# Patient Record
Sex: Male | Born: 1945
Health system: Southern US, Community
[De-identification: ages and names within clinical notes are randomized; demographics above are authoritative.]

## PROBLEM LIST (undated history)

## (undated) ENCOUNTER — Emergency Department (HOSPITAL_BASED_OUTPATIENT_CLINIC_OR_DEPARTMENT_OTHER): Admission: EM | Payer: Medicare HMO | Source: Home / Self Care

## (undated) DIAGNOSIS — K635 Polyp of colon: Secondary | ICD-10-CM

## (undated) DIAGNOSIS — I219 Acute myocardial infarction, unspecified: Secondary | ICD-10-CM

## (undated) DIAGNOSIS — K859 Acute pancreatitis without necrosis or infection, unspecified: Secondary | ICD-10-CM

## (undated) DIAGNOSIS — E039 Hypothyroidism, unspecified: Secondary | ICD-10-CM

## (undated) DIAGNOSIS — D649 Anemia, unspecified: Secondary | ICD-10-CM

## (undated) DIAGNOSIS — H269 Unspecified cataract: Secondary | ICD-10-CM

## (undated) DIAGNOSIS — I10 Essential (primary) hypertension: Secondary | ICD-10-CM

## (undated) DIAGNOSIS — K219 Gastro-esophageal reflux disease without esophagitis: Secondary | ICD-10-CM

## (undated) DIAGNOSIS — E785 Hyperlipidemia, unspecified: Secondary | ICD-10-CM

## (undated) DIAGNOSIS — K449 Diaphragmatic hernia without obstruction or gangrene: Secondary | ICD-10-CM

## (undated) DIAGNOSIS — K649 Unspecified hemorrhoids: Secondary | ICD-10-CM

## (undated) DIAGNOSIS — K8020 Calculus of gallbladder without cholecystitis without obstruction: Secondary | ICD-10-CM

## (undated) DIAGNOSIS — K5790 Diverticulosis of intestine, part unspecified, without perforation or abscess without bleeding: Secondary | ICD-10-CM

## (undated) DIAGNOSIS — R011 Cardiac murmur, unspecified: Secondary | ICD-10-CM

## (undated) DIAGNOSIS — C799 Secondary malignant neoplasm of unspecified site: Secondary | ICD-10-CM

## (undated) HISTORY — DX: Secondary malignant neoplasm of unspecified site: C79.9

## (undated) HISTORY — PX: TUMOR REMOVAL: SHX12

## (undated) HISTORY — PX: SMALL INTESTINE SURGERY: SHX150

## (undated) HISTORY — DX: Acute myocardial infarction, unspecified: I21.9

## (undated) HISTORY — DX: Acute pancreatitis without necrosis or infection, unspecified: K85.90

## (undated) HISTORY — PX: CATARACT EXTRACTION W/ INTRAOCULAR LENS  IMPLANT, BILATERAL: SHX1307

## (undated) HISTORY — DX: Gastro-esophageal reflux disease without esophagitis: K21.9

## (undated) HISTORY — DX: Unspecified cataract: H26.9

## (undated) HISTORY — DX: Unspecified hemorrhoids: K64.9

## (undated) HISTORY — DX: Hyperlipidemia, unspecified: E78.5

## (undated) HISTORY — DX: Diaphragmatic hernia without obstruction or gangrene: K44.9

## (undated) HISTORY — DX: Polyp of colon: K63.5

## (undated) HISTORY — DX: Calculus of gallbladder without cholecystitis without obstruction: K80.20

## (undated) HISTORY — DX: Hypothyroidism, unspecified: E03.9

## (undated) HISTORY — DX: Diverticulosis of intestine, part unspecified, without perforation or abscess without bleeding: K57.90

## (undated) HISTORY — DX: Essential (primary) hypertension: I10

## (undated) HISTORY — PX: RADIOACTIVE SEED IMPLANT: SHX5150

---
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
## 1996-11-01 DIAGNOSIS — K859 Acute pancreatitis without necrosis or infection, unspecified: Secondary | ICD-10-CM

## 1996-11-01 HISTORY — DX: Acute pancreatitis without necrosis or infection, unspecified: K85.90

## 1997-11-01 HISTORY — PX: LAPAROSCOPIC CHOLECYSTECTOMY: SUR755

## 2002-12-20 ENCOUNTER — Encounter: Admission: RE | Admit: 2002-12-20 | Discharge: 2002-12-20 | Payer: Self-pay | Admitting: Otolaryngology

## 2002-12-20 ENCOUNTER — Encounter: Payer: Self-pay | Admitting: Otolaryngology

## 2005-01-18 ENCOUNTER — Ambulatory Visit: Payer: Self-pay | Admitting: Internal Medicine

## 2005-01-25 ENCOUNTER — Ambulatory Visit: Payer: Self-pay | Admitting: Internal Medicine

## 2005-02-17 ENCOUNTER — Ambulatory Visit: Payer: Self-pay | Admitting: Internal Medicine

## 2005-03-09 ENCOUNTER — Ambulatory Visit: Payer: Self-pay | Admitting: Internal Medicine

## 2005-07-28 ENCOUNTER — Ambulatory Visit: Payer: Self-pay | Admitting: Internal Medicine

## 2005-12-15 ENCOUNTER — Ambulatory Visit: Payer: Self-pay | Admitting: Internal Medicine

## 2006-01-10 ENCOUNTER — Ambulatory Visit: Payer: Self-pay | Admitting: Internal Medicine

## 2006-01-12 ENCOUNTER — Ambulatory Visit: Payer: Self-pay | Admitting: Internal Medicine

## 2006-01-27 ENCOUNTER — Ambulatory Visit: Payer: Self-pay | Admitting: Internal Medicine

## 2006-02-28 ENCOUNTER — Ambulatory Visit: Payer: Self-pay | Admitting: Internal Medicine

## 2006-08-04 ENCOUNTER — Ambulatory Visit: Payer: Self-pay | Admitting: Internal Medicine

## 2006-08-05 ENCOUNTER — Ambulatory Visit: Payer: Self-pay | Admitting: Internal Medicine

## 2006-09-19 ENCOUNTER — Emergency Department (HOSPITAL_COMMUNITY): Admission: EM | Admit: 2006-09-19 | Discharge: 2006-09-19 | Payer: Self-pay | Admitting: Family Medicine

## 2006-09-30 ENCOUNTER — Ambulatory Visit: Payer: Self-pay | Admitting: Internal Medicine

## 2006-10-19 ENCOUNTER — Ambulatory Visit: Payer: Self-pay

## 2007-04-24 ENCOUNTER — Ambulatory Visit: Payer: Self-pay | Admitting: Internal Medicine

## 2007-07-17 ENCOUNTER — Ambulatory Visit: Payer: Self-pay | Admitting: Internal Medicine

## 2007-09-15 ENCOUNTER — Encounter: Payer: Self-pay | Admitting: Internal Medicine

## 2007-09-15 DIAGNOSIS — K219 Gastro-esophageal reflux disease without esophagitis: Secondary | ICD-10-CM | POA: Insufficient documentation

## 2007-09-15 DIAGNOSIS — I10 Essential (primary) hypertension: Secondary | ICD-10-CM | POA: Insufficient documentation

## 2007-09-15 DIAGNOSIS — R011 Cardiac murmur, unspecified: Secondary | ICD-10-CM | POA: Insufficient documentation

## 2007-09-15 DIAGNOSIS — R5381 Other malaise: Secondary | ICD-10-CM | POA: Insufficient documentation

## 2007-09-15 DIAGNOSIS — K861 Other chronic pancreatitis: Secondary | ICD-10-CM | POA: Insufficient documentation

## 2007-09-15 DIAGNOSIS — R0609 Other forms of dyspnea: Secondary | ICD-10-CM | POA: Insufficient documentation

## 2007-09-15 DIAGNOSIS — R5383 Other fatigue: Secondary | ICD-10-CM

## 2007-09-15 DIAGNOSIS — R0989 Other specified symptoms and signs involving the circulatory and respiratory systems: Secondary | ICD-10-CM

## 2007-12-08 ENCOUNTER — Ambulatory Visit: Payer: Self-pay | Admitting: Internal Medicine

## 2007-12-11 LAB — CONVERTED CEMR LAB
Alkaline Phosphatase: 42 units/L (ref 39–117)
BUN: 12 mg/dL (ref 6–23)
Basophils Relative: 0.8 % (ref 0.0–1.0)
Bilirubin Urine: NEGATIVE
Bilirubin, Direct: 0.2 mg/dL (ref 0.0–0.3)
CO2: 33 meq/L — ABNORMAL HIGH (ref 19–32)
Cholesterol: 206 mg/dL (ref 0–200)
Direct LDL: 147.6 mg/dL
Eosinophils Absolute: 0.3 10*3/uL (ref 0.0–0.6)
GFR calc Af Amer: 111 mL/min
GFR calc non Af Amer: 91 mL/min
HDL: 33.2 mg/dL — ABNORMAL LOW (ref >39.0)
Hemoglobin: 14.7 g/dL (ref 13.0–17.0)
Leukocytes, UA: NEGATIVE
Lymphocytes Relative: 26 % (ref 12.0–46.0)
MCHC: 33.1 g/dL (ref 30.0–36.0)
MCV: 85.6 fL (ref 78.0–100.0)
Monocytes Absolute: 0.4 10*3/uL (ref 0.2–0.7)
Monocytes Relative: 6.9 % (ref 3.0–11.0)
Neutro Abs: 3.9 10*3/uL (ref 1.4–7.7)
Platelets: 207 10*3/uL (ref 150–400)
Potassium: 4.2 meq/L (ref 3.5–5.1)
Specific Gravity, Urine: 1.015 (ref 1.000–1.03)
TSH: 6.24 microintl units/mL — ABNORMAL HIGH (ref 0.35–5.50)
Total Protein: 7 g/dL (ref 6.0–8.3)
Triglycerides: 157 mg/dL — ABNORMAL HIGH (ref 0–149)
Urine Glucose: NEGATIVE mg/dL

## 2007-12-13 ENCOUNTER — Ambulatory Visit: Payer: Self-pay | Admitting: Internal Medicine

## 2007-12-13 DIAGNOSIS — R143 Flatulence: Secondary | ICD-10-CM

## 2007-12-13 DIAGNOSIS — E039 Hypothyroidism, unspecified: Secondary | ICD-10-CM | POA: Insufficient documentation

## 2007-12-13 DIAGNOSIS — R142 Eructation: Secondary | ICD-10-CM | POA: Insufficient documentation

## 2007-12-13 DIAGNOSIS — K649 Unspecified hemorrhoids: Secondary | ICD-10-CM | POA: Insufficient documentation

## 2007-12-13 DIAGNOSIS — Z860100 Personal history of colon polyps, unspecified: Secondary | ICD-10-CM | POA: Insufficient documentation

## 2007-12-13 DIAGNOSIS — R141 Gas pain: Secondary | ICD-10-CM | POA: Insufficient documentation

## 2007-12-21 ENCOUNTER — Telehealth: Payer: Self-pay | Admitting: Internal Medicine

## 2008-01-08 ENCOUNTER — Telehealth: Payer: Self-pay | Admitting: Internal Medicine

## 2008-01-08 ENCOUNTER — Encounter: Payer: Self-pay | Admitting: Internal Medicine

## 2008-01-12 ENCOUNTER — Encounter: Payer: Self-pay | Admitting: Internal Medicine

## 2008-03-11 ENCOUNTER — Ambulatory Visit: Payer: Self-pay | Admitting: Internal Medicine

## 2008-03-11 LAB — CONVERTED CEMR LAB: TSH: 4.24 microintl units/mL (ref 0.35–5.50)

## 2008-03-12 ENCOUNTER — Ambulatory Visit: Payer: Self-pay | Admitting: Internal Medicine

## 2008-06-05 ENCOUNTER — Ambulatory Visit: Payer: Self-pay | Admitting: Internal Medicine

## 2008-06-05 DIAGNOSIS — R21 Rash and other nonspecific skin eruption: Secondary | ICD-10-CM | POA: Insufficient documentation

## 2008-07-10 ENCOUNTER — Encounter: Payer: Self-pay | Admitting: Internal Medicine

## 2008-07-16 ENCOUNTER — Encounter: Payer: Self-pay | Admitting: Internal Medicine

## 2008-07-31 ENCOUNTER — Encounter: Payer: Self-pay | Admitting: Internal Medicine

## 2008-08-12 ENCOUNTER — Ambulatory Visit: Payer: Self-pay | Admitting: Internal Medicine

## 2008-08-28 ENCOUNTER — Encounter: Payer: Self-pay | Admitting: Internal Medicine

## 2008-09-06 ENCOUNTER — Telehealth: Payer: Self-pay | Admitting: Internal Medicine

## 2008-10-02 ENCOUNTER — Ambulatory Visit: Payer: Self-pay | Admitting: Internal Medicine

## 2008-10-02 DIAGNOSIS — R109 Unspecified abdominal pain: Secondary | ICD-10-CM | POA: Insufficient documentation

## 2008-10-04 ENCOUNTER — Ambulatory Visit: Payer: Self-pay | Admitting: Internal Medicine

## 2008-10-04 LAB — CONVERTED CEMR LAB
Albumin: 3.8 g/dL (ref 3.5–5.2)
BUN: 16 mg/dL (ref 6–23)
Bacteria, UA: NEGATIVE
Calcium: 9.6 mg/dL (ref 8.4–10.5)
Creatinine, Ser: 0.8 mg/dL (ref 0.4–1.5)
Crystals: NEGATIVE
GFR calc Af Amer: 126 mL/min
Glucose, Bld: 116 mg/dL — ABNORMAL HIGH (ref 70–99)
Hemoglobin, Urine: NEGATIVE
Ketones, ur: 15 mg/dL — AB
Lipase: 25 units/L (ref 11.0–59.0)
Sed Rate: 29 mm/hr — ABNORMAL HIGH (ref 0–16)
Total Protein: 7.8 g/dL (ref 6.0–8.3)
Urine Glucose: NEGATIVE mg/dL
Urobilinogen, UA: 0.2 (ref 0.0–1.0)

## 2008-10-07 ENCOUNTER — Encounter (INDEPENDENT_AMBULATORY_CARE_PROVIDER_SITE_OTHER): Payer: Self-pay | Admitting: *Deleted

## 2008-10-08 ENCOUNTER — Encounter: Admission: RE | Admit: 2008-10-08 | Discharge: 2008-10-08 | Payer: Self-pay | Admitting: Internal Medicine

## 2008-11-01 DIAGNOSIS — C799 Secondary malignant neoplasm of unspecified site: Secondary | ICD-10-CM

## 2008-11-01 HISTORY — DX: Secondary malignant neoplasm of unspecified site: C79.9

## 2008-11-01 HISTORY — PX: LIVER BIOPSY: SHX301

## 2008-11-08 DIAGNOSIS — K5730 Diverticulosis of large intestine without perforation or abscess without bleeding: Secondary | ICD-10-CM | POA: Insufficient documentation

## 2008-11-11 ENCOUNTER — Ambulatory Visit: Payer: Self-pay | Admitting: Internal Medicine

## 2008-11-11 DIAGNOSIS — R1013 Epigastric pain: Secondary | ICD-10-CM | POA: Insufficient documentation

## 2008-11-13 ENCOUNTER — Telehealth (INDEPENDENT_AMBULATORY_CARE_PROVIDER_SITE_OTHER): Payer: Self-pay | Admitting: *Deleted

## 2008-11-13 ENCOUNTER — Ambulatory Visit (HOSPITAL_COMMUNITY): Admission: RE | Admit: 2008-11-13 | Discharge: 2008-11-13 | Payer: Self-pay | Admitting: Internal Medicine

## 2008-11-13 IMAGING — US US ABDOMEN COMPLETE
1 series · 13 of 25 positions shown · non-contrast
Comparison: None

CLINICAL DATA: Epigastric abdominal pain.  History of
cholecystectomy and pancreatic pseudocyst.

ABDOMEN ULTRASOUND
TECHNIQUE: Complete abdominal ultrasound examination was performed
including evaluation of the liver, gallbladder, bile ducts,
pancreas, kidneys, spleen, IVC, and abdominal aorta.

[Series 1: us abdomen complete · 0.30mm/px · 13 of 88 slices shown]
[im 1/88]
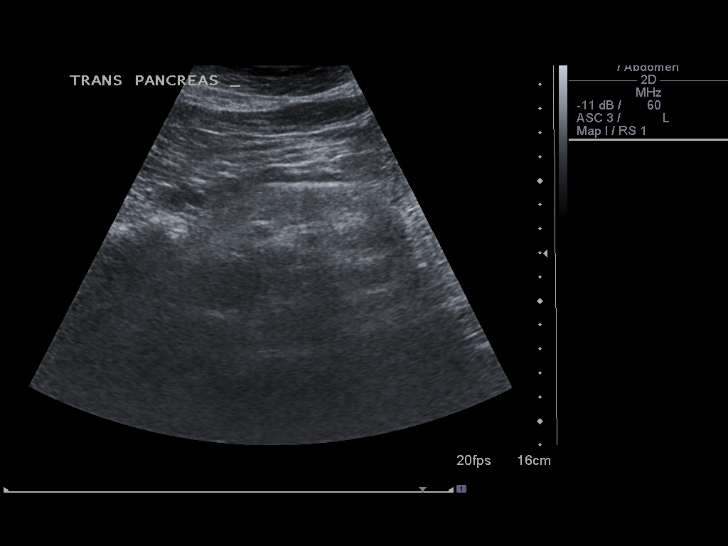
[im 8/88]
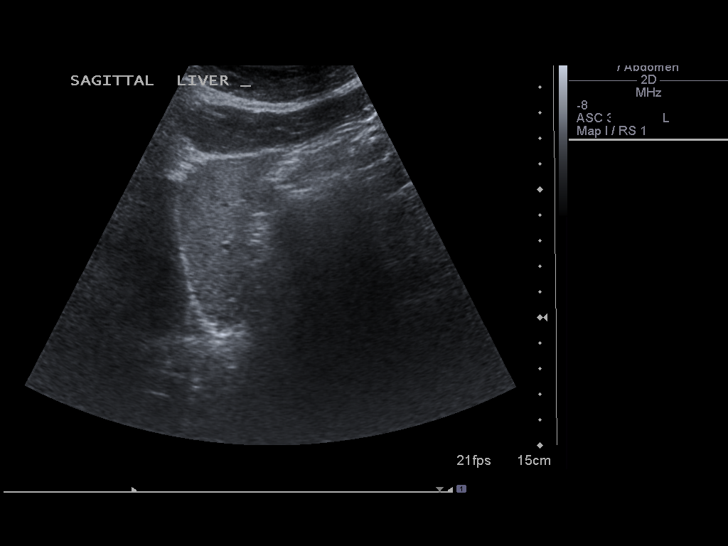
[im 15/88]
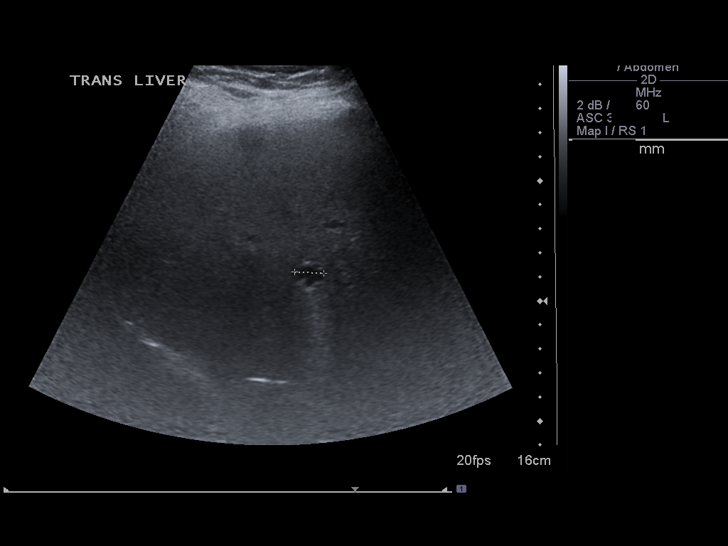
[im 22/88]
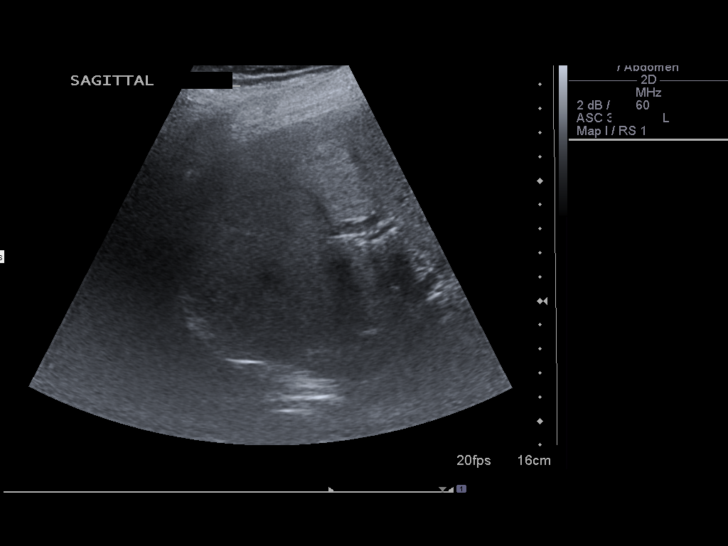
[im 30/88]
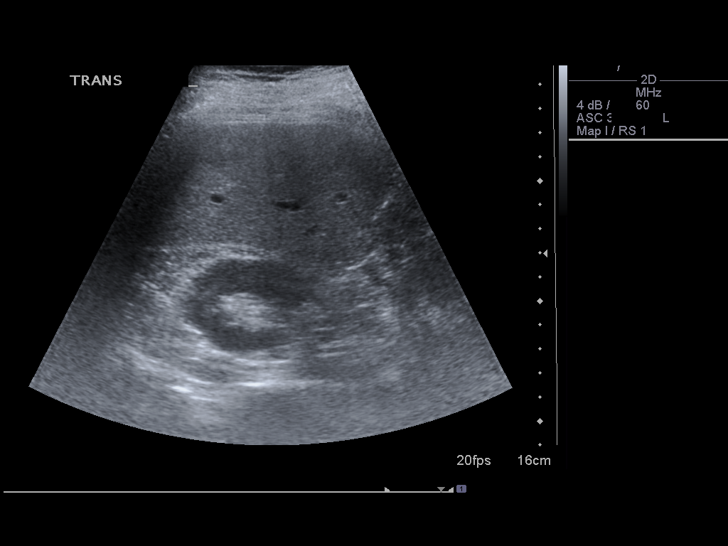
[im 37/88]
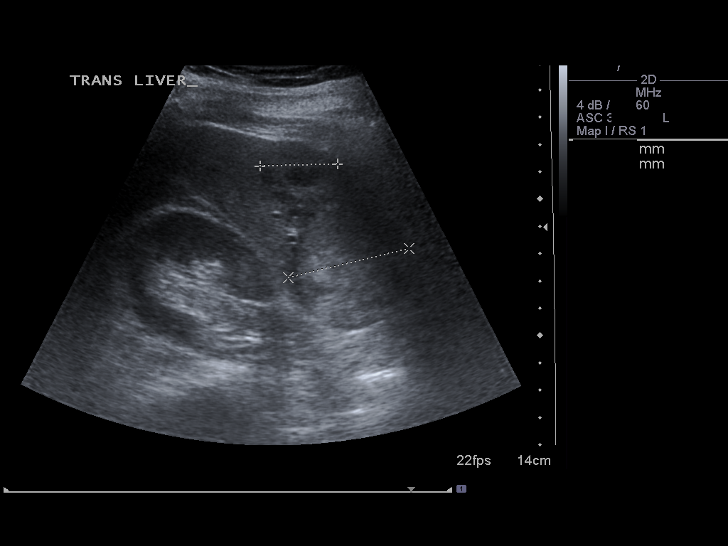
[im 44/88]
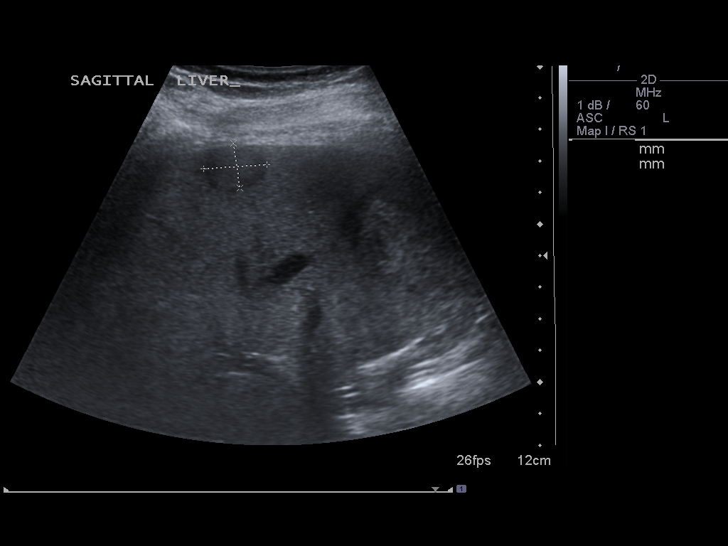
[im 51/88]
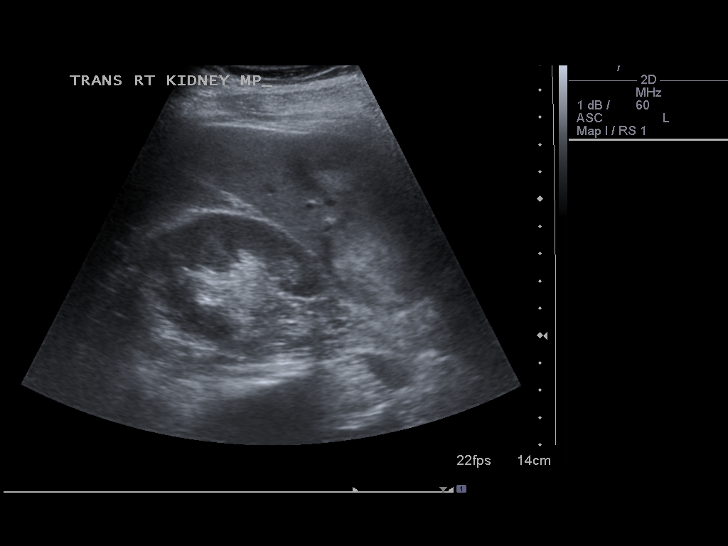
[im 59/88]
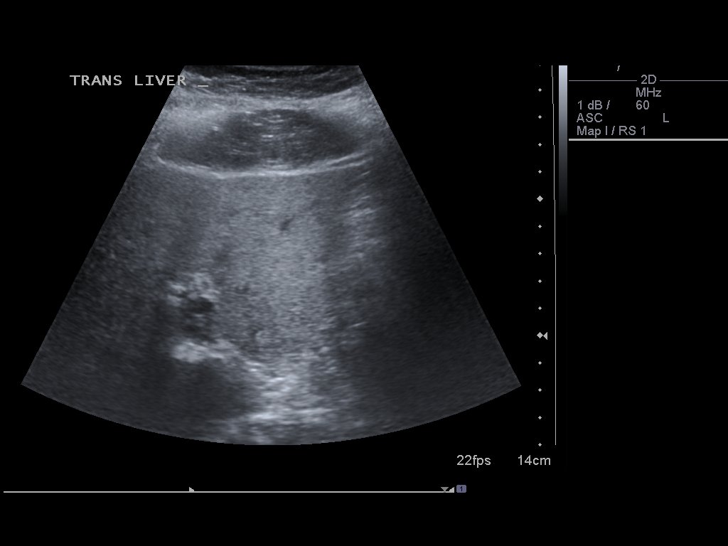
[im 66/88]
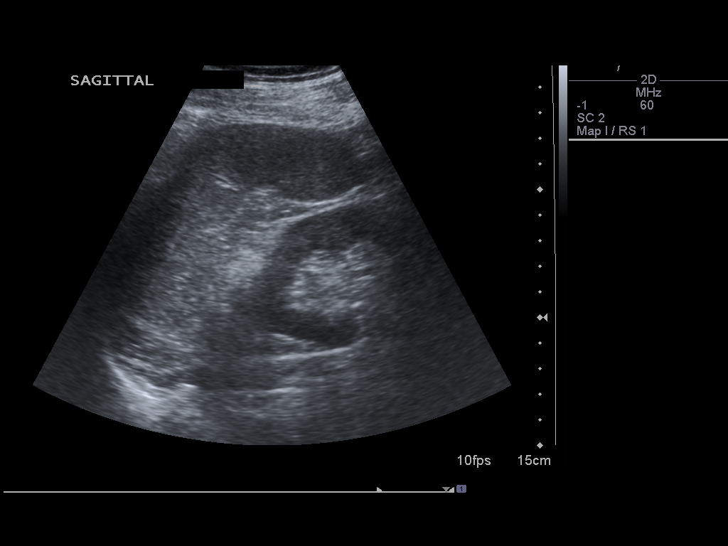
[im 73/88]
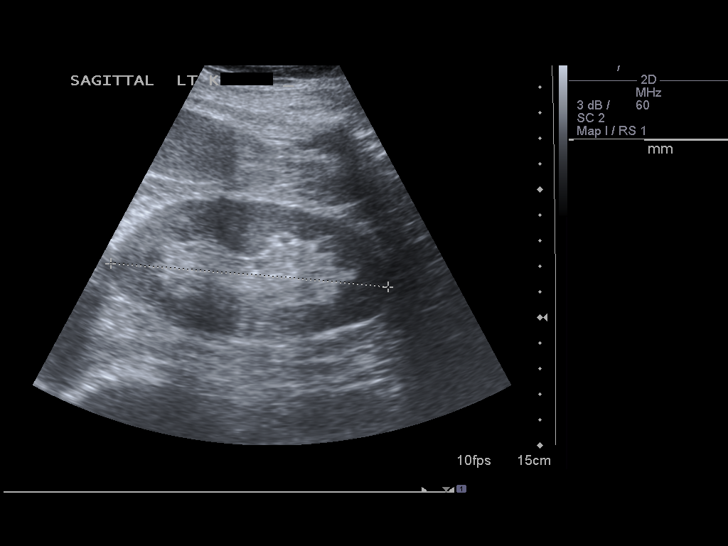
[im 80/88]
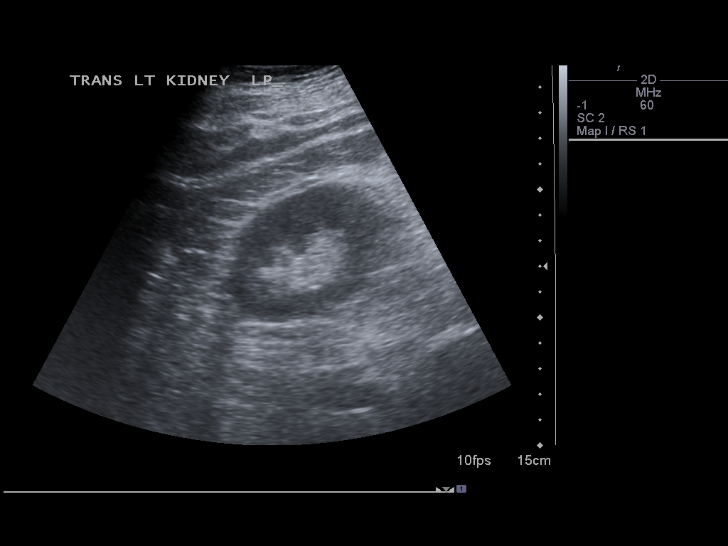
[im 88/88]
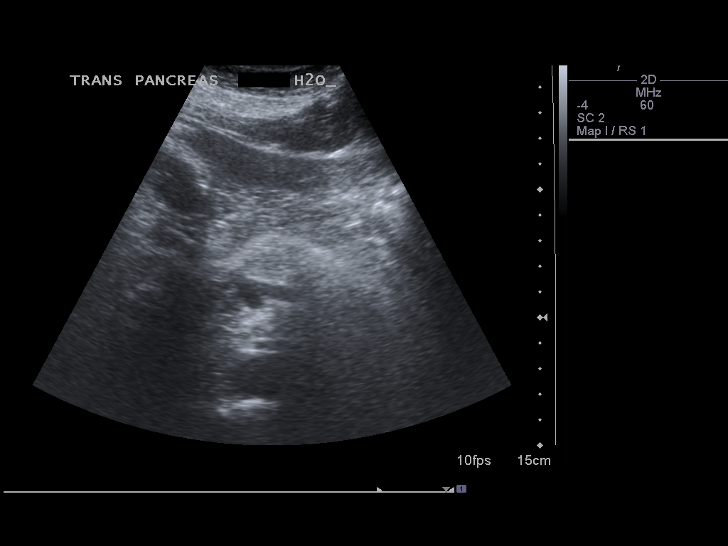

[13 of 25 positions shown; findings below may reference images not displayed]

FINDINGS: Gallbladder:  Surgically removed.

Common bile duct:  Normal caliber of 5 mm

Liver:  Multiple solid masses are present in the liver.  At least
three are definable and measurable by ultrasound.  The largest lies
in the inferior right lobe of the liver and measures 4.9 x 4.2 x
4.6 cm.  An adjacent lesion measures approximately 3.5 x 2.4 x
cm.  A third solid tumor is located near the junction of left and
right lobes anteriorly and measures approximately 2.0 x 1.4 x
cm.  A single small septated cyst is identified in the deep right
lobe measuring 1.2 x 1.0 x 1.2 cm.

IVC:  Normally patent

Pancreas:  Very limited visualization by ultrasound.  No obvious
pancreatic masses or fluid collections.

Spleen:  Normal by ultrasound.  Estimated length of 9 cm.

Kidneys:  The right kidney measures 10.1 cm and the left kidney
10.9 cm.  Both have a normal sonographic appearance.

Abdominal aorta:  Normal caliber.
IMPRESSION: At least three solid masses are identified in the liver as above.
Findings are most concerning for metastatic disease.  Very limited
visualization of the pancreas.  Findings were directly communicated
to Dr. TOLLEIV nurse at the time of interpretation.

## 2008-11-14 ENCOUNTER — Encounter: Payer: Self-pay | Admitting: Internal Medicine

## 2008-11-14 DIAGNOSIS — R933 Abnormal findings on diagnostic imaging of other parts of digestive tract: Secondary | ICD-10-CM | POA: Insufficient documentation

## 2008-11-15 ENCOUNTER — Telehealth: Payer: Self-pay | Admitting: Internal Medicine

## 2008-11-18 ENCOUNTER — Ambulatory Visit (HOSPITAL_COMMUNITY): Admission: RE | Admit: 2008-11-18 | Discharge: 2008-11-18 | Payer: Self-pay | Admitting: Internal Medicine

## 2008-11-18 IMAGING — CT CT PELVIS W/ CM
2 of 5 series · 16 of 46 positions shown, 18 images · IV contrast (agent unspecified)
Comparison: Liver sonogram dated [DATE]

CT CHEST

CLINICAL DATA: Abnormal sonogram of the liver

CT CHEST, ABDOMEN AND PELVIS WITH CONTRAST
TECHNIQUE: Multidetector CT imaging of the chest, abdomen and
pelvis was performed following the standard protocol during bolus
administration of intravenous contrast.
Contrast: 100 ml [UA]

[Series 2: cap 5.0 b40f · axial · 0.75mm/px · z∈[-623,-63]mm · 13 of 128 slices shown, 15 images]
[im 8/128  soft-tissue]
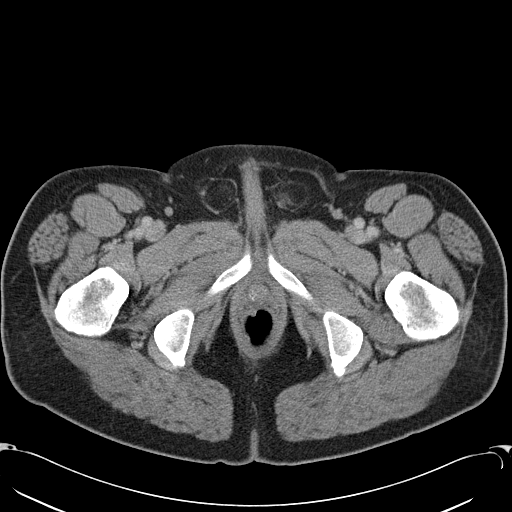
[im 8/128  bone]
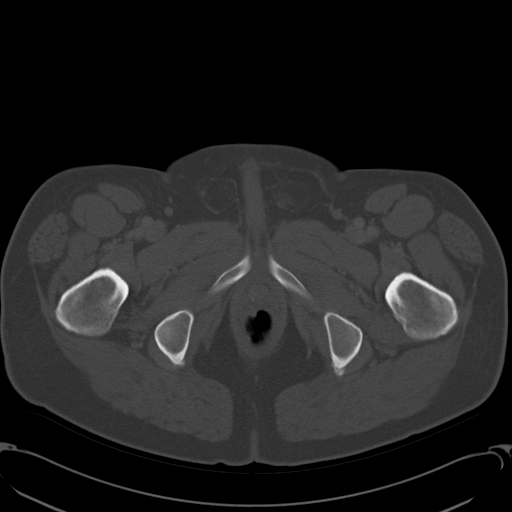
[im 16/128  soft-tissue]
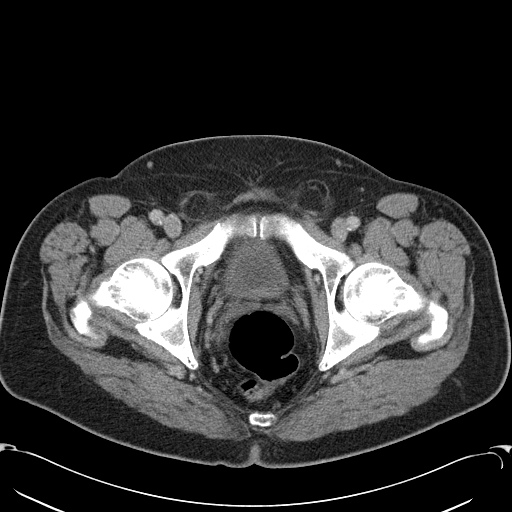
[im 24/128  soft-tissue]
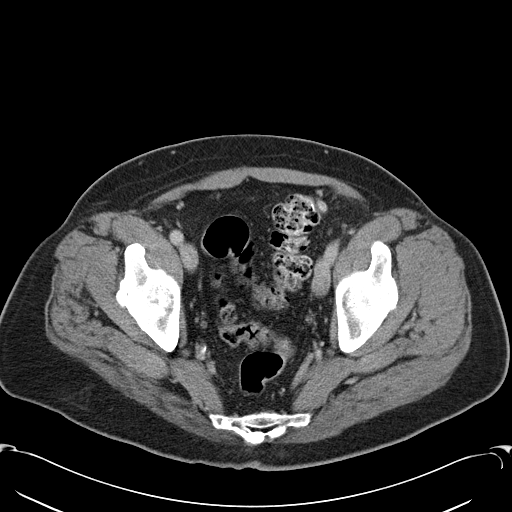
[im 40/128  soft-tissue]
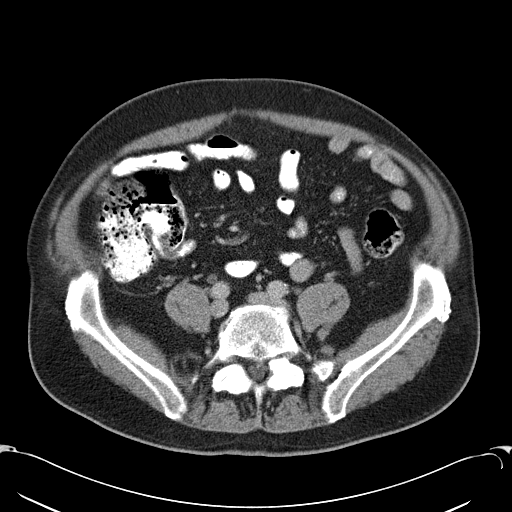
[im 48/128  soft-tissue]
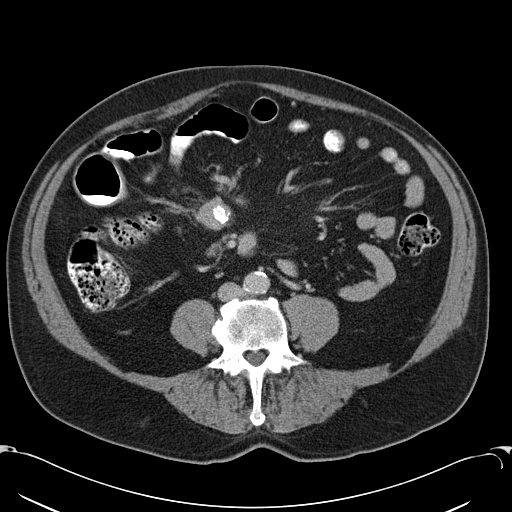
[im 56/128  soft-tissue]
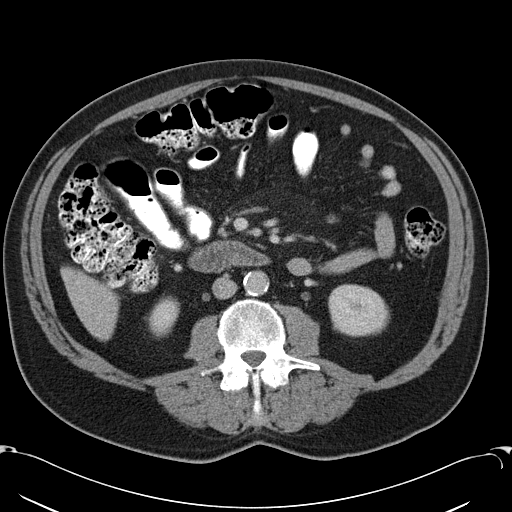
[im 64/128  soft-tissue]
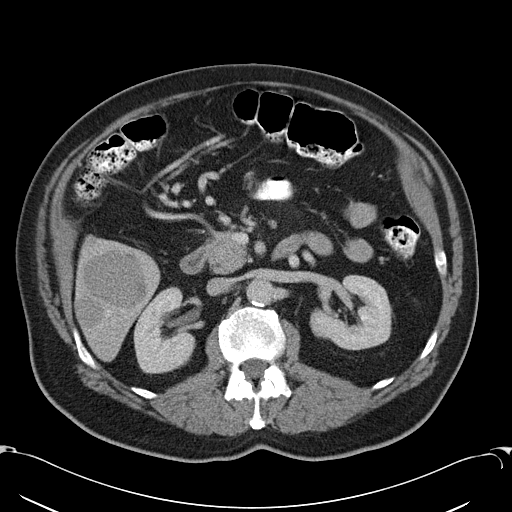
[im 72/128  soft-tissue]
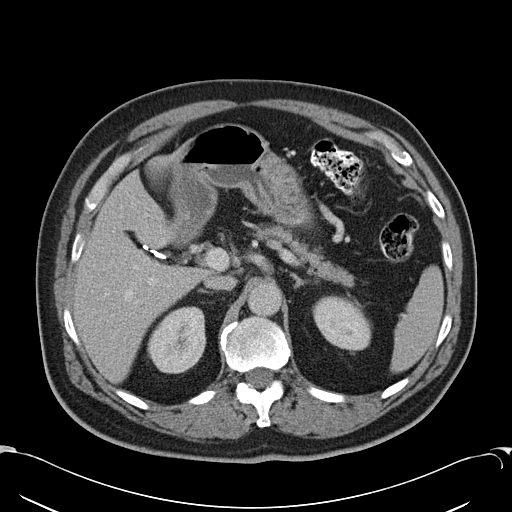
[im 80/128  soft-tissue]
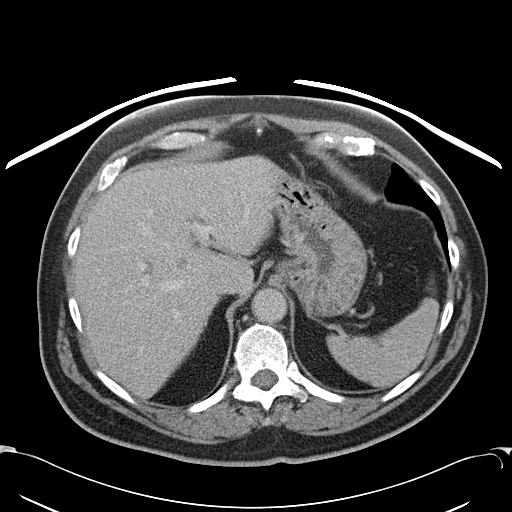
[im 80/128  bone]
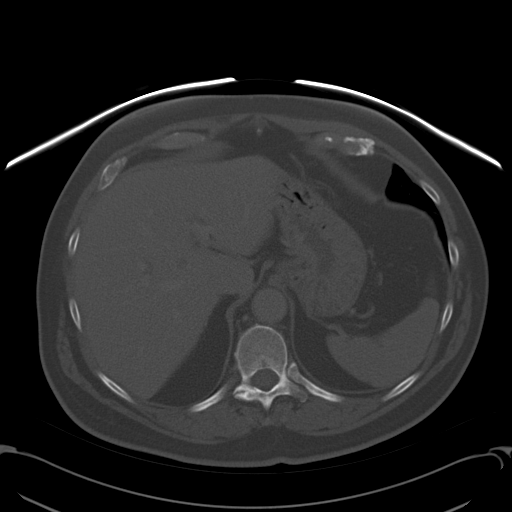
[im 88/128  soft-tissue]
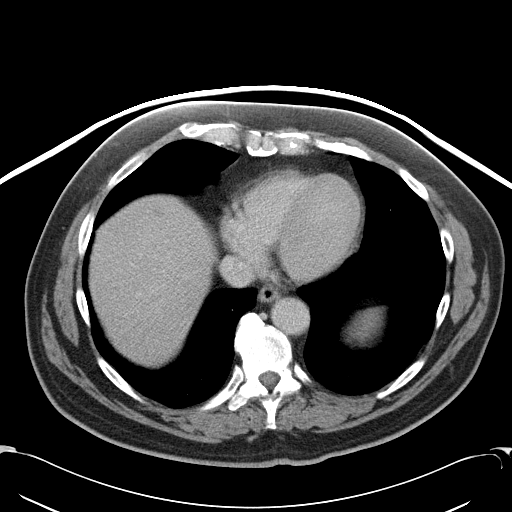
[im 104/128  soft-tissue]
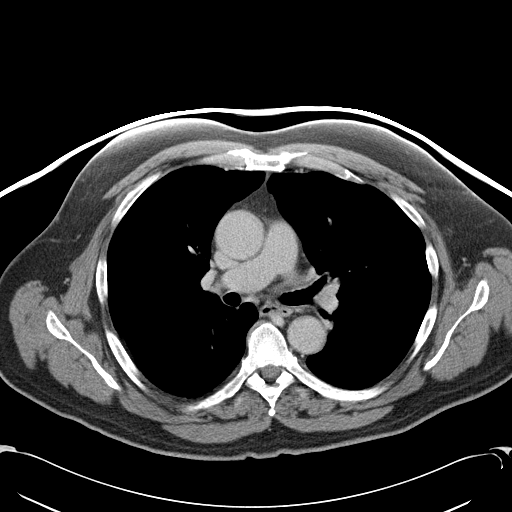
[im 112/128  soft-tissue]
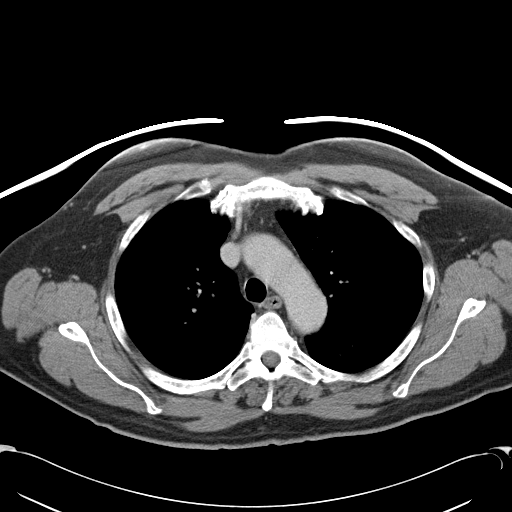
[im 120/128  soft-tissue]
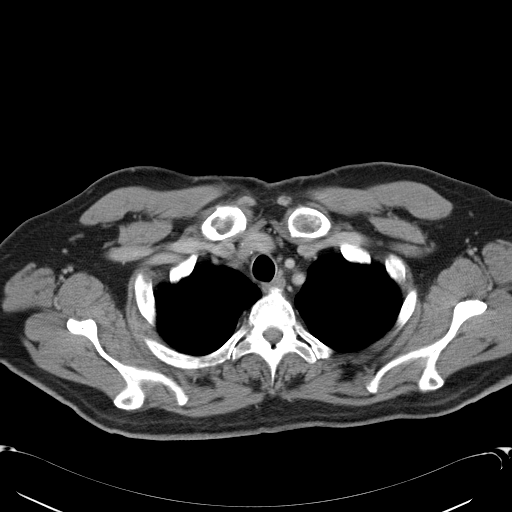

[Series 602: <mpr thick range> · coronal · 1.25mm/px · 3 of 94 slices shown]
[im 32/94  soft-tissue]
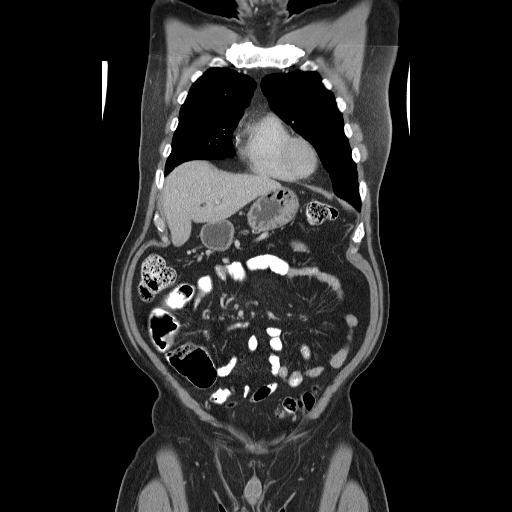
[im 42/94  soft-tissue]
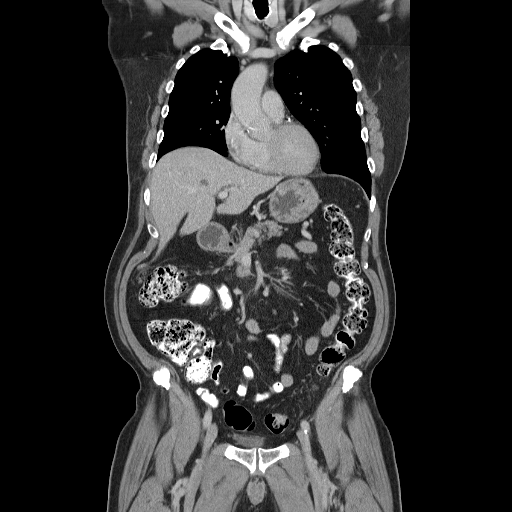
[im 52/94  soft-tissue]
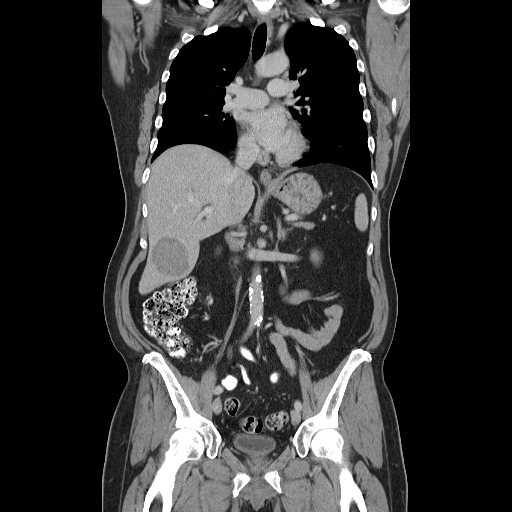

[16 of 46 positions shown; findings below may reference images not displayed]

FINDINGS: Negative abnormal mediastinal adenopathy.  Small
prevascular nodes are seen.  Negative pericardial effusion.  Mildly
prominent coronary artery calcifications.

No pneumothorax or effusion.

6 mm left upper lobe pulmonary nodule with a curvilinear
calcification.  Sclerotic focus in the left rib laterally on image
39.
IMPRESSION: 6 mm left upper lobe pulmonary nodule.

Sclerotic lesion in the left rib.

CT ABDOMEN
FINDINGS: Innumerable liver lesions are seen.  The largest on the
right lobe, in the posterior segment, and inferiorly.  They measure
5.1 x 4.1 cm and 2.4 by 2.2 cm on image 65.  Other smaller liver
lesions are also present the spleen, pancreas, adrenal glands are
within normal limits.  The gallbladder is absent surgically.
Kidneys are unremarkable.

There is a 2.6 x 2.9 cm solid mass in the mesentery with central
irregular calcification on image 80.  Adjacent small mesenteric
lymph nodes are present.

On image 86, there is a segment of small bowel with focal wall
thickening that is circumferential.  Underlying small bowel
neoplasm is not excluded.

Negative free fluid.  No free intraperitoneal gas.  Degenerative
disc disease is noted.
IMPRESSION: The constellation of findings above are worrisome for neoplasm.  Of
a include a suspected small bowel lesion, a mesenteric mass, and
multiple liver lesions.  Differential diagnosis would include
neuroendocrine tumor and adenocarcinoma.

CT PELVIS
FINDINGS: The bladder is decompressed.  Negative free fluid or
abnormal adenopathy.  Bilateral inguinal hernias containing fat
only adipose tissue are noted.
IMPRESSION: No evidence of metastatic disease in the pelvis.

## 2008-11-21 ENCOUNTER — Encounter: Payer: Self-pay | Admitting: Internal Medicine

## 2008-11-26 ENCOUNTER — Encounter: Payer: Self-pay | Admitting: Internal Medicine

## 2008-11-26 ENCOUNTER — Ambulatory Visit (HOSPITAL_COMMUNITY): Admission: RE | Admit: 2008-11-26 | Discharge: 2008-11-26 | Payer: Self-pay | Admitting: Internal Medicine

## 2008-11-26 IMAGING — US US BIOPSY
1 series · 12 of 12 positions shown · non-contrast
Comparison: none

CLINICAL DATA: 62-year-old male abdominal ultrasound demonstrating
solid liver lesions, this was confirmed with a abdominal CT
including contrast.  The appearance is suspicious for  metastatic
disease to the liver.

[Series 1: unknown · 0.28mm/px · 12 of 12 slices shown]
[im 1/12]
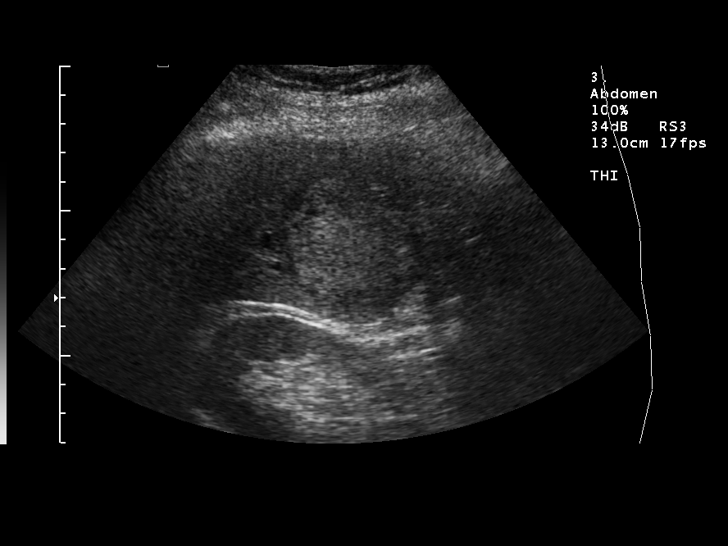
[im 2/12]
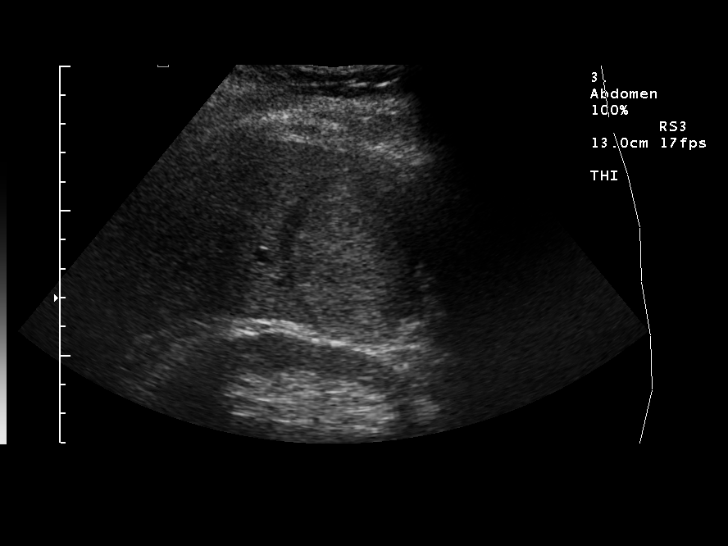
[im 3/12]
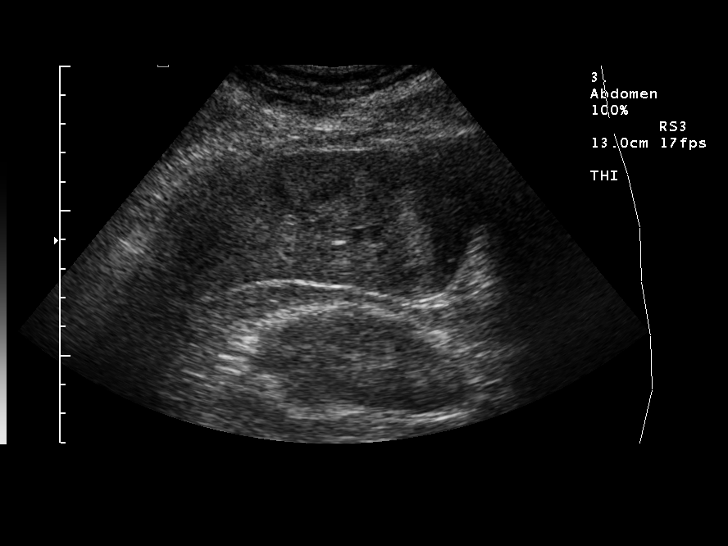
[im 4/12]
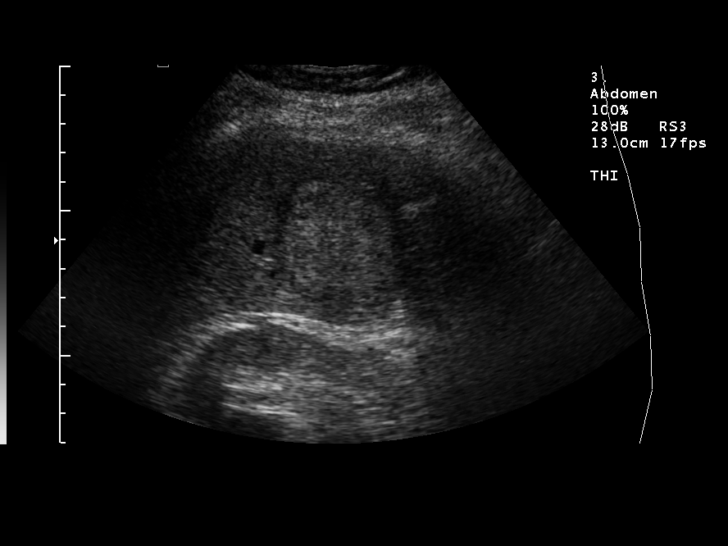
[im 5/12]
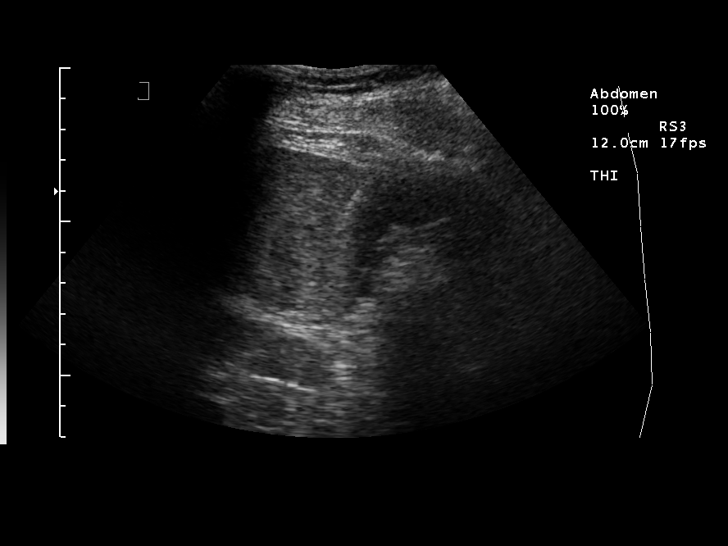
[im 6/12]
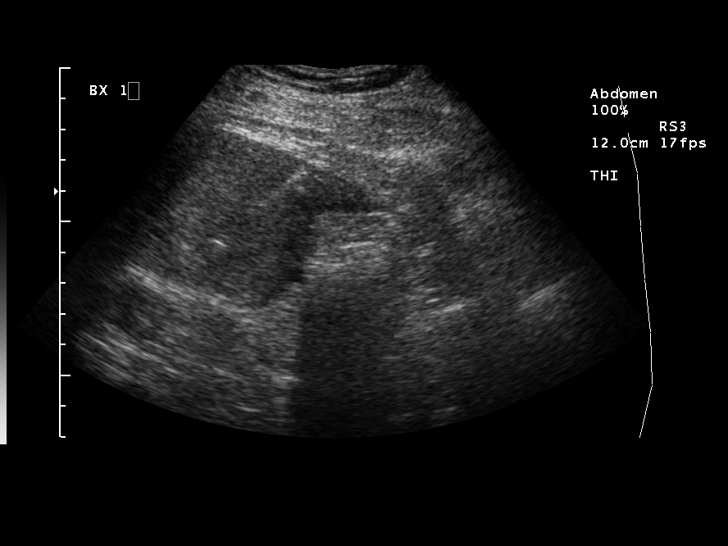
[im 7/12]
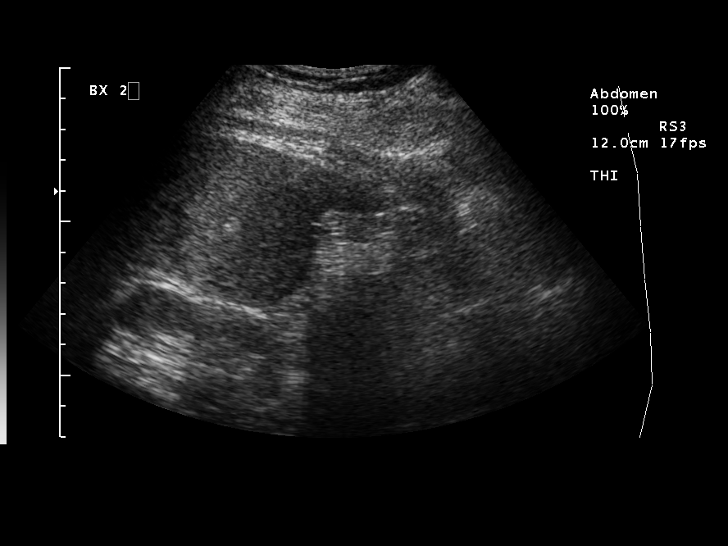
[im 8/12]
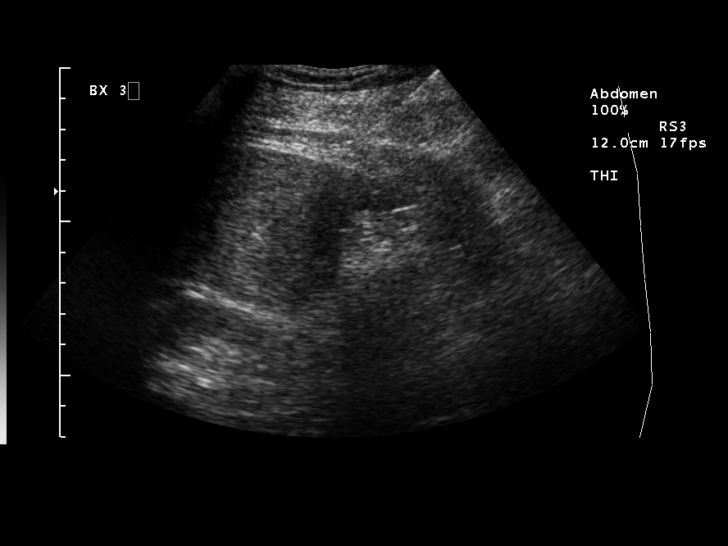
[im 9/12]
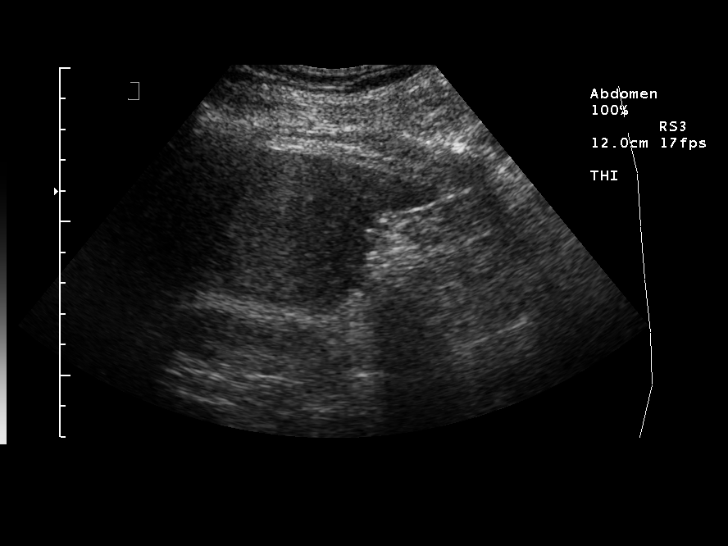
[im 10/12]
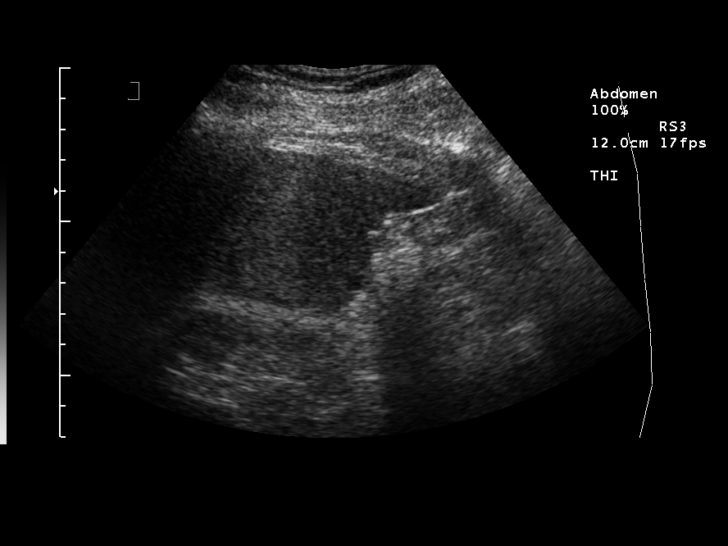
[im 11/12]
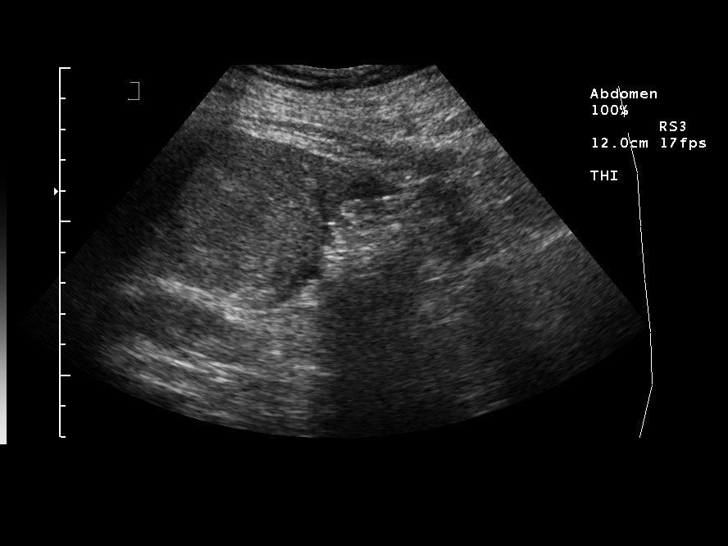
[im 12/12]
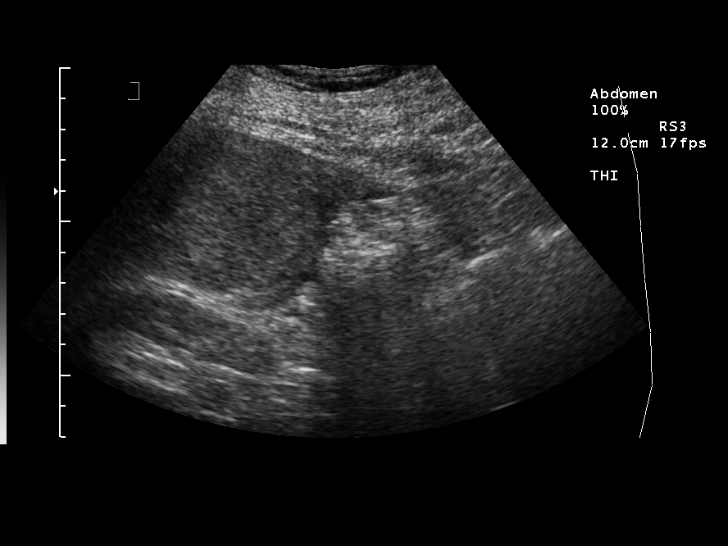

[12 of 12 positions shown; findings below may reference images not displayed]

ULTRASOUND GUIDED LEFT LIVER MASS CORE BIOPSY

Date:  [DATE] [DATE]

Radiologist:  ADHEM, M.D.

Medications:  1 mg Versed, 75 mcg Fentanyl

Guidance:  Ultrasound

Sedation time:  13 minutes

Complications:  No immediate

PROCEDURE/FINDINGS:

Informed consent was obtained from the patient following
explanation of the procedure, risks, benefits and alternatives.
The patient understands, agrees and consents for the procedure.
All questions were addressed.  A time out was performed.

Preliminary ultrasound was performed of the right hepatic lobe
inferiorly.  A 5 cm solid echogenic liver lesion is identified.
This correlates with a dominant  liver lesion on the CT.  Under
sterile conditions and local anesthesia, a 17 gauge coaxial guiding
needle was advanced into the liver lesion across normal liver
parenchyma initially.  Needle position was confirmed with
ultrasound.  Images were obtained for documentation.  Through the
access, a 3 18 gauge core biopsies were obtained and placed in
saline.  Post procedure imaging demonstrated no evidence of liver
hemorrhage or peri hepatic fluid collection.  The patient tolerated
the procedure well.  No immediate complication.
IMPRESSION: Ultrasound guided right hepatic mass core biopsy (3 18 gauge core
biopsies).

## 2008-12-03 ENCOUNTER — Telehealth: Payer: Self-pay | Admitting: Internal Medicine

## 2008-12-04 ENCOUNTER — Ambulatory Visit: Payer: Self-pay | Admitting: Hematology & Oncology

## 2008-12-05 ENCOUNTER — Encounter: Payer: Self-pay | Admitting: Internal Medicine

## 2008-12-05 LAB — CBC WITH DIFFERENTIAL (CANCER CENTER ONLY)
BASO%: 0.4 % (ref 0.0–2.0)
EOS%: 2.5 % (ref 0.0–7.0)
HCT: 41.7 % (ref 38.7–49.9)
LYMPH#: 1.9 10*3/uL (ref 0.9–3.3)
MCHC: 33.4 g/dL (ref 32.0–35.9)
NEUT%: 67.4 % (ref 40.0–80.0)
Platelets: 162 10*3/uL (ref 145–400)
RDW: 13.1 % (ref 10.5–14.6)
WBC: 7.1 10*3/uL (ref 4.0–10.0)

## 2008-12-10 LAB — COMPREHENSIVE METABOLIC PANEL
Alkaline Phosphatase: 47 U/L (ref 39–117)
BUN: 14 mg/dL (ref 6–23)
CO2: 26 mEq/L (ref 19–32)
Creatinine, Ser: 0.78 mg/dL (ref 0.40–1.50)
Glucose, Bld: 112 mg/dL — ABNORMAL HIGH (ref 70–99)
Total Bilirubin: 0.5 mg/dL (ref 0.3–1.2)

## 2008-12-10 LAB — LACTATE DEHYDROGENASE: LDH: 154 U/L (ref 94–250)

## 2008-12-10 LAB — PREALBUMIN: Prealbumin: 30.2 mg/dL (ref 18.0–45.0)

## 2008-12-13 LAB — 5 HIAA, QUANTITATIVE, URINE, 24 HOUR
5-HIAA, 24 Hr Urine: 38.8 mg/24 h — ABNORMAL HIGH (ref <=6.0)
Volume, Urine-5HIAA: 1550 mL/24 h

## 2008-12-23 ENCOUNTER — Ambulatory Visit: Payer: Self-pay | Admitting: Internal Medicine

## 2008-12-23 DIAGNOSIS — D499 Neoplasm of unspecified behavior of unspecified site: Secondary | ICD-10-CM | POA: Insufficient documentation

## 2009-01-30 HISTORY — PX: TUMOR EXCISION: SHX421

## 2009-02-15 ENCOUNTER — Encounter: Payer: Self-pay | Admitting: Internal Medicine

## 2009-02-19 ENCOUNTER — Encounter: Payer: Self-pay | Admitting: Internal Medicine

## 2009-02-19 ENCOUNTER — Ambulatory Visit: Payer: Self-pay | Admitting: Hematology & Oncology

## 2009-02-19 LAB — CBC WITH DIFFERENTIAL (CANCER CENTER ONLY)
BASO%: 0.4 % (ref 0.0–2.0)
EOS%: 3.1 % (ref 0.0–7.0)
HCT: 36.6 % — ABNORMAL LOW (ref 38.7–49.9)
LYMPH%: 17.7 % (ref 14.0–48.0)
MCH: 29 pg (ref 28.0–33.4)
MCHC: 33.7 g/dL (ref 32.0–35.9)
MCV: 86 fL (ref 82–98)
MONO#: 0.5 10*3/uL (ref 0.1–0.9)
MONO%: 4.9 % (ref 0.0–13.0)
NEUT%: 73.9 % (ref 40.0–80.0)
Platelets: 383 10*3/uL (ref 145–400)
RDW: 11 % (ref 10.5–14.6)

## 2009-03-03 LAB — COMPREHENSIVE METABOLIC PANEL
ALT: 27 U/L (ref 0–53)
AST: 17 U/L (ref 0–37)
Albumin: 3.5 g/dL (ref 3.5–5.2)
Alkaline Phosphatase: 55 U/L (ref 39–117)
Calcium: 9.5 mg/dL (ref 8.4–10.5)
Chloride: 102 mEq/L (ref 96–112)
Potassium: 4.4 mEq/L (ref 3.5–5.3)

## 2009-03-03 LAB — CHROMOGRANIN A: Chromogranin A: 27.4 ng/mL (ref <=36.4)

## 2009-03-03 LAB — LACTATE DEHYDROGENASE: LDH: 146 U/L (ref 94–250)

## 2009-03-19 ENCOUNTER — Encounter: Payer: Self-pay | Admitting: Internal Medicine

## 2009-03-19 LAB — CBC WITH DIFFERENTIAL (CANCER CENTER ONLY)
BASO#: 0 10*3/uL (ref 0.0–0.2)
Eosinophils Absolute: 0.5 10*3/uL (ref 0.0–0.5)
HCT: 39.6 % (ref 38.7–49.9)
LYMPH%: 25.7 % (ref 14.0–48.0)
MCH: 28.5 pg (ref 28.0–33.4)
MCV: 85 fL (ref 82–98)
MONO#: 0.3 10*3/uL (ref 0.1–0.9)
MONO%: 4.2 % (ref 0.0–13.0)
NEUT%: 62.2 % (ref 40.0–80.0)
RBC: 4.65 10*6/uL (ref 4.20–5.70)
WBC: 6.8 10*3/uL (ref 4.0–10.0)

## 2009-03-25 LAB — COMPREHENSIVE METABOLIC PANEL
BUN: 15 mg/dL (ref 6–23)
CO2: 30 mEq/L (ref 19–32)
Calcium: 9.9 mg/dL (ref 8.4–10.5)
Chloride: 101 mEq/L (ref 96–112)
Creatinine, Ser: 0.99 mg/dL (ref 0.40–1.50)
Glucose, Bld: 107 mg/dL — ABNORMAL HIGH (ref 70–99)
Total Bilirubin: 0.6 mg/dL (ref 0.3–1.2)

## 2009-03-25 LAB — LACTATE DEHYDROGENASE: LDH: 169 U/L (ref 94–250)

## 2009-03-25 LAB — CHROMOGRANIN A: Chromogranin A: 13.8 ng/mL (ref <=36.4)

## 2009-04-01 ENCOUNTER — Encounter: Payer: Self-pay | Admitting: Internal Medicine

## 2009-04-15 ENCOUNTER — Ambulatory Visit: Payer: Self-pay | Admitting: Hematology & Oncology

## 2009-06-11 ENCOUNTER — Ambulatory Visit: Payer: Self-pay | Admitting: Hematology & Oncology

## 2009-06-11 ENCOUNTER — Encounter: Payer: Self-pay | Admitting: Internal Medicine

## 2009-06-11 LAB — CBC WITH DIFFERENTIAL (CANCER CENTER ONLY)
BASO#: 0 10*3/uL (ref 0.0–0.2)
Eosinophils Absolute: 0.5 10*3/uL (ref 0.0–0.5)
HCT: 39 % (ref 38.7–49.9)
LYMPH%: 27 % (ref 14.0–48.0)
MCH: 27.8 pg — ABNORMAL LOW (ref 28.0–33.4)
MCV: 84 fL (ref 82–98)
MONO#: 0.3 10*3/uL (ref 0.1–0.9)
MONO%: 4.9 % (ref 0.0–13.0)
NEUT%: 60.2 % (ref 40.0–80.0)
Platelets: 213 10*3/uL (ref 145–400)
RBC: 4.63 10*6/uL (ref 4.20–5.70)
WBC: 6.9 10*3/uL (ref 4.0–10.0)

## 2009-06-16 ENCOUNTER — Ambulatory Visit: Payer: Self-pay | Admitting: Internal Medicine

## 2009-06-16 LAB — CONVERTED CEMR LAB
ALT: 22 units/L (ref 0–53)
AST: 28 units/L (ref 0–37)
Albumin: 3.4 g/dL — ABNORMAL LOW (ref 3.5–5.2)
Basophils Absolute: 0.1 10*3/uL (ref 0.0–0.1)
Basophils Relative: 0.8 % (ref 0.0–3.0)
Bilirubin Urine: NEGATIVE
CO2: 32 meq/L (ref 19–32)
Calcium: 9 mg/dL (ref 8.4–10.5)
Creatinine, Ser: 0.8 mg/dL (ref 0.4–1.5)
Eosinophils Relative: 7.8 % — ABNORMAL HIGH (ref 0.0–5.0)
HCT: 40 % (ref 39.0–52.0)
Hemoglobin: 13.5 g/dL (ref 13.0–17.0)
Ketones, ur: NEGATIVE mg/dL
Lymphs Abs: 1.5 10*3/uL (ref 0.7–4.0)
Monocytes Relative: 5.3 % (ref 3.0–12.0)
Neutro Abs: 4.2 10*3/uL (ref 1.4–7.7)
RDW: 14.1 % (ref 11.5–14.6)
TSH: 4.17 microintl units/mL (ref 0.35–5.50)
Total CHOL/HDL Ratio: 5
Total Protein: 7.7 g/dL (ref 6.0–8.3)
Triglycerides: 137 mg/dL (ref 0.0–149.0)
Urine Glucose: NEGATIVE mg/dL
Urobilinogen, UA: 0.2 (ref 0.0–1.0)

## 2009-06-17 LAB — CONVERTED CEMR LAB: Vit D, 25-Hydroxy: 29 ng/mL — ABNORMAL LOW (ref 30–89)

## 2009-06-19 ENCOUNTER — Ambulatory Visit: Payer: Self-pay | Admitting: Internal Medicine

## 2009-06-19 DIAGNOSIS — E559 Vitamin D deficiency, unspecified: Secondary | ICD-10-CM | POA: Insufficient documentation

## 2009-06-27 LAB — COMPREHENSIVE METABOLIC PANEL
AST: 22 U/L (ref 0–37)
BUN: 12 mg/dL (ref 6–23)
Calcium: 8.9 mg/dL (ref 8.4–10.5)
Chloride: 105 mEq/L (ref 96–112)
Creatinine, Ser: 0.87 mg/dL (ref 0.40–1.50)
Total Bilirubin: 0.6 mg/dL (ref 0.3–1.2)

## 2009-06-27 LAB — CHROMOGRANIN A

## 2009-07-11 ENCOUNTER — Ambulatory Visit: Payer: Self-pay | Admitting: Hematology & Oncology

## 2009-09-04 ENCOUNTER — Ambulatory Visit: Payer: Self-pay | Admitting: Hematology & Oncology

## 2009-09-05 ENCOUNTER — Encounter: Payer: Self-pay | Admitting: Internal Medicine

## 2009-09-05 LAB — CBC WITH DIFFERENTIAL (CANCER CENTER ONLY)
BASO#: 0 10*3/uL (ref 0.0–0.2)
Eosinophils Absolute: 0.4 10*3/uL (ref 0.0–0.5)
HGB: 13.7 g/dL (ref 13.0–17.1)
LYMPH#: 1.5 10*3/uL (ref 0.9–3.3)
NEUT#: 5.4 10*3/uL (ref 1.5–6.5)
Platelets: 188 10*3/uL (ref 145–400)
RBC: 4.84 10*6/uL (ref 4.20–5.70)
WBC: 7.6 10*3/uL (ref 4.0–10.0)

## 2009-09-10 LAB — COMPREHENSIVE METABOLIC PANEL
Albumin: 4 g/dL (ref 3.5–5.2)
BUN: 11 mg/dL (ref 6–23)
CO2: 27 mEq/L (ref 19–32)
Calcium: 9.2 mg/dL (ref 8.4–10.5)
Chloride: 105 mEq/L (ref 96–112)
Glucose, Bld: 109 mg/dL — ABNORMAL HIGH (ref 70–99)
Potassium: 3.8 mEq/L (ref 3.5–5.3)
Sodium: 141 mEq/L (ref 135–145)
Total Protein: 7 g/dL (ref 6.0–8.3)

## 2009-09-10 LAB — CHROMOGRANIN A

## 2009-09-10 LAB — LACTATE DEHYDROGENASE: LDH: 169 U/L (ref 94–250)

## 2009-09-16 ENCOUNTER — Telehealth (INDEPENDENT_AMBULATORY_CARE_PROVIDER_SITE_OTHER): Payer: Self-pay | Admitting: *Deleted

## 2009-10-10 ENCOUNTER — Ambulatory Visit: Payer: Self-pay | Admitting: Internal Medicine

## 2009-10-10 LAB — CONVERTED CEMR LAB
ALT: 28 units/L (ref 0–53)
AST: 30 units/L (ref 0–37)
Basophils Relative: 0.8 % (ref 0.0–3.0)
Bilirubin, Direct: 0.1 mg/dL (ref 0.0–0.3)
Chloride: 105 meq/L (ref 96–112)
Eosinophils Absolute: 0.4 10*3/uL (ref 0.0–0.7)
GFR calc non Af Amer: 80.3 mL/min (ref >60)
HCT: 41.8 % (ref 39.0–52.0)
Lymphs Abs: 1.7 10*3/uL (ref 0.7–4.0)
MCHC: 33.1 g/dL (ref 30.0–36.0)
MCV: 87.9 fL (ref 78.0–100.0)
Monocytes Absolute: 0.5 10*3/uL (ref 0.1–1.0)
Neutrophils Relative %: 62.6 % (ref 43.0–77.0)
Platelets: 181 10*3/uL (ref 150.0–400.0)
Potassium: 4.2 meq/L (ref 3.5–5.1)
RBC: 4.75 M/uL (ref 4.22–5.81)
Total Bilirubin: 0.9 mg/dL (ref 0.3–1.2)

## 2009-10-13 ENCOUNTER — Ambulatory Visit: Payer: Self-pay | Admitting: Internal Medicine

## 2009-10-13 DIAGNOSIS — L910 Hypertrophic scar: Secondary | ICD-10-CM | POA: Insufficient documentation

## 2009-10-13 DIAGNOSIS — Z87891 Personal history of nicotine dependence: Secondary | ICD-10-CM | POA: Insufficient documentation

## 2009-10-15 ENCOUNTER — Ambulatory Visit (HOSPITAL_BASED_OUTPATIENT_CLINIC_OR_DEPARTMENT_OTHER): Admission: RE | Admit: 2009-10-15 | Discharge: 2009-10-15 | Payer: Self-pay | Admitting: Hematology & Oncology

## 2009-10-15 ENCOUNTER — Ambulatory Visit: Payer: Self-pay | Admitting: Diagnostic Radiology

## 2009-10-15 IMAGING — CT CT CHEST W/ CM
4 of 5 series · 10 of 46 positions shown, 15 images · IV contrast (APPLIED)
Comparison: [DATE].

CT CHEST

CLINICAL DATA: Metastatic carcinoid tumor.

CT CHEST, ABDOMEN AND PELVIS WITH CONTRAST
TECHNIQUE: Multidetector CT imaging of the chest, abdomen and
pelvis was performed following the standard protocol during bolus
administration of intravenous contrast.
Contrast: 100 ml [26]

[Series 2: chest/abd/pel 5.0 b31f · axial · 0.79mm/px · z∈[+738,+934]mm · 4 of 126 slices shown]
[im 14/126  soft-tissue]
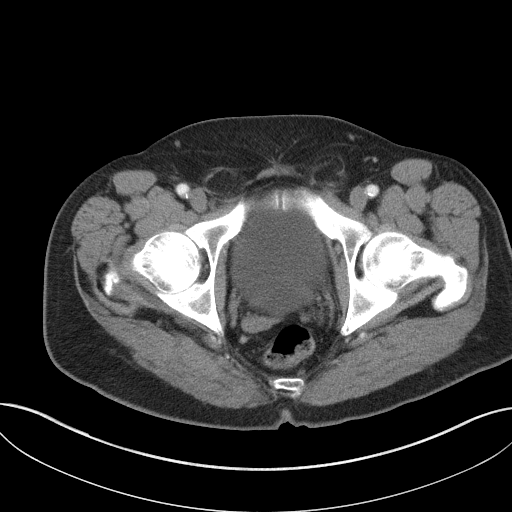
[im 27/126  soft-tissue]
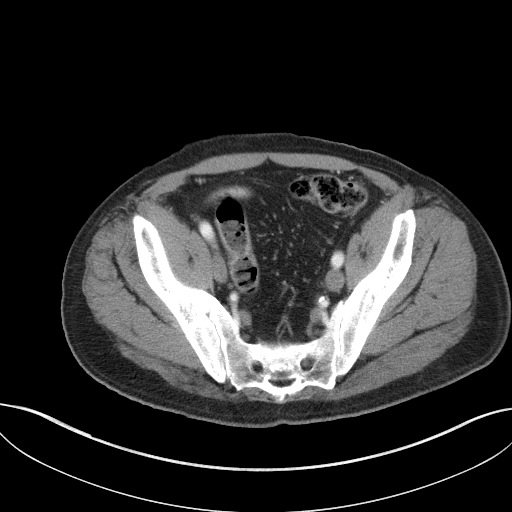
[im 40/126  soft-tissue]
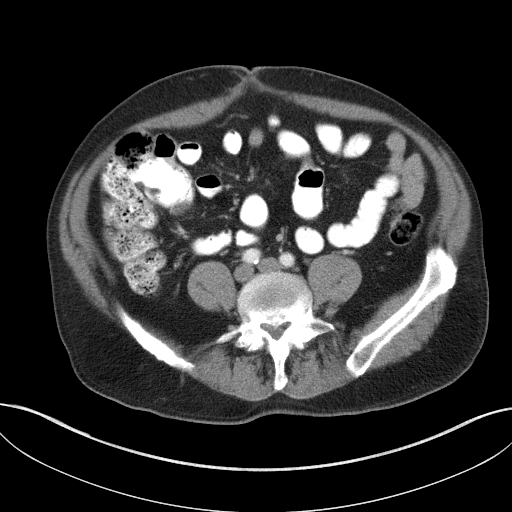
[im 53/126  soft-tissue]
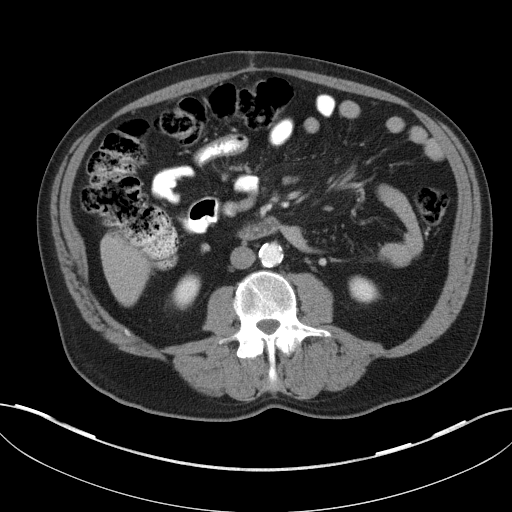

[Series 5: chest/abd/pel 3.0 coronal · coronal · 0.87mm/px · 3 of 99 slices shown, 4 images]
[im 33/99  soft-tissue]
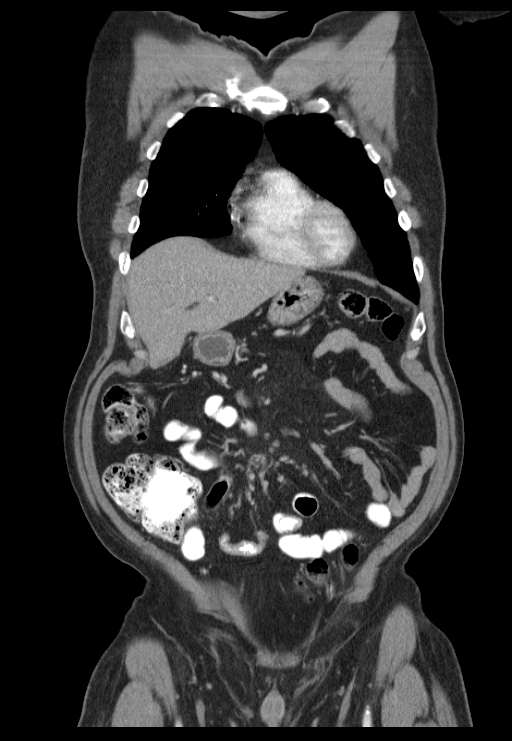
[im 44/99  soft-tissue]
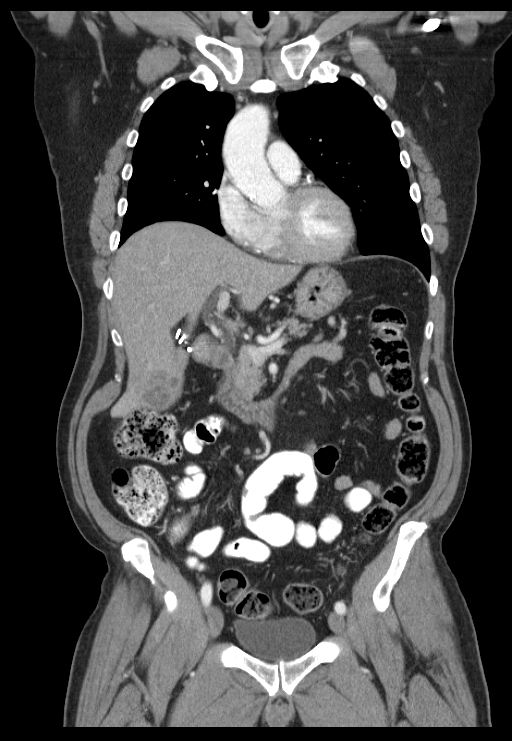
[im 44/99  bone]
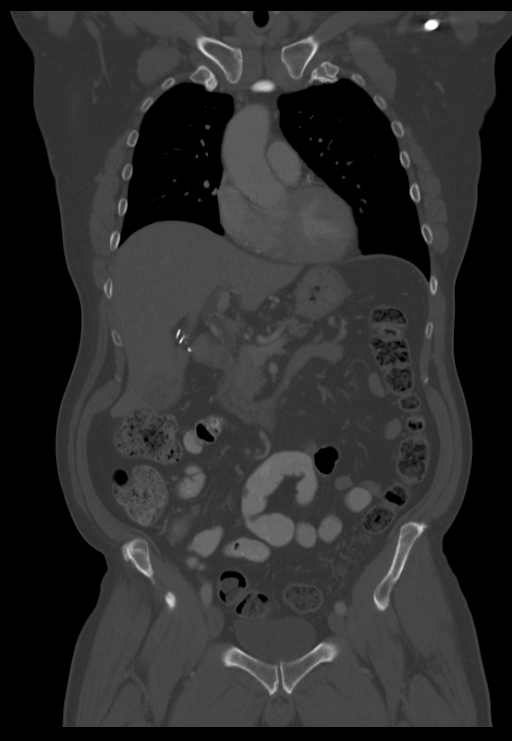
[im 55/99  soft-tissue]
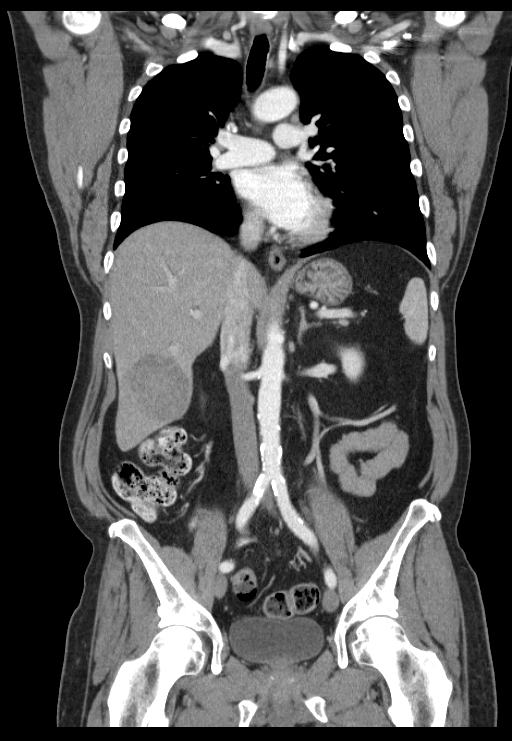

[Series 6: chest/abd/pel 3.0 sagittal · sagittal · 0.64mm/px · 1 of 122 slices shown, 2 images]
[im 41/122  soft-tissue]
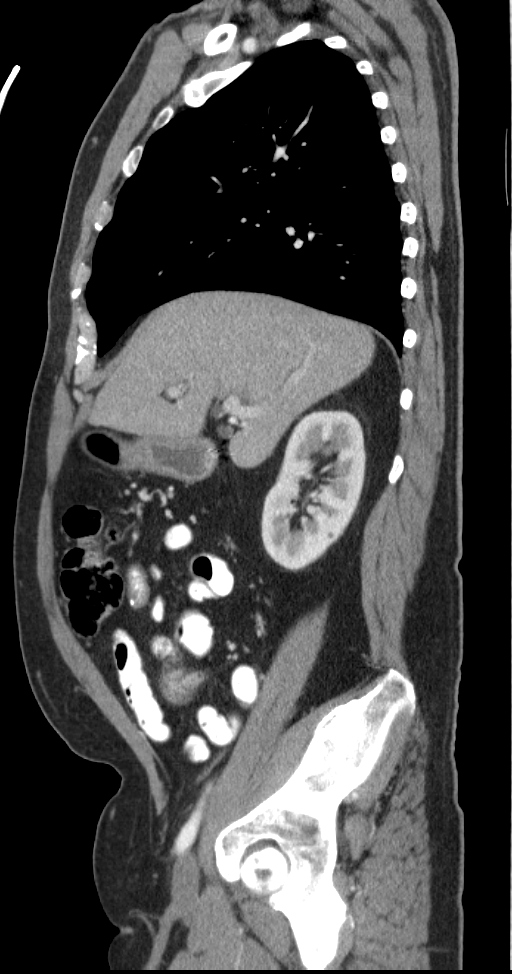
[im 41/122  bone]
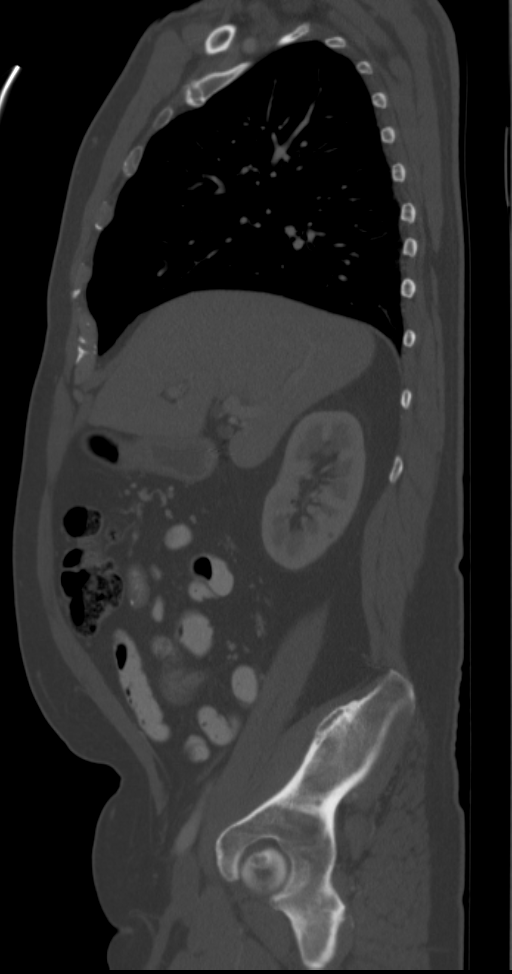

[Series 7: renal delay 5.0 b30f · axial · delayed · 0.79mm/px · z∈[+962,+1006]mm · 2 of 29 slices shown, 5 images]
[im 10/29  soft-tissue]
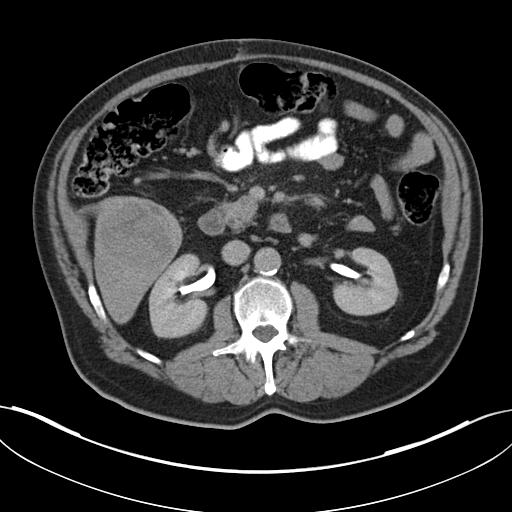
[im 10/29  lung]
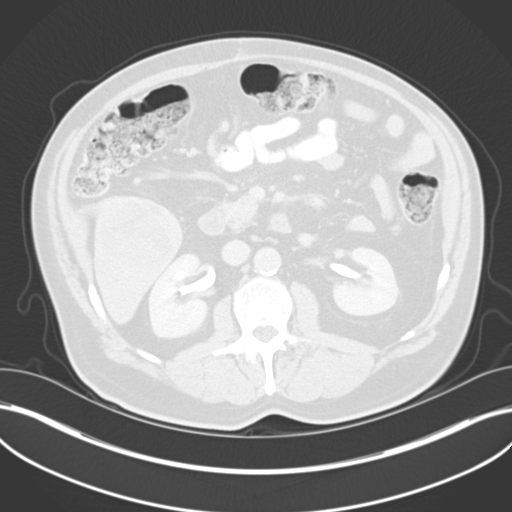
[im 10/29  bone]
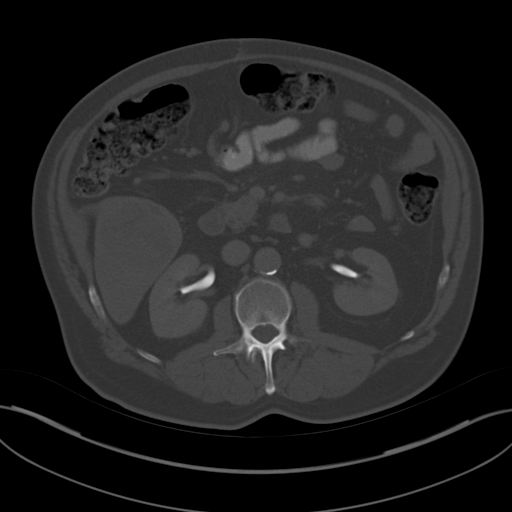
[im 19/29  soft-tissue]
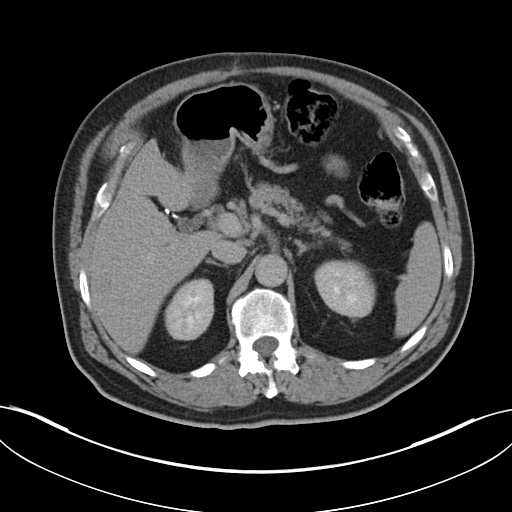
[im 19/29  lung]
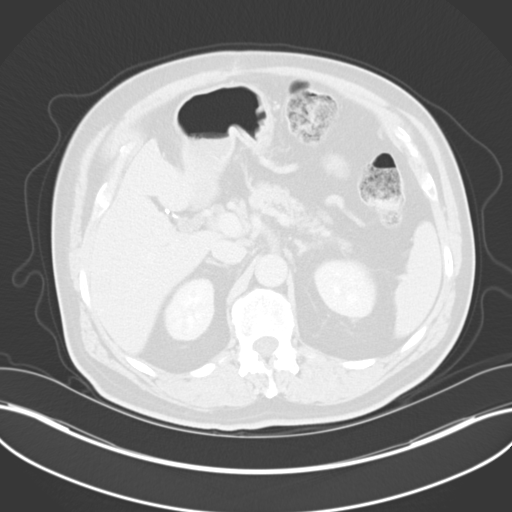

[10 of 46 positions shown; findings below may reference images not displayed]

FINDINGS: No axillary lymphadenopathy.  11 mm short-axis AP window
lymph node is assist with other scattered tiny mediastinal nodes.
No hilar lymphadenopathy.  Coronary artery calcification is
evident.  Heart size is normal.  No pericardial or pleural
effusion.

No focal airspace consolidation.  No pulmonary edema.  Calcified
nodule noted in the left upper lobe, unchanged in size.

Bone windows reveal no worrisome lytic or sclerotic osseous
lesions.
IMPRESSION: No evidence for metastatic disease in the chest.

CT ABDOMEN
FINDINGS: Dominant metastatic lesion in the liver is seen in the
inferior right hepatic tip.  This measures 5.9 x 5.2 cm today
compared to 5.1 x 4.1 cm previously.  A smaller adjacent lesion
measures 1.9 x 2.7 cm today compared 2.2 x 2.4 cm previously.  A
third lesion seen just cranial to these two measures 2.2 cm today
compared to 1.6 cm previously.

Calcified mass within the central mesentery measures 2.9 x 2.6 cm
today compared 2.9 x 2.6 cm previously.

Spleen is unremarkable.  The stomach, duodenum, pancreas, adrenal
glands, and kidneys have normal imaging features.  There is no free
intraperitoneal fluid.  No abdominal aortic aneurysm.
IMPRESSION: Dominant inferior right liver lesion has increased in size in the
interval. Two other prominent liver lesions are relatively stable
in the interval.

No change in the calcified mass of the central mesentery.

Stable appearance of a 6 mm left upper lobe pulmonary nodule.

CT PELVIS
FINDINGS: Bladder is unremarkable.  Bilateral inguinal hernias
contain only fat.  Prostate gland is enlarged.  No pelvic
lymphadenopathy.

The colon is unremarkable.  Terminal ileum has normal imaging
features.  The appendix is not visualized, but there is no
inflammation in the right lower quadrant.

Bone windows reveal no worrisome lytic or sclerotic osseous
lesions.
IMPRESSION: Stable.  No evidence for metastatic disease in the pelvis.

## 2009-10-28 ENCOUNTER — Ambulatory Visit: Payer: Self-pay | Admitting: Hematology & Oncology

## 2009-10-29 ENCOUNTER — Encounter: Payer: Self-pay | Admitting: Internal Medicine

## 2009-10-29 LAB — CBC WITH DIFFERENTIAL (CANCER CENTER ONLY)
BASO#: 0 10*3/uL (ref 0.0–0.2)
Eosinophils Absolute: 0.4 10*3/uL (ref 0.0–0.5)
HGB: 13.8 g/dL (ref 13.0–17.1)
LYMPH%: 26.7 % (ref 14.0–48.0)
MCH: 28.6 pg (ref 28.0–33.4)
MCV: 85 fL (ref 82–98)
MONO#: 0.3 10*3/uL (ref 0.1–0.9)
MONO%: 4.2 % (ref 0.0–13.0)
NEUT#: 4.7 10*3/uL (ref 1.5–6.5)
Platelets: 196 10*3/uL (ref 145–400)
RBC: 4.81 10*6/uL (ref 4.20–5.70)
WBC: 7.4 10*3/uL (ref 4.0–10.0)

## 2009-10-30 LAB — COMPREHENSIVE METABOLIC PANEL
ALT: 21 U/L (ref 0–53)
Alkaline Phosphatase: 52 U/L (ref 39–117)
Creatinine, Ser: 0.97 mg/dL (ref 0.40–1.50)
Sodium: 142 mEq/L (ref 135–145)
Total Bilirubin: 0.5 mg/dL (ref 0.3–1.2)
Total Protein: 7.2 g/dL (ref 6.0–8.3)

## 2009-11-27 ENCOUNTER — Ambulatory Visit: Payer: Self-pay | Admitting: Hematology & Oncology

## 2009-11-28 ENCOUNTER — Encounter: Payer: Self-pay | Admitting: Internal Medicine

## 2009-11-28 LAB — CBC WITH DIFFERENTIAL (CANCER CENTER ONLY)
BASO%: 0.4 % (ref 0.0–2.0)
EOS%: 5.6 % (ref 0.0–7.0)
HCT: 40.6 % (ref 38.7–49.9)
LYMPH%: 28 % (ref 14.0–48.0)
MCHC: 33.7 g/dL (ref 32.0–35.9)
MCV: 85 fL (ref 82–98)
MONO%: 4.1 % (ref 0.0–13.0)
NEUT%: 61.9 % (ref 40.0–80.0)
Platelets: 184 10*3/uL (ref 145–400)
RDW: 12.2 % (ref 10.5–14.6)
WBC: 7.2 10*3/uL (ref 4.0–10.0)

## 2009-11-28 LAB — COMPREHENSIVE METABOLIC PANEL
ALT: 25 U/L (ref 0–53)
AST: 23 U/L (ref 0–37)
Alkaline Phosphatase: 68 U/L (ref 39–117)
CO2: 25 mEq/L (ref 19–32)
Creatinine, Ser: 0.85 mg/dL (ref 0.40–1.50)
Sodium: 140 mEq/L (ref 135–145)
Total Bilirubin: 0.6 mg/dL (ref 0.3–1.2)
Total Protein: 7.4 g/dL (ref 6.0–8.3)

## 2010-01-21 ENCOUNTER — Ambulatory Visit: Payer: Self-pay | Admitting: Hematology & Oncology

## 2010-01-23 ENCOUNTER — Ambulatory Visit: Payer: Self-pay | Admitting: Diagnostic Radiology

## 2010-01-23 ENCOUNTER — Encounter: Payer: Self-pay | Admitting: Internal Medicine

## 2010-01-23 ENCOUNTER — Ambulatory Visit (HOSPITAL_BASED_OUTPATIENT_CLINIC_OR_DEPARTMENT_OTHER): Admission: RE | Admit: 2010-01-23 | Discharge: 2010-01-23 | Payer: Self-pay | Admitting: Hematology & Oncology

## 2010-01-23 LAB — CMP (CANCER CENTER ONLY)
Albumin: 3.6 g/dL (ref 3.3–5.5)
CO2: 31 mEq/L (ref 18–33)
Calcium: 9.8 mg/dL (ref 8.0–10.3)
Glucose, Bld: 101 mg/dL (ref 73–118)
Potassium: 4.1 mEq/L (ref 3.3–4.7)
Sodium: 141 mEq/L (ref 128–145)
Total Protein: 8.3 g/dL — ABNORMAL HIGH (ref 6.4–8.1)

## 2010-01-23 LAB — CBC WITH DIFFERENTIAL (CANCER CENTER ONLY)
HCT: 44.1 % (ref 38.7–49.9)
LYMPH#: 2.1 10*3/uL (ref 0.9–3.3)
MCH: 28.2 pg (ref 28.0–33.4)
MCHC: 32.7 g/dL (ref 32.0–35.9)
NEUT%: 60.7 % (ref 40.0–80.0)
RDW: 12.1 % (ref 10.5–14.6)

## 2010-01-23 IMAGING — CT CT CHEST W/ CM
3 of 5 series · 7 of 46 positions shown, 13 images · IV contrast (APPLIED)
Comparison: [DATE].

CT CHEST

CLINICAL DATA: Evaluate for recurrence of metastatic carcinoma.
Small intestine resection.  Prior cholecystectomy appendectomy.  No
complaints.

CT CHEST, ABDOMEN AND PELVIS WITH CONTRAST
TECHNIQUE: Contiguous axial images of the chest abdomen and pelvis
were obtained after IV contrast administration.
Contrast: 100  ml [ZX]

[Series 4: chest/abd/pel 3.0 coronal · coronal · 0.75mm/px · 3 of 81 slices shown, 4 images]
[im 27/81  soft-tissue]
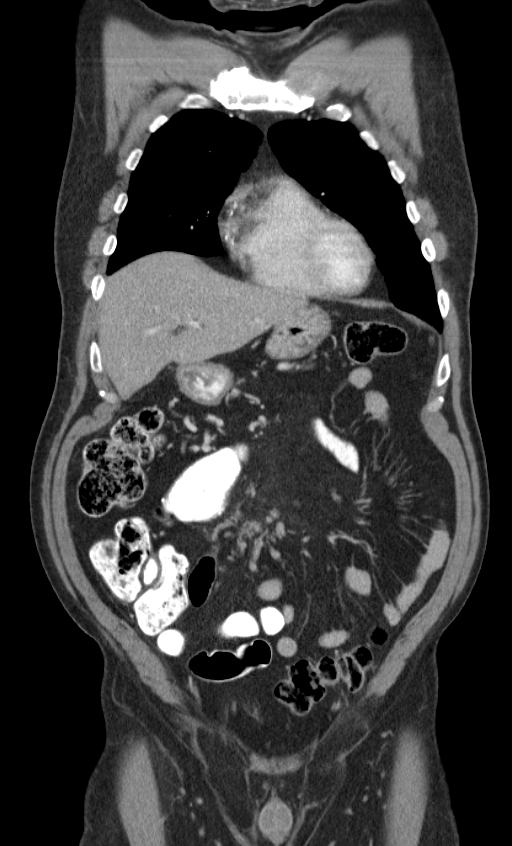
[im 36/81  soft-tissue]
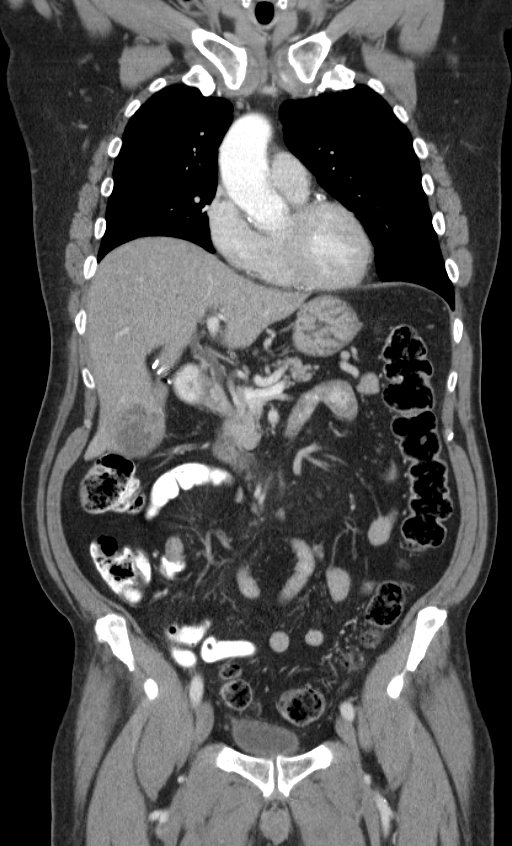
[im 36/81  bone]
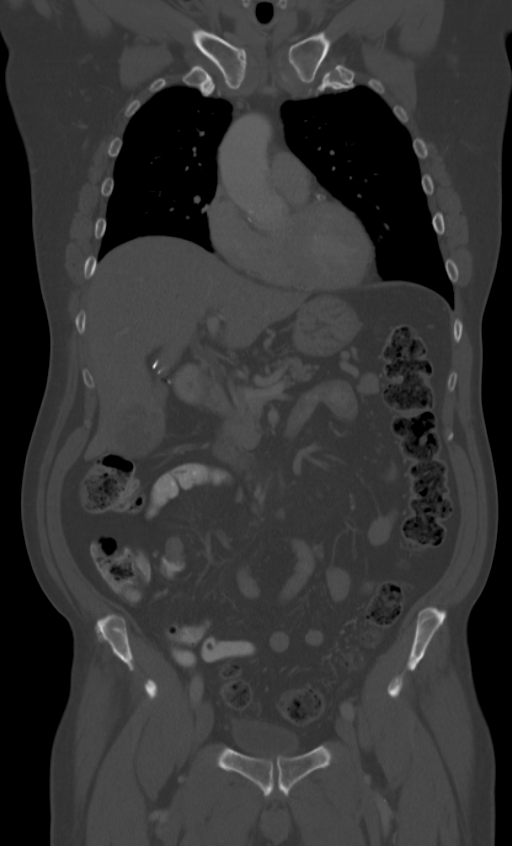
[im 45/81  soft-tissue]
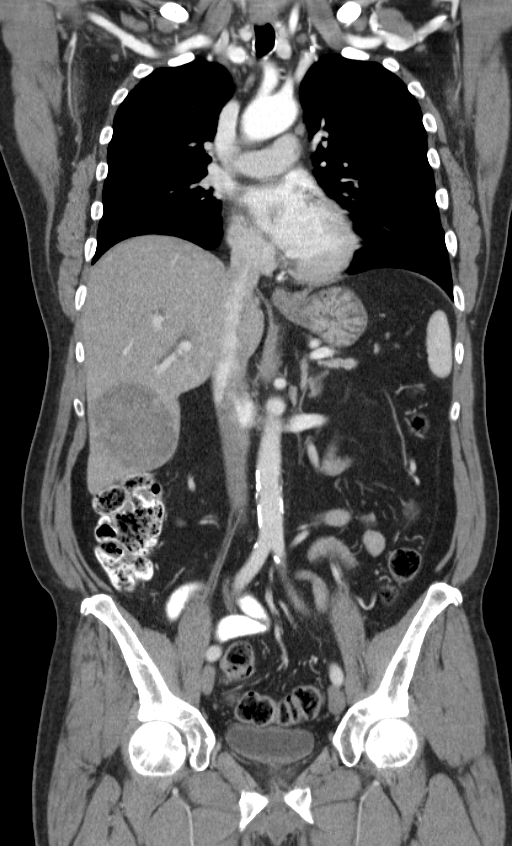

[Series 5: chest/abd/pel 3.0 sagittal · sagittal · 0.61mm/px · 1 of 101 slices shown, 2 images]
[im 34/101  soft-tissue]
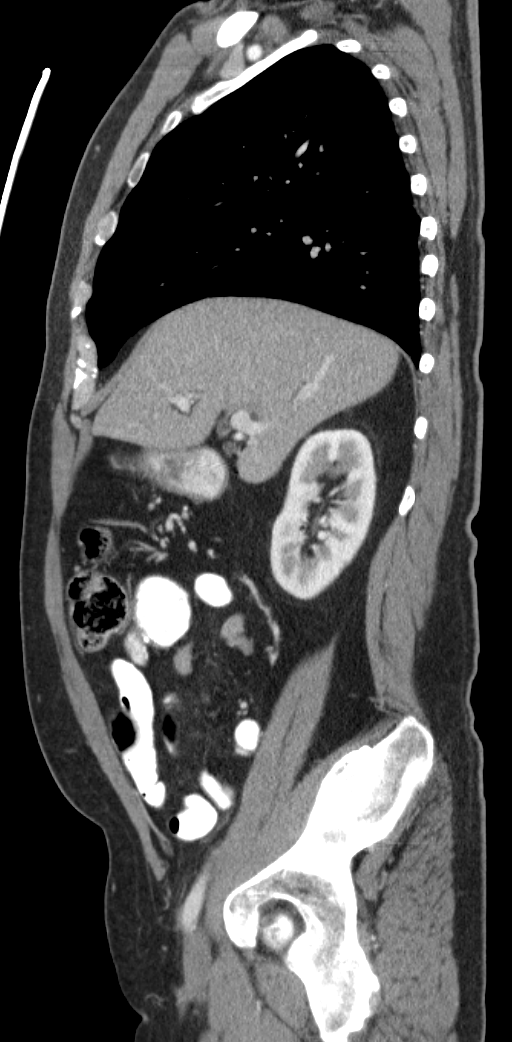
[im 34/101  bone]
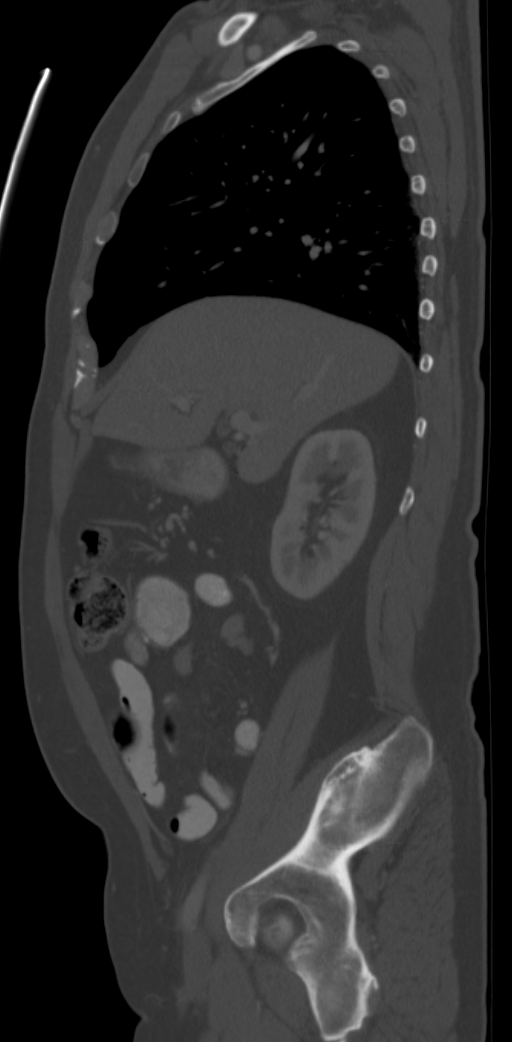

[Series 7: renal delay 5.0 b30f · axial · delayed · 0.60mm/px · z∈[-406,-326]mm · 3 of 34 slices shown, 7 images]
[im 9/34  soft-tissue]
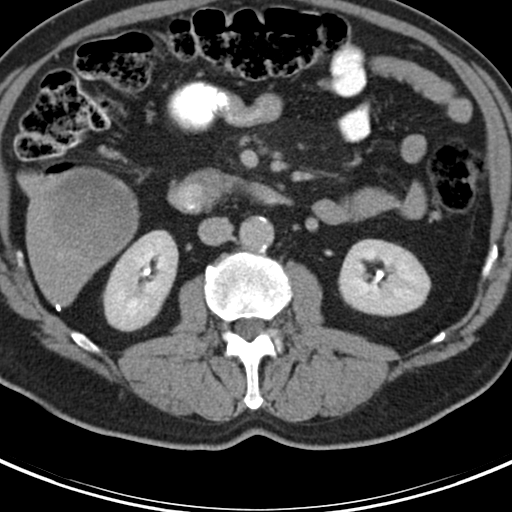
[im 9/34  lung]
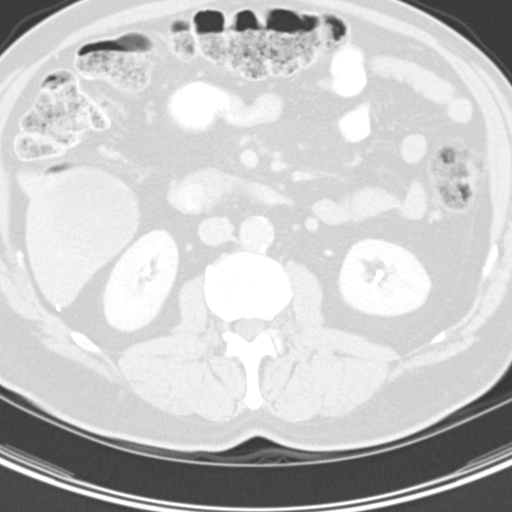
[im 9/34  bone]
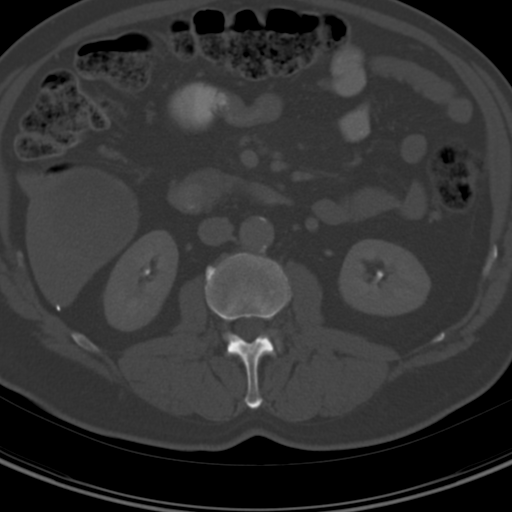
[im 17/34  soft-tissue]
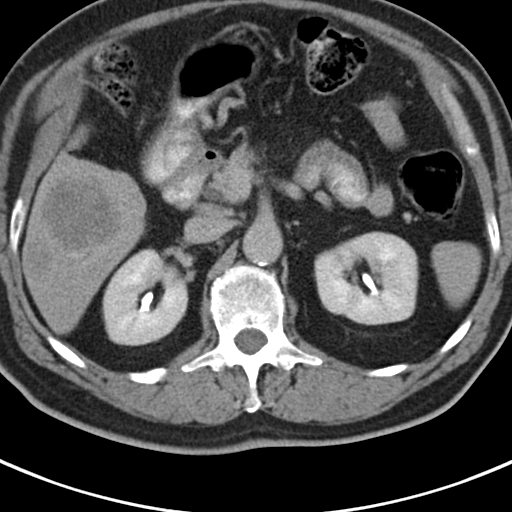
[im 17/34  lung]
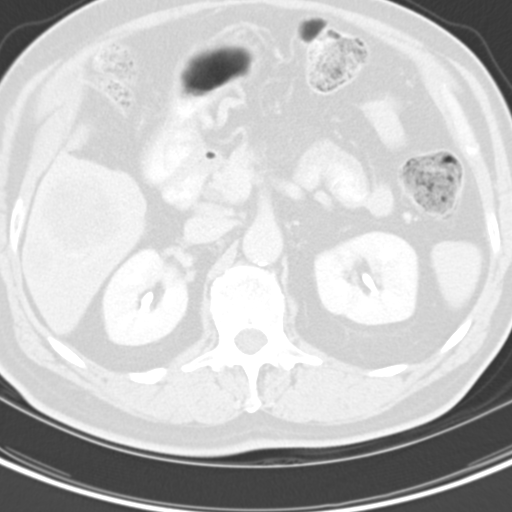
[im 25/34  soft-tissue]
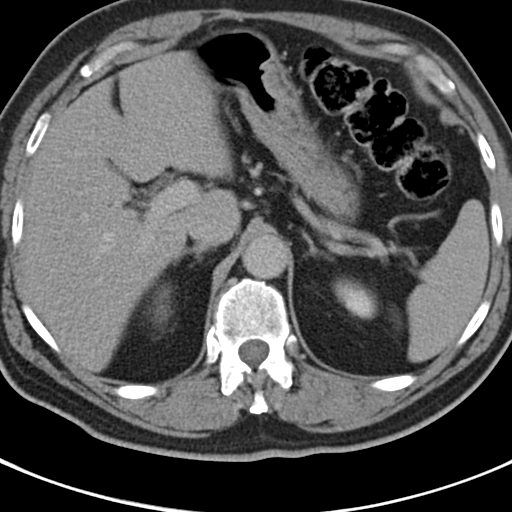
[im 25/34  lung]
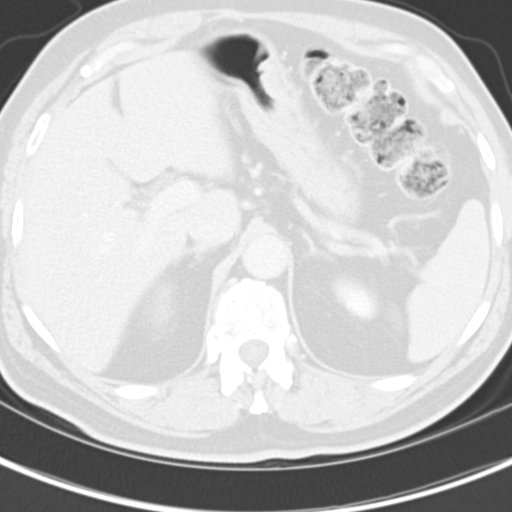

[7 of 46 positions shown; findings below may reference images not displayed]

FINDINGS: Lung windows demonstrate a partially calcified left upper
lobe lung nodule on image 32 measures 5 mm is unchanged.  No new or
enlarging lung nodules.

Soft tissue windows demonstrate stable small left jugular lymph
node on image 8.

Normal heart size without pericardial or pleural effusion.
Multivessel coronary artery atherosclerosis.  This is mildly age
advanced.  An AP window lymph node measures 8 mm on image 21 versus
11 mm on the prior exam. Normal heart size without pericardial or
pleural effusion.   No mediastinal or hilar adenopathy.
IMPRESSION: 1. No acute process or evidence of metastatic disease in the chest.
2.  Partially calcified left upper lobe lung nodule is stable and
likely related to old granulomatous disease.

CT ABDOMEN AND PELVIS
FINDINGS: The subcapsular right hepatic lobe lesion measures
cm on image 56 and is unchanged.  2 adjacent inferior right hepatic
lobe lesions are again identified.  The more lateral measures 2.9 x
2.0 cm on image 64 and is felt to be similar 1.9 x 2.7 cm on the
prior exam.

The larger more medial lesion measures 6.5 x 8 5.7 cm on image 67
of series 2 versus 5.9 x 5.2 cm on the prior exam.  In cranial
caudal dimension, this measures 7.0 cm versus 6.5 cm on the prior.
Findings suggest overall slight enlargement.

A low density lesion central liver on image 47 is unchanged and
likely a cyst.  Normal spleen, stomach, pancreas. Cholecystectomy
without biliary ductal dilatation.  Normal adrenal glands and
kidneys.

Normal colon and terminal ileum.  No small bowel obstruction.
Small bowel mesenteric root nodule/node is 1.0 cm, unchanged on
image 66.  A partially calcified mass within the small bowel
mesentery measures 2.7 x 2.5 cm on image 82 of series 2 versus
x 2.6 cm on the prior exam.  This suggests stability .  Similar
configuration of smaller surrounding mesenteric nodes.

No ascites.

Bilateral fat containing inguinal hernias.  No pelvic sidewall
adenopathy. Normal urinary bladder and prostate.  No significant
free fluid.  Nodules within the subcutaneous fat superficial to the
gluteal musculature likely due to injection sites.  Degenerative
disc disease at the lumbosacral junction.
IMPRESSION: 1.  Slight enlargement of the dominant right liver lobe lesion.
Other metastasis appear similar.
2.  Similar calcified mesenteric mass consistent with metastatic
carcinoid.

## 2010-01-28 LAB — CHROMOGRANIN A: Chromogranin A: 5.6 ng/mL (ref 1.9–15.0)

## 2010-02-04 ENCOUNTER — Encounter: Payer: Self-pay | Admitting: Internal Medicine

## 2010-02-04 ENCOUNTER — Encounter: Admission: RE | Admit: 2010-02-04 | Discharge: 2010-02-04 | Payer: Self-pay | Admitting: Hematology & Oncology

## 2010-02-20 ENCOUNTER — Ambulatory Visit: Payer: Self-pay | Admitting: Hematology & Oncology

## 2010-03-13 ENCOUNTER — Encounter: Payer: Self-pay | Admitting: Internal Medicine

## 2010-03-13 LAB — CBC WITH DIFFERENTIAL (CANCER CENTER ONLY)
Eosinophils Absolute: 0.3 10*3/uL (ref 0.0–0.5)
MONO#: 0.4 10*3/uL (ref 0.1–0.9)
MONO%: 5.9 % (ref 0.0–13.0)
NEUT#: 3.8 10*3/uL (ref 1.5–6.5)
Platelets: 184 10*3/uL (ref 145–400)
RBC: 4.64 10*6/uL (ref 4.20–5.70)
WBC: 6.2 10*3/uL (ref 4.0–10.0)

## 2010-03-17 LAB — COMPREHENSIVE METABOLIC PANEL
ALT: 16 U/L (ref 0–53)
Albumin: 4 g/dL (ref 3.5–5.2)
Alkaline Phosphatase: 54 U/L (ref 39–117)
CO2: 27 mEq/L (ref 19–32)
Glucose, Bld: 93 mg/dL (ref 70–99)
Potassium: 4 mEq/L (ref 3.5–5.3)
Sodium: 141 mEq/L (ref 135–145)
Total Protein: 7.2 g/dL (ref 6.0–8.3)

## 2010-03-17 LAB — CHROMOGRANIN A: Chromogranin A: 7.2 ng/mL (ref 1.9–15.0)

## 2010-03-19 ENCOUNTER — Encounter (INDEPENDENT_AMBULATORY_CARE_PROVIDER_SITE_OTHER): Payer: Self-pay | Admitting: *Deleted

## 2010-04-14 ENCOUNTER — Telehealth: Payer: Self-pay | Admitting: Internal Medicine

## 2010-04-16 ENCOUNTER — Ambulatory Visit: Payer: Self-pay | Admitting: Hematology & Oncology

## 2010-04-17 ENCOUNTER — Encounter: Payer: Self-pay | Admitting: Internal Medicine

## 2010-04-17 LAB — CBC WITH DIFFERENTIAL (CANCER CENTER ONLY)
BASO%: 0.4 % (ref 0.0–2.0)
Eosinophils Absolute: 0.3 10*3/uL (ref 0.0–0.5)
HCT: 42.2 % (ref 38.7–49.9)
LYMPH%: 28.2 % (ref 14.0–48.0)
MCV: 86 fL (ref 82–98)
MONO#: 0.3 10*3/uL (ref 0.1–0.9)
NEUT%: 62.7 % (ref 40.0–80.0)
RDW: 12.1 % (ref 10.5–14.6)
WBC: 6 10*3/uL (ref 4.0–10.0)

## 2010-04-23 LAB — COMPREHENSIVE METABOLIC PANEL
CO2: 26 mEq/L (ref 19–32)
Creatinine, Ser: 0.9 mg/dL (ref 0.40–1.50)
Glucose, Bld: 97 mg/dL (ref 70–99)
Total Bilirubin: 0.7 mg/dL (ref 0.3–1.2)

## 2010-04-23 LAB — LACTATE DEHYDROGENASE: LDH: 149 U/L (ref 94–250)

## 2010-04-23 LAB — CHROMOGRANIN A

## 2010-05-05 ENCOUNTER — Encounter: Admission: RE | Admit: 2010-05-05 | Discharge: 2010-05-05 | Payer: Self-pay | Admitting: Hematology & Oncology

## 2010-05-05 IMAGING — CT CT ANGIO ABDOMEN
2 of 7 series · 13 of 36 positions shown, 18 images · IV contrast (READICAT/WATER & [ID] OMNI 350)
Comparison: [DATE]

CLINICAL DATA: Metastatic carcinoid malignancy

CT ANGIOGRAPHY ABDOMEN
TECHNIQUE: Multidetector CT imaging of the abdomen was performed
using the standard protocol during bolus administration of
intravenous contrast.  Multiplanar reconstructed images including
MIPs were obtained and reviewed to evaluate the vascular anatomy.
Contrast: 100 ml Omnipaque 350

[Series 5: arterial/portal venous · axial · arterial · 0.78mm/px · z∈[-172,+20]mm · 7 of 155 slices shown, 12 images]
[im 20/155  soft-tissue]
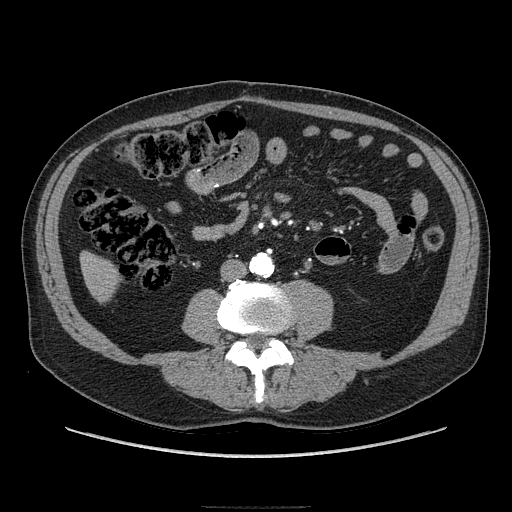
[im 20/155  bone]
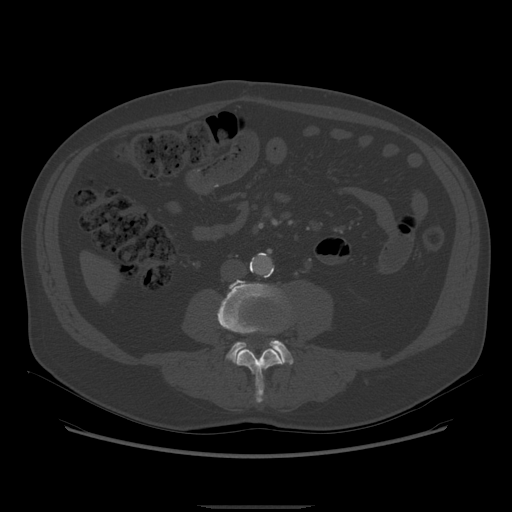
[im 39/155  soft-tissue]
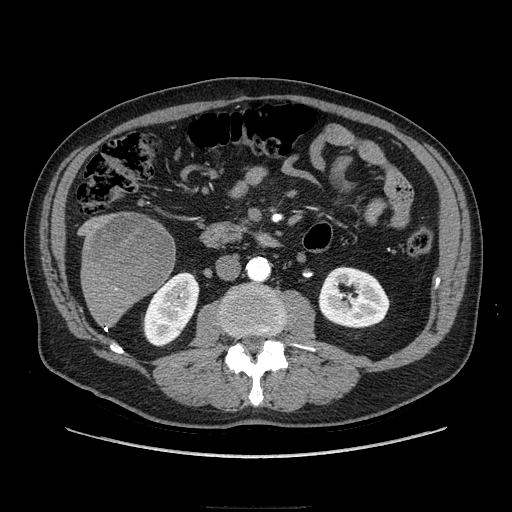
[im 58/155  soft-tissue]
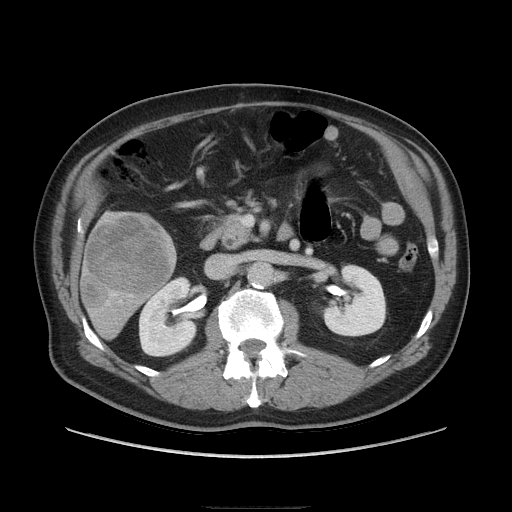
[im 78/155  soft-tissue]
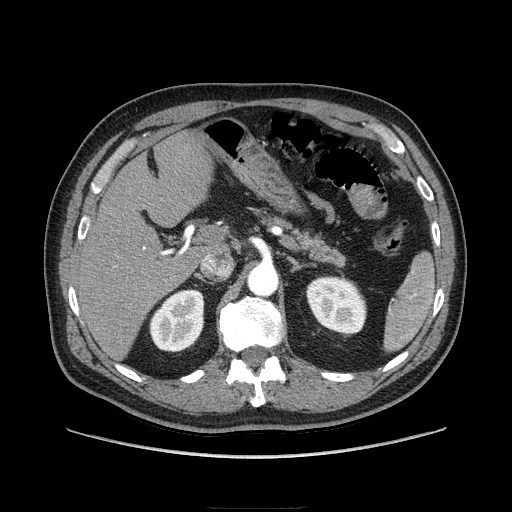
[im 78/155  lung]
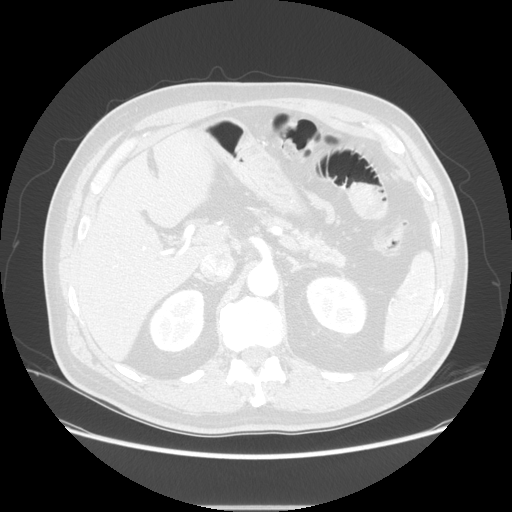
[im 97/155  soft-tissue]
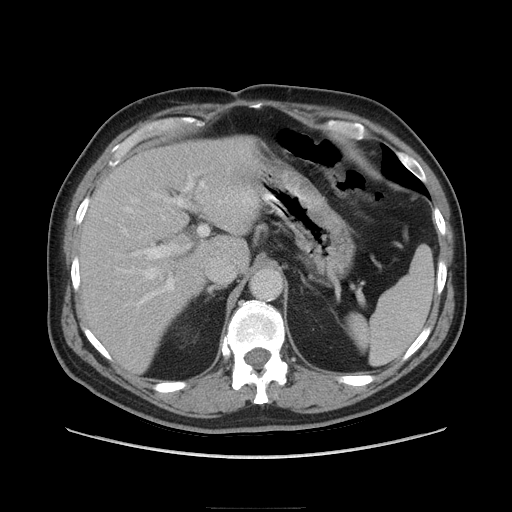
[im 97/155  lung]
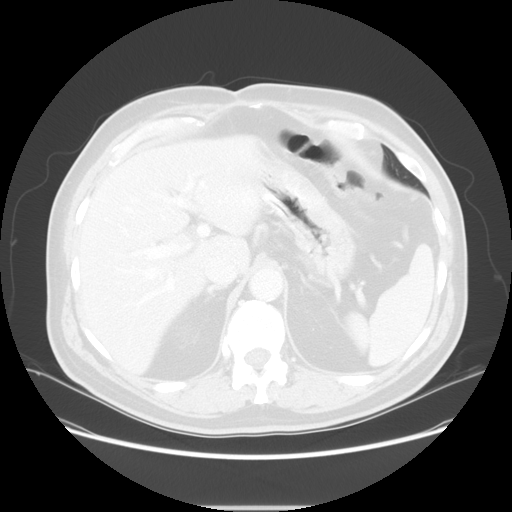
[im 116/155  soft-tissue]
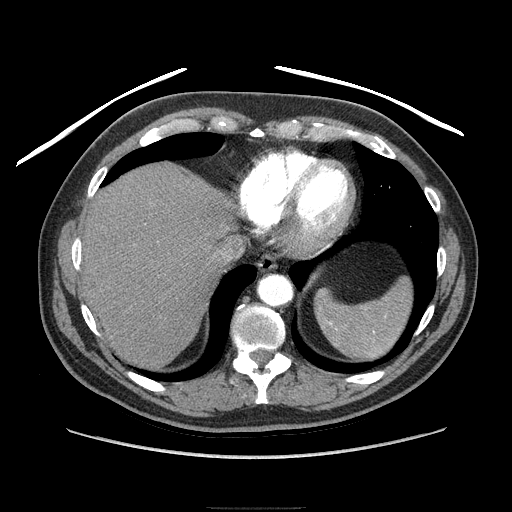
[im 116/155  lung]
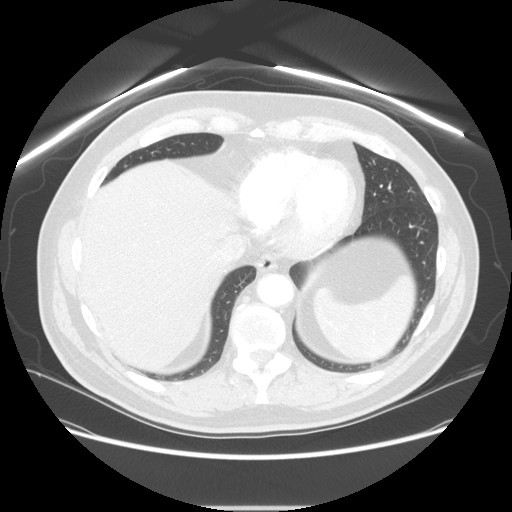
[im 135/155  soft-tissue]
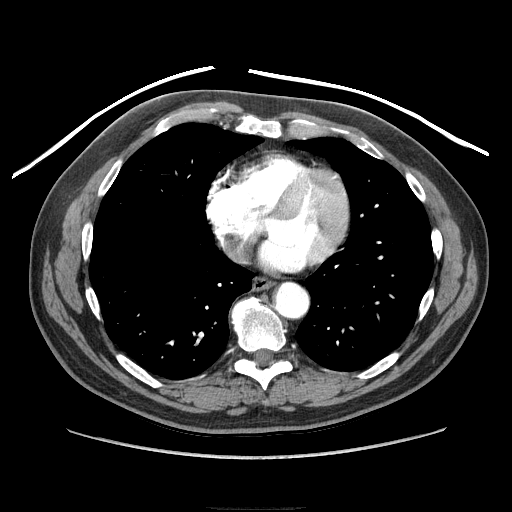
[im 135/155  lung]
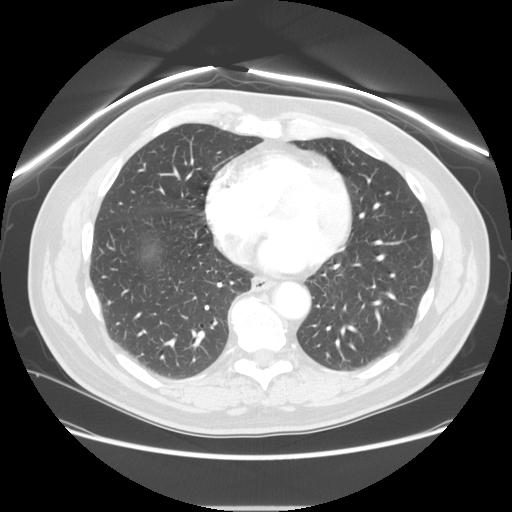

[Series 605: sagittal body · sagittal · 0.78mm/px · 6 of 158 slices shown]
[im 18/158  soft-tissue]
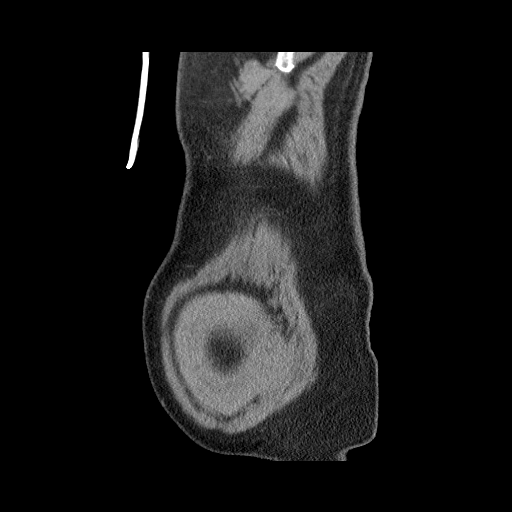
[im 35/158  soft-tissue]
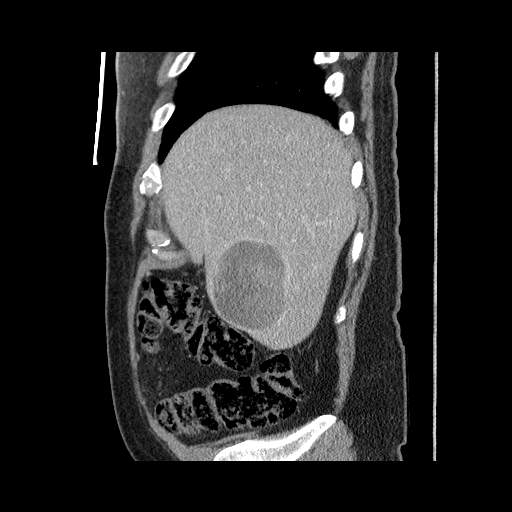
[im 53/158  soft-tissue]
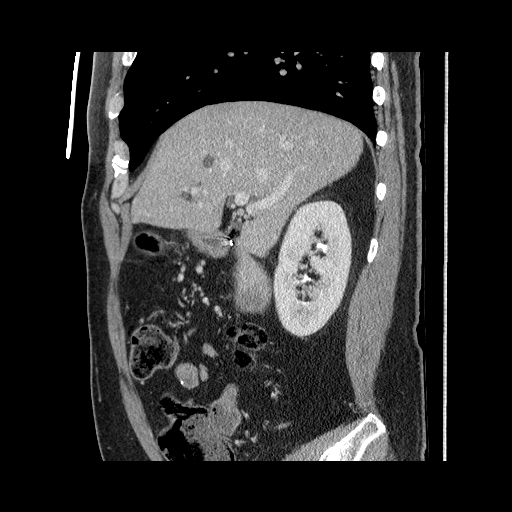
[im 70/158  soft-tissue]
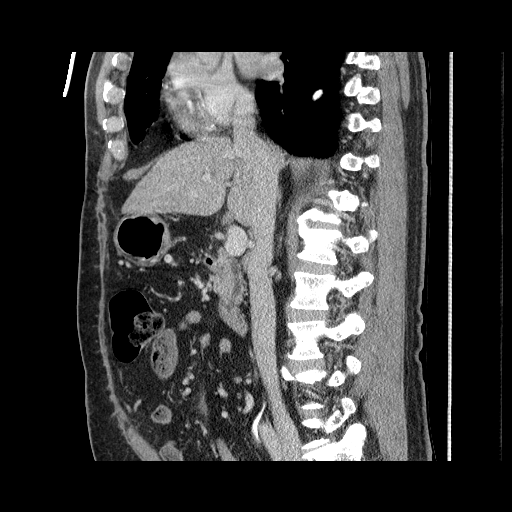
[im 88/158  soft-tissue]
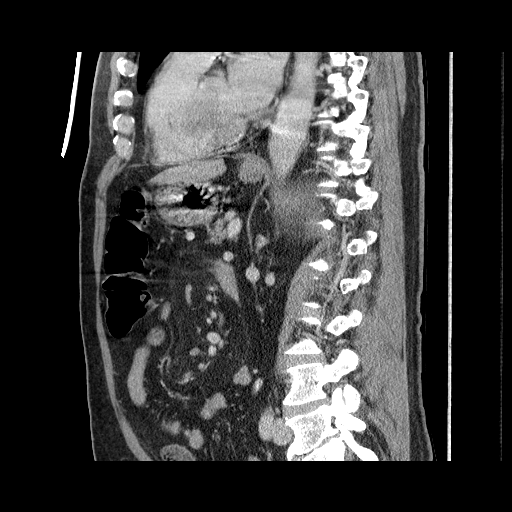
[im 105/158  soft-tissue]
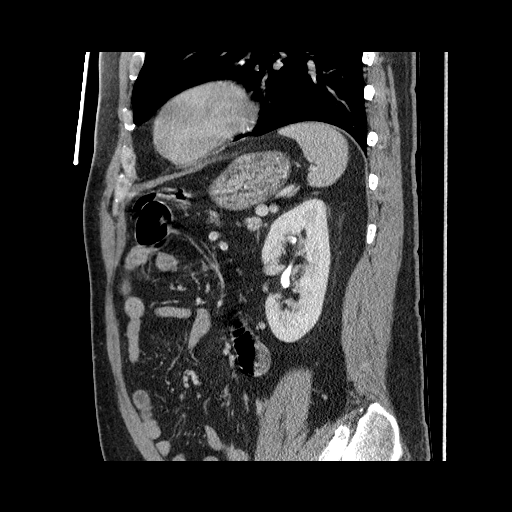

[13 of 36 positions shown; findings below may reference images not displayed]

FINDINGS: The celiac axis, SMA, and IMA are patent.  Single renal
arteries are patent.  Aorta is nonaneurysmal and patent with mild
atherosclerotic change.

The splenic artery, proper hepatic artery, and gastroduodenal
artery takeoff in the trifurcation configuration.  The proper
hepatic artery continues to become the right hepatic artery.  And
nearly completely replaced left hepatic artery occurs from the left
gastric artery.  At least two small branches from this vessel
extend to the lesser curvature of the stomach.  A small accessory
left hepatic artery arises from the gastroduodenal artery.  The
right gastric artery is not readily apparent.

Portal vein and splenic vein and superior mesenteric vein are all
patent.

2.6 x 2.1 cm calcified mesenteric mass is not significantly
changed.

The dominant lesion in the right lobe of the liver is slightly
larger and measures 6.4 x 7.1 cm on image 37 and previously
measured 6.5 x 5.7 cm.  Adjacent more lateral lesion is also larger
measures 2.2 x 3.2 cm.  Subcapsular lesion on image 26 in the right
lobe is slightly larger measuring 2.7 cm in greatest dimension.
Stable liver cysts.    Subtle small new lesion is present in the
lateral segment of the left lobe of liver on image 19.

Kidneys, adrenal glands, spleen, pancreas, adrenal glands are
within normal limits.  Post cholecystectomy.  No ascites.  No
abnormal adenopathy.  No pleural effusion.

Degenerative changes in the spine without destructive bone lesion.

 Review of the MIP images confirms the above findings.
IMPRESSION: The variants hepatic anatomy is noted and described above.

Enlarging liver lesions.

Stable calcified mesenteric mass.

## 2010-05-13 ENCOUNTER — Ambulatory Visit (HOSPITAL_COMMUNITY): Admission: RE | Admit: 2010-05-13 | Discharge: 2010-05-13 | Payer: Self-pay | Admitting: Interventional Radiology

## 2010-05-13 ENCOUNTER — Telehealth: Payer: Self-pay | Admitting: Internal Medicine

## 2010-05-18 ENCOUNTER — Ambulatory Visit (HOSPITAL_COMMUNITY): Admission: RE | Admit: 2010-05-18 | Discharge: 2010-05-18 | Payer: Self-pay | Admitting: Interventional Radiology

## 2010-05-18 IMAGING — NM NM LIVER IMG SPECT
1 series · 1 of 1 positions shown · non-contrast
Comparison: CTA [DATE]

[DATE] –DUPLICATE COPY for exam association in RIS. No change from original report.
CLINICAL DATA: Carcinoid metastasis with unresectable liver
 metastases.

 NUCLEAR MEDICINE LIVER SCAN with SPECT
TECHNIQUE: Abdominal images were obtained in multiple projections
 after intrahepatic arterial injection of radiopharmaceutical. SPECT
 imaging was performed. Lung shunt calculation was performed using
 geometric mean.
 Radiopharmaceutical: 5.0 mCi [F1] MAA.

[antr ca · 1 of 1 slices shown]
[im 1/1]
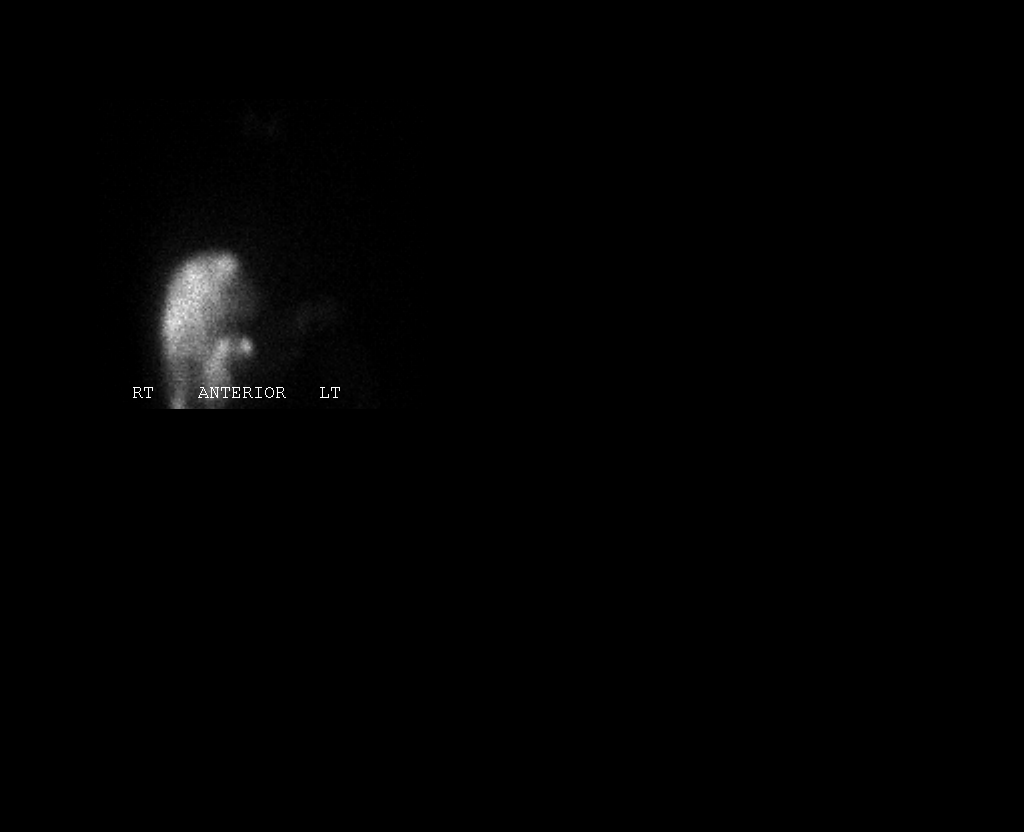

[1 of 1 positions shown; findings below may reference images not displayed]

FINDINGS: The interarterial injected microaggregated albumin
 localizes within the liver.

 No evidence of activity within the stomach, duodenum, or bowel.

 Calculated shunt fraction to the lungs equals 2.0%
IMPRESSION: 1. No significant extrahepatic radiotracer activity following
 intrahepatic arterial injection of MAA.
 2. Lung shunt fraction equals 2.0%

## 2010-05-18 IMAGING — US IR US GUIDE VASC ACCESS RIGHT
1 series · 1 of 1 positions shown · non-contrast
Comparison: CT angiography of the abdomen dated [DATE]

CLINICAL DATA: Metastatic carcinoid tumor to the liver.  The
patient presents for arteriography and possible embolization prior
to planned [CH] radioembolization of the liver.

[Series 1: sp us guide vasc access*right* · 1 of 1 slices shown]
[im 1/1]
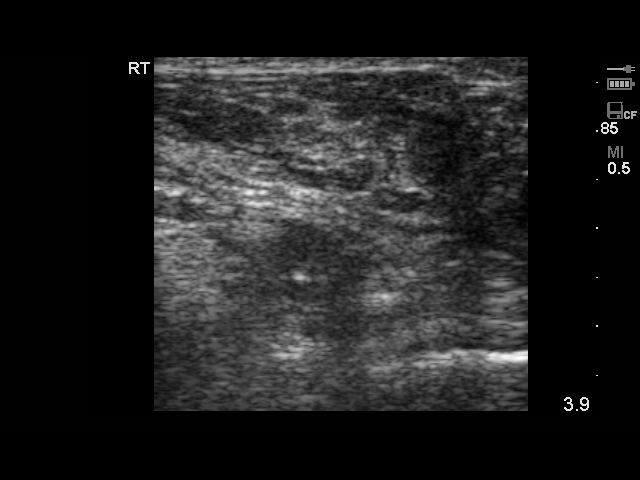

[1 of 1 positions shown; findings below may reference images not displayed]

1.  ULTRASOUND GUIDANCE FOR VASCULAR ACCESS OF THE RIGHT COMMON
FEMORAL ARTERY
2.  VISCERAL ARTERIOGRAPHY OF THE CELIAC AXIS
3.  ADDITIONAL SELECTIVE ARTERIOGRAPHY OF THREE ADDITIONAL SECOND
ORDER TRUNKS OF THE CELIAC AXIS
4.  TRANSCATHETER EMBOLIZATION OF THE GASTRODUODENAL ARTERY
5.  FOLLOW-UP ARTERIOGRAPHY AFTER EMBOLIZATION
Sedation: 3.0 mg IV Versed; 100 mcg IV Fentanyl.

Total Moderate Sedation Time: 100 minutes.

Contrast:  140 ml [CH]

Fluoroscopy Time: 22.2 minutes.

Procedure:  The procedure, risks, benefits, and alternatives were
explained to the patient.  Questions regarding the procedure were
encouraged and answered.  The patient understands and consents to
the procedure.

The right groin was prepped with Betadine in a sterile fashion, and
a sterile drape was applied covering the operative field.  A
sterile gown and sterile gloves were used for the procedure. Local
anesthesia was provided with 1% Lidocaine.  Ultrasound image
documentation was performed.

Access of the right common femoral artery was performed under
direct ultrasound guidance with a micropuncture [DATE]-French
sheath was placed.  A 5-French Cobra catheter was advanced into the
abdominal aorta.  This was used to selectively catheterize the
celiac axis.  Celiac arteriography was performed.

Additional selective arteriography was performed of a right hepatic
arterial trunk, a proper hepatic arterial trunk supplying partial
left lobe supply and the gastroduodenal artery, and a left gastric
artery trunk providing left hepatic arterial supply.

Transcatheter embolization was performed of the gastroduodenal
artery through a microcatheter with deployment of microcoils of 3
mm, 4 mm and 5 mm diameter.  A total of six Interlock microcoils
were deployed in the gastroduodenal artery.  Periodic injection of
contrast was performed during embolization and a follow up
arteriogram also performed to assess occlusion of the vessel.

After the procedure, catheter and sheath were removed and
hemostasis obtained with manual compression and use of a V-pad.

Complications: None
FINDINGS: As depicted by CTA, there is normal variant of hepatic
arterial anatomy.  The left gastric artery trunk is the first
branch off the celiac axis and extends superiorly.  This supplies
the lesser curvature of the stomach as well as majority of the left
lobe of the liver with a large trunk present extending into the
lateral segment of the left lobe and also the demonstrating distal
branches supplying the medial segment.

The celiac trunk shows a trifurcation between the splenic artery
and two additional trunks extending towards the liver and distal
stomach.  The largest trunk supplies the entire right lobe of the
liver.  The second trunk terminates as the gastroduodenal artery
and gastroepiploic artery and also supplies an accessory left
hepatic arterial branch extending into the medial segment of the
left lobe.

Arteriography of the mid right hepatic artery trunk demonstrates
hypervascularity at the level of dominant tumor in the inferior
aspect of the right lobe.  Technetium 99m MAA injection was
performed in the right hepatic artery in preparation for
radioembolization and to quantitate distribution of the
radionuclide in the liver and lungs after the procedure.

Transcatheter embolization of the gastroduodenal artery was
performed successfully resulting in complete occlusion.  The
accessory left hepatic artery branch was left open and originates
proximal to the coil mass.

Left gastric artery injection was also performed.  Multiple trunks
extend to the stomach and it is not feasible to embolize all of the
gastric supply.  The left lobe hepatic artery supply was left open
in the event that future left lobe treatment may be necessary.
IMPRESSION: Celiac and hepatic arteriography performed as above with multiple
subselective arteriograms performed to fully define hepatic
arterial supply.  Transcatheter embolization of the gastroduodenal
artery was performed.  The current plan is to treat the dominant
trunk supplying the entire right lobe of the liver given tumor
distribution.  MAA injection was also performed at this level for
additional nuclear SPECT imaging which is to be dictated
separately.  Left lobe supply was left open in the event that
future treatment of the left lobe becomes necessary.

## 2010-05-18 IMAGING — NM NM LIVER IMG SPECT
3 series · 19 of 25 positions shown · non-contrast
Comparison: CTA [DATE]

[DATE] –DUPLICATE COPY for exam association in RIS. No change from original report.
CLINICAL DATA: Carcinoid metastasis with unresectable liver
 metastases.

 NUCLEAR MEDICINE LIVER SCAN with SPECT
TECHNIQUE: Abdominal images were obtained in multiple projections
 after intrahepatic arterial injection of radiopharmaceutical. SPECT
 imaging was performed. Lung shunt calculation was performed using
 geometric mean.
 Radiopharmaceutical: 5.0 mCi [F1] MAA.

[ct axial-sc arterial/portal venous · arterial · 6 of 126 slices shown]
[im 1/126]
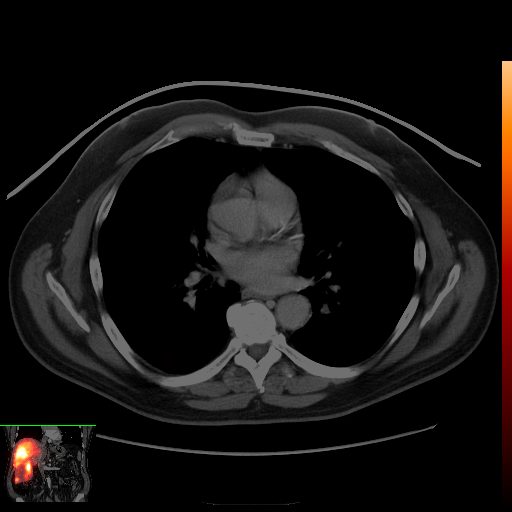
[im 18/126]
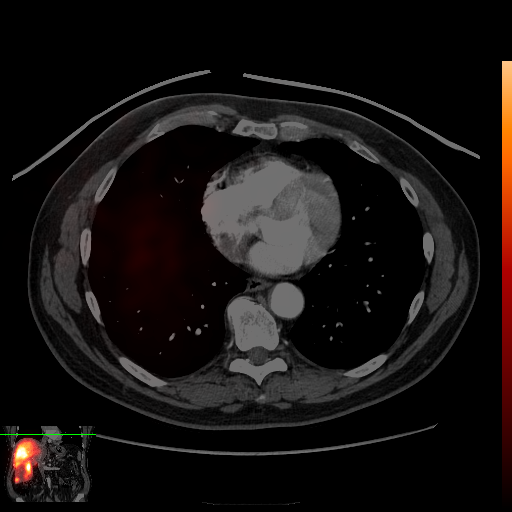
[im 54/126]
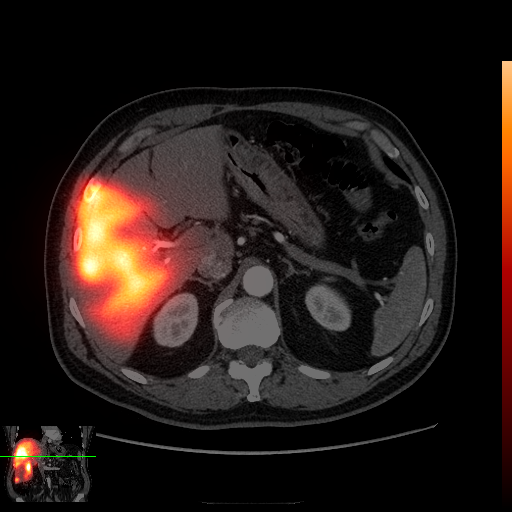
[im 72/126]
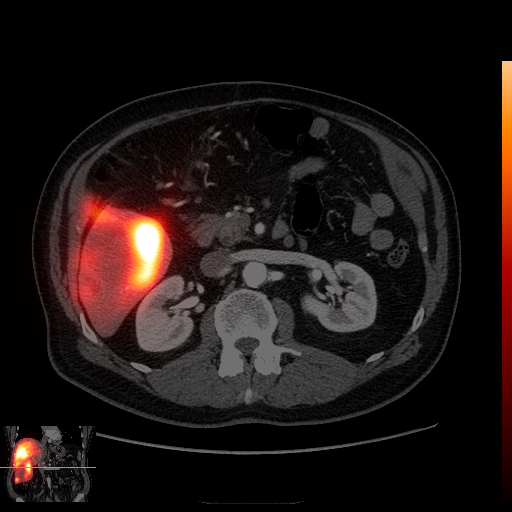
[im 90/126]
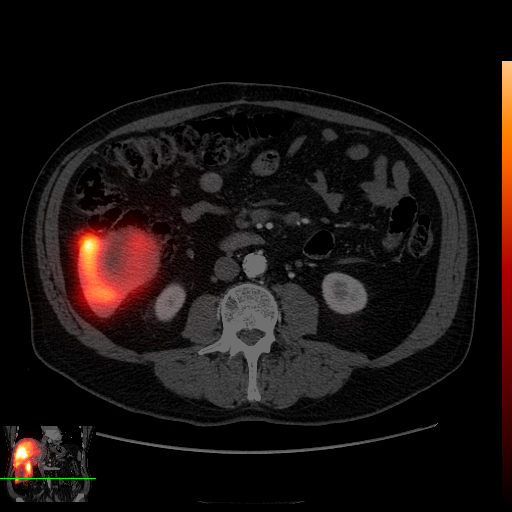
[im 126/126]
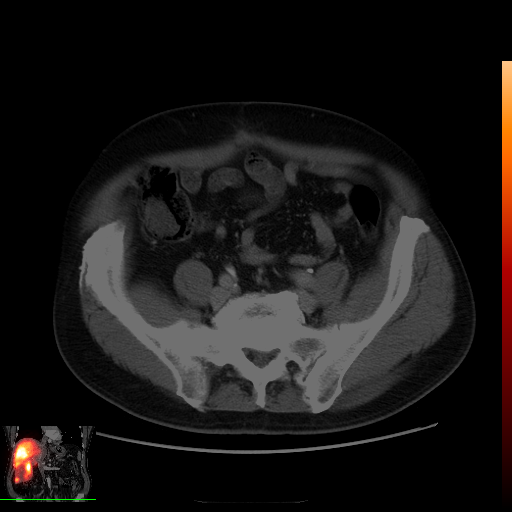

[ct sagittal-sc arterial/portal venous · arterial · 8 of 156 slices shown]
[im 1/156]
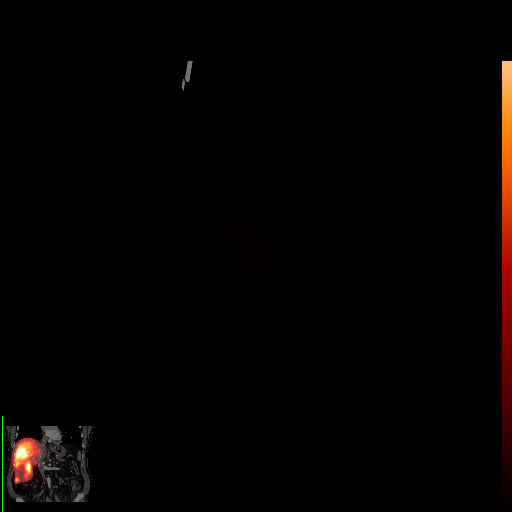
[im 18/156]
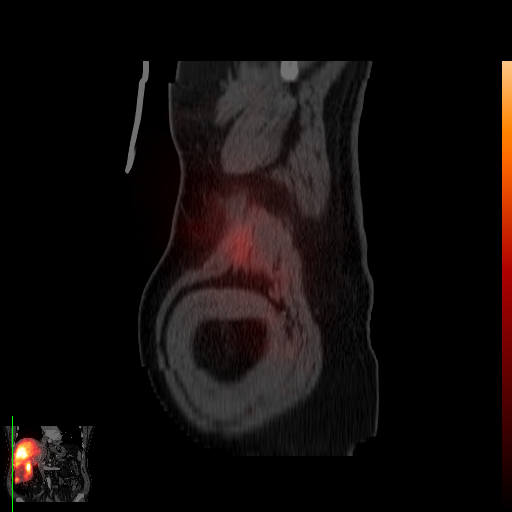
[im 52/156]
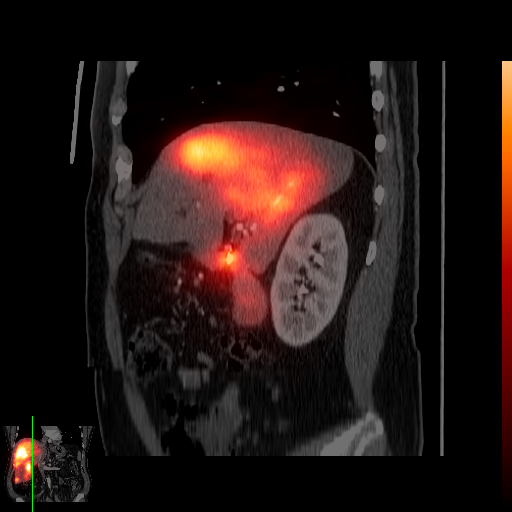
[im 69/156]
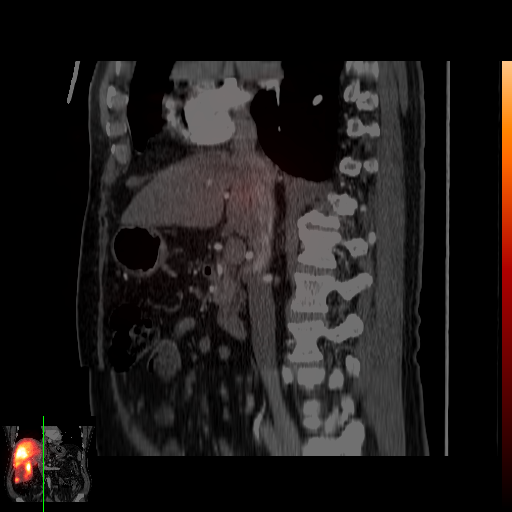
[im 87/156]
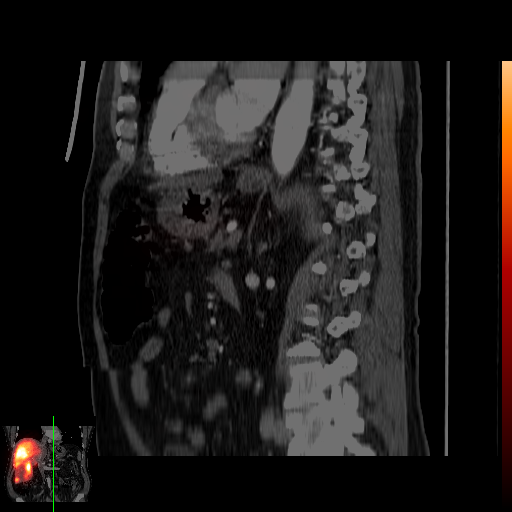
[im 121/156]
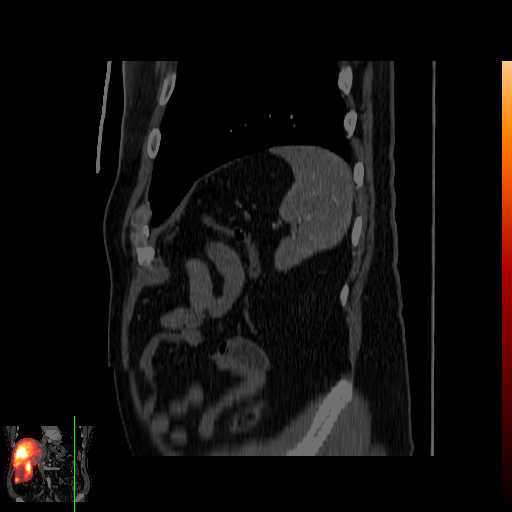
[im 138/156]
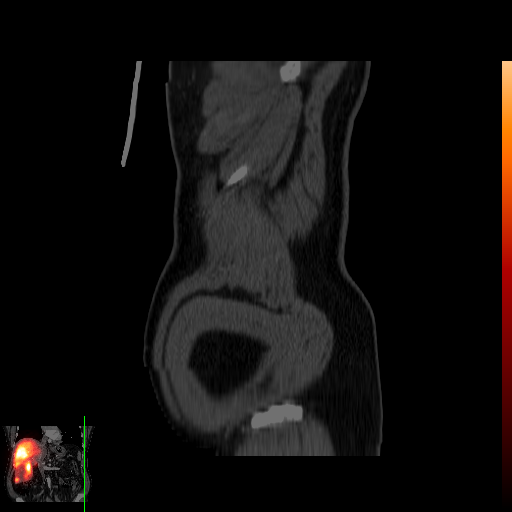
[im 156/156]
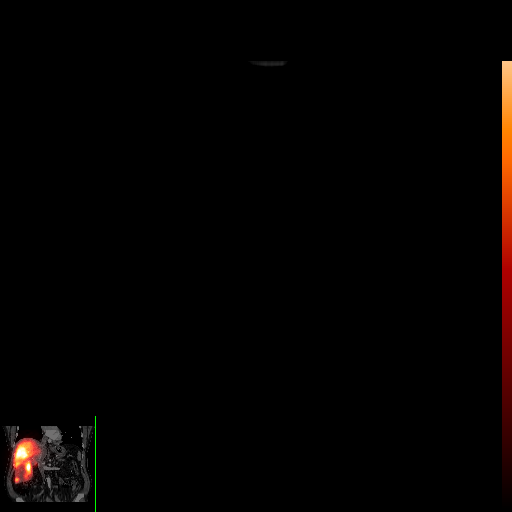

[ct coronal-sc arterial/portal venous · arterial · 5 of 115 slices shown]
[im 20/115]
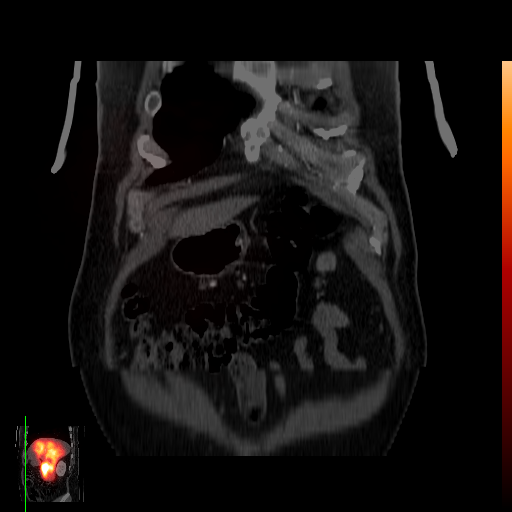
[im 39/115]
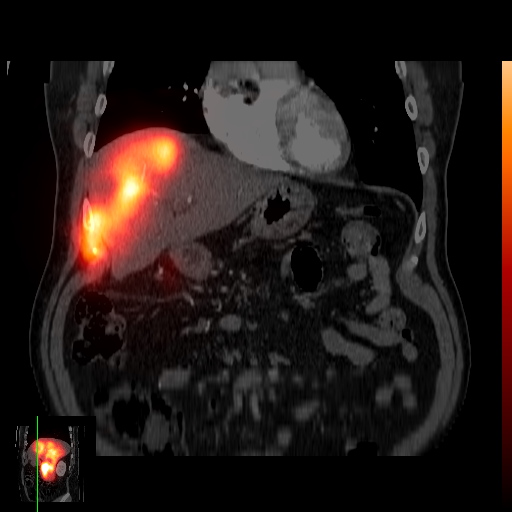
[im 58/115]
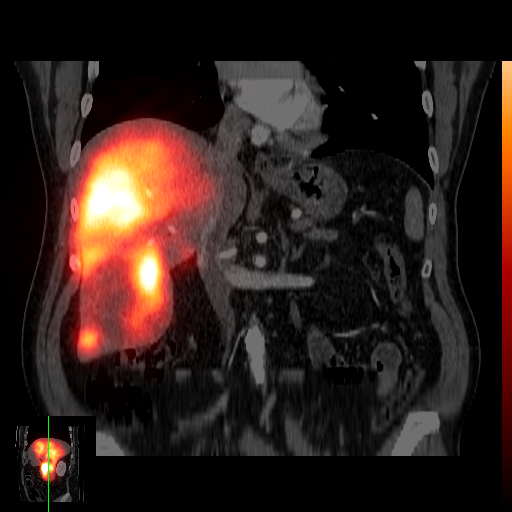
[im 96/115]
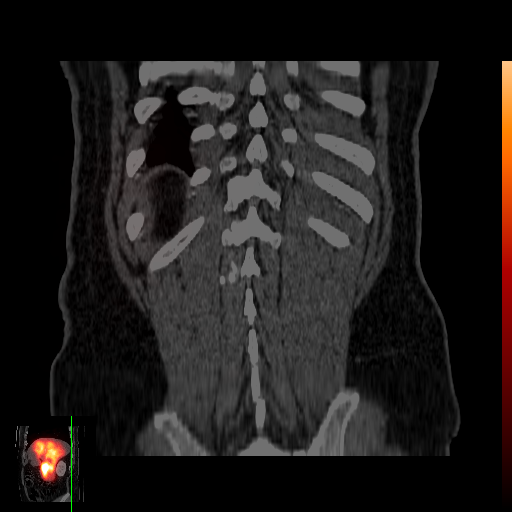
[im 115/115]
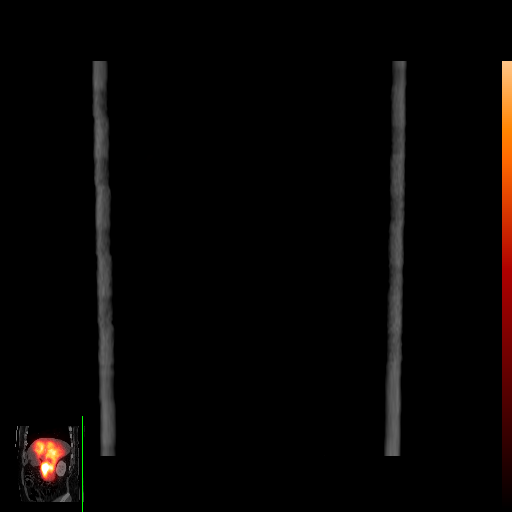

[19 of 25 positions shown; findings below may reference images not displayed]

FINDINGS: The interarterial injected microaggregated albumin
 localizes within the liver.

 No evidence of activity within the stomach, duodenum, or bowel.

 Calculated shunt fraction to the lungs equals 2.0%
IMPRESSION: 1. No significant extrahepatic radiotracer activity following
 intrahepatic arterial injection of MAA.
 2. Lung shunt fraction equals 2.0%

## 2010-05-18 IMAGING — XA IR US GUIDE VASC ACCESS RIGHT
6 series · 12 of 24 positions shown · IV contrast (IODINE)
Comparison: CT angiography of the abdomen dated [DATE]

CLINICAL DATA: Metastatic carcinoid tumor to the liver.  The
patient presents for arteriography and possible embolization prior
to planned [CH] radioembolization of the liver.

[Series 1: visceral · 1 of 34 frames shown (1 of 3)]
[frame 18/34]
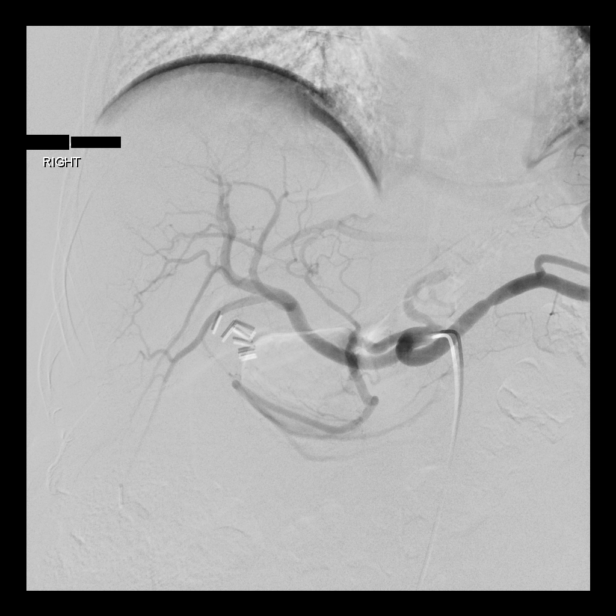

[Series 2: visceral · 2 of 38 frames shown (2 of 3)]
[frame 6/38]
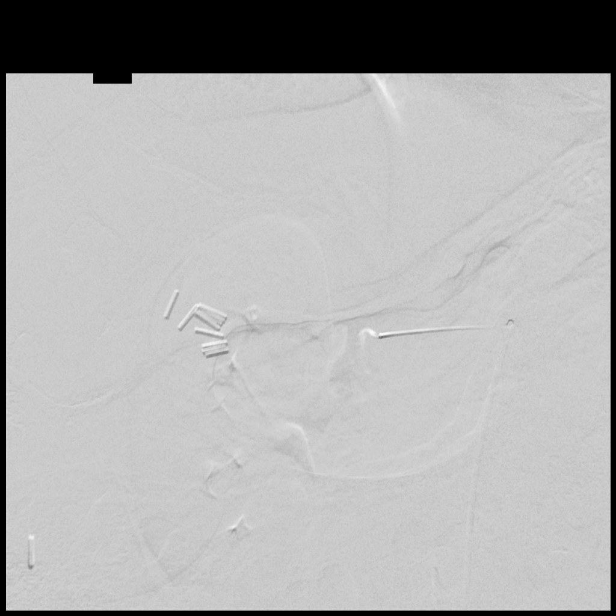
[frame 29/38]
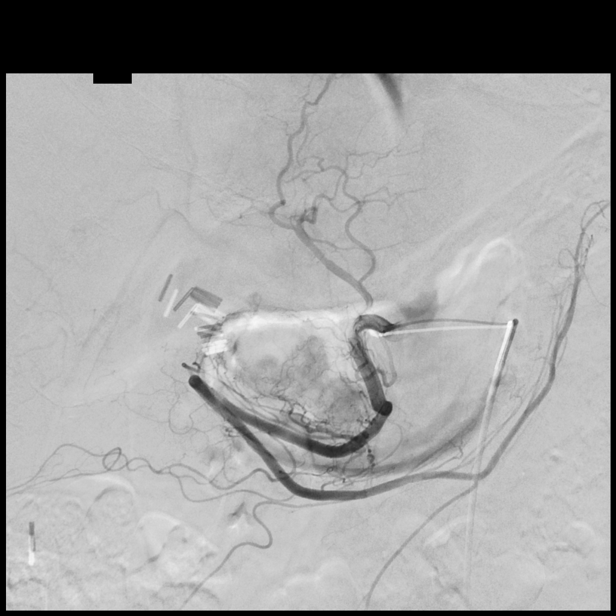

[Series 3: visceral · 1 of 45 frames shown (3 of 3)]
[frame 23/45]
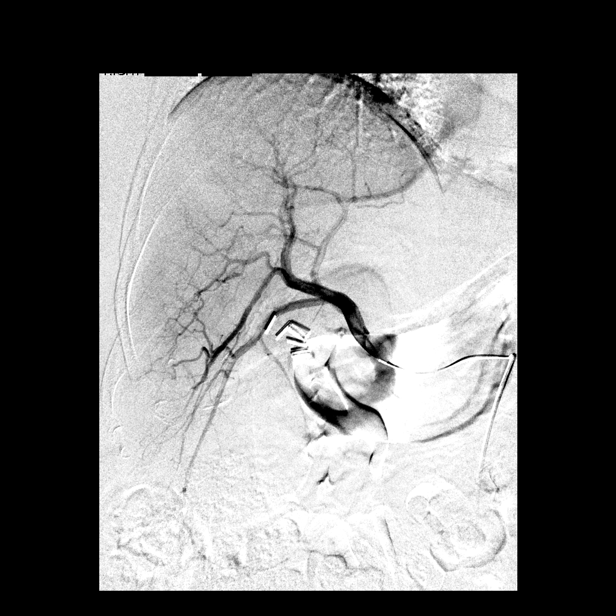

[Series 8: aorta · 2 of 14 frames shown (1 of 2)]
[frame 2/14]
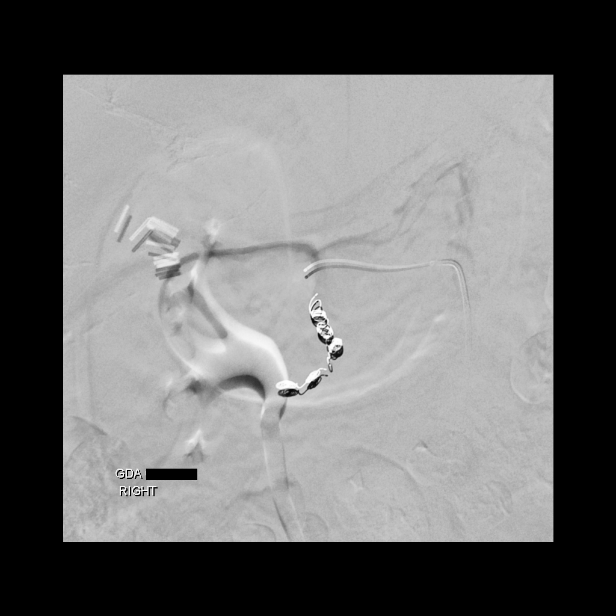
[frame 8/14]
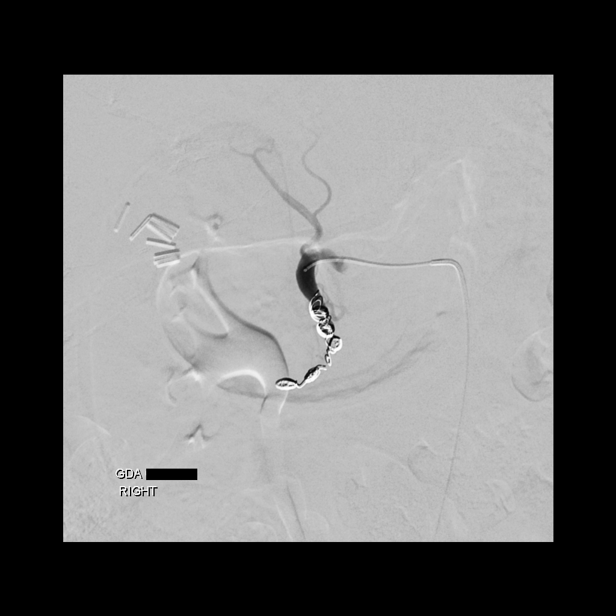

[Series 9: aorta · 2 of 23 frames shown (2 of 2)]
[frame 12/23]
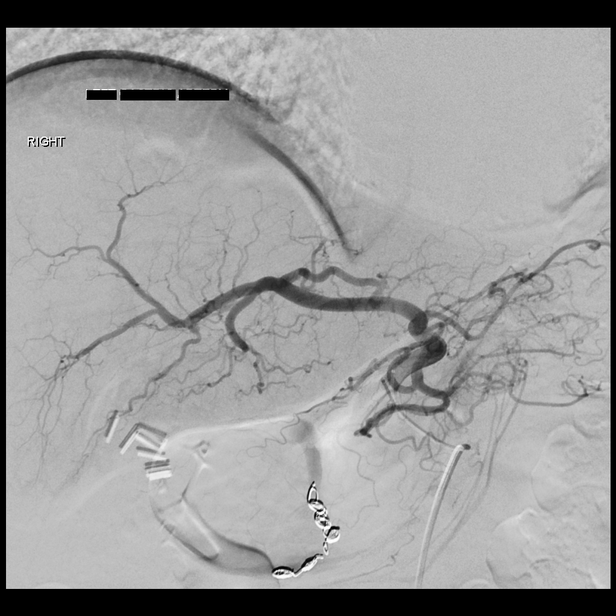
[frame 20/23]
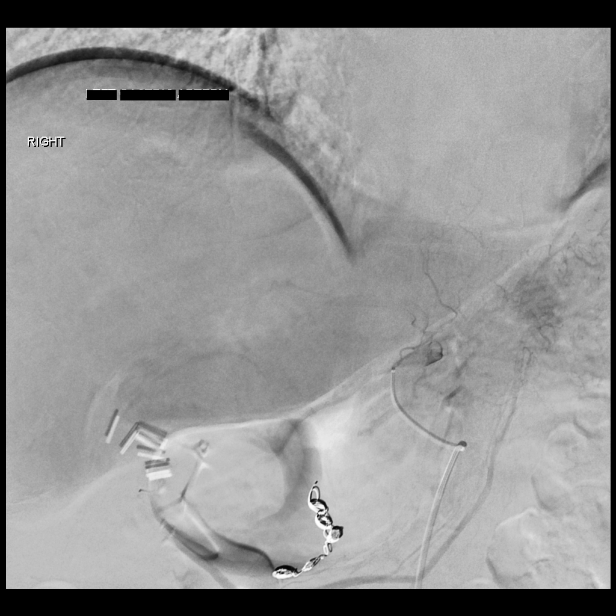

[Series 300: sp angio/visceral · 4 of 10 slices shown]
[im 3/10]
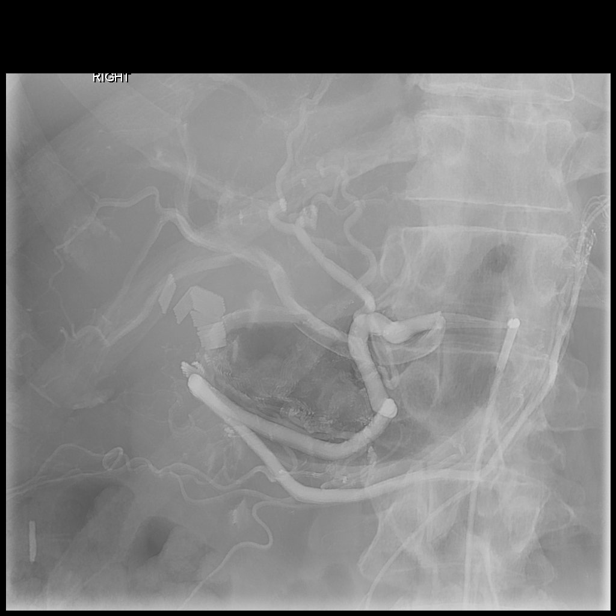
[im 5/10]
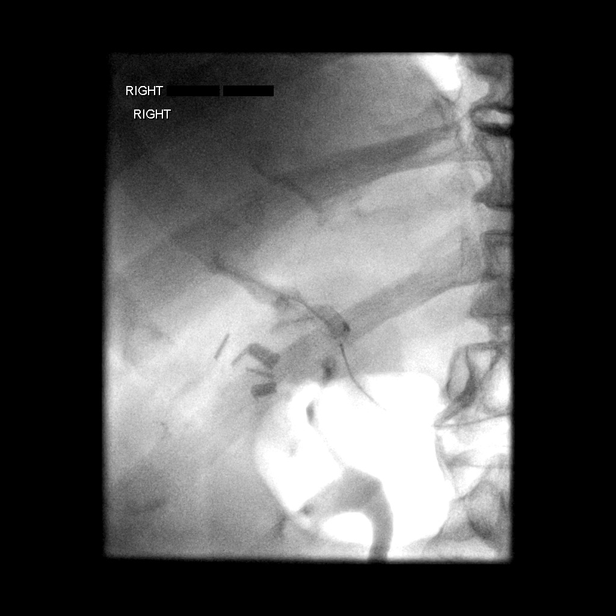
[im 8/10]
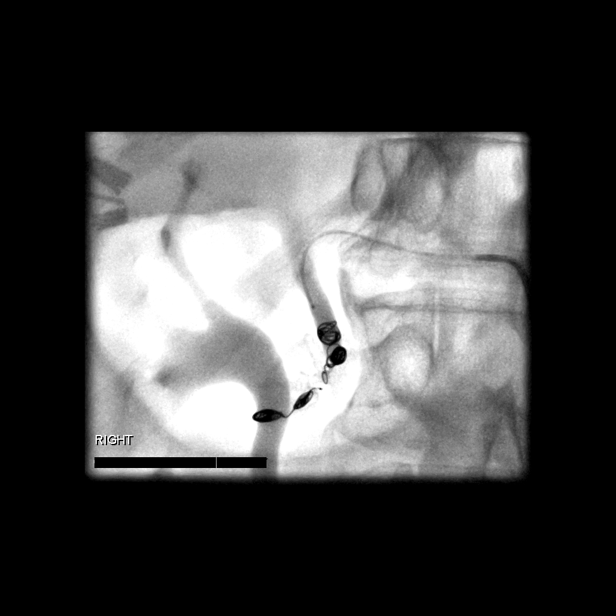
[im 10/10]
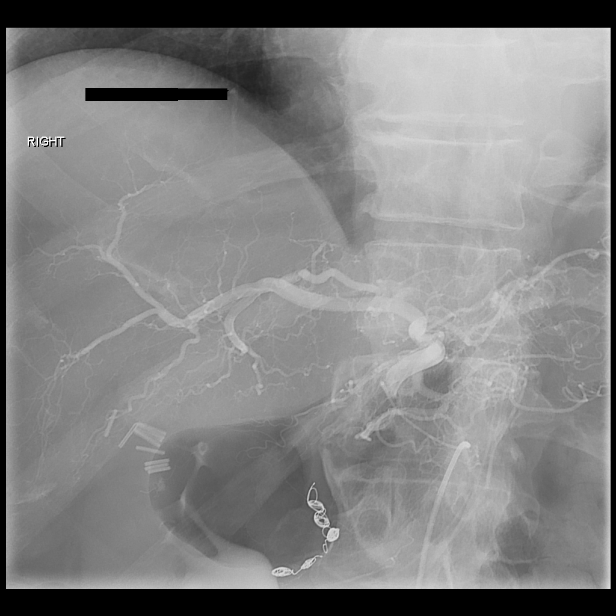

[12 of 24 positions shown; findings below may reference images not displayed]

1.  ULTRASOUND GUIDANCE FOR VASCULAR ACCESS OF THE RIGHT COMMON
FEMORAL ARTERY
2.  VISCERAL ARTERIOGRAPHY OF THE CELIAC AXIS
3.  ADDITIONAL SELECTIVE ARTERIOGRAPHY OF THREE ADDITIONAL SECOND
ORDER TRUNKS OF THE CELIAC AXIS
4.  TRANSCATHETER EMBOLIZATION OF THE GASTRODUODENAL ARTERY
5.  FOLLOW-UP ARTERIOGRAPHY AFTER EMBOLIZATION
Sedation: 3.0 mg IV Versed; 100 mcg IV Fentanyl.

Total Moderate Sedation Time: 100 minutes.

Contrast:  140 ml [CH]

Fluoroscopy Time: 22.2 minutes.

Procedure:  The procedure, risks, benefits, and alternatives were
explained to the patient.  Questions regarding the procedure were
encouraged and answered.  The patient understands and consents to
the procedure.

The right groin was prepped with Betadine in a sterile fashion, and
a sterile drape was applied covering the operative field.  A
sterile gown and sterile gloves were used for the procedure. Local
anesthesia was provided with 1% Lidocaine.  Ultrasound image
documentation was performed.

Access of the right common femoral artery was performed under
direct ultrasound guidance with a micropuncture [DATE]-French
sheath was placed.  A 5-French Cobra catheter was advanced into the
abdominal aorta.  This was used to selectively catheterize the
celiac axis.  Celiac arteriography was performed.

Additional selective arteriography was performed of a right hepatic
arterial trunk, a proper hepatic arterial trunk supplying partial
left lobe supply and the gastroduodenal artery, and a left gastric
artery trunk providing left hepatic arterial supply.

Transcatheter embolization was performed of the gastroduodenal
artery through a microcatheter with deployment of microcoils of 3
mm, 4 mm and 5 mm diameter.  A total of six Interlock microcoils
were deployed in the gastroduodenal artery.  Periodic injection of
contrast was performed during embolization and a follow up
arteriogram also performed to assess occlusion of the vessel.

After the procedure, catheter and sheath were removed and
hemostasis obtained with manual compression and use of a V-pad.

Complications: None
FINDINGS: As depicted by CTA, there is normal variant of hepatic
arterial anatomy.  The left gastric artery trunk is the first
branch off the celiac axis and extends superiorly.  This supplies
the lesser curvature of the stomach as well as majority of the left
lobe of the liver with a large trunk present extending into the
lateral segment of the left lobe and also the demonstrating distal
branches supplying the medial segment.

The celiac trunk shows a trifurcation between the splenic artery
and two additional trunks extending towards the liver and distal
stomach.  The largest trunk supplies the entire right lobe of the
liver.  The second trunk terminates as the gastroduodenal artery
and gastroepiploic artery and also supplies an accessory left
hepatic arterial branch extending into the medial segment of the
left lobe.

Arteriography of the mid right hepatic artery trunk demonstrates
hypervascularity at the level of dominant tumor in the inferior
aspect of the right lobe.  Technetium 99m MAA injection was
performed in the right hepatic artery in preparation for
radioembolization and to quantitate distribution of the
radionuclide in the liver and lungs after the procedure.

Transcatheter embolization of the gastroduodenal artery was
performed successfully resulting in complete occlusion.  The
accessory left hepatic artery branch was left open and originates
proximal to the coil mass.

Left gastric artery injection was also performed.  Multiple trunks
extend to the stomach and it is not feasible to embolize all of the
gastric supply.  The left lobe hepatic artery supply was left open
in the event that future left lobe treatment may be necessary.
IMPRESSION: Celiac and hepatic arteriography performed as above with multiple
subselective arteriograms performed to fully define hepatic
arterial supply.  Transcatheter embolization of the gastroduodenal
artery was performed.  The current plan is to treat the dominant
trunk supplying the entire right lobe of the liver given tumor
distribution.  MAA injection was also performed at this level for
additional nuclear SPECT imaging which is to be dictated
separately.  Left lobe supply was left open in the event that
future treatment of the left lobe becomes necessary.

## 2010-05-26 ENCOUNTER — Ambulatory Visit (HOSPITAL_COMMUNITY): Admission: RE | Admit: 2010-05-26 | Discharge: 2010-05-26 | Payer: Self-pay | Admitting: Interventional Radiology

## 2010-05-26 IMAGING — US IR US GUIDE VASC ACCESS RIGHT
1 series · 1 of 1 positions shown · non-contrast
Comparison: none

CLINICAL DATA: Metastatic carcinoid to the liver.  The patient
presents for [U5] radioembolization.

[Series 1: sp us guide vasc access*right* · 1 of 1 slices shown]
[im 1/1]
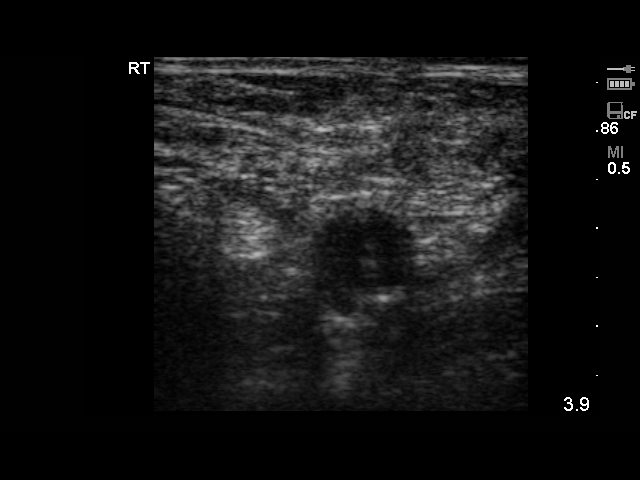

[1 of 1 positions shown; findings below may reference images not displayed]

1.  ULTRASOUND GUIDANCE FOR VASCULAR ACCESS OF THE RIGHT COMMON
FEMORAL ARTERY
2.  HEPATIC ARTERIOGRAPHY WITH INJECTION OF TWO SEPARATE SECOND
ORDER HEPATIC ARTERY TRUNK
3.  TRANSCATHETER RADIOEMBOLIZATION OF THE RIGHT LOBE OF THE LIVER
4.  FOLLOW-UP ANGIOGRAPHY DURING AND AFTER EMBOLIZATION

Sedation: 6.0 mg IV Versed; 137.5 mcg IV Fentanyl.

Total Moderate Sedation Time: 205 minutes.

Contrast:  223 ml consisting of 73 ml Visipaque 320 and 150 ml
[U5]

Additional Medications: 3.375 grams IV Zosyn, 20 mg IV Decadron,
200 mcg IV Sandostatin, 40 mg IV Protonix, 30 mg IV Toradol

Fluoroscopy Time: 56.2 minutes.

Procedure:  The procedure, risks, benefits, and alternatives were
explained to the patient.  Questions regarding the procedure were
encouraged and answered.  The patient understands and consents to
the procedure.

The right groin was prepped with betadine in a sterile fashion, and
a sterile drape was applied covering the operative field.  A
sterile gown and sterile gloves were used for the procedure. Local
anesthesia was provided with 1% Lidocaine.  Ultrasound image
documentation was performed.  Fluoroscopy during the procedure and
fluoro spot radiograph confirms appropriate catheter position.

The right common femoral artery was accessed under ultrasound
guidance with a micropuncture [DATE]-French sheath was placed.
A 5-French Cobra catheter was advanced into the abdominal aorta.
This was used to selectively catheterize the celiac axis.  Contrast
injection was performed.  A microcatheter was advanced into a
selective hepatic arterial trunk.  Arteriography was performed
through the microcatheter.

Additional catheterization with a microcatheter was then performed
of the right hepatic artery trunk.  Selective arteriography was
performed.  The catheter was advanced over a guidewire.  [U5]
radioembolization was then performed with injection of SIR Spheres
under fluoroscopy with periodic utilization of contrast to assess
antegrade flow.  Dosing is separately documented in a nuclear
medicine report.

Contrast injections were performed during and after
radioembolization to assess flow.  After administration of the full
[U5] dose, catheters were removed and discarded.  Oblique
arteriography was performed of the right femoral access site via
the sheath.  The sheath was then reprepped with Betadine.
Arteriotomy closure was performed with the StarClose SE device.
Additional manual compression was held with use of a V-pad.

Complications: None
FINDINGS: Initial injection of the hepatic arterial trunk supplying
part of the left lobe of the liver as well as the gastroduodenal
artery shows stable complete occlusion of the gastroduodenal artery
after prior coil embolization.  Supply to the left lobe of the
liver remains open.

It was very difficult to catheterize the trunk supplying the right
lobe of the liver given anatomy and unusual takeoff of the vessel.
Eventually, a microcatheter was able to be advanced to a position
just below the first branch of the right hepatic artery.  Normal
antegrade flow was present prior to beginning radioembolization.

During SIR Sphere administration, flow slowed considerably in the
right hepatic arterial tree.  Dosing was administered very slowly
with antegrade flow maintained throughout the treatment.

Deployment of the StarClose device was not successful in obtaining
complete hemostasis at the level of the femoral arteriotomy.
Hemostasis was obtained after additional manual compression.
IMPRESSION: Successful [U5] radioembolization of the right lobe of the
liver.  The patient was observed for 6 hours following the
procedure and demonstrated no complications or significant
symptoms.  He was discharged and initial clinical follow-up will be
performed in 2 weeks.

## 2010-05-26 IMAGING — XA IR US GUIDE VASC ACCESS RIGHT
8 series · 13 of 24 positions shown · IV contrast (IODINE)
Comparison: none

CLINICAL DATA: Metastatic carcinoid to the liver.  The patient
presents for [U5] radioembolization.

[Series 1: visceral · 2 of 37 frames shown (1 of 3)]
[frame 6/37]
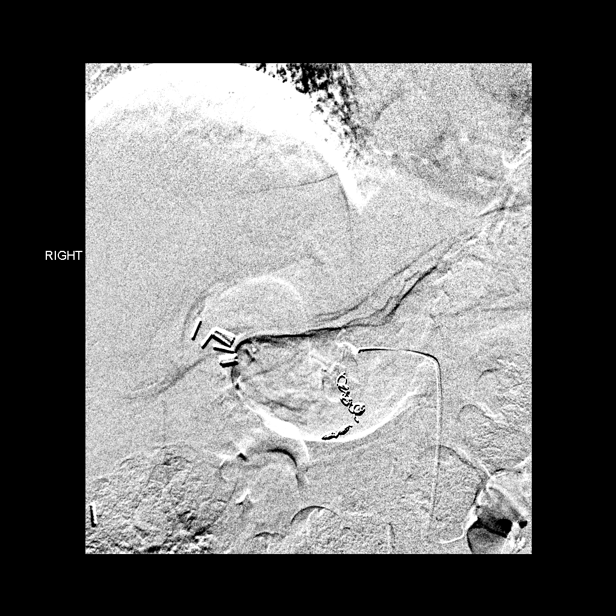
[frame 37/37]
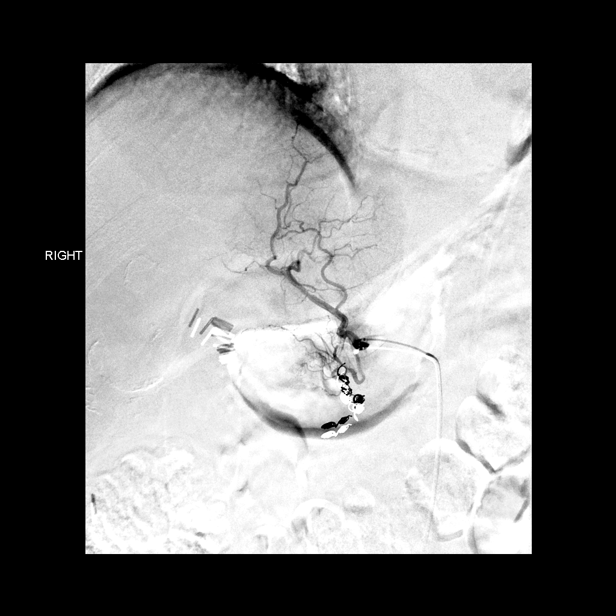

[Series 2: aorta · 1 of 10 frames shown (1 of 4)]
[frame 8/10]
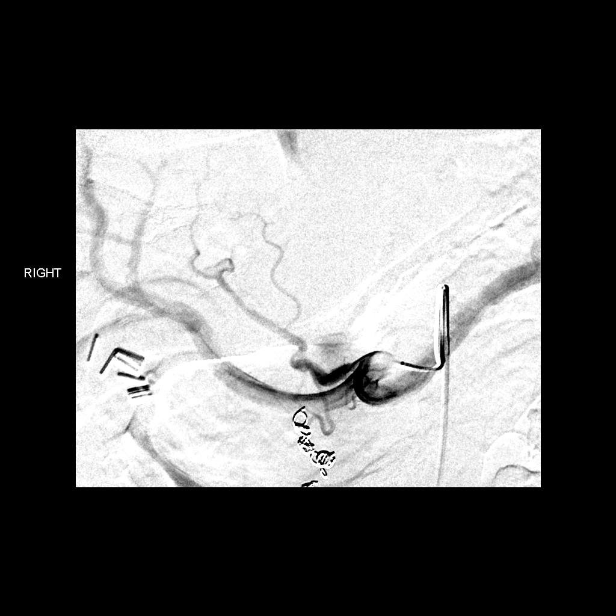

[Series 3: aorta · 2 of 10 frames shown (2 of 4)]
[frame 2/10]
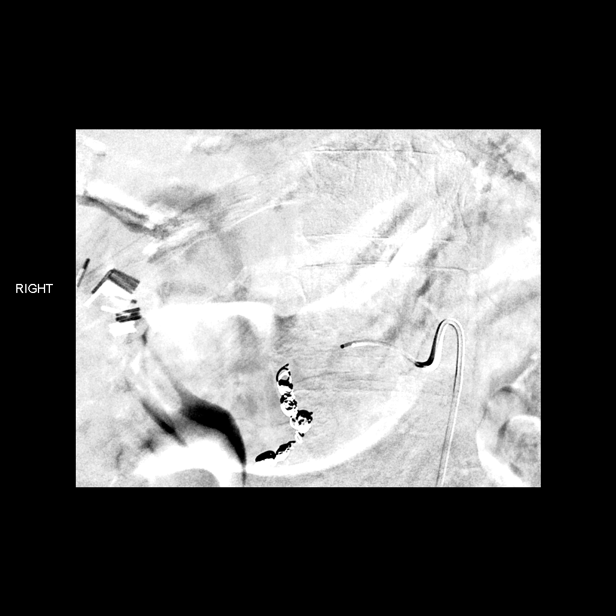
[frame 9/10]
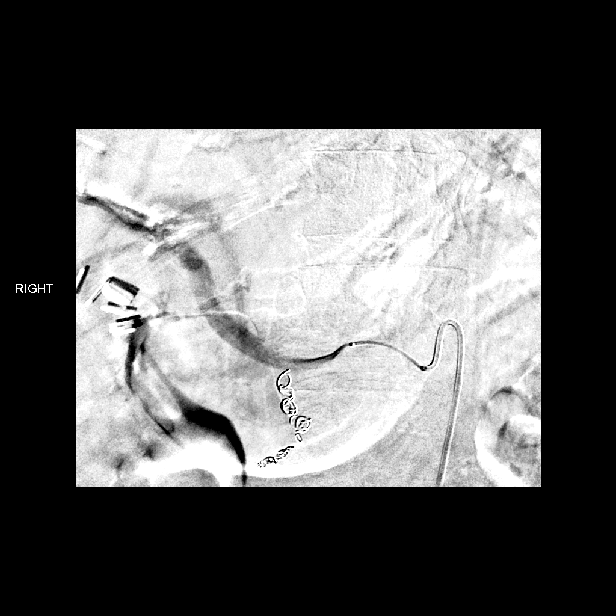

[Series 4: aorta · 1 of 13 frames shown (3 of 4)]
[frame 10/13]
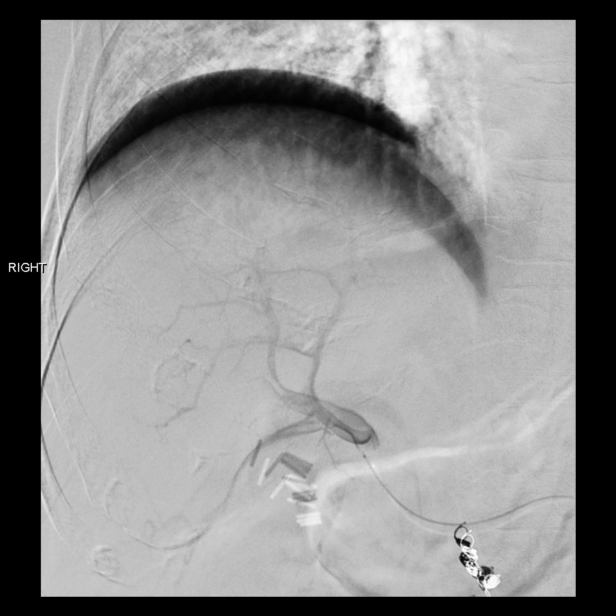

[Series 5: visceral · 2 of 45 frames shown (2 of 3)]
[frame 23/45]
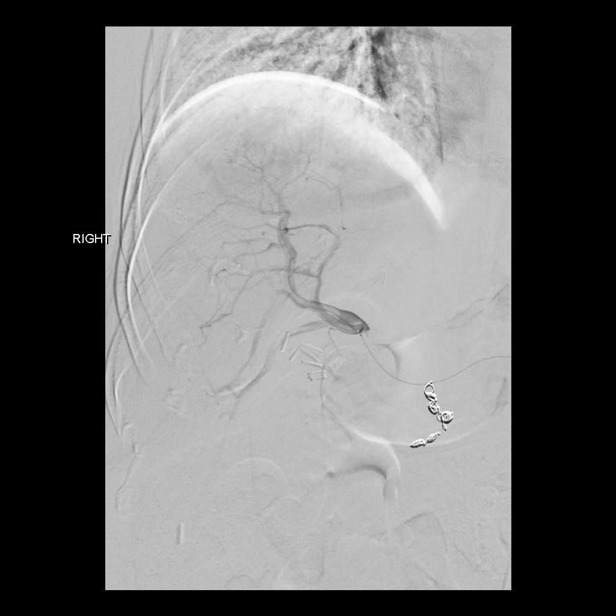
[frame 39/45]
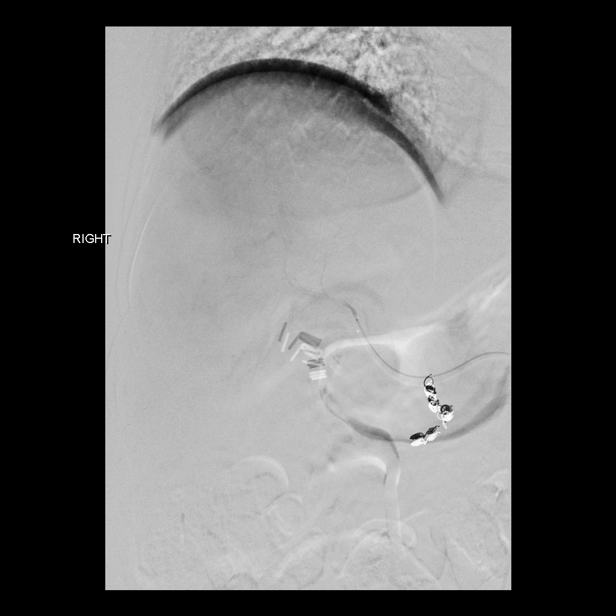

[Series 6: visceral · 1 of 35 frames shown (3 of 3)]
[frame 18/35]
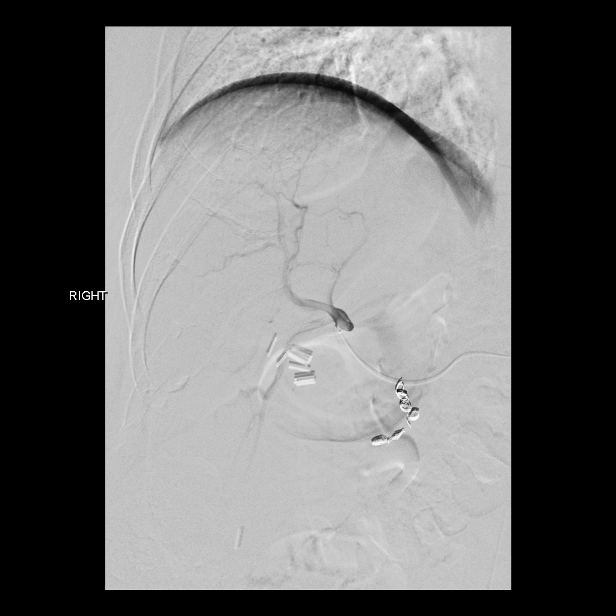

[Series 11: aorta · 2 of 10 frames shown (4 of 4)]
[frame 2/10]
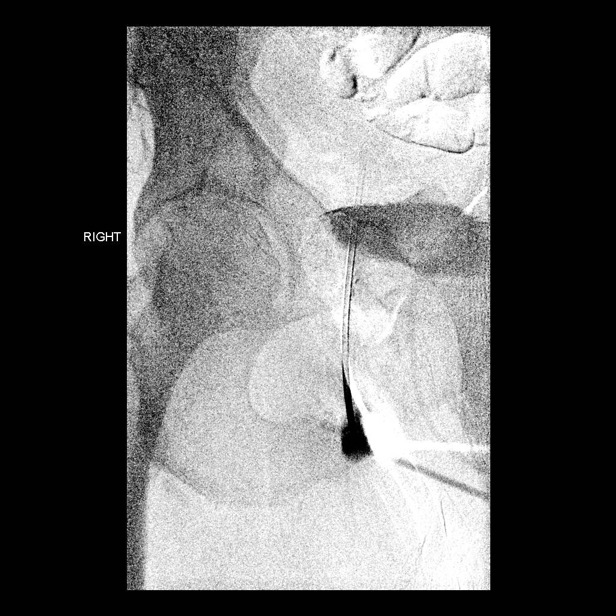
[frame 9/10]
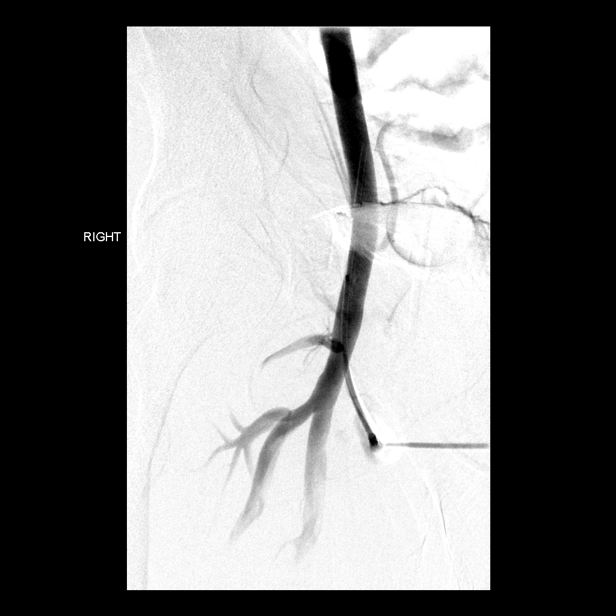

[Series 300: abdomen · 2 of 5 slices shown]
[im 2/5]
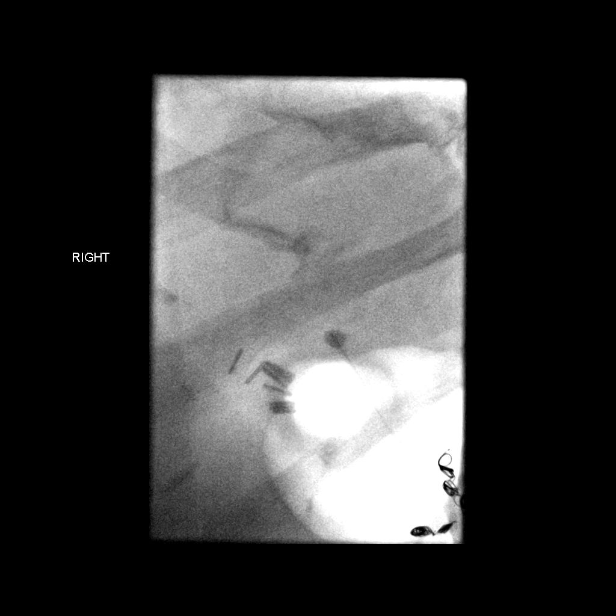
[im 5/5]
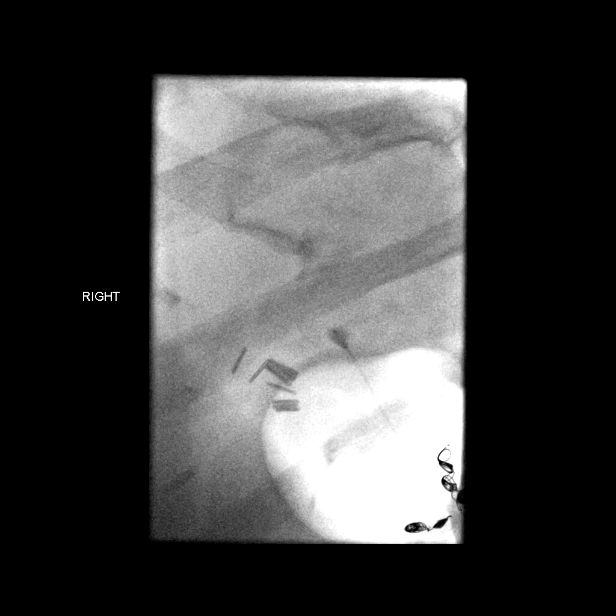

[13 of 24 positions shown; findings below may reference images not displayed]

1.  ULTRASOUND GUIDANCE FOR VASCULAR ACCESS OF THE RIGHT COMMON
FEMORAL ARTERY
2.  HEPATIC ARTERIOGRAPHY WITH INJECTION OF TWO SEPARATE SECOND
ORDER HEPATIC ARTERY TRUNK
3.  TRANSCATHETER RADIOEMBOLIZATION OF THE RIGHT LOBE OF THE LIVER
4.  FOLLOW-UP ANGIOGRAPHY DURING AND AFTER EMBOLIZATION

Sedation: 6.0 mg IV Versed; 137.5 mcg IV Fentanyl.

Total Moderate Sedation Time: 205 minutes.

Contrast:  223 ml consisting of 73 ml Visipaque 320 and 150 ml
[U5]

Additional Medications: 3.375 grams IV Zosyn, 20 mg IV Decadron,
200 mcg IV Sandostatin, 40 mg IV Protonix, 30 mg IV Toradol

Fluoroscopy Time: 56.2 minutes.

Procedure:  The procedure, risks, benefits, and alternatives were
explained to the patient.  Questions regarding the procedure were
encouraged and answered.  The patient understands and consents to
the procedure.

The right groin was prepped with betadine in a sterile fashion, and
a sterile drape was applied covering the operative field.  A
sterile gown and sterile gloves were used for the procedure. Local
anesthesia was provided with 1% Lidocaine.  Ultrasound image
documentation was performed.  Fluoroscopy during the procedure and
fluoro spot radiograph confirms appropriate catheter position.

The right common femoral artery was accessed under ultrasound
guidance with a micropuncture [DATE]-French sheath was placed.
A 5-French Cobra catheter was advanced into the abdominal aorta.
This was used to selectively catheterize the celiac axis.  Contrast
injection was performed.  A microcatheter was advanced into a
selective hepatic arterial trunk.  Arteriography was performed
through the microcatheter.

Additional catheterization with a microcatheter was then performed
of the right hepatic artery trunk.  Selective arteriography was
performed.  The catheter was advanced over a guidewire.  [U5]
radioembolization was then performed with injection of SIR Spheres
under fluoroscopy with periodic utilization of contrast to assess
antegrade flow.  Dosing is separately documented in a nuclear
medicine report.

Contrast injections were performed during and after
radioembolization to assess flow.  After administration of the full
[U5] dose, catheters were removed and discarded.  Oblique
arteriography was performed of the right femoral access site via
the sheath.  The sheath was then reprepped with Betadine.
Arteriotomy closure was performed with the StarClose SE device.
Additional manual compression was held with use of a V-pad.

Complications: None
FINDINGS: Initial injection of the hepatic arterial trunk supplying
part of the left lobe of the liver as well as the gastroduodenal
artery shows stable complete occlusion of the gastroduodenal artery
after prior coil embolization.  Supply to the left lobe of the
liver remains open.

It was very difficult to catheterize the trunk supplying the right
lobe of the liver given anatomy and unusual takeoff of the vessel.
Eventually, a microcatheter was able to be advanced to a position
just below the first branch of the right hepatic artery.  Normal
antegrade flow was present prior to beginning radioembolization.

During SIR Sphere administration, flow slowed considerably in the
right hepatic arterial tree.  Dosing was administered very slowly
with antegrade flow maintained throughout the treatment.

Deployment of the StarClose device was not successful in obtaining
complete hemostasis at the level of the femoral arteriotomy.
Hemostasis was obtained after additional manual compression.
IMPRESSION: Successful [U5] radioembolization of the right lobe of the
liver.  The patient was observed for 6 hours following the
procedure and demonstrated no complications or significant
symptoms.  He was discharged and initial clinical follow-up will be
performed in 2 weeks.

## 2010-05-26 IMAGING — NM NM LIVER IMG SPECT
1 series · 6 of 6 positions shown · non-contrast
Comparison: MAA scan [DATE]

CLINICAL DATA: [AGE] microspheres therapy for hepatic metastasis.

NUCLEAR MEDICINE LIVER SCAN SPECT
TECHNIQUE: Abdominal images were obtained in multiple projections
after intravenous injection of radiopharmaceutical.

[(hospital) non-circular ect · 4.71mm/px · 6 of 128 frames shown]
[frame 11/128]
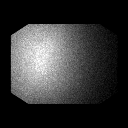
[frame 32/128]
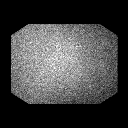
[frame 54/128]
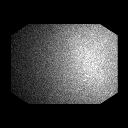
[frame 75/128]
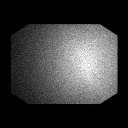
[frame 96/128]
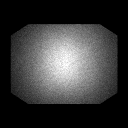
[frame 118/128]
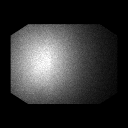

[6 of 6 positions shown; findings below may reference images not displayed]

FINDINGS: Bremsstrahlung  planar and SPECT imaging of the abdomen
following  intrahepatic arterial delivery of [JV]
demonstrates  radioactivity localized to the liver.  No evidence of
extrahepatic activity.
IMPRESSION: Bremssstrahlung scan demonstrates activity localized to the liver
with no extrahepatic activity identified.

## 2010-06-09 ENCOUNTER — Encounter: Admission: RE | Admit: 2010-06-09 | Discharge: 2010-06-09 | Payer: Self-pay | Admitting: Interventional Radiology

## 2010-06-11 ENCOUNTER — Ambulatory Visit: Payer: Self-pay | Admitting: Hematology & Oncology

## 2010-06-12 ENCOUNTER — Encounter: Payer: Self-pay | Admitting: Internal Medicine

## 2010-06-12 LAB — CBC WITH DIFFERENTIAL (CANCER CENTER ONLY)
BASO%: 0.4 % (ref 0.0–2.0)
EOS%: 6.1 % (ref 0.0–7.0)
LYMPH#: 0.7 10*3/uL — ABNORMAL LOW (ref 0.9–3.3)
MCHC: 33.8 g/dL (ref 32.0–35.9)
NEUT#: 3.8 10*3/uL (ref 1.5–6.5)
NEUT%: 73 % (ref 40.0–80.0)
Platelets: 226 10*3/uL (ref 145–400)
RDW: 12.1 % (ref 10.5–14.6)
WBC: 5.2 10*3/uL (ref 4.0–10.0)

## 2010-06-15 LAB — COMPREHENSIVE METABOLIC PANEL
ALT: 16 U/L (ref 0–53)
AST: 22 U/L (ref 0–37)
Calcium: 9.2 mg/dL (ref 8.4–10.5)
Chloride: 104 mEq/L (ref 96–112)
Creatinine, Ser: 0.82 mg/dL (ref 0.40–1.50)
Potassium: 4.2 mEq/L (ref 3.5–5.3)
Sodium: 139 mEq/L (ref 135–145)
Total Protein: 6.9 g/dL (ref 6.0–8.3)

## 2010-08-03 ENCOUNTER — Encounter (INDEPENDENT_AMBULATORY_CARE_PROVIDER_SITE_OTHER): Payer: Self-pay | Admitting: *Deleted

## 2010-08-05 ENCOUNTER — Encounter (INDEPENDENT_AMBULATORY_CARE_PROVIDER_SITE_OTHER): Payer: Self-pay | Admitting: *Deleted

## 2010-08-05 ENCOUNTER — Ambulatory Visit: Payer: Self-pay | Admitting: Hematology & Oncology

## 2010-08-07 ENCOUNTER — Ambulatory Visit: Payer: Self-pay | Admitting: Internal Medicine

## 2010-08-07 ENCOUNTER — Telehealth: Payer: Self-pay | Admitting: Internal Medicine

## 2010-08-07 LAB — CBC WITH DIFFERENTIAL (CANCER CENTER ONLY)
BASO%: 0.5 % (ref 0.0–2.0)
HCT: 40.5 % (ref 38.7–49.9)
LYMPH%: 18.1 % (ref 14.0–48.0)
MCH: 28.7 pg (ref 28.0–33.4)
MCV: 87 fL (ref 82–98)
MONO#: 0.5 10*3/uL (ref 0.1–0.9)
MONO%: 7.4 % (ref 0.0–13.0)
NEUT%: 69.2 % (ref 40.0–80.0)
RDW: 12.2 % (ref 10.5–14.6)
WBC: 6.1 10*3/uL (ref 4.0–10.0)

## 2010-08-13 LAB — CHROMOGRANIN A: Chromogranin A: 6 ng/mL (ref 1.9–15.0)

## 2010-08-13 LAB — COMPREHENSIVE METABOLIC PANEL
ALT: 22 U/L (ref 0–53)
Alkaline Phosphatase: 79 U/L (ref 39–117)
CO2: 27 mEq/L (ref 19–32)
Creatinine, Ser: 0.81 mg/dL (ref 0.40–1.50)
Glucose, Bld: 81 mg/dL (ref 70–99)
Total Bilirubin: 0.5 mg/dL (ref 0.3–1.2)

## 2010-08-14 ENCOUNTER — Telehealth: Payer: Self-pay | Admitting: Internal Medicine

## 2010-08-25 ENCOUNTER — Encounter: Admission: RE | Admit: 2010-08-25 | Discharge: 2010-08-25 | Payer: Self-pay | Admitting: Hematology & Oncology

## 2010-08-25 IMAGING — CT CT ABDOMEN WO/W CM
3 of 9 series · 13 of 36 positions shown, 18 images · IV contrast ([ID] OMNI 350)
Comparison: [DATE] CT and [DATE] CTA.

CLINICAL DATA: Intestinal cancer with liver metastasis.  Status
post [AGE].

CT ABDOMEN WITHOUT AND WITH CONTRAST
TECHNIQUE: Multidetector CT imaging of the abdomen was performed
following the standard protocol before and during bolus
administration of intravenous contrast.
Contrast: 100 ml Omnipaque 350

[Series 5: arterial/portal venous · axial · arterial · 0.78mm/px · z∈[-240,-40]mm · 5 of 182 slices shown, 10 images]
[im 31/182  soft-tissue]
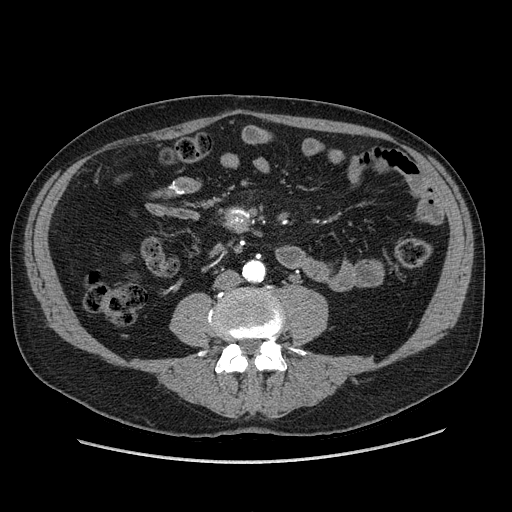
[im 31/182  bone]
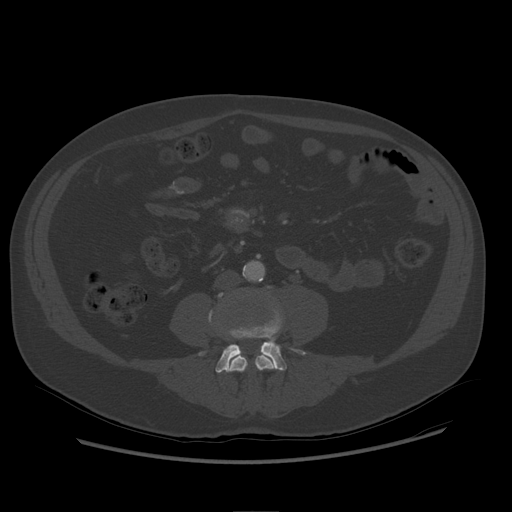
[im 61/182  soft-tissue]
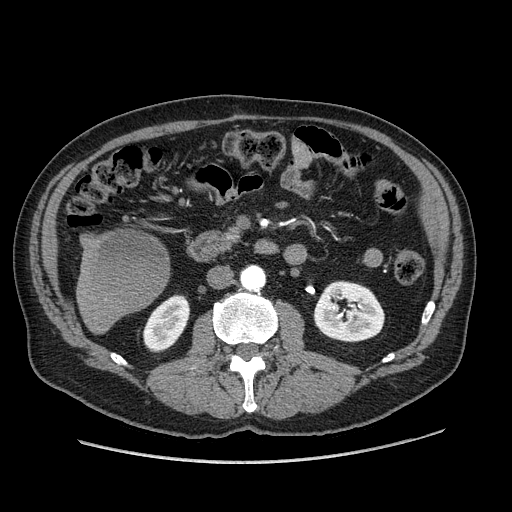
[im 61/182  lung]
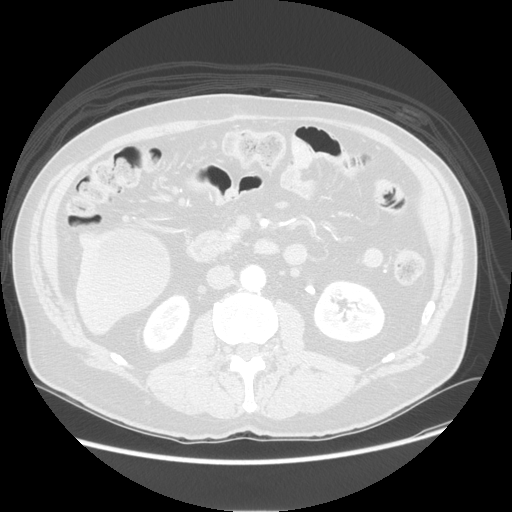
[im 91/182  soft-tissue]
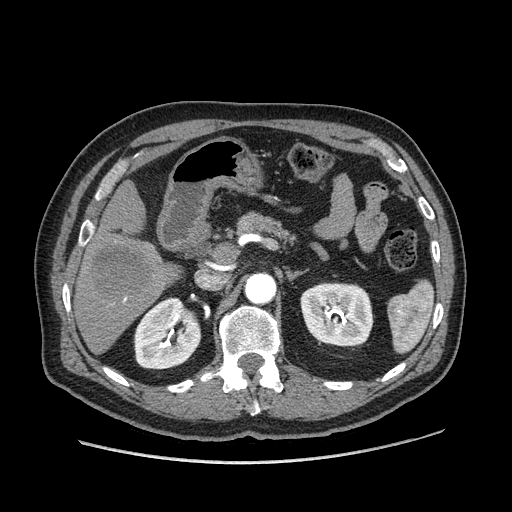
[im 91/182  lung]
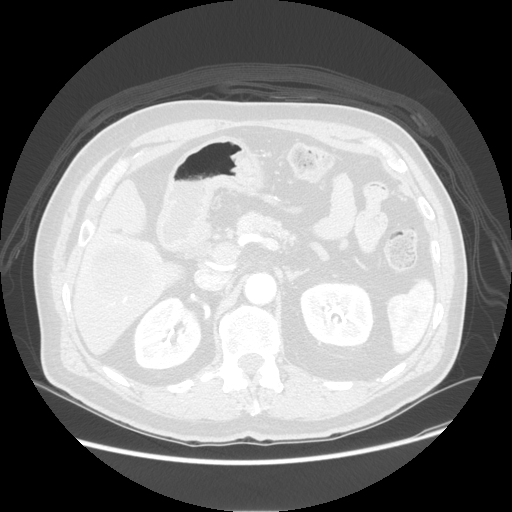
[im 121/182  soft-tissue]
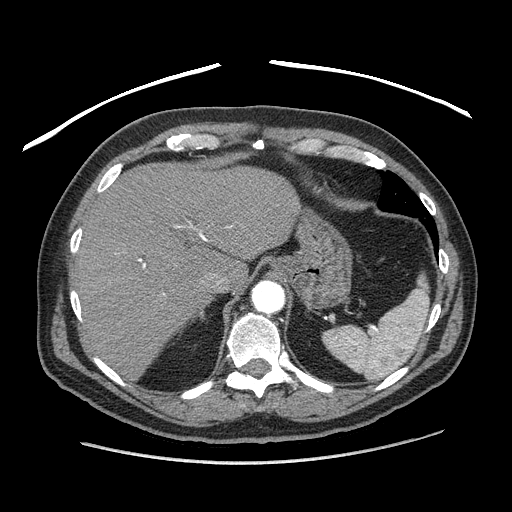
[im 121/182  lung]
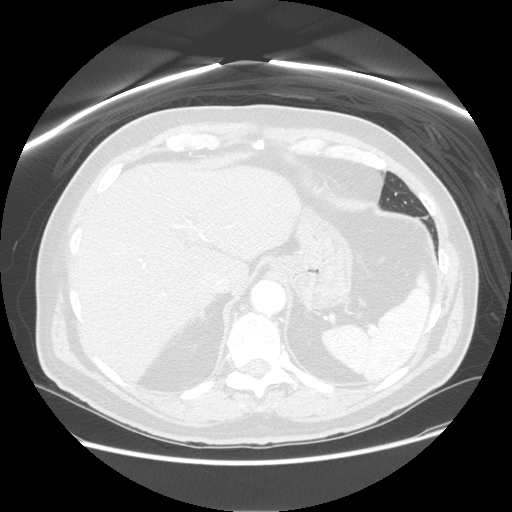
[im 151/182  soft-tissue]
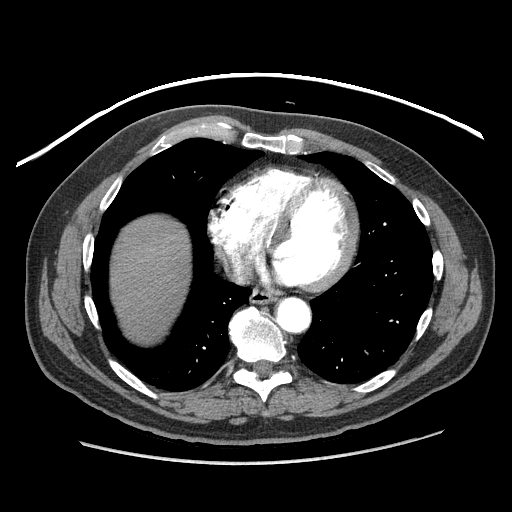
[im 151/182  lung]
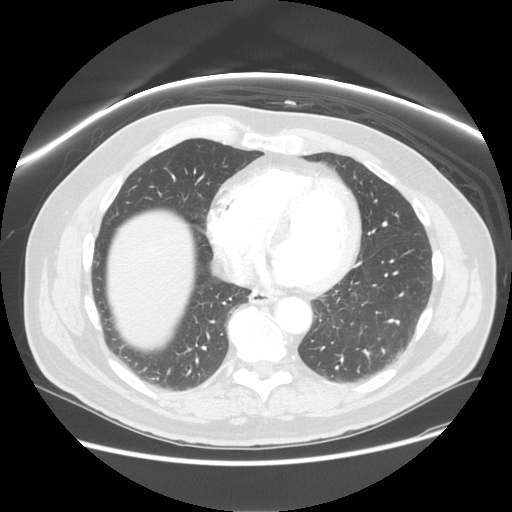

[Series 602: sagittal body · sagittal · 0.78mm/px · 4 of 161 slices shown (1 of 2)]
[im 33/161  soft-tissue]
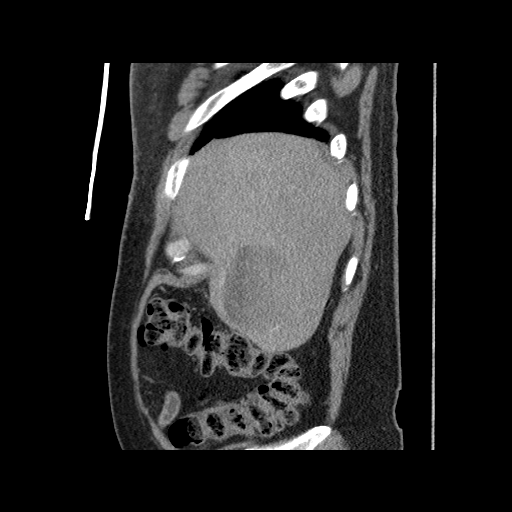
[im 65/161  soft-tissue]
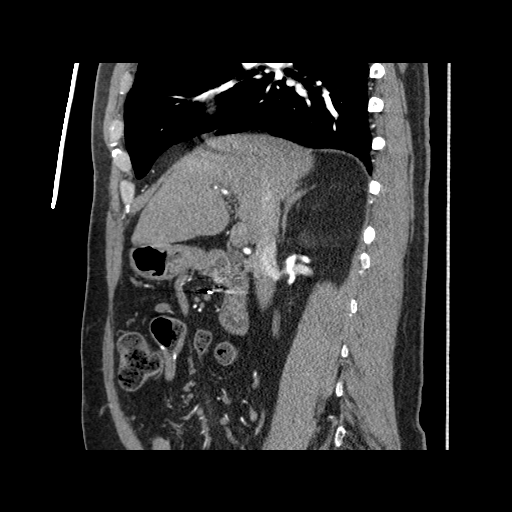
[im 97/161  soft-tissue]
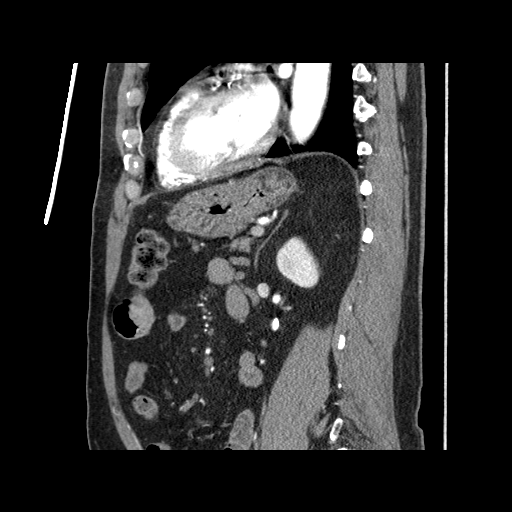
[im 129/161  soft-tissue]
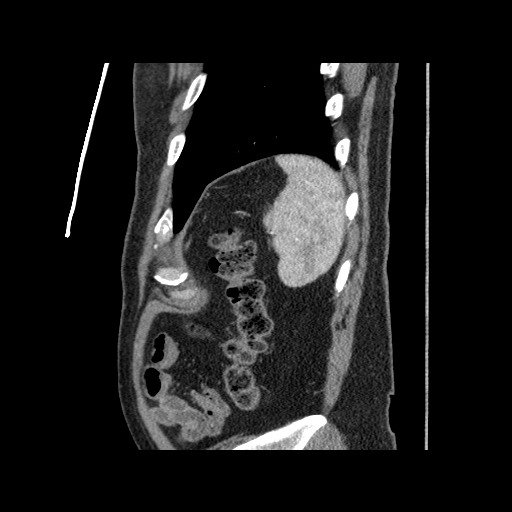

[Series 605: sagittal body · sagittal · 0.78mm/px · 4 of 160 slices shown (2 of 2)]
[im 32/160  soft-tissue]
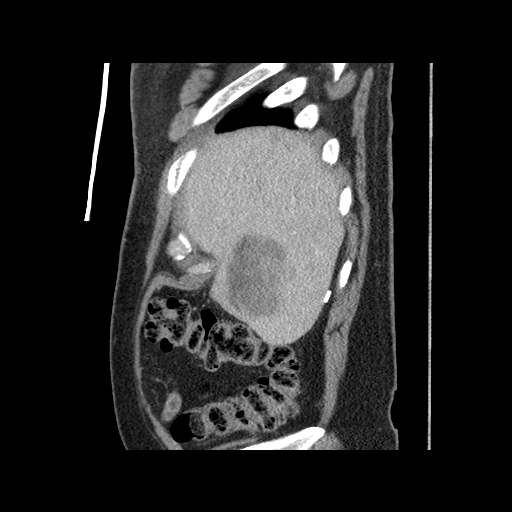
[im 64/160  soft-tissue]
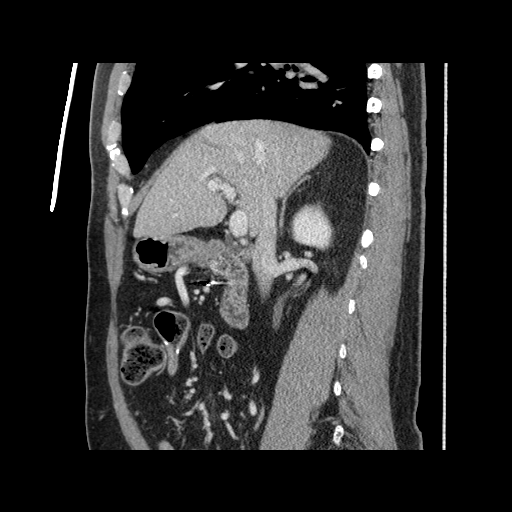
[im 96/160  soft-tissue]
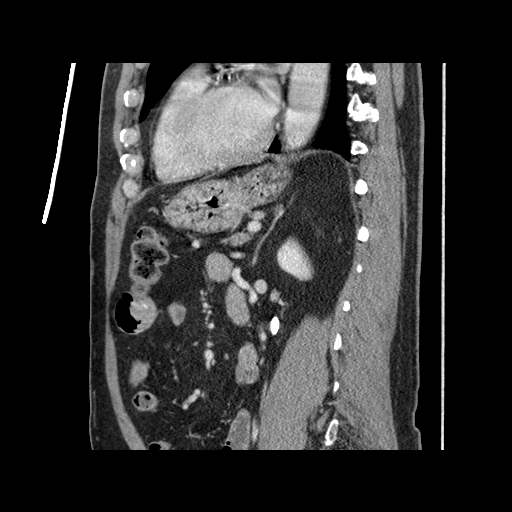
[im 128/160  soft-tissue]
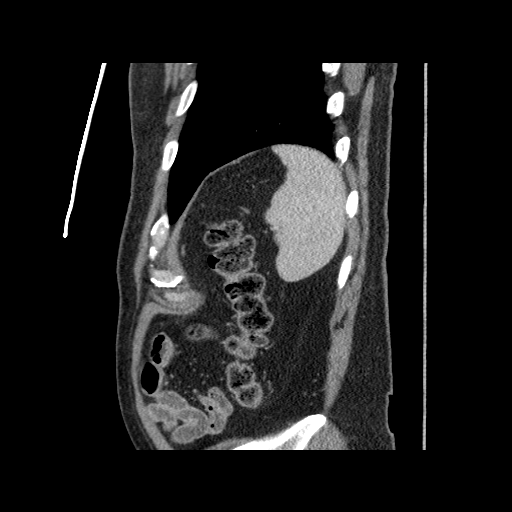

[13 of 36 positions shown; findings below may reference images not displayed]

FINDINGS: 5 mm calcified lingular nodule which is unchanged. Normal
heart size without pericardial or pleural effusion.  Multivessel
coronary artery atherosclerosis which is mildly age advanced.

A hepatic cyst is unchanged at 1.1 cm on image 34 of series 5.

The equivocal area of hypoattenuation within the lateral segment
left lobe is again identified on image 19 of series 5 and appears
similar.

Subcapsular lateral right liver lobe lesion measures 2.0 x 1.3 cm
on image 32 of series 5 versus 3.2 x 2.2 on the prior exam.

A more cephalad right liver lobe lesion is also decreased in size
and definition.  Measures 1.7 by 1.4 cm on image 26 image 24 of
series 5 versus 2.7 cm on the prior exam.

The dominant right liver lobe lesion measures 6.8 x 6.4 cm on image
36 versus 7.1 x 6.4 cm on the prior exam.  This suggests stability
versus minimal regression.

A subtle  area of hypoattenuation along the gallbladder fossa on
transverse image 30 its image 31 of series 5 and coronal image 39
of series 604 is equivocal.

A subtle hepatic dome left liver lobe lesion which measures 1.0 cm
on coronal image 42 of series 604 (also image 10 of series 5
transverse) is similar versus minimally enlarged from 9 mm on
coronal image 37 of series 604 on the [DATE] exam.

Patent portal and hepatic veins.  Normal spleen.  Underdistended
proximal stomach.  Coils of chemoembolization in the region of the
pancreatic head.

Normal adrenal glands and kidneys. No retroperitoneal or
retrocrural adenopathy.

Enterotomy changes.  Calcified mesenteric mass measures 3.0 x
cm on image 51 of series 5.  Slightly enlarged from 2.1 x 2.6 cm on
the prior exam.  Other surrounding mesenteric nodes are similar.
No bowel obstruction.  No ascites. No acute osseous abnormality.
IMPRESSION: 1.  Response to therapy of right-sided liver metastasis.
2.  Similar appearance of an equivocal area of hypoattenuation in
the lateral segment left liver lobe.
3.  Similar to minimal increase in size of a subtle hepatic dome
left-sided metastasis (not described on the most recent study).
4.  Slight enlargement of the calcified mesenteric mass consistent
with carcinoid metastasis.
5.  Multivessel coronary artery atherosclerosis.
6.  An area of hypoattenuation in the region of the gallbladder
fossa warrants followup attention.

## 2010-09-04 ENCOUNTER — Ambulatory Visit: Payer: Self-pay | Admitting: Hematology & Oncology

## 2010-09-14 ENCOUNTER — Ambulatory Visit: Payer: Self-pay | Admitting: Internal Medicine

## 2010-09-16 ENCOUNTER — Encounter: Payer: Self-pay | Admitting: Internal Medicine

## 2010-10-02 ENCOUNTER — Encounter: Payer: Self-pay | Admitting: Internal Medicine

## 2010-10-02 LAB — CBC WITH DIFFERENTIAL (CANCER CENTER ONLY)
EOS%: 5 % (ref 0.0–7.0)
Eosinophils Absolute: 0.3 10*3/uL (ref 0.0–0.5)
LYMPH%: 16.5 % (ref 14.0–48.0)
MCH: 28.1 pg (ref 28.0–33.4)
MCHC: 32.2 g/dL (ref 32.0–35.9)
MCV: 87 fL (ref 82–98)
MONO%: 5.5 % (ref 0.0–13.0)
Platelets: 205 10*3/uL (ref 145–400)
RDW: 11.9 % (ref 10.5–14.6)

## 2010-10-06 LAB — CHROMOGRANIN A

## 2010-11-13 ENCOUNTER — Ambulatory Visit (HOSPITAL_BASED_OUTPATIENT_CLINIC_OR_DEPARTMENT_OTHER): Payer: 59 | Admitting: Hematology & Oncology

## 2010-11-22 ENCOUNTER — Encounter: Payer: Self-pay | Admitting: Hematology & Oncology

## 2010-11-23 ENCOUNTER — Other Ambulatory Visit: Payer: Self-pay | Admitting: Interventional Radiology

## 2010-11-23 DIAGNOSIS — C787 Secondary malignant neoplasm of liver and intrahepatic bile duct: Secondary | ICD-10-CM

## 2010-12-01 ENCOUNTER — Ambulatory Visit (HOSPITAL_COMMUNITY)
Admission: RE | Admit: 2010-12-01 | Discharge: 2010-12-01 | Payer: Self-pay | Source: Home / Self Care | Attending: Interventional Radiology | Admitting: Interventional Radiology

## 2010-12-01 IMAGING — CT CT ABDOMEN WO/W CM
3 of 8 series · 13 of 46 positions shown, 19 images · IV contrast (agent unspecified)
Comparison: PET CT of [DATE]; [DATE]

CLINICAL DATA: Metastatic carcinoid.  Status post Y-90 treatment.
Small bowel resection.

CT ABDOMEN WITHOUT AND WITH CONTRAST
TECHNIQUE: Multidetector CT imaging of the abdomen was performed
following the standard protocol before and during bolus
administration of intravenous contrast.
Contrast: 125 ml [R5]

[Series 2: liver arterial · axial · arterial · 0.78mm/px · z∈[-268,-238]mm · 2 of 90 slices shown]
[im 10/90  soft-tissue]
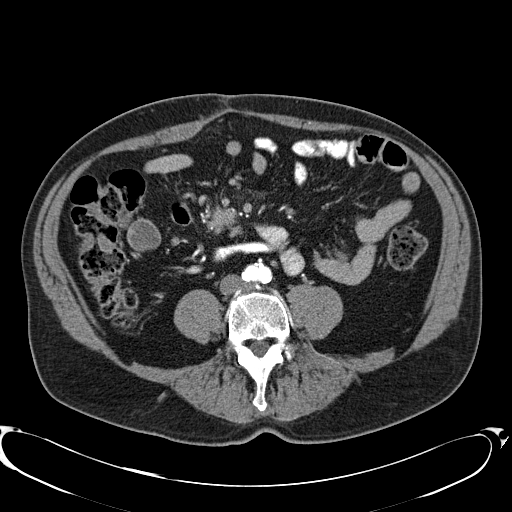
[im 20/90  soft-tissue]
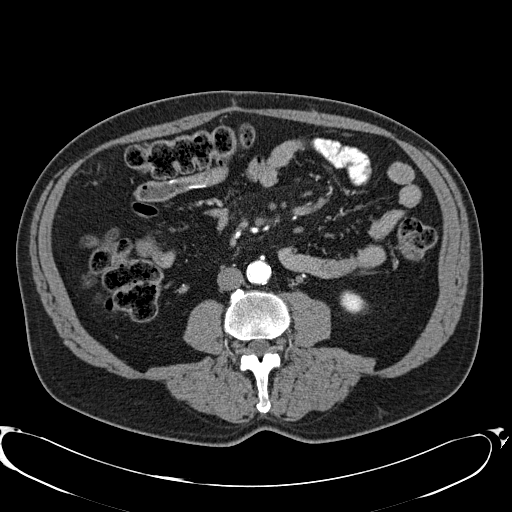

[Series 4: liver portal venous · axial · portal-venous · 0.78mm/px · z∈[-268,-58]mm · 8 of 90 slices shown, 13 images]
[im 10/90  soft-tissue]
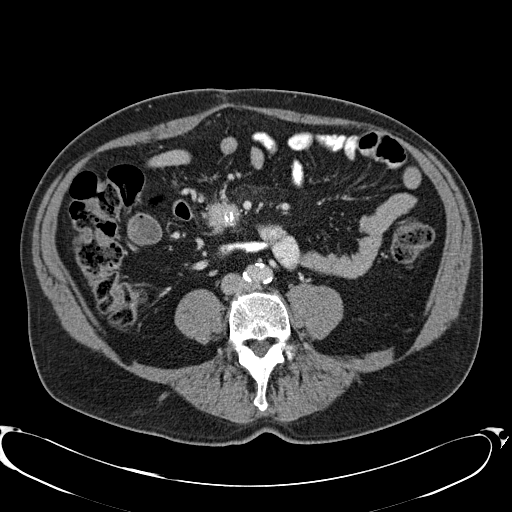
[im 10/90  bone]
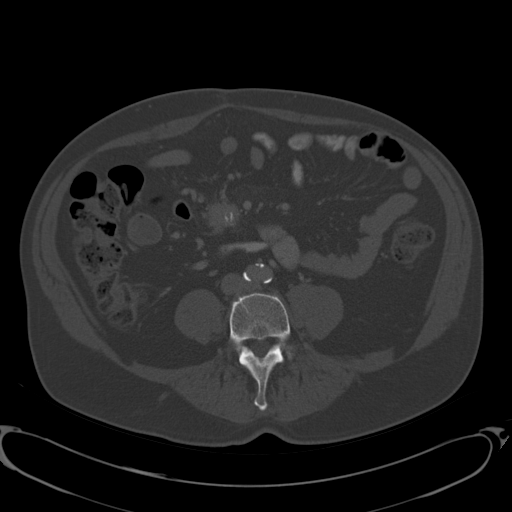
[im 20/90  soft-tissue]
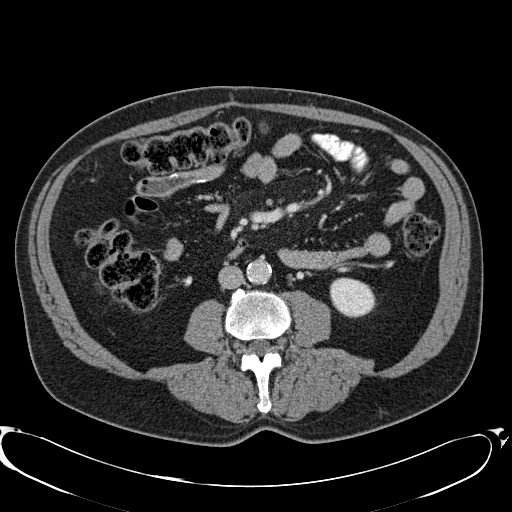
[im 30/90  soft-tissue]
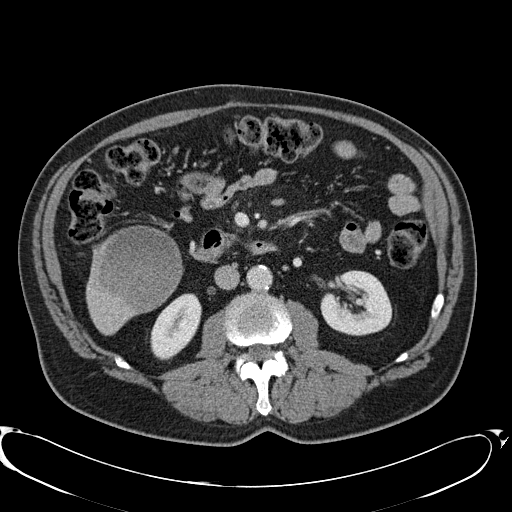
[im 40/90  soft-tissue]
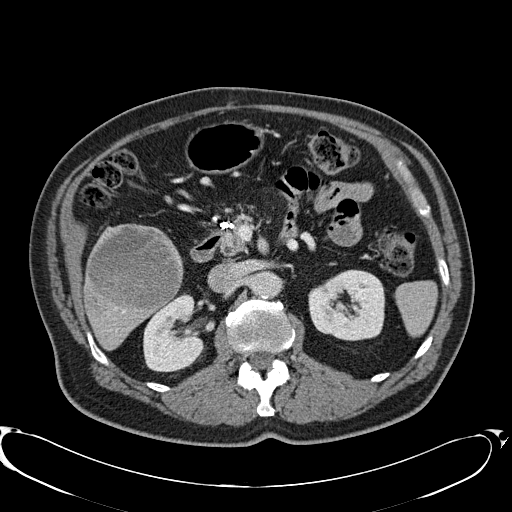
[im 50/90  soft-tissue]
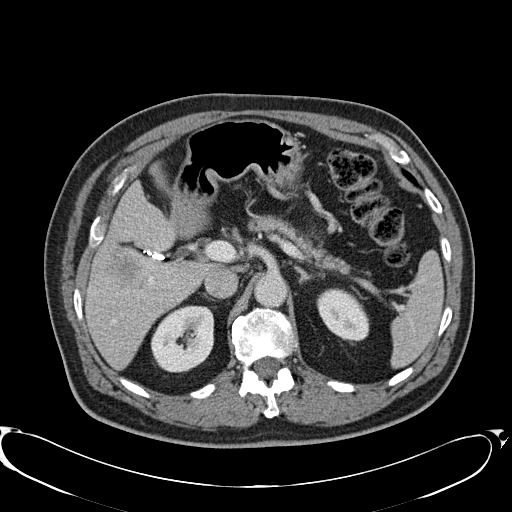
[im 50/90  lung]
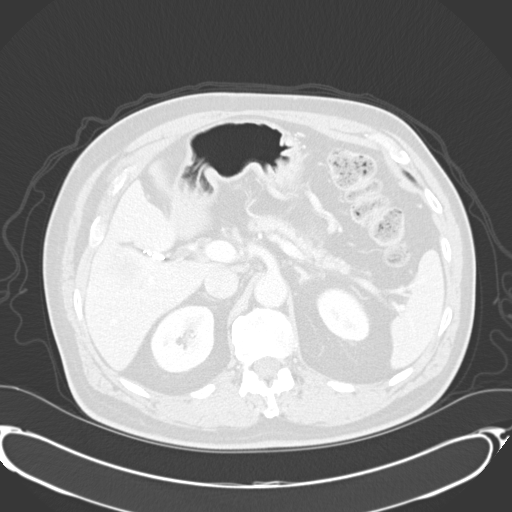
[im 60/90  soft-tissue]
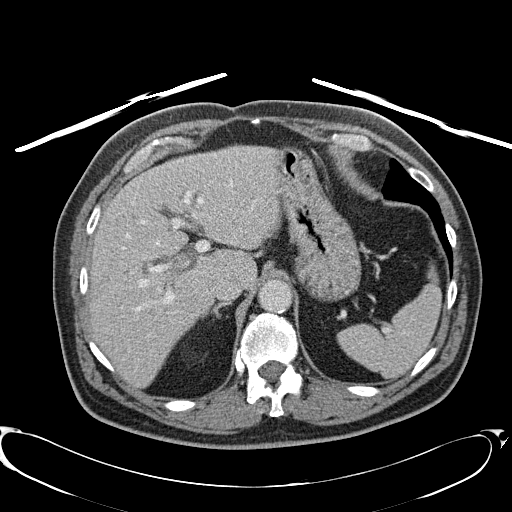
[im 60/90  lung]
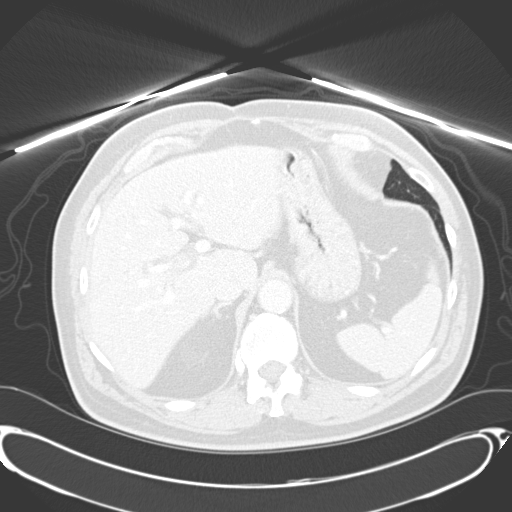
[im 70/90  soft-tissue]
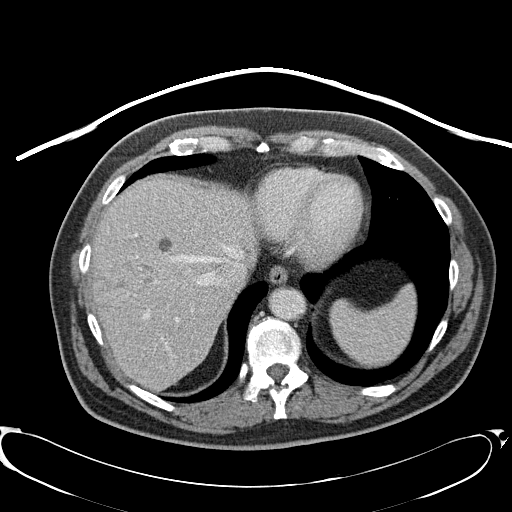
[im 70/90  lung]
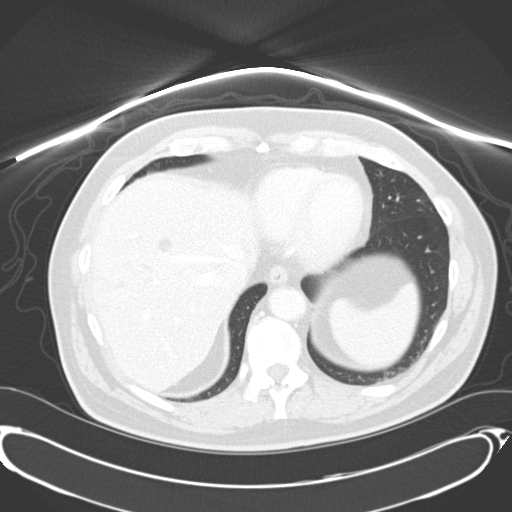
[im 80/90  soft-tissue]
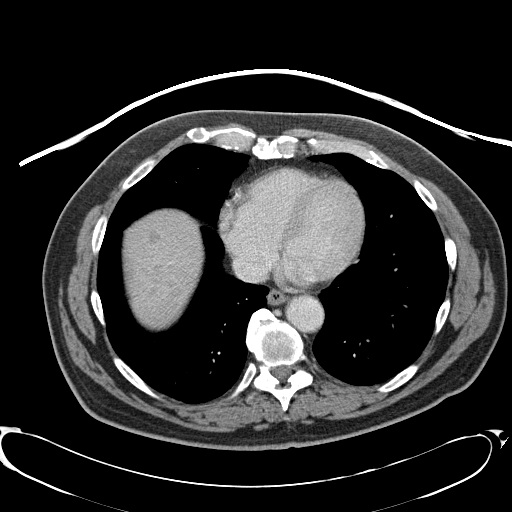
[im 80/90  lung]
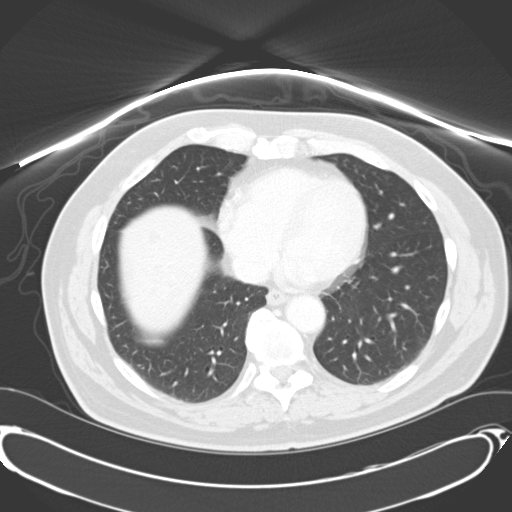

[Series 602: <mpr thick range> · coronal · 0.78mm/px · 3 of 127 slices shown, 4 images]
[im 32/127  soft-tissue]
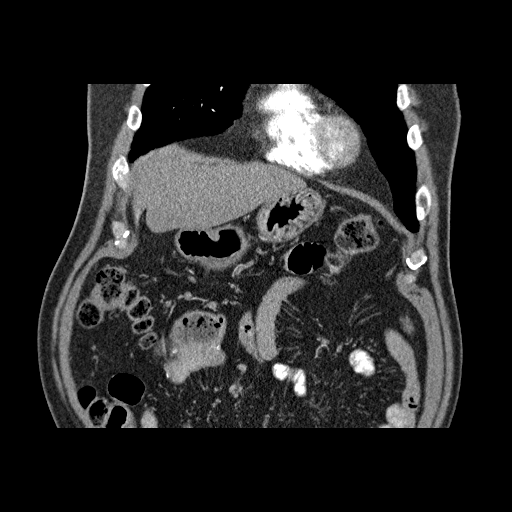
[im 64/127  soft-tissue]
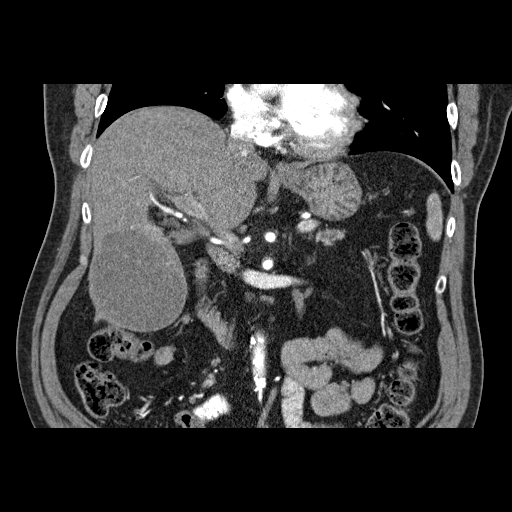
[im 64/127  bone]
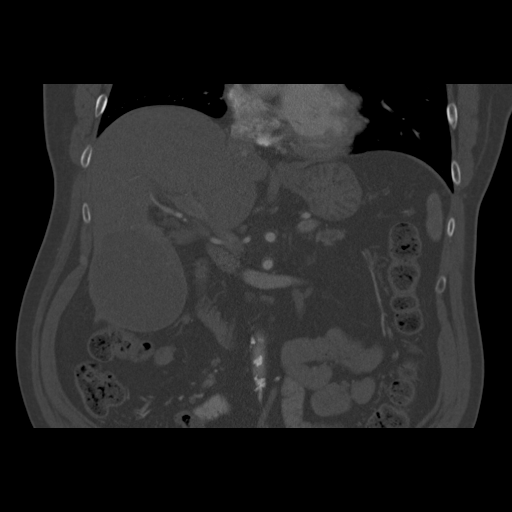
[im 95/127  soft-tissue]
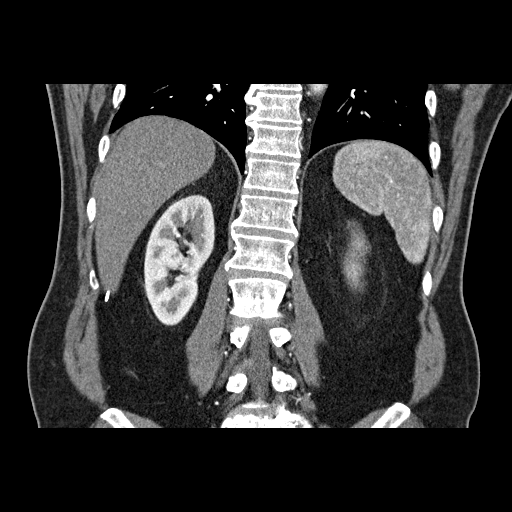

[13 of 46 positions shown; findings below may reference images not displayed]

FINDINGS: The dominant inferior right hepatic lobe lesion measures
7.5 cm in diameter on image 53 of series 2 (stable compared to CT
scan of [DATE]).  A faint focus of arterial phase enhancement
superiorly in the posterior segment right hepatic lobe measures
cm in diameter, stable, on image 20 of series 2.  On this same
image, a hypodense lesion further anteriorly measures 1.1 cm in
diameter, stable.

A peripheral arterial phase enhancing lesion in the right hepatic
lobe on image 33 of series 2 measures 1.8 cm in diameter
(retrospectively at 1.9 cm).  A lesion along the posterior
periphery of the dominant inferior right hepatic lobe lesion
measures 2.9 x 1.6 cm (formerly the same by my measurements).

Accordingly, no progression is identified.  The liver lesions in
general have similar activity to the surrounding hepatic parenchyma
on today's PET CT, although the dominant lesion inferiorly in the
right hepatic lobe is somewhat photopenic compared to hepatic
parenchyma on today's PET CT.

The spleen, adrenal glands, and kidneys appear unremarkable.  The
pancreas likewise appears stable.

Central mesenteric partially calcified nodule measures 2.0 x 2.5 cm
(formerly 3.1 x 2.4 cm).  No new masses identified.
IMPRESSION: 1.  Essentially stable appearance of hepatic and mesenteric lesions
compatible with treated carcinoid.  No progression or new lesion
identified.

## 2010-12-01 IMAGING — PT NM PET TUM IMG RESTAG (PS) SKULL BASE T - THIGH
5 series · 25 of 25 positions shown · non-contrast
Comparison: Multiple exams, including [DATE]

CLINICAL DATA: Carcinoid tumor.  Liver metastatic disease.  Status
post Y-90 treatment.

NUCLEAR MEDICINE PET/CT
TECHNIQUE: 16.8 mCi F-18 FDG was injected intravenously.  Full-
ring PET imaging was performed from the skull base through the mid-
thighs.  CT data was obtained and used for attenuation correction
and anatomic localization only.  (This was not acquired as a
diagnostic CT examination.)  Data regarding site of injection,
patient weight, post injection waiting period, and fasting blood
sugar is available in the PACS documentation.

[Series 1: pet ac · axial · 3.3mm · 4.69mm/px · z∈[-870,+0]mm · 5 of 267 slices shown]
[im 1/267]
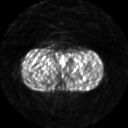
[im 67/267]
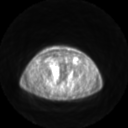
[im 134/267]
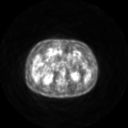
[im 200/267]
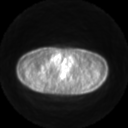
[im 267/267]
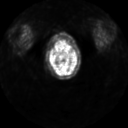

[Series 2: pet nac · axial · 3.3mm · 4.69mm/px · z∈[-870,+0]mm · 6 of 267 slices shown]
[im 1/267]
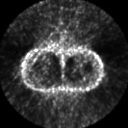
[im 54/267]
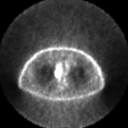
[im 107/267]
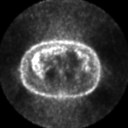
[im 160/267]
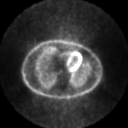
[im 213/267]
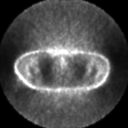
[im 267/267]
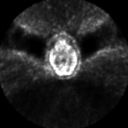

[Series 2: ct images · axial · 3.8mm · 0.98mm/px · z∈[-870,+0]mm · 6 of 265 slices shown]
[im 1/265]
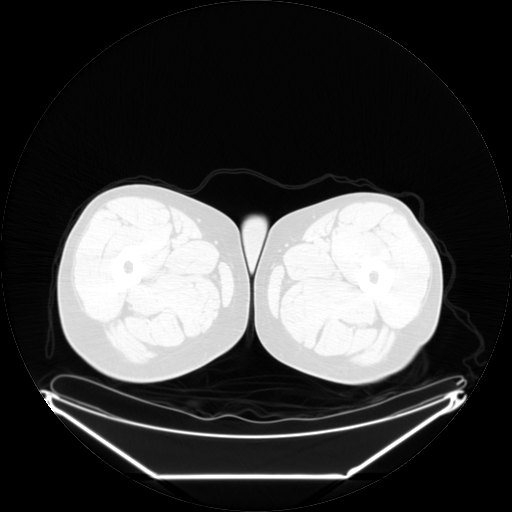
[im 53/265]
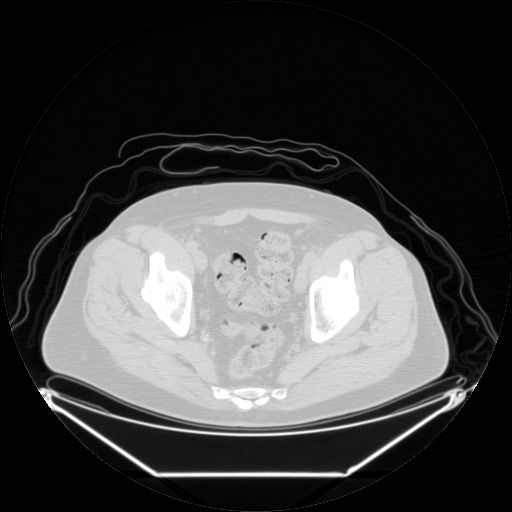
[im 106/265]
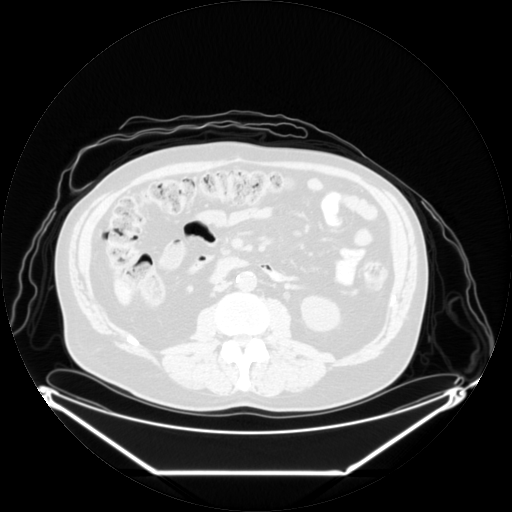
[im 159/265]
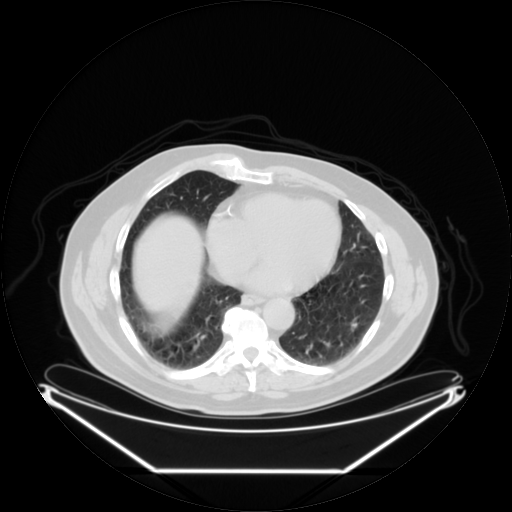
[im 212/265]
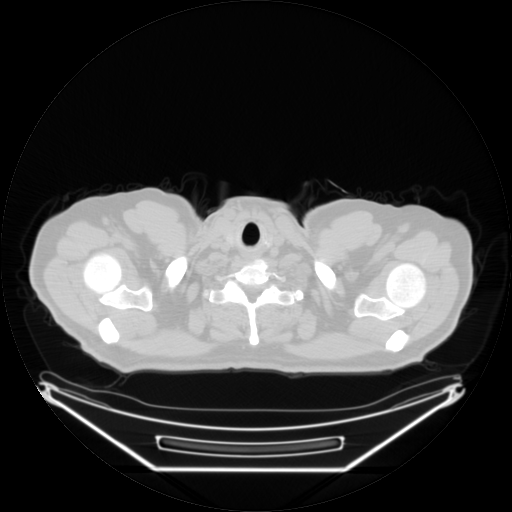
[im 265/265  brain]
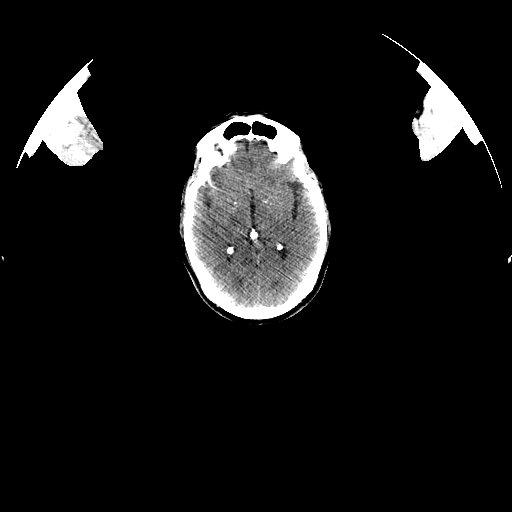

[Series 151: reformatted · axial · 3.3mm · 3.91mm/px · z∈[-870,+0]mm · 6 of 265 slices shown (1 of 2)]
[im 1/265]
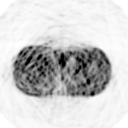
[im 53/265]
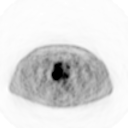
[im 106/265]
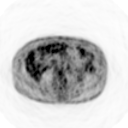
[im 159/265]
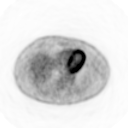
[im 212/265]
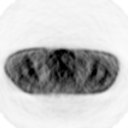
[im 265/265]
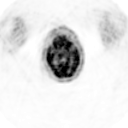

[Series 153: reformatted · coronal · 4.7mm · 6.98mm/px · 2 of 73 slices shown (2 of 2)]
[im 1/73]
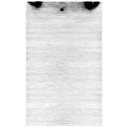
[im 73/73]
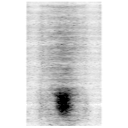

[25 of 25 positions shown; findings below may reference images not displayed]

FINDINGS: The patient has known lesions in the right hepatic lobe
but these do not appear to be hypermetabolic with respect to the
surrounding hepatic parenchyma on PET CT.  There is some contour
enlargement of the inferior margin of the right hepatic lobe,
associated with mildly photopenic lesion.

No findings of abnormal hypermetabolic activity in the neck or
chest.

There is activity along the collection of gas and fluid in the
subcutaneous tissues superficial to the right gluteus maximus,
likely inflammatory related to an injection.

Central calcified mesenteric mass measuring 2.4 cm in diameter does
not have significant associated hypermetabolic activity.

Coronary artery atherosclerosis noted.

There is opacification multiple ethmoid air cells along with
polypoid mucoperiosteal thickening in the right maxillary sinus.

A calcified nodule in the left upper lobe is compatible with
granuloma.
IMPRESSION: 1.  Photopenic and possibly necrotic lesion of the right hepatic
lobe inferiorly is partially exophytic, but is not associated with
increased activity.  Other faint lesions in the liver are not
associated with increased activity.
2.  Calcified mesenteric mass centrally in the mesentery is not
associated with increased activity.
3.  Coronary atherosclerosis.
4.  Paranasal sinusitis.

## 2010-12-01 NOTE — Progress Notes (Signed)
Summary: Pancrease alternative  Phone Note From Pharmacy   [id number] Summary of Call: Office received fax from pharmacy stating that Pancrease is not available, can Creon be used instead? Please advise. Initial call taken by: Lucious Groves,  April 14, 2010 9:14 AM  Follow-up for Phone Call        OK Follow-up by: Tresa Garter MD,  April 14, 2010 5:11 PM    New/Updated Medications: CREON 24000 UNIT CPEP (PANCRELIPASE (LIP-PROT-AMYL)) 1 by mouth qid w/meals Prescriptions: CREON 24000 UNIT CPEP (PANCRELIPASE (LIP-PROT-AMYL)) 1 by mouth qid w/meals  #120 x 12   Entered and Authorized by:   Tresa Garter MD   Signed by:   Lamar Sprinkles, CMA on 04/14/2010   Method used:   [facility] [phone][facility] (retail)       [address]       [place], [place]  [address]       Ph: [phone] or [phone]       Fax: [phone]   RxID:   [id number]

## 2010-12-01 NOTE — Letter (Signed)
Summary: [facility]   Imported By: Sherian Rein 02/17/2010 10:46:09  _____________________________________________________________________  External Attachment:    Type:   Image     Comment:   External Document

## 2010-12-01 NOTE — Letter (Signed)
Summary: [facility]  Internal Correspondence   Imported By: Esmeralda Links D'jimraou 02/14/2010 09:53:28  _____________________________________________________________________  External Attachment:    Type:   Image     Comment:   External Document

## 2010-12-01 NOTE — Procedures (Signed)
Summary: Colonoscopy  Patient: Sarah Zerby Note: All result statuses are Final unless otherwise noted.  Tests: (1) Colonoscopy (COL)   COL Colonoscopy           DONE     [facility]     [address]     [place], [place]  [address]           COLONOSCOPY PROCEDURE REPORT           PATIENT:  Devlon, Dosher  MR#:  [id number]     BIRTHDATE:  [birth year], 64 yrs. old  GENDER:  male     ENDOSCOPIST:  Wilhemina Bonito. Eda Keys, MD     REF. BY:  Surveillance Program Recall,     PROCEDURE DATE:  09/14/2010     PROCEDURE:  Colonoscopy with snare polypectomy x 1     ASA CLASS:  Class II     INDICATIONS:  history of polyps, surveillance and high-risk     screening ; 03-2005 w/ HPP     MEDICATIONS:   Fentanyl 100 mcg IV, Versed 10 mg IV           DESCRIPTION OF PROCEDURE:   After the risks benefits and     alternatives of the procedure were thoroughly explained, informed     consent was obtained.  Digital rectal exam was performed and     revealed no abnormalities.   The LB CF-[id number] endoscope     was introduced through the anus and advanced to the cecum, which     was identified by both the appendix and ileocecal valve, without     limitations.Time to cecum = 7:25 min.The quality of the prep was     excellent, using MoviPrep.  The instrument was then slowly     withdrawn (9:30 min) as the colon was fully examined.     <<PROCEDUREIMAGES>>           FINDINGS:  A diminutive polyp was found in the sigmoid colon.     Polyp was snared without cautery. Retrieval was successful.   Mild     diverticulosis was found in the sigmoid colon.  This was otherwise     a normal examination of the colon.  The terminal ileum appeared     normal.   Retroflexed views in the rectum revealed internal     hemorrhoids.    The scope was then withdrawn from the patient and     the procedure completed.           COMPLICATIONS:  None           ENDOSCOPIC IMPRESSION:     1) Diminutive polyp in the sigmoid  colon -removed     2) Mild diverticulosis in the sigmoid colon     3) Otherwise normal examination     4) Normal terminal ileum           RECOMMENDATIONS:     1) Repeat colonoscopy in 5 years if polyp adenomatous; otherwise     10 years     ______________________________     Wilhemina Bonito. Eda Keys, MD           CC:  Linda Hedges. Plotnikov, MDThe Patient           n.     eSIGNED:   John N. Eda Keys at 09/14/2010 02:03 PM           Viviann Spare, [id number]  Note: An exclamation mark [name])  indicates a result that was not dispersed into the flowsheet. Document Creation Date: 09/14/2010 2:04 PM _______________________________________________________________________  (1) Order result status: Final Collection or observation date-time: 09/14/2010 13:58 Requested date-time:  Receipt date-time:  Reported date-time:  Referring Physician:   Ordering Physician: Fransico Setters [phone]) Specimen Source:  Source: Launa Grill Order Number: [id number] Lab site:   Appended Document: Colonoscopy recall     Procedures Next Due Date:    Colonoscopy: 09/2015

## 2010-12-01 NOTE — Letter (Signed)
Summary: [facility] Instructions  [facility]  [address] [place], [place] [address]   Phone: [phone]  Fax: [phone]       Brian Mays    09-22-1946    MRN: [id number]        Procedure Day /Date: Monday 09-14-10     Arrival Time: 12:30 p.m.     Procedure Time: 1:30 p.m.     Location of Procedure:                      [facility] (4th Floor)   PREPARATION FOR COLONOSCOPY WITH MOVIPREP   Starting 5 days prior to your procedure  09-09-10 do not eat nuts, seeds, popcorn, corn, beans, peas,  salads, or any raw vegetables.  Do not take any fiber supplements (e.g. Metamucil, Citrucel, and Benefiber).  THE DAY BEFORE YOUR PROCEDURE         DATE:  09-13-10  DAY:  Sunday  1.  Drink clear liquids the entire day-NO SOLID FOOD  2.  Do not drink anything colored red or purple.  Avoid juices with pulp.  No orange juice.  3.  Drink at least 64 oz. (8 glasses) of fluid/clear liquids during the day to prevent dehydration and help the prep work efficiently.  CLEAR LIQUIDS INCLUDE: Water Jello Ice Popsicles Tea (sugar ok, no milk/cream) Powdered fruit flavored drinks Coffee (sugar ok, no milk/cream) Gatorade Juice: apple, white grape, white cranberry  Lemonade Clear bullion, consomm, broth Carbonated beverages (any kind) Strained chicken noodle soup Hard Candy                             4.  In the morning, mix first dose of MoviPrep solution:    Empty 1 Pouch A and 1 Pouch B into the disposable container    Add lukewarm drinking water to the top line of the container. Mix to dissolve    Refrigerate (mixed solution should be used within 24 hrs)  5.  Begin drinking the prep at 5:00 p.m. The MoviPrep container is divided by 4 marks.   Every 15 minutes drink the solution down to the next mark (approximately 8 oz) until the full liter is complete.   6.  Follow completed prep with 16 oz of clear liquid of your choice (Nothing red or purple).   Continue to drink clear liquids until bedtime.  7.  Before going to bed, mix second dose of MoviPrep solution:    Empty 1 Pouch A and 1 Pouch B into the disposable container    Add lukewarm drinking water to the top line of the container. Mix to dissolve    Refrigerate  THE DAY OF YOUR PROCEDURE      DATE:  09-14-10  DAY:  Monday  Beginning at  8:30 a.m. (5 hours before procedure):         1. Every 15 minutes, drink the solution down to the next mark (approx 8 oz) until the full liter is complete.  2. Follow completed prep with 16 oz. of clear liquid of your choice.    3. You may drink clear liquids until  11:30 a.m.  (2 HOURS BEFORE PROCEDURE).   MEDICATION INSTRUCTIONS  Unless otherwise instructed, you should take regular prescription medications with a small sip of water   as early as possible the morning of your procedure.         OTHER INSTRUCTIONS  You will need a responsible adult at least 64 years of age to accompany you and drive you home.   This person must remain in the waiting room during your procedure.  Wear loose fitting clothing that is easily removed.  Leave jewelry and other valuables at home.  However, you may wish to bring a book to read or  an iPod/MP3 player to listen to music as you wait for your procedure to start.  Remove all body piercing jewelry and leave at home.  Total time from sign-in until discharge is approximately 2-3 hours.  You should go home directly after your procedure and rest.  You can resume normal activities the  day after your procedure.  The day of your procedure you should not:   Drive   Make legal decisions   Operate machinery   Drink alcohol   Return to work  You will receive specific instructions about eating, activities and medications before you leave.    The above instructions have been reviewed and explained to me by   Ezra Sites RN  August 07, 2010 2:59 PM     I fully understand and can  verbalize these instructions _____________________________ Date _________

## 2010-12-01 NOTE — Miscellaneous (Signed)
Summary: [facility]  Clinical Lists Changes  Medications: Added new medication of MOVIPREP 100 GM  SOLR (PEG-KCL-NACL-NASULF-NA ASC-C) As per prep instructions. - Signed Rx of MOVIPREP 100 GM  SOLR (PEG-KCL-NACL-NASULF-NA ASC-C) As per prep instructions.;  #1 x 0;  Signed;  Entered by: Ezra Sites RN;  Authorized by: Hilarie Fredrickson MD;  Method used: [facility] [phone]*, [address] [place], [place]  [address], Ph: [phone] or [phone], Fax: [phone] Allergies: Removed allergy or adverse reaction of HYDROCHLOROTHIAZIDE Removed allergy or adverse reaction of VITAMIN D3 (CHOLECALCIFEROL)    Prescriptions: MOVIPREP 100 GM  SOLR (PEG-KCL-NACL-NASULF-NA ASC-C) As per prep instructions.  #1 x 0   Entered by:   Ezra Sites RN   Authorized by:   Hilarie Fredrickson MD   Signed by:   Ezra Sites RN on 08/07/2010   Method used:   [facility]ronically to        [facility] [phone]* (retail)       [address]       [place], [place]  [address]       Ph: [phone] or [phone]       Fax: [phone]   RxID:   [id number]

## 2010-12-01 NOTE — Progress Notes (Signed)
  Phone Note Refill Request Message from:  Patient on August 14, 2010 4:55 PM  Refills Requested: Medication #1:  CREON 24000 UNIT CPEP 1 by mouth three times daily w/meals Initial call taken by: Ami Bullins CMA,  August 14, 2010 4:55 PM    Prescriptions: CREON 24000 UNIT CPEP (PANCRELIPASE (LIP-PROT-AMYL)) 1 by mouth three times daily w/meals  #90 x 1   Entered by:   Ami Bullins CMA   Authorized by:   Tresa Garter MD   Signed by:   Bill Salinas CMA on 08/14/2010   Method used:   [facility]ronically to        [facility] [phone]* (retail)       [address]       [place], [place]  [address]       Ph: [phone] or [phone]       Fax: [phone]   RxID:   [id number]

## 2010-12-01 NOTE — Letter (Signed)
Summary: [facility]   Imported By: Lester Canfield 11/19/2009 07:20:47  _____________________________________________________________________  External Attachment:    Type:   Image     Comment:   External Document

## 2010-12-01 NOTE — Progress Notes (Signed)
Summary: med change request   Phone Note Call from Patient Call back at [phone] (cell)   Caller: Patient Call For: Dr. Marina Goodell Reason for Call: Talk to Nurse Summary of Call: Creon is too explensive for pt and he would like something more affordable... please call pt to discuss Initial call taken by: Vallarie Mare,  August 07, 2010 3:10 PM  Follow-up for Phone Call        called pharmacy and all pancreatic meds are around the same cost as Creon.  Pt. aware.   Follow-up by: Milford Cage NCMA,  August 13, 2010 8:51 AM

## 2010-12-01 NOTE — Progress Notes (Signed)
Summary: [facility]Creon  Phone Note From Pharmacy   [facility]  [address] [phone]* Summary of Call: Pt has been getting Pancrease MT-20 capsules 1 tid .  Per pharm they can no longer get this medication.  Pt is almost out...can we change to something else? Initial call taken by: Lanier Prude, [facility]),  May 13, 2010 3:16 PM  Follow-up for Phone Call        change to creon 24 - 1 by mouth three times a day with meals - (or whatever other enzyme substitute the pharmacy has in stock would be ok ) - thanks Follow-up by: Newt Lukes MD,  May 13, 2010 4:44 PM    New/Updated Medications: CREON 24000 UNIT CPEP (PANCRELIPASE (LIP-PROT-AMYL)) 1 by mouth three times daily w/meals Prescriptions: CREON 24000 UNIT CPEP (PANCRELIPASE (LIP-PROT-AMYL)) 1 by mouth three times daily w/meals  #90 x 1   Entered by:   Lanier Prude, CMA(AAMA)   Authorized by:   Tresa Garter MD   Signed by:   Lanier Prude, CMA(AAMA) on 05/14/2010   Method used:   [facility]ronically to        [facility] [phone]* (retail)       [address]       [place], [place]  [address]       Ph: [phone] or [phone]       Fax: [phone]   RxID:   [id number]

## 2010-12-01 NOTE — Letter (Signed)
Summary: [facility]  [facility]   Imported By: Sherian Rein 08/31/2010 11:52:10  _____________________________________________________________________  External Attachment:    Type:   Image     Comment:   External Document

## 2010-12-01 NOTE — Letter (Signed)
[facility]   Imported By: Sherian Rein 12/25/2009 08:27:17  _____________________________________________________________________  External Attachment:    Type:   Image     Comment:   External Document

## 2010-12-01 NOTE — Letter (Signed)
Summary: Colonoscopy Letter  [facility]  [address] [place], [place] [address]   Phone: [phone]  Fax: [phone]      Mar 19, 2010 MRN: [id number]   Brian Mays [address] [place], [place]  [address]   Dear [name],   According to your medical record, it is time for you to schedule a Colonoscopy. The American Cancer Society recommends this procedure as a method to detect early colon cancer. Patients with a family history of colon cancer, or a personal history of colon polyps or inflammatory bowel disease are at increased risk.  This letter has been generated based on the recommendations made at the time of your procedure. If you feel that in your particular situation this may no longer apply, please contact our office.  Please call our office at [phone] to schedule this appointment or to update your records at your earliest convenience.  Thank you for cooperating with [place] to provide you with the very best care possible.   Sincerely,  Wilhemina Bonito. Marina Goodell, M.D.  [facility] Gastroenterology Division [phone]

## 2010-12-01 NOTE — Letter (Signed)
[facility]   Imported By: Sherian Rein 05/08/2010 13:26:31  _____________________________________________________________________  External Attachment:    Type:   Image     Comment:   External Document

## 2010-12-01 NOTE — Letter (Signed)
[facility]   Imported By: Sherian Rein 06/24/2010 13:34:40  _____________________________________________________________________  External Attachment:    Type:   Image     Comment:   External Document

## 2010-12-01 NOTE — Letter (Signed)
[facility]   Imported By: Sherian Rein 03/26/2010 10:18:24  _____________________________________________________________________  External Attachment:    Type:   Image     Comment:   External Document

## 2010-12-01 NOTE — Letter (Signed)
Summary: Pre Visit Letter Revised  [facility]  [address]   [place], [place] [address]   Phone: [phone]  Fax: [phone]        08/03/2010 MRN: [id number] Brian Mays [address] [place], [place]  [address]             Procedure Date:  09/14/10  Welcome to the Gastroenterology Division at [facility].    You are scheduled to see a nurse for your pre-procedure visit on August 07, 2010 at 2:30pm on the 3rd floor at [facility], [address]  We ask that you try to arrive at our office 15 minutes prior to your appointment time to allow for check-in.  Please take a minute to review the attached form.  If you answer "Yes" to one or more of the questions on the first page, we ask that you call the person listed at your earliest opportunity.  If you answer "No" to all of the questions, please complete the rest of the form and bring it to your appointment.    Your nurse visit will consist of discussing your medical and surgical history, your immediate family medical history, and your medications.   If you are unable to list all of your medications on the form, please bring the medication bottles to your appointment and we will list them.  We will need to be aware of both prescribed and over the counter drugs.  We will need to know exact dosage information as well.    Please be prepared to read and sign documents such as consent forms, a financial agreement, and acknowledgement forms.  If necessary, and with your consent, a friend or relative is welcome to sit-in on the nurse visit with you.  Please bring your insurance card so that we may make a copy of it.  If your insurance requires a referral to see a specialist, please bring your referral form from your primary care physician.  No co-pay is required for this nurse visit.     If you cannot keep your appointment, please call [phone] to cancel or reschedule prior to your appointment date.   This allows us the opportunity to schedule an appointment for another patient in need of care.    Thank you for choosing [facility] for your medical needs.  We appreciate the opportunity to care for you.  Please visit us at our website  to learn more about our practice.  Sincerely, The Gastroenterology Division         

## 2010-12-01 NOTE — Letter (Signed)
Summary: Patient Notice- Polyp Results  [facility]  [address] [place], [place] [address]   Phone: [phone]  Fax: [phone]        September 16, 2010 MRN: [id number]    ONAJE WARNE [address] [place], [place]  [address]    Dear Mr. Kozak,  I am pleased to inform you that the colon polyp(s) removed during your recent colonoscopy was (were) found to be benign (no cancer detected) upon pathologic examination.  I recommend you have a repeat colonoscopy examination in 5 years to look for recurrent polyps, as having colon polyps increases your risk for having recurrent polyps or even colon cancer in the future.  Should you develop new or worsening symptoms of abdominal pain, bowel habit changes or bleeding from the rectum or bowels, please schedule an evaluation with either your primary care physician or with me.    Additional information/recommendations:  __ No further action with gastroenterology is needed at this time. Please      follow-up with your primary care physician for your other healthcare      needs.    Please call us if you are having persistent problems or have questions about your condition that have not been fully answered at this time.  Sincerely,  Hilarie Fredrickson MD  This letter has been electronically signed by your physician.  Appended Document: Patient Notice- Polyp Results Letter mailed

## 2010-12-01 NOTE — Letter (Signed)
Summary: Pre Visit Letter Revised  [facility]  [address] [place], [place] [address]   Phone: [phone]  Fax: [phone]        08/03/2010 MRN: [id number] Brian Mays [address] [place], [place]  [address]             Procedure Date:  09/14/10  Welcome to the Gastroenterology Division at [facility]    You are scheduled to see a nurse for your pre-procedure visit on August 07, 2010 at 2:30pm on the 3rd floor at [facility], [address]  We ask that you try to arrive at our office 15 minutes prior to your appointment time to allow for check-in.  Please take a minute to review the attached form.  If you answer "Yes" to one or more of the questions on the first page, we ask that you call the person listed at your earliest opportunity.  If you answer "No" to all of the questions, please complete the rest of the form and bring it to your appointment.    Your nurse visit will consist of discussing your medical and surgical history, your immediate family medical history, and your medications.   If you are unable to list all of your medications on the form, please bring the medication bottles to your appointment and we will list them.  We will need to be aware of both prescribed and over the counter drugs.  We will need to know exact dosage information as well.    Please be prepared to read and sign documents such as consent forms, a financial agreement, and acknowledgement forms.  If necessary, and with your consent, a friend or relative is welcome to sit-in on the nurse visit with you.  Please bring your insurance card so that we may make a copy of it.  If your insurance requires a referral to see a specialist, please bring your referral form from your primary care physician.  No co-pay is required for this nurse visit.     If you cannot keep your appointment, please call [phone] to cancel or reschedule prior to your appointment date.   This allows [place] the opportunity to schedule an appointment for another patient in need of care.    Thank you for choosing [facility] for your medical needs.  We appreciate the opportunity to care for you.  Please visit [place] at our website  to learn more about our practice.  Sincerely, The Gastroenterology Division

## 2010-12-02 ENCOUNTER — Telehealth (INDEPENDENT_AMBULATORY_CARE_PROVIDER_SITE_OTHER): Payer: Self-pay | Admitting: *Deleted

## 2010-12-03 NOTE — Letter (Signed)
Summary: [facility]  [facility]   Imported By: [place] 10/14/2010 07:45:57  _____________________________________________________________________  External Attachment:    Type:   Image     Comment:   External Document

## 2010-12-04 ENCOUNTER — Encounter: Payer: Self-pay | Admitting: Internal Medicine

## 2010-12-04 ENCOUNTER — Encounter (HOSPITAL_BASED_OUTPATIENT_CLINIC_OR_DEPARTMENT_OTHER): Payer: 59 | Admitting: Hematology & Oncology

## 2010-12-04 DIAGNOSIS — C7A00 Malignant carcinoid tumor of unspecified site: Secondary | ICD-10-CM

## 2010-12-04 DIAGNOSIS — Z23 Encounter for immunization: Secondary | ICD-10-CM

## 2010-12-04 DIAGNOSIS — C787 Secondary malignant neoplasm of liver and intrahepatic bile duct: Secondary | ICD-10-CM

## 2010-12-04 LAB — CBC WITH DIFFERENTIAL (CANCER CENTER ONLY)
BASO#: 0 10*3/uL (ref 0.0–0.2)
Eosinophils Absolute: 0.4 10*3/uL (ref 0.0–0.5)
HGB: 13.4 g/dL (ref 13.0–17.1)
MCH: 28.6 pg (ref 28.0–33.4)
MCV: 87 fL (ref 82–98)
MONO#: 0.4 10*3/uL (ref 0.1–0.9)
NEUT#: 4.3 10*3/uL (ref 1.5–6.5)
Platelets: 175 10*3/uL (ref 145–400)
RBC: 4.7 10*6/uL (ref 4.20–5.70)
WBC: 6.2 10*3/uL (ref 4.0–10.0)

## 2010-12-09 ENCOUNTER — Encounter: Payer: Self-pay | Admitting: Internal Medicine

## 2010-12-09 ENCOUNTER — Ambulatory Visit
Admission: RE | Admit: 2010-12-09 | Discharge: 2010-12-09 | Disposition: A | Discharge status: Home / Self Care | Payer: Commercial Managed Care - PPO | Source: Ambulatory Visit | Attending: Interventional Radiology | Admitting: Interventional Radiology

## 2010-12-09 DIAGNOSIS — C787 Secondary malignant neoplasm of liver and intrahepatic bile duct: Secondary | ICD-10-CM

## 2010-12-09 LAB — COMPREHENSIVE METABOLIC PANEL
Albumin: 4 g/dL (ref 3.5–5.2)
BUN: 14 mg/dL (ref 6–23)
CO2: 25 mEq/L (ref 19–32)
Calcium: 9.1 mg/dL (ref 8.4–10.5)
Glucose, Bld: 86 mg/dL (ref 70–99)
Potassium: 4 mEq/L (ref 3.5–5.3)
Sodium: 141 mEq/L (ref 135–145)
Total Protein: 6.9 g/dL (ref 6.0–8.3)

## 2010-12-09 LAB — CHROMOGRANIN A: Chromogranin A: 5.2 ng/mL (ref 1.9–15.0)

## 2010-12-09 NOTE — Progress Notes (Signed)
Summary: Radiology test orders clarification   Phone Note Outgoing Call   Summary of Call: Phone call made to radiology re.petscan and c.t. done yesterday per request of Dr.Perry as he is listed as ordering physician. and did not order tests.Fayrene Fearing said Dr.Yamagati's name is on as ordering dr. but he would have had to get permisiion from some Dr.Dr.Ennever's office contacted and he did not order tests either but pt's. last office note from him indicates that I.R wanted repeat c.t. in Jan.2012. and there is no mention of pet scan.Fayrene Fearing is going to talk with Dr.Yamagati and call back. Initial call taken by: Teryl Lucy RN,  December 02, 2010 11:43 AM  Follow-up for Phone Call        okay. Make sure copies of these reports get to Dr. Gustavo Lah office. Follow-up by: Hilarie Fredrickson MD,  December 02, 2010 11:46 AM  Additional Follow-up for Phone Call Additional follow up Details #1::        reports faxed to Dr.Ennever. Teryl Lucy RN  December 02, 2010 12:31 PM Call rec'd. from [place] and Dr.Yamagati did order testing and he wishes to apologize to Dr.Perry for the error and correct Dr. will be changed in the system. Additional Follow-up by: Teryl Lucy RN,  December 04, 2010 2:59 PM    Additional Follow-up for Phone Call Additional follow up Details #2::    ok Follow-up by: Hilarie Fredrickson MD,  December 04, 2010 3:12 PM

## 2011-01-07 NOTE — Letter (Addendum)
Summary: [facility]  [facility]   Imported By: Lennie Odor 12/29/2010 11:28:01  _____________________________________________________________________  External Attachment:    Type:   Image     Comment:   External Document

## 2011-01-16 LAB — APTT: aPTT: 29 seconds (ref 24–37)

## 2011-01-16 LAB — CBC
Hemoglobin: 14.4 g/dL (ref 13.0–17.0)
MCH: 29.6 pg (ref 26.0–34.0)
MCHC: 34.9 g/dL (ref 30.0–36.0)

## 2011-01-16 LAB — COMPREHENSIVE METABOLIC PANEL
AST: 36 U/L (ref 0–37)
CO2: 29 mEq/L (ref 19–32)
Calcium: 9.3 mg/dL (ref 8.4–10.5)
Creatinine, Ser: 0.83 mg/dL (ref 0.4–1.5)
GFR calc Af Amer: >60 mL/min (ref >60)
GFR calc non Af Amer: >60 mL/min (ref >60)
Glucose, Bld: 147 mg/dL — ABNORMAL HIGH (ref 70–99)

## 2011-01-16 LAB — PROTIME-INR: Prothrombin Time: 13.1 seconds (ref 11.6–15.2)

## 2011-01-17 LAB — CHROMOGRANIN A: Chromogranin A: 7.4 ng/mL (ref 1.9–15.0)

## 2011-01-17 LAB — CBC
Platelets: 188 10*3/uL (ref 150–400)
RDW: 14.1 % (ref 11.5–15.5)
WBC: 9 10*3/uL (ref 4.0–10.5)

## 2011-01-17 LAB — COMPREHENSIVE METABOLIC PANEL
ALT: 22 U/L (ref 0–53)
AST: 25 U/L (ref 0–37)
Calcium: 9.3 mg/dL (ref 8.4–10.5)
GFR calc Af Amer: >60 mL/min (ref >60)
Sodium: 142 mEq/L (ref 135–145)
Total Protein: 7.6 g/dL (ref 6.0–8.3)

## 2011-02-05 ENCOUNTER — Encounter (HOSPITAL_BASED_OUTPATIENT_CLINIC_OR_DEPARTMENT_OTHER): Payer: 59 | Admitting: Hematology & Oncology

## 2011-02-05 ENCOUNTER — Other Ambulatory Visit: Payer: Self-pay | Admitting: Hematology & Oncology

## 2011-02-05 DIAGNOSIS — C787 Secondary malignant neoplasm of liver and intrahepatic bile duct: Secondary | ICD-10-CM

## 2011-02-05 DIAGNOSIS — C7A00 Malignant carcinoid tumor of unspecified site: Secondary | ICD-10-CM

## 2011-02-05 DIAGNOSIS — Z23 Encounter for immunization: Secondary | ICD-10-CM

## 2011-02-05 LAB — CBC WITH DIFFERENTIAL (CANCER CENTER ONLY)
BASO#: 0 10e3/uL (ref 0.0–0.2)
BASO%: 0.5 % (ref 0.0–2.0)
EOS%: 3.7 % (ref 0.0–7.0)
Eosinophils Absolute: 0.2 10e3/uL (ref 0.0–0.5)
HCT: 42 % (ref 38.7–49.9)
HGB: 14.3 g/dL (ref 13.0–17.1)
LYMPH#: 0.8 10e3/uL — ABNORMAL LOW (ref 0.9–3.3)
LYMPH%: 12.3 % — ABNORMAL LOW (ref 14.0–48.0)
MCH: 28.9 pg (ref 28.0–33.4)
MCHC: 34 g/dL (ref 32.0–35.9)
MCV: 85 fL (ref 82–98)
MONO#: 0.4 10e3/uL (ref 0.1–0.9)
MONO%: 6.2 % (ref 0.0–13.0)
NEUT#: 4.9 10e3/uL (ref 1.5–6.5)
NEUT%: 77.3 % (ref 40.0–80.0)
Platelets: 188 10e3/uL (ref 145–400)
RBC: 4.94 10e6/uL (ref 4.20–5.70)
RDW: 13.5 % (ref 11.1–15.7)
WBC: 6.3 10e3/uL (ref 4.0–10.0)

## 2011-02-09 LAB — COMPREHENSIVE METABOLIC PANEL
ALT: 16 U/L (ref 0–53)
Albumin: 4.3 g/dL (ref 3.5–5.2)
CO2: 25 mEq/L (ref 19–32)
Glucose, Bld: 126 mg/dL — ABNORMAL HIGH (ref 70–99)
Potassium: 3.9 mEq/L (ref 3.5–5.3)
Sodium: 139 mEq/L (ref 135–145)
Total Protein: 7.5 g/dL (ref 6.0–8.3)

## 2011-02-09 LAB — CHROMOGRANIN A: Chromogranin A: 50 ng/mL — ABNORMAL HIGH (ref 1.9–15.0)

## 2011-02-15 LAB — PROTIME-INR: INR: 0.9 (ref 0.00–1.49)

## 2011-02-15 LAB — APTT: aPTT: 25 seconds (ref 24–37)

## 2011-02-15 LAB — CBC
HCT: 43 % (ref 39.0–52.0)
MCHC: 33.8 g/dL (ref 30.0–36.0)
Platelets: 202 10*3/uL (ref 150–400)
RDW: 13.8 % (ref 11.5–15.5)

## 2011-02-27 ENCOUNTER — Other Ambulatory Visit: Payer: Self-pay | Admitting: *Deleted

## 2011-02-27 MED ORDER — PANCRELIPASE (LIP-PROT-AMYL) 24000-76000 UNITS PO CPEP: 1.0000 | ORAL_CAPSULE | Freq: Three times a day (TID) | ORAL | Status: DC

## 2011-03-03 ENCOUNTER — Other Ambulatory Visit: Payer: Self-pay | Admitting: Internal Medicine

## 2011-03-04 ENCOUNTER — Other Ambulatory Visit: Payer: Self-pay | Admitting: *Deleted

## 2011-03-04 MED ORDER — PANCRELIPASE (LIP-PROT-AMYL) 24000-76000 UNITS PO CPEP: 1.0000 | ORAL_CAPSULE | Freq: Three times a day (TID) | ORAL | Status: DC

## 2011-03-05 ENCOUNTER — Encounter (HOSPITAL_BASED_OUTPATIENT_CLINIC_OR_DEPARTMENT_OTHER): Payer: 59 | Admitting: Hematology & Oncology

## 2011-03-05 ENCOUNTER — Other Ambulatory Visit: Payer: Self-pay | Admitting: Hematology & Oncology

## 2011-03-05 DIAGNOSIS — C787 Secondary malignant neoplasm of liver and intrahepatic bile duct: Secondary | ICD-10-CM

## 2011-03-05 DIAGNOSIS — Z23 Encounter for immunization: Secondary | ICD-10-CM

## 2011-03-05 DIAGNOSIS — C7A00 Malignant carcinoid tumor of unspecified site: Secondary | ICD-10-CM

## 2011-03-09 LAB — CHROMOGRANIN A: Chromogranin A: 6 ng/mL (ref 1.9–15.0)

## 2011-03-16 NOTE — Assessment & Plan Note (Signed)
[facility], [place]                           PRIMARY CARE OFFICE NOTE   NAME:[name], Brian Mays                       MRN:          [id number]  DATE:04/24/2007                            DOB:          25-Jun-1946    PROCEDURE:  Skin biopsy.   INDICATIONS FOR PROCEDURE:  Mole with irregular color and surface, rule  out melanoma.   LOCATION:  Left flank, posteriorly above the buttocks.   DESCRIPTION OF PROCEDURE:  Risks including complete procedure, bleeding,  infection, and benefits explained to the patient in detail and he agreed  to proceed. He was placed in decubitus position. The area was scrubbed  with Betadine and alcohol and injected with 0.5 cc of 2% Lidocaine with  epinephrine. The specimen was sent to the lab. The wound treated with  hyfercator .  Dressed with bandaid.   COMPLICATIONS:  None.     Georgina Quint. Plotnikov, MD  Electronically Signed    AVP/MedQ  DD: 04/25/2007  DT: 04/25/2007  Job #: [id number]

## 2011-03-19 NOTE — Assessment & Plan Note (Signed)
[facility]                           GASTROENTEROLOGY OFFICE NOTE   NAME:Brian Mays, Brian Mays                       MRN:          [id number]  DATE:08/04/2006                            DOB:          Nov 22, 1945    REASON FOR CONSULTATION:  Dyspepsia, belching, question reflux.   HISTORY OF PRESENT ILLNESS:  This is a 65 year old white male with a remote  history of biliary pancreatitis complicated by pancreatic pseudocyst for  which he has recovered. I last saw the patient in May of 2006 when he  underwent screening colonoscopy. This was normal except for mild  diverticulosis and a diminutive colon polyp. He presents now with a six-  month history of problems with belching. He was having some problems with  indigestion and heartburn. He was placed on Prevacid. Also, some epigastric  discomfort at that time which resolved. Currently with no pain, though he  continues with belching. Intermittent constipation with associated abdominal  discomfort which also has resolved. No melena or hematochezia. No fevers or  pain reminiscent of pancreatitis. Urine color is normal. Patient has good  appetite and stable weight.   PAST MEDICAL HISTORY:  1. Hypertension.  2. Hyperlipidemia.  3. History of biliary pancreatitis complicated by pancreatic pseudocyst.  4. Reflux disease.   PAST SURGICAL HISTORY:  1. Cholecystectomy.  2. Hemorrhoidectomy.   ALLERGIES:  CT DYE or CT CONTRAST.   CURRENT MEDICATIONS:  1. Creon 10 before meals.  2. Ranitidine 300 mg daily, recently changed to Prevacid 30 mg daily.  3. Diovan 160 mg daily.  4. Tums with calcium p.r.n.   FAMILY HISTORY:  Negative for gastrointestinal malignancy.   SOCIAL HISTORY:  Patient is married with one daughter, lives with his wife.  He attended college. He works as a Human resources officer. He quit smoking  nine years ago. He occasionally drinks ale.   REVIEW OF SYSTEMS:  For diagnostic evaluation  form.   PHYSICAL EXAMINATION:  GENERAL: Well appearing male in no acute distress.  VITAL SIGNS: Blood pressure 114/80, heart rate 88, weight 204.8, height 5'10  1/2.  HEENT: Sclerae anicteric.  Conjunctivae are pink.  Oral mucosa is intact.  There is no adenopathy.  LUNGS: Clear.  HEART: Regular.  ABDOMEN: Soft without tenderness, mass or hernia. No succussion splash.  Good bowel sounds heard.  EXTREMITIES: Without edema.   IMPRESSION:  1. Problems with increased intestinal gas manifested principally by      belching. No worrisome features.  2. Gastroesophageal reflux disease. Classic symptoms improved on Prevacid.  3. History of colon polyps.  4. Remote history of biliary pancreatitis, complicated by pancreatic      pseudocyst.   RECOMMENDATIONS:  1. Empiric trial of Flagyl 500 mg b.i.d. x 10 days, should the patient      have bacterial overgrowth or possibly Giardia to account for his      increased intestinal gas.  2. Gas and flatulence dietary sheet.  3. Upper endoscopy to evaluate his peptic symptoms as well as a screen for      Barrett's Esophagus. The nature of the procedure as  well as risks,      benefits and alternatives have been reviewed. He understood and agreed      to proceed.            ______________________________  Wilhemina Bonito. Eda Keys., MD      JNP/MedQ  DD:  08/04/2006  DT:  08/06/2006  Job #:  [id number]   cc:   Georgina Quint. Plotnikov, MD

## 2011-03-30 ENCOUNTER — Encounter: Payer: Self-pay | Admitting: Internal Medicine

## 2011-03-30 ENCOUNTER — Ambulatory Visit (INDEPENDENT_AMBULATORY_CARE_PROVIDER_SITE_OTHER): Payer: 59 | Admitting: Internal Medicine

## 2011-03-30 VITALS — BP 110/80 | HR 80 | Temp 98.0°F | Resp 16 | Ht 70.5 in | Wt 201.0 lb

## 2011-03-30 DIAGNOSIS — Z136 Encounter for screening for cardiovascular disorders: Secondary | ICD-10-CM

## 2011-03-30 DIAGNOSIS — R5383 Other fatigue: Secondary | ICD-10-CM

## 2011-03-30 DIAGNOSIS — Z23 Encounter for immunization: Secondary | ICD-10-CM

## 2011-03-30 DIAGNOSIS — Z0000 Encounter for general adult medical examination without abnormal findings: Secondary | ICD-10-CM

## 2011-03-30 DIAGNOSIS — E039 Hypothyroidism, unspecified: Secondary | ICD-10-CM

## 2011-03-30 DIAGNOSIS — R7309 Other abnormal glucose: Secondary | ICD-10-CM

## 2011-03-30 DIAGNOSIS — Z7902 Long term (current) use of antithrombotics/antiplatelets: Secondary | ICD-10-CM | POA: Insufficient documentation

## 2011-03-30 DIAGNOSIS — R5381 Other malaise: Secondary | ICD-10-CM

## 2011-03-30 DIAGNOSIS — I10 Essential (primary) hypertension: Secondary | ICD-10-CM

## 2011-03-30 MED ORDER — LOSARTAN POTASSIUM 100 MG PO TABS: 100.0000 mg | ORAL_TABLET | Freq: Every day | ORAL | Status: DC

## 2011-03-30 NOTE — Progress Notes (Signed)
Addended by: Merrilyn Puma on: 03/30/2011 11:52 AM   Modules accepted: Orders

## 2011-03-30 NOTE — Assessment & Plan Note (Signed)
Better. See meds

## 2011-03-30 NOTE — Assessment & Plan Note (Signed)
On rx 

## 2011-03-30 NOTE — Assessment & Plan Note (Signed)
We discussed age appropriate health related issues, including available/recomended screening tests and vaccinations. We discussed a need for adhering to healthy diet and exercise. Labs/EKG were reviewed/ordered. All questions were answered.  Will get labs later

## 2011-03-30 NOTE — Progress Notes (Signed)
  Subjective:    Patient ID: Brian Mays, male    DOB: Aug 22, 1946, 65 y.o.   MRN: [id number]  HPI  The patient is here for a wellness exam. The patient has been doing well overall without major physical or psychological issues going on lately. The patient needs to address  chronic hypertension that has been well controlled with medicines; to address chronic  hyperlipidemia controlled with medicines as well; and to address hypothyroidism and carcinoid, controlled with medical treatment and diet.   Review of Systems  Constitutional: Negative for appetite change, fatigue and unexpected weight change.  HENT: Negative for nosebleeds, congestion, sore throat, sneezing, trouble swallowing and neck pain.   Eyes: Negative for itching and visual disturbance.  Respiratory: Negative for cough.   Cardiovascular: Negative for chest pain, palpitations and leg swelling.  Gastrointestinal: Negative for nausea, diarrhea, blood in stool and abdominal distention.  Genitourinary: Negative for frequency and hematuria.  Musculoskeletal: Negative for back pain, joint swelling and gait problem.  Skin: Negative for rash.  Neurological: Negative for dizziness, tremors, speech difficulty and weakness.  Psychiatric/Behavioral: Negative for sleep disturbance, dysphoric mood and agitation. The patient is not nervous/anxious.        Objective:   Physical Exam  Constitutional: He is oriented to person, place, and time. He appears well-developed.  HENT:  Mouth/Throat: Oropharynx is clear and moist.  Eyes: Conjunctivae are normal. Pupils are equal, round, and reactive to light.  Neck: Normal range of motion. No JVD present. No thyromegaly present.  Cardiovascular: Normal rate, regular rhythm, normal heart sounds and intact distal pulses.  Exam reveals no gallop and no friction rub.   No murmur heard. Pulmonary/Chest: Effort normal and breath sounds normal. No respiratory distress. He has no wheezes. He has no rales.  He exhibits no tenderness.  Abdominal: Soft. Bowel sounds are normal. He exhibits no distension and no mass. There is no tenderness. There is no rebound and no guarding.  Genitourinary: Rectum normal. Guaiac negative stool.       Prostate 1 +  Musculoskeletal: Normal range of motion. He exhibits no edema and no tenderness.  Lymphadenopathy:    He has no cervical adenopathy.  Neurological: He is alert and oriented to person, place, and time. He has normal reflexes. No cranial nerve deficit. He exhibits normal muscle tone. Coordination normal.  Skin: Skin is warm and dry. No rash noted.  Psychiatric: He has a normal mood and affect. His behavior is normal. Judgment and thought content normal.          Assessment & Plan:  Well adult exam We discussed age appropriate health related issues, including available/recomended screening tests and vaccinations. We discussed a need for adhering to healthy diet and exercise. Labs/EKG were reviewed/ordered. All questions were answered.  Will get labs later  HYPOTHYROIDISM On rx  HYPERTENSION Better. See meds  HYPERGLYCEMIA Monitor wt Wt Readings from Last 3 Encounters:  03/30/11 201 lb (91.173 kg)  10/13/09 203 lb (92.08 kg)  06/19/09 204 lb (92.534 kg)        Fatigue    Check labs

## 2011-03-30 NOTE — Assessment & Plan Note (Signed)
Monitor wt Wt Readings from Last 3 Encounters:  03/30/11 201 lb (91.173 kg)  10/13/09 203 lb (92.08 kg)  06/19/09 204 lb (92.534 kg)

## 2011-04-02 ENCOUNTER — Encounter (HOSPITAL_BASED_OUTPATIENT_CLINIC_OR_DEPARTMENT_OTHER): Payer: 59 | Admitting: Hematology & Oncology

## 2011-04-02 DIAGNOSIS — C787 Secondary malignant neoplasm of liver and intrahepatic bile duct: Secondary | ICD-10-CM

## 2011-04-02 DIAGNOSIS — C7A00 Malignant carcinoid tumor of unspecified site: Secondary | ICD-10-CM

## 2011-04-19 ENCOUNTER — Other Ambulatory Visit (INDEPENDENT_AMBULATORY_CARE_PROVIDER_SITE_OTHER): Payer: 59 | Admitting: Internal Medicine

## 2011-04-19 ENCOUNTER — Telehealth: Payer: Self-pay | Admitting: Internal Medicine

## 2011-04-19 ENCOUNTER — Other Ambulatory Visit (INDEPENDENT_AMBULATORY_CARE_PROVIDER_SITE_OTHER): Payer: 59

## 2011-04-19 DIAGNOSIS — R5383 Other fatigue: Secondary | ICD-10-CM

## 2011-04-19 DIAGNOSIS — E538 Deficiency of other specified B group vitamins: Secondary | ICD-10-CM

## 2011-04-19 DIAGNOSIS — R5381 Other malaise: Secondary | ICD-10-CM

## 2011-04-19 DIAGNOSIS — R7309 Other abnormal glucose: Secondary | ICD-10-CM

## 2011-04-19 DIAGNOSIS — E039 Hypothyroidism, unspecified: Secondary | ICD-10-CM

## 2011-04-19 LAB — VITAMIN B12: Vitamin B-12: 225 pg/mL (ref 211–911)

## 2011-04-19 LAB — URINALYSIS, ROUTINE W REFLEX MICROSCOPIC
Bilirubin Urine: NEGATIVE
Ketones, ur: NEGATIVE
Leukocytes, UA: NEGATIVE
Urine Glucose: NEGATIVE
pH: 6 (ref 5.0–8.0)

## 2011-04-19 LAB — COMPREHENSIVE METABOLIC PANEL
AST: 30 U/L (ref 0–37)
Albumin: 3.9 g/dL (ref 3.5–5.2)
BUN: 15 mg/dL (ref 6–23)
Calcium: 8.6 mg/dL (ref 8.4–10.5)
Chloride: 99 mEq/L (ref 96–112)
Glucose, Bld: 84 mg/dL (ref 70–99)
Potassium: 4 mEq/L (ref 3.5–5.1)
Total Protein: 7.6 g/dL (ref 6.0–8.3)

## 2011-04-19 LAB — TESTOSTERONE: Testosterone: 409.24 ng/dL (ref 350.00–890.00)

## 2011-04-19 MED ORDER — VITAMIN B-12 1000 MCG SL SUBL: 1.0000 | SUBLINGUAL_TABLET | Freq: Every day | SUBLINGUAL | Status: DC

## 2011-04-19 NOTE — Telephone Encounter (Signed)
Brian Mays, please, inform patient that all labs are normal except for borderline low Vit B12 Start SL B12 Come for a b12 im inj x1 - nursing visit RTC 2 mo lab Vit B12 level Thx

## 2011-04-20 NOTE — Telephone Encounter (Signed)
Pt informed

## 2011-04-21 ENCOUNTER — Ambulatory Visit (INDEPENDENT_AMBULATORY_CARE_PROVIDER_SITE_OTHER): Payer: 59 | Admitting: *Deleted

## 2011-04-21 DIAGNOSIS — E559 Vitamin D deficiency, unspecified: Secondary | ICD-10-CM

## 2011-04-21 MED ORDER — CYANOCOBALAMIN 1000 MCG/ML IJ SOLN
1000.0000 ug | Freq: Once | INTRAMUSCULAR | Status: AC
  Administered 2011-04-21: 1000 ug via INTRAMUSCULAR

## 2011-04-30 ENCOUNTER — Encounter (HOSPITAL_BASED_OUTPATIENT_CLINIC_OR_DEPARTMENT_OTHER): Payer: 59 | Admitting: Hematology & Oncology

## 2011-04-30 ENCOUNTER — Other Ambulatory Visit: Payer: Self-pay | Admitting: Hematology & Oncology

## 2011-04-30 DIAGNOSIS — C787 Secondary malignant neoplasm of liver and intrahepatic bile duct: Secondary | ICD-10-CM

## 2011-04-30 DIAGNOSIS — C7A00 Malignant carcinoid tumor of unspecified site: Secondary | ICD-10-CM

## 2011-04-30 DIAGNOSIS — Z23 Encounter for immunization: Secondary | ICD-10-CM

## 2011-04-30 DIAGNOSIS — C800 Disseminated malignant neoplasm, unspecified: Secondary | ICD-10-CM

## 2011-04-30 DIAGNOSIS — R197 Diarrhea, unspecified: Secondary | ICD-10-CM

## 2011-04-30 LAB — CBC WITH DIFFERENTIAL (CANCER CENTER ONLY)
BASO#: 0 10*3/uL (ref 0.0–0.2)
Eosinophils Absolute: 0.3 10*3/uL (ref 0.0–0.5)
HCT: 39.3 % (ref 38.7–49.9)
HGB: 13.4 g/dL (ref 13.0–17.1)
LYMPH%: 15.2 % (ref 14.0–48.0)
MCH: 29.2 pg (ref 28.0–33.4)
MCV: 86 fL (ref 82–98)
MONO#: 0.5 10*3/uL (ref 0.1–0.9)
MONO%: 7.7 % (ref 0.0–13.0)
Platelets: 150 10*3/uL (ref 145–400)
RBC: 4.59 10*6/uL (ref 4.20–5.70)
WBC: 6.4 10*3/uL (ref 4.0–10.0)

## 2011-05-04 LAB — CHROMOGRANIN A: Chromogranin A: 12 ng/mL (ref 1.9–15.0)

## 2011-05-04 LAB — COMPREHENSIVE METABOLIC PANEL
BUN: 14 mg/dL (ref 6–23)
CO2: 27 mEq/L (ref 19–32)
Creatinine, Ser: 0.93 mg/dL (ref 0.50–1.35)
Glucose, Bld: 126 mg/dL — ABNORMAL HIGH (ref 70–99)
Total Bilirubin: 0.5 mg/dL (ref 0.3–1.2)

## 2011-05-04 LAB — LACTATE DEHYDROGENASE: LDH: 166 U/L (ref 94–250)

## 2011-05-10 ENCOUNTER — Other Ambulatory Visit: Payer: Self-pay | Admitting: Internal Medicine

## 2011-05-20 ENCOUNTER — Encounter (HOSPITAL_COMMUNITY)
Admission: RE | Admit: 2011-05-20 | Discharge: 2011-05-20 | Disposition: A | Discharge status: Home / Self Care | Payer: 59 | Source: Ambulatory Visit | Attending: Hematology & Oncology | Admitting: Hematology & Oncology

## 2011-05-20 ENCOUNTER — Encounter (HOSPITAL_COMMUNITY): Payer: Self-pay

## 2011-05-20 DIAGNOSIS — C7A00 Malignant carcinoid tumor of unspecified site: Secondary | ICD-10-CM | POA: Insufficient documentation

## 2011-05-20 DIAGNOSIS — C7B8 Other secondary neuroendocrine tumors: Secondary | ICD-10-CM | POA: Insufficient documentation

## 2011-05-20 DIAGNOSIS — R599 Enlarged lymph nodes, unspecified: Secondary | ICD-10-CM | POA: Insufficient documentation

## 2011-05-20 DIAGNOSIS — C800 Disseminated malignant neoplasm, unspecified: Secondary | ICD-10-CM

## 2011-05-20 IMAGING — PT NM PET TUM IMG RESTAG (PS) SKULL BASE T - THIGH
6 series · 25 of 25 positions shown · non-contrast
Comparison: [DATE] PET and abdominal CT.

CLINICAL DATA: Subsequent treatment strategy for disseminated
malignant neoplasm.  Being treated for carcinoid tumor.

NUCLEAR MEDICINE PET CT SKULL BASE TO THIGH
TECHNIQUE: 18.0 mCi F-18 FDG was injected intravenously via the
right wrist.  Full-ring PET imaging was performed from the skull
base through the mid-thighs 60  minutes after injection.  CT data
was obtained and used for attenuation correction and anatomic
localization only.  (This was not acquired as a diagnostic CT
examination.)
Fasting Blood Glucose:  100

[Series 1: pet ac · axial · 3.3mm · 4.69mm/px · z∈[-870,+0]mm · 5 of 267 slices shown]
[im 1/267]
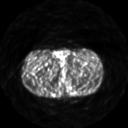
[im 67/267]
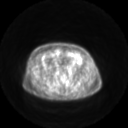
[im 134/267]
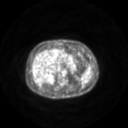
[im 200/267]
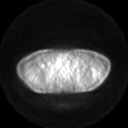
[im 267/267]
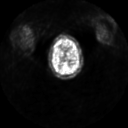

[Series 2: ct images · axial · 3.8mm · 0.98mm/px · z∈[-870,+0]mm · 5 of 267 slices shown]
[im 1/267]
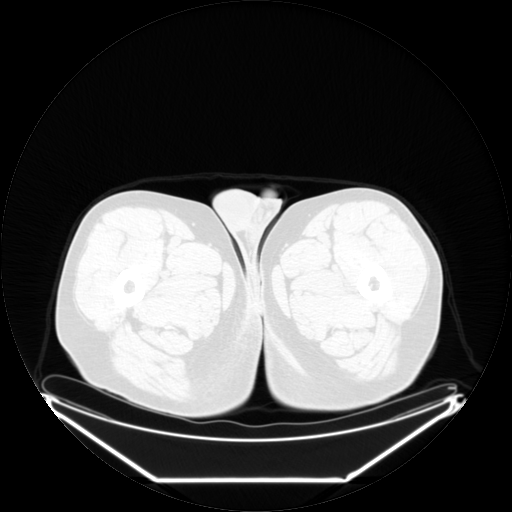
[im 67/267]
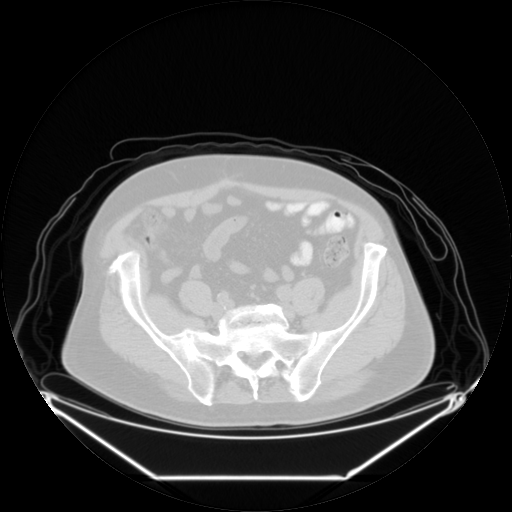
[im 134/267]
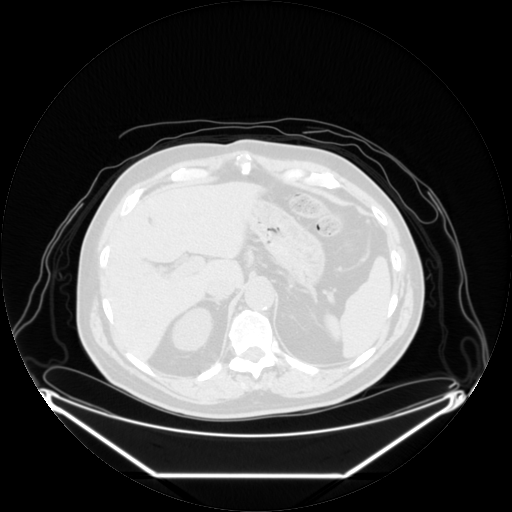
[im 200/267]
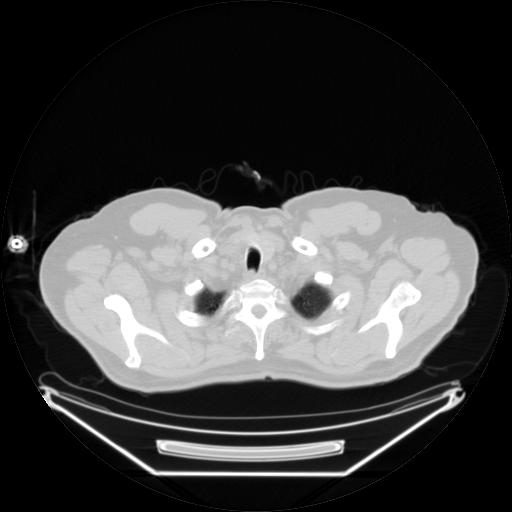
[im 267/267  brain]
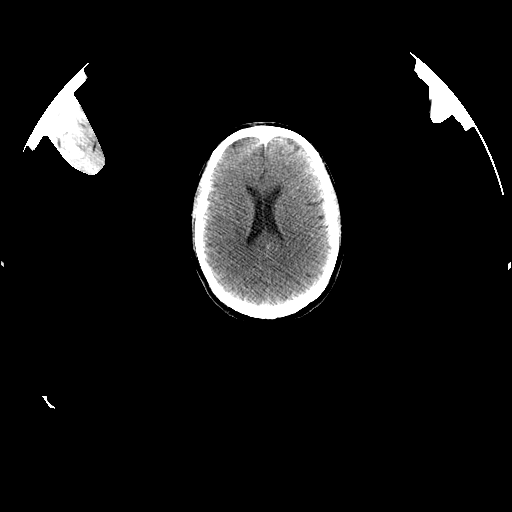

[Series 2: pet nac · axial · 3.3mm · 4.69mm/px · z∈[-870,+0]mm · 6 of 267 slices shown]
[im 1/267]
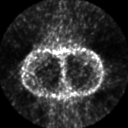
[im 54/267]
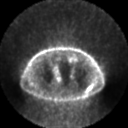
[im 107/267]
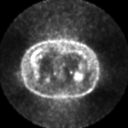
[im 160/267]
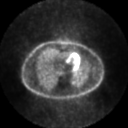
[im 213/267]
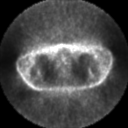
[im 267/267]
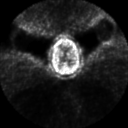

[Series 123: mip · coronal · 3.3mm · 4.69mm/px · 1 of 30 slices shown]
[im 1/30]
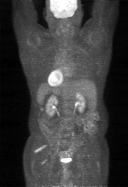

[Series 151: reformatted · axial · 3.3mm · 3.91mm/px · z∈[-870,+0]mm · 6 of 265 slices shown (1 of 2)]
[im 1/265]
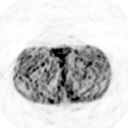
[im 53/265]
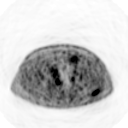
[im 106/265]
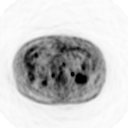
[im 159/265]
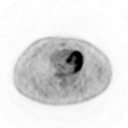
[im 212/265]
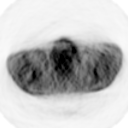
[im 265/265]
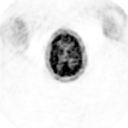

[Series 153: reformatted · coronal · 4.7mm · 6.98mm/px · 2 of 73 slices shown (2 of 2)]
[im 1/73]
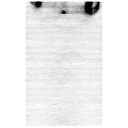
[im 73/73]
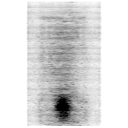

[25 of 25 positions shown; findings below may reference images not displayed]

FINDINGS: PET images demonstrate no abnormal activity within the
neck or chest.  Similar photopenia surrounding the partially
exophytic low density inferior right hepatic lobe mass.  This
measures 6.9 x 7.3 cm on image 159 of series 2.  At the same level
on the prior exam, this measures 6.7 x 7.2 cm (when remeasured).

There is mild heterogeneous uptake throughout the liver without
focal hypermetabolism.

The partially calcified central mesenteric mass remains non
hypermetabolic to the surrounding blood pool.  This measures 2.6 x
2.6 cm on image 184 versus 3.0 x 2.5 cm on the prior exam (when
remeasured).

A focus of hypermetabolism is identified within the left gluteus
maximus muscle.  This measures a S.U.V. max of 6.8 and is without
CT correlate on image 213.  This is somewhat linear.  More
inferiorly, a separate area of left gluteal hypermetabolism is more
mild.

CT images performed for attenuation correction demonstrate mucosal
thickening in the right maxillary sinus.  No significant findings
within the neck.  Minimal right-sided mastoid fluid which is
unchanged.

Multivessel coronary artery atherosclerosis.  Calcified left upper
lobe granuloma.  Scattered other smaller low density liver lesions
which are unchanged.  Similar mild proximal jejunal mesenteric
adenopathy and increased density of mesenteric fat.  Fat containing
inguinal hernias bilaterally.  Right SI joint partial fusion,
likely degenerative.  Ethmoid air cell mucosal thickening.
IMPRESSION: 1.  No evidence of hypermetabolism within the dominant right
hepatic lobe metastasis.
2.  Similar to decreased size of a calcified mesenteric metastasis,
without significant hypermetabolism.
3.  Hypermetabolism within the left gluteal musculature without CT
correlate.  Favored to be secondary to muscular strain or recent
trauma.  If no such history, and a strong clinical concern of
muscular metastasis exists, contrast enhanced pelvic MRI (using a
dedicated bone technique) should be considered.
4.  Mildly heterogeneous uptake throughout the liver without focal
abnormality to suggest hypermetabolic metastatic disease

## 2011-05-20 MED ORDER — FLUDEOXYGLUCOSE F - 18 (FDG) INJECTION
18.0000 | Freq: Once | INTRAVENOUS | Status: AC | PRN
  Administered 2011-05-20: 18 via INTRAVENOUS

## 2011-05-26 ENCOUNTER — Other Ambulatory Visit: Payer: Self-pay | Admitting: Internal Medicine

## 2011-05-28 ENCOUNTER — Encounter (HOSPITAL_BASED_OUTPATIENT_CLINIC_OR_DEPARTMENT_OTHER): Payer: 59 | Admitting: Hematology & Oncology

## 2011-05-28 ENCOUNTER — Other Ambulatory Visit: Payer: Self-pay | Admitting: Hematology & Oncology

## 2011-05-28 DIAGNOSIS — C7A00 Malignant carcinoid tumor of unspecified site: Secondary | ICD-10-CM

## 2011-05-28 DIAGNOSIS — Z23 Encounter for immunization: Secondary | ICD-10-CM

## 2011-05-28 DIAGNOSIS — C787 Secondary malignant neoplasm of liver and intrahepatic bile duct: Secondary | ICD-10-CM

## 2011-05-28 LAB — CBC WITH DIFFERENTIAL (CANCER CENTER ONLY)
BASO#: 0 10*3/uL (ref 0.0–0.2)
EOS%: 5.2 % (ref 0.0–7.0)
LYMPH%: 14.6 % (ref 14.0–48.0)
MCH: 29.4 pg (ref 28.0–33.4)
MCHC: 34.4 g/dL (ref 32.0–35.9)
MONO%: 7.2 % (ref 0.0–13.0)
NEUT#: 4.4 10*3/uL (ref 1.5–6.5)
Platelets: 168 10*3/uL (ref 145–400)

## 2011-06-03 LAB — COMPREHENSIVE METABOLIC PANEL
AST: 26 U/L (ref 0–37)
Albumin: 3.8 g/dL (ref 3.5–5.2)
Alkaline Phosphatase: 66 U/L (ref 39–117)
BUN: 16 mg/dL (ref 6–23)
Potassium: 4.3 mEq/L (ref 3.5–5.3)
Total Bilirubin: 0.6 mg/dL (ref 0.3–1.2)

## 2011-06-03 LAB — CHROMOGRANIN A: Chromogranin A: 17 ng/mL — ABNORMAL HIGH (ref 1.9–15.0)

## 2011-06-10 ENCOUNTER — Other Ambulatory Visit: Payer: Self-pay | Admitting: Internal Medicine

## 2011-06-22 ENCOUNTER — Encounter: Payer: Self-pay | Admitting: Internal Medicine

## 2011-06-22 ENCOUNTER — Ambulatory Visit (INDEPENDENT_AMBULATORY_CARE_PROVIDER_SITE_OTHER): Payer: 59 | Admitting: Internal Medicine

## 2011-06-22 DIAGNOSIS — E538 Deficiency of other specified B group vitamins: Secondary | ICD-10-CM

## 2011-06-22 DIAGNOSIS — E039 Hypothyroidism, unspecified: Secondary | ICD-10-CM

## 2011-06-22 DIAGNOSIS — K649 Unspecified hemorrhoids: Secondary | ICD-10-CM

## 2011-06-22 DIAGNOSIS — I10 Essential (primary) hypertension: Secondary | ICD-10-CM

## 2011-06-22 DIAGNOSIS — D499 Neoplasm of unspecified behavior of unspecified site: Secondary | ICD-10-CM

## 2011-06-22 MED ORDER — HYDROCORTISONE ACETATE 25 MG RE SUPP: 25.0000 mg | Freq: Two times a day (BID) | RECTAL | Status: DC

## 2011-06-22 NOTE — Assessment & Plan Note (Signed)
On Rx 

## 2011-06-22 NOTE — Assessment & Plan Note (Signed)
On Octreotide Dr Myna Hidalgo

## 2011-06-22 NOTE — Progress Notes (Signed)
  Subjective:    Patient ID: EATHEN BUDREAU, male    DOB: May 04, 1946, 65 y.o.   MRN: [id number]  HPI The patient presents for a follow-up of  chronic hypertension, B12 def and hypothyroidism, GERD controlled with medicines     Review of Systems  Constitutional: Positive for fatigue. Negative for appetite change and unexpected weight change.  HENT: Negative for nosebleeds, congestion, sore throat, sneezing, trouble swallowing and neck pain.   Eyes: Negative for itching and visual disturbance.  Respiratory: Negative for cough.   Cardiovascular: Negative for chest pain, palpitations and leg swelling.  Gastrointestinal: Negative for nausea, diarrhea, blood in stool and abdominal distention.  Genitourinary: Negative for frequency and hematuria.  Musculoskeletal: Negative for back pain, joint swelling and gait problem.  Skin: Negative for rash.  Neurological: Negative for dizziness, tremors, speech difficulty and weakness.  Psychiatric/Behavioral: Negative for sleep disturbance, dysphoric mood and agitation. The patient is not nervous/anxious.    Wt Readings from Last 3 Encounters:  06/22/11 198 lb (89.812 kg)  03/30/11 201 lb (91.173 kg)  10/13/09 203 lb (92.08 kg)       Objective:   Physical Exam  Constitutional: He is oriented to person, place, and time. He appears well-developed. No distress.  HENT:  Mouth/Throat: Oropharynx is clear and moist.  Eyes: Conjunctivae are normal. Pupils are equal, round, and reactive to light.  Neck: Normal range of motion. No JVD present. No thyromegaly present.  Cardiovascular: Normal rate, regular rhythm, normal heart sounds and intact distal pulses.  Exam reveals no gallop and no friction rub.   No murmur heard. Pulmonary/Chest: Effort normal and breath sounds normal. No respiratory distress. He has no wheezes. He has no rales. He exhibits no tenderness.  Abdominal: Soft. Bowel sounds are normal. He exhibits no distension and no mass. There is no  tenderness. There is no rebound and no guarding.  Musculoskeletal: Normal range of motion. He exhibits no edema and no tenderness.  Lymphadenopathy:    He has no cervical adenopathy.  Neurological: He is alert and oriented to person, place, and time. He has normal reflexes. No cranial nerve deficit. He exhibits normal muscle tone. Coordination normal.  Skin: Skin is warm and dry. No rash noted.  Psychiatric: He has a normal mood and affect. His behavior is normal. Judgment and thought content normal.          Assessment & Plan:

## 2011-06-22 NOTE — Assessment & Plan Note (Signed)
On Rx - check labs

## 2011-06-22 NOTE — Assessment & Plan Note (Signed)
On Rx prn 

## 2011-06-25 ENCOUNTER — Encounter (HOSPITAL_BASED_OUTPATIENT_CLINIC_OR_DEPARTMENT_OTHER): Payer: 59 | Admitting: Hematology & Oncology

## 2011-06-25 DIAGNOSIS — C7A00 Malignant carcinoid tumor of unspecified site: Secondary | ICD-10-CM

## 2011-06-25 DIAGNOSIS — C787 Secondary malignant neoplasm of liver and intrahepatic bile duct: Secondary | ICD-10-CM

## 2011-07-23 ENCOUNTER — Encounter (HOSPITAL_BASED_OUTPATIENT_CLINIC_OR_DEPARTMENT_OTHER): Payer: 59 | Admitting: Hematology & Oncology

## 2011-07-23 DIAGNOSIS — C7A00 Malignant carcinoid tumor of unspecified site: Secondary | ICD-10-CM

## 2011-07-23 DIAGNOSIS — C787 Secondary malignant neoplasm of liver and intrahepatic bile duct: Secondary | ICD-10-CM

## 2011-08-20 ENCOUNTER — Other Ambulatory Visit: Payer: Self-pay | Admitting: Hematology & Oncology

## 2011-08-20 ENCOUNTER — Encounter (HOSPITAL_BASED_OUTPATIENT_CLINIC_OR_DEPARTMENT_OTHER): Payer: 59 | Admitting: Hematology & Oncology

## 2011-08-20 DIAGNOSIS — Z23 Encounter for immunization: Secondary | ICD-10-CM

## 2011-08-20 DIAGNOSIS — C787 Secondary malignant neoplasm of liver and intrahepatic bile duct: Secondary | ICD-10-CM

## 2011-08-20 DIAGNOSIS — C7A00 Malignant carcinoid tumor of unspecified site: Secondary | ICD-10-CM

## 2011-08-20 LAB — CBC WITH DIFFERENTIAL (CANCER CENTER ONLY)
BASO#: 0 10*3/uL (ref 0.0–0.2)
BASO%: 0.7 % (ref 0.0–2.0)
EOS%: 6.6 % (ref 0.0–7.0)
HGB: 14.1 g/dL (ref 13.0–17.1)
LYMPH#: 1.3 10*3/uL (ref 0.9–3.3)
MCHC: 34.4 g/dL (ref 32.0–35.9)
MONO#: 0.4 10*3/uL (ref 0.1–0.9)
NEUT#: 3.9 10*3/uL (ref 1.5–6.5)
RDW: 13.9 % (ref 11.1–15.7)
WBC: 6.1 10*3/uL (ref 4.0–10.0)

## 2011-08-25 ENCOUNTER — Encounter: Payer: Self-pay | Admitting: *Deleted

## 2011-08-28 LAB — COMPREHENSIVE METABOLIC PANEL
Albumin: 4 g/dL (ref 3.5–5.2)
Alkaline Phosphatase: 69 U/L (ref 39–117)
Calcium: 9.2 mg/dL (ref 8.4–10.5)
Chloride: 103 mEq/L (ref 96–112)
Glucose, Bld: 90 mg/dL (ref 70–99)
Potassium: 4.2 mEq/L (ref 3.5–5.3)
Sodium: 142 mEq/L (ref 135–145)
Total Protein: 7.5 g/dL (ref 6.0–8.3)

## 2011-08-28 LAB — CHROMOGRANIN A: Chromogranin A: 6.8 ng/mL (ref 1.9–15.0)

## 2011-09-17 ENCOUNTER — Ambulatory Visit (HOSPITAL_BASED_OUTPATIENT_CLINIC_OR_DEPARTMENT_OTHER): Payer: 59

## 2011-09-17 ENCOUNTER — Other Ambulatory Visit: Payer: Self-pay | Admitting: Family

## 2011-09-17 DIAGNOSIS — D499 Neoplasm of unspecified behavior of unspecified site: Secondary | ICD-10-CM

## 2011-09-17 DIAGNOSIS — C787 Secondary malignant neoplasm of liver and intrahepatic bile duct: Secondary | ICD-10-CM

## 2011-09-17 DIAGNOSIS — C7A00 Malignant carcinoid tumor of unspecified site: Secondary | ICD-10-CM

## 2011-09-17 MED ORDER — OCTREOTIDE ACETATE 30 MG IM KIT
30.0000 mg | PACK | Freq: Once | INTRAMUSCULAR | Status: AC
  Administered 2011-09-17: 30 mg via INTRAMUSCULAR

## 2011-09-30 ENCOUNTER — Encounter: Payer: Self-pay | Admitting: Internal Medicine

## 2011-09-30 ENCOUNTER — Ambulatory Visit (INDEPENDENT_AMBULATORY_CARE_PROVIDER_SITE_OTHER): Payer: 59 | Admitting: Internal Medicine

## 2011-09-30 VITALS — BP 130/84 | HR 76 | Temp 98.5°F | Resp 16 | Ht 70.0 in | Wt 205.0 lb

## 2011-09-30 DIAGNOSIS — I10 Essential (primary) hypertension: Secondary | ICD-10-CM

## 2011-09-30 DIAGNOSIS — Z23 Encounter for immunization: Secondary | ICD-10-CM

## 2011-09-30 DIAGNOSIS — E538 Deficiency of other specified B group vitamins: Secondary | ICD-10-CM

## 2011-09-30 DIAGNOSIS — L309 Dermatitis, unspecified: Secondary | ICD-10-CM

## 2011-09-30 DIAGNOSIS — R7309 Other abnormal glucose: Secondary | ICD-10-CM

## 2011-09-30 DIAGNOSIS — L259 Unspecified contact dermatitis, unspecified cause: Secondary | ICD-10-CM

## 2011-09-30 DIAGNOSIS — K219 Gastro-esophageal reflux disease without esophagitis: Secondary | ICD-10-CM

## 2011-09-30 DIAGNOSIS — D499 Neoplasm of unspecified behavior of unspecified site: Secondary | ICD-10-CM

## 2011-09-30 MED ORDER — TRIAMCINOLONE ACETONIDE 0.5 % EX OINT: TOPICAL_OINTMENT | Freq: Two times a day (BID) | CUTANEOUS | Status: DC

## 2011-09-30 MED ORDER — CLOBETASOL PROPIONATE 0.05 % EX FOAM: Freq: Two times a day (BID) | CUTANEOUS | Status: DC

## 2011-09-30 NOTE — Assessment & Plan Note (Signed)
Get labs

## 2011-09-30 NOTE — Progress Notes (Signed)
  Subjective:    Patient ID: Brian Mays, male    DOB: 12-Oct-1946, 65 y.o.   MRN: [id number]  HPI   The patient is here to follow up on chronic fatigue, depression, anxiety,moderate fibromyalgia symptoms. C/o rash on scalp   Review of Systems  Constitutional: Negative for appetite change, fatigue and unexpected weight change.  HENT: Negative for nosebleeds, congestion, sore throat, sneezing, trouble swallowing and neck pain.   Eyes: Negative for itching and visual disturbance.  Respiratory: Negative for cough.   Cardiovascular: Negative for chest pain, palpitations and leg swelling.  Gastrointestinal: Negative for nausea, diarrhea, blood in stool and abdominal distention.  Genitourinary: Negative for frequency and hematuria.  Musculoskeletal: Negative for back pain, joint swelling and gait problem.  Skin: Positive for rash (on scalp).  Neurological: Negative for dizziness, tremors, speech difficulty and weakness.  Psychiatric/Behavioral: Negative for sleep disturbance, dysphoric mood and agitation. The patient is not nervous/anxious.        Objective:   Physical Exam  Constitutional: He is oriented to person, place, and time. He appears well-developed.  HENT:  Mouth/Throat: Oropharynx is clear and moist.  Eyes: Conjunctivae are normal. Pupils are equal, round, and reactive to light.  Neck: Normal range of motion. No JVD present. No thyromegaly present.  Cardiovascular: Normal rate, regular rhythm, normal heart sounds and intact distal pulses.  Exam reveals no gallop and no friction rub.   No murmur heard. Pulmonary/Chest: Effort normal and breath sounds normal. No respiratory distress. He has no wheezes. He has no rales. He exhibits no tenderness.  Abdominal: Soft. Bowel sounds are normal. He exhibits no distension and no mass. There is no tenderness. There is no rebound and no guarding.  Musculoskeletal: Normal range of motion. He exhibits no edema and no tenderness.    Lymphadenopathy:    He has no cervical adenopathy.  Neurological: He is alert and oriented to person, place, and time. He has normal reflexes. No cranial nerve deficit. He exhibits normal muscle tone. Coordination normal.  Skin: Skin is warm and dry. Rash: a couple 3 mm crusted   Psychiatric: He has a normal mood and affect. His behavior is normal. Judgment and thought content normal.          Assessment & Plan:

## 2011-09-30 NOTE — Assessment & Plan Note (Signed)
Continue with current prescription therapy as reflected on the Med list.  

## 2011-09-30 NOTE — Assessment & Plan Note (Signed)
2009 Dr Myna Hidalgo

## 2011-09-30 NOTE — Assessment & Plan Note (Signed)
Check labs 

## 2011-10-15 ENCOUNTER — Ambulatory Visit (HOSPITAL_BASED_OUTPATIENT_CLINIC_OR_DEPARTMENT_OTHER): Payer: 59

## 2011-10-15 VITALS — BP 132/88 | HR 88 | Temp 97.0°F

## 2011-10-15 DIAGNOSIS — D499 Neoplasm of unspecified behavior of unspecified site: Secondary | ICD-10-CM

## 2011-10-15 DIAGNOSIS — C7A00 Malignant carcinoid tumor of unspecified site: Secondary | ICD-10-CM

## 2011-10-15 DIAGNOSIS — C787 Secondary malignant neoplasm of liver and intrahepatic bile duct: Secondary | ICD-10-CM

## 2011-10-15 MED ORDER — OCTREOTIDE ACETATE 30 MG IM KIT
30.0000 mg | PACK | Freq: Once | INTRAMUSCULAR | Status: AC
  Administered 2011-10-15: 30 mg via INTRAMUSCULAR

## 2011-11-12 ENCOUNTER — Ambulatory Visit: Payer: 59 | Admitting: Hematology & Oncology

## 2011-11-12 ENCOUNTER — Other Ambulatory Visit: Payer: 59 | Admitting: Lab

## 2011-11-19 ENCOUNTER — Other Ambulatory Visit (HOSPITAL_BASED_OUTPATIENT_CLINIC_OR_DEPARTMENT_OTHER): Payer: 59 | Admitting: Lab

## 2011-11-19 ENCOUNTER — Ambulatory Visit (HOSPITAL_BASED_OUTPATIENT_CLINIC_OR_DEPARTMENT_OTHER): Payer: 59 | Admitting: Hematology & Oncology

## 2011-11-19 VITALS — BP 150/95 | HR 80 | Temp 97.3°F | Ht 70.0 in | Wt 204.2 lb

## 2011-11-19 DIAGNOSIS — C7A00 Malignant carcinoid tumor of unspecified site: Secondary | ICD-10-CM

## 2011-11-19 DIAGNOSIS — C787 Secondary malignant neoplasm of liver and intrahepatic bile duct: Secondary | ICD-10-CM

## 2011-11-19 DIAGNOSIS — D499 Neoplasm of unspecified behavior of unspecified site: Secondary | ICD-10-CM

## 2011-11-19 LAB — CBC WITH DIFFERENTIAL (CANCER CENTER ONLY)
BASO#: 0 10*3/uL (ref 0.0–0.2)
Eosinophils Absolute: 0.3 10*3/uL (ref 0.0–0.5)
HCT: 42.3 % (ref 38.7–49.9)
HGB: 14.2 g/dL (ref 13.0–17.1)
LYMPH#: 1.1 10*3/uL (ref 0.9–3.3)
MCHC: 33.6 g/dL (ref 32.0–35.9)
MONO#: 0.5 10*3/uL (ref 0.1–0.9)
NEUT%: 73.3 % (ref 40.0–80.0)
RBC: 4.92 10*6/uL (ref 4.20–5.70)

## 2011-11-19 MED ORDER — OCTREOTIDE ACETATE 30 MG IM KIT
30.0000 mg | PACK | Freq: Once | INTRAMUSCULAR | Status: DC
  Administered 2011-11-19: 30 mg via INTRAMUSCULAR

## 2011-11-19 NOTE — Progress Notes (Signed)
This office note has been dictated.

## 2011-11-20 NOTE — Progress Notes (Signed)
CC:   Brian Quint. Plotnikov, MD Wilhemina Bonito. Marina Goodell, MD Deveron Furlong, MD  DIAGNOSIS:  Metastatic carcinoid with liver mets.  CURRENT THERAPY:  Sandostatin 30 mg IM q.month.  INTERIM HISTORY:  Brian Mays comes in for followup.  He is doing very well.  He has had no complaints since I last saw him.  He had no problems over the holidays.  He is working without any difficulties.  There is no abdominal pain.  He has no fatigue.  There has been no diarrhea.  He has had no wheezing.  He has had no flushing.  When we last saw him in October, his chromogranin A level was 6.8.  His last PET scan was done back in July of 2012.  Everything looked okay with this.  PHYSICAL EXAMINATION:  General:  This is a well-developed, well- nourished white gentleman in no obvious distress.  Vital signs:  Show a temperature of 97.3, pulse 80, respiratory rate 20, blood pressure 150/94.  Weight is 204.  Head and neck:  Exam shows a normocephalic, atraumatic skull.  There are no ocular or oral lesions.  No palpable cervical or supraclavicular lymph nodes.  Lungs:  Are clear to percussion and auscultation bilaterally.  Cardiac:  Regular rate and rhythm with normal S1, S2.  There are no murmurs, rubs or bruits. Abdomen:  Soft.  Good bowel sounds.  He has a well healing laparotomy wound.  There is no fluid wave.  There is no palpable abdominal mass. No palpable hepatosplenomegaly.  Back:  No tenderness over the spine, ribs or hips.  Extremities:  Show no clubbing, cyanosis or edema. Neurological:  Exam shows no focal neurological deficits.  LABORATORY STUDIES:  Show white cell count 6.8, hemoglobin 14.2, hematocrit 42.3, platelet count 186.  IMPRESSION:  Brian Mays is a 66 year old gentleman with metastatic carcinoid.  He initially was tried for hepatic resection.  This was not amenable.  He received Sandostatin.  He did undergo intrahepatic ytterium-90 therapy.  He did very well with this.  I think that as long as  he is asymptomatic, with normal/stable chromogranin A level, I just do not see that we need to put him through any scans.  Will plan to get him back to see [place] in another 2 or 3 months.  He will continue Sandostatin every month.    ______________________________ Josph Macho, M.D. PRE/MEDQ  D:  11/19/2011  T:  11/20/2011  Job:  [id number]

## 2011-11-22 ENCOUNTER — Telehealth: Payer: Self-pay

## 2011-11-22 NOTE — Telephone Encounter (Signed)
Patient requesting alternative to Creon too expensive, please advise pharmacy is [facility] [address] Call back number is [phone] or [phone].

## 2011-11-22 NOTE — Telephone Encounter (Signed)
Please ask the pharmacy what is less $$ Thx

## 2011-11-23 NOTE — Telephone Encounter (Signed)
Called [facility] [address] And spoke to the pharmacist. He suggested zenpep or pancrelipase stated unsure if less in cost, but exact same drug.

## 2011-11-23 NOTE — Telephone Encounter (Signed)
Ok Zenpep same dose and # for 12 mo Thx

## 2011-11-24 LAB — COMPREHENSIVE METABOLIC PANEL
ALT: 27 U/L (ref 0–53)
AST: 31 U/L (ref 0–37)
Alkaline Phosphatase: 66 U/L (ref 39–117)
CO2: 26 mEq/L (ref 19–32)
Creatinine, Ser: 0.99 mg/dL (ref 0.50–1.35)
Total Bilirubin: 0.5 mg/dL (ref 0.3–1.2)

## 2011-11-24 LAB — LACTATE DEHYDROGENASE: LDH: 199 U/L (ref 94–250)

## 2011-11-24 NOTE — Telephone Encounter (Signed)
Called the pharmacy to fill the Zenpep same dose per MD instructions.

## 2011-12-17 ENCOUNTER — Ambulatory Visit (HOSPITAL_BASED_OUTPATIENT_CLINIC_OR_DEPARTMENT_OTHER): Payer: 59

## 2011-12-17 ENCOUNTER — Ambulatory Visit: Payer: 59 | Admitting: Hematology & Oncology

## 2011-12-17 ENCOUNTER — Ambulatory Visit: Payer: 59

## 2011-12-17 ENCOUNTER — Other Ambulatory Visit: Payer: 59 | Admitting: Lab

## 2011-12-17 VITALS — BP 147/92 | HR 78 | Temp 98.0°F

## 2011-12-17 DIAGNOSIS — D499 Neoplasm of unspecified behavior of unspecified site: Secondary | ICD-10-CM

## 2011-12-17 DIAGNOSIS — C7A00 Malignant carcinoid tumor of unspecified site: Secondary | ICD-10-CM

## 2011-12-17 DIAGNOSIS — C787 Secondary malignant neoplasm of liver and intrahepatic bile duct: Secondary | ICD-10-CM

## 2011-12-17 LAB — COMPREHENSIVE METABOLIC PANEL
Albumin: 4.1 g/dL (ref 3.5–5.2)
Alkaline Phosphatase: 66 U/L (ref 39–117)
BUN: 16 mg/dL (ref 6–23)
Calcium: 9.4 mg/dL (ref 8.4–10.5)
Chloride: 105 mEq/L (ref 96–112)
Glucose, Bld: 90 mg/dL (ref 70–99)
Potassium: 4.2 mEq/L (ref 3.5–5.3)

## 2011-12-17 LAB — CBC WITH DIFFERENTIAL (CANCER CENTER ONLY)
BASO#: 0 10*3/uL (ref 0.0–0.2)
Eosinophils Absolute: 0.4 10*3/uL (ref 0.0–0.5)
HGB: 14.2 g/dL (ref 13.0–17.1)
MCH: 28.7 pg (ref 28.0–33.4)
MONO%: 5.6 % (ref 0.0–13.0)
NEUT#: 4.6 10*3/uL (ref 1.5–6.5)
RBC: 4.94 10*6/uL (ref 4.20–5.70)

## 2011-12-17 MED ORDER — OCTREOTIDE ACETATE 30 MG IM KIT
30.0000 mg | PACK | Freq: Once | INTRAMUSCULAR | Status: AC
  Administered 2011-12-17: 30 mg via INTRAMUSCULAR

## 2012-01-14 ENCOUNTER — Ambulatory Visit (HOSPITAL_BASED_OUTPATIENT_CLINIC_OR_DEPARTMENT_OTHER): Payer: 59 | Admitting: Hematology & Oncology

## 2012-01-14 ENCOUNTER — Other Ambulatory Visit (HOSPITAL_BASED_OUTPATIENT_CLINIC_OR_DEPARTMENT_OTHER): Payer: 59 | Admitting: Lab

## 2012-01-14 ENCOUNTER — Ambulatory Visit (HOSPITAL_BASED_OUTPATIENT_CLINIC_OR_DEPARTMENT_OTHER): Payer: 59

## 2012-01-14 VITALS — BP 137/85 | HR 87 | Temp 97.4°F | Ht 70.0 in | Wt 204.0 lb

## 2012-01-14 DIAGNOSIS — C7A00 Malignant carcinoid tumor of unspecified site: Secondary | ICD-10-CM

## 2012-01-14 DIAGNOSIS — D499 Neoplasm of unspecified behavior of unspecified site: Secondary | ICD-10-CM

## 2012-01-14 DIAGNOSIS — C787 Secondary malignant neoplasm of liver and intrahepatic bile duct: Secondary | ICD-10-CM

## 2012-01-14 LAB — CBC WITH DIFFERENTIAL (CANCER CENTER ONLY)
BASO#: 0 10*3/uL (ref 0.0–0.2)
BASO%: 0.5 % (ref 0.0–2.0)
EOS%: 5.5 % (ref 0.0–7.0)
HCT: 40.9 % (ref 38.7–49.9)
HGB: 13.8 g/dL (ref 13.0–17.1)
LYMPH#: 1.2 10*3/uL (ref 0.9–3.3)
MCHC: 33.7 g/dL (ref 32.0–35.9)
MONO#: 0.5 10*3/uL (ref 0.1–0.9)
NEUT#: 3.7 10*3/uL (ref 1.5–6.5)
NEUT%: 64.4 % (ref 40.0–80.0)
RDW: 14.1 % (ref 11.1–15.7)
WBC: 5.8 10*3/uL (ref 4.0–10.0)

## 2012-01-14 MED ORDER — OCTREOTIDE ACETATE 30 MG IM KIT
30.0000 mg | PACK | Freq: Once | INTRAMUSCULAR | Status: AC
  Administered 2012-01-14: 30 mg via INTRAMUSCULAR

## 2012-01-14 NOTE — Progress Notes (Signed)
This office note has been dictated.

## 2012-01-14 NOTE — Progress Notes (Signed)
CC:   Georgina Quint. Plotnikov, MD Wilhemina Bonito. Marina Goodell, MD Deveron Furlong, MD  DIAGNOSIS:  Metastatic carcinoid with liver metastasis.  CURRENT THERAPY:  Sandostatin 30 mg IM q.month.  INTERIM HISTORY:  Mr. Farias comes in for his followup.  He is doing okay. He says he has had a little more diarrhea.  He thinks, however, this might be from flax seed that he has been taking.  He has had no problems with flushing.  There is no wheezing.  His chromogranin A level back in January was 22.  This does tend to fluctuate a little bit.  He has not noted any problems with headache.  There have been no vision difficulties.  He has had no nausea, vomiting.  PHYSICAL EXAMINATION:  General:  This is a well-developed, well- nourished white gentleman in no obvious distress.  Vital signs:  97.4, pulse 87, respiratory rate 18, blood pressure 137/85.  Weight is 204. Head and neck:  Exam shows a normocephalic, atraumatic skull.  There are no ocular or oral lesions.  No palpable cervical or supraclavicular lymph nodes.  Lungs:  Are clear bilaterally.  Cardiac:  Regular rate and rhythm with a normal S1 and S2.  There are no murmurs, rubs or bruits. Abdomen:  Soft with good bowel sounds.  There is a healed laparotomy scar.  There is no fluid wave.  There is no palpable hepatosplenomegaly. Back:  Shows no tenderness over the spine, ribs or hips.  Extremities: Shows no clubbing, cyanosis or edema.  Neurologic:  Exam shows no focal neurological deficits.  LABORATORY STUDIES:  White cell count 5.8, hemoglobin 13.2, hematocrit 41, platelet count 167.  IMPRESSION:  Mr. Delsanto is a 66 year old gentleman with metastatic carcinoid.  He is doing well.  He did undergo enterohepatic radiotherapy.  He tolerated this well.  I believe this was done a couple years ago.  For now, we will continue to follow him along.  I do not see that we have to do any scans on him for right now as he is pretty much asymptomatic.  We will  continue to have him come in monthly for his Sandostatin.  I will have him come back to see me in another 3 months.    ______________________________ Josph Macho, M.D. PRE/MEDQ  D:  01/14/2012  T:  01/14/2012  Job:  [id number]

## 2012-01-19 LAB — COMPREHENSIVE METABOLIC PANEL
ALT: 24 U/L (ref 0–53)
AST: 29 U/L (ref 0–37)
Albumin: 3.9 g/dL (ref 3.5–5.2)
CO2: 24 mEq/L (ref 19–32)
Calcium: 9.3 mg/dL (ref 8.4–10.5)
Chloride: 105 mEq/L (ref 96–112)
Creatinine, Ser: 0.94 mg/dL (ref 0.50–1.35)
Potassium: 4.2 mEq/L (ref 3.5–5.3)

## 2012-01-19 LAB — CHROMOGRANIN A: Chromogranin A: 5.4 ng/mL (ref 1.9–15.0)

## 2012-02-04 ENCOUNTER — Other Ambulatory Visit: Payer: 59 | Admitting: Lab

## 2012-02-04 ENCOUNTER — Ambulatory Visit: Payer: 59

## 2012-02-11 ENCOUNTER — Ambulatory Visit (HOSPITAL_BASED_OUTPATIENT_CLINIC_OR_DEPARTMENT_OTHER): Payer: MEDICARE

## 2012-02-11 ENCOUNTER — Other Ambulatory Visit (HOSPITAL_BASED_OUTPATIENT_CLINIC_OR_DEPARTMENT_OTHER): Payer: MEDICARE | Admitting: Lab

## 2012-02-11 DIAGNOSIS — C787 Secondary malignant neoplasm of liver and intrahepatic bile duct: Secondary | ICD-10-CM

## 2012-02-11 DIAGNOSIS — D499 Neoplasm of unspecified behavior of unspecified site: Secondary | ICD-10-CM

## 2012-02-11 DIAGNOSIS — C7A00 Malignant carcinoid tumor of unspecified site: Secondary | ICD-10-CM

## 2012-02-11 LAB — CBC WITH DIFFERENTIAL (CANCER CENTER ONLY)
HCT: 38.9 % (ref 38.7–49.9)
LYMPH#: 1.3 10*3/uL (ref 0.9–3.3)
MCHC: 33.9 g/dL (ref 32.0–35.9)
MONO#: 0.5 10*3/uL (ref 0.1–0.9)
NEUT#: 4.4 10*3/uL (ref 1.5–6.5)
NEUT%: 67.3 % (ref 40.0–80.0)
Platelets: 153 10*3/uL (ref 145–400)
RDW: 14 % (ref 11.1–15.7)
WBC: 6.5 10*3/uL (ref 4.0–10.0)

## 2012-02-11 LAB — COMPREHENSIVE METABOLIC PANEL
Albumin: 3.7 g/dL (ref 3.5–5.2)
Alkaline Phosphatase: 59 U/L (ref 39–117)
BUN: 15 mg/dL (ref 6–23)
Glucose, Bld: 98 mg/dL (ref 70–99)
Total Bilirubin: 0.6 mg/dL (ref 0.3–1.2)

## 2012-02-11 MED ORDER — OCTREOTIDE ACETATE 30 MG IM KIT
30.0000 mg | PACK | Freq: Once | INTRAMUSCULAR | Status: AC
  Administered 2012-02-11: 30 mg via INTRAMUSCULAR

## 2012-02-11 NOTE — Patient Instructions (Signed)
Octreotide injection solution What is this medicine? OCTREOTIDE (ok TREE oh tide) is used to reduce flushing and watery diarrhea caused by certain types of cancer. This medicine may be used for other purposes; ask your health care provider or pharmacist if you have questions. What should I tell my health care provider before I take this medicine? They need to know if you have any of these conditions: -gallbladder disease -kidney disease -liver disease -an unusual or allergic reaction to octreotide, other medicines, foods, dyes, or preservatives -pregnant or trying to get pregnant -breast-feeding How should I use this medicine? This medicine is for injection under the skin or into a vein (only in emergency situations). It is usually given by a health care professional in a hospital or clinic setting. If you get this medicine at home, you will be taught how to prepare and give this medicine. Allow the injection solution to come to room temperature before use. Do not warm it artificially. Use exactly as directed. Take your medicine at regular intervals. Do not take your medicine more often than directed. It is important that you put your used needles and syringes in a special sharps container. Do not put them in a trash can. If you do not have a sharps container, call your pharmacist or healthcare provider to get one. Talk to your pediatrician regarding the use of this medicine in children. Special care may be needed. Overdosage: If you think you have taken too much of this medicine contact a poison control center or emergency room at once. NOTE: This medicine is only for you. Do not share this medicine with others. What if I miss a dose? If you miss a dose, take it as soon as you can. If it is almost time for your next dose, take only that dose. Do not take double or extra doses. What may interact with this medicine? Do not take this medicine with any of the following  medications: -cisapride -droperidol -general anesthetics -grepafloxacin -perphenazine -thioridazine This medicine may also interact with the following medications: -bromocriptine -cyclosporine -diuretics -medicines for blood pressure, heart disease, irregular heart beat -medicines for diabetes, including insulin -quinidine This list may not describe all possible interactions. Give your health care provider a list of all the medicines, herbs, non-prescription drugs, or dietary supplements you use. Also tell them if you smoke, drink alcohol, or use illegal drugs. Some items may interact with your medicine. What should I watch for while using this medicine? Visit your doctor or health care professional for regular checks on your progress. To help reduce irritation at the injection site, use a different site for each injection and make sure the solution is at room temperature before use. This medicine may cause increases or decreases in blood sugar. Signs of high blood sugar include frequent urination, unusual thirst, flushed or dry skin, difficulty breathing, drowsiness, stomach ache, nausea, vomiting or dry mouth. Signs of low blood sugar include chills, cool, pale skin or cold sweats, drowsiness, extreme hunger, fast heartbeat, headache, nausea, nervousness or anxiety, shakiness, trembling, unsteadiness, tiredness, or weakness. Contact your doctor or health care professional right away if you experience any of these symptoms. What side effects may I notice from receiving this medicine? Side effects that you should report to your doctor or health care professional as soon as possible: -allergic reactions like skin rash, itching or hives, swelling of the face, lips, or tongue -changes in blood sugar -changes in heart rate -severe stomach pain Side effects that usually do  not require medical attention (report to your doctor or health care professional if they continue or are  bothersome): -diarrhea or constipation -gas or stomach pain -nausea, vomiting -pain, redness, swelling and irritation at site where injected This list may not describe all possible side effects. Call your doctor for medical advice about side effects. You may report side effects to FDA at 1-800-FDA-1088. Where should I keep my medicine? Keep out of the reach of children. Store in a refrigerator between 2 and 8 degrees C (36 and 46 degrees F). Protect from light. Allow to come to room temperature naturally. Do not use artificial heat. If protected from light, the injection may be stored at room temperature between 20 and 30 degrees C (70 and 86 degrees F) for 14 days. After the initial use, throw away any unused portion of a multiple dose vial after 14 days. Throw away unused portions of the ampules after use. NOTE: This sheet is a summary. It may not cover all possible information. If you have questions about this medicine, talk to your doctor, pharmacist, or health care provider.  2012, [employer]. (05/14/2008 4:56:04 PM)

## 2012-02-18 ENCOUNTER — Other Ambulatory Visit: Payer: Self-pay | Admitting: Hematology & Oncology

## 2012-02-18 ENCOUNTER — Other Ambulatory Visit: Payer: Self-pay | Admitting: *Deleted

## 2012-02-18 MED ORDER — HYDROCORTISONE ACETATE 25 MG RE SUPP: 25.0000 mg | Freq: Two times a day (BID) | RECTAL | Status: DC | PRN

## 2012-02-25 ENCOUNTER — Other Ambulatory Visit: Payer: 59 | Admitting: Lab

## 2012-02-25 ENCOUNTER — Ambulatory Visit: Payer: 59

## 2012-03-10 ENCOUNTER — Ambulatory Visit (HOSPITAL_BASED_OUTPATIENT_CLINIC_OR_DEPARTMENT_OTHER): Payer: Medicare Other

## 2012-03-10 ENCOUNTER — Other Ambulatory Visit (HOSPITAL_BASED_OUTPATIENT_CLINIC_OR_DEPARTMENT_OTHER): Payer: Medicare Other | Admitting: Lab

## 2012-03-10 VITALS — BP 153/87 | HR 74 | Temp 98.7°F

## 2012-03-10 DIAGNOSIS — C7A00 Malignant carcinoid tumor of unspecified site: Secondary | ICD-10-CM

## 2012-03-10 DIAGNOSIS — C787 Secondary malignant neoplasm of liver and intrahepatic bile duct: Secondary | ICD-10-CM

## 2012-03-10 DIAGNOSIS — D499 Neoplasm of unspecified behavior of unspecified site: Secondary | ICD-10-CM

## 2012-03-10 LAB — CBC WITH DIFFERENTIAL (CANCER CENTER ONLY)
BASO#: 0 10*3/uL (ref 0.0–0.2)
Eosinophils Absolute: 0.4 10*3/uL (ref 0.0–0.5)
HCT: 41 % (ref 38.7–49.9)
HGB: 13.7 g/dL (ref 13.0–17.1)
LYMPH#: 1.1 10*3/uL (ref 0.9–3.3)
MONO#: 0.3 10*3/uL (ref 0.1–0.9)
NEUT#: 4.1 10*3/uL (ref 1.5–6.5)
NEUT%: 69.2 % (ref 40.0–80.0)
RBC: 4.69 10*6/uL (ref 4.20–5.70)
WBC: 5.9 10*3/uL (ref 4.0–10.0)

## 2012-03-10 MED ORDER — OCTREOTIDE ACETATE 30 MG IM KIT
30.0000 mg | PACK | Freq: Once | INTRAMUSCULAR | Status: AC
  Administered 2012-03-10: 30 mg via INTRAMUSCULAR

## 2012-03-10 NOTE — Patient Instructions (Signed)

## 2012-03-15 LAB — COMPREHENSIVE METABOLIC PANEL
AST: 23 U/L (ref 0–37)
Albumin: 3.7 g/dL (ref 3.5–5.2)
BUN: 17 mg/dL (ref 6–23)
CO2: 29 mEq/L (ref 19–32)
Calcium: 8.9 mg/dL (ref 8.4–10.5)
Chloride: 104 mEq/L (ref 96–112)
Creatinine, Ser: 0.97 mg/dL (ref 0.50–1.35)
Potassium: 3.9 mEq/L (ref 3.5–5.3)

## 2012-03-15 LAB — CHROMOGRANIN A: Chromogranin A: 3.2 ng/mL (ref 1.9–15.0)

## 2012-03-15 LAB — LACTATE DEHYDROGENASE: LDH: 162 U/L (ref 94–250)

## 2012-03-17 ENCOUNTER — Ambulatory Visit: Payer: 59

## 2012-03-17 ENCOUNTER — Other Ambulatory Visit: Payer: 59 | Admitting: Lab

## 2012-03-23 ENCOUNTER — Telehealth: Payer: Self-pay | Admitting: *Deleted

## 2012-03-23 NOTE — Telephone Encounter (Signed)
Called patient to let him know that his labwork  Looked great per dr. Myna Hidalgo

## 2012-03-23 NOTE — Telephone Encounter (Signed)
Message copied by Anselm Jungling on Thu Mar 23, 2012 11:03 AM ------      Message from: Arlan Organ R      Created: Mon Mar 20, 2012  6:02 PM       Call - labs look great!!!!  Cindee Lame

## 2012-03-29 ENCOUNTER — Encounter: Payer: Self-pay | Admitting: Internal Medicine

## 2012-03-29 ENCOUNTER — Ambulatory Visit (INDEPENDENT_AMBULATORY_CARE_PROVIDER_SITE_OTHER): Payer: Medicare Other | Admitting: Internal Medicine

## 2012-03-29 ENCOUNTER — Other Ambulatory Visit (INDEPENDENT_AMBULATORY_CARE_PROVIDER_SITE_OTHER): Payer: Medicare Other

## 2012-03-29 VITALS — BP 104/52 | HR 80 | Temp 97.5°F | Ht 70.5 in | Wt 201.2 lb

## 2012-03-29 DIAGNOSIS — R197 Diarrhea, unspecified: Secondary | ICD-10-CM

## 2012-03-29 DIAGNOSIS — K219 Gastro-esophageal reflux disease without esophagitis: Secondary | ICD-10-CM

## 2012-03-29 DIAGNOSIS — I10 Essential (primary) hypertension: Secondary | ICD-10-CM

## 2012-03-29 LAB — CBC WITH DIFFERENTIAL/PLATELET
Basophils Relative: 0.5 % (ref 0.0–3.0)
Eosinophils Relative: 0.2 % (ref 0.0–5.0)
HCT: 43 % (ref 39.0–52.0)
Lymphs Abs: 1.2 10*3/uL (ref 0.7–4.0)
MCV: 86.6 fl (ref 78.0–100.0)
Monocytes Absolute: 1 10*3/uL (ref 0.1–1.0)
Monocytes Relative: 10.8 % (ref 3.0–12.0)
Platelets: 179 10*3/uL (ref 150.0–400.0)
RBC: 4.96 Mil/uL (ref 4.22–5.81)
WBC: 9.2 10*3/uL (ref 4.5–10.5)

## 2012-03-29 LAB — BASIC METABOLIC PANEL
BUN: 15 mg/dL (ref 6–23)
CO2: 28 mEq/L (ref 19–32)
Calcium: 9.1 mg/dL (ref 8.4–10.5)
Glucose, Bld: 99 mg/dL (ref 70–99)
Potassium: 4.5 mEq/L (ref 3.5–5.1)
Sodium: 138 mEq/L (ref 135–145)

## 2012-03-29 LAB — HEPATIC FUNCTION PANEL
AST: 39 U/L — ABNORMAL HIGH (ref 0–37)
Albumin: 3.9 g/dL (ref 3.5–5.2)
Alkaline Phosphatase: 62 U/L (ref 39–117)
Total Protein: 8.3 g/dL (ref 6.0–8.3)

## 2012-03-29 MED ORDER — DIPHENOXYLATE-ATROPINE 2.5-0.025 MG PO TABS: 1.0000 | ORAL_TABLET | Freq: Four times a day (QID) | ORAL | Status: AC | PRN

## 2012-03-29 NOTE — Assessment & Plan Note (Signed)
stable overall by hx and exam, most recent data reviewed with pt, and pt to continue medical treatment as before, except to consider holding losartan for worsening symtpoms such as weakness, dizziness BP Readings from Last 3 Encounters:  03/29/12 104/52  03/10/12 153/87  01/14/12 137/85

## 2012-03-29 NOTE — Patient Instructions (Signed)
Take all new medications as prescribed Continue all other medications as before Please go to LAB in the Basement for the blood and/or urine tests to be done today You will be contacted by phone if any changes need to be made immediately.  Otherwise, you will receive a letter about your results with an explanation. Please drink more fluids as long as the diarrhea is going on, but should improve in the next 5-7 days You can hold on taking the losartan if you become weaker or dizzy during the diarrhea as your blood pressure if borderline low

## 2012-03-29 NOTE — Progress Notes (Signed)
Subjective:    Patient ID: Brian Mays, male    DOB: 20-Mar-1946, 66 y.o.   MRN: [id number]  HPI  Here with 2-3 days onset illness with low grade fever, fatigue, gas feeling, belching and increased freq of loose and watery stool, with mult episodes last pm so decided to come in today;  Has no chills, rash, swelling, n/v, abd pain, bleeding/blood loss, rash.  Grandchild was ill last wk and he did travel 2 days prior to onset of illness out state on business.  Has some minor myalgias to neck, upper back and shoulders.  Pt denies chest pain, increased sob or doe, wheezing, orthopnea, PND, increased LE swelling, palpitations, dizziness or syncope.  Pt denies new neurological symptoms such as new headache, or facial or extremity weakness or numbness   Pt denies polydipsia, polyuria.   Denies urinary symptoms such as dysuria, frequency, urgency,or hematuria. Denies worsening reflux, dysphagia. Past Medical History  Diagnosis Date  . GERD (gastroesophageal reflux disease)   . Pancreatitis 1998    chronic  . HTN (hypertension)   . Hemorrhoid   . Hypothyroidism   . Colon polyps   . Metastatic carcinoma 2010    Dr Twanna Hy   Past Surgical History  Procedure Date  . Cholecystectomy   . Carcinoil resection 01/2009    reports that he has quit smoking. He does not have any smokeless tobacco history on file. He reports that he does not drink alcohol or use illicit drugs. family history includes COPD in his father; Hyperlipidemia in his mother; Hypertension in his mother and other; and Kidney disease in his mother. Allergies  Allergen Reactions  . Iohexol      Code: HIVES, Desc: PER [name] @ [facility], PT IS ALLERGIC TO CONTRAST DYE 10/02/08/RM  05/01/10...needs full premeds per our protocol w/ [facility]., Onset Date: [id number]    Current Outpatient Prescriptions on File Prior to Visit  Medication Sig Dispense Refill  . aspirin 81 MG tablet Take 81 mg by mouth daily.      [name] BROMDAY 0.09 % SOLN         . Cholecalciferol 1000 UNITS tablet Take 1,000 Units by mouth daily.        . clobetasol (OLUX) 0.05 % topical foam Apply topically 2 (two) times daily.  100 g  0  . Cyanocobalamin (VITAMIN B-12) 1000 MCG SUBL Place 1 tablet (1,000 mcg total) under the tongue daily.  100 tablet  3  . hydrocortisone (ANUSOL-HC) 25 MG suppository Place 1 suppository (25 mg total) rectally 2 (two) times daily as needed for hemorrhoids.  30 suppository  1  . lansoprazole (PREVACID) 15 MG capsule Take 15 mg by mouth daily.        [name] levothyroxine (SYNTHROID, LEVOTHROID) 50 MCG tablet TAKE 1 TABLET BY MOUTH EVERY DAY  90 tablet  3  . losartan (COZAAR) 100 MG tablet Take 1 tablet (100 mg total) by mouth daily.  90 tablet  3  . LOTEMAX 0.5 % ophthalmic suspension       . metoCLOPramide (REGLAN) 10 MG tablet Take 10 mg by mouth 3 (three) times daily as needed.        [name] octreotide (SANDOSTATIN LAR) 30 MG injection Inject 30 mg into the muscle every 28 (twenty-eight) days.        . Pancrelipase, Lip-Prot-Amyl, (CREON) 24000 UNITS CPEP Take 1 capsule (24,000 Units total) by mouth 3 (three) times daily with meals.  90 capsule  3  . promethazine (  PHENERGAN) 25 MG tablet Take 25-50 mg by mouth 4 (four) times daily as needed.        . triamcinolone (KENALOG) 0.5 % cream APPLY TWICE A DAY AS NEEDED  120 g  1  . triamcinolone ointment (KENALOG) 0.5 % Apply topically 2 (two) times daily.  30 g  0  . DISCONTD: hydrocortisone (ANUSOL-HC) 25 MG suppository Place 1 suppository (25 mg total) rectally 2 (two) times daily.  20 suppository  2    Review of Systems Review of Systems  Constitutional: Negative for diaphoresis and unexpected weight change.   Respiratory: Negative for choking and stridor.   Gastrointestinal: Negative for vomiting and blood in stool.  Genitourinary: Negative for hematuria and decreased urine volume.  Musculoskeletal: Negative for gait problem.  Skin: Negative for color change and wound.  Neurological:  Negative for tremors and numbness.  Psychiatric/Behavioral: Negative for decreased concentration. The patient is not hyperactive.      Objective:   Physical Exam BP 104/52  Pulse 80  Temp(Src) 97.5 F (36.4 C) (Oral)  Ht 5' 10.5" (1.791 m)  Wt 201 lb 4 oz (91.286 kg)  BMI 28.47 kg/m2  SpO2 94% Physical Exam  VS noted, non toxic Constitutional: Pt appears well-developed and well-nourished.  HENT: Head: Normocephalic.  Right Ear: External ear normal.  Left Ear: External ear normal.  Bilat tm's mild erythema.  Sinus nontender.  Pharynx mild erythema Eyes: Conjunctivae and EOM are normal. Pupils are equal, round, and reactive to light.  Neck: Normal range of motion. Neck supple.  Cardiovascular: Normal rate and regular rhythm.   Pulmonary/Chest: Effort normal and breath sounds normal.  Abd:  Soft, NT, non-distended, + BS - benign exam Neurological: Pt is alert. Not confused Skin: Skin is warm. No erythema. No rash Psychiatric: Pt behavior is normal. Thought content normal.     Assessment & Plan:

## 2012-03-29 NOTE — Assessment & Plan Note (Signed)
stable overall by hx and exam, , and pt to continue medical treatment as before   

## 2012-03-29 NOTE — Assessment & Plan Note (Signed)
Exam c/w mild viral illness most likely, exam benign, afeb now and no overt bleeding, to check labs including wbc, ok for fluids, tylenol, lomotil prn, but consider antibx for elev wbc, fever, chills, blood or pain

## 2012-03-30 ENCOUNTER — Encounter: Payer: Self-pay | Admitting: Internal Medicine

## 2012-04-03 ENCOUNTER — Ambulatory Visit (INDEPENDENT_AMBULATORY_CARE_PROVIDER_SITE_OTHER): Payer: Medicare Other | Admitting: Internal Medicine

## 2012-04-03 ENCOUNTER — Encounter: Payer: Self-pay | Admitting: Internal Medicine

## 2012-04-03 ENCOUNTER — Other Ambulatory Visit (INDEPENDENT_AMBULATORY_CARE_PROVIDER_SITE_OTHER): Payer: Medicare Other

## 2012-04-03 VITALS — BP 112/70 | HR 80 | Temp 98.1°F | Resp 16 | Ht 70.5 in | Wt 198.0 lb

## 2012-04-03 DIAGNOSIS — R7309 Other abnormal glucose: Secondary | ICD-10-CM

## 2012-04-03 DIAGNOSIS — Z0000 Encounter for general adult medical examination without abnormal findings: Secondary | ICD-10-CM

## 2012-04-03 DIAGNOSIS — E538 Deficiency of other specified B group vitamins: Secondary | ICD-10-CM

## 2012-04-03 DIAGNOSIS — J019 Acute sinusitis, unspecified: Secondary | ICD-10-CM

## 2012-04-03 DIAGNOSIS — I10 Essential (primary) hypertension: Secondary | ICD-10-CM

## 2012-04-03 DIAGNOSIS — Z136 Encounter for screening for cardiovascular disorders: Secondary | ICD-10-CM

## 2012-04-03 DIAGNOSIS — Z125 Encounter for screening for malignant neoplasm of prostate: Secondary | ICD-10-CM

## 2012-04-03 DIAGNOSIS — R739 Hyperglycemia, unspecified: Secondary | ICD-10-CM

## 2012-04-03 DIAGNOSIS — N320 Bladder-neck obstruction: Secondary | ICD-10-CM

## 2012-04-03 DIAGNOSIS — Z79899 Other long term (current) drug therapy: Secondary | ICD-10-CM

## 2012-04-03 LAB — URINALYSIS, ROUTINE W REFLEX MICROSCOPIC
Bilirubin Urine: NEGATIVE
Leukocytes, UA: NEGATIVE
Nitrite: NEGATIVE
Total Protein, Urine: NEGATIVE
Urobilinogen, UA: 0.2 (ref 0.0–1.0)

## 2012-04-03 LAB — LIPID PANEL
HDL: 26.8 mg/dL — ABNORMAL LOW (ref >39.00)
LDL Cholesterol: 76 mg/dL (ref 0–99)
Total CHOL/HDL Ratio: 5
VLDL: 20.2 mg/dL (ref 0.0–40.0)

## 2012-04-03 LAB — VITAMIN B12: Vitamin B-12: 863 pg/mL (ref 211–911)

## 2012-04-03 MED ORDER — AZITHROMYCIN 250 MG PO TABS: ORAL_TABLET | ORAL | Status: DC

## 2012-04-03 NOTE — Progress Notes (Signed)
Patient ID: Brian Mays, male   DOB: 1946-06-14, 66 y.o.   MRN: [id number]  Subjective:    Patient ID: Brian Mays, male    DOB: [birth year], 66 y.o.   MRN: [id number]  HPI The patient presents for a follow-up of  chronic hypertension, B12 def and hypothyroidism, GERD controlled with medicines. He had diarrhea last wk - resolved. C/o sinusitis x 2 wks The patient is here for a [facility] wellness exam. The patient has been doing well overall without major physical or psychological issues going on lately.   Wt Readings from Last 3 Encounters:  04/03/12 198 lb (89.812 kg)  03/29/12 201 lb 4 oz (91.286 kg)  01/14/12 204 lb (92.534 kg)   BP Readings from Last 3 Encounters:  04/03/12 112/70  03/29/12 104/52  03/10/12 153/87     Review of Systems  Constitutional: Positive for fatigue. Negative for appetite change and unexpected weight change.  HENT: Positive for rhinorrhea and postnasal drip. Negative for hearing loss, nosebleeds, congestion, sore throat, sneezing, trouble swallowing and neck pain.   Eyes: Negative for pain, itching and visual disturbance.  Respiratory: Negative for cough.   Cardiovascular: Negative for chest pain, palpitations and leg swelling.  Gastrointestinal: Negative for nausea, diarrhea, blood in stool and abdominal distention.  Genitourinary: Negative for frequency and hematuria.  Musculoskeletal: Negative for back pain, joint swelling and gait problem.  Skin: Negative for rash.  Neurological: Negative for dizziness, tremors, speech difficulty and weakness.  Psychiatric/Behavioral: Negative for suicidal ideas, behavioral problems, sleep disturbance, dysphoric mood and agitation. The patient is not nervous/anxious.         Objective:   Physical Exam  Constitutional: He is oriented to person, place, and time. He appears well-developed. No distress.  HENT:  Mouth/Throat: Oropharynx is clear and moist.  Eyes: Conjunctivae are normal. Pupils are equal, round, and  reactive to light.  Neck: Normal range of motion. No JVD present. No thyromegaly present.  Cardiovascular: Normal rate, regular rhythm, normal heart sounds and intact distal pulses.  Exam reveals no gallop and no friction rub.   No murmur heard. Pulmonary/Chest: Effort normal and breath sounds normal. No respiratory distress. He has no wheezes. He has no rales. He exhibits no tenderness.  Abdominal: Soft. Bowel sounds are normal. He exhibits no distension and no mass. There is no tenderness. There is no rebound and no guarding.  Genitourinary: Guaiac negative stool. No penile tenderness.  Musculoskeletal: Normal range of motion. He exhibits no edema and no tenderness.  Lymphadenopathy:    He has no cervical adenopathy.  Neurological: He is alert and oriented to person, place, and time. He has normal reflexes. No cranial nerve deficit. He exhibits normal muscle tone. Coordination normal.  Skin: Skin is warm and dry. No rash noted.  Psychiatric: He has a normal mood and affect. His behavior is normal. Judgment and thought content normal.    Lab Results  Component Value Date   WBC 9.2 03/29/2012   HGB 14.4 03/29/2012   HCT 43.0 03/29/2012   PLT 179.0 03/29/2012   GLUCOSE 99 03/29/2012   CHOL 139 06/16/2009   TRIG 137.0 06/16/2009   HDL 29.70* 06/16/2009   LDLDIRECT 147.6 12/08/2007   LDLCALC 82 06/16/2009   ALT 20 03/29/2012   AST 39* 03/29/2012   NA 138 03/29/2012   K 4.5 03/29/2012   CL 101 03/29/2012   CREATININE 1.2 03/29/2012   BUN 15 03/29/2012   CO2 28 03/29/2012   TSH 3.73 04/19/2011  PSA 0.63 04/19/2011   INR 1.00 05/26/2010   HGBA1C 5.9 04/19/2011         Assessment & Plan:

## 2012-04-03 NOTE — Assessment & Plan Note (Signed)
The patient is here for annual Medicare wellness examination and management of other chronic and acute problems.   The risk factors are reflected in the social history.  The roster of all physicians providing medical care to patient - is listed in the Snapshot section of the chart.  Activities of daily living:  The patient is 100% inedpendent in all ADLs: dressing, toileting, feeding as well as independent mobility  Home safety : The patient has smoke detectors in the home. They wear seatbelts. There is no violence in the home.   There is no risks for hepatitis, STDs or HIV. There is no   history of blood transfusion. They have no travel history to infectious disease endemic areas of the world.  The patient has  seen their dentist in the last 12 month. They have  seen their eye doctor in the last year. They deny  Any major hearing difficulty and have not had audiologic testing in the last year.  They do not  have excessive sun exposure. Discussed the need for sun protection: hats, long sleeves and use of sunscreen if there is significant sun exposure.   Diet: the importance of a healthy diet is discussed. They do have a reasonably healthy  diet.  The patient has a fairly regular exercise program of a mixed nature: walking, yard work, etc.The benefits of regular aerobic exercise were discussed.  Depression screen: there are no signs or vegative symptoms of depression- irritability, change in appetite, anhedonia, sadness/tearfullness.  Cognitive assessment: the patient manages all their financial and personal affairs and is actively engaged. They could relate day,date,year and events; recalled 3/3 objects at 3 minutes  The following portions of the patient's history were reviewed and updated as appropriate: allergies, current medications, past family history, past medical history,  past surgical history, past social history  and problem list.  Vision, hearing, body mass index were assessed and  reviewed.   During the course of the visit the patient was educated and counseled about appropriate screening and preventive services including : fall prevention , diabetes screening, nutrition counseling, colorectal cancer screening, and recommended immunizations.  

## 2012-04-03 NOTE — Assessment & Plan Note (Signed)
z pac 

## 2012-04-03 NOTE — Assessment & Plan Note (Signed)
Check labs 

## 2012-04-06 ENCOUNTER — Ambulatory Visit (HOSPITAL_BASED_OUTPATIENT_CLINIC_OR_DEPARTMENT_OTHER): Payer: Medicare Other | Admitting: Hematology & Oncology

## 2012-04-06 ENCOUNTER — Ambulatory Visit (HOSPITAL_BASED_OUTPATIENT_CLINIC_OR_DEPARTMENT_OTHER): Payer: Medicare Other

## 2012-04-06 ENCOUNTER — Other Ambulatory Visit (HOSPITAL_BASED_OUTPATIENT_CLINIC_OR_DEPARTMENT_OTHER): Payer: Medicare Other | Admitting: Lab

## 2012-04-06 VITALS — BP 124/78 | HR 69 | Temp 98.6°F | Ht 70.0 in | Wt 201.0 lb

## 2012-04-06 DIAGNOSIS — K649 Unspecified hemorrhoids: Secondary | ICD-10-CM

## 2012-04-06 DIAGNOSIS — C7A00 Malignant carcinoid tumor of unspecified site: Secondary | ICD-10-CM

## 2012-04-06 DIAGNOSIS — E538 Deficiency of other specified B group vitamins: Secondary | ICD-10-CM

## 2012-04-06 DIAGNOSIS — C787 Secondary malignant neoplasm of liver and intrahepatic bile duct: Secondary | ICD-10-CM

## 2012-04-06 DIAGNOSIS — D499 Neoplasm of unspecified behavior of unspecified site: Secondary | ICD-10-CM

## 2012-04-06 DIAGNOSIS — D3A00 Benign carcinoid tumor of unspecified site: Secondary | ICD-10-CM

## 2012-04-06 DIAGNOSIS — I10 Essential (primary) hypertension: Secondary | ICD-10-CM

## 2012-04-06 LAB — CBC WITH DIFFERENTIAL (CANCER CENTER ONLY)
BASO%: 0.5 % (ref 0.0–2.0)
EOS%: 3.6 % (ref 0.0–7.0)
Eosinophils Absolute: 0.2 10*3/uL (ref 0.0–0.5)
LYMPH%: 18 % (ref 14.0–48.0)
MCH: 29.3 pg (ref 28.0–33.4)
MCV: 86 fL (ref 82–98)
MONO%: 11.4 % (ref 0.0–13.0)
NEUT#: 4.4 10*3/uL (ref 1.5–6.5)
Platelets: 218 10*3/uL (ref 145–400)
RBC: 4.47 10*6/uL (ref 4.20–5.70)
RDW: 13.3 % (ref 11.1–15.7)

## 2012-04-06 LAB — COMPREHENSIVE METABOLIC PANEL
ALT: 20 U/L (ref 0–53)
AST: 24 U/L (ref 0–37)
Albumin: 3.8 g/dL (ref 3.5–5.2)
Alkaline Phosphatase: 63 U/L (ref 39–117)
Glucose, Bld: 99 mg/dL (ref 70–99)
Potassium: 4.6 mEq/L (ref 3.5–5.3)
Sodium: 138 mEq/L (ref 135–145)
Total Bilirubin: 0.5 mg/dL (ref 0.3–1.2)
Total Protein: 7.2 g/dL (ref 6.0–8.3)

## 2012-04-06 MED ORDER — OCTREOTIDE ACETATE 30 MG IM KIT
30.0000 mg | PACK | Freq: Once | INTRAMUSCULAR | Status: AC
  Administered 2012-04-06: 30 mg via INTRAMUSCULAR

## 2012-04-06 NOTE — Progress Notes (Signed)
This office note has been dictated.

## 2012-04-07 NOTE — Progress Notes (Signed)
CC:   Brian Quint. Plotnikov, MD Wilhemina Bonito. Marina Goodell, MD Deveron Furlong, MD  DIAGNOSIS:  Metastatic carcinoid-liver mets.  CURRENT THERAPY:  Sandostatin 30 mg IM every month.  INTERIM HISTORY:  Brian Mays comes in for his followup.  He is doing all right.  He is still working.  He has had no problems with abdominal pain.  He says that he has had a little more diarrhea the past week.  He has had no flushing.  There has been no wheezing.  He is not playing golf like he would like.  His chromogranin A level when we last checked it was 3.2 back in May.  He has had no problems with his urine.  He has had no fever.  He has had no bony pain.  PHYSICAL EXAM:  General: This is a well-developed, well-nourished white gentleman in no obvious distress.  Vital Signs:  Show temperature 98.6, pulse 69, respiratory rate 18, blood pressure 124/78.  Weight is 201. Head and neck exam:  Shows a normocephalic, atraumatic skull.  There are no ocular or oral lesions.  He has no facial erythema.  There is no adenopathy in his neck.  Lungs:  Clear bilaterally.  Cardiac examination:  Regular rate and rhythm with a normal S1, S2.  There are no murmurs, rubs or bruits.  Abdominal exam:  Soft with good bowel sounds.  There is no palpable abdominal mass.  There is no palpable hepatosplenomegaly.  He has well-healed laparotomy scar.  Back exam:  No tenderness over the spine, ribs, or hips.  Extremities:  Show no clubbing, cyanosis or edema.  Neurological exam:  Shows no focal neurological deficits.  Skin:  No rashes, ecchymoses or petechia.  LABORATORY STUDIES:  Show a white cell count 6.6, hemoglobin 13, hematocrit 39, platelet count 218.  His vitamin B12 level is 863.  His other liver function tests are normal.  IMPRESSION:  Brian Mays is a 66 year old gentleman with metastatic carcinoid with liver mets.  He did undergo yttrium-90 therapy back in July 2011.  So far all this has helped quite a bit.  His last scans  that we did on him actually were last year.  So far, we do not seen any evidence of progression.  His chromogranin A level is usually a good marker for disease progression.  For now, we will plan to get him back in another couple months to see me.  He will come back next month for his Sandostatin therapy.    ______________________________ Josph Macho, M.D. PRE/MEDQ  D:  04/06/2012  T:  04/07/2012  Job:  [id number]

## 2012-05-05 ENCOUNTER — Ambulatory Visit (HOSPITAL_BASED_OUTPATIENT_CLINIC_OR_DEPARTMENT_OTHER): Payer: Medicare Other

## 2012-05-05 ENCOUNTER — Other Ambulatory Visit: Payer: Medicare Other | Admitting: Lab

## 2012-05-05 VITALS — BP 150/92 | HR 63 | Temp 97.0°F

## 2012-05-05 DIAGNOSIS — C7A00 Malignant carcinoid tumor of unspecified site: Secondary | ICD-10-CM

## 2012-05-05 DIAGNOSIS — D499 Neoplasm of unspecified behavior of unspecified site: Secondary | ICD-10-CM

## 2012-05-05 DIAGNOSIS — C787 Secondary malignant neoplasm of liver and intrahepatic bile duct: Secondary | ICD-10-CM

## 2012-05-05 MED ORDER — OCTREOTIDE ACETATE 30 MG IM KIT
30.0000 mg | PACK | Freq: Once | INTRAMUSCULAR | Status: AC
  Administered 2012-05-05: 30 mg via INTRAMUSCULAR

## 2012-05-05 NOTE — Patient Instructions (Signed)

## 2012-06-02 ENCOUNTER — Ambulatory Visit (HOSPITAL_BASED_OUTPATIENT_CLINIC_OR_DEPARTMENT_OTHER): Payer: Medicare Other

## 2012-06-02 ENCOUNTER — Telehealth: Payer: Self-pay | Admitting: Hematology & Oncology

## 2012-06-02 ENCOUNTER — Ambulatory Visit (HOSPITAL_BASED_OUTPATIENT_CLINIC_OR_DEPARTMENT_OTHER): Payer: Medicare Other | Admitting: Hematology & Oncology

## 2012-06-02 ENCOUNTER — Other Ambulatory Visit (HOSPITAL_BASED_OUTPATIENT_CLINIC_OR_DEPARTMENT_OTHER): Payer: Medicare Other | Admitting: Lab

## 2012-06-02 VITALS — BP 133/85 | HR 79 | Temp 97.2°F | Resp 16 | Wt 200.0 lb

## 2012-06-02 DIAGNOSIS — C7A00 Malignant carcinoid tumor of unspecified site: Secondary | ICD-10-CM

## 2012-06-02 DIAGNOSIS — D3A00 Benign carcinoid tumor of unspecified site: Secondary | ICD-10-CM

## 2012-06-02 DIAGNOSIS — D3A098 Benign carcinoid tumors of other sites: Secondary | ICD-10-CM

## 2012-06-02 DIAGNOSIS — C787 Secondary malignant neoplasm of liver and intrahepatic bile duct: Secondary | ICD-10-CM

## 2012-06-02 LAB — CBC WITH DIFFERENTIAL (CANCER CENTER ONLY)
BASO#: 0 10*3/uL (ref 0.0–0.2)
Eosinophils Absolute: 0.3 10*3/uL (ref 0.0–0.5)
HGB: 14.1 g/dL (ref 13.0–17.1)
LYMPH#: 1.4 10*3/uL (ref 0.9–3.3)
MCH: 29.1 pg (ref 28.0–33.4)
MONO#: 0.4 10*3/uL (ref 0.1–0.9)
MONO%: 7 % (ref 0.0–13.0)
NEUT#: 4.1 10*3/uL (ref 1.5–6.5)
Platelets: 175 10*3/uL (ref 145–400)
RBC: 4.85 10*6/uL (ref 4.20–5.70)
WBC: 6.3 10*3/uL (ref 4.0–10.0)

## 2012-06-02 MED ORDER — OCTREOTIDE ACETATE 30 MG IM KIT
30.0000 mg | PACK | Freq: Once | INTRAMUSCULAR | Status: AC
  Administered 2012-06-02: 30 mg via INTRAMUSCULAR

## 2012-06-02 NOTE — Progress Notes (Signed)
This office note has been dictated.

## 2012-06-02 NOTE — Telephone Encounter (Signed)
Patient called and change appt time on his Oct 4th appt to 1pm

## 2012-06-03 NOTE — Progress Notes (Signed)
CC:   Georgina Quint. Plotnikov, MD Deveron Furlong, MD Wilhemina Bonito. Marina Goodell, MD  DIAGNOSIS:  Metastatic carcinoid-liver metastasis.  CURRENT THERAPY:  Sandostatin 30 mg IM q.month.  INTERIM HISTORY:  Mr. Mckeehan comes in for followup.  We see him every few months.  He comes in monthly for his Sandostatin.  He has had no problem with bowels or bladder.  He has had no cough or shortness of breath.  There has been no wheezing.  He is still working. He is enjoying his granddaughter who is now a little over a year old.  His last chromogranin A level was 3.2 back in May.  His last PSA drawn back in June was 0.55.  PHYSICAL EXAMINATION:  General:  This is a well-developed, well- nourished white gentleman in no obvious distress.  Vital signs: Temperature of 97.2, pulse 79, respiratory rate 16, blood pressure 133/85.  Weight 200 pounds.  Head and neck:  Normocephalic, atraumatic skull.  There are no ocular or oral lesions.  There are no palpable cervical or supraclavicular lymph nodes.  Lungs:  Clear bilaterally. Cardiac:  Regular rate and rhythm with a normal S1 and S2.  There are no murmurs, rubs or bruits.  Abdomen:  Soft with good bowel sounds.  There is a well-healed laparotomy scar.  There is no fluid wave.  There is no abdominal mass.  There is no palpable hepatosplenomegaly.  Back:  No tenderness over the spine, ribs or hips.  Extremities:  Shows no clubbing, cyanosis or edema.  Skin:  No rashes, ecchymoses or petechiae.  LABORATORY STUDIES:  White cell count is 6.3, hemoglobin 14.1, hematocrit 42.4, platelet count 175.  IMPRESSION:  Mr. Givler is a 66 year old gentleman with history of metastatic carcinoid.  He appears asymptomatic.  I do not see any evidence of tumor progression.  We have not scanned him in a year and a half.  Again, he has been asymptomatic.  I do not think that we need to put him through scans.  He did have intrahepatic therapy.  He had yttrium 90 therapy back in July  2011.  For now, we will just continue to follow his chromogranin A level.  If we start seeing changes with this, then we can reexamine him with radiological studies.  We will plan to see Mr. Felmlee back monthly for Sandostatin.  I will see him back myself in about 2-3 months.    ______________________________ Josph Macho, M.D. PRE/MEDQ  D:  06/02/2012  T:  06/03/2012  Job:  [id number]

## 2012-06-06 LAB — COMPREHENSIVE METABOLIC PANEL
Albumin: 4 g/dL (ref 3.5–5.2)
BUN: 14 mg/dL (ref 6–23)
CO2: 24 mEq/L (ref 19–32)
Calcium: 9.2 mg/dL (ref 8.4–10.5)
Chloride: 104 mEq/L (ref 96–112)
Glucose, Bld: 126 mg/dL — ABNORMAL HIGH (ref 70–99)
Potassium: 4.3 mEq/L (ref 3.5–5.3)
Sodium: 141 mEq/L (ref 135–145)
Total Protein: 7.2 g/dL (ref 6.0–8.3)

## 2012-06-06 LAB — LACTATE DEHYDROGENASE: LDH: 169 U/L (ref 94–250)

## 2012-07-07 ENCOUNTER — Ambulatory Visit (HOSPITAL_BASED_OUTPATIENT_CLINIC_OR_DEPARTMENT_OTHER): Payer: Medicare Other

## 2012-07-07 VITALS — BP 155/92 | HR 66 | Temp 97.0°F | Resp 20

## 2012-07-07 DIAGNOSIS — D499 Neoplasm of unspecified behavior of unspecified site: Secondary | ICD-10-CM

## 2012-07-07 DIAGNOSIS — C787 Secondary malignant neoplasm of liver and intrahepatic bile duct: Secondary | ICD-10-CM

## 2012-07-07 DIAGNOSIS — C7A00 Malignant carcinoid tumor of unspecified site: Secondary | ICD-10-CM

## 2012-07-07 MED ORDER — OCTREOTIDE ACETATE 30 MG IM KIT
30.0000 mg | PACK | Freq: Once | INTRAMUSCULAR | Status: AC
  Administered 2012-07-07: 30 mg via INTRAMUSCULAR

## 2012-07-07 NOTE — Patient Instructions (Signed)

## 2012-07-22 ENCOUNTER — Other Ambulatory Visit: Payer: Self-pay | Admitting: Internal Medicine

## 2012-07-24 ENCOUNTER — Other Ambulatory Visit: Payer: Self-pay | Admitting: General Practice

## 2012-07-24 MED ORDER — LEVOTHYROXINE SODIUM 50 MCG PO TABS: 50.0000 ug | ORAL_TABLET | Freq: Every day | ORAL | Status: DC

## 2012-08-04 ENCOUNTER — Ambulatory Visit (INDEPENDENT_AMBULATORY_CARE_PROVIDER_SITE_OTHER): Payer: Medicare Other | Admitting: Internal Medicine

## 2012-08-04 ENCOUNTER — Ambulatory Visit (HOSPITAL_BASED_OUTPATIENT_CLINIC_OR_DEPARTMENT_OTHER): Payer: Medicare Other

## 2012-08-04 ENCOUNTER — Encounter: Payer: Self-pay | Admitting: Internal Medicine

## 2012-08-04 VITALS — BP 142/94 | HR 80 | Temp 98.2°F | Resp 16 | Wt 204.0 lb

## 2012-08-04 VITALS — BP 127/79 | HR 67 | Temp 98.4°F | Resp 18 | Ht 70.0 in | Wt 204.0 lb

## 2012-08-04 DIAGNOSIS — D499 Neoplasm of unspecified behavior of unspecified site: Secondary | ICD-10-CM

## 2012-08-04 DIAGNOSIS — E538 Deficiency of other specified B group vitamins: Secondary | ICD-10-CM

## 2012-08-04 DIAGNOSIS — R197 Diarrhea, unspecified: Secondary | ICD-10-CM

## 2012-08-04 DIAGNOSIS — E039 Hypothyroidism, unspecified: Secondary | ICD-10-CM

## 2012-08-04 DIAGNOSIS — I10 Essential (primary) hypertension: Secondary | ICD-10-CM

## 2012-08-04 DIAGNOSIS — Z23 Encounter for immunization: Secondary | ICD-10-CM

## 2012-08-04 DIAGNOSIS — R7309 Other abnormal glucose: Secondary | ICD-10-CM

## 2012-08-04 DIAGNOSIS — C7A00 Malignant carcinoid tumor of unspecified site: Secondary | ICD-10-CM

## 2012-08-04 DIAGNOSIS — C787 Secondary malignant neoplasm of liver and intrahepatic bile duct: Secondary | ICD-10-CM

## 2012-08-04 MED ORDER — OCTREOTIDE ACETATE 30 MG IM KIT
30.0000 mg | PACK | Freq: Once | INTRAMUSCULAR | Status: AC
  Administered 2012-08-04: 30 mg via INTRAMUSCULAR

## 2012-08-04 NOTE — Assessment & Plan Note (Signed)
Continue with current prescription therapy as reflected on the Med list.  

## 2012-08-04 NOTE — Progress Notes (Signed)
   Subjective:    Patient ID: Brian Mays, male    DOB: 1946/02/14, 66 y.o.   MRN: [id number]  HPI The patient presents for a follow-up of  chronic hypertension, B12 def and hypothyroidism, GERD controlled with medicines.   The patient has been doing well overall without major physical or psychological issues going on lately.   Wt Readings from Last 3 Encounters:  08/04/12 204 lb (92.534 kg)  06/02/12 200 lb (90.719 kg)  04/06/12 201 lb (91.173 kg)   BP Readings from Last 3 Encounters:  08/04/12 142/94  07/07/12 155/92  06/02/12 133/85     Review of Systems  Constitutional: Positive for fatigue. Negative for appetite change and unexpected weight change.  HENT: Positive for rhinorrhea and postnasal drip. Negative for hearing loss, nosebleeds, congestion, sore throat, sneezing, trouble swallowing and neck pain.   Eyes: Negative for pain, itching and visual disturbance.  Respiratory: Negative for cough.   Cardiovascular: Negative for chest pain, palpitations and leg swelling.  Gastrointestinal: Negative for nausea, diarrhea, blood in stool and abdominal distention.  Genitourinary: Negative for frequency and hematuria.  Musculoskeletal: Negative for back pain, joint swelling and gait problem.  Skin: Negative for rash.  Neurological: Negative for dizziness, tremors, speech difficulty and weakness.  Psychiatric/Behavioral: Negative for suicidal ideas, behavioral problems, disturbed wake/sleep cycle, dysphoric mood and agitation. The patient is not nervous/anxious.         Objective:   Physical Exam  Constitutional: He is oriented to person, place, and time. He appears well-developed. No distress.  HENT:  Mouth/Throat: Oropharynx is clear and moist.  Eyes: Conjunctivae normal are normal. Pupils are equal, round, and reactive to light.  Neck: Normal range of motion. No JVD present. No thyromegaly present.  Cardiovascular: Normal rate, regular rhythm, normal heart sounds and  intact distal pulses.  Exam reveals no gallop and no friction rub.   No murmur heard. Pulmonary/Chest: Effort normal and breath sounds normal. No respiratory distress. He has no wheezes. He has no rales. He exhibits no tenderness.  Abdominal: Soft. Bowel sounds are normal. He exhibits no distension and no mass. There is no tenderness. There is no rebound and no guarding.  Genitourinary: Guaiac negative stool. No penile tenderness.  Musculoskeletal: Normal range of motion. He exhibits no edema and no tenderness.  Lymphadenopathy:    He has no cervical adenopathy.  Neurological: He is alert and oriented to person, place, and time. He has normal reflexes. No cranial nerve deficit. He exhibits normal muscle tone. Coordination normal.  Skin: Skin is warm and dry. No rash noted.  Psychiatric: He has a normal mood and affect. His behavior is normal. Judgment and thought content normal.    Lab Results  Component Value Date   WBC 6.3 06/02/2012   HGB 14.1 06/02/2012   HCT 42.4 06/02/2012   PLT 175 06/02/2012   GLUCOSE 126* 06/02/2012   CHOL 123 04/03/2012   TRIG 101.0 04/03/2012   HDL 26.80* 04/03/2012   LDLDIRECT 147.6 12/08/2007   LDLCALC 76 04/03/2012   ALT 18 06/02/2012   AST 25 06/02/2012   NA 141 06/02/2012   K 4.3 06/02/2012   CL 104 06/02/2012   CREATININE 0.95 06/02/2012   BUN 14 06/02/2012   CO2 24 06/02/2012   TSH 4.32 04/03/2012   PSA 0.55 04/03/2012   INR 1.00 05/26/2010   HGBA1C 5.7 04/03/2012         Assessment & Plan:

## 2012-08-04 NOTE — Assessment & Plan Note (Signed)
Continue with current prescription therapy as reflected on the Med list. He'll check w/Dr Myna Hidalgo if he could have Zostavax

## 2012-08-04 NOTE — Patient Instructions (Signed)

## 2012-08-04 NOTE — Assessment & Plan Note (Signed)
He ha nl BM's now

## 2012-09-08 ENCOUNTER — Other Ambulatory Visit (HOSPITAL_BASED_OUTPATIENT_CLINIC_OR_DEPARTMENT_OTHER): Payer: Medicare Other | Admitting: Lab

## 2012-09-08 ENCOUNTER — Ambulatory Visit (HOSPITAL_BASED_OUTPATIENT_CLINIC_OR_DEPARTMENT_OTHER): Payer: Medicare Other | Admitting: Medical

## 2012-09-08 ENCOUNTER — Ambulatory Visit (HOSPITAL_BASED_OUTPATIENT_CLINIC_OR_DEPARTMENT_OTHER): Payer: Medicare Other

## 2012-09-08 VITALS — BP 124/79 | HR 90 | Temp 98.2°F | Resp 18 | Ht 70.0 in | Wt 205.0 lb

## 2012-09-08 DIAGNOSIS — C787 Secondary malignant neoplasm of liver and intrahepatic bile duct: Secondary | ICD-10-CM

## 2012-09-08 DIAGNOSIS — D499 Neoplasm of unspecified behavior of unspecified site: Secondary | ICD-10-CM

## 2012-09-08 DIAGNOSIS — C7A00 Malignant carcinoid tumor of unspecified site: Secondary | ICD-10-CM

## 2012-09-08 DIAGNOSIS — D3A098 Benign carcinoid tumors of other sites: Secondary | ICD-10-CM

## 2012-09-08 LAB — CBC WITH DIFFERENTIAL (CANCER CENTER ONLY)
BASO#: 0 10*3/uL (ref 0.0–0.2)
EOS%: 4.3 % (ref 0.0–7.0)
HGB: 14.4 g/dL (ref 13.0–17.1)
MCH: 29.3 pg (ref 28.0–33.4)
MCHC: 33.2 g/dL (ref 32.0–35.9)
MONO%: 4.8 % (ref 0.0–13.0)
NEUT#: 5.3 10*3/uL (ref 1.5–6.5)
RDW: 14 % (ref 11.1–15.7)

## 2012-09-08 MED ORDER — OCTREOTIDE ACETATE 30 MG IM KIT
30.0000 mg | PACK | Freq: Once | INTRAMUSCULAR | Status: AC
  Administered 2012-09-08: 30 mg via INTRAMUSCULAR

## 2012-09-08 NOTE — Patient Instructions (Signed)

## 2012-09-08 NOTE — Progress Notes (Signed)
Diagnosis: Metastatic carcinoid liver metastasis.  Current therapy: Sandostatin 30 mg IM q. monthly.  Interim history:   [name] presents today for an office followup visit.  Overall, he, reports, that he is doing extremely well.  He recently got back from a golf trip down in [place], [place].  He states, that within 4 days he played 108 holes.  He continues on Sandostatin 30 mg IM monthly.  He, reports, that he has a good appetite.  He denies any type of abdominal pain.  He denies any diarrhea.  He denies any nausea, vomiting, or constipation.  He denies any cough, chest pain, or shortness of breath.  He, reports, that he may have some mild flushing when he drinks beer on occasion.  He denies any obvious, bleeding.  He denies any headaches, visual changes, or rashes.  His last chromogranin A. level was 3.8 back in August.  Overall, [name] is doing quite well and appears, asymptomatic.  Review of Systems: Constitutional:Negative for malaise/fatigue, fever, chills, weight loss, diaphoresis, activity change, appetite change, and unexpected weight change.  HEENT: Negative for double vision, blurred vision, visual loss, ear pain, tinnitus, congestion, rhinorrhea, epistaxis sore throat or sinus disease, oral pain/lesion, tongue soreness Respiratory: Negative for cough, chest tightness, shortness of breath, wheezing and stridor.  Cardiovascular: Negative for chest pain, palpitations, leg swelling, orthopnea, PND, DOE or claudication Gastrointestinal: Negative for nausea, vomiting, abdominal pain, diarrhea, constipation, blood in stool, melena, hematochezia, abdominal distention, anal bleeding, rectal pain, anorexia and hematemesis.  Genitourinary: Negative for dysuria, frequency, hematuria,  Musculoskeletal: Negative for myalgias, back pain, joint swelling, arthralgias and gait problem.  Skin: Negative for rash, color change, pallor and wound.  Neurological:. Negative for  dizziness/light-headedness, tremors, seizures, syncope, facial asymmetry, speech difficulty, weakness, numbness, headaches and paresthesias.  Hematological: Negative for adenopathy. Does not bruise/bleed easily.  Psychiatric/Behavioral:  Negative for depression, no loss of interest in normal activity or change in sleep pattern.   Physical Exam: This is a 66 year old, well-developed, well-nourished, white gentleman, in no obvious distress Vitals: Temperature 90.2 degrees, pulse 90, respirations 18, blood pressure 124, 79, weight 205 pounds HEENT reveals a normocephalic, atraumatic skull, no scleral icterus, no oral lesions  Neck is supple without any cervical or supraclavicular adenopathy.  Lungs are clear to auscultation bilaterally. There are no wheezes, rales or rhonci Cardiac is regular rate and rhythm with a normal S1 and S2. There are no murmurs, rubs, or bruits.  Abdomen is soft with good bowel sounds, there is no palpable mass. There is no palpable hepatosplenomegaly. There is no palpable fluid wave.  Musculoskeletal no tenderness of the spine, ribs, or hips.  Extremities there are no clubbing, cyanosis, or edema.  Skin no petechia, purpura or ecchymosis Neurologic is nonfocal.  Laboratory Data: White count 7.3, hemoglobin 14.4, hematocrit 43.4, platelets 180,000  Current Outpatient Prescriptions on File Prior to Visit  Medication Sig Dispense Refill  . aspirin 81 MG tablet Take 81 mg by mouth daily.      . Cholecalciferol 1000 UNITS tablet Take 1,000 Units by mouth daily.        . Cyanocobalamin (VITAMIN B-12) 1000 MCG SUBL Place 1 tablet (1,000 mcg total) under the tongue daily.  100 tablet  3  . diphenoxylate-atropine (LOMOTIL) 2.5-0.025 MG per tablet       . hydrocortisone (ANUSOL-HC) 25 MG suppository Place 1 suppository (25 mg total) rectally 2 (two) times daily as needed for hemorrhoids.  30 suppository  1  . lansoprazole (  PREVACID) 15 MG capsule Take 15 mg by mouth daily.         [name] levothyroxine (SYNTHROID, LEVOTHROID) 50 MCG tablet Take 1 tablet (50 mcg total) by mouth daily.  90 tablet  1  . losartan (COZAAR) 100 MG tablet Take 100 mg by mouth daily.      [name] octreotide (SANDOSTATIN LAR) 30 MG injection Inject 30 mg into the muscle every 28 (twenty-eight) days.        . Pancrelipase, Lip-Prot-Amyl, (CREON) 24000 UNITS CPEP Take 1 capsule (24,000 Units total) by mouth 3 (three) times daily with meals.  90 capsule  3  . promethazine (PHENERGAN) 25 MG tablet Take 25-50 mg by mouth 4 (four) times daily as needed.        . triamcinolone (KENALOG) 0.5 % cream APPLY TWICE A DAY AS NEEDED  120 g  1  . [DISCONTINUED] hydrocortisone (ANUSOL-HC) 25 MG suppository Place 1 suppository (25 mg total) rectally 2 (two) times daily.  20 suppository  2   Assessment/Plan: This is a pleasant, 66 year old, white gentleman, with the following issues:  #1 .  History of metastatic carcinoid.  He appears asymptomatic.  I do not see any evidence of tumor progression.  He did have intrahepatic therapy.  He had yttrium 90 therapy back in July 2011.  We will continue to follow.  His chromogranin A. level.  If for some reason, we start seeing, changes with this or he becomes symptomatic to we can reexamine him with radiological studies.  He will go ahead and receive his Sandostatin injection today and monthly.    #2.  Followup.  [name] will follow back up with [place] in 3 months, but before then should there be questions or concerns.

## 2012-09-13 LAB — CHROMOGRANIN A: Chromogranin A: 3.4 ng/mL (ref 1.9–15.0)

## 2012-09-13 LAB — COMPREHENSIVE METABOLIC PANEL
CO2: 28 mEq/L (ref 19–32)
Creatinine, Ser: 0.94 mg/dL (ref 0.50–1.35)
Glucose, Bld: 71 mg/dL (ref 70–99)
Total Bilirubin: 0.7 mg/dL (ref 0.3–1.2)

## 2012-09-13 LAB — LACTATE DEHYDROGENASE: LDH: 184 U/L (ref 94–250)

## 2012-09-21 ENCOUNTER — Other Ambulatory Visit: Payer: Self-pay | Admitting: Internal Medicine

## 2012-10-03 ENCOUNTER — Other Ambulatory Visit: Payer: Self-pay | Admitting: *Deleted

## 2012-10-03 MED ORDER — LOSARTAN POTASSIUM 100 MG PO TABS: 100.0000 mg | ORAL_TABLET | Freq: Every day | ORAL | Status: DC

## 2012-10-06 ENCOUNTER — Ambulatory Visit (HOSPITAL_BASED_OUTPATIENT_CLINIC_OR_DEPARTMENT_OTHER): Payer: Medicare Other

## 2012-10-06 VITALS — BP 153/95 | HR 79 | Temp 97.5°F | Resp 18

## 2012-10-06 DIAGNOSIS — C7A00 Malignant carcinoid tumor of unspecified site: Secondary | ICD-10-CM

## 2012-10-06 DIAGNOSIS — C787 Secondary malignant neoplasm of liver and intrahepatic bile duct: Secondary | ICD-10-CM

## 2012-10-06 DIAGNOSIS — D499 Neoplasm of unspecified behavior of unspecified site: Secondary | ICD-10-CM

## 2012-10-06 MED ORDER — OCTREOTIDE ACETATE 30 MG IM KIT
30.0000 mg | PACK | Freq: Once | INTRAMUSCULAR | Status: AC
  Administered 2012-10-06: 30 mg via INTRAMUSCULAR

## 2012-10-06 NOTE — Progress Notes (Deleted)
Brian Mays presented for Portacath access and flush. Proper placement of portacath confirmed by CXR. Portacath located in the right chest wall accessed with  H 20 needle. Clean, Dry and Intact Good blood return present. Portacath flushed with 20ml NS and 500U/19ml Heparin per protocol and needle removed intact. Procedure without incident. Patient tolerated procedure well.

## 2012-10-06 NOTE — Patient Instructions (Addendum)

## 2012-11-03 ENCOUNTER — Ambulatory Visit (HOSPITAL_BASED_OUTPATIENT_CLINIC_OR_DEPARTMENT_OTHER): Payer: Medicare Other

## 2012-11-03 VITALS — BP 140/91 | HR 70 | Temp 97.7°F | Resp 20

## 2012-11-03 DIAGNOSIS — D499 Neoplasm of unspecified behavior of unspecified site: Secondary | ICD-10-CM

## 2012-11-03 DIAGNOSIS — C787 Secondary malignant neoplasm of liver and intrahepatic bile duct: Secondary | ICD-10-CM

## 2012-11-03 DIAGNOSIS — C7A00 Malignant carcinoid tumor of unspecified site: Secondary | ICD-10-CM

## 2012-11-03 MED ORDER — OCTREOTIDE ACETATE 30 MG IM KIT
30.0000 mg | PACK | Freq: Once | INTRAMUSCULAR | Status: AC
  Administered 2012-11-03: 30 mg via INTRAMUSCULAR

## 2012-11-03 NOTE — Patient Instructions (Signed)

## 2012-12-08 ENCOUNTER — Ambulatory Visit (HOSPITAL_BASED_OUTPATIENT_CLINIC_OR_DEPARTMENT_OTHER): Payer: Medicare Other

## 2012-12-08 ENCOUNTER — Ambulatory Visit (INDEPENDENT_AMBULATORY_CARE_PROVIDER_SITE_OTHER): Payer: Medicare Other | Admitting: Internal Medicine

## 2012-12-08 ENCOUNTER — Encounter: Payer: Self-pay | Admitting: Internal Medicine

## 2012-12-08 ENCOUNTER — Ambulatory Visit (HOSPITAL_BASED_OUTPATIENT_CLINIC_OR_DEPARTMENT_OTHER): Payer: Medicare Other | Admitting: Hematology & Oncology

## 2012-12-08 ENCOUNTER — Other Ambulatory Visit (HOSPITAL_BASED_OUTPATIENT_CLINIC_OR_DEPARTMENT_OTHER): Payer: Medicare Other | Admitting: Lab

## 2012-12-08 VITALS — BP 152/84 | HR 63 | Temp 98.0°F | Resp 18 | Ht 70.0 in | Wt 213.0 lb

## 2012-12-08 VITALS — BP 150/72 | HR 76 | Temp 97.9°F | Resp 16 | Wt 204.0 lb

## 2012-12-08 DIAGNOSIS — C7A00 Malignant carcinoid tumor of unspecified site: Secondary | ICD-10-CM

## 2012-12-08 DIAGNOSIS — C787 Secondary malignant neoplasm of liver and intrahepatic bile duct: Secondary | ICD-10-CM

## 2012-12-08 DIAGNOSIS — D499 Neoplasm of unspecified behavior of unspecified site: Secondary | ICD-10-CM

## 2012-12-08 DIAGNOSIS — E538 Deficiency of other specified B group vitamins: Secondary | ICD-10-CM

## 2012-12-08 DIAGNOSIS — K219 Gastro-esophageal reflux disease without esophagitis: Secondary | ICD-10-CM

## 2012-12-08 DIAGNOSIS — J329 Chronic sinusitis, unspecified: Secondary | ICD-10-CM

## 2012-12-08 DIAGNOSIS — K861 Other chronic pancreatitis: Secondary | ICD-10-CM

## 2012-12-08 LAB — CBC WITH DIFFERENTIAL (CANCER CENTER ONLY)
BASO#: 0 10*3/uL (ref 0.0–0.2)
Eosinophils Absolute: 0.4 10*3/uL (ref 0.0–0.5)
HGB: 13.9 g/dL (ref 13.0–17.1)
LYMPH%: 23.8 % (ref 14.0–48.0)
MCH: 29.3 pg (ref 28.0–33.4)
MCV: 88 fL (ref 82–98)
MONO#: 0.5 10*3/uL (ref 0.1–0.9)
Platelets: 167 10*3/uL (ref 145–400)
RBC: 4.74 10*6/uL (ref 4.20–5.70)
WBC: 6.3 10*3/uL (ref 4.0–10.0)

## 2012-12-08 MED ORDER — OCTREOTIDE ACETATE 30 MG IM KIT
30.0000 mg | PACK | Freq: Once | INTRAMUSCULAR | Status: AC
  Administered 2012-12-08: 30 mg via INTRAMUSCULAR

## 2012-12-08 MED ORDER — AZITHROMYCIN 250 MG PO TABS: ORAL_TABLET | ORAL | Status: DC

## 2012-12-08 MED ORDER — LANSOPRAZOLE 30 MG PO CPDR: 30.0000 mg | DELAYED_RELEASE_CAPSULE | Freq: Every day | ORAL | Status: DC

## 2012-12-08 NOTE — Assessment & Plan Note (Signed)
Continue with current prescription therapy as reflected on the Med list.  

## 2012-12-08 NOTE — Assessment & Plan Note (Signed)
z pac 

## 2012-12-08 NOTE — Progress Notes (Signed)
This office note has been dictated.

## 2012-12-08 NOTE — Progress Notes (Signed)
   Subjective:     HPI   C/o GERD x 2-3 mo C/o sinusitis sx's x1 wk The patient presents for a follow-up of  chronic hypertension, B12 def and hypothyroidism, GERD controlled with medicines.  The patient has been doing well overall without major physical or psychological issues going on lately.   Wt Readings from Last 3 Encounters:  12/08/12 204 lb (92.534 kg)  09/08/12 205 lb (92.987 kg)  08/04/12 204 lb (92.534 kg)   BP Readings from Last 3 Encounters:  12/08/12 150/72  11/03/12 140/91  10/06/12 153/95     Review of Systems  Constitutional: Positive for fatigue. Negative for appetite change and unexpected weight change.  HENT: Positive for rhinorrhea and postnasal drip. Negative for hearing loss, nosebleeds, congestion, sore throat, sneezing, trouble swallowing and neck pain.   Eyes: Negative for pain, itching and visual disturbance.  Respiratory: Negative for cough.   Cardiovascular: Negative for chest pain, palpitations and leg swelling.  Gastrointestinal: Negative for nausea, diarrhea, blood in stool and abdominal distention.  Genitourinary: Negative for frequency and hematuria.  Musculoskeletal: Negative for back pain, joint swelling and gait problem.  Skin: Negative for rash.  Neurological: Negative for dizziness, tremors, speech difficulty and weakness.  Psychiatric/Behavioral: Negative for suicidal ideas, behavioral problems, sleep disturbance, dysphoric mood and agitation. The patient is not nervous/anxious.         Objective:   Physical Exam  Constitutional: He is oriented to person, place, and time. He appears well-developed. No distress.  HENT:  Mouth/Throat: Oropharynx is clear and moist.  Eyes: Conjunctivae normal are normal. Pupils are equal, round, and reactive to light.  Neck: Normal range of motion. No JVD present. No thyromegaly present.  Cardiovascular: Normal rate, regular rhythm, normal heart sounds and intact distal pulses.  Exam reveals no  gallop and no friction rub.   No murmur heard. Pulmonary/Chest: Effort normal and breath sounds normal. No respiratory distress. He has no wheezes. He has no rales. He exhibits no tenderness.  Abdominal: Soft. Bowel sounds are normal. He exhibits no distension and no mass. There is no tenderness. There is no rebound and no guarding.  Genitourinary: Guaiac negative stool. No penile tenderness.  Musculoskeletal: Normal range of motion. He exhibits no edema and no tenderness.  Lymphadenopathy:    He has no cervical adenopathy.  Neurological: He is alert and oriented to person, place, and time. He has normal reflexes. No cranial nerve deficit. He exhibits normal muscle tone. Coordination normal.  Skin: Skin is warm and dry. No rash noted.  Psychiatric: He has a normal mood and affect. His behavior is normal. Judgment and thought content normal.    Lab Results  Component Value Date   WBC 7.3 09/08/2012   HGB 14.4 09/08/2012   HCT 43.4 09/08/2012   PLT 180 09/08/2012   GLUCOSE 71 09/08/2012   CHOL 123 04/03/2012   TRIG 101.0 04/03/2012   HDL 26.80* 04/03/2012   LDLDIRECT 147.6 12/08/2007   LDLCALC 76 04/03/2012   ALT 17 09/08/2012   AST 25 09/08/2012   NA 139 09/08/2012   K 4.0 09/08/2012   CL 104 09/08/2012   CREATININE 0.94 09/08/2012   BUN 15 09/08/2012   CO2 28 09/08/2012   TSH 4.32 04/03/2012   PSA 0.55 04/03/2012   INR 1.00 05/26/2010   HGBA1C 5.7 04/03/2012         Assessment & Plan:

## 2012-12-08 NOTE — Assessment & Plan Note (Addendum)
Continue with current prescription therapy as reflected on the Med list. Dr Myna Hidalgo appt today

## 2012-12-08 NOTE — Assessment & Plan Note (Signed)
Chronic, recurrent D/c OTC Prevacid Start Prevacid 30 mg qd GI ref if issues

## 2012-12-09 NOTE — Progress Notes (Signed)
CC:   Brian Quint. Plotnikov, MD Wilhemina Bonito. Marina Goodell, MD Deveron Furlong, MD  DIAGNOSIS:  Metastatic carcinoid, liver metastasis.  CURRENT THERAPY:  Sandostatin 30 mg IM q.month.  INTERIM HISTORY:  Brian Mays comes in for his followup.  He is feeling fairly well.  He has had a little bit of reflux.  He sees Dr. Posey Rea regarding this.  He has had no diarrhea.  He has had no cough or shortness of breath.  He still working pretty much full-time.  He has not noticed any kind of rashes.  He has had no leg swelling.  There have been no fevers, sweats or chills.  His last chromogranin A level was 3.4 back in November.  Of note, his last PSA back in June of 2013 was 0.55.  We have not done any scans on him probably for a couple years.  Again, he has been pretty much asymptomatic, so I have not felt that his scans were going to be necessary, as he has been asymptomatic.  PHYSICAL EXAMINATION:  This is a well-developed, well-nourished white gentleman in no obvious distress.  Vital signs:  Temperature of 98, pulse 63, respiratory rate 18, blood pressure 152/84.  Weight is 203. Head/neck:  Normocephalic, atraumatic skull.  There are no ocular or oral lesions.  There are no palpable cervical or supraclavicular lymph nodes.  Lungs:  Clear bilaterally.  Cardiac:  Regular rate and rhythm with a normal S1, S2.  There are no murmurs, rubs, or bruits.  Abdomen: Soft with good bowel sounds.  There is no palpable abdominal mass.  He has a well-healed laparotomy scar.  There is no guarding or rebound tenderness.  There is no palpable hepatosplenomegaly.  Extremities:  No clubbing, cyanosis or edema.  Neurological:  No focal neurological deficits.  LABORATORY STUDIES:  White cell count 6.3, hemoglobin 14, hematocrit 42, platelet count is 167.  IMPRESSION:  Brian Mays is a 67 year old gentleman with a metastatic carcinoid tumor.  He has been very stable.  We will continue to follow him along  supportively.  He gets his Sandostatin every month.  Again, I do not think we need to do any scans on him unless we see a change in his symptoms or labs.  We will plan to see her back ourselves in 3 months.    ______________________________ Josph Macho, M.D. PRE/MEDQ  D:  12/08/2012  T:  12/09/2012  Job:  [id number]

## 2012-12-13 LAB — LACTATE DEHYDROGENASE: LDH: 185 U/L (ref 94–250)

## 2012-12-13 LAB — COMPREHENSIVE METABOLIC PANEL
Albumin: 4.1 g/dL (ref 3.5–5.2)
BUN: 12 mg/dL (ref 6–23)
CO2: 32 mEq/L (ref 19–32)
Glucose, Bld: 103 mg/dL — ABNORMAL HIGH (ref 70–99)
Potassium: 4.3 mEq/L (ref 3.5–5.3)
Sodium: 138 mEq/L (ref 135–145)
Total Bilirubin: 0.6 mg/dL (ref 0.3–1.2)
Total Protein: 7.2 g/dL (ref 6.0–8.3)

## 2012-12-13 LAB — CHROMOGRANIN A: Chromogranin A: 2.2 ng/mL (ref 1.9–15.0)

## 2012-12-18 ENCOUNTER — Other Ambulatory Visit: Payer: Self-pay | Admitting: Internal Medicine

## 2012-12-18 MED ORDER — PANCRELIPASE (LIP-PROT-AMYL) 24000-76000 UNITS PO CPEP: 1.0000 | ORAL_CAPSULE | Freq: Three times a day (TID) | ORAL | Status: DC

## 2012-12-23 ENCOUNTER — Other Ambulatory Visit: Payer: Self-pay | Admitting: Hematology & Oncology

## 2013-01-05 ENCOUNTER — Ambulatory Visit: Payer: Medicare Other

## 2013-01-08 ENCOUNTER — Telehealth: Payer: Self-pay | Admitting: Hematology & Oncology

## 2013-01-08 NOTE — Telephone Encounter (Signed)
Left pt message to call and reschedule 01-05-13

## 2013-01-08 NOTE — Telephone Encounter (Signed)
Per Alvino Chapel to resch 01/05/13 missed apt.  i called and spoke with patient and resch missed apt for Thursday 01/11/13.  Patient is aware of apt date/time and states he will be here.

## 2013-01-11 ENCOUNTER — Ambulatory Visit (HOSPITAL_BASED_OUTPATIENT_CLINIC_OR_DEPARTMENT_OTHER): Payer: Medicare Other

## 2013-01-11 VITALS — BP 159/91 | HR 76 | Temp 97.3°F | Resp 18

## 2013-01-11 DIAGNOSIS — C7A00 Malignant carcinoid tumor of unspecified site: Secondary | ICD-10-CM

## 2013-01-11 DIAGNOSIS — C787 Secondary malignant neoplasm of liver and intrahepatic bile duct: Secondary | ICD-10-CM

## 2013-01-11 DIAGNOSIS — D499 Neoplasm of unspecified behavior of unspecified site: Secondary | ICD-10-CM

## 2013-01-11 MED ORDER — OCTREOTIDE ACETATE 30 MG IM KIT
30.0000 mg | PACK | Freq: Once | INTRAMUSCULAR | Status: AC
  Administered 2013-01-11: 30 mg via INTRAMUSCULAR

## 2013-01-11 NOTE — Patient Instructions (Signed)

## 2013-01-18 ENCOUNTER — Other Ambulatory Visit: Payer: Self-pay | Admitting: Internal Medicine

## 2013-02-02 ENCOUNTER — Ambulatory Visit: Payer: Medicare Other

## 2013-02-02 ENCOUNTER — Telehealth: Payer: Self-pay | Admitting: Hematology & Oncology

## 2013-02-02 NOTE — Telephone Encounter (Signed)
Patient called and cx 02/02/13 and 03/02/13 apt and resch for 02/09/13 and 03/09/13

## 2013-02-09 ENCOUNTER — Ambulatory Visit (HOSPITAL_BASED_OUTPATIENT_CLINIC_OR_DEPARTMENT_OTHER): Payer: Medicare Other

## 2013-02-09 VITALS — BP 144/90 | HR 60 | Temp 97.4°F | Resp 16

## 2013-02-09 DIAGNOSIS — D499 Neoplasm of unspecified behavior of unspecified site: Secondary | ICD-10-CM

## 2013-02-09 DIAGNOSIS — C7A00 Malignant carcinoid tumor of unspecified site: Secondary | ICD-10-CM

## 2013-02-09 MED ORDER — OCTREOTIDE ACETATE 30 MG IM KIT
30.0000 mg | PACK | Freq: Once | INTRAMUSCULAR | Status: AC
  Administered 2013-02-09: 30 mg via INTRAMUSCULAR

## 2013-02-09 NOTE — Patient Instructions (Signed)

## 2013-03-02 ENCOUNTER — Ambulatory Visit: Payer: Medicare Other

## 2013-03-02 ENCOUNTER — Ambulatory Visit: Payer: Medicare Other | Admitting: Hematology & Oncology

## 2013-03-02 ENCOUNTER — Other Ambulatory Visit: Payer: Medicare Other | Admitting: Lab

## 2013-03-09 ENCOUNTER — Ambulatory Visit (HOSPITAL_BASED_OUTPATIENT_CLINIC_OR_DEPARTMENT_OTHER): Payer: Medicare Other | Admitting: Hematology & Oncology

## 2013-03-09 ENCOUNTER — Ambulatory Visit (HOSPITAL_BASED_OUTPATIENT_CLINIC_OR_DEPARTMENT_OTHER): Payer: Medicare Other

## 2013-03-09 ENCOUNTER — Other Ambulatory Visit (HOSPITAL_BASED_OUTPATIENT_CLINIC_OR_DEPARTMENT_OTHER): Payer: Medicare Other | Admitting: Lab

## 2013-03-09 VITALS — BP 148/93 | HR 64 | Temp 98.0°F | Resp 18 | Ht 70.0 in | Wt 206.0 lb

## 2013-03-09 DIAGNOSIS — D499 Neoplasm of unspecified behavior of unspecified site: Secondary | ICD-10-CM

## 2013-03-09 DIAGNOSIS — R21 Rash and other nonspecific skin eruption: Secondary | ICD-10-CM

## 2013-03-09 DIAGNOSIS — C7A00 Malignant carcinoid tumor of unspecified site: Secondary | ICD-10-CM

## 2013-03-09 DIAGNOSIS — C787 Secondary malignant neoplasm of liver and intrahepatic bile duct: Secondary | ICD-10-CM

## 2013-03-09 LAB — CBC WITH DIFFERENTIAL (CANCER CENTER ONLY)
BASO#: 0 10*3/uL (ref 0.0–0.2)
BASO%: 0.5 % (ref 0.0–2.0)
EOS%: 4.7 % (ref 0.0–7.0)
HGB: 13.6 g/dL (ref 13.0–17.1)
LYMPH#: 1.3 10*3/uL (ref 0.9–3.3)
MCH: 29.2 pg (ref 28.0–33.4)
MCHC: 32.8 g/dL (ref 32.0–35.9)
MONO%: 8.3 % (ref 0.0–13.0)
NEUT#: 3.7 10*3/uL (ref 1.5–6.5)
Platelets: 170 10*3/uL (ref 145–400)

## 2013-03-09 MED ORDER — OCTREOTIDE ACETATE 30 MG IM KIT
30.0000 mg | PACK | Freq: Once | INTRAMUSCULAR | Status: AC
  Administered 2013-03-09: 30 mg via INTRAMUSCULAR

## 2013-03-09 NOTE — Progress Notes (Signed)
This office note has been dictated.

## 2013-03-10 NOTE — Progress Notes (Signed)
CC:   Georgina Quint. Plotnikov, MD  DIAGNOSIS:  Metastatic carcinoid, liver metastasis.  CURRENT THERAPY:  Sandostatin 30 mg IM q.monthly.  INTERIM HISTORY:  [name] comes in for his followup.  He is doing okay. He got through the winter time okay.  He is still working.  He is working pretty hard actually.  He has noticed a rash on his right forearm.  These are macular type lesions with little areas of central eschar.  He has had them for a few weeks.  They do itch a little bit.  He has no rashes anywhere else.  I told him to try some cortisone cream for this.  He has had no diarrhea.  He has had some pollen issues with some wheezing.  His last chromogranin A level back in February was 2.2.  He has had no abdominal pain.  He has had no fever, sweats or chills. He has had no change in bowel or bladder habits.  PHYSICAL EXAMINATION:  General:  This is a well-developed, well- nourished white gentleman in no obvious distress.  Vital signs:  Show a temperature of 98, pulse 64, respiratory rate 16, blood pressure 148/93. Weight is 206.  Head and neck:  Shows a normocephalic, atraumatic skull. There are no ocular or oral lesions.  There are no palpable cervical or supraclavicular lymph nodes.  Lungs:  Clear bilaterally.  Cardiac: Regular rate and rhythm with a normal S1 and S2.  There are no murmurs, rubs or bruits.  Abdomen:  Soft with good bowel sounds.  There is no palpable abdominal mass.  There is no fluid wave.  There is no palpable hepatosplenomegaly.  Back:  No tenderness over the spine, ribs or hips. Extremities:  Show no clubbing, cyanosis or edema.  He has good range of motion of the joints.  Skin:  Does show new macular papular type lesions on his lower right forearm.  These have a central eschar.  These do not blanch with palpation.  LABORATORY STUDIES:  White cell count is 5.8, hemoglobin 13.6, hematocrit 41.5, platelet count 170.  IMPRESSION:  [name] is a 67 year old  gentleman with metastatic carcinoid.  He did actually undergo intrahepatic therapy.  I think this was actually done about 3 years ago.  We have not had to do any scans on him for a good 2 years because he has been pretty much asymptomatic.  His chromogranin A level has been nice and low.  Again, I am not sure what this rash on his right forearm is.  I would not think that it would be related to his carcinoid.  If the rash persists, then he certainly could have this biopsied.  Hopefully, some hydrocortisone cream will help.  We will continue to follow him along monthly.  We will get him back to see [place] in another 3 months.  He will have his Sandostatin monthly.    ______________________________ Josph Macho, M.D. PRE/MEDQ  D:  03/09/2013  T:  03/10/2013  Job:  [id number]

## 2013-03-14 LAB — COMPREHENSIVE METABOLIC PANEL
ALT: 17 U/L (ref 0–53)
AST: 24 U/L (ref 0–37)
Albumin: 4 g/dL (ref 3.5–5.2)
Alkaline Phosphatase: 51 U/L (ref 39–117)
Potassium: 4.3 mEq/L (ref 3.5–5.3)
Sodium: 139 mEq/L (ref 135–145)
Total Bilirubin: 0.7 mg/dL (ref 0.3–1.2)
Total Protein: 7 g/dL (ref 6.0–8.3)

## 2013-03-14 LAB — CHROMOGRANIN A: Chromogranin A: 3 ng/mL (ref 1.9–15.0)

## 2013-04-06 ENCOUNTER — Ambulatory Visit (HOSPITAL_BASED_OUTPATIENT_CLINIC_OR_DEPARTMENT_OTHER): Payer: Medicare Other

## 2013-04-06 VITALS — BP 142/87 | HR 72 | Temp 98.4°F | Resp 18

## 2013-04-06 DIAGNOSIS — C787 Secondary malignant neoplasm of liver and intrahepatic bile duct: Secondary | ICD-10-CM

## 2013-04-06 DIAGNOSIS — D499 Neoplasm of unspecified behavior of unspecified site: Secondary | ICD-10-CM

## 2013-04-06 DIAGNOSIS — C7A00 Malignant carcinoid tumor of unspecified site: Secondary | ICD-10-CM

## 2013-04-06 MED ORDER — OCTREOTIDE ACETATE 30 MG IM KIT
30.0000 mg | PACK | Freq: Once | INTRAMUSCULAR | Status: AC
  Administered 2013-04-06: 30 mg via INTRAMUSCULAR

## 2013-04-06 NOTE — Patient Instructions (Signed)

## 2013-05-03 ENCOUNTER — Ambulatory Visit (HOSPITAL_BASED_OUTPATIENT_CLINIC_OR_DEPARTMENT_OTHER): Payer: Medicare Other

## 2013-05-03 VITALS — BP 156/97 | HR 65 | Temp 97.0°F

## 2013-05-03 DIAGNOSIS — C7A00 Malignant carcinoid tumor of unspecified site: Secondary | ICD-10-CM

## 2013-05-03 DIAGNOSIS — C787 Secondary malignant neoplasm of liver and intrahepatic bile duct: Secondary | ICD-10-CM

## 2013-05-03 DIAGNOSIS — D499 Neoplasm of unspecified behavior of unspecified site: Secondary | ICD-10-CM

## 2013-05-03 MED ORDER — OCTREOTIDE ACETATE 30 MG IM KIT
30.0000 mg | PACK | Freq: Once | INTRAMUSCULAR | Status: AC
  Administered 2013-05-03: 30 mg via INTRAMUSCULAR

## 2013-05-03 NOTE — Patient Instructions (Addendum)

## 2013-05-07 ENCOUNTER — Ambulatory Visit: Payer: Medicare Other

## 2013-05-09 ENCOUNTER — Other Ambulatory Visit: Payer: Self-pay | Admitting: Hematology & Oncology

## 2013-06-01 ENCOUNTER — Ambulatory Visit (HOSPITAL_BASED_OUTPATIENT_CLINIC_OR_DEPARTMENT_OTHER): Payer: Medicare Other

## 2013-06-01 ENCOUNTER — Other Ambulatory Visit (HOSPITAL_BASED_OUTPATIENT_CLINIC_OR_DEPARTMENT_OTHER): Payer: Medicare Other | Admitting: Lab

## 2013-06-01 ENCOUNTER — Telehealth: Payer: Self-pay | Admitting: Hematology & Oncology

## 2013-06-01 ENCOUNTER — Ambulatory Visit (HOSPITAL_BASED_OUTPATIENT_CLINIC_OR_DEPARTMENT_OTHER): Payer: Medicare Other | Admitting: Hematology & Oncology

## 2013-06-01 VITALS — BP 168/92 | HR 70 | Temp 97.9°F | Resp 18 | Wt 216.0 lb

## 2013-06-01 DIAGNOSIS — E039 Hypothyroidism, unspecified: Secondary | ICD-10-CM

## 2013-06-01 DIAGNOSIS — C7A00 Malignant carcinoid tumor of unspecified site: Secondary | ICD-10-CM

## 2013-06-01 DIAGNOSIS — D499 Neoplasm of unspecified behavior of unspecified site: Secondary | ICD-10-CM

## 2013-06-01 LAB — CBC WITH DIFFERENTIAL (CANCER CENTER ONLY)
BASO#: 0.1 10*3/uL (ref 0.0–0.2)
Eosinophils Absolute: 0.3 10*3/uL (ref 0.0–0.5)
HGB: 13.8 g/dL (ref 13.0–17.1)
LYMPH%: 20.3 % (ref 14.0–48.0)
MCH: 29.2 pg (ref 28.0–33.4)
MCV: 90 fL (ref 82–98)
MONO#: 0.7 10*3/uL (ref 0.1–0.9)
MONO%: 8.2 % (ref 0.0–13.0)
RBC: 4.73 10*6/uL (ref 4.20–5.70)
WBC: 8.6 10*3/uL (ref 4.0–10.0)

## 2013-06-01 LAB — TSH CHCC: TSH: 4.074 m(IU)/L (ref 0.320–4.118)

## 2013-06-01 MED ORDER — OCTREOTIDE ACETATE 30 MG IM KIT
30.0000 mg | PACK | Freq: Once | INTRAMUSCULAR | Status: AC
  Administered 2013-06-01: 30 mg via INTRAMUSCULAR

## 2013-06-01 NOTE — Progress Notes (Signed)
CC:   Brian Quint. Plotnikov, MD  DIAGNOSIS:  Metastatic carcinoid tumor, liver metastasis.  CURRENT THERAPY:  Sandostatin 30 mg IM monthly.  INTERIM HISTORY:  Brian Mays comes in for his followup.  He is doing fairly well.  He has gained some weight.  He is a little bit tired.  I will check a TSH on him.  He does have hypothyroidism.  He is on Synthroid.  He has had no problems with diarrhea.  He has had no leg swelling.  He has had no rashes.  He has had no cough or shortness of breath.  He and his family were planning go to [place] in early November.  They probably will be there for about 3 weeks.  Of note, his last chromogranin A level back in May was 3.  We have not had to do any scans on him probably for a good 2 years as he has been pretty much asymptomatic.  PHYSICAL EXAMINATION:  General:  This is a well-developed, well- nourished white gentleman in no obvious distress.  Vital signs: Temperature of 97.8, pulse 70, respiratory rate 18, blood pressure 168/92.  Weight is 216.  Head and neck:  Normocephalic, atraumatic skull.  There are no ocular or oral lesions.  There are no palpable cervical or supraclavicular lymph nodes.  Lungs:  Clear bilaterally. Cardiac exam:  Regular rate and rhythm with a normal S1 and S2.  There are no murmurs, rubs or bruits.  Abdomen:  Soft.  He has good bowel sounds.  He has a laparotomy scar.  There is no fluid wave.  There is no palpable hepatosplenomegaly.  Extremities:  Show no clubbing, cyanosis or edema.  Skin exam:  Shows no rashes, ecchymosis, or petechia. Neurological exam:  Shows no focal neurological deficits.  LABORATORY STUDIES:  White cell count is 8.6, hemoglobin 13.8, hematocrit 43.5, platelet count 168.  IMPRESSION:  Brian Mays is a very nice 67 year old gentleman with metastatic carcinoid tumor.  He has done incredibly well.  He did undergo intrahepatic therapy with radioisotope therapy.  This was 3 years ago.  He has had a very  good response with this.  We will continue to see him ourselves every 3 or 4 months.  He comes in monthly for his Sandostatin.  Again, we will check his TSH and if this is high, we can readjust his Synthroid dose.    ______________________________ Josph Macho, M.D. PRE/MEDQ  D:  06/01/2013  T:  06/01/2013  Job:  [id number]

## 2013-06-01 NOTE — Telephone Encounter (Signed)
Mailed 8-29 thru 11-28 schedule to pt.

## 2013-06-01 NOTE — Progress Notes (Signed)
This office note has been dictated.

## 2013-06-04 ENCOUNTER — Other Ambulatory Visit: Payer: Self-pay | Admitting: Internal Medicine

## 2013-06-04 ENCOUNTER — Telehealth: Payer: Self-pay | Admitting: Oncology

## 2013-06-04 NOTE — Telephone Encounter (Addendum)
Message copied by Lacie Draft on Mon Jun 04, 2013 12:49 PM ------      Message from: Josph Macho      Created: Sun Jun 03, 2013  7:34 PM       Call - thyroid is ok.  pete ------Left message on cell phone.

## 2013-06-06 ENCOUNTER — Other Ambulatory Visit: Payer: Self-pay

## 2013-06-08 LAB — COMPREHENSIVE METABOLIC PANEL
ALT: 21 U/L (ref 0–53)
CO2: 29 mEq/L (ref 19–32)
Calcium: 9 mg/dL (ref 8.4–10.5)
Chloride: 102 mEq/L (ref 96–112)
Creatinine, Ser: 0.93 mg/dL (ref 0.50–1.35)
Glucose, Bld: 143 mg/dL — ABNORMAL HIGH (ref 70–99)
Sodium: 139 mEq/L (ref 135–145)
Total Bilirubin: 0.5 mg/dL (ref 0.3–1.2)
Total Protein: 7.1 g/dL (ref 6.0–8.3)

## 2013-06-08 LAB — LACTATE DEHYDROGENASE: LDH: 166 U/L (ref 94–250)

## 2013-06-11 ENCOUNTER — Other Ambulatory Visit: Payer: Medicare Other | Admitting: Lab

## 2013-06-11 ENCOUNTER — Ambulatory Visit: Payer: Medicare Other

## 2013-06-11 ENCOUNTER — Ambulatory Visit: Payer: Medicare Other | Admitting: Hematology & Oncology

## 2013-06-29 ENCOUNTER — Ambulatory Visit (HOSPITAL_BASED_OUTPATIENT_CLINIC_OR_DEPARTMENT_OTHER): Payer: Medicare Other

## 2013-06-29 VITALS — BP 132/81 | HR 80 | Temp 99.0°F | Resp 18

## 2013-06-29 DIAGNOSIS — C7A00 Malignant carcinoid tumor of unspecified site: Secondary | ICD-10-CM

## 2013-06-29 DIAGNOSIS — E340 Carcinoid syndrome: Secondary | ICD-10-CM

## 2013-06-29 DIAGNOSIS — D499 Neoplasm of unspecified behavior of unspecified site: Secondary | ICD-10-CM

## 2013-06-29 MED ORDER — OCTREOTIDE ACETATE 30 MG IM KIT
30.0000 mg | PACK | Freq: Once | INTRAMUSCULAR | Status: AC
  Administered 2013-06-29: 30 mg via INTRAMUSCULAR

## 2013-06-29 NOTE — Patient Instructions (Signed)

## 2013-07-13 ENCOUNTER — Other Ambulatory Visit: Payer: Self-pay | Admitting: Internal Medicine

## 2013-07-17 ENCOUNTER — Other Ambulatory Visit: Payer: Self-pay | Admitting: Internal Medicine

## 2013-07-27 ENCOUNTER — Ambulatory Visit (HOSPITAL_BASED_OUTPATIENT_CLINIC_OR_DEPARTMENT_OTHER): Payer: Medicare Other

## 2013-07-27 ENCOUNTER — Telehealth: Payer: Self-pay | Admitting: Hematology & Oncology

## 2013-07-27 VITALS — BP 136/79 | HR 70 | Temp 97.7°F

## 2013-07-27 DIAGNOSIS — D499 Neoplasm of unspecified behavior of unspecified site: Secondary | ICD-10-CM

## 2013-07-27 DIAGNOSIS — C7A00 Malignant carcinoid tumor of unspecified site: Secondary | ICD-10-CM

## 2013-07-27 DIAGNOSIS — E340 Carcinoid syndrome: Secondary | ICD-10-CM

## 2013-07-27 MED ORDER — OCTREOTIDE ACETATE 30 MG IM KIT
30.0000 mg | PACK | Freq: Once | INTRAMUSCULAR | Status: AC
  Administered 2013-07-27: 30 mg via INTRAMUSCULAR

## 2013-07-27 NOTE — Telephone Encounter (Signed)
Pt moved 10-31 inj to 10-22 MD aware

## 2013-07-27 NOTE — Patient Instructions (Signed)

## 2013-08-21 ENCOUNTER — Encounter: Payer: Self-pay | Admitting: Internal Medicine

## 2013-08-22 ENCOUNTER — Ambulatory Visit (HOSPITAL_BASED_OUTPATIENT_CLINIC_OR_DEPARTMENT_OTHER): Payer: Medicare Other

## 2013-08-22 VITALS — BP 145/95 | HR 78 | Temp 97.2°F | Resp 16

## 2013-08-22 DIAGNOSIS — E340 Carcinoid syndrome: Secondary | ICD-10-CM

## 2013-08-22 DIAGNOSIS — C7A00 Malignant carcinoid tumor of unspecified site: Secondary | ICD-10-CM

## 2013-08-22 DIAGNOSIS — D499 Neoplasm of unspecified behavior of unspecified site: Secondary | ICD-10-CM

## 2013-08-22 MED ORDER — OCTREOTIDE ACETATE 30 MG IM KIT
30.0000 mg | PACK | Freq: Once | INTRAMUSCULAR | Status: AC
  Administered 2013-08-22: 30 mg via INTRAMUSCULAR

## 2013-08-31 ENCOUNTER — Ambulatory Visit: Payer: Medicare Other

## 2013-09-06 ENCOUNTER — Other Ambulatory Visit: Payer: Self-pay

## 2013-09-15 ENCOUNTER — Other Ambulatory Visit: Payer: Self-pay | Admitting: Internal Medicine

## 2013-09-23 ENCOUNTER — Other Ambulatory Visit: Payer: Self-pay | Admitting: Internal Medicine

## 2013-09-25 ENCOUNTER — Ambulatory Visit (INDEPENDENT_AMBULATORY_CARE_PROVIDER_SITE_OTHER): Payer: Medicare Other | Admitting: Internal Medicine

## 2013-09-25 ENCOUNTER — Encounter: Payer: Self-pay | Admitting: Internal Medicine

## 2013-09-25 VITALS — BP 136/76 | HR 80 | Ht 69.0 in | Wt 207.2 lb

## 2013-09-25 DIAGNOSIS — E340 Carcinoid syndrome: Secondary | ICD-10-CM

## 2013-09-25 DIAGNOSIS — K219 Gastro-esophageal reflux disease without esophagitis: Secondary | ICD-10-CM

## 2013-09-25 DIAGNOSIS — Z86010 Personal history of colonic polyps: Secondary | ICD-10-CM

## 2013-09-25 DIAGNOSIS — R141 Gas pain: Secondary | ICD-10-CM

## 2013-09-25 MED ORDER — METRONIDAZOLE 250 MG PO TABS: 250.0000 mg | ORAL_TABLET | Freq: Three times a day (TID) | ORAL | Status: DC

## 2013-09-25 NOTE — Progress Notes (Signed)
HISTORY OF PRESENT ILLNESS:  Brian Mays is a 67 y.o. male with a remote history of biliary pancreatitis complicated by pancreatic pseudocyst (1998) on pancreatic enzymes, status post cholecystectomy, hypertension, hyperlipidemia, GERD, and carcinoid syndrome diagnosed in 2010 for which she has undergone partial small bowel resection and remains on octreotide therapy. He was last seen in the office January 2010 complaining of epigastric discomfort and belching. Subsequent ultrasound diagnosed the carcinoid tumors in the liver. I have not seen him since. His chief complaint today is that of belching or burping. He states that he has had this for years, but got worse 7 or 8 weeks ago. He saw his dentist and was found to be hypertensive. Subsequently referred to cardiology. Negative cardiology workup thus far. Because of burping during his cardiac evaluation, GI evaluation recommended. The patient notices the problem after he wakes and begins to walk. He gained about 15 pounds. Over the past week or so he states symptoms have improved somewhat with weight loss and consumption of yogurt. He denies nausea, vomiting, heartburn, or abdominal pain. He is on Creon, lansoprazole, metoclopramide, and octreotide in addition to non-GI medications. His bowel habits are regular. He is observed intermittently swallowing and belching during the evaluation today. His last upper endoscopy in 2007 was unremarkable. His last colonoscopy in November of 2011 revealed a diminutive adenoma and mild diverticulosis. Otherwise negative including ileoscopy. Followup around November 2016 recommended  REVIEW OF SYSTEMS:  All non-GI ROS negative except for heart murmur  Past Medical History  Diagnosis Date  . GERD (gastroesophageal reflux disease)   . Pancreatitis 1998    chronic  . HTN (hypertension)   . Hemorrhoid   . Hypothyroidism   . Colon polyps   . Metastatic carcinoma 2010    Dr Twanna Hy  . Diverticulosis   .  Gallstones   . Hiatal hernia     Past Surgical History  Procedure Laterality Date  . Cholecystectomy  1999  . Carcinoil resection  01/2009  . Appendectomy  1964    Social History CADYN FANN  reports that he quit smoking about 15 years ago. His smoking use included Cigarettes. He smoked 0.00 packs per day. He has never used smokeless tobacco. He reports that he drinks alcohol. He reports that he does not use illicit drugs.  family history includes COPD in his father; Hyperlipidemia in his mother; Hypertension in his mother; Kidney disease in his mother; Ulcerative colitis in his daughter.  Allergies  Allergen Reactions  . Iohexol      Code: HIVES, Desc: PER [name] @ [facility], PT IS ALLERGIC TO CONTRAST DYE 10/02/08/RM  05/01/10...needs full premeds per our protocol w/ [facility]., Onset Date: [id number]        PHYSICAL EXAMINATION:  Vital signs: BP 136/76  Pulse 80  Ht 5\' 9"  (1.753 m)  Wt 207 lb 4 oz (94.008 kg)  BMI 30.59 kg/m2 General: Well-developed, well-nourished, no acute distress HEENT: Sclerae are anicteric, conjunctiva pink. Oral mucosa intact Lungs: Clear Heart: Regular Abdomen: soft, nontender, nondistended, no obvious ascites, no peritoneal signs, normal bowel sounds. No organomegaly. Extremities: No edema Psychiatric: alert and oriented x3. Cooperative  ASSESSMENT:  #1. Increased intestinal gas as manifested by belching. Chronic. Worse recently. No alarm features #2. Carcinoid syndrome. Stable. #3. GERD. Stable. #4. Remote history of pancreatitis #5. General medical problems   PLAN:  #1. Discussion on intestinal gas #2. Literature on intestinal gas as well as anti-gas and flatulence dietary sheet provided #3. Empiric  course of metronidazole 250 mg 3 times a day x10 days for possible bacterial overgrowth #4. Continue with weight loss #5. Surveillance colonoscopy around November 2016 #6. Interval GI followup as needed

## 2013-09-25 NOTE — Patient Instructions (Signed)
We have sent the following medications to your pharmacy for you to pick up at your convenience:  Metronidazole  You have been given some information on gas.

## 2013-09-28 ENCOUNTER — Other Ambulatory Visit (HOSPITAL_BASED_OUTPATIENT_CLINIC_OR_DEPARTMENT_OTHER): Payer: Medicare Other | Admitting: Lab

## 2013-09-28 ENCOUNTER — Ambulatory Visit (HOSPITAL_BASED_OUTPATIENT_CLINIC_OR_DEPARTMENT_OTHER): Payer: Medicare Other | Admitting: Hematology & Oncology

## 2013-09-28 ENCOUNTER — Ambulatory Visit (HOSPITAL_BASED_OUTPATIENT_CLINIC_OR_DEPARTMENT_OTHER): Payer: Medicare Other

## 2013-09-28 VITALS — BP 156/86 | HR 82 | Temp 97.8°F | Ht 70.0 in | Wt 205.0 lb

## 2013-09-28 DIAGNOSIS — E039 Hypothyroidism, unspecified: Secondary | ICD-10-CM

## 2013-09-28 DIAGNOSIS — D499 Neoplasm of unspecified behavior of unspecified site: Secondary | ICD-10-CM

## 2013-09-28 DIAGNOSIS — C787 Secondary malignant neoplasm of liver and intrahepatic bile duct: Secondary | ICD-10-CM

## 2013-09-28 DIAGNOSIS — C7A00 Malignant carcinoid tumor of unspecified site: Secondary | ICD-10-CM

## 2013-09-28 DIAGNOSIS — C670 Malignant neoplasm of trigone of bladder: Secondary | ICD-10-CM

## 2013-09-28 DIAGNOSIS — E340 Carcinoid syndrome: Secondary | ICD-10-CM

## 2013-09-28 DIAGNOSIS — Z23 Encounter for immunization: Secondary | ICD-10-CM

## 2013-09-28 LAB — TSH CHCC: TSH: 4.784 m(IU)/L — ABNORMAL HIGH (ref 0.320–4.118)

## 2013-09-28 LAB — CBC WITH DIFFERENTIAL (CANCER CENTER ONLY)
BASO#: 0 10*3/uL (ref 0.0–0.2)
Eosinophils Absolute: 0.2 10*3/uL (ref 0.0–0.5)
HCT: 42.5 % (ref 38.7–49.9)
HGB: 14 g/dL (ref 13.0–17.1)
LYMPH#: 1.2 10*3/uL (ref 0.9–3.3)
MCH: 28.5 pg (ref 28.0–33.4)
MONO#: 0.6 10*3/uL (ref 0.1–0.9)
NEUT#: 5.6 10*3/uL (ref 1.5–6.5)
RBC: 4.91 10*6/uL (ref 4.20–5.70)
WBC: 7.7 10*3/uL (ref 4.0–10.0)

## 2013-09-28 MED ORDER — INFLUENZA VAC SPLIT QUAD 0.5 ML IM SUSP
0.5000 mL | INTRAMUSCULAR | Status: AC
  Administered 2013-09-28: 0.5 mL via INTRAMUSCULAR
  Filled 2013-09-28: qty 0.5

## 2013-09-28 MED ORDER — OCTREOTIDE ACETATE 30 MG IM KIT
PACK | INTRAMUSCULAR | Status: AC
  Filled 2013-09-28: qty 1

## 2013-09-28 MED ORDER — OCTREOTIDE ACETATE 30 MG IM KIT
30.0000 mg | PACK | Freq: Once | INTRAMUSCULAR | Status: AC
  Administered 2013-09-28: 30 mg via INTRAMUSCULAR

## 2013-09-28 NOTE — Patient Instructions (Signed)
Octreotide injection solution What is this medicine? OCTREOTIDE (ok TREE oh tide) is used to reduce blood levels of growth hormone in patients with a condition called acromegaly. This medicine also reduces flushing and watery diarrhea caused by certain types of cancer. This medicine may be used for other purposes; ask your health care provider or pharmacist if you have questions. What should I tell my health care provider before I take this medicine? They need to know if you have any of these conditions: -gallbladder disease -kidney disease -liver disease -an unusual or allergic reaction to octreotide, other medicines, foods, dyes, or preservatives -pregnant or trying to get pregnant -breast-feeding How should I use this medicine? This medicine is for injection under the skin or into a vein (only in emergency situations). It is usually given by a health care professional in a hospital or clinic setting. If you get this medicine at home, you will be taught how to prepare and give this medicine. Allow the injection solution to come to room temperature before use. Do not warm it artificially. Use exactly as directed. Take your medicine at regular intervals. Do not take your medicine more often than directed. It is important that you put your used needles and syringes in a special sharps container. Do not put them in a trash can. If you do not have a sharps container, call your pharmacist or healthcare provider to get one. Talk to your pediatrician regarding the use of this medicine in children. Special care may be needed. Overdosage: If you think you have taken too much of this medicine contact a poison control center or emergency room at once. NOTE: This medicine is only for you. Do not share this medicine with others. What if I miss a dose? If you miss a dose, take it as soon as you can. If it is almost time for your next dose, take only that dose. Do not take double or extra doses. What may  interact with this medicine? Do not take this medicine with any of the following medications: -cisapride -droperidol -general anesthetics -grepafloxacin -perphenazine -thioridazine This medicine may also interact with the following medications: -bromocriptine -cyclosporine -diuretics -medicines for blood pressure, heart disease, irregular heart beat -medicines for diabetes, including insulin -quinidine This list may not describe all possible interactions. Give your health care provider a list of all the medicines, herbs, non-prescription drugs, or dietary supplements you use. Also tell them if you smoke, drink alcohol, or use illegal drugs. Some items may interact with your medicine. What should I watch for while using this medicine? Visit your doctor or health care professional for regular checks on your progress. To help reduce irritation at the injection site, use a different site for each injection and make sure the solution is at room temperature before use. This medicine may cause increases or decreases in blood sugar. Signs of high blood sugar include frequent urination, unusual thirst, flushed or dry skin, difficulty breathing, drowsiness, stomach ache, nausea, vomiting or dry mouth. Signs of low blood sugar include chills, cool, pale skin or cold sweats, drowsiness, extreme hunger, fast heartbeat, headache, nausea, nervousness or anxiety, shakiness, trembling, unsteadiness, tiredness, or weakness. Contact your doctor or health care professional right away if you experience any of these symptoms. What side effects may I notice from receiving this medicine? Side effects that you should report to your doctor or health care professional as soon as possible: -allergic reactions like skin rash, itching or hives, swelling of the face, lips, or tongue -  changes in blood sugar -changes in heart rate -severe stomach pain Side effects that usually do not require medical attention (report to  your doctor or health care professional if they continue or are bothersome): -diarrhea or constipation -gas or stomach pain -nausea, vomiting -pain, redness, swelling and irritation at site where injected This list may not describe all possible side effects. Call your doctor for medical advice about side effects. You may report side effects to FDA at 1-800-FDA-1088. Where should I keep my medicine? Keep out of the reach of children. Store in a refrigerator between 2 and 8 degrees C (36 and 46 degrees F). Protect from light. Allow to come to room temperature naturally. Do not use artificial heat. If protected from light, the injection may be stored at room temperature between 20 and 30 degrees C (70 and 86 degrees F) for 14 days. After the initial use, throw away any unused portion of a multiple dose vial after 14 days. Throw away unused portions of the ampules after use. NOTE: This sheet is a summary. It may not cover all possible information. If you have questions about this medicine, talk to your doctor, pharmacist, or health care provider.  2013, Elsevier/Gold Standard. (05/14/2008 4:56:04 PM)   Influenza Virus Vaccine (Flucelvax) What is this medicine? INFLUENZA VIRUS VACCINE (in floo EN zuh VAHY ruhs vak SEEN) helps to reduce the risk of getting influenza also known as the flu. The vaccine only helps protect you against some strains of the flu. This medicine may be used for other purposes; ask your health care provider or pharmacist if you have questions. COMMON BRAND NAME(S): FLUCELVAX What should I tell my health care provider before I take this medicine? They need to know if you have any of these conditions: -bleeding disorder like hemophilia -fever or infection -Guillain-Barre syndrome or other neurological problems -immune system problems -infection with the human immunodeficiency virus (HIV) or AIDS -low blood platelet counts -multiple sclerosis -an unusual or allergic reaction  to influenza virus vaccine, other medicines, foods, dyes or preservatives -pregnant or trying to get pregnant -breast-feeding How should I use this medicine? This vaccine is for injection into a muscle. It is given by a health care professional. A copy of Vaccine Information Statements will be given before each vaccination. Read this sheet carefully each time. The sheet may change frequently. Talk to your pediatrician regarding the use of this medicine in children. Special care may be needed. Overdosage: If you think you've taken too much of this medicine contact a poison control center or emergency room at once. Overdosage: If you think you have taken too much of this medicine contact a poison control center or emergency room at once. NOTE: This medicine is only for you. Do not share this medicine with others. What if I miss a dose? This does not apply. What may interact with this medicine? -chemotherapy or radiation therapy -medicines that lower your immune system like etanercept, anakinra, infliximab, and adalimumab -medicines that treat or prevent blood clots like warfarin -phenytoin -steroid medicines like prednisone or cortisone -theophylline -vaccines This list may not describe all possible interactions. Give your health care provider a list of all the medicines, herbs, non-prescription drugs, or dietary supplements you use. Also tell them if you smoke, drink alcohol, or use illegal drugs. Some items may interact with your medicine. What should I watch for while using this medicine? Report any side effects that do not go away within 3 days to your doctor or health care  professional. Call your health care provider if any unusual symptoms occur within 6 weeks of receiving this vaccine. You may still catch the flu, but the illness is not usually as bad. You cannot get the flu from the vaccine. The vaccine will not protect against colds or other illnesses that may cause fever. The vaccine  is needed every year. What side effects may I notice from receiving this medicine? Side effects that you should report to your doctor or health care professional as soon as possible: -allergic reactions like skin rash, itching or hives, swelling of the face, lips, or tongue Side effects that usually do not require medical attention (Report these to your doctor or health care professional if they continue or are bothersome.): -fever -headache -muscle aches and pains -pain, tenderness, redness, or swelling at the injection site -tiredness This list may not describe all possible side effects. Call your doctor for medical advice about side effects. You may report side effects to FDA at 1-800-FDA-1088. Where should I keep my medicine? The vaccine will be given by a health care professional in a clinic, pharmacy, doctor's office, or other health care setting. You will not be given vaccine doses to store at home. NOTE: This sheet is a summary. It may not cover all possible information. If you have questions about this medicine, talk to your doctor, pharmacist, or health care provider.  2014, Elsevier/Gold Standard. (2011-09-29 14:06:47)

## 2013-10-01 NOTE — Progress Notes (Signed)
This office note has been dictated.

## 2013-10-02 ENCOUNTER — Encounter: Payer: Self-pay | Admitting: *Deleted

## 2013-10-05 LAB — COMPREHENSIVE METABOLIC PANEL
AST: 33 U/L (ref 0–37)
BUN: 15 mg/dL (ref 6–23)
Calcium: 9.3 mg/dL (ref 8.4–10.5)
Chloride: 103 mEq/L (ref 96–112)
Creatinine, Ser: 0.87 mg/dL (ref 0.50–1.35)
Glucose, Bld: 71 mg/dL (ref 70–99)

## 2013-10-05 LAB — CHROMOGRANIN A: Chromogranin A: 23 ng/mL — ABNORMAL HIGH (ref 1.9–15.0)

## 2013-10-07 NOTE — Progress Notes (Signed)
DIAGNOSIS:  Metastatic carcinoid with hepatic metastasis.  CURRENT THERAPY:  Sandostatin LAR 3 mg IM monthly.  INTERIM HISTORY:  Brian Mays comes in for followup.  We see him every 3-4 months.  He has been doing fairly well.  He has had no real problems since we last saw him.  He has had some slight fatigue.  He is on Synthroid now.  He has had no fevers, sweats, or chills.  There has been no abdominal pain.  He has had no change in bowel or bladder habits.  Initially when I last saw him back in August, his Chromogranin A level was 7.6.  He has had no rashes.  He has had no flushing.  He has had no shortness of breath.  There has been no leg swelling.  They did go to [place] earlier in November.  He had a good time there. He played on some tough golf courses.  PHYSICAL EXAMINATION:  General:  This is a well-developed, well- nourished white gentleman, in no obvious distress.  Vital Signs: Temperature of 97.8, pulse 82, respiratory rate 18, blood pressure 156/86, weight is 205 pounds.  Head and Neck Exam:  Normocephalic, atraumatic skull.  There are no ocular or oral lesions.  There are no palpable cervical or supraclavicular lymph nodes.  Lungs:  Clear bilaterally.  Cardiac Exam:  Regular rate and rhythm with a normal S1, S2.  There are no murmurs, rubs, or bruits.  Abdomen:  Soft.  He has good bowel sounds.  There is no fluid wave.  There is no palpable abdominal mass.  There is no palpable hepatosplenomegaly.  Back Exam: No tenderness over the spine, ribs, or hips.  Extremities:  No clubbing, cyanosis or edema.  LABORATORY STUDIES:  White cell count 7.7, hemoglobin 14, hematocrit 43, platelet count 193.  TSH is 4.8.  IMPRESSION:  Brian Mays is a 67 year old gentleman with a metastatic carcinoid.  So far, he has been asymptomatic.  His Chromogranin A level has been holding fairly low.  I do not see any issues from my point of view right now.  I do not see that we have to do  any scans on him.  We will go ahead and plan to get him back to see [place] in another couple months or so.  He gets his Sandostatin monthly which I think has really helped him out.    ______________________________ Josph Macho, M.D. PRE/MEDQ  D:  10/01/2013  T:  10/06/2013  Job:  [id number]

## 2013-10-13 ENCOUNTER — Other Ambulatory Visit: Payer: Self-pay | Admitting: Internal Medicine

## 2013-10-26 ENCOUNTER — Ambulatory Visit (HOSPITAL_BASED_OUTPATIENT_CLINIC_OR_DEPARTMENT_OTHER): Payer: Medicare Other

## 2013-10-26 ENCOUNTER — Telehealth: Payer: Self-pay | Admitting: Hematology & Oncology

## 2013-10-26 VITALS — BP 135/80 | HR 77 | Temp 97.2°F | Resp 20

## 2013-10-26 DIAGNOSIS — E340 Carcinoid syndrome: Secondary | ICD-10-CM

## 2013-10-26 DIAGNOSIS — C7A00 Malignant carcinoid tumor of unspecified site: Secondary | ICD-10-CM

## 2013-10-26 DIAGNOSIS — D499 Neoplasm of unspecified behavior of unspecified site: Secondary | ICD-10-CM

## 2013-10-26 MED ORDER — OCTREOTIDE ACETATE 30 MG IM KIT
30.0000 mg | PACK | Freq: Once | INTRAMUSCULAR | Status: AC
  Administered 2013-10-26: 30 mg via INTRAMUSCULAR

## 2013-10-26 NOTE — Patient Instructions (Signed)

## 2013-10-26 NOTE — Telephone Encounter (Signed)
Per scheduling pt needs 13 hr prep for allergic reaction to last CT IV contrast. Pt has prescriptions and is aware MD wants him to have a driver

## 2013-10-26 NOTE — Addendum Note (Signed)
Addended by: Arlan Organ R on: 10/26/2013 10:41 AM   Modules accepted: Orders

## 2013-10-30 ENCOUNTER — Ambulatory Visit (HOSPITAL_COMMUNITY)
Admission: RE | Admit: 2013-10-30 | Discharge: 2013-10-30 | Disposition: A | Discharge status: Home / Self Care | Payer: Medicare Other | Source: Ambulatory Visit | Attending: Hematology & Oncology | Admitting: Hematology & Oncology

## 2013-10-30 DIAGNOSIS — D499 Neoplasm of unspecified behavior of unspecified site: Secondary | ICD-10-CM

## 2013-10-30 DIAGNOSIS — R1909 Other intra-abdominal and pelvic swelling, mass and lump: Secondary | ICD-10-CM | POA: Insufficient documentation

## 2013-10-30 DIAGNOSIS — N3289 Other specified disorders of bladder: Secondary | ICD-10-CM | POA: Insufficient documentation

## 2013-10-30 DIAGNOSIS — C787 Secondary malignant neoplasm of liver and intrahepatic bile duct: Secondary | ICD-10-CM | POA: Insufficient documentation

## 2013-10-30 DIAGNOSIS — C7A00 Malignant carcinoid tumor of unspecified site: Secondary | ICD-10-CM | POA: Insufficient documentation

## 2013-10-30 DIAGNOSIS — R978 Other abnormal tumor markers: Secondary | ICD-10-CM | POA: Insufficient documentation

## 2013-10-30 DIAGNOSIS — I7090 Unspecified atherosclerosis: Secondary | ICD-10-CM | POA: Insufficient documentation

## 2013-10-30 IMAGING — CT CT ABD-PELV W/ CM
2 of 8 series · 13 of 46 positions shown, 18 images · IV contrast (OMNIPAQUE)
Comparison: PET-CT [DATE] and CT abdomen [DATE].

CLINICAL DATA: Status post [AGE] treatment 4 intestinal carcinoid
with liver metastasis and [SJ] and [SJ]. Elevated tumor markers.

EXAM:
CT ABDOMEN AND PELVIS WITH CONTRAST
TECHNIQUE: Multidetector CT imaging of the abdomen and pelvis was performed
using the standard protocol following bolus administration of
intravenous contrast.
CONTRAST:  100mL OMNIPAQUE IOHEXOL 300 MG/ML  SOLN

[Series 4: liver portal venous · axial · portal-venous · 0.77mm/px · z∈[+714,+1104]mm · 10 of 154 slices shown, 15 images]
[im 12/154  soft-tissue]
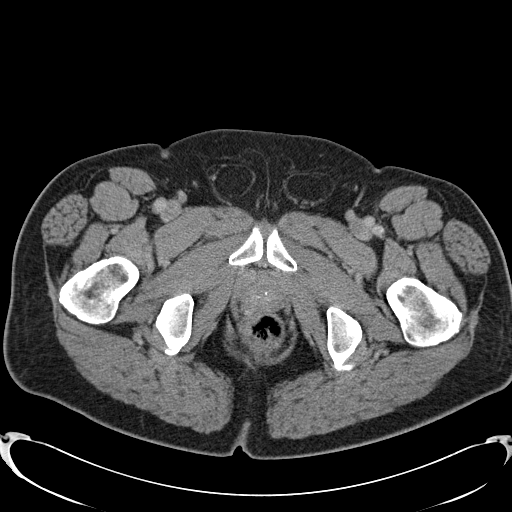
[im 12/154  bone]
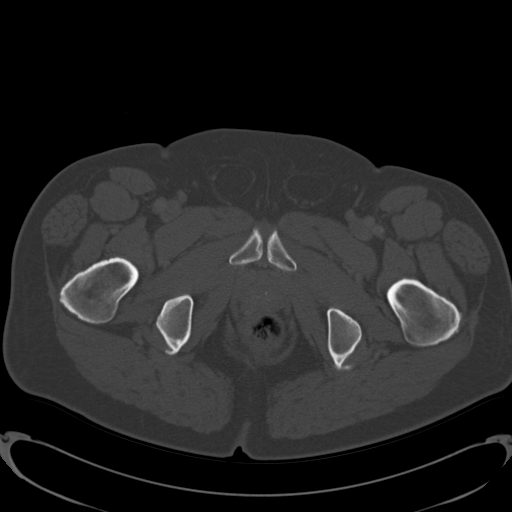
[im 36/154  soft-tissue]
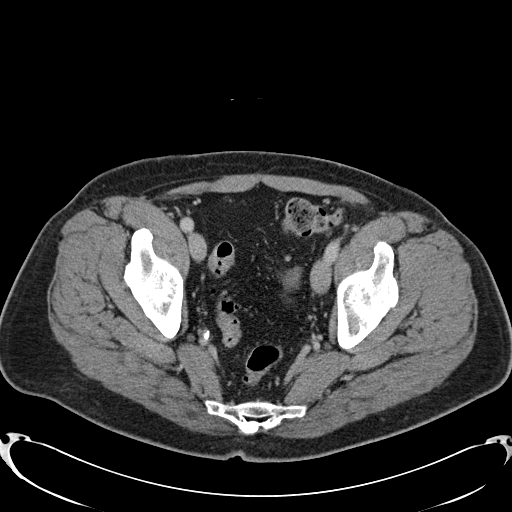
[im 48/154  soft-tissue]
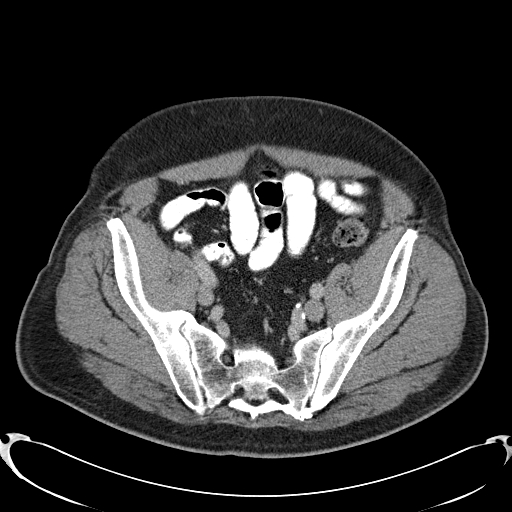
[im 59/154  soft-tissue]
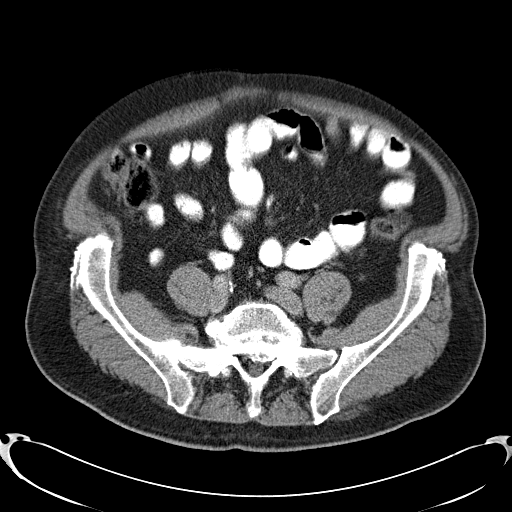
[im 83/154  soft-tissue]
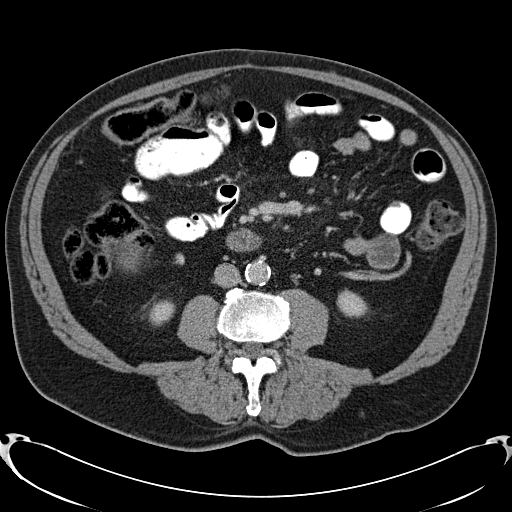
[im 95/154  soft-tissue]
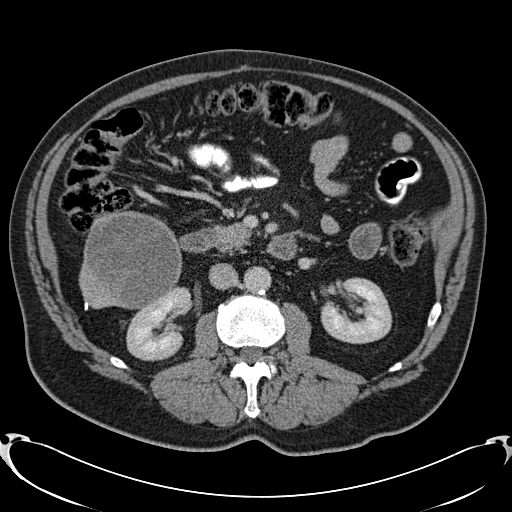
[im 106/154  soft-tissue]
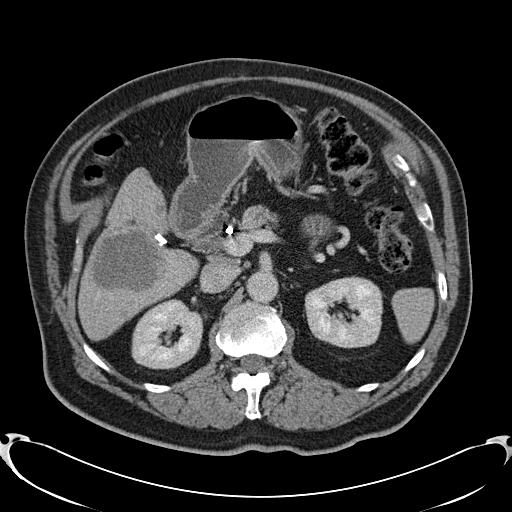
[im 106/154  lung]
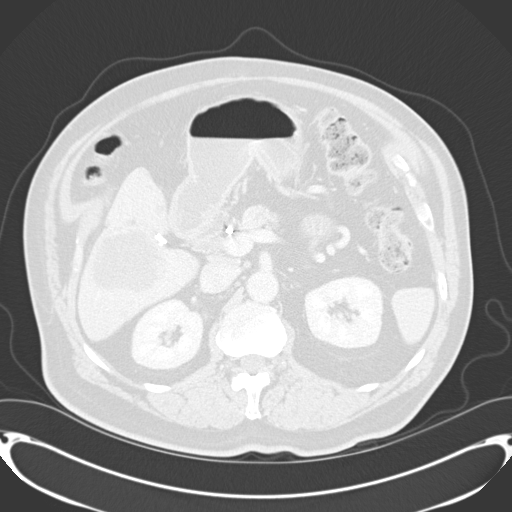
[im 118/154  lung]
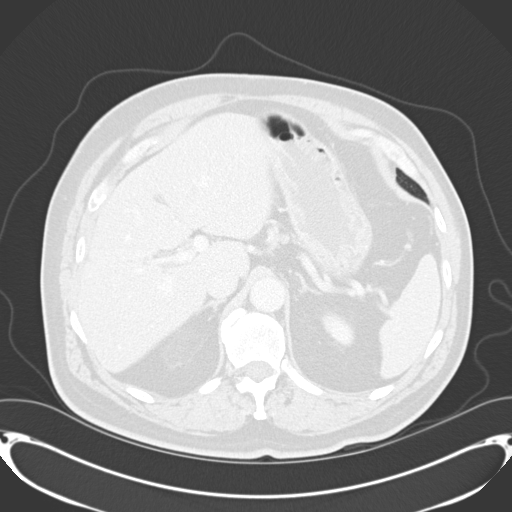
[im 130/154  soft-tissue]
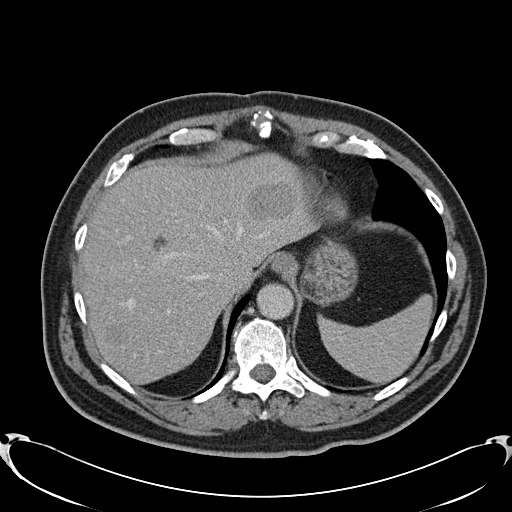
[im 130/154  lung]
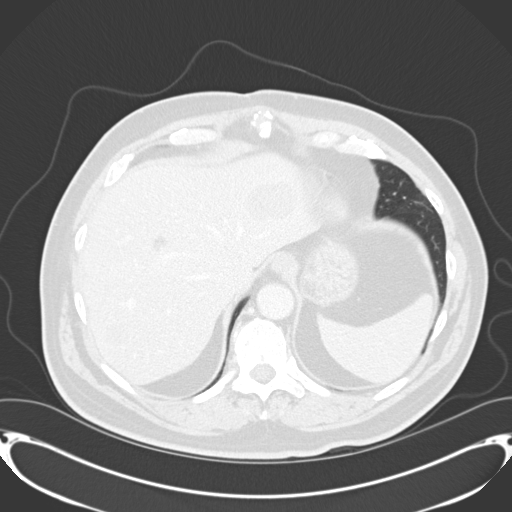
[im 142/154  soft-tissue]
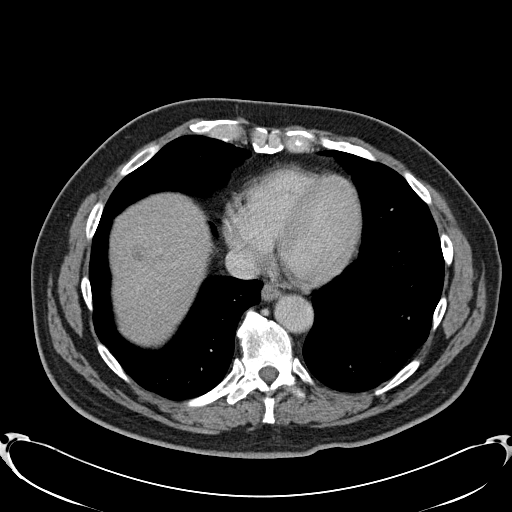
[im 142/154  lung]
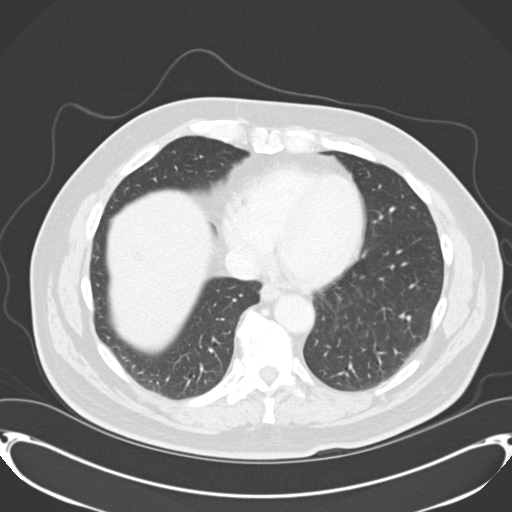
[im 142/154  bone]
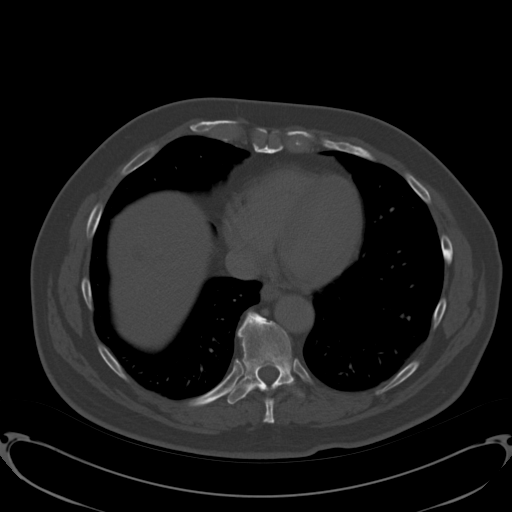

[Series 602: coronal arterial · coronal · arterial · 0.77mm/px · 3 of 146 slices shown]
[im 37/146  soft-tissue]
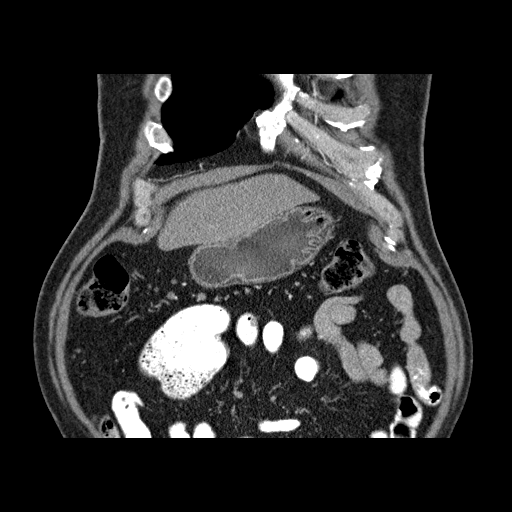
[im 73/146  soft-tissue]
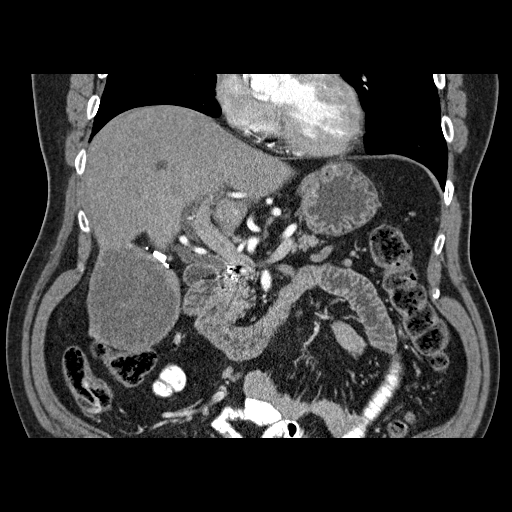
[im 109/146  soft-tissue]
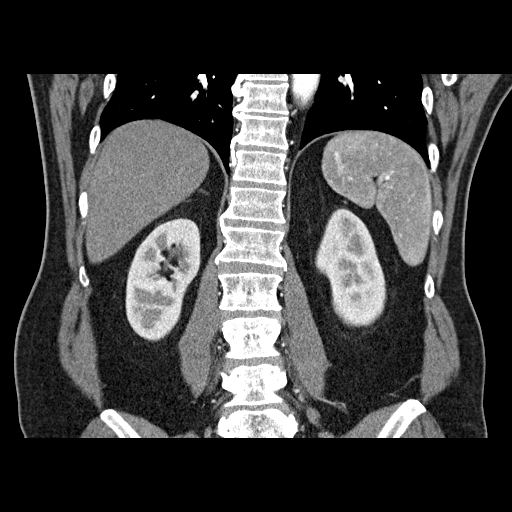

[13 of 46 positions shown; findings below may reference images not displayed]

FINDINGS: The lung bases are clear. No worrisome pulmonary nodules, infiltrate
or effusion.

There are recurrent metastatic hepatic lesions. The large dominant
previously treated lesion in the inferior aspect of the right
hepatic lobe is stable at 7 cm. There are several recurrent
metastatic lesions when looking back to a prior study from [SJ]. A
left hepatic lobe lesion on image number 25 measures 4.0 x 2.9 cm.
Right hepatic lobe lesion adjacent to the middle portal vein on
image number 30 measures 3.2 x 2.9 cm. Right hepatic lobe lesion on
image number 26 measures 1.5 x 1.9 cm and a right hepatic lobe
lesion at the dome on image number 12 measures 13 x 13 mm. A few
other smaller lesions are noted.

The spleen is normal in size. No focal lesions. The pancreas is
normal and stable. The adrenal glands and kidneys are normal. No
mesenteric or retroperitoneal adenopathy. The calcified mesenteric
mass on image number 81 is stable measuring 3.1 x 2.3 cm. Coils are
again noted in the gastroduodenal artery. The aorta is normal in
caliber. Stable atherosclerotic calcifications. The branch vessels
are patent.

The stomach, duodenum, small bowel and colon are unremarkable. No
inflammatory changes, mass lesions or obstructive findings. No free
abdominal/pelvic fluid collections. The bladder is not well
distended. Mild diffuse wall thickening. The prostate gland and
seminal vesicles are unremarkable. No pelvic mass or adenopathy.
Lesion in the right buttock area is most likely a injection
granuloma.

The bony structures are unremarkable. Stable degenerative changes
involving the spine.
IMPRESSION: Recurrent metastatic hepatic lesions in both lobes.

Stable calcified mesenteric mass. No new mesenteric or
retroperitoneal lymphadenopathy.

## 2013-10-30 MED ORDER — IOHEXOL 300 MG/ML  SOLN
100.0000 mL | Freq: Once | INTRAMUSCULAR | Status: AC | PRN
  Administered 2013-10-30: 100 mL via INTRAVENOUS

## 2013-11-09 ENCOUNTER — Telehealth: Payer: Self-pay | Admitting: Hematology & Oncology

## 2013-11-09 NOTE — Telephone Encounter (Signed)
Faxed Medical Records via fax today  to:  [facility] [place]  Ph: [phone] Fx: [phone]  Medical  Records requested from last 5 years to present   [place] SCANNED

## 2013-11-13 ENCOUNTER — Other Ambulatory Visit: Payer: Self-pay | Admitting: Hematology & Oncology

## 2013-11-13 ENCOUNTER — Telehealth: Payer: Self-pay | Admitting: Hematology & Oncology

## 2013-11-13 DIAGNOSIS — D499 Neoplasm of unspecified behavior of unspecified site: Secondary | ICD-10-CM

## 2013-11-13 NOTE — Telephone Encounter (Signed)
Per Md and Charlean Sanfilippo to sch lab/md apt for 11/14/13 at 12:15

## 2013-11-14 ENCOUNTER — Encounter: Payer: Self-pay | Admitting: Hematology & Oncology

## 2013-11-14 ENCOUNTER — Telehealth: Payer: Self-pay | Admitting: Hematology & Oncology

## 2013-11-14 ENCOUNTER — Ambulatory Visit (HOSPITAL_BASED_OUTPATIENT_CLINIC_OR_DEPARTMENT_OTHER): Payer: Medicare PPO | Admitting: Hematology & Oncology

## 2013-11-14 ENCOUNTER — Other Ambulatory Visit: Payer: Self-pay | Admitting: *Deleted

## 2013-11-14 ENCOUNTER — Other Ambulatory Visit (HOSPITAL_BASED_OUTPATIENT_CLINIC_OR_DEPARTMENT_OTHER): Payer: Medicare PPO | Admitting: Lab

## 2013-11-14 VITALS — BP 125/74 | HR 75 | Temp 98.3°F | Resp 18 | Ht 71.0 in | Wt 208.0 lb

## 2013-11-14 DIAGNOSIS — D499 Neoplasm of unspecified behavior of unspecified site: Secondary | ICD-10-CM

## 2013-11-14 DIAGNOSIS — F329 Major depressive disorder, single episode, unspecified: Secondary | ICD-10-CM

## 2013-11-14 DIAGNOSIS — C7A00 Malignant carcinoid tumor of unspecified site: Secondary | ICD-10-CM

## 2013-11-14 DIAGNOSIS — C787 Secondary malignant neoplasm of liver and intrahepatic bile duct: Secondary | ICD-10-CM

## 2013-11-14 DIAGNOSIS — F419 Anxiety disorder, unspecified: Principal | ICD-10-CM

## 2013-11-14 DIAGNOSIS — F32A Depression, unspecified: Secondary | ICD-10-CM

## 2013-11-14 LAB — CBC WITH DIFFERENTIAL (CANCER CENTER ONLY)
BASO#: 0 10*3/uL (ref 0.0–0.2)
BASO%: 0.4 % (ref 0.0–2.0)
EOS%: 1.9 % (ref 0.0–7.0)
Eosinophils Absolute: 0.2 10*3/uL (ref 0.0–0.5)
HEMATOCRIT: 41.4 % (ref 38.7–49.9)
HGB: 13.4 g/dL (ref 13.0–17.1)
LYMPH#: 1.1 10*3/uL (ref 0.9–3.3)
LYMPH%: 12.8 % — ABNORMAL LOW (ref 14.0–48.0)
MCH: 28 pg (ref 28.0–33.4)
MCHC: 32.4 g/dL (ref 32.0–35.9)
MCV: 86 fL (ref 82–98)
MONO#: 0.6 10*3/uL (ref 0.1–0.9)
MONO%: 6.5 % (ref 0.0–13.0)
NEUT#: 7 10*3/uL — ABNORMAL HIGH (ref 1.5–6.5)
NEUT%: 78.4 % (ref 40.0–80.0)
Platelets: 206 10*3/uL (ref 145–400)
RBC: 4.79 10*6/uL (ref 4.20–5.70)
RDW: 14.3 % (ref 11.1–15.7)
WBC: 8.9 10*3/uL (ref 4.0–10.0)

## 2013-11-14 MED ORDER — SUCRALFATE 1 GM/10ML PO SUSP
1.0000 g | Freq: Three times a day (TID) | ORAL | Status: DC
Start: 2013-11-14 — End: 2013-11-21

## 2013-11-14 MED ORDER — PAROXETINE HCL 10 MG PO TABS: 10.0000 mg | ORAL_TABLET | Freq: Every day | ORAL | Status: DC

## 2013-11-14 NOTE — Telephone Encounter (Signed)
Tiffany from IR called checking on biopsy order. Per MD he does want one. She sent order to schedulers to put in review and to schedule

## 2013-11-14 NOTE — Telephone Encounter (Signed)
Spoke w Stanton Kidney today at PCP to initiate the referral process for [employer] member. Pt needs to bring update 2015 card.  PCP:  [place], [place]

## 2013-11-14 NOTE — Progress Notes (Signed)
This office note has been dictated.

## 2013-11-15 ENCOUNTER — Other Ambulatory Visit: Payer: Self-pay | Admitting: Radiology

## 2013-11-15 ENCOUNTER — Other Ambulatory Visit (HOSPITAL_COMMUNITY): Payer: Self-pay | Admitting: Radiology

## 2013-11-16 NOTE — Progress Notes (Signed)
CC:   [facility], Fax [phone]  DIAGNOSIS:  Progressive carcinoid.  CURRENT THERAPY:  Sandostatin LAR 30 mg IM monthly.  INTERIM HISTORY:  Brian Mays comes in for his followup.  He was complaining of some abdominal pain back in December.  I initially saw him back in November.  When we saw him in November, I noted that his chromogranin A level up to 23.  We followed this.  Again, he was having some abdominal pain in December. He was having a lot of gas.  He had been on, I think, Prevacid and Reglan.  His family had gone to [place] I think in October.  He had a good time there and had no problems.  We did go ahead and get a repeat CT scan on him.  The last time he had a CT scan was back in January of 2012.  Unfortunately, the CT scan did show that he had recurrent disease. There was no recurrence outside the liver as far as I can tell.  He had a stable mesenteric mass that was 3.1 x 2.3 cm.  He has had no problems with cough.  His wife says he has been very "moody."  This seems to run in his family.  As such, I will put him on some Paxil (10 mg p.o. daily).  He has had no problem with diarrhea.  He has had no flushing.  He has had no wheezing.  There has been no weight loss.  Overall, his performance status is ECOG 0.  PHYSICAL EXAMINATION:  General:  This is a well-developed, well- nourished white gentleman in no obvious distress.  Vital Signs: Temperature of 98.3, pulse 75, respiratory rate 18, blood pressure 125/74.  Weight is 208 pounds.  Head and Neck:  Normocephalic, atraumatic skull.  There are no ocular or oral lesions.  He has no palpable cervical or supraclavicular lymph nodes.  Lungs:  Clear bilaterally.  Cardiac:  Regular rate and rhythm with a normal S1 and S2. There are no murmurs, rubs, or bruits.  Abdomen:  Soft.  He has good bowel sounds.  There is no palpable abdominal mass.  There is no fluid wave.  There is no palpable hepatosplenomegaly.   Back:  No tenderness over the spine, ribs, or hips.  Extremities:  Show no clubbing, cyanosis, or edema.  Neurological:  Shows no focal neurological deficits.  Skin:  No rashes, ecchymoses, or petechia.  LABORATORY STUDIES:  Show a white cell count of 8.9, hemoglobin 13.4, hematocrit 41.4, platelet count 206.  IMPRESSION:  Brian Mays is a nice 68 year old gentleman.  We have been following him now with his metastatic carcinoid for probably 7 or 8 years.  He did have intrahepatic therapy back in the summer of 2011.  I really think that another intervention with intrahepatic therapy would be an excellent way of controlling this.  Again, he responded well to the yttrium-90 therapy.  I do not see why he would not respond now.  I did speak with the radiologist.  The one question might be the amount of radiation he got before.  If, for some reason, he is not a candidate for any intrahepatic therapy, then we are probably going to have to consider adding actual targeted therapy.  We can certainly go with Afinitor.  Also, we could use Sutent. These both now approved for carcinoid.  I do not see that he has any issues with carcinoid syndrome, so we do not need to adjust the octreotide  dose.  I spent a good 45 minutes with him and his wife.  I explained what was going on.  I reviewed the CT scans with him.  He certainly understands what is going.  I suspect that it will be another 4-6 weeks before he gets the intrahepatic therapy.  I will plan to see him back myself in February.    ______________________________ Volanda Napoleon, M.D. PRE/MEDQ  D:  11/14/2013  T:  11/15/2013  Job:  [id number]

## 2013-11-20 ENCOUNTER — Telehealth: Payer: Self-pay | Admitting: Hematology & Oncology

## 2013-11-20 ENCOUNTER — Ambulatory Visit
Admission: RE | Admit: 2013-11-20 | Discharge: 2013-11-20 | Disposition: A | Discharge status: Home / Self Care | Payer: Medicare PPO | Source: Ambulatory Visit | Attending: Hematology & Oncology | Admitting: Hematology & Oncology

## 2013-11-20 DIAGNOSIS — D499 Neoplasm of unspecified behavior of unspecified site: Secondary | ICD-10-CM

## 2013-11-20 NOTE — Telephone Encounter (Signed)
Pt aware of 1-23 lab and inj at [facility] before his procedure. Dr. Marin Olp aware and wants pt to still get those on friday

## 2013-11-20 NOTE — Progress Notes (Signed)
Denies nausea, vomiting or diarrhea.  Denies abdominal discomfort.    Sandostatin monthly.    Kindal Ponti Riki Rusk, RN 11/20/2013 11:36 AM

## 2013-11-21 ENCOUNTER — Encounter (HOSPITAL_COMMUNITY): Payer: Self-pay | Admitting: Pharmacy Technician

## 2013-11-21 ENCOUNTER — Other Ambulatory Visit (HOSPITAL_COMMUNITY): Payer: Self-pay | Admitting: Interventional Radiology

## 2013-11-21 ENCOUNTER — Telehealth: Payer: Self-pay | Admitting: Internal Medicine

## 2013-11-21 ENCOUNTER — Other Ambulatory Visit: Payer: Self-pay | Admitting: Radiology

## 2013-11-21 DIAGNOSIS — D499 Neoplasm of unspecified behavior of unspecified site: Secondary | ICD-10-CM

## 2013-11-21 DIAGNOSIS — C7A00 Malignant carcinoid tumor of unspecified site: Secondary | ICD-10-CM

## 2013-11-21 DIAGNOSIS — Y907 Blood alcohol level of 200-239 mg/100 ml: Secondary | ICD-10-CM

## 2013-11-21 LAB — LACTATE DEHYDROGENASE: LDH: 227 U/L (ref 94–250)

## 2013-11-21 LAB — COMPREHENSIVE METABOLIC PANEL
ALT: 16 U/L (ref 0–53)
AST: 23 U/L (ref 0–37)
Albumin: 3.8 g/dL (ref 3.5–5.2)
Alkaline Phosphatase: 57 U/L (ref 39–117)
BUN: 15 mg/dL (ref 6–23)
CO2: 26 mEq/L (ref 19–32)
CREATININE: 0.95 mg/dL (ref 0.50–1.35)
Calcium: 9.1 mg/dL (ref 8.4–10.5)
Chloride: 102 mEq/L (ref 96–112)
Glucose, Bld: 100 mg/dL — ABNORMAL HIGH (ref 70–99)
Potassium: 4.2 mEq/L (ref 3.5–5.3)
Sodium: 138 mEq/L (ref 135–145)
Total Bilirubin: 0.6 mg/dL (ref 0.3–1.2)
Total Protein: 7.3 g/dL (ref 6.0–8.3)

## 2013-11-21 LAB — PROTHROMBIN TIME
INR: 0.99 (ref <1.50)
PROTHROMBIN TIME: 13 s (ref 11.6–15.2)

## 2013-11-21 LAB — CHROMOGRANIN A: Chromogranin A: 18 ng/mL — ABNORMAL HIGH (ref 1.9–15.0)

## 2013-11-21 LAB — APTT: APTT: 29 s (ref 24–37)

## 2013-11-21 NOTE — Telephone Encounter (Signed)
Done. Thx.

## 2013-11-21 NOTE — Telephone Encounter (Signed)
Dr. Marin Olp has ordered an Ultra Sound for progressive carcinoid malignancy.  He has humana.  He is doing it at [facility] on Jan 23.  Dr. Barnie Mort NPI [id number].  Their fax is [phone].  The dx code is 239.9 the cpt for U/S is [phone].

## 2013-11-22 ENCOUNTER — Other Ambulatory Visit (HOSPITAL_COMMUNITY): Payer: Self-pay | Admitting: Interventional Radiology

## 2013-11-22 ENCOUNTER — Ambulatory Visit (HOSPITAL_COMMUNITY): Payer: Medicare Other

## 2013-11-22 DIAGNOSIS — C787 Secondary malignant neoplasm of liver and intrahepatic bile duct: Secondary | ICD-10-CM

## 2013-11-22 DIAGNOSIS — C7A1 Malignant poorly differentiated neuroendocrine tumors: Secondary | ICD-10-CM

## 2013-11-23 ENCOUNTER — Encounter (HOSPITAL_COMMUNITY): Payer: Self-pay

## 2013-11-23 ENCOUNTER — Ambulatory Visit: Payer: Medicare Other | Admitting: Hematology & Oncology

## 2013-11-23 ENCOUNTER — Ambulatory Visit (HOSPITAL_BASED_OUTPATIENT_CLINIC_OR_DEPARTMENT_OTHER): Payer: Medicare PPO

## 2013-11-23 ENCOUNTER — Ambulatory Visit (HOSPITAL_COMMUNITY)
Admission: RE | Admit: 2013-11-23 | Discharge: 2013-11-23 | Disposition: A | Discharge status: Home / Self Care | Payer: Medicare PPO | Source: Ambulatory Visit | Attending: Hematology & Oncology | Admitting: Hematology & Oncology

## 2013-11-23 ENCOUNTER — Ambulatory Visit: Payer: Medicare Other

## 2013-11-23 ENCOUNTER — Other Ambulatory Visit: Payer: Medicare Other | Admitting: Lab

## 2013-11-23 ENCOUNTER — Other Ambulatory Visit: Payer: Medicare PPO

## 2013-11-23 ENCOUNTER — Other Ambulatory Visit: Payer: Self-pay | Admitting: Hematology & Oncology

## 2013-11-23 ENCOUNTER — Ambulatory Visit: Payer: Medicare PPO

## 2013-11-23 ENCOUNTER — Other Ambulatory Visit: Payer: Self-pay | Admitting: Interventional Radiology

## 2013-11-23 VITALS — BP 136/85 | HR 72 | Temp 98.2°F

## 2013-11-23 DIAGNOSIS — E039 Hypothyroidism, unspecified: Secondary | ICD-10-CM | POA: Insufficient documentation

## 2013-11-23 DIAGNOSIS — C7B8 Other secondary neuroendocrine tumors: Principal | ICD-10-CM

## 2013-11-23 DIAGNOSIS — C7A00 Malignant carcinoid tumor of unspecified site: Secondary | ICD-10-CM | POA: Insufficient documentation

## 2013-11-23 DIAGNOSIS — E340 Carcinoid syndrome: Secondary | ICD-10-CM

## 2013-11-23 DIAGNOSIS — D499 Neoplasm of unspecified behavior of unspecified site: Secondary | ICD-10-CM

## 2013-11-23 DIAGNOSIS — C787 Secondary malignant neoplasm of liver and intrahepatic bile duct: Secondary | ICD-10-CM | POA: Insufficient documentation

## 2013-11-23 DIAGNOSIS — C7A8 Other malignant neuroendocrine tumors: Secondary | ICD-10-CM

## 2013-11-23 DIAGNOSIS — K219 Gastro-esophageal reflux disease without esophagitis: Secondary | ICD-10-CM | POA: Insufficient documentation

## 2013-11-23 DIAGNOSIS — Z79899 Other long term (current) drug therapy: Secondary | ICD-10-CM | POA: Insufficient documentation

## 2013-11-23 DIAGNOSIS — Z87891 Personal history of nicotine dependence: Secondary | ICD-10-CM | POA: Insufficient documentation

## 2013-11-23 DIAGNOSIS — Z7982 Long term (current) use of aspirin: Secondary | ICD-10-CM | POA: Insufficient documentation

## 2013-11-23 DIAGNOSIS — I10 Essential (primary) hypertension: Secondary | ICD-10-CM | POA: Insufficient documentation

## 2013-11-23 IMAGING — US US BIOPSY
1 series · 10 of 10 positions shown · non-contrast
Comparison: none

CLINICAL DATA: Recurrent metastatic neuroendocrine carcinoma to the
liver. Biopsy has been requested to reassess histology of the
metastatic tumor prior to additional treatment.

[Series 1: us biopsy · 0.22mm/px · 10 of 10 slices shown]
[im 1/10]
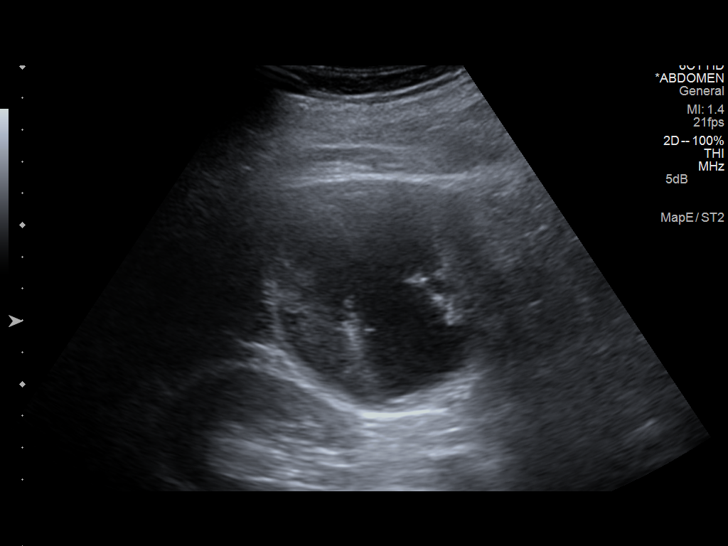
[im 2/10]
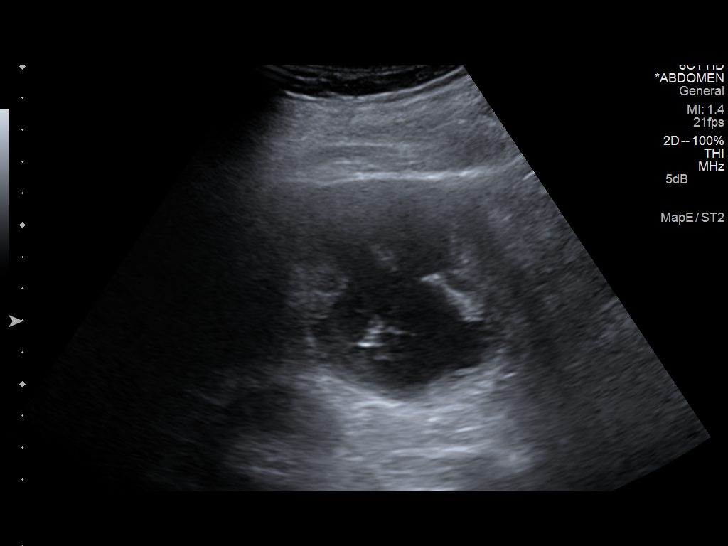
[im 3/10]
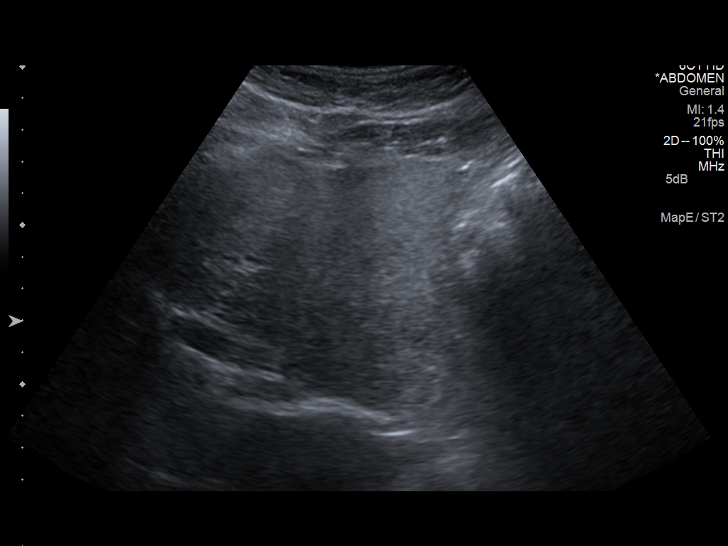
[im 4/10]
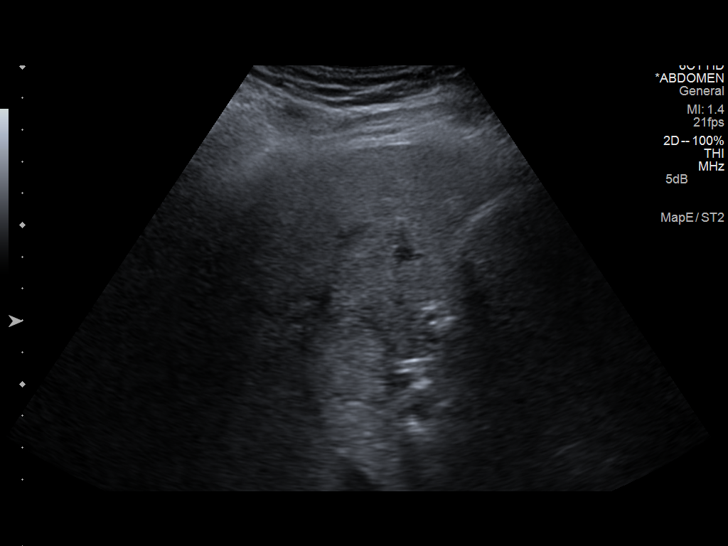
[im 5/10]
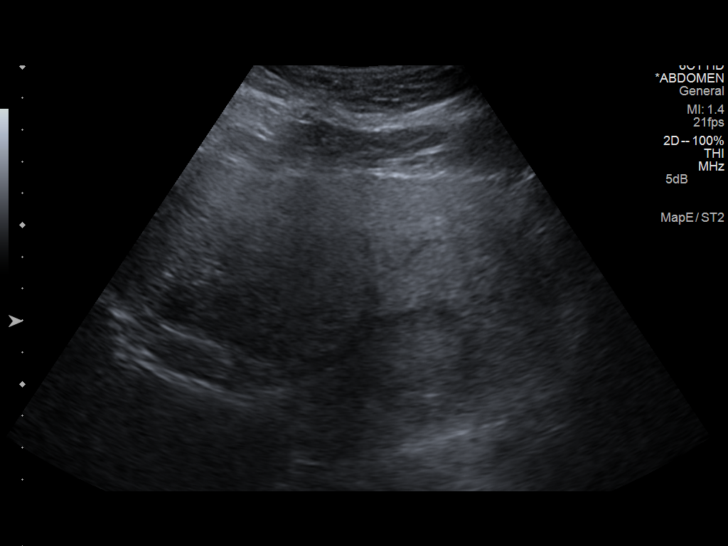
[im 6/10]
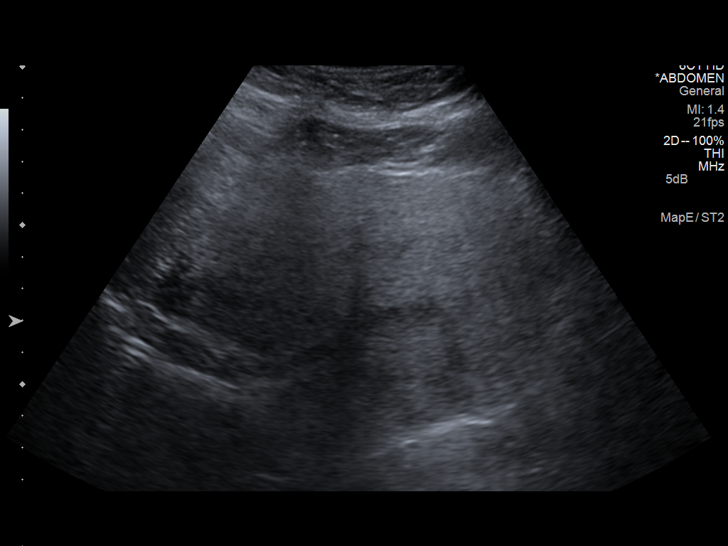
[im 7/10]
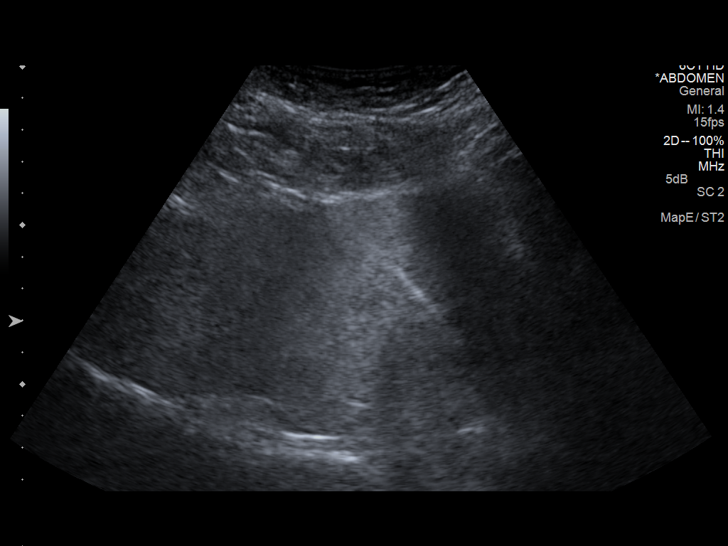
[im 8/10]
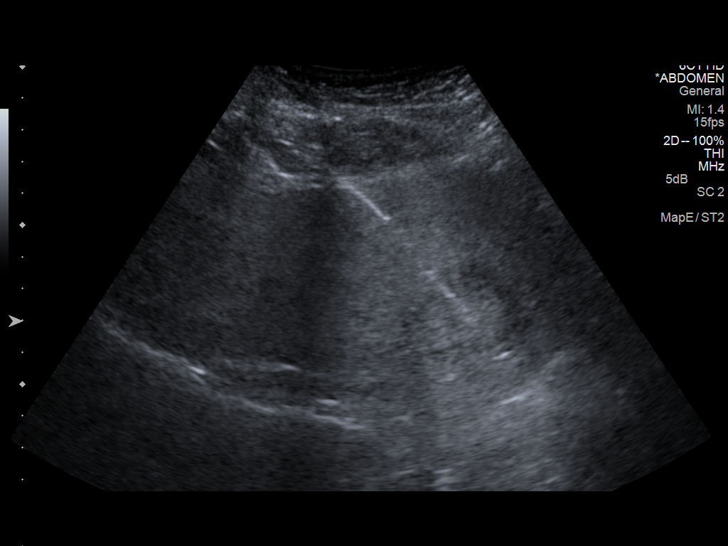
[im 9/10]
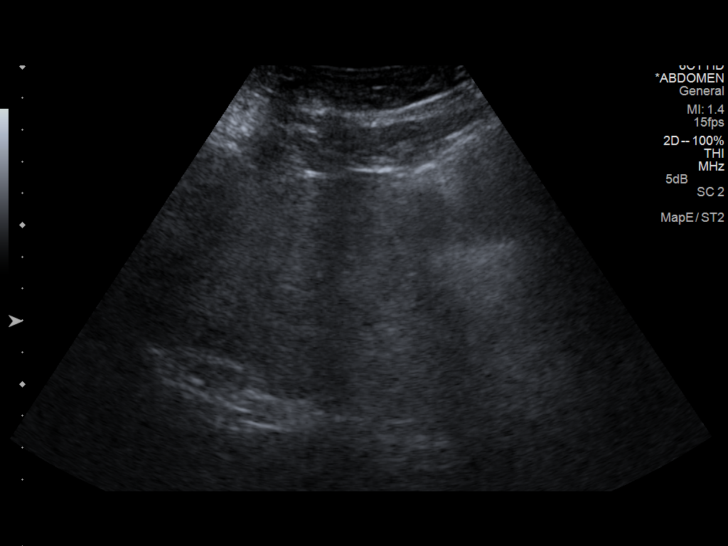
[im 10/10]
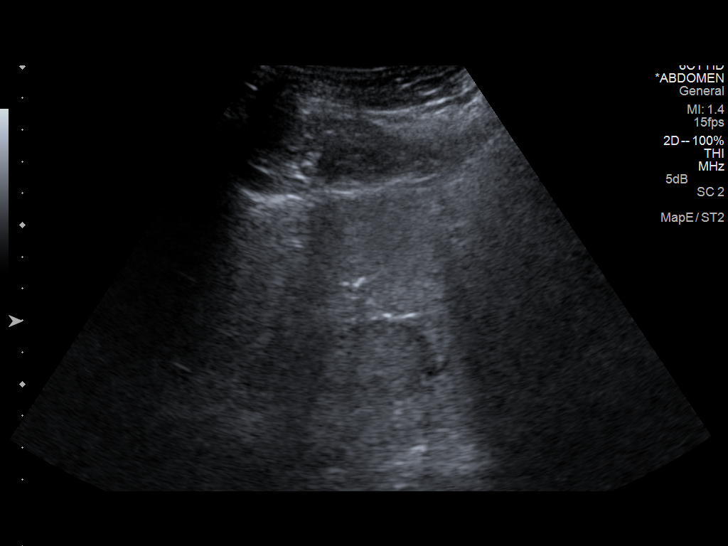

[10 of 10 positions shown; findings below may reference images not displayed]

EXAM:
ULTRASOUND GUIDED CORE BIOPSY OF LIVER

MEDICATIONS:
2.0 mg IV Versed; 100 mcg IV Fentanyl

Total Moderate Sedation Time: 30 min

PROCEDURE:
The procedure, risks, benefits, and alternatives were explained to
the patient. Questions regarding the procedure were encouraged and
answered. The patient understands and consents to the procedure.

The anterior abdominal wall was prepped with Betadine in a sterile
fashion, and a sterile drape was applied covering the operative
field. A sterile gown and sterile gloves were used for the
procedure. Local anesthesia was provided with 1% Lidocaine.

Ultrasound was performed to localize a lesion in the left lobe of
the liver. Under ultrasound guidance, a 17 gauge needle was advanced
to the level of the lesion. A total of 3 coaxial 18 gauge core
biopsy samples were obtained and submitted in formalin. The outer
needle was retracted and additional ultrasound performed.

COMPLICATIONS:
None.
FINDINGS: The largest recurrent lesion measuring 4 cm in the lateral segment
of the left lobe was targeted. Solid tissue was obtained. There were
no immediate complications.
IMPRESSION: Ultrasound-guided core biopsy performed of a 4 cm metastatic lesion
in the left lobe of the liver.

## 2013-11-23 MED ORDER — MIDAZOLAM HCL 2 MG/2ML IJ SOLN
INTRAMUSCULAR | Status: AC
  Filled 2013-11-23: qty 4

## 2013-11-23 MED ORDER — FENTANYL CITRATE 0.05 MG/ML IJ SOLN
INTRAMUSCULAR | Status: AC
  Filled 2013-11-23: qty 4

## 2013-11-23 MED ORDER — FENTANYL CITRATE 0.05 MG/ML IJ SOLN
INTRAMUSCULAR | Status: AC | PRN
Start: 2013-11-23 — End: 2013-11-23
  Administered 2013-11-23: 100 ug via INTRAVENOUS

## 2013-11-23 MED ORDER — SODIUM CHLORIDE 0.9 % IV SOLN
Freq: Once | INTRAVENOUS | Status: AC
  Administered 2013-11-23: 09:00:00 via INTRAVENOUS

## 2013-11-23 MED ORDER — NALOXONE HCL 0.4 MG/ML IJ SOLN
INTRAMUSCULAR | Status: AC
  Filled 2013-11-23: qty 1

## 2013-11-23 MED ORDER — OCTREOTIDE ACETATE 30 MG IM KIT
30.0000 mg | PACK | Freq: Once | INTRAMUSCULAR | Status: AC
  Administered 2013-11-23: 30 mg via INTRAMUSCULAR
  Filled 2013-11-23: qty 1

## 2013-11-23 MED ORDER — MIDAZOLAM HCL 2 MG/2ML IJ SOLN
INTRAMUSCULAR | Status: AC | PRN
  Administered 2013-11-23: 2 mg via INTRAVENOUS

## 2013-11-23 MED ORDER — FLUMAZENIL 0.5 MG/5ML IV SOLN
INTRAVENOUS | Status: AC
  Filled 2013-11-23: qty 5

## 2013-11-23 NOTE — H&P (Signed)
Agree.  Patient well known to me.  For biopsy of liver lesion to reestablish histologic characteristics of metastatic disease.

## 2013-11-23 NOTE — Procedures (Signed)
Procedure: Ultrasound guided core biopsy of liver Findings:  Left lobe liver lesion sampled with 18 G core biopsy x 3 via 17 G needle.

## 2013-11-23 NOTE — H&P (Signed)
Chief Complaint: "I'm here for a liver biopsy" Referring Physician:Ennever HPI: Brian Mays is an 68 y.o. male with hx of metastatic neuroendocrine carcinoma, previously treated with Y-90. He has seen Dr. Kathlene Cote for consideration of repeat treatment, but is scheduled today for repeat liver lesion biopsy to re-evaluate the histology. He has done well with his past procedures. PMHx, meds reviewed.   Past Medical History:  Past Medical History  Diagnosis Date  . GERD (gastroesophageal reflux disease)   . Pancreatitis 1998    chronic  . HTN (hypertension)   . Hemorrhoid   . Hypothyroidism   . Colon polyps   . Metastatic carcinoma 2010    Dr Jonette Eva  . Diverticulosis   . Gallstones   . Hiatal hernia     Past Surgical History:  Past Surgical History  Procedure Laterality Date  . Cholecystectomy  1999  . Carcinoil resection  01/2009  . Appendectomy  1964    Family History:  Family History  Problem Relation Age of Onset  . Kidney disease Mother   . Hyperlipidemia Mother   . Hypertension Mother   . COPD Father   . Ulcerative colitis Daughter     Social History:  reports that he quit smoking about 16 years ago. His smoking use included Cigarettes. He smoked 0.00 packs per day. He has never used smokeless tobacco. He reports that he drinks alcohol. He reports that he does not use illicit drugs.  Allergies:  Allergies  Allergen Reactions  . Iohexol      Code: HIVES, Desc: PER [name] @ [facility], PT IS ALLERGIC TO CONTRAST DYE 10/02/08/RM  05/01/10...needs full premeds per our protocol w/ [facility]., Onset Date: [id number]     Medications:   Medication List    ASK your doctor about these medications       amLODipine 5 MG tablet  Commonly known as:  NORVASC  Take 5 mg by mouth every morning.     aspirin 81 MG tablet  Take 81 mg by mouth daily.     Cholecalciferol 1000 UNITS tablet  Take 1,000 Units by mouth daily.     lansoprazole 30 MG capsule  Commonly known  as:  PREVACID  Take 30 mg by mouth daily at 12 noon.     levothyroxine 50 MCG tablet  Commonly known as:  SYNTHROID, LEVOTHROID  Take 50 mcg by mouth daily before breakfast.     lipase/protease/amylase 12000 UNITS Cpep capsule  Commonly known as:  CREON-12/PANCREASE  Take 1 capsule by mouth 3 (three) times daily before meals.     losartan 100 MG tablet  Commonly known as:  COZAAR  Take 100 mg by mouth every morning.     PARoxetine 10 MG tablet  Commonly known as:  PAXIL  Take 10 mg by mouth every morning.     SANDOSTATIN IJ  Inject as directed every 28 (twenty-eight) days.     vitamin B-12 500 MCG tablet  Commonly known as:  CYANOCOBALAMIN  Take 500 mcg by mouth 2 (two) times daily.        Please HPI for pertinent positives, otherwise complete 10 system ROS negative.  Physical Exam: BP 129/81  Pulse 68  Temp(Src) 98.6 F (37 C) (Oral)  Resp 18  Ht 5\' 11"  (1.803 m)  Wt 208 lb (94.348 kg)  BMI 29.02 kg/m2 Body mass index is 29.02 kg/(m^2).   General Appearance:  Alert, cooperative, no distress, appears stated age  Head:  Normocephalic, without obvious abnormality,  atraumatic  ENT: Unremarkable  Neck: Supple, symmetrical, trachea midline  Lungs:   Clear to auscultation bilaterally, no w/r/r, respirations unlabored without use of accessory muscles.  Chest Wall:  No tenderness or deformity  Heart:  Regular rate and rhythm, S1, S2 normal, no murmur, rub or gallop.  Abdomen:   Soft, non-tender, non distended.  Extremities: Extremities normal, atraumatic, no cyanosis or edema  Pulses: 2+ and symmetric  Neurologic: Normal affect, no gross deficits.   CBC    Component Value Date/Time   WBC 8.9 11/14/2013 1218   HGB 13.4 11/14/2013 1218   HCT 41.4 11/14/2013 1218   PLT 206 11/14/2013 1218   Prothrombin Time/INR 13.0/0.99   Assessment/Plan Metastatic neuroendocrine carcinoma For US guided liver lesion biopsy today. Explained procedure, risks, complications, use of  sedation. Labs from 1/14 reviewed. Consent signed in chart  Ascencion Dike PA-C 11/23/2013, 9:39 AM

## 2013-11-23 NOTE — Discharge Instructions (Signed)
Moderate Sedation, Adult °Moderate sedation is given to help you relax or even sleep through a procedure. You may remain sleepy, be clumsy, or have poor balance for several hours following this procedure. Arrange for a responsible adult, family member, or friend to take you home. A responsible adult should stay with you for at least 24 hours or until the medicines have worn off. °· Do not participate in any activities where you could become injured for the next 24 hours, or until you feel normal again. Do not: °· Drive. °· Swim. °· Ride a bicycle. °· Operate heavy machinery. °· Cook. °· Use power tools. °· Climb ladders. °· Work at heights. °· Do not make important decisions or sign legal documents until you are improved. °· Vomiting may occur if you eat too soon. When you can drink without vomiting, try water, juice, or soup. Try solid foods if you feel little or no nausea. °· Only take over-the-counter or prescription medications for pain, discomfort, or fever as directed by your caregiver.If pain medications have been prescribed for you, ask your caregiver how soon it is safe to take them. °· Make sure you and your family fully understands everything about the medication given to you. Make sure you understand what side effects may occur. °· You should not drink alcohol, take sleeping pills, or medications that cause drowsiness for at least 24 hours. °· If you smoke, do not smoke alone. °· If you are feeling better, you may resume normal activities 24 hours after receiving sedation. °· Keep all appointments as scheduled. Follow all instructions. °· Ask questions if you do not understand. °SEEK MEDICAL CARE IF:  °· Your skin is pale or bluish in color. °· You continue to feel sick to your stomach (nauseous) or throw up (vomit). °· Your pain is getting worse and not helped by medication. °· You have bleeding or swelling. °· You are still sleepy or feeling clumsy after 24 hours. °SEEK IMMEDIATE MEDICAL CARE IF:   °· You develop a rash. °· You have difficulty breathing. °· You develop any type of allergic problem. °· You have a fever. °Document Released: 07/13/2001 Document Revised: 01/10/2012 Document Reviewed: 06/25/2013 °ExitCare® Patient Information ©2014 [employer]. °Liver Biopsy °Care After °Refer to this sheet in the next few weeks. These discharge instructions provide you with general information on caring for yourself after you leave the hospital. Your caregiver may also give you specific instructions. Your treatment has been planned according to the most current medical practices available, but unavoidable complications sometimes occur. If you have any problems or questions after discharge, please call your caregiver. °HOME CARE INSTRUCTIONS  °· You should rest for 1 to 2 days or as instructed. °· If you go home the same day as your procedure (outpatient), have a responsible adult take you home and stay with you overnight. °· Do not lift more than 5 pounds or play contact sports for 2 weeks. °· Do not drive for 24 hours after this test. °· Do not take medicine containing aspirin or drink alcohol for 1 week after this test. °· Change bandages (dressings) as directed. °· Only take over-the-counter or prescription medicines for pain, discomfort, or fever as directed by your caregiver. °OBTAINING YOUR TEST RESULTS °Not all test results are available during your visit. If your test results are not back during the visit, make an appointment with your caregiver to find out the results. Do not assume everything is normal if you have not heard from your   caregiver or the medical facility. It is important for you to follow up on all of your test results. °SEEK MEDICAL CARE IF:  °· You have increased bleeding (more than a small spot) from the biopsy site. °· You have redness, swelling, or increasing pain in the biopsy site. °· You have an oral temperature above 102° F (38.9° C). °SEEK IMMEDIATE MEDICAL CARE IF:  °· You  develop swelling or pain in the belly (abdomen). °· You develop a rash. °· You have difficulty breathing, feel short of breath, or feel faint. °· You develop any reaction or side effects to medicines given. °MAKE SURE YOU:  °· Understand these instructions. °· Will watch your condition. °· Will get help right away if you are not doing well or get worse. °Document Released: 05/07/2005 Document Revised: 01/10/2012 Document Reviewed: 05/30/2008 °ExitCare® Patient Information ©2014 [employer]. ° °

## 2013-11-26 ENCOUNTER — Other Ambulatory Visit: Payer: Self-pay | Admitting: Interventional Radiology

## 2013-11-26 DIAGNOSIS — C7B8 Other secondary neuroendocrine tumors: Principal | ICD-10-CM

## 2013-11-26 DIAGNOSIS — C7A8 Other malignant neuroendocrine tumors: Secondary | ICD-10-CM

## 2013-12-04 ENCOUNTER — Telehealth: Payer: Self-pay | Admitting: Emergency Medicine

## 2013-12-04 NOTE — Telephone Encounter (Signed)
CALLED PT TO MAKE HIM AWARE THAT [employer] HAS APPROVED HIS Y-90 TREATMENT.  NOTIFIED TIFFANY AT [facility] -IR ALSO.

## 2013-12-07 ENCOUNTER — Other Ambulatory Visit: Payer: Self-pay | Admitting: Radiology

## 2013-12-07 ENCOUNTER — Encounter (HOSPITAL_COMMUNITY): Payer: Self-pay | Admitting: Pharmacy Technician

## 2013-12-09 ENCOUNTER — Other Ambulatory Visit: Payer: Self-pay | Admitting: Internal Medicine

## 2013-12-12 ENCOUNTER — Encounter (HOSPITAL_COMMUNITY)
Admission: RE | Admit: 2013-12-12 | Discharge: 2013-12-12 | Disposition: A | Discharge status: Home / Self Care | Payer: Medicare PPO | Source: Ambulatory Visit | Attending: Interventional Radiology | Admitting: Interventional Radiology

## 2013-12-12 ENCOUNTER — Encounter (HOSPITAL_COMMUNITY): Payer: Self-pay

## 2013-12-12 ENCOUNTER — Ambulatory Visit (HOSPITAL_COMMUNITY)
Admission: RE | Admit: 2013-12-12 | Discharge: 2013-12-12 | Disposition: A | Discharge status: Home / Self Care | Payer: Medicare PPO | Source: Ambulatory Visit | Attending: Interventional Radiology | Admitting: Interventional Radiology

## 2013-12-12 ENCOUNTER — Other Ambulatory Visit: Payer: Self-pay | Admitting: Interventional Radiology

## 2013-12-12 DIAGNOSIS — C7A8 Other malignant neuroendocrine tumors: Secondary | ICD-10-CM

## 2013-12-12 DIAGNOSIS — C787 Secondary malignant neoplasm of liver and intrahepatic bile duct: Secondary | ICD-10-CM | POA: Insufficient documentation

## 2013-12-12 DIAGNOSIS — Z7982 Long term (current) use of aspirin: Secondary | ICD-10-CM | POA: Insufficient documentation

## 2013-12-12 DIAGNOSIS — E039 Hypothyroidism, unspecified: Secondary | ICD-10-CM | POA: Insufficient documentation

## 2013-12-12 DIAGNOSIS — K219 Gastro-esophageal reflux disease without esophagitis: Secondary | ICD-10-CM | POA: Insufficient documentation

## 2013-12-12 DIAGNOSIS — C7B8 Other secondary neuroendocrine tumors: Principal | ICD-10-CM

## 2013-12-12 DIAGNOSIS — D499 Neoplasm of unspecified behavior of unspecified site: Secondary | ICD-10-CM

## 2013-12-12 DIAGNOSIS — Z79899 Other long term (current) drug therapy: Secondary | ICD-10-CM | POA: Insufficient documentation

## 2013-12-12 DIAGNOSIS — I10 Essential (primary) hypertension: Secondary | ICD-10-CM | POA: Insufficient documentation

## 2013-12-12 DIAGNOSIS — Z87891 Personal history of nicotine dependence: Secondary | ICD-10-CM | POA: Insufficient documentation

## 2013-12-12 DIAGNOSIS — C7A00 Malignant carcinoid tumor of unspecified site: Secondary | ICD-10-CM

## 2013-12-12 LAB — CBC WITH DIFFERENTIAL/PLATELET
Basophils Absolute: 0 10*3/uL (ref 0.0–0.1)
Basophils Relative: 1 % (ref 0–1)
EOS ABS: 0.2 10*3/uL (ref 0.0–0.7)
Eosinophils Relative: 3 % (ref 0–5)
HCT: 40.7 % (ref 39.0–52.0)
Hemoglobin: 13.5 g/dL (ref 13.0–17.0)
Lymphocytes Relative: 23 % (ref 12–46)
Lymphs Abs: 1.5 10*3/uL (ref 0.7–4.0)
MCH: 28.7 pg (ref 26.0–34.0)
MCHC: 33.2 g/dL (ref 30.0–36.0)
MCV: 86.4 fL (ref 78.0–100.0)
MONOS PCT: 7 % (ref 3–12)
Monocytes Absolute: 0.5 10*3/uL (ref 0.1–1.0)
NEUTROS PCT: 66 % (ref 43–77)
Neutro Abs: 4.3 10*3/uL (ref 1.7–7.7)
Platelets: 195 10*3/uL (ref 150–400)
RBC: 4.71 MIL/uL (ref 4.22–5.81)
RDW: 14.6 % (ref 11.5–15.5)
WBC: 6.5 10*3/uL (ref 4.0–10.5)

## 2013-12-12 LAB — COMPREHENSIVE METABOLIC PANEL
ALT: 34 U/L (ref 0–53)
AST: 33 U/L (ref 0–37)
Albumin: 3.6 g/dL (ref 3.5–5.2)
Alkaline Phosphatase: 68 U/L (ref 39–117)
BUN: 14 mg/dL (ref 6–23)
CALCIUM: 9.3 mg/dL (ref 8.4–10.5)
CO2: 29 mEq/L (ref 19–32)
Chloride: 101 mEq/L (ref 96–112)
Creatinine, Ser: 0.79 mg/dL (ref 0.50–1.35)
GFR calc non Af Amer: >90 mL/min (ref >90)
GLUCOSE: 112 mg/dL — AB (ref 70–99)
Potassium: 4.1 mEq/L (ref 3.7–5.3)
Sodium: 140 mEq/L (ref 137–147)
TOTAL PROTEIN: 7.7 g/dL (ref 6.0–8.3)
Total Bilirubin: 0.6 mg/dL (ref 0.3–1.2)

## 2013-12-12 LAB — PROTIME-INR
INR: 0.96 (ref 0.00–1.49)
Prothrombin Time: 12.6 seconds (ref 11.6–15.2)

## 2013-12-12 LAB — APTT: APTT: 27 s (ref 24–37)

## 2013-12-12 IMAGING — NM NM MISC PROCEDURE
4 series · 19 of 25 positions shown · non-contrast
Comparison: IR EMBO TUMOR ORGAN ISCHEMIA INFARCT INC GUIDE
ROADMAPPING dated [DATE];

CLINICAL DATA: Metastatic carcinoid tumor to the liver. Treated [AGE] go mapping and shunt fraction study

EXAM:
NUCLEAR MEDICINE LIVER SCAN
TECHNIQUE: Abdominal images were obtained in multiple projections after
intravenous injection of radiopharmaceutical.

[Series 1040: exported mm reading · 4.7mm · 0.77mm/px · 1 of 4 slices shown]
[im 1/4]
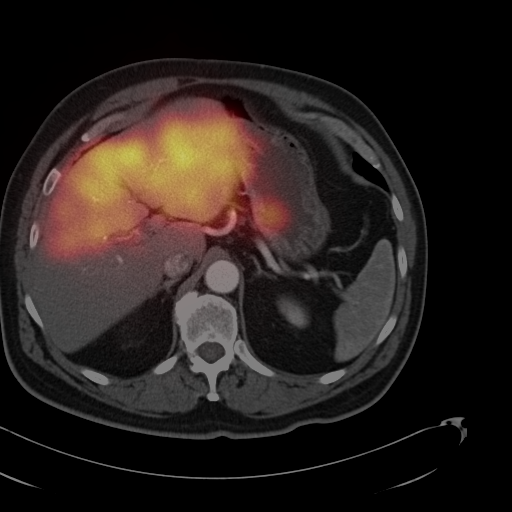

[Series 1057: fused tr · 0.8mm · 0.99mm/px · 6 of 52 slices shown]
[im 1/52]
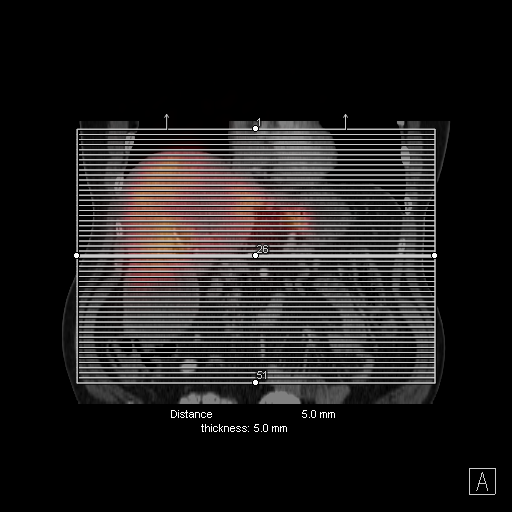
[im 15/52]
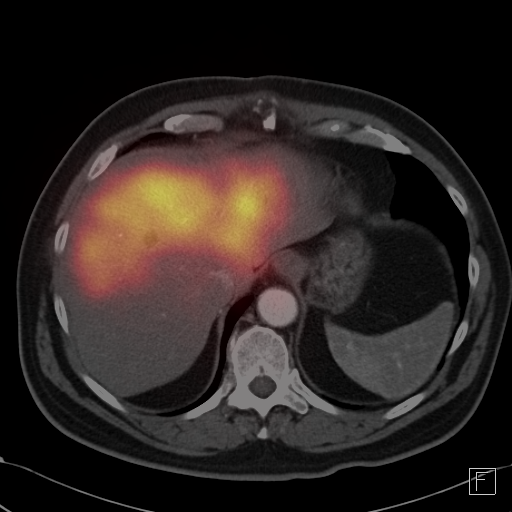
[im 22/52]
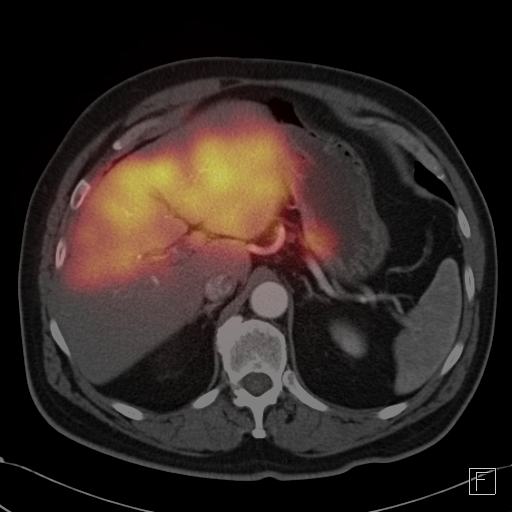
[im 30/52]
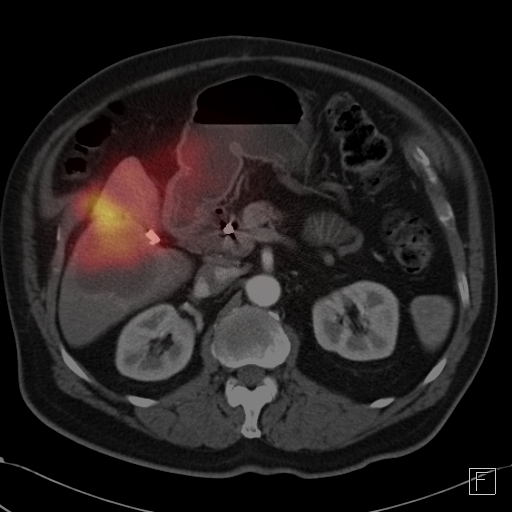
[im 44/52]
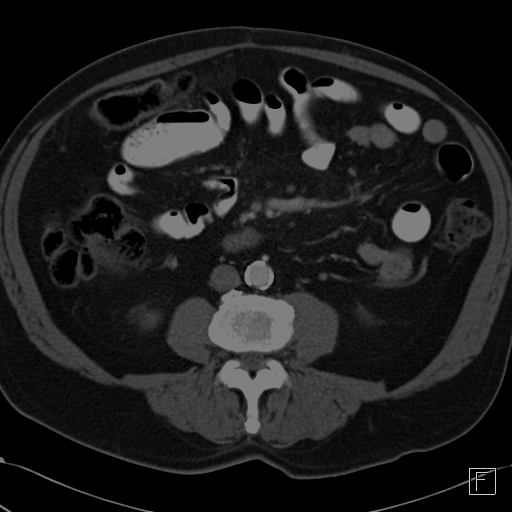
[im 52/52]
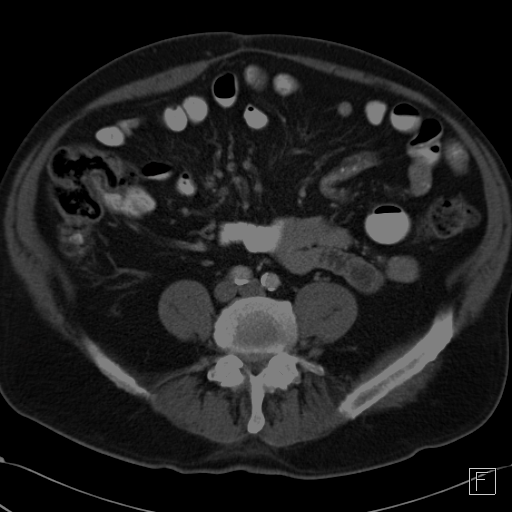

[Series 1060: fused sag · 0.8mm · 0.89mm/px · 6 of 52 slices shown]
[im 1/52]
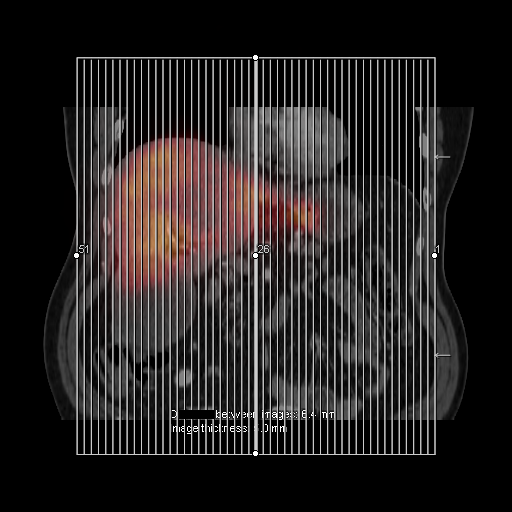
[im 15/52]
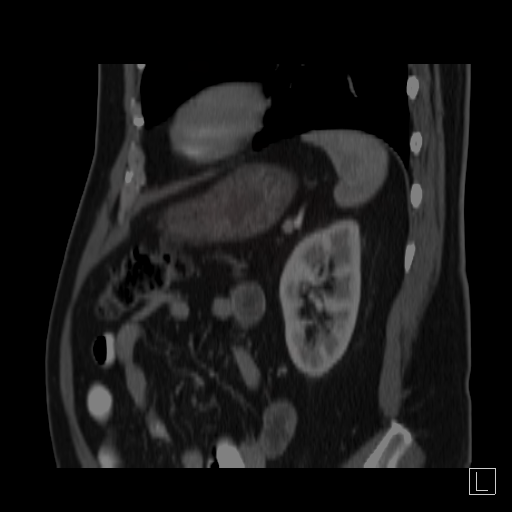
[im 22/52]
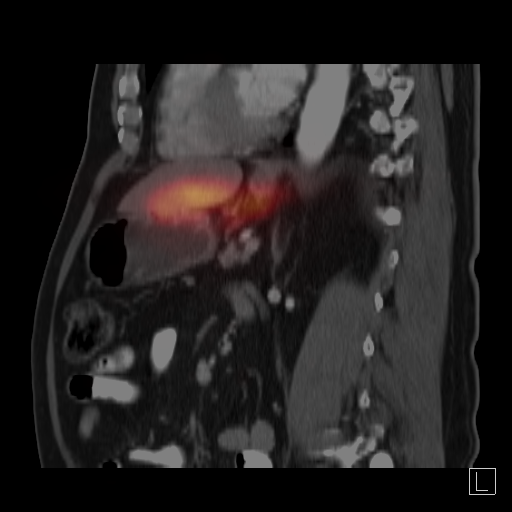
[im 30/52]
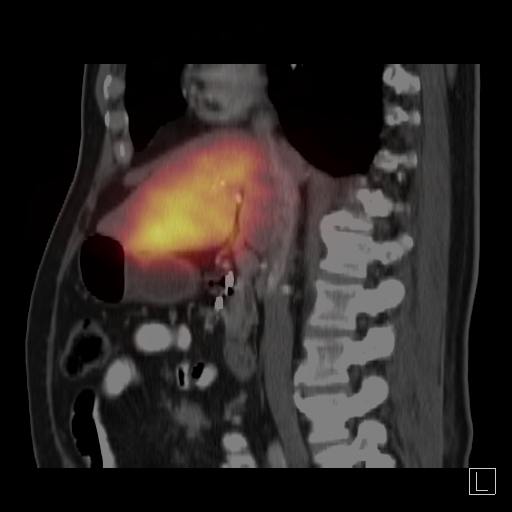
[im 44/52]
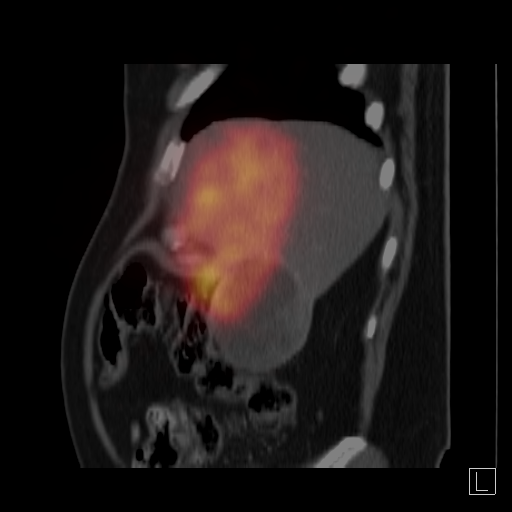
[im 52/52]
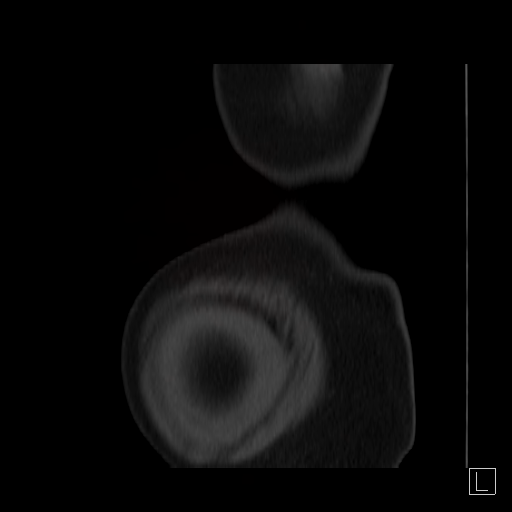

[Series 1063: fused cor · 0.8mm · 0.98mm/px · 6 of 52 slices shown]
[im 1/52]
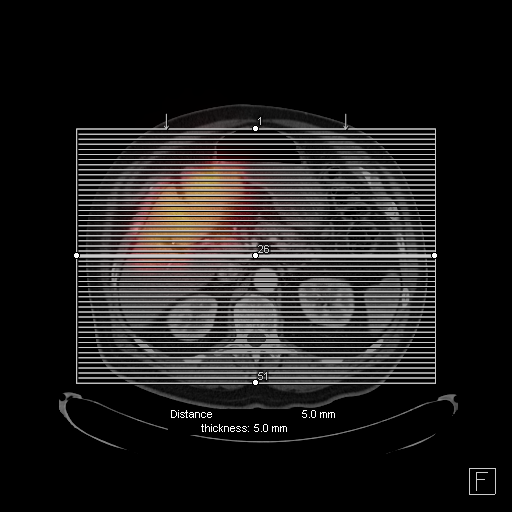
[im 15/52]
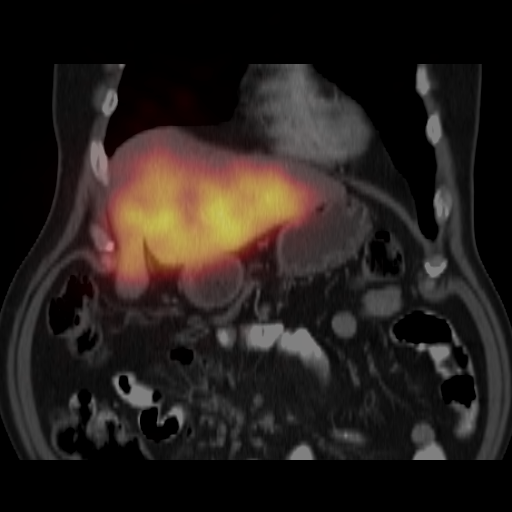
[im 22/52]
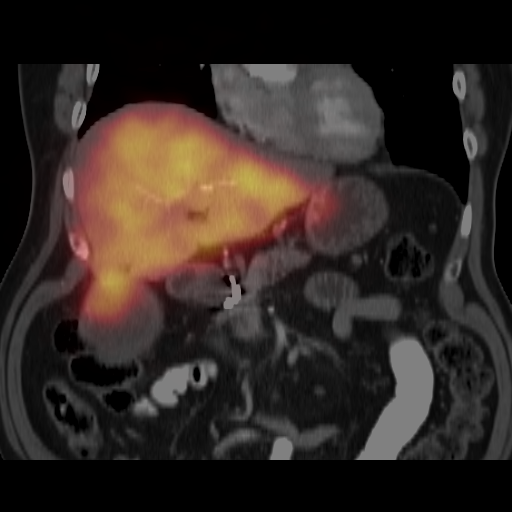
[im 30/52]
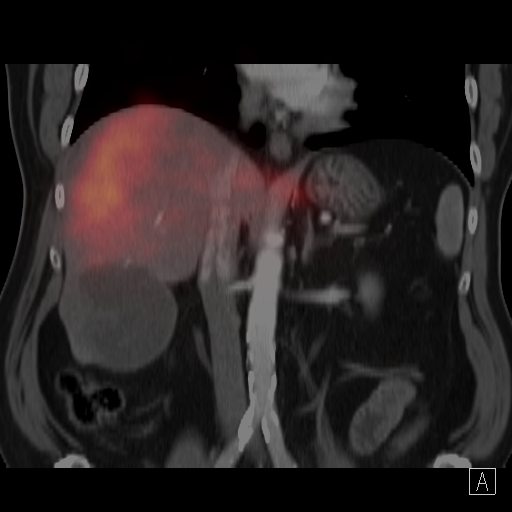
[im 44/52]
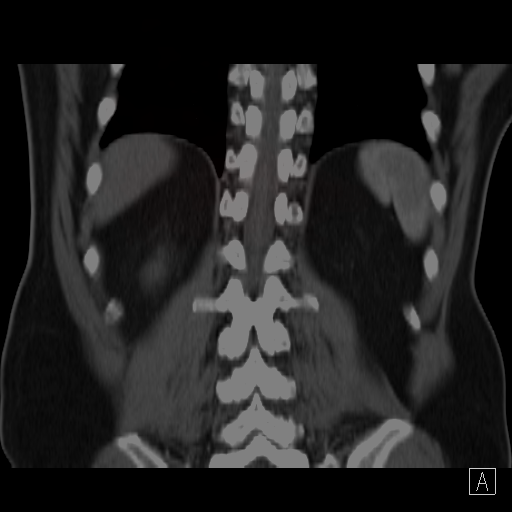
[im 52/52]
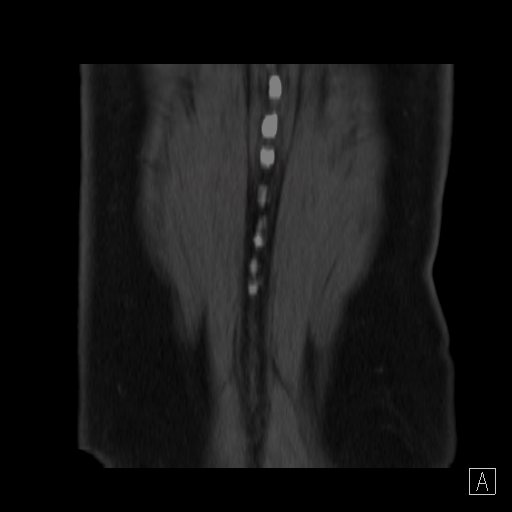

[19 of 25 positions shown; findings below may reference images not displayed]

CT ABD/PELVIS W CM dated [DATE]; NM
PET IMAGE RESTAG (PS) SKULL BASE TO THIGH dated [DATE]

RADIOPHARMACEUTICALS:  [IS] KYSELINA TECHNETIUM TO 99M ALBUMIN
AGGREGATED
FINDINGS: Injection of MAA was performed in the left hepatic artery. The MAA
localizes within the medial and lateral segments of left hepatic
lobe. There is a focus of mild activity along the lesser curvature
of the stomach proximally.

Lung shunt fraction equals 1%.
IMPRESSION: 1. Lung shunt fraction equals 1%
2. Small amount of extra hepatic activity along the lesser curvature
of stomach.
3. Ejection localizes within the left hepatic lobe.
Findings conveyed KYSELINA on [DATE]  at[DATE].

## 2013-12-12 IMAGING — US IR EMBO TUMOR ORGAN ISCHEMIA INFARCT INC GUIDE ROADMAPPING
11 of 14 series · 11 of 24 positions shown · non-contrast
Comparison: CT on [DATE] and prior arteriography on [DATE]
and [DATE].

INDICATION: Metastatic neuroendocrine carcinoma to the liver and history of
prior Yttrium 90 radioembolization of the right lobe of the liver on
[DATE]. There is recent evidence of progression of metastatic
disease in the liver and the patient presents for arteriographic
assessment with possible embolization and pulmonary shunt fraction
calculation prior to additional radioembolization.
TECHNIQUE: Informed written consent was obtained from the patient after a
discussion of the risks, benefits and alternatives to treatment.
Questions regarding the procedure were encouraged and answered.

[Series 2: body 4 · 1 of 57 frames shown (1 of 3)]
[frame 29/57]
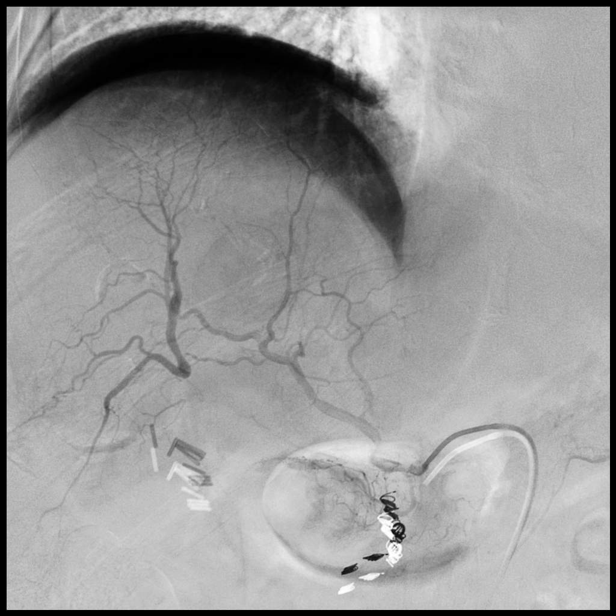

[Series 3: body 4 · 1 of 27 frames shown (2 of 3)]
[frame 23/27]
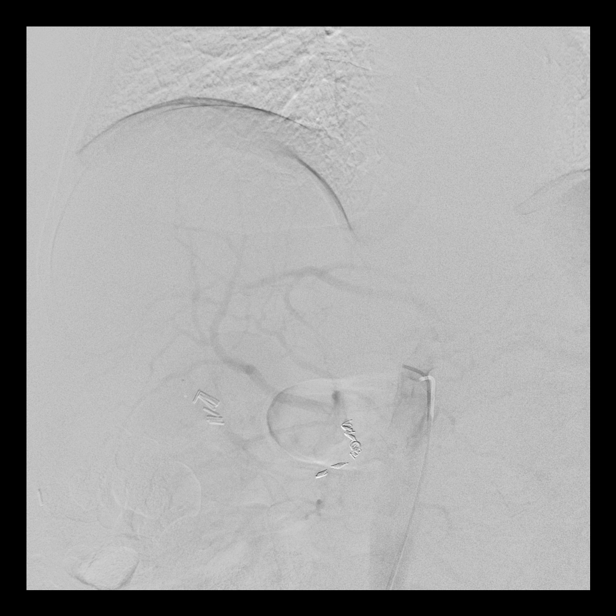

[Series 5: body 4 · 1 of 45 frames shown (3 of 3)]
[frame 7/45]
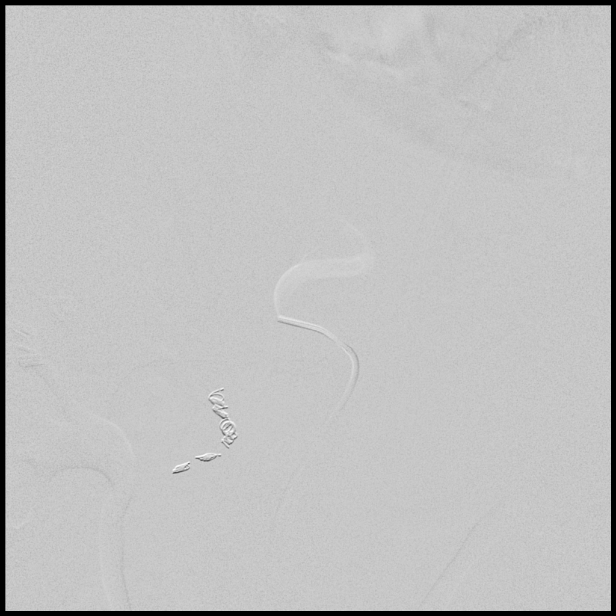

[Series 6: body 3 · 1 of 22 frames shown (1 of 6)]
[frame 4/22]
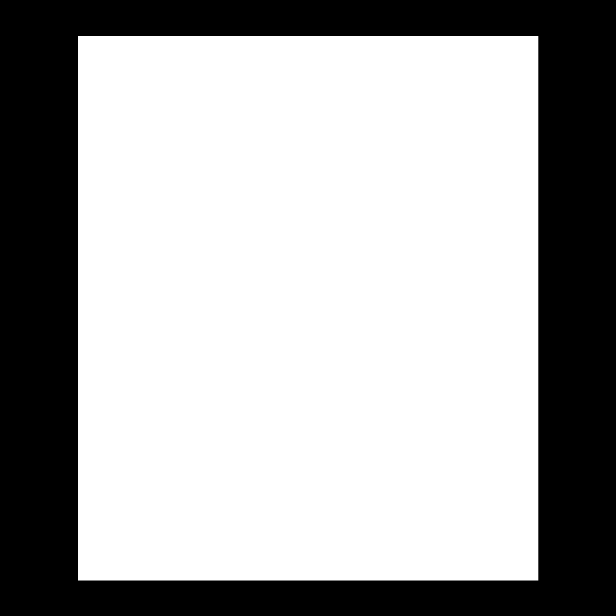

[Series 8: body 3 · 1 of 21 frames shown (2 of 6)]
[frame 11/21]
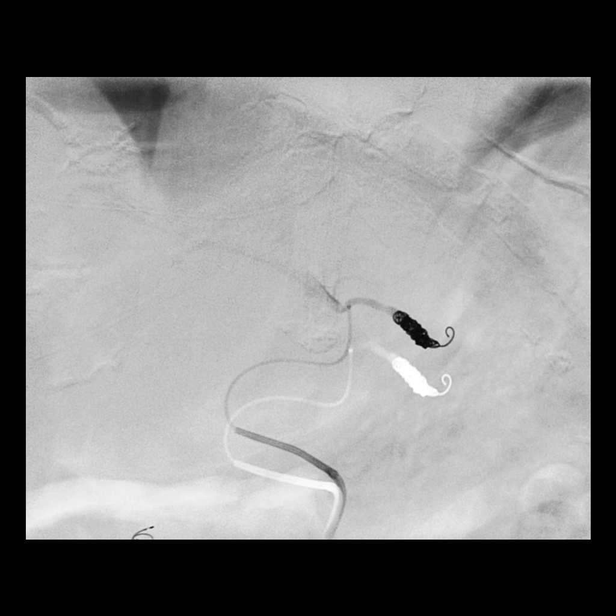

[Series 10: body 3 · 1 of 22 frames shown (3 of 6)]
[frame 11/22]
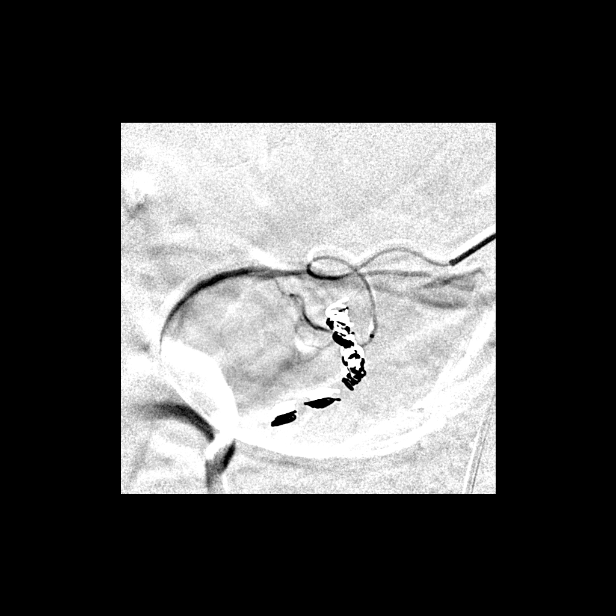

[Series 11: body 3 · 1 of 27 frames shown (4 of 6)]
[frame 7/27]
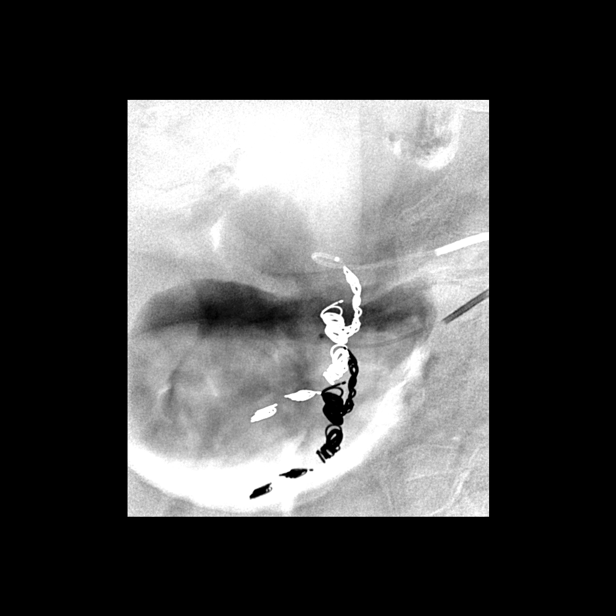

[Series 12: body 3 · 1 of 32 frames shown (5 of 6)]
[frame 27/32]
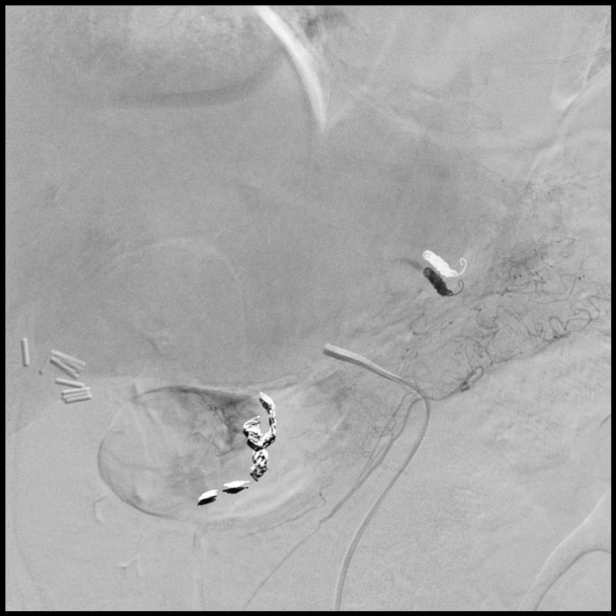

[Series 13: body 3 · 1 of 14 frames shown (6 of 6)]
[frame 12/14]
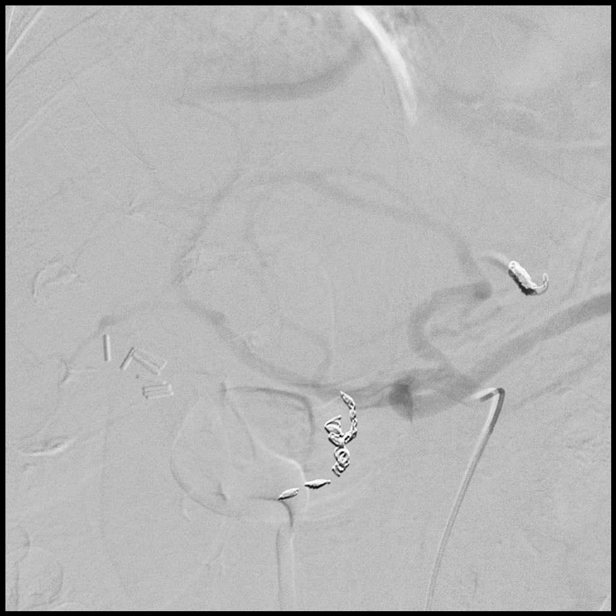

[Series 100: dsa body · 1 of 7 slices shown (1 of 2)]
[im 5/7]
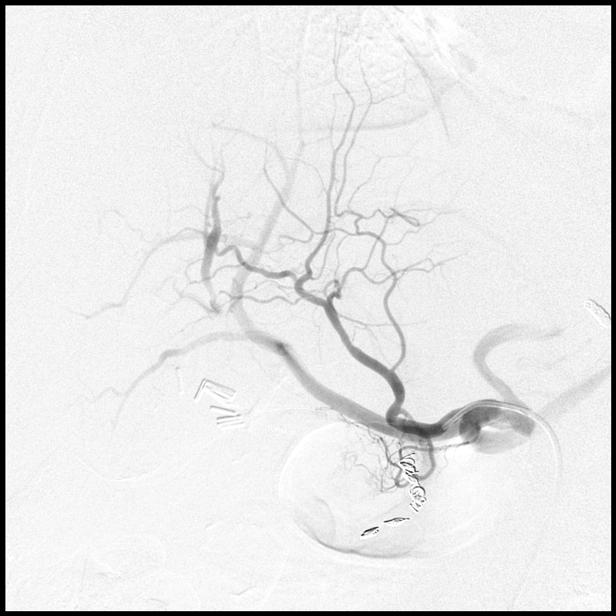

[Series 300: dsa body · 1 of 6 slices shown (2 of 2)]
[im 4/6]
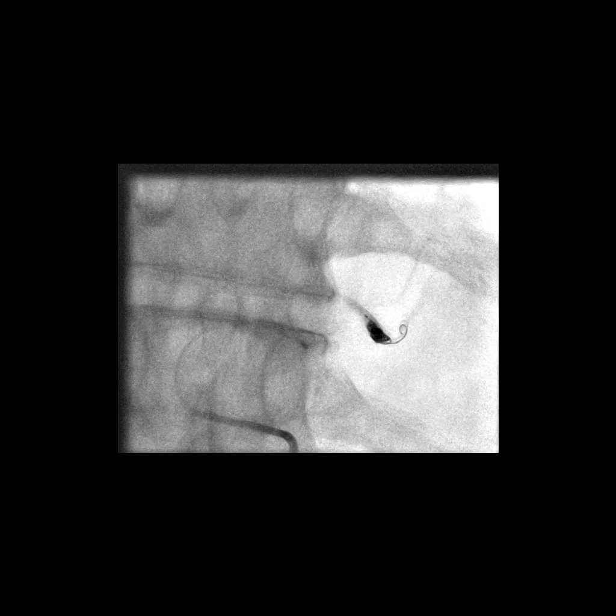

[11 of 24 positions shown; findings below may reference images not displayed]

EXAM:
1. ULTRAOUND GUIDANCE FOR VASCULAR ACCESS OF THE RIGHT COMMON
FEMORAL ARTERY
2. SELECTIVE CELIAC ARTERIOGRAPHY (1st ORDER)
3. ADDITIONAL SELECTIVE HEPATIC ARTERIOGRAPHY WITH CATHETERIZATION
OF 2 SEPARATE SECOND ORDER TRUNKS
4. ADDITIONAL SELECTIVE ARTERIOGRAPHY OF ACCESSORY RIGHT GASTRIC
ARTERY (3rd ORDER)
5. TRANSCATHETER EMBOLIZATION OF ACCESSORY RIGHT GASTRIC ARTERY
6. ADDITIONAL SELECTIVE ARTERIOGRAPHY OF ACCESSORY DUODENAL ARTERY
(3rd ORDER)
7. TRANSCATHETER EMBOLIZATION OF ACCESSORY DUODENAL ARTERY
8. ADMINSTRATATION OF [6L] MAA

MEDICATIONS:
Prior to the procedure, the patient received 125 mg IV Solu-Medrol
and 50 mg IV Benadryl for contrast allergy.
The right groin was prepped and draped in the usual sterile fashion,
and a sterile drape was applied covering the operative field.
Maximum barrier sterile technique with sterile gowns and gloves were
used for the procedure. A timeout was performed prior to the
initiation of the procedure. Local anesthesia was provided with 1%
lidocaine.

Ultrasound was used to confirm patency of the right common femoral
artery. Under ultrasound guidance, the right common femoral artery
was accessed with a micropuncture kit after the overlying soft
tissues were anesthetized with 1% lidocaine. An ultrasound image was
saved for documentation purposes. The micropuncture sheath was
exchanged for a 5 French vascular sheath over a Bentson wire.

The celiac axis was catheterized with a cobra catheter. Celiac
arteriography was performed. The catheter was further advanced into
the left hepatic artery and additional arteriography performed. A
micro catheter was advanced into a left hepatic arterial gastric
branch. Selective arteriography was performed. Transcatheter
embolization was then performed utilizing micro coils ranging in
diameter from 2 mm to 4 mm in maximum diameter. Additional
angiography was performed through the micro catheter.

The 5 French catheter was then used to selectively catheterize the
right hepatic artery. Selective arteriography was performed. A micro
catheter was then advanced into a third order duodenum branch.
Selective arteriography was performed. Transcatheter embolization of
the duodenum branch was then performed utilizing micro coils ranging
in diameter from 2 mm to 4 mm. Additional angiography was performed
through the micro catheter.

The 5 French catheter and microcatheter was advanced into the left
hepatic artery and 5 mCi of technetium 99 MAA was administered for
this location.

A closure arteriogram was performed through the side arm of the
sheath confirming access within the right common femoral artery. The
sheath was removed. Hemostasis was obtained with use of a closure
device and manual compression. A dressing was placed. The patient
tolerated procedure well without immediate postprocedural
complication. The patient was escorted to nuclear medicine
department for planar imaging.

ANESTHESIA/SEDATION:
Sedation:  Fentanyl 200 mcg IV; Versed 6.0 mg IV

Total Moderate Sedation Time

150 minutes

CONTRAST:  80mL OMNIPAQUE IOHEXOL 300 MG/ML  SOLN
RADIOPHARMACEUTICALS:  5mCi [6L] MAA administered via the left
hepatic artery

FLUOROSCOPY TIME:  41 minutes.  18 seconds.

ACCESS:
Right common femoral artery; hemostasis achieved with Cordis
ExoSeal.

COMPLICATIONS:
None immediate.
FINDINGS: Complex hepatic arterial anatomy is again noted. There is a relative
trifurcation of the celiac axis into a large left hepatic artery, a
right hepatic artery and a splenic artery. The left hepatic artery
supplies a dominant proximal gastric trunk that supplies the
majority of the stomach. Additional accessory trunk supplying the
proximal stomach also emanates further distally from the left
hepatic artery. It was not felt safe to embolize the dominant trunk
supplying the stomach for fear of inducing gastric ischemia. The
accessory trunk supplying the proximal stomach was successfully
embolized and occluded with coils.

The rest of the distal left hepatic arterial supply supplies the
left lobe of the liver with visible tumor blush noted at the level
of a dominant metastatic lesion.

Right hepatic arteriography demonstrates early bifurcation of the
right hepatic artery into relative medial and lateral branches. Off
of the medial branch, a prominent duodenal accessory artery is
identified. The gastroduodenal artery remains completely occluded
after prior coil embolization.

In the event that radioembolization was necessary through the medial
right hepatic artery, it was felt that the duodenal branch should be
embolized to avoid non target embolization to the duodenum. This
branch was successfully embolized with a micro catheter and micro
coils with occlusion noted on completion arteriography.
IMPRESSION: 1. Successful pre Y-90 arteriogram with percutaneous coil
embolization of separate accessory gastric artery off of the left
hepatic artery and accessory duodenal artery off of the right
hepatic artery.
2. Successful administration of 5 mCi of technetium 99m MAA via the
left hepatic artery. Awaiting results of nuclear medicine liver
scan.

PLAN:
Pending the results of the nuclear medicine liver scan, the patient
will return on [DATE]. Pending the patient's recovery from this
initial treatment session, the patient will return for
radioembolization of the left lobe of the liver.

## 2013-12-12 MED ORDER — HYDROCODONE-ACETAMINOPHEN 5-325 MG PO TABS: 1.0000 | ORAL_TABLET | ORAL | Status: DC | PRN

## 2013-12-12 MED ORDER — LIDOCAINE HCL 1 % IJ SOLN
INTRAMUSCULAR | Status: AC
  Filled 2013-12-12: qty 20

## 2013-12-12 MED ORDER — DIPHENHYDRAMINE HCL 50 MG/ML IJ SOLN
50.0000 mg | Freq: Once | INTRAMUSCULAR | Status: AC
  Administered 2013-12-12: 50 mg via INTRAVENOUS
  Filled 2013-12-12: qty 1

## 2013-12-12 MED ORDER — METHYLPREDNISOLONE SODIUM SUCC 125 MG IJ SOLR
125.0000 mg | Freq: Once | INTRAMUSCULAR | Status: AC
  Administered 2013-12-12: 125 mg via INTRAVENOUS
  Filled 2013-12-12: qty 2

## 2013-12-12 MED ORDER — IOHEXOL 300 MG/ML  SOLN
80.0000 mL | Freq: Once | INTRAMUSCULAR | Status: AC | PRN
  Administered 2013-12-12: 80 mL via INTRA_ARTERIAL

## 2013-12-12 MED ORDER — MIDAZOLAM HCL 2 MG/2ML IJ SOLN
INTRAMUSCULAR | Status: AC
  Filled 2013-12-12: qty 6

## 2013-12-12 MED ORDER — SODIUM CHLORIDE 0.9 % IV SOLN: INTRAVENOUS | Status: DC

## 2013-12-12 MED ORDER — FENTANYL CITRATE 0.05 MG/ML IJ SOLN
INTRAMUSCULAR | Status: AC
  Filled 2013-12-12: qty 6

## 2013-12-12 MED ORDER — SODIUM CHLORIDE 0.9 % IV SOLN
INTRAVENOUS | Status: DC
  Administered 2013-12-12: 08:00:00 via INTRAVENOUS

## 2013-12-12 MED ORDER — FENTANYL CITRATE 0.05 MG/ML IJ SOLN
INTRAMUSCULAR | Status: AC | PRN
  Administered 2013-12-12: 25 ug via INTRAVENOUS
  Administered 2013-12-12: 50 ug via INTRAVENOUS
  Administered 2013-12-12 (×3): 25 ug via INTRAVENOUS
  Administered 2013-12-12: 50 ug via INTRAVENOUS

## 2013-12-12 MED ORDER — HYDROCORTISONE NA SUCCINATE PF 250 MG IJ SOLR: 250.0000 mg | Freq: Once | INTRAMUSCULAR | Status: DC

## 2013-12-12 MED ORDER — TECHNETIUM TO 99M ALBUMIN AGGREGATED
4.3000 | Freq: Once | INTRAVENOUS | Status: AC | PRN
  Administered 2013-12-12: 4 via INTRAVENOUS

## 2013-12-12 MED ORDER — NITROGLYCERIN 1 MG/10 ML FOR IR/CATH LAB
100.0000 ug | INTRA_ARTERIAL | Status: AC
  Administered 2013-12-12: 100 ug via INTRA_ARTERIAL

## 2013-12-12 MED ORDER — MIDAZOLAM HCL 2 MG/2ML IJ SOLN
INTRAMUSCULAR | Status: AC | PRN
  Administered 2013-12-12: 1 mg via INTRAVENOUS
  Administered 2013-12-12 (×10): 0.5 mg via INTRAVENOUS

## 2013-12-12 NOTE — Procedures (Signed)
Procedure:  Selective arteriography of celiac axis, right hepatic artery, left hepatic artery, right gastric artery off of left hepatic and duodenal artery off right hepatic. Findings:  Liver supplied by a trifurcation between left and two separate right hepatic arteries.  Right gastric arterial supply embolized with microcoils.  Duodenal branch also embolized with microcoils. MAA injected into left hepatic artery. NM imaging to follow.

## 2013-12-12 NOTE — Discharge Instructions (Signed)
Post Y-90 Radioembolization Discharge Instructions  You have been given a radioactive material during your procedure.  While it is safe for you to be discharged home from the hospital, you need to proceed directly home.    Do not use public transportation, including air travel, lasting more than 2 hours for 1 week.  Avoid crowded public places for 1 week.  Adult visitors should try to avoid close contact with you for 1 week.    Children and pregnant females should not visit or have close contact with you for 1 week.  Items that you touch are not radioactive.  Do not sleep in the same bed as your partner for 1 week, and a condom should be used for sexual activity during the first 24 hours.  Your blood may be radioactive and caution should be used if any bleeding occurs during the recovery period.  Body fluids may be radioactive for 24 hours.  Wash your hands after voiding.  Men should sit to urinate.  Dispose of any soiled materials (flush down toilet or place in trash at home) during the first day.  Drink 6 to 8 glasses of fluids per day for 5 days to hydrate yourself.  If you need to see a doctor during the first week, you must let them know that you were treated with yttrium-90 microspheres, and will be slightly radioactive.  They can call Interventional Radiology [phone] with any questions.Moderate Sedation, Adult Moderate sedation is given to help you relax or even sleep through a procedure. You may remain sleepy, be clumsy, or have poor balance for several hours following this procedure. Arrange for a responsible adult, family member, or friend to take you home. A responsible adult should stay with you for at least 24 hours or until the medicines have worn off.  Do not participate in any activities where you could become injured for the next 24 hours, or until you feel normal again. Do not:  Drive.  Swim.  Ride a bicycle.  Operate heavy machinery.  Cook.  Use power  tools.  Climb ladders.  Work at [employer].  Do not make important decisions or sign legal documents until you are improved.  Vomiting may occur if you eat too soon. When you can drink without vomiting, try water, juice, or soup. Try solid foods if you feel little or no nausea.  Only take over-the-counter or prescription medications for pain, discomfort, or fever as directed by your caregiver.If pain medications have been prescribed for you, ask your caregiver how soon it is safe to take them.  Make sure you and your family fully understands everything about the medication given to you. Make sure you understand what side effects may occur.  You should not drink alcohol, take sleeping pills, or medications that cause drowsiness for at least 24 hours.  If you smoke, do not smoke alone.  If you are feeling better, you may resume normal activities 24 hours after receiving sedation.  Keep all appointments as scheduled. Follow all instructions.  Ask questions if you do not understand. SEEK MEDICAL CARE IF:   Your skin is pale or bluish in color.  You continue to feel sick to your stomach (nauseous) or throw up (vomit).  Your pain is getting worse and not helped by medication.  You have bleeding or swelling.  You are still sleepy or feeling clumsy after 24 hours. SEEK IMMEDIATE MEDICAL CARE IF:   You develop a rash.  You have difficulty breathing.  You develop  any type of allergic problem.  You have a fever. Document Released: 07/13/2001 Document Revised: 01/10/2012 Document Reviewed: 06/25/2013 [facility] Patient Information 2014 [place].

## 2013-12-12 NOTE — H&P (Signed)
Agree.  Patient seen.  For mapping arteriogram to check arterial patency, including previously embolized GDA.  Also will check for shunting to lungs with MAA injection today.

## 2013-12-12 NOTE — ED Notes (Signed)
Pt transported via bed to [facility] for recovery.

## 2013-12-12 NOTE — H&P (Signed)
Chief Complaint: "I'm here for an angiogram" Referring Physician:Ennever HPI: Brian Mays is an 69 y.o. male with hx of metastatic neuroendocrine carcinoma, previously treated with Y-90. He has seen Dr. Kathlene Cote for consideration of repeat treatment, underwent liver biopsy, which has again  proven to be metastatic neuroendocrine carcinoma. He is now scheduled for pre Y-90 angiogram and mapping with test dose. He has been well since his liver biopsy. He does have contrast allergy. He has done well with his past procedures. PMHx, meds reviewed.   Past Medical History:  Past Medical History  Diagnosis Date  . GERD (gastroesophageal reflux disease)   . Pancreatitis 1998    chronic  . HTN (hypertension)   . Hemorrhoid   . Hypothyroidism   . Colon polyps   . Metastatic carcinoma 2010    Dr Jonette Eva  . Diverticulosis   . Gallstones   . Hiatal hernia     Past Surgical History:  Past Surgical History  Procedure Laterality Date  . Cholecystectomy  1999  . Carcinoil resection  01/2009  . Appendectomy  1964    Family History:  Family History  Problem Relation Age of Onset  . Kidney disease Mother   . Hyperlipidemia Mother   . Hypertension Mother   . COPD Father   . Ulcerative colitis Daughter     Social History:  reports that he quit smoking about 16 years ago. His smoking use included Cigarettes. He smoked 0.00 packs per day. He has never used smokeless tobacco. He reports that he drinks alcohol. He reports that he does not use illicit drugs.  Allergies:  Allergies  Allergen Reactions  . Iohexol      Code: HIVES, Desc: PER [name] @ [facility], PT IS ALLERGIC TO CONTRAST DYE 10/02/08/RM  05/01/10...needs full premeds per our protocol w/ [facility]., Onset Date: [id number]     Medications:   Medication List    ASK your doctor about these medications       amLODipine 5 MG tablet  Commonly known as:  NORVASC  Take 5 mg by mouth every morning.     aspirin 81 MG tablet   Take 81 mg by mouth daily.     Cholecalciferol 1000 UNITS tablet  Take 1,000 Units by mouth daily.     lansoprazole 30 MG capsule  Commonly known as:  PREVACID  Take 30 mg by mouth daily at 12 noon.     lansoprazole 30 MG capsule  Commonly known as:  PREVACID  TAKE ONE CAPSULE BY MOUTH EVERY DAY     levothyroxine 50 MCG tablet  Commonly known as:  SYNTHROID, LEVOTHROID  Take 50 mcg by mouth daily before breakfast.     lipase/protease/amylase 12000 UNITS Cpep capsule  Commonly known as:  CREON-12/PANCREASE  Take 1 capsule by mouth 3 (three) times daily before meals.     losartan 100 MG tablet  Commonly known as:  COZAAR  Take 100 mg by mouth every morning.     metoCLOPramide 10 MG tablet  Commonly known as:  REGLAN  Take 10 mg by mouth 4 (four) times daily.     PARoxetine 10 MG tablet  Commonly known as:  PAXIL  Take 10 mg by mouth every morning.     SANDOSTATIN IJ  Inject as directed every 28 (twenty-eight) days.     vitamin B-12 500 MCG tablet  Commonly known as:  CYANOCOBALAMIN  Take 500 mcg by mouth 2 (two) times daily.  Please HPI for pertinent positives, otherwise complete 10 system ROS negative.  Physical Exam: BP 143/95  Pulse 72  Temp(Src) 98.1 F (36.7 C) (Oral)  Resp 18  SpO2 98% There is no weight on file to calculate BMI.   General Appearance:  Alert, cooperative, no distress, appears stated age  Head:  Normocephalic, without obvious abnormality, atraumatic  ENT: Unremarkable  Neck: Supple, symmetrical, trachea midline  Lungs:   Clear to auscultation bilaterally, no w/r/r, respirations unlabored without use of accessory muscles.  Chest Wall:  No tenderness or deformity  Heart:  Regular rate and rhythm, S1, S2 normal, no murmur, rub or gallop.  Abdomen:   Soft, non-tender, non distended.  Extremities: Extremities normal, atraumatic, no cyanosis or edema  Pulses: 2+ and symmetric femoral  Neurologic: Normal affect, no gross deficits.    Results for orders placed during the hospital encounter of 12/12/13 (from the past 24 hour(s))  APTT     Status: None   Collection Time    12/12/13  7:45 AM      Result Value Ref Range   aPTT 27  24 - 37 seconds  CBC WITH DIFFERENTIAL     Status: None   Collection Time    12/12/13  7:45 AM      Result Value Ref Range   WBC 6.5  4.0 - 10.5 K/uL   RBC 4.71  4.22 - 5.81 MIL/uL   Hemoglobin 13.5  13.0 - 17.0 g/dL   HCT 40.7  39.0 - 52.0 %   MCV 86.4  78.0 - 100.0 fL   MCH 28.7  26.0 - 34.0 pg   MCHC 33.2  30.0 - 36.0 g/dL   RDW 14.6  11.5 - 15.5 %   Platelets 195  150 - 400 K/uL   Neutrophils Relative % 66  43 - 77 %   Neutro Abs 4.3  1.7 - 7.7 K/uL   Lymphocytes Relative 23  12 - 46 %   Lymphs Abs 1.5  0.7 - 4.0 K/uL   Monocytes Relative 7  3 - 12 %   Monocytes Absolute 0.5  0.1 - 1.0 K/uL   Eosinophils Relative 3  0 - 5 %   Eosinophils Absolute 0.2  0.0 - 0.7 K/uL   Basophils Relative 1  0 - 1 %   Basophils Absolute 0.0  0.0 - 0.1 K/uL  COMPREHENSIVE METABOLIC PANEL     Status: Abnormal   Collection Time    12/12/13  7:45 AM      Result Value Ref Range   Sodium 140  137 - 147 mEq/L   Potassium 4.1  3.7 - 5.3 mEq/L   Chloride 101  96 - 112 mEq/L   CO2 29  19 - 32 mEq/L   Glucose, Bld 112 (*) 70 - 99 mg/dL   BUN 14  6 - 23 mg/dL   Creatinine, Ser 0.79  0.50 - 1.35 mg/dL   Calcium 9.3  8.4 - 10.5 mg/dL   Total Protein 7.7  6.0 - 8.3 g/dL   Albumin 3.6  3.5 - 5.2 g/dL   AST 33  0 - 37 U/L   ALT 34  0 - 53 U/L   Alkaline Phosphatase 68  39 - 117 U/L   Total Bilirubin 0.6  0.3 - 1.2 mg/dL   GFR calc non Af Amer >90  >90 mL/min   GFR calc Af Amer >90  >90 mL/min  PROTIME-INR     Status: None   Collection  Time    12/12/13  7:45 AM      Result Value Ref Range   Prothrombin Time 12.6  11.6 - 15.2 seconds   INR 0.96  0.00 - 1.49     Assessment/Plan Metastatic neuroendocrine carcinoma For Visceral angiogram with possible repeat coil embo, followed by test dose of  radioembolization. Explained procedure, risks, complications, use of sedation. Labs reviewed. Will give IV solumedrol and Benadryl pre-procedure for contrast allergy Consent signed in chart  Ascencion Dike PA-C 12/12/2013, 8:22 AM

## 2013-12-12 NOTE — ED Notes (Addendum)
5Fr sheath removed from R femoral artery by Dr. Kathlene Cote.  Hemostasis achieved using Exoseal device.  R groin level 0, 1+RDP.  Manual pressure applied X5 mins by Alfred Levins, RT.

## 2013-12-13 ENCOUNTER — Other Ambulatory Visit: Payer: Self-pay | Admitting: Radiology

## 2013-12-13 ENCOUNTER — Encounter (HOSPITAL_COMMUNITY): Payer: Self-pay | Admitting: Pharmacy Technician

## 2013-12-18 LAB — CHROMOGRANIN A: CHROMOGRANIN A: 16.8 ng/mL — AB (ref 1.9–15.0)

## 2013-12-19 ENCOUNTER — Ambulatory Visit (HOSPITAL_BASED_OUTPATIENT_CLINIC_OR_DEPARTMENT_OTHER): Payer: Medicare PPO

## 2013-12-19 ENCOUNTER — Ambulatory Visit (HOSPITAL_BASED_OUTPATIENT_CLINIC_OR_DEPARTMENT_OTHER): Payer: Medicare PPO | Admitting: Lab

## 2013-12-19 ENCOUNTER — Ambulatory Visit (HOSPITAL_BASED_OUTPATIENT_CLINIC_OR_DEPARTMENT_OTHER): Payer: Medicare PPO | Admitting: Hematology & Oncology

## 2013-12-19 DIAGNOSIS — C7A00 Malignant carcinoid tumor of unspecified site: Secondary | ICD-10-CM

## 2013-12-19 DIAGNOSIS — C787 Secondary malignant neoplasm of liver and intrahepatic bile duct: Secondary | ICD-10-CM

## 2013-12-19 DIAGNOSIS — E340 Carcinoid syndrome: Secondary | ICD-10-CM

## 2013-12-19 DIAGNOSIS — D499 Neoplasm of unspecified behavior of unspecified site: Secondary | ICD-10-CM

## 2013-12-19 LAB — CMP (CANCER CENTER ONLY)
ALBUMIN: 3.4 g/dL (ref 3.3–5.5)
ALK PHOS: 59 U/L (ref 26–84)
ALT: 26 U/L (ref 10–47)
AST: 26 U/L (ref 11–38)
BUN, Bld: 12 mg/dL (ref 7–22)
CO2: 30 meq/L (ref 18–33)
Calcium: 9.4 mg/dL (ref 8.0–10.3)
Chloride: 105 mEq/L (ref 98–108)
Creat: 0.7 mg/dl (ref 0.6–1.2)
Glucose, Bld: 106 mg/dL (ref 73–118)
POTASSIUM: 3.8 meq/L (ref 3.3–4.7)
SODIUM: 142 meq/L (ref 128–145)
TOTAL PROTEIN: 7.9 g/dL (ref 6.4–8.1)
Total Bilirubin: 0.9 mg/dl (ref 0.20–1.60)

## 2013-12-19 LAB — CBC WITH DIFFERENTIAL (CANCER CENTER ONLY)
BASO#: 0 10*3/uL (ref 0.0–0.2)
BASO%: 0.4 % (ref 0.0–2.0)
EOS ABS: 0.3 10*3/uL (ref 0.0–0.5)
EOS%: 3.1 % (ref 0.0–7.0)
HCT: 42.9 % (ref 38.7–49.9)
HEMOGLOBIN: 14 g/dL (ref 13.0–17.1)
LYMPH#: 1.3 10*3/uL (ref 0.9–3.3)
LYMPH%: 15.7 % (ref 14.0–48.0)
MCH: 28.3 pg (ref 28.0–33.4)
MCHC: 32.6 g/dL (ref 32.0–35.9)
MCV: 87 fL (ref 82–98)
MONO#: 0.7 10*3/uL (ref 0.1–0.9)
MONO%: 8.3 % (ref 0.0–13.0)
NEUT%: 72.5 % (ref 40.0–80.0)
NEUTROS ABS: 5.9 10*3/uL (ref 1.5–6.5)
Platelets: 214 10*3/uL (ref 145–400)
RBC: 4.94 10*6/uL (ref 4.20–5.70)
RDW: 14.8 % (ref 11.1–15.7)
WBC: 8.1 10*3/uL (ref 4.0–10.0)

## 2013-12-19 MED ORDER — OCTREOTIDE ACETATE 30 MG IM KIT
PACK | INTRAMUSCULAR | Status: AC
  Filled 2013-12-19: qty 1

## 2013-12-19 MED ORDER — OCTREOTIDE ACETATE 30 MG IM KIT
30.0000 mg | PACK | Freq: Once | INTRAMUSCULAR | Status: AC
  Administered 2013-12-19: 30 mg via INTRAMUSCULAR

## 2013-12-19 NOTE — Progress Notes (Signed)
Hematology and Oncology Follow Up Visit  Brian Mays [id number] May 06, 1946 68 y.o. 12/19/2013   Principle Diagnosis:   Metastatic carcinoid-liver metastases  Current Therapy:    #1 Sandostatin LAR 30 mg IM every month  #2 patient to receive intrahepatic Y.-90 therapy.     Interim History:  Mr.  Brian Mays is in for followup. Last saw about a month ago. Our workup ensued for progressive disease. His chromogranin A level was up to 23.  We did scans. Scans show that he had progressive liver metastases.  We did a biopsy. The biopsy confirmed carcinoid. There is no transformation to a more aggressive form of carcinoid. His disease outside the liver was stable.  He saw our interventional radiology. They could do another intrahepatic Y.-90 therapy. His last one was several years ago and he got a great response with this.  He still has issues with "gas". He faces might be from the carcinoid. I would think that he has had a stat will help with this.  He's working. He's not having cough. Is not flushing. There is no diarrhea.     Medications: Current outpatient prescriptions:amLODipine (NORVASC) 5 MG tablet, Take 5 mg by mouth every morning. , Disp: , Rfl: ;  aspirin EC 81 MG tablet, Take 81 mg by mouth every morning., Disp: , Rfl: ;  Cholecalciferol 1000 UNITS tablet, Take 1,000 Units by mouth daily.  , Disp: , Rfl: ;  lansoprazole (PREVACID) 30 MG capsule, Take 30 mg by mouth daily at 12 noon., Disp: , Rfl:  levothyroxine (SYNTHROID, LEVOTHROID) 50 MCG tablet, Take 50 mcg by mouth daily before breakfast., Disp: , Rfl: ;  lipase/protease/amylase (CREON-12/PANCREASE) 12000 UNITS CPEP capsule, Take 1 capsule by mouth 3 (three) times daily before meals., Disp: , Rfl: ;  losartan (COZAAR) 100 MG tablet, Take 100 mg by mouth every morning., Disp: , Rfl: ;  metoCLOPramide (REGLAN) 10 MG tablet, Take 10 mg by mouth 4 (four) times daily., Disp: , Rfl:  Octreotide Acetate (SANDOSTATIN IJ), Inject 1  application as directed every 28 (twenty-eight) days. , Disp: , Rfl: ;  PARoxetine (PAXIL) 10 MG tablet, Take 10 mg by mouth every morning., Disp: , Rfl: ;  vitamin B-12 (CYANOCOBALAMIN) 500 MCG tablet, Take 500 mcg by mouth 2 (two) times daily., Disp: , Rfl:   Allergies:  Allergies  Allergen Reactions  . Iohexol      Code: HIVES, Desc: PER [name] @ [facility], PT IS ALLERGIC TO CONTRAST DYE 10/02/08/RM  05/01/10...needs full premeds per our protocol w/ [facility] imaging., Onset Date: [id number]     Past Medical History, Surgical history, Social history, and Family History were reviewed and updated.  Review of Systems: As above  Physical Exam:  vitals were not taken for this visit.  This is a well-developed well-nourished white gentleman in no obvious distress. Vital signs are temperature of 98.1. Pulse 81. Blood pressure 138/79. Weight is 210 pounds. Head and neck exam shows no ocular or oral lesions. There is a palpable cervical or supraclavicular lymph nodes. Lungs are clear. Cardiac exam regular rate and rhythm with no murmurs rubs or bruits. Abdomen is soft. Has good bowel sounds. There is no fluid wave. There is no palpable hepato- splenomegaly. His well-healed laparotomy scar. Back exam no tenderness over the spine ribs or hips. Extremities no clubbing cyanosis or edema. Neurological exam no focal neurological deficits. Skin exam no rashes.  Lab Results  Component Value Date   WBC 8.1 12/19/2013   HGB 14.0  12/19/2013   HCT 42.9 12/19/2013   MCV 87 12/19/2013   PLT 214 12/19/2013     Chemistry      Component Value Date/Time   NA 142 12/19/2013 1039   NA 140 12/12/2013 0745   K 3.8 12/19/2013 1039   K 4.1 12/12/2013 0745   CL 105 12/19/2013 1039   CL 101 12/12/2013 0745   CO2 30 12/19/2013 1039   CO2 29 12/12/2013 0745   BUN 12 12/19/2013 1039   BUN 14 12/12/2013 0745   CREATININE 0.7 12/19/2013 1039   CREATININE 0.79 12/12/2013 0745      Component Value Date/Time   CALCIUM 9.4 12/19/2013 1039    CALCIUM 9.3 12/12/2013 0745   ALKPHOS 59 12/19/2013 1039   ALKPHOS 68 12/12/2013 0745   AST 26 12/19/2013 1039   AST 33 12/12/2013 0745   ALT 26 12/19/2013 1039   ALT 34 12/12/2013 0745   BILITOT 0.90 12/19/2013 1039   BILITOT 0.6 12/12/2013 0745         Impression and Plan: [name] is a 89 shouldered (he had metastatic carcinoid. His disease has been fairly stable. However he now is progressing. His disease progression is in the liver.  We will still focused on intrahepatic therapy. I do not see where we have to move him off to systemic chemotherapy. Disease outside the liver has been very stable.  He goes for his intrahepatic treatment tomorrow. He told me that they may need to do a second treatment to get a second tumor in the liver. Again I don't see a problem with this from my point of view.  We will give him his Sandostatin today.  I will plan to see him back in another month. This also can change depending on his liver response and any side effects.   Volanda Napoleon, MD 2/18/201511:31 AM

## 2013-12-19 NOTE — Patient Instructions (Signed)

## 2013-12-20 ENCOUNTER — Ambulatory Visit (HOSPITAL_COMMUNITY)
Admission: RE | Admit: 2013-12-20 | Discharge: 2013-12-20 | Disposition: A | Discharge status: Home / Self Care | Payer: Medicare PPO | Source: Ambulatory Visit | Attending: Interventional Radiology | Admitting: Interventional Radiology

## 2013-12-20 ENCOUNTER — Telehealth: Payer: Self-pay | Admitting: Hematology & Oncology

## 2013-12-20 ENCOUNTER — Encounter (HOSPITAL_COMMUNITY)
Admission: RE | Admit: 2013-12-20 | Discharge: 2013-12-20 | Disposition: A | Discharge status: Home / Self Care | Payer: Medicare PPO | Source: Ambulatory Visit | Attending: Interventional Radiology | Admitting: Interventional Radiology

## 2013-12-20 ENCOUNTER — Other Ambulatory Visit: Payer: Self-pay | Admitting: Interventional Radiology

## 2013-12-20 ENCOUNTER — Encounter (HOSPITAL_COMMUNITY): Payer: Self-pay

## 2013-12-20 VITALS — BP 126/70 | HR 88 | Temp 97.6°F | Resp 16

## 2013-12-20 DIAGNOSIS — C787 Secondary malignant neoplasm of liver and intrahepatic bile duct: Secondary | ICD-10-CM | POA: Insufficient documentation

## 2013-12-20 DIAGNOSIS — K219 Gastro-esophageal reflux disease without esophagitis: Secondary | ICD-10-CM | POA: Insufficient documentation

## 2013-12-20 DIAGNOSIS — Z91041 Radiographic dye allergy status: Secondary | ICD-10-CM | POA: Insufficient documentation

## 2013-12-20 DIAGNOSIS — C7A00 Malignant carcinoid tumor of unspecified site: Secondary | ICD-10-CM | POA: Insufficient documentation

## 2013-12-20 DIAGNOSIS — E039 Hypothyroidism, unspecified: Secondary | ICD-10-CM | POA: Insufficient documentation

## 2013-12-20 DIAGNOSIS — Z79899 Other long term (current) drug therapy: Secondary | ICD-10-CM | POA: Insufficient documentation

## 2013-12-20 DIAGNOSIS — C7B8 Other secondary neuroendocrine tumors: Secondary | ICD-10-CM

## 2013-12-20 DIAGNOSIS — Z87891 Personal history of nicotine dependence: Secondary | ICD-10-CM | POA: Insufficient documentation

## 2013-12-20 DIAGNOSIS — C7A8 Other malignant neuroendocrine tumors: Secondary | ICD-10-CM

## 2013-12-20 DIAGNOSIS — D499 Neoplasm of unspecified behavior of unspecified site: Secondary | ICD-10-CM

## 2013-12-20 DIAGNOSIS — I10 Essential (primary) hypertension: Secondary | ICD-10-CM | POA: Insufficient documentation

## 2013-12-20 DIAGNOSIS — I7779 Dissection of other artery: Secondary | ICD-10-CM | POA: Insufficient documentation

## 2013-12-20 IMAGING — XA IR EMBO TUMOR ORGAN ISCHEMIA INFARCT INC GUIDE ROADMAPPING
12 of 19 series · 12 of 24 positions shown · non-contrast
Comparison: none

CLINICAL DATA: Progression of metastatic neuroendocrine carcinoma
to the liver and status post prior Yttrium 90 radioembolization of
the right lobe of the liver in [DATE]. Mapping arteriography
with branch vessel embolization was performed on [DATE]. The
patient now presents for treatment.

[Series 1: fl - angio · 1 of 45 frames shown (1 of 7)]
[frame 39/45]
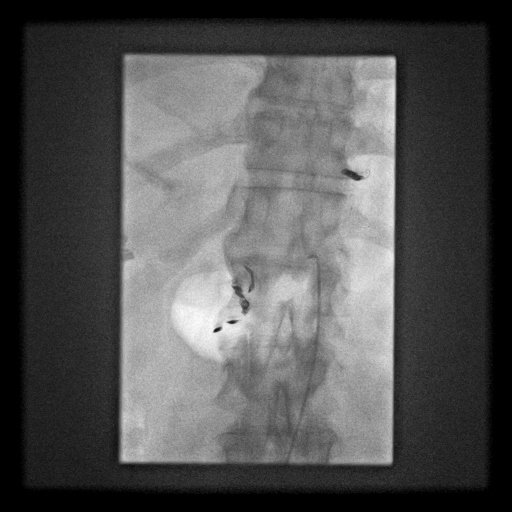

[Series 4: care body 4 · 1 of 25 frames shown (1 of 3)]
[frame 4/25]
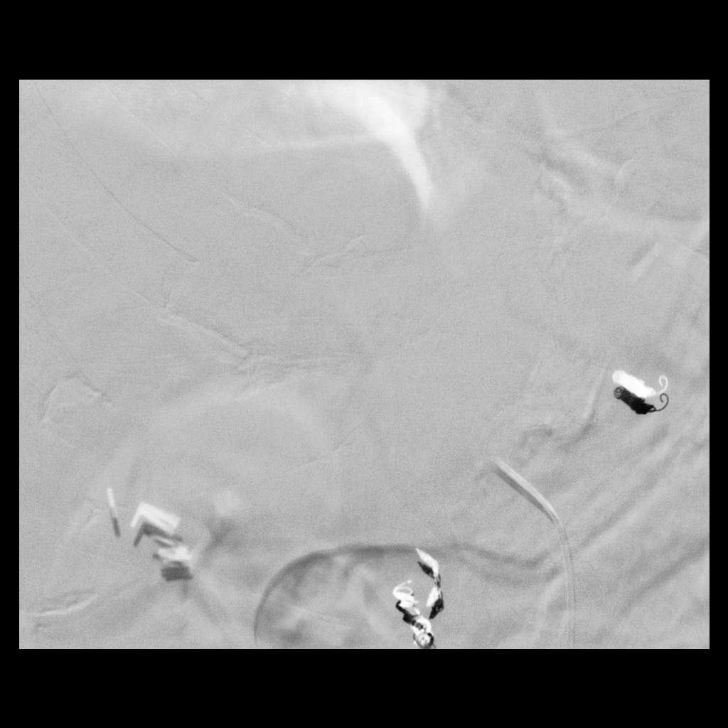

[Series 6: care body 4 · 1 of 27 frames shown (2 of 3)]
[frame 23/27]
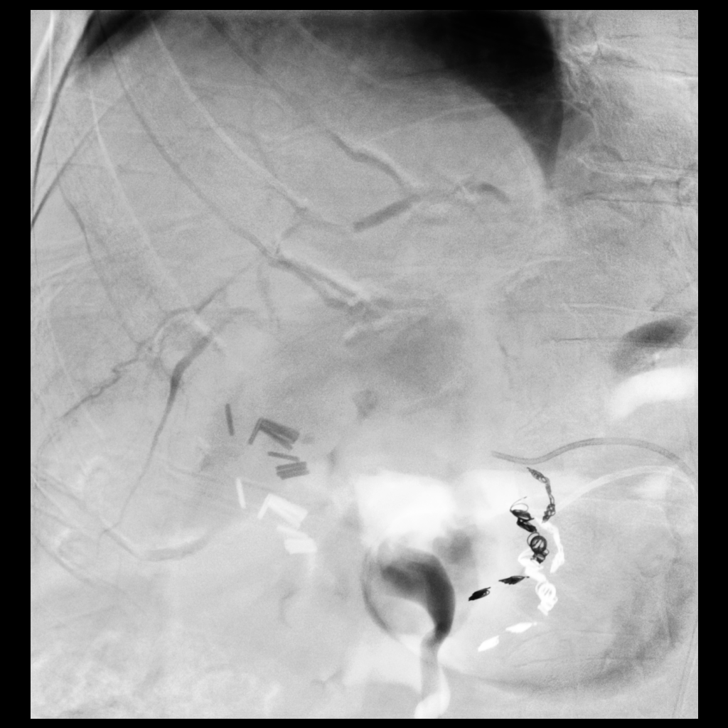

[Series 8: care body 4 · 1 of 21 frames shown (3 of 3)]
[frame 4/21]
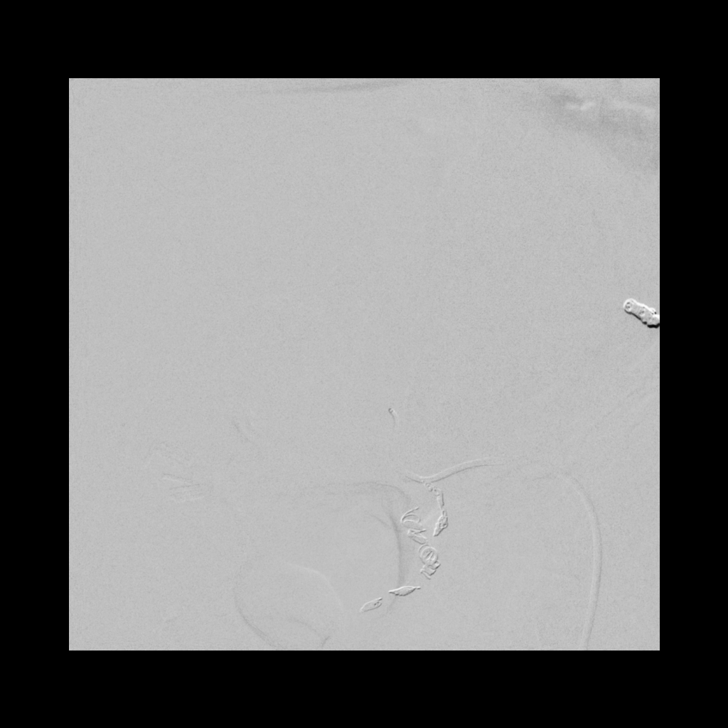

[Series 9: fl - angio · 1 of 146 frames shown (2 of 7)]
[frame 125/146]
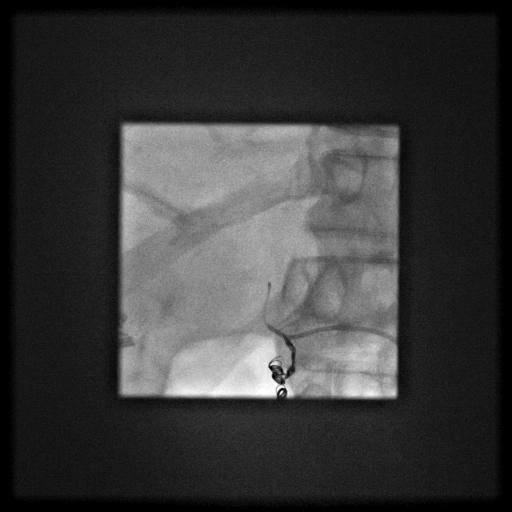

[Series 11: fl - angio · 1 of 298 frames shown (3 of 7)]
[frame 45/298]
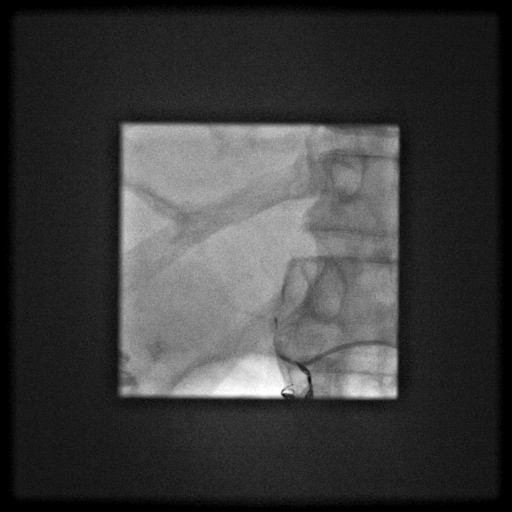

[Series 13: fl - angio · 1 of 272 frames shown (4 of 7)]
[frame 11/272]
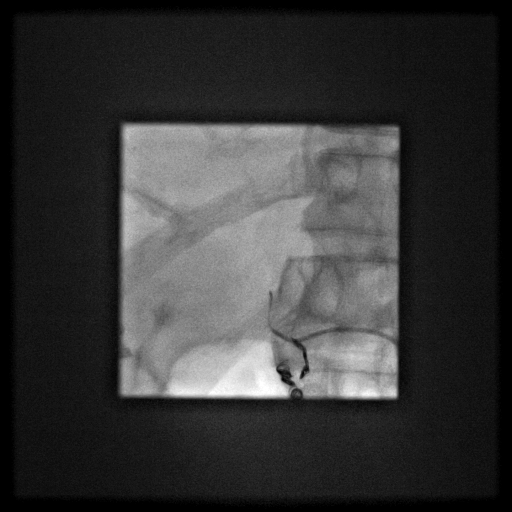

[Series 14: fl - angio · 1 of 313 frames shown (5 of 7)]
[frame 157/313]
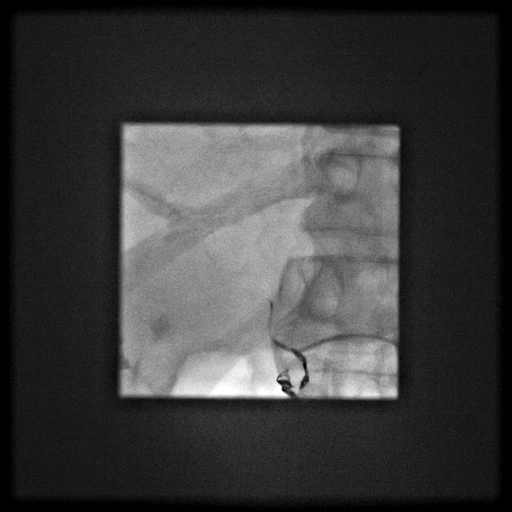

[Series 16: fl - angio · 1 of 259 frames shown (6 of 7)]
[frame 39/259]
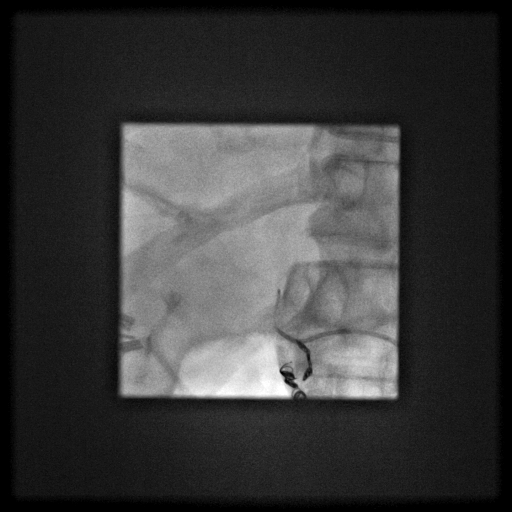

[Series 17: fl - angio · 1 of 293 frames shown (7 of 7)]
[frame 249/293]
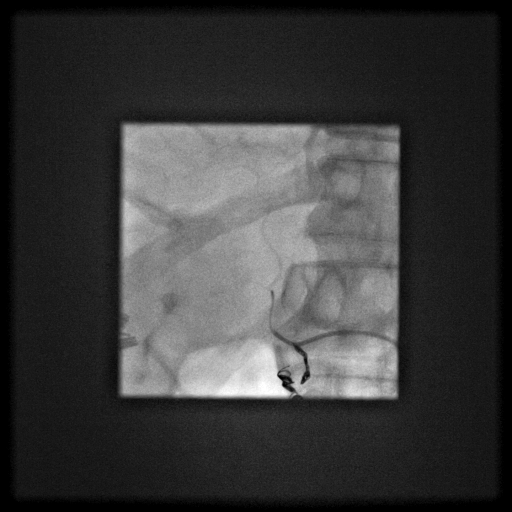

[Series 100: ir embo tumor organ ischemia infarct inc · 1 of 4 slices shown (1 of 2)]
[im 1/4]
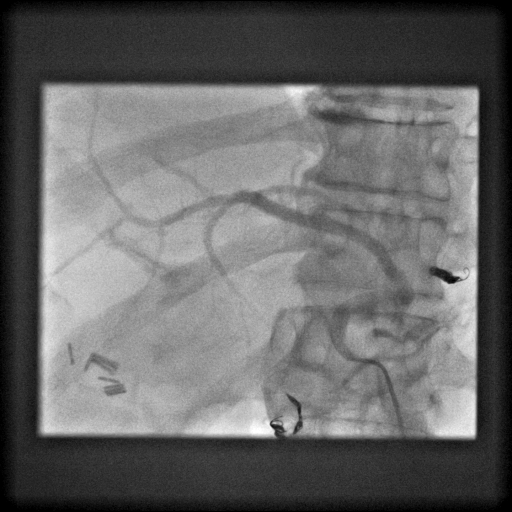

[Series 300: ir embo tumor organ ischemia infarct inc · 1 of 5 slices shown (2 of 2)]
[im 5/5]
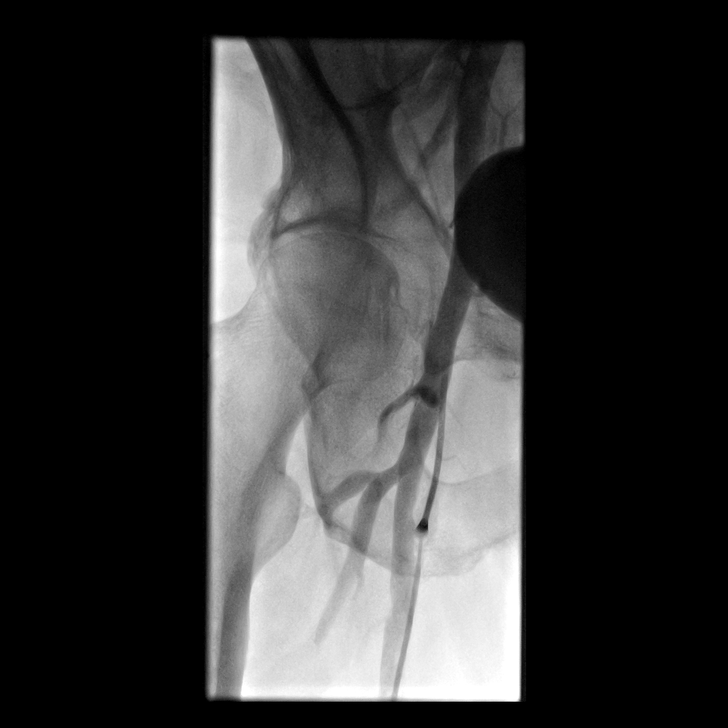

[12 of 24 positions shown; findings below may reference images not displayed]

EXAM:
1. ULTRASOUND GUIDANCE FOR VASCULAR ACCESS OF THE RIGHT COMMON
FEMORAL ARTERY
2. HEPATIC ARTERIOGRAPHY WITH SELECTIVE CATHETERIZATION OF THE
CELIAC AXIS AND ARTERIOGRAPHY AT THE LEVEL OF THE LEFT HEPATIC
ARTERY
3. SELECTIVE ARTERIOGRAPHY OF AN ACCESSORY RIGHT HEPATIC ARTERY
4. TRANSCATHETER [44] RADIOEMBOLIZATION OF AN ACCESSORY RIGHT
HEPATIC ARTERY
5. FOLLOW-UP ANGIOGRAPHY AFTER RIGHT HEPATIC RADIOEMBOLIZATION

MEDICATIONS AND MEDICAL HISTORY:
4.0 mg IV Versed; 200 mcg IV Fentanyl.

Additional Medications: 3.375 grams IV Zosyn, 20 mg IV Decadron, 40
mg IV Protonix, 4 mg IV Zofran, 200 mg octreotide.

Y-90 dose: 19.8 mCi

ANESTHESIA/SEDATION:
Moderate sedation time: 2 hr and 45 min

CONTRAST:  75 ml [44]

FLUOROSCOPY TIME:  23 min and 18 seconds.

PROCEDURE:
The procedure, risks, benefits, and alternatives were explained to
the patient. Questions regarding the procedure were encouraged and
answered. The patient understands and consents to the procedure.

The right groin was prepped with Betadine in a sterile fashion, and
a sterile drape was applied covering the operative field. A sterile
gown and sterile gloves were used for the procedure. Local
anesthesia was provided with 1% Lidocaine. Ultrasound image
documentation was performed.

Access of the right common femoral artery was performed under
ultrasound guidance with a micropuncture [DATE]-French sheath was
placed. A 5-French Cobra catheter was advanced and used to
selectively catheterize the celiac axis. Ultimately, a 5 French
Kumpe catheter was advanced into the left hepatic artery and
selective arteriography performed. The catheter was further advanced
over a hydrophilic guidewire. Additional arteriography was
performed.

The catheter was then advanced into an accessory right hepatic
artery. Selective arteriography was performed. A micro catheter was
then advanced in the diagnostic catheter and further advanced into
the accessory right hepatic artery. The microcatheter was further
advanced into the right hepatic artery.

Radioembolization was performed with [44] SIR Spheres.
Particles were administered via a microcatheter utilizing a
completely enclosed system. Monitoring of antegrade flow was
performed during administration under fluoroscopy with use of
contrast intermittently. After administration of the full dose of
particles, the microcatheter was removed and discarded along with
the attached tubing and particle vial.

Femoral puncture site was assessed with oblique arteriography. The
sheath was re-prepped with Betadine. Arteriotomy closure was
performed with the Cordis ExoSeal device.

COMPLICATIONS:
Left hepatic artery dissection, as above.
FINDINGS: Initial arteriography shows durable occlusion of the gastroduodenal
artery, accessory hepatic duodenal branch and left hepatic gastric
branch after prior coil embolization. Based on vascular anatomy, it
is not possible to treat the entire liver in one setting. Initial
plan was to treat the left lobe of the liver today based on the
largest liver lesion present in the lateral segment of the left
lobe. However, during catheter advancement and guidewire
manipulation, a dissection of the left hepatic artery was present
which was clearly flow limiting and prevented left hepatic arterial
treatment today. The dissection will be left to heal.

The main lateral right hepatic artery was previously treated in
[44]. A separate accessory artery which functions more like a middle
hepatic artery with supply to the right and left lobes was
catheterized and does supply the second largest lesion currently in
the liver within the central liver measuring just over 3 cm by CT.
The inferior duodenal branch show durable occlusion and it was felt
that this artery could be safely treated. [44] microsphere
delivery via a micro catheter was successful with administration of
the full dose. Dosing was recalculated and recalibrated for today's
treatment after initial calculations based on the left lobe volume
and lesion burden.

Arteriotomy closure was initially successful in establishing
hemostasis. The patient will recover for 4 hours after the
procedure.
IMPRESSION: Hepatic arterial radioembolization performed with [44]
microspheres. As above, a left hepatic artery dissection occurred
with catheter and guidewire manipulation and the left hepatic artery
could not be treated today. Instead, an accessory right hepatic
artery branch which functions as a middle hepatic artery supplying
both right and left lobes was treated with administration of 20 mCi
of dose. Initial clinical follow-up will be performed in 4 weeks.

## 2013-12-20 MED ORDER — PANTOPRAZOLE SODIUM 40 MG IV SOLR
INTRAVENOUS | Status: AC
  Administered 2013-12-20: 40 mg
  Filled 2013-12-20: qty 40

## 2013-12-20 MED ORDER — SODIUM CHLORIDE 0.9 % IV SOLN
INTRAVENOUS | Status: DC
Start: 2013-12-20 — End: 2013-12-21

## 2013-12-20 MED ORDER — ONDANSETRON HCL 4 MG/2ML IJ SOLN
4.0000 mg | Freq: Once | INTRAMUSCULAR | Status: AC
  Administered 2013-12-20: 4 mg via INTRAVENOUS
  Filled 2013-12-20: qty 2

## 2013-12-20 MED ORDER — OCTREOTIDE ACETATE 100 MCG/ML IJ SOLN
200.0000 ug | Freq: Once | INTRAMUSCULAR | Status: AC
  Administered 2013-12-20: 200 ug via SUBCUTANEOUS
  Filled 2013-12-20 (×2): qty 2

## 2013-12-20 MED ORDER — DEXAMETHASONE SODIUM PHOSPHATE 10 MG/ML IJ SOLN
20.0000 mg | Freq: Once | INTRAMUSCULAR | Status: AC
  Administered 2013-12-20: 20 mg via INTRAVENOUS
  Filled 2013-12-20: qty 2

## 2013-12-20 MED ORDER — FENTANYL CITRATE 0.05 MG/ML IJ SOLN
INTRAMUSCULAR | Status: DC
Start: 2013-12-20 — End: 2013-12-26
  Filled 2013-12-20: qty 8

## 2013-12-20 MED ORDER — PANTOPRAZOLE SODIUM 40 MG IV SOLR
40.0000 mg | Freq: Once | INTRAVENOUS | Status: AC
  Administered 2013-12-20: 40 mg via INTRAVENOUS

## 2013-12-20 MED ORDER — IOHEXOL 300 MG/ML  SOLN
50.0000 mL | Freq: Once | INTRAMUSCULAR | Status: AC | PRN
  Administered 2013-12-20: 1 mL via INTRA_ARTERIAL

## 2013-12-20 MED ORDER — PIPERACILLIN-TAZOBACTAM 3.375 G IVPB
3.3750 g | Freq: Once | INTRAVENOUS | Status: AC
  Administered 2013-12-20: 3.375 g via INTRAVENOUS
  Filled 2013-12-20: qty 50

## 2013-12-20 MED ORDER — FENTANYL CITRATE 0.05 MG/ML IJ SOLN
INTRAMUSCULAR | Status: AC | PRN
  Administered 2013-12-20: 50 ug via INTRAVENOUS
  Administered 2013-12-20: 100 ug via INTRAVENOUS
  Administered 2013-12-20: 50 ug via INTRAVENOUS

## 2013-12-20 MED ORDER — YTTRIUM 90 INJECTION
19.8000 | INJECTION | Freq: Once | INTRAVENOUS | Status: AC
  Administered 2013-12-20: 20 via INTRAVENOUS

## 2013-12-20 MED ORDER — MIDAZOLAM HCL 2 MG/2ML IJ SOLN
INTRAMUSCULAR | Status: DC
Start: 2013-12-20 — End: 2013-12-26
  Filled 2013-12-20: qty 8

## 2013-12-20 MED ORDER — LIDOCAINE HCL 1 % IJ SOLN
INTRAMUSCULAR | Status: DC
Start: 2013-12-20 — End: 2013-12-21
  Filled 2013-12-20: qty 20

## 2013-12-20 MED ORDER — MIDAZOLAM HCL 2 MG/2ML IJ SOLN
INTRAMUSCULAR | Status: AC | PRN
  Administered 2013-12-20 (×2): 1 mg via INTRAVENOUS
  Administered 2013-12-20: 2 mg via INTRAVENOUS

## 2013-12-20 MED ORDER — SODIUM CHLORIDE 0.9 % IV SOLN
INTRAVENOUS | Status: DC
  Administered 2013-12-20: 09:00:00 via INTRAVENOUS

## 2013-12-20 NOTE — Progress Notes (Signed)
Per telephone call with Stacy RN, IR give IV push meds..( Decadron, Zofran and Protonix). Agreed she will give Sandostatin.

## 2013-12-20 NOTE — H&P (Signed)
Chief Complaint: "I am here for my liver treatment." HPI: Brian Mays is an 68 y.o. male with metastatic neuroendocrine carcinoma s/p pre Y-90 arteriogram with percutaneous coil embolization of separate accessory gastric artery off of the left hepatic artery and accessory duodenal artery off of the right hepatic artery. S/p successful administration of 5 mCi of technetium 61m MAA via the left hepatic artery on 12/12/13. Nuclear medicine on 12/12/13 revealed lung shunt fraction equals 1%. Small amount of extra hepatic activity along the lesser curvature of stomach and ejection localizes within the left hepatic lobe. He is here today for radioembolization of the metastatic lesion of left lobe of the liver. The patient does have an iodinated contrast allergy and did have pre-medication with prednisone and benadryl. He denies any right femoral access groin complications from pre Y-90. He denies any chest pain or shortness of breath. He denies any active bleeding. He denies any difficulty with previous sedation or history of sleep apnea.  Past Medical History:  Past Medical History  Diagnosis Date  . GERD (gastroesophageal reflux disease)   . Pancreatitis 1998    chronic  . HTN (hypertension)   . Hemorrhoid   . Hypothyroidism   . Colon polyps   . Metastatic carcinoma 2010    Dr Twanna Hy  . Diverticulosis   . Gallstones   . Hiatal hernia     Past Surgical History:  Past Surgical History  Procedure Laterality Date  . Cholecystectomy  1999  . Carcinoil resection  01/2009  . Appendectomy  1964    Family History:  Family History  Problem Relation Age of Onset  . Kidney disease Mother   . Hyperlipidemia Mother   . Hypertension Mother   . COPD Father   . Ulcerative colitis Daughter     Social History:  reports that he quit smoking about 16 years ago. His smoking use included Cigarettes. He smoked 0.00 packs per day. He has never used smokeless tobacco. He reports that he drinks alcohol. He  reports that he does not use illicit drugs.  Allergies:  Allergies  Allergen Reactions  . Iohexol      Code: HIVES, Desc: PER [name] @ [facility], PT IS ALLERGIC TO CONTRAST DYE 10/02/08/RM  05/01/10...needs full premeds per our protocol w/ [facility]., Onset Date: [id number]     Medications:   Medication List    ASK your doctor about these medications       amLODipine 5 MG tablet  Commonly known as:  NORVASC  Take 5 mg by mouth every morning.     aspirin EC 81 MG tablet  Take 81 mg by mouth every morning.     Cholecalciferol 1000 UNITS tablet  Take 1,000 Units by mouth daily.     lansoprazole 30 MG capsule  Commonly known as:  PREVACID  Take 30 mg by mouth daily at 12 noon.     levothyroxine 50 MCG tablet  Commonly known as:  SYNTHROID, LEVOTHROID  Take 50 mcg by mouth daily before breakfast.     lipase/protease/amylase 54098 UNITS Cpep capsule  Commonly known as:  CREON-12/PANCREASE  Take 1 capsule by mouth 3 (three) times daily before meals.     losartan 100 MG tablet  Commonly known as:  COZAAR  Take 100 mg by mouth every morning.     metoCLOPramide 10 MG tablet  Commonly known as:  REGLAN  Take 10 mg by mouth 4 (four) times daily.     PARoxetine 10 MG tablet  Commonly known as:  PAXIL  Take 10 mg by mouth every morning.     SANDOSTATIN IJ  Inject 1 application as directed every 28 (twenty-eight) days.     vitamin B-12 500 MCG tablet  Commonly known as:  CYANOCOBALAMIN  Take 500 mcg by mouth 2 (two) times daily.       Please HPI for pertinent positives, otherwise complete 10 system ROS negative.  Physical Exam: BP 134/82  Pulse 88  Temp(Src) 98.3 F (36.8 C) (Oral)  Resp 16  SpO2 94% There is no weight on file to calculate BMI.  General Appearance:  Alert, cooperative, no distress  Head:  Normocephalic, without obvious abnormality, atraumatic  Neck: Supple, symmetrical, trachea midline  Lungs:   Clear to auscultation bilaterally, no w/r/r,  respirations unlabored without use of accessory muscles.  Chest Wall:  No tenderness or deformity  Heart:  Regular rate and rhythm, S1, S2 normal, no murmur, rub or gallop.  Abdomen:   Soft, non-tender, non distended, (+) BS  Extremities: Extremities normal, atraumatic, no cyanosis or edema  Pulses: 1+ and symmetric  Neurologic: Normal affect, no gross deficits.   Results for orders placed in visit on 12/19/13 (from the past 48 hour(s))  CBC WITH DIFFERENTIAL ([place])     Status: None   Collection Time    12/19/13 10:39 AM      Result Value Ref Range   WBC 8.1  4.0 - 10.0 10e3/uL   RBC 4.94  4.20 - 5.70 10e6/uL   HGB 14.0  13.0 - 17.1 g/dL   HCT 42.9  38.7 - 49.9 %   MCV 87  82 - 98 fL   MCH 28.3  28.0 - 33.4 pg   MCHC 32.6  32.0 - 35.9 g/dL   RDW 14.8  11.1 - 15.7 %   Platelets 214  145 - 400 10e3/uL   NEUT# 5.9  1.5 - 6.5 10e3/uL   LYMPH# 1.3  0.9 - 3.3 10e3/uL   MONO# 0.7  0.1 - 0.9 10e3/uL   Eosinophils Absolute 0.3  0.0 - 0.5 10e3/uL   BASO# 0.0  0.0 - 0.2 10e3/uL   NEUT% 72.5  40.0 - 80.0 %   LYMPH% 15.7  14.0 - 48.0 %   MONO% 8.3  0.0 - 13.0 %   EOS% 3.1  0.0 - 7.0 %   BASO% 0.4  0.0 - 2.0 %  COMPREHENSIVE METABOLIC PANEL ([facility] REFLEX ONLY)     Status: None   Collection Time    12/19/13 10:39 AM      Result Value Ref Range   Sodium 142  128 - 145 mEq/L   Potassium 3.8  3.3 - 4.7 mEq/L   Chloride 105  98 - 108 mEq/L   CO2 30  18 - 33 mEq/L   Glucose, Bld 106  73 - 118 mg/dL   BUN, Bld 12  7 - 22 mg/dL   Creat 0.7  0.6 - 1.2 mg/dl   Total Bilirubin 0.90  0.20 - 1.60 mg/dl   Alkaline Phosphatase 59  26 - 84 U/L   AST 26  11 - 38 U/L   ALT(SGPT) 26  10 - 47 U/L   Total Protein 7.9  6.4 - 8.1 g/dL   Albumin 3.4  3.3 - 5.5 g/dL   Calcium 9.4  8.0 - 10.3 mg/dL   No results found.  Assessment/Plan Metastatic neuroendocrine carcinoma s/p pre Y-90 arteriogram with percutaneous coil embolization. S/p successful administration of 5 mCi  of technetium 86m MAA  via the left hepatic artery and nuclear medicine 12/12/13. Patient scheduled today for image guided radioembolization of metastatic lesion of the left lobe of the liver. Iodinated contrast allergy, premedicated.  Patient has been NPO, labs and images reviewed. Risks and Benefits discussed with the patient. All of the patient's questions were answered, patient is agreeable to proceed. Consent signed and in chart.   Tsosie Billing D PA-C 12/20/2013, 9:01 AM

## 2013-12-20 NOTE — Telephone Encounter (Signed)
[place]: [id number] Valid: 11/23/2013-02/21/2014 Status: Approved - [id number]

## 2013-12-20 NOTE — Progress Notes (Signed)
Patient  States he completed Prednisone as instructed prior to this procedure.

## 2013-12-20 NOTE — Discharge Instructions (Signed)
Post Y-90 Radioembolization Discharge Instructions  You have been given a radioactive material during your procedure.  While it is safe for you to be discharged home from the hospital, you need to proceed directly home.    Do not use public transportation, including air travel, lasting more than 2 hours for 1 week.  Avoid crowded public places for 1 week.  Adult visitors should try to avoid close contact with you for 1 week.    Children and pregnant females should not visit or have close contact with you for 1 week.  Items that you touch are not radioactive.  Do not sleep in the same bed as your partner for 1 week, and a condom should be used for sexual activity during the first 24 hours.  Your blood may be radioactive and caution should be used if any bleeding occurs during the recovery period.  Body fluids may be radioactive for 24 hours.  Wash your hands after voiding.  Men should sit to urinate.  Dispose of any soiled materials (flush down toilet or place in trash at home) during the first day.  Drink 6 to 8 glasses of fluids per day for 5 days to hydrate yourself.  If you need to see a doctor during the first week, you must let them know that you were treated with yttrium-90 microspheres, and will be slightly radioactive.  They can call Interventional Radiology [phone] with any questions.Post Y-90 Radioembolization Discharge Instructions  You have been given a radioactive material during your procedure.  While it is safe for you to be discharged home from the hospital, you need to proceed directly home.    Do not use public transportation, including air travel, lasting more than 2 hours for 1 week.  Avoid crowded public places for 1 week.  Adult visitors should try to avoid close contact with you for 1 week.    Children and pregnant females should not visit or have close contact with you for 1 week.  Items that you touch are not radioactive.  Do not sleep in the same bed  as your partner for 1 week, and a condom should be used for sexual activity during the first 24 hours.  Your blood may be radioactive and caution should be used if any bleeding occurs during the recovery period.  Body fluids may be radioactive for 24 hours.  Wash your hands after voiding.  Men should sit to urinate.  Dispose of any soiled materials (flush down toilet or place in trash at home) during the first day.  Drink 6 to 8 glasses of fluids per day for 5 days to hydrate yourself.  If you need to see a doctor during the first week, you must let them know that you were treated with yttrium-90 microspheres, and will be slightly radioactive.  They can call Interventional Radiology [phone] with any questions.Moderate Sedation, Adult Moderate sedation is given to help you relax or even sleep through a procedure. You may remain sleepy, be clumsy, or have poor balance for several hours following this procedure. Arrange for a responsible adult, family member, or friend to take you home. A responsible adult should stay with you for at least 24 hours or until the medicines have worn off.  Do not participate in any activities where you could become injured for the next 24 hours, or until you feel normal again. Do not:  Drive.  Swim.  Ride a bicycle.  Operate heavy machinery.  Cook.  Use power tools.  Climb ladders.  Work at [employer].  Do not make important decisions or sign legal documents until you are improved.  Vomiting may occur if you eat too soon. When you can drink without vomiting, try water, juice, or soup. Try solid foods if you feel little or no nausea.  Only take over-the-counter or prescription medications for pain, discomfort, or fever as directed by your caregiver.If pain medications have been prescribed for you, ask your caregiver how soon it is safe to take them.  Make sure you and your family fully understands everything about the medication given to you. Make sure you  understand what side effects may occur.  You should not drink alcohol, take sleeping pills, or medications that cause drowsiness for at least 24 hours.  If you smoke, do not smoke alone.  If you are feeling better, you may resume normal activities 24 hours after receiving sedation.  Keep all appointments as scheduled. Follow all instructions.  Ask questions if you do not understand. SEEK MEDICAL CARE IF:   Your skin is pale or bluish in color.  You continue to feel sick to your stomach (nauseous) or throw up (vomit).  Your pain is getting worse and not helped by medication.  You have bleeding or swelling.  You are still sleepy or feeling clumsy after 24 hours. SEEK IMMEDIATE MEDICAL CARE IF:   You develop a rash.  You have difficulty breathing.  You develop any type of allergic problem.  You have a fever. Document Released: 07/13/2001 Document Revised: 01/10/2012 Document Reviewed: 06/25/2013 [facility] Patient Information 2014 [place].

## 2013-12-20 NOTE — Procedures (Signed)
Procedure:  Y-90 radioembolization of liver Access:  Right CFA, 5 Fr sheath Findings:  During catheterization of left hepatic artery, flow limiting dissection caused by hydrophilic guidewire. Accessory right hepatic artery catheterized which also supplies some of left lobe.  Y-90 radioembolization performed into this artery with total dose of 20 mCi. No immediate complications. For NM imaging now followed by 4 hour bedrest.

## 2013-12-20 NOTE — H&P (Signed)
Agree.  For Y-90 radioembolization today.

## 2013-12-21 ENCOUNTER — Other Ambulatory Visit: Payer: Medicare Other | Admitting: Lab

## 2013-12-21 ENCOUNTER — Ambulatory Visit: Payer: Medicare Other

## 2013-12-21 ENCOUNTER — Ambulatory Visit: Payer: Medicare Other | Admitting: Hematology & Oncology

## 2013-12-25 LAB — CHROMOGRANIN A: Chromogranin A: 22 ng/mL — ABNORMAL HIGH (ref 1.9–15.0)

## 2014-01-09 ENCOUNTER — Other Ambulatory Visit (HOSPITAL_COMMUNITY): Payer: Self-pay | Admitting: Interventional Radiology

## 2014-01-09 DIAGNOSIS — C787 Secondary malignant neoplasm of liver and intrahepatic bile duct: Secondary | ICD-10-CM

## 2014-01-09 DIAGNOSIS — C7A1 Malignant poorly differentiated neuroendocrine tumors: Secondary | ICD-10-CM

## 2014-01-25 ENCOUNTER — Other Ambulatory Visit (HOSPITAL_BASED_OUTPATIENT_CLINIC_OR_DEPARTMENT_OTHER): Payer: Medicare PPO | Admitting: Lab

## 2014-01-25 ENCOUNTER — Ambulatory Visit (HOSPITAL_BASED_OUTPATIENT_CLINIC_OR_DEPARTMENT_OTHER): Payer: Medicare PPO | Admitting: Hematology & Oncology

## 2014-01-25 ENCOUNTER — Ambulatory Visit: Payer: Medicare PPO

## 2014-01-25 VITALS — BP 135/84 | HR 78 | Temp 97.1°F | Resp 18

## 2014-01-25 DIAGNOSIS — C787 Secondary malignant neoplasm of liver and intrahepatic bile duct: Secondary | ICD-10-CM

## 2014-01-25 DIAGNOSIS — D499 Neoplasm of unspecified behavior of unspecified site: Secondary | ICD-10-CM

## 2014-01-25 DIAGNOSIS — C7A00 Malignant carcinoid tumor of unspecified site: Secondary | ICD-10-CM

## 2014-01-25 LAB — CBC WITH DIFFERENTIAL (CANCER CENTER ONLY)
BASO#: 0 10*3/uL (ref 0.0–0.2)
BASO%: 0.5 % (ref 0.0–2.0)
EOS ABS: 0.3 10*3/uL (ref 0.0–0.5)
EOS%: 4.5 % (ref 0.0–7.0)
HCT: 43.1 % (ref 38.7–49.9)
HGB: 14.2 g/dL (ref 13.0–17.1)
LYMPH#: 0.6 10*3/uL — AB (ref 0.9–3.3)
LYMPH%: 9.7 % — AB (ref 14.0–48.0)
MCH: 29 pg (ref 28.0–33.4)
MCHC: 32.9 g/dL (ref 32.0–35.9)
MCV: 88 fL (ref 82–98)
MONO#: 0.4 10*3/uL (ref 0.1–0.9)
MONO%: 5.4 % (ref 0.0–13.0)
NEUT%: 79.9 % (ref 40.0–80.0)
NEUTROS ABS: 5.2 10*3/uL (ref 1.5–6.5)
PLATELETS: 159 10*3/uL (ref 145–400)
RBC: 4.9 10*6/uL (ref 4.20–5.70)
RDW: 14.4 % (ref 11.1–15.7)
WBC: 6.5 10*3/uL (ref 4.0–10.0)

## 2014-01-25 LAB — CMP (CANCER CENTER ONLY)
ALBUMIN: 3.3 g/dL (ref 3.3–5.5)
ALT(SGPT): 21 U/L (ref 10–47)
AST: 34 U/L (ref 11–38)
Alkaline Phosphatase: 64 U/L (ref 26–84)
BUN, Bld: 13 mg/dL (ref 7–22)
CALCIUM: 9.1 mg/dL (ref 8.0–10.3)
CHLORIDE: 96 meq/L — AB (ref 98–108)
CO2: 29 meq/L (ref 18–33)
Creat: 0.9 mg/dl (ref 0.6–1.2)
GLUCOSE: 170 mg/dL — AB (ref 73–118)
POTASSIUM: 3.6 meq/L (ref 3.3–4.7)
Sodium: 140 mEq/L (ref 128–145)
Total Bilirubin: 0.7 mg/dl (ref 0.20–1.60)
Total Protein: 7.7 g/dL (ref 6.4–8.1)

## 2014-01-25 MED ORDER — OCTREOTIDE ACETATE 30 MG IM KIT
30.0000 mg | PACK | Freq: Once | INTRAMUSCULAR | Status: AC
  Administered 2014-01-25: 30 mg via INTRAMUSCULAR

## 2014-01-25 MED ORDER — OCTREOTIDE ACETATE 30 MG IM KIT
PACK | INTRAMUSCULAR | Status: AC
  Filled 2014-01-25: qty 1

## 2014-01-25 NOTE — Progress Notes (Signed)
Hematology and Oncology Follow Up Visit  Brian Mays [id number] 11-26-1945 68 y.o. 01/25/2014   Principle Diagnosis:   Metastatic low grade neuroendocrine tumor-hepatic metastases  Current Therapy:    #1 patient status post second yttrium-90 intrahepatic therapy  #2 Sandostatin LAR 30 mg IM every month     Interim History:  Mr.  Brian Mays is back for followup. He had his second yttrium therapy in February. He did well with this. There were no complications as far as I can tell. Radiology as, as always, did a fantastic job. Hepatic arterial embolization was done. He'll followup with them next week.  He feels well. He a little bit of fatigue after the procedure. He had a little bit of abdominal pain. There is no nausea vomiting. He had no fever. There was no cough or shortness of breath.  He's had no diarrhea. She's had no constipation.  There's been no leg swelling. He's had no rashes.  His pre-chromogranin A level was 22. Medications: Current outpatient prescriptions:amLODipine (NORVASC) 5 MG tablet, Take 5 mg by mouth every morning. , Disp: , Rfl: ;  aspirin EC 81 MG tablet, Take 81 mg by mouth every morning., Disp: , Rfl: ;  Cholecalciferol 1000 UNITS tablet, Take 1,000 Units by mouth daily.  , Disp: , Rfl: ;  lansoprazole (PREVACID) 30 MG capsule, Take 30 mg by mouth daily at 12 noon., Disp: , Rfl:  levothyroxine (SYNTHROID, LEVOTHROID) 50 MCG tablet, Take 50 mcg by mouth daily before breakfast., Disp: , Rfl: ;  lipase/protease/amylase (CREON-12/PANCREASE) 12000 UNITS CPEP capsule, Take 1 capsule by mouth 3 (three) times daily before meals., Disp: , Rfl: ;  losartan (COZAAR) 100 MG tablet, Take 100 mg by mouth every morning., Disp: , Rfl: ;  metoCLOPramide (REGLAN) 10 MG tablet, Take 10 mg by mouth 4 (four) times daily., Disp: , Rfl:  Octreotide Acetate (SANDOSTATIN IJ), Inject 1 application as directed every 28 (twenty-eight) days. , Disp: , Rfl: ;  PARoxetine (PAXIL) 10 MG tablet, Take  10 mg by mouth every morning., Disp: , Rfl: ;  vitamin B-12 (CYANOCOBALAMIN) 500 MCG tablet, Take 500 mcg by mouth 2 (two) times daily., Disp: , Rfl:   Allergies:  Allergies  Allergen Reactions  . Iohexol      Code: HIVES, Desc: PER [name] @ [facility], PT IS ALLERGIC TO CONTRAST DYE 10/02/08/RM  05/01/10...needs full premeds per our protocol w/ [facility]., Onset Date: [id number]     Past Medical History, Surgical history, Social history, and Family History were reviewed and updated.  Review of Systems: As above  Physical Exam:  oral temperature is 97.1 F (36.2 C). His blood pressure is 135/84 and his pulse is 78. His respiration is 18.   Abdomen is soft. Has good bowel sounds. There is no tenderness in the right upper quadrant. He has no palpable liver or spleen tip. There is no fluid wave. Lungs are clear. Cardiac exam regular rate and rhythm. No murmurs are noted. Extremities no clubbing cyanosis or edema. Skin exam no rashes. Neurological exam no focal neurological deficits. If those are not palpable.  Lab Results  Component Value Date   WBC 6.5 01/25/2014   HGB 14.2 01/25/2014   HCT 43.1 01/25/2014   MCV 88 01/25/2014   PLT 159 01/25/2014     Chemistry      Component Value Date/Time   NA 140 01/25/2014 0823   NA 140 12/12/2013 0745   K 3.6 01/25/2014 0823   K 4.1 12/12/2013  0745   CL 96* 01/25/2014 0823   CL 101 12/12/2013 0745   CO2 29 01/25/2014 0823   CO2 29 12/12/2013 0745   BUN 13 01/25/2014 0823   BUN 14 12/12/2013 0745   CREATININE 0.9 01/25/2014 0823   CREATININE 0.79 12/12/2013 0745      Component Value Date/Time   CALCIUM 9.1 01/25/2014 0823   CALCIUM 9.3 12/12/2013 0745   ALKPHOS 64 01/25/2014 0823   ALKPHOS 68 12/12/2013 0745   AST 34 01/25/2014 0823   AST 33 12/12/2013 0745   ALT 21 01/25/2014 0823   ALT 34 12/12/2013 0745   BILITOT 0.70 01/25/2014 0823   BILITOT 0.6 12/12/2013 0745         Impression and Plan: [name] is 72 shouldered gentleman with a low-grade  neuroendocrine carcinoma. He's done incredibly well. We have been following him now probably for about 5 or 6 years. He had hepatic therapy probably through 4 years ago. He did well with this. I thought that he probably could tolerate another treatment. He has done this. Hopefully, we will see results.  I probably would not do any scans on him for about 3 months.  He will get his Sandostatin today.  I want to see him back myself in another month.   Brian Napoleon, MD 3/27/20159:30 AM

## 2014-01-25 NOTE — Patient Instructions (Signed)

## 2014-01-30 ENCOUNTER — Other Ambulatory Visit (HOSPITAL_COMMUNITY): Payer: Self-pay | Admitting: Interventional Radiology

## 2014-01-30 ENCOUNTER — Ambulatory Visit
Admission: RE | Admit: 2014-01-30 | Discharge: 2014-01-30 | Disposition: A | Discharge status: Home / Self Care | Payer: Medicare PPO | Source: Ambulatory Visit | Attending: Interventional Radiology | Admitting: Interventional Radiology

## 2014-01-30 DIAGNOSIS — C7A1 Malignant poorly differentiated neuroendocrine tumors: Secondary | ICD-10-CM

## 2014-01-30 DIAGNOSIS — C787 Secondary malignant neoplasm of liver and intrahepatic bile duct: Secondary | ICD-10-CM

## 2014-01-30 NOTE — Progress Notes (Signed)
Appetite:  Good.  Denies nausea, vomiting or diarrhea.  Occasional minimal RUQ discomfort.  Does not require pain meds.  Able to complete ADL's.  Occasionally needs to take rest intervals.   Sleeping:  Fair-good.  Up to BR 1-2 x's/night.    Larenda Reedy Riki R[place], [place] 01/30/2014 9:19 AM

## 2014-01-31 LAB — CHROMOGRANIN A: CHROMOGRANIN A: 20 ng/mL — AB (ref 1.9–15.0)

## 2014-02-06 ENCOUNTER — Other Ambulatory Visit: Payer: Self-pay | Admitting: Interventional Radiology

## 2014-02-06 DIAGNOSIS — C7B8 Other secondary neuroendocrine tumors: Principal | ICD-10-CM

## 2014-02-06 DIAGNOSIS — C7A8 Other malignant neuroendocrine tumors: Secondary | ICD-10-CM

## 2014-02-07 ENCOUNTER — Other Ambulatory Visit: Payer: Self-pay | Admitting: Interventional Radiology

## 2014-02-07 DIAGNOSIS — C7A8 Other malignant neuroendocrine tumors: Secondary | ICD-10-CM

## 2014-02-07 DIAGNOSIS — C7B8 Other secondary neuroendocrine tumors: Principal | ICD-10-CM

## 2014-02-18 ENCOUNTER — Other Ambulatory Visit: Payer: Self-pay | Admitting: Hematology & Oncology

## 2014-02-20 ENCOUNTER — Other Ambulatory Visit: Payer: Medicare PPO | Admitting: Lab

## 2014-02-21 ENCOUNTER — Telehealth: Payer: Self-pay | Admitting: Radiology

## 2014-02-21 ENCOUNTER — Encounter (HOSPITAL_COMMUNITY): Payer: Self-pay

## 2014-02-21 ENCOUNTER — Other Ambulatory Visit (HOSPITAL_COMMUNITY): Payer: Self-pay | Admitting: Interventional Radiology

## 2014-02-21 ENCOUNTER — Ambulatory Visit (HOSPITAL_COMMUNITY)
Admission: RE | Admit: 2014-02-21 | Discharge: 2014-02-21 | Disposition: A | Discharge status: Home / Self Care | Payer: Medicare PPO | Source: Ambulatory Visit | Attending: Interventional Radiology | Admitting: Interventional Radiology

## 2014-02-21 DIAGNOSIS — C787 Secondary malignant neoplasm of liver and intrahepatic bile duct: Secondary | ICD-10-CM | POA: Insufficient documentation

## 2014-02-21 DIAGNOSIS — C7A1 Malignant poorly differentiated neuroendocrine tumors: Secondary | ICD-10-CM

## 2014-02-21 IMAGING — CT CT ABDOMEN WO/W CM
3 of 12 series · 12 of 46 positions shown, 18 images · IV contrast (OMNIPAQUE)
Comparison: CT of the abdomen and pelvis [DATE].

CLINICAL DATA: Intestinal carcinoid with history of liver
metastasis status post Y-90 therapy.

EXAM:
CT ABDOMEN WITHOUT AND WITH CONTRAST
TECHNIQUE: Multidetector CT imaging of the abdomen was performed following the
standard protocol before and following the bolus administration of
intravenous contrast.
CONTRAST:  100mL OMNIPAQUE IOHEXOL 350 MG/ML SOLN

[Series 2: liver w/o · axial · non-contrast · 0.74mm/px · z∈[-268,-103]mm · 4 of 55 slices shown]
[im 11/55  soft-tissue]
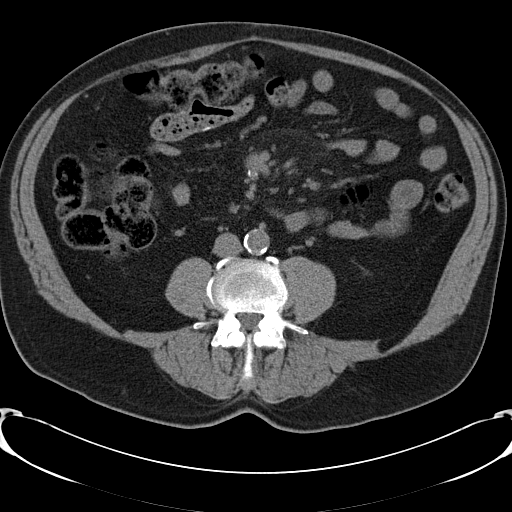
[im 22/55  soft-tissue]
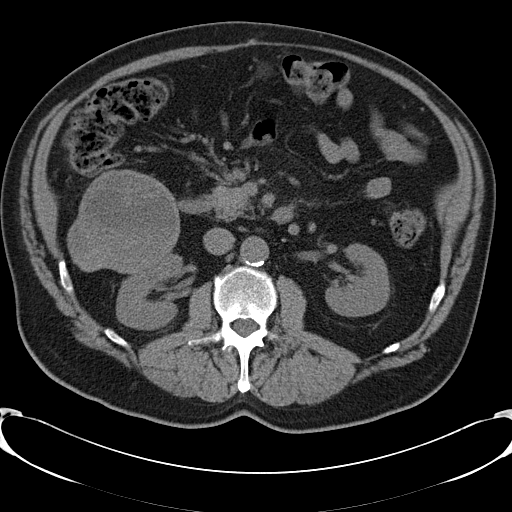
[im 33/55  soft-tissue]
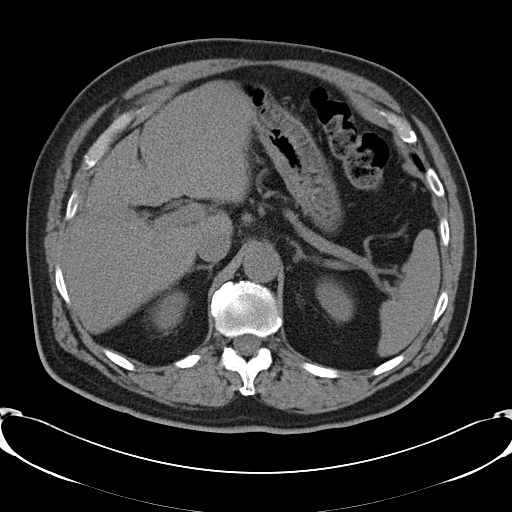
[im 44/55  soft-tissue]
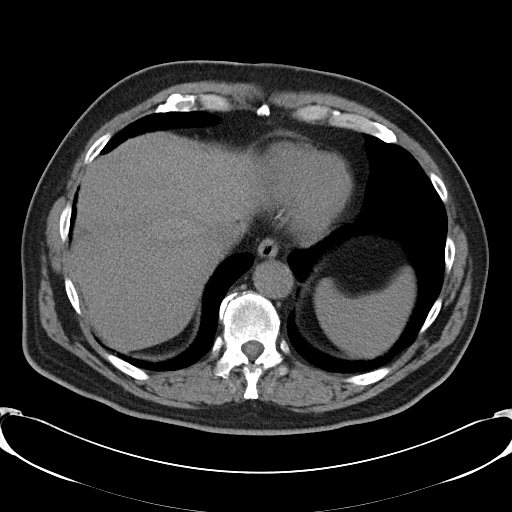

[Series 8: liver portal venous · axial · portal-venous · 0.74mm/px · z∈[-249,-84]mm · 6 of 79 slices shown, 11 images]
[im 12/79  soft-tissue]
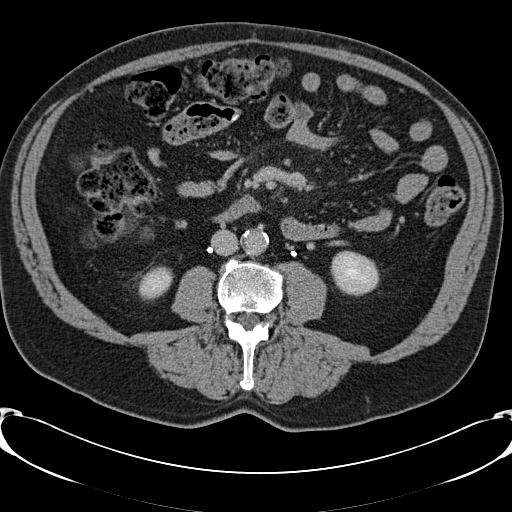
[im 12/79  bone]
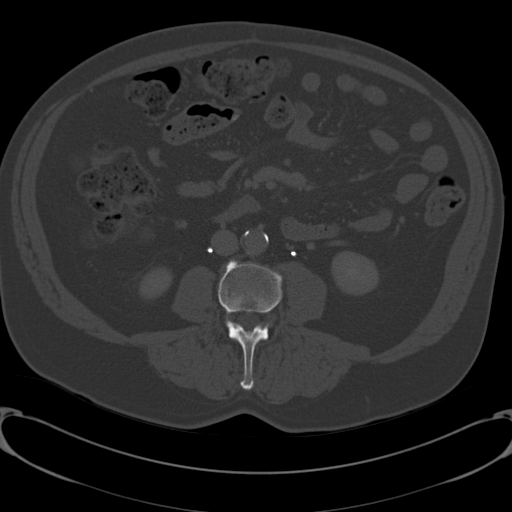
[im 23/79  soft-tissue]
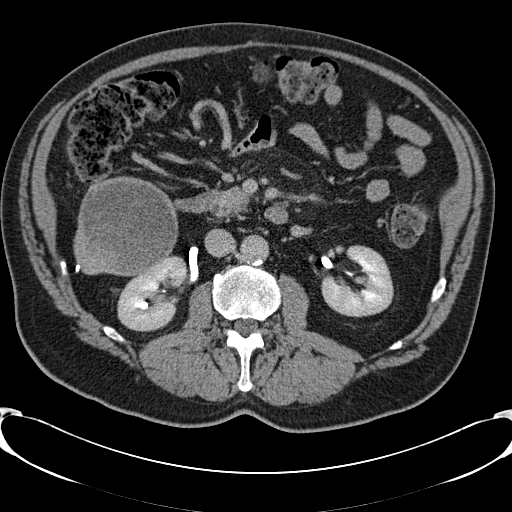
[im 34/79  soft-tissue]
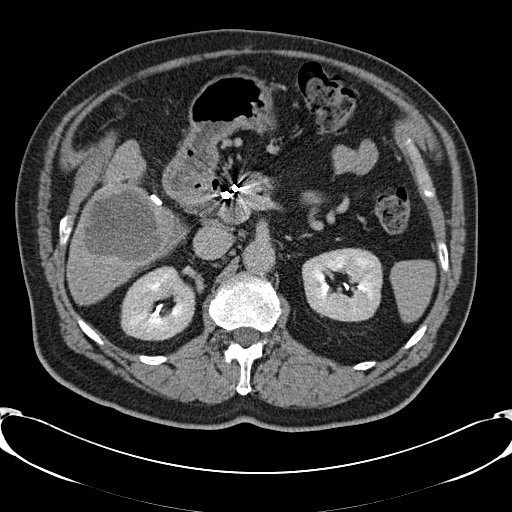
[im 34/79  lung]
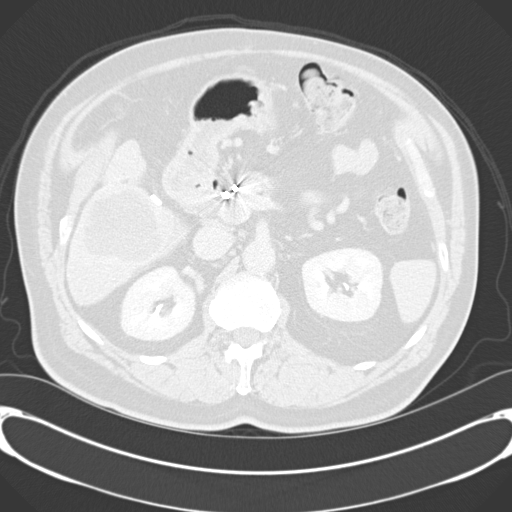
[im 45/79  soft-tissue]
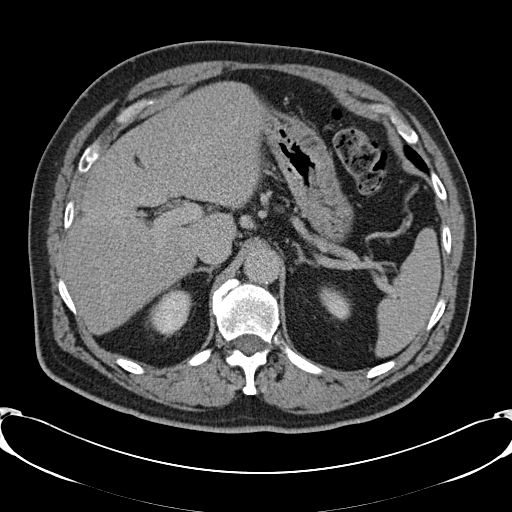
[im 45/79  lung]
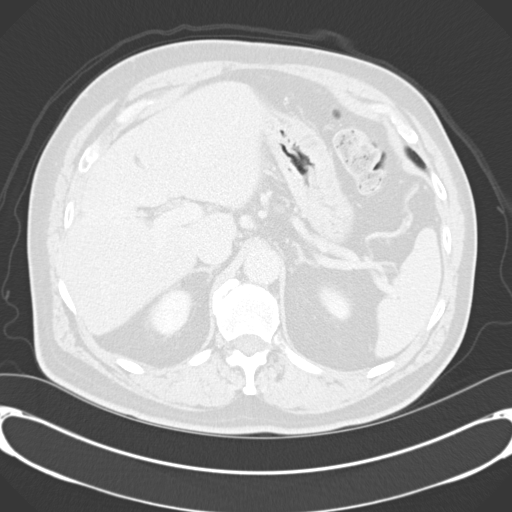
[im 56/79  soft-tissue]
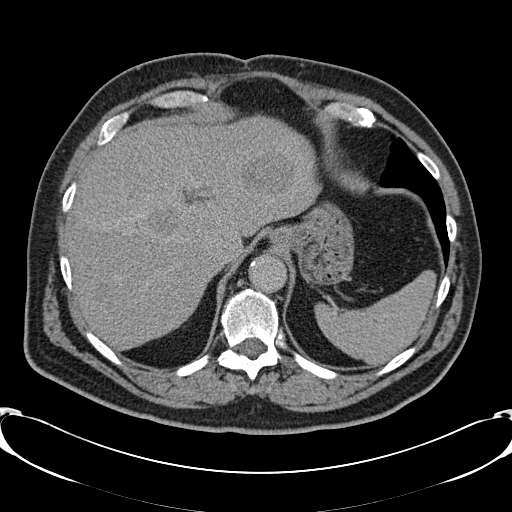
[im 56/79  lung]
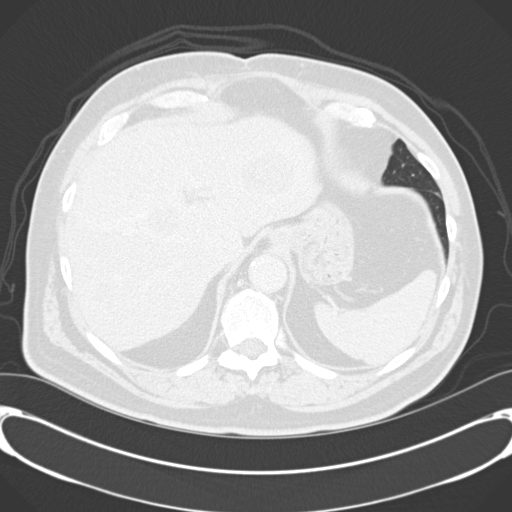
[im 67/79  soft-tissue]
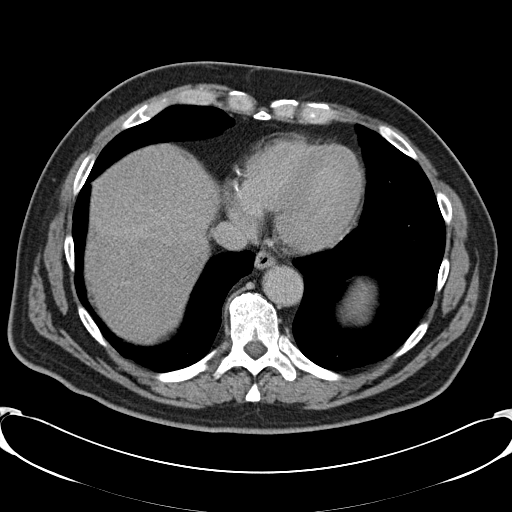
[im 67/79  lung]
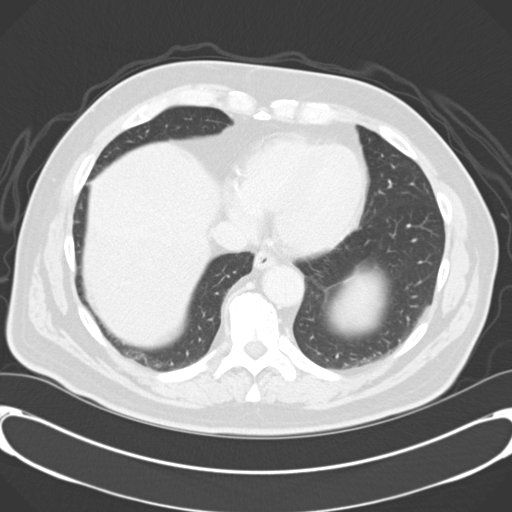

[Series 602: <mpr thick range> · coronal · 0.74mm/px · 2 of 91 slices shown, 3 images]
[im 31/91  soft-tissue]
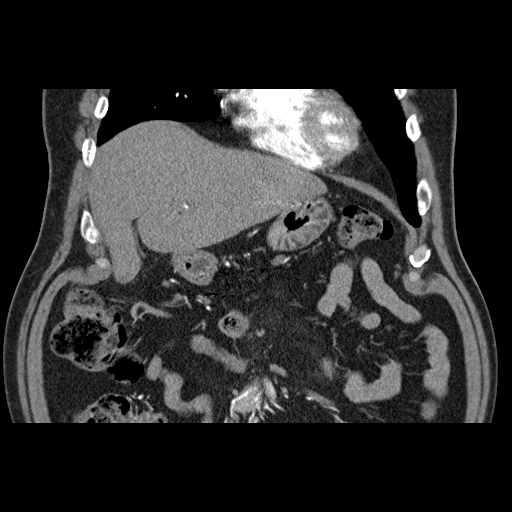
[im 31/91  bone]
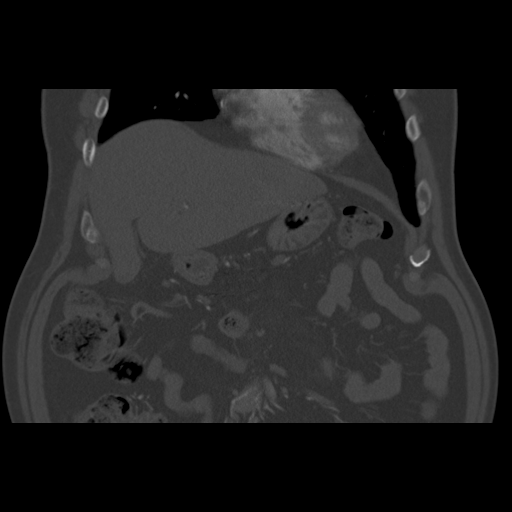
[im 61/91  soft-tissue]
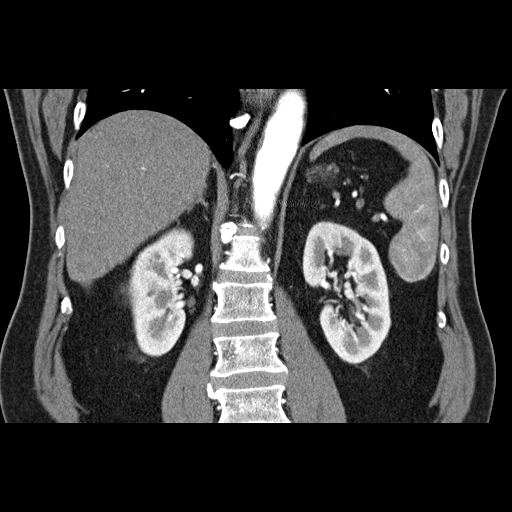

[12 of 46 positions shown; findings below may reference images not displayed]

FINDINGS: Lung Bases: Unremarkable.

Abdomen: When compared to the prior examination, the majority of the
previously noted liver lesions appears slightly larger, and there is
at least 1 new liver lesion, concerning for progression of disease.
The one exception to this pattern is slight decreased size of a 1 x
1.5 cm lesion near the dome in segment 8 (image 11 of series 5)
which previously measured 1.4 x 1.3 cm. The new lesion identified is
in segment 1 (image 32 of series 5) measuring 1.3 x 1.6 cm.
Enlarging lesions include the following:

- A 4.3 x 3.3 cm mass in segment 2 of the liver (image 24 of series
5), previously 4.0 x 2.0 cm.

- A 3.4 x 3.2 cm lesion in the central aspect of the inferior aspect
of segment 8 (image 28 of series 5), previously 2.9 x 3.2 cm.

- A 7.8 x 8.5 cm cystic appearing mass with large lateral mural
nodule (image 55 of series 5) in the inferior aspect of segments 5
and 6, previously 7.1 cm.

- A 2.4 x 2.0 cm lesion in segment 7 of the liver (image 22 of
series 5), previously 1.5 x 1.9 cm.

In addition, there is a new area of hypoenhancement in the periphery
of the right lobe of the liver, which is favored to represent a
perfusion anomaly. Benign-appearing 1.2 cm low-attenuation lesion in
segment 8 of the liver is unchanged, presumably a small cyst. No
intrahepatic biliary ductal dilatation. Status post cholecystectomy.
Embolization coils are noted anterior to the head of the pancreas.
The appearance of the pancreas, spleen, bilateral adrenal glands and
the left kidney is unremarkable. Subcentimeter low attenuation
lesions in the right kidney are too small to definitively
characterize, but are similar to the prior study, favored to
represent tiny cysts. As with prior examinations there is a 3.0 x
2.5 cm partially calcified mass in the central small bowel mesentery
(image 79 of series 5), which appears similar to the prior study.
Multiple borderline enlarged adjacent mesenteric lymph nodes are
noted, also similar to the prior examination. Within the visualized
portions of the peritoneal cavity there is no significant volume of
ascites. Suture line is noted associated with the small bowel in the
right upper quadrant of the abdomen.

Musculoskeletal: There are no aggressive appearing lytic or blastic
lesions noted in the visualized portions of the skeleton.
IMPRESSION: 1. Today's study demonstrates progression of disease, with
enlargement of the majority of the previously noted hepatic lesions,
and a new hepatic lesion in segment 1 of the liver, as detailed
above.
2. Additional incidental findings, similar to prior studies, as
above.

## 2014-02-21 MED ORDER — IOHEXOL 300 MG/ML  SOLN: 100.0000 mL | Freq: Once | INTRAMUSCULAR | Status: DC | PRN

## 2014-02-21 MED ORDER — IOHEXOL 350 MG/ML SOLN
100.0000 mL | Freq: Once | INTRAVENOUS | Status: AC | PRN
  Administered 2014-02-21: 100 mL via INTRAVENOUS

## 2014-02-21 NOTE — Telephone Encounter (Signed)
Error

## 2014-02-22 ENCOUNTER — Ambulatory Visit (HOSPITAL_BASED_OUTPATIENT_CLINIC_OR_DEPARTMENT_OTHER): Payer: Medicare PPO

## 2014-02-22 ENCOUNTER — Encounter: Payer: Self-pay | Admitting: Hematology & Oncology

## 2014-02-22 ENCOUNTER — Ambulatory Visit (HOSPITAL_BASED_OUTPATIENT_CLINIC_OR_DEPARTMENT_OTHER): Payer: Medicare PPO | Admitting: Hematology & Oncology

## 2014-02-22 ENCOUNTER — Other Ambulatory Visit: Payer: Medicare PPO | Admitting: Lab

## 2014-02-22 VITALS — BP 134/75 | HR 74 | Temp 98.3°F | Resp 18 | Ht 70.0 in | Wt 215.0 lb

## 2014-02-22 DIAGNOSIS — C787 Secondary malignant neoplasm of liver and intrahepatic bile duct: Secondary | ICD-10-CM

## 2014-02-22 DIAGNOSIS — E340 Carcinoid syndrome: Secondary | ICD-10-CM

## 2014-02-22 DIAGNOSIS — C7A00 Malignant carcinoid tumor of unspecified site: Secondary | ICD-10-CM

## 2014-02-22 DIAGNOSIS — D499 Neoplasm of unspecified behavior of unspecified site: Secondary | ICD-10-CM

## 2014-02-22 MED ORDER — OCTREOTIDE ACETATE 30 MG IM KIT
PACK | INTRAMUSCULAR | Status: AC
  Filled 2014-02-22: qty 1

## 2014-02-22 MED ORDER — OCTREOTIDE ACETATE 30 MG IM KIT
30.0000 mg | PACK | Freq: Once | INTRAMUSCULAR | Status: AC
  Administered 2014-02-22: 30 mg via INTRAMUSCULAR

## 2014-02-22 NOTE — Patient Instructions (Signed)

## 2014-02-23 NOTE — Progress Notes (Signed)
Hematology and Oncology Follow Up Visit  Brian Mays [id number] 1946-10-17 68 y.o. 02/23/2014   Principle Diagnosis:  Metastatic low grade neuroendocrine tumor-hepatic metastases  Current Therapy:    #1 patient status post second yttrium-90 intrahepatic therapy  #2 Sandostatin LAR 30 mg IM every month     Interim History:  [name] is back for followup. Is to be well.  The interventional radiologist are planning to attempt another intrahepatic yttrium-90 procedure. This I think will be in late May.  We did go ahead and do a repeat scan on him. It does show that he has increase in hepatic metastasis. There are maybe a new one. There is no disease outside the liver.  His last chromogranin A was 22.  He's had no abdominal pain. His appetite been good. He's had no nausea vomiting. He's had no flushing. He's had no wheezing. There's been no diarrhea.  He still working. He still has an excellent performance status-ECoG 0 Medications: Current outpatient prescriptions:amLODipine (NORVASC) 5 MG tablet, Take 5 mg by mouth every morning. , Disp: , Rfl: ;  aspirin EC 81 MG tablet, Take 81 mg by mouth every morning., Disp: , Rfl: ;  Cholecalciferol 1000 UNITS tablet, Take 1,000 Units by mouth daily.  , Disp: , Rfl: ;  hydrocortisone (ANUSOL-HC) 25 MG suppository, INSERT 1 SUPPOSITORY RECTALLY TWICE A DAY AS NEEDED FOR HEMORRHOIDS, Disp: 30 suppository, Rfl: 0 lansoprazole (PREVACID) 30 MG capsule, Take 30 mg by mouth daily at 12 noon., Disp: , Rfl: ;  levothyroxine (SYNTHROID, LEVOTHROID) 50 MCG tablet, Take 50 mcg by mouth daily before breakfast., Disp: , Rfl: ;  lipase/protease/amylase (CREON-12/PANCREASE) 12000 UNITS CPEP capsule, Take 1 capsule by mouth 3 (three) times daily before meals., Disp: , Rfl: ;  losartan (COZAAR) 100 MG tablet, Take 100 mg by mouth every morning., Disp: , Rfl:  Octreotide Acetate (SANDOSTATIN IJ), Inject 1 application as directed every 28 (twenty-eight) days. , Disp:  , Rfl: ;  PARoxetine (PAXIL) 10 MG tablet, Take 10 mg by mouth every morning., Disp: , Rfl: ;  vitamin B-12 (CYANOCOBALAMIN) 500 MCG tablet, Take 500 mcg by mouth 2 (two) times daily., Disp: , Rfl:   Allergies:  Allergies  Allergen Reactions  . Iohexol      Code: HIVES, Desc: PER [name] @ [facility], PT IS ALLERGIC TO CONTRAST DYE 10/02/08/RM  05/01/10...needs full premeds per our protocol w/ [facility]., Onset Date: [id number]     Past Medical History, Surgical history, Social history, and Family History were reviewed and updated.  Review of Systems: As above  Physical Exam:  height is 5\' 10"  (1.778 m) and weight is 215 lb (97.523 kg). His oral temperature is 98.3 F (36.8 C). His blood pressure is 134/75 and his pulse is 74. His respiration is 18.   Well-developed and well-nourished gentleman. His head and neck exam shows no ocular or oral lesion. There is no palpable cervical or supraclavicular lymph nodes. Lungs are clear. No wheezes are noted. Cardiac exam regular in rhythm. Abdomen is soft. He has good bowel sounds. There is no fluid wave. There is no palpable liver or spleen. Back exam no tenderness over the spine ribs or hips. Extremities shows no clubbing cyanosis or edema. Skin exam no rashes. Neurological exam shows no focal neurological deficits.  Lab Results  Component Value Date   WBC 6.5 01/25/2014   HGB 14.2 01/25/2014   HCT 43.1 01/25/2014   MCV 88 01/25/2014   PLT 159 01/25/2014  Chemistry      Component Value Date/Time   NA 140 01/25/2014 0823   NA 140 12/12/2013 0745   K 3.6 01/25/2014 0823   K 4.1 12/12/2013 0745   CL 96* 01/25/2014 0823   CL 101 12/12/2013 0745   CO2 29 01/25/2014 0823   CO2 29 12/12/2013 0745   BUN 13 01/25/2014 0823   BUN 14 12/12/2013 0745   CREATININE 0.9 01/25/2014 0823   CREATININE 0.79 12/12/2013 0745      Component Value Date/Time   CALCIUM 9.1 01/25/2014 0823   CALCIUM 9.3 12/12/2013 0745   ALKPHOS 64 01/25/2014 0823   ALKPHOS 68 12/12/2013  0745   AST 34 01/25/2014 0823   AST 33 12/12/2013 0745   ALT 21 01/25/2014 0823   ALT 34 12/12/2013 0745   BILITOT 0.70 01/25/2014 0823   BILITOT 0.6 12/12/2013 0745         Impression and Plan: Brian Mays is a 68 year old gentleman with long-standing metastatic neuroendocrine carcinoma. This is a low-grade. It is a little more active. Since his disease is active within the liver, we wanted to proceed with intrahepatic therapy.  For right now, but we will see what radiology can do. It is possible that she may be reaching the max radiation that his liver can tolerate.  We will certainly entertain systemic therapy if necessary. We now have newly approved oral therapies which do have success. We do have to be careful with toxicity however.  We will continue with a Sandostatin.  I went over the scans with him. I explained what I thought we needed to do if intrahepatic therapy is not effective.  We'll plan to get him back in another 3-4 weeks.  I'll have radiology look at his scans to see if this next yttrium-90 therapy would help.   Volanda Napoleon, MD 4/25/20157:20 AM

## 2014-02-26 ENCOUNTER — Telehealth: Payer: Self-pay | Admitting: Hematology & Oncology

## 2014-02-26 NOTE — Telephone Encounter (Signed)
[place]: [id number] Status: Approved Dates: 02/22/2014-05/23/2014 CPT: B5597

## 2014-03-04 ENCOUNTER — Other Ambulatory Visit: Payer: Self-pay | Admitting: Hematology & Oncology

## 2014-03-05 ENCOUNTER — Other Ambulatory Visit (HOSPITAL_COMMUNITY): Payer: Medicare PPO

## 2014-03-05 ENCOUNTER — Encounter (HOSPITAL_COMMUNITY): Payer: Medicare PPO

## 2014-03-18 ENCOUNTER — Telehealth: Payer: Self-pay | Admitting: Hematology & Oncology

## 2014-03-18 ENCOUNTER — Other Ambulatory Visit: Payer: Self-pay | Admitting: *Deleted

## 2014-03-18 DIAGNOSIS — D499 Neoplasm of unspecified behavior of unspecified site: Secondary | ICD-10-CM

## 2014-03-18 NOTE — Telephone Encounter (Signed)
Tiffany from IR called pt needs billirubin before 5-20 at 3pm. Pt is aware of 5-19 lab at [facility]. RN aware to order lab

## 2014-03-19 ENCOUNTER — Other Ambulatory Visit (HOSPITAL_BASED_OUTPATIENT_CLINIC_OR_DEPARTMENT_OTHER): Payer: Medicare PPO

## 2014-03-19 ENCOUNTER — Encounter (HOSPITAL_COMMUNITY): Payer: Self-pay | Admitting: Pharmacy Technician

## 2014-03-19 DIAGNOSIS — D499 Neoplasm of unspecified behavior of unspecified site: Secondary | ICD-10-CM

## 2014-03-19 DIAGNOSIS — C787 Secondary malignant neoplasm of liver and intrahepatic bile duct: Secondary | ICD-10-CM

## 2014-03-19 DIAGNOSIS — C7A00 Malignant carcinoid tumor of unspecified site: Secondary | ICD-10-CM

## 2014-03-19 LAB — COMPREHENSIVE METABOLIC PANEL (CC13)
ALBUMIN: 3.3 g/dL — AB (ref 3.5–5.0)
ALT: 24 U/L (ref 0–55)
AST: 30 U/L (ref 5–34)
Alkaline Phosphatase: 71 U/L (ref 40–150)
Anion Gap: 10 mEq/L (ref 3–11)
BUN: 13.8 mg/dL (ref 7.0–26.0)
CALCIUM: 9.4 mg/dL (ref 8.4–10.4)
CHLORIDE: 106 meq/L (ref 98–109)
CO2: 25 meq/L (ref 22–29)
Creatinine: 0.9 mg/dL (ref 0.7–1.3)
Glucose: 125 mg/dl (ref 70–140)
Potassium: 3.9 mEq/L (ref 3.5–5.1)
Sodium: 141 mEq/L (ref 136–145)
Total Bilirubin: 0.69 mg/dL (ref 0.20–1.20)
Total Protein: 7.2 g/dL (ref 6.4–8.3)

## 2014-03-20 ENCOUNTER — Other Ambulatory Visit: Payer: Self-pay | Admitting: Radiology

## 2014-03-21 ENCOUNTER — Encounter (HOSPITAL_COMMUNITY): Payer: Self-pay | Admitting: Pharmacy Technician

## 2014-03-22 ENCOUNTER — Encounter: Payer: Self-pay | Admitting: Hematology & Oncology

## 2014-03-22 ENCOUNTER — Other Ambulatory Visit: Payer: Medicare PPO | Admitting: Lab

## 2014-03-22 ENCOUNTER — Other Ambulatory Visit: Payer: Self-pay | Admitting: Radiology

## 2014-03-22 ENCOUNTER — Ambulatory Visit (HOSPITAL_BASED_OUTPATIENT_CLINIC_OR_DEPARTMENT_OTHER): Payer: Medicare PPO

## 2014-03-22 ENCOUNTER — Other Ambulatory Visit: Payer: Self-pay | Admitting: Internal Medicine

## 2014-03-22 ENCOUNTER — Ambulatory Visit (HOSPITAL_BASED_OUTPATIENT_CLINIC_OR_DEPARTMENT_OTHER): Payer: Medicare PPO | Admitting: Hematology & Oncology

## 2014-03-22 VITALS — BP 136/76 | HR 72 | Temp 97.6°F | Resp 18 | Ht 70.0 in | Wt 212.0 lb

## 2014-03-22 DIAGNOSIS — D499 Neoplasm of unspecified behavior of unspecified site: Secondary | ICD-10-CM

## 2014-03-22 DIAGNOSIS — C7A00 Malignant carcinoid tumor of unspecified site: Secondary | ICD-10-CM

## 2014-03-22 DIAGNOSIS — I10 Essential (primary) hypertension: Secondary | ICD-10-CM

## 2014-03-22 DIAGNOSIS — E340 Carcinoid syndrome: Secondary | ICD-10-CM

## 2014-03-22 DIAGNOSIS — C787 Secondary malignant neoplasm of liver and intrahepatic bile duct: Secondary | ICD-10-CM

## 2014-03-22 MED ORDER — OCTREOTIDE ACETATE 30 MG IM KIT
PACK | INTRAMUSCULAR | Status: AC
  Filled 2014-03-22: qty 1

## 2014-03-22 MED ORDER — OCTREOTIDE ACETATE 30 MG IM KIT
30.0000 mg | PACK | Freq: Once | INTRAMUSCULAR | Status: AC
  Administered 2014-03-22: 30 mg via INTRAMUSCULAR

## 2014-03-22 NOTE — Progress Notes (Signed)
Hematology and Oncology Follow Up Visit  Brian Mays [id number] 26-Dec-1945 68 y.o. 03/22/2014   Principle Diagnosis:  Metastatic low grade neuroendocrine tumor-hepatic metastases  Current Therapy:    #1 patient status post second yttrium-90 intrahepatic therapy  #2 Sandostatin LAR 30 mg IM every month     Interim History:  Brian Mays is back for followup. Is to be well.  The interventional radiologist are still planning to attempt another intrahepatic yttrium-90 procedure. This has this scheduled for May 26. Although, this will be able to be done. If not, then the palliative consider systemic chemotherapy.[name]  His last chromogranin A was 20.  He's had no abdominal pain. His appetite been good. He's had no nausea vomiting. He's had no flushing. He's had no wheezing. There's been no diarrhea. He has been working. There's been no problems with abdominal pain. He's had no leg swelling. There's been no rash. Head no headache.  . He still has an excellent performance status-ECoG 0 Medications: Current outpatient prescriptions:amLODipine (NORVASC) 5 MG tablet, Take 5 mg by mouth every morning. , Disp: , Rfl: ;  aspirin EC 81 MG tablet, Take 81 mg by mouth every morning., Disp: , Rfl: ;  Cholecalciferol 1000 UNITS tablet, Take 1,000 Units by mouth daily.  , Disp: , Rfl: ;  hydrocortisone (ANUSOL-HC) 25 MG suppository, Place 25 mg rectally 2 (two) times daily as needed for hemorrhoids or itching., Disp: , Rfl:  lansoprazole (PREVACID) 30 MG capsule, Take 30 mg by mouth daily at 12 noon., Disp: , Rfl: ;  levothyroxine (SYNTHROID, LEVOTHROID) 50 MCG tablet, Take 50 mcg by mouth daily before breakfast., Disp: , Rfl: ;  lipase/protease/amylase (CREON-12/PANCREASE) 12000 UNITS CPEP capsule, Take 1 capsule by mouth 3 (three) times daily before meals., Disp: , Rfl: ;  losartan (COZAAR) 100 MG tablet, Take 100 mg by mouth every morning., Disp: , Rfl:  Octreotide Acetate (SANDOSTATIN IJ), Inject 1 application  as directed every 28 (twenty-eight) days. Pt is followed by Dr. Marin Olp office, Disp: , Rfl: ;  PARoxetine (PAXIL) 10 MG tablet, Take 10 mg by mouth every morning., Disp: , Rfl: ;  sodium chloride (OCEAN) 0.65 % SOLN nasal spray, Place 1 spray into both nostrils as needed for congestion. , Disp: , Rfl:  vitamin B-12 (CYANOCOBALAMIN) 500 MCG tablet, Take 500 mcg by mouth 2 (two) times daily., Disp: , Rfl:   Allergies:  Allergies  Allergen Reactions  . Iohexol      Code: HIVES, Desc: PER [name] @ [facility], PT IS ALLERGIC TO CONTRAST DYE 10/02/08/RM  05/01/10...needs full premeds per our protocol w/ [facility]., Onset Date: [id number]     Past Medical History, Surgical history, Social history, and Family History were reviewed and updated.  Review of Systems: As above  Physical Exam:  height is 5\' 10"  (1.778 m) and weight is 212 lb (96.163 kg). His oral temperature is 97.6 F (36.4 C). His blood pressure is 136/76 and his pulse is 72. His respiration is 18.   Well-developed and well-nourished gentleman. His head and neck exam shows no ocular or oral lesion. There is no palpable cervical or supraclavicular lymph nodes. Lungs are clear. No wheezes are noted. Cardiac exam regular in rhythm. Abdomen is soft. He has good bowel sounds. There is no fluid wave. There is no palpable liver or spleen. Back exam no tenderness over the spine ribs or hips. Extremities shows no clubbing cyanosis or edema. Skin exam no rashes. Neurological exam shows no focal neurological  deficits.  Lab Results  Component Value Date   WBC 6.5 01/25/2014   HGB 14.2 01/25/2014   HCT 43.1 01/25/2014   MCV 88 01/25/2014   PLT 159 01/25/2014     Chemistry      Component Value Date/Time   NA 141 03/19/2014 1136   NA 140 01/25/2014 0823   NA 140 12/12/2013 0745   K 3.9 03/19/2014 1136   K 3.6 01/25/2014 0823   K 4.1 12/12/2013 0745   CL 96* 01/25/2014 0823   CL 101 12/12/2013 0745   CO2 25 03/19/2014 1136   CO2 29 01/25/2014 0823    CO2 29 12/12/2013 0745   BUN 13.8 03/19/2014 1136   BUN 13 01/25/2014 0823   BUN 14 12/12/2013 0745   CREATININE 0.9 03/19/2014 1136   CREATININE 0.9 01/25/2014 0823   CREATININE 0.79 12/12/2013 0745      Component Value Date/Time   CALCIUM 9.4 03/19/2014 1136   CALCIUM 9.1 01/25/2014 0823   CALCIUM 9.3 12/12/2013 0745   ALKPHOS 71 03/19/2014 1136   ALKPHOS 64 01/25/2014 0823   ALKPHOS 68 12/12/2013 0745   AST 30 03/19/2014 1136   AST 34 01/25/2014 0823   AST 33 12/12/2013 0745   ALT 24 03/19/2014 1136   ALT 21 01/25/2014 0823   ALT 34 12/12/2013 0745   BILITOT 0.69 03/19/2014 1136   BILITOT 0.70 01/25/2014 0823   BILITOT 0.6 12/12/2013 0745         Impression Plan: [name] is a 68 year old gentleman with long-standing metastatic neuroendocrine carcinoma. This is a low-grade. It is a little more active. Since his disease is active within the liver, we wanted to proceed with intrahepatic therapy.  For right now, but we will see what radiology can do. Again, hopefully they will be able to do the left side of his liver. We will certainly entertain systemic therapy if necessary. We now have newly approved oral therapies which do have success. We do have to be careful with toxicity however.  We will continue with a Sandostatin. Believe  We'll plan to get him back in another 3-4 weeks.    Volanda Napoleon, MD 5/22/201510:45 AM

## 2014-03-22 NOTE — Patient Instructions (Signed)

## 2014-03-26 ENCOUNTER — Encounter (HOSPITAL_COMMUNITY)
Admission: RE | Admit: 2014-03-26 | Discharge: 2014-03-26 | Disposition: A | Discharge status: Home / Self Care | Payer: Medicare PPO | Source: Ambulatory Visit | Attending: Interventional Radiology | Admitting: Interventional Radiology

## 2014-03-26 ENCOUNTER — Encounter (HOSPITAL_COMMUNITY): Payer: Self-pay

## 2014-03-26 ENCOUNTER — Ambulatory Visit (HOSPITAL_COMMUNITY)
Admission: RE | Admit: 2014-03-26 | Discharge: 2014-03-26 | Disposition: A | Discharge status: Home / Self Care | Payer: Medicare PPO | Source: Ambulatory Visit | Attending: Interventional Radiology | Admitting: Interventional Radiology

## 2014-03-26 ENCOUNTER — Other Ambulatory Visit: Payer: Self-pay | Admitting: Interventional Radiology

## 2014-03-26 DIAGNOSIS — K219 Gastro-esophageal reflux disease without esophagitis: Secondary | ICD-10-CM | POA: Insufficient documentation

## 2014-03-26 DIAGNOSIS — C7A8 Other malignant neuroendocrine tumors: Secondary | ICD-10-CM

## 2014-03-26 DIAGNOSIS — C7B8 Other secondary neuroendocrine tumors: Principal | ICD-10-CM

## 2014-03-26 DIAGNOSIS — Z860100 Personal history of colon polyps, unspecified: Secondary | ICD-10-CM | POA: Insufficient documentation

## 2014-03-26 DIAGNOSIS — C7A00 Malignant carcinoid tumor of unspecified site: Secondary | ICD-10-CM | POA: Insufficient documentation

## 2014-03-26 DIAGNOSIS — Z79899 Other long term (current) drug therapy: Secondary | ICD-10-CM | POA: Insufficient documentation

## 2014-03-26 DIAGNOSIS — E039 Hypothyroidism, unspecified: Secondary | ICD-10-CM | POA: Insufficient documentation

## 2014-03-26 DIAGNOSIS — Z87891 Personal history of nicotine dependence: Secondary | ICD-10-CM | POA: Insufficient documentation

## 2014-03-26 DIAGNOSIS — C787 Secondary malignant neoplasm of liver and intrahepatic bile duct: Secondary | ICD-10-CM | POA: Insufficient documentation

## 2014-03-26 DIAGNOSIS — I10 Essential (primary) hypertension: Secondary | ICD-10-CM | POA: Insufficient documentation

## 2014-03-26 DIAGNOSIS — Z7982 Long term (current) use of aspirin: Secondary | ICD-10-CM | POA: Insufficient documentation

## 2014-03-26 LAB — COMPREHENSIVE METABOLIC PANEL
ALT: 19 U/L (ref 0–53)
AST: 29 U/L (ref 0–37)
Albumin: 3.4 g/dL — ABNORMAL LOW (ref 3.5–5.2)
Alkaline Phosphatase: 76 U/L (ref 39–117)
BILIRUBIN TOTAL: 0.5 mg/dL (ref 0.3–1.2)
BUN: 17 mg/dL (ref 6–23)
CHLORIDE: 103 meq/L (ref 96–112)
CO2: 25 mEq/L (ref 19–32)
Calcium: 9.3 mg/dL (ref 8.4–10.5)
Creatinine, Ser: 0.7 mg/dL (ref 0.50–1.35)
Glucose, Bld: 141 mg/dL — ABNORMAL HIGH (ref 70–99)
Potassium: 4.4 mEq/L (ref 3.7–5.3)
Sodium: 141 mEq/L (ref 137–147)
Total Protein: 7.7 g/dL (ref 6.0–8.3)

## 2014-03-26 LAB — CBC WITH DIFFERENTIAL/PLATELET
BASOS ABS: 0 10*3/uL (ref 0.0–0.1)
Basophils Relative: 0 % (ref 0–1)
Eosinophils Absolute: 0 10*3/uL (ref 0.0–0.7)
Eosinophils Relative: 0 % (ref 0–5)
HEMATOCRIT: 40.8 % (ref 39.0–52.0)
HEMOGLOBIN: 13.4 g/dL (ref 13.0–17.0)
LYMPHS ABS: 0.6 10*3/uL — AB (ref 0.7–4.0)
LYMPHS PCT: 7 % — AB (ref 12–46)
MCH: 28.5 pg (ref 26.0–34.0)
MCHC: 32.8 g/dL (ref 30.0–36.0)
MCV: 86.6 fL (ref 78.0–100.0)
MONO ABS: 0.1 10*3/uL (ref 0.1–1.0)
MONOS PCT: 1 % — AB (ref 3–12)
NEUTROS ABS: 8.1 10*3/uL — AB (ref 1.7–7.7)
Neutrophils Relative %: 92 % — ABNORMAL HIGH (ref 43–77)
Platelets: 177 10*3/uL (ref 150–400)
RBC: 4.71 MIL/uL (ref 4.22–5.81)
RDW: 13.6 % (ref 11.5–15.5)
WBC: 8.8 10*3/uL (ref 4.0–10.5)

## 2014-03-26 LAB — APTT: aPTT: 28 seconds (ref 24–37)

## 2014-03-26 LAB — PROTIME-INR
INR: 1.03 (ref 0.00–1.49)
Prothrombin Time: 13.3 seconds (ref 11.6–15.2)

## 2014-03-26 IMAGING — XA IR EMBO TUMOR ORGAN ISCHEMIA INFARCT INC GUIDE ROADMAPPING
12 series · 13 of 24 positions shown · non-contrast
Comparison: none

CLINICAL DATA: History of metastatic neuroendocrine carcinoma to
the liver and status post prior right hepatic and middle hepatic
arterial [71] radioembolization. There is some progression of
metastatic disease by imaging, especially in the left lobe of the
liver. There was inadvertent catheter dissection of the left hepatic
artery during the most recent treatment on [DATE]. The patient
now presents for treatment of the left hepatic artery.

[Series 1: body 4 · 1 of 39 frames shown (1 of 3)]
[frame 6/39]
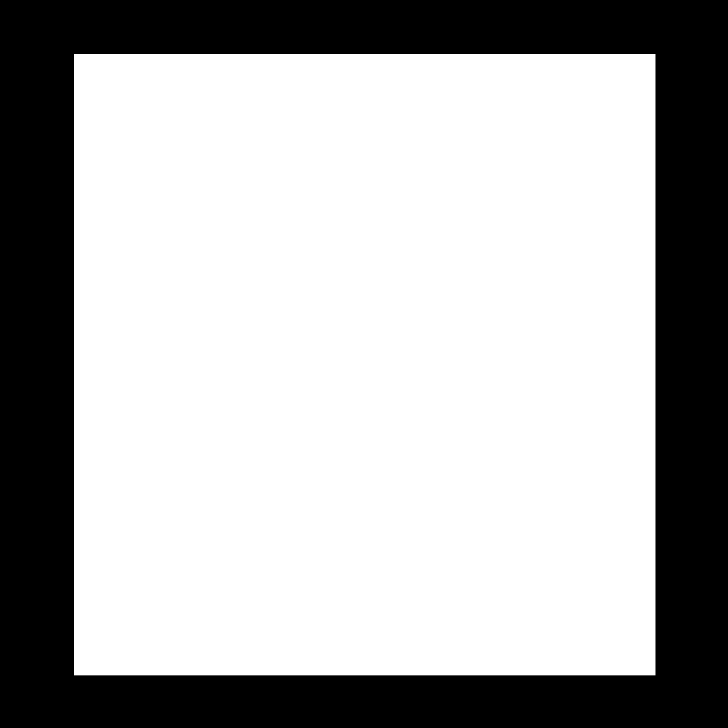

[Series 1: ir embo tumor organ ischemia infarct inc guide roa · 1 of 1 slices shown]
[im 1/1]
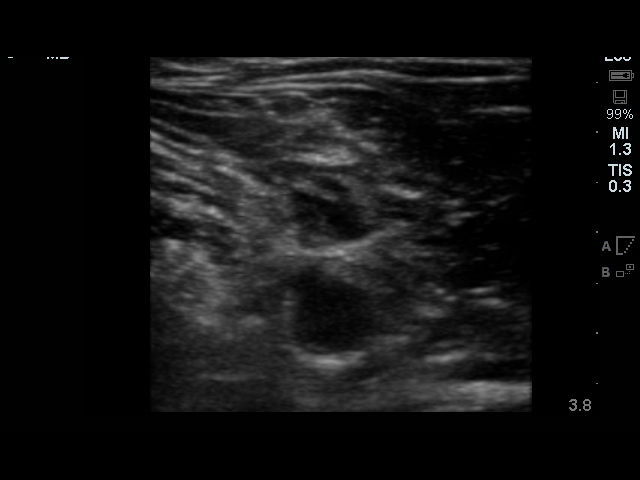

[Series 3: rm - angio · 1 of 236 frames shown]
[frame 119/236]
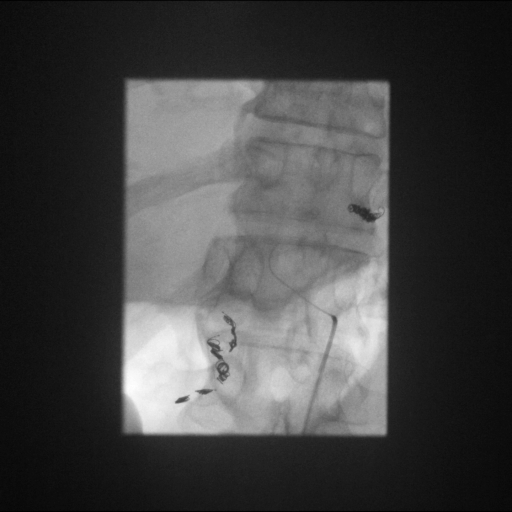

[Series 4: fl - angio · 1 of 21 frames shown (1 of 6)]
[frame 14/21]
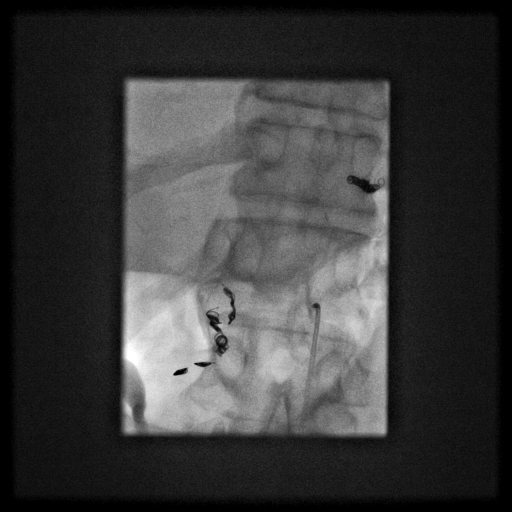

[Series 5: body 4 · 1 of 21 frames shown (2 of 3)]
[frame 11/21]
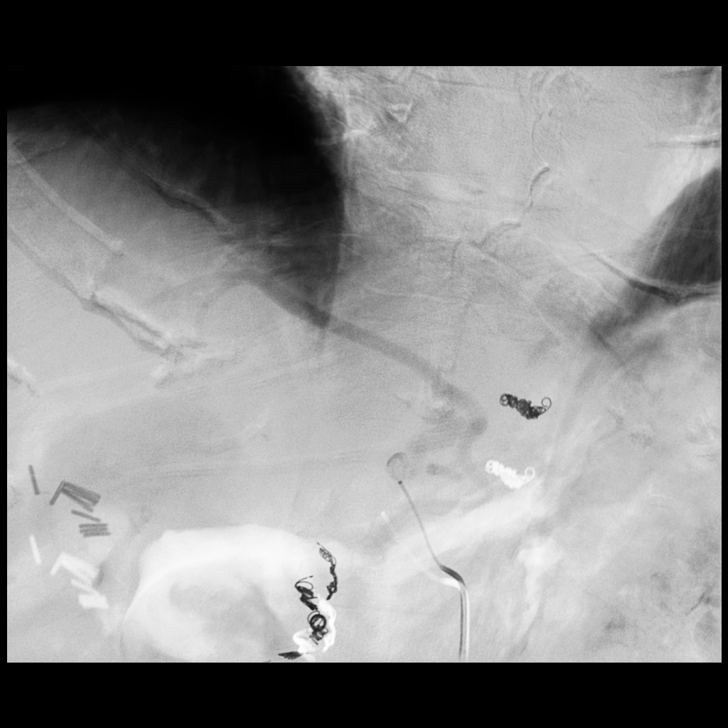

[Series 6: body 4 · 1 of 32 frames shown (3 of 3)]
[frame 17/32]
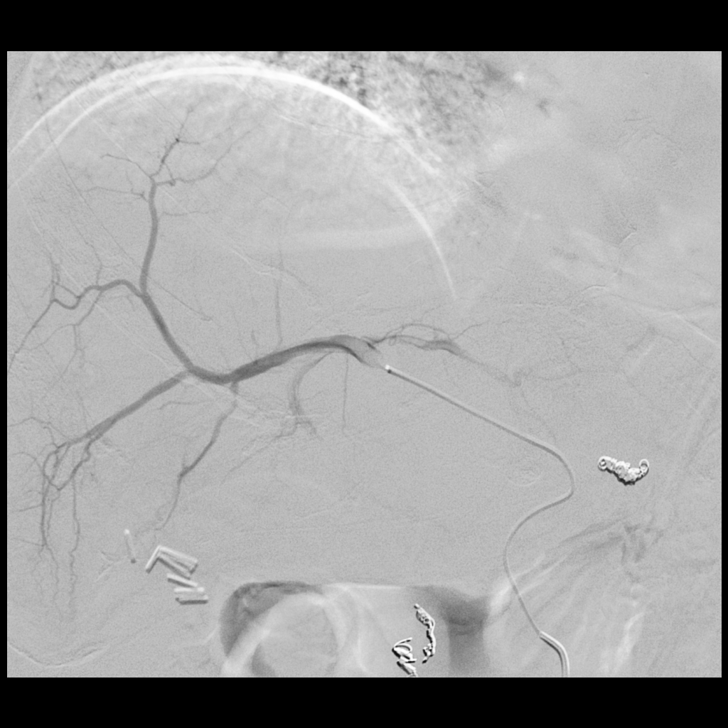

[Series 7: fl - angio · 2 of 138 frames shown (2 of 6)]
[frame 21/138]
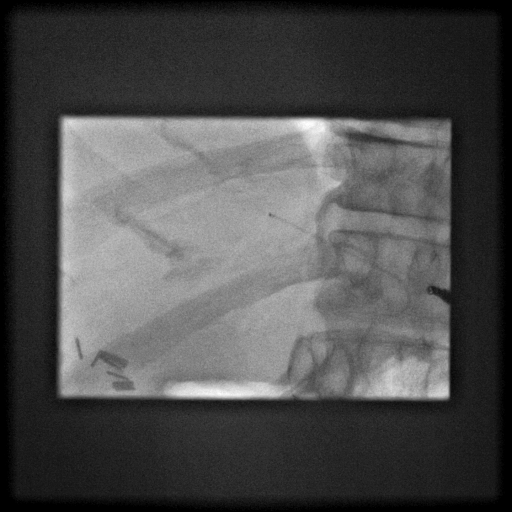
[frame 118/138]
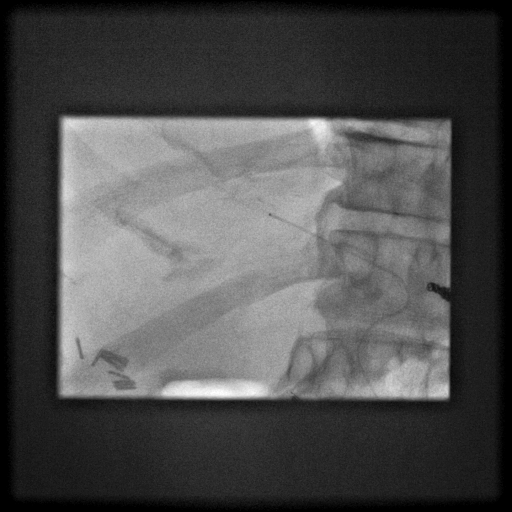

[Series 8: fl - angio · 1 of 296 frames shown (3 of 6)]
[frame 252/296]
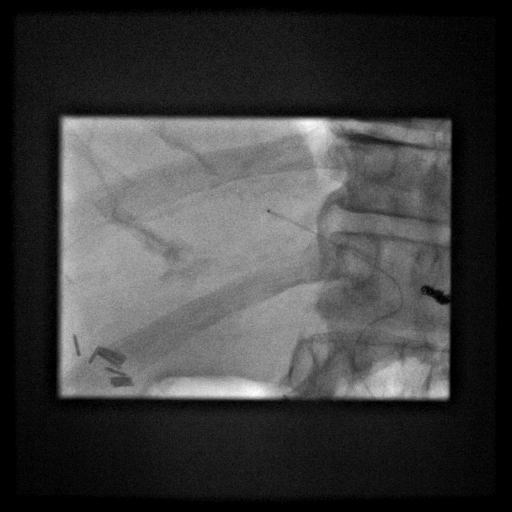

[Series 9: fl - angio · 1 of 261 frames shown (4 of 6)]
[frame 131/261]
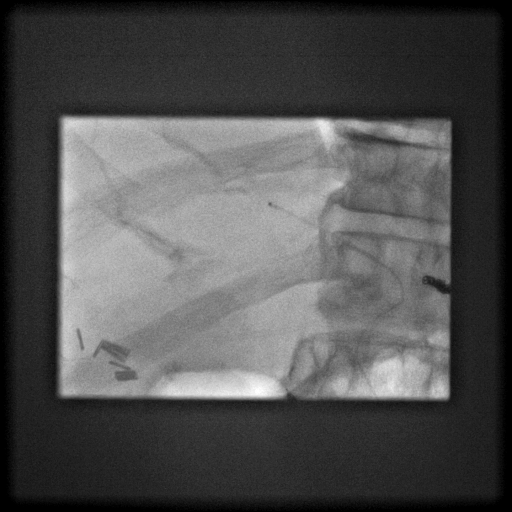

[Series 10: fl - angio · 1 of 395 frames shown (5 of 6)]
[frame 198/395]
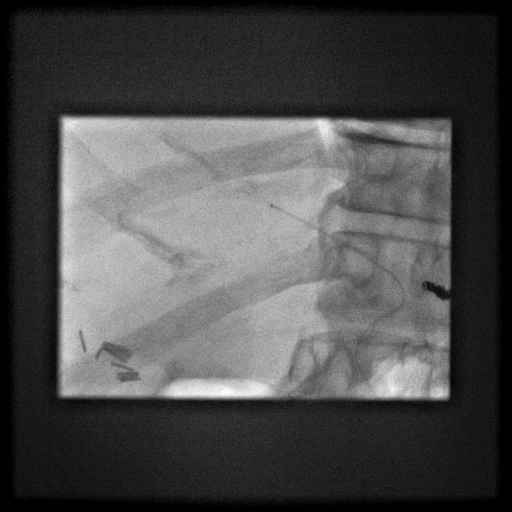

[Series 11: fl - angio · 1 of 27 frames shown (6 of 6)]
[frame 5/27]
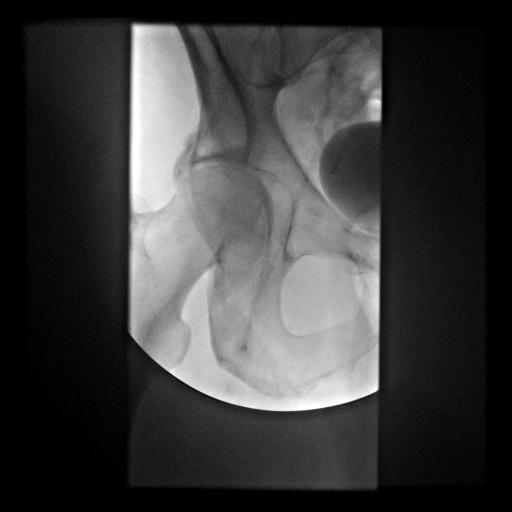

[Series 300: dsa body · 1 of 1 slices shown]
[im 1/1]
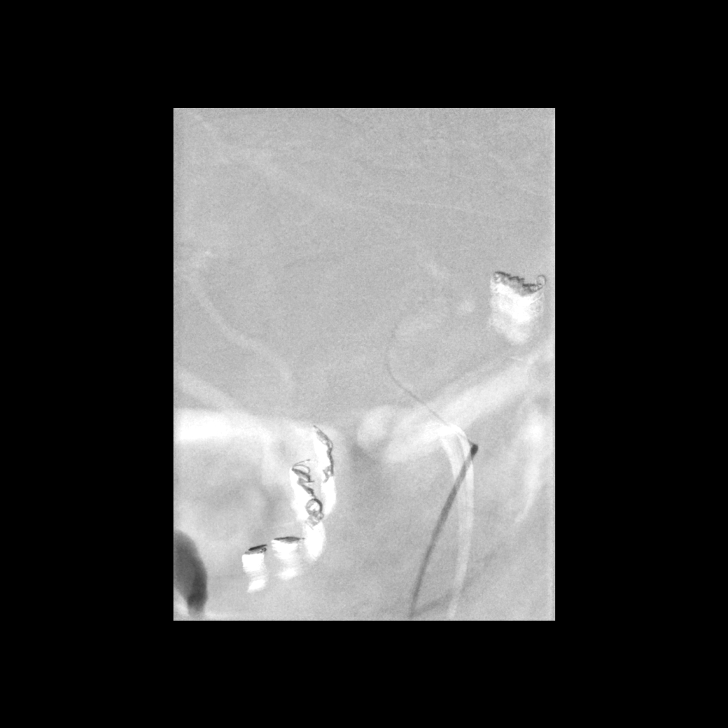

[13 of 24 positions shown; findings below may reference images not displayed]

EXAM:
1. ULTRASOUND GUIDANCE FOR VASCULAR ACCESS OF THE RIGHT COMMON
FEMORAL ARTERY
2. HEPATIC ARTERIOGRAPHY WITH SELECTIVE CATHETERIZATION OF THE
CELIAC AXIS AND ARTERIOGRAPHY AT THE LEVEL OF THE CELIAC AXIS
3. SELECTIVE ARTERIOGRAPHY OF THE LEFT HEPATIC ARTERY
4. TRANSCATHETER [71] RADIOEMBOLIZATION OF THE LEFT HEPATIC
ARTERY
5. FOLLOW-UP ANGIOGRAPHY AFTER LEFT HEPATIC RADIOEMBOLIZATION

FLUOROSCOPY TIME:  15 min and 36 seconds.

MEDICATIONS AND MEDICAL HISTORY:
3.0 mg IV Versed; 100 mcg IV Fentanyl.

Additional Medications: 3.375 grams IV Zosyn, 20 mg IV Decadron, 40
mg IV Protonix, 4 mg IV Zofran

Y-90 dose: 22 mCi

ANESTHESIA/SEDATION:
Moderate sedation time: 80 minutes

CONTRAST:  60 ml [71]

PROCEDURE:
The procedure, risks, benefits, and alternatives were explained to
the patient. Questions regarding the procedure were encouraged and
answered. The patient understands and consents to the procedure.

The right groin was prepped with Betadine in a sterile fashion, and
a sterile drape was applied covering the operative field. A sterile
gown and sterile gloves were used for the procedure. Local
anesthesia was provided with 1% Lidocaine. Ultrasound image
documentation was performed.

Access of the right common femoral artery was performed under
ultrasound guidance with a micropuncture [DATE]-French sheath was
placed. A 5-French Cobra catheter was advanced and used to
selectively catheterize the celiac axis. Selective celiac
arteriography was performed.

A Renegade micro catheter was advanced into the left hepatic artery.
Selective arteriography was performed of the left hepatic artery.
The micro catheter was then further advanced in the left hepatic
artery. Additional selective arteriography was performed at this
level. Radioembolization was performed with [71] SIR Spheres.
Particles were administered via a microcatheter utilizing a
completely enclosed system. Monitoring of antegrade flow was
performed during administration under fluoroscopy with use of
contrast intermittently. After administration of the first dose of
particles, the microcatheter and outer 5 French catheter were
removed and discarded along with the attached tubing and particle
vial.

Femoral puncture site was assessed with oblique arteriography.
Arteriotomy closure was performed with the Cordis ExoSeal device.
FINDINGS: Initial arteriography shows durable occlusion of the gastroduodenal
artery, accessory duodenal artery and accessory gastric artery after
prior coil embolization. The hepatic arterial trifurcation was again
demonstrated with a dominant left hepatic artery present supplying
branches to both left and right lobes. After selective
catheterization, residual irregularity of the left hepatic artery
was noted at the level of prior dissection. Flow was present and the
segment able to be traversed with a micro catheter.

Successful radioembolization was performed with [71] SIR
spheres administered through the micro catheter. Periodic
fluoroscopic visualization was performed during administration of
particles in diluted contrast to confirm antegrade flow throughout
the procedure.

Arteriotomy closure was initially successful in establishing
hemostasis. The patient will recover for 6 hours after the
procedure.

COMPLICATIONS:
None
IMPRESSION: Hepatic arterial radioembolization performed with [71]
microspheres. The dominant left hepatic artery which also supplies
branches to the right lobe was able to be successfully treated today
after traversal of the previous segment of catheter induced
dissection. This segment did show some residual irregularity but was
open and demonstrated interval healing since the dissection. Initial
clinical follow-up will be performed in 4 weeks.

## 2014-03-26 MED ORDER — IOHEXOL 300 MG/ML  SOLN
INTRAMUSCULAR | Status: AC | PRN
  Administered 2014-03-26: 1 mL

## 2014-03-26 MED ORDER — LIDOCAINE HCL 1 % IJ SOLN
INTRAMUSCULAR | Status: AC
  Filled 2014-03-26: qty 20

## 2014-03-26 MED ORDER — YTTRIUM 90 INJECTION: 20.2000 | INJECTION | Freq: Once | INTRAVENOUS | Status: DC

## 2014-03-26 MED ORDER — FENTANYL CITRATE 0.05 MG/ML IJ SOLN
INTRAMUSCULAR | Status: AC | PRN
  Administered 2014-03-26 (×4): 25 ug via INTRAVENOUS

## 2014-03-26 MED ORDER — MIDAZOLAM HCL 2 MG/2ML IJ SOLN
INTRAMUSCULAR | Status: AC
  Filled 2014-03-26: qty 6

## 2014-03-26 MED ORDER — DEXAMETHASONE SODIUM PHOSPHATE 10 MG/ML IJ SOLN
20.0000 mg | Freq: Once | INTRAMUSCULAR | Status: AC
  Administered 2014-03-26: 20 mg via INTRAVENOUS
  Filled 2014-03-26: qty 2

## 2014-03-26 MED ORDER — SODIUM CHLORIDE 0.9 % IV SOLN
INTRAVENOUS | Status: DC
  Administered 2014-03-26: 08:00:00 via INTRAVENOUS

## 2014-03-26 MED ORDER — PIPERACILLIN-TAZOBACTAM 3.375 G IVPB
3.3750 g | Freq: Once | INTRAVENOUS | Status: AC
  Administered 2014-03-26: 3.375 g via INTRAVENOUS
  Filled 2014-03-26: qty 50

## 2014-03-26 MED ORDER — FENTANYL CITRATE 0.05 MG/ML IJ SOLN
INTRAMUSCULAR | Status: AC
  Filled 2014-03-26: qty 6

## 2014-03-26 MED ORDER — ONDANSETRON HCL 4 MG/2ML IJ SOLN
4.0000 mg | Freq: Once | INTRAMUSCULAR | Status: AC
  Administered 2014-03-26: 4 mg via INTRAVENOUS
  Filled 2014-03-26: qty 2

## 2014-03-26 MED ORDER — MIDAZOLAM HCL 2 MG/2ML IJ SOLN
INTRAMUSCULAR | Status: AC | PRN
  Administered 2014-03-26: 0.5 mg via INTRAVENOUS
  Administered 2014-03-26: 1 mg via INTRAVENOUS
  Administered 2014-03-26 (×3): 0.5 mg via INTRAVENOUS

## 2014-03-26 MED ORDER — SODIUM CHLORIDE 0.9 % IV SOLN: INTRAVENOUS | Status: DC

## 2014-03-26 MED ORDER — PANTOPRAZOLE SODIUM 40 MG IV SOLR
40.0000 mg | Freq: Once | INTRAVENOUS | Status: AC
  Administered 2014-03-26: 40 mg via INTRAVENOUS
  Filled 2014-03-26: qty 40

## 2014-03-26 NOTE — Sedation Documentation (Signed)
5Fr sheath removed from R femoral artery by Dr. Kathlene Cote.  Hemostasis achieved using Exoseal closure device.  R groin level 0, 2+DRP.

## 2014-03-26 NOTE — H&P (Signed)
Patient seen and examined.  Will proceed with left hepatic radioembolization today.

## 2014-03-26 NOTE — Sedation Documentation (Signed)
Gauze/tegaderm bandage applied to R Fem artery puncture site, CDI.  Groin level 0, 2+RDP.

## 2014-03-26 NOTE — H&P (Signed)
Brian Mays is an 68 y.o. male.   Chief Complaint: "I'm here for another liver treatment" HPI: Patient with history of metastatic neuroendocrine carcinoma to liver and prior Y90 radioembolization initially in 2011 to right lateral hepatic lobe. On 12/20/13 pt presented for additional Y90 to left hepatic lobe but treatment was not adequately performed secondary to inadvertent dissection of the left hepatic artery. He presents today for repeat attempt at  Y90 radioembolization of the left hepatic artery due to progression of disease.  Past Medical History  Diagnosis Date  . GERD (gastroesophageal reflux disease)   . Pancreatitis 1998    chronic  . HTN (hypertension)   . Hemorrhoid   . Hypothyroidism   . Colon polyps   . Metastatic carcinoma 2010    Dr Jonette Eva  . Diverticulosis   . Gallstones   . Hiatal hernia     Past Surgical History  Procedure Laterality Date  . Cholecystectomy  1999  . Carcinoil resection  01/2009  . Appendectomy  1964    Family History  Problem Relation Age of Onset  . Kidney disease Mother   . Hyperlipidemia Mother   . Hypertension Mother   . COPD Father   . Ulcerative colitis Daughter    Social History:  reports that he quit smoking about 16 years ago. His smoking use included Cigarettes. He started smoking about 54 years ago. He has a 58.5 pack-year smoking history. He has never used smokeless tobacco. He reports that he drinks alcohol. He reports that he does not use illicit drugs.  Allergies:  Allergies  Allergen Reactions  . Iohexol      Code: HIVES, Desc: PER [name] @ [facility]IMARY [facility]ARE, PT IS ALLERGIC TO CONTRAST DYE 10/02/08/RM  05/01/10...needs full premeds per our protocol w/ [facility]., Onset Date: [id number]     Current outpatient prescriptions:amLODipine (NORVASC) 5 MG tablet, Take 5 mg by mouth every morning. , Disp: , Rfl: ;  aspirin EC 81 MG tablet, Take 81 mg by mouth every morning., Disp: , Rfl: ;  Cholecalciferol 1000 UNITS tablet, Take 1,000  Units by mouth daily.  , Disp: , Rfl: ;  hydrocortisone (ANUSOL-HC) 25 MG suppository, Place 25 mg rectally 2 (two) times daily as needed for hemorrhoids or itching., Disp: , Rfl:  lansoprazole (PREVACID) 30 MG capsule, Take 30 mg by mouth daily at 12 noon., Disp: , Rfl: ;  levothyroxine (SYNTHROID, LEVOTHROID) 50 MCG tablet, Take 50 mcg by mouth daily before breakfast., Disp: , Rfl: ;  lipase/protease/amylase (CREON-12/PANCREASE) 12000 UNITS CPEP capsule, Take 1 capsule by mouth 3 (three) times daily before meals., Disp: , Rfl: ;  losartan (COZAAR) 100 MG tablet, Take 100 mg by mouth every morning., Disp: , Rfl:  PARoxetine (PAXIL) 10 MG tablet, Take 10 mg by mouth every morning., Disp: , Rfl: ;  sodium chloride (OCEAN) 0.65 % SOLN nasal spray, Place 1 spray into both nostrils as needed for congestion. , Disp: , Rfl: ;  vitamin B-12 (CYANOCOBALAMIN) 500 MCG tablet, Take 500 mcg by mouth 2 (two) times daily., Disp: , Rfl:  Octreotide Acetate (SANDOSTATIN IJ), Inject 1 application as directed every 28 (twenty-eight) days. Pt is followed by Dr. Marin Olp office, Disp: , Rfl:  Current facility-administered medications:0.9 %  sodium chloride infusion, , Intravenous, Continuous, Ascencion Dike, PA-C, Last Rate: 75 mL/hr at 03/26/14 0817;  piperacillin-tazobactam (ZOSYN) IVPB 3.375 g, 3.375 g, Intravenous, Once, Ascencion Dike, PA-C   Results for orders placed during the hospital encounter of 03/26/14 (from the  past 48 hour(s))  APTT     Status: None   Collection Time    03/26/14  8:00 AM      Result Value Ref Range   aPTT 28  24 - 37 seconds  CBC WITH DIFFERENTIAL     Status: Abnormal   Collection Time    03/26/14  8:00 AM      Result Value Ref Range   WBC 8.8  4.0 - 10.5 K/uL   RBC 4.71  4.22 - 5.81 MIL/uL   Hemoglobin 13.4  13.0 - 17.0 g/dL   HCT 40.8  39.0 - 52.0 %   MCV 86.6  78.0 - 100.0 fL   MCH 28.5  26.0 - 34.0 pg   MCHC 32.8  30.0 - 36.0 g/dL   RDW 13.6  11.5 - 15.5 %   Platelets 177  150 -  400 K/uL   Neutrophils Relative % 92 (*) 43 - 77 %   Neutro Abs 8.1 (*) 1.7 - 7.7 K/uL   Lymphocytes Relative 7 (*) 12 - 46 %   Lymphs Abs 0.6 (*) 0.7 - 4.0 K/uL   Monocytes Relative 1 (*) 3 - 12 %   Monocytes Absolute 0.1  0.1 - 1.0 K/uL   Eosinophils Relative 0  0 - 5 %   Eosinophils Absolute 0.0  0.0 - 0.7 K/uL   Basophils Relative 0  0 - 1 %   Basophils Absolute 0.0  0.0 - 0.1 K/uL  COMPREHENSIVE METABOLIC PANEL     Status: Abnormal   Collection Time    03/26/14  8:00 AM      Result Value Ref Range   Sodium 141  137 - 147 mEq/L   Potassium 4.4  3.7 - 5.3 mEq/L   Chloride 103  96 - 112 mEq/L   CO2 25  19 - 32 mEq/L   Glucose, Bld 141 (*) 70 - 99 mg/dL   BUN 17  6 - 23 mg/dL   Creatinine, Ser 0.70  0.50 - 1.35 mg/dL   Calcium 9.3  8.4 - 10.5 mg/dL   Total Protein 7.7  6.0 - 8.3 g/dL   Albumin 3.4 (*) 3.5 - 5.2 g/dL   AST 29  0 - 37 U/L   ALT 19  0 - 53 U/L   Alkaline Phosphatase 76  39 - 117 U/L   Total Bilirubin 0.5  0.3 - 1.2 mg/dL   GFR calc non Af Amer >90  >90 mL/min   GFR calc Af Amer >90  >90 mL/min   Comment: (NOTE)     The eGFR has been calculated using the CKD EPI equation.     This calculation has not been validated in all clinical situations.     eGFR's persistently <90 mL/min signify possible Chronic Kidney     Disease.  PROTIME-INR     Status: None   Collection Time    03/26/14  8:00 AM      Result Value Ref Range   Prothrombin Time 13.3  11.6 - 15.2 seconds   INR 1.03  0.00 - 1.49   No results found.  Review of Systems  Constitutional: Negative for fever and chills.  Respiratory: Negative for cough.   Cardiovascular: Negative for chest pain.  Gastrointestinal: Negative for nausea, vomiting and abdominal pain.  Musculoskeletal: Negative for back pain.  Neurological: Negative for headaches.  Endo/Heme/Allergies: Does not bruise/bleed easily.    Blood pressure 129/84, pulse 77, temperature 97.6 F (36.4 C), temperature source Oral,  resp. rate 18,  SpO2 95.00%. Physical Exam  Constitutional: He appears well-developed and well-nourished.  Cardiovascular: Normal rate and regular rhythm.   Respiratory: Effort normal and breath sounds normal.  GI: Bowel sounds are normal.  Musculoskeletal: Normal range of motion. He exhibits no edema.  Neurological: He is alert.     Assessment/Plan Patient with history of metastatic neuroendocrine carcinoma to liver and prior Y90 radioembolization initially in 2011 to right lateral hepatic lobe. On 12/20/13 pt presented for additional Y90 to left hepatic lobe but treatment was not adequately performed secondary to inadvertent dissection of the left hepatic artery. He presents today for repeat attempt at  Y90 radioembolization of the left hepatic artery due to progression of disease. Details/risks of procedure d/w pt /wife with their understanding and consent.   Crissie Sickles Allred 03/26/2014, 9:02 AM

## 2014-03-26 NOTE — Progress Notes (Signed)
Patient said with reaction to dye he is to take 50mg  Benadryl pre procedure as well as 50mg  Prednisone (two 50 mg pills night prior and one 50mg  am pre procedure). Spoke to  [employer] PA and he stated for patient to take 50mg  Benadryl po and 50 mg Prednisone po now. Patient took meds at 0830 am.

## 2014-03-26 NOTE — Discharge Instructions (Signed)
Post Y-90 Radioembolization Discharge Instructions  You have been given a radioactive material during your procedure.  While it is safe for you to be discharged home from the hospital, you need to proceed directly home.    Do not use public transportation, including air travel, lasting more than 2 hours for 1 week.  Avoid crowded public places for 1 week.  Adult visitors should try to avoid close contact with you for 1 week.    Children and pregnant females should not visit or have close contact with you for 1 week.  Items that you touch are not radioactive.  Do not sleep in the same bed as your partner for 1 week, and a condom should be used for sexual activity during the first 24 hours.  Your blood may be radioactive and caution should be used if any bleeding occurs during the recovery period.  Body fluids may be radioactive for 24 hours.  Wash your hands after voiding.  Men should sit to urinate.  Dispose of any soiled materials (flush down toilet or place in trash at home) during the first day.  Drink 6 to 8 glasses of fluids per day for 5 days to hydrate yourself.  If you need to see a doctor during the first week, you must let them know that you were treated with yttrium-90 microspheres, and will be slightly radioactive.  They can call Interventional Radiology [phone] with any questions.Moderate Sedation, Adult Moderate sedation is given to help you relax or even sleep through a procedure. You may remain sleepy, be clumsy, or have poor balance for several hours following this procedure. Arrange for a responsible adult, family member, or friend to take you home. A responsible adult should stay with you for at least 24 hours or until the medicines have worn off.  Do not participate in any activities where you could become injured for the next 24 hours, or until you feel normal again. Do not:  Drive.  Swim.  Ride a bicycle.  Operate heavy machinery.  Cook.  Use power  tools.  Climb ladders.  Work at [employer].  Do not make important decisions or sign legal documents until you are improved.  Vomiting may occur if you eat too soon. When you can drink without vomiting, try water, juice, or soup. Try solid foods if you feel little or no nausea.  Only take over-the-counter or prescription medications for pain, discomfort, or fever as directed by your caregiver.If pain medications have been prescribed for you, ask your caregiver how soon it is safe to take them.  Make sure you and your family fully understands everything about the medication given to you. Make sure you understand what side effects may occur.  You should not drink alcohol, take sleeping pills, or medications that cause drowsiness for at least 24 hours.  If you smoke, do not smoke alone.  If you are feeling better, you may resume normal activities 24 hours after receiving sedation.  Keep all appointments as scheduled. Follow all instructions.  Ask questions if you do not understand. SEEK MEDICAL CARE IF:   Your skin is pale or bluish in color.  You continue to feel sick to your stomach (nauseous) or throw up (vomit).  Your pain is getting worse and not helped by medication.  You have bleeding or swelling.  You are still sleepy or feeling clumsy after 24 hours. SEEK IMMEDIATE MEDICAL CARE IF:   You develop a rash.  You have difficulty breathing.  You develop  any type of allergic problem.  You have a fever. Document Released: 07/13/2001 Document Revised: 01/10/2012 Document Reviewed: 06/25/2013 [facility] Patient Information 2014 [place].

## 2014-03-26 NOTE — Procedures (Signed)
Procedure:  Y-90 Radioembolization of liver Findings:  68mCi dose of SIR spheres administered in left hepatic artery.  No immediate complications.  See fully dictated procedure note.

## 2014-03-26 NOTE — Sedation Documentation (Signed)
Pt transported to [facility] for recovery.

## 2014-03-27 ENCOUNTER — Telehealth: Payer: Self-pay | Admitting: Emergency Medicine

## 2014-03-27 ENCOUNTER — Encounter (HOSPITAL_COMMUNITY): Payer: Medicare PPO

## 2014-03-27 ENCOUNTER — Other Ambulatory Visit (HOSPITAL_COMMUNITY): Payer: Self-pay | Admitting: Interventional Radiology

## 2014-03-27 DIAGNOSIS — C787 Secondary malignant neoplasm of liver and intrahepatic bile duct: Secondary | ICD-10-CM

## 2014-03-27 DIAGNOSIS — C7A1 Malignant poorly differentiated neuroendocrine tumors: Secondary | ICD-10-CM

## 2014-03-27 MED ORDER — LOSARTAN POTASSIUM 100 MG PO TABS: 100.0000 mg | ORAL_TABLET | Freq: Every morning | ORAL | Status: DC

## 2014-03-27 NOTE — Telephone Encounter (Signed)
Pt called to see when he can have his infected tooth pulled , post Y-90 procedure.    Called Dr Maryan Puls- stated in 2 weeks.  Pt was notified and also scheduled f/u appt for 53mo.

## 2014-03-27 NOTE — Addendum Note (Signed)
Addended by: Volanda Napoleon on: 03/27/2014 04:37 PM   Modules accepted: Orders

## 2014-04-08 ENCOUNTER — Other Ambulatory Visit: Payer: Self-pay | Admitting: Internal Medicine

## 2014-04-26 ENCOUNTER — Ambulatory Visit (HOSPITAL_BASED_OUTPATIENT_CLINIC_OR_DEPARTMENT_OTHER): Payer: Medicare PPO

## 2014-04-26 ENCOUNTER — Other Ambulatory Visit (HOSPITAL_BASED_OUTPATIENT_CLINIC_OR_DEPARTMENT_OTHER): Payer: Medicare PPO | Admitting: Lab

## 2014-04-26 ENCOUNTER — Other Ambulatory Visit: Payer: Self-pay | Admitting: *Deleted

## 2014-04-26 ENCOUNTER — Ambulatory Visit (HOSPITAL_BASED_OUTPATIENT_CLINIC_OR_DEPARTMENT_OTHER): Payer: Medicare PPO | Admitting: Hematology & Oncology

## 2014-04-26 DIAGNOSIS — C787 Secondary malignant neoplasm of liver and intrahepatic bile duct: Secondary | ICD-10-CM

## 2014-04-26 DIAGNOSIS — C7A00 Malignant carcinoid tumor of unspecified site: Secondary | ICD-10-CM

## 2014-04-26 DIAGNOSIS — D499 Neoplasm of unspecified behavior of unspecified site: Secondary | ICD-10-CM

## 2014-04-26 LAB — CBC WITH DIFFERENTIAL (CANCER CENTER ONLY)
BASO#: 0 10*3/uL (ref 0.0–0.2)
BASO%: 0.8 % (ref 0.0–2.0)
EOS%: 5.1 % (ref 0.0–7.0)
Eosinophils Absolute: 0.3 10*3/uL (ref 0.0–0.5)
HEMATOCRIT: 42.5 % (ref 38.7–49.9)
HGB: 14 g/dL (ref 13.0–17.1)
LYMPH#: 0.5 10*3/uL — ABNORMAL LOW (ref 0.9–3.3)
LYMPH%: 10.1 % — ABNORMAL LOW (ref 14.0–48.0)
MCH: 28.8 pg (ref 28.0–33.4)
MCHC: 32.9 g/dL (ref 32.0–35.9)
MCV: 87 fL (ref 82–98)
MONO#: 0.4 10*3/uL (ref 0.1–0.9)
MONO%: 7 % (ref 0.0–13.0)
NEUT%: 77 % (ref 40.0–80.0)
NEUTROS ABS: 4 10*3/uL (ref 1.5–6.5)
Platelets: 158 10*3/uL (ref 145–400)
RBC: 4.86 10*6/uL (ref 4.20–5.70)
RDW: 13.8 % (ref 11.1–15.7)
WBC: 5.1 10*3/uL (ref 4.0–10.0)

## 2014-04-26 MED ORDER — PREDNISONE 50 MG PO TABS: ORAL_TABLET | ORAL | Status: DC

## 2014-04-26 MED ORDER — OCTREOTIDE ACETATE 30 MG IM KIT
30.0000 mg | PACK | Freq: Once | INTRAMUSCULAR | Status: AC
  Administered 2014-04-26: 30 mg via INTRAMUSCULAR

## 2014-04-26 NOTE — Patient Instructions (Signed)

## 2014-04-26 NOTE — Progress Notes (Signed)
Hematology and Oncology Follow Up Visit  Brian Mays [id number] 17-Jan-1946 68 y.o. 04/26/2014   Principle Diagnosis:  Metastatic low grade neuroendocrine tumor-hepatic metastases  Current Therapy:    1 patient status post second yttrium-90 intrahepatic therapy  #2 Sandostatin LAR 30 mg IM every month     Interim History:  Brian Mays is back for followup. She underwent a second intrahepatic therapy with yttrium 90. He had this on May 26. He did well with this. He had a little nausea with this.  Otherwise he is doing to well. He's working. He's playing golf. He's had no cough or shortness of breath. He's had no leg swelling. There's been no rashes. He has had no significant fatigue. There's been no wheezing. He's had no change in bowel or bladder habits.  His last chromogranin A. was 20.   Medications: Current outpatient prescriptions:amLODipine (NORVASC) 5 MG tablet, Take 5 mg by mouth every morning. , Disp: , Rfl: ;  aspirin EC 81 MG tablet, Take 81 mg by mouth every morning., Disp: , Rfl: ;  Cholecalciferol 1000 UNITS tablet, Take 1,000 Units by mouth daily.  , Disp: , Rfl: ;  hydrocortisone (ANUSOL-HC) 25 MG suppository, Place 25 mg rectally 2 (two) times daily as needed for hemorrhoids or itching., Disp: , Rfl:  lansoprazole (PREVACID) 30 MG capsule, Take 30 mg by mouth daily at 12 noon., Disp: , Rfl: ;  levothyroxine (SYNTHROID, LEVOTHROID) 50 MCG tablet, Take 50 mcg by mouth daily before breakfast., Disp: , Rfl: ;  levothyroxine (SYNTHROID, LEVOTHROID) 50 MCG tablet, TAKE 1 TABLET EVERY DAY, Disp: 90 tablet, Rfl: 0 lipase/protease/amylase (CREON-12/PANCREASE) 12000 UNITS CPEP capsule, Take 1 capsule by mouth 3 (three) times daily before meals., Disp: , Rfl: ;  losartan (COZAAR) 100 MG tablet, Take 1 tablet (100 mg total) by mouth every morning., Disp: 90 tablet, Rfl: 3;  Octreotide Acetate (SANDOSTATIN IJ), Inject 1 application as directed every 28 (twenty-eight) days. Pt is followed by  Dr. Marin Olp office, Disp: , Rfl:  PARoxetine (PAXIL) 10 MG tablet, Take 10 mg by mouth every morning., Disp: , Rfl: ;  sodium chloride (OCEAN) 0.65 % SOLN nasal spray, Place 1 spray into both nostrils as needed for congestion. , Disp: , Rfl: ;  vitamin B-12 (CYANOCOBALAMIN) 500 MCG tablet, Take 500 mcg by mouth 2 (two) times daily., Disp: , Rfl: ;  predniSONE (DELTASONE) 50 MG tablet, Take 50mg  at 13 hours, 7 hours, and 1 hour before CT contrast, Disp: 3 tablet, Rfl: 0  Allergies:  Allergies  Allergen Reactions  . Iohexol      Code: HIVES, Desc: PER [name] @ [facility]IMARY [facility], PT IS ALLERGIC TO CONTRAST DYE 10/02/08/RM  05/01/10...needs full premeds per our protocol w/ [facility] imaging., Onset Date: [id number]     Past Medical History, Surgical history, Social history, and Family History were reviewed and updated.  Review of Systems: As above  Physical Exam:  vitals were not taken for this visit.  Well-developed well-nourished gentleman. Lungs are clear. Cardiac exam regular in rhythm. Abdomen soft. Has good bowels. Is no fluid wave. There is no abdominal mass. There is no palpable liver or spleen tip. Back exam shows no tenderness over the spine ribs or hips. Extremities shows no clubbing cyanosis or edema. Neurological exam shows no focal neurological deficits. Skin exam no rashes.  Lab Results  Component Value Date   WBC 5.1 04/26/2014   HGB 14.0 04/26/2014   HCT 42.5 04/26/2014   MCV 87 04/26/2014  PLT 158 04/26/2014     Chemistry      Component Value Date/Time   NA 141 04/26/2014 0901   NA 141 03/19/2014 1136   NA 140 01/25/2014 0823   K 4.1 04/26/2014 0901   K 3.9 03/19/2014 1136   K 3.6 01/25/2014 0823   CL 106 04/26/2014 0901   CL 96* 01/25/2014 0823   CO2 26 04/26/2014 0901   CO2 25 03/19/2014 1136   CO2 29 01/25/2014 0823   BUN 14 04/26/2014 0901   BUN 13.8 03/19/2014 1136   BUN 13 01/25/2014 0823   CREATININE 0.89 04/26/2014 0901   CREATININE 0.9 03/19/2014 1136   CREATININE 0.9 01/25/2014 0823       Component Value Date/Time   CALCIUM 9.4 04/26/2014 0901   CALCIUM 9.4 03/19/2014 1136   CALCIUM 9.1 01/25/2014 0823   ALKPHOS 81 04/26/2014 0901   ALKPHOS 71 03/19/2014 1136   ALKPHOS 64 01/25/2014 0823   AST 27 04/26/2014 0901   AST 30 03/19/2014 1136   AST 34 01/25/2014 0823   ALT 17 04/26/2014 0901   ALT 24 03/19/2014 1136   ALT 21 01/25/2014 0823   BILITOT 0.5 04/26/2014 0901   BILITOT 0.69 03/19/2014 1136   BILITOT 0.70 01/25/2014 0823         Impression and Plan: Brian Mays is 68 year old gentleman with metastatic neuroendocrine tumor. His disease has been pretty much confined to the liver. Again, he had his second intrahepatic therapy.  I think that we will see about another CT scan. I'll order 1 the end of July.  At the he goes to see the radiologist in the next couple weeks.  His quality of life is great.  I think if we find disease progression in the future, we will have to consider systemic therapy.  I'll see him back after he has his scans done in about 5 weeks.   Volanda Napoleon, MD 6/26/20156:09 PM

## 2014-05-01 ENCOUNTER — Other Ambulatory Visit (HOSPITAL_COMMUNITY): Payer: Self-pay | Admitting: Interventional Radiology

## 2014-05-01 ENCOUNTER — Ambulatory Visit
Admission: RE | Admit: 2014-05-01 | Discharge: 2014-05-01 | Disposition: A | Discharge status: Home / Self Care | Payer: Medicare PPO | Source: Ambulatory Visit | Attending: Interventional Radiology | Admitting: Interventional Radiology

## 2014-05-01 DIAGNOSIS — C787 Secondary malignant neoplasm of liver and intrahepatic bile duct: Secondary | ICD-10-CM

## 2014-05-01 DIAGNOSIS — C7A1 Malignant poorly differentiated neuroendocrine tumors: Secondary | ICD-10-CM

## 2014-05-03 LAB — COMPREHENSIVE METABOLIC PANEL
ALK PHOS: 81 U/L (ref 39–117)
ALT: 17 U/L (ref 0–53)
AST: 27 U/L (ref 0–37)
Albumin: 3.9 g/dL (ref 3.5–5.2)
BUN: 14 mg/dL (ref 6–23)
CALCIUM: 9.4 mg/dL (ref 8.4–10.5)
CO2: 26 mEq/L (ref 19–32)
CREATININE: 0.89 mg/dL (ref 0.50–1.35)
Chloride: 106 mEq/L (ref 96–112)
Glucose, Bld: 139 mg/dL — ABNORMAL HIGH (ref 70–99)
Potassium: 4.1 mEq/L (ref 3.5–5.3)
Sodium: 141 mEq/L (ref 135–145)
Total Bilirubin: 0.5 mg/dL (ref 0.2–1.2)
Total Protein: 7.2 g/dL (ref 6.0–8.3)

## 2014-05-03 LAB — CHROMOGRANIN A: Chromogranin A: 18 ng/mL — ABNORMAL HIGH (ref 1.9–15.0)

## 2014-05-03 LAB — LACTATE DEHYDROGENASE: LDH: 172 U/L (ref 94–250)

## 2014-05-24 ENCOUNTER — Ambulatory Visit: Payer: Medicare PPO

## 2014-05-24 ENCOUNTER — Ambulatory Visit: Payer: Medicare PPO | Admitting: Hematology & Oncology

## 2014-05-24 ENCOUNTER — Encounter: Payer: Self-pay | Admitting: *Deleted

## 2014-05-24 ENCOUNTER — Other Ambulatory Visit: Payer: Medicare PPO | Admitting: Lab

## 2014-05-24 NOTE — Progress Notes (Signed)
Called pt and verified that he was allergic to CT contrast. Pt stated he is allergic to the contrast. Informed pt he needs to take his pre-medications. Pt stated the instructions on how to take these premedications; pt was correct. Informed Imaging that he would take the pre-meds.

## 2014-05-27 ENCOUNTER — Other Ambulatory Visit: Payer: Self-pay | Admitting: *Deleted

## 2014-05-27 DIAGNOSIS — D499 Neoplasm of unspecified behavior of unspecified site: Secondary | ICD-10-CM

## 2014-05-27 MED ORDER — DIPHENHYDRAMINE HCL 50 MG PO TABS: 50.0000 mg | ORAL_TABLET | Freq: Once | ORAL | Status: DC

## 2014-05-27 MED ORDER — PREDNISONE 50 MG PO TABS: ORAL_TABLET | ORAL | Status: DC

## 2014-05-28 ENCOUNTER — Ambulatory Visit (HOSPITAL_BASED_OUTPATIENT_CLINIC_OR_DEPARTMENT_OTHER)
Admission: RE | Admit: 2014-05-28 | Discharge: 2014-05-28 | Disposition: A | Discharge status: Home / Self Care | Payer: Medicare PPO | Source: Ambulatory Visit | Attending: Hematology & Oncology | Admitting: Hematology & Oncology

## 2014-05-28 ENCOUNTER — Encounter (HOSPITAL_BASED_OUTPATIENT_CLINIC_OR_DEPARTMENT_OTHER): Payer: Self-pay

## 2014-05-28 ENCOUNTER — Other Ambulatory Visit: Payer: Self-pay | Admitting: Hematology & Oncology

## 2014-05-28 DIAGNOSIS — D489 Neoplasm of uncertain behavior, unspecified: Secondary | ICD-10-CM | POA: Diagnosis present

## 2014-05-28 DIAGNOSIS — I7779 Dissection of other artery: Secondary | ICD-10-CM | POA: Insufficient documentation

## 2014-05-28 DIAGNOSIS — D499 Neoplasm of unspecified behavior of unspecified site: Secondary | ICD-10-CM

## 2014-05-28 IMAGING — CT CT CTA ABD/PEL W/CM AND/OR W/O CM
2 of 9 series · 15 of 46 positions shown, 17 images · IV contrast (APPLIED)
Comparison: [DATE]

CLINICAL DATA: Carcinoid

EXAM:
CT ANGIOGRAPHY ABDOMEN AND PELVIS WITH CONTRAST AND WITHOUT CONTRAST
TECHNIQUE: Multidetector CT imaging of the abdomen and pelvis was performed
using the standard protocol during bolus administration of
intravenous contrast. Multiplanar reconstructed images and MIPs were
obtained and reviewed to evaluate the vascular anatomy.
CONTRAST:  100mL OMNIPAQUE IOHEXOL 350 MG/ML SOLN

[Series 7: arterial 3.0 coronal · coronal · arterial · 0.49mm/px · 3 of 101 slices shown]
[im 26/101  soft-tissue]
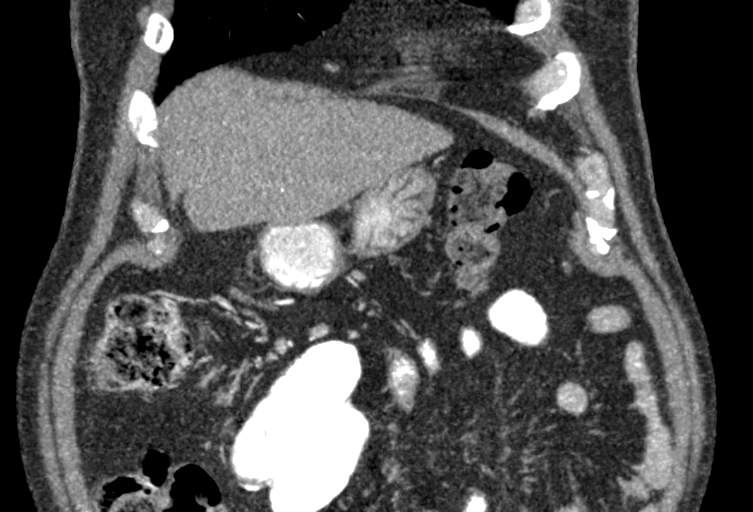
[im 51/101  soft-tissue]
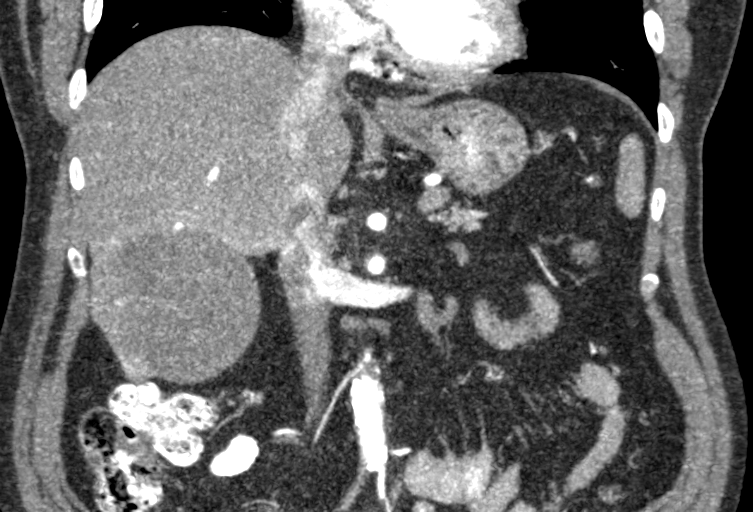
[im 76/101  soft-tissue]
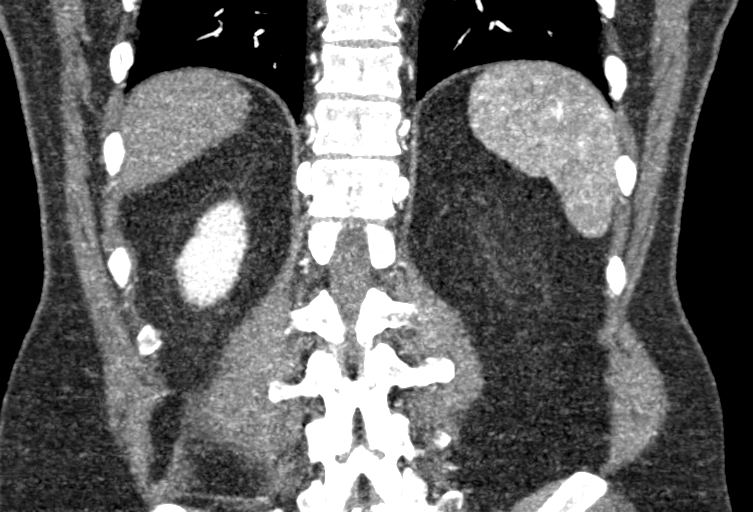

[Series 9: venous 2.0 b31f · axial · portal-venous · 0.77mm/px · z∈[-463,-61]mm · 12 of 233 slices shown, 14 images]
[im 16/233  soft-tissue]
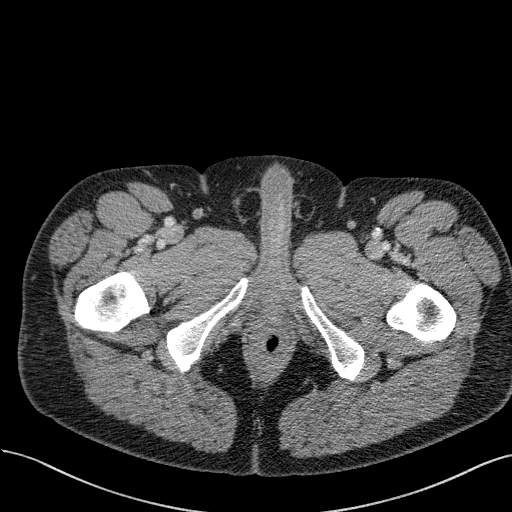
[im 16/233  bone]
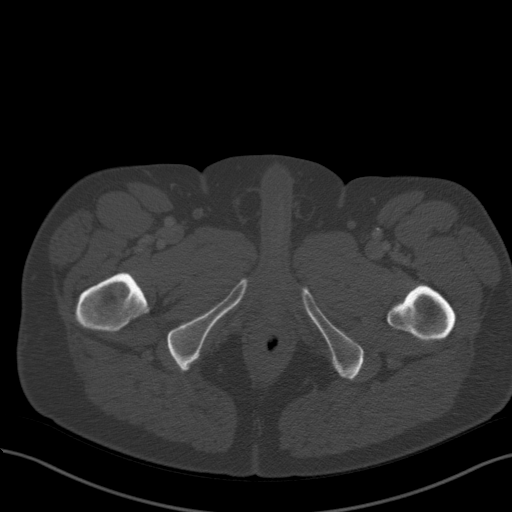
[im 31/233  soft-tissue]
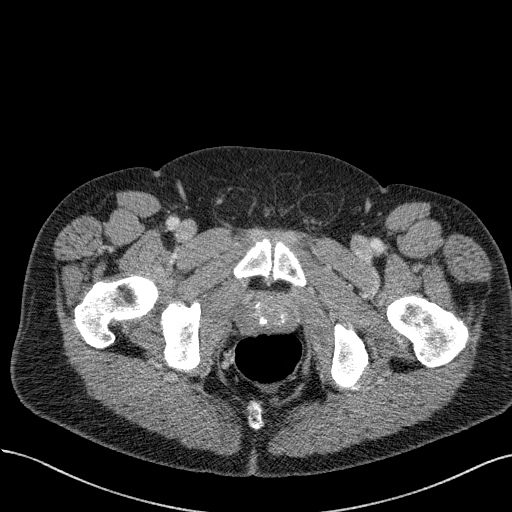
[im 47/233  soft-tissue]
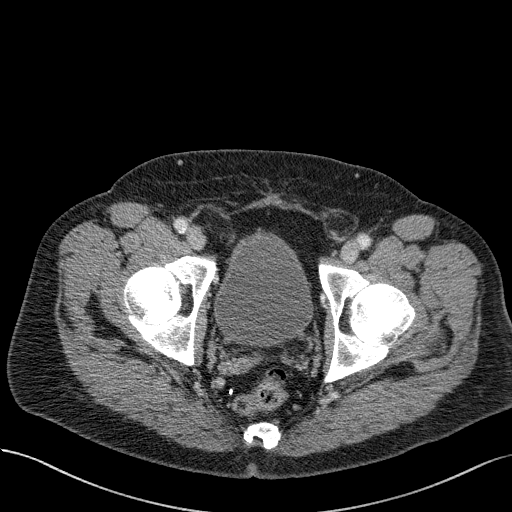
[im 78/233  soft-tissue]
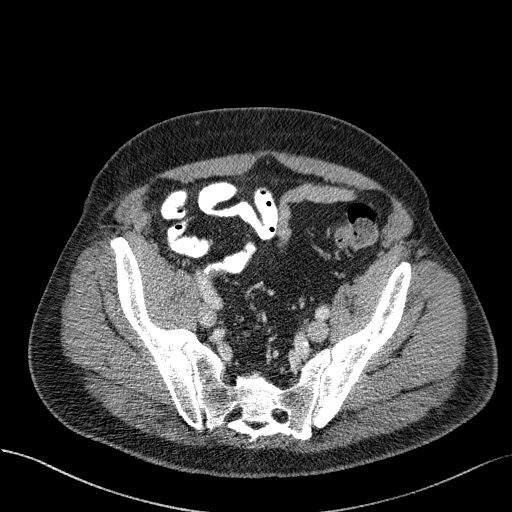
[im 93/233  soft-tissue]
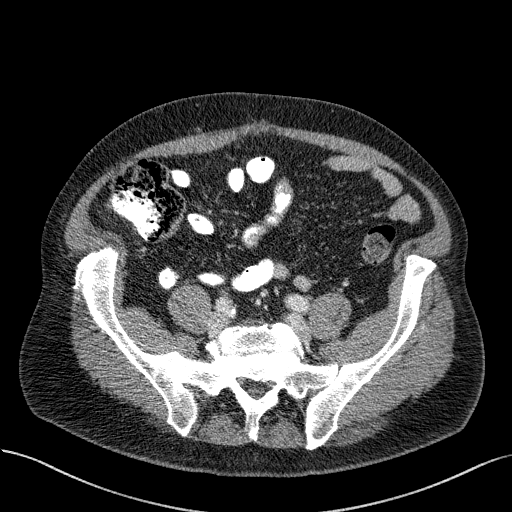
[im 109/233  soft-tissue]
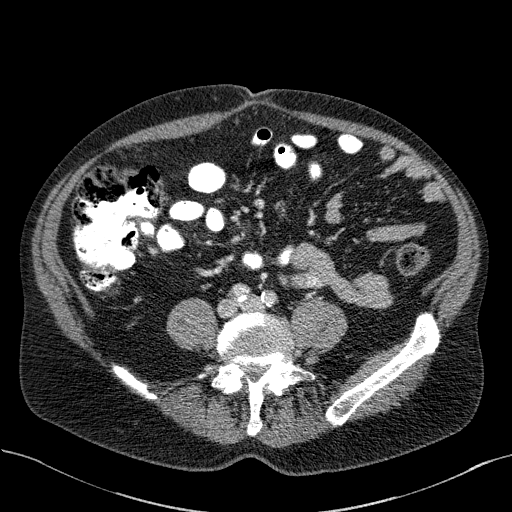
[im 124/233  soft-tissue]
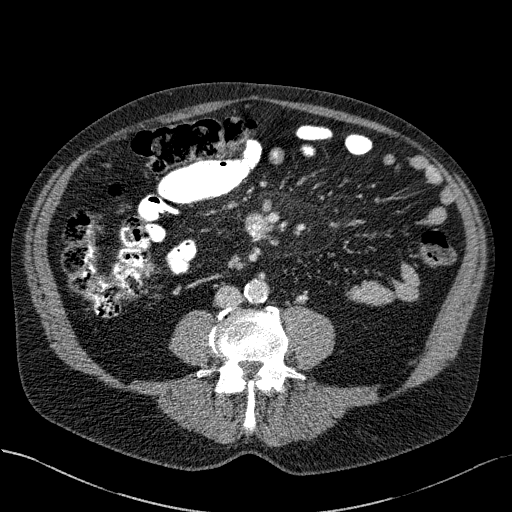
[im 140/233  soft-tissue]
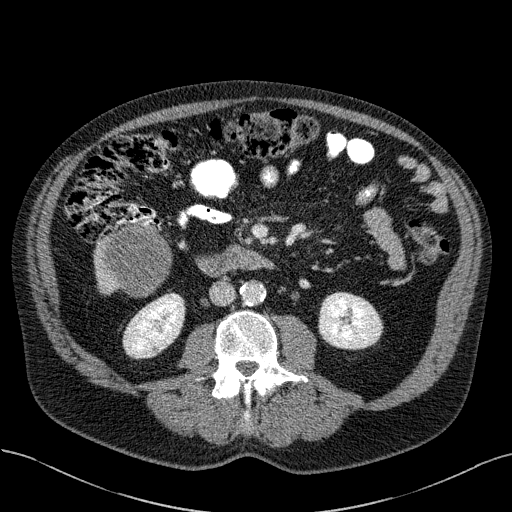
[im 155/233  soft-tissue]
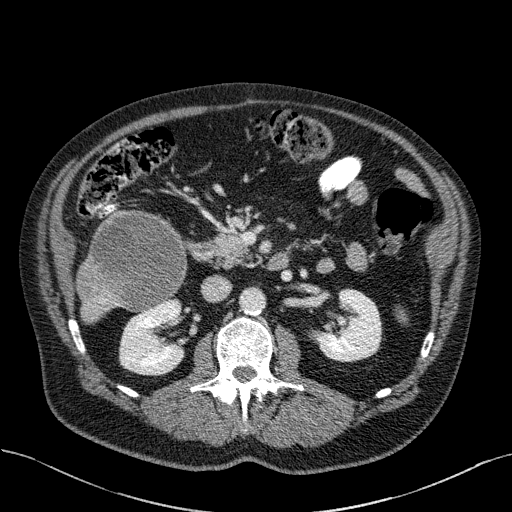
[im 155/233  bone]
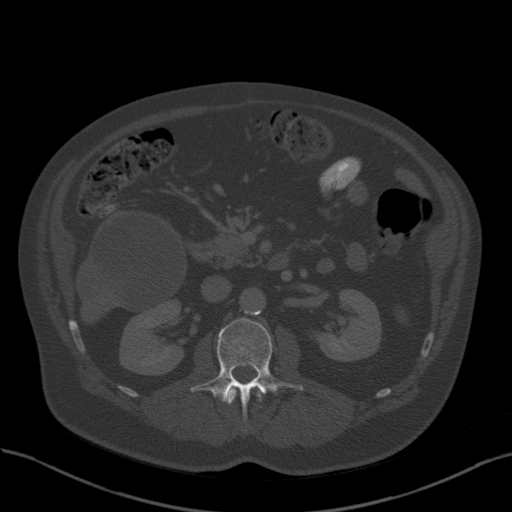
[im 186/233  soft-tissue]
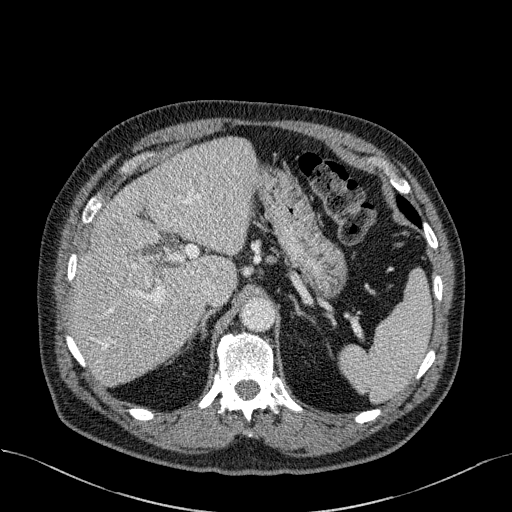
[im 202/233  soft-tissue]
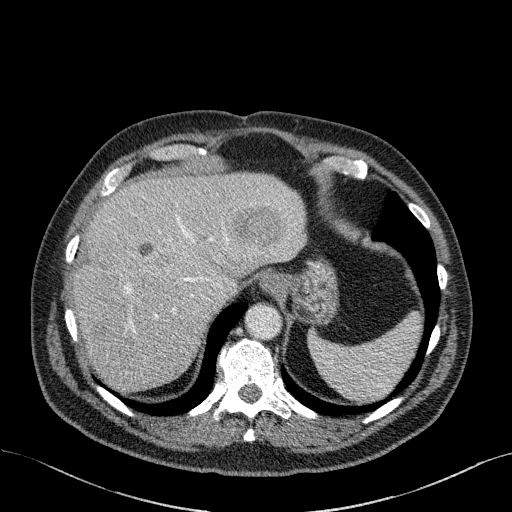
[im 217/233  soft-tissue]
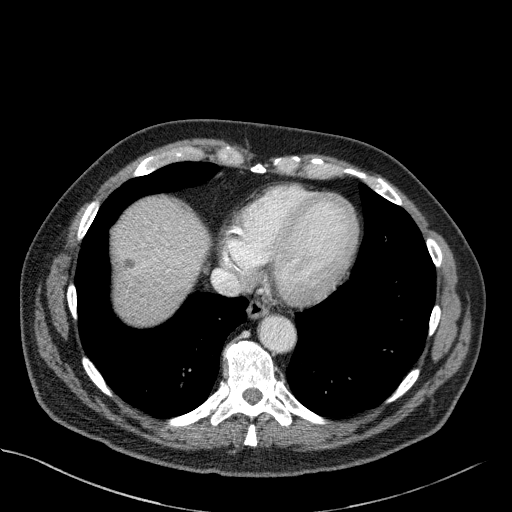

[15 of 46 positions shown; findings below may reference images not displayed]

FINDINGS: Aorta is non aneurysmal and patent with atherosclerotic changes.

Celiac is patent. A focal dissection involving the mid left gastric
artery is not significantly changed. Branches to the lesser
curvature of the stomach new are occluded the by coil embolization.
The left hepatic branch remains patent. Right and left hepatic
branches are patent. The splenic artery is patent. The
gastroduodenal artery remains occluded by coil embolization.

SMA is patent.  Branch vessels are patent.

Bilateral renal arteries are patent.

IMA is patent.  Branch vessels are patent.

Multiple liver lesions. 9 x 9 mm lesion at the dome on image 18 was
previously 10 x 15 mm. Lesion in the lateral segment of the left
lobe on image 33 measures 3.2 x 4.0 cm and previously measured 4.3 x
3.3 cm. 1.0 x 1.2 cm hypodense lesion in the anterior segment of the
right lobe on image 32 previously measured 1.2 cm. Posterior segment
right lobe lesion on image 36 measures 2.2 x 2.2 cm and previously
measured 2.4 x 2.0 cm. Central right lobe liver lesion adjacent to
the portal vein on image 41 measures 3.2 x 3.6 cm and previously
measured 3.4 x 3.2 cm. An ill-defined lesion slightly more anterior
is not significantly changed. Large lesion in the inferior tip of
the right lobe of the liver on image 82 measures 7.9 x 8.9 cm and
previously measured 7.8 x 8.5 cm. Overall, liver lesions are not
significantly changed with some larger and some smaller.

Postcholecystectomy. Spleen, adrenal glands are within normal
limits. Stable kidneys with a small hypodensity in the right kidney.
Atrophic changes of the pancreas.

For calcified mesenteric mass on image 114 measures 2.3 x 2.7 cm and
previously measured 3.0 x 2.5 cm. No abnormal adenopathy in the
mesenteric by measurement criteria. Several small mesenteric nodes
are noted.

Bilateral and an hernia contains adipose tissue.

Bladder and prostate are unremarkable.

No destructive bone lesion. Advanced degenerative disc disease at
L5-S1. Prominent disc herniation at L4-5 causes significant spinal
stenosis.

Review of the MIP images confirms the above findings.
IMPRESSION: Stable dissection in the left gastric artery.

Liver lesions an mesenteric mass are, overall, not significantly
changed compatible with stable disease.

## 2014-05-28 MED ORDER — IOHEXOL 350 MG/ML SOLN
100.0000 mL | Freq: Once | INTRAVENOUS | Status: AC | PRN
  Administered 2014-05-28: 100 mL via INTRAVENOUS

## 2014-05-31 ENCOUNTER — Encounter: Payer: Self-pay | Admitting: Family

## 2014-05-31 ENCOUNTER — Ambulatory Visit (HOSPITAL_BASED_OUTPATIENT_CLINIC_OR_DEPARTMENT_OTHER): Payer: Medicare PPO

## 2014-05-31 ENCOUNTER — Other Ambulatory Visit (HOSPITAL_BASED_OUTPATIENT_CLINIC_OR_DEPARTMENT_OTHER): Payer: Medicare PPO | Admitting: Lab

## 2014-05-31 ENCOUNTER — Ambulatory Visit (HOSPITAL_BASED_OUTPATIENT_CLINIC_OR_DEPARTMENT_OTHER): Payer: Medicare PPO | Admitting: Family

## 2014-05-31 VITALS — BP 132/74 | HR 74 | Temp 98.6°F | Resp 18 | Ht 70.0 in | Wt 207.0 lb

## 2014-05-31 DIAGNOSIS — C7A00 Malignant carcinoid tumor of unspecified site: Secondary | ICD-10-CM

## 2014-05-31 DIAGNOSIS — E340 Carcinoid syndrome: Secondary | ICD-10-CM

## 2014-05-31 DIAGNOSIS — C787 Secondary malignant neoplasm of liver and intrahepatic bile duct: Secondary | ICD-10-CM

## 2014-05-31 DIAGNOSIS — D499 Neoplasm of unspecified behavior of unspecified site: Secondary | ICD-10-CM

## 2014-05-31 LAB — CBC WITH DIFFERENTIAL (CANCER CENTER ONLY)
BASO#: 0 10*3/uL (ref 0.0–0.2)
BASO%: 0.5 % (ref 0.0–2.0)
EOS%: 3.4 % (ref 0.0–7.0)
Eosinophils Absolute: 0.2 10*3/uL (ref 0.0–0.5)
HEMATOCRIT: 40.5 % (ref 38.7–49.9)
HEMOGLOBIN: 13.4 g/dL (ref 13.0–17.1)
LYMPH#: 0.7 10*3/uL — ABNORMAL LOW (ref 0.9–3.3)
LYMPH%: 10.5 % — AB (ref 14.0–48.0)
MCH: 28.6 pg (ref 28.0–33.4)
MCHC: 33.1 g/dL (ref 32.0–35.9)
MCV: 86 fL (ref 82–98)
MONO#: 0.5 10*3/uL (ref 0.1–0.9)
MONO%: 7.7 % (ref 0.0–13.0)
NEUT#: 4.9 10*3/uL (ref 1.5–6.5)
NEUT%: 77.9 % (ref 40.0–80.0)
PLATELETS: 155 10*3/uL (ref 145–400)
RBC: 4.69 10*6/uL (ref 4.20–5.70)
RDW: 14.2 % (ref 11.1–15.7)
WBC: 6.3 10*3/uL (ref 4.0–10.0)

## 2014-05-31 LAB — CMP (CANCER CENTER ONLY)
ALBUMIN: 3.2 g/dL — AB (ref 3.3–5.5)
ALT(SGPT): 26 U/L (ref 10–47)
AST: 34 U/L (ref 11–38)
Alkaline Phosphatase: 78 U/L (ref 26–84)
BUN: 13 mg/dL (ref 7–22)
CALCIUM: 8.8 mg/dL (ref 8.0–10.3)
CHLORIDE: 102 meq/L (ref 98–108)
CO2: 30 meq/L (ref 18–33)
CREATININE: 0.7 mg/dL (ref 0.6–1.2)
Glucose, Bld: 132 mg/dL — ABNORMAL HIGH (ref 73–118)
POTASSIUM: 3.5 meq/L (ref 3.3–4.7)
Sodium: 141 mEq/L (ref 128–145)
Total Bilirubin: 0.6 mg/dl (ref 0.20–1.60)
Total Protein: 7.6 g/dL (ref 6.4–8.1)

## 2014-05-31 MED ORDER — OCTREOTIDE ACETATE 30 MG IM KIT
30.0000 mg | PACK | Freq: Once | INTRAMUSCULAR | Status: AC
  Administered 2014-05-31: 30 mg via INTRAMUSCULAR

## 2014-05-31 NOTE — Patient Instructions (Signed)

## 2014-05-31 NOTE — Progress Notes (Signed)
Brian Mays  Telephone:([phone]) [phone] Fax:(336) [phone]  ID: Brian Mays OB: 15-May-1946 MR#: [id number] BHA#:[id number] Patient Care Team: Stephens Shire, MD as PCP - General (Family Medicine) Volanda Napoleon, MD (Internal Medicine) Irene Shipper, MD (Gastroenterology)  DIAGNOSIS: Metastatic low grade neuroendocrine tumor-hepatic metastases  INTERVAL HISTORY: Brian Mays is back today for a follow-up. He underwent a third intrahepatic therapy with yttrium 90. He did well with this despite a little nausea. He has been feeling a little tired lately. He is still working and enjoys [employer]. He denies any fever, chills, n/v, cough, rash, headache, dizziness, SOB, chest pain, palpitations, abdominal pain, constipation, diarrhea, blood in urine or stool. He denies swelling tenderness, numbness or tingling in his extremities. His appetite is good. His last chromogranin A in June was 18.   CURRENT TREATMENT: 1. Patient status post second yttrium-90 intrahepatic therapy  2. Sandostatin LAR 30 mg IM every month  REVIEW OF SYSTEMS: All other 10 point review of systems is negative.   PAST MEDICAL HISTORY: Past Medical History  Diagnosis Date  . GERD (gastroesophageal reflux disease)   . Pancreatitis 1998    chronic  . HTN (hypertension)   . Hemorrhoid   . Hypothyroidism   . Colon polyps   . Metastatic carcinoma 2010    Dr Jonette Eva  . Diverticulosis   . Gallstones   . Hiatal hernia    PAST SURGICAL HISTORY: Past Surgical History  Procedure Laterality Date  . Cholecystectomy  1999  . Carcinoil resection  01/2009  . Appendectomy  1964   FAMILY HISTORY Family History  Problem Relation Age of Onset  . Kidney disease Mother   . Hyperlipidemia Mother   . Hypertension Mother   . COPD Father   . Ulcerative colitis Daughter    GYNECOLOGIC HISTORY:  No LMP for male patient.   SOCIAL HISTORY:  History   Social History  . Marital Status: Married    Spouse Name: N/A   Number of Children: 1  . Years of Education: N/A   Occupational History  . Sales    Social History Main Topics  . Smoking status: Former Smoker -- 1.50 packs/day for 39 years    Types: Cigarettes    Start date: 09/25/1959    Quit date: 11/01/1997  . Smokeless tobacco: Never Used     Comment: quit smoking 15 years ago  . Alcohol Use: Yes     Comment: 1/2 per day  . Drug Use: No  . Sexual Activity: Yes   Other Topics Concern  . Not on file   Social History Narrative   Regular Exercise -  NO   ADVANCED DIRECTIVES: <no information>  HEALTH MAINTENANCE: History  Substance Use Topics  . Smoking status: Former Smoker -- 1.50 packs/day for 39 years    Types: Cigarettes    Start date: 09/25/1959    Quit date: 11/01/1997  . Smokeless tobacco: Never Used     Comment: quit smoking 15 years ago  . Alcohol Use: Yes     Comment: 1/2 per day   Colonoscopy: PAP: Bone density: Lipid panel:  Allergies  Allergen Reactions  . Iohexol      Code: HIVES, Desc: PER [name] @ [facility], PT IS ALLERGIC TO CONTRAST DYE 10/02/08/RM  05/01/10...needs full premeds per our protocol w/ [facility]., Onset Date: [id number]    Current Outpatient Prescriptions  Medication Sig Dispense Refill  . amLODipine (NORVASC) 5 MG tablet Take 5 mg by mouth  every morning.       [name] aspirin EC 81 MG tablet Take 81 mg by mouth every morning.      . Cholecalciferol 1000 UNITS tablet Take 1,000 Units by mouth daily.        . hydrocortisone (ANUSOL-HC) 25 MG suppository Place 25 mg rectally 2 (two) times daily as needed for hemorrhoids or itching.      . lansoprazole (PREVACID) 30 MG capsule Take 30 mg by mouth daily at 12 noon.      [name] levothyroxine (SYNTHROID, LEVOTHROID) 50 MCG tablet Take 50 mcg by mouth daily before breakfast.      . lipase/protease/amylase (CREON-12/PANCREASE) 12000 UNITS CPEP capsule Take 1 capsule by mouth 3 (three) times daily before meals.      [name] losartan (COZAAR) 100 MG tablet Take 1 tablet  (100 mg total) by mouth every morning.  90 tablet  3  . Octreotide Acetate (SANDOSTATIN IJ) Inject 1 application as directed every 28 (twenty-eight) days. Pt is followed by Dr. Marin Olp office      . PARoxetine (PAXIL) 10 MG tablet Take 10 mg by mouth every morning.      . sodium chloride (OCEAN) 0.65 % SOLN nasal spray Place 1 spray into both nostrils as needed for congestion.       . vitamin B-12 (CYANOCOBALAMIN) 500 MCG tablet Take 500 mcg by mouth 2 (two) times daily.       No current facility-administered medications for this visit.   OBJECTIVE: Filed Vitals:   05/31/14 0954  BP: 132/74  Pulse: 74  Temp: 98.6 F (37 C)  Resp: 18   Body mass index is 29.7 kg/(m^2). ECOG FS:0 - Asymptomatic Ocular: Sclerae unicteric, pupils equal, round and reactive to light Ear-nose-throat: Oropharynx clear, dentition fair Lymphatic: No cervical or supraclavicular adenopathy Lungs no rales or rhonchi, good excursion bilaterally Heart regular rate and rhythm, no murmur appreciated Abd soft, nontender, positive bowel sounds MSK no focal spinal tenderness, no joint edema Neuro: non-focal, well-oriented, appropriate affect Breasts: Deferred  LAB RESULTS: CMP     Component Value Date/Time   NA 141 05/31/2014 0928   NA 141 04/26/2014 0901   NA 141 03/19/2014 1136   K 3.5 05/31/2014 0928   K 4.1 04/26/2014 0901   K 3.9 03/19/2014 1136   CL 102 05/31/2014 0928   CL 106 04/26/2014 0901   CO2 30 05/31/2014 0928   CO2 26 04/26/2014 0901   CO2 25 03/19/2014 1136   GLUCOSE 132* 05/31/2014 0928   GLUCOSE 139* 04/26/2014 0901   GLUCOSE 125 03/19/2014 1136   BUN 13 05/31/2014 0928   BUN 14 04/26/2014 0901   BUN 13.8 03/19/2014 1136   CREATININE 0.7 05/31/2014 0928   CREATININE 0.89 04/26/2014 0901   CREATININE 0.9 03/19/2014 1136   CALCIUM 8.8 05/31/2014 0928   CALCIUM 9.4 04/26/2014 0901   CALCIUM 9.4 03/19/2014 1136   PROT 7.6 05/31/2014 0928   PROT 7.2 04/26/2014 0901   PROT 7.2 03/19/2014 1136   ALBUMIN 3.9  04/26/2014 0901   ALBUMIN 3.3* 03/19/2014 1136   AST 34 05/31/2014 0928   AST 27 04/26/2014 0901   AST 30 03/19/2014 1136   ALT 26 05/31/2014 0928   ALT 17 04/26/2014 0901   ALT 24 03/19/2014 1136   ALKPHOS 78 05/31/2014 0928   ALKPHOS 81 04/26/2014 0901   ALKPHOS 71 03/19/2014 1136   BILITOT 0.60 05/31/2014 0928   BILITOT 0.5 04/26/2014 0901   BILITOT 0.69 03/19/2014 1136  GFRNONAA >90 03/26/2014 0800   GFRAA >90 03/26/2014 0800   No results found for this basename: SPEP, UPEP,  kappa and lambda light chains   Lab Results  Component Value Date   WBC 6.3 05/31/2014   NEUTROABS 4.9 05/31/2014   HGB 13.4 05/31/2014   HCT 40.5 05/31/2014   MCV 86 05/31/2014   PLT 155 05/31/2014   No results found for this basename: LABCA2   No components found with this basename: [id number]   No results found for this basename: INR,  in the last 168 hours  STUDIES: Ct Angio Abd/pel W/ And/or W/o  05/28/2014   CLINICAL DATA:  Carcinoid  EXAM: CT ANGIOGRAPHY ABDOMEN AND PELVIS WITH CONTRAST AND WITHOUT CONTRAST  TECHNIQUE: Multidetector CT imaging of the abdomen and pelvis was performed using the standard protocol during bolus administration of intravenous contrast. Multiplanar reconstructed images and MIPs were obtained and reviewed to evaluate the vascular anatomy.  CONTRAST:  120mL OMNIPAQUE IOHEXOL 350 MG/ML SOLN  COMPARISON:  02/21/2014  FINDINGS: Aorta is non aneurysmal and patent with atherosclerotic changes.  Celiac is patent. A focal dissection involving the mid left gastric artery is not significantly changed. Branches to the lesser curvature of the stomach new are occluded the by coil embolization. The left hepatic branch remains patent. Right and left hepatic branches are patent. The splenic artery is patent. The gastroduodenal artery remains occluded by coil embolization.  SMA is patent.  Branch vessels are patent.  Bilateral renal arteries are patent.  IMA is patent.  Branch vessels are patent.  Multiple liver  lesions. 9 x 9 mm lesion at the dome on image 18 was previously 10 x 15 mm. Lesion in the lateral segment of the left lobe on image 33 measures 3.2 x 4.0 cm and previously measured 4.3 x 3.3 cm. 1.0 x 1.2 cm hypodense lesion in the anterior segment of the right lobe on image 32 previously measured 1.2 cm. Posterior segment right lobe lesion on image 36 measures 2.2 x 2.2 cm and previously measured 2.4 x 2.0 cm. Central right lobe liver lesion adjacent to the portal vein on image 41 measures 3.2 x 3.6 cm and previously measured 3.4 x 3.2 cm. An ill-defined lesion slightly more anterior is not significantly changed. Large lesion in the inferior tip of the right lobe of the liver on image 82 measures 7.9 x 8.9 cm and previously measured 7.8 x 8.5 cm. Overall, liver lesions are not significantly changed with some larger and some smaller.  Postcholecystectomy. Spleen, adrenal glands are within normal limits. Stable kidneys with a small hypodensity in the right kidney. Atrophic changes of the pancreas.  For calcified mesenteric mass on image 114 measures 2.3 x 2.7 cm and previously measured 3.0 x 2.5 cm. No abnormal adenopathy in the mesenteric by measurement criteria. Several small mesenteric nodes are noted.  Bilateral and an hernia contains adipose tissue.  Bladder and prostate are unremarkable.  No destructive bone lesion. Advanced degenerative disc disease at L5-S1. Prominent disc herniation at L4-5 causes significant spinal stenosis.  Review of the MIP images confirms the above findings.  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  Liver lesions an mesenteric mass are, overall, not significantly changed compatible with stable disease.   Electronically Signed   By: Maryclare Bean M.D.   On: 05/28/2014 11:59   ASSESSMENT/PLAN: Mr. Blanchfield is 68 year old gentleman with metastatic neuroendocrine tumor. His disease has been pretty much confined to the liver. Again, he had his third  intrahepatic therapy. He  asymptomatic other that some occasional fatigue.  He will get his Sandostatin today as planned.  We discussed his CT and labs in detail. His liver disease appears to be stable at this time.  More than likely if we find disease progression in the future, we will have to consider systemic therapy.  We will see him back in one month for Sandostatin injection and then in two months for labs and follow-up. All questions were answered and he is in agreement with the plan. HE knows to call here with any questions or concerns and to go to the ED in the event of an emergency. We can certainly see him back sooner if need be.   Eliezer Bottom, NP 05/31/2014 10:13 AM

## 2014-06-03 ENCOUNTER — Telehealth: Payer: Self-pay | Admitting: Hematology & Oncology

## 2014-06-03 NOTE — Telephone Encounter (Signed)
[place]: [id number]  Status: Approved  Dates: 05/31/2014-08/29/2014 CPT: G6815

## 2014-06-05 ENCOUNTER — Ambulatory Visit
Admission: RE | Admit: 2014-06-05 | Discharge: 2014-06-05 | Disposition: A | Discharge status: Home / Self Care | Payer: Medicare PPO | Source: Ambulatory Visit | Attending: Interventional Radiology | Admitting: Interventional Radiology

## 2014-06-05 DIAGNOSIS — C7A1 Malignant poorly differentiated neuroendocrine tumors: Secondary | ICD-10-CM

## 2014-06-05 DIAGNOSIS — C787 Secondary malignant neoplasm of liver and intrahepatic bile duct: Secondary | ICD-10-CM

## 2014-06-05 LAB — CHROMOGRANIN A: CHROMOGRANIN A: 20 ng/mL — AB (ref <=15)

## 2014-06-05 LAB — LACTATE DEHYDROGENASE: LDH: 173 U/L (ref 94–250)

## 2014-06-05 NOTE — Progress Notes (Signed)
Appetite:  Good.    Some fatigue.  Has been assisting his wife who is 2 weeks post TKR.    Occasionally experiences very minimal Right flank discomfort of short duration.   Does not require pain meds.  Overall feels that he is doing well.    Kennidee Heyne Riki Rusk, RN 06/05/2014 1:37 PM

## 2014-06-28 ENCOUNTER — Ambulatory Visit (HOSPITAL_BASED_OUTPATIENT_CLINIC_OR_DEPARTMENT_OTHER): Payer: Medicare PPO

## 2014-06-28 VITALS — BP 117/73 | HR 77 | Temp 97.2°F | Resp 16

## 2014-06-28 DIAGNOSIS — C7A00 Malignant carcinoid tumor of unspecified site: Secondary | ICD-10-CM

## 2014-06-28 DIAGNOSIS — E340 Carcinoid syndrome: Secondary | ICD-10-CM

## 2014-06-28 DIAGNOSIS — D499 Neoplasm of unspecified behavior of unspecified site: Secondary | ICD-10-CM

## 2014-06-28 DIAGNOSIS — C787 Secondary malignant neoplasm of liver and intrahepatic bile duct: Secondary | ICD-10-CM

## 2014-06-28 MED ORDER — OCTREOTIDE ACETATE 30 MG IM KIT
30.0000 mg | PACK | Freq: Once | INTRAMUSCULAR | Status: AC
  Administered 2014-06-28: 30 mg via INTRAMUSCULAR

## 2014-06-28 NOTE — Patient Instructions (Signed)

## 2014-07-10 ENCOUNTER — Other Ambulatory Visit: Payer: Self-pay | Admitting: Hematology & Oncology

## 2014-07-26 ENCOUNTER — Ambulatory Visit (HOSPITAL_BASED_OUTPATIENT_CLINIC_OR_DEPARTMENT_OTHER): Payer: Medicare PPO

## 2014-07-26 ENCOUNTER — Encounter: Payer: Self-pay | Admitting: Hematology & Oncology

## 2014-07-26 ENCOUNTER — Telehealth: Payer: Self-pay | Admitting: Hematology & Oncology

## 2014-07-26 ENCOUNTER — Other Ambulatory Visit (HOSPITAL_BASED_OUTPATIENT_CLINIC_OR_DEPARTMENT_OTHER): Payer: Medicare PPO | Admitting: Lab

## 2014-07-26 ENCOUNTER — Ambulatory Visit (HOSPITAL_BASED_OUTPATIENT_CLINIC_OR_DEPARTMENT_OTHER): Payer: Medicare PPO | Admitting: Hematology & Oncology

## 2014-07-26 ENCOUNTER — Other Ambulatory Visit: Payer: Self-pay | Admitting: Nurse Practitioner

## 2014-07-26 VITALS — BP 132/75 | HR 72 | Temp 98.1°F | Resp 18 | Ht 70.0 in | Wt 209.0 lb

## 2014-07-26 DIAGNOSIS — C7A00 Malignant carcinoid tumor of unspecified site: Secondary | ICD-10-CM

## 2014-07-26 DIAGNOSIS — D499 Neoplasm of unspecified behavior of unspecified site: Secondary | ICD-10-CM

## 2014-07-26 DIAGNOSIS — E340 Carcinoid syndrome: Secondary | ICD-10-CM

## 2014-07-26 DIAGNOSIS — C787 Secondary malignant neoplasm of liver and intrahepatic bile duct: Secondary | ICD-10-CM

## 2014-07-26 DIAGNOSIS — Z23 Encounter for immunization: Secondary | ICD-10-CM

## 2014-07-26 LAB — CBC WITH DIFFERENTIAL (CANCER CENTER ONLY)
BASO#: 0 10*3/uL (ref 0.0–0.2)
BASO%: 0.5 % (ref 0.0–2.0)
EOS ABS: 0.4 10*3/uL (ref 0.0–0.5)
EOS%: 5.8 % (ref 0.0–7.0)
HCT: 40.9 % (ref 38.7–49.9)
HEMOGLOBIN: 13.3 g/dL (ref 13.0–17.1)
LYMPH#: 0.9 10*3/uL (ref 0.9–3.3)
LYMPH%: 14.1 % (ref 14.0–48.0)
MCH: 28.5 pg (ref 28.0–33.4)
MCHC: 32.5 g/dL (ref 32.0–35.9)
MCV: 88 fL (ref 82–98)
MONO#: 0.4 10*3/uL (ref 0.1–0.9)
MONO%: 6.3 % (ref 0.0–13.0)
NEUT#: 4.5 10*3/uL (ref 1.5–6.5)
NEUT%: 73.3 % (ref 40.0–80.0)
Platelets: 185 10*3/uL (ref 145–400)
RBC: 4.67 10*6/uL (ref 4.20–5.70)
RDW: 14.2 % (ref 11.1–15.7)
WBC: 6.1 10*3/uL (ref 4.0–10.0)

## 2014-07-26 MED ORDER — PREDNISONE 50 MG PO TABS: ORAL_TABLET | ORAL | Status: DC

## 2014-07-26 MED ORDER — INFLUENZA VAC SPLIT QUAD 0.5 ML IM SUSY
0.5000 mL | PREFILLED_SYRINGE | Freq: Once | INTRAMUSCULAR | Status: AC
  Administered 2014-07-26: 0.5 mL via INTRAMUSCULAR
  Filled 2014-07-26: qty 0.5

## 2014-07-26 MED ORDER — OCTREOTIDE ACETATE 30 MG IM KIT
PACK | INTRAMUSCULAR | Status: AC
  Filled 2014-07-26: qty 1

## 2014-07-26 MED ORDER — DIPHENHYDRAMINE HCL 50 MG PO TABS: 25.0000 mg | ORAL_TABLET | Freq: Once | ORAL | Status: DC

## 2014-07-26 MED ORDER — OCTREOTIDE ACETATE 30 MG IM KIT
30.0000 mg | PACK | Freq: Once | INTRAMUSCULAR | Status: AC
  Administered 2014-07-26: 30 mg via INTRAMUSCULAR

## 2014-07-26 NOTE — Telephone Encounter (Signed)
Due to pts allergy to contrast. Per Dr.Ennever i have sent in scripts for Prednisone 50 mg to take and Benadryl 25mg . Pt is aware of the scripts and how to take them prior to his scan.

## 2014-07-26 NOTE — Progress Notes (Signed)
Hematology and Oncology Follow Up Visit  Brian Mays [id number] 04-21-1946 68 y.o. 07/26/2014   Principle Diagnosis:  Metastatic low grade neuroendocrine tumor-hepatic metastases   Current Therapy:    S/p second yttrium-90 intrahepatic therapy  #2 Sandostatin LAR 30 mg IM every month     Interim History:  Brian Mays is back for followup. He is doing quite well. He has a visitor over from anyone. This is his wife's friend. They were going down to [facility] [place] next week.  Otherwise he's been doing okay. Still working. He is playing golf on occasion.  He's had no diarrhea. He's had no abdominal pain. He's had no cough. There's been no wheezing.  His last chromogranin A. level was 20. This was back in late July.  He's had no rashes. He's had no leg swelling. He's had no headache. There's been no visual issues.  Medications: Current outpatient prescriptions:amLODipine (NORVASC) 5 MG tablet, Take 5 mg by mouth every morning. , Disp: , Rfl: ;  aspirin EC 81 MG tablet, Take 81 mg by mouth every morning., Disp: , Rfl: ;  Cholecalciferol 1000 UNITS tablet, Take 1,000 Units by mouth daily.  , Disp: , Rfl: ;  hydrocortisone (ANUSOL-HC) 25 MG suppository, Place 25 mg rectally 2 (two) times daily as needed for hemorrhoids or itching., Disp: , Rfl:  lansoprazole (PREVACID) 30 MG capsule, Take 30 mg by mouth daily at 12 noon., Disp: , Rfl: ;  levothyroxine (SYNTHROID, LEVOTHROID) 50 MCG tablet, Take 50 mcg by mouth daily before breakfast., Disp: , Rfl: ;  lipase/protease/amylase (CREON-12/PANCREASE) 12000 UNITS CPEP capsule, Take 1 capsule by mouth 3 (three) times daily before meals., Disp: , Rfl:  losartan (COZAAR) 100 MG tablet, Take 1 tablet (100 mg total) by mouth every morning., Disp: 90 tablet, Rfl: 3;  Octreotide Acetate (SANDOSTATIN IJ), Inject 1 application as directed every 28 (twenty-eight) days. Pt is followed by Dr. Marin Olp office, Disp: , Rfl: ;  PARoxetine (PAXIL) 10 MG tablet,  TAKE 1 TABLET BY MOUTH EVERY DAY, Disp: 30 tablet, Rfl: 3 vitamin B-12 (CYANOCOBALAMIN) 500 MCG tablet, Take 500 mcg by mouth 2 (two) times daily., Disp: , Rfl: ;  sodium chloride (OCEAN) 0.65 % SOLN nasal spray, Place 1 spray into both nostrils as needed for congestion. , Disp: , Rfl:   Allergies:  Allergies  Allergen Reactions  . Iohexol      Code: HIVES, Desc: PER [name] @ [facility], PT IS ALLERGIC TO CONTRAST DYE 10/02/08/RM  05/01/10...needs full premeds per our protocol w/ [facility]., Onset Date: [id number]     Past Medical History, Surgical history, Social history, and Family History were reviewed and updated.  Review of Systems: As above  Physical Exam:  height is 5\' 10"  (1.778 m) and weight is 209 lb (94.802 kg). His oral temperature is 98.1 F (36.7 C). His blood pressure is 132/75 and his pulse is 72. His respiration is 18.   Well-developed and well-nourished white gentleman. Head and exam shows no ocular or oral lesions. There are no palpable cervical or supraclavicular lymph nodes. Thyroid is not palpable. Lungs are clear. Cardiac exam regular rate and rhythm. Abdomen is soft. He has laparotomy scar. There is no fluid wave. There is no guarding. He is no palpable liver or spleen tip. Back exam no tenderness over the spine, ribs or hips. Extremities shows no clubbing, cyanosis or edema. Skin exam no rashes. Neurological exam is nonfocal. Lab Results  Component Value Date   WBC  6.1 07/26/2014   HGB 13.3 07/26/2014   HCT 40.9 07/26/2014   MCV 88 07/26/2014   PLT 185 07/26/2014     Chemistry      Component Value Date/Time   NA 141 05/31/2014 0928   NA 141 04/26/2014 0901   NA 141 03/19/2014 1136   K 3.5 05/31/2014 0928   K 4.1 04/26/2014 0901   K 3.9 03/19/2014 1136   CL 102 05/31/2014 0928   CL 106 04/26/2014 0901   CO2 30 05/31/2014 0928   CO2 26 04/26/2014 0901   CO2 25 03/19/2014 1136   BUN 13 05/31/2014 0928   BUN 14 04/26/2014 0901   BUN 13.8 03/19/2014 1136   CREATININE 0.7  05/31/2014 0928   CREATININE 0.89 04/26/2014 0901   CREATININE 0.9 03/19/2014 1136      Component Value Date/Time   CALCIUM 8.8 05/31/2014 0928   CALCIUM 9.4 04/26/2014 0901   CALCIUM 9.4 03/19/2014 1136   ALKPHOS 78 05/31/2014 0928   ALKPHOS 81 04/26/2014 0901   ALKPHOS 71 03/19/2014 1136   AST 34 05/31/2014 0928   AST 27 04/26/2014 0901   AST 30 03/19/2014 1136   ALT 26 05/31/2014 0928   ALT 17 04/26/2014 0901   ALT 24 03/19/2014 1136   BILITOT 0.60 05/31/2014 0928   BILITOT 0.5 04/26/2014 0901   BILITOT 0.69 03/19/2014 1136         Impression and Plan: Brian Mays is 68 year old gentleman with a neuroendocrine carcinoma. He has liver metastases. He had a second intrahepatic therapy.  His last scan was about 2 hapless ago. I do bout have to repeat the scan.  He will continue on the octreotide. He's doing well with this.  He is a good quality of life. He's doing what he pretty much likes to do.  I'll plan to get him back in one month just for his Sandostatin injection.  I'll see him back in 2 months.   Volanda Napoleon, MD 9/25/201510:05 AM

## 2014-07-26 NOTE — Patient Instructions (Signed)
Octreotide injection solution What is this medicine? OCTREOTIDE (ok TREE oh tide) is used to reduce blood levels of growth hormone in patients with a condition called acromegaly. This medicine also reduces flushing and watery diarrhea caused by certain types of cancer. This medicine may be used for other purposes; ask your health care provider or pharmacist if you have questions. COMMON BRAND NAME(S): Sandostatin, Sandostatin LAR What should I tell my health care provider before I take this medicine? They need to know if you have any of these conditions: -gallbladder disease -kidney disease -liver disease -an unusual or allergic reaction to octreotide, other medicines, foods, dyes, or preservatives -pregnant or trying to get pregnant -breast-feeding How should I use this medicine? This medicine is for injection under the skin or into a vein (only in emergency situations). It is usually given by a health care professional in a hospital or clinic setting. If you get this medicine at home, you will be taught how to prepare and give this medicine. Allow the injection solution to come to room temperature before use. Do not warm it artificially. Use exactly as directed. Take your medicine at regular intervals. Do not take your medicine more often than directed. It is important that you put your used needles and syringes in a special sharps container. Do not put them in a trash can. If you do not have a sharps container, call your pharmacist or healthcare provider to get one. Talk to your pediatrician regarding the use of this medicine in children. Special care may be needed. Overdosage: If you think you have taken too much of this medicine contact a poison control center or emergency room at once. NOTE: This medicine is only for you. Do not share this medicine with others. What if I miss a dose? If you miss a dose, take it as soon as you can. If it is almost time for your next dose, take only that  dose. Do not take double or extra doses. What may interact with this medicine? Do not take this medicine with any of the following medications: -cisapride -droperidol -general anesthetics -grepafloxacin -perphenazine -thioridazine This medicine may also interact with the following medications: -bromocriptine -cyclosporine -diuretics -medicines for blood pressure, heart disease, irregular heart beat -medicines for diabetes, including insulin -quinidine This list may not describe all possible interactions. Give your health care provider a list of all the medicines, herbs, non-prescription drugs, or dietary supplements you use. Also tell them if you smoke, drink alcohol, or use illegal drugs. Some items may interact with your medicine. What should I watch for while using this medicine? Visit your doctor or health care professional for regular checks on your progress. To help reduce irritation at the injection site, use a different site for each injection and make sure the solution is at room temperature before use. This medicine may cause increases or decreases in blood sugar. Signs of high blood sugar include frequent urination, unusual thirst, flushed or dry skin, difficulty breathing, drowsiness, stomach ache, nausea, vomiting or dry mouth. Signs of low blood sugar include chills, cool, pale skin or cold sweats, drowsiness, extreme hunger, fast heartbeat, headache, nausea, nervousness or anxiety, shakiness, trembling, unsteadiness, tiredness, or weakness. Contact your doctor or health care professional right away if you experience any of these symptoms. What side effects may I notice from receiving this medicine? Side effects that you should report to your doctor or health care professional as soon as possible: -allergic reactions like skin rash, itching or hives, swelling   of the face, lips, or tongue -changes in blood sugar -changes in heart rate -severe stomach pain Side effects that  usually do not require medical attention (report to your doctor or health care professional if they continue or are bothersome): -diarrhea or constipation -gas or stomach pain -nausea, vomiting -pain, redness, swelling and irritation at site where injected This list may not describe all possible side effects. Call your doctor for medical advice about side effects. You may report side effects to FDA at 1-800-FDA-1088. Where should I keep my medicine? Keep out of the reach of children. Store in a refrigerator between 2 and 8 degrees C (36 and 46 degrees F). Protect from light. Allow to come to room temperature naturally. Do not use artificial heat. If protected from light, the injection may be stored at room temperature between 20 and 30 degrees C (70 and 86 degrees F) for 14 days. After the initial use, throw away any unused portion of a multiple dose vial after 14 days. Throw away unused portions of the ampules after use. NOTE: This sheet is a summary. It may not cover all possible information. If you have questions about this medicine, talk to your doctor, pharmacist, or health care provider.  2015, Elsevier/Gold Standard. (2008-05-14 16:56:04) Influenza Virus Vaccine injection What is this medicine? INFLUENZA VIRUS VACCINE (in floo EN zuh VAHY ruhs vak SEEN) helps to reduce the risk of getting influenza also known as the flu. The vaccine only helps protect you against some strains of the flu. This medicine may be used for other purposes; ask your health care provider or pharmacist if you have questions. COMMON BRAND NAME(S): Afluria, Agriflu, Fluarix, Fluarix Quadrivalent, FLUCELVAX, Flulaval, Fluvirin, Fluzone, Fluzone High-Dose, Fluzone Intradermal What should I tell my health care provider before I take this medicine? They need to know if you have any of these conditions: -bleeding disorder like hemophilia -fever or infection -Guillain-Barre syndrome or other neurological problems -immune  system problems -infection with the human immunodeficiency virus (HIV) or AIDS -low blood platelet counts -multiple sclerosis -an unusual or allergic reaction to influenza virus vaccine, latex, other medicines, foods, dyes, or preservatives. Different brands of vaccines contain different allergens. Some may contain latex or eggs. Talk to your doctor about your allergies to make sure that you get the right vaccine. -pregnant or trying to get pregnant -breast-feeding How should I use this medicine? This vaccine is for injection into a muscle or under the skin. It is given by a health care professional. A copy of Vaccine Information Statements will be given before each vaccination. Read this sheet carefully each time. The sheet may change frequently. Talk to your healthcare provider to see which vaccines are right for you. Some vaccines should not be used in all age groups. Overdosage: If you think you have taken too much of this medicine contact a poison control center or emergency room at once. NOTE: This medicine is only for you. Do not share this medicine with others. What if I miss a dose? This does not apply. What may interact with this medicine? -chemotherapy or radiation therapy -medicines that lower your immune system like etanercept, anakinra, infliximab, and adalimumab -medicines that treat or prevent blood clots like warfarin -phenytoin -steroid medicines like prednisone or cortisone -theophylline -vaccines This list may not describe all possible interactions. Give your health care provider a list of all the medicines, herbs, non-prescription drugs, or dietary supplements you use. Also tell them if you smoke, drink alcohol, or use illegal drugs.  Some items may interact with your medicine. What should I watch for while using this medicine? Report any side effects that do not go away within 3 days to your doctor or health care professional. Call your health care provider if any  unusual symptoms occur within 6 weeks of receiving this vaccine. You may still catch the flu, but the illness is not usually as bad. You cannot get the flu from the vaccine. The vaccine will not protect against colds or other illnesses that may cause fever. The vaccine is needed every year. What side effects may I notice from receiving this medicine? Side effects that you should report to your doctor or health care professional as soon as possible: -allergic reactions like skin rash, itching or hives, swelling of the face, lips, or tongue Side effects that usually do not require medical attention (report to your doctor or health care professional if they continue or are bothersome): -fever -headache -muscle aches and pains -pain, tenderness, redness, or swelling at the injection site -tiredness This list may not describe all possible side effects. Call your doctor for medical advice about side effects. You may report side effects to FDA at 1-800-FDA-1088. Where should I keep my medicine? The vaccine will be given by a health care professional in a clinic, pharmacy, doctor's office, or other health care setting. You will not be given vaccine doses to store at home. NOTE: This sheet is a summary. It may not cover all possible information. If you have questions about this medicine, talk to your doctor, pharmacist, or health care provider.  2015, Elsevier/Gold Standard. (2012-04-27 10:17:51)

## 2014-07-26 NOTE — Telephone Encounter (Signed)
Pt aware of 10-14 CT to drink contrast be NPO 4 hrs and RN will contact him with premeds. Lexine Baton aware). He is also aware of 10-23 inj and 11-19 MD appointments

## 2014-07-31 LAB — COMPREHENSIVE METABOLIC PANEL
ALBUMIN: 3.6 g/dL (ref 3.5–5.2)
ALK PHOS: 71 U/L (ref 39–117)
ALT: 17 U/L (ref 0–53)
AST: 25 U/L (ref 0–37)
BUN: 12 mg/dL (ref 6–23)
CALCIUM: 9.7 mg/dL (ref 8.4–10.5)
CHLORIDE: 103 meq/L (ref 96–112)
CO2: 30 mEq/L (ref 19–32)
Creatinine, Ser: 0.96 mg/dL (ref 0.50–1.35)
Glucose, Bld: 105 mg/dL — ABNORMAL HIGH (ref 70–99)
POTASSIUM: 4.8 meq/L (ref 3.5–5.3)
Sodium: 143 mEq/L (ref 135–145)
Total Bilirubin: 0.5 mg/dL (ref 0.2–1.2)
Total Protein: 7.3 g/dL (ref 6.0–8.3)

## 2014-07-31 LAB — LACTATE DEHYDROGENASE: LDH: 156 U/L (ref 94–250)

## 2014-07-31 LAB — CHROMOGRANIN A: Chromogranin A: 22 ng/mL — ABNORMAL HIGH (ref <=15)

## 2014-08-08 ENCOUNTER — Encounter: Payer: Self-pay | Admitting: Nurse Practitioner

## 2014-08-14 ENCOUNTER — Encounter (HOSPITAL_BASED_OUTPATIENT_CLINIC_OR_DEPARTMENT_OTHER): Payer: Self-pay

## 2014-08-14 ENCOUNTER — Ambulatory Visit (HOSPITAL_BASED_OUTPATIENT_CLINIC_OR_DEPARTMENT_OTHER)
Admission: RE | Admit: 2014-08-14 | Discharge: 2014-08-14 | Disposition: A | Discharge status: Home / Self Care | Payer: Medicare PPO | Source: Ambulatory Visit | Attending: Hematology & Oncology | Admitting: Hematology & Oncology

## 2014-08-14 DIAGNOSIS — C787 Secondary malignant neoplasm of liver and intrahepatic bile duct: Secondary | ICD-10-CM | POA: Diagnosis not present

## 2014-08-14 DIAGNOSIS — D499 Neoplasm of unspecified behavior of unspecified site: Secondary | ICD-10-CM

## 2014-08-14 DIAGNOSIS — C7A8 Other malignant neuroendocrine tumors: Secondary | ICD-10-CM | POA: Diagnosis present

## 2014-08-14 IMAGING — CT CT ABD-PELV W/ CM
2 of 5 series · 15 of 46 positions shown, 17 images · IV contrast (APPLIED)
Comparison: CT abdomen [DATE], CT abdomen [DATE] or all are
patent.

CLINICAL DATA: Metastatic neuroendocrine tumor with multiple liver
metastasis. Two previous treatments with radio embolization in [DATE] and [UC]. Previous treatments of the right hepatic lobe
and central liver. Left hepatic lobe treatment in [DATE].

EXAM:
CT ABDOMEN AND PELVIS WITH CONTRAST
TECHNIQUE: Multidetector CT imaging of the abdomen and pelvis was performed
using the standard protocol following bolus administration of
intravenous contrast.
CONTRAST:  100mL OMNIPAQUE IOHEXOL 300 MG/ML  SOLN

[Series 2: abd/pelvis 5.0 b31f · axial · 0.80mm/px · z∈[+608,+1043]mm · 12 of 99 slices shown, 14 images]
[im 6/99  soft-tissue]
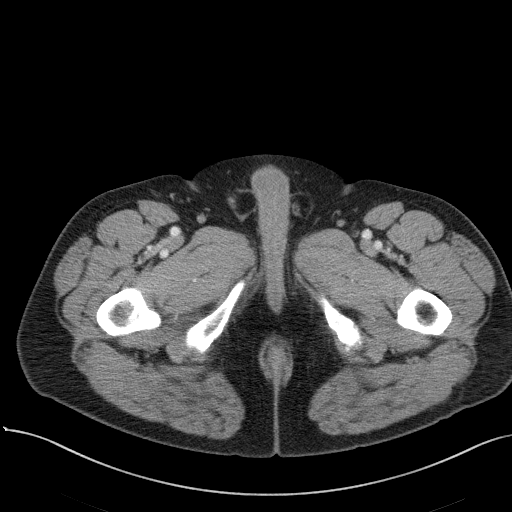
[im 6/99  bone]
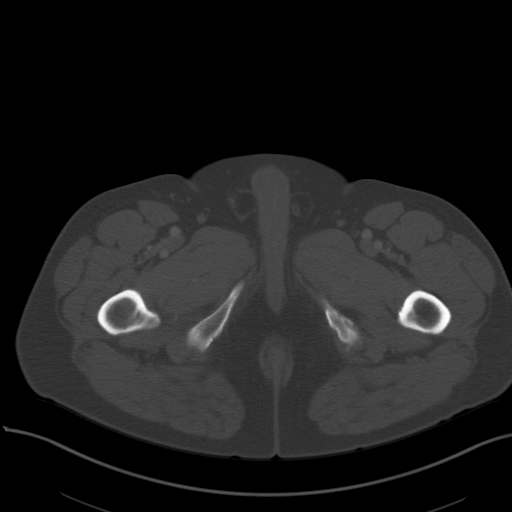
[im 16/99  soft-tissue]
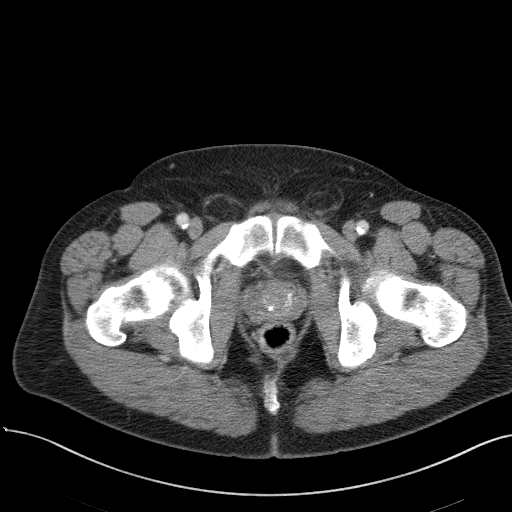
[im 21/99  soft-tissue]
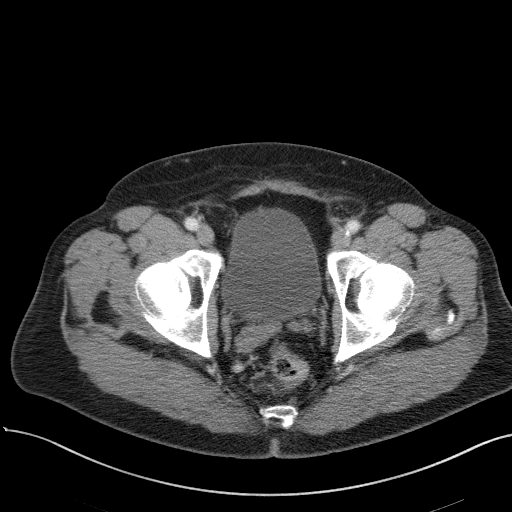
[im 31/99  soft-tissue]
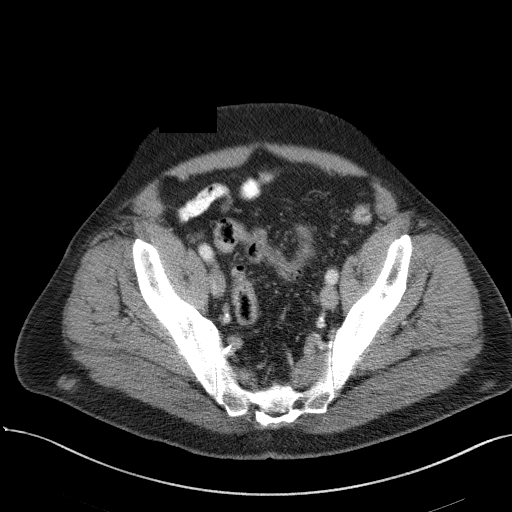
[im 37/99  soft-tissue]
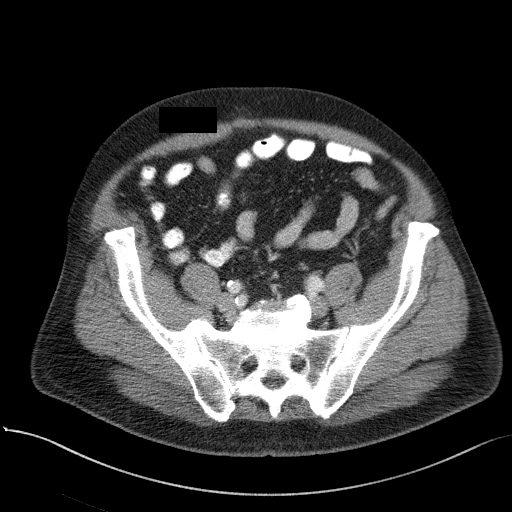
[im 47/99  soft-tissue]
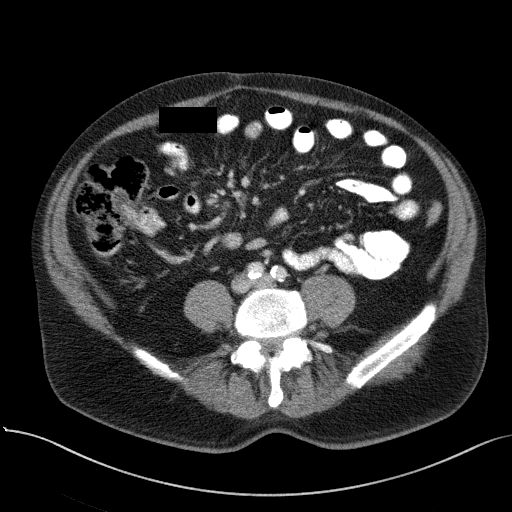
[im 52/99  soft-tissue]
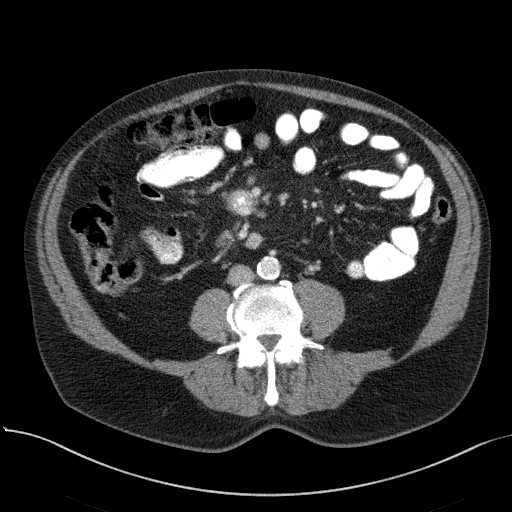
[im 62/99  soft-tissue]
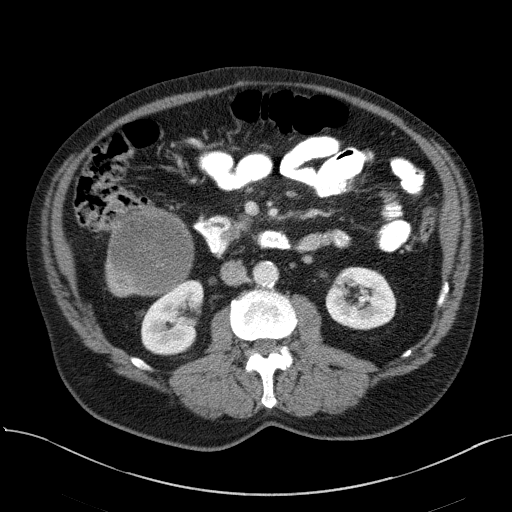
[im 68/99  soft-tissue]
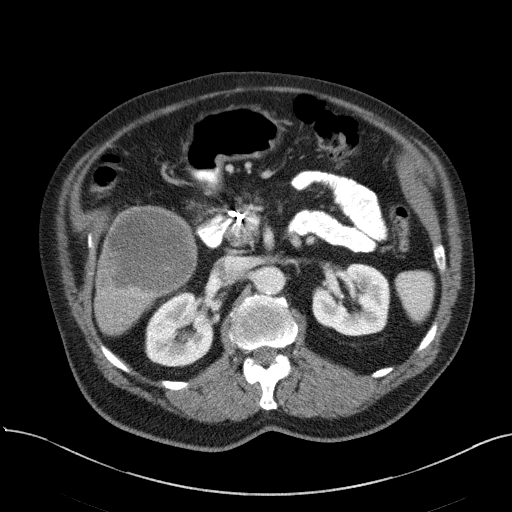
[im 68/99  bone]
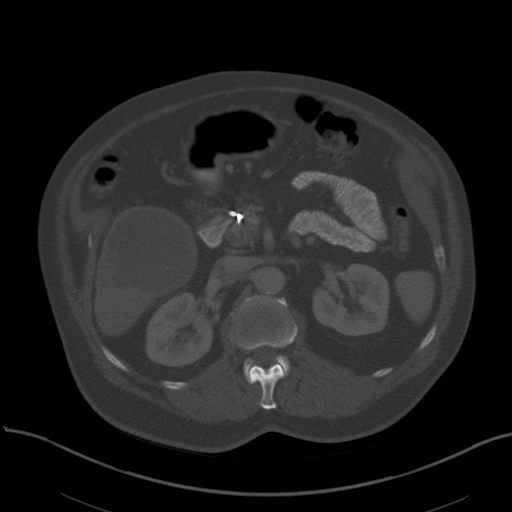
[im 78/99  soft-tissue]
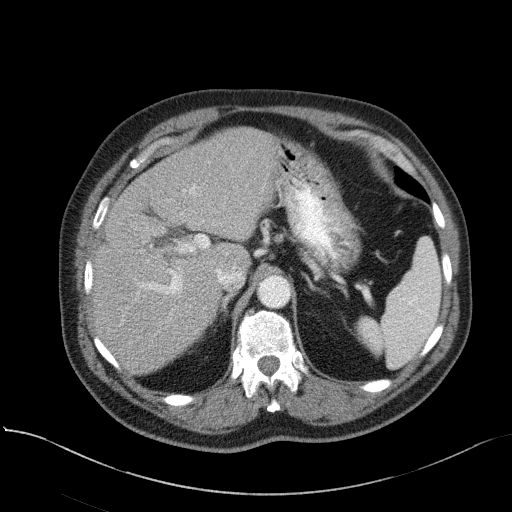
[im 83/99  soft-tissue]
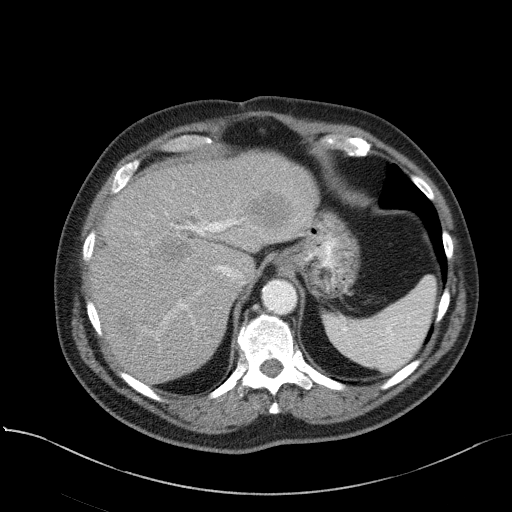
[im 93/99  soft-tissue]
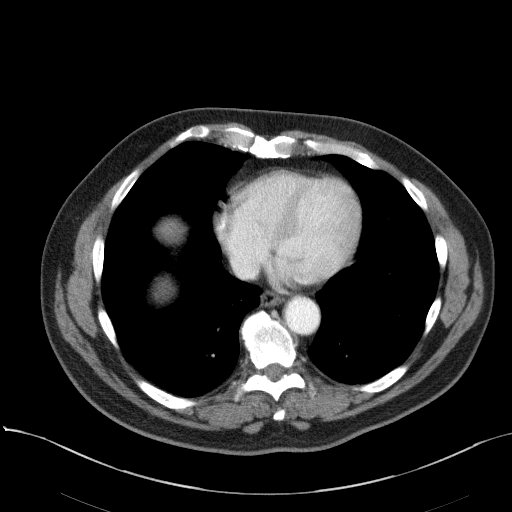

[Series 5: abd/pelvis 3.0 coronal · coronal · 0.84mm/px · 3 of 104 slices shown]
[im 35/104  soft-tissue]
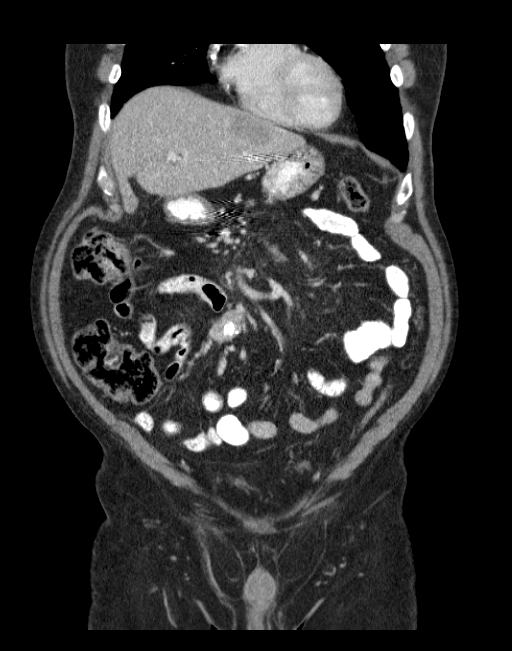
[im 46/104  soft-tissue]
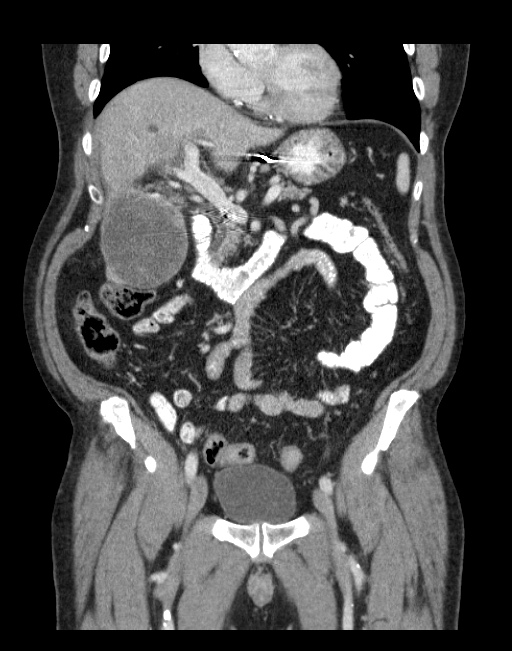
[im 58/104  soft-tissue]
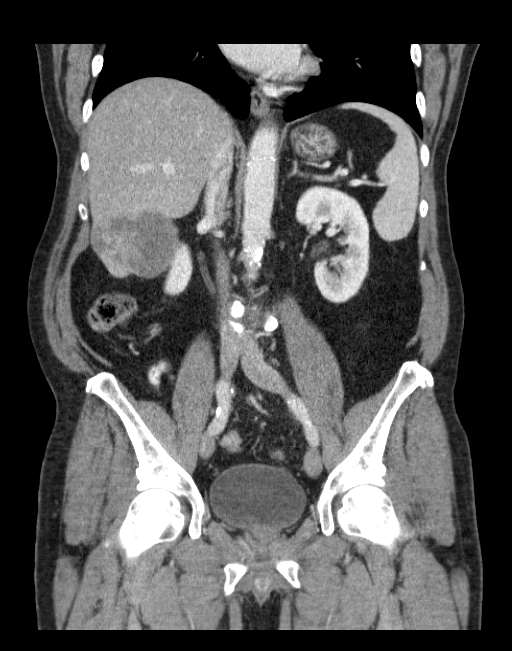

[15 of 46 positions shown; findings below may reference images not displayed]

FINDINGS: Lower chest:  Lung bases are clear.  No pericardial fluid

Hepatobiliary: Again demonstrated metastatic lesions within left and
right hepatic lobes.

Lesion within the central left hepatic lobe is slightly smaller
measuring 28 x 26 mm compared to 30 x 30 mm on prior (image 18,
series 2).

Lesion in the superior right hepatic lobe measures 20 x 21 mm
compared to 21 x 22 mm on prior for no significant change.

Lesion in the left lateral hepatic lobe measures 38 x 33 mm compared
to 39 x 30 mm on prior for no significant change.

Largest lesion inferior right hepatic lobe which is partially
exophytic measures 80 x 8 7 mm compared to 79 x 89 mm on prior for
no change. No new hepatic lesions are identified.

Pancreas: Surgical clips adjacent pancreatic head. No pancreatic
lesion or ductal dilatation.

Spleen: Normal spleen.

Adrenals/urinary tract: Adrenal glands kidneys are normal. Bladder
is normal.

Stomach/Bowel: Stomach, small bowel and colon are unremarkable.
There is a small bowel anastomosis in the mid right abdomen without
obstruction

Vascular/Lymphatic: Abdominal aorta is normal caliber. There is no
retroperitoneal or periportal lymphadenopathy. No pelvic
lymphadenopathy.

Reproductive: Prostate gland is normal.

Musculoskeletal: No aggressive osseous lesion.

Other: Central mesentery mass with partial calcification measures 28
x 27 mm compared to 23 x 27 mm for no significant change. Additional
small nodule superior to the mass is unchanged.
IMPRESSION: 1. Metastatic lesion in the central left hepatic lobe measures
slightly smaller than prior. Additional lesions in left and right
hepatic lobe are stable.
2. Stable partially calcified central mesenteric mass.
3. No evidence of peritoneal metastatic progression.

## 2014-08-14 MED ORDER — IOHEXOL 300 MG/ML  SOLN
100.0000 mL | Freq: Once | INTRAMUSCULAR | Status: AC | PRN
  Administered 2014-08-14: 100 mL via INTRAVENOUS

## 2014-08-15 ENCOUNTER — Telehealth: Payer: Self-pay | Admitting: Internal Medicine

## 2014-08-16 ENCOUNTER — Encounter: Payer: Self-pay | Admitting: *Deleted

## 2014-08-20 NOTE — Telephone Encounter (Signed)
Left message for pt to call back  °

## 2014-08-21 NOTE — Telephone Encounter (Signed)
Pt states he has external hemorrhoids and wanted to know if these could be banded. Discussed with pt that externa hemorrhoids could not be banded, only internal and that he would need to see a surgeon for external removal. Pt stated that he had done that in the past and he did not want to go through that again. Pt instructed to call back if he had any further questions.

## 2014-08-21 NOTE — Telephone Encounter (Signed)
Left message for pt to call back  °

## 2014-08-22 ENCOUNTER — Ambulatory Visit (HOSPITAL_BASED_OUTPATIENT_CLINIC_OR_DEPARTMENT_OTHER): Payer: Medicare PPO

## 2014-08-22 VITALS — BP 141/82 | HR 82 | Temp 98.2°F | Resp 18

## 2014-08-22 DIAGNOSIS — D499 Neoplasm of unspecified behavior of unspecified site: Secondary | ICD-10-CM

## 2014-08-22 DIAGNOSIS — C7A00 Malignant carcinoid tumor of unspecified site: Secondary | ICD-10-CM

## 2014-08-22 DIAGNOSIS — E340 Carcinoid syndrome: Secondary | ICD-10-CM

## 2014-08-22 DIAGNOSIS — C7B02 Secondary carcinoid tumors of liver: Secondary | ICD-10-CM

## 2014-08-22 MED ORDER — OCTREOTIDE ACETATE 30 MG IM KIT
30.0000 mg | PACK | Freq: Once | INTRAMUSCULAR | Status: AC
  Administered 2014-08-22: 30 mg via INTRAMUSCULAR

## 2014-08-22 NOTE — Patient Instructions (Signed)

## 2014-08-23 ENCOUNTER — Ambulatory Visit: Payer: Medicare PPO

## 2014-09-19 ENCOUNTER — Ambulatory Visit (HOSPITAL_BASED_OUTPATIENT_CLINIC_OR_DEPARTMENT_OTHER): Payer: Medicare PPO | Admitting: Hematology & Oncology

## 2014-09-19 ENCOUNTER — Ambulatory Visit (HOSPITAL_BASED_OUTPATIENT_CLINIC_OR_DEPARTMENT_OTHER): Payer: Medicare PPO

## 2014-09-19 ENCOUNTER — Encounter: Payer: Self-pay | Admitting: Lab

## 2014-09-19 ENCOUNTER — Ambulatory Visit (HOSPITAL_BASED_OUTPATIENT_CLINIC_OR_DEPARTMENT_OTHER): Payer: Medicare PPO | Admitting: Lab

## 2014-09-19 DIAGNOSIS — C7B02 Secondary carcinoid tumors of liver: Secondary | ICD-10-CM

## 2014-09-19 DIAGNOSIS — C7A8 Other malignant neuroendocrine tumors: Secondary | ICD-10-CM | POA: Diagnosis not present

## 2014-09-19 DIAGNOSIS — E340 Carcinoid syndrome: Secondary | ICD-10-CM

## 2014-09-19 DIAGNOSIS — D499 Neoplasm of unspecified behavior of unspecified site: Secondary | ICD-10-CM

## 2014-09-19 LAB — CBC WITH DIFFERENTIAL (CANCER CENTER ONLY)
BASO#: 0 10*3/uL (ref 0.0–0.2)
BASO%: 0.3 % (ref 0.0–2.0)
EOS%: 6.1 % (ref 0.0–7.0)
Eosinophils Absolute: 0.4 10*3/uL (ref 0.0–0.5)
HCT: 40.5 % (ref 38.7–49.9)
HGB: 13.2 g/dL (ref 13.0–17.1)
LYMPH#: 0.9 10*3/uL (ref 0.9–3.3)
LYMPH%: 15.1 % (ref 14.0–48.0)
MCH: 28.2 pg (ref 28.0–33.4)
MCHC: 32.6 g/dL (ref 32.0–35.9)
MCV: 87 fL (ref 82–98)
MONO#: 0.5 10*3/uL (ref 0.1–0.9)
MONO%: 8.7 % (ref 0.0–13.0)
NEUT#: 4.2 10*3/uL (ref 1.5–6.5)
NEUT%: 69.8 % (ref 40.0–80.0)
PLATELETS: 154 10*3/uL (ref 145–400)
RBC: 4.68 10*6/uL (ref 4.20–5.70)
RDW: 14.6 % (ref 11.1–15.7)
WBC: 6 10*3/uL (ref 4.0–10.0)

## 2014-09-19 MED ORDER — OCTREOTIDE ACETATE 30 MG IM KIT
30.0000 mg | PACK | Freq: Once | INTRAMUSCULAR | Status: AC
  Administered 2014-09-19: 30 mg via INTRAMUSCULAR

## 2014-09-19 NOTE — Progress Notes (Signed)
Hematology and Oncology Follow Up Visit  Brian Mays [id number] [birth year] 68 y.o. 09/19/2014   Principle Diagnosis:  Metastatic low grade neuroendocrine tumor-hepatic metastases   Current Therapy:    S/p second yttrium-90 intrahepatic therapy  #2 Sandostatin LAR 30 mg IM every month     Interim History:  [name] is back for followup. He is doing quite well. He is still working. He is only working part-time.  He's had no problems with bowels or bladder. He did have 1 episode of diarrhea.  He's had no abdominal pain. He's had no nausea or vomiting.  We did go ahead and repeat a CT scan on him. This is done in mid October. This did show slight improvement in the hepatic lesion in the left hepatic lobe. The other hepatic lesions are stable. His mesenteric mass is calcified and stable.  His last chromogranin A was stable at 22.  He's had no rashes. He's had no leg swelling. He's had no headache. There's been no visual issues. He's had no wheezing. He's had no flushing.  Medications: Current outpatient prescriptions: amLODipine (NORVASC) 5 MG tablet, Take 5 mg by mouth every morning. , Disp: , Rfl: ;  aspirin EC 81 MG tablet, Take 81 mg by mouth every morning., Disp: , Rfl: ;  Cholecalciferol 1000 UNITS tablet, Take 1,000 Units by mouth daily.  , Disp: , Rfl: ;  hydrocortisone (ANUSOL-HC) 25 MG suppository, Place 25 mg rectally 2 (two) times daily as needed for hemorrhoids or itching., Disp: , Rfl:  lansoprazole (PREVACID) 30 MG capsule, Take 30 mg by mouth daily at 12 noon., Disp: , Rfl: ;  levothyroxine (SYNTHROID, LEVOTHROID) 50 MCG tablet, Take 50 mcg by mouth daily before breakfast., Disp: , Rfl: ;  lipase/protease/amylase (CREON-12/PANCREASE) 12000 UNITS CPEP capsule, Take 1 capsule by mouth 3 (three) times daily before meals., Disp: , Rfl:  losartan (COZAAR) 100 MG tablet, Take 1 tablet (100 mg total) by mouth every morning., Disp: 90 tablet, Rfl: 3;  Octreotide Acetate  (SANDOSTATIN IJ), Inject 1 application as directed every 28 (twenty-eight) days. Pt is followed by Dr. Marin Olp office, Disp: , Rfl: ;  PARoxetine (PAXIL) 10 MG tablet, TAKE 1 TABLET BY MOUTH EVERY DAY, Disp: 30 tablet, Rfl: 3 sodium chloride (OCEAN) 0.65 % SOLN nasal spray, Place 1 spray into both nostrils as needed for congestion. , Disp: , Rfl: ;  vitamin B-12 (CYANOCOBALAMIN) 500 MCG tablet, Take 500 mcg by mouth 2 (two) times daily., Disp: , Rfl:   Allergies:  Allergies  Allergen Reactions  . Iohexol      Code: HIVES, Desc: PER [name] @ [facility], PT IS ALLERGIC TO CONTRAST DYE 10/02/08/RM  05/01/10...needs full premeds per our protocol w/ [facility]., Onset Date: [id number]     Past Medical History, Surgical history, Social history, and Family History were reviewed and updated.  Review of Systems: As above  Physical Exam:  vitals were not taken for this visit.  Well-developed and well-nourished white gentleman. Head and exam shows no ocular or oral lesions. There are no palpable cervical or supraclavicular lymph nodes. Thyroid is not palpable. Lungs are clear. Cardiac exam regular rate and rhythm with a normal S1 and S2. He has no murmurs, rubs or bruits.. Abdomen is soft. He has a well-healed laparotomy scar. There is no fluid wave. There is no guarding. He is no palpable liver or spleen tip. Back exam shows no tenderness over the spine, ribs or hips. Extremities shows no clubbing, cyanosis  or edema. Skin exam no rashes. Neurological exam is nonfocal. Lab Results  Component Value Date   WBC 6.0 09/19/2014   HGB 13.2 09/19/2014   HCT 40.5 09/19/2014   MCV 87 09/19/2014   PLT 154 09/19/2014     Chemistry      Component Value Date/Time   NA 143 07/26/2014 0908   NA 141 05/31/2014 0928   NA 141 03/19/2014 1136   K 4.8 07/26/2014 0908   K 3.5 05/31/2014 0928   K 3.9 03/19/2014 1136   CL 103 07/26/2014 0908   CL 102 05/31/2014 0928   CO2 30 07/26/2014 0908   CO2 30 05/31/2014  0928   CO2 25 03/19/2014 1136   BUN 12 07/26/2014 0908   BUN 13 05/31/2014 0928   BUN 13.8 03/19/2014 1136   CREATININE 0.96 07/26/2014 0908   CREATININE 0.7 05/31/2014 0928   CREATININE 0.9 03/19/2014 1136      Component Value Date/Time   CALCIUM 9.7 07/26/2014 0908   CALCIUM 8.8 05/31/2014 0928   CALCIUM 9.4 03/19/2014 1136   ALKPHOS 71 07/26/2014 0908   ALKPHOS 78 05/31/2014 0928   ALKPHOS 71 03/19/2014 1136   AST 25 07/26/2014 0908   AST 34 05/31/2014 0928   AST 30 03/19/2014 1136   ALT 17 07/26/2014 0908   ALT 26 05/31/2014 0928   ALT 24 03/19/2014 1136   BILITOT 0.5 07/26/2014 0908   BILITOT 0.60 05/31/2014 0928   BILITOT 0.69 03/19/2014 1136         Impression and Plan: Brian Mays is 68 year old gentleman with a neuroendocrine carcinoma. He has liver metastases. He had a second intrahepatic therapy with yttrium-90. His CT scan shows stable disease.  He will continue on the octreotide. He's doing well with this.  He is a good quality of life. He's doing what he pretty much likes to do.  I'll plan to get him back in one month just for his Sandostatin injection.  I'll see him back in 2 months.   Brian Napoleon, MD 11/19/20156:16 PM

## 2014-09-19 NOTE — Patient Instructions (Signed)

## 2014-09-20 ENCOUNTER — Telehealth: Payer: Self-pay | Admitting: Hematology & Oncology

## 2014-09-20 NOTE — Telephone Encounter (Signed)
[facility]: [id number] Status: Approved  Dates: 09/19/2014-12/18/2014 CPT: S1115

## 2014-09-25 LAB — COMPREHENSIVE METABOLIC PANEL WITH GFR
ALT: 20 U/L (ref 0–53)
AST: 26 U/L (ref 0–37)
Albumin: 3.6 g/dL (ref 3.5–5.2)
Alkaline Phosphatase: 70 U/L (ref 39–117)
BUN: 12 mg/dL (ref 6–23)
CO2: 26 meq/L (ref 19–32)
Calcium: 9.1 mg/dL (ref 8.4–10.5)
Chloride: 104 meq/L (ref 96–112)
Creatinine, Ser: 0.78 mg/dL (ref 0.50–1.35)
Glucose, Bld: 104 mg/dL — ABNORMAL HIGH (ref 70–99)
Potassium: 4.1 meq/L (ref 3.5–5.3)
Sodium: 140 meq/L (ref 135–145)
Total Bilirubin: 0.5 mg/dL (ref 0.2–1.2)
Total Protein: 6.7 g/dL (ref 6.0–8.3)

## 2014-09-25 LAB — LACTATE DEHYDROGENASE: LDH: 161 U/L (ref 94–250)

## 2014-09-25 LAB — CHROMOGRANIN A: CHROMOGRANIN A: 17 ng/mL — AB (ref <=15)

## 2014-10-17 ENCOUNTER — Ambulatory Visit: Payer: Medicare PPO

## 2014-10-18 ENCOUNTER — Ambulatory Visit (HOSPITAL_BASED_OUTPATIENT_CLINIC_OR_DEPARTMENT_OTHER): Payer: Medicare PPO

## 2014-10-18 VITALS — BP 134/83 | HR 68 | Temp 98.1°F | Resp 16

## 2014-10-18 DIAGNOSIS — C7A8 Other malignant neuroendocrine tumors: Secondary | ICD-10-CM | POA: Diagnosis not present

## 2014-10-18 DIAGNOSIS — E340 Carcinoid syndrome: Secondary | ICD-10-CM

## 2014-10-18 DIAGNOSIS — D499 Neoplasm of unspecified behavior of unspecified site: Secondary | ICD-10-CM

## 2014-10-18 MED ORDER — OCTREOTIDE ACETATE 30 MG IM KIT
PACK | INTRAMUSCULAR | Status: AC
  Filled 2014-10-18: qty 1

## 2014-10-18 MED ORDER — OCTREOTIDE ACETATE 30 MG IM KIT
30.0000 mg | PACK | Freq: Once | INTRAMUSCULAR | Status: AC
  Administered 2014-10-18: 30 mg via INTRAMUSCULAR

## 2014-10-18 NOTE — Patient Instructions (Signed)

## 2014-11-13 ENCOUNTER — Other Ambulatory Visit: Payer: Self-pay | Admitting: *Deleted

## 2014-11-13 DIAGNOSIS — D499 Neoplasm of unspecified behavior of unspecified site: Secondary | ICD-10-CM

## 2014-11-13 MED ORDER — PAROXETINE HCL 10 MG PO TABS: 10.0000 mg | ORAL_TABLET | Freq: Every day | ORAL | Status: DC

## 2014-11-15 ENCOUNTER — Encounter: Payer: Self-pay | Admitting: Hematology & Oncology

## 2014-11-15 ENCOUNTER — Ambulatory Visit (HOSPITAL_BASED_OUTPATIENT_CLINIC_OR_DEPARTMENT_OTHER): Payer: Medicare PPO

## 2014-11-15 ENCOUNTER — Ambulatory Visit (HOSPITAL_BASED_OUTPATIENT_CLINIC_OR_DEPARTMENT_OTHER): Payer: Medicare PPO | Admitting: Hematology & Oncology

## 2014-11-15 ENCOUNTER — Other Ambulatory Visit (HOSPITAL_BASED_OUTPATIENT_CLINIC_OR_DEPARTMENT_OTHER): Payer: Medicare PPO | Admitting: Lab

## 2014-11-15 VITALS — BP 139/86 | HR 71 | Temp 98.2°F | Resp 18 | Ht 70.0 in | Wt 214.0 lb

## 2014-11-15 DIAGNOSIS — C7B8 Other secondary neuroendocrine tumors: Secondary | ICD-10-CM

## 2014-11-15 DIAGNOSIS — D499 Neoplasm of unspecified behavior of unspecified site: Secondary | ICD-10-CM

## 2014-11-15 DIAGNOSIS — E340 Carcinoid syndrome: Secondary | ICD-10-CM | POA: Diagnosis not present

## 2014-11-15 DIAGNOSIS — C7A8 Other malignant neuroendocrine tumors: Secondary | ICD-10-CM | POA: Diagnosis not present

## 2014-11-15 DIAGNOSIS — D3A8 Other benign neuroendocrine tumors: Secondary | ICD-10-CM

## 2014-11-15 LAB — CBC WITH DIFFERENTIAL (CANCER CENTER ONLY)
BASO#: 0 10*3/uL (ref 0.0–0.2)
BASO%: 0.4 % (ref 0.0–2.0)
EOS%: 4.9 % (ref 0.0–7.0)
Eosinophils Absolute: 0.3 10*3/uL (ref 0.0–0.5)
HCT: 41.9 % (ref 38.7–49.9)
HEMOGLOBIN: 13.7 g/dL (ref 13.0–17.1)
LYMPH#: 1 10*3/uL (ref 0.9–3.3)
LYMPH%: 15 % (ref 14.0–48.0)
MCH: 28.1 pg (ref 28.0–33.4)
MCHC: 32.7 g/dL (ref 32.0–35.9)
MCV: 86 fL (ref 82–98)
MONO#: 0.5 10*3/uL (ref 0.1–0.9)
MONO%: 7.5 % (ref 0.0–13.0)
NEUT#: 5 10*3/uL (ref 1.5–6.5)
NEUT%: 72.2 % (ref 40.0–80.0)
PLATELETS: 179 10*3/uL (ref 145–400)
RBC: 4.88 10*6/uL (ref 4.20–5.70)
RDW: 14.6 % (ref 11.1–15.7)
WBC: 6.9 10*3/uL (ref 4.0–10.0)

## 2014-11-15 LAB — CMP (CANCER CENTER ONLY)
ALT(SGPT): 23 U/L (ref 10–47)
AST: 27 U/L (ref 11–38)
Albumin: 3.3 g/dL (ref 3.3–5.5)
Alkaline Phosphatase: 62 U/L (ref 26–84)
BUN, Bld: 16 mg/dL (ref 7–22)
CO2: 29 meq/L (ref 18–33)
CREATININE: 0.9 mg/dL (ref 0.6–1.2)
Calcium: 9.5 mg/dL (ref 8.0–10.3)
Chloride: 99 mEq/L (ref 98–108)
GLUCOSE: 104 mg/dL (ref 73–118)
POTASSIUM: 4 meq/L (ref 3.3–4.7)
Sodium: 142 mEq/L (ref 128–145)
Total Bilirubin: 0.6 mg/dl (ref 0.20–1.60)
Total Protein: 8 g/dL (ref 6.4–8.1)

## 2014-11-15 MED ORDER — OCTREOTIDE ACETATE 30 MG IM KIT
30.0000 mg | PACK | Freq: Once | INTRAMUSCULAR | Status: AC
Start: 2014-11-15 — End: 2014-11-15
  Administered 2014-11-15: 30 mg via INTRAMUSCULAR

## 2014-11-15 MED ORDER — OCTREOTIDE ACETATE 30 MG IM KIT
PACK | INTRAMUSCULAR | Status: AC
  Filled 2014-11-15: qty 1

## 2014-11-15 NOTE — Patient Instructions (Signed)

## 2014-11-15 NOTE — Progress Notes (Signed)
Hematology and Oncology Follow Up Visit  Brian Mays [id number] [birth year] 69 y.o. 11/15/2014   Principle Diagnosis:  Metastatic low grade neuroendocrine tumor-hepatic metastases   Current Therapy:    S/p second yttrium-90 intrahepatic therapy  #2 Sandostatin LAR 30 mg IM every month     Interim History:  [name] is back for followup. He is doing quite well. He is still working. He is only working part-time.  He is fairly had a good Christmas holiday. They did not go anywhere.  He ate quite well over Christmas. He may have gained a little bit of weight.  He's had no abdominal pain. There's been no diarrhea. He's had no cough. He's had no rashes. He's had no fever.  His last chromogranin A was stable at 17  Medications:  Current outpatient prescriptions:  .  amLODipine (NORVASC) 5 MG tablet, Take 5 mg by mouth every morning. , Disp: , Rfl:  .  aspirin EC 81 MG tablet, Take 81 mg by mouth every morning., Disp: , Rfl:  .  Cholecalciferol 1000 UNITS tablet, Take 1,000 Units by mouth daily.  , Disp: , Rfl:  .  hydrocortisone (ANUSOL-HC) 25 MG suppository, Place 25 mg rectally 2 (two) times daily as needed for hemorrhoids or itching., Disp: , Rfl:  .  lansoprazole (PREVACID) 30 MG capsule, Take 30 mg by mouth daily at 12 noon., Disp: , Rfl:  .  levothyroxine (SYNTHROID, LEVOTHROID) 50 MCG tablet, Take 50 mcg by mouth daily before breakfast., Disp: , Rfl:  .  lipase/protease/amylase (CREON-12/PANCREASE) 12000 UNITS CPEP capsule, Take 1 capsule by mouth 3 (three) times daily before meals., Disp: , Rfl:  .  losartan (COZAAR) 100 MG tablet, Take 1 tablet (100 mg total) by mouth every morning., Disp: 90 tablet, Rfl: 3 .  Octreotide Acetate (SANDOSTATIN IJ), Inject 1 application as directed every 28 (twenty-eight) days. Pt is followed by Dr. Marin Olp office, Disp: , Rfl:  .  PARoxetine (PAXIL) 10 MG tablet, Take 1 tablet (10 mg total) by mouth daily., Disp: 30 tablet, Rfl: 3 .  sodium  chloride (OCEAN) 0.65 % SOLN nasal spray, Place 1 spray into both nostrils as needed for congestion. , Disp: , Rfl:  .  vitamin B-12 (CYANOCOBALAMIN) 500 MCG tablet, Take 500 mcg by mouth 2 (two) times daily., Disp: , Rfl:   Allergies:  Allergies  Allergen Reactions  . Iohexol      Code: HIVES, Desc: PER [name] @ [facility], PT IS ALLERGIC TO CONTRAST DYE 10/02/08/RM  05/01/10...needs full premeds per our protocol w/ [facility]., Onset Date: [id number]     Past Medical History, Surgical history, Social history, and Family History were reviewed and updated.  Review of Systems: As above  Physical Exam:  height is 5\' 10"  (1.778 m) and weight is 214 lb (97.07 kg). His oral temperature is 98.2 F (36.8 C). His blood pressure is 139/86 and his pulse is 71. His respiration is 18.   Well-developed and well-nourished white gentleman. Head and exam shows no ocular or oral lesions. There are no palpable cervical or supraclavicular lymph nodes. Thyroid is not palpable. Lungs are clear. Cardiac exam regular rate and rhythm with a normal S1 and S2. He has no murmurs, rubs or bruits.. Abdomen is soft. He has a well-healed laparotomy scar. There is no fluid wave. There is no guarding. He is no palpable liver or spleen tip. Back exam shows no tenderness over the spine, ribs or hips. Extremities shows no clubbing,  cyanosis or edema. Skin exam no rashes. Neurological exam is nonfocal. Lab Results  Component Value Date   WBC 6.9 11/15/2014   HGB 13.7 11/15/2014   HCT 41.9 11/15/2014   MCV 86 11/15/2014   PLT 179 11/15/2014     Chemistry      Component Value Date/Time   NA 142 11/15/2014 1319   NA 140 09/19/2014 1317   NA 141 03/19/2014 1136   K 4.0 11/15/2014 1319   K 4.1 09/19/2014 1317   K 3.9 03/19/2014 1136   CL 99 11/15/2014 1319   CL 104 09/19/2014 1317   CO2 29 11/15/2014 1319   CO2 26 09/19/2014 1317   CO2 25 03/19/2014 1136   BUN 16 11/15/2014 1319   BUN 12 09/19/2014 1317   BUN 13.8  03/19/2014 1136   CREATININE 0.9 11/15/2014 1319   CREATININE 0.78 09/19/2014 1317   CREATININE 0.9 03/19/2014 1136      Component Value Date/Time   CALCIUM 9.5 11/15/2014 1319   CALCIUM 9.1 09/19/2014 1317   CALCIUM 9.4 03/19/2014 1136   ALKPHOS 62 11/15/2014 1319   ALKPHOS 70 09/19/2014 1317   ALKPHOS 71 03/19/2014 1136   AST 27 11/15/2014 1319   AST 26 09/19/2014 1317   AST 30 03/19/2014 1136   ALT 23 11/15/2014 1319   ALT 20 09/19/2014 1317   ALT 24 03/19/2014 1136   BILITOT 0.60 11/15/2014 1319   BILITOT 0.5 09/19/2014 1317   BILITOT 0.69 03/19/2014 1136         Impression and Plan: Brian Mays is 69 year old gentleman with a neuroendocrine carcinoma. He has liver metastases. He had a second intrahepatic therapy with yttrium-90.   He will continue on the octreotide. He's doing well with this.  He is a good quality of life. He's doing what he pretty much likes to do.  I'll plan to get him back in one month just for his Sandostatin injection.  I'll see him back in 2 months.  I will set him up with a CT scan was see him back.   Volanda Napoleon, MD 1/15/20162:25 PM

## 2014-11-21 ENCOUNTER — Other Ambulatory Visit: Payer: Self-pay | Admitting: Hematology & Oncology

## 2014-11-21 LAB — CHROMOGRANIN A: Chromogranin A: 19 ng/mL — ABNORMAL HIGH (ref <=15)

## 2014-12-03 ENCOUNTER — Other Ambulatory Visit: Payer: Self-pay | Admitting: Hematology & Oncology

## 2014-12-11 ENCOUNTER — Other Ambulatory Visit: Payer: Self-pay | Admitting: Internal Medicine

## 2014-12-13 ENCOUNTER — Ambulatory Visit (HOSPITAL_BASED_OUTPATIENT_CLINIC_OR_DEPARTMENT_OTHER): Payer: Medicare PPO

## 2014-12-13 ENCOUNTER — Telehealth: Payer: Self-pay | Admitting: Hematology & Oncology

## 2014-12-13 DIAGNOSIS — C7B8 Other secondary neuroendocrine tumors: Secondary | ICD-10-CM

## 2014-12-13 DIAGNOSIS — D499 Neoplasm of unspecified behavior of unspecified site: Secondary | ICD-10-CM

## 2014-12-13 DIAGNOSIS — C7A8 Other malignant neuroendocrine tumors: Secondary | ICD-10-CM

## 2014-12-13 DIAGNOSIS — E340 Carcinoid syndrome: Secondary | ICD-10-CM

## 2014-12-13 MED ORDER — OCTREOTIDE ACETATE 30 MG IM KIT
30.0000 mg | PACK | Freq: Once | INTRAMUSCULAR | Status: AC
  Administered 2014-12-13: 30 mg via INTRAMUSCULAR

## 2014-12-13 NOTE — Telephone Encounter (Signed)
[place]: [id number] Status: Approved  Dates: 01/17/2015-04/17/2015 CPT: X2158

## 2014-12-13 NOTE — Patient Instructions (Signed)

## 2014-12-20 ENCOUNTER — Telehealth: Payer: Self-pay | Admitting: Nurse Practitioner

## 2014-12-20 NOTE — Telephone Encounter (Signed)
Paper work completed for [employer] assistance as patient requested. Papers given to [facility] via Denice Paradise.

## 2015-01-10 ENCOUNTER — Other Ambulatory Visit: Payer: Self-pay | Admitting: *Deleted

## 2015-01-10 DIAGNOSIS — D499 Neoplasm of unspecified behavior of unspecified site: Secondary | ICD-10-CM

## 2015-01-10 MED ORDER — DIPHENHYDRAMINE HCL 50 MG PO TABS: 50.0000 mg | ORAL_TABLET | Freq: Once | ORAL | Status: DC

## 2015-01-10 MED ORDER — PREDNISONE 50 MG PO TABS: ORAL_TABLET | ORAL | Status: DC

## 2015-01-13 ENCOUNTER — Other Ambulatory Visit (HOSPITAL_BASED_OUTPATIENT_CLINIC_OR_DEPARTMENT_OTHER): Payer: Medicare PPO | Admitting: Lab

## 2015-01-13 ENCOUNTER — Ambulatory Visit (HOSPITAL_BASED_OUTPATIENT_CLINIC_OR_DEPARTMENT_OTHER)
Admission: RE | Admit: 2015-01-13 | Discharge: 2015-01-13 | Disposition: A | Discharge status: Home / Self Care | Payer: Medicare PPO | Source: Ambulatory Visit | Attending: Hematology & Oncology | Admitting: Hematology & Oncology

## 2015-01-13 DIAGNOSIS — D489 Neoplasm of uncertain behavior, unspecified: Secondary | ICD-10-CM | POA: Diagnosis present

## 2015-01-13 DIAGNOSIS — R1909 Other intra-abdominal and pelvic swelling, mass and lump: Secondary | ICD-10-CM | POA: Insufficient documentation

## 2015-01-13 DIAGNOSIS — C787 Secondary malignant neoplasm of liver and intrahepatic bile duct: Secondary | ICD-10-CM | POA: Diagnosis not present

## 2015-01-13 DIAGNOSIS — C7B8 Other secondary neuroendocrine tumors: Secondary | ICD-10-CM

## 2015-01-13 DIAGNOSIS — D3A8 Other benign neuroendocrine tumors: Secondary | ICD-10-CM

## 2015-01-13 DIAGNOSIS — C7A8 Other malignant neuroendocrine tumors: Secondary | ICD-10-CM

## 2015-01-13 LAB — CBC WITH DIFFERENTIAL (CANCER CENTER ONLY)
BASO#: 0 10*3/uL (ref 0.0–0.2)
BASO%: 0.1 % (ref 0.0–2.0)
EOS ABS: 0 10*3/uL (ref 0.0–0.5)
EOS%: 0 % (ref 0.0–7.0)
HEMATOCRIT: 44.8 % (ref 38.7–49.9)
HGB: 14.7 g/dL (ref 13.0–17.1)
LYMPH#: 0.7 10*3/uL — ABNORMAL LOW (ref 0.9–3.3)
LYMPH%: 8.5 % — AB (ref 14.0–48.0)
MCH: 28.2 pg (ref 28.0–33.4)
MCHC: 32.8 g/dL (ref 32.0–35.9)
MCV: 86 fL (ref 82–98)
MONO#: 0.1 10*3/uL (ref 0.1–0.9)
MONO%: 0.9 % (ref 0.0–13.0)
NEUT#: 7.1 10*3/uL — ABNORMAL HIGH (ref 1.5–6.5)
NEUT%: 90.5 % — AB (ref 40.0–80.0)
Platelets: 187 10*3/uL (ref 145–400)
RBC: 5.22 10*6/uL (ref 4.20–5.70)
RDW: 14.4 % (ref 11.1–15.7)
WBC: 7.8 10*3/uL (ref 4.0–10.0)

## 2015-01-13 LAB — CMP (CANCER CENTER ONLY)
ALBUMIN: 3.6 g/dL (ref 3.3–5.5)
ALK PHOS: 70 U/L (ref 26–84)
ALT(SGPT): 19 U/L (ref 10–47)
AST: 35 U/L (ref 11–38)
BUN, Bld: 13 mg/dL (ref 7–22)
CO2: 30 mEq/L (ref 18–33)
Calcium: 9.6 mg/dL (ref 8.0–10.3)
Chloride: 103 mEq/L (ref 98–108)
Creat: 0.7 mg/dl (ref 0.6–1.2)
GLUCOSE: 158 mg/dL — AB (ref 73–118)
POTASSIUM: 4.3 meq/L (ref 3.3–4.7)
Sodium: 143 mEq/L (ref 128–145)
TOTAL PROTEIN: 9 g/dL — AB (ref 6.4–8.1)
Total Bilirubin: 0.8 mg/dl (ref 0.20–1.60)

## 2015-01-13 IMAGING — CT CT CHEST W/ CM
3 of 5 series · 7 of 46 positions shown, 13 images · IV contrast (omnipaque)
Comparison: [DATE]

CLINICAL DATA: Neuroendocrine carcinoma with liver metastasis.

EXAM:
CT CHEST, ABDOMEN, AND PELVIS WITH CONTRAST
TECHNIQUE: Multidetector CT imaging of the chest, abdomen and pelvis was
performed following the standard protocol during bolus
administration of intravenous contrast.
CONTRAST:  100mL OMNIPAQUE IOHEXOL 300 MG/ML  SOLN

[Series 5: chest/abd/pel 3.0 coronal · coronal · 0.79mm/px · 3 of 101 slices shown, 4 images]
[im 34/101  soft-tissue]
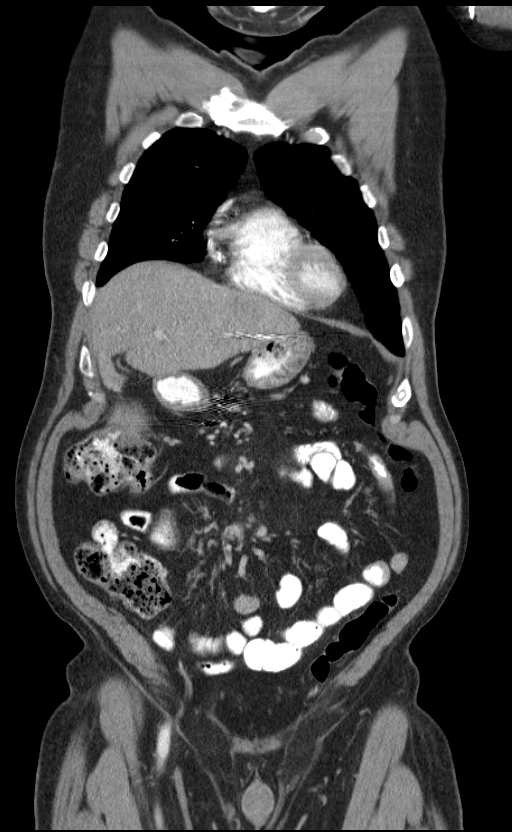
[im 45/101  soft-tissue]
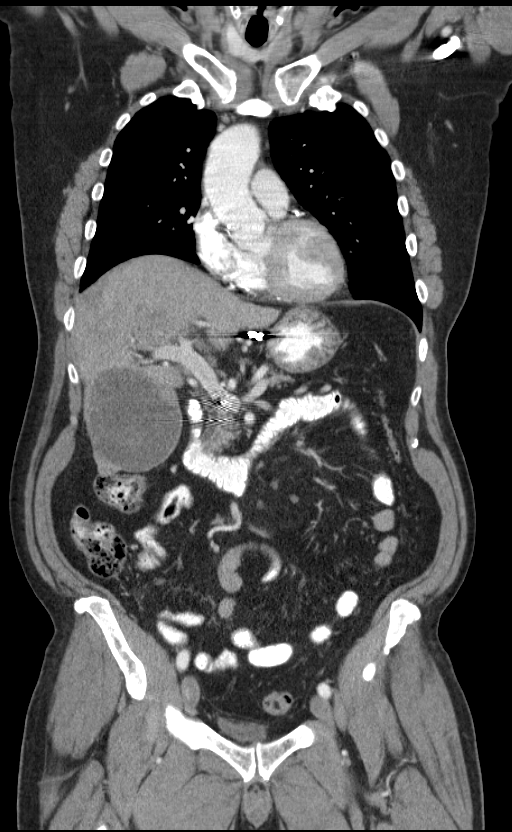
[im 45/101  bone]
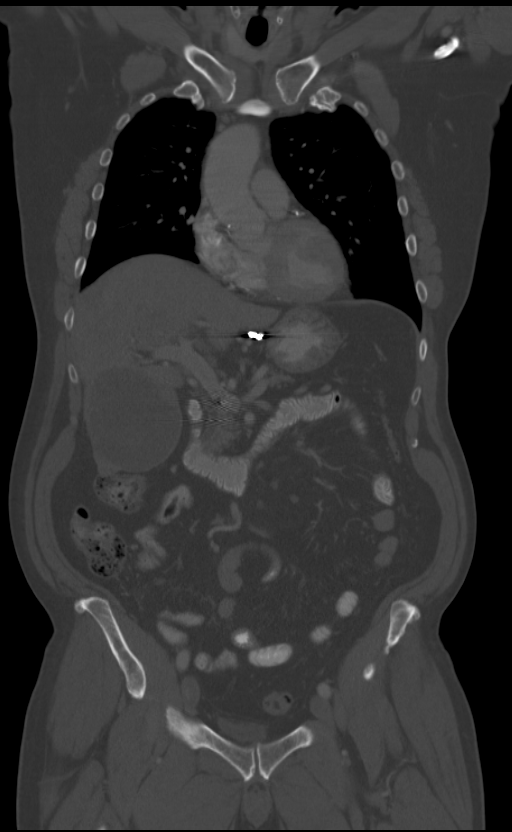
[im 56/101  soft-tissue]
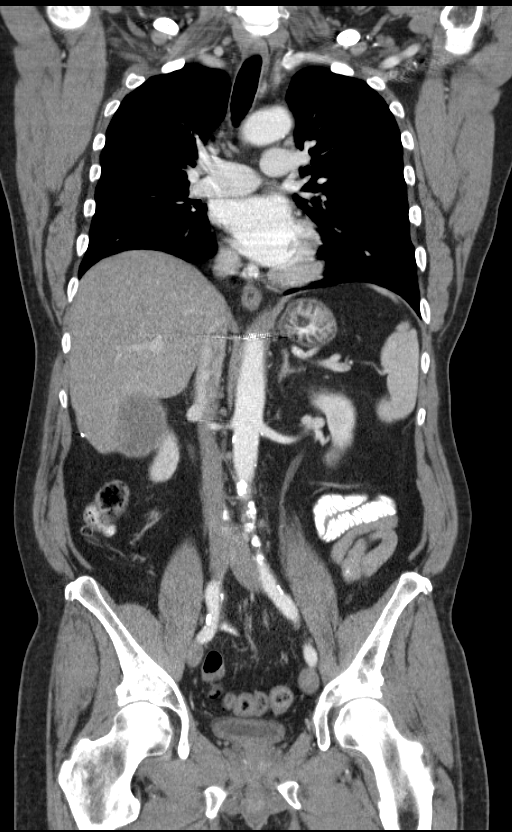

[Series 6: chest/abd/pel 3.0 sagittal · sagittal · 0.70mm/px · 1 of 121 slices shown, 2 images]
[im 41/121  soft-tissue]
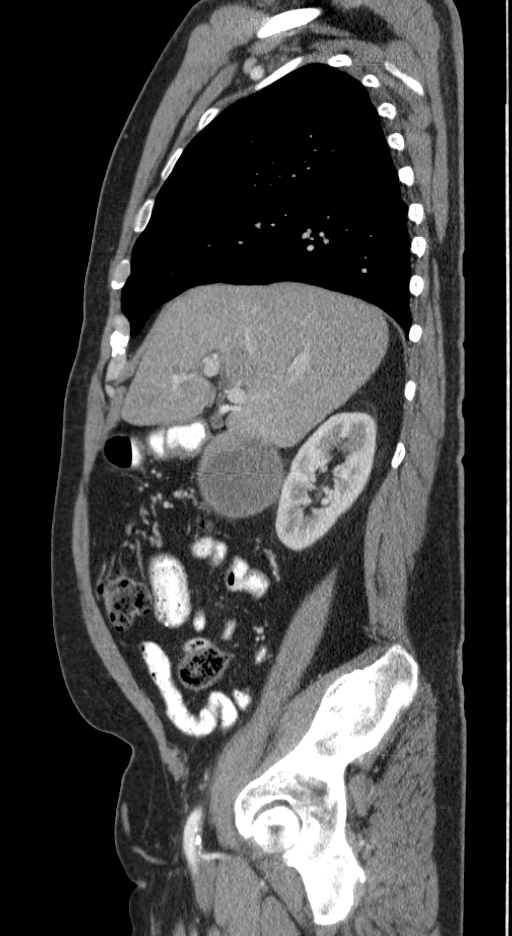
[im 41/121  bone]
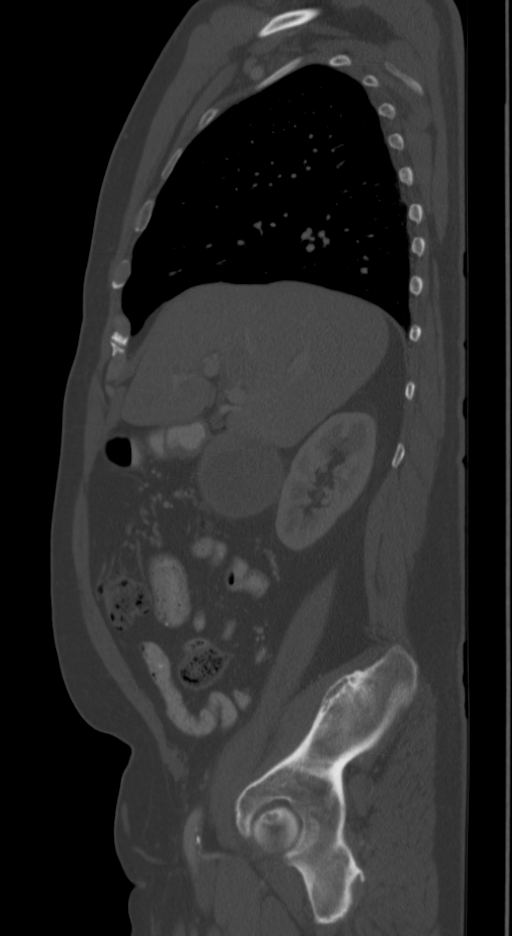

[Series 7: renal delay 5.0 b30f · axial · delayed · 0.81mm/px · z∈[+826,+900]mm · 3 of 31 slices shown, 7 images]
[im 8/31  soft-tissue]
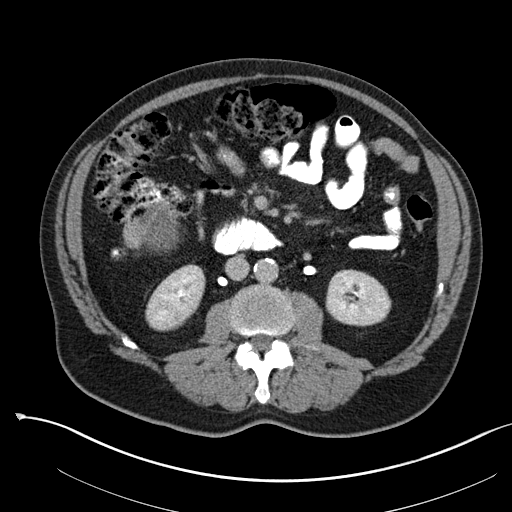
[im 8/31  lung]
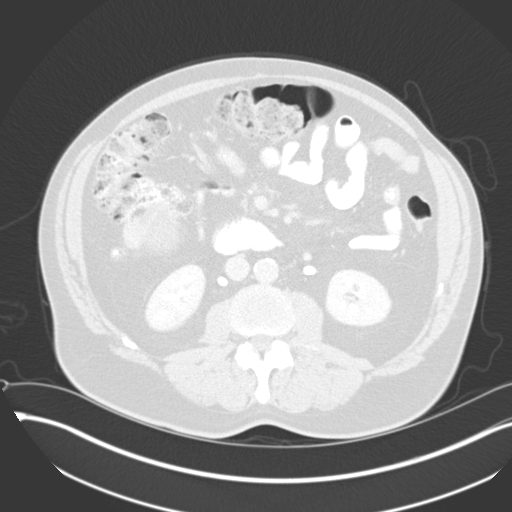
[im 8/31  bone]
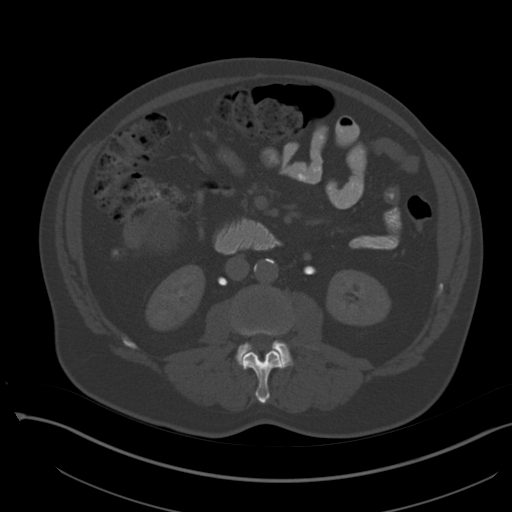
[im 16/31  soft-tissue]
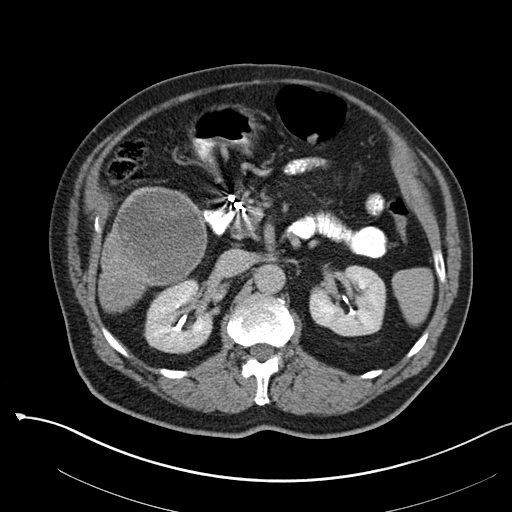
[im 16/31  lung]
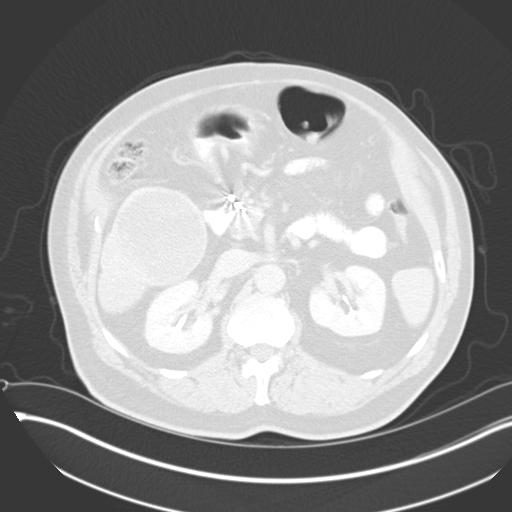
[im 23/31  soft-tissue]
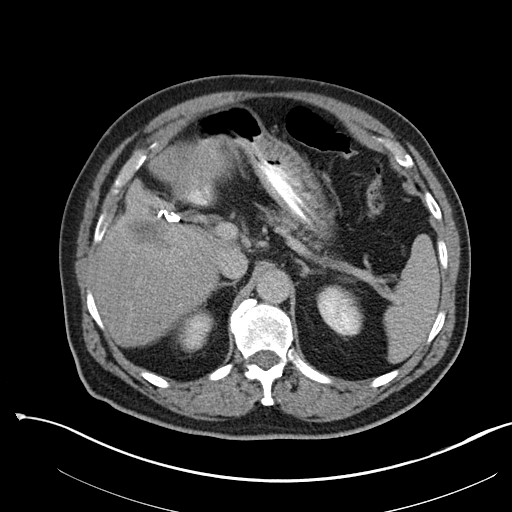
[im 23/31  lung]
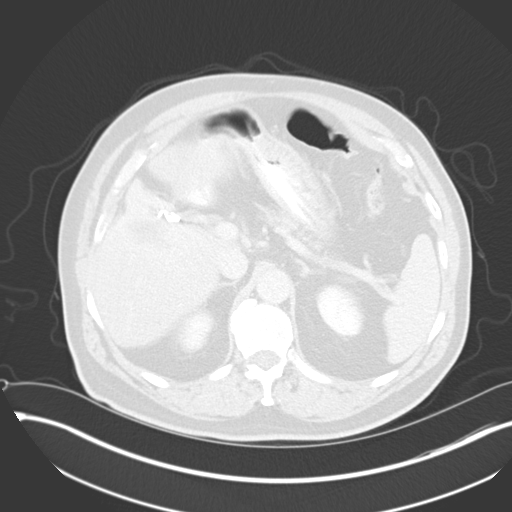

[7 of 46 positions shown; findings below may reference images not displayed]

FINDINGS: CT CHEST FINDINGS

Mediastinum: The heart size appears normal. No pericardial effusion.
There is calcified atherosclerotic disease involving the thoracic
aorta as well as the LAD, left circumflex coronary arteries. No
mediastinal or hilar adenopathy identified.

Lungs/Pleura: No pleural effusion. There is no airspace
consolidation or atelectasis. No suspicious pulmonary nodule or mass
identified. Calcified granuloma is identified in the left upper
lobe.

Musculoskeletal: Review of the visualized bony structures is
significant for thoracic spondylosis. Probable bone island involving
the left seventh rib is unchanged from previous exam. No suspicious
lytic or sclerotic bone lesions identified.

CT ABDOMEN AND PELVIS FINDINGS

Hepatobiliary: Lateral segment of left lobe of liver measures 3 x
4.2 cm, image 50/series 2. On the previous exam this measured 3.3 x
3.8 cm. Large exophytic mass arising from inferior right hepatic
lobe measures 9.2 x 8.0 cm, image 68/series 2. Previously 8.7 x
cm. Central right hepatic lobe lesion measures 3.5 x 2.4 cm, image
51/series 2. Previously this measured 3.1 x 2.6 cm. Previous
cholecystectomy.

Pancreas: Normal

Spleen: Normal

Adrenals/Urinary Tract: Normal appearance of the adrenal glands.
Unremarkable appearance of both kidneys. The urinary bladder appears
within normal limits.

Stomach/Bowel: The stomach is unremarkable. The small bowel loops
have a normal course and caliber without evidence for bowel
obstruction. Unremarkable appearance of the colon.

Vascular/Lymphatic: There is calcified atherosclerotic disease
involving the abdominal aorta. No aneurysm. No retroperitoneal lymph
nodes identified. No pelvic or inguinal adenopathy.

Reproductive: Prostate gland appears enlarged.

Other: Central, partially calcified mesenteric mass measures 2.6 x
2.9 cm, image 85/series 2. This is compared with the same
previously. Small surrounding mesenteric lymph nodes are again noted
and appear similar to prior study.

Musculoskeletal: Review of the visualized osseous structures is
significant for L5-S1 degenerative disc disease. No aggressive lytic
or sclerotic bone lesions identified.
IMPRESSION: 1. Stable CT of the chest.  No evidence for thoracic metastasis.
2. No significant change and multifocal liver metastasis with
measurements provided above.
3. Stable size of partially calcified mesenteric mass.

## 2015-01-13 MED ORDER — IOHEXOL 300 MG/ML  SOLN
100.0000 mL | Freq: Once | INTRAMUSCULAR | Status: AC | PRN
  Administered 2015-01-13: 100 mL via INTRAVENOUS

## 2015-01-14 ENCOUNTER — Encounter: Payer: Self-pay | Admitting: *Deleted

## 2015-01-17 ENCOUNTER — Ambulatory Visit (HOSPITAL_BASED_OUTPATIENT_CLINIC_OR_DEPARTMENT_OTHER): Payer: Medicare PPO | Admitting: Hematology & Oncology

## 2015-01-17 ENCOUNTER — Ambulatory Visit (HOSPITAL_BASED_OUTPATIENT_CLINIC_OR_DEPARTMENT_OTHER): Payer: Medicare PPO

## 2015-01-17 ENCOUNTER — Encounter: Payer: Self-pay | Admitting: Hematology & Oncology

## 2015-01-17 VITALS — BP 143/79 | HR 70 | Temp 98.2°F | Resp 18 | Ht 70.0 in | Wt 208.0 lb

## 2015-01-17 DIAGNOSIS — C7B02 Secondary carcinoid tumors of liver: Secondary | ICD-10-CM

## 2015-01-17 DIAGNOSIS — E340 Carcinoid syndrome: Secondary | ICD-10-CM

## 2015-01-17 DIAGNOSIS — C7A010 Malignant carcinoid tumor of the duodenum: Secondary | ICD-10-CM

## 2015-01-17 DIAGNOSIS — D499 Neoplasm of unspecified behavior of unspecified site: Secondary | ICD-10-CM

## 2015-01-17 DIAGNOSIS — C7A8 Other malignant neuroendocrine tumors: Secondary | ICD-10-CM

## 2015-01-17 LAB — CHROMOGRANIN A: CHROMOGRANIN A: 12 ng/mL (ref <=15)

## 2015-01-17 MED ORDER — OCTREOTIDE ACETATE 30 MG IM KIT
30.0000 mg | PACK | Freq: Once | INTRAMUSCULAR | Status: AC
  Administered 2015-01-17: 30 mg via INTRAMUSCULAR

## 2015-01-17 MED ORDER — OCTREOTIDE ACETATE 30 MG IM KIT
PACK | INTRAMUSCULAR | Status: AC
  Filled 2015-01-17: qty 1

## 2015-01-17 NOTE — Patient Instructions (Signed)

## 2015-01-17 NOTE — Progress Notes (Signed)
Hematology and Oncology Follow Up Visit  Brian Mays [id number] 1946-05-15 69 y.o. 01/17/2015   Principle Diagnosis:  Metastatic low grade neuroendocrine tumor-hepatic metastases   Current Therapy:    S/p second yttrium-90 intrahepatic therapy-May 2015  Sandostatin LAR 30 mg IM every month     Interim History:  [name] is back for followup. He is doing quite well. He is still working. He is only working part-time.  He has been able to play golf because of the warm weather. He's happy about this.  He did have a CT scan done last week. This may 6 showed stable disease.  His last chromogranin A level, which was done today actually, was 12.  He's not had any abdominal pain. He's had a problem bowels or bladder. He's had no nausea or vomiting. He's had no headache. He's had no cough. He's had no rashes. He's had no leg swelling.  Overall, his performance status is ECOG 1. Medications:  Current outpatient prescriptions:  .  amLODipine (NORVASC) 5 MG tablet, Take 5 mg by mouth every morning. , Disp: , Rfl:  .  aspirin EC 81 MG tablet, Take 81 mg by mouth every morning., Disp: , Rfl:  .  Cholecalciferol 1000 UNITS tablet, Take 1,000 Units by mouth daily.  , Disp: , Rfl:  .  fluticasone (FLONASE) 50 MCG/ACT nasal spray, , Disp: , Rfl: 11 .  hydrocortisone (ANUSOL-HC) 25 MG suppository, Place 25 mg rectally 2 (two) times daily as needed for hemorrhoids or itching., Disp: , Rfl:  .  lansoprazole (PREVACID) 30 MG capsule, Take 30 mg by mouth daily at 12 noon., Disp: , Rfl:  .  levothyroxine (SYNTHROID, LEVOTHROID) 50 MCG tablet, Take 50 mcg by mouth daily before breakfast., Disp: , Rfl:  .  lipase/protease/amylase (CREON-12/PANCREASE) 12000 UNITS CPEP capsule, Take 1 capsule by mouth 3 (three) times daily before meals., Disp: , Rfl:  .  losartan (COZAAR) 100 MG tablet, Take 1 tablet (100 mg total) by mouth every morning., Disp: 90 tablet, Rfl: 3 .  Octreotide Acetate (SANDOSTATIN IJ),  Inject 1 application as directed every 28 (twenty-eight) days. Pt is followed by Dr. Marin Olp office, Disp: , Rfl:  .  PARoxetine (PAXIL) 10 MG tablet, Take 1 tablet (10 mg total) by mouth daily., Disp: 30 tablet, Rfl: 3 .  sodium chloride (OCEAN) 0.65 % SOLN nasal spray, Place 1 spray into both nostrils as needed for congestion. , Disp: , Rfl:  .  vitamin B-12 (CYANOCOBALAMIN) 500 MCG tablet, Take 500 mcg by mouth 2 (two) times daily., Disp: , Rfl:   Allergies:  Allergies  Allergen Reactions  . Iohexol      Code: HIVES, Desc: PER [name] @ [facility], PT IS ALLERGIC TO CONTRAST DYE 10/02/08/RM  05/01/10...needs full premeds per our protocol w/ [facility]., Onset Date: [id number]     Past Medical History, Surgical history, Social history, and Family History were reviewed and updated.  Review of Systems: As above  Physical Exam:  height is 5\' 10"  (1.778 m) and weight is 208 lb (94.348 kg). His oral temperature is 98.2 F (36.8 C). His blood pressure is 143/79 and his pulse is 70. His respiration is 18.   Well-developed and well-nourished white gentleman. Head and exam shows no ocular or oral lesions. There are no palpable cervical or supraclavicular lymph nodes. Thyroid is not palpable. Lungs are clear. Cardiac exam regular rate and rhythm with a normal S1 and S2. He has no murmurs, rubs or  bruits.. Abdomen is soft. He has a well-healed laparotomy scar. There is no fluid wave. There is no guarding. He is no palpable liver or spleen tip. Back exam shows no tenderness over the spine, ribs or hips. Extremities shows no clubbing, cyanosis or edema. Skin exam no rashes. Neurological exam is nonfocal. Lab Results  Component Value Date   WBC 7.8 01/13/2015   HGB 14.7 01/13/2015   HCT 44.8 01/13/2015   MCV 86 01/13/2015   PLT 187 01/13/2015     Chemistry      Component Value Date/Time   NA 143 01/13/2015 0858   NA 140 09/19/2014 1317   NA 141 03/19/2014 1136   K 4.3 01/13/2015 0858   K 4.1  09/19/2014 1317   K 3.9 03/19/2014 1136   CL 103 01/13/2015 0858   CL 104 09/19/2014 1317   CO2 30 01/13/2015 0858   CO2 26 09/19/2014 1317   CO2 25 03/19/2014 1136   BUN 13 01/13/2015 0858   BUN 12 09/19/2014 1317   BUN 13.8 03/19/2014 1136   CREATININE 0.7 01/13/2015 0858   CREATININE 0.78 09/19/2014 1317   CREATININE 0.9 03/19/2014 1136      Component Value Date/Time   CALCIUM 9.6 01/13/2015 0858   CALCIUM 9.1 09/19/2014 1317   CALCIUM 9.4 03/19/2014 1136   ALKPHOS 70 01/13/2015 0858   ALKPHOS 70 09/19/2014 1317   ALKPHOS 71 03/19/2014 1136   AST 35 01/13/2015 0858   AST 26 09/19/2014 1317   AST 30 03/19/2014 1136   ALT 19 01/13/2015 0858   ALT 20 09/19/2014 1317   ALT 24 03/19/2014 1136   BILITOT 0.80 01/13/2015 0858   BILITOT 0.5 09/19/2014 1317   BILITOT 0.69 03/19/2014 1136         Impression and Plan: Brian Mays is 69 year old gentleman with a neuroendocrine carcinoma. He has liver metastases. He had a second intrahepatic therapy with yttrium-90 back in May 2015.   He will continue on the octreotide. He's doing well with this. The recent CT scan that he had looks pretty stable.  He is a good quality of life. He's doing what he pretty much likes to do.  I'll plan to get him back in one month just for his Sandostatin injection.  I'll see him back in 2 months.  I talked to to him about the potentially new FDA approved therapy for neuroendocrine cancers. This is a injectable radionucleotide which is an analog of octreotide. The trade name will be Lutathera. I think once his is available, this might be something very interesting to consider.     Volanda Napoleon, MD 3/18/20165:56 PM

## 2015-02-07 ENCOUNTER — Other Ambulatory Visit: Payer: Self-pay | Admitting: Hematology & Oncology

## 2015-02-14 ENCOUNTER — Other Ambulatory Visit (HOSPITAL_BASED_OUTPATIENT_CLINIC_OR_DEPARTMENT_OTHER): Payer: Medicare PPO

## 2015-02-14 ENCOUNTER — Ambulatory Visit (HOSPITAL_BASED_OUTPATIENT_CLINIC_OR_DEPARTMENT_OTHER): Payer: Medicare PPO | Admitting: Hematology & Oncology

## 2015-02-14 ENCOUNTER — Ambulatory Visit (HOSPITAL_BASED_OUTPATIENT_CLINIC_OR_DEPARTMENT_OTHER): Payer: Medicare PPO

## 2015-02-14 VITALS — BP 145/78 | HR 69 | Temp 98.0°F | Resp 18 | Ht 70.0 in | Wt 211.0 lb

## 2015-02-14 DIAGNOSIS — E340 Carcinoid syndrome: Secondary | ICD-10-CM

## 2015-02-14 DIAGNOSIS — D499 Neoplasm of unspecified behavior of unspecified site: Secondary | ICD-10-CM

## 2015-02-14 DIAGNOSIS — C7A8 Other malignant neuroendocrine tumors: Secondary | ICD-10-CM

## 2015-02-14 DIAGNOSIS — C7A010 Malignant carcinoid tumor of the duodenum: Secondary | ICD-10-CM

## 2015-02-14 DIAGNOSIS — C7B8 Other secondary neuroendocrine tumors: Secondary | ICD-10-CM | POA: Diagnosis not present

## 2015-02-14 LAB — CBC WITH DIFFERENTIAL (CANCER CENTER ONLY)
BASO#: 0 10*3/uL (ref 0.0–0.2)
BASO%: 0.8 % (ref 0.0–2.0)
EOS%: 5.1 % (ref 0.0–7.0)
Eosinophils Absolute: 0.3 10*3/uL (ref 0.0–0.5)
HEMATOCRIT: 40.4 % (ref 38.7–49.9)
HEMOGLOBIN: 13.3 g/dL (ref 13.0–17.1)
LYMPH#: 0.9 10*3/uL (ref 0.9–3.3)
LYMPH%: 16.9 % (ref 14.0–48.0)
MCH: 28.2 pg (ref 28.0–33.4)
MCHC: 32.9 g/dL (ref 32.0–35.9)
MCV: 86 fL (ref 82–98)
MONO#: 0.5 10*3/uL (ref 0.1–0.9)
MONO%: 8.8 % (ref 0.0–13.0)
NEUT#: 3.5 10*3/uL (ref 1.5–6.5)
NEUT%: 68.4 % (ref 40.0–80.0)
Platelets: 160 10*3/uL (ref 145–400)
RBC: 4.72 10*6/uL (ref 4.20–5.70)
RDW: 14.8 % (ref 11.1–15.7)
WBC: 5.1 10*3/uL (ref 4.0–10.0)

## 2015-02-14 LAB — CMP (CANCER CENTER ONLY)
ALK PHOS: 62 U/L (ref 26–84)
ALT(SGPT): 23 U/L (ref 10–47)
AST: 30 U/L (ref 11–38)
Albumin: 3.3 g/dL (ref 3.3–5.5)
BUN: 11 mg/dL (ref 7–22)
CALCIUM: 9.5 mg/dL (ref 8.0–10.3)
CHLORIDE: 103 meq/L (ref 98–108)
CO2: 31 mEq/L (ref 18–33)
Creat: 0.8 mg/dl (ref 0.6–1.2)
Glucose, Bld: 116 mg/dL (ref 73–118)
Potassium: 4.1 mEq/L (ref 3.3–4.7)
Sodium: 144 mEq/L (ref 128–145)
Total Bilirubin: 0.7 mg/dl (ref 0.20–1.60)
Total Protein: 8.4 g/dL — ABNORMAL HIGH (ref 6.4–8.1)

## 2015-02-14 MED ORDER — OCTREOTIDE ACETATE 30 MG IM KIT
PACK | INTRAMUSCULAR | Status: AC
  Filled 2015-02-14: qty 1

## 2015-02-14 MED ORDER — HYDROCORTISONE 2.5 % RE CREA: 1.0000 "application " | TOPICAL_CREAM | Freq: Two times a day (BID) | RECTAL | Status: DC

## 2015-02-14 MED ORDER — HYDROCORTISONE 2.5 % RE CREA
1.0000 | TOPICAL_CREAM | Freq: Two times a day (BID) | RECTAL | Status: DC
Start: 2015-02-14 — End: 2015-02-14

## 2015-02-14 MED ORDER — OCTREOTIDE ACETATE 30 MG IM KIT
30.0000 mg | PACK | Freq: Once | INTRAMUSCULAR | Status: AC
  Administered 2015-02-14: 30 mg via INTRAMUSCULAR

## 2015-02-14 NOTE — Patient Instructions (Signed)

## 2015-02-14 NOTE — Addendum Note (Signed)
Addended by: Burney Gauze R on: 02/14/2015 11:16 AM   Modules accepted: Orders

## 2015-02-14 NOTE — Addendum Note (Signed)
Addended by: Burney Gauze R on: 02/14/2015 11:21 AM   Modules accepted: Orders

## 2015-02-14 NOTE — Progress Notes (Signed)
Hematology and Oncology Follow Up Visit  Brian Mays [id number] Mar 27, 1946 69 y.o. 02/14/2015   Principle Diagnosis:  Metastatic low grade neuroendocrine tumor-hepatic metastases   Current Therapy:    S/p second yttrium-90 intrahepatic therapy-May 2015  Sandostatin LAR 30 mg IM every month     Interim History:  Mr.  Knoblock is back for followup. He is doing quite well. He is still working. He is only working part-time.  He has been able to play golf because of the warm weather. He's happy about this.  His last chromogranin A level, was 12.   He's not had any abdominal pain. He's had a problem bowels or bladder. He's had no nausea or vomiting. He's had no headache. He's had no cough. He's had no rashes. He's had no leg swelling.  Overall, his performance status is ECOG 1. Medications:  Current outpatient prescriptions:  .  amLODipine (NORVASC) 5 MG tablet, Take 5 mg by mouth every morning. , Disp: , Rfl:  .  aspirin EC 81 MG tablet, Take 81 mg by mouth every morning., Disp: , Rfl:  .  Cholecalciferol 1000 UNITS tablet, Take 1,000 Units by mouth daily.  , Disp: , Rfl:  .  fluticasone (FLONASE) 50 MCG/ACT nasal spray, Place 2 sprays into both nostrils daily. , Disp: , Rfl: 11 .  hydrocortisone (ANUSOL-HC) 25 MG suppository, INSERT 1 SUPPOSITORY RECTALLY TWICE A DAY AS NEEDED FOR HEMORRHOIDS, Disp: 30 suppository, Rfl: 0 .  lansoprazole (PREVACID) 30 MG capsule, Take 30 mg by mouth daily at 12 noon., Disp: , Rfl:  .  levothyroxine (SYNTHROID, LEVOTHROID) 50 MCG tablet, Take 50 mcg by mouth daily before breakfast., Disp: , Rfl:  .  lipase/protease/amylase (CREON-12/PANCREASE) 12000 UNITS CPEP capsule, Take 1 capsule by mouth 3 (three) times daily before meals., Disp: , Rfl:  .  losartan (COZAAR) 100 MG tablet, Take 1 tablet (100 mg total) by mouth every morning., Disp: 90 tablet, Rfl: 3 .  Octreotide Acetate (SANDOSTATIN IJ), Inject 1 application as directed every 28 (twenty-eight)  days. Pt is followed by Dr. Marin Olp office, Disp: , Rfl:  .  PARoxetine (PAXIL) 10 MG tablet, Take 1 tablet (10 mg total) by mouth daily., Disp: 30 tablet, Rfl: 3 .  sodium chloride (OCEAN) 0.65 % SOLN nasal spray, Place 1 spray into both nostrils as needed for congestion. , Disp: , Rfl:  .  vitamin B-12 (CYANOCOBALAMIN) 500 MCG tablet, Take 500 mcg by mouth 2 (two) times daily., Disp: , Rfl:   Allergies:  Allergies  Allergen Reactions  . Iohexol      Code: HIVES, Desc: PER [name] @ [facility], PT IS ALLERGIC TO CONTRAST DYE 10/02/08/RM  05/01/10...needs full premeds per our protocol w/ [facility]., Onset Date: [id number]     Past Medical History, Surgical history, Social history, and Family History were reviewed and updated.  Review of Systems: As above  Physical Exam:  vitals were not taken for this visit.  Well-developed and well-nourished white gentleman. Head and exam shows no ocular or oral lesions. There are no palpable cervical or supraclavicular lymph nodes. Thyroid is not palpable. Lungs are clear. Cardiac exam is regular rate and rhythm with a normal S1 and S2. He has no murmurs, rubs or bruits.. Abdomen is soft. He has a well-healed laparotomy scar. There is no fluid wave. There is no guarding. He is no palpable liver or spleen tip. Back exam shows no tenderness over the spine, ribs or hips. Extremities shows no clubbing, cyanosis  or edema. Skin exam no rashes. Neurological exam is nonfocal. Lab Results  Component Value Date   WBC 5.1 02/14/2015   HGB 13.3 02/14/2015   HCT 40.4 02/14/2015   MCV 86 02/14/2015   PLT 160 02/14/2015     Chemistry      Component Value Date/Time   NA 144 02/14/2015 0943   NA 140 09/19/2014 1317   NA 141 03/19/2014 1136   K 4.1 02/14/2015 0943   K 4.1 09/19/2014 1317   K 3.9 03/19/2014 1136   CL 103 02/14/2015 0943   CL 104 09/19/2014 1317   CO2 31 02/14/2015 0943   CO2 26 09/19/2014 1317   CO2 25 03/19/2014 1136   BUN 11 02/14/2015 0943     BUN 12 09/19/2014 1317   BUN 13.8 03/19/2014 1136   CREATININE 0.8 02/14/2015 0943   CREATININE 0.78 09/19/2014 1317   CREATININE 0.9 03/19/2014 1136      Component Value Date/Time   CALCIUM 9.5 02/14/2015 0943   CALCIUM 9.1 09/19/2014 1317   CALCIUM 9.4 03/19/2014 1136   ALKPHOS 62 02/14/2015 0943   ALKPHOS 70 09/19/2014 1317   ALKPHOS 71 03/19/2014 1136   AST 30 02/14/2015 0943   AST 26 09/19/2014 1317   AST 30 03/19/2014 1136   ALT 23 02/14/2015 0943   ALT 20 09/19/2014 1317   ALT 24 03/19/2014 1136   BILITOT 0.70 02/14/2015 0943   BILITOT 0.5 09/19/2014 1317   BILITOT 0.69 03/19/2014 1136         Impression and Plan: Mr. Vanecek is 69 year old gentleman with a neuroendocrine carcinoma. He has liver metastases. He had a second intrahepatic therapy with yttrium-90 back in May 2015.   He will continue on the octreotide. He's doing well with this. The recent CT scan that he had looks pretty stable.  He is a good quality of life. He's doing what he pretty much likes to do.  I'll plan to get him back in one month just for his Sandostatin injection.  I'll see him back in 2 months.  I talked to to him about the potentially new FDA approved therapy for neuroendocrine cancers. This is a injectable radionucleotide which is an analog of octreotide. The trade name will be Lutathera. I think once his is available, this might be something very interesting to consider.     Volanda Napoleon, MD 4/15/201611:04 AM

## 2015-02-19 LAB — LACTATE DEHYDROGENASE: LDH: 158 U/L (ref 94–250)

## 2015-02-19 LAB — CHROMOGRANIN A: Chromogranin A: 42 ng/mL — ABNORMAL HIGH (ref <=15)

## 2015-03-06 ENCOUNTER — Ambulatory Visit (INDEPENDENT_AMBULATORY_CARE_PROVIDER_SITE_OTHER): Payer: Medicare PPO | Admitting: Internal Medicine

## 2015-03-06 ENCOUNTER — Encounter: Payer: Self-pay | Admitting: Internal Medicine

## 2015-03-06 ENCOUNTER — Telehealth: Payer: Self-pay | Admitting: Internal Medicine

## 2015-03-06 VITALS — BP 140/92 | HR 68 | Ht 70.5 in | Wt 209.0 lb

## 2015-03-06 DIAGNOSIS — Z0000 Encounter for general adult medical examination without abnormal findings: Secondary | ICD-10-CM

## 2015-03-06 DIAGNOSIS — E559 Vitamin D deficiency, unspecified: Secondary | ICD-10-CM

## 2015-03-06 DIAGNOSIS — E039 Hypothyroidism, unspecified: Secondary | ICD-10-CM

## 2015-03-06 DIAGNOSIS — I10 Essential (primary) hypertension: Secondary | ICD-10-CM

## 2015-03-06 DIAGNOSIS — Z23 Encounter for immunization: Secondary | ICD-10-CM | POA: Diagnosis not present

## 2015-03-06 DIAGNOSIS — E538 Deficiency of other specified B group vitamins: Secondary | ICD-10-CM

## 2015-03-06 DIAGNOSIS — R739 Hyperglycemia, unspecified: Secondary | ICD-10-CM

## 2015-03-06 MED ORDER — LANSOPRAZOLE 30 MG PO CPDR: 30.0000 mg | DELAYED_RELEASE_CAPSULE | Freq: Every day | ORAL | Status: DC

## 2015-03-06 NOTE — Assessment & Plan Note (Signed)

## 2015-03-06 NOTE — Telephone Encounter (Signed)
Refill sent to [email or web address] 

## 2015-03-06 NOTE — Patient Instructions (Signed)
Preventive Care for Adults A healthy lifestyle and preventive care can promote health and wellness. Preventive health guidelines for men include the following key practices:  A routine yearly physical is a good way to check with your health care provider about your health and preventative screening. It is a chance to share any concerns and updates on your health and to receive a thorough exam.  Visit your dentist for a routine exam and preventative care every 6 months. Brush your teeth twice a day and floss once a day. Good oral hygiene prevents tooth decay and gum disease.  The frequency of eye exams is based on your age, health, family medical history, use of contact lenses, and other factors. Follow your health care provider's recommendations for frequency of eye exams.  Eat a healthy diet. Foods such as vegetables, fruits, whole grains, low-fat dairy products, and lean protein foods contain the nutrients you need without too many calories. Decrease your intake of foods high in solid fats, added sugars, and salt. Eat the right amount of calories for you.Get information about a proper diet from your health care provider, if necessary.  Regular physical exercise is one of the most important things you can do for your health. Most adults should get at least 150 minutes of moderate-intensity exercise (any activity that increases your heart rate and causes you to sweat) each week. In addition, most adults need muscle-strengthening exercises on 2 or more days a week.  Maintain a healthy weight. The body mass index (BMI) is a screening tool to identify possible weight problems. It provides an estimate of body fat based on height and weight. Your health care provider can find your BMI and can help you achieve or maintain a healthy weight.For adults 20 years and older:  A BMI below 18.5 is considered underweight.  A BMI of 18.5 to 24.9 is normal.  A BMI of 25 to 29.9 is considered overweight.  A BMI  of 30 and above is considered obese.  Maintain normal blood lipids and cholesterol levels by exercising and minimizing your intake of saturated fat. Eat a balanced diet with plenty of fruit and vegetables. Blood tests for lipids and cholesterol should begin at age 50 and be repeated every 5 years. If your lipid or cholesterol levels are high, you are over 50, or you are at high risk for heart disease, you may need your cholesterol levels checked more frequently.Ongoing high lipid and cholesterol levels should be treated with medicines if diet and exercise are not working.  If you smoke, find out from your health care provider how to quit. If you do not use tobacco, do not start.  Lung cancer screening is recommended for adults aged 73-80 years who are at high risk for developing lung cancer because of a history of smoking. A yearly low-dose CT scan of the lungs is recommended for people who have at least a 30-pack-year history of smoking and are a current smoker or have quit within the past 15 years. A pack year of smoking is smoking an average of 1 pack of cigarettes a day for 1 year (for example: 1 pack a day for 30 years or 2 packs a day for 15 years). Yearly screening should continue until the smoker has stopped smoking for at least 15 years. Yearly screening should be stopped for people who develop a health problem that would prevent them from having lung cancer treatment.  If you choose to drink alcohol, do not have more than  2 drinks per day. One drink is considered to be 12 ounces (355 mL) of beer, 5 ounces (148 mL) of wine, or 1.5 ounces (44 mL) of liquor.  Avoid use of street drugs. Do not share needles with anyone. Ask for help if you need support or instructions about stopping the use of drugs.  High blood pressure causes heart disease and increases the risk of stroke. Your blood pressure should be checked at least every 1-2 years. Ongoing high blood pressure should be treated with  medicines, if weight loss and exercise are not effective.  If you are 45-79 years old, ask your health care provider if you should take aspirin to prevent heart disease.  Diabetes screening involves taking a blood sample to check your fasting blood sugar level. This should be done once every 3 years, after age 45, if you are within normal weight and without risk factors for diabetes. Testing should be considered at a younger age or be carried out more frequently if you are overweight and have at least 1 risk factor for diabetes.  Colorectal cancer can be detected and often prevented. Most routine colorectal cancer screening begins at the age of 50 and continues through age 75. However, your health care provider may recommend screening at an earlier age if you have risk factors for colon cancer. On a yearly basis, your health care provider may provide home test kits to check for hidden blood in the stool. Use of a small camera at the end of a tube to directly examine the colon (sigmoidoscopy or colonoscopy) can detect the earliest forms of colorectal cancer. Talk to your health care provider about this at age 50, when routine screening begins. Direct exam of the colon should be repeated every 5-10 years through age 75, unless early forms of precancerous polyps or small growths are found.  People who are at an increased risk for hepatitis B should be screened for this virus. You are considered at high risk for hepatitis B if:  You were born in a country where hepatitis B occurs often. Talk with your health care provider about which countries are considered high risk.  Your parents were born in a high-risk country and you have not received a shot to protect against hepatitis B (hepatitis B vaccine).  You have HIV or AIDS.  You use needles to inject street drugs.  You live with, or have sex with, someone who has hepatitis B.  You are a man who has sex with other men (MSM).  You get hemodialysis  treatment.  You take certain medicines for conditions such as cancer, organ transplantation, and autoimmune conditions.  Hepatitis C blood testing is recommended for all people born from 1945 through 1965 and any individual with known risks for hepatitis C.  Practice safe sex. Use condoms and avoid high-risk sexual practices to reduce the spread of sexually transmitted infections (STIs). STIs include gonorrhea, chlamydia, syphilis, trichomonas, herpes, HPV, and human immunodeficiency virus (HIV). Herpes, HIV, and HPV are viral illnesses that have no cure. They can result in disability, cancer, and death.  If you are at risk of being infected with HIV, it is recommended that you take a prescription medicine daily to prevent HIV infection. This is called preexposure prophylaxis (PrEP). You are considered at risk if:  You are a man who has sex with other men (MSM) and have other risk factors.  You are a heterosexual man, are sexually active, and are at increased risk for HIV infection.    You take drugs by injection.  You are sexually active with a partner who has HIV.  Talk with your health care provider about whether you are at high risk of being infected with HIV. If you choose to begin PrEP, you should first be tested for HIV. You should then be tested every 3 months for as long as you are taking PrEP.  A one-time screening for abdominal aortic aneurysm (AAA) and surgical repair of large AAAs by ultrasound are recommended for men ages 32 to 67 years who are current or former smokers.  Healthy men should no longer receive prostate-specific antigen (PSA) blood tests as part of routine cancer screening. Talk with your health care provider about prostate cancer screening.  Testicular cancer screening is not recommended for adult males who have no symptoms. Screening includes self-exam, a health care provider exam, and other screening tests. Consult with your health care provider about any symptoms  you have or any concerns you have about testicular cancer.  Use sunscreen. Apply sunscreen liberally and repeatedly throughout the day. You should seek shade when your shadow is shorter than you. Protect yourself by wearing long sleeves, pants, a wide-brimmed hat, and sunglasses year round, whenever you are outdoors.  Once a month, do a whole-body skin exam, using a mirror to look at the skin on your back. Tell your health care provider about new moles, moles that have irregular borders, moles that are larger than a pencil eraser, or moles that have changed in shape or color.  Stay current with required vaccines (immunizations).  Influenza vaccine. All adults should be immunized every year.  Tetanus, diphtheria, and acellular pertussis (Td, Tdap) vaccine. An adult who has not previously received Tdap or who does not know his vaccine status should receive 1 dose of Tdap. This initial dose should be followed by tetanus and diphtheria toxoids (Td) booster doses every 10 years. Adults with an unknown or incomplete history of completing a 3-dose immunization series with Td-containing vaccines should begin or complete a primary immunization series including a Tdap dose. Adults should receive a Td booster every 10 years.  Varicella vaccine. An adult without evidence of immunity to varicella should receive 2 doses or a second dose if he has previously received 1 dose.  Human papillomavirus (HPV) vaccine. Males aged 68-21 years who have not received the vaccine previously should receive the 3-dose series. Males aged 22-26 years may be immunized. Immunization is recommended through the age of 6 years for any male who has sex with males and did not get any or all doses earlier. Immunization is recommended for any person with an immunocompromised condition through the age of 49 years if he did not get any or all doses earlier. During the 3-dose series, the second dose should be obtained 4-8 weeks after the first  dose. The third dose should be obtained 24 weeks after the first dose and 16 weeks after the second dose.  Zoster vaccine. One dose is recommended for adults aged 50 years or older unless certain conditions are present.  Measles, mumps, and rubella (MMR) vaccine. Adults born before [birth year] generally are considered immune to measles and mumps. Adults born in [birth year] or later should have 1 or more doses of MMR vaccine unless there is a contraindication to the vaccine or there is laboratory evidence of immunity to each of the three diseases. A routine second dose of MMR vaccine should be obtained at least 28 days after the first dose for students attending postsecondary  schools, health care workers, or international travelers. People who received inactivated measles vaccine or an unknown type of measles vaccine during 1963-1967 should receive 2 doses of MMR vaccine. People who received inactivated mumps vaccine or an unknown type of mumps vaccine before 1979 and are at high risk for mumps infection should consider immunization with 2 doses of MMR vaccine. Unvaccinated health care workers born before 1957 who lack laboratory evidence of measles, mumps, or rubella immunity or laboratory confirmation of disease should consider measles and mumps immunization with 2 doses of MMR vaccine or rubella immunization with 1 dose of MMR vaccine.  Pneumococcal 13-valent conjugate (PCV13) vaccine. When indicated, a person who is uncertain of his immunization history and has no record of immunization should receive the PCV13 vaccine. An adult aged 19 years or older who has certain medical conditions and has not been previously immunized should receive 1 dose of PCV13 vaccine. This PCV13 should be followed with a dose of pneumococcal polysaccharide (PPSV23) vaccine. The PPSV23 vaccine dose should be obtained at least 8 weeks after the dose of PCV13 vaccine. An adult aged 19 years or older who has certain medical conditions and  previously received 1 or more doses of PPSV23 vaccine should receive 1 dose of PCV13. The PCV13 vaccine dose should be obtained 1 or more years after the last PPSV23 vaccine dose.  Pneumococcal polysaccharide (PPSV23) vaccine. When PCV13 is also indicated, PCV13 should be obtained first. All adults aged 65 years and older should be immunized. An adult younger than age 65 years who has certain medical conditions should be immunized. Any person who resides in a nursing home or long-term care facility should be immunized. An adult smoker should be immunized. People with an immunocompromised condition and certain other conditions should receive both PCV13 and PPSV23 vaccines. People with human immunodeficiency virus (HIV) infection should be immunized as soon as possible after diagnosis. Immunization during chemotherapy or radiation therapy should be avoided. Routine use of PPSV23 vaccine is not recommended for American Indians, [place] Natives, or people younger than 65 years unless there are medical conditions that require PPSV23 vaccine. When indicated, people who have unknown immunization and have no record of immunization should receive PPSV23 vaccine. One-time revaccination 5 years after the first dose of PPSV23 is recommended for people aged 19-64 years who have chronic kidney failure, nephrotic syndrome, asplenia, or immunocompromised conditions. People who received 1-2 doses of PPSV23 before age 65 years should receive another dose of PPSV23 vaccine at age 65 years or later if at least 5 years have passed since the previous dose. Doses of PPSV23 are not needed for people immunized with PPSV23 at or after age 65 years.  Meningococcal vaccine. Adults with asplenia or persistent complement component deficiencies should receive 2 doses of quadrivalent meningococcal conjugate (MenACWY-D) vaccine. The doses should be obtained at least 2 months apart. Microbiologists working with certain meningococcal bacteria,  military recruits, people at risk during an outbreak, and people who travel to or live in countries with a high rate of meningitis should be immunized. A first-year college student up through age 21 years who is living in a residence hall should receive a dose if he did not receive a dose on or after his 16th birthday. Adults who have certain high-risk conditions should receive one or more doses of vaccine.  Hepatitis A vaccine. Adults who wish to be protected from this disease, have certain high-risk conditions, work with hepatitis A-infected animals, work in hepatitis A research labs, or   travel to or work in countries with a high rate of hepatitis A should be immunized. Adults who were previously unvaccinated and who anticipate close contact with an international adoptee during the first 60 days after arrival in the [place] from a country with a high rate of hepatitis A should be immunized.  Hepatitis B vaccine. Adults should be immunized if they wish to be protected from this disease, have certain high-risk conditions, may be exposed to blood or other infectious body fluids, are household contacts or sex partners of hepatitis B positive people, are clients or workers in certain care facilities, or travel to or work in countries with a high rate of hepatitis B.  Haemophilus influenzae type b (Hib) vaccine. A previously unvaccinated person with asplenia or sickle cell disease or having a scheduled splenectomy should receive 1 dose of Hib vaccine. Regardless of previous immunization, a recipient of a hematopoietic stem cell transplant should receive a 3-dose series 6-12 months after his successful transplant. Hib vaccine is not recommended for adults with HIV infection. Preventive Service / Frequency Ages 52 to 17  Blood pressure check.** / Every 1 to 2 years.  Lipid and cholesterol check.** / Every 5 years beginning at age 69.  Hepatitis C blood test.** / For any individual with known risks for  hepatitis C.  Skin self-exam. / Monthly.  Influenza vaccine. / Every year.  Tetanus, diphtheria, and acellular pertussis (Tdap, Td) vaccine.** / Consult your health care provider. 1 dose of Td every 10 years.  Varicella vaccine.** / Consult your health care provider.  HPV vaccine. / 3 doses over 6 months, if 72 or younger.  Measles, mumps, rubella (MMR) vaccine.** / You need at least 1 dose of MMR if you were born in 1957 or later. You may also need a second dose.  Pneumococcal 13-valent conjugate (PCV13) vaccine.** / Consult your health care provider.  Pneumococcal polysaccharide (PPSV23) vaccine.** / 1 to 2 doses if you smoke cigarettes or if you have certain conditions.  Meningococcal vaccine.** / 1 dose if you are age 35 to 60 years and a Market researcher living in a residence hall, or have one of several medical conditions. You may also need additional booster doses.  Hepatitis A vaccine.** / Consult your health care provider.  Hepatitis B vaccine.** / Consult your health care provider.  Haemophilus influenzae type b (Hib) vaccine.** / Consult your health care provider. Ages 35 to 8  Blood pressure check.** / Every 1 to 2 years.  Lipid and cholesterol check.** / Every 5 years beginning at age 57.  Lung cancer screening. / Every year if you are aged 44-80 years and have a 30-pack-year history of smoking and currently smoke or have quit within the past 15 years. Yearly screening is stopped once you have quit smoking for at least 15 years or develop a health problem that would prevent you from having lung cancer treatment.  Fecal occult blood test (FOBT) of stool. / Every year beginning at age 55 and continuing until age 73. You may not have to do this test if you get a colonoscopy every 10 years.  Flexible sigmoidoscopy** or colonoscopy.** / Every 5 years for a flexible sigmoidoscopy or every 10 years for a colonoscopy beginning at age 28 and continuing until age  1.  Hepatitis C blood test.** / For all people born from [birth year] through 1965 and any individual with known risks for hepatitis C.  Skin self-exam. / Monthly.  Influenza vaccine. / Every  year.  Tetanus, diphtheria, and acellular pertussis (Tdap/Td) vaccine.** / Consult your health care provider. 1 dose of Td every 10 years.  Varicella vaccine.** / Consult your health care provider.  Zoster vaccine.** / 1 dose for adults aged 53 years or older.  Measles, mumps, rubella (MMR) vaccine.** / You need at least 1 dose of MMR if you were born in 1957 or later. You may also need a second dose.  Pneumococcal 13-valent conjugate (PCV13) vaccine.** / Consult your health care provider.  Pneumococcal polysaccharide (PPSV23) vaccine.** / 1 to 2 doses if you smoke cigarettes or if you have certain conditions.  Meningococcal vaccine.** / Consult your health care provider.  Hepatitis A vaccine.** / Consult your health care provider.  Hepatitis B vaccine.** / Consult your health care provider.  Haemophilus influenzae type b (Hib) vaccine.** / Consult your health care provider. Ages 77 and over  Blood pressure check.** / Every 1 to 2 years.  Lipid and cholesterol check.**/ Every 5 years beginning at age 85.  Lung cancer screening. / Every year if you are aged 55-80 years and have a 30-pack-year history of smoking and currently smoke or have quit within the past 15 years. Yearly screening is stopped once you have quit smoking for at least 15 years or develop a health problem that would prevent you from having lung cancer treatment.  Fecal occult blood test (FOBT) of stool. / Every year beginning at age 33 and continuing until age 11. You may not have to do this test if you get a colonoscopy every 10 years.  Flexible sigmoidoscopy** or colonoscopy.** / Every 5 years for a flexible sigmoidoscopy or every 10 years for a colonoscopy beginning at age 28 and continuing until age 73.  Hepatitis C blood  test.** / For all people born from [birth year] through 1965 and any individual with known risks for hepatitis C.  Abdominal aortic aneurysm (AAA) screening.** / A one-time screening for ages 50 to 27 years who are current or former smokers.  Skin self-exam. / Monthly.  Influenza vaccine. / Every year.  Tetanus, diphtheria, and acellular pertussis (Tdap/Td) vaccine.** / 1 dose of Td every 10 years.  Varicella vaccine.** / Consult your health care provider.  Zoster vaccine.** / 1 dose for adults aged 34 years or older.  Pneumococcal 13-valent conjugate (PCV13) vaccine.** / Consult your health care provider.  Pneumococcal polysaccharide (PPSV23) vaccine.** / 1 dose for all adults aged 63 years and older.  Meningococcal vaccine.** / Consult your health care provider.  Hepatitis A vaccine.** / Consult your health care provider.  Hepatitis B vaccine.** / Consult your health care provider.  Haemophilus influenzae type b (Hib) vaccine.** / Consult your health care provider. **Family history and personal history of risk and conditions may change your health care provider's recommendations. Document Released: 12/14/2001 Document Revised: 10/23/2013 Document Reviewed: 03/15/2011 [facility] Patient Information 2015 [place], [place]. This information is not intended to replace advice given to you by your health care provider. Make sure you discuss any questions you have with your health care provider.

## 2015-03-06 NOTE — Assessment & Plan Note (Signed)
Recheck labs On B12

## 2015-03-06 NOTE — Assessment & Plan Note (Signed)
Labs Levothroid 

## 2015-03-06 NOTE — Progress Notes (Signed)
Pre visit review using our clinic review tool, if applicable. No additional management support is needed unless otherwise documented below in the visit note. 

## 2015-03-06 NOTE — Progress Notes (Signed)
   Subjective:    Patient ID: Brian Mays, male    DOB: [birth year], 69 y.o.   MRN: [id number]  HPI  The patient is here for a wellness exam. The patient has been doing well overall without major physical or psychological issues going on lately.  Wt Readings from Last 3 Encounters:  03/06/15 209 lb (94.802 kg)  02/14/15 211 lb (95.709 kg)  01/17/15 208 lb (94.348 kg)   BP Readings from Last 3 Encounters:  03/06/15 140/92  02/14/15 145/78  01/17/15 143/79      Review of Systems  Constitutional: Negative for appetite change, fatigue and unexpected weight change.  HENT: Positive for hearing loss. Negative for congestion, ear pain, nosebleeds, sneezing, sore throat and trouble swallowing.   Eyes: Negative for itching and visual disturbance.  Respiratory: Negative for cough and wheezing.   Cardiovascular: Negative for chest pain, palpitations and leg swelling.  Gastrointestinal: Negative for nausea, abdominal pain, diarrhea, blood in stool and abdominal distention.  Genitourinary: Negative for urgency, frequency, hematuria and decreased urine volume.  Musculoskeletal: Negative for back pain, joint swelling, gait problem and neck pain.  Skin: Negative for rash.  Neurological: Negative for dizziness, tremors, speech difficulty and weakness.  Psychiatric/Behavioral: Negative for suicidal ideas, sleep disturbance, dysphoric mood and agitation. The patient is not nervous/anxious.        Objective:   Physical Exam  Constitutional: He is oriented to person, place, and time. He appears well-developed. No distress.  NAD  HENT:  Mouth/Throat: Oropharynx is clear and moist.  Hearing aids  Eyes: Conjunctivae are normal. Pupils are equal, round, and reactive to light.  Neck: Normal range of motion. No JVD present. No thyromegaly present.  Cardiovascular: Normal rate, regular rhythm, normal heart sounds and intact distal pulses.  Exam reveals no gallop and no friction rub.   No murmur  heard. Pulmonary/Chest: Effort normal and breath sounds normal. No respiratory distress. He has no wheezes. He has no rales. He exhibits no tenderness.  Abdominal: Soft. Bowel sounds are normal. He exhibits no distension and no mass. There is no tenderness. There is no rebound and no guarding.  Musculoskeletal: Normal range of motion. He exhibits no edema or tenderness.  Lymphadenopathy:    He has no cervical adenopathy.  Neurological: He is alert and oriented to person, place, and time. He has normal reflexes. No cranial nerve deficit. He exhibits normal muscle tone. He displays a negative Romberg sign. Coordination and gait normal.  Skin: Skin is warm and dry. No rash noted. No pallor.  Psychiatric: He has a normal mood and affect. His behavior is normal. Judgment and thought content normal.   Hearing aids Pt declined rectal exam  Lab Results  Component Value Date   WBC 6.0 03/07/2015   HGB 13.7 03/07/2015   HCT 40.8 03/07/2015   PLT 171.0 03/07/2015   GLUCOSE 98 03/07/2015   CHOL 164 03/07/2015   TRIG 95.0 03/07/2015   HDL 44.60 03/07/2015   LDLDIRECT 147.6 12/08/2007   LDLCALC 100* 03/07/2015   ALT 16 03/07/2015   AST 22 03/07/2015   NA 141 03/07/2015   K 4.3 03/07/2015   CL 103 03/07/2015   CREATININE 0.86 03/07/2015   BUN 13 03/07/2015   CO2 31 03/07/2015   TSH 4.44 03/07/2015   PSA 0.50 03/07/2015   INR 1.03 03/26/2014   HGBA1C 5.8 03/07/2015       Assessment & Plan:

## 2015-03-06 NOTE — Telephone Encounter (Signed)
Patient requesting a refill   lansoprazole (PREVACID) 30 MG capsule [[id number]]      [facility] is [facility] [address]

## 2015-03-06 NOTE — Assessment & Plan Note (Signed)
Labs

## 2015-03-07 ENCOUNTER — Other Ambulatory Visit (INDEPENDENT_AMBULATORY_CARE_PROVIDER_SITE_OTHER): Payer: Medicare PPO

## 2015-03-07 DIAGNOSIS — Z0000 Encounter for general adult medical examination without abnormal findings: Secondary | ICD-10-CM

## 2015-03-07 DIAGNOSIS — R739 Hyperglycemia, unspecified: Secondary | ICD-10-CM | POA: Diagnosis not present

## 2015-03-07 DIAGNOSIS — E039 Hypothyroidism, unspecified: Secondary | ICD-10-CM | POA: Diagnosis not present

## 2015-03-07 DIAGNOSIS — E559 Vitamin D deficiency, unspecified: Secondary | ICD-10-CM | POA: Diagnosis not present

## 2015-03-07 DIAGNOSIS — E538 Deficiency of other specified B group vitamins: Secondary | ICD-10-CM

## 2015-03-07 LAB — CBC WITH DIFFERENTIAL/PLATELET
BASOS ABS: 0 10*3/uL (ref 0.0–0.1)
BASOS PCT: 0.8 % (ref 0.0–3.0)
Eosinophils Absolute: 0.4 10*3/uL (ref 0.0–0.7)
Eosinophils Relative: 7 % — ABNORMAL HIGH (ref 0.0–5.0)
HCT: 40.8 % (ref 39.0–52.0)
Hemoglobin: 13.7 g/dL (ref 13.0–17.0)
LYMPHS ABS: 1 10*3/uL (ref 0.7–4.0)
Lymphocytes Relative: 16.9 % (ref 12.0–46.0)
MCHC: 33.5 g/dL (ref 30.0–36.0)
MCV: 82.6 fl (ref 78.0–100.0)
MONOS PCT: 6.5 % (ref 3.0–12.0)
Monocytes Absolute: 0.4 10*3/uL (ref 0.1–1.0)
NEUTROS ABS: 4.2 10*3/uL (ref 1.4–7.7)
Neutrophils Relative %: 68.8 % (ref 43.0–77.0)
Platelets: 171 10*3/uL (ref 150.0–400.0)
RBC: 4.94 Mil/uL (ref 4.22–5.81)
RDW: 15.2 % (ref 11.5–15.5)
WBC: 6 10*3/uL (ref 4.0–10.5)

## 2015-03-07 LAB — BASIC METABOLIC PANEL
BUN: 13 mg/dL (ref 6–23)
CALCIUM: 9.3 mg/dL (ref 8.4–10.5)
CO2: 31 meq/L (ref 19–32)
Chloride: 103 mEq/L (ref 96–112)
Creatinine, Ser: 0.86 mg/dL (ref 0.40–1.50)
GFR: 93.98 mL/min (ref >60.00)
GLUCOSE: 98 mg/dL (ref 70–99)
POTASSIUM: 4.3 meq/L (ref 3.5–5.1)
SODIUM: 141 meq/L (ref 135–145)

## 2015-03-07 LAB — URINALYSIS
Bilirubin Urine: NEGATIVE
KETONES UR: NEGATIVE
LEUKOCYTES UA: NEGATIVE
Nitrite: NEGATIVE
PH: 6 (ref 5.0–8.0)
Specific Gravity, Urine: 1.02 (ref 1.000–1.030)
TOTAL PROTEIN, URINE-UPE24: NEGATIVE
Urine Glucose: NEGATIVE
Urobilinogen, UA: 0.2 (ref 0.0–1.0)

## 2015-03-07 LAB — HEPATIC FUNCTION PANEL
ALBUMIN: 3.6 g/dL (ref 3.5–5.2)
ALT: 16 U/L (ref 0–53)
AST: 22 U/L (ref 0–37)
Alkaline Phosphatase: 65 U/L (ref 39–117)
Bilirubin, Direct: 0.1 mg/dL (ref 0.0–0.3)
Total Bilirubin: 0.5 mg/dL (ref 0.2–1.2)
Total Protein: 7.3 g/dL (ref 6.0–8.3)

## 2015-03-07 LAB — LIPID PANEL
Cholesterol: 164 mg/dL (ref 0–200)
HDL: 44.6 mg/dL (ref >39.00)
LDL Cholesterol: 100 mg/dL — ABNORMAL HIGH (ref 0–99)
NONHDL: 119.4
Total CHOL/HDL Ratio: 4
Triglycerides: 95 mg/dL (ref 0.0–149.0)
VLDL: 19 mg/dL (ref 0.0–40.0)

## 2015-03-07 LAB — HEMOGLOBIN A1C: HEMOGLOBIN A1C: 5.8 % (ref 4.6–6.5)

## 2015-03-07 LAB — TSH: TSH: 4.44 u[IU]/mL (ref 0.35–4.50)

## 2015-03-07 LAB — VITAMIN D 25 HYDROXY (VIT D DEFICIENCY, FRACTURES): VITD: 21.33 ng/mL — AB (ref 30.00–100.00)

## 2015-03-07 LAB — PSA: PSA: 0.5 ng/mL (ref 0.10–4.00)

## 2015-03-07 LAB — VITAMIN B12: VITAMIN B 12: 749 pg/mL (ref 211–911)

## 2015-03-10 MED ORDER — ERGOCALCIFEROL 1.25 MG (50000 UT) PO CAPS: 50000.0000 [IU] | ORAL_CAPSULE | ORAL | Status: DC

## 2015-03-11 DIAGNOSIS — E559 Vitamin D deficiency, unspecified: Secondary | ICD-10-CM | POA: Insufficient documentation

## 2015-03-11 NOTE — Assessment & Plan Note (Signed)
start Vit D prescription 50000 iu weekly (Rx emailed to your pharmacy) followed by over-the-counter Vit D 1000 iu daily.  

## 2015-03-11 NOTE — Assessment & Plan Note (Signed)
Chronic  Amlodipine, Losartan 

## 2015-03-14 ENCOUNTER — Ambulatory Visit (HOSPITAL_BASED_OUTPATIENT_CLINIC_OR_DEPARTMENT_OTHER): Payer: Medicare PPO

## 2015-03-14 ENCOUNTER — Other Ambulatory Visit: Payer: Self-pay | Admitting: Hematology & Oncology

## 2015-03-14 VITALS — BP 140/72 | HR 66 | Temp 98.1°F | Resp 16 | Ht 70.0 in | Wt 208.0 lb

## 2015-03-14 DIAGNOSIS — C7A8 Other malignant neuroendocrine tumors: Secondary | ICD-10-CM

## 2015-03-14 DIAGNOSIS — C7B8 Other secondary neuroendocrine tumors: Secondary | ICD-10-CM

## 2015-03-14 DIAGNOSIS — D499 Neoplasm of unspecified behavior of unspecified site: Secondary | ICD-10-CM

## 2015-03-14 DIAGNOSIS — E340 Carcinoid syndrome: Secondary | ICD-10-CM | POA: Diagnosis not present

## 2015-03-14 MED ORDER — OCTREOTIDE ACETATE 30 MG IM KIT
30.0000 mg | PACK | Freq: Once | INTRAMUSCULAR | Status: AC
  Administered 2015-03-14: 30 mg via INTRAMUSCULAR

## 2015-03-14 NOTE — Patient Instructions (Signed)

## 2015-04-11 ENCOUNTER — Other Ambulatory Visit (HOSPITAL_BASED_OUTPATIENT_CLINIC_OR_DEPARTMENT_OTHER): Payer: Medicare PPO

## 2015-04-11 ENCOUNTER — Encounter: Payer: Self-pay | Admitting: Family

## 2015-04-11 ENCOUNTER — Ambulatory Visit (HOSPITAL_BASED_OUTPATIENT_CLINIC_OR_DEPARTMENT_OTHER): Payer: Medicare PPO | Admitting: Family

## 2015-04-11 ENCOUNTER — Ambulatory Visit (HOSPITAL_BASED_OUTPATIENT_CLINIC_OR_DEPARTMENT_OTHER): Payer: Medicare PPO

## 2015-04-11 VITALS — BP 144/80 | HR 67 | Temp 97.8°F | Resp 18 | Ht 70.0 in | Wt 205.0 lb

## 2015-04-11 DIAGNOSIS — C7B8 Other secondary neuroendocrine tumors: Secondary | ICD-10-CM

## 2015-04-11 DIAGNOSIS — E340 Carcinoid syndrome: Secondary | ICD-10-CM

## 2015-04-11 DIAGNOSIS — C7A8 Other malignant neuroendocrine tumors: Secondary | ICD-10-CM

## 2015-04-11 DIAGNOSIS — D499 Neoplasm of unspecified behavior of unspecified site: Secondary | ICD-10-CM

## 2015-04-11 LAB — CBC WITH DIFFERENTIAL (CANCER CENTER ONLY)
BASO#: 0.1 10*3/uL (ref 0.0–0.2)
BASO%: 0.9 % (ref 0.0–2.0)
EOS%: 6.5 % (ref 0.0–7.0)
Eosinophils Absolute: 0.4 10*3/uL (ref 0.0–0.5)
HEMATOCRIT: 40.7 % (ref 38.7–49.9)
HGB: 13.6 g/dL (ref 13.0–17.1)
LYMPH#: 1.1 10*3/uL (ref 0.9–3.3)
LYMPH%: 19.7 % (ref 14.0–48.0)
MCH: 28.6 pg (ref 28.0–33.4)
MCHC: 33.4 g/dL (ref 32.0–35.9)
MCV: 86 fL (ref 82–98)
MONO#: 0.4 10*3/uL (ref 0.1–0.9)
MONO%: 7.9 % (ref 0.0–13.0)
NEUT#: 3.6 10*3/uL (ref 1.5–6.5)
NEUT%: 65 % (ref 40.0–80.0)
Platelets: 157 10*3/uL (ref 145–400)
RBC: 4.75 10*6/uL (ref 4.20–5.70)
RDW: 14.7 % (ref 11.1–15.7)
WBC: 5.6 10*3/uL (ref 4.0–10.0)

## 2015-04-11 LAB — COMPREHENSIVE METABOLIC PANEL (CC13)
ALK PHOS: 69 U/L (ref 40–150)
ALT: 16 U/L (ref 0–55)
AST: 30 U/L (ref 5–34)
Albumin: 3.4 g/dL — ABNORMAL LOW (ref 3.5–5.0)
Anion Gap: 10 mEq/L (ref 3–11)
BUN: 14.2 mg/dL (ref 7.0–26.0)
CALCIUM: 9.5 mg/dL (ref 8.4–10.4)
CO2: 26 mEq/L (ref 22–29)
Chloride: 105 mEq/L (ref 98–109)
Creatinine: 0.8 mg/dL (ref 0.7–1.3)
EGFR: >90 mL/min/{1.73_m2} (ref >90)
Glucose: 73 mg/dl (ref 70–140)
Potassium: 4.1 mEq/L (ref 3.5–5.1)
Sodium: 142 mEq/L (ref 136–145)
Total Bilirubin: 0.81 mg/dL (ref 0.20–1.20)
Total Protein: 7.7 g/dL (ref 6.4–8.3)

## 2015-04-11 MED ORDER — OCTREOTIDE ACETATE 30 MG IM KIT
30.0000 mg | PACK | Freq: Once | INTRAMUSCULAR | Status: AC
  Administered 2015-04-11: 30 mg via INTRAMUSCULAR

## 2015-04-11 MED ORDER — OCTREOTIDE ACETATE 30 MG IM KIT
PACK | INTRAMUSCULAR | Status: AC
  Filled 2015-04-11: qty 1

## 2015-04-11 NOTE — Progress Notes (Signed)
Hematology and Oncology Follow Up Visit  Brian Mays [id number] [birth year] 69 y.o. 04/11/2015   Principle Diagnosis:  Metastatic low grade neuroendocrine tumor-hepatic metastases  Current Therapy:    S/p second yttrium-90 intrahepatic therapy-May 2015  Sandostatin LAR 30 mg IM every month    Interim History:  Brian Mays is here today for a follow-up. He is doing well on the Somatuline injections.  His last Chromogranin A in April was 42. We rechecked this today and are awaiting results.  CT scans in March showed stable disease. He has had no problem with infections. He denies fever, chills, n/v, cough, rash, headache, dizziness, SOB, chest pain, palpitations, abdominal pain, constipation, diarrhea, blood in urine or stool.  No swelling, tenderness, numbness or tingling in his extremities. No new aches or pains.  His is still working part time.  He is eating well and staying hydrated. His weight is stable.  He is walking and playing golf. He is trying to lose some weight and is down 3 lbs so far.   Medications:    Medication List       This list is accurate as of: 04/11/15  2:18 PM.  Always use your most recent med list.               amLODipine 5 MG tablet  Commonly known as:  NORVASC  Take 5 mg by mouth every morning.     aspirin EC 81 MG tablet  Take 81 mg by mouth every morning.     Cholecalciferol 1000 UNITS tablet  Take 1,000 Units by mouth daily.     ergocalciferol 50000 UNITS capsule  Commonly known as:  VITAMIN D2  Take 1 capsule (50,000 Units total) by mouth once a week.     fluticasone 50 MCG/ACT nasal spray  Commonly known as:  FLONASE  Place 2 sprays into both nostrils daily.     hydrocortisone 2.5 % rectal cream  Commonly known as:  PROCTOZONE-HC  Place 1 application rectally 2 (two) times daily.     hydrocortisone 25 MG suppository  Commonly known as:  ANUSOL-HC  INSERT 1 SUPPOSITORY RECTALLY TWICE A DAY AS NEEDED FOR HEMORRHOIDS     lansoprazole 30 MG capsule  Commonly known as:  PREVACID  Take 1 capsule (30 mg total) by mouth daily at 12 noon.     levothyroxine 50 MCG tablet  Commonly known as:  SYNTHROID, LEVOTHROID  Take 50 mcg by mouth daily before breakfast.     lipase/protease/amylase 12000 UNITS Cpep capsule  Commonly known as:  CREON  Take 1 capsule by mouth 3 (three) times daily before meals.     losartan 100 MG tablet  Commonly known as:  COZAAR  TAKE 1 TABLET BY MOUTH EVERY MORNING     PARoxetine 10 MG tablet  Commonly known as:  PAXIL  Take 1 tablet (10 mg total) by mouth daily.     SANDOSTATIN IJ  Inject 1 application as directed every 28 (twenty-eight) days. Pt is followed by Dr. Marin Olp office     sodium chloride 0.65 % Soln nasal spray  Commonly known as:  OCEAN  Place 1 spray into both nostrils as needed for congestion.     vitamin B-12 500 MCG tablet  Commonly known as:  CYANOCOBALAMIN  Take 500 mcg by mouth 2 (two) times daily.        Allergies:  Allergies  Allergen Reactions  . Iohexol      Code: HIVES, Desc: PER [name] @  [facility], PT IS ALLERGIC TO CONTRAST DYE 10/02/08/RM  05/01/10...needs full premeds per our protocol w/ [facility]., Onset Date: [id number]     Past Medical History, Surgical history, Social history, and Family History were reviewed and updated.  Review of Systems: All other 10 point review of systems is negative.   Physical Exam:  height is 5\' 10"  (1.778 m) and weight is 205 lb (92.987 kg). His oral temperature is 97.8 F (36.6 C). His blood pressure is 144/80 and his pulse is 67. His respiration is 18.   Wt Readings from Last 3 Encounters:  04/11/15 205 lb (92.987 kg)  03/14/15 208 lb (94.348 kg)  03/06/15 209 lb (94.802 kg)    Ocular: Sclerae unicteric, pupils equal, round and reactive to light Ear-nose-throat: Oropharynx clear, dentition fair Lymphatic: No cervical or supraclavicular adenopathy Lungs no rales or rhonchi, good excursion  bilaterally Heart regular rate and rhythm, no murmur appreciated Abd soft, nontender, positive bowel sounds MSK no focal spinal tenderness, no joint edema Neuro: non-focal, well-oriented, appropriate affect Breasts: Deferred  Lab Results  Component Value Date   WBC 5.6 04/11/2015   HGB 13.6 04/11/2015   HCT 40.7 04/11/2015   MCV 86 04/11/2015   PLT 157 04/11/2015   No results found for: FERRITIN, IRON, TIBC, UIBC, IRONPCTSAT Lab Results  Component Value Date   RBC 4.75 04/11/2015   No results found for: KPAFRELGTCHN, LAMBDASER, KAPLAMBRATIO No results found for: IGGSERUM, IGA, IGMSERUM No results found for: Kathrynn Ducking, MSPIKE, SPEI   Chemistry      Component Value Date/Time   NA 142 04/11/2015 1107   NA 141 03/07/2015 0824   NA 144 02/14/2015 0943   K 4.1 04/11/2015 1107   K 4.3 03/07/2015 0824   K 4.1 02/14/2015 0943   CL 103 03/07/2015 0824   CL 103 02/14/2015 0943   CO2 26 04/11/2015 1107   CO2 31 03/07/2015 0824   CO2 31 02/14/2015 0943   BUN 14.2 04/11/2015 1107   BUN 13 03/07/2015 0824   BUN 11 02/14/2015 0943   CREATININE 0.8 04/11/2015 1107   CREATININE 0.86 03/07/2015 0824   CREATININE 0.8 02/14/2015 0943      Component Value Date/Time   CALCIUM 9.5 04/11/2015 1107   CALCIUM 9.3 03/07/2015 0824   CALCIUM 9.5 02/14/2015 0943   ALKPHOS 69 04/11/2015 1107   ALKPHOS 65 03/07/2015 0824   ALKPHOS 62 02/14/2015 0943   AST 30 04/11/2015 1107   AST 22 03/07/2015 0824   AST 30 02/14/2015 0943   ALT 16 04/11/2015 1107   ALT 16 03/07/2015 0824   ALT 23 02/14/2015 0943   BILITOT 0.81 04/11/2015 1107   BILITOT 0.5 03/07/2015 0824   BILITOT 0.70 02/14/2015 0943     Impression and Plan: Brian Mays is 69 year old gentleman with a neuroendocrine carcinoma and liver metastases. He had a second intrahepatic therapy with yttrium-90 back in May 2015. Now he is on Somatuline injections monthly and doing well.  His most  recent CT scan showed stable disease. He is asymptomatic at this time and enjoying playing golf.  We will proceed with his Somatuline injection today as planned. We are also awaiting results of today's Chromogranin A test.  We will plan to see him back in 2 months for labs and follow-up. He knows to contact us with any questions or concerns. We can certainly see him sooner if need be.   Eliezer Bottom, NP 6/10/20162:18 PM

## 2015-04-11 NOTE — Patient Instructions (Signed)

## 2015-04-16 LAB — CHROMOGRANIN A: CHROMOGRANIN A: 34 ng/mL — AB (ref <=15)

## 2015-05-02 ENCOUNTER — Encounter: Payer: Self-pay | Admitting: Internal Medicine

## 2015-05-16 ENCOUNTER — Other Ambulatory Visit: Payer: Self-pay | Admitting: Internal Medicine

## 2015-05-26 ENCOUNTER — Telehealth: Payer: Self-pay | Admitting: Internal Medicine

## 2015-05-26 MED ORDER — LEVOTHYROXINE SODIUM 50 MCG PO TABS: 50.0000 ug | ORAL_TABLET | Freq: Every day | ORAL | Status: DC

## 2015-05-26 NOTE — Telephone Encounter (Signed)
Patient requesting refill for levothyroxine (SYNTHROID, LEVOTHROID) 50 MCG tablet [[id number]]  [facility] [address].

## 2015-05-26 NOTE — Telephone Encounter (Signed)
Done. See meds.  

## 2015-05-30 ENCOUNTER — Other Ambulatory Visit: Payer: Self-pay | Admitting: Internal Medicine

## 2015-06-09 ENCOUNTER — Telehealth: Payer: Self-pay | Admitting: Hematology & Oncology

## 2015-06-09 NOTE — Telephone Encounter (Signed)
[place]: [id number] Status: Approved  Dates: 06/09/2015-09/07/2015 CPT: Z7673

## 2015-06-10 IMAGING — US US EXTREM LOW VENOUS*R*
1 series · 13 of 24 positions shown · non-contrast
Comparison: None.

CLINICAL DATA: Intermittent pain beyond the right knee for 1 month.
History of malignancy. Evaluate for DVT.



[Series 1: us extrem low venous*right* · 0.09mm/px · 31 acquisitions, 13 frames shown]
[im 1/31]
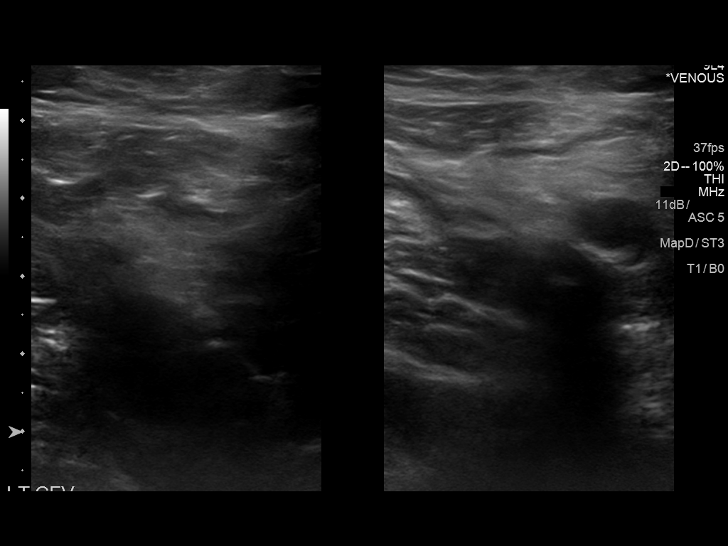
[im 3/31]
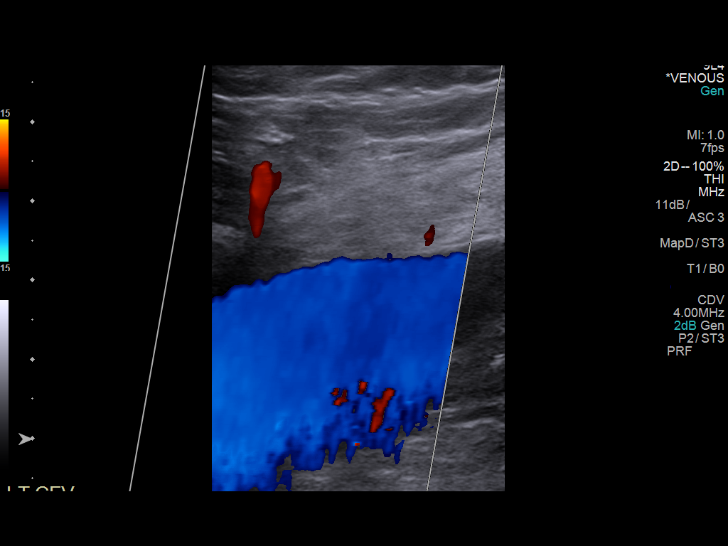
[im 6/31]
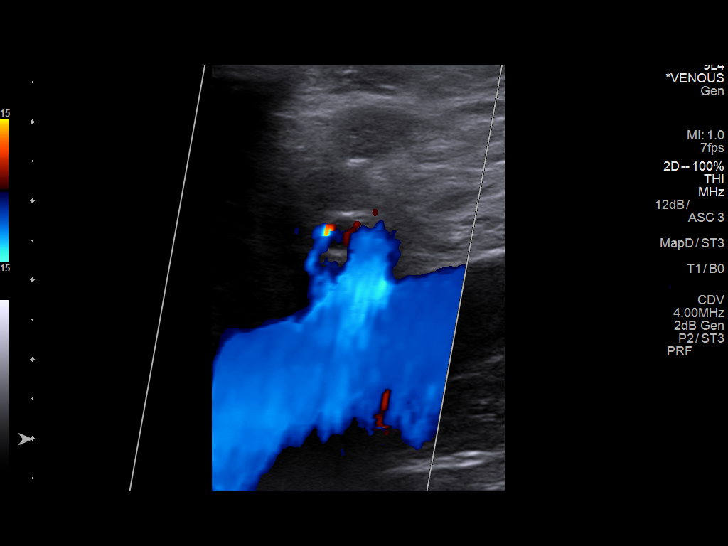
[im 8/31]
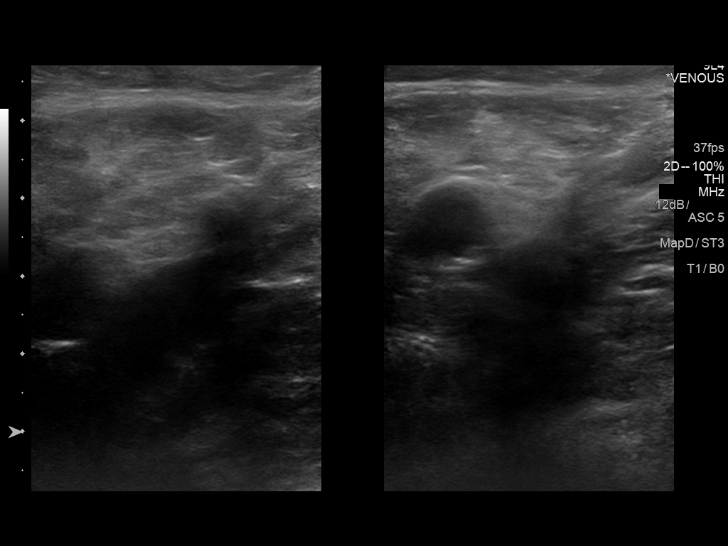
[im 11/31]
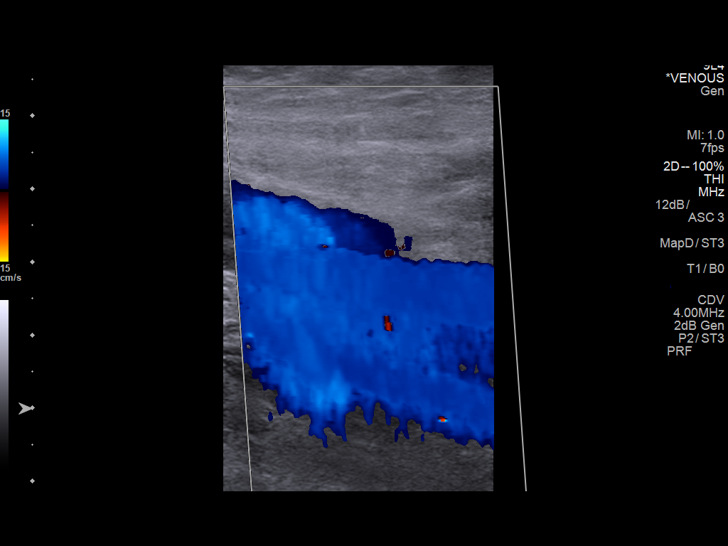
[im 14/31]
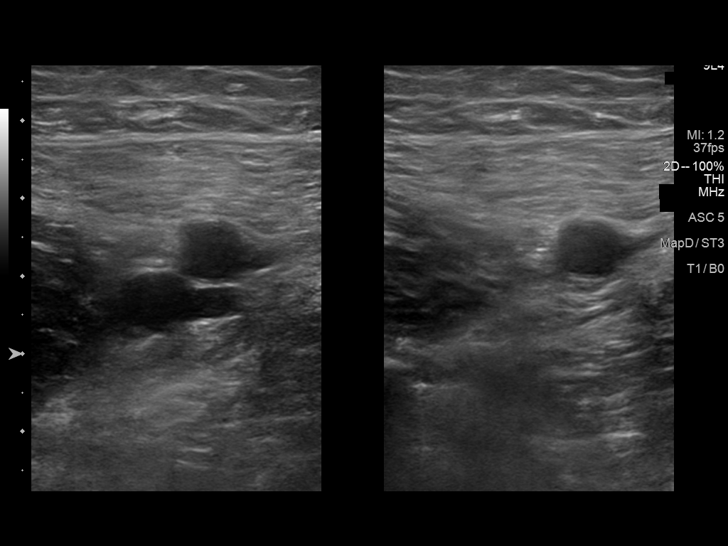
[im 17/31]
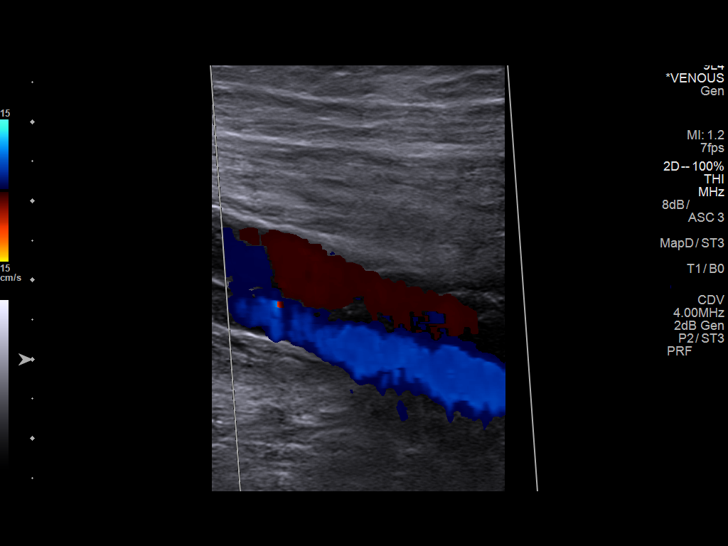
[im 19/31]
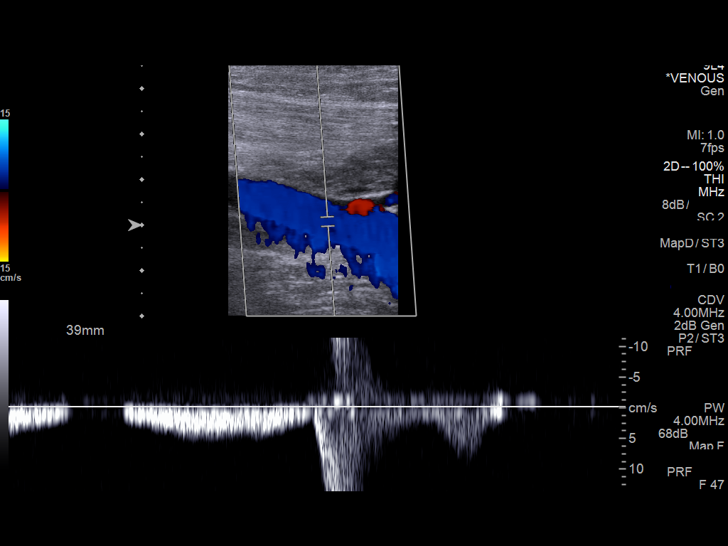
[im 21/31]
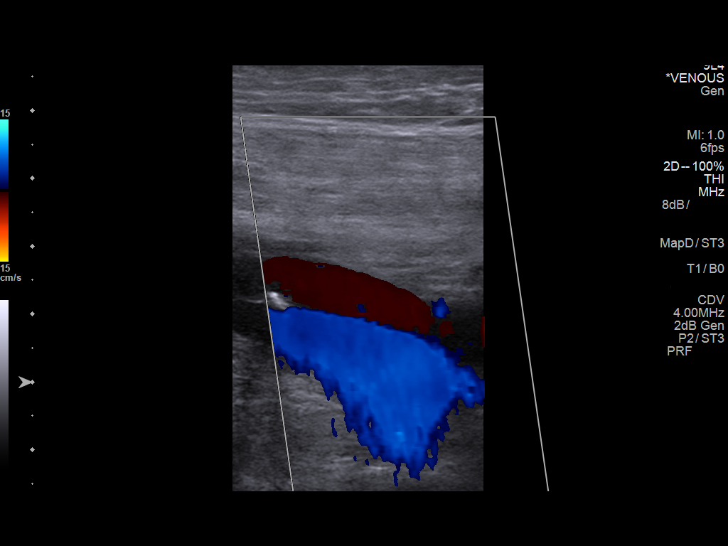
[im 24/31]
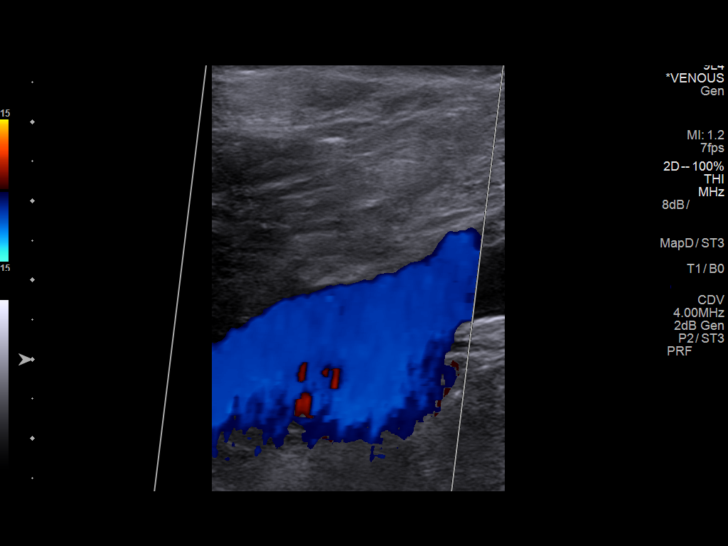
[im 27/31]
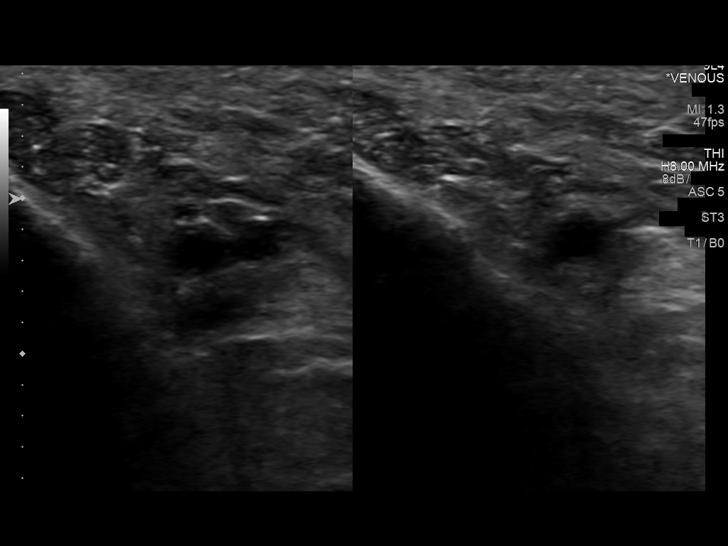
[im 28/31]
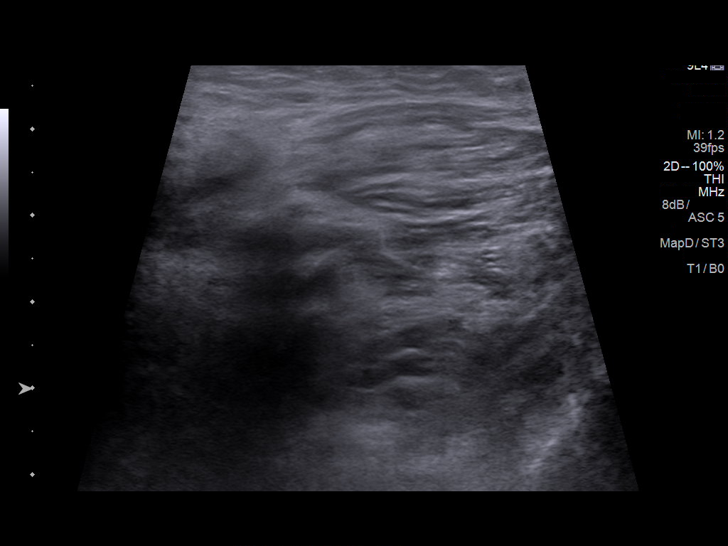
[im 31/31]
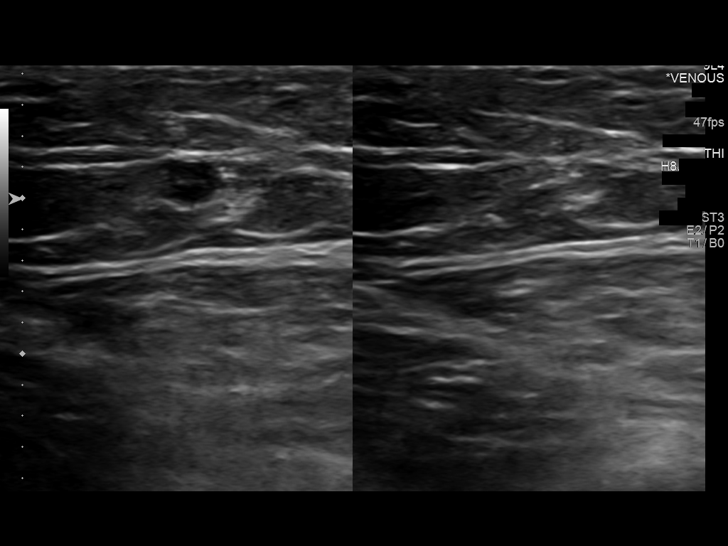

[13 of 24 positions shown; findings below may reference images not displayed]

FINDINGS: Contralateral Common Femoral Vein: Respiratory phasicity is normal
and symmetric with the symptomatic side. No evidence of thrombus.
Normal compressibility.

Common Femoral Vein: No evidence of thrombus. Normal
compressibility, respiratory phasicity and response to augmentation.

Saphenofemoral Junction: No evidence of thrombus. Normal
compressibility and flow on color Doppler imaging.

Profunda Femoral Vein: No evidence of thrombus. Normal
compressibility and flow on color Doppler imaging.

Femoral Vein: No evidence of thrombus. Normal compressibility,
respiratory phasicity and response to augmentation.

Popliteal Vein: No evidence of thrombus. Normal compressibility,
respiratory phasicity and response to augmentation.

Calf Veins: No evidence of thrombus. Normal compressibility and flow
on color Doppler imaging.

Superficial Great Saphenous Vein: No evidence of thrombus. Normal
compressibility and flow on color Doppler imaging.

Venous Reflux:  None.

Other Findings:  None.
IMPRESSION: No evidence of DVT within the right lower extremity.

## 2015-06-11 ENCOUNTER — Other Ambulatory Visit (HOSPITAL_BASED_OUTPATIENT_CLINIC_OR_DEPARTMENT_OTHER): Payer: Medicare PPO

## 2015-06-11 ENCOUNTER — Ambulatory Visit (HOSPITAL_BASED_OUTPATIENT_CLINIC_OR_DEPARTMENT_OTHER): Payer: Medicare PPO

## 2015-06-11 ENCOUNTER — Other Ambulatory Visit: Payer: Self-pay | Admitting: *Deleted

## 2015-06-11 ENCOUNTER — Encounter: Payer: Self-pay | Admitting: Hematology & Oncology

## 2015-06-11 ENCOUNTER — Ambulatory Visit (HOSPITAL_BASED_OUTPATIENT_CLINIC_OR_DEPARTMENT_OTHER): Payer: Medicare PPO | Admitting: Hematology & Oncology

## 2015-06-11 VITALS — BP 126/74 | HR 77 | Resp 20 | Ht 70.0 in | Wt 205.0 lb

## 2015-06-11 DIAGNOSIS — E340 Carcinoid syndrome: Secondary | ICD-10-CM | POA: Diagnosis not present

## 2015-06-11 DIAGNOSIS — C7B8 Other secondary neuroendocrine tumors: Secondary | ICD-10-CM

## 2015-06-11 DIAGNOSIS — M25561 Pain in right knee: Secondary | ICD-10-CM

## 2015-06-11 DIAGNOSIS — C7A8 Other malignant neuroendocrine tumors: Secondary | ICD-10-CM

## 2015-06-11 DIAGNOSIS — R1011 Right upper quadrant pain: Secondary | ICD-10-CM

## 2015-06-11 DIAGNOSIS — D499 Neoplasm of unspecified behavior of unspecified site: Secondary | ICD-10-CM

## 2015-06-11 LAB — CBC WITH DIFFERENTIAL (CANCER CENTER ONLY)
BASO#: 0 10*3/uL (ref 0.0–0.2)
BASO%: 0.5 % (ref 0.0–2.0)
EOS ABS: 0.4 10*3/uL (ref 0.0–0.5)
EOS%: 5.6 % (ref 0.0–7.0)
HCT: 40.3 % (ref 38.7–49.9)
HGB: 13.4 g/dL (ref 13.0–17.1)
LYMPH#: 1.3 10*3/uL (ref 0.9–3.3)
LYMPH%: 19.2 % (ref 14.0–48.0)
MCH: 28.3 pg (ref 28.0–33.4)
MCHC: 33.3 g/dL (ref 32.0–35.9)
MCV: 85 fL (ref 82–98)
MONO#: 0.5 10*3/uL (ref 0.1–0.9)
MONO%: 7.8 % (ref 0.0–13.0)
NEUT#: 4.4 10*3/uL (ref 1.5–6.5)
NEUT%: 66.9 % (ref 40.0–80.0)
Platelets: 186 10*3/uL (ref 145–400)
RBC: 4.73 10*6/uL (ref 4.20–5.70)
RDW: 14.5 % (ref 11.1–15.7)
WBC: 6.6 10*3/uL (ref 4.0–10.0)

## 2015-06-11 MED ORDER — PREDNISONE 50 MG PO TABS: ORAL_TABLET | ORAL | Status: DC

## 2015-06-11 MED ORDER — DIPHENHYDRAMINE HCL 50 MG PO TABS: 50.0000 mg | ORAL_TABLET | Freq: Once | ORAL | Status: DC

## 2015-06-11 MED ORDER — OCTREOTIDE ACETATE 30 MG IM KIT
PACK | INTRAMUSCULAR | Status: AC
  Filled 2015-06-11: qty 1

## 2015-06-11 MED ORDER — OCTREOTIDE ACETATE 30 MG IM KIT
30.0000 mg | PACK | Freq: Once | INTRAMUSCULAR | Status: AC
  Administered 2015-06-11: 30 mg via INTRAMUSCULAR

## 2015-06-11 NOTE — Progress Notes (Signed)
While administering first sandostatin dose, the syringe became hard to push. More pressure was placed on plunger, and syringe tube broke allowing the medication to fall to floor. Approximately half the dose was given to the patient. Spoke to Dr Marin Olp and he ordered for the full dose to be administered again. Second dose given.

## 2015-06-11 NOTE — Patient Instructions (Signed)

## 2015-06-11 NOTE — Progress Notes (Signed)
Hematology and Oncology Follow Up Visit  VIVIANO BIR [id number] 07-02-1946 69 y.o. 06/11/2015   Principle Diagnosis:  Metastatic low grade neuroendocrine tumor-hepatic metastases   Current Therapy:    S/p second yttrium-90 intrahepatic therapy-May 2015  Sandostatin LAR 30 mg IM every month     Interim History:  Mr.  Lichty is back for followup. He is having some more abdominal symptoms. He is having some more abdominal discomfort. He's having some pain in the right upper quadrant. This is begun on for about a month or so.  He did not have his Sandostatin last month. I'm not sure as to what exactly happened. It looks like an appointment was not put in for an injection last month. I apologized to he and his wife for this.  His last chromogranin A done in June was 34. We will have to see what the level is today.  He is complaining of some pain behind his right knee. I will get a sonogram to see if he has a Baker's cyst. There is no swelling in the right leg.  His appetite has been doing okay. He's had no nausea or vomiting. He's had no sweats. He's had no cough or shortness of breath. He's had no rashes.   Overall, his performance status is ECOG 1. Medications:  Current outpatient prescriptions:  .  amLODipine (NORVASC) 5 MG tablet, TAKE 1 TABLET DAILY, Disp: 30 tablet, Rfl: 11 .  aspirin EC 81 MG tablet, Take 81 mg by mouth every morning., Disp: , Rfl:  .  Cholecalciferol 1000 UNITS tablet, Take 1,000 Units by mouth daily.  , Disp: , Rfl:  .  CREON 24000 UNITS CPEP, TAKE ONE CAPSULE BY MOUTH EVERY DAY BEFORE MEALS, Disp: 30 capsule, Rfl: 11 .  doxycycline (VIBRAMYCIN) 100 MG capsule, TAKE 1 CAPSULE BY MOUTH TWICE A DAY WITH FOOD AND FULL GLASS OF WATER, Disp: , Rfl: 1 .  ergocalciferol (VITAMIN D2) 50000 UNITS capsule, Take 1 capsule (50,000 Units total) by mouth once a week., Disp: 6 capsule, Rfl: 0 .  fluocinonide cream (LIDEX) 0.05 %, APPLY TWICE DAILY TO AFFECTED AREAS AS  NEEDED. NOT TO FACE, GROIN OR UNDERARMS, Disp: , Rfl: 2 .  fluticasone (FLONASE) 50 MCG/ACT nasal spray, Place 2 sprays into both nostrils daily. , Disp: , Rfl: 11 .  hydrocortisone (ANUSOL-HC) 25 MG suppository, INSERT 1 SUPPOSITORY RECTALLY TWICE A DAY AS NEEDED FOR HEMORRHOIDS, Disp: 30 suppository, Rfl: 0 .  hydrocortisone (PROCTOZONE-HC) 2.5 % rectal cream, Place 1 application rectally 2 (two) times daily. (Patient taking differently: Place 1 application rectally as needed. ), Disp: 30 g, Rfl: 4 .  lansoprazole (PREVACID) 30 MG capsule, Take 1 capsule (30 mg total) by mouth daily at 12 noon., Disp: 30 capsule, Rfl: 11 .  levothyroxine (SYNTHROID, LEVOTHROID) 50 MCG tablet, Take 1 tablet (50 mcg total) by mouth daily before breakfast., Disp: 90 tablet, Rfl: 3 .  lipase/protease/amylase (CREON-12/PANCREASE) 12000 UNITS CPEP capsule, Take 1 capsule by mouth 3 (three) times daily before meals., Disp: , Rfl:  .  losartan (COZAAR) 100 MG tablet, TAKE 1 TABLET BY MOUTH EVERY MORNING, Disp: 90 tablet, Rfl: 3 .  Octreotide Acetate (SANDOSTATIN IJ), Inject 1 application as directed every 28 (twenty-eight) days. Pt is followed by Dr. Marin Olp office, Disp: , Rfl:  .  PARoxetine (PAXIL) 10 MG tablet, Take 1 tablet (10 mg total) by mouth daily., Disp: 30 tablet, Rfl: 3 .  sodium chloride (OCEAN) 0.65 % SOLN nasal spray, Place  1 spray into both nostrils as needed for congestion. , Disp: , Rfl:  .  vitamin B-12 (CYANOCOBALAMIN) 500 MCG tablet, Take 500 mcg by mouth 2 (two) times daily., Disp: , Rfl:  .  diphenhydrAMINE (BENADRYL) 50 MG tablet, Take 1 tablet (50 mg total) by mouth once. Take one hour before scan, Disp: 10 tablet, Rfl: 2 .  predniSONE (DELTASONE) 50 MG tablet, Take 1 tablet 13h, 7h and 1h before CT scan., Disp: 3 tablet, Rfl: 2  Allergies:  Allergies  Allergen Reactions  . Iohexol      Code: HIVES, Desc: PER [name] @ [facility], PT IS ALLERGIC TO CONTRAST DYE 10/02/08/RM  05/01/10...needs full  premeds per our protocol w/ [facility]., Onset Date: [id number]     Past Medical History, Surgical history, Social history, and Family History were reviewed and updated.  Review of Systems: As above  Physical Exam:  height is 5\' 10"  (1.778 m) and weight is 205 lb (92.987 kg). His blood pressure is 126/74 and his pulse is 77. His respiration is 20.   Well-developed and well-nourished white gentleman. Head and exam shows no ocular or oral lesions. There are no palpable cervical or supraclavicular lymph nodes. Thyroid is not palpable. Lungs are clear. Cardiac exam is regular rate and rhythm with a normal S1 and S2. He has no murmurs, rubs or bruits.. Abdomen is soft. He has a well-healed laparotomy scar. There is no fluid wave. There is no guarding. He is no palpable liver or spleen tip. Back exam shows no tenderness over the spine, ribs or hips. Extremities shows no clubbing, cyanosis or edema. Skin exam no rashes. Neurological exam is nonfocal. Lab Results  Component Value Date   WBC 6.6 06/11/2015   HGB 13.4 06/11/2015   HCT 40.3 06/11/2015   MCV 85 06/11/2015   PLT 186 06/11/2015     Chemistry      Component Value Date/Time   NA 142 04/11/2015 1107   NA 141 03/07/2015 0824   NA 144 02/14/2015 0943   K 4.1 04/11/2015 1107   K 4.3 03/07/2015 0824   K 4.1 02/14/2015 0943   CL 103 03/07/2015 0824   CL 103 02/14/2015 0943   CO2 26 04/11/2015 1107   CO2 31 03/07/2015 0824   CO2 31 02/14/2015 0943   BUN 14.2 04/11/2015 1107   BUN 13 03/07/2015 0824   BUN 11 02/14/2015 0943   CREATININE 0.8 04/11/2015 1107   CREATININE 0.86 03/07/2015 0824   CREATININE 0.8 02/14/2015 0943      Component Value Date/Time   CALCIUM 9.5 04/11/2015 1107   CALCIUM 9.3 03/07/2015 0824   CALCIUM 9.5 02/14/2015 0943   ALKPHOS 69 04/11/2015 1107   ALKPHOS 65 03/07/2015 0824   ALKPHOS 62 02/14/2015 0943   AST 30 04/11/2015 1107   AST 22 03/07/2015 0824   AST 30 02/14/2015 0943   ALT 16 04/11/2015  1107   ALT 16 03/07/2015 0824   ALT 23 02/14/2015 0943   BILITOT 0.81 04/11/2015 1107   BILITOT 0.5 03/07/2015 0824   BILITOT 0.70 02/14/2015 0943         Impression and Plan: Mr. Dills is 69 year old gentleman with a neuroendocrine carcinoma. He has liver metastases. He had a second intrahepatic therapy with yttrium-90 back in May 2015.   I will set him up with a ultrasound of his right knee. It sounds like it might be a Baker's cyst echo because him some pain.  I also want to  get a CT of his abdomen and pelvis we see him back. Just from his symptoms, I want to make sure that he does not have progressive disease.  If he does have disease progression, then we will have to figure out what options we consider consider. It would be nice if the FDA approved the new radioisotope therapy- Lutathera. I think this would be something that would be very interesting to use. I know that we could also use systemic therapy which would, unfortunately, have more side effects.  I will see him back in one month.    Volanda Napoleon, MD 8/10/20165:43 PM

## 2015-06-12 ENCOUNTER — Ambulatory Visit (HOSPITAL_BASED_OUTPATIENT_CLINIC_OR_DEPARTMENT_OTHER)
Admission: RE | Admit: 2015-06-12 | Discharge: 2015-06-12 | Disposition: A | Discharge status: Home / Self Care | Payer: Medicare PPO | Source: Ambulatory Visit | Attending: Hematology & Oncology | Admitting: Hematology & Oncology

## 2015-06-12 DIAGNOSIS — M79661 Pain in right lower leg: Secondary | ICD-10-CM | POA: Diagnosis present

## 2015-06-12 DIAGNOSIS — C7B8 Other secondary neuroendocrine tumors: Secondary | ICD-10-CM

## 2015-06-12 DIAGNOSIS — M25561 Pain in right knee: Secondary | ICD-10-CM

## 2015-06-16 LAB — COMPREHENSIVE METABOLIC PANEL
ALBUMIN: 3.6 g/dL (ref 3.6–5.1)
ALK PHOS: 54 U/L (ref 40–115)
ALT: 14 U/L (ref 9–46)
AST: 24 U/L (ref 10–35)
BUN: 17 mg/dL (ref 7–25)
CALCIUM: 9.3 mg/dL (ref 8.6–10.3)
CHLORIDE: 104 mmol/L (ref 98–110)
CO2: 27 mmol/L (ref 20–31)
Creatinine, Ser: 0.84 mg/dL (ref 0.70–1.25)
GLUCOSE: 117 mg/dL — AB (ref 65–99)
POTASSIUM: 4.1 mmol/L (ref 3.5–5.3)
SODIUM: 139 mmol/L (ref 135–146)
Total Bilirubin: 0.6 mg/dL (ref 0.2–1.2)
Total Protein: 7.2 g/dL (ref 6.1–8.1)

## 2015-06-16 LAB — CHROMOGRANIN A: Chromogranin A: 96 ng/mL — ABNORMAL HIGH (ref <=15)

## 2015-06-17 ENCOUNTER — Telehealth: Payer: Self-pay | Admitting: Emergency Medicine

## 2015-06-17 NOTE — Telephone Encounter (Signed)
-----   Message from Volanda Napoleon, MD sent at 06/16/2015 11:59 AM EDT ----- Call - No obvious problem in the right leg!! pete

## 2015-06-17 NOTE — Telephone Encounter (Signed)
Spoke with patient; informed him that [place] of right lower extremity was negative for clot. Patient states pain is improving; states some difficultly with driving long distances or riding on the lawn mower. Advised patient to call with any questions or concerns and to return as previously scheduled in September.

## 2015-06-25 ENCOUNTER — Other Ambulatory Visit: Payer: Self-pay | Admitting: Dermatology

## 2015-07-02 ENCOUNTER — Encounter: Payer: Self-pay | Admitting: Internal Medicine

## 2015-07-03 ENCOUNTER — Other Ambulatory Visit: Payer: Self-pay | Admitting: Internal Medicine

## 2015-07-03 MED ORDER — PANCRELIPASE (LIP-PROT-AMYL) 24000-76000 UNITS PO CPEP: ORAL_CAPSULE | ORAL | Status: DC

## 2015-07-07 ENCOUNTER — Encounter: Payer: Self-pay | Admitting: Hematology & Oncology

## 2015-07-09 ENCOUNTER — Other Ambulatory Visit: Payer: Self-pay | Admitting: Hematology & Oncology

## 2015-07-14 ENCOUNTER — Other Ambulatory Visit (HOSPITAL_BASED_OUTPATIENT_CLINIC_OR_DEPARTMENT_OTHER): Payer: Medicare PPO

## 2015-07-14 ENCOUNTER — Encounter: Payer: Self-pay | Admitting: Hematology & Oncology

## 2015-07-14 ENCOUNTER — Ambulatory Visit (HOSPITAL_BASED_OUTPATIENT_CLINIC_OR_DEPARTMENT_OTHER): Payer: Medicare PPO | Admitting: Hematology & Oncology

## 2015-07-14 ENCOUNTER — Encounter (HOSPITAL_BASED_OUTPATIENT_CLINIC_OR_DEPARTMENT_OTHER): Payer: Self-pay

## 2015-07-14 ENCOUNTER — Ambulatory Visit (HOSPITAL_BASED_OUTPATIENT_CLINIC_OR_DEPARTMENT_OTHER)
Admission: RE | Admit: 2015-07-14 | Discharge: 2015-07-14 | Disposition: A | Discharge status: Home / Self Care | Payer: Medicare PPO | Source: Ambulatory Visit | Attending: Hematology & Oncology | Admitting: Hematology & Oncology

## 2015-07-14 ENCOUNTER — Ambulatory Visit (HOSPITAL_BASED_OUTPATIENT_CLINIC_OR_DEPARTMENT_OTHER): Payer: Medicare PPO

## 2015-07-14 VITALS — BP 145/91 | HR 87 | Temp 98.0°F | Resp 16 | Ht 70.0 in | Wt 206.0 lb

## 2015-07-14 DIAGNOSIS — N429 Disorder of prostate, unspecified: Secondary | ICD-10-CM | POA: Diagnosis not present

## 2015-07-14 DIAGNOSIS — C7B8 Other secondary neuroendocrine tumors: Secondary | ICD-10-CM

## 2015-07-14 DIAGNOSIS — C7A00 Malignant carcinoid tumor of unspecified site: Secondary | ICD-10-CM | POA: Diagnosis present

## 2015-07-14 DIAGNOSIS — J9811 Atelectasis: Secondary | ICD-10-CM | POA: Insufficient documentation

## 2015-07-14 DIAGNOSIS — Z23 Encounter for immunization: Secondary | ICD-10-CM

## 2015-07-14 DIAGNOSIS — C7B02 Secondary carcinoid tumors of liver: Secondary | ICD-10-CM | POA: Diagnosis not present

## 2015-07-14 DIAGNOSIS — M25561 Pain in right knee: Secondary | ICD-10-CM

## 2015-07-14 DIAGNOSIS — K4020 Bilateral inguinal hernia, without obstruction or gangrene, not specified as recurrent: Secondary | ICD-10-CM | POA: Insufficient documentation

## 2015-07-14 DIAGNOSIS — Z0000 Encounter for general adult medical examination without abnormal findings: Secondary | ICD-10-CM

## 2015-07-14 LAB — CBC WITH DIFFERENTIAL (CANCER CENTER ONLY)
BASO#: 0 10*3/uL (ref 0.0–0.2)
BASO%: 0.1 % (ref 0.0–2.0)
EOS%: 0 % (ref 0.0–7.0)
Eosinophils Absolute: 0 10*3/uL (ref 0.0–0.5)
HCT: 41.1 % (ref 38.7–49.9)
HGB: 13.4 g/dL (ref 13.0–17.1)
LYMPH#: 0.7 10*3/uL — ABNORMAL LOW (ref 0.9–3.3)
LYMPH%: 7.9 % — AB (ref 14.0–48.0)
MCH: 27.7 pg — AB (ref 28.0–33.4)
MCHC: 32.6 g/dL (ref 32.0–35.9)
MCV: 85 fL (ref 82–98)
MONO#: 0.1 10*3/uL (ref 0.1–0.9)
MONO%: 0.7 % (ref 0.0–13.0)
NEUT#: 8 10*3/uL — ABNORMAL HIGH (ref 1.5–6.5)
NEUT%: 91.3 % — ABNORMAL HIGH (ref 40.0–80.0)
PLATELETS: 169 10*3/uL (ref 145–400)
RBC: 4.84 10*6/uL (ref 4.20–5.70)
RDW: 14.7 % (ref 11.1–15.7)
WBC: 8.8 10*3/uL (ref 4.0–10.0)

## 2015-07-14 LAB — CMP (CANCER CENTER ONLY)
ALBUMIN: 3.3 g/dL (ref 3.3–5.5)
ALT(SGPT): 21 U/L (ref 10–47)
AST: 32 U/L (ref 11–38)
Alkaline Phosphatase: 57 U/L (ref 26–84)
BUN, Bld: 14 mg/dL (ref 7–22)
CALCIUM: 8.7 mg/dL (ref 8.0–10.3)
CHLORIDE: 100 meq/L (ref 98–108)
CO2: 28 meq/L (ref 18–33)
Creat: 1 mg/dl (ref 0.6–1.2)
Glucose, Bld: 131 mg/dL — ABNORMAL HIGH (ref 73–118)
Potassium: 4.1 mEq/L (ref 3.3–4.7)
SODIUM: 140 meq/L (ref 128–145)
Total Bilirubin: 0.8 mg/dl (ref 0.20–1.60)
Total Protein: 8 g/dL (ref 6.4–8.1)

## 2015-07-14 IMAGING — CT CT ABD-PELV W/ CM
2 of 5 series · 16 of 46 positions shown, 18 images · IV contrast (omnipaque)
Comparison: CT chest abdomen pelvis [DATE]

CLINICAL DATA: Patient with history of neuroendocrine carcinoma
with hepatic metastasis.

EXAM:
CT ABDOMEN AND PELVIS WITH CONTRAST
TECHNIQUE: Multidetector CT imaging of the abdomen and pelvis was performed
using the standard protocol following bolus administration of
intravenous contrast.
CONTRAST:  100mL OMNIPAQUE IOHEXOL 300 MG/ML  SOLN

[Series 2: abd/pelvis 5.0 b31f · axial · 0.81mm/px · z∈[-701,-251]mm · 13 of 101 slices shown, 15 images]
[im 6/101  soft-tissue]
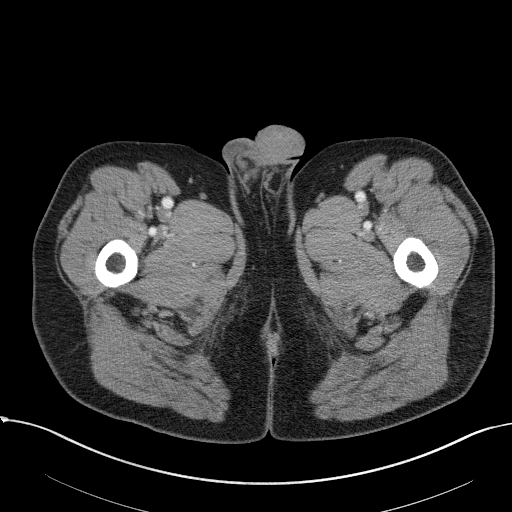
[im 6/101  bone]
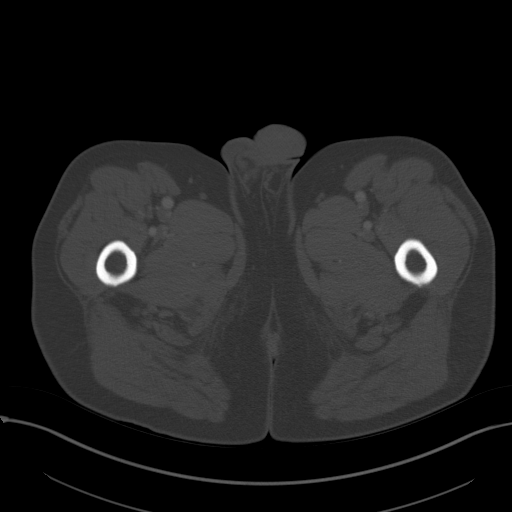
[im 16/101  soft-tissue]
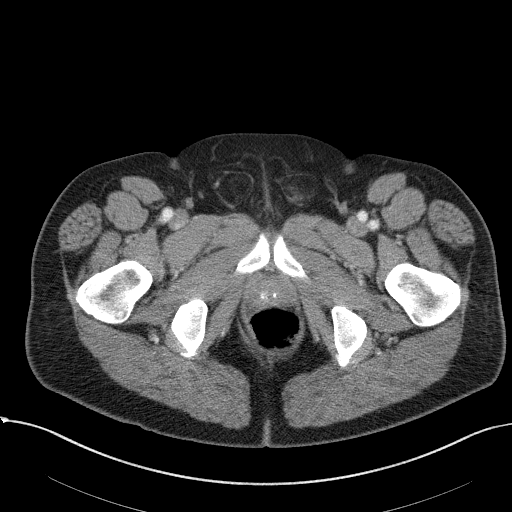
[im 21/101  soft-tissue]
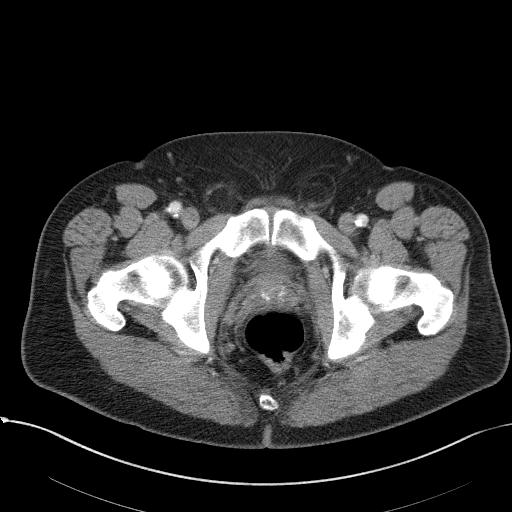
[im 31/101  soft-tissue]
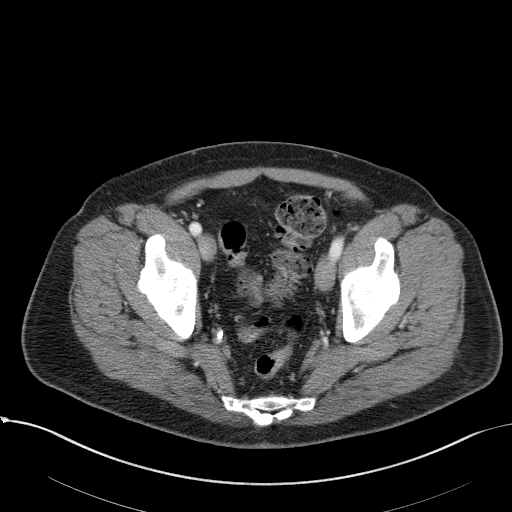
[im 36/101  soft-tissue]
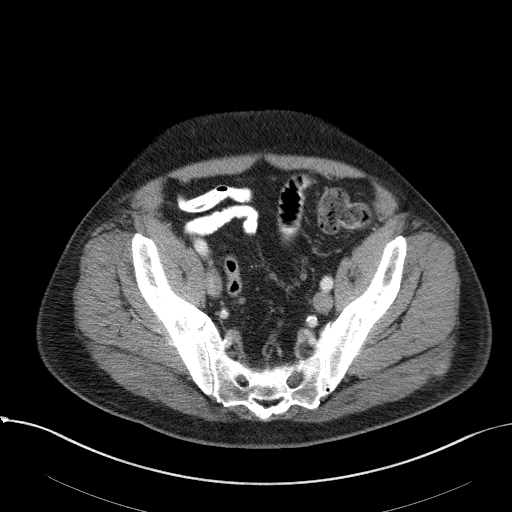
[im 46/101  soft-tissue]
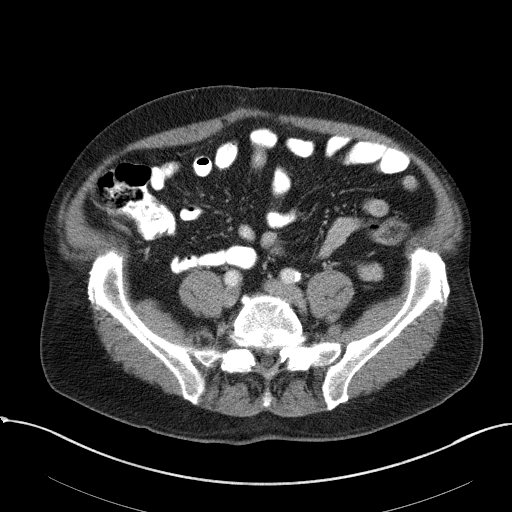
[im 51/101  soft-tissue]
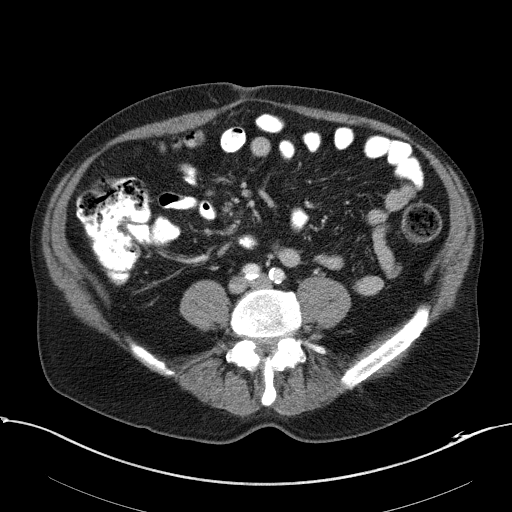
[im 56/101  soft-tissue]
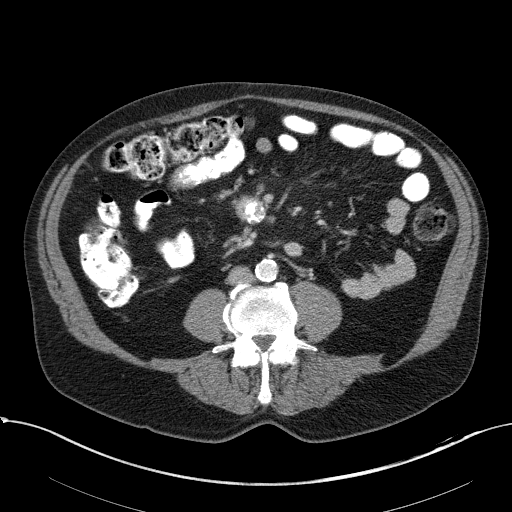
[im 66/101  soft-tissue]
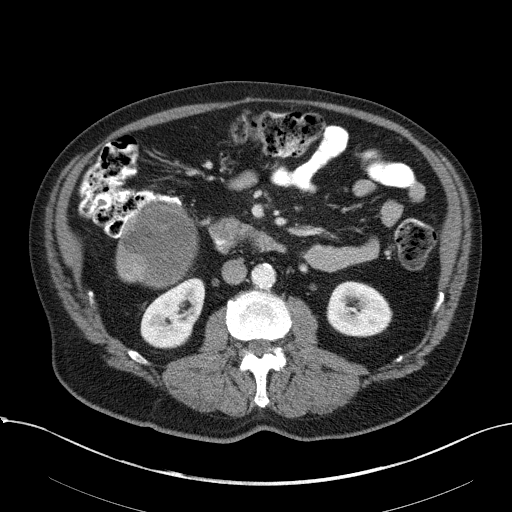
[im 66/101  bone]
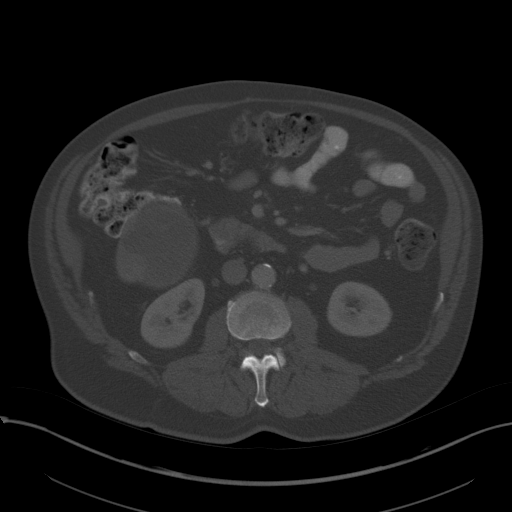
[im 71/101  soft-tissue]
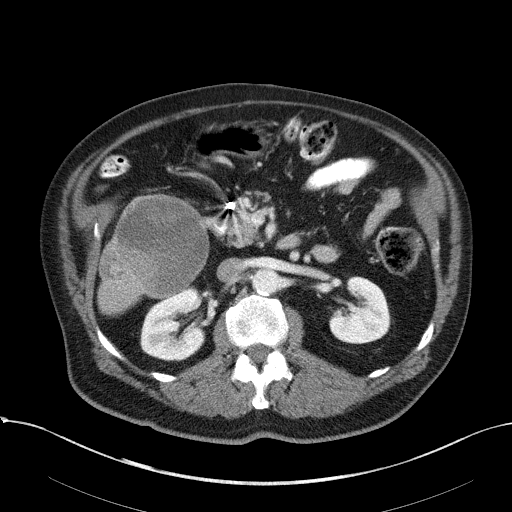
[im 81/101  soft-tissue]
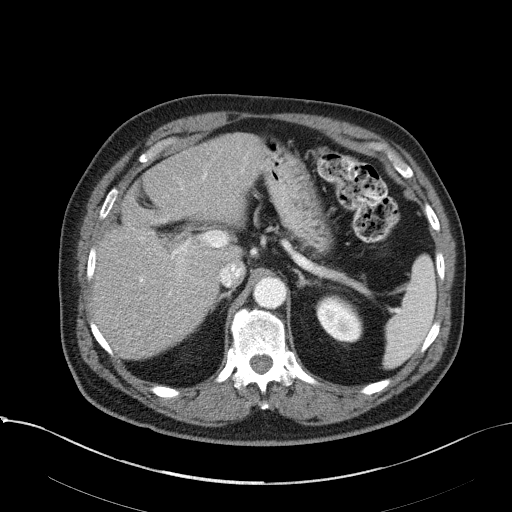
[im 86/101  soft-tissue]
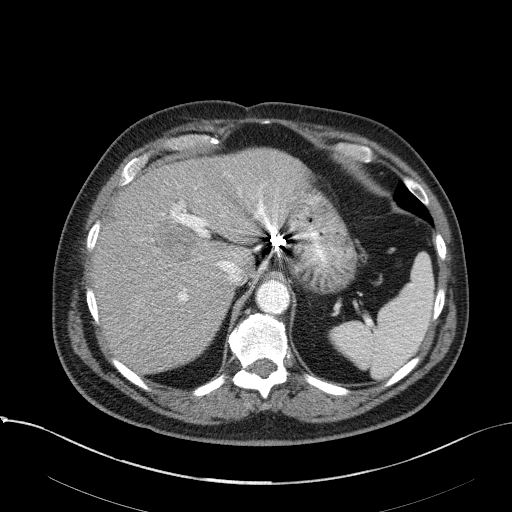
[im 96/101  soft-tissue]
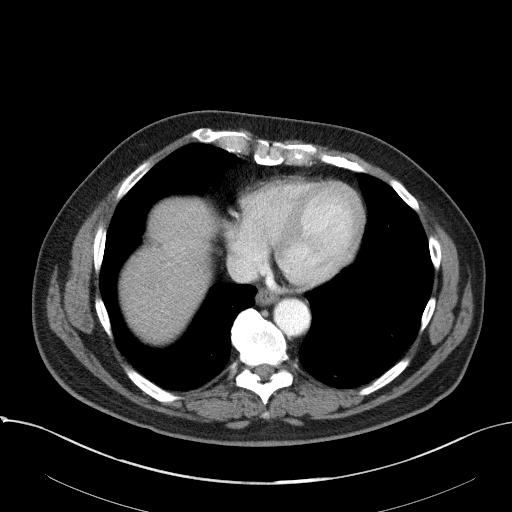

[Series 5: abd/pelvis 3.0 coronal · coronal · 0.83mm/px · 3 of 100 slices shown]
[im 34/100  soft-tissue]
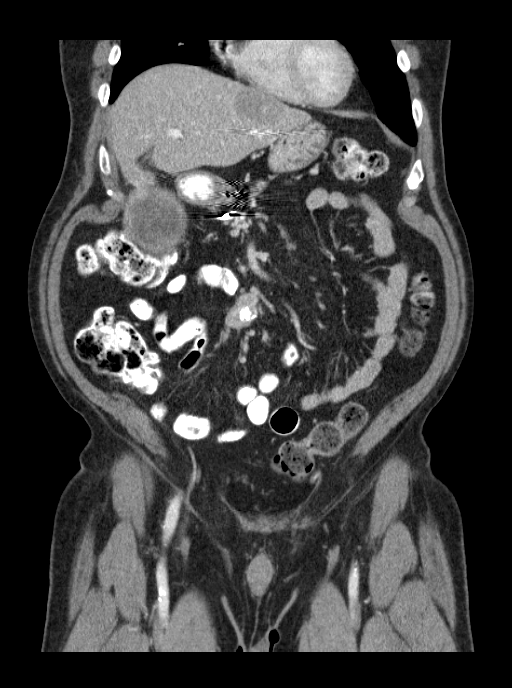
[im 45/100  soft-tissue]
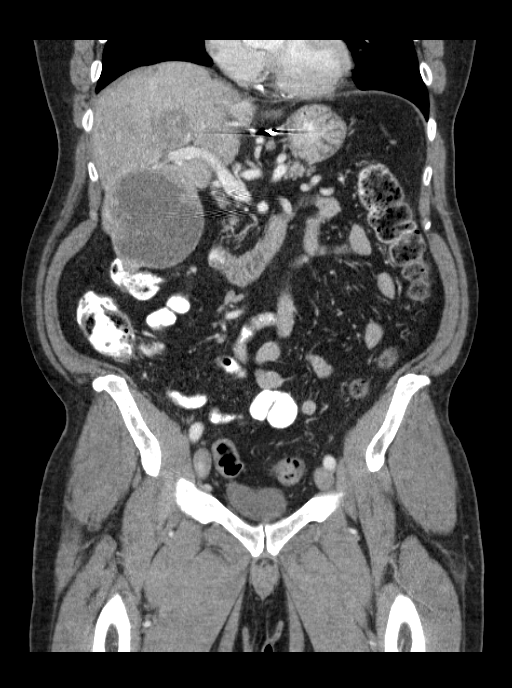
[im 56/100  soft-tissue]
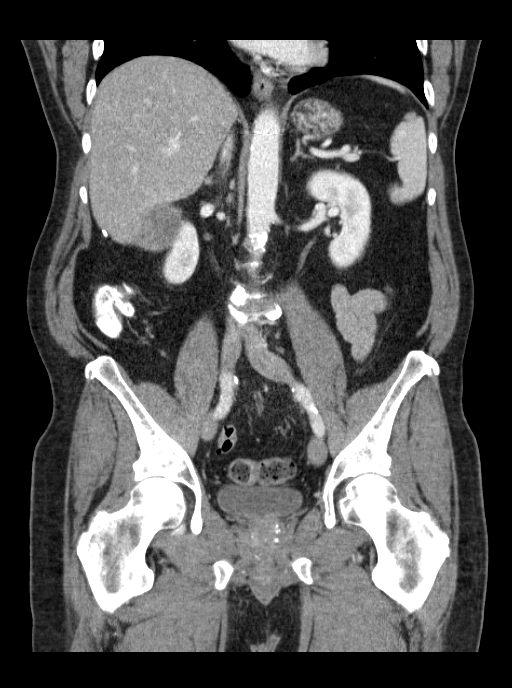

[16 of 46 positions shown; findings below may reference images not displayed]

FINDINGS: Lower chest: Dependent atelectasis within the bilateral lower lobes.
No pleural effusion. Heart size.

Hepatobiliary: Left hepatic lobe mass is unchanged measuring 4.4 x
3.2 cm, previously 4.3 x 3.3 cm (image 13; series 2). Inferior right
hepatic lobe cystic and solid mass is unchanged measuring 8.1 x
cm (image 32; series 2), previously 8.0 x 9.2 cm. Slight interval
increase in size of right hepatic lobe mass measuring 3.5 x 2.3 cm
(image 14; series 2), previously 2.3 x 1.4 cm.

Pancreas: Unremarkable

Spleen: Unremarkable

Adrenals/Urinary Tract: Normal adrenal glands. Kidneys enhance
symmetrically with contrast.

Stomach/Bowel: No abnormal bowel wall thickening or evidence for
bowel obstruction. Central partially calcified mesenteric mass
measures 2.6 x 2.9 cm, unchanged from prior (image 47; series 2).
Stable surrounding sub cm mesenteric lymph nodes.

Vascular/Lymphatic: Normal caliber abdominal aorta. Peripheral
calcified atherosclerotic plaque. No retroperitoneal
lymphadenopathy.

Other: Bilateral fat containing inguinal hernias. Central dystrophic
calcifications in the prostate.

Musculoskeletal: Stable sclerotic lesion involving the left seventh
rib. Lower thoracic and lumbar spine degenerative changes.
IMPRESSION: Interval increase in size of right hepatic lobe metastatic lesion.
Additional multi focal hepatic lobe metastasis are grossly stable in
size.

Unchanged partially calcified mesenteric mass.

## 2015-07-14 MED ORDER — PANCRELIPASE (LIP-PROT-AMYL) 24000-76000 UNITS PO CPEP: ORAL_CAPSULE | ORAL | Status: DC

## 2015-07-14 MED ORDER — INFLUENZA VAC SPLIT QUAD 0.5 ML IM SUSY
0.5000 mL | PREFILLED_SYRINGE | Freq: Once | INTRAMUSCULAR | Status: AC
  Administered 2015-07-14: 0.5 mL via INTRAMUSCULAR
  Filled 2015-07-14: qty 0.5

## 2015-07-14 MED ORDER — IOHEXOL 300 MG/ML  SOLN
100.0000 mL | Freq: Once | INTRAMUSCULAR | Status: AC | PRN
  Administered 2015-07-14: 100 mL via INTRAVENOUS

## 2015-07-14 MED ORDER — OCTREOTIDE ACETATE 30 MG IM KIT
PACK | INTRAMUSCULAR | Status: AC
  Filled 2015-07-14: qty 1

## 2015-07-14 NOTE — Progress Notes (Signed)
Hematology and Oncology Follow Up Visit  Brian Mays [id number] 1946/10/18 69 y.o. 07/14/2015   Principle Diagnosis:  Metastatic low grade neuroendocrine tumor-hepatic metastases   Current Therapy:    S/p second yttrium-90 intrahepatic therapy-May 2015  Somatuline 120mg  sq q month     Interim History:  Mr.  Brian Mays is back for followup.he feels pretty well. He's really not complaining of any abdominal pain.  We did go ahead and repeat his CT scan of the abdomen. This did show that he had growth of a lesion in the liver. There was not a lot a growth. A lesion in the right lower liver measured 3.5 x 2.3 cm. The other liver lesions looked fairly stable.  His last chromogranin A level in October jump from 34 of 96. It will be interesting to see what it is today.  I did speak with Dr. Kathlene Cote of interventional radiology. He is going to see if there is anything that we done with respect to this lesion.  I think that we probably need to make a change over to Somatuline. This would be reasonable as he's been on octreotide and I think that with progression, that we can make a switch and see if we can get any response.  The FDA has not yet approved the new radioisotope therapy-Lutathera.  This is approved in [place]. There is a good track record with it. Hopefully, the FDA will approve this soon.  He has family will be going to [place] in early October. They will be gone for 3 weeks. I don't see any problems with him going over to [place]  He has had a good appetite. He has had no nausea vomiting. There's been no cough or shortness of breath. He has been playing golf. He has been working.  Overall, his performance status is ECOG 1. Medications:  Current outpatient prescriptions:  .  amLODipine (NORVASC) 5 MG tablet, TAKE 1 TABLET DAILY, Disp: 30 tablet, Rfl: 11 .  aspirin EC 81 MG tablet, Take 81 mg by mouth every morning., Disp: , Rfl:  .  Cholecalciferol 1000 UNITS tablet, Take 1,000  Units by mouth daily.  , Disp: , Rfl:  .  diphenhydrAMINE (BENADRYL) 50 MG tablet, Take 1 tablet (50 mg total) by mouth once. Take one hour before scan, Disp: 10 tablet, Rfl: 2 .  doxycycline (VIBRAMYCIN) 100 MG capsule, TAKE 1 CAPSULE BY MOUTH TWICE A DAY WITH FOOD AND FULL GLASS OF WATER, Disp: , Rfl: 1 .  ergocalciferol (VITAMIN D2) 50000 UNITS capsule, Take 1 capsule (50,000 Units total) by mouth once a week., Disp: 6 capsule, Rfl: 0 .  fluocinonide cream (LIDEX) 0.05 %, APPLY TWICE DAILY TO AFFECTED AREAS AS NEEDED. NOT TO FACE, GROIN OR UNDERARMS, Disp: , Rfl: 2 .  fluticasone (FLONASE) 50 MCG/ACT nasal spray, Place 2 sprays into both nostrils daily. , Disp: , Rfl: 11 .  hydrocortisone (ANUSOL-HC) 25 MG suppository, INSERT 1 SUPPOSITORY RECTALLY TWICE A DAY AS NEEDED FOR HEMORRHOIDS, Disp: 30 suppository, Rfl: 0 .  hydrocortisone (PROCTOZONE-HC) 2.5 % rectal cream, Place 1 application rectally 2 (two) times daily. (Patient taking differently: Place 1 application rectally as needed. ), Disp: 30 g, Rfl: 4 .  lansoprazole (PREVACID) 30 MG capsule, Take 1 capsule (30 mg total) by mouth daily at 12 noon., Disp: 30 capsule, Rfl: 11 .  levothyroxine (SYNTHROID, LEVOTHROID) 50 MCG tablet, Take 1 tablet (50 mcg total) by mouth daily before breakfast., Disp: 90 tablet, Rfl: 3 .  losartan (  COZAAR) 100 MG tablet, TAKE 1 TABLET BY MOUTH EVERY MORNING, Disp: 90 tablet, Rfl: 3 .  Octreotide Acetate (SANDOSTATIN IJ), Inject 1 application as directed every 28 (twenty-eight) days. Pt is followed by Dr. Marin Olp office, Disp: , Rfl:  .  Pancrelipase, Lip-Prot-Amyl, (CREON) 24000 UNITS CPEP, 1 po tid with food, Disp: 360 capsule, Rfl: 3 .  PARoxetine (PAXIL) 10 MG tablet, Take 1 tablet (10 mg total) by mouth daily., Disp: 30 tablet, Rfl: 3 .  PARoxetine (PAXIL) 10 MG tablet, TAKE 1 TABLET BY MOUTH EVERY DAY, Disp: 30 tablet, Rfl: 3 .  predniSONE (DELTASONE) 50 MG tablet, Take 1 tablet 13h, 7h and 1h before CT scan.,  Disp: 3 tablet, Rfl: 2 .  sodium chloride (OCEAN) 0.65 % SOLN nasal spray, Place 1 spray into both nostrils as needed for congestion. , Disp: , Rfl:  .  vitamin B-12 (CYANOCOBALAMIN) 500 MCG tablet, Take 500 mcg by mouth 2 (two) times daily., Disp: , Rfl:   Allergies:  Allergies  Allergen Reactions  . Iohexol      Code: HIVES, Desc: PER [name] @ [facility], PT IS ALLERGIC TO CONTRAST DYE 10/02/08/RM  05/01/10...needs full premeds per our protocol w/ [facility]., Onset Date: [id number]     Past Medical History, Surgical history, Social history, and Family History were reviewed and updated.  Review of Systems: As above  Physical Exam:  height is 5\' 10"  (1.778 m) and weight is 206 lb (93.441 kg). His oral temperature is 98 F (36.7 C). His blood pressure is 145/91 and his pulse is 87. His respiration is 16.   Well-developed and well-nourished white gentleman. Head and exam shows no ocular or oral lesions. There are no palpable cervical or supraclavicular lymph nodes. Thyroid is not palpable. Lungs are clear. Cardiac exam is regular rate and rhythm with a normal S1 and S2. He has no murmurs, rubs or bruits.. Abdomen is soft. He has a well-healed laparotomy scar. There is no fluid wave. There is no guarding. He is no palpable liver or spleen tip. Back exam shows no tenderness over the spine, ribs or hips. Extremities shows no clubbing, cyanosis or edema. Skin exam no rashes. Neurological exam is nonfocal. Lab Results  Component Value Date   WBC 8.8 07/14/2015   HGB 13.4 07/14/2015   HCT 41.1 07/14/2015   MCV 85 07/14/2015   PLT 169 07/14/2015     Chemistry      Component Value Date/Time   NA 140 07/14/2015 1114   NA 139 06/11/2015 1417   NA 142 04/11/2015 1107   K 4.1 07/14/2015 1114   K 4.1 06/11/2015 1417   K 4.1 04/11/2015 1107   CL 100 07/14/2015 1114   CL 104 06/11/2015 1417   CO2 28 07/14/2015 1114   CO2 27 06/11/2015 1417   CO2 26 04/11/2015 1107   BUN 14 07/14/2015 1114    BUN 17 06/11/2015 1417   BUN 14.2 04/11/2015 1107   CREATININE 1.0 07/14/2015 1114   CREATININE 0.84 06/11/2015 1417   CREATININE 0.8 04/11/2015 1107      Component Value Date/Time   CALCIUM 8.7 07/14/2015 1114   CALCIUM 9.3 06/11/2015 1417   CALCIUM 9.5 04/11/2015 1107   ALKPHOS 57 07/14/2015 1114   ALKPHOS 54 06/11/2015 1417   ALKPHOS 69 04/11/2015 1107   AST 32 07/14/2015 1114   AST 24 06/11/2015 1417   AST 30 04/11/2015 1107   ALT 21 07/14/2015 1114   ALT 14 06/11/2015 1417  ALT 16 04/11/2015 1107   BILITOT 0.80 07/14/2015 1114   BILITOT 0.6 06/11/2015 1417   BILITOT 0.81 04/11/2015 1107         Impression and Plan: Mr. Huesman is 69 year old gentleman with a neuroendocrine carcinoma. He has liver metastases. He had a second intrahepatic therapy with yttrium-90 back in May 2015.   We will see what can be done from an intrahepatic point of view. Hopefully, interventional radiology might be able to help. I would like to avoid having to put him on systemic therapy with either Afinitor or Sutent. These are approved for metastatic neuroendocrine cancer.  I will plan to get him back to see [place] probably after he gets back from his vacation. I'll see any problems with delaying any treatments until after he gets back. He has a fantastic performance status and his quality of life is very good. I would not want to see him have any kind of the health issues while over in the [place].    Volanda Napoleon, MD 9/12/20166:05 PM

## 2015-07-14 NOTE — Patient Instructions (Signed)

## 2015-07-15 ENCOUNTER — Telehealth: Payer: Self-pay | Admitting: Hematology & Oncology

## 2015-07-15 ENCOUNTER — Other Ambulatory Visit (HOSPITAL_COMMUNITY): Payer: Self-pay | Admitting: Interventional Radiology

## 2015-07-15 DIAGNOSIS — C787 Secondary malignant neoplasm of liver and intrahepatic bile duct: Secondary | ICD-10-CM

## 2015-07-15 DIAGNOSIS — C7A8 Other malignant neuroendocrine tumors: Secondary | ICD-10-CM

## 2015-07-15 NOTE — Telephone Encounter (Signed)
[place]: [id number] Status: Approved  Dates: 07/22/2015 - 10/20/2015 CPT: U3735

## 2015-07-17 ENCOUNTER — Ambulatory Visit
Admission: RE | Admit: 2015-07-17 | Discharge: 2015-07-17 | Disposition: A | Discharge status: Home / Self Care | Payer: Medicare PPO | Source: Ambulatory Visit | Attending: Interventional Radiology | Admitting: Interventional Radiology

## 2015-07-17 DIAGNOSIS — C787 Secondary malignant neoplasm of liver and intrahepatic bile duct: Secondary | ICD-10-CM

## 2015-07-17 DIAGNOSIS — C7B8 Other secondary neuroendocrine tumors: Secondary | ICD-10-CM

## 2015-07-17 DIAGNOSIS — C7A8 Other malignant neuroendocrine tumors: Secondary | ICD-10-CM

## 2015-07-17 NOTE — Progress Notes (Signed)
Chief Complaint: Enlarging right lobe liver lesion and history of metastatic neuroendocrine carcinoma to the liver with previous Yttrium-90 radioembolization.  Referring Physician(s): Burney Gauze  History of Present Illness: Brian Mays is a 69 y.o. male with a history of metastatic neuroendocrine carcinoma to the liver. He is now status post yttrium-90 radioembolization procedures with treatment to the right lobe of the liver on 05/26/2010 and 12/20/2013; left lobe radioembolization on 03/26/2014.   Recently, chromogranin A level has risen up to 96 one month ago. This represents rise over the last year from a low value of 18 on 04/26/2014. Follow-up imaging has demonstrated stable lesions in the left and right lobe up to more recently on the last CT of 07/14/2015 which demonstrated enlargement of a segment 7 liver lesion over 6 months. Other disease was felt to be stable.  Past Medical History  Diagnosis Date  . GERD (gastroesophageal reflux disease)   . Pancreatitis 1998    chronic  . HTN (hypertension)   . Hemorrhoid   . Hypothyroidism   . Colon polyps   . Metastatic carcinoma 2010    Dr Jonette Eva  . Diverticulosis   . Gallstones   . Hiatal hernia     Past Surgical History  Procedure Laterality Date  . Cholecystectomy  1999  . Carcinoil resection  01/2009  . Appendectomy  1964    Allergies: Iohexol  Medications: Prior to Admission medications   Medication Sig Start Date End Date Taking? Authorizing Provider  amLODipine (NORVASC) 5 MG tablet TAKE 1 TABLET DAILY 05/30/15   Lew Dawes V, MD  aspirin EC 81 MG tablet Take 81 mg by mouth every morning.    Historical Provider, MD  Cholecalciferol 1000 UNITS tablet Take 1,000 Units by mouth daily.      Historical Provider, MD  diphenhydrAMINE (BENADRYL) 50 MG tablet Take 1 tablet (50 mg total) by mouth once. Take one hour before scan 06/11/15   Volanda Napoleon, MD  doxycycline (VIBRAMYCIN) 100 MG capsule TAKE 1  CAPSULE BY MOUTH TWICE A DAY WITH FOOD AND FULL GLASS OF WATER 05/14/15   Historical Provider, MD  ergocalciferol (VITAMIN D2) 50000 UNITS capsule Take 1 capsule (50,000 Units total) by mouth once a week. 03/10/15   Aleksei Plotnikov V, MD  fluocinonide cream (LIDEX) 0.05 % APPLY TWICE DAILY TO AFFECTED AREAS AS NEEDED. NOT TO FACE, GROIN OR UNDERARMS 05/14/15   Historical Provider, MD  fluticasone (FLONASE) 50 MCG/ACT nasal spray Place 2 sprays into both nostrils daily.  01/06/15   Historical Provider, MD  hydrocortisone (ANUSOL-HC) 25 MG suppository INSERT 1 SUPPOSITORY RECTALLY TWICE A DAY AS NEEDED FOR HEMORRHOIDS 02/07/15   Eliezer Bottom, NP  hydrocortisone (PROCTOZONE-HC) 2.5 % rectal cream Place 1 application rectally 2 (two) times daily. Patient taking differently: Place 1 application rectally as needed.  02/14/15   Volanda Napoleon, MD  lansoprazole (PREVACID) 30 MG capsule Take 1 capsule (30 mg total) by mouth daily at 12 noon. 03/06/15   Aleksei Plotnikov V, MD  levothyroxine (SYNTHROID, LEVOTHROID) 50 MCG tablet Take 1 tablet (50 mcg total) by mouth daily before breakfast. 05/26/15   Lew Dawes V, MD  losartan (COZAAR) 100 MG tablet TAKE 1 TABLET BY MOUTH EVERY MORNING 03/14/15   Volanda Napoleon, MD  Octreotide Acetate (SANDOSTATIN IJ) Inject 1 application as directed every 28 (twenty-eight) days. Pt is followed by Dr. Marin Olp office    Historical Provider, MD  Pancrelipase, Lip-Prot-Amyl, (CREON) 24000 UNITS CPEP 1  po tid with food 07/14/15   Volanda Napoleon, MD  PARoxetine (PAXIL) 10 MG tablet Take 1 tablet (10 mg total) by mouth daily. 11/13/14   Volanda Napoleon, MD  PARoxetine (PAXIL) 10 MG tablet TAKE 1 TABLET BY MOUTH EVERY DAY 07/09/15   Volanda Napoleon, MD  predniSONE (DELTASONE) 50 MG tablet Take 1 tablet 13h, 7h and 1h before CT scan. 06/11/15   Volanda Napoleon, MD  sodium chloride (OCEAN) 0.65 % SOLN nasal spray Place 1 spray into both nostrils as needed for congestion.     Historical  Provider, MD  vitamin B-12 (CYANOCOBALAMIN) 500 MCG tablet Take 500 mcg by mouth 2 (two) times daily.    Historical Provider, MD     Family History  Problem Relation Age of Onset  . Kidney disease Mother   . Hyperlipidemia Mother   . Hypertension Mother   . COPD Father   . Ulcerative colitis Daughter     Social History   Social History  . Marital Status: Married    Spouse Name: N/A  . Number of Children: 1  . Years of Education: N/A   Occupational History  . Sales    Social History Main Topics  . Smoking status: Former Smoker -- 1.50 packs/day for 39 years    Types: Cigarettes    Start date: 09/25/1959    Quit date: 11/01/1997  . Smokeless tobacco: Never Used     Comment: quit smoking 18 years ago  . Alcohol Use: 0.0 oz/week    0 Standard drinks or equivalent per week     Comment: 1/2 per day  . Drug Use: No  . Sexual Activity: Yes   Other Topics Concern  . Not on file   Social History Narrative   Regular Exercise -  NO    ECOG Status: 0 - Asymptomatic  Review of Systems: A 12 point ROS discussed and pertinent positives are indicated in the HPI above.  All other systems are negative.  Review of Systems  Constitutional: Negative.   Respiratory: Negative.   Cardiovascular: Negative.   Gastrointestinal: Negative.   Endocrine: Negative.   Genitourinary: Negative.   Musculoskeletal: Negative.   Skin: Negative.   Neurological: Negative.   Hematological: Negative.     Vital Signs: BP 171/93 mmHg  Pulse 76  Temp(Src) 98.1 F (36.7 C)  Resp 14  SpO2 98%  Physical Exam  Constitutional: He is oriented to person, place, and time. He appears well-developed and well-nourished. No distress.  Abdominal: Soft. He exhibits no distension and no mass. There is no tenderness. There is no rebound and no guarding.  Neurological: He is alert and oriented to person, place, and time.  Skin: He is not diaphoretic.  Nursing note and vitals reviewed.   Imaging: Ct  Abdomen Pelvis W Contrast  07/14/2015   CLINICAL DATA:  Patient with history of neuroendocrine carcinoma with hepatic metastasis.  EXAM: CT ABDOMEN AND PELVIS WITH CONTRAST  TECHNIQUE: Multidetector CT imaging of the abdomen and pelvis was performed using the standard protocol following bolus administration of intravenous contrast.  CONTRAST:  17m OMNIPAQUE IOHEXOL 300 MG/ML  SOLN  COMPARISON:  CT chest abdomen pelvis 01/13/2015  FINDINGS: Lower chest: Dependent atelectasis within the bilateral lower lobes. No pleural effusion. Heart size.  Hepatobiliary: Left hepatic lobe mass is unchanged measuring 4.4 x 3.2 cm, previously 4.3 x 3.3 cm (image 13; series 2). Inferior right hepatic lobe cystic and solid mass is unchanged measuring 8.1 x 9.1  cm (image 32; series 2), previously 8.0 x 9.2 cm. Slight interval increase in size of right hepatic lobe mass measuring 3.5 x 2.3 cm (image 14; series 2), previously 2.3 x 1.4 cm.  Pancreas: Unremarkable  Spleen: Unremarkable  Adrenals/Urinary Tract: Normal adrenal glands. Kidneys enhance symmetrically with contrast.  Stomach/Bowel: No abnormal bowel wall thickening or evidence for bowel obstruction. Central partially calcified mesenteric mass measures 2.6 x 2.9 cm, unchanged from prior (image 47; series 2). Stable surrounding sub cm mesenteric lymph nodes.  Vascular/Lymphatic: Normal caliber abdominal aorta. Peripheral calcified atherosclerotic plaque. No retroperitoneal lymphadenopathy.  Other: Bilateral fat containing inguinal hernias. Central dystrophic calcifications in the prostate.  Musculoskeletal: Stable sclerotic lesion involving the left seventh rib. Lower thoracic and lumbar spine degenerative changes.  IMPRESSION: Interval increase in size of right hepatic lobe metastatic lesion. Additional multi focal hepatic lobe metastasis are grossly stable in size.  Unchanged partially calcified mesenteric mass.   Electronically Signed   By: Lovey Newcomer M.D.   On: 07/14/2015  12:06    Labs:  CBC:  Recent Labs  03/07/15 0824 04/11/15 0956 06/11/15 1417 07/14/15 1114  WBC 6.0 5.6 6.6 8.8  HGB 13.7 13.6 13.4 13.4  HCT 40.8 40.7 40.3 41.1  PLT 171.0 157 186 169    COAGS: No results for input(s): INR, APTT in the last 8760 hours.  BMP:  Recent Labs  02/14/15 0943 03/07/15 0824 04/11/15 1107 06/11/15 1417 07/14/15 1114  NA 144 141 142 139 140  K 4.1 4.3 4.1 4.1 4.1  CL 103 103  --  104 100  CO2 '31 31 26 27 28  ' GLUCOSE 116 98 73 117* 131*  BUN 11 13 14.'2 17 14  ' CALCIUM 9.5 9.3 9.5 9.3 8.7  CREATININE 0.8 0.86 0.8 0.84 1.0    LIVER FUNCTION TESTS:  Recent Labs  09/19/14 1317  03/07/15 0824 04/11/15 1107 06/11/15 1417 07/14/15 1114  BILITOT 0.5  < > 0.5 0.81 0.6 0.80  AST 26  < > '22 30 24 ' 32  ALT 20  < > '16 16 14 21  ' ALKPHOS 70  < > 65 69 54 57  PROT 6.7  < > 7.3 7.7 7.2 8.0  ALBUMIN 3.6  --  3.6 3.4* 3.6  --   < > = values in this interval not displayed.  TUMOR MARKERS:  Recent Labs  01/13/15 0858 02/14/15 0944 04/11/15 0956 06/11/15 1417  CHROMGRNA 12 42* 34* 96*    Assessment and Plan:  I met with Mr. Kerner and reviewed his most recent CT on 07/14/2015 compared to the prior study of 01/13/2015. Based on measurements I made today, a lesion in segment 7 of the right lobe has increased from approximately 1.7 x 2.6 cm to 2.0 x 3.4 cm. Other lesions in the left and right lobes appear stable. There may be a subtle new 8-9 mm lesion in segment 8.  Although additional radioembolization could be considered for progressive disease, it appears that only one definite lesion has enlarged. I told Mr. Jaquith that I would consult with Dr. Suzy Bouchard regarding how much more radiation exposure his liver could safely tolerate. I would like to reserve potential additional Y-90 for significant progression of disease to treat multiple lesions in the future. Additional options to treat current progression of disease are bland particle  embolization, chemoembolization with drug eluting beads and percutaneous thermal ablation.  I told Mr. Dupree that the best option currently to treat this definite enlarging segment 7 liver  lesion is thermal ablation under CT guidance. This would be performed under general anesthesia. Mr. Gabriel would like to proceed with scheduling ablation. He is leaving for an extended trip to [place] later this month and will be back home on October 25. We will try to schedule an ablation shortly after his return, likely in early November.   SignedAletta Edouard T 07/17/2015, 9:11 AM   I spent a total of 15 Minutes in face to face in clinical consultation, greater than 50% of which was counseling/coordinating care for metastatic neuroendocrine carcinoma to the liver.

## 2015-07-18 ENCOUNTER — Other Ambulatory Visit: Payer: Self-pay | Admitting: Interventional Radiology

## 2015-07-18 DIAGNOSIS — C7A8 Other malignant neuroendocrine tumors: Secondary | ICD-10-CM

## 2015-07-18 DIAGNOSIS — C7B8 Other secondary neuroendocrine tumors: Principal | ICD-10-CM

## 2015-07-18 LAB — CHROMOGRANIN A: Chromogranin A: 17 ng/mL — ABNORMAL HIGH (ref <=15)

## 2015-07-22 ENCOUNTER — Ambulatory Visit (HOSPITAL_BASED_OUTPATIENT_CLINIC_OR_DEPARTMENT_OTHER): Payer: Medicare PPO

## 2015-07-22 VITALS — BP 137/83 | HR 62 | Temp 98.3°F | Resp 18

## 2015-07-22 DIAGNOSIS — C7A8 Other malignant neuroendocrine tumors: Secondary | ICD-10-CM

## 2015-07-22 DIAGNOSIS — C7B8 Other secondary neuroendocrine tumors: Secondary | ICD-10-CM | POA: Diagnosis not present

## 2015-07-22 DIAGNOSIS — D499 Neoplasm of unspecified behavior of unspecified site: Secondary | ICD-10-CM

## 2015-07-22 MED ORDER — LANREOTIDE ACETATE 120 MG/0.5ML ~~LOC~~ SOLN
120.0000 mg | Freq: Once | SUBCUTANEOUS | Status: AC
  Administered 2015-07-22: 120 mg via SUBCUTANEOUS
  Filled 2015-07-22: qty 120

## 2015-07-22 NOTE — Patient Instructions (Addendum)
Lanreotide injection What is this medicine? LANREOTIDE (lan REE oh tide) is used to reduce blood levels of growth hormone in patients with a condition called acromegaly. It also works to slow or stop tumor growth in patients with gastroenteropancreatic neuroendocrine tumor (GEP-NET). This medicine may be used for other purposes; ask your health care provider or pharmacist if you have questions. COMMON BRAND NAME(S): Somatuline [place] What should I tell my health care provider before I take this medicine? They need to know if you have any of these conditions: -diabetes -gallbladder disease -heart disease -kidney disease -liver disease -an unusual or allergic reaction to lanreotide, other medicines, latex, foods, dyes, or preservatives -pregnant or trying to get pregnant -breast-feeding How should I use this medicine? This medicine is for injection under the skin. It is given by a health care professional in a hospital or clinic setting. Contact your pediatrician or health care professional regarding the use of this medicine in children. Special care may be needed. Overdosage: If you think you have taken too much of this medicine contact a poison control center or emergency room at once. NOTE: This medicine is only for you. Do not share this medicine with others. What if I miss a dose? It is important not to miss your dose. Call your doctor or health care professional if you are unable to keep an appointment. What may interact with this medicine? -bromocriptine -cyclosporine -medicines for diabetes, including insulin -medicines for heart disease or hypertension -quinidine This list may not describe all possible interactions. Give your health care provider a list of all the medicines, herbs, non-prescription drugs, or dietary supplements you use. Also tell them if you smoke, drink alcohol, or use illegal drugs. Some items may interact with your medicine. What should I watch for while using  this medicine? Visit your doctor or health care professional for regular checks on your progress. Your condition will be monitored carefully while you are receiving this medicine. This medicine may cause increases or decreases in blood sugar. Signs of high blood sugar include frequent urination, unusual thirst, flushed or dry skin, difficulty breathing, drowsiness, stomach ache, nausea, vomiting or dry mouth. Signs of low blood sugar include chills, cool, pale skin or cold sweats, drowsiness, extreme hunger, fast heartbeat, headache, nausea, nervousness or anxiety, shakiness, trembling, unsteadiness, tiredness, or weakness. Contact your doctor or health care professional right away if you experience any of these symptoms. What side effects may I notice from receiving this medicine? Side effects that you should report to your doctor or health care professional as soon as possible: -allergic reactions like skin rash, itching or hives, swelling of the face, lips, or tongue -changes in blood sugar -changes in heart rate -severe stomach pain Side effects that usually do not require medical attention (report to your doctor or health care professional if they continue or are bothersome): -diarrhea or constipation -gas or stomach pain -nausea, vomiting -pain, redness, swelling and irritation at site where injected This list may not describe all possible side effects. Call your doctor for medical advice about side effects. You may report side effects to FDA at 1-800-FDA-1088. Where should I keep my medicine? This drug is given in a hospital or clinic and will not be stored at home. NOTE: This sheet is a summary. It may not cover all possible information. If you have questions about this medicine, talk to your doctor, pharmacist, or health care provider.  2015, [employer]vier/[employer] Standard. (2013-10-17 17:43:04)  

## 2015-07-29 ENCOUNTER — Other Ambulatory Visit: Payer: Self-pay | Admitting: Internal Medicine

## 2015-07-29 ENCOUNTER — Telehealth: Payer: Self-pay | Admitting: Internal Medicine

## 2015-07-29 DIAGNOSIS — Z0000 Encounter for general adult medical examination without abnormal findings: Secondary | ICD-10-CM

## 2015-07-29 DIAGNOSIS — C7B8 Other secondary neuroendocrine tumors: Secondary | ICD-10-CM

## 2015-07-29 MED ORDER — PANCRELIPASE (LIP-PROT-AMYL) 24000-76000 UNITS PO CPEP: ORAL_CAPSULE | ORAL | Status: DC

## 2015-07-29 NOTE — Telephone Encounter (Signed)
Is requesting script for creon to be sent to [facility] on [address].  Patient is going out of country for a month.  States patient takes this 3 times a day and needs enough to get him through until he gets back.  Is requesting this medication to be at pharmacy by Friday.

## 2015-07-29 NOTE — Telephone Encounter (Signed)
Done. See meds. Pt informed  

## 2015-07-30 ENCOUNTER — Other Ambulatory Visit: Payer: Self-pay | Admitting: Internal Medicine

## 2015-07-30 MED ORDER — PANCRELIPASE (LIP-PROT-AMYL) 24000-76000 UNITS PO CPEP: ORAL_CAPSULE | ORAL | Status: DC

## 2015-07-30 NOTE — Addendum Note (Signed)
Addended by: Cassandria Anger on: 07/30/2015 07:35 AM   Modules accepted: Orders

## 2015-09-01 ENCOUNTER — Other Ambulatory Visit: Payer: Self-pay | Admitting: Radiology

## 2015-09-03 ENCOUNTER — Ambulatory Visit (HOSPITAL_BASED_OUTPATIENT_CLINIC_OR_DEPARTMENT_OTHER): Payer: Medicare PPO

## 2015-09-03 ENCOUNTER — Encounter: Payer: Self-pay | Admitting: Hematology & Oncology

## 2015-09-03 ENCOUNTER — Other Ambulatory Visit (HOSPITAL_BASED_OUTPATIENT_CLINIC_OR_DEPARTMENT_OTHER): Payer: Medicare PPO

## 2015-09-03 ENCOUNTER — Ambulatory Visit (HOSPITAL_BASED_OUTPATIENT_CLINIC_OR_DEPARTMENT_OTHER): Payer: Medicare PPO | Admitting: Hematology & Oncology

## 2015-09-03 VITALS — BP 150/80 | HR 69 | Temp 97.6°F | Wt 205.5 lb

## 2015-09-03 DIAGNOSIS — C7B8 Other secondary neuroendocrine tumors: Secondary | ICD-10-CM | POA: Diagnosis not present

## 2015-09-03 DIAGNOSIS — C7A8 Other malignant neuroendocrine tumors: Secondary | ICD-10-CM | POA: Diagnosis not present

## 2015-09-03 DIAGNOSIS — D499 Neoplasm of unspecified behavior of unspecified site: Secondary | ICD-10-CM

## 2015-09-03 DIAGNOSIS — Z0000 Encounter for general adult medical examination without abnormal findings: Secondary | ICD-10-CM

## 2015-09-03 LAB — CBC WITH DIFFERENTIAL (CANCER CENTER ONLY)
BASO#: 0.1 10*3/uL (ref 0.0–0.2)
BASO%: 0.7 % (ref 0.0–2.0)
EOS ABS: 0.5 10*3/uL (ref 0.0–0.5)
EOS%: 6.7 % (ref 0.0–7.0)
HEMATOCRIT: 41.1 % (ref 38.7–49.9)
HGB: 13.4 g/dL (ref 13.0–17.1)
LYMPH#: 1.3 10*3/uL (ref 0.9–3.3)
LYMPH%: 18.3 % (ref 14.0–48.0)
MCH: 27.7 pg — AB (ref 28.0–33.4)
MCHC: 32.6 g/dL (ref 32.0–35.9)
MCV: 85 fL (ref 82–98)
MONO#: 0.5 10*3/uL (ref 0.1–0.9)
MONO%: 7.4 % (ref 0.0–13.0)
NEUT#: 4.7 10*3/uL (ref 1.5–6.5)
NEUT%: 66.9 % (ref 40.0–80.0)
PLATELETS: 175 10*3/uL (ref 145–400)
RBC: 4.84 10*6/uL (ref 4.20–5.70)
RDW: 15.1 % (ref 11.1–15.7)
WBC: 7 10*3/uL (ref 4.0–10.0)

## 2015-09-03 LAB — COMPREHENSIVE METABOLIC PANEL (CC13)
ALT: 15 U/L (ref 0–55)
ANION GAP: 7 meq/L (ref 3–11)
AST: 24 U/L (ref 5–34)
Albumin: 3.5 g/dL (ref 3.5–5.0)
Alkaline Phosphatase: 66 U/L (ref 40–150)
BILIRUBIN TOTAL: 0.63 mg/dL (ref 0.20–1.20)
BUN: 13.2 mg/dL (ref 7.0–26.0)
CALCIUM: 9.7 mg/dL (ref 8.4–10.4)
CO2: 30 meq/L — AB (ref 22–29)
CREATININE: 0.9 mg/dL (ref 0.7–1.3)
Chloride: 104 mEq/L (ref 98–109)
EGFR: 88 mL/min/{1.73_m2} — AB (ref >90)
Glucose: 104 mg/dl (ref 70–140)
Potassium: 3.8 mEq/L (ref 3.5–5.1)
Sodium: 142 mEq/L (ref 136–145)
TOTAL PROTEIN: 8 g/dL (ref 6.4–8.3)

## 2015-09-03 LAB — LACTATE DEHYDROGENASE (CC13): LDH: 190 U/L (ref 125–245)

## 2015-09-03 MED ORDER — LANREOTIDE ACETATE 120 MG/0.5ML ~~LOC~~ SOLN
120.0000 mg | Freq: Once | SUBCUTANEOUS | Status: AC
  Administered 2015-09-03: 120 mg via SUBCUTANEOUS
  Filled 2015-09-03: qty 120

## 2015-09-03 NOTE — Patient Instructions (Signed)
Lanreotide injection What is this medicine? LANREOTIDE (lan REE oh tide) is used to reduce blood levels of growth hormone in patients with a condition called acromegaly. It also works to slow or stop tumor growth in patients with gastroenteropancreatic neuroendocrine tumor (GEP-NET). This medicine may be used for other purposes; ask your health care provider or pharmacist if you have questions. What should I tell my health care provider before I take this medicine? They need to know if you have any of these conditions: -diabetes -gallbladder disease -heart disease -kidney disease -liver disease -an unusual or allergic reaction to lanreotide, other medicines, latex, foods, dyes, or preservatives -pregnant or trying to get pregnant -breast-feeding How should I use this medicine? This medicine is for injection under the skin. It is given by a health care professional in a hospital or clinic setting. Contact your pediatrician or health care professional regarding the use of this medicine in children. Special care may be needed. Overdosage: If you think you have taken too much of this medicine contact a poison control center or emergency room at once. NOTE: This medicine is only for you. Do not share this medicine with others. What if I miss a dose? It is important not to miss your dose. Call your doctor or health care professional if you are unable to keep an appointment. What may interact with this medicine? -bromocriptine -cyclosporine -medicines for diabetes, including insulin -medicines for heart disease or hypertension -quinidine This list may not describe all possible interactions. Give your health care provider a list of all the medicines, herbs, non-prescription drugs, or dietary supplements you use. Also tell them if you smoke, drink alcohol, or use illegal drugs. Some items may interact with your medicine. What should I watch for while using this medicine? Visit your doctor or  health care professional for regular checks on your progress. Your condition will be monitored carefully while you are receiving this medicine. This medicine may cause increases or decreases in blood sugar. Signs of high blood sugar include frequent urination, unusual thirst, flushed or dry skin, difficulty breathing, drowsiness, stomach ache, nausea, vomiting or dry mouth. Signs of low blood sugar include chills, cool, pale skin or cold sweats, drowsiness, extreme hunger, fast heartbeat, headache, nausea, nervousness or anxiety, shakiness, trembling, unsteadiness, tiredness, or weakness. Contact your doctor or health care professional right away if you experience any of these symptoms. What side effects may I notice from receiving this medicine? Side effects that you should report to your doctor or health care professional as soon as possible: -allergic reactions like skin rash, itching or hives, swelling of the face, lips, or tongue -changes in blood sugar -changes in heart rate -severe stomach pain Side effects that usually do not require medical attention (report to your doctor or health care professional if they continue or are bothersome): -diarrhea or constipation -gas or stomach pain -nausea, vomiting -pain, redness, swelling and irritation at site where injected This list may not describe all possible side effects. Call your doctor for medical advice about side effects. You may report side effects to FDA at 1-800-FDA-1088. Where should I keep my medicine? This drug is given in a hospital or clinic and will not be stored at home. NOTE: This sheet is a summary. It may not cover all possible information. If you have questions about this medicine, talk to your doctor, pharmacist, or health care provider.    2016, Elsevier/Gold Standard. (2013-10-17 17:43:04)

## 2015-09-03 NOTE — Progress Notes (Signed)
Hematology and Oncology Follow Up Visit  VONTRELL PULLMAN [id number] 03-07-1946 69 y.o. 09/03/2015   Principle Diagnosis:  Metastatic low grade neuroendocrine tumor-hepatic metastases   Current Therapy:    S/p second yttrium-90 intrahepatic therapy-May 2015  Somatuline 120mg  sq q month     Interim History:  Mr.  Specht is back for followup.he had a great time over in [place]. He went over to [place] for a couple weeks about a month or so ago. He really enjoyed himself. He is wife were over at her mom's house. He played golf. He was very active.  He actually is going to have a ablative procedure for a liver lesion next Friday. This will be done by radiology.  He's done well with the Somatuline. His last chromogranin A level was 17.  He's had no problems with diarrhea. He's had no nausea vomiting. He's had no cough or shortness of breath.  He's had no leg swelling.  Overall, his performance status is ECOG 1.   Medications:  Current outpatient prescriptions:  .  amLODipine (NORVASC) 5 MG tablet, TAKE 1 TABLET DAILY, Disp: 30 tablet, Rfl: 11 .  aspirin EC 81 MG tablet, Take 81 mg by mouth every morning., Disp: , Rfl:  .  Cholecalciferol 1000 UNITS tablet, Take 1,000 Units by mouth daily.  , Disp: , Rfl:  .  fluticasone (FLONASE) 50 MCG/ACT nasal spray, Place 2 sprays into both nostrils daily as needed (sinuses). , Disp: , Rfl: 11 .  hydrocortisone (ANUSOL-HC) 25 MG suppository, INSERT 1 SUPPOSITORY RECTALLY TWICE A DAY AS NEEDED FOR HEMORRHOIDS, Disp: 30 suppository, Rfl: 0 .  Lanreotide Acetate (SOMATULINE DEPOT [place]), Inject 120 mcg into the skin every 28 (twenty-eight) days. Receives at Dr Antonieta Pert office, Disp: , Rfl:  .  lansoprazole (PREVACID) 30 MG capsule, Take 1 capsule (30 mg total) by mouth daily at 12 noon. (Patient taking differently: Take 30 mg by mouth daily. ), Disp: 30 capsule, Rfl: 11 .  levothyroxine (SYNTHROID, LEVOTHROID) 50 MCG tablet, TAKE 1 TABLET DAILY., Disp:  90 tablet, Rfl: 2 .  losartan (COZAAR) 100 MG tablet, TAKE 1 TABLET BY MOUTH EVERY MORNING, Disp: 90 tablet, Rfl: 3 .  metoCLOPramide (REGLAN) 10 MG tablet, Take 10 mg by mouth daily., Disp: , Rfl:  .  Omega-3 Fatty Acids (FISH OIL) 1000 MG CAPS, Take 1 capsule by mouth daily., Disp: , Rfl:  .  Pancrelipase, Lip-Prot-Amyl, (CREON) 24000 UNITS CPEP, 1 po tid with food (Patient taking differently: Take 1 capsule by mouth 3 (three) times daily with meals. ), Disp: 270 capsule, Rfl: 3 .  PARoxetine (PAXIL) 10 MG tablet, Take 1 tablet (10 mg total) by mouth daily., Disp: 30 tablet, Rfl: 3 .  sodium chloride (OCEAN) 0.65 % SOLN nasal spray, Place 1 spray into both nostrils as needed for congestion. , Disp: , Rfl:  .  vitamin B-12 (CYANOCOBALAMIN) 1000 MCG tablet, Take 1,000 mcg by mouth daily., Disp: , Rfl:   Allergies:  Allergies  Allergen Reactions  . Iohexol      Code: HIVES, Desc: PER [name] @ [facility]IMARY [facility]ARE, PT IS ALLERGIC TO CONTRAST DYE 10/02/08/RM  05/01/10...needs full premeds per our protocol w/ [facility] imaging., Onset Date: [id number]     Past Medical History, Surgical history, Social history, and Family History were reviewed and updated.  Review of Systems: As above  Physical Exam:  weight is 205 lb 8 oz (93.214 kg). His oral temperature is 97.6 F (36.4 C). His blood pressure is 150/80  and his pulse is 69.   Well-developed and well-nourished white gentleman. Head and exam shows no ocular or oral lesions. There are no palpable cervical or supraclavicular lymph nodes. Thyroid is not palpable. Lungs are clear. Cardiac exam is regular rate and rhythm with a normal S1 and S2. He has no murmurs, rubs or bruits.. Abdomen is soft. He has a well-healed laparotomy scar. There is no fluid wave. There is no guarding. He is no palpable liver or spleen tip. Back exam shows no tenderness over the spine, ribs or hips. Extremities shows no clubbing, cyanosis or edema. Skin exam no rashes. Neurological exam is  nonfocal. Lab Results  Component Value Date   WBC 7.0 09/03/2015   HGB 13.4 09/03/2015   HCT 41.1 09/03/2015   MCV 85 09/03/2015   PLT 175 09/03/2015     Chemistry      Component Value Date/Time   NA 140 07/14/2015 1114   NA 139 06/11/2015 1417   NA 142 04/11/2015 1107   K 4.1 07/14/2015 1114   K 4.1 06/11/2015 1417   K 4.1 04/11/2015 1107   CL 100 07/14/2015 1114   CL 104 06/11/2015 1417   CO2 28 07/14/2015 1114   CO2 27 06/11/2015 1417   CO2 26 04/11/2015 1107   BUN 14 07/14/2015 1114   BUN 17 06/11/2015 1417   BUN 14.2 04/11/2015 1107   CREATININE 1.0 07/14/2015 1114   CREATININE 0.84 06/11/2015 1417   CREATININE 0.8 04/11/2015 1107      Component Value Date/Time   CALCIUM 8.7 07/14/2015 1114   CALCIUM 9.3 06/11/2015 1417   CALCIUM 9.5 04/11/2015 1107   ALKPHOS 57 07/14/2015 1114   ALKPHOS 54 06/11/2015 1417   ALKPHOS 69 04/11/2015 1107   AST 32 07/14/2015 1114   AST 24 06/11/2015 1417   AST 30 04/11/2015 1107   ALT 21 07/14/2015 1114   ALT 14 06/11/2015 1417   ALT 16 04/11/2015 1107   BILITOT 0.80 07/14/2015 1114   BILITOT 0.6 06/11/2015 1417   BILITOT 0.81 04/11/2015 1107         Impression and Plan: Mr. Graybeal is 69 year old gentleman with a neuroendocrine carcinoma. He has liver metastases. He had a second intrahepatic therapy with yttrium-90 back in May 2015.   We will see what happens with this ablative procedure. Hopefully, it will help. I think that the chromogranin A we'll give [place] a good idea.  I will plan to see him back in another month.  IPod would wait a good 2 months after his procedure before another scan is done. Volanda Napoleon, MD 11/2/20162:37 PM

## 2015-09-04 ENCOUNTER — Telehealth: Payer: Self-pay | Admitting: Hematology & Oncology

## 2015-09-04 NOTE — Telephone Encounter (Signed)
octreotide (SANDOSTATIN LAR) IM injection 30 mg - I0165   lanreotide acetate (SOMATULINE DEPOT) injection 120 mg - [id number]

## 2015-09-05 ENCOUNTER — Encounter (HOSPITAL_COMMUNITY)
Admission: RE | Admit: 2015-09-05 | Discharge: 2015-09-05 | Disposition: A | Discharge status: Home / Self Care | Payer: Medicare PPO | Source: Ambulatory Visit | Attending: Interventional Radiology | Admitting: Interventional Radiology

## 2015-09-05 ENCOUNTER — Encounter (HOSPITAL_COMMUNITY): Payer: Self-pay

## 2015-09-05 ENCOUNTER — Other Ambulatory Visit: Payer: Self-pay

## 2015-09-05 ENCOUNTER — Other Ambulatory Visit: Payer: Self-pay | Admitting: Radiology

## 2015-09-05 DIAGNOSIS — C7B8 Other secondary neuroendocrine tumors: Secondary | ICD-10-CM | POA: Insufficient documentation

## 2015-09-05 DIAGNOSIS — C787 Secondary malignant neoplasm of liver and intrahepatic bile duct: Secondary | ICD-10-CM | POA: Diagnosis not present

## 2015-09-05 DIAGNOSIS — C7A8 Other malignant neuroendocrine tumors: Secondary | ICD-10-CM | POA: Insufficient documentation

## 2015-09-05 DIAGNOSIS — Z01818 Encounter for other preprocedural examination: Secondary | ICD-10-CM | POA: Insufficient documentation

## 2015-09-05 HISTORY — DX: Anemia, unspecified: D64.9

## 2015-09-05 HISTORY — DX: Cardiac murmur, unspecified: R01.1

## 2015-09-05 LAB — COMPREHENSIVE METABOLIC PANEL
ALT: 14 U/L — AB (ref 17–63)
AST: 28 U/L (ref 15–41)
Albumin: 3.5 g/dL (ref 3.5–5.0)
Alkaline Phosphatase: 56 U/L (ref 38–126)
Anion gap: 8 (ref 5–15)
BILIRUBIN TOTAL: 0.8 mg/dL (ref 0.3–1.2)
BUN: 14 mg/dL (ref 6–20)
CALCIUM: 9 mg/dL (ref 8.9–10.3)
CHLORIDE: 105 mmol/L (ref 101–111)
CO2: 26 mmol/L (ref 22–32)
CREATININE: 0.74 mg/dL (ref 0.61–1.24)
Glucose, Bld: 100 mg/dL — ABNORMAL HIGH (ref 65–99)
Potassium: 4.4 mmol/L (ref 3.5–5.1)
SODIUM: 139 mmol/L (ref 135–145)
Total Protein: 7.8 g/dL (ref 6.5–8.1)

## 2015-09-05 LAB — CBC WITH DIFFERENTIAL/PLATELET
BASOS ABS: 0.1 10*3/uL (ref 0.0–0.1)
Basophils Relative: 1 %
EOS ABS: 0.5 10*3/uL (ref 0.0–0.7)
EOS PCT: 7 %
HCT: 40.3 % (ref 39.0–52.0)
Hemoglobin: 13 g/dL (ref 13.0–17.0)
LYMPHS PCT: 19 %
Lymphs Abs: 1.4 10*3/uL (ref 0.7–4.0)
MCH: 27.5 pg (ref 26.0–34.0)
MCHC: 32.3 g/dL (ref 30.0–36.0)
MCV: 85.4 fL (ref 78.0–100.0)
Monocytes Absolute: 0.7 10*3/uL (ref 0.1–1.0)
Monocytes Relative: 9 %
NEUTROS PCT: 64 %
Neutro Abs: 4.5 10*3/uL (ref 1.7–7.7)
PLATELETS: 171 10*3/uL (ref 150–400)
RBC: 4.72 MIL/uL (ref 4.22–5.81)
RDW: 15 % (ref 11.5–15.5)
WBC: 7 10*3/uL (ref 4.0–10.5)

## 2015-09-05 LAB — ABO/RH: ABO/RH(D): A NEG

## 2015-09-05 LAB — PROTIME-INR
INR: 1 (ref 0.00–1.49)
PROTHROMBIN TIME: 13.4 s (ref 11.6–15.2)

## 2015-09-05 LAB — APTT: APTT: 28 s (ref 24–37)

## 2015-09-05 NOTE — Patient Instructions (Addendum)
Brian Mays  09/05/2015   Your procedure is scheduled on: Friday September 12, 2015  Report to [facility] Main  Entrance and go to Radiology Dept arriving at 6:30 AM.  Call this number if you have problems the morning of surgery [phone]   Remember: ONLY 1 PERSON MAY GO WITH YOU TO SHORT STAY TO GET  [place].  Do not eat food or drink liquids :After Midnight.     Take these medicines the morning of surgery with A SIP OF WATER: AMLODIPINE (Norvasc); FLONASE (bring with you day of surgery); LANSOPRAZOLE (Prevacid); LEVOTHYROXINE; METOCLOPRAMIDE (Reglan); PAROXITINE (Paxil)                               You may not have any metal on your body including hair pins and              piercings  Do not wear jewelry,  lotions, powders or colognes, deodorant                           Men may shave face and neck.   Do not bring valuables to the hospital. [place].  Contacts, dentures or bridgework may not be worn into surgery.  Leave suitcase in the car. After surgery it may be brought to your room.   _____________________________________________________________________             [facility] - Preparing for Surgery Before surgery, you can play an important role.  Because skin is not sterile, your skin needs to be as free of germs as possible.  You can reduce the number of germs on your skin by washing with CHG (chlorahexidine gluconate) soap before surgery.  CHG is an antiseptic cleaner which kills germs and bonds with the skin to continue killing germs even after washing. Please DO NOT use if you have an allergy to CHG or antibacterial soaps.  If your skin becomes reddened/irritated stop using the CHG and inform your nurse when you arrive at Short Stay. Do not shave (including legs and underarms) for at least 48 hours prior to the first CHG shower.  You may shave your face/neck. Please  follow these instructions carefully:  1.  Shower with CHG Soap the night before surgery and the  morning of Surgery.  2.  If you choose to wash your hair, wash your hair first as usual with your  normal  shampoo.  3.  After you shampoo, rinse your hair and body thoroughly to remove the  shampoo.                           4.  Use CHG as you would any other liquid soap.  You can apply chg directly  to the skin and wash                       Gently with a scrungie or clean washcloth.  5.  Apply the CHG Soap to your body ONLY FROM THE NECK DOWN.   Do not use on face/ open  Wound or open sores. Avoid contact with eyes, ears mouth and genitals (private parts).                       Wash face,  Genitals (private parts) with your normal soap.             6.  Wash thoroughly, paying special attention to the area where your surgery  will be performed.  7.  Thoroughly rinse your body with warm water from the neck down.  8.  DO NOT shower/wash with your normal soap after using and rinsing off  the CHG Soap.                9.  Pat yourself dry with a clean towel.            10.  Wear clean pajamas.            11.  Place clean sheets on your bed the night of your first shower and do not  sleep with pets. Day of Surgery : Do not apply any lotions/deodorants the morning of surgery.  Please wear clean clothes to the hospital/surgery center.  FAILURE TO FOLLOW THESE INSTRUCTIONS MAY RESULT IN THE CANCELLATION OF YOUR SURGERY PATIENT SIGNATURE_________________________________  NURSE SIGNATURE__________________________________  __

## 2015-09-05 NOTE — Progress Notes (Addendum)
LOV note per Dr Einar Gip on chart 11/07/2013  Stress test report per chart 09/24/2013 ECHO per chart 10/08/2013  CT Chest per epic 01/13/2015

## 2015-09-08 ENCOUNTER — Encounter: Payer: Self-pay | Admitting: Internal Medicine

## 2015-09-08 ENCOUNTER — Ambulatory Visit (INDEPENDENT_AMBULATORY_CARE_PROVIDER_SITE_OTHER): Payer: Medicare PPO | Admitting: Internal Medicine

## 2015-09-08 VITALS — BP 128/80 | HR 73 | Wt 206.0 lb

## 2015-09-08 DIAGNOSIS — E538 Deficiency of other specified B group vitamins: Secondary | ICD-10-CM

## 2015-09-08 DIAGNOSIS — E559 Vitamin D deficiency, unspecified: Secondary | ICD-10-CM | POA: Diagnosis not present

## 2015-09-08 DIAGNOSIS — R739 Hyperglycemia, unspecified: Secondary | ICD-10-CM

## 2015-09-08 DIAGNOSIS — B079 Viral wart, unspecified: Secondary | ICD-10-CM | POA: Diagnosis not present

## 2015-09-08 DIAGNOSIS — K861 Other chronic pancreatitis: Secondary | ICD-10-CM | POA: Diagnosis not present

## 2015-09-08 DIAGNOSIS — C7B8 Other secondary neuroendocrine tumors: Secondary | ICD-10-CM

## 2015-09-08 NOTE — Assessment & Plan Note (Signed)
On Vit D 

## 2015-09-08 NOTE — Patient Instructions (Signed)
   Postprocedure instructions :     Keep the wounds clean. You can wash them with liquid soap and water. Pat dry with gauze or a Kleenex tissue  Before applying antibiotic ointment and a Band-Aid.   You need to report immediately  if  any signs of infection develop.    

## 2015-09-08 NOTE — Assessment & Plan Note (Signed)
[place]on

## 2015-09-08 NOTE — Assessment & Plan Note (Signed)
On B12 

## 2015-09-08 NOTE — Progress Notes (Signed)
Spoke with Dr Loletha Grayer Jackson/anesthesia in regards to pts H&P; reviewed EKGs 09/05/2015 and 04/03/2012 (in epic and on chart). No orders given. Anesthesia to see pt day of surgery.

## 2015-09-08 NOTE — Assessment & Plan Note (Signed)
11/16 B thumbs See Procedure

## 2015-09-08 NOTE — Assessment & Plan Note (Signed)
Labs

## 2015-09-08 NOTE — Progress Notes (Signed)
Subjective:  Patient ID: Brian Mays, male    DOB: 07-Jun-1946  Age: 69 y.o. MRN: [id number]  CC: No chief complaint on file.   HPI Brian Mays presents for HTN, GERD, hypothyroidism f/u. F/u a neuroendocrine carcinoma. He has liver metastases. C/o warts  Outpatient Prescriptions Prior to Visit  Medication Sig Dispense Refill  . amLODipine (NORVASC) 5 MG tablet TAKE 1 TABLET DAILY 30 tablet 11  . aspirin EC 81 MG tablet Take 81 mg by mouth every morning.    . Cholecalciferol 1000 UNITS tablet Take 1,000 Units by mouth daily.      . fluticasone (FLONASE) 50 MCG/ACT nasal spray Place 2 sprays into both nostrils daily as needed (sinuses).   11  . hydrocortisone (ANUSOL-HC) 25 MG suppository INSERT 1 SUPPOSITORY RECTALLY TWICE A DAY AS NEEDED FOR HEMORRHOIDS 30 suppository 0  . Lanreotide Acetate (SOMATULINE DEPOT [place]) Inject 120 mcg into the skin every 28 (twenty-eight) days. Receives at Dr Antonieta Pert office    . lansoprazole (PREVACID) 30 MG capsule Take 1 capsule (30 mg total) by mouth daily at 12 noon. (Patient taking differently: Take 30 mg by mouth daily. ) 30 capsule 11  . levothyroxine (SYNTHROID, LEVOTHROID) 50 MCG tablet TAKE 1 TABLET DAILY. 90 tablet 2  . losartan (COZAAR) 100 MG tablet TAKE 1 TABLET BY MOUTH EVERY MORNING 90 tablet 3  . metoCLOPramide (REGLAN) 10 MG tablet Take 10 mg by mouth daily.    . Omega-3 Fatty Acids (FISH OIL) 1000 MG CAPS Take 1 capsule by mouth daily.    . Pancrelipase, Lip-Prot-Amyl, (CREON) 24000 UNITS CPEP 1 po tid with food (Patient taking differently: Take 1 capsule by mouth 3 (three) times daily with meals. ) 270 capsule 3  . PARoxetine (PAXIL) 10 MG tablet Take 1 tablet (10 mg total) by mouth daily. 30 tablet 3  . sodium chloride (OCEAN) 0.65 % SOLN nasal spray Place 1 spray into both nostrils as needed for congestion.     . vitamin B-12 (CYANOCOBALAMIN) 1000 MCG tablet Take 1,000 mcg by mouth daily.     No facility-administered medications  prior to visit.    ROS Review of Systems  Constitutional: Negative for appetite change, fatigue and unexpected weight change.  HENT: Negative for congestion, nosebleeds, sneezing, sore throat and trouble swallowing.   Eyes: Negative for itching and visual disturbance.  Respiratory: Negative for cough.   Cardiovascular: Negative for chest pain, palpitations and leg swelling.  Gastrointestinal: Negative for nausea, diarrhea, blood in stool and abdominal distention.  Genitourinary: Negative for frequency and hematuria.  Musculoskeletal: Negative for back pain, joint swelling, gait problem and neck pain.  Skin: Negative for rash.  Neurological: Negative for dizziness, tremors, speech difficulty and weakness.  Psychiatric/Behavioral: Negative for sleep disturbance, dysphoric mood and agitation. The patient is not nervous/anxious.     Objective:  BP 128/80 mmHg  Pulse 73  Wt 206 lb (93.441 kg)  SpO2 97%  BP Readings from Last 3 Encounters:  09/08/15 128/80  09/05/15 154/80  09/03/15 150/80    Wt Readings from Last 3 Encounters:  09/08/15 206 lb (93.441 kg)  09/05/15 204 lb 8 oz (92.761 kg)  09/03/15 205 lb 8 oz (93.214 kg)    Physical Exam  Constitutional: He is oriented to person, place, and time. He appears well-developed. No distress.  NAD  HENT:  Mouth/Throat: Oropharynx is clear and moist.  Eyes: Conjunctivae are normal. Pupils are equal, round, and reactive to light.  Neck: Normal range  of motion. No JVD present. No thyromegaly present.  Cardiovascular: Normal rate, regular rhythm, normal heart sounds and intact distal pulses.  Exam reveals no gallop and no friction rub.   No murmur heard. Pulmonary/Chest: Effort normal and breath sounds normal. No respiratory distress. He has no wheezes. He has no rales. He exhibits no tenderness.  Abdominal: Soft. Bowel sounds are normal. He exhibits no distension and no mass. There is no tenderness. There is no rebound and no  guarding.  Musculoskeletal: Normal range of motion. He exhibits no edema or tenderness.  Lymphadenopathy:    He has no cervical adenopathy.  Neurological: He is alert and oriented to person, place, and time. He has normal reflexes. No cranial nerve deficit. He exhibits normal muscle tone. He displays a negative Romberg sign. Coordination and gait normal.  Skin: Skin is warm and dry. No rash noted.  Psychiatric: He has a normal mood and affect. His behavior is normal. Judgment and thought content normal.  warts x2  Lab Results  Component Value Date   WBC 7.0 09/05/2015   HGB 13.0 09/05/2015   HCT 40.3 09/05/2015   PLT 171 09/05/2015   GLUCOSE 100* 09/05/2015   CHOL 164 03/07/2015   TRIG 95.0 03/07/2015   HDL 44.60 03/07/2015   LDLDIRECT 147.6 12/08/2007   LDLCALC 100* 03/07/2015   ALT 14* 09/05/2015   AST 28 09/05/2015   NA 139 09/05/2015   K 4.4 09/05/2015   CL 105 09/05/2015   CREATININE 0.74 09/05/2015   BUN 14 09/05/2015   CO2 26 09/05/2015   TSH 4.44 03/07/2015   PSA 0.50 03/07/2015   INR 1.00 09/05/2015   HGBA1C 5.8 03/07/2015    Procedure Note :     Procedure : Cryosurgery   Indication:  Wart(s)     Risks including unsuccessful procedure , bleeding, infection, bruising, scar, a need for a repeat  procedure and others were explained to the patient in detail as well as the benefits. Informed consent was obtained verbally.    2 lesion(s)  on    was/were treated with liquid nitrogen on a Q-tip in a usual fasion . Band-Aid was applied and antibiotic ointment was given for a later use.   Tolerated well. Complications none.    No results found.  Assessment & Plan:   There are no diagnoses linked to this encounter. I am having Brian Mays maintain his Cholecalciferol, aspirin EC, sodium chloride, PARoxetine, fluticasone, hydrocortisone, lansoprazole, losartan, amLODipine, Pancrelipase (Lip-Prot-Amyl), levothyroxine, vitamin B-12, metoCLOPramide, Lanreotide Acetate  (SOMATULINE DEPOT [place]), and Fish Oil.  No orders of the defined types were placed in this encounter.     Follow-up: No Follow-up on file.  Walker Kehr, MD

## 2015-09-08 NOTE — Assessment & Plan Note (Signed)
Dr Marin Olp Dr Kathlene Cote

## 2015-09-08 NOTE — Progress Notes (Signed)
Pre visit review using our clinic review tool, if applicable. No additional management support is needed unless otherwise documented below in the visit note. 

## 2015-09-09 LAB — CHROMOGRANIN A: Chromogranin A: 48 ng/mL — ABNORMAL HIGH (ref <=15)

## 2015-09-10 IMAGING — US CT GUIDANCE TISSUE ABLATION
1 series · 13 of 15 positions shown · non-contrast
Comparison: CT of the abdomen and pelvis on [DATE]

CLINICAL DATA: History of metastatic neuroendocrine carcinoma to
the liver and status post prior radioembolization treatment of the
liver with you yttrium 90. Recent imaging has demonstrated an
enlarging right hepatic metastasis which is amenable to percutaneous
thermal ablation based on size and location.

EXAM:
CT-GUIDED PERCUTANEOUS THERMAL ABLATION OF LIVER TUMOR

[Series 1: ct guidance tissue ablation · 0.14mm/px · 15 acquisitions, 13 frames shown]
[im 1/15]
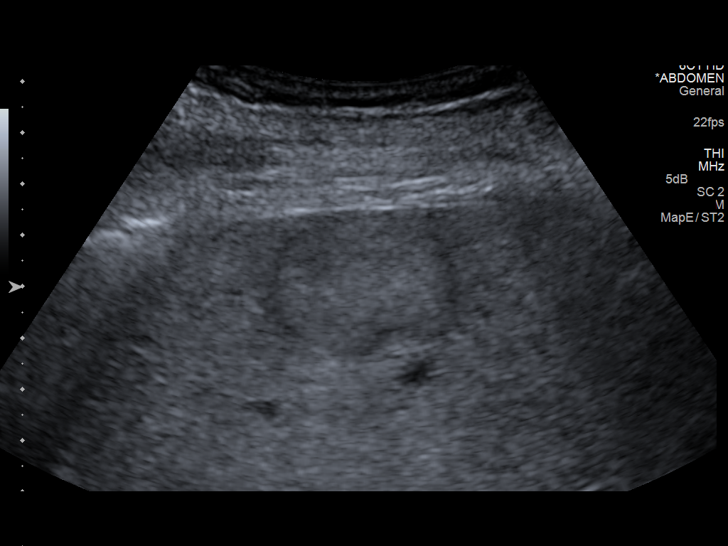
[im 2/15]
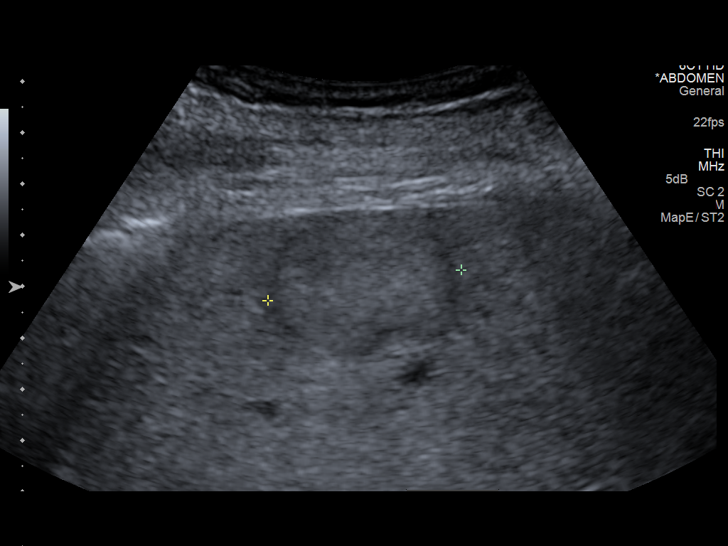
[im 3/15]
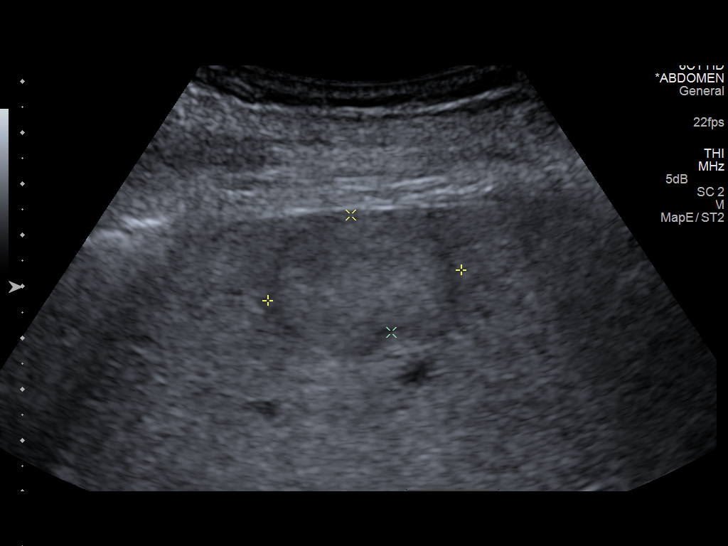
[im 5/15]
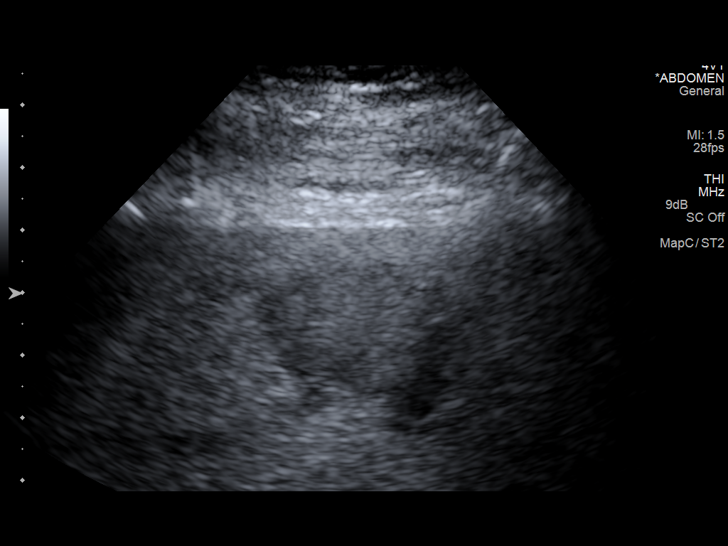
[im 6/15]
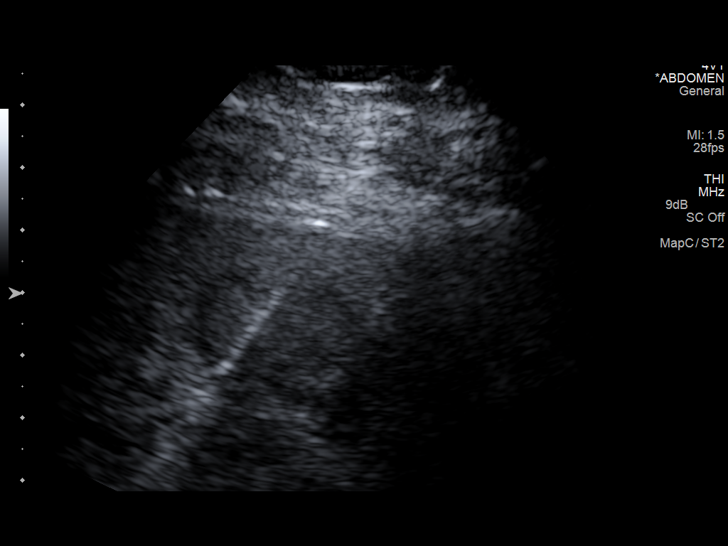
[im 7/15]
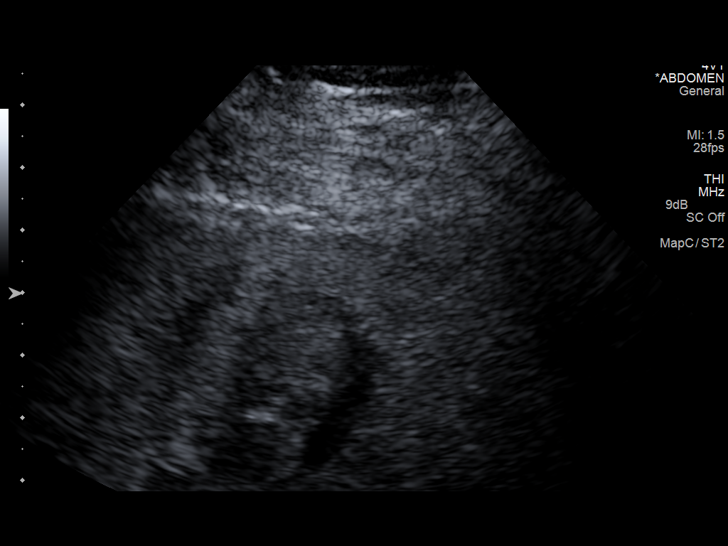
[im 8/15]
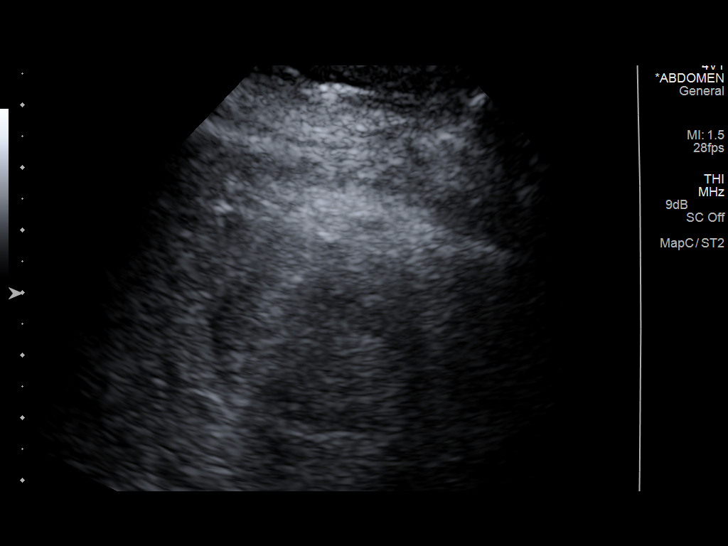
[im 9/15]
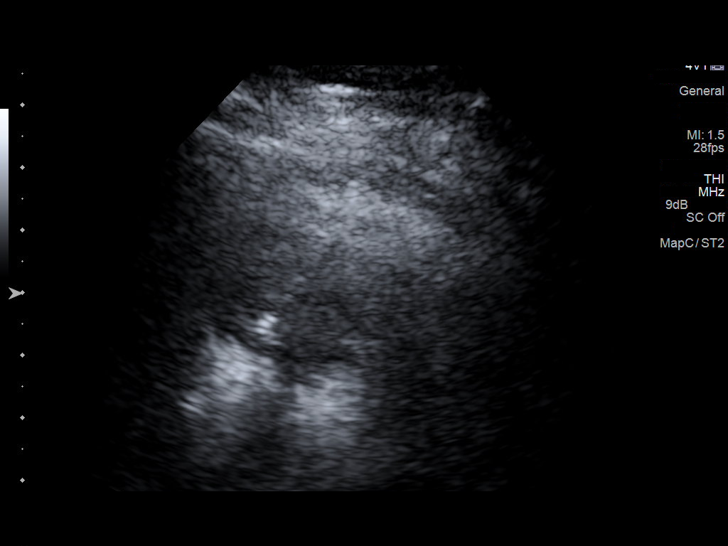
[im 10/15]
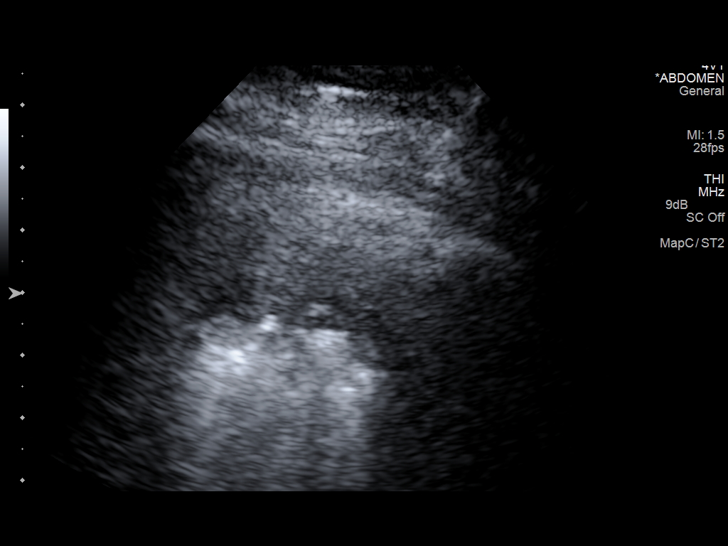
[im 11/15]
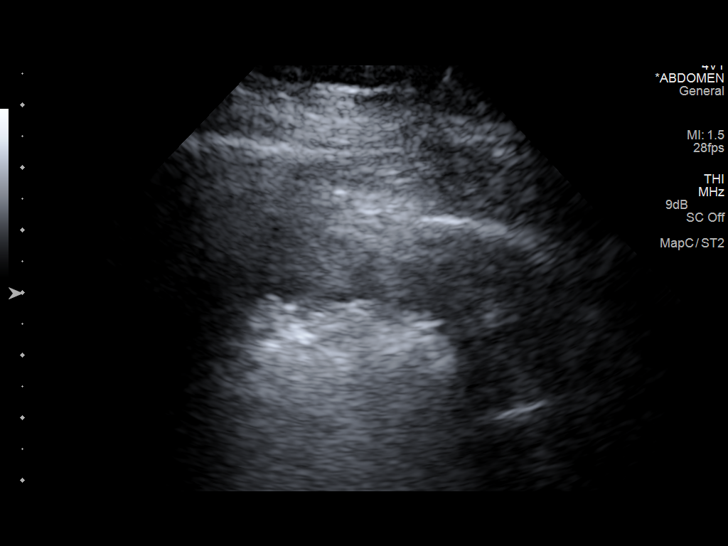
[im 13/15]
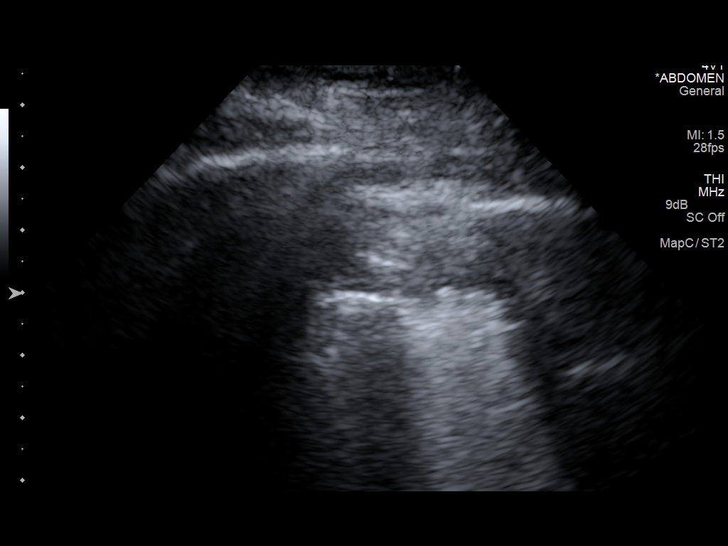
[im 14/15]
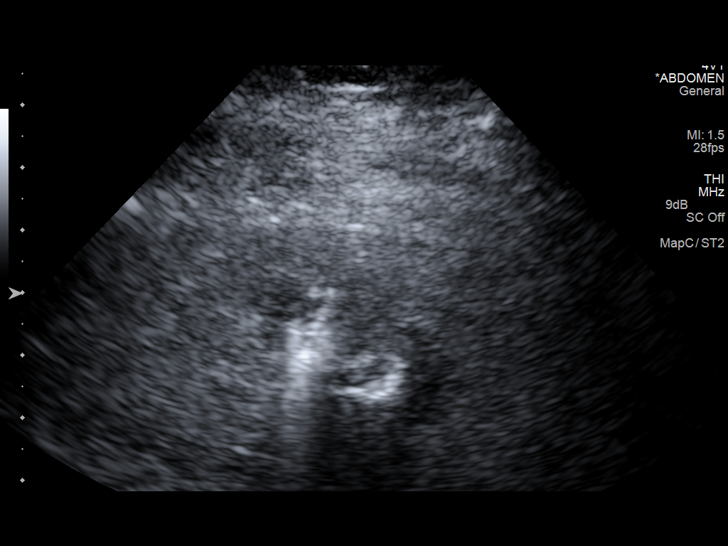
[im 15/15]
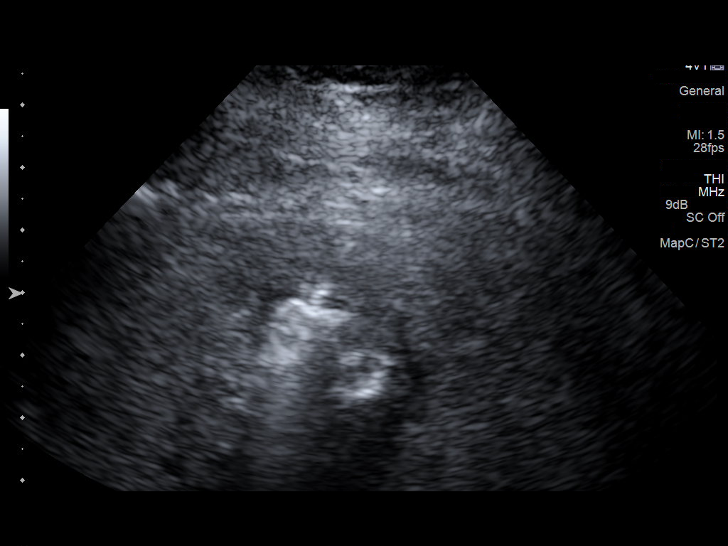

[13 of 15 positions shown; findings below may reference images not displayed]

ANESTHESIA/SEDATION:
Anesthesia:  General

Medications:  3.375 g IV Zosyn.

CONTRAST:  None.

PROCEDURE:
The procedure, risks, benefits, and alternatives were explained to
the patient. Questions regarding the procedure were encouraged and
answered. The patient understands and consents to the procedure. A
time-out was performed prior to the procedure.

The patient was placed under general anesthesia. Initial unenhanced
CT was performed in a supine and oblique position to localize the
liver. The right abdominal wall was prepped with Betadine in a
sterile fashion, and a sterile drape was applied covering the
operative field. A sterile gown and sterile gloves were used for the
procedure.

Under CT and all guidance, 2 separate NeuWave Precision PR XT probes
were placed into the right lobe of the liver. Probe positioning was
confirmed by CT prior to ablation.

Ablation was performed through the 2 probes simultaneously at 65 [address]
for 7 minutes. Monitoring of ablation was performed under real-time
ultrasound guidance. CT was also performed immediately following the
7 minutes ablation cycle. Tract cautery was performed as the probes
were removed.

COMPLICATIONS:
None
FINDINGS: The lesion within the lateral aspect of the right lobe of the liver
was well visualized by ultrasound, allowing probe placement. The
lesion measures approximately 2.4 x 3.8 cm. CT additionally
confirmed probe death prior to thermal ablation. There was good
distribution of gas in the lesion by ultrasound and CT during
thermal ablation.
IMPRESSION: CT guided percutaneous thermal ablation of enlarging right lobe
liver metastasis. The patient will be observed overnight. Initial
follow-up will be performed in approximately 4 weeks.

## 2015-09-11 ENCOUNTER — Other Ambulatory Visit: Payer: Self-pay | Admitting: Radiology

## 2015-09-11 NOTE — Anesthesia Preprocedure Evaluation (Signed)
Anesthesia Evaluation  Patient identified by MRN, date of birth, ID band Patient awake    Reviewed: Allergy & Precautions, NPO status , Patient's Chart, lab work & pertinent test results  Airway Mallampati: II  TM Distance: >3 FB Neck ROM: Full    Dental no notable dental hx.    Pulmonary shortness of breath, former smoker,    Pulmonary exam normal breath sounds clear to auscultation       Cardiovascular hypertension, Pt. on medications Normal cardiovascular exam+ Valvular Problems/Murmurs  Rhythm:Regular Rate:Normal     Neuro/Psych negative neurological ROS  negative psych ROS   GI/Hepatic Neg liver ROS, hiatal hernia, GERD  ,  Endo/Other  Hypothyroidism   Renal/GU negative Renal ROS     Musculoskeletal negative musculoskeletal ROS (+)   Abdominal   Peds  Hematology negative hematology ROS (+) anemia ,   Anesthesia Other Findings   Reproductive/Obstetrics                             Anesthesia Physical Anesthesia Plan  ASA: III  Anesthesia Plan: General   Post-op Pain Management:    Induction: Intravenous  Airway Management Planned: Oral ETT  Additional Equipment:   Intra-op Plan:   Post-operative Plan: Extubation in OR  Informed Consent: I have reviewed the patients History and Physical, chart, labs and discussed the procedure including the risks, benefits and alternatives for the proposed anesthesia with the patient or authorized representative who has indicated his/her understanding and acceptance.   Dental advisory given  Plan Discussed with: CRNA  Anesthesia Plan Comments:         Anesthesia Quick Evaluation

## 2015-09-12 ENCOUNTER — Encounter (HOSPITAL_COMMUNITY): Payer: Self-pay

## 2015-09-12 ENCOUNTER — Encounter (HOSPITAL_COMMUNITY): Payer: Self-pay | Admitting: General Practice

## 2015-09-12 ENCOUNTER — Ambulatory Visit (HOSPITAL_COMMUNITY): Payer: Medicare PPO | Admitting: Anesthesiology

## 2015-09-12 ENCOUNTER — Ambulatory Visit (HOSPITAL_COMMUNITY)
Admission: RE | Admit: 2015-09-12 | Discharge: 2015-09-12 | Disposition: A | Discharge status: Home / Self Care | Payer: Medicare PPO | Source: Ambulatory Visit | Attending: Interventional Radiology | Admitting: Interventional Radiology

## 2015-09-12 ENCOUNTER — Encounter (HOSPITAL_COMMUNITY)
Admission: RE | Disposition: A | Discharge status: Home / Self Care | Payer: Self-pay | Source: Ambulatory Visit | Attending: Interventional Radiology

## 2015-09-12 ENCOUNTER — Observation Stay (HOSPITAL_COMMUNITY)
Admission: RE | Admit: 2015-09-12 | Discharge: 2015-09-12 | Disposition: A | Discharge status: Home / Self Care | Payer: Medicare PPO | Source: Ambulatory Visit | Attending: Interventional Radiology | Admitting: Interventional Radiology

## 2015-09-12 DIAGNOSIS — R011 Cardiac murmur, unspecified: Secondary | ICD-10-CM | POA: Diagnosis not present

## 2015-09-12 DIAGNOSIS — Z9049 Acquired absence of other specified parts of digestive tract: Secondary | ICD-10-CM | POA: Insufficient documentation

## 2015-09-12 DIAGNOSIS — Z7982 Long term (current) use of aspirin: Secondary | ICD-10-CM | POA: Diagnosis not present

## 2015-09-12 DIAGNOSIS — Z888 Allergy status to other drugs, medicaments and biological substances status: Secondary | ICD-10-CM | POA: Insufficient documentation

## 2015-09-12 DIAGNOSIS — E039 Hypothyroidism, unspecified: Secondary | ICD-10-CM | POA: Diagnosis not present

## 2015-09-12 DIAGNOSIS — C787 Secondary malignant neoplasm of liver and intrahepatic bile duct: Secondary | ICD-10-CM | POA: Diagnosis not present

## 2015-09-12 DIAGNOSIS — Z79899 Other long term (current) drug therapy: Secondary | ICD-10-CM | POA: Insufficient documentation

## 2015-09-12 DIAGNOSIS — Z86010 Personal history of colonic polyps: Secondary | ICD-10-CM | POA: Insufficient documentation

## 2015-09-12 DIAGNOSIS — Z87891 Personal history of nicotine dependence: Secondary | ICD-10-CM | POA: Insufficient documentation

## 2015-09-12 DIAGNOSIS — I10 Essential (primary) hypertension: Secondary | ICD-10-CM | POA: Insufficient documentation

## 2015-09-12 DIAGNOSIS — C7A8 Other malignant neuroendocrine tumors: Secondary | ICD-10-CM

## 2015-09-12 DIAGNOSIS — C7B8 Other secondary neuroendocrine tumors: Principal | ICD-10-CM

## 2015-09-12 DIAGNOSIS — K219 Gastro-esophageal reflux disease without esophagitis: Secondary | ICD-10-CM | POA: Diagnosis not present

## 2015-09-12 LAB — TYPE AND SCREEN
ABO/RH(D): A NEG
ANTIBODY SCREEN: NEGATIVE

## 2015-09-12 IMAGING — CT CT GUIDANCE TISSUE ABLATION
2 of 4 series · 4 of 12 positions shown, 7 images · non-contrast
Comparison: CT of the abdomen and pelvis on [DATE]

CLINICAL DATA: History of metastatic neuroendocrine carcinoma to
the liver and status post prior radioembolization treatment of the
liver with you yttrium 90. Recent imaging has demonstrated an
enlarging right hepatic metastasis which is amenable to percutaneous
thermal ablation based on size and location.

EXAM:
CT-GUIDED PERCUTANEOUS THERMAL ABLATION OF LIVER TUMOR

[Series 6: add scan 5.0 b40f · axial · 0.54mm/px · z∈[-74,-69]mm · 2 of 4 slices shown, 5 images (1 of 2)]
[im 2/4  soft-tissue]
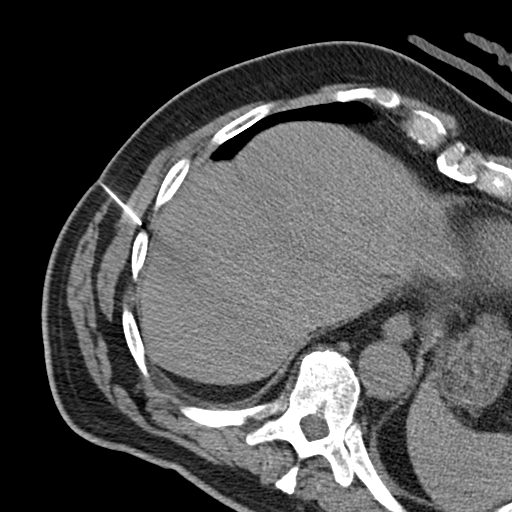
[im 2/4  lung]
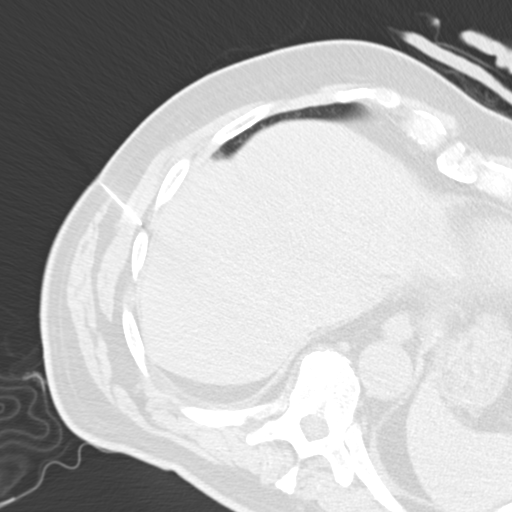
[im 2/4  bone]
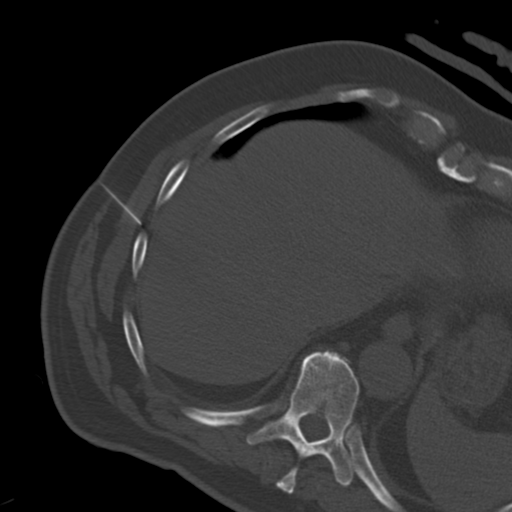
[im 3/4  soft-tissue]
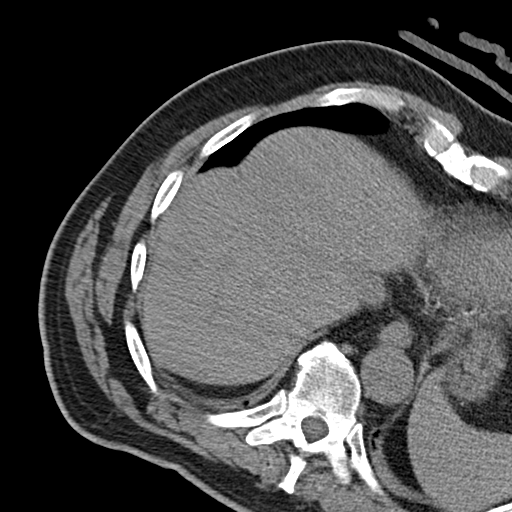
[im 3/4  lung]
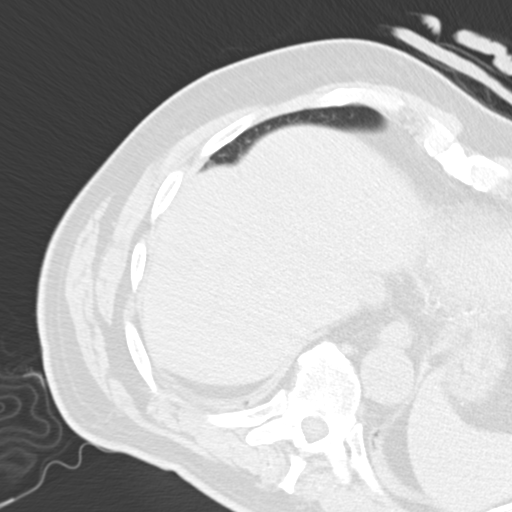

[Series 7: add scan 5.0 b40f · axial · 0.54mm/px · z∈[-74,-69]mm · 2 of 4 slices shown (2 of 2)]
[im 2/4  soft-tissue]
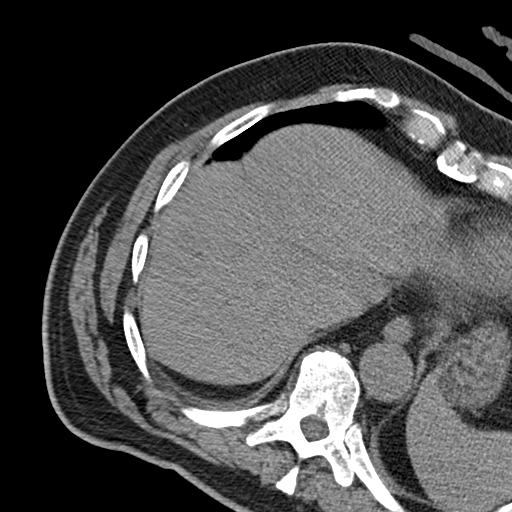
[im 3/4  soft-tissue]
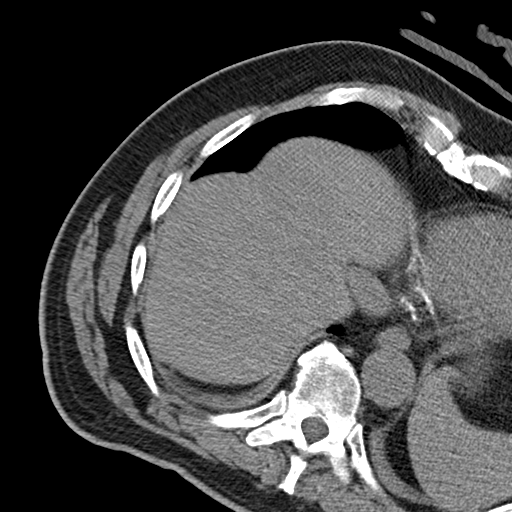

[4 of 12 positions shown; findings below may reference images not displayed]

ANESTHESIA/SEDATION:
Anesthesia:  General

Medications:  3.375 g IV Zosyn.

CONTRAST:  None.

PROCEDURE:
The procedure, risks, benefits, and alternatives were explained to
the patient. Questions regarding the procedure were encouraged and
answered. The patient understands and consents to the procedure. A
time-out was performed prior to the procedure.

The patient was placed under general anesthesia. Initial unenhanced
CT was performed in a supine and oblique position to localize the
liver. The right abdominal wall was prepped with Betadine in a
sterile fashion, and a sterile drape was applied covering the
operative field. A sterile gown and sterile gloves were used for the
procedure.

Under CT and all guidance, 2 separate NeuWave Precision PR XT probes
were placed into the right lobe of the liver. Probe positioning was
confirmed by CT prior to ablation.

Ablation was performed through the 2 probes simultaneously at 65 W
for 7 minutes. Monitoring of ablation was performed under real-time
ultrasound guidance. CT was also performed immediately following the
7 minutes ablation cycle. Tract cautery was performed as the probes
were removed.

COMPLICATIONS:
None
FINDINGS: The lesion within the lateral aspect of the right lobe of the liver
was well visualized by ultrasound, allowing probe placement. The
lesion measures approximately 2.4 x 3.8 cm. CT additionally
confirmed probe death prior to thermal ablation. There was good
distribution of gas in the lesion by ultrasound and CT during
thermal ablation.
IMPRESSION: CT guided percutaneous thermal ablation of enlarging right lobe
liver metastasis. The patient will be observed overnight. Initial
follow-up will be performed in approximately 4 weeks.

## 2015-09-12 SURGERY — RADIO FREQUENCY ABLATION
Anesthesia: General | Laterality: Right

## 2015-09-12 MED ORDER — EPHEDRINE SULFATE 50 MG/ML IJ SOLN
INTRAMUSCULAR | Status: DC | PRN
  Administered 2015-09-12 (×2): 10 mg via INTRAVENOUS
  Administered 2015-09-12 (×2): 5 mg via INTRAVENOUS

## 2015-09-12 MED ORDER — PIPERACILLIN-TAZOBACTAM 3.375 G IVPB
3.3750 g | Freq: Once | INTRAVENOUS | Status: AC
  Administered 2015-09-12: 3.375 g via INTRAVENOUS
  Filled 2015-09-12: qty 50

## 2015-09-12 MED ORDER — LOSARTAN POTASSIUM 50 MG PO TABS
100.0000 mg | ORAL_TABLET | Freq: Every morning | ORAL | Status: DC
  Administered 2015-09-12: 100 mg via ORAL
  Filled 2015-09-12: qty 2

## 2015-09-12 MED ORDER — PROPOFOL 10 MG/ML IV BOLUS
INTRAVENOUS | Status: DC | PRN
  Administered 2015-09-12: 150 mg via INTRAVENOUS

## 2015-09-12 MED ORDER — NEOSTIGMINE METHYLSULFATE 10 MG/10ML IV SOLN
INTRAVENOUS | Status: DC | PRN
  Administered 2015-09-12: 4 mg via INTRAVENOUS

## 2015-09-12 MED ORDER — SALINE SPRAY 0.65 % NA SOLN
1.0000 | NASAL | Status: DC | PRN
  Filled 2015-09-12: qty 44

## 2015-09-12 MED ORDER — ROCURONIUM BROMIDE 100 MG/10ML IV SOLN
INTRAVENOUS | Status: DC | PRN
  Administered 2015-09-12 (×2): 10 mg via INTRAVENOUS
  Administered 2015-09-12: 40 mg via INTRAVENOUS
  Administered 2015-09-12 (×2): 10 mg via INTRAVENOUS

## 2015-09-12 MED ORDER — ASPIRIN EC 81 MG PO TBEC: 81.0000 mg | DELAYED_RELEASE_TABLET | Freq: Every morning | ORAL | Status: DC

## 2015-09-12 MED ORDER — PHENYLEPHRINE HCL 10 MG/ML IJ SOLN
INTRAMUSCULAR | Status: DC | PRN
  Administered 2015-09-12 (×3): 40 ug via INTRAVENOUS

## 2015-09-12 MED ORDER — KETOROLAC TROMETHAMINE 30 MG/ML IJ SOLN: 30.0000 mg | Freq: Four times a day (QID) | INTRAMUSCULAR | Status: DC

## 2015-09-12 MED ORDER — LACTATED RINGERS IV SOLN
INTRAVENOUS | Status: DC
  Administered 2015-09-12: 07:00:00 via INTRAVENOUS

## 2015-09-12 MED ORDER — HYDROMORPHONE HCL 1 MG/ML IJ SOLN
INTRAMUSCULAR | Status: DC | PRN
  Administered 2015-09-12: 1 mg via INTRAVENOUS

## 2015-09-12 MED ORDER — AMLODIPINE BESYLATE 5 MG PO TABS: 5.0000 mg | ORAL_TABLET | Freq: Every day | ORAL | Status: DC

## 2015-09-12 MED ORDER — HYDROMORPHONE HCL 2 MG/ML IJ SOLN
INTRAMUSCULAR | Status: AC
  Filled 2015-09-12: qty 1

## 2015-09-12 MED ORDER — SODIUM CHLORIDE 0.9 % IV SOLN
INTRAVENOUS | Status: DC
  Administered 2015-09-12: 12:00:00 via INTRAVENOUS

## 2015-09-12 MED ORDER — SENNOSIDES-DOCUSATE SODIUM 8.6-50 MG PO TABS
1.0000 | ORAL_TABLET | Freq: Every day | ORAL | Status: DC | PRN
  Filled 2015-09-12: qty 1

## 2015-09-12 MED ORDER — LEVOTHYROXINE SODIUM 50 MCG PO TABS: 50.0000 ug | ORAL_TABLET | Freq: Every day | ORAL | Status: DC

## 2015-09-12 MED ORDER — FENTANYL CITRATE (PF) 100 MCG/2ML IJ SOLN
INTRAMUSCULAR | Status: DC | PRN
  Administered 2015-09-12: 100 ug via INTRAVENOUS
  Administered 2015-09-12 (×3): 50 ug via INTRAVENOUS

## 2015-09-12 MED ORDER — DOCUSATE SODIUM 100 MG PO CAPS
100.0000 mg | ORAL_CAPSULE | Freq: Two times a day (BID) | ORAL | Status: DC
  Filled 2015-09-12 (×3): qty 1

## 2015-09-12 MED ORDER — VITAMIN D3 25 MCG (1000 UNIT) PO TABS
1000.0000 [IU] | ORAL_TABLET | Freq: Every day | ORAL | Status: DC
  Administered 2015-09-12: 1000 [IU] via ORAL
  Filled 2015-09-12 (×2): qty 1

## 2015-09-12 MED ORDER — PAROXETINE HCL 20 MG PO TABS: 10.0000 mg | ORAL_TABLET | Freq: Every day | ORAL | Status: DC

## 2015-09-12 MED ORDER — MIDAZOLAM HCL 5 MG/5ML IJ SOLN
INTRAMUSCULAR | Status: DC | PRN
  Administered 2015-09-12: 2 mg via INTRAVENOUS

## 2015-09-12 MED ORDER — VITAMIN B-12 1000 MCG PO TABS
1000.0000 ug | ORAL_TABLET | Freq: Every day | ORAL | Status: DC
  Filled 2015-09-12: qty 1

## 2015-09-12 MED ORDER — KETOROLAC TROMETHAMINE 30 MG/ML IJ SOLN
30.0000 mg | Freq: Four times a day (QID) | INTRAMUSCULAR | Status: DC
  Administered 2015-09-12: 30 mg via INTRAVENOUS
  Filled 2015-09-12: qty 1

## 2015-09-12 MED ORDER — GLYCOPYRROLATE 0.2 MG/ML IJ SOLN
INTRAMUSCULAR | Status: DC | PRN
  Administered 2015-09-12: 0.6 mg via INTRAVENOUS

## 2015-09-12 MED ORDER — LIDOCAINE HCL (CARDIAC) 20 MG/ML IV SOLN
INTRAVENOUS | Status: DC | PRN
Start: 2015-09-12 — End: 2015-09-12
  Administered 2015-09-12: 50 mg via INTRAVENOUS

## 2015-09-12 MED ORDER — HYDROCODONE-ACETAMINOPHEN 5-325 MG PO TABS: 1.0000 | ORAL_TABLET | ORAL | Status: DC | PRN

## 2015-09-12 MED ORDER — FLUTICASONE PROPIONATE 50 MCG/ACT NA SUSP: 2.0000 | Freq: Every day | NASAL | Status: DC | PRN

## 2015-09-12 MED ORDER — MIDAZOLAM HCL 2 MG/2ML IJ SOLN
INTRAMUSCULAR | Status: AC
  Filled 2015-09-12: qty 4

## 2015-09-12 MED ORDER — FENTANYL CITRATE (PF) 250 MCG/5ML IJ SOLN
INTRAMUSCULAR | Status: AC
Start: 2015-09-12 — End: 2015-09-12
  Filled 2015-09-12: qty 25

## 2015-09-12 MED ORDER — ONDANSETRON HCL 4 MG/2ML IJ SOLN: 4.0000 mg | Freq: Four times a day (QID) | INTRAMUSCULAR | Status: DC | PRN

## 2015-09-12 NOTE — Progress Notes (Signed)
Discharge instructions and prescription given to pt. No current questions or concerns. Aware of his next appointments. Pt has ambulated and voided without cath within the past 2 hours. Will be leaving shortly

## 2015-09-12 NOTE — Progress Notes (Signed)
Referring Physician(s): Ennever  Chief Complaint:  Enlarging hepatic metastasis  Subjective:  Mr Warstler was asleep when I entered the room, but he awakened easily to voice. He states he feels pretty good and has no pain at all  He is requesting to go home.  Allergies: Iohexol  Medications: Prior to Admission medications   Medication Sig Start Date End Date Taking? Authorizing Provider  amLODipine (NORVASC) 5 MG tablet TAKE 1 TABLET DAILY 05/30/15  Yes Aleksei Plotnikov V, MD  aspirin EC 81 MG tablet Take 81 mg by mouth every morning.   Yes Historical Provider, MD  Cholecalciferol 1000 UNITS tablet Take 1,000 Units by mouth daily.     Yes Historical Provider, MD  fluticasone (FLONASE) 50 MCG/ACT nasal spray Place 2 sprays into both nostrils daily as needed (sinuses).  01/06/15  Yes Historical Provider, MD  hydrocortisone (ANUSOL-HC) 25 MG suppository INSERT 1 SUPPOSITORY RECTALLY TWICE A DAY AS NEEDED FOR HEMORRHOIDS 02/07/15  Yes [place], NP  Lanreotide Acetate (SOMATULINE DEPOT [place]) Inject 120 mcg into the skin every 28 (twenty-eight) days. Receives at Dr Antonieta Pert office   Yes Historical Provider, MD  lansoprazole (PREVACID) 30 MG capsule Take 1 capsule (30 mg total) by mouth daily at 12 noon. Patient taking differently: Take 30 mg by mouth daily.  03/06/15  Yes Aleksei Plotnikov V, MD  levothyroxine (SYNTHROID, LEVOTHROID) 50 MCG tablet TAKE 1 TABLET DAILY. 07/30/15  Yes Aleksei Plotnikov V, MD  losartan (COZAAR) 100 MG tablet TAKE 1 TABLET BY MOUTH EVERY MORNING 03/14/15  Yes Volanda Napoleon, MD  Omega-3 Fatty Acids (FISH OIL) 1000 MG CAPS Take 1 capsule by mouth daily.   Yes Historical Provider, MD  Pancrelipase, Lip-Prot-Amyl, (CREON) 24000 UNITS CPEP 1 po tid with food Patient taking differently: Take 1 capsule by mouth 3 (three) times daily with meals.  07/30/15  Yes Aleksei Plotnikov V, MD  PARoxetine (PAXIL) 10 MG tablet Take 1 tablet (10 mg total) by mouth daily.  11/13/14  Yes Volanda Napoleon, MD  sodium chloride (OCEAN) 0.65 % SOLN nasal spray Place 1 spray into both nostrils as needed for congestion.    Yes Historical Provider, MD  vitamin B-12 (CYANOCOBALAMIN) 1000 MCG tablet Take 1,000 mcg by mouth daily.   Yes Historical Provider, MD  metoCLOPramide (REGLAN) 10 MG tablet Take 10 mg by mouth daily.    Historical Provider, MD     Vital Signs: BP 134/72 mmHg  Pulse 72  Temp(Src) 97.9 F (36.6 C) (Oral)  Resp 18  Ht 5' 10.5" (1.791 m)  Wt 206 lb (93.441 kg)  BMI 29.13 kg/m2  SpO2 98%  Physical Exam   Asleep upon my entry, but awakened easily to voice. Right side site looks good No abdominal tenderness  Imaging: Ct Guide Tissue Ablation  09/12/2015  CLINICAL DATA:  History of metastatic neuroendocrine carcinoma to the liver and status post prior radioembolization treatment of the liver with you yttrium 90. Recent imaging has demonstrated an enlarging right hepatic metastasis which is amenable to percutaneous thermal ablation based on size and location. EXAM: CT-GUIDED PERCUTANEOUS THERMAL ABLATION OF LIVER TUMOR COMPARISON:  CT of the abdomen and pelvis on 07/14/2015 ANESTHESIA/SEDATION: Anesthesia:  General Medications:  3.375 g IV Zosyn. CONTRAST:  None. PROCEDURE: The procedure, risks, benefits, and alternatives were explained to the patient. Questions regarding the procedure were encouraged and answered. The patient understands and consents to the procedure. A time-out was performed prior to the procedure. The patient was  placed under general anesthesia. Initial unenhanced CT was performed in a supine and oblique position to localize the liver. The right abdominal wall was prepped with Betadine in a sterile fashion, and a sterile drape was applied covering the operative field. A sterile gown and sterile gloves were used for the procedure. Under CT and all guidance, 2 separate NeuWave Precision PR XT probes were placed into the right lobe of the  liver. Probe positioning was confirmed by CT prior to ablation. Ablation was performed through the 2 probes simultaneously at 42 W for 7 minutes. Monitoring of ablation was performed under real-time ultrasound guidance. CT was also performed immediately following the 7 minutes ablation cycle. Tract cautery was performed as the probes were removed. COMPLICATIONS: None FINDINGS: The lesion within the lateral aspect of the right lobe of the liver was well visualized by ultrasound, allowing probe placement. The lesion measures approximately 2.4 x 3.8 cm. CT additionally confirmed probe death prior to thermal ablation. There was good distribution of gas in the lesion by ultrasound and CT during thermal ablation. IMPRESSION: CT guided percutaneous thermal ablation of enlarging right lobe liver metastasis. The patient will be observed overnight. Initial follow-up will be performed in approximately 4 weeks. Electronically Signed   By: Aletta Edouard M.D.   On: 09/12/2015 14:52    Labs:  CBC:  Recent Labs  06/11/15 1417 07/14/15 1114 09/03/15 1319 09/05/15 1050  WBC 6.6 8.8 7.0 7.0  HGB 13.4 13.4 13.4 13.0  HCT 40.3 41.1 41.1 40.3  PLT 186 169 175 171    COAGS:  Recent Labs  09/05/15 1050  INR 1.00  APTT 28    BMP:  Recent Labs  03/07/15 0824  06/11/15 1417 07/14/15 1114 09/03/15 1319 09/05/15 1050  NA 141  < > 139 140 142 139  K 4.3  < > 4.1 4.1 3.8 4.4  CL 103  --  104 100  --  105  CO2 31  < > 27 28 30* 26  GLUCOSE 98  < > 117* 131* 104 100*  BUN 13  < > 17 14 13.2 14  CALCIUM 9.3  < > 9.3 8.7 9.7 9.0  CREATININE 0.86  < > 0.84 1.0 0.9 0.74  GFRNONAA  --   --   --   --   --  >60  GFRAA  --   --   --   --   --  >60  < > = values in this interval not displayed.  LIVER FUNCTION TESTS:  Recent Labs  06/11/15 1417 07/14/15 1114 09/03/15 1319 09/05/15 1050  BILITOT 0.6 0.80 0.63 0.8  AST 24 32 24 28  ALT 14 21 15  14*  ALKPHOS 54 57 66 56  PROT 7.2 8.0 8.0 7.8    ALBUMIN 3.6 3.3 3.5 3.5    Assessment and Plan:  Enlarging right hepatic metastasis CT guided thermal ablation done today.  Patient states he feels good and is requesting to go home  Remove Foley Ambulate in hallway  If doing well, can void and ambulate well, possible discharge to home this evening.  Signed: Murrell Redden PA-C 09/12/2015, 4:13 PM   I spent a total of 15 Minutes at the the patient's bedside AND on the patient's hospital floor or unit, greater than 50% of which was counseling/coordinating care for f/u after thermal ablation of hepatic mets.

## 2015-09-12 NOTE — Anesthesia Procedure Notes (Signed)
Procedure Name: Intubation Date/Time: 09/12/2015 8:43 AM Performed by: Anne Fu Pre-anesthesia Checklist: Patient identified, Emergency Drugs available, Suction available, Patient being monitored and Timeout performed Patient Re-evaluated:Patient Re-evaluated prior to inductionOxygen Delivery Method: Circle system utilized Preoxygenation: Pre-oxygenation with 100% oxygen Intubation Type: IV induction Ventilation: Mask ventilation without difficulty Laryngoscope Size: Mac and 4 Grade View: Grade I Tube type: Oral Tube size: 7.5 mm Number of attempts: 1 Airway Equipment and Method: Stylet Placement Confirmation: ETT inserted through vocal cords under direct vision,  positive ETCO2,  CO2 detector and breath sounds checked- equal and bilateral Secured at: 23 cm Tube secured with: Tape Dental Injury: Teeth and Oropharynx as per pre-operative assessment

## 2015-09-12 NOTE — Transfer of Care (Signed)
Immediate Anesthesia Transfer of Care Note  Patient: Brian Mays  Procedure(s) Performed: Procedure(s): RIGHT LOBE LIVER MICROWAVE THERMAL ABLATION (Right)  Patient Location: PACU  Anesthesia Type:General  Level of Consciousness:  sedated, patient cooperative and responds to stimulation  Airway & Oxygen Therapy:Patient Spontanous Breathing and Patient connected to face mask oxgen  Post-op Assessment:  Report given to PACU RN and Post -op Vital signs reviewed and stable  Post vital signs:  Reviewed and stable  Last Vitals:  Filed Vitals:   09/12/15 0630  BP: 136/78  Pulse: 65  Temp: 36.6 C  Resp: 18    Complications: No apparent anesthesia complications

## 2015-09-12 NOTE — Progress Notes (Signed)
Pt currently sleeping and family has left for now. Pt's only complaint thus far has been about his dry eyes. MD paged. Pt responsive to voice and alert.

## 2015-09-12 NOTE — H&P (Signed)
Chief Complaint: Enlarging right lobe liver lesion and history of metastatic neuroendocrine carcinoma to the liver with previous Yttrium-90 radioembolization  Referring Physician(s): Burney Gauze  History of Present Illness: Brian Mays is a 69 y.o. male with a history of metastatic neuroendocrine carcinoma to the liver.   He is now status post yttrium-90 radioembolization procedures with treatment to the right lobe of the liver on 05/26/2010 and 12/20/2013; left lobe radioembolization on 03/26/2014.   Recently, chromogranin A level has risen up to 96 3 months ago. This represents rise over the last year from a low value of 18 on 04/26/2014.   Follow-up imaging has demonstrated stable lesions in the left and right lobe up to more recently on the last CT of 07/14/2015 which demonstrated enlargement of a segment 7 liver lesion over 6 months. Other disease was felt to be stable.  He is here today to undergo thermal ablation of the lesion in segment 7 of the right lobe   Past Medical History  Diagnosis Date  . GERD (gastroesophageal reflux disease)   . Pancreatitis 1998    chronic  . HTN (hypertension)   . Hemorrhoid   . Hypothyroidism   . Colon polyps   . Metastatic carcinoma ([place]) 2010    Dr Jonette Eva  . Diverticulosis   . Gallstones   . Hiatal hernia   . Heart murmur   . Anemia     Past Surgical History  Procedure Laterality Date  . Cholecystectomy  1999  . Carcinoil resection  01/2009  . Appendectomy  1964    Allergies: Iohexol  Medications: Prior to Admission medications   Medication Sig Start Date End Date Taking? Authorizing Provider  amLODipine (NORVASC) 5 MG tablet TAKE 1 TABLET DAILY 05/30/15   Lew Dawes V, MD  aspirin EC 81 MG tablet Take 81 mg by mouth every morning.    Historical Provider, MD  Cholecalciferol 1000 UNITS tablet Take 1,000 Units by mouth daily.      Historical Provider, MD  fluticasone (FLONASE) 50 MCG/ACT nasal spray Place 2 sprays  into both nostrils daily as needed (sinuses).  01/06/15   Historical Provider, MD  hydrocortisone (ANUSOL-HC) 25 MG suppository INSERT 1 SUPPOSITORY RECTALLY TWICE A DAY AS NEEDED FOR HEMORRHOIDS 02/07/15   Eliezer Bottom, NP  Lanreotide Acetate (SOMATULINE DEPOT [place]) Inject 120 mcg into the skin every 28 (twenty-eight) days. Receives at Dr Antonieta Pert office    Historical Provider, MD  lansoprazole (PREVACID) 30 MG capsule Take 1 capsule (30 mg total) by mouth daily at 12 noon. Patient taking differently: Take 30 mg by mouth daily.  03/06/15   Aleksei Plotnikov V, MD  levothyroxine (SYNTHROID, LEVOTHROID) 50 MCG tablet TAKE 1 TABLET DAILY. 07/30/15   Aleksei Plotnikov V, MD  losartan (COZAAR) 100 MG tablet TAKE 1 TABLET BY MOUTH EVERY MORNING 03/14/15   Volanda Napoleon, MD  metoCLOPramide (REGLAN) 10 MG tablet Take 10 mg by mouth daily.    Historical Provider, MD  Omega-3 Fatty Acids (FISH OIL) 1000 MG CAPS Take 1 capsule by mouth daily.    Historical Provider, MD  Pancrelipase, Lip-Prot-Amyl, (CREON) 24000 UNITS CPEP 1 po tid with food Patient taking differently: Take 1 capsule by mouth 3 (three) times daily with meals.  07/30/15   Aleksei Plotnikov V, MD  PARoxetine (PAXIL) 10 MG tablet Take 1 tablet (10 mg total) by mouth daily. 11/13/14   Volanda Napoleon, MD  sodium chloride (OCEAN) 0.65 % SOLN nasal spray Place  1 spray into both nostrils as needed for congestion.     Historical Provider, MD  vitamin B-12 (CYANOCOBALAMIN) 1000 MCG tablet Take 1,000 mcg by mouth daily.    Historical Provider, MD     Family History  Problem Relation Age of Onset  . Kidney disease Mother   . Hyperlipidemia Mother   . Hypertension Mother   . COPD Father   . Ulcerative colitis Daughter     Social History   Social History  . Marital Status: Married    Spouse Name: N/A  . Number of Children: 1  . Years of Education: N/A   Occupational History  . Sales    Social History Main Topics  . Smoking status: Former  Smoker -- 1.50 packs/day for 35 years    Types: Cigarettes    Start date: 09/25/1959    Quit date: 11/01/1997  . Smokeless tobacco: Never Used     Comment: quit smoking 18 years ago  . Alcohol Use: 0.0 oz/week    0 Standard drinks or equivalent per week     Comment: 4 beers weekly   . Drug Use: No  . Sexual Activity: Yes   Other Topics Concern  . Not on file   Social History Narrative   Regular Exercise -  NO    Review of Systems  Constitutional: Negative for fever, chills, activity change, appetite change and fatigue.  HENT: Negative.   Respiratory: Negative for cough, chest tightness and shortness of breath.   Cardiovascular: Negative for chest pain.  Gastrointestinal: Negative for nausea, vomiting, abdominal pain and abdominal distention.  Genitourinary: Negative.   Musculoskeletal: Negative.   Skin: Negative.   Neurological: Negative.   Psychiatric/Behavioral: Negative.     Vital Signs: BP 128/80 Pulse 73 SpO2 97   Physical Exam  Constitutional: He is oriented to person, place, and time. He appears well-developed and well-nourished.  HENT:  Head: Normocephalic and atraumatic.  Eyes: EOM are normal.  Neck: Normal range of motion. Neck supple.  Cardiovascular: Normal rate and regular rhythm.   Murmur heard. Pulmonary/Chest: Effort normal and breath sounds normal. No respiratory distress.  Abdominal: Soft. Bowel sounds are normal. He exhibits no distension. There is no tenderness.  Musculoskeletal: Normal range of motion.  Neurological: He is alert and oriented to person, place, and time.  Skin: Skin is warm and dry.  Psychiatric: He has a normal mood and affect. His behavior is normal. Judgment and thought content normal.  Vitals reviewed.   Mallampati Score:  MD Evaluation Airway: WNL Heart: Other (comments) Heart  comments: Known systolic murmur Abdomen: WNL Chest/ Lungs: WNL ASA  Classification: 3 Mallampati/Airway Score: Two  Imaging: No  results found.  Labs:  CBC:  Recent Labs  06/11/15 1417 07/14/15 1114 09/03/15 1319 09/05/15 1050  WBC 6.6 8.8 7.0 7.0  HGB 13.4 13.4 13.4 13.0  HCT 40.3 41.1 41.1 40.3  PLT 186 169 175 171    COAGS:  Recent Labs  09/05/15 1050  INR 1.00  APTT 28    BMP:  Recent Labs  03/07/15 0824  06/11/15 1417 07/14/15 1114 09/03/15 1319 09/05/15 1050  NA 141  < > 139 140 142 139  K 4.3  < > 4.1 4.1 3.8 4.4  CL 103  --  104 100  --  105  CO2 31  < > 27 28 30* 26  GLUCOSE 98  < > 117* 131* 104 100*  BUN 13  < > 17 14 13.2 14  CALCIUM 9.3  < > 9.3 8.7 9.7 9.0  CREATININE 0.86  < > 0.84 1.0 0.9 0.74  GFRNONAA  --   --   --   --   --  >60  GFRAA  --   --   --   --   --  >60  < > = values in this interval not displayed.  LIVER FUNCTION TESTS:  Recent Labs  06/11/15 1417 07/14/15 1114 09/03/15 1319 09/05/15 1050  BILITOT 0.6 0.80 0.63 0.8  AST 24 32 24 28  ALT 14 21 15  14*  ALKPHOS 54 57 66 56  PROT 7.2 8.0 8.0 7.8  ALBUMIN 3.6 3.3 3.5 3.5    TUMOR MARKERS:  Recent Labs  04/11/15 0956 06/11/15 1417 07/14/15 1115 09/03/15 1319  CHROMGRNA 34* 96* 17* 48*    Assessment and Plan:  Enlarging lesion in segment 7  Will proceed today with CT guided thermal ablation by Dr. Kathlene Cote.  Risks and Benefits discussed with the patient including, but not limited to bleeding, infection, vascular injury, post procedural pain, nausea, vomiting and fatigue, contrast induced renal failure, liver failure, thermal injury to the bowel, neutropenia and possible need for additional procedures.  All of the patient's questions were answered, patient is agreeable to proceed. Consent signed and in chart.   Signed: Murrell Redden PA-C 09/12/2015, 8:17 AM   I spent a total of  15 Minutes in face to face in clinical consultation, greater than 50% of which was counseling/coordinating care for thermal ablation of liver tumor.

## 2015-09-12 NOTE — Anesthesia Postprocedure Evaluation (Signed)
Anesthesia Post Note  Patient: Brian Mays  Procedure(s) Performed: Procedure(s) (LRB): RIGHT LOBE LIVER MICROWAVE THERMAL ABLATION (Right)  Anesthesia type: General  Patient location: PACU  Post pain: Pain level controlled  Post assessment: Post-op Vital signs reviewed  Last Vitals: BP 143/73 mmHg  Pulse 72  Temp(Src) 36.3 C (Oral)  Resp 16  Ht 5' 10.5" (1.791 m)  Wt 206 lb (93.441 kg)  BMI 29.13 kg/m2  SpO2 95%  Post vital signs: Reviewed  Level of consciousness: sedated  Complications: No apparent anesthesia complications

## 2015-09-12 NOTE — Procedures (Signed)
Interventional Radiology Procedure Note  Procedure:  CT guided thermal ablation of liver tumor  Anesthesia:  General  Complications:  None  Estimated Blood Loss: < 25 mL  Under [place] and CT guidance, right hepatic tumor localized and ablated with 2 NeuWave PR XT probes at 21 W for 7 minutes. No complications. Plan:  Observation status.  Discharge tonight or tomorrow morning.  Venetia Night. Kathlene Cote, M.D Pager:  [phone]

## 2015-09-12 NOTE — Progress Notes (Signed)
MD gave order for the eye drops, but the pt said his wife was already bringing him some he uses at home that are extra moisturizing for his dry eyes

## 2015-09-13 ENCOUNTER — Other Ambulatory Visit: Payer: Self-pay | Admitting: Radiology

## 2015-09-13 DIAGNOSIS — C7B8 Other secondary neuroendocrine tumors: Principal | ICD-10-CM

## 2015-09-13 DIAGNOSIS — C7A8 Other malignant neuroendocrine tumors: Secondary | ICD-10-CM

## 2015-09-13 NOTE — Discharge Summary (Signed)
Discharge Summary:  History of Present Illness: Brian Mays is a 69 y.o. male with a history of metastatic neuroendocrine carcinoma to liver.  Status post percutaneous thermal ablation of enlarging 3.7 cm right hepatic metastatic lesion today.  Tolerated procedure well with no complaints.    Past Medical History  Diagnosis Date  . GERD (gastroesophageal reflux disease)   . Pancreatitis 1998    chronic  . HTN (hypertension)   . Hemorrhoid   . Hypothyroidism   . Colon polyps   . Metastatic carcinoma ([place]) 2010    Dr Jonette Eva  . Diverticulosis   . Gallstones   . Hiatal hernia   . Heart murmur   . Anemia     Past Surgical History  Procedure Laterality Date  . Cholecystectomy  1999  . Carcinoil resection  01/2009  . Appendectomy  1964    Allergies: Iohexol  Medications: Prior to Admission medications   Medication Sig Start Date End Date Taking? Authorizing Provider  amLODipine (NORVASC) 5 MG tablet TAKE 1 TABLET DAILY 05/30/15  Yes Aleksei Plotnikov V, MD  aspirin EC 81 MG tablet Take 81 mg by mouth every morning.   Yes Historical Provider, MD  Cholecalciferol 1000 UNITS tablet Take 1,000 Units by mouth daily.     Yes Historical Provider, MD  fluticasone (FLONASE) 50 MCG/ACT nasal spray Place 2 sprays into both nostrils daily as needed (sinuses).  01/06/15  Yes Historical Provider, MD  hydrocortisone (ANUSOL-HC) 25 MG suppository INSERT 1 SUPPOSITORY RECTALLY TWICE A DAY AS NEEDED FOR HEMORRHOIDS 02/07/15  Yes [place], NP  Lanreotide Acetate (SOMATULINE DEPOT [place]) Inject 120 mcg into the skin every 28 (twenty-eight) days. Receives at Dr Antonieta Pert office   Yes Historical Provider, MD  lansoprazole (PREVACID) 30 MG capsule Take 1 capsule (30 mg total) by mouth daily at 12 noon. Patient taking differently: Take 30 mg by mouth daily.  03/06/15  Yes Aleksei Plotnikov V, MD  levothyroxine (SYNTHROID, LEVOTHROID) 50 MCG tablet TAKE 1 TABLET DAILY. 07/30/15  Yes Aleksei  Plotnikov V, MD  losartan (COZAAR) 100 MG tablet TAKE 1 TABLET BY MOUTH EVERY MORNING 03/14/15  Yes Volanda Napoleon, MD  Omega-3 Fatty Acids (FISH OIL) 1000 MG CAPS Take 1 capsule by mouth daily.   Yes Historical Provider, MD  Pancrelipase, Lip-Prot-Amyl, (CREON) 24000 UNITS CPEP 1 po tid with food Patient taking differently: Take 1 capsule by mouth 3 (three) times daily with meals.  07/30/15  Yes Aleksei Plotnikov V, MD  PARoxetine (PAXIL) 10 MG tablet Take 1 tablet (10 mg total) by mouth daily. 11/13/14  Yes Volanda Napoleon, MD  sodium chloride (OCEAN) 0.65 % SOLN nasal spray Place 1 spray into both nostrils as needed for congestion.    Yes Historical Provider, MD  vitamin B-12 (CYANOCOBALAMIN) 1000 MCG tablet Take 1,000 mcg by mouth daily.   Yes Historical Provider, MD  metoCLOPramide (REGLAN) 10 MG tablet Take 10 mg by mouth daily.    Historical Provider, MD     Family History  Problem Relation Age of Onset  . Kidney disease Mother   . Hyperlipidemia Mother   . Hypertension Mother   . COPD Father   . Ulcerative colitis Daughter     Social History   Social History  . Marital Status: Married    Spouse Name: N/A  . Number of Children: 1  . Years of Education: N/A   Occupational History  . Sales    Social History Main  Topics  . Smoking status: Former Smoker -- 1.50 packs/day for 35 years    Types: Cigarettes    Start date: 09/25/1959    Quit date: 11/01/1997  . Smokeless tobacco: Never Used     Comment: quit smoking 18 years ago  . Alcohol Use: 0.0 oz/week    0 Standard drinks or equivalent per week     Comment: 4 beers weekly   . Drug Use: No  . Sexual Activity: Yes   Other Topics Concern  . None   Social History Narrative   Regular Exercise -  NO    Review of Systems: A 12 point ROS discussed and pertinent positives are indicated in the HPI above.  All other systems are negative.  Review of Systems  Vital Signs: BP 130/71 mmHg  Pulse 71  Temp(Src) 97.8 F  (36.6 C) (Oral)  Resp 18  Ht 5' 10.5" (1.791 m)  Wt 206 lb (93.441 kg)  BMI 29.13 kg/m2  SpO2 98%  Physical Exam  Imaging: Ct Guide Tissue Ablation  09/12/2015  CLINICAL DATA:  History of metastatic neuroendocrine carcinoma to the liver and status post prior radioembolization treatment of the liver with you yttrium 90. Recent imaging has demonstrated an enlarging right hepatic metastasis which is amenable to percutaneous thermal ablation based on size and location. EXAM: CT-GUIDED PERCUTANEOUS THERMAL ABLATION OF LIVER TUMOR COMPARISON:  CT of the abdomen and pelvis on 07/14/2015 ANESTHESIA/SEDATION: Anesthesia:  General Medications:  3.375 g IV Zosyn. CONTRAST:  None. PROCEDURE: The procedure, risks, benefits, and alternatives were explained to the patient. Questions regarding the procedure were encouraged and answered. The patient understands and consents to the procedure. A time-out was performed prior to the procedure. The patient was placed under general anesthesia. Initial unenhanced CT was performed in a supine and oblique position to localize the liver. The right abdominal wall was prepped with Betadine in a sterile fashion, and a sterile drape was applied covering the operative field. A sterile gown and sterile gloves were used for the procedure. Under CT and all guidance, 2 separate NeuWave Precision PR XT probes were placed into the right lobe of the liver. Probe positioning was confirmed by CT prior to ablation. Ablation was performed through the 2 probes simultaneously at 96 W for 7 minutes. Monitoring of ablation was performed under real-time ultrasound guidance. CT was also performed immediately following the 7 minutes ablation cycle. Tract cautery was performed as the probes were removed. COMPLICATIONS: None FINDINGS: The lesion within the lateral aspect of the right lobe of the liver was well visualized by ultrasound, allowing probe placement. The lesion measures approximately 2.4 x 3.8  cm. CT additionally confirmed probe death prior to thermal ablation. There was good distribution of gas in the lesion by ultrasound and CT during thermal ablation. IMPRESSION: CT guided percutaneous thermal ablation of enlarging right lobe liver metastasis. The patient will be observed overnight. Initial follow-up will be performed in approximately 4 weeks. Electronically Signed   By: Aletta Edouard M.D.   On: 09/12/2015 14:52    Labs:  CBC:  Recent Labs  06/11/15 1417 07/14/15 1114 09/03/15 1319 09/05/15 1050  WBC 6.6 8.8 7.0 7.0  HGB 13.4 13.4 13.4 13.0  HCT 40.3 41.1 41.1 40.3  PLT 186 169 175 171    COAGS:  Recent Labs  09/05/15 1050  INR 1.00  APTT 28    BMP:  Recent Labs  03/07/15 0824  06/11/15 1417 07/14/15 1114 09/03/15 1319 09/05/15 1050  NA  141  < > 139 140 142 139  K 4.3  < > 4.1 4.1 3.8 4.4  CL 103  --  104 100  --  105  CO2 31  < > 27 28 30* 26  GLUCOSE 98  < > 117* 131* 104 100*  BUN 13  < > 17 14 13.2 14  CALCIUM 9.3  < > 9.3 8.7 9.7 9.0  CREATININE 0.86  < > 0.84 1.0 0.9 0.74  GFRNONAA  --   --   --   --   --  >60  GFRAA  --   --   --   --   --  >60  < > = values in this interval not displayed.  LIVER FUNCTION TESTS:  Recent Labs  06/11/15 1417 07/14/15 1114 09/03/15 1319 09/05/15 1050  BILITOT 0.6 0.80 0.63 0.8  AST 24 32 24 28  ALT 14 21 15  14*  ALKPHOS 54 57 66 56  PROT 7.2 8.0 8.0 7.8  ALBUMIN 3.6 3.3 3.5 3.5    TUMOR MARKERS:  Recent Labs  04/11/15 0956 06/11/15 1417 07/14/15 1115 09/03/15 1319  CHROMGRNA 34* 96* 17* 48*    Assessment and Plan:  OK for discharge.  2 month follow up in clinic with CT of abdomen with contrast.  Signed: Aldin Drees T 09/13/2015, 10:02 AM

## 2015-09-15 ENCOUNTER — Encounter: Payer: Self-pay | Admitting: Internal Medicine

## 2015-10-06 ENCOUNTER — Encounter: Payer: Self-pay | Admitting: Internal Medicine

## 2015-10-13 ENCOUNTER — Ambulatory Visit (HOSPITAL_BASED_OUTPATIENT_CLINIC_OR_DEPARTMENT_OTHER): Payer: Medicare PPO | Admitting: Hematology & Oncology

## 2015-10-13 ENCOUNTER — Other Ambulatory Visit (HOSPITAL_BASED_OUTPATIENT_CLINIC_OR_DEPARTMENT_OTHER): Payer: Medicare PPO

## 2015-10-13 ENCOUNTER — Encounter: Payer: Self-pay | Admitting: Hematology & Oncology

## 2015-10-13 ENCOUNTER — Ambulatory Visit (HOSPITAL_BASED_OUTPATIENT_CLINIC_OR_DEPARTMENT_OTHER): Payer: Medicare PPO

## 2015-10-13 VITALS — BP 147/85 | HR 66 | Temp 98.5°F | Resp 16 | Ht 70.0 in | Wt 206.0 lb

## 2015-10-13 DIAGNOSIS — C7B8 Other secondary neuroendocrine tumors: Secondary | ICD-10-CM

## 2015-10-13 DIAGNOSIS — E340 Carcinoid syndrome: Secondary | ICD-10-CM | POA: Diagnosis not present

## 2015-10-13 DIAGNOSIS — D499 Neoplasm of unspecified behavior of unspecified site: Secondary | ICD-10-CM

## 2015-10-13 LAB — CBC WITH DIFFERENTIAL (CANCER CENTER ONLY)
BASO#: 0 10*3/uL (ref 0.0–0.2)
BASO%: 0.6 % (ref 0.0–2.0)
EOS%: 5.2 % (ref 0.0–7.0)
Eosinophils Absolute: 0.3 10*3/uL (ref 0.0–0.5)
HCT: 39.3 % (ref 38.7–49.9)
HGB: 12.8 g/dL — ABNORMAL LOW (ref 13.0–17.1)
LYMPH#: 1.2 10*3/uL (ref 0.9–3.3)
LYMPH%: 19.7 % (ref 14.0–48.0)
MCH: 27.9 pg — ABNORMAL LOW (ref 28.0–33.4)
MCHC: 32.6 g/dL (ref 32.0–35.9)
MCV: 86 fL (ref 82–98)
MONO#: 0.6 10*3/uL (ref 0.1–0.9)
MONO%: 10 % (ref 0.0–13.0)
NEUT#: 4 10*3/uL (ref 1.5–6.5)
NEUT%: 64.5 % (ref 40.0–80.0)
Platelets: 142 10*3/uL — ABNORMAL LOW (ref 145–400)
RBC: 4.59 10*6/uL (ref 4.20–5.70)
RDW: 14.9 % (ref 11.1–15.7)
WBC: 6.2 10*3/uL (ref 4.0–10.0)

## 2015-10-13 LAB — CMP (CANCER CENTER ONLY)
ALBUMIN: 3.1 g/dL — AB (ref 3.3–5.5)
ALT(SGPT): 20 U/L (ref 10–47)
AST: 33 U/L (ref 11–38)
Alkaline Phosphatase: 61 U/L (ref 26–84)
BUN, Bld: 11 mg/dL (ref 7–22)
CALCIUM: 9.3 mg/dL (ref 8.0–10.3)
CHLORIDE: 101 meq/L (ref 98–108)
CO2: 29 meq/L (ref 18–33)
Creat: 1 mg/dl (ref 0.6–1.2)
GLUCOSE: 97 mg/dL (ref 73–118)
POTASSIUM: 4.2 meq/L (ref 3.3–4.7)
Sodium: 133 mEq/L (ref 128–145)
Total Bilirubin: 0.7 mg/dl (ref 0.20–1.60)
Total Protein: 7.6 g/dL (ref 6.4–8.1)

## 2015-10-13 MED ORDER — LANREOTIDE ACETATE 120 MG/0.5ML ~~LOC~~ SOLN
120.0000 mg | Freq: Once | SUBCUTANEOUS | Status: AC
  Administered 2015-10-13: 120 mg via SUBCUTANEOUS
  Filled 2015-10-13: qty 120

## 2015-10-13 NOTE — Progress Notes (Signed)
Hematology and Oncology Follow Up Visit  Brian Mays [id number] 1946-06-23 69 y.o. 10/13/2015   Principle Diagnosis:  Metastatic low grade neuroendocrine tumor-hepatic metastases   Current Therapy:    S/p third yttrium-90 intrahepatic therapy-November 2016  Somatuline 120mg  sq q month     Interim History:  Brian Mays is back for followup.he looks good. He had a third intrahepatic procedure on November 11. He tolerated this pretty well. He still a little sore.  Otherwise, he is doing quite well. He is still working. He is looking for to Christmas.  His last chromogranin A level was 48. This is before his procedure.  He's had no change in bowel or bladder habits. He's had no diarrhea. He's had no wheezing. He's had no cough. He's had no leg swelling. He's had no joint issues.  Overall, his performance status is ECOG 1.   Medications:  Current outpatient prescriptions:  .  amLODipine (NORVASC) 5 MG tablet, TAKE 1 TABLET DAILY, Disp: 30 tablet, Rfl: 11 .  aspirin EC 81 MG tablet, Take 81 mg by mouth every morning., Disp: , Rfl:  .  Cholecalciferol 1000 UNITS tablet, Take 1,000 Units by mouth daily.  , Disp: , Rfl:  .  fluticasone (FLONASE) 50 MCG/ACT nasal spray, Place 2 sprays into both nostrils daily as needed (sinuses). , Disp: , Rfl: 11 .  hydrocortisone (ANUSOL-HC) 25 MG suppository, INSERT 1 SUPPOSITORY RECTALLY TWICE A DAY AS NEEDED FOR HEMORRHOIDS, Disp: 30 suppository, Rfl: 0 .  Lanreotide Acetate (SOMATULINE DEPOT [place]), Inject 120 mcg into the skin every 28 (twenty-eight) days. Receives at Dr Antonieta Pert office, Disp: , Rfl:  .  lansoprazole (PREVACID) 30 MG capsule, Take 1 capsule (30 mg total) by mouth daily at 12 noon. (Patient taking differently: Take 30 mg by mouth daily. ), Disp: 30 capsule, Rfl: 11 .  levothyroxine (SYNTHROID, LEVOTHROID) 50 MCG tablet, TAKE 1 TABLET DAILY., Disp: 90 tablet, Rfl: 2 .  losartan (COZAAR) 100 MG tablet, TAKE 1 TABLET BY MOUTH EVERY  MORNING, Disp: 90 tablet, Rfl: 3 .  metoCLOPramide (REGLAN) 10 MG tablet, Take 10 mg by mouth daily., Disp: , Rfl:  .  Omega-3 Fatty Acids (FISH OIL) 1000 MG CAPS, Take 1 capsule by mouth daily., Disp: , Rfl:  .  Pancrelipase, Lip-Prot-Amyl, (CREON) 24000 UNITS CPEP, 1 po tid with food (Patient taking differently: Take 1 capsule by mouth 3 (three) times daily with meals. ), Disp: 270 capsule, Rfl: 3 .  PARoxetine (PAXIL) 10 MG tablet, Take 1 tablet (10 mg total) by mouth daily., Disp: 30 tablet, Rfl: 3 .  sodium chloride (OCEAN) 0.65 % SOLN nasal spray, Place 1 spray into both nostrils as needed for congestion. , Disp: , Rfl:  .  vitamin B-12 (CYANOCOBALAMIN) 1000 MCG tablet, Take 1,000 mcg by mouth daily., Disp: , Rfl:   Allergies:  Allergies  Allergen Reactions  . Iohexol      Code: HIVES, Desc: PER [name] @ [facility], PT IS ALLERGIC TO CONTRAST DYE 10/02/08/RM  05/01/10...needs full premeds per our protocol w/ [facility] imaging., Onset Date: [id number]     Past Medical History, Surgical history, Social history, and Family History were reviewed and updated.  Review of Systems: As above  Physical Exam:  height is 5\' 10"  (1.778 m) and weight is 206 lb (93.441 kg). His oral temperature is 98.5 F (36.9 C). His blood pressure is 147/85 and his pulse is 66. His respiration is 16.   Well-developed and well-nourished white gentleman.  Head and exam shows no ocular or oral lesions. There are no palpable cervical or supraclavicular lymph nodes. Thyroid is not palpable. Lungs are clear. Cardiac exam is regular rate and rhythm with a normal S1 and S2. He has no murmurs, rubs or bruits.. Abdomen is soft. He has a well-healed laparotomy scar. There is no fluid wave. There is no guarding. He is no palpable liver or spleen tip. Back exam shows no tenderness over the spine, ribs or hips. Extremities shows no clubbing, cyanosis or edema. Skin exam no rashes. Neurological exam is nonfocal. Lab Results  Component  Value Date   WBC 6.2 10/13/2015   HGB 12.8* 10/13/2015   HCT 39.3 10/13/2015   MCV 86 10/13/2015   PLT 142* 10/13/2015     Chemistry      Component Value Date/Time   NA 133 10/13/2015 1451   NA 139 09/05/2015 1050   NA 142 09/03/2015 1319   K 4.2 10/13/2015 1451   K 4.4 09/05/2015 1050   K 3.8 09/03/2015 1319   CL 101 10/13/2015 1451   CL 105 09/05/2015 1050   CO2 29 10/13/2015 1451   CO2 26 09/05/2015 1050   CO2 30* 09/03/2015 1319   BUN 11 10/13/2015 1451   BUN 14 09/05/2015 1050   BUN 13.2 09/03/2015 1319   CREATININE 1.0 10/13/2015 1451   CREATININE 0.74 09/05/2015 1050   CREATININE 0.9 09/03/2015 1319      Component Value Date/Time   CALCIUM 9.3 10/13/2015 1451   CALCIUM 9.0 09/05/2015 1050   CALCIUM 9.7 09/03/2015 1319   ALKPHOS 61 10/13/2015 1451   ALKPHOS 56 09/05/2015 1050   ALKPHOS 66 09/03/2015 1319   AST 33 10/13/2015 1451   AST 28 09/05/2015 1050   AST 24 09/03/2015 1319   ALT 20 10/13/2015 1451   ALT 14* 09/05/2015 1050   ALT 15 09/03/2015 1319   BILITOT 0.70 10/13/2015 1451   BILITOT 0.8 09/05/2015 1050   BILITOT 0.63 09/03/2015 1319         Impression and Plan: Brian Mays is 69 year old gentleman with a neuroendocrine carcinoma. He has liver metastases. He had a third intrahepatic therapy with yttrium-90 back in November 2016. This is for an enlarging left hepatic lobe lesion.  I probably would not get a scan on him for another couple months area did we will see what his chromogranin A level is.  He is still a little sore in the right upper quadrant of his abdomen.  I will plan to see him back in one month.   Volanda Napoleon, MD 12/12/20165:53 PM

## 2015-10-13 NOTE — Patient Instructions (Signed)
Lanreotide injection What is this medicine? LANREOTIDE (lan REE oh tide) is used to reduce blood levels of growth hormone in patients with a condition called acromegaly. It also works to slow or stop tumor growth in patients with gastroenteropancreatic neuroendocrine tumor (GEP-NET). This medicine may be used for other purposes; ask your health care provider or pharmacist if you have questions. What should I tell my health care provider before I take this medicine? They need to know if you have any of these conditions: -diabetes -gallbladder disease -heart disease -kidney disease -liver disease -an unusual or allergic reaction to lanreotide, other medicines, latex, foods, dyes, or preservatives -pregnant or trying to get pregnant -breast-feeding How should I use this medicine? This medicine is for injection under the skin. It is given by a health care professional in a hospital or clinic setting. Contact your pediatrician or health care professional regarding the use of this medicine in children. Special care may be needed. Overdosage: If you think you have taken too much of this medicine contact a poison control center or emergency room at once. NOTE: This medicine is only for you. Do not share this medicine with others. What if I miss a dose? It is important not to miss your dose. Call your doctor or health care professional if you are unable to keep an appointment. What may interact with this medicine? -bromocriptine -cyclosporine -medicines for diabetes, including insulin -medicines for heart disease or hypertension -quinidine This list may not describe all possible interactions. Give your health care provider a list of all the medicines, herbs, non-prescription drugs, or dietary supplements you use. Also tell them if you smoke, drink alcohol, or use illegal drugs. Some items may interact with your medicine. What should I watch for while using this medicine? Visit your doctor or  health care professional for regular checks on your progress. Your condition will be monitored carefully while you are receiving this medicine. This medicine may cause increases or decreases in blood sugar. Signs of high blood sugar include frequent urination, unusual thirst, flushed or dry skin, difficulty breathing, drowsiness, stomach ache, nausea, vomiting or dry mouth. Signs of low blood sugar include chills, cool, pale skin or cold sweats, drowsiness, extreme hunger, fast heartbeat, headache, nausea, nervousness or anxiety, shakiness, trembling, unsteadiness, tiredness, or weakness. Contact your doctor or health care professional right away if you experience any of these symptoms. What side effects may I notice from receiving this medicine? Side effects that you should report to your doctor or health care professional as soon as possible: -allergic reactions like skin rash, itching or hives, swelling of the face, lips, or tongue -changes in blood sugar -changes in heart rate -severe stomach pain Side effects that usually do not require medical attention (report to your doctor or health care professional if they continue or are bothersome): -diarrhea or constipation -gas or stomach pain -nausea, vomiting -pain, redness, swelling and irritation at site where injected This list may not describe all possible side effects. Call your doctor for medical advice about side effects. You may report side effects to FDA at 1-800-FDA-1088. Where should I keep my medicine? This drug is given in a hospital or clinic and will not be stored at home. NOTE: This sheet is a summary. It may not cover all possible information. If you have questions about this medicine, talk to your doctor, pharmacist, or health care provider.    2016, Elsevier/Gold Standard. (2013-10-17 17:43:04)  

## 2015-10-15 ENCOUNTER — Other Ambulatory Visit: Payer: Self-pay | Admitting: *Deleted

## 2015-10-15 DIAGNOSIS — D499 Neoplasm of unspecified behavior of unspecified site: Secondary | ICD-10-CM

## 2015-10-15 MED ORDER — PAROXETINE HCL 10 MG PO TABS: 10.0000 mg | ORAL_TABLET | Freq: Every day | ORAL | Status: DC

## 2015-10-16 ENCOUNTER — Telehealth: Payer: Self-pay | Admitting: Hematology & Oncology

## 2015-10-16 LAB — CHROMOGRANIN A: CHROMOGRANIN A: 30 ng/mL — AB (ref <=15)

## 2015-10-16 NOTE — Telephone Encounter (Signed)
[place]: [id number] Status: Approved  Dates: 11/17/2015 - 02/15/2016 CPT: [id number]

## 2015-11-17 ENCOUNTER — Other Ambulatory Visit (HOSPITAL_BASED_OUTPATIENT_CLINIC_OR_DEPARTMENT_OTHER): Payer: Medicare PPO

## 2015-11-17 ENCOUNTER — Ambulatory Visit (HOSPITAL_BASED_OUTPATIENT_CLINIC_OR_DEPARTMENT_OTHER): Payer: Medicare PPO

## 2015-11-17 ENCOUNTER — Encounter: Payer: Self-pay | Admitting: Family

## 2015-11-17 ENCOUNTER — Ambulatory Visit (HOSPITAL_BASED_OUTPATIENT_CLINIC_OR_DEPARTMENT_OTHER): Payer: Medicare PPO | Admitting: Family

## 2015-11-17 VITALS — BP 147/79 | HR 66 | Temp 98.1°F | Resp 16 | Ht 70.0 in | Wt 208.0 lb

## 2015-11-17 DIAGNOSIS — C7A8 Other malignant neuroendocrine tumors: Secondary | ICD-10-CM

## 2015-11-17 DIAGNOSIS — E340 Carcinoid syndrome: Secondary | ICD-10-CM | POA: Diagnosis not present

## 2015-11-17 DIAGNOSIS — C7B8 Other secondary neuroendocrine tumors: Secondary | ICD-10-CM

## 2015-11-17 DIAGNOSIS — D499 Neoplasm of unspecified behavior of unspecified site: Secondary | ICD-10-CM

## 2015-11-17 LAB — CBC WITH DIFFERENTIAL (CANCER CENTER ONLY)
BASO#: 0 10*3/uL (ref 0.0–0.2)
BASO%: 0.6 % (ref 0.0–2.0)
EOS ABS: 0.3 10*3/uL (ref 0.0–0.5)
EOS%: 4.9 % (ref 0.0–7.0)
HEMATOCRIT: 41.4 % (ref 38.7–49.9)
HGB: 13.3 g/dL (ref 13.0–17.1)
LYMPH#: 1.5 10*3/uL (ref 0.9–3.3)
LYMPH%: 24.4 % (ref 14.0–48.0)
MCH: 27.9 pg — AB (ref 28.0–33.4)
MCHC: 32.1 g/dL (ref 32.0–35.9)
MCV: 87 fL (ref 82–98)
MONO#: 0.6 10*3/uL (ref 0.1–0.9)
MONO%: 9 % (ref 0.0–13.0)
NEUT#: 3.8 10*3/uL (ref 1.5–6.5)
NEUT%: 61.1 % (ref 40.0–80.0)
PLATELETS: 161 10*3/uL (ref 145–400)
RBC: 4.77 10*6/uL (ref 4.20–5.70)
RDW: 15.1 % (ref 11.1–15.7)
WBC: 6.3 10*3/uL (ref 4.0–10.0)

## 2015-11-17 LAB — CMP (CANCER CENTER ONLY)
ALBUMIN: 3.2 g/dL — AB (ref 3.3–5.5)
ALT(SGPT): 16 U/L (ref 10–47)
AST: 27 U/L (ref 11–38)
Alkaline Phosphatase: 53 U/L (ref 26–84)
BILIRUBIN TOTAL: 0.7 mg/dL (ref 0.20–1.60)
BUN, Bld: 15 mg/dL (ref 7–22)
CALCIUM: 9.4 mg/dL (ref 8.0–10.3)
CHLORIDE: 102 meq/L (ref 98–108)
CO2: 30 meq/L (ref 18–33)
Creat: 0.9 mg/dl (ref 0.6–1.2)
GLUCOSE: 107 mg/dL (ref 73–118)
POTASSIUM: 4.7 meq/L (ref 3.3–4.7)
Sodium: 146 mEq/L — ABNORMAL HIGH (ref 128–145)
Total Protein: 8.4 g/dL — ABNORMAL HIGH (ref 6.4–8.1)

## 2015-11-17 LAB — LACTATE DEHYDROGENASE: LDH: 196 U/L (ref 125–245)

## 2015-11-17 MED ORDER — LANREOTIDE ACETATE 120 MG/0.5ML ~~LOC~~ SOLN
120.0000 mg | Freq: Once | SUBCUTANEOUS | Status: AC
  Administered 2015-11-17: 120 mg via SUBCUTANEOUS
  Filled 2015-11-17: qty 120

## 2015-11-17 NOTE — Progress Notes (Signed)
Hematology and Oncology Follow Up Visit  AXL JAROSZ [id number] 28-Dec-1945 70 y.o. 11/17/2015   Principle Diagnosis:  Metastatic low grade neuroendocrine tumor-hepatic metastases  Current Therapy:    S/p third yttrium-90 intrahepatic therapy - November 2016  Sandostatin LAR 30 mg IM every month    Interim History:  Mr. Tilbury is here today for a follow-up. He is still doing well and has no complaints at this time.   His last Chromogranin A was 30 in December. He denies fatigue. No fever, chills, n/v, cough, rash, headache, dizziness, SOB, chest pain, palpitations, abdominal pain, constipation, diarrhea, blood in urine or stool.  No swelling, tenderness, numbness or tingling in his extremities. No c/o "bony" or joint aches and pains.   He continues to maintain a healthy appetite and is staying hydrated. His weight is unchanged. He is staying active walking.   Medications:    Medication List       This list is accurate as of: 11/17/15  9:44 PM.  Always use your most recent med list.               amLODipine 5 MG tablet  Commonly known as:  NORVASC  TAKE 1 TABLET DAILY     aspirin EC 81 MG tablet  Take 81 mg by mouth every morning.     Cholecalciferol 1000 units tablet  Take 1,000 Units by mouth daily.     Fish Oil 1000 MG Caps  Take 1 capsule by mouth daily.     fluticasone 50 MCG/ACT nasal spray  Commonly known as:  FLONASE  Place 2 sprays into both nostrils daily as needed (sinuses).     hydrocortisone 25 MG suppository  Commonly known as:  ANUSOL-HC  INSERT 1 SUPPOSITORY RECTALLY TWICE A DAY AS NEEDED FOR HEMORRHOIDS     lansoprazole 30 MG capsule  Commonly known as:  PREVACID  Take 1 capsule (30 mg total) by mouth daily at 12 noon.     levothyroxine 50 MCG tablet  Commonly known as:  SYNTHROID, LEVOTHROID  TAKE 1 TABLET DAILY.     losartan 100 MG tablet  Commonly known as:  COZAAR  TAKE 1 TABLET BY MOUTH EVERY MORNING     metoCLOPramide 10 MG tablet   Commonly known as:  REGLAN  Take 10 mg by mouth daily.     Pancrelipase (Lip-Prot-Amyl) 24000 units Cpep  Commonly known as:  CREON  1 po tid with food     PARoxetine 10 MG tablet  Commonly known as:  PAXIL  Take 1 tablet (10 mg total) by mouth daily.     sodium chloride 0.65 % Soln nasal spray  Commonly known as:  OCEAN  Place 1 spray into both nostrils as needed for congestion.     SOMATULINE DEPOT [place]  Inject 120 mcg into the skin every 28 (twenty-eight) days. Receives at Dr Antonieta Pert office     vitamin B-12 1000 MCG tablet  Commonly known as:  CYANOCOBALAMIN  Take 1,000 mcg by mouth daily.        Allergies:  Allergies  Allergen Reactions  . Iohexol      Code: HIVES, Desc: PER [name] @ [facility], PT IS ALLERGIC TO CONTRAST DYE 10/02/08/RM  05/01/10...needs full premeds per our protocol w/ [facility]., Onset Date: [id number]     Past Medical History, Surgical history, Social history, and Family History were reviewed and updated.  Review of Systems: All other 10 point review of systems is negative.  Physical Exam:  height is 5\' 10"  (1.778 m) and weight is 208 lb (94.348 kg). His oral temperature is 98.1 F (36.7 C). His blood pressure is 147/79 and his pulse is 66. His respiration is 16.   Wt Readings from Last 3 Encounters:  11/17/15 208 lb (94.348 kg)  10/13/15 206 lb (93.441 kg)  09/12/15 206 lb (93.441 kg)    Ocular: Sclerae unicteric, pupils equal, round and reactive to light Ear-nose-throat: Oropharynx clear, dentition fair Lymphatic: No cervical supraclavicular or axillary adenopathy Lungs no rales or rhonchi, good excursion bilaterally Heart regular rate and rhythm, no murmur appreciated Abd soft, nontender, positive bowel sounds, no spleen or liver tip palpated on exam MSK no focal spinal tenderness, no joint edema Neuro: non-focal, well-oriented, appropriate affect Breasts: Deferred  Lab Results  Component Value Date   WBC 6.3 11/17/2015   HGB  13.3 11/17/2015   HCT 41.4 11/17/2015   MCV 87 11/17/2015   PLT 161 11/17/2015   No results found for: FERRITIN, IRON, TIBC, UIBC, IRONPCTSAT Lab Results  Component Value Date   RBC 4.77 11/17/2015   No results found for: KPAFRELGTCHN, LAMBDASER, KAPLAMBRATIO No results found for: IGGSERUM, IGA, IGMSERUM No results found for: Odetta Pink, SPEI   Chemistry      Component Value Date/Time   NA 146* 11/17/2015 1322   NA 139 09/05/2015 1050   NA 142 09/03/2015 1319   K 4.7 11/17/2015 1322   K 4.4 09/05/2015 1050   K 3.8 09/03/2015 1319   CL 102 11/17/2015 1322   CL 105 09/05/2015 1050   CO2 30 11/17/2015 1322   CO2 26 09/05/2015 1050   CO2 30* 09/03/2015 1319   BUN 15 11/17/2015 1322   BUN 14 09/05/2015 1050   BUN 13.2 09/03/2015 1319   CREATININE 0.9 11/17/2015 1322   CREATININE 0.74 09/05/2015 1050   CREATININE 0.9 09/03/2015 1319      Component Value Date/Time   CALCIUM 9.4 11/17/2015 1322   CALCIUM 9.0 09/05/2015 1050   CALCIUM 9.7 09/03/2015 1319   ALKPHOS 53 11/17/2015 1322   ALKPHOS 56 09/05/2015 1050   ALKPHOS 66 09/03/2015 1319   AST 27 11/17/2015 1322   AST 28 09/05/2015 1050   AST 24 09/03/2015 1319   ALT 16 11/17/2015 1322   ALT 14* 09/05/2015 1050   ALT 15 09/03/2015 1319   BILITOT 0.70 11/17/2015 1322   BILITOT 0.8 09/05/2015 1050   BILITOT 0.63 09/03/2015 1319     Impression and Plan: Mr. Santisteban is 70 year old gentleman with a neuroendocrine carcinoma and liver metastases. He had a third intrahepatic therapy with yttrium-90 back in November 2016. He continues to do well and is asymptomatic at this time.  Chromogranin A in December was 30. Results for today's level are pending.  He will get his Somatuline injection today as planned.  We will plan to see him back in 1 month for labs and follow-up and repeat scans at that time. He knows to contact us with any questions or concerns. We can certainly see  him sooner if need be.   Eliezer Bottom, NP 1/16/20179:44 PM

## 2015-11-17 NOTE — Patient Instructions (Signed)
Lanreotide injection What is this medicine? LANREOTIDE (lan REE oh tide) is used to reduce blood levels of growth hormone in patients with a condition called acromegaly. It also works to slow or stop tumor growth in patients with gastroenteropancreatic neuroendocrine tumor (GEP-NET). This medicine may be used for other purposes; ask your health care provider or pharmacist if you have questions. What should I tell my health care provider before I take this medicine? They need to know if you have any of these conditions: -diabetes -gallbladder disease -heart disease -kidney disease -liver disease -an unusual or allergic reaction to lanreotide, other medicines, latex, foods, dyes, or preservatives -pregnant or trying to get pregnant -breast-feeding How should I use this medicine? This medicine is for injection under the skin. It is given by a health care professional in a hospital or clinic setting. Contact your pediatrician or health care professional regarding the use of this medicine in children. Special care may be needed. Overdosage: If you think you have taken too much of this medicine contact a poison control center or emergency room at once. NOTE: This medicine is only for you. Do not share this medicine with others. What if I miss a dose? It is important not to miss your dose. Call your doctor or health care professional if you are unable to keep an appointment. What may interact with this medicine? -bromocriptine -cyclosporine -medicines for diabetes, including insulin -medicines for heart disease or hypertension -quinidine This list may not describe all possible interactions. Give your health care provider a list of all the medicines, herbs, non-prescription drugs, or dietary supplements you use. Also tell them if you smoke, drink alcohol, or use illegal drugs. Some items may interact with your medicine. What should I watch for while using this medicine? Visit your doctor or  health care professional for regular checks on your progress. Your condition will be monitored carefully while you are receiving this medicine. This medicine may cause increases or decreases in blood sugar. Signs of high blood sugar include frequent urination, unusual thirst, flushed or dry skin, difficulty breathing, drowsiness, stomach ache, nausea, vomiting or dry mouth. Signs of low blood sugar include chills, cool, pale skin or cold sweats, drowsiness, extreme hunger, fast heartbeat, headache, nausea, nervousness or anxiety, shakiness, trembling, unsteadiness, tiredness, or weakness. Contact your doctor or health care professional right away if you experience any of these symptoms. What side effects may I notice from receiving this medicine? Side effects that you should report to your doctor or health care professional as soon as possible: -allergic reactions like skin rash, itching or hives, swelling of the face, lips, or tongue -changes in blood sugar -changes in heart rate -severe stomach pain Side effects that usually do not require medical attention (report to your doctor or health care professional if they continue or are bothersome): -diarrhea or constipation -gas or stomach pain -nausea, vomiting -pain, redness, swelling and irritation at site where injected This list may not describe all possible side effects. Call your doctor for medical advice about side effects. You may report side effects to FDA at 1-800-FDA-1088. Where should I keep my medicine? This drug is given in a hospital or clinic and will not be stored at home. NOTE: This sheet is a summary. It may not cover all possible information. If you have questions about this medicine, talk to your doctor, pharmacist, or health care provider.    2016, Elsevier/Gold Standard. (2013-10-17 17:43:04)  

## 2015-11-19 LAB — CHROMOGRANIN A: CHROMOGRAN A: 12 nmol/L — AB (ref 0–5)

## 2015-11-20 LAB — CHROMOGRANIN A (PARALLEL TESTING): CHROMOGRANIN A: 41 ng/mL — AB (ref <=15)

## 2015-11-21 ENCOUNTER — Telehealth: Payer: Self-pay | Admitting: *Deleted

## 2015-11-21 NOTE — Telephone Encounter (Signed)
Low-grade neuroendocrine tumor. He is doing well. Okay for direct colonoscopy. Thanks for checking

## 2015-11-21 NOTE — Telephone Encounter (Signed)
Dr Henrene Pastor: pt is scheduled for recall colon Tuesday 2/7 and PV Tuesday 1/24.  During chart prep; it looks like pt is being treated for metastatic liver cancer.  Please review chart.  Is pt okay for direct recall colon?  Thanks, Juliann Pulse

## 2015-11-21 NOTE — Telephone Encounter (Signed)
noted 

## 2015-11-25 ENCOUNTER — Ambulatory Visit (AMBULATORY_SURGERY_CENTER): Payer: Self-pay

## 2015-11-25 VITALS — Ht 70.5 in | Wt 208.2 lb

## 2015-11-25 DIAGNOSIS — Z86010 Personal history of colonic polyps: Secondary | ICD-10-CM

## 2015-11-25 MED ORDER — NA SULFATE-K SULFATE-MG SULF 17.5-3.13-1.6 GM/177ML PO SOLN
ORAL | Status: DC
Start: 2015-11-25 — End: 2015-12-09

## 2015-11-25 NOTE — Progress Notes (Signed)
Per pt, no allergies to soy or egg products.Pt not taking any weight loss meds or using  O2 at home. 

## 2015-11-27 ENCOUNTER — Other Ambulatory Visit: Payer: Self-pay | Admitting: *Deleted

## 2015-11-27 MED ORDER — LANSOPRAZOLE 30 MG PO CPDR: 30.0000 mg | DELAYED_RELEASE_CAPSULE | Freq: Every day | ORAL | Status: DC

## 2015-12-09 ENCOUNTER — Ambulatory Visit (AMBULATORY_SURGERY_CENTER): Payer: Medicare PPO | Admitting: Internal Medicine

## 2015-12-09 ENCOUNTER — Encounter: Payer: Self-pay | Admitting: Internal Medicine

## 2015-12-09 VITALS — BP 121/72 | HR 59 | Temp 96.9°F | Resp 10 | Ht 70.0 in | Wt 208.0 lb

## 2015-12-09 DIAGNOSIS — K5140 Inflammatory polyps of colon without complications: Secondary | ICD-10-CM | POA: Diagnosis not present

## 2015-12-09 DIAGNOSIS — D120 Benign neoplasm of cecum: Secondary | ICD-10-CM | POA: Diagnosis not present

## 2015-12-09 DIAGNOSIS — Z86010 Personal history of colonic polyps: Secondary | ICD-10-CM | POA: Diagnosis not present

## 2015-12-09 MED ORDER — SODIUM CHLORIDE 0.9 % IV SOLN: 500.0000 mL | INTRAVENOUS | Status: DC

## 2015-12-09 NOTE — Op Note (Signed)
[place]  [address] [place], [address]   COLONOSCOPY PROCEDURE REPORT  PATIENT: Brian Mays, Brian Mays  MR#: [id number] BIRTHDATE: 12-08-1945 , 79  yrs. old GENDER: male ENDOSCOPIST: Eustace Quail, MD REFERRED [id number] Program Recall PROCEDURE DATE:  12/09/2015 PROCEDURE:   Colonoscopy, surveillance and Colonoscopy with snare polypectomy x 1 First Screening Colonoscopy - Avg.  risk and is 50 yrs.  old or older - No.  Prior Negative Screening - Now for repeat screening. N/A  History of Adenoma - Now for follow-up colonoscopy & has been > or = to 3 yrs.  Yes hx of adenoma.  Has been 3 or more years since last colonoscopy.  Polyps removed today? Yes ASA CLASS:   Class III INDICATIONS:Surveillance due to prior colonic neoplasia and PH Colon Adenoma.. Index examination 2006 (HP); November 2011 (small TA ).  MEDICATIONS: Monitored anesthesia care and Propofol 300 mg IV  DESCRIPTION OF PROCEDURE:   After the risks benefits and alternatives of the procedure were thoroughly explained, informed consent was obtained.  The digital rectal exam revealed no abnormalities of the rectum.   The LB [id number]  endoscope was introduced through the anus and advanced to the cecum, which was identified by both the appendix and ileocecal valve. No adverse events experienced.   The quality of the prep was excellent. (Suprep was used)  The instrument was then slowly withdrawn as the colon was fully examined. Estimated blood loss is zero unless otherwise noted in this procedure report.  COLON FINDINGS: A single  hyperemic polyp measuring 5 mm in size was found at the cecum.  A polypectomy was performed with a cold snare. The resection was complete, the polyp tissue was completely retrieved and sent to histology. Moderate post-polypectomy oozing was controlled with snare tip cautery.   There was moderate diverticulosis in the ascending and sigmoid colon.   A   small nonbleeding angiodysplastic lesion was found in the ascending colon.   The examination was otherwise normal.  Retroflexed views revealed internal hemorrhoids.  Time to cecum = 2.5 Withdrawal time = 14.1   The scope was withdrawn and the procedure completed. COMPLICATIONS: There were no immediate complications.  ENDOSCOPIC IMPRESSION: 1.   Single polyp was found at the cecum; polypectomy was performed with a cold snare. Oozing controlled with cautery 2.   Moderate diverticulosis was noted in the ascending and sigmoid colon 3.   Small nonbleeding Angiodysplastic lesion in the ascending colon  4.   The examination was otherwise normal  RECOMMENDATIONS: 1. Repeat colonoscopy in 5 years if polyp adenomatous; otherwise 10 years  eSigned:  Eustace Quail, MD 12/09/2015 11:41 AM   cc: The Patient and Altamese [place]monak.  Plotnikov, MD

## 2015-12-09 NOTE — Progress Notes (Signed)
Called to room to assist during endoscopic procedure.  Patient ID and intended procedure confirmed with present staff. Received instructions for my participation in the procedure from the performing physician.  

## 2015-12-09 NOTE — Progress Notes (Signed)
Patient awakening,vss,report to rn 

## 2015-12-09 NOTE — Patient Instructions (Signed)
YOU HAD AN ENDOSCOPIC PROCEDURE TODAY AT THE [facility]:   Refer to the procedure report that was given to you for any specific questions about what was found during the examination.  If the procedure report does not answer your questions, please call your gastroenterologist to clarify.  If you requested that your care partner not be given the details of your procedure findings, then the procedure report has been included in a sealed envelope for you to review at your convenience later.  YOU SHOULD EXPECT: Some feelings of bloating in the abdomen. Passage of more gas than usual.  Walking can help get rid of the air that was put into your GI tract during the procedure and reduce the bloating. If you had a lower endoscopy (such as a colonoscopy or flexible sigmoidoscopy) you may notice spotting of blood in your stool or on the toilet paper. If you underwent a bowel prep for your procedure, you may not have a normal bowel movement for a few days.  Please Note:  You might notice some irritation and congestion in your nose or some drainage.  This is from the oxygen used during your procedure.  There is no need for concern and it should clear up in a day or so.  SYMPTOMS TO REPORT IMMEDIATELY:   Following lower endoscopy (colonoscopy or flexible sigmoidoscopy):  Excessive amounts of blood in the stool  Significant tenderness or worsening of abdominal pains  Swelling of the abdomen that is new, acute  Fever of 100F or higher   For urgent or emergent issues, a gastroenterologist can be reached at any hour by calling [phone].   DIET: Your first meal following the procedure should be a small meal and then it is ok to progress to your normal diet. Heavy or fried foods are harder to digest and may make you feel nauseous or bloated.  Likewise, meals heavy in dairy and vegetables can increase bloating.  Drink plenty of fluids but you should avoid alcoholic beverages for 24  hours.  ACTIVITY:  You should plan to take it easy for the rest of today and you should NOT DRIVE or use heavy machinery until tomorrow (because of the sedation medicines used during the test).    FOLLOW UP: Our staff will call the number listed on your records the next business day following your procedure to check on you and address any questions or concerns that you may have regarding the information given to you following your procedure. If we do not reach you, we will leave a message.  However, if you are feeling well and you are not experiencing any problems, there is no need to return our call.  We will assume that you have returned to your regular daily activities without incident.  If any biopsies were taken you will be contacted by phone or by letter within the next 1-3 weeks.  Please call us at [phone] if you have not heard about the biopsies in 3 weeks.    SIGNATURES/CONFIDENTIALITY: You and/or your care partner have signed paperwork which will be entered into your electronic medical record.  These signatures attest to the fact that that the information above on your After Visit Summary has been reviewed and is understood.  Full responsibility of the confidentiality of this discharge information lies with you and/or your care-partner.  Polyp/Diverticulosis handout given Repeat Colonoscopy in 5 years 

## 2015-12-10 ENCOUNTER — Telehealth: Payer: Self-pay | Admitting: Emergency Medicine

## 2015-12-10 NOTE — Telephone Encounter (Signed)
  Follow up Call-  Call back number 12/09/2015  Post procedure Call Back phone  # [phone]  Permission to leave phone message Yes     Patient questions:  Do you have a fever, pain , or abdominal swelling? No. Pain Score  0 *  Have you tolerated food without any problems? Yes.    Have you been able to return to your normal activities? Yes.    Do you have any questions about your discharge instructions: Diet   No. Medications  No. Follow up visit  No.  Do you have questions or concerns about your Care? No.  Actions: * If pain score is 4 or above: No action needed, pain <4.

## 2015-12-15 ENCOUNTER — Other Ambulatory Visit: Payer: Self-pay | Admitting: *Deleted

## 2015-12-15 DIAGNOSIS — Z91041 Radiographic dye allergy status: Secondary | ICD-10-CM

## 2015-12-15 DIAGNOSIS — D499 Neoplasm of unspecified behavior of unspecified site: Secondary | ICD-10-CM

## 2015-12-15 MED ORDER — DIPHENHYDRAMINE HCL 50 MG PO TABS: 50.0000 mg | ORAL_TABLET | Freq: Once | ORAL | Status: DC

## 2015-12-15 MED ORDER — PREDNISONE 50 MG PO TABS: ORAL_TABLET | ORAL | Status: DC

## 2015-12-16 ENCOUNTER — Encounter: Payer: Self-pay | Admitting: Internal Medicine

## 2015-12-19 ENCOUNTER — Other Ambulatory Visit (HOSPITAL_BASED_OUTPATIENT_CLINIC_OR_DEPARTMENT_OTHER): Payer: Medicare PPO

## 2015-12-19 ENCOUNTER — Ambulatory Visit (HOSPITAL_BASED_OUTPATIENT_CLINIC_OR_DEPARTMENT_OTHER)
Admission: RE | Admit: 2015-12-19 | Discharge: 2015-12-19 | Disposition: A | Discharge status: Home / Self Care | Payer: Medicare PPO | Source: Ambulatory Visit | Attending: Family | Admitting: Family

## 2015-12-19 ENCOUNTER — Ambulatory Visit (HOSPITAL_BASED_OUTPATIENT_CLINIC_OR_DEPARTMENT_OTHER): Payer: Medicare PPO

## 2015-12-19 ENCOUNTER — Ambulatory Visit (HOSPITAL_BASED_OUTPATIENT_CLINIC_OR_DEPARTMENT_OTHER): Payer: Medicare PPO | Admitting: Hematology & Oncology

## 2015-12-19 ENCOUNTER — Encounter: Payer: Self-pay | Admitting: Hematology & Oncology

## 2015-12-19 VITALS — BP 160/87 | HR 81 | Temp 97.7°F | Resp 16 | Ht 70.0 in | Wt 206.0 lb

## 2015-12-19 DIAGNOSIS — C787 Secondary malignant neoplasm of liver and intrahepatic bile duct: Secondary | ICD-10-CM | POA: Diagnosis not present

## 2015-12-19 DIAGNOSIS — C7B8 Other secondary neuroendocrine tumors: Secondary | ICD-10-CM | POA: Insufficient documentation

## 2015-12-19 DIAGNOSIS — D499 Neoplasm of unspecified behavior of unspecified site: Secondary | ICD-10-CM

## 2015-12-19 DIAGNOSIS — C7A8 Other malignant neuroendocrine tumors: Secondary | ICD-10-CM

## 2015-12-19 DIAGNOSIS — I700 Atherosclerosis of aorta: Secondary | ICD-10-CM | POA: Insufficient documentation

## 2015-12-19 DIAGNOSIS — E340 Carcinoid syndrome: Secondary | ICD-10-CM

## 2015-12-19 DIAGNOSIS — N400 Enlarged prostate without lower urinary tract symptoms: Secondary | ICD-10-CM | POA: Diagnosis not present

## 2015-12-19 DIAGNOSIS — K4020 Bilateral inguinal hernia, without obstruction or gangrene, not specified as recurrent: Secondary | ICD-10-CM | POA: Diagnosis not present

## 2015-12-19 LAB — COMPREHENSIVE METABOLIC PANEL
ALK PHOS: 72 U/L (ref 40–150)
ALT: 18 U/L (ref 0–55)
AST: 27 U/L (ref 5–34)
Albumin: 3.6 g/dL (ref 3.5–5.0)
Anion Gap: 9 mEq/L (ref 3–11)
BUN: 16.1 mg/dL (ref 7.0–26.0)
CHLORIDE: 103 meq/L (ref 98–109)
CO2: 26 meq/L (ref 22–29)
Calcium: 9.5 mg/dL (ref 8.4–10.4)
Creatinine: 0.9 mg/dL (ref 0.7–1.3)
EGFR: 87 mL/min/{1.73_m2} — AB (ref >90)
GLUCOSE: 123 mg/dL (ref 70–140)
POTASSIUM: 4.3 meq/L (ref 3.5–5.1)
SODIUM: 138 meq/L (ref 136–145)
Total Bilirubin: 0.81 mg/dL (ref 0.20–1.20)
Total Protein: 8.7 g/dL — ABNORMAL HIGH (ref 6.4–8.3)

## 2015-12-19 LAB — LACTATE DEHYDROGENASE: LDH: 193 U/L (ref 125–245)

## 2015-12-19 LAB — CBC WITH DIFFERENTIAL (CANCER CENTER ONLY)
BASO#: 0 10*3/uL (ref 0.0–0.2)
BASO%: 0.1 % (ref 0.0–2.0)
EOS ABS: 0 10*3/uL (ref 0.0–0.5)
EOS%: 0 % (ref 0.0–7.0)
HCT: 42 % (ref 38.7–49.9)
HGB: 13.5 g/dL (ref 13.0–17.1)
LYMPH#: 0.8 10*3/uL — ABNORMAL LOW (ref 0.9–3.3)
LYMPH%: 9 % — AB (ref 14.0–48.0)
MCH: 27.3 pg — AB (ref 28.0–33.4)
MCHC: 32.1 g/dL (ref 32.0–35.9)
MCV: 85 fL (ref 82–98)
MONO#: 0.1 10*3/uL (ref 0.1–0.9)
MONO%: 1 % (ref 0.0–13.0)
NEUT%: 89.9 % — ABNORMAL HIGH (ref 40.0–80.0)
NEUTROS ABS: 8.3 10*3/uL — AB (ref 1.5–6.5)
PLATELETS: 184 10*3/uL (ref 145–400)
RBC: 4.95 10*6/uL (ref 4.20–5.70)
RDW: 14.9 % (ref 11.1–15.7)
WBC: 9.2 10*3/uL (ref 4.0–10.0)

## 2015-12-19 IMAGING — CT CT CHEST W/ CM
2 of 5 series · 13 of 36 positions shown, 16 images · IV contrast (APPLIED)
Comparison: Abdomen and pelvis CT from [DATE]. Chest CT from
[DATE].

CLINICAL DATA: Metastatic low-grade neuroendocrine tumor with
hepatic metastases. Status post intrahepatic [AGE] treatment x3 and
recent thermal ablation of an enlarging right liver metastasis

EXAM:
CT CHEST, ABDOMEN, AND PELVIS WITH CONTRAST
TECHNIQUE: Multidetector CT imaging of the chest, abdomen and pelvis was
performed following the standard protocol during bolus
administration of intravenous contrast.
CONTRAST:  100mL OMNIPAQUE IOHEXOL 300 MG/ML  SOLN

[Series 2: cap with 2 · axial · 0.96mm/px · z∈[+725,+1290]mm · 10 of 131 slices shown, 13 images]
[im 9/131  mediastinal]
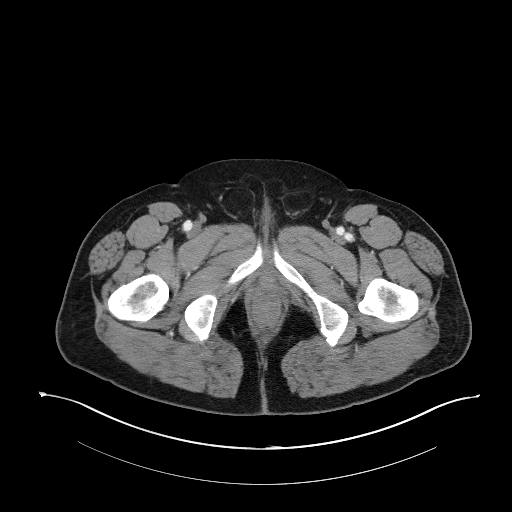
[im 9/131  lung]
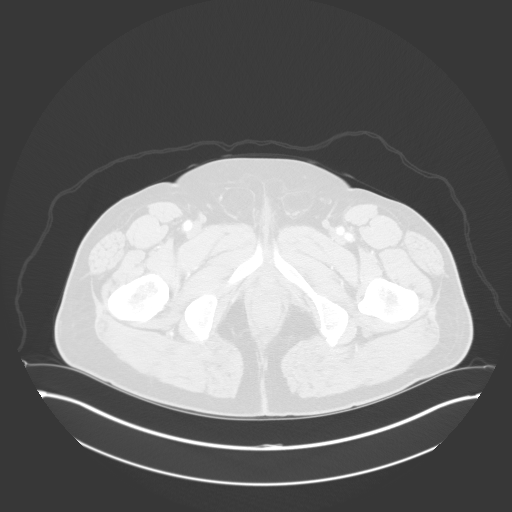
[im 27/131  lung]
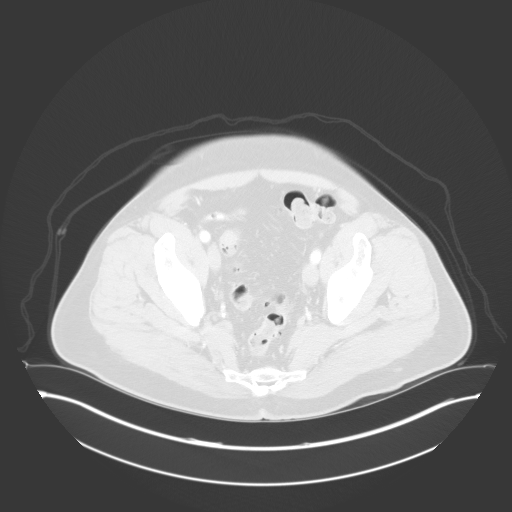
[im 35/131  lung]
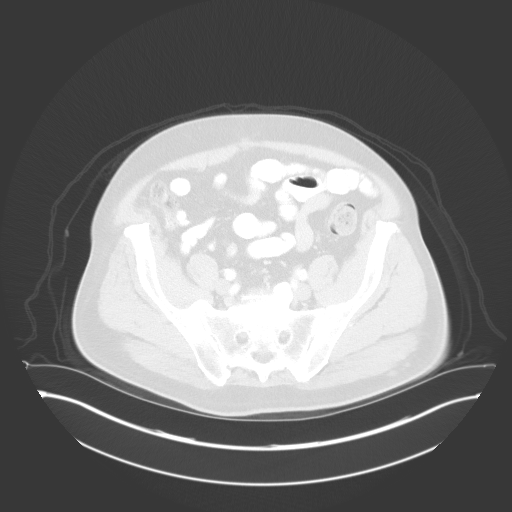
[im 44/131  lung]
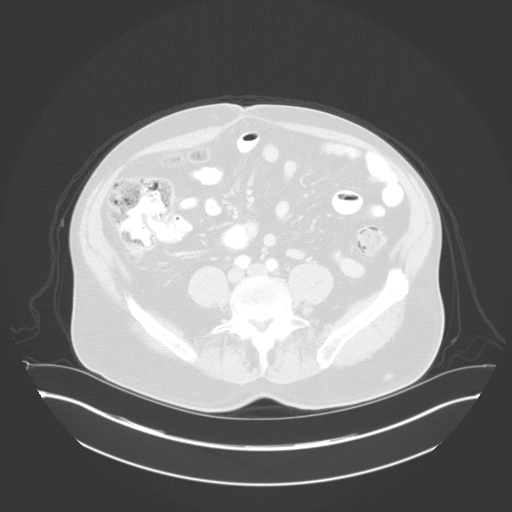
[im 61/131  mediastinal]
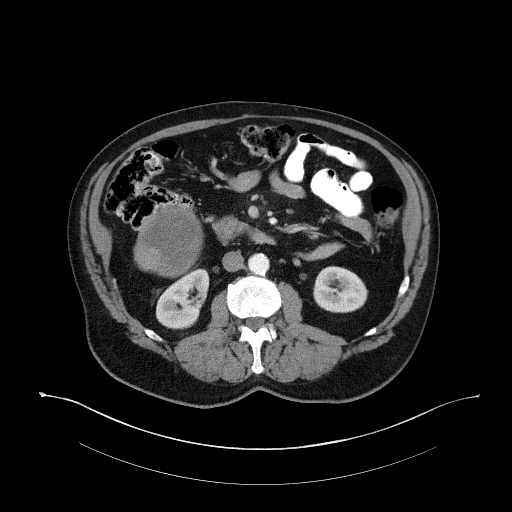
[im 61/131  lung]
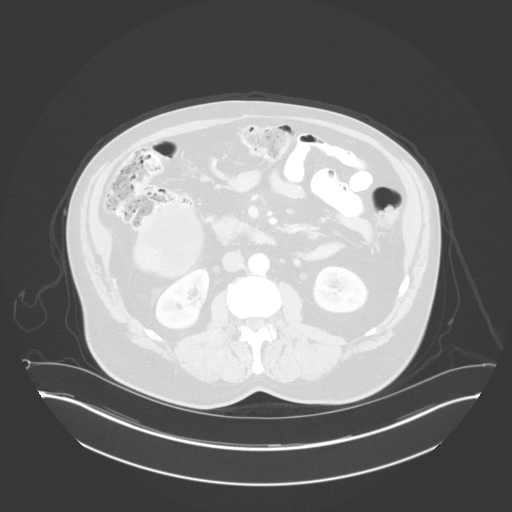
[im 70/131  lung]
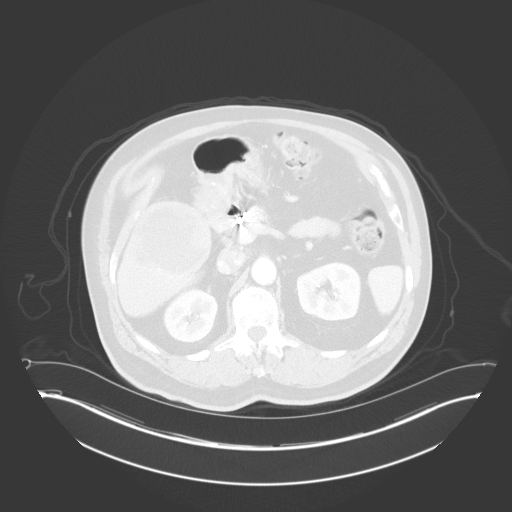
[im 87/131  lung]
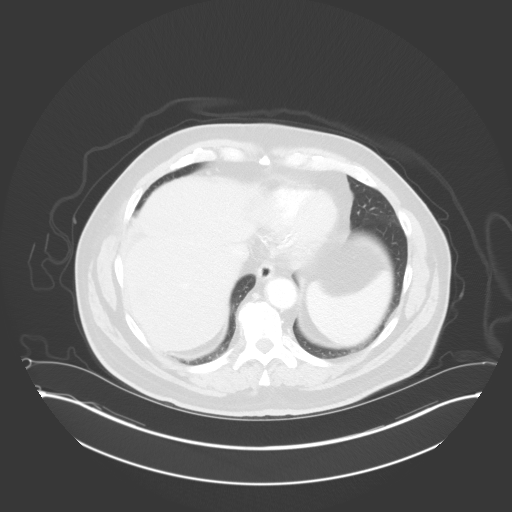
[im 96/131  lung]
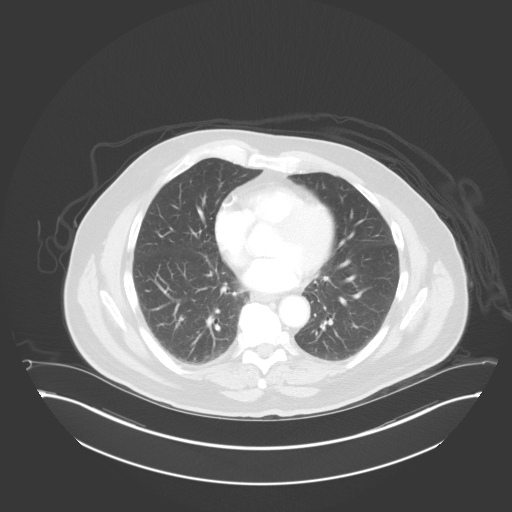
[im 105/131  mediastinal]
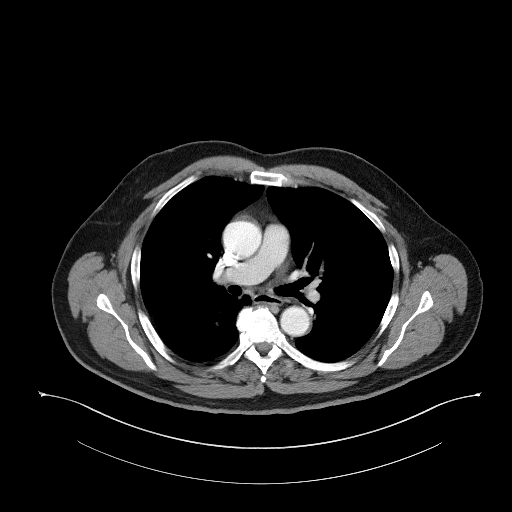
[im 105/131  lung]
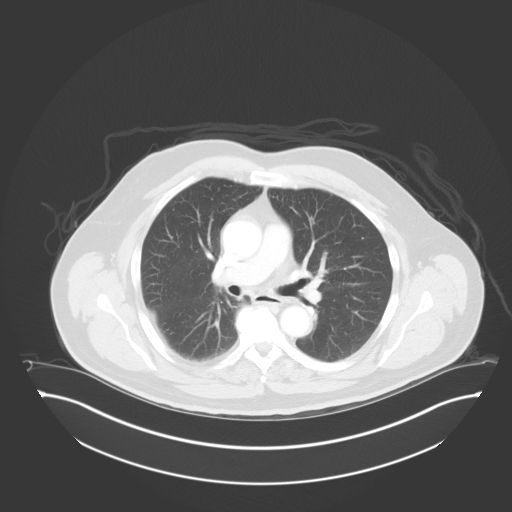
[im 122/131  lung]
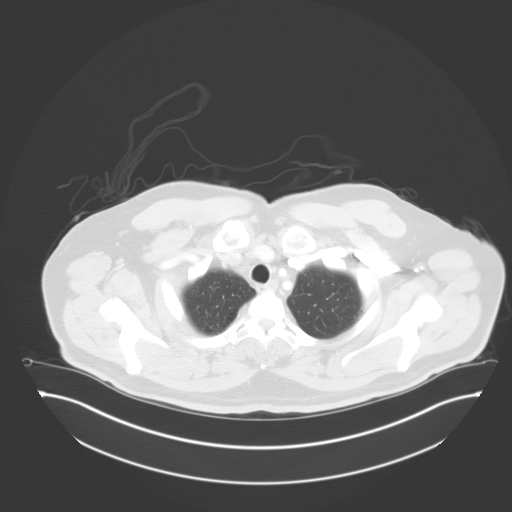

[Series 4: coronals · coronal · 0.93mm/px · 3 of 160 slices shown]
[im 32/160  lung]
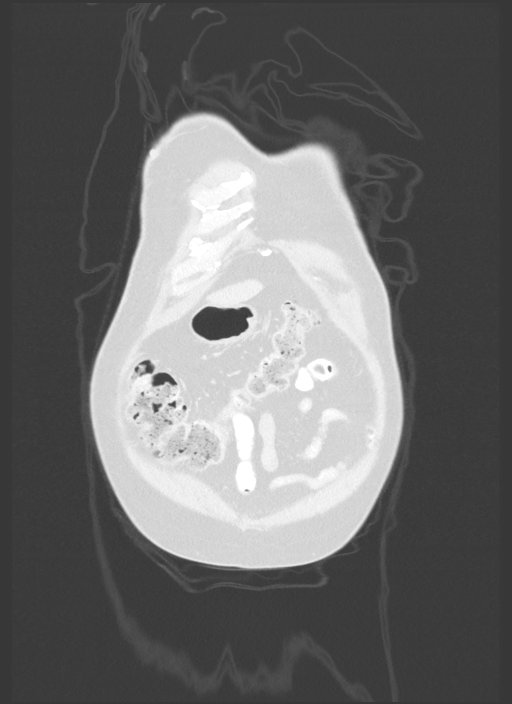
[im 64/160  lung]
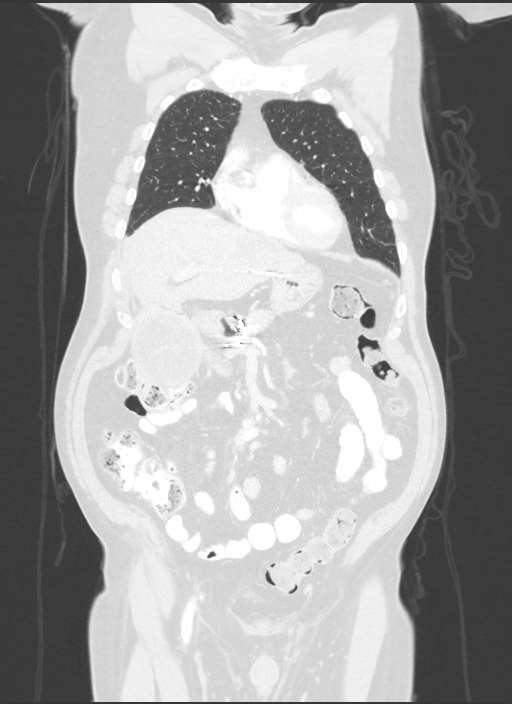
[im 96/160  lung]
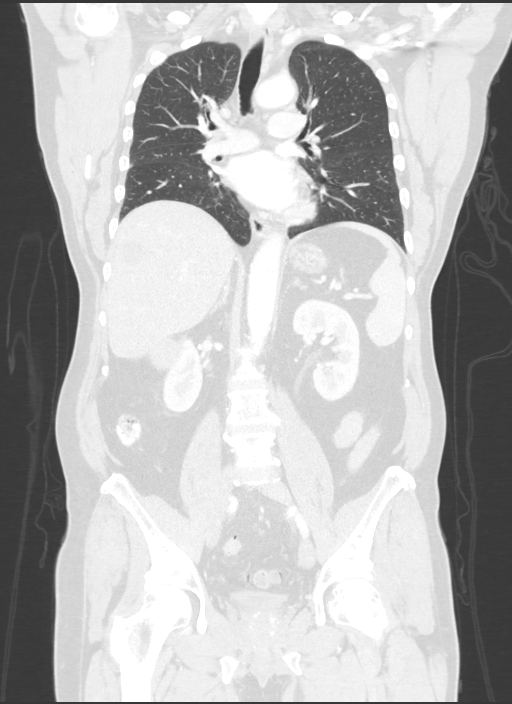

[13 of 36 positions shown; findings below may reference images not displayed]

FINDINGS: CT CHEST FINDINGS

Mediastinum/Lymph Nodes: There is no axillary lymphadenopathy. No
mediastinal lymphadenopathy. There is no hilar lymphadenopathy. The
heart size is normal. No pericardial effusion. Coronary artery
calcification is noted. The esophagus has normal imaging features.

Lungs/Pleura: Calcified granuloma in the lingula unchanged. No other
pulmonary parenchymal nodule or mass. No focal airspace
consolidation. No pulmonary edema or pleural effusion.

Musculoskeletal: Bone windows reveal no worrisome lytic or sclerotic
osseous lesions.

CT ABDOMEN PELVIS FINDINGS

Hepatobiliary: Lateral segment left hepatic lobe mass measures
slightly smaller today at 3.4 x 2.8 cm compared to 4.2 x 3.2 cm
previously. Large inferior cystic and solid lesion in the inferior
right liver measures 9.0 x 8.3 cm today compared to 9.2 x 8.0 cm
previously. 3.3 x 2.4 cm lesion in the medial segment left liver
measures minimally smaller today. 3.3 x 3.3 cm well-defined hypo
enhancing focus in the right liver (image 48 series 2) is compatible
with the ablation defect.

Pancreas: No focal mass lesion. No dilatation of the main duct. No
intraparenchymal cyst. No peripancreatic edema.

Spleen: No splenomegaly. No focal mass lesion.

Adrenals/Urinary Tract: No adrenal nodule or mass. Kidneys are
unremarkable. No evidence for hydroureter. The bladder is
nondistended which likely accentuates the appearance of mild
circumferential bladder wall thickening.

Stomach/Bowel: Stomach is nondistended. No gastric wall thickening.
No evidence of outlet obstruction. Duodenum is normally positioned
as is the ligament of Treitz. No small bowel wall thickening. No
small bowel dilatation. The terminal ileum is normal. The appendix
is not visualized, but there is no edema or inflammation in the
region of the cecum. No gross colonic mass. No colonic wall
thickening. No substantial diverticular change.

Vascular/Lymphatic: There is abdominal aortic atherosclerosis
without aneurysm. No gastrohepatic or hepatoduodenal ligament
lymphadenopathy. 2.3 x 2.9 cm calcified mesenteric nodule is stable,
consistent with metastatic disease. Adjacent small mesenteric lymph
nodes are not substantially changed. There is no retroperitoneal
lymphadenopathy. No pelvic sidewall lymphadenopathy.

Reproductive: Prostate gland is enlarged.

Other: No intraperitoneal free fluid.

Musculoskeletal: Bilateral inguinal hernias contain only fat. Bone
windows reveal no worrisome lytic or sclerotic osseous lesions.
IMPRESSION: 1. Stable CT evaluation of the chest without evidence for metastatic
disease.
2. Multiple liver lesions measure stable to slightly smaller today.
There is a well-defined hypo enhancing lesion in the right liver,
corresponding to the site of recent thermal ablation.
3. Calcified lesion in the central mesentery compatible with
metastatic disease is stable.
4. Prostatomegaly.
5. Bilateral inguinal hernias contain only fat.
Imaging findings of potential clinical significance:

Aortic Atherosclerosis ([6D]-170.0)

1.

## 2015-12-19 MED ORDER — IOHEXOL 300 MG/ML  SOLN
100.0000 mL | Freq: Once | INTRAMUSCULAR | Status: AC | PRN
  Administered 2015-12-19: 100 mL via INTRAVENOUS

## 2015-12-19 MED ORDER — LANREOTIDE ACETATE 120 MG/0.5ML ~~LOC~~ SOLN
120.0000 mg | Freq: Once | SUBCUTANEOUS | Status: AC
  Administered 2015-12-19: 120 mg via SUBCUTANEOUS
  Filled 2015-12-19: qty 120

## 2015-12-19 NOTE — Progress Notes (Signed)
Hematology and Oncology Follow Up Visit  Brian Mays [id number] 1946/07/27 70 y.o. 12/19/2015   Principle Diagnosis:  Metastatic low grade neuroendocrine tumor-hepatic metastases   Current Therapy:    S/p third yttrium-90 intrahepatic therapy-November 2016  Somatuline 120mg  sq q month     Interim History:  [name] is back for followup.He looks good. He had a third intrahepatic procedure on November 11. He tolerated this pretty well. We went ahead and got another CT scan on him. Thankfully , the CT scan did not show any evidence of progression. In fact, look like his hepatic disease was a little bit better.  His last chromogranin A was 41. This is holding pretty stable.  He's had no problems with cough. He's had no diarrhea. She had no wheezing. He's had no rashes. He's had no leg pain. He's had no leg swelling.  He's had no problems with weight loss or weight gain.  Overall, his performance status is ECOG 1   Medications:  Current outpatient prescriptions:  .  amLODipine (NORVASC) 5 MG tablet, TAKE 1 TABLET DAILY, Disp: 30 tablet, Rfl: 11 .  aspirin EC 81 MG tablet, Take 81 mg by mouth every morning., Disp: , Rfl:  .  Cholecalciferol 1000 UNITS tablet, Take 1,000 Units by mouth daily.  , Disp: , Rfl:  .  diphenhydrAMINE (BENADRYL) 50 MG tablet, Take 1 tablet (50 mg total) by mouth once. Take one hour before study, Disp: 2 tablet, Rfl: 0 .  fluticasone (FLONASE) 50 MCG/ACT nasal spray, Place 2 sprays into both nostrils daily as needed (sinuses). , Disp: , Rfl: 11 .  hydrocortisone (ANUSOL-HC) 25 MG suppository, INSERT 1 SUPPOSITORY RECTALLY TWICE A DAY AS NEEDED FOR HEMORRHOIDS, Disp: 30 suppository, Rfl: 0 .  Lanreotide Acetate (SOMATULINE DEPOT [place]), Inject 120 mcg into the skin every 28 (twenty-eight) days. Receives at Dr Antonieta Pert office, Disp: , Rfl:  .  lansoprazole (PREVACID) 30 MG capsule, Take 1 capsule (30 mg total) by mouth daily at 12 noon., Disp: 90 capsule, Rfl:  2 .  levothyroxine (SYNTHROID, LEVOTHROID) 50 MCG tablet, TAKE 1 TABLET DAILY., Disp: 90 tablet, Rfl: 2 .  losartan (COZAAR) 100 MG tablet, TAKE 1 TABLET BY MOUTH EVERY MORNING, Disp: 90 tablet, Rfl: 3 .  metoCLOPramide (REGLAN) 10 MG tablet, Take 10 mg by mouth daily., Disp: , Rfl:  .  Omega-3 Fatty Acids (FISH OIL) 1000 MG CAPS, Take 1 capsule by mouth daily., Disp: , Rfl:  .  Pancrelipase, Lip-Prot-Amyl, (CREON) 24000 UNITS CPEP, 1 po tid with food (Patient taking differently: Take 1 capsule by mouth 3 (three) times daily with meals. ), Disp: 270 capsule, Rfl: 3 .  PARoxetine (PAXIL) 10 MG tablet, Take 1 tablet (10 mg total) by mouth daily., Disp: 30 tablet, Rfl: 3 .  predniSONE (DELTASONE) 50 MG tablet, Take 50mg  at 13 hours, 7 hours, and 1 hour before scan, Disp: 3 tablet, Rfl: 0 .  sodium chloride (OCEAN) 0.65 % SOLN nasal spray, Place 1 spray into both nostrils as needed for congestion. , Disp: , Rfl:  .  vitamin B-12 (CYANOCOBALAMIN) 1000 MCG tablet, Take 1,000 mcg by mouth daily., Disp: , Rfl:   Allergies:  Allergies  Allergen Reactions  . Iohexol      Code: HIVES, Desc: PER [name] @ [facility], PT IS ALLERGIC TO CONTRAST DYE 10/02/08/RM  05/01/10...needs full premeds per our protocol w/ [facility]., Onset Date: [id number]     Past Medical History, Surgical history, Social history,  and Family History were reviewed and updated.  Review of Systems: As above  Physical Exam:  height is 5\' 10"  (1.778 m) and weight is 206 lb (93.441 kg). His oral temperature is 97.7 F (36.5 C). His blood pressure is 160/87 and his pulse is 81. His respiration is 16.   Well-developed and well-nourished white gentleman. Head and exam shows no ocular or oral lesions. There are no palpable cervical or supraclavicular lymph nodes. Thyroid is not palpable. Lungs are clear. Cardiac exam is regular rate and rhythm with a normal S1 and S2. He has no murmurs, rubs or bruits.. Abdomen is soft. He has a well-healed  laparotomy scar. There is no fluid wave. There is no guarding. He is no palpable liver or spleen tip. Back exam shows no tenderness over the spine, ribs or hips. Extremities shows no clubbing, cyanosis or edema. Skin exam no rashes. Neurological exam is nonfocal. Lab Results  Component Value Date   WBC 9.2 12/19/2015   HGB 13.5 12/19/2015   HCT 42.0 12/19/2015   MCV 85 12/19/2015   PLT 184 12/19/2015     Chemistry      Component Value Date/Time   NA 146* 11/17/2015 1322   NA 139 09/05/2015 1050   NA 142 09/03/2015 1319   K 4.7 11/17/2015 1322   K 4.4 09/05/2015 1050   K 3.8 09/03/2015 1319   CL 102 11/17/2015 1322   CL 105 09/05/2015 1050   CO2 30 11/17/2015 1322   CO2 26 09/05/2015 1050   CO2 30* 09/03/2015 1319   BUN 15 11/17/2015 1322   BUN 14 09/05/2015 1050   BUN 13.2 09/03/2015 1319   CREATININE 0.9 11/17/2015 1322   CREATININE 0.74 09/05/2015 1050   CREATININE 0.9 09/03/2015 1319      Component Value Date/Time   CALCIUM 9.4 11/17/2015 1322   CALCIUM 9.0 09/05/2015 1050   CALCIUM 9.7 09/03/2015 1319   ALKPHOS 53 11/17/2015 1322   ALKPHOS 56 09/05/2015 1050   ALKPHOS 66 09/03/2015 1319   AST 27 11/17/2015 1322   AST 28 09/05/2015 1050   AST 24 09/03/2015 1319   ALT 16 11/17/2015 1322   ALT 14* 09/05/2015 1050   ALT 15 09/03/2015 1319   BILITOT 0.70 11/17/2015 1322   BILITOT 0.8 09/05/2015 1050   BILITOT 0.63 09/03/2015 1319         Impression and Plan: Brian Mays is 70 year old gentleman with a neuroendocrine carcinoma. He has liver metastases. He had a third intrahepatic therapy with yttrium-90 back in November 2016. This was for an enlarging left hepatic lobe lesion. A scan, looks like the treatment worked.  I probably would not get a scan on him for another couple months . we will see what his chromogranin A level is.  He sees interventional radiology at the end of the month.   Volanda Napoleon, MD 2/17/201712:28 PM

## 2015-12-19 NOTE — Patient Instructions (Signed)
Lanreotide injection What is this medicine? LANREOTIDE (lan REE oh tide) is used to reduce blood levels of growth hormone in patients with a condition called acromegaly. It also works to slow or stop tumor growth in patients with gastroenteropancreatic neuroendocrine tumor (GEP-NET). This medicine may be used for other purposes; ask your health care provider or pharmacist if you have questions. What should I tell my health care provider before I take this medicine? They need to know if you have any of these conditions: -diabetes -gallbladder disease -heart disease -kidney disease -liver disease -an unusual or allergic reaction to lanreotide, other medicines, latex, foods, dyes, or preservatives -pregnant or trying to get pregnant -breast-feeding How should I use this medicine? This medicine is for injection under the skin. It is given by a health care professional in a hospital or clinic setting. Contact your pediatrician or health care professional regarding the use of this medicine in children. Special care may be needed. Overdosage: If you think you have taken too much of this medicine contact a poison control center or emergency room at once. NOTE: This medicine is only for you. Do not share this medicine with others. What if I miss a dose? It is important not to miss your dose. Call your doctor or health care professional if you are unable to keep an appointment. What may interact with this medicine? -bromocriptine -cyclosporine -medicines for diabetes, including insulin -medicines for heart disease or hypertension -quinidine This list may not describe all possible interactions. Give your health care provider a list of all the medicines, herbs, non-prescription drugs, or dietary supplements you use. Also tell them if you smoke, drink alcohol, or use illegal drugs. Some items may interact with your medicine. What should I watch for while using this medicine? Visit your doctor or  health care professional for regular checks on your progress. Your condition will be monitored carefully while you are receiving this medicine. This medicine may cause increases or decreases in blood sugar. Signs of high blood sugar include frequent urination, unusual thirst, flushed or dry skin, difficulty breathing, drowsiness, stomach ache, nausea, vomiting or dry mouth. Signs of low blood sugar include chills, cool, pale skin or cold sweats, drowsiness, extreme hunger, fast heartbeat, headache, nausea, nervousness or anxiety, shakiness, trembling, unsteadiness, tiredness, or weakness. Contact your doctor or health care professional right away if you experience any of these symptoms. What side effects may I notice from receiving this medicine? Side effects that you should report to your doctor or health care professional as soon as possible: -allergic reactions like skin rash, itching or hives, swelling of the face, lips, or tongue -changes in blood sugar -changes in heart rate -severe stomach pain Side effects that usually do not require medical attention (report to your doctor or health care professional if they continue or are bothersome): -diarrhea or constipation -gas or stomach pain -nausea, vomiting -pain, redness, swelling and irritation at site where injected This list may not describe all possible side effects. Call your doctor for medical advice about side effects. You may report side effects to FDA at 1-800-FDA-1088. Where should I keep my medicine? This drug is given in a hospital or clinic and will not be stored at home. NOTE: This sheet is a summary. It may not cover all possible information. If you have questions about this medicine, talk to your doctor, pharmacist, or health care provider.    2016, Elsevier/Gold Standard. (2013-10-17 17:43:04)  

## 2015-12-22 LAB — CHROMOGRANIN A: CHROMOGRAN A: 3 nmol/L (ref 0–5)

## 2015-12-23 LAB — CHROMOGRANIN A (PARALLEL TESTING): Chromogranin A: 21 ng/mL — ABNORMAL HIGH (ref <=15)

## 2015-12-30 ENCOUNTER — Ambulatory Visit
Admission: RE | Admit: 2015-12-30 | Discharge: 2015-12-30 | Disposition: A | Discharge status: Home / Self Care | Payer: Medicare PPO | Source: Ambulatory Visit | Attending: Radiology | Admitting: Radiology

## 2015-12-30 DIAGNOSIS — C7B8 Other secondary neuroendocrine tumors: Principal | ICD-10-CM

## 2015-12-30 DIAGNOSIS — C7A8 Other malignant neuroendocrine tumors: Secondary | ICD-10-CM

## 2015-12-30 HISTORY — PX: IR GENERIC HISTORICAL: IMG1180011

## 2016-01-12 NOTE — Progress Notes (Signed)
Chief Complaint: Status post thermal ablation of liver for metastatic neuroendocrine carcinoma.  History of Present Illness: Brian Mays is a 70 y.o. male status post prior Y-90 [facility] of liver and more recently thermal ablation of an enlarging right lobe metastasis from neuroendocrine carcinoma on 09/12/15.  He tolerated ablation well and is currently without complaints.  A follow-up restaging CT was performed on 12/18/14.  Past Medical History  Diagnosis Date  . GERD (gastroesophageal reflux disease)   . Pancreatitis 1998    chronic  . HTN (hypertension)   . Hemorrhoid   . Hypothyroidism   . Colon polyps   . Metastatic carcinoma ([place]) 2010    Dr Jonette Eva  . Diverticulosis   . Gallstones   . Hiatal hernia   . Heart murmur   . Anemia     in past    Past Surgical History  Procedure Laterality Date  . Cholecystectomy  1999  . Carcinoil resection  01/2009  . Appendectomy  1964  . Liver biopsy      in 2010  . Tumor removed      from small intestine    Allergies: Iohexol  Medications: Prior to Admission medications   Medication Sig Start Date End Date Taking? Authorizing Provider  amLODipine (NORVASC) 5 MG tablet TAKE 1 TABLET DAILY 05/30/15  Yes Aleksei Plotnikov V, MD  aspirin EC 81 MG tablet Take 81 mg by mouth every morning.   Yes Historical Provider, MD  Cholecalciferol 1000 UNITS tablet Take 1,000 Units by mouth daily.     Yes Historical Provider, MD  diphenhydrAMINE (BENADRYL) 50 MG tablet Take 1 tablet (50 mg total) by mouth once. Take one hour before study 12/15/15  Yes Volanda Napoleon, MD  fluticasone [facility]) 50 MCG/ACT nasal spray Place 2 sprays into both nostrils daily as needed (sinuses).  01/06/15  Yes Historical Provider, MD  hydrocortisone (ANUSOL-HC) 25 MG suppository INSERT 1 SUPPOSITORY RECTALLY TWICE A DAY AS NEEDED FOR HEMORRHOIDS 02/07/15  Yes Belle Rose, NP  Lanreotide Acetate (SOMATULINE DEPOT [place]) Inject 120 mcg into the skin every 28  (twenty-eight) days. Receives at Dr Antonieta Pert office   Yes Historical Provider, MD  lansoprazole (PREVACID) 30 MG capsule Take 1 capsule (30 mg total) by mouth daily at 12 noon. 11/27/15  Yes Aleksei Plotnikov V, MD  levothyroxine (SYNTHROID, LEVOTHROID) 50 MCG tablet TAKE 1 TABLET DAILY. 07/30/15  Yes Aleksei Plotnikov V, MD  losartan (COZAAR) 100 MG tablet TAKE 1 TABLET BY MOUTH EVERY MORNING 03/14/15  Yes Volanda Napoleon, MD  metoCLOPramide (REGLAN) 10 MG tablet Take 10 mg by mouth daily.   Yes Historical Provider, MD  Omega-3 Fatty Acids (FISH OIL) 1000 MG CAPS Take 1 capsule by mouth daily. Reported on 12/30/2015   Yes Historical Provider, MD  Pancrelipase, Lip-Prot-Amyl, (CREON) 24000 UNITS CPEP 1 po tid with food Patient taking differently: Take 1 capsule by mouth 3 (three) times daily with meals.  07/30/15  Yes Aleksei Plotnikov V, MD  PARoxetine (PAXIL) 10 MG tablet Take 1 tablet (10 mg total) by mouth daily. 10/15/15  Yes Volanda Napoleon, MD  predniSONE (DELTASONE) 50 MG tablet Take 50mg  at 13 hours, 7 hours, and 1 hour before scan 12/15/15  Yes Volanda Napoleon, MD  sodium chloride (OCEAN) 0.65 % SOLN nasal spray Place 1 spray into both nostrils as needed for congestion.    Yes Historical Provider, MD  vitamin B-12 (CYANOCOBALAMIN) 1000 MCG tablet Take 1,000 mcg by mouth  daily.   Yes Historical Provider, MD     Family History  Problem Relation Age of Onset  . Kidney disease Mother   . Hyperlipidemia Mother   . Hypertension Mother   . COPD Father   . Ulcerative colitis Daughter     Social History   Social History  . Marital Status: Married    Spouse Name: N/A  . Number of Children: 1  . Years of Education: N/A   Occupational History  . Sales    Social History Main Topics  . Smoking status: Former Smoker -- 1.50 packs/day for 35 years    Types: Cigarettes    Start date: 09/25/1959    Quit date: 11/01/1997  . Smokeless tobacco: Never Used     Comment: quit smoking 18 years  ago  . Alcohol Use: 1.8 - 2.4 oz/week    3-4 Cans of beer per week     Comment: 4 beers weekly   . Drug Use: No  . Sexual Activity: Yes   Other Topics Concern  . Not on file   Social History Narrative   Regular Exercise -  NO    ECOG Status: 0 - Asymptomatic  Review of Systems: A 12 point ROS discussed and pertinent positives are indicated in the HPI above.  All other systems are negative.  Review of Systems  Constitutional: Negative.   Respiratory: Negative.   Cardiovascular: Negative.   Gastrointestinal: Negative.   Genitourinary: Negative.   Musculoskeletal: Negative.   Neurological: Negative.     Vital Signs: BP 143/78 mmHg  Pulse 72  Temp(Src) 98.1 F (36.7 C) (Oral)  Resp 14  Ht 5' 10.5" (1.791 m)  Wt 207 lb (93.895 kg)  BMI 29.27 kg/m2  SpO2 98%  Physical Exam  Constitutional: He is oriented to person, place, and time. He appears well-developed and well-nourished. No distress.  Abdominal: Soft. He exhibits no distension and no mass. There is no tenderness. There is no rebound and no guarding.  Neurological: He is alert and oriented to person, place, and time.  Skin: He is not diaphoretic.    Imaging: Ct Chest W Contrast  12/19/2015  CLINICAL DATA:  Metastatic low-grade neuroendocrine tumor with hepatic metastases. Status post intrahepatic Y90 treatment x3 and recent thermal ablation of an enlarging right liver metastasis EXAM: CT CHEST, ABDOMEN, AND PELVIS WITH CONTRAST TECHNIQUE: Multidetector CT imaging of the chest, abdomen and pelvis was performed following the standard protocol during bolus administration of intravenous contrast. CONTRAST:  124mL OMNIPAQUE IOHEXOL 300 MG/ML  SOLN COMPARISON:  Abdomen and pelvis CT from 07/14/2015. Chest CT from 01/13/2015. FINDINGS: CT CHEST FINDINGS Mediastinum/Lymph Nodes: There is no axillary lymphadenopathy. No mediastinal lymphadenopathy. There is no hilar lymphadenopathy. The heart size is normal. No pericardial  effusion. Coronary artery calcification is noted. The esophagus has normal imaging features. Lungs/Pleura: Calcified granuloma in the lingula unchanged. No other pulmonary parenchymal nodule or mass. No focal airspace consolidation. No pulmonary edema or pleural effusion. Musculoskeletal: Bone windows reveal no worrisome lytic or sclerotic osseous lesions. CT ABDOMEN PELVIS FINDINGS Hepatobiliary: Lateral segment left hepatic lobe mass measures slightly smaller today at 3.4 x 2.8 cm compared to 4.2 x 3.2 cm previously. Large inferior cystic and solid lesion in the inferior right liver measures 9.0 x 8.3 cm today compared to 9.2 x 8.0 cm previously. 3.3 x 2.4 cm lesion in the medial segment left liver measures minimally smaller today. 3.3 x 3.3 cm well-defined hypo enhancing focus in the right liver (image  48 series 2) is compatible with the ablation defect. Pancreas: No focal mass lesion. No dilatation of the main duct. No intraparenchymal cyst. No peripancreatic edema. Spleen: No splenomegaly. No focal mass lesion. Adrenals/Urinary Tract: No adrenal nodule or mass. Kidneys are unremarkable. No evidence for hydroureter. The bladder is nondistended which likely accentuates the appearance of mild circumferential bladder wall thickening. Stomach/Bowel: Stomach is nondistended. No gastric wall thickening. No evidence of outlet obstruction. Duodenum is normally positioned as is the ligament of Treitz. No small bowel wall thickening. No small bowel dilatation. The terminal ileum is normal. The appendix is not visualized, but there is no edema or inflammation in the region of the cecum. No gross colonic mass. No colonic wall thickening. No substantial diverticular change. Vascular/Lymphatic: There is abdominal aortic atherosclerosis without aneurysm. No gastrohepatic or hepatoduodenal ligament lymphadenopathy. 2.3 x 2.9 cm calcified mesenteric nodule is stable, consistent with metastatic disease. Adjacent small mesenteric  lymph nodes are not substantially changed. There is no retroperitoneal lymphadenopathy. No pelvic sidewall lymphadenopathy. Reproductive: Prostate gland is enlarged. Other: No intraperitoneal free fluid. Musculoskeletal: Bilateral inguinal hernias contain only fat. Bone windows reveal no worrisome lytic or sclerotic osseous lesions. IMPRESSION: 1. Stable CT evaluation of the chest without evidence for metastatic disease. 2. Multiple liver lesions measure stable to slightly smaller today. There is a well-defined hypo enhancing lesion in the right liver, corresponding to the site of recent thermal ablation. 3. Calcified lesion in the central mesentery compatible with metastatic disease is stable. 4. Prostatomegaly. 5. Bilateral inguinal hernias contain only fat. Imaging findings of potential clinical significance: Aortic Atherosclerosis (ICD10-170.0) 1. Electronically Signed   By: Misty Stanley M.D.   On: 12/19/2015 10:24   Ct Abdomen Pelvis W Contrast  12/19/2015  CLINICAL DATA:  Metastatic low-grade neuroendocrine tumor with hepatic metastases. Status post intrahepatic Y90 treatment x3 and recent thermal ablation of an enlarging right liver metastasis EXAM: CT CHEST, ABDOMEN, AND PELVIS WITH CONTRAST TECHNIQUE: Multidetector CT imaging of the chest, abdomen and pelvis was performed following the standard protocol during bolus administration of intravenous contrast. CONTRAST:  119mL OMNIPAQUE IOHEXOL 300 MG/ML  SOLN COMPARISON:  Abdomen and pelvis CT from 07/14/2015. Chest CT from 01/13/2015. FINDINGS: CT CHEST FINDINGS Mediastinum/Lymph Nodes: There is no axillary lymphadenopathy. No mediastinal lymphadenopathy. There is no hilar lymphadenopathy. The heart size is normal. No pericardial effusion. Coronary artery calcification is noted. The esophagus has normal imaging features. Lungs/Pleura: Calcified granuloma in the lingula unchanged. No other pulmonary parenchymal nodule or mass. No focal airspace  consolidation. No pulmonary edema or pleural effusion. Musculoskeletal: Bone windows reveal no worrisome lytic or sclerotic osseous lesions. CT ABDOMEN PELVIS FINDINGS Hepatobiliary: Lateral segment left hepatic lobe mass measures slightly smaller today at 3.4 x 2.8 cm compared to 4.2 x 3.2 cm previously. Large inferior cystic and solid lesion in the inferior right liver measures 9.0 x 8.3 cm today compared to 9.2 x 8.0 cm previously. 3.3 x 2.4 cm lesion in the medial segment left liver measures minimally smaller today. 3.3 x 3.3 cm well-defined hypo enhancing focus in the right liver (image 48 series 2) is compatible with the ablation defect. Pancreas: No focal mass lesion. No dilatation of the main duct. No intraparenchymal cyst. No peripancreatic edema. Spleen: No splenomegaly. No focal mass lesion. Adrenals/Urinary Tract: No adrenal nodule or mass. Kidneys are unremarkable. No evidence for hydroureter. The bladder is nondistended which likely accentuates the appearance of mild circumferential bladder wall thickening. Stomach/Bowel: Stomach is nondistended. No gastric wall thickening.  No evidence of outlet obstruction. Duodenum is normally positioned as is the ligament of Treitz. No small bowel wall thickening. No small bowel dilatation. The terminal ileum is normal. The appendix is not visualized, but there is no edema or inflammation in the region of the cecum. No gross colonic mass. No colonic wall thickening. No substantial diverticular change. Vascular/Lymphatic: There is abdominal aortic atherosclerosis without aneurysm. No gastrohepatic or hepatoduodenal ligament lymphadenopathy. 2.3 x 2.9 cm calcified mesenteric nodule is stable, consistent with metastatic disease. Adjacent small mesenteric lymph nodes are not substantially changed. There is no retroperitoneal lymphadenopathy. No pelvic sidewall lymphadenopathy. Reproductive: Prostate gland is enlarged. Other: No intraperitoneal free fluid.  Musculoskeletal: Bilateral inguinal hernias contain only fat. Bone windows reveal no worrisome lytic or sclerotic osseous lesions. IMPRESSION: 1. Stable CT evaluation of the chest without evidence for metastatic disease. 2. Multiple liver lesions measure stable to slightly smaller today. There is a well-defined hypo enhancing lesion in the right liver, corresponding to the site of recent thermal ablation. 3. Calcified lesion in the central mesentery compatible with metastatic disease is stable. 4. Prostatomegaly. 5. Bilateral inguinal hernias contain only fat. Imaging findings of potential clinical significance: Aortic Atherosclerosis (ICD10-170.0) 1. Electronically Signed   By: Misty Stanley M.D.   On: 12/19/2015 10:24    Labs:  CBC:  Recent Labs  09/05/15 1050 10/13/15 1445 11/17/15 1321 12/19/15 1007  WBC 7.0 6.2 6.3 9.2  HGB 13.0 12.8* 13.3 13.5  HCT 40.3 39.3 41.4 42.0  PLT 171 142* 161 184    COAGS:  Recent Labs  09/05/15 1050  INR 1.00  APTT 28    BMP:  Recent Labs  07/14/15 1114  09/05/15 1050 10/13/15 1451 11/17/15 1322 12/19/15 1008  NA 140  < > 139 133 146* 138  K 4.1  < > 4.4 4.2 4.7 4.3  CL 100  --  105 101 102  --   CO2 28  < > 26 29 30 26   GLUCOSE 131*  < > 100* 97 107 123  BUN 14  < > 14 11 15  16.1  CALCIUM 8.7  < > 9.0 9.3 9.4 9.5  CREATININE 1.0  < > 0.74 1.0 0.9 0.9  GFRNONAA  --   --  >60  --   --   --   GFRAA  --   --  >60  --   --   --   < > = values in this interval not displayed.  LIVER FUNCTION TESTS:  Recent Labs  09/05/15 1050 10/13/15 1451 11/17/15 1322 12/19/15 1008  BILITOT 0.8 0.70 0.70 0.81  AST 28 33 27 27  ALT 14* 20 16 18   ALKPHOS 56 61 53 72  PROT 7.8 7.6 8.4* 8.7*  ALBUMIN 3.5 3.1* 3.2* 3.6    TUMOR MARKERS:  Recent Labs  09/03/15 1319 10/13/15 1445 11/17/15 1321 12/19/15 1008  CHROMGRNA 48* 30* 41* 21*    Assessment and Plan:  Mr. Gort is doing well after ablation of a right lobe liver metastasis last  November.  CT shows a well circumscribed ablation defect.  Other lesions are stable.  Chromogranin level is 21, down from 48 just prior to ablation.  I reviewed CT findings with him and his wife.  Will continue to monitor per Dr. Marin Olp.  Electronically SignedAletta Edouard T 01/12/2016, 8:46 AM   I spent a total of 15 Minutes in face to face in clinical consultation, greater than 50% of which was counseling/coordinating care for metastatic  neuroendocrine carcinoma to liver.

## 2016-01-16 ENCOUNTER — Ambulatory Visit (HOSPITAL_BASED_OUTPATIENT_CLINIC_OR_DEPARTMENT_OTHER): Payer: Medicare PPO | Admitting: Family

## 2016-01-16 ENCOUNTER — Ambulatory Visit (HOSPITAL_BASED_OUTPATIENT_CLINIC_OR_DEPARTMENT_OTHER): Payer: Medicare PPO

## 2016-01-16 ENCOUNTER — Other Ambulatory Visit (HOSPITAL_BASED_OUTPATIENT_CLINIC_OR_DEPARTMENT_OTHER): Payer: Medicare PPO

## 2016-01-16 ENCOUNTER — Encounter: Payer: Self-pay | Admitting: Family

## 2016-01-16 VITALS — BP 134/76 | HR 63 | Temp 98.1°F | Resp 16 | Ht 70.0 in | Wt 205.0 lb

## 2016-01-16 DIAGNOSIS — C7A8 Other malignant neuroendocrine tumors: Secondary | ICD-10-CM

## 2016-01-16 DIAGNOSIS — C787 Secondary malignant neoplasm of liver and intrahepatic bile duct: Secondary | ICD-10-CM

## 2016-01-16 DIAGNOSIS — C7B8 Other secondary neuroendocrine tumors: Secondary | ICD-10-CM

## 2016-01-16 DIAGNOSIS — E340 Carcinoid syndrome: Secondary | ICD-10-CM

## 2016-01-16 DIAGNOSIS — D499 Neoplasm of unspecified behavior of unspecified site: Secondary | ICD-10-CM

## 2016-01-16 LAB — COMPREHENSIVE METABOLIC PANEL
ALBUMIN: 3.5 g/dL (ref 3.5–5.0)
ALK PHOS: 73 U/L (ref 40–150)
ALT: 16 U/L (ref 0–55)
ANION GAP: 7 meq/L (ref 3–11)
AST: 23 U/L (ref 5–34)
BUN: 14.2 mg/dL (ref 7.0–26.0)
CALCIUM: 9.3 mg/dL (ref 8.4–10.4)
CHLORIDE: 104 meq/L (ref 98–109)
CO2: 30 mEq/L — ABNORMAL HIGH (ref 22–29)
Creatinine: 0.9 mg/dL (ref 0.7–1.3)
EGFR: 82 mL/min/{1.73_m2} — ABNORMAL LOW (ref >90)
Glucose: 97 mg/dl (ref 70–140)
POTASSIUM: 4.1 meq/L (ref 3.5–5.1)
Sodium: 141 mEq/L (ref 136–145)
Total Bilirubin: 0.57 mg/dL (ref 0.20–1.20)
Total Protein: 8.1 g/dL (ref 6.4–8.3)

## 2016-01-16 LAB — CBC WITH DIFFERENTIAL (CANCER CENTER ONLY)
BASO#: 0 10*3/uL (ref 0.0–0.2)
BASO%: 0.5 % (ref 0.0–2.0)
EOS ABS: 0.4 10*3/uL (ref 0.0–0.5)
EOS%: 5.6 % (ref 0.0–7.0)
HEMATOCRIT: 40.3 % (ref 38.7–49.9)
HEMOGLOBIN: 13.1 g/dL (ref 13.0–17.1)
LYMPH#: 1.3 10*3/uL (ref 0.9–3.3)
LYMPH%: 20.3 % (ref 14.0–48.0)
MCH: 27.8 pg — AB (ref 28.0–33.4)
MCHC: 32.5 g/dL (ref 32.0–35.9)
MCV: 86 fL (ref 82–98)
MONO#: 0.5 10*3/uL (ref 0.1–0.9)
MONO%: 8.2 % (ref 0.0–13.0)
NEUT%: 65.4 % (ref 40.0–80.0)
NEUTROS ABS: 4.3 10*3/uL (ref 1.5–6.5)
Platelets: 175 10*3/uL (ref 145–400)
RBC: 4.71 10*6/uL (ref 4.20–5.70)
RDW: 14.9 % (ref 11.1–15.7)
WBC: 6.6 10*3/uL (ref 4.0–10.0)

## 2016-01-16 MED ORDER — LANREOTIDE ACETATE 120 MG/0.5ML ~~LOC~~ SOLN
120.0000 mg | Freq: Once | SUBCUTANEOUS | Status: AC
  Administered 2016-01-16: 120 mg via SUBCUTANEOUS
  Filled 2016-01-16: qty 120

## 2016-01-16 NOTE — Progress Notes (Signed)
Hematology and Oncology Follow Up Visit  Brian Mays [id number] June 13, 1946 71 y.o. 01/16/2016   Principle Diagnosis:  Metastatic low grade neuroendocrine tumor-hepatic metastases  Current Therapy:    S/p third yttrium-90 intrahepatic therapy - November 2016  Somatuline 120 mg monthly    Interim History:  Brian Mays is here today for a follow-up. He is still doing well and has no complaints at this time. His scan in February showed no evidence of progression. At that time his Chromogranin A was down to 3.  He is staying active and is currently building a shed for his lawnmower in the backyard. He is ready for the weather to warm up.  No fatigue, fever, chills, n/v, cough, rash, headache, dizziness, SOB, chest pain, palpitations, abdominal pain or changes in bowel or bladder habits.  No swelling, tenderness, numbness or tingling in his extremities. No c/o joint aches and "bone" pain.   He continues to maintain a healthy appetite and is staying hydrated. His weight is unchanged.  Medications:    Medication List       This list is accurate as of: 01/16/16 12:26 PM.  Always use your most recent med list.               amLODipine 5 MG tablet  Commonly known as:  NORVASC  TAKE 1 TABLET DAILY     aspirin EC 81 MG tablet  Take 81 mg by mouth every morning.     Cholecalciferol 1000 units tablet  Take 1,000 Units by mouth daily.     diphenhydrAMINE 50 MG tablet  Commonly known as:  BENADRYL  Take 1 tablet (50 mg total) by mouth once. Take one hour before study     Fish Oil 1000 MG Caps  Take 1 capsule by mouth daily. Reported on 12/30/2015     fluticasone 50 MCG/ACT nasal spray  Commonly known as:  FLONASE  Place 2 sprays into both nostrils daily as needed (sinuses).     hydrocortisone 25 MG suppository  Commonly known as:  ANUSOL-HC  INSERT 1 SUPPOSITORY RECTALLY TWICE A DAY AS NEEDED FOR HEMORRHOIDS     lansoprazole 30 MG capsule  Commonly known as:  PREVACID  Take 1  capsule (30 mg total) by mouth daily at 12 noon.     levothyroxine 50 MCG tablet  Commonly known as:  SYNTHROID, LEVOTHROID  TAKE 1 TABLET DAILY.     losartan 100 MG tablet  Commonly known as:  COZAAR  TAKE 1 TABLET BY MOUTH EVERY MORNING     metoCLOPramide 10 MG tablet  Commonly known as:  REGLAN  Take 10 mg by mouth daily.     Pancrelipase (Lip-Prot-Amyl) 24000 units Cpep  Commonly known as:  CREON  1 po tid with food     PARoxetine 10 MG tablet  Commonly known as:  PAXIL  Take 1 tablet (10 mg total) by mouth daily.     predniSONE 50 MG tablet  Commonly known as:  DELTASONE  Take 50mg  at 13 hours, 7 hours, and 1 hour before scan     sodium chloride 0.65 % Soln nasal spray  Commonly known as:  OCEAN  Place 1 spray into both nostrils as needed for congestion.     SOMATULINE DEPOT [place]  Inject 120 mcg into the skin every 28 (twenty-eight) days. Receives at Dr Antonieta Pert office     vitamin B-12 1000 MCG tablet  Commonly known as:  CYANOCOBALAMIN  Take 1,000 mcg by mouth daily.  Allergies:  Allergies  Allergen Reactions  . Iohexol      Code: HIVES, Desc: PER [name] @ [facility], PT IS ALLERGIC TO CONTRAST DYE 10/02/08/RM  05/01/10...needs full premeds per our protocol w/ [facility]., Onset Date: [id number]     Past Medical History, Surgical history, Social history, and Family History were reviewed and updated.  Review of Systems: All other 10 point review of systems is negative.   Physical Exam:  height is 5\' 10"  (1.778 m) and weight is 205 lb (92.987 kg). His oral temperature is 98.1 F (36.7 C). His blood pressure is 134/76 and his pulse is 63. His respiration is 16.   Wt Readings from Last 3 Encounters:  01/16/16 205 lb (92.987 kg)  12/30/15 207 lb (93.895 kg)  12/19/15 206 lb (93.441 kg)    Ocular: Sclerae unicteric, pupils equal, round and reactive to light Ear-nose-throat: Oropharynx clear, dentition fair Lymphatic: No cervical supraclavicular or  axillary adenopathy Lungs no rales or rhonchi, good excursion bilaterally Heart regular rate and rhythm, no murmur appreciated Abd soft, nontender, positive bowel sounds, no spleen or liver tip palpated on exam, no fluid wave MSK no focal spinal tenderness, no joint edema Neuro: non-focal, well-oriented, appropriate affect Breasts: Deferred  Lab Results  Component Value Date   WBC 6.6 01/16/2016   HGB 13.1 01/16/2016   HCT 40.3 01/16/2016   MCV 86 01/16/2016   PLT 175 01/16/2016   No results found for: FERRITIN, IRON, TIBC, UIBC, IRONPCTSAT Lab Results  Component Value Date   RBC 4.71 01/16/2016   No results found for: KPAFRELGTCHN, LAMBDASER, KAPLAMBRATIO No results found for: IGGSERUM, IGA, IGMSERUM No results found for: Odetta Pink, SPEI   Chemistry      Component Value Date/Time   NA 138 12/19/2015 1008   NA 146* 11/17/2015 1322   NA 139 09/05/2015 1050   K 4.3 12/19/2015 1008   K 4.7 11/17/2015 1322   K 4.4 09/05/2015 1050   CL 102 11/17/2015 1322   CL 105 09/05/2015 1050   CO2 26 12/19/2015 1008   CO2 30 11/17/2015 1322   CO2 26 09/05/2015 1050   BUN 16.1 12/19/2015 1008   BUN 15 11/17/2015 1322   BUN 14 09/05/2015 1050   CREATININE 0.9 12/19/2015 1008   CREATININE 0.9 11/17/2015 1322   CREATININE 0.74 09/05/2015 1050      Component Value Date/Time   CALCIUM 9.5 12/19/2015 1008   CALCIUM 9.4 11/17/2015 1322   CALCIUM 9.0 09/05/2015 1050   ALKPHOS 72 12/19/2015 1008   ALKPHOS 53 11/17/2015 1322   ALKPHOS 56 09/05/2015 1050   AST 27 12/19/2015 1008   AST 27 11/17/2015 1322   AST 28 09/05/2015 1050   ALT 18 12/19/2015 1008   ALT 16 11/17/2015 1322   ALT 14* 09/05/2015 1050   BILITOT 0.81 12/19/2015 1008   BILITOT 0.70 11/17/2015 1322   BILITOT 0.8 09/05/2015 1050     Impression and Plan: Brian Mays is 70 year old gentleman with a neuroendocrine carcinoma and liver metastases. He had a third  intrahepatic therapy with yttrium-90 back in November 2016. He continues to do well and is asymptomatic at this time. His chromogranin A has continued to come down and was 3 in February. Level today is pending.  He will continue to receive his Somatuline injections monthly.  We will plan to see him back in 2 month for labs and follow-up. We will hold off on scanning him at  this time.  He knows to contact us with any questions or concerns. We can certainly see him sooner if need be.   Eliezer Bottom, NP 3/17/201712:26 PM

## 2016-01-16 NOTE — Patient Instructions (Signed)
Lanreotide injection What is this medicine? LANREOTIDE (lan REE oh tide) is used to reduce blood levels of growth hormone in patients with a condition called acromegaly. It also works to slow or stop tumor growth in patients with gastroenteropancreatic neuroendocrine tumor (GEP-NET). This medicine may be used for other purposes; ask your health care provider or pharmacist if you have questions. What should I tell my health care provider before I take this medicine? They need to know if you have any of these conditions: -diabetes -gallbladder disease -heart disease -kidney disease -liver disease -an unusual or allergic reaction to lanreotide, other medicines, latex, foods, dyes, or preservatives -pregnant or trying to get pregnant -breast-feeding How should I use this medicine? This medicine is for injection under the skin. It is given by a health care professional in a hospital or clinic setting. Contact your pediatrician or health care professional regarding the use of this medicine in children. Special care may be needed. Overdosage: If you think you have taken too much of this medicine contact a poison control center or emergency room at once. NOTE: This medicine is only for you. Do not share this medicine with others. What if I miss a dose? It is important not to miss your dose. Call your doctor or health care professional if you are unable to keep an appointment. What may interact with this medicine? -bromocriptine -cyclosporine -medicines for diabetes, including insulin -medicines for heart disease or hypertension -quinidine This list may not describe all possible interactions. Give your health care provider a list of all the medicines, herbs, non-prescription drugs, or dietary supplements you use. Also tell them if you smoke, drink alcohol, or use illegal drugs. Some items may interact with your medicine. What should I watch for while using this medicine? Visit your doctor or  health care professional for regular checks on your progress. Your condition will be monitored carefully while you are receiving this medicine. This medicine may cause increases or decreases in blood sugar. Signs of high blood sugar include frequent urination, unusual thirst, flushed or dry skin, difficulty breathing, drowsiness, stomach ache, nausea, vomiting or dry mouth. Signs of low blood sugar include chills, cool, pale skin or cold sweats, drowsiness, extreme hunger, fast heartbeat, headache, nausea, nervousness or anxiety, shakiness, trembling, unsteadiness, tiredness, or weakness. Contact your doctor or health care professional right away if you experience any of these symptoms. What side effects may I notice from receiving this medicine? Side effects that you should report to your doctor or health care professional as soon as possible: -allergic reactions like skin rash, itching or hives, swelling of the face, lips, or tongue -changes in blood sugar -changes in heart rate -severe stomach pain Side effects that usually do not require medical attention (report to your doctor or health care professional if they continue or are bothersome): -diarrhea or constipation -gas or stomach pain -nausea, vomiting -pain, redness, swelling and irritation at site where injected This list may not describe all possible side effects. Call your doctor for medical advice about side effects. You may report side effects to FDA at 1-800-FDA-1088. Where should I keep my medicine? This drug is given in a hospital or clinic and will not be stored at home. NOTE: This sheet is a summary. It may not cover all possible information. If you have questions about this medicine, talk to your doctor, pharmacist, or health care provider.    2016, Elsevier/Gold Standard. (2013-10-17 17:43:04)  

## 2016-01-19 LAB — CHROMOGRANIN A: Chromogranin A: 7 nmol/L — ABNORMAL HIGH (ref 0–5)

## 2016-01-26 ENCOUNTER — Encounter: Payer: Self-pay | Admitting: Internal Medicine

## 2016-01-30 ENCOUNTER — Other Ambulatory Visit: Payer: Self-pay | Admitting: *Deleted

## 2016-01-30 DIAGNOSIS — D499 Neoplasm of unspecified behavior of unspecified site: Secondary | ICD-10-CM

## 2016-01-30 MED ORDER — PAROXETINE HCL 10 MG PO TABS: 10.0000 mg | ORAL_TABLET | Freq: Every day | ORAL | Status: DC

## 2016-02-04 ENCOUNTER — Other Ambulatory Visit: Payer: Self-pay | Admitting: Internal Medicine

## 2016-02-05 ENCOUNTER — Other Ambulatory Visit: Payer: Self-pay | Admitting: *Deleted

## 2016-02-05 DIAGNOSIS — D499 Neoplasm of unspecified behavior of unspecified site: Secondary | ICD-10-CM

## 2016-02-05 MED ORDER — PAROXETINE HCL 10 MG PO TABS: 10.0000 mg | ORAL_TABLET | Freq: Every day | ORAL | Status: DC

## 2016-02-09 ENCOUNTER — Inpatient Hospital Stay (HOSPITAL_COMMUNITY)
Admission: EM | Admit: 2016-02-09 | Discharge: 2016-02-11 | DRG: 247 | Disposition: A | Discharge status: Home / Self Care | Payer: Medicare PPO | Attending: Cardiology | Admitting: Cardiology

## 2016-02-09 ENCOUNTER — Ambulatory Visit (INDEPENDENT_AMBULATORY_CARE_PROVIDER_SITE_OTHER): Payer: Medicare PPO | Admitting: Family Medicine

## 2016-02-09 ENCOUNTER — Emergency Department (HOSPITAL_COMMUNITY): Payer: Medicare PPO

## 2016-02-09 ENCOUNTER — Encounter (HOSPITAL_COMMUNITY): Payer: Self-pay | Admitting: Emergency Medicine

## 2016-02-09 VITALS — BP 142/80 | HR 74 | Temp 98.3°F | Ht 70.5 in | Wt 203.2 lb

## 2016-02-09 DIAGNOSIS — I200 Unstable angina: Secondary | ICD-10-CM | POA: Diagnosis present

## 2016-02-09 DIAGNOSIS — Z961 Presence of intraocular lens: Secondary | ICD-10-CM | POA: Diagnosis present

## 2016-02-09 DIAGNOSIS — K219 Gastro-esophageal reflux disease without esophagitis: Secondary | ICD-10-CM | POA: Diagnosis present

## 2016-02-09 DIAGNOSIS — Z955 Presence of coronary angioplasty implant and graft: Secondary | ICD-10-CM

## 2016-02-09 DIAGNOSIS — E785 Hyperlipidemia, unspecified: Secondary | ICD-10-CM | POA: Diagnosis present

## 2016-02-09 DIAGNOSIS — Z87891 Personal history of nicotine dependence: Secondary | ICD-10-CM | POA: Diagnosis not present

## 2016-02-09 DIAGNOSIS — Z7952 Long term (current) use of systemic steroids: Secondary | ICD-10-CM | POA: Diagnosis not present

## 2016-02-09 DIAGNOSIS — R12 Heartburn: Secondary | ICD-10-CM | POA: Diagnosis not present

## 2016-02-09 DIAGNOSIS — Z8719 Personal history of other diseases of the digestive system: Secondary | ICD-10-CM | POA: Diagnosis not present

## 2016-02-09 DIAGNOSIS — R079 Chest pain, unspecified: Secondary | ICD-10-CM

## 2016-02-09 DIAGNOSIS — Z7982 Long term (current) use of aspirin: Secondary | ICD-10-CM | POA: Diagnosis not present

## 2016-02-09 DIAGNOSIS — Z9861 Coronary angioplasty status: Secondary | ICD-10-CM

## 2016-02-09 DIAGNOSIS — Z7951 Long term (current) use of inhaled steroids: Secondary | ICD-10-CM | POA: Diagnosis not present

## 2016-02-09 DIAGNOSIS — E039 Hypothyroidism, unspecified: Secondary | ICD-10-CM | POA: Diagnosis present

## 2016-02-09 DIAGNOSIS — I214 Non-ST elevation (NSTEMI) myocardial infarction: Principal | ICD-10-CM | POA: Diagnosis present

## 2016-02-09 DIAGNOSIS — Z9842 Cataract extraction status, left eye: Secondary | ICD-10-CM

## 2016-02-09 DIAGNOSIS — I10 Essential (primary) hypertension: Secondary | ICD-10-CM | POA: Diagnosis present

## 2016-02-09 DIAGNOSIS — Z9841 Cataract extraction status, right eye: Secondary | ICD-10-CM | POA: Diagnosis not present

## 2016-02-09 LAB — LIPID PANEL
Cholesterol: 147 mg/dL (ref 0–200)
HDL: 35 mg/dL — ABNORMAL LOW (ref >40)
LDL CALC: 100 mg/dL — AB (ref 0–99)
TRIGLYCERIDES: 59 mg/dL (ref <150)
Total CHOL/HDL Ratio: 4.2 RATIO
VLDL: 12 mg/dL (ref 0–40)

## 2016-02-09 LAB — BRAIN NATRIURETIC PEPTIDE: B Natriuretic Peptide: 55.5 pg/mL (ref 0.0–100.0)

## 2016-02-09 LAB — PROTIME-INR
INR: 1.06 (ref 0.00–1.49)
PROTHROMBIN TIME: 14 s (ref 11.6–15.2)

## 2016-02-09 LAB — BASIC METABOLIC PANEL
Anion gap: 10 (ref 5–15)
BUN: 23 mg/dL — AB (ref 6–20)
CHLORIDE: 101 mmol/L (ref 101–111)
CO2: 28 mmol/L (ref 22–32)
Calcium: 9.5 mg/dL (ref 8.9–10.3)
Creatinine, Ser: 0.95 mg/dL (ref 0.61–1.24)
GFR calc Af Amer: >60 mL/min (ref >60)
GFR calc non Af Amer: >60 mL/min (ref >60)
GLUCOSE: 94 mg/dL (ref 65–99)
POTASSIUM: 5 mmol/L (ref 3.5–5.1)
Sodium: 139 mmol/L (ref 135–145)

## 2016-02-09 LAB — TROPONIN I
TNIDX: 0.24 ug/l — ABNORMAL HIGH (ref 0.00–0.06)
Troponin I: 0.15 ng/mL — ABNORMAL HIGH (ref <0.031)

## 2016-02-09 LAB — CBC
HCT: 40.6 % (ref 39.0–52.0)
HEMATOCRIT: 41.5 % (ref 39.0–52.0)
Hemoglobin: 13.5 g/dL (ref 13.0–17.0)
Hemoglobin: 13.3 g/dL (ref 13.0–17.0)
MCH: 27.5 pg (ref 26.0–34.0)
MCHC: 33.2 g/dL (ref 30.0–36.0)
MCHC: 32 g/dL (ref 30.0–36.0)
MCV: 82.8 fl (ref 78.0–100.0)
MCV: 85.9 fL (ref 78.0–100.0)
Platelets: 202 10*3/uL (ref 150.0–400.0)
Platelets: 191 10*3/uL (ref 150–400)
RBC: 4.91 Mil/uL (ref 4.22–5.81)
RBC: 4.83 MIL/uL (ref 4.22–5.81)
RDW: 15.3 % (ref 11.5–15.5)
RDW: 14.8 % (ref 11.5–15.5)
WBC: 9.3 10*3/uL (ref 4.0–10.5)
WBC: 9.1 10*3/uL (ref 4.0–10.5)

## 2016-02-09 LAB — COMPREHENSIVE METABOLIC PANEL
ALK PHOS: 62 U/L (ref 39–117)
ALT: 15 U/L (ref 0–53)
AST: 26 U/L (ref 0–37)
Albumin: 3.9 g/dL (ref 3.5–5.2)
BILIRUBIN TOTAL: 0.5 mg/dL (ref 0.2–1.2)
BUN: 20 mg/dL (ref 6–23)
CO2: 30 mEq/L (ref 19–32)
Calcium: 9.5 mg/dL (ref 8.4–10.5)
Chloride: 100 mEq/L (ref 96–112)
Creatinine, Ser: 0.8 mg/dL (ref 0.40–1.50)
GFR: 101.88 mL/min (ref >60.00)
GLUCOSE: 88 mg/dL (ref 70–99)
Potassium: 4.1 mEq/L (ref 3.5–5.1)
SODIUM: 137 meq/L (ref 135–145)
TOTAL PROTEIN: 8.1 g/dL (ref 6.0–8.3)

## 2016-02-09 LAB — AMYLASE: Amylase: 23 U/L — ABNORMAL LOW (ref 27–131)

## 2016-02-09 LAB — I-STAT TROPONIN, ED: TROPONIN I, POC: 0.26 ng/mL — AB (ref 0.00–0.08)

## 2016-02-09 LAB — LIPASE: Lipase: 15 U/L (ref 11.0–59.0)

## 2016-02-09 LAB — APTT: APTT: 28 s (ref 24–37)

## 2016-02-09 LAB — TSH: TSH: 3.764 u[IU]/mL (ref 0.350–4.500)

## 2016-02-09 IMAGING — DX DG CHEST 2V
2 series · 2 of 2 positions shown · non-contrast
Comparison: CT scan of the chest dated [DATE]

CLINICAL DATA: Mid chest pain and shortness of breath.

EXAM:
CHEST  2 VIEW

[w chest pa]
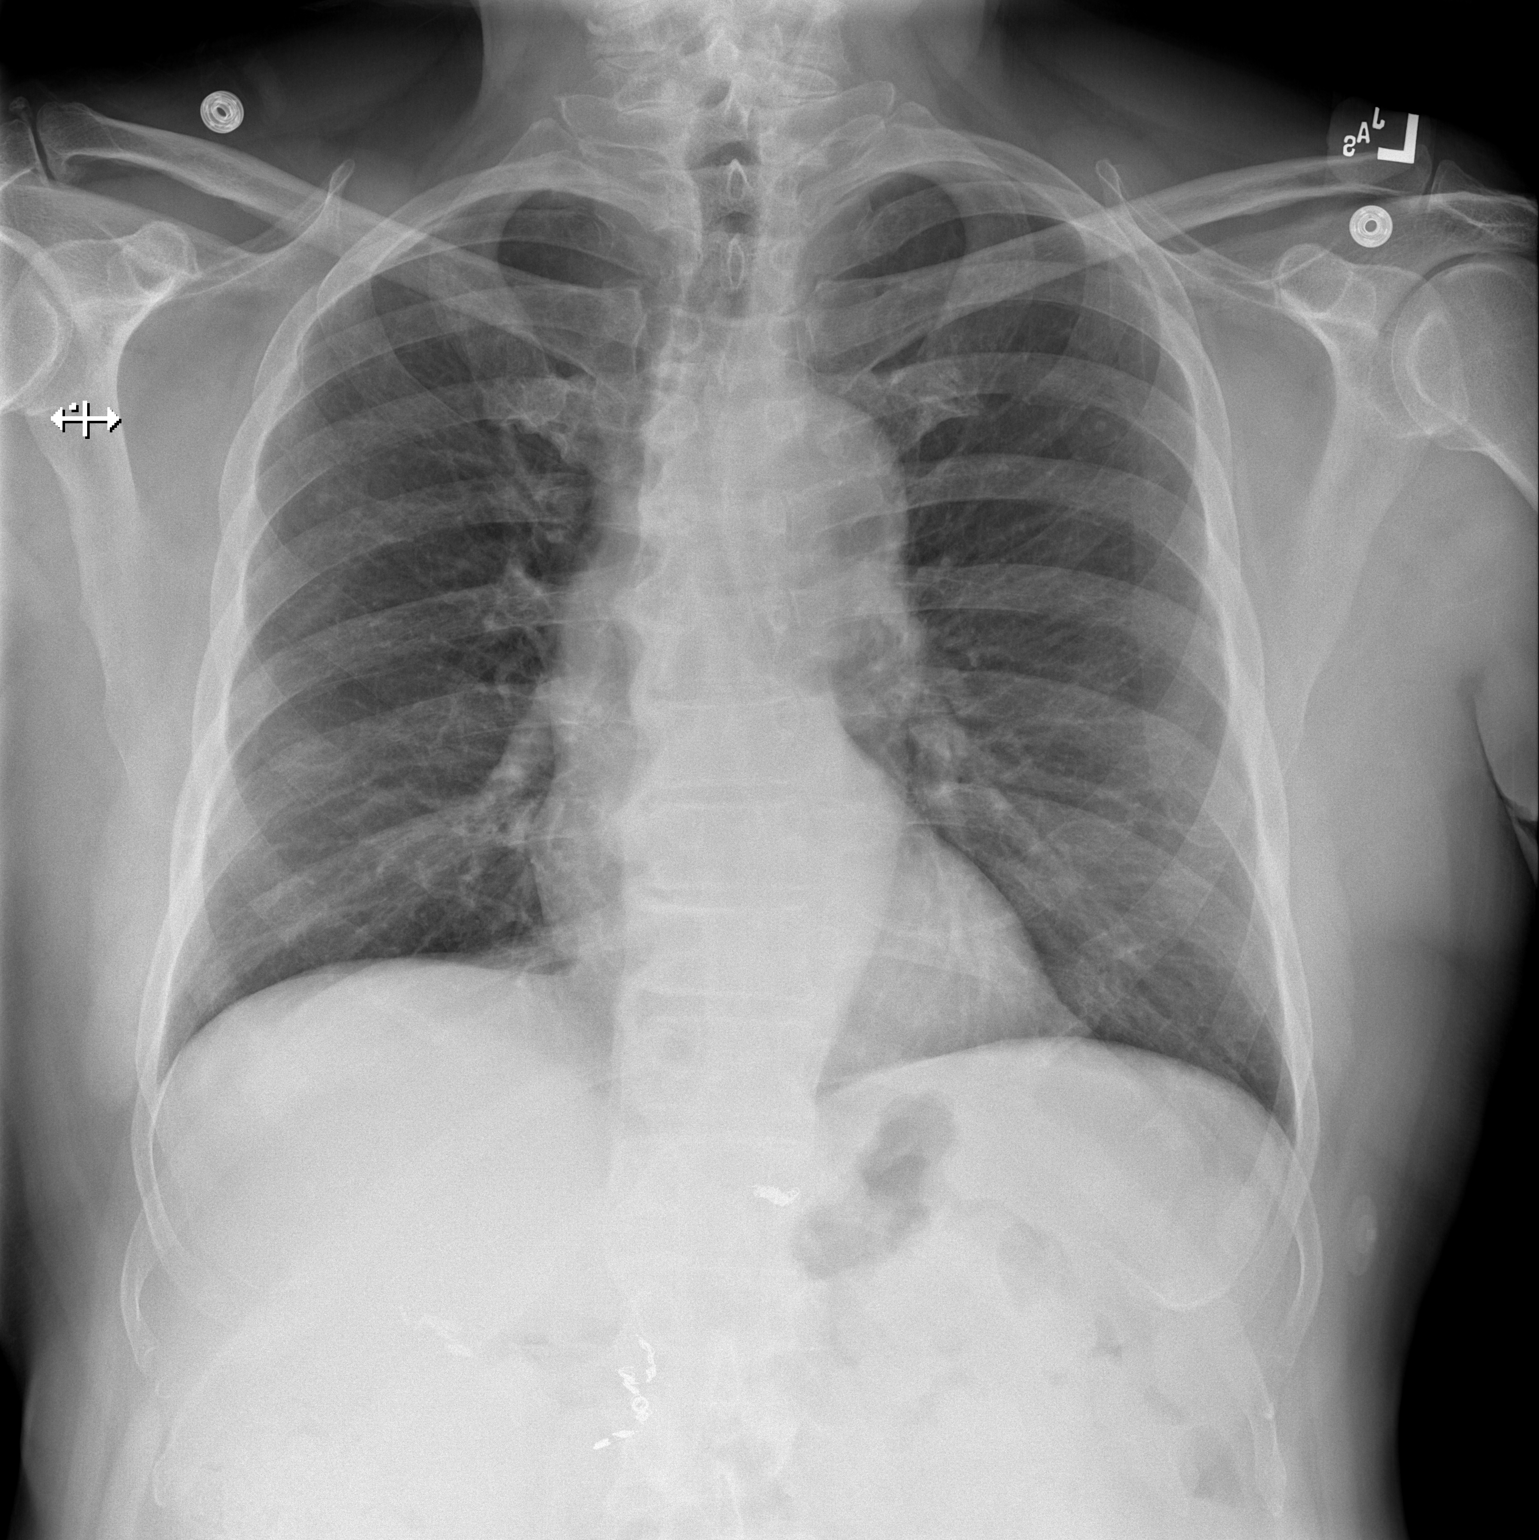

[w chest lat]
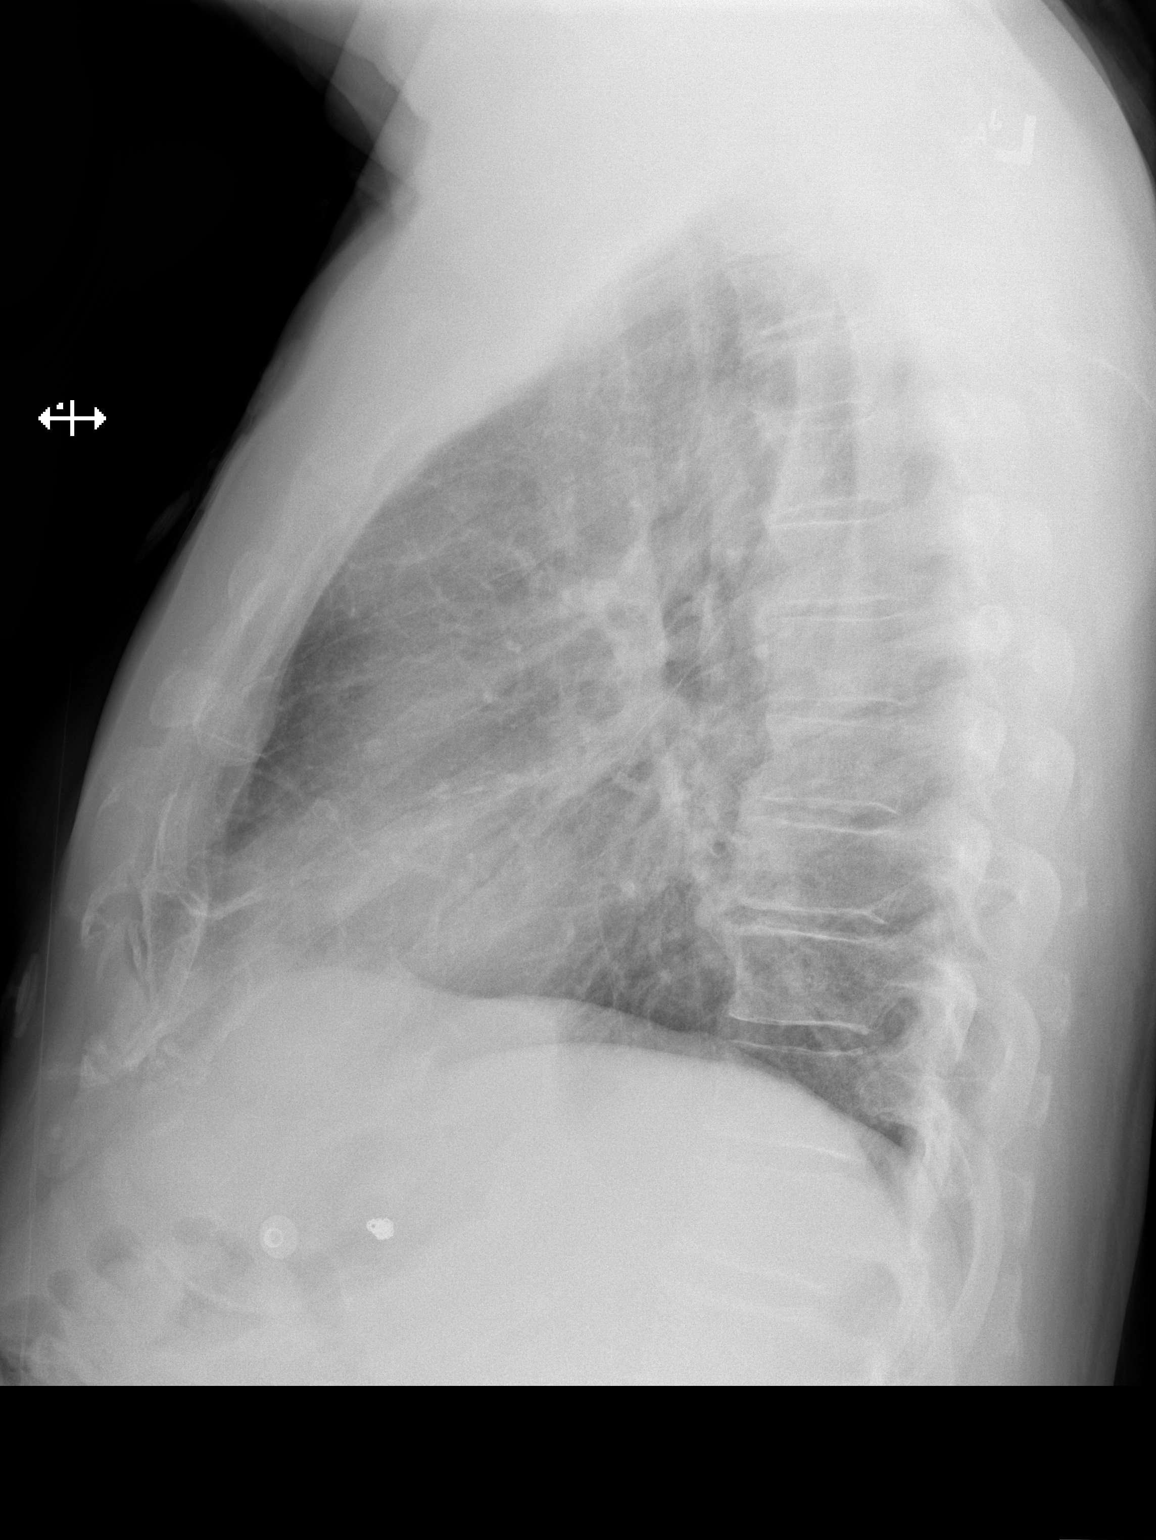

[2 of 2 positions shown; findings below may reference images not displayed]

FINDINGS: Heart size and pulmonary vascularity are normal and the lungs are
clear except for a calcified granuloma in the lingula. No effusions.
Slight tortuosity of the thoracic aorta. No bone abnormality.
IMPRESSION: No active cardiopulmonary disease.

## 2016-02-09 MED ORDER — PANCRELIPASE (LIP-PROT-AMYL) 12000-38000 UNITS PO CPEP
24000.0000 [IU] | ORAL_CAPSULE | Freq: Three times a day (TID) | ORAL | Status: DC
  Administered 2016-02-10 – 2016-02-11 (×2): 24000 [IU] via ORAL
  Filled 2016-02-09 (×3): qty 2

## 2016-02-09 MED ORDER — FLUTICASONE PROPIONATE 50 MCG/ACT NA SUSP: 2.0000 | Freq: Every day | NASAL | Status: DC | PRN

## 2016-02-09 MED ORDER — ASPIRIN 81 MG PO CHEW
81.0000 mg | CHEWABLE_TABLET | ORAL | Status: AC
  Administered 2016-02-10: 81 mg via ORAL
  Filled 2016-02-09: qty 1

## 2016-02-09 MED ORDER — ATORVASTATIN CALCIUM 10 MG PO TABS
10.0000 mg | ORAL_TABLET | Freq: Every day | ORAL | Status: DC
  Administered 2016-02-10: 10 mg via ORAL
  Filled 2016-02-09: qty 1

## 2016-02-09 MED ORDER — ASPIRIN EC 81 MG PO TBEC
81.0000 mg | DELAYED_RELEASE_TABLET | Freq: Every morning | ORAL | Status: DC
  Administered 2016-02-11: 81 mg via ORAL
  Filled 2016-02-09 (×2): qty 1

## 2016-02-09 MED ORDER — ASPIRIN 81 MG PO CHEW
324.0000 mg | CHEWABLE_TABLET | Freq: Once | ORAL | Status: AC
  Administered 2016-02-09: 243 mg via ORAL
  Filled 2016-02-09: qty 4

## 2016-02-09 MED ORDER — LEVOTHYROXINE SODIUM 50 MCG PO TABS
50.0000 ug | ORAL_TABLET | Freq: Every day | ORAL | Status: DC
  Administered 2016-02-10: 50 ug via ORAL
  Filled 2016-02-09: qty 1

## 2016-02-09 MED ORDER — ONDANSETRON HCL 4 MG/2ML IJ SOLN: 4.0000 mg | Freq: Three times a day (TID) | INTRAMUSCULAR | Status: DC | PRN

## 2016-02-09 MED ORDER — PAROXETINE HCL 20 MG PO TABS
10.0000 mg | ORAL_TABLET | Freq: Every day | ORAL | Status: DC
  Administered 2016-02-10: 10 mg via ORAL
  Filled 2016-02-09: qty 1

## 2016-02-09 MED ORDER — HEPARIN SODIUM (PORCINE) 5000 UNIT/ML IJ SOLN: 4000.0000 [IU] | Freq: Once | INTRAMUSCULAR | Status: DC

## 2016-02-09 MED ORDER — ACETAMINOPHEN 325 MG PO TABS: 650.0000 mg | ORAL_TABLET | ORAL | Status: DC | PRN

## 2016-02-09 MED ORDER — SODIUM CHLORIDE 0.9% FLUSH
3.0000 mL | INTRAVENOUS | Status: DC | PRN
Start: 2016-02-09 — End: 2016-02-11

## 2016-02-09 MED ORDER — HEPARIN BOLUS VIA INFUSION
4000.0000 [IU] | Freq: Once | INTRAVENOUS | Status: AC
  Administered 2016-02-09: 4000 [IU] via INTRAVENOUS
  Filled 2016-02-09: qty 4000

## 2016-02-09 MED ORDER — OMEGA-3-ACID ETHYL ESTERS 1 G PO CAPS
2.0000 g | ORAL_CAPSULE | Freq: Two times a day (BID) | ORAL | Status: DC
  Administered 2016-02-09 – 2016-02-10 (×3): 2 g via ORAL
  Filled 2016-02-09 (×3): qty 2

## 2016-02-09 MED ORDER — LOSARTAN POTASSIUM 50 MG PO TABS
100.0000 mg | ORAL_TABLET | Freq: Every morning | ORAL | Status: DC
Start: 2016-02-10 — End: 2016-02-11
  Administered 2016-02-10: 100 mg via ORAL
  Filled 2016-02-09: qty 2

## 2016-02-09 MED ORDER — METOCLOPRAMIDE HCL 10 MG PO TABS
10.0000 mg | ORAL_TABLET | Freq: Every day | ORAL | Status: DC
  Administered 2016-02-10: 10 mg via ORAL
  Filled 2016-02-09 (×2): qty 1

## 2016-02-09 MED ORDER — SALINE SPRAY 0.65 % NA SOLN: 1.0000 | NASAL | Status: DC | PRN

## 2016-02-09 MED ORDER — SODIUM CHLORIDE 0.9% FLUSH: 3.0000 mL | Freq: Two times a day (BID) | INTRAVENOUS | Status: DC

## 2016-02-09 MED ORDER — AMLODIPINE BESYLATE 5 MG PO TABS
5.0000 mg | ORAL_TABLET | Freq: Every day | ORAL | Status: DC
  Administered 2016-02-10: 5 mg via ORAL
  Filled 2016-02-09: qty 1

## 2016-02-09 MED ORDER — VITAMIN D 1000 UNITS PO TABS
1000.0000 [IU] | ORAL_TABLET | Freq: Every day | ORAL | Status: DC
  Administered 2016-02-10: 1000 [IU] via ORAL
  Filled 2016-02-09: qty 1

## 2016-02-09 MED ORDER — PANTOPRAZOLE SODIUM 20 MG PO TBEC
20.0000 mg | DELAYED_RELEASE_TABLET | Freq: Every day | ORAL | Status: DC
  Administered 2016-02-10: 20 mg via ORAL
  Filled 2016-02-09: qty 1

## 2016-02-09 MED ORDER — SODIUM CHLORIDE 0.9 % WEIGHT BASED INFUSION: 1.0000 mL/kg/h | INTRAVENOUS | Status: DC

## 2016-02-09 MED ORDER — METOPROLOL TARTRATE 25 MG PO TABS
25.0000 mg | ORAL_TABLET | Freq: Two times a day (BID) | ORAL | Status: DC
  Administered 2016-02-09 – 2016-02-11 (×3): 25 mg via ORAL
  Filled 2016-02-09 (×3): qty 1

## 2016-02-09 MED ORDER — DIPHENHYDRAMINE HCL 25 MG PO CAPS: 50.0000 mg | ORAL_CAPSULE | Freq: Once | ORAL | Status: DC | PRN

## 2016-02-09 MED ORDER — SODIUM CHLORIDE 0.9 % IV SOLN: 250.0000 mL | INTRAVENOUS | Status: DC | PRN

## 2016-02-09 MED ORDER — VITAMIN B-12 1000 MCG PO TABS
1000.0000 ug | ORAL_TABLET | Freq: Every day | ORAL | Status: DC
  Administered 2016-02-10: 1000 ug via ORAL
  Filled 2016-02-09 (×2): qty 1

## 2016-02-09 MED ORDER — LANREOTIDE ACETATE 120 MG/0.5ML ~~LOC~~ SOLN: 0.1200 mg | SUBCUTANEOUS | Status: DC

## 2016-02-09 MED ORDER — SUCRALFATE 1 G PO TABS: 1.0000 g | ORAL_TABLET | Freq: Three times a day (TID) | ORAL | Status: DC

## 2016-02-09 MED ORDER — NITROGLYCERIN 0.4 MG SL SUBL: 0.4000 mg | SUBLINGUAL_TABLET | SUBLINGUAL | Status: DC | PRN

## 2016-02-09 MED ORDER — SODIUM CHLORIDE 0.9% FLUSH: 3.0000 mL | INTRAVENOUS | Status: DC | PRN

## 2016-02-09 MED ORDER — GI COCKTAIL ~~LOC~~: 30.0000 mL | Freq: Once | ORAL | Status: DC

## 2016-02-09 MED ORDER — HEPARIN (PORCINE) IN NACL 100-0.45 UNIT/ML-% IJ SOLN
1250.0000 [IU]/h | INTRAMUSCULAR | Status: DC
  Administered 2016-02-09: 1000 [IU]/h via INTRAVENOUS
  Filled 2016-02-09 (×2): qty 250

## 2016-02-09 MED ORDER — SODIUM CHLORIDE 0.9 % WEIGHT BASED INFUSION
3.0000 mL/kg/h | INTRAVENOUS | Status: DC
  Administered 2016-02-10: 3 mL/kg/h via INTRAVENOUS

## 2016-02-09 MED ORDER — ONDANSETRON HCL 4 MG/2ML IJ SOLN: 4.0000 mg | Freq: Four times a day (QID) | INTRAMUSCULAR | Status: DC | PRN

## 2016-02-09 MED ORDER — SUCRALFATE 1 G PO TABS
1.0000 g | ORAL_TABLET | Freq: Four times a day (QID) | ORAL | Status: DC
  Administered 2016-02-09 – 2016-02-10 (×4): 1 g via ORAL
  Filled 2016-02-09 (×7): qty 1

## 2016-02-09 NOTE — ED Notes (Signed)
MD aware of pt's elevated troponin of 0.26

## 2016-02-09 NOTE — ED Notes (Addendum)
EDP at bedside  

## 2016-02-09 NOTE — ED Notes (Addendum)
Pt states since Friday he has been having intermittent center chest pain. Pt states," it is a numb feeling that comes and goes." Pt was sent here from his PCP for a trop. of 0.24 (H). Pt denies any pain at this time.

## 2016-02-09 NOTE — Progress Notes (Signed)
[place] at [facility] [address] [place], [place] [address] [phone]  Date:  02/09/2016   Name:  Brian Mays   DOB:  [birth year]   MRN:  [id number]  PCP:  Walker Kehr, MD    Chief Complaint: Heartburn   History of Present Illness:  Brian Mays is a 70 y.o. very pleasant male patient who presents with the following:  Medical history includes neuroendocrine cardinoma with mets to liver. He has generally done quite well with this however and does not have much in the way of symptoms  He has noted belching and acid reflux for the last 3 days or so. It got a little better today but he was miserable the last couple of days.   Epigastric pain is present now but improved from yesterday  He did have pancreatitis in 1999 with pseudocyst and had to be on a feeding tube for 4 months.  Also history of GERD off an on since then.    He will notice an epigastric burning sensation but admits it can be worse with exertion. He has noted some increased "heartburn" with dizziness while working on some home projects over the last few weeks  He saw Dr. Einar Gip about 18 months ago and had a normal stress per his report.    He has tried several things OTC for GERD over the last few days. He also tried milk of magnesia for constipation that did seem to help  His gallbladder and appendix have been removed  Patient Active Problem List   Diagnosis Date Noted  . Warts 09/08/2015  . Metastasis to liver ([place])   . Neuroendocrine cancer ([place])   . Neuroendocrine carcinoma metastatic to liver ([place])   . Vitamin D deficiency 03/11/2015  . Metastatic malignant neuroendocrine tumor to liver ([place]) 02/14/2015  . Sinusitis 12/08/2012  . Sinusitis acute 04/03/2012  . Hyperglycemia 04/03/2012  . Diarrhea 03/29/2012  . Eczema 09/30/2011  . B12 deficiency 04/19/2011  . Well adult exam 03/30/2011  . Keloid scar 10/13/2009  . TOBACCO USE, QUIT 10/13/2009   . Unspecified vitamin D deficiency 06/19/2009  . Neoplasm by body site 12/23/2008  . GASTROINTESTINAL XRAY, ABNORMAL 11/14/2008  . ABDOMINAL PAIN-EPIGASTRIC 11/11/2008  . DIVERTICULOSIS, COLON 11/08/2008  . ABDOMINAL PAIN 10/02/2008  . Rash and other nonspecific skin eruption 06/05/2008  . Hypothyroidism 12/13/2007  . HEMORRHOIDS, NOS 12/13/2007  . FLATULENCE 12/13/2007  . COLONIC POLYPS, HX OF 12/13/2007  . Essential hypertension 09/15/2007  . GERD 09/15/2007  . PANCREATITIS, CHRONIC 09/15/2007  . FATIGUE 09/15/2007  . CARDIAC MURMUR 09/15/2007  . SNORING 09/15/2007    Past Medical History  Diagnosis Date  . GERD (gastroesophageal reflux disease)   . Pancreatitis 1998    chronic  . HTN (hypertension)   . Hemorrhoid   . Hypothyroidism   . Colon polyps   . Metastatic carcinoma ([place]) 2010    Dr Jonette Eva  . Diverticulosis   . Gallstones   . Hiatal hernia   . Heart murmur   . Anemia     in past    Past Surgical History  Procedure Laterality Date  . Cholecystectomy  1999  . Carcinoil resection  01/2009  . Appendectomy  1964  . Liver biopsy      in 2010  . Tumor removed      from small intestine    Social History  Substance Use Topics  . Smoking status: Former Smoker --  1.50 packs/day for 35 years    Types: Cigarettes    Start date: 09/25/1959    Quit date: 11/01/1997  . Smokeless tobacco: Never Used     Comment: quit smoking 18 years ago  . Alcohol Use: 1.8 - 2.4 oz/week    3-4 Cans of beer per week     Comment: 4 beers weekly     Family History  Problem Relation Age of Onset  . Kidney disease Mother   . Hyperlipidemia Mother   . Hypertension Mother   . COPD Father   . Ulcerative colitis Daughter     Allergies  Allergen Reactions  . Iohexol      Code: HIVES, Desc: PER [name] @ [facility], PT IS ALLERGIC TO CONTRAST DYE 10/02/08/RM  05/01/10...needs full premeds per our protocol w/ [facility] imaging., Onset Date: [id number]     Medication list has been  reviewed and updated.  Current Outpatient Prescriptions on File Prior to Visit  Medication Sig Dispense Refill  . amLODipine (NORVASC) 5 MG tablet TAKE 1 TABLET DAILY 30 tablet 11  . aspirin EC 81 MG tablet Take 81 mg by mouth every morning.    . Cholecalciferol 1000 UNITS tablet Take 1,000 Units by mouth daily.      . diphenhydrAMINE (BENADRYL) 50 MG tablet Take 1 tablet (50 mg total) by mouth once. Take one hour before study 2 tablet 0  . fluticasone (FLONASE) 50 MCG/ACT nasal spray Place 2 sprays into both nostrils daily as needed (sinuses).   11  . hydrocortisone (ANUSOL-HC) 25 MG suppository INSERT 1 SUPPOSITORY RECTALLY TWICE A DAY AS NEEDED FOR HEMORRHOIDS 30 suppository 0  . Lanreotide Acetate (SOMATULINE DEPOT [place]) Inject 120 mcg into the skin every 28 (twenty-eight) days. Receives at Dr Antonieta Pert office    . lansoprazole (PREVACID) 30 MG capsule Take 1 capsule (30 mg total) by mouth daily at 12 noon. 90 capsule 2  . levothyroxine (SYNTHROID, LEVOTHROID) 50 MCG tablet TAKE 1 TABLET DAILY. 90 tablet 2  . losartan (COZAAR) 100 MG tablet TAKE 1 TABLET BY MOUTH EVERY MORNING 90 tablet 3  . metoCLOPramide (REGLAN) 10 MG tablet Take 10 mg by mouth daily.    . Omega-3 Fatty Acids (FISH OIL) 1000 MG CAPS Take 1 capsule by mouth daily. Reported on 12/30/2015    . PARoxetine (PAXIL) 10 MG tablet Take 1 tablet (10 mg total) by mouth daily. 90 tablet 3  . predniSONE (DELTASONE) 50 MG tablet Take 50mg  at 13 hours, 7 hours, and 1 hour before scan 3 tablet 0  . sodium chloride (OCEAN) 0.65 % SOLN nasal spray Place 1 spray into both nostrils as needed for congestion.     . vitamin B-12 (CYANOCOBALAMIN) 1000 MCG tablet Take 1,000 mcg by mouth daily.     No current facility-administered medications on file prior to visit.    Review of Systems:  As per HPI- otherwise negative.   Physical Examination: Filed Vitals:   02/09/16 1120  BP: 142/80  Pulse: 74  Temp: 98.3 F (36.8 C)   Filed Vitals:    02/09/16 1120  Height: 5' 10.5" (1.791 m)  Weight: 203 lb 3.2 oz (92.171 kg)   Body mass index is 28.73 kg/(m^2). Ideal Body Weight: Weight in (lb) to have BMI = 25: 176.4  GEN: WDWN, NAD, Non-toxic, A & O x 3, overweight, looks well HEENT: Atraumatic, Normocephalic. Neck supple. No masses, No LAD. Ears and Nose: No external deformity. CV: RRR, No M/G/R. No JVD. No  thrill. No extra heart sounds. PULM: CTA B, no wheezes, crackles, rhonchi. No retractions. No resp. distress. No accessory muscle use. ABD: S, ND, +BS. No rebound. No HSM.  He indicates the epigastrum as the area of tenderness.  He has mild tenderness in the RUQ, LUQ and more tenderness in the epigastric area EXTR: No c/c/e NEURO Normal gait.  PSYCH: Normally interactive. Conversant. Not depressed or anxious appearing.  Calm demeanor.   GI cocktail- helped to resolve his sx. He felt that his pain was completely gone EKG: normal    Results for orders placed or performed in visit on 02/09/16  CBC  Result Value Ref Range   WBC 9.3 4.0 - 10.5 K/uL   RBC 4.91 4.22 - 5.81 Mil/uL   Platelets 202.0 150.0 - 400.0 K/uL   Hemoglobin 13.5 13.0 - 17.0 g/dL   HCT 40.6 39.0 - 52.0 %   MCV 82.8 78.0 - 100.0 fl   MCHC 33.2 30.0 - 36.0 g/dL   RDW 15.3 11.5 - 15.5 %  Comprehensive metabolic panel  Result Value Ref Range   Sodium 137 135 - 145 mEq/L   Potassium 4.1 3.5 - 5.1 mEq/L   Chloride 100 96 - 112 mEq/L   CO2 30 19 - 32 mEq/L   Glucose, Bld 88 70 - 99 mg/dL   BUN 20 6 - 23 mg/dL   Creatinine, Ser 0.80 0.40 - 1.50 mg/dL   Total Bilirubin 0.5 0.2 - 1.2 mg/dL   Alkaline Phosphatase 62 39 - 117 U/L   AST 26 0 - 37 U/L   ALT 15 0 - 53 U/L   Total Protein 8.1 6.0 - 8.3 g/dL   Albumin 3.9 3.5 - 5.2 g/dL   Calcium 9.5 8.4 - 10.5 mg/dL   GFR 101.88 >60.00 mL/min  Lipase  Result Value Ref Range   Lipase 15.0 11.0 - 59.0 U/L  Amylase  Result Value Ref Range   Amylase 23 (L) 27 - 131 U/L  Troponin I  Result Value Ref  Range   TNIDX 0.24 (H) 0.00 - 0.06 ug/l   Assessment and Plan: Heartburn - Plan: EKG 12-Lead, gi cocktail (Maalox,Lidocaine,Donnatal), sucralfate (CARAFATE) 1 g tablet, CBC, Comprehensive metabolic panel, H. pylori breath test  Chest pain, unspecified chest pain type - Plan: EKG 12-Lead, Troponin I  History of pancreatitis - Plan: Lipase, Amylase  Here today with heartburn type pain. Resolved with GI cocktail which is reassuring but he does endorse worse pain with exertion over the last few weeks so will check a troponin for him rx for carafate and H pylori breath test pending Will also look for any evidence of recurrent pancreatitis Pt went home- received his troponin at 5:25 and noted it was high. Called pt and instructed to go to [facility] ER right away and he understands, plans to do so. Alerted EDP as well   Signed Lamar Blinks, MD

## 2016-02-09 NOTE — Progress Notes (Signed)
ANTICOAGULATION CONSULT NOTE - Initial Consult  Pharmacy Consult for Heparin Indication: chest pain/ACS  Allergies  Allergen Reactions  . Iohexol Hives     Code: HIVES, Desc: PER [name] @ [facility], PT IS ALLERGIC TO CONTRAST DYE 10/02/08/RM  05/01/10...needs full premeds per our protocol w/ [facility] imaging., Onset Date: [id number]     Patient Measurements: Height: 5\' 6"  (167.6 cm) Weight: 203 lb (92.08 kg) IBW/kg (Calculated) : 63.8 Heparin Dosing Weight: 83.4 kg  Vital Signs: Temp: 98 F (36.7 C) (04/10 1812) Temp Source: Oral (04/10 1812) BP: 131/84 mmHg (04/10 1900) Pulse Rate: 72 (04/10 1900)  Labs:  Recent Labs  02/09/16 1203 02/09/16 1842  HGB 13.5 13.3  HCT 40.6 41.5  PLT 202.0 191  CREATININE 0.80 0.95    Estimated Creatinine Clearance: 79.1 mL/min (by C-G formula based on Cr of 0.95).   Medical History: Past Medical History  Diagnosis Date  . GERD (gastroesophageal reflux disease)   . Pancreatitis 1998    chronic  . HTN (hypertension)   . Hemorrhoid   . Hypothyroidism   . Colon polyps   . Metastatic carcinoma ([place]) 2010    Dr Jonette Eva  . Diverticulosis   . Gallstones   . Hiatal hernia   . Heart murmur   . Anemia     in past    Assessment: 45-yo male presents to ED with chest pain for 3 days, which patient attributed to acid reflux.  Pain worsened with exertion and subsided with rest.  PMH includes HTN, GERD, chronic pancreatitis, cancer.  Pharmacy consulted to start heparin in the setting of ACS/chest pain.  Hgb 13.3, PLT 191, SCr 0.95 (CrCl 79.1), Troponin 0.26 No PTA oral anticoagulation; ASA-81 only  Goal of Therapy:  Heparin level 0.3-0.7 units/ml Monitor platelets by anticoagulation protocol: Yes   Plan:  Give 4000 units bolus x 1 Start heparin infusion at 1000 units/hr Check anti-Xa level in 6 hours and daily while on heparin Continue to monitor H&H and platelets  Viann Fish 02/09/2016,7:23 PM

## 2016-02-09 NOTE — ED Notes (Signed)
MD at bedside. 

## 2016-02-09 NOTE — ED Provider Notes (Signed)
CSN: [id number]     Arrival date & time 02/09/16  1804 History   First MD Initiated Contact with Patient 02/09/16 1850     Chief Complaint  Patient presents with  . Chest Pain     (Consider location/radiation/quality/duration/timing/severity/associated sxs/prior Treatment) HPI   Brian Mays is a 70 y.o. male, with a history of Hypertension, GERD, and cancer, presenting to the ED with chest pain for the last three days. Pt states he attributed the pain to acid reflux, of which he has a history. Pain intensifies with exertion and lifting, subsides with rest. Pt also endorses belching. Pt states he has multiple previous similar episodes. Pt currently endorses some lower chest discomfort, but states it is too low to rate as pain. Previous smoker, last smoked 19 years ago. Denies history of heart problems. Pt has been previously assessed by Dr. Einar Gip. Pt was evaluated by Dr. Einar Gip because he was diagnosed with HTN. Patient denies nausea/vomiting, shortness of breath, dizziness, recent illness, or any other complaints. Patient was evaluated for this pain by his PCP, Dr. Lorelei Pont, who initially treated the patient with a GI cocktail. Patient improved with this treatment. Troponin returned positive after patient had already gone home. Patient was called and instructed to go to the [facility] ED.     Past Medical History  Diagnosis Date  . GERD (gastroesophageal reflux disease)   . Pancreatitis 1998    chronic  . HTN (hypertension)   . Hemorrhoid   . Hypothyroidism   . Colon polyps   . Metastatic carcinoma ([place]) 2010    Dr Jonette Eva  . Diverticulosis   . Gallstones   . Hiatal hernia   . Heart murmur   . Anemia     in past   Past Surgical History  Procedure Laterality Date  . Cholecystectomy  1999  . Carcinoil resection  01/2009  . Appendectomy  1964  . Liver biopsy      in 2010  . Tumor removed      from small intestine   Family History  Problem Relation Age of Onset  . Kidney  disease Mother   . Hyperlipidemia Mother   . Hypertension Mother   . COPD Father   . Ulcerative colitis Daughter    Social History  Substance Use Topics  . Smoking status: Former Smoker -- 1.50 packs/day for 35 years    Types: Cigarettes    Start date: 09/25/1959    Quit date: 11/01/1997  . Smokeless tobacco: Never Used     Comment: quit smoking 18 years ago  . Alcohol Use: 1.8 - 2.4 oz/week    3-4 Cans of beer per week     Comment: 4 beers weekly     Review of Systems  Constitutional: Negative for fever, chills, diaphoresis and unexpected weight change.  Respiratory: Negative for cough, chest tightness and shortness of breath.   Cardiovascular: Positive for chest pain. Negative for palpitations and leg swelling.  Gastrointestinal: Negative for nausea, vomiting, abdominal pain, diarrhea and constipation.  Genitourinary: Negative for dysuria and flank pain.  Musculoskeletal: Negative for back pain.  Skin: Negative for color change and pallor.  Neurological: Negative for dizziness, syncope, weakness and light-headedness.  All other systems reviewed and are negative.     Allergies  Iohexol  Home Medications   Prior to Admission medications   Medication Sig Start Date End Date Taking? Authorizing Provider  amLODipine (NORVASC) 5 MG tablet TAKE 1 TABLET DAILY 05/30/15  Yes Aleksei Plotnikov  V, MD  aspirin EC 81 MG tablet Take 81 mg by mouth every morning.   Yes Historical Provider, MD  Cholecalciferol 1000 UNITS tablet Take 1,000 Units by mouth daily.     Yes Historical Provider, MD  CREON 24000 units CPEP Take 24,000 Units by mouth 3 (three) times daily with meals.  02/05/16  Yes Historical Provider, MD  diphenhydrAMINE (BENADRYL) 50 MG tablet Take 1 tablet (50 mg total) by mouth once. Take one hour before study 12/15/15  Yes Volanda Napoleon, MD  fluticasone [employer]) 50 MCG/ACT nasal spray Place 2 sprays into both nostrils daily as needed (sinuses).  01/06/15  Yes Historical  Provider, MD  hydrocortisone (ANUSOL-HC) 25 MG suppository INSERT 1 SUPPOSITORY RECTALLY TWICE A DAY AS NEEDED FOR HEMORRHOIDS 02/07/15  Yes [place], NP  Lanreotide Acetate (SOMATULINE DEPOT [place]) Inject 120 mcg into the skin every 28 (twenty-eight) days. Receives at Dr Antonieta Pert office   Yes Historical Provider, MD  lansoprazole (PREVACID) 30 MG capsule Take 1 capsule (30 mg total) by mouth daily at 12 noon. 11/27/15  Yes Aleksei Plotnikov V, MD  levothyroxine (SYNTHROID, LEVOTHROID) 50 MCG tablet TAKE 1 TABLET DAILY. 07/30/15  Yes Aleksei Plotnikov V, MD  losartan (COZAAR) 100 MG tablet TAKE 1 TABLET BY MOUTH EVERY MORNING 03/14/15  Yes Volanda Napoleon, MD  metoCLOPramide (REGLAN) 10 MG tablet Take 10 mg by mouth daily.   Yes Historical Provider, MD  Omega-3 Fatty Acids (FISH OIL) 1000 MG CAPS Take 1 capsule by mouth daily. Reported on 12/30/2015   Yes Historical Provider, MD  PARoxetine (PAXIL) 10 MG tablet Take 1 tablet (10 mg total) by mouth daily. 02/05/16  Yes Volanda Napoleon, MD  predniSONE (DELTASONE) 50 MG tablet Take 50mg  at 13 hours, 7 hours, and 1 hour before scan 12/15/15  Yes Volanda Napoleon, MD  sodium chloride (OCEAN) 0.65 % SOLN nasal spray Place 1 spray into both nostrils as needed for congestion.    Yes Historical Provider, MD  sucralfate (CARAFATE) 1 g tablet Take 1 tablet (1 g total) by mouth 4 (four) times daily -  with meals and at bedtime. 02/09/16  Yes Jessica C Copland, MD  sucralfate (CARAFATE) 1 g tablet Take 1 g by mouth 4 (four) times daily.   Yes Historical Provider, MD  vitamin B-12 (CYANOCOBALAMIN) 1000 MCG tablet Take 1,000 mcg by mouth daily.   Yes Historical Provider, MD   BP 131/84 mmHg  Pulse 72  Temp(Src) 98 F (36.7 C) (Oral)  Resp 15  Ht 5\' 6"  (1.676 m)  Wt 92.08 kg  BMI 32.78 kg/m2  SpO2 96% Physical Exam  Constitutional: He appears well-developed and well-nourished. No distress.  HENT:  Head: Normocephalic and atraumatic.  Eyes: Conjunctivae are  normal. Pupils are equal, round, and reactive to light.  Neck: Neck supple.  Cardiovascular: Normal rate, regular rhythm, normal heart sounds and intact distal pulses.   Pulmonary/Chest: Effort normal and breath sounds normal. No respiratory distress.  Abdominal: Soft. There is no tenderness. There is no guarding.  Musculoskeletal: He exhibits no edema or tenderness.  Lymphadenopathy:    He has no cervical adenopathy.  Neurological: He is alert.  Skin: Skin is warm and dry. He is not diaphoretic.  Psychiatric: He has a normal mood and affect. His behavior is normal.  Nursing note and vitals reviewed.   ED Course  Procedures (including critical care time)  CRITICAL CARE Performed by: Aldridge Krzyzanowski C Jaicob Dia Total critical care time: 30 minutes  Critical care time was exclusive of separately billable procedures and treating other patients. Critical care was necessary to treat or prevent imminent or life-threatening deterioration. Critical care was time spent personally by me on the following activities: development of treatment plan with patient and/or surrogate as well as nursing, discussions with consultants, evaluation of patient's response to treatment, examination of patient, obtaining history from patient or surrogate, ordering and performing treatments and interventions, ordering and review of laboratory studies, ordering and review of radiographic studies, pulse oximetry and re-evaluation of patient's condition.   Labs Review Labs Reviewed  BASIC METABOLIC PANEL - Abnormal; Notable for the following:    BUN 23 (*)    All other components within normal limits  I-STAT TROPOININ, ED - Abnormal; Notable for the following:    Troponin i, poc 0.26 (*)    All other components within normal limits  CBC  APTT  PROTIME-INR  BRAIN NATRIURETIC PEPTIDE  URINALYSIS, ROUTINE W REFLEX MICROSCOPIC (NOT AT [facility])  HEPARIN LEVEL (UNFRACTIONATED)  CBC    Imaging Review Dg Chest 2 View  02/09/2016   CLINICAL DATA:  Mid chest pain and shortness of breath. EXAM: CHEST  2 VIEW COMPARISON:  CT scan of the chest dated 12/19/2015 FINDINGS: Heart size and pulmonary vascularity are normal and the lungs are clear except for a calcified granuloma in the lingula. No effusions. Slight tortuosity of the thoracic aorta. No bone abnormality. IMPRESSION: No active cardiopulmonary disease. Electronically Signed   By: Lorriane Shire M.D.   On: 02/09/2016 19:39   I have personally reviewed and evaluated these images and lab results as part of my medical decision-making.   EKG Interpretation   Date/Time:  Monday February 09 2016 18:10:40 EDT Ventricular Rate:  74 PR Interval:  194 QRS Duration: 80 QT Interval:  388 QTC Calculation: 430 R Axis:   11 Text Interpretation:  Normal sinus rhythm Normal ECG No significant change  since last tracing Confirmed by KNOTT MD, DANIEL [phone]) on 02/09/2016  6:57:58 PM       Medications  heparin bolus via infusion 4,000 Units (not administered)  heparin ADULT infusion 100 units/mL (25000 units/250 mL) (not administered)  aspirin chewable tablet 324 mg (243 mg Oral Given 02/09/16 1900)    MDM   Final diagnoses:  Chest pain, unspecified chest pain type  Unstable angina ([place])    Brian Mays presents with intermittent chest pain for the last 3 days.  Findings and plan of care discussed with Leo Grosser, MD. Dr. Laneta Simmers personally evaluated and examined this patient.  Cardiology consult due to the positive troponin without EKG changes. Suspect unstable angina. ACS order set utilized, Including orders for aspirin and heparin. HEART score is 7, indicating high risk for a cardiac event. Wells criteria score is 1, indicating low risk for PE.  7:24 PM Spoke with Cardiologist on call, who recommended calling Dr. Einar Gip since pt has seen him before. Dr. Irven Shelling office called and page placed with answering service.  7:29 PM Spoke with Dr. Einar Gip, who agreed to admit the  patient to telemetry for unstable angina. No further abnormalities found on the patient's labs or imaging.   Filed Vitals:   02/09/16 1812 02/09/16 1900  BP: 117/73 131/84  Pulse: 76 72  Temp: 98 F (36.7 C)   TempSrc: Oral   Resp: 16 15  Height: 5\' 6"  (1.676 m)   Weight: 92.08 kg   SpO2: 100% 96%       Lorayne Bender, PA-C  02/09/16 2031  Lorayne Bender, PA-C 02/09/16 2039  Leo Grosser, MD 02/10/16 [phone]

## 2016-02-09 NOTE — Progress Notes (Signed)
Pre visit review using our clinic review tool, if applicable. No additional management support is needed unless otherwise documented below in the visit note. 

## 2016-02-09 NOTE — Patient Instructions (Signed)
Add the carafate to your regiment (take it with meals and at bedtime for 10 days)- continue the prilosec I will be in touch with your labs Continue a conservative diet until you are feeling better Let me know if your symptoms do not go away!

## 2016-02-10 ENCOUNTER — Encounter (HOSPITAL_COMMUNITY)
Admission: EM | Disposition: A | Discharge status: Home / Self Care | Payer: Self-pay | Source: Home / Self Care | Attending: Cardiology

## 2016-02-10 HISTORY — PX: CARDIAC CATHETERIZATION: SHX172

## 2016-02-10 LAB — CBC
HCT: 40.1 % (ref 39.0–52.0)
Hemoglobin: 12.5 g/dL — ABNORMAL LOW (ref 13.0–17.0)
MCH: 26.8 pg (ref 26.0–34.0)
MCHC: 31.2 g/dL (ref 30.0–36.0)
MCV: 86.1 fL (ref 78.0–100.0)
PLATELETS: 171 10*3/uL (ref 150–400)
RBC: 4.66 MIL/uL (ref 4.22–5.81)
RDW: 14.8 % (ref 11.5–15.5)
WBC: 6.9 10*3/uL (ref 4.0–10.5)

## 2016-02-10 LAB — TROPONIN I
TROPONIN I: 0.21 ng/mL — AB (ref <0.031)
Troponin I: 0.25 ng/mL — ABNORMAL HIGH (ref <0.031)

## 2016-02-10 LAB — URINALYSIS, ROUTINE W REFLEX MICROSCOPIC
BILIRUBIN URINE: NEGATIVE
Glucose, UA: NEGATIVE mg/dL
HGB URINE DIPSTICK: NEGATIVE
KETONES UR: NEGATIVE mg/dL
Leukocytes, UA: NEGATIVE
NITRITE: NEGATIVE
PROTEIN: NEGATIVE mg/dL
Specific Gravity, Urine: 1.015 (ref 1.005–1.030)
pH: 6.5 (ref 5.0–8.0)

## 2016-02-10 LAB — HEPARIN LEVEL (UNFRACTIONATED)
HEPARIN UNFRACTIONATED: 0.26 [IU]/mL — AB (ref 0.30–0.70)
Heparin Unfractionated: 0.45 IU/mL (ref 0.30–0.70)

## 2016-02-10 LAB — H. PYLORI BREATH TEST: H. PYLORI BREATH TEST: NOT DETECTED

## 2016-02-10 LAB — POCT ACTIVATED CLOTTING TIME: ACTIVATED CLOTTING TIME: 456 s

## 2016-02-10 SURGERY — LEFT HEART CATH AND CORONARY ANGIOGRAPHY

## 2016-02-10 MED ORDER — SODIUM CHLORIDE 0.9 % IV SOLN
250.0000 mg | INTRAVENOUS | Status: DC | PRN
  Administered 2016-02-10: 1.75 mg/kg/h via INTRAVENOUS

## 2016-02-10 MED ORDER — DIPHENHYDRAMINE HCL 50 MG/ML IJ SOLN
25.0000 mg | Freq: Once | INTRAMUSCULAR | Status: AC
  Administered 2016-02-10: 25 mg via INTRAVENOUS
  Filled 2016-02-10: qty 1

## 2016-02-10 MED ORDER — SODIUM CHLORIDE 0.9 % IV SOLN: 250.0000 mL | INTRAVENOUS | Status: DC | PRN

## 2016-02-10 MED ORDER — ACETAMINOPHEN 325 MG PO TABS: 650.0000 mg | ORAL_TABLET | ORAL | Status: DC | PRN

## 2016-02-10 MED ORDER — NITROGLYCERIN 1 MG/10 ML FOR IR/CATH LAB
INTRA_ARTERIAL | Status: DC | PRN
  Administered 2016-02-10: 200 ug via INTRA_ARTERIAL

## 2016-02-10 MED ORDER — ATROPINE SULFATE 0.1 MG/ML IJ SOLN
INTRAMUSCULAR | Status: DC | PRN
  Administered 2016-02-10: 0.5 mg via INTRAVENOUS

## 2016-02-10 MED ORDER — SODIUM CHLORIDE 0.9% FLUSH: 3.0000 mL | INTRAVENOUS | Status: DC | PRN

## 2016-02-10 MED ORDER — BIVALIRUDIN 250 MG IV SOLR
INTRAVENOUS | Status: AC
  Filled 2016-02-10: qty 250

## 2016-02-10 MED ORDER — LIDOCAINE HCL (PF) 1 % IJ SOLN: INTRAMUSCULAR | Status: DC | PRN

## 2016-02-10 MED ORDER — TICAGRELOR 90 MG PO TABS
ORAL_TABLET | ORAL | Status: AC
Start: 2016-02-10 — End: 2016-02-10
  Filled 2016-02-10: qty 1

## 2016-02-10 MED ORDER — TICAGRELOR 90 MG PO TABS
ORAL_TABLET | ORAL | Status: AC
  Filled 2016-02-10: qty 1

## 2016-02-10 MED ORDER — ATROPINE SULFATE 0.1 MG/ML IJ SOLN
INTRAMUSCULAR | Status: AC
  Filled 2016-02-10: qty 10

## 2016-02-10 MED ORDER — LIDOCAINE HCL (PF) 1 % IJ SOLN
INTRAMUSCULAR | Status: DC | PRN
  Administered 2016-02-10: 2 mL

## 2016-02-10 MED ORDER — NITROGLYCERIN IN D5W 200-5 MCG/ML-% IV SOLN
INTRAVENOUS | Status: AC
  Filled 2016-02-10: qty 250

## 2016-02-10 MED ORDER — FENTANYL CITRATE (PF) 100 MCG/2ML IJ SOLN
INTRAMUSCULAR | Status: AC
  Filled 2016-02-10: qty 2

## 2016-02-10 MED ORDER — HEPARIN (PORCINE) IN NACL 2-0.9 UNIT/ML-% IJ SOLN
INTRAMUSCULAR | Status: DC | PRN
  Administered 2016-02-10: 1000 mL via INTRA_ARTERIAL

## 2016-02-10 MED ORDER — ANGIOPLASTY BOOK
Freq: Once | Status: AC
  Administered 2016-02-10: 20:00:00
  Filled 2016-02-10: qty 1

## 2016-02-10 MED ORDER — MIDAZOLAM HCL 2 MG/2ML IJ SOLN
INTRAMUSCULAR | Status: DC | PRN
  Administered 2016-02-10: 1 mg via INTRAVENOUS

## 2016-02-10 MED ORDER — FENTANYL CITRATE (PF) 100 MCG/2ML IJ SOLN
INTRAMUSCULAR | Status: DC | PRN
  Administered 2016-02-10: 50 ug via INTRAVENOUS
  Administered 2016-02-10: 25 ug via INTRAVENOUS

## 2016-02-10 MED ORDER — VIPERSLIDE LUBRICANT OPTIME
TOPICAL | Status: DC | PRN
  Administered 2016-02-10: 15:00:00 via SURGICAL_CAVITY

## 2016-02-10 MED ORDER — HEPARIN SODIUM (PORCINE) 1000 UNIT/ML IJ SOLN
INTRAMUSCULAR | Status: AC
  Filled 2016-02-10: qty 1

## 2016-02-10 MED ORDER — HEPARIN SODIUM (PORCINE) 1000 UNIT/ML IJ SOLN
INTRAMUSCULAR | Status: DC | PRN
  Administered 2016-02-10: 6000 [IU] via INTRAVENOUS

## 2016-02-10 MED ORDER — NITROGLYCERIN 1 MG/10 ML FOR IR/CATH LAB
INTRA_ARTERIAL | Status: AC
  Filled 2016-02-10: qty 10

## 2016-02-10 MED ORDER — IOPAMIDOL (ISOVUE-370) INJECTION 76%
INTRAVENOUS | Status: AC
  Filled 2016-02-10: qty 100

## 2016-02-10 MED ORDER — HEART ATTACK BOUNCING BOOK
Freq: Once | Status: AC
  Administered 2016-02-10: 20:00:00
  Filled 2016-02-10: qty 1

## 2016-02-10 MED ORDER — HEPARIN (PORCINE) IN NACL 2-0.9 UNIT/ML-% IJ SOLN
INTRAMUSCULAR | Status: AC
  Filled 2016-02-10: qty 1000

## 2016-02-10 MED ORDER — BIVALIRUDIN BOLUS VIA INFUSION - CUPID
INTRAVENOUS | Status: DC | PRN
  Administered 2016-02-10: 67.725 mg via INTRAVENOUS

## 2016-02-10 MED ORDER — SODIUM CHLORIDE 0.9% FLUSH: 3.0000 mL | Freq: Two times a day (BID) | INTRAVENOUS | Status: DC

## 2016-02-10 MED ORDER — VERAPAMIL HCL 2.5 MG/ML IV SOLN
INTRA_ARTERIAL | Status: DC | PRN
  Administered 2016-02-10: 5 mL via INTRA_ARTERIAL

## 2016-02-10 MED ORDER — HYDROMORPHONE HCL 1 MG/ML IJ SOLN
INTRAMUSCULAR | Status: DC | PRN
  Administered 2016-02-10: 0.5 mg via INTRAVENOUS

## 2016-02-10 MED ORDER — TICAGRELOR 90 MG PO TABS
ORAL_TABLET | ORAL | Status: DC | PRN
  Administered 2016-02-10: 180 mg via ORAL

## 2016-02-10 MED ORDER — FAMOTIDINE IN NACL 20-0.9 MG/50ML-% IV SOLN
20.0000 mg | Freq: Once | INTRAVENOUS | Status: AC
  Administered 2016-02-10: 20 mg via INTRAVENOUS
  Filled 2016-02-10: qty 50

## 2016-02-10 MED ORDER — SODIUM CHLORIDE 0.9 % WEIGHT BASED INFUSION
3.0000 mL/kg/h | INTRAVENOUS | Status: AC
  Administered 2016-02-10: 17:00:00 3 mL/kg/h via INTRAVENOUS

## 2016-02-10 MED ORDER — LIDOCAINE HCL (PF) 1 % IJ SOLN
INTRAMUSCULAR | Status: AC
  Filled 2016-02-10: qty 30

## 2016-02-10 MED ORDER — MIDAZOLAM HCL 2 MG/2ML IJ SOLN
INTRAMUSCULAR | Status: AC
  Filled 2016-02-10: qty 2

## 2016-02-10 MED ORDER — HYDROMORPHONE HCL 1 MG/ML IJ SOLN
INTRAMUSCULAR | Status: AC
  Filled 2016-02-10: qty 1

## 2016-02-10 MED ORDER — VERAPAMIL HCL 2.5 MG/ML IV SOLN
INTRAVENOUS | Status: AC
  Filled 2016-02-10: qty 2

## 2016-02-10 MED ORDER — TICAGRELOR 90 MG PO TABS
90.0000 mg | ORAL_TABLET | Freq: Two times a day (BID) | ORAL | Status: DC
  Administered 2016-02-11 (×2): 90 mg via ORAL
  Filled 2016-02-10 (×2): qty 1

## 2016-02-10 MED ORDER — METHYLPREDNISOLONE SODIUM SUCC 125 MG IJ SOLR
125.0000 mg | Freq: Once | INTRAMUSCULAR | Status: AC
  Administered 2016-02-10: 125 mg via INTRAVENOUS
  Filled 2016-02-10: qty 2

## 2016-02-10 MED ORDER — IOPAMIDOL (ISOVUE-370) INJECTION 76%
INTRAVENOUS | Status: DC | PRN
  Administered 2016-02-10: 170 mL via INTRA_ARTERIAL

## 2016-02-10 SURGICAL SUPPLY — 25 items
BALLN EMERGE MR 2.5X15 (BALLOONS) ×3
BALLN EMERGE MR 3.0X12 (BALLOONS) ×3
BALLN ~~LOC~~ EMERGE MR 3.75X15 (BALLOONS) ×3
BALLOON EMERGE MR 2.5X15 (BALLOONS) IMPLANT
BALLOON EMERGE MR 3.0X12 (BALLOONS) IMPLANT
BALLOON ~~LOC~~ EMERGE MR 3.75X15 (BALLOONS) IMPLANT
CATH HEARTRAIL IKARI 6F IR1.0 (CATHETERS) ×1 IMPLANT
CATH INFINITI 5FR JL4 (CATHETERS) ×1 IMPLANT
CATH OPTITORQUE TIG 4.5 5F (CATHETERS) ×1 IMPLANT
CATH ROTALINK PLUS 1.50MM (BURR) ×1 IMPLANT
DEVICE RAD COMP TR BAND LRG (VASCULAR PRODUCTS) ×1 IMPLANT
ELECT DEFIB PAD ADLT CADENCE (PAD) ×1 IMPLANT
GLIDESHEATH SLEND A-KIT 6F 20G (SHEATH) ×1 IMPLANT
KIT ENCORE 26 ADVANTAGE (KITS) ×1 IMPLANT
KIT HEART LEFT (KITS) ×3 IMPLANT
LUBRICANT VIPERSLIDE CORONARY (MISCELLANEOUS) ×1 IMPLANT
PACK CARDIAC CATHETERIZATION (CUSTOM PROCEDURE TRAY) ×3 IMPLANT
STENT SYNERGY DES 3.5X16 (Permanent Stent) ×1 IMPLANT
STENT SYNERGY DES 3.5X32 (Permanent Stent) ×1 IMPLANT
STOPCOCK MORSE 400PSI 3WAY (MISCELLANEOUS) ×1 IMPLANT
TRANSDUCER W/STOPCOCK (MISCELLANEOUS) ×3 IMPLANT
TUBING CIL FLEX 10 FLL-RA (TUBING) ×4 IMPLANT
WIRE COUGAR XT STRL 190CM (WIRE) ×1 IMPLANT
WIRE ROTA FLOPPY .009X325CM (WIRE) ×1 IMPLANT
WIRE SAFE-T 1.5MM-J .035X260CM (WIRE) ×1 IMPLANT

## 2016-02-10 NOTE — Progress Notes (Addendum)
Pt arrived pre procedure to cath holding via cardiovascular assistant from [place] reports being admitted yesterday with GI symptoms and chest pain as well as elevated cardiac enzymes. Symptom free at the moment. Placed on monitor, BP 146/73, SR in low 60's with possible 1st degree AVB, O2 sat 96% on room air. Awake and oriented but quickly asleep.

## 2016-02-10 NOTE — H&P (Signed)
Brian Mays is an 70 y.o. male.   Chief Complaint: Chest pain HPI: Brian Mays  is a 70 y.o. male with a history of HLD, essential hypertension, hypothyroidism, GERD and metastatic neuroendocrine carcinoma. He presented to the ED on 02/09/2016 with a 3 day history of chest and epigastric pain. He had attributed his symptoms to GERD, however, symptoms continued to worsen, becoming severe. He had initially presented to Urgent Care and was given a GI cocktail with improvement in symptoms. He had mentioned worsening symptoms with exertion, therefore a troponin was obtained. Pt was sent home and later called that evening after troponin resulted and was elevated at 0.26. He was advised to go to the ED and was admitted with possible NSTEMI.   On further discussion, pt reports an episode of severe chest pain with exertion 3-4 months ago and again 2-3 weeks ago. He states yesterday, he developed chest pain with even light amounts of exertion such as walking short distances. Pain eased off with rest, but did not go away completely until this morning. He is presently pain free with heparin gtt infusing.  Past Medical History  Diagnosis Date  . GERD (gastroesophageal reflux disease)   . Pancreatitis 1998    chronic  . HTN (hypertension)   . Hemorrhoid   . Hypothyroidism   . Colon polyps   . Diverticulosis   . Gallstones   . Hiatal hernia   . Heart murmur   . Anemia     in past  . Metastatic carcinoma [facility]) 2010    Dr Jonette Eva    Past Surgical History  Procedure Laterality Date  . Tumor excision  01/2009    Carcinoil Resection   . Liver biopsy  2010  . Tumor removal      from small intestine  . Laparoscopic cholecystectomy  1999  . Radioactive seed implant  X 3    "to my liver"  . Cataract extraction w/ intraocular lens  implant, bilateral Bilateral   . Appendectomy  1962    Family History  Problem Relation Age of Onset  . Kidney disease Mother   . Hyperlipidemia Mother   .  Hypertension Mother   . COPD Father   . Ulcerative colitis Daughter    Social History:  reports that he quit smoking about 18 years ago. His smoking use included Cigarettes. He started smoking about 56 years ago. He has a 52.5 pack-year smoking history. He has never used smokeless tobacco. He reports that he drinks about 1.8 - 2.4 oz of alcohol per week. He reports that he does not use illicit drugs.  Allergies:  Allergies  Allergen Reactions  . Iohexol Hives     Code: HIVES, Desc: PER [name] @ [facility]IMARY CARE, PT IS ALLERGIC TO CONTRAST DYE 10/02/08/RM  05/01/10...needs full premeds per our protocol w/ [facility] imaging., Onset Date: [id number]     Review of Systems - History obtained from chart review and the patient General ROS: negative for - chills, fatigue or night sweats Hematological and Lymphatic ROS: negative for - bleeding problems, blood clots or bruising Respiratory ROS: no cough, shortness of breath, or wheezing Cardiovascular ROS: positive for - chest pain negative for - edema, orthopnea, palpitations or paroxysmal nocturnal dyspnea Neurological ROS: no TIA or stroke symptoms  Blood pressure 106/64, pulse 54, temperature 98.4 F (36.9 C), temperature source Oral, resp. rate 16, height 5' 10.5" (1.791 m), weight 90.266 kg (199 lb), SpO2 94 %.   General appearance: alert, cooperative and  no distress Eyes: negative Neck: no adenopathy, no carotid bruit, no JVD, supple, symmetrical, trachea midline and thyroid not enlarged, symmetric, no tenderness/mass/nodules Resp: clear to auscultation bilaterally Chest wall: no tenderness Cardio: S1, S2 normal and systolic murmur: early systolic 2/6,   at 2nd right intercostal space, throughout the precordium GI: soft, non-tender; bowel sounds normal; no masses,  no organomegaly Extremities: extremities normal, atraumatic, no cyanosis or edema Pulses: 2+ and symmetric Skin: Skin color, texture, turgor normal. No rashes or lesions Neurologic: Grossly  normal  Results for orders placed or performed during the hospital encounter of 02/09/16 (from the past 48 hour(s))  I-stat troponin, ED     Status: Abnormal   Collection Time: 02/09/16  6:35 PM  Result Value Ref Range   Troponin i, poc 0.26 (HH) 0.00 - 0.08 ng/mL   Comment NOTIFIED PHYSICIAN    Comment 3            Comment: Due to the release kinetics of cTnI, a negative result within the first hours of the onset of symptoms does not rule out myocardial infarction with certainty. If myocardial infarction is still suspected, repeat the test at appropriate intervals.   Basic metabolic panel     Status: Abnormal   Collection Time: 02/09/16  6:42 PM  Result Value Ref Range   Sodium 139 135 - 145 mmol/L   Potassium 5.0 3.5 - 5.1 mmol/L   Chloride 101 101 - 111 mmol/L   CO2 28 22 - 32 mmol/L   Glucose, Bld 94 65 - 99 mg/dL   BUN 23 (H) 6 - 20 mg/dL   Creatinine, Ser 0.95 0.61 - 1.24 mg/dL   Calcium 9.5 8.9 - 10.3 mg/dL   GFR calc non Af Amer >60 >60 mL/min   GFR calc Af Amer >60 >60 mL/min    Comment: (NOTE) The eGFR has been calculated using the CKD EPI equation. This calculation has not been validated in all clinical situations. eGFR's persistently <60 mL/min signify possible Chronic Kidney Disease.    Anion gap 10 5 - 15  CBC     Status: None   Collection Time: 02/09/16  6:42 PM  Result Value Ref Range   WBC 9.1 4.0 - 10.5 K/uL   RBC 4.83 4.22 - 5.81 MIL/uL   Hemoglobin 13.3 13.0 - 17.0 g/dL   HCT 41.5 39.0 - 52.0 %   MCV 85.9 78.0 - 100.0 fL   MCH 27.5 26.0 - 34.0 pg   MCHC 32.0 30.0 - 36.0 g/dL   RDW 14.8 11.5 - 15.5 %   Platelets 191 150 - 400 K/uL  APTT     Status: None   Collection Time: 02/09/16  7:15 PM  Result Value Ref Range   aPTT 28 24 - 37 seconds  Protime-INR     Status: None   Collection Time: 02/09/16  7:15 PM  Result Value Ref Range   Prothrombin Time 14.0 11.6 - 15.2 seconds   INR 1.06 0.00 - 1.49  Brain natriuretic peptide (order if patient c/o  SOB ONLY)     Status: None   Collection Time: 02/09/16  7:15 PM  Result Value Ref Range   B Natriuretic Peptide 55.5 0.0 - 100.0 pg/mL  TSH     Status: None   Collection Time: 02/09/16 10:10 PM  Result Value Ref Range   TSH 3.764 0.350 - 4.500 uIU/mL  Troponin I     Status: Abnormal   Collection Time: 02/09/16 10:10 PM  Result  Value Ref Range   Troponin I 0.15 (H) <0.031 ng/mL    Comment:        PERSISTENTLY INCREASED TROPONIN VALUES IN THE RANGE OF 0.04-0.49 ng/mL CAN BE SEEN IN:       -UNSTABLE ANGINA       -CONGESTIVE HEART FAILURE       -MYOCARDITIS       -CHEST TRAUMA       -ARRYHTHMIAS       -LATE PRESENTING MYOCARDIAL INFARCTION       -COPD   CLINICAL FOLLOW-UP RECOMMENDED.   Lipid panel     Status: Abnormal   Collection Time: 02/09/16 10:10 PM  Result Value Ref Range   Cholesterol 147 0 - 200 mg/dL   Triglycerides 59 <150 mg/dL   HDL 35 (L) >40 mg/dL   Total CHOL/HDL Ratio 4.2 RATIO   VLDL 12 0 - 40 mg/dL   LDL Cholesterol 100 (H) 0 - 99 mg/dL    Comment:        Total Cholesterol/HDL:CHD Risk Coronary Heart Disease Risk Table                     Men   Women  1/2 Average Risk   3.4   3.3  Average Risk       5.0   4.4  2 X Average Risk   9.6   7.1  3 X Average Risk  23.4   11.0        Use the calculated Patient Ratio above and the CHD Risk Table to determine the patient's CHD Risk.        ATP III CLASSIFICATION (LDL):  <100     mg/dL   Optimal  100-129  mg/dL   Near or Above                    Optimal  130-159  mg/dL   Borderline  160-189  mg/dL   High  >190     mg/dL   Very High   Heparin level (unfractionated)     Status: Abnormal   Collection Time: 02/10/16  2:14 AM  Result Value Ref Range   Heparin Unfractionated 0.26 (L) 0.30 - 0.70 IU/mL    Comment:        IF HEPARIN RESULTS ARE BELOW EXPECTED VALUES, AND PATIENT DOSAGE HAS BEEN CONFIRMED, SUGGEST FOLLOW UP TESTING OF ANTITHROMBIN III LEVELS.   CBC     Status: Abnormal   Collection Time:  02/10/16  3:56 AM  Result Value Ref Range   WBC 6.9 4.0 - 10.5 K/uL   RBC 4.66 4.22 - 5.81 MIL/uL   Hemoglobin 12.5 (L) 13.0 - 17.0 g/dL   HCT 40.1 39.0 - 52.0 %   MCV 86.1 78.0 - 100.0 fL   MCH 26.8 26.0 - 34.0 pg   MCHC 31.2 30.0 - 36.0 g/dL   RDW 14.8 11.5 - 15.5 %   Platelets 171 150 - 400 K/uL  Troponin I     Status: Abnormal   Collection Time: 02/10/16  3:57 AM  Result Value Ref Range   Troponin I 0.21 (H) <0.031 ng/mL    Comment:        PERSISTENTLY INCREASED TROPONIN VALUES IN THE RANGE OF 0.04-0.49 ng/mL CAN BE SEEN IN:       -UNSTABLE ANGINA       -CONGESTIVE HEART FAILURE       -MYOCARDITIS       -CHEST TRAUMA       -  ARRYHTHMIAS       -LATE PRESENTING MYOCARDIAL INFARCTION       -COPD   CLINICAL FOLLOW-UP RECOMMENDED.   Urinalysis, Routine w reflex microscopic (not at [facility])     Status: None   Collection Time: 02/10/16  5:31 AM  Result Value Ref Range   Color, Urine YELLOW YELLOW   APPearance CLEAR CLEAR   Specific Gravity, Urine 1.015 1.005 - 1.030   pH 6.5 5.0 - 8.0   Glucose, UA NEGATIVE NEGATIVE mg/dL   Hgb urine dipstick NEGATIVE NEGATIVE   Bilirubin Urine NEGATIVE NEGATIVE   Ketones, ur NEGATIVE NEGATIVE mg/dL   Protein, ur NEGATIVE NEGATIVE mg/dL   Nitrite NEGATIVE NEGATIVE   Leukocytes, UA NEGATIVE NEGATIVE    Comment: MICROSCOPIC NOT DONE ON URINES WITH NEGATIVE PROTEIN, BLOOD, LEUKOCYTES, NITRITE, OR GLUCOSE <1000 mg/dL.   Dg Chest 2 View  02/09/2016  CLINICAL DATA:  Mid chest pain and shortness of breath. EXAM: CHEST  2 VIEW COMPARISON:  CT scan of the chest dated 12/19/2015 FINDINGS: Heart size and pulmonary vascularity are normal and the lungs are clear except for a calcified granuloma in the lingula. No effusions. Slight tortuosity of the thoracic aorta. No bone abnormality. IMPRESSION: No active cardiopulmonary disease. Electronically Signed   By: Lorriane Shire M.D.   On: 02/09/2016 19:39    Labs:   Lab Results  Component Value Date   WBC  6.9 02/10/2016   HGB 12.5* 02/10/2016   HCT 40.1 02/10/2016   MCV 86.1 02/10/2016   PLT 171 02/10/2016    Recent Labs Lab 02/09/16 1203 02/09/16 1842  NA 137 139  K 4.1 5.0  CL 100 101  CO2 30 28  BUN 20 23*  CREATININE 0.80 0.95  CALCIUM 9.5 9.5  PROT 8.1  --   BILITOT 0.5  --   ALKPHOS 62  --   ALT 15  --   AST 26  --   GLUCOSE 88 94    Lipid Panel     Component Value Date/Time   CHOL 147 02/09/2016 2210   TRIG 59 02/09/2016 2210   HDL 35* 02/09/2016 2210   CHOLHDL 4.2 02/09/2016 2210   VLDL 12 02/09/2016 2210   LDLCALC 100* 02/09/2016 2210    BNP (last 3 results)  Recent Labs  02/09/16 1915  BNP 55.5    HEMOGLOBIN A1C Lab Results  Component Value Date   HGBA1C 5.8 03/07/2015    Cardiac Panel (last 3 results)  Recent Labs  02/09/16 2210 02/10/16 0357  TROPONINI 0.15* 0.21*    Lab Results  Component Value Date   TROPONINI 0.21* 02/10/2016     TSH  Recent Labs  03/07/15 0824 02/09/16 2210  TSH 4.44 3.764    EKG 02/10/2016: sinus rhythm with 1st degree AV block at a rate of bpm, normal axis, no evidence of ischemia.      Outpatient Exercise Myoview stress test 09/24/2013: 1. Resting EKG showed normal sinus rhythm, poor R wave progression. Stress EKG was negative for ischemia. Patient exercised on BRUCE PROTOCOL for 6 minutes 30 seconds. The maximum work level achieved was 7.4 MET's. The test was terminated due to achievement of the target heart rate.  2. The perfusion imaging study demonstrated mild diaphragmatic attenuation artifact in the inferior wall which completely reversed in stress images.  There is no evidence of ischemia or scar. Dynamic gated images reveal normal wall motion and endocardial thickening. Left ventricular ejection fraction was estimated to be 79%.  This substance a low risk study.  Outpatient Echocardiogram 10/08/13: 1. Left ventricular cavity is normal in size.   Mild to moderate concentric hypertrophy.   Basal  septal  hypertrophy.   Normal global wall motion.   Normal systolic global function.   Calculated EF 60%.   Doppler evidence of Grade I (impaired) diastolic dysfunction. 2. Mitral valve structurally normal.   Trace mitral regurgitation.   Mitral valve inflow A > E ratio. 3. Tricuspid valve structurally normal.   Mild tricuspid regurgitation.  Facility-administered medications prior to admission  Medication Dose Route Frequency Provider Last Rate Last Dose  . gi cocktail (Maalox,Lidocaine,Donnatal)  30 mL Oral Once Darreld Mclean, MD       Medications Prior to Admission  Medication Sig Dispense Refill  . amLODipine (NORVASC) 5 MG tablet TAKE 1 TABLET DAILY 30 tablet 11  . aspirin EC 81 MG tablet Take 81 mg by mouth every morning.    . Cholecalciferol 1000 UNITS tablet Take 1,000 Units by mouth daily.      [name] CREON 24000 units CPEP Take 24,000 Units by mouth 3 (three) times daily with meals.     . diphenhydrAMINE (BENADRYL) 50 MG tablet Take 1 tablet (50 mg total) by mouth once. Take one hour before study 2 tablet 0  . fluticasone (FLONASE) 50 MCG/ACT nasal spray Place 2 sprays into both nostrils daily as needed (sinuses).   11  . hydrocortisone (ANUSOL-HC) 25 MG suppository INSERT 1 SUPPOSITORY RECTALLY TWICE A DAY AS NEEDED FOR HEMORRHOIDS 30 suppository 0  . Lanreotide Acetate (SOMATULINE DEPOT [place]) Inject 120 mcg into the skin every 28 (twenty-eight) days. Receives at Dr Antonieta Pert office    . lansoprazole (PREVACID) 30 MG capsule Take 1 capsule (30 mg total) by mouth daily at 12 noon. 90 capsule 2  . levothyroxine (SYNTHROID, LEVOTHROID) 50 MCG tablet TAKE 1 TABLET DAILY. 90 tablet 2  . losartan (COZAAR) 100 MG tablet TAKE 1 TABLET BY MOUTH EVERY MORNING 90 tablet 3  . metoCLOPramide (REGLAN) 10 MG tablet Take 10 mg by mouth daily.    . Omega-3 Fatty Acids (FISH OIL) 1000 MG CAPS Take 1 capsule by mouth daily. Reported on 12/30/2015    . PARoxetine (PAXIL) 10 MG tablet Take 1 tablet (10 mg  total) by mouth daily. 90 tablet 3  . predniSONE (DELTASONE) 50 MG tablet Take 4m at 13 hours, 7 hours, and 1 hour before scan 3 tablet 0  . sodium chloride (OCEAN) 0.65 % SOLN nasal spray Place 1 spray into both nostrils as needed for congestion.     . sucralfate (CARAFATE) 1 g tablet Take 1 tablet (1 g total) by mouth 4 (four) times daily -  with meals and at bedtime. 40 tablet 0  . sucralfate (CARAFATE) 1 g tablet Take 1 g by mouth 4 (four) times daily.    . vitamin B-12 (CYANOCOBALAMIN) 1000 MCG tablet Take 1,000 mcg by mouth daily.        Current facility-administered medications:  .  0.9 %  sodium chloride infusion, 250 mL, Intravenous, PRN, JAdrian Prows MD .  0.9 %  sodium chloride infusion, 250 mL, Intravenous, PRN, JAdrian Prows MD .  [EXPIRED] 0.9% sodium chloride infusion, 3 mL/kg/hr, Intravenous, Continuous, Last Rate: 276.3 mL/hr at 02/10/16 0618, 3 mL/kg/hr at 02/10/16 0618 **FOLLOWED BY** 0.9% sodium chloride infusion, 1 mL/kg/hr, Intravenous, Continuous, JAdrian Prows MD, Last Rate: 92.1 mL/hr at 02/10/16 0727, 1 mL/kg/hr at 02/10/16 0727 .  acetaminophen (TYLENOL) tablet 650  mg, 650 mg, Oral, Q4H PRN, Adrian Prows, MD .  amLODipine (NORVASC) tablet 5 mg, 5 mg, Oral, Daily, Adrian Prows, MD .  aspirin EC tablet 81 mg, 81 mg, Oral, q morning - 10a, Adrian Prows, MD .  atorvastatin (LIPITOR) tablet 10 mg, 10 mg, Oral, q1800, Adrian Prows, MD .  cholecalciferol (VITAMIN D) tablet 1,000 Units, 1,000 Units, Oral, Daily, Adrian Prows, MD .  diphenhydrAMINE (BENADRYL) capsule 50 mg, 50 mg, Oral, Once PRN, Adrian Prows, MD .  fluticasone (FLONASE) 50 MCG/ACT nasal spray 2 spray, 2 spray, Each Nare, Daily PRN, Adrian Prows, MD .  heparin ADULT infusion 100 units/mL (25000 units/250 mL), 1,250 Units/hr, Intravenous, Continuous, Franky Macho, RPH, Last Rate: 12.5 mL/hr at 02/10/16 0523, 1,250 Units/hr at 02/10/16 0523 .  levothyroxine (SYNTHROID, LEVOTHROID) tablet 50 mcg, 50 mcg, Oral, Daily, Adrian Prows, MD .   lipase/protease/amylase (CREON) capsule 24,000 Units, 24,000 Units, Oral, TID WC, Adrian Prows, MD .  losartan (COZAAR) tablet 100 mg, 100 mg, Oral, q morning - 10a, Adrian Prows, MD .  metoCLOPramide (REGLAN) tablet 10 mg, 10 mg, Oral, Daily, Adrian Prows, MD .  metoprolol tartrate (LOPRESSOR) tablet 25 mg, 25 mg, Oral, BID, Adrian Prows, MD, 25 mg at 02/09/16 2248 .  nitroGLYCERIN (NITROSTAT) SL tablet 0.4 mg, 0.4 mg, Sublingual, Q5 Min x 3 PRN, Adrian Prows, MD .  omega-3 acid ethyl esters (LOVAZA) capsule 2 g, 2 g, Oral, BID, Adrian Prows, MD, 2 g at 02/09/16 2249 .  ondansetron (ZOFRAN) injection 4 mg, 4 mg, Intravenous, Q6H PRN, Adrian Prows, MD .  pantoprazole (PROTONIX) EC tablet 20 mg, 20 mg, Oral, Daily, Adrian Prows, MD .  PARoxetine (PAXIL) tablet 10 mg, 10 mg, Oral, Daily, Adrian Prows, MD .  sodium chloride (OCEAN) 0.65 % nasal spray 1 spray, 1 spray, Each Nare, PRN, Adrian Prows, MD .  sodium chloride flush (NS) 0.9 % injection 3 mL, 3 mL, Intravenous, Q12H, Adrian Prows, MD, 0 mL at 02/09/16 2252 .  sodium chloride flush (NS) 0.9 % injection 3 mL, 3 mL, Intravenous, PRN, Adrian Prows, MD .  sodium chloride flush (NS) 0.9 % injection 3 mL, 3 mL, Intravenous, Q12H, Adrian Prows, MD, 3 mL at 02/09/16 2252 .  sodium chloride flush (NS) 0.9 % injection 3 mL, 3 mL, Intravenous, PRN, Adrian Prows, MD .  sucralfate (CARAFATE) tablet 1 g, 1 g, Oral, QID, Adrian Prows, MD, 1 g at 02/09/16 2249 .  vitamin B-12 (CYANOCOBALAMIN) tablet 1,000 mcg, 1,000 mcg, Oral, Daily, Adrian Prows, MD    Assessment/Plan 1. Chest pain 2. NSTEMI 3. HLD 4. Essential Hypertension  Recommendation: Symptoms concerning for unstable angina with positive troponin. Recommend coronary angiogram for further evaluation of coronary anatomy, with possible angioplasty. We discussed regarding risks, benefits, alternatives to this including stress testing, CTA, and continued medical therapy. Patient wants to proceed. Understands <1-2% risk of death, stroke, MI, urgent  CABG, bleeding, infection, renal failure but not limited to these.   Rachel Bo, NP-C 02/10/2016, 8:11 AM [facility]. [facility] Pager: [phone] Office: [phone]

## 2016-02-10 NOTE — Progress Notes (Signed)
ANTICOAGULATION CONSULT NOTE - Follow-up Consult  Pharmacy Consult for Heparin Indication: chest pain/ACS  Allergies  Allergen Reactions  . Iohexol Hives     Code: HIVES, Desc: PER [name] @ [facility], PT IS ALLERGIC TO CONTRAST DYE 10/02/08/RM  05/01/10...needs full premeds per our protocol w/ [facility] imaging., Onset Date: [id number]     Patient Measurements: Height: 5' 10.5" (179.1 cm) Weight: 199 lb (90.266 kg) IBW/kg (Calculated) : 74.15 Heparin Dosing Weight: 83.4 kg  Vital Signs: Temp: 98.4 F (36.9 C) (04/11 0500) Temp Source: Oral (04/11 0500) BP: 122/81 mmHg (04/11 0800) Pulse Rate: 54 (04/11 0500)  Labs:  Recent Labs  02/09/16 1203 02/09/16 1842 02/09/16 1915 02/09/16 2210 02/10/16 0214 02/10/16 0356 02/10/16 0357 02/10/16 1017  HGB 13.5 13.3  --   --   --  12.5*  --   --   HCT 40.6 41.5  --   --   --  40.1  --   --   PLT 202.0 191  --   --   --  171  --   --   APTT  --   --  28  --   --   --   --   --   LABPROT  --   --  14.0  --   --   --   --   --   INR  --   --  1.06  --   --   --   --   --   HEPARINUNFRC  --   --   --   --  0.26*  --   --  0.45  CREATININE 0.80 0.95  --   --   --   --   --   --   TROPONINI  --   --   --  0.15*  --   --  0.21* 0.25*    Estimated Creatinine Clearance: 84.8 mL/min (by C-G formula based on Cr of 0.95).  Assessment: 53-yo male on heparin for ACS/chest pain. Heparin level therapeutic on 1250 units/hr. CBC relatively stable. Plan for cardiac cath this afternoon.  Goal of Therapy:  Heparin level 0.3-0.7 units/ml Monitor platelets by anticoagulation protocol: Yes   Plan:  Continue heparin at 1250 units/hr F/u after cath  [facility], Pharm.D., BCPS Clinical Pharmacist Pager [phone] 02/10/2016 11:45 AM

## 2016-02-10 NOTE — Interval H&P Note (Signed)
History and Physical Interval Note:  02/10/2016 1:43 PM  Brian Mays  has presented today for surgery, with the diagnosis of cp  The various methods of treatment have been discussed with the patient and family. After consideration of risks, benefits and other options for treatment, the patient has consented to  Procedure(s): Left Heart Cath and Coronary Angiography (N/A) and possible PTCA as a surgical intervention .  The patient's history has been reviewed, patient examined, no change in status, stable for surgery.  I have reviewed the patient's chart and labs.  Questions were answered to the patient's satisfaction.   Cath Lab Visit (complete for each Cath Lab visit)  Clinical Evaluation Leading to the Procedure:   ACS: Yes.    Non-ACS:    Anginal Classification: CCS IV  Anti-ischemic medical therapy: Minimal Therapy (1 class of medications)  Non-Invasive Test Results: No non-invasive testing performed  Prior CABG: No previous CABG        Adrian Prows

## 2016-02-10 NOTE — Progress Notes (Signed)
ANTICOAGULATION CONSULT NOTE - Follow-up Consult  Pharmacy Consult for Heparin Indication: chest pain/ACS  Allergies  Allergen Reactions  . Iohexol Hives     Code: HIVES, Desc: PER [name] @ [facility], PT IS ALLERGIC TO CONTRAST DYE 10/02/08/RM  05/01/10...needs full premeds per our protocol w/ [facility] imaging., Onset Date: [id number]     Patient Measurements: Height: 5' 10.5" (179.1 cm) Weight: 199 lb (90.266 kg) IBW/kg (Calculated) : 74.15 Heparin Dosing Weight: 83.4 kg  Vital Signs: Temp: 98.8 F (37.1 C) (04/10 2239) Temp Source: Oral (04/10 2239) BP: 150/79 mmHg (04/10 2239) Pulse Rate: 68 (04/10 2239)  Labs:  Recent Labs  02/09/16 1203 02/09/16 1842 02/09/16 1915 02/09/16 2210 02/10/16 0214 02/10/16 0356 02/10/16 0357  HGB 13.5 13.3  --   --   --  12.5*  --   HCT 40.6 41.5  --   --   --  40.1  --   PLT 202.0 191  --   --   --  171  --   APTT  --   --  28  --   --   --   --   LABPROT  --   --  14.0  --   --   --   --   INR  --   --  1.06  --   --   --   --   HEPARINUNFRC  --   --   --   --  0.26*  --   --   CREATININE 0.80 0.95  --   --   --   --   --   TROPONINI  --   --   --  0.15*  --   --  0.21*    Estimated Creatinine Clearance: 84.8 mL/min (by C-G formula based on Cr of 0.95).  Assessment: 52-yo male on heparin for ACS/chest pain. Heparin level subtherapeutic on 1000 units/hr. CBC relatively stable. No issues with line or bleeding reported per RN.  Goal of Therapy:  Heparin level 0.3-0.7 units/ml Monitor platelets by anticoagulation protocol: Yes   Plan:  Increase heparin to 1250 units/hr F/u 6 hr heparin level  Sherlon Handing, PharmD, BCPS Clinical pharmacist, pager [phone] 02/10/2016,5:00 AM

## 2016-02-11 ENCOUNTER — Telehealth: Payer: Self-pay | Admitting: *Deleted

## 2016-02-11 ENCOUNTER — Encounter (HOSPITAL_COMMUNITY): Payer: Self-pay | Admitting: Cardiology

## 2016-02-11 LAB — CBC
HCT: 38.5 % — ABNORMAL LOW (ref 39.0–52.0)
Hemoglobin: 12.1 g/dL — ABNORMAL LOW (ref 13.0–17.0)
MCH: 26.7 pg (ref 26.0–34.0)
MCHC: 31.4 g/dL (ref 30.0–36.0)
MCV: 85 fL (ref 78.0–100.0)
PLATELETS: 186 10*3/uL (ref 150–400)
RBC: 4.53 MIL/uL (ref 4.22–5.81)
RDW: 14.4 % (ref 11.5–15.5)
WBC: 14.9 10*3/uL — ABNORMAL HIGH (ref 4.0–10.5)

## 2016-02-11 LAB — BASIC METABOLIC PANEL
Anion gap: 10 (ref 5–15)
BUN: 12 mg/dL (ref 6–20)
CALCIUM: 8.8 mg/dL — AB (ref 8.9–10.3)
CO2: 25 mmol/L (ref 22–32)
CREATININE: 0.87 mg/dL (ref 0.61–1.24)
Chloride: 104 mmol/L (ref 101–111)
GFR calc Af Amer: >60 mL/min (ref >60)
GLUCOSE: 138 mg/dL — AB (ref 65–99)
Potassium: 4.4 mmol/L (ref 3.5–5.1)
Sodium: 139 mmol/L (ref 135–145)

## 2016-02-11 MED ORDER — NITROGLYCERIN 0.4 MG SL SUBL: 0.4000 mg | SUBLINGUAL_TABLET | SUBLINGUAL | Status: DC | PRN

## 2016-02-11 MED ORDER — METOPROLOL TARTRATE 25 MG PO TABS: 25.0000 mg | ORAL_TABLET | Freq: Two times a day (BID) | ORAL | Status: DC

## 2016-02-11 MED ORDER — ATORVASTATIN CALCIUM 80 MG PO TABS: 80.0000 mg | ORAL_TABLET | Freq: Every day | ORAL | Status: DC

## 2016-02-11 MED ORDER — TICAGRELOR 90 MG PO TABS: 90.0000 mg | ORAL_TABLET | Freq: Two times a day (BID) | ORAL | Status: DC

## 2016-02-11 MED FILL — Verapamil HCl IV Soln 2.5 MG/ML: INTRAVENOUS | Qty: 2 | Status: AC

## 2016-02-11 NOTE — Progress Notes (Signed)
CARDIAC REHAB PHASE I   PRE:  Rate/Rhythm: 38 SR  BP:  Sitting: 119/66        SaO2: 98 RA  MODE:  Ambulation: 500 ft (+ 1000 ft independently)   POST:  Rate/Rhythm: 77 SR  BP:  Sitting: 159/88         SaO2: 99 RA  Pt ambulated 500 ft on RA (had already ambulated 1000 ft independently this morning), independent, steady gait, tolerated well, no complaints. Completed MI/stent education. Reviewed risk factors, anti-platelet therapy, stent card, activity restrictions, ntg, exercise, heart healthy diet, and phase 2 cardiac rehab. Pt verbalized understanding. Pt agrees to phase 2 cardiac rehab referral, will send to [facility] per pt request. Pt to see case manager prior to discharge regarding brilinta. Pt to edge of bed per pt request after walk (up ad lib in room), call bell within reach, awaiting discharge.   [id number] Lenna Sciara, RN, BSN 02/11/2016 8:45 AM

## 2016-02-11 NOTE — Progress Notes (Signed)
TR BAND REMOVAL  LOCATION:   r right radial  DEFLATED PER PROTOCOL:    Yes.    TIME BAND OFF / DRESSING APPLIED:    1945    SITE UPON ARRIVAL:    Level 0  SITE AFTER BAND REMOVAL:    Level 0  CIRCULATION SENSATION AND MOVEMENT:    Within Normal Limits   Yes.    COMMENTS:   Site stable, some redness noted to area just below(proximal) puncture site.

## 2016-02-11 NOTE — Discharge Summary (Addendum)
Physician Discharge Summary  Patient ID: Brian Mays MRN: [id number] DOB/AGE: [birth year] 69 y.o.  Admit date: 02/09/2016 Discharge date: 02/11/2016  Primary Discharge Diagnosis: CAD s/p PTCA and rotational atherectomy with 1.5 mm burr followed by stenting of tandem lesion in the proximal to mid segment of the RCA and mid to distal segment of the right coronary artery with implantation of two 3.5 x 32 mm and a 3.5 x 16 mm Synergy DES for high-grade calcific 95% to 90% stenosis respectively.   Secondary Discharge Diagnosis: 1. NSTEMI 2. HLD 3. Essential Hypertension 4. Hyperlipidemia, mild and low HDL.  Significant Diagnostic Studies: Coronary angiography 02/10/2016: Normal LV systolic function, EF 0000000. Severely diffusely calcified coronary vessels. Superdominant right coronary artery with very small LAD and circumflex coronary artery. Tandem proximal to mid and mid to distal high-grade 95% and 90% calcific stenosis in the superdominant RCA. Mid LAD 20-30% at most 40% calcific stenosis and circumflex mild disease.  Hospital Course:  Brian Mays is a 70 y.o. male with a history of HLD, essential hypertension, hypothyroidism, GERD and metastatic neuroendocrine carcinoma. He presented to the ED on 02/09/2016 with a 3 day history of chest and epigastric pain. He had attributed his symptoms to GERD, however, symptoms continued to worsen, becoming severe. He had initially presented to Urgent Care and was given a GI cocktail with improvement in symptoms. He had mentioned worsening symptoms with exertion, therefore a troponin was obtained. Pt was sent home and later called that evening after troponin resulted and was elevated at 0.26. He was advised to go to the ED and was admitted with possible NSTEMI.   On further discussion, pt reports an episode of severe chest pain with exertion 3-4 months ago and again 2-3 weeks ago. He states that on the day before admission, he developed chest pain  with even light amounts of exertion such as walking short distances. Pain eased off with rest, but did not go away completely until after heparin gtt was started during admission. Given elevated troponin and symptoms concerning for unstable angina, he was scheduled for coronary angiogram. He underwent catheterization on 02/10/2016 with successful PTCA and rotational atherectomy with 1.5 mm burr followed by stenting of tandem lesion in the proximal to mid segment of the RCA and mid to distal segment of the right coronary artery with implantation of two 3.5 x 32 mm and a 3.5 x 16 mm Synergy DES for high-grade calcific 95% to 90% stenosis respectively.    Recommendations on discharge: Follow up outpatient next week. Patient will need aggressive risk factor modification. He will need BRILINTA for one year along with aspirin 81 mg indefinitely.  Discharge Exam: Blood pressure 130/70, pulse 66, temperature 97.8 F (36.6 C), temperature source Oral, resp. rate 16, height 5' 10.5" (1.791 m), weight 91 kg (200 lb 9.9 oz), SpO2 98 %.    General appearance: alert, cooperative and no distress Eyes: negative Neck: no adenopathy, soft bilateral carotid bruit, no JVD, supple, symmetrical, trachea midline and thyroid not enlarged, symmetric, no tenderness/mass/nodules Resp: clear to auscultation bilaterally Chest wall: no tenderness Cardio: S1, S2 normal and systolic murmur, 2/6, at 2nd right intercostal space, heard throughout the precordium GI: soft, non-tender; bowel sounds normal; no masses,  no organomegaly Extremities: extremities normal, atraumatic, no cyanosis or edema Pulses: 2+ and symmetric. Radial access has healed well.  Skin: Skin color, texture, turgor normal. No rashes or lesions Neurologic: Grossly normal  Labs:   Lab Results  Component Value Date  WBC 14.9* 02/11/2016   HGB 12.1* 02/11/2016   HCT 38.5* 02/11/2016   MCV 85.0 02/11/2016   PLT 186 02/11/2016    Recent Labs Lab  02/09/16 1203  02/11/16 0323  NA 137  < > 139  K 4.1  < > 4.4  CL 100  < > 104  CO2 30  < > 25  BUN 20  < > 12  CREATININE 0.80  < > 0.87  CALCIUM 9.5  < > 8.8*  PROT 8.1  --   --   BILITOT 0.5  --   --   ALKPHOS 62  --   --   ALT 15  --   --   AST 26  --   --   GLUCOSE 88  < > 138*  < > = values in this interval not displayed.  Lipid Panel     Component Value Date/Time   CHOL 147 02/09/2016 2210   TRIG 59 02/09/2016 2210   HDL 35* 02/09/2016 2210   CHOLHDL 4.2 02/09/2016 2210   VLDL 12 02/09/2016 2210   LDLCALC 100* 02/09/2016 2210    Recent Labs  02/09/16 1915  BNP 55.5    HEMOGLOBIN A1C Lab Results  Component Value Date   HGBA1C 5.8 03/07/2015    Recent Labs  02/09/16 2210 02/10/16 0357 02/10/16 1017  TROPONINI 0.15* 0.21* 0.25*    Lab Results  Component Value Date   TROPONINI 0.25* 02/10/2016     Recent Labs  03/07/15 0824 02/09/16 2210  TSH 4.44 3.764   EKG 02/11/2016: Sinus rhythm with first-degree AV block at a rate of 63 bpm, normal axis, poor R-wave progression, anterior infarct old.  No evidence of ischemia.   Radiology: Dg Chest 2 View  02/09/2016  CLINICAL DATA:  Mid chest pain and shortness of breath. EXAM: CHEST  2 VIEW COMPARISON:  CT scan of the chest dated 12/19/2015 FINDINGS: Heart size and pulmonary vascularity are normal and the lungs are clear except for a calcified granuloma in the lingula. No effusions. Slight tortuosity of the thoracic aorta. No bone abnormality. IMPRESSION: No active cardiopulmonary disease. Electronically Signed   By: Lorriane Shire M.D.   On: 02/09/2016 19:39      FOLLOW UP PLANS AND APPOINTMENTS  Follow-up Information    Follow up with Adrian Prows, MD. Schedule an appointment as soon as possible for a visit in 1 week.   Specialty:  Cardiology   Contact information:   [address] [place] [address] [phone]         Medication List    TAKE these medications         amLODipine 5 MG tablet  Commonly known as:  NORVASC  TAKE 1 TABLET DAILY  Notes to Patient:  Decreases chest pain and blood pressure     aspirin EC 81 MG tablet  Take 81 mg by mouth every morning.  Notes to Patient:  Prevents clotting in stent and heart attack     atorvastatin 80 MG tablet  Commonly known as:  LIPITOR  Take 1 tablet (80 mg total) by mouth daily at 6 PM.  Notes to Patient:  Cholesterol      Cholecalciferol 1000 units tablet  Take 1,000 Units by mouth daily.  Notes to Patient:  Supplement      CREON 24000 units Cpep  Generic drug:  Pancrelipase (Lip-Prot-Amyl)  Take 24,000 Units by mouth 3 (three) times daily with meals.  Notes to Patient:  Enzyme      diphenhydrAMINE 50 MG tablet  Commonly known as:  BENADRYL  Take 1 tablet (50 mg total) by mouth once. Take one hour before study     Fish Oil 1000 MG Caps  Take 1 capsule by mouth daily. Reported on 12/30/2015  Notes to Patient:  Supplement/cholesterol      fluticasone 50 MCG/ACT nasal spray  Commonly known as:  FLONASE  Place 2 sprays into both nostrils daily as needed (sinuses).     hydrocortisone 25 MG suppository  Commonly known as:  ANUSOL-HC  INSERT 1 SUPPOSITORY RECTALLY TWICE A DAY AS NEEDED FOR HEMORRHOIDS     lansoprazole 30 MG capsule  Commonly known as:  PREVACID  Take 1 capsule (30 mg total) by mouth daily at 12 noon.  Notes to Patient:  Acid refux     levothyroxine 50 MCG tablet  Commonly known as:  SYNTHROID, LEVOTHROID  TAKE 1 TABLET DAILY.  Notes to Patient:  thyroid     losartan 100 MG tablet  Commonly known as:  COZAAR  TAKE 1 TABLET BY MOUTH EVERY MORNING  Notes to Patient:  Blood pressure     metoCLOPramide 10 MG tablet  Commonly known as:  REGLAN  Take 10 mg by mouth daily.     metoprolol tartrate 25 MG tablet  Commonly known as:  LOPRESSOR  Take 1 tablet (25 mg total) by mouth 2 (two) times daily.  Notes to Patient:  Decreases the work of the heart Decreases heart rate  and blood pressure     nitroGLYCERIN 0.4 MG SL tablet  Commonly known as:  NITROSTAT  Place 1 tablet (0.4 mg total) under the tongue every 5 (five) minutes x 3 doses as needed for chest pain.     PARoxetine 10 MG tablet  Commonly known as:  PAXIL  Take 1 tablet (10 mg total) by mouth daily.  Notes to Patient:  Depression      predniSONE 50 MG tablet  Commonly known as:  DELTASONE  Take 50mg  at 13 hours, 7 hours, and 1 hour before scan     sodium chloride 0.65 % Soln nasal spray  Commonly known as:  OCEAN  Place 1 spray into both nostrils as needed for congestion.     SOMATULINE DEPOT [place]  Inject 120 mcg into the skin every 28 (twenty-eight) days. Receives at Dr Antonieta Pert office     sucralfate 1 g tablet  Commonly known as:  CARAFATE  Take 1 g by mouth 4 (four) times daily.     sucralfate 1 g tablet  Commonly known as:  CARAFATE  Take 1 tablet (1 g total) by mouth 4 (four) times daily -  with meals and at bedtime.     ticagrelor 90 MG Tabs tablet  Commonly known as:  BRILINTA  Take 1 tablet (90 mg total) by mouth 2 (two) times daily.  Notes to Patient:  Prevents clotting in stent and heart attack     vitamin B-12 1000 MCG tablet  Commonly known as:  CYANOCOBALAMIN  Take 1,000 mcg by mouth daily.  Notes to Patient:  Supplement           Adrian Prows, MD 02/11/2016, 8:13 AM [facility] Pager: [phone] Office: [phone]

## 2016-02-11 NOTE — Care Management Note (Signed)
Case Management Note  Patient Details  Name: Brian Mays MRN: [id number] Date of Birth: [birth year]  Subjective/Objective:  Patient is from home with spouse, pta indep.  NCM gave patient the 30 day savings card, and his co pay is $45, he goes to the [facility] pharmacy , called to make sure they have in stock , they do and they ran the coupon through and he will get first 30 days free. No other needs.                   Action/Plan:   Expected Discharge Date:                  Expected Discharge Plan:  Home/Self Care  In-House Referral:     Discharge planning Services  CM Consult  Post Acute Care Choice:    Choice offered to:     DME Arranged:    DME Agency:     HH Arranged:    [place] Agency:     Status of Service:  Completed, signed off  Medicare Important Message Given:    Date Medicare IM Given:    Medicare IM give by:    Date Additional Medicare IM Given:    Additional Medicare Important Message give by:     If discussed at [place] of Stay Meetings, dates discussed:    Additional Comments:  Zenon Mayo, RN 02/11/2016, 10:49 AM

## 2016-02-11 NOTE — Discharge Instructions (Signed)
Acute Coronary Syndrome °Acute coronary syndrome (ACS) is a serious problem in which there is suddenly not enough blood and oxygen supplied to the heart. ACS may mean that one or more of the blood vessels in your heart (coronary arteries) may be blocked. ACS can result in chest pain or a heart attack (myocardial infarction or MI). °CAUSES °This condition is caused by atherosclerosis, which is the buildup of fat and cholesterol (plaque) on the inside of the arteries. Over time, the plaque may narrow or block the artery, and this will lessen blood flow to the heart. Plaque can also become weak and break off within a coronary artery to form a clot and cause a sudden blockage. °RISK FACTORS °The risks factors of this condition include: °· High cholesterol levels. °· High blood pressure (hypertension). °· Smoking. °· Diabetes. °· Age. °· Family history of chest pain, heart disease, or stroke. °· Lack of exercise. °SYMPTOMS °The most common signs of this condition include: °· Chest pain, which can be: °¨ A crushing or squeezing in the chest. °¨ A tightness, pressure, fullness, or heaviness in the chest. °¨ Present for more than a few minutes, or it can stop and recur. °· Pain in the arms, neck, jaw, or back. °· Unexplained heartburn or indigestion. °· Shortness of breath. °· Nausea. °· Sudden cold sweats. °· Feeling light-headed or dizzy. °Sometimes, this condition has no symptoms. °DIAGNOSIS °ACS may be diagnosed through the following tests: °· Electrocardiogram (ECG). °· Blood tests. °· Coronary angiogram. This is a procedure to look at the coronary arteries to see if there is any blockage. °TREATMENT °Treatment for ACS may include: °· Healthy behavioral changes to reduce or control risk factors. °· Medicine. °· Coronary stenting. A stent helps to keep an artery open. °· Coronary angioplasty. This procedure widens a narrowed or blocked artery. °· Coronary artery bypass surgery. This will allow your blood to pass the  blockage (bypass) to reach your heart. °HOME CARE INSTRUCTIONS °Eating and Drinking °· Follow a heart-healthy diet. A dietitian can you help to educate you about healthy food options and changes. °· Use healthy cooking methods such as roasting, grilling, broiling, baking, poaching, steaming, or stir-frying. Talk to a dietitian to learn more about healthy cooking methods. °Medicines °· Take medicines only as directed by your health care provider. °· Do not take the following medicines unless your health care provider approves: °¨ Nonsteroidal anti-inflammatory drugs (NSAIDs), such as ibuprofen, naproxen, or celecoxib. °¨ Vitamin supplements that contain vitamin A, vitamin E, or both. °¨ Hormone replacement therapy that contains estrogen with or without progestin. °· Stop illegal drug use. °Activities °· Follow an exercise program that is approved by your health care provider. °· Plan rest periods when you are fatigued. °Lifestyle °· Do not use any tobacco products, including cigarettes, chewing tobacco, or electronic cigarettes. If you need help quitting, ask your health care provider. °· If you drink alcohol, and your health care provider approves, limit your alcohol intake to no more than 1 drink per day. One drink equals 12 ounces of beer, 5 ounces of wine, or 1½ ounces of hard liquor. °· Learn to manage stress. °· Maintain a healthy weight. Lose weight as approved by your health care provider. °General Instructions °· Manage other health conditions, such as hypertension and diabetes, as directed by your health care provider. °· Keep all follow-up visits as directed by your health care provider. This is important. °· Your health care provider may ask you to monitor your blood   pressure. A blood pressure reading consists of a higher number over a lower number, such as 110 over 72, written as 110/72. Ideally, your blood pressure should be:  Below 140/90 if you have no other medical conditions.  Below 130/80 if  you have diabetes or kidney disease. SEEK IMMEDIATE MEDICAL CARE IF:  You have pain in your chest, neck, arm, jaw, stomach, or back that lasts more than a few minutes, is recurring, or is not relieved by taking medicine under your tongue (sublingual nitroglycerin).  You have profuse sweating without cause.  You have unexplained:  Heartburn or indigestion.  Shortness of breath or difficulty breathing.  Nausea or vomiting.  Fatigue.  Feelings of nervousness or anxiety.  Weakness.  Diarrhea.  You have sudden light-headedness or dizziness.  You faint. These symptoms may represent a serious problem that is an emergency. Do not wait to see if the symptoms will go away. Get medical help right away. Call your local emergency services (911 in the [place] Do not drive yourself to the clinic or hospital.   This information is not intended to replace advice given to you by your health care provider. Make sure you discuss any questions you have with your health care provider.   Document Released: 10/18/2005 Document Revised: 11/08/2014 Document Reviewed: 02/19/2014 Elsevier Interactive Patient Education 2016 [employer].  Sexual Activity and Heart Disease Sexual intimacy is an important part of your well-being. After a cardiac event, you may be worried about having sex. Most people can continue to have an active sex life after a cardiac event. If you or your partner have any questions about sexual activity, be sure to talk to your health care provider.  WHEN CAN YOU HAVE SEX AFTER A CARDIAC EVENT?  Resuming sexual activity (intercourse, masturbation, or oral sex) depends on the type of cardiac event you had. If you had a heart attack, it may be okay to have sex after 10 days. If you had a complicated cardiac event or heart surgery, you may not be able to have sex for as long as 8 weeks. Talk to your health care provider about safely resuming sex after your cardiac event. HOW DO YOU KNOW IF  YOU ARE READY TO RETURN TO SEXUAL ACTIVITY? Returning to sexual activity depends on your physical comfort, mental readiness, and previous sexual habits. Physically, sexual activity is no more work than climbing two flights of stairs or walking briskly for 20 minutes. If you can do these activities without having angina, having shortness of breath, or becoming overly tired, it is okay to have sex. WHAT DO I NEED TO KNOW ABOUT SEXUAL ACTIVITY AFTER A CARDIAC EVENT? After a cardiac event, some prescribed medicines may affect your sexual function. These effects can include little or no desire for sex, difficulty achieving or maintaining an erection, decreased vaginal lubrication, or the inability to achieve an orgasm. If any of these happen, do not stop taking your medicine. Talk to your health care provider about sexual dysfunction problems.   Talk to your health care provider before taking herbal medicines or vitamins. These can interfere with prescribed medicines and heart function.  If you take medicine for sexual dysfunction, avoid medicine such as nitroglycerin or long-acting nitrate medicine for 24 hours. Using a sexual dysfunction drug and a nitrate medicine together can cause a serious drop in blood pressure.  If you have angina and have taken a sexual dysfunction drug, go to your local emergency department for treatment. The emotional stress of  a cardiac event can affect you and your partner's intimacy. It is important to:  Choose a relaxing atmosphere.  Talk openly and honestly with each other.  Be patient with each other.  Increase and strengthen intimacy by doing things such as caressing, touching, and holding each other. HOW DO I CARE FOR MYSELF AT HOME?  Avoid having sex after a heavy meal.  Avoid having too much alcohol before sex.  Participating in a cardiac rehabilitation program or regular exercise can benefit your sex life. It builds strength and endurance. After exercising,  have sex when you feel rested and relaxed.  Ask your partner to take a more active role during sex.  Ask your health care provider when it is okay to have anal sex.  If you have angina during sex and are not on a sexual dysfunction drug, stop having sex and take a nitroglycerin as directed. If the angina goes away, you may resume sexual activity.  Always talk to your health care provider if you are experiencing sexual dysfunction, depression, or any type of pain.   This information is not intended to replace advice given to you by your health care provider. Make sure you discuss any questions you have with your health care provider.   Document Released: 08/08/2013 Document Revised: 11/08/2014 Document Reviewed: 08/08/2013 Elsevier Interactive Patient Education [employer].

## 2016-02-11 NOTE — Telephone Encounter (Signed)
Pt was on TCM list admitted for CAD, s/p PTCA & Rational Atherectomy. D/C 02/11/16 will f/u w/cardiology in 1 wk Dr. Einar Gip...Brian Mays

## 2016-03-08 ENCOUNTER — Other Ambulatory Visit: Payer: Self-pay

## 2016-03-08 ENCOUNTER — Ambulatory Visit (INDEPENDENT_AMBULATORY_CARE_PROVIDER_SITE_OTHER): Payer: Medicare PPO

## 2016-03-08 ENCOUNTER — Telehealth: Payer: Self-pay

## 2016-03-08 VITALS — BP 128/70 | Ht 70.0 in | Wt 201.8 lb

## 2016-03-08 DIAGNOSIS — Z0000 Encounter for general adult medical examination without abnormal findings: Secondary | ICD-10-CM | POA: Diagnosis not present

## 2016-03-08 DIAGNOSIS — Z7289 Other problems related to lifestyle: Secondary | ICD-10-CM | POA: Diagnosis not present

## 2016-03-08 NOTE — Telephone Encounter (Signed)
No need to take an abx prior to dental procedures Thx

## 2016-03-08 NOTE — Telephone Encounter (Signed)
Patients dentist office called. Patient is there for a teeth cleaning. Pt recently had a heart attack.   Office would like to know if PCP requires premedication before teeth cleaning and a letter stating same. [phone] fax Okay to fax letter?   OFFICE: [facility]ina Reach, [facility]

## 2016-03-08 NOTE — Patient Instructions (Addendum)
Brian Mays , Thank you for taking time to come for your Medicare Wellness Visit. I appreciate your ongoing commitment to your health goals. Please review the following plan we discussed and let me know if I can assist you in the future.   Medication review/ Adherence good; needs help with [employer]  [email or web address];  [email or web address]   [facility] / helps with hearing aid No reviews  [facility]  [phone]  [phone]  Call [employer] (customer service)  and inquire as to what the Out of pocket maxium is on the part B side (medical)  of the benefit and the Part D (medicine)  side     These are the goals we discussed: Goals    . patient     Will start eating more fruit Can try smoothies;        This is a list of the screening recommended for you and due dates:  Health Maintenance  Topic Date Due  .  Hepatitis C: One time screening is recommended by Center for Disease Control  (CDC) for  adults born from [birth year] through 1964.   [birth year]  . Shingles Vaccine  02/13/2007  . Pneumonia vaccines (2 of 2 - PPSV23) 03/29/2016  . Flu Shot  06/01/2016  . Tetanus Vaccine  03/05/2025  . Colon Cancer Screening  12/08/2025     Fall Prevention in the Home  Falls can cause injuries. They can happen to people of all ages. There are many things you can do to make your home safe and to help prevent falls.  WHAT CAN I DO ON THE OUTSIDE OF MY HOME?  Regularly fix the edges of walkways and driveways and fix any cracks.  Remove anything that might make you trip as you walk through a door, such as a raised step or threshold.  Trim any bushes or trees on the path to your home.  Use bright outdoor lighting.  Clear any walking paths of anything that might make someone trip, such as rocks or tools.  Regularly check to see if handrails are loose or broken. Make sure that both sides of any steps have handrails.  Any raised decks and  porches should have guardrails on the edges.  Have any leaves, snow, or ice cleared regularly.  Use sand or salt on walking paths during winter.  Clean up any spills in your garage right away. This includes oil or grease spills. WHAT CAN I DO IN THE BATHROOM?   Use night lights.  Install grab bars by the toilet and in the tub and shower. Do not use towel bars as grab bars.  Use non-skid mats or decals in the tub or shower.  If you need to sit down in the shower, use a plastic, non-slip stool.  Keep the floor dry. Clean up any water that spills on the floor as soon as it happens.  Remove soap buildup in the tub or shower regularly.  Attach bath mats securely with double-sided non-slip rug tape.  Do not have throw rugs and other things on the floor that can make you trip. WHAT CAN I DO IN THE BEDROOM?  Use night lights.  Make sure that you have a light by your bed that is easy to reach.  Do not use any sheets or blankets that are too big for your bed. They should not hang down onto the floor.  Have a firm chair that has side arms. You  can use this for support while you get dressed.  Do not have throw rugs and other things on the floor that can make you trip. WHAT CAN I DO IN THE KITCHEN?  Clean up any spills right away.  Avoid walking on wet floors.  Keep items that you use a lot in easy-to-reach places.  If you need to reach something above you, use a strong step stool that has a grab bar.  Keep electrical cords out of the way.  Do not use floor polish or wax that makes floors slippery. If you must use wax, use non-skid floor wax.  Do not have throw rugs and other things on the floor that can make you trip. WHAT CAN I DO WITH MY STAIRS?  Do not leave any items on the stairs.  Make sure that there are handrails on both sides of the stairs and use them. Fix handrails that are broken or loose. Make sure that handrails are as long as the stairways.  Check any  carpeting to make sure that it is firmly attached to the stairs. Fix any carpet that is loose or worn.  Avoid having throw rugs at the top or bottom of the stairs. If you do have throw rugs, attach them to the floor with carpet tape.  Make sure that you have a light switch at the top of the stairs and the bottom of the stairs. If you do not have them, ask someone to add them for you. WHAT ELSE CAN I DO TO HELP PREVENT FALLS?  Wear shoes that:  Do not have high heels.  Have rubber bottoms.  Are comfortable and fit you well.  Are closed at the toe. Do not wear sandals.  If you use a stepladder:  Make sure that it is fully opened. Do not climb a closed stepladder.  Make sure that both sides of the stepladder are locked into place.  Ask someone to hold it for you, if possible.  Clearly mark and make sure that you can see:  Any grab bars or handrails.  First and last steps.  Where the edge of each step is.  Use tools that help you move around (mobility aids) if they are needed. These include:  Canes.  Walkers.  Scooters.  Crutches.  Turn on the lights when you go into a dark area. Replace any light bulbs as soon as they burn out.  Set up your furniture so you have a clear path. Avoid moving your furniture around.  If any of your floors are uneven, fix them.  If there are any pets around you, be aware of where they are.  Review your medicines with your doctor. Some medicines can make you feel dizzy. This can increase your chance of falling. Ask your doctor what other things that you can do to help prevent falls.   This information is not intended to replace advice given to you by your health care provider. Make sure you discuss any questions you have with your health care provider.   Document Released: 08/14/2009 Document Revised: 03/04/2015 Document Reviewed: 11/22/2014 Elsevier Interactive Patient Education 2016 [place] Maintenance, Male A  healthy lifestyle and preventative care can promote health and wellness.  Maintain regular health, dental, and eye exams.  Eat a healthy diet. Foods like vegetables, fruits, whole grains, low-fat dairy products, and lean protein foods contain the nutrients you need and are low in calories. Decrease your intake of foods high in solid fats, added  sugars, and salt. Get information about a proper diet from your health care provider, if necessary.  Regular physical exercise is one of the most important things you can do for your health. Most adults should get at least 150 minutes of moderate-intensity exercise (any activity that increases your heart rate and causes you to sweat) each week. In addition, most adults need muscle-strengthening exercises on 2 or more days a week.   Maintain a healthy weight. The body mass index (BMI) is a screening tool to identify possible weight problems. It provides an estimate of body fat based on height and weight. Your health care provider can find your BMI and can help you achieve or maintain a healthy weight. For males 20 years and older:  A BMI below 18.5 is considered underweight.  A BMI of 18.5 to 24.9 is normal.  A BMI of 25 to 29.9 is considered overweight.  A BMI of 30 and above is considered obese.  Maintain normal blood lipids and cholesterol by exercising and minimizing your intake of saturated fat. Eat a balanced diet with plenty of fruits and vegetables. Blood tests for lipids and cholesterol should begin at age 85 and be repeated every 5 years. If your lipid or cholesterol levels are high, you are over age 12, or you are at high risk for heart disease, you may need your cholesterol levels checked more frequently.Ongoing high lipid and cholesterol levels should be treated with medicines if diet and exercise are not working.  If you smoke, find out from your health care provider how to quit. If you do not use tobacco, do not start.  Lung cancer  screening is recommended for adults aged 57-80 years who are at high risk for developing lung cancer because of a history of smoking. A yearly low-dose CT scan of the lungs is recommended for people who have at least a 30-pack-year history of smoking and are current smokers or have quit within the past 15 years. A pack year of smoking is smoking an average of 1 pack of cigarettes a day for 1 year (for example, a 30-pack-year history of smoking could mean smoking 1 pack a day for 30 years or 2 packs a day for 15 years). Yearly screening should continue until the smoker has stopped smoking for at least 15 years. Yearly screening should be stopped for people who develop a health problem that would prevent them from having lung cancer treatment.  If you choose to drink alcohol, do not have more than 2 drinks per day. One drink is considered to be 12 oz (360 mL) of beer, 5 oz (150 mL) of wine, or 1.5 oz (45 mL) of liquor.  Avoid the use of street drugs. Do not share needles with anyone. Ask for help if you need support or instructions about stopping the use of drugs.  High blood pressure causes heart disease and increases the risk of stroke. High blood pressure is more likely to develop in:  People who have blood pressure in the end of the normal range (100-139/85-89 mm Hg).  People who are overweight or obese.  People who are African American.  If you are 20-42 years of age, have your blood pressure checked every 3-5 years. If you are 40 years of age or older, have your blood pressure checked every year. You should have your blood pressure measured twice--once when you are at a hospital or clinic, and once when you are not at a hospital or clinic. Record the  average of the two measurements. To check your blood pressure when you are not at a hospital or clinic, you can use:  An automated blood pressure machine at a pharmacy.  A home blood pressure monitor.  If you are 87-54 years old, ask your health  care provider if you should take aspirin to prevent heart disease.  Diabetes screening involves taking a blood sample to check your fasting blood sugar level. This should be done once every 3 years after age 27 if you are at a normal weight and without risk factors for diabetes. Testing should be considered at a younger age or be carried out more frequently if you are overweight and have at least 1 risk factor for diabetes.  Colorectal cancer can be detected and often prevented. Most routine colorectal cancer screening begins at the age of 18 and continues through age 51. However, your health care provider may recommend screening at an earlier age if you have risk factors for colon cancer. On a yearly basis, your health care provider may provide home test kits to check for hidden blood in the stool. A small camera at the end of a tube may be used to directly examine the colon (sigmoidoscopy or colonoscopy) to detect the earliest forms of colorectal cancer. Talk to your health care provider about this at age 10 when routine screening begins. A direct exam of the colon should be repeated every 5-10 years through age 47, unless early forms of precancerous polyps or small growths are found.  People who are at an increased risk for hepatitis B should be screened for this virus. You are considered at high risk for hepatitis B if:  You were born in a country where hepatitis B occurs often. Talk with your health care provider about which countries are considered high risk.  Your parents were born in a high-risk country and you have not received a shot to protect against hepatitis B (hepatitis B vaccine).  You have HIV or AIDS.  You use needles to inject street drugs.  You live with, or have sex with, someone who has hepatitis B.  You are a man who has sex with other men (MSM).  You get hemodialysis treatment.  You take certain medicines for conditions like cancer, organ transplantation, and autoimmune  conditions.  Hepatitis C blood testing is recommended for all people born from [birth year] through 1964 and any individual with known risk factors for hepatitis C.  Healthy men should no longer receive prostate-specific antigen (PSA) blood tests as part of routine cancer screening. Talk to your health care provider about prostate cancer screening.  Testicular cancer screening is not recommended for adolescents or adult males who have no symptoms. Screening includes self-exam, a health care provider exam, and other screening tests. Consult with your health care provider about any symptoms you have or any concerns you have about testicular cancer.  Practice safe sex. Use condoms and avoid high-risk sexual practices to reduce the spread of sexually transmitted infections (STIs).  You should be screened for STIs, including gonorrhea and chlamydia if:  You are sexually active and are younger than 24 years.  You are older than 24 years, and your health care provider tells you that you are at risk for this type of infection.  Your sexual activity has changed since you were last screened, and you are at an increased risk for chlamydia or gonorrhea. Ask your health care provider if you are at risk.  If you are at risk  of being infected with HIV, it is recommended that you take a prescription medicine daily to prevent HIV infection. This is called pre-exposure prophylaxis (PrEP). You are considered at risk if:  You are a man who has sex with other men (MSM).  You are a heterosexual man who is sexually active with multiple partners.  You take drugs by injection.  You are sexually active with a partner who has HIV.  Talk with your health care provider about whether you are at high risk of being infected with HIV. If you choose to begin PrEP, you should first be tested for HIV. You should then be tested every 3 months for as long as you are taking PrEP.  Use sunscreen. Apply sunscreen liberally and  repeatedly throughout the day. You should seek shade when your shadow is shorter than you. Protect yourself by wearing long sleeves, pants, a wide-brimmed hat, and sunglasses year round whenever you are outdoors.  Tell your health care provider of new moles or changes in moles, especially if there is a change in shape or color. Also, tell your health care provider if a mole is larger than the size of a pencil eraser.  A one-time screening for abdominal aortic aneurysm (AAA) and surgical repair of large AAAs by ultrasound is recommended for men aged 80-75 years who are current or former smokers.  Stay current with your vaccines (immunizations).   This information is not intended to replace advice given to you by your health care provider. Make sure you discuss any questions you have with your health care provider.   Document Released: 04/15/2008 Document Revised: 11/08/2014 Document Reviewed: 03/15/2011 Elsevier Interactive Patient Education [employer].

## 2016-03-08 NOTE — Addendum Note (Signed)
Addended byWynetta Fines on: 03/08/2016 03:03 PM   Modules accepted: Orders

## 2016-03-08 NOTE — Telephone Encounter (Signed)
Patients letter has been faxed to dentist office.

## 2016-03-08 NOTE — Progress Notes (Addendum)
Subjective:   Brian Mays is a 70 y.o. male who presents for Medicare Annual/Subsequent preventive examination.  Review of Systems:   HRA assessment completed during visit; [place], [place]  The Patient was informed that this wellness visit is to identify risk and educate on how to reduce risk for increase disease through lifestyle changes.   ROS deferred to CPE exam with physician  Family and medical hx given below;   Metastatic low grade neuroendocrine tumor-hepatic metastases/ Treated Nov 2016 Mild MI recently; Dr. Einar Gip  Cancer started in 2010; radioactive seed procedures has stopped growth;  Dr. Martha Clan treating   Recent c/o of chest pain; seen by Dr. Edilia Bo and found to have had "mild MI' per the patient. Going to cardiac rehab post 0411 coronary angiography/  Lifestyle review:  HTN; states BP 128/ 70 at Dentist this am Metastatic CA/ under treatment  Tobacco: former smoker/ quit 1999; smoked 38 years;  Secondary smoke? No  ETOH: 3 to 4 beers per week   Medication review/ Adherent States the creon is very expensive; Recommended he try Rx assistance;  www.helprx.info;  https://www.creon.com/on-course for pharmaceutical assistance    BMI: 28 Diet; has been good  Doing the best they can with remodeling underway in the home Re-flooring Eats cereal for breakfast;  Cheese sandwich for lunch Cooked meal for dinner;  States he needs to eat more fruit; eat more green leafy vegetable;  Recommended he try smoothie on occasion to get greens in as he doesn't like many greens  Exercise; plays golf; Basketball;  Hasn't exercised due to addition to shed he is building at home; Make more woodworking space in shop which is something he would like to do Play golf some; [facility] [employer] and [facility];   [place]; one level  No tub; has shower only  Railing as needed for stairs; or in bathroom Community safety; YES  Smoke detectors yes Firearms safety reviewed and  will keep in a safe place if these exist. Driving accidents; no;  Uses sun protection or wears hat when out  Stressors (1-5) 2;   Depression: Denies feeling depressed or hopeless; voices pleasure in daily life Mood stable; no issue voiced; affect was appropriate  Cognitive; Presents with no issues;  Engaged in assessment Manages checkbook, medications; no failures of task States Memory is not as good as it was  Fall assessment; no  Gait assessment appears normal;   Mobilization and Functional losses from last year to this year? no  Sleep pattern changes; good   Urinary or fecal incontinence reviewed/ no    Counseling Health Maintenance Colonoscopy; 12/2015; check in 10 years;  EKG: 01/2016   Hearing: hearing is an issue  Div of Hard of hearing in [place]; for assistance for hearing aid  States he was declined [place] benefits; Given information on [place] and to keep in touch as sometimes criteria changes;   Ophthalmology exam; annual  Immunizations Due: Zoster; referred to cancer doctor Advanced Directive; YES  Health Recommendations and Referrals To try [facility] again for [facility] or hearing exam  States [facility] has declined assistance with medication; Educated on Part B and Part D benefit; Paying over 1200.00 a month for injections and will check on oop left on medical and Part D side; He is not in the doughnut but may be next month; depending on cost of injections and where it applies in his plan.  Current Care Team reviewed and updated [place] in [place]; To call and continue  to inquire;  Address: [address] [place], [place], [place] [address] [phone] Cardiac Risk Factors include: advanced age (>49men, >16 women);dyslipidemia;family history of premature cardiovascular disease;hypertension     Objective:    Vitals: BP 128/70 mmHg  Ht 5\' 10"  (1.778 m)  Wt 201 lb 12 oz (91.513 kg)  BMI 28.95 kg/m2  Body mass index is 28.95 kg/(m^2).  Tobacco History  Smoking  status  . Former Smoker -- 1.50 packs/day for 35 years  . Types: Cigarettes  . Start date: 09/25/1959  . Quit date: 12/21/1997  Smokeless tobacco  . Never Used     Counseling given: Not Answered   Past Medical History  Diagnosis Date  . GERD (gastroesophageal reflux disease)   . Pancreatitis 1998    chronic  . HTN (hypertension)   . Hemorrhoid   . Hypothyroidism   . Colon polyps   . Diverticulosis   . Gallstones   . Hiatal hernia   . Heart murmur   . Anemia     in past  . Metastatic carcinoma [facility]) 2010    Dr Jonette Eva   Past Surgical History  Procedure Laterality Date  . Tumor excision  01/2009    Carcinoil Resection   . Liver biopsy  2010  . Tumor removal      from small intestine  . Laparoscopic cholecystectomy  1999  . Radioactive seed implant  X 3    "to my liver"  . Cataract extraction w/ intraocular lens  implant, bilateral Bilateral   . Appendectomy  1962  . Cardiac catheterization N/A 02/10/2016    Procedure: Left Heart Cath and Coronary Angiography;  Surgeon: Adrian Prows, MD;  Location: [facility];  Service: Cardiovascular;  Laterality: N/A;  . Cardiac catheterization  02/10/2016    Procedure: Coronary/Graft Atherectomy;  Surgeon: Adrian Prows, MD;  Location: [facility];  Service: Cardiovascular;;  . Cardiac catheterization  02/10/2016    Procedure: Coronary Stent Intervention;  Surgeon: Adrian Prows, MD;  Location: [facility];  Service: Cardiovascular;;   Family History  Problem Relation Age of Onset  . Kidney disease Mother   . Hyperlipidemia Mother   . Hypertension Mother   . COPD Father   . Ulcerative colitis Daughter    History  Sexual Activity  . Sexual Activity: Yes    Outpatient Encounter Prescriptions as of 03/08/2016  Medication Sig  . amLODipine (NORVASC) 5 MG tablet TAKE 1 TABLET DAILY  . aspirin EC 81 MG tablet Take 81 mg by mouth every morning.  [name] atorvastatin (LIPITOR) 80 MG tablet Take 1 tablet (80 mg total) by mouth  daily at 6 PM.  . Cholecalciferol 1000 UNITS tablet Take 1,000 Units by mouth daily.    [name] CREON 24000 units CPEP Take 24,000 Units by mouth 3 (three) times daily with meals.   . diphenhydrAMINE (BENADRYL) 50 MG tablet Take 1 tablet (50 mg total) by mouth once. Take one hour before study  . fluticasone (FLONASE) 50 MCG/ACT nasal spray Place 2 sprays into both nostrils daily as needed (sinuses).   . hydrocortisone (ANUSOL-HC) 25 MG suppository INSERT 1 SUPPOSITORY RECTALLY TWICE A DAY AS NEEDED FOR HEMORRHOIDS  . Lanreotide Acetate (SOMATULINE DEPOT [place]) Inject 120 mcg into the skin every 28 (twenty-eight) days. Receives at Dr Antonieta Pert office  . lansoprazole (PREVACID) 30 MG capsule Take 1 capsule (30 mg total) by mouth daily at 12 noon.  [name] levothyroxine (SYNTHROID, LEVOTHROID) 50 MCG tablet TAKE 1 TABLET  DAILY.  . losartan (COZAAR) 100 MG tablet TAKE 1 TABLET BY MOUTH EVERY MORNING  . metoCLOPramide (REGLAN) 10 MG tablet Take 10 mg by mouth daily.  . metoprolol tartrate (LOPRESSOR) 25 MG tablet Take 1 tablet (25 mg total) by mouth 2 (two) times daily.  . nitroGLYCERIN (NITROSTAT) 0.4 MG SL tablet Place 1 tablet (0.4 mg total) under the tongue every 5 (five) minutes x 3 doses as needed for chest pain.  . Omega-3 Fatty Acids (FISH OIL) 1000 MG CAPS Take 1 capsule by mouth daily. Reported on 12/30/2015  . PARoxetine (PAXIL) 10 MG tablet Take 1 tablet (10 mg total) by mouth daily.  . predniSONE (DELTASONE) 50 MG tablet Take 50mg  at 13 hours, 7 hours, and 1 hour before scan  . sodium chloride (OCEAN) 0.65 % SOLN nasal spray Place 1 spray into both nostrils as needed for congestion.   . sucralfate (CARAFATE) 1 g tablet Take 1 tablet (1 g total) by mouth 4 (four) times daily -  with meals and at bedtime.  . ticagrelor (BRILINTA) 90 MG TABS tablet Take 1 tablet (90 mg total) by mouth 2 (two) times daily.  . vitamin B-12 (CYANOCOBALAMIN) 1000 MCG tablet Take 1,000 mcg by mouth daily.  . sucralfate  (CARAFATE) 1 g tablet Take 1 g by mouth 4 (four) times daily.   Facility-Administered Encounter Medications as of 03/08/2016  Medication  . gi cocktail (Maalox,Lidocaine,Donnatal)    Activities of Daily Living In your present state of health, do you have any difficulty performing the following activities: 03/08/2016 02/09/2016  Hearing? [place]? N -  Difficulty concentrating or making decisions? N -  Walking or climbing stairs? N -  Dressing or bathing? N -  Doing errands, shopping? N N  Preparing Food and eating ? N -  Using the Toilet? N -  In the past six months, have you accidently leaked urine? N -  Do you have problems with loss of bowel control? N -  Managing your Medications? N -  Managing your Finances? N -  Housekeeping or managing your Housekeeping? N -    Patient Care Team: Cassandria Anger, MD as PCP - General (Internal Medicine) Volanda Napoleon, MD (Internal Medicine) Irene Shipper, MD (Gastroenterology)   Assessment:     Exercise Activities and Dietary recommendations Current Exercise Habits: Home exercise routine, Intensity: Moderate  Goals    . patient     Will start eating more fruit Can try smoothies;       Fall Risk Fall Risk  03/08/2016 01/16/2016 12/19/2015 11/17/2015 10/13/2015  Falls in the past year? No No No No No   Depression Screen PHQ 2/9 Scores 03/08/2016 11/14/2013  PHQ - 2 Score 0 0    Cognitive Testing No flowsheet data found.  Immunization History  Administered Date(s) Administered  . H1N1 10/04/2008  . Influenza Split 09/30/2011, 08/04/2012  . Influenza,inj,Quad PF,36+ Mos 09/28/2013, 07/26/2014, 07/14/2015  . Pneumococcal Conjugate-13 03/06/2015  . Pneumococcal Polysaccharide-23 03/30/2011  . Td 03/06/2015   Screening Tests Health Maintenance  Topic Date Due  . Hepatitis C Screening  [birth year]  . ZOSTAVAX  02/13/2007  . PNA vac Low Risk Adult (2 of 2 - PPSV23) 03/29/2016  . INFLUENZA VACCINE  06/01/2016  . TETANUS/TDAP   03/05/2025  . COLONOSCOPY  12/08/2025      Plan:     Medication review/ Adherence good; needs help with Creon  www.helprx.info;  https://www.creon.com/on-course   Deaf & Hard of Hearing  Division Services / helps with hearing aid No reviews  [facility]  [phone]  [phone]  Call [employer] (customer service)  and inquire as to what the Out of pocket maxium is on the part B side (medical)  of the benefit and the Part D (medicine)  side    During the course of the visit the patient was educated and counseled about the following appropriate screening and preventive services:   Vaccines to include Pneumoccal, Influenza, Hepatitis B, Td, Zostavax, HCV  Educated regarding shingles; deferred to oncologist  Electrocardiogram/recent hospitalization for treatment chest pain  Cardiovascular Disease/ hx of smoking; exercises;   Colorectal cancer screening/ 12/2025  Diabetes screening/ neg  Glucose 94  Prostate Cancer Screening/ deferred  Glaucoma screening/ neg  Nutrition counseling / add more fruit   Smoking cessation counseling/ quit 35'   Patient Instructions (the written plan) was given to the patient.    [id number], RN  03/08/2016   Medical screening examination/treatment/procedure(s) were performed by non-physician practitioner and as supervising physician I was immediately available for consultation/collaboration. I agree with above. Walker Kehr, MD

## 2016-03-11 ENCOUNTER — Encounter (HOSPITAL_COMMUNITY)
Admission: RE | Admit: 2016-03-11 | Discharge: 2016-03-11 | Disposition: A | Discharge status: Home / Self Care | Payer: Medicare PPO | Source: Ambulatory Visit | Attending: Cardiology | Admitting: Cardiology

## 2016-03-11 VITALS — BP 122/70 | HR 59 | Ht 70.0 in | Wt 201.3 lb

## 2016-03-11 DIAGNOSIS — Z955 Presence of coronary angioplasty implant and graft: Secondary | ICD-10-CM | POA: Diagnosis present

## 2016-03-11 DIAGNOSIS — I214 Non-ST elevation (NSTEMI) myocardial infarction: Secondary | ICD-10-CM | POA: Diagnosis present

## 2016-03-11 DIAGNOSIS — I10 Essential (primary) hypertension: Secondary | ICD-10-CM | POA: Diagnosis not present

## 2016-03-11 DIAGNOSIS — Z79899 Other long term (current) drug therapy: Secondary | ICD-10-CM | POA: Diagnosis not present

## 2016-03-11 DIAGNOSIS — Z87891 Personal history of nicotine dependence: Secondary | ICD-10-CM | POA: Diagnosis not present

## 2016-03-11 DIAGNOSIS — E039 Hypothyroidism, unspecified: Secondary | ICD-10-CM | POA: Insufficient documentation

## 2016-03-11 DIAGNOSIS — K219 Gastro-esophageal reflux disease without esophagitis: Secondary | ICD-10-CM | POA: Diagnosis not present

## 2016-03-11 NOTE — Progress Notes (Signed)
Cardiac Rehab Medication Review by a Pharmacist  Does the patient  feel that his/her medications are working for him/her?  yes  Has the patient been experiencing any side effects to the medications prescribed?  Yes, does have occasional leg cramps  Does the patient measure his/her own blood pressure or blood glucose at home?  no   Does the patient have any problems obtaining medications due to transportation or finances?   His Creon and monthly injection of Somatuline are expensive before he meets his deductible and when he is in the donut hole.   Understanding of regimen: good Understanding of indications: good Potential of compliance: good    Pharmacist comments: 69 YOM presenting for CR today.  He reports adherence to his medications.  He states that he does have occasional leg cramps but be believes this is due to his injection of Somatuline monthly.     Lowella Grip E Combs 03/11/2016 8:09 AM

## 2016-03-11 NOTE — Progress Notes (Signed)
Cardiac Individual Treatment Plan  Patient Details  Name: Brian Mays MRN: [id number] Date of Birth: 19-Sep-1946 Referring Provider:        CARDIAC REHAB PHASE II ORIENTATION from 03/11/2016 in [place]   Referring Provider  Kela Millin MD      Initial Encounter Date:       CARDIAC REHAB PHASE II ORIENTATION from 03/11/2016 in [place]   Date  03/11/16   Referring Provider  Kela Millin MD      Visit Diagnosis: NSTEMI (non-ST elevated myocardial infarction) [facility])  Stented coronary artery  Patient's Home Medications on Admission:  Current outpatient prescriptions:  .  amLODipine (NORVASC) 5 MG tablet, TAKE 1 TABLET DAILY, Disp: 30 tablet, Rfl: 11 .  aspirin EC 81 MG tablet, Take 81 mg by mouth every morning., Disp: , Rfl:  .  atorvastatin (LIPITOR) 80 MG tablet, Take 1 tablet (80 mg total) by mouth daily at 6 PM., Disp: 30 tablet, Rfl: 1 .  Cholecalciferol 1000 UNITS tablet, Take 1,000 Units by mouth daily.  , Disp: , Rfl:  .  CREON 24000 units CPEP, Take 24,000 Units by mouth 3 (three) times daily with meals. , Disp: , Rfl:  .  diphenhydrAMINE (BENADRYL) 50 MG tablet, Take 1 tablet (50 mg total) by mouth once. Take one hour before study, Disp: 2 tablet, Rfl: 0 .  fluticasone (FLONASE) 50 MCG/ACT nasal spray, Place 2 sprays into both nostrils daily as needed (sinuses). , Disp: , Rfl: 11 .  hydrocortisone (ANUSOL-HC) 25 MG suppository, INSERT 1 SUPPOSITORY RECTALLY TWICE A DAY AS NEEDED FOR HEMORRHOIDS, Disp: 30 suppository, Rfl: 0 .  Lanreotide Acetate (SOMATULINE DEPOT [place]), Inject 120 mcg into the skin every 28 (twenty-eight) days. Receives at Dr Antonieta Pert office, Disp: , Rfl:  .  lansoprazole (PREVACID) 30 MG capsule, Take 1 capsule (30 mg total) by mouth daily at 12 noon., Disp: 90 capsule, Rfl: 2 .  levothyroxine (SYNTHROID, LEVOTHROID) 50 MCG tablet, TAKE 1 TABLET DAILY., Disp: 90 tablet, Rfl: 2 .   losartan (COZAAR) 100 MG tablet, TAKE 1 TABLET BY MOUTH EVERY MORNING, Disp: 90 tablet, Rfl: 3 .  metoCLOPramide (REGLAN) 10 MG tablet, Take 10 mg by mouth daily., Disp: , Rfl:  .  metoprolol tartrate (LOPRESSOR) 25 MG tablet, Take 1 tablet (25 mg total) by mouth 2 (two) times daily., Disp: 60 tablet, Rfl: 1 .  nitroGLYCERIN (NITROSTAT) 0.4 MG SL tablet, Place 1 tablet (0.4 mg total) under the tongue every 5 (five) minutes x 3 doses as needed for chest pain., Disp: 25 tablet, Rfl: 4 .  Omega-3 Fatty Acids (FISH OIL) 1000 MG CAPS, Take 1 capsule by mouth daily. Reported on 12/30/2015, Disp: , Rfl:  .  PARoxetine (PAXIL) 10 MG tablet, Take 1 tablet (10 mg total) by mouth daily., Disp: 90 tablet, Rfl: 3 .  predniSONE (DELTASONE) 50 MG tablet, Take 50mg  at 13 hours, 7 hours, and 1 hour before scan, Disp: 3 tablet, Rfl: 0 .  sodium chloride (OCEAN) 0.65 % SOLN nasal spray, Place 1 spray into both nostrils as needed for congestion. , Disp: , Rfl:  .  ticagrelor (BRILINTA) 90 MG TABS tablet, Take 1 tablet (90 mg total) by mouth 2 (two) times daily., Disp: 60 tablet, Rfl: 0 .  vitamin B-12 (CYANOCOBALAMIN) 1000 MCG tablet, Take 1,000 mcg by mouth daily., Disp: , Rfl:   Past Medical History: Past Medical History  Diagnosis Date  . GERD (  gastroesophageal reflux disease)   . Pancreatitis 1998    chronic  . HTN (hypertension)   . Hemorrhoid   . Hypothyroidism   . Colon polyps   . Diverticulosis   . Gallstones   . Hiatal hernia   . Heart murmur   . Anemia     in past  . Metastatic carcinoma ([place]) 2010    Dr Jonette Eva    Tobacco Use: History  Smoking status  . Former Smoker -- 1.50 packs/day for 35 years  . Types: Cigarettes  . Start date: 09/25/1959  . Quit date: 12/21/1997  Smokeless tobacco  . Never Used    Labs: Recent Review Flowsheet Data    Labs for ITP Cardiac and Pulmonary Rehab Latest Ref Rng 06/16/2009 04/19/2011 04/03/2012 03/07/2015 02/09/2016   Cholestrol 0 - 200 mg/dL 139 - 123 164  147   LDLCALC 0 - 99 mg/dL 82 - 76 100(H) 100(H)   HDL >40 mg/dL 29.70(L) - 26.80(L) 44.60 35(L)   Trlycerides <150 mg/dL 137.0 - 101.0 95.0 59   Hemoglobin A1c 4.6 - 6.5 % - 5.9 5.7 5.8 -      Capillary Blood Glucose: Lab Results  Component Value Date   GLUCAP 100* 05/20/2011   GLUCAP 110* 12/01/2010     Exercise Target Goals: Date: 03/11/16  Exercise Program Goal: Individual exercise prescription set with THRR, safety & activity barriers. Participant demonstrates ability to understand and report RPE using BORG scale, to self-measure pulse accurately, and to acknowledge the importance of the exercise prescription.  Exercise Prescription Goal: Starting with aerobic activity 30 plus minutes a day, 3 days per week for initial exercise prescription. Provide home exercise prescription and guidelines that participant acknowledges understanding prior to discharge.  Activity Barriers & Risk Stratification:     Activity Barriers & Cardiac Risk Stratification - 03/11/16 0854    Activity Barriers & Cardiac Risk Stratification   Activity Barriers Back Problems;Other (comment)   Comments Ocassional dizziness   Cardiac Risk Stratification High      6 Minute Walk:     6 Minute Walk      03/11/16 0914       6 Minute Walk   Phase Initial     Distance 1500 feet     Walk Time 6 minutes     # of Rest Breaks 0     MPH 2.84     METS 2.98     RPE 11     VO2 Peak 10.44     Symptoms No     Resting HR 59 bpm     Resting BP 122/70 mmHg     Max Ex. HR 75 bpm     Max Ex. BP 124/72 mmHg     2 Minute Post BP 124/62 mmHg        Initial Exercise Prescription:     Initial Exercise Prescription - 03/11/16 0900    Date of Initial Exercise RX and Referring Provider   Date 03/11/16   Referring Provider Kela Millin MD   Treadmill   MPH 2.1   Grade 1   Minutes 10   METs 2.9   Bike   Level 0.7   Minutes 10   METs 2.9   NuStep   Level 2   Minutes 10   METs 2    Prescription Details   Frequency (times per week) 3   Duration Progress to 30 minutes of continuous aerobic without signs/symptoms of physical distress   Intensity  THRR 40-80% of Max Heartrate 60-121   Ratings of Perceived Exertion 11-13   Progression   Progression Continue to progress workloads to maintain intensity without signs/symptoms of physical distress.   Resistance Training   Training Prescription Yes   Weight 2 lbs   Reps 10-12      Perform Capillary Blood Glucose checks as needed.  Exercise Prescription Changes:   Exercise Comments:   Discharge Exercise Prescription (Final Exercise Prescription Changes):   Nutrition:  Target Goals: Understanding of nutrition guidelines, daily intake of sodium 1500mg , cholesterol 200mg , calories 30% from fat and 7% or less from saturated fats, daily to have 5 or more servings of fruits and vegetables.  Biometrics:     Pre Biometrics - 03/11/16 0855    Pre Biometrics   Waist Circumference 42.5 inches   Hip Circumference 41 inches   Waist to Hip Ratio 1.04 %   Triceps Skinfold 19 mm   % Body Fat 30 %   Grip Strength 54 kg   Flexibility 0 in   Single Leg Stand 8.84 seconds       Nutrition Therapy Plan and Nutrition Goals:     Nutrition Therapy & Goals - 03/11/16 1115    Nutrition Therapy   Diet Therapeutic Lifestyle Changes   Personal Nutrition Goals   Personal Goal #1 0.5-2 lb wt loss per week to a goal wt loss of 6-20 lb at graduation from [place], educate and counsel regarding individualized specific dietary modifications aiming towards targeted core components such as weight, hypertension, lipid management, diabetes, heart failure and other comorbidities.   Expected Outcomes Short Term Goal: Understand basic principles of dietary content, such as calories, fat, sodium, cholesterol and nutrients.;Long Term Goal: Adherence to prescribed nutrition plan.       Nutrition Discharge: Nutrition Scores:   Nutrition Goals Re-Evaluation:   Psychosocial: Target Goals: Acknowledge presence or absence of depression, maximize coping skills, provide positive support system. Participant is able to verbalize types and ability to use techniques and skills needed for reducing stress and depression.  Initial Review & Psychosocial Screening:   Quality of Life Scores:     Quality of Life - 03/11/16 0950    Quality of Life Scores   Health/Function Pre 24.53 %   Socioeconomic Pre 27 %   Psych/Spiritual Pre 25.36 %   Family Pre 28 %   GLOBAL Pre 25.72 %      PHQ-9:     Recent Review Flowsheet Data    Depression screen [facility] 2/9 03/08/2016 11/14/2013   Decreased Interest 0 0   Down, Depressed, Hopeless 0 0   PHQ - 2 Score 0 0      Psychosocial Evaluation and Intervention:   Psychosocial Re-Evaluation:   Vocational Rehabilitation: Provide vocational rehab assistance to qualifying candidates.   Vocational Rehab Evaluation & Intervention:   Education: Education Goals: Education classes will be provided on a weekly basis, covering required topics. Participant will state understanding/return demonstration of topics presented.  Learning Barriers/Preferences:     Learning Barriers/Preferences - 03/11/16 0854    Learning Barriers/Preferences   Learning Barriers Sight;Hearing  glasses, bilateral hearing aids   Learning Preferences Skilled Demonstration      Education Topics: Count Your Pulse:  -Group instruction provided by verbal instruction, demonstration, patient participation and written materials to support subject.  Instructors address importance of being able to find your pulse and how to count your pulse when at home without a  heart monitor.  Patients get hands on experience counting their pulse with staff help and individually.   Heart Attack, Angina, and Risk Factor Modification:  -Group instruction provided by verbal  instruction, video, and written materials to support subject.  Instructors address signs and symptoms of angina and heart attacks.    Also discuss risk factors for heart disease and how to make changes to improve heart health risk factors.   Functional Fitness:  -Group instruction provided by verbal instruction, demonstration, patient participation, and written materials to support subject.  Instructors address safety measures for doing things around the house.  Discuss how to get up and down off the floor, how to pick things up properly, how to safely get out of a chair without assistance, and balance training.   Meditation and Mindfulness:  -Group instruction provided by verbal instruction, patient participation, and written materials to support subject.  Instructor addresses importance of mindfulness and meditation practice to help reduce stress and improve awareness.  Instructor also leads participants through a meditation exercise.    Stretching for Flexibility and Mobility:  -Group instruction provided by verbal instruction, patient participation, and written materials to support subject.  Instructors lead participants through series of stretches that are designed to increase flexibility thus improving mobility.  These stretches are additional exercise for major muscle groups that are typically performed during regular warm up and cool down.   Hands Only CPR Anytime:  -Group instruction provided by verbal instruction, video, patient participation and written materials to support subject.  Instructors co-teach with AHA video for hands only CPR.  Participants get hands on experience with mannequins.   Nutrition I class: Heart Healthy Eating:  -Group instruction provided by PowerPoint slides, verbal discussion, and written materials to support subject matter. The instructor gives an explanation and review of the Therapeutic Lifestyle Changes diet recommendations, which includes a discussion on  lipid goals, dietary fat, sodium, fiber, plant stanol/sterol esters, sugar, and the components of a well-balanced, healthy diet.   Nutrition II class: Lifestyle Skills:  -Group instruction provided by PowerPoint slides, verbal discussion, and written materials to support subject matter. The instructor gives an explanation and review of label reading, grocery shopping for heart health, heart healthy recipe modifications, and ways to make healthier choices when eating out.   Diabetes Question & Answer:  -Group instruction provided by PowerPoint slides, verbal discussion, and written materials to support subject matter. The instructor gives an explanation and review of diabetes co-morbidities, pre- and post-prandial blood glucose goals, pre-exercise blood glucose goals, signs, symptoms, and treatment of hypoglycemia and hyperglycemia, and foot care basics.   Diabetes Blitz:  -Group instruction provided by PowerPoint slides, verbal discussion, and written materials to support subject matter. The instructor gives an explanation and review of the physiology behind type 1 and type 2 diabetes, diabetes medications and rational behind using different medications, pre- and post-prandial blood glucose recommendations and Hemoglobin A1c goals, diabetes diet, and exercise including blood glucose guidelines for exercising safely.    Portion Distortion:  -Group instruction provided by PowerPoint slides, verbal discussion, written materials, and food models to support subject matter. The instructor gives an explanation of serving size versus portion size, changes in portions sizes over the last 20 years, and what consists of a serving from each food group.   Stress Management:  -Group instruction provided by verbal instruction, video, and written materials to support subject matter.  Instructors review role of stress in heart disease and how to cope with stress positively.  Exercising on Your Own:  -Group  instruction provided by verbal instruction, power point, and written materials to support subject.  Instructors discuss benefits of exercise, components of exercise, frequency and intensity of exercise, and end points for exercise.  Also discuss use of nitroglycerin and activating EMS.  Review options of places to exercise outside of rehab.  Review guidelines for sex with heart disease.   Cardiac Drugs I:  -Group instruction provided by verbal instruction and written materials to support subject.  Instructor reviews cardiac drug classes: antiplatelets, anticoagulants, beta blockers, and statins.  Instructor discusses reasons, side effects, and lifestyle considerations for each drug class.   Cardiac Drugs II:  -Group instruction provided by verbal instruction and written materials to support subject.  Instructor reviews cardiac drug classes: angiotensin converting enzyme inhibitors (ACE-I), angiotensin II receptor blockers (ARBs), nitrates, and calcium channel blockers.  Instructor discusses reasons, side effects, and lifestyle considerations for each drug class.   Anatomy and Physiology of the Circulatory System:  -Group instruction provided by verbal instruction, video, and written materials to support subject.  Reviews functional anatomy of heart, how it relates to various diagnoses, and what role the heart plays in the overall system.   Knowledge Questionnaire Score:     Knowledge Questionnaire Score - 03/11/16 0945    Knowledge Questionnaire Score   Pre Score 20/24      Core Components/Risk Factors/Patient Goals at Admission:     Personal Goals and Risk Factors at Admission - 03/11/16 0833    Core Components/Risk Factors/Patient Goals on Admission    Weight Management Yes;Weight Loss   Intervention Weight Management/Obesity: Establish reasonable short term and long term weight goals.;Weight Management: Develop a combined nutrition and exercise program designed to reach desired  caloric intake, while maintaining appropriate intake of nutrient and fiber, sodium and fats, and appropriate energy expenditure required for the weight goal.;Weight Management: Provide education and appropriate resources to help participant work on and attain dietary goals.   Admit Weight 201 lb 4.5 oz (91.3 kg)   Goal Weight: Short Term 195 lb (88.451 kg)   Goal Weight: Long Term 190 lb (86.183 kg)   Expected Outcomes Weight Loss: Understanding of general recommendations for a balanced deficit meal plan, which promotes 1-2 lb weight loss per week and includes a negative energy balance of [phone] kcal/d;Understanding recommendations for meals to include 15-35% energy as protein, 25-35% energy from fat, 35-60% energy from carbohydrates, less than 200mg  of dietary cholesterol, 20-35 gm of total fiber daily;Understanding of distribution of calorie intake throughout the day with the consumption of 4-5 meals/snacks;Long Term: Adherence to nutrition and physical activity/exercise program aimed toward attainment of established weight goal;Short Term: Continue to assess and modify interventions until short term weight is achieved   Sedentary Yes   Intervention Develop an individualized exercise prescription for aerobic and resistive training based on initial evaluation findings, risk stratification, comorbidities and participant's personal goals.;Provide advice, education, support and counseling about physical activity/exercise needs.   Expected Outcomes Achievement of increased cardiorespiratory fitness and enhanced flexibility, muscular endurance and strength shown through measurements of functional capacity and personal statement of participant.   Increase Strength and Stamina Yes   Intervention Provide advice, education, support and counseling about physical activity/exercise needs.;Develop an individualized exercise prescription for aerobic and resistive training based on initial evaluation findings, risk  stratification, comorbidities and participant's personal goals.   Expected Outcomes Achievement of increased cardiorespiratory fitness and enhanced flexibility, muscular endurance and strength shown through measurements of functional capacity  and personal statement of participant.   Tobacco Cessation Yes  quit 2002   Hypertension Yes   Intervention Provide education on lifestyle modifcations including regular physical activity/exercise, weight management, moderate sodium restriction and increased consumption of fresh fruit, vegetables, and low fat dairy, alcohol moderation, and smoking cessation.;Monitor prescription use compliance.   Expected Outcomes Short Term: Continued assessment and intervention until BP is < 140/9mm HG in hypertensive participants. < 130/62mm HG in hypertensive participants with diabetes, heart failure or chronic kidney disease.;Long Term: Maintenance of blood pressure at goal levels.   Lipids Yes   Intervention Provide education and support for participant on nutrition & aerobic/resistive exercise along with prescribed medications to achieve LDL 70mg , HDL >40mg .   Expected Outcomes Short Term: Participant states understanding of desired cholesterol values and is compliant with medications prescribed. Participant is following exercise prescription and nutrition guidelines.;Long Term: Cholesterol controlled with medications as prescribed, with individualized exercise RX and with personalized nutrition plan. Value goals: LDL < 70mg , HDL > 40 mg.   Personal Goal Other Yes   Personal Goal short: Learn limitations and get back to normal long: increased flexibility, balance and increase energy   Intervention provide education on exercising safely,increased exercise capacity,  and increase knowledge/awareness on CVD,    Expected Outcomes able to exercise safely on own and get back to normal activities and improved balance and flexibilty      Core Components/Risk Factors/Patient  Goals Review:    Core Components/Risk Factors/Patient Goals at Discharge (Final Review):    ITP Comments:     ITP Comments      03/11/16 0816 03/11/16 0853         ITP Comments Dr. Fransico Him, Medical Director Dr. Fransico Him, Medical Director         Comments:  Pt in today for cardiac rehab orientation from 0800-1000.  As a part of the orientation appointment, pt completed 6 minute exercise walk test.  Pt tolerated well with no complaints.  Monitor showed SR with no noted ectopy.  Compared to most recent ekg- 02/17/16 Pt to return next week for full exercise. Cherre Huger, BSN

## 2016-03-15 ENCOUNTER — Encounter (HOSPITAL_COMMUNITY): Payer: Self-pay

## 2016-03-15 ENCOUNTER — Encounter (HOSPITAL_COMMUNITY)
Admission: RE | Admit: 2016-03-15 | Discharge: 2016-03-15 | Disposition: A | Discharge status: Home / Self Care | Payer: Medicare PPO | Source: Ambulatory Visit | Attending: Cardiology | Admitting: Cardiology

## 2016-03-15 DIAGNOSIS — I214 Non-ST elevation (NSTEMI) myocardial infarction: Secondary | ICD-10-CM | POA: Diagnosis not present

## 2016-03-15 DIAGNOSIS — Z955 Presence of coronary angioplasty implant and graft: Secondary | ICD-10-CM

## 2016-03-15 NOTE — Progress Notes (Addendum)
Daily Session Note  Patient Details  Name: Brian Mays MRN: [id number] Date of Birth: 11-23-1945 Referring Provider:        [place] from 03/11/2016 in [place]   Referring Provider  Kela Millin MD      Encounter Date: 03/15/2016  Check In:     Session Check In - 03/15/16 0843    Check-In   Location [facility]   Staff Present Su Hilt, MS, ACSM RCEP, Exercise Physiologist;Amber Fair, MS, ACSM RCEP, Exercise Physiologist;Joann Rion, RN, Luisa Hart, RN, Marga Melnick, RN, BSN   Supervising physician immediately available to respond to emergencies [facility] immediately available   Physician(s) Dr. Marily Memos   Medication changes reported     No   Fall or balance concerns reported    No   Warm-up and Cool-down Performed as group-led instruction   Resistance Training Performed Yes   VAD Patient? No   Pain Assessment   Currently in Pain? No/denies   Multiple Pain Sites No      Capillary Blood Glucose: No results found for this or any previous visit (from the past 24 hour(s)).   Goals Met:  Exercise tolerated well  Goals Unmet:  Not Applicable   Pt started cardiac rehab today.  Pt tolerated light exercise without difficulty. VSS, telemetry-sinus , asymptomatic.  Medication list reconciled. Pt denies barriers to medicaiton compliance.  PSYCHOSOCIAL ASSESSMENT:  PHQ-0. Pt exhibits positive coping skills, hopeful outlook with supportive family. No psychosocial needs identified at this time, no psychosocial interventions necessary.    Pt enjoys playing golf, darts, biking and walking.  Pt also plays basketball at [employer] [facility].   Pt oriented to exercise equipment and routine.    Understanding verbalized.   Comments:  Dr. Fransico Him is Medical Director for Cardiac Rehab at [facility].

## 2016-03-17 ENCOUNTER — Encounter (HOSPITAL_COMMUNITY)
Admission: RE | Admit: 2016-03-17 | Discharge: 2016-03-17 | Disposition: A | Discharge status: Home / Self Care | Payer: Medicare PPO | Source: Ambulatory Visit | Attending: Cardiology | Admitting: Cardiology

## 2016-03-17 ENCOUNTER — Other Ambulatory Visit: Payer: Self-pay | Admitting: *Deleted

## 2016-03-17 DIAGNOSIS — I214 Non-ST elevation (NSTEMI) myocardial infarction: Secondary | ICD-10-CM | POA: Diagnosis not present

## 2016-03-17 DIAGNOSIS — Z955 Presence of coronary angioplasty implant and graft: Secondary | ICD-10-CM

## 2016-03-17 MED ORDER — LOSARTAN POTASSIUM 100 MG PO TABS: 100.0000 mg | ORAL_TABLET | Freq: Every morning | ORAL | Status: DC

## 2016-03-19 ENCOUNTER — Encounter (HOSPITAL_COMMUNITY)
Admission: RE | Admit: 2016-03-19 | Discharge: 2016-03-19 | Disposition: A | Discharge status: Home / Self Care | Payer: Medicare PPO | Source: Ambulatory Visit | Attending: Cardiology | Admitting: Cardiology

## 2016-03-19 ENCOUNTER — Ambulatory Visit (HOSPITAL_BASED_OUTPATIENT_CLINIC_OR_DEPARTMENT_OTHER): Payer: Medicare PPO | Admitting: Family

## 2016-03-19 ENCOUNTER — Ambulatory Visit (HOSPITAL_BASED_OUTPATIENT_CLINIC_OR_DEPARTMENT_OTHER): Payer: Medicare PPO

## 2016-03-19 ENCOUNTER — Other Ambulatory Visit (HOSPITAL_BASED_OUTPATIENT_CLINIC_OR_DEPARTMENT_OTHER): Payer: Medicare PPO

## 2016-03-19 ENCOUNTER — Encounter: Payer: Self-pay | Admitting: Family

## 2016-03-19 VITALS — BP 129/71 | HR 53 | Temp 98.3°F | Resp 14 | Ht 70.0 in | Wt 198.0 lb

## 2016-03-19 DIAGNOSIS — C7B8 Other secondary neuroendocrine tumors: Secondary | ICD-10-CM

## 2016-03-19 DIAGNOSIS — C7A8 Other malignant neuroendocrine tumors: Secondary | ICD-10-CM | POA: Diagnosis not present

## 2016-03-19 DIAGNOSIS — C787 Secondary malignant neoplasm of liver and intrahepatic bile duct: Secondary | ICD-10-CM

## 2016-03-19 DIAGNOSIS — D499 Neoplasm of unspecified behavior of unspecified site: Secondary | ICD-10-CM

## 2016-03-19 DIAGNOSIS — I214 Non-ST elevation (NSTEMI) myocardial infarction: Secondary | ICD-10-CM | POA: Diagnosis not present

## 2016-03-19 DIAGNOSIS — E340 Carcinoid syndrome: Secondary | ICD-10-CM

## 2016-03-19 LAB — CMP (CANCER CENTER ONLY)
ALK PHOS: 65 U/L (ref 26–84)
ALT: 23 U/L (ref 10–47)
AST: 30 U/L (ref 11–38)
Albumin: 3.2 g/dL — ABNORMAL LOW (ref 3.3–5.5)
BILIRUBIN TOTAL: 1 mg/dL (ref 0.20–1.60)
BUN, Bld: 18 mg/dL (ref 7–22)
CALCIUM: 9.4 mg/dL (ref 8.0–10.3)
CO2: 29 mEq/L (ref 18–33)
Chloride: 100 mEq/L (ref 98–108)
Creat: 1.1 mg/dl (ref 0.6–1.2)
GLUCOSE: 87 mg/dL (ref 73–118)
POTASSIUM: 4 meq/L (ref 3.3–4.7)
Sodium: 137 mEq/L (ref 128–145)
TOTAL PROTEIN: 7.6 g/dL (ref 6.4–8.1)

## 2016-03-19 LAB — CBC WITH DIFFERENTIAL (CANCER CENTER ONLY)
BASO#: 0 10*3/uL (ref 0.0–0.2)
BASO%: 0.3 % (ref 0.0–2.0)
EOS ABS: 0.3 10*3/uL (ref 0.0–0.5)
EOS%: 4.7 % (ref 0.0–7.0)
HEMATOCRIT: 38 % — AB (ref 38.7–49.9)
HEMOGLOBIN: 12.5 g/dL — AB (ref 13.0–17.1)
LYMPH#: 0.9 10*3/uL (ref 0.9–3.3)
LYMPH%: 13.4 % — ABNORMAL LOW (ref 14.0–48.0)
MCH: 28.4 pg (ref 28.0–33.4)
MCHC: 32.9 g/dL (ref 32.0–35.9)
MCV: 86 fL (ref 82–98)
MONO#: 0.6 10*3/uL (ref 0.1–0.9)
MONO%: 7.8 % (ref 0.0–13.0)
NEUT%: 73.8 % (ref 40.0–80.0)
NEUTROS ABS: 5.2 10*3/uL (ref 1.5–6.5)
Platelets: 162 10*3/uL (ref 145–400)
RBC: 4.4 10*6/uL (ref 4.20–5.70)
RDW: 14.9 % (ref 11.1–15.7)
WBC: 7 10*3/uL (ref 4.0–10.0)

## 2016-03-19 LAB — LACTATE DEHYDROGENASE: LDH: 237 U/L (ref 125–245)

## 2016-03-19 MED ORDER — LANREOTIDE ACETATE 120 MG/0.5ML ~~LOC~~ SOLN
120.0000 mg | Freq: Once | SUBCUTANEOUS | Status: AC
  Administered 2016-03-19: 120 mg via SUBCUTANEOUS
  Filled 2016-03-19: qty 120

## 2016-03-19 NOTE — Progress Notes (Signed)
Cardiac Individual Treatment Plan  Patient Details  Name: Brian Mays MRN: [id number] Date of Birth: 26-Apr-1946 Referring Provider:        CARDIAC REHAB PHASE II ORIENTATION from 03/11/2016 in [facility]   Referring Provider  Kela Millin MD      Initial Encounter Date:       CARDIAC REHAB PHASE II ORIENTATION from 03/11/2016 in [facility]   Date  03/11/16   Referring Provider  Kela Millin MD      Visit Diagnosis: No diagnosis found.  Patient's Home Medications on Admission:  Current outpatient prescriptions:  .  amLODipine (NORVASC) 5 MG tablet, TAKE 1 TABLET DAILY, Disp: 30 tablet, Rfl: 11 .  aspirin EC 81 MG tablet, Take 81 mg by mouth every morning., Disp: , Rfl:  .  atorvastatin (LIPITOR) 80 MG tablet, Take 1 tablet (80 mg total) by mouth daily at 6 PM., Disp: 30 tablet, Rfl: 1 .  Cholecalciferol 1000 UNITS tablet, Take 1,000 Units by mouth daily.  , Disp: , Rfl:  .  CREON 24000 units CPEP, Take 24,000 Units by mouth 3 (three) times daily with meals. , Disp: , Rfl:  .  diphenhydrAMINE (BENADRYL) 50 MG tablet, Take 1 tablet (50 mg total) by mouth once. Take one hour before study, Disp: 2 tablet, Rfl: 0 .  fluticasone (FLONASE) 50 MCG/ACT nasal spray, Place 2 sprays into both nostrils daily as needed (sinuses). , Disp: , Rfl: 11 .  hydrocortisone (ANUSOL-HC) 25 MG suppository, INSERT 1 SUPPOSITORY RECTALLY TWICE A DAY AS NEEDED FOR HEMORRHOIDS, Disp: 30 suppository, Rfl: 0 .  Lanreotide Acetate (SOMATULINE DEPOT [place]), Inject 120 mcg into the skin every 28 (twenty-eight) days. Receives at Dr Antonieta Pert office, Disp: , Rfl:  .  lansoprazole (PREVACID) 30 MG capsule, Take 1 capsule (30 mg total) by mouth daily at 12 noon., Disp: 90 capsule, Rfl: 2 .  levothyroxine (SYNTHROID, LEVOTHROID) 50 MCG tablet, TAKE 1 TABLET DAILY., Disp: 90 tablet, Rfl: 2 .  losartan (COZAAR) 100 MG tablet, Take 1 tablet (100 mg total)  by mouth every morning., Disp: 90 tablet, Rfl: 3 .  metoCLOPramide (REGLAN) 10 MG tablet, Take 10 mg by mouth daily., Disp: , Rfl:  .  metoprolol tartrate (LOPRESSOR) 25 MG tablet, Take 1 tablet (25 mg total) by mouth 2 (two) times daily., Disp: 60 tablet, Rfl: 1 .  nitroGLYCERIN (NITROSTAT) 0.4 MG SL tablet, Place 1 tablet (0.4 mg total) under the tongue every 5 (five) minutes x 3 doses as needed for chest pain., Disp: 25 tablet, Rfl: 4 .  Omega-3 Fatty Acids (FISH OIL) 1000 MG CAPS, Take 1 capsule by mouth daily. Reported on 12/30/2015, Disp: , Rfl:  .  PARoxetine (PAXIL) 10 MG tablet, Take 1 tablet (10 mg total) by mouth daily., Disp: 90 tablet, Rfl: 3 .  predniSONE (DELTASONE) 50 MG tablet, Take 50mg  at 13 hours, 7 hours, and 1 hour before scan, Disp: 3 tablet, Rfl: 0 .  sodium chloride (OCEAN) 0.65 % SOLN nasal spray, Place 1 spray into both nostrils as needed for congestion. , Disp: , Rfl:  .  ticagrelor (BRILINTA) 90 MG TABS tablet, Take 1 tablet (90 mg total) by mouth 2 (two) times daily., Disp: 60 tablet, Rfl: 0 .  vitamin B-12 (CYANOCOBALAMIN) 1000 MCG tablet, Take 1,000 mcg by mouth daily., Disp: , Rfl:   Past Medical History: Past Medical History  Diagnosis Date  . GERD (gastroesophageal reflux disease)   .  Pancreatitis 1998    chronic  . HTN (hypertension)   . Hemorrhoid   . Hypothyroidism   . Colon polyps   . Diverticulosis   . Gallstones   . Hiatal hernia   . Heart murmur   . Anemia     in past  . Metastatic carcinoma ([place]) 2010    Dr Jonette Eva    Tobacco Use: History  Smoking status  . Former Smoker -- 1.50 packs/day for 35 years  . Types: Cigarettes  . Start date: 09/25/1959  . Quit date: 12/21/1997  Smokeless tobacco  . Never Used    Labs:     Recent Review Flowsheet Data    Labs for ITP Cardiac and Pulmonary Rehab Latest Ref Rng 06/16/2009 04/19/2011 04/03/2012 03/07/2015 02/09/2016   Cholestrol 0 - 200 mg/dL 139 - 123 164 147   LDLCALC 0 - 99 mg/dL 82 - 76  100(H) 100(H)   HDL >40 mg/dL 29.70(L) - 26.80(L) 44.60 35(L)   Trlycerides <150 mg/dL 137.0 - 101.0 95.0 59   Hemoglobin A1c 4.6 - 6.5 % - 5.9 5.7 5.8 -      Capillary Blood Glucose: Lab Results  Component Value Date   GLUCAP 100* 05/20/2011   GLUCAP 110* 12/01/2010     Exercise Target Goals:    Exercise Program Goal: Individual exercise prescription set with THRR, safety & activity barriers. Participant demonstrates ability to understand and report RPE using BORG scale, to self-measure pulse accurately, and to acknowledge the importance of the exercise prescription.  Exercise Prescription Goal: Starting with aerobic activity 30 plus minutes a day, 3 days per week for initial exercise prescription. Provide home exercise prescription and guidelines that participant acknowledges understanding prior to discharge.  Activity Barriers & Risk Stratification:     Activity Barriers & Cardiac Risk Stratification - 03/11/16 0854    Activity Barriers & Cardiac Risk Stratification   Activity Barriers Back Problems;Other (comment)   Comments Ocassional dizziness   Cardiac Risk Stratification High      6 Minute Walk:     6 Minute Walk      03/11/16 0914       6 Minute Walk   Phase Initial     Distance 1500 feet     Walk Time 6 minutes     # of Rest Breaks 0     MPH 2.84     METS 2.98     RPE 11     VO2 Peak 10.44     Symptoms No     Resting HR 59 bpm     Resting BP 122/70 mmHg     Max Ex. HR 75 bpm     Max Ex. BP 124/72 mmHg     2 Minute Post BP 124/62 mmHg        Initial Exercise Prescription:     Initial Exercise Prescription - 03/11/16 0900    Date of Initial Exercise RX and Referring Provider   Date 03/11/16   Referring Provider Kela Millin MD   Treadmill   MPH 2.1   Grade 1   Minutes 10   METs 2.9   Bike   Level 0.7   Minutes 10   METs 2.9   NuStep   Level 2   Minutes 10   METs 2   Prescription Details   Frequency (times per week) 3    Duration Progress to 30 minutes of continuous aerobic without signs/symptoms of physical distress   Intensity   THRR 40-80%  of Max Heartrate 60-121   Ratings of Perceived Exertion 11-13   Progression   Progression Continue to progress workloads to maintain intensity without signs/symptoms of physical distress.   Resistance Training   Training Prescription Yes   Weight 2 lbs   Reps 10-12      Perform Capillary Blood Glucose checks as needed.  Exercise Prescription Changes:   Exercise Comments:      Exercise Comments      03/19/16 1046           Exercise Comments Pt is responding to exercises  in CR very wel, there are no changes to Ex Rx at this time, will continue to monitor exercise progression          Discharge Exercise Prescription (Final Exercise Prescription Changes):   Nutrition:  Target Goals: Understanding of nutrition guidelines, daily intake of sodium 1500mg , cholesterol 200mg , calories 30% from fat and 7% or less from saturated fats, daily to have 5 or more servings of fruits and vegetables.  Biometrics:     Pre Biometrics - 03/11/16 0855    Pre Biometrics   Waist Circumference 42.5 inches   Hip Circumference 41 inches   Waist to Hip Ratio 1.04 %   Triceps Skinfold 19 mm   % Body Fat 30 %   Grip Strength 54 kg   Flexibility 0 in   Single Leg Stand 8.84 seconds       Nutrition Therapy Plan and Nutrition Goals:     Nutrition Therapy & Goals - 03/11/16 1115    Nutrition Therapy   Diet Therapeutic Lifestyle Changes   Personal Nutrition Goals   Personal Goal #1 0.5-2 lb wt loss per week to a goal wt loss of 6-20 lb at graduation from [place], educate and counsel regarding individualized specific dietary modifications aiming towards targeted core components such as weight, hypertension, lipid management, diabetes, heart failure and other comorbidities.   Expected Outcomes Short Term Goal:  Understand basic principles of dietary content, such as calories, fat, sodium, cholesterol and nutrients.;Long Term Goal: Adherence to prescribed nutrition plan.      Nutrition Discharge: Nutrition Scores:     Nutrition Assessments - 03/12/16 1024    MEDFICTS Scores   Pre Score 52      Nutrition Goals Re-Evaluation:   Psychosocial: Target Goals: Acknowledge presence or absence of depression, maximize coping skills, provide positive support system. Participant is able to verbalize types and ability to use techniques and skills needed for reducing stress and depression.  Initial Review & Psychosocial Screening:     Initial Psych Review & Screening - 03/15/16 [place]? Yes   Barriers   Psychosocial barriers to participate in program There are no identifiable barriers or psychosocial needs.   Screening Interventions   Interventions Encouraged to exercise      Quality of Life Scores:     Quality of Life - 03/19/16 1653    Quality of Life Scores   GLOBAL Pre --  overall good QOL scores, pt reports no major concerns       PHQ-9:     Recent Review Flowsheet Data    Depression screen [facility] 2/9 03/15/2016 03/08/2016 11/14/2013   Decreased Interest 0 0 0   Down, Depressed, Hopeless - 0 0   PHQ - 2 Score 0 0 0      Psychosocial Evaluation and Intervention:   Psychosocial Re-Evaluation:  Vocational Rehabilitation: Provide vocational rehab assistance to qualifying candidates.   Vocational Rehab Evaluation & Intervention:     Vocational Rehab - 03/19/16 1658    Initial Vocational Rehab Evaluation & Intervention   Assessment shows need for Vocational Rehabilitation No      Education: Education Goals: Education classes will be provided on a weekly basis, covering required topics. Participant will state understanding/return demonstration of topics presented.  Learning Barriers/Preferences:     Learning Barriers/Preferences -  03/11/16 0854    Learning Barriers/Preferences   Learning Barriers Sight;Hearing  glasses, bilateral hearing aids   Learning Preferences Skilled Demonstration      Education Topics: Count Your Pulse:  -Group instruction provided by verbal instruction, demonstration, patient participation and written materials to support subject.  Instructors address importance of being able to find your pulse and how to count your pulse when at home without a heart monitor.  Patients get hands on experience counting their pulse with staff help and individually.   Heart Attack, Angina, and Risk Factor Modification:  -Group instruction provided by verbal instruction, video, and written materials to support subject.  Instructors address signs and symptoms of angina and heart attacks.    Also discuss risk factors for heart disease and how to make changes to improve heart health risk factors.   Functional Fitness:  -Group instruction provided by verbal instruction, demonstration, patient participation, and written materials to support subject.  Instructors address safety measures for doing things around the house.  Discuss how to get up and down off the floor, how to pick things up properly, how to safely get out of a chair without assistance, and balance training.      CARDIAC REHAB PHASE II EXERCISE from 03/19/2016 in [place]   Date  03/19/16   Instruction Review Code  2- meets goals/outcomes      Meditation and Mindfulness:  -Group instruction provided by verbal instruction, patient participation, and written materials to support subject.  Instructor addresses importance of mindfulness and meditation practice to help reduce stress and improve awareness.  Instructor also leads participants through a meditation exercise.    Stretching for Flexibility and Mobility:  -Group instruction provided by verbal instruction, patient participation, and written materials to support  subject.  Instructors lead participants through series of stretches that are designed to increase flexibility thus improving mobility.  These stretches are additional exercise for major muscle groups that are typically performed during regular warm up and cool down.   Hands Only CPR Anytime:  -Group instruction provided by verbal instruction, video, patient participation and written materials to support subject.  Instructors co-teach with AHA video for hands only CPR.  Participants get hands on experience with mannequins.   Nutrition I class: Heart Healthy Eating:  -Group instruction provided by PowerPoint slides, verbal discussion, and written materials to support subject matter. The instructor gives an explanation and review of the Therapeutic Lifestyle Changes diet recommendations, which includes a discussion on lipid goals, dietary fat, sodium, fiber, plant stanol/sterol esters, sugar, and the components of a well-balanced, healthy diet.   Nutrition II class: Lifestyle Skills:  -Group instruction provided by PowerPoint slides, verbal discussion, and written materials to support subject matter. The instructor gives an explanation and review of label reading, grocery shopping for heart health, heart healthy recipe modifications, and ways to make healthier choices when eating out.   Diabetes Question & Answer:  -Group instruction provided by PowerPoint slides, verbal discussion, and written materials to support  subject matter. The instructor gives an explanation and review of diabetes co-morbidities, pre- and post-prandial blood glucose goals, pre-exercise blood glucose goals, signs, symptoms, and treatment of hypoglycemia and hyperglycemia, and foot care basics.   Diabetes Blitz:  -Group instruction provided by PowerPoint slides, verbal discussion, and written materials to support subject matter. The instructor gives an explanation and review of the physiology behind type 1 and type 2 diabetes,  diabetes medications and rational behind using different medications, pre- and post-prandial blood glucose recommendations and Hemoglobin A1c goals, diabetes diet, and exercise including blood glucose guidelines for exercising safely.    Portion Distortion:  -Group instruction provided by PowerPoint slides, verbal discussion, written materials, and food models to support subject matter. The instructor gives an explanation of serving size versus portion size, changes in portions sizes over the last 20 years, and what consists of a serving from each food group.   Stress Management:  -Group instruction provided by verbal instruction, video, and written materials to support subject matter.  Instructors review role of stress in heart disease and how to cope with stress positively.     Exercising on Your Own:  -Group instruction provided by verbal instruction, power point, and written materials to support subject.  Instructors discuss benefits of exercise, components of exercise, frequency and intensity of exercise, and end points for exercise.  Also discuss use of nitroglycerin and activating EMS.  Review options of places to exercise outside of rehab.  Review guidelines for sex with heart disease.   Cardiac Drugs I:  -Group instruction provided by verbal instruction and written materials to support subject.  Instructor reviews cardiac drug classes: antiplatelets, anticoagulants, beta blockers, and statins.  Instructor discusses reasons, side effects, and lifestyle considerations for each drug class.   Cardiac Drugs II:  -Group instruction provided by verbal instruction and written materials to support subject.  Instructor reviews cardiac drug classes: angiotensin converting enzyme inhibitors (ACE-I), angiotensin II receptor blockers (ARBs), nitrates, and calcium channel blockers.  Instructor discusses reasons, side effects, and lifestyle considerations for each drug class.   Anatomy and Physiology  of the Circulatory System:  -Group instruction provided by verbal instruction, video, and written materials to support subject.  Reviews functional anatomy of heart, how it relates to various diagnoses, and what role the heart plays in the overall system.          CARDIAC REHAB PHASE II EXERCISE from 03/19/2016 in [place]   Date  03/17/16   Instruction Review Code  2- meets goals/outcomes      Knowledge Questionnaire Score:     Knowledge Questionnaire Score - 03/11/16 0945    Knowledge Questionnaire Score   Pre Score 20/24      Core Components/Risk Factors/Patient Goals at Admission:     Personal Goals and Risk Factors at Admission - 03/11/16 0833    Core Components/Risk Factors/Patient Goals on Admission    Weight Management Yes;Weight Loss   Intervention Weight Management/Obesity: Establish reasonable short term and long term weight goals.;Weight Management: Develop a combined nutrition and exercise program designed to reach desired caloric intake, while maintaining appropriate intake of nutrient and fiber, sodium and fats, and appropriate energy expenditure required for the weight goal.;Weight Management: Provide education and appropriate resources to help participant work on and attain dietary goals.   Admit Weight 201 lb 4.5 oz (91.3 kg)   Goal Weight: Short Term 195 lb (88.451 kg)   Goal Weight: Long Term 190 lb (86.183 kg)  Expected Outcomes Weight Loss: Understanding of general recommendations for a balanced deficit meal plan, which promotes 1-2 lb weight loss per week and includes a negative energy balance of [phone] kcal/d;Understanding recommendations for meals to include 15-35% energy as protein, 25-35% energy from fat, 35-60% energy from carbohydrates, less than 200mg  of dietary cholesterol, 20-35 gm of total fiber daily;Understanding of distribution of calorie intake throughout the day with the consumption of 4-5 meals/snacks;Long Term:  Adherence to nutrition and physical activity/exercise program aimed toward attainment of established weight goal;Short Term: Continue to assess and modify interventions until short term weight is achieved   Sedentary Yes   Intervention Develop an individualized exercise prescription for aerobic and resistive training based on initial evaluation findings, risk stratification, comorbidities and participant's personal goals.;Provide advice, education, support and counseling about physical activity/exercise needs.   Expected Outcomes Achievement of increased cardiorespiratory fitness and enhanced flexibility, muscular endurance and strength shown through measurements of functional capacity and personal statement of participant.   Increase Strength and Stamina Yes   Intervention Provide advice, education, support and counseling about physical activity/exercise needs.;Develop an individualized exercise prescription for aerobic and resistive training based on initial evaluation findings, risk stratification, comorbidities and participant's personal goals.   Expected Outcomes Achievement of increased cardiorespiratory fitness and enhanced flexibility, muscular endurance and strength shown through measurements of functional capacity and personal statement of participant.   Tobacco Cessation Yes  quit 2002   Hypertension Yes   Intervention Provide education on lifestyle modifcations including regular physical activity/exercise, weight management, moderate sodium restriction and increased consumption of fresh fruit, vegetables, and low fat dairy, alcohol moderation, and smoking cessation.;Monitor prescription use compliance.   Expected Outcomes Short Term: Continued assessment and intervention until BP is < 140/73mm HG in hypertensive participants. < 130/61mm HG in hypertensive participants with diabetes, heart failure or chronic kidney disease.;Long Term: Maintenance of blood pressure at goal levels.   Lipids Yes    Intervention Provide education and support for participant on nutrition & aerobic/resistive exercise along with prescribed medications to achieve LDL 70mg , HDL >40mg .   Expected Outcomes Short Term: Participant states understanding of desired cholesterol values and is compliant with medications prescribed. Participant is following exercise prescription and nutrition guidelines.;Long Term: Cholesterol controlled with medications as prescribed, with individualized exercise RX and with personalized nutrition plan. Value goals: LDL < 70mg , HDL > 40 mg.   Personal Goal Other Yes   Personal Goal short: Learn limitations and get back to normal long: increased flexibility, balance and increase energy   Intervention provide education on exercising safely,increased exercise capacity,  and increase knowledge/awareness on CVD,    Expected Outcomes able to exercise safely on own and get back to normal activities and improved balance and flexibilty      Core Components/Risk Factors/Patient Goals Review:    Core Components/Risk Factors/Patient Goals at Discharge (Final Review):    ITP Comments:     ITP Comments      03/11/16 0816 03/11/16 0853         ITP Comments Dr. Fransico Him, Medical Director Dr. Fransico Him, Medical Director         Comments: Pt is making expected progress toward personal goals after completing 3 sessions. Recommend continued exercise and life style modification education including  stress management and relaxation techniques to decrease cardiac risk profile.

## 2016-03-19 NOTE — Progress Notes (Deleted)
Cardiac Individual Treatment Plan  Patient Details  Name: Brian Mays MRN: [id number] Date of Birth: 09-11-1946 Referring Provider:        CARDIAC REHAB PHASE II ORIENTATION from 03/11/2016 in [place]   Referring Provider  Kela Millin MD      Initial Encounter Date:       CARDIAC REHAB PHASE II ORIENTATION from 03/11/2016 in [place]   Date  03/11/16   Referring Provider  Kela Millin MD      Visit Diagnosis: No diagnosis found.  Patient's Home Medications on Admission:  Current outpatient prescriptions:  .  amLODipine (NORVASC) 5 MG tablet, TAKE 1 TABLET DAILY, Disp: 30 tablet, Rfl: 11 .  aspirin EC 81 MG tablet, Take 81 mg by mouth every morning., Disp: , Rfl:  .  atorvastatin (LIPITOR) 80 MG tablet, Take 1 tablet (80 mg total) by mouth daily at 6 PM., Disp: 30 tablet, Rfl: 1 .  Cholecalciferol 1000 UNITS tablet, Take 1,000 Units by mouth daily.  , Disp: , Rfl:  .  CREON 24000 units CPEP, Take 24,000 Units by mouth 3 (three) times daily with meals. , Disp: , Rfl:  .  diphenhydrAMINE (BENADRYL) 50 MG tablet, Take 1 tablet (50 mg total) by mouth once. Take one hour before study, Disp: 2 tablet, Rfl: 0 .  fluticasone (FLONASE) 50 MCG/ACT nasal spray, Place 2 sprays into both nostrils daily as needed (sinuses). , Disp: , Rfl: 11 .  hydrocortisone (ANUSOL-HC) 25 MG suppository, INSERT 1 SUPPOSITORY RECTALLY TWICE A DAY AS NEEDED FOR HEMORRHOIDS, Disp: 30 suppository, Rfl: 0 .  Lanreotide Acetate (SOMATULINE DEPOT [place]), Inject 120 mcg into the skin every 28 (twenty-eight) days. Receives at Dr Antonieta Pert office, Disp: , Rfl:  .  lansoprazole (PREVACID) 30 MG capsule, Take 1 capsule (30 mg total) by mouth daily at 12 noon., Disp: 90 capsule, Rfl: 2 .  levothyroxine (SYNTHROID, LEVOTHROID) 50 MCG tablet, TAKE 1 TABLET DAILY., Disp: 90 tablet, Rfl: 2 .  losartan (COZAAR) 100 MG tablet, Take 1 tablet (100 mg total)  by mouth every morning., Disp: 90 tablet, Rfl: 3 .  metoCLOPramide (REGLAN) 10 MG tablet, Take 10 mg by mouth daily., Disp: , Rfl:  .  metoprolol tartrate (LOPRESSOR) 25 MG tablet, Take 1 tablet (25 mg total) by mouth 2 (two) times daily., Disp: 60 tablet, Rfl: 1 .  nitroGLYCERIN (NITROSTAT) 0.4 MG SL tablet, Place 1 tablet (0.4 mg total) under the tongue every 5 (five) minutes x 3 doses as needed for chest pain., Disp: 25 tablet, Rfl: 4 .  Omega-3 Fatty Acids (FISH OIL) 1000 MG CAPS, Take 1 capsule by mouth daily. Reported on 12/30/2015, Disp: , Rfl:  .  PARoxetine (PAXIL) 10 MG tablet, Take 1 tablet (10 mg total) by mouth daily., Disp: 90 tablet, Rfl: 3 .  predniSONE (DELTASONE) 50 MG tablet, Take 50mg  at 13 hours, 7 hours, and 1 hour before scan, Disp: 3 tablet, Rfl: 0 .  sodium chloride (OCEAN) 0.65 % SOLN nasal spray, Place 1 spray into both nostrils as needed for congestion. , Disp: , Rfl:  .  ticagrelor (BRILINTA) 90 MG TABS tablet, Take 1 tablet (90 mg total) by mouth 2 (two) times daily., Disp: 60 tablet, Rfl: 0 .  vitamin B-12 (CYANOCOBALAMIN) 1000 MCG tablet, Take 1,000 mcg by mouth daily., Disp: , Rfl:   Past Medical History: Past Medical History  Diagnosis Date  . GERD (gastroesophageal reflux disease)   .  Pancreatitis 1998    chronic  . HTN (hypertension)   . Hemorrhoid   . Hypothyroidism   . Colon polyps   . Diverticulosis   . Gallstones   . Hiatal hernia   . Heart murmur   . Anemia     in past  . Metastatic carcinoma ([place]) 2010    Dr Jonette Eva    Tobacco Use: History  Smoking status  . Former Smoker -- 1.50 packs/day for 35 years  . Types: Cigarettes  . Start date: 09/25/1959  . Quit date: 12/21/1997  Smokeless tobacco  . Never Used    Labs: Recent Review Flowsheet Data    Labs for ITP Cardiac and Pulmonary Rehab Latest Ref Rng 06/16/2009 04/19/2011 04/03/2012 03/07/2015 02/09/2016   Cholestrol 0 - 200 mg/dL 139 - 123 164 147   LDLCALC 0 - 99 mg/dL 82 - 76 100(H)  100(H)   HDL >40 mg/dL 29.70(L) - 26.80(L) 44.60 35(L)   Trlycerides <150 mg/dL 137.0 - 101.0 95.0 59   Hemoglobin A1c 4.6 - 6.5 % - 5.9 5.7 5.8 -      Capillary Blood Glucose: Lab Results  Component Value Date   GLUCAP 100* 05/20/2011   GLUCAP 110* 12/01/2010     Exercise Target Goals:    Exercise Program Goal: Individual exercise prescription set with THRR, safety & activity barriers. Participant demonstrates ability to understand and report RPE using BORG scale, to self-measure pulse accurately, and to acknowledge the importance of the exercise prescription.  Exercise Prescription Goal: Starting with aerobic activity 30 plus minutes a day, 3 days per week for initial exercise prescription. Provide home exercise prescription and guidelines that participant acknowledges understanding prior to discharge.  Activity Barriers & Risk Stratification:     Activity Barriers & Cardiac Risk Stratification - 03/11/16 0854    Activity Barriers & Cardiac Risk Stratification   Activity Barriers Back Problems;Other (comment)   Comments Ocassional dizziness   Cardiac Risk Stratification High      6 Minute Walk:     6 Minute Walk      03/11/16 0914       6 Minute Walk   Phase Initial     Distance 1500 feet     Walk Time 6 minutes     # of Rest Breaks 0     MPH 2.84     METS 2.98     RPE 11     VO2 Peak 10.44     Symptoms No     Resting HR 59 bpm     Resting BP 122/70 mmHg     Max Ex. HR 75 bpm     Max Ex. BP 124/72 mmHg     2 Minute Post BP 124/62 mmHg        Initial Exercise Prescription:     Initial Exercise Prescription - 03/11/16 0900    Date of Initial Exercise RX and Referring Provider   Date 03/11/16   Referring Provider Kela Millin MD   Treadmill   MPH 2.1   Grade 1   Minutes 10   METs 2.9   Bike   Level 0.7   Minutes 10   METs 2.9   NuStep   Level 2   Minutes 10   METs 2   Prescription Details   Frequency (times per week) 3   Duration  Progress to 30 minutes of continuous aerobic without signs/symptoms of physical distress   Intensity   THRR 40-80% of Max Heartrate 60-121  Ratings of Perceived Exertion 11-13   Progression   Progression Continue to progress workloads to maintain intensity without signs/symptoms of physical distress.   Resistance Training   Training Prescription Yes   Weight 2 lbs   Reps 10-12      Perform Capillary Blood Glucose checks as needed.  Exercise Prescription Changes:   Exercise Comments:     Exercise Comments      03/19/16 1046           Exercise Comments Pt is responding to exercises  in CR very wel, there are no changes to Ex Rx at this time, will continue to monitor exercise progression          Discharge Exercise Prescription (Final Exercise Prescription Changes):   Nutrition:  Target Goals: Understanding of nutrition guidelines, daily intake of sodium 1500mg , cholesterol 200mg , calories 30% from fat and 7% or less from saturated fats, daily to have 5 or more servings of fruits and vegetables.  Biometrics:     Pre Biometrics - 03/11/16 0855    Pre Biometrics   Waist Circumference 42.5 inches   Hip Circumference 41 inches   Waist to Hip Ratio 1.04 %   Triceps Skinfold 19 mm   % Body Fat 30 %   Grip Strength 54 kg   Flexibility 0 in   Single Leg Stand 8.84 seconds       Nutrition Therapy Plan and Nutrition Goals:     Nutrition Therapy & Goals - 03/11/16 1115    Nutrition Therapy   Diet Therapeutic Lifestyle Changes   Personal Nutrition Goals   Personal Goal #1 0.5-2 lb wt loss per week to a goal wt loss of 6-20 lb at graduation from [place], educate and counsel regarding individualized specific dietary modifications aiming towards targeted core components such as weight, hypertension, lipid management, diabetes, heart failure and other comorbidities.   Expected Outcomes Short Term Goal: Understand basic  principles of dietary content, such as calories, fat, sodium, cholesterol and nutrients.;Long Term Goal: Adherence to prescribed nutrition plan.      Nutrition Discharge: Nutrition Scores:     Nutrition Assessments - 03/12/16 1024    MEDFICTS Scores   Pre Score 52      Nutrition Goals Re-Evaluation:   Psychosocial: Target Goals: Acknowledge presence or absence of depression, maximize coping skills, provide positive support system. Participant is able to verbalize types and ability to use techniques and skills needed for reducing stress and depression.  Initial Review & Psychosocial Screening:     Initial Psych Review & Screening - 03/15/16 [place]? Yes   Barriers   Psychosocial barriers to participate in program There are no identifiable barriers or psychosocial needs.   Screening Interventions   Interventions Encouraged to exercise      Quality of Life Scores:     Quality of Life - 03/19/16 1653    Quality of Life Scores   GLOBAL Pre --  overall good QOL scores, pt reports no major concerns       PHQ-9:     Recent Review Flowsheet Data    Depression screen [facility] 2/9 03/15/2016 03/08/2016 11/14/2013   Decreased Interest 0 0 0   Down, Depressed, Hopeless - 0 0   PHQ - 2 Score 0 0 0      Psychosocial Evaluation and Intervention:   Psychosocial Re-Evaluation:   Vocational Rehabilitation: Provide vocational rehab  assistance to qualifying candidates.   Vocational Rehab Evaluation & Intervention:     Vocational Rehab - 03/19/16 1658    Initial Vocational Rehab Evaluation & Intervention   Assessment shows need for Vocational Rehabilitation No      Education: Education Goals: Education classes will be provided on a weekly basis, covering required topics. Participant will state understanding/return demonstration of topics presented.  Learning Barriers/Preferences:     Learning Barriers/Preferences - 03/11/16 0854     Learning Barriers/Preferences   Learning Barriers Sight;Hearing  glasses, bilateral hearing aids   Learning Preferences Skilled Demonstration      Education Topics: Count Your Pulse:  -Group instruction provided by verbal instruction, demonstration, patient participation and written materials to support subject.  Instructors address importance of being able to find your pulse and how to count your pulse when at home without a heart monitor.  Patients get hands on experience counting their pulse with staff help and individually.   Heart Attack, Angina, and Risk Factor Modification:  -Group instruction provided by verbal instruction, video, and written materials to support subject.  Instructors address signs and symptoms of angina and heart attacks.    Also discuss risk factors for heart disease and how to make changes to improve heart health risk factors.   Functional Fitness:  -Group instruction provided by verbal instruction, demonstration, patient participation, and written materials to support subject.  Instructors address safety measures for doing things around the house.  Discuss how to get up and down off the floor, how to pick things up properly, how to safely get out of a chair without assistance, and balance training.          CARDIAC REHAB PHASE II EXERCISE from 03/19/2016 in [place]   Date  03/19/16   Instruction Review Code  2- meets goals/outcomes      Meditation and Mindfulness:  -Group instruction provided by verbal instruction, patient participation, and written materials to support subject.  Instructor addresses importance of mindfulness and meditation practice to help reduce stress and improve awareness.  Instructor also leads participants through a meditation exercise.    Stretching for Flexibility and Mobility:  -Group instruction provided by verbal instruction, patient participation, and written materials to support subject.   Instructors lead participants through series of stretches that are designed to increase flexibility thus improving mobility.  These stretches are additional exercise for major muscle groups that are typically performed during regular warm up and cool down.   Hands Only CPR Anytime:  -Group instruction provided by verbal instruction, video, patient participation and written materials to support subject.  Instructors co-teach with AHA video for hands only CPR.  Participants get hands on experience with mannequins.   Nutrition I class: Heart Healthy Eating:  -Group instruction provided by PowerPoint slides, verbal discussion, and written materials to support subject matter. The instructor gives an explanation and review of the Therapeutic Lifestyle Changes diet recommendations, which includes a discussion on lipid goals, dietary fat, sodium, fiber, plant stanol/sterol esters, sugar, and the components of a well-balanced, healthy diet.   Nutrition II class: Lifestyle Skills:  -Group instruction provided by PowerPoint slides, verbal discussion, and written materials to support subject matter. The instructor gives an explanation and review of label reading, grocery shopping for heart health, heart healthy recipe modifications, and ways to make healthier choices when eating out.   Diabetes Question & Answer:  -Group instruction provided by PowerPoint slides, verbal discussion, and written materials to support subject  matter. The instructor gives an explanation and review of diabetes co-morbidities, pre- and post-prandial blood glucose goals, pre-exercise blood glucose goals, signs, symptoms, and treatment of hypoglycemia and hyperglycemia, and foot care basics.   Diabetes Blitz:  -Group instruction provided by PowerPoint slides, verbal discussion, and written materials to support subject matter. The instructor gives an explanation and review of the physiology behind type 1 and type 2 diabetes, diabetes  medications and rational behind using different medications, pre- and post-prandial blood glucose recommendations and Hemoglobin A1c goals, diabetes diet, and exercise including blood glucose guidelines for exercising safely.    Portion Distortion:  -Group instruction provided by PowerPoint slides, verbal discussion, written materials, and food models to support subject matter. The instructor gives an explanation of serving size versus portion size, changes in portions sizes over the last 20 years, and what consists of a serving from each food group.   Stress Management:  -Group instruction provided by verbal instruction, video, and written materials to support subject matter.  Instructors review role of stress in heart disease and how to cope with stress positively.     Exercising on Your Own:  -Group instruction provided by verbal instruction, power point, and written materials to support subject.  Instructors discuss benefits of exercise, components of exercise, frequency and intensity of exercise, and end points for exercise.  Also discuss use of nitroglycerin and activating EMS.  Review options of places to exercise outside of rehab.  Review guidelines for sex with heart disease.   Cardiac Drugs I:  -Group instruction provided by verbal instruction and written materials to support subject.  Instructor reviews cardiac drug classes: antiplatelets, anticoagulants, beta blockers, and statins.  Instructor discusses reasons, side effects, and lifestyle considerations for each drug class.   Cardiac Drugs II:  -Group instruction provided by verbal instruction and written materials to support subject.  Instructor reviews cardiac drug classes: angiotensin converting enzyme inhibitors (ACE-I), angiotensin II receptor blockers (ARBs), nitrates, and calcium channel blockers.  Instructor discusses reasons, side effects, and lifestyle considerations for each drug class.   Anatomy and Physiology of the  Circulatory System:  -Group instruction provided by verbal instruction, video, and written materials to support subject.  Reviews functional anatomy of heart, how it relates to various diagnoses, and what role the heart plays in the overall system.      CARDIAC REHAB PHASE II EXERCISE from 03/19/2016 in [place]   Date  03/17/16   Instruction Review Code  2- meets goals/outcomes      Knowledge Questionnaire Score:     Knowledge Questionnaire Score - 03/11/16 0945    Knowledge Questionnaire Score   Pre Score 20/24      Core Components/Risk Factors/Patient Goals at Admission:     Personal Goals and Risk Factors at Admission - 03/11/16 0833    Core Components/Risk Factors/Patient Goals on Admission    Weight Management Yes;Weight Loss   Intervention Weight Management/Obesity: Establish reasonable short term and long term weight goals.;Weight Management: Develop a combined nutrition and exercise program designed to reach desired caloric intake, while maintaining appropriate intake of nutrient and fiber, sodium and fats, and appropriate energy expenditure required for the weight goal.;Weight Management: Provide education and appropriate resources to help participant work on and attain dietary goals.   Admit Weight 201 lb 4.5 oz (91.3 kg)   Goal Weight: Short Term 195 lb (88.451 kg)   Goal Weight: Long Term 190 lb (86.183 kg)   Expected Outcomes Weight Loss:  Understanding of general recommendations for a balanced deficit meal plan, which promotes 1-2 lb weight loss per week and includes a negative energy balance of [phone] kcal/d;Understanding recommendations for meals to include 15-35% energy as protein, 25-35% energy from fat, 35-60% energy from carbohydrates, less than 200mg  of dietary cholesterol, 20-35 gm of total fiber daily;Understanding of distribution of calorie intake throughout the day with the consumption of 4-5 meals/snacks;Long Term: Adherence to  nutrition and physical activity/exercise program aimed toward attainment of established weight goal;Short Term: Continue to assess and modify interventions until short term weight is achieved   Sedentary Yes   Intervention Develop an individualized exercise prescription for aerobic and resistive training based on initial evaluation findings, risk stratification, comorbidities and participant's personal goals.;Provide advice, education, support and counseling about physical activity/exercise needs.   Expected Outcomes Achievement of increased cardiorespiratory fitness and enhanced flexibility, muscular endurance and strength shown through measurements of functional capacity and personal statement of participant.   Increase Strength and Stamina Yes   Intervention Provide advice, education, support and counseling about physical activity/exercise needs.;Develop an individualized exercise prescription for aerobic and resistive training based on initial evaluation findings, risk stratification, comorbidities and participant's personal goals.   Expected Outcomes Achievement of increased cardiorespiratory fitness and enhanced flexibility, muscular endurance and strength shown through measurements of functional capacity and personal statement of participant.   Tobacco Cessation Yes  quit 2002   Hypertension Yes   Intervention Provide education on lifestyle modifcations including regular physical activity/exercise, weight management, moderate sodium restriction and increased consumption of fresh fruit, vegetables, and low fat dairy, alcohol moderation, and smoking cessation.;Monitor prescription use compliance.   Expected Outcomes Short Term: Continued assessment and intervention until BP is < 140/66mm HG in hypertensive participants. < 130/55mm HG in hypertensive participants with diabetes, heart failure or chronic kidney disease.;Long Term: Maintenance of blood pressure at goal levels.   Lipids Yes    Intervention Provide education and support for participant on nutrition & aerobic/resistive exercise along with prescribed medications to achieve LDL 70mg , HDL >40mg .   Expected Outcomes Short Term: Participant states understanding of desired cholesterol values and is compliant with medications prescribed. Participant is following exercise prescription and nutrition guidelines.;Long Term: Cholesterol controlled with medications as prescribed, with individualized exercise RX and with personalized nutrition plan. Value goals: LDL < 70mg , HDL > 40 mg.   Personal Goal Other Yes   Personal Goal short: Learn limitations and get back to normal long: increased flexibility, balance and increase energy   Intervention provide education on exercising safely,increased exercise capacity,  and increase knowledge/awareness on CVD,    Expected Outcomes able to exercise safely on own and get back to normal activities and improved balance and flexibilty      Core Components/Risk Factors/Patient Goals Review:    Core Components/Risk Factors/Patient Goals at Discharge (Final Review):    ITP Comments:     ITP Comments      03/11/16 0816 03/11/16 0853         ITP Comments Dr. Fransico Him, Medical Director Dr. Fransico Him, Medical Director         Comments: Pt is making expected progress toward personal goals after completing 3 sessions. Recommend continued exercise and life style modification education including  stress management and relaxation techniques to decrease cardiac risk profile.

## 2016-03-19 NOTE — Patient Instructions (Signed)
Lanreotide injection What is this medicine? LANREOTIDE (lan REE oh tide) is used to reduce blood levels of growth hormone in patients with a condition called acromegaly. It also works to slow or stop tumor growth in patients with gastroenteropancreatic neuroendocrine tumor (GEP-NET). This medicine may be used for other purposes; ask your health care provider or pharmacist if you have questions. What should I tell my health care provider before I take this medicine? They need to know if you have any of these conditions: -diabetes -gallbladder disease -heart disease -kidney disease -liver disease -an unusual or allergic reaction to lanreotide, other medicines, latex, foods, dyes, or preservatives -pregnant or trying to get pregnant -breast-feeding How should I use this medicine? This medicine is for injection under the skin. It is given by a health care professional in a hospital or clinic setting. Contact your pediatrician or health care professional regarding the use of this medicine in children. Special care may be needed. Overdosage: If you think you have taken too much of this medicine contact a poison control center or emergency room at once. NOTE: This medicine is only for you. Do not share this medicine with others. What if I miss a dose? It is important not to miss your dose. Call your doctor or health care professional if you are unable to keep an appointment. What may interact with this medicine? -bromocriptine -cyclosporine -medicines for diabetes, including insulin -medicines for heart disease or hypertension -quinidine This list may not describe all possible interactions. Give your health care provider a list of all the medicines, herbs, non-prescription drugs, or dietary supplements you use. Also tell them if you smoke, drink alcohol, or use illegal drugs. Some items may interact with your medicine. What should I watch for while using this medicine? Visit your doctor or  health care professional for regular checks on your progress. Your condition will be monitored carefully while you are receiving this medicine. This medicine may cause increases or decreases in blood sugar. Signs of high blood sugar include frequent urination, unusual thirst, flushed or dry skin, difficulty breathing, drowsiness, stomach ache, nausea, vomiting or dry mouth. Signs of low blood sugar include chills, cool, pale skin or cold sweats, drowsiness, extreme hunger, fast heartbeat, headache, nausea, nervousness or anxiety, shakiness, trembling, unsteadiness, tiredness, or weakness. Contact your doctor or health care professional right away if you experience any of these symptoms. What side effects may I notice from receiving this medicine? Side effects that you should report to your doctor or health care professional as soon as possible: -allergic reactions like skin rash, itching or hives, swelling of the face, lips, or tongue -changes in blood sugar -changes in heart rate -severe stomach pain Side effects that usually do not require medical attention (report to your doctor or health care professional if they continue or are bothersome): -diarrhea or constipation -gas or stomach pain -nausea, vomiting -pain, redness, swelling and irritation at site where injected This list may not describe all possible side effects. Call your doctor for medical advice about side effects. You may report side effects to FDA at 1-800-FDA-1088. Where should I keep my medicine? This drug is given in a hospital or clinic and will not be stored at home. NOTE: This sheet is a summary. It may not cover all possible information. If you have questions about this medicine, talk to your doctor, pharmacist, or health care provider.    2016, Elsevier/Gold Standard. (2013-10-17 17:43:04)

## 2016-03-19 NOTE — Progress Notes (Signed)
Hematology and Oncology Follow Up Visit  ALESSANDER TA [id number] 11/19/1945 70 y.o. 03/19/2016   Principle Diagnosis:  Metastatic low grade neuroendocrine tumor-hepatic metastases  Current Therapy:   S/p third yttrium-90 intrahepatic therapy - November 2016 Somatuline 120 mg monthly    Interim History: Mr. Roa is here today for a follow-up. He had an MI last month with 2 stents placed. He states that he is feeling much better but does have SOB at times with exertion. He started cardiac rehab on Monday and is doing well. His BP is good today at 129/71 with a HR of 53.  He has had no fever, chills, n/v, cough, rash, dizziness, chest pain, palpitations, abdominal pain or changes in bowel or bladder habits.  No swelling, tenderness, numbness or tingling in his extremities. No c/o joint aches and "bone" pain.   He is eating a healthier diet and smaller portions. He is staying hydrated and his weight is down 3 lbs at this visit. He would like to lose 20 lbs total.   Medications:    Medication List       This list is accurate as of: 03/19/16 10:43 AM.  Always use your most recent med list.               amLODipine 5 MG tablet  Commonly known as:  NORVASC  TAKE 1 TABLET DAILY     aspirin EC 81 MG tablet  Take 81 mg by mouth every morning.     atorvastatin 80 MG tablet  Commonly known as:  LIPITOR  Take 1 tablet (80 mg total) by mouth daily at 6 PM.     Cholecalciferol 1000 units tablet  Take 1,000 Units by mouth daily.     CREON 24000 units Cpep  Generic drug:  Pancrelipase (Lip-Prot-Amyl)  Take 24,000 Units by mouth 3 (three) times daily with meals.     diphenhydrAMINE 50 MG tablet  Commonly known as:  BENADRYL  Take 1 tablet (50 mg total) by mouth once. Take one hour before study     Fish Oil 1000 MG Caps  Take 1 capsule by mouth daily. Reported on 12/30/2015     fluticasone 50 MCG/ACT nasal spray  Commonly known as:  FLONASE  Place 2 sprays into both nostrils  daily as needed (sinuses).     hydrocortisone 25 MG suppository  Commonly known as:  ANUSOL-HC  INSERT 1 SUPPOSITORY RECTALLY TWICE A DAY AS NEEDED FOR HEMORRHOIDS     lansoprazole 30 MG capsule  Commonly known as:  PREVACID  Take 1 capsule (30 mg total) by mouth daily at 12 noon.     levothyroxine 50 MCG tablet  Commonly known as:  SYNTHROID, LEVOTHROID  TAKE 1 TABLET DAILY.     losartan 100 MG tablet  Commonly known as:  COZAAR  Take 1 tablet (100 mg total) by mouth every morning.     metoCLOPramide 10 MG tablet  Commonly known as:  REGLAN  Take 10 mg by mouth daily.     metoprolol tartrate 25 MG tablet  Commonly known as:  LOPRESSOR  Take 1 tablet (25 mg total) by mouth 2 (two) times daily.     nitroGLYCERIN 0.4 MG SL tablet  Commonly known as:  NITROSTAT  Place 1 tablet (0.4 mg total) under the tongue every 5 (five) minutes x 3 doses as needed for chest pain.     PARoxetine 10 MG tablet  Commonly known as:  PAXIL  Take 1 tablet (10  mg total) by mouth daily.     predniSONE 50 MG tablet  Commonly known as:  DELTASONE  Take 50mg  at 13 hours, 7 hours, and 1 hour before scan     sodium chloride 0.65 % Soln nasal spray  Commonly known as:  OCEAN  Place 1 spray into both nostrils as needed for congestion.     SOMATULINE DEPOT [place]  Inject 120 mcg into the skin every 28 (twenty-eight) days. Receives at Dr Antonieta Pert office     ticagrelor 90 MG Tabs tablet  Commonly known as:  BRILINTA  Take 1 tablet (90 mg total) by mouth 2 (two) times daily.     vitamin B-12 1000 MCG tablet  Commonly known as:  CYANOCOBALAMIN  Take 1,000 mcg by mouth daily.        Allergies:  Allergies  Allergen Reactions  . Iohexol Hives     Code: HIVES, Desc: PER [name] @ [facility], PT IS ALLERGIC TO CONTRAST DYE 10/02/08/RM  05/01/10...needs full premeds per our protocol w/ [facility]., Onset Date: [id number]     Past Medical History, Surgical history, Social history, and Family History were  reviewed and updated.  Review of Systems: All other 10 point review of systems is negative.   Physical Exam:  height is 5\' 10"  (1.778 m) and weight is 198 lb (89.812 kg). His oral temperature is 98.3 F (36.8 C). His blood pressure is 129/71 and his pulse is 53. His respiration is 14.   Wt Readings from Last 3 Encounters:  03/19/16 198 lb (89.812 kg)  03/11/16 201 lb 4.5 oz (91.3 kg)  03/08/16 201 lb 12 oz (91.513 kg)    Ocular: Sclerae unicteric, pupils equal, round and reactive to light Ear-nose-throat: Oropharynx clear, dentition fair Lymphatic: No cervical supraclavicular or axillary adenopathy Lungs no rales or rhonchi, good excursion bilaterally Heart regular rate and rhythm, no murmur appreciated Abd soft, nontender, positive bowel sounds, no spleen or liver tip palpated on exam, no fluid wave MSK no focal spinal tenderness, no joint edema Neuro: non-focal, well-oriented, appropriate affect Breasts: Deferred  Lab Results  Component Value Date   WBC 7.0 03/19/2016   HGB 12.5* 03/19/2016   HCT 38.0* 03/19/2016   MCV 86 03/19/2016   PLT 162 03/19/2016   No results found for: FERRITIN, IRON, TIBC, UIBC, IRONPCTSAT Lab Results  Component Value Date   RBC 4.40 03/19/2016   No results found for: KPAFRELGTCHN, LAMBDASER, KAPLAMBRATIO No results found for: IGGSERUM, IGA, IGMSERUM No results found for: Odetta Pink, SPEI   Chemistry      Component Value Date/Time   NA 139 02/11/2016 0323   NA 141 01/16/2016 1125   NA 146* 11/17/2015 1322   K 4.4 02/11/2016 0323   K 4.1 01/16/2016 1125   K 4.7 11/17/2015 1322   CL 104 02/11/2016 0323   CL 102 11/17/2015 1322   CO2 25 02/11/2016 0323   CO2 30* 01/16/2016 1125   CO2 30 11/17/2015 1322   BUN 12 02/11/2016 0323   BUN 14.2 01/16/2016 1125   BUN 15 11/17/2015 1322   CREATININE 0.87 02/11/2016 0323   CREATININE 0.9 01/16/2016 1125   CREATININE 0.9 11/17/2015 1322        Component Value Date/Time   CALCIUM 8.8* 02/11/2016 0323   CALCIUM 9.3 01/16/2016 1125   CALCIUM 9.4 11/17/2015 1322   ALKPHOS 62 02/09/2016 1203   ALKPHOS 73 01/16/2016 1125   ALKPHOS 53 11/17/2015 1322  AST 26 02/09/2016 1203   AST 23 01/16/2016 1125   AST 27 11/17/2015 1322   ALT 15 02/09/2016 1203   ALT 16 01/16/2016 1125   ALT 16 11/17/2015 1322   BILITOT 0.5 02/09/2016 1203   BILITOT 0.57 01/16/2016 1125   BILITOT 0.70 11/17/2015 1322     Impression and Plan: Mr. Kerin is 70 year old gentleman with a neuroendocrine carcinoma and liver metastases. He had a third intrahepatic therapy with yttrium-90 back in November 2016. His most recent issue was having an MI last month. He had 2 stents placed to the RCA. He is doing well and started cardiac rehab Monday.  I spoke with pharmacy and we will proceed with his Somatuline injection today as planned. He will stay on his monthly injection schedule. His Chromogranin A was 7 in March. Level was redrawn today and is pending.  We will plan to see him back in 2 month for labs and follow-up.  He knows to contact us with any questions or concerns. We can certainly see him sooner if need be.   Eliezer Bottom, NP 5/19/201710:43 AM

## 2016-03-22 ENCOUNTER — Encounter (HOSPITAL_COMMUNITY)
Admission: RE | Admit: 2016-03-22 | Discharge: 2016-03-22 | Disposition: A | Discharge status: Home / Self Care | Payer: Medicare PPO | Source: Ambulatory Visit | Attending: Cardiology | Admitting: Cardiology

## 2016-03-22 DIAGNOSIS — I214 Non-ST elevation (NSTEMI) myocardial infarction: Secondary | ICD-10-CM | POA: Diagnosis not present

## 2016-03-22 DIAGNOSIS — Z955 Presence of coronary angioplasty implant and graft: Secondary | ICD-10-CM

## 2016-03-22 LAB — CHROMOGRANIN A: CHROMOGRAN A: 11 nmol/L — AB (ref 0–5)

## 2016-03-24 ENCOUNTER — Encounter (HOSPITAL_COMMUNITY)
Admission: RE | Admit: 2016-03-24 | Discharge: 2016-03-24 | Disposition: A | Discharge status: Home / Self Care | Payer: Medicare PPO | Source: Ambulatory Visit | Attending: Cardiology | Admitting: Cardiology

## 2016-03-24 DIAGNOSIS — I214 Non-ST elevation (NSTEMI) myocardial infarction: Secondary | ICD-10-CM

## 2016-03-24 DIAGNOSIS — Z955 Presence of coronary angioplasty implant and graft: Secondary | ICD-10-CM

## 2016-03-26 ENCOUNTER — Encounter (HOSPITAL_COMMUNITY)
Admission: RE | Admit: 2016-03-26 | Discharge: 2016-03-26 | Disposition: A | Discharge status: Home / Self Care | Payer: Medicare PPO | Source: Ambulatory Visit | Attending: Cardiology | Admitting: Cardiology

## 2016-03-26 DIAGNOSIS — I214 Non-ST elevation (NSTEMI) myocardial infarction: Secondary | ICD-10-CM | POA: Diagnosis not present

## 2016-03-26 DIAGNOSIS — Z955 Presence of coronary angioplasty implant and graft: Secondary | ICD-10-CM

## 2016-03-31 ENCOUNTER — Encounter (HOSPITAL_COMMUNITY)
Admission: RE | Admit: 2016-03-31 | Discharge: 2016-03-31 | Disposition: A | Discharge status: Home / Self Care | Payer: Medicare PPO | Source: Ambulatory Visit | Attending: Cardiology | Admitting: Cardiology

## 2016-03-31 DIAGNOSIS — I214 Non-ST elevation (NSTEMI) myocardial infarction: Secondary | ICD-10-CM | POA: Diagnosis not present

## 2016-03-31 DIAGNOSIS — Z955 Presence of coronary angioplasty implant and graft: Secondary | ICD-10-CM

## 2016-03-31 NOTE — Progress Notes (Signed)
Brian Mays 70 y.o. male Nutrition Note Spoke with pt. Nutrition Plan and Nutrition Survey goals reviewed with pt. Pt is following Step 1 of the Therapeutic Lifestyle Changes diet. Pt wants to lose wt. Pt has been trying to lose wt by watching his portion sizes. Wt loss tips reviewed. Pt is pre-diabetic according to pt's last A1c. Pre-diabetes discussed. Pt encouraged to discuss pre-diabetes further with his PCP.  Pt expressed understanding of the information reviewed. Pt aware of nutrition education classes offered.  Lab Results  Component Value Date   HGBA1C 5.8 03/07/2015   Wt Readings from Last 3 Encounters:  03/19/16 198 lb (89.812 kg)  03/11/16 201 lb 4.5 oz (91.3 kg)  03/08/16 201 lb 12 oz (91.513 kg)    Nutrition Diagnosis ? Food-and nutrition-related knowledge deficit related to lack of exposure to information as related to diagnosis of: ? CVD ? Pre-DM ? Overweight related to excessive energy intake as evidenced by a BMI of 28.9  Nutrition RX/ Estimated Daily Nutrition Needs for: wt loss 1550-2050 Kcal, 40-55 gm fat, 10-13 gm sat fat, 1.5-2.0 gm trans-fat, <1500 mg sodium  Nutrition Intervention ? Pt's individual nutrition plan reviewed with pt. ? Benefits of adopting Therapeutic Lifestyle Changes discussed when Medficts reviewed. ? Pt to attend the Portion Distortion class ? Pt to attend the   ? Nutrition I class                  ? Nutrition II class ? Pt given handouts for: ? Nutrition I class ? Nutrition II class ? Continue client-centered nutrition education by RD, as part of interdisciplinary care. Goal(s) ? Pt to identify and limit food sources of saturated fat, trans fat, and sodium ? Pt to identify food quantities necessary to achieve weight loss of 6-24 lb (2.7-10.9 kg) at graduation from cardiac rehab.  Monitor and Evaluate progress toward nutrition goal with team. Derek Mound, M.Ed, RD, LDN, CDE 03/31/2016 11:35 AM

## 2016-03-31 NOTE — Progress Notes (Signed)
Reviewed home exercise with pt today.  Pt plans to go to [facility] (shooting b-ball hoops, plans to walk,bike and use arm ergometer) for exercise. Pt plans to exercise 3x/week in addition to coming to [facility]. Reviewed THR, pulse, RPE, sign and symptoms, NTG use, and when to call 911 or MD.  Also discussed weather considerations and indoor options.  Pt voiced understanding.    Keone Kamer [employer]

## 2016-04-02 ENCOUNTER — Encounter (HOSPITAL_COMMUNITY)
Admission: RE | Admit: 2016-04-02 | Discharge: 2016-04-02 | Disposition: A | Discharge status: Home / Self Care | Payer: Medicare PPO | Source: Ambulatory Visit | Attending: Cardiology | Admitting: Cardiology

## 2016-04-02 DIAGNOSIS — Z955 Presence of coronary angioplasty implant and graft: Secondary | ICD-10-CM

## 2016-04-02 DIAGNOSIS — E039 Hypothyroidism, unspecified: Secondary | ICD-10-CM | POA: Diagnosis not present

## 2016-04-02 DIAGNOSIS — I10 Essential (primary) hypertension: Secondary | ICD-10-CM | POA: Diagnosis not present

## 2016-04-02 DIAGNOSIS — Z79899 Other long term (current) drug therapy: Secondary | ICD-10-CM | POA: Diagnosis not present

## 2016-04-02 DIAGNOSIS — K219 Gastro-esophageal reflux disease without esophagitis: Secondary | ICD-10-CM | POA: Insufficient documentation

## 2016-04-02 DIAGNOSIS — I214 Non-ST elevation (NSTEMI) myocardial infarction: Secondary | ICD-10-CM | POA: Diagnosis present

## 2016-04-02 DIAGNOSIS — Z87891 Personal history of nicotine dependence: Secondary | ICD-10-CM | POA: Insufficient documentation

## 2016-04-05 ENCOUNTER — Encounter (HOSPITAL_COMMUNITY)
Admission: RE | Admit: 2016-04-05 | Discharge: 2016-04-05 | Disposition: A | Discharge status: Home / Self Care | Payer: Medicare PPO | Source: Ambulatory Visit | Attending: Cardiology | Admitting: Cardiology

## 2016-04-05 DIAGNOSIS — I214 Non-ST elevation (NSTEMI) myocardial infarction: Secondary | ICD-10-CM

## 2016-04-05 DIAGNOSIS — Z955 Presence of coronary angioplasty implant and graft: Secondary | ICD-10-CM

## 2016-04-07 ENCOUNTER — Encounter (HOSPITAL_COMMUNITY)
Admission: RE | Admit: 2016-04-07 | Discharge: 2016-04-07 | Disposition: A | Discharge status: Home / Self Care | Payer: Medicare PPO | Source: Ambulatory Visit | Attending: Cardiology | Admitting: Cardiology

## 2016-04-07 DIAGNOSIS — Z955 Presence of coronary angioplasty implant and graft: Secondary | ICD-10-CM

## 2016-04-07 DIAGNOSIS — I214 Non-ST elevation (NSTEMI) myocardial infarction: Secondary | ICD-10-CM | POA: Diagnosis not present

## 2016-04-09 ENCOUNTER — Encounter (HOSPITAL_COMMUNITY)
Admission: RE | Admit: 2016-04-09 | Discharge: 2016-04-09 | Disposition: A | Discharge status: Home / Self Care | Payer: Medicare PPO | Source: Ambulatory Visit | Attending: Cardiology | Admitting: Cardiology

## 2016-04-09 DIAGNOSIS — Z955 Presence of coronary angioplasty implant and graft: Secondary | ICD-10-CM

## 2016-04-09 DIAGNOSIS — I214 Non-ST elevation (NSTEMI) myocardial infarction: Secondary | ICD-10-CM

## 2016-04-12 ENCOUNTER — Encounter (HOSPITAL_COMMUNITY)
Admission: RE | Admit: 2016-04-12 | Discharge: 2016-04-12 | Disposition: A | Discharge status: Home / Self Care | Payer: Medicare PPO | Source: Ambulatory Visit | Attending: Cardiology | Admitting: Cardiology

## 2016-04-12 DIAGNOSIS — I214 Non-ST elevation (NSTEMI) myocardial infarction: Secondary | ICD-10-CM

## 2016-04-12 DIAGNOSIS — Z955 Presence of coronary angioplasty implant and graft: Secondary | ICD-10-CM

## 2016-04-14 ENCOUNTER — Encounter (HOSPITAL_COMMUNITY)
Admission: RE | Admit: 2016-04-14 | Discharge: 2016-04-14 | Disposition: A | Discharge status: Home / Self Care | Payer: Medicare PPO | Source: Ambulatory Visit | Attending: Cardiology | Admitting: Cardiology

## 2016-04-14 DIAGNOSIS — I214 Non-ST elevation (NSTEMI) myocardial infarction: Secondary | ICD-10-CM | POA: Diagnosis not present

## 2016-04-14 DIAGNOSIS — Z955 Presence of coronary angioplasty implant and graft: Secondary | ICD-10-CM

## 2016-04-16 ENCOUNTER — Encounter (HOSPITAL_COMMUNITY)
Admission: RE | Admit: 2016-04-16 | Discharge: 2016-04-16 | Disposition: A | Discharge status: Home / Self Care | Payer: Medicare PPO | Source: Ambulatory Visit | Attending: Cardiology | Admitting: Cardiology

## 2016-04-16 ENCOUNTER — Ambulatory Visit (HOSPITAL_BASED_OUTPATIENT_CLINIC_OR_DEPARTMENT_OTHER): Payer: Medicare PPO

## 2016-04-16 VITALS — BP 105/57 | HR 56 | Temp 98.1°F | Resp 18

## 2016-04-16 DIAGNOSIS — I214 Non-ST elevation (NSTEMI) myocardial infarction: Secondary | ICD-10-CM

## 2016-04-16 DIAGNOSIS — E340 Carcinoid syndrome: Secondary | ICD-10-CM | POA: Diagnosis not present

## 2016-04-16 DIAGNOSIS — D499 Neoplasm of unspecified behavior of unspecified site: Secondary | ICD-10-CM

## 2016-04-16 DIAGNOSIS — Z955 Presence of coronary angioplasty implant and graft: Secondary | ICD-10-CM

## 2016-04-16 DIAGNOSIS — C7B8 Other secondary neuroendocrine tumors: Secondary | ICD-10-CM | POA: Diagnosis not present

## 2016-04-16 DIAGNOSIS — C7A8 Other malignant neuroendocrine tumors: Secondary | ICD-10-CM

## 2016-04-16 MED ORDER — LANREOTIDE ACETATE 120 MG/0.5ML ~~LOC~~ SOLN
120.0000 mg | Freq: Once | SUBCUTANEOUS | Status: AC
  Administered 2016-04-16: 120 mg via SUBCUTANEOUS
  Filled 2016-04-16: qty 120

## 2016-04-16 NOTE — Patient Instructions (Signed)
Lanreotide injection What is this medicine? LANREOTIDE (lan REE oh tide) is used to reduce blood levels of growth hormone in patients with a condition called acromegaly. It also works to slow or stop tumor growth in patients with gastroenteropancreatic neuroendocrine tumor (GEP-NET). This medicine may be used for other purposes; ask your health care provider or pharmacist if you have questions. What should I tell my health care provider before I take this medicine? They need to know if you have any of these conditions: -diabetes -gallbladder disease -heart disease -kidney disease -liver disease -an unusual or allergic reaction to lanreotide, other medicines, latex, foods, dyes, or preservatives -pregnant or trying to get pregnant -breast-feeding How should I use this medicine? This medicine is for injection under the skin. It is given by a health care professional in a hospital or clinic setting. Contact your pediatrician or health care professional regarding the use of this medicine in children. Special care may be needed. Overdosage: If you think you have taken too much of this medicine contact a poison control center or emergency room at once. NOTE: This medicine is only for you. Do not share this medicine with others. What if I miss a dose? It is important not to miss your dose. Call your doctor or health care professional if you are unable to keep an appointment. What may interact with this medicine? -bromocriptine -cyclosporine -medicines for diabetes, including insulin -medicines for heart disease or hypertension -quinidine This list may not describe all possible interactions. Give your health care provider a list of all the medicines, herbs, non-prescription drugs, or dietary supplements you use. Also tell them if you smoke, drink alcohol, or use illegal drugs. Some items may interact with your medicine. What should I watch for while using this medicine? Visit your doctor or  health care professional for regular checks on your progress. Your condition will be monitored carefully while you are receiving this medicine. This medicine may cause increases or decreases in blood sugar. Signs of high blood sugar include frequent urination, unusual thirst, flushed or dry skin, difficulty breathing, drowsiness, stomach ache, nausea, vomiting or dry mouth. Signs of low blood sugar include chills, cool, pale skin or cold sweats, drowsiness, extreme hunger, fast heartbeat, headache, nausea, nervousness or anxiety, shakiness, trembling, unsteadiness, tiredness, or weakness. Contact your doctor or health care professional right away if you experience any of these symptoms. What side effects may I notice from receiving this medicine? Side effects that you should report to your doctor or health care professional as soon as possible: -allergic reactions like skin rash, itching or hives, swelling of the face, lips, or tongue -changes in blood sugar -changes in heart rate -severe stomach pain Side effects that usually do not require medical attention (report to your doctor or health care professional if they continue or are bothersome): -diarrhea or constipation -gas or stomach pain -nausea, vomiting -pain, redness, swelling and irritation at site where injected This list may not describe all possible side effects. Call your doctor for medical advice about side effects. You may report side effects to FDA at 1-800-FDA-1088. Where should I keep my medicine? This drug is given in a hospital or clinic and will not be stored at home. NOTE: This sheet is a summary. It may not cover all possible information. If you have questions about this medicine, talk to your doctor, pharmacist, or health care provider.    2016, Elsevier/Gold Standard. (2013-10-17 17:43:04)

## 2016-04-19 ENCOUNTER — Encounter (HOSPITAL_COMMUNITY)
Admission: RE | Admit: 2016-04-19 | Discharge: 2016-04-19 | Disposition: A | Discharge status: Home / Self Care | Payer: Medicare PPO | Source: Ambulatory Visit | Attending: Cardiology | Admitting: Cardiology

## 2016-04-19 DIAGNOSIS — Z955 Presence of coronary angioplasty implant and graft: Secondary | ICD-10-CM

## 2016-04-19 DIAGNOSIS — I214 Non-ST elevation (NSTEMI) myocardial infarction: Secondary | ICD-10-CM | POA: Diagnosis not present

## 2016-04-21 ENCOUNTER — Encounter (HOSPITAL_COMMUNITY)
Admission: RE | Admit: 2016-04-21 | Discharge: 2016-04-21 | Disposition: A | Discharge status: Home / Self Care | Payer: Medicare PPO | Source: Ambulatory Visit | Attending: Cardiology | Admitting: Cardiology

## 2016-04-21 ENCOUNTER — Other Ambulatory Visit: Payer: Self-pay | Admitting: Internal Medicine

## 2016-04-21 DIAGNOSIS — I214 Non-ST elevation (NSTEMI) myocardial infarction: Secondary | ICD-10-CM

## 2016-04-21 DIAGNOSIS — Z955 Presence of coronary angioplasty implant and graft: Secondary | ICD-10-CM

## 2016-04-21 NOTE — Progress Notes (Signed)
Cardiac Individual Treatment Plan  Patient Details  Name: Brian Mays MRN: [id number] Date of Birth: 04/27/1946 Referring Provider:        CARDIAC REHAB PHASE II ORIENTATION from 03/11/2016 in [place]   Referring Provider  Kela Millin MD      Initial Encounter Date:       CARDIAC REHAB PHASE II ORIENTATION from 03/11/2016 in [place]   Date  03/11/16   Referring Provider  Kela Millin MD      Visit Diagnosis: NSTEMI (non-ST elevated myocardial infarction) [facility])  Stented coronary artery  Patient's Home Medications on Admission:  Current outpatient prescriptions:  .  amLODipine (NORVASC) 5 MG tablet, TAKE 1 TABLET DAILY, Disp: 30 tablet, Rfl: 11 .  aspirin EC 81 MG tablet, Take 81 mg by mouth every morning., Disp: , Rfl:  .  atorvastatin (LIPITOR) 80 MG tablet, Take 1 tablet (80 mg total) by mouth daily at 6 PM., Disp: 30 tablet, Rfl: 1 .  Cholecalciferol 1000 UNITS tablet, Take 1,000 Units by mouth daily.  , Disp: , Rfl:  .  CREON 24000 units CPEP, Take 24,000 Units by mouth 3 (three) times daily with meals. , Disp: , Rfl:  .  diphenhydrAMINE (BENADRYL) 50 MG tablet, Take 1 tablet (50 mg total) by mouth once. Take one hour before study, Disp: 2 tablet, Rfl: 0 .  fluticasone (FLONASE) 50 MCG/ACT nasal spray, Place 2 sprays into both nostrils daily as needed (sinuses). , Disp: , Rfl: 11 .  hydrocortisone (ANUSOL-HC) 25 MG suppository, INSERT 1 SUPPOSITORY RECTALLY TWICE A DAY AS NEEDED FOR HEMORRHOIDS, Disp: 30 suppository, Rfl: 0 .  Lanreotide Acetate (SOMATULINE DEPOT [place]), Inject 120 mcg into the skin every 28 (twenty-eight) days. Receives at Dr Antonieta Pert office, Disp: , Rfl:  .  lansoprazole (PREVACID) 30 MG capsule, Take 1 capsule (30 mg total) by mouth daily at 12 noon., Disp: 90 capsule, Rfl: 2 .  levothyroxine (SYNTHROID, LEVOTHROID) 50 MCG tablet, TAKE 1 TABLET DAILY., Disp: 90 tablet, Rfl: 0 .   losartan (COZAAR) 100 MG tablet, Take 1 tablet (100 mg total) by mouth every morning., Disp: 90 tablet, Rfl: 3 .  metoCLOPramide (REGLAN) 10 MG tablet, Take 10 mg by mouth daily., Disp: , Rfl:  .  metoprolol tartrate (LOPRESSOR) 25 MG tablet, Take 1 tablet (25 mg total) by mouth 2 (two) times daily., Disp: 60 tablet, Rfl: 1 .  nitroGLYCERIN (NITROSTAT) 0.4 MG SL tablet, Place 1 tablet (0.4 mg total) under the tongue every 5 (five) minutes x 3 doses as needed for chest pain., Disp: 25 tablet, Rfl: 4 .  Omega-3 Fatty Acids (FISH OIL) 1000 MG CAPS, Take 1 capsule by mouth daily. Reported on 12/30/2015, Disp: , Rfl:  .  PARoxetine (PAXIL) 10 MG tablet, Take 1 tablet (10 mg total) by mouth daily., Disp: 90 tablet, Rfl: 3 .  predniSONE (DELTASONE) 50 MG tablet, Take 50mg  at 13 hours, 7 hours, and 1 hour before scan, Disp: 3 tablet, Rfl: 0 .  sodium chloride (OCEAN) 0.65 % SOLN nasal spray, Place 1 spray into both nostrils as needed for congestion. , Disp: , Rfl:  .  ticagrelor (BRILINTA) 90 MG TABS tablet, Take 1 tablet (90 mg total) by mouth 2 (two) times daily., Disp: 60 tablet, Rfl: 0 .  vitamin B-12 (CYANOCOBALAMIN) 1000 MCG tablet, Take 1,000 mcg by mouth daily., Disp: , Rfl:   Past Medical History: Past Medical History  Diagnosis Date  .  GERD (gastroesophageal reflux disease)   . Pancreatitis 1998    chronic  . HTN (hypertension)   . Hemorrhoid   . Hypothyroidism   . Colon polyps   . Diverticulosis   . Gallstones   . Hiatal hernia   . Heart murmur   . Anemia     in past  . Metastatic carcinoma ([place]) 2010    Dr Jonette Eva    Tobacco Use: History  Smoking status  . Former Smoker -- 1.50 packs/day for 35 years  . Types: Cigarettes  . Start date: 09/25/1959  . Quit date: 12/21/1997  Smokeless tobacco  . Never Used    Labs: Recent Review Flowsheet Data    Labs for ITP Cardiac and Pulmonary Rehab Latest Ref Rng 06/16/2009 04/19/2011 04/03/2012 03/07/2015 02/09/2016   Cholestrol 0 - 200  mg/dL 139 - 123 164 147   LDLCALC 0 - 99 mg/dL 82 - 76 100(H) 100(H)   HDL >40 mg/dL 29.70(L) - 26.80(L) 44.60 35(L)   Trlycerides <150 mg/dL 137.0 - 101.0 95.0 59   Hemoglobin A1c 4.6 - 6.5 % - 5.9 5.7 5.8 -      Capillary Blood Glucose: Lab Results  Component Value Date   GLUCAP 100* 05/20/2011   GLUCAP 110* 12/01/2010     Exercise Target Goals:    Exercise Program Goal: Individual exercise prescription set with THRR, safety & activity barriers. Participant demonstrates ability to understand and report RPE using BORG scale, to self-measure pulse accurately, and to acknowledge the importance of the exercise prescription.  Exercise Prescription Goal: Starting with aerobic activity 30 plus minutes a day, 3 days per week for initial exercise prescription. Provide home exercise prescription and guidelines that participant acknowledges understanding prior to discharge.  Activity Barriers & Risk Stratification:     Activity Barriers & Cardiac Risk Stratification - 03/11/16 0854    Activity Barriers & Cardiac Risk Stratification   Activity Barriers Back Problems;Other (comment)   Comments Ocassional dizziness   Cardiac Risk Stratification High      6 Minute Walk:     6 Minute Walk      03/11/16 0914       6 Minute Walk   Phase Initial     Distance 1500 feet     Walk Time 6 minutes     # of Rest Breaks 0     MPH 2.84     METS 2.98     RPE 11     VO2 Peak 10.44     Symptoms No     Resting HR 59 bpm     Resting BP 122/70 mmHg     Max Ex. HR 75 bpm     Max Ex. BP 124/72 mmHg     2 Minute Post BP 124/62 mmHg        Initial Exercise Prescription:     Initial Exercise Prescription - 03/11/16 0900    Date of Initial Exercise RX and Referring Provider   Date 03/11/16   Referring Provider Kela Millin MD   Treadmill   MPH 2.1   Grade 1   Minutes 10   METs 2.9   Bike   Level 0.7   Minutes 10   METs 2.9   NuStep   Level 2   Minutes 10   METs 2    Prescription Details   Frequency (times per week) 3   Duration Progress to 30 minutes of continuous aerobic without signs/symptoms of physical distress   Intensity  THRR 40-80% of Max Heartrate 60-121   Ratings of Perceived Exertion 11-13   Progression   Progression Continue to progress workloads to maintain intensity without signs/symptoms of physical distress.   Resistance Training   Training Prescription Yes   Weight 2 lbs   Reps 10-12      Perform Capillary Blood Glucose checks as needed.  Exercise Prescription Changes:     Exercise Prescription Changes      04/05/16 1500 04/14/16 1700         Exercise Review   Progression Yes Yes      Response to Exercise   Blood Pressure (Admit) 122/62 mmHg 130/80 mmHg      Blood Pressure (Exercise) 114/68 mmHg 140/70 mmHg      Blood Pressure (Exit) 116/64 mmHg 124/70 mmHg      Heart Rate (Admit) 64 bpm 61 bpm      Heart Rate (Exercise) 80 bpm 80 bpm      Heart Rate (Exit) 59 bpm 56 bpm      Rating of Perceived Exertion (Exercise) 12 11      Duration Progress to 30 minutes of continuous aerobic without signs/symptoms of physical distress Progress to 30 minutes of continuous aerobic without signs/symptoms of physical distress      Intensity THRR unchanged THRR unchanged      Progression   Progression Continue to progress workloads to maintain intensity without signs/symptoms of physical distress. Continue to progress workloads to maintain intensity without signs/symptoms of physical distress.      Average METs 3.5 4.1      Resistance Training   Training Prescription Yes Yes      Weight 3lbs  3lbs       Reps 10-12 10-12      Treadmill   MPH 2.5 2.9      Grade 3 2      Minutes 10 10      METs 3.95 4.02      Bike   Level 1 1.2      Minutes 10 10      METs 3.1 3.53      NuStep   Level 4 4      Minutes 10 10      METs 3.5 4.1      Home Exercise Plan   Plans to continue exercise at [facility] (comment)  reviewed HEP  on 03/31/16, see progress note ([facility]) [facility] (comment)  reviewed HEP on 03/31/16, see progress note ([facility])      Frequency Add 3 additional days to program exercise sessions. Add 3 additional days to program exercise sessions.         Exercise Comments:     Exercise Comments      03/19/16 1046 04/14/16 1705         Exercise Comments Pt is responding to exercises  in CR very wel, there are no changes to Ex Rx at this time, will continue to monitor exercise progression Reviewed METs and goals, Pt is tolerating exercise very well; will continue to monitor exercise progression         Discharge Exercise Prescription (Final Exercise Prescription Changes):     Exercise Prescription Changes - 04/14/16 1700    Exercise Review   Progression Yes   Response to Exercise   Blood Pressure (Admit) 130/80 mmHg   Blood Pressure (Exercise) 140/70 mmHg   Blood Pressure (Exit) 124/70 mmHg   Heart Rate (Admit) 61 bpm   Heart Rate (Exercise) 80 bpm  Heart Rate (Exit) 56 bpm   Rating of Perceived Exertion (Exercise) 11   Duration Progress to 30 minutes of continuous aerobic without signs/symptoms of physical distress   Intensity THRR unchanged   Progression   Progression Continue to progress workloads to maintain intensity without signs/symptoms of physical distress.   Average METs 4.1   Resistance Training   Training Prescription Yes   Weight 3lbs    Reps 10-12   Treadmill   MPH 2.9   Grade 2   Minutes 10   METs 4.02   Bike   Level 1.2   Minutes 10   METs 3.53   NuStep   Level 4   Minutes 10   METs 4.1   Home Exercise Plan   Plans to continue exercise at [facility] (comment)  reviewed HEP on 03/31/16, see progress note ([facility])   Frequency Add 3 additional days to program exercise sessions.      Nutrition:  Target Goals: Understanding of nutrition guidelines, daily intake of sodium 1500mg , cholesterol 200mg , calories 30% from fat and 7% or less from saturated  fats, daily to have 5 or more servings of fruits and vegetables.  Biometrics:     Pre Biometrics - 03/11/16 0855    Pre Biometrics   Waist Circumference 42.5 inches   Hip Circumference 41 inches   Waist to Hip Ratio 1.04 %   Triceps Skinfold 19 mm   % Body Fat 30 %   Grip Strength 54 kg   Flexibility 0 in   Single Leg Stand 8.84 seconds       Nutrition Therapy Plan and Nutrition Goals:     Nutrition Therapy & Goals - 03/11/16 1115    Nutrition Therapy   Diet Therapeutic Lifestyle Changes   Personal Nutrition Goals   Personal Goal #1 0.5-2 lb wt loss per week to a goal wt loss of 6-20 lb at graduation from [place], educate and counsel regarding individualized specific dietary modifications aiming towards targeted core components such as weight, hypertension, lipid management, diabetes, heart failure and other comorbidities.   Expected Outcomes Short Term Goal: Understand basic principles of dietary content, such as calories, fat, sodium, cholesterol and nutrients.;Long Term Goal: Adherence to prescribed nutrition plan.      Nutrition Discharge: Nutrition Scores:     Nutrition Assessments - 03/12/16 1024    MEDFICTS Scores   Pre Score 52      Nutrition Goals Re-Evaluation:   Psychosocial: Target Goals: Acknowledge presence or absence of depression, maximize coping skills, provide positive support system. Participant is able to verbalize types and ability to use techniques and skills needed for reducing stress and depression.  Initial Review & Psychosocial Screening:     Initial Psych Review & Screening - 03/15/16 [place]? Yes   Barriers   Psychosocial barriers to participate in program There are no identifiable barriers or psychosocial needs.   Screening Interventions   Interventions Encouraged to exercise      Quality of Life Scores:     Quality of Life - 03/19/16  1653    Quality of Life Scores   GLOBAL Pre --  overall good QOL scores, pt reports no major concerns       PHQ-9:     Recent Review Flowsheet Data    Depression screen [facility] 2/9 03/15/2016 03/08/2016 11/14/2013   Decreased Interest 0 0 0   Down,  Depressed, Hopeless - 0 0   PHQ - 2 Score 0 0 0      Psychosocial Evaluation and Intervention:     Psychosocial Evaluation - 04/21/16 1647    Psychosocial Evaluation & Interventions   Interventions Stress management education;Relaxation education;Encouraged to exercise with the program and follow exercise prescription   Continued Psychosocial Services Needed Yes      Psychosocial Re-Evaluation:   Vocational Rehabilitation: Provide vocational rehab assistance to qualifying candidates.   Vocational Rehab Evaluation & Intervention:     Vocational Rehab - 03/19/16 1658    Initial Vocational Rehab Evaluation & Intervention   Assessment shows need for Vocational Rehabilitation No      Education: Education Goals: Education classes will be provided on a weekly basis, covering required topics. Participant will state understanding/return demonstration of topics presented.  Learning Barriers/Preferences:     Learning Barriers/Preferences - 03/11/16 0854    Learning Barriers/Preferences   Learning Barriers Sight;Hearing  glasses, bilateral hearing aids   Learning Preferences Skilled Demonstration      Education Topics: Count Your Pulse:  -Group instruction provided by verbal instruction, demonstration, patient participation and written materials to support subject.  Instructors address importance of being able to find your pulse and how to count your pulse when at home without a heart monitor.  Patients get hands on experience counting their pulse with staff help and individually.          CARDIAC REHAB PHASE II EXERCISE from 04/21/2016 in [place]   Date  04/02/16   Educator  Andi Hence, RN    Instruction Review Code  2- meets goals/outcomes      Heart Attack, Angina, and Risk Factor Modification:  -Group instruction provided by verbal instruction, video, and written materials to support subject.  Instructors address signs and symptoms of angina and heart attacks.    Also discuss risk factors for heart disease and how to make changes to improve heart health risk factors.   Functional Fitness:  -Group instruction provided by verbal instruction, demonstration, patient participation, and written materials to support subject.  Instructors address safety measures for doing things around the house.  Discuss how to get up and down off the floor, how to pick things up properly, how to safely get out of a chair without assistance, and balance training.      CARDIAC REHAB PHASE II EXERCISE from 04/21/2016 in [place]   Date  03/19/16   Instruction Review Code  2- meets goals/outcomes      Meditation and Mindfulness:  -Group instruction provided by verbal instruction, patient participation, and written materials to support subject.  Instructor addresses importance of mindfulness and meditation practice to help reduce stress and improve awareness.  Instructor also leads participants through a meditation exercise.       CARDIAC REHAB PHASE II EXERCISE from 04/21/2016 in [place]   Date  03/24/16   Educator  Jeanella Craze   Instruction Review Code  2- meets goals/outcomes      Stretching for Flexibility and Mobility:  -Group instruction provided by verbal instruction, patient participation, and written materials to support subject.  Instructors lead participants through series of stretches that are designed to increase flexibility thus improving mobility.  These stretches are additional exercise for major muscle groups that are typically performed during regular warm up and cool down.      CARDIAC REHAB PHASE II EXERCISE from  04/21/2016  in [place]   Date  04/21/16   Instruction Review Code  2- meets goals/outcomes      Hands Only CPR Anytime:  -Group instruction provided by verbal instruction, video, patient participation and written materials to support subject.  Instructors co-teach with [facility] video for hands only CPR.  Participants get hands on experience with mannequins.      CARDIAC REHAB PHASE II EXERCISE from 04/21/2016 in [place]   Date  04/16/16   Instruction Review Code  2- meets goals/outcomes      Nutrition I class: Heart Healthy Eating:  -Group instruction provided by PowerPoint slides, verbal discussion, and written materials to support subject matter. The instructor gives an explanation and review of the Therapeutic Lifestyle Changes diet recommendations, which includes a discussion on lipid goals, dietary fat, sodium, fiber, plant stanol/sterol esters, sugar, and the components of a well-balanced, healthy diet.      CARDIAC REHAB PHASE II EXERCISE from 04/21/2016 in [place]   Date  04/06/16   Educator  RD   Instruction Review Code  2- meets goals/outcomes [Class handout given]      Nutrition II class: Lifestyle Skills:  -Group instruction provided by PowerPoint slides, verbal discussion, and written materials to support subject matter. The instructor gives an explanation and review of label reading, grocery shopping for heart health, heart healthy recipe modifications, and ways to make healthier choices when eating out.      CARDIAC REHAB PHASE II EXERCISE from 04/21/2016 in [place]   Date  04/13/16   Educator  RD   Instruction Review Code  2- meets goals/outcomes [Class handouts given]      Diabetes Question & Answer:  -Group instruction provided by PowerPoint slides, verbal discussion, and written materials to support subject matter. The instructor gives  an explanation and review of diabetes co-morbidities, pre- and post-prandial blood glucose goals, pre-exercise blood glucose goals, signs, symptoms, and treatment of hypoglycemia and hyperglycemia, and foot care basics.      CARDIAC REHAB PHASE II EXERCISE from 04/21/2016 in [place]   Date  04/09/16   Educator  RD   Instruction Review Code  2- meets goals/outcomes      Diabetes Blitz:  -Group instruction provided by PowerPoint slides, verbal discussion, and written materials to support subject matter. The instructor gives an explanation and review of the physiology behind type 1 and type 2 diabetes, diabetes medications and rational behind using different medications, pre- and post-prandial blood glucose recommendations and Hemoglobin A1c goals, diabetes diet, and exercise including blood glucose guidelines for exercising safely.    Portion Distortion:  -Group instruction provided by PowerPoint slides, verbal discussion, written materials, and food models to support subject matter. The instructor gives an explanation of serving size versus portion size, changes in portions sizes over the last 20 years, and what consists of a serving from each food group.   Stress Management:  -Group instruction provided by verbal instruction, video, and written materials to support subject matter.  Instructors review role of stress in heart disease and how to cope with stress positively.     Exercising on Your Own:  -Group instruction provided by verbal instruction, power point, and written materials to support subject.  Instructors discuss benefits of exercise, components of exercise, frequency and intensity of exercise, and end points for exercise.  Also discuss use of nitroglycerin and activating  EMS.  Review options of places to exercise outside of rehab.  Review guidelines for sex with heart disease.   Cardiac Drugs I:  -Group instruction provided by verbal instruction  and written materials to support subject.  Instructor reviews cardiac drug classes: antiplatelets, anticoagulants, beta blockers, and statins.  Instructor discusses reasons, side effects, and lifestyle considerations for each drug class.      CARDIAC REHAB PHASE II EXERCISE from 04/21/2016 in [place]   Date  04/14/16   Educator  Clydia Llano      Cardiac Drugs II:  -Group instruction provided by verbal instruction and written materials to support subject.  Instructor reviews cardiac drug classes: angiotensin converting enzyme inhibitors (ACE-I), angiotensin II receptor blockers (ARBs), nitrates, and calcium channel blockers.  Instructor discusses reasons, side effects, and lifestyle considerations for each drug class.   Anatomy and Physiology of the Circulatory System:  -Group instruction provided by verbal instruction, video, and written materials to support subject.  Reviews functional anatomy of heart, how it relates to various diagnoses, and what role the heart plays in the overall system.      CARDIAC REHAB PHASE II EXERCISE from 04/21/2016 in [place]   Date  03/17/16   Instruction Review Code  2- meets goals/outcomes      Knowledge Questionnaire Score:     Knowledge Questionnaire Score - 03/11/16 0945    Knowledge Questionnaire Score   Pre Score 20/24      Core Components/Risk Factors/Patient Goals at Admission:     Personal Goals and Risk Factors at Admission - 03/11/16 0833    Core Components/Risk Factors/Patient Goals on Admission    Weight Management Yes;Weight Loss   Intervention Weight Management/Obesity: Establish reasonable short term and long term weight goals.;Weight Management: Develop a combined nutrition and exercise program designed to reach desired caloric intake, while maintaining appropriate intake of nutrient and fiber, sodium and fats, and appropriate energy expenditure required for the weight  goal.;Weight Management: Provide education and appropriate resources to help participant work on and attain dietary goals.   Admit Weight 201 lb 4.5 oz (91.3 kg)   Goal Weight: Short Term 195 lb (88.451 kg)   Goal Weight: Long Term 190 lb (86.183 kg)   Expected Outcomes Weight Loss: Understanding of general recommendations for a balanced deficit meal plan, which promotes 1-2 lb weight loss per week and includes a negative energy balance of [id number] kcal/d;Understanding recommendations for meals to include 15-35% energy as protein, 25-35% energy from fat, 35-60% energy from carbohydrates, less than 200mg  of dietary cholesterol, 20-35 gm of total fiber daily;Understanding of distribution of calorie intake throughout the day with the consumption of 4-5 meals/snacks;Long Term: Adherence to nutrition and physical activity/exercise program aimed toward attainment of established weight goal;Short Term: Continue to assess and modify interventions until short term weight is achieved   Sedentary Yes   Intervention Develop an individualized exercise prescription for aerobic and resistive training based on initial evaluation findings, risk stratification, comorbidities and participant's personal goals.;Provide advice, education, support and counseling about physical activity/exercise needs.   Expected Outcomes Achievement of increased cardiorespiratory fitness and enhanced flexibility, muscular endurance and strength shown through measurements of functional capacity and personal statement of participant.   Increase Strength and Stamina Yes   Intervention Provide advice, education, support and counseling about physical activity/exercise needs.;Develop an individualized exercise prescription for aerobic and resistive training based on initial evaluation findings, risk stratification, comorbidities and participant's personal goals.  Expected Outcomes Achievement of increased cardiorespiratory fitness and enhanced  flexibility, muscular endurance and strength shown through measurements of functional capacity and personal statement of participant.   Tobacco Cessation Yes  quit 2002   Hypertension Yes   Intervention Provide education on lifestyle modifcations including regular physical activity/exercise, weight management, moderate sodium restriction and increased consumption of fresh fruit, vegetables, and low fat dairy, alcohol moderation, and smoking cessation.;Monitor prescription use compliance.   Expected Outcomes Short Term: Continued assessment and intervention until BP is < 140/74mm HG in hypertensive participants. < 130/59mm HG in hypertensive participants with diabetes, heart failure or chronic kidney disease.;Long Term: Maintenance of blood pressure at goal levels.   Lipids Yes   Intervention Provide education and support for participant on nutrition & aerobic/resistive exercise along with prescribed medications to achieve LDL 70mg , HDL >40mg .   Expected Outcomes Short Term: Participant states understanding of desired cholesterol values and is compliant with medications prescribed. Participant is following exercise prescription and nutrition guidelines.;Long Term: Cholesterol controlled with medications as prescribed, with individualized exercise RX and with personalized nutrition plan. Value goals: LDL < 70mg , HDL > 40 mg.   Personal Goal Other Yes   Personal Goal short: Learn limitations and get back to normal long: increased flexibility, balance and increase energy   Intervention provide education on exercising safely,increased exercise capacity,  and increase knowledge/awareness on CVD,    Expected Outcomes able to exercise safely on own and get back to normal activities and improved balance and flexibilty      Core Components/Risk Factors/Patient Goals Review:      Goals and Risk Factor Review      04/14/16 1706           Core Components/Risk Factors/Patient Goals Review   Personal  Goals Review Increase Strength and Stamina;Weight Management/Obesity       Review Energy and stamina is improving. Plans to go to [facility] to swim for more exercise and has lost 4-5 lbs        Expected Outcomes Pt will continue to lose weight, have increased energy and do home exercise program          Core Components/Risk Factors/Patient Goals at Discharge (Final Review):      Goals and Risk Factor Review - 04/14/16 1706    Core Components/Risk Factors/Patient Goals Review   Personal Goals Review Increase Strength and Stamina;Weight Management/Obesity   Review Energy and stamina is improving. Plans to go to [facility] to swim for more exercise and has lost 4-5 lbs    Expected Outcomes Pt will continue to lose weight, have increased energy and do home exercise program      ITP Comments:     ITP Comments      03/11/16 0816 03/11/16 0853         ITP Comments Dr. Fransico Him, Medical Director Dr. Fransico Him, Medical Director         Comments: Pt is making expected progress toward personal goals after completing 17 sessions. Recommend continued exercise and life style modification education including  stress management and relaxation techniques to decrease cardiac risk profile.

## 2016-04-23 ENCOUNTER — Encounter (HOSPITAL_COMMUNITY)
Admission: RE | Admit: 2016-04-23 | Discharge: 2016-04-23 | Disposition: A | Discharge status: Home / Self Care | Payer: Medicare PPO | Source: Ambulatory Visit | Attending: Cardiology | Admitting: Cardiology

## 2016-04-23 DIAGNOSIS — I214 Non-ST elevation (NSTEMI) myocardial infarction: Secondary | ICD-10-CM | POA: Diagnosis not present

## 2016-04-23 DIAGNOSIS — Z955 Presence of coronary angioplasty implant and graft: Secondary | ICD-10-CM

## 2016-04-26 ENCOUNTER — Encounter (HOSPITAL_COMMUNITY)
Admission: RE | Admit: 2016-04-26 | Discharge: 2016-04-26 | Disposition: A | Discharge status: Home / Self Care | Payer: Medicare PPO | Source: Ambulatory Visit | Attending: Cardiology | Admitting: Cardiology

## 2016-04-26 DIAGNOSIS — I214 Non-ST elevation (NSTEMI) myocardial infarction: Secondary | ICD-10-CM

## 2016-04-26 DIAGNOSIS — Z955 Presence of coronary angioplasty implant and graft: Secondary | ICD-10-CM

## 2016-04-28 ENCOUNTER — Encounter (HOSPITAL_COMMUNITY)
Admission: RE | Admit: 2016-04-28 | Discharge: 2016-04-28 | Disposition: A | Discharge status: Home / Self Care | Payer: Medicare PPO | Source: Ambulatory Visit | Attending: Cardiology | Admitting: Cardiology

## 2016-04-28 DIAGNOSIS — I214 Non-ST elevation (NSTEMI) myocardial infarction: Secondary | ICD-10-CM | POA: Diagnosis not present

## 2016-04-28 DIAGNOSIS — Z955 Presence of coronary angioplasty implant and graft: Secondary | ICD-10-CM

## 2016-04-30 ENCOUNTER — Encounter (HOSPITAL_COMMUNITY)
Admission: RE | Admit: 2016-04-30 | Discharge: 2016-04-30 | Disposition: A | Discharge status: Home / Self Care | Payer: Medicare PPO | Source: Ambulatory Visit | Attending: Cardiology | Admitting: Cardiology

## 2016-04-30 DIAGNOSIS — I214 Non-ST elevation (NSTEMI) myocardial infarction: Secondary | ICD-10-CM | POA: Diagnosis not present

## 2016-04-30 DIAGNOSIS — Z955 Presence of coronary angioplasty implant and graft: Secondary | ICD-10-CM

## 2016-05-03 ENCOUNTER — Encounter (HOSPITAL_COMMUNITY)
Admission: RE | Admit: 2016-05-03 | Discharge: 2016-05-03 | Disposition: A | Discharge status: Home / Self Care | Payer: Medicare PPO | Source: Ambulatory Visit | Attending: Cardiology | Admitting: Cardiology

## 2016-05-03 DIAGNOSIS — Z79899 Other long term (current) drug therapy: Secondary | ICD-10-CM | POA: Diagnosis not present

## 2016-05-03 DIAGNOSIS — E039 Hypothyroidism, unspecified: Secondary | ICD-10-CM | POA: Insufficient documentation

## 2016-05-03 DIAGNOSIS — K219 Gastro-esophageal reflux disease without esophagitis: Secondary | ICD-10-CM | POA: Insufficient documentation

## 2016-05-03 DIAGNOSIS — I10 Essential (primary) hypertension: Secondary | ICD-10-CM | POA: Insufficient documentation

## 2016-05-03 DIAGNOSIS — Z955 Presence of coronary angioplasty implant and graft: Secondary | ICD-10-CM | POA: Diagnosis present

## 2016-05-03 DIAGNOSIS — Z87891 Personal history of nicotine dependence: Secondary | ICD-10-CM | POA: Diagnosis not present

## 2016-05-03 DIAGNOSIS — I214 Non-ST elevation (NSTEMI) myocardial infarction: Secondary | ICD-10-CM | POA: Diagnosis not present

## 2016-05-05 ENCOUNTER — Encounter (HOSPITAL_COMMUNITY)
Admission: RE | Admit: 2016-05-05 | Discharge: 2016-05-05 | Disposition: A | Discharge status: Home / Self Care | Payer: Medicare PPO | Source: Ambulatory Visit | Attending: Cardiology | Admitting: Cardiology

## 2016-05-05 DIAGNOSIS — I214 Non-ST elevation (NSTEMI) myocardial infarction: Secondary | ICD-10-CM

## 2016-05-07 ENCOUNTER — Encounter (HOSPITAL_COMMUNITY)
Admission: RE | Admit: 2016-05-07 | Discharge: 2016-05-07 | Disposition: A | Discharge status: Home / Self Care | Payer: Medicare PPO | Source: Ambulatory Visit | Attending: Cardiology | Admitting: Cardiology

## 2016-05-07 DIAGNOSIS — Z955 Presence of coronary angioplasty implant and graft: Secondary | ICD-10-CM

## 2016-05-07 DIAGNOSIS — I214 Non-ST elevation (NSTEMI) myocardial infarction: Secondary | ICD-10-CM

## 2016-05-10 ENCOUNTER — Encounter (HOSPITAL_COMMUNITY)
Admission: RE | Admit: 2016-05-10 | Discharge: 2016-05-10 | Disposition: A | Discharge status: Home / Self Care | Payer: Medicare PPO | Source: Ambulatory Visit | Attending: Cardiology | Admitting: Cardiology

## 2016-05-10 DIAGNOSIS — I214 Non-ST elevation (NSTEMI) myocardial infarction: Secondary | ICD-10-CM

## 2016-05-10 DIAGNOSIS — Z955 Presence of coronary angioplasty implant and graft: Secondary | ICD-10-CM

## 2016-05-12 ENCOUNTER — Encounter (HOSPITAL_COMMUNITY)
Admission: RE | Admit: 2016-05-12 | Discharge: 2016-05-12 | Disposition: A | Discharge status: Home / Self Care | Payer: Medicare PPO | Source: Ambulatory Visit | Attending: Cardiology | Admitting: Cardiology

## 2016-05-12 DIAGNOSIS — I214 Non-ST elevation (NSTEMI) myocardial infarction: Secondary | ICD-10-CM

## 2016-05-12 DIAGNOSIS — Z955 Presence of coronary angioplasty implant and graft: Secondary | ICD-10-CM

## 2016-05-12 NOTE — Progress Notes (Signed)
Cardiac Individual Treatment Plan  Patient Details  Name: Brian Mays MRN: [id number] Date of Birth: [birth year] Referring Provider:        CARDIAC REHAB PHASE II ORIENTATION from 03/11/2016 in [facility]   Referring Provider  Kela Millin MD      Initial Encounter Date:       CARDIAC REHAB PHASE II ORIENTATION from 03/11/2016 in [place]   Date  03/11/16   Referring Provider  Kela Millin MD      Visit Diagnosis: NSTEMI (non-ST elevated myocardial infarction) [facility])  Stented coronary artery  Patient's Home Medications on Admission:  Current outpatient prescriptions:  .  amLODipine (NORVASC) 5 MG tablet, TAKE 1 TABLET DAILY, Disp: 30 tablet, Rfl: 11 .  aspirin EC 81 MG tablet, Take 81 mg by mouth every morning., Disp: , Rfl:  .  atorvastatin (LIPITOR) 80 MG tablet, Take 1 tablet (80 mg total) by mouth daily at 6 PM., Disp: 30 tablet, Rfl: 1 .  Cholecalciferol 1000 UNITS tablet, Take 1,000 Units by mouth daily.  , Disp: , Rfl:  .  CREON 24000 units CPEP, Take 24,000 Units by mouth 3 (three) times daily with meals. , Disp: , Rfl:  .  diphenhydrAMINE (BENADRYL) 50 MG tablet, Take 1 tablet (50 mg total) by mouth once. Take one hour before study, Disp: 2 tablet, Rfl: 0 .  fluticasone (FLONASE) 50 MCG/ACT nasal spray, Place 2 sprays into both nostrils daily as needed (sinuses). , Disp: , Rfl: 11 .  hydrocortisone (ANUSOL-HC) 25 MG suppository, INSERT 1 SUPPOSITORY RECTALLY TWICE A DAY AS NEEDED FOR HEMORRHOIDS, Disp: 30 suppository, Rfl: 0 .  Lanreotide Acetate (SOMATULINE DEPOT [place]), Inject 120 mcg into the skin every 28 (twenty-eight) days. Receives at Dr Antonieta Pert office, Disp: , Rfl:  .  lansoprazole (PREVACID) 30 MG capsule, Take 1 capsule (30 mg total) by mouth daily at 12 noon., Disp: 90 capsule, Rfl: 2 .  levothyroxine (SYNTHROID, LEVOTHROID) 50 MCG tablet, TAKE 1 TABLET DAILY., Disp: 90 tablet, Rfl: 0 .   losartan (COZAAR) 100 MG tablet, Take 1 tablet (100 mg total) by mouth every morning., Disp: 90 tablet, Rfl: 3 .  metoCLOPramide (REGLAN) 10 MG tablet, Take 10 mg by mouth daily., Disp: , Rfl:  .  metoprolol tartrate (LOPRESSOR) 25 MG tablet, Take 1 tablet (25 mg total) by mouth 2 (two) times daily., Disp: 60 tablet, Rfl: 1 .  nitroGLYCERIN (NITROSTAT) 0.4 MG SL tablet, Place 1 tablet (0.4 mg total) under the tongue every 5 (five) minutes x 3 doses as needed for chest pain., Disp: 25 tablet, Rfl: 4 .  Omega-3 Fatty Acids (FISH OIL) 1000 MG CAPS, Take 1 capsule by mouth daily. Reported on 12/30/2015, Disp: , Rfl:  .  PARoxetine (PAXIL) 10 MG tablet, Take 1 tablet (10 mg total) by mouth daily., Disp: 90 tablet, Rfl: 3 .  predniSONE (DELTASONE) 50 MG tablet, Take 50mg  at 13 hours, 7 hours, and 1 hour before scan, Disp: 3 tablet, Rfl: 0 .  sodium chloride (OCEAN) 0.65 % SOLN nasal spray, Place 1 spray into both nostrils as needed for congestion. , Disp: , Rfl:  .  ticagrelor (BRILINTA) 90 MG TABS tablet, Take 1 tablet (90 mg total) by mouth 2 (two) times daily., Disp: 60 tablet, Rfl: 0 .  vitamin B-12 (CYANOCOBALAMIN) 1000 MCG tablet, Take 1,000 mcg by mouth daily., Disp: , Rfl:   Past Medical History: Past Medical History  Diagnosis Date  .  GERD (gastroesophageal reflux disease)   . Pancreatitis 1998    chronic  . HTN (hypertension)   . Hemorrhoid   . Hypothyroidism   . Colon polyps   . Diverticulosis   . Gallstones   . Hiatal hernia   . Heart murmur   . Anemia     in past  . Metastatic carcinoma ([place]) 2010    Dr Jonette Eva    Tobacco Use: History  Smoking status  . Former Smoker -- 1.50 packs/day for 35 years  . Types: Cigarettes  . Start date: 09/25/1959  . Quit date: 12/21/1997  Smokeless tobacco  . Never Used    Labs:     Recent Review Flowsheet Data    Labs for ITP Cardiac and Pulmonary Rehab Latest Ref Rng 06/16/2009 04/19/2011 04/03/2012 03/07/2015 02/09/2016   Cholestrol 0 -  200 mg/dL 139 - 123 164 147   LDLCALC 0 - 99 mg/dL 82 - 76 100(H) 100(H)   HDL >40 mg/dL 29.70(L) - 26.80(L) 44.60 35(L)   Trlycerides <150 mg/dL 137.0 - 101.0 95.0 59   Hemoglobin A1c 4.6 - 6.5 % - 5.9 5.7 5.8 -      Capillary Blood Glucose: Lab Results  Component Value Date   GLUCAP 100* 05/20/2011   GLUCAP 110* 12/01/2010     Exercise Target Goals:    Exercise Program Goal: Individual exercise prescription set with THRR, safety & activity barriers. Participant demonstrates ability to understand and report RPE using BORG scale, to self-measure pulse accurately, and to acknowledge the importance of the exercise prescription.  Exercise Prescription Goal: Starting with aerobic activity 30 plus minutes a day, 3 days per week for initial exercise prescription. Provide home exercise prescription and guidelines that participant acknowledges understanding prior to discharge.  Activity Barriers & Risk Stratification:     Activity Barriers & Cardiac Risk Stratification - 03/11/16 0854    Activity Barriers & Cardiac Risk Stratification   Activity Barriers Back Problems;Other (comment)   Comments Ocassional dizziness   Cardiac Risk Stratification High      6 Minute Walk:     6 Minute Walk      03/11/16 0914       6 Minute Walk   Phase Initial     Distance 1500 feet     Walk Time 6 minutes     # of Rest Breaks 0     MPH 2.84     METS 2.98     RPE 11     VO2 Peak 10.44     Symptoms No     Resting HR 59 bpm     Resting BP 122/70 mmHg     Max Ex. HR 75 bpm     Max Ex. BP 124/72 mmHg     2 Minute Post BP 124/62 mmHg        Initial Exercise Prescription:     Initial Exercise Prescription - 03/11/16 0900    Date of Initial Exercise RX and Referring Provider   Date 03/11/16   Referring Provider Kela Millin MD   Treadmill   MPH 2.1   Grade 1   Minutes 10   METs 2.9   Bike   Level 0.7   Minutes 10   METs 2.9   NuStep   Level 2   Minutes 10   METs 2    Prescription Details   Frequency (times per week) 3   Duration Progress to 30 minutes of continuous aerobic without signs/symptoms of physical distress  Intensity   THRR 40-80% of Max Heartrate 60-121   Ratings of Perceived Exertion 11-13   Progression   Progression Continue to progress workloads to maintain intensity without signs/symptoms of physical distress.   Resistance Training   Training Prescription Yes   Weight 2 lbs   Reps 10-12      Perform Capillary Blood Glucose checks as needed.  Exercise Prescription Changes:      Exercise Prescription Changes      04/05/16 1500 04/14/16 1700 05/03/16 1100 05/13/16 1600 05/19/16 1600   Exercise Review   Progression Yes Yes Yes Yes Yes   Response to Exercise   Blood Pressure (Admit) 122/62 mmHg 130/80 mmHg 118/60 mmHg 134/70 mmHg 124/66 mmHg   Blood Pressure (Exercise) 114/68 mmHg 140/70 mmHg 140/70 mmHg 122/82 mmHg 122/64 mmHg   Blood Pressure (Exit) 116/64 mmHg 124/70 mmHg 124/70 mmHg 130/70 mmHg 118/60 mmHg   Heart Rate (Admit) 64 bpm 61 bpm 61 bpm 60 bpm 51 bpm   Heart Rate (Exercise) 80 bpm 80 bpm 80 bpm 86 bpm 91 bpm   Heart Rate (Exit) 59 bpm 56 bpm 56 bpm 59 bpm 56 bpm   Rating of Perceived Exertion (Exercise) 12 11 11 12 11    Duration Progress to 30 minutes of continuous aerobic without signs/symptoms of physical distress Progress to 30 minutes of continuous aerobic without signs/symptoms of physical distress Progress to 30 minutes of continuous aerobic without signs/symptoms of physical distress Progress to 30 minutes of continuous aerobic without signs/symptoms of physical distress Progress to 30 minutes of continuous aerobic without signs/symptoms of physical distress   Intensity THRR unchanged THRR unchanged THRR unchanged THRR unchanged THRR unchanged   Progression   Progression Continue to progress workloads to maintain intensity without signs/symptoms of physical distress. Continue to progress workloads to maintain  intensity without signs/symptoms of physical distress. Continue to progress workloads to maintain intensity without signs/symptoms of physical distress. Continue to progress workloads to maintain intensity without signs/symptoms of physical distress. Continue to progress workloads to maintain intensity without signs/symptoms of physical distress.   Average METs 3.5 4.1 4.6 4.6 4.7   Resistance Training   Training Prescription Yes Yes Yes Yes Yes   Weight 3lbs  3lbs  4lbs 4lbs 4lbs   Reps 10-12 10-12 10-12 10-12 10-12   Treadmill   MPH 2.5 2.9 3.2 3.2 3.2   Grade 3 2 4 4 4    Minutes 10 10 10 10 10    METs 3.95 4.02 5.21 5.21 5.21   Bike   Level 1 1.2 1.2 1.2 1.2   Minutes 10 10 10 10 10    METs 3.1 3.53 3.53 3.58 3.58   NuStep   Level 4 4 4 4 5    Minutes 10 10 10 10 10    METs 3.5 4.1 4.5 3.9 4.2   Home Exercise Plan   Plans to continue exercise at [facility] (comment)  reviewed HEP on 03/31/16, see progress note ([facility]) [facility] (comment)  reviewed HEP on 03/31/16, see progress note ([facility]) [facility] (comment)  reviewed HEP on 03/31/16, see progress note ([facility]) [facility] (comment)  reviewed HEP on 03/31/16, see progress note ([facility]) [facility] (comment)  reviewed HEP on 03/31/16, see progress note ([facility])   Frequency Add 3 additional days to program exercise sessions. Add 3 additional days to program exercise sessions. Add 3 additional days to program exercise sessions. Add 3 additional days to program exercise sessions. Add 3 additional days to program exercise sessions.  Exercise Comments:      Exercise Comments      03/19/16 1046 04/14/16 1705 05/13/16 1635       Exercise Comments Pt is responding to exercises  in CR very wel, there are no changes to Ex Rx at this time, will continue to monitor exercise progression Reviewed METs and goals, Pt is tolerating exercise very well; will continue to monitor exercise progression Reviewed METs and  goals, Pt is tolerating exercise very well; will continue to monitor exercise progression        Discharge Exercise Prescription (Final Exercise Prescription Changes):     Exercise Prescription Changes - 05/19/16 1600    Exercise Review   Progression Yes   Response to Exercise   Blood Pressure (Admit) 124/66 mmHg   Blood Pressure (Exercise) 122/64 mmHg   Blood Pressure (Exit) 118/60 mmHg   Heart Rate (Admit) 51 bpm   Heart Rate (Exercise) 91 bpm   Heart Rate (Exit) 56 bpm   Rating of Perceived Exertion (Exercise) 11   Duration Progress to 30 minutes of continuous aerobic without signs/symptoms of physical distress   Intensity THRR unchanged   Progression   Progression Continue to progress workloads to maintain intensity without signs/symptoms of physical distress.   Average METs 4.7   Resistance Training   Training Prescription Yes   Weight 4lbs   Reps 10-12   Treadmill   MPH 3.2   Grade 4   Minutes 10   METs 5.21   Bike   Level 1.2   Minutes 10   METs 3.58   NuStep   Level 5   Minutes 10   METs 4.2   Home Exercise Plan   Plans to continue exercise at [facility] (comment)  reviewed HEP on 03/31/16, see progress note ([facility])   Frequency Add 3 additional days to program exercise sessions.      Nutrition:  Target Goals: Understanding of nutrition guidelines, daily intake of sodium 1500mg , cholesterol 200mg , calories 30% from fat and 7% or less from saturated fats, daily to have 5 or more servings of fruits and vegetables.  Biometrics:     Pre Biometrics - 03/11/16 0855    Pre Biometrics   Waist Circumference 42.5 inches   Hip Circumference 41 inches   Waist to Hip Ratio 1.04 %   Triceps Skinfold 19 mm   % Body Fat 30 %   Grip Strength 54 kg   Flexibility 0 in   Single Leg Stand 8.84 seconds       Nutrition Therapy Plan and Nutrition Goals:     Nutrition Therapy & Goals - 03/11/16 1115    Nutrition Therapy   Diet Therapeutic Lifestyle  Changes   Personal Nutrition Goals   Personal Goal #1 0.5-2 lb wt loss per week to a goal wt loss of 6-20 lb at graduation from [place], educate and counsel regarding individualized specific dietary modifications aiming towards targeted core components such as weight, hypertension, lipid management, diabetes, heart failure and other comorbidities.   Expected Outcomes Short Term Goal: Understand basic principles of dietary content, such as calories, fat, sodium, cholesterol and nutrients.;Long Term Goal: Adherence to prescribed nutrition plan.      Nutrition Discharge: Nutrition Scores:     Nutrition Assessments - 03/12/16 1024    MEDFICTS Scores   Pre Score 52      Nutrition Goals Re-Evaluation:   Psychosocial: Target Goals: Acknowledge presence or absence of depression,  maximize coping skills, provide positive support system. Participant is able to verbalize types and ability to use techniques and skills needed for reducing stress and depression.  Initial Review & Psychosocial Screening:     Initial Psych Review & Screening - 03/15/16 [place]? Yes   Barriers   Psychosocial barriers to participate in program There are no identifiable barriers or psychosocial needs.   Screening Interventions   Interventions Encouraged to exercise      Quality of Life Scores:     Quality of Life - 03/19/16 1653    Quality of Life Scores   GLOBAL Pre --  overall good QOL scores, pt reports no major concerns       PHQ-9:     Recent Review Flowsheet Data    Depression screen [facility] 2/9 03/15/2016 03/08/2016 11/14/2013   Decreased Interest 0 0 0   Down, Depressed, Hopeless - 0 0   PHQ - 2 Score 0 0 0      Psychosocial Evaluation and Intervention:     Psychosocial Evaluation - 04/21/16 1647    Psychosocial Evaluation & Interventions   Interventions Stress management education;Relaxation  education;Encouraged to exercise with the program and follow exercise prescription   Continued Psychosocial Services Needed Yes      Psychosocial Re-Evaluation:     Psychosocial Re-Evaluation      05/12/16 1732           Psychosocial Re-Evaluation   Interventions Encouraged to attend Cardiac Rehabilitation for the exercise;Relaxation education;Stress management education       Continued Psychosocial Services Needed Yes          Vocational Rehabilitation: Provide vocational rehab assistance to qualifying candidates.   Vocational Rehab Evaluation & Intervention:     Vocational Rehab - 03/19/16 1658    Initial Vocational Rehab Evaluation & Intervention   Assessment shows need for Vocational Rehabilitation No      Education: Education Goals: Education classes will be provided on a weekly basis, covering required topics. Participant will state understanding/return demonstration of topics presented.  Learning Barriers/Preferences:     Learning Barriers/Preferences - 03/11/16 0854    Learning Barriers/Preferences   Learning Barriers Sight;Hearing  glasses, bilateral hearing aids   Learning Preferences Skilled Demonstration      Education Topics: Count Your Pulse:  -Group instruction provided by verbal instruction, demonstration, patient participation and written materials to support subject.  Instructors address importance of being able to find your pulse and how to count your pulse when at home without a heart monitor.  Patients get hands on experience counting their pulse with staff help and individually.      CARDIAC REHAB PHASE II EXERCISE from 05/12/2016 in [place]   Date  05/07/16   Educator  Maurice Small, RN   Instruction Review Code  R- Review/reinforce      Heart Attack, Angina, and Risk Factor Modification:  -Group instruction provided by verbal instruction, video, and written materials to support subject.  Instructors  address signs and symptoms of angina and heart attacks.    Also discuss risk factors for heart disease and how to make changes to improve heart health risk factors.      CARDIAC REHAB PHASE II EXERCISE from 05/12/2016 in [place]   Date  04/23/16   Instruction Review Code  2- meets goals/outcomes      Functional Fitness:  -Group instruction provided by  verbal instruction, demonstration, patient participation, and written materials to support subject.  Instructors address safety measures for doing things around the house.  Discuss how to get up and down off the floor, how to pick things up properly, how to safely get out of a chair without assistance, and balance training.      CARDIAC REHAB PHASE II EXERCISE from 05/12/2016 in [place]   Date  03/19/16   Instruction Review Code  2- meets goals/outcomes      Meditation and Mindfulness:  -Group instruction provided by verbal instruction, patient participation, and written materials to support subject.  Instructor addresses importance of mindfulness and meditation practice to help reduce stress and improve awareness.  Instructor also leads participants through a meditation exercise.       CARDIAC REHAB PHASE II EXERCISE from 05/12/2016 in [place]   Date  03/24/16   Educator  Jeanella Craze   Instruction Review Code  2- meets goals/outcomes      Stretching for Flexibility and Mobility:  -Group instruction provided by verbal instruction, patient participation, and written materials to support subject.  Instructors lead participants through series of stretches that are designed to increase flexibility thus improving mobility.  These stretches are additional exercise for major muscle groups that are typically performed during regular warm up and cool down.      CARDIAC REHAB PHASE II EXERCISE from 05/12/2016 in [place]   Date  04/21/16   Instruction Review Code  2- meets goals/outcomes      Hands Only CPR Anytime:  -Group instruction provided by verbal instruction, video, patient participation and written materials to support subject.  Instructors co-teach with [facility]HA video for hands only CPR.  Participants get hands on experience with mannequins.      CARDIAC REHAB PHASE II EXERCISE from 05/12/2016 in [place]   Date  04/16/16   Instruction Review Code  2- meets goals/outcomes      Nutrition I class: Heart Healthy Eating:  -Group instruction provided by PowerPoint slides, verbal discussion, and written materials to support subject matter. The instructor gives an explanation and review of the Therapeutic Lifestyle Changes diet recommendations, which includes a discussion on lipid goals, dietary fat, sodium, fiber, plant stanol/sterol esters, sugar, and the components of a well-balanced, healthy diet.      CARDIAC REHAB PHASE II EXERCISE from 05/12/2016 in [place]   Date  04/06/16   Educator  RD   Instruction Review Code  2- meets goals/outcomes [Class handout given]      Nutrition II class: Lifestyle Skills:  -Group instruction provided by PowerPoint slides, verbal discussion, and written materials to support subject matter. The instructor gives an explanation and review of label reading, grocery shopping for heart health, heart healthy recipe modifications, and ways to make healthier choices when eating out.      CARDIAC REHAB PHASE II EXERCISE from 05/12/2016 in [place]   Date  04/13/16   Educator  RD   Instruction Review Code  2- meets goals/outcomes [Class handouts given]      Diabetes Question & Answer:  -Group instruction provided by PowerPoint slides, verbal discussion, and written materials to support subject matter. The instructor gives an explanation and review of diabetes  co-morbidities, pre- and post-prandial blood glucose goals, pre-exercise blood glucose goals, signs, symptoms, and treatment of hypoglycemia and hyperglycemia,  and foot care basics.      CARDIAC REHAB PHASE II EXERCISE from 05/12/2016 in [place]   Date  04/09/16   Educator  RD   Instruction Review Code  2- meets goals/outcomes      Diabetes Blitz:  -Group instruction provided by PowerPoint slides, verbal discussion, and written materials to support subject matter. The instructor gives an explanation and review of the physiology behind type 1 and type 2 diabetes, diabetes medications and rational behind using different medications, pre- and post-prandial blood glucose recommendations and Hemoglobin A1c goals, diabetes diet, and exercise including blood glucose guidelines for exercising safely.    Portion Distortion:  -Group instruction provided by PowerPoint slides, verbal discussion, written materials, and food models to support subject matter. The instructor gives an explanation of serving size versus portion size, changes in portions sizes over the last 20 years, and what consists of a serving from each food group.      CARDIAC REHAB PHASE II EXERCISE from 05/12/2016 in [place]   Date  04/28/16   Educator  RD   Instruction Review Code  2- meets goals/outcomes      Stress Management:  -Group instruction provided by verbal instruction, video, and written materials to support subject matter.  Instructors review role of stress in heart disease and how to cope with stress positively.     Exercising on Your Own:  -Group instruction provided by verbal instruction, power point, and written materials to support subject.  Instructors discuss benefits of exercise, components of exercise, frequency and intensity of exercise, and end points for exercise.  Also discuss use of nitroglycerin and activating EMS.  Review options of places  to exercise outside of rehab.  Review guidelines for sex with heart disease.   Cardiac Drugs I:  -Group instruction provided by verbal instruction and written materials to support subject.  Instructor reviews cardiac drug classes: antiplatelets, anticoagulants, beta blockers, and statins.  Instructor discusses reasons, side effects, and lifestyle considerations for each drug class.      CARDIAC REHAB PHASE II EXERCISE from 05/12/2016 in [place]   Date  04/14/16   Educator  Clydia Llano      Cardiac Drugs II:  -Group instruction provided by verbal instruction and written materials to support subject.  Instructor reviews cardiac drug classes: angiotensin converting enzyme inhibitors (ACE-I), angiotensin II receptor blockers (ARBs), nitrates, and calcium channel blockers.  Instructor discusses reasons, side effects, and lifestyle considerations for each drug class.      CARDIAC REHAB PHASE II EXERCISE from 05/12/2016 in [place]   Date  05/12/16   Instruction Review Code  2- meets goals/outcomes      Anatomy and Physiology of the Circulatory System:  -Group instruction provided by verbal instruction, video, and written materials to support subject.  Reviews functional anatomy of heart, how it relates to various diagnoses, and what role the heart plays in the overall system.          CARDIAC REHAB PHASE II EXERCISE from 05/12/2016 in [place]   Date  03/17/16   Instruction Review Code  2- meets goals/outcomes      Knowledge Questionnaire Score:     Knowledge Questionnaire Score - 03/11/16 0945    Knowledge Questionnaire Score   Pre Score 20/24      Core Components/Risk Factors/Patient Goals at Admission:  Personal Goals and Risk Factors at Admission - 03/11/16 0833    Core Components/Risk Factors/Patient Goals on Admission    Weight Management Yes;Weight Loss   Intervention  Weight Management/Obesity: Establish reasonable short term and long term weight goals.;Weight Management: Develop a combined nutrition and exercise program designed to reach desired caloric intake, while maintaining appropriate intake of nutrient and fiber, sodium and fats, and appropriate energy expenditure required for the weight goal.;Weight Management: Provide education and appropriate resources to help participant work on and attain dietary goals.   Admit Weight 201 lb 4.5 oz (91.3 kg)   Goal Weight: Short Term 195 lb (88.451 kg)   Goal Weight: Long Term 190 lb (86.183 kg)   Expected Outcomes Weight Loss: Understanding of general recommendations for a balanced deficit meal plan, which promotes 1-2 lb weight loss per week and includes a negative energy balance of [phone] kcal/d;Understanding recommendations for meals to include 15-35% energy as protein, 25-35% energy from fat, 35-60% energy from carbohydrates, less than 200mg  of dietary cholesterol, 20-35 gm of total fiber daily;Understanding of distribution of calorie intake throughout the day with the consumption of 4-5 meals/snacks;Long Term: Adherence to nutrition and physical activity/exercise program aimed toward attainment of established weight goal;Short Term: Continue to assess and modify interventions until short term weight is achieved   Sedentary Yes   Intervention Develop an individualized exercise prescription for aerobic and resistive training based on initial evaluation findings, risk stratification, comorbidities and participant's personal goals.;Provide advice, education, support and counseling about physical activity/exercise needs.   Expected Outcomes Achievement of increased cardiorespiratory fitness and enhanced flexibility, muscular endurance and strength shown through measurements of functional capacity and personal statement of participant.   Increase Strength and Stamina Yes   Intervention Provide advice, education, support  and counseling about physical activity/exercise needs.;Develop an individualized exercise prescription for aerobic and resistive training based on initial evaluation findings, risk stratification, comorbidities and participant's personal goals.   Expected Outcomes Achievement of increased cardiorespiratory fitness and enhanced flexibility, muscular endurance and strength shown through measurements of functional capacity and personal statement of participant.   Tobacco Cessation Yes  quit 2002   Hypertension Yes   Intervention Provide education on lifestyle modifcations including regular physical activity/exercise, weight management, moderate sodium restriction and increased consumption of fresh fruit, vegetables, and low fat dairy, alcohol moderation, and smoking cessation.;Monitor prescription use compliance.   Expected Outcomes Short Term: Continued assessment and intervention until BP is < 140/20mm HG in hypertensive participants. < 130/47mm HG in hypertensive participants with diabetes, heart failure or chronic kidney disease.;Long Term: Maintenance of blood pressure at goal levels.   Lipids Yes   Intervention Provide education and support for participant on nutrition & aerobic/resistive exercise along with prescribed medications to achieve LDL 70mg , HDL >40mg .   Expected Outcomes Short Term: Participant states understanding of desired cholesterol values and is compliant with medications prescribed. Participant is following exercise prescription and nutrition guidelines.;Long Term: Cholesterol controlled with medications as prescribed, with individualized exercise RX and with personalized nutrition plan. Value goals: LDL < 70mg , HDL > 40 mg.   Personal Goal Other Yes   Personal Goal short: Learn limitations and get back to normal long: increased flexibility, balance and increase energy   Intervention provide education on exercising safely,increased exercise capacity,  and increase  knowledge/awareness on CVD,    Expected Outcomes able to exercise safely on own and get back to normal activities and improved balance and flexibilty      Core Components/Risk Factors/Patient Goals  Review:      Goals and Risk Factor Review      04/14/16 1706 05/13/16 1633         Core Components/Risk Factors/Patient Goals Review   Personal Goals Review Increase Strength and Stamina;Weight Management/Obesity Increase Strength and Stamina      Review Energy and stamina is improving. Plans to go to [facility] to swim for more exercise and has lost 4-5 lbs  Pt energy levels and flexibility have improved greatly. Pt is walking 5 miles/day outdoors. Discussed temperature precautions and pt understands      Expected Outcomes Pt will continue to lose weight, have increased energy and do home exercise program Pt will continue with HEP, in order to maintain flexibility and energy levels         Core Components/Risk Factors/Patient Goals at Discharge (Final Review):      Goals and Risk Factor Review - 05/13/16 1633    Core Components/Risk Factors/Patient Goals Review   Personal Goals Review Increase Strength and Stamina   Review Pt energy levels and flexibility have improved greatly. Pt is walking 5 miles/day outdoors. Discussed temperature precautions and pt understands   Expected Outcomes Pt will continue with HEP, in order to maintain flexibility and energy levels      ITP Comments:     ITP Comments      03/11/16 0816 03/11/16 0853         ITP Comments Dr. Fransico Him, Medical Director Dr. Fransico Him, Medical Director         Comments: Pt is making expected progress toward personal goals after completing 26 sessions. Recommend continued exercise and life style modification education including  stress management and relaxation techniques to decrease cardiac risk profile.

## 2016-05-14 ENCOUNTER — Encounter (HOSPITAL_COMMUNITY)
Admission: RE | Admit: 2016-05-14 | Discharge: 2016-05-14 | Disposition: A | Discharge status: Home / Self Care | Payer: Medicare PPO | Source: Ambulatory Visit | Attending: Cardiology | Admitting: Cardiology

## 2016-05-14 DIAGNOSIS — I214 Non-ST elevation (NSTEMI) myocardial infarction: Secondary | ICD-10-CM

## 2016-05-14 DIAGNOSIS — Z955 Presence of coronary angioplasty implant and graft: Secondary | ICD-10-CM

## 2016-05-17 ENCOUNTER — Encounter (HOSPITAL_COMMUNITY)
Admission: RE | Admit: 2016-05-17 | Discharge: 2016-05-17 | Disposition: A | Discharge status: Home / Self Care | Payer: Medicare PPO | Source: Ambulatory Visit | Attending: Cardiology | Admitting: Cardiology

## 2016-05-17 DIAGNOSIS — I214 Non-ST elevation (NSTEMI) myocardial infarction: Secondary | ICD-10-CM | POA: Diagnosis not present

## 2016-05-17 DIAGNOSIS — Z955 Presence of coronary angioplasty implant and graft: Secondary | ICD-10-CM

## 2016-05-19 ENCOUNTER — Encounter (HOSPITAL_COMMUNITY)
Admission: RE | Admit: 2016-05-19 | Discharge: 2016-05-19 | Disposition: A | Discharge status: Home / Self Care | Payer: Medicare PPO | Source: Ambulatory Visit | Attending: Cardiology | Admitting: Cardiology

## 2016-05-19 DIAGNOSIS — Z955 Presence of coronary angioplasty implant and graft: Secondary | ICD-10-CM

## 2016-05-19 DIAGNOSIS — I214 Non-ST elevation (NSTEMI) myocardial infarction: Secondary | ICD-10-CM | POA: Diagnosis not present

## 2016-05-20 ENCOUNTER — Encounter: Payer: Self-pay | Admitting: Family Medicine

## 2016-05-21 ENCOUNTER — Other Ambulatory Visit (HOSPITAL_BASED_OUTPATIENT_CLINIC_OR_DEPARTMENT_OTHER): Payer: Medicare PPO

## 2016-05-21 ENCOUNTER — Ambulatory Visit (HOSPITAL_BASED_OUTPATIENT_CLINIC_OR_DEPARTMENT_OTHER): Payer: Medicare PPO

## 2016-05-21 ENCOUNTER — Ambulatory Visit (HOSPITAL_BASED_OUTPATIENT_CLINIC_OR_DEPARTMENT_OTHER): Payer: Medicare PPO | Admitting: Family

## 2016-05-21 ENCOUNTER — Encounter (HOSPITAL_COMMUNITY)
Admission: RE | Admit: 2016-05-21 | Discharge: 2016-05-21 | Disposition: A | Discharge status: Home / Self Care | Payer: Medicare PPO | Source: Ambulatory Visit | Attending: Cardiology | Admitting: Cardiology

## 2016-05-21 VITALS — BP 116/66 | HR 50 | Temp 98.3°F | Resp 16 | Ht 70.0 in | Wt 192.0 lb

## 2016-05-21 DIAGNOSIS — E340 Carcinoid syndrome: Secondary | ICD-10-CM

## 2016-05-21 DIAGNOSIS — C7A8 Other malignant neuroendocrine tumors: Secondary | ICD-10-CM | POA: Diagnosis not present

## 2016-05-21 DIAGNOSIS — D499 Neoplasm of unspecified behavior of unspecified site: Secondary | ICD-10-CM

## 2016-05-21 DIAGNOSIS — Z955 Presence of coronary angioplasty implant and graft: Secondary | ICD-10-CM

## 2016-05-21 DIAGNOSIS — C7B8 Other secondary neuroendocrine tumors: Secondary | ICD-10-CM

## 2016-05-21 DIAGNOSIS — I214 Non-ST elevation (NSTEMI) myocardial infarction: Secondary | ICD-10-CM

## 2016-05-21 DIAGNOSIS — C787 Secondary malignant neoplasm of liver and intrahepatic bile duct: Secondary | ICD-10-CM

## 2016-05-21 LAB — COMPREHENSIVE METABOLIC PANEL
ALT: 15 U/L (ref 0–55)
AST: 28 U/L (ref 5–34)
Albumin: 3.4 g/dL — ABNORMAL LOW (ref 3.5–5.0)
Alkaline Phosphatase: 76 U/L (ref 40–150)
Anion Gap: 9 mEq/L (ref 3–11)
BUN: 16.2 mg/dL (ref 7.0–26.0)
CALCIUM: 9.4 mg/dL (ref 8.4–10.4)
CHLORIDE: 103 meq/L (ref 98–109)
CO2: 29 meq/L (ref 22–29)
Creatinine: 0.9 mg/dL (ref 0.7–1.3)
EGFR: 83 mL/min/{1.73_m2} — ABNORMAL LOW (ref >90)
Glucose: 98 mg/dl (ref 70–140)
POTASSIUM: 4.1 meq/L (ref 3.5–5.1)
SODIUM: 141 meq/L (ref 136–145)
Total Bilirubin: 0.99 mg/dL (ref 0.20–1.20)
Total Protein: 7.6 g/dL (ref 6.4–8.3)

## 2016-05-21 LAB — CBC WITH DIFFERENTIAL (CANCER CENTER ONLY)
BASO#: 0 10*3/uL (ref 0.0–0.2)
BASO%: 0.4 % (ref 0.0–2.0)
EOS%: 4.9 % (ref 0.0–7.0)
Eosinophils Absolute: 0.4 10*3/uL (ref 0.0–0.5)
HEMATOCRIT: 38 % — AB (ref 38.7–49.9)
HGB: 12.2 g/dL — ABNORMAL LOW (ref 13.0–17.1)
LYMPH#: 1.3 10*3/uL (ref 0.9–3.3)
LYMPH%: 17.7 % (ref 14.0–48.0)
MCH: 28.1 pg (ref 28.0–33.4)
MCHC: 32.1 g/dL (ref 32.0–35.9)
MCV: 88 fL (ref 82–98)
MONO#: 0.5 10*3/uL (ref 0.1–0.9)
MONO%: 6.4 % (ref 0.0–13.0)
NEUT#: 5.1 10*3/uL (ref 1.5–6.5)
NEUT%: 70.6 % (ref 40.0–80.0)
PLATELETS: 160 10*3/uL (ref 145–400)
RBC: 4.34 10*6/uL (ref 4.20–5.70)
RDW: 14.9 % (ref 11.1–15.7)
WBC: 7.2 10*3/uL (ref 4.0–10.0)

## 2016-05-21 LAB — LACTATE DEHYDROGENASE: LDH: 211 U/L (ref 125–245)

## 2016-05-21 MED ORDER — LANREOTIDE ACETATE 120 MG/0.5ML ~~LOC~~ SOLN
120.0000 mg | Freq: Once | SUBCUTANEOUS | Status: AC
  Administered 2016-05-21: 120 mg via SUBCUTANEOUS
  Filled 2016-05-21: qty 120

## 2016-05-21 NOTE — Progress Notes (Addendum)
Hematology and Oncology Follow Up Visit  KALUM WALLAR [id number] [birth year] 70 y.o. 05/21/2016   Principle Diagnosis:  Metastatic low grade neuroendocrine tumor-hepatic metastases  Current Therapy:   S/p third yttrium-90 intrahepatic therapy - November 2016 Somatuline 120 mg monthly    Interim History: Mr. Mata is here today for a follow-up. He is doing quite well and has finished cardiac rehab. Her has had no other cardiac events. He is staying active walking 5 mi or biking 10 mi each day.  He is eating healthy and staying well hydrated. His weight is stable. He is happy to be down 6 lbs with all the exercise.  No fever, chills, n/v, cough, rash, dizziness, SOB, chest pain, palpitations, abdominal pain or changes in bowel or bladder habits.  No swelling, tenderness, numbness or tingling in his extremities. No c/o joint aches and "bone" pain.    Medications:    Medication List       This list is accurate as of: 05/21/16  1:35 PM.  Always use your most recent med list.               amLODipine 5 MG tablet  Commonly known as:  NORVASC  TAKE 1 TABLET DAILY     aspirin EC 81 MG tablet  Take 81 mg by mouth every morning.     atorvastatin 80 MG tablet  Commonly known as:  LIPITOR  Take 1 tablet (80 mg total) by mouth daily at 6 PM.     Cholecalciferol 1000 units tablet  Take 1,000 Units by mouth daily.     CREON 24000 units Cpep  Generic drug:  Pancrelipase (Lip-Prot-Amyl)  Take 24,000 Units by mouth 3 (three) times daily with meals.     diphenhydrAMINE 50 MG tablet  Commonly known as:  BENADRYL  Take 1 tablet (50 mg total) by mouth once. Take one hour before study     Fish Oil 1000 MG Caps  Take 1 capsule by mouth daily. Reported on 12/30/2015     fluticasone 50 MCG/ACT nasal spray  Commonly known as:  FLONASE  Place 2 sprays into both nostrils daily as needed (sinuses).     hydrocortisone 25 MG suppository  Commonly known as:  ANUSOL-HC  INSERT 1 SUPPOSITORY  RECTALLY TWICE A DAY AS NEEDED FOR HEMORRHOIDS     lansoprazole 30 MG capsule  Commonly known as:  PREVACID  Take 1 capsule (30 mg total) by mouth daily at 12 noon.     levothyroxine 50 MCG tablet  Commonly known as:  SYNTHROID, LEVOTHROID  TAKE 1 TABLET DAILY.     losartan 100 MG tablet  Commonly known as:  COZAAR  Take 1 tablet (100 mg total) by mouth every morning.     metoCLOPramide 10 MG tablet  Commonly known as:  REGLAN  Take 10 mg by mouth daily.     metoprolol tartrate 25 MG tablet  Commonly known as:  LOPRESSOR  Take 1 tablet (25 mg total) by mouth 2 (two) times daily.     nitroGLYCERIN 0.4 MG SL tablet  Commonly known as:  NITROSTAT  Place 1 tablet (0.4 mg total) under the tongue every 5 (five) minutes x 3 doses as needed for chest pain.     PARoxetine 10 MG tablet  Commonly known as:  PAXIL  Take 1 tablet (10 mg total) by mouth daily.     predniSONE 50 MG tablet  Commonly known as:  DELTASONE  Take 50mg  at 13 hours, 7  hours, and 1 hour before scan     sodium chloride 0.65 % Soln nasal spray  Commonly known as:  OCEAN  Place 1 spray into both nostrils as needed for congestion.     SOMATULINE DEPOT [place]  Inject 120 mcg into the skin every 28 (twenty-eight) days. Receives at Dr Antonieta Pert office     ticagrelor 90 MG Tabs tablet  Commonly known as:  BRILINTA  Take 1 tablet (90 mg total) by mouth 2 (two) times daily.     vitamin B-12 1000 MCG tablet  Commonly known as:  CYANOCOBALAMIN  Take 1,000 mcg by mouth daily.        Allergies:  Allergies  Allergen Reactions  . Iohexol Hives     Code: HIVES, Desc: PER [name] @ [facility]IMARY [facility]ARE, PT IS ALLERGIC TO CONTRAST DYE 10/02/08/RM  05/01/10...needs full premeds per our protocol w/ [facility]., Onset Date: [id number]     Past Medical History, Surgical history, Social history, and Family History were reviewed and updated.  Review of Systems: All other 10 point review of systems is negative.   Physical Exam:   vitals were not taken for this visit.  Wt Readings from Last 3 Encounters:  03/19/16 198 lb (89.812 kg)  03/11/16 201 lb 4.5 oz (91.3 kg)  03/08/16 201 lb 12 oz (91.513 kg)    Ocular: Sclerae unicteric, pupils equal, round and reactive to light Ear-nose-throat: Oropharynx clear, dentition fair Lymphatic: No cervical supraclavicular or axillary adenopathy Lungs no rales or rhonchi, good excursion bilaterally Heart regular rate and rhythm, no murmur appreciated Abd soft, nontender, positive bowel sounds, no spleen or liver tip palpated on exam, no fluid wave MSK no focal spinal tenderness, no joint edema Neuro: non-focal, well-oriented, appropriate affect Breasts: Deferred  Lab Results  Component Value Date   WBC 7.0 03/19/2016   HGB 12.5* 03/19/2016   HCT 38.0* 03/19/2016   MCV 86 03/19/2016   PLT 162 03/19/2016   No results found for: FERRITIN, IRON, TIBC, UIBC, IRONPCTSAT Lab Results  Component Value Date   RBC 4.40 03/19/2016   No results found for: KPAFRELGTCHN, LAMBDASER, KAPLAMBRATIO No results found for: IGGSERUM, IGA, IGMSERUM No results found for: Kathrynn Ducking, MSPIKE, SPEI   Chemistry      Component Value Date/Time   NA 137 03/19/2016 1029   NA 139 02/11/2016 0323   NA 141 01/16/2016 1125   K 4.0 03/19/2016 1029   K 4.4 02/11/2016 0323   K 4.1 01/16/2016 1125   CL 100 03/19/2016 1029   CL 104 02/11/2016 0323   CO2 29 03/19/2016 1029   CO2 25 02/11/2016 0323   CO2 30* 01/16/2016 1125   BUN 18 03/19/2016 1029   BUN 12 02/11/2016 0323   BUN 14.2 01/16/2016 1125   CREATININE 1.1 03/19/2016 1029   CREATININE 0.87 02/11/2016 0323   CREATININE 0.9 01/16/2016 1125      Component Value Date/Time   CALCIUM 9.4 03/19/2016 1029   CALCIUM 8.8* 02/11/2016 0323   CALCIUM 9.3 01/16/2016 1125   ALKPHOS 65 03/19/2016 1029   ALKPHOS 62 02/09/2016 1203   ALKPHOS 73 01/16/2016 1125   AST 30 03/19/2016 1029   AST 26  02/09/2016 1203   AST 23 01/16/2016 1125   ALT 23 03/19/2016 1029   ALT 15 02/09/2016 1203   ALT 16 01/16/2016 1125   BILITOT 1.00 03/19/2016 1029   BILITOT 0.5 02/09/2016 1203   BILITOT 0.57 01/16/2016 1125  Impression and Plan: Mr. Aronow is 70 year old gentleman with a neuroendocrine carcinoma and liver metastases. He had a third intrahepatic therapy with yttrium-90 back in November 2016. He is doing well on Somatuline but did have a question about the cost. He is being billed $1300 for each injection. He will meet with Baxter Flattery today to look at working on getting this down.  His Chromogranin A was 11 in May. Level today is pending.  We will proceed with his injection today as planned. He will continue on his monthly injection schedule.  His quality of life at this time is quite good.  We will plan to see him back in 2 month for labs and follow-up.  He knows to contact us with any questions or concerns. We can certainly see him sooner if need be.   Eliezer Bottom, NP 7/21/20171:35 PM

## 2016-05-21 NOTE — Patient Instructions (Signed)
Lanreotide injection What is this medicine? LANREOTIDE (lan REE oh tide) is used to reduce blood levels of growth hormone in patients with a condition called acromegaly. It also works to slow or stop tumor growth in patients with gastroenteropancreatic neuroendocrine tumor (GEP-NET). This medicine may be used for other purposes; ask your health care provider or pharmacist if you have questions. What should I tell my health care provider before I take this medicine? They need to know if you have any of these conditions: -diabetes -gallbladder disease -heart disease -kidney disease -liver disease -an unusual or allergic reaction to lanreotide, other medicines, latex, foods, dyes, or preservatives -pregnant or trying to get pregnant -breast-feeding How should I use this medicine? This medicine is for injection under the skin. It is given by a health care professional in a hospital or clinic setting. Contact your pediatrician or health care professional regarding the use of this medicine in children. Special care may be needed. Overdosage: If you think you have taken too much of this medicine contact a poison control center or emergency room at once. NOTE: This medicine is only for you. Do not share this medicine with others. What if I miss a dose? It is important not to miss your dose. Call your doctor or health care professional if you are unable to keep an appointment. What may interact with this medicine? -bromocriptine -cyclosporine -medicines for diabetes, including insulin -medicines for heart disease or hypertension -quinidine This list may not describe all possible interactions. Give your health care provider a list of all the medicines, herbs, non-prescription drugs, or dietary supplements you use. Also tell them if you smoke, drink alcohol, or use illegal drugs. Some items may interact with your medicine. What should I watch for while using this medicine? Visit your doctor or  health care professional for regular checks on your progress. Your condition will be monitored carefully while you are receiving this medicine. This medicine may cause increases or decreases in blood sugar. Signs of high blood sugar include frequent urination, unusual thirst, flushed or dry skin, difficulty breathing, drowsiness, stomach ache, nausea, vomiting or dry mouth. Signs of low blood sugar include chills, cool, pale skin or cold sweats, drowsiness, extreme hunger, fast heartbeat, headache, nausea, nervousness or anxiety, shakiness, trembling, unsteadiness, tiredness, or weakness. Contact your doctor or health care professional right away if you experience any of these symptoms. What side effects may I notice from receiving this medicine? Side effects that you should report to your doctor or health care professional as soon as possible: -allergic reactions like skin rash, itching or hives, swelling of the face, lips, or tongue -changes in blood sugar -changes in heart rate -severe stomach pain Side effects that usually do not require medical attention (report to your doctor or health care professional if they continue or are bothersome): -diarrhea or constipation -gas or stomach pain -nausea, vomiting -pain, redness, swelling and irritation at site where injected This list may not describe all possible side effects. Call your doctor for medical advice about side effects. You may report side effects to FDA at 1-800-FDA-1088. Where should I keep my medicine? This drug is given in a hospital or clinic and will not be stored at home. NOTE: This sheet is a summary. It may not cover all possible information. If you have questions about this medicine, talk to your doctor, pharmacist, or health care provider.    2016, Elsevier/Gold Standard. (2013-10-17 17:43:04)

## 2016-05-24 ENCOUNTER — Encounter (HOSPITAL_COMMUNITY)
Admission: RE | Admit: 2016-05-24 | Discharge: 2016-05-24 | Disposition: A | Discharge status: Home / Self Care | Payer: Medicare PPO | Source: Ambulatory Visit | Attending: Cardiology | Admitting: Cardiology

## 2016-05-24 DIAGNOSIS — Z955 Presence of coronary angioplasty implant and graft: Secondary | ICD-10-CM

## 2016-05-24 DIAGNOSIS — I214 Non-ST elevation (NSTEMI) myocardial infarction: Secondary | ICD-10-CM

## 2016-05-24 LAB — CHROMOGRANIN A: CHROMOGRAN A: 10 nmol/L — AB (ref 0–5)

## 2016-05-24 NOTE — Progress Notes (Signed)
Discharge Summary  Patient Details  Name: Brian Mays MRN: [id number] Date of Birth: 1946/10/07 Referring Provider:   April Manson CARDIAC REHAB PHASE II ORIENTATION from 03/11/2016 in [place]  Referring Provider  Kela Millin MD       Number of Visits: 31  Reason for Discharge:  Patient reached a stable level of exercise. Patient independent in their exercise.  Smoking History:  History  Smoking Status  . Former Smoker  . Packs/day: 1.50  . Years: 35.00  . Types: Cigarettes  . Start date: 09/25/1959  . Quit date: 12/21/1997  Smokeless Tobacco  . Never Used    Diagnosis:  NSTEMI (non-ST elevated myocardial infarction) ([place])  S/P coronary artery stent placement  ADL UCSD:   Initial Exercise Prescription:     Initial Exercise Prescription - 03/11/16 0900      Date of Initial Exercise RX and Referring Provider   Date 03/11/16   Referring Provider Kela Millin MD     Treadmill   MPH 2.1   Grade 1   Minutes 10   METs 2.9     Bike   Level 0.7   Minutes 10   METs 2.9     NuStep   Level 2   Minutes 10   METs 2     Prescription Details   Frequency (times per week) 3   Duration Progress to 30 minutes of continuous aerobic without signs/symptoms of physical distress     Intensity   THRR 40-80% of Max Heartrate 60-121   Ratings of Perceived Exertion 11-13     Progression   Progression Continue to progress workloads to maintain intensity without signs/symptoms of physical distress.     Resistance Training   Training Prescription Yes   Weight 2 lbs   Reps 10-12      Discharge Exercise Prescription (Final Exercise Prescription Changes):     Exercise Prescription Changes - 05/24/16 1112      Exercise Review   Progression Yes     Response to Exercise   Blood Pressure (Admit) 112/62   Blood Pressure (Exercise) 122/78   Blood Pressure (Exit) 122/68   Heart Rate (Admit) 58 bpm   Heart Rate  (Exercise) 77 bpm   Heart Rate (Exit) 56 bpm   Rating of Perceived Exertion (Exercise) 12   Duration Progress to 30 minutes of continuous aerobic without signs/symptoms of physical distress   Intensity THRR unchanged     Progression   Progression Continue to progress workloads to maintain intensity without signs/symptoms of physical distress.   Average METs 4.8     Resistance Training   Training Prescription Yes   Weight 4lbs   Reps 10-12     Treadmill   MPH 3.2   Grade 4   Minutes 10   METs 5.21     Bike   Level 1.2   Minutes 10   METs 3.59     NuStep   Level 5   Minutes 10   METs 4.4     Home Exercise Plan   Plans to continue exercise at [facility] (comment)  Reviewed HEP on 03/31/16. See progress note   Frequency Add 3 additional days to program exercise sessions.      Functional Capacity:     6 Minute Walk    Row Name 03/11/16 0914 05/21/16 1533       6 Minute Walk   Phase Initial Discharge    Distance 1500 feet 2000  feet    Walk Time 6 minutes 6 minutes    # of Rest Breaks 0 0    MPH 2.84 3.78    METS 2.98 4.14    RPE 11 12    VO2 Peak 10.44 14.5    Symptoms No No    Resting HR 59 bpm 57 bpm    Resting BP 122/70 110/72    Max Ex. HR 75 bpm 100 bpm    Max Ex. BP 124/72 122/70    2 Minute Post BP 124/62 104/72       Psychological, QOL, Others - Outcomes: PHQ 2/9: Depression screen [facility] 2/9 05/24/2016 03/15/2016 03/08/2016 11/14/2013  Decreased Interest 0 0 0 0  Down, Depressed, Hopeless 0 - 0 0  PHQ - 2 Score 0 0 0 0  Some recent data might be hidden    Quality of Life:     Quality of Life - 06/02/16 1530      Quality of Life Scores   Health/Function Post 26 %   Socioeconomic Post 27.43 %   Psych/Spiritual Post 28 %   Family Post 27.6 %   GLOBAL Post 26.91 %      Personal Goals: Goals established at orientation with interventions provided to work toward goal.     Personal Goals and Risk Factors at Admission - 03/11/16 0833       Core Components/Risk Factors/Patient Goals on Admission    Weight Management Yes;Weight Loss   Intervention Weight Management/Obesity: Establish reasonable short term and long term weight goals.;Weight Management: Develop a combined nutrition and exercise program designed to reach desired caloric intake, while maintaining appropriate intake of nutrient and fiber, sodium and fats, and appropriate energy expenditure required for the weight goal.;Weight Management: Provide education and appropriate resources to help participant work on and attain dietary goals.   Admit Weight 201 lb 4.5 oz (91.3 kg)   Goal Weight: Short Term 195 lb (88.5 kg)   Goal Weight: Long Term 190 lb (86.2 kg)   Expected Outcomes Weight Loss: Understanding of general recommendations for a balanced deficit meal plan, which promotes 1-2 lb weight loss per week and includes a negative energy balance of [phone] kcal/d;Understanding recommendations for meals to include 15-35% energy as protein, 25-35% energy from fat, 35-60% energy from carbohydrates, less than 217m of dietary cholesterol, 20-35 gm of total fiber daily;Understanding of distribution of calorie intake throughout the day with the consumption of 4-5 meals/snacks;Long Term: Adherence to nutrition and physical activity/exercise program aimed toward attainment of established weight goal;Short Term: Continue to assess and modify interventions until short term weight is achieved   Sedentary Yes   Intervention Develop an individualized exercise prescription for aerobic and resistive training based on initial evaluation findings, risk stratification, comorbidities and participant's personal goals.;Provide advice, education, support and counseling about physical activity/exercise needs.   Expected Outcomes Achievement of increased cardiorespiratory fitness and enhanced flexibility, muscular endurance and strength shown through measurements of functional capacity and personal  statement of participant.   Increase Strength and Stamina Yes   Intervention Provide advice, education, support and counseling about physical activity/exercise needs.;Develop an individualized exercise prescription for aerobic and resistive training based on initial evaluation findings, risk stratification, comorbidities and participant's personal goals.   Expected Outcomes Achievement of increased cardiorespiratory fitness and enhanced flexibility, muscular endurance and strength shown through measurements of functional capacity and personal statement of participant.   Tobacco Cessation Yes  quit 2002   Hypertension Yes   Intervention Provide education on  lifestyle modifcations including regular physical activity/exercise, weight management, moderate sodium restriction and increased consumption of fresh fruit, vegetables, and low fat dairy, alcohol moderation, and smoking cessation.;Monitor prescription use compliance.   Expected Outcomes Short Term: Continued assessment and intervention until BP is < 140/12m HG in hypertensive participants. < 130/837mHG in hypertensive participants with diabetes, heart failure or chronic kidney disease.;Long Term: Maintenance of blood pressure at goal levels.   Lipids Yes   Intervention Provide education and support for participant on nutrition & aerobic/resistive exercise along with prescribed medications to achieve LDL <7029mHDL >38m13m Expected Outcomes Short Term: Participant states understanding of desired cholesterol values and is compliant with medications prescribed. Participant is following exercise prescription and nutrition guidelines.;Long Term: Cholesterol controlled with medications as prescribed, with individualized exercise RX and with personalized nutrition plan. Value goals: LDL < 70mg18mL > 40 mg.   Personal Goal Other Yes   Personal Goal short: Learn limitations and get back to normal long: increased flexibility, balance and increase energy    Intervention provide education on exercising safely,increased exercise capacity,  and increase knowledge/awareness on CVD,    Expected Outcomes able to exercise safely on own and get back to normal activities and improved balance and flexibilty       Personal Goals Discharge:     Goals and Risk Factor Review    Row Name 04/14/16 1706 05/13/16 1633 06/04/16 1103 06/04/16 1104       Core Components/Risk Factors/Patient Goals Review   Personal Goals Review Increase Strength and Stamina;Weight Management/Obesity Increase Strength and Stamina Weight Management/Obesity Weight Management/Obesity    Review Energy and stamina is improving. Plans [facility] exercise and has lost 4-5 lbs  Pt energy levels and flexibility have improved greatly. Pt is walking 5 miles/day outdoors. Discussed temperature precautions and pt understands Pt wt is down 10.2 lb Pt wt is down 10.2 lb    Expected Outcomes Pt will continue to lose weight, have increased energy and do home exercise program Pt will continue with HEP, in order to maintain flexibility and energy levels  - 1-2 lb wt loss/week to a goal wt loss of 6-24 lb       Nutrition & Weight - Outcomes:     Pre Biometrics - 03/11/16 0855      Pre Biometrics   Waist Circumference 42.5 inches   Hip Circumference 41 inches   Waist to Hip Ratio 1.04 %   Triceps Skinfold 19 mm   % Body Fat 30 %   Grip Strength 54 kg   Flexibility 0 in   Single Leg Stand 8.84 seconds         Post Biometrics - 05/31/16 1110       Post  Biometrics   Waist Circumference 41.5 inches   Hip Circumference 40.25 inches   Waist to Hip Ratio 1.03 %   Triceps Skinfold 15 mm   % Body Fat 28.1 %   Grip Strength 52 kg   Flexibility 0 in   Single Leg Stand 30 seconds      Nutrition:     Nutrition Therapy & Goals - 03/11/16 1115      Nutrition Therapy   Diet Therapeutic Lifestyle Changes     Personal Nutrition Goals   Personal Goal #1 0.5-2 lb wt  loss per week to a goal wt loss of 6-20 lb [facility]  Prescribe, educate and counsel regarding individualized specific dietary modifications aiming towards targeted core components such as weight, hypertension, lipid management, diabetes, heart failure and other comorbidities.   Expected Outcomes Short Term Goal: Understand basic principles of dietary content, such as calories, fat, sodium, cholesterol and nutrients.;Long Term Goal: Adherence to prescribed nutrition plan.      Nutrition Discharge:     Nutrition Assessments - 06/04/16 1102      MEDFICTS Scores   Pre Score 52   Post Score 32   Score Difference -20      Education Questionnaire Score:     Knowledge Questionnaire Score - 05/31/16 1015      Knowledge Questionnaire Score   Post Score 22/24      Goals reviewed with patient.  Pt graduated from cardiac rehab program today with completion of 31 exercise sessions in Phase II. Pt maintained good attendance to exercise and education classes.  Pt  progressed nicely during his participation in rehab as evidenced by increased MET level.   Medication list reconciled. Repeat  PHQ score- 0 .    Pt takes paxil and feels this works well for him.  Pt with no further psychosocial needs, demonstrates positive and healthy coping skills. Pt plans to continue exercise by [facility]. On alternate days, pt will exercise by walking 5 miles or riding his bike for 10 miles.  It was a true delight to have this pt participates in cardiac rehab. Cherre Huger, BSN

## 2016-05-26 ENCOUNTER — Encounter (HOSPITAL_COMMUNITY): Payer: Medicare PPO

## 2016-05-28 ENCOUNTER — Encounter (HOSPITAL_COMMUNITY): Payer: Medicare PPO

## 2016-05-31 ENCOUNTER — Encounter (HOSPITAL_COMMUNITY): Payer: Medicare PPO

## 2016-06-02 ENCOUNTER — Encounter (HOSPITAL_COMMUNITY): Payer: Medicare PPO

## 2016-06-04 ENCOUNTER — Encounter (HOSPITAL_COMMUNITY): Payer: Medicare PPO

## 2016-06-07 ENCOUNTER — Encounter (HOSPITAL_COMMUNITY): Payer: Medicare PPO

## 2016-06-09 ENCOUNTER — Encounter (HOSPITAL_COMMUNITY): Payer: Medicare PPO

## 2016-06-11 ENCOUNTER — Encounter (HOSPITAL_COMMUNITY): Payer: Medicare PPO

## 2016-06-14 ENCOUNTER — Encounter (HOSPITAL_COMMUNITY): Payer: Medicare PPO

## 2016-06-16 ENCOUNTER — Encounter (HOSPITAL_COMMUNITY): Payer: Medicare PPO

## 2016-06-18 ENCOUNTER — Encounter (HOSPITAL_COMMUNITY): Payer: Medicare PPO

## 2016-06-21 ENCOUNTER — Other Ambulatory Visit: Payer: Self-pay | Admitting: Internal Medicine

## 2016-06-25 ENCOUNTER — Ambulatory Visit (HOSPITAL_BASED_OUTPATIENT_CLINIC_OR_DEPARTMENT_OTHER): Payer: Medicare PPO

## 2016-06-25 ENCOUNTER — Telehealth: Payer: Self-pay | Admitting: Behavioral Health

## 2016-06-25 VITALS — BP 130/70 | HR 53 | Temp 97.9°F | Resp 18

## 2016-06-25 DIAGNOSIS — C7B8 Other secondary neuroendocrine tumors: Secondary | ICD-10-CM | POA: Diagnosis not present

## 2016-06-25 DIAGNOSIS — E340 Carcinoid syndrome: Secondary | ICD-10-CM

## 2016-06-25 DIAGNOSIS — C7A8 Other malignant neuroendocrine tumors: Secondary | ICD-10-CM

## 2016-06-25 DIAGNOSIS — D499 Neoplasm of unspecified behavior of unspecified site: Secondary | ICD-10-CM

## 2016-06-25 MED ORDER — LANREOTIDE ACETATE 120 MG/0.5ML ~~LOC~~ SOLN
120.0000 mg | Freq: Once | SUBCUTANEOUS | Status: AC
  Administered 2016-06-25: 120 mg via SUBCUTANEOUS
  Filled 2016-06-25: qty 120

## 2016-06-25 NOTE — Telephone Encounter (Signed)
Patient rescheduled appointment for 07/29/16 at 9:30 AM.

## 2016-06-25 NOTE — Patient Instructions (Signed)
Lanreotide injection What is this medicine? LANREOTIDE (lan REE oh tide) is used to reduce blood levels of growth hormone in patients with a condition called acromegaly. It also works to slow or stop tumor growth in patients with gastroenteropancreatic neuroendocrine tumor (GEP-NET). This medicine may be used for other purposes; ask your health care provider or pharmacist if you have questions. What should I tell my health care provider before I take this medicine? They need to know if you have any of these conditions: -diabetes -gallbladder disease -heart disease -kidney disease -liver disease -an unusual or allergic reaction to lanreotide, other medicines, latex, foods, dyes, or preservatives -pregnant or trying to get pregnant -breast-feeding How should I use this medicine? This medicine is for injection under the skin. It is given by a health care professional in a hospital or clinic setting. Contact your pediatrician or health care professional regarding the use of this medicine in children. Special care may be needed. Overdosage: If you think you have taken too much of this medicine contact a poison control center or emergency room at once. NOTE: This medicine is only for you. Do not share this medicine with others. What if I miss a dose? It is important not to miss your dose. Call your doctor or health care professional if you are unable to keep an appointment. What may interact with this medicine? -bromocriptine -cyclosporine -medicines for diabetes, including insulin -medicines for heart disease or hypertension -quinidine This list may not describe all possible interactions. Give your health care provider a list of all the medicines, herbs, non-prescription drugs, or dietary supplements you use. Also tell them if you smoke, drink alcohol, or use illegal drugs. Some items may interact with your medicine. What should I watch for while using this medicine? Visit your doctor or  health care professional for regular checks on your progress. Your condition will be monitored carefully while you are receiving this medicine. This medicine may cause increases or decreases in blood sugar. Signs of high blood sugar include frequent urination, unusual thirst, flushed or dry skin, difficulty breathing, drowsiness, stomach ache, nausea, vomiting or dry mouth. Signs of low blood sugar include chills, cool, pale skin or cold sweats, drowsiness, extreme hunger, fast heartbeat, headache, nausea, nervousness or anxiety, shakiness, trembling, unsteadiness, tiredness, or weakness. Contact your doctor or health care professional right away if you experience any of these symptoms. What side effects may I notice from receiving this medicine? Side effects that you should report to your doctor or health care professional as soon as possible: -allergic reactions like skin rash, itching or hives, swelling of the face, lips, or tongue -changes in blood sugar -changes in heart rate -severe stomach pain Side effects that usually do not require medical attention (report to your doctor or health care professional if they continue or are bothersome): -diarrhea or constipation -gas or stomach pain -nausea, vomiting -pain, redness, swelling and irritation at site where injected This list may not describe all possible side effects. Call your doctor for medical advice about side effects. You may report side effects to FDA at 1-800-FDA-1088. Where should I keep my medicine? This drug is given in a hospital or clinic and will not be stored at home. NOTE: This sheet is a summary. It may not cover all possible information. If you have questions about this medicine, talk to your doctor, pharmacist, or health care provider.    2016, Elsevier/Gold Standard. (2013-10-17 17:43:04)

## 2016-06-28 ENCOUNTER — Ambulatory Visit: Payer: Medicare PPO | Admitting: Family Medicine

## 2016-07-16 ENCOUNTER — Other Ambulatory Visit: Payer: Self-pay | Admitting: Internal Medicine

## 2016-07-19 ENCOUNTER — Encounter: Payer: Self-pay | Admitting: Family Medicine

## 2016-07-20 MED ORDER — CREON 24000-76000 UNITS PO CPEP: 24000.0000 [IU] | ORAL_CAPSULE | Freq: Three times a day (TID) | ORAL | 3 refills | Status: DC

## 2016-07-23 ENCOUNTER — Ambulatory Visit (HOSPITAL_BASED_OUTPATIENT_CLINIC_OR_DEPARTMENT_OTHER): Payer: Medicare PPO | Admitting: Family

## 2016-07-23 ENCOUNTER — Ambulatory Visit (HOSPITAL_BASED_OUTPATIENT_CLINIC_OR_DEPARTMENT_OTHER): Payer: Medicare PPO

## 2016-07-23 ENCOUNTER — Other Ambulatory Visit: Payer: Self-pay | Admitting: Family

## 2016-07-23 ENCOUNTER — Other Ambulatory Visit (HOSPITAL_BASED_OUTPATIENT_CLINIC_OR_DEPARTMENT_OTHER): Payer: Medicare PPO

## 2016-07-23 ENCOUNTER — Encounter: Payer: Self-pay | Admitting: Family

## 2016-07-23 VITALS — BP 114/58 | HR 54 | Temp 97.8°F | Resp 16 | Ht 70.0 in | Wt 191.0 lb

## 2016-07-23 DIAGNOSIS — C7A8 Other malignant neuroendocrine tumors: Secondary | ICD-10-CM | POA: Diagnosis not present

## 2016-07-23 DIAGNOSIS — E340 Carcinoid syndrome: Secondary | ICD-10-CM

## 2016-07-23 DIAGNOSIS — C7B8 Other secondary neuroendocrine tumors: Secondary | ICD-10-CM

## 2016-07-23 DIAGNOSIS — D499 Neoplasm of unspecified behavior of unspecified site: Secondary | ICD-10-CM

## 2016-07-23 DIAGNOSIS — Z23 Encounter for immunization: Secondary | ICD-10-CM | POA: Diagnosis not present

## 2016-07-23 DIAGNOSIS — C787 Secondary malignant neoplasm of liver and intrahepatic bile duct: Secondary | ICD-10-CM

## 2016-07-23 LAB — CBC WITH DIFFERENTIAL (CANCER CENTER ONLY)
BASO#: 0 10*3/uL (ref 0.0–0.2)
BASO%: 0.5 % (ref 0.0–2.0)
EOS ABS: 0.3 10*3/uL (ref 0.0–0.5)
EOS%: 4.4 % (ref 0.0–7.0)
HEMATOCRIT: 36.4 % — AB (ref 38.7–49.9)
HEMOGLOBIN: 11.8 g/dL — AB (ref 13.0–17.1)
LYMPH#: 1.2 10*3/uL (ref 0.9–3.3)
LYMPH%: 17.6 % (ref 14.0–48.0)
MCH: 28 pg (ref 28.0–33.4)
MCHC: 32.4 g/dL (ref 32.0–35.9)
MCV: 87 fL (ref 82–98)
MONO#: 0.5 10*3/uL (ref 0.1–0.9)
MONO%: 7 % (ref 0.0–13.0)
NEUT%: 70.5 % (ref 40.0–80.0)
NEUTROS ABS: 4.6 10*3/uL (ref 1.5–6.5)
Platelets: 161 10*3/uL (ref 145–400)
RBC: 4.21 10*6/uL (ref 4.20–5.70)
RDW: 14.9 % (ref 11.1–15.7)
WBC: 6.6 10*3/uL (ref 4.0–10.0)

## 2016-07-23 LAB — COMPREHENSIVE METABOLIC PANEL
ALBUMIN: 3.3 g/dL — AB (ref 3.5–5.0)
ALK PHOS: 77 U/L (ref 40–150)
ALT: 13 U/L (ref 0–55)
ANION GAP: 9 meq/L (ref 3–11)
AST: 23 U/L (ref 5–34)
BUN: 13.8 mg/dL (ref 7.0–26.0)
CALCIUM: 9.2 mg/dL (ref 8.4–10.4)
CO2: 27 mEq/L (ref 22–29)
Chloride: 106 mEq/L (ref 98–109)
Creatinine: 1 mg/dL (ref 0.7–1.3)
EGFR: 80 mL/min/{1.73_m2} — AB (ref >90)
Glucose: 100 mg/dl (ref 70–140)
POTASSIUM: 4.1 meq/L (ref 3.5–5.1)
SODIUM: 141 meq/L (ref 136–145)
Total Bilirubin: 1.13 mg/dL (ref 0.20–1.20)
Total Protein: 7.4 g/dL (ref 6.4–8.3)

## 2016-07-23 LAB — LACTATE DEHYDROGENASE: LDH: 235 U/L (ref 125–245)

## 2016-07-23 MED ORDER — LANREOTIDE ACETATE 120 MG/0.5ML ~~LOC~~ SOLN
SUBCUTANEOUS | Status: AC
  Filled 2016-07-23: qty 120

## 2016-07-23 MED ORDER — INFLUENZA VAC SPLIT QUAD 0.5 ML IM SUSY
0.5000 mL | PREFILLED_SYRINGE | Freq: Once | INTRAMUSCULAR | Status: AC
  Administered 2016-07-23: 0.5 mL via INTRAMUSCULAR
  Filled 2016-07-23: qty 0.5

## 2016-07-23 MED ORDER — LANREOTIDE ACETATE 120 MG/0.5ML ~~LOC~~ SOLN
120.0000 mg | Freq: Once | SUBCUTANEOUS | Status: AC
  Administered 2016-07-23: 120 mg via SUBCUTANEOUS

## 2016-07-23 NOTE — Progress Notes (Signed)
Hematology and Oncology Follow Up Visit  Brian Mays [id number] [birth year] 70 y.o. 07/23/2016   Principle Diagnosis:  Metastatic low grade neuroendocrine tumor-hepatic metastases  Current Therapy:   S/p third yttrium-90 intrahepatic therapy - November 2016 Somatuline 120 mg monthly    Interim History: Brian Mays is here today for a follow-up. He continues to do well but has had some issues with GERD.  He plans to talk with Dr. Edilia Bo about this at his appointment next week. Her has had no other cardiac events. No c/o fatigue. He continues to stay active walking 5 miles a day.  No fever, chills, n/v, cough, rash, dizziness, SOB, chest pain, palpitations, abdominal pain or changes in bowel or bladder habits.  No swelling, tenderness, numbness or tingling in his extremities. No c/o joint aches and "bone" pain.   He has maintained a good appetite and is staying well hydrated. His weight is stable.   Medications:    Medication List       Accurate as of 07/23/16  1:13 PM. Always use your most recent med list.          amLODipine 5 MG tablet Commonly known as:  NORVASC TAKE 1 TABLET DAILY   aspirin EC 81 MG tablet Take 81 mg by mouth every morning.   atorvastatin 80 MG tablet Commonly known as:  LIPITOR Take 1 tablet (80 mg total) by mouth daily at 6 PM.   Cholecalciferol 1000 units tablet Take 1,000 Units by mouth daily.   CREON 24000-76000 units [facility] Generic drug:  Pancrelipase (Lip-Prot-Amyl) Take 1 capsule (24,000 Units total) by mouth 3 (three) times daily with meals.   diphenhydrAMINE 50 MG tablet Commonly known as:  BENADRYL Take 1 tablet (50 mg total) by mouth once. Take one hour before study   Fish Oil 1000 MG Caps Take 1 capsule by mouth daily. Reported on 12/30/2015   fluticasone 50 MCG/ACT nasal spray Commonly known as:  FLONASE Place 2 sprays into both nostrils daily as needed (sinuses).   hydrocortisone 25 MG suppository Commonly known as:   ANUSOL-HC INSERT 1 SUPPOSITORY RECTALLY TWICE A DAY AS NEEDED FOR HEMORRHOIDS   lansoprazole 30 MG capsule Commonly known as:  PREVACID Take 1 capsule (30 mg total) by mouth daily at 12 noon.   levothyroxine 50 MCG tablet Commonly known as:  SYNTHROID, LEVOTHROID Take 1 tablet (50 mcg total) by mouth daily. Yearly physical due in Nov must see MD for refills   losartan 100 MG tablet Commonly known as:  COZAAR Take 1 tablet (100 mg total) by mouth every morning.   metoCLOPramide 10 MG tablet Commonly known as:  REGLAN Take 10 mg by mouth daily.   metoprolol tartrate 25 MG tablet Commonly known as:  LOPRESSOR Take 1 tablet (25 mg total) by mouth 2 (two) times daily.   nitroGLYCERIN 0.4 MG SL tablet Commonly known as:  NITROSTAT Place 1 tablet (0.4 mg total) under the tongue every 5 (five) minutes x 3 doses as needed for chest pain.   PARoxetine 10 MG tablet Commonly known as:  PAXIL Take 1 tablet (10 mg total) by mouth daily.   predniSONE 50 MG tablet Commonly known as:  DELTASONE Take 50mg  at 13 hours, 7 hours, and 1 hour before scan   sodium chloride 0.65 % Soln nasal spray Commonly known as:  OCEAN Place 1 spray into both nostrils as needed for congestion.   SOMATULINE DEPOT [place] Inject 120 mcg into the skin every 28 (twenty-eight) days. Receives  at Dr Antonieta Pert office   ticagrelor 90 MG Tabs tablet Commonly known as:  BRILINTA Take 1 tablet (90 mg total) by mouth 2 (two) times daily.   vitamin B-12 1000 MCG tablet Commonly known as:  CYANOCOBALAMIN Take 1,000 mcg by mouth daily.       Allergies:  Allergies  Allergen Reactions  . Iohexol Hives     Code: HIVES, Desc: PER [name] @ [facility]IMARY CARE, PT IS ALLERGIC TO CONTRAST DYE 10/02/08/RM  05/01/10...needs full premeds per our protocol w/ [facility]., Onset Date: [id number]     Past Medical History, Surgical history, Social history, and Family History were reviewed and updated.  Review of Systems: All other 10  point review of systems is negative.   Physical Exam:  vitals were not taken for this visit.  Wt Readings from Last 3 Encounters:  05/21/16 192 lb (87.1 kg)  03/19/16 198 lb (89.8 kg)  03/11/16 201 lb 4.5 oz (91.3 kg)    Ocular: Sclerae unicteric, pupils equal, round and reactive to light Ear-nose-throat: Oropharynx clear, dentition fair Lymphatic: No cervical supraclavicular or axillary adenopathy Lungs no rales or rhonchi, good excursion bilaterally Heart regular rate and rhythm, no murmur appreciated Abd soft, nontender, positive bowel sounds, no spleen or liver tip palpated on exam, no fluid wave MSK no focal spinal tenderness, no joint edema Neuro: non-focal, well-oriented, appropriate affect Breasts: Deferred  Lab Results  Component Value Date   WBC 6.6 07/23/2016   HGB 11.8 (L) 07/23/2016   HCT 36.4 (L) 07/23/2016   MCV 87 07/23/2016   PLT 161 07/23/2016   No results found for: FERRITIN, IRON, TIBC, UIBC, IRONPCTSAT Lab Results  Component Value Date   RBC 4.21 07/23/2016   No results found for: KPAFRELGTCHN, LAMBDASER, KAPLAMBRATIO No results found for: IGGSERUM, IGA, IGMSERUM No results found for: Ronnald Ramp, A1GS, A2GS, Tillman Sers, SPEI   Chemistry      Component Value Date/Time   NA 141 05/21/2016 1324   K 4.1 05/21/2016 1324   CL 100 03/19/2016 1029   CO2 29 05/21/2016 1324   BUN 16.2 05/21/2016 1324   CREATININE 0.9 05/21/2016 1324      Component Value Date/Time   CALCIUM 9.4 05/21/2016 1324   ALKPHOS 76 05/21/2016 1324   AST 28 05/21/2016 1324   ALT 15 05/21/2016 1324   BILITOT 0.99 05/21/2016 1324     Impression and Plan: Brian Mays is 70 year old gentleman with a neuroendocrine carcinoma and liver metastases. He had a third intrahepatic therapy with yttrium-90 back in November 2016. He is doing well on Somatuline. His Chromagranin A was 10 in July.  CT scans in February showed no metastatic disease of the chest and  stable hepatic and central mesenteric disease. We will repeat CT of the abdomen and Pelvis in November at his next follow-up appointment.  We will proceed with his Somatuline injection today as planned. He will continue on his monthly injection schedule.  His quality of life at this time is quite good. We will plan to see him back in 2 month for CT, repeat labs and follow-up.  He knows to contact us with any questions or concerns. We can certainly see him sooner if need be.   Eliezer Bottom, NP 9/22/20171:13 PM

## 2016-07-23 NOTE — Patient Instructions (Signed)
Lanreotide injection What is this medicine? LANREOTIDE (lan REE oh tide) is used to reduce blood levels of growth hormone in patients with a condition called acromegaly. It also works to slow or stop tumor growth in patients with gastroenteropancreatic neuroendocrine tumor (GEP-NET). This medicine may be used for other purposes; ask your health care provider or pharmacist if you have questions. What should I tell my health care provider before I take this medicine? They need to know if you have any of these conditions: -diabetes -gallbladder disease -heart disease -kidney disease -liver disease -an unusual or allergic reaction to lanreotide, other medicines, latex, foods, dyes, or preservatives -pregnant or trying to get pregnant -breast-feeding How should I use this medicine? This medicine is for injection under the skin. It is given by a health care professional in a hospital or clinic setting. Contact your pediatrician or health care professional regarding the use of this medicine in children. Special care may be needed. Overdosage: If you think you have taken too much of this medicine contact a poison control center or emergency room at once. NOTE: This medicine is only for you. Do not share this medicine with others. What if I miss a dose? It is important not to miss your dose. Call your doctor or health care professional if you are unable to keep an appointment. What may interact with this medicine? -bromocriptine -cyclosporine -medicines for diabetes, including insulin -medicines for heart disease or hypertension -quinidine This list may not describe all possible interactions. Give your health care provider a list of all the medicines, herbs, non-prescription drugs, or dietary supplements you use. Also tell them if you smoke, drink alcohol, or use illegal drugs. Some items may interact with your medicine. What should I watch for while using this medicine? Visit your doctor or  health care professional for regular checks on your progress. Your condition will be monitored carefully while you are receiving this medicine. This medicine may cause increases or decreases in blood sugar. Signs of high blood sugar include frequent urination, unusual thirst, flushed or dry skin, difficulty breathing, drowsiness, stomach ache, nausea, vomiting or dry mouth. Signs of low blood sugar include chills, cool, pale skin or cold sweats, drowsiness, extreme hunger, fast heartbeat, headache, nausea, nervousness or anxiety, shakiness, trembling, unsteadiness, tiredness, or weakness. Contact your doctor or health care professional right away if you experience any of these symptoms. What side effects may I notice from receiving this medicine? Side effects that you should report to your doctor or health care professional as soon as possible: -allergic reactions like skin rash, itching or hives, swelling of the face, lips, or tongue -changes in blood sugar -changes in heart rate -severe stomach pain Side effects that usually do not require medical attention (report to your doctor or health care professional if they continue or are bothersome): -diarrhea or constipation -gas or stomach pain -nausea, vomiting -pain, redness, swelling and irritation at site where injected This list may not describe all possible side effects. Call your doctor for medical advice about side effects. You may report side effects to FDA at 1-800-FDA-1088. Where should I keep my medicine? This drug is given in a hospital or clinic and will not be stored at home. NOTE: This sheet is a summary. It may not cover all possible information. If you have questions about this medicine, talk to your doctor, pharmacist, or health care provider.    2016, Elsevier/Gold Standard. (2013-10-17 17:43:04)

## 2016-07-26 LAB — CHROMOGRANIN A: Chromogranin A: 8 nmol/L — ABNORMAL HIGH (ref 0–5)

## 2016-07-29 ENCOUNTER — Encounter: Payer: Self-pay | Admitting: Family Medicine

## 2016-07-29 ENCOUNTER — Ambulatory Visit (INDEPENDENT_AMBULATORY_CARE_PROVIDER_SITE_OTHER): Payer: Medicare PPO | Admitting: Family Medicine

## 2016-07-29 VITALS — BP 110/72 | HR 60 | Temp 97.8°F | Ht 70.25 in | Wt 191.2 lb

## 2016-07-29 DIAGNOSIS — G8929 Other chronic pain: Secondary | ICD-10-CM

## 2016-07-29 DIAGNOSIS — K219 Gastro-esophageal reflux disease without esophagitis: Secondary | ICD-10-CM

## 2016-07-29 DIAGNOSIS — R1013 Epigastric pain: Secondary | ICD-10-CM | POA: Diagnosis not present

## 2016-07-29 NOTE — Patient Instructions (Signed)
Please be sure to keep your upcoming GI appoint- in the meantime it is ok to use tums as needed  If you have any worsening or other concerns please let me know

## 2016-07-29 NOTE — Progress Notes (Signed)
Pre visit review using our clinic review tool, if applicable. No additional management support is needed unless otherwise documented below in the visit note. 

## 2016-07-29 NOTE — Progress Notes (Signed)
[place] at [facility] [address] [place], [place] [address] [phone]  Date:  07/29/2016   Name:  Brian Mays   DOB:  [birth year]   MRN:  [id number]  PCP:  Lamar Blinks, MD    Chief Complaint: Establish Care (Pt here to est care. c/o acid reflux worse. )   History of Present Illness:  Brian Mays is a 70 y.o. very pleasant male patient who presents with the following:  Seen here in April with GERD/ chest pain.  HPI at that time  Medical history includes neuroendocrine cardinoma with mets to liver. He has generally done quite well with this however and does not have much in the way of symptoms He has noted belching and acid reflux for the last 3 days or so. It got a little better today but he was miserable the last couple of days.   Epigastric pain is present now but improved from yesterday  He did have pancreatitis in 1999 with pseudocyst and had to be on a feeding tube for 4 months.  Also history of GERD off an on since then.   He will notice an epigastric burning sensation but admits it can be worse with exertion. He has noted some increased "heartburn" with dizziness while working on some home projects over the last few weeks He saw Dr. Einar Gip about 18 months ago and had a normal stress per his report.   He has tried several things OTC for GERD over the last few days. He also tried milk of magnesia for constipation that did seem to help His gallbladder and appendix have been removed  When he was seen in April with the sx above his troponin turned out to be high high- he ended up being admitted with NSTEMI and had a PCA performed.  He did cardiac rehab and finished this program.   He now states that he is back to exercising quite a bit- walking several miles or riding his bike- and he generally does not have any chest pain at all.  However he will sometimes notice a burning feeling in his epigastrum, will need to stop  what he is doing and rest, and then belch. Belching helps him to feel better.    This has occurred a couple of times with exercise but can also happen at rest  He will use tums as needed  Drinking beer (like when he is playing golf) can make his sx worse. He has had endoscopy but this was years ago when he had pancreatitis in 1999- he feels like his GERD started when he needed a feeding tube for several months with his pancreatitis (? Pseudocyst)  He has vomited once with these sx- this occurred about 3 weeks ago His GI is Dr. Henrene Pastor; he did have a colonoscopy in February.  However otherwise they have not really discussed his GERD  He does take prevacid He sees Dr. Marin Olp for his carcinoid tumors. He is on lanreotide injections- he gets these once a month.   Wt Readings from Last 3 Encounters:  07/29/16 191 lb 3.2 oz (86.7 kg)  07/23/16 191 lb (86.6 kg)  05/21/16 192 lb (87.1 kg)   Weight has been stable.   He may have the sx above which he attributes to GERD about twice a week     Patient Active Problem List   Diagnosis Date Noted  . Unstable angina ([place]) 02/09/2016  . Warts 09/08/2015  .  Metastasis to liver ([place])   . Neuroendocrine cancer ([place])   . Neuroendocrine carcinoma metastatic to liver ([place])   . Vitamin D deficiency 03/11/2015  . Metastatic malignant neuroendocrine tumor to liver ([place]) 02/14/2015  . Sinusitis 12/08/2012  . Sinusitis acute 04/03/2012  . Hyperglycemia 04/03/2012  . Diarrhea 03/29/2012  . Eczema 09/30/2011  . B12 deficiency 04/19/2011  . Well adult exam 03/30/2011  . Keloid scar 10/13/2009  . TOBACCO USE, QUIT 10/13/2009  . Unspecified vitamin D deficiency 06/19/2009  . Neoplasm by body site 12/23/2008  . GASTROINTESTINAL XRAY, ABNORMAL 11/14/2008  . ABDOMINAL PAIN-EPIGASTRIC 11/11/2008  . DIVERTICULOSIS, COLON 11/08/2008  . ABDOMINAL PAIN 10/02/2008  . Rash and other nonspecific skin eruption 06/05/2008  . Hypothyroidism 12/13/2007  .  HEMORRHOIDS, NOS 12/13/2007  . FLATULENCE 12/13/2007  . COLONIC POLYPS, HX OF 12/13/2007  . Essential hypertension 09/15/2007  . GERD 09/15/2007  . PANCREATITIS, CHRONIC 09/15/2007  . FATIGUE 09/15/2007  . CARDIAC MURMUR 09/15/2007  . SNORING 09/15/2007    Past Medical History:  Diagnosis Date  . Anemia    in past  . Colon polyps   . Diverticulosis   . Gallstones   . GERD (gastroesophageal reflux disease)   . Heart murmur   . Hemorrhoid   . Hiatal hernia   . HTN (hypertension)   . Hypothyroidism   . Metastatic carcinoma ([place]) 2010   Dr Jonette Eva  . Pancreatitis 1998   chronic    Past Surgical History:  Procedure Laterality Date  . APPENDECTOMY  1962  . CARDIAC CATHETERIZATION N/A 02/10/2016   Procedure: Left Heart Cath and Coronary Angiography;  Surgeon: Adrian Prows, MD;  Location: [place] [facility];  Service: Cardiovascular;  Laterality: N/A;  . CARDIAC CATHETERIZATION  02/10/2016   Procedure: Coronary/Graft Atherectomy;  Surgeon: Adrian Prows, MD;  Location: [place] [facility];  Service: Cardiovascular;;  . CARDIAC CATHETERIZATION  02/10/2016   Procedure: Coronary Stent Intervention;  Surgeon: Adrian Prows, MD;  Location: [place] [facility];  Service: Cardiovascular;;  . CATARACT EXTRACTION W/ INTRAOCULAR LENS  IMPLANT, BILATERAL Bilateral   . LAPAROSCOPIC CHOLECYSTECTOMY  1999  . LIVER BIOPSY  2010  . RADIOACTIVE SEED IMPLANT  X 3   "to my liver"  . TUMOR EXCISION  01/2009   Carcinoil Resection   . TUMOR REMOVAL     from small intestine    Social History  Substance Use Topics  . Smoking status: Former Smoker    Packs/day: 1.50    Years: 35.00    Types: Cigarettes    Start date: 09/25/1959    Quit date: 12/21/1997  . Smokeless tobacco: Never Used  . Alcohol use 1.8 - 2.4 oz/week    3 - 4 Cans of beer per week     Comment: 4 beers weekly     Family History  Problem Relation Age of Onset  . Kidney disease Mother   . Hyperlipidemia Mother   . Hypertension Mother    . COPD Father   . Ulcerative colitis Daughter     Allergies  Allergen Reactions  . Iohexol Hives     Code: HIVES, Desc: PER [name] @ [facility]IMARY CARE, PT IS ALLERGIC TO CONTRAST DYE 10/02/08/RM  05/01/10...needs full premeds per our protocol w/ [facility]., Onset Date: [id number]     Medication list has been reviewed and updated.  Current Outpatient Prescriptions on File Prior to Visit  Medication Sig Dispense Refill  . amLODipine (NORVASC) 5 MG tablet TAKE 1 TABLET DAILY 30  tablet 11  . aspirin EC 81 MG tablet Take 81 mg by mouth every morning.    [name] atorvastatin (LIPITOR) 80 MG tablet Take 1 tablet (80 mg total) by mouth daily at 6 PM. (Patient taking differently: Take by mouth. Take 1/2 tablet (40 MG) by mouth daily at 6 PM.) 30 tablet 1  . Cholecalciferol 1000 UNITS tablet Take 1,000 Units by mouth daily.      [name] CREON 24000-76000 units CPEP Take 1 capsule (24,000 Units total) by mouth 3 (three) times daily with meals. 180 capsule 3  . diphenhydrAMINE (BENADRYL) 50 MG tablet Take 1 tablet (50 mg total) by mouth once. Take one hour before study 2 tablet 0  . fluticasone (FLONASE) 50 MCG/ACT nasal spray Place 2 sprays into both nostrils daily as needed (sinuses).   11  . hydrocortisone (ANUSOL-HC) 25 MG suppository INSERT 1 SUPPOSITORY RECTALLY TWICE A DAY AS NEEDED FOR HEMORRHOIDS 30 suppository 0  . Lanreotide Acetate (SOMATULINE DEPOT [place]) Inject 120 mcg into the skin every 28 (twenty-eight) days. Receives at Dr Antonieta Pert office    . lansoprazole (PREVACID) 30 MG capsule Take 1 capsule (30 mg total) by mouth daily at 12 noon. 90 capsule 2  . levothyroxine (SYNTHROID, LEVOTHROID) 50 MCG tablet Take 1 tablet (50 mcg total) by mouth daily. Yearly physical due in Nov must see MD for refills 90 tablet 0  . losartan (COZAAR) 100 MG tablet Take 1 tablet (100 mg total) by mouth every morning. 90 tablet 3  . metoCLOPramide (REGLAN) 10 MG tablet Take 10 mg by mouth daily.    . metoprolol tartrate  (LOPRESSOR) 25 MG tablet Take 1 tablet (25 mg total) by mouth 2 (two) times daily. 60 tablet 1  . nitroGLYCERIN (NITROSTAT) 0.4 MG SL tablet Place 1 tablet (0.4 mg total) under the tongue every 5 (five) minutes x 3 doses as needed for chest pain. 25 tablet 4  . Omega-3 Fatty Acids (FISH OIL) 1000 MG CAPS Take 1 capsule by mouth daily. Reported on 12/30/2015    . PARoxetine (PAXIL) 10 MG tablet Take 1 tablet (10 mg total) by mouth daily. 90 tablet 3  . predniSONE (DELTASONE) 50 MG tablet Take 50mg  at 13 hours, 7 hours, and 1 hour before scan 3 tablet 0  . sodium chloride (OCEAN) 0.65 % SOLN nasal spray Place 1 spray into both nostrils as needed for congestion.     . ticagrelor (BRILINTA) 90 MG TABS tablet Take 1 tablet (90 mg total) by mouth 2 (two) times daily. 60 tablet 0  . vitamin B-12 (CYANOCOBALAMIN) 1000 MCG tablet Take 1,000 mcg by mouth daily.     No current facility-administered medications on file prior to visit.     Review of Systems:  As per HPI- otherwise negative.   Physical Examination: Vitals:   07/29/16 0946  BP: 110/72  Pulse: 60  Temp: 97.8 F (36.6 C)   Vitals:   07/29/16 0946  Weight: 191 lb 3.2 oz (86.7 kg)  Height: 5' 10.25" (1.784 m)   Body mass index is 27.24 kg/m. Ideal Body Weight: Weight in (lb) to have BMI = 25: 175.1  GEN: WDWN, NAD, Non-toxic, A & O x 3, looks well HEENT: Atraumatic, Normocephalic. Neck supple. No masses, No LAD.  Bilateral TM wnl, oropharynx normal.  PEERL,EOMI.   Ears and Nose: No external deformity. CV: RRR, No M/G/R. No JVD. No thrill. No extra heart sounds. PULM: CTA B, no wheezes, crackles, rhonchi. No retractions. No resp. distress.  No accessory muscle use. ABD: S, NT, ND, +BS. No rebound. No HSM. EXTR: No c/c/e NEURO Normal gait.  PSYCH: Normally interactive. Conversant. Not depressed or anxious appearing.  Calm demeanor.    Assessment and Plan: Gastroesophageal reflux disease, esophagitis presence not  specified  Abdominal pain, chronic, epigastric  Here today to follow-up on epigastric pain which he attributes to GERD.  The last time he had these sx he ended up being dx with NSTEMi and had a PCI; however as he had a cath just a few months ago and can generally exercise without any CP this is more likely due to GERD this time.  Made him a GI appt within the next two weeks.  If this fails to turn up a dx may need repeat cardiac testing.  Asked pt to please seek help if these sx are changing or worsening and he agrees  Signed Lamar Blinks, MD

## 2016-07-30 ENCOUNTER — Encounter: Payer: Self-pay | Admitting: Interventional Radiology

## 2016-08-12 ENCOUNTER — Encounter: Payer: Self-pay | Admitting: Nurse Practitioner

## 2016-08-12 ENCOUNTER — Ambulatory Visit (INDEPENDENT_AMBULATORY_CARE_PROVIDER_SITE_OTHER): Payer: Medicare PPO | Admitting: Nurse Practitioner

## 2016-08-12 VITALS — BP 118/72 | HR 67 | Ht 70.0 in | Wt 194.2 lb

## 2016-08-12 DIAGNOSIS — R42 Dizziness and giddiness: Secondary | ICD-10-CM | POA: Diagnosis not present

## 2016-08-12 DIAGNOSIS — R142 Eructation: Secondary | ICD-10-CM | POA: Diagnosis not present

## 2016-08-12 NOTE — Patient Instructions (Signed)
Follow up as needed

## 2016-08-12 NOTE — Progress Notes (Signed)
     HPI: This is a 70 year old male with CAD, s/p PCI April 2017. He has neuroendocrine carcinoma and liver metastases. He is known to Dr. Henrene Pastor. He had a colonoscopy in February of this year with removal of an inflammatory polyp.   Patient referred by PCP, Dr. Silvestre Mesi, for GERD. A few months ago patient was involved in a home project where he was bending over a lot and subsequently developed problems with epigastric burning. After completion of the home project the burning subsided. She has been on a daily PPI for years for GERD. Patient currently states he does not have any problems with acid reflux, he is hesitant to even admit to epigastric burning. When exercising such as riding bike or playing basketball a he will begin to belch and subsequently become dizzy. After resting the symptoms subside and most often he can complete the activity without problems. No associated SOB. These symptoms do not occur everytime he exercises, back to patient only had 4-6 episodes over last two months   Past Medical History:  Diagnosis Date  . Anemia    in past  . Colon polyps   . Diverticulosis   . Gallstones   . GERD (gastroesophageal reflux disease)   . Heart murmur   . Hemorrhoid   . Hiatal hernia   . HTN (hypertension)   . Hypothyroidism   . Metastatic carcinoma ([place]) 2010   Dr Jonette Eva  . Pancreatitis 1998   chronic    Patient's surgical history, family medical history, social history, and allergies were all reviewed in Epic    Physical Exam: BP 118/72   Pulse 67   Ht 5\' 10"  (1.778 m)   Wt 194 lb 3.2 oz (88.1 kg)   SpO2 98%   BMI 27.86 kg/m   GENERAL: pleasant well developed white male  in NAD PSYCH: :Pleasant, cooperative, normal affect HEENT: Normocephalic, conjunctiva pink, mucous membranes moist, neck supple without masses CARDIAC:  RRR, soft murmur heard, no peripheral edema PULM: Normal respiratory effort, lungs CTA bilaterally, no wheezing ABDOMEN:  soft,  nontender, nondistended, no obvious masses, no hepatomegaly,  normal bowel sounds SKIN:  turgor, no lesions seen Musculoskeletal:  Normal  muscle tone, 5/5 strength NEURO: Alert and oriented x 3, no focal neurologic deficits   ASSESSMENT and PLAN:  28. 70 year old male old with occasional dizziness and belching with exercise. He denies chest pain, SOB or even heartburn. Symptoms resolve after resting for a few minutes and then patient can resume activities. Repeatedly denies epigastric pain or heartburn. He was seen for similar symptoms in 2014, no alarm features then and none at present. His symptoms do not seem GI in nature but dizziness is concerning, will defer further workup of episodic dizziness to PCP  2. CAD non-STEMI status post stenting of proximal to mid segment of the RCA and mid to distal segment of the RCA April 2017. t

## 2016-08-16 NOTE — Progress Notes (Signed)
Initial assessment noted. Agree that non-GI cause for complaints should be sought through his PCP. This would be quite aypical for GERD

## 2016-08-19 ENCOUNTER — Other Ambulatory Visit: Payer: Self-pay | Admitting: Oncology

## 2016-08-20 ENCOUNTER — Ambulatory Visit (HOSPITAL_BASED_OUTPATIENT_CLINIC_OR_DEPARTMENT_OTHER): Payer: Medicare PPO

## 2016-08-20 VITALS — BP 129/66 | HR 48 | Temp 97.8°F | Resp 16

## 2016-08-20 DIAGNOSIS — C7A8 Other malignant neuroendocrine tumors: Secondary | ICD-10-CM

## 2016-08-20 DIAGNOSIS — D499 Neoplasm of unspecified behavior of unspecified site: Secondary | ICD-10-CM

## 2016-08-20 DIAGNOSIS — E340 Carcinoid syndrome: Secondary | ICD-10-CM

## 2016-08-20 DIAGNOSIS — C7B8 Other secondary neuroendocrine tumors: Secondary | ICD-10-CM | POA: Diagnosis not present

## 2016-08-20 MED ORDER — LANREOTIDE ACETATE 120 MG/0.5ML ~~LOC~~ SOLN
120.0000 mg | Freq: Once | SUBCUTANEOUS | Status: AC
  Administered 2016-08-20: 120 mg via SUBCUTANEOUS

## 2016-08-20 NOTE — Patient Instructions (Signed)
Lanreotide injection What is this medicine? LANREOTIDE (lan REE oh tide) is used to reduce blood levels of growth hormone in patients with a condition called acromegaly. It also works to slow or stop tumor growth in patients with gastroenteropancreatic neuroendocrine tumor (GEP-NET). This medicine may be used for other purposes; ask your health care provider or pharmacist if you have questions. What should I tell my health care provider before I take this medicine? They need to know if you have any of these conditions: -diabetes -gallbladder disease -heart disease -kidney disease -liver disease -an unusual or allergic reaction to lanreotide, other medicines, latex, foods, dyes, or preservatives -pregnant or trying to get pregnant -breast-feeding How should I use this medicine? This medicine is for injection under the skin. It is given by a health care professional in a hospital or clinic setting. Contact your pediatrician or health care professional regarding the use of this medicine in children. Special care may be needed. Overdosage: If you think you have taken too much of this medicine contact a poison control center or emergency room at once. NOTE: This medicine is only for you. Do not share this medicine with others. What if I miss a dose? It is important not to miss your dose. Call your doctor or health care professional if you are unable to keep an appointment. What may interact with this medicine? -bromocriptine -cyclosporine -medicines for diabetes, including insulin -medicines for heart disease or hypertension -quinidine This list may not describe all possible interactions. Give your health care provider a list of all the medicines, herbs, non-prescription drugs, or dietary supplements you use. Also tell them if you smoke, drink alcohol, or use illegal drugs. Some items may interact with your medicine. What should I watch for while using this medicine? Visit your doctor or  health care professional for regular checks on your progress. Your condition will be monitored carefully while you are receiving this medicine. This medicine may cause increases or decreases in blood sugar. Signs of high blood sugar include frequent urination, unusual thirst, flushed or dry skin, difficulty breathing, drowsiness, stomach ache, nausea, vomiting or dry mouth. Signs of low blood sugar include chills, cool, pale skin or cold sweats, drowsiness, extreme hunger, fast heartbeat, headache, nausea, nervousness or anxiety, shakiness, trembling, unsteadiness, tiredness, or weakness. Contact your doctor or health care professional right away if you experience any of these symptoms. What side effects may I notice from receiving this medicine? Side effects that you should report to your doctor or health care professional as soon as possible: -allergic reactions like skin rash, itching or hives, swelling of the face, lips, or tongue -changes in blood sugar -changes in heart rate -severe stomach pain Side effects that usually do not require medical attention (report to your doctor or health care professional if they continue or are bothersome): -diarrhea or constipation -gas or stomach pain -nausea, vomiting -pain, redness, swelling and irritation at site where injected This list may not describe all possible side effects. Call your doctor for medical advice about side effects. You may report side effects to FDA at 1-800-FDA-1088. Where should I keep my medicine? This drug is given in a hospital or clinic and will not be stored at home. NOTE: This sheet is a summary. It may not cover all possible information. If you have questions about this medicine, talk to your doctor, pharmacist, or health care provider.    2016, Elsevier/Gold Standard. (2013-10-17 17:43:04)

## 2016-08-27 ENCOUNTER — Other Ambulatory Visit: Payer: Self-pay | Admitting: Family

## 2016-09-17 ENCOUNTER — Other Ambulatory Visit: Payer: Self-pay | Admitting: Internal Medicine

## 2016-09-21 ENCOUNTER — Encounter: Payer: Self-pay | Admitting: Family Medicine

## 2016-09-21 DIAGNOSIS — I2510 Atherosclerotic heart disease of native coronary artery without angina pectoris: Secondary | ICD-10-CM | POA: Insufficient documentation

## 2016-09-28 ENCOUNTER — Other Ambulatory Visit: Payer: Self-pay | Admitting: Emergency Medicine

## 2016-09-28 MED ORDER — CREON 24000-76000 UNITS PO CPEP: 24000.0000 [IU] | ORAL_CAPSULE | Freq: Three times a day (TID) | ORAL | 0 refills | Status: DC

## 2016-09-30 ENCOUNTER — Other Ambulatory Visit: Payer: Self-pay | Admitting: *Deleted

## 2016-09-30 ENCOUNTER — Telehealth: Payer: Self-pay | Admitting: Family Medicine

## 2016-09-30 DIAGNOSIS — Z91041 Radiographic dye allergy status: Secondary | ICD-10-CM

## 2016-09-30 MED ORDER — PREDNISONE 50 MG PO TABS: ORAL_TABLET | ORAL | 2 refills | Status: DC

## 2016-09-30 MED ORDER — DIPHENHYDRAMINE HCL 50 MG PO TABS: 50.0000 mg | ORAL_TABLET | Freq: Once | ORAL | 2 refills | Status: DC

## 2016-09-30 NOTE — Telephone Encounter (Signed)
Please call oncology upstairs- I did not order this scan.  Do they have a protocol for this or we can contact radiology for their recommendation

## 2016-09-30 NOTE — Telephone Encounter (Signed)
Got a call back from [place] with oncology- they will advise the pt of the protocol that they use per [facility] radiology

## 2016-09-30 NOTE — Telephone Encounter (Signed)
Called oncology to find out the protocol for contrast allergies. Didn't get an answer. Left voicemail for nurse to return call. Pt is scheduled for appt 12/1 for CT abdomen/pelvis with contrast.

## 2016-09-30 NOTE — Telephone Encounter (Signed)
Relation to [id number] Call back number:[phone] Pharmacy: [id number] - [place], [place] [place] [phone] (Phone) [phone] (Fax)     Reason for call:  Patient has a scheduled CT tomorrow as per patient requesting benadryl and prednisone prior to scan. Please advise

## 2016-09-30 NOTE — Telephone Encounter (Signed)
Please advise. Pt is allergic to iohexol.

## 2016-10-01 ENCOUNTER — Encounter (HOSPITAL_BASED_OUTPATIENT_CLINIC_OR_DEPARTMENT_OTHER): Payer: Self-pay

## 2016-10-01 ENCOUNTER — Ambulatory Visit (HOSPITAL_BASED_OUTPATIENT_CLINIC_OR_DEPARTMENT_OTHER)
Admission: RE | Admit: 2016-10-01 | Discharge: 2016-10-01 | Disposition: A | Discharge status: Home / Self Care | Payer: Medicare PPO | Source: Ambulatory Visit | Attending: Family | Admitting: Family

## 2016-10-01 ENCOUNTER — Ambulatory Visit (HOSPITAL_BASED_OUTPATIENT_CLINIC_OR_DEPARTMENT_OTHER): Payer: Medicare PPO

## 2016-10-01 ENCOUNTER — Ambulatory Visit (HOSPITAL_BASED_OUTPATIENT_CLINIC_OR_DEPARTMENT_OTHER): Payer: Medicare PPO | Admitting: Hematology & Oncology

## 2016-10-01 ENCOUNTER — Other Ambulatory Visit (HOSPITAL_BASED_OUTPATIENT_CLINIC_OR_DEPARTMENT_OTHER): Payer: Medicare PPO

## 2016-10-01 VITALS — BP 144/72 | HR 56 | Temp 98.3°F | Resp 20 | Wt 193.1 lb

## 2016-10-01 DIAGNOSIS — C7B8 Other secondary neuroendocrine tumors: Secondary | ICD-10-CM

## 2016-10-01 DIAGNOSIS — Z79899 Other long term (current) drug therapy: Secondary | ICD-10-CM

## 2016-10-01 DIAGNOSIS — C7A8 Other malignant neuroendocrine tumors: Secondary | ICD-10-CM

## 2016-10-01 DIAGNOSIS — E032 Hypothyroidism due to medicaments and other exogenous substances: Secondary | ICD-10-CM

## 2016-10-01 DIAGNOSIS — F3289 Other specified depressive episodes: Secondary | ICD-10-CM

## 2016-10-01 DIAGNOSIS — E340 Carcinoid syndrome: Secondary | ICD-10-CM

## 2016-10-01 LAB — CMP (CANCER CENTER ONLY)
ALK PHOS: 65 U/L (ref 26–84)
ALT: 17 U/L (ref 10–47)
AST: 35 U/L (ref 11–38)
Albumin: 3.4 g/dL (ref 3.3–5.5)
BUN: 15 mg/dL (ref 7–22)
CO2: 27 mEq/L (ref 18–33)
CREATININE: 0.9 mg/dL (ref 0.6–1.2)
Calcium: 9.7 mg/dL (ref 8.0–10.3)
Chloride: 103 mEq/L (ref 98–108)
Glucose, Bld: 158 mg/dL — ABNORMAL HIGH (ref 73–118)
Potassium: 3.9 mEq/L (ref 3.3–4.7)
SODIUM: 143 meq/L (ref 128–145)
TOTAL PROTEIN: 8.4 g/dL — AB (ref 6.4–8.1)
Total Bilirubin: 0.8 mg/dl (ref 0.20–1.60)

## 2016-10-01 LAB — CBC WITH DIFFERENTIAL (CANCER CENTER ONLY)
BASO#: 0 10*3/uL (ref 0.0–0.2)
BASO%: 0.1 % (ref 0.0–2.0)
EOS ABS: 0 10*3/uL (ref 0.0–0.5)
EOS%: 0.1 % (ref 0.0–7.0)
HEMATOCRIT: 40.2 % (ref 38.7–49.9)
HEMOGLOBIN: 12.9 g/dL — AB (ref 13.0–17.1)
LYMPH#: 0.7 10*3/uL — AB (ref 0.9–3.3)
LYMPH%: 8.1 % — ABNORMAL LOW (ref 14.0–48.0)
MCH: 27.6 pg — AB (ref 28.0–33.4)
MCHC: 32.1 g/dL (ref 32.0–35.9)
MCV: 86 fL (ref 82–98)
MONO#: 0.1 10*3/uL (ref 0.1–0.9)
MONO%: 0.9 % (ref 0.0–13.0)
NEUT%: 90.8 % — ABNORMAL HIGH (ref 40.0–80.0)
NEUTROS ABS: 7.9 10*3/uL — AB (ref 1.5–6.5)
Platelets: 181 10*3/uL (ref 145–400)
RBC: 4.67 10*6/uL (ref 4.20–5.70)
RDW: 15.6 % (ref 11.1–15.7)
WBC: 8.7 10*3/uL (ref 4.0–10.0)

## 2016-10-01 LAB — TSH: TSH: 1.978 m[IU]/L (ref 0.320–4.118)

## 2016-10-01 LAB — LACTATE DEHYDROGENASE: LDH: 237 U/L (ref 125–245)

## 2016-10-01 IMAGING — CT CT ABD-PELV W/ CM
2 of 5 series · 15 of 46 positions shown, 17 images · IV contrast (APPLIED)
Comparison: [DATE]

ADDENDUM:
Upon further review, new lesion in the RIGHT hepatic lobe measures
17 mm (image 11, series 2). A potential smaller lesion in this site
on more remote scan of [DATE]. Additional subcapsular lesion the
posterior RIGHT hepatic lobe measuring 9 mm image 10, series 2.
Findings are suggestive of mild progression.

Findings conveyed toDr.  ELWAN on [DATE]  at[DATE].
CLINICAL DATA: Metastatic malignant neuroendocrine tumor to the
liver. Followup exam. Subsequent treatment strategy. Patient status
post three yttrium 90 radio embolization in [R7] and [R7]. Treatment
site included including the RIGHT, central and LEFT hepatic lobes.
More recent thermal ablation of RIGHT hepatic lobe lesion in
[DATE].
EXAM:
CT ABDOMEN AND PELVIS WITH CONTRAST
TECHNIQUE: Multidetector CT imaging of the abdomen and pelvis was performed
using the standard protocol following bolus administration of
intravenous contrast.
CONTRAST:  100mL [R7] IOPAMIDOL ([R7]) INJECTION 61%

[Series 2: axial st · axial · 0.88mm/px · z∈[-454,-24]mm · 12 of 98 slices shown, 14 images]
[im 6/98  soft-tissue]
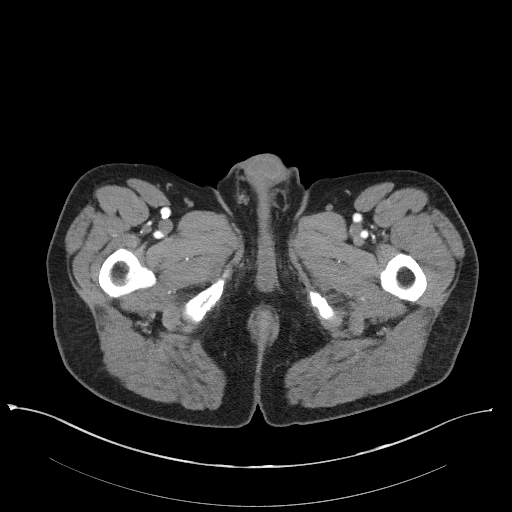
[im 6/98  bone]
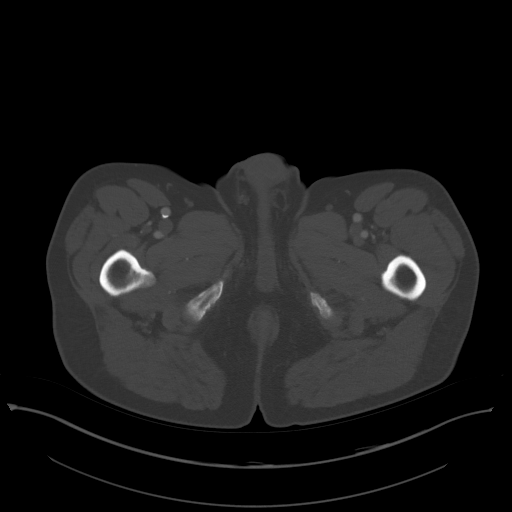
[im 18/98  soft-tissue]
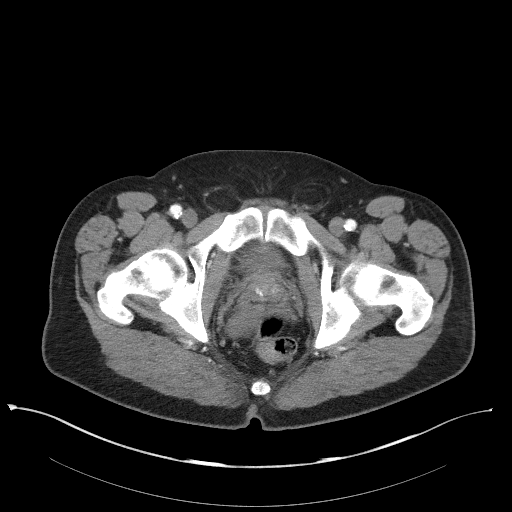
[im 23/98  soft-tissue]
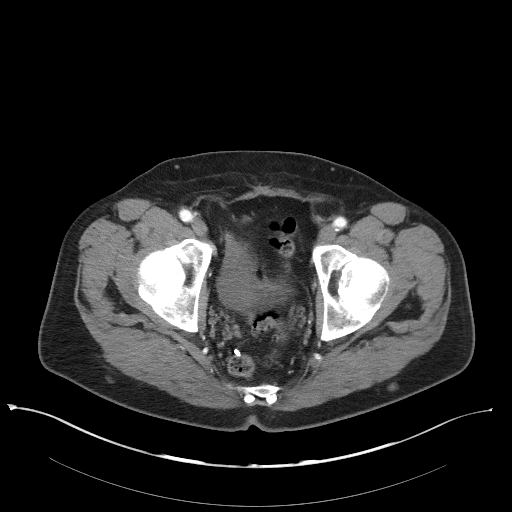
[im 29/98  soft-tissue]
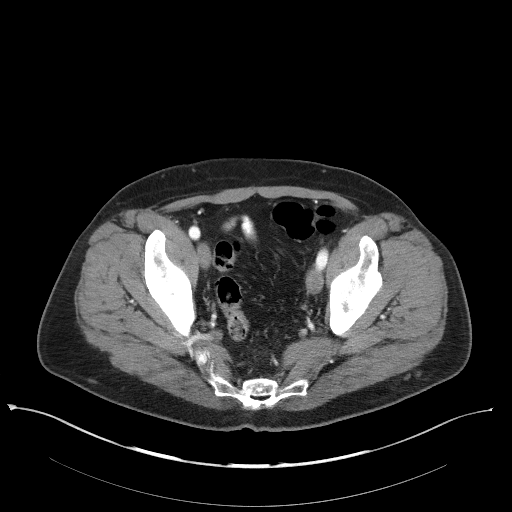
[im 40/98  soft-tissue]
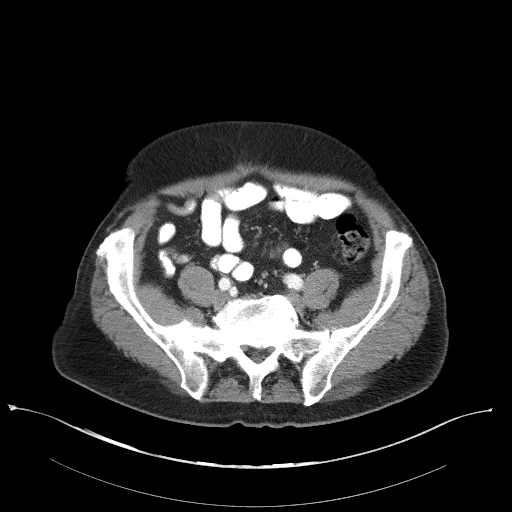
[im 46/98  soft-tissue]
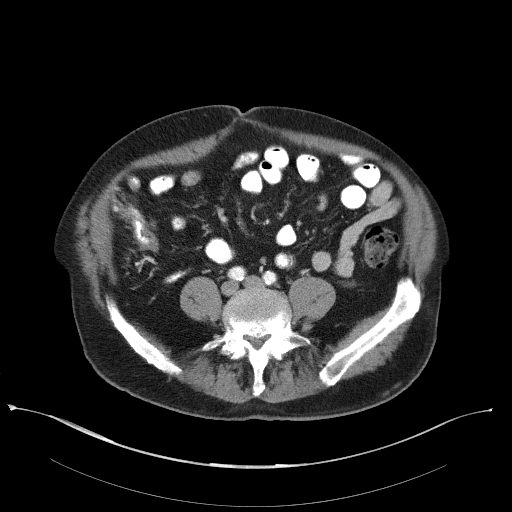
[im 52/98  soft-tissue]
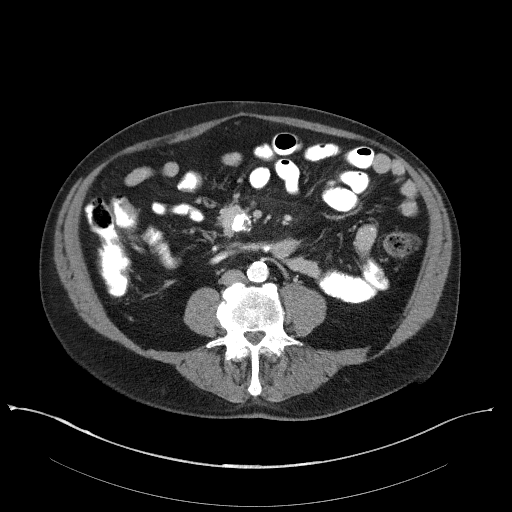
[im 63/98  soft-tissue]
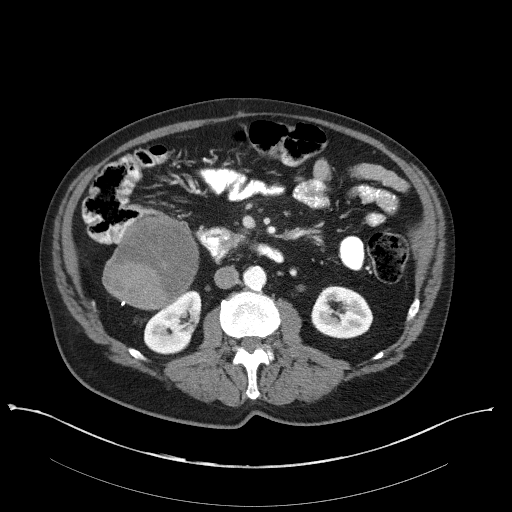
[im 69/98  soft-tissue]
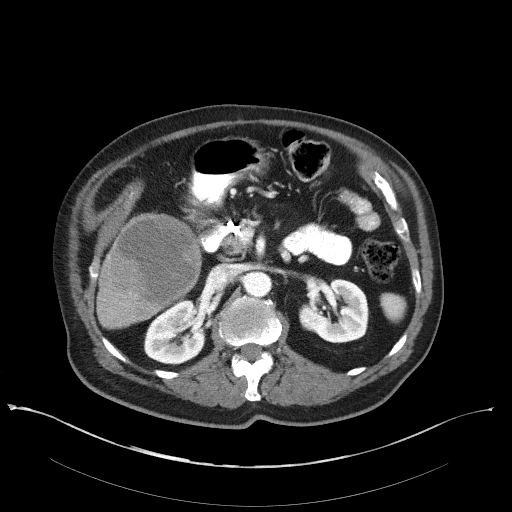
[im 69/98  bone]
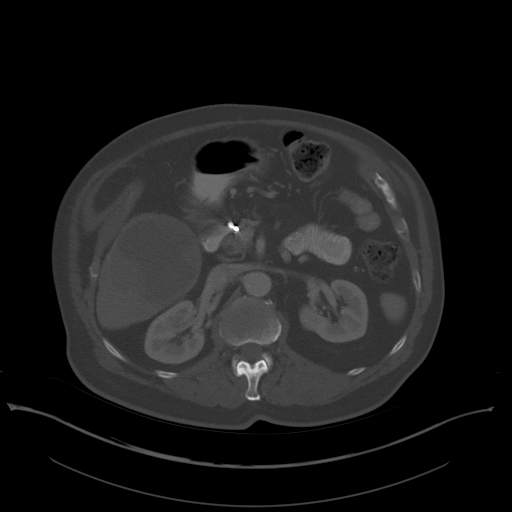
[im 75/98  soft-tissue]
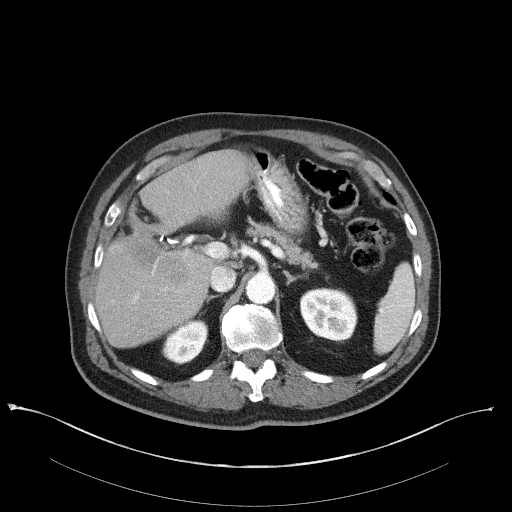
[im 86/98  soft-tissue]
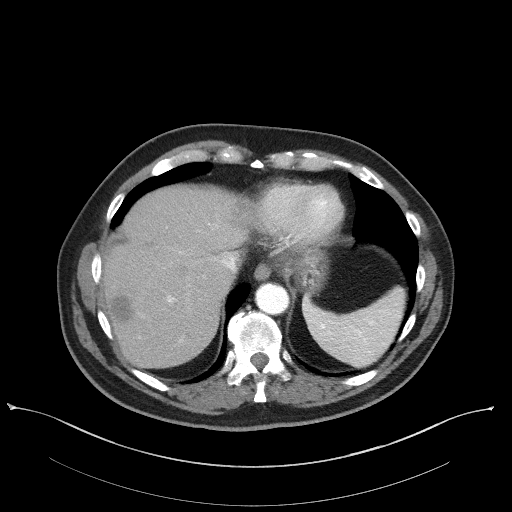
[im 92/98  soft-tissue]
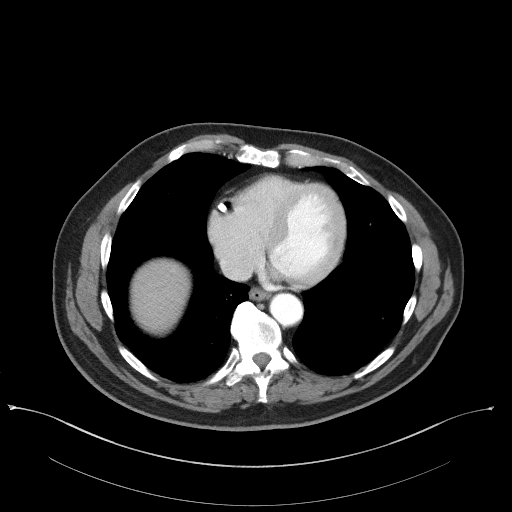

[Series 5: coronal st · coronal · 0.83mm/px · 3 of 95 slices shown]
[im 32/95  soft-tissue]
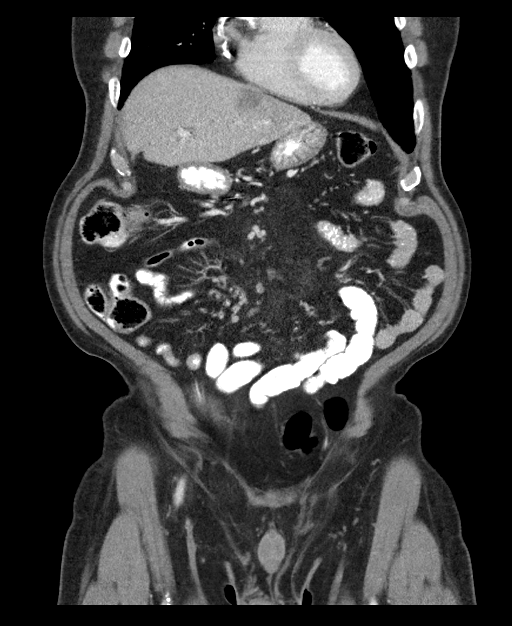
[im 42/95  soft-tissue]
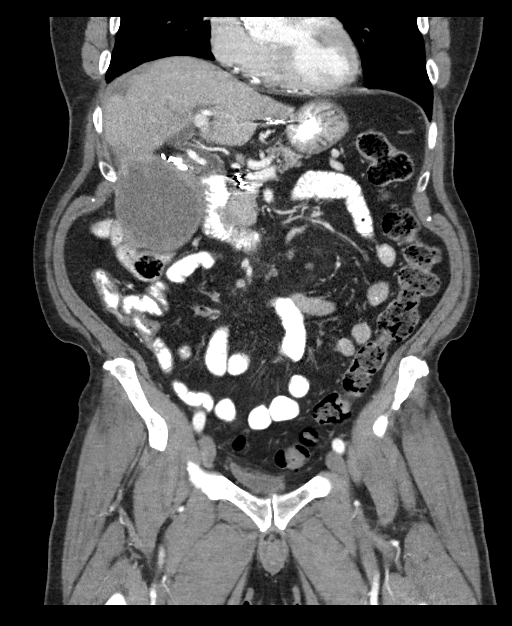
[im 53/95  soft-tissue]
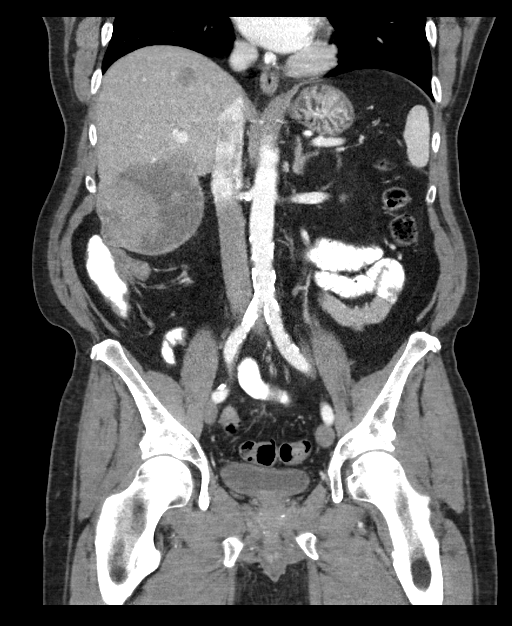

[15 of 46 positions shown; findings below may reference images not displayed]

FINDINGS: Lower chest: Lung bases are clear.

Hepatobiliary: Largest shares a large cystic and solid lesion
extending from the inferior margin of the RIGHT hepatic lobe
measures 8.0 x 5.5 cm not changed from 8.3 x 9.0 cm. Mural
nodularity within this lesion is unchanged.

More superiorly in the subcapsular RIGHT hepatic lobe low-density
lesion measures 3.0 x 2.6 cm decreased from 3.3 x 3.3 cm. This is
the presumed ablation site in the RIGHT hepatic lobe.

Within the central liver at the bifurcation of the portal veins
x 2.6 cm lesion appears slightly increased from 3.2 x 2.4 cm. On
coronal imaging lesion appears not changed.

Within the lateral LEFT hepatic lobe 3.4 x 3.3 cm lesion there is
slightly increased from 2.8 by 2.9 cm. Again on coronal imaging
lesion appears unchanged.

No new lesions are identified.

Pancreas: No focal pancreatic lesion. No duct dilatation normal
spleen appear

Spleen: Normal spleen.

Adrenals/Urinary Tract: Adrenal glands, kidneys, ureters and bladder
normal.

Stomach/Bowel: Stomach, duodenum, small-bowel and cecum normal. The
colon and rectosigmoid colon normal.

No mesenteric mass present.

Central mesenteric mass measuring 3.0 x 2.6 cm (image 47, series 2
compares to 3.0 x 2.3 cm for no significant change this lesions
partially calcified.

Vascular/Lymphatic: Abdominal aorta is normal caliber with intimal
calcification. No retroperitoneal periportal lymphadenopathy.

Reproductive: Prostate normal.

Other: No peritoneal nodularity

Musculoskeletal: Aggressive osseous lesion
IMPRESSION: 1. No clear enlargement of hepatic metastasis. Lesions in the
central liver and LEFT hepatic show equivocal enlargement. Consider
Ga 68 DOTOTATE PET-CT scan (available at [HOSPITAL]) for
evaluation of primary and metastatic neuroendocrine tumors.
2. Stable partially calcified mass within the central mesentery
3. No evidence of new sites of disease.

## 2016-10-01 MED ORDER — IOPAMIDOL (ISOVUE-300) INJECTION 61%
100.0000 mL | Freq: Once | INTRAVENOUS | Status: AC | PRN
  Administered 2016-10-01: 100 mL via INTRAVENOUS

## 2016-10-01 MED ORDER — LANREOTIDE ACETATE 120 MG/0.5ML ~~LOC~~ SOLN
120.0000 mg | Freq: Once | SUBCUTANEOUS | Status: AC
  Administered 2016-10-01: 120 mg via SUBCUTANEOUS

## 2016-10-01 MED ORDER — PAROXETINE HCL 20 MG PO TABS: 20.0000 mg | ORAL_TABLET | Freq: Every day | ORAL | 5 refills | Status: DC

## 2016-10-01 NOTE — Progress Notes (Signed)
Hematology and Oncology Follow Up Visit  KHAMANI FABRY [id number] 08-Jun-1946 70 y.o. 10/01/2016   Principle Diagnosis:  Metastatic low grade neuroendocrine tumor-hepatic metastases   Current Therapy:    S/p third yttrium-90 intrahepatic therapy-November 2016  Somatuline 120mg  sq q month     Interim History:  Mr.  Desser is back for followup.He looks good. He is doing quite well. He's had no complaints.  He is now about a year out from his last intrahepatic treatment.  We did go ahead and do another CT scan on him. This was done today. CT scan showed decrease in his liver metastasis. Nothing new was found.  His last chromogranin A level was 21. This is improved. This actually was done back in February.  His appetite is good. He's had no nausea or vomiting. He's had no abdominal pain. He's had no cough or shortness of breath.  His wife says that he is a little bit irritable. He is on Paxil. We will increase the dose to 20 mg a day.  Overall, his performance status is ECOG 1   Medications:  Current Outpatient Prescriptions:  .  amLODipine (NORVASC) 5 MG tablet, TAKE 1 TABLET DAILY, Disp: 30 tablet, Rfl: 11 .  aspirin EC 81 MG tablet, Take 81 mg by mouth every morning., Disp: , Rfl:  .  atorvastatin (LIPITOR) 80 MG tablet, Take 1 tablet (80 mg total) by mouth daily at 6 PM. (Patient taking differently: Take 40 mg by mouth. Take 1/2 tablet (40 MG) by mouth daily at 6 PM.), Disp: 30 tablet, Rfl: 1 .  Cholecalciferol 1000 UNITS tablet, Take 1,000 Units by mouth daily.  , Disp: , Rfl:  .  CREON 24000-76000 units CPEP, Take 1 capsule (24,000 Units total) by mouth 3 (three) times daily with meals., Disp: 270 capsule, Rfl: 0 .  fluticasone (FLONASE) 50 MCG/ACT nasal spray, Place 2 sprays into both nostrils daily as needed (sinuses). , Disp: , Rfl: 11 .  hydrocortisone (ANUSOL-HC) 25 MG suppository, INSERT 1 SUPPOSITORY RECTALLY TWICE A DAY AS NEEDED FOR HEMORRHOIDS, Disp: 30 suppository,  Rfl: 0 .  Lanreotide Acetate (SOMATULINE DEPOT ), Inject 120 mcg into the skin every 28 (twenty-eight) days. Receives at Dr Antonieta Pert office, Disp: , Rfl:  .  lansoprazole (PREVACID) 30 MG capsule, TAKE 1 CAPSULE (30 MG TOTAL) BY MOUTH DAILY AT 12 NOON., Disp: 90 capsule, Rfl: 0 .  levothyroxine (SYNTHROID, LEVOTHROID) 50 MCG tablet, Take 1 tablet (50 mcg total) by mouth daily. Yearly physical due in Nov must see MD for refills, Disp: 90 tablet, Rfl: 0 .  losartan (COZAAR) 100 MG tablet, Take 1 tablet (100 mg total) by mouth every morning. (Patient taking differently: Take 100 mg by mouth at bedtime. ), Disp: 90 tablet, Rfl: 3 .  metoCLOPramide (REGLAN) 10 MG tablet, Take 10 mg by mouth daily., Disp: , Rfl:  .  metoprolol tartrate (LOPRESSOR) 25 MG tablet, Take 1 tablet (25 mg total) by mouth 2 (two) times daily., Disp: 60 tablet, Rfl: 1 .  nitroGLYCERIN (NITROSTAT) 0.4 MG SL tablet, Place 1 tablet (0.4 mg total) under the tongue every 5 (five) minutes x 3 doses as needed for chest pain., Disp: 25 tablet, Rfl: 4 .  Omega-3 Fatty Acids (FISH OIL) 1000 MG CAPS, Take 1 capsule by mouth daily. Reported on 12/30/2015, Disp: , Rfl:  .  PARoxetine (PAXIL) 20 MG tablet, Take 1 tablet (20 mg total) by mouth daily., Disp: 30 tablet, Rfl: 5 .  predniSONE (DELTASONE)  50 MG tablet, Take 50mg  at 13 hours, 7 hours, and 1 hour before scan, Disp: 3 tablet, Rfl: 2 .  sodium chloride (OCEAN) 0.65 % SOLN nasal spray, Place 1 spray into both nostrils as needed for congestion. , Disp: , Rfl:  .  ticagrelor (BRILINTA) 90 MG TABS tablet, Take 1 tablet (90 mg total) by mouth 2 (two) times daily., Disp: 60 tablet, Rfl: 0 .  vitamin B-12 (CYANOCOBALAMIN) 1000 MCG tablet, Take 1,000 mcg by mouth daily., Disp: , Rfl:  No current facility-administered medications for this visit.   Facility-Administered Medications Ordered in Other Visits:  .  lanreotide acetate (SOMATULINE DEPOT) injection 120 mg, 120 mg, Subcutaneous, Once,  Volanda Napoleon, MD  Allergies:  Allergies  Allergen Reactions  . Iohexol Hives     Code: HIVES, Desc: PER [name] @ [facility], PT IS ALLERGIC TO CONTRAST DYE 10/02/08/RM  05/01/10...needs full premeds per our protocol w/ [facility] imaging., Onset Date: [id number]     Past Medical History, Surgical history, Social history, and Family History were reviewed and updated.  Review of Systems: As above  Physical Exam:  weight is 193 lb 1.9 oz (87.6 kg). His oral temperature is 98.3 F (36.8 C). His blood pressure is 144/72 (abnormal) and his pulse is 56 (abnormal). His respiration is 20.   Well-developed and well-nourished white gentleman. Head and exam shows no ocular or oral lesions. There are no palpable cervical or supraclavicular lymph nodes. Thyroid is not palpable. Lungs are clear. Cardiac exam is regular rate and rhythm with a normal S1 and S2. He has no murmurs, rubs or bruits.. Abdomen is soft. He has a well-healed laparotomy scar. There is no fluid wave. There is no guarding. He is no palpable liver or spleen tip. Back exam shows no tenderness over the spine, ribs or hips. Extremities shows no clubbing, cyanosis or edema. Skin exam no rashes. Neurological exam is nonfocal. Lab Results  Component Value Date   WBC 8.7 10/01/2016   HGB 12.9 (L) 10/01/2016   HCT 40.2 10/01/2016   MCV 86 10/01/2016   PLT 181 10/01/2016     Chemistry      Component Value Date/Time   NA 143 10/01/2016 0909   NA 141 07/23/2016 1255   K 3.9 10/01/2016 0909   K 4.1 07/23/2016 1255   CL 103 10/01/2016 0909   CO2 27 10/01/2016 0909   CO2 27 07/23/2016 1255   BUN 15 10/01/2016 0909   BUN 13.8 07/23/2016 1255   CREATININE 0.9 10/01/2016 0909   CREATININE 1.0 07/23/2016 1255      Component Value Date/Time   CALCIUM 9.7 10/01/2016 0909   CALCIUM 9.2 07/23/2016 1255   ALKPHOS 65 10/01/2016 0909   ALKPHOS 77 07/23/2016 1255   AST 35 10/01/2016 0909   AST 23 07/23/2016 1255   ALT 17 10/01/2016 0909   ALT 13  07/23/2016 1255   BILITOT 0.80 10/01/2016 0909   BILITOT 1.13 07/23/2016 1255         Impression and Plan: Mr. Nikirk is 70 year old gentleman with a neuroendocrine carcinoma. He has liver metastases. He had a 3rd intrahepatic therapy with yttrium-90 back in November 2016. This was for an enlarging right hepatic lobe lesion.  I'm glad that the CT scan shows continued benefit..  I probably would not get a scan on him for another couple months . We will see what his chromogranin A level is.  We will have him come back monthly for his injection. I  will see him back in 2 months.   Volanda Napoleon, MD 12/1/201710:51 AM

## 2016-10-01 NOTE — Patient Instructions (Signed)
Lanreotide injection What is this medicine? LANREOTIDE (lan REE oh tide) is used to reduce blood levels of growth hormone in patients with a condition called acromegaly. It also works to slow or stop tumor growth in patients with gastroenteropancreatic neuroendocrine tumor (GEP-NET). COMMON BRAND NAME(S): Somatuline [place] What should I tell my health care provider before I take this medicine? They need to know if you have any of these conditions: -diabetes -gallbladder disease -heart disease -kidney disease -liver disease -an unusual or allergic reaction to lanreotide, other medicines, latex, foods, dyes, or preservatives -pregnant or trying to get pregnant -breast-feeding How should I use this medicine? This medicine is for injection under the skin. It is given by a health care professional in a hospital or clinic setting. Contact your pediatrician or health care professional regarding the use of this medicine in children. Special care may be needed. What if I miss a dose? It is important not to miss your dose. Call your doctor or health care professional if you are unable to keep an appointment. What may interact with this medicine? -bromocriptine -cyclosporine -medicines for diabetes, including insulin -medicines for heart disease or hypertension -quinidine What should I watch for while using this medicine? Visit your doctor or health care professional for regular checks on your progress. Your condition will be monitored carefully while you are receiving this medicine. This medicine may cause increases or decreases in blood sugar. Signs of high blood sugar include frequent urination, unusual thirst, flushed or dry skin, difficulty breathing, drowsiness, stomach ache, nausea, vomiting or dry mouth. Signs of low blood sugar include chills, cool, pale skin or cold sweats, drowsiness, extreme hunger, fast heartbeat, headache, nausea, nervousness or anxiety, shakiness, trembling,  unsteadiness, tiredness, or weakness. Contact your doctor or health care professional right away if you experience any of these symptoms. What side effects may I notice from receiving this medicine? Side effects that you should report to your doctor or health care professional as soon as possible: -allergic reactions like skin rash, itching or hives, swelling of the face, lips, or tongue -changes in blood sugar -changes in heart rate -severe stomach pain Side effects that usually do not require medical attention (report to your doctor or health care professional if they continue or are bothersome): -diarrhea or constipation -gas or stomach pain -nausea, vomiting -pain, redness, swelling and irritation at site where injected Where should I keep my medicine? This drug is given in a hospital or clinic and will not be stored at home.  2017 Elsevier/Gold Standard (2013-10-17 17:43:04)

## 2016-10-04 ENCOUNTER — Telehealth: Payer: Self-pay | Admitting: *Deleted

## 2016-10-04 LAB — CHROMOGRANIN A: Chromogranin A: 5 nmol/L (ref 0–5)

## 2016-10-04 NOTE — Telephone Encounter (Addendum)
Patient aware of results  ----- Message from Volanda Napoleon, MD sent at 10/01/2016 12:25 PM EST ----- Call - thyroid level is great!!!  Brian Mays

## 2016-10-09 ENCOUNTER — Emergency Department (HOSPITAL_COMMUNITY)
Admission: EM | Admit: 2016-10-09 | Discharge: 2016-10-09 | Disposition: A | Discharge status: Home / Self Care | Payer: Medicare PPO | Attending: Emergency Medicine | Admitting: Emergency Medicine

## 2016-10-09 ENCOUNTER — Encounter (HOSPITAL_COMMUNITY): Payer: Self-pay | Admitting: Nurse Practitioner

## 2016-10-09 DIAGNOSIS — Z87891 Personal history of nicotine dependence: Secondary | ICD-10-CM | POA: Diagnosis not present

## 2016-10-09 DIAGNOSIS — Z8505 Personal history of malignant neoplasm of liver: Secondary | ICD-10-CM | POA: Diagnosis not present

## 2016-10-09 DIAGNOSIS — R112 Nausea with vomiting, unspecified: Secondary | ICD-10-CM | POA: Diagnosis not present

## 2016-10-09 DIAGNOSIS — R55 Syncope and collapse: Secondary | ICD-10-CM | POA: Diagnosis not present

## 2016-10-09 DIAGNOSIS — Z85030 Personal history of malignant carcinoid tumor of large intestine: Secondary | ICD-10-CM | POA: Insufficient documentation

## 2016-10-09 DIAGNOSIS — E039 Hypothyroidism, unspecified: Secondary | ICD-10-CM | POA: Diagnosis not present

## 2016-10-09 DIAGNOSIS — Z7901 Long term (current) use of anticoagulants: Secondary | ICD-10-CM | POA: Diagnosis not present

## 2016-10-09 DIAGNOSIS — R42 Dizziness and giddiness: Secondary | ICD-10-CM | POA: Diagnosis not present

## 2016-10-09 DIAGNOSIS — Z7982 Long term (current) use of aspirin: Secondary | ICD-10-CM | POA: Insufficient documentation

## 2016-10-09 DIAGNOSIS — I2510 Atherosclerotic heart disease of native coronary artery without angina pectoris: Secondary | ICD-10-CM | POA: Insufficient documentation

## 2016-10-09 DIAGNOSIS — I10 Essential (primary) hypertension: Secondary | ICD-10-CM | POA: Insufficient documentation

## 2016-10-09 LAB — CBC
HEMATOCRIT: 42.3 % (ref 39.0–52.0)
HEMOGLOBIN: 13.7 g/dL (ref 13.0–17.0)
MCH: 27.7 pg (ref 26.0–34.0)
MCHC: 32.4 g/dL (ref 30.0–36.0)
MCV: 85.6 fL (ref 78.0–100.0)
Platelets: 175 10*3/uL (ref 150–400)
RBC: 4.94 MIL/uL (ref 4.22–5.81)
RDW: 15.5 % (ref 11.5–15.5)
WBC: 13.1 10*3/uL — AB (ref 4.0–10.5)

## 2016-10-09 LAB — URINALYSIS, ROUTINE W REFLEX MICROSCOPIC
BACTERIA UA: NONE SEEN
GLUCOSE, UA: NEGATIVE mg/dL
HGB URINE DIPSTICK: NEGATIVE
Ketones, ur: 5 mg/dL — AB
LEUKOCYTES UA: NEGATIVE
NITRITE: NEGATIVE
PROTEIN: 100 mg/dL — AB
Specific Gravity, Urine: 1.033 — ABNORMAL HIGH (ref 1.005–1.030)
pH: 5 (ref 5.0–8.0)

## 2016-10-09 LAB — BASIC METABOLIC PANEL
ANION GAP: 8 (ref 5–15)
BUN: 11 mg/dL (ref 6–20)
CHLORIDE: 102 mmol/L (ref 101–111)
CO2: 29 mmol/L (ref 22–32)
Calcium: 8.9 mg/dL (ref 8.9–10.3)
Creatinine, Ser: 1.08 mg/dL (ref 0.61–1.24)
GFR calc Af Amer: >60 mL/min (ref >60)
Glucose, Bld: 100 mg/dL — ABNORMAL HIGH (ref 65–99)
POTASSIUM: 3.6 mmol/L (ref 3.5–5.1)
SODIUM: 139 mmol/L (ref 135–145)

## 2016-10-09 LAB — CBG MONITORING, ED: Glucose-Capillary: 89 mg/dL (ref 65–99)

## 2016-10-09 LAB — I-STAT TROPONIN, ED
TROPONIN I, POC: 0.01 ng/mL (ref 0.00–0.08)
Troponin i, poc: 0 ng/mL (ref 0.00–0.08)

## 2016-10-09 NOTE — Discharge Instructions (Signed)
Return to the ED with any concerns including fainting, chest pain, difficulty breathing, leg swelling, decreased level of alertness/lethargy, or any other alarming symptoms °

## 2016-10-09 NOTE — ED Provider Notes (Signed)
[facility] DEPT Provider Note   CSN: [id number] Arrival date & time: 10/09/16  1415     History   Chief Complaint Chief Complaint  Patient presents with  . Dizziness    HPI Brian Mays is a 70 y.o. male.  The history is provided by the patient.  Emesis   This is a new problem. Episode onset: 2 hours ago. Episode frequency: once. The problem has not changed since onset.The emesis has an appearance of stomach contents. There has been no fever. Associated symptoms include sweats. Associated symptoms comments: Shortness of breath.    Past Medical History:  Diagnosis Date  . Anemia    in past  . Colon polyps   . Diverticulosis   . Gallstones   . GERD (gastroesophageal reflux disease)   . Heart murmur   . Hemorrhoid   . Hiatal hernia   . HTN (hypertension)   . Hypothyroidism   . Metastatic carcinoma ([place]) 2010   Dr Jonette Eva  . Pancreatitis 1998   chronic    Patient Active Problem List   Diagnosis Date Noted  . CAD (coronary artery disease) 09/21/2016  . Unstable angina ([place]) 02/09/2016  . Warts 09/08/2015  . Metastasis to liver ([place])   . Neuroendocrine cancer ([place])   . Neuroendocrine carcinoma metastatic to liver ([place])   . Vitamin D deficiency 03/11/2015  . Metastatic malignant neuroendocrine tumor to liver ([place]) 02/14/2015  . Sinusitis 12/08/2012  . Sinusitis acute 04/03/2012  . Hyperglycemia 04/03/2012  . Diarrhea 03/29/2012  . Eczema 09/30/2011  . B12 deficiency 04/19/2011  . Well adult exam 03/30/2011  . Keloid scar 10/13/2009  . TOBACCO USE, QUIT 10/13/2009  . Unspecified vitamin D deficiency 06/19/2009  . Neoplasm by body site 12/23/2008  . GASTROINTESTINAL XRAY, ABNORMAL 11/14/2008  . ABDOMINAL PAIN-EPIGASTRIC 11/11/2008  . DIVERTICULOSIS, COLON 11/08/2008  . ABDOMINAL PAIN 10/02/2008  . Rash and other nonspecific skin eruption 06/05/2008  . Hypothyroidism 12/13/2007  . HEMORRHOIDS, NOS 12/13/2007  . FLATULENCE 12/13/2007  . COLONIC POLYPS,  HX OF 12/13/2007  . Essential hypertension 09/15/2007  . GERD 09/15/2007  . PANCREATITIS, CHRONIC 09/15/2007  . FATIGUE 09/15/2007  . CARDIAC MURMUR 09/15/2007  . SNORING 09/15/2007    Past Surgical History:  Procedure Laterality Date  . APPENDECTOMY  1962  . CARDIAC CATHETERIZATION N/A 02/10/2016   Procedure: Left Heart Cath and Coronary Angiography;  Surgeon: Adrian Prows, MD;  Location: [facility];  Service: Cardiovascular;  Laterality: N/A;  . CARDIAC CATHETERIZATION  02/10/2016   Procedure: Coronary/Graft Atherectomy;  Surgeon: Adrian Prows, MD;  Location: [facility];  Service: Cardiovascular;;  . CARDIAC CATHETERIZATION  02/10/2016   Procedure: Coronary Stent Intervention;  Surgeon: Adrian Prows, MD;  Location: [facility];  Service: Cardiovascular;;  . CATARACT EXTRACTION W/ INTRAOCULAR LENS  IMPLANT, BILATERAL Bilateral   . IR GENERIC HISTORICAL  12/30/2015   IR RADIOLOGIST EVAL & MGMT 12/30/2015 Aletta Edouard, MD [facility] INTERV RAD  . LAPAROSCOPIC CHOLECYSTECTOMY  1999  . LIVER BIOPSY  2010  . RADIOACTIVE SEED IMPLANT  X 3   "to my liver"  . TUMOR EXCISION  01/2009   Carcinoil Resection   . TUMOR REMOVAL     from small intestine       Home Medications    Prior to Admission medications   Medication Sig Start Date End Date Taking? Authorizing Provider  amLODipine (NORVASC) 5 MG tablet TAKE 1 TABLET DAILY 05/30/15  Yes Cassandria Anger, MD  atorvastatin (LIPITOR) 80 MG tablet Take 1 tablet (80 mg total) by mouth daily at 6 PM. Patient taking differently: Take 40 mg by mouth. Take 1/2 tablet (40 MG) by mouth daily at 6 PM. 02/11/16  Yes Adrian Prows, MD  CREON 24000-76000 units CPEP Take 1 capsule (24,000 Units total) by mouth 3 (three) times daily with meals. 09/28/16  Yes Gay Filler Copland, MD  aspirin EC 81 MG tablet Take 81 mg by mouth every morning.    Historical Provider, MD  Cholecalciferol 1000 UNITS tablet Take 1,000 Units by mouth daily.      Historical  Provider, MD  fluticasone (FLONASE) 50 MCG/ACT nasal spray Place 2 sprays into both nostrils daily as needed (sinuses).  01/06/15   Historical Provider, MD  hydrocortisone (ANUSOL-HC) 25 MG suppository INSERT 1 SUPPOSITORY RECTALLY TWICE A DAY AS NEEDED FOR HEMORRHOIDS 02/07/15   Eliezer Bottom, NP  Lanreotide Acetate (SOMATULINE DEPOT [place]) Inject 120 mcg into the skin every 28 (twenty-eight) days. Receives at Dr Antonieta Pert office    Historical Provider, MD  lansoprazole (PREVACID) 30 MG capsule TAKE 1 CAPSULE (30 MG TOTAL) BY MOUTH DAILY AT 12 NOON. 09/17/16   Evie Lacks Plotnikov, MD  levothyroxine (SYNTHROID, LEVOTHROID) 50 MCG tablet Take 1 tablet (50 mcg total) by mouth daily. Yearly physical due in Nov must see MD for refills 07/16/16   Darreld Mclean, MD  losartan (COZAAR) 100 MG tablet Take 1 tablet (100 mg total) by mouth every morning. Patient taking differently: Take 100 mg by mouth at bedtime.  03/17/16   Volanda Napoleon, MD  metoCLOPramide (REGLAN) 10 MG tablet Take 10 mg by mouth daily.    Historical Provider, MD  metoprolol tartrate (LOPRESSOR) 25 MG tablet Take 1 tablet (25 mg total) by mouth 2 (two) times daily. 02/11/16   Adrian Prows, MD  nitroGLYCERIN (NITROSTAT) 0.4 MG SL tablet Place 1 tablet (0.4 mg total) under the tongue every 5 (five) minutes x 3 doses as needed for chest pain. 02/11/16   Adrian Prows, MD  Omega-3 Fatty Acids (FISH OIL) 1000 MG CAPS Take 1 capsule by mouth daily. Reported on 12/30/2015    Historical Provider, MD  PARoxetine (PAXIL) 20 MG tablet Take 1 tablet (20 mg total) by mouth daily. 10/01/16   Volanda Napoleon, MD  predniSONE (DELTASONE) 50 MG tablet Take 50mg  at 13 hours, 7 hours, and 1 hour before scan 09/30/16   Volanda Napoleon, MD  sodium chloride (OCEAN) 0.65 % SOLN nasal spray Place 1 spray into both nostrils as needed for congestion.     Historical Provider, MD  ticagrelor (BRILINTA) 90 MG TABS tablet Take 1 tablet (90 mg total) by mouth 2 (two) times daily.  02/11/16   Adrian Prows, MD  vitamin B-12 (CYANOCOBALAMIN) 1000 MCG tablet Take 1,000 mcg by mouth daily.    Historical Provider, MD    Family History Family History  Problem Relation Age of Onset  . Kidney disease Mother   . Hyperlipidemia Mother   . Hypertension Mother   . COPD Father   . Ulcerative colitis Daughter     Social History Social History  Substance Use Topics  . Smoking status: Former Smoker    Packs/day: 1.50    Years: 35.00    Types: Cigarettes    Start date: 09/25/1959    Quit date: 12/21/1997  . Smokeless tobacco: Never Used  . Alcohol use 1.8 - 2.4 oz/week    3 - 4 Cans of beer  per week     Comment: 4 beers weekly      Allergies   Iohexol   Review of Systems Review of Systems  Constitutional: Positive for diaphoresis (during event accompanied by shortness of breath).  Gastrointestinal: Positive for vomiting.  All other systems reviewed and are negative.    Physical Exam Updated Vital Signs BP 126/68   Pulse (!) 58   Temp 97.7 F (36.5 C) (Oral)   Resp 13   Ht 5' 10.5" (1.791 m)   Wt 193 lb (87.5 kg)   SpO2 99%   BMI 27.30 kg/m   Physical Exam  Constitutional: He is oriented to person, place, and time. He appears well-developed and well-nourished. No distress.  HENT:  Head: Normocephalic and atraumatic.  Nose: Nose normal.  Eyes: Conjunctivae are normal.  Neck: Neck supple. No tracheal deviation present.  Cardiovascular: Normal rate, regular rhythm and normal heart sounds.   Pulmonary/Chest: Effort normal and breath sounds normal. No respiratory distress.  Abdominal: Soft. He exhibits no distension.  Neurological: He is alert and oriented to person, place, and time.  Skin: Skin is warm and dry.  Psychiatric: He has a normal mood and affect.  Vitals reviewed.    ED Treatments / Results  Labs (all labs ordered are listed, but only abnormal results are displayed) Labs Reviewed  BASIC METABOLIC PANEL - Abnormal; Notable for the  following:       Result Value   Glucose, Bld 100 (*)    All other components within normal limits  CBC - Abnormal; Notable for the following:    WBC 13.1 (*)    All other components within normal limits  URINALYSIS, ROUTINE W REFLEX MICROSCOPIC - Abnormal; Notable for the following:    Color, Urine AMBER (*)    APPearance HAZY (*)    Specific Gravity, Urine 1.033 (*)    Bilirubin Urine MODERATE (*)    Ketones, ur 5 (*)    Protein, ur 100 (*)    Squamous Epithelial / LPF 0-5 (*)    All other components within normal limits  CBG MONITORING, ED  I-STAT TROPOININ, ED  I-STAT TROPOININ, ED    EKG  EKG Interpretation  Date/Time:  Saturday October 09 2016 14:20:32 EST Ventricular Rate:  57 PR Interval:    QRS Duration: 85 QT Interval:  407 QTC Calculation: 397 R Axis:   -9 Text Interpretation:  Sinus rhythm Borderline prolonged PR interval Abnormal R-wave progression, early transition No significant change since last tracing Confirmed by Violia Knopf MD, Colson Barco [id number]) on 10/09/2016 2:25:29 PM       Radiology No results found.  Procedures Procedures (including critical care time)  Medications Ordered in ED Medications - No data to display   Initial Impression / Assessment and Plan / ED Course  I have reviewed the triage vital signs and the nursing notes.  Pertinent labs & imaging results that were available during my care of the patient were reviewed by me and considered in my medical decision making (see chart for details).  Clinical Course     70 year old male with history of coronary artery disease status post PCI to RCA 8 months ago for NSTEMI presents with nausea after drinking several sips of beer at the grocery store, vomiting once and becoming diaphoretic with lightheadedness intermittently over a half an hour 2 hours prior to arrival. His symptoms of since completely resolved. No chest pain with the event but felt he was short of breath. EKG unchanged from prior.  Symptoms are highly atypical for ACS and appeared to be more vagal in nature relating to the patient's nausea after the irritant of the alcoholic beverage on an empty stomach. Patient had a myocardial perfusion scan last month that was negative for inducible defect. First troponin is negative. Plan will be for serial troponin at 3 hours for further risk stratification and disposition from there after monitoring in the emergency department for recurrence. If second troponin is negative then I feel he is safe to return home and monitor for changes in his symptoms with return precautions discussed for recurrence.  Final Clinical Impressions(s) / ED Diagnoses   Final diagnoses:  Lightheadedness  Nausea and vomiting, intractability of vomiting not specified, unspecified vomiting type    New Prescriptions New Prescriptions   No medications on file     Leo Grosser, MD 10/09/16 1904

## 2016-10-09 NOTE — ED Triage Notes (Signed)
Per EMS pt shopping and had an acute episode of dizziness, diaphoresis and nausea. Patient did not have syncopal episode. Pt took one nitroglycerin without relief. Upon EMS arrival patient was diaphoretic and lips were cyanotic. Pt spo2 100%/RA and BP 154/80. Pt given 4mg  of IV zofran and 6L O2 via [place]. Pt denies dizziness presently.

## 2016-10-13 ENCOUNTER — Other Ambulatory Visit: Payer: Self-pay | Admitting: Emergency Medicine

## 2016-10-13 ENCOUNTER — Other Ambulatory Visit: Payer: Self-pay | Admitting: Family Medicine

## 2016-10-14 ENCOUNTER — Ambulatory Visit (INDEPENDENT_AMBULATORY_CARE_PROVIDER_SITE_OTHER): Payer: Medicare PPO | Admitting: Family Medicine

## 2016-10-14 ENCOUNTER — Encounter: Payer: Self-pay | Admitting: Family Medicine

## 2016-10-14 ENCOUNTER — Other Ambulatory Visit: Payer: Self-pay | Admitting: Family Medicine

## 2016-10-14 VITALS — BP 137/61 | HR 51 | Temp 97.9°F | Wt 197.2 lb

## 2016-10-14 DIAGNOSIS — D72829 Elevated white blood cell count, unspecified: Secondary | ICD-10-CM | POA: Diagnosis not present

## 2016-10-14 DIAGNOSIS — C7B8 Other secondary neuroendocrine tumors: Secondary | ICD-10-CM | POA: Diagnosis not present

## 2016-10-14 DIAGNOSIS — I10 Essential (primary) hypertension: Secondary | ICD-10-CM

## 2016-10-14 DIAGNOSIS — R112 Nausea with vomiting, unspecified: Secondary | ICD-10-CM | POA: Diagnosis not present

## 2016-10-14 MED ORDER — LOSARTAN POTASSIUM 100 MG PO TABS: 100.0000 mg | ORAL_TABLET | Freq: Every day | ORAL | 3 refills | Status: DC

## 2016-10-14 NOTE — Patient Instructions (Signed)
It was good to see you today- please let me know if you have any other concerning episodes At this time I would suggest that you avoid alcohol since it seems to make you feel sick- perhaps you can try it again in a few weeks or months Your white blood cell count was a bit high at the ER last week- this is likely due to vomiting. You will have a blood count soon per Dr. Marin Olp to recheck this.

## 2016-10-14 NOTE — Progress Notes (Signed)
Pre visit review using our clinic review tool, if applicable. No additional management support is needed unless otherwise documented below in the visit note. 

## 2016-10-14 NOTE — Progress Notes (Signed)
[place] at [facility] [address] [place], [place] [address] [phone]  Date:  10/14/2016   Name:  Brian Mays   DOB:  January 13, 1946   MRN:  [id number]  PCP:  Lamar Blinks, MD    Chief Complaint: No chief complaint on file.   History of Present Illness:  Brian Mays is a 70 y.o. very pleasant male patient who presents with the following:  History of CAD, neuroendocrine tumor with mets to liver, HTN , hypothyroidism Here today for hospital follow-up.  On 12/9 he was shopping and tried a free sample of beer . He then felt ill- felt lightheaded and threw up.  He then felt out of breath, vomited again. He felt like he might pass out so his wife took him to the ER.    His ER evaluation was benign- he had negative tropoin x2 sets.   He also did a stress test per Dr. Einar Gip on 09/17/16- this was low risk  His oncologist is [facility]. They recently did a CT scan that was reassuring He continues to notice sx of GERD- drinking a beer does tend to make this worse much of the time.   He used to be able to drink alcohol without any problems but notes that recently this seems to trigger sx.  He also feels like his balance is not quite as good as it used to be, but cannot be more specific than this.    He is otherwise feeling well- no fever.  Appetite is normal.  He notes that he gets tired more easily than in years past but thinks this is just due to normal aging .      Lab Results  Component Value Date   TSH 1.978 10/01/2016     Patient Active Problem List   Diagnosis Date Noted  . CAD (coronary artery disease) 09/21/2016  . Unstable angina ([place]) 02/09/2016  . Warts 09/08/2015  . Metastasis to liver ([place])   . Neuroendocrine cancer ([place])   . Neuroendocrine carcinoma metastatic to liver ([place])   . Vitamin D deficiency 03/11/2015  . Metastatic malignant neuroendocrine tumor to liver ([place]) 02/14/2015  . Sinusitis 12/08/2012  .  Sinusitis acute 04/03/2012  . Hyperglycemia 04/03/2012  . Diarrhea 03/29/2012  . Eczema 09/30/2011  . B12 deficiency 04/19/2011  . Well adult exam 03/30/2011  . Keloid scar 10/13/2009  . TOBACCO USE, QUIT 10/13/2009  . Unspecified vitamin D deficiency 06/19/2009  . Neoplasm by body site 12/23/2008  . GASTROINTESTINAL XRAY, ABNORMAL 11/14/2008  . ABDOMINAL PAIN-EPIGASTRIC 11/11/2008  . DIVERTICULOSIS, COLON 11/08/2008  . ABDOMINAL PAIN 10/02/2008  . Rash and other nonspecific skin eruption 06/05/2008  . Hypothyroidism 12/13/2007  . HEMORRHOIDS, NOS 12/13/2007  . FLATULENCE 12/13/2007  . COLONIC POLYPS, HX OF 12/13/2007  . Essential hypertension 09/15/2007  . GERD 09/15/2007  . PANCREATITIS, CHRONIC 09/15/2007  . FATIGUE 09/15/2007  . CARDIAC MURMUR 09/15/2007  . SNORING 09/15/2007    Past Medical History:  Diagnosis Date  . Anemia    in past  . Colon polyps   . Diverticulosis   . Gallstones   . GERD (gastroesophageal reflux disease)   . Heart murmur   . Hemorrhoid   . Hiatal hernia   . HTN (hypertension)   . Hypothyroidism   . Metastatic carcinoma ([place]) 2010   Dr Jonette Eva  . Pancreatitis 1998   chronic    Past Surgical History:  Procedure Laterality Date  .  APPENDECTOMY  1962  . CARDIAC CATHETERIZATION N/A 02/10/2016   Procedure: Left Heart Cath and Coronary Angiography;  Surgeon: Adrian Prows, MD;  Location: [facility];  Service: Cardiovascular;  Laterality: N/A;  . CARDIAC CATHETERIZATION  02/10/2016   Procedure: Coronary/Graft Atherectomy;  Surgeon: Adrian Prows, MD;  Location: [facility];  Service: Cardiovascular;;  . CARDIAC CATHETERIZATION  02/10/2016   Procedure: Coronary Stent Intervention;  Surgeon: Adrian Prows, MD;  Location: [facility];  Service: Cardiovascular;;  . CATARACT EXTRACTION W/ INTRAOCULAR LENS  IMPLANT, BILATERAL Bilateral   . IR GENERIC HISTORICAL  12/30/2015   IR RADIOLOGIST EVAL & MGMT 12/30/2015 Aletta Edouard, MD [facility]  INTERV RAD  . LAPAROSCOPIC CHOLECYSTECTOMY  1999  . LIVER BIOPSY  2010  . RADIOACTIVE SEED IMPLANT  X 3   "to my liver"  . TUMOR EXCISION  01/2009   Carcinoil Resection   . TUMOR REMOVAL     from small intestine    Social History  Substance Use Topics  . Smoking status: Former Smoker    Packs/day: 1.50    Years: 35.00    Types: Cigarettes    Start date: 09/25/1959    Quit date: 12/21/1997  . Smokeless tobacco: Never Used  . Alcohol use 1.8 - 2.4 oz/week    3 - 4 Cans of beer per week     Comment: 4 beers weekly     Family History  Problem Relation Age of Onset  . Kidney disease Mother   . Hyperlipidemia Mother   . Hypertension Mother   . COPD Father   . Ulcerative colitis Daughter     Allergies  Allergen Reactions  . Iohexol Hives     Code: HIVES, Desc: PER [name] @ PRIMARY CARE, PT IS ALLERGIC TO CONTRAST DYE 10/02/08/RM  05/01/10...needs full premeds per our protocol w/ [facility] imaging., Onset Date: [id number]     Medication list has been reviewed and updated.  Current Outpatient Prescriptions on File Prior to Visit  Medication Sig Dispense Refill  . amLODipine (NORVASC) 5 MG tablet TAKE 1 TABLET DAILY 30 tablet 11  . aspirin EC 81 MG tablet Take 81 mg by mouth every morning.    [name] atorvastatin (LIPITOR) 80 MG tablet Take 1 tablet (80 mg total) by mouth daily at 6 PM. (Patient taking differently: Take 40 mg by mouth. Take 1/2 tablet (40 MG) by mouth daily at 6 PM.) 30 tablet 1  . Cholecalciferol 1000 UNITS tablet Take 1,000 Units by mouth daily.      [name] CREON 24000-76000 units CPEP Take 1 capsule (24,000 Units total) by mouth 3 (three) times daily with meals. 270 capsule 0  . fluticasone (FLONASE) 50 MCG/ACT nasal spray Place 2 sprays into both nostrils daily as needed (sinuses).   11  . hydrocortisone (ANUSOL-HC) 25 MG suppository INSERT 1 SUPPOSITORY RECTALLY TWICE A DAY AS NEEDED FOR HEMORRHOIDS 30 suppository 0  . Lanreotide Acetate (SOMATULINE DEPOT [place]) Inject 120 mcg into  the skin every 28 (twenty-eight) days. Receives at Dr Antonieta Pert office    . lansoprazole (PREVACID) 30 MG capsule TAKE 1 CAPSULE (30 MG TOTAL) BY MOUTH DAILY AT 12 NOON. 90 capsule 0  . levothyroxine (SYNTHROID, LEVOTHROID) 50 MCG tablet Take 1 tablet (50 mcg total) by mouth daily. Yearly physical due in Nov must see MD for refills 90 tablet 0  . losartan (COZAAR) 100 MG tablet Take 1 tablet (100 mg total) by mouth every morning. (Patient taking differently: Take 100  mg by mouth at bedtime. ) 90 tablet 3  . metoCLOPramide (REGLAN) 10 MG tablet Take 10 mg by mouth daily.    . metoprolol tartrate (LOPRESSOR) 25 MG tablet Take 1 tablet (25 mg total) by mouth 2 (two) times daily. 60 tablet 1  . nitroGLYCERIN (NITROSTAT) 0.4 MG SL tablet Place 1 tablet (0.4 mg total) under the tongue every 5 (five) minutes x 3 doses as needed for chest pain. 25 tablet 4  . Omega-3 Fatty Acids (FISH OIL) 1000 MG CAPS Take 1 capsule by mouth daily. Reported on 12/30/2015    . PARoxetine (PAXIL) 20 MG tablet Take 1 tablet (20 mg total) by mouth daily. 30 tablet 5  . predniSONE (DELTASONE) 50 MG tablet Take 50mg  at 13 hours, 7 hours, and 1 hour before scan 3 tablet 2  . sodium chloride (OCEAN) 0.65 % SOLN nasal spray Place 1 spray into both nostrils as needed for congestion.     . ticagrelor (BRILINTA) 90 MG TABS tablet Take 1 tablet (90 mg total) by mouth 2 (two) times daily. 60 tablet 0  . vitamin B-12 (CYANOCOBALAMIN) 1000 MCG tablet Take 1,000 mcg by mouth daily.     No current facility-administered medications on file prior to visit.     Review of Systems:  As per HPI- otherwise negative. No fever or chills No further vomiting.   No blood in his stool appetite is good  Physical Examination: Blood pressure 137/61, pulse (!) 51, temperature 97.9 F (36.6 C), temperature source Oral, weight 197 lb 3.2 oz (89.4 kg), SpO2 100 %. Body mass index is 27.9 kg/m. Pulse Readings from Last 3 Encounters:  10/14/16 (!)  51  10/09/16 (!) 54  10/01/16 (!) 56     GEN: WDWN, NAD, Non-toxic, A & O x 3, mild overweight, looks well HEENT: Atraumatic, Normocephalic. Neck supple. No masses, No LAD.  Bilateral TM wnl, oropharynx normal.  PEERL,EOMI.   Ears and Nose: No external deformity. CV: RRR, No M/G/R. No JVD. No thrill. No extra heart sounds. PULM: CTA B, no wheezes, crackles, rhonchi. No retractions. No resp. distress. No accessory muscle use. ABD: S, NT, ND. No rebound. No HSM. EXTR: No c/c/e NEURO Normal gait.  Uses all limbs normally.  PSYCH: Normally interactive. Conversant. Not depressed or anxious appearing.  Calm demeanor.  Normal Romberg testing today  Assessment and Plan: Non-intractable vomiting with nausea, unspecified vomiting type  Essential hypertension, benign  Metastatic malignant neuroendocrine tumor to liver (HCC)  Leukocytosis, unspecified type   Here today to follow-up an incident where he felt sick after drinking part of a beer- a sample at a grocery store.  He really felt quite ill and lightheaded, vomiting a couple of times.   He went to the ER for evaluation- eval was benign Looking back he has noted some similar although not as severe sx with alcohol recently.  We are not sure of the reason for this, but suggested that he avoid alcohol at least for a few weeks BP is under good control today- continue current meds He is under the care of Dr. Marin Olp and is following up regularly He was noted to have mild leukocytosis in the ER- likely due to vomiting.  He is having labs drawn soon to monitor his malignancy so will defer repeat blood work until then   See patient instructions for more details.     Signed Lamar Blinks, MD

## 2016-10-22 ENCOUNTER — Other Ambulatory Visit: Payer: Self-pay | Admitting: Family Medicine

## 2016-10-26 ENCOUNTER — Other Ambulatory Visit (HOSPITAL_COMMUNITY): Payer: Self-pay | Admitting: Interventional Radiology

## 2016-10-26 DIAGNOSIS — C787 Secondary malignant neoplasm of liver and intrahepatic bile duct: Secondary | ICD-10-CM

## 2016-11-05 ENCOUNTER — Ambulatory Visit (HOSPITAL_BASED_OUTPATIENT_CLINIC_OR_DEPARTMENT_OTHER): Payer: Medicare HMO

## 2016-11-05 ENCOUNTER — Other Ambulatory Visit (HOSPITAL_BASED_OUTPATIENT_CLINIC_OR_DEPARTMENT_OTHER): Payer: Medicare HMO

## 2016-11-05 VITALS — BP 118/60 | HR 51 | Temp 98.1°F | Resp 16

## 2016-11-05 DIAGNOSIS — C7A8 Other malignant neuroendocrine tumors: Secondary | ICD-10-CM

## 2016-11-05 DIAGNOSIS — E032 Hypothyroidism due to medicaments and other exogenous substances: Secondary | ICD-10-CM

## 2016-11-05 DIAGNOSIS — C7B8 Other secondary neuroendocrine tumors: Secondary | ICD-10-CM | POA: Diagnosis not present

## 2016-11-05 DIAGNOSIS — E340 Carcinoid syndrome: Secondary | ICD-10-CM | POA: Diagnosis not present

## 2016-11-05 LAB — CBC WITH DIFFERENTIAL (CANCER CENTER ONLY)
BASO#: 0.1 10*3/uL (ref 0.0–0.2)
BASO%: 0.7 % (ref 0.0–2.0)
EOS ABS: 0.4 10*3/uL (ref 0.0–0.5)
EOS%: 5.5 % (ref 0.0–7.0)
HCT: 40.5 % (ref 38.7–49.9)
HEMOGLOBIN: 13.2 g/dL (ref 13.0–17.1)
LYMPH#: 1.1 10*3/uL (ref 0.9–3.3)
LYMPH%: 14.5 % (ref 14.0–48.0)
MCH: 27.8 pg — AB (ref 28.0–33.4)
MCHC: 32.6 g/dL (ref 32.0–35.9)
MCV: 85 fL (ref 82–98)
MONO#: 0.5 10*3/uL (ref 0.1–0.9)
MONO%: 6.1 % (ref 0.0–13.0)
NEUT%: 73.2 % (ref 40.0–80.0)
NEUTROS ABS: 5.4 10*3/uL (ref 1.5–6.5)
Platelets: 191 10*3/uL (ref 145–400)
RBC: 4.75 10*6/uL (ref 4.20–5.70)
RDW: 15.1 % (ref 11.1–15.7)
WBC: 7.3 10*3/uL (ref 4.0–10.0)

## 2016-11-05 LAB — CMP (CANCER CENTER ONLY)
ALBUMIN: 3.3 g/dL (ref 3.3–5.5)
ALT(SGPT): 25 U/L (ref 10–47)
AST: 35 U/L (ref 11–38)
Alkaline Phosphatase: 69 U/L (ref 26–84)
BUN, Bld: 13 mg/dL (ref 7–22)
CHLORIDE: 102 meq/L (ref 98–108)
CO2: 29 mEq/L (ref 18–33)
CREATININE: 1 mg/dL (ref 0.6–1.2)
Calcium: 9.2 mg/dL (ref 8.0–10.3)
Glucose, Bld: 198 mg/dL — ABNORMAL HIGH (ref 73–118)
POTASSIUM: 4 meq/L (ref 3.3–4.7)
Sodium: 141 mEq/L (ref 128–145)
TOTAL PROTEIN: 7.7 g/dL (ref 6.4–8.1)
Total Bilirubin: 1.1 mg/dl (ref 0.20–1.60)

## 2016-11-05 MED ORDER — LANREOTIDE ACETATE 120 MG/0.5ML ~~LOC~~ SOLN
120.0000 mg | Freq: Once | SUBCUTANEOUS | Status: AC
  Administered 2016-11-05: 120 mg via SUBCUTANEOUS

## 2016-11-05 MED ORDER — LANREOTIDE ACETATE 120 MG/0.5ML ~~LOC~~ SOLN
SUBCUTANEOUS | Status: AC
  Filled 2016-11-05: qty 120

## 2016-11-05 NOTE — Patient Instructions (Signed)
Lanreotide injection What is this medicine? LANREOTIDE (lan REE oh tide) is used to reduce blood levels of growth hormone in patients with a condition called acromegaly. It also works to slow or stop tumor growth in patients with gastroenteropancreatic neuroendocrine tumor (GEP-NET). COMMON BRAND NAME(S): Somatuline [place] What should I tell my health care provider before I take this medicine? They need to know if you have any of these conditions: -diabetes -gallbladder disease -heart disease -kidney disease -liver disease -an unusual or allergic reaction to lanreotide, other medicines, latex, foods, dyes, or preservatives -pregnant or trying to get pregnant -breast-feeding How should I use this medicine? This medicine is for injection under the skin. It is given by a health care professional in a hospital or clinic setting. Contact your pediatrician or health care professional regarding the use of this medicine in children. Special care may be needed. What if I miss a dose? It is important not to miss your dose. Call your doctor or health care professional if you are unable to keep an appointment. What may interact with this medicine? -bromocriptine -cyclosporine -medicines for diabetes, including insulin -medicines for heart disease or hypertension -quinidine What should I watch for while using this medicine? Visit your doctor or health care professional for regular checks on your progress. Your condition will be monitored carefully while you are receiving this medicine. This medicine may cause increases or decreases in blood sugar. Signs of high blood sugar include frequent urination, unusual thirst, flushed or dry skin, difficulty breathing, drowsiness, stomach ache, nausea, vomiting or dry mouth. Signs of low blood sugar include chills, cool, pale skin or cold sweats, drowsiness, extreme hunger, fast heartbeat, headache, nausea, nervousness or anxiety, shakiness, trembling,  unsteadiness, tiredness, or weakness. Contact your doctor or health care professional right away if you experience any of these symptoms. What side effects may I notice from receiving this medicine? Side effects that you should report to your doctor or health care professional as soon as possible: -allergic reactions like skin rash, itching or hives, swelling of the face, lips, or tongue -changes in blood sugar -changes in heart rate -severe stomach pain Side effects that usually do not require medical attention (report to your doctor or health care professional if they continue or are bothersome): -diarrhea or constipation -gas or stomach pain -nausea, vomiting -pain, redness, swelling and irritation at site where injected Where should I keep my medicine? This drug is given in a hospital or clinic and will not be stored at home.  2017 Elsevier/Gold Standard (2013-10-17 17:43:04)

## 2016-11-09 ENCOUNTER — Telehealth: Payer: Self-pay | Admitting: Family

## 2016-11-09 ENCOUNTER — Encounter: Payer: Self-pay | Admitting: Radiology

## 2016-11-09 ENCOUNTER — Ambulatory Visit
Admission: RE | Admit: 2016-11-09 | Discharge: 2016-11-09 | Disposition: A | Discharge status: Home / Self Care | Payer: Medicare HMO | Source: Ambulatory Visit | Attending: Interventional Radiology | Admitting: Interventional Radiology

## 2016-11-09 DIAGNOSIS — C787 Secondary malignant neoplasm of liver and intrahepatic bile duct: Secondary | ICD-10-CM

## 2016-11-09 HISTORY — PX: IR GENERIC HISTORICAL: IMG1180011

## 2016-11-09 NOTE — Progress Notes (Signed)
Chief Complaint: History of metastatic neuroendocrine carcinoma to the liver and status post prior percutaneous thermal ablation of liver metastases and Y-90 radioembolization.  Most recent procedure is thermal ablation of an enlarging lateral right lobe liver on 09/12/2015.  History of Present Illness: Since ablation in November, 2016, Mr. Purdum did have a non-ST elevation myocardial infarction resulting in right coronary artery atherectomy and DES placement by Dr. Einar Gip in April, 2017. More recently, he has had some episodes of significant dizziness with some associated nausea and vomiting. He was seen in the emergency department on 10/09/2016. Symptoms were not felt to be cardiac in etiology. He is scheduled to follow-up with Dr. Marin Olp next month.  Past Medical History:  Diagnosis Date  . Anemia    in past  . Colon polyps   . Diverticulosis   . Gallstones   . GERD (gastroesophageal reflux disease)   . Heart murmur   . Hemorrhoid   . Hiatal hernia   . HTN (hypertension)   . Hypothyroidism   . Metastatic carcinoma ([place]) 2010   Dr Jonette Eva  . Pancreatitis 1998   chronic    Past Surgical History:  Procedure Laterality Date  . APPENDECTOMY  1962  . CARDIAC CATHETERIZATION N/A 02/10/2016   Procedure: Left Heart Cath and Coronary Angiography;  Surgeon: Adrian Prows, MD;  Location: [facility];  Service: Cardiovascular;  Laterality: N/A;  . CARDIAC CATHETERIZATION  02/10/2016   Procedure: Coronary/Graft Atherectomy;  Surgeon: Adrian Prows, MD;  Location: [facility];  Service: Cardiovascular;;  . CARDIAC CATHETERIZATION  02/10/2016   Procedure: Coronary Stent Intervention;  Surgeon: Adrian Prows, MD;  Location: [facility];  Service: Cardiovascular;;  . CATARACT EXTRACTION W/ INTRAOCULAR LENS  IMPLANT, BILATERAL Bilateral   . IR GENERIC HISTORICAL  12/30/2015   IR RADIOLOGIST EVAL & MGMT 12/30/2015 Aletta Edouard, MD [facility] INTERV RAD  . LAPAROSCOPIC CHOLECYSTECTOMY   1999  . LIVER BIOPSY  2010  . RADIOACTIVE SEED IMPLANT  X 3   "to my liver"  . TUMOR EXCISION  01/2009   Carcinoil Resection   . TUMOR REMOVAL     from small intestine    Allergies: Iohexol  Medications: Prior to Admission medications   Medication Sig Start Date End Date Taking? Authorizing Provider  amLODipine (NORVASC) 5 MG tablet TAKE 1 TABLET DAILY 05/30/15   Cassandria Anger, MD  aspirin EC 81 MG tablet Take 81 mg by mouth every morning.    Historical Provider, MD  atorvastatin (LIPITOR) 80 MG tablet Take 1 tablet (80 mg total) by mouth daily at 6 PM. Patient taking differently: Take 40 mg by mouth. Take 1/2 tablet (40 MG) by mouth daily at 6 PM. 02/11/16   Adrian Prows, MD  Cholecalciferol 1000 UNITS tablet Take 1,000 Units by mouth daily.      Historical Provider, MD  CREON 24000-76000 units CPEP Take 1 capsule (24,000 Units total) by mouth 3 (three) times daily with meals. 09/28/16   Gay Filler Copland, MD  fluticasone (FLONASE) 50 MCG/ACT nasal spray Place 2 sprays into both nostrils daily as needed (sinuses).  01/06/15   Historical Provider, MD  hydrocortisone (ANUSOL-HC) 25 MG suppository INSERT 1 SUPPOSITORY RECTALLY TWICE A DAY AS NEEDED FOR HEMORRHOIDS 02/07/15   Eliezer Bottom, NP  Lanreotide Acetate (SOMATULINE DEPOT [place]) Inject 120 mcg into the skin every 28 (twenty-eight) days. Receives at Dr Antonieta Pert office    Historical Provider, MD  lansoprazole (PREVACID) 30 MG capsule  TAKE 1 CAPSULE (30 MG TOTAL) BY MOUTH DAILY AT 12 NOON. 09/17/16   Evie Lacks Plotnikov, MD  levothyroxine (SYNTHROID, LEVOTHROID) 50 MCG tablet TAKE 1 TABLET (50 MCG TOTAL) BY MOUTH DAILY. YEARLY PHYSICAL DUE IN NOV MUST SEE MD FOR REFILLS 10/22/16   Gay Filler Copland, MD  losartan (COZAAR) 100 MG tablet Take 1 tablet (100 mg total) by mouth at bedtime. 10/14/16   Gay Filler Copland, MD  metoCLOPramide (REGLAN) 10 MG tablet Take 10 mg by mouth daily.    Historical Provider, MD  metoprolol tartrate  (LOPRESSOR) 25 MG tablet Take 1 tablet (25 mg total) by mouth 2 (two) times daily. 02/11/16   Adrian Prows, MD  nitroGLYCERIN (NITROSTAT) 0.4 MG SL tablet Place 1 tablet (0.4 mg total) under the tongue every 5 (five) minutes x 3 doses as needed for chest pain. 02/11/16   Adrian Prows, MD  Omega-3 Fatty Acids (FISH OIL) 1000 MG CAPS Take 1 capsule by mouth daily. Reported on 12/30/2015    Historical Provider, MD  PARoxetine (PAXIL) 20 MG tablet Take 1 tablet (20 mg total) by mouth daily. 10/01/16   Volanda Napoleon, MD  predniSONE (DELTASONE) 50 MG tablet Take 78m at 13 hours, 7 hours, and 1 hour before scan 09/30/16   PVolanda Napoleon MD  sodium chloride (OCEAN) 0.65 % SOLN nasal spray Place 1 spray into both nostrils as needed for congestion.     Historical Provider, MD  ticagrelor (BRILINTA) 90 MG TABS tablet Take 1 tablet (90 mg total) by mouth 2 (two) times daily. 02/11/16   JAdrian Prows MD  vitamin B-12 (CYANOCOBALAMIN) 1000 MCG tablet Take 1,000 mcg by mouth daily.    Historical Provider, MD     Family History  Problem Relation Age of Onset  . Kidney disease Mother   . Hyperlipidemia Mother   . Hypertension Mother   . COPD Father   . Ulcerative colitis Daughter     Social History   Social History  . Marital status: Married    Spouse name: N/A  . Number of children: 1  . Years of education: N/A   Occupational History  . [place] [employer]  . Smoking status: Former Smoker    Packs/day: 1.50    Years: 35.00    Types: Cigarettes    Start date: 09/25/1959    Quit date: 12/21/1997  . Smokeless tobacco: Never Used  . Alcohol use 1.8 - 2.4 oz/week    3 - 4 Cans of beer per week     Comment: 4 beers weekly   . Drug use: No  . Sexual activity: Yes   Other Topics Concern  . Not on file   Social History Narrative   Regular Exercise -  NO    ECOG Status: 1 - Symptomatic but completely ambulatory  Review of Systems: A 12 point ROS discussed and  pertinent positives are indicated in the HPI above.  All other systems are negative.  Review of Systems  Constitutional: Negative.   Respiratory: Negative.   Cardiovascular: Negative.   Gastrointestinal: Negative.   Endocrine: Negative.   Genitourinary: Negative.   Musculoskeletal: Negative.   Neurological: Positive for dizziness.    Vital Signs: BP 112/62 (BP Location: Right Arm, Patient Position: Sitting, Cuff Size: Normal)   Pulse (!) 51   Temp 98.1 F (36.7 C) (Oral)   Resp 14   Ht 5' 10.5" (1.791 m)   Wt 190 lb (86.2  kg)   SpO2 99%   BMI 26.88 kg/m   Physical Exam  Constitutional: He is oriented to person, place, and time. He appears well-developed and well-nourished. No distress.  Abdominal: Soft. He exhibits no distension. There is no tenderness.  Neurological: He is alert and oriented to person, place, and time.  Skin: He is not diaphoretic.  Nursing note and vitals reviewed.   Imaging: No results found.  Labs:  CBC:  Recent Labs  07/23/16 1255 10/01/16 0909 10/09/16 1427 11/05/16 0850  WBC 6.6 8.7 13.1* 7.3  HGB 11.8* 12.9* 13.7 13.2  HCT 36.4* 40.2 42.3 40.5  PLT 161 181 175 191    COAGS:  Recent Labs  02/09/16 1915  INR 1.06  APTT 28    BMP:  Recent Labs  02/09/16 1842 02/11/16 0323 03/19/16 1029  07/23/16 1255 10/01/16 0909 10/09/16 1427 11/05/16 0850  NA 139 139 137  < > 141 143 139 141  K 5.0 4.4 4.0  < > 4.1 3.9 3.6 4.0  CL 101 104 100  --   --  103 102 102  CO2 '28 25 29  ' < > '27 27 29 29  ' GLUCOSE 94 138* 87  < > 100 158* 100* 198*  BUN 23* 12 18  < > 13.'8 15 11 13  ' CALCIUM 9.5 8.8* 9.4  < > 9.2 9.7 8.9 9.2  CREATININE 0.95 0.87 1.1  < > 1.0 0.9 1.08 1.0  GFRNONAA >60 >60  --   --   --   --  >60  --   GFRAA >60 >60  --   --   --   --  >60  --   < > = values in this interval not displayed.  LIVER FUNCTION TESTS:  Recent Labs  05/21/16 1324 07/23/16 1255 10/01/16 0909 11/05/16 0850  BILITOT 0.99 1.13 0.80 1.10    AST 28 23 35 35  ALT '15 13 17 25  ' ALKPHOS 76 77 65 69  PROT 7.6 7.4 8.4* 7.7  ALBUMIN 3.4* 3.3* 3.4 3.3    TUMOR MARKERS:  Recent Labs  11/17/15 1321 12/19/15 1008  CHROMGRNA 41* 21*    Assessment and Plan:  I met with Mr. Snelson and reviewed recent restaging CT imaging on 10/01/2016. This shows potentially minimal enlargement of some pre-existing left and right lobe liver lesions. There appear to be some potential small new lesions in the upper right lobe of the liver. Chromogranin A level is not elevated, however, with latest chromogranin level of 5 on 10/01/2016. Mr. Linarez remains on lanreotide injections every 28 days. He is due to follow-up with Dr. Marin Olp next month. I did not recommend any immediate liver intervention at this time. If there is progression of new disease in the right lobe of the liver, he may be a candidate for repeat radioembolization. I will check with Dr. Leonia Reeves to determine his prior total liver radiation dose.  Electronically SignedAletta Edouard T 11/09/2016, 9:37 AM   I spent a total of 25 Minutes in face to face in clinical consultation, greater than 50% of which was counseling/coordinating care post ablation and radioembolization of neuroendocrine liver metastases.

## 2016-11-09 NOTE — Telephone Encounter (Signed)
Spoke with Mr. Rostami and went over his PET scan results. He had just spoken with [place] and was aware of the results. All questions were answered and we will plan to see him ay his next follow-up with dr. Marin Olp on 2/2.

## 2016-11-15 ENCOUNTER — Other Ambulatory Visit: Payer: Self-pay | Admitting: Internal Medicine

## 2016-11-18 ENCOUNTER — Other Ambulatory Visit: Payer: Self-pay | Admitting: Internal Medicine

## 2016-11-19 MED ORDER — LANSOPRAZOLE 30 MG PO CPDR: 30.0000 mg | DELAYED_RELEASE_CAPSULE | Freq: Every day | ORAL | 3 refills | Status: DC

## 2016-11-30 ENCOUNTER — Other Ambulatory Visit: Payer: Self-pay | Admitting: Family Medicine

## 2016-12-01 ENCOUNTER — Other Ambulatory Visit: Payer: Self-pay | Admitting: Emergency Medicine

## 2016-12-01 MED ORDER — LEVOTHYROXINE SODIUM 50 MCG PO TABS: 50.0000 ug | ORAL_TABLET | Freq: Every day | ORAL | 2 refills | Status: DC

## 2016-12-03 ENCOUNTER — Ambulatory Visit (HOSPITAL_BASED_OUTPATIENT_CLINIC_OR_DEPARTMENT_OTHER): Payer: Medicare HMO | Admitting: Family

## 2016-12-03 ENCOUNTER — Other Ambulatory Visit (HOSPITAL_BASED_OUTPATIENT_CLINIC_OR_DEPARTMENT_OTHER): Payer: Medicare HMO

## 2016-12-03 ENCOUNTER — Ambulatory Visit (HOSPITAL_BASED_OUTPATIENT_CLINIC_OR_DEPARTMENT_OTHER): Payer: Medicare HMO

## 2016-12-03 VITALS — BP 127/61 | HR 77 | Temp 98.0°F | Wt 193.0 lb

## 2016-12-03 DIAGNOSIS — C7B8 Other secondary neuroendocrine tumors: Secondary | ICD-10-CM

## 2016-12-03 DIAGNOSIS — E340 Carcinoid syndrome: Secondary | ICD-10-CM | POA: Diagnosis not present

## 2016-12-03 DIAGNOSIS — E032 Hypothyroidism due to medicaments and other exogenous substances: Secondary | ICD-10-CM | POA: Diagnosis not present

## 2016-12-03 DIAGNOSIS — C7A8 Other malignant neuroendocrine tumors: Secondary | ICD-10-CM

## 2016-12-03 LAB — CBC WITH DIFFERENTIAL (CANCER CENTER ONLY)
BASO#: 0 10*3/uL (ref 0.0–0.2)
BASO%: 0.5 % (ref 0.0–2.0)
EOS%: 5.4 % (ref 0.0–7.0)
Eosinophils Absolute: 0.3 10*3/uL (ref 0.0–0.5)
HEMATOCRIT: 41 % (ref 38.7–49.9)
HGB: 13 g/dL (ref 13.0–17.1)
LYMPH#: 0.9 10*3/uL (ref 0.9–3.3)
LYMPH%: 14.8 % (ref 14.0–48.0)
MCH: 27.6 pg — ABNORMAL LOW (ref 28.0–33.4)
MCHC: 31.7 g/dL — ABNORMAL LOW (ref 32.0–35.9)
MCV: 87 fL (ref 82–98)
MONO#: 0.3 10*3/uL (ref 0.1–0.9)
MONO%: 5.2 % (ref 0.0–13.0)
NEUT#: 4.7 10*3/uL (ref 1.5–6.5)
NEUT%: 74.1 % (ref 40.0–80.0)
Platelets: 180 10*3/uL (ref 145–400)
RBC: 4.71 10*6/uL (ref 4.20–5.70)
RDW: 15.2 % (ref 11.1–15.7)
WBC: 6.4 10*3/uL (ref 4.0–10.0)

## 2016-12-03 LAB — CMP (CANCER CENTER ONLY)
ALT: 23 U/L (ref 10–47)
AST: 36 U/L (ref 11–38)
Albumin: 3.4 g/dL (ref 3.3–5.5)
Alkaline Phosphatase: 76 U/L (ref 26–84)
BUN, Bld: 12 mg/dL (ref 7–22)
CO2: 32 mEq/L (ref 18–33)
Calcium: 9.8 mg/dL (ref 8.0–10.3)
Chloride: 102 mEq/L (ref 98–108)
Creat: 0.8 mg/dl (ref 0.6–1.2)
Glucose, Bld: 128 mg/dL — ABNORMAL HIGH (ref 73–118)
Potassium: 3.8 mEq/L (ref 3.3–4.7)
SODIUM: 147 meq/L — AB (ref 128–145)
Total Bilirubin: 1.2 mg/dl (ref 0.20–1.60)
Total Protein: 8.2 g/dL — ABNORMAL HIGH (ref 6.4–8.1)

## 2016-12-03 MED ORDER — LANREOTIDE ACETATE 120 MG/0.5ML ~~LOC~~ SOLN
120.0000 mg | Freq: Once | SUBCUTANEOUS | Status: AC
  Administered 2016-12-03: 120 mg via SUBCUTANEOUS

## 2016-12-03 MED ORDER — LANREOTIDE ACETATE 120 MG/0.5ML ~~LOC~~ SOLN
SUBCUTANEOUS | Status: AC
  Filled 2016-12-03: qty 120

## 2016-12-03 NOTE — Patient Instructions (Signed)
Laronidase injection What is this medicine? LARONIDASE (la RON i daz) is a drug that is used to replace an enzyme that is missing in patients with some forms of mucopolysaccharidosis I (MPS I). It is used to treat some of the symptoms of the disease. This medicine is not a cure. This medicine may be used for other purposes; ask your health care provider or pharmacist if you have questions. COMMON BRAND NAME(S): Aldurazyme What should I tell my health care provider before I take this medicine? They need to know if you have any of these conditions: -an unusual or allergic reaction to laronidase, other medicines, foods, dyes, or preservatives -pregnant or trying to get pregnant -breast-feeding How should I use this medicine? This medicine is for infusion into a vein. It is given by a health care professional in a hospital or clinic setting. Talk to your pediatrician regarding the use of this medicine in children. While this drug may be prescribed for children as young as 80 months old for selected conditions, precautions do apply. Overdosage: If you think you have taken too much of this medicine contact a poison control center or emergency room at once. NOTE: This medicine is only for you. Do not share this medicine with others. What if I miss a dose? It is important not to miss your dose. Call your doctor or health care professional if you are unable to keep an appointment. What may interact with this medicine? Interactions are not expected. This list may not describe all possible interactions. Give your health care provider a list of all the medicines, herbs, non-prescription drugs, or dietary supplements you use. Also tell them if you smoke, drink alcohol, or use illegal drugs. Some items may interact with your medicine. What should I watch for while using this medicine? Your condition will be monitored carefully while you are receiving this medicine. Visit your doctor for regular checks on your  progress. Tell your doctor or healthcare professional if your symptoms do not start to get better or if they get worse. There is a registry for MPS I patients to better understand the disease and the treatments. Talk to your doctor about signing up with the registry. What side effects may I notice from receiving this medicine? Side effects that you should report to your doctor or health care professional as soon as possible: -allergic reactions like skin rash, itching or hives, swelling of the face, lips, or tongue -breathing problems -changes in vision -chest pain -cough -fever -swelling of feet or hands -flushing -low blood pressure -pain, tingling, numbness in the hands or feet -redness, pain, or swelling at site where injected -yellowing of the eyes or skin Side effects that usually do not require medical attention (report to your doctor or health care professional if they continue or are bothersome): -irritation at site where injected This list may not describe all possible side effects. Call your doctor for medical advice about side effects. You may report side effects to FDA at 1-800-FDA-1088. Where should I keep my medicine? This drug is given in a hospital or clinic and will not be stored at home. NOTE: This sheet is a summary. It may not cover all possible information. If you have questions about this medicine, talk to your doctor, pharmacist, or health care provider.  2017 Elsevier/Gold Standard (2011-01-29 11:01:14)

## 2016-12-03 NOTE — Progress Notes (Signed)
Hematology and Oncology Follow Up Visit  Brian Mays [id number] April 28, 1946 71 y.o. 12/03/2016   Principle Diagnosis:  Metastatic low grade neuroendocrine tumor-hepatic metastases  Current Therapy:   S/p third yttrium-90 intrahepatic therapy - November 2016 Somatuline 120 mg monthly    Interim History: Brian Mays is here today for a follow-up. He continues to do well but does have some intermittent fatigue. He is still going to the gym and walking for exercise.  He has had no problem with infections. His diarrhea has improved.  His CT scan last month showed stable hepatic metastasis and no evidence of new disease.  He has maintained a good appetite and is staying well hydrated. His weight is stable.  No fever, chills, n/v, cough, rash, dizziness, SOB, chest pain, palpitations, abdominal pain or changes in bowel or bladder habits.  No lymphadenopathy found on exam. No episodes of bleeding or bruising.  No swelling, tenderness, numbness or tingling in his extremities. No c/o joint aches and "bone" pain.    Medications:  Allergies as of 12/03/2016      Reactions   Iohexol Hives    Code: HIVES, Desc: PER [name] @ PRIMARY CARE, PT IS ALLERGIC TO CONTRAST DYE 10/02/08/RM  05/01/10...needs full premeds per our protocol w/ [facility]., Onset Date: [id number]      Medication List       Accurate as of 12/03/16 11:14 AM. Always use your most recent med list.          amLODipine 5 MG tablet Commonly known as:  NORVASC TAKE 1 TABLET DAILY   aspirin EC 81 MG tablet Take 81 mg by mouth every morning.   atorvastatin 80 MG tablet Commonly known as:  LIPITOR Take 1 tablet (80 mg total) by mouth daily at 6 PM.   Cholecalciferol 1000 units tablet Take 1,000 Units by mouth daily.   CREON 24000-76000 units Cpep Generic drug:  Pancrelipase (Lip-Prot-Amyl) Take 1 capsule (24,000 Units total) by mouth 3 (three) times daily with meals.   Fish Oil 1000 MG Caps Take 1 capsule by mouth daily.  Reported on 12/30/2015   fluticasone 50 MCG/ACT nasal spray Commonly known as:  FLONASE Place 2 sprays into both nostrils daily as needed (sinuses).   hydrocortisone 25 MG suppository Commonly known as:  ANUSOL-HC INSERT 1 SUPPOSITORY RECTALLY TWICE A DAY AS NEEDED FOR HEMORRHOIDS   lansoprazole 30 MG capsule Commonly known as:  PREVACID Take 1 capsule (30 mg total) by mouth daily at 12 noon.   levothyroxine 50 MCG tablet Commonly known as:  SYNTHROID, LEVOTHROID Take 1 tablet (50 mcg total) by mouth daily.   losartan 100 MG tablet Commonly known as:  COZAAR Take 1 tablet (100 mg total) by mouth at bedtime.   metoCLOPramide 10 MG tablet Commonly known as:  REGLAN Take 10 mg by mouth daily.   metoprolol tartrate 25 MG tablet Commonly known as:  LOPRESSOR Take 1 tablet (25 mg total) by mouth 2 (two) times daily.   nitroGLYCERIN 0.4 MG SL tablet Commonly known as:  NITROSTAT Place 1 tablet (0.4 mg total) under the tongue every 5 (five) minutes x 3 doses as needed for chest pain.   PARoxetine 20 MG tablet Commonly known as:  PAXIL Take 1 tablet (20 mg total) by mouth daily.   predniSONE 50 MG tablet Commonly known as:  DELTASONE Take 50mg  at 13 hours, 7 hours, and 1 hour before scan   sodium chloride 0.65 % Soln nasal spray Commonly known as:  OCEAN Place 1 spray into both nostrils as needed for congestion.   SOMATULINE DEPOT [place] Inject 120 mcg into the skin every 28 (twenty-eight) days. Receives at Dr Antonieta Pert office   ticagrelor 90 MG Tabs tablet Commonly known as:  BRILINTA Take 1 tablet (90 mg total) by mouth 2 (two) times daily.   vitamin B-12 1000 MCG tablet Commonly known as:  CYANOCOBALAMIN Take 1,000 mcg by mouth daily.       Allergies:  Allergies  Allergen Reactions  . Iohexol Hives     Code: HIVES, Desc: PER [name] @ [facility]IMARY [facility], PT IS ALLERGIC TO CONTRAST DYE 10/02/08/RM  05/01/10...needs full premeds per our protocol w/ [facility]., Onset Date:  [id number]     Past Medical History, Surgical history, Social history, and Family History were reviewed and updated.  Review of Systems: All other 10 point review of systems is negative.   Physical Exam:  weight is 193 lb (87.5 kg). His oral temperature is 98 F (36.7 C). His blood pressure is 127/61 and his pulse is 77.   Wt Readings from Last 3 Encounters:  12/03/16 193 lb (87.5 kg)  11/09/16 190 lb (86.2 kg)  10/14/16 197 lb 3.2 oz (89.4 kg)    Ocular: Sclerae unicteric, pupils equal, round and reactive to light Ear-nose-throat: Oropharynx clear, dentition fair Lymphatic: No cervical supraclavicular or axillary adenopathy Lungs no rales or rhonchi, good excursion bilaterally Heart regular rate and rhythm, no murmur appreciated Abd soft, nontender, positive bowel sounds, no spleen or liver tip palpated on exam, no fluid wave MSK no focal spinal tenderness, no joint edema Neuro: non-focal, well-oriented, appropriate affect Breasts: Deferred  Lab Results  Component Value Date   WBC 6.4 12/03/2016   HGB 13.0 12/03/2016   HCT 41.0 12/03/2016   MCV 87 12/03/2016   PLT 180 12/03/2016   No results found for: FERRITIN, IRON, TIBC, UIBC, IRONPCTSAT Lab Results  Component Value Date   RBC 4.71 12/03/2016   No results found for: KPAFRELGTCHN, LAMBDASER, KAPLAMBRATIO No results found for: IGGSERUM, IGA, IGMSERUM No results found for: Kathrynn Ducking, MSPIKE, SPEI   Chemistry      Component Value Date/Time   NA 147 (H) 12/03/2016 1032   NA 141 07/23/2016 1255   K 3.8 12/03/2016 1032   K 4.1 07/23/2016 1255   CL 102 12/03/2016 1032   CO2 32 12/03/2016 1032   CO2 27 07/23/2016 1255   BUN 12 12/03/2016 1032   BUN 13.8 07/23/2016 1255   CREATININE 0.8 12/03/2016 1032   CREATININE 1.0 07/23/2016 1255      Component Value Date/Time   CALCIUM 9.8 12/03/2016 1032   CALCIUM 9.2 07/23/2016 1255   ALKPHOS 76 12/03/2016 1032   ALKPHOS  77 07/23/2016 1255   AST 36 12/03/2016 1032   AST 23 07/23/2016 1255   ALT 23 12/03/2016 1032   ALT 13 07/23/2016 1255   BILITOT 1.20 12/03/2016 1032   BILITOT 1.13 07/23/2016 1255     Impression and Plan: Brian Mays is 71 year old gentleman with a neuroendocrine carcinoma and liver metastases. He had a third intrahepatic therapy with yttrium-90 back in November 2016. He has done well on Somatuline. His Chromagranin A in December was 5. Level today is pending.  We will proceed with his Somatuline injection today as planned and he will continue on his monthly injection schedule.  His quality of life at this time is still quite good. We will plan to see him back  in 2 month for repeat lab work and follow-up.  He will contact our office with any questions or concerns. We can certainly see him sooner if need be.   Eliezer Bottom, NP 2/2/201811:14 AM

## 2016-12-06 LAB — CHROMOGRANIN A: Chromogranin A: 18 nmol/L — ABNORMAL HIGH (ref 0–5)

## 2016-12-09 ENCOUNTER — Other Ambulatory Visit: Payer: Self-pay | Admitting: Family

## 2016-12-09 DIAGNOSIS — C7B8 Other secondary neuroendocrine tumors: Secondary | ICD-10-CM

## 2016-12-10 ENCOUNTER — Encounter: Payer: Self-pay | Admitting: Internal Medicine

## 2016-12-10 ENCOUNTER — Ambulatory Visit (INDEPENDENT_AMBULATORY_CARE_PROVIDER_SITE_OTHER): Payer: Medicare HMO | Admitting: Internal Medicine

## 2016-12-10 VITALS — BP 116/58 | HR 56 | Ht 69.0 in | Wt 197.2 lb

## 2016-12-10 DIAGNOSIS — R141 Gas pain: Secondary | ICD-10-CM

## 2016-12-10 DIAGNOSIS — K2100 Gastro-esophageal reflux disease with esophagitis, without bleeding: Secondary | ICD-10-CM

## 2016-12-10 DIAGNOSIS — C7A00 Malignant carcinoid tumor of unspecified site: Secondary | ICD-10-CM | POA: Diagnosis not present

## 2016-12-10 DIAGNOSIS — R197 Diarrhea, unspecified: Secondary | ICD-10-CM | POA: Diagnosis not present

## 2016-12-10 MED ORDER — METRONIDAZOLE 250 MG PO TABS: 250.0000 mg | ORAL_TABLET | Freq: Three times a day (TID) | ORAL | 0 refills | Status: DC

## 2016-12-10 NOTE — Patient Instructions (Signed)
We have sent the following medications to your pharmacy for you to pick up at your convenience: Flagyl  If you are age 70 or older, your body mass index should be between 23-30. Your Body mass index is 29.13 kg/m. If this is out of the aforementioned range listed, please consider follow up with your Primary Care Provider.  If you are age 14 or younger, your body mass index should be between 19-25. Your Body mass index is 29.13 kg/m. If this is out of the aformentioned range listed, please consider follow up with your Primary Care Provider.

## 2016-12-10 NOTE — Progress Notes (Signed)
HISTORY OF PRESENT ILLNESS:  Brian Mays is a 71 y.o. male with remote biliary pancreatitis complicated by pancreatic pseudocyst (1998), status post cholecystectomy, hypertension, hyperlipidemia, GERD, and carcinoid syndrome diagnosed 2010 for which he has undergone partial small bowel resection and remains on octreotide therapy under the guidance of oncology. I last saw the patient in the office November 2014 regarding increased intestinal gas and belching. He was seen most recently by the GI nurse practitioner with complaints of dizziness and belching with exercise. He last underwent complete colonoscopy February 2017. His revealed diverticulosis, incidental AVM, and diminutive inflammatory polyp which was removed. Follow-up in 10 years recommended. He presents today with chief complaint of change in bowel habits over the past 6-8 weeks. He describes waking in the morning and having a normal bowel movement. Later in the morning he will have a loose stool. Thereafter increased intestinal gas. Typically 2 bowel movements per day. No nocturnal symptoms. No bleeding or weight loss. He mentions that he has taken probiotic in that his symptoms have trend toward improvement over time. Does complain of fatigue or sleepiness. He worries about parasites. He shows me a picture of a parasite that he found on the Internet. Tells me that he has had some episodes of dizziness gastric distress when consuming beer. He continues follow-up with interventional radiology regarding his hepatic carcinoid and intermittent ablation therapy. Last imaging study and evaluation with Dr. Kathlene Cote 10/01/2016. Stable disease. No other complaints. He remains active playing golf. Review of outside laboratories from 12/03/2016 reveals unremarkable comprehensive metabolic panel and CBC. He denies GERD symptoms on PPI  REVIEW OF SYSTEMS:  All non-GI ROS negative except for fatigue, epistaxis, occasional shortness of breath,  Past Medical  History:  Diagnosis Date  . Anemia    in past  . Colon polyps   . Diverticulosis   . Gallstones   . GERD (gastroesophageal reflux disease)   . Heart attack    mild, Spring 2017  . Heart murmur   . Hemorrhoid   . Hiatal hernia   . HTN (hypertension)   . Hypothyroidism   . Metastatic carcinoma ([place]) 2010   Dr Jonette Eva  . Pancreatitis 1998   chronic    Past Surgical History:  Procedure Laterality Date  . APPENDECTOMY  1962  . CARDIAC CATHETERIZATION N/A 02/10/2016   Procedure: Left Heart Cath and Coronary Angiography;  Surgeon: Adrian Prows, MD;  Location: [facility];  Service: Cardiovascular;  Laterality: N/A;  . CARDIAC CATHETERIZATION  02/10/2016   Procedure: Coronary/Graft Atherectomy;  Surgeon: Adrian Prows, MD;  Location: [facility];  Service: Cardiovascular;;  . CARDIAC CATHETERIZATION  02/10/2016   Procedure: Coronary Stent Intervention;  Surgeon: Adrian Prows, MD;  Location: [facility];  Service: Cardiovascular;;  . CATARACT EXTRACTION W/ INTRAOCULAR LENS  IMPLANT, BILATERAL Bilateral   . IR GENERIC HISTORICAL  12/30/2015   IR RADIOLOGIST EVAL & MGMT 12/30/2015 Aletta Edouard, MD [facility]  . IR GENERIC HISTORICAL  11/09/2016   IR RADIOLOGIST EVAL & MGMT 11/09/2016 Aletta Edouard, MD [facility] INTER[facility]  . LAPAROSCOPIC CHOLECYSTECTOMY  1999  . LIVER BIOPSY  2010  . RADIOACTIVE SEED IMPLANT  X 3   "to my liver"  . TUMOR EXCISION  01/2009   Carcinoil Resection   . TUMOR REMOVAL     from small intestine    Social History Brian Mays  reports that he quit smoking about 18 years ago. His smoking use included Cigarettes. He  started smoking about 57 years ago. He has a 52.50 pack-year smoking history. He has never used smokeless tobacco. He reports that he drinks about 1.8 - 2.4 oz of alcohol per week . He reports that he does not use drugs.  family history includes COPD in his father; Hyperlipidemia in his mother; Hypertension in his mother; Kidney disease  in his mother; Ulcerative colitis in his daughter.  Allergies  Allergen Reactions  . Iohexol Hives     Code: HIVES, Desc: PER [name] @ PRIMARY CARE, PT IS ALLERGIC TO CONTRAST DYE 10/02/08/RM  05/01/10...needs full premeds per our protocol w/ [facility]., Onset Date: [id number]        PHYSICAL EXAMINATION: Vital signs: BP (!) 116/58 (BP Location: Left Arm, Patient Position: Sitting, Cuff Size: Normal)   Pulse (!) 56   Ht 5\' 9"  (1.753 m)   Wt 197 lb 4 oz (89.5 kg)   BMI 29.13 kg/m   Constitutional: generally well-appearing, no acute distress Psychiatric: alert and oriented x3, cooperative Eyes: extraocular movements intact, anicteric, conjunctiva pink Mouth: oral pharynx moist, no lesions Neck: supple no lymphadenopathy Cardiovascular: heart regular rate and rhythm, no murmur Lungs: clear to auscultation bilaterally Abdomen: soft, nontender, nondistended, no obvious ascites, no peritoneal signs, normal bowel sounds, no organomegaly. Surgical incisions well-healed Rectal:Omitted Extremities: no clubbing cyanosis or lower extremity edema bilaterally Skin: no lesions on visible extremities Neuro: No focal deficits. No asterixis.   ASSESSMENT:  #1. Nonspecific change in bowel habits as manifested by occasional loose stool and increased intestinal gas. #2. Carcinoid syndrome with liver metastasis #3. Non-GI complaints including fatigue\ #4. GERD. Asymptomatic on PPI. Normal EGD 2007  PLAN:  #1. Course of metronidazole 250 mg 4 times a day 10 days. Advised not to consume alcohol while taking metronidazole. He tells me that he has taken this in the past and tolerated it well #2. Reassurance #3. Ongoing management of carcinoid disease per oncology and interventional radiology  #4. Advised return to his PCP for further evaluation of non-GI complaints. He agrees #5. Routine screening colonoscopy 9 years as planned. Interval GI follow-up as needed #6. Reflux precautions and continue  lansoprazole for GERD  25 minutes was spent face-to-face with the patient. Greater than 50% of the time was use for counseling regarding potential etiologies and management of his altered bowel habits as described. Also answering multiple questions.

## 2016-12-15 ENCOUNTER — Other Ambulatory Visit: Payer: Self-pay | Admitting: Hematology & Oncology

## 2016-12-15 DIAGNOSIS — C7B8 Other secondary neuroendocrine tumors: Secondary | ICD-10-CM

## 2016-12-27 ENCOUNTER — Ambulatory Visit (HOSPITAL_COMMUNITY): Payer: Medicare HMO

## 2016-12-30 ENCOUNTER — Other Ambulatory Visit (HOSPITAL_COMMUNITY): Payer: Medicare HMO

## 2016-12-30 ENCOUNTER — Other Ambulatory Visit: Payer: Self-pay | Admitting: *Deleted

## 2016-12-30 DIAGNOSIS — C7B8 Other secondary neuroendocrine tumors: Secondary | ICD-10-CM

## 2016-12-31 ENCOUNTER — Encounter (HOSPITAL_COMMUNITY): Payer: Medicare HMO

## 2016-12-31 ENCOUNTER — Ambulatory Visit (HOSPITAL_BASED_OUTPATIENT_CLINIC_OR_DEPARTMENT_OTHER): Payer: Medicare HMO

## 2016-12-31 ENCOUNTER — Other Ambulatory Visit (HOSPITAL_BASED_OUTPATIENT_CLINIC_OR_DEPARTMENT_OTHER): Payer: Medicare HMO

## 2016-12-31 VITALS — BP 129/55 | HR 55 | Temp 97.7°F | Resp 17

## 2016-12-31 DIAGNOSIS — C7A8 Other malignant neuroendocrine tumors: Secondary | ICD-10-CM

## 2016-12-31 DIAGNOSIS — C7B8 Other secondary neuroendocrine tumors: Secondary | ICD-10-CM | POA: Diagnosis not present

## 2016-12-31 DIAGNOSIS — E340 Carcinoid syndrome: Secondary | ICD-10-CM

## 2016-12-31 LAB — CBC WITH DIFFERENTIAL (CANCER CENTER ONLY)
BASO#: 0 10*3/uL (ref 0.0–0.2)
BASO%: 0.6 % (ref 0.0–2.0)
EOS%: 5.6 % (ref 0.0–7.0)
Eosinophils Absolute: 0.4 10*3/uL (ref 0.0–0.5)
HEMATOCRIT: 40.1 % (ref 38.7–49.9)
HEMOGLOBIN: 12.8 g/dL — AB (ref 13.0–17.1)
LYMPH#: 1.2 10*3/uL (ref 0.9–3.3)
LYMPH%: 17.4 % (ref 14.0–48.0)
MCH: 28.3 pg (ref 28.0–33.4)
MCHC: 31.9 g/dL — ABNORMAL LOW (ref 32.0–35.9)
MCV: 89 fL (ref 82–98)
MONO#: 0.4 10*3/uL (ref 0.1–0.9)
MONO%: 6.2 % (ref 0.0–13.0)
NEUT%: 70.2 % (ref 40.0–80.0)
NEUTROS ABS: 4.7 10*3/uL (ref 1.5–6.5)
Platelets: 180 10*3/uL (ref 145–400)
RBC: 4.52 10*6/uL (ref 4.20–5.70)
RDW: 15.5 % (ref 11.1–15.7)
WBC: 6.7 10*3/uL (ref 4.0–10.0)

## 2016-12-31 LAB — CMP (CANCER CENTER ONLY)
ALBUMIN: 3.4 g/dL (ref 3.3–5.5)
ALK PHOS: 63 U/L (ref 26–84)
ALT: 21 U/L (ref 10–47)
AST: 32 U/L (ref 11–38)
BILIRUBIN TOTAL: 1 mg/dL (ref 0.20–1.60)
BUN, Bld: 13 mg/dL (ref 7–22)
CALCIUM: 9.5 mg/dL (ref 8.0–10.3)
CO2: 31 mEq/L (ref 18–33)
Chloride: 100 mEq/L (ref 98–108)
Creat: 1.2 mg/dl (ref 0.6–1.2)
Glucose, Bld: 212 mg/dL — ABNORMAL HIGH (ref 73–118)
Potassium: 4.5 mEq/L (ref 3.3–4.7)
Sodium: 145 mEq/L (ref 128–145)
Total Protein: 7.6 g/dL (ref 6.4–8.1)

## 2016-12-31 MED ORDER — LANREOTIDE ACETATE 120 MG/0.5ML ~~LOC~~ SOLN
SUBCUTANEOUS | Status: AC
  Filled 2016-12-31: qty 120

## 2016-12-31 MED ORDER — LANREOTIDE ACETATE 120 MG/0.5ML ~~LOC~~ SOLN
120.0000 mg | Freq: Once | SUBCUTANEOUS | Status: AC
  Administered 2016-12-31: 120 mg via SUBCUTANEOUS

## 2017-01-16 ENCOUNTER — Other Ambulatory Visit: Payer: Self-pay | Admitting: Family Medicine

## 2017-01-28 ENCOUNTER — Ambulatory Visit (HOSPITAL_BASED_OUTPATIENT_CLINIC_OR_DEPARTMENT_OTHER): Payer: Medicare HMO

## 2017-01-28 ENCOUNTER — Other Ambulatory Visit (HOSPITAL_BASED_OUTPATIENT_CLINIC_OR_DEPARTMENT_OTHER): Payer: Medicare HMO

## 2017-01-28 ENCOUNTER — Ambulatory Visit (HOSPITAL_BASED_OUTPATIENT_CLINIC_OR_DEPARTMENT_OTHER): Payer: Medicare HMO | Admitting: Hematology & Oncology

## 2017-01-28 VITALS — BP 123/66 | HR 56 | Temp 98.3°F | Resp 16 | Wt 197.0 lb

## 2017-01-28 DIAGNOSIS — C7A8 Other malignant neuroendocrine tumors: Secondary | ICD-10-CM

## 2017-01-28 DIAGNOSIS — C7B8 Other secondary neuroendocrine tumors: Secondary | ICD-10-CM

## 2017-01-28 DIAGNOSIS — I10 Essential (primary) hypertension: Secondary | ICD-10-CM

## 2017-01-28 LAB — CBC WITH DIFFERENTIAL (CANCER CENTER ONLY)
BASO#: 0 10*3/uL (ref 0.0–0.2)
BASO%: 0.6 % (ref 0.0–2.0)
EOS%: 5.8 % (ref 0.0–7.0)
Eosinophils Absolute: 0.4 10*3/uL (ref 0.0–0.5)
HEMATOCRIT: 36.3 % — AB (ref 38.7–49.9)
HGB: 11.7 g/dL — ABNORMAL LOW (ref 13.0–17.1)
LYMPH#: 1.2 10*3/uL (ref 0.9–3.3)
LYMPH%: 17.5 % (ref 14.0–48.0)
MCH: 28.3 pg (ref 28.0–33.4)
MCHC: 32.2 g/dL (ref 32.0–35.9)
MCV: 88 fL (ref 82–98)
MONO#: 0.5 10*3/uL (ref 0.1–0.9)
MONO%: 7.9 % (ref 0.0–13.0)
NEUT#: 4.5 10*3/uL (ref 1.5–6.5)
NEUT%: 68.2 % (ref 40.0–80.0)
PLATELETS: 158 10*3/uL (ref 145–400)
RBC: 4.13 10*6/uL — AB (ref 4.20–5.70)
RDW: 14.9 % (ref 11.1–15.7)
WBC: 6.6 10*3/uL (ref 4.0–10.0)

## 2017-01-28 LAB — CMP (CANCER CENTER ONLY)
ALK PHOS: 60 U/L (ref 26–84)
ALT: 16 U/L (ref 10–47)
AST: 27 U/L (ref 11–38)
Albumin: 3.1 g/dL — ABNORMAL LOW (ref 3.3–5.5)
BILIRUBIN TOTAL: 1 mg/dL (ref 0.20–1.60)
BUN: 12 mg/dL (ref 7–22)
CALCIUM: 9.2 mg/dL (ref 8.0–10.3)
CO2: 29 mEq/L (ref 18–33)
Chloride: 104 mEq/L (ref 98–108)
Creat: 0.9 mg/dl (ref 0.6–1.2)
Glucose, Bld: 91 mg/dL (ref 73–118)
Potassium: 3.7 mEq/L (ref 3.3–4.7)
Sodium: 141 mEq/L (ref 128–145)
TOTAL PROTEIN: 7.4 g/dL (ref 6.4–8.1)

## 2017-01-28 MED ORDER — LANREOTIDE ACETATE 120 MG/0.5ML ~~LOC~~ SOLN
120.0000 mg | Freq: Once | SUBCUTANEOUS | Status: AC
  Administered 2017-01-28: 120 mg via SUBCUTANEOUS

## 2017-01-28 MED ORDER — LANREOTIDE ACETATE 120 MG/0.5ML ~~LOC~~ SOLN
SUBCUTANEOUS | Status: AC
  Filled 2017-01-28: qty 120

## 2017-01-28 MED ORDER — METOPROLOL TARTRATE 25 MG PO TABS: 25.0000 mg | ORAL_TABLET | Freq: Two times a day (BID) | ORAL | Status: DC

## 2017-01-28 NOTE — Patient Instructions (Signed)
Lanreotide injection What is this medicine? LANREOTIDE (lan REE oh tide) is used to reduce blood levels of growth hormone in patients with a condition called acromegaly. It also works to slow or stop tumor growth in patients with neuroendocrine tumors and treat carcinoid syndrome. This medicine may be used for other purposes; ask your health care provider or pharmacist if you have questions. COMMON BRAND NAME(S): Somatuline [place] What should I tell my health care provider before I take this medicine? They need to know if you have any of these conditions: -diabetes -gallbladder disease -heart disease -kidney disease -liver disease -thyroid disease -an unusual or allergic reaction to lanreotide, other medicines, foods, dyes, or preservatives -pregnant or trying to get pregnant -breast-feeding How should I use this medicine? This medicine is for injection under the skin. It is given by a health care professional in a hospital or clinic setting. Contact your pediatrician or health care professional regarding the use of this medicine in children. Special care may be needed. Overdosage: If you think you have taken too much of this medicine contact a poison control center or emergency room at once. NOTE: This medicine is only for you. Do not share this medicine with others. What if I miss a dose? It is important not to miss your dose. Call your doctor or health care professional if you are unable to keep an appointment. What may interact with this medicine? This medicine may interact with the following medications: -bromocriptine -cyclosporine -certain medicines for blood pressure, heart disease, irregular heart beat -certain medicines for diabetes -quinidine -terfenadine This list may not describe all possible interactions. Give your health care provider a list of all the medicines, herbs, non-prescription drugs, or dietary supplements you use. Also tell them if you smoke, drink alcohol, or  use illegal drugs. Some items may interact with your medicine. What should I watch for while using this medicine? Tell your doctor or healthcare professional if your symptoms do not start to get better or if they get worse. Visit your doctor or health care professional for regular checks on your progress. Your condition will be monitored carefully while you are receiving this medicine. You may need blood work done while you are taking this medicine. Women should inform their doctor if they wish to become pregnant or think they might be pregnant. There is a potential for serious side effects to an unborn child. Talk to your health care professional or pharmacist for more information. Do not breast-feed an infant while taking this medicine or for 6 months after stopping it. This medicine has caused ovarian failure in some women. This medicine may interfere with the ability to have a child. Talk with your doctor or health care professional if you are concerned about your fertility. What side effects may I notice from receiving this medicine? Side effects that you should report to your doctor or health care professional as soon as possible: -allergic reactions like skin rash, itching or hives, swelling of the face, lips, or tongue -increased blood pressure -severe stomach pain -signs and symptoms of high blood sugar such as dizziness; dry mouth; dry skin; fruity breath; nausea; stomach pain; increased hunger or thirst; increased urination -signs and symptoms of low blood sugar such as feeling anxious; confusion; dizziness; increased hunger; unusually weak or tired; sweating; shakiness; cold; irritable; headache; blurred vision; fast heartbeat; loss of consciousness -unusually slow heartbeat Side effects that usually do not require medical attention (report to your doctor or health care professional if they continue   or are bothersome): -constipation -diarrhea -dizziness -headache -muscle pain -muscle  spasms -nausea -pain, redness, or irritation at site where injected This list may not describe all possible side effects. Call your doctor for medical advice about side effects. You may report side effects to FDA at 1-800-FDA-1088. Where should I keep my medicine? This drug is given in a hospital or clinic and will not be stored at home. NOTE: This sheet is a summary. It may not cover all possible information. If you have questions about this medicine, talk to your doctor, pharmacist, or health care provider.  2018 Elsevier/Gold Standard (2016-07-23 10:33:47)  

## 2017-01-31 LAB — CHROMOGRANIN A: Chromogranin A: 14 nmol/L — ABNORMAL HIGH (ref 0–5)

## 2017-01-31 NOTE — Progress Notes (Signed)
Hematology and Oncology Follow Up Visit  Brian Mays [id number] 1945-12-27 71 y.o. 01/31/2017   Principle Diagnosis:  Metastatic low grade neuroendocrine tumor-hepatic metastases  Current Therapy:   S/p third yttrium-90 intrahepatic therapy - November 2016 Somatuline 120 mg monthly    Interim History: Brian Mays is here today for a follow-up. He is doing pretty well. He has had no specific complaints. He does state that he might be flushing a little bit more. He also states possibly will do more diarrhea.  His last chromogranin A low back in February was 21.  His last scans were done back in December.  He is still working part-time. He is trying to play more golf.  He's had no issues with nausea or vomiting. He's had no rashes. He had no leg swelling. He's had no headache.  Overall, his performance status is ECOG 0.  Medications:  Allergies as of 01/28/2017      Reactions   Iohexol Hives    Code: HIVES, Desc: PER [name] @ PRIMARY CARE, PT IS ALLERGIC TO CONTRAST DYE 10/02/08/RM  05/01/10...needs full premeds per our protocol w/ [facility]., Onset Date: [id number]      Medication List       Accurate as of 01/28/17 11:59 PM. Always use your most recent med list.          aspirin EC 81 MG tablet Take 81 mg by mouth every morning.   atorvastatin 80 MG tablet Commonly known as:  LIPITOR Take 1 tablet (80 mg total) by mouth daily at 6 PM.   Cholecalciferol 1000 units tablet Take 1,000 Units by mouth daily.   Fish Oil 1000 MG Caps Take 1 capsule by mouth daily. Reported on 12/30/2015   fluticasone 50 MCG/ACT nasal spray Commonly known as:  FLONASE Place 2 sprays into both nostrils daily as needed (sinuses).   hydrocortisone 25 MG suppository Commonly known as:  ANUSOL-HC INSERT 1 SUPPOSITORY RECTALLY TWICE A DAY AS NEEDED FOR HEMORRHOIDS   lansoprazole 30 MG capsule Commonly known as:  PREVACID Take 1 capsule (30 mg total) by mouth daily at 12 noon.   levothyroxine  50 MCG tablet Commonly known as:  SYNTHROID, LEVOTHROID Take 1 tablet (50 mcg total) by mouth daily.   losartan 100 MG tablet Commonly known as:  COZAAR Take 1 tablet (100 mg total) by mouth at bedtime.   metoprolol tartrate 25 MG tablet Commonly known as:  LOPRESSOR Take 1 tablet (25 mg total) by mouth 2 (two) times daily.   nitroGLYCERIN 0.4 MG SL tablet Commonly known as:  NITROSTAT Place 1 tablet (0.4 mg total) under the tongue every 5 (five) minutes x 3 doses as needed for chest pain.   PARoxetine 20 MG tablet Commonly known as:  PAXIL Take 1 tablet (20 mg total) by mouth daily.   predniSONE 50 MG tablet Commonly known as:  DELTASONE Take 50mg  at 13 hours, 7 hours, and 1 hour before scan   sodium chloride 0.65 % Soln nasal spray Commonly known as:  OCEAN Place 1 spray into both nostrils as needed for congestion.   SOMATULINE DEPOT [place] Inject 120 mcg into the skin every 28 (twenty-eight) days. Receives at Dr Antonieta Pert office   ticagrelor 90 MG Tabs tablet Commonly known as:  BRILINTA Take 1 tablet (90 mg total) by mouth 2 (two) times daily.   vitamin B-12 1000 MCG tablet Commonly known as:  CYANOCOBALAMIN Take 1,000 mcg by mouth daily.       Allergies:  Allergies  Allergen Reactions  . Iohexol Hives     Code: HIVES, Desc: PER [name] @ PRIMARY CARE, PT IS ALLERGIC TO CONTRAST DYE 10/02/08/RM  05/01/10...needs full premeds per our protocol w/ [facility]., Onset Date: [id number]     Past Medical History, Surgical history, Social history, and Family History were reviewed and updated.  Review of Systems: All other 10 point review of systems is negative.   Physical Exam:  weight is 197 lb (89.4 kg). His oral temperature is 98.3 F (36.8 C). His blood pressure is 123/66 and his pulse is 56 (abnormal). His respiration is 16 and oxygen saturation is 100%.   Wt Readings from Last 3 Encounters:  01/28/17 197 lb (89.4 kg)  12/10/16 197 lb 4 oz (89.5 kg)  12/03/16 193 lb  (87.5 kg)    Well-developed and well-nourished white male in no obvious distress. Head and neck exam shows no ocular or oral lesions. He has no obvious facial flushing. He has no adenopathy in the neck. Lungs are clear bilaterally. Cardiac exam regular rate and rhythm with no murmurs, rubs or bruits. Abdomen is soft. He has good bowel sounds. There is no fluid wave. There is no palpable liver or spleen tip. Back exam shows no tenderness over the spine, ribs or hips. Extremities shows no clubbing, cyanosis or edema. Skin exam shows no rashes, ecchymoses or petechia. Neurological exam shows no focal neurological deficits.   Lab Results  Component Value Date   WBC 6.6 01/28/2017   HGB 11.7 (L) 01/28/2017   HCT 36.3 (L) 01/28/2017   MCV 88 01/28/2017   PLT 158 01/28/2017   No results found for: FERRITIN, IRON, TIBC, UIBC, IRONPCTSAT Lab Results  Component Value Date   RBC 4.13 (L) 01/28/2017   No results found for: KPAFRELGTCHN, LAMBDASER, KAPLAMBRATIO No results found for: IGGSERUM, IGA, IGMSERUM No results found for: Odetta Pink, SPEI   Chemistry      Component Value Date/Time   NA 141 01/28/2017 1157   NA 141 07/23/2016 1255   K 3.7 01/28/2017 1157   K 4.1 07/23/2016 1255   CL 104 01/28/2017 1157   CO2 29 01/28/2017 1157   CO2 27 07/23/2016 1255   BUN 12 01/28/2017 1157   BUN 13.8 07/23/2016 1255   CREATININE 0.9 01/28/2017 1157   CREATININE 1.0 07/23/2016 1255      Component Value Date/Time   CALCIUM 9.2 01/28/2017 1157   CALCIUM 9.2 07/23/2016 1255   ALKPHOS 60 01/28/2017 1157   ALKPHOS 77 07/23/2016 1255   AST 27 01/28/2017 1157   AST 23 07/23/2016 1255   ALT 16 01/28/2017 1157   ALT 13 07/23/2016 1255   BILITOT 1.00 01/28/2017 1157   BILITOT 1.13 07/23/2016 1255     Impression and Plan: Brian Mays is 71 year old gentleman with a neuroendocrine carcinoma and liver metastases. He had a third intrahepatic therapy  with yttrium-90 back in November 2016.   It is hard to say whether or not there is any type of progression. It is been about 4 months since he had his last set of scans.   We will plan to get him back to see [place] in another couple weeks just to follow-up.  I probably would get some scans set up for him in May.  It is possible that he may be a candidate for the new radioisotope therapy-Lutathera.   Volanda Napoleon, MD 4/2/20187:06 AM

## 2017-02-09 ENCOUNTER — Ambulatory Visit (HOSPITAL_BASED_OUTPATIENT_CLINIC_OR_DEPARTMENT_OTHER): Payer: Medicare HMO | Admitting: Family

## 2017-02-09 ENCOUNTER — Ambulatory Visit (HOSPITAL_BASED_OUTPATIENT_CLINIC_OR_DEPARTMENT_OTHER): Payer: Medicare HMO

## 2017-02-09 ENCOUNTER — Other Ambulatory Visit: Payer: Self-pay | Admitting: Family

## 2017-02-09 VITALS — BP 138/61 | HR 53 | Temp 98.2°F | Resp 18 | Wt 196.4 lb

## 2017-02-09 DIAGNOSIS — C7A8 Other malignant neuroendocrine tumors: Secondary | ICD-10-CM | POA: Diagnosis not present

## 2017-02-09 DIAGNOSIS — C7B8 Other secondary neuroendocrine tumors: Secondary | ICD-10-CM | POA: Diagnosis not present

## 2017-02-09 DIAGNOSIS — C7B02 Secondary carcinoid tumors of liver: Secondary | ICD-10-CM | POA: Diagnosis not present

## 2017-02-09 LAB — COMPREHENSIVE METABOLIC PANEL (CC13)
ALT: 16 IU/L (ref 0–44)
AST (SGOT): 29 IU/L (ref 0–40)
Albumin, Serum: 3.5 g/dL — ABNORMAL LOW (ref 3.6–4.8)
Albumin/Globulin Ratio: 1 — ABNORMAL LOW (ref 1.2–2.2)
Alkaline Phosphatase, S: 63 IU/L (ref 39–117)
BILIRUBIN TOTAL: 0.8 mg/dL (ref 0.0–1.2)
BUN/Creatinine Ratio: 12 (ref 10–24)
BUN: 12 mg/dL (ref 8–27)
CALCIUM: 9.1 mg/dL (ref 8.6–10.2)
CO2: 27 mmol/L (ref 18–29)
Chloride, Ser: 101 mmol/L (ref 96–106)
Creatinine, Ser: 1 mg/dL (ref 0.76–1.27)
GFR, EST AFRICAN AMERICAN: 88 mL/min/{1.73_m2} (ref >59)
GFR, EST NON AFRICAN AMERICAN: 76 mL/min/{1.73_m2} (ref >59)
GLUCOSE: 158 mg/dL — AB (ref 65–99)
Globulin, Total: 3.6 g/dL (ref 1.5–4.5)
Potassium, Ser: 4.2 mmol/L (ref 3.5–5.2)
Sodium: 139 mmol/L (ref 134–144)
TOTAL PROTEIN: 7.1 g/dL (ref 6.0–8.5)

## 2017-02-09 LAB — CBC WITH DIFFERENTIAL (CANCER CENTER ONLY)
BASO#: 0 10*3/uL (ref 0.0–0.2)
BASO%: 0.3 % (ref 0.0–2.0)
EOS%: 7.8 % — AB (ref 0.0–7.0)
Eosinophils Absolute: 0.5 10*3/uL (ref 0.0–0.5)
HCT: 35.8 % — ABNORMAL LOW (ref 38.7–49.9)
HGB: 11.4 g/dL — ABNORMAL LOW (ref 13.0–17.1)
LYMPH#: 1.2 10*3/uL (ref 0.9–3.3)
LYMPH%: 18 % (ref 14.0–48.0)
MCH: 28 pg (ref 28.0–33.4)
MCHC: 31.8 g/dL — ABNORMAL LOW (ref 32.0–35.9)
MCV: 88 fL (ref 82–98)
MONO#: 0.5 10*3/uL (ref 0.1–0.9)
MONO%: 7.3 % (ref 0.0–13.0)
NEUT%: 66.6 % (ref 40.0–80.0)
NEUTROS ABS: 4.4 10*3/uL (ref 1.5–6.5)
PLATELETS: 151 10*3/uL (ref 145–400)
RBC: 4.07 10*6/uL — AB (ref 4.20–5.70)
RDW: 14.9 % (ref 11.1–15.7)
WBC: 6.6 10*3/uL (ref 4.0–10.0)

## 2017-02-09 NOTE — Progress Notes (Signed)
Hematology and Oncology Follow Up Visit  BEXLEY MCLESTER [id number] 1946-10-26 71 y.o. 02/09/2017  Principle Diagnosis:  Metastatic low grade neuroendocrine tumor-hepatic metastases  Current Therapy:   S/p third yttrium-90 intrahepatic therapy - November 2016 Somatuline 120 mg monthly   Interim History:  Mr. Tuggle is here today for follow-up. He is doing well and staying active. He plans to play golf all weekend with one of his friends since the weather is supposed to be nice.  He feels that the Paxil he has been taking made his balance and diarrhea worse. He has started to wean himself of and his symptoms have improvedf. He went from 20 mg daily to 10 mg daily for the last week. He asked if he could go ahead and stop taking it and that will be fine.  His Chromogranin A was down to 14 in March. Today's level is pending.  He has occasional flushing and also occasional episodes of SOB with over exertion and will take breaks to rest if needed.  No fever, chills, n/v, cough, rash, dizziness, SOB, chest pain, palpitations, abdominal pain or changes in bladder habits.  No swelling, tenderness, numbness or tingling in his extremities. No falls or syncopal episodes.  No bleeding, bruising or petechiae. No lymphadenopathy found on exam.  He has maintained a good appetite and is staying well hydrated. His weight is stable.   ECOG Performance Status: 0 - Asymptomatic  Medications:  Allergies as of 02/09/2017      Reactions   Iohexol Hives    Code: HIVES, Desc: PER [name] @ [facility]IMARY [facility], PT IS ALLERGIC TO CONTRAST DYE 10/02/08/RM  05/01/10...needs full premeds per our protocol w/ [facility]., Onset Date: [id number]      Medication List       Accurate as of 02/09/17 10:31 PM. Always use your most recent med list.          aspirin EC 81 MG tablet Take 81 mg by mouth every morning.   atorvastatin 80 MG tablet Commonly known as:  LIPITOR Take 1 tablet (80 mg total) by mouth daily at 6 PM.     Cholecalciferol 1000 units tablet Take 1,000 Units by mouth daily.   Fish Oil 1000 MG Caps Take 1 capsule by mouth daily. Reported on 12/30/2015   fluticasone 50 MCG/ACT nasal spray Commonly known as:  FLONASE Place 2 sprays into both nostrils daily as needed (sinuses).   hydrocortisone 25 MG suppository Commonly known as:  ANUSOL-HC INSERT 1 SUPPOSITORY RECTALLY TWICE A DAY AS NEEDED FOR HEMORRHOIDS   lansoprazole 30 MG capsule Commonly known as:  PREVACID Take 1 capsule (30 mg total) by mouth daily at 12 noon.   levothyroxine 50 MCG tablet Commonly known as:  SYNTHROID, LEVOTHROID Take 1 tablet (50 mcg total) by mouth daily.   losartan 100 MG tablet Commonly known as:  COZAAR Take 1 tablet (100 mg total) by mouth at bedtime.   metoprolol tartrate 25 MG tablet Commonly known as:  LOPRESSOR Take 1 tablet (25 mg total) by mouth 2 (two) times daily.   nitroGLYCERIN 0.4 MG SL tablet Commonly known as:  NITROSTAT Place 1 tablet (0.4 mg total) under the tongue every 5 (five) minutes x 3 doses as needed for chest pain.   PARoxetine 20 MG tablet Commonly known as:  PAXIL Take 1 tablet (20 mg total) by mouth daily.   predniSONE 50 MG tablet Commonly known as:  DELTASONE Take 50mg  at 13 hours, 7 hours, and 1 hour before  scan   sodium chloride 0.65 % Soln nasal spray Commonly known as:  OCEAN Place 1 spray into both nostrils as needed for congestion.   SOMATULINE DEPOT [place] Inject 120 mcg into the skin every 28 (twenty-eight) days. Receives at Dr Antonieta Pert office   ticagrelor 90 MG Tabs tablet Commonly known as:  BRILINTA Take 1 tablet (90 mg total) by mouth 2 (two) times daily.   vitamin B-12 1000 MCG tablet Commonly known as:  CYANOCOBALAMIN Take 1,000 mcg by mouth daily.       Allergies:  Allergies  Allergen Reactions  . Iohexol Hives     Code: HIVES, Desc: PER [name] @ [facility]IMARY [facility], PT IS ALLERGIC TO CONTRAST DYE 10/02/08/RM  05/01/10...needs full premeds per our  protocol w/ [facility]., Onset Date: [id number]     Past Medical History, Surgical history, Social history, and Family History were reviewed and updated.  Review of Systems: All other 10 point review of systems is negative.   Physical Exam:  weight is 196 lb 6.4 oz (89.1 kg). His oral temperature is 98.2 F (36.8 C). His blood pressure is 138/61 and his pulse is 53 (abnormal). His respiration is 18 and oxygen saturation is 96%.   Wt Readings from Last 3 Encounters:  02/09/17 196 lb 6.4 oz (89.1 kg)  01/28/17 197 lb (89.4 kg)  12/10/16 197 lb 4 oz (89.5 kg)    Ocular: Sclerae unicteric, pupils equal, round and reactive to light Ear-nose-throat: Oropharynx clear, dentition fair Lymphatic: No cervical, supraclavicular or axillary adenopathy Lungs no rales or rhonchi, good excursion bilaterally Heart regular rate and rhythm, no murmur appreciated Abd soft, nontender, positive bowel sounds, no liver or spleen tip palpated on exam, no fluid wave MSK no focal spinal tenderness, no joint edema Neuro: non-focal, well-oriented, appropriate affect Breasts: Deferred  Lab Results  Component Value Date   WBC 6.6 02/09/2017   HGB 11.4 (L) 02/09/2017   HCT 35.8 (L) 02/09/2017   MCV 88 02/09/2017   PLT 151 02/09/2017   No results found for: FERRITIN, IRON, TIBC, UIBC, IRONPCTSAT Lab Results  Component Value Date   RBC 4.07 (L) 02/09/2017   No results found for: KPAFRELGTCHN, LAMBDASER, KAPLAMBRATIO No results found for: IGGSERUM, IGA, IGMSERUM No results found for: Odetta Pink, SPEI   Chemistry      Component Value Date/Time   NA 139 02/09/2017 1528   NA 141 01/28/2017 1157   NA 141 07/23/2016 1255   K 4.2 02/09/2017 1528   K 3.7 01/28/2017 1157   K 4.1 07/23/2016 1255   CL 101 02/09/2017 1528   CL 104 01/28/2017 1157   CO2 27 02/09/2017 1528   CO2 29 01/28/2017 1157   CO2 27 07/23/2016 1255   BUN 12 02/09/2017 1528    BUN 12 01/28/2017 1157   BUN 13.8 07/23/2016 1255   CREATININE 1.00 02/09/2017 1528   CREATININE 0.9 01/28/2017 1157   CREATININE 1.0 07/23/2016 1255      Component Value Date/Time   CALCIUM 9.1 02/09/2017 1528   CALCIUM 9.2 01/28/2017 1157   CALCIUM 9.2 07/23/2016 1255   ALKPHOS 63 02/09/2017 1528   ALKPHOS 60 01/28/2017 1157   ALKPHOS 77 07/23/2016 1255   AST 29 02/09/2017 1528   AST 27 01/28/2017 1157   AST 23 07/23/2016 1255   ALT 16 02/09/2017 1528   ALT 16 01/28/2017 1157   ALT 13 07/23/2016 1255   BILITOT 0.8 02/09/2017 1528   BILITOT  1.00 01/28/2017 1157   BILITOT 1.13 07/23/2016 1255     Impression and Plan: Mr. Steidle is a pleasant 71 yo caucasian gentleman with a neuroendocrine carcinoma and liver metastases. He had a third intrahepatic therapy with yttrium-90 back in November 2016. He is feeling a little better since weaning off the Paxil.  Chromogranin A last month was down to 14.  We will repeat scans in May.  We will plan to see him back next month for repeat lab work, follow-up, injection and scans.  I spent 15 minutes face to face counseling with the patient.  He will contact our office with any questions or concerns. We can certainly see him sooner if need be.   Eliezer Bottom, NP 4/11/201810:31 PM

## 2017-02-10 ENCOUNTER — Other Ambulatory Visit: Payer: Self-pay | Admitting: Hematology & Oncology

## 2017-02-10 LAB — CHROMOGRANIN A: Chromogranin A: 9 nmol/L — ABNORMAL HIGH (ref 0–5)

## 2017-02-11 ENCOUNTER — Telehealth: Payer: Self-pay | Admitting: *Deleted

## 2017-02-11 ENCOUNTER — Ambulatory Visit: Payer: Medicare HMO | Admitting: Family

## 2017-02-11 ENCOUNTER — Other Ambulatory Visit: Payer: Medicare HMO

## 2017-02-11 NOTE — Telephone Encounter (Addendum)
Patient is aware of results.  ----- Message from Eliezer Bottom, NP sent at 02/10/2017 10:47 PM EDT ----- Regarding: Chrom A Chromogranin A is down to 9! Margit Banda! Thank you!  Brian Mays  ----- Message ----- From: Interface, Lab In Three Zero One Sent: 02/09/2017   3:40 PM To: Eliezer Bottom, NP

## 2017-02-18 ENCOUNTER — Ambulatory Visit (HOSPITAL_BASED_OUTPATIENT_CLINIC_OR_DEPARTMENT_OTHER): Payer: Medicare HMO

## 2017-02-18 ENCOUNTER — Other Ambulatory Visit: Payer: Self-pay | Admitting: Family

## 2017-02-18 DIAGNOSIS — Z91041 Radiographic dye allergy status: Secondary | ICD-10-CM

## 2017-02-18 MED ORDER — PREDNISONE 50 MG PO TABS: ORAL_TABLET | ORAL | 2 refills | Status: DC

## 2017-02-18 MED ORDER — DIPHENHYDRAMINE HCL 50 MG PO TABS: 50.0000 mg | ORAL_TABLET | Freq: Once | ORAL | 0 refills | Status: DC

## 2017-02-18 NOTE — Progress Notes (Signed)
Pre med order placed for CT scan.

## 2017-02-22 ENCOUNTER — Other Ambulatory Visit: Payer: Self-pay | Admitting: Family Medicine

## 2017-02-23 ENCOUNTER — Other Ambulatory Visit (HOSPITAL_COMMUNITY): Payer: Self-pay | Admitting: Interventional Radiology

## 2017-02-23 DIAGNOSIS — C7A8 Other malignant neuroendocrine tumors: Secondary | ICD-10-CM

## 2017-02-23 DIAGNOSIS — C787 Secondary malignant neoplasm of liver and intrahepatic bile duct: Secondary | ICD-10-CM

## 2017-02-24 ENCOUNTER — Ambulatory Visit (HOSPITAL_BASED_OUTPATIENT_CLINIC_OR_DEPARTMENT_OTHER)
Admission: RE | Admit: 2017-02-24 | Discharge: 2017-02-24 | Disposition: A | Discharge status: Home / Self Care | Payer: Medicare HMO | Source: Ambulatory Visit | Attending: Family | Admitting: Family

## 2017-02-24 DIAGNOSIS — C787 Secondary malignant neoplasm of liver and intrahepatic bile duct: Secondary | ICD-10-CM | POA: Insufficient documentation

## 2017-02-24 DIAGNOSIS — C254 Malignant neoplasm of endocrine pancreas: Secondary | ICD-10-CM | POA: Diagnosis not present

## 2017-02-24 DIAGNOSIS — C7B8 Other secondary neuroendocrine tumors: Secondary | ICD-10-CM | POA: Insufficient documentation

## 2017-02-24 IMAGING — CT CT ABD-PELV W/ CM
2 of 5 series · 16 of 46 positions shown, 18 images · IV contrast (APPLIED)
Comparison: [DATE]

CLINICAL DATA: Metastatic neuroendocrine tumor.

EXAM:
CT ABDOMEN AND PELVIS WITH CONTRAST
TECHNIQUE: Multidetector CT imaging of the abdomen and pelvis was performed
using the standard protocol following bolus administration of
intravenous contrast.
CONTRAST:  100mL [80] IOPAMIDOL ([80]) INJECTION 61%

[Series 2: axial st · axial · 0.88mm/px · z∈[-505,-60]mm · 13 of 99 slices shown, 15 images]
[im 5/99  soft-tissue]
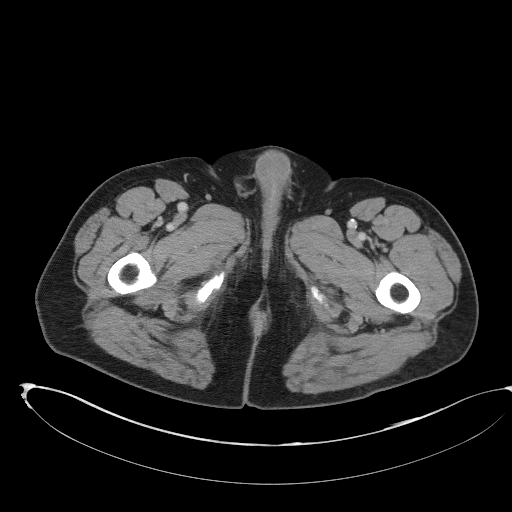
[im 5/99  bone]
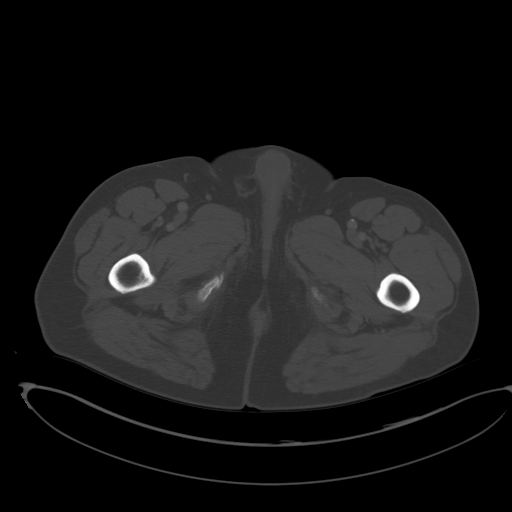
[im 15/99  soft-tissue]
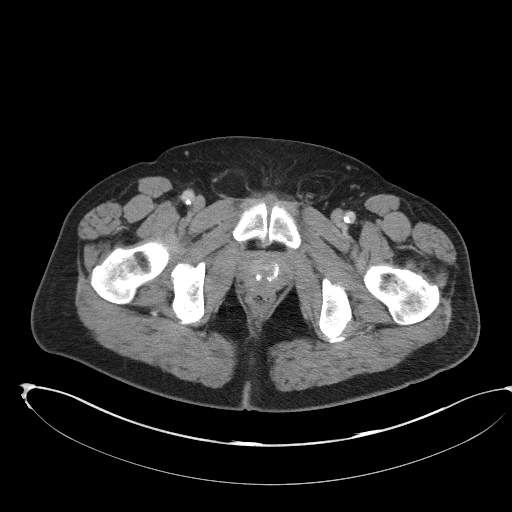
[im 20/99  soft-tissue]
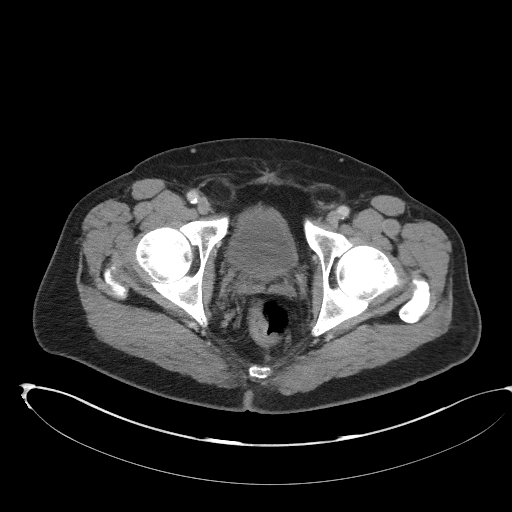
[im 30/99  soft-tissue]
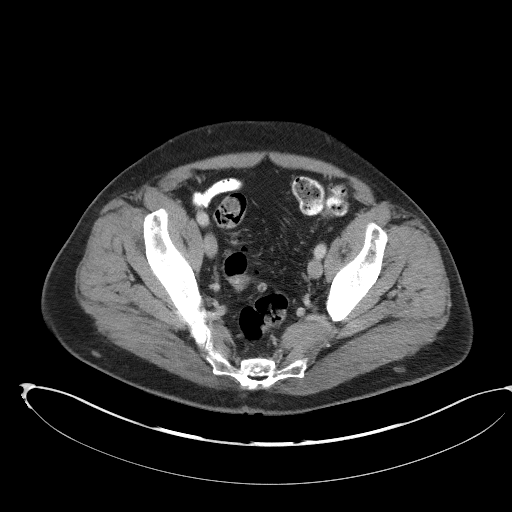
[im 35/99  soft-tissue]
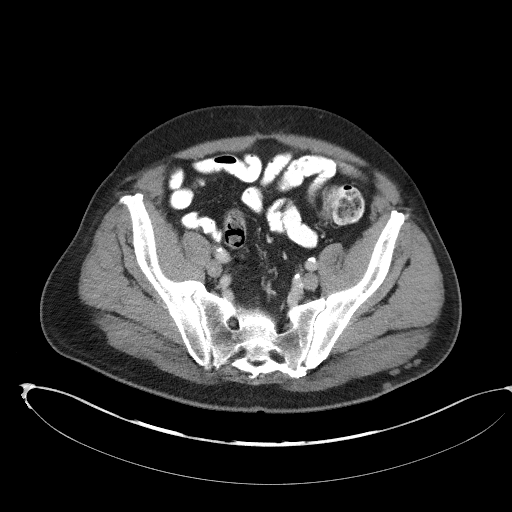
[im 45/99  soft-tissue]
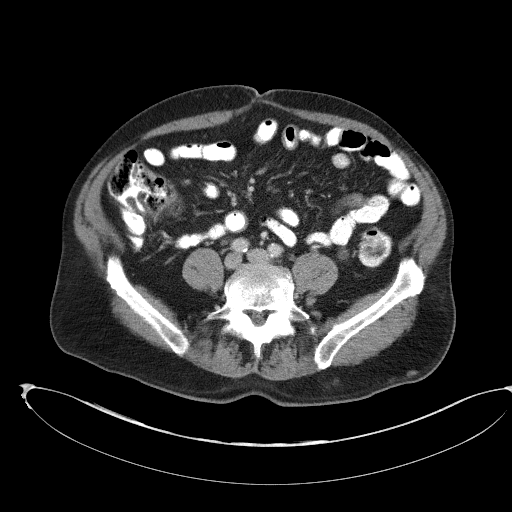
[im 50/99  soft-tissue]
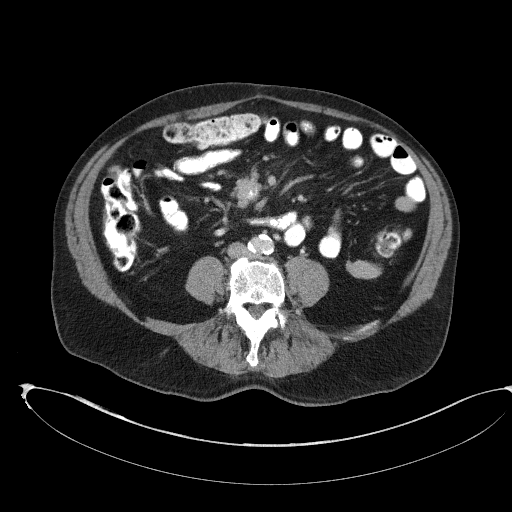
[im 54/99  soft-tissue]
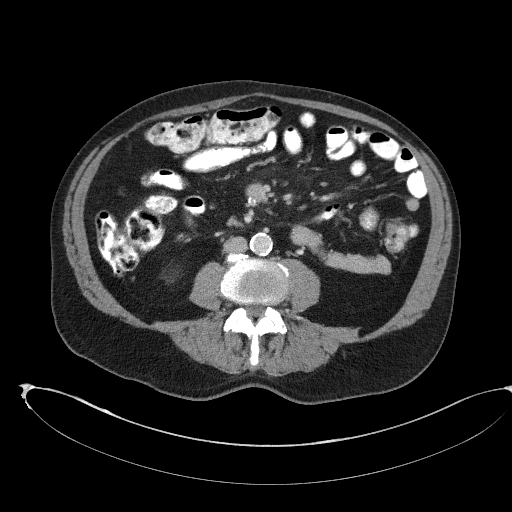
[im 64/99  soft-tissue]
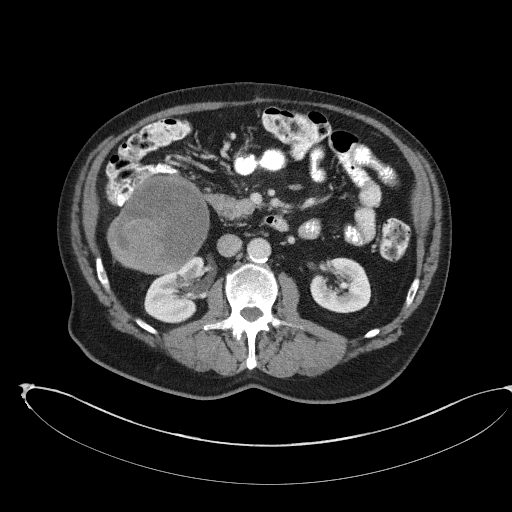
[im 64/99  bone]
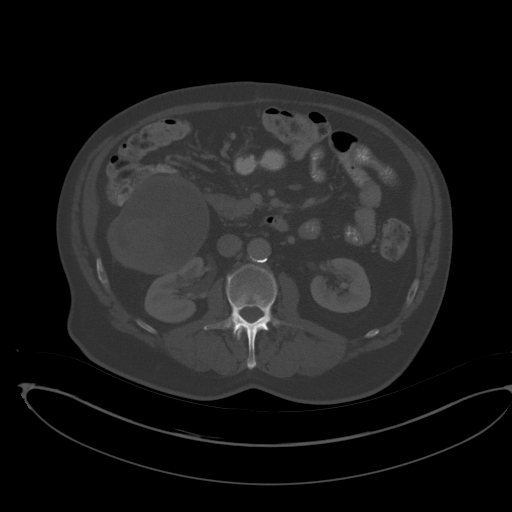
[im 69/99  soft-tissue]
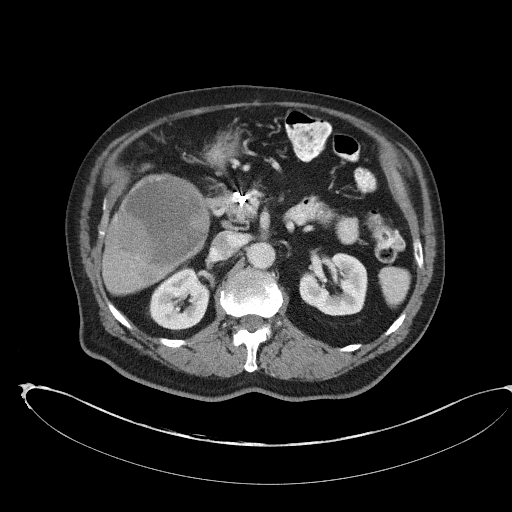
[im 79/99  soft-tissue]
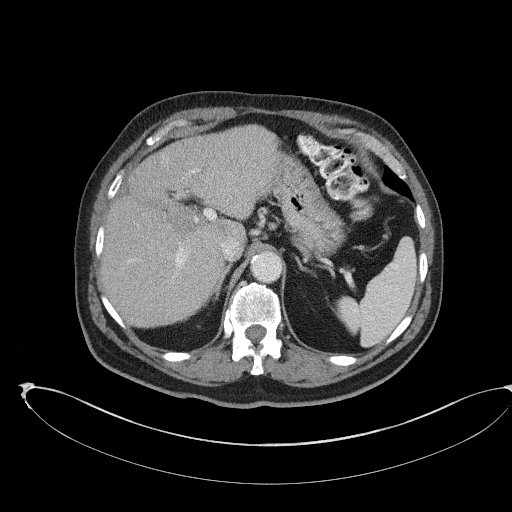
[im 84/99  soft-tissue]
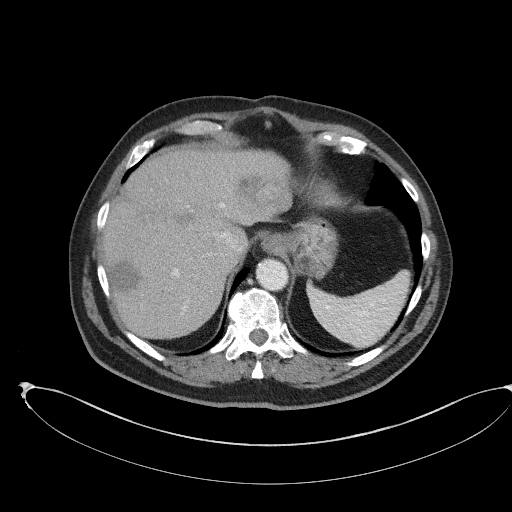
[im 94/99  soft-tissue]
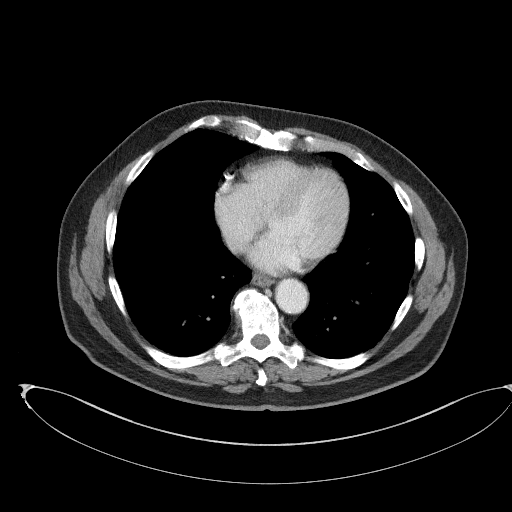

[Series 4: coronal st · coronal · 0.84mm/px · 3 of 100 slices shown]
[im 34/100  soft-tissue]
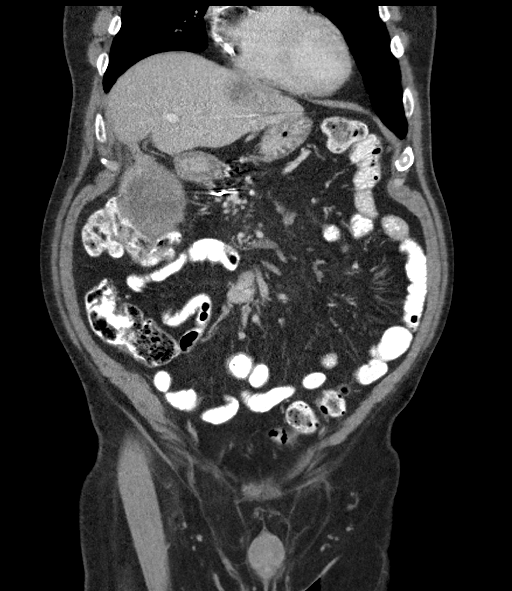
[im 45/100  soft-tissue]
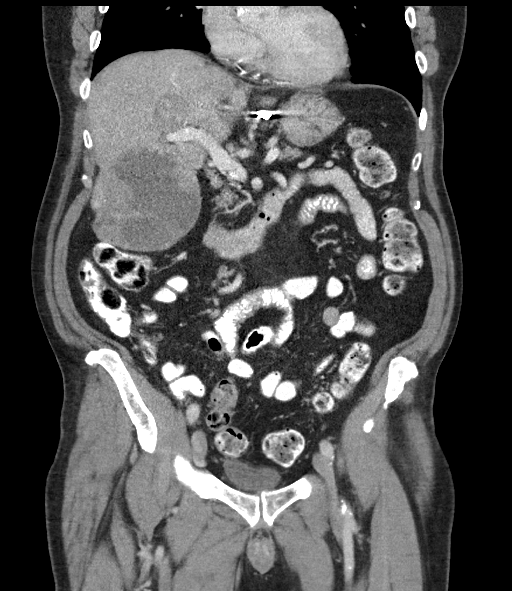
[im 56/100  soft-tissue]
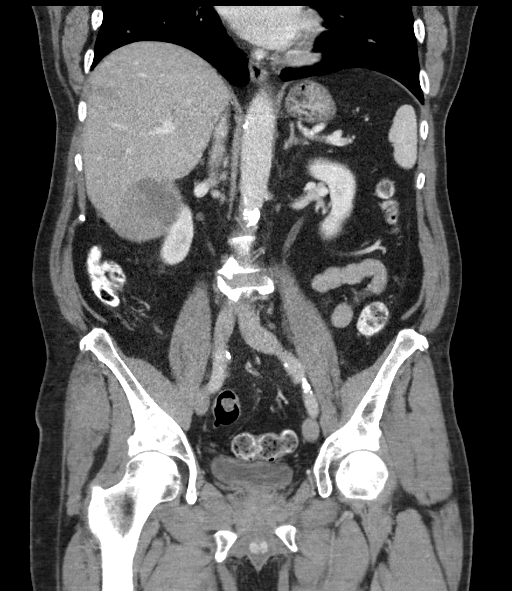

[16 of 46 positions shown; findings below may reference images not displayed]

FINDINGS: Lower chest: Lung bases are clear.

Hepatobiliary: Multiple round mixed density lesions with enhancement
are present LEFT and RIGHT hepatic lobe. Largest lesion in the
inferior RIGHT hepatic lobe measures 9.0 x 8.2 cm compared with
x 8.1 cm for no interval change. Example lesion in the LEFT lateral
hepatic lobe measures 4.5 by 3.0 cm compare to 4.9 x 3.7 cm on
comparison exam remeasured.

Lesion in the superior RIGHT hepatic lobe centrally measuring 2.6 by
2.6 cm is increased from 1.6 by 2.0 cm.

Pancreas: Pancreas is normal. No ductal dilatation. No pancreatic
inflammation.

Spleen: Normal spleen

Adrenals/urinary tract: Adrenal glands and kidneys are normal. The
ureters and bladder normal.

Stomach/Bowel: Stomach, small bowel, appendix, and cecum are normal.
The colon and rectosigmoid colon are normal.

Within the central small bowel mesenteries 2.5 x 2.6 cm partially
calcified lesion compares with 2.6 by 2.9 cm for no significant
change.

No new mesenteric nodules.

Vascular/Lymphatic: Abdominal aorta is normal caliber with
atherosclerotic calcification. There is no retroperitoneal or
periportal lymphadenopathy. No pelvic lymphadenopathy.

Reproductive: Prostate normal

Other: No free fluid.

Musculoskeletal: No aggressive osseous lesion.
IMPRESSION: 1. Essentially stable hepatic metastasis. One lesion in the superior
RIGHT hepatic lobe is increased in size. No new hepatic lesions.
2. Stable partially calcified mass the central small bowel
mesentery.
3. No new mesenteric or peritoneal retroperitoneal lesions.

## 2017-02-24 MED ORDER — IOPAMIDOL (ISOVUE-300) INJECTION 61%
100.0000 mL | Freq: Once | INTRAVENOUS | Status: AC | PRN
  Administered 2017-02-24: 100 mL via INTRAVENOUS

## 2017-02-25 ENCOUNTER — Telehealth: Payer: Self-pay | Admitting: Family

## 2017-02-25 NOTE — Telephone Encounter (Signed)
Left message with call back number to go over CT scan results with patient.

## 2017-03-01 ENCOUNTER — Ambulatory Visit
Admission: RE | Admit: 2017-03-01 | Discharge: 2017-03-01 | Disposition: A | Discharge status: Home / Self Care | Payer: Medicare HMO | Source: Ambulatory Visit | Attending: Interventional Radiology | Admitting: Interventional Radiology

## 2017-03-01 ENCOUNTER — Other Ambulatory Visit: Payer: Self-pay | Admitting: Emergency Medicine

## 2017-03-01 DIAGNOSIS — C7A8 Other malignant neuroendocrine tumors: Secondary | ICD-10-CM | POA: Diagnosis not present

## 2017-03-01 DIAGNOSIS — C787 Secondary malignant neoplasm of liver and intrahepatic bile duct: Secondary | ICD-10-CM | POA: Diagnosis not present

## 2017-03-01 HISTORY — PX: IR RADIOLOGIST EVAL & MGMT: IMG5224

## 2017-03-01 MED ORDER — LEVOTHYROXINE SODIUM 50 MCG PO TABS: 50.0000 ug | ORAL_TABLET | Freq: Every day | ORAL | 1 refills | Status: DC

## 2017-03-01 NOTE — Progress Notes (Signed)
Chief Complaint: History of metastatic neuroendocrine carcinoma to the liver.  History of Present Illness: Brian Mays is a 71 y.o. male status post prior percutaneous thermal ablation procedures and Yttrium-90 radioembolization for metastatic neuroendocrine carcinoma to the liver. He receives Somatuline 120 mg monthly. He has been doing well and staying active. He has had some occasional mild flushing. He denies significant diarrhea or other symptoms.  Past Medical History:  Diagnosis Date  . Anemia    in past  . Colon polyps   . Diverticulosis   . Gallstones   . GERD (gastroesophageal reflux disease)   . Heart attack    mild, Spring 2017  . Heart murmur   . Hemorrhoid   . Hiatal hernia   . HTN (hypertension)   . Hypothyroidism   . Metastatic carcinoma ([place]) 2010   Dr Jonette Eva  . Pancreatitis 1998   chronic    Past Surgical History:  Procedure Laterality Date  . APPENDECTOMY  1962  . CARDIAC CATHETERIZATION N/A 02/10/2016   Procedure: Left Heart Cath and Coronary Angiography;  Surgeon: Adrian Prows, MD;  Location: [facility];  Service: Cardiovascular;  Laterality: N/A;  . CARDIAC CATHETERIZATION  02/10/2016   Procedure: Coronary/Graft Atherectomy;  Surgeon: Adrian Prows, MD;  Location: [facility];  Service: Cardiovascular;;  . CARDIAC CATHETERIZATION  02/10/2016   Procedure: Coronary Stent Intervention;  Surgeon: Adrian Prows, MD;  Location: [facility];  Service: Cardiovascular;;  . CATARACT EXTRACTION W/ INTRAOCULAR LENS  IMPLANT, BILATERAL Bilateral   . IR GENERIC HISTORICAL  12/30/2015   IR RADIOLOGIST EVAL & MGMT 12/30/2015 Aletta Edouard, MD [facility] RAD  . IR GENERIC HISTORICAL  11/09/2016   IR RADIOLOGIST EVAL & MGMT 11/09/2016 Aletta Edouard, MD [facility] INTER[facility] RAD  . LAPAROSCOPIC CHOLECYSTECTOMY  1999  . LIVER BIOPSY  2010  . RADIOACTIVE SEED IMPLANT  X 3   "to my liver"  . TUMOR EXCISION  01/2009   Carcinoil Resection   . TUMOR REMOVAL     from small intestine    Allergies: Iohexol  Medications: Prior to Admission medications   Medication Sig Start Date End Date Taking? Authorizing Provider  aspirin EC 81 MG tablet Take 81 mg by mouth every morning.   Yes Historical Provider, MD  atorvastatin (LIPITOR) 80 MG tablet Take 1 tablet (80 mg total) by mouth daily at 6 PM. Patient taking differently: Take 40 mg by mouth. Take 1/2 tablet (40 MG) by mouth daily at 6 PM. 02/11/16  Yes Adrian Prows, MD  Cholecalciferol 1000 UNITS tablet Take 1,000 Units by mouth daily.     Yes Historical Provider, MD  fluticasone (FLONASE) 50 MCG/ACT nasal spray Place 2 sprays into both nostrils daily as needed (sinuses).  01/06/15  Yes Historical Provider, MD  hydrocortisone (ANUSOL-HC) 25 MG suppository INSERT 1 SUPPOSITORY RECTALLY TWICE A DAY AS NEEDED FOR HEMORRHOIDS 02/07/15  Yes [place], NP  Lanreotide Acetate (SOMATULINE DEPOT [place]) Inject 120 mcg into the skin every 28 (twenty-eight) days. Receives at Dr Antonieta Pert office   Yes Historical Provider, MD  lansoprazole (PREVACID) 30 MG capsule Take 1 capsule (30 mg total) by mouth daily at 12 noon. 11/19/16  Yes Jessica C Copland, MD  levothyroxine (SYNTHROID, LEVOTHROID) 50 MCG tablet TAKE 1 TABLET BY MOUTH EVERY DAY 02/22/17  Yes Gay Filler Copland, MD  losartan (COZAAR) 100 MG tablet Take 1 tablet (100 mg total) by mouth at bedtime. 10/14/16  Yes Jessica C Copland,  MD  metoprolol tartrate (LOPRESSOR) 25 MG tablet Take 1 tablet (25 mg total) by mouth 2 (two) times daily. 01/28/17  Yes Volanda Napoleon, MD  nitroGLYCERIN (NITROSTAT) 0.4 MG SL tablet Place 1 tablet (0.4 mg total) under the tongue every 5 (five) minutes x 3 doses as needed for chest pain. 02/11/16  Yes Adrian Prows, MD  Omega-3 Fatty Acids (FISH OIL) 1000 MG CAPS Take 1 capsule by mouth daily. Reported on 12/30/2015   Yes Historical Provider, MD  sodium chloride (OCEAN) 0.65 % SOLN nasal spray Place 1 spray into both nostrils as needed for  congestion.    Yes Historical Provider, MD  ticagrelor (BRILINTA) 90 MG TABS tablet Take 1 tablet (90 mg total) by mouth 2 (two) times daily. 02/11/16  Yes Adrian Prows, MD  vitamin B-12 (CYANOCOBALAMIN) 1000 MCG tablet Take 1,000 mcg by mouth daily.   Yes Historical Provider, MD  diphenhydrAMINE (BENADRYL) 50 MG tablet Take 1 tablet (50 mg total) by mouth once. Take 1 hour prior to scan. 02/18/17 02/18/17  Eliezer Bottom, NP  PARoxetine (PAXIL) 10 MG tablet TAKE 1 TABLET BY MOUTH DAILY Patient not taking: Reported on 03/01/2017 02/10/17   Volanda Napoleon, MD  PARoxetine (PAXIL) 20 MG tablet Take 1 tablet (20 mg total) by mouth daily. Patient not taking: Reported on 03/01/2017 10/01/16   Volanda Napoleon, MD  predniSONE (DELTASONE) 50 MG tablet Take 50mg  at 13 hours, 7 hours, and 1 hour before scan Patient not taking: Reported on 03/01/2017 02/18/17   Eliezer Bottom, NP     Family History  Problem Relation Age of Onset  . Kidney disease Mother   . Hyperlipidemia Mother   . Hypertension Mother   . COPD Father   . Ulcerative colitis Daughter     Social History   Social History  . Marital status: Married    Spouse name: N/A  . Number of children: 1  . Years of education: N/A   Occupational History  . [place] History Main Topics  . Smoking status: Former Smoker    Packs/day: 1.50    Years: 35.00    Types: Cigarettes    Start date: 09/25/1959    Quit date: 12/21/1997  . Smokeless tobacco: Never Used  . Alcohol use 1.8 - 2.4 oz/week    3 - 4 Cans of beer per week     Comment: 4 beers weekly   . Drug use: No  . Sexual activity: Yes   Other Topics Concern  . Not on file   Social History Narrative   Regular Exercise -  NO    ECOG Status: 0 - Asymptomatic  Review of Systems: A 12 point ROS discussed and pertinent positives are indicated in the HPI above.  All other systems are negative.  Review of Systems  Constitutional: Negative.   Respiratory:  Negative.   Cardiovascular: Negative.   Gastrointestinal: Negative.   Endocrine: Negative.   Genitourinary: Negative.   Musculoskeletal: Negative.   Neurological: Negative.     Vital Signs: BP (!) 154/70 (BP Location: Left Arm, Patient Position: Sitting, Cuff Size: Normal)   Pulse (!) 59   Temp 98.1 F (36.7 C) (Oral)   Resp 15   Ht 5\' 10"  (1.778 m)   Wt 192 lb (87.1 kg)   SpO2 98%   BMI 27.55 kg/m   Physical Exam  Constitutional: He is oriented to person, place, and time. He appears well-developed and well-nourished.  No distress.  Abdominal: Soft.  Neurological: He is alert and oriented to person, place, and time.  Skin: He is not diaphoretic.  Vitals reviewed.   Imaging: Ct Abdomen Pelvis W Contrast  Result Date: 02/24/2017 CLINICAL DATA:  Metastatic neuroendocrine tumor. EXAM: CT ABDOMEN AND PELVIS WITH CONTRAST TECHNIQUE: Multidetector CT imaging of the abdomen and pelvis was performed using the standard protocol following bolus administration of intravenous contrast. CONTRAST:  114mL ISOVUE-300 IOPAMIDOL (ISOVUE-300) INJECTION 61% COMPARISON:  10/01/2016 FINDINGS: Lower chest: Lung bases are clear. Hepatobiliary: Multiple round mixed density lesions with enhancement are present LEFT and RIGHT hepatic lobe. Largest lesion in the inferior RIGHT hepatic lobe measures 9.0 x 8.2 cm compared with 9.5 x 8.1 cm for no interval change. Example lesion in the LEFT lateral hepatic lobe measures 4.5 by 3.0 cm compare to 4.9 x 3.7 cm on comparison exam remeasured. Lesion in the superior RIGHT hepatic lobe centrally measuring 2.6 by 2.6 cm is increased from 1.6 by 2.0 cm. Pancreas: Pancreas is normal. No ductal dilatation. No pancreatic inflammation. Spleen: Normal spleen Adrenals/urinary tract: Adrenal glands and kidneys are normal. The ureters and bladder normal. Stomach/Bowel: Stomach, small bowel, appendix, and cecum are normal. The colon and rectosigmoid colon are normal. Within the  central small bowel mesenteries 2.5 x 2.6 cm partially calcified lesion compares with 2.6 by 2.9 cm for no significant change. No new mesenteric nodules. Vascular/Lymphatic: Abdominal aorta is normal caliber with atherosclerotic calcification. There is no retroperitoneal or periportal lymphadenopathy. No pelvic lymphadenopathy. Reproductive: Prostate normal Other: No free fluid. Musculoskeletal: No aggressive osseous lesion. IMPRESSION: 1. Essentially stable hepatic metastasis. One lesion in the superior RIGHT hepatic lobe is increased in size. No new hepatic lesions. 2. Stable partially calcified mass the central small bowel mesentery. 3. No new mesenteric or peritoneal retroperitoneal lesions. Electronically Signed   By: Suzy Bouchard M.D.   On: 02/24/2017 09:31    Labs:  CBC:  Recent Labs  12/03/16 1032 12/31/16 1001 01/28/17 1156 02/09/17 1528  WBC 6.4 6.7 6.6 6.6  HGB 13.0 12.8* 11.7* 11.4*  HCT 41.0 40.1 36.3* 35.8*  PLT 180 180 158 151    COAGS: No results for input(s): INR, APTT in the last 8760 hours.  BMP:  Recent Labs  10/09/16 1427  12/03/16 1032 12/31/16 1002 01/28/17 1157 02/09/17 1528  NA 139  < > 147* 145 141 139  K 3.6  < > 3.8 4.5 3.7 4.2  CL 102  < > 102 100 104 101  CO2 29  < > 32 31 29 27   GLUCOSE 100*  < > 128* 212* 91 158*  BUN 11  < > 12 13 12 12   CALCIUM 8.9  < > 9.8 9.5 9.2 9.1  CREATININE 1.08  < > 0.8 1.2 0.9 1.00  GFRNONAA >60  --   --   --   --  76  GFRAA >60  --   --   --   --  88  < > = values in this interval not displayed.  LIVER FUNCTION TESTS:  Recent Labs  12/03/16 1032 12/31/16 1002 01/28/17 1157 02/09/17 1528  BILITOT 1.20 1.00 1.00 0.8  AST 36 32 27 29  ALT 23 21 16 16   ALKPHOS 76 63 60 63  PROT 8.2* 7.6 7.4 7.1  ALBUMIN 3.4 3.4 3.1* 3.5*    Assessment and Plan:  Chromogranin A level has further decreased and was 9 on 02/09/2017 compared to 14 on 01/28/2017 and 18 on  12/03/2016.  However, the recent CT shows slight  enlargement of a superior right lobe metastatic lesion in the liver. Based on my measurements, I estimate that this has grown from approximately 2 cm to 2.6 cm since 10/01/2016. The lesion was barely detectable in 2016 and not present in 2015.  Other lesions in the liver appear stable.  While the bulk of metastatic disease is stable and this clearly still represents low-grade disease, there may be some benefit in obtaining a Dotatate PET scan to determine if the enlarging right lobe lesion is hypermetabolic. This may affect decision for treatment such as microwave thermal ablation of the metastatic lesion or systemic therapy.  Electronically SignedAletta Edouard T 03/01/2017, 3:10 PM   I spent a total of 15 Minutes in face to face in clinical consultation, greater than 50% of which was counseling/coordinating care for metastatic neuroendocrine carcinoma.

## 2017-03-04 ENCOUNTER — Other Ambulatory Visit: Payer: Self-pay | Admitting: Family

## 2017-03-04 DIAGNOSIS — C7B8 Other secondary neuroendocrine tumors: Secondary | ICD-10-CM

## 2017-03-11 ENCOUNTER — Ambulatory Visit: Payer: Medicare HMO

## 2017-03-11 ENCOUNTER — Other Ambulatory Visit: Payer: Medicare HMO

## 2017-03-11 ENCOUNTER — Ambulatory Visit: Payer: Medicare HMO | Admitting: Family

## 2017-03-17 ENCOUNTER — Encounter (HOSPITAL_COMMUNITY)
Admission: RE | Admit: 2017-03-17 | Discharge: 2017-03-17 | Disposition: A | Discharge status: Home / Self Care | Payer: Medicare HMO | Source: Ambulatory Visit | Attending: Family | Admitting: Family

## 2017-03-17 DIAGNOSIS — C787 Secondary malignant neoplasm of liver and intrahepatic bile duct: Secondary | ICD-10-CM | POA: Insufficient documentation

## 2017-03-17 DIAGNOSIS — C7B8 Other secondary neuroendocrine tumors: Secondary | ICD-10-CM

## 2017-03-17 DIAGNOSIS — C7A8 Other malignant neuroendocrine tumors: Secondary | ICD-10-CM | POA: Insufficient documentation

## 2017-03-17 DIAGNOSIS — C7A1 Malignant poorly differentiated neuroendocrine tumors: Secondary | ICD-10-CM | POA: Diagnosis not present

## 2017-03-17 IMAGING — CT NM PET NOPR SKULL BASE TO THIGH
9 series · 25 of 25 positions shown · non-contrast
Comparison: CT [DATE], PET-CT [DATE]

CLINICAL DATA: Neuroendocrine tumor. Metastatic low-grade
neruoendocrine tumor with hepatic metastasis.

EXAM:
NUCLEAR MEDICINE PET SKULL BASE TO THIGH
TECHNIQUE: 4.8 mCi Ga 68 DOTATATE was injected intravenously. Full-ring PET
imaging was performed from the skull base to thigh after the
radiotracer. CT data was obtained and used for attenuation
correction and anatomic localization.

[Series 3: pet sk_thigh ac · axial · 5.0mm · 4.07mm/px · z∈[-558,+378]mm · 4 of 235 slices shown]
[im 1/235]
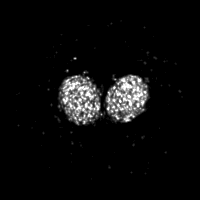
[im 79/235]
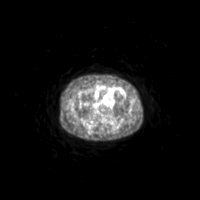
[im 157/235]
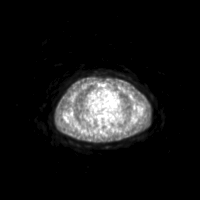
[im 235/235]
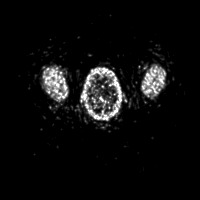

[Series 4: ct sk_thigh 5.0 hd_fov · axial · 5.0mm · 1.07mm/px · z∈[-558,+378]mm · 5 of 235 slices shown]
[im 1/235]
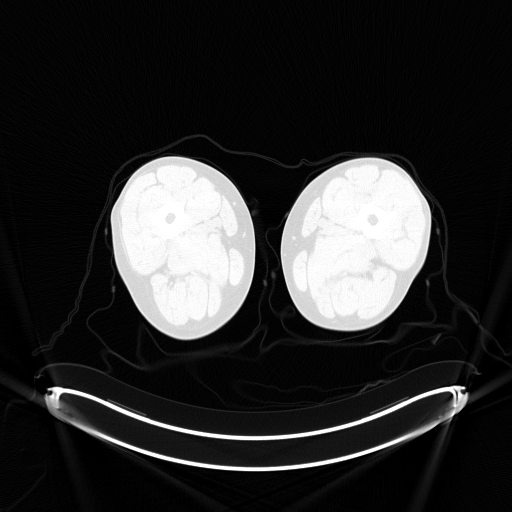
[im 59/235]
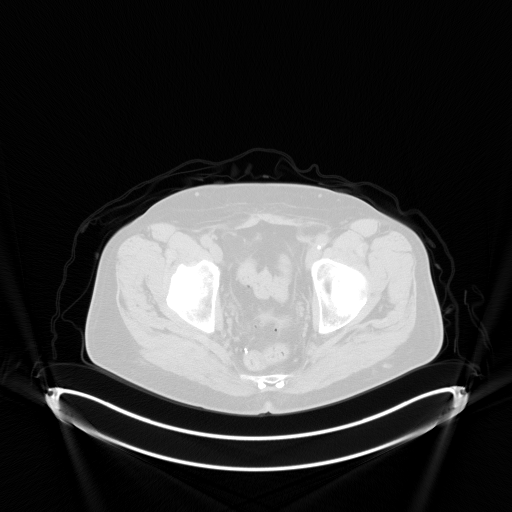
[im 118/235]
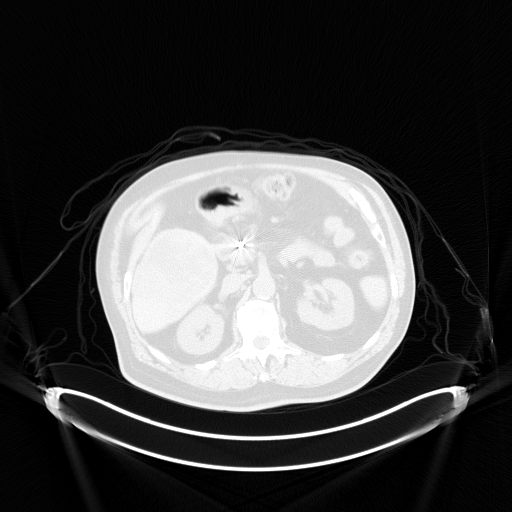
[im 176/235]
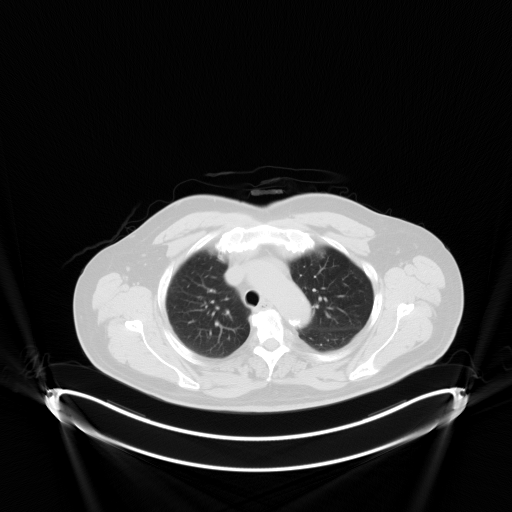
[im 235/235  brain]
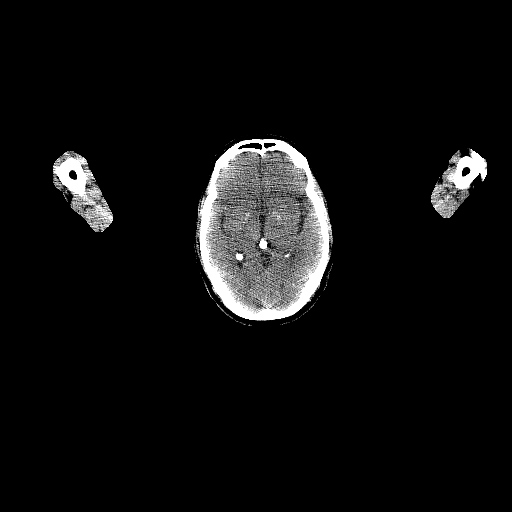

[Series 7: pet sk_thigh nac · axial · 5.0mm · 4.07mm/px · z∈[-558,+378]mm · 5 of 235 slices shown]
[im 1/235]
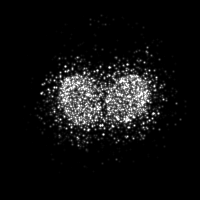
[im 59/235]
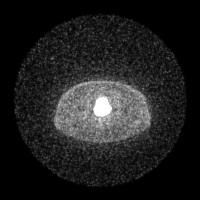
[im 118/235]
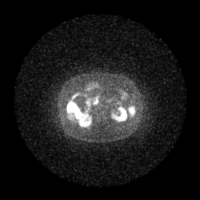
[im 176/235]
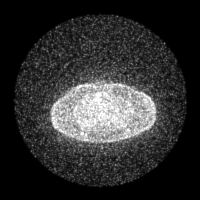
[im 235/235]
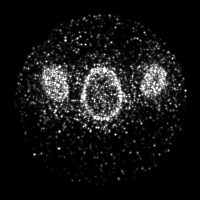

[Series 8: ct sk_thigh 5.0 (id) lung_bone · axial · 5.0mm · 0.66mm/px · 1 of 60 slices shown]
[im 1/60  bone]
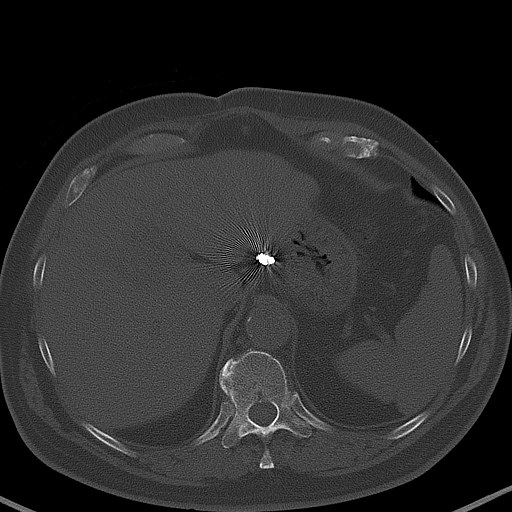

[Series 603: range-ct sk_thigh 5.0 hd_fov-cor-<alpha range> · 2 of 69 slices shown]
[im 1/69]
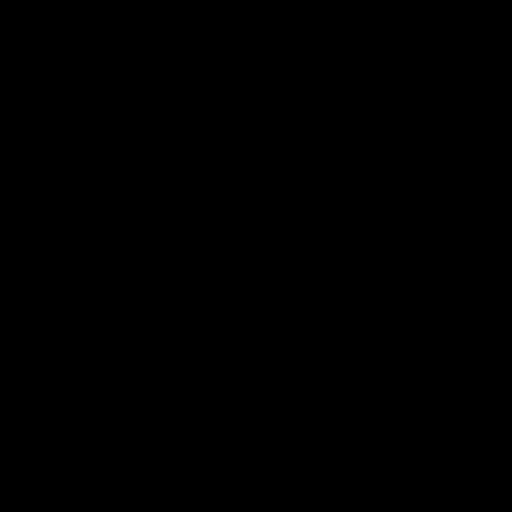
[im 69/69]
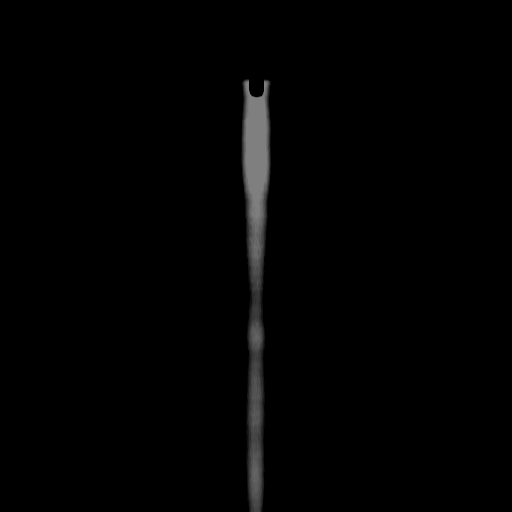

[Series 604: mip collection · coronal · 1.94mm/px · 1 of 32 slices shown]
[im 1/32]
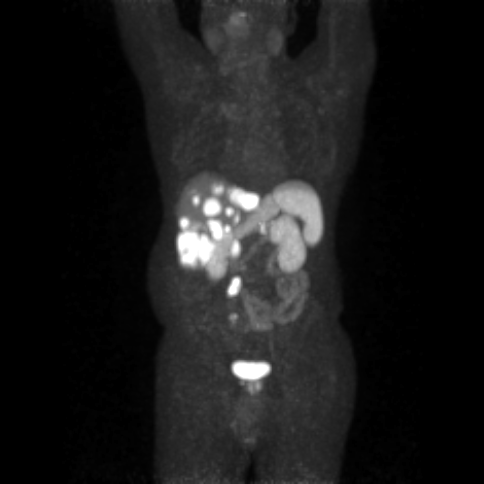

[Series 605: range-ct sk_thigh 5.0 hd_fov-tra-<alpha range> · 5 of 215 slices shown]
[im 1/215]
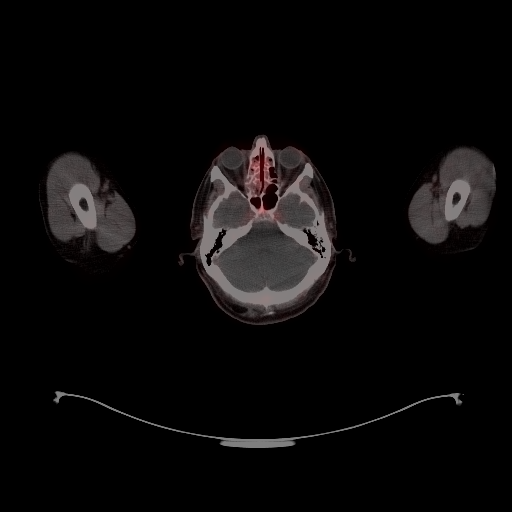
[im 54/215]
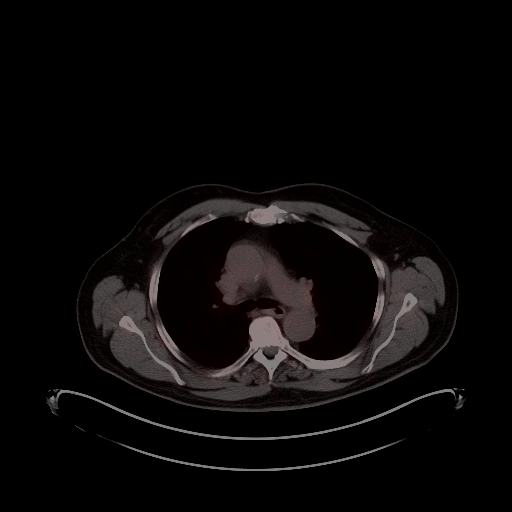
[im 108/215]
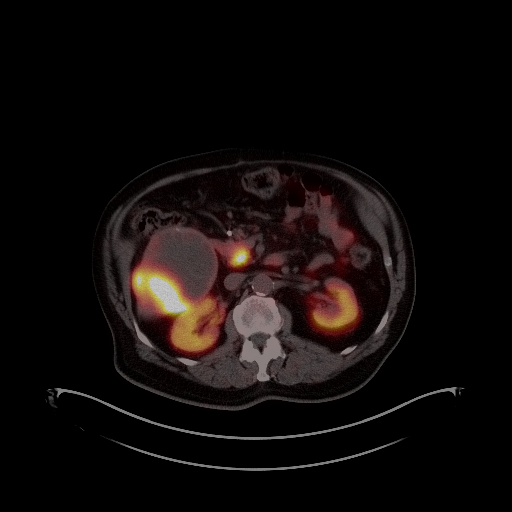
[im 161/215]
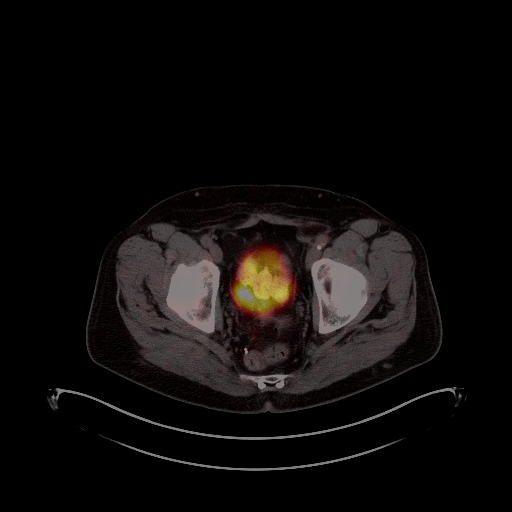
[im 215/215]
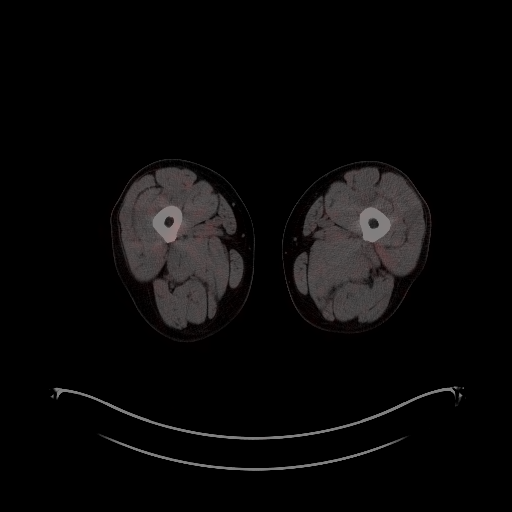

[Series 1092: results mm oncology reading · 0.76mm/px · 1 of 3 slices shown (1 of 2)]
[im 1/3]
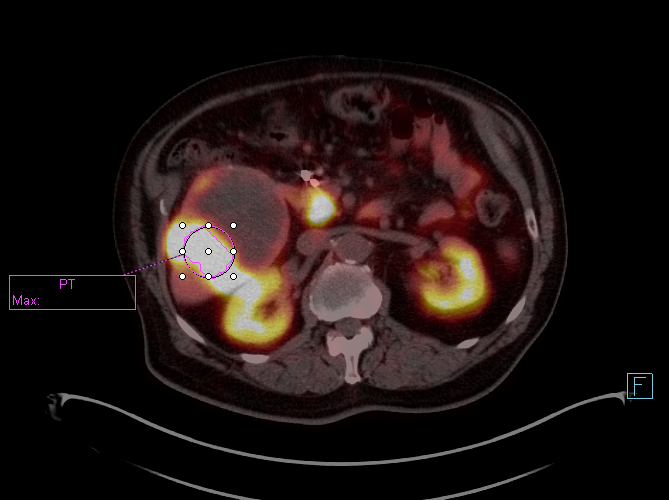

[Series 1171: results mm oncology reading · 1.2mm · 1.19mm/px · 1 of 8 slices shown (2 of 2)]
[im 1/8]
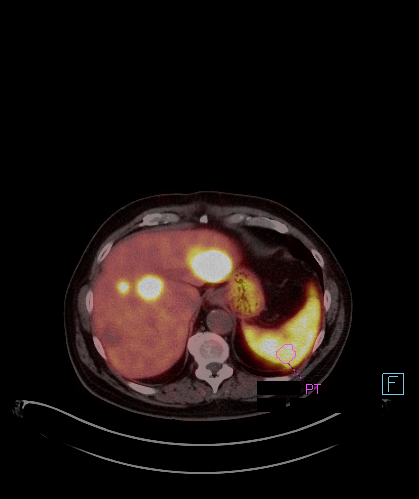

[25 of 25 positions shown; findings below may reference images not displayed]

FINDINGS: NECK

No radiotracer activity in neck lymph nodes.

CHEST

No radiotracer accumulation within mediastinal or hilar lymph nodes.
No suspicious pulmonary nodules on the CT scan.

ABDOMEN/PELVIS

Multiple lesions in the liver have intense radiotracer accumulation.
The largest lesion in the inferior aspect the RIGHT hepatic lobe
measuring 9.4 x 8.5 cm has intense peripheral activity with SUV max
equal

The second largest lesion is in the LEFT lateral hepatic lobe
measuring 5 cm with SUV max 52. There are 9 additional smaller
lesions within the LEFT RIGHT hepatic lobe.

Hypermetabolic activity associated with the uncinate of the pancreas
is typical physiologic location. However there is a small porta
hepatis lymph node adjacent to the uncinate which appears to
accumulate radiotracer (image 116, series 4).

Intense activity associated with the partially calcified central
mesenteric mass with SUV max equal 46.

No additional metastasis in the abdomen or pelvis.

Physiologic activity noted in the liver, spleen, adrenal glands and
kidneys.

SKELETON

No skeletal metastasis per
IMPRESSION: 1. Multiple sites of intense radiotracer accumulation within the
LEFT and RIGHT hepatic lobe consistent with well differentiated
neuroendocrine tumor.
2. Intense metabolic activity associated with the central mesenteric
partially calcified mass consistent with neuroendocrine tumor.
3. Activity in the uncinate of the pancreas is favored physiologic.
Probable adjacent metastatic node in the porta hepatis.

## 2017-03-17 MED ORDER — GALLIUM GA 68 DOTATATE IV KIT
4.8000 | PACK | Freq: Once | INTRAVENOUS | Status: DC
Start: 2017-03-17 — End: 2017-03-23

## 2017-03-18 ENCOUNTER — Other Ambulatory Visit (HOSPITAL_BASED_OUTPATIENT_CLINIC_OR_DEPARTMENT_OTHER): Payer: Medicare HMO

## 2017-03-18 ENCOUNTER — Ambulatory Visit (HOSPITAL_BASED_OUTPATIENT_CLINIC_OR_DEPARTMENT_OTHER): Payer: Medicare HMO

## 2017-03-18 ENCOUNTER — Ambulatory Visit (HOSPITAL_BASED_OUTPATIENT_CLINIC_OR_DEPARTMENT_OTHER): Payer: Medicare HMO | Admitting: Family

## 2017-03-18 VITALS — BP 173/75 | HR 59 | Temp 98.0°F | Resp 16 | Wt 200.8 lb

## 2017-03-18 DIAGNOSIS — C7A8 Other malignant neuroendocrine tumors: Secondary | ICD-10-CM

## 2017-03-18 DIAGNOSIS — C7B8 Other secondary neuroendocrine tumors: Secondary | ICD-10-CM

## 2017-03-18 LAB — CMP (CANCER CENTER ONLY)
ALBUMIN: 3.2 g/dL — AB (ref 3.3–5.5)
ALT(SGPT): 22 U/L (ref 10–47)
AST: 29 U/L (ref 11–38)
Alkaline Phosphatase: 67 U/L (ref 26–84)
BUN, Bld: 11 mg/dL (ref 7–22)
CHLORIDE: 104 meq/L (ref 98–108)
CO2: 29 meq/L (ref 18–33)
CREATININE: 1 mg/dL (ref 0.6–1.2)
Calcium: 9.1 mg/dL (ref 8.0–10.3)
Glucose, Bld: 125 mg/dL — ABNORMAL HIGH (ref 73–118)
Potassium: 4.3 mEq/L (ref 3.3–4.7)
SODIUM: 142 meq/L (ref 128–145)
Total Bilirubin: 0.9 mg/dl (ref 0.20–1.60)
Total Protein: 7.4 g/dL (ref 6.4–8.1)

## 2017-03-18 LAB — CBC WITH DIFFERENTIAL (CANCER CENTER ONLY)
BASO#: 0 10*3/uL (ref 0.0–0.2)
BASO%: 0.4 % (ref 0.0–2.0)
EOS ABS: 0.4 10*3/uL (ref 0.0–0.5)
EOS%: 5.1 % (ref 0.0–7.0)
HCT: 37.3 % — ABNORMAL LOW (ref 38.7–49.9)
HEMOGLOBIN: 12 g/dL — AB (ref 13.0–17.1)
LYMPH#: 1.2 10*3/uL (ref 0.9–3.3)
LYMPH%: 17.4 % (ref 14.0–48.0)
MCH: 27.8 pg — AB (ref 28.0–33.4)
MCHC: 32.2 g/dL (ref 32.0–35.9)
MCV: 86 fL (ref 82–98)
MONO#: 0.6 10*3/uL (ref 0.1–0.9)
MONO%: 8.4 % (ref 0.0–13.0)
NEUT%: 68.7 % (ref 40.0–80.0)
NEUTROS ABS: 4.7 10*3/uL (ref 1.5–6.5)
Platelets: 175 10*3/uL (ref 145–400)
RBC: 4.32 10*6/uL (ref 4.20–5.70)
RDW: 15.1 % (ref 11.1–15.7)
WBC: 6.9 10*3/uL (ref 4.0–10.0)

## 2017-03-18 MED ORDER — LANREOTIDE ACETATE 120 MG/0.5ML ~~LOC~~ SOLN
SUBCUTANEOUS | Status: AC
  Filled 2017-03-18: qty 120

## 2017-03-18 MED ORDER — LANREOTIDE ACETATE 120 MG/0.5ML ~~LOC~~ SOLN
120.0000 mg | Freq: Once | SUBCUTANEOUS | Status: AC
  Administered 2017-03-18: 120 mg via SUBCUTANEOUS

## 2017-03-18 NOTE — Patient Instructions (Signed)
Lanreotide injection What is this medicine? LANREOTIDE (lan REE oh tide) is used to reduce blood levels of growth hormone in patients with a condition called acromegaly. It also works to slow or stop tumor growth in patients with neuroendocrine tumors and treat carcinoid syndrome. This medicine may be used for other purposes; ask your health care provider or pharmacist if you have questions. COMMON BRAND NAME(S): Somatuline [place] What should I tell my health care provider before I take this medicine? They need to know if you have any of these conditions: -diabetes -gallbladder disease -heart disease -kidney disease -liver disease -thyroid disease -an unusual or allergic reaction to lanreotide, other medicines, foods, dyes, or preservatives -pregnant or trying to get pregnant -breast-feeding How should I use this medicine? This medicine is for injection under the skin. It is given by a health care professional in a hospital or clinic setting. Contact your pediatrician or health care professional regarding the use of this medicine in children. Special care may be needed. Overdosage: If you think you have taken too much of this medicine contact a poison control center or emergency room at once. NOTE: This medicine is only for you. Do not share this medicine with others. What if I miss a dose? It is important not to miss your dose. Call your doctor or health care professional if you are unable to keep an appointment. What may interact with this medicine? This medicine may interact with the following medications: -bromocriptine -cyclosporine -certain medicines for blood pressure, heart disease, irregular heart beat -certain medicines for diabetes -quinidine -terfenadine This list may not describe all possible interactions. Give your health care provider a list of all the medicines, herbs, non-prescription drugs, or dietary supplements you use. Also tell them if you smoke, drink alcohol, or  use illegal drugs. Some items may interact with your medicine. What should I watch for while using this medicine? Tell your doctor or healthcare professional if your symptoms do not start to get better or if they get worse. Visit your doctor or health care professional for regular checks on your progress. Your condition will be monitored carefully while you are receiving this medicine. You may need blood work done while you are taking this medicine. Women should inform their doctor if they wish to become pregnant or think they might be pregnant. There is a potential for serious side effects to an unborn child. Talk to your health care professional or pharmacist for more information. Do not breast-feed an infant while taking this medicine or for 6 months after stopping it. This medicine has caused ovarian failure in some women. This medicine may interfere with the ability to have a child. Talk with your doctor or health care professional if you are concerned about your fertility. What side effects may I notice from receiving this medicine? Side effects that you should report to your doctor or health care professional as soon as possible: -allergic reactions like skin rash, itching or hives, swelling of the face, lips, or tongue -increased blood pressure -severe stomach pain -signs and symptoms of high blood sugar such as dizziness; dry mouth; dry skin; fruity breath; nausea; stomach pain; increased hunger or thirst; increased urination -signs and symptoms of low blood sugar such as feeling anxious; confusion; dizziness; increased hunger; unusually weak or tired; sweating; shakiness; cold; irritable; headache; blurred vision; fast heartbeat; loss of consciousness -unusually slow heartbeat Side effects that usually do not require medical attention (report to your doctor or health care professional if they continue   or are bothersome): -constipation -diarrhea -dizziness -headache -muscle pain -muscle  spasms -nausea -pain, redness, or irritation at site where injected This list may not describe all possible side effects. Call your doctor for medical advice about side effects. You may report side effects to FDA at 1-800-FDA-1088. Where should I keep my medicine? This drug is given in a hospital or clinic and will not be stored at home. NOTE: This sheet is a summary. It may not cover all possible information. If you have questions about this medicine, talk to your doctor, pharmacist, or health care provider.  2018 Elsevier/Gold Standard (2016-07-23 10:33:47)  

## 2017-03-18 NOTE — Progress Notes (Signed)
Hematology and Oncology Follow Up Visit  Brian Mays [id number] January 04, 1946 71 y.o. 03/18/2017   Principle Diagnosis:  Metastatic low grade neuroendocrine tumor-hepatic metastases  Current Therapy:   S/p third yttrium-90 intrahepatic therapy - November 2016 Somatuline 120 mg monthly   Interim History:  Brian Mays is here today with his daughter for follow-up. He had a Dotatate PET scan yesterday. We went over this in detail and looked at the images comparing them to the CT scan he had in April.   Scan showed multiple sites of intense radiotracer accumulation within the left and right hepatic lobe consistent with well differentiated neuroendocrine tumor. There was intense metabolic activity associated with the central mesenteric partially calcified mass consistent with neuroendocrine tumor. There was activity in the uncinate of the pancreas is favored physiologic. There was probable adjacent metastatic node in the porta hepatis. I have routed this scan to Dr. Kathlene Cote.  He is having some bloating in his abdomen but denies pain. Chromogranin A in April was 9. Level today is pending. LFT's are stable.  No fever, chills, n/v, cough, rash, dizziness, SOB, chest pain, palpitations, abdominal pain or changes in bowel or bladder habits.  He has no swelling, tenderness, numbness or tingling in his extremities. No c/o pain at this time.  He has maintained a good appetite and is staying well hydrated. His weight is stable.  ECOG Performance Status: 1 - Symptomatic but completely ambulatory  Medications:  Allergies as of 03/18/2017      Reactions   Iohexol Hives    Code: HIVES, Desc: PER [name] @ PRIMARY CARE, PT IS ALLERGIC TO CONTRAST DYE 10/02/08/RM  05/01/10...needs full premeds per our protocol w/ [facility]., Onset Date: [id number]      Medication List       Accurate as of 03/18/17 12:47 PM. Always use your most recent med list.          aspirin EC 81 MG tablet Take 81 mg by mouth every  morning.   atorvastatin 80 MG tablet Commonly known as:  LIPITOR Take 1 tablet (80 mg total) by mouth daily at 6 PM.   Cholecalciferol 1000 units tablet Take 1,000 Units by mouth daily.   diphenhydrAMINE 50 MG tablet Commonly known as:  BENADRYL Take 1 tablet (50 mg total) by mouth once. Take 1 hour prior to scan.   Fish Oil 1000 MG Caps Take 1 capsule by mouth daily. Reported on 12/30/2015   fluticasone 50 MCG/ACT nasal spray Commonly known as:  FLONASE Place 2 sprays into both nostrils daily as needed (sinuses).   hydrocortisone 25 MG suppository Commonly known as:  ANUSOL-HC INSERT 1 SUPPOSITORY RECTALLY TWICE A DAY AS NEEDED FOR HEMORRHOIDS   lansoprazole 30 MG capsule Commonly known as:  PREVACID Take 1 capsule (30 mg total) by mouth daily at 12 noon.   levothyroxine 50 MCG tablet Commonly known as:  SYNTHROID, LEVOTHROID Take 1 tablet (50 mcg total) by mouth daily.   losartan 100 MG tablet Commonly known as:  COZAAR Take 1 tablet (100 mg total) by mouth at bedtime.   metoprolol tartrate 25 MG tablet Commonly known as:  LOPRESSOR Take 1 tablet (25 mg total) by mouth 2 (two) times daily.   nitroGLYCERIN 0.4 MG SL tablet Commonly known as:  NITROSTAT Place 1 tablet (0.4 mg total) under the tongue every 5 (five) minutes x 3 doses as needed for chest pain.   PARoxetine 20 MG tablet Commonly known as:  PAXIL Take 1 tablet (20  mg total) by mouth daily.   PARoxetine 10 MG tablet Commonly known as:  PAXIL TAKE 1 TABLET BY MOUTH DAILY   predniSONE 50 MG tablet Commonly known as:  DELTASONE Take 50mg  at 13 hours, 7 hours, and 1 hour before scan   sodium chloride 0.65 % Soln nasal spray Commonly known as:  OCEAN Place 1 spray into both nostrils as needed for congestion.   SOMATULINE DEPOT [place] Inject 120 mcg into the skin every 28 (twenty-eight) days. Receives at Dr Antonieta Pert office   ticagrelor 90 MG Tabs tablet Commonly known as:  BRILINTA Take 1 tablet (90  mg total) by mouth 2 (two) times daily.   vitamin B-12 1000 MCG tablet Commonly known as:  CYANOCOBALAMIN Take 1,000 mcg by mouth daily.       Allergies:  Allergies  Allergen Reactions  . Iohexol Hives     Code: HIVES, Desc: PER [name] @ PRIMARY CARE, PT IS ALLERGIC TO CONTRAST DYE 10/02/08/RM  05/01/10...needs full premeds per our protocol w/ [facility]., Onset Date: [id number]     Past Medical History, Surgical history, Social history, and Family History were reviewed and updated.  Review of Systems: All other 10 point review of systems is negative.   Physical Exam:  weight is 200 lb 12.8 oz (91.1 kg). His oral temperature is 98 F (36.7 C). His blood pressure is 173/75 (abnormal) and his pulse is 59 (abnormal). His respiration is 16 and oxygen saturation is 100%.   Wt Readings from Last 3 Encounters:  03/18/17 200 lb 12.8 oz (91.1 kg)  03/01/17 192 lb (87.1 kg)  02/09/17 196 lb 6.4 oz (89.1 kg)    Ocular: Sclerae unicteric, pupils equal, round and reactive to light Ear-nose-throat: Oropharynx clear, dentition fair Lymphatic: No cervical, supraclavicular or axillary adenopathy Lungs no rales or rhonchi, good excursion bilaterally Heart regular rate and rhythm, no murmur appreciated Abd soft, nontender, positive bowel sounds, no liver or spleen tip palpated on exam, no fluid wave  MSK no focal spinal tenderness, no joint edema Neuro: non-focal, well-oriented, appropriate affect Breasts: Deferred   Lab Results  Component Value Date   WBC 6.9 03/18/2017   HGB 12.0 (L) 03/18/2017   HCT 37.3 (L) 03/18/2017   MCV 86 03/18/2017   PLT 175 03/18/2017   No results found for: FERRITIN, IRON, TIBC, UIBC, IRONPCTSAT Lab Results  Component Value Date   RBC 4.32 03/18/2017   No results found for: KPAFRELGTCHN, LAMBDASER, KAPLAMBRATIO No results found for: IGGSERUM, IGA, IGMSERUM No results found for: Odetta Pink, SPEI    Chemistry      Component Value Date/Time   NA 142 03/18/2017 1133   NA 141 07/23/2016 1255   K 4.3 03/18/2017 1133   K 4.1 07/23/2016 1255   CL 104 03/18/2017 1133   CO2 29 03/18/2017 1133   CO2 27 07/23/2016 1255   BUN 11 03/18/2017 1133   BUN 13.8 07/23/2016 1255   CREATININE 1.0 03/18/2017 1133   CREATININE 1.0 07/23/2016 1255      Component Value Date/Time   CALCIUM 9.1 03/18/2017 1133   CALCIUM 9.2 07/23/2016 1255   ALKPHOS 67 03/18/2017 1133   ALKPHOS 77 07/23/2016 1255   AST 29 03/18/2017 1133   AST 23 07/23/2016 1255   ALT 22 03/18/2017 1133   ALT 13 07/23/2016 1255   BILITOT 0.90 03/18/2017 1133   BILITOT 1.13 07/23/2016 1255      Impression and Plan: Brian Mays is  a very pleasant 71 yo caucasian gentleman with metastatic neuroendocrine carcinoma with lever mets. He had a the Dotatate PET scan yesterday which showed disease in the liver, mesentery and probable adjacent metastatic node in the porta hepatis.  His chromogranin A had come down to 9 in April. Today's level is pending.  I have routed his scan results to Dr. Kathlene Cote and Dr. Marin Olp for further review.  We will proceed with Somatuline injection today as planned.  We will plan to see him back in 1 month for repeat lab work and follow-up.  He will contact our office with any questions or concerns. We can certainly see him sooner if need be.   Eliezer Bottom, NP 5/18/201812:47 PM

## 2017-03-21 LAB — CHROMOGRANIN A: Chromogranin A: 13 nmol/L — ABNORMAL HIGH (ref 0–5)

## 2017-03-22 ENCOUNTER — Telehealth: Payer: Self-pay | Admitting: Family

## 2017-03-22 NOTE — Telephone Encounter (Signed)
Let Mr. Mullarkey know that Dr. Marin Olp will see him back in 3-4 weeks for follow-up and discuss changing treatment to [place] at that time. All questions were answered and he is in agreement with a plan.

## 2017-04-04 DIAGNOSIS — E7800 Pure hypercholesterolemia, unspecified: Secondary | ICD-10-CM | POA: Diagnosis not present

## 2017-04-04 DIAGNOSIS — I25119 Atherosclerotic heart disease of native coronary artery with unspecified angina pectoris: Secondary | ICD-10-CM | POA: Diagnosis not present

## 2017-04-08 DIAGNOSIS — I25119 Atherosclerotic heart disease of native coronary artery with unspecified angina pectoris: Secondary | ICD-10-CM | POA: Diagnosis not present

## 2017-04-08 DIAGNOSIS — I10 Essential (primary) hypertension: Secondary | ICD-10-CM | POA: Diagnosis not present

## 2017-04-08 DIAGNOSIS — I209 Angina pectoris, unspecified: Secondary | ICD-10-CM | POA: Diagnosis not present

## 2017-04-08 DIAGNOSIS — E7800 Pure hypercholesterolemia, unspecified: Secondary | ICD-10-CM | POA: Diagnosis not present

## 2017-04-14 ENCOUNTER — Ambulatory Visit (HOSPITAL_BASED_OUTPATIENT_CLINIC_OR_DEPARTMENT_OTHER): Payer: Medicare HMO | Admitting: Hematology & Oncology

## 2017-04-14 ENCOUNTER — Ambulatory Visit (HOSPITAL_BASED_OUTPATIENT_CLINIC_OR_DEPARTMENT_OTHER): Payer: Medicare HMO

## 2017-04-14 ENCOUNTER — Other Ambulatory Visit (HOSPITAL_BASED_OUTPATIENT_CLINIC_OR_DEPARTMENT_OTHER): Payer: Medicare HMO

## 2017-04-14 VITALS — BP 140/62 | HR 67 | Temp 98.4°F | Resp 18 | Wt 203.0 lb

## 2017-04-14 DIAGNOSIS — C7B8 Other secondary neuroendocrine tumors: Secondary | ICD-10-CM | POA: Diagnosis not present

## 2017-04-14 DIAGNOSIS — C7A8 Other malignant neuroendocrine tumors: Secondary | ICD-10-CM | POA: Diagnosis not present

## 2017-04-14 LAB — CMP (CANCER CENTER ONLY)
ALBUMIN: 3 g/dL — AB (ref 3.3–5.5)
ALT(SGPT): 22 U/L (ref 10–47)
AST: 27 U/L (ref 11–38)
Alkaline Phosphatase: 61 U/L (ref 26–84)
BILIRUBIN TOTAL: 0.9 mg/dL (ref 0.20–1.60)
BUN, Bld: 12 mg/dL (ref 7–22)
CO2: 27 mEq/L (ref 18–33)
CREATININE: 0.7 mg/dL (ref 0.6–1.2)
Calcium: 9.1 mg/dL (ref 8.0–10.3)
Chloride: 106 mEq/L (ref 98–108)
Glucose, Bld: 151 mg/dL — ABNORMAL HIGH (ref 73–118)
Potassium: 3.8 mEq/L (ref 3.3–4.7)
SODIUM: 140 meq/L (ref 128–145)
TOTAL PROTEIN: 6.9 g/dL (ref 6.4–8.1)

## 2017-04-14 LAB — CBC WITH DIFFERENTIAL (CANCER CENTER ONLY)
BASO#: 0 10*3/uL (ref 0.0–0.2)
BASO%: 0.5 % (ref 0.0–2.0)
EOS ABS: 0.5 10*3/uL (ref 0.0–0.5)
EOS%: 7.1 % — ABNORMAL HIGH (ref 0.0–7.0)
HCT: 37.2 % — ABNORMAL LOW (ref 38.7–49.9)
HEMOGLOBIN: 12 g/dL — AB (ref 13.0–17.1)
LYMPH#: 1 10*3/uL (ref 0.9–3.3)
LYMPH%: 15.1 % (ref 14.0–48.0)
MCH: 27.7 pg — AB (ref 28.0–33.4)
MCHC: 32.3 g/dL (ref 32.0–35.9)
MCV: 86 fL (ref 82–98)
MONO#: 0.3 10*3/uL (ref 0.1–0.9)
MONO%: 4.4 % (ref 0.0–13.0)
NEUT%: 72.9 % (ref 40.0–80.0)
NEUTROS ABS: 4.6 10*3/uL (ref 1.5–6.5)
Platelets: 141 10*3/uL — ABNORMAL LOW (ref 145–400)
RBC: 4.33 10*6/uL (ref 4.20–5.70)
RDW: 15 % (ref 11.1–15.7)
WBC: 6.4 10*3/uL (ref 4.0–10.0)

## 2017-04-14 MED ORDER — LANREOTIDE ACETATE 120 MG/0.5ML ~~LOC~~ SOLN
120.0000 mg | Freq: Once | SUBCUTANEOUS | Status: AC
  Administered 2017-04-14: 120 mg via SUBCUTANEOUS

## 2017-04-14 MED ORDER — LANREOTIDE ACETATE 120 MG/0.5ML ~~LOC~~ SOLN
SUBCUTANEOUS | Status: AC
  Filled 2017-04-14: qty 120

## 2017-04-14 NOTE — Addendum Note (Signed)
Addended by: Burney Gauze R on: 04/14/2017 09:03 AM   Modules accepted: Orders

## 2017-04-14 NOTE — Progress Notes (Signed)
Hematology and Oncology Follow Up Visit  Brian Mays [id number] Jan 27, 1946 71 y.o. 04/14/2017   Principle Diagnosis:  Metastatic low grade neuroendocrine tumor-hepatic metastases  Current Therapy:   S/p third yttrium-90 intrahepatic therapy - November 2016 Somatuline 120 mg monthly    Interim History: Brian Mays is here today for a follow-up. He is doing pretty well. He has had no specific complaints. He did have the special Dotatate PET scan. This was on May 18. This did show multiple areas of uptake.   As such, I think that we need to consider the new radioisotope therapy-Lutathera. I did this would be a very good choice for him. This was proven to be very effective in the NETTER-1 randomized clinical trial.  I will have to see who actually does this.  I spoke to him his wife about this. He is definitely willing to try. I think the side effect profile is very minimal.  His last chromogranin A level was 21.  He's had no nausea or vomiting. He's had no rashes present no leg swelling. He's had no diarrhea.  Overall, his performance status is ECOG 0.  Medications:  Allergies as of 04/14/2017      Reactions   Iohexol Hives    Code: HIVES, Desc: PER [name] @ PRIMARY CARE, PT IS ALLERGIC TO CONTRAST DYE 10/02/08/RM  05/01/10...needs full premeds per our protocol w/ [facility]., Onset Date: [id number]      Medication List       Accurate as of 04/14/17  8:12 AM. Always use your most recent med list.          amLODipine 5 MG tablet Commonly known as:  NORVASC Take 5 mg by mouth daily.   aspirin EC 81 MG tablet Take 81 mg by mouth every morning.   atorvastatin 80 MG tablet Commonly known as:  LIPITOR Take 1 tablet (80 mg total) by mouth daily at 6 PM.   Cholecalciferol 1000 units tablet Take 1,000 Units by mouth daily.   diphenhydrAMINE 50 MG tablet Commonly known as:  BENADRYL Take 1 tablet (50 mg total) by mouth once. Take 1 hour prior to scan.   Fish Oil 1000 MG  Caps Take 1 capsule by mouth daily. Reported on 12/30/2015   fluticasone 50 MCG/ACT nasal spray Commonly known as:  FLONASE Place 2 sprays into both nostrils daily as needed (sinuses).   hydrocortisone 25 MG suppository Commonly known as:  ANUSOL-HC INSERT 1 SUPPOSITORY RECTALLY TWICE A DAY AS NEEDED FOR HEMORRHOIDS   lansoprazole 30 MG capsule Commonly known as:  PREVACID Take 1 capsule (30 mg total) by mouth daily at 12 noon.   levothyroxine 50 MCG tablet Commonly known as:  SYNTHROID, LEVOTHROID Take 1 tablet (50 mcg total) by mouth daily.   losartan 100 MG tablet Commonly known as:  COZAAR Take 1 tablet (100 mg total) by mouth at bedtime.   metoprolol tartrate 25 MG tablet Commonly known as:  LOPRESSOR Take 1 tablet (25 mg total) by mouth 2 (two) times daily.   nitroGLYCERIN 0.4 MG SL tablet Commonly known as:  NITROSTAT Place 1 tablet (0.4 mg total) under the tongue every 5 (five) minutes x 3 doses as needed for chest pain.   PARoxetine 20 MG tablet Commonly known as:  PAXIL Take 1 tablet (20 mg total) by mouth daily.   PARoxetine 10 MG tablet Commonly known as:  PAXIL TAKE 1 TABLET BY MOUTH DAILY   predniSONE 50 MG tablet Commonly known as:  DELTASONE  Take 50mg  at 13 hours, 7 hours, and 1 hour before scan   sodium chloride 0.65 % Soln nasal spray Commonly known as:  OCEAN Place 1 spray into both nostrils as needed for congestion.   SOMATULINE DEPOT [place] Inject 120 mcg into the skin every 28 (twenty-eight) days. Receives at Dr Antonieta Pert office   ticagrelor 90 MG Tabs tablet Commonly known as:  BRILINTA Take 1 tablet (90 mg total) by mouth 2 (two) times daily.   vitamin B-12 1000 MCG tablet Commonly known as:  CYANOCOBALAMIN Take 1,000 mcg by mouth daily.       Allergies:  Allergies  Allergen Reactions  . Iohexol Hives     Code: HIVES, Desc: PER [name] @ PRIMARY CARE, PT IS ALLERGIC TO CONTRAST DYE 10/02/08/RM  05/01/10...needs full premeds per our  protocol w/ [facility]., Onset Date: [id number]     Past Medical History, Surgical history, Social history, and Family History were reviewed and updated.  Review of Systems: All other 10 point review of systems is negative.   Physical Exam:  weight is 203 lb (92.1 kg). His oral temperature is 98.4 F (36.9 C). His blood pressure is 140/62 and his pulse is 67. His respiration is 18 and oxygen saturation is 100%.   Wt Readings from Last 3 Encounters:  04/14/17 203 lb (92.1 kg)  03/18/17 200 lb 12.8 oz (91.1 kg)  03/01/17 192 lb (87.1 kg)    Well-developed and well-nourished white male in no obvious distress. Head and neck exam shows no ocular or oral lesions. He has no obvious facial flushing. He has no adenopathy in the neck. Lungs are clear bilaterally. Cardiac exam regular rate and rhythm with no murmurs, rubs or bruits. Abdomen is soft. He has good bowel sounds. There is no fluid wave. There is no palpable liver or spleen tip. Back exam shows no tenderness over the spine, ribs or hips. Extremities shows no clubbing, cyanosis or edema. Skin exam shows no rashes, ecchymoses or petechia. Neurological exam shows no focal neurological deficits.   Lab Results  Component Value Date   WBC 6.4 04/14/2017   HGB 12.0 (L) 04/14/2017   HCT 37.2 (L) 04/14/2017   MCV 86 04/14/2017   PLT 141 (L) 04/14/2017   No results found for: FERRITIN, IRON, TIBC, UIBC, IRONPCTSAT Lab Results  Component Value Date   RBC 4.33 04/14/2017   No results found for: KPAFRELGTCHN, LAMBDASER, KAPLAMBRATIO No results found for: IGGSERUM, IGA, IGMSERUM No results found for: Odetta Pink, SPEI   Chemistry      Component Value Date/Time   NA 140 04/14/2017 0739   NA 141 07/23/2016 1255   K 3.8 04/14/2017 0739   K 4.1 07/23/2016 1255   CL 106 04/14/2017 0739   CO2 27 04/14/2017 0739   CO2 27 07/23/2016 1255   BUN 12 04/14/2017 0739   BUN 13.8 07/23/2016  1255   CREATININE 0.7 04/14/2017 0739   CREATININE 1.0 07/23/2016 1255      Component Value Date/Time   CALCIUM 9.1 04/14/2017 0739   CALCIUM 9.2 07/23/2016 1255   ALKPHOS 61 04/14/2017 0739   ALKPHOS 77 07/23/2016 1255   AST 27 04/14/2017 0739   AST 23 07/23/2016 1255   ALT 22 04/14/2017 0739   ALT 13 07/23/2016 1255   BILITOT 0.90 04/14/2017 0739   BILITOT 1.13 07/23/2016 1255     Impression and Plan: Brian Mays is 71 year old gentleman with a neuroendocrine carcinoma and liver  metastases. He had a 4th intrahepatic therapy with yttrium-90 back in November 2017.   By the results of the recent PET scan, he clearly has more active disease. I think this PET scan definitely and has been a valuable asset so that we can help with management of him.  I will speak with nuclear medicine or radiation oncology to see who does the radioisotope therapy.  Again, I spoke to he and his wife about this. He understands that this is some that is not curable but clearly will improve his outcome.   We'll go ahead and give him Somatuline today.  I'll like to see him back in about 6 weeks.    Volanda Napoleon, MD 6/14/20188:12 AM

## 2017-04-15 ENCOUNTER — Encounter: Payer: Self-pay | Admitting: Interventional Radiology

## 2017-04-18 LAB — CHROMOGRANIN A: Chromogranin A: 15 nmol/L — ABNORMAL HIGH (ref 0–5)

## 2017-04-27 DIAGNOSIS — D225 Melanocytic nevi of trunk: Secondary | ICD-10-CM | POA: Diagnosis not present

## 2017-04-27 DIAGNOSIS — L821 Other seborrheic keratosis: Secondary | ICD-10-CM | POA: Diagnosis not present

## 2017-04-27 DIAGNOSIS — L570 Actinic keratosis: Secondary | ICD-10-CM | POA: Diagnosis not present

## 2017-04-27 DIAGNOSIS — D1801 Hemangioma of skin and subcutaneous tissue: Secondary | ICD-10-CM | POA: Diagnosis not present

## 2017-05-16 ENCOUNTER — Other Ambulatory Visit: Payer: Self-pay | Admitting: Family Medicine

## 2017-05-24 DIAGNOSIS — E039 Hypothyroidism, unspecified: Secondary | ICD-10-CM | POA: Diagnosis not present

## 2017-05-24 DIAGNOSIS — I10 Essential (primary) hypertension: Secondary | ICD-10-CM | POA: Diagnosis not present

## 2017-05-24 DIAGNOSIS — I252 Old myocardial infarction: Secondary | ICD-10-CM | POA: Diagnosis not present

## 2017-05-24 DIAGNOSIS — Z6829 Body mass index (BMI) 29.0-29.9, adult: Secondary | ICD-10-CM | POA: Diagnosis not present

## 2017-05-24 DIAGNOSIS — K219 Gastro-esophageal reflux disease without esophagitis: Secondary | ICD-10-CM | POA: Diagnosis not present

## 2017-05-24 DIAGNOSIS — Z0000 Encounter for general adult medical examination without abnormal findings: Secondary | ICD-10-CM | POA: Diagnosis not present

## 2017-05-24 DIAGNOSIS — E7800 Pure hypercholesterolemia, unspecified: Secondary | ICD-10-CM | POA: Diagnosis not present

## 2017-05-26 ENCOUNTER — Ambulatory Visit (HOSPITAL_BASED_OUTPATIENT_CLINIC_OR_DEPARTMENT_OTHER): Payer: Medicare HMO

## 2017-05-26 ENCOUNTER — Other Ambulatory Visit (HOSPITAL_BASED_OUTPATIENT_CLINIC_OR_DEPARTMENT_OTHER): Payer: Medicare HMO

## 2017-05-26 ENCOUNTER — Ambulatory Visit (HOSPITAL_BASED_OUTPATIENT_CLINIC_OR_DEPARTMENT_OTHER): Payer: Medicare HMO | Admitting: Hematology & Oncology

## 2017-05-26 VITALS — BP 147/70 | HR 63 | Temp 98.2°F | Resp 16 | Wt 203.8 lb

## 2017-05-26 DIAGNOSIS — C7B8 Other secondary neuroendocrine tumors: Secondary | ICD-10-CM

## 2017-05-26 DIAGNOSIS — C7A8 Other malignant neuroendocrine tumors: Secondary | ICD-10-CM | POA: Diagnosis not present

## 2017-05-26 LAB — CMP (CANCER CENTER ONLY)
ALBUMIN: 3.4 g/dL (ref 3.3–5.5)
ALK PHOS: 57 U/L (ref 26–84)
ALT: 14 U/L (ref 10–47)
AST: 35 U/L (ref 11–38)
BILIRUBIN TOTAL: 1.1 mg/dL (ref 0.20–1.60)
BUN, Bld: 10 mg/dL (ref 7–22)
CALCIUM: 9.3 mg/dL (ref 8.0–10.3)
CO2: 30 meq/L (ref 18–33)
CREATININE: 1 mg/dL (ref 0.6–1.2)
Chloride: 105 mEq/L (ref 98–108)
Glucose, Bld: 102 mg/dL (ref 73–118)
Potassium: 4.5 mEq/L (ref 3.3–4.7)
SODIUM: 143 meq/L (ref 128–145)
Total Protein: 7.7 g/dL (ref 6.4–8.1)

## 2017-05-26 LAB — CBC WITH DIFFERENTIAL (CANCER CENTER ONLY)
BASO#: 0.1 10*3/uL (ref 0.0–0.2)
BASO%: 0.8 % (ref 0.0–2.0)
EOS ABS: 0.4 10*3/uL (ref 0.0–0.5)
EOS%: 6.7 % (ref 0.0–7.0)
HEMATOCRIT: 36.6 % — AB (ref 38.7–49.9)
HEMOGLOBIN: 11.9 g/dL — AB (ref 13.0–17.1)
LYMPH#: 1.1 10*3/uL (ref 0.9–3.3)
LYMPH%: 18.5 % (ref 14.0–48.0)
MCH: 27.6 pg — ABNORMAL LOW (ref 28.0–33.4)
MCHC: 32.5 g/dL (ref 32.0–35.9)
MCV: 85 fL (ref 82–98)
MONO#: 0.5 10*3/uL (ref 0.1–0.9)
MONO%: 8 % (ref 0.0–13.0)
NEUT#: 4 10*3/uL (ref 1.5–6.5)
NEUT%: 66 % (ref 40.0–80.0)
Platelets: 168 10*3/uL (ref 145–400)
RBC: 4.31 10*6/uL (ref 4.20–5.70)
RDW: 15.8 % — ABNORMAL HIGH (ref 11.1–15.7)
WBC: 6 10*3/uL (ref 4.0–10.0)

## 2017-05-26 MED ORDER — LANREOTIDE ACETATE 120 MG/0.5ML ~~LOC~~ SOLN
SUBCUTANEOUS | Status: AC
  Filled 2017-05-26: qty 120

## 2017-05-26 MED ORDER — LANREOTIDE ACETATE 120 MG/0.5ML ~~LOC~~ SOLN
120.0000 mg | Freq: Once | SUBCUTANEOUS | Status: AC
  Administered 2017-05-26: 120 mg via SUBCUTANEOUS

## 2017-05-26 NOTE — Progress Notes (Signed)
Hematology and Oncology Follow Up Visit  Brian Mays [id number] December 26, 1945 71 y.o. 05/26/2017   Principle Diagnosis:  Metastatic low grade neuroendocrine tumor-hepatic metastases  Current Therapy:   S/p third yttrium-90 intrahepatic therapy - November 2016 Somatuline 120 mg monthly    Interim History: Brian Mays is here today for a follow-up. He is doing pretty well. He has had no specific complaints.   We are trying to get him set up with the [place]. Since this was recently FDA approved, this would be a great treatment option for him. I'm not sure when exactly our radiology Department will have this available.   He says that he might be flushing a little bit more. He's not having any diarrhea. There is no wheezing. Has not noted any rashes.   His last chromogranin A was 21.   He is still quite active. He is gaining a little bit of weight. He is trying to lose some of the weight.  So far, he is out of pretty busy summer. He is still working. He is trying to play golf.   Overall, his performance status is ECOG 0.  Medications:  Allergies as of 05/26/2017      Reactions   Iohexol Hives    Code: HIVES, Desc: PER [name] @ PRIMARY CARE, PT IS ALLERGIC TO CONTRAST DYE 10/02/08/RM  05/01/10...needs full premeds per our protocol w/ [facility]., Onset Date: [id number]      Medication List       Accurate as of 05/26/17 12:08 PM. Always use your most recent med list.          amLODipine 5 MG tablet Commonly known as:  NORVASC Take 5 mg by mouth daily.   aspirin EC 81 MG tablet Take 81 mg by mouth every morning.   atorvastatin 80 MG tablet Commonly known as:  LIPITOR Take 1 tablet (80 mg total) by mouth daily at 6 PM.   Cholecalciferol 1000 units tablet Take 1,000 Units by mouth daily.   diphenhydrAMINE 50 MG tablet Commonly known as:  BENADRYL Take 1 tablet (50 mg total) by mouth once. Take 1 hour prior to scan.   Fish Oil 1000 MG Caps Take 1 capsule by mouth daily.  Reported on 12/30/2015   fluticasone 50 MCG/ACT nasal spray Commonly known as:  FLONASE Place 2 sprays into both nostrils daily as needed (sinuses).   hydrocortisone 25 MG suppository Commonly known as:  ANUSOL-HC INSERT 1 SUPPOSITORY RECTALLY TWICE A DAY AS NEEDED FOR HEMORRHOIDS   lansoprazole 30 MG capsule Commonly known as:  PREVACID Take 1 capsule (30 mg total) by mouth daily at 12 noon.   levothyroxine 50 MCG tablet Commonly known as:  SYNTHROID, LEVOTHROID Take 1 tablet (50 mcg total) by mouth daily.   losartan 100 MG tablet Commonly known as:  COZAAR Take 1 tablet (100 mg total) by mouth at bedtime.   metoprolol tartrate 25 MG tablet Commonly known as:  LOPRESSOR Take 1 tablet (25 mg total) by mouth 2 (two) times daily.   nitroGLYCERIN 0.4 MG SL tablet Commonly known as:  NITROSTAT Place 1 tablet (0.4 mg total) under the tongue every 5 (five) minutes x 3 doses as needed for chest pain.   PARoxetine 20 MG tablet Commonly known as:  PAXIL Take 1 tablet (20 mg total) by mouth daily.   PARoxetine 10 MG tablet Commonly known as:  PAXIL TAKE 1 TABLET BY MOUTH DAILY   predniSONE 50 MG tablet Commonly known as:  DELTASONE Take  50mg  at 13 hours, 7 hours, and 1 hour before scan   sodium chloride 0.65 % Soln nasal spray Commonly known as:  OCEAN Place 1 spray into both nostrils as needed for congestion.   SOMATULINE DEPOT [place] Inject 120 mcg into the skin every 28 (twenty-eight) days. Receives at Dr Antonieta Pert office   ticagrelor 90 MG Tabs tablet Commonly known as:  BRILINTA Take 1 tablet (90 mg total) by mouth 2 (two) times daily.   vitamin B-12 1000 MCG tablet Commonly known as:  CYANOCOBALAMIN Take 1,000 mcg by mouth daily.       Allergies:  Allergies  Allergen Reactions  . Iohexol Hives     Code: HIVES, Desc: PER [name] @ PRIMARY CARE, PT IS ALLERGIC TO CONTRAST DYE 10/02/08/RM  05/01/10...needs full premeds per our protocol w/ [facility]., Onset Date:  [id number]     Past Medical History, Surgical history, Social history, and Family History were reviewed and updated.  Review of Systems: All other 10 point review of systems is negative.   Physical Exam:  weight is 203 lb 12.8 oz (92.4 kg). His oral temperature is 98.2 F (36.8 C). His blood pressure is 147/70 (abnormal) and his pulse is 63. His respiration is 16 and oxygen saturation is 99%.   Wt Readings from Last 3 Encounters:  05/26/17 203 lb 12.8 oz (92.4 kg)  04/14/17 203 lb (92.1 kg)  03/18/17 200 lb 12.8 oz (91.1 kg)    Well-developed and well-nourished white male in no obvious distress. Head and neck exam shows no ocular or oral lesions. He has no obvious facial flushing. He has no adenopathy in the neck. Lungs are clear bilaterally. Cardiac exam regular rate and rhythm with no murmurs, rubs or bruits. Abdomen is soft. He has good bowel sounds. There is no fluid wave. There is no palpable liver or spleen tip. Back exam shows no tenderness over the spine, ribs or hips. Extremities shows no clubbing, cyanosis or edema. Skin exam shows no rashes, ecchymoses or petechia. Neurological exam shows no focal neurological deficits.   Lab Results  Component Value Date   WBC 6.0 05/26/2017   HGB 11.9 (L) 05/26/2017   HCT 36.6 (L) 05/26/2017   MCV 85 05/26/2017   PLT 168 05/26/2017   No results found for: FERRITIN, IRON, TIBC, UIBC, IRONPCTSAT Lab Results  Component Value Date   RBC 4.31 05/26/2017   No results found for: KPAFRELGTCHN, LAMBDASER, KAPLAMBRATIO No results found for: IGGSERUM, IGA, IGMSERUM No results found for: Odetta Pink, SPEI   Chemistry      Component Value Date/Time   NA 143 05/26/2017 1104   NA 141 07/23/2016 1255   K 4.5 05/26/2017 1104   K 4.1 07/23/2016 1255   CL 105 05/26/2017 1104   CO2 30 05/26/2017 1104   CO2 27 07/23/2016 1255   BUN 10 05/26/2017 1104   BUN 13.8 07/23/2016 1255   CREATININE  1.0 05/26/2017 1104   CREATININE 1.0 07/23/2016 1255      Component Value Date/Time   CALCIUM 9.3 05/26/2017 1104   CALCIUM 9.2 07/23/2016 1255   ALKPHOS 57 05/26/2017 1104   ALKPHOS 77 07/23/2016 1255   AST 35 05/26/2017 1104   AST 23 07/23/2016 1255   ALT 14 05/26/2017 1104   ALT 13 07/23/2016 1255   BILITOT 1.10 05/26/2017 1104   BILITOT 1.13 07/23/2016 1255     Impression and Plan: Mr. Petroni is 71 year old gentleman with a neuroendocrine  carcinoma and liver metastases. He had a 4th intrahepatic therapy with yttrium-90 back in November 2017.   We have to speak with radiology to see what the logistics is of time to give him the Lutathera treatment. This, I believe, is really the best option for [place].  I will like to see him back in 4 weeks.  Burney Gauze, MD  7/26/201812:08 PM

## 2017-05-27 LAB — CHROMOGRANIN A: Chromogranin A: 12 nmol/L — ABNORMAL HIGH (ref 0–5)

## 2017-06-08 ENCOUNTER — Ambulatory Visit (INDEPENDENT_AMBULATORY_CARE_PROVIDER_SITE_OTHER): Payer: Medicare HMO | Admitting: Family Medicine

## 2017-06-08 ENCOUNTER — Encounter: Payer: Self-pay | Admitting: Family Medicine

## 2017-06-08 VITALS — BP 124/62 | HR 59 | Temp 98.1°F | Ht 70.5 in | Wt 205.0 lb

## 2017-06-08 DIAGNOSIS — S46812A Strain of other muscles, fascia and tendons at shoulder and upper arm level, left arm, initial encounter: Secondary | ICD-10-CM

## 2017-06-08 NOTE — Progress Notes (Signed)
Musculoskeletal Exam  Patient: Brian Mays DOB: 21-Jul-1946  DOS: 06/08/2017  SUBJECTIVE:  Chief Complaint:   Chief Complaint  Patient presents with  . Shoulder Pain    (L)-comes and goes-pain radiating to the neck    Brian Mays is a 71 y.o.  male for evaluation and treatment of L shoulder pain.   Onset:  2-3 days ago. No injury or change in activity.  Location: over scapular area Approximately 80% better Character:  aching, dull  Progression of issue:  has significantly improved Associated symptoms: none Denies Treatment: to date has been Heat.   Neurovascular symptoms: no  ROS: Musculoskeletal/Extremities: +L shoulder pain Neurologic: no numbness, tingling no weakness   Past Medical History:  Diagnosis Date  . Anemia    in past  . Colon polyps   . Diverticulosis   . Gallstones   . GERD (gastroesophageal reflux disease)   . Heart attack ([place])    mild, Spring 2017  . Heart murmur   . Hemorrhoid   . Hiatal hernia   . HTN (hypertension)   . Hypothyroidism   . Metastatic carcinoma ([place]) 2010   Dr Jonette Eva  . Pancreatitis 1998   chronic   Past Surgical History:  Procedure Laterality Date  . APPENDECTOMY  1962  . CARDIAC CATHETERIZATION N/A 02/10/2016   Procedure: Left Heart Cath and Coronary Angiography;  Surgeon: Adrian Prows, MD;  Location: [facility];  Service: Cardiovascular;  Laterality: N/A;  . CARDIAC CATHETERIZATION  02/10/2016   Procedure: Coronary/Graft Atherectomy;  Surgeon: Adrian Prows, MD;  Location: [facility];  Service: Cardiovascular;;  . CARDIAC CATHETERIZATION  02/10/2016   Procedure: Coronary Stent Intervention;  Surgeon: Adrian Prows, MD;  Location: [facility];  Service: Cardiovascular;;  . CATARACT EXTRACTION W/ INTRAOCULAR LENS  IMPLANT, BILATERAL Bilateral   . IR GENERIC HISTORICAL  12/30/2015   IR RADIOLOGIST EVAL & MGMT 12/30/2015 Aletta Edouard, MD [facility] INTERV RAD  . IR GENERIC HISTORICAL  11/09/2016   IR RADIOLOGIST  EVAL & MGMT 11/09/2016 Aletta Edouard, MD [facility] INTERV RAD  . IR RADIOLOGIST EVAL & MGMT  03/01/2017  . LAPAROSCOPIC CHOLECYSTECTOMY  1999  . LIVER BIOPSY  2010  . RADIOACTIVE SEED IMPLANT  X 3   "to my liver"  . TUMOR EXCISION  01/2009   Carcinoil Resection   . TUMOR REMOVAL     from small intestine   Family History  Problem Relation Age of Onset  . Kidney disease Mother   . Hyperlipidemia Mother   . Hypertension Mother   . COPD Father   . Ulcerative colitis Daughter    Current Outpatient Prescriptions  Medication Sig Dispense Refill  . amLODipine (NORVASC) 5 MG tablet Take 5 mg by mouth daily.  3  . aspirin EC 81 MG tablet Take 81 mg by mouth every morning.    [name] atorvastatin (LIPITOR) 80 MG tablet Take 1 tablet (80 mg total) by mouth daily at 6 PM. (Patient taking differently: Take 40 mg by mouth. Take 1/2 tablet (40 MG) by mouth daily at 6 PM.) 30 tablet 1  . Cholecalciferol 1000 UNITS tablet Take 1,000 Units by mouth daily.      . fluticasone (FLONASE) 50 MCG/ACT nasal spray Place 2 sprays into both nostrils daily as needed (sinuses).   11  . hydrocortisone (ANUSOL-HC) 25 MG suppository INSERT 1 SUPPOSITORY RECTALLY TWICE A DAY AS NEEDED FOR HEMORRHOIDS 30 suppository 0  . Lanreotide Acetate (SOMATULINE DEPOT [place]) Inject 120  mcg into the skin every 28 (twenty-eight) days. Receives at Dr Antonieta Pert office    . lansoprazole (PREVACID) 30 MG capsule Take 1 capsule (30 mg total) by mouth daily at 12 noon. 90 capsule 3  . levothyroxine (SYNTHROID, LEVOTHROID) 50 MCG tablet Take 1 tablet (50 mcg total) by mouth daily. 90 tablet 1  . losartan (COZAAR) 100 MG tablet Take 1 tablet (100 mg total) by mouth at bedtime. 30 tablet 3  . metoprolol tartrate (LOPRESSOR) 25 MG tablet Take 1 tablet (25 mg total) by mouth 2 (two) times daily. 60 tablet There is5  . nitroGLYCERIN (NITROSTAT) 0.4 MG SL tablet Place 1 tablet (0.4 mg total) under the tongue every 5 (five) minutes x 3 doses as needed for chest  pain. 25 tablet 4  . Omega-3 Fatty Acids (FISH OIL) 1000 MG CAPS Take 1 capsule by mouth daily. Reported on 12/30/2015    . PARoxetine (PAXIL) 10 MG tablet TAKE 1 TABLET BY MOUTH DAILY 90 tablet 3  . PARoxetine (PAXIL) 20 MG tablet Take 1 tablet (20 mg total) by mouth daily. 30 tablet 5  . predniSONE (DELTASONE) 50 MG tablet Take 50mg  at 13 hours, 7 hours, and 1 hour before scan 3 tablet 2  . sodium chloride (OCEAN) 0.65 % SOLN nasal spray Place 1 spray into both nostrils as needed for congestion.     . ticagrelor (BRILINTA) 90 MG TABS tablet Take 1 tablet (90 mg total) by mouth 2 (two) times daily. 60 tablet 0  . vitamin B-12 (CYANOCOBALAMIN) 1000 MCG tablet Take 1,000 mcg by mouth daily.    . diphenhydrAMINE (BENADRYL) 50 MG tablet Take 1 tablet (50 mg total) by mouth once. Take 1 hour prior to scan. 1 tablet 0   Allergies  Allergen Reactions  . Iohexol Hives     Code: HIVES, Desc: PER [name] @ [facility]IMARY [facility], PT IS ALLERGIC TO CONTRAST DYE 10/02/08/RM  05/01/10...needs full premeds per our protocol w/ [facility]., Onset Date: [id number]    Social History   Social History  . Marital status: Married   Occupational History  . [place] History Main Topics  . Smoking status: Former Smoker    Packs/day: 1.50    Years: 35.00    Types: Cigarettes    Start date: 09/25/1959    Quit date: 12/21/1997  . Smokeless tobacco: Never Used  . Alcohol use 1.8 - 2.4 oz/week    3 - 4 Cans of beer per week     Comment: 4 beers weekly   . Drug use: No  . Sexual activity: Yes   Social History Narrative   Regular Exercise -  NO    Objective: VITAL SIGNS: BP 124/62 (BP Location: Left Arm, Patient Position: Sitting, Cuff Size: Normal)   Pulse (!) 59   Temp 98.1 F (36.7 C) (Oral)   Ht 5' 10.5" (1.791 m)   Wt 205 lb (93 kg)   SpO2 97%   BMI 29.00 kg/m  Constitutional: Well formed, well developed. No acute distress. Thorax & Lungs: No accessory muscle use Extremities: No  clubbing. No cyanosis. No edema.  Skin: Warm. Dry. No erythema. No rash.  Musculoskeletal: L shoulder.   Normal active range of motion: yes.   Normal passive range of motion: yes Tenderness to palpation: yes, over left trapezius Deformity: no Ecchymosis: no Tests positive: none Tests negative: Spurling's, speed's, Neer's, Hawkins, crossover, O'Briens, liftoff Neurologic: Normal sensory function. No focal deficits noted. DTR's equal and  symmetry in UE's. No clonus. Psychiatric: Normal mood. Age appropriate judgment and insight. Alert & oriented x 3.    Assessment:  Strain of left trapezius muscle, initial encounter  Plan: Heat, Tylenol, home stretches/exercises given. If things worsen or fail to improve, return to clinic. Will consider physical therapy vs trigger point injection at that time. The patient voiced understanding and agreement to the plan.   [place], DO 06/08/17  11:06 AM

## 2017-06-08 NOTE — Patient Instructions (Signed)
Heat (pad or rice pillow in microwave) over affected area, 10-15 minutes every 2-3 hours while awake.   OK to take Tylenol 1000 mg (2 extra strength tabs) or 975 mg (3 regular strength tabs) every 6 hours as needed.  [facility] Ask your health care provider which exercises are safe for you. Do exercises exactly as told by your health care provider and adjust them as directed. It is normal to feel mild stretching, pulling, tightness, or discomfort as you do these exercises, but you should stop right away if you feel sudden pain or your pain gets worse.Do not begin these exercises until told by your health care provider. Stretching and range of motion exercises These exercises warm up your muscles and joints and improve the movement and flexibility of your shoulder. These exercises can also help to relieve pain, numbness, and tingling. If you are unable to do any of the following for any reason, do not further attempt to do it.  Exercise A: Flexion, standing    1. Stand and hold a broomstick, a cane, or a similar object. Place your hands a little more than shoulder-width apart on the object. Your left / right hand should be palm-up, and your other hand should be palm-down. 2. Push the stick to raise your left / right arm out to your side and then over your head. Use your other hand to help move the stick. Stop when you feel a stretch in your shoulder, or when you reach the angle that is recommended by your health care provider. ? Avoid shrugging your shoulder while you raise your arm. Keep your shoulder blade tucked down toward your spine. 3. Hold for 10-15 seconds. 4. Slowly return to the starting position. Repeat 2-3 times. Complete this exercise 1 time a day.  Exercise B: Abduction, supine    1. Lie on your back and hold a broomstick, a cane, or a similar object. Place your hands a little more than shoulder-width apart on the object. Your left / right hand should be palm-up, and your  other hand should be palm-down. 2. Push the stick to raise your left / right arm out to your side and then over your head. Use your other hand to help move the stick. Stop when you feel a stretch in your shoulder, or when you reach the angle that is recommended by your health care provider. ? Avoid shrugging your shoulder while you raise your arm. Keep your shoulder blade tucked down toward your spine. 3. Hold for 10-15 seconds. 4. Slowly return to the starting position. Repeat 2-3 times. Complete this exercise 1 time  a day.  Exercise C: Flexion, active-assisted    1. Lie on your back. You may bend your knees for comfort. 2. Hold a broomstick, a cane, or a similar object. Place your hands about shoulder-width apart on the object. Your palms should face toward your feet. 3. Raise the stick and move your arms over your head and behind your head, toward the floor. Use your healthy arm to help your left / right arm move farther. Stop when you feel a gentle stretch in your shoulder, or when you reach the angle where your health care provider tells you to stop. 4. Hold for 10-15 seconds. 5. Slowly return to the starting position. Repeat 2-3 times. Complete this exercise 1 time  a day.  Exercise D: External rotation and abduction    1. Stand in a door frame with one of your feet slightly in  front of the other. This is called a staggered stance. 2. Choose one of the following positions as told by your health care provider: ? Place your hands and forearms on the door frame above your head. ? Place your hands and forearms on the door frame at the height of your head. ? Place your hands on the door frame at the height of your elbows. 3. Slowly move your weight onto your front foot until you feel a stretch across your chest and in the front of your shoulders. Keep your head and chest upright and keep your abdominal muscles tight. 4. Hold for 10-15 seconds. 5. To release the stretch, shift your  weight to your back foot. Repeat 2-3 times. Complete this stretch 1 time  a day.  Strengthening exercises These exercises build strength and endurance in your shoulder. Endurance is the ability to use your muscles for a long time, even after your muscles get tired. Exercise E: Scapular depression and adduction  1. Sit on a stable chair. Support your arms in front of you with pillows, armrests, or a tabletop. Keep your elbows in line with the sides of your body. 2. Gently move your shoulder blades down toward your middle back. Relax the muscles on the tops of your shoulders and in the back of your neck. 3. Hold for 10-15 seconds. 4. Slowly release the tension and relax your muscles completely before doing this exercise again. 5. After you have practiced this exercise, try doing the exercise without the arm support. Then, try the exercise while standing instead of sitting. Repeat 2-3 times. Complete this exercise 1 time  a day.  Exercise F: Shoulder abduction, isometric    1. Stand or sit about 4-6 inches (10-15 cm) from a wall with your left / right side facing the wall. 2. Bend your left / right elbow and gently press your elbow against the wall. 3. Increase the pressure slowly until you are pressing as hard as you can without shrugging your shoulder. 4. Hold for 10-15 seconds. 5. Slowly release the tension and relax your muscles completely. Repeat 2-3 times. Complete this exercise 1 time  a day.  Exercise G: Shoulder flexion, isometric    1. Stand or sit about 4-6 inches (10-15 cm) away from a wall with your left / right side facing the wall. 2. Keep your left / right elbow straight and gently press the top of your fist against the wall. Increase the pressure slowly until you are pressing as hard as you can without shrugging your shoulder. 3. Hold for 10-15 seconds. 4. Slowly release the tension and relax your muscles completely. Repeat 2-3 times. Complete this exercise 1 time  a  day.  Exercise H: Internal rotation    1. Sit in a stable chair without armrests, or stand. Secure an exercise band at your left / right side, at elbow height. 2. Place a soft object, such as a folded towel or a small pillow, under your left / right upper arm so your elbow is a few inches (about 8 cm) away from your side. 3. Hold the end of the exercise band so the band stretches. 4. Keeping your elbow pressed against the soft object under your arm, move your forearm across your body toward your abdomen. Keep your body steady so the movement is only coming from your shoulder. 5. Hold for 10-15 seconds. 6. Slowly return to the starting position. Repeat 2-3 times. Complete this exercise 1 time  a day.  Exercise I: External rotation    1. Sit in a stable chair without armrests, or stand. 2. Secure an exercise band at your left / right side, at elbow height. 3. Place a soft object, such as a folded towel or a small pillow, under your left / right upper arm so your elbow is a few inches (about 8 cm) away from your side. 4. Hold the end of the exercise band so the band stretches. 5. Keeping your elbow pressed against the soft object under your arm, move your forearm out, away from your abdomen. Keep your body steady so the movement is only coming from your shoulder. 6. Hold for 10-15 seconds. 7. Slowly return to the starting position. Repeat 2-3 times. Complete this exercise 1 time  a day. Exercise J: Shoulder extension  1. Sit in a stable chair without armrests, or stand. Secure an exercise band to a stable object in front of you so the band is at shoulder height. 2. Hold one end of the exercise band in each hand. Your palms should face each other. 3. Straighten your elbows and lift your hands up to shoulder height. 4. Step back, away from the secured end of the exercise band, until the band stretches. 5. Squeeze your shoulder blades together and pull your hands down to the sides of your  thighs. Stop when your hands are straight down by your sides. Do not let your hands go behind your body. 6. Hold for 10-15 seconds. 7. Slowly return to the starting position. Repeat 2-3 times. Complete this exercise 1 time  a day.  Exercise K: Shoulder extension, prone    1. Lie on your abdomen on a firm surface so your left / right arm hangs over the edge. 2. Hold a 5 lb weight in your hand so your palm faces in toward your body. Your arm should be straight. 3. Squeeze your shoulder blade down toward the middle of your back. 4. Slowly raise your arm behind you, up to the height of the surface that you are lying on. Keep your arm straight. 5. Hold for 10-15 seconds. 6. Slowly return to the starting position and relax your muscles. Repeat 2-3 times. Complete this exercise 1 time  a day.  Exercise L: Horizontal abduction, prone  1. Lie on your abdomen on a firm surface so your left / right arm hangs over the edge. 2. Hold a 5 lb weight in your hand so your palm faces toward your feet. Your arm should be straight. 3. Squeeze your shoulder blade down toward the middle of your back. 4. Bend your elbow so your hand moves up, until your elbow is bent to an "L" shape (90 degrees). With your elbow bent, slowly move your forearm forward and up. Raise your hand up to the height of the surface that you are lying on. ? Your upper arm should not move, and your elbow should stay bent. ? At the top of the movement, your palm should face the floor. 5. Hold for 10-15 seconds. 6. Slowly return to the starting position and relax your muscles. Repeat 2-3 times. Complete this exercise 1 time a day.  Exercise M: Horizontal abduction, standing  1. Sit on a stable chair, or stand. 2. Secure an exercise band to a stable object in front of you so the band is at shoulder height. 3. Hold one end of the exercise band in each hand. 4. Straighten your elbows and lift your hands straight in front of  you, up to  shoulder height. Your palms should face down, toward the floor. 5. Step back, away from the secured end of the exercise band, until the band stretches. 6. Move your arms out to your sides, and keep your arms straight. 7. Hold for 10-15 seconds. 8. Slowly return to the starting position. Repeat 2-3 times. Complete this exercise 1 time a day.  Exercise N: Scapular retraction and elevation  1. Sit on a stable chair, or stand. 2. Secure an exercise band to a stable object in front of you so the band is at shoulder height. 3. Hold one end of the exercise band in each hand. Your palms should face each other. 4. Sit in a stable chair without armrests, or stand. 5. Step back, away from the secured end of the exercise band, until the band stretches. 6. Squeeze your shoulder blades together and lift your hands over your head. Keep your elbows straight. 7. Hold for 10-15 seconds. 8. Slowly return to the starting position. Repeat 3-4 times. Complete this exercise 1-2 times a day.  This information is not intended to replace advice given to you by your health care provider. Make sure you discuss any questions you have with your health care provider. Document Released: 10/18/2005 Document Revised: 06/24/2016 Document Reviewed: 09/04/2015 Elsevier Interactive Patient Education  2017 [employer].

## 2017-06-13 NOTE — Addendum Note (Signed)
Addended by: Burney Gauze R on: 06/13/2017 05:10 PM   Modules accepted: Orders

## 2017-06-14 ENCOUNTER — Other Ambulatory Visit (HOSPITAL_COMMUNITY): Payer: Self-pay | Admitting: Diagnostic Radiology

## 2017-06-14 DIAGNOSIS — C7A8 Other malignant neuroendocrine tumors: Secondary | ICD-10-CM

## 2017-06-15 ENCOUNTER — Ambulatory Visit (INDEPENDENT_AMBULATORY_CARE_PROVIDER_SITE_OTHER): Payer: Medicare HMO | Admitting: Family Medicine

## 2017-06-15 ENCOUNTER — Ambulatory Visit (HOSPITAL_BASED_OUTPATIENT_CLINIC_OR_DEPARTMENT_OTHER)
Admission: RE | Admit: 2017-06-15 | Discharge: 2017-06-15 | Disposition: A | Discharge status: Home / Self Care | Payer: Medicare HMO | Source: Ambulatory Visit | Attending: Family Medicine | Admitting: Family Medicine

## 2017-06-15 ENCOUNTER — Encounter: Payer: Self-pay | Admitting: Family Medicine

## 2017-06-15 ENCOUNTER — Telehealth: Payer: Self-pay | Admitting: Family Medicine

## 2017-06-15 VITALS — BP 136/67 | HR 66 | Temp 98.2°F | Ht 70.5 in | Wt 204.0 lb

## 2017-06-15 DIAGNOSIS — M7989 Other specified soft tissue disorders: Secondary | ICD-10-CM | POA: Insufficient documentation

## 2017-06-15 DIAGNOSIS — M79671 Pain in right foot: Secondary | ICD-10-CM

## 2017-06-15 DIAGNOSIS — S9031XA Contusion of right foot, initial encounter: Secondary | ICD-10-CM | POA: Diagnosis not present

## 2017-06-15 DIAGNOSIS — R55 Syncope and collapse: Secondary | ICD-10-CM

## 2017-06-15 LAB — TROPONIN I: Troponin I: <0.01 ng/mL (ref <=0.05)

## 2017-06-15 IMAGING — DX DG FOOT COMPLETE 3+V*R*
3 series · 3 of 3 positions shown · non-contrast
Comparison: None.

CLINICAL DATA: Right foot pain after a fall yesterday, bruising
across the first through fifth metatarsal heads. Initial encounter.

EXAM:
RIGHT FOOT COMPLETE - 3+ VIEW

[foot ap]
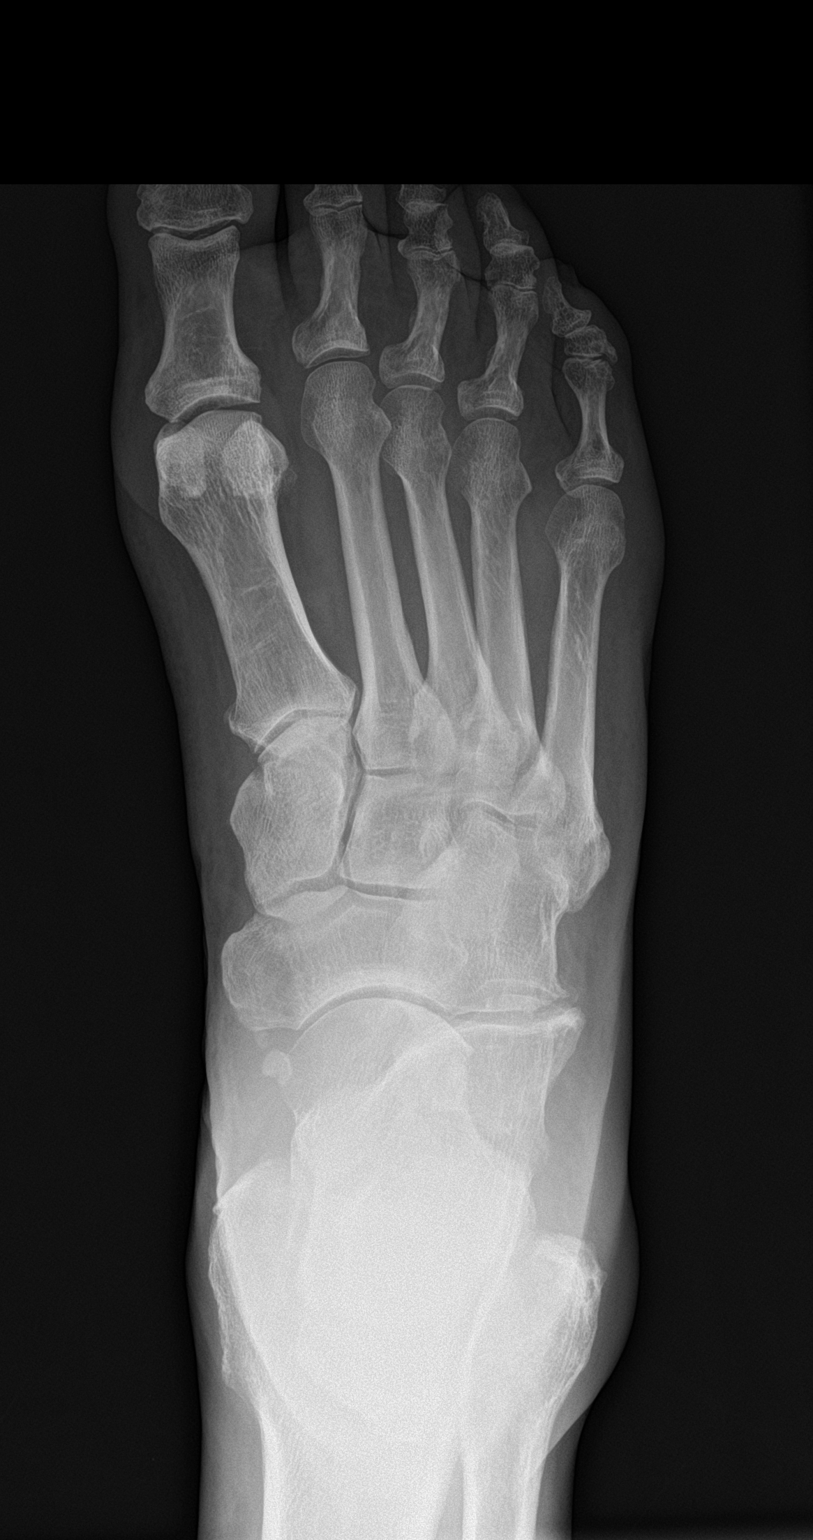

[foot obl]
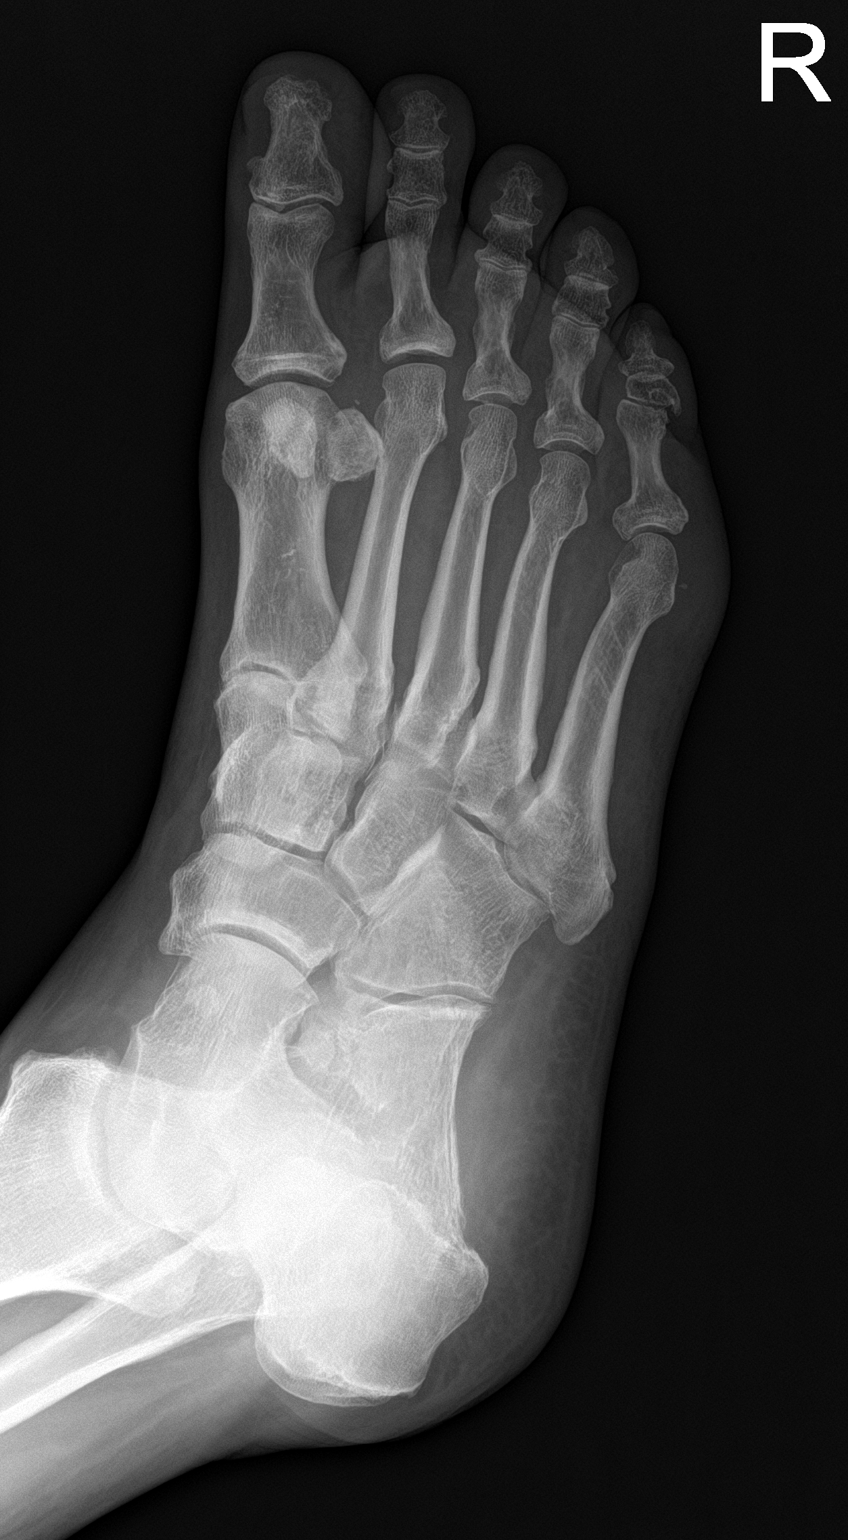

[foot lat]
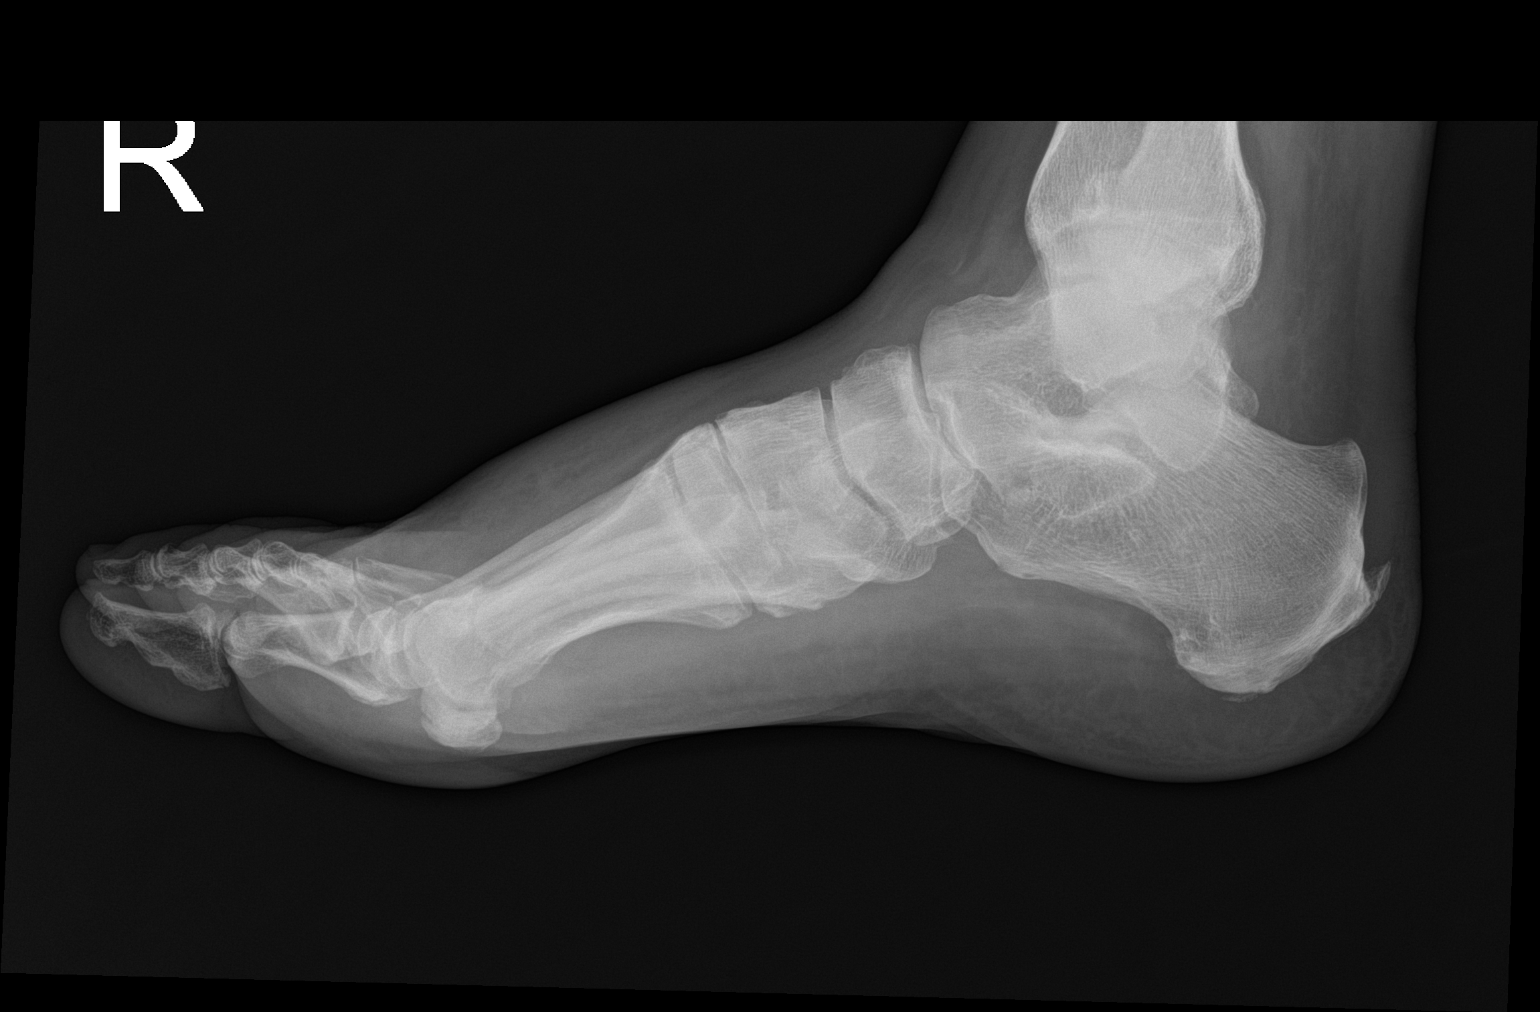

[3 of 3 positions shown; findings below may reference images not displayed]

FINDINGS: There are old fractures of the third and fourth proximal phalangeal
shafts. Degenerative changes in the fifth proximal interphalangeal
joint. Forefoot soft tissue swelling without acute osseous
abnormality. Calcaneal spurs.
IMPRESSION: Forefoot soft tissue swelling without acute osseous abnormality.

## 2017-06-15 NOTE — Progress Notes (Signed)
Pre visit review using our clinic tool,if applicable. No additional management support is needed unless otherwise documented below in the visit note.  

## 2017-06-15 NOTE — Progress Notes (Signed)
[place] at [facility] [address] [place], [place] [address] [phone]  Date:  06/15/2017   Name:  Brian Mays   DOB:  Sep 09, 1946   MRN:  [id number]  PCP:  Darreld Mclean, MD    Chief Complaint: Fall (Right foot pain and swelling)   History of Present Illness:  Brian Mays is a 71 y.o. very pleasant male patient who presents with the following:  Yesterday he had "a light headed spell" and fell while he was at work, injured his right foot somehow.  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  He did not feel like he over-did it really, he did not have to work too hard.  However he then started to feel kind of strange and lightheaded.  Went up to the employee office and had a near syncopal event- he felt like he might pass out and fell to the ground.  However he was never unconscious  EMS was called and checked him and he did not go to the ER, his wife came and drove him home  He did not hit his head, he did not have actual LOC. He did not have any slurred speech, facial drooping or any numbness He did twist his right foot when he fell and it is bruised and a bit tender on the dorsum.  He is able to walk and does not think it is broken  He has had this sort of symptoms to a lesser extent in the past when he over-exerts himself He also did have an MI last year.  NSTEMI- he was treated with PCI Per discharge summary from last April  Primary Discharge Diagnosis: CAD s/p PTCA and rotational atherectomy with 1.5 mm burr followed by stenting of tandem lesion in the proximal to mid segment of the RCA and mid to distal segment of the right coronary artery with implantation of two 3.5 x 32 mm and a 3.5 x 16 mm Synergy DES for high-grade calcific 95% to 90% stenosis respectively.   Secondary Discharge Diagnosis: 1. NSTEMI 2. HLD 3. Essential  Hypertension 4. Hyperlipidemia, mild and low HDL.  He has not noted any CP or SOB at this time, no palpiations His cardiologist is Dr. Einar Gip  He has not had any vomiting, diarrhea, fever, cough or other signs of illness recently   Patient Active Problem List   Diagnosis Date Noted  . CAD (coronary artery disease) 09/21/2016  . Unstable angina ([place]) 02/09/2016  . Vitamin D deficiency 03/11/2015  . Metastatic malignant neuroendocrine tumor to liver ([place]) 02/14/2015  . Hyperglycemia 04/03/2012  . Diarrhea 03/29/2012  . B12 deficiency 04/19/2011  . TOBACCO USE, QUIT 10/13/2009  . Unspecified vitamin D deficiency 06/19/2009  . Neoplasm by body site 12/23/2008  . DIVERTICULOSIS, COLON 11/08/2008  . ABDOMINAL PAIN 10/02/2008  . Hypothyroidism 12/13/2007  . HEMORRHOIDS, NOS 12/13/2007  . COLONIC POLYPS, HX OF 12/13/2007  . Essential hypertension 09/15/2007  . GERD 09/15/2007  . PANCREATITIS, CHRONIC 09/15/2007  . CARDIAC MURMUR 09/15/2007  . SNORING 09/15/2007    Past Medical History:  Diagnosis Date  . Anemia    in past  . Colon polyps   . Diverticulosis   . Gallstones   . GERD (gastroesophageal reflux disease)   . Heart attack ([place])    mild, Spring 2017  . Heart  murmur   . Hemorrhoid   . Hiatal hernia   . HTN (hypertension)   . Hypothyroidism   . Metastatic carcinoma ([place]) 2010   Dr Jonette Eva  . Pancreatitis 1998   chronic    Past Surgical History:  Procedure Laterality Date  . APPENDECTOMY  1962  . CARDIAC CATHETERIZATION N/A 02/10/2016   Procedure: Left Heart Cath and Coronary Angiography;  Surgeon: Adrian Prows, MD;  Location: [facility];  Service: Cardiovascular;  Laterality: N/A;  . CARDIAC CATHETERIZATION  02/10/2016   Procedure: Coronary/Graft Atherectomy;  Surgeon: Adrian Prows, MD;  Location: [facility];  Service: Cardiovascular;;  . CARDIAC CATHETERIZATION  02/10/2016   Procedure: Coronary Stent Intervention;  Surgeon: Adrian Prows, MD;  Location: [facility];  Service: Cardiovascular;;  . CATARACT EXTRACTION W/ INTRAOCULAR LENS  IMPLANT, BILATERAL Bilateral   . IR GENERIC HISTORICAL  12/30/2015   IR RADIOLOGIST EVAL & MGMT 12/30/2015 Aletta Edouard, MD [facility] INTERV RAD  . IR GENERIC HISTORICAL  11/09/2016   IR RADIOLOGIST EVAL & MGMT 11/09/2016 Aletta Edouard, MD [facility] INTERV RAD  . IR RADIOLOGIST EVAL & MGMT  03/01/2017  . LAPAROSCOPIC CHOLECYSTECTOMY  1999  . LIVER BIOPSY  2010  . RADIOACTIVE SEED IMPLANT  X 3   "to my liver"  . TUMOR EXCISION  01/2009   Carcinoil Resection   . TUMOR REMOVAL     from small intestine    Social History  Substance Use Topics  . Smoking status: Former Smoker    Packs/day: 1.50    Years: 35.00    Types: Cigarettes    Start date: 09/25/1959    Quit date: 12/21/1997  . Smokeless tobacco: Never Used  . Alcohol use 1.8 - 2.4 oz/week    3 - 4 Cans of beer per week     Comment: 4 beers weekly     Family History  Problem Relation Age of Onset  . Kidney disease Mother   . Hyperlipidemia Mother   . Hypertension Mother   . COPD Father   . Ulcerative colitis Daughter     Allergies  Allergen Reactions  . Iohexol Hives     Code: HIVES, Desc: PER [name] @ [facility], PT IS ALLERGIC TO CONTRAST DYE 10/02/08/RM  05/01/10...needs full premeds per our protocol w/ [facility]., Onset Date: [id number]     Medication list has been reviewed and updated.  Current Outpatient Prescriptions on File Prior to Visit  Medication Sig Dispense Refill  . amLODipine (NORVASC) 5 MG tablet Take 5 mg by mouth daily.  3  . aspirin EC 81 MG tablet Take 81 mg by mouth every morning.    [name] atorvastatin (LIPITOR) 80 MG tablet Take 1 tablet (80 mg total) by mouth daily at 6 PM. (Patient taking differently: Take 40 mg by mouth. Take 1/2 tablet (40 MG) by mouth daily at 6 PM.) 30 tablet 1  . Cholecalciferol 1000 UNITS tablet Take 1,000 Units by mouth daily.      . fluticasone (FLONASE) 50 MCG/ACT nasal spray Place 2 sprays into  both nostrils daily as needed (sinuses).   11  . hydrocortisone (ANUSOL-HC) 25 MG suppository INSERT 1 SUPPOSITORY RECTALLY TWICE A DAY AS NEEDED FOR HEMORRHOIDS 30 suppository 0  . Lanreotide Acetate (SOMATULINE DEPOT ) Inject 120 mcg into the skin every 28 (twenty-eight) days. Receives at Dr Antonieta Pert office    . lansoprazole (PREVACID) 30 MG capsule Take 1 capsule (30 mg total) by mouth daily at  12 noon. 90 capsule 3  . levothyroxine (SYNTHROID, LEVOTHROID) 50 MCG tablet Take 1 tablet (50 mcg total) by mouth daily. 90 tablet 1  . losartan (COZAAR) 100 MG tablet Take 1 tablet (100 mg total) by mouth at bedtime. 30 tablet 3  . metoprolol tartrate (LOPRESSOR) 25 MG tablet Take 1 tablet (25 mg total) by mouth 2 (two) times daily. 60 tablet There is5  . nitroGLYCERIN (NITROSTAT) 0.4 MG SL tablet Place 1 tablet (0.4 mg total) under the tongue every 5 (five) minutes x 3 doses as needed for chest pain. 25 tablet 4  . Omega-3 Fatty Acids (FISH OIL) 1000 MG CAPS Take 1 capsule by mouth daily. Reported on 12/30/2015    . sodium chloride (OCEAN) 0.65 % SOLN nasal spray Place 1 spray into both nostrils as needed for congestion.     . vitamin B-12 (CYANOCOBALAMIN) 1000 MCG tablet Take 1,000 mcg by mouth daily.    . diphenhydrAMINE (BENADRYL) 50 MG tablet Take 1 tablet (50 mg total) by mouth once. Take 1 hour prior to scan. 1 tablet 0  . PARoxetine (PAXIL) 10 MG tablet TAKE 1 TABLET BY MOUTH DAILY (Patient not taking: Reported on 06/15/2017) 90 tablet 3  . PARoxetine (PAXIL) 20 MG tablet Take 1 tablet (20 mg total) by mouth daily. (Patient not taking: Reported on 06/15/2017) 30 tablet 5  . predniSONE (DELTASONE) 50 MG tablet Take 50mg  at 13 hours, 7 hours, and 1 hour before scan (Patient not taking: Reported on 06/15/2017) 3 tablet 2  . ticagrelor (BRILINTA) 90 MG TABS tablet Take 1 tablet (90 mg total) by mouth 2 (two) times daily. 60 tablet 0   No current facility-administered medications on file prior to  visit.     Review of Systems:  As per HPI- otherwise negative.   Physical Examination: Vitals:   06/15/17 1445  BP: 136/67  Pulse: 66  Temp: 98.2 F (36.8 C)  SpO2: 100%   Vitals:   06/15/17 1445  Weight: 204 lb (92.5 kg)  Height: 5' 10.5" (1.791 m)   Body mass index is 28.86 kg/m. Ideal Body Weight: Weight in (lb) to have BMI = 25: 176.4  GEN: WDWN, NAD, Non-toxic, A & O x 3 HEENT: Atraumatic, Normocephalic. Neck supple. No masses, No LAD. Ears and Nose: No external deformity. CV: RRR, No M/G/R. No JVD. No thrill. No extra heart sounds. PULM: CTA B, no wheezes, crackles, rhonchi. No retractions. No resp. distress. No accessory muscle use. ABD: S, NT, ND, +BS. No rebound. No HSM. EXTR: No c/c/e NEURO Normal gait.  PSYCH: Normally interactive. Conversant. Not depressed or anxious appearing.  Calm demeanor.  Right foot: he has bruising and tenderness over the 3rd- 5th distal MT.  Able to stand and walk normally.  Tenderness of the foot is mild and less likely to represent fracture.  He has minimal soft tissue swelling  EKG: NSR.  Compared with past EKG- no acute or concerning change Assessment and Plan: Pre-syncope - Plan: Basic metabolic panel, CBC, EKG 68-LEXN, Troponin I, CANCELED: Troponin I, CANCELED: Troponin I  Right foot pain - Plan: DG Foot Complete Right history of NSTEMI with PCI a little over a year ago, negative stress myoview last November Here today following a pre-syncopal episode which occurred yesterday.  He felt faint, dizzy, and fell to the ground but did not have actual LOC per his report He now feels back to normal except for his right foot Plain film of foot as below:  Dg  Foot Complete Right  Result Date: 06/15/2017 CLINICAL DATA:  Right foot pain after a fall yesterday, bruising across the first through fifth metatarsal heads. Initial encounter. EXAM: RIGHT FOOT COMPLETE - 3+ VIEW COMPARISON:  None. FINDINGS: There are old fractures of the third  and fourth proximal phalangeal shafts. Degenerative changes in the fifth proximal interphalangeal joint. Forefoot soft tissue swelling without acute osseous abnormality. Calcaneal spurs. IMPRESSION: Forefoot soft tissue swelling without acute osseous abnormality. Electronically Signed   By: Lorin Picket M.D.   On: 06/15/2017 16:06   No acute fracture although he does appear to have an old MT fracture He denies CP or SOB, and VS are reassuring today EKG does not show any concerning change Obtain other labs as above including stat troponin.  Assuming troponin is negative plan to have him see Dr. Einar Gip within 1-2 weeks  Results for orders placed or performed in visit on 06/15/17  Troponin I  Result Value Ref Range   Troponin I <0.01 <=0.05 ng/mL     Signed Lamar Blinks, MD

## 2017-06-15 NOTE — Telephone Encounter (Signed)
Error/gd °

## 2017-06-15 NOTE — Patient Instructions (Addendum)
You do not have any acute fracture in your foot. It should improve over the next couple of weeks, but please let me know if not getting better I will be in touch with your labs today.  If your cardiac enzymes (troponin) are high I will call you right away!  Otherwise please give Dr. Irven Shelling office a call today or tomorrow and schedule a follow-up within a week or so If you have any recurrent episodes or chest pain please seek care Until you see Dr. Einar Gip avoid any strenyous physical exertion -

## 2017-06-16 ENCOUNTER — Telehealth: Payer: Self-pay | Admitting: *Deleted

## 2017-06-16 LAB — CBC
HCT: 36.5 % — ABNORMAL LOW (ref 39.0–52.0)
Hemoglobin: 11.8 g/dL — ABNORMAL LOW (ref 13.0–17.0)
MCHC: 32.2 g/dL (ref 30.0–36.0)
MCV: 84.5 fl (ref 78.0–100.0)
Platelets: 160 10*3/uL (ref 150.0–400.0)
RBC: 4.32 Mil/uL (ref 4.22–5.81)
RDW: 16.7 % — ABNORMAL HIGH (ref 11.5–15.5)
WBC: 8.9 10*3/uL (ref 4.0–10.5)

## 2017-06-16 LAB — BASIC METABOLIC PANEL
BUN: 11 mg/dL (ref 6–23)
CALCIUM: 9.1 mg/dL (ref 8.4–10.5)
CHLORIDE: 102 meq/L (ref 96–112)
CO2: 31 meq/L (ref 19–32)
CREATININE: 0.94 mg/dL (ref 0.40–1.50)
GFR: 84.24 mL/min (ref >60.00)
Glucose, Bld: 118 mg/dL — ABNORMAL HIGH (ref 70–99)
Potassium: 4.6 mEq/L (ref 3.5–5.1)
Sodium: 138 mEq/L (ref 135–145)

## 2017-06-16 NOTE — Telephone Encounter (Signed)
Normal lab results from [facility] on Troponin [<0.01] forwarded to provider/SLS 08/16

## 2017-06-21 NOTE — Progress Notes (Signed)
[place] at [facility] [address] [place], [place] [address] [phone]  Date:  06/23/2017   Name:  [name]   DOB:  1946/09/24   MRN:  [id number]  PCP:  Darreld Mclean, MD    Chief Complaint: Follow-up (Pt states he's having "tenderness around his liver") and Fall (Pt states he fell last week. F/U)   History of Present Illness:  Brian Mays is a 71 y.o. very pleasant male patient who presents with the following:  Here today for a CPE I saw him on 8/15 for a pre-syncopal episode:  Yesterday he had "a light headed spell" and fell while he was at work, injured his right foot somehow.  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  He did not feel like he over-did it really, he did not have to work too hard.  However he then started to feel kind of strange and lightheaded.  Went up to the employee office and had a near syncopal event- he felt like he might pass out and fell to the ground.  However he was never unconscious  EMS was called and checked him and he did not go to the ER, his wife came and drove him home  He did not hit his head, he did not have actual LOC. He did not have any slurred speech, facial drooping or any numbness He did twist his right foot when he fell and it is bruised and a bit tender on the dorsum.  He is able to walk and does not think it is broken  He has had this sort of symptoms to a lesser extent in the past when he over-exerts himself He also did have an MI last year.  NSTEMI- he was treated with PCI  A stat troponin was negative, and we planned to have him see Dr. Einar Gip for follow-up soon Also got the following labs at that visit Note mild anemia- he did have a colonoscopy in 2017  We did see him about 10 days ago for syncope as above.  He has not had any further episodes like this since then and has been  feeling pretty well He has noted some right flank pain   We noted mild anemia recently He has not seen any blood in his stool Last colonoscopy just over a year ago - looked ok  He is seeing Dr. Marin Olp for his metastatic low grade neuroendocrine tumor with hepatic mets.  He will see him tomorrow They are thinking about having him start on lut[place]hera  He will get his flu shot today  He is not fasting today- he had just some oatmeal and juice Lab Results  Component Value Date   TSH 1.978 10/01/2016     Office Visit on 06/15/2017  Component Date Value Ref Range Status  . Sodium 06/15/2017 138  135 - 145 mEq/L Final  . Potassium 06/15/2017 4.6  3.5 - 5.1 mEq/L Final  . Chloride 06/15/2017 102  96 - 112 mEq/L Final  . CO2 06/15/2017 31  19 - 32 mEq/L Final  . Glucose, Bld 06/15/2017 118* 70 - 99 mg/dL Final  . BUN 06/15/2017 11  6 - 23 mg/dL Final  . Creatinine, Ser 06/15/2017 0.94  0.40 - 1.50 mg/dL Final  . Calcium 06/15/2017 9.1  8.4 - 10.5 mg/dL Final  .  GFR 06/15/2017 84.24  >60.00 mL/min Final  . WBC 06/15/2017 8.9  4.0 - 10.5 K/uL Final  . RBC 06/15/2017 4.32  4.22 - 5.81 Mil/uL Final  . Platelets 06/15/2017 160.0  150.0 - 400.0 K/uL Final  . Hemoglobin 06/15/2017 11.8* 13.0 - 17.0 g/dL Final  . HCT 06/15/2017 36.5* 39.0 - 52.0 % Final  . MCV 06/15/2017 84.5  78.0 - 100.0 fl Final  . MCHC 06/15/2017 32.2  30.0 - 36.0 g/dL Final  . RDW 06/15/2017 16.7* 11.5 - 15.5 % Final  . Troponin I 06/15/2017 <0.01  <=0.05 ng/mL Final   Comment:   In accord with published recommendations, serial testing of Troponin I at intervals of 2 to 4 hours for up to 12 to 24 hours is suggested in order to corroborate a single Troponin I result. An elevated troponin alone is not sufficient to make the diagnosis of MI. ** Please note change in reference range(s). **       Patient Active Problem List   Diagnosis Date Noted  . CAD (coronary artery disease) 09/21/2016  . Unstable angina ([place])  02/09/2016  . Vitamin D deficiency 03/11/2015  . Metastatic malignant neuroendocrine tumor to liver ([place]) 02/14/2015  . Hyperglycemia 04/03/2012  . Diarrhea 03/29/2012  . B12 deficiency 04/19/2011  . TOBACCO USE, QUIT 10/13/2009  . Unspecified vitamin D deficiency 06/19/2009  . Neoplasm by body site 12/23/2008  . DIVERTICULOSIS, COLON 11/08/2008  . ABDOMINAL PAIN 10/02/2008  . Hypothyroidism 12/13/2007  . HEMORRHOIDS, NOS 12/13/2007  . COLONIC POLYPS, HX OF 12/13/2007  . Essential hypertension 09/15/2007  . GERD 09/15/2007  . PANCREATITIS, CHRONIC 09/15/2007  . CARDIAC MURMUR 09/15/2007  . SNORING 09/15/2007    Past Medical History:  Diagnosis Date  . Anemia    in past  . Colon polyps   . Diverticulosis   . Gallstones   . GERD (gastroesophageal reflux disease)   . Heart attack ([place])    mild, Spring 2017  . Heart murmur   . Hemorrhoid   . Hiatal hernia   . HTN (hypertension)   . Hypothyroidism   . Metastatic carcinoma ([place]) 2010   Dr Jonette Eva  . Pancreatitis 1998   chronic    Past Surgical History:  Procedure Laterality Date  . APPENDECTOMY  1962  . CARDIAC CATHETERIZATION N/A 02/10/2016   Procedure: Left Heart Cath and Coronary Angiography;  Surgeon: Adrian Prows, MD;  Location: [facility];  Service: Cardiovascular;  Laterality: N/A;  . CARDIAC CATHETERIZATION  02/10/2016   Procedure: Coronary/Graft Atherectomy;  Surgeon: Adrian Prows, MD;  Location: [facility];  Service: Cardiovascular;;  . CARDIAC CATHETERIZATION  02/10/2016   Procedure: Coronary Stent Intervention;  Surgeon: Adrian Prows, MD;  Location: [facility];  Service: Cardiovascular;;  . CATARACT EXTRACTION W/ INTRAOCULAR LENS  IMPLANT, BILATERAL Bilateral   . IR GENERIC HISTORICAL  12/30/2015   IR RADIOLOGIST EVAL & MGMT 12/30/2015 Aletta Edouard, MD [facility] INTERV RAD  . IR GENERIC HISTORICAL  11/09/2016   IR RADIOLOGIST EVAL & MGMT 11/09/2016 Aletta Edouard, MD [facility] INTERV RAD  . IR  RADIOLOGIST EVAL & MGMT  03/01/2017  . LAPAROSCOPIC CHOLECYSTECTOMY  1999  . LIVER BIOPSY  2010  . RADIOACTIVE SEED IMPLANT  X 3   "to my liver"  . TUMOR EXCISION  01/2009   Carcinoil Resection   . TUMOR REMOVAL     from small intestine    Social History  Substance Use Topics  . Smoking status:  Former Smoker    Packs/day: 1.50    Years: 35.00    Types: Cigarettes    Start date: 09/25/1959    Quit date: 12/21/1997  . Smokeless tobacco: Never Used  . Alcohol use 1.8 - 2.4 oz/week    3 - 4 Cans of beer per week     Comment: 4 beers weekly     Family History  Problem Relation Age of Onset  . Kidney disease Mother   . Hyperlipidemia Mother   . Hypertension Mother   . COPD Father   . Ulcerative colitis Daughter     Allergies  Allergen Reactions  . Iohexol Hives     Code: HIVES, Desc: PER [name] @ [facility], PT IS ALLERGIC TO CONTRAST DYE 10/02/08/RM  05/01/10...needs full premeds per our protocol w/ [facility]., Onset Date: 93810175     Medication list has been reviewed and updated.  Current Outpatient Prescriptions on File Prior to Visit  Medication Sig Dispense Refill  . amLODipine (NORVASC) 5 MG tablet Take 5 mg by mouth daily.  3  . aspirin EC 81 MG tablet Take 81 mg by mouth every morning.    [name] atorvastatin (LIPITOR) 80 MG tablet Take 1 tablet (80 mg total) by mouth daily at 6 PM. (Patient taking differently: Take 40 mg by mouth. Take 1/2 tablet (40 MG) by mouth daily at 6 PM.) 30 tablet 1  . Cholecalciferol 1000 UNITS tablet Take 1,000 Units by mouth daily.      . fluticasone (FLONASE) 50 MCG/ACT nasal spray Place 2 sprays into both nostrils daily as needed (sinuses).   11  . hydrocortisone (ANUSOL-HC) 25 MG suppository INSERT 1 SUPPOSITORY RECTALLY TWICE A DAY AS NEEDED FOR HEMORRHOIDS 30 suppository 0  . Lanreotide Acetate (SOMATULINE DEPOT [place]) Inject 120 mcg into the skin every 28 (twenty-eight) days. Receives at Dr Antonieta Pert office    . lansoprazole (PREVACID) 30 MG  capsule Take 1 capsule (30 mg total) by mouth daily at 12 noon. 90 capsule 3  . levothyroxine (SYNTHROID, LEVOTHROID) 50 MCG tablet Take 1 tablet (50 mcg total) by mouth daily. 90 tablet 1  . losartan (COZAAR) 100 MG tablet Take 1 tablet (100 mg total) by mouth at bedtime. 30 tablet 3  . metoprolol tartrate (LOPRESSOR) 25 MG tablet Take 1 tablet (25 mg total) by mouth 2 (two) times daily. 60 tablet There is5  . nitroGLYCERIN (NITROSTAT) 0.4 MG SL tablet Place 1 tablet (0.4 mg total) under the tongue every 5 (five) minutes x 3 doses as needed for chest pain. 25 tablet 4  . Omega-3 Fatty Acids (FISH OIL) 1000 MG CAPS Take 1 capsule by mouth daily. Reported on 12/30/2015    . PARoxetine (PAXIL) 10 MG tablet TAKE 1 TABLET BY MOUTH DAILY 90 tablet 3  . PARoxetine (PAXIL) 20 MG tablet Take 1 tablet (20 mg total) by mouth daily. 30 tablet 5  . predniSONE (DELTASONE) 50 MG tablet Take 50mg  at 13 hours, 7 hours, and 1 hour before scan 3 tablet 2  . sodium chloride (OCEAN) 0.65 % SOLN nasal spray Place 1 spray into both nostrils as needed for congestion.     . ticagrelor (BRILINTA) 90 MG TABS tablet Take 1 tablet (90 mg total) by mouth 2 (two) times daily. 60 tablet 0  . vitamin B-12 (CYANOCOBALAMIN) 1000 MCG tablet Take 1,000 mcg by mouth daily.    . diphenhydrAMINE (BENADRYL) 50 MG tablet Take 1 tablet (50 mg total) by mouth once. Take 1  hour prior to scan. 1 tablet 0   No current facility-administered medications on file prior to visit.     Review of Systems:  As per HPI- otherwise negative.   Physical Examination: Vitals:   06/23/17 1044  BP: (!) 145/72  Pulse: 63  Temp: 98.2 F (36.8 C)  SpO2: 100%   Vitals:   06/23/17 1044  Weight: 207 lb (93.9 kg)  Height: 6' (1.829 m)   Body mass index is 28.07 kg/m. Ideal Body Weight: Weight in (lb) to have BMI = 25: 183.9  GEN: WDWN, NAD, Non-toxic, A & O x 3, looks well HEENT: Atraumatic, Normocephalic. Neck supple. No masses, No LAD.   Bilateral TM wnl, oropharynx normal.  PEERL,EOMI.   Ears and Nose: No external deformity. CV: RRR, No M/G/R. No JVD. No thrill. No extra heart sounds. PULM: CTA B, no wheezes, crackles, rhonchi. No retractions. No resp. distress. No accessory muscle use. ABD: S, NT, ND, +BS. No rebound. No HSM.  Belly is soft and non- tender EXTR: No c/c/e NEURO Normal gait.  PSYCH: Normally interactive. Conversant. Not depressed or anxious appearing.  Calm demeanor.    Assessment and Plan: Physical exam  Pre-diabetes - Plan: Hemoglobin A1c  Screening for prostate cancer - Plan: PSA  Encounter for hepatitis C screening test for low risk patient - Plan: Hepatitis C antibody  Essential hypertension, benign  Mixed hyperlipidemia - Plan: Lipid panel  Mild anemia - Plan: IFOBT POC (occult bld, rslt in office)  Acquired hypothyroidism - Plan: TSH  Influenza vaccine administered - Plan: Flu vaccine HIGH DOSE PF (Fluzone High dose)  Here today for a CPE Flu shot today BP under ok control Received word back from Dr. Marin Olp- we do suspect his mild anemia is due to his chronic disease Will plan further follow- up pending labs.   Signed Lamar Blinks, MD

## 2017-06-23 ENCOUNTER — Encounter: Payer: Self-pay | Admitting: Family Medicine

## 2017-06-23 ENCOUNTER — Ambulatory Visit (INDEPENDENT_AMBULATORY_CARE_PROVIDER_SITE_OTHER): Payer: Medicare HMO | Admitting: Family Medicine

## 2017-06-23 VITALS — BP 145/72 | HR 63 | Temp 98.2°F | Ht 72.0 in | Wt 207.0 lb

## 2017-06-23 DIAGNOSIS — E782 Mixed hyperlipidemia: Secondary | ICD-10-CM | POA: Diagnosis not present

## 2017-06-23 DIAGNOSIS — Z23 Encounter for immunization: Secondary | ICD-10-CM | POA: Diagnosis not present

## 2017-06-23 DIAGNOSIS — Z125 Encounter for screening for malignant neoplasm of prostate: Secondary | ICD-10-CM

## 2017-06-23 DIAGNOSIS — I10 Essential (primary) hypertension: Secondary | ICD-10-CM | POA: Diagnosis not present

## 2017-06-23 DIAGNOSIS — Z1159 Encounter for screening for other viral diseases: Secondary | ICD-10-CM | POA: Diagnosis not present

## 2017-06-23 DIAGNOSIS — E039 Hypothyroidism, unspecified: Secondary | ICD-10-CM | POA: Diagnosis not present

## 2017-06-23 DIAGNOSIS — Z0000 Encounter for general adult medical examination without abnormal findings: Secondary | ICD-10-CM | POA: Diagnosis not present

## 2017-06-23 DIAGNOSIS — D649 Anemia, unspecified: Secondary | ICD-10-CM

## 2017-06-23 DIAGNOSIS — R7303 Prediabetes: Secondary | ICD-10-CM | POA: Diagnosis not present

## 2017-06-23 LAB — LIPID PANEL
CHOL/HDL RATIO: 3
CHOLESTEROL: 86 mg/dL (ref 0–200)
HDL: 30.7 mg/dL — ABNORMAL LOW (ref >39.00)
LDL CALC: 37 mg/dL (ref 0–99)
NonHDL: 55.59
Triglycerides: 91 mg/dL (ref 0.0–149.0)
VLDL: 18.2 mg/dL (ref 0.0–40.0)

## 2017-06-23 LAB — HEPATITIS C ANTIBODY: HCV AB: NONREACTIVE

## 2017-06-23 LAB — HEMOGLOBIN A1C: Hgb A1c MFr Bld: 6.2 % (ref 4.6–6.5)

## 2017-06-23 LAB — PSA: PSA: 0.52 ng/mL (ref 0.10–4.00)

## 2017-06-23 LAB — TSH: TSH: 3.6 u[IU]/mL (ref 0.35–4.50)

## 2017-06-23 NOTE — Patient Instructions (Addendum)
It was a pleasure to see you today as always!  You got your flu shot today I will be in touch with your labs asap Please complete the stool cards (to test for blood) at home at your convenience and send them back in to the office I will ask Dr. Marin Olp about your mild anemia; I suspect it is just part of your overall disease, but we will want to monitor it and make sure you are not losing any blood in your stool

## 2017-06-24 ENCOUNTER — Ambulatory Visit (HOSPITAL_BASED_OUTPATIENT_CLINIC_OR_DEPARTMENT_OTHER): Payer: Medicare HMO | Admitting: Hematology & Oncology

## 2017-06-24 ENCOUNTER — Ambulatory Visit (HOSPITAL_BASED_OUTPATIENT_CLINIC_OR_DEPARTMENT_OTHER): Payer: Medicare HMO

## 2017-06-24 ENCOUNTER — Encounter: Payer: Self-pay | Admitting: Family Medicine

## 2017-06-24 ENCOUNTER — Other Ambulatory Visit (HOSPITAL_BASED_OUTPATIENT_CLINIC_OR_DEPARTMENT_OTHER): Payer: Medicare HMO

## 2017-06-24 VITALS — BP 145/64 | HR 59 | Temp 98.2°F | Resp 18 | Wt 203.0 lb

## 2017-06-24 DIAGNOSIS — C7A8 Other malignant neuroendocrine tumors: Secondary | ICD-10-CM | POA: Diagnosis not present

## 2017-06-24 DIAGNOSIS — C7B8 Other secondary neuroendocrine tumors: Secondary | ICD-10-CM

## 2017-06-24 LAB — CMP (CANCER CENTER ONLY)
ALBUMIN: 3.2 g/dL — AB (ref 3.3–5.5)
ALK PHOS: 71 U/L (ref 26–84)
ALT: 15 U/L (ref 10–47)
AST: 31 U/L (ref 11–38)
BILIRUBIN TOTAL: 1.1 mg/dL (ref 0.20–1.60)
BUN, Bld: 12 mg/dL (ref 7–22)
CALCIUM: 8.9 mg/dL (ref 8.0–10.3)
CO2: 28 mEq/L (ref 18–33)
Chloride: 104 mEq/L (ref 98–108)
Creat: 1.1 mg/dl (ref 0.6–1.2)
Glucose, Bld: 105 mg/dL (ref 73–118)
POTASSIUM: 3.8 meq/L (ref 3.3–4.7)
Sodium: 141 mEq/L (ref 128–145)
TOTAL PROTEIN: 8.1 g/dL (ref 6.4–8.1)

## 2017-06-24 LAB — CBC WITH DIFFERENTIAL (CANCER CENTER ONLY)
BASO#: 0 10*3/uL (ref 0.0–0.2)
BASO%: 0.5 % (ref 0.0–2.0)
EOS%: 4.8 % (ref 0.0–7.0)
Eosinophils Absolute: 0.3 10*3/uL (ref 0.0–0.5)
HEMATOCRIT: 34.4 % — AB (ref 38.7–49.9)
HEMOGLOBIN: 11.1 g/dL — AB (ref 13.0–17.1)
LYMPH#: 1.2 10*3/uL (ref 0.9–3.3)
LYMPH%: 18.4 % (ref 14.0–48.0)
MCH: 27.3 pg — ABNORMAL LOW (ref 28.0–33.4)
MCHC: 32.3 g/dL (ref 32.0–35.9)
MCV: 85 fL (ref 82–98)
MONO#: 0.5 10*3/uL (ref 0.1–0.9)
MONO%: 8.3 % (ref 0.0–13.0)
NEUT#: 4.4 10*3/uL (ref 1.5–6.5)
NEUT%: 68 % (ref 40.0–80.0)
Platelets: 212 10*3/uL (ref 145–400)
RBC: 4.06 10*6/uL — ABNORMAL LOW (ref 4.20–5.70)
RDW: 15.3 % (ref 11.1–15.7)
WBC: 6.4 10*3/uL (ref 4.0–10.0)

## 2017-06-24 LAB — LACTATE DEHYDROGENASE: LDH: 246 U/L — AB (ref 125–245)

## 2017-06-24 MED ORDER — LANREOTIDE ACETATE 120 MG/0.5ML ~~LOC~~ SOLN
SUBCUTANEOUS | Status: AC
  Filled 2017-06-24: qty 120

## 2017-06-24 MED ORDER — LANREOTIDE ACETATE 120 MG/0.5ML ~~LOC~~ SOLN
120.0000 mg | Freq: Once | SUBCUTANEOUS | Status: AC
  Administered 2017-06-24: 120 mg via SUBCUTANEOUS

## 2017-06-24 NOTE — Progress Notes (Signed)
Hematology and Oncology Follow Up Visit  Brian Mays [id number] Dec 27, 1945 71 y.o. 06/24/2017   Principle Diagnosis:  Metastatic low grade neuroendocrine tumor-hepatic metastases  Current Therapy:   S/p third yttrium-90 intrahepatic therapy - November 2016 Somatuline 120 mg monthly    Interim History: [name] is back for follow-up. He will see interventional radiology next week. Hopefully, he will be able to get the Lutathera injection. I think this would be very helpful for him.  He's been complaining of some lightheadedness. He's been fairly active. He's been on bike rides. He says after staying active for a while, he gets lightheaded. This lasts a few minutes and then gets better.  He is seeing his other doctors for this. So far, nothing has been found.  He's had no abdominal pain. There's been no diarrhea. He has some occasional flushing.  His last chromogranin A level in July was 12. This is holding pretty steady.  He's had no nausea or vomiting. He's had no cough. There's no shortness of breath.  He's had no rashes. His been no leg swelling.  Overall, his performance status is ECOG 0.  Medications:  Allergies as of 06/24/2017      Reactions   Iohexol Hives    Code: HIVES, Desc: PER [name] @ [facility], PT IS ALLERGIC TO CONTRAST DYE 10/02/08/RM  05/01/10...needs full premeds per our protocol w/ [facility]., Onset Date: [id number]      Medication List       Accurate as of 06/24/17 11:50 AM. Always use your most recent med list.          amLODipine 5 MG tablet Commonly known as:  NORVASC Take 5 mg by mouth daily.   aspirin EC 81 MG tablet Take 81 mg by mouth every morning.   atorvastatin 80 MG tablet Commonly known as:  LIPITOR Take 1 tablet (80 mg total) by mouth daily at 6 PM.   Cholecalciferol 1000 units tablet Take 1,000 Units by mouth daily.   diphenhydrAMINE 50 MG tablet Commonly known as:  BENADRYL Take 1 tablet (50 mg total) by mouth once. Take 1  hour prior to scan.   Fish Oil 1000 MG Caps Take 1 capsule by mouth daily. Reported on 12/30/2015   fluticasone 50 MCG/ACT nasal spray Commonly known as:  FLONASE Place 2 sprays into both nostrils daily as needed (sinuses).   hydrocortisone 25 MG suppository Commonly known as:  ANUSOL-HC INSERT 1 SUPPOSITORY RECTALLY TWICE A DAY AS NEEDED FOR HEMORRHOIDS   lansoprazole 30 MG capsule Commonly known as:  PREVACID Take 1 capsule (30 mg total) by mouth daily at 12 noon.   levothyroxine 50 MCG tablet Commonly known as:  SYNTHROID, LEVOTHROID Take 1 tablet (50 mcg total) by mouth daily.   losartan 100 MG tablet Commonly known as:  COZAAR Take 1 tablet (100 mg total) by mouth at bedtime.   metoprolol tartrate 25 MG tablet Commonly known as:  LOPRESSOR Take 1 tablet (25 mg total) by mouth 2 (two) times daily.   nitroGLYCERIN 0.4 MG SL tablet Commonly known as:  NITROSTAT Place 1 tablet (0.4 mg total) under the tongue every 5 (five) minutes x 3 doses as needed for chest pain.   predniSONE 50 MG tablet Commonly known as:  DELTASONE Take 50mg  at 13 hours, 7 hours, and 1 hour before scan   sodium chloride 0.65 % Soln nasal spray Commonly known as:  OCEAN Place 1 spray into both nostrils as needed for congestion.  SOMATULINE DEPOT [place] Inject 120 mcg into the skin every 28 (twenty-eight) days. Receives at Dr Antonieta Pert office   ticagrelor 90 MG Tabs tablet Commonly known as:  BRILINTA Take 1 tablet (90 mg total) by mouth 2 (two) times daily.   vitamin B-12 1000 MCG tablet Commonly known as:  CYANOCOBALAMIN Take 1,000 mcg by mouth daily.       Allergies:  Allergies  Allergen Reactions  . Iohexol Hives     Code: HIVES, Desc: PER [name] @ [facility], PT IS ALLERGIC TO CONTRAST DYE 10/02/08/RM  05/01/10...needs full premeds per our protocol w/ [facility]., Onset Date: [id number]     Past Medical History, Surgical history, Social history, and Family History were reviewed and  updated.  Review of Systems: Review of Systems  Constitutional: Negative for appetite change, fatigue, fever and unexpected weight change.  HENT:   Negative for lump/mass, mouth sores, sore throat and trouble swallowing.   Respiratory: Negative for cough, hemoptysis and shortness of breath.   Cardiovascular: Negative for leg swelling and palpitations.  Gastrointestinal: Negative for abdominal distention, abdominal pain, blood in stool, constipation, diarrhea, nausea and vomiting.  Genitourinary: Negative for bladder incontinence, dysuria, frequency and hematuria.   Musculoskeletal: Negative for arthralgias, back pain, gait problem and myalgias.  Skin: Negative for itching and rash.  Neurological: Negative for dizziness, extremity weakness, gait problem, headaches, numbness, seizures and speech difficulty.  Hematological: Does not bruise/bleed easily.  Psychiatric/Behavioral: Negative for depression and sleep disturbance. The patient is not nervous/anxious.      Physical Exam:  weight is 203 lb (92.1 kg). His oral temperature is 98.2 F (36.8 C). His blood pressure is 145/64 (abnormal) and his pulse is 59 (abnormal). His respiration is 18 and oxygen saturation is 99%.   Wt Readings from Last 3 Encounters:  06/24/17 203 lb (92.1 kg)  06/23/17 207 lb (93.9 kg)  06/15/17 204 lb (92.5 kg)    Physical Exam  Constitutional: He is oriented to person, place, and time.  HENT:  Head: Normocephalic and atraumatic.  Mouth/Throat: Oropharynx is clear and moist.  Eyes: Pupils are equal, round, and reactive to light. EOM are normal.  Neck: Normal range of motion.  Cardiovascular: Normal rate, regular rhythm and normal heart sounds.   Pulmonary/Chest: Effort normal and breath sounds normal.  Abdominal: Soft. Bowel sounds are normal.  Musculoskeletal: Normal range of motion. He exhibits no edema, tenderness or deformity.  Lymphadenopathy:    He has no cervical adenopathy.  Neurological: He is  alert and oriented to person, place, and time.  Skin: Skin is warm and dry. No rash noted. No erythema.  Psychiatric: He has a normal mood and affect. His behavior is normal. Judgment and thought content normal.  Vitals reviewed. .   Lab Results  Component Value Date   WBC 6.4 06/24/2017   HGB 11.1 (L) 06/24/2017   HCT 34.4 (L) 06/24/2017   MCV 85 06/24/2017   PLT 212 06/24/2017   No results found for: FERRITIN, IRON, TIBC, UIBC, IRONPCTSAT Lab Results  Component Value Date   RBC 4.06 (L) 06/24/2017   No results found for: KPAFRELGTCHN, LAMBDASER, KAPLAMBRATIO No results found for: IGGSERUM, IGA, IGMSERUM No results found for: Ronnald Ramp, A1GS, A2GS, Violet Baldy MSPIKE, SPEI   Chemistry      Component Value Date/Time   NA 138 06/15/2017 1621   NA 143 05/26/2017 1104   NA 141 07/23/2016 1255   K 4.6 06/15/2017 1621   K 4.5 05/26/2017 1104  K 4.1 07/23/2016 1255   CL 102 06/15/2017 1621   CL 105 05/26/2017 1104   CO2 31 06/15/2017 1621   CO2 30 05/26/2017 1104   CO2 27 07/23/2016 1255   BUN 11 06/15/2017 1621   BUN 10 05/26/2017 1104   BUN 13.8 07/23/2016 1255   CREATININE 0.94 06/15/2017 1621   CREATININE 1.0 05/26/2017 1104   CREATININE 1.0 07/23/2016 1255      Component Value Date/Time   CALCIUM 9.1 06/15/2017 1621   CALCIUM 9.3 05/26/2017 1104   CALCIUM 9.2 07/23/2016 1255   ALKPHOS 57 05/26/2017 1104   ALKPHOS 77 07/23/2016 1255   AST 35 05/26/2017 1104   AST 23 07/23/2016 1255   ALT 14 05/26/2017 1104   ALT 13 07/23/2016 1255   BILITOT 1.10 05/26/2017 1104   BILITOT 1.13 07/23/2016 1255     Impression and Plan: Mr. Pine is A 71 year old white male. He has metastatic low-grade neuroendocrine carcinoma. He has hepatic metastases.  Again, the Lutathera should be a good intervention for him. He is looking for to having this done.  We will go ahead and give him the Somatuline today.  I will go ahead and plan to get him back in  another month or so. We will have to watch him closely.  Burney Gauze, MD  8/24/201811:50 AM

## 2017-06-24 NOTE — Patient Instructions (Signed)
Lanreotide injection What is this medicine? LANREOTIDE (lan REE oh tide) is used to reduce blood levels of growth hormone in patients with a condition called acromegaly. It also works to slow or stop tumor growth in patients with neuroendocrine tumors and treat carcinoid syndrome. This medicine may be used for other purposes; ask your health care provider or pharmacist if you have questions. COMMON BRAND NAME(S): Somatuline [place] What should I tell my health care provider before I take this medicine? They need to know if you have any of these conditions: -diabetes -gallbladder disease -heart disease -kidney disease -liver disease -thyroid disease -an unusual or allergic reaction to lanreotide, other medicines, foods, dyes, or preservatives -pregnant or trying to get pregnant -breast-feeding How should I use this medicine? This medicine is for injection under the skin. It is given by a health care professional in a hospital or clinic setting. Contact your pediatrician or health care professional regarding the use of this medicine in children. Special care may be needed. Overdosage: If you think you have taken too much of this medicine contact a poison control center or emergency room at once. NOTE: This medicine is only for you. Do not share this medicine with others. What if I miss a dose? It is important not to miss your dose. Call your doctor or health care professional if you are unable to keep an appointment. What may interact with this medicine? This medicine may interact with the following medications: -bromocriptine -cyclosporine -certain medicines for blood pressure, heart disease, irregular heart beat -certain medicines for diabetes -quinidine -terfenadine This list may not describe all possible interactions. Give your health care provider a list of all the medicines, herbs, non-prescription drugs, or dietary supplements you use. Also tell them if you smoke, drink alcohol, or  use illegal drugs. Some items may interact with your medicine. What should I watch for while using this medicine? Tell your doctor or healthcare professional if your symptoms do not start to get better or if they get worse. Visit your doctor or health care professional for regular checks on your progress. Your condition will be monitored carefully while you are receiving this medicine. You may need blood work done while you are taking this medicine. Women should inform their doctor if they wish to become pregnant or think they might be pregnant. There is a potential for serious side effects to an unborn child. Talk to your health care professional or pharmacist for more information. Do not breast-feed an infant while taking this medicine or for 6 months after stopping it. This medicine has caused ovarian failure in some women. This medicine may interfere with the ability to have a child. Talk with your doctor or health care professional if you are concerned about your fertility. What side effects may I notice from receiving this medicine? Side effects that you should report to your doctor or health care professional as soon as possible: -allergic reactions like skin rash, itching or hives, swelling of the face, lips, or tongue -increased blood pressure -severe stomach pain -signs and symptoms of high blood sugar such as dizziness; dry mouth; dry skin; fruity breath; nausea; stomach pain; increased hunger or thirst; increased urination -signs and symptoms of low blood sugar such as feeling anxious; confusion; dizziness; increased hunger; unusually weak or tired; sweating; shakiness; cold; irritable; headache; blurred vision; fast heartbeat; loss of consciousness -unusually slow heartbeat Side effects that usually do not require medical attention (report to your doctor or health care professional if they continue   or are bothersome): -constipation -diarrhea -dizziness -headache -muscle pain -muscle  spasms -nausea -pain, redness, or irritation at site where injected This list may not describe all possible side effects. Call your doctor for medical advice about side effects. You may report side effects to FDA at 1-800-FDA-1088. Where should I keep my medicine? This drug is given in a hospital or clinic and will not be stored at home. NOTE: This sheet is a summary. It may not cover all possible information. If you have questions about this medicine, talk to your doctor, pharmacist, or health care provider.  2018 Elsevier/Gold Standard (2016-07-23 10:33:47)  

## 2017-06-28 LAB — CHROMOGRANIN A: Chromogranin A: 10 nmol/L — ABNORMAL HIGH (ref 0–5)

## 2017-06-29 ENCOUNTER — Encounter (HOSPITAL_COMMUNITY)
Admission: RE | Admit: 2017-06-29 | Discharge: 2017-06-29 | Disposition: A | Discharge status: Home / Self Care | Payer: Medicare HMO | Source: Ambulatory Visit | Attending: Diagnostic Radiology | Admitting: Diagnostic Radiology

## 2017-06-29 DIAGNOSIS — C7A8 Other malignant neuroendocrine tumors: Secondary | ICD-10-CM | POA: Insufficient documentation

## 2017-06-29 DIAGNOSIS — C7A1 Malignant poorly differentiated neuroendocrine tumors: Secondary | ICD-10-CM | POA: Diagnosis not present

## 2017-06-29 HISTORY — PX: IR RADIOLOGIST EVAL & MGMT: IMG5224

## 2017-06-29 NOTE — Consult Note (Signed)
Chief Complaint: Patient was seen in consultation today for well differentiated neuroendocrine tumor with metastasis. Evaluation for Lutathera (Lu 177 DOTATATE) Peptide Receptor Radiotherapy  at the request of Lattie Haw, MD.     Referring Physician(s):  Ennever  Patient Status: [facility]-Outpatient  History of Present Illness: Brian Mays is a 71 y.o. male with Initial presentation of neuroendocrine tumor in 2010 with central mesenteric lesion and liver metastasis.  Patient has received 3 treatments with yttrium 90 tumor directed radiotherapy to the liver from 2011 to 2015 as well as liver ablation.  Patient maintained on monthly long acting Sandostatin therapy (Samatuline).  The patient has relatively stable disease and presents for peptide receptor radiotherapy.  Patient's tumor is avid for the somatostatin avid radiotrace as demonstrated on Ga 68 DOTATATE PET scan of 03/17/2017 which demonstrated uptake in multiple liver lesions and within the central mesenteric mass.     Past Medical History:  Diagnosis Date  . Anemia    in past  . Colon polyps   . Diverticulosis   . Gallstones   . GERD (gastroesophageal reflux disease)   . Heart attack ([place])    mild, Spring 2017  . Heart murmur   . Hemorrhoid   . Hiatal hernia   . HTN (hypertension)   . Hypothyroidism   . Metastatic carcinoma ([place]) 2010   Dr Jonette Eva  . Pancreatitis 1998   chronic    Past Surgical History:  Procedure Laterality Date  . APPENDECTOMY  1962  . CARDIAC CATHETERIZATION N/A 02/10/2016   Procedure: Left Heart Cath and Coronary Angiography;  Surgeon: Adrian Prows, MD;  Location: [facility];  Service: Cardiovascular;  Laterality: N/A;  . CARDIAC CATHETERIZATION  02/10/2016   Procedure: Coronary/Graft Atherectomy;  Surgeon: Adrian Prows, MD;  Location: [facility];  Service: Cardiovascular;;  . CARDIAC CATHETERIZATION  02/10/2016   Procedure: Coronary Stent Intervention;  Surgeon: Adrian Prows, MD;   Location: [facility];  Service: Cardiovascular;;  . CATARACT EXTRACTION W/ INTRAOCULAR LENS  IMPLANT, BILATERAL Bilateral   . IR GENERIC HISTORICAL  12/30/2015   IR RADIOLOGIST EVAL & MGMT 12/30/2015 Aletta Edouard, MD [facility] INTERV RAD  . IR GENERIC HISTORICAL  11/09/2016   IR RADIOLOGIST EVAL & MGMT 11/09/2016 Aletta Edouard, MD [facility] INTERV RAD  . IR RADIOLOGIST EVAL & MGMT  03/01/2017  . LAPAROSCOPIC CHOLECYSTECTOMY  1999  . LIVER BIOPSY  2010  . RADIOACTIVE SEED IMPLANT  X 3   "to my liver"  . TUMOR EXCISION  01/2009   Carcinoil Resection   . TUMOR REMOVAL     from small intestine    Allergies: Iohexol  Medications: Prior to Admission medications   Medication Sig Start Date End Date Taking? Authorizing Provider  amLODipine (NORVASC) 5 MG tablet Take 5 mg by mouth daily. 03/16/17   [provider]  aspirin EC 81 MG tablet Take 81 mg by mouth every morning.    [provider]  atorvastatin (LIPITOR) 80 MG tablet Take 1 tablet (80 mg total) by mouth daily at 6 PM. Patient taking differently: Take 40 mg by mouth. Take 1/2 tablet (40 MG) by mouth daily at 6 PM. 02/11/16   Adrian Prows, MD  Cholecalciferol 1000 UNITS tablet Take 1,000 Units by mouth daily.      [provider]  diphenhydrAMINE (BENADRYL) 50 MG tablet Take 1 tablet (50 mg total) by mouth once. Take 1 hour prior to scan. 02/18/17 02/18/17  [place],  Holli Humbles, NP  fluticasone [facility]) 50 MCG/ACT nasal spray Place 2 sprays into both nostrils daily as needed (sinuses).  01/06/15   [provider]  hydrocortisone (ANUSOL-HC) 25 MG suppository INSERT 1 SUPPOSITORY RECTALLY TWICE A DAY AS NEEDED FOR HEMORRHOIDS 02/07/15   [place], Holli Humbles, NP  Lanreotide Acetate (SOMATULINE DEPOT [place]) Inject 120 mcg into the skin every 28 (twenty-eight) days. Receives at Dr Antonieta Pert office    [provider]  lansoprazole (PREVACID) 30 MG capsule Take 1 capsule (30 mg total) by mouth daily at 12 noon.  11/19/16   [place], Gay Filler, MD  levothyroxine (SYNTHROID, LEVOTHROID) 50 MCG tablet Take 1 tablet (50 mcg total) by mouth daily. 03/01/17   [place], Gay Filler, MD  losartan (COZAAR) 100 MG tablet Take 1 tablet (100 mg total) by mouth at bedtime. 10/14/16   [place], Gay Filler, MD  metoprolol tartrate (LOPRESSOR) 25 MG tablet Take 1 tablet (25 mg total) by mouth 2 (two) times daily. 01/28/17   Volanda Napoleon, MD  nitroGLYCERIN (NITROSTAT) 0.4 MG SL tablet Place 1 tablet (0.4 mg total) under the tongue every 5 (five) minutes x 3 doses as needed for chest pain. 02/11/16   Adrian Prows, MD  Omega-3 Fatty Acids (FISH OIL) 1000 MG CAPS Take 1 capsule by mouth daily. Reported on 12/30/2015    [provider]  predniSONE (DELTASONE) 50 MG tablet Take 50mg  at 13 hours, 7 hours, and 1 hour before scan 02/18/17   [place], Holli Humbles, NP  sodium chloride (OCEAN) 0.65 % SOLN nasal spray Place 1 spray into both nostrils as needed for congestion.     [provider]  ticagrelor (BRILINTA) 90 MG TABS tablet Take 1 tablet (90 mg total) by mouth 2 (two) times daily. 02/11/16   Adrian Prows, MD  vitamin B-12 (CYANOCOBALAMIN) 1000 MCG tablet Take 1,000 mcg by mouth daily.    [provider]     Family History  Problem Relation Age of Onset  . Kidney disease Mother   . Hyperlipidemia Mother   . Hypertension Mother   . COPD Father   . Ulcerative colitis Daughter     Social History   Social History  . Marital status: Married    Spouse name: N/A  . Number of children: 1  . Years of education: N/A   Occupational History  . [place] History Main Topics  . Smoking status: Former Smoker    Packs/day: 1.50    Years: 35.00    Types: Cigarettes    Start date: 09/25/1959    Quit date: 12/21/1997  . Smokeless tobacco: Never Used  . Alcohol use 1.8 - 2.4 oz/week    3 - 4 Cans of beer per week     Comment: 4 beers weekly   . Drug use: No  . Sexual activity: Yes    Other Topics Concern  . Not on file   Social History Narrative   Regular Exercise -  NO    ECOG Status: 1 - Symptomatic but completely ambulatory  Review of Systems: A 12 point ROS discussed and pertinent positives are indicated in the HPI above.  All other systems are negative.  Review of Systems  Constitutional: Positive for fatigue.  HENT: Negative.   Eyes: Negative.   Respiratory: Negative.   Gastrointestinal:       Belching  Genitourinary: Negative.   Skin:       Flushing  Neurological: Positive for dizziness and  light-headedness.    Vital Signs: There were no vitals taken for this visit.  Physical Exam  Constitutional: He is oriented to person, place, and time. He appears well-developed and well-nourished.  HENT:  Head: Normocephalic and atraumatic.  Nose: Nose normal.  Mouth/Throat: Oropharynx is clear and moist.  Eyes: Pupils are equal, round, and reactive to light.  Cardiovascular: Normal rate, normal heart sounds and intact distal pulses.   Pulmonary/Chest: Effort normal and breath sounds normal.  Abdominal: Soft.  Musculoskeletal: Normal range of motion.  Neurological: He is alert and oriented to person, place, and time.  Skin: Skin is warm and dry. Capillary refill takes less than 2 seconds.  Psychiatric: He has a normal mood and affect. His behavior is normal. Judgment and thought content normal.  Vitals reviewed.    Imaging:   GA 84 PET DOTATATE = 03/17/17 -  positive for liver metastasis and central mesenteric mass.  Consistent with well differentiated neuorendocrine tumor.  Labs:  CBC:  Recent Labs  04/14/17 0739 05/26/17 1104 06/15/17 1621 06/24/17 1119  WBC 6.4 6.0 8.9 6.4  HGB 12.0* 11.9* 11.8* 11.1*  HCT 37.2* 36.6* 36.5* 34.4*  PLT 141* 168 160.0 212    COAGS: No results for input(s): INR, APTT in the last 8760 hours.  BMP:  Recent Labs  10/09/16 1427  02/09/17 1528  04/14/17 0739 05/26/17 1104 06/15/17 1621  06/24/17 1119  NA 139  < > 139  < > 140 143 138 141  K 3.6  < > 4.2  < > 3.8 4.5 4.6 3.8  CL 102  < > 101  < > 106 105 102 104  CO2 29  < > 27  < > 27 30 31 28   GLUCOSE 100*  < > 158*  < > 151* 102 118* 105  BUN 11  < > 12  < > 12 10 11 12   CALCIUM 8.9  < > 9.1  < > 9.1 9.3 9.1 8.9  CREATININE 1.08  < > 1.00  < > 0.7 1.0 0.94 1.1  GFRNONAA >60  --  76  --   --   --   --   --   GFRAA >60  --  88  --   --   --   --   --   < > = values in this interval not displayed.  LIVER FUNCTION TESTS:  Recent Labs  03/18/17 1133 04/14/17 0739 05/26/17 1104 06/24/17 1119  BILITOT 0.90 0.90 1.10 1.10  AST 29 27 35 31  ALT 22 22 14 15   ALKPHOS 67 61 57 71  PROT 7.4 6.9 7.7 8.1  ALBUMIN 3.2* 3.0* 3.4 3.2*   Bili = 1.1   TUMOR MARKERS: Chromogarnin A  - stable  06/24/2017   Laboratory value Exclusion criteria for Lu 177 peptide radiotherapy: Creatinine greater than 1.7 mg/dL Hemoglobin concentration < 8.0 g/dL White blood cell count less than 2000 per cubic milliliter Platelets less than 75,000 Total bilirubin greater than 3 mg/dl  Assessment and Plan:  Patient is a good candidate for peptide receptor radiotherapy.  Patient has well differentiated neuroendocrine tumor avid for the somatostatin receptor specific DOTATATE PET imaging agent with avid lesions in the liver and central mesenteric mass.  Patient is healthy with minimal neuroendocrine symptoms and reports only flushing and mild GI upset.  Patient on monthly Sandostatin therapy with last therapy 5 05/24/2017.    Patient's laboratory values meet minimal criteria with only mild anemia and low albumin  noted.  Normal renal function.  Patient will be scheduled for therapy in the first weeks of October . Please hold September  Sandostatin monthly dose.   Patient informed benefits and risks of Lutathera therapy. All questions were answered. Additional radiation safety education was provided.   Thank you for this interesting  consult.  I greatly enjoyed meeting Brian Mays and look forward to participating in their care.  A copy of this report was sent to the requesting provider on this date.  Electronically Signed: Halina Andreas, MD 06/29/2017, 12:18 PM     I spent a total of30 Minutes in face to face in clinical consultation, greater than 50% of which was counseling/coordinating care for PRRT for metastatic neuroendocrine tumor.

## 2017-07-08 ENCOUNTER — Other Ambulatory Visit (INDEPENDENT_AMBULATORY_CARE_PROVIDER_SITE_OTHER): Payer: Medicare HMO

## 2017-07-08 DIAGNOSIS — D649 Anemia, unspecified: Secondary | ICD-10-CM

## 2017-07-08 LAB — FECAL OCCULT BLOOD, IMMUNOCHEMICAL: Fecal Occult Bld: NEGATIVE

## 2017-07-18 ENCOUNTER — Telehealth: Payer: Self-pay | Admitting: *Deleted

## 2017-07-18 ENCOUNTER — Encounter: Payer: Self-pay | Admitting: Family

## 2017-07-18 NOTE — Telephone Encounter (Signed)
Received a call from [employer] in Nuclear Medicine at [facility] stating that patient is getting ready to receive Lutaphera therapy at [facility] on 08/10/17 in the am.  Patient needs to cancel 07/25/17 because long acting Sandostatin needs to be held prior to receiving [place] therapy.  Patient needs to receive Sandostatin injection within 24 hours after Lutaphera therapy to help with side effects.  Asked that we set up appt at [facility] at 3p on 08/10/17 to receive this injection.  Message sent to Adrian Saran to set this. Up.

## 2017-07-25 ENCOUNTER — Ambulatory Visit (HOSPITAL_BASED_OUTPATIENT_CLINIC_OR_DEPARTMENT_OTHER): Payer: Medicare HMO | Admitting: Hematology & Oncology

## 2017-07-25 ENCOUNTER — Ambulatory Visit: Payer: Medicare HMO

## 2017-07-25 ENCOUNTER — Other Ambulatory Visit (HOSPITAL_BASED_OUTPATIENT_CLINIC_OR_DEPARTMENT_OTHER): Payer: Medicare HMO

## 2017-07-25 VITALS — BP 153/71 | HR 59 | Temp 98.5°F | Resp 18 | Wt 205.0 lb

## 2017-07-25 DIAGNOSIS — C7A8 Other malignant neuroendocrine tumors: Secondary | ICD-10-CM

## 2017-07-25 DIAGNOSIS — C7B8 Other secondary neuroendocrine tumors: Secondary | ICD-10-CM | POA: Diagnosis not present

## 2017-07-25 LAB — CBC WITH DIFFERENTIAL (CANCER CENTER ONLY)
BASO#: 0 10*3/uL (ref 0.0–0.2)
BASO%: 0.6 % (ref 0.0–2.0)
EOS%: 4.3 % (ref 0.0–7.0)
Eosinophils Absolute: 0.3 10*3/uL (ref 0.0–0.5)
HEMATOCRIT: 37.2 % — AB (ref 38.7–49.9)
HEMOGLOBIN: 11.8 g/dL — AB (ref 13.0–17.1)
LYMPH#: 1.4 10*3/uL (ref 0.9–3.3)
LYMPH%: 21.2 % (ref 14.0–48.0)
MCH: 27 pg — ABNORMAL LOW (ref 28.0–33.4)
MCHC: 31.7 g/dL — AB (ref 32.0–35.9)
MCV: 85 fL (ref 82–98)
MONO#: 0.5 10*3/uL (ref 0.1–0.9)
MONO%: 7.4 % (ref 0.0–13.0)
NEUT%: 66.5 % (ref 40.0–80.0)
NEUTROS ABS: 4.5 10*3/uL (ref 1.5–6.5)
Platelets: 179 10*3/uL (ref 145–400)
RBC: 4.37 10*6/uL (ref 4.20–5.70)
RDW: 15.7 % (ref 11.1–15.7)
WBC: 6.8 10*3/uL (ref 4.0–10.0)

## 2017-07-25 LAB — CMP (CANCER CENTER ONLY)
ALBUMIN: 3.2 g/dL — AB (ref 3.3–5.5)
ALK PHOS: 60 U/L (ref 26–84)
ALT: 21 U/L (ref 10–47)
AST: 28 U/L (ref 11–38)
BILIRUBIN TOTAL: 0.9 mg/dL (ref 0.20–1.60)
BUN, Bld: 11 mg/dL (ref 7–22)
CALCIUM: 9 mg/dL (ref 8.0–10.3)
CO2: 29 mEq/L (ref 18–33)
CREATININE: 0.9 mg/dL (ref 0.6–1.2)
Chloride: 104 mEq/L (ref 98–108)
Glucose, Bld: 100 mg/dL (ref 73–118)
Potassium: 4.3 mEq/L (ref 3.3–4.7)
SODIUM: 143 meq/L (ref 128–145)
TOTAL PROTEIN: 7.7 g/dL (ref 6.4–8.1)

## 2017-07-25 LAB — LACTATE DEHYDROGENASE: LDH: 208 U/L (ref 125–245)

## 2017-07-25 NOTE — Progress Notes (Signed)
Hematology and Oncology Follow Up Visit  Brian Mays [id number] [birth year] 71 y.o. 07/25/2017   Principle Diagnosis:  Metastatic low grade neuroendocrine tumor-hepatic metastases  Current Therapy:   S/p third yttrium-90 intrahepatic therapy - November 2016 Somatuline 120 mg monthly Lutathera (Lu 177) injection on 08/10/2017    Interim History: Brian Mays is back for follow-up. He is doing quite well. He had a good weekend.  He will have his Lutathera treatment on October 10. This was all approved.  I think he will do very well with Lutathera.  His last chromogranin A level was 21.  He's had no problems with diarrhea. He's had no nausea or vomiting. He's had no abdominal pain. He's had no cough or shortness of breath. He's had no rashes. There's been no leg swelling.  Overall, his performance status is ECOG 0.   Medications:  Allergies as of 07/25/2017      Reactions   Iohexol Hives    Code: HIVES, Desc: PER [name] @ [facility]IMARY [facility], PT IS ALLERGIC TO CONTRAST DYE 10/02/08/RM  05/01/10...needs full premeds per our protocol w/ [facility]., Onset Date: [id number]      Medication List       Accurate as of 07/25/17 12:07 PM. Always use your most recent med list.          amLODipine 5 MG tablet Commonly known as:  NORVASC Take 5 mg by mouth daily.   aspirin EC 81 MG tablet Take 81 mg by mouth every morning.   atorvastatin 80 MG tablet Commonly known as:  LIPITOR Take 1 tablet (80 mg total) by mouth daily at 6 PM.   Cholecalciferol 1000 units tablet Take 1,000 Units by mouth daily.   diphenhydrAMINE 50 MG tablet Commonly known as:  BENADRYL Take 1 tablet (50 mg total) by mouth once. Take 1 hour prior to scan.   Fish Oil 1000 MG Caps Take 1 capsule by mouth daily. Reported on 12/30/2015   fluticasone 50 MCG/ACT nasal spray Commonly known as:  FLONASE Place 2 sprays into both nostrils daily as needed (sinuses).   hydrocortisone 25 MG suppository Commonly known as:   ANUSOL-HC INSERT 1 SUPPOSITORY RECTALLY TWICE A DAY AS NEEDED FOR HEMORRHOIDS   lansoprazole 30 MG capsule Commonly known as:  PREVACID Take 1 capsule (30 mg total) by mouth daily at 12 noon.   levothyroxine 50 MCG tablet Commonly known as:  SYNTHROID, LEVOTHROID Take 1 tablet (50 mcg total) by mouth daily.   losartan 100 MG tablet Commonly known as:  COZAAR Take 1 tablet (100 mg total) by mouth at bedtime.   metoprolol tartrate 25 MG tablet Commonly known as:  LOPRESSOR Take 1 tablet (25 mg total) by mouth 2 (two) times daily.   nitroGLYCERIN 0.4 MG SL tablet Commonly known as:  NITROSTAT Place 1 tablet (0.4 mg total) under the tongue every 5 (five) minutes x 3 doses as needed for chest pain.   predniSONE 50 MG tablet Commonly known as:  DELTASONE Take 50mg  at 13 hours, 7 hours, and 1 hour before scan   sodium chloride 0.65 % Soln nasal spray Commonly known as:  OCEAN Place 1 spray into both nostrils as needed for congestion.   SOMATULINE DEPOT [place] Inject 120 mcg into the skin every 28 (twenty-eight) days. Receives at Dr Antonieta Pert office   ticagrelor 90 MG Tabs tablet Commonly known as:  BRILINTA Take 1 tablet (90 mg total) by mouth 2 (two) times daily.   vitamin B-12 1000 MCG tablet  Commonly known as:  CYANOCOBALAMIN Take 1,000 mcg by mouth daily.            Discharge Care Instructions        Start     Ordered   07/25/17 0000  CBC with Differential [facility])     07/25/17 1204   07/25/17 0000  Chromogranin A     07/25/17 1204   07/25/17 0000  CMP STAT ([place] only)     07/25/17 1204      Allergies:  Allergies  Allergen Reactions  . Iohexol Hives     Code: HIVES, Desc: PER [name] @ [facility], PT IS ALLERGIC TO CONTRAST DYE 10/02/08/RM  05/01/10...needs full premeds per our protocol w/ [facility]., Onset Date: [id number]     Past Medical History, Surgical history, Social history, and Family History were reviewed and  updated.  Review of Systems: Review of Systems  Constitutional: Negative for appetite change, fatigue, fever and unexpected weight change.  HENT:   Negative for lump/mass, mouth sores, sore throat and trouble swallowing.   Respiratory: Negative for cough, hemoptysis and shortness of breath.   Cardiovascular: Negative for leg swelling and palpitations.  Gastrointestinal: Negative for abdominal distention, abdominal pain, blood in stool, constipation, diarrhea, nausea and vomiting.  Genitourinary: Negative for bladder incontinence, dysuria, frequency and hematuria.   Musculoskeletal: Negative for arthralgias, back pain, gait problem and myalgias.  Skin: Negative for itching and rash.  Neurological: Negative for dizziness, extremity weakness, gait problem, headaches, numbness, seizures and speech difficulty.  Hematological: Does not bruise/bleed easily.  Psychiatric/Behavioral: Negative for depression and sleep disturbance. The patient is not nervous/anxious.      Physical Exam:  weight is 205 lb (93 kg). His oral temperature is 98.5 F (36.9 C). His blood pressure is 153/71 (abnormal) and his pulse is 59 (abnormal). His respiration is 18 and oxygen saturation is 100%.   Wt Readings from Last 3 Encounters:  07/25/17 205 lb (93 kg)  06/24/17 203 lb (92.1 kg)  06/23/17 207 lb (93.9 kg)    Physical Exam  Constitutional: He is oriented to person, place, and time.  HENT:  Head: Normocephalic and atraumatic.  Mouth/Throat: Oropharynx is clear and moist.  Eyes: Pupils are equal, round, and reactive to light. EOM are normal.  Neck: Normal range of motion.  Cardiovascular: Normal rate, regular rhythm and normal heart sounds.   Pulmonary/Chest: Effort normal and breath sounds normal.  Abdominal: Soft. Bowel sounds are normal.  Musculoskeletal: Normal range of motion. He exhibits no edema, tenderness or deformity.  Lymphadenopathy:    He has no cervical adenopathy.  Neurological: He is  alert and oriented to person, place, and time.  Skin: Skin is warm and dry. No rash noted. No erythema.  Psychiatric: He has a normal mood and affect. His behavior is normal. Judgment and thought content normal.  Vitals reviewed. .   Lab Results  Component Value Date   WBC 6.8 07/25/2017   HGB 11.8 (L) 07/25/2017   HCT 37.2 (L) 07/25/2017   MCV 85 07/25/2017   PLT 179 07/25/2017   No results found for: FERRITIN, IRON, TIBC, UIBC, IRONPCTSAT Lab Results  Component Value Date   RBC 4.37 07/25/2017   No results found for: KPAFRELGTCHN, LAMBDASER, KAPLAMBRATIO No results found for: IGGSERUM, IGA, IGMSERUM No results found for: Ronnald Ramp, A1GS, A2GS, Violet Baldy, MSPIKE, SPEI   Chemistry      Component Value Date/Time   NA 143 07/25/2017 1109  NA 141 07/23/2016 1255   K 4.3 07/25/2017 1109   K 4.1 07/23/2016 1255   CL 104 07/25/2017 1109   CO2 29 07/25/2017 1109   CO2 27 07/23/2016 1255   BUN 11 07/25/2017 1109   BUN 13.8 07/23/2016 1255   CREATININE 0.9 07/25/2017 1109   CREATININE 1.0 07/23/2016 1255      Component Value Date/Time   CALCIUM 9.0 07/25/2017 1109   CALCIUM 9.2 07/23/2016 1255   ALKPHOS 60 07/25/2017 1109   ALKPHOS 77 07/23/2016 1255   AST 28 07/25/2017 1109   AST 23 07/23/2016 1255   ALT 21 07/25/2017 1109   ALT 13 07/23/2016 1255   BILITOT 0.90 07/25/2017 1109   BILITOT 1.13 07/23/2016 1255     Impression and Plan: Mr. Brill is A 71 year old white male. He has metastatic low-grade neuroendocrine carcinoma. He has hepatic metastases.  I know that the Lutathera will be a great treatment option for him. I feel very confident that this will help.  I will see him back in 4 weeks after he has a Lutathera. He'll continue his Somatuline injections.  He comes in with his daughter. I'm not seen her for about 6 years. She now has a six-year-old daughter herself.  Burney Gauze, MD  9/24/201812:07 PM

## 2017-07-27 LAB — CHROMOGRANIN A: Chromogranin A: 10 nmol/L — ABNORMAL HIGH (ref 0–5)

## 2017-08-03 ENCOUNTER — Other Ambulatory Visit: Payer: Self-pay | Admitting: Hematology & Oncology

## 2017-08-03 DIAGNOSIS — C7A8 Other malignant neuroendocrine tumors: Secondary | ICD-10-CM

## 2017-08-06 ENCOUNTER — Other Ambulatory Visit: Payer: Self-pay | Admitting: Nurse Practitioner

## 2017-08-08 ENCOUNTER — Other Ambulatory Visit: Payer: Self-pay | Admitting: Family Medicine

## 2017-08-08 ENCOUNTER — Other Ambulatory Visit: Payer: Self-pay | Admitting: Hematology & Oncology

## 2017-08-08 DIAGNOSIS — C7A8 Other malignant neuroendocrine tumors: Secondary | ICD-10-CM

## 2017-08-09 ENCOUNTER — Other Ambulatory Visit: Payer: Self-pay | Admitting: Emergency Medicine

## 2017-08-09 MED ORDER — LEVOTHYROXINE SODIUM 50 MCG PO TABS: 50.0000 ug | ORAL_TABLET | Freq: Every day | ORAL | 1 refills | Status: DC

## 2017-08-10 ENCOUNTER — Ambulatory Visit (HOSPITAL_BASED_OUTPATIENT_CLINIC_OR_DEPARTMENT_OTHER): Payer: Medicare HMO

## 2017-08-10 ENCOUNTER — Ambulatory Visit (HOSPITAL_COMMUNITY)
Admission: RE | Admit: 2017-08-10 | Discharge: 2017-08-10 | Disposition: A | Discharge status: Home / Self Care | Payer: Medicare HMO | Source: Ambulatory Visit | Attending: Hematology & Oncology | Admitting: Hematology & Oncology

## 2017-08-10 ENCOUNTER — Other Ambulatory Visit: Payer: Self-pay | Admitting: Hematology & Oncology

## 2017-08-10 DIAGNOSIS — E340 Carcinoid syndrome: Secondary | ICD-10-CM

## 2017-08-10 DIAGNOSIS — C7A8 Other malignant neuroendocrine tumors: Secondary | ICD-10-CM | POA: Diagnosis not present

## 2017-08-10 DIAGNOSIS — C7B8 Other secondary neuroendocrine tumors: Secondary | ICD-10-CM

## 2017-08-10 DIAGNOSIS — C787 Secondary malignant neoplasm of liver and intrahepatic bile duct: Secondary | ICD-10-CM | POA: Diagnosis not present

## 2017-08-10 IMAGING — NM NM [PERSON_NAME] ADMINISTRATION
1 series · 1 of 1 positions shown · non-contrast
Comparison: none

CLINICAL DATA: 71-year-old year-old male with metastatic
neuroendocrine tumor. Well differentiated tumor with somatostatin
receptor is identified within the liver and bowel mesenteries by
DOTATATE PET CT scan.

EXAM:
NUCLEAR MEDICINE LUTATHERA ADMINISTRATION
TECHNIQUE: Infusion: The nuclear medicine technologist and I personally
verified the dose activity (205 mCi) to be delivered as specified in
the written directive (200 mCi), and verified the patient
identification via 2 separate methods. 20 gauge IV were started in
the antecubital veins. Anti-emetics were administered by nursing
staff. REHM acid renal protection was initiated 30 minutes prior to
Lu 177 DOTATATE (Lutathera) infusion and continued continuously for
4 hours. Lutathera infusion was administered over 30 minutes.

[Series 1: static · 2.07mm/px · 1 of 1 slices shown]
[im 1/1]
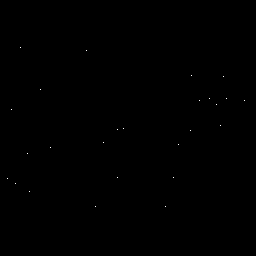

[1 of 1 positions shown; findings below may reference images not displayed]

The total administered dose was 203.4  mCi Lu 177 DOTATATE.

The entire IV tubing, venocatheter, stopcock and syringes was
removed in total, placed in a disposal bag and sent for assay of the
residual activity, which will be reported at a later time in our EMR
by the physics staff. Pressure was applied to the venipuncture site,
and a compression bandage placed. Radiation Safety personnel were
present to perform the discharge survey, as detailed on their
documentation.

RADIOPHARMACEUTICALS:  203.4 mCi Lu 177 DOTATATE
FINDINGS: Diagnosis: Metastatic neuroendocrine tumor.

Current Infusion: 1

Planned Infusions: 4

Patient presented to nuclear medicine for treatment. The patient's
most recent blood counts and liver function were reviewed and
remains a good candidate to proceed with Lutathera. The patient was
situated in an infusion suite and administered Lutathera as above.
Patient will follow-up with referring oncologist for interval serum
laboratories.

Micro-dosimetry: The prescribed radiation activity was assayed and
confirmed to be within specified tolerance.

Patient space moderate flushing during procedure. The degree of
flushing is typical per patient report. Patient received Sandostatin
injection after therapy which showed provide relief.

Patient will report to the [HOSPITAL] to have a Sandostatin IM
injection between 4 and 24 hours post Lutathera administration.
IMPRESSION: First Lu 177 DOTATATE treatment for metastatic neuroendocrine tumor.
The patient tolerated the infusion well . Patient had experience
moderate flushing during infusion which is typical for patient. The
patient will return in 8 to 10 weeks for ongoing care.

## 2017-08-10 MED ORDER — OCTREOTIDE ACETATE 30 MG IM KIT
30.0000 mg | PACK | Freq: Once | INTRAMUSCULAR | Status: AC
  Administered 2017-08-10: 30 mg via INTRAMUSCULAR
  Filled 2017-08-10: qty 1

## 2017-08-10 MED ORDER — LUTETIUM LU 177 DOTATATE 370 MBQ/ML IV SOLN
200.0000 | Freq: Once | INTRAVENOUS | Status: AC
  Administered 2017-08-10: 203.4 via INTRAVENOUS

## 2017-08-10 MED ORDER — SODIUM CHLORIDE 0.9 % IV SOLN
500.0000 mL | Freq: Once | INTRAVENOUS | Status: DC
Start: 2017-08-10 — End: 2017-08-16

## 2017-08-10 MED ORDER — SODIUM CHLORIDE 0.9 % IV SOLN
8.0000 mg | Freq: Once | INTRAVENOUS | Status: AC
  Administered 2017-08-10: 8 mg via INTRAVENOUS
  Filled 2017-08-10: qty 4

## 2017-08-10 MED ORDER — ONDANSETRON HCL 8 MG PO TABS
8.0000 mg | ORAL_TABLET | Freq: Two times a day (BID) | ORAL | 0 refills | Status: DC | PRN
Start: 2017-08-10 — End: 2017-09-05

## 2017-08-10 MED ORDER — AMINO ACID RADIOPROTECTANT - L-LYSINE 2.5%/L-ARGININE 2.5% IN NS
250.0000 mL/h | INTRAVENOUS | Status: AC
  Administered 2017-08-10: 250 mL/h via INTRAVENOUS
  Filled 2017-08-10: qty 1000

## 2017-08-10 NOTE — Consult Note (Signed)
Patient received first of four Lutathera Peptide receptor radiotherapy - PRRT. Patient tolerated the procedure well with no nausea or vomiting related to the amino acid infusion. Patient received post therapy Sandostatin (30 mg) IM injection same day at the outpatient injection center. Patient will return for follow-up  and laboratory assessment (CBC and CMP) with Dr. Marin Olp in 2-4 weeks. Patient will return in the 8 to 10 weeks to [place]. for next PRRT. Patient may receive an interim Sandostatin injection (4 weeks from now) during the therapy period as he has mild carcinoid symptoms.

## 2017-08-10 NOTE — Progress Notes (Signed)
Patient ID: Brian Mays, male   DOB: [birth year], 71 y.o.   MRN: [id number] Patient did experience mild to moderate flushing during Lutathera infusion.  Flushing typical in severity for this patient.  He will receive Sandostatin LAR after infusion which should provide some relief.  Pt instructed to contact MD if carcinoid symptoms escalate.

## 2017-08-15 ENCOUNTER — Other Ambulatory Visit: Payer: Self-pay | Admitting: Family Medicine

## 2017-08-16 ENCOUNTER — Ambulatory Visit (INDEPENDENT_AMBULATORY_CARE_PROVIDER_SITE_OTHER): Payer: Medicare HMO | Admitting: Orthopaedic Surgery

## 2017-08-16 DIAGNOSIS — M5416 Radiculopathy, lumbar region: Secondary | ICD-10-CM | POA: Diagnosis not present

## 2017-08-16 MED ORDER — PREDNISONE 10 MG (21) PO TBPK: ORAL_TABLET | ORAL | 0 refills | Status: DC

## 2017-08-16 MED ORDER — TIZANIDINE HCL 4 MG PO TABS: 4.0000 mg | ORAL_TABLET | Freq: Four times a day (QID) | ORAL | 2 refills | Status: DC | PRN

## 2017-08-16 MED ORDER — NAPROXEN 500 MG PO TABS: 500.0000 mg | ORAL_TABLET | Freq: Two times a day (BID) | ORAL | 3 refills | Status: DC

## 2017-08-16 NOTE — Progress Notes (Signed)
Office Visit Note   Patient: Brian Mays           Date of Birth: 27-Aug-1946           MRN: [id number] Visit Date: 08/16/2017              Requested by: Darreld Mclean, MD [address] [place], [place] [address] PCP: Darreld Mclean, MD   Assessment & Plan: Visit Diagnoses:  1. Lumbar radiculopathy     Plan: Patient has acute left leg sciatic flareup. Prescription for prednisone, Zanaflex, naproxen. Resume home exercises. Questions encouraged and answered. Follow-up as needed.  Follow-Up Instructions: Return if symptoms worsen or fail to improve.   Orders:  No orders of the defined types were placed in this encounter.  Meds ordered this encounter  Medications  . predniSONE (STERAPRED UNI-PAK 21 TAB) 10 MG (21) TBPK tablet    Sig: Take as directed    Dispense:  21 tablet    Refill:  0  . tiZANidine (ZANAFLEX) 4 MG tablet    Sig: Take 1 tablet (4 mg total) by mouth every 6 (six) hours as needed for muscle spasms.    Dispense:  30 tablet    Refill:  2  . naproxen (NAPROSYN) 500 MG tablet    Sig: Take 1 tablet (500 mg total) by mouth 2 (two) times daily with a meal.    Dispense:  30 tablet    Refill:  3      Procedures: No procedures performed   Clinical Data: No additional findings.   Subjective: Chief Complaint  Patient presents with  . Lower Back - Pain    Patient is a 71 year old gentleman comes in with acute left-sided sciatic flareup. He this in the past and he states that this feels exactly like previous episodes. Denies any bowel dysfunction. Denies any numbness and tingling. The pain travels from his buttock down his leg.    Review of Systems  Constitutional: Negative.   All other systems reviewed and are negative.    Objective: Vital Signs: There were no vitals taken for this visit.  Physical Exam  Constitutional: He is oriented to person, place, and time. He appears well-developed and well-nourished.  HENT:  Head:  Normocephalic and atraumatic.  Eyes: Pupils are equal, round, and reactive to light.  Neck: Neck supple.  Pulmonary/Chest: Effort normal.  Abdominal: Soft.  Musculoskeletal: Normal range of motion.  Neurological: He is alert and oriented to person, place, and time.  Skin: Skin is warm.  Psychiatric: He has a normal mood and affect. His behavior is normal. Judgment and thought content normal.  Nursing note and vitals reviewed.   Ortho Exam Left lower extremity exam is nonfocal. Specialty Comments:  No specialty comments available.  Imaging: No results found.   PMFS History: Patient Active Problem List   Diagnosis Date Noted  . Lumbar radiculopathy 08/16/2017  . CAD (coronary artery disease) 09/21/2016  . Unstable angina ([place]) 02/09/2016  . Vitamin D deficiency 03/11/2015  . Metastatic malignant neuroendocrine tumor to liver ([place]) 02/14/2015  . Hyperglycemia 04/03/2012  . Diarrhea 03/29/2012  . B12 deficiency 04/19/2011  . TOBACCO USE, QUIT 10/13/2009  . Unspecified vitamin D deficiency 06/19/2009  . Neoplasm by body site 12/23/2008  . DIVERTICULOSIS, COLON 11/08/2008  . ABDOMINAL PAIN 10/02/2008  . Hypothyroidism 12/13/2007  . HEMORRHOIDS, NOS 12/13/2007  . COLONIC POLYPS, HX OF 12/13/2007  . Essential hypertension 09/15/2007  . GERD 09/15/2007  .  PANCREATITIS, CHRONIC 09/15/2007  . CARDIAC MURMUR 09/15/2007  . SNORING 09/15/2007   Past Medical History:  Diagnosis Date  . Anemia    in past  . Colon polyps   . Diverticulosis   . Gallstones   . GERD (gastroesophageal reflux disease)   . Heart attack ([place])    mild, Spring 2017  . Heart murmur   . Hemorrhoid   . Hiatal hernia   . HTN (hypertension)   . Hypothyroidism   . Metastatic carcinoma ([place]) 2010   Dr Jonette Eva  . Pancreatitis 1998   chronic    Family History  Problem Relation Age of Onset  . Kidney disease Mother   . Hyperlipidemia Mother   . Hypertension Mother   . COPD Father   . Ulcerative  colitis Daughter     Past Surgical History:  Procedure Laterality Date  . APPENDECTOMY  1962  . CARDIAC CATHETERIZATION N/A 02/10/2016   Procedure: Left Heart Cath and Coronary Angiography;  Surgeon: Adrian Prows, MD;  Location: [facility];  Service: Cardiovascular;  Laterality: N/A;  . CARDIAC CATHETERIZATION  02/10/2016   Procedure: Coronary/Graft Atherectomy;  Surgeon: Adrian Prows, MD;  Location: [facility];  Service: Cardiovascular;;  . CARDIAC CATHETERIZATION  02/10/2016   Procedure: Coronary Stent Intervention;  Surgeon: Adrian Prows, MD;  Location: [facility];  Service: Cardiovascular;;  . CATARACT EXTRACTION W/ INTRAOCULAR LENS  IMPLANT, BILATERAL Bilateral   . IR GENERIC HISTORICAL  12/30/2015   IR RADIOLOGIST EVAL & MGMT 12/30/2015 Aletta Edouard, MD [facility] INTER[facility] RAD  . IR GENERIC HISTORICAL  11/09/2016   IR RADIOLOGIST EVAL & MGMT 11/09/2016 Aletta Edouard, MD [facility] INTER[facility]  . IR RADIOLOGIST EVAL & MGMT  03/01/2017  . LAPAROSCOPIC CHOLECYSTECTOMY  1999  . LIVER BIOPSY  2010  . RADIOACTIVE SEED IMPLANT  X 3   "to my liver"  . TUMOR EXCISION  01/2009   Carcinoil Resection   . TUMOR REMOVAL     from small intestine   Social History   Occupational History  . [employer] History Main Topics  . Smoking status: Former Smoker    Packs/day: 1.50    Years: 35.00    Types: Cigarettes    Start date: 09/25/1959    Quit date: 12/21/1997  . Smokeless tobacco: Never Used  . Alcohol use 1.8 - 2.4 oz/week    3 - 4 Cans of beer per week     Comment: 4 beers weekly   . Drug use: No  . Sexual activity: Yes

## 2017-08-25 ENCOUNTER — Encounter (HOSPITAL_COMMUNITY): Payer: Self-pay | Admitting: *Deleted

## 2017-08-25 ENCOUNTER — Emergency Department (HOSPITAL_COMMUNITY)
Admission: EM | Admit: 2017-08-25 | Discharge: 2017-08-25 | Disposition: A | Discharge status: Home / Self Care | Payer: Medicare HMO | Attending: Emergency Medicine | Admitting: Emergency Medicine

## 2017-08-25 DIAGNOSIS — E039 Hypothyroidism, unspecified: Secondary | ICD-10-CM | POA: Diagnosis not present

## 2017-08-25 DIAGNOSIS — I2510 Atherosclerotic heart disease of native coronary artery without angina pectoris: Secondary | ICD-10-CM | POA: Insufficient documentation

## 2017-08-25 DIAGNOSIS — I959 Hypotension, unspecified: Secondary | ICD-10-CM | POA: Diagnosis not present

## 2017-08-25 DIAGNOSIS — I252 Old myocardial infarction: Secondary | ICD-10-CM | POA: Diagnosis not present

## 2017-08-25 DIAGNOSIS — Z87891 Personal history of nicotine dependence: Secondary | ICD-10-CM | POA: Diagnosis not present

## 2017-08-25 DIAGNOSIS — Z7982 Long term (current) use of aspirin: Secondary | ICD-10-CM | POA: Insufficient documentation

## 2017-08-25 DIAGNOSIS — E860 Dehydration: Secondary | ICD-10-CM | POA: Insufficient documentation

## 2017-08-25 DIAGNOSIS — Z79899 Other long term (current) drug therapy: Secondary | ICD-10-CM | POA: Diagnosis not present

## 2017-08-25 DIAGNOSIS — R001 Bradycardia, unspecified: Secondary | ICD-10-CM | POA: Diagnosis not present

## 2017-08-25 DIAGNOSIS — R42 Dizziness and giddiness: Secondary | ICD-10-CM | POA: Diagnosis present

## 2017-08-25 LAB — CBC
HCT: 37.5 % — ABNORMAL LOW (ref 39.0–52.0)
Hemoglobin: 11.8 g/dL — ABNORMAL LOW (ref 13.0–17.0)
MCH: 26.5 pg (ref 26.0–34.0)
MCHC: 31.5 g/dL (ref 30.0–36.0)
MCV: 84.3 fL (ref 78.0–100.0)
PLATELETS: 126 10*3/uL — AB (ref 150–400)
RBC: 4.45 MIL/uL (ref 4.22–5.81)
RDW: 16.3 % — ABNORMAL HIGH (ref 11.5–15.5)
WBC: 6.6 10*3/uL (ref 4.0–10.5)

## 2017-08-25 LAB — BASIC METABOLIC PANEL
Anion gap: 3 — ABNORMAL LOW (ref 5–15)
BUN: 15 mg/dL (ref 6–20)
CALCIUM: 8.6 mg/dL — AB (ref 8.9–10.3)
CHLORIDE: 104 mmol/L (ref 101–111)
CO2: 30 mmol/L (ref 22–32)
CREATININE: 1.09 mg/dL (ref 0.61–1.24)
GFR calc non Af Amer: >60 mL/min (ref >60)
Glucose, Bld: 75 mg/dL (ref 65–99)
Potassium: 4.5 mmol/L (ref 3.5–5.1)
SODIUM: 137 mmol/L (ref 135–145)

## 2017-08-25 LAB — T4, FREE: Free T4: 1.15 ng/dL — ABNORMAL HIGH (ref 0.61–1.12)

## 2017-08-25 LAB — URINALYSIS, ROUTINE W REFLEX MICROSCOPIC
Bilirubin Urine: NEGATIVE
Glucose, UA: NEGATIVE mg/dL
Hgb urine dipstick: NEGATIVE
KETONES UR: NEGATIVE mg/dL
LEUKOCYTES UA: NEGATIVE
NITRITE: NEGATIVE
Protein, ur: NEGATIVE mg/dL
Specific Gravity, Urine: 1.012 (ref 1.005–1.030)
pH: 7 (ref 5.0–8.0)

## 2017-08-25 LAB — TSH: TSH: 4.629 u[IU]/mL — ABNORMAL HIGH (ref 0.350–4.500)

## 2017-08-25 MED ORDER — SODIUM CHLORIDE 0.9 % IV BOLUS (SEPSIS)
1000.0000 mL | Freq: Once | INTRAVENOUS | Status: AC
  Administered 2017-08-25: 1000 mL via INTRAVENOUS

## 2017-08-25 NOTE — Discharge Instructions (Signed)
Call your cardiologist, Dr. Einar Gip today to help with adjustments in your blood pressure medicines.  Unless further instructed by him, do not take your amlodipine, metoprolol, or losartan as these can all lower your blood pressure.  If you feel recurrent symptoms, return to the ER immediately for reevaluation.  Otherwise drink plenty of fluids and rest.  Follow-up with your primary care doctor or cardiologist tomorrow or Monday.

## 2017-08-25 NOTE — ED Triage Notes (Signed)
To ED for eval of weakness this am. Pt states he had ' a lot' of diarrhea yesterday. Work this am and had a period of dizziness - resolved. Had wife take bp at home and noted 64/38. BP noted low in triage as well. No cp. No sob. No diarrhea today. Recently had radiation injection on Monday.

## 2017-08-25 NOTE — ED Provider Notes (Signed)
[facility] EMERGENCY DEPARTMENT Provider Note   CSN: [id number] Arrival date & time: 08/25/17  9833     History   Chief Complaint Chief Complaint  Patient presents with  . Hypotension  . Nausea  . Weakness    HPI Brian Mays is a 71 y.o. male.  HPI  71 year old male with a history of an MI with a stent placed in 2017 as well as hypertension and lumbar radiculopathy presents with weakness and near syncope.  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  One time he ate a couple bites of candy and felt better.  Yesterday he had to stop working in the yard much earlier than typical because he was feeling lightheaded and exhausted.  He had about 4 or 5 loose watery stools yesterday but no further diarrhea.  He did feel nauseated at one point yesterday but that has resolved.  However this morning he has been lightheaded, mostly when standing.  Wife checked his blood pressure and it was 64/38.  He already took his morning dose of blood pressure medicine today.  Besides feeling generally weak he denies any other acute complaints.  He denies fevers, headache, blurry vision, focal weakness/numbness, chest pain, shortness of breath, abdominal pain.  He had an exacerbation of his lumbar radiculopathy and was put on prednisone, muscle relaxer, and an anti-inflammatory last week.  He has been feeling like he is tired more and sleeping more. No recent adjustments in other meds. Currently in the ED room he is feeling better while sitting, denies current lightheadedness. BP 103/69.  Past Medical History:  Diagnosis Date  . Anemia    in past  . Colon polyps   . Diverticulosis   . Gallstones   . GERD (gastroesophageal reflux disease)   . Heart attack ([place])    mild, Spring 2017  . Heart murmur   . Hemorrhoid   . Hiatal hernia   . HTN (hypertension)   . Hypothyroidism   . Metastatic carcinoma ([place]) 2010   Dr Jonette Eva  .  Pancreatitis 1998   chronic    Patient Active Problem List   Diagnosis Date Noted  . Lumbar radiculopathy 08/16/2017  . CAD (coronary artery disease) 09/21/2016  . Unstable angina ([place]) 02/09/2016  . Vitamin D deficiency 03/11/2015  . Metastatic malignant neuroendocrine tumor to liver ([place]) 02/14/2015  . Hyperglycemia 04/03/2012  . Diarrhea 03/29/2012  . B12 deficiency 04/19/2011  . TOBACCO USE, QUIT 10/13/2009  . Unspecified vitamin D deficiency 06/19/2009  . Neoplasm by body site 12/23/2008  . DIVERTICULOSIS, COLON 11/08/2008  . ABDOMINAL PAIN 10/02/2008  . Hypothyroidism 12/13/2007  . HEMORRHOIDS, NOS 12/13/2007  . COLONIC POLYPS, HX OF 12/13/2007  . Essential hypertension 09/15/2007  . GERD 09/15/2007  . PANCREATITIS, CHRONIC 09/15/2007  . CARDIAC MURMUR 09/15/2007  . SNORING 09/15/2007    Past Surgical History:  Procedure Laterality Date  . APPENDECTOMY  1962  . CARDIAC CATHETERIZATION N/A 02/10/2016   Procedure: Left Heart Cath and Coronary Angiography;  Surgeon: Adrian Prows, MD;  Location: [facility];  Service: Cardiovascular;  Laterality: N/A;  . CARDIAC CATHETERIZATION  02/10/2016   Procedure: Coronary/Graft Atherectomy;  Surgeon: Adrian Prows, MD;  Location: [facility];  Service: Cardiovascular;;  . CARDIAC CATHETERIZATION  02/10/2016   Procedure: Coronary Stent Intervention;  Surgeon: Adrian Prows, MD;  Location: [facility];  Service: Cardiovascular;;  . CATARACT EXTRACTION W/ INTRAOCULAR  LENS  IMPLANT, BILATERAL Bilateral   . IR GENERIC HISTORICAL  12/30/2015   IR RADIOLOGIST EVAL & MGMT 12/30/2015 Aletta Edouard, MD [facility] INTERV RAD  . IR GENERIC HISTORICAL  11/09/2016   IR RADIOLOGIST EVAL & MGMT 11/09/2016 Aletta Edouard, MD [facility] INTERV RAD  . IR RADIOLOGIST EVAL & MGMT  03/01/2017  . LAPAROSCOPIC CHOLECYSTECTOMY  1999  . LIVER BIOPSY  2010  . RADIOACTIVE SEED IMPLANT  X 3   "to my liver"  . TUMOR EXCISION  01/2009   Carcinoil Resection   .  TUMOR REMOVAL     from small intestine       Home Medications    Prior to Admission medications   Medication Sig Start Date End Date Taking? Authorizing Provider  amLODipine (NORVASC) 5 MG tablet Take 5 mg by mouth daily. 03/16/17  Yes [provider]  aspirin EC 81 MG tablet Take 81 mg by mouth every morning.   Yes [provider]  atorvastatin (LIPITOR) 80 MG tablet Take 1 tablet (80 mg total) by mouth daily at 6 PM. Patient taking differently: Take 20 mg by mouth every evening.  02/11/16  Yes Adrian Prows, MD  Cholecalciferol 1000 UNITS tablet Take 1,000 Units by mouth daily.     Yes [provider]  fluticasone (FLONASE) 50 MCG/ACT nasal spray Place 2 sprays into both nostrils daily as needed (sinuses).  01/06/15  Yes [provider]  Lanreotide Acetate (SOMATULINE DEPOT [place]) Inject 120 mcg into the skin every 28 (twenty-eight) days. Receives at Dr Antonieta Pert office   Yes [provider]  lansoprazole (PREVACID) 30 MG capsule Take 1 capsule (30 mg total) by mouth daily at 12 noon. 11/19/16  Yes Copland, Gay Filler, MD  levothyroxine (SYNTHROID, LEVOTHROID) 50 MCG tablet Take 1 tablet (50 mcg total) by mouth daily. 08/09/17  Yes Copland, Gay Filler, MD  losartan (COZAAR) 100 MG tablet TAKE 1 TABLET (100 MG TOTAL) BY MOUTH AT BEDTIME. 08/15/17  Yes Copland, Gay Filler, MD  metoprolol tartrate (LOPRESSOR) 25 MG tablet Take 1 tablet (25 mg total) by mouth 2 (two) times daily. 01/28/17  Yes Volanda Napoleon, MD  naproxen (NAPROSYN) 500 MG tablet Take 1 tablet (500 mg total) by mouth 2 (two) times daily with a meal. 08/16/17  Yes Leandrew Koyanagi, MD  nitroGLYCERIN (NITROSTAT) 0.4 MG SL tablet Place 1 tablet (0.4 mg total) under the tongue every 5 (five) minutes x 3 doses as needed for chest pain. 02/11/16  Yes Adrian Prows, MD  Omega-3 Fatty Acids (FISH OIL) 1000 MG CAPS Take 1 capsule by mouth daily. Reported on 12/30/2015   Yes [provider]  ondansetron  (ZOFRAN) 8 MG tablet Take 1 tablet (8 mg total) by mouth 2 (two) times daily as needed for nausea or vomiting. 08/10/17  Yes Gus Height, MD  predniSONE (DELTASONE) 50 MG tablet Take 50mg  at 13 hours, 7 hours, and 1 hour before scan 02/18/17  Yes [place], Holli Humbles, NP  sodium chloride (OCEAN) 0.65 % SOLN nasal spray Place 1 spray into both nostrils as needed for congestion.    Yes [provider]  ticagrelor (BRILINTA) 90 MG TABS tablet Take 1 tablet (90 mg total) by mouth 2 (two) times daily. 02/11/16  Yes Adrian Prows, MD  tiZANidine (ZANAFLEX) 4 MG tablet Take 1 tablet (4 mg total) by mouth every 6 (six) hours as needed for muscle spasms. 08/16/17  Yes Leandrew Koyanagi, MD  vitamin B-12 (CYANOCOBALAMIN) 1000 MCG tablet  Take 1,000 mcg by mouth daily.   Yes [provider]  diphenhydrAMINE (BENADRYL) 50 MG tablet Take 1 tablet (50 mg total) by mouth once. Take 1 hour prior to scan. 02/18/17 02/18/17  [place], Holli Humbles, NP  hydrocortisone (ANUSOL-HC) 25 MG suppository INSERT 1 SUPPOSITORY RECTALLY TWICE A DAY AS NEEDED FOR HEMORRHOIDS Patient not taking: Reported on 08/25/2017 02/07/15   [place], Holli Humbles, NP  predniSONE (STERAPRED UNI-PAK 21 TAB) 10 MG (21) TBPK tablet Take as directed Patient not taking: Reported on 08/25/2017 08/16/17   Leandrew Koyanagi, MD    Family History Family History  Problem Relation Age of Onset  . Kidney disease Mother   . Hyperlipidemia Mother   . Hypertension Mother   . COPD Father   . Ulcerative colitis Daughter     Social History Social History  Substance Use Topics  . Smoking status: Former Smoker    Packs/day: 1.50    Years: 35.00    Types: Cigarettes    Start date: 09/25/1959    Quit date: 12/21/1997  . Smokeless tobacco: Never Used  . Alcohol use 1.8 - 2.4 oz/week    3 - 4 Cans of beer per week     Comment: 4 beers weekly      Allergies   Iohexol   Review of Systems Review of Systems  Constitutional: Negative for fever.    Respiratory: Negative for cough and shortness of breath.   Cardiovascular: Negative for chest pain.  Gastrointestinal: Positive for diarrhea and nausea. Negative for abdominal pain and vomiting.  Genitourinary: Negative for dysuria.  Musculoskeletal: Positive for back pain.  Neurological: Positive for weakness and light-headedness. Negative for syncope, numbness and headaches.  All other systems reviewed and are negative.    Physical Exam Updated Vital Signs BP 138/73   Pulse 69   Temp 98.1 F (36.7 C) (Oral)   Resp 20   Ht 5\' 10"  (1.778 m)   Wt 93 kg (205 lb)   SpO2 99%   BMI 29.41 kg/m   Physical Exam  Constitutional: He is oriented to person, place, and time. He appears well-developed and well-nourished. No distress.  HENT:  Head: Normocephalic and atraumatic.  Right Ear: External ear normal.  Left Ear: External ear normal.  Nose: Nose normal.  Eyes: Right eye exhibits no discharge. Left eye exhibits no discharge.  Neck: Neck supple.  Cardiovascular: Regular rhythm and normal heart sounds.  Bradycardia present.   Pulmonary/Chest: Effort normal and breath sounds normal.  Abdominal: Soft. He exhibits no distension. There is no tenderness.  Musculoskeletal: He exhibits no edema.  Neurological: He is alert and oriented to person, place, and time.  Skin: Skin is warm and dry. He is not diaphoretic.  Nursing note and vitals reviewed.    ED Treatments / Results  Labs (all labs ordered are listed, but only abnormal results are displayed) Labs Reviewed  BASIC METABOLIC PANEL - Abnormal; Notable for the following:       Result Value   Calcium 8.6 (*)    Anion gap 3 (*)    All other components within normal limits  CBC - Abnormal; Notable for the following:    Hemoglobin 11.8 (*)    HCT 37.5 (*)    RDW 16.3 (*)    Platelets 126 (*)    All other components within normal limits  URINALYSIS, ROUTINE W REFLEX MICROSCOPIC - Abnormal; Notable for the following:     APPearance HAZY (*)    All other  components within normal limits  TSH - Abnormal; Notable for the following:    TSH 4.629 (*)    All other components within normal limits  T4, FREE - Abnormal; Notable for the following:    Free T4 1.15 (*)    All other components within normal limits  CBG MONITORING, ED    EKG  EKG Interpretation  Date/Time:  Thursday August 25 2017 09:47:13 EDT Ventricular Rate:  51 PR Interval:  204 QRS Duration: 74 QT Interval:  422 QTC Calculation: 388 R Axis:   -16 Text Interpretation:  Sinus bradycardia Inferior infarct , age undetermined Anterior infarct , age undetermined Abnormal ECG no significant change since 2017 Confirmed by Sherwood Gambler [phone]) on 08/25/2017 10:34:08 AM       Radiology No results found.  Procedures Procedures (including critical care time)  Medications Ordered in ED Medications  sodium chloride 0.9 % bolus 1,000 mL (0 mLs Intravenous Stopped 08/25/17 1152)  sodium chloride 0.9 % bolus 1,000 mL (0 mLs Intravenous Stopped 08/25/17 1315)     Initial Impression / Assessment and Plan / ED Course  I have reviewed the triage vital signs and the nursing notes.  Pertinent labs & imaging results that were available during my care of the patient were reviewed by me and considered in my medical decision making (see chart for details).     BP was low but in 90s on arrival to ED room. Lightheadedness has resolved and not recurred.  His lab work is unremarkable.  His TSH and free T4 are slightly off but not to the point that would be consistent with severe hypothyroidism.  His mild bradycardia is improved.  There are couple possibilities.  But he could be dehydrated and on blood pressure medicine causing him to get hypotensive.  He does feel much better with IV fluids.  He also could just have better blood pressure control and not need as many blood pressure medicines and just be over treated.  However I am seeing no signs of an acute  infection or sepsis.  No signs of shock.  His blood pressure has not become recurrently hypotensive.  He was able to ambulate around the ED without lightheadedness.  I have discussed having the patient call Dr. Einar Gip this afternoon as he is the one that controls his blood pressure medicine.  Otherwise hold this medicine until seen by him.  Drink plenty of fluids.  Strict return precautions.  Final Clinical Impressions(s) / ED Diagnoses   Final diagnoses:  Hypotension, unspecified hypotension type  Dehydration    New Prescriptions Discharge Medication List as of 08/25/2017  1:07 PM       Sherwood Gambler, MD 08/25/17 1537

## 2017-08-26 ENCOUNTER — Encounter: Payer: Self-pay | Admitting: Hematology & Oncology

## 2017-08-26 ENCOUNTER — Encounter: Payer: Self-pay | Admitting: Family Medicine

## 2017-08-26 DIAGNOSIS — I959 Hypotension, unspecified: Secondary | ICD-10-CM

## 2017-08-27 ENCOUNTER — Other Ambulatory Visit: Payer: Self-pay | Admitting: Nurse Practitioner

## 2017-08-31 ENCOUNTER — Encounter (HOSPITAL_COMMUNITY): Payer: Self-pay | Admitting: Diagnostic Radiology

## 2017-09-05 ENCOUNTER — Ambulatory Visit (HOSPITAL_BASED_OUTPATIENT_CLINIC_OR_DEPARTMENT_OTHER): Payer: Medicare HMO | Admitting: Hematology & Oncology

## 2017-09-05 ENCOUNTER — Other Ambulatory Visit (HOSPITAL_BASED_OUTPATIENT_CLINIC_OR_DEPARTMENT_OTHER): Payer: Medicare HMO

## 2017-09-05 ENCOUNTER — Ambulatory Visit (HOSPITAL_BASED_OUTPATIENT_CLINIC_OR_DEPARTMENT_OTHER): Payer: Medicare HMO

## 2017-09-05 VITALS — BP 113/63 | HR 70 | Temp 98.3°F | Resp 18 | Wt 203.2 lb

## 2017-09-05 DIAGNOSIS — C7B8 Other secondary neuroendocrine tumors: Secondary | ICD-10-CM

## 2017-09-05 DIAGNOSIS — C7A8 Other malignant neuroendocrine tumors: Secondary | ICD-10-CM

## 2017-09-05 DIAGNOSIS — E039 Hypothyroidism, unspecified: Secondary | ICD-10-CM

## 2017-09-05 DIAGNOSIS — D508 Other iron deficiency anemias: Secondary | ICD-10-CM

## 2017-09-05 DIAGNOSIS — E032 Hypothyroidism due to medicaments and other exogenous substances: Secondary | ICD-10-CM

## 2017-09-05 LAB — CBC WITH DIFFERENTIAL (CANCER CENTER ONLY)
BASO#: 0 10*3/uL (ref 0.0–0.2)
BASO%: 0.6 % (ref 0.0–2.0)
EOS%: 3.5 % (ref 0.0–7.0)
Eosinophils Absolute: 0.2 10*3/uL (ref 0.0–0.5)
HEMATOCRIT: 40.1 % (ref 38.7–49.9)
HGB: 12.9 g/dL — ABNORMAL LOW (ref 13.0–17.1)
LYMPH#: 1.6 10*3/uL (ref 0.9–3.3)
LYMPH%: 25.2 % (ref 14.0–48.0)
MCH: 26.8 pg — ABNORMAL LOW (ref 28.0–33.4)
MCHC: 32.2 g/dL (ref 32.0–35.9)
MCV: 83 fL (ref 82–98)
MONO#: 0.4 10*3/uL (ref 0.1–0.9)
MONO%: 6.6 % (ref 0.0–13.0)
NEUT#: 4.1 10*3/uL (ref 1.5–6.5)
NEUT%: 64.1 % (ref 40.0–80.0)
Platelets: 142 10*3/uL — ABNORMAL LOW (ref 145–400)
RBC: 4.81 10*6/uL (ref 4.20–5.70)
RDW: 16.4 % — ABNORMAL HIGH (ref 11.1–15.7)
WBC: 6.4 10*3/uL (ref 4.0–10.0)

## 2017-09-05 LAB — CMP (CANCER CENTER ONLY)
ALK PHOS: 66 U/L (ref 26–84)
ALT: 31 U/L (ref 10–47)
AST: 38 U/L (ref 11–38)
Albumin: 3.6 g/dL (ref 3.3–5.5)
BUN, Bld: 16 mg/dL (ref 7–22)
CALCIUM: 9.9 mg/dL (ref 8.0–10.3)
CO2: 29 mEq/L (ref 18–33)
Chloride: 105 mEq/L (ref 98–108)
Creat: 1.1 mg/dl (ref 0.6–1.2)
GLUCOSE: 139 mg/dL — AB (ref 73–118)
POTASSIUM: 4.2 meq/L (ref 3.3–4.7)
Sodium: 145 mEq/L (ref 128–145)
Total Bilirubin: 1 mg/dl (ref 0.20–1.60)
Total Protein: 8.3 g/dL — ABNORMAL HIGH (ref 6.4–8.1)

## 2017-09-05 LAB — TSH: TSH: 3.82 m(IU)/L (ref 0.320–4.118)

## 2017-09-05 MED ORDER — LANREOTIDE ACETATE 120 MG/0.5ML ~~LOC~~ SOLN
120.0000 mg | Freq: Once | SUBCUTANEOUS | Status: AC
  Administered 2017-09-05: 120 mg via SUBCUTANEOUS

## 2017-09-05 MED ORDER — LANREOTIDE ACETATE 120 MG/0.5ML ~~LOC~~ SOLN
SUBCUTANEOUS | Status: AC
  Filled 2017-09-05: qty 120

## 2017-09-05 NOTE — Progress Notes (Signed)
Hematology and Oncology Follow Up Visit  Brian Mays [id number] May 22, 1946 71 y.o. 09/05/2017   Principle Diagnosis:  Metastatic low grade neuroendocrine tumor-hepatic metastases  Current Therapy:   S/p third yttrium-90 intrahepatic therapy - November 2016 Somatuline 120 mg monthly Lutathera (Lu 177) injection on 08/10/2017 - s/p cycle #1    Interim History: [name] is back for follow-up.  He actually had his first dose of Lutathera about a month ago.  He did well with this.  So far, there is been no ill effects.  The only problem that he has had has been presyncope.  For the past several weeks, he has been getting lightheaded.  He has had a couple episodes that were so bad that he had to stop driving.  He actually went to the emergency room one time.  Because they cannot do his blood pressure quickly, he left.  He is not too happy with his cardiologist.  He wants to change cardiologists.  I will see about making a referral.  He is on 3 different blood pressure medications.  I told him to stop his Norvasc.  I told him to decrease his Lopressor dose to 12.5 mg p.o. twice daily.  He had his last chromogranin A level of 10 a month ago.  He does have hypothyroidism.  We are checking his TSH to see if that might be a problem with his presyncope.  He has had no nausea or vomiting.  He has had no cough.  He has had no diarrhea.  He has had no pain.  There is been no leg swelling.  He has had no rashes.  He has had no fever.  Currently, his performance status is ECOG 1. Medications:  Allergies as of 09/05/2017      Reactions   Iohexol Hives    Code: HIVES, Desc: PER [name] @ [facility]IMARY [facility], PT IS ALLERGIC TO CONTRAST DYE 10/02/08/RM  05/01/10...needs full premeds per our protocol w/ [facility]., Onset Date: [id number]      Medication List        Accurate as of 09/05/17 11:45 AM. Always use your most recent med list.          amLODipine 5 MG tablet Commonly known as:  NORVASC Take 5  mg by mouth daily.   aspirin EC 81 MG tablet Take 81 mg by mouth every morning.   atorvastatin 20 MG tablet Commonly known as:  LIPITOR Take 20 mg daily by mouth.   Cholecalciferol 1000 units tablet Take 1,000 Units by mouth daily.   diphenhydrAMINE 50 MG tablet Commonly known as:  BENADRYL Take 1 tablet (50 mg total) by mouth once. Take 1 hour prior to scan.   Fish Oil 1000 MG Caps Take 1 capsule by mouth daily. Reported on 12/30/2015   fluticasone 50 MCG/ACT nasal spray Commonly known as:  FLONASE Place 2 sprays into both nostrils daily as needed (sinuses).   hydrocortisone 25 MG suppository Commonly known as:  ANUSOL-HC INSERT 1 SUPPOSITORY RECTALLY TWICE A DAY AS NEEDED FOR HEMORRHOIDS   lansoprazole 30 MG capsule Commonly known as:  PREVACID Take 1 capsule (30 mg total) by mouth daily at 12 noon.   levothyroxine 50 MCG tablet Commonly known as:  SYNTHROID, LEVOTHROID Take 1 tablet (50 mcg total) by mouth daily.   losartan 100 MG tablet Commonly known as:  COZAAR TAKE 1 TABLET (100 MG TOTAL) BY MOUTH AT BEDTIME.   metoprolol tartrate 25 MG tablet Commonly known as:  LOPRESSOR  Take 1 tablet (25 mg total) by mouth 2 (two) times daily.   naproxen 500 MG tablet Commonly known as:  NAPROSYN Take 1 tablet (500 mg total) by mouth 2 (two) times daily with a meal.   nitroGLYCERIN 0.4 MG SL tablet Commonly known as:  NITROSTAT Place 1 tablet (0.4 mg total) under the tongue every 5 (five) minutes x 3 doses as needed for chest pain.   predniSONE 50 MG tablet Commonly known as:  DELTASONE Take 50mg  at 13 hours, 7 hours, and 1 hour before scan   sodium chloride 0.65 % Soln nasal spray Commonly known as:  OCEAN Place 1 spray into both nostrils as needed for congestion.   SOMATULINE DEPOT [place] Inject 120 mcg into the skin every 28 (twenty-eight) days. Receives at Dr Antonieta Pert office   ticagrelor 90 MG Tabs tablet Commonly known as:  BRILINTA Take 1 tablet (90 mg  total) by mouth 2 (two) times daily.   tiZANidine 4 MG tablet Commonly known as:  ZANAFLEX Take 1 tablet (4 mg total) by mouth every 6 (six) hours as needed for muscle spasms.   vitamin B-12 1000 MCG tablet Commonly known as:  CYANOCOBALAMIN Take 1,000 mcg by mouth daily.       Allergies:  Allergies  Allergen Reactions  . Iohexol Hives     Code: HIVES, Desc: PER [name] @ [facility], PT IS ALLERGIC TO CONTRAST DYE 10/02/08/RM  05/01/10...needs full premeds per our protocol w/ [facility]., Onset Date: [id number]     Past Medical History, Surgical history, Social history, and Family History were reviewed and updated.  Review of Systems: Review of Systems  Constitutional: Negative for appetite change, fatigue, fever and unexpected weight change.  HENT:   Negative for lump/mass, mouth sores, sore throat and trouble swallowing.   Respiratory: Negative for cough, hemoptysis and shortness of breath.   Cardiovascular: Negative for leg swelling and palpitations.  Gastrointestinal: Negative for abdominal distention, abdominal pain, blood in stool, constipation, diarrhea, nausea and vomiting.  Genitourinary: Negative for bladder incontinence, dysuria, frequency and hematuria.   Musculoskeletal: Negative for arthralgias, back pain, gait problem and myalgias.  Skin: Negative for itching and rash.  Neurological: Negative for dizziness, extremity weakness, gait problem, headaches, numbness, seizures and speech difficulty.  Hematological: Does not bruise/bleed easily.  Psychiatric/Behavioral: Negative for depression and sleep disturbance. The patient is not nervous/anxious.      Physical Exam:  weight is 203 lb 4 oz (92.2 kg). His oral temperature is 98.3 F (36.8 C). His blood pressure is 113/63 and his pulse is 70. His respiration is 18 and oxygen saturation is 98%.   Wt Readings from Last 3 Encounters:  09/05/17 203 lb 4 oz (92.2 kg)  08/25/17 205 lb (93 kg)  07/25/17 205 lb (93 kg)     Physical Exam  Constitutional: He is oriented to person, place, and time.  HENT:  Head: Normocephalic and atraumatic.  Mouth/Throat: Oropharynx is clear and moist.  Eyes: EOM are normal. Pupils are equal, round, and reactive to light.  Neck: Normal range of motion.  Cardiovascular: Normal rate, regular rhythm and normal heart sounds.  Pulmonary/Chest: Effort normal and breath sounds normal.  Abdominal: Soft. Bowel sounds are normal.  Musculoskeletal: Normal range of motion. He exhibits no edema, tenderness or deformity.  Lymphadenopathy:    He has no cervical adenopathy.  Neurological: He is alert and oriented to person, place, and time.  Skin: Skin is warm and dry. No rash noted. No erythema.  Psychiatric: He has a normal mood and affect. His behavior is normal. Judgment and thought content normal.  Vitals reviewed. .   Lab Results  Component Value Date   WBC 6.4 09/05/2017   HGB 12.9 (L) 09/05/2017   HCT 40.1 09/05/2017   MCV 83 09/05/2017   PLT 142 (L) 09/05/2017   No results found for: FERRITIN, IRON, TIBC, UIBC, IRONPCTSAT Lab Results  Component Value Date   RBC 4.81 09/05/2017   No results found for: KPAFRELGTCHN, LAMBDASER, KAPLAMBRATIO No results found for: IGGSERUM, IGA, IGMSERUM No results found for: Odetta Pink, SPEI   Chemistry      Component Value Date/Time   NA 145 09/05/2017 1015   NA 141 07/23/2016 1255   K 4.2 09/05/2017 1015   K 4.1 07/23/2016 1255   CL 105 09/05/2017 1015   CO2 29 09/05/2017 1015   CO2 27 07/23/2016 1255   BUN 16 09/05/2017 1015   BUN 13.8 07/23/2016 1255   CREATININE 1.1 09/05/2017 1015   CREATININE 1.0 07/23/2016 1255      Component Value Date/Time   CALCIUM 9.9 09/05/2017 1015   CALCIUM 9.2 07/23/2016 1255   ALKPHOS 66 09/05/2017 1015   ALKPHOS 77 07/23/2016 1255   AST 38 09/05/2017 1015   AST 23 07/23/2016 1255   ALT 31 09/05/2017 1015   ALT 13 07/23/2016 1255    BILITOT 1.00 09/05/2017 1015   BILITOT 1.13 07/23/2016 1255     Impression and Plan: Mr. Cu is A 71 year old white male. He has metastatic low-grade neuroendocrine carcinoma. He has hepatic metastases.  He has not been rescanned yet.  I think radiology is in charge of this.  We will go ahead and give him his Somatuline today.  We will see about referring to cardiology.  I will make a referral to Dr. Liam Rogers.    I spent about 30 minutes with Mr. Lantry today.  Most the time, we dealt with his presyncope.   Burney Gauze, MD  11/5/201811:45 AM

## 2017-09-05 NOTE — Patient Instructions (Signed)
Lanreotide injection What is this medicine? LANREOTIDE (lan REE oh tide) is used to reduce blood levels of growth hormone in patients with a condition called acromegaly. It also works to slow or stop tumor growth in patients with neuroendocrine tumors and treat carcinoid syndrome. This medicine may be used for other purposes; ask your health care provider or pharmacist if you have questions. COMMON BRAND NAME(S): Somatuline [place] What should I tell my health care provider before I take this medicine? They need to know if you have any of these conditions: -diabetes -gallbladder disease -heart disease -kidney disease -liver disease -thyroid disease -an unusual or allergic reaction to lanreotide, other medicines, foods, dyes, or preservatives -pregnant or trying to get pregnant -breast-feeding How should I use this medicine? This medicine is for injection under the skin. It is given by a health care professional in a hospital or clinic setting. Contact your pediatrician or health care professional regarding the use of this medicine in children. Special care may be needed. Overdosage: If you think you have taken too much of this medicine contact a poison control center or emergency room at once. NOTE: This medicine is only for you. Do not share this medicine with others. What if I miss a dose? It is important not to miss your dose. Call your doctor or health care professional if you are unable to keep an appointment. What may interact with this medicine? This medicine may interact with the following medications: -bromocriptine -cyclosporine -certain medicines for blood pressure, heart disease, irregular heart beat -certain medicines for diabetes -quinidine -terfenadine This list may not describe all possible interactions. Give your health care provider a list of all the medicines, herbs, non-prescription drugs, or dietary supplements you use. Also tell them if you smoke, drink alcohol, or  use illegal drugs. Some items may interact with your medicine. What should I watch for while using this medicine? Tell your doctor or healthcare professional if your symptoms do not start to get better or if they get worse. Visit your doctor or health care professional for regular checks on your progress. Your condition will be monitored carefully while you are receiving this medicine. You may need blood work done while you are taking this medicine. Women should inform their doctor if they wish to become pregnant or think they might be pregnant. There is a potential for serious side effects to an unborn child. Talk to your health care professional or pharmacist for more information. Do not breast-feed an infant while taking this medicine or for 6 months after stopping it. This medicine has caused ovarian failure in some women. This medicine may interfere with the ability to have a child. Talk with your doctor or health care professional if you are concerned about your fertility. What side effects may I notice from receiving this medicine? Side effects that you should report to your doctor or health care professional as soon as possible: -allergic reactions like skin rash, itching or hives, swelling of the face, lips, or tongue -increased blood pressure -severe stomach pain -signs and symptoms of high blood sugar such as dizziness; dry mouth; dry skin; fruity breath; nausea; stomach pain; increased hunger or thirst; increased urination -signs and symptoms of low blood sugar such as feeling anxious; confusion; dizziness; increased hunger; unusually weak or tired; sweating; shakiness; cold; irritable; headache; blurred vision; fast heartbeat; loss of consciousness -unusually slow heartbeat Side effects that usually do not require medical attention (report to your doctor or health care professional if they continue   or are bothersome): -constipation -diarrhea -dizziness -headache -muscle pain -muscle  spasms -nausea -pain, redness, or irritation at site where injected This list may not describe all possible side effects. Call your doctor for medical advice about side effects. You may report side effects to FDA at 1-800-FDA-1088. Where should I keep my medicine? This drug is given in a hospital or clinic and will not be stored at home. NOTE: This sheet is a summary. It may not cover all possible information. If you have questions about this medicine, talk to your doctor, pharmacist, or health care provider.  2018 Elsevier/Gold Standard (2016-07-23 10:33:47)  

## 2017-09-07 ENCOUNTER — Telehealth: Payer: Self-pay | Admitting: Nurse Practitioner

## 2017-09-07 LAB — CHROMOGRANIN A: Chromogranin A: 16 nmol/L — ABNORMAL HIGH (ref 0–5)

## 2017-09-07 NOTE — Telephone Encounter (Signed)
Nahser, Wonda Cheng, MD  Swinyer, Lanice Schwab, RN        Chelle,   Please set up Brian Mays for a new patient visit to help stabilize his BP   Thanks so much   Phil    Left message for patient to call back

## 2017-09-12 NOTE — Telephone Encounter (Signed)
Spoke with patient who states when he called back he was told that Dr. Acie Fredrickson was not accepting new patients. I apologized for the incorrect information he received and scheduled him to see Dr. Acie Fredrickson on Wednesday 11/14. He states he has not had good response from Dr. Andria Meuse office with questions regarding his medications and therapy and is ready to see someone new. He thanked me for the call.

## 2017-09-14 ENCOUNTER — Encounter: Payer: Self-pay | Admitting: Cardiovascular Disease

## 2017-09-14 ENCOUNTER — Ambulatory Visit: Payer: Medicare HMO | Admitting: Cardiovascular Disease

## 2017-09-14 VITALS — BP 135/70 | HR 78 | Ht 70.5 in | Wt 204.8 lb

## 2017-09-14 DIAGNOSIS — I2510 Atherosclerotic heart disease of native coronary artery without angina pectoris: Secondary | ICD-10-CM | POA: Diagnosis not present

## 2017-09-14 DIAGNOSIS — I10 Essential (primary) hypertension: Secondary | ICD-10-CM

## 2017-09-14 DIAGNOSIS — I951 Orthostatic hypotension: Secondary | ICD-10-CM

## 2017-09-14 MED ORDER — METOPROLOL TARTRATE 25 MG PO TABS: 12.5000 mg | ORAL_TABLET | Freq: Two times a day (BID) | ORAL | 3 refills | Status: DC

## 2017-09-14 NOTE — Progress Notes (Signed)
Cardiology Consultation:   Patient ID: Brian Mays; [id number]; Jul 23, 1946   Admit date: (Not on file) Date of Consult: 09/14/2017  Primary Care Provider: Darreld Mclean, MD Primary Cardiologist:  Nahser  Primary Electrophysiologist:     Patient Profile:   ALECXIS Mays is a 71 y.o. male with a hx of HTN, carcinoid , , coronary artery disease with a recent episode of weakness and near syncope.  who is being seen today for the evaluation of HTN  at the request of Dr. Marin Olp. He sees Dr. Marin Olp for Carcinoid   History of Present Illness:   [name] is seen wife wife , [name] today   Hx of coronary stenting in March 2017 .   Was started on Metoprolol, he has had intermittent weakness and dizziness/lightheadedness since that time.  He eats and drinks regularly.   Generally gets some regular exercise.  Has had some back issues recently.  He still goes to the line does some exercises that do not cause further back pain.  Dr. Marin Olp asked him to reduce his BP meds.   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  [facility] . Does have some lightheadedness after working out .  No CP or dyspnea while working out.   No synmptoms similar to his MI.    He stopped the Losartan last week He decreased the metoprolol to 12. 5 mg BID last week.   Still eats salty foods. Wife cooks from [employer] .  Eats some pretzels.   Has flushing related to Carcinoid.    Typically occurs if he drinks some beer .    Past Medical History:  Diagnosis Date  . Anemia    in past  . Colon polyps   . Diverticulosis   . Gallstones   . GERD (gastroesophageal reflux disease)   . Heart attack ([place])    mild, Spring 2017  . Heart murmur   . Hemorrhoid   . Hiatal hernia   . HTN (hypertension)   . Hypothyroidism   . Metastatic carcinoma ([place]) 2010   Dr Jonette Eva  . Pancreatitis 1998   chronic     Past Surgical History:  Procedure Laterality Date  . APPENDECTOMY  1962  . CATARACT EXTRACTION W/ INTRAOCULAR LENS  IMPLANT, BILATERAL Bilateral   . IR GENERIC HISTORICAL  12/30/2015   IR RADIOLOGIST EVAL & MGMT 12/30/2015 Aletta Edouard, MD [facility] INTERV RAD  . IR GENERIC HISTORICAL  11/09/2016   IR RADIOLOGIST EVAL & MGMT 11/09/2016 Aletta Edouard, MD [facility] INTERV RAD  . IR RADIOLOGIST EVAL & MGMT  03/01/2017  . IR RADIOLOGIST EVAL & MGMT  06/29/2017  . LAPAROSCOPIC CHOLECYSTECTOMY  1999  . LIVER BIOPSY  2010  . RADIOACTIVE SEED IMPLANT  X 3   "to my liver"  . TUMOR EXCISION  01/2009   Carcinoil Resection   . TUMOR REMOVAL     from small intestine     Home Medications:  Prior to Admission medications   Medication Sig Start Date End Date Taking? Authorizing Provider  amLODipine (NORVASC) 5 MG tablet Take 5 mg by mouth daily. 03/16/17  Yes [provider]  aspirin EC 81 MG tablet Take 81 mg by mouth every morning.   Yes [provider]  atorvastatin (LIPITOR) 20 MG tablet Take 20 mg daily by mouth.   Yes [provider]  Cholecalciferol 1000 UNITS tablet Take 1,000 Units by mouth daily.     Yes [provider]  fluticasone (FLONASE) 50 MCG/ACT nasal spray Place 2 sprays into both nostrils daily as needed (sinuses).  01/06/15  Yes [provider]  hydrocortisone (ANUSOL-HC) 25 MG suppository INSERT 1 SUPPOSITORY RECTALLY TWICE A DAY AS NEEDED FOR HEMORRHOIDS 02/07/15  Yes [place], Holli Humbles, NP  Lanreotide Acetate (SOMATULINE DEPOT [place]) Inject 120 mcg into the skin every 28 (twenty-eight) days. Receives at Dr Antonieta Pert office   Yes [provider]  lansoprazole (PREVACID) 30 MG capsule Take 1 capsule (30 mg total) by mouth daily at 12 noon. 11/19/16  Yes [place], Gay Filler, MD  levothyroxine (SYNTHROID, LEVOTHROID) 50 MCG tablet Take 1 tablet (50 mcg total) by mouth daily. 08/09/17  Yes Cop[place], Gay Filler, MD  naproxen (NAPROSYN) 500 MG  tablet Take 1 tablet (500 mg total) by mouth 2 (two) times daily with a meal. 08/16/17  Yes Leandrew Koyanagi, MD  nitroGLYCERIN (NITROSTAT) 0.4 MG SL tablet Place 1 tablet (0.4 mg total) under the tongue every 5 (five) minutes x 3 doses as needed for chest pain. 02/11/16  Yes Adrian Prows, MD  Omega-3 Fatty Acids (FISH OIL) 1000 MG CAPS Take 1 capsule by mouth daily. Reported on 12/30/2015   Yes [provider]  predniSONE (DELTASONE) 50 MG tablet Take 50mg  at 13 hours, 7 hours, and 1 hour before scan 02/18/17  Yes [place], Holli Humbles, NP  sodium chloride (OCEAN) 0.65 % SOLN nasal spray Place 1 spray into both nostrils as needed for congestion.    Yes [provider]  ticagrelor (BRILINTA) 90 MG TABS tablet Take 1 tablet (90 mg total) by mouth 2 (two) times daily. 02/11/16  Yes Adrian Prows, MD  tiZANidine (ZANAFLEX) 4 MG tablet Take 1 tablet (4 mg total) by mouth every 6 (six) hours as needed for muscle spasms. 08/16/17  Yes Leandrew Koyanagi, MD  vitamin B-12 (CYANOCOBALAMIN) 1000 MCG tablet Take 1,000 mcg by mouth daily.   Yes [provider]  diphenhydrAMINE (BENADRYL) 50 MG tablet Take 1 tablet (50 mg total) by mouth once. Take 1 hour prior to scan. 02/18/17 02/18/17  [place], Holli Humbles, NP  metoprolol tartrate (LOPRESSOR) 25 MG tablet Take 0.5 tablets (12.5 mg total) 2 (two) times daily by mouth. 09/14/17   Nahser, Wonda Cheng, MD    Inpatient Medications: Scheduled Meds:  Continuous Infusions:  PRN Meds:   Allergies:    Allergies  Allergen Reactions  . Iohexol Hives     Code: HIVES, Desc: PER [name] @ [facility]IMARY [facility], PT IS ALLERGIC TO CONTRAST DYE 10/02/08/RM  05/01/10...needs full premeds per our protocol w/ [facility]., Onset Date: 16109604     Social History:   Social History   Socioeconomic History  . Marital status: Married    Spouse name: Not on file  . Number of children: 1  . Years of education: Not on file  . Highest education level: Not on file  Social Needs   . Financial resource strain: Not on file  . Food insecurity - worry: Not on file  . Food insecurity - inability: Not on file  . Transportation needs - medical: Not on file  . Transportation needs - non-medical: Not on file  Occupational History  . 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  Tobacco Use  . Smoking status: Former Smoker    Packs/day: 1.50    Years: 35.00  Pack years: 52.50    Types: Cigarettes    Start date: 09/25/1959    Last attempt to quit: 12/21/1997    Years since quitting: 19.7  . Smokeless tobacco: Never Used  Substance and Sexual Activity  . Alcohol use: Yes    Alcohol/week: 1.8 - 2.4 oz    Types: 3 - 4 Cans of beer per week    Comment: 4 beers weekly   . Drug use: No  . Sexual activity: Yes  Other Topics Concern  . Not on file  Social History Narrative   Regular Exercise -  NO    Family History:    Family History  Problem Relation Age of Onset  . Kidney disease Mother   . Hyperlipidemia Mother   . Hypertension Mother   . COPD Father   . Ulcerative colitis Daughter      ROS:  Please see the history of present illness.  ROS  All other ROS reviewed and negative.     Physical Exam/Data:   Vitals:   09/14/17 1018 09/14/17 1059  BP: (!) 162/60 135/70  Pulse: 78   Weight: 204 lb 12.8 oz (92.9 kg)   Height: 5' 10.5" (1.791 m)    @IOBRIEF @ [employer]   09/14/17 1018  Weight: 204 lb 12.8 oz (92.9 kg)   Body mass index is 28.97 kg/m.  General:  Well nourished, well developed, in no acute distress HEENT: normal Lymph: no adenopathy Neck: no JVD Endocrine:  No thryomegaly Vascular: No carotid bruits; FA pulses 2+ bilaterally without bruits  Cardiac:  normal S1, S2; RRR; no murmur  Lungs:  clear to auscultation bilaterally, no wheezing, rhonchi or rales  Abd: soft, nontender, no hepatomegaly  Ext: no edema Musculoskeletal:  No deformities, BUE and BLE strength normal and equal Skin: warm and dry  Neuro:  CNs 2-12  intact, no focal abnormalities noted Psych:  Normal affect   EKG:    Telemetry:    Relevant CV Studies:    Laboratory Data:  ChemistryNo results for input(s): NA, K, CL, CO2, GLUCOSE, BUN, CREATININE, CALCIUM, GFRNONAA, GFRAA, ANIONGAP in the last 168 hours.  No results for input(s): PROT, ALBUMIN, AST, ALT, ALKPHOS, BILITOT in the last 168 hours. HematologyNo results for input(s): WBC, RBC, HGB, HCT, MCV, MCH, MCHC, RDW, PLT in the last 168 hours. Cardiac EnzymesNo results for input(s): TROPONINI in the last 168 hours. No results for input(s): TROPIPOC in the last 168 hours.  BNPNo results for input(s): BNP, PROBNP in the last 168 hours.  DDimer No results for input(s): DDIMER in the last 168 hours.  Radiology/Studies:  No results found.  Assessment and Plan:   1. Orthostatic hypotension: Neeraj presents with episodes of orthostatic hypotension.  He is feeling much better after decreasing his metoprolol to 12.5 mg twice a day and stopping the losartan.  He notices lots of orthostatic hypotension after exercising.  It is clear that he is not hydrating well enough during his exercise.  Recommended that he drink fluids throughout his  workout routine.   2. Coronary artery disease: He is doing well.  He is not having any angina.  He is still on Brilinta 90 mg twice a day.  This medicine is fairly expensive.  We discussed possibly decreasing the dose to 60 mg but this would still be about the same cost.  We will anticipate changing to Plavix 75 mg a day when he runs out of the his current prescription which will be in about 2  months.  He will call back to the office and we will make the change at that time.  We will check fasting lipids when I see him again in 3 months.    For questions or updates, please contact [place] Please consult [email or web address] for contact info under Cardiology/STEMI.   Signed, Mertie Moores, MD  09/14/2017 10:59 AM    .Horace Porteous

## 2017-09-14 NOTE — Patient Instructions (Signed)
Medication Instructions:  Your physician has recommended you make the following change in your medication:   DECREASE Metoprolol (lopressor) to 12.5 mg twice daily STOP Losartan   Labwork: Your physician recommends that you return for lab work in: 3 months on the day of or a few days before your office visit with Dr. Acie Fredrickson.  You will need to FAST for this appointment - nothing to eat or drink after midnight the night before except water.    Testing/Procedures: None Ordered   Follow-Up: Your physician recommends that you schedule a follow-up appointment in: 3 months with Dr. Acie Fredrickson   If you need a refill on your cardiac medications before your next appointment, please call your pharmacy.   Thank you for choosing [facility]! Christen Bame, RN [phone]

## 2017-09-28 ENCOUNTER — Telehealth: Payer: Self-pay | Admitting: Hematology & Oncology

## 2017-09-28 NOTE — Telephone Encounter (Signed)
Scheduled appt per 11/28 sch message - patient is aware of appt date and time.

## 2017-10-03 ENCOUNTER — Telehealth: Payer: Self-pay | Admitting: Cardiovascular Disease

## 2017-10-03 NOTE — Telephone Encounter (Signed)
Agree with note by Michelle Swinyer, RN  

## 2017-10-03 NOTE — Telephone Encounter (Signed)
New message,    Patient calling with BP concerns.   Pt c/o BP issue:  1. What are your last 5 BP readings? 181/91, 170/88, 177/87, 165/82 2. Are you having any other symptoms (ex. Dizziness, headache, blurred vision, passed out)? NO 3. What is your medication issue? Patient states BP too high, wants medication adjusted.

## 2017-10-03 NOTE — Telephone Encounter (Signed)
Spoke with patient who states he has not been monitoring BP regularly at home until last Thursday and since then has noticed that BP readings are high. He states his BP readings at the [facility] have been good after working out (reports systolic readings 342-876'O mmHg). I asked him about the technique of his BP monitoring and he says he generally takes his BP sitting in his recliner. He does not know if his machine has fresh batteries. I advised him to follow good technique:  HOW TO TAKE YOUR BLOOD PRESSURE:  Rest 5 minutes before taking your blood pressure.   Don't smoke or drink caffeinated beverages for at least 30 minutes before.  Take your blood pressure before (not after) you eat.  Sit comfortably with your back supported and both feet on the floor (don't cross your legs).  Elevate your arm to heart level on a table or a desk.  Use the proper sized cuff. It should fit smoothly and snugly around your bare upper arm. There should be enough room to slip a fingertip under the cuff. The bottom edge of the cuff should be 1 inch above the crease of the elbow.  I advised him to continue to monitor and to call back in a few days to report if BP remains elevated. He states Dr. Acie Fredrickson mentioned in the past that he would rather have him on Losartan than Amlodipine. I advised that we will discuss the change if BP remains elevated. He verbalized understanding and agreement and thanked me for the call.

## 2017-10-04 ENCOUNTER — Other Ambulatory Visit (HOSPITAL_BASED_OUTPATIENT_CLINIC_OR_DEPARTMENT_OTHER): Payer: Medicare HMO

## 2017-10-04 ENCOUNTER — Ambulatory Visit (HOSPITAL_BASED_OUTPATIENT_CLINIC_OR_DEPARTMENT_OTHER): Payer: Medicare HMO | Admitting: Hematology & Oncology

## 2017-10-04 ENCOUNTER — Other Ambulatory Visit: Payer: Self-pay

## 2017-10-04 ENCOUNTER — Encounter: Payer: Self-pay | Admitting: Hematology & Oncology

## 2017-10-04 ENCOUNTER — Ambulatory Visit: Payer: Medicare HMO

## 2017-10-04 VITALS — BP 125/65 | HR 78 | Temp 98.4°F | Resp 20 | Wt 206.2 lb

## 2017-10-04 DIAGNOSIS — C7A8 Other malignant neuroendocrine tumors: Secondary | ICD-10-CM

## 2017-10-04 DIAGNOSIS — E032 Hypothyroidism due to medicaments and other exogenous substances: Secondary | ICD-10-CM

## 2017-10-04 DIAGNOSIS — C7B8 Other secondary neuroendocrine tumors: Secondary | ICD-10-CM | POA: Diagnosis not present

## 2017-10-04 DIAGNOSIS — D508 Other iron deficiency anemias: Secondary | ICD-10-CM | POA: Diagnosis not present

## 2017-10-04 LAB — CBC WITH DIFFERENTIAL (CANCER CENTER ONLY)
BASO#: 0 10*3/uL (ref 0.0–0.2)
BASO%: 0.5 % (ref 0.0–2.0)
EOS%: 5.9 % (ref 0.0–7.0)
Eosinophils Absolute: 0.4 10*3/uL (ref 0.0–0.5)
HCT: 37.1 % — ABNORMAL LOW (ref 38.7–49.9)
HGB: 12.2 g/dL — ABNORMAL LOW (ref 13.0–17.1)
LYMPH#: 1.2 10*3/uL (ref 0.9–3.3)
LYMPH%: 19.3 % (ref 14.0–48.0)
MCH: 27.3 pg — ABNORMAL LOW (ref 28.0–33.4)
MCHC: 32.9 g/dL (ref 32.0–35.9)
MCV: 83 fL (ref 82–98)
MONO#: 0.5 10*3/uL (ref 0.1–0.9)
MONO%: 7.6 % (ref 0.0–13.0)
NEUT#: 4.2 10*3/uL (ref 1.5–6.5)
NEUT%: 66.7 % (ref 40.0–80.0)
PLATELETS: 158 10*3/uL (ref 145–400)
RBC: 4.47 10*6/uL (ref 4.20–5.70)
RDW: 16.5 % — ABNORMAL HIGH (ref 11.1–15.7)
WBC: 6.2 10*3/uL (ref 4.0–10.0)

## 2017-10-04 LAB — IRON AND TIBC
%SAT: 15 % — ABNORMAL LOW (ref 20–55)
Iron: 54 ug/dL (ref 42–163)
TIBC: 373 ug/dL (ref 202–409)
UIBC: 318 ug/dL (ref 117–376)

## 2017-10-04 LAB — CMP (CANCER CENTER ONLY)
ALK PHOS: 64 U/L (ref 26–84)
ALT: 22 U/L (ref 10–47)
AST: 32 U/L (ref 11–38)
Albumin: 3.4 g/dL (ref 3.3–5.5)
BILIRUBIN TOTAL: 0.8 mg/dL (ref 0.20–1.60)
BUN, Bld: 14 mg/dL (ref 7–22)
CO2: 28 mEq/L (ref 18–33)
Calcium: 9.4 mg/dL (ref 8.0–10.3)
Chloride: 103 mEq/L (ref 98–108)
Creat: 0.8 mg/dl (ref 0.6–1.2)
GLUCOSE: 103 mg/dL (ref 73–118)
Potassium: 3.8 mEq/L (ref 3.3–4.7)
SODIUM: 144 meq/L (ref 128–145)
TOTAL PROTEIN: 7.6 g/dL (ref 6.4–8.1)

## 2017-10-04 LAB — FERRITIN: Ferritin: 35 ng/ml (ref 22–316)

## 2017-10-04 NOTE — Progress Notes (Signed)
Hematology and Oncology Follow Up Visit  Brian Mays [id number] 03-May-1946 71 y.o. 09/05/2017   Principle Diagnosis:  Metastatic low grade neuroendocrine tumor-hepatic metastases  Current Therapy:   S/p third yttrium-90 intrahepatic therapy - November 2016 Somatuline 120 mg monthly Lutathera (Lu 177) injection on 10/05/2017 - s/p cycle #2    Interim History: Brian Mays is back for follow-up.  He is doing much better.  We adjusted his blood pressure medications.  He is seeing a new cardiologist.  He feels much better.  He did not have any problems with nausea or vomiting.  He has had no dizziness.  He is due for his next Lutathera treatment tomorrow.  He will get his Somatuline injection tomorrow.    He has had no flushing.  He has had no rashes.  He has had no leg swelling.  He has had no fever.    Currently, his performance status is ECOG 1.  Medications:  Allergies as of 09/05/2017      Reactions   Iohexol Hives    Code: HIVES, Desc: PER [name] @ [facility], PT IS ALLERGIC TO CONTRAST DYE 10/02/08/RM  05/01/10...needs full premeds per our protocol w/ [facility]., Onset Date: [id number]      Medication List        Accurate as of 09/05/17 11:45 AM. Always use your most recent med list.          amLODipine 5 MG tablet Commonly known as:  NORVASC Take 5 mg by mouth daily.   aspirin EC 81 MG tablet Take 81 mg by mouth every morning.   atorvastatin 20 MG tablet Commonly known as:  LIPITOR Take 20 mg daily by mouth.   Cholecalciferol 1000 units tablet Take 1,000 Units by mouth daily.   diphenhydrAMINE 50 MG tablet Commonly known as:  BENADRYL Take 1 tablet (50 mg total) by mouth once. Take 1 hour prior to scan.   Fish Oil 1000 MG Caps Take 1 capsule by mouth daily. Reported on 12/30/2015   fluticasone 50 MCG/ACT nasal spray Commonly known as:  FLONASE Place 2 sprays into both nostrils daily as needed (sinuses).   hydrocortisone 25 MG suppository Commonly known as:   ANUSOL-HC INSERT 1 SUPPOSITORY RECTALLY TWICE A DAY AS NEEDED FOR HEMORRHOIDS   lansoprazole 30 MG capsule Commonly known as:  PREVACID Take 1 capsule (30 mg total) by mouth daily at 12 noon.   levothyroxine 50 MCG tablet Commonly known as:  SYNTHROID, LEVOTHROID Take 1 tablet (50 mcg total) by mouth daily.   losartan 100 MG tablet Commonly known as:  COZAAR TAKE 1 TABLET (100 MG TOTAL) BY MOUTH AT BEDTIME.   metoprolol tartrate 25 MG tablet Commonly known as:  LOPRESSOR Take 1 tablet (25 mg total) by mouth 2 (two) times daily.   naproxen 500 MG tablet Commonly known as:  NAPROSYN Take 1 tablet (500 mg total) by mouth 2 (two) times daily with a meal.   nitroGLYCERIN 0.4 MG SL tablet Commonly known as:  NITROSTAT Place 1 tablet (0.4 mg total) under the tongue every 5 (five) minutes x 3 doses as needed for chest pain.   predniSONE 50 MG tablet Commonly known as:  DELTASONE Take 50mg  at 13 hours, 7 hours, and 1 hour before scan   sodium chloride 0.65 % Soln nasal spray Commonly known as:  OCEAN Place 1 spray into both nostrils as needed for congestion.   SOMATULINE DEPOT [place] Inject 120 mcg into the skin every 28 (twenty-eight) days.  Receives at Dr Antonieta Pert office   ticagrelor 90 MG Tabs tablet Commonly known as:  BRILINTA Take 1 tablet (90 mg total) by mouth 2 (two) times daily.   tiZANidine 4 MG tablet Commonly known as:  ZANAFLEX Take 1 tablet (4 mg total) by mouth every 6 (six) hours as needed for muscle spasms.   vitamin B-12 1000 MCG tablet Commonly known as:  CYANOCOBALAMIN Take 1,000 mcg by mouth daily.       Allergies:  Allergies  Allergen Reactions  . Iohexol Hives     Code: HIVES, Desc: PER [name] @ [facility], PT IS ALLERGIC TO CONTRAST DYE 10/02/08/RM  05/01/10...needs full premeds per our protocol w/ [facility]., Onset Date: [id number]     Past Medical History, Surgical history, Social history, and Family History were reviewed and updated.  Review  of Systems: Review of Systems  Constitutional: Negative for appetite change, fatigue, fever and unexpected weight change.  HENT:   Negative for lump/mass, mouth sores, sore throat and trouble swallowing.   Respiratory: Negative for cough, hemoptysis and shortness of breath.   Cardiovascular: Negative for leg swelling and palpitations.  Gastrointestinal: Negative for abdominal distention, abdominal pain, blood in stool, constipation, diarrhea, nausea and vomiting.  Genitourinary: Negative for bladder incontinence, dysuria, frequency and hematuria.   Musculoskeletal: Negative for arthralgias, back pain, gait problem and myalgias.  Skin: Negative for itching and rash.  Neurological: Negative for dizziness, extremity weakness, gait problem, headaches, numbness, seizures and speech difficulty.  Hematological: Does not bruise/bleed easily.  Psychiatric/Behavioral: Negative for depression and sleep disturbance. The patient is not nervous/anxious.      Physical Exam:  weight is 203 lb 4 oz (92.2 kg). His oral temperature is 98.3 F (36.8 C). His blood pressure is 113/63 and his pulse is 70. His respiration is 18 and oxygen saturation is 98%.   Wt Readings from Last 3 Encounters:  09/05/17 203 lb 4 oz (92.2 kg)  08/25/17 205 lb (93 kg)  07/25/17 205 lb (93 kg)    Physical Exam  Constitutional: He is oriented to person, place, and time.  HENT:  Head: Normocephalic and atraumatic.  Mouth/Throat: Oropharynx is clear and moist.  Eyes: EOM are normal. Pupils are equal, round, and reactive to light.  Neck: Normal range of motion.  Cardiovascular: Normal rate, regular rhythm and normal heart sounds.  Pulmonary/Chest: Effort normal and breath sounds normal.  Abdominal: Soft. Bowel sounds are normal.  Musculoskeletal: Normal range of motion. He exhibits no edema, tenderness or deformity.  Lymphadenopathy:    He has no cervical adenopathy.  Neurological: He is alert and oriented to person, place,  and time.  Skin: Skin is warm and dry. No rash noted. No erythema.  Psychiatric: He has a normal mood and affect. His behavior is normal. Judgment and thought content normal.  Vitals reviewed. .   Lab Results  Component Value Date   WBC 6.4 09/05/2017   HGB 12.9 (L) 09/05/2017   HCT 40.1 09/05/2017   MCV 83 09/05/2017   PLT 142 (L) 09/05/2017   No results found for: FERRITIN, IRON, TIBC, UIBC, IRONPCTSAT Lab Results  Component Value Date   RBC 4.81 09/05/2017   No results found for: KPAFRELGTCHN, LAMBDASER, KAPLAMBRATIO No results found for: IGGSERUM, IGA, IGMSERUM No results found for: Ronnald Ramp, A1GS, A2GS, Violet Baldy MSPIKE, SPEI   Chemistry      Component Value Date/Time   NA 145 09/05/2017 1015   NA 141 07/23/2016 1255   K  4.2 09/05/2017 1015   K 4.1 07/23/2016 1255   CL 105 09/05/2017 1015   CO2 29 09/05/2017 1015   CO2 27 07/23/2016 1255   BUN 16 09/05/2017 1015   BUN 13.8 07/23/2016 1255   CREATININE 1.1 09/05/2017 1015   CREATININE 1.0 07/23/2016 1255      Component Value Date/Time   CALCIUM 9.9 09/05/2017 1015   CALCIUM 9.2 07/23/2016 1255   ALKPHOS 66 09/05/2017 1015   ALKPHOS 77 07/23/2016 1255   AST 38 09/05/2017 1015   AST 23 07/23/2016 1255   ALT 31 09/05/2017 1015   ALT 13 07/23/2016 1255   BILITOT 1.00 09/05/2017 1015   BILITOT 1.13 07/23/2016 1255     Impression and Plan: Mr. Canal is A 71 year old white male. He has metastatic low-grade neuroendocrine carcinoma. He has hepatic metastases.  He will get his second cycle of Lutathera tomorrow.  After his Lutathera, radiology will give him his Somatuline.  Hopefully, he will have another CT scan in about a month.  I will set him up with a CT scan when we see him back.  We will plan to see him back in another month for evaluation.     Burney Gauze, MD  11/5/201811:45 AM

## 2017-10-05 ENCOUNTER — Ambulatory Visit (HOSPITAL_BASED_OUTPATIENT_CLINIC_OR_DEPARTMENT_OTHER): Payer: Medicare HMO

## 2017-10-05 ENCOUNTER — Encounter (HOSPITAL_COMMUNITY)
Admission: RE | Admit: 2017-10-05 | Discharge: 2017-10-05 | Disposition: A | Discharge status: Home / Self Care | Payer: Medicare HMO | Source: Ambulatory Visit | Attending: Hematology & Oncology | Admitting: Hematology & Oncology

## 2017-10-05 DIAGNOSIS — C7A8 Other malignant neuroendocrine tumors: Secondary | ICD-10-CM

## 2017-10-05 DIAGNOSIS — C7B8 Other secondary neuroendocrine tumors: Secondary | ICD-10-CM | POA: Diagnosis not present

## 2017-10-05 DIAGNOSIS — E349 Endocrine disorder, unspecified: Secondary | ICD-10-CM | POA: Diagnosis not present

## 2017-10-05 DIAGNOSIS — E340 Carcinoid syndrome: Secondary | ICD-10-CM

## 2017-10-05 DIAGNOSIS — C7A1 Malignant poorly differentiated neuroendocrine tumors: Secondary | ICD-10-CM | POA: Diagnosis not present

## 2017-10-05 LAB — CHROMOGRANIN A: Chromogranin A: 12 nmol/L — ABNORMAL HIGH (ref 0–5)

## 2017-10-05 IMAGING — NM NM [PERSON_NAME] ADMINISTRATION
1 series · 1 of 1 positions shown · non-contrast
Comparison: none

CLINICAL DATA: [age] year-old male with metastatic neuroendocrine
tumor. Well differentiated tumor with somatostatin receptor is
identified within the liver by DOTATATE PET CT scan.

EXAM:
NUCLEAR MEDICINE LUTATHERA ADMINISTRATION
TECHNIQUE: Infusion: The nuclear medicine technologist and I personally
verified the dose activity (209 mCi) to be delivered as specified in
the written directive (200 mCi), and verified the patient
identification via 2 separate methods. 20 gauge IV were started in
the antecubital veins. Anti-emetics were administered by nursing
staff. MOYER acid renal protection was initiated 30 minutes prior to
Lu 177 DOTATATE (Lutathera) infusion and continued continuously for
4 hours. Lutathera infusion was administered over 30 minutes.

[Series 1: static · 2.07mm/px · 1 of 1 slices shown]
[im 1/1]
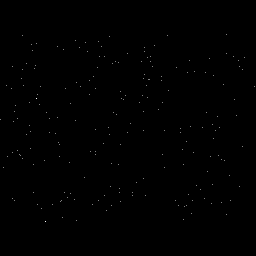

[1 of 1 positions shown; findings below may reference images not displayed]

The total administered dose was 204.62  mCi Lu 177 DOTATATE.

The entire IV tubing, venocatheter, stopcock and syringes was
removed in total, placed in a disposal bag and sent for assay of the
residual activity, which will be reported at a later time in our EMR
by the physics staff. Pressure was applied to the venipuncture site,
and a compression bandage placed. Radiation Safety personnel were
present to perform the discharge survey, as detailed on their
documentation.

Patient will report to the [HOSPITAL] to have a Sandostatin IM
injection between 4 and 24 hours post Lutathera administration.

RADIOPHARMACEUTICALS:  204.62 mCi Lu 177 DOTATATE
FINDINGS: Diagnosis: Metastatic neuroendocrine tumor.

Current Infusion: 2

Planned Infusions: 4

Patient presented to nuclear medicine for treatment. The patient's
most recent blood counts were reviewed and remains a good candidate
to proceed with Lutathera. The patient was situated in an infusion
suite and administered Lutathera as above. Patient will follow-up
with referring oncologist for interval serum laboratories.

Patient experienced flushing down the last infusion therefore close
attention was directed by nursing staff and physician.

Micro-dosimetry: The prescribed radiation activity was assayed and
confirmed to be within specified tolerance.
IMPRESSION: Second Lu 177 DOTATATE treatment for metastatic neuroendocrine
tumor. The patient tolerated the infusion well. No flushing
experienced during infusion today. The patient will return in 8 to
10 weeks for ongoing care.

## 2017-10-05 MED ORDER — ONDANSETRON HCL 40 MG/20ML IJ SOLN
8.0000 mg | Freq: Once | INTRAMUSCULAR | Status: AC
  Administered 2017-10-05: 8 mg via INTRAVENOUS
  Filled 2017-10-05: qty 4

## 2017-10-05 MED ORDER — OCTREOTIDE ACETATE 500 MCG/ML IJ SOLN: 500.0000 ug | Freq: Once | INTRAMUSCULAR | Status: DC | PRN

## 2017-10-05 MED ORDER — AMINO ACID RADIOPROTECTANT - L-LYSINE 2.5%/L-ARGININE 2.5% IN NS
250.0000 mL/h | INTRAVENOUS | Status: AC
  Administered 2017-10-05: 250 mL/h via INTRAVENOUS
  Filled 2017-10-05: qty 1000

## 2017-10-05 MED ORDER — SODIUM CHLORIDE 0.9 % IV SOLN: 500.0000 mL | Freq: Once | INTRAVENOUS | Status: DC

## 2017-10-05 MED ORDER — OCTREOTIDE ACETATE 30 MG IM KIT
30.0000 mg | PACK | Freq: Once | INTRAMUSCULAR | Status: AC
  Administered 2017-10-05: 30 mg via INTRAMUSCULAR
  Filled 2017-10-05: qty 1

## 2017-10-05 MED ORDER — LUTETIUM LU 177 DOTATATE 370 MBQ/ML IV SOLN
200.0000 | Freq: Once | INTRAVENOUS | Status: AC
  Administered 2017-10-05: 200 via INTRAVENOUS

## 2017-10-05 MED ORDER — ONDANSETRON HCL 8 MG PO TABS: 8.0000 mg | ORAL_TABLET | Freq: Two times a day (BID) | ORAL | 0 refills | Status: DC | PRN

## 2017-10-05 MED ORDER — OCTREOTIDE ACETATE 500 MCG/ML IJ SOLN
INTRAMUSCULAR | Status: AC
  Filled 2017-10-05: qty 1

## 2017-10-05 NOTE — Patient Instructions (Signed)

## 2017-10-05 NOTE — Procedures (Signed)
Patient returns for second [place] PRRT therapy 8 weeks following previous therapy. Patient tolerated procedure well with only mild fatigue for several days after the first therapy. Patient's hematological labs, liver function panel and renal function panel are  within normal limits and adequate for full-dose PRRT.   Patient tolerated the amino acid infusion and radiotherapy infusion well with minimal nausea and no vomiting. NO FASCIAL FLUSHING today. Improved symptoms for last therapy.  Patient will return for follow-up laboratory values and assessment at cancer center with Dr. Marin Olp.  Patient will  returned for 3rd PRRT Lutathera therapy in 8 to 10 week.  Pt received IM Sandostatin LAR 4 hours following Lu DOTATATE Infusion  Patient advise to contact radiology with any concerns or questions.

## 2017-10-06 ENCOUNTER — Other Ambulatory Visit (HOSPITAL_COMMUNITY): Payer: Medicare HMO

## 2017-10-13 ENCOUNTER — Telehealth: Payer: Self-pay | Admitting: Cardiovascular Disease

## 2017-10-13 ENCOUNTER — Encounter: Payer: Self-pay | Admitting: Family Medicine

## 2017-10-13 ENCOUNTER — Ambulatory Visit (INDEPENDENT_AMBULATORY_CARE_PROVIDER_SITE_OTHER): Payer: Medicare HMO | Admitting: Family Medicine

## 2017-10-13 VITALS — BP 150/70 | HR 70 | Temp 97.9°F

## 2017-10-13 DIAGNOSIS — J0110 Acute frontal sinusitis, unspecified: Secondary | ICD-10-CM

## 2017-10-13 MED ORDER — AMOXICILLIN 500 MG PO CAPS: 1000.0000 mg | ORAL_CAPSULE | Freq: Two times a day (BID) | ORAL | 0 refills | Status: DC

## 2017-10-13 MED ORDER — CLOPIDOGREL BISULFATE 75 MG PO TABS: 75.0000 mg | ORAL_TABLET | Freq: Every day | ORAL | 3 refills | Status: DC

## 2017-10-13 NOTE — Telephone Encounter (Signed)
New message  Pt verbalized that he is calling for the rn  Pt said he needs a prescription for paxel he said that he was suppose to be switching from berlinta

## 2017-10-13 NOTE — Patient Instructions (Signed)
We are going to treat you for a sinus infection today with amoxicillin Please let me know if you are not improving in the next few day-Sooner if worse.

## 2017-10-13 NOTE — Telephone Encounter (Signed)
Spoke with patient who called about switching from Brilinta to Plavix. At last ov with Dr. Acie Fredrickson on 11/14 patient was advised to call back when he ran out of the supply of Brilinta tablets he had at home. Patient called to request Plavix Rx as he has only a few remaining Brilinta tabs. I verified his pharmacy. I advised him to call back with questions or concerns and he thanked me for my help.

## 2017-10-13 NOTE — Progress Notes (Signed)
[place] [facility] [address], [place], [place] [address] [phone]  Date:  10/13/2017   Name:  Brian Mays   DOB:  October 01, 1946   MRN:  [id number]  PCP:  Darreld Mclean, MD    Chief Complaint: No chief complaint on file.   History of Present Illness:  Brian Mays is a 71 y.o. very pleasant male patient who presents with the following:  Last seen by myself in August of this year for his CPE History of CAD, neuroendocrine tumor  He is seeing Dr. Marin Olp for his metastatic low grade neuroendocrine tumor with hepatic mets.  he seems to be tolerating this ok- his first treatment made him flush a lot, but the 2nd treatment was easier  Today he wonders if me may have a sinus infection He has noted some sinus congstion for about one month,  He will blow out a lot of mucus from his nose- more in the morning but can happen all day He is using his saline spray and neti pot The sx are not in his chest- just in his sinuses He has not noted any fever No ST No rash  He is doing luthethera for his cancer treatment.  He did his 2nd round last week, he is doing well with this   Dr. Cathie Olden is his cardiologist- they are monitoirng his BP  They stopped his losartan and decreased his metoprolol due to orthostatic hypotension symptoms  He is not taking prednisone right now- he uses it just prior ot scan due to mild allergy to CT dye    Patient Active Problem List   Diagnosis Date Noted  . Lumbar radiculopathy 08/16/2017  . CAD (coronary artery disease) 09/21/2016  . Unstable angina ([place]) 02/09/2016  . Vitamin D deficiency 03/11/2015  . Metastatic malignant neuroendocrine tumor to liver ([place]) 02/14/2015  . Hyperglycemia 04/03/2012  . Diarrhea 03/29/2012  . B12 deficiency 04/19/2011  . TOBACCO USE, QUIT 10/13/2009  . Unspecified vitamin D deficiency 06/19/2009  . Neoplasm by body site 12/23/2008  . DIVERTICULOSIS, COLON  11/08/2008  . ABDOMINAL PAIN 10/02/2008  . Hypothyroidism 12/13/2007  . HEMORRHOIDS, NOS 12/13/2007  . COLONIC POLYPS, HX OF 12/13/2007  . Essential hypertension 09/15/2007  . GERD 09/15/2007  . PANCREATITIS, CHRONIC 09/15/2007  . CARDIAC MURMUR 09/15/2007  . SNORING 09/15/2007    Past Medical History:  Diagnosis Date  . Anemia    in past  . Colon polyps   . Diverticulosis   . Gallstones   . GERD (gastroesophageal reflux disease)   . Heart attack ([place])    mild, Spring 2017  . Heart murmur   . Hemorrhoid   . Hiatal hernia   . HTN (hypertension)   . Hypothyroidism   . Metastatic carcinoma ([place]) 2010   Dr Jonette Eva  . Pancreatitis 1998   chronic    Past Surgical History:  Procedure Laterality Date  . APPENDECTOMY  1962  . CARDIAC CATHETERIZATION N/A 02/10/2016   Procedure: Left Heart Cath and Coronary Angiography;  Surgeon: [facility] Prows, MD;  Location: [facility];  Service: Cardiovascular;  Laterality: N/A;  . CARDIAC CATHETERIZATION  02/10/2016   Procedure: Coronary/Graft Atherectomy;  Surgeon: [facility] Prows, MD;  Location: [facility];  Service: Cardiovascular;;  . CARDIAC CATHETERIZATION  02/10/2016   Procedure: Coronary Stent Intervention;  Surgeon: [facility] Prows, MD;  Location: [facility];  Service: Cardiovascular;;  . CATARACT  EXTRACTION W/ INTRAOCULAR LENS  IMPLANT, BILATERAL Bilateral   . IR GENERIC HISTORICAL  12/30/2015   IR RADIOLOGIST EVAL & MGMT 12/30/2015 Aletta Edouard, MD [facility] INTERV RAD  . IR GENERIC HISTORICAL  11/09/2016   IR RADIOLOGIST EVAL & MGMT 11/09/2016 Aletta Edouard, MD [facility] INTERV RAD  . IR RADIOLOGIST EVAL & MGMT  03/01/2017  . IR RADIOLOGIST EVAL & MGMT  06/29/2017  . LAPAROSCOPIC CHOLECYSTECTOMY  1999  . LIVER BIOPSY  2010  . RADIOACTIVE SEED IMPLANT  X 3   "to my liver"  . TUMOR EXCISION  01/2009   Carcinoil Resection   . TUMOR REMOVAL     from small intestine    Social History   Tobacco Use  . Smoking status: Former Smoker     Packs/day: 1.50    Years: 35.00    Pack years: 52.50    Types: Cigarettes    Start date: 09/25/1959    Last attempt to quit: 12/21/1997    Years since quitting: 19.8  . Smokeless tobacco: Never Used  Substance Use Topics  . Alcohol use: Yes    Alcohol/week: 1.8 - 2.4 oz    Types: 3 - 4 Cans of beer per week    Comment: 4 beers weekly   . Drug use: No    Family History  Problem Relation Age of Onset  . Kidney disease Mother   . Hyperlipidemia Mother   . Hypertension Mother   . COPD Father   . Ulcerative colitis Daughter     Allergies  Allergen Reactions  . Iohexol Hives     Code: HIVES, Desc: PER [name] @ [facility]IMARY CARE, PT IS ALLERGIC TO CONTRAST DYE 10/02/08/RM  05/01/10...needs full premeds per our protocol w/ [facility]., Onset Date: [id number]     Medication list has been reviewed and updated.  Current Outpatient Medications on File Prior to Visit  Medication Sig Dispense Refill  . amLODipine (NORVASC) 5 MG tablet Take 5 mg by mouth daily.  3  . aspirin EC 81 MG tablet Take 81 mg by mouth every morning.    [name] atorvastatin (LIPITOR) 20 MG tablet Take 20 mg daily by mouth.    . Cholecalciferol 1000 UNITS tablet Take 1,000 Units by mouth daily.      . clopidogrel (PLAVIX) 75 MG tablet Take 1 tablet (75 mg total) by mouth daily. 90 tablet 3  . diphenhydrAMINE (BENADRYL) 50 MG tablet Take 1 tablet (50 mg total) by mouth once. Take 1 hour prior to scan. 1 tablet 0  . fluticasone (FLONASE) 50 MCG/ACT nasal spray Place 2 sprays into both nostrils daily as needed (sinuses).   11  . hydrocortisone (ANUSOL-HC) 25 MG suppository INSERT 1 SUPPOSITORY RECTALLY TWICE A DAY AS NEEDED FOR HEMORRHOIDS 30 suppository 0  . Lanreotide Acetate (SOMATULINE DEPOT [place]) Inject 120 mcg into the skin every 28 (twenty-eight) days. Receives at Dr Antonieta Pert office    . lansoprazole (PREVACID) 30 MG capsule Take 1 capsule (30 mg total) by mouth daily at 12 noon. 90 capsule 3  . levothyroxine (SYNTHROID,  LEVOTHROID) 50 MCG tablet Take 1 tablet (50 mcg total) by mouth daily. 90 tablet 1  . metoprolol tartrate (LOPRESSOR) 25 MG tablet Take 0.5 tablets (12.5 mg total) 2 (two) times daily by mouth. 90 tablet 3  . nitroGLYCERIN (NITROSTAT) 0.4 MG SL tablet Place 1 tablet (0.4 mg total) under the tongue every 5 (five) minutes x 3 doses as needed for chest pain. 25 tablet 4  .  Omega-3 Fatty Acids (FISH OIL) 1000 MG CAPS Take 1 capsule by mouth daily. Reported on 12/30/2015    . ondansetron (ZOFRAN) 8 MG tablet Take 1 tablet (8 mg total) by mouth 2 (two) times daily as needed for nausea or vomiting. 20 tablet 0  . predniSONE (DELTASONE) 50 MG tablet Take 50mg  at 13 hours, 7 hours, and 1 hour before scan 3 tablet 2  . sodium chloride (OCEAN) 0.65 % SOLN nasal spray Place 1 spray into both nostrils as needed for congestion.     . vitamin B-12 (CYANOCOBALAMIN) 1000 MCG tablet Take 1,000 mcg by mouth daily.     No current facility-administered medications on file prior to visit.     Review of Systems:  As per HPI- otherwise negative. No fever or chills No CP or SOB No rash No ST No earache   Physical Examination: Vitals:   10/13/17 1538  BP: (!) 150/70  Pulse: 70  Temp: 97.9 F (36.6 C)   There were no vitals filed for this visit. There is no height or weight on file to calculate BMI. Ideal Body Weight:    GEN: WDWN, NAD, Non-toxic, A & O x 3, overweight, looks well HEENT: Atraumatic, Normocephalic. Neck supple. No masses, No LAD.  Bilateral TM wnl, oropharynx normal.  PEERL,EOMI.   Nasal cavity is inflamed and contested with discharge present  Ears and Nose: No external deformity. CV: RRR, No M/G/R. No JVD. No thrill. No extra heart sounds. PULM: CTA B, no wheezes, crackles, rhonchi. No retractions. No resp. distress. No accessory muscle use. ABD: S, NT, ND EXTR: No c/c/e NEURO Normal gait.  PSYCH: Normally interactive. Conversant. Not depressed or anxious appearing.  Calm demeanor.     Assessment and Plan: Acute non-recurrent frontal sinusitis - Plan: amoxicillin (AMOXIL) 500 MG capsule  Here today with sinus sx for a month.  Will treat for sinusitis with amoxicillin Asked him to alert me if not feeling better in the next several days- Sooner if worse.    Signed Lamar Blinks, MD

## 2017-10-13 NOTE — Telephone Encounter (Signed)
Left message for patient to call back  

## 2017-10-17 ENCOUNTER — Encounter: Payer: Self-pay | Admitting: Hematology & Oncology

## 2017-10-20 ENCOUNTER — Other Ambulatory Visit: Payer: Self-pay | Admitting: *Deleted

## 2017-10-20 DIAGNOSIS — Z91041 Radiographic dye allergy status: Secondary | ICD-10-CM

## 2017-10-20 MED ORDER — DIPHENHYDRAMINE HCL 50 MG PO TABS
50.0000 mg | ORAL_TABLET | Freq: Once | ORAL | 2 refills | Status: DC
Start: 2017-10-20 — End: 2017-11-07

## 2017-10-20 MED ORDER — PREDNISONE 50 MG PO TABS: ORAL_TABLET | ORAL | 2 refills | Status: DC

## 2017-10-27 ENCOUNTER — Encounter (HOSPITAL_BASED_OUTPATIENT_CLINIC_OR_DEPARTMENT_OTHER): Payer: Self-pay

## 2017-10-27 ENCOUNTER — Ambulatory Visit (HOSPITAL_BASED_OUTPATIENT_CLINIC_OR_DEPARTMENT_OTHER)
Admission: RE | Admit: 2017-10-27 | Discharge: 2017-10-27 | Disposition: A | Discharge status: Home / Self Care | Payer: Medicare HMO | Source: Ambulatory Visit | Attending: Hematology & Oncology | Admitting: Hematology & Oncology

## 2017-10-27 DIAGNOSIS — C7B8 Other secondary neuroendocrine tumors: Secondary | ICD-10-CM | POA: Insufficient documentation

## 2017-10-27 DIAGNOSIS — C787 Secondary malignant neoplasm of liver and intrahepatic bile duct: Secondary | ICD-10-CM | POA: Insufficient documentation

## 2017-10-27 DIAGNOSIS — K4020 Bilateral inguinal hernia, without obstruction or gangrene, not specified as recurrent: Secondary | ICD-10-CM | POA: Insufficient documentation

## 2017-10-27 DIAGNOSIS — I700 Atherosclerosis of aorta: Secondary | ICD-10-CM | POA: Diagnosis not present

## 2017-10-27 DIAGNOSIS — K5730 Diverticulosis of large intestine without perforation or abscess without bleeding: Secondary | ICD-10-CM | POA: Insufficient documentation

## 2017-10-27 DIAGNOSIS — I2510 Atherosclerotic heart disease of native coronary artery without angina pectoris: Secondary | ICD-10-CM | POA: Insufficient documentation

## 2017-10-27 IMAGING — CT CT ABD-PELV W/ CM
2 of 5 series · 15 of 46 positions shown, 17 images · IV contrast (APPLIED)
Comparison: [DATE] CT abdomen/pelvis. [DATE] DOTATATE
PET-CT.

CLINICAL DATA: Metastatic neuroendocrine tumor status post
Lutathera treatments on [DATE] and [DATE]. Restaging.

EXAM:
CT ABDOMEN AND PELVIS WITH CONTRAST
TECHNIQUE: Multidetector CT imaging of the abdomen and pelvis was performed
using the standard protocol following bolus administration of
intravenous contrast.
CONTRAST:  100mL [IX] IOPAMIDOL ([IX]) INJECTION 61%

[Series 2: axial st · axial · 0.94mm/px · z∈[-502,-42]mm · 12 of 104 slices shown, 14 images]
[im 6/104  soft-tissue]
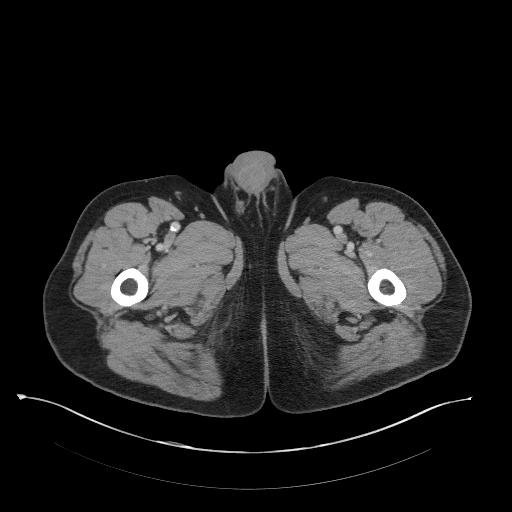
[im 6/104  bone]
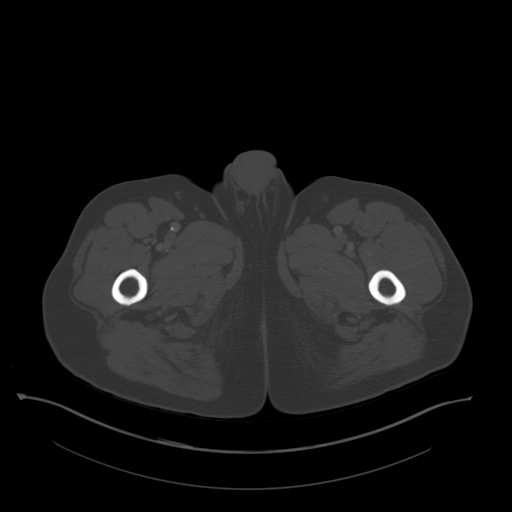
[im 17/104  soft-tissue]
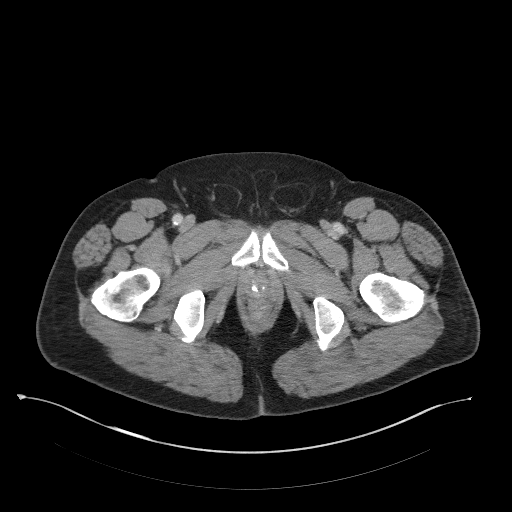
[im 22/104  soft-tissue]
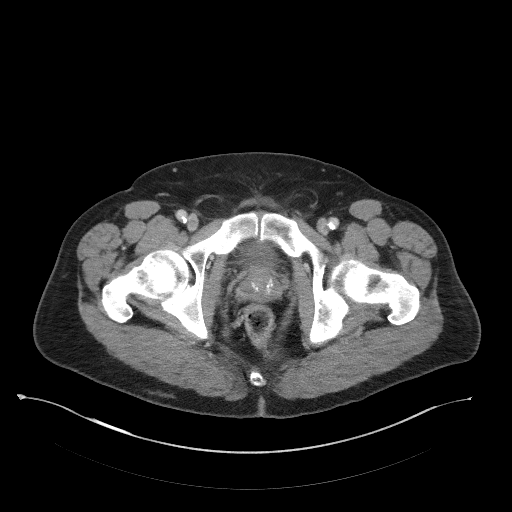
[im 33/104  soft-tissue]
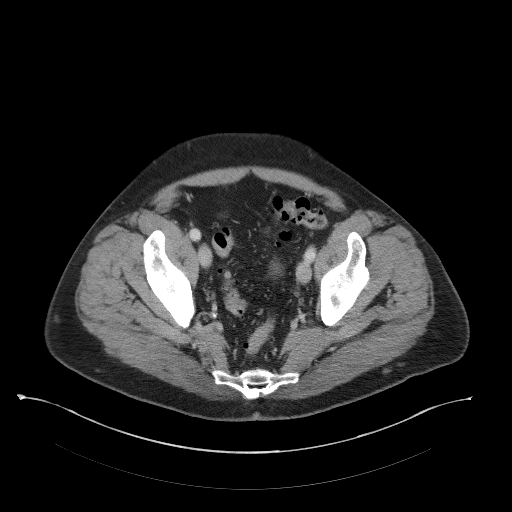
[im 38/104  soft-tissue]
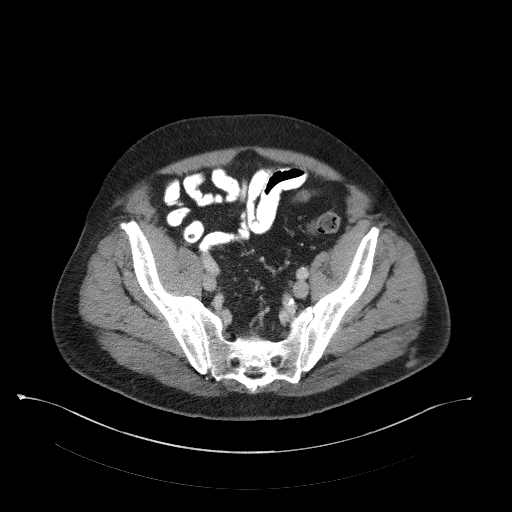
[im 49/104  soft-tissue]
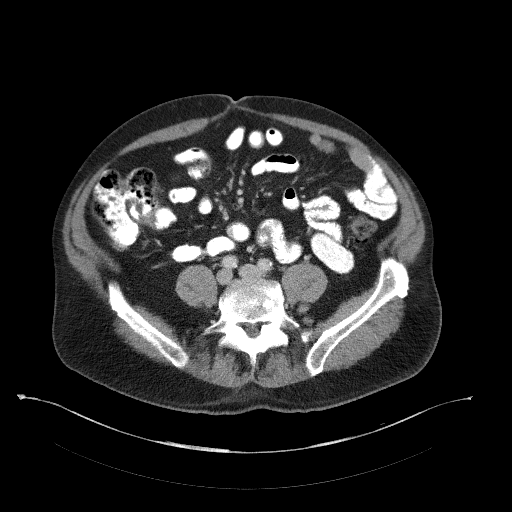
[im 55/104  soft-tissue]
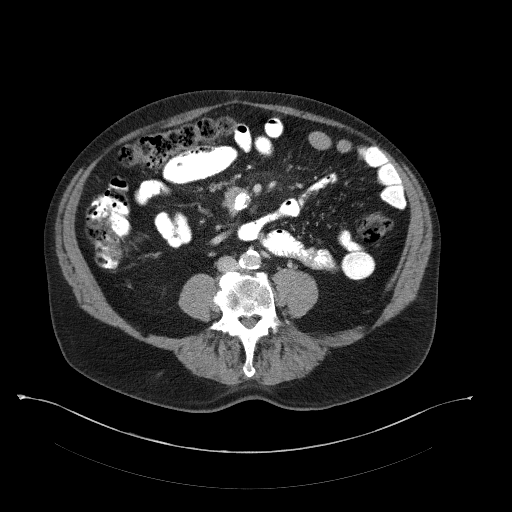
[im 66/104  soft-tissue]
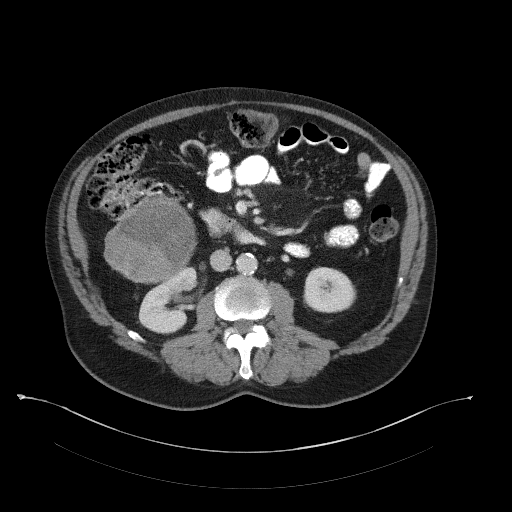
[im 71/104  soft-tissue]
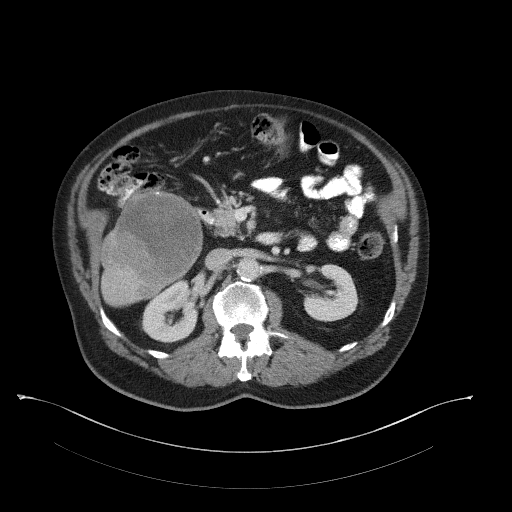
[im 71/104  bone]
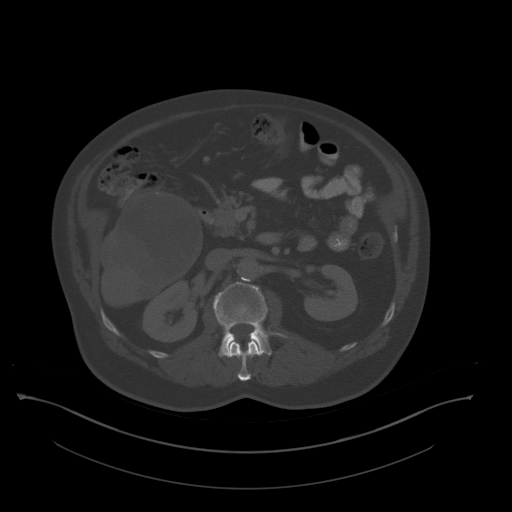
[im 82/104  soft-tissue]
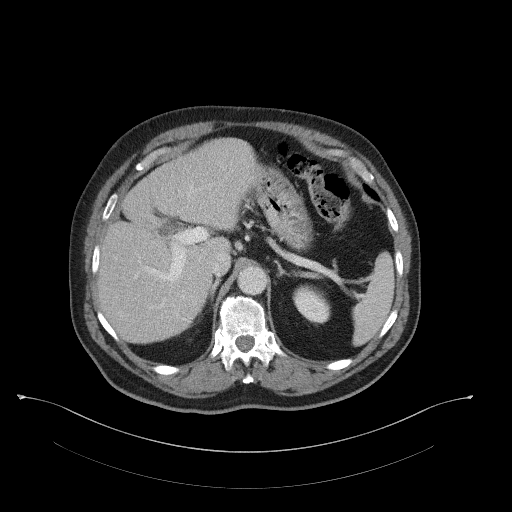
[im 87/104  soft-tissue]
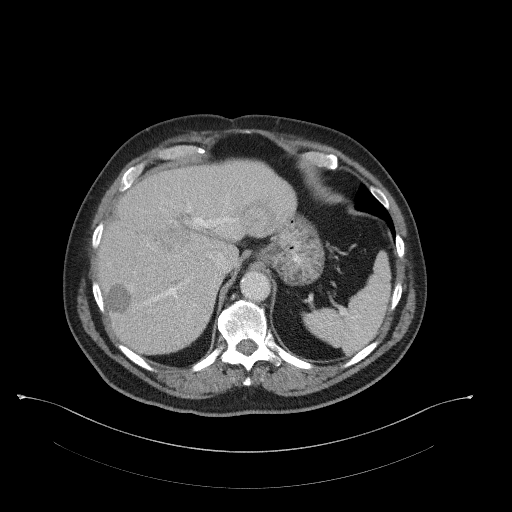
[im 98/104  soft-tissue]
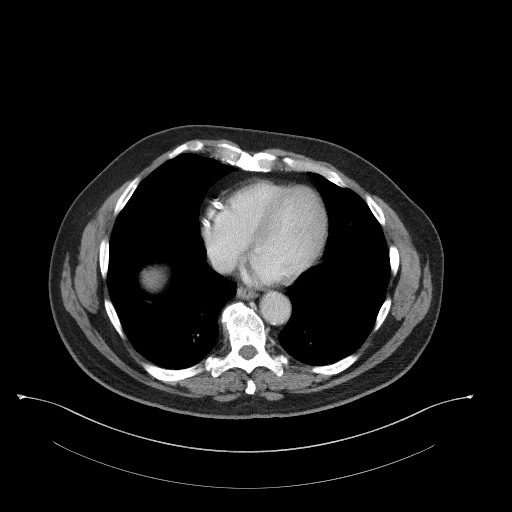

[Series 5: coronal st · coronal · 0.79mm/px · 3 of 108 slices shown]
[im 36/108  soft-tissue]
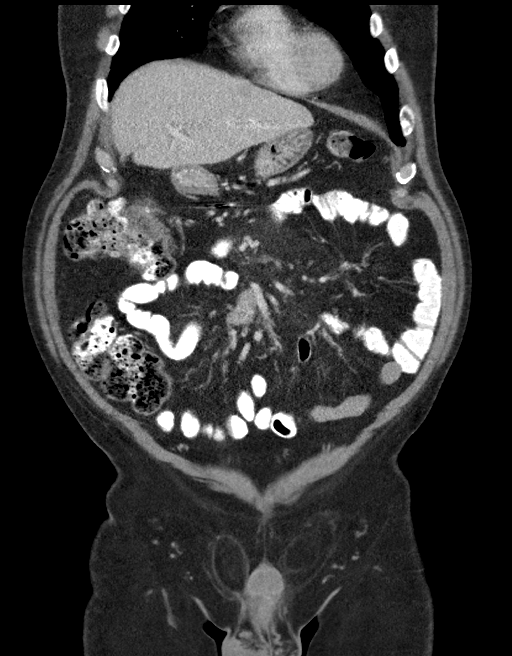
[im 48/108  soft-tissue]
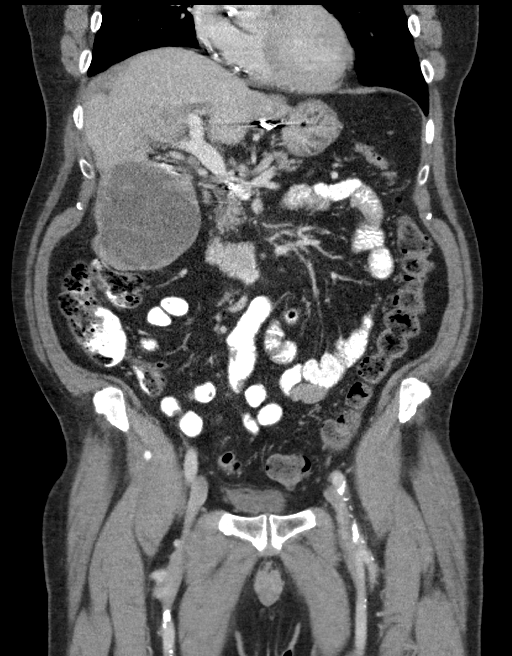
[im 60/108  soft-tissue]
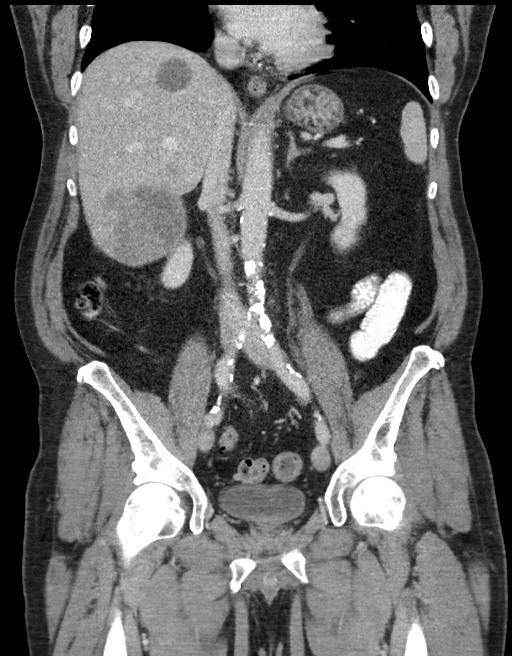

[15 of 46 positions shown; findings below may reference images not displayed]

FINDINGS: Lower chest: No significant pulmonary nodules or acute consolidative
airspace disease. Coronary atherosclerosis.

Hepatobiliary: There are 6 heterogeneously hypoenhancing liver
masses scattered throughout the liver, which are stable to mildly
increased since [DATE] CT abdomen/pelvis study as follows:

- far inferior right liver lobe 10.1 x 8.9 cm mass (series 2/image
35), previously 9.7 x 8.9 cm using similar measurement technique,
minimally increased

- central liver 3.7 x 2.8 cm mass (series 2/image 20), previously
3.6 x 2.9 cm, not appreciably changed

- superior right liver lobe 3.2 x 3.1 cm mass (series 2/image 12),
previously 2.7 x 2.6 cm, mildly increased

- peripheral right liver lobe 3.0 x 2.4 cm mass (series 2/image 18),
previously 3.0 x 2.4 cm, stable

- lateral segment left liver lobe 5.5 x 3.5 cm mass (series 2/image
16), previously 4.9 x 3.2 cm, mildly increased

No new liver masses.  Cholecystectomy. No biliary ductal dilatation.

Pancreas: Normal, with no mass or duct dilation. Surgical clips are
again noted adjacent to the pancreatic neck.

Spleen: Normal size. No mass.

Adrenals/Urinary Tract: Normal adrenals. No hydronephrosis.
Scattered hypodense subcentimeter renal cortical lesions in both
kidneys, too small to characterize, unchanged, compatible with
benign lesions. No new renal lesions. Normal bladder.

Stomach/Bowel: Grossly normal stomach. Normal caliber small bowel
with no small bowel wall thickening. Appendectomy. Mild sigmoid
diverticulosis, with no large bowel wall thickening or pericolonic
fat stranding. Oral contrast transits to the right colon.

Vascular/Lymphatic: Atherosclerotic nonaneurysmal abdominal aorta.
Patent portal, splenic, hepatic and renal veins. Partially calcified
2.9 x 2.6 cm central mesenteric mass (series 2/image 50), previously
2.9 x 2.8 cm using similar measurement technique, not appreciably
changed. Otherwise no pathologically enlarged abdominopelvic nodes.

Reproductive: Normal size prostate with nonspecific stable internal
prostatic calcifications.

Other: No pneumoperitoneum, ascites or focal fluid collection.
Stable small fat containing bilateral inguinal hernias.

Musculoskeletal: No aggressive appearing focal osseous lesions.
Stable non expansile sclerotic lesion in the lateral left seventh
rib, which was non-radiotracer avid on [DATE] PET. Moderate
thoracolumbar spondylosis.
IMPRESSION: 1. Liver metastases are stable to mildly increased in size since
[DATE] CT abdomen/pelvis study.
2. Partially calcified central mesenteric mass is stable.
3. No new sites of metastatic disease in the abdomen or pelvis .
4. Chronic findings include: Aortic Atherosclerosis ([IX]-[IX]).
Coronary atherosclerosis. Mild sigmoid diverticulosis. Stable small
fat containing bilateral inguinal hernias.

## 2017-10-27 MED ORDER — IOPAMIDOL (ISOVUE-300) INJECTION 61%
100.0000 mL | Freq: Once | INTRAVENOUS | Status: AC | PRN
  Administered 2017-10-27: 100 mL via INTRAVENOUS

## 2017-11-07 ENCOUNTER — Other Ambulatory Visit: Payer: Self-pay

## 2017-11-07 ENCOUNTER — Inpatient Hospital Stay: Payer: Medicare HMO

## 2017-11-07 ENCOUNTER — Encounter: Payer: Self-pay | Admitting: Hematology & Oncology

## 2017-11-07 ENCOUNTER — Inpatient Hospital Stay: Payer: Medicare HMO | Attending: Hematology & Oncology | Admitting: Hematology & Oncology

## 2017-11-07 VITALS — BP 135/69 | HR 66 | Temp 98.6°F | Resp 18 | Wt 209.0 lb

## 2017-11-07 DIAGNOSIS — C7A8 Other malignant neuroendocrine tumors: Secondary | ICD-10-CM | POA: Diagnosis not present

## 2017-11-07 DIAGNOSIS — C7B8 Other secondary neuroendocrine tumors: Secondary | ICD-10-CM | POA: Diagnosis not present

## 2017-11-07 LAB — CMP (CANCER CENTER ONLY)
ALK PHOS: 61 U/L (ref 40–150)
ALT: 16 U/L (ref 0–55)
AST: 26 U/L (ref 5–34)
Albumin: 3.4 g/dL — ABNORMAL LOW (ref 3.5–5.0)
Anion gap: 7 (ref 3–11)
BILIRUBIN TOTAL: 0.6 mg/dL (ref 0.2–1.2)
BUN: 12 mg/dL (ref 7–26)
CALCIUM: 9.1 mg/dL (ref 8.4–10.4)
CHLORIDE: 105 mmol/L (ref 98–109)
CO2: 28 mmol/L (ref 22–29)
CREATININE: 0.94 mg/dL (ref 0.70–1.30)
GFR, Est AFR Am: >60 mL/min (ref >60)
GFR, Estimated: >60 mL/min (ref >60)
Glucose, Bld: 121 mg/dL (ref 70–140)
Potassium: 4.1 mmol/L (ref 3.5–5.1)
Sodium: 140 mmol/L (ref 136–145)
Total Protein: 7.2 g/dL (ref 6.4–8.3)

## 2017-11-07 LAB — CBC WITH DIFFERENTIAL (CANCER CENTER ONLY)
ABS GRANULOCYTE: 5.2 10*3/uL (ref 1.5–6.5)
BASOS ABS: 0 10*3/uL (ref 0.0–0.1)
Basophils Relative: 0 %
Eosinophils Absolute: 0.4 10*3/uL (ref 0.0–0.5)
Eosinophils Relative: 5 %
HEMATOCRIT: 35.3 % — AB (ref 38.7–49.9)
HEMOGLOBIN: 11.6 g/dL — AB (ref 13.0–17.1)
LYMPHS ABS: 0.8 10*3/uL — AB (ref 0.9–3.3)
LYMPHS PCT: 12 %
MCH: 27.2 pg — AB (ref 28.0–33.4)
MCHC: 32.9 g/dL (ref 32.0–35.9)
MCV: 82.7 fL (ref 82.0–98.0)
Monocytes Absolute: 0.5 10*3/uL (ref 0.1–0.9)
Monocytes Relative: 7 %
NEUTROS ABS: 5.2 10*3/uL (ref 1.5–6.5)
Neutrophils Relative %: 76 %
PLATELETS: 127 10*3/uL — AB (ref 140–400)
RBC: 4.27 MIL/uL (ref 4.20–5.70)
RDW: 16.3 % — ABNORMAL HIGH (ref 11.1–15.7)
WBC: 6.9 10*3/uL (ref 4.0–10.3)

## 2017-11-07 LAB — LACTATE DEHYDROGENASE: LDH: 212 U/L (ref 125–245)

## 2017-11-07 MED ORDER — LANREOTIDE ACETATE 120 MG/0.5ML ~~LOC~~ SOLN
120.0000 mg | Freq: Once | SUBCUTANEOUS | Status: AC
  Administered 2017-11-07: 120 mg via SUBCUTANEOUS

## 2017-11-07 NOTE — Patient Instructions (Signed)
Lanreotide injection What is this medicine? LANREOTIDE (lan REE oh tide) is used to reduce blood levels of growth hormone in patients with a condition called acromegaly. It also works to slow or stop tumor growth in patients with neuroendocrine tumors and treat carcinoid syndrome. This medicine may be used for other purposes; ask your health care provider or pharmacist if you have questions. COMMON BRAND NAME(S): Somatuline [place] What should I tell my health care provider before I take this medicine? They need to know if you have any of these conditions: -diabetes -gallbladder disease -heart disease -kidney disease -liver disease -thyroid disease -an unusual or allergic reaction to lanreotide, other medicines, foods, dyes, or preservatives -pregnant or trying to get pregnant -breast-feeding How should I use this medicine? This medicine is for injection under the skin. It is given by a health care professional in a hospital or clinic setting. Contact your pediatrician or health care professional regarding the use of this medicine in children. Special care may be needed. Overdosage: If you think you have taken too much of this medicine contact a poison control center or emergency room at once. NOTE: This medicine is only for you. Do not share this medicine with others. What if I miss a dose? It is important not to miss your dose. Call your doctor or health care professional if you are unable to keep an appointment. What may interact with this medicine? This medicine may interact with the following medications: -bromocriptine -cyclosporine -certain medicines for blood pressure, heart disease, irregular heart beat -certain medicines for diabetes -quinidine -terfenadine This list may not describe all possible interactions. Give your health care provider a list of all the medicines, herbs, non-prescription drugs, or dietary supplements you use. Also tell them if you smoke, drink alcohol, or  use illegal drugs. Some items may interact with your medicine. What should I watch for while using this medicine? Tell your doctor or healthcare professional if your symptoms do not start to get better or if they get worse. Visit your doctor or health care professional for regular checks on your progress. Your condition will be monitored carefully while you are receiving this medicine. You may need blood work done while you are taking this medicine. Women should inform their doctor if they wish to become pregnant or think they might be pregnant. There is a potential for serious side effects to an unborn child. Talk to your health care professional or pharmacist for more information. Do not breast-feed an infant while taking this medicine or for 6 months after stopping it. This medicine has caused ovarian failure in some women. This medicine may interfere with the ability to have a child. Talk with your doctor or health care professional if you are concerned about your fertility. What side effects may I notice from receiving this medicine? Side effects that you should report to your doctor or health care professional as soon as possible: -allergic reactions like skin rash, itching or hives, swelling of the face, lips, or tongue -increased blood pressure -severe stomach pain -signs and symptoms of high blood sugar such as dizziness; dry mouth; dry skin; fruity breath; nausea; stomach pain; increased hunger or thirst; increased urination -signs and symptoms of low blood sugar such as feeling anxious; confusion; dizziness; increased hunger; unusually weak or tired; sweating; shakiness; cold; irritable; headache; blurred vision; fast heartbeat; loss of consciousness -unusually slow heartbeat Side effects that usually do not require medical attention (report to your doctor or health care professional if they continue   or are bothersome): -constipation -diarrhea -dizziness -headache -muscle pain -muscle  spasms -nausea -pain, redness, or irritation at site where injected This list may not describe all possible side effects. Call your doctor for medical advice about side effects. You may report side effects to FDA at 1-800-FDA-1088. Where should I keep my medicine? This drug is given in a hospital or clinic and will not be stored at home. NOTE: This sheet is a summary. It may not cover all possible information. If you have questions about this medicine, talk to your doctor, pharmacist, or health care provider.  2018 Elsevier/Gold Standard (2016-07-23 10:33:47)  

## 2017-11-07 NOTE — Progress Notes (Signed)
Hematology and Oncology Follow Up Visit  Brian Mays [id number] 07-31-1946 72 y.o. 09/05/2017   Principle Diagnosis:  Metastatic low grade neuroendocrine tumor-hepatic metastases  Current Therapy:   S/p third yttrium-90 intrahepatic therapy - November 2016 Somatuline 120 mg monthly Lutathera (Lu 177) injection on 10/05/2017 - s/p cycle #2    Interim History: Brian Mays is back for follow-up.  He looks quite good.  He feels pretty good.  He has had no problems with diarrhea.  There is been no abdominal pain.  He had no problems over the holiday season.  He has been eating well.  He has been trying to stay active.  He has had 2 treatments of the [place].  He is done well.  His next treatment is in a month.  He did have a CAT scan done last week.  This showed slight/minimal progression of a couple liver lesions.  I am not sure what this really is signifies.  I am not sure this really indicate progressive disease.  At his last chromogranin A level a month ago was 12.   He has had no headache.  He has had no chest pain.  He has had no shortness of breath.  Currently, his performance status is ECOG 1.  Medications:  Allergies as of 09/05/2017      Reactions   Iohexol Hives    Code: HIVES, Desc: PER [name] @ [facility]IMARY [facility], PT IS ALLERGIC TO CONTRAST DYE 10/02/08/RM  05/01/10...needs full premeds per our protocol w/ [facility]., Onset Date: [id number]      Medication List        Accurate as of 09/05/17 11:45 AM. Always use your most recent med list.          amLODipine 5 MG tablet Commonly known as:  NORVASC Take 5 mg by mouth daily.   aspirin EC 81 MG tablet Take 81 mg by mouth every morning.   atorvastatin 20 MG tablet Commonly known as:  LIPITOR Take 20 mg daily by mouth.   Cholecalciferol 1000 units tablet Take 1,000 Units by mouth daily.   diphenhydrAMINE 50 MG tablet Commonly known as:  BENADRYL Take 1 tablet (50 mg total) by mouth once. Take 1 hour prior to scan.   Fish Oil 1000 MG Caps Take 1 capsule by mouth daily. Reported on 12/30/2015   fluticasone 50 MCG/ACT nasal spray Commonly known as:  FLONASE Place 2 sprays into both nostrils daily as needed (sinuses).   hydrocortisone 25 MG suppository Commonly known as:  ANUSOL-HC INSERT 1 SUPPOSITORY RECTALLY TWICE A DAY AS NEEDED FOR HEMORRHOIDS   lansoprazole 30 MG capsule Commonly known as:  PREVACID Take 1 capsule (30 mg total) by mouth daily at 12 noon.   levothyroxine 50 MCG tablet Commonly known as:  SYNTHROID, LEVOTHROID Take 1 tablet (50 mcg total) by mouth daily.   losartan 100 MG tablet Commonly known as:  COZAAR TAKE 1 TABLET (100 MG TOTAL) BY MOUTH AT BEDTIME.   metoprolol tartrate 25 MG tablet Commonly known as:  LOPRESSOR Take 1 tablet (25 mg total) by mouth 2 (two) times daily.   naproxen 500 MG tablet Commonly known as:  NAPROSYN Take 1 tablet (500 mg total) by mouth 2 (two) times daily with a meal.   nitroGLYCERIN 0.4 MG SL tablet Commonly known as:  NITROSTAT Place 1 tablet (0.4 mg total) under the tongue every 5 (five) minutes x 3 doses as needed for chest pain.   predniSONE 50 MG tablet Commonly known  as:  DELTASONE Take 50mg  at 13 hours, 7 hours, and 1 hour before scan   sodium chloride 0.65 % Soln nasal spray Commonly known as:  OCEAN Place 1 spray into both nostrils as needed for congestion.   SOMATULINE DEPOT [place] Inject 120 mcg into the skin every 28 (twenty-eight) days. Receives at Dr Antonieta Pert office   ticagrelor 90 MG Tabs tablet Commonly known as:  BRILINTA Take 1 tablet (90 mg total) by mouth 2 (two) times daily.   tiZANidine 4 MG tablet Commonly known as:  ZANAFLEX Take 1 tablet (4 mg total) by mouth every 6 (six) hours as needed for muscle spasms.   vitamin B-12 1000 MCG tablet Commonly known as:  CYANOCOBALAMIN Take 1,000 mcg by mouth daily.       Allergies:  Allergies  Allergen Reactions  . Iohexol Hives     Code: HIVES, Desc: PER  [name] @ [facility]IMARY [facility]ARE, PT IS ALLERGIC TO CONTRAST DYE 10/02/08/RM  05/01/10...needs full premeds per our protocol w/ [facility]., Onset Date: [id number]     Past Medical History, Surgical history, Social history, and Family History were reviewed and updated.  Review of Systems: Review of Systems  Constitutional: Negative for appetite change, fatigue, fever and unexpected weight change.  HENT:   Negative for lump/mass, mouth sores, sore throat and trouble swallowing.   Respiratory: Negative for cough, hemoptysis and shortness of breath.   Cardiovascular: Negative for leg swelling and palpitations.  Gastrointestinal: Negative for abdominal distention, abdominal pain, blood in stool, constipation, diarrhea, nausea and vomiting.  Genitourinary: Negative for bladder incontinence, dysuria, frequency and hematuria.   Musculoskeletal: Negative for arthralgias, back pain, gait problem and myalgias.  Skin: Negative for itching and rash.  Neurological: Negative for dizziness, extremity weakness, gait problem, headaches, numbness, seizures and speech difficulty.  Hematological: Does not bruise/bleed easily.  Psychiatric/Behavioral: Negative for depression and sleep disturbance. The patient is not nervous/anxious.      Physical Exam:  weight is 203 lb 4 oz (92.2 kg). His oral temperature is 98.3 F (36.8 C). His blood pressure is 113/63 and his pulse is 70. His respiration is 18 and oxygen saturation is 98%.   Wt Readings from Last 3 Encounters:  09/05/17 203 lb 4 oz (92.2 kg)  08/25/17 205 lb (93 kg)  07/25/17 205 lb (93 kg)    Physical Exam  Constitutional: He is oriented to person, place, and time.  HENT:  Head: Normocephalic and atraumatic.  Mouth/Throat: Oropharynx is clear and moist.  Eyes: EOM are normal. Pupils are equal, round, and reactive to light.  Neck: Normal range of motion.  Cardiovascular: Normal rate, regular rhythm and normal heart sounds.  Pulmonary/Chest: Effort normal and  breath sounds normal.  Abdominal: Soft. Bowel sounds are normal.  Musculoskeletal: Normal range of motion. He exhibits no edema, tenderness or deformity.  Lymphadenopathy:    He has no cervical adenopathy.  Neurological: He is alert and oriented to person, place, and time.  Skin: Skin is warm and dry. No rash noted. No erythema.  Psychiatric: He has a normal mood and affect. His behavior is normal. Judgment and thought content normal.  Vitals reviewed. .   Lab Results  Component Value Date   WBC 6.4 09/05/2017   HGB 12.9 (L) 09/05/2017   HCT 40.1 09/05/2017   MCV 83 09/05/2017   PLT 142 (L) 09/05/2017   No results found for: FERRITIN, IRON, TIBC, UIBC, IRONPCTSAT Lab Results  Component Value Date   RBC 4.81 09/05/2017  No results found for: KPAFRELGTCHN, LAMBDASER, KAPLAMBRATIO No results found for: IGGSERUM, IGA, IGMSERUM No results found for: Odetta Pink, SPEI   Chemistry      Component Value Date/Time   NA 145 09/05/2017 1015   NA 141 07/23/2016 1255   K 4.2 09/05/2017 1015   K 4.1 07/23/2016 1255   CL 105 09/05/2017 1015   CO2 29 09/05/2017 1015   CO2 27 07/23/2016 1255   BUN 16 09/05/2017 1015   BUN 13.8 07/23/2016 1255   CREATININE 1.1 09/05/2017 1015   CREATININE 1.0 07/23/2016 1255      Component Value Date/Time   CALCIUM 9.9 09/05/2017 1015   CALCIUM 9.2 07/23/2016 1255   ALKPHOS 66 09/05/2017 1015   ALKPHOS 77 07/23/2016 1255   AST 38 09/05/2017 1015   AST 23 07/23/2016 1255   ALT 31 09/05/2017 1015   ALT 13 07/23/2016 1255   BILITOT 1.00 09/05/2017 1015   BILITOT 1.13 07/23/2016 1255     Impression and Plan: Brian Mays is a 71 year old white male. He has metastatic low-grade neuroendocrine carcinoma. He has hepatic metastases.  I do believe that the Lutathera is going to help.  He still is too early in his treatment cycle to do another PET scan that is specific for neuroendocrine cancers.  His  third dose of Lutathera will be in early February.  We will go ahead and give him Somatuline today.  I will see him back in another 6 weeks.  I want to make sure we see him back after he gets his third Lutathera injection.   Burney Gauze, MD  11/5/201811:45 AM

## 2017-11-10 LAB — CHROMOGRANIN A: Chromogranin A: 12 nmol/L — ABNORMAL HIGH (ref 0–5)

## 2017-11-23 ENCOUNTER — Other Ambulatory Visit: Payer: Self-pay | Admitting: Hematology & Oncology

## 2017-11-23 DIAGNOSIS — C7A8 Other malignant neuroendocrine tumors: Secondary | ICD-10-CM

## 2017-11-29 ENCOUNTER — Other Ambulatory Visit: Payer: Medicare HMO | Admitting: *Deleted

## 2017-11-29 DIAGNOSIS — I951 Orthostatic hypotension: Secondary | ICD-10-CM | POA: Diagnosis not present

## 2017-11-29 DIAGNOSIS — I10 Essential (primary) hypertension: Secondary | ICD-10-CM | POA: Diagnosis not present

## 2017-11-29 DIAGNOSIS — I2510 Atherosclerotic heart disease of native coronary artery without angina pectoris: Secondary | ICD-10-CM

## 2017-11-29 LAB — BASIC METABOLIC PANEL
BUN/Creatinine Ratio: 11 (ref 10–24)
BUN: 11 mg/dL (ref 8–27)
CALCIUM: 8.9 mg/dL (ref 8.6–10.2)
CHLORIDE: 101 mmol/L (ref 96–106)
CO2: 25 mmol/L (ref 20–29)
Creatinine, Ser: 0.98 mg/dL (ref 0.76–1.27)
GFR calc non Af Amer: 78 mL/min/{1.73_m2} (ref >59)
GFR, EST AFRICAN AMERICAN: 90 mL/min/{1.73_m2} (ref >59)
Glucose: 110 mg/dL — ABNORMAL HIGH (ref 65–99)
POTASSIUM: 4.2 mmol/L (ref 3.5–5.2)
SODIUM: 140 mmol/L (ref 134–144)

## 2017-11-29 LAB — LIPID PANEL
Chol/HDL Ratio: 2.6 ratio (ref 0.0–5.0)
Cholesterol, Total: 103 mg/dL (ref 100–199)
HDL: 39 mg/dL — AB (ref >39)
LDL Calculated: 45 mg/dL (ref 0–99)
TRIGLYCERIDES: 97 mg/dL (ref 0–149)
VLDL CHOLESTEROL CAL: 19 mg/dL (ref 5–40)

## 2017-11-29 LAB — HEPATIC FUNCTION PANEL
ALBUMIN: 3.9 g/dL (ref 3.5–4.8)
ALT: 17 IU/L (ref 0–44)
AST: 29 IU/L (ref 0–40)
Alkaline Phosphatase: 65 IU/L (ref 39–117)
BILIRUBIN TOTAL: 0.5 mg/dL (ref 0.0–1.2)
Bilirubin, Direct: 0.17 mg/dL (ref 0.00–0.40)
Total Protein: 6.7 g/dL (ref 6.0–8.5)

## 2017-11-30 ENCOUNTER — Ambulatory Visit (HOSPITAL_COMMUNITY)
Admission: RE | Admit: 2017-11-30 | Discharge: 2017-11-30 | Disposition: A | Discharge status: Home / Self Care | Payer: Medicare HMO | Source: Ambulatory Visit | Attending: Hematology & Oncology | Admitting: Hematology & Oncology

## 2017-11-30 ENCOUNTER — Inpatient Hospital Stay: Payer: Medicare HMO

## 2017-11-30 DIAGNOSIS — C7B8 Other secondary neuroendocrine tumors: Secondary | ICD-10-CM | POA: Diagnosis not present

## 2017-11-30 DIAGNOSIS — C7A8 Other malignant neuroendocrine tumors: Secondary | ICD-10-CM | POA: Diagnosis present

## 2017-11-30 DIAGNOSIS — C7A1 Malignant poorly differentiated neuroendocrine tumors: Secondary | ICD-10-CM | POA: Diagnosis not present

## 2017-11-30 IMAGING — NM NM [PERSON_NAME] ADMINISTRATION
1 series · 1 of 1 positions shown · non-contrast
Comparison: none

CLINICAL DATA: [age] year-old male with metastatic neuroendocrine
tumor. Well differentiated tumor with somatostatin receptor is
identified within the liver and mesentery by DOTATATE PET CT scan.

EXAM:
NUCLEAR MEDICINE LUTATHERA ADMINISTRATION
TECHNIQUE: Infusion: The nuclear medicine technologist and I personally
verified the dose activity (206 mCi) to be delivered as specified in
the written directive (200 mCi), and verified the patient
identification via 2 separate methods. 20 gauge IV were started in
the antecubital veins. Anti-emetics were administered by nursing
staff. ROZIER acid renal protection was initiated 30 minutes prior to
Lu 177 DOTATATE (Lutathera) infusion and continued continuously for
4 hours. Lutathera infusion was administered over 30 minutes.

[Series 1: static · 2.07mm/px · 1 of 1 slices shown]
[im 1/1]
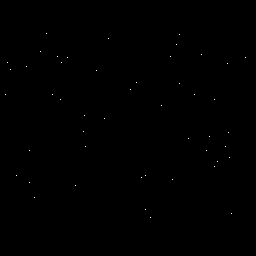

[1 of 1 positions shown; findings below may reference images not displayed]

The total administered dose was 204.5 mCi Lu 177 DOTATATE.

The entire IV tubing, venocatheter, stopcock and syringes was
removed in total, placed in a disposal bag and sent for assay of the
residual activity, which will be reported at a later time in our EMR
by the physics staff. Pressure was applied to the venipuncture site,
and a compression bandage placed. Radiation Safety personnel were
present to perform the discharge survey, as detailed on their
documentation.

RADIOPHARMACEUTICALS:  mCi Lu 177 DOTATATE
FINDINGS: Diagnosis: Metastatic neuroendocrine tumor.

Current Infusion: Third

Planned Infusions: 4

Patient presented to nuclear medicine for treatment. The patient's
most recent blood counts were reviewed and remains a good candidate
to proceed with Lutathera. The patient was situated in an infusion
suite and administered Lutathera as above. Patient did not
experience flushing as in the past. Patient will follow-up with
referring oncologist for interval serum laboratories.

Patient will report to the [HOSPITAL] to have a Sandostatin IM
injection between 4 and 24 hours post Lutathera administration.
IMPRESSION: Third Lu 177 DOTATATE treatment for metastatic neuroendocrine tumor.
The patient tolerated the infusion well without flushing or
diarrhea. The patient will return in 8 to 10 weeks for ongoing care.

## 2017-11-30 MED ORDER — LUTETIUM LU 177 DOTATATE 370 MBQ/ML IV SOLN
200.0000 | Freq: Once | INTRAVENOUS | Status: AC
  Administered 2017-11-30: 200 via INTRAVENOUS

## 2017-11-30 MED ORDER — OCTREOTIDE ACETATE 30 MG IM KIT: 30.0000 mg | PACK | Freq: Once | INTRAMUSCULAR | Status: DC

## 2017-11-30 MED ORDER — LANREOTIDE ACETATE 120 MG/0.5ML ~~LOC~~ SOLN
120.0000 mg | Freq: Once | SUBCUTANEOUS | Status: AC
  Administered 2017-11-30: 120 mg via SUBCUTANEOUS
  Filled 2017-11-30: qty 120

## 2017-11-30 MED ORDER — OCTREOTIDE ACETATE 500 MCG/ML IJ SOLN: 500.0000 ug | Freq: Once | INTRAMUSCULAR | Status: DC | PRN

## 2017-11-30 MED ORDER — OCTREOTIDE ACETATE 30 MG IM KIT
PACK | INTRAMUSCULAR | Status: AC
  Filled 2017-11-30: qty 1

## 2017-11-30 MED ORDER — SODIUM CHLORIDE 0.9 % IV SOLN: 500.0000 mL | Freq: Once | INTRAVENOUS | Status: DC

## 2017-11-30 MED ORDER — SODIUM CHLORIDE 0.9 % IV SOLN
8.0000 mg | Freq: Once | INTRAVENOUS | Status: AC
  Administered 2017-11-30: 8 mg via INTRAVENOUS
  Filled 2017-11-30: qty 4

## 2017-11-30 MED ORDER — OCTREOTIDE ACETATE 500 MCG/ML IJ SOLN
INTRAMUSCULAR | Status: AC
  Filled 2017-11-30: qty 1

## 2017-11-30 MED ORDER — AMINO ACID RADIOPROTECTANT - L-LYSINE 2.5%/L-ARGININE 2.5% IN NS
250.0000 mL/h | INTRAVENOUS | Status: AC
  Administered 2017-11-30: 250 mL/h via INTRAVENOUS
  Filled 2017-11-30: qty 1000

## 2017-11-30 NOTE — Patient Instructions (Signed)

## 2017-11-30 NOTE — Progress Notes (Signed)
Patient received THIRD of four Lutathera Peptide receptor radiotherapy - PRRT. Patient tolerated the procedure well with no nausea or vomiting related to the amino acid infusion.  Patient did not experience flushing or diarrhea.  During the first therapy patient did experience flushing.    Patient had a interval CT scan which showed some lesions larger smaller.  Fever positive tumor response to receptor based radiation therapy over disease progression progression.  Chromogranin A has decreased during therapy from 21 to 12.  Patient received post therapy Sandostatin IM injection same day at the outpatient injection center. Patient will return for follow-up  and laboratory assessment (CBC and CMP) with Dr. Marin Olp in 4 weeks. Patient may receive interval Sandostatin 4 weeks.  Patient will return in the 8 to 10 weeks to [facility]. for final PRRT.   Plan on scheduling DOTATATE PET approximately 2-4 weeks following final  therapy.  See radiology report for full therapy details.Brian Mays

## 2017-12-01 ENCOUNTER — Other Ambulatory Visit (HOSPITAL_COMMUNITY): Payer: Medicare HMO

## 2017-12-06 ENCOUNTER — Encounter: Payer: Self-pay | Admitting: Cardiovascular Disease

## 2017-12-06 ENCOUNTER — Other Ambulatory Visit: Payer: Medicare HMO

## 2017-12-13 ENCOUNTER — Ambulatory Visit: Payer: Medicare HMO | Admitting: Cardiovascular Disease

## 2017-12-13 ENCOUNTER — Other Ambulatory Visit: Payer: Medicare HMO

## 2017-12-13 ENCOUNTER — Encounter: Payer: Self-pay | Admitting: Cardiovascular Disease

## 2017-12-13 ENCOUNTER — Ambulatory Visit: Payer: Medicare HMO | Admitting: Hematology & Oncology

## 2017-12-13 VITALS — BP 120/56 | HR 69 | Ht 70.0 in | Wt 209.2 lb

## 2017-12-13 DIAGNOSIS — I2510 Atherosclerotic heart disease of native coronary artery without angina pectoris: Secondary | ICD-10-CM

## 2017-12-13 DIAGNOSIS — I10 Essential (primary) hypertension: Secondary | ICD-10-CM

## 2017-12-13 NOTE — Patient Instructions (Signed)

## 2017-12-13 NOTE — Progress Notes (Signed)
Cardiology Consultation:   Patient ID: Brian Mays; [id number]; 09-23-1946   Admit date: (Not on file) Date of Consult: 12/13/2017  Primary Care Provider: Darreld Mclean, MD Primary Cardiologist:  Benyamin Jeff  Primary Electrophysiologist:     Patient Profile:   Brian Mays is a 72 y.o. male with a hx of HTN, carcinoid , , coronary artery disease with a recent episode of weakness and near syncope.  who is being seen today for the evaluation of HTN  at the request of Dr. Marin Olp. He sees Dr. Marin Olp for Carcinoid   History of Present Illness:   [name] is seen wife wife , Brian Mays today   Hx of coronary stenting in March 2017 .   Was started on Metoprolol, he has had intermittent weakness and dizziness/lightheadedness since that time.  He eats and drinks regularly.   Generally gets some regular exercise.  Has had some back issues recently.  He still goes to the line does some exercises that do not cause further back pain.  Dr. Marin Olp asked him to reduce his BP meds.   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  [facility] . Does have some lightheadedness after working out .  No CP or dyspnea while working out.   No synmptoms similar to his MI.    He stopped the Losartan last week He decreased the metoprolol to 12. 5 mg BID last week.   Still eats salty foods. Wife cooks from [employer] .  Eats some pretzels.   Has flushing related to Carcinoid.    Typically occurs if he drinks some beer .   Feb.  12, 2019: Doing well Gaining some weight .   Gave him the OK to walk  No CP or dyspnea    Past Medical History:  Diagnosis Date  . Anemia    in past  . Colon polyps   . Diverticulosis   . Gallstones   . GERD (gastroesophageal reflux disease)   . Heart attack ([place])    mild, Spring 2017  . Heart murmur   . Hemorrhoid   . Hiatal hernia   . HTN (hypertension)   .  Hypothyroidism   . Metastatic carcinoma ([place]) 2010   Dr Jonette Eva  . Pancreatitis 1998   chronic    Past Surgical History:  Procedure Laterality Date  . APPENDECTOMY  1962  . CARDIAC CATHETERIZATION N/A 02/10/2016   Procedure: Left Heart Cath and Coronary Angiography;  Surgeon: Adrian Prows, MD;  Location: [facility];  Service: Cardiovascular;  Laterality: N/A;  . CARDIAC CATHETERIZATION  02/10/2016   Procedure: Coronary/Graft Atherectomy;  Surgeon: Adrian Prows, MD;  Location: [facility];  Service: Cardiovascular;;  . CARDIAC CATHETERIZATION  02/10/2016   Procedure: Coronary Stent Intervention;  Surgeon: Adrian Prows, MD;  Location: [facility];  Service: Cardiovascular;;  . CATARACT EXTRACTION W/ INTRAOCULAR LENS  IMPLANT, BILATERAL Bilateral   . IR GENERIC HISTORICAL  12/30/2015   IR RADIOLOGIST EVAL & MGMT 12/30/2015 Aletta Edouard, MD [facility] INTERV RAD  . IR GENERIC HISTORICAL  11/09/2016   IR RADIOLOGIST EVAL & MGMT 11/09/2016 Aletta Edouard, MD [facility] INTERV RAD  . IR RADIOLOGIST EVAL & MGMT  03/01/2017  . IR RADIOLOGIST EVAL & MGMT  06/29/2017  . LAPAROSCOPIC CHOLECYSTECTOMY  1999  . LIVER BIOPSY  2010  .  RADIOACTIVE SEED IMPLANT  X 3   "to my liver"  . TUMOR EXCISION  01/2009   Carcinoil Resection   . TUMOR REMOVAL     from small intestine     Home Medications:  Prior to Admission medications   Medication Sig Start Date End Date Taking? Authorizing Provider  amLODipine (NORVASC) 5 MG tablet Take 5 mg by mouth daily. 03/16/17  Yes [provider]  aspirin EC 81 MG tablet Take 81 mg by mouth every morning.   Yes [provider]  atorvastatin (LIPITOR) 20 MG tablet Take 20 mg daily by mouth.   Yes [provider]  Cholecalciferol 1000 UNITS tablet Take 1,000 Units by mouth daily.     Yes [provider]  fluticasone (FLONASE) 50 MCG/ACT nasal spray Place 2 sprays into both nostrils daily as needed (sinuses).  01/06/15  Yes [provider]  hydrocortisone (ANUSOL-HC) 25 MG suppository INSERT 1 SUPPOSITORY RECTALLY TWICE A DAY AS NEEDED FOR HEMORRHOIDS 02/07/15  Yes [place], Holli Humbles, NP  Lanreotide Acetate (SOMATULINE DEPOT [place]) Inject 120 mcg into the skin every 28 (twenty-eight) days. Receives at Dr Antonieta Pert office   Yes [provider]  lansoprazole (PREVACID) 30 MG capsule Take 1 capsule (30 mg total) by mouth daily at 12 noon. 11/19/16  Yes [place], Gay Filler, MD  levothyroxine (SYNTHROID, LEVOTHROID) 50 MCG tablet Take 1 tablet (50 mcg total) by mouth daily. 08/09/17  Yes [place], Gay Filler, MD  naproxen (NAPROSYN) 500 MG tablet Take 1 tablet (500 mg total) by mouth 2 (two) times daily with a meal. 08/16/17  Yes Leandrew Koyanagi, MD  nitroGLYCERIN (NITROSTAT) 0.4 MG SL tablet Place 1 tablet (0.4 mg total) under the tongue every 5 (five) minutes x 3 doses as needed for chest pain. 02/11/16  Yes Adrian Prows, MD  Omega-3 Fatty Acids (FISH OIL) 1000 MG CAPS Take 1 capsule by mouth daily. Reported on 12/30/2015   Yes [provider]  predniSONE (DELTASONE) 50 MG tablet Take 50mg  at 13 hours, 7 hours, and 1 hour before scan 02/18/17  Yes [place], Holli Humbles, NP  sodium chloride (OCEAN) 0.65 % SOLN nasal spray Place 1 spray into both nostrils as needed for congestion.    Yes [provider]  ticagrelor (BRILINTA) 90 MG TABS tablet Take 1 tablet (90 mg total) by mouth 2 (two) times daily. 02/11/16  Yes Adrian Prows, MD  tiZANidine (ZANAFLEX) 4 MG tablet Take 1 tablet (4 mg total) by mouth every 6 (six) hours as needed for muscle spasms. 08/16/17  Yes Leandrew Koyanagi, MD  vitamin B-12 (CYANOCOBALAMIN) 1000 MCG tablet Take 1,000 mcg by mouth daily.   Yes [provider]  diphenhydrAMINE (BENADRYL) 50 MG tablet Take 1 tablet (50 mg total) by mouth once. Take 1 hour prior to scan. 02/18/17 02/18/17  [place], Holli Humbles, NP  metoprolol tartrate (LOPRESSOR) 25 MG tablet Take 0.5 tablets (12.5 mg total) 2 (two)  times daily by mouth. 09/14/17   Alianna Wurster, Wonda Cheng, MD    Inpatient Medications: Scheduled Meds:  Continuous Infusions:  PRN Meds:   Allergies:    Allergies  Allergen Reactions  . Iohexol Hives     Code: HIVES, Desc: PER [name] @ PRIMARY CARE, PT IS ALLERGIC TO CONTRAST DYE 10/02/08/RM  05/01/10...needs full premeds per our protocol w/ [facility]., Onset Date: [id number]     Social History:   Social History   Socioeconomic History  . Marital status: Married  Spouse name: Not on file  . Number of children: 1  . Years of education: Not on file  . Highest education level: Not on file  Social Needs  . Financial resource strain: Not on file  . Food insecurity - worry: Not on file  . Food insecurity - inability: Not on file  . Transportation needs - medical: Not on file  . Transportation needs - non-medical: Not on file  Occupational History  . 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  Tobacco Use  . Smoking status: Former Smoker    Packs/day: 1.50    Years: 35.00    Pack years: 52.50    Types: Cigarettes    Start date: 09/25/1959    Last attempt to quit: 12/21/1997    Years since quitting: 19.9  . Smokeless tobacco: Never Used  Substance and Sexual Activity  . Alcohol use: Yes    Alcohol/week: 1.8 - 2.4 oz    Types: 3 - 4 Cans of beer per week    Comment: 4 beers weekly   . Drug use: No  . Sexual activity: Yes  Other Topics Concern  . Not on file  Social History Narrative   Regular Exercise -  NO    Family History:    Family History  Problem Relation Age of Onset  . Kidney disease Mother   . Hyperlipidemia Mother   . Hypertension Mother   . COPD Father   . Ulcerative colitis Daughter      ROS:  Noted in current history, all other systems are negative.  Physical Exam: Blood pressure (!) 120/56, pulse 69, height 5\' 10"  (1.778 m), weight 209 lb 3.2 oz (94.9 kg), SpO2 97 %.  GEN:  Well nourished, well developed in no acute distress HEENT:  Normal NECK: No JVD; No carotid bruits LYMPHATICS: No lymphadenopathy CARDIAC: RRR , soft systolic murmur  RESPIRATORY:  Clear to auscultation without rales, wheezing or rhonchi  ABDOMEN: Soft, non-tender, non-distended MUSCULOSKELETAL:  No edema; No deformity  SKIN: Warm and dry NEUROLOGIC:  Alert and oriented x 3   EKG:    Telemetry:    Relevant CV Studies:    Laboratory Data:  ChemistryNo results for input(s): NA, K, CL, CO2, GLUCOSE, BUN, CREATININE, CALCIUM, GFRNONAA, GFRAA, ANIONGAP in the last 168 hours.  No results for input(s): PROT, ALBUMIN, AST, ALT, ALKPHOS, BILITOT in the last 168 hours. HematologyNo results for input(s): WBC, RBC, HGB, HCT, MCV, MCH, MCHC, RDW, PLT in the last 168 hours. Cardiac EnzymesNo results for input(s): TROPONINI in the last 168 hours. No results for input(s): TROPIPOC in the last 168 hours.  BNPNo results for input(s): BNP, PROBNP in the last 168 hours.  DDimer No results for input(s): DDIMER in the last 168 hours.  Radiology/Studies:  No results found.  Assessment and Plan:   Orthostatic hypotension:     1. Coronary artery disease: Tyriek is doing well.  He has not had any episodes of chest pain.  Continue current medications.  2.  Hypertension: The patient was previously on losartan.  Losartan has been recalled and he is now on amlodipine.  He seems to be doing well.  He has had some episodes of orthostatic hypotension in the past but is not having any issues now.    We will check fasting lipids when I see him again in 6 months.    For questions or updates, please contact [place] Please consult [email or web address] for contact info under Cardiology/STEMI.  Signed, Mertie Moores, MD  12/13/2017 9:16 AM    .Horace Porteous

## 2017-12-14 ENCOUNTER — Encounter: Payer: Self-pay | Admitting: Hematology & Oncology

## 2017-12-14 ENCOUNTER — Inpatient Hospital Stay (HOSPITAL_BASED_OUTPATIENT_CLINIC_OR_DEPARTMENT_OTHER): Payer: Medicare HMO | Admitting: Hematology & Oncology

## 2017-12-14 ENCOUNTER — Inpatient Hospital Stay: Payer: Medicare HMO | Attending: Hematology & Oncology

## 2017-12-14 ENCOUNTER — Other Ambulatory Visit: Payer: Self-pay

## 2017-12-14 VITALS — BP 127/63 | HR 65 | Temp 98.2°F | Resp 20 | Wt 208.4 lb

## 2017-12-14 DIAGNOSIS — C7B8 Other secondary neuroendocrine tumors: Secondary | ICD-10-CM | POA: Insufficient documentation

## 2017-12-14 DIAGNOSIS — Z7982 Long term (current) use of aspirin: Secondary | ICD-10-CM | POA: Diagnosis not present

## 2017-12-14 DIAGNOSIS — C7A8 Other malignant neuroendocrine tumors: Secondary | ICD-10-CM

## 2017-12-14 LAB — CMP (CANCER CENTER ONLY)
ALK PHOS: 68 U/L (ref 40–150)
ALT: 14 U/L (ref 0–55)
ANION GAP: 12 — AB (ref 3–11)
AST: 28 U/L (ref 5–34)
Albumin: 3.5 g/dL (ref 3.5–5.0)
BILIRUBIN TOTAL: 0.7 mg/dL (ref 0.2–1.2)
BUN: 11 mg/dL (ref 7–26)
CALCIUM: 9.2 mg/dL (ref 8.4–10.4)
CO2: 25 mmol/L (ref 22–29)
CREATININE: 0.95 mg/dL (ref 0.70–1.30)
Chloride: 105 mmol/L (ref 98–109)
GFR, Estimated: >60 mL/min (ref >60)
Glucose, Bld: 133 mg/dL (ref 70–140)
Potassium: 4 mmol/L (ref 3.5–5.1)
SODIUM: 142 mmol/L (ref 136–145)
TOTAL PROTEIN: 7.3 g/dL (ref 6.4–8.3)

## 2017-12-14 LAB — CBC WITH DIFFERENTIAL (CANCER CENTER ONLY)
Basophils Absolute: 0 10*3/uL (ref 0.0–0.1)
Basophils Relative: 1 %
Eosinophils Absolute: 0.3 10*3/uL (ref 0.0–0.5)
Eosinophils Relative: 7 %
HEMATOCRIT: 35.8 % — AB (ref 38.7–49.9)
HEMOGLOBIN: 11.7 g/dL — AB (ref 13.0–17.1)
LYMPHS ABS: 0.7 10*3/uL — AB (ref 0.9–3.3)
LYMPHS PCT: 17 %
MCH: 27.9 pg — AB (ref 28.0–33.4)
MCHC: 32.7 g/dL (ref 32.0–35.9)
MCV: 85.4 fL (ref 82.0–98.0)
MONOS PCT: 7 %
Monocytes Absolute: 0.3 10*3/uL (ref 0.1–0.9)
NEUTROS ABS: 2.6 10*3/uL (ref 1.5–6.5)
NEUTROS PCT: 68 %
Platelet Count: 133 10*3/uL — ABNORMAL LOW (ref 145–400)
RBC: 4.19 MIL/uL — AB (ref 4.20–5.70)
RDW: 15.7 % (ref 11.1–15.7)
WBC: 3.9 10*3/uL — AB (ref 4.0–10.0)

## 2017-12-14 LAB — LACTATE DEHYDROGENASE: LDH: 216 U/L (ref 125–245)

## 2017-12-14 NOTE — Progress Notes (Signed)
Hematology and Oncology Follow Up Visit  Brian Mays [id number] 10-Mar-1946 72 y.o. 09/05/2017   Principle Diagnosis:  Metastatic low grade neuroendocrine tumor-hepatic metastases  Current Therapy:   S/p third yttrium-90 intrahepatic therapy - November 2016 Somatuline 120 mg monthly Lutathera (Lu 177) injection on 10/05/2017 - s/p cycle #3    Interim History: Mr. Eggert is back for follow-up.  He  had his third Lutathera injection a couple weeks ago.  He had Somatuline after that.  He feels good.  He actually played golf yesterday.  He has had no problems with diarrhea.  He has had no cough or shortness of breath.  He has had no bleeding.  He has had no diarrhea.  He has had no leg swelling.  His last chromogranin A was 12.  He has had no rashes.  He has had no headache.  Currently, his performance status is ECOG 1.  Medications:  Allergies as of 09/05/2017      Reactions   Iohexol Hives    Code: HIVES, Desc: PER [name] @ PRIMARY CARE, PT IS ALLERGIC TO CONTRAST DYE 10/02/08/RM  05/01/10...needs full premeds per our protocol w/ [facility]., Onset Date: [id number]      Medication List        Accurate as of 09/05/17 11:45 AM. Always use your most recent med list.          amLODipine 5 MG tablet Commonly known as:  NORVASC Take 5 mg by mouth daily.   aspirin EC 81 MG tablet Take 81 mg by mouth every morning.   atorvastatin 20 MG tablet Commonly known as:  LIPITOR Take 20 mg daily by mouth.   Cholecalciferol 1000 units tablet Take 1,000 Units by mouth daily.   diphenhydrAMINE 50 MG tablet Commonly known as:  BENADRYL Take 1 tablet (50 mg total) by mouth once. Take 1 hour prior to scan.   Fish Oil 1000 MG Caps Take 1 capsule by mouth daily. Reported on 12/30/2015   fluticasone 50 MCG/ACT nasal spray Commonly known as:  FLONASE Place 2 sprays into both nostrils daily as needed (sinuses).   hydrocortisone 25 MG suppository Commonly known as:  ANUSOL-HC INSERT 1  SUPPOSITORY RECTALLY TWICE A DAY AS NEEDED FOR HEMORRHOIDS   lansoprazole 30 MG capsule Commonly known as:  PREVACID Take 1 capsule (30 mg total) by mouth daily at 12 noon.   levothyroxine 50 MCG tablet Commonly known as:  SYNTHROID, LEVOTHROID Take 1 tablet (50 mcg total) by mouth daily.   losartan 100 MG tablet Commonly known as:  COZAAR TAKE 1 TABLET (100 MG TOTAL) BY MOUTH AT BEDTIME.   metoprolol tartrate 25 MG tablet Commonly known as:  LOPRESSOR Take 1 tablet (25 mg total) by mouth 2 (two) times daily.   naproxen 500 MG tablet Commonly known as:  NAPROSYN Take 1 tablet (500 mg total) by mouth 2 (two) times daily with a meal.   nitroGLYCERIN 0.4 MG SL tablet Commonly known as:  NITROSTAT Place 1 tablet (0.4 mg total) under the tongue every 5 (five) minutes x 3 doses as needed for chest pain.   predniSONE 50 MG tablet Commonly known as:  DELTASONE Take 50mg  at 13 hours, 7 hours, and 1 hour before scan   sodium chloride 0.65 % Soln nasal spray Commonly known as:  OCEAN Place 1 spray into both nostrils as needed for congestion.   SOMATULINE DEPOT [place] Inject 120 mcg into the skin every 28 (twenty-eight) days. Receives at Dr Antonieta Pert office  ticagrelor 90 MG Tabs tablet Commonly known as:  BRILINTA Take 1 tablet (90 mg total) by mouth 2 (two) times daily.   tiZANidine 4 MG tablet Commonly known as:  ZANAFLEX Take 1 tablet (4 mg total) by mouth every 6 (six) hours as needed for muscle spasms.   vitamin B-12 1000 MCG tablet Commonly known as:  CYANOCOBALAMIN Take 1,000 mcg by mouth daily.       Allergies:  Allergies  Allergen Reactions  . Iohexol Hives     Code: HIVES, Desc: PER [name] @ PRIMARY CARE, PT IS ALLERGIC TO CONTRAST DYE 10/02/08/RM  05/01/10...needs full premeds per our protocol w/ [facility]., Onset Date: [id number]     Past Medical History, Surgical history, Social history, and Family History were reviewed and updated.  Review of Systems: Review  of Systems  Constitutional: Negative for appetite change, fatigue, fever and unexpected weight change.  HENT:   Negative for lump/mass, mouth sores, sore throat and trouble swallowing.   Respiratory: Negative for cough, hemoptysis and shortness of breath.   Cardiovascular: Negative for leg swelling and palpitations.  Gastrointestinal: Negative for abdominal distention, abdominal pain, blood in stool, constipation, diarrhea, nausea and vomiting.  Genitourinary: Negative for bladder incontinence, dysuria, frequency and hematuria.   Musculoskeletal: Negative for arthralgias, back pain, gait problem and myalgias.  Skin: Negative for itching and rash.  Neurological: Negative for dizziness, extremity weakness, gait problem, headaches, numbness, seizures and speech difficulty.  Hematological: Does not bruise/bleed easily.  Psychiatric/Behavioral: Negative for depression and sleep disturbance. The patient is not nervous/anxious.      Physical Exam:  weight is 203 lb 4 oz (92.2 kg). His oral temperature is 98.3 F (36.8 C). His blood pressure is 113/63 and his pulse is 70. His respiration is 18 and oxygen saturation is 98%.   Wt Readings from Last 3 Encounters:  09/05/17 203 lb 4 oz (92.2 kg)  08/25/17 205 lb (93 kg)  07/25/17 205 lb (93 kg)    Physical Exam  Constitutional: He is oriented to person, place, and time.  HENT:  Head: Normocephalic and atraumatic.  Mouth/Throat: Oropharynx is clear and moist.  Eyes: EOM are normal. Pupils are equal, round, and reactive to light.  Neck: Normal range of motion.  Cardiovascular: Normal rate, regular rhythm and normal heart sounds.  Pulmonary/Chest: Effort normal and breath sounds normal.  Abdominal: Soft. Bowel sounds are normal.  Musculoskeletal: Normal range of motion. He exhibits no edema, tenderness or deformity.  Lymphadenopathy:    He has no cervical adenopathy.  Neurological: He is alert and oriented to person, place, and time.  Skin:  Skin is warm and dry. No rash noted. No erythema.  Psychiatric: He has a normal mood and affect. His behavior is normal. Judgment and thought content normal.  Vitals reviewed. .   Lab Results  Component Value Date   WBC 6.4 09/05/2017   HGB 12.9 (L) 09/05/2017   HCT 40.1 09/05/2017   MCV 83 09/05/2017   PLT 142 (L) 09/05/2017   No results found for: FERRITIN, IRON, TIBC, UIBC, IRONPCTSAT Lab Results  Component Value Date   RBC 4.81 09/05/2017   No results found for: KPAFRELGTCHN, LAMBDASER, KAPLAMBRATIO No results found for: IGGSERUM, IGA, IGMSERUM No results found for: Ronnald Ramp, A1GS, A2GS, Violet Baldy, MSPIKE, SPEI   Chemistry      Component Value Date/Time   NA 145 09/05/2017 1015   NA 141 07/23/2016 1255   K 4.2 09/05/2017 1015   K 4.1  07/23/2016 1255   CL 105 09/05/2017 1015   CO2 29 09/05/2017 1015   CO2 27 07/23/2016 1255   BUN 16 09/05/2017 1015   BUN 13.8 07/23/2016 1255   CREATININE 1.1 09/05/2017 1015   CREATININE 1.0 07/23/2016 1255      Component Value Date/Time   CALCIUM 9.9 09/05/2017 1015   CALCIUM 9.2 07/23/2016 1255   ALKPHOS 66 09/05/2017 1015   ALKPHOS 77 07/23/2016 1255   AST 38 09/05/2017 1015   AST 23 07/23/2016 1255   ALT 31 09/05/2017 1015   ALT 13 07/23/2016 1255   BILITOT 1.00 09/05/2017 1015   BILITOT 1.13 07/23/2016 1255     Impression and Plan: Mr. Bendickson is a 72 year old white male. He has metastatic low-grade neuroendocrine carcinoma. He has hepatic metastases.  I do believe that the Lutathera is going to help.  He still is too early in his treatment cycle to do another PET scan that is specific for neuroendocrine cancers.  For right now, his next Lutathera will be in early April.  We will get him back after his Lutathera injection.  We will give him a Somatuline injection in March.  He says that radiology is already set up a Dot[place] PET scan before May.   Burney Gauze, MD  11/5/201811:45  AM

## 2017-12-16 LAB — CHROMOGRANIN A: CHROMOGRANIN A: 7 nmol/L — AB (ref 0–5)

## 2017-12-30 ENCOUNTER — Other Ambulatory Visit (HOSPITAL_COMMUNITY): Payer: Medicare HMO

## 2018-01-07 ENCOUNTER — Encounter: Payer: Self-pay | Admitting: Family

## 2018-01-11 ENCOUNTER — Ambulatory Visit: Payer: Medicare HMO

## 2018-01-11 ENCOUNTER — Inpatient Hospital Stay: Payer: Medicare HMO | Attending: Hematology & Oncology | Admitting: Hematology & Oncology

## 2018-01-11 ENCOUNTER — Other Ambulatory Visit: Payer: Self-pay

## 2018-01-11 ENCOUNTER — Inpatient Hospital Stay: Payer: Medicare HMO

## 2018-01-11 ENCOUNTER — Encounter: Payer: Self-pay | Admitting: Hematology & Oncology

## 2018-01-11 VITALS — BP 135/62 | HR 70 | Temp 97.9°F | Resp 16 | Wt 211.4 lb

## 2018-01-11 DIAGNOSIS — C7B8 Other secondary neuroendocrine tumors: Secondary | ICD-10-CM | POA: Diagnosis not present

## 2018-01-11 DIAGNOSIS — C7A8 Other malignant neuroendocrine tumors: Secondary | ICD-10-CM

## 2018-01-11 LAB — CMP (CANCER CENTER ONLY)
ALT: 27 U/L (ref 10–47)
ANION GAP: 11 (ref 5–15)
AST: 31 U/L (ref 11–38)
Albumin: 3.3 g/dL — ABNORMAL LOW (ref 3.5–5.0)
Alkaline Phosphatase: 69 U/L (ref 26–84)
BUN: 11 mg/dL (ref 7–22)
CALCIUM: 9.6 mg/dL (ref 8.0–10.3)
CO2: 32 mmol/L (ref 18–33)
Chloride: 106 mmol/L (ref 98–108)
Creatinine: 1 mg/dL (ref 0.60–1.20)
GLUCOSE: 157 mg/dL — AB (ref 73–118)
POTASSIUM: 4.2 mmol/L (ref 3.3–4.7)
SODIUM: 149 mmol/L — AB (ref 128–145)
TOTAL PROTEIN: 7.8 g/dL (ref 6.4–8.1)
Total Bilirubin: 0.9 mg/dL (ref 0.2–1.6)

## 2018-01-11 LAB — CBC WITH DIFFERENTIAL (CANCER CENTER ONLY)
BASOS PCT: 0 %
Basophils Absolute: 0 10*3/uL (ref 0.0–0.1)
Eosinophils Absolute: 0.3 10*3/uL (ref 0.0–0.5)
Eosinophils Relative: 7 %
HEMATOCRIT: 36.4 % — AB (ref 38.7–49.9)
HEMOGLOBIN: 11.8 g/dL — AB (ref 13.0–17.1)
LYMPHS ABS: 0.5 10*3/uL — AB (ref 0.9–3.3)
LYMPHS PCT: 12 %
MCH: 28 pg (ref 28.0–33.4)
MCHC: 32.4 g/dL (ref 32.0–35.9)
MCV: 86.5 fL (ref 82.0–98.0)
MONOS PCT: 8 %
Monocytes Absolute: 0.4 10*3/uL (ref 0.1–0.9)
NEUTROS ABS: 3.3 10*3/uL (ref 1.5–6.5)
NEUTROS PCT: 73 %
Platelet Count: 130 10*3/uL — ABNORMAL LOW (ref 145–400)
RBC: 4.21 MIL/uL (ref 4.20–5.70)
RDW: 15.7 % (ref 11.1–15.7)
WBC Count: 4.5 10*3/uL (ref 4.0–10.0)

## 2018-01-11 NOTE — Progress Notes (Signed)
Hematology and Oncology Follow Up Visit  Brian Mays [id number] [birth year] 72 y.o. 09/05/2017   Principle Diagnosis:  Metastatic low grade neuroendocrine tumor-hepatic metastases  Current Therapy:   S/p third yttrium-90 intrahepatic therapy - November 2016 Somatuline 120 mg monthly Lutathera (Lu 177) injection on 10/05/2017 - s/p cycle #4    Interim History: Brian Mays is back for follow-up.  He is doing quite well.  He feels okay.  He is able to play a little golf a couple weeks ago.  He is still working.  He is a Biochemist, clinical.  He does do quite a bit of traveling.  His last chromogranin A  was 12.  He has had no nausea or vomiting.  He has had no diarrhea.  He has had no flushing.  He has had no wheezing.  He feels a lot better now that his blood pressure medications have been adjusted.  He currently is on amlodipine and metoprolol.  Unfortunately, his wife is now doing too well.  She is having a lot of back issues.  I gave him the name of a referral orthopedist.  Hopefully, Brian Mays will be able to help his wife.  He is due for his next Lutathera in April.  Currently, his performance status is ECOG 1.  Medications:  Allergies as of 09/05/2017      Reactions   Iohexol Hives    Code: HIVES, Desc: PER [name] @ [facility], PT IS ALLERGIC TO CONTRAST DYE 10/02/08/RM  05/01/10...needs full premeds per our protocol w/ [facility]., Onset Date: [id number]      Medication List        Accurate as of 09/05/17 11:45 AM. Always use your most recent med list.          amLODipine 5 MG tablet Commonly known as:  NORVASC Take 5 mg by mouth daily.   aspirin EC 81 MG tablet Take 81 mg by mouth every morning.   atorvastatin 20 MG tablet Commonly known as:  LIPITOR Take 20 mg daily by mouth.   Cholecalciferol 1000 units tablet Take 1,000 Units by mouth daily.   diphenhydrAMINE 50 MG tablet Commonly known as:  BENADRYL Take 1 tablet (50 mg total) by mouth once. Take 1 hour prior  to scan.   Fish Oil 1000 MG Caps Take 1 capsule by mouth daily. Reported on 12/30/2015   fluticasone 50 MCG/ACT nasal spray Commonly known as:  FLONASE Place 2 sprays into both nostrils daily as needed (sinuses).   hydrocortisone 25 MG suppository Commonly known as:  ANUSOL-HC INSERT 1 SUPPOSITORY RECTALLY TWICE A DAY AS NEEDED FOR HEMORRHOIDS   lansoprazole 30 MG capsule Commonly known as:  PREVACID Take 1 capsule (30 mg total) by mouth daily at 12 noon.   levothyroxine 50 MCG tablet Commonly known as:  SYNTHROID, LEVOTHROID Take 1 tablet (50 mcg total) by mouth daily.   losartan 100 MG tablet Commonly known as:  COZAAR TAKE 1 TABLET (100 MG TOTAL) BY MOUTH AT BEDTIME.   metoprolol tartrate 25 MG tablet Commonly known as:  LOPRESSOR Take 1 tablet (25 mg total) by mouth 2 (two) times daily.   naproxen 500 MG tablet Commonly known as:  NAPROSYN Take 1 tablet (500 mg total) by mouth 2 (two) times daily with a meal.   nitroGLYCERIN 0.4 MG SL tablet Commonly known as:  NITROSTAT Place 1 tablet (0.4 mg total) under the tongue every 5 (five) minutes x 3 doses as needed for chest pain.  predniSONE 50 MG tablet Commonly known as:  DELTASONE Take 50mg  at 13 hours, 7 hours, and 1 hour before scan   sodium chloride 0.65 % Soln nasal spray Commonly known as:  OCEAN Place 1 spray into both nostrils as needed for congestion.   SOMATULINE DEPOT [place] Inject 120 mcg into the skin every 28 (twenty-eight) days. Receives at Dr Brian Mays office   ticagrelor 90 MG Tabs tablet Commonly known as:  BRILINTA Take 1 tablet (90 mg total) by mouth 2 (two) times daily.   tiZANidine 4 MG tablet Commonly known as:  ZANAFLEX Take 1 tablet (4 mg total) by mouth every 6 (six) hours as needed for muscle spasms.   vitamin B-12 1000 MCG tablet Commonly known as:  CYANOCOBALAMIN Take 1,000 mcg by mouth daily.       Allergies:  Allergies  Allergen Reactions  . Iohexol Hives     Code: HIVES,  Desc: PER [name] @ [facility], PT IS ALLERGIC TO CONTRAST DYE 10/02/08/RM  05/01/10...needs full premeds per our protocol w/ [facility]., Onset Date: [id number]     Past Medical History, Surgical history, Social history, and Family History were reviewed and updated.  Review of Systems: Review of Systems  Constitutional: Negative for appetite change, fatigue, fever and unexpected weight change.  HENT:   Negative for lump/mass, mouth sores, sore throat and trouble swallowing.   Respiratory: Negative for cough, hemoptysis and shortness of breath.   Cardiovascular: Negative for leg swelling and palpitations.  Gastrointestinal: Negative for abdominal distention, abdominal pain, blood in stool, constipation, diarrhea, nausea and vomiting.  Genitourinary: Negative for bladder incontinence, dysuria, frequency and hematuria.   Musculoskeletal: Negative for arthralgias, back pain, gait problem and myalgias.  Skin: Negative for itching and rash.  Neurological: Negative for dizziness, extremity weakness, gait problem, headaches, numbness, seizures and speech difficulty.  Hematological: Does not bruise/bleed easily.  Psychiatric/Behavioral: Negative for depression and sleep disturbance. The patient is not nervous/anxious.      Physical Exam:  weight is 203 lb 4 oz (92.2 kg). His oral temperature is 98.3 F (36.8 C). His blood pressure is 113/63 and his pulse is 70. His respiration is 18 and oxygen saturation is 98%.   Wt Readings from Last 3 Encounters:  09/05/17 203 lb 4 oz (92.2 kg)  08/25/17 205 lb (93 kg)  07/25/17 205 lb (93 kg)    Physical Exam  Constitutional: He is oriented to person, place, and time.  HENT:  Head: Normocephalic and atraumatic.  Mouth/Throat: Oropharynx is clear and moist.  Eyes: EOM are normal. Pupils are equal, round, and reactive to light.  Neck: Normal range of motion.  Cardiovascular: Normal rate, regular rhythm and normal heart sounds.  Pulmonary/Chest: Effort  normal and breath sounds normal.  Abdominal: Soft. Bowel sounds are normal.  Musculoskeletal: Normal range of motion. He exhibits no edema, tenderness or deformity.  Lymphadenopathy:    He has no cervical adenopathy.  Neurological: He is alert and oriented to person, place, and time.  Skin: Skin is warm and dry. No rash noted. No erythema.  Psychiatric: He has a normal mood and affect. His behavior is normal. Judgment and thought content normal.  Vitals reviewed. .   Lab Results  Component Value Date   WBC 6.4 09/05/2017   HGB 12.9 (L) 09/05/2017   HCT 40.1 09/05/2017   MCV 83 09/05/2017   PLT 142 (L) 09/05/2017   No results found for: FERRITIN, IRON, TIBC, UIBC, IRONPCTSAT Lab Results  Component Value Date  RBC 4.81 09/05/2017   No results found for: KPAFRELGTCHN, LAMBDASER, KAPLAMBRATIO No results found for: IGGSERUM, IGA, IGMSERUM No results found for: Odetta Pink, SPEI   Chemistry      Component Value Date/Time   NA 145 09/05/2017 1015   NA 141 07/23/2016 1255   K 4.2 09/05/2017 1015   K 4.1 07/23/2016 1255   CL 105 09/05/2017 1015   CO2 29 09/05/2017 1015   CO2 27 07/23/2016 1255   BUN 16 09/05/2017 1015   BUN 13.8 07/23/2016 1255   CREATININE 1.1 09/05/2017 1015   CREATININE 1.0 07/23/2016 1255      Component Value Date/Time   CALCIUM 9.9 09/05/2017 1015   CALCIUM 9.2 07/23/2016 1255   ALKPHOS 66 09/05/2017 1015   ALKPHOS 77 07/23/2016 1255   AST 38 09/05/2017 1015   AST 23 07/23/2016 1255   ALT 31 09/05/2017 1015   ALT 13 07/23/2016 1255   BILITOT 1.00 09/05/2017 1015   BILITOT 1.13 07/23/2016 1255     Impression and Plan: Mr. Popowski is a 72 year old white male. He has metastatic low-grade neuroendocrine carcinoma. He has hepatic metastases.  So far, his quality of life is doing quite nicely.  We will plan to get him back in 6 weeks.  I think this would be reasonable.  Radiology is taking care of  his scans.    Burney Gauze, MD  11/5/201811:45 AM

## 2018-01-13 LAB — CHROMOGRANIN A: Chromogranin A: 11 nmol/L — ABNORMAL HIGH (ref 0–5)

## 2018-01-31 ENCOUNTER — Other Ambulatory Visit (HOSPITAL_COMMUNITY): Payer: Self-pay | Admitting: Diagnostic Radiology

## 2018-01-31 DIAGNOSIS — C7A8 Other malignant neuroendocrine tumors: Secondary | ICD-10-CM

## 2018-02-01 ENCOUNTER — Ambulatory Visit: Payer: Medicare HMO

## 2018-02-01 ENCOUNTER — Encounter (HOSPITAL_COMMUNITY)
Admission: RE | Admit: 2018-02-01 | Discharge: 2018-02-01 | Disposition: A | Discharge status: Home / Self Care | Payer: Medicare HMO | Source: Ambulatory Visit | Attending: Hematology & Oncology | Admitting: Hematology & Oncology

## 2018-02-01 DIAGNOSIS — C7A8 Other malignant neuroendocrine tumors: Secondary | ICD-10-CM | POA: Diagnosis present

## 2018-02-01 DIAGNOSIS — C7A1 Malignant poorly differentiated neuroendocrine tumors: Secondary | ICD-10-CM | POA: Diagnosis not present

## 2018-02-01 IMAGING — NM NM [PERSON_NAME] ADMINISTRATION
1 series · 1 of 1 positions shown · non-contrast
Comparison: none

CLINICAL DATA: [age] year-old male with metastatic neuroendocrine
tumor. Well differentiated tumor with somatostatin receptor is
identified within the multiple liver metastasis in central abdominal
metastasis by DOTATATE PET CT scan.

EXAM:
NUCLEAR MEDICINE LUTATHERA ADMINISTRATION
TECHNIQUE: Infusion: The nuclear medicine technologist and I personally
verified the dose activity (204 mCi) to be delivered as specified in
the written directive (200 mCi), and verified the patient
identification via 2 separate methods. 20 gauge IV were started in
the antecubital veins. Anti-emetics were administered by nursing
staff. PETTACCI acid renal protection was initiated 30 minutes prior to
Lu 177 DOTATATE (Lutathera) infusion and continued continuously for
4 hours. Lutathera infusion was administered over 30 minutes.

[Series 1: static · 2.07mm/px · 1 of 1 slices shown]
[im 1/1]
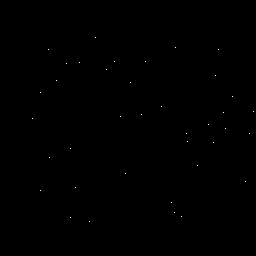

[1 of 1 positions shown; findings below may reference images not displayed]

The total administered dose was 201 mCi Lu 177 DOTATATE.

The entire IV tubing, venocatheter, stopcock and syringes was
removed in total, placed in a disposal bag and sent for assay of the
residual activity, which will be reported at a later time in our EMR
by the physics staff. Pressure was applied to the venipuncture site,
and a compression bandage placed. Radiation Safety personnel were
present to perform the discharge survey, as detailed on their
documentation.

Patient received 30 mg IM long-acting Sandostatin injection 4 hours
after Lutathera effusion in the nuclear medicine department.

RADIOPHARMACEUTICALS:  mCi Lu 177 DOTATATE
FINDINGS: Diagnosis: Metastatic neuroendocrine tumor.

Current Infusion: 4

Planned Infusions: 4

Patient presented to nuclear medicine for treatment. Patient reports
some improvement in fatigue. Chromogranin remains low at 11. The
patient's most recent blood counts were reviewed and remains a good
candidate to proceed with Lutathera.

The patient was situated in an infusion suite and administered
Lutathera as above. Patient will follow-up with referring oncologist
for interval serum laboratories.

Patient received 30 mg IM long-acting Sandostatin injection 4 hours
after Lutathera effusion in the nuclear medicine department.
IMPRESSION: Fourth and final Lu 177 DOTATATE treatment for metastatic
neuroendocrine tumor. The patient tolerated the infusion well. The
patient will return in approximately 4 weeks for DOTATATE PET-CT
scan and evaluation

## 2018-02-01 MED ORDER — OCTREOTIDE ACETATE 30 MG IM KIT
30.0000 mg | PACK | Freq: Once | INTRAMUSCULAR | Status: AC
  Administered 2018-02-01: 30 mg via INTRAMUSCULAR

## 2018-02-01 MED ORDER — OCTREOTIDE ACETATE 30 MG IM KIT
PACK | INTRAMUSCULAR | Status: AC
  Filled 2018-02-01: qty 1

## 2018-02-01 MED ORDER — PROCHLORPERAZINE EDISYLATE 5 MG/ML IJ SOLN
10.0000 mg | Freq: Four times a day (QID) | INTRAMUSCULAR | Status: DC | PRN
  Filled 2018-02-01: qty 2

## 2018-02-01 MED ORDER — LUTETIUM LU 177 DOTATATE 370 MBQ/ML IV SOLN
200.0000 | Freq: Once | INTRAVENOUS | Status: AC
  Administered 2018-02-01: 200 via INTRAVENOUS

## 2018-02-01 MED ORDER — OCTREOTIDE ACETATE 500 MCG/ML IJ SOLN: 500.0000 ug | Freq: Once | INTRAMUSCULAR | Status: DC | PRN

## 2018-02-01 MED ORDER — AMINO ACID RADIOPROTECTANT - L-LYSINE 2.5%/L-ARGININE 2.5% IN NS
250.0000 mL/h | INTRAVENOUS | Status: AC
  Administered 2018-02-01: 250 mL/h via INTRAVENOUS
  Filled 2018-02-01: qty 1000

## 2018-02-01 MED ORDER — SODIUM CHLORIDE 0.9 % IV SOLN: 500.0000 mL | Freq: Once | INTRAVENOUS | Status: DC

## 2018-02-01 MED ORDER — OCTREOTIDE ACETATE 500 MCG/ML IJ SOLN
INTRAMUSCULAR | Status: AC
  Filled 2018-02-01: qty 1

## 2018-02-01 MED ORDER — SODIUM CHLORIDE 0.9 % IV SOLN
8.0000 mg | Freq: Once | INTRAVENOUS | Status: AC
  Administered 2018-02-01: 8 mg via INTRAVENOUS
  Filled 2018-02-01: qty 4

## 2018-02-01 NOTE — Progress Notes (Signed)
Pt tolerated Lutathera with no issues.  Pt's left AC IV was flushed with 65ml of IV saline after Lutathera administration.  Redness noted on pt's right IV and amnino acid infusion was paused.  IV was flushed which caused pt some pain.  Amino acid infusion was switched to pt's left IV and the right IV was removed.  Pt then voided.  Will continue to monitor pt's arms for any complications. Pt denies any symptoms at this time.

## 2018-02-01 NOTE — Progress Notes (Signed)
Diagnosis: [Metastatic neuroendocrine tumor.]  Current Infusion: [4]  Planned Infusions: [4]  Patient presented to nuclear medicine for treatment.  Patient reportssome improvement infatigue. Chromogranin remains low at 11. The patient's most recent blood counts were reviewed and remains a good candidate to proceed with Lutathera.    The patient was situated in an infusion suite and administered Lutathera as above. Mild facial flushing with treatment.    Patient received 30 mg IM long-acting Sandostatin injection 4 hours after Lutathera effusion in the nuclear medicine department.   [Fourth and final] YY [id number] treatment for metastatic neuroendocrine tumor. The patient tolerated the infusion well. The patient will return in approximately 4 weeks for DOTATATE PET-CT scan and evaluation

## 2018-02-01 NOTE — Progress Notes (Signed)
The redness on Brian Mays's right arm has improved.  Brian Mays continues to deny any pain.  Dr. Leonia Reeves aware of Brian Mays's elevated BP.

## 2018-02-01 NOTE — Progress Notes (Addendum)
Pt was ambulating in the room prior to taking this BP measurement (171/90).

## 2018-02-02 ENCOUNTER — Other Ambulatory Visit (HOSPITAL_COMMUNITY): Payer: Medicare HMO

## 2018-02-22 ENCOUNTER — Inpatient Hospital Stay: Payer: Medicare HMO

## 2018-02-22 ENCOUNTER — Inpatient Hospital Stay: Payer: Medicare HMO | Attending: Hematology & Oncology | Admitting: Hematology & Oncology

## 2018-02-22 VITALS — BP 147/69 | HR 65 | Temp 98.4°F | Resp 17 | Wt 209.8 lb

## 2018-02-22 DIAGNOSIS — C7B8 Other secondary neuroendocrine tumors: Secondary | ICD-10-CM

## 2018-02-22 DIAGNOSIS — C7A8 Other malignant neuroendocrine tumors: Secondary | ICD-10-CM | POA: Insufficient documentation

## 2018-02-22 DIAGNOSIS — Z79899 Other long term (current) drug therapy: Secondary | ICD-10-CM | POA: Diagnosis not present

## 2018-02-22 LAB — CBC WITH DIFFERENTIAL (CANCER CENTER ONLY)
BASOS ABS: 0 10*3/uL (ref 0.0–0.1)
Basophils Relative: 1 %
EOS ABS: 0.3 10*3/uL (ref 0.0–0.5)
EOS PCT: 7 %
HCT: 35.1 % — ABNORMAL LOW (ref 38.7–49.9)
Hemoglobin: 11.4 g/dL — ABNORMAL LOW (ref 13.0–17.1)
LYMPHS ABS: 0.4 10*3/uL — AB (ref 0.9–3.3)
LYMPHS PCT: 11 %
MCH: 28.6 pg (ref 28.0–33.4)
MCHC: 32.5 g/dL (ref 32.0–35.9)
MCV: 88 fL (ref 82.0–98.0)
Monocytes Absolute: 0.4 10*3/uL (ref 0.1–0.9)
Monocytes Relative: 11 %
NEUTROS PCT: 70 %
Neutro Abs: 2.7 10*3/uL (ref 1.5–6.5)
PLATELETS: 123 10*3/uL — AB (ref 145–400)
RBC: 3.99 MIL/uL — AB (ref 4.20–5.70)
RDW: 15.9 % — ABNORMAL HIGH (ref 11.1–15.7)
WBC: 3.8 10*3/uL — AB (ref 4.0–10.0)

## 2018-02-22 LAB — CMP (CANCER CENTER ONLY)
ALT: 25 U/L (ref 10–47)
AST: 28 U/L (ref 11–38)
Albumin: 3.2 g/dL — ABNORMAL LOW (ref 3.5–5.0)
Alkaline Phosphatase: 60 U/L (ref 26–84)
Anion gap: 9 (ref 5–15)
BUN: 12 mg/dL (ref 7–22)
CALCIUM: 9.4 mg/dL (ref 8.0–10.3)
CO2: 33 mmol/L (ref 18–33)
Chloride: 105 mmol/L (ref 98–108)
Creatinine: 0.8 mg/dL (ref 0.60–1.20)
GLUCOSE: 120 mg/dL — AB (ref 73–118)
POTASSIUM: 4.8 mmol/L — AB (ref 3.3–4.7)
SODIUM: 147 mmol/L — AB (ref 128–145)
TOTAL PROTEIN: 7.2 g/dL (ref 6.4–8.1)
Total Bilirubin: 0.7 mg/dL (ref 0.2–1.6)

## 2018-02-22 NOTE — Progress Notes (Signed)
Hematology and Oncology Follow Up Visit  Brian Mays [id number] Aug 02, 1946 72 y.o. 09/05/2017   Principle Diagnosis:  Metastatic low grade neuroendocrine tumor-hepatic metastases  Current Therapy:   S/p yttrium-90 intrahepatic therapy - November 2016 Somatuline 120 mg monthly Lutathera (Lu 177) injection on 02/01/2018 - s/p cycle #4    Interim History: [name] is back for follow-up.  He is doing quite well.  He feels okay.  He completed all of his Lutathera treatments about a month ago.  He tolerated this well.  He goes for a Lutathera scan in 2 weeks.  His wife will be in [place] for another month.  She is visiting her mom.  He is playing golf.  He is still working.  He tries to combine the 2.  He has had no problems with diarrhea.  He has had no problems with nausea or vomiting.  He has had no cough.  Is had no rashes.  There is been no leg swelling.  Overall, his performance status is ECOG 0.   We will see what his scan has to show.  He will be very interesting to see how this looks. Medications:  Allergies as of 09/05/2017      Reactions   Iohexol Hives    Code: HIVES, Desc: PER [name] @ [facility]IMARY [facility]ARE, PT IS ALLERGIC TO CONTRAST DYE 10/02/08/RM  05/01/10...needs full premeds per our protocol w/ [facility]., Onset Date: [id number]      Medication List        Accurate as of 09/05/17 11:45 AM. Always use your most recent med list.          amLODipine 5 MG tablet Commonly known as:  NORVASC Take 5 mg by mouth daily.   aspirin EC 81 MG tablet Take 81 mg by mouth every morning.   atorvastatin 20 MG tablet Commonly known as:  LIPITOR Take 20 mg daily by mouth.   Cholecalciferol 1000 units tablet Take 1,000 Units by mouth daily.   diphenhydrAMINE 50 MG tablet Commonly known as:  BENADRYL Take 1 tablet (50 mg total) by mouth once. Take 1 hour prior to scan.   Fish Oil 1000 MG Caps Take 1 capsule by mouth daily. Reported on 12/30/2015   fluticasone 50 MCG/ACT nasal  spray Commonly known as:  FLONASE Place 2 sprays into both nostrils daily as needed (sinuses).   hydrocortisone 25 MG suppository Commonly known as:  ANUSOL-HC INSERT 1 SUPPOSITORY RECTALLY TWICE A DAY AS NEEDED FOR HEMORRHOIDS   lansoprazole 30 MG capsule Commonly known as:  PREVACID Take 1 capsule (30 mg total) by mouth daily at 12 noon.   levothyroxine 50 MCG tablet Commonly known as:  SYNTHROID, LEVOTHROID Take 1 tablet (50 mcg total) by mouth daily.   losartan 100 MG tablet Commonly known as:  COZAAR TAKE 1 TABLET (100 MG TOTAL) BY MOUTH AT BEDTIME.   metoprolol tartrate 25 MG tablet Commonly known as:  LOPRESSOR Take 1 tablet (25 mg total) by mouth 2 (two) times daily.   naproxen 500 MG tablet Commonly known as:  NAPROSYN Take 1 tablet (500 mg total) by mouth 2 (two) times daily with a meal.   nitroGLYCERIN 0.4 MG SL tablet Commonly known as:  NITROSTAT Place 1 tablet (0.4 mg total) under the tongue every 5 (five) minutes x 3 doses as needed for chest pain.   predniSONE 50 MG tablet Commonly known as:  DELTASONE Take 50mg  at 13 hours, 7 hours, and 1 hour before scan  sodium chloride 0.65 % Soln nasal spray Commonly known as:  OCEAN Place 1 spray into both nostrils as needed for congestion.   SOMATULINE DEPOT [place] Inject 120 mcg into the skin every 28 (twenty-eight) days. Receives at Dr Antonieta Pert office   ticagrelor 90 MG Tabs tablet Commonly known as:  BRILINTA Take 1 tablet (90 mg total) by mouth 2 (two) times daily.   tiZANidine 4 MG tablet Commonly known as:  ZANAFLEX Take 1 tablet (4 mg total) by mouth every 6 (six) hours as needed for muscle spasms.   vitamin B-12 1000 MCG tablet Commonly known as:  CYANOCOBALAMIN Take 1,000 mcg by mouth daily.       Allergies:  Allergies  Allergen Reactions  . Iohexol Hives     Code: HIVES, Desc: PER [name] @ [facility]IMARY [facility]ARE, PT IS ALLERGIC TO CONTRAST DYE 10/02/08/RM  05/01/10...needs full premeds per our protocol w/  [facility]., Onset Date: [id number]     Past Medical History, Surgical history, Social history, and Family History were reviewed and updated.  Review of Systems: Review of Systems  Constitutional: Negative for appetite change, fatigue, fever and unexpected weight change.  HENT:   Negative for lump/mass, mouth sores, sore throat and trouble swallowing.   Respiratory: Negative for cough, hemoptysis and shortness of breath.   Cardiovascular: Negative for leg swelling and palpitations.  Gastrointestinal: Negative for abdominal distention, abdominal pain, blood in stool, constipation, diarrhea, nausea and vomiting.  Genitourinary: Negative for bladder incontinence, dysuria, frequency and hematuria.   Musculoskeletal: Negative for arthralgias, back pain, gait problem and myalgias.  Skin: Negative for itching and rash.  Neurological: Negative for dizziness, extremity weakness, gait problem, headaches, numbness, seizures and speech difficulty.  Hematological: Does not bruise/bleed easily.  Psychiatric/Behavioral: Negative for depression and sleep disturbance. The patient is not nervous/anxious.      Physical Exam:  weight is 203 lb 4 oz (92.2 kg). His oral temperature is 98.3 F (36.8 C). His blood pressure is 113/63 and his pulse is 70. His respiration is 18 and oxygen saturation is 98%.   Wt Readings from Last 3 Encounters:  09/05/17 203 lb 4 oz (92.2 kg)  08/25/17 205 lb (93 kg)  07/25/17 205 lb (93 kg)    Physical Exam  Constitutional: He is oriented to person, place, and time.  HENT:  Head: Normocephalic and atraumatic.  Mouth/Throat: Oropharynx is clear and moist.  Eyes: Pupils are equal, round, and reactive to light. EOM are normal.  Neck: Normal range of motion.  Cardiovascular: Normal rate, regular rhythm and normal heart sounds.  Pulmonary/Chest: Effort normal and breath sounds normal.  Abdominal: Soft. Bowel sounds are normal.  Musculoskeletal: Normal range of motion. He  exhibits no edema, tenderness or deformity.  Lymphadenopathy:    He has no cervical adenopathy.  Neurological: He is alert and oriented to person, place, and time.  Skin: Skin is warm and dry. No rash noted. No erythema.  Psychiatric: He has a normal mood and affect. His behavior is normal. Judgment and thought content normal.  Vitals reviewed. .   Lab Results  Component Value Date   WBC 6.4 09/05/2017   HGB 12.9 (L) 09/05/2017   HCT 40.1 09/05/2017   MCV 83 09/05/2017   PLT 142 (L) 09/05/2017   No results found for: FERRITIN, IRON, TIBC, UIBC, IRONPCTSAT Lab Results  Component Value Date   RBC 4.81 09/05/2017   No results found for: KPAFRELGTCHN, LAMBDASER, KAPLAMBRATIO No results found for: IGGSERUM, IGA, IGMSERUM No results  found for: Odetta Pink, SPEI   Chemistry      Component Value Date/Time   NA 145 09/05/2017 1015   NA 141 07/23/2016 1255   K 4.2 09/05/2017 1015   K 4.1 07/23/2016 1255   CL 105 09/05/2017 1015   CO2 29 09/05/2017 1015   CO2 27 07/23/2016 1255   BUN 16 09/05/2017 1015   BUN 13.8 07/23/2016 1255   CREATININE 1.1 09/05/2017 1015   CREATININE 1.0 07/23/2016 1255      Component Value Date/Time   CALCIUM 9.9 09/05/2017 1015   CALCIUM 9.2 07/23/2016 1255   ALKPHOS 66 09/05/2017 1015   ALKPHOS 77 07/23/2016 1255   AST 38 09/05/2017 1015   AST 23 07/23/2016 1255   ALT 31 09/05/2017 1015   ALT 13 07/23/2016 1255   BILITOT 1.00 09/05/2017 1015   BILITOT 1.13 07/23/2016 1255     Impression and Plan: Mr. Gulley is a 72 year old white male. He has metastatic low-grade neuroendocrine carcinoma. He has hepatic metastases.  So far, his quality of life is doing quite nicely.  It will be interesting to see what his scan shows.  I will plan to get him back after [facility].  We will restart his Somatuline injections at that time.   Burney Gauze, MD  11/5/201811:45 AM

## 2018-02-24 LAB — CHROMOGRANIN A: Chromogranin A: 9 nmol/L — ABNORMAL HIGH (ref 0–5)

## 2018-03-03 ENCOUNTER — Encounter (HOSPITAL_COMMUNITY)
Admission: RE | Admit: 2018-03-03 | Discharge: 2018-03-03 | Disposition: A | Discharge status: Home / Self Care | Payer: Medicare HMO | Source: Ambulatory Visit | Attending: Hematology & Oncology | Admitting: Hematology & Oncology

## 2018-03-03 DIAGNOSIS — C7A1 Malignant poorly differentiated neuroendocrine tumors: Secondary | ICD-10-CM | POA: Diagnosis not present

## 2018-03-03 DIAGNOSIS — C7A8 Other malignant neuroendocrine tumors: Secondary | ICD-10-CM | POA: Diagnosis not present

## 2018-03-03 IMAGING — PT NM PET NOPR SKULL BASE TO THIGH
8 series · 25 of 25 positions shown · non-contrast
Comparison: None.

CLINICAL DATA: Subsequent treatment strategy for well
differentiated neuroendocrine tumor. Patient status post 4 of 4
rounds of peptide receptor radiotherapy with Lutathera (Lu 177
DOTATATE).

EXAM:
NUCLEAR MEDICINE PET SKULL BASE TO THIGH
TECHNIQUE: 5.9 mCi Ga 68 DOTATATE was injected intravenously. Full-ring PET
imaging was performed from the skull base to thigh after the
radiotracer. CT data was obtained and used for attenuation
correction and anatomic localization.

[Series 3: pet sk_thigh ac · axial · 5.0mm · 4.07mm/px · z∈[-1080,-132]mm · 6 of 238 slices shown]
[im 1/238]
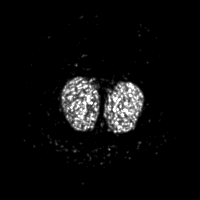
[im 48/238]
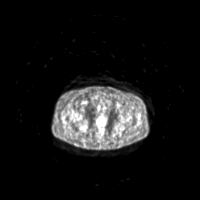
[im 95/238]
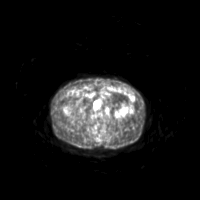
[im 143/238]
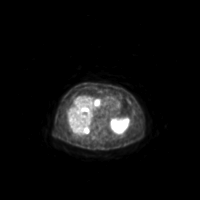
[im 190/238]
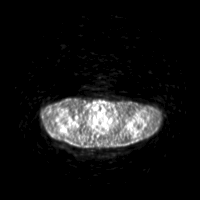
[im 238/238]
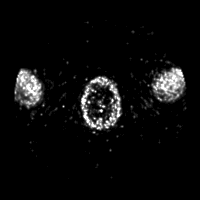

[Series 4: ct sk_thigh 5.0 b31f · axial · 5.0mm · 0.98mm/px · z∈[-1080,-132]mm · 5 of 238 slices shown]
[im 1/238]
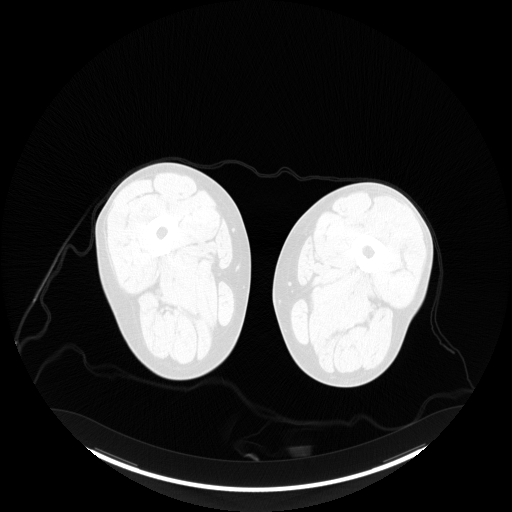
[im 60/238]
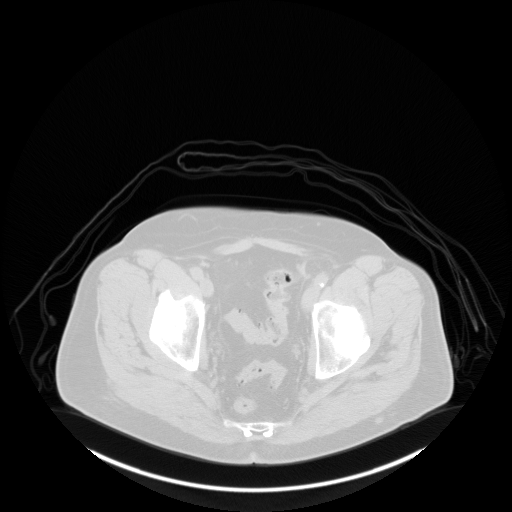
[im 119/238]
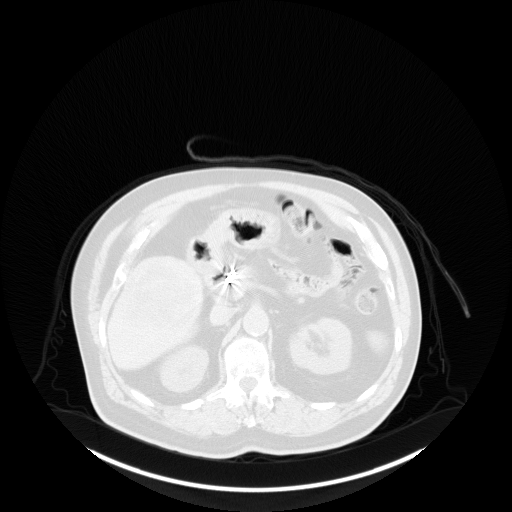
[im 178/238]
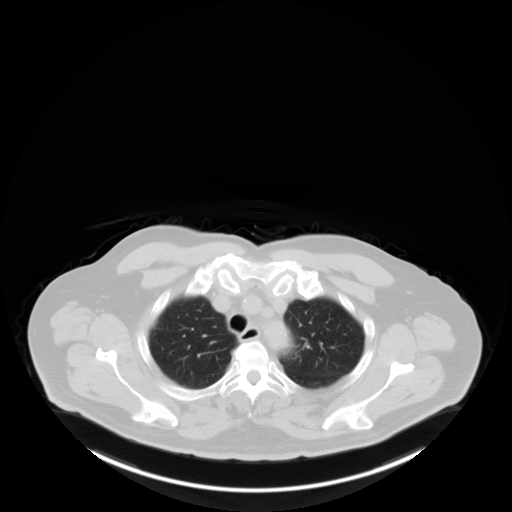
[im 238/238  brain]
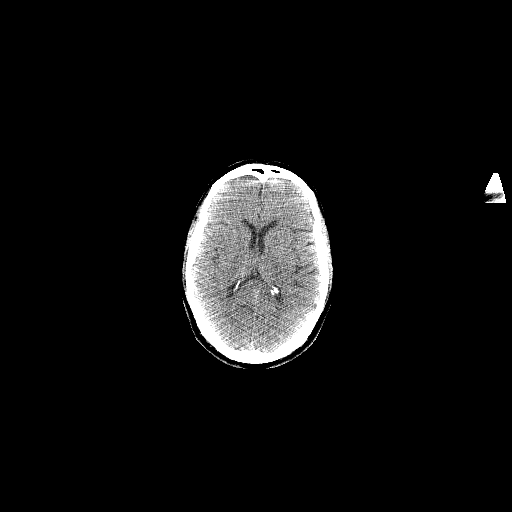

[Series 5: pet sk_thigh nac · axial · 5.0mm · 4.07mm/px · z∈[-1080,-132]mm · 5 of 238 slices shown]
[im 1/238]
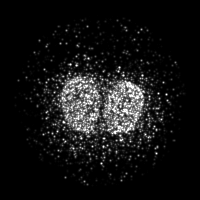
[im 60/238]
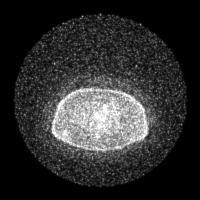
[im 119/238]
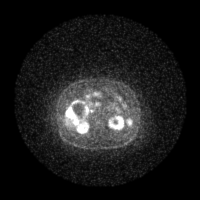
[im 178/238]
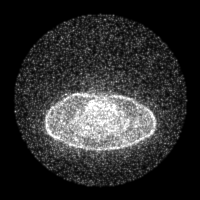
[im 238/238]
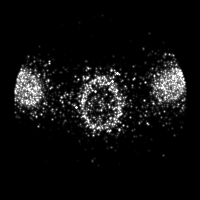

[Series 8: ct sk_thigh 5.0 b70f (id)_bone · axial · 5.0mm · 0.67mm/px · 1 of 65 slices shown]
[im 1/65  bone]
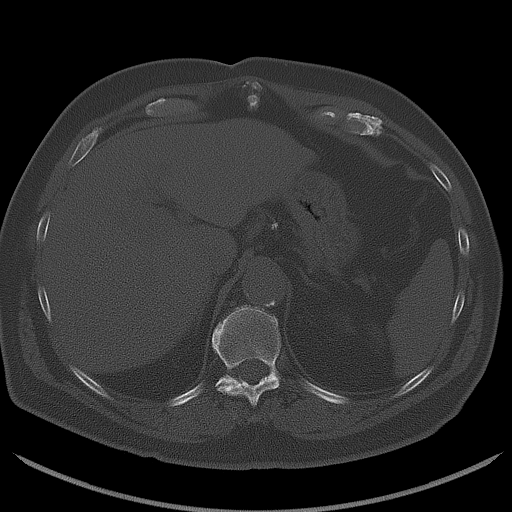

[Series 606: range-ct sk_thigh 5.0 (id)<alpha range(1)> · 1 of 61 slices shown]
[im 1/61]
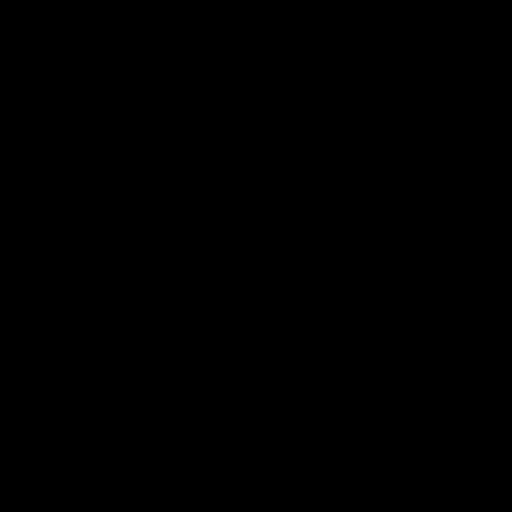

[Series 607: mip range 3 · coronal · 1.97mm/px · 1 of 32 slices shown]
[im 1/32]
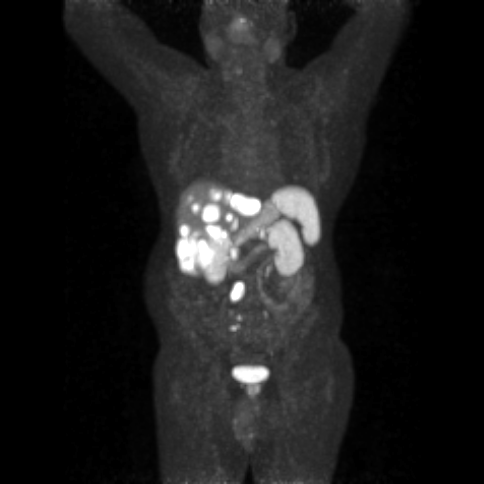

[Series 608: range-ct sk_thigh 5.0 (id)<alpha range> · 5 of 222 slices shown]
[im 1/222]
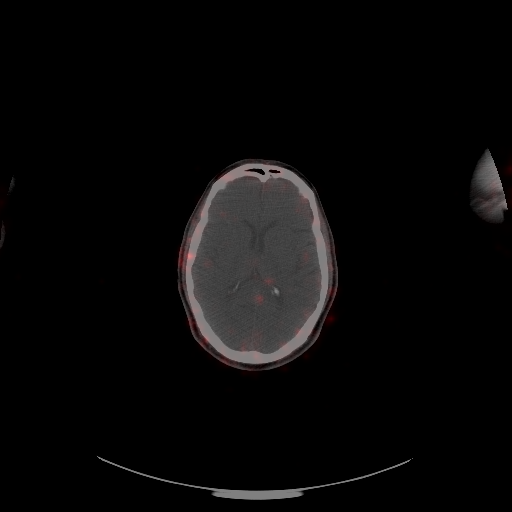
[im 56/222]
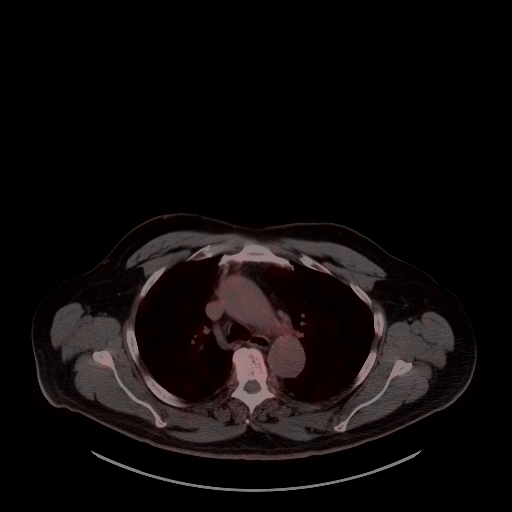
[im 111/222]
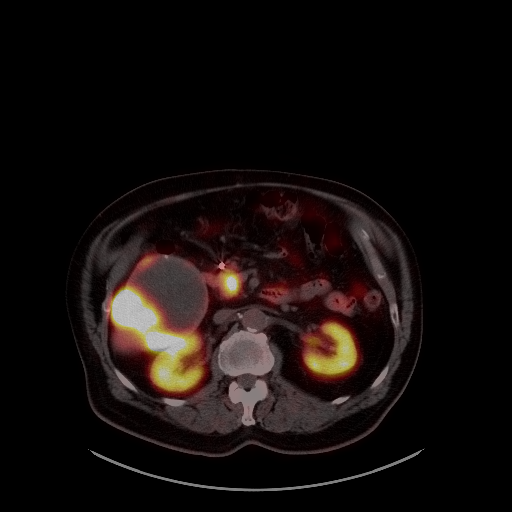
[im 166/222]
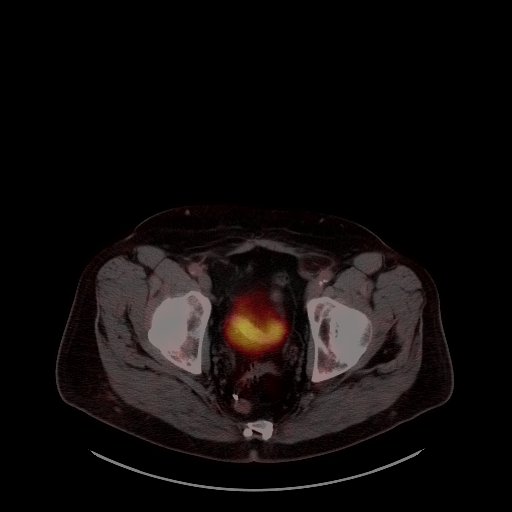
[im 222/222]
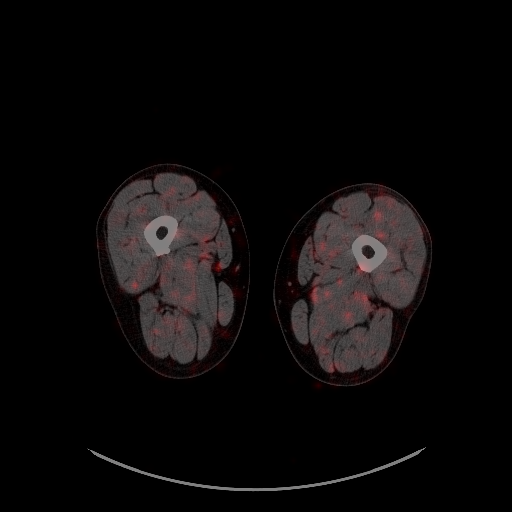

[Series 1168: results mm oncology reading · 1.0mm · 1.19mm/px · 1 of 8 slices shown]
[im 1/8]
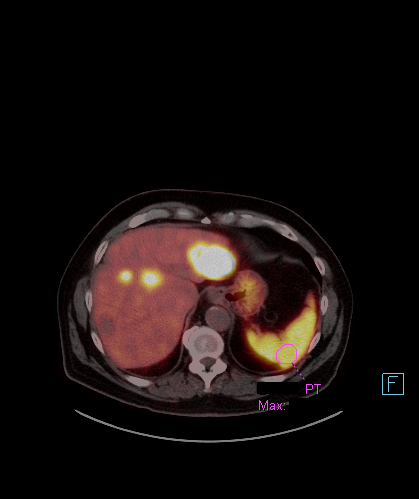

[25 of 25 positions shown; findings below may reference images not displayed]

FINDINGS: NECK

No radiotracer activity in neck lymph nodes.

Incidental CT findings: None

CHEST

No radiotracer accumulation within mediastinal or hilar lymph nodes.
No suspicious pulmonary nodules on the CT scan.

Incidental CT finding:Coronary artery calcification and aortic
atherosclerotic calcification.

ABDOMEN/PELVIS

Again demonstrated rounded lesions within LEFT and RIGHT hepatic
lobe with intense avidity for the neuroendocrine tumor specific
radiotracer. The activity remains intense however is slightly
reduced from comparison pre therapy DOTATATE PET-CT scan [DATE].

For example lesion LEFT lateral hepatic lobe SUV max equal 46
compared SUV max equal 52. On CT imaging poor defined lesion
measures 3.7 x 4.5 cm compared to 3.3 5.0 cm visually lesion appears
slightly larger. Lesion in the central LEFT hepatic lobe is also
decreased in metabolic activity with SUV max equal 34 compared SUV
max equal 39. This lesion is also slightly larger measuring 2.6 by
2.9 cm compared with 2.7 by 2.2 cm.

Lastly largest lesion which partially cystic/necrotic extending from
the inferior margin of the RIGHT hepatic lobe with SUV max equal 38
decreased SUV max equal 48. The thickened solid portion of the
lesion posteriorly measures 3.1 cm compared to 3.9 cm.

No new lesions in the liver.

Within the central mesentery a small mass which partially calcified
with SUV max equal 52 comparison SUV max equal 47 for no reduction
in radiotracer activity. This lesion measures 22 mm maximally (image
144/4 compared with 24 mm on prior.

There is no new peritoneal lesions on this exam.

Along the serosal surface of the small bowel in the central abdomen
there is a focus of radiotracer activity with no clear lesion
identified. However this region was active on comparison exam is
concerning for a serosal metastasis.

Physiologic activity noted in the liver, spleen, adrenal glands and
kidneys.

Incidental CT findings:Prostate normal. Atherosclerotic
calcification of the aorta.

SKELETON

Focus of radiotracer uptake in the T12 vertebral body posterior
adjacent to the LEFT pedicle with SUV max 12.9 decreased from SUV
max

Incidental CT findings:None
IMPRESSION: 1. Intense radiotracer activity remains within the liver metastasis;
however, measurably decreased from comparison exam (10%). No
evidence of progression liver. Some lesions are slightly larger
which reported phenomena of radiotherapy.
2. Intense activity remains within the central mesenteric mass. No
new peritoneal disease.
3. Small probable serosal metastasis in the small bowel not changed
from comparison exam.
4. Solitary skeletal lesion in the T12 vertebral body is slightly
decreased radiotracer activity.
5. Of note peptide receptor radiotherapy with a Lu 177 DOTATATE has
been shown to have continue therapeutic effect upto 6 months
following therapy. Recommend repeat scan in 6-12 months.

## 2018-03-03 MED ORDER — GALLIUM GA 68 DOTATATE IV KIT
5.9000 | PACK | Freq: Once | INTRAVENOUS | Status: AC
  Administered 2018-03-03: 5.9 via INTRAVENOUS

## 2018-03-07 ENCOUNTER — Encounter (HOSPITAL_COMMUNITY)
Admission: RE | Admit: 2018-03-07 | Discharge: 2018-03-07 | Disposition: A | Discharge status: Home / Self Care | Payer: Medicare HMO | Source: Ambulatory Visit | Attending: Diagnostic Radiology | Admitting: Diagnostic Radiology

## 2018-03-07 ENCOUNTER — Encounter (HOSPITAL_COMMUNITY): Payer: Medicare HMO

## 2018-03-07 DIAGNOSIS — C7A8 Other malignant neuroendocrine tumors: Secondary | ICD-10-CM | POA: Insufficient documentation

## 2018-03-07 DIAGNOSIS — C7A1 Malignant poorly differentiated neuroendocrine tumors: Secondary | ICD-10-CM | POA: Diagnosis not present

## 2018-03-07 IMAGING — NM NM RADIOLOGIST EVAL AND
1 series · 1 of 1 positions shown · non-contrast
Comparison: none

[Series 1: static · 2.07mm/px · 1 of 1 slices shown]
[im 1/1]
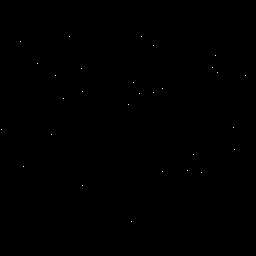

[1 of 1 positions shown; findings below may reference images not displayed]

EXAM:
Established patient.  Office visit follow-up

CHIEF COMPLAINT:
Current Pain Level: 1-10

Metastatic neuroendocrine tumor

HISTORY OF PRESENT ILLNESS:
Patient completed 4 cycles of peptide receptor radiotherapy (Lu 177
DOTATATE). Last therapy [DATE].

See [REDACTED] note for detail.

REVIEW OF SYSTEMS:
Patient reports decreased diarrhea.  No flushing.  Good energy

PHYSICAL EXAMINATION:
See [REDACTED]

ASSESSMENT AND PLAN:
Patient completed 4 cycles of peptide receptor radiotherapy (Lu 177
DOTATATE). Last therapy [DATE]. Patient reports a positive
subjective response to therapy with decrease in diarrhea and no
flushing. Patient has a modest response DOTATATE PET imaging 1 month
following therapy with proximally 10% decrease in activity lesions
the liver. Several small lesions are identified in the bowel and
single lesion the skeleton which will be followed closely. No
evidence disease progression. Chromogranin remains low 10. Note
radiotherapy peptide receptor radiotherapy can have positive FX
further following 6-12 months after completion of therapy. Recommend
follow-up imaging in 6-9 months or sooner if symptoms worsen. It has
been a pleasure treating Mr. DEMIQUEL and thank you for the referel.

## 2018-03-07 NOTE — Consult Note (Signed)
Chief Complaint: Patient with metastatic neuroendocrine tumor was for  evaluation Peptide receptor radiotherapy (PRRT) with MW413 DOTATATE (Lutathera).  Referring Physician(s):  Ennerver  Patient Status: [facility] - Out-pt  History of Present Illness: Brian Mays is a 72 y.o. male with Initial presentation of neuroendocrine tumor in 2010 with central mesenteric lesion and liver metastasis.  Patient has received 3 treatments with yttrium 90 tumor directed radiotherapy to the liver from 2011 to 2015 as well as liver ablation.  Patient maintained on monthly long acting Sandostatin therapy (Samatuline).  The patient has relatively stable disease and presents for peptide receptor radiotherapy.  Patient's tumor is avid for the somatostatin avid radiotrace as demonstrated on Ga 68 DOTATATE PET scan of 03/17/2017 which demonstrated uptake in multiple liver lesions and within the central mesenteric mass.     Patient completed 4 cycles of peptide receptor radiotherapy (Lu 177 DOTATATE).] Last therapy04/12/2017.]    Past Medical History:  Diagnosis Date  . Anemia    in past  . Colon polyps   . Diverticulosis   . Gallstones   . GERD (gastroesophageal reflux disease)   . Heart attack ([place])    mild, Spring 2017  . Heart murmur   . Hemorrhoid   . Hiatal hernia   . HTN (hypertension)   . Hypothyroidism   . Metastatic carcinoma ([place]) 2010   Dr Jonette Eva  . Pancreatitis 1998   chronic    Past Surgical History:  Procedure Laterality Date  . APPENDECTOMY  1962  . CARDIAC CATHETERIZATION N/A 02/10/2016   Procedure: Left Heart Cath and Coronary Angiography;  Surgeon: Adrian Prows, MD;  Location: [place] [facility];  Service: Cardiovascular;  Laterality: N/A;  . CARDIAC CATHETERIZATION  02/10/2016   Procedure: Coronary/Graft Atherectomy;  Surgeon: Adrian Prows, MD;  Location: [place] [facility];  Service: Cardiovascular;;  . CARDIAC CATHETERIZATION  02/10/2016   Procedure: Coronary Stent Intervention;   Surgeon: Adrian Prows, MD;  Location: [place] [facility];  Service: Cardiovascular;;  . CATARACT EXTRACTION W/ INTRAOCULAR LENS  IMPLANT, BILATERAL Bilateral   . IR GENERIC HISTORICAL  12/30/2015   IR RADIOLOGIST EVAL & MGMT 12/30/2015 Aletta Edouard, MD [facility] INTERV RAD  . IR GENERIC HISTORICAL  11/09/2016   IR RADIOLOGIST EVAL & MGMT 11/09/2016 Aletta Edouard, MD [facility] INTERV RAD  . IR RADIOLOGIST EVAL & MGMT  03/01/2017  . IR RADIOLOGIST EVAL & MGMT  06/29/2017  . LAPAROSCOPIC CHOLECYSTECTOMY  1999  . LIVER BIOPSY  2010  . RADIOACTIVE SEED IMPLANT  X 3   "to my liver"  . TUMOR EXCISION  01/2009   Carcinoil Resection   . TUMOR REMOVAL     from small intestine    Allergies: Iohexol  Medications: Prior to Admission medications   Medication Sig Start Date End Date Taking? Authorizing Provider  amLODipine (NORVASC) 5 MG tablet Take 5 mg by mouth daily. 03/16/17   [provider]  aspirin EC 81 MG tablet Take 81 mg by mouth every morning.    [provider]  atorvastatin (LIPITOR) 20 MG tablet Take 20 mg daily by mouth.    [provider]  Cholecalciferol 1000 UNITS tablet Take 1,000 Units by mouth daily.      [provider]  clopidogrel (PLAVIX) 75 MG tablet Take 1 tablet (75 mg total) by mouth daily. 10/13/17   Nahser, Wonda Cheng, MD  fluticasone (FLONASE) 50 MCG/ACT nasal spray Place 2 sprays into both nostrils daily as needed (sinuses).  01/06/15  [provider]  hydrocortisone (ANUSOL-HC) 25 MG suppository INSERT 1 SUPPOSITORY RECTALLY TWICE A DAY AS NEEDED FOR HEMORRHOIDS 02/07/15   [place], Holli Humbles, NP  Lanreotide Acetate (SOMATULINE DEPOT [place]) Inject 120 mcg into the skin every 28 (twenty-eight) days. Receives at Dr Antonieta Pert office    [provider]  lansoprazole (PREVACID) 30 MG capsule Take 1 capsule (30 mg total) by mouth daily at 12 noon. 11/19/16   Copland, Gay Filler, MD  levothyroxine (SYNTHROID, LEVOTHROID) 50 MCG tablet Take 1  tablet (50 mcg total) by mouth daily. 08/09/17   Copland, Gay Filler, MD  metoprolol tartrate (LOPRESSOR) 25 MG tablet Take 0.5 tablets (12.5 mg total) 2 (two) times daily by mouth. 09/14/17   Nahser, Wonda Cheng, MD  nitroGLYCERIN (NITROSTAT) 0.4 MG SL tablet Place 1 tablet (0.4 mg total) under the tongue every 5 (five) minutes x 3 doses as needed for chest pain. 02/11/16   Adrian Prows, MD  Omega-3 Fatty Acids (FISH OIL) 1000 MG CAPS Take 1 capsule by mouth daily. Reported on 12/30/2015    [provider]  sodium chloride (OCEAN) 0.65 % SOLN nasal spray Place 1 spray into both nostrils as needed for congestion.     [provider]  vitamin B-12 (CYANOCOBALAMIN) 1000 MCG tablet Take 1,000 mcg by mouth daily.    [provider]     Family History  Problem Relation Age of Onset  . Kidney disease Mother   . Hyperlipidemia Mother   . Hypertension Mother   . COPD Father   . Ulcerative colitis Daughter     Social History   Socioeconomic History  . Marital status: Married    Spouse name: Not on file  . Number of children: 1  . Years of education: Not on file  . Highest education level: Not on file  Occupational History  . 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  Social Needs  . Financial resource strain: Not on file  . Food insecurity:    Worry: Not on file    Inability: Not on file  . Transportation needs:    Medical: Not on file    Non-medical: Not on file  Tobacco Use  . Smoking status: Former Smoker    Packs/day: 1.50    Years: 35.00    Pack years: 52.50    Types: Cigarettes    Start date: 09/25/1959    Last attempt to quit: 12/21/1997    Years since quitting: 20.2  . Smokeless tobacco: Never Used  Substance and Sexual Activity  . Alcohol use: Yes    Alcohol/week: 1.8 - 2.4 oz    Types: 3 - 4 Cans of beer per week    Comment: 4 beers weekly   . Drug use: No  . Sexual activity: Yes  Lifestyle  . Physical activity:    Days per week:  Not on file    Minutes per session: Not on file  . Stress: Not on file  Relationships  . Social connections:    Talks on phone: Not on file    Gets together: Not on file    Attends religious service: Not on file    Active member of club or organization: Not on file    Attends meetings of clubs or organizations: Not on file    Relationship status: Not on file  Other Topics Concern  . Not on file  Social History Narrative   Regular Exercise -  NO    ECOG  Status: 1 - Symptomatic but completely ambulatory  Review of Systems: A 12 point ROS discussed and pertinent positives are indicated in the HPI above.  All other systems are negative.  Review of Systems  Gastrointestinal: Negative for diarrhea.       Some bleching  Endocrine:       NO flushing reported  Skin:       Bumps on scalp which itch.    Vital Signs: There were no vitals taken for this visit.  Physical Exam  Constitutional: He appears well-developed.    Imaging: Nm Pet (netspot Ga 68 Dotatate) Skull Base To Mid Thigh  Result Date: 03/03/2018 CLINICAL DATA:  Subsequent treatment strategy for well differentiated neuroendocrine tumor. Patient status post 4 of 4 Mays of peptide receptor radiotherapy with Lutathera (Lu 177 DOTATATE). EXAM: NUCLEAR MEDICINE PET SKULL BASE TO THIGH TECHNIQUE: 5.9 mCi Ga 48 DOTATATE was injected intravenously. Full-ring PET imaging was performed from the skull base to thigh after the radiotracer. CT data was obtained and used for attenuation correction and anatomic localization. COMPARISON:  None. FINDINGS: NECK No radiotracer activity in neck lymph nodes. Incidental CT findings: None CHEST No radiotracer accumulation within mediastinal or hilar lymph nodes. No suspicious pulmonary nodules on the CT scan. Incidental CT finding:Coronary artery calcification and aortic atherosclerotic calcification. ABDOMEN/PELVIS Again demonstrated rounded lesions within LEFT and RIGHT hepatic lobe with intense  avidity for the neuroendocrine tumor specific radiotracer. The activity remains intense however is slightly reduced from comparison pre therapy DOTATATE PET-CT scan 03/17/2017. For example lesion LEFT lateral hepatic lobe SUV max equal 46 compared SUV max equal 52. On CT imaging poor defined lesion measures 3.7 x 4.5 cm compared to 3.3 5.0 cm visually lesion appears slightly larger. Lesion in the central LEFT hepatic lobe is also decreased in metabolic activity with SUV max equal 34 compared SUV max equal 39. This lesion is also slightly larger measuring 2.6 by 2.9 cm compared with 2.7 by 2.2 cm. Lastly largest lesion which partially cystic/necrotic extending from the inferior margin of the RIGHT hepatic lobe with SUV max equal 38 decreased SUV max equal 48. The thickened solid portion of the lesion posteriorly measures 3.1 cm compared to 3.9 cm. No new lesions in the liver. Within the central mesentery a small mass which partially calcified with SUV max equal 52 comparison SUV max equal 47 for no reduction in radiotracer activity. This lesion measures 22 mm maximally (image 144/4 compared with 24 mm on prior. There is no new peritoneal lesions on this exam. Along the serosal surface of the small bowel in the central abdomen there is a focus of radiotracer activity with no clear lesion identified. However this region was active on comparison exam is concerning for a serosal metastasis. Physiologic activity noted in the liver, spleen, adrenal glands and kidneys. Incidental CT findings:Prostate normal. Atherosclerotic calcification of the aorta. SKELETON Focus of radiotracer uptake in the T12 vertebral body posterior adjacent to the LEFT pedicle with SUV max 12.9 decreased from SUV max 15.4 Incidental CT findings:None IMPRESSION: 1. Intense radiotracer activity remains within the liver metastasis; however, measurably decreased from comparison exam (10%). No evidence of progression liver. Some lesions are slightly  larger which reported phenomena of radiotherapy. 2. Intense activity remains within the central mesenteric mass. No new peritoneal disease. 3. Small probable serosal metastasis in the small bowel not changed from comparison exam. 4. Solitary skeletal lesion in the T12 vertebral body is slightly decreased radiotracer activity. 5. Of note peptide receptor  radiotherapy with a Lu 177 DOTATATE has been shown to have continue therapeutic effect upto 6 months following therapy. Recommend repeat scan in 6-12 months. Electronically Signed   By: Suzy Bouchard M.D.   On: 03/03/2018 16:07   Nm Pet (netspot Ga 68 Dotatate) Skull Base To Mid Thigh  Result Date: 03/17/2017 CLINICAL DATA:  Neuroendocrine tumor. Metastatic low-grade neruoendocrine tumor with hepatic metastasis. EXAM: NUCLEAR MEDICINE PET SKULL BASE TO THIGH TECHNIQUE: 4.8 mCi Ga 57 DOTATATE was injected intravenously. Full-ring PET imaging was performed from the skull base to thigh after the radiotracer. CT data was obtained and used for attenuation correction and anatomic localization. COMPARISON:  CT 02/24/2017, PET-CT 05/20/2011 FINDINGS: NECK No radiotracer activity in neck lymph nodes. CHEST No radiotracer accumulation within mediastinal or hilar lymph nodes. No suspicious pulmonary nodules on the CT scan. ABDOMEN/PELVIS Multiple lesions in the liver have intense radiotracer accumulation. The largest lesion in the inferior aspect the RIGHT hepatic lobe measuring 9.4 x 8.5 cm has intense peripheral activity with SUV max equal 36.4 The second largest lesion is in the LEFT lateral hepatic lobe measuring 5 cm with SUV max 52. There are 9 additional smaller lesions within the LEFT RIGHT hepatic lobe. Hypermetabolic activity associated with the uncinate of the pancreas is typical physiologic location. However there is a small porta hepatis lymph node adjacent to the uncinate which appears to accumulate radiotracer (image 116, series 4). Intense activity  associated with the partially calcified central mesenteric mass with SUV max equal 46. No additional metastasis in the abdomen or pelvis. Physiologic activity noted in the liver, spleen, adrenal glands and kidneys. SKELETON No skeletal metastasis per IMPRESSION: 1. Multiple sites of intense radiotracer accumulation within the LEFT and RIGHT hepatic lobe consistent with well differentiated neuroendocrine tumor. 2. Intense metabolic activity associated with the central mesenteric partially calcified mass consistent with neuroendocrine tumor. 3. Activity in the uncinate of the pancreas is favored physiologic. Probable adjacent metastatic node in the porta hepatis. Electronically Signed   By: Suzy Bouchard M.D.   On: 03/17/2017 10:00    Labs:  CBC: Recent Labs    11/07/17 1128 12/14/17 0757 01/11/18 0748 02/22/18 0740  WBC 6.9 3.9* 4.5 3.8*  HGB 11.6* 11.7* 11.8* 11.4*  HCT 35.3* 35.8* 36.4* 35.1*  PLT 127* 133* 130* 123*    COAGS: No results for input(s): INR, APTT in the last 8760 hours.  BMP: Recent Labs    08/25/17 1000  11/07/17 1128 11/29/17 0735 12/14/17 0757 01/11/18 0748 02/22/18 0740  NA 137   < > 140 140 142 149* 147*  K 4.5   < > 4.1 4.2 4.0 4.2 4.8*  CL 104   < > 105 101 105 106 105  CO2 30   < > 28 25 25  32 33  GLUCOSE 75   < > 121 110* 133 157* 120*  BUN 15   < > 12 11 11 11 12   CALCIUM 8.6*   < > 9.1 8.9 9.2 9.6 9.4  CREATININE 1.09   < > 0.94 0.98 0.95 1.00 0.80  GFRNONAA >60  --  >60 78 >60  --   --   GFRAA >60  --  >60 90 >60  --   --    < > = values in this interval not displayed.    LIVER FUNCTION TESTS: Recent Labs    11/29/17 0735 12/14/17 0757 01/11/18 0748 02/22/18 0740  BILITOT 0.5 0.7 0.9 0.7  AST 29 28 31  28  ALT 17 14 27 25   ALKPHOS 65 68 69 60  PROT 6.7 7.3 7.8 7.2  ALBUMIN 3.9 3.5 3.3* 3.2*    TUMOR MARKERS: Recent Labs    06/24/17 1119 07/25/17 1109 09/05/17 1015 10/04/17 0904  CHROMOGRNA 10* 10* 16* 12*    Assessment  and Plan:  [Patient completed 4 cycles of peptide receptor radiotherapy (Lu 177 DOTATATE).] Last therapy04/12/2017.] Patient reports a positive subjective response to therapy with decrease in diarrhea and no flushing.   Patient has a modest response by DOTATATE PET imaging 1 month following therapy with proximally 10% decrease in activity lesions the liver.  Several small lesions are identified in the bowel and single lesion the skeleton which will be followed closely.  No evidence disease progression.  Chromogranin remains low at 10.   Recommend continued somatostatin injections for the near future.    Note radiotherapy peptide receptor radiotherapy can have continued measurable positive effects following 6-12 months after completion of therapy.  Recommend follow-up imaging in 6-9 months or sooner if symptoms worsen.It has been a pleasure treatingMr. Mays and thank you for the referel.    Thank you for this interesting consult.  I greatly enjoyed meeting Brian Mays and look forward to participating in their care.  A copy of this report was sent to the requesting provider on this date.  Electronically Signed: Rennis Golden, MD 03/07/2018, 2:56 PM   I spent a total of   15 Minutes in face to face in clinical consultation, greater than 50% of which was counseling/coordinating care for metastatic neuroendocrine tumor.

## 2018-03-28 ENCOUNTER — Other Ambulatory Visit: Payer: Self-pay | Admitting: Family Medicine

## 2018-03-29 ENCOUNTER — Inpatient Hospital Stay: Payer: Medicare HMO | Attending: Hematology & Oncology | Admitting: Hematology & Oncology

## 2018-03-29 ENCOUNTER — Inpatient Hospital Stay: Payer: Medicare HMO

## 2018-04-06 ENCOUNTER — Inpatient Hospital Stay: Payer: Medicare HMO | Attending: Hematology & Oncology

## 2018-04-06 ENCOUNTER — Encounter: Payer: Self-pay | Admitting: Hematology & Oncology

## 2018-04-06 ENCOUNTER — Inpatient Hospital Stay: Payer: Medicare HMO

## 2018-04-06 ENCOUNTER — Inpatient Hospital Stay (HOSPITAL_BASED_OUTPATIENT_CLINIC_OR_DEPARTMENT_OTHER): Payer: Medicare HMO | Admitting: Hematology & Oncology

## 2018-04-06 ENCOUNTER — Other Ambulatory Visit: Payer: Self-pay

## 2018-04-06 VITALS — BP 142/68 | HR 70 | Temp 98.4°F | Resp 16 | Wt 206.0 lb

## 2018-04-06 DIAGNOSIS — C7B8 Other secondary neuroendocrine tumors: Secondary | ICD-10-CM

## 2018-04-06 DIAGNOSIS — E340 Carcinoid syndrome: Secondary | ICD-10-CM | POA: Diagnosis not present

## 2018-04-06 DIAGNOSIS — Z7982 Long term (current) use of aspirin: Secondary | ICD-10-CM | POA: Insufficient documentation

## 2018-04-06 DIAGNOSIS — C7A8 Other malignant neuroendocrine tumors: Secondary | ICD-10-CM

## 2018-04-06 DIAGNOSIS — Z79899 Other long term (current) drug therapy: Secondary | ICD-10-CM | POA: Diagnosis not present

## 2018-04-06 LAB — CBC WITH DIFFERENTIAL (CANCER CENTER ONLY)
Basophils Absolute: 0 10*3/uL (ref 0.0–0.1)
Basophils Relative: 1 %
EOS ABS: 0.2 10*3/uL (ref 0.0–0.5)
Eosinophils Relative: 4 %
HCT: 33 % — ABNORMAL LOW (ref 38.7–49.9)
HEMOGLOBIN: 10.8 g/dL — AB (ref 13.0–17.1)
LYMPHS ABS: 0.5 10*3/uL — AB (ref 0.9–3.3)
LYMPHS PCT: 15 %
MCH: 29 pg (ref 28.0–33.4)
MCHC: 32.7 g/dL (ref 32.0–35.9)
MCV: 88.5 fL (ref 82.0–98.0)
Monocytes Absolute: 0.3 10*3/uL (ref 0.1–0.9)
Monocytes Relative: 8 %
NEUTROS PCT: 72 %
Neutro Abs: 2.4 10*3/uL (ref 1.5–6.5)
Platelet Count: 124 10*3/uL — ABNORMAL LOW (ref 145–400)
RBC: 3.73 MIL/uL — AB (ref 4.20–5.70)
RDW: 16.1 % — ABNORMAL HIGH (ref 11.1–15.7)
WBC: 3.4 10*3/uL — AB (ref 4.0–10.0)

## 2018-04-06 LAB — CMP (CANCER CENTER ONLY)
ALBUMIN: 3.1 g/dL — AB (ref 3.5–5.0)
ALK PHOS: 67 U/L (ref 26–84)
ALT: 20 U/L (ref 10–47)
AST: 36 U/L (ref 11–38)
Anion gap: 11 (ref 5–15)
BUN: 13 mg/dL (ref 7–22)
CALCIUM: 9 mg/dL (ref 8.0–10.3)
CO2: 28 mmol/L (ref 18–33)
Chloride: 107 mmol/L (ref 98–108)
Creatinine: 0.8 mg/dL (ref 0.60–1.20)
Glucose, Bld: 124 mg/dL — ABNORMAL HIGH (ref 73–118)
POTASSIUM: 3.7 mmol/L (ref 3.3–4.7)
SODIUM: 146 mmol/L — AB (ref 128–145)
TOTAL PROTEIN: 7.2 g/dL (ref 6.4–8.1)
Total Bilirubin: 0.7 mg/dL (ref 0.2–1.6)

## 2018-04-06 MED ORDER — LANREOTIDE ACETATE 120 MG/0.5ML ~~LOC~~ SOLN
120.0000 mg | Freq: Once | SUBCUTANEOUS | Status: AC
  Administered 2018-04-06: 120 mg via SUBCUTANEOUS

## 2018-04-06 NOTE — Progress Notes (Signed)
Hematology and Oncology Follow Up Visit  Brian Mays [id number] Sep 05, 1946 72 y.o. 09/05/2017   Principle Diagnosis:  Metastatic low grade neuroendocrine tumor-hepatic metastases  Current Therapy:   S/p yttrium-90 intrahepatic therapy - November 2016 Somatuline 120 mg monthly Lutathera (Lu 177) injection on 02/01/2018 - s/p cycle #4    Interim History: Brian Mays is back for follow-up.  So far, he is doing okay.  He had a Lutathera PET scan done about a month ago.  This still show some activity but it was less.  He had decreased in activity by 10%.  There is no evidence of progressive disease.  He had the mesenteric lesion that was active.  I suspect that the Ephriam Knuckles is still working.  I suspect that it is still in his system.  As such, he will have another Lutathera PET scan in 5 months.  He is working.  He is not working as much.  His wife just got back from [place].  Her mother lives there.  She came back with pneumonia.  He is going to take the family to [place] next year.  He is playing golf.  Hopefully, he will be able to play Father's Day weekend.  There is been no rashes.  Has had no leg swelling.  He has had no change in bowel or bladder habits.  He says that he is a little bit constipated.  Overall, his performance status is ECOG 0.  Medications:  Allergies as of 09/05/2017      Reactions   Iohexol Hives    Code: HIVES, Desc: PER [name] @ [facility], PT IS ALLERGIC TO CONTRAST DYE 10/02/08/RM  05/01/10...needs full premeds per our protocol w/ [facility]., Onset Date: [id number]      Medication List        Accurate as of 09/05/17 11:45 AM. Always use your most recent med list.          amLODipine 5 MG tablet Commonly known as:  NORVASC Take 5 mg by mouth daily.   aspirin EC 81 MG tablet Take 81 mg by mouth every morning.   atorvastatin 20 MG tablet Commonly known as:  LIPITOR Take 20 mg daily by mouth.   Cholecalciferol 1000 units tablet Take 1,000  Units by mouth daily.   diphenhydrAMINE 50 MG tablet Commonly known as:  BENADRYL Take 1 tablet (50 mg total) by mouth once. Take 1 hour prior to scan.   Fish Oil 1000 MG Caps Take 1 capsule by mouth daily. Reported on 12/30/2015   fluticasone 50 MCG/ACT nasal spray Commonly known as:  FLONASE Place 2 sprays into both nostrils daily as needed (sinuses).   hydrocortisone 25 MG suppository Commonly known as:  ANUSOL-HC INSERT 1 SUPPOSITORY RECTALLY TWICE A DAY AS NEEDED FOR HEMORRHOIDS   lansoprazole 30 MG capsule Commonly known as:  PREVACID Take 1 capsule (30 mg total) by mouth daily at 12 noon.   levothyroxine 50 MCG tablet Commonly known as:  SYNTHROID, LEVOTHROID Take 1 tablet (50 mcg total) by mouth daily.   losartan 100 MG tablet Commonly known as:  COZAAR TAKE 1 TABLET (100 MG TOTAL) BY MOUTH AT BEDTIME.   metoprolol tartrate 25 MG tablet Commonly known as:  LOPRESSOR Take 1 tablet (25 mg total) by mouth 2 (two) times daily.   naproxen 500 MG tablet Commonly known as:  NAPROSYN Take 1 tablet (500 mg total) by mouth 2 (two) times daily with a meal.   nitroGLYCERIN 0.4 MG SL tablet Commonly  known as:  NITROSTAT Place 1 tablet (0.4 mg total) under the tongue every 5 (five) minutes x 3 doses as needed for chest pain.   predniSONE 50 MG tablet Commonly known as:  DELTASONE Take 50mg  at 13 hours, 7 hours, and 1 hour before scan   sodium chloride 0.65 % Soln nasal spray Commonly known as:  OCEAN Place 1 spray into both nostrils as needed for congestion.   SOMATULINE DEPOT [place] Inject 120 mcg into the skin every 28 (twenty-eight) days. Receives at Dr Antonieta Pert office   ticagrelor 90 MG Tabs tablet Commonly known as:  BRILINTA Take 1 tablet (90 mg total) by mouth 2 (two) times daily.   tiZANidine 4 MG tablet Commonly known as:  ZANAFLEX Take 1 tablet (4 mg total) by mouth every 6 (six) hours as needed for muscle spasms.   vitamin B-12 1000 MCG tablet Commonly  known as:  CYANOCOBALAMIN Take 1,000 mcg by mouth daily.       Allergies:  Allergies  Allergen Reactions  . Iohexol Hives     Code: HIVES, Desc: PER [name] @ [facility], PT IS ALLERGIC TO CONTRAST DYE 10/02/08/RM  05/01/10...needs full premeds per our protocol w/ [facility]., Onset Date: [id number]     Past Medical History, Surgical history, Social history, and Family History were reviewed and updated.  Review of Systems: Review of Systems  Constitutional: Negative for appetite change, fatigue, fever and unexpected weight change.  HENT:   Negative for lump/mass, mouth sores, sore throat and trouble swallowing.   Respiratory: Negative for cough, hemoptysis and shortness of breath.   Cardiovascular: Negative for leg swelling and palpitations.  Gastrointestinal: Negative for abdominal distention, abdominal pain, blood in stool, constipation, diarrhea, nausea and vomiting.  Genitourinary: Negative for bladder incontinence, dysuria, frequency and hematuria.   Musculoskeletal: Negative for arthralgias, back pain, gait problem and myalgias.  Skin: Negative for itching and rash.  Neurological: Negative for dizziness, extremity weakness, gait problem, headaches, numbness, seizures and speech difficulty.  Hematological: Does not bruise/bleed easily.  Psychiatric/Behavioral: Negative for depression and sleep disturbance. The patient is not nervous/anxious.      Physical Exam:  weight is 203 lb 4 oz (92.2 kg). His oral temperature is 98.3 F (36.8 C). His blood pressure is 113/63 and his pulse is 70. His respiration is 18 and oxygen saturation is 98%.   Wt Readings from Last 3 Encounters:  09/05/17 203 lb 4 oz (92.2 kg)  08/25/17 205 lb (93 kg)  07/25/17 205 lb (93 kg)    Physical Exam  Constitutional: He is oriented to person, place, and time.  HENT:  Head: Normocephalic and atraumatic.  Mouth/Throat: Oropharynx is clear and moist.  Eyes: Pupils are equal, round, and reactive to  light. EOM are normal.  Neck: Normal range of motion.  Cardiovascular: Normal rate, regular rhythm and normal heart sounds.  Pulmonary/Chest: Effort normal and breath sounds normal.  Abdominal: Soft. Bowel sounds are normal.  Musculoskeletal: Normal range of motion. He exhibits no edema, tenderness or deformity.  Lymphadenopathy:    He has no cervical adenopathy.  Neurological: He is alert and oriented to person, place, and time.  Skin: Skin is warm and dry. No rash noted. No erythema.  Psychiatric: He has a normal mood and affect. His behavior is normal. Judgment and thought content normal.  Vitals reviewed. .   Lab Results  Component Value Date   WBC 6.4 09/05/2017   HGB 12.9 (L) 09/05/2017   HCT 40.1 09/05/2017  MCV 83 09/05/2017   PLT 142 (L) 09/05/2017   No results found for: FERRITIN, IRON, TIBC, UIBC, IRONPCTSAT Lab Results  Component Value Date   RBC 4.81 09/05/2017   No results found for: KPAFRELGTCHN, LAMBDASER, KAPLAMBRATIO No results found for: IGGSERUM, IGA, IGMSERUM No results found for: Odetta Pink, SPEI   Chemistry      Component Value Date/Time   NA 145 09/05/2017 1015   NA 141 07/23/2016 1255   K 4.2 09/05/2017 1015   K 4.1 07/23/2016 1255   CL 105 09/05/2017 1015   CO2 29 09/05/2017 1015   CO2 27 07/23/2016 1255   BUN 16 09/05/2017 1015   BUN 13.8 07/23/2016 1255   CREATININE 1.1 09/05/2017 1015   CREATININE 1.0 07/23/2016 1255      Component Value Date/Time   CALCIUM 9.9 09/05/2017 1015   CALCIUM 9.2 07/23/2016 1255   ALKPHOS 66 09/05/2017 1015   ALKPHOS 77 07/23/2016 1255   AST 38 09/05/2017 1015   AST 23 07/23/2016 1255   ALT 31 09/05/2017 1015   ALT 13 07/23/2016 1255   BILITOT 1.00 09/05/2017 1015   BILITOT 1.13 07/23/2016 1255     Impression and Plan: Mr. Burget is a 72 year old white male. He has metastatic low-grade neuroendocrine carcinoma. He has hepatic metastases.  Right  now, I think everything is doing quite well.  He will get his Somatuline today.  We will plan to get him back in another 6 weeks.  We will then plan for another injection at that time.     Burney Gauze, MD  11/5/201811:45 AM

## 2018-04-06 NOTE — Patient Instructions (Signed)
Lanreotide injection What is this medicine? LANREOTIDE (lan REE oh tide) is used to reduce blood levels of growth hormone in patients with a condition called acromegaly. It also works to slow or stop tumor growth in patients with neuroendocrine tumors and treat carcinoid syndrome. This medicine may be used for other purposes; ask your health care provider or pharmacist if you have questions. COMMON BRAND NAME(S): Somatuline [place] What should I tell my health care provider before I take this medicine? They need to know if you have any of these conditions: -diabetes -gallbladder disease -heart disease -kidney disease -liver disease -thyroid disease -an unusual or allergic reaction to lanreotide, other medicines, foods, dyes, or preservatives -pregnant or trying to get pregnant -breast-feeding How should I use this medicine? This medicine is for injection under the skin. It is given by a health care professional in a hospital or clinic setting. Contact your pediatrician or health care professional regarding the use of this medicine in children. Special care may be needed. Overdosage: If you think you have taken too much of this medicine contact a poison control center or emergency room at once. NOTE: This medicine is only for you. Do not share this medicine with others. What if I miss a dose? It is important not to miss your dose. Call your doctor or health care professional if you are unable to keep an appointment. What may interact with this medicine? This medicine may interact with the following medications: -bromocriptine -cyclosporine -certain medicines for blood pressure, heart disease, irregular heart beat -certain medicines for diabetes -quinidine -terfenadine This list may not describe all possible interactions. Give your health care provider a list of all the medicines, herbs, non-prescription drugs, or dietary supplements you use. Also tell them if you smoke, drink alcohol, or  use illegal drugs. Some items may interact with your medicine. What should I watch for while using this medicine? Tell your doctor or healthcare professional if your symptoms do not start to get better or if they get worse. Visit your doctor or health care professional for regular checks on your progress. Your condition will be monitored carefully while you are receiving this medicine. You may need blood work done while you are taking this medicine. Women should inform their doctor if they wish to become pregnant or think they might be pregnant. There is a potential for serious side effects to an unborn child. Talk to your health care professional or pharmacist for more information. Do not breast-feed an infant while taking this medicine or for 6 months after stopping it. This medicine has caused ovarian failure in some women. This medicine may interfere with the ability to have a child. Talk with your doctor or health care professional if you are concerned about your fertility. What side effects may I notice from receiving this medicine? Side effects that you should report to your doctor or health care professional as soon as possible: -allergic reactions like skin rash, itching or hives, swelling of the face, lips, or tongue -increased blood pressure -severe stomach pain -signs and symptoms of high blood sugar such as dizziness; dry mouth; dry skin; fruity breath; nausea; stomach pain; increased hunger or thirst; increased urination -signs and symptoms of low blood sugar such as feeling anxious; confusion; dizziness; increased hunger; unusually weak or tired; sweating; shakiness; cold; irritable; headache; blurred vision; fast heartbeat; loss of consciousness -unusually slow heartbeat Side effects that usually do not require medical attention (report to your doctor or health care professional if they continue   or are bothersome): -constipation -diarrhea -dizziness -headache -muscle pain -muscle  spasms -nausea -pain, redness, or irritation at site where injected This list may not describe all possible side effects. Call your doctor for medical advice about side effects. You may report side effects to FDA at 1-800-FDA-1088. Where should I keep my medicine? This drug is given in a hospital or clinic and will not be stored at home. NOTE: This sheet is a summary. It may not cover all possible information. If you have questions about this medicine, talk to your doctor, pharmacist, or health care provider.  2018 Elsevier/Gold Standard (2016-07-23 10:33:47)  

## 2018-04-10 LAB — CHROMOGRANIN A: Chromogranin A: 19 nmol/L — ABNORMAL HIGH (ref 0–5)

## 2018-05-05 ENCOUNTER — Encounter: Payer: Self-pay | Admitting: Hematology & Oncology

## 2018-05-18 ENCOUNTER — Ambulatory Visit: Payer: Medicare HMO

## 2018-05-18 ENCOUNTER — Ambulatory Visit: Payer: Medicare HMO | Admitting: Hematology & Oncology

## 2018-05-18 ENCOUNTER — Other Ambulatory Visit: Payer: Medicare HMO

## 2018-05-19 ENCOUNTER — Inpatient Hospital Stay (HOSPITAL_BASED_OUTPATIENT_CLINIC_OR_DEPARTMENT_OTHER): Payer: Medicare HMO | Admitting: Hematology & Oncology

## 2018-05-19 ENCOUNTER — Other Ambulatory Visit: Payer: Self-pay

## 2018-05-19 ENCOUNTER — Inpatient Hospital Stay: Payer: Medicare HMO

## 2018-05-19 ENCOUNTER — Encounter: Payer: Self-pay | Admitting: Hematology & Oncology

## 2018-05-19 ENCOUNTER — Inpatient Hospital Stay: Payer: Medicare HMO | Attending: Hematology & Oncology

## 2018-05-19 VITALS — BP 141/59 | HR 66 | Temp 98.2°F | Resp 18 | Wt 204.0 lb

## 2018-05-19 DIAGNOSIS — C7A8 Other malignant neuroendocrine tumors: Secondary | ICD-10-CM

## 2018-05-19 DIAGNOSIS — Z7982 Long term (current) use of aspirin: Secondary | ICD-10-CM | POA: Insufficient documentation

## 2018-05-19 DIAGNOSIS — C7B8 Other secondary neuroendocrine tumors: Secondary | ICD-10-CM | POA: Diagnosis not present

## 2018-05-19 DIAGNOSIS — Z5111 Encounter for antineoplastic chemotherapy: Secondary | ICD-10-CM | POA: Diagnosis present

## 2018-05-19 LAB — CBC WITH DIFFERENTIAL (CANCER CENTER ONLY)
BASOS ABS: 0 10*3/uL (ref 0.0–0.1)
BASOS PCT: 0 %
Eosinophils Absolute: 0.2 10*3/uL (ref 0.0–0.5)
Eosinophils Relative: 6 %
HEMATOCRIT: 34.2 % — AB (ref 38.7–49.9)
HEMOGLOBIN: 10.9 g/dL — AB (ref 13.0–17.1)
LYMPHS PCT: 13 %
Lymphs Abs: 0.5 10*3/uL — ABNORMAL LOW (ref 0.9–3.3)
MCH: 29.4 pg (ref 28.0–33.4)
MCHC: 31.9 g/dL — ABNORMAL LOW (ref 32.0–35.9)
MCV: 92.2 fL (ref 82.0–98.0)
MONO ABS: 0.3 10*3/uL (ref 0.1–0.9)
Monocytes Relative: 7 %
NEUTROS ABS: 3.1 10*3/uL (ref 1.5–6.5)
NEUTROS PCT: 74 %
PLATELETS: 122 10*3/uL — AB (ref 145–400)
RBC: 3.71 MIL/uL — AB (ref 4.20–5.70)
RDW: 15.4 % (ref 11.1–15.7)
WBC: 4.2 10*3/uL (ref 4.0–10.0)

## 2018-05-19 LAB — CMP (CANCER CENTER ONLY)
ALK PHOS: 64 U/L (ref 26–84)
ALT: 18 U/L (ref 10–47)
ANION GAP: 9 (ref 5–15)
AST: 27 U/L (ref 11–38)
Albumin: 3.2 g/dL — ABNORMAL LOW (ref 3.5–5.0)
BILIRUBIN TOTAL: 0.8 mg/dL (ref 0.2–1.6)
BUN: 14 mg/dL (ref 7–22)
CHLORIDE: 104 mmol/L (ref 98–108)
CO2: 31 mmol/L (ref 18–33)
Calcium: 9 mg/dL (ref 8.0–10.3)
Creatinine: 0.8 mg/dL (ref 0.60–1.20)
Glucose, Bld: 146 mg/dL — ABNORMAL HIGH (ref 73–118)
POTASSIUM: 4 mmol/L (ref 3.3–4.7)
SODIUM: 144 mmol/L (ref 128–145)
TOTAL PROTEIN: 7.3 g/dL (ref 6.4–8.1)

## 2018-05-19 LAB — LACTATE DEHYDROGENASE: LDH: 192 U/L (ref 98–192)

## 2018-05-19 LAB — CEA (IN HOUSE-CHCC): CEA (CHCC-In House): <1 ng/mL (ref 0.00–5.00)

## 2018-05-19 MED ORDER — LANREOTIDE ACETATE 120 MG/0.5ML ~~LOC~~ SOLN
SUBCUTANEOUS | Status: AC
Start: 2018-05-19 — End: 2018-05-19
  Filled 2018-05-19: qty 120

## 2018-05-19 MED ORDER — LANREOTIDE ACETATE 120 MG/0.5ML ~~LOC~~ SOLN
120.0000 mg | Freq: Once | SUBCUTANEOUS | Status: AC
  Administered 2018-05-19: 120 mg via SUBCUTANEOUS

## 2018-05-19 NOTE — Patient Instructions (Signed)
Lanreotide injection What is this medicine? LANREOTIDE (lan REE oh tide) is used to reduce blood levels of growth hormone in patients with a condition called acromegaly. It also works to slow or stop tumor growth in patients with neuroendocrine tumors and treat carcinoid syndrome. This medicine may be used for other purposes; ask your health care provider or pharmacist if you have questions. COMMON BRAND NAME(S): Somatuline [place] What should I tell my health care provider before I take this medicine? They need to know if you have any of these conditions: -diabetes -gallbladder disease -heart disease -kidney disease -liver disease -thyroid disease -an unusual or allergic reaction to lanreotide, other medicines, foods, dyes, or preservatives -pregnant or trying to get pregnant -breast-feeding How should I use this medicine? This medicine is for injection under the skin. It is given by a health care professional in a hospital or clinic setting. Contact your pediatrician or health care professional regarding the use of this medicine in children. Special care may be needed. Overdosage: If you think you have taken too much of this medicine contact a poison control center or emergency room at once. NOTE: This medicine is only for you. Do not share this medicine with others. What if I miss a dose? It is important not to miss your dose. Call your doctor or health care professional if you are unable to keep an appointment. What may interact with this medicine? This medicine may interact with the following medications: -bromocriptine -cyclosporine -certain medicines for blood pressure, heart disease, irregular heart beat -certain medicines for diabetes -quinidine -terfenadine This list may not describe all possible interactions. Give your health care provider a list of all the medicines, herbs, non-prescription drugs, or dietary supplements you use. Also tell them if you smoke, drink alcohol, or  use illegal drugs. Some items may interact with your medicine. What should I watch for while using this medicine? Tell your doctor or healthcare professional if your symptoms do not start to get better or if they get worse. Visit your doctor or health care professional for regular checks on your progress. Your condition will be monitored carefully while you are receiving this medicine. You may need blood work done while you are taking this medicine. Women should inform their doctor if they wish to become pregnant or think they might be pregnant. There is a potential for serious side effects to an unborn child. Talk to your health care professional or pharmacist for more information. Do not breast-feed an infant while taking this medicine or for 6 months after stopping it. This medicine has caused ovarian failure in some women. This medicine may interfere with the ability to have a child. Talk with your doctor or health care professional if you are concerned about your fertility. What side effects may I notice from receiving this medicine? Side effects that you should report to your doctor or health care professional as soon as possible: -allergic reactions like skin rash, itching or hives, swelling of the face, lips, or tongue -increased blood pressure -severe stomach pain -signs and symptoms of high blood sugar such as dizziness; dry mouth; dry skin; fruity breath; nausea; stomach pain; increased hunger or thirst; increased urination -signs and symptoms of low blood sugar such as feeling anxious; confusion; dizziness; increased hunger; unusually weak or tired; sweating; shakiness; cold; irritable; headache; blurred vision; fast heartbeat; loss of consciousness -unusually slow heartbeat Side effects that usually do not require medical attention (report to your doctor or health care professional if they continue   or are bothersome): -constipation -diarrhea -dizziness -headache -muscle pain -muscle  spasms -nausea -pain, redness, or irritation at site where injected This list may not describe all possible side effects. Call your doctor for medical advice about side effects. You may report side effects to FDA at 1-800-FDA-1088. Where should I keep my medicine? This drug is given in a hospital or clinic and will not be stored at home. NOTE: This sheet is a summary. It may not cover all possible information. If you have questions about this medicine, talk to your doctor, pharmacist, or health care provider.  2018 Elsevier/Gold Standard (2016-07-23 10:33:47)  

## 2018-05-19 NOTE — Progress Notes (Signed)
Hematology and Oncology Follow Up Visit  Brian Mays [id number] 1946/02/17 72 y.o. 09/05/2017   Principle Diagnosis:  Metastatic low grade neuroendocrine tumor-hepatic metastases  Current Therapy:   S/p yttrium-90 intrahepatic therapy - November 2016 Somatuline 120 mg monthly Lutathera (Lu 177) injection on 02/01/2018 - s/p cycle #4    Interim History: Brian Mays is back for follow-up.  So far, he is doing okay.  He really has no specific complaints.  He says he feels so much better now that he has had his high blood pressure medicines readjusted.  He has had no issues with abdominal pain.  There is no diarrhea.  He has had no cough or shortness of breath.  He has had no leg swelling.  He has had no rashes.  He has had no fever.  He is trying to play some golf when he can.  The weather has been a little bit too high for him to do this.  His wife is having some problems with her back.    Overall, his performance status is ECOG 0.  Medications:  Allergies as of 09/05/2017      Reactions   Iohexol Hives    Code: HIVES, Desc: PER [name] @ [facility], PT IS ALLERGIC TO CONTRAST DYE 10/02/08/RM  05/01/10...needs full premeds per our protocol w/ [facility]., Onset Date: [id number]      Medication List        Accurate as of 09/05/17 11:45 AM. Always use your most recent med list.          amLODipine 5 MG tablet Commonly known as:  NORVASC Take 5 mg by mouth daily.   aspirin EC 81 MG tablet Take 81 mg by mouth every morning.   atorvastatin 20 MG tablet Commonly known as:  LIPITOR Take 20 mg daily by mouth.   Cholecalciferol 1000 units tablet Take 1,000 Units by mouth daily.   diphenhydrAMINE 50 MG tablet Commonly known as:  BENADRYL Take 1 tablet (50 mg total) by mouth once. Take 1 hour prior to scan.   Fish Oil 1000 MG Caps Take 1 capsule by mouth daily. Reported on 12/30/2015   fluticasone 50 MCG/ACT nasal spray Commonly known as:  FLONASE Place 2 sprays into both  nostrils daily as needed (sinuses).   hydrocortisone 25 MG suppository Commonly known as:  ANUSOL-HC INSERT 1 SUPPOSITORY RECTALLY TWICE A DAY AS NEEDED FOR HEMORRHOIDS   lansoprazole 30 MG capsule Commonly known as:  PREVACID Take 1 capsule (30 mg total) by mouth daily at 12 noon.   levothyroxine 50 MCG tablet Commonly known as:  SYNTHROID, LEVOTHROID Take 1 tablet (50 mcg total) by mouth daily.   losartan 100 MG tablet Commonly known as:  COZAAR TAKE 1 TABLET (100 MG TOTAL) BY MOUTH AT BEDTIME.   metoprolol tartrate 25 MG tablet Commonly known as:  LOPRESSOR Take 1 tablet (25 mg total) by mouth 2 (two) times daily.   naproxen 500 MG tablet Commonly known as:  NAPROSYN Take 1 tablet (500 mg total) by mouth 2 (two) times daily with a meal.   nitroGLYCERIN 0.4 MG SL tablet Commonly known as:  NITROSTAT Place 1 tablet (0.4 mg total) under the tongue every 5 (five) minutes x 3 doses as needed for chest pain.   predniSONE 50 MG tablet Commonly known as:  DELTASONE Take 50mg  at 13 hours, 7 hours, and 1 hour before scan   sodium chloride 0.65 % Soln nasal spray Commonly known as:  Ship broker  1 spray into both nostrils as needed for congestion.   SOMATULINE DEPOT [place] Inject 120 mcg into the skin every 28 (twenty-eight) days. Receives at Dr Antonieta Pert office   ticagrelor 90 MG Tabs tablet Commonly known as:  BRILINTA Take 1 tablet (90 mg total) by mouth 2 (two) times daily.   tiZANidine 4 MG tablet Commonly known as:  ZANAFLEX Take 1 tablet (4 mg total) by mouth every 6 (six) hours as needed for muscle spasms.   vitamin B-12 1000 MCG tablet Commonly known as:  CYANOCOBALAMIN Take 1,000 mcg by mouth daily.       Allergies:  Allergies  Allergen Reactions  . Iohexol Hives     Code: HIVES, Desc: PER [name] @ [facility], PT IS ALLERGIC TO CONTRAST DYE 10/02/08/RM  05/01/10...needs full premeds per our protocol w/ [facility]., Onset Date: [id number]     Past Medical  History, Surgical history, Social history, and Family History were reviewed and updated.  Review of Systems: Review of Systems  Constitutional: Negative for appetite change, fatigue, fever and unexpected weight change.  HENT:   Negative for lump/mass, mouth sores, sore throat and trouble swallowing.   Respiratory: Negative for cough, hemoptysis and shortness of breath.   Cardiovascular: Negative for leg swelling and palpitations.  Gastrointestinal: Negative for abdominal distention, abdominal pain, blood in stool, constipation, diarrhea, nausea and vomiting.  Genitourinary: Negative for bladder incontinence, dysuria, frequency and hematuria.   Musculoskeletal: Negative for arthralgias, back pain, gait problem and myalgias.  Skin: Negative for itching and rash.  Neurological: Negative for dizziness, extremity weakness, gait problem, headaches, numbness, seizures and speech difficulty.  Hematological: Does not bruise/bleed easily.  Psychiatric/Behavioral: Negative for depression and sleep disturbance. The patient is not nervous/anxious.      Physical Exam:  weight is 203 lb 4 oz (92.2 kg). His oral temperature is 98.3 F (36.8 C). His blood pressure is 113/63 and his pulse is 70. His respiration is 18 and oxygen saturation is 98%.   Wt Readings from Last 3 Encounters:  09/05/17 203 lb 4 oz (92.2 kg)  08/25/17 205 lb (93 kg)  07/25/17 205 lb (93 kg)    Physical Exam  Constitutional: He is oriented to person, place, and time.  HENT:  Head: Normocephalic and atraumatic.  Mouth/Throat: Oropharynx is clear and moist.  Eyes: Pupils are equal, round, and reactive to light. EOM are normal.  Neck: Normal range of motion.  Cardiovascular: Normal rate, regular rhythm and normal heart sounds.  Pulmonary/Chest: Effort normal and breath sounds normal.  Abdominal: Soft. Bowel sounds are normal.  Musculoskeletal: Normal range of motion. He exhibits no edema, tenderness or deformity.    Lymphadenopathy:    He has no cervical adenopathy.  Neurological: He is alert and oriented to person, place, and time.  Skin: Skin is warm and dry. No rash noted. No erythema.  Psychiatric: He has a normal mood and affect. His behavior is normal. Judgment and thought content normal.  Vitals reviewed. .   Lab Results  Component Value Date   WBC 6.4 09/05/2017   HGB 12.9 (L) 09/05/2017   HCT 40.1 09/05/2017   MCV 83 09/05/2017   PLT 142 (L) 09/05/2017   No results found for: FERRITIN, IRON, TIBC, UIBC, IRONPCTSAT Lab Results  Component Value Date   RBC 4.81 09/05/2017   No results found for: KPAFRELGTCHN, LAMBDASER, KAPLAMBRATIO No results found for: IGGSERUM, IGA, IGMSERUM No results found for: TOTALPROTELP, ALBUMINELP, A1GS, A2GS, BETS, BETA2SER, [place], MSPIKE, SPEI  Chemistry      Component Value Date/Time   NA 145 09/05/2017 1015   NA 141 07/23/2016 1255   K 4.2 09/05/2017 1015   K 4.1 07/23/2016 1255   CL 105 09/05/2017 1015   CO2 29 09/05/2017 1015   CO2 27 07/23/2016 1255   BUN 16 09/05/2017 1015   BUN 13.8 07/23/2016 1255   CREATININE 1.1 09/05/2017 1015   CREATININE 1.0 07/23/2016 1255      Component Value Date/Time   CALCIUM 9.9 09/05/2017 1015   CALCIUM 9.2 07/23/2016 1255   ALKPHOS 66 09/05/2017 1015   ALKPHOS 77 07/23/2016 1255   AST 38 09/05/2017 1015   AST 23 07/23/2016 1255   ALT 31 09/05/2017 1015   ALT 13 07/23/2016 1255   BILITOT 1.00 09/05/2017 1015   BILITOT 1.13 07/23/2016 1255     Impression and Plan: Brian Mays is a 72 year old white male. He has metastatic low-grade neuroendocrine carcinoma. He has hepatic metastases.  I really think that everything is going well for him with respect to the neuroendocrine carcinoma.  I think he is due for another Lutathera PET scan in a couple months.  We will go ahead with his Somatuline today.  I will plan to see him back in another 6 weeks.  I think we can probably go every 6 weeks for his  injections.   Burney Gauze, MD  11/5/201811:45 AM

## 2018-05-22 LAB — CHROMOGRANIN A: Chromogranin A: 15 nmol/L — ABNORMAL HIGH (ref 0–5)

## 2018-05-23 DIAGNOSIS — D1801 Hemangioma of skin and subcutaneous tissue: Secondary | ICD-10-CM | POA: Diagnosis not present

## 2018-05-23 DIAGNOSIS — L814 Other melanin hyperpigmentation: Secondary | ICD-10-CM | POA: Diagnosis not present

## 2018-05-23 DIAGNOSIS — D229 Melanocytic nevi, unspecified: Secondary | ICD-10-CM | POA: Diagnosis not present

## 2018-05-23 DIAGNOSIS — L819 Disorder of pigmentation, unspecified: Secondary | ICD-10-CM | POA: Diagnosis not present

## 2018-05-23 DIAGNOSIS — L821 Other seborrheic keratosis: Secondary | ICD-10-CM | POA: Diagnosis not present

## 2018-06-27 ENCOUNTER — Ambulatory Visit: Payer: Medicare HMO | Admitting: Cardiovascular Disease

## 2018-06-27 ENCOUNTER — Encounter: Payer: Self-pay | Admitting: Cardiovascular Disease

## 2018-06-27 VITALS — BP 138/64 | HR 67 | Ht 70.0 in | Wt 206.1 lb

## 2018-06-27 DIAGNOSIS — I10 Essential (primary) hypertension: Secondary | ICD-10-CM

## 2018-06-27 DIAGNOSIS — R011 Cardiac murmur, unspecified: Secondary | ICD-10-CM

## 2018-06-27 DIAGNOSIS — I2510 Atherosclerotic heart disease of native coronary artery without angina pectoris: Secondary | ICD-10-CM | POA: Diagnosis not present

## 2018-06-27 MED ORDER — LOSARTAN POTASSIUM 50 MG PO TABS: 50.0000 mg | ORAL_TABLET | Freq: Every day | ORAL | 3 refills | Status: DC

## 2018-06-27 MED ORDER — NITROGLYCERIN 0.4 MG SL SUBL: 0.4000 mg | SUBLINGUAL_TABLET | SUBLINGUAL | 4 refills | Status: DC | PRN

## 2018-06-27 NOTE — Progress Notes (Signed)
Cardiology Consultation:   Patient ID: [name]; [id number]; 07-24-1946   Admit date: (Not on file) Date of Consult: 06/27/2018  Primary Care Provider: Darreld Mclean, MD Primary Cardiologist:  Nahser  Primary Electrophysiologist:     Problem list 1.  Coronary artery disease: Status post rotational atherectomy and stenting of the RCA 2.  Hypertension 3.  Hyperlipidemia 4.  Hypothyroidism  Patient Profile:   Brian Mays is a 72 y.o. male with a hx of HTN, carcinoid , , coronary artery disease with a recent episode of weakness and near syncope.  who is being seen today for the evaluation of HTN  at the request of Dr. Marin Olp. He sees Dr. Marin Olp for Carcinoid       [name] is seen wife wife , [name] today   Hx of coronary stenting in March 2017 .   Was started on Metoprolol, he has had intermittent weakness and dizziness/lightheadedness since that time.  He eats and drinks regularly.   Generally gets some regular exercise.  Has had some back issues recently.  He still goes to the line does some exercises that do not cause further back pain.  Dr. Marin Olp asked him to reduce his BP meds.   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  [facility] . Does have some lightheadedness after working out .  No CP or dyspnea while working out.   No synmptoms similar to his MI.    He stopped the Losartan last week He decreased the metoprolol to 12. 5 mg BID last week.   Still eats salty foods. Wife cooks from [employer] .  Eats some pretzels.   Has flushing related to Carcinoid.    Typically occurs if he drinks some beer .   Feb.  12, 2019: Doing well Gaining some weight .   Gave him the OK to walk  No CP or dyspnea   Aug. 27 ,2019:  Doing well.   Feeling better since he decreased his metoprolol in half.     Past Medical History:  Diagnosis Date  . Anemia    in past  . Colon polyps   . Diverticulosis   . Gallstones   . GERD (gastroesophageal reflux disease)   . Heart attack ([place])    mild, Spring 2017  . Heart murmur   . Hemorrhoid   . Hiatal hernia   . HTN (hypertension)   . Hypothyroidism   . Metastatic carcinoma ([place]) 2010   Dr Jonette Eva  . Pancreatitis 1998   chronic    Past Surgical History:  Procedure Laterality Date  . APPENDECTOMY  1962  . CARDIAC CATHETERIZATION N/A 02/10/2016   Procedure: Left Heart Cath and Coronary Angiography;  Surgeon: Adrian Prows, MD;  Location: [facility];  Service: Cardiovascular;  Laterality: N/A;  . CARDIAC CATHETERIZATION  02/10/2016   Procedure: Coronary/Graft Atherectomy;  Surgeon: Adrian Prows, MD;  Location: [facility];  Service: Cardiovascular;;  . CARDIAC CATHETERIZATION  02/10/2016   Procedure: Coronary Stent Intervention;  Surgeon: Adrian Prows, MD;  Location: [facility];  Service: Cardiovascular;;  . CATARACT EXTRACTION W/ INTRAOCULAR LENS  IMPLANT, BILATERAL Bilateral   . IR GENERIC HISTORICAL  12/30/2015   IR RADIOLOGIST EVAL & MGMT 12/30/2015 Aletta Edouard, MD [facility] INTERV RAD  . IR GENERIC HISTORICAL  11/09/2016   IR  RADIOLOGIST EVAL & MGMT 11/09/2016 Aletta Edouard, MD [facility] INTERV RAD  . IR RADIOLOGIST EVAL & MGMT  03/01/2017  . IR RADIOLOGIST EVAL & MGMT  06/29/2017  . LAPAROSCOPIC CHOLECYSTECTOMY  1999  . LIVER BIOPSY  2010  . RADIOACTIVE SEED IMPLANT  X 3   "to my liver"  . TUMOR EXCISION  01/2009   Carcinoil Resection   . TUMOR REMOVAL     from small intestine     Home Medications:  Prior to Admission medications   Medication Sig Start Date End Date Taking? Authorizing Provider  amLODipine (NORVASC) 5 MG tablet Take 5 mg by mouth daily. 03/16/17  Yes [provider]  aspirin EC 81 MG tablet Take 81 mg by mouth every morning.   Yes [provider]  atorvastatin (LIPITOR) 20 MG tablet Take 20 mg daily by mouth.   Yes [provider]    Cholecalciferol 1000 UNITS tablet Take 1,000 Units by mouth daily.     Yes [provider]  fluticasone (FLONASE) 50 MCG/ACT nasal spray Place 2 sprays into both nostrils daily as needed (sinuses).  01/06/15  Yes [provider]  hydrocortisone (ANUSOL-HC) 25 MG suppository INSERT 1 SUPPOSITORY RECTALLY TWICE A DAY AS NEEDED FOR HEMORRHOIDS 02/07/15  Yes [place], Holli Humbles, NP  Lanreotide Acetate (SOMATULINE DEPOT [place]) Inject 120 mcg into the skin every 28 (twenty-eight) days. Receives at Dr Antonieta Pert office   Yes [provider]  lansoprazole (PREVACID) 30 MG capsule Take 1 capsule (30 mg total) by mouth daily at 12 noon. 11/19/16  Yes [place], Gay Filler, MD  levothyroxine (SYNTHROID, LEVOTHROID) 50 MCG tablet Take 1 tablet (50 mcg total) by mouth daily. 08/09/17  Yes Copland, Gay Filler, MD  naproxen (NAPROSYN) 500 MG tablet Take 1 tablet (500 mg total) by mouth 2 (two) times daily with a meal. 08/16/17  Yes Leandrew Koyanagi, MD  nitroGLYCERIN (NITROSTAT) 0.4 MG SL tablet Place 1 tablet (0.4 mg total) under the tongue every 5 (five) minutes x 3 doses as needed for chest pain. 02/11/16  Yes Adrian Prows, MD  Omega-3 Fatty Acids (FISH OIL) 1000 MG CAPS Take 1 capsule by mouth daily. Reported on 12/30/2015   Yes [provider]  predniSONE (DELTASONE) 50 MG tablet Take 50mg  at 13 hours, 7 hours, and 1 hour before scan 02/18/17  Yes [place], Holli Humbles, NP  sodium chloride (OCEAN) 0.65 % SOLN nasal spray Place 1 spray into both nostrils as needed for congestion.    Yes [provider]  ticagrelor (BRILINTA) 90 MG TABS tablet Take 1 tablet (90 mg total) by mouth 2 (two) times daily. 02/11/16  Yes Adrian Prows, MD  tiZANidine (ZANAFLEX) 4 MG tablet Take 1 tablet (4 mg total) by mouth every 6 (six) hours as needed for muscle spasms. 08/16/17  Yes Leandrew Koyanagi, MD  vitamin B-12 (CYANOCOBALAMIN) 1000 MCG tablet Take 1,000 mcg by mouth daily.   Yes [provider]   diphenhydrAMINE (BENADRYL) 50 MG tablet Take 1 tablet (50 mg total) by mouth once. Take 1 hour prior to scan. 02/18/17 02/18/17  [place], Holli Humbles, NP  metoprolol tartrate (LOPRESSOR) 25 MG tablet Take 0.5 tablets (12.5 mg total) 2 (two) times daily by mouth. 09/14/17   Nahser, Wonda Cheng, MD    Inpatient Medications: Scheduled Meds:  Continuous Infusions:  PRN Meds:   Allergies:    Allergies  Allergen Reactions  . Iohexol Hives     Code: HIVES, Desc: PER [name] @  [facility], PT IS ALLERGIC TO CONTRAST DYE 10/02/08/RM  05/01/10...needs full premeds per our protocol w/ [facility]., Onset Date: [id number]     Social History:   Social History   Socioeconomic History  . Marital status: Married    Spouse name: Not on file  . Number of children: 1  . Years of education: Not on file  . Highest education level: Not on file  Occupational History  . 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  Social Needs  . Financial resource strain: Not on file  . Food insecurity:    Worry: Not on file    Inability: Not on file  . Transportation needs:    Medical: Not on file    Non-medical: Not on file  Tobacco Use  . Smoking status: Former Smoker    Packs/day: 1.50    Years: 35.00    Pack years: 52.50    Types: Cigarettes    Start date: 09/25/1959    Last attempt to quit: 12/21/1997    Years since quitting: 20.5  . Smokeless tobacco: Never Used  Substance and Sexual Activity  . Alcohol use: Yes    Alcohol/week: 3.0 - 4.0 standard drinks    Types: 3 - 4 Cans of beer per week    Comment: 4 beers weekly   . Drug use: No  . Sexual activity: Yes  Lifestyle  . Physical activity:    Days per week: Not on file    Minutes per session: Not on file  . Stress: Not on file  Relationships  . Social connections:    Talks on phone: Not on file    Gets together: Not on file    Attends religious service: Not on file    Active member of club or organization: Not on file    Attends  meetings of clubs or organizations: Not on file    Relationship status: Not on file  . Intimate partner violence:    Fear of current or ex partner: Not on file    Emotionally abused: Not on file    Physically abused: Not on file    Forced sexual activity: Not on file  Other Topics Concern  . Not on file  Social History Narrative   Regular Exercise -  NO    Family History:    Family History  Problem Relation Age of Onset  . Kidney disease Mother   . Hyperlipidemia Mother   . Hypertension Mother   . COPD Father   . Ulcerative colitis Daughter      ROS:  Noted in current history, all other systems are negative.   Physical Exam: Blood pressure 138/64, pulse 67, height 5\' 10"  (1.778 m), weight 206 lb 1.9 oz (93.5 kg), SpO2 97 %.  GEN:  Well nourished, well developed in no acute distress HEENT: Normal NECK: No JVD; No carotid bruits LYMPHATICS: No lymphadenopathy CARDIAC: RR, soft systolic  murmur  RESPIRATORY:  Clear to auscultation without rales, wheezing or rhonchi  ABDOMEN: Soft, non-tender, non-distended MUSCULOSKELETAL:  No edema; No deformity  SKIN: Warm and dry NEUROLOGIC:  Alert and oriented x 3   EKG:   June 27, 2018: Normal sinus rhythm at 67.  Nonspecific ST adenopathies.  Otherwise normal EKG.  Relevant CV Studies:    Laboratory Data:  ChemistryNo results for input(s): NA, K, CL, CO2, GLUCOSE, BUN, CREATININE, CALCIUM, GFRNONAA, GFRAA, ANIONGAP in the last 168 hours.  No results for input(s): PROT, ALBUMIN, AST, ALT, ALKPHOS,  BILITOT in the last 168 hours. HematologyNo results for input(s): WBC, RBC, HGB, HCT, MCV, MCH, MCHC, RDW, PLT in the last 168 hours. Cardiac EnzymesNo results for input(s): TROPONINI in the last 168 hours. No results for input(s): TROPIPOC in the last 168 hours.  BNPNo results for input(s): BNP, PROBNP in the last 168 hours.  DDimer No results for input(s): DDIMER in the last 168 hours.  Radiology/Studies:  No results  found.  Assessment and Plan:     1. Coronary artery disease:    Doing well.  No angina  2.  Hypertension:    Will change back to Losartan 50 mg a day .   DC amlodipine .  Check BMP in 3 weeks   3.  Hyperlipidemia:   Check lipid profile  In 3 weeks   4.   Systolic murmur :   Hx of cardinoid Will get an echo   Follow up in 1 year     For questions or updates, please contact [facility] Please consult [email or web address] for contact info under Cardiology/STEMI.   Signed, Mertie Moores, MD  06/27/2018 9:00 AM    .Horace Porteous

## 2018-06-27 NOTE — Patient Instructions (Addendum)
Medication Instructions:  1) DISCONTINUE Amlodipine 2) START Losartan 50mg  once daily  Labwork:  Your physician recommends that you return for lab work in: 3 weeks (BMET and Lipids)   Testing/Procedures: Your physician has requested that you have an echocardiogram. Echocardiography is a painless test that uses sound waves to create images of your heart. It provides your doctor with information about the size and shape of your heart and how well your heart's chambers and valves are working. This procedure takes approximately one hour. There are no restrictions for this procedure.   Follow-Up: Your physician wants you to follow-up in: 1 year with Dr. Acie Fredrickson.  You will receive a reminder letter in the mail two months in advance. If you don't receive a letter, please call our office to schedule the follow-up appointment.   Any Other Special Instructions Will Be Listed Below (If Applicable).     If you need a refill on your cardiac medications before your next appointment, please call your pharmacy.

## 2018-06-30 ENCOUNTER — Inpatient Hospital Stay: Payer: Medicare HMO

## 2018-06-30 ENCOUNTER — Inpatient Hospital Stay: Payer: Medicare HMO | Attending: Hematology & Oncology | Admitting: Hematology & Oncology

## 2018-06-30 ENCOUNTER — Encounter: Payer: Self-pay | Admitting: Hematology & Oncology

## 2018-06-30 ENCOUNTER — Other Ambulatory Visit: Payer: Self-pay

## 2018-06-30 VITALS — BP 154/71 | HR 63 | Temp 98.3°F | Resp 18 | Wt 206.0 lb

## 2018-06-30 DIAGNOSIS — C7A8 Other malignant neuroendocrine tumors: Secondary | ICD-10-CM | POA: Diagnosis not present

## 2018-06-30 DIAGNOSIS — C7B8 Other secondary neuroendocrine tumors: Secondary | ICD-10-CM | POA: Diagnosis not present

## 2018-06-30 DIAGNOSIS — Z7982 Long term (current) use of aspirin: Secondary | ICD-10-CM | POA: Insufficient documentation

## 2018-06-30 DIAGNOSIS — Z79899 Other long term (current) drug therapy: Secondary | ICD-10-CM

## 2018-06-30 LAB — CMP (CANCER CENTER ONLY)
ALBUMIN: 3.2 g/dL — AB (ref 3.5–5.0)
ALK PHOS: 74 U/L (ref 26–84)
ALT: 14 U/L (ref 10–47)
AST: 30 U/L (ref 11–38)
Anion gap: 7 (ref 5–15)
BUN: 14 mg/dL (ref 7–22)
CO2: 31 mmol/L (ref 18–33)
CREATININE: 0.8 mg/dL (ref 0.60–1.20)
Calcium: 9.6 mg/dL (ref 8.0–10.3)
Chloride: 107 mmol/L (ref 98–108)
Glucose, Bld: 110 mg/dL (ref 73–118)
POTASSIUM: 4.3 mmol/L (ref 3.3–4.7)
SODIUM: 145 mmol/L (ref 128–145)
TOTAL PROTEIN: 7.6 g/dL (ref 6.4–8.1)
Total Bilirubin: 0.6 mg/dL (ref 0.2–1.6)

## 2018-06-30 LAB — CBC WITH DIFFERENTIAL (CANCER CENTER ONLY)
Basophils Absolute: 0 10*3/uL (ref 0.0–0.1)
Basophils Relative: 1 %
EOS ABS: 0.3 10*3/uL (ref 0.0–0.5)
Eosinophils Relative: 6 %
HEMATOCRIT: 35.8 % — AB (ref 38.7–49.9)
HEMOGLOBIN: 11.3 g/dL — AB (ref 13.0–17.1)
LYMPHS ABS: 0.7 10*3/uL — AB (ref 0.9–3.3)
Lymphocytes Relative: 15 %
MCH: 29.4 pg (ref 28.0–33.4)
MCHC: 31.6 g/dL — ABNORMAL LOW (ref 32.0–35.9)
MCV: 93.2 fL (ref 82.0–98.0)
Monocytes Absolute: 0.4 10*3/uL (ref 0.1–0.9)
Monocytes Relative: 8 %
NEUTROS ABS: 3.1 10*3/uL (ref 1.5–6.5)
NEUTROS PCT: 70 %
Platelet Count: 137 10*3/uL — ABNORMAL LOW (ref 145–400)
RBC: 3.84 MIL/uL — AB (ref 4.20–5.70)
RDW: 14.9 % (ref 11.1–15.7)
WBC: 4.4 10*3/uL (ref 4.0–10.0)

## 2018-06-30 LAB — LACTATE DEHYDROGENASE: LDH: 207 U/L — AB (ref 98–192)

## 2018-06-30 MED ORDER — LANREOTIDE ACETATE 120 MG/0.5ML ~~LOC~~ SOLN
120.0000 mg | Freq: Once | SUBCUTANEOUS | Status: AC
  Administered 2018-06-30: 120 mg via SUBCUTANEOUS

## 2018-06-30 MED ORDER — LANREOTIDE ACETATE 120 MG/0.5ML ~~LOC~~ SOLN
SUBCUTANEOUS | Status: AC
  Filled 2018-06-30: qty 120

## 2018-06-30 NOTE — Progress Notes (Signed)
Hematology and Oncology Follow Up Visit  Brian Mays [id number] 1946/04/07 72 y.o. 09/05/2017   Principle Diagnosis:  Metastatic low grade neuroendocrine tumor-hepatic metastases  Current Therapy:   S/p yttrium-90 intrahepatic therapy - November 2016 Somatuline 120 mg monthly Lutathera (Lu 177) injection on 02/01/2018 - s/p cycle #4    Interim History: Brian Mays is back for follow-up.  So far, he is doing okay.  He really has no specific complaints.  He says he feels so much better now that he has had his high blood pressure medicines readjusted.  He has had no issues with abdominal pain.  There is no diarrhea.  He has had no cough or shortness of breath.  He has had no leg swelling.  He has had no rashes.  He has had no fever.  Unfortunately, his golf game is not doing too well.  He is looking forward to going down to [facility] in the fall.  He actually was stationed at [employer] for [employer].  I never realized that he was in the [employer].  He and I talked a lot about this.  He did Arts development officer for jets.  He was stationed in [place] during the [place] War.  He actually met his wife in [place].   Overall, his performance status is ECOG 0.  Medications:  Allergies as of 09/05/2017      Reactions   Iohexol Hives    Code: HIVES, Desc: PER [name] @ [facility], PT IS ALLERGIC TO CONTRAST DYE 10/02/08/RM  05/01/10...needs full premeds per our protocol w/ [facility]., Onset Date: [id number]      Medication List        Accurate as of 09/05/17 11:45 AM. Always use your most recent med list.          amLODipine 5 MG tablet Commonly known as:  NORVASC Take 5 mg by mouth daily.   aspirin EC 81 MG tablet Take 81 mg by mouth every morning.   atorvastatin 20 MG tablet Commonly known as:  LIPITOR Take 20 mg daily by mouth.   Cholecalciferol 1000 units tablet Take 1,000 Units by mouth daily.   diphenhydrAMINE 50 MG tablet Commonly known as:  BENADRYL Take 1  tablet (50 mg total) by mouth once. Take 1 hour prior to scan.   Fish Oil 1000 MG Caps Take 1 capsule by mouth daily. Reported on 12/30/2015   fluticasone 50 MCG/ACT nasal spray Commonly known as:  FLONASE Place 2 sprays into both nostrils daily as needed (sinuses).   hydrocortisone 25 MG suppository Commonly known as:  ANUSOL-HC INSERT 1 SUPPOSITORY RECTALLY TWICE A DAY AS NEEDED FOR HEMORRHOIDS   lansoprazole 30 MG capsule Commonly known as:  PREVACID Take 1 capsule (30 mg total) by mouth daily at 12 noon.   levothyroxine 50 MCG tablet Commonly known as:  SYNTHROID, LEVOTHROID Take 1 tablet (50 mcg total) by mouth daily.   losartan 100 MG tablet Commonly known as:  COZAAR TAKE 1 TABLET (100 MG TOTAL) BY MOUTH AT BEDTIME.   metoprolol tartrate 25 MG tablet Commonly known as:  LOPRESSOR Take 1 tablet (25 mg total) by mouth 2 (two) times daily.   naproxen 500 MG tablet Commonly known as:  NAPROSYN Take 1 tablet (500 mg total) by mouth 2 (two) times daily with a meal.   nitroGLYCERIN 0.4 MG SL tablet Commonly known as:  NITROSTAT Place 1 tablet (0.4 mg total) under the tongue every 5 (five) minutes x  3 doses as needed for chest pain.   predniSONE 50 MG tablet Commonly known as:  DELTASONE Take 40m at 13 hours, 7 hours, and 1 hour before scan   sodium chloride 0.65 % Soln nasal spray Commonly known as:  OCEAN Place 1 spray into both nostrils as needed for congestion.   SOMATULINE DEPOT [place] Inject 120 mcg into the skin every 28 (twenty-eight) days. Receives at Dr EAntonieta Pertoffice   ticagrelor 90 MG Tabs tablet Commonly known as:  BRILINTA Take 1 tablet (90 mg total) by mouth 2 (two) times daily.   tiZANidine 4 MG tablet Commonly known as:  ZANAFLEX Take 1 tablet (4 mg total) by mouth every 6 (six) hours as needed for muscle spasms.   vitamin B-12 1000 MCG tablet Commonly known as:  CYANOCOBALAMIN Take 1,000 mcg by mouth daily.       Allergies:  Allergies    Allergen Reactions  . Iohexol Hives     Code: HIVES, Desc: PER [name] @ [facility], PT IS ALLERGIC TO CONTRAST DYE 10/02/08/RM  05/01/10...needs full premeds per our protocol w/ [facility]., Onset Date: [id number]    Past Medical History, Surgical history, Social history, and Family History were reviewed and updated.  Review of Systems: Review of Systems  Constitutional: Negative for appetite change, fatigue, fever and unexpected weight change.  HENT:   Negative for lump/mass, mouth sores, sore throat and trouble swallowing.   Respiratory: Negative for cough, hemoptysis and shortness of breath.   Cardiovascular: Negative for leg swelling and palpitations.  Gastrointestinal: Negative for abdominal distention, abdominal pain, blood in stool, constipation, diarrhea, nausea and vomiting.  Genitourinary: Negative for bladder incontinence, dysuria, frequency and hematuria.   Musculoskeletal: Negative for arthralgias, back pain, gait problem and myalgias.  Skin: Negative for itching and rash.  Neurological: Negative for dizziness, extremity weakness, gait problem, headaches, numbness, seizures and speech difficulty.  Hematological: Does not bruise/bleed easily.  Psychiatric/Behavioral: Negative for depression and sleep disturbance. The patient is not nervous/anxious.      Physical Exam:  weight is 203 lb 4 oz (92.2 kg). His oral temperature is 98.3 F (36.8 C). His blood pressure is 113/63 and his pulse is 70. His respiration is 18 and oxygen saturation is 98%.   Wt Readings from Last 3 Encounters:  09/05/17 203 lb 4 oz (92.2 kg)  08/25/17 205 lb (93 kg)  07/25/17 205 lb (93 kg)    Physical Exam  Constitutional: He is oriented to person, place, and time.  HENT:  Head: Normocephalic and atraumatic.  Mouth/Throat: Oropharynx is clear and moist.  Eyes: Pupils are equal, round, and reactive to light. EOM are normal.  Neck: Normal range of motion.  Cardiovascular: Normal rate, regular  rhythm and normal heart sounds.  Pulmonary/Chest: Effort normal and breath sounds normal.  Abdominal: Soft. Bowel sounds are normal.  Musculoskeletal: Normal range of motion. He exhibits no edema, tenderness or deformity.  Lymphadenopathy:    He has no cervical adenopathy.  Neurological: He is alert and oriented to person, place, and time.  Skin: Skin is warm and dry. No rash noted. No erythema.  Psychiatric: He has a normal mood and affect. His behavior is normal. Judgment and thought content normal.  Vitals reviewed. .   Lab Results  Component Value Date   WBC 6.4 09/05/2017   HGB 12.9 (L) 09/05/2017   HCT 40.1 09/05/2017   MCV 83 09/05/2017   PLT 142 (L) 09/05/2017   No results found for: FERRITIN,  IRON, TIBC, UIBC, IRONPCTSAT Lab Results  Component Value Date   RBC 4.81 09/05/2017   No results found for: KPAFRELGTCHN, LAMBDASER, KAPLAMBRATIO No results found for: IGGSERUM, IGA, IGMSERUM No results found for: Odetta Pink, SPEI   Chemistry      Component Value Date/Time   NA 145 09/05/2017 1015   NA 141 07/23/2016 1255   K 4.2 09/05/2017 1015   K 4.1 07/23/2016 1255   CL 105 09/05/2017 1015   CO2 29 09/05/2017 1015   CO2 27 07/23/2016 1255   BUN 16 09/05/2017 1015   BUN 13.8 07/23/2016 1255   CREATININE 1.1 09/05/2017 1015   CREATININE 1.0 07/23/2016 1255      Component Value Date/Time   CALCIUM 9.9 09/05/2017 1015   CALCIUM 9.2 07/23/2016 1255   ALKPHOS 66 09/05/2017 1015   ALKPHOS 77 07/23/2016 1255   AST 38 09/05/2017 1015   AST 23 07/23/2016 1255   ALT 31 09/05/2017 1015   ALT 13 07/23/2016 1255   BILITOT 1.00 09/05/2017 1015   BILITOT 1.13 07/23/2016 1255     Impression and Plan: Mr. Mayberry is a 72 year old white male. He has metastatic low-grade neuroendocrine carcinoma. He has hepatic metastases.  I really think that everything is going well for him with respect to the neuroendocrine carcinoma.  I  think he is due for another Lutathera PET scan in a couple months.  We will go ahead with his Somatuline today.  I will plan to see him back in another 6 weeks.  I think we can probably go every 6 weeks for his injections.   Burney Gauze, MD  11/5/201811:45 AM

## 2018-06-30 NOTE — Patient Instructions (Signed)
Lanreotide injection (Somatuline) What is this medicine? LANREOTIDE (lan REE oh tide) is used to reduce blood levels of growth hormone in patients with a condition called acromegaly. It also works to slow or stop tumor growth in patients with neuroendocrine tumors and treat carcinoid syndrome. This medicine may be used for other purposes; ask your health care provider or pharmacist if you have questions. COMMON BRAND NAME(S): Somatuline [place] What should I tell my health care provider before I take this medicine? They need to know if you have any of these conditions: -diabetes -gallbladder disease -heart disease -kidney disease -liver disease -thyroid disease -an unusual or allergic reaction to lanreotide, other medicines, foods, dyes, or preservatives -pregnant or trying to get pregnant -breast-feeding How should I use this medicine? This medicine is for injection under the skin. It is given by a health care professional in a hospital or clinic setting. Contact your pediatrician or health care professional regarding the use of this medicine in children. Special care may be needed. Overdosage: If you think you have taken too much of this medicine contact a poison control center or emergency room at once. NOTE: This medicine is only for you. Do not share this medicine with others. What if I miss a dose? It is important not to miss your dose. Call your doctor or health care professional if you are unable to keep an appointment. What may interact with this medicine? This medicine may interact with the following medications: -bromocriptine -cyclosporine -certain medicines for blood pressure, heart disease, irregular heart beat -certain medicines for diabetes -quinidine -terfenadine This list may not describe all possible interactions. Give your health care provider a list of all the medicines, herbs, non-prescription drugs, or dietary supplements you use. Also tell them if you smoke, drink  alcohol, or use illegal drugs. Some items may interact with your medicine. What should I watch for while using this medicine? Tell your doctor or healthcare professional if your symptoms do not start to get better or if they get worse. Visit your doctor or health care professional for regular checks on your progress. Your condition will be monitored carefully while you are receiving this medicine. You may need blood work done while you are taking this medicine. Women should inform their doctor if they wish to become pregnant or think they might be pregnant. There is a potential for serious side effects to an unborn child. Talk to your health care professional or pharmacist for more information. Do not breast-feed an infant while taking this medicine or for 6 months after stopping it. This medicine has caused ovarian failure in some women. This medicine may interfere with the ability to have a child. Talk with your doctor or health care professional if you are concerned about your fertility. What side effects may I notice from receiving this medicine? Side effects that you should report to your doctor or health care professional as soon as possible: -allergic reactions like skin rash, itching or hives, swelling of the face, lips, or tongue -increased blood pressure -severe stomach pain -signs and symptoms of high blood sugar such as dizziness; dry mouth; dry skin; fruity breath; nausea; stomach pain; increased hunger or thirst; increased urination -signs and symptoms of low blood sugar such as feeling anxious; confusion; dizziness; increased hunger; unusually weak or tired; sweating; shakiness; cold; irritable; headache; blurred vision; fast heartbeat; loss of consciousness -unusually slow heartbeat Side effects that usually do not require medical attention (report to your doctor or health care professional if they  continue or are bothersome): -constipation -diarrhea -dizziness -headache -muscle  pain -muscle spasms -nausea -pain, redness, or irritation at site where injected This list may not describe all possible side effects. Call your doctor for medical advice about side effects. You may report side effects to FDA at 1-800-FDA-1088. Where should I keep my medicine? This drug is given in a hospital or clinic and will not be stored at home. NOTE: This sheet is a summary. It may not cover all possible information. If you have questions about this medicine, talk to your doctor, pharmacist, or health care provider.  2018 Elsevier/Gold Standard (2016-07-23 10:33:47)

## 2018-07-04 LAB — CHROMOGRANIN A: CHROMOGRANIN A: 13 nmol/L — AB (ref 0–5)

## 2018-07-10 ENCOUNTER — Other Ambulatory Visit (HOSPITAL_COMMUNITY): Payer: Medicare HMO

## 2018-07-10 ENCOUNTER — Other Ambulatory Visit: Payer: Medicare HMO

## 2018-07-18 ENCOUNTER — Other Ambulatory Visit: Payer: Medicare HMO

## 2018-07-18 ENCOUNTER — Ambulatory Visit (HOSPITAL_COMMUNITY): Payer: Medicare HMO | Attending: Cardiology

## 2018-07-18 ENCOUNTER — Other Ambulatory Visit: Payer: Self-pay

## 2018-07-18 DIAGNOSIS — I10 Essential (primary) hypertension: Secondary | ICD-10-CM

## 2018-07-18 DIAGNOSIS — I083 Combined rheumatic disorders of mitral, aortic and tricuspid valves: Secondary | ICD-10-CM | POA: Diagnosis not present

## 2018-07-18 DIAGNOSIS — I509 Heart failure, unspecified: Secondary | ICD-10-CM | POA: Insufficient documentation

## 2018-07-18 DIAGNOSIS — I110 Hypertensive heart disease with heart failure: Secondary | ICD-10-CM | POA: Diagnosis not present

## 2018-07-18 DIAGNOSIS — I2510 Atherosclerotic heart disease of native coronary artery without angina pectoris: Secondary | ICD-10-CM

## 2018-07-18 DIAGNOSIS — Z87891 Personal history of nicotine dependence: Secondary | ICD-10-CM | POA: Insufficient documentation

## 2018-07-18 DIAGNOSIS — R011 Cardiac murmur, unspecified: Secondary | ICD-10-CM | POA: Insufficient documentation

## 2018-07-18 LAB — BASIC METABOLIC PANEL
BUN/Creatinine Ratio: 16 (ref 10–24)
BUN: 16 mg/dL (ref 8–27)
CHLORIDE: 101 mmol/L (ref 96–106)
CO2: 25 mmol/L (ref 20–29)
CREATININE: 1.01 mg/dL (ref 0.76–1.27)
Calcium: 9.5 mg/dL (ref 8.6–10.2)
GFR calc Af Amer: 86 mL/min/{1.73_m2} (ref >59)
GFR calc non Af Amer: 74 mL/min/{1.73_m2} (ref >59)
GLUCOSE: 105 mg/dL — AB (ref 65–99)
Potassium: 4.6 mmol/L (ref 3.5–5.2)
Sodium: 141 mmol/L (ref 134–144)

## 2018-07-18 LAB — LIPID PANEL
CHOLESTEROL TOTAL: 84 mg/dL — AB (ref 100–199)
Chol/HDL Ratio: 2 ratio (ref 0.0–5.0)
HDL: 43 mg/dL (ref >39)
LDL Calculated: 29 mg/dL (ref 0–99)
TRIGLYCERIDES: 61 mg/dL (ref 0–149)
VLDL Cholesterol Cal: 12 mg/dL (ref 5–40)

## 2018-07-19 ENCOUNTER — Encounter: Payer: Self-pay | Admitting: Family Medicine

## 2018-08-09 ENCOUNTER — Telehealth: Payer: Self-pay

## 2018-08-09 NOTE — Telephone Encounter (Signed)
Received call from pt's spouse, [name] stating that pt is dealing with a large amount of stress and requesting that Dr Marin Olp prescribe something to help pt "calm down." Per Dr Marin Olp, this request should go through pt's PCP. [name] aware and verbalizes understanding and appreciation. dph

## 2018-08-11 ENCOUNTER — Inpatient Hospital Stay: Payer: Medicare HMO

## 2018-08-11 ENCOUNTER — Encounter: Payer: Self-pay | Admitting: Hematology & Oncology

## 2018-08-11 ENCOUNTER — Inpatient Hospital Stay: Payer: Medicare HMO | Attending: Hematology & Oncology | Admitting: Hematology & Oncology

## 2018-08-11 ENCOUNTER — Other Ambulatory Visit: Payer: Self-pay

## 2018-08-11 VITALS — BP 190/81 | HR 65 | Temp 98.2°F | Resp 18 | Wt 201.0 lb

## 2018-08-11 DIAGNOSIS — C7A8 Other malignant neuroendocrine tumors: Secondary | ICD-10-CM

## 2018-08-11 DIAGNOSIS — C7B8 Other secondary neuroendocrine tumors: Secondary | ICD-10-CM | POA: Diagnosis not present

## 2018-08-11 DIAGNOSIS — E032 Hypothyroidism due to medicaments and other exogenous substances: Secondary | ICD-10-CM

## 2018-08-11 DIAGNOSIS — Z7982 Long term (current) use of aspirin: Secondary | ICD-10-CM | POA: Diagnosis not present

## 2018-08-11 DIAGNOSIS — Z79899 Other long term (current) drug therapy: Secondary | ICD-10-CM | POA: Diagnosis not present

## 2018-08-11 DIAGNOSIS — D508 Other iron deficiency anemias: Secondary | ICD-10-CM

## 2018-08-11 DIAGNOSIS — E538 Deficiency of other specified B group vitamins: Secondary | ICD-10-CM

## 2018-08-11 LAB — CBC WITH DIFFERENTIAL (CANCER CENTER ONLY)
Abs Immature Granulocytes: 0.01 10*3/uL (ref 0.00–0.07)
BASOS PCT: 1 %
Basophils Absolute: 0 10*3/uL (ref 0.0–0.1)
Eosinophils Absolute: 0.3 10*3/uL (ref 0.0–0.5)
Eosinophils Relative: 5 %
HCT: 36.6 % — ABNORMAL LOW (ref 39.0–52.0)
HEMOGLOBIN: 11.4 g/dL — AB (ref 13.0–17.0)
IMMATURE GRANULOCYTES: 0 %
Lymphocytes Relative: 13 %
Lymphs Abs: 0.6 10*3/uL — ABNORMAL LOW (ref 0.7–4.0)
MCH: 28.6 pg (ref 26.0–34.0)
MCHC: 31.1 g/dL (ref 30.0–36.0)
MCV: 91.7 fL (ref 80.0–100.0)
MONOS PCT: 7 %
Monocytes Absolute: 0.3 10*3/uL (ref 0.1–1.0)
NEUTROS ABS: 3.7 10*3/uL (ref 1.7–7.7)
NEUTROS PCT: 74 %
PLATELETS: 126 10*3/uL — AB (ref 150–400)
RBC: 3.99 MIL/uL — AB (ref 4.22–5.81)
RDW: 15 % (ref 11.5–15.5)
WBC Count: 5 10*3/uL (ref 4.0–10.5)
nRBC: 0 % (ref 0.0–0.2)

## 2018-08-11 LAB — CMP (CANCER CENTER ONLY)
ALT: 25 U/L (ref 10–47)
AST: 28 U/L (ref 11–38)
Albumin: 3.4 g/dL — ABNORMAL LOW (ref 3.5–5.0)
Alkaline Phosphatase: 66 U/L (ref 26–84)
Anion gap: 1 — ABNORMAL LOW (ref 5–15)
BUN: 11 mg/dL (ref 7–22)
CALCIUM: 9.4 mg/dL (ref 8.0–10.3)
CO2: 32 mmol/L (ref 18–33)
Chloride: 108 mmol/L (ref 98–108)
Creatinine: 1.1 mg/dL (ref 0.60–1.20)
GLUCOSE: 118 mg/dL (ref 73–118)
Potassium: 5 mmol/L — ABNORMAL HIGH (ref 3.3–4.7)
SODIUM: 141 mmol/L (ref 128–145)
TOTAL PROTEIN: 7.2 g/dL (ref 6.4–8.1)
Total Bilirubin: 0.7 mg/dL (ref 0.2–1.6)

## 2018-08-11 MED ORDER — LANREOTIDE ACETATE 120 MG/0.5ML ~~LOC~~ SOLN
SUBCUTANEOUS | Status: AC
  Filled 2018-08-11: qty 120

## 2018-08-11 MED ORDER — LANREOTIDE ACETATE 120 MG/0.5ML ~~LOC~~ SOLN
120.0000 mg | Freq: Once | SUBCUTANEOUS | Status: AC
  Administered 2018-08-11: 120 mg via SUBCUTANEOUS

## 2018-08-11 NOTE — Progress Notes (Signed)
Hematology and Oncology Follow Up Visit  Brian Mays [id number] [birth year] 72 y.o. 09/05/2017   Principle Diagnosis:  Metastatic low grade neuroendocrine tumor-hepatic metastases  Current Therapy:   S/p yttrium-90 intrahepatic therapy - November 2016 Somatuline 120 mg monthly Lutathera (Lu 177) injection on 02/01/2018 - s/p cycle #4    Interim History: Brian Mays is back for follow-up.  He is under a lot of stress right now.  Apparently, there is some issues that are going on down in [place].  There is a time share that he somehow got tricked into extending.  He really does not want to do this.  I really do not think that he should do this.  He has metastatic cancer.  Is in his liver.  I am not sure that he really is able to travel and be at the time share for the length of time that is required.  Hopefully, he will be able to get the money back and break his contract.  I am not sure when radiology is going to do another Lutathera PET scan on him.  I would like to think that this should be coming up soon.  He has had no problems with abdominal pain.  He has had no cough.  His blood pressure has been quite high.  He has been quite anxious because of the issues with this time share.  He has had a little bit of leg swelling.  Currently, his performance status is ECOG 1-2.   Medications:  Allergies as of 09/05/2017      Reactions   Iohexol Hives    Code: HIVES, Desc: PER [name] @ [facility], PT IS ALLERGIC TO CONTRAST DYE 10/02/08/RM  05/01/10...needs full premeds per our protocol w/ [facility]., Onset Date: [id number]      Medication List        Accurate as of 09/05/17 11:45 AM. Always use your most recent med list.          amLODipine 5 MG tablet Commonly known as:  NORVASC Take 5 mg by mouth daily.   aspirin EC 81 MG tablet Take 81 mg by mouth every morning.   atorvastatin 20 MG tablet Commonly known as:  LIPITOR Take 20 mg daily by mouth.   Cholecalciferol 1000 units  tablet Take 1,000 Units by mouth daily.   diphenhydrAMINE 50 MG tablet Commonly known as:  BENADRYL Take 1 tablet (50 mg total) by mouth once. Take 1 hour prior to scan.   Fish Oil 1000 MG Caps Take 1 capsule by mouth daily. Reported on 12/30/2015   fluticasone 50 MCG/ACT nasal spray Commonly known as:  FLONASE Place 2 sprays into both nostrils daily as needed (sinuses).   hydrocortisone 25 MG suppository Commonly known as:  ANUSOL-HC INSERT 1 SUPPOSITORY RECTALLY TWICE A DAY AS NEEDED FOR HEMORRHOIDS   lansoprazole 30 MG capsule Commonly known as:  PREVACID Take 1 capsule (30 mg total) by mouth daily at 12 noon.   levothyroxine 50 MCG tablet Commonly known as:  SYNTHROID, LEVOTHROID Take 1 tablet (50 mcg total) by mouth daily.   losartan 100 MG tablet Commonly known as:  COZAAR TAKE 1 TABLET (100 MG TOTAL) BY MOUTH AT BEDTIME.   metoprolol tartrate 25 MG tablet Commonly known as:  LOPRESSOR Take 1 tablet (25 mg total) by mouth 2 (two) times daily.   naproxen 500 MG tablet Commonly known as:  NAPROSYN Take 1 tablet (500 mg total) by mouth 2 (two) times daily with a meal.  nitroGLYCERIN 0.4 MG SL tablet Commonly known as:  NITROSTAT Place 1 tablet (0.4 mg total) under the tongue every 5 (five) minutes x 3 doses as needed for chest pain.   predniSONE 50 MG tablet Commonly known as:  DELTASONE Take 50mg  at 13 hours, 7 hours, and 1 hour before scan   sodium chloride 0.65 % Soln nasal spray Commonly known as:  OCEAN Place 1 spray into both nostrils as needed for congestion.   SOMATULINE DEPOT [place] Inject 120 mcg into the skin every 28 (twenty-eight) days. Receives at Dr Antonieta Pert office   ticagrelor 90 MG Tabs tablet Commonly known as:  BRILINTA Take 1 tablet (90 mg total) by mouth 2 (two) times daily.   tiZANidine 4 MG tablet Commonly known as:  ZANAFLEX Take 1 tablet (4 mg total) by mouth every 6 (six) hours as needed for muscle spasms.   vitamin B-12 1000 MCG  tablet Commonly known as:  CYANOCOBALAMIN Take 1,000 mcg by mouth daily.       Allergies:  Allergies  Allergen Reactions  . Iohexol Hives     Code: HIVES, Desc: PER [name] @ [facility], PT IS ALLERGIC TO CONTRAST DYE 10/02/08/RM  05/01/10...needs full premeds per our protocol w/ [facility]., Onset Date: [id number]     Past Medical History, Surgical history, Social history, and Family History were reviewed and updated.  Review of Systems: Review of Systems  Constitutional: Negative for appetite change, fatigue, fever and unexpected weight change.  HENT:   Negative for lump/mass, mouth sores, sore throat and trouble swallowing.   Respiratory: Negative for cough, hemoptysis and shortness of breath.   Cardiovascular: Negative for leg swelling and palpitations.  Gastrointestinal: Negative for abdominal distention, abdominal pain, blood in stool, constipation, diarrhea, nausea and vomiting.  Genitourinary: Negative for bladder incontinence, dysuria, frequency and hematuria.   Musculoskeletal: Negative for arthralgias, back pain, gait problem and myalgias.  Skin: Negative for itching and rash.  Neurological: Negative for dizziness, extremity weakness, gait problem, headaches, numbness, seizures and speech difficulty.  Hematological: Does not bruise/bleed easily.  Psychiatric/Behavioral: Negative for depression and sleep disturbance. The patient is not nervous/anxious.      Physical Exam:  weight is 203 lb 4 oz (92.2 kg). His oral temperature is 98.3 F (36.8 C). His blood pressure is 113/63 and his pulse is 70. His respiration is 18 and oxygen saturation is 98%.   Wt Readings from Last 3 Encounters:  09/05/17 203 lb 4 oz (92.2 kg)  08/25/17 205 lb (93 kg)  07/25/17 205 lb (93 kg)    Physical Exam  Constitutional: He is oriented to person, place, and time.  HENT:  Head: Normocephalic and atraumatic.  Mouth/Throat: Oropharynx is clear and moist.  Eyes: Pupils are equal, round, and  reactive to light. EOM are normal.  Neck: Normal range of motion.  Cardiovascular: Normal rate, regular rhythm and normal heart sounds.  Pulmonary/Chest: Effort normal and breath sounds normal.  Abdominal: Soft. Bowel sounds are normal.  Musculoskeletal: Normal range of motion. He exhibits no edema, tenderness or deformity.  Lymphadenopathy:    He has no cervical adenopathy.  Neurological: He is alert and oriented to person, place, and time.  Skin: Skin is warm and dry. No rash noted. No erythema.  Psychiatric: He has a normal mood and affect. His behavior is normal. Judgment and thought content normal.  Vitals reviewed. .   Lab Results  Component Value Date   WBC 6.4 09/05/2017   HGB 12.9 (L) 09/05/2017  HCT 40.1 09/05/2017   MCV 83 09/05/2017   PLT 142 (L) 09/05/2017   No results found for: FERRITIN, IRON, TIBC, UIBC, IRONPCTSAT Lab Results  Component Value Date   RBC 4.81 09/05/2017   No results found for: KPAFRELGTCHN, LAMBDASER, KAPLAMBRATIO No results found for: IGGSERUM, IGA, IGMSERUM No results found for: Odetta Pink, SPEI   Chemistry      Component Value Date/Time   NA 145 09/05/2017 1015   NA 141 07/23/2016 1255   K 4.2 09/05/2017 1015   K 4.1 07/23/2016 1255   CL 105 09/05/2017 1015   CO2 29 09/05/2017 1015   CO2 27 07/23/2016 1255   BUN 16 09/05/2017 1015   BUN 13.8 07/23/2016 1255   CREATININE 1.1 09/05/2017 1015   CREATININE 1.0 07/23/2016 1255      Component Value Date/Time   CALCIUM 9.9 09/05/2017 1015   CALCIUM 9.2 07/23/2016 1255   ALKPHOS 66 09/05/2017 1015   ALKPHOS 77 07/23/2016 1255   AST 38 09/05/2017 1015   AST 23 07/23/2016 1255   ALT 31 09/05/2017 1015   ALT 13 07/23/2016 1255   BILITOT 1.00 09/05/2017 1015   BILITOT 1.13 07/23/2016 1255     Impression and Plan: Mr. Cahoon is a 72 year old white male. He has metastatic low-grade neuroendocrine carcinoma. He has hepatic  metastases.  We will give him the Somatuline injection today.  I will had to make sure that radiology has his [place] PET scan set up.  I will see him back in 6 more weeks.   Burney Gauze, MD  11/5/201811:45 AM

## 2018-08-12 NOTE — Progress Notes (Addendum)
[place] at [facility] [address], [place], [place] [address] [phone]  Date:  08/14/2018   Name:  Brian Mays   DOB:  09/11/1946   MRN:  [id number]  PCP:  Darreld Mclean, MD    Chief Complaint: Stress (discuss stress level, bp running high-bp med changed possible reason? was on paxil in the past) and Flu Vaccine   History of Present Illness:  Brian Mays is a 71 y.o. very pleasant male patient who presents with the following: Last visit with me in December of last year for a sinus infection- Here today with concern of stress and anxiety.  He notes that he is "kind of stressed out lately" He notes that his BP is high- they stopped his amlodipine and is now just taking losartan and metoprolol He also thinks that some of her BP may be due to stress related to a time share.  He is really upset as he feels like he has been tricked by the time share company.  He plans to consult a lawyer to help him with this issue which sounds like a good idea.    This time share situation has been going on for a month Brian Mays notes that he is getting "angry as hell every time I think of what they did to me," he is not sleeping well.  He has tried and OTC sleep med and this did help, he is not sure what it was however.  He was on paxil in the past - he did well with this.  Would be interested in going back on this medication for a while.  He is not feeling so much down or depressed.  He denies any SI   Lab Results  Component Value Date   TSH 3.820 09/05/2017   History of CAD with h/o stent 2017, neuroendocrine tumor with mets to liver, HTN, hypothyroidism  Former smoker   Per very recnet oncology note, Dr. Marin Olp: Interim History: Brian Mays is back for follow-up.  He is under a lot of stress right now.  Apparently, there is some issues that are going on down in [place].  There is a time share that he somehow got tricked into extending.  He  really does not want to do this.  I really do not think that he should do this.  He has metastatic cancer.  Is in his liver.  I am not sure that he really is able to travel and be at the time share for the length of time that is required.  Hopefully, he will be able to get the money back and break his contract. I am not sure when radiology is going to do another Lutathera PET scan on him.  I would like to think that this should be coming up soon.  Per most recent cardiology note with [facility]: 1. Coronary artery disease:    Doing well.  No angina 2.  Hypertension:    Will change back to Losartan 50 mg a day .   DC amlodipine .  Check BMP in 3 weeks  3.  Hyperlipidemia:   Check lipid profile  In 3 weeks  4.   Systolic murmur :   Hx of cardinoid Will get an echo   His echo did look ok  Needs flu shot- do today Could do a dose of pneumovax as his last was done prior to age 85- he was actually 42 when  he had this shot. Discussed with pt and he will hold off on repeating this today, may be more beneficial to do in a few years   Aspirin lipitor plavix Somatuline- has used for 9 years or so Prevacid Synthroid Losartan 50 Metoprolol  BP Readings from Last 3 Encounters:  08/14/18 (!) 164/90  08/11/18 (!) 190/81  06/30/18 (!) 154/71     Patient Active Problem List   Diagnosis Date Noted  . Lumbar radiculopathy 08/16/2017  . CAD (coronary artery disease) 09/21/2016  . Unstable angina ([place]) 02/09/2016  . Vitamin D deficiency 03/11/2015  . Metastatic malignant neuroendocrine tumor to liver ([place]) 02/14/2015  . Hyperglycemia 04/03/2012  . Diarrhea 03/29/2012  . B12 deficiency 04/19/2011  . TOBACCO USE, QUIT 10/13/2009  . Unspecified vitamin D deficiency 06/19/2009  . Neoplasm by body site 12/23/2008  . DIVERTICULOSIS, COLON 11/08/2008  . ABDOMINAL PAIN 10/02/2008  . Hypothyroidism 12/13/2007  . HEMORRHOIDS, NOS 12/13/2007  . COLONIC POLYPS, HX OF 12/13/2007  . Essential hypertension  09/15/2007  . GERD 09/15/2007  . PANCREATITIS, CHRONIC 09/15/2007  . CARDIAC MURMUR 09/15/2007  . SNORING 09/15/2007    Past Medical History:  Diagnosis Date  . Anemia    in past  . Colon polyps   . Diverticulosis   . Gallstones   . GERD (gastroesophageal reflux disease)   . Heart attack ([place])    mild, Spring 2017  . Heart murmur   . Hemorrhoid   . Hiatal hernia   . HTN (hypertension)   . Hypothyroidism   . Metastatic carcinoma ([place]) 2010   Dr Jonette Eva  . Pancreatitis 1998   chronic    Past Surgical History:  Procedure Laterality Date  . APPENDECTOMY  1962  . CARDIAC CATHETERIZATION N/A 02/10/2016   Procedure: Left Heart Cath and Coronary Angiography;  Surgeon: Adrian Prows, MD;  Location: [facility];  Service: Cardiovascular;  Laterality: N/A;  . CARDIAC CATHETERIZATION  02/10/2016   Procedure: Coronary/Graft Atherectomy;  Surgeon: Adrian Prows, MD;  Location: [facility];  Service: Cardiovascular;;  . CARDIAC CATHETERIZATION  02/10/2016   Procedure: Coronary Stent Intervention;  Surgeon: Adrian Prows, MD;  Location: [facility];  Service: Cardiovascular;;  . CATARACT EXTRACTION W/ INTRAOCULAR LENS  IMPLANT, BILATERAL Bilateral   . IR GENERIC HISTORICAL  12/30/2015   IR RADIOLOGIST EVAL & MGMT 12/30/2015 Aletta Edouard, MD [facility] INTER[facility] RAD  . IR GENERIC HISTORICAL  11/09/2016   IR RADIOLOGIST EVAL & MGMT 11/09/2016 Aletta Edouard, MD [facility] INTERV RAD  . IR RADIOLOGIST EVAL & MGMT  03/01/2017  . IR RADIOLOGIST EVAL & MGMT  06/29/2017  . LAPAROSCOPIC CHOLECYSTECTOMY  1999  . LIVER BIOPSY  2010  . RADIOACTIVE SEED IMPLANT  X 3   "to my liver"  . TUMOR EXCISION  01/2009   Carcinoil Resection   . TUMOR REMOVAL     from small intestine    Social History   Tobacco Use  . Smoking status: Former Smoker    Packs/day: 1.50    Years: 35.00    Pack years: 52.50    Types: Cigarettes    Start date: 09/25/1959    Last attempt to quit: 12/21/1997    Years since  quitting: 20.6  . Smokeless tobacco: Never Used  Substance Use Topics  . Alcohol use: Yes    Alcohol/week: 3.0 - 4.0 standard drinks    Types: 3 - 4 Cans of beer per week    Comment: 4 beers weekly   .  Drug use: No    Family History  Problem Relation Age of Onset  . Kidney disease Mother   . Hyperlipidemia Mother   . Hypertension Mother   . COPD Father   . Ulcerative colitis Daughter     Allergies  Allergen Reactions  . Iohexol Hives     Code: HIVES, Desc: PER [name] @ PRIMARY CARE, PT IS ALLERGIC TO CONTRAST DYE 10/02/08/RM  05/01/10...needs full premeds per our protocol w/ [facility]., Onset Date: [id number]     Medication list has been reviewed and updated.  Current Outpatient Medications on File Prior to Visit  Medication Sig Dispense Refill  . aspirin EC 81 MG tablet Take 81 mg by mouth every morning.    [name] atorvastatin (LIPITOR) 20 MG tablet Take 20 mg daily by mouth.    . Cholecalciferol 1000 UNITS tablet Take 1,000 Units by mouth daily.      . clopidogrel (PLAVIX) 75 MG tablet Take 1 tablet (75 mg total) by mouth daily. 90 tablet 3  . fluticasone (FLONASE) 50 MCG/ACT nasal spray Place 2 sprays into both nostrils daily as needed (sinuses).   11  . hydrocortisone (ANUSOL-HC) 25 MG suppository INSERT 1 SUPPOSITORY RECTALLY TWICE A DAY AS NEEDED FOR HEMORRHOIDS 30 suppository 0  . Lanreotide Acetate (SOMATULINE DEPOT [place]) Inject 120 mcg into the skin every 28 (twenty-eight) days. Receives at Dr Antonieta Pert office    . lansoprazole (PREVACID) 30 MG capsule Take 1 capsule (30 mg total) by mouth daily at 12 noon. 90 capsule 3  . levothyroxine (SYNTHROID, LEVOTHROID) 50 MCG tablet Take 1 tablet (50 mcg total) by mouth daily. 90 tablet 1  . losartan (COZAAR) 50 MG tablet Take 1 tablet (50 mg total) by mouth daily. 90 tablet 3  . metoprolol tartrate (LOPRESSOR) 25 MG tablet Take 0.5 tablets (12.5 mg total) 2 (two) times daily by mouth. 90 tablet 3  . nitroGLYCERIN (NITROSTAT) 0.4 MG SL  tablet Place 1 tablet (0.4 mg total) under the tongue every 5 (five) minutes x 3 doses as needed for chest pain. 25 tablet 4  . Omega-3 Fatty Acids (FISH OIL) 1000 MG CAPS Take 1 capsule by mouth daily. Reported on 12/30/2015    . sodium chloride (OCEAN) 0.65 % SOLN nasal spray Place 1 spray into both nostrils as needed for congestion.     . vitamin B-12 (CYANOCOBALAMIN) 1000 MCG tablet Take 1,000 mcg by mouth daily.     No current facility-administered medications on file prior to visit.     Review of Systems:  As per HPI- otherwise negative. No fever or chills No CP or SOB    Physical Examination: Vitals:   08/14/18 1002  BP: (!) 164/90  Pulse: 71  Resp: 16  Temp: 98.2 F (36.8 C)  SpO2: 98%   Vitals:   08/14/18 1002  Weight: 200 lb 9.6 oz (91 kg)  Height: 5\' 10"  (1.778 m)   Body mass index is 28.78 kg/m. Ideal Body Weight: Weight in (lb) to have BMI = 25: 173.9  GEN: WDWN, NAD, Non-toxic, A & O x 3, looks well, mild overweight  HEENT: Atraumatic, Normocephalic. Neck supple. No masses, No LAD.  Bilateral TM wnl, oropharynx normal.  PEERL,EOMI.   Ears and Nose: No external deformity. CV: RRR, No M/G/R. No JVD. No thrill. No extra heart sounds. PULM: CTA B, no wheezes, crackles, rhonchi. No retractions. No resp. distress. No accessory muscle use. EXTR: No c/c/e NEURO Normal gait.  PSYCH: Normally interactive. Conversant. Not  depressed or anxious appearing.  Calm demeanor.    Assessment and Plan: Mixed hyperlipidemia  Essential hypertension, benign  Acquired hypothyroidism - Plan: TSH  Screening for prostate cancer - Plan: PSA  Hyperkalemia  Situational anxiety - Plan: PARoxetine (PAXIL) 10 MG tablet  He has 25 mg losartan at home- he will try increasing from 50 to 75 mg a day and let me know how his BP responds to this change He is due for a PSA and TSH today  His lipids have been under good control  Will start him on paxil 10 mg and he will see me in 8  weeks to check on how he is doing with this    Signed Lamar Blinks, MD   Received his labs  Results for orders placed or performed in visit on 08/14/18  TSH  Result Value Ref Range   TSH 2.42 0.35 - 4.50 uIU/mL  PSA  Result Value Ref Range   PSA 0.34 0.10 - 4.00 ng/mL   Message to pt

## 2018-08-14 ENCOUNTER — Ambulatory Visit (INDEPENDENT_AMBULATORY_CARE_PROVIDER_SITE_OTHER): Payer: Medicare HMO | Admitting: Family Medicine

## 2018-08-14 ENCOUNTER — Encounter: Payer: Self-pay | Admitting: Family Medicine

## 2018-08-14 VITALS — BP 165/80 | HR 71 | Temp 98.2°F | Resp 16 | Ht 70.0 in | Wt 200.6 lb

## 2018-08-14 DIAGNOSIS — Z125 Encounter for screening for malignant neoplasm of prostate: Secondary | ICD-10-CM

## 2018-08-14 DIAGNOSIS — I10 Essential (primary) hypertension: Secondary | ICD-10-CM

## 2018-08-14 DIAGNOSIS — F418 Other specified anxiety disorders: Secondary | ICD-10-CM

## 2018-08-14 DIAGNOSIS — E039 Hypothyroidism, unspecified: Secondary | ICD-10-CM | POA: Diagnosis not present

## 2018-08-14 DIAGNOSIS — R69 Illness, unspecified: Secondary | ICD-10-CM | POA: Diagnosis not present

## 2018-08-14 DIAGNOSIS — E782 Mixed hyperlipidemia: Secondary | ICD-10-CM

## 2018-08-14 LAB — CHROMOGRANIN A: CHROMOGRANIN A: 9 nmol/L — AB (ref 0–5)

## 2018-08-14 LAB — PSA: PSA: 0.34 ng/mL (ref 0.10–4.00)

## 2018-08-14 LAB — TSH: TSH: 2.42 u[IU]/mL (ref 0.35–4.50)

## 2018-08-14 MED ORDER — PAROXETINE HCL 10 MG PO TABS: 10.0000 mg | ORAL_TABLET | Freq: Every day | ORAL | 5 refills | Status: DC

## 2018-08-14 NOTE — Patient Instructions (Signed)
It was good to see you today- take care and I will be in touch with your labs asap! It looks like Dr. Acie Fredrickson wanted to check your metabolic profile in 3 weeks so we will hold off on this today Try taking 75 mg of losartan and send me a message with a BP update in a few days We will also check on your thyroid and PSA today  We will start you on paxil 10 mg once a day for your anxiety- let me know if this is not helping. Let's visit in about 8 weeks to check on your progress

## 2018-08-15 ENCOUNTER — Encounter: Payer: Self-pay | Admitting: Family Medicine

## 2018-08-16 ENCOUNTER — Encounter: Payer: Self-pay | Admitting: Family Medicine

## 2018-08-17 NOTE — Telephone Encounter (Signed)
The patient has had multiple days of higher blood pressures: Monday: 168/78 Tuesday: 144/70 Thursday: 160/84  He was wondering if he needed to restart the amlodipine. He went to PCP and they increase losartan to 75 mg a day and he has taken the medication consistently.  Sending to Dr. Acie Fredrickson for recommendations.

## 2018-08-17 NOTE — Telephone Encounter (Signed)
Entered in error

## 2018-08-25 ENCOUNTER — Encounter: Payer: Self-pay | Admitting: Hematology & Oncology

## 2018-08-29 ENCOUNTER — Telehealth: Payer: Self-pay | Admitting: *Deleted

## 2018-08-29 DIAGNOSIS — I10 Essential (primary) hypertension: Secondary | ICD-10-CM

## 2018-08-29 MED ORDER — HYDROCHLOROTHIAZIDE 25 MG PO TABS: 12.5000 mg | ORAL_TABLET | Freq: Every day | ORAL | 3 refills | Status: DC

## 2018-08-29 MED ORDER — LOSARTAN POTASSIUM 100 MG PO TABS: 100.0000 mg | ORAL_TABLET | Freq: Every day | ORAL | 3 refills | Status: DC

## 2018-08-29 NOTE — Telephone Encounter (Signed)
Jdyn's BP has continued to be slighlty elevated  Lets start HCTZ 25 mg tabs - he should start with 1/2 tab a day ( 12.5 mg a day )   Have him continue to monitor his bp  Check BMP in 3 weeks    Thanks    Abbe Amsterdam

## 2018-08-29 NOTE — Telephone Encounter (Signed)
Called patient and confirmed BP medications. He is currently on losartan 100 mg daily. Will add hctz 12.5 mg daily. Has f/u with Dr. Acie Fredrickson on 11/15 and can have BMET checked at that time.  Pt will continue to monitor and send in BP readings.

## 2018-09-01 ENCOUNTER — Telehealth: Payer: Self-pay | Admitting: *Deleted

## 2018-09-01 NOTE — Telephone Encounter (Signed)
Per Dr. Marin Olp, I called and spoke to the patient and informed him that his letters are ready for pickup. They are at the front desk with Hoyle Sauer. He verbalized understanding.

## 2018-09-05 ENCOUNTER — Other Ambulatory Visit: Payer: Self-pay | Admitting: Family Medicine

## 2018-09-05 DIAGNOSIS — F418 Other specified anxiety disorders: Secondary | ICD-10-CM

## 2018-09-08 ENCOUNTER — Other Ambulatory Visit: Payer: Self-pay | Admitting: Cardiovascular Disease

## 2018-09-08 ENCOUNTER — Other Ambulatory Visit: Payer: Self-pay | Admitting: Family Medicine

## 2018-09-15 ENCOUNTER — Ambulatory Visit: Payer: Medicare HMO | Admitting: Cardiovascular Disease

## 2018-09-15 ENCOUNTER — Encounter: Payer: Self-pay | Admitting: Cardiovascular Disease

## 2018-09-15 ENCOUNTER — Other Ambulatory Visit: Payer: Medicare HMO | Admitting: *Deleted

## 2018-09-15 VITALS — BP 114/62 | HR 100 | Ht 70.0 in | Wt 197.0 lb

## 2018-09-15 DIAGNOSIS — I10 Essential (primary) hypertension: Secondary | ICD-10-CM | POA: Diagnosis not present

## 2018-09-15 DIAGNOSIS — I2510 Atherosclerotic heart disease of native coronary artery without angina pectoris: Secondary | ICD-10-CM

## 2018-09-15 LAB — BASIC METABOLIC PANEL
BUN / CREAT RATIO: 14 (ref 10–24)
BUN: 14 mg/dL (ref 8–27)
CHLORIDE: 96 mmol/L (ref 96–106)
CO2: 27 mmol/L (ref 20–29)
CREATININE: 0.99 mg/dL (ref 0.76–1.27)
Calcium: 10.2 mg/dL (ref 8.6–10.2)
GFR calc Af Amer: 88 mL/min/{1.73_m2} (ref >59)
GFR calc non Af Amer: 76 mL/min/{1.73_m2} (ref >59)
GLUCOSE: 62 mg/dL — AB (ref 65–99)
Potassium: 4.3 mmol/L (ref 3.5–5.2)
Sodium: 138 mmol/L (ref 134–144)

## 2018-09-15 NOTE — Patient Instructions (Signed)
Medication Instructions:  Your physician recommends that you continue on your current medications as directed. Please refer to the Current Medication list given to you today.  If you need a refill on your cardiac medications before your next appointment, please call your pharmacy.   Lab work: TODAY - basic metabolic panel  If you have labs (blood work) drawn today and your tests are completely normal, you will receive your results only by: [name] MyChart Message (if you have MyChart) OR . A paper copy in the mail If you have any lab test that is abnormal or we need to change your treatment, we will call you to review the results.   Testing/Procedures: None Ordered   Follow-Up: At [facility], you and your health needs are our priority.  As part of our continuing mission to provide you with exceptional heart care, we have created designated Provider Care Teams.  These Care Teams include your primary Cardiologist (physician) and Advanced Practice Providers (APPs -  Physician Assistants and Nurse Practitioners) who all work together to provide you with the care you need, when you need it. You will need a follow up appointment in:  6 months.  Please call our office 2 months in advance to schedule this appointment.  You may see Mertie Moores, MD or one of the following Advanced Practice Providers on your designated Care Team: Richardson Dopp, PA-C [place], [place] . Daune Perch, NP

## 2018-09-15 NOTE — Progress Notes (Signed)
Patient ID: EUELL SCHIFF; [id number]; 1945/12/20   Admit date: (Not on file) Date of Consult: 09/15/2018  Primary Care Provider: Darreld Mclean, MD Primary Cardiologist:    Primary Electrophysiologist:     Problem list 1.  Coronary artery disease: Status post rotational atherectomy and stenting of the RCA 2.  Hypertension 3.  Hyperlipidemia 4.  Hypothyroidism  Patient Profile:   SHAQUAN MISSEY is a 72 y.o. male with a hx of HTN, , coronary artery disease with a recent episode of weakness and near syncope.  who is being seen today for the evaluation of HTN  at the request of Dr. Marin Olp. He sees Dr. Marin Olp for Carcinoid       Mr. Santiel Topper is seen wife wife , [name] today   Hx of coronary stenting in March 2017 .   Was started on Metoprolol, he has had intermittent weakness and dizziness/lightheadedness since that time.  He eats and drinks regularly.   Generally gets some regular exercise.  Has had some back issues recently.  He still goes to the line does some exercises that do not cause further back pain.  Dr. Marin Olp asked him to reduce his BP meds.   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  [facility] . Does have some lightheadedness after working out .  No CP or dyspnea while working out.   No synmptoms similar to his MI.    He stopped the Losartan last week He decreased the metoprolol to 12. 5 mg BID last week.   Still eats salty foods. Wife cooks from [employer] .  Eats some pretzels.   Has flushing related to Carcinoid.    Typically occurs if he drinks some beer .   Feb.  12, 2019: Doing well Gaining some weight .   Gave him the OK to walk  No CP or dyspnea   Aug. 27 ,2019:  Doing well.   Feeling better since he decreased his metoprolol in half.   September 15, 2018: Seen back today for follow-up of his coronary artery disease, hypertension,  hyperlipidemia. He increase his losartan 3 weeks ago. No CP  BP readings at home have been elevated.  Is not exercising    Past Medical History:  Diagnosis Date  . Anemia    in past  . Colon polyps   . Diverticulosis   . Gallstones   . GERD (gastroesophageal reflux disease)   . Heart attack ([place])    mild, Spring 2017  . Heart murmur   . Hemorrhoid   . Hiatal hernia   . HTN (hypertension)   . Hypothyroidism   . Metastatic carcinoma ([place]) 2010   Dr Jonette Eva  . Pancreatitis 1998   chronic    Past Surgical History:  Procedure Laterality Date  . APPENDECTOMY  1962  . CARDIAC CATHETERIZATION N/A 02/10/2016   Procedure: Left Heart Cath and Coronary Angiography;  Surgeon: Adrian Prows, MD;  Location: [facility];  Service: Cardiovascular;  Laterality: N/A;  . CARDIAC CATHETERIZATION  02/10/2016   Procedure: Coronary/Graft Atherectomy;  Surgeon: Adrian Prows, MD;  Location: [facility];  Service: Cardiovascular;;  . CARDIAC CATHETERIZATION  02/10/2016   Procedure: Coronary Stent Intervention;  Surgeon: Adrian Prows, MD;  Location: [facility];  Service: Cardiovascular;;  . CATARACT EXTRACTION W/ INTRAOCULAR LENS  IMPLANT, BILATERAL  Bilateral   . IR GENERIC HISTORICAL  12/30/2015   IR RADIOLOGIST EVAL & MGMT 12/30/2015 Aletta Edouard, MD [facility] INTERV RAD  . IR GENERIC HISTORICAL  11/09/2016   IR RADIOLOGIST EVAL & MGMT 11/09/2016 Aletta Edouard, MD [facility] INTERV RAD  . IR RADIOLOGIST EVAL & MGMT  03/01/2017  . IR RADIOLOGIST EVAL & MGMT  06/29/2017  . LAPAROSCOPIC CHOLECYSTECTOMY  1999  . LIVER BIOPSY  2010  . RADIOACTIVE SEED IMPLANT  X 3   "to my liver"  . TUMOR EXCISION  01/2009   Carcinoil Resection   . TUMOR REMOVAL     from small intestine     Home Medications:  Prior to Admission medications   Medication Sig Start Date End Date Taking? Authorizing Provider  amLODipine (NORVASC) 5 MG tablet Take 5 mg by mouth daily. 03/16/17  Yes [provider]  aspirin EC  81 MG tablet Take 81 mg by mouth every morning.   Yes [provider]  atorvastatin (LIPITOR) 20 MG tablet Take 20 mg daily by mouth.   Yes [provider]  Cholecalciferol 1000 UNITS tablet Take 1,000 Units by mouth daily.     Yes [provider]  fluticasone (FLONASE) 50 MCG/ACT nasal spray Place 2 sprays into both nostrils daily as needed (sinuses).  01/06/15  Yes [provider]  hydrocortisone (ANUSOL-HC) 25 MG suppository INSERT 1 SUPPOSITORY RECTALLY TWICE A DAY AS NEEDED FOR HEMORRHOIDS 02/07/15  Yes [place], Holli Humbles, NP  Lanreotide Acetate (SOMATULINE DEPOT [place]) Inject 120 mcg into the skin every 28 (twenty-eight) days. Receives at Dr Antonieta Pert office   Yes [provider]  lansoprazole (PREVACID) 30 MG capsule Take 1 capsule (30 mg total) by mouth daily at 12 noon. 11/19/16  Yes [place], Gay Filler, MD  levothyroxine (SYNTHROID, LEVOTHROID) 50 MCG tablet Take 1 tablet (50 mcg total) by mouth daily. 08/09/17  Yes [place], Gay Filler, MD  naproxen (NAPROSYN) 500 MG tablet Take 1 tablet (500 mg total) by mouth 2 (two) times daily with a meal. 08/16/17  Yes Leandrew Koyanagi, MD  nitroGLYCERIN (NITROSTAT) 0.4 MG SL tablet Place 1 tablet (0.4 mg total) under the tongue every 5 (five) minutes x 3 doses as needed for chest pain. 02/11/16  Yes Adrian Prows, MD  Omega-3 Fatty Acids (FISH OIL) 1000 MG CAPS Take 1 capsule by mouth daily. Reported on 12/30/2015   Yes [provider]  predniSONE (DELTASONE) 50 MG tablet Take 50mg  at 13 hours, 7 hours, and 1 hour before scan 02/18/17  Yes [place], Holli Humbles, NP  sodium chloride (OCEAN) 0.65 % SOLN nasal spray Place 1 spray into both nostrils as needed for congestion.    Yes [provider]  ticagrelor (BRILINTA) 90 MG TABS tablet Take 1 tablet (90 mg total) by mouth 2 (two) times daily. 02/11/16  Yes Adrian Prows, MD  tiZANidine (ZANAFLEX) 4 MG tablet Take 1 tablet (4 mg total) by mouth every 6 (six) hours as  needed for muscle spasms. 08/16/17  Yes Leandrew Koyanagi, MD  vitamin B-12 (CYANOCOBALAMIN) 1000 MCG tablet Take 1,000 mcg by mouth daily.   Yes [provider]  diphenhydrAMINE (BENADRYL) 50 MG tablet Take 1 tablet (50 mg total) by mouth once. Take 1 hour prior to scan. 02/18/17 02/18/17  [place], Holli Humbles, NP  metoprolol tartrate (LOPRESSOR) 25 MG tablet Take 0.5 tablets (12.5 mg total) 2 (two) times daily by mouth. 09/14/17   , Wonda Cheng, MD    Inpatient Medications:  Scheduled Meds:  Continuous Infusions:  PRN Meds:   Allergies:    Allergies  Allergen Reactions  . Iohexol Hives     Code: HIVES, Desc: PER [name] @ PRIMARY CARE, PT IS ALLERGIC TO CONTRAST DYE 10/02/08/RM  05/01/10...needs full premeds per our protocol w/ [facility]., Onset Date: [id number]     Social History:   Social History   Socioeconomic History  . Marital status: Married    Spouse name: Not on file  . Number of children: 1  . Years of education: Not on file  . Highest education level: Not on file  Occupational History  . 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  Social Needs  . Financial resource strain: Not on file  . Food insecurity:    Worry: Not on file    Inability: Not on file  . Transportation needs:    Medical: Not on file    Non-medical: Not on file  Tobacco Use  . Smoking status: Former Smoker    Packs/day: 1.50    Years: 35.00    Pack years: 52.50    Types: Cigarettes    Start date: 09/25/1959    Last attempt to quit: 12/21/1997    Years since quitting: 20.7  . Smokeless tobacco: Never Used  Substance and Sexual Activity  . Alcohol use: Yes    Alcohol/week: 3.0 - 4.0 standard drinks    Types: 3 - 4 Cans of beer per week    Comment: 4 beers weekly   . Drug use: No  . Sexual activity: Yes  Lifestyle  . Physical activity:    Days per week: Not on file    Minutes per session: Not on file  . Stress: Not on file  Relationships  . Social connections:     Talks on phone: Not on file    Gets together: Not on file    Attends religious service: Not on file    Active member of club or organization: Not on file    Attends meetings of clubs or organizations: Not on file    Relationship status: Not on file  . Intimate partner violence:    Fear of current or ex partner: Not on file    Emotionally abused: Not on file    Physically abused: Not on file    Forced sexual activity: Not on file  Other Topics Concern  . Not on file  Social History Narrative   Regular Exercise -  NO    Family History:    Family History  Problem Relation Age of Onset  . Kidney disease Mother   . Hyperlipidemia Mother   . Hypertension Mother   . COPD Father   . Ulcerative colitis Daughter      ROS:  Noted in current history, all other systems are negative.   Physical Exam: Blood pressure 114/62, pulse 100, height 5\' 10"  (1.778 m), weight 197 lb (89.4 kg), SpO2 92 %.  GEN:  Well nourished, well developed in no acute distress HEENT: Normal NECK: No JVD; No carotid bruits LYMPHATICS: No lymphadenopathy CARDIAC: RRR ,, sotf systolic murmur  RESPIRATORY:  Clear to auscultation without rales, wheezing or rhonchi  ABDOMEN: Soft, non-tender, non-distended MUSCULOSKELETAL:  No edema; No deformity  SKIN: Warm and dry NEUROLOGIC:  Alert and oriented x 3   EKG:      Relevant CV Studies:    Laboratory Data:  ChemistryNo results for input(s): NA, K, CL, CO2, GLUCOSE, BUN, CREATININE, CALCIUM, GFRNONAA, GFRAA,  ANIONGAP in the last 168 hours.  No results for input(s): PROT, ALBUMIN, AST, ALT, ALKPHOS, BILITOT in the last 168 hours. HematologyNo results for input(s): WBC, RBC, HGB, HCT, MCV, MCH, MCHC, RDW, PLT in the last 168 hours. Cardiac EnzymesNo results for input(s): TROPONINI in the last 168 hours. No results for input(s): TROPIPOC in the last 168 hours.  BNPNo results for input(s): BNP, PROBNP in the last 168 hours.  DDimer No results for input(s):  DDIMER in the last 168 hours.  Radiology/Studies:  No results found.  Assessment and Plan:     1. Coronary artery disease:     He is not having any episodes of angina.  Continue current medications. 2.  Hypertension:     Has been well controlled.  We recently increased his dose of losartan. Check a basic metabolic profile today.  3.  Hyperlipidemia:   Lipid levels from September look great.  4.   Systolic murmur :     He has mild tricuspid regurgitation.  He also has mild aortic insufficiency.  For questions or updates, please contact [place] Please consult [email or web address] for contact info under Cardiology/STEMI.   Signed, Mertie Moores, MD  09/15/2018 9:37 AM

## 2018-09-22 ENCOUNTER — Inpatient Hospital Stay: Payer: Medicare HMO

## 2018-09-22 ENCOUNTER — Encounter: Payer: Self-pay | Admitting: Hematology & Oncology

## 2018-09-22 ENCOUNTER — Other Ambulatory Visit: Payer: Self-pay

## 2018-09-22 ENCOUNTER — Inpatient Hospital Stay: Payer: Medicare HMO | Attending: Hematology & Oncology | Admitting: Hematology & Oncology

## 2018-09-22 VITALS — BP 135/67 | HR 77 | Temp 98.2°F | Resp 18 | Wt 199.2 lb

## 2018-09-22 DIAGNOSIS — Z7982 Long term (current) use of aspirin: Secondary | ICD-10-CM | POA: Diagnosis not present

## 2018-09-22 DIAGNOSIS — C7B8 Other secondary neuroendocrine tumors: Secondary | ICD-10-CM | POA: Insufficient documentation

## 2018-09-22 DIAGNOSIS — D508 Other iron deficiency anemias: Secondary | ICD-10-CM

## 2018-09-22 DIAGNOSIS — C7A8 Other malignant neuroendocrine tumors: Secondary | ICD-10-CM | POA: Insufficient documentation

## 2018-09-22 DIAGNOSIS — Z79899 Other long term (current) drug therapy: Secondary | ICD-10-CM | POA: Insufficient documentation

## 2018-09-22 DIAGNOSIS — E032 Hypothyroidism due to medicaments and other exogenous substances: Secondary | ICD-10-CM

## 2018-09-22 DIAGNOSIS — E538 Deficiency of other specified B group vitamins: Secondary | ICD-10-CM

## 2018-09-22 LAB — CBC WITH DIFFERENTIAL (CANCER CENTER ONLY)
Abs Immature Granulocytes: 0.03 10*3/uL (ref 0.00–0.07)
BASOS PCT: 1 %
Basophils Absolute: 0 10*3/uL (ref 0.0–0.1)
EOS ABS: 0.3 10*3/uL (ref 0.0–0.5)
Eosinophils Relative: 5 %
HCT: 40 % (ref 39.0–52.0)
Hemoglobin: 12.2 g/dL — ABNORMAL LOW (ref 13.0–17.0)
IMMATURE GRANULOCYTES: 1 %
Lymphocytes Relative: 12 %
Lymphs Abs: 0.7 10*3/uL (ref 0.7–4.0)
MCH: 28.4 pg (ref 26.0–34.0)
MCHC: 30.5 g/dL (ref 30.0–36.0)
MCV: 93 fL (ref 80.0–100.0)
Monocytes Absolute: 0.3 10*3/uL (ref 0.1–1.0)
Monocytes Relative: 5 %
NEUTROS ABS: 4.2 10*3/uL (ref 1.7–7.7)
NEUTROS PCT: 76 %
PLATELETS: 148 10*3/uL — AB (ref 150–400)
RBC: 4.3 MIL/uL (ref 4.22–5.81)
RDW: 15.1 % (ref 11.5–15.5)
WBC: 5.4 10*3/uL (ref 4.0–10.5)
nRBC: 0 % (ref 0.0–0.2)

## 2018-09-22 LAB — IRON AND TIBC
IRON: 51 ug/dL (ref 42–163)
SATURATION RATIOS: 14 % — AB (ref 20–55)
TIBC: 366 ug/dL (ref 202–409)
UIBC: 316 ug/dL (ref 117–376)

## 2018-09-22 LAB — CMP (CANCER CENTER ONLY)
ALK PHOS: 74 U/L (ref 26–84)
ALT: 20 U/L (ref 10–47)
ANION GAP: 15 (ref 5–15)
AST: 34 U/L (ref 11–38)
Albumin: 3.5 g/dL (ref 3.5–5.0)
BILIRUBIN TOTAL: 0.8 mg/dL (ref 0.2–1.6)
BUN: 11 mg/dL (ref 7–22)
CALCIUM: 9.9 mg/dL (ref 8.0–10.3)
CO2: 31 mmol/L (ref 18–33)
Chloride: 102 mmol/L (ref 98–108)
Creatinine: 1.1 mg/dL (ref 0.60–1.20)
GLUCOSE: 140 mg/dL — AB (ref 73–118)
POTASSIUM: 4.3 mmol/L (ref 3.3–4.7)
Sodium: 148 mmol/L — ABNORMAL HIGH (ref 128–145)
TOTAL PROTEIN: 7.7 g/dL (ref 6.4–8.1)

## 2018-09-22 LAB — FERRITIN: Ferritin: 51 ng/mL (ref 24–336)

## 2018-09-22 LAB — LACTATE DEHYDROGENASE: LDH: 213 U/L — ABNORMAL HIGH (ref 98–192)

## 2018-09-22 MED ORDER — LANREOTIDE ACETATE 120 MG/0.5ML ~~LOC~~ SOLN
120.0000 mg | Freq: Once | SUBCUTANEOUS | Status: AC
  Administered 2018-09-22: 120 mg via SUBCUTANEOUS

## 2018-09-22 MED ORDER — LANREOTIDE ACETATE 120 MG/0.5ML ~~LOC~~ SOLN
SUBCUTANEOUS | Status: AC
  Filled 2018-09-22: qty 120

## 2018-09-22 NOTE — Progress Notes (Signed)
Hematology and Oncology Follow Up Visit  Brian Mays [id number] [birth year] 72 y.o. 09/05/2017   Principle Diagnosis:  Metastatic low grade neuroendocrine tumor-hepatic metastases  Current Therapy:   S/p yttrium-90 intrahepatic therapy - November 2016 Somatuline 120 mg monthly Lutathera (Lu 177) injection on 02/01/2018 - s/p cycle #4    Interim History: Brian Mays is back for follow-up.  He is doing a little bit better than when we last saw him.  He still is having to deal with a unusual situation.  Hopefully, this will be resolved.  He is not sure when he is going to have his next Lutathera PET scan.  He had his last one back in May.  I would think that he will be having one every 6 months.  I thought that interventional radiology is taking care of this.  If not, and I will certainly be more than happy to order a scan.  His last chromogranin A was done back in October and this was 9.  He is had no diarrhea.  He has had some abdominal discomfort.  He does get fatigued pretty easily.  His blood pressure is doing a lot better right now.  He has had no bleeding.  He has had no rashes.  He is looking forward to a quiet Thanksgiving and Christmas holiday.  Overall, his performance status is ECOG 1-2.   Medications:  Allergies as of 09/05/2017      Reactions   Iohexol Hives    Code: HIVES, Desc: PER [name] @ [facility]IMARY [facility]ARE, PT IS ALLERGIC TO CONTRAST DYE 10/02/08/RM  05/01/10...needs full premeds per our protocol w/ [facility]., Onset Date: [id number]      Medication List        Accurate as of 09/05/17 11:45 AM. Always use your most recent med list.          amLODipine 5 MG tablet Commonly known as:  NORVASC Take 5 mg by mouth daily.   aspirin EC 81 MG tablet Take 81 mg by mouth every morning.   atorvastatin 20 MG tablet Commonly known as:  LIPITOR Take 20 mg daily by mouth.   Cholecalciferol 1000 units tablet Take 1,000 Units by mouth daily.   diphenhydrAMINE 50 MG  tablet Commonly known as:  BENADRYL Take 1 tablet (50 mg total) by mouth once. Take 1 hour prior to scan.   Fish Oil 1000 MG Caps Take 1 capsule by mouth daily. Reported on 12/30/2015   fluticasone 50 MCG/ACT nasal spray Commonly known as:  FLONASE Place 2 sprays into both nostrils daily as needed (sinuses).   hydrocortisone 25 MG suppository Commonly known as:  ANUSOL-HC INSERT 1 SUPPOSITORY RECTALLY TWICE A DAY AS NEEDED FOR HEMORRHOIDS   lansoprazole 30 MG capsule Commonly known as:  PREVACID Take 1 capsule (30 mg total) by mouth daily at 12 noon.   levothyroxine 50 MCG tablet Commonly known as:  SYNTHROID, LEVOTHROID Take 1 tablet (50 mcg total) by mouth daily.   losartan 100 MG tablet Commonly known as:  COZAAR TAKE 1 TABLET (100 MG TOTAL) BY MOUTH AT BEDTIME.   metoprolol tartrate 25 MG tablet Commonly known as:  LOPRESSOR Take 1 tablet (25 mg total) by mouth 2 (two) times daily.   naproxen 500 MG tablet Commonly known as:  NAPROSYN Take 1 tablet (500 mg total) by mouth 2 (two) times daily with a meal.   nitroGLYCERIN 0.4 MG SL tablet Commonly known as:  NITROSTAT Place 1 tablet (0.4 mg total) under the  tongue every 5 (five) minutes x 3 doses as needed for chest pain.   predniSONE 50 MG tablet Commonly known as:  DELTASONE Take 50mg  at 13 hours, 7 hours, and 1 hour before scan   sodium chloride 0.65 % Soln nasal spray Commonly known as:  OCEAN Place 1 spray into both nostrils as needed for congestion.   SOMATULINE DEPOT [place] Inject 120 mcg into the skin every 28 (twenty-eight) days. Receives at Dr Antonieta Pert office   ticagrelor 90 MG Tabs tablet Commonly known as:  BRILINTA Take 1 tablet (90 mg total) by mouth 2 (two) times daily.   tiZANidine 4 MG tablet Commonly known as:  ZANAFLEX Take 1 tablet (4 mg total) by mouth every 6 (six) hours as needed for muscle spasms.   vitamin B-12 1000 MCG tablet Commonly known as:  CYANOCOBALAMIN Take 1,000 mcg by mouth  daily.       Allergies:  Allergies  Allergen Reactions  . Iohexol Hives     Code: HIVES, Desc: PER [name] @ [facility]IMARY [facility]ARE, PT IS ALLERGIC TO CONTRAST DYE 10/02/08/RM  05/01/10...needs full premeds per our protocol w/ [facility]., Onset Date: [id number]     Past Medical History, Surgical history, Social history, and Family History were reviewed and updated.  Review of Systems: Review of Systems  Constitutional: Negative for appetite change, fatigue, fever and unexpected weight change.  HENT:   Negative for lump/mass, mouth sores, sore throat and trouble swallowing.   Respiratory: Negative for cough, hemoptysis and shortness of breath.   Cardiovascular: Negative for leg swelling and palpitations.  Gastrointestinal: Negative for abdominal distention, abdominal pain, blood in stool, constipation, diarrhea, nausea and vomiting.  Genitourinary: Negative for bladder incontinence, dysuria, frequency and hematuria.   Musculoskeletal: Negative for arthralgias, back pain, gait problem and myalgias.  Skin: Negative for itching and rash.  Neurological: Negative for dizziness, extremity weakness, gait problem, headaches, numbness, seizures and speech difficulty.  Hematological: Does not bruise/bleed easily.  Psychiatric/Behavioral: Negative for depression and sleep disturbance. The patient is not nervous/anxious.      Physical Exam:  weight is 203 lb 4 oz (92.2 kg). His oral temperature is 98.3 F (36.8 C). His blood pressure is 113/63 and his pulse is 70. His respiration is 18 and oxygen saturation is 98%.   Wt Readings from Last 3 Encounters:  09/05/17 203 lb 4 oz (92.2 kg)  08/25/17 205 lb (93 kg)  07/25/17 205 lb (93 kg)    Physical Exam  Constitutional: He is oriented to person, place, and time.  HENT:  Head: Normocephalic and atraumatic.  Mouth/Throat: Oropharynx is clear and moist.  Eyes: Pupils are equal, round, and reactive to light. EOM are normal.  Neck: Normal range of motion.    Cardiovascular: Normal rate, regular rhythm and normal heart sounds.  Pulmonary/Chest: Effort normal and breath sounds normal.  Abdominal: Soft. Bowel sounds are normal.  Musculoskeletal: Normal range of motion. He exhibits no edema, tenderness or deformity.  Lymphadenopathy:    He has no cervical adenopathy.  Neurological: He is alert and oriented to person, place, and time.  Skin: Skin is warm and dry. No rash noted. No erythema.  Psychiatric: He has a normal mood and affect. His behavior is normal. Judgment and thought content normal.  Vitals reviewed. .   Lab Results  Component Value Date   WBC 6.4 09/05/2017   HGB 12.9 (L) 09/05/2017   HCT 40.1 09/05/2017   MCV 83 09/05/2017   PLT 142 (L) 09/05/2017  No results found for: FERRITIN, IRON, TIBC, UIBC, IRONPCTSAT Lab Results  Component Value Date   RBC 4.81 09/05/2017   No results found for: KPAFRELGTCHN, LAMBDASER, KAPLAMBRATIO No results found for: IGGSERUM, IGA, IGMSERUM No results found for: Odetta Pink, SPEI   Chemistry      Component Value Date/Time   NA 145 09/05/2017 1015   NA 141 07/23/2016 1255   K 4.2 09/05/2017 1015   K 4.1 07/23/2016 1255   CL 105 09/05/2017 1015   CO2 29 09/05/2017 1015   CO2 27 07/23/2016 1255   BUN 16 09/05/2017 1015   BUN 13.8 07/23/2016 1255   CREATININE 1.1 09/05/2017 1015   CREATININE 1.0 07/23/2016 1255      Component Value Date/Time   CALCIUM 9.9 09/05/2017 1015   CALCIUM 9.2 07/23/2016 1255   ALKPHOS 66 09/05/2017 1015   ALKPHOS 77 07/23/2016 1255   AST 38 09/05/2017 1015   AST 23 07/23/2016 1255   ALT 31 09/05/2017 1015   ALT 13 07/23/2016 1255   BILITOT 1.00 09/05/2017 1015   BILITOT 1.13 07/23/2016 1255     Impression and Plan: Brian Mays is a 72 year old white male. He has metastatic low-grade neuroendocrine carcinoma. He has hepatic metastases.  I really need to make sure that he gets his PET scan done  before the end of the year.  I think it is imperative that we see what this looks like.  He we will get his Somatuline today.  We will see what his chromogranin A level is.  I will plan to have him come back to see [place] in another 4 weeks.   Burney Gauze, MD  11/5/201811:45 AM

## 2018-09-22 NOTE — Patient Instructions (Signed)
Lanreotide injection What is this medicine? LANREOTIDE (lan REE oh tide) is used to reduce blood levels of growth hormone in patients with a condition called acromegaly. It also works to slow or stop tumor growth in patients with neuroendocrine tumors and treat carcinoid syndrome. This medicine may be used for other purposes; ask your health care provider or pharmacist if you have questions. COMMON BRAND NAME(S): Somatuline [place] What should I tell my health care provider before I take this medicine? They need to know if you have any of these conditions: -diabetes -gallbladder disease -heart disease -kidney disease -liver disease -thyroid disease -an unusual or allergic reaction to lanreotide, other medicines, foods, dyes, or preservatives -pregnant or trying to get pregnant -breast-feeding How should I use this medicine? This medicine is for injection under the skin. It is given by a health care professional in a hospital or clinic setting. Contact your pediatrician or health care professional regarding the use of this medicine in children. Special care may be needed. Overdosage: If you think you have taken too much of this medicine contact a poison control center or emergency room at once. NOTE: This medicine is only for you. Do not share this medicine with others. What if I miss a dose? It is important not to miss your dose. Call your doctor or health care professional if you are unable to keep an appointment. What may interact with this medicine? This medicine may interact with the following medications: -bromocriptine -cyclosporine -certain medicines for blood pressure, heart disease, irregular heart beat -certain medicines for diabetes -quinidine -terfenadine This list may not describe all possible interactions. Give your health care provider a list of all the medicines, herbs, non-prescription drugs, or dietary supplements you use. Also tell them if you smoke, drink alcohol, or  use illegal drugs. Some items may interact with your medicine. What should I watch for while using this medicine? Tell your doctor or healthcare professional if your symptoms do not start to get better or if they get worse. Visit your doctor or health care professional for regular checks on your progress. Your condition will be monitored carefully while you are receiving this medicine. You may need blood work done while you are taking this medicine. Women should inform their doctor if they wish to become pregnant or think they might be pregnant. There is a potential for serious side effects to an unborn child. Talk to your health care professional or pharmacist for more information. Do not breast-feed an infant while taking this medicine or for 6 months after stopping it. This medicine has caused ovarian failure in some women. This medicine may interfere with the ability to have a child. Talk with your doctor or health care professional if you are concerned about your fertility. What side effects may I notice from receiving this medicine? Side effects that you should report to your doctor or health care professional as soon as possible: -allergic reactions like skin rash, itching or hives, swelling of the face, lips, or tongue -increased blood pressure -severe stomach pain -signs and symptoms of high blood sugar such as dizziness; dry mouth; dry skin; fruity breath; nausea; stomach pain; increased hunger or thirst; increased urination -signs and symptoms of low blood sugar such as feeling anxious; confusion; dizziness; increased hunger; unusually weak or tired; sweating; shakiness; cold; irritable; headache; blurred vision; fast heartbeat; loss of consciousness -unusually slow heartbeat Side effects that usually do not require medical attention (report to your doctor or health care professional if they continue   or are bothersome): -constipation -diarrhea -dizziness -headache -muscle pain -muscle  spasms -nausea -pain, redness, or irritation at site where injected This list may not describe all possible side effects. Call your doctor for medical advice about side effects. You may report side effects to FDA at 1-800-FDA-1088. Where should I keep my medicine? This drug is given in a hospital or clinic and will not be stored at home. NOTE: This sheet is a summary. It may not cover all possible information. If you have questions about this medicine, talk to your doctor, pharmacist, or health care provider.  2018 Elsevier/Gold Standard (2016-07-23 10:33:47)  

## 2018-09-25 LAB — CHROMOGRANIN A: CHROMOGRANIN A: 16 nmol/L — AB (ref 0–5)

## 2018-09-27 ENCOUNTER — Telehealth: Payer: Self-pay | Admitting: Hematology & Oncology

## 2018-09-27 NOTE — Telephone Encounter (Signed)
11/22 °

## 2018-09-27 NOTE — Telephone Encounter (Signed)
Appointments scheduled letter/calendar mailed per 11/

## 2018-10-04 ENCOUNTER — Telehealth: Payer: Self-pay | Admitting: Hematology & Oncology

## 2018-10-04 NOTE — Telephone Encounter (Signed)
I spoke with patient regarding appointment for 12/17 @7 :00 w/ arrival for [facility] PET SCAN

## 2018-10-11 ENCOUNTER — Telehealth: Payer: Self-pay | Admitting: Hematology & Oncology

## 2018-10-11 NOTE — Telephone Encounter (Signed)
Spoke with patient regarding r/s 12/17 appt per 12/11 email provider out of office

## 2018-10-16 ENCOUNTER — Encounter (HOSPITAL_COMMUNITY): Payer: Self-pay | Admitting: Emergency Medicine

## 2018-10-16 ENCOUNTER — Emergency Department (HOSPITAL_COMMUNITY)
Admission: EM | Admit: 2018-10-16 | Discharge: 2018-10-16 | Disposition: A | Discharge status: Home / Self Care | Payer: Medicare HMO | Attending: Emergency Medicine | Admitting: Emergency Medicine

## 2018-10-16 ENCOUNTER — Other Ambulatory Visit: Payer: Self-pay

## 2018-10-16 DIAGNOSIS — Y998 Other external cause status: Secondary | ICD-10-CM | POA: Diagnosis not present

## 2018-10-16 DIAGNOSIS — Z7982 Long term (current) use of aspirin: Secondary | ICD-10-CM | POA: Diagnosis not present

## 2018-10-16 DIAGNOSIS — Z23 Encounter for immunization: Secondary | ICD-10-CM | POA: Diagnosis not present

## 2018-10-16 DIAGNOSIS — Z79899 Other long term (current) drug therapy: Secondary | ICD-10-CM | POA: Diagnosis not present

## 2018-10-16 DIAGNOSIS — E039 Hypothyroidism, unspecified: Secondary | ICD-10-CM | POA: Diagnosis not present

## 2018-10-16 DIAGNOSIS — I2510 Atherosclerotic heart disease of native coronary artery without angina pectoris: Secondary | ICD-10-CM | POA: Diagnosis not present

## 2018-10-16 DIAGNOSIS — Z87891 Personal history of nicotine dependence: Secondary | ICD-10-CM | POA: Insufficient documentation

## 2018-10-16 DIAGNOSIS — S61411A Laceration without foreign body of right hand, initial encounter: Secondary | ICD-10-CM | POA: Insufficient documentation

## 2018-10-16 DIAGNOSIS — W260XXA Contact with knife, initial encounter: Secondary | ICD-10-CM | POA: Insufficient documentation

## 2018-10-16 DIAGNOSIS — Y93G1 Activity, food preparation and clean up: Secondary | ICD-10-CM | POA: Diagnosis not present

## 2018-10-16 DIAGNOSIS — I10 Essential (primary) hypertension: Secondary | ICD-10-CM | POA: Insufficient documentation

## 2018-10-16 DIAGNOSIS — Y929 Unspecified place or not applicable: Secondary | ICD-10-CM | POA: Insufficient documentation

## 2018-10-16 MED ORDER — TETANUS-DIPHTH-ACELL PERTUSSIS 5-2.5-18.5 LF-MCG/0.5 IM SUSP
0.5000 mL | Freq: Once | INTRAMUSCULAR | Status: AC
  Administered 2018-10-16: 0.5 mL via INTRAMUSCULAR
  Filled 2018-10-16: qty 0.5

## 2018-10-16 MED ORDER — BACITRACIN ZINC 500 UNIT/GM EX OINT: 1.0000 "application " | TOPICAL_OINTMENT | Freq: Two times a day (BID) | CUTANEOUS | 0 refills | Status: DC

## 2018-10-16 NOTE — ED Notes (Signed)
Pt c/o nausea and feeling lightheaded. Ginger ale given

## 2018-10-16 NOTE — Discharge Instructions (Addendum)
We saw in the ER for the cut. Unfortunately, it appears that you might have sliced off significant part of your skin -and therefore there is not anything for [place] to suture.  This means that your healing will take longer than usual, and you will have to take great care of the wound itself.  Please read the instructions provided on wound care.  Return to the ER if you start having worsening redness, swelling, pain, fevers.

## 2018-10-16 NOTE — ED Provider Notes (Signed)
[facility] EMERGENCY DEPARTMENT Provider Note   CSN: [id number] Arrival date & time: 10/16/18  0051     History   Chief Complaint Chief Complaint  Patient presents with  . Laceration    HPI Brian Mays is a 72 y.o. male.  HPI 72 year old male comes in with chief complaint of laceration.  Patient was slicing food when he accidentally cut his right hand.  Patient reports that he was initially able to control the bleeding, however the bleeding restarted again and therefore he decided to come to the ER.  Patient has not had a tetanus shot in at least 8 years.  He denies any numbness, tingling.  Patient is not on any blood thinners.  Past Medical History:  Diagnosis Date  . Anemia    in past  . Colon polyps   . Diverticulosis   . Gallstones   . GERD (gastroesophageal reflux disease)   . Heart attack ([place])    mild, Spring 2017  . Heart murmur   . Hemorrhoid   . Hiatal hernia   . HTN (hypertension)   . Hypothyroidism   . Metastatic carcinoma ([place]) 2010   Dr Jonette Eva  . Pancreatitis 1998   chronic    Patient Active Problem List   Diagnosis Date Noted  . Lumbar radiculopathy 08/16/2017  . CAD (coronary artery disease) 09/21/2016  . Unstable angina ([place]) 02/09/2016  . Vitamin D deficiency 03/11/2015  . Metastatic malignant neuroendocrine tumor to liver ([place]) 02/14/2015  . Hyperglycemia 04/03/2012  . Diarrhea 03/29/2012  . B12 deficiency 04/19/2011  . TOBACCO USE, QUIT 10/13/2009  . Unspecified vitamin D deficiency 06/19/2009  . Neoplasm by body site 12/23/2008  . DIVERTICULOSIS, COLON 11/08/2008  . ABDOMINAL PAIN 10/02/2008  . Hypothyroidism 12/13/2007  . HEMORRHOIDS, NOS 12/13/2007  . COLONIC POLYPS, HX OF 12/13/2007  . Essential hypertension 09/15/2007  . GERD 09/15/2007  . PANCREATITIS, CHRONIC 09/15/2007  . CARDIAC MURMUR 09/15/2007  . SNORING 09/15/2007    Past Surgical History:  Procedure Laterality Date  . APPENDECTOMY  1962  .  CARDIAC CATHETERIZATION N/A 02/10/2016   Procedure: Left Heart Cath and Coronary Angiography;  Surgeon: Adrian Prows, MD;  Location: [facility];  Service: Cardiovascular;  Laterality: N/A;  . CARDIAC CATHETERIZATION  02/10/2016   Procedure: Coronary/Graft Atherectomy;  Surgeon: Adrian Prows, MD;  Location: [facility];  Service: Cardiovascular;;  . CARDIAC CATHETERIZATION  02/10/2016   Procedure: Coronary Stent Intervention;  Surgeon: Adrian Prows, MD;  Location: [facility];  Service: Cardiovascular;;  . CATARACT EXTRACTION W/ INTRAOCULAR LENS  IMPLANT, BILATERAL Bilateral   . IR GENERIC HISTORICAL  12/30/2015   IR RADIOLOGIST EVAL & MGMT 12/30/2015 Aletta Edouard, MD [facility] INTERV RAD  . IR GENERIC HISTORICAL  11/09/2016   IR RADIOLOGIST EVAL & MGMT 11/09/2016 Aletta Edouard, MD [facility] INTERV RAD  . IR RADIOLOGIST EVAL & MGMT  03/01/2017  . IR RADIOLOGIST EVAL & MGMT  06/29/2017  . LAPAROSCOPIC CHOLECYSTECTOMY  1999  . LIVER BIOPSY  2010  . RADIOACTIVE SEED IMPLANT  X 3   "to my liver"  . TUMOR EXCISION  01/2009   Carcinoil Resection   . TUMOR REMOVAL     from small intestine        Home Medications    Prior to Admission medications   Medication Sig Start Date End Date Taking? Authorizing Provider  aspirin EC 81 MG tablet Take 81 mg by mouth every morning.  [provider]  atorvastatin (LIPITOR) 20 MG tablet Take 20 mg daily by mouth.    [provider]  bacitracin ointment Apply 1 application topically 2 (two) times daily. 10/16/18   Varney Biles, MD  Cholecalciferol 1000 UNITS tablet Take 1,000 Units by mouth daily.      [provider]  clopidogrel (PLAVIX) 75 MG tablet TAKE 1 TABLET BY MOUTH DAILY 09/08/18   Nahser, Wonda Cheng, MD  fluticasone [facility]) 50 MCG/ACT nasal spray Place 2 sprays into both nostrils daily as needed (sinuses).  01/06/15   [provider]  hydrochlorothiazide (HYDRODIURIL) 25 MG tablet Take 0.5 tablets (12.5 mg  total) by mouth daily. 08/29/18   Nahser, Wonda Cheng, MD  hydrocortisone (ANUSOL-HC) 25 MG suppository INSERT 1 SUPPOSITORY RECTALLY TWICE A DAY AS NEEDED FOR HEMORRHOIDS 02/07/15   [place], Holli Humbles, NP  Lanreotide Acetate (SOMATULINE DEPOT [place]) Inject 120 mcg into the skin every 28 (twenty-eight) days. Receives at Dr Antonieta Pert office    [provider]  lansoprazole (PREVACID) 30 MG capsule Take 1 capsule (30 mg total) by mouth daily at 12 noon. 11/19/16   [place], Gay Filler, MD  levothyroxine (SYNTHROID, LEVOTHROID) 50 MCG tablet Take 1 tablet (50 mcg total) by mouth daily. 08/09/17   [place], Gay Filler, MD  losartan (COZAAR) 100 MG tablet Take 1 tablet (100 mg total) by mouth daily. 08/29/18   Nahser, Wonda Cheng, MD  metoprolol tartrate (LOPRESSOR) 25 MG tablet Take 0.5 tablets (12.5 mg total) 2 (two) times daily by mouth. 09/14/17   Nahser, Wonda Cheng, MD  nitroGLYCERIN (NITROSTAT) 0.4 MG SL tablet Place 1 tablet (0.4 mg total) under the tongue every 5 (five) minutes x 3 doses as needed for chest pain. 06/27/18   Nahser, Wonda Cheng, MD  Omega-3 Fatty Acids (FISH OIL) 1000 MG CAPS Take 1 capsule by mouth daily. Reported on 12/30/2015    [provider]  sodium chloride (OCEAN) 0.65 % SOLN nasal spray Place 1 spray into both nostrils as needed for congestion.     [provider]  vitamin B-12 (CYANOCOBALAMIN) 1000 MCG tablet Take 1,000 mcg by mouth daily.    [provider]    Family History Family History  Problem Relation Age of Onset  . Kidney disease Mother   . Hyperlipidemia Mother   . Hypertension Mother   . COPD Father   . Ulcerative colitis Daughter     Social History Social History   Tobacco Use  . Smoking status: Former Smoker    Packs/day: 1.50    Years: 35.00    Pack years: 52.50    Types: Cigarettes    Start date: 09/25/1959    Last attempt to quit: 12/21/1997    Years since quitting: 20.8  . Smokeless tobacco: Never Used  Substance Use Topics   . Alcohol use: Yes    Alcohol/week: 3.0 - 4.0 standard drinks    Types: 3 - 4 Cans of beer per week    Comment: 4 beers weekly   . Drug use: No     Allergies   Iohexol   Review of Systems Review of Systems  Constitutional: Positive for activity change.  Skin: Positive for wound.  Neurological: Negative for numbness.  Hematological: Bruises/bleeds easily.     Physical Exam Updated Vital Signs BP (!) 155/76 (BP Location: Left Arm)   Pulse 68   Temp 97.7 F (36.5 C) (Oral)   Resp 16   SpO2 100%   Physical Exam Vitals  signs and nursing note reviewed.  Constitutional:      Appearance: He is well-developed.  Pulmonary:     Effort: Pulmonary effort is normal.  Skin:    General: Skin is warm.     Comments: Patient has a superficial laceration with loss of skin over an area of 2 x 3 cm.   Neurological:     Mental Status: He is alert.        ED Treatments / Results  Labs (all labs ordered are listed, but only abnormal results are displayed) Labs Reviewed - No data to display  EKG None  Radiology No results found.  Procedures Procedures (including critical care time)  Medications Ordered in ED Medications  Tdap (BOOSTRIX) injection 0.5 mL (0.5 mLs Intramuscular Given 10/16/18 0357)     Initial Impression / Assessment and Plan / ED Course  I have reviewed the triage vital signs and the nursing notes.  Pertinent labs & imaging results that were available during my care of the patient were reviewed by me and considered in my medical decision making (see chart for details).     72 year old male with no significant medical history comes in with chief complaint of laceration.  Patient is noted to have essentially in ulcerated wound because he sliced off the superficial layers of the dermis. We will update his tetanus. RN is already cleaned the wound. There is still some active oozing going on and will put a quick clot gauze.  Patient has been prescribed  bacitracin and return precautions and wound care discussed.  Final Clinical Impressions(s) / ED Diagnoses   Final diagnoses:  Laceration of right hand, foreign body presence unspecified, initial encounter    ED Discharge Orders         Ordered    bacitracin ointment  2 times daily     10/16/18 0357           Varney Biles, MD 10/16/18 0405

## 2018-10-16 NOTE — ED Triage Notes (Signed)
Pt presents with laceration to palm of hand. Bleeding controlled at this time. Unknown last tetanus.

## 2018-10-17 ENCOUNTER — Encounter (HOSPITAL_COMMUNITY)
Admission: RE | Admit: 2018-10-17 | Discharge: 2018-10-17 | Disposition: A | Discharge status: Home / Self Care | Payer: Medicare HMO | Source: Ambulatory Visit | Attending: Hematology & Oncology | Admitting: Hematology & Oncology

## 2018-10-17 ENCOUNTER — Ambulatory Visit: Payer: Medicare HMO

## 2018-10-17 ENCOUNTER — Ambulatory Visit: Payer: Medicare HMO | Admitting: Hematology & Oncology

## 2018-10-17 ENCOUNTER — Other Ambulatory Visit: Payer: Medicare HMO

## 2018-10-17 DIAGNOSIS — C7A8 Other malignant neuroendocrine tumors: Secondary | ICD-10-CM | POA: Diagnosis not present

## 2018-10-17 DIAGNOSIS — C7B8 Other secondary neuroendocrine tumors: Secondary | ICD-10-CM | POA: Diagnosis not present

## 2018-10-17 DIAGNOSIS — C7A1 Malignant poorly differentiated neuroendocrine tumors: Secondary | ICD-10-CM | POA: Diagnosis not present

## 2018-10-17 IMAGING — CT NM PET NOPR SKULL BASE TO THIGH
1 of 9 series · 1 of 25 positions shown · non-contrast
Comparison: DOTATATE PET scan [DATE]

CLINICAL DATA: Metastatic neuroendocrine tumor. Patient status post
peptide receptor radiotherapy (Lu 177 DOTATATE) with/treatment
[DATE]. Patient currently receiving Sandostatin injections
monthly. Patient reports some fatigue. Minimal diarrhea or flushing.

EXAM:
NUCLEAR MEDICINE PET SKULL BASE TO THIGH
TECHNIQUE: 3.0 mCi Ga 68 DOTATATE was injected intravenously. Full-ring PET
imaging was performed from the skull base to thigh after the
radiotracer. CT data was obtained and used for attenuation
correction and anatomic localization.

[Series 4: ct sk_thigh 5.0 b31f · axial · 5.0mm · 0.98mm/px · 1 of 235 slices shown]
[im 235/235  brain]
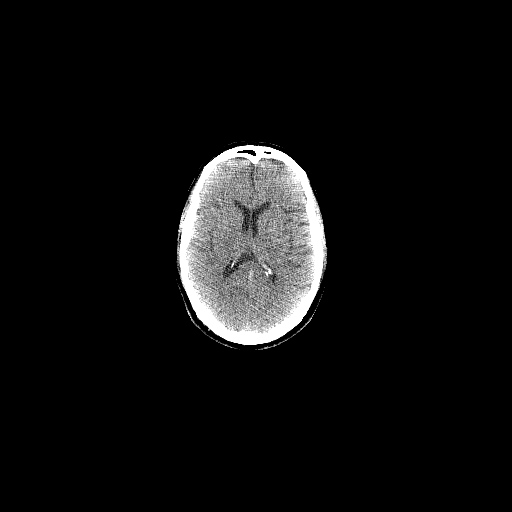

[1 of 25 positions shown; findings below may reference images not displayed]

FINDINGS: NECK

No radiotracer activity in neck lymph nodes.

Incidental CT findings: None

CHEST

No radiotracer accumulation within mediastinal or hilar lymph nodes.
No suspicious pulmonary nodules on the CT scan.

Incidental CT finding:None

ABDOMEN/PELVIS

Again demonstrated multiple hepatic lesions with intense radiotracer
activity. Lesions are mildly decreased in radiotracer activity to
slightly decreased. Lesions are not changed in size.

Example lesion in the lateral LEFT hepatic lobe with SUV max equal
41 decreased from SUV max equal 46. With normalized to splenic
activity, lesion to spleen ratio (L/S) equals 2.1 decreased from
3.2.

Similar reduction activity in the large 9 cm lesion inferior to the
RIGHT hepatic lobe with peripheral activity and central necrosis.
Activity along the RIGHT margin with SUV max equal 37.4 decreased
from 38.1 with L/S ratio 1.8 decreased from 2.6.

No new hepatic lesions.

The central mesenteric partially calcified metastatic deposit
measures 2.5 cm unchanged from 2.5 remeasured. SUV max equal 44
decreased from SUV max equal 51.

No new mesenteric deposits. Lesion associated the large bowel in the
central abdomen described on comparison exam is no longer
identified.

Physiologic activity noted in the liver, spleen, adrenal glands and
kidneys.

Incidental CT findings:None

SKELETON

A solitary lesion in the T12 vertebral body decreased activity with
SUV max equal 9.7 decreased from SUV max equal 13.

Incidental CT findings:None
IMPRESSION: 1. No evidence of new or progressive well differentiated
neuroendocrine tumor. Metastatic lesions are mildly decreased in
radiotracer activity.
2. Mild decrease in radiotracer activity of hepatic metastatic
lesions. No clear change in size. No new lesions.
3. Decrease in activity of central mesenteric partially calcified
mass. No change in size.
4. Decrease in activity of solitary skeletal lesion at T12.

## 2018-10-17 MED ORDER — GALLIUM GA 68 DOTATATE IV KIT
3.0000 | PACK | Freq: Once | INTRAVENOUS | Status: AC
  Administered 2018-10-17: 3 via INTRAVENOUS

## 2018-10-18 ENCOUNTER — Other Ambulatory Visit: Payer: Self-pay

## 2018-10-18 ENCOUNTER — Inpatient Hospital Stay (HOSPITAL_BASED_OUTPATIENT_CLINIC_OR_DEPARTMENT_OTHER): Payer: Medicare HMO | Admitting: Hematology & Oncology

## 2018-10-18 ENCOUNTER — Inpatient Hospital Stay: Payer: Medicare HMO

## 2018-10-18 ENCOUNTER — Inpatient Hospital Stay: Payer: Medicare HMO | Attending: Hematology & Oncology

## 2018-10-18 DIAGNOSIS — C7B8 Other secondary neuroendocrine tumors: Secondary | ICD-10-CM | POA: Insufficient documentation

## 2018-10-18 DIAGNOSIS — C7A8 Other malignant neuroendocrine tumors: Secondary | ICD-10-CM

## 2018-10-18 LAB — CMP (CANCER CENTER ONLY)
ALT: 12 U/L (ref 0–44)
AST: 20 U/L (ref 15–41)
Albumin: 4.1 g/dL (ref 3.5–5.0)
Alkaline Phosphatase: 68 U/L (ref 38–126)
Anion gap: 8 (ref 5–15)
BUN: 18 mg/dL (ref 8–23)
CO2: 32 mmol/L (ref 22–32)
Calcium: 9.5 mg/dL (ref 8.9–10.3)
Chloride: 102 mmol/L (ref 98–111)
Creatinine: 1.06 mg/dL (ref 0.61–1.24)
GFR, Est AFR Am: >60 mL/min (ref >60)
GFR, Estimated: >60 mL/min (ref >60)
Glucose, Bld: 116 mg/dL — ABNORMAL HIGH (ref 70–99)
Potassium: 4.5 mmol/L (ref 3.5–5.1)
Sodium: 142 mmol/L (ref 135–145)
TOTAL PROTEIN: 7.3 g/dL (ref 6.5–8.1)
Total Bilirubin: 0.6 mg/dL (ref 0.3–1.2)

## 2018-10-18 LAB — CBC WITH DIFFERENTIAL (CANCER CENTER ONLY)
Abs Immature Granulocytes: 0.01 10*3/uL (ref 0.00–0.07)
BASOS ABS: 0 10*3/uL (ref 0.0–0.1)
Basophils Relative: 1 %
Eosinophils Absolute: 0.3 10*3/uL (ref 0.0–0.5)
Eosinophils Relative: 6 %
HCT: 39 % (ref 39.0–52.0)
Hemoglobin: 12 g/dL — ABNORMAL LOW (ref 13.0–17.0)
Immature Granulocytes: 0 %
Lymphocytes Relative: 14 %
Lymphs Abs: 0.7 10*3/uL (ref 0.7–4.0)
MCH: 28.5 pg (ref 26.0–34.0)
MCHC: 30.8 g/dL (ref 30.0–36.0)
MCV: 92.6 fL (ref 80.0–100.0)
Monocytes Absolute: 0.4 10*3/uL (ref 0.1–1.0)
Monocytes Relative: 8 %
Neutro Abs: 3.8 10*3/uL (ref 1.7–7.7)
Neutrophils Relative %: 71 %
Platelet Count: 159 10*3/uL (ref 150–400)
RBC: 4.21 MIL/uL — ABNORMAL LOW (ref 4.22–5.81)
RDW: 15.4 % (ref 11.5–15.5)
WBC Count: 5.3 10*3/uL (ref 4.0–10.5)
nRBC: 0 % (ref 0.0–0.2)

## 2018-10-18 MED ORDER — LANREOTIDE ACETATE 120 MG/0.5ML ~~LOC~~ SOLN
SUBCUTANEOUS | Status: AC
  Filled 2018-10-18: qty 120

## 2018-10-18 MED ORDER — LANREOTIDE ACETATE 120 MG/0.5ML ~~LOC~~ SOLN
120.0000 mg | Freq: Once | SUBCUTANEOUS | Status: AC
  Administered 2018-10-18: 120 mg via SUBCUTANEOUS

## 2018-10-18 NOTE — Progress Notes (Signed)
Hematology and Oncology Follow Up Visit  Brian Mays [id number] 28-Sep-1946 72 y.o. 09/05/2017   Principle Diagnosis:  Metastatic low grade neuroendocrine tumor-hepatic metastases  Current Therapy:   S/p yttrium-90 intrahepatic therapy - November 2016 Somatuline 120 mg monthly Lutathera (Lu 177) injection on 02/01/2018 - s/p cycle #4    Interim History: Brian Mays is back for follow-up.  Everything is going pretty well for him right now.  He had a very nice Thanksgiving.  He is looking forward to Christmas.  He is still working part-time.  He has had no issues with nausea or vomiting.  He did have his Lutathera PET scan done yesterday.  Thankfully, the PET scan showed that there was improvement in his activity.  There is no evidence of progressive disease.  The areas of activity that he did have were less active.  This I think is wonderful.  He has had no problems with his bowels or bladder.  He has had no issues with cough or shortness of breath.  He has had no leg swelling.  He has had no rashes.  His last chromogranin A level was 12.  Overall, his performance status is ECOG 1.   Medications:  Allergies as of 09/05/2017      Reactions   Iohexol Hives    Code: HIVES, Desc: PER [name] @ [facility], PT IS ALLERGIC TO CONTRAST DYE 10/02/08/RM  05/01/10...needs full premeds per our protocol w/ [facility]., Onset Date: [id number]      Medication List        Accurate as of 09/05/17 11:45 AM. Always use your most recent med list.          amLODipine 5 MG tablet Commonly known as:  NORVASC Take 5 mg by mouth daily.   aspirin EC 81 MG tablet Take 81 mg by mouth every morning.   atorvastatin 20 MG tablet Commonly known as:  LIPITOR Take 20 mg daily by mouth.   Cholecalciferol 1000 units tablet Take 1,000 Units by mouth daily.   diphenhydrAMINE 50 MG tablet Commonly known as:  BENADRYL Take 1 tablet (50 mg total) by mouth once. Take 1 hour prior to scan.   Fish Oil 1000  MG Caps Take 1 capsule by mouth daily. Reported on 12/30/2015   fluticasone 50 MCG/ACT nasal spray Commonly known as:  FLONASE Place 2 sprays into both nostrils daily as needed (sinuses).   hydrocortisone 25 MG suppository Commonly known as:  ANUSOL-HC INSERT 1 SUPPOSITORY RECTALLY TWICE A DAY AS NEEDED FOR HEMORRHOIDS   lansoprazole 30 MG capsule Commonly known as:  PREVACID Take 1 capsule (30 mg total) by mouth daily at 12 noon.   levothyroxine 50 MCG tablet Commonly known as:  SYNTHROID, LEVOTHROID Take 1 tablet (50 mcg total) by mouth daily.   losartan 100 MG tablet Commonly known as:  COZAAR TAKE 1 TABLET (100 MG TOTAL) BY MOUTH AT BEDTIME.   metoprolol tartrate 25 MG tablet Commonly known as:  LOPRESSOR Take 1 tablet (25 mg total) by mouth 2 (two) times daily.   naproxen 500 MG tablet Commonly known as:  NAPROSYN Take 1 tablet (500 mg total) by mouth 2 (two) times daily with a meal.   nitroGLYCERIN 0.4 MG SL tablet Commonly known as:  NITROSTAT Place 1 tablet (0.4 mg total) under the tongue every 5 (five) minutes x 3 doses as needed for chest pain.   predniSONE 50 MG tablet Commonly known as:  DELTASONE Take 50mg  at 13 hours, 7 hours,  and 1 hour before scan   sodium chloride 0.65 % Soln nasal spray Commonly known as:  OCEAN Place 1 spray into both nostrils as needed for congestion.   SOMATULINE DEPOT [place] Inject 120 mcg into the skin every 28 (twenty-eight) days. Receives at Dr Antonieta Pert office   ticagrelor 90 MG Tabs tablet Commonly known as:  BRILINTA Take 1 tablet (90 mg total) by mouth 2 (two) times daily.   tiZANidine 4 MG tablet Commonly known as:  ZANAFLEX Take 1 tablet (4 mg total) by mouth every 6 (six) hours as needed for muscle spasms.   vitamin B-12 1000 MCG tablet Commonly known as:  CYANOCOBALAMIN Take 1,000 mcg by mouth daily.       Allergies:  Allergies  Allergen Reactions  . Iohexol Hives     Code: HIVES, Desc: PER [name] @ [facility], PT IS ALLERGIC TO CONTRAST DYE 10/02/08/RM  05/01/10...needs full premeds per our protocol w/ [facility]., Onset Date: [id number]     Past Medical History, Surgical history, Social history, and Family History were reviewed and updated.  Review of Systems: Review of Systems  Constitutional: Negative for appetite change, fatigue, fever and unexpected weight change.  HENT:   Negative for lump/mass, mouth sores, sore throat and trouble swallowing.   Respiratory: Negative for cough, hemoptysis and shortness of breath.   Cardiovascular: Negative for leg swelling and palpitations.  Gastrointestinal: Negative for abdominal distention, abdominal pain, blood in stool, constipation, diarrhea, nausea and vomiting.  Genitourinary: Negative for bladder incontinence, dysuria, frequency and hematuria.   Musculoskeletal: Negative for arthralgias, back pain, gait problem and myalgias.  Skin: Negative for itching and rash.  Neurological: Negative for dizziness, extremity weakness, gait problem, headaches, numbness, seizures and speech difficulty.  Hematological: Does not bruise/bleed easily.  Psychiatric/Behavioral: Negative for depression and sleep disturbance. The patient is not nervous/anxious.      Physical Exam:  weight is 203 lb 4 oz (92.2 kg). His oral temperature is 98.3 F (36.8 C). His blood pressure is 113/63 and his pulse is 70. His respiration is 18 and oxygen saturation is 98%.   Wt Readings from Last 3 Encounters:  09/05/17 203 lb 4 oz (92.2 kg)  08/25/17 205 lb (93 kg)  07/25/17 205 lb (93 kg)    Physical Exam Vitals signs reviewed.  HENT:     Head: Normocephalic and atraumatic.  Eyes:     Pupils: Pupils are equal, round, and reactive to light.  Neck:     Musculoskeletal: Normal range of motion.  Cardiovascular:     Rate and Rhythm: Normal rate and regular rhythm.     Heart sounds: Normal heart sounds.  Pulmonary:     Effort: Pulmonary effort is normal.     Breath sounds:  Normal breath sounds.  Abdominal:     General: Bowel sounds are normal.     Palpations: Abdomen is soft.  Musculoskeletal: Normal range of motion.        General: No tenderness or deformity.  Lymphadenopathy:     Cervical: No cervical adenopathy.  Skin:    General: Skin is warm and dry.     Findings: No erythema or rash.  Neurological:     Mental Status: He is alert and oriented to person, place, and time.  Psychiatric:        Behavior: Behavior normal.        Thought Content: Thought content normal.        Judgment: Judgment normal.   .  Lab Results  Component Value Date   WBC 6.4 09/05/2017   HGB 12.9 (L) 09/05/2017   HCT 40.1 09/05/2017   MCV 83 09/05/2017   PLT 142 (L) 09/05/2017   No results found for: FERRITIN, IRON, TIBC, UIBC, IRONPCTSAT Lab Results  Component Value Date   RBC 4.81 09/05/2017   No results found for: KPAFRELGTCHN, LAMBDASER, KAPLAMBRATIO No results found for: IGGSERUM, IGA, IGMSERUM No results found for: Odetta Pink, SPEI   Chemistry      Component Value Date/Time   NA 145 09/05/2017 1015   NA 141 07/23/2016 1255   K 4.2 09/05/2017 1015   K 4.1 07/23/2016 1255   CL 105 09/05/2017 1015   CO2 29 09/05/2017 1015   CO2 27 07/23/2016 1255   BUN 16 09/05/2017 1015   BUN 13.8 07/23/2016 1255   CREATININE 1.1 09/05/2017 1015   CREATININE 1.0 07/23/2016 1255      Component Value Date/Time   CALCIUM 9.9 09/05/2017 1015   CALCIUM 9.2 07/23/2016 1255   ALKPHOS 66 09/05/2017 1015   ALKPHOS 77 07/23/2016 1255   AST 38 09/05/2017 1015   AST 23 07/23/2016 1255   ALT 31 09/05/2017 1015   ALT 13 07/23/2016 1255   BILITOT 1.00 09/05/2017 1015   BILITOT 1.13 07/23/2016 1255     Impression and Plan: Brian Mays is a 72 year old white male. He has metastatic low-grade neuroendocrine carcinoma. He has hepatic metastases.  Given that the [place] PET scan looks better, everything really has gone  well for him.  The Lutathera injections have done a good job.  Hopefully, we will continue to see that he will have a response.  We will go ahead with the Somatuline today.  I will have him come back to see [place] in another 6 weeks now.  I think we can try to move his injections out a little bit longer since he is doing well.  I do not think we need another PET scan on him probably for another 4 months or so.  Burney Gauze, MD  11/5/201811:45 AM

## 2018-10-18 NOTE — Patient Instructions (Signed)
Lanreotide injection What is this medicine? LANREOTIDE (lan REE oh tide) is used to reduce blood levels of growth hormone in patients with a condition called acromegaly. It also works to slow or stop tumor growth in patients with neuroendocrine tumors and treat carcinoid syndrome. This medicine may be used for other purposes; ask your health care provider or pharmacist if you have questions. COMMON BRAND NAME(S): Somatuline [place] What should I tell my health care provider before I take this medicine? They need to know if you have any of these conditions: -diabetes -gallbladder disease -heart disease -kidney disease -liver disease -thyroid disease -an unusual or allergic reaction to lanreotide, other medicines, foods, dyes, or preservatives -pregnant or trying to get pregnant -breast-feeding How should I use this medicine? This medicine is for injection under the skin. It is given by a health care professional in a hospital or clinic setting. Contact your pediatrician or health care professional regarding the use of this medicine in children. Special care may be needed. Overdosage: If you think you have taken too much of this medicine contact a poison control center or emergency room at once. NOTE: This medicine is only for you. Do not share this medicine with others. What if I miss a dose? It is important not to miss your dose. Call your doctor or health care professional if you are unable to keep an appointment. What may interact with this medicine? This medicine may interact with the following medications: -bromocriptine -cyclosporine -certain medicines for blood pressure, heart disease, irregular heart beat -certain medicines for diabetes -quinidine -terfenadine This list may not describe all possible interactions. Give your health care provider a list of all the medicines, herbs, non-prescription drugs, or dietary supplements you use. Also tell them if you smoke, drink alcohol, or  use illegal drugs. Some items may interact with your medicine. What should I watch for while using this medicine? Tell your doctor or healthcare professional if your symptoms do not start to get better or if they get worse. Visit your doctor or health care professional for regular checks on your progress. Your condition will be monitored carefully while you are receiving this medicine. You may need blood work done while you are taking this medicine. Women should inform their doctor if they wish to become pregnant or think they might be pregnant. There is a potential for serious side effects to an unborn child. Talk to your health care professional or pharmacist for more information. Do not breast-feed an infant while taking this medicine or for 6 months after stopping it. This medicine has caused ovarian failure in some women. This medicine may interfere with the ability to have a child. Talk with your doctor or health care professional if you are concerned about your fertility. What side effects may I notice from receiving this medicine? Side effects that you should report to your doctor or health care professional as soon as possible: -allergic reactions like skin rash, itching or hives, swelling of the face, lips, or tongue -increased blood pressure -severe stomach pain -signs and symptoms of high blood sugar such as dizziness; dry mouth; dry skin; fruity breath; nausea; stomach pain; increased hunger or thirst; increased urination -signs and symptoms of low blood sugar such as feeling anxious; confusion; dizziness; increased hunger; unusually weak or tired; sweating; shakiness; cold; irritable; headache; blurred vision; fast heartbeat; loss of consciousness -unusually slow heartbeat Side effects that usually do not require medical attention (report to your doctor or health care professional if they continue   anxious; confusion; dizziness; increased hunger; unusually weak or tired; sweating; shakiness; cold; irritable; headache; blurred vision; fast heartbeat; loss of consciousness  -unusually slow heartbeat  Side effects that usually do not require medical attention (report to your doctor or health care professional if they continue or are bothersome):  -constipation  -diarrhea  -dizziness  -headache  -muscle pain  -muscle  spasms  -nausea  -pain, redness, or irritation at site where injected  This list may not describe all possible side effects. Call your doctor for medical advice about side effects. You may report side effects to FDA at 1-800-FDA-1088.  Where should I keep my medicine?  This drug is given in a hospital or clinic and will not be stored at home.  NOTE: This sheet is a summary. It may not cover all possible information. If you have questions about this medicine, talk to your doctor, pharmacist, or health care provider.   2019 Elsevier/Gold Standard (2016-07-23 10:33:47)

## 2018-10-20 LAB — CHROMOGRANIN A: Chromogranin A: 14 nmol/L — ABNORMAL HIGH (ref 0–5)

## 2018-11-28 ENCOUNTER — Inpatient Hospital Stay: Payer: Medicare HMO | Attending: Hematology & Oncology | Admitting: Hematology & Oncology

## 2018-11-28 ENCOUNTER — Inpatient Hospital Stay: Payer: Medicare HMO

## 2018-11-28 VITALS — BP 137/65 | HR 73 | Temp 98.3°F | Resp 18 | Wt 203.0 lb

## 2018-11-28 DIAGNOSIS — C7B02 Secondary carcinoid tumors of liver: Secondary | ICD-10-CM | POA: Insufficient documentation

## 2018-11-28 DIAGNOSIS — C7A8 Other malignant neuroendocrine tumors: Secondary | ICD-10-CM | POA: Diagnosis not present

## 2018-11-28 DIAGNOSIS — D500 Iron deficiency anemia secondary to blood loss (chronic): Secondary | ICD-10-CM

## 2018-11-28 DIAGNOSIS — C7B8 Other secondary neuroendocrine tumors: Secondary | ICD-10-CM

## 2018-11-28 DIAGNOSIS — Z79899 Other long term (current) drug therapy: Secondary | ICD-10-CM | POA: Diagnosis not present

## 2018-11-28 DIAGNOSIS — Z7982 Long term (current) use of aspirin: Secondary | ICD-10-CM | POA: Insufficient documentation

## 2018-11-28 LAB — CBC WITH DIFFERENTIAL (CANCER CENTER ONLY)
Abs Immature Granulocytes: 0.02 10*3/uL (ref 0.00–0.07)
Basophils Absolute: 0 10*3/uL (ref 0.0–0.1)
Basophils Relative: 1 %
Eosinophils Absolute: 0.3 10*3/uL (ref 0.0–0.5)
Eosinophils Relative: 5 %
HEMATOCRIT: 38.4 % — AB (ref 39.0–52.0)
Hemoglobin: 12 g/dL — ABNORMAL LOW (ref 13.0–17.0)
Immature Granulocytes: 0 %
Lymphocytes Relative: 12 %
Lymphs Abs: 0.7 10*3/uL (ref 0.7–4.0)
MCH: 28.8 pg (ref 26.0–34.0)
MCHC: 31.3 g/dL (ref 30.0–36.0)
MCV: 92.1 fL (ref 80.0–100.0)
Monocytes Absolute: 0.3 10*3/uL (ref 0.1–1.0)
Monocytes Relative: 6 %
Neutro Abs: 4.4 10*3/uL (ref 1.7–7.7)
Neutrophils Relative %: 76 %
Platelet Count: 160 10*3/uL (ref 150–400)
RBC: 4.17 MIL/uL — ABNORMAL LOW (ref 4.22–5.81)
RDW: 15.1 % (ref 11.5–15.5)
WBC Count: 5.8 10*3/uL (ref 4.0–10.5)
nRBC: 0 % (ref 0.0–0.2)

## 2018-11-28 LAB — CMP (CANCER CENTER ONLY)
ALK PHOS: 59 U/L (ref 38–126)
ALT: 13 U/L (ref 0–44)
AST: 21 U/L (ref 15–41)
Albumin: 4.3 g/dL (ref 3.5–5.0)
Anion gap: 9 (ref 5–15)
BUN: 14 mg/dL (ref 8–23)
CALCIUM: 10.1 mg/dL (ref 8.9–10.3)
CO2: 33 mmol/L — AB (ref 22–32)
Chloride: 101 mmol/L (ref 98–111)
Creatinine: 0.98 mg/dL (ref 0.61–1.24)
GFR, Estimated: >60 mL/min (ref >60)
Glucose, Bld: 115 mg/dL — ABNORMAL HIGH (ref 70–99)
Potassium: 3.8 mmol/L (ref 3.5–5.1)
Sodium: 143 mmol/L (ref 135–145)
Total Bilirubin: 0.6 mg/dL (ref 0.3–1.2)
Total Protein: 7.7 g/dL (ref 6.5–8.1)

## 2018-11-28 LAB — LACTATE DEHYDROGENASE: LDH: 192 U/L (ref 98–192)

## 2018-11-28 MED ORDER — LANREOTIDE ACETATE 120 MG/0.5ML ~~LOC~~ SOLN
SUBCUTANEOUS | Status: AC
  Filled 2018-11-28: qty 120

## 2018-11-28 MED ORDER — LANREOTIDE ACETATE 120 MG/0.5ML ~~LOC~~ SOLN
120.0000 mg | Freq: Once | SUBCUTANEOUS | Status: AC
  Administered 2018-11-28: 120 mg via SUBCUTANEOUS

## 2018-11-28 NOTE — Patient Instructions (Signed)
Lanreotide injection What is this medicine? LANREOTIDE (lan REE oh tide) is used to reduce blood levels of growth hormone in patients with a condition called acromegaly. It also works to slow or stop tumor growth in patients with neuroendocrine tumors and treat carcinoid syndrome. This medicine may be used for other purposes; ask your health care provider or pharmacist if you have questions. COMMON BRAND NAME(S): Somatuline [place] What should I tell my health care provider before I take this medicine? They need to know if you have any of these conditions: -diabetes -gallbladder disease -heart disease -kidney disease -liver disease -thyroid disease -an unusual or allergic reaction to lanreotide, other medicines, foods, dyes, or preservatives -pregnant or trying to get pregnant -breast-feeding How should I use this medicine? This medicine is for injection under the skin. It is given by a health care professional in a hospital or clinic setting. Contact your pediatrician or health care professional regarding the use of this medicine in children. Special care may be needed. Overdosage: If you think you have taken too much of this medicine contact a poison control center or emergency room at once. NOTE: This medicine is only for you. Do not share this medicine with others. What if I miss a dose? It is important not to miss your dose. Call your doctor or health care professional if you are unable to keep an appointment. What may interact with this medicine? This medicine may interact with the following medications: -bromocriptine -cyclosporine -certain medicines for blood pressure, heart disease, irregular heart beat -certain medicines for diabetes -quinidine -terfenadine This list may not describe all possible interactions. Give your health care provider a list of all the medicines, herbs, non-prescription drugs, or dietary supplements you use. Also tell them if you smoke, drink alcohol, or  use illegal drugs. Some items may interact with your medicine. What should I watch for while using this medicine? Tell your doctor or healthcare professional if your symptoms do not start to get better or if they get worse. Visit your doctor or health care professional for regular checks on your progress. Your condition will be monitored carefully while you are receiving this medicine. You may need blood work done while you are taking this medicine. Women should inform their doctor if they wish to become pregnant or think they might be pregnant. There is a potential for serious side effects to an unborn child. Talk to your health care professional or pharmacist for more information. Do not breast-feed an infant while taking this medicine or for 6 months after stopping it. This medicine has caused ovarian failure in some women. This medicine may interfere with the ability to have a child. Talk with your doctor or health care professional if you are concerned about your fertility. What side effects may I notice from receiving this medicine? Side effects that you should report to your doctor or health care professional as soon as possible: -allergic reactions like skin rash, itching or hives, swelling of the face, lips, or tongue -increased blood pressure -severe stomach pain -signs and symptoms of high blood sugar such as dizziness; dry mouth; dry skin; fruity breath; nausea; stomach pain; increased hunger or thirst; increased urination -signs and symptoms of low blood sugar such as feeling anxious; confusion; dizziness; increased hunger; unusually weak or tired; sweating; shakiness; cold; irritable; headache; blurred vision; fast heartbeat; loss of consciousness -unusually slow heartbeat Side effects that usually do not require medical attention (report to your doctor or health care professional if they continue   anxious; confusion; dizziness; increased hunger; unusually weak or tired; sweating; shakiness; cold; irritable; headache; blurred vision; fast heartbeat; loss of consciousness  -unusually slow heartbeat  Side effects that usually do not require medical attention (report to your doctor or health care professional if they continue or are bothersome):  -constipation  -diarrhea  -dizziness  -headache  -muscle pain  -muscle  spasms  -nausea  -pain, redness, or irritation at site where injected  This list may not describe all possible side effects. Call your doctor for medical advice about side effects. You may report side effects to FDA at 1-800-FDA-1088.  Where should I keep my medicine?  This drug is given in a hospital or clinic and will not be stored at home.  NOTE: This sheet is a summary. It may not cover all possible information. If you have questions about this medicine, talk to your doctor, pharmacist, or health care provider.   2019 Elsevier/Gold Standard (2016-07-23 10:33:47)

## 2018-11-28 NOTE — Progress Notes (Signed)
Hematology and Oncology Follow Up Visit  Brian Mays [id number] February 07, 1946 73 y.o. 09/05/2017   Principle Diagnosis:  Metastatic low grade neuroendocrine tumor-hepatic metastases  Current Therapy:   S/p yttrium-90 intrahepatic therapy - November 2016 Somatuline 120 mg monthly Lutathera (Lu 177) injection on 02/01/2018 - s/p cycle #4    Interim History: Mr. Torpey is back for follow-up.  He is doing quite well.  He had a very nice Christmas and New Year's.  His wife, unfortunately, broke her right foot.  Hopefully, this will heal up without difficulty.  He has had no problems with abdominal pain.  He has had no change in bowel or bladder habits.  He has had no cough or shortness of breath.  He did see Dr. Liam Rogers of cardiology.  He really liked Dr. Acie Fredrickson.  I am grateful for Dr. Elmarie Shiley experience.  Mr. Oelkers is still working part-time.  He might retire after this year.  He has had no fever.  There is been no bleeding.  Overall, his performance status is ECOG 1.    Medications:  Allergies as of 09/05/2017      Reactions   Iohexol Hives    Code: HIVES, Desc: PER [name] @ PRIMARY CARE, PT IS ALLERGIC TO CONTRAST DYE 10/02/08/RM  05/01/10...needs full premeds per our protocol w/ [facility]., Onset Date: [id number]      Medication List        Accurate as of 09/05/17 11:45 AM. Always use your most recent med list.          amLODipine 5 MG tablet Commonly known as:  NORVASC Take 5 mg by mouth daily.   aspirin EC 81 MG tablet Take 81 mg by mouth every morning.   atorvastatin 20 MG tablet Commonly known as:  LIPITOR Take 20 mg daily by mouth.   Cholecalciferol 1000 units tablet Take 1,000 Units by mouth daily.   diphenhydrAMINE 50 MG tablet Commonly known as:  BENADRYL Take 1 tablet (50 mg total) by mouth once. Take 1 hour prior to scan.   Fish Oil 1000 MG Caps Take 1 capsule by mouth daily. Reported on 12/30/2015   fluticasone 50 MCG/ACT nasal spray Commonly  known as:  FLONASE Place 2 sprays into both nostrils daily as needed (sinuses).   hydrocortisone 25 MG suppository Commonly known as:  ANUSOL-HC INSERT 1 SUPPOSITORY RECTALLY TWICE A DAY AS NEEDED FOR HEMORRHOIDS   lansoprazole 30 MG capsule Commonly known as:  PREVACID Take 1 capsule (30 mg total) by mouth daily at 12 noon.   levothyroxine 50 MCG tablet Commonly known as:  SYNTHROID, LEVOTHROID Take 1 tablet (50 mcg total) by mouth daily.   losartan 100 MG tablet Commonly known as:  COZAAR TAKE 1 TABLET (100 MG TOTAL) BY MOUTH AT BEDTIME.   metoprolol tartrate 25 MG tablet Commonly known as:  LOPRESSOR Take 1 tablet (25 mg total) by mouth 2 (two) times daily.   naproxen 500 MG tablet Commonly known as:  NAPROSYN Take 1 tablet (500 mg total) by mouth 2 (two) times daily with a meal.   nitroGLYCERIN 0.4 MG SL tablet Commonly known as:  NITROSTAT Place 1 tablet (0.4 mg total) under the tongue every 5 (five) minutes x 3 doses as needed for chest pain.   predniSONE 50 MG tablet Commonly known as:  DELTASONE Take 50mg  at 13 hours, 7 hours, and 1 hour before scan   sodium chloride 0.65 % Soln nasal spray Commonly known as:  OCEAN Place 1  spray into both nostrils as needed for congestion.   SOMATULINE DEPOT [place] Inject 120 mcg into the skin every 28 (twenty-eight) days. Receives at Dr Antonieta Pert office   ticagrelor 90 MG Tabs tablet Commonly known as:  BRILINTA Take 1 tablet (90 mg total) by mouth 2 (two) times daily.   tiZANidine 4 MG tablet Commonly known as:  ZANAFLEX Take 1 tablet (4 mg total) by mouth every 6 (six) hours as needed for muscle spasms.   vitamin B-12 1000 MCG tablet Commonly known as:  CYANOCOBALAMIN Take 1,000 mcg by mouth daily.       Allergies:  Allergies  Allergen Reactions  . Iohexol Hives     Code: HIVES, Desc: PER [name] @ PRIMARY [facility]ARE, PT IS ALLERGIC TO CONTRAST DYE 10/02/08/RM  05/01/10...needs full premeds per our protocol w/ [facility].,  Onset Date: [id number]     Past Medical History, Surgical history, Social history, and Family History were reviewed and updated.  Review of Systems: Review of Systems  Constitutional: Negative for appetite change, fatigue, fever and unexpected weight change.  HENT:   Negative for lump/mass, mouth sores, sore throat and trouble swallowing.   Respiratory: Negative for cough, hemoptysis and shortness of breath.   Cardiovascular: Negative for leg swelling and palpitations.  Gastrointestinal: Negative for abdominal distention, abdominal pain, blood in stool, constipation, diarrhea, nausea and vomiting.  Genitourinary: Negative for bladder incontinence, dysuria, frequency and hematuria.   Musculoskeletal: Negative for arthralgias, back pain, gait problem and myalgias.  Skin: Negative for itching and rash.  Neurological: Negative for dizziness, extremity weakness, gait problem, headaches, numbness, seizures and speech difficulty.  Hematological: Does not bruise/bleed easily.  Psychiatric/Behavioral: Negative for depression and sleep disturbance. The patient is not nervous/anxious.      Physical Exam:  weight is 203 lb 4 oz (92.2 kg). His oral temperature is 98.3 F (36.8 C). His blood pressure is 113/63 and his pulse is 70. His respiration is 18 and oxygen saturation is 98%.   Wt Readings from Last 3 Encounters:  09/05/17 203 lb 4 oz (92.2 kg)  08/25/17 205 lb (93 kg)  07/25/17 205 lb (93 kg)    Physical Exam Vitals signs reviewed.  HENT:     Head: Normocephalic and atraumatic.  Eyes:     Pupils: Pupils are equal, round, and reactive to light.  Neck:     Musculoskeletal: Normal range of motion.  Cardiovascular:     Rate and Rhythm: Normal rate and regular rhythm.     Heart sounds: Normal heart sounds.  Pulmonary:     Effort: Pulmonary effort is normal.     Breath sounds: Normal breath sounds.  Abdominal:     General: Bowel sounds are normal.     Palpations: Abdomen is soft.    Musculoskeletal: Normal range of motion.        General: No tenderness or deformity.  Lymphadenopathy:     Cervical: No cervical adenopathy.  Skin:    General: Skin is warm and dry.     Findings: No erythema or rash.  Neurological:     Mental Status: He is alert and oriented to person, place, and time.  Psychiatric:        Behavior: Behavior normal.        Thought Content: Thought content normal.        Judgment: Judgment normal.   .   Lab Results  Component Value Date   WBC 6.4 09/05/2017   HGB 12.9 (L) 09/05/2017  HCT 40.1 09/05/2017   MCV 83 09/05/2017   PLT 142 (L) 09/05/2017   No results found for: FERRITIN, IRON, TIBC, UIBC, IRONPCTSAT Lab Results  Component Value Date   RBC 4.81 09/05/2017   No results found for: KPAFRELGTCHN, LAMBDASER, KAPLAMBRATIO No results found for: IGGSERUM, IGA, IGMSERUM No results found for: Odetta Pink, SPEI   Chemistry      Component Value Date/Time   NA 145 09/05/2017 1015   NA 141 07/23/2016 1255   K 4.2 09/05/2017 1015   K 4.1 07/23/2016 1255   CL 105 09/05/2017 1015   CO2 29 09/05/2017 1015   CO2 27 07/23/2016 1255   BUN 16 09/05/2017 1015   BUN 13.8 07/23/2016 1255   CREATININE 1.1 09/05/2017 1015   CREATININE 1.0 07/23/2016 1255      Component Value Date/Time   CALCIUM 9.9 09/05/2017 1015   CALCIUM 9.2 07/23/2016 1255   ALKPHOS 66 09/05/2017 1015   ALKPHOS 77 07/23/2016 1255   AST 38 09/05/2017 1015   AST 23 07/23/2016 1255   ALT 31 09/05/2017 1015   ALT 13 07/23/2016 1255   BILITOT 1.00 09/05/2017 1015   BILITOT 1.13 07/23/2016 1255     Impression and Plan: Mr. Wishon is a 73 year old white male. He has metastatic low-grade neuroendocrine carcinoma. He has hepatic metastases.  He does not need another Lutathera scan probably until April or May.  We will continue him on the Somatuline.  We will have him come back in 6 weeks.  I think this would be  reasonable for his next Somatuline injection.    Burney Gauze, MD  11/5/201811:45 AM

## 2018-11-29 LAB — CHROMOGRANIN A: Chromogranin A (ng/mL): 167.2 ng/mL — ABNORMAL HIGH (ref 0.0–101.8)

## 2018-12-01 ENCOUNTER — Other Ambulatory Visit: Payer: Self-pay | Admitting: Cardiovascular Disease

## 2019-01-09 ENCOUNTER — Inpatient Hospital Stay: Payer: Medicare HMO | Attending: Hematology & Oncology | Admitting: Hematology & Oncology

## 2019-01-09 ENCOUNTER — Inpatient Hospital Stay: Payer: Medicare HMO

## 2019-01-09 ENCOUNTER — Other Ambulatory Visit: Payer: Self-pay

## 2019-01-09 VITALS — BP 138/63 | HR 68 | Temp 98.0°F | Resp 18 | Wt 210.5 lb

## 2019-01-09 DIAGNOSIS — C7A8 Other malignant neuroendocrine tumors: Secondary | ICD-10-CM | POA: Diagnosis not present

## 2019-01-09 DIAGNOSIS — Z7982 Long term (current) use of aspirin: Secondary | ICD-10-CM

## 2019-01-09 DIAGNOSIS — C7B8 Other secondary neuroendocrine tumors: Secondary | ICD-10-CM

## 2019-01-09 DIAGNOSIS — D500 Iron deficiency anemia secondary to blood loss (chronic): Secondary | ICD-10-CM

## 2019-01-09 LAB — CBC WITH DIFFERENTIAL (CANCER CENTER ONLY)
Abs Immature Granulocytes: 0.02 10*3/uL (ref 0.00–0.07)
Basophils Absolute: 0 10*3/uL (ref 0.0–0.1)
Basophils Relative: 1 %
Eosinophils Absolute: 0.5 10*3/uL (ref 0.0–0.5)
Eosinophils Relative: 8 %
HEMATOCRIT: 36.9 % — AB (ref 39.0–52.0)
Hemoglobin: 11.6 g/dL — ABNORMAL LOW (ref 13.0–17.0)
Immature Granulocytes: 0 %
Lymphocytes Relative: 15 %
Lymphs Abs: 0.8 10*3/uL (ref 0.7–4.0)
MCH: 28.8 pg (ref 26.0–34.0)
MCHC: 31.4 g/dL (ref 30.0–36.0)
MCV: 91.6 fL (ref 80.0–100.0)
Monocytes Absolute: 0.4 10*3/uL (ref 0.1–1.0)
Monocytes Relative: 6 %
NEUTROS ABS: 3.9 10*3/uL (ref 1.7–7.7)
Neutrophils Relative %: 70 %
Platelet Count: 162 10*3/uL (ref 150–400)
RBC: 4.03 MIL/uL — ABNORMAL LOW (ref 4.22–5.81)
RDW: 15.3 % (ref 11.5–15.5)
WBC Count: 5.6 10*3/uL (ref 4.0–10.5)
nRBC: 0 % (ref 0.0–0.2)

## 2019-01-09 LAB — CMP (CANCER CENTER ONLY)
ALT: 12 U/L (ref 0–44)
AST: 20 U/L (ref 15–41)
Albumin: 4.1 g/dL (ref 3.5–5.0)
Alkaline Phosphatase: 50 U/L (ref 38–126)
Anion gap: 8 (ref 5–15)
BILIRUBIN TOTAL: 0.5 mg/dL (ref 0.3–1.2)
BUN: 17 mg/dL (ref 8–23)
CO2: 30 mmol/L (ref 22–32)
Calcium: 9.6 mg/dL (ref 8.9–10.3)
Chloride: 103 mmol/L (ref 98–111)
Creatinine: 1 mg/dL (ref 0.61–1.24)
GFR, Est AFR Am: >60 mL/min (ref >60)
GFR, Estimated: >60 mL/min (ref >60)
Glucose, Bld: 111 mg/dL — ABNORMAL HIGH (ref 70–99)
Potassium: 4.2 mmol/L (ref 3.5–5.1)
Sodium: 141 mmol/L (ref 135–145)
Total Protein: 7.6 g/dL (ref 6.5–8.1)

## 2019-01-09 LAB — FERRITIN: Ferritin: 37 ng/mL (ref 24–336)

## 2019-01-09 LAB — IRON AND TIBC
Iron: 59 ug/dL (ref 42–163)
Saturation Ratios: 15 % — ABNORMAL LOW (ref 20–55)
TIBC: 384 ug/dL (ref 202–409)
UIBC: 325 ug/dL (ref 117–376)

## 2019-01-09 MED ORDER — LANREOTIDE ACETATE 120 MG/0.5ML ~~LOC~~ SOLN
120.0000 mg | Freq: Once | SUBCUTANEOUS | Status: AC
  Administered 2019-01-09: 120 mg via SUBCUTANEOUS

## 2019-01-09 MED ORDER — LANREOTIDE ACETATE 120 MG/0.5ML ~~LOC~~ SOLN
SUBCUTANEOUS | Status: AC
  Filled 2019-01-09: qty 120

## 2019-01-09 NOTE — Progress Notes (Signed)
Hematology and Oncology Follow Up Visit  Brian Mays [id number] [birth year] 73 y.o. 09/05/2017   Principle Diagnosis:  Metastatic low grade neuroendocrine tumor-hepatic metastases  Current Therapy:   S/p yttrium-90 intrahepatic therapy - November 2016 Somatuline 120 mg monthly Lutathera (Lu 177) injection on 02/01/2018 - s/p cycle #4    Interim History: Brian Mays is back for follow-up.  He is doing quite well.  He is gained a little bit of weight.  He is not exercising as he would like.  He is still working.  He works part-time.  He is trying to keep the workload down a little bit.  He has had no abdominal pain.  He has had no cough.  He has had no diarrhea.  There is been no headache.  He has had no fever.  He has had no bleeding.  He has had no issues with his urine.  His blood pressure is doing really well.  Very happy for him with his blood pressure.  Is very pleased with his cardiologist, Dr. Liam Rogers.      Medications:  Allergies as of 09/05/2017      Reactions   Iohexol Hives    Code: HIVES, Desc: PER [name] @ PRIMARY CARE, PT IS ALLERGIC TO CONTRAST DYE 10/02/08/RM  05/01/10...needs full premeds per our protocol w/ [facility]., Onset Date: [id number]      Medication List        Accurate as of 09/05/17 11:45 AM. Always use your most recent med list.          amLODipine 5 MG tablet Commonly known as:  NORVASC Take 5 mg by mouth daily.   aspirin EC 81 MG tablet Take 81 mg by mouth every morning.   atorvastatin 20 MG tablet Commonly known as:  LIPITOR Take 20 mg daily by mouth.   Cholecalciferol 1000 units tablet Take 1,000 Units by mouth daily.   diphenhydrAMINE 50 MG tablet Commonly known as:  BENADRYL Take 1 tablet (50 mg total) by mouth once. Take 1 hour prior to scan.   Fish Oil 1000 MG Caps Take 1 capsule by mouth daily. Reported on 12/30/2015   fluticasone 50 MCG/ACT nasal spray Commonly known as:  FLONASE Place 2 sprays into both nostrils daily  as needed (sinuses).   hydrocortisone 25 MG suppository Commonly known as:  ANUSOL-HC INSERT 1 SUPPOSITORY RECTALLY TWICE A DAY AS NEEDED FOR HEMORRHOIDS   lansoprazole 30 MG capsule Commonly known as:  PREVACID Take 1 capsule (30 mg total) by mouth daily at 12 noon.   levothyroxine 50 MCG tablet Commonly known as:  SYNTHROID, LEVOTHROID Take 1 tablet (50 mcg total) by mouth daily.   losartan 100 MG tablet Commonly known as:  COZAAR TAKE 1 TABLET (100 MG TOTAL) BY MOUTH AT BEDTIME.   metoprolol tartrate 25 MG tablet Commonly known as:  LOPRESSOR Take 1 tablet (25 mg total) by mouth 2 (two) times daily.   naproxen 500 MG tablet Commonly known as:  NAPROSYN Take 1 tablet (500 mg total) by mouth 2 (two) times daily with a meal.   nitroGLYCERIN 0.4 MG SL tablet Commonly known as:  NITROSTAT Place 1 tablet (0.4 mg total) under the tongue every 5 (five) minutes x 3 doses as needed for chest pain.   predniSONE 50 MG tablet Commonly known as:  DELTASONE Take 50mg  at 13 hours, 7 hours, and 1 hour before scan   sodium chloride 0.65 % Soln nasal spray Commonly known as:  [employer]  1 spray into both nostrils as needed for congestion.   SOMATULINE DEPOT [place] Inject 120 mcg into the skin every 28 (twenty-eight) days. Receives at Dr Antonieta Pert office   ticagrelor 90 MG Tabs tablet Commonly known as:  BRILINTA Take 1 tablet (90 mg total) by mouth 2 (two) times daily.   tiZANidine 4 MG tablet Commonly known as:  ZANAFLEX Take 1 tablet (4 mg total) by mouth every 6 (six) hours as needed for muscle spasms.   vitamin B-12 1000 MCG tablet Commonly known as:  CYANOCOBALAMIN Take 1,000 mcg by mouth daily.       Allergies:  Allergies  Allergen Reactions  . Iohexol Hives     Code: HIVES, Desc: PER [name] @ PRIMARY CARE, PT IS ALLERGIC TO CONTRAST DYE 10/02/08/RM  05/01/10...needs full premeds per our protocol w/ [facility]., Onset Date: [id number]     Past Medical History, Surgical  history, Social history, and Family History were reviewed and updated.  Review of Systems: Review of Systems  Constitutional: Negative for appetite change, fatigue, fever and unexpected weight change.  HENT:   Negative for lump/mass, mouth sores, sore throat and trouble swallowing.   Respiratory: Negative for cough, hemoptysis and shortness of breath.   Cardiovascular: Negative for leg swelling and palpitations.  Gastrointestinal: Negative for abdominal distention, abdominal pain, blood in stool, constipation, diarrhea, nausea and vomiting.  Genitourinary: Negative for bladder incontinence, dysuria, frequency and hematuria.   Musculoskeletal: Negative for arthralgias, back pain, gait problem and myalgias.  Skin: Negative for itching and rash.  Neurological: Negative for dizziness, extremity weakness, gait problem, headaches, numbness, seizures and speech difficulty.  Hematological: Does not bruise/bleed easily.  Psychiatric/Behavioral: Negative for depression and sleep disturbance. The patient is not nervous/anxious.      Physical Exam:  weight is 203 lb 4 oz (92.2 kg). His oral temperature is 98.3 F (36.8 C). His blood pressure is 113/63 and his pulse is 70. His respiration is 18 and oxygen saturation is 98%.   Wt Readings from Last 3 Encounters:  09/05/17 203 lb 4 oz (92.2 kg)  08/25/17 205 lb (93 kg)  07/25/17 205 lb (93 kg)    Physical Exam Vitals signs reviewed.  HENT:     Head: Normocephalic and atraumatic.  Eyes:     Pupils: Pupils are equal, round, and reactive to light.  Neck:     Musculoskeletal: Normal range of motion.  Cardiovascular:     Rate and Rhythm: Normal rate and regular rhythm.     Heart sounds: Normal heart sounds.  Pulmonary:     Effort: Pulmonary effort is normal.     Breath sounds: Normal breath sounds.  Abdominal:     General: Bowel sounds are normal.     Palpations: Abdomen is soft.  Musculoskeletal: Normal range of motion.        General: No  tenderness or deformity.  Lymphadenopathy:     Cervical: No cervical adenopathy.  Skin:    General: Skin is warm and dry.     Findings: No erythema or rash.  Neurological:     Mental Status: He is alert and oriented to person, place, and time.  Psychiatric:        Behavior: Behavior normal.        Thought Content: Thought content normal.        Judgment: Judgment normal.   .   Lab Results  Component Value Date   WBC 6.4 09/05/2017   HGB 12.9 (L) 09/05/2017  HCT 40.1 09/05/2017   MCV 83 09/05/2017   PLT 142 (L) 09/05/2017   No results found for: FERRITIN, IRON, TIBC, UIBC, IRONPCTSAT Lab Results  Component Value Date   RBC 4.81 09/05/2017   No results found for: KPAFRELGTCHN, LAMBDASER, KAPLAMBRATIO No results found for: IGGSERUM, IGA, IGMSERUM No results found for: Odetta Pink, SPEI   Chemistry      Component Value Date/Time   NA 145 09/05/2017 1015   NA 141 07/23/2016 1255   K 4.2 09/05/2017 1015   K 4.1 07/23/2016 1255   CL 105 09/05/2017 1015   CO2 29 09/05/2017 1015   CO2 27 07/23/2016 1255   BUN 16 09/05/2017 1015   BUN 13.8 07/23/2016 1255   CREATININE 1.1 09/05/2017 1015   CREATININE 1.0 07/23/2016 1255      Component Value Date/Time   CALCIUM 9.9 09/05/2017 1015   CALCIUM 9.2 07/23/2016 1255   ALKPHOS 66 09/05/2017 1015   ALKPHOS 77 07/23/2016 1255   AST 38 09/05/2017 1015   AST 23 07/23/2016 1255   ALT 31 09/05/2017 1015   ALT 13 07/23/2016 1255   BILITOT 1.00 09/05/2017 1015   BILITOT 1.13 07/23/2016 1255     Impression and Plan: Mr. Vecchio is a 73 year old white male. He has metastatic low-grade neuroendocrine carcinoma. He has hepatic metastases.  I will set him up with another PET scan in a month.  I will plan to see him back a week after the PET scan.  He will get his Somatuline injection.      Burney Gauze, MD  11/5/201811:45 AM

## 2019-01-09 NOTE — Patient Instructions (Signed)
Lanreotide injection What is this medicine? LANREOTIDE (lan REE oh tide) is used to reduce blood levels of growth hormone in patients with a condition called acromegaly. It also works to slow or stop tumor growth in patients with neuroendocrine tumors and treat carcinoid syndrome. This medicine may be used for other purposes; ask your health care provider or pharmacist if you have questions. COMMON BRAND NAME(S): Somatuline [place] What should I tell my health care provider before I take this medicine? They need to know if you have any of these conditions: -diabetes -gallbladder disease -heart disease -kidney disease -liver disease -thyroid disease -an unusual or allergic reaction to lanreotide, other medicines, foods, dyes, or preservatives -pregnant or trying to get pregnant -breast-feeding How should I use this medicine? This medicine is for injection under the skin. It is given by a health care professional in a hospital or clinic setting. Contact your pediatrician or health care professional regarding the use of this medicine in children. Special care may be needed. Overdosage: If you think you have taken too much of this medicine contact a poison control center or emergency room at once. NOTE: This medicine is only for you. Do not share this medicine with others. What if I miss a dose? It is important not to miss your dose. Call your doctor or health care professional if you are unable to keep an appointment. What may interact with this medicine? This medicine may interact with the following medications: -bromocriptine -cyclosporine -certain medicines for blood pressure, heart disease, irregular heart beat -certain medicines for diabetes -quinidine -terfenadine This list may not describe all possible interactions. Give your health care provider a list of all the medicines, herbs, non-prescription drugs, or dietary supplements you use. Also tell them if you smoke, drink alcohol, or  use illegal drugs. Some items may interact with your medicine. What should I watch for while using this medicine? Tell your doctor or healthcare professional if your symptoms do not start to get better or if they get worse. Visit your doctor or health care professional for regular checks on your progress. Your condition will be monitored carefully while you are receiving this medicine. You may need blood work done while you are taking this medicine. Women should inform their doctor if they wish to become pregnant or think they might be pregnant. There is a potential for serious side effects to an unborn child. Talk to your health care professional or pharmacist for more information. Do not breast-feed an infant while taking this medicine or for 6 months after stopping it. This medicine has caused ovarian failure in some women. This medicine may interfere with the ability to have a child. Talk with your doctor or health care professional if you are concerned about your fertility. What side effects may I notice from receiving this medicine? Side effects that you should report to your doctor or health care professional as soon as possible: -allergic reactions like skin rash, itching or hives, swelling of the face, lips, or tongue -increased blood pressure -severe stomach pain -signs and symptoms of high blood sugar such as dizziness; dry mouth; dry skin; fruity breath; nausea; stomach pain; increased hunger or thirst; increased urination -signs and symptoms of low blood sugar such as feeling anxious; confusion; dizziness; increased hunger; unusually weak or tired; sweating; shakiness; cold; irritable; headache; blurred vision; fast heartbeat; loss of consciousness -unusually slow heartbeat Side effects that usually do not require medical attention (report to your doctor or health care professional if they continue   anxious; confusion; dizziness; increased hunger; unusually weak or tired; sweating; shakiness; cold; irritable; headache; blurred vision; fast heartbeat; loss of consciousness  -unusually slow heartbeat  Side effects that usually do not require medical attention (report to your doctor or health care professional if they continue or are bothersome):  -constipation  -diarrhea  -dizziness  -headache  -muscle pain  -muscle  spasms  -nausea  -pain, redness, or irritation at site where injected  This list may not describe all possible side effects. Call your doctor for medical advice about side effects. You may report side effects to FDA at 1-800-FDA-1088.  Where should I keep my medicine?  This drug is given in a hospital or clinic and will not be stored at home.  NOTE: This sheet is a summary. It may not cover all possible information. If you have questions about this medicine, talk to your doctor, pharmacist, or health care provider.   2019 Elsevier/Gold Standard (2016-07-23 10:33:47)

## 2019-01-16 LAB — CHROMOGRANIN A REBASELINE
CHROMOGRANIN A (NG/ML): 213.7 ng/mL — AB (ref 0.0–101.8)
Chromogranin A: 14 nmol/L — ABNORMAL HIGH (ref 0–5)

## 2019-02-05 ENCOUNTER — Encounter: Payer: Self-pay | Admitting: Hematology & Oncology

## 2019-02-06 ENCOUNTER — Telehealth: Payer: Self-pay

## 2019-02-06 ENCOUNTER — Other Ambulatory Visit: Payer: Self-pay | Admitting: Hematology & Oncology

## 2019-02-06 DIAGNOSIS — C7B8 Other secondary neuroendocrine tumors: Secondary | ICD-10-CM

## 2019-02-06 NOTE — Telephone Encounter (Signed)
Result of peer to peer for pt's PET scan was denied. CT was approved with confirmation #[id number] Pt will require premedication d/t ct contrast allergy. dph

## 2019-02-06 NOTE — Telephone Encounter (Signed)
Result of peer to peer for pt's PET scan was denied. CT was approved with confirmation #[id number] Pt will require premedication d/t ct contrast allergy. dph

## 2019-02-09 ENCOUNTER — Ambulatory Visit (HOSPITAL_COMMUNITY): Payer: Medicare HMO

## 2019-02-12 ENCOUNTER — Ambulatory Visit: Payer: Medicare HMO | Admitting: Hematology & Oncology

## 2019-02-12 ENCOUNTER — Ambulatory Visit: Payer: Medicare HMO

## 2019-02-12 ENCOUNTER — Other Ambulatory Visit: Payer: Medicare HMO

## 2019-02-13 ENCOUNTER — Other Ambulatory Visit: Payer: Self-pay | Admitting: Cardiology

## 2019-02-13 DIAGNOSIS — E7800 Pure hypercholesterolemia, unspecified: Secondary | ICD-10-CM

## 2019-02-19 ENCOUNTER — Other Ambulatory Visit: Payer: Self-pay

## 2019-02-19 MED ORDER — METOPROLOL TARTRATE 25 MG PO TABS: 12.5000 mg | ORAL_TABLET | Freq: Two times a day (BID) | ORAL | 3 refills | Status: DC

## 2019-03-02 ENCOUNTER — Encounter: Payer: Self-pay | Admitting: Family Medicine

## 2019-03-02 MED ORDER — LEVOTHYROXINE SODIUM 50 MCG PO TABS: 50.0000 ug | ORAL_TABLET | Freq: Every day | ORAL | 0 refills | Status: DC

## 2019-03-05 ENCOUNTER — Ambulatory Visit (INDEPENDENT_AMBULATORY_CARE_PROVIDER_SITE_OTHER): Payer: Medicare HMO | Admitting: Family Medicine

## 2019-03-05 ENCOUNTER — Encounter: Payer: Self-pay | Admitting: Family Medicine

## 2019-03-05 ENCOUNTER — Other Ambulatory Visit: Payer: Self-pay

## 2019-03-05 DIAGNOSIS — E039 Hypothyroidism, unspecified: Secondary | ICD-10-CM | POA: Diagnosis not present

## 2019-03-05 DIAGNOSIS — J3489 Other specified disorders of nose and nasal sinuses: Secondary | ICD-10-CM | POA: Diagnosis not present

## 2019-03-05 DIAGNOSIS — I10 Essential (primary) hypertension: Secondary | ICD-10-CM

## 2019-03-05 MED ORDER — IPRATROPIUM BROMIDE 0.03 % NA SOLN: 2.0000 | Freq: Three times a day (TID) | NASAL | 6 refills | Status: DC

## 2019-03-05 MED ORDER — LEVOTHYROXINE SODIUM 50 MCG PO TABS: 50.0000 ug | ORAL_TABLET | Freq: Every day | ORAL | 3 refills | Status: DC

## 2019-03-05 NOTE — Progress Notes (Signed)
[facility] at [facility] [address], [place], [place] [address] [phone]  Date:  03/05/2019   Name:  Brian Mays   DOB:  03-03-1946   MRN:  [id number]  PCP:  Darreld Mclean, MD    Chief Complaint: No chief complaint on file.   History of Present Illness:  Brian Mays is a 73 y.o. very pleasant male patient who presents with the following: Pt is at home Provider at office Pt ID confirmed with name and DOB He gives consent for virtual visit today  Last visit here in October for a med check History of CAD, hypertension, metastatic cancer to liver-malignant neuroendocrine tumor  At our last visit I increased his dose of losartan due to elevated blood pressure His most recent oncology visit was in March-he was doing quite well, with a plan for PET scan- this has gotten pushed back due to pandemic.   They plan to do a CT scan later on this month   He is also seeing cardiology, Dr. Acie Fredrickson.  He had a stent in 2017 He is now on 100 mg of losartan and he feels like this controls his BP well  He was able to get our of the time share that was really bothering him and causing a lot of anxiety His mood has been ok, although he is getting tired of staying home with the pandemic  His only real complaint today is that occasionally his nose will run, he seems to have some postnasal drainage.  He is using a generic Flonase type nasal spray when he got a [employer] No history of glaucoma or prostate enlargement   Lab Results  Component Value Date   TSH 2.42 08/14/2018    BP Readings from Last 3 Encounters:  01/09/19 138/63  11/28/18 137/65  10/16/18 (!) 155/76    Patient Active Problem List   Diagnosis Date Noted  . Lumbar radiculopathy 08/16/2017  . CAD (coronary artery disease) 09/21/2016  . Unstable angina ([place]) 02/09/2016  . Vitamin D deficiency 03/11/2015  . Metastatic malignant neuroendocrine tumor to liver ([place])  02/14/2015  . Hyperglycemia 04/03/2012  . Diarrhea 03/29/2012  . B12 deficiency 04/19/2011  . TOBACCO USE, QUIT 10/13/2009  . Unspecified vitamin D deficiency 06/19/2009  . Neoplasm by body site 12/23/2008  . DIVERTICULOSIS, COLON 11/08/2008  . ABDOMINAL PAIN 10/02/2008  . Hypothyroidism 12/13/2007  . HEMORRHOIDS, NOS 12/13/2007  . COLONIC POLYPS, HX OF 12/13/2007  . Essential hypertension 09/15/2007  . GERD 09/15/2007  . PANCREATITIS, CHRONIC 09/15/2007  . CARDIAC MURMUR 09/15/2007  . SNORING 09/15/2007    Past Medical History:  Diagnosis Date  . Anemia    in past  . Colon polyps   . Diverticulosis   . Gallstones   . GERD (gastroesophageal reflux disease)   . Heart attack ([place])    mild, Spring 2017  . Heart murmur   . Hemorrhoid   . Hiatal hernia   . HTN (hypertension)   . Hypothyroidism   . Metastatic carcinoma ([place]) 2010   Dr Jonette Eva  . Pancreatitis 1998   chronic    Past Surgical History:  Procedure Laterality Date  . APPENDECTOMY  1962  . CARDIAC CATHETERIZATION N/A 02/10/2016   Procedure: Left Heart Cath and Coronary Angiography;  Surgeon: Adrian Prows, MD;  Location: [facility];  Service: Cardiovascular;  Laterality: N/A;  . CARDIAC CATHETERIZATION  02/10/2016   Procedure: Coronary/Graft Atherectomy;  Surgeon: Adrian Prows, MD;  Location: [facility];  Service: Cardiovascular;;  . CARDIAC CATHETERIZATION  02/10/2016   Procedure: Coronary Stent Intervention;  Surgeon: Adrian Prows, MD;  Location: [facility];  Service: Cardiovascular;;  . CATARACT EXTRACTION W/ INTRAOCULAR LENS  IMPLANT, BILATERAL Bilateral   . IR GENERIC HISTORICAL  12/30/2015   IR RADIOLOGIST EVAL & MGMT 12/30/2015 Aletta Edouard, MD [facility] INTERV RAD  . IR GENERIC HISTORICAL  11/09/2016   IR RADIOLOGIST EVAL & MGMT 11/09/2016 Aletta Edouard, MD [facility] INTERV RAD  . IR RADIOLOGIST EVAL & MGMT  03/01/2017  . IR RADIOLOGIST EVAL & MGMT  06/29/2017  . LAPAROSCOPIC CHOLECYSTECTOMY  1999  .  LIVER BIOPSY  2010  . RADIOACTIVE SEED IMPLANT  X 3   "to my liver"  . TUMOR EXCISION  01/2009   Carcinoil Resection   . TUMOR REMOVAL     from small intestine    Social History   Tobacco Use  . Smoking status: Former Smoker    Packs/day: 1.50    Years: 35.00    Pack years: 52.50    Types: Cigarettes    Start date: 09/25/1959    Last attempt to quit: 12/21/1997    Years since quitting: 21.2  . Smokeless tobacco: Never Used  Substance Use Topics  . Alcohol use: Yes    Alcohol/week: 3.0 - 4.0 standard drinks    Types: 3 - 4 Cans of beer per week    Comment: 4 beers weekly   . Drug use: No    Family History  Problem Relation Age of Onset  . Kidney disease Mother   . Hyperlipidemia Mother   . Hypertension Mother   . COPD Father   . Ulcerative colitis Daughter     Allergies  Allergen Reactions  . Iohexol Hives     Code: HIVES, Desc: PER [name] @ PRIMARY CARE, PT IS ALLERGIC TO CONTRAST DYE 10/02/08/RM  05/01/10...needs full premeds per our protocol w/ [facility]., Onset Date: [id number]     Medication list has been reviewed and updated.  Current Outpatient Medications on File Prior to Visit  Medication Sig Dispense Refill  . aspirin EC 81 MG tablet Take 81 mg by mouth every morning.    [name] atorvastatin (LIPITOR) 20 MG tablet TAKE 1 TABLET BY MOUTH EVERY DAY **NOTE DOSE DECREASE** 7 tablet 0  . bacitracin ointment Apply 1 application topically 2 (two) times daily. 14 g 0  . Cholecalciferol 1000 UNITS tablet Take 1,000 Units by mouth daily.      . clopidogrel (PLAVIX) 75 MG tablet TAKE 1 TABLET BY MOUTH DAILY 90 tablet 3  . fluticasone (FLONASE) 50 MCG/ACT nasal spray Place 2 sprays into both nostrils daily as needed (sinuses).   11  . hydrochlorothiazide (HYDRODIURIL) 25 MG tablet Take 0.5 tablets (12.5 mg total) by mouth daily. 45 tablet 3  . hydrocortisone (ANUSOL-HC) 25 MG suppository INSERT 1 SUPPOSITORY RECTALLY TWICE A DAY AS NEEDED FOR HEMORRHOIDS 30 suppository 0  .  Lanreotide Acetate (SOMATULINE DEPOT [place]) Inject 120 mcg into the skin every 28 (twenty-eight) days. Receives at Dr Antonieta Pert office    . lansoprazole (PREVACID) 30 MG capsule Take 1 capsule (30 mg total) by mouth daily at 12 noon. 90 capsule 3  . levothyroxine (SYNTHROID) 50 MCG tablet Take 1 tablet (50 mcg total) by mouth daily. 30 tablet 0  . losartan (COZAAR) 100 MG tablet Take 1 tablet (100 mg total) by mouth daily. 90 tablet 3  .  metoprolol tartrate (LOPRESSOR) 25 MG tablet Take 0.5 tablets (12.5 mg total) by mouth 2 (two) times daily. 90 tablet 3  . nitroGLYCERIN (NITROSTAT) 0.4 MG SL tablet PLACE 1 TABLET UNDER THE TONGUE EVERY 5 (FIVE) MINUTES X 3 DOSES AS NEEDED FOR CHEST PAIN. 75 tablet 2  . Omega-3 Fatty Acids (FISH OIL) 1000 MG CAPS Take 1 capsule by mouth daily. Reported on 12/30/2015    . sodium chloride (OCEAN) 0.65 % SOLN nasal spray Place 1 spray into both nostrils as needed for congestion.     . vitamin B-12 (CYANOCOBALAMIN) 1000 MCG tablet Take 1,000 mcg by mouth daily.     No current facility-administered medications on file prior to visit.     Review of Systems:  As per HPI- otherwise negative. No cough or fever He does have occasional sinus drainage   Physical Examination: There were no vitals filed for this visit. There were no vitals filed for this visit. There is no height or weight on file to calculate BMI. Ideal Body Weight:    Pt observed over video.  He looks well- no cough, wheezing or distress Home BP may run 130s/80s  Assessment and Plan: Sinus drainage - Plan: ipratropium (ATROVENT) 0.03 % nasal spray  Essential hypertension, benign  Acquired hypothyroidism - Plan: levothyroxine (SYNTHROID) 50 MCG tablet, TSH  Follow-up visit today.  I called in Atrovent cream to try for sinus drainage and postnasal drip Reports good control blood pressure on current regimen.  He does check his blood pressure at home at times Refill his thyroid medication today.   He needs a TSH, I put order in the computer for him.  He is coming in for an oncology visit later this month, perhaps they can draw a TSH at that time We will see him in the office when feasible It looks like he could use a Pneumovax booster as well  Sent patient the following instructions: It was great to talk to you today, I hope that we can see each other in the office soon! I ordered a TSH for you, the next time you are in for a visit with oncology perhaps they can draw this  I prescribed Atrovent nasal spray for you to try for sinus drainage, I hope it may be helpful.  If not helpful you do not have to continue to use it  Signed Lamar Blinks, MD

## 2019-03-05 NOTE — Patient Instructions (Signed)
It was great to talk to you today, I hope that we can see each other in the office soon! I ordered a TSH for you, the next time you are in for a visit with oncology perhaps they can draw this  I prescribed Atrovent nasal spray for you to try for sinus drainage, I hope it may be helpful.  If not helpful you do not have to continue to use it

## 2019-03-14 ENCOUNTER — Encounter: Payer: Self-pay | Admitting: *Deleted

## 2019-03-14 ENCOUNTER — Inpatient Hospital Stay: Payer: Medicare HMO | Attending: Hematology & Oncology

## 2019-03-14 ENCOUNTER — Inpatient Hospital Stay: Payer: Medicare HMO | Admitting: Hematology & Oncology

## 2019-03-14 ENCOUNTER — Ambulatory Visit (HOSPITAL_BASED_OUTPATIENT_CLINIC_OR_DEPARTMENT_OTHER): Admission: RE | Admit: 2019-03-14 | Payer: Medicare HMO | Source: Ambulatory Visit

## 2019-03-14 ENCOUNTER — Other Ambulatory Visit: Payer: Medicare HMO

## 2019-03-14 ENCOUNTER — Other Ambulatory Visit: Payer: Self-pay | Admitting: *Deleted

## 2019-03-14 ENCOUNTER — Other Ambulatory Visit: Payer: Self-pay

## 2019-03-14 ENCOUNTER — Inpatient Hospital Stay: Payer: Medicare HMO

## 2019-03-14 DIAGNOSIS — C7A8 Other malignant neuroendocrine tumors: Secondary | ICD-10-CM | POA: Insufficient documentation

## 2019-03-14 DIAGNOSIS — D509 Iron deficiency anemia, unspecified: Secondary | ICD-10-CM | POA: Diagnosis not present

## 2019-03-14 DIAGNOSIS — C7B8 Other secondary neuroendocrine tumors: Secondary | ICD-10-CM | POA: Insufficient documentation

## 2019-03-14 LAB — CMP (CANCER CENTER ONLY)
ALT: 14 U/L (ref 0–44)
AST: 22 U/L (ref 15–41)
Albumin: 4.1 g/dL (ref 3.5–5.0)
Alkaline Phosphatase: 65 U/L (ref 38–126)
Anion gap: 7 (ref 5–15)
BUN: 17 mg/dL (ref 8–23)
CO2: 32 mmol/L (ref 22–32)
Calcium: 9.8 mg/dL (ref 8.9–10.3)
Chloride: 104 mmol/L (ref 98–111)
Creatinine: 1.11 mg/dL (ref 0.61–1.24)
GFR, Est AFR Am: >60 mL/min (ref >60)
GFR, Estimated: >60 mL/min (ref >60)
Glucose, Bld: 109 mg/dL — ABNORMAL HIGH (ref 70–99)
Potassium: 4.5 mmol/L (ref 3.5–5.1)
Sodium: 143 mmol/L (ref 135–145)
Total Bilirubin: 0.6 mg/dL (ref 0.3–1.2)
Total Protein: 7.6 g/dL (ref 6.5–8.1)

## 2019-03-14 LAB — CBC WITH DIFFERENTIAL (CANCER CENTER ONLY)
Abs Immature Granulocytes: 0.02 10*3/uL (ref 0.00–0.07)
Basophils Absolute: 0 10*3/uL (ref 0.0–0.1)
Basophils Relative: 1 %
Eosinophils Absolute: 0.3 10*3/uL (ref 0.0–0.5)
Eosinophils Relative: 5 %
HCT: 38.2 % — ABNORMAL LOW (ref 39.0–52.0)
Hemoglobin: 11.8 g/dL — ABNORMAL LOW (ref 13.0–17.0)
Immature Granulocytes: 0 %
Lymphocytes Relative: 18 %
Lymphs Abs: 1 10*3/uL (ref 0.7–4.0)
MCH: 28.4 pg (ref 26.0–34.0)
MCHC: 30.9 g/dL (ref 30.0–36.0)
MCV: 92 fL (ref 80.0–100.0)
Monocytes Absolute: 0.4 10*3/uL (ref 0.1–1.0)
Monocytes Relative: 7 %
Neutro Abs: 3.7 10*3/uL (ref 1.7–7.7)
Neutrophils Relative %: 69 %
Platelet Count: 171 10*3/uL (ref 150–400)
RBC: 4.15 MIL/uL — ABNORMAL LOW (ref 4.22–5.81)
RDW: 15.5 % (ref 11.5–15.5)
WBC Count: 5.4 10*3/uL (ref 4.0–10.5)
nRBC: 0 % (ref 0.0–0.2)

## 2019-03-14 LAB — LACTATE DEHYDROGENASE: LDH: 185 U/L (ref 98–192)

## 2019-03-14 MED ORDER — PREDNISONE 50 MG PO TABS: ORAL_TABLET | ORAL | 0 refills | Status: DC

## 2019-03-14 MED ORDER — DIPHENHYDRAMINE HCL 50 MG PO TABS: 50.0000 mg | ORAL_TABLET | Freq: Once | ORAL | 0 refills | Status: DC

## 2019-03-14 NOTE — Progress Notes (Unsigned)
Patient had appointment today for lab, injection and MD appointment with Dr. Marin Olp.  Patient did not take contrast so CT rescheduled till tomorrow.  Dr. Marin Olp does not have any openings so discussed with Dr. Marin Olp and ok for appointment to be rescheduled till week of May 25.  Schedulers notified.

## 2019-03-15 ENCOUNTER — Encounter (HOSPITAL_BASED_OUTPATIENT_CLINIC_OR_DEPARTMENT_OTHER): Payer: Self-pay

## 2019-03-15 ENCOUNTER — Telehealth: Payer: Self-pay | Admitting: *Deleted

## 2019-03-15 ENCOUNTER — Other Ambulatory Visit: Payer: Self-pay | Admitting: Nurse Practitioner

## 2019-03-15 ENCOUNTER — Ambulatory Visit (HOSPITAL_BASED_OUTPATIENT_CLINIC_OR_DEPARTMENT_OTHER)
Admission: RE | Admit: 2019-03-15 | Discharge: 2019-03-15 | Disposition: A | Discharge status: Home / Self Care | Payer: Medicare HMO | Source: Ambulatory Visit | Attending: Hematology & Oncology | Admitting: Hematology & Oncology

## 2019-03-15 DIAGNOSIS — C7B8 Other secondary neuroendocrine tumors: Secondary | ICD-10-CM | POA: Insufficient documentation

## 2019-03-15 DIAGNOSIS — C787 Secondary malignant neoplasm of liver and intrahepatic bile duct: Secondary | ICD-10-CM | POA: Insufficient documentation

## 2019-03-15 DIAGNOSIS — C7951 Secondary malignant neoplasm of bone: Secondary | ICD-10-CM | POA: Diagnosis not present

## 2019-03-15 DIAGNOSIS — E7800 Pure hypercholesterolemia, unspecified: Secondary | ICD-10-CM

## 2019-03-15 DIAGNOSIS — C7A1 Malignant poorly differentiated neuroendocrine tumors: Secondary | ICD-10-CM | POA: Diagnosis not present

## 2019-03-15 LAB — CHROMOGRANIN A: Chromogranin A (ng/mL): 244.9 ng/mL — ABNORMAL HIGH (ref 0.0–101.8)

## 2019-03-15 IMAGING — CT CT ABDOMEN AND PELVIS WITH CONTRAST
2 of 5 series · 15 of 46 positions shown, 17 images · IV contrast (APPLIED)
Comparison: DOTATATE PET scan [DATE], [DATE], CT scan
[DATE]

CLINICAL DATA: Well differentiated neuroendocrine tumor. Patient
status post peptide receptor radiotherapy completed [DATE].

EXAM:
CT ABDOMEN AND PELVIS WITH CONTRAST
TECHNIQUE: Multidetector CT imaging of the abdomen and pelvis was performed
using the standard protocol following bolus administration of
intravenous contrast.
CONTRAST:  100mL OMNIPAQUE IOHEXOL 300 MG/ML  SOLN

[Series 2: axial st · axial · 0.95mm/px · z∈[-538,-38]mm · 12 of 112 slices shown, 14 images]
[im 6/112  soft-tissue]
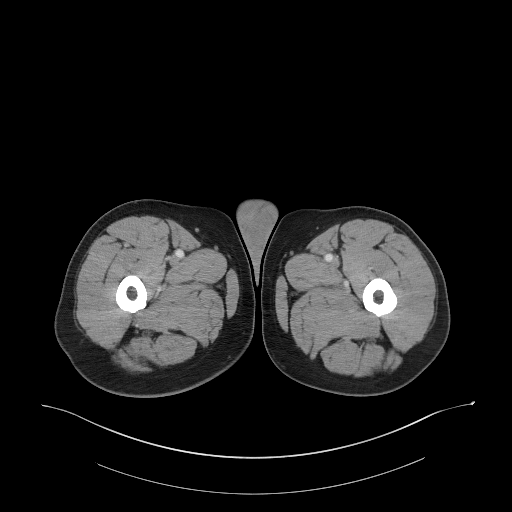
[im 6/112  bone]
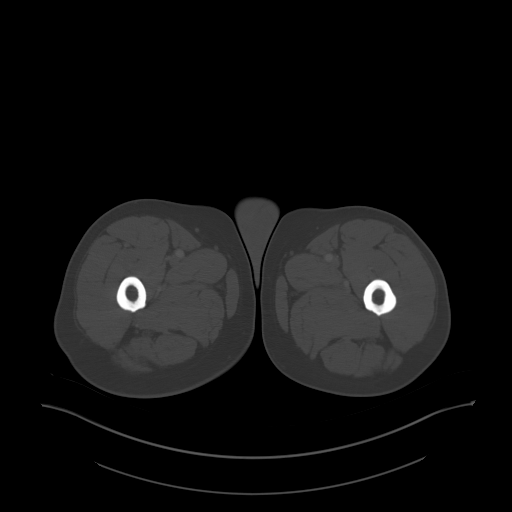
[im 16/112  soft-tissue]
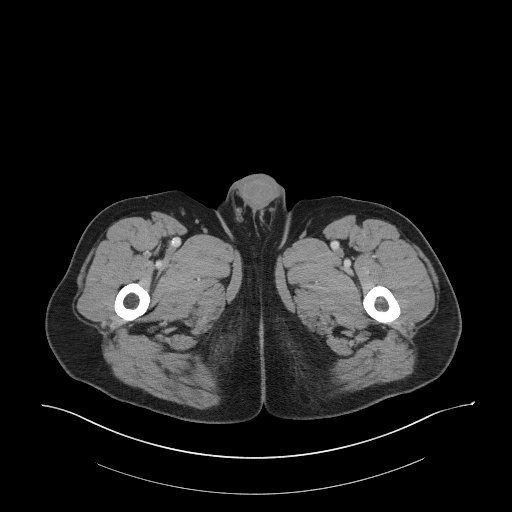
[im 27/112  soft-tissue]
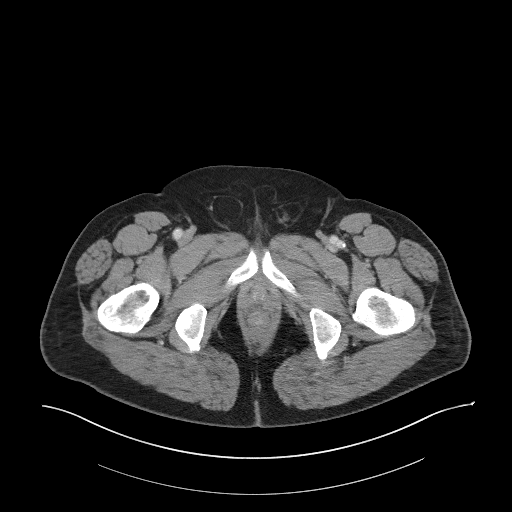
[im 32/112  soft-tissue]
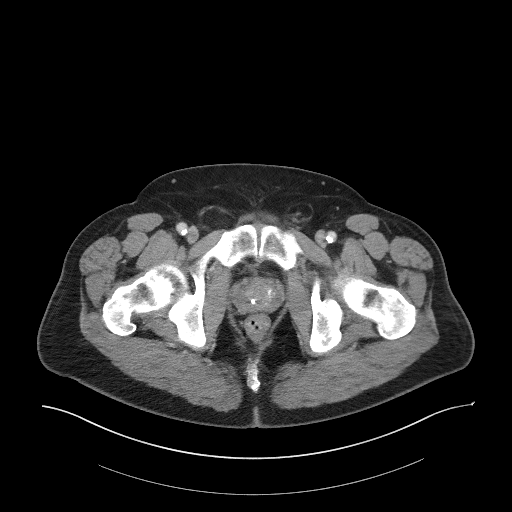
[im 43/112  soft-tissue]
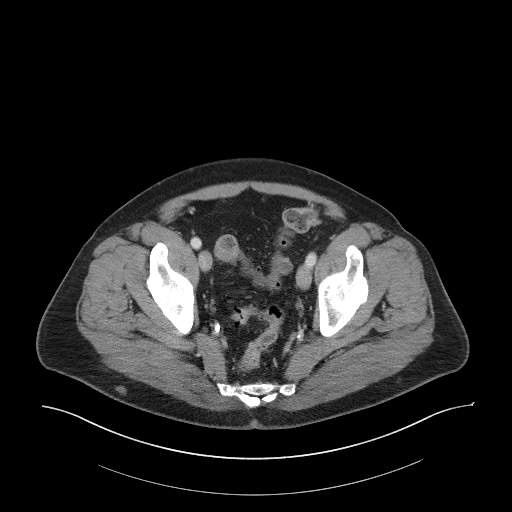
[im 53/112  soft-tissue]
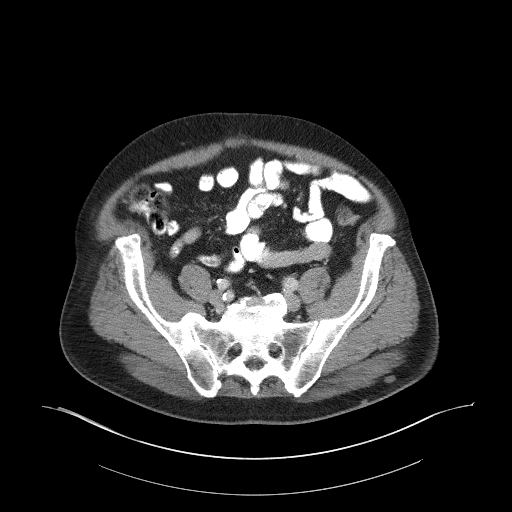
[im 59/112  soft-tissue]
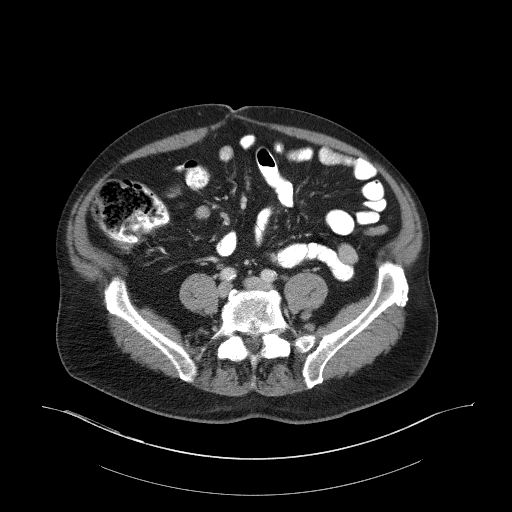
[im 69/112  soft-tissue]
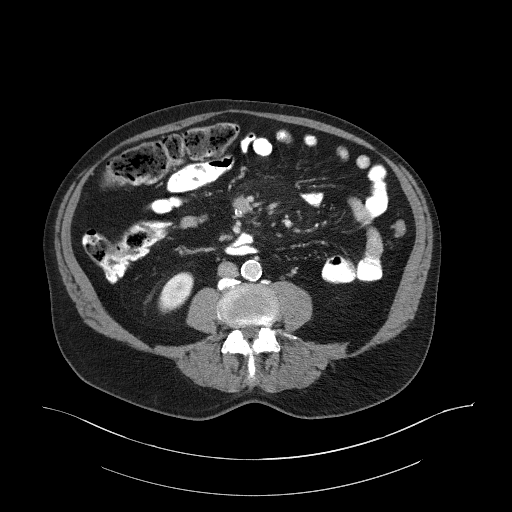
[im 80/112  soft-tissue]
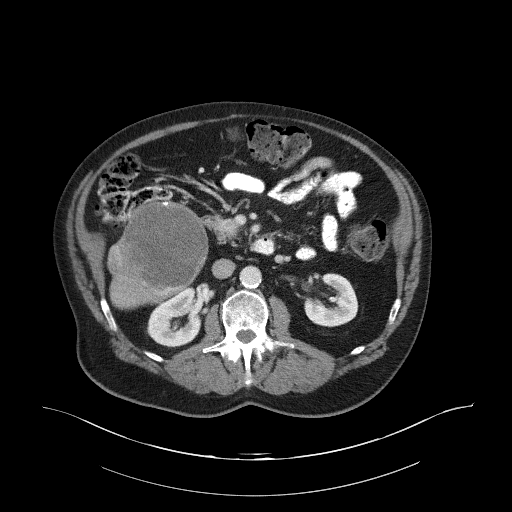
[im 80/112  bone]
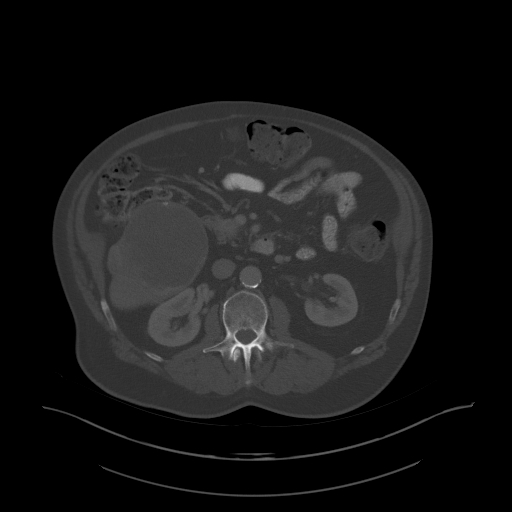
[im 85/112  soft-tissue]
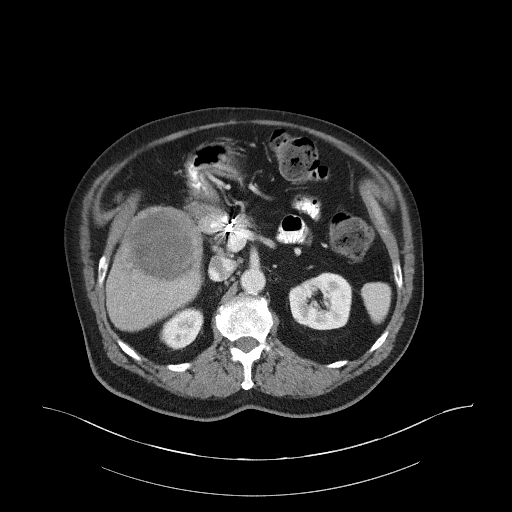
[im 96/112  soft-tissue]
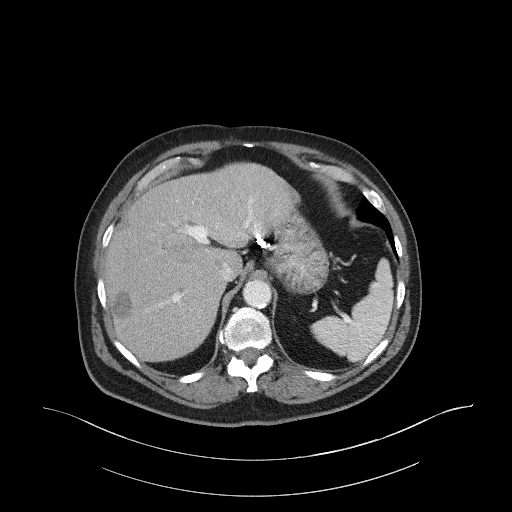
[im 106/112  soft-tissue]
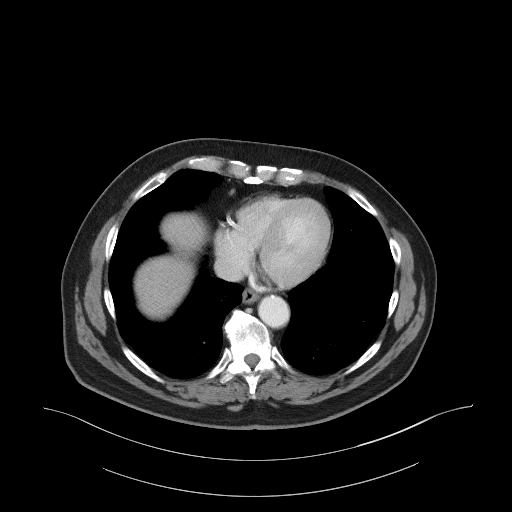

[Series 5: coronal st · coronal · 0.83mm/px · 3 of 106 slices shown]
[im 36/106  soft-tissue]
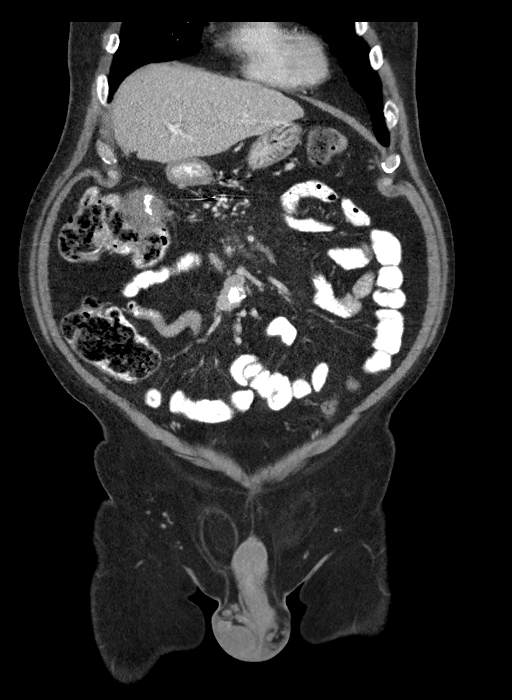
[im 47/106  soft-tissue]
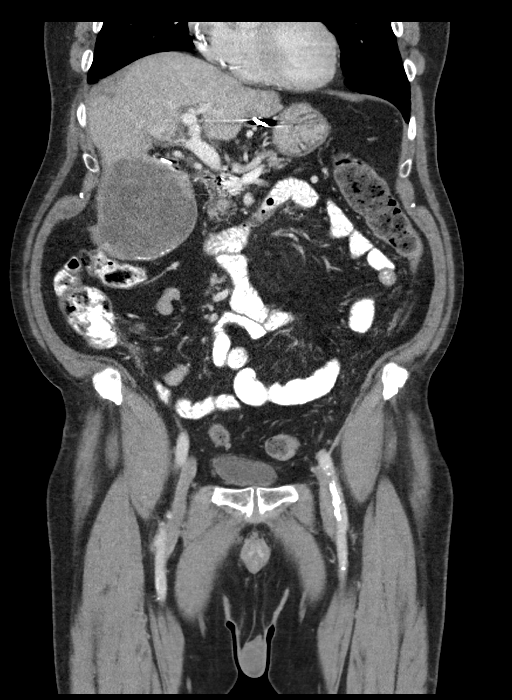
[im 59/106  soft-tissue]
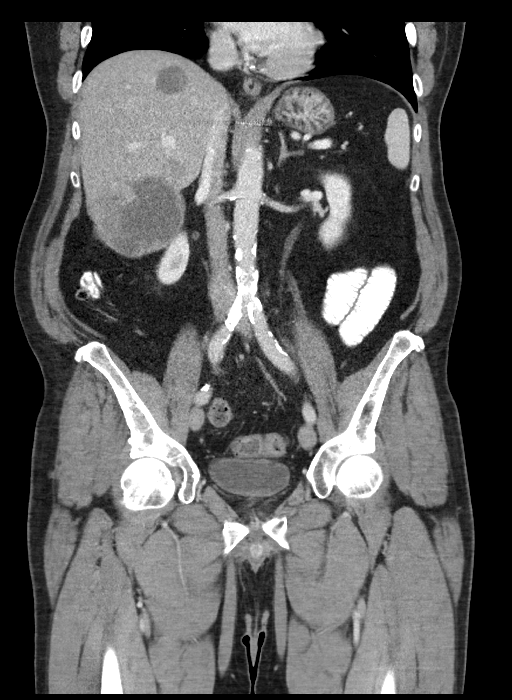

[15 of 46 positions shown; findings below may reference images not displayed]

FINDINGS: Lower chest: Lung bases are clear.

Hepatobiliary: In comparison to the contrast CT [DATE] (prior to
DEGOLLADO peptide receptor radiotherapy treatment), the dominant LEFT
hepatic lobe lesion is decreased measuring 8.2 x 9.2 cm compared to
8.9 x 10.1 cm. Likewise lesion LEFT lateral hepatic lobe measures
4.8 cm decreased from 5.4. No new lesions present. Difficult to
compare to most recent DOTATATE PET scan with noncontrast CT.

The central mesenteric partially calcified mass is also decreased
slightly measuring 2.0 by 2.5 cm compared to 2.9 x 2.6 on CT
[DATE]. This central mesenteric mass measured 2.5 by 2.2 cm on
most recent PET scan ([DATE]) for no significant change.

No new peritoneal disease evident.

Pancreas: Pancreas is normal. No ductal dilatation. No pancreatic
inflammation.

Spleen: Normal spleen

Adrenals/urinary tract: Adrenal glands and kidneys are normal. The
ureters and bladder normal.

Stomach/Bowel: Stomach, small bowel, appendix, and cecum are normal.
The colon and rectosigmoid colon are normal.

Vascular/Lymphatic: Abdominal aorta is normal caliber. No periportal
or retroperitoneal adenopathy. No pelvic adenopathy.

Reproductive: Prostate normal

Other: No peritoneal disease.

Musculoskeletal: The small lesion identified in the T12 vertebral
body on DOTATATE PET scan is not evident by CT exam.
IMPRESSION: 1. No evidence disease progression. Stable hepatic metastasis
compared to most recent DOTATATE PET scan [DATE]. In comparison
to pre peptide receptor radiotherapy CT scan of [DATE] the
hepatic lesions are decreased significantly in size.
2. Stable to decrease in size of central mesenteric mass.
3. No evidence of new or progressive disease in the abdomen pelvis.
4. Small skeletal metastasis T12 is not evident by conventional CT
imaging.

## 2019-03-15 MED ORDER — ATORVASTATIN CALCIUM 20 MG PO TABS: 20.0000 mg | ORAL_TABLET | Freq: Every day | ORAL | 0 refills | Status: DC

## 2019-03-15 MED ORDER — IOHEXOL 300 MG/ML  SOLN
100.0000 mL | Freq: Once | INTRAMUSCULAR | Status: AC | PRN
  Administered 2019-03-15: 11:00:00 100 mL via INTRAVENOUS

## 2019-03-15 NOTE — Telephone Encounter (Signed)
Pt notified per order of Dr. Marin Olp that the CT scan actually shows that his tumor is smaller.  This is a good thing.  The chromogranin A is a little more elevated, not sure what this signifies given the results of the CT scan.  Pt appreciative of call and has no questions at this time.

## 2019-03-15 NOTE — Telephone Encounter (Signed)
-----   Message from Brian Napoleon, MD sent at 03/15/2019  1:11 PM EDT ----- Please call and tell him that the CT scan actually shows that his tumor is smaller.  This is a good thing.  The chromogranin A is little more elevated.  Not sure what this signifies given the results of the CT scan...  Brian Mays

## 2019-03-16 ENCOUNTER — Telehealth: Payer: Self-pay | Admitting: Hematology & Oncology

## 2019-03-16 NOTE — Telephone Encounter (Signed)
Appts scheduled letter/calendar mailed

## 2019-03-29 ENCOUNTER — Inpatient Hospital Stay: Payer: Medicare HMO

## 2019-03-29 ENCOUNTER — Inpatient Hospital Stay (HOSPITAL_BASED_OUTPATIENT_CLINIC_OR_DEPARTMENT_OTHER): Payer: Medicare HMO | Admitting: Hematology & Oncology

## 2019-03-29 ENCOUNTER — Encounter: Payer: Self-pay | Admitting: Hematology & Oncology

## 2019-03-29 ENCOUNTER — Other Ambulatory Visit: Payer: Self-pay

## 2019-03-29 VITALS — BP 121/59 | HR 71 | Temp 97.9°F | Resp 18 | Wt 205.0 lb

## 2019-03-29 DIAGNOSIS — K909 Intestinal malabsorption, unspecified: Secondary | ICD-10-CM

## 2019-03-29 DIAGNOSIS — C7B8 Other secondary neuroendocrine tumors: Secondary | ICD-10-CM

## 2019-03-29 DIAGNOSIS — D500 Iron deficiency anemia secondary to blood loss (chronic): Secondary | ICD-10-CM

## 2019-03-29 DIAGNOSIS — C7A8 Other malignant neuroendocrine tumors: Secondary | ICD-10-CM

## 2019-03-29 DIAGNOSIS — D509 Iron deficiency anemia, unspecified: Secondary | ICD-10-CM | POA: Diagnosis not present

## 2019-03-29 HISTORY — DX: Intestinal malabsorption, unspecified: K90.9

## 2019-03-29 HISTORY — DX: Iron deficiency anemia secondary to blood loss (chronic): D50.0

## 2019-03-29 LAB — CMP (CANCER CENTER ONLY)
ALT: 11 U/L (ref 0–44)
AST: 19 U/L (ref 15–41)
Albumin: 4 g/dL (ref 3.5–5.0)
Alkaline Phosphatase: 53 U/L (ref 38–126)
Anion gap: 7 (ref 5–15)
BUN: 17 mg/dL (ref 8–23)
CO2: 32 mmol/L (ref 22–32)
Calcium: 9.3 mg/dL (ref 8.9–10.3)
Chloride: 103 mmol/L (ref 98–111)
Creatinine: 1.04 mg/dL (ref 0.61–1.24)
GFR, Est AFR Am: >60 mL/min (ref >60)
GFR, Estimated: >60 mL/min (ref >60)
Glucose, Bld: 111 mg/dL — ABNORMAL HIGH (ref 70–99)
Potassium: 4.5 mmol/L (ref 3.5–5.1)
Sodium: 142 mmol/L (ref 135–145)
Total Bilirubin: 0.7 mg/dL (ref 0.3–1.2)
Total Protein: 7.3 g/dL (ref 6.5–8.1)

## 2019-03-29 LAB — CBC WITH DIFFERENTIAL (CANCER CENTER ONLY)
Abs Immature Granulocytes: 0.01 10*3/uL (ref 0.00–0.07)
Basophils Absolute: 0 10*3/uL (ref 0.0–0.1)
Basophils Relative: 1 %
Eosinophils Absolute: 0.3 10*3/uL (ref 0.0–0.5)
Eosinophils Relative: 5 %
HCT: 34 % — ABNORMAL LOW (ref 39.0–52.0)
Hemoglobin: 10.7 g/dL — ABNORMAL LOW (ref 13.0–17.0)
Immature Granulocytes: 0 %
Lymphocytes Relative: 15 %
Lymphs Abs: 0.9 10*3/uL (ref 0.7–4.0)
MCH: 28.5 pg (ref 26.0–34.0)
MCHC: 31.5 g/dL (ref 30.0–36.0)
MCV: 90.7 fL (ref 80.0–100.0)
Monocytes Absolute: 0.4 10*3/uL (ref 0.1–1.0)
Monocytes Relative: 7 %
Neutro Abs: 4.2 10*3/uL (ref 1.7–7.7)
Neutrophils Relative %: 72 %
Platelet Count: 149 10*3/uL — ABNORMAL LOW (ref 150–400)
RBC: 3.75 MIL/uL — ABNORMAL LOW (ref 4.22–5.81)
RDW: 15.3 % (ref 11.5–15.5)
WBC Count: 5.8 10*3/uL (ref 4.0–10.5)
nRBC: 0 % (ref 0.0–0.2)

## 2019-03-29 MED ORDER — LANREOTIDE ACETATE 120 MG/0.5ML ~~LOC~~ SOLN
120.0000 mg | Freq: Once | SUBCUTANEOUS | Status: AC
  Administered 2019-03-29: 120 mg via SUBCUTANEOUS

## 2019-03-29 MED ORDER — LANREOTIDE ACETATE 120 MG/0.5ML ~~LOC~~ SOLN
SUBCUTANEOUS | Status: AC
  Filled 2019-03-29: qty 120

## 2019-03-29 NOTE — Patient Instructions (Signed)
Lanreotide injection What is this medicine? LANREOTIDE (lan REE oh tide) is used to reduce blood levels of growth hormone in patients with a condition called acromegaly. It also works to slow or stop tumor growth in patients with neuroendocrine tumors and treat carcinoid syndrome. This medicine may be used for other purposes; ask your health care provider or pharmacist if you have questions. COMMON BRAND NAME(S): Somatuline [place] What should I tell my health care provider before I take this medicine? They need to know if you have any of these conditions: -diabetes -gallbladder disease -heart disease -kidney disease -liver disease -thyroid disease -an unusual or allergic reaction to lanreotide, other medicines, foods, dyes, or preservatives -pregnant or trying to get pregnant -breast-feeding How should I use this medicine? This medicine is for injection under the skin. It is given by a health care professional in a hospital or clinic setting. Contact your pediatrician or health care professional regarding the use of this medicine in children. Special care may be needed. Overdosage: If you think you have taken too much of this medicine contact a poison control center or emergency room at once. NOTE: This medicine is only for you. Do not share this medicine with others. What if I miss a dose? It is important not to miss your dose. Call your doctor or health care professional if you are unable to keep an appointment. What may interact with this medicine? This medicine may interact with the following medications: -bromocriptine -cyclosporine -certain medicines for blood pressure, heart disease, irregular heart beat -certain medicines for diabetes -quinidine -terfenadine This list may not describe all possible interactions. Give your health care provider a list of all the medicines, herbs, non-prescription drugs, or dietary supplements you use. Also tell them if you smoke, drink alcohol, or  use illegal drugs. Some items may interact with your medicine. What should I watch for while using this medicine? Tell your doctor or healthcare professional if your symptoms do not start to get better or if they get worse. Visit your doctor or health care professional for regular checks on your progress. Your condition will be monitored carefully while you are receiving this medicine. You may need blood work done while you are taking this medicine. Women should inform their doctor if they wish to become pregnant or think they might be pregnant. There is a potential for serious side effects to an unborn child. Talk to your health care professional or pharmacist for more information. Do not breast-feed an infant while taking this medicine or for 6 months after stopping it. This medicine has caused ovarian failure in some women. This medicine may interfere with the ability to have a child. Talk with your doctor or health care professional if you are concerned about your fertility. What side effects may I notice from receiving this medicine? Side effects that you should report to your doctor or health care professional as soon as possible: -allergic reactions like skin rash, itching or hives, swelling of the face, lips, or tongue -increased blood pressure -severe stomach pain -signs and symptoms of high blood sugar such as dizziness; dry mouth; dry skin; fruity breath; nausea; stomach pain; increased hunger or thirst; increased urination -signs and symptoms of low blood sugar such as feeling anxious; confusion; dizziness; increased hunger; unusually weak or tired; sweating; shakiness; cold; irritable; headache; blurred vision; fast heartbeat; loss of consciousness -unusually slow heartbeat Side effects that usually do not require medical attention (report to your doctor or health care professional if they continue   anxious; confusion; dizziness; increased hunger; unusually weak or tired; sweating; shakiness; cold; irritable; headache; blurred vision; fast heartbeat; loss of consciousness  -unusually slow heartbeat  Side effects that usually do not require medical attention (report to your doctor or health care professional if they continue or are bothersome):  -constipation  -diarrhea  -dizziness  -headache  -muscle pain  -muscle  spasms  -nausea  -pain, redness, or irritation at site where injected  This list may not describe all possible side effects. Call your doctor for medical advice about side effects. You may report side effects to FDA at 1-800-FDA-1088.  Where should I keep my medicine?  This drug is given in a hospital or clinic and will not be stored at home.  NOTE: This sheet is a summary. It may not cover all possible information. If you have questions about this medicine, talk to your doctor, pharmacist, or health care provider.   2019 Elsevier/Gold Standard (2016-07-23 10:33:47)

## 2019-03-29 NOTE — Progress Notes (Signed)
Hematology and Oncology Follow Up Visit  Brian Mays [id number] [birth year] 74 y.o. 09/05/2017   Principle Diagnosis:  Metastatic low grade neuroendocrine tumor-hepatic metastases Iron def anemia  Current Therapy:   S/p yttrium-90 intrahepatic therapy - November 2016 Somatuline 120 mg monthly Lutathera (Lu 177) injection on 02/01/2018 - s/p cycle #4 IV Iron w/ Injectafer -- dose given on 03/29/2019    Interim History: Brian Mays is back for follow-up.  He is feeling a little more tired.  I noticed that his hemoglobin was down a little bit.  We did iron studies on him a couple months ago.  His iron level was on the lower side.  His iron saturation was only 15%.  His ferritin was 37.  He has had no obvious bleeding.  He may have some evidence of malabsorption.  I really think that he is going to need some IV iron.  I think this would make him feel better and allow him to have more functional ability.  We did do a CT scan on him.  This was done last week.  Thankfully, the CT scan showed that he had improved disease.  There is no new areas of cancer.  Areas that he had, particularly the mesenteric mass were decreased in size.  He is still isolated because of the coronavirus.  He and his wife really have not been able to do all that much.  Little concerned though is the chromogranin A level.  It was 245 back in May.  Not sure why he was so high given the fact that the CT scan actually looked better.  His appetite is good.  He has had no fever.  He has had no nausea or vomiting.  There is been no diarrhea.  His blood pressure has been doing quite well.  Overall, his performance status is ECOG 0.  Medications:  Allergies as of 09/05/2017      Reactions   Iohexol Hives    Code: HIVES, Desc: PER [name] @ [facility], PT IS ALLERGIC TO CONTRAST DYE 10/02/08/RM  05/01/10...needs full premeds per our protocol w/ [facility]., Onset Date: [id number]      Medication List        Accurate as  of 09/05/17 11:45 AM. Always use your most recent med list.          amLODipine 5 MG tablet Commonly known as:  NORVASC Take 5 mg by mouth daily.   aspirin EC 81 MG tablet Take 81 mg by mouth every morning.   atorvastatin 20 MG tablet Commonly known as:  LIPITOR Take 20 mg daily by mouth.   Cholecalciferol 1000 units tablet Take 1,000 Units by mouth daily.   diphenhydrAMINE 50 MG tablet Commonly known as:  BENADRYL Take 1 tablet (50 mg total) by mouth once. Take 1 hour prior to scan.   Fish Oil 1000 MG Caps Take 1 capsule by mouth daily. Reported on 12/30/2015   fluticasone 50 MCG/ACT nasal spray Commonly known as:  FLONASE Place 2 sprays into both nostrils daily as needed (sinuses).   hydrocortisone 25 MG suppository Commonly known as:  ANUSOL-HC INSERT 1 SUPPOSITORY RECTALLY TWICE A DAY AS NEEDED FOR HEMORRHOIDS   lansoprazole 30 MG capsule Commonly known as:  PREVACID Take 1 capsule (30 mg total) by mouth daily at 12 noon.   levothyroxine 50 MCG tablet Commonly known as:  SYNTHROID, LEVOTHROID Take 1 tablet (50 mcg total) by mouth daily.   losartan 100 MG tablet Commonly known as:  COZAAR TAKE 1 TABLET (100 MG TOTAL) BY MOUTH AT BEDTIME.   metoprolol tartrate 25 MG tablet Commonly known as:  LOPRESSOR Take 1 tablet (25 mg total) by mouth 2 (two) times daily.   naproxen 500 MG tablet Commonly known as:  NAPROSYN Take 1 tablet (500 mg total) by mouth 2 (two) times daily with a meal.   nitroGLYCERIN 0.4 MG SL tablet Commonly known as:  NITROSTAT Place 1 tablet (0.4 mg total) under the tongue every 5 (five) minutes x 3 doses as needed for chest pain.   predniSONE 50 MG tablet Commonly known as:  DELTASONE Take 50mg  at 13 hours, 7 hours, and 1 hour before scan   sodium chloride 0.65 % Soln nasal spray Commonly known as:  OCEAN Place 1 spray into both nostrils as needed for congestion.   SOMATULINE DEPOT [place] Inject 120 mcg into the skin every 28  (twenty-eight) days. Receives at Dr Antonieta Pert office   ticagrelor 90 MG Tabs tablet Commonly known as:  BRILINTA Take 1 tablet (90 mg total) by mouth 2 (two) times daily.   tiZANidine 4 MG tablet Commonly known as:  ZANAFLEX Take 1 tablet (4 mg total) by mouth every 6 (six) hours as needed for muscle spasms.   vitamin B-12 1000 MCG tablet Commonly known as:  CYANOCOBALAMIN Take 1,000 mcg by mouth daily.       Allergies:  Allergies  Allergen Reactions  . Iohexol Hives     Code: HIVES, Desc: PER [name] @ [facility], PT IS ALLERGIC TO CONTRAST DYE 10/02/08/RM  05/01/10...needs full premeds per our protocol w/ [facility]., Onset Date: [id number]     Past Medical History, Surgical history, Social history, and Family History were reviewed and updated.  Review of Systems: Review of Systems  Constitutional: Negative for appetite change, fatigue, fever and unexpected weight change.  HENT:   Negative for lump/mass, mouth sores, sore throat and trouble swallowing.   Respiratory: Negative for cough, hemoptysis and shortness of breath.   Cardiovascular: Negative for leg swelling and palpitations.  Gastrointestinal: Negative for abdominal distention, abdominal pain, blood in stool, constipation, diarrhea, nausea and vomiting.  Genitourinary: Negative for bladder incontinence, dysuria, frequency and hematuria.   Musculoskeletal: Negative for arthralgias, back pain, gait problem and myalgias.  Skin: Negative for itching and rash.  Neurological: Negative for dizziness, extremity weakness, gait problem, headaches, numbness, seizures and speech difficulty.  Hematological: Does not bruise/bleed easily.  Psychiatric/Behavioral: Negative for depression and sleep disturbance. The patient is not nervous/anxious.      Physical Exam:  weight is 203 lb 4 oz (92.2 kg). His oral temperature is 98.3 F (36.8 C). His blood pressure is 113/63 and his pulse is 70. His respiration is 18 and oxygen saturation  is 98%.   Wt Readings from Last 3 Encounters:  09/05/17 203 lb 4 oz (92.2 kg)  08/25/17 205 lb (93 kg)  07/25/17 205 lb (93 kg)    Physical Exam Vitals signs reviewed.  HENT:     Head: Normocephalic and atraumatic.  Eyes:     Pupils: Pupils are equal, round, and reactive to light.  Neck:     Musculoskeletal: Normal range of motion.  Cardiovascular:     Rate and Rhythm: Normal rate and regular rhythm.     Heart sounds: Normal heart sounds.  Pulmonary:     Effort: Pulmonary effort is normal.     Breath sounds: Normal breath sounds.  Abdominal:     General: Bowel sounds are normal.  Palpations: Abdomen is soft.  Musculoskeletal: Normal range of motion.        General: No tenderness or deformity.  Lymphadenopathy:     Cervical: No cervical adenopathy.  Skin:    General: Skin is warm and dry.     Findings: No erythema or rash.  Neurological:     Mental Status: He is alert and oriented to person, place, and time.  Psychiatric:        Behavior: Behavior normal.        Thought Content: Thought content normal.        Judgment: Judgment normal.   .   Lab Results  Component Value Date   WBC 6.4 09/05/2017   HGB 12.9 (L) 09/05/2017   HCT 40.1 09/05/2017   MCV 83 09/05/2017   PLT 142 (L) 09/05/2017   No results found for: FERRITIN, IRON, TIBC, UIBC, IRONPCTSAT Lab Results  Component Value Date   RBC 4.81 09/05/2017   No results found for: KPAFRELGTCHN, LAMBDASER, KAPLAMBRATIO No results found for: IGGSERUM, IGA, IGMSERUM No results found for: Odetta Pink, SPEI   Chemistry      Component Value Date/Time   NA 145 09/05/2017 1015   NA 141 07/23/2016 1255   K 4.2 09/05/2017 1015   K 4.1 07/23/2016 1255   CL 105 09/05/2017 1015   CO2 29 09/05/2017 1015   CO2 27 07/23/2016 1255   BUN 16 09/05/2017 1015   BUN 13.8 07/23/2016 1255   CREATININE 1.1 09/05/2017 1015   CREATININE 1.0 07/23/2016 1255       Component Value Date/Time   CALCIUM 9.9 09/05/2017 1015   CALCIUM 9.2 07/23/2016 1255   ALKPHOS 66 09/05/2017 1015   ALKPHOS 77 07/23/2016 1255   AST 38 09/05/2017 1015   AST 23 07/23/2016 1255   ALT 31 09/05/2017 1015   ALT 13 07/23/2016 1255   BILITOT 1.00 09/05/2017 1015   BILITOT 1.13 07/23/2016 1255     Impression and Plan: Mr. Sharps is a 73 year old white male. He has metastatic low-grade neuroendocrine carcinoma. He has hepatic metastases.  We will have to see what the chromogranin A level is.  I think this is very important for [place].  Again, we will see about giving him IV iron.  I think this is definitely going to be helpful for him.  I would like to see him back in another month or so.  Burney Gauze, MD  11/5/201811:45 AM

## 2019-04-03 LAB — CHROMOGRANIN A: Chromogranin A (ng/mL): 279.6 ng/mL — ABNORMAL HIGH (ref 0.0–101.8)

## 2019-04-05 DIAGNOSIS — C787 Secondary malignant neoplasm of liver and intrahepatic bile duct: Secondary | ICD-10-CM | POA: Diagnosis not present

## 2019-04-05 DIAGNOSIS — I110 Hypertensive heart disease with heart failure: Secondary | ICD-10-CM | POA: Diagnosis not present

## 2019-04-05 DIAGNOSIS — I252 Old myocardial infarction: Secondary | ICD-10-CM | POA: Diagnosis not present

## 2019-04-05 DIAGNOSIS — I25119 Atherosclerotic heart disease of native coronary artery with unspecified angina pectoris: Secondary | ICD-10-CM | POA: Diagnosis not present

## 2019-04-05 DIAGNOSIS — I509 Heart failure, unspecified: Secondary | ICD-10-CM | POA: Diagnosis not present

## 2019-04-05 DIAGNOSIS — K219 Gastro-esophageal reflux disease without esophagitis: Secondary | ICD-10-CM | POA: Diagnosis not present

## 2019-04-05 DIAGNOSIS — E039 Hypothyroidism, unspecified: Secondary | ICD-10-CM | POA: Diagnosis not present

## 2019-04-05 DIAGNOSIS — C260 Malignant neoplasm of intestinal tract, part unspecified: Secondary | ICD-10-CM | POA: Diagnosis not present

## 2019-04-05 DIAGNOSIS — E785 Hyperlipidemia, unspecified: Secondary | ICD-10-CM | POA: Diagnosis not present

## 2019-04-05 DIAGNOSIS — C7951 Secondary malignant neoplasm of bone: Secondary | ICD-10-CM | POA: Diagnosis not present

## 2019-04-23 ENCOUNTER — Encounter: Payer: Self-pay | Admitting: Family Medicine

## 2019-04-24 NOTE — Telephone Encounter (Signed)
It looks like there is lab orders placed by dr. Marin Olp as future orders. Can patient go to [facility] office for labs?

## 2019-04-26 ENCOUNTER — Encounter: Payer: Self-pay | Admitting: Hematology & Oncology

## 2019-04-26 ENCOUNTER — Inpatient Hospital Stay (HOSPITAL_BASED_OUTPATIENT_CLINIC_OR_DEPARTMENT_OTHER): Payer: Medicare HMO | Admitting: Hematology & Oncology

## 2019-04-26 ENCOUNTER — Inpatient Hospital Stay: Payer: Medicare HMO | Attending: Hematology & Oncology

## 2019-04-26 ENCOUNTER — Other Ambulatory Visit: Payer: Self-pay

## 2019-04-26 ENCOUNTER — Inpatient Hospital Stay: Payer: Medicare HMO

## 2019-04-26 VITALS — BP 154/71 | HR 65 | Ht 70.5 in | Wt 207.4 lb

## 2019-04-26 DIAGNOSIS — C7A8 Other malignant neuroendocrine tumors: Secondary | ICD-10-CM | POA: Insufficient documentation

## 2019-04-26 DIAGNOSIS — D500 Iron deficiency anemia secondary to blood loss (chronic): Secondary | ICD-10-CM

## 2019-04-26 DIAGNOSIS — C7B8 Other secondary neuroendocrine tumors: Secondary | ICD-10-CM

## 2019-04-26 DIAGNOSIS — D509 Iron deficiency anemia, unspecified: Secondary | ICD-10-CM

## 2019-04-26 DIAGNOSIS — Z79899 Other long term (current) drug therapy: Secondary | ICD-10-CM | POA: Diagnosis not present

## 2019-04-26 LAB — CMP (CANCER CENTER ONLY)
ALT: 12 U/L (ref 0–44)
AST: 21 U/L (ref 15–41)
Albumin: 3.9 g/dL (ref 3.5–5.0)
Alkaline Phosphatase: 55 U/L (ref 38–126)
Anion gap: 7 (ref 5–15)
BUN: 14 mg/dL (ref 8–23)
CO2: 31 mmol/L (ref 22–32)
Calcium: 9.4 mg/dL (ref 8.9–10.3)
Chloride: 101 mmol/L (ref 98–111)
Creatinine: 0.88 mg/dL (ref 0.61–1.24)
GFR, Est AFR Am: >60 mL/min (ref >60)
GFR, Estimated: >60 mL/min (ref >60)
Glucose, Bld: 90 mg/dL (ref 70–99)
Potassium: 4 mmol/L (ref 3.5–5.1)
Sodium: 139 mmol/L (ref 135–145)
Total Bilirubin: 0.6 mg/dL (ref 0.3–1.2)
Total Protein: 6.7 g/dL (ref 6.5–8.1)

## 2019-04-26 LAB — CBC WITH DIFFERENTIAL (CANCER CENTER ONLY)
Abs Immature Granulocytes: 0.02 10*3/uL (ref 0.00–0.07)
Basophils Absolute: 0 10*3/uL (ref 0.0–0.1)
Basophils Relative: 1 %
Eosinophils Absolute: 0.3 10*3/uL (ref 0.0–0.5)
Eosinophils Relative: 6 %
HCT: 34.7 % — ABNORMAL LOW (ref 39.0–52.0)
Hemoglobin: 11 g/dL — ABNORMAL LOW (ref 13.0–17.0)
Immature Granulocytes: 0 %
Lymphocytes Relative: 20 %
Lymphs Abs: 1.1 10*3/uL (ref 0.7–4.0)
MCH: 28.4 pg (ref 26.0–34.0)
MCHC: 31.7 g/dL (ref 30.0–36.0)
MCV: 89.7 fL (ref 80.0–100.0)
Monocytes Absolute: 0.4 10*3/uL (ref 0.1–1.0)
Monocytes Relative: 8 %
Neutro Abs: 3.4 10*3/uL (ref 1.7–7.7)
Neutrophils Relative %: 65 %
Platelet Count: 152 10*3/uL (ref 150–400)
RBC: 3.87 MIL/uL — ABNORMAL LOW (ref 4.22–5.81)
RDW: 15.1 % (ref 11.5–15.5)
WBC Count: 5.2 10*3/uL (ref 4.0–10.5)
nRBC: 0 % (ref 0.0–0.2)

## 2019-04-26 MED ORDER — LANREOTIDE ACETATE 120 MG/0.5ML ~~LOC~~ SOLN
120.0000 mg | Freq: Once | SUBCUTANEOUS | Status: AC
  Administered 2019-04-26: 120 mg via SUBCUTANEOUS

## 2019-04-26 MED ORDER — LANREOTIDE ACETATE 120 MG/0.5ML ~~LOC~~ SOLN
SUBCUTANEOUS | Status: AC
  Filled 2019-04-26: qty 120

## 2019-04-26 NOTE — Patient Instructions (Signed)
Lanreotide injection What is this medicine? LANREOTIDE (lan REE oh tide) is used to reduce blood levels of growth hormone in patients with a condition called acromegaly. It also works to slow or stop tumor growth in patients with neuroendocrine tumors and treat carcinoid syndrome. This medicine may be used for other purposes; ask your health care provider or pharmacist if you have questions. COMMON BRAND NAME(S): Somatuline [place] What should I tell my health care provider before I take this medicine? They need to know if you have any of these conditions: -diabetes -gallbladder disease -heart disease -kidney disease -liver disease -thyroid disease -an unusual or allergic reaction to lanreotide, other medicines, foods, dyes, or preservatives -pregnant or trying to get pregnant -breast-feeding How should I use this medicine? This medicine is for injection under the skin. It is given by a health care professional in a hospital or clinic setting. Contact your pediatrician or health care professional regarding the use of this medicine in children. Special care may be needed. Overdosage: If you think you have taken too much of this medicine contact a poison control center or emergency room at once. NOTE: This medicine is only for you. Do not share this medicine with others. What if I miss a dose? It is important not to miss your dose. Call your doctor or health care professional if you are unable to keep an appointment. What may interact with this medicine? This medicine may interact with the following medications: -bromocriptine -cyclosporine -certain medicines for blood pressure, heart disease, irregular heart beat -certain medicines for diabetes -quinidine -terfenadine This list may not describe all possible interactions. Give your health care provider a list of all the medicines, herbs, non-prescription drugs, or dietary supplements you use. Also tell them if you smoke, drink alcohol, or  use illegal drugs. Some items may interact with your medicine. What should I watch for while using this medicine? Tell your doctor or healthcare professional if your symptoms do not start to get better or if they get worse. Visit your doctor or health care professional for regular checks on your progress. Your condition will be monitored carefully while you are receiving this medicine. You may need blood work done while you are taking this medicine. Women should inform their doctor if they wish to become pregnant or think they might be pregnant. There is a potential for serious side effects to an unborn child. Talk to your health care professional or pharmacist for more information. Do not breast-feed an infant while taking this medicine or for 6 months after stopping it. This medicine has caused ovarian failure in some women. This medicine may interfere with the ability to have a child. Talk with your doctor or health care professional if you are concerned about your fertility. What side effects may I notice from receiving this medicine? Side effects that you should report to your doctor or health care professional as soon as possible: -allergic reactions like skin rash, itching or hives, swelling of the face, lips, or tongue -increased blood pressure -severe stomach pain -signs and symptoms of high blood sugar such as dizziness; dry mouth; dry skin; fruity breath; nausea; stomach pain; increased hunger or thirst; increased urination -signs and symptoms of low blood sugar such as feeling anxious; confusion; dizziness; increased hunger; unusually weak or tired; sweating; shakiness; cold; irritable; headache; blurred vision; fast heartbeat; loss of consciousness -unusually slow heartbeat Side effects that usually do not require medical attention (report to your doctor or health care professional if they continue   anxious; confusion; dizziness; increased hunger; unusually weak or tired; sweating; shakiness; cold; irritable; headache; blurred vision; fast heartbeat; loss of consciousness  -unusually slow heartbeat  Side effects that usually do not require medical attention (report to your doctor or health care professional if they continue or are bothersome):  -constipation  -diarrhea  -dizziness  -headache  -muscle pain  -muscle  spasms  -nausea  -pain, redness, or irritation at site where injected  This list may not describe all possible side effects. Call your doctor for medical advice about side effects. You may report side effects to FDA at 1-800-FDA-1088.  Where should I keep my medicine?  This drug is given in a hospital or clinic and will not be stored at home.  NOTE: This sheet is a summary. It may not cover all possible information. If you have questions about this medicine, talk to your doctor, pharmacist, or health care provider.   2019 Elsevier/Gold Standard (2016-07-23 10:33:47)

## 2019-04-26 NOTE — Progress Notes (Signed)
Hematology and Oncology Follow Up Visit  Brian Mays [id number] 1946-04-10 73 y.o. 09/05/2017   Principle Diagnosis:  Metastatic low grade neuroendocrine tumor-hepatic metastases Iron def anemia  Current Therapy:   S/p yttrium-90 intrahepatic therapy - November 2016 Somatuline 120 mg monthly Lutathera (Lu 177) injection on 02/01/2018 - s/p cycle #4 IV Iron w/ Injectafer -- dose given on 03/29/2019    Interim History: Brian Mays is back for follow-up.  He is feeling okay.  My concern however is the fact that his chromogranin A level keeps going up.  We last saw him in May he was up to 279.  I know that he had a CT scan that was done back in May that looked okay.  However, there has to be a reason for the elevation of the chromogranin A level.  As such, I think this is a good indicator for a dotatate scan.  He has had a little bit of diarrhea.  This is sort of chronic for him.  He has had no rashes.  He has had no pruritus.  He has had no cough.  There is been no wheezing.  His wife is doing better.  They still have not been able to go to [place].  He has had no problems with rashes.  He has had no urinary issues.  His blood pressure seems to be doing fairly well.  Overall, his performance status is ECOG 0.  Medications:  Allergies as of 09/05/2017      Reactions   Iohexol Hives    Code: HIVES, Desc: PER [name] @ [facility], PT IS ALLERGIC TO CONTRAST DYE 10/02/08/RM  05/01/10...needs full premeds per our protocol w/ [facility]., Onset Date: [id number]      Medication List        Accurate as of 09/05/17 11:45 AM. Always use your most recent med list.          amLODipine 5 MG tablet Commonly known as:  NORVASC Take 5 mg by mouth daily.   aspirin EC 81 MG tablet Take 81 mg by mouth every morning.   atorvastatin 20 MG tablet Commonly known as:  LIPITOR Take 20 mg daily by mouth.   Cholecalciferol 1000 units tablet Take 1,000 Units by mouth daily.    diphenhydrAMINE 50 MG tablet Commonly known as:  BENADRYL Take 1 tablet (50 mg total) by mouth once. Take 1 hour prior to scan.   Fish Oil 1000 MG Caps Take 1 capsule by mouth daily. Reported on 12/30/2015   fluticasone 50 MCG/ACT nasal spray Commonly known as:  FLONASE Place 2 sprays into both nostrils daily as needed (sinuses).   hydrocortisone 25 MG suppository Commonly known as:  ANUSOL-HC INSERT 1 SUPPOSITORY RECTALLY TWICE A DAY AS NEEDED FOR HEMORRHOIDS   lansoprazole 30 MG capsule Commonly known as:  PREVACID Take 1 capsule (30 mg total) by mouth daily at 12 noon.   levothyroxine 50 MCG tablet Commonly known as:  SYNTHROID, LEVOTHROID Take 1 tablet (50 mcg total) by mouth daily.   losartan 100 MG tablet Commonly known as:  COZAAR TAKE 1 TABLET (100 MG TOTAL) BY MOUTH AT BEDTIME.   metoprolol tartrate 25 MG tablet Commonly known as:  LOPRESSOR Take 1 tablet (25 mg total) by mouth 2 (two) times daily.   naproxen 500 MG tablet Commonly known as:  NAPROSYN Take 1 tablet (500 mg total) by mouth 2 (two) times daily with a meal.   nitroGLYCERIN 0.4 MG SL tablet Commonly known as:  NITROSTAT Place 1 tablet (0.4 mg total) under the tongue every 5 (five) minutes x 3 doses as needed for chest pain.   predniSONE 50 MG tablet Commonly known as:  DELTASONE Take 50mg  at 13 hours, 7 hours, and 1 hour before scan   sodium chloride 0.65 % Soln nasal spray Commonly known as:  OCEAN Place 1 spray into both nostrils as needed for congestion.   SOMATULINE DEPOT [place] Inject 120 mcg into the skin every 28 (twenty-eight) days. Receives at Dr Antonieta Pert office   ticagrelor 90 MG Tabs tablet Commonly known as:  BRILINTA Take 1 tablet (90 mg total) by mouth 2 (two) times daily.   tiZANidine 4 MG tablet Commonly known as:  ZANAFLEX Take 1 tablet (4 mg total) by mouth every 6 (six) hours as needed for muscle spasms.   vitamin B-12 1000 MCG tablet Commonly known as:  CYANOCOBALAMIN  Take 1,000 mcg by mouth daily.       Allergies:  Allergies  Allergen Reactions  . Iohexol Hives     Code: HIVES, Desc: PER [name] @ [facility], PT IS ALLERGIC TO CONTRAST DYE 10/02/08/RM  05/01/10...needs full premeds per our protocol w/ [facility]., Onset Date: [id number]     Past Medical History, Surgical history, Social history, and Family History were reviewed and updated.  Review of Systems: Review of Systems  Constitutional: Negative for appetite change, fatigue, fever and unexpected weight change.  HENT:   Negative for lump/mass, mouth sores, sore throat and trouble swallowing.   Respiratory: Negative for cough, hemoptysis and shortness of breath.   Cardiovascular: Negative for leg swelling and palpitations.  Gastrointestinal: Negative for abdominal distention, abdominal pain, blood in stool, constipation, diarrhea, nausea and vomiting.  Genitourinary: Negative for bladder incontinence, dysuria, frequency and hematuria.   Musculoskeletal: Negative for arthralgias, back pain, gait problem and myalgias.  Skin: Negative for itching and rash.  Neurological: Negative for dizziness, extremity weakness, gait problem, headaches, numbness, seizures and speech difficulty.  Hematological: Does not bruise/bleed easily.  Psychiatric/Behavioral: Negative for depression and sleep disturbance. The patient is not nervous/anxious.      Physical Exam:  weight is 203 lb 4 oz (92.2 kg). His oral temperature is 98.3 F (36.8 C). His blood pressure is 113/63 and his pulse is 70. His respiration is 18 and oxygen saturation is 98%.   Wt Readings from Last 3 Encounters:  09/05/17 203 lb 4 oz (92.2 kg)  08/25/17 205 lb (93 kg)  07/25/17 205 lb (93 kg)    Physical Exam Vitals signs reviewed.  HENT:     Head: Normocephalic and atraumatic.  Eyes:     Pupils: Pupils are equal, round, and reactive to light.  Neck:     Musculoskeletal: Normal range of motion.  Cardiovascular:     Rate and  Rhythm: Normal rate and regular rhythm.     Heart sounds: Normal heart sounds.  Pulmonary:     Effort: Pulmonary effort is normal.     Breath sounds: Normal breath sounds.  Abdominal:     General: Bowel sounds are normal.     Palpations: Abdomen is soft.  Musculoskeletal: Normal range of motion.        General: No tenderness or deformity.  Lymphadenopathy:     Cervical: No cervical adenopathy.  Skin:    General: Skin is warm and dry.     Findings: No erythema or rash.  Neurological:     Mental Status: He is alert and oriented to person, place,  and time.  Psychiatric:        Behavior: Behavior normal.        Thought Content: Thought content normal.        Judgment: Judgment normal.   .   Lab Results  Component Value Date   WBC 6.4 09/05/2017   HGB 12.9 (L) 09/05/2017   HCT 40.1 09/05/2017   MCV 83 09/05/2017   PLT 142 (L) 09/05/2017   No results found for: FERRITIN, IRON, TIBC, UIBC, IRONPCTSAT Lab Results  Component Value Date   RBC 4.81 09/05/2017   No results found for: KPAFRELGTCHN, LAMBDASER, KAPLAMBRATIO No results found for: IGGSERUM, IGA, IGMSERUM No results found for: Odetta Pink, SPEI   Chemistry      Component Value Date/Time   NA 145 09/05/2017 1015   NA 141 07/23/2016 1255   K 4.2 09/05/2017 1015   K 4.1 07/23/2016 1255   CL 105 09/05/2017 1015   CO2 29 09/05/2017 1015   CO2 27 07/23/2016 1255   BUN 16 09/05/2017 1015   BUN 13.8 07/23/2016 1255   CREATININE 1.1 09/05/2017 1015   CREATININE 1.0 07/23/2016 1255      Component Value Date/Time   CALCIUM 9.9 09/05/2017 1015   CALCIUM 9.2 07/23/2016 1255   ALKPHOS 66 09/05/2017 1015   ALKPHOS 77 07/23/2016 1255   AST 38 09/05/2017 1015   AST 23 07/23/2016 1255   ALT 31 09/05/2017 1015   ALT 13 07/23/2016 1255   BILITOT 1.00 09/05/2017 1015   BILITOT 1.13 07/23/2016 1255     Impression and Plan: Mr. Mccormac is a 73 year old white male. He has  metastatic low-grade neuroendocrine carcinoma. He has hepatic metastases.  I will try to set him up with the dotatate scan.  Hopefully, his insurance will allow [place] to do this.  I think this is critical as we have to see what kind of activity he has that is contributing to the rise in his chromogranin A level.  I will plan to see him back in another month or so.  Burney Gauze, MD  11/5/201811:45 AM

## 2019-04-27 ENCOUNTER — Other Ambulatory Visit: Payer: Medicare HMO

## 2019-04-27 ENCOUNTER — Ambulatory Visit: Payer: Medicare HMO | Admitting: Hematology & Oncology

## 2019-04-27 ENCOUNTER — Ambulatory Visit: Payer: Medicare HMO

## 2019-04-27 LAB — IRON AND TIBC
Iron: 56 ug/dL (ref 42–163)
Saturation Ratios: 14 % — ABNORMAL LOW (ref 20–55)
TIBC: 393 ug/dL (ref 202–409)
UIBC: 336 ug/dL (ref 117–376)

## 2019-04-27 LAB — FERRITIN: Ferritin: 34 ng/mL (ref 24–336)

## 2019-04-28 LAB — CHROMOGRANIN A: Chromogranin A (ng/mL): 195.5 ng/mL — ABNORMAL HIGH (ref 0.0–101.8)

## 2019-04-30 ENCOUNTER — Telehealth: Payer: Self-pay | Admitting: Hematology & Oncology

## 2019-04-30 ENCOUNTER — Other Ambulatory Visit: Payer: Self-pay

## 2019-04-30 ENCOUNTER — Inpatient Hospital Stay: Payer: Medicare HMO

## 2019-04-30 VITALS — BP 110/65 | HR 66 | Temp 98.6°F | Resp 17

## 2019-04-30 MED ORDER — SODIUM CHLORIDE 0.9 % IV SOLN: 750.0000 mg | Freq: Once | INTRAVENOUS | Status: DC

## 2019-04-30 NOTE — Telephone Encounter (Signed)
Called and spoke with patinet regarding appointment added for today @ 12:30 per 6/29 result note

## 2019-05-01 ENCOUNTER — Telehealth: Payer: Self-pay | Admitting: Hematology & Oncology

## 2019-05-01 ENCOUNTER — Other Ambulatory Visit: Payer: Self-pay | Admitting: Family

## 2019-05-01 NOTE — Telephone Encounter (Signed)
Called and spoke with patient regarding appointment added for iron per 6/25 result note

## 2019-05-02 ENCOUNTER — Other Ambulatory Visit: Payer: Self-pay | Admitting: Hematology & Oncology

## 2019-05-02 ENCOUNTER — Inpatient Hospital Stay: Payer: Medicare HMO | Attending: Hematology & Oncology

## 2019-05-02 ENCOUNTER — Other Ambulatory Visit: Payer: Self-pay

## 2019-05-02 VITALS — BP 144/71 | HR 58 | Temp 97.7°F | Resp 17

## 2019-05-02 DIAGNOSIS — C7A8 Other malignant neuroendocrine tumors: Secondary | ICD-10-CM | POA: Insufficient documentation

## 2019-05-02 DIAGNOSIS — D500 Iron deficiency anemia secondary to blood loss (chronic): Secondary | ICD-10-CM

## 2019-05-02 DIAGNOSIS — D509 Iron deficiency anemia, unspecified: Secondary | ICD-10-CM | POA: Diagnosis not present

## 2019-05-02 DIAGNOSIS — C7B8 Other secondary neuroendocrine tumors: Secondary | ICD-10-CM | POA: Diagnosis present

## 2019-05-02 MED ORDER — SODIUM CHLORIDE 0.9 % IV SOLN
750.0000 mg | Freq: Once | INTRAVENOUS | Status: AC
  Administered 2019-05-02: 750 mg via INTRAVENOUS
  Filled 2019-05-02: qty 15

## 2019-05-02 MED ORDER — SODIUM CHLORIDE 0.9 % IV SOLN
Freq: Once | INTRAVENOUS | Status: AC
  Administered 2019-05-02: 11:00:00 via INTRAVENOUS
  Filled 2019-05-02: qty 250

## 2019-05-02 NOTE — Patient Instructions (Signed)

## 2019-05-10 ENCOUNTER — Encounter: Payer: Self-pay | Admitting: Family Medicine

## 2019-05-20 ENCOUNTER — Encounter: Payer: Self-pay | Admitting: Hematology & Oncology

## 2019-05-24 ENCOUNTER — Inpatient Hospital Stay: Payer: Medicare HMO

## 2019-05-24 ENCOUNTER — Inpatient Hospital Stay (HOSPITAL_BASED_OUTPATIENT_CLINIC_OR_DEPARTMENT_OTHER): Payer: Medicare HMO | Admitting: Hematology & Oncology

## 2019-05-24 ENCOUNTER — Encounter: Payer: Self-pay | Admitting: Hematology & Oncology

## 2019-05-24 ENCOUNTER — Other Ambulatory Visit: Payer: Self-pay

## 2019-05-24 VITALS — BP 118/63 | HR 66 | Temp 96.0°F | Resp 18 | Ht 70.5 in | Wt 205.0 lb

## 2019-05-24 DIAGNOSIS — C7B8 Other secondary neuroendocrine tumors: Secondary | ICD-10-CM | POA: Diagnosis not present

## 2019-05-24 DIAGNOSIS — C7A8 Other malignant neuroendocrine tumors: Secondary | ICD-10-CM | POA: Diagnosis not present

## 2019-05-24 DIAGNOSIS — D509 Iron deficiency anemia, unspecified: Secondary | ICD-10-CM | POA: Diagnosis not present

## 2019-05-24 DIAGNOSIS — D500 Iron deficiency anemia secondary to blood loss (chronic): Secondary | ICD-10-CM

## 2019-05-24 LAB — CBC WITH DIFFERENTIAL (CANCER CENTER ONLY)
Abs Immature Granulocytes: 0.02 10*3/uL (ref 0.00–0.07)
Basophils Absolute: 0 10*3/uL (ref 0.0–0.1)
Basophils Relative: 1 %
Eosinophils Absolute: 0.3 10*3/uL (ref 0.0–0.5)
Eosinophils Relative: 5 %
HCT: 37.2 % — ABNORMAL LOW (ref 39.0–52.0)
Hemoglobin: 11.8 g/dL — ABNORMAL LOW (ref 13.0–17.0)
Immature Granulocytes: 0 %
Lymphocytes Relative: 19 %
Lymphs Abs: 1.1 10*3/uL (ref 0.7–4.0)
MCH: 29.1 pg (ref 26.0–34.0)
MCHC: 31.7 g/dL (ref 30.0–36.0)
MCV: 91.6 fL (ref 80.0–100.0)
Monocytes Absolute: 0.4 10*3/uL (ref 0.1–1.0)
Monocytes Relative: 8 %
Neutro Abs: 3.9 10*3/uL (ref 1.7–7.7)
Neutrophils Relative %: 67 %
Platelet Count: 138 10*3/uL — ABNORMAL LOW (ref 150–400)
RBC: 4.06 MIL/uL — ABNORMAL LOW (ref 4.22–5.81)
RDW: 17.3 % — ABNORMAL HIGH (ref 11.5–15.5)
WBC Count: 5.8 10*3/uL (ref 4.0–10.5)
nRBC: 0 % (ref 0.0–0.2)

## 2019-05-24 LAB — CMP (CANCER CENTER ONLY)
ALT: 14 U/L (ref 0–44)
AST: 23 U/L (ref 15–41)
Albumin: 4 g/dL (ref 3.5–5.0)
Alkaline Phosphatase: 57 U/L (ref 38–126)
Anion gap: 7 (ref 5–15)
BUN: 19 mg/dL (ref 8–23)
CO2: 30 mmol/L (ref 22–32)
Calcium: 8.2 mg/dL — ABNORMAL LOW (ref 8.9–10.3)
Chloride: 103 mmol/L (ref 98–111)
Creatinine: 0.99 mg/dL (ref 0.61–1.24)
GFR, Est AFR Am: >60 mL/min (ref >60)
GFR, Estimated: >60 mL/min (ref >60)
Glucose, Bld: 109 mg/dL — ABNORMAL HIGH (ref 70–99)
Potassium: 4 mmol/L (ref 3.5–5.1)
Sodium: 140 mmol/L (ref 135–145)
Total Bilirubin: 0.8 mg/dL (ref 0.3–1.2)
Total Protein: 6.7 g/dL (ref 6.5–8.1)

## 2019-05-24 MED ORDER — LANREOTIDE ACETATE 120 MG/0.5ML ~~LOC~~ SOLN
120.0000 mg | Freq: Once | SUBCUTANEOUS | Status: AC
  Administered 2019-05-24: 120 mg via SUBCUTANEOUS

## 2019-05-24 MED ORDER — LANREOTIDE ACETATE 120 MG/0.5ML ~~LOC~~ SOLN
SUBCUTANEOUS | Status: AC
  Filled 2019-05-24: qty 120

## 2019-05-24 NOTE — Progress Notes (Signed)
Hematology and Oncology Follow Up Visit  Brian Mays [id number] 04/17/1946 73 y.o. 09/05/2017   Principle Diagnosis:  Metastatic low grade neuroendocrine tumor-hepatic metastases Iron def anemia  Current Therapy:   S/p yttrium-90 intrahepatic therapy - November 2016 Somatuline 120 mg monthly Lutathera (Lu 177) injection on 02/01/2018 - s/p cycle #4 IV Iron w/ Injectafer -- dose given on 04/29/2019    Interim History: Brian Mays is back for follow-up.  He looks quite good.  He feels pretty good.  Thankfully, his chromogranin A level actually went down.  Over last saw him, the level was down to 196. Brian Mays  He has had no problems with diarrhea.  He has had no abdominal pain.  He has had no cough or wheezing.  He has had no leg swelling.  We had to give him some IV iron back in June.  This really helped make her feel better.  I think his iron saturation was 14%.  He has had no bleeding.  There is been no nausea or vomiting.  Overall, his performance status is ECOG 0.  Medications:  Allergies as of 09/05/2017      Reactions   Iohexol Hives    Code: HIVES, Desc: PER [name] @ PRIMARY CARE, PT IS ALLERGIC TO CONTRAST DYE 10/02/08/RM  05/01/10...needs full premeds per our protocol w/ [facility]., Onset Date: [id number]      Medication List        Accurate as of 09/05/17 11:45 AM. Always use your most recent med list.          amLODipine 5 MG tablet Commonly known as:  NORVASC Take 5 mg by mouth daily.   aspirin EC 81 MG tablet Take 81 mg by mouth every morning.   atorvastatin 20 MG tablet Commonly known as:  LIPITOR Take 20 mg daily by mouth.   Cholecalciferol 1000 units tablet Take 1,000 Units by mouth daily.   diphenhydrAMINE 50 MG tablet Commonly known as:  BENADRYL Take 1 tablet (50 mg total) by mouth once. Take 1 hour prior to scan.   Fish Oil 1000 MG Caps Take 1 capsule by mouth daily. Reported on 12/30/2015   fluticasone 50 MCG/ACT nasal spray Commonly known as:   FLONASE Place 2 sprays into both nostrils daily as needed (sinuses).   hydrocortisone 25 MG suppository Commonly known as:  ANUSOL-HC INSERT 1 SUPPOSITORY RECTALLY TWICE A DAY AS NEEDED FOR HEMORRHOIDS   lansoprazole 30 MG capsule Commonly known as:  PREVACID Take 1 capsule (30 mg total) by mouth daily at 12 noon.   levothyroxine 50 MCG tablet Commonly known as:  SYNTHROID, LEVOTHROID Take 1 tablet (50 mcg total) by mouth daily.   losartan 100 MG tablet Commonly known as:  COZAAR TAKE 1 TABLET (100 MG TOTAL) BY MOUTH AT BEDTIME.   metoprolol tartrate 25 MG tablet Commonly known as:  LOPRESSOR Take 1 tablet (25 mg total) by mouth 2 (two) times daily.   naproxen 500 MG tablet Commonly known as:  NAPROSYN Take 1 tablet (500 mg total) by mouth 2 (two) times daily with a meal.   nitroGLYCERIN 0.4 MG SL tablet Commonly known as:  NITROSTAT Place 1 tablet (0.4 mg total) under the tongue every 5 (five) minutes x 3 doses as needed for chest pain.   predniSONE 50 MG tablet Commonly known as:  DELTASONE Take 50mg  at 13 hours, 7 hours, and 1 hour before scan   sodium chloride 0.65 % Soln nasal spray Commonly known as:  Data processing manager  Place 1 spray into both nostrils as needed for congestion.   SOMATULINE DEPOT [place] Inject 120 mcg into the skin every 28 (twenty-eight) days. Receives at Dr Antonieta Pert office   ticagrelor 90 MG Tabs tablet Commonly known as:  BRILINTA Take 1 tablet (90 mg total) by mouth 2 (two) times daily.   tiZANidine 4 MG tablet Commonly known as:  ZANAFLEX Take 1 tablet (4 mg total) by mouth every 6 (six) hours as needed for muscle spasms.   vitamin B-12 1000 MCG tablet Commonly known as:  CYANOCOBALAMIN Take 1,000 mcg by mouth daily.       Allergies:  Allergies  Allergen Reactions  . Iohexol Hives     Code: HIVES, Desc: PER [name] @ PRIMARY CARE, PT IS ALLERGIC TO CONTRAST DYE 10/02/08/RM  05/01/10...needs full premeds per our protocol w/ [facility]., Onset Date:  [id number]     Past Medical History, Surgical history, Social history, and Family History were reviewed and updated.  Review of Systems: Review of Systems  Constitutional: Negative for appetite change, fatigue, fever and unexpected weight change.  HENT:   Negative for lump/mass, mouth sores, sore throat and trouble swallowing.   Respiratory: Negative for cough, hemoptysis and shortness of breath.   Cardiovascular: Negative for leg swelling and palpitations.  Gastrointestinal: Negative for abdominal distention, abdominal pain, blood in stool, constipation, diarrhea, nausea and vomiting.  Genitourinary: Negative for bladder incontinence, dysuria, frequency and hematuria.   Musculoskeletal: Negative for arthralgias, back pain, gait problem and myalgias.  Skin: Negative for itching and rash.  Neurological: Negative for dizziness, extremity weakness, gait problem, headaches, numbness, seizures and speech difficulty.  Hematological: Does not bruise/bleed easily.  Psychiatric/Behavioral: Negative for depression and sleep disturbance. The patient is not nervous/anxious.      Physical Exam:  weight is 203 lb 4 oz (92.2 kg). His oral temperature is 98.3 F (36.8 C). His blood pressure is 113/63 and his pulse is 70. His respiration is 18 and oxygen saturation is 98%.   Wt Readings from Last 3 Encounters:  09/05/17 203 lb 4 oz (92.2 kg)  08/25/17 205 lb (93 kg)  07/25/17 205 lb (93 kg)    Physical Exam Vitals signs reviewed.  HENT:     Head: Normocephalic and atraumatic.  Eyes:     Pupils: Pupils are equal, round, and reactive to light.  Neck:     Musculoskeletal: Normal range of motion.  Cardiovascular:     Rate and Rhythm: Normal rate and regular rhythm.     Heart sounds: Normal heart sounds.  Pulmonary:     Effort: Pulmonary effort is normal.     Breath sounds: Normal breath sounds.  Abdominal:     General: Bowel sounds are normal.     Palpations: Abdomen is soft.   Musculoskeletal: Normal range of motion.        General: No tenderness or deformity.  Lymphadenopathy:     Cervical: No cervical adenopathy.  Skin:    General: Skin is warm and dry.     Findings: No erythema or rash.  Neurological:     Mental Status: He is alert and oriented to person, place, and time.  Psychiatric:        Behavior: Behavior normal.        Thought Content: Thought content normal.        Judgment: Judgment normal.   .   Lab Results  Component Value Date   WBC 6.4 09/05/2017   HGB 12.9 (L) 09/05/2017  HCT 40.1 09/05/2017   MCV 83 09/05/2017   PLT 142 (L) 09/05/2017   No results found for: FERRITIN, IRON, TIBC, UIBC, IRONPCTSAT Lab Results  Component Value Date   RBC 4.81 09/05/2017   No results found for: KPAFRELGTCHN, LAMBDASER, KAPLAMBRATIO No results found for: IGGSERUM, IGA, IGMSERUM No results found for: Odetta Pink, SPEI   Chemistry      Component Value Date/Time   NA 145 09/05/2017 1015   NA 141 07/23/2016 1255   K 4.2 09/05/2017 1015   K 4.1 07/23/2016 1255   CL 105 09/05/2017 1015   CO2 29 09/05/2017 1015   CO2 27 07/23/2016 1255   BUN 16 09/05/2017 1015   BUN 13.8 07/23/2016 1255   CREATININE 1.1 09/05/2017 1015   CREATININE 1.0 07/23/2016 1255      Component Value Date/Time   CALCIUM 9.9 09/05/2017 1015   CALCIUM 9.2 07/23/2016 1255   ALKPHOS 66 09/05/2017 1015   ALKPHOS 77 07/23/2016 1255   AST 38 09/05/2017 1015   AST 23 07/23/2016 1255   ALT 31 09/05/2017 1015   ALT 13 07/23/2016 1255   BILITOT 1.00 09/05/2017 1015   BILITOT 1.13 07/23/2016 1255     Impression and Plan: Mr. Oriley is a 73 year old white male. He has metastatic low-grade neuroendocrine carcinoma. He has hepatic metastases.  I tried to set him up with the dotatate scan.  However, his insurance company denied this.  They will only allow [place] to do a CT scanning.  This I think is unfortunate.  The dotatate scan  definitely is a much better means for [place] to identify his active disease.  We will plan to get him back in another month.  He got his Somatuline today.  The chromogranin A level will be critical.   Burney Gauze, MD  11/5/201811:45 AM

## 2019-05-24 NOTE — Addendum Note (Signed)
Addended by: Burney Gauze R on: 05/24/2019 05:04 PM   Modules accepted: Orders

## 2019-05-24 NOTE — Patient Instructions (Signed)
Lanreotide injection What is this medicine? LANREOTIDE (lan REE oh tide) is used to reduce blood levels of growth hormone in patients with a condition called acromegaly. It also works to slow or stop tumor growth in patients with neuroendocrine tumors and treat carcinoid syndrome. This medicine may be used for other purposes; ask your health care provider or pharmacist if you have questions. COMMON BRAND NAME(S): Somatuline [place] What should I tell my health care provider before I take this medicine? They need to know if you have any of these conditions:  diabetes  gallbladder disease  heart disease  kidney disease  liver disease  thyroid disease  an unusual or allergic reaction to lanreotide, other medicines, foods, dyes, or preservatives  pregnant or trying to get pregnant  breast-feeding How should I use this medicine? This medicine is for injection under the skin. It is given by a health care professional in a hospital or clinic setting. Contact your pediatrician or health care professional regarding the use of this medicine in children. Special care may be needed. Overdosage: If you think you have taken too much of this medicine contact a poison control center or emergency room at once. NOTE: This medicine is only for you. Do not share this medicine with others. What if I miss a dose? It is important not to miss your dose. Call your doctor or health care professional if you are unable to keep an appointment. What may interact with this medicine? This medicine may interact with the following medications:  bromocriptine  cyclosporine  certain medicines for blood pressure, heart disease, irregular heart beat  certain medicines for diabetes  quinidine  terfenadine This list may not describe all possible interactions. Give your health care provider a list of all the medicines, herbs, non-prescription drugs, or dietary supplements you use. Also tell them if you smoke,  drink alcohol, or use illegal drugs. Some items may interact with your medicine. What should I watch for while using this medicine? Tell your doctor or healthcare professional if your symptoms do not start to get better or if they get worse. Visit your doctor or health care professional for regular checks on your progress. Your condition will be monitored carefully while you are receiving this medicine. This medicine may increase blood sugar. Ask your healthcare provider if changes in diet or medicines are needed if you have diabetes. You may need blood work done while you are taking this medicine. Women should inform their doctor if they wish to become pregnant or think they might be pregnant. There is a potential for serious side effects to an unborn child. Talk to your health care professional or pharmacist for more information. Do not breast-feed an infant while taking this medicine or for 6 months after stopping it. This medicine has caused ovarian failure in some women. This medicine may interfere with the ability to have a child. Talk with your doctor or health care professional if you are concerned about your fertility. What side effects may I notice from receiving this medicine? Side effects that you should report to your doctor or health care professional as soon as possible:  allergic reactions like skin rash, itching or hives, swelling of the face, lips, or tongue  increased blood pressure  severe stomach pain  signs and symptoms of hgh blood sugar such as being more thirsty or hungry or having to urinate more than normal. You may also feel very tired or have blurry vision.  signs and symptoms of low blood   sugar such as feeling anxious; confusion; dizziness; increased hunger; unusually weak or tired; sweating; shakiness; cold; irritable; headache; blurred vision; fast heartbeat; loss of consciousness  unusually slow heartbeat Side effects that usually do not require medical  attention (report to your doctor or health care professional if they continue or are bothersome):  constipation  diarrhea  dizziness  headache  muscle pain  muscle spasms  nausea  pain, redness, or irritation at site where injected This list may not describe all possible side effects. Call your doctor for medical advice about side effects. You may report side effects to FDA at 1-800-FDA-1088. Where should I keep my medicine? This drug is given in a hospital or clinic and will not be stored at home. NOTE: This sheet is a summary. It may not cover all possible information. If you have questions about this medicine, talk to your doctor, pharmacist, or health care provider.  2020 Elsevier/Gold Standard (2018-07-27 09:13:08)  

## 2019-05-25 LAB — IRON AND TIBC
Iron: 96 ug/dL (ref 42–163)
Saturation Ratios: 33 % (ref 20–55)
TIBC: 290 ug/dL (ref 202–409)
UIBC: 194 ug/dL (ref 117–376)

## 2019-05-25 LAB — FERRITIN: Ferritin: 299 ng/mL (ref 24–336)

## 2019-05-25 LAB — LACTATE DEHYDROGENASE: LDH: 196 U/L — ABNORMAL HIGH (ref 98–192)

## 2019-05-25 LAB — CHROMOGRANIN A: Chromogranin A (ng/mL): 218.4 ng/mL — ABNORMAL HIGH (ref 0.0–101.8)

## 2019-05-30 ENCOUNTER — Encounter: Payer: Self-pay | Admitting: Family Medicine

## 2019-05-30 ENCOUNTER — Encounter (HOSPITAL_COMMUNITY): Payer: Self-pay | Admitting: Emergency Medicine

## 2019-05-30 ENCOUNTER — Ambulatory Visit (HOSPITAL_COMMUNITY): Payer: Medicare HMO

## 2019-05-30 ENCOUNTER — Other Ambulatory Visit: Payer: Self-pay

## 2019-05-30 DIAGNOSIS — I252 Old myocardial infarction: Secondary | ICD-10-CM | POA: Diagnosis not present

## 2019-05-30 DIAGNOSIS — Z7982 Long term (current) use of aspirin: Secondary | ICD-10-CM | POA: Insufficient documentation

## 2019-05-30 DIAGNOSIS — R197 Diarrhea, unspecified: Secondary | ICD-10-CM | POA: Diagnosis present

## 2019-05-30 DIAGNOSIS — Z859 Personal history of malignant neoplasm, unspecified: Secondary | ICD-10-CM | POA: Insufficient documentation

## 2019-05-30 DIAGNOSIS — Z7902 Long term (current) use of antithrombotics/antiplatelets: Secondary | ICD-10-CM | POA: Diagnosis not present

## 2019-05-30 DIAGNOSIS — E039 Hypothyroidism, unspecified: Secondary | ICD-10-CM | POA: Insufficient documentation

## 2019-05-30 DIAGNOSIS — Z20828 Contact with and (suspected) exposure to other viral communicable diseases: Secondary | ICD-10-CM | POA: Insufficient documentation

## 2019-05-30 DIAGNOSIS — Z87891 Personal history of nicotine dependence: Secondary | ICD-10-CM | POA: Insufficient documentation

## 2019-05-30 DIAGNOSIS — A09 Infectious gastroenteritis and colitis, unspecified: Secondary | ICD-10-CM | POA: Diagnosis not present

## 2019-05-30 DIAGNOSIS — Z79899 Other long term (current) drug therapy: Secondary | ICD-10-CM | POA: Diagnosis not present

## 2019-05-30 DIAGNOSIS — I25119 Atherosclerotic heart disease of native coronary artery with unspecified angina pectoris: Secondary | ICD-10-CM | POA: Diagnosis not present

## 2019-05-30 DIAGNOSIS — I10 Essential (primary) hypertension: Secondary | ICD-10-CM | POA: Insufficient documentation

## 2019-05-30 LAB — CBC
HCT: 37.9 % — ABNORMAL LOW (ref 39.0–52.0)
Hemoglobin: 12 g/dL — ABNORMAL LOW (ref 13.0–17.0)
MCH: 29.5 pg (ref 26.0–34.0)
MCHC: 31.7 g/dL (ref 30.0–36.0)
MCV: 93.1 fL (ref 80.0–100.0)
Platelets: 125 10*3/uL — ABNORMAL LOW (ref 150–400)
RBC: 4.07 MIL/uL — ABNORMAL LOW (ref 4.22–5.81)
RDW: 17.9 % — ABNORMAL HIGH (ref 11.5–15.5)
WBC: 8.6 10*3/uL (ref 4.0–10.5)
nRBC: 0 % (ref 0.0–0.2)

## 2019-05-30 LAB — URINALYSIS, ROUTINE W REFLEX MICROSCOPIC
Bilirubin Urine: NEGATIVE
Glucose, UA: NEGATIVE mg/dL
Ketones, ur: 5 mg/dL — AB
Leukocytes,Ua: NEGATIVE
Nitrite: NEGATIVE
Protein, ur: 30 mg/dL — AB
Specific Gravity, Urine: 1.027 (ref 1.005–1.030)
pH: 5 (ref 5.0–8.0)

## 2019-05-30 LAB — COMPREHENSIVE METABOLIC PANEL
ALT: 18 U/L (ref 0–44)
AST: 26 U/L (ref 15–41)
Albumin: 3.5 g/dL (ref 3.5–5.0)
Alkaline Phosphatase: 55 U/L (ref 38–126)
Anion gap: 7 (ref 5–15)
BUN: 19 mg/dL (ref 8–23)
CO2: 27 mmol/L (ref 22–32)
Calcium: 8.3 mg/dL — ABNORMAL LOW (ref 8.9–10.3)
Chloride: 99 mmol/L (ref 98–111)
Creatinine, Ser: 1.14 mg/dL (ref 0.61–1.24)
GFR calc Af Amer: >60 mL/min (ref >60)
GFR calc non Af Amer: >60 mL/min (ref >60)
Glucose, Bld: 134 mg/dL — ABNORMAL HIGH (ref 70–99)
Potassium: 4.2 mmol/L (ref 3.5–5.1)
Sodium: 133 mmol/L — ABNORMAL LOW (ref 135–145)
Total Bilirubin: 0.9 mg/dL (ref 0.3–1.2)
Total Protein: 7.2 g/dL (ref 6.5–8.1)

## 2019-05-30 LAB — LIPASE, BLOOD: Lipase: 26 U/L (ref 11–51)

## 2019-05-30 NOTE — ED Triage Notes (Signed)
Pt c/o fevers of 99.5 and 100 and diarrhea since Saturday

## 2019-05-31 ENCOUNTER — Emergency Department (HOSPITAL_COMMUNITY)
Admission: EM | Admit: 2019-05-31 | Discharge: 2019-05-31 | Disposition: A | Discharge status: Home / Self Care | Payer: Medicare HMO | Attending: Emergency Medicine | Admitting: Emergency Medicine

## 2019-05-31 DIAGNOSIS — R197 Diarrhea, unspecified: Secondary | ICD-10-CM

## 2019-05-31 NOTE — ED Provider Notes (Signed)
[facility] DEPT Provider Note   CSN: [id number] Arrival date & time: 05/30/19  1344     History   Chief Complaint Chief Complaint  Patient presents with  . Fever  . Diarrhea    HPI Brian Mays is a 73 y.o. male.     The history is provided by the patient.  Fever Severity:  Moderate Onset quality:  Gradual Duration:  3 days Timing:  Intermittent Progression:  Waxing and waning Chronicity:  New Relieved by:  Nothing Worsened by:  Nothing Associated symptoms: chills, diarrhea and nausea   Associated symptoms: no chest pain, no congestion, no cough, no headaches and no vomiting   Risk factors: hx of cancer   Diarrhea Associated symptoms: chills and fever   Associated symptoms: no headaches and no vomiting   Patient with history of neuroendocrine tumor with hepatic mets presents with fever and diarrhea Reports over the past 3 days has had fevers, chills.  He has then developed nonbloody diarrhea.  No vomiting.  No chest pain/cough/shortness of breath.  No abdominal pain No recent antibiotics.  No recent changes in medications.  He gets injections monthly for his tumor No recent admissions No sick contacts.  Past Medical History:  Diagnosis Date  . Anemia    in past  . Colon polyps   . Diverticulosis   . Gallstones   . GERD (gastroesophageal reflux disease)   . Heart attack ([place])    mild, Spring 2017  . Heart murmur   . Hemorrhoid   . Hiatal hernia   . HTN (hypertension)   . Hypothyroidism   . Iron deficiency anemia due to chronic blood loss 03/29/2019  . 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 Metastatic carcinoma ([place]) 2010   Dr Jonette Eva  . Pancreatitis 1998   chronic    Patient Active Problem List   Diagnosis Date Noted  . Iron deficiency anemia due to chronic blood loss 03/29/2019  . 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 Lumbar radiculopathy 08/16/2017  . CAD (coronary artery disease) 09/21/2016  . Unstable angina ([place])  02/09/2016  . Vitamin D deficiency 03/11/2015  . Metastatic malignant neuroendocrine tumor to liver ([place]) 02/14/2015  . Hyperglycemia 04/03/2012  . Diarrhea 03/29/2012  . B12 deficiency 04/19/2011  . TOBACCO USE, QUIT 10/13/2009  . Unspecified vitamin D deficiency 06/19/2009  . Neoplasm by body site 12/23/2008  . DIVERTICULOSIS, COLON 11/08/2008  . ABDOMINAL PAIN 10/02/2008  . Hypothyroidism 12/13/2007  . HEMORRHOIDS, NOS 12/13/2007  . COLONIC POLYPS, HX OF 12/13/2007  . Essential hypertension 09/15/2007  . GERD 09/15/2007  . PANCREATITIS, CHRONIC 09/15/2007  . CARDIAC MURMUR 09/15/2007  . SNORING 09/15/2007    Past Surgical History:  Procedure Laterality Date  . APPENDECTOMY  1962  . CARDIAC CATHETERIZATION N/A 02/10/2016   Procedure: Left Heart Cath and Coronary Angiography;  Surgeon: Adrian Prows, MD;  Location: [facility];  Service: Cardiovascular;  Laterality: N/A;  . CARDIAC CATHETERIZATION  02/10/2016   Procedure: Coronary/Graft Atherectomy;  Surgeon: Adrian Prows, MD;  Location: [facility];  Service: Cardiovascular;;  . CARDIAC CATHETERIZATION  02/10/2016   Procedure: Coronary Stent Intervention;  Surgeon: Adrian Prows, MD;  Location: [facility];  Service: Cardiovascular;;  . CATARACT EXTRACTION W/ INTRAOCULAR LENS  IMPLANT, BILATERAL Bilateral   . IR GENERIC HISTORICAL  12/30/2015   IR RADIOLOGIST EVAL & MGMT 12/30/2015 Aletta Edouard, MD [facility] INTERV RAD  . IR GENERIC HISTORICAL  11/09/2016   IR RADIOLOGIST EVAL &  MGMT 11/09/2016 Aletta Edouard, MD [facility] INTERV RAD  . IR RADIOLOGIST EVAL & MGMT  03/01/2017  . IR RADIOLOGIST EVAL & MGMT  06/29/2017  . LAPAROSCOPIC CHOLECYSTECTOMY  1999  . LIVER BIOPSY  2010  . RADIOACTIVE SEED IMPLANT  X 3   "to my liver"  . TUMOR EXCISION  01/2009   Carcinoil Resection   . TUMOR REMOVAL     from small intestine        Home Medications    Prior to Admission medications   Medication Sig Start Date End Date Taking?  Authorizing Provider  aspirin EC 81 MG tablet Take 81 mg by mouth every morning.    [provider]  atorvastatin (LIPITOR) 20 MG tablet Take 1 tablet (20 mg total) by mouth daily at 6 PM. 03/15/19   Nahser, Wonda Cheng, MD  bacitracin ointment Apply 1 application topically 2 (two) times daily. 10/16/18   Varney Biles, MD  Cholecalciferol 1000 UNITS tablet Take 1,000 Units by mouth daily.      [provider]  clopidogrel (PLAVIX) 75 MG tablet TAKE 1 TABLET BY MOUTH DAILY 09/08/18   Nahser, Wonda Cheng, MD  diphenhydrAMINE (BENADRYL) 50 MG tablet Take 1 tablet (50 mg total) by mouth once for 1 dose. Take 1 hour prior to CT contrast 03/14/19 03/14/19  Volanda Napoleon, MD  fluticasone [facility]) 50 MCG/ACT nasal spray Place 2 sprays into both nostrils daily as needed (sinuses).  01/06/15   [provider]  hydrochlorothiazide (HYDRODIURIL) 25 MG tablet Take 0.5 tablets (12.5 mg total) by mouth daily. 08/29/18   Nahser, Wonda Cheng, MD  hydrocortisone (ANUSOL-HC) 25 MG suppository INSERT 1 SUPPOSITORY RECTALLY TWICE A DAY AS NEEDED FOR HEMORRHOIDS 02/07/15   [place], Holli Humbles, NP  ipratropium (ATROVENT) 0.03 % nasal spray Place 2 sprays into the nose 3 (three) times daily. 03/05/19   [place], Gay Filler, MD  Lanreotide Acetate (SOMATULINE DEPOT [place]) Inject 120 mcg into the skin every 28 (twenty-eight) days. Receives at Dr Antonieta Pert office    [provider]  lansoprazole (PREVACID) 30 MG capsule Take 1 capsule (30 mg total) by mouth daily at 12 noon. 11/19/16   [place], Gay Filler, MD  levothyroxine (SYNTHROID) 50 MCG tablet Take 1 tablet (50 mcg total) by mouth daily. 03/05/19   [place], Gay Filler, MD  losartan (COZAAR) 100 MG tablet Take 1 tablet (100 mg total) by mouth daily. 08/29/18   Nahser, Wonda Cheng, MD  metoprolol tartrate (LOPRESSOR) 25 MG tablet Take 0.5 tablets (12.5 mg total) by mouth 2 (two) times daily. 02/19/19   Nahser, Wonda Cheng, MD  nitroGLYCERIN (NITROSTAT) 0.4 MG SL  tablet PLACE 1 TABLET UNDER THE TONGUE EVERY 5 (FIVE) MINUTES X 3 DOSES AS NEEDED FOR CHEST PAIN. 12/01/18   Nahser, Wonda Cheng, MD  Omega-3 Fatty Acids (FISH OIL) 1000 MG CAPS Take 1 capsule by mouth daily. Reported on 12/30/2015    [provider]  sodium chloride (OCEAN) 0.65 % SOLN nasal spray Place 1 spray into both nostrils as needed for congestion.     [provider]  vitamin B-12 (CYANOCOBALAMIN) 1000 MCG tablet Take 1,000 mcg by mouth daily.    [provider]    Family History Family History  Problem Relation Age of Onset  . Kidney disease Mother   . Hyperlipidemia Mother   . Hypertension Mother   . COPD Father   . Ulcerative colitis Daughter     Social History Social History   Tobacco  Use  . Smoking status: Former Smoker    Packs/day: 1.50    Years: 35.00    Pack years: 52.50    Types: Cigarettes    Start date: 09/25/1959    Quit date: 12/21/1997    Years since quitting: 21.4  . Smokeless tobacco: Never Used  Substance Use Topics  . Alcohol use: Yes    Alcohol/week: 3.0 - 4.0 standard drinks    Types: 3 - 4 Cans of beer per week    Comment: 4 beers weekly   . Drug use: No     Allergies   Iohexol   Review of Systems Review of Systems  Constitutional: Positive for chills and fever.  HENT: Negative for congestion.   Respiratory: Negative for cough.   Cardiovascular: Negative for chest pain.  Gastrointestinal: Positive for diarrhea and nausea. Negative for vomiting.  Neurological: Negative for headaches.  All other systems reviewed and are negative.    Physical Exam Updated Vital Signs BP (!) 138/2 (BP Location: Right Arm)   Pulse 96   Temp (!) 100.7 F (38.2 C) (Oral)   Resp 18   SpO2 97%   Physical Exam CONSTITUTIONAL: Well developed/well nourished HEAD: Normocephalic/atraumatic EYES: EOMI ENMT: Mask in place NECK: supple no meningeal signs SPINE/BACK:entire spine nontender LUNGS:no apparent distress ABDOMEN: soft,  nontender, no rebound or guarding, bowel sounds noted throughout abdomen GU:no cva tenderness NEURO: Pt is awake/alert/appropriate, moves all extremitiesx4.  No facial droop.   EXTREMITIES: pulses normal/equal, full ROM SKIN: warm, color normal PSYCH: no abnormalities of mood noted, alert and oriented to situation   ED Treatments / Results  Labs (all labs ordered are listed, but only abnormal results are displayed) Labs Reviewed  COMPREHENSIVE METABOLIC PANEL - Abnormal; Notable for the following components:      Result Value   Sodium 133 (*)    Glucose, Bld 134 (*)    Calcium 8.3 (*)    All other components within normal limits  CBC - Abnormal; Notable for the following components:   RBC 4.07 (*)    Hemoglobin 12.0 (*)    HCT 37.9 (*)    RDW 17.9 (*)    Platelets 125 (*)    All other components within normal limits  URINALYSIS, ROUTINE W REFLEX MICROSCOPIC - Abnormal; Notable for the following components:   Color, Urine AMBER (*)    APPearance HAZY (*)    Hgb urine dipstick SMALL (*)    Ketones, ur 5 (*)    Protein, ur 30 (*)    Bacteria, UA RARE (*)    All other components within normal limits  NOVEL CORONAVIRUS, NAA (HOSPITAL ORDER, SEND-OUT TO REF LAB)  LIPASE, BLOOD    EKG None  Radiology No results found.  Procedures Procedures  Medications Ordered in ED Medications - No data to display   Initial Impression / Assessment and Plan / ED Course  I have reviewed the triage vital signs and the nursing notes.  Pertinent labs  results that were available during my care of the patient were reviewed by me and considered in my medical decision making (see chart for details).        Patient reports fever, chills, diarrhea.  He is well-appearing.  No focal abdominal tenderness.  Labs are overall reassuring except for mild dehydration.  He was requesting discharge.  Will obtain COVID swab. At this point I feel imaging or other testing is warranted.  He appears  appropriate for discharge.  He is nontoxic in  appearance.  We discussed strict return precautions.  If diarrhea continues into next week, he needs follow-up with PCP for further testing including stool studies    Brian Mays was evaluated in Emergency Department on 05/31/2019 for the symptoms described in the history of present illness. He was evaluated in the context of the global COVID-19 pandemic, which necessitated consideration that the patient might be at risk for infection with the SARS-CoV-2 virus that causes COVID-19. Institutional protocols and algorithms that pertain to the evaluation of patients at risk for COVID-19 are in a state of rapid change based on information released by regulatory bodies including the CDC and federal and state organizations. These policies and algorithms were followed during the patient's care in the ED.  Final Clinical Impressions(s) / ED Diagnoses   Final diagnoses:  Diarrhea of presumed infectious origin    ED Discharge Orders    None       Ripley Fraise, MD 05/31/19 [phone]

## 2019-06-03 ENCOUNTER — Encounter: Payer: Self-pay | Admitting: Family Medicine

## 2019-06-03 LAB — NOVEL CORONAVIRUS, NAA (HOSP ORDER, SEND-OUT TO REF LAB; TAT 18-24 HRS): SARS-CoV-2, NAA: NOT DETECTED

## 2019-06-06 ENCOUNTER — Encounter: Payer: Self-pay | Admitting: Family Medicine

## 2019-06-06 ENCOUNTER — Other Ambulatory Visit: Payer: Self-pay

## 2019-06-06 ENCOUNTER — Ambulatory Visit (INDEPENDENT_AMBULATORY_CARE_PROVIDER_SITE_OTHER): Payer: Medicare HMO | Admitting: Family Medicine

## 2019-06-06 VITALS — BP 122/70 | HR 62 | Temp 97.8°F | Resp 16 | Ht 70.5 in | Wt 202.0 lb

## 2019-06-06 DIAGNOSIS — E782 Mixed hyperlipidemia: Secondary | ICD-10-CM

## 2019-06-06 DIAGNOSIS — R197 Diarrhea, unspecified: Secondary | ICD-10-CM

## 2019-06-06 DIAGNOSIS — R509 Fever, unspecified: Secondary | ICD-10-CM

## 2019-06-06 DIAGNOSIS — Z09 Encounter for follow-up examination after completed treatment for conditions other than malignant neoplasm: Secondary | ICD-10-CM

## 2019-06-06 DIAGNOSIS — I10 Essential (primary) hypertension: Secondary | ICD-10-CM | POA: Diagnosis not present

## 2019-06-06 DIAGNOSIS — E039 Hypothyroidism, unspecified: Secondary | ICD-10-CM | POA: Diagnosis not present

## 2019-06-06 DIAGNOSIS — Z209 Contact with and (suspected) exposure to unspecified communicable disease: Secondary | ICD-10-CM | POA: Diagnosis not present

## 2019-06-06 DIAGNOSIS — R3129 Other microscopic hematuria: Secondary | ICD-10-CM | POA: Diagnosis not present

## 2019-06-06 DIAGNOSIS — Z125 Encounter for screening for malignant neoplasm of prostate: Secondary | ICD-10-CM | POA: Diagnosis not present

## 2019-06-06 LAB — CBC
HCT: 33.4 % — ABNORMAL LOW (ref 39.0–52.0)
Hemoglobin: 11 g/dL — ABNORMAL LOW (ref 13.0–17.0)
MCHC: 33 g/dL (ref 30.0–36.0)
MCV: 89.1 fl (ref 78.0–100.0)
Platelets: 217 10*3/uL (ref 150.0–400.0)
RBC: 3.75 Mil/uL — ABNORMAL LOW (ref 4.22–5.81)
RDW: 19 % — ABNORMAL HIGH (ref 11.5–15.5)
WBC: 6.4 10*3/uL (ref 4.0–10.5)

## 2019-06-06 LAB — TSH: TSH: 3.43 u[IU]/mL (ref 0.35–4.50)

## 2019-06-06 LAB — LIPID PANEL
Cholesterol: 76 mg/dL (ref 0–200)
HDL: 22 mg/dL — ABNORMAL LOW (ref >39.00)
LDL Cholesterol: 28 mg/dL (ref 0–99)
NonHDL: 53.67
Total CHOL/HDL Ratio: 3
Triglycerides: 130 mg/dL (ref 0.0–149.0)
VLDL: 26 mg/dL (ref 0.0–40.0)

## 2019-06-06 LAB — BASIC METABOLIC PANEL
BUN: 19 mg/dL (ref 6–23)
CO2: 29 mEq/L (ref 19–32)
Calcium: 8.5 mg/dL (ref 8.4–10.5)
Chloride: 103 mEq/L (ref 96–112)
Creatinine, Ser: 0.93 mg/dL (ref 0.40–1.50)
GFR: 79.8 mL/min (ref >60.00)
Glucose, Bld: 130 mg/dL — ABNORMAL HIGH (ref 70–99)
Potassium: 4.5 mEq/L (ref 3.5–5.1)
Sodium: 137 mEq/L (ref 135–145)

## 2019-06-06 LAB — PSA: PSA: 0.5 ng/mL (ref 0.10–4.00)

## 2019-06-06 NOTE — Patient Instructions (Signed)
It was good to see you today- so glad that you are feeling better!  I will be in touch with your labs, and we will get you set up to see a urologist to discuss the small amount of blood in your urine  Please let me know if you don't continue to get your strength back We can update your pneumonia vaccine next time we visit

## 2019-06-06 NOTE — Progress Notes (Addendum)
[facility] [place], [place], [place] [address] [phone]  Date:  06/06/2019   Name:  Brian Mays   DOB:  [birth year]   MRN:  [id number]  PCP:  Darreld Mclean, MD    Chief Complaint: Diarrhea (started feeling bad saturday, weakness, chills, fever, diarrhea, negative covid)   History of Present Illness:  Brian Mays is a 73 y.o. very pleasant male patient who presents with the following:  History of metastatic malignant neuroendocrine tumor in the liver  Following up from recent ER visit today- seen about a week ago with concern of diarrhea and fever  He had eval including BW, UA and covid test Negative for covid BW ok except for minimal hyponatremia  He was able to hydrate by mouth and went home  Fevers/chills started about 9 days ago Diarrhea started 8 days ago He is doing better, feels that he is at 80% of normal now Diarrhea is nearly resolved- still has occasional loose stools No blood in his stools Last low grade fever was about 3 days ago- T max 100.7  No abd pain No vomiting although he did have some nausea His diet is about normal now- he is being careful not to eat anything too heavy but is able to eat ok   He got a little blood when he blew this nose this am   He did have microhematuria in the ER- he was not aware of this, no gross hematuria  Former smoker - quit 22 years ago   Patient Active Problem List   Diagnosis Date Noted  . Iron deficiency anemia due to chronic blood loss 03/29/2019  . 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 Lumbar radiculopathy 08/16/2017  . CAD (coronary artery disease) 09/21/2016  . Unstable angina ([place]) 02/09/2016  . Vitamin D deficiency 03/11/2015  . Metastatic malignant neuroendocrine tumor to liver ([place]) 02/14/2015  . Hyperglycemia 04/03/2012  . Diarrhea 03/29/2012  . B12 deficiency 04/19/2011  . TOBACCO USE, QUIT 10/13/2009  . Unspecified vitamin D  deficiency 06/19/2009  . Neoplasm by body site 12/23/2008  . DIVERTICULOSIS, COLON 11/08/2008  . ABDOMINAL PAIN 10/02/2008  . Hypothyroidism 12/13/2007  . HEMORRHOIDS, NOS 12/13/2007  . COLONIC POLYPS, HX OF 12/13/2007  . Essential hypertension 09/15/2007  . GERD 09/15/2007  . PANCREATITIS, CHRONIC 09/15/2007  . CARDIAC MURMUR 09/15/2007  . SNORING 09/15/2007    Past Medical History:  Diagnosis Date  . Anemia    in past  . Colon polyps   . Diverticulosis   . Gallstones   . GERD (gastroesophageal reflux disease)   . Heart attack ([place])    mild, Spring 2017  . Heart murmur   . Hemorrhoid   . Hiatal hernia   . HTN (hypertension)   . Hypothyroidism   . Iron deficiency anemia due to chronic blood loss 03/29/2019  . 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 Metastatic carcinoma ([place]) 2010   Dr Jonette Eva  . Pancreatitis 1998   chronic    Past Surgical History:  Procedure Laterality Date  . APPENDECTOMY  1962  . CARDIAC CATHETERIZATION N/A 02/10/2016   Procedure: Left Heart Cath and Coronary Angiography;  Surgeon: Adrian Prows, MD;  Location: [facility];  Service: Cardiovascular;  Laterality: N/A;  . CARDIAC CATHETERIZATION  02/10/2016   Procedure: Coronary/Graft Atherectomy;  Surgeon: Adrian Prows, MD;  Location: [facility];  Service: Cardiovascular;;  . CARDIAC CATHETERIZATION  02/10/2016  Procedure: Coronary Stent Intervention;  Surgeon: Adrian Prows, MD;  Location: [facility];  Service: Cardiovascular;;  . CATARACT EXTRACTION W/ INTRAOCULAR LENS  IMPLANT, BILATERAL Bilateral   . IR GENERIC HISTORICAL  12/30/2015   IR RADIOLOGIST EVAL & MGMT 12/30/2015 Aletta Edouard, MD [facility]  . IR GENERIC HISTORICAL  11/09/2016   IR RADIOLOGIST EVAL & MGMT 11/09/2016 Aletta Edouard, MD [facility] INTER[facility] RAD  . IR RADIOLOGIST EVAL & MGMT  03/01/2017  . IR RADIOLOGIST EVAL & MGMT  06/29/2017  . LAPAROSCOPIC CHOLECYSTECTOMY  1999  . LIVER BIOPSY  2010  . RADIOACTIVE SEED IMPLANT  X 3    "to my liver"  . TUMOR EXCISION  01/2009   Carcinoil Resection   . TUMOR REMOVAL     from small intestine    Social History   Tobacco Use  . Smoking status: Former Smoker    Packs/day: 1.50    Years: 35.00    Pack years: 52.50    Types: Cigarettes    Start date: 09/25/1959    Quit date: 12/21/1997    Years since quitting: 21.4  . Smokeless tobacco: Never Used  Substance Use Topics  . Alcohol use: Yes    Alcohol/week: 3.0 - 4.0 standard drinks    Types: 3 - 4 Cans of beer per week    Comment: 4 beers weekly   . Drug use: No    Family History  Problem Relation Age of Onset  . Kidney disease Mother   . Hyperlipidemia Mother   . Hypertension Mother   . COPD Father   . Ulcerative colitis Daughter     Allergies  Allergen Reactions  . Iohexol Hives     Code: HIVES, Desc: PER [name] @ [facility]IMARY [facility]ARE, PT IS ALLERGIC TO CONTRAST DYE 10/02/08/RM  05/01/10...needs full premeds per our protocol w/ [facility]., Onset Date: [id number]     Medication list has been reviewed and updated.  Current Outpatient Medications on File Prior to Visit  Medication Sig Dispense Refill  . aspirin EC 81 MG tablet Take 81 mg by mouth every morning.    [name] atorvastatin (LIPITOR) 20 MG tablet Take 1 tablet (20 mg total) by mouth daily at 6 PM. 90 tablet 0  . bacitracin ointment Apply 1 application topically 2 (two) times daily. 14 g 0  . Cholecalciferol 1000 UNITS tablet Take 1,000 Units by mouth daily.      . clopidogrel (PLAVIX) 75 MG tablet TAKE 1 TABLET BY MOUTH DAILY 90 tablet 3  . fluticasone (FLONASE) 50 MCG/ACT nasal spray Place 2 sprays into both nostrils daily as needed (sinuses).   11  . hydrochlorothiazide (HYDRODIURIL) 25 MG tablet Take 0.5 tablets (12.5 mg total) by mouth daily. 45 tablet 3  . hydrocortisone (ANUSOL-HC) 25 MG suppository INSERT 1 SUPPOSITORY RECTALLY TWICE A DAY AS NEEDED FOR HEMORRHOIDS 30 suppository 0  . ipratropium (ATROVENT) 0.03 % nasal spray Place 2 sprays into the nose  3 (three) times daily. 30 mL 6  . Lanreotide Acetate (SOMATULINE DEPOT [place]) Inject 120 mcg into the skin every 28 (twenty-eight) days. Receives at Dr Antonieta Pert office    . lansoprazole (PREVACID) 30 MG capsule Take 1 capsule (30 mg total) by mouth daily at 12 noon. 90 capsule 3  . levothyroxine (SYNTHROID) 50 MCG tablet Take 1 tablet (50 mcg total) by mouth daily. 90 tablet 3  . losartan (COZAAR) 100 MG tablet Take 1 tablet (100 mg total) by mouth daily. 90 tablet 3  .  metoprolol tartrate (LOPRESSOR) 25 MG tablet Take 0.5 tablets (12.5 mg total) by mouth 2 (two) times daily. 90 tablet 3  . nitroGLYCERIN (NITROSTAT) 0.4 MG SL tablet PLACE 1 TABLET UNDER THE TONGUE EVERY 5 (FIVE) MINUTES X 3 DOSES AS NEEDED FOR CHEST PAIN. 75 tablet 2  . Omega-3 Fatty Acids (FISH OIL) 1000 MG CAPS Take 1 capsule by mouth daily. Reported on 12/30/2015    . sodium chloride (OCEAN) 0.65 % SOLN nasal spray Place 1 spray into both nostrils as needed for congestion.     . vitamin B-12 (CYANOCOBALAMIN) 1000 MCG tablet Take 1,000 mcg by mouth daily.    . diphenhydrAMINE (BENADRYL) 50 MG tablet Take 1 tablet (50 mg total) by mouth once for 1 dose. Take 1 hour prior to CT contrast 30 tablet 0   No current facility-administered medications on file prior to visit.     Review of Systems:  As per HPI- otherwise negative.   Physical Examination: Vitals:   06/06/19 1026  BP: 122/70  Pulse: 62  Resp: 16  Temp: 97.8 F (36.6 C)  SpO2: 98%   Vitals:   06/06/19 1026  Weight: 202 lb (91.6 kg)  Height: 5' 10.5" (1.791 m)   Body mass index is 28.57 kg/m. Ideal Body Weight: Weight in (lb) to have BMI = 25: 176.4  GEN: WDWN, NAD, Non-toxic, A & O x 3, looks well  HEENT: Atraumatic, Normocephalic. Neck supple. No masses, No LAD.  Bilateral TM wnl, oropharynx normal.  PEERL,EOMI.   Ears and Nose: No external deformity. CV: RRR, No M/G/R. No JVD. No thrill. No extra heart sounds. PULM: CTA B, no wheezes, crackles,  rhonchi. No retractions. No resp. distress. No accessory muscle use. ABD: S, NT, ND, +BS. No rebound. No HSM.  Belly is benign  EXTR: No c/c/e NEURO Normal gait.  PSYCH: Normally interactive. Conversant. Not depressed or anxious appearing.  Calm demeanor.    Assessment and Plan:   ICD-10-CM   1. Hospital discharge follow-up  Z09   2. Microhematuria  R31.29 Ambulatory referral to Urology    CANCELED: Ambulatory referral to Urology  3. Essential hypertension, benign  I10   4. Acquired hypothyroidism  E03.9 TSH  5. Mixed hyperlipidemia  E78.2 Lipid panel  6. Low grade fever  R50.9   7. Diarrhea of presumed infectious origin  S06.3 Basic metabolic panel    CBC  8. Screening for prostate cancer  Z12.5 PSA  9. History of exposure to infectious disease  Z20.9 Varicella zoster antibody, IgG   Following up from recent ER visit for low grade fever and diarrhea He is doing much better Routine labs today Microhematuria- d/w pt, needs urology eval to rule out bladder cancer Referral placed  He would like to have shingles vaccine if needed but is really not sure if he ever had chicken pox- will do zoster igG for him today   Follow-up: No follow-ups on file.  No orders of the defined types were placed in this encounter.  Orders Placed This Encounter  Procedures  . TSH  . PSA  . Lipid panel  . Basic metabolic panel  . CBC  . Varicella zoster antibody, IgG  . Ambulatory referral to Urology    @SIGN @    Signed Lamar Blinks, MD   Received his labs, message to patient Taking levothyroxine and atorvastatin Hemoglobin slightly low but not very abnormal for this patient-may be related to his malignancy-appoint with oncologist coming up Colonoscopy in 2017 Results for  orders placed or performed in visit on 06/06/19  TSH  Result Value Ref Range   TSH 3.43 0.35 - 4.50 uIU/mL  PSA  Result Value Ref Range   PSA 0.50 0.10 - 4.00 ng/mL  Lipid panel  Result Value Ref Range    Cholesterol 76 0 - 200 mg/dL   Triglycerides 130.0 0.0 - 149.0 mg/dL   HDL 22.00 (L) >39.00 mg/dL   VLDL 26.0 0.0 - 40.0 mg/dL   LDL Cholesterol 28 0 - 99 mg/dL   Total CHOL/HDL Ratio 3    NonHDL 79.43   Basic metabolic panel  Result Value Ref Range   Sodium 137 135 - 145 mEq/L   Potassium 4.5 3.5 - 5.1 mEq/L   Chloride 103 96 - 112 mEq/L   CO2 29 19 - 32 mEq/L   Glucose, Bld 130 (H) 70 - 99 mg/dL   BUN 19 6 - 23 mg/dL   Creatinine, Ser 0.93 0.40 - 1.50 mg/dL   Calcium 8.5 8.4 - 10.5 mg/dL   GFR 79.80 >60.00 mL/min  CBC  Result Value Ref Range   WBC 6.4 4.0 - 10.5 K/uL   RBC 3.75 (L) 4.22 - 5.81 Mil/uL   Platelets 217.0 150.0 - 400.0 K/uL   Hemoglobin 11.0 (L) 13.0 - 17.0 g/dL   HCT 33.4 (L) 39.0 - 52.0 %   MCV 89.1 78.0 - 100.0 fl   MCHC 33.0 30.0 - 36.0 g/dL   RDW 19.0 (H) 11.5 - 15.5 %

## 2019-06-07 LAB — VARICELLA ZOSTER ANTIBODY, IGG: Varicella IgG: 318.4 index

## 2019-06-11 ENCOUNTER — Other Ambulatory Visit: Payer: Self-pay | Admitting: Cardiovascular Disease

## 2019-06-11 DIAGNOSIS — E7800 Pure hypercholesterolemia, unspecified: Secondary | ICD-10-CM

## 2019-06-12 ENCOUNTER — Encounter: Payer: Self-pay | Admitting: Family Medicine

## 2019-06-17 ENCOUNTER — Encounter: Payer: Self-pay | Admitting: Family Medicine

## 2019-06-21 ENCOUNTER — Inpatient Hospital Stay: Payer: Medicare HMO

## 2019-06-21 ENCOUNTER — Inpatient Hospital Stay (HOSPITAL_BASED_OUTPATIENT_CLINIC_OR_DEPARTMENT_OTHER): Payer: Medicare HMO | Admitting: Hematology & Oncology

## 2019-06-21 ENCOUNTER — Inpatient Hospital Stay: Payer: Medicare HMO | Attending: Hematology & Oncology

## 2019-06-21 ENCOUNTER — Other Ambulatory Visit: Payer: Self-pay

## 2019-06-21 ENCOUNTER — Encounter: Payer: Self-pay | Admitting: Hematology & Oncology

## 2019-06-21 VITALS — BP 148/64 | HR 63 | Temp 97.5°F | Resp 20 | Wt 205.8 lb

## 2019-06-21 DIAGNOSIS — C7B8 Other secondary neuroendocrine tumors: Secondary | ICD-10-CM | POA: Diagnosis not present

## 2019-06-21 DIAGNOSIS — C7A8 Other malignant neuroendocrine tumors: Secondary | ICD-10-CM | POA: Diagnosis present

## 2019-06-21 DIAGNOSIS — D500 Iron deficiency anemia secondary to blood loss (chronic): Secondary | ICD-10-CM

## 2019-06-21 LAB — CBC WITH DIFFERENTIAL (CANCER CENTER ONLY)
Abs Immature Granulocytes: 0.02 10*3/uL (ref 0.00–0.07)
Basophils Absolute: 0 10*3/uL (ref 0.0–0.1)
Basophils Relative: 1 %
Eosinophils Absolute: 0.2 10*3/uL (ref 0.0–0.5)
Eosinophils Relative: 5 %
HCT: 36.4 % — ABNORMAL LOW (ref 39.0–52.0)
Hemoglobin: 11.6 g/dL — ABNORMAL LOW (ref 13.0–17.0)
Immature Granulocytes: 0 %
Lymphocytes Relative: 22 %
Lymphs Abs: 1 10*3/uL (ref 0.7–4.0)
MCH: 29.7 pg (ref 26.0–34.0)
MCHC: 31.9 g/dL (ref 30.0–36.0)
MCV: 93.3 fL (ref 80.0–100.0)
Monocytes Absolute: 0.5 10*3/uL (ref 0.1–1.0)
Monocytes Relative: 10 %
Neutro Abs: 3 10*3/uL (ref 1.7–7.7)
Neutrophils Relative %: 62 %
Platelet Count: 151 10*3/uL (ref 150–400)
RBC: 3.9 MIL/uL — ABNORMAL LOW (ref 4.22–5.81)
RDW: 17.4 % — ABNORMAL HIGH (ref 11.5–15.5)
WBC Count: 4.8 10*3/uL (ref 4.0–10.5)
nRBC: 0 % (ref 0.0–0.2)

## 2019-06-21 LAB — CMP (CANCER CENTER ONLY)
ALT: 13 U/L (ref 0–44)
AST: 19 U/L (ref 15–41)
Albumin: 3.6 g/dL (ref 3.5–5.0)
Alkaline Phosphatase: 69 U/L (ref 38–126)
Anion gap: 5 (ref 5–15)
BUN: 15 mg/dL (ref 8–23)
CO2: 32 mmol/L (ref 22–32)
Calcium: 8.7 mg/dL — ABNORMAL LOW (ref 8.9–10.3)
Chloride: 102 mmol/L (ref 98–111)
Creatinine: 0.98 mg/dL (ref 0.61–1.24)
GFR, Est AFR Am: >60 mL/min (ref >60)
GFR, Estimated: >60 mL/min (ref >60)
Glucose, Bld: 87 mg/dL (ref 70–99)
Potassium: 5.3 mmol/L — ABNORMAL HIGH (ref 3.5–5.1)
Sodium: 139 mmol/L (ref 135–145)
Total Bilirubin: 0.6 mg/dL (ref 0.3–1.2)
Total Protein: 6.7 g/dL (ref 6.5–8.1)

## 2019-06-21 MED ORDER — LANREOTIDE ACETATE 120 MG/0.5ML ~~LOC~~ SOLN
120.0000 mg | Freq: Once | SUBCUTANEOUS | Status: AC
  Administered 2019-06-21: 15:00:00 120 mg via SUBCUTANEOUS

## 2019-06-21 MED ORDER — LANREOTIDE ACETATE 120 MG/0.5ML ~~LOC~~ SOLN
SUBCUTANEOUS | Status: AC
  Filled 2019-06-21: qty 120

## 2019-06-21 NOTE — Patient Instructions (Signed)
Lanreotide injection What is this medicine? LANREOTIDE (lan REE oh tide) is used to reduce blood levels of growth hormone in patients with a condition called acromegaly. It also works to slow or stop tumor growth in patients with neuroendocrine tumors and treat carcinoid syndrome. This medicine may be used for other purposes; ask your health care provider or pharmacist if you have questions. COMMON BRAND NAME(S): Somatuline [place] What should I tell my health care provider before I take this medicine? They need to know if you have any of these conditions:  diabetes  gallbladder disease  heart disease  kidney disease  liver disease  thyroid disease  an unusual or allergic reaction to lanreotide, other medicines, foods, dyes, or preservatives  pregnant or trying to get pregnant  breast-feeding How should I use this medicine? This medicine is for injection under the skin. It is given by a health care professional in a hospital or clinic setting. Contact your pediatrician or health care professional regarding the use of this medicine in children. Special care may be needed. Overdosage: If you think you have taken too much of this medicine contact a poison control center or emergency room at once. NOTE: This medicine is only for you. Do not share this medicine with others. What if I miss a dose? It is important not to miss your dose. Call your doctor or health care professional if you are unable to keep an appointment. What may interact with this medicine? This medicine may interact with the following medications:  bromocriptine  cyclosporine  certain medicines for blood pressure, heart disease, irregular heart beat  certain medicines for diabetes  quinidine  terfenadine This list may not describe all possible interactions. Give your health care provider a list of all the medicines, herbs, non-prescription drugs, or dietary supplements you use. Also tell them if you smoke,  drink alcohol, or use illegal drugs. Some items may interact with your medicine. What should I watch for while using this medicine? Tell your doctor or healthcare professional if your symptoms do not start to get better or if they get worse. Visit your doctor or health care professional for regular checks on your progress. Your condition will be monitored carefully while you are receiving this medicine. This medicine may increase blood sugar. Ask your healthcare provider if changes in diet or medicines are needed if you have diabetes. You may need blood work done while you are taking this medicine. Women should inform their doctor if they wish to become pregnant or think they might be pregnant. There is a potential for serious side effects to an unborn child. Talk to your health care professional or pharmacist for more information. Do not breast-feed an infant while taking this medicine or for 6 months after stopping it. This medicine has caused ovarian failure in some women. This medicine may interfere with the ability to have a child. Talk with your doctor or health care professional if you are concerned about your fertility. What side effects may I notice from receiving this medicine? Side effects that you should report to your doctor or health care professional as soon as possible:  allergic reactions like skin rash, itching or hives, swelling of the face, lips, or tongue  increased blood pressure  severe stomach pain  signs and symptoms of hgh blood sugar such as being more thirsty or hungry or having to urinate more than normal. You may also feel very tired or have blurry vision.  signs and symptoms of low blood   sugar such as feeling anxious; confusion; dizziness; increased hunger; unusually weak or tired; sweating; shakiness; cold; irritable; headache; blurred vision; fast heartbeat; loss of consciousness  unusually slow heartbeat Side effects that usually do not require medical  attention (report to your doctor or health care professional if they continue or are bothersome):  constipation  diarrhea  dizziness  headache  muscle pain  muscle spasms  nausea  pain, redness, or irritation at site where injected This list may not describe all possible side effects. Call your doctor for medical advice about side effects. You may report side effects to FDA at 1-800-FDA-1088. Where should I keep my medicine? This drug is given in a hospital or clinic and will not be stored at home. NOTE: This sheet is a summary. It may not cover all possible information. If you have questions about this medicine, talk to your doctor, pharmacist, or health care provider.  2020 Elsevier/Gold Standard (2018-07-27 09:13:08)  

## 2019-06-21 NOTE — Progress Notes (Signed)
Hematology and Oncology Follow Up Visit  Brian Mays [id number] 06-09-1946 73 y.o. 09/05/2017   Principle Diagnosis:  Metastatic low grade neuroendocrine tumor-hepatic metastases Iron def anemia  Current Therapy:   S/p yttrium-90 intrahepatic therapy - November 2016 Somatuline 120 mg monthly Lutathera (Lu 177) injection on 02/01/2018 - s/p cycle #4 IV Iron w/ Injectafer -- dose given on 04/29/2019    Interim History: Mr. Daywalt is back for follow-up.  He looks quite good.  He feels pretty good.  Thankfully, his chromogranin A level actually went down.  Today, his chromogranin A level is 176.  We are trying to get a PET scan on him.  Unfortunately, his insurance company is just not going to let us do this.  I am not sure as to why they will not.  I think it is important for [place] to try to get the PET scan so that we can figure out where his active disease is and how to treat this.  He has had no problems with diarrhea.  He has had no cough.  He has had no leg swelling.  He then fairly active.  There is been no rashes.  He has had no nausea or vomiting.  His cardiac status is doing fairly well.  His blood pressure seems to be under pretty good control.  When we last checked his iron studies back in July, his ferritin was 299 with an iron saturation of 33%.  Overall, his performance status is ECOG 0.  Medications:  Allergies as of 09/05/2017      Reactions   Iohexol Hives    Code: HIVES, Desc: PER [name] @ [facility]IMARY [facility], PT IS ALLERGIC TO CONTRAST DYE 10/02/08/RM  05/01/10...needs full premeds per our protocol w/ [facility]., Onset Date: [id number]      Medication List        Accurate as of 09/05/17 11:45 AM. Always use your most recent med list.          amLODipine 5 MG tablet Commonly known as:  NORVASC Take 5 mg by mouth daily.   aspirin EC 81 MG tablet Take 81 mg by mouth every morning.   atorvastatin 20 MG tablet Commonly known as:  LIPITOR Take 20 mg daily by mouth.    Cholecalciferol 1000 units tablet Take 1,000 Units by mouth daily.   diphenhydrAMINE 50 MG tablet Commonly known as:  BENADRYL Take 1 tablet (50 mg total) by mouth once. Take 1 hour prior to scan.   Fish Oil 1000 MG Caps Take 1 capsule by mouth daily. Reported on 12/30/2015   fluticasone 50 MCG/ACT nasal spray Commonly known as:  FLONASE Place 2 sprays into both nostrils daily as needed (sinuses).   hydrocortisone 25 MG suppository Commonly known as:  ANUSOL-HC INSERT 1 SUPPOSITORY RECTALLY TWICE A DAY AS NEEDED FOR HEMORRHOIDS   lansoprazole 30 MG capsule Commonly known as:  PREVACID Take 1 capsule (30 mg total) by mouth daily at 12 noon.   levothyroxine 50 MCG tablet Commonly known as:  SYNTHROID, LEVOTHROID Take 1 tablet (50 mcg total) by mouth daily.   losartan 100 MG tablet Commonly known as:  COZAAR TAKE 1 TABLET (100 MG TOTAL) BY MOUTH AT BEDTIME.   metoprolol tartrate 25 MG tablet Commonly known as:  LOPRESSOR Take 1 tablet (25 mg total) by mouth 2 (two) times daily.   naproxen 500 MG tablet Commonly known as:  NAPROSYN Take 1 tablet (500 mg total) by mouth 2 (two) times daily with a  meal.   nitroGLYCERIN 0.4 MG SL tablet Commonly known as:  NITROSTAT Place 1 tablet (0.4 mg total) under the tongue every 5 (five) minutes x 3 doses as needed for chest pain.   predniSONE 50 MG tablet Commonly known as:  DELTASONE Take 50mg  at 13 hours, 7 hours, and 1 hour before scan   sodium chloride 0.65 % Soln nasal spray Commonly known as:  OCEAN Place 1 spray into both nostrils as needed for congestion.   SOMATULINE DEPOT [place] Inject 120 mcg into the skin every 28 (twenty-eight) days. Receives at Dr Antonieta Pert office   ticagrelor 90 MG Tabs tablet Commonly known as:  BRILINTA Take 1 tablet (90 mg total) by mouth 2 (two) times daily.   tiZANidine 4 MG tablet Commonly known as:  ZANAFLEX Take 1 tablet (4 mg total) by mouth every 6 (six) hours as needed for muscle  spasms.   vitamin B-12 1000 MCG tablet Commonly known as:  CYANOCOBALAMIN Take 1,000 mcg by mouth daily.       Allergies:  Allergies  Allergen Reactions  . Iohexol Hives     Code: HIVES, Desc: PER [name] @ [facility]IMARY [facility], PT IS ALLERGIC TO CONTRAST DYE 10/02/08/RM  05/01/10...needs full premeds per our protocol w/ [facility]., Onset Date: [id number]     Past Medical History, Surgical history, Social history, and Family History were reviewed and updated.  Review of Systems: Review of Systems  Constitutional: Negative for appetite change, fatigue, fever and unexpected weight change.  HENT:   Negative for lump/mass, mouth sores, sore throat and trouble swallowing.   Respiratory: Negative for cough, hemoptysis and shortness of breath.   Cardiovascular: Negative for leg swelling and palpitations.  Gastrointestinal: Negative for abdominal distention, abdominal pain, blood in stool, constipation, diarrhea, nausea and vomiting.  Genitourinary: Negative for bladder incontinence, dysuria, frequency and hematuria.   Musculoskeletal: Negative for arthralgias, back pain, gait problem and myalgias.  Skin: Negative for itching and rash.  Neurological: Negative for dizziness, extremity weakness, gait problem, headaches, numbness, seizures and speech difficulty.  Hematological: Does not bruise/bleed easily.  Psychiatric/Behavioral: Negative for depression and sleep disturbance. The patient is not nervous/anxious.      Physical Exam:  weight is 203 lb 4 oz (92.2 kg). His oral temperature is 98.3 F (36.8 C). His blood pressure is 113/63 and his pulse is 70. His respiration is 18 and oxygen saturation is 98%.   Wt Readings from Last 3 Encounters:  09/05/17 203 lb 4 oz (92.2 kg)  08/25/17 205 lb (93 kg)  07/25/17 205 lb (93 kg)    Physical Exam Vitals signs reviewed.  HENT:     Head: Normocephalic and atraumatic.  Eyes:     Pupils: Pupils are equal, round, and reactive to light.  Neck:      Musculoskeletal: Normal range of motion.  Cardiovascular:     Rate and Rhythm: Normal rate and regular rhythm.     Heart sounds: Normal heart sounds.  Pulmonary:     Effort: Pulmonary effort is normal.     Breath sounds: Normal breath sounds.  Abdominal:     General: Bowel sounds are normal.     Palpations: Abdomen is soft.  Musculoskeletal: Normal range of motion.        General: No tenderness or deformity.  Lymphadenopathy:     Cervical: No cervical adenopathy.  Skin:    General: Skin is warm and dry.     Findings: No erythema or rash.  Neurological:  Mental Status: He is alert and oriented to person, place, and time.  Psychiatric:        Behavior: Behavior normal.        Thought Content: Thought content normal.        Judgment: Judgment normal.   .   Lab Results  Component Value Date   WBC 6.4 09/05/2017   HGB 12.9 (L) 09/05/2017   HCT 40.1 09/05/2017   MCV 83 09/05/2017   PLT 142 (L) 09/05/2017   No results found for: FERRITIN, IRON, TIBC, UIBC, IRONPCTSAT Lab Results  Component Value Date   RBC 4.81 09/05/2017   No results found for: KPAFRELGTCHN, LAMBDASER, KAPLAMBRATIO No results found for: IGGSERUM, IGA, IGMSERUM No results found for: Odetta Pink, SPEI   Chemistry      Component Value Date/Time   NA 145 09/05/2017 1015   NA 141 07/23/2016 1255   K 4.2 09/05/2017 1015   K 4.1 07/23/2016 1255   CL 105 09/05/2017 1015   CO2 29 09/05/2017 1015   CO2 27 07/23/2016 1255   BUN 16 09/05/2017 1015   BUN 13.8 07/23/2016 1255   CREATININE 1.1 09/05/2017 1015   CREATININE 1.0 07/23/2016 1255      Component Value Date/Time   CALCIUM 9.9 09/05/2017 1015   CALCIUM 9.2 07/23/2016 1255   ALKPHOS 66 09/05/2017 1015   ALKPHOS 77 07/23/2016 1255   AST 38 09/05/2017 1015   AST 23 07/23/2016 1255   ALT 31 09/05/2017 1015   ALT 13 07/23/2016 1255   BILITOT 1.00 09/05/2017 1015   BILITOT 1.13 07/23/2016 1255      Impression and Plan: Mr. Winterrowd is a 73 year old white male. He has metastatic low-grade neuroendocrine carcinoma. He has hepatic metastases.  We may just have to consider a CT scan for him.  Again, this is not as accurate as the PET scan.  However, the insurance company just is not going to let us do a PET scan because of cost issues.  Hopefully, we will not find that he is progressing.  If he is, then we will have to figure out how we can treat this with systemic therapy.  I have tried to hold off on systemic therapy for several years.  We will plan to get him back in another month.  He got his Somatuline today.  The chromogranin A level will be critical.   Burney Gauze, MD  11/5/201811:45 AM

## 2019-06-22 ENCOUNTER — Encounter: Payer: Self-pay | Admitting: *Deleted

## 2019-06-22 ENCOUNTER — Telehealth: Payer: Self-pay | Admitting: Hematology & Oncology

## 2019-06-22 NOTE — Telephone Encounter (Signed)
NO LOS FOR 8/20 VISIT

## 2019-06-25 LAB — CHROMOGRANIN A: Chromogranin A (ng/mL): 176.7 ng/mL — ABNORMAL HIGH (ref 0.0–101.8)

## 2019-07-11 ENCOUNTER — Other Ambulatory Visit: Payer: Self-pay | Admitting: *Deleted

## 2019-07-11 ENCOUNTER — Telehealth: Payer: Self-pay | Admitting: Hematology & Oncology

## 2019-07-11 MED ORDER — DIPHENHYDRAMINE HCL 50 MG PO TABS: 50.0000 mg | ORAL_TABLET | Freq: Once | ORAL | 2 refills | Status: DC

## 2019-07-11 MED ORDER — PREDNISONE 50 MG PO TABS: ORAL_TABLET | ORAL | 0 refills | Status: DC

## 2019-07-11 NOTE — Telephone Encounter (Signed)
lmom to inform patient of Sept appts per 9/9 sch msg

## 2019-07-12 ENCOUNTER — Encounter: Payer: Self-pay | Admitting: Hematology & Oncology

## 2019-07-13 ENCOUNTER — Other Ambulatory Visit: Payer: Self-pay | Admitting: Cardiovascular Disease

## 2019-07-20 ENCOUNTER — Encounter: Payer: Self-pay | Admitting: Hematology & Oncology

## 2019-07-23 ENCOUNTER — Ambulatory Visit (HOSPITAL_BASED_OUTPATIENT_CLINIC_OR_DEPARTMENT_OTHER): Payer: Medicare HMO

## 2019-07-27 ENCOUNTER — Encounter: Payer: Self-pay | Admitting: Hematology & Oncology

## 2019-07-27 ENCOUNTER — Inpatient Hospital Stay: Payer: Medicare HMO | Attending: Hematology & Oncology | Admitting: Hematology & Oncology

## 2019-07-27 ENCOUNTER — Other Ambulatory Visit: Payer: Self-pay

## 2019-07-27 ENCOUNTER — Inpatient Hospital Stay: Payer: Medicare HMO

## 2019-07-27 VITALS — BP 134/69 | HR 69 | Temp 97.5°F | Resp 18 | Wt 209.5 lb

## 2019-07-27 DIAGNOSIS — C7B8 Other secondary neuroendocrine tumors: Secondary | ICD-10-CM

## 2019-07-27 DIAGNOSIS — C7A8 Other malignant neuroendocrine tumors: Secondary | ICD-10-CM | POA: Insufficient documentation

## 2019-07-27 DIAGNOSIS — D509 Iron deficiency anemia, unspecified: Secondary | ICD-10-CM | POA: Insufficient documentation

## 2019-07-27 DIAGNOSIS — D500 Iron deficiency anemia secondary to blood loss (chronic): Secondary | ICD-10-CM

## 2019-07-27 LAB — CMP (CANCER CENTER ONLY)
ALT: 16 U/L (ref 0–44)
AST: 23 U/L (ref 15–41)
Albumin: 4.1 g/dL (ref 3.5–5.0)
Alkaline Phosphatase: 66 U/L (ref 38–126)
Anion gap: 7 (ref 5–15)
BUN: 14 mg/dL (ref 8–23)
CO2: 32 mmol/L (ref 22–32)
Calcium: 9.6 mg/dL (ref 8.9–10.3)
Chloride: 102 mmol/L (ref 98–111)
Creatinine: 0.98 mg/dL (ref 0.61–1.24)
GFR, Est AFR Am: >60 mL/min (ref >60)
GFR, Estimated: >60 mL/min (ref >60)
Glucose, Bld: 115 mg/dL — ABNORMAL HIGH (ref 70–99)
Potassium: 4.3 mmol/L (ref 3.5–5.1)
Sodium: 141 mmol/L (ref 135–145)
Total Bilirubin: 0.6 mg/dL (ref 0.3–1.2)
Total Protein: 7.5 g/dL (ref 6.5–8.1)

## 2019-07-27 LAB — CBC WITH DIFFERENTIAL (CANCER CENTER ONLY)
Abs Immature Granulocytes: 0.01 10*3/uL (ref 0.00–0.07)
Basophils Absolute: 0 10*3/uL (ref 0.0–0.1)
Basophils Relative: 1 %
Eosinophils Absolute: 0.3 10*3/uL (ref 0.0–0.5)
Eosinophils Relative: 5 %
HCT: 39 % (ref 39.0–52.0)
Hemoglobin: 12.6 g/dL — ABNORMAL LOW (ref 13.0–17.0)
Immature Granulocytes: 0 %
Lymphocytes Relative: 20 %
Lymphs Abs: 1.1 10*3/uL (ref 0.7–4.0)
MCH: 30.1 pg (ref 26.0–34.0)
MCHC: 32.3 g/dL (ref 30.0–36.0)
MCV: 93.1 fL (ref 80.0–100.0)
Monocytes Absolute: 0.4 10*3/uL (ref 0.1–1.0)
Monocytes Relative: 8 %
Neutro Abs: 3.8 10*3/uL (ref 1.7–7.7)
Neutrophils Relative %: 66 %
Platelet Count: 157 10*3/uL (ref 150–400)
RBC: 4.19 MIL/uL — ABNORMAL LOW (ref 4.22–5.81)
RDW: 16 % — ABNORMAL HIGH (ref 11.5–15.5)
WBC Count: 5.6 10*3/uL (ref 4.0–10.5)
nRBC: 0 % (ref 0.0–0.2)

## 2019-07-27 MED ORDER — LANREOTIDE ACETATE 120 MG/0.5ML ~~LOC~~ SOLN
120.0000 mg | Freq: Once | SUBCUTANEOUS | Status: AC
  Administered 2019-07-27: 120 mg via SUBCUTANEOUS

## 2019-07-27 NOTE — Progress Notes (Signed)
Hematology and Oncology Follow Up Visit  Brian Mays [id number] 30-Jul-1946 73 y.o. 09/05/2017   Principle Diagnosis:  Metastatic low grade neuroendocrine tumor-hepatic metastases Iron def anemia  Current Therapy:   S/p yttrium-90 intrahepatic therapy - November 2016 Somatuline 120 mg monthly Lutathera (Lu 177) injection on 02/01/2018 - s/p cycle #4 IV Iron w/ Injectafer -- dose given on 04/29/2019    Interim History: Brian Mays is back for follow-up.  He actually is doing pretty well.  He and his [place] just got [place] from the mountains.  The had a really nice time out there.  He played some golf.  He enjoyed his round of golf.  The PET scan finally got approved.  It sounds like he is going to have this in the next week or so.  His last chromogranin A level had been down a little bit.  I think it was down to 176.  It seems to be improving.  His blood pressure is doing quite well.  He has had no problems with diarrhea.  He has had no abdominal pain.  He has had no cough or shortness of breath.  He has had no wheezing.  His wife is doing a little bit better.  She does have some back issues.  Overall, his performance status is ECOG 0.  Medications:  Allergies as of 09/05/2017      Reactions   Iohexol Hives    Code: HIVES, Desc: PER [name] @ [facility], PT IS ALLERGIC TO CONTRAST DYE 10/02/08/RM  05/01/10...needs full premeds per our protocol w/ [facility]., Onset Date: [id number]      Medication List        Accurate as of 09/05/17 11:45 AM. Always use your most recent med list.          amLODipine 5 MG tablet Commonly known as:  NORVASC Take 5 mg by mouth daily.   aspirin EC 81 MG tablet Take 81 mg by mouth every morning.   atorvastatin 20 MG tablet Commonly known as:  LIPITOR Take 20 mg daily by mouth.   Cholecalciferol 1000 units tablet Take 1,000 Units by mouth daily.   diphenhydrAMINE 50 MG tablet Commonly known as:  BENADRYL Take 1 tablet (50 mg total) by mouth  once. Take 1 hour prior to scan.   Fish Oil 1000 MG Caps Take 1 capsule by mouth daily. Reported on 12/30/2015   fluticasone 50 MCG/ACT nasal spray Commonly known as:  FLONASE Place 2 sprays into both nostrils daily as needed (sinuses).   hydrocortisone 25 MG suppository Commonly known as:  ANUSOL-HC INSERT 1 SUPPOSITORY RECTALLY TWICE A DAY AS NEEDED FOR HEMORRHOIDS   lansoprazole 30 MG capsule Commonly known as:  PREVACID Take 1 capsule (30 mg total) by mouth daily at 12 noon.   levothyroxine 50 MCG tablet Commonly known as:  SYNTHROID, LEVOTHROID Take 1 tablet (50 mcg total) by mouth daily.   losartan 100 MG tablet Commonly known as:  COZAAR TAKE 1 TABLET (100 MG TOTAL) BY MOUTH AT BEDTIME.   metoprolol tartrate 25 MG tablet Commonly known as:  LOPRESSOR Take 1 tablet (25 mg total) by mouth 2 (two) times daily.   naproxen 500 MG tablet Commonly known as:  NAPROSYN Take 1 tablet (500 mg total) by mouth 2 (two) times daily with a meal.   nitroGLYCERIN 0.4 MG SL tablet Commonly known as:  NITROSTAT Place 1 tablet (0.4 mg total) under the tongue every 5 (five) minutes x 3 doses as needed  for chest pain.   predniSONE 50 MG tablet Commonly known as:  DELTASONE Take 50mg  at 13 hours, 7 hours, and 1 hour before scan   sodium chloride 0.65 % Soln nasal spray Commonly known as:  OCEAN Place 1 spray into both nostrils as needed for congestion.   SOMATULINE DEPOT [place] Inject 120 mcg into the skin every 28 (twenty-eight) days. Receives at Dr Antonieta Pert office   ticagrelor 90 MG Tabs tablet Commonly known as:  BRILINTA Take 1 tablet (90 mg total) by mouth 2 (two) times daily.   tiZANidine 4 MG tablet Commonly known as:  ZANAFLEX Take 1 tablet (4 mg total) by mouth every 6 (six) hours as needed for muscle spasms.   vitamin B-12 1000 MCG tablet Commonly known as:  CYANOCOBALAMIN Take 1,000 mcg by mouth daily.       Allergies:  Allergies  Allergen Reactions  . Iohexol  Hives     Code: HIVES, Desc: PER [name] @ [facility], PT IS ALLERGIC TO CONTRAST DYE 10/02/08/RM  05/01/10...needs full premeds per our protocol w/ [facility]., Onset Date: [id number]     Past Medical History, Surgical history, Social history, and Family History were reviewed and updated.  Review of Systems: Review of Systems  Constitutional: Negative for appetite change, fatigue, fever and unexpected weight change.  HENT:   Negative for lump/mass, mouth sores, sore throat and trouble swallowing.   Respiratory: Negative for cough, hemoptysis and shortness of breath.   Cardiovascular: Negative for leg swelling and palpitations.  Gastrointestinal: Negative for abdominal distention, abdominal pain, blood in stool, constipation, diarrhea, nausea and vomiting.  Genitourinary: Negative for bladder incontinence, dysuria, frequency and hematuria.   Musculoskeletal: Negative for arthralgias, back pain, gait problem and myalgias.  Skin: Negative for itching and rash.  Neurological: Negative for dizziness, extremity weakness, gait problem, headaches, numbness, seizures and speech difficulty.  Hematological: Does not bruise/bleed easily.  Psychiatric/Behavioral: Negative for depression and sleep disturbance. The patient is not nervous/anxious.      Physical Exam:  weight is 209 lb 4 oz (92.2 kg). His oral temperature is 98.3 F (36.8 C). His blood pressure is 113/63 and his pulse is 70. His respiration is 18 and oxygen saturation is 98%.   Wt Readings from Last 3 Encounters:  09/05/17 203 lb 4 oz (92.2 kg)  08/25/17 205 lb (93 kg)  07/25/17 205 lb (93 kg)    Physical Exam Vitals signs reviewed.  HENT:     Head: Normocephalic and atraumatic.  Eyes:     Pupils: Pupils are equal, round, and reactive to light.  Neck:     Musculoskeletal: Normal range of motion.  Cardiovascular:     Rate and Rhythm: Normal rate and regular rhythm.     Heart sounds: Normal heart sounds.  Pulmonary:     Effort:  Pulmonary effort is normal.     Breath sounds: Normal breath sounds.  Abdominal:     General: Bowel sounds are normal.     Palpations: Abdomen is soft.  Musculoskeletal: Normal range of motion.        General: No tenderness or deformity.  Lymphadenopathy:     Cervical: No cervical adenopathy.  Skin:    General: Skin is warm and dry.     Findings: No erythema or rash.  Neurological:     Mental Status: He is alert and oriented to person, place, and time.  Psychiatric:        Behavior: Behavior normal.  Thought Content: Thought content normal.        Judgment: Judgment normal.   .   Lab Results  Component Value Date   WBC 6.4 09/05/2017   HGB 12.9 (L) 09/05/2017   HCT 40.1 09/05/2017   MCV 83 09/05/2017   PLT 142 (L) 09/05/2017   No results found for: FERRITIN, IRON, TIBC, UIBC, IRONPCTSAT Lab Results  Component Value Date   RBC 4.81 09/05/2017   No results found for: KPAFRELGTCHN, LAMBDASER, KAPLAMBRATIO No results found for: IGGSERUM, IGA, IGMSERUM No results found for: Odetta Pink, SPEI   Chemistry      Component Value Date/Time   NA 145 09/05/2017 1015   NA 141 07/23/2016 1255   K 4.2 09/05/2017 1015   K 4.1 07/23/2016 1255   CL 105 09/05/2017 1015   CO2 29 09/05/2017 1015   CO2 27 07/23/2016 1255   BUN 16 09/05/2017 1015   BUN 13.8 07/23/2016 1255   CREATININE 1.1 09/05/2017 1015   CREATININE 1.0 07/23/2016 1255      Component Value Date/Time   CALCIUM 9.9 09/05/2017 1015   CALCIUM 9.2 07/23/2016 1255   ALKPHOS 66 09/05/2017 1015   ALKPHOS 77 07/23/2016 1255   AST 38 09/05/2017 1015   AST 23 07/23/2016 1255   ALT 31 09/05/2017 1015   ALT 13 07/23/2016 1255   BILITOT 1.00 09/05/2017 1015   BILITOT 1.13 07/23/2016 1255     Impression and Plan: Brian Mays is a 73 year old white male. He has metastatic low-grade neuroendocrine carcinoma. He has hepatic metastases.  Now that we were able to get  the PET scan approved, I would like to think that we will be able to make decisions regarding his treatment protocol.  Hopefully, he will be able to stay on the Somatuline.  His quality life is doing quite well right now.  I will plan to see him back in about 4 weeks or so.    Burney Gauze, MD  11/5/201811:45 AM

## 2019-07-27 NOTE — Patient Instructions (Signed)
Lanreotide injection (Somatuline) What is this medicine? LANREOTIDE (lan REE oh tide) is used to reduce blood levels of growth hormone in patients with a condition called acromegaly. It also works to slow or stop tumor growth in patients with neuroendocrine tumors and treat carcinoid syndrome. This medicine may be used for other purposes; ask your health care provider or pharmacist if you have questions. COMMON BRAND NAME(S): Somatuline [place] What should I tell my health care provider before I take this medicine? They need to know if you have any of these conditions:  diabetes  gallbladder disease  heart disease  kidney disease  liver disease  thyroid disease  an unusual or allergic reaction to lanreotide, other medicines, foods, dyes, or preservatives  pregnant or trying to get pregnant  breast-feeding How should I use this medicine? This medicine is for injection under the skin. It is given by a health care professional in a hospital or clinic setting. Contact your pediatrician or health care professional regarding the use of this medicine in children. Special care may be needed. Overdosage: If you think you have taken too much of this medicine contact a poison control center or emergency room at once. NOTE: This medicine is only for you. Do not share this medicine with others. What if I miss a dose? It is important not to miss your dose. Call your doctor or health care professional if you are unable to keep an appointment. What may interact with this medicine? This medicine may interact with the following medications:  bromocriptine  cyclosporine  certain medicines for blood pressure, heart disease, irregular heart beat  certain medicines for diabetes  quinidine  terfenadine This list may not describe all possible interactions. Give your health care provider a list of all the medicines, herbs, non-prescription drugs, or dietary supplements you use. Also tell them if  you smoke, drink alcohol, or use illegal drugs. Some items may interact with your medicine. What should I watch for while using this medicine? Tell your doctor or healthcare professional if your symptoms do not start to get better or if they get worse. Visit your doctor or health care professional for regular checks on your progress. Your condition will be monitored carefully while you are receiving this medicine. This medicine may increase blood sugar. Ask your healthcare provider if changes in diet or medicines are needed if you have diabetes. You may need blood work done while you are taking this medicine. Women should inform their doctor if they wish to become pregnant or think they might be pregnant. There is a potential for serious side effects to an unborn child. Talk to your health care professional or pharmacist for more information. Do not breast-feed an infant while taking this medicine or for 6 months after stopping it. This medicine has caused ovarian failure in some women. This medicine may interfere with the ability to have a child. Talk with your doctor or health care professional if you are concerned about your fertility. What side effects may I notice from receiving this medicine? Side effects that you should report to your doctor or health care professional as soon as possible:  allergic reactions like skin rash, itching or hives, swelling of the face, lips, or tongue  increased blood pressure  severe stomach pain  signs and symptoms of hgh blood sugar such as being more thirsty or hungry or having to urinate more than normal. You may also feel very tired or have blurry vision.  signs and symptoms of low  blood sugar such as feeling anxious; confusion; dizziness; increased hunger; unusually weak or tired; sweating; shakiness; cold; irritable; headache; blurred vision; fast heartbeat; loss of consciousness  unusually slow heartbeat Side effects that usually do not require  medical attention (report to your doctor or health care professional if they continue or are bothersome):  constipation  diarrhea  dizziness  headache  muscle pain  muscle spasms  nausea  pain, redness, or irritation at site where injected This list may not describe all possible side effects. Call your doctor for medical advice about side effects. You may report side effects to FDA at 1-800-FDA-1088. Where should I keep my medicine? This drug is given in a hospital or clinic and will not be stored at home. NOTE: This sheet is a summary. It may not cover all possible information. If you have questions about this medicine, talk to your doctor, pharmacist, or health care provider.  2020 [employer]/Gold Standard (2018-07-27 09:13:08)  

## 2019-07-30 LAB — FERRITIN: Ferritin: 149 ng/mL (ref 24–336)

## 2019-07-30 LAB — IRON AND TIBC
Iron: 74 ug/dL (ref 42–163)
Saturation Ratios: 26 % (ref 20–55)
TIBC: 283 ug/dL (ref 202–409)
UIBC: 209 ug/dL (ref 117–376)

## 2019-07-31 LAB — CHROMOGRANIN A: Chromogranin A (ng/mL): 124.2 ng/mL — ABNORMAL HIGH (ref 0.0–101.8)

## 2019-08-02 ENCOUNTER — Telehealth: Payer: Self-pay | Admitting: Hematology & Oncology

## 2019-08-02 NOTE — Telephone Encounter (Signed)
lmom to inform patient of 10/30 appt time change to 11 am due to MD being on call that afternoon. Asked patient to return call if need to r/s appt

## 2019-08-06 ENCOUNTER — Ambulatory Visit: Payer: Medicare HMO | Admitting: Cardiovascular Disease

## 2019-08-06 ENCOUNTER — Other Ambulatory Visit: Payer: Self-pay

## 2019-08-06 ENCOUNTER — Encounter: Payer: Self-pay | Admitting: Cardiovascular Disease

## 2019-08-06 VITALS — BP 146/74 | HR 65 | Ht 70.5 in | Wt 207.0 lb

## 2019-08-06 DIAGNOSIS — I10 Essential (primary) hypertension: Secondary | ICD-10-CM

## 2019-08-06 DIAGNOSIS — I2510 Atherosclerotic heart disease of native coronary artery without angina pectoris: Secondary | ICD-10-CM | POA: Diagnosis not present

## 2019-08-06 NOTE — Patient Instructions (Signed)
Medication Instructions:  Your physician recommends that you continue on your current medications as directed. Please refer to the Current Medication list given to you today.  If you need a refill on your cardiac medications before your next appointment, please call your pharmacy.    Lab work: None Ordered    Testing/Procedures: None Ordered   Follow-Up: At [facility], you and your health needs are our priority.  As part of our continuing mission to provide you with exceptional heart care, we have created designated Provider Care Teams.  These Care Teams include your primary Cardiologist (physician) and Advanced Practice Providers (APPs -  Physician Assistants and Nurse Practitioners) who all work together to provide you with the care you need, when you need it. You will need a follow up appointment in:  1 years.  Please call our office 2 months in advance to schedule this appointment.  You may see Philip Nahser, MD or one of the following Advanced Practice Providers on your designated Care Team: Scott Weaver, PA-C Vin Bhagat, PA-C . Janine Hammond, NP   

## 2019-08-06 NOTE — Progress Notes (Signed)
Patient ID: JACOBUS PRENGER; [id number]; [birth year]   Admit date: (Not on file) Date of Consult: 08/06/2019  Primary Care Provider: Darreld Mclean, MD Primary Cardiologist:    Primary Electrophysiologist:     Problem list 1.  Coronary artery disease: Status post rotational atherectomy and stenting of the RCA 2.  Hypertension 3.  Hyperlipidemia 4.  Hypothyroidism  Patient Profile:   Brian Mays is a 73 y.o. male with a hx of HTN, , coronary artery disease with a recent episode of weakness and near syncope.  who is being seen today for the evaluation of HTN  at the request of Dr. Marin Olp. He sees Dr. Marin Olp for Carcinoid       Brian Mays is seen wife wife , Brian Mays today   Hx of coronary stenting in March 2017 .   Was started on Metoprolol, he has had intermittent weakness and dizziness/lightheadedness since that time.  He eats and drinks regularly.   Generally gets some regular exercise.  Has had some back issues recently.  He still goes to the line does some exercises that do not cause further back pain.  Dr. Marin Olp asked him to reduce his BP meds.   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  [facility] . Does have some lightheadedness after working out .  No CP or dyspnea while working out.   No synmptoms similar to his MI.    He stopped the Losartan last week He decreased the metoprolol to 12. 5 mg BID last week.   Still eats salty foods. Wife cooks from [employer] account [employer] .  Eats some pretzels.   Has flushing related to Carcinoid.    Typically occurs if he drinks some beer .   Feb.  12, 2019: Doing well Gaining some weight .   Gave him the OK to walk  No CP or dyspnea   Aug. 27 ,2019:  Doing well.   Feeling better since he decreased his metoprolol in half.   September 15, 2018: Seen back today for follow-up of his coronary artery disease, hypertension,  hyperlipidemia. He increase his losartan 3 weeks ago. No CP  BP readings at home have been elevated.  Is not exercising   August 06, 2019:  Brian Mays is seen back today for follow-up of his coronary artery disease, hypertension, hyperlipidemia. BP has been well controlled at home .  No CP , is active , not doing exercise per se Tries to avoid salty foods.   Past Medical History:  Diagnosis Date  . Anemia    in past  . Colon polyps   . Diverticulosis   . Gallstones   . GERD (gastroesophageal reflux disease)   . Heart attack ([place])    mild, Spring 2017  . Heart murmur   . Hemorrhoid   . Hiatal hernia   . HTN (hypertension)   . Hypothyroidism   . Iron deficiency anemia due to chronic blood loss 03/29/2019  . 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 Metastatic carcinoma ([place]) 2010   Dr Jonette Eva  . Pancreatitis 1998   chronic    Past Surgical History:  Procedure Laterality Date  . APPENDECTOMY  1962  . CARDIAC CATHETERIZATION N/A 02/10/2016   Procedure: Left Heart Cath and Coronary Angiography;  Surgeon: Adrian Prows, MD;  Location: [facility];  Service: Cardiovascular;  Laterality: N/A;  .  CARDIAC CATHETERIZATION  02/10/2016   Procedure: Coronary/Graft Atherectomy;  Surgeon: Adrian Prows, MD;  Location: [facility];  Service: Cardiovascular;;  . CARDIAC CATHETERIZATION  02/10/2016   Procedure: Coronary Stent Intervention;  Surgeon: Adrian Prows, MD;  Location: [facility];  Service: Cardiovascular;;  . CATARACT EXTRACTION W/ INTRAOCULAR LENS  IMPLANT, BILATERAL Bilateral   . IR GENERIC HISTORICAL  12/30/2015   IR RADIOLOGIST EVAL & MGMT 12/30/2015 Aletta Edouard, MD [facility]  . IR GENERIC HISTORICAL  11/09/2016   IR RADIOLOGIST EVAL & MGMT 11/09/2016 Aletta Edouard, MD [facility] INTER[facility] RAD  . IR RADIOLOGIST EVAL & MGMT  03/01/2017  . IR RADIOLOGIST EVAL & MGMT  06/29/2017  . LAPAROSCOPIC CHOLECYSTECTOMY  1999  . LIVER BIOPSY  2010  . RADIOACTIVE SEED IMPLANT  X 3   "to my  liver"  . TUMOR EXCISION  01/2009   Carcinoil Resection   . TUMOR REMOVAL     from small intestine     Home Medications:  Prior to Admission medications   Medication Sig Start Date End Date Taking? Authorizing Provider  amLODipine (NORVASC) 5 MG tablet Take 5 mg by mouth daily. 03/16/17  Yes [provider]  aspirin EC 81 MG tablet Take 81 mg by mouth every morning.   Yes [provider]  atorvastatin (LIPITOR) 20 MG tablet Take 20 mg daily by mouth.   Yes [provider]  Cholecalciferol 1000 UNITS tablet Take 1,000 Units by mouth daily.     Yes [provider]  fluticasone (FLONASE) 50 MCG/ACT nasal spray Place 2 sprays into both nostrils daily as needed (sinuses).  01/06/15  Yes [provider]  hydrocortisone (ANUSOL-HC) 25 MG suppository INSERT 1 SUPPOSITORY RECTALLY TWICE A DAY AS NEEDED FOR HEMORRHOIDS 02/07/15  Yes [place], Holli Humbles, NP  Lanreotide Acetate (SOMATULINE DEPOT [place]) Inject 120 mcg into the skin every 28 (twenty-eight) days. Receives at Dr Antonieta Pert office   Yes [provider]  lansoprazole (PREVACID) 30 MG capsule Take 1 capsule (30 mg total) by mouth daily at 12 noon. 11/19/16  Yes [place], Gay Filler, MD  levothyroxine (SYNTHROID, LEVOTHROID) 50 MCG tablet Take 1 tablet (50 mcg total) by mouth daily. 08/09/17  Yes Cop[place], Gay Filler, MD  naproxen (NAPROSYN) 500 MG tablet Take 1 tablet (500 mg total) by mouth 2 (two) times daily with a meal. 08/16/17  Yes Leandrew Koyanagi, MD  nitroGLYCERIN (NITROSTAT) 0.4 MG SL tablet Place 1 tablet (0.4 mg total) under the tongue every 5 (five) minutes x 3 doses as needed for chest pain. 02/11/16  Yes Adrian Prows, MD  Omega-3 Fatty Acids (FISH OIL) 1000 MG CAPS Take 1 capsule by mouth daily. Reported on 12/30/2015   Yes [provider]  predniSONE (DELTASONE) 50 MG tablet Take 50mg  at 13 hours, 7 hours, and 1 hour before scan 02/18/17  Yes [place], Holli Humbles, NP  sodium chloride (OCEAN)  0.65 % SOLN nasal spray Place 1 spray into both nostrils as needed for congestion.    Yes [provider]  ticagrelor (BRILINTA) 90 MG TABS tablet Take 1 tablet (90 mg total) by mouth 2 (two) times daily. 02/11/16  Yes Adrian Prows, MD  tiZANidine (ZANAFLEX) 4 MG tablet Take 1 tablet (4 mg total) by mouth every 6 (six) hours as needed for muscle spasms. 08/16/17  Yes Leandrew Koyanagi, MD  vitamin B-12 (CYANOCOBALAMIN) 1000 MCG tablet Take 1,000 mcg by mouth daily.   Yes [provider]  diphenhydrAMINE (BENADRYL) 50 MG tablet Take 1 tablet (50 mg total) by mouth once. Take 1 hour prior to scan. 02/18/17 02/18/17  [place], Holli Humbles, NP  metoprolol tartrate (LOPRESSOR) 25 MG tablet Take 0.5 tablets (12.5 mg total) 2 (two) times daily by mouth. 09/14/17   , Wonda Cheng, MD     Allergies:    Allergies  Allergen Reactions  . Iohexol Hives     Code: HIVES, Desc: PER [name] @ [facility]IMARY [facility], PT IS ALLERGIC TO CONTRAST DYE 10/02/08/RM  05/01/10...needs full premeds per our protocol w/ [facility]., Onset Date: [id number]     Social History:   Social History   Socioeconomic History  . Marital status: Married    Spouse name: Not on file  . Number of children: 1  . Years of education: Not on file  . Highest education level: Not on file  Occupational History  . 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  Social Needs  . Financial resource strain: Not on file  . Food insecurity    Worry: Not on file    Inability: Not on file  . Transportation needs    Medical: Not on file    Non-medical: Not on file  Tobacco Use  . Smoking status: Former Smoker    Packs/day: 1.50    Years: 35.00    Pack years: 52.50    Types: Cigarettes    Start date: 09/25/1959    Quit date: 12/21/1997    Years since quitting: 21.6  . Smokeless tobacco: Never Used  Substance and Sexual Activity  . Alcohol use: Yes    Alcohol/week: 3.0 - 4.0 standard drinks    Types: 3 - 4 Cans of beer per week     Comment: 4 beers weekly   . Drug use: No  . Sexual activity: Yes  Lifestyle  . Physical activity    Days per week: Not on file    Minutes per session: Not on file  . Stress: Not on file  Relationships  . Social Herbalist on phone: Not on file    Gets together: Not on file    Attends religious service: Not on file    Active member of club or organization: Not on file    Attends meetings of clubs or organizations: Not on file    Relationship status: Not on file  . Intimate partner violence    Fear of current or ex partner: Not on file    Emotionally abused: Not on file    Physically abused: Not on file    Forced sexual activity: Not on file  Other Topics Concern  . Not on file  Social History Narrative   Regular Exercise -  NO    Family History:    Family History  Problem Relation Age of Onset  . Kidney disease Mother   . Hyperlipidemia Mother   . Hypertension Mother   . COPD Father   . Ulcerative colitis Daughter      ROS:  Noted in current history, all other systems are negative.   Physical Exam: Blood pressure (!) 146/74, pulse 65, height 5' 10.5" (1.791 m), weight 207 lb (93.9 kg), SpO2 97 %.  GEN:  Well nourished, well developed in no acute distress HEENT: Normal NECK: No JVD; No carotid bruits LYMPHATICS: No lymphadenopathy CARDIAC: RRR  1-2 / 6 systolic murmur  RESPIRATORY:  Clear to auscultation without rales, wheezing or rhonchi  ABDOMEN: Soft, non-tender, non-distended MUSCULOSKELETAL:  No edema; No deformity  SKIN: Warm and dry NEUROLOGIC:  Alert and oriented x 3   EKG:     August 26, 2019: Normal sinus rhythm at 65 beats a minute.  First-degree AV block.  Otherwise normal EKG.  Relevant CV Studies:    Laboratory Data:  ChemistryNo results for input(s): NA, K, CL, CO2, GLUCOSE, BUN, CREATININE, CALCIUM, GFRNONAA, GFRAA, ANIONGAP in the last 168 hours.  No results for input(s): PROT, ALBUMIN, AST, ALT, ALKPHOS, BILITOT in the last  168 hours. HematologyNo results for input(s): WBC, RBC, HGB, HCT, MCV, MCH, MCHC, RDW, PLT in the last 168 hours. Cardiac EnzymesNo results for input(s): TROPONINI in the last 168 hours. No results for input(s): TROPIPOC in the last 168 hours.  BNPNo results for input(s): BNP, PROBNP in the last 168 hours.  DDimer No results for input(s): DDIMER in the last 168 hours.  Radiology/Studies:  No results found.  Assessment and Plan:     1. Coronary artery disease:     He is doing very well.  Is not had any episodes of chest pain or shortness of breath  2.  Hypertension:     Blood pressures typically well controlled.  Continue current medications.  Advised him to watch his salt intake.  3.  Hyperlipidemia: Lipid levels are well controlled.  Continue current medications.  4.   Systolic murmur :     He has mild mitral regurgitation and mild aortic insufficiency.  No significant valvular abnormalities at this time.  For questions or updates, please contact [place] Please consult [email or web address] for contact info under Cardiology/STEMI.   Signed, Mertie Moores, MD  08/06/2019 10:39 AM

## 2019-08-23 ENCOUNTER — Other Ambulatory Visit: Payer: Self-pay | Admitting: Cardiovascular Disease

## 2019-08-27 ENCOUNTER — Other Ambulatory Visit: Payer: Self-pay | Admitting: Cardiovascular Disease

## 2019-08-30 ENCOUNTER — Other Ambulatory Visit: Payer: Self-pay | Admitting: Cardiovascular Disease

## 2019-08-30 DIAGNOSIS — E7800 Pure hypercholesterolemia, unspecified: Secondary | ICD-10-CM

## 2019-08-31 ENCOUNTER — Inpatient Hospital Stay: Payer: Medicare HMO

## 2019-08-31 ENCOUNTER — Inpatient Hospital Stay: Payer: Medicare HMO | Attending: Hematology & Oncology | Admitting: Hematology & Oncology

## 2019-08-31 ENCOUNTER — Other Ambulatory Visit: Payer: Self-pay

## 2019-08-31 ENCOUNTER — Encounter: Payer: Self-pay | Admitting: Hematology & Oncology

## 2019-08-31 VITALS — BP 132/59 | HR 66 | Temp 97.8°F | Resp 18 | Wt 207.0 lb

## 2019-08-31 DIAGNOSIS — C7A8 Other malignant neuroendocrine tumors: Secondary | ICD-10-CM | POA: Diagnosis present

## 2019-08-31 DIAGNOSIS — D509 Iron deficiency anemia, unspecified: Secondary | ICD-10-CM | POA: Diagnosis not present

## 2019-08-31 DIAGNOSIS — C7B8 Other secondary neuroendocrine tumors: Secondary | ICD-10-CM | POA: Insufficient documentation

## 2019-08-31 DIAGNOSIS — R197 Diarrhea, unspecified: Secondary | ICD-10-CM | POA: Diagnosis not present

## 2019-08-31 DIAGNOSIS — D500 Iron deficiency anemia secondary to blood loss (chronic): Secondary | ICD-10-CM

## 2019-08-31 LAB — CMP (CANCER CENTER ONLY)
ALT: 17 U/L (ref 0–44)
AST: 25 U/L (ref 15–41)
Albumin: 4.3 g/dL (ref 3.5–5.0)
Alkaline Phosphatase: 62 U/L (ref 38–126)
Anion gap: 7 (ref 5–15)
BUN: 18 mg/dL (ref 8–23)
CO2: 28 mmol/L (ref 22–32)
Calcium: 9.5 mg/dL (ref 8.9–10.3)
Chloride: 104 mmol/L (ref 98–111)
Creatinine: 1.05 mg/dL (ref 0.61–1.24)
GFR, Est AFR Am: >60 mL/min (ref >60)
GFR, Estimated: >60 mL/min (ref >60)
Glucose, Bld: 117 mg/dL — ABNORMAL HIGH (ref 70–99)
Potassium: 3.9 mmol/L (ref 3.5–5.1)
Sodium: 139 mmol/L (ref 135–145)
Total Bilirubin: 0.7 mg/dL (ref 0.3–1.2)
Total Protein: 7.5 g/dL (ref 6.5–8.1)

## 2019-08-31 LAB — CBC WITH DIFFERENTIAL (CANCER CENTER ONLY)
Abs Immature Granulocytes: 0.04 10*3/uL (ref 0.00–0.07)
Basophils Absolute: 0 10*3/uL (ref 0.0–0.1)
Basophils Relative: 1 %
Eosinophils Absolute: 0.3 10*3/uL (ref 0.0–0.5)
Eosinophils Relative: 5 %
HCT: 39.1 % (ref 39.0–52.0)
Hemoglobin: 12.7 g/dL — ABNORMAL LOW (ref 13.0–17.0)
Immature Granulocytes: 1 %
Lymphocytes Relative: 17 %
Lymphs Abs: 1.1 10*3/uL (ref 0.7–4.0)
MCH: 30.9 pg (ref 26.0–34.0)
MCHC: 32.5 g/dL (ref 30.0–36.0)
MCV: 95.1 fL (ref 80.0–100.0)
Monocytes Absolute: 0.4 10*3/uL (ref 0.1–1.0)
Monocytes Relative: 7 %
Neutro Abs: 4.5 10*3/uL (ref 1.7–7.7)
Neutrophils Relative %: 69 %
Platelet Count: 158 10*3/uL (ref 150–400)
RBC: 4.11 MIL/uL — ABNORMAL LOW (ref 4.22–5.81)
RDW: 14.4 % (ref 11.5–15.5)
WBC Count: 6.3 10*3/uL (ref 4.0–10.5)
nRBC: 0 % (ref 0.0–0.2)

## 2019-08-31 MED ORDER — LANREOTIDE ACETATE 120 MG/0.5ML ~~LOC~~ SOLN
120.0000 mg | Freq: Once | SUBCUTANEOUS | Status: AC
  Administered 2019-08-31: 120 mg via SUBCUTANEOUS

## 2019-08-31 NOTE — Progress Notes (Signed)
Hematology and Oncology Follow Up Visit  Brian Mays [id number] [birth year] 73 y.o. 09/05/2017    Principle Diagnosis:  Metastatic low grade neuroendocrine tumor-hepatic metastases Iron def anemia  Current Therapy:   S/p yttrium-90 intrahepatic therapy - November 2016 Somatuline 120 mg monthly Lutathera (Lu 177) injection on 02/01/2018 - s/p cycle #4 IV Iron w/ Injectafer -- dose given on 04/29/2019    Interim History: Brian Mays is back for follow-up.  He actually is doing pretty well.  Unfortunately, he has not yet had his dotatate scan.  I think the insurance finally approved it but we have to reschedule it.  His last Chromogranin A level was down to 124.  This must be encouraging for me.  He still has some diarrhea.  Unfortunately, we cannot have any Creon samples for him.  The Creon is quite expensive.  He has had no problems with fever.  There is been no cough.  His blood pressure is under much better control.  There is been no problems with leg swelling.  He has had no headache.  Overall, I would say his performance status is ECOG 1.  He and his [place] are about the [place] [facility] for a week at a resort.   Medications:  Allergies as of 09/05/2017      Reactions   Iohexol Hives    Code: HIVES, Desc: PER [name] @ [facility], PT IS ALLERGIC TO CONTRAST DYE 10/02/08/RM  05/01/10...needs full premeds per our protocol w/ [facility]., Onset Date: [id number]      Medication List        Accurate as of 09/05/17 11:45 AM. Always use your most recent med list.          amLODipine 5 MG tablet Commonly known as:  NORVASC Take 5 mg by mouth daily.   aspirin EC 81 MG tablet Take 81 mg by mouth every morning.   atorvastatin 20 MG tablet Commonly known as:  LIPITOR Take 20 mg daily by mouth.   Cholecalciferol 1000 units tablet Take 1,000 Units by mouth daily.   diphenhydrAMINE 50 MG tablet Commonly known as:  BENADRYL Take 1 tablet  (50 mg total) by mouth once. Take 1 hour prior to scan.   Fish Oil 1000 MG Caps Take 1 capsule by mouth daily. Reported on 12/30/2015   fluticasone 50 MCG/ACT nasal spray Commonly known as:  FLONASE Place 2 sprays into both nostrils daily as needed (sinuses).   hydrocortisone 25 MG suppository Commonly known as:  ANUSOL-HC INSERT 1 SUPPOSITORY RECTALLY TWICE A DAY AS NEEDED FOR HEMORRHOIDS   lansoprazole 30 MG capsule Commonly known as:  PREVACID Take 1 capsule (30 mg total) by mouth daily at 12 noon.   levothyroxine 50 MCG tablet Commonly known as:  SYNTHROID, LEVOTHROID Take 1 tablet (50 mcg total) by mouth daily.   losartan 100 MG tablet Commonly known as:  COZAAR TAKE 1 TABLET (100 MG TOTAL) BY MOUTH AT BEDTIME.   metoprolol tartrate 25 MG tablet Commonly known as:  LOPRESSOR Take 1 tablet (25 mg total) by mouth 2 (two) times daily.   naproxen 500 MG tablet Commonly known as:  NAPROSYN Take 1 tablet (500 mg total) by mouth 2 (two) times daily with a meal.   nitroGLYCERIN 0.4 MG SL tablet Commonly known as:  NITROSTAT Place 1 tablet (0.4 mg total) under the tongue every 5 (five) minutes x 3 doses as needed for chest pain.   predniSONE  50 MG tablet Commonly known as:  DELTASONE Take 50mg  at 13 hours, 7 hours, and 1 hour before scan   sodium chloride 0.65 % Soln nasal spray Commonly known as:  OCEAN Place 1 spray into both nostrils as needed for congestion.   SOMATULINE DEPOT  Inject 120 mcg into the skin every 28 (twenty-eight) days. Receives at Dr Antonieta Pert office   ticagrelor 90 MG Tabs tablet Commonly known as:  BRILINTA Take 1 tablet (90 mg total) by mouth 2 (two) times daily.   tiZANidine 4 MG tablet Commonly known as:  ZANAFLEX Take 1 tablet (4 mg total) by mouth every 6 (six) hours as needed for muscle spasms.   vitamin B-12 1000 MCG tablet Commonly known as:  CYANOCOBALAMIN Take 1,000 mcg by mouth daily.       Allergies:  Allergies  Allergen  Reactions  . Iohexol Hives     Code: HIVES, Desc: PER [name] @ [facility], PT IS ALLERGIC TO CONTRAST DYE 10/02/08/RM  05/01/10...needs full premeds per our protocol w/ [facility]., Onset Date: [id number]     Past Medical History, Surgical history, Social history, and Family History were reviewed and updated.  Review of Systems: Review of Systems  Constitutional: Negative for appetite change, fatigue, fever and unexpected weight change.  HENT:   Negative for lump/mass, mouth sores, sore throat and trouble swallowing.   Respiratory: Negative for cough, hemoptysis and shortness of breath.   Cardiovascular: Negative for leg swelling and palpitations.  Gastrointestinal: Negative for abdominal distention, abdominal pain, blood in stool, constipation, diarrhea, nausea and vomiting.  Genitourinary: Negative for bladder incontinence, dysuria, frequency and hematuria.   Musculoskeletal: Negative for arthralgias, back pain, gait problem and myalgias.  Skin: Negative for itching and rash.  Neurological: Negative for dizziness, extremity weakness, gait problem, headaches, numbness, seizures and speech difficulty.  Hematological: Does not bruise/bleed easily.  Psychiatric/Behavioral: Negative for depression and sleep disturbance. The patient is not nervous/anxious.      Physical Exam:  weight is 209 lb 4 oz (92.2 kg). His oral temperature is 98.3 F (36.8 C). His blood pressure is 113/63 and his pulse is 70. His respiration is 18 and oxygen saturation is 98%.   Wt Readings from Last 3 Encounters:  09/05/17 203 lb 4 oz (92.2 kg)  08/25/17 205 lb (93 kg)  07/25/17 205 lb (93 kg)    Physical Exam Vitals signs reviewed.  HENT:     Head: Normocephalic and atraumatic.  Eyes:     Pupils: Pupils are equal, round, and reactive to light.  Neck:     Musculoskeletal: Normal range of motion.  Cardiovascular:     Rate and Rhythm: Normal rate and regular rhythm.     Heart sounds: Normal heart sounds.   Pulmonary:     Effort: Pulmonary effort is normal.     Breath sounds: Normal breath sounds.  Abdominal:     General: Bowel sounds are normal.     Palpations: Abdomen is soft.  Musculoskeletal: Normal range of motion.        General: No tenderness or deformity.  Lymphadenopathy:     Cervical: No cervical adenopathy.  Skin:    General: Skin is warm and dry.     Findings: No erythema or rash.  Neurological:     Mental Status: He is alert and oriented to person, place, and time.  Psychiatric:        Behavior: Behavior normal.        Thought Content: Thought content  normal.        Judgment: Judgment normal.   .   Lab Results  Component Value Date   WBC 6.4 09/05/2017   HGB 12.9 (L) 09/05/2017   HCT 40.1 09/05/2017   MCV 83 09/05/2017   PLT 142 (L) 09/05/2017   No results found for: FERRITIN, IRON, TIBC, UIBC, IRONPCTSAT Lab Results  Component Value Date   RBC 4.81 09/05/2017   No results found for: KPAFRELGTCHN, LAMBDASER, KAPLAMBRATIO No results found for: IGGSERUM, IGA, IGMSERUM No results found for: Odetta Pink, SPEI   Chemistry      Component Value Date/Time   NA 145 09/05/2017 1015   NA 141 07/23/2016 1255   K 4.2 09/05/2017 1015   K 4.1 07/23/2016 1255   CL 105 09/05/2017 1015   CO2 29 09/05/2017 1015   CO2 27 07/23/2016 1255   BUN 16 09/05/2017 1015   BUN 13.8 07/23/2016 1255   CREATININE 1.1 09/05/2017 1015   CREATININE 1.0 07/23/2016 1255      Component Value Date/Time   CALCIUM 9.9 09/05/2017 1015   CALCIUM 9.2 07/23/2016 1255   ALKPHOS 66 09/05/2017 1015   ALKPHOS 77 07/23/2016 1255   AST 38 09/05/2017 1015   AST 23 07/23/2016 1255   ALT 31 09/05/2017 1015   ALT 13 07/23/2016 1255   BILITOT 1.00 09/05/2017 1015   BILITOT 1.13 07/23/2016 1255     Impression and Plan: Mr. Alvardo is a 73 year old white male. He has metastatic low-grade neuroendocrine carcinoma. He has hepatic metastases.   Hopefully, we will be able to get the dotatate scan on him.  I will see him back in 4 weeks.  Given that the Chromogranin A level is coming down, this must be a good sign for [place].  Certainly, his physical exam is encouraging.      Burney Gauze, MD  11/5/201811:45 AM

## 2019-08-31 NOTE — Patient Instructions (Signed)
Lanreotide injection What is this medicine? LANREOTIDE (lan REE oh tide) is used to reduce blood levels of growth hormone in patients with a condition called acromegaly. It also works to slow or stop tumor growth in patients with neuroendocrine tumors and treat carcinoid syndrome. This medicine may be used for other purposes; ask your health care provider or pharmacist if you have questions. COMMON BRAND NAME(S): Somatuline [place] What should I tell my health care provider before I take this medicine? They need to know if you have any of these conditions:  diabetes  gallbladder disease  heart disease  kidney disease  liver disease  thyroid disease  an unusual or allergic reaction to lanreotide, other medicines, foods, dyes, or preservatives  pregnant or trying to get pregnant  breast-feeding How should I use this medicine? This medicine is for injection under the skin. It is given by a health care professional in a hospital or clinic setting. Contact your pediatrician or health care professional regarding the use of this medicine in children. Special care may be needed. Overdosage: If you think you have taken too much of this medicine contact a poison control center or emergency room at once. NOTE: This medicine is only for you. Do not share this medicine with others. What if I miss a dose? It is important not to miss your dose. Call your doctor or health care professional if you are unable to keep an appointment. What may interact with this medicine? This medicine may interact with the following medications:  bromocriptine  cyclosporine  certain medicines for blood pressure, heart disease, irregular heart beat  certain medicines for diabetes  quinidine  terfenadine This list may not describe all possible interactions. Give your health care provider a list of all the medicines, herbs, non-prescription drugs, or dietary supplements you use. Also tell them if you smoke,  drink alcohol, or use illegal drugs. Some items may interact with your medicine. What should I watch for while using this medicine? Tell your doctor or healthcare professional if your symptoms do not start to get better or if they get worse. Visit your doctor or health care professional for regular checks on your progress. Your condition will be monitored carefully while you are receiving this medicine. This medicine may increase blood sugar. Ask your healthcare provider if changes in diet or medicines are needed if you have diabetes. You may need blood work done while you are taking this medicine. Women should inform their doctor if they wish to become pregnant or think they might be pregnant. There is a potential for serious side effects to an unborn child. Talk to your health care professional or pharmacist for more information. Do not breast-feed an infant while taking this medicine or for 6 months after stopping it. This medicine has caused ovarian failure in some women. This medicine may interfere with the ability to have a child. Talk with your doctor or health care professional if you are concerned about your fertility. What side effects may I notice from receiving this medicine? Side effects that you should report to your doctor or health care professional as soon as possible:  allergic reactions like skin rash, itching or hives, swelling of the face, lips, or tongue  increased blood pressure  severe stomach pain  signs and symptoms of hgh blood sugar such as being more thirsty or hungry or having to urinate more than normal. You may also feel very tired or have blurry vision.  signs and symptoms of low blood   sugar such as feeling anxious; confusion; dizziness; increased hunger; unusually weak or tired; sweating; shakiness; cold; irritable; headache; blurred vision; fast heartbeat; loss of consciousness  unusually slow heartbeat Side effects that usually do not require medical  attention (report to your doctor or health care professional if they continue or are bothersome):  constipation  diarrhea  dizziness  headache  muscle pain  muscle spasms  nausea  pain, redness, or irritation at site where injected This list may not describe all possible side effects. Call your doctor for medical advice about side effects. You may report side effects to FDA at 1-800-FDA-1088. Where should I keep my medicine? This drug is given in a hospital or clinic and will not be stored at home. NOTE: This sheet is a summary. It may not cover all possible information. If you have questions about this medicine, talk to your doctor, pharmacist, or health care provider.  2020 Elsevier/Gold Standard (2018-07-27 09:13:08)  

## 2019-09-03 LAB — CHROMOGRANIN A: Chromogranin A (ng/mL): 152.7 ng/mL — ABNORMAL HIGH (ref 0.0–101.8)

## 2019-09-12 ENCOUNTER — Ambulatory Visit (HOSPITAL_COMMUNITY)
Admission: RE | Admit: 2019-09-12 | Discharge: 2019-09-12 | Disposition: A | Discharge status: Home / Self Care | Payer: Medicare HMO | Source: Ambulatory Visit | Attending: Hematology & Oncology | Admitting: Hematology & Oncology

## 2019-09-12 ENCOUNTER — Other Ambulatory Visit: Payer: Self-pay

## 2019-09-12 DIAGNOSIS — C7B8 Other secondary neuroendocrine tumors: Secondary | ICD-10-CM

## 2019-09-12 DIAGNOSIS — C7A1 Malignant poorly differentiated neuroendocrine tumors: Secondary | ICD-10-CM | POA: Diagnosis not present

## 2019-09-12 IMAGING — CT NM PET SKULL BASE TO THIGH
1 of 9 series · 1 of 25 positions shown · non-contrast
Comparison: CT [DATE], DOTATATE PET-CT [DATE]

CLINICAL DATA: Well differentiated neuroendocrine tumor. Patient
status post Lu 177 DOTATATE therapy completed 4 cycles on
[DATE]. Tumor marker (chromogranin A) decreasing.

EXAM:
NUCLEAR MEDICINE PET SKULL BASE TO THIGH
TECHNIQUE: 4.8 mCi Ga 68 DOTATATE was injected intravenously. Full-ring PET
imaging was performed from the skull base to thigh after the
radiotracer. CT data was obtained and used for attenuation
correction and anatomic localization.

[Series 4: ct sk_thigh 5.0 b31f · axial · 5.0mm · 0.98mm/px · 1 of 238 slices shown]
[im 238/238  brain]
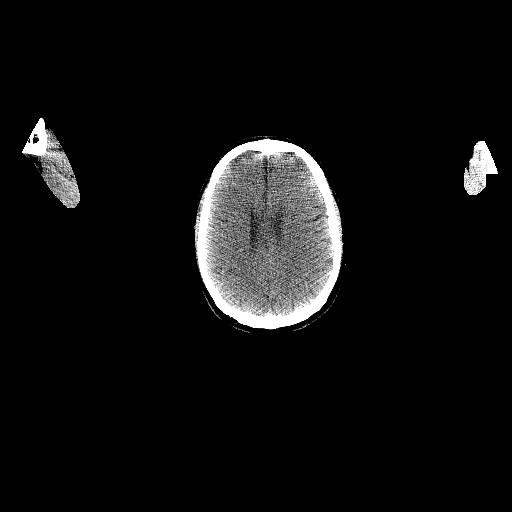

[1 of 25 positions shown; findings below may reference images not displayed]

FINDINGS: NECK

No radiotracer activity in neck lymph nodes.

Incidental CT findings: None

CHEST

No radiotracer accumulation within mediastinal or hilar lymph nodes.
No suspicious pulmonary nodules on the CT scan.

Incidental CT finding:None

ABDOMEN/PELVIS

Again demonstrated intense radiotracer activity associated with the
multiple metastasis in the liver. The activity, size and number
lesions is near dental to comparison exam. Example lesion the LEFT
lateral hepatic lobe measuring 4.9 cm (image 104/4) with SUV max
equals 47.5 compares to 4.4 cm lesion on PET-CT [DATE] with SUV
max equal 41.4.

Lesion in the more central LEFT hepatic lobe with SUV max equal
compares to 17 8.

Large partially exophytic lesion extending from the inferior margin
RIGHT hepatic lobe measures 8.7 cm compared to 8.5 cm with a rim of
intense radiotracer activity (SUV max equal 41.8 compared with SUV
max equal 37.5).

Small central mesenteric mass with central calcification measures
2.3 cm (image 147/4) compared to 2.5 cm on comparison DOTATATE PET
scan. Lesion has intense radiotracer activity SUV max equal 38
compared SUV max 44.

Intense metabolic activity associated with a lymph node the porta
hepatis is unchanged.

No new disease in the bowel or peritoneal space.

Physiologic activity noted in the liver, spleen, adrenal glands and
kidneys.

Incidental CT findings:None

SKELETON

Single foci of radiotracer activity in the T12 vertebral body is
small difficult to measure on CT exam. The lesion continues to have
radiotracer activity with SUV max equal 6.6 compared to

Incidental CT findings:None
IMPRESSION: 1. Overall stable exam compared to DOTATATE PET scan [DATE]. The
exam appears near identical with no evidence of disease progression
or new metastatic disease.
2. Persistent metastatic well differentiated neuroendocrine tumor
within the liver, central mesenteric partially calcified mass and
single small skeletal metastasis at T12.

## 2019-09-12 MED ORDER — GALLIUM GA 68 DOTATATE IV KIT
4.8000 | PACK | Freq: Once | INTRAVENOUS | Status: AC | PRN
  Administered 2019-09-12: 4.8 via INTRAVENOUS

## 2019-09-28 ENCOUNTER — Encounter: Payer: Self-pay | Admitting: Family Medicine

## 2019-10-03 ENCOUNTER — Encounter: Payer: Self-pay | Admitting: Hematology & Oncology

## 2019-10-04 ENCOUNTER — Other Ambulatory Visit: Payer: Self-pay | Admitting: *Deleted

## 2019-10-04 DIAGNOSIS — D500 Iron deficiency anemia secondary to blood loss (chronic): Secondary | ICD-10-CM

## 2019-10-04 DIAGNOSIS — C7B8 Other secondary neuroendocrine tumors: Secondary | ICD-10-CM

## 2019-10-05 ENCOUNTER — Encounter: Payer: Self-pay | Admitting: Hematology & Oncology

## 2019-10-05 ENCOUNTER — Inpatient Hospital Stay: Payer: Medicare HMO

## 2019-10-05 ENCOUNTER — Inpatient Hospital Stay: Payer: Medicare HMO | Attending: Hematology & Oncology | Admitting: Hematology & Oncology

## 2019-10-05 ENCOUNTER — Other Ambulatory Visit: Payer: Self-pay

## 2019-10-05 VITALS — BP 138/75 | HR 74 | Temp 97.3°F | Resp 20 | Wt 210.0 lb

## 2019-10-05 DIAGNOSIS — D500 Iron deficiency anemia secondary to blood loss (chronic): Secondary | ICD-10-CM

## 2019-10-05 DIAGNOSIS — C7B8 Other secondary neuroendocrine tumors: Secondary | ICD-10-CM

## 2019-10-05 DIAGNOSIS — C7A8 Other malignant neuroendocrine tumors: Secondary | ICD-10-CM | POA: Diagnosis present

## 2019-10-05 DIAGNOSIS — D509 Iron deficiency anemia, unspecified: Secondary | ICD-10-CM | POA: Insufficient documentation

## 2019-10-05 LAB — CMP (CANCER CENTER ONLY)
ALT: 16 U/L (ref 0–44)
AST: 21 U/L (ref 15–41)
Albumin: 4.2 g/dL (ref 3.5–5.0)
Alkaline Phosphatase: 61 U/L (ref 38–126)
Anion gap: 7 (ref 5–15)
BUN: 16 mg/dL (ref 8–23)
CO2: 30 mmol/L (ref 22–32)
Calcium: 9.4 mg/dL (ref 8.9–10.3)
Chloride: 100 mmol/L (ref 98–111)
Creatinine: 1.01 mg/dL (ref 0.61–1.24)
GFR, Est AFR Am: >60 mL/min (ref >60)
GFR, Estimated: >60 mL/min (ref >60)
Glucose, Bld: 116 mg/dL — ABNORMAL HIGH (ref 70–99)
Potassium: 4.6 mmol/L (ref 3.5–5.1)
Sodium: 137 mmol/L (ref 135–145)
Total Bilirubin: 0.8 mg/dL (ref 0.3–1.2)
Total Protein: 7.8 g/dL (ref 6.5–8.1)

## 2019-10-05 LAB — CBC WITH DIFFERENTIAL (CANCER CENTER ONLY)
Abs Immature Granulocytes: 0.01 10*3/uL (ref 0.00–0.07)
Basophils Absolute: 0 10*3/uL (ref 0.0–0.1)
Basophils Relative: 1 %
Eosinophils Absolute: 0.3 10*3/uL (ref 0.0–0.5)
Eosinophils Relative: 4 %
HCT: 39.3 % (ref 39.0–52.0)
Hemoglobin: 12.9 g/dL — ABNORMAL LOW (ref 13.0–17.0)
Immature Granulocytes: 0 %
Lymphocytes Relative: 18 %
Lymphs Abs: 1.2 10*3/uL (ref 0.7–4.0)
MCH: 30.9 pg (ref 26.0–34.0)
MCHC: 32.8 g/dL (ref 30.0–36.0)
MCV: 94.2 fL (ref 80.0–100.0)
Monocytes Absolute: 0.5 10*3/uL (ref 0.1–1.0)
Monocytes Relative: 8 %
Neutro Abs: 4.5 10*3/uL (ref 1.7–7.7)
Neutrophils Relative %: 69 %
Platelet Count: 158 10*3/uL (ref 150–400)
RBC: 4.17 MIL/uL — ABNORMAL LOW (ref 4.22–5.81)
RDW: 13.5 % (ref 11.5–15.5)
WBC Count: 6.6 10*3/uL (ref 4.0–10.5)
nRBC: 0 % (ref 0.0–0.2)

## 2019-10-05 MED ORDER — LANREOTIDE ACETATE 120 MG/0.5ML ~~LOC~~ SOLN
120.0000 mg | Freq: Once | SUBCUTANEOUS | Status: AC
  Administered 2019-10-05: 120 mg via SUBCUTANEOUS

## 2019-10-05 NOTE — Progress Notes (Signed)
Hematology and Oncology Follow Up Visit  Brian Mays:[id number] January 20, 1946 73 y.o. 09/05/2017    Principle Diagnosis:  Metastatic low grade neuroendocrine tumor-hepatic metastases Iron def anemia  Current Therapy:   S/p yttrium-90 intrahepatic therapy - November 2016 Somatuline 120 mg monthly Lutathera (Lu 177) injection on 02/01/2018 - s/p cycle #4 IV Iron w/ Injectafer -- dose given on 04/29/2019    Interim History: Brian Mays is back for follow-up.  He actually is doing pretty well.   He really has had no complaints since we last saw him.  He did go ahead and get the dotatate PET scan done.  This was done on September 12, 2019.  Thankfully, the scan did not show any evidence of progressive disease.  His scan basically looked stable as compared to his last scan which was in December 2019.  His last Chromogranin A level was 153.  This is holding relatively stable.  He and his family had a wonderful trip up the [place] area of [place].  They really enjoyed themselves.  He has had no problems with blood pressure.  He has had no cardiac issues.  He has had no nausea or vomiting.  There is been no diarrhea.  Overall, his performance status is ECOG 1.  Medications:  Allergies as of 09/05/2017      Reactions   Iohexol Hives    Code: HIVES, Desc: PER [name] @ [facility], PT IS ALLERGIC TO CONTRAST DYE 10/02/08/RM  05/01/10...needs full premeds per our protocol w/ [facility]., Onset Date: [id number]      Medication List        Accurate as of 09/05/17 11:45 AM. Always use your most recent med list.          amLODipine 5 MG tablet Commonly known as:  NORVASC Take 5 mg by mouth daily.   aspirin EC 81 MG tablet Take 81 mg by mouth every morning.   atorvastatin 20 MG tablet Commonly known as:  LIPITOR Take 20 mg daily by mouth.   Cholecalciferol 1000 units tablet Take 1,000 Units by mouth daily.   diphenhydrAMINE 50 MG tablet Commonly known as:  BENADRYL Take 1 tablet  (50 mg total) by mouth once. Take 1 hour prior to scan.   Fish Oil 1000 MG Caps Take 1 capsule by mouth daily. Reported on 12/30/2015   fluticasone 50 MCG/ACT nasal spray Commonly known as:  FLONASE Place 2 sprays into both nostrils daily as needed (sinuses).   hydrocortisone 25 MG suppository Commonly known as:  ANUSOL-HC INSERT 1 SUPPOSITORY RECTALLY TWICE A DAY AS NEEDED FOR HEMORRHOIDS   lansoprazole 30 MG capsule Commonly known as:  PREVACID Take 1 capsule (30 mg total) by mouth daily at 12 noon.   levothyroxine 50 MCG tablet Commonly known as:  SYNTHROID, LEVOTHROID Take 1 tablet (50 mcg total) by mouth daily.   losartan 100 MG tablet Commonly known as:  COZAAR TAKE 1 TABLET (100 MG TOTAL) BY MOUTH AT BEDTIME.   metoprolol tartrate 25 MG tablet Commonly known as:  LOPRESSOR Take 1 tablet (25 mg total) by mouth 2 (two) times daily.   naproxen 500 MG tablet Commonly known as:  NAPROSYN Take 1 tablet (500 mg total) by mouth 2 (two) times daily with a meal.   nitroGLYCERIN 0.4 MG SL tablet Commonly known as:  NITROSTAT Place 1 tablet (0.4 mg total) under the tongue every 5 (five) minutes x 3 doses as needed for chest pain.   predniSONE 50 MG  tablet Commonly known as:  DELTASONE Take 50mg  at 13 hours, 7 hours, and 1 hour before scan   sodium chloride 0.65 % Soln nasal spray Commonly known as:  OCEAN Place 1 spray into both nostrils as needed for congestion.   SOMATULINE DEPOT [place] Inject 120 mcg into the skin every 28 (twenty-eight) days. Receives at Dr Antonieta Pert office   ticagrelor 90 MG Tabs tablet Commonly known as:  BRILINTA Take 1 tablet (90 mg total) by mouth 2 (two) times daily.   tiZANidine 4 MG tablet Commonly known as:  ZANAFLEX Take 1 tablet (4 mg total) by mouth every 6 (six) hours as needed for muscle spasms.   vitamin B-12 1000 MCG tablet Commonly known as:  CYANOCOBALAMIN Take 1,000 mcg by mouth daily.       Allergies:  Allergies  Allergen  Reactions  . Iohexol Hives     Code: HIVES, Desc: PER [name] @ [facility], PT IS ALLERGIC TO CONTRAST DYE 10/02/08/RM  05/01/10...needs full premeds per our protocol w/ [facility]., Onset Date: [id number]     Past Medical History, Surgical history, Social history, and Family History were reviewed and updated.  Review of Systems: Review of Systems  Constitutional: Negative for appetite change, fatigue, fever and unexpected weight change.  HENT:   Negative for lump/mass, mouth sores, sore throat and trouble swallowing.   Respiratory: Negative for cough, hemoptysis and shortness of breath.   Cardiovascular: Negative for leg swelling and palpitations.  Gastrointestinal: Negative for abdominal distention, abdominal pain, blood in stool, constipation, diarrhea, nausea and vomiting.  Genitourinary: Negative for bladder incontinence, dysuria, frequency and hematuria.   Musculoskeletal: Negative for arthralgias, back pain, gait problem and myalgias.  Skin: Negative for itching and rash.  Neurological: Negative for dizziness, extremity weakness, gait problem, headaches, numbness, seizures and speech difficulty.  Hematological: Does not bruise/bleed easily.  Psychiatric/Behavioral: Negative for depression and sleep disturbance. The patient is not nervous/anxious.      Physical Exam:  weight is 209 lb 4 oz (92.2 kg). His oral temperature is 98.3 F (36.8 C). His blood pressure is 113/63 and his pulse is 70. His respiration is 18 and oxygen saturation is 98%.   Wt Readings from Last 3 Encounters:  09/05/17 203 lb 4 oz (92.2 kg)  08/25/17 205 lb (93 kg)  07/25/17 205 lb (93 kg)    Physical Exam Vitals signs reviewed.  HENT:     Head: Normocephalic and atraumatic.  Eyes:     Pupils: Pupils are equal, round, and reactive to light.  Neck:     Musculoskeletal: Normal range of motion.  Cardiovascular:     Rate and Rhythm: Normal rate and regular rhythm.     Heart sounds: Normal heart sounds.   Pulmonary:     Effort: Pulmonary effort is normal.     Breath sounds: Normal breath sounds.  Abdominal:     General: Bowel sounds are normal.     Palpations: Abdomen is soft.  Musculoskeletal: Normal range of motion.        General: No tenderness or deformity.  Lymphadenopathy:     Cervical: No cervical adenopathy.  Skin:    General: Skin is warm and dry.     Findings: No erythema or rash.  Neurological:     Mental Status: He is alert and oriented to person, place, and time.  Psychiatric:        Behavior: Behavior normal.        Thought Content: Thought content normal.  Judgment: Judgment normal.   .   Lab Results  Component Value Date   WBC 6.4 09/05/2017   HGB 12.9 (L) 09/05/2017   HCT 40.1 09/05/2017   MCV 83 09/05/2017   PLT 142 (L) 09/05/2017   No results found for: FERRITIN, IRON, TIBC, UIBC, IRONPCTSAT Lab Results  Component Value Date   RBC 4.81 09/05/2017   No results found for: KPAFRELGTCHN, LAMBDASER, KAPLAMBRATIO No results found for: IGGSERUM, IGA, IGMSERUM No results found for: Odetta Pink, SPEI   Chemistry      Component Value Date/Time   NA 145 09/05/2017 1015   NA 141 07/23/2016 1255   K 4.2 09/05/2017 1015   K 4.1 07/23/2016 1255   CL 105 09/05/2017 1015   CO2 29 09/05/2017 1015   CO2 27 07/23/2016 1255   BUN 16 09/05/2017 1015   BUN 13.8 07/23/2016 1255   CREATININE 1.1 09/05/2017 1015   CREATININE 1.0 07/23/2016 1255      Component Value Date/Time   CALCIUM 9.9 09/05/2017 1015   CALCIUM 9.2 07/23/2016 1255   ALKPHOS 66 09/05/2017 1015   ALKPHOS 77 07/23/2016 1255   AST 38 09/05/2017 1015   AST 23 07/23/2016 1255   ALT 31 09/05/2017 1015   ALT 13 07/23/2016 1255   BILITOT 1.00 09/05/2017 1015   BILITOT 1.13 07/23/2016 1255     Impression and Plan: Mr. Treadway is a 73 year old white male. He has metastatic low-grade neuroendocrine carcinoma. He has hepatic metastases.  I am  very pleased that the dotatate scan did not show any evidence of disease progression.  Right now, we will plan for another monthly follow-up.  Actually, we will try to get him back in January.  We will try to get him through all of the Christmas and New Year holidays.  Hopefully, his wife will be doing a little bit better.  She is having some medical issues.  Burney Gauze, MD  11/5/201811:45 AM

## 2019-10-05 NOTE — Patient Instructions (Signed)
Lanreotide injection What is this medicine? LANREOTIDE (lan REE oh tide) is used to reduce blood levels of growth hormone in patients with a condition called acromegaly. It also works to slow or stop tumor growth in patients with neuroendocrine tumors and treat carcinoid syndrome. This medicine may be used for other purposes; ask your health care provider or pharmacist if you have questions. COMMON BRAND NAME(S): Somatuline [place] What should I tell my health care provider before I take this medicine? They need to know if you have any of these conditions:  diabetes  gallbladder disease  heart disease  kidney disease  liver disease  thyroid disease  an unusual or allergic reaction to lanreotide, other medicines, foods, dyes, or preservatives  pregnant or trying to get pregnant  breast-feeding How should I use this medicine? This medicine is for injection under the skin. It is given by a health care professional in a hospital or clinic setting. Contact your pediatrician or health care professional regarding the use of this medicine in children. Special care may be needed. Overdosage: If you think you have taken too much of this medicine contact a poison control center or emergency room at once. NOTE: This medicine is only for you. Do not share this medicine with others. What if I miss a dose? It is important not to miss your dose. Call your doctor or health care professional if you are unable to keep an appointment. What may interact with this medicine? This medicine may interact with the following medications:  bromocriptine  cyclosporine  certain medicines for blood pressure, heart disease, irregular heart beat  certain medicines for diabetes  quinidine  terfenadine This list may not describe all possible interactions. Give your health care provider a list of all the medicines, herbs, non-prescription drugs, or dietary supplements you use. Also tell them if you smoke,  drink alcohol, or use illegal drugs. Some items may interact with your medicine. What should I watch for while using this medicine? Tell your doctor or healthcare professional if your symptoms do not start to get better or if they get worse. Visit your doctor or health care professional for regular checks on your progress. Your condition will be monitored carefully while you are receiving this medicine. This medicine may increase blood sugar. Ask your healthcare provider if changes in diet or medicines are needed if you have diabetes. You may need blood work done while you are taking this medicine. Women should inform their doctor if they wish to become pregnant or think they might be pregnant. There is a potential for serious side effects to an unborn child. Talk to your health care professional or pharmacist for more information. Do not breast-feed an infant while taking this medicine or for 6 months after stopping it. This medicine has caused ovarian failure in some women. This medicine may interfere with the ability to have a child. Talk with your doctor or health care professional if you are concerned about your fertility. What side effects may I notice from receiving this medicine? Side effects that you should report to your doctor or health care professional as soon as possible:  allergic reactions like skin rash, itching or hives, swelling of the face, lips, or tongue  increased blood pressure  severe stomach pain  signs and symptoms of hgh blood sugar such as being more thirsty or hungry or having to urinate more than normal. You may also feel very tired or have blurry vision.  signs and symptoms of low blood   sugar such as feeling anxious; confusion; dizziness; increased hunger; unusually weak or tired; sweating; shakiness; cold; irritable; headache; blurred vision; fast heartbeat; loss of consciousness  unusually slow heartbeat Side effects that usually do not require medical  attention (report to your doctor or health care professional if they continue or are bothersome):  constipation  diarrhea  dizziness  headache  muscle pain  muscle spasms  nausea  pain, redness, or irritation at site where injected This list may not describe all possible side effects. Call your doctor for medical advice about side effects. You may report side effects to FDA at 1-800-FDA-1088. Where should I keep my medicine? This drug is given in a hospital or clinic and will not be stored at home. NOTE: This sheet is a summary. It may not cover all possible information. If you have questions about this medicine, talk to your doctor, pharmacist, or health care provider.  2020 Elsevier/Gold Standard (2018-07-27 09:13:08)  

## 2019-10-09 ENCOUNTER — Telehealth: Payer: Self-pay | Admitting: Hematology & Oncology

## 2019-10-09 NOTE — Telephone Encounter (Signed)
Appointments scheduled calendar mailed per 12/4 los °

## 2019-10-19 ENCOUNTER — Other Ambulatory Visit: Payer: Self-pay | Admitting: Cardiovascular Disease

## 2019-11-06 DIAGNOSIS — R3121 Asymptomatic microscopic hematuria: Secondary | ICD-10-CM | POA: Diagnosis not present

## 2019-11-07 ENCOUNTER — Encounter: Payer: Self-pay | Admitting: Hematology & Oncology

## 2019-11-07 ENCOUNTER — Telehealth: Payer: Self-pay | Admitting: Hematology & Oncology

## 2019-11-07 ENCOUNTER — Inpatient Hospital Stay (HOSPITAL_BASED_OUTPATIENT_CLINIC_OR_DEPARTMENT_OTHER): Payer: Medicare HMO | Admitting: Hematology & Oncology

## 2019-11-07 ENCOUNTER — Other Ambulatory Visit: Payer: Self-pay

## 2019-11-07 ENCOUNTER — Inpatient Hospital Stay: Payer: Medicare HMO

## 2019-11-07 ENCOUNTER — Inpatient Hospital Stay: Payer: Medicare HMO | Attending: Hematology & Oncology

## 2019-11-07 VITALS — BP 131/73 | HR 69 | Temp 96.9°F | Resp 18 | Wt 201.0 lb

## 2019-11-07 DIAGNOSIS — K909 Intestinal malabsorption, unspecified: Secondary | ICD-10-CM

## 2019-11-07 DIAGNOSIS — D509 Iron deficiency anemia, unspecified: Secondary | ICD-10-CM | POA: Insufficient documentation

## 2019-11-07 DIAGNOSIS — C7B8 Other secondary neuroendocrine tumors: Secondary | ICD-10-CM

## 2019-11-07 DIAGNOSIS — D500 Iron deficiency anemia secondary to blood loss (chronic): Secondary | ICD-10-CM

## 2019-11-07 DIAGNOSIS — C7A8 Other malignant neuroendocrine tumors: Secondary | ICD-10-CM | POA: Diagnosis not present

## 2019-11-07 LAB — CMP (CANCER CENTER ONLY)
ALT: 13 U/L (ref 0–44)
AST: 19 U/L (ref 15–41)
Albumin: 4 g/dL (ref 3.5–5.0)
Alkaline Phosphatase: 50 U/L (ref 38–126)
Anion gap: 7 (ref 5–15)
BUN: 19 mg/dL (ref 8–23)
CO2: 30 mmol/L (ref 22–32)
Calcium: 9.4 mg/dL (ref 8.9–10.3)
Chloride: 102 mmol/L (ref 98–111)
Creatinine: 0.98 mg/dL (ref 0.61–1.24)
GFR, Est AFR Am: >60 mL/min (ref >60)
GFR, Estimated: >60 mL/min (ref >60)
Glucose, Bld: 137 mg/dL — ABNORMAL HIGH (ref 70–99)
Potassium: 4 mmol/L (ref 3.5–5.1)
Sodium: 139 mmol/L (ref 135–145)
Total Bilirubin: 0.6 mg/dL (ref 0.3–1.2)
Total Protein: 7.3 g/dL (ref 6.5–8.1)

## 2019-11-07 LAB — CBC WITH DIFFERENTIAL (CANCER CENTER ONLY)
Abs Immature Granulocytes: 0.01 10*3/uL (ref 0.00–0.07)
Basophils Absolute: 0.1 10*3/uL (ref 0.0–0.1)
Basophils Relative: 1 %
Eosinophils Absolute: 0.3 10*3/uL (ref 0.0–0.5)
Eosinophils Relative: 4 %
HCT: 39.3 % (ref 39.0–52.0)
Hemoglobin: 12.7 g/dL — ABNORMAL LOW (ref 13.0–17.0)
Immature Granulocytes: 0 %
Lymphocytes Relative: 17 %
Lymphs Abs: 1.1 10*3/uL (ref 0.7–4.0)
MCH: 30.2 pg (ref 26.0–34.0)
MCHC: 32.3 g/dL (ref 30.0–36.0)
MCV: 93.6 fL (ref 80.0–100.0)
Monocytes Absolute: 0.4 10*3/uL (ref 0.1–1.0)
Monocytes Relative: 6 %
Neutro Abs: 4.5 10*3/uL (ref 1.7–7.7)
Neutrophils Relative %: 72 %
Platelet Count: 167 10*3/uL (ref 150–400)
RBC: 4.2 MIL/uL — ABNORMAL LOW (ref 4.22–5.81)
RDW: 13.4 % (ref 11.5–15.5)
WBC Count: 6.3 10*3/uL (ref 4.0–10.5)
nRBC: 0 % (ref 0.0–0.2)

## 2019-11-07 MED ORDER — LANREOTIDE ACETATE 120 MG/0.5ML ~~LOC~~ SOLN
120.0000 mg | Freq: Once | SUBCUTANEOUS | Status: AC
  Administered 2019-11-07: 120 mg via SUBCUTANEOUS

## 2019-11-07 MED ORDER — LANREOTIDE ACETATE 120 MG/0.5ML ~~LOC~~ SOLN
SUBCUTANEOUS | Status: AC
  Filled 2019-11-07: qty 120

## 2019-11-07 NOTE — Telephone Encounter (Signed)
Appointments scheduled and patient will get updates from My Chart per 1/6 los

## 2019-11-07 NOTE — Progress Notes (Signed)
Hematology and Oncology Follow Up Visit  Brian Mays [id number] 03/28/1946 74 y.o. 09/05/2017    Principle Diagnosis:  Metastatic low grade neuroendocrine tumor-hepatic metastases Iron def anemia  Current Therapy:   S/p yttrium-90 intrahepatic therapy - November 2016 Somatuline 120 mg monthly Lutathera (Lu 177) injection on 02/01/2018 - s/p cycle #4 IV Iron w/ Injectafer -- dose given on 04/29/2019    Interim History: Brian Mays is back for follow-up.  He actually is doing pretty well.   He really has had no complaints since we last saw him.  He had a very nice Christmas and new year holiday.  He actually is being into woodworking.  He made his daughter a bench.  I am very impressed with this.  His last chromogranin A level back in October was 153.  A new study came out that show that if there is progression of neuroendocrine carcinomas on monthly Somatuline, then we can try every 2-week Somatuline.  He has had no diarrhea.  He has had no chest pain.  There is been no shortness of breath.  He has had no nausea or vomiting.  He is still working, but doing it part-time.  Currently, his performance status is ECOG 1.  Medications:  Allergies as of 09/05/2017      Reactions   Iohexol Hives    Code: HIVES, Desc: PER [name] @ [facility], PT IS ALLERGIC TO CONTRAST DYE 10/02/08/RM  05/01/10...needs full premeds per our protocol w/ [facility]., Onset Date: [id number]      Medication List        Accurate as of 09/05/17 11:45 AM. Always use your most recent med list.          amLODipine 5 MG tablet Commonly known as:  NORVASC Take 5 mg by mouth daily.   aspirin EC 81 MG tablet Take 81 mg by mouth every morning.   atorvastatin 20 MG tablet Commonly known as:  LIPITOR Take 20 mg daily by mouth.   Cholecalciferol 1000 units tablet Take 1,000 Units by mouth daily.   diphenhydrAMINE 50 MG tablet Commonly known as:  BENADRYL Take 1 tablet (50 mg total) by mouth once. Take 1  hour prior to scan.   Fish Oil 1000 MG Caps Take 1 capsule by mouth daily. Reported on 12/30/2015   fluticasone 50 MCG/ACT nasal spray Commonly known as:  FLONASE Place 2 sprays into both nostrils daily as needed (sinuses).   hydrocortisone 25 MG suppository Commonly known as:  ANUSOL-HC INSERT 1 SUPPOSITORY RECTALLY TWICE A DAY AS NEEDED FOR HEMORRHOIDS   lansoprazole 30 MG capsule Commonly known as:  PREVACID Take 1 capsule (30 mg total) by mouth daily at 12 noon.   levothyroxine 50 MCG tablet Commonly known as:  SYNTHROID, LEVOTHROID Take 1 tablet (50 mcg total) by mouth daily.   losartan 100 MG tablet Commonly known as:  COZAAR TAKE 1 TABLET (100 MG TOTAL) BY MOUTH AT BEDTIME.   metoprolol tartrate 25 MG tablet Commonly known as:  LOPRESSOR Take 1 tablet (25 mg total) by mouth 2 (two) times daily.   naproxen 500 MG tablet Commonly known as:  NAPROSYN Take 1 tablet (500 mg total) by mouth 2 (two) times daily with a meal.   nitroGLYCERIN 0.4 MG SL tablet Commonly known as:  NITROSTAT Place 1 tablet (0.4 mg total) under the tongue every 5 (five) minutes x 3 doses as needed for chest pain.   predniSONE 50 MG tablet Commonly known as:  DELTASONE Take  50mg  at 13 hours, 7 hours, and 1 hour before scan   sodium chloride 0.65 % Soln nasal spray Commonly known as:  OCEAN Place 1 spray into both nostrils as needed for congestion.   SOMATULINE DEPOT [place] Inject 120 mcg into the skin every 28 (twenty-eight) days. Receives at Dr Brian Mays office   ticagrelor 90 MG Tabs tablet Commonly known as:  BRILINTA Take 1 tablet (90 mg total) by mouth 2 (two) times daily.   tiZANidine 4 MG tablet Commonly known as:  ZANAFLEX Take 1 tablet (4 mg total) by mouth every 6 (six) hours as needed for muscle spasms.   vitamin B-12 1000 MCG tablet Commonly known as:  CYANOCOBALAMIN Take 1,000 mcg by mouth daily.       Allergies:  Allergies  Allergen Reactions  . Iohexol Hives      Code: HIVES, Desc: PER [name] @ [facility]IMARY [facility], PT IS ALLERGIC TO CONTRAST DYE 10/02/08/RM  05/01/10...needs full premeds per our protocol w/ [facility]., Onset Date: [id number]     Past Medical History, Surgical history, Social history, and Family History were reviewed and updated.  Review of Systems: Review of Systems  Constitutional: Negative for appetite change, fatigue, fever and unexpected weight change.  HENT:   Negative for lump/mass, mouth sores, sore throat and trouble swallowing.   Respiratory: Negative for cough, hemoptysis and shortness of breath.   Cardiovascular: Negative for leg swelling and palpitations.  Gastrointestinal: Negative for abdominal distention, abdominal pain, blood in stool, constipation, diarrhea, nausea and vomiting.  Genitourinary: Negative for bladder incontinence, dysuria, frequency and hematuria.   Musculoskeletal: Negative for arthralgias, back pain, gait problem and myalgias.  Skin: Negative for itching and rash.  Neurological: Negative for dizziness, extremity weakness, gait problem, headaches, numbness, seizures and speech difficulty.  Hematological: Does not bruise/bleed easily.  Psychiatric/Behavioral: Negative for depression and sleep disturbance. The patient is not nervous/anxious.      Physical Exam:  weight is 209 lb 4 oz (92.2 kg). His oral temperature is 98.3 F (36.8 C). His blood pressure is 113/63 and his pulse is 70. His respiration is 18 and oxygen saturation is 98%.   Wt Readings from Last 3 Encounters:  09/05/17 203 lb 4 oz (92.2 kg)  08/25/17 205 lb (93 kg)  07/25/17 205 lb (93 kg)    Physical Exam Vitals reviewed.  HENT:     Head: Normocephalic and atraumatic.  Eyes:     Pupils: Pupils are equal, round, and reactive to light.  Cardiovascular:     Rate and Rhythm: Normal rate and regular rhythm.     Heart sounds: Normal heart sounds.  Pulmonary:     Effort: Pulmonary effort is normal.     Breath sounds: Normal breath sounds.    Abdominal:     General: Bowel sounds are normal.     Palpations: Abdomen is soft.  Musculoskeletal:        General: No tenderness or deformity. Normal range of motion.     Cervical back: Normal range of motion.  Lymphadenopathy:     Cervical: No cervical adenopathy.  Skin:    General: Skin is warm and dry.     Findings: No erythema or rash.  Neurological:     Mental Status: He is alert and oriented to person, place, and time.  Psychiatric:        Behavior: Behavior normal.        Thought Content: Thought content normal.        Judgment: Judgment  normal.   .   Lab Results  Component Value Date   WBC 6.4 09/05/2017   HGB 12.9 (L) 09/05/2017   HCT 40.1 09/05/2017   MCV 83 09/05/2017   PLT 142 (L) 09/05/2017   No results found for: FERRITIN, IRON, TIBC, UIBC, IRONPCTSAT Lab Results  Component Value Date   RBC 4.81 09/05/2017   No results found for: KPAFRELGTCHN, LAMBDASER, KAPLAMBRATIO No results found for: IGGSERUM, IGA, IGMSERUM No results found for: Odetta Pink, SPEI   Chemistry      Component Value Date/Time   NA 145 09/05/2017 1015   NA 141 07/23/2016 1255   K 4.2 09/05/2017 1015   K 4.1 07/23/2016 1255   CL 105 09/05/2017 1015   CO2 29 09/05/2017 1015   CO2 27 07/23/2016 1255   BUN 16 09/05/2017 1015   BUN 13.8 07/23/2016 1255   CREATININE 1.1 09/05/2017 1015   CREATININE 1.0 07/23/2016 1255      Component Value Date/Time   CALCIUM 9.9 09/05/2017 1015   CALCIUM 9.2 07/23/2016 1255   ALKPHOS 66 09/05/2017 1015   ALKPHOS 77 07/23/2016 1255   AST 38 09/05/2017 1015   AST 23 07/23/2016 1255   ALT 31 09/05/2017 1015   ALT 13 07/23/2016 1255   BILITOT 1.00 09/05/2017 1015   BILITOT 1.13 07/23/2016 1255     Impression and Plan: Mr. Buffkin is a 74 year old white male. He has metastatic low-grade neuroendocrine carcinoma. He has hepatic metastases.  I am very pleased that the dotatate scan did not show  any evidence of disease progression.  We will get him back on the 4-week schedule right now.  We had him off a few weeks because of the Christmas holiday.  I am glad we were able to do that for him.  Again, if we find that the chromogranin A level is going up, we could increase the frequency of the Somatuline to every 2 weeks.  Burney Gauze, MD  11/5/201811:45 AM

## 2019-11-07 NOTE — Patient Instructions (Signed)
Lanreotide injection What is this medicine? LANREOTIDE (lan REE oh tide) is used to reduce blood levels of growth hormone in patients with a condition called acromegaly. It also works to slow or stop tumor growth in patients with neuroendocrine tumors and treat carcinoid syndrome. This medicine may be used for other purposes; ask your health care provider or pharmacist if you have questions. COMMON BRAND NAME(S): Somatuline [place] What should I tell my health care provider before I take this medicine? They need to know if you have any of these conditions:  diabetes  gallbladder disease  heart disease  kidney disease  liver disease  thyroid disease  an unusual or allergic reaction to lanreotide, other medicines, foods, dyes, or preservatives  pregnant or trying to get pregnant  breast-feeding How should I use this medicine? This medicine is for injection under the skin. It is given by a health care professional in a hospital or clinic setting. Contact your pediatrician or health care professional regarding the use of this medicine in children. Special care may be needed. Overdosage: If you think you have taken too much of this medicine contact a poison control center or emergency room at once. NOTE: This medicine is only for you. Do not share this medicine with others. What if I miss a dose? It is important not to miss your dose. Call your doctor or health care professional if you are unable to keep an appointment. What may interact with this medicine? This medicine may interact with the following medications:  bromocriptine  cyclosporine  certain medicines for blood pressure, heart disease, irregular heart beat  certain medicines for diabetes  quinidine  terfenadine This list may not describe all possible interactions. Give your health care provider a list of all the medicines, herbs, non-prescription drugs, or dietary supplements you use. Also tell them if you smoke,  drink alcohol, or use illegal drugs. Some items may interact with your medicine. What should I watch for while using this medicine? Tell your doctor or healthcare professional if your symptoms do not start to get better or if they get worse. Visit your doctor or health care professional for regular checks on your progress. Your condition will be monitored carefully while you are receiving this medicine. This medicine may increase blood sugar. Ask your healthcare provider if changes in diet or medicines are needed if you have diabetes. You may need blood work done while you are taking this medicine. Women should inform their doctor if they wish to become pregnant or think they might be pregnant. There is a potential for serious side effects to an unborn child. Talk to your health care professional or pharmacist for more information. Do not breast-feed an infant while taking this medicine or for 6 months after stopping it. This medicine has caused ovarian failure in some women. This medicine may interfere with the ability to have a child. Talk with your doctor or health care professional if you are concerned about your fertility. What side effects may I notice from receiving this medicine? Side effects that you should report to your doctor or health care professional as soon as possible:  allergic reactions like skin rash, itching or hives, swelling of the face, lips, or tongue  increased blood pressure  severe stomach pain  signs and symptoms of hgh blood sugar such as being more thirsty or hungry or having to urinate more than normal. You may also feel very tired or have blurry vision.  signs and symptoms of low blood   sugar such as feeling anxious; confusion; dizziness; increased hunger; unusually weak or tired; sweating; shakiness; cold; irritable; headache; blurred vision; fast heartbeat; loss of consciousness  unusually slow heartbeat Side effects that usually do not require medical  attention (report to your doctor or health care professional if they continue or are bothersome):  constipation  diarrhea  dizziness  headache  muscle pain  muscle spasms  nausea  pain, redness, or irritation at site where injected This list may not describe all possible side effects. Call your doctor for medical advice about side effects. You may report side effects to FDA at 1-800-FDA-1088. Where should I keep my medicine? This drug is given in a hospital or clinic and will not be stored at home. NOTE: This sheet is a summary. It may not cover all possible information. If you have questions about this medicine, talk to your doctor, pharmacist, or health care provider.  2020 Elsevier/Gold Standard (2018-07-27 09:13:08)  

## 2019-11-09 LAB — CHROMOGRANIN A: Chromogranin A (ng/mL): 139.3 ng/mL — ABNORMAL HIGH (ref 0.0–101.8)

## 2019-11-19 ENCOUNTER — Encounter: Payer: Self-pay | Admitting: Family Medicine

## 2019-11-19 ENCOUNTER — Encounter: Payer: Self-pay | Admitting: Hematology & Oncology

## 2019-11-30 ENCOUNTER — Other Ambulatory Visit: Payer: Self-pay | Admitting: Cardiovascular Disease

## 2019-11-30 DIAGNOSIS — E7800 Pure hypercholesterolemia, unspecified: Secondary | ICD-10-CM

## 2019-12-06 ENCOUNTER — Inpatient Hospital Stay: Payer: Medicare HMO | Attending: Hematology & Oncology

## 2019-12-06 ENCOUNTER — Inpatient Hospital Stay (HOSPITAL_BASED_OUTPATIENT_CLINIC_OR_DEPARTMENT_OTHER): Payer: Medicare HMO | Admitting: Hematology & Oncology

## 2019-12-06 ENCOUNTER — Inpatient Hospital Stay: Payer: Medicare HMO

## 2019-12-06 ENCOUNTER — Encounter: Payer: Self-pay | Admitting: Hematology & Oncology

## 2019-12-06 ENCOUNTER — Telehealth: Payer: Self-pay | Admitting: Hematology & Oncology

## 2019-12-06 ENCOUNTER — Other Ambulatory Visit: Payer: Self-pay

## 2019-12-06 VITALS — BP 136/71 | HR 69 | Temp 96.2°F | Resp 18 | Wt 209.0 lb

## 2019-12-06 DIAGNOSIS — C7B8 Other secondary neuroendocrine tumors: Secondary | ICD-10-CM | POA: Diagnosis not present

## 2019-12-06 DIAGNOSIS — D509 Iron deficiency anemia, unspecified: Secondary | ICD-10-CM | POA: Insufficient documentation

## 2019-12-06 DIAGNOSIS — C7A8 Other malignant neuroendocrine tumors: Secondary | ICD-10-CM | POA: Insufficient documentation

## 2019-12-06 DIAGNOSIS — D500 Iron deficiency anemia secondary to blood loss (chronic): Secondary | ICD-10-CM

## 2019-12-06 DIAGNOSIS — K909 Intestinal malabsorption, unspecified: Secondary | ICD-10-CM

## 2019-12-06 LAB — CMP (CANCER CENTER ONLY)
ALT: 15 U/L (ref 0–44)
AST: 22 U/L (ref 15–41)
Albumin: 3.9 g/dL (ref 3.5–5.0)
Alkaline Phosphatase: 51 U/L (ref 38–126)
Anion gap: 7 (ref 5–15)
BUN: 15 mg/dL (ref 8–23)
CO2: 31 mmol/L (ref 22–32)
Calcium: 9.6 mg/dL (ref 8.9–10.3)
Chloride: 102 mmol/L (ref 98–111)
Creatinine: 1.06 mg/dL (ref 0.61–1.24)
GFR, Est AFR Am: >60 mL/min (ref >60)
GFR, Estimated: >60 mL/min (ref >60)
Glucose, Bld: 158 mg/dL — ABNORMAL HIGH (ref 70–99)
Potassium: 4 mmol/L (ref 3.5–5.1)
Sodium: 140 mmol/L (ref 135–145)
Total Bilirubin: 0.7 mg/dL (ref 0.3–1.2)
Total Protein: 7.3 g/dL (ref 6.5–8.1)

## 2019-12-06 LAB — CBC WITH DIFFERENTIAL (CANCER CENTER ONLY)
Abs Immature Granulocytes: 0.02 10*3/uL (ref 0.00–0.07)
Basophils Absolute: 0 10*3/uL (ref 0.0–0.1)
Basophils Relative: 1 %
Eosinophils Absolute: 0.2 10*3/uL (ref 0.0–0.5)
Eosinophils Relative: 4 %
HCT: 39.8 % (ref 39.0–52.0)
Hemoglobin: 12.7 g/dL — ABNORMAL LOW (ref 13.0–17.0)
Immature Granulocytes: 0 %
Lymphocytes Relative: 16 %
Lymphs Abs: 0.9 10*3/uL (ref 0.7–4.0)
MCH: 29.9 pg (ref 26.0–34.0)
MCHC: 31.9 g/dL (ref 30.0–36.0)
MCV: 93.6 fL (ref 80.0–100.0)
Monocytes Absolute: 0.2 10*3/uL (ref 0.1–1.0)
Monocytes Relative: 3 %
Neutro Abs: 4.4 10*3/uL (ref 1.7–7.7)
Neutrophils Relative %: 76 %
Platelet Count: 153 10*3/uL (ref 150–400)
RBC: 4.25 MIL/uL (ref 4.22–5.81)
RDW: 13.5 % (ref 11.5–15.5)
WBC Count: 5.8 10*3/uL (ref 4.0–10.5)
nRBC: 0 % (ref 0.0–0.2)

## 2019-12-06 LAB — IRON AND TIBC
Iron: 57 ug/dL (ref 42–163)
Saturation Ratios: 19 % — ABNORMAL LOW (ref 20–55)
TIBC: 301 ug/dL (ref 202–409)
UIBC: 244 ug/dL (ref 117–376)

## 2019-12-06 LAB — LACTATE DEHYDROGENASE: LDH: 188 U/L (ref 98–192)

## 2019-12-06 LAB — FERRITIN: Ferritin: 88 ng/mL (ref 24–336)

## 2019-12-06 MED ORDER — LANREOTIDE ACETATE 120 MG/0.5ML ~~LOC~~ SOLN
SUBCUTANEOUS | Status: AC
  Filled 2019-12-06: qty 120

## 2019-12-06 MED ORDER — LANREOTIDE ACETATE 120 MG/0.5ML ~~LOC~~ SOLN
120.0000 mg | Freq: Once | SUBCUTANEOUS | Status: AC
  Administered 2019-12-06: 120 mg via SUBCUTANEOUS

## 2019-12-06 NOTE — Telephone Encounter (Signed)
Appointments scheduled patient notified per 2/4 los

## 2019-12-06 NOTE — Patient Instructions (Signed)
Lanreotide injection What is this medicine? LANREOTIDE (lan REE oh tide) is used to reduce blood levels of growth hormone in patients with a condition called acromegaly. It also works to slow or stop tumor growth in patients with neuroendocrine tumors and treat carcinoid syndrome. This medicine may be used for other purposes; ask your health care provider or pharmacist if you have questions. COMMON BRAND NAME(S): Somatuline [place] What should I tell my health care provider before I take this medicine? They need to know if you have any of these conditions:  diabetes  gallbladder disease  heart disease  kidney disease  liver disease  thyroid disease  an unusual or allergic reaction to lanreotide, other medicines, foods, dyes, or preservatives  pregnant or trying to get pregnant  breast-feeding How should I use this medicine? This medicine is for injection under the skin. It is given by a health care professional in a hospital or clinic setting. Contact your pediatrician or health care professional regarding the use of this medicine in children. Special care may be needed. Overdosage: If you think you have taken too much of this medicine contact a poison control center or emergency room at once. NOTE: This medicine is only for you. Do not share this medicine with others. What if I miss a dose? It is important not to miss your dose. Call your doctor or health care professional if you are unable to keep an appointment. What may interact with this medicine? This medicine may interact with the following medications:  bromocriptine  cyclosporine  certain medicines for blood pressure, heart disease, irregular heart beat  certain medicines for diabetes  quinidine  terfenadine This list may not describe all possible interactions. Give your health care provider a list of all the medicines, herbs, non-prescription drugs, or dietary supplements you use. Also tell them if you smoke,  drink alcohol, or use illegal drugs. Some items may interact with your medicine. What should I watch for while using this medicine? Tell your doctor or healthcare professional if your symptoms do not start to get better or if they get worse. Visit your doctor or health care professional for regular checks on your progress. Your condition will be monitored carefully while you are receiving this medicine. This medicine may increase blood sugar. Ask your healthcare provider if changes in diet or medicines are needed if you have diabetes. You may need blood work done while you are taking this medicine. Women should inform their doctor if they wish to become pregnant or think they might be pregnant. There is a potential for serious side effects to an unborn child. Talk to your health care professional or pharmacist for more information. Do not breast-feed an infant while taking this medicine or for 6 months after stopping it. This medicine has caused ovarian failure in some women. This medicine may interfere with the ability to have a child. Talk with your doctor or health care professional if you are concerned about your fertility. What side effects may I notice from receiving this medicine? Side effects that you should report to your doctor or health care professional as soon as possible:  allergic reactions like skin rash, itching or hives, swelling of the face, lips, or tongue  increased blood pressure  severe stomach pain  signs and symptoms of hgh blood sugar such as being more thirsty or hungry or having to urinate more than normal. You may also feel very tired or have blurry vision.  signs and symptoms of low blood   sugar such as feeling anxious; confusion; dizziness; increased hunger; unusually weak or tired; sweating; shakiness; cold; irritable; headache; blurred vision; fast heartbeat; loss of consciousness  unusually slow heartbeat Side effects that usually do not require medical  attention (report to your doctor or health care professional if they continue or are bothersome):  constipation  diarrhea  dizziness  headache  muscle pain  muscle spasms  nausea  pain, redness, or irritation at site where injected This list may not describe all possible side effects. Call your doctor for medical advice about side effects. You may report side effects to FDA at 1-800-FDA-1088. Where should I keep my medicine? This drug is given in a hospital or clinic and will not be stored at home. NOTE: This sheet is a summary. It may not cover all possible information. If you have questions about this medicine, talk to your doctor, pharmacist, or health care provider.  2020 Elsevier/Gold Standard (2018-07-27 09:13:08)  

## 2019-12-06 NOTE — Progress Notes (Signed)
Hematology and Oncology Follow Up Visit  Brian Mays [id number] Mar 16, 1946 74 y.o. 09/05/2017    Principle Diagnosis:  Metastatic low grade neuroendocrine tumor-hepatic metastases Iron def anemia  Current Therapy:   S/p yttrium-90 intrahepatic therapy - November 2016 Somatuline 120 mg monthly Lutathera (Lu 177) injection on 02/01/2018 - s/p cycle #4 IV Iron w/ Injectafer -- dose given on 04/29/2019    Interim History: Brian Mays is back for follow-up.  So far, things seem to be going pretty well with him.  He is being very cautious with the coronavirus.  He has had no problems with abdominal pain.  He has had no problems with cough or shortness of breath.  There is been no change in bowel or bladder habits.  Has had no wheezing.  He has had no flushing.  His last chromogranin A level was 140.  His last Lutathera scan was back in November.  I think everything looked pretty stable on the scan.  There is been no fever.  He has had no headache.  We have had a watch his iron studies in the past.  Back in September, his ferritin was 149 with iron saturation of 26%.  Currently, his performance status is ECOG 1.  Medications:  Allergies as of 09/05/2017      Reactions   Iohexol Hives    Code: HIVES, Desc: PER [name] @ [facility]IMARY [facility]ARE, PT IS ALLERGIC TO CONTRAST DYE 10/02/08/RM  05/01/10...needs full premeds per our protocol w/ [facility]., Onset Date: [id number]      Medication List        Accurate as of 09/05/17 11:45 AM. Always use your most recent med list.          amLODipine 5 MG tablet Commonly known as:  NORVASC Take 5 mg by mouth daily.   aspirin EC 81 MG tablet Take 81 mg by mouth every morning.   atorvastatin 20 MG tablet Commonly known as:  LIPITOR Take 20 mg daily by mouth.   Cholecalciferol 1000 units tablet Take 1,000 Units by mouth daily.   diphenhydrAMINE 50 MG tablet Commonly known as:  BENADRYL Take 1 tablet (50 mg total) by mouth once. Take 1 hour prior  to scan.   Fish Oil 1000 MG Caps Take 1 capsule by mouth daily. Reported on 12/30/2015   fluticasone 50 MCG/ACT nasal spray Commonly known as:  FLONASE Place 2 sprays into both nostrils daily as needed (sinuses).   hydrocortisone 25 MG suppository Commonly known as:  ANUSOL-HC INSERT 1 SUPPOSITORY RECTALLY TWICE A DAY AS NEEDED FOR HEMORRHOIDS   lansoprazole 30 MG capsule Commonly known as:  PREVACID Take 1 capsule (30 mg total) by mouth daily at 12 noon.   levothyroxine 50 MCG tablet Commonly known as:  SYNTHROID, LEVOTHROID Take 1 tablet (50 mcg total) by mouth daily.   losartan 100 MG tablet Commonly known as:  COZAAR TAKE 1 TABLET (100 MG TOTAL) BY MOUTH AT BEDTIME.   metoprolol tartrate 25 MG tablet Commonly known as:  LOPRESSOR Take 1 tablet (25 mg total) by mouth 2 (two) times daily.   naproxen 500 MG tablet Commonly known as:  NAPROSYN Take 1 tablet (500 mg total) by mouth 2 (two) times daily with a meal.   nitroGLYCERIN 0.4 MG SL tablet Commonly known as:  NITROSTAT Place 1 tablet (0.4 mg total) under the tongue every 5 (five) minutes x 3 doses as needed for chest pain.   predniSONE 50 MG tablet Commonly known as:  DELTASONE  Take 50mg  at 13 hours, 7 hours, and 1 hour before scan   sodium chloride 0.65 % Soln nasal spray Commonly known as:  OCEAN Place 1 spray into both nostrils as needed for congestion.   SOMATULINE DEPOT [place] Inject 120 mcg into the skin every 28 (twenty-eight) days. Receives at Dr Antonieta Pert office   ticagrelor 90 MG Tabs tablet Commonly known as:  BRILINTA Take 1 tablet (90 mg total) by mouth 2 (two) times daily.   tiZANidine 4 MG tablet Commonly known as:  ZANAFLEX Take 1 tablet (4 mg total) by mouth every 6 (six) hours as needed for muscle spasms.   vitamin B-12 1000 MCG tablet Commonly known as:  CYANOCOBALAMIN Take 1,000 mcg by mouth daily.       Allergies:  Allergies  Allergen Reactions  . Iohexol Hives     Code: HIVES,  Desc: PER [name] @ [facility]IMARY [facility]ARE, PT IS ALLERGIC TO CONTRAST DYE 10/02/08/RM  05/01/10...needs full premeds per our protocol w/ [facility]., Onset Date: [id number]     Past Medical History, Surgical history, Social history, and Family History were reviewed and updated.  Review of Systems: Review of Systems  Constitutional: Negative for appetite change, fatigue, fever and unexpected weight change.  HENT:   Negative for lump/mass, mouth sores, sore throat and trouble swallowing.   Respiratory: Negative for cough, hemoptysis and shortness of breath.   Cardiovascular: Negative for leg swelling and palpitations.  Gastrointestinal: Negative for abdominal distention, abdominal pain, blood in stool, constipation, diarrhea, nausea and vomiting.  Genitourinary: Negative for bladder incontinence, dysuria, frequency and hematuria.   Musculoskeletal: Negative for arthralgias, back pain, gait problem and myalgias.  Skin: Negative for itching and rash.  Neurological: Negative for dizziness, extremity weakness, gait problem, headaches, numbness, seizures and speech difficulty.  Hematological: Does not bruise/bleed easily.  Psychiatric/Behavioral: Negative for depression and sleep disturbance. The patient is not nervous/anxious.      Physical Exam:  weight is 209 lb 4 oz (92.2 kg). His oral temperature is 98.3 F (36.8 C). His blood pressure is 113/63 and his pulse is 70. His respiration is 18 and oxygen saturation is 98%.   Wt Readings from Last 3 Encounters:  09/05/17 203 lb 4 oz (92.2 kg)  08/25/17 205 lb (93 kg)  07/25/17 205 lb (93 kg)    Physical Exam Vitals reviewed.  HENT:     Head: Normocephalic and atraumatic.  Eyes:     Pupils: Pupils are equal, round, and reactive to light.  Cardiovascular:     Rate and Rhythm: Normal rate and regular rhythm.     Heart sounds: Normal heart sounds.  Pulmonary:     Effort: Pulmonary effort is normal.     Breath sounds: Normal breath sounds.  Abdominal:       General: Bowel sounds are normal.     Palpations: Abdomen is soft.  Musculoskeletal:        General: No tenderness or deformity. Normal range of motion.     Cervical back: Normal range of motion.  Lymphadenopathy:     Cervical: No cervical adenopathy.  Skin:    General: Skin is warm and dry.     Findings: No erythema or rash.  Neurological:     Mental Status: He is alert and oriented to person, place, and time.  Psychiatric:        Behavior: Behavior normal.        Thought Content: Thought content normal.        Judgment:  Judgment normal.   .   Lab Results  Component Value Date   WBC 6.4 09/05/2017   HGB 12.9 (L) 09/05/2017   HCT 40.1 09/05/2017   MCV 83 09/05/2017   PLT 142 (L) 09/05/2017   No results found for: FERRITIN, IRON, TIBC, UIBC, IRONPCTSAT Lab Results  Component Value Date   RBC 4.81 09/05/2017   No results found for: KPAFRELGTCHN, LAMBDASER, KAPLAMBRATIO No results found for: IGGSERUM, IGA, IGMSERUM No results found for: Odetta Pink, SPEI   Chemistry      Component Value Date/Time   NA 145 09/05/2017 1015   NA 141 07/23/2016 1255   K 4.2 09/05/2017 1015   K 4.1 07/23/2016 1255   CL 105 09/05/2017 1015   CO2 29 09/05/2017 1015   CO2 27 07/23/2016 1255   BUN 16 09/05/2017 1015   BUN 13.8 07/23/2016 1255   CREATININE 1.1 09/05/2017 1015   CREATININE 1.0 07/23/2016 1255      Component Value Date/Time   CALCIUM 9.9 09/05/2017 1015   CALCIUM 9.2 07/23/2016 1255   ALKPHOS 66 09/05/2017 1015   ALKPHOS 77 07/23/2016 1255   AST 38 09/05/2017 1015   AST 23 07/23/2016 1255   ALT 31 09/05/2017 1015   ALT 13 07/23/2016 1255   BILITOT 1.00 09/05/2017 1015   BILITOT 1.13 07/23/2016 1255     Impression and Plan: Brian Mays is a 74 year old white male. He has metastatic low-grade neuroendocrine carcinoma. He has hepatic metastases.  He will get his Somatuline today.  I do not see that we have to make  any changes with the frequency of Somatuline injections.  He will come back in 4 weeks for his next injection.  I do not think we need to do any scans on him probably until springtime.    Burney Gauze, MD  11/5/201811:45 AM

## 2019-12-07 LAB — CHROMOGRANIN A: Chromogranin A (ng/mL): 154.5 ng/mL — ABNORMAL HIGH (ref 0.0–101.8)

## 2019-12-14 ENCOUNTER — Other Ambulatory Visit: Payer: Self-pay

## 2019-12-14 ENCOUNTER — Inpatient Hospital Stay: Payer: Medicare HMO

## 2019-12-14 VITALS — BP 137/73 | HR 65 | Temp 96.8°F | Resp 18

## 2019-12-14 DIAGNOSIS — D500 Iron deficiency anemia secondary to blood loss (chronic): Secondary | ICD-10-CM

## 2019-12-14 DIAGNOSIS — C7A8 Other malignant neuroendocrine tumors: Secondary | ICD-10-CM | POA: Diagnosis not present

## 2019-12-14 DIAGNOSIS — D509 Iron deficiency anemia, unspecified: Secondary | ICD-10-CM | POA: Diagnosis not present

## 2019-12-14 DIAGNOSIS — C7B8 Other secondary neuroendocrine tumors: Secondary | ICD-10-CM | POA: Diagnosis not present

## 2019-12-14 MED ORDER — SODIUM CHLORIDE 0.9 % IV SOLN
750.0000 mg | Freq: Once | INTRAVENOUS | Status: AC
  Administered 2019-12-14: 750 mg via INTRAVENOUS
  Filled 2019-12-14: qty 15

## 2019-12-14 MED ORDER — SODIUM CHLORIDE 0.9 % IV SOLN
Freq: Once | INTRAVENOUS | Status: AC
  Filled 2019-12-14: qty 250

## 2019-12-27 ENCOUNTER — Encounter: Payer: Self-pay | Admitting: Hematology & Oncology

## 2019-12-30 DIAGNOSIS — I10 Essential (primary) hypertension: Secondary | ICD-10-CM | POA: Diagnosis not present

## 2019-12-30 DIAGNOSIS — R609 Edema, unspecified: Secondary | ICD-10-CM | POA: Diagnosis not present

## 2019-12-30 DIAGNOSIS — Z008 Encounter for other general examination: Secondary | ICD-10-CM | POA: Diagnosis not present

## 2019-12-30 DIAGNOSIS — E039 Hypothyroidism, unspecified: Secondary | ICD-10-CM | POA: Diagnosis not present

## 2019-12-30 DIAGNOSIS — C7A098 Malignant carcinoid tumors of other sites: Secondary | ICD-10-CM | POA: Diagnosis not present

## 2019-12-30 DIAGNOSIS — Z7902 Long term (current) use of antithrombotics/antiplatelets: Secondary | ICD-10-CM | POA: Diagnosis not present

## 2019-12-30 DIAGNOSIS — E785 Hyperlipidemia, unspecified: Secondary | ICD-10-CM | POA: Diagnosis not present

## 2019-12-30 DIAGNOSIS — Z7982 Long term (current) use of aspirin: Secondary | ICD-10-CM | POA: Diagnosis not present

## 2019-12-30 DIAGNOSIS — K219 Gastro-esophageal reflux disease without esophagitis: Secondary | ICD-10-CM | POA: Diagnosis not present

## 2019-12-30 DIAGNOSIS — I252 Old myocardial infarction: Secondary | ICD-10-CM | POA: Diagnosis not present

## 2019-12-30 DIAGNOSIS — I25119 Atherosclerotic heart disease of native coronary artery with unspecified angina pectoris: Secondary | ICD-10-CM | POA: Diagnosis not present

## 2020-01-03 ENCOUNTER — Other Ambulatory Visit: Payer: Self-pay

## 2020-01-03 ENCOUNTER — Inpatient Hospital Stay (HOSPITAL_BASED_OUTPATIENT_CLINIC_OR_DEPARTMENT_OTHER): Payer: Medicare HMO | Admitting: Hematology & Oncology

## 2020-01-03 ENCOUNTER — Inpatient Hospital Stay: Payer: Medicare HMO

## 2020-01-03 ENCOUNTER — Encounter: Payer: Self-pay | Admitting: Hematology & Oncology

## 2020-01-03 ENCOUNTER — Inpatient Hospital Stay: Payer: Medicare HMO | Attending: Hematology & Oncology

## 2020-01-03 VITALS — BP 107/58 | HR 73 | Temp 96.9°F | Resp 18 | Wt 208.0 lb

## 2020-01-03 DIAGNOSIS — C7A8 Other malignant neuroendocrine tumors: Secondary | ICD-10-CM | POA: Diagnosis present

## 2020-01-03 DIAGNOSIS — D509 Iron deficiency anemia, unspecified: Secondary | ICD-10-CM | POA: Insufficient documentation

## 2020-01-03 DIAGNOSIS — D500 Iron deficiency anemia secondary to blood loss (chronic): Secondary | ICD-10-CM

## 2020-01-03 DIAGNOSIS — C7B8 Other secondary neuroendocrine tumors: Secondary | ICD-10-CM | POA: Insufficient documentation

## 2020-01-03 LAB — CBC WITH DIFFERENTIAL (CANCER CENTER ONLY)
Abs Immature Granulocytes: 0.02 10*3/uL (ref 0.00–0.07)
Basophils Absolute: 0.1 10*3/uL (ref 0.0–0.1)
Basophils Relative: 1 %
Eosinophils Absolute: 0.2 10*3/uL (ref 0.0–0.5)
Eosinophils Relative: 4 %
HCT: 39.3 % (ref 39.0–52.0)
Hemoglobin: 12.6 g/dL — ABNORMAL LOW (ref 13.0–17.0)
Immature Granulocytes: 0 %
Lymphocytes Relative: 16 %
Lymphs Abs: 1 10*3/uL (ref 0.7–4.0)
MCH: 30.4 pg (ref 26.0–34.0)
MCHC: 32.1 g/dL (ref 30.0–36.0)
MCV: 94.9 fL (ref 80.0–100.0)
Monocytes Absolute: 0.3 10*3/uL (ref 0.1–1.0)
Monocytes Relative: 5 %
Neutro Abs: 4.6 10*3/uL (ref 1.7–7.7)
Neutrophils Relative %: 74 %
Platelet Count: 154 10*3/uL (ref 150–400)
RBC: 4.14 MIL/uL — ABNORMAL LOW (ref 4.22–5.81)
RDW: 14.7 % (ref 11.5–15.5)
WBC Count: 6.2 10*3/uL (ref 4.0–10.5)
nRBC: 0 % (ref 0.0–0.2)

## 2020-01-03 LAB — CMP (CANCER CENTER ONLY)
ALT: 17 U/L (ref 0–44)
AST: 24 U/L (ref 15–41)
Albumin: 4.2 g/dL (ref 3.5–5.0)
Alkaline Phosphatase: 56 U/L (ref 38–126)
Anion gap: 4 — ABNORMAL LOW (ref 5–15)
BUN: 16 mg/dL (ref 8–23)
CO2: 32 mmol/L (ref 22–32)
Calcium: 9.4 mg/dL (ref 8.9–10.3)
Chloride: 102 mmol/L (ref 98–111)
Creatinine: 1.07 mg/dL (ref 0.61–1.24)
GFR, Est AFR Am: >60 mL/min (ref >60)
GFR, Estimated: >60 mL/min (ref >60)
Glucose, Bld: 154 mg/dL — ABNORMAL HIGH (ref 70–99)
Potassium: 5.3 mmol/L — ABNORMAL HIGH (ref 3.5–5.1)
Sodium: 138 mmol/L (ref 135–145)
Total Bilirubin: 0.7 mg/dL (ref 0.3–1.2)
Total Protein: 7.7 g/dL (ref 6.5–8.1)

## 2020-01-03 MED ORDER — LANREOTIDE ACETATE 120 MG/0.5ML ~~LOC~~ SOLN
120.0000 mg | Freq: Once | SUBCUTANEOUS | Status: AC
  Administered 2020-01-03: 120 mg via SUBCUTANEOUS

## 2020-01-03 MED ORDER — LANREOTIDE ACETATE 120 MG/0.5ML ~~LOC~~ SOLN
SUBCUTANEOUS | Status: AC
  Filled 2020-01-03: qty 120

## 2020-01-03 NOTE — Progress Notes (Signed)
Hematology and Oncology Follow Up Visit  Brian Mays [id number] 1946-06-29 74 y.o. 09/05/2017    Principle Diagnosis:  Metastatic low grade neuroendocrine tumor-hepatic metastases Iron def anemia  Current Therapy:   S/p yttrium-90 intrahepatic therapy - November 2016 Somatuline 120 mg monthly Lutathera (Lu 177) injection on 02/01/2018 - s/p cycle #4 IV Iron w/ Injectafer -- dose given on 04/29/2019    Interim History: Brian Mays is back for follow-up.  He looks quite good.  He feels okay.  He is trying to lose a little bit of weight.  He has had no problems with diarrhea.  He has had no problems with abdominal pain.  There is no cough or shortness of breath.  His last chromogranin A level was 152.  He has had no issues with leg swelling.  He has had no headache.  He has had no palpitations.  Overall, I would say his performance status is ECOG 1.    Medications:  Allergies as of 09/05/2017      Reactions   Iohexol Hives    Code: HIVES, Desc: PER [name] @ [facility]IMARY [facility]ARE, PT IS ALLERGIC TO CONTRAST DYE 10/02/08/RM  05/01/10...needs full premeds per our protocol w/ [facility]., Onset Date: [id number]      Medication List        Accurate as of 09/05/17 11:45 AM. Always use your most recent med list.          amLODipine 5 MG tablet Commonly known as:  NORVASC Take 5 mg by mouth daily.   aspirin EC 81 MG tablet Take 81 mg by mouth every morning.   atorvastatin 20 MG tablet Commonly known as:  LIPITOR Take 20 mg daily by mouth.   Cholecalciferol 1000 units tablet Take 1,000 Units by mouth daily.   diphenhydrAMINE 50 MG tablet Commonly known as:  BENADRYL Take 1 tablet (50 mg total) by mouth once. Take 1 hour prior to scan.   Fish Oil 1000 MG Caps Take 1 capsule by mouth daily. Reported on 12/30/2015   fluticasone 50 MCG/ACT nasal spray Commonly known as:  FLONASE Place 2 sprays into both nostrils daily as needed (sinuses).   hydrocortisone 25 MG suppository Commonly  known as:  ANUSOL-HC INSERT 1 SUPPOSITORY RECTALLY TWICE A DAY AS NEEDED FOR HEMORRHOIDS   lansoprazole 30 MG capsule Commonly known as:  PREVACID Take 1 capsule (30 mg total) by mouth daily at 12 noon.   levothyroxine 50 MCG tablet Commonly known as:  SYNTHROID, LEVOTHROID Take 1 tablet (50 mcg total) by mouth daily.   losartan 100 MG tablet Commonly known as:  COZAAR TAKE 1 TABLET (100 MG TOTAL) BY MOUTH AT BEDTIME.   metoprolol tartrate 25 MG tablet Commonly known as:  LOPRESSOR Take 1 tablet (25 mg total) by mouth 2 (two) times daily.   naproxen 500 MG tablet Commonly known as:  NAPROSYN Take 1 tablet (500 mg total) by mouth 2 (two) times daily with a meal.   nitroGLYCERIN 0.4 MG SL tablet Commonly known as:  NITROSTAT Place 1 tablet (0.4 mg total) under the tongue every 5 (five) minutes x 3 doses as needed for chest pain.   predniSONE 50 MG tablet Commonly known as:  DELTASONE Take 50mg  at 13 hours, 7 hours, and 1 hour before scan   sodium chloride 0.65 % Soln nasal spray Commonly known as:  OCEAN Place 1 spray into both nostrils as needed for congestion.   SOMATULINE DEPOT [place] Inject 120 mcg into the skin every  28 (twenty-eight) days. Receives at Dr Brian Mays office   ticagrelor 90 MG Tabs tablet Commonly known as:  BRILINTA Take 1 tablet (90 mg total) by mouth 2 (two) times daily.   tiZANidine 4 MG tablet Commonly known as:  ZANAFLEX Take 1 tablet (4 mg total) by mouth every 6 (six) hours as needed for muscle spasms.   vitamin B-12 1000 MCG tablet Commonly known as:  CYANOCOBALAMIN Take 1,000 mcg by mouth daily.       Allergies:  Allergies  Allergen Reactions  . Iohexol Hives     Code: HIVES, Desc: PER [name] @ [facility]IMARY [facility]ARE, PT IS ALLERGIC TO CONTRAST DYE 10/02/08/RM  05/01/10...needs full premeds per our protocol w/ [facility]., Onset Date: [id number]     Past Medical History, Surgical history, Social history, and Family History were reviewed and  updated.  Review of Systems: Review of Systems  Constitutional: Negative for appetite change, fatigue, fever and unexpected weight change.  HENT:   Negative for lump/mass, mouth sores, sore throat and trouble swallowing.   Respiratory: Negative for cough, hemoptysis and shortness of breath.   Cardiovascular: Negative for leg swelling and palpitations.  Gastrointestinal: Negative for abdominal distention, abdominal pain, blood in stool, constipation, diarrhea, nausea and vomiting.  Genitourinary: Negative for bladder incontinence, dysuria, frequency and hematuria.   Musculoskeletal: Negative for arthralgias, back pain, gait problem and myalgias.  Skin: Negative for itching and rash.  Neurological: Negative for dizziness, extremity weakness, gait problem, headaches, numbness, seizures and speech difficulty.  Hematological: Does not bruise/bleed easily.  Psychiatric/Behavioral: Negative for depression and sleep disturbance. The patient is not nervous/anxious.      Physical Exam:  weight is 209 lb 4 oz (92.2 kg). His oral temperature is 98.3 F (36.8 C). His blood pressure is 113/63 and his pulse is 70. His respiration is 18 and oxygen saturation is 98%.   Wt Readings from Last 3 Encounters:  09/05/17 203 lb 4 oz (92.2 kg)  08/25/17 205 lb (93 kg)  07/25/17 205 lb (93 kg)    Physical Exam Vitals reviewed.  HENT:     Head: Normocephalic and atraumatic.  Eyes:     Pupils: Pupils are equal, round, and reactive to light.  Cardiovascular:     Rate and Rhythm: Normal rate and regular rhythm.     Heart sounds: Normal heart sounds.  Pulmonary:     Effort: Pulmonary effort is normal.     Breath sounds: Normal breath sounds.  Abdominal:     General: Bowel sounds are normal.     Palpations: Abdomen is soft.  Musculoskeletal:        General: No tenderness or deformity. Normal range of motion.     Cervical back: Normal range of motion.  Lymphadenopathy:     Cervical: No cervical  adenopathy.  Skin:    General: Skin is warm and dry.     Findings: No erythema or rash.  Neurological:     Mental Status: He is alert and oriented to person, place, and time.  Psychiatric:        Behavior: Behavior normal.        Thought Content: Thought content normal.        Judgment: Judgment normal.   .   Lab Results  Component Value Date   WBC 6.4 09/05/2017   HGB 12.9 (L) 09/05/2017   HCT 40.1 09/05/2017   MCV 83 09/05/2017   PLT 142 (L) 09/05/2017   No results found for: FERRITIN, IRON, TIBC,  UIBC, IRONPCTSAT Lab Results  Component Value Date   RBC 4.81 09/05/2017   No results found for: KPAFRELGTCHN, LAMBDASER, KAPLAMBRATIO No results found for: IGGSERUM, IGA, IGMSERUM No results found for: Odetta Pink, SPEI   Chemistry      Component Value Date/Time   NA 145 09/05/2017 1015   NA 141 07/23/2016 1255   K 4.2 09/05/2017 1015   K 4.1 07/23/2016 1255   CL 105 09/05/2017 1015   CO2 29 09/05/2017 1015   CO2 27 07/23/2016 1255   BUN 16 09/05/2017 1015   BUN 13.8 07/23/2016 1255   CREATININE 1.1 09/05/2017 1015   CREATININE 1.0 07/23/2016 1255      Component Value Date/Time   CALCIUM 9.9 09/05/2017 1015   CALCIUM 9.2 07/23/2016 1255   ALKPHOS 66 09/05/2017 1015   ALKPHOS 77 07/23/2016 1255   AST 38 09/05/2017 1015   AST 23 07/23/2016 1255   ALT 31 09/05/2017 1015   ALT 13 07/23/2016 1255   BILITOT 1.00 09/05/2017 1015   BILITOT 1.13 07/23/2016 1255     Impression and Plan: Mr. Orlandini is a 74 year old white male. He has metastatic low-grade neuroendocrine carcinoma. He has hepatic metastases.  He will get his Somatuline today.  I do not see that we have to make any changes with the frequency of Somatuline injections.  He will come back in 4 weeks for his next injection.  I do not think we need to do any scans on him probably until springtime.    Burney Gauze, MD  11/5/201811:45 AM

## 2020-01-04 LAB — CHROMOGRANIN A: Chromogranin A (ng/mL): 175.8 ng/mL — ABNORMAL HIGH (ref 0.0–101.8)

## 2020-01-04 LAB — IRON AND TIBC
Iron: 85 ug/dL (ref 42–163)
Saturation Ratios: 30 % (ref 20–55)
TIBC: 284 ug/dL (ref 202–409)
UIBC: 199 ug/dL (ref 117–376)

## 2020-01-04 LAB — FERRITIN: Ferritin: 520 ng/mL — ABNORMAL HIGH (ref 24–336)

## 2020-01-08 ENCOUNTER — Telehealth: Payer: Self-pay | Admitting: Hematology & Oncology

## 2020-01-08 NOTE — Telephone Encounter (Signed)
Called and advised patient of upcoming appointments added per 3/4 los

## 2020-01-10 ENCOUNTER — Encounter: Payer: Self-pay | Admitting: Family Medicine

## 2020-01-15 DIAGNOSIS — M4302 Spondylolysis, cervical region: Secondary | ICD-10-CM | POA: Diagnosis not present

## 2020-01-15 DIAGNOSIS — R69 Illness, unspecified: Secondary | ICD-10-CM | POA: Diagnosis not present

## 2020-01-15 DIAGNOSIS — G44211 Episodic tension-type headache, intractable: Secondary | ICD-10-CM | POA: Diagnosis not present

## 2020-01-22 DIAGNOSIS — L84 Corns and callosities: Secondary | ICD-10-CM | POA: Diagnosis not present

## 2020-01-22 DIAGNOSIS — L280 Lichen simplex chronicus: Secondary | ICD-10-CM | POA: Diagnosis not present

## 2020-01-22 DIAGNOSIS — L281 Prurigo nodularis: Secondary | ICD-10-CM | POA: Diagnosis not present

## 2020-01-22 DIAGNOSIS — L858 Other specified epidermal thickening: Secondary | ICD-10-CM | POA: Diagnosis not present

## 2020-01-29 ENCOUNTER — Other Ambulatory Visit: Payer: Self-pay | Admitting: Cardiovascular Disease

## 2020-02-01 ENCOUNTER — Inpatient Hospital Stay: Payer: Medicare HMO

## 2020-02-01 ENCOUNTER — Encounter: Payer: Self-pay | Admitting: Hematology & Oncology

## 2020-02-01 ENCOUNTER — Inpatient Hospital Stay: Payer: Medicare HMO | Attending: Hematology & Oncology

## 2020-02-01 ENCOUNTER — Other Ambulatory Visit: Payer: Self-pay

## 2020-02-01 ENCOUNTER — Inpatient Hospital Stay (HOSPITAL_BASED_OUTPATIENT_CLINIC_OR_DEPARTMENT_OTHER): Payer: Medicare HMO | Admitting: Hematology & Oncology

## 2020-02-01 VITALS — BP 133/62 | HR 62 | Temp 97.1°F | Resp 18 | Wt 209.0 lb

## 2020-02-01 DIAGNOSIS — C7A8 Other malignant neuroendocrine tumors: Secondary | ICD-10-CM | POA: Diagnosis present

## 2020-02-01 DIAGNOSIS — C7B8 Other secondary neuroendocrine tumors: Secondary | ICD-10-CM

## 2020-02-01 DIAGNOSIS — D500 Iron deficiency anemia secondary to blood loss (chronic): Secondary | ICD-10-CM

## 2020-02-01 DIAGNOSIS — D509 Iron deficiency anemia, unspecified: Secondary | ICD-10-CM | POA: Diagnosis not present

## 2020-02-01 LAB — IRON AND TIBC
Iron: 77 ug/dL (ref 42–163)
Saturation Ratios: 27 % (ref 20–55)
TIBC: 283 ug/dL (ref 202–409)
UIBC: 205 ug/dL (ref 117–376)

## 2020-02-01 LAB — CMP (CANCER CENTER ONLY)
ALT: 17 U/L (ref 0–44)
AST: 23 U/L (ref 15–41)
Albumin: 4 g/dL (ref 3.5–5.0)
Alkaline Phosphatase: 61 U/L (ref 38–126)
Anion gap: 5 (ref 5–15)
BUN: 17 mg/dL (ref 8–23)
CO2: 34 mmol/L — ABNORMAL HIGH (ref 22–32)
Calcium: 10 mg/dL (ref 8.9–10.3)
Chloride: 104 mmol/L (ref 98–111)
Creatinine: 1.05 mg/dL (ref 0.61–1.24)
GFR, Est AFR Am: >60 mL/min (ref >60)
GFR, Estimated: >60 mL/min (ref >60)
Glucose, Bld: 104 mg/dL — ABNORMAL HIGH (ref 70–99)
Potassium: 4.3 mmol/L (ref 3.5–5.1)
Sodium: 143 mmol/L (ref 135–145)
Total Bilirubin: 0.7 mg/dL (ref 0.3–1.2)
Total Protein: 7.5 g/dL (ref 6.5–8.1)

## 2020-02-01 LAB — CBC WITH DIFFERENTIAL (CANCER CENTER ONLY)
Abs Immature Granulocytes: 0.02 10*3/uL (ref 0.00–0.07)
Basophils Absolute: 0 10*3/uL (ref 0.0–0.1)
Basophils Relative: 1 %
Eosinophils Absolute: 0.3 10*3/uL (ref 0.0–0.5)
Eosinophils Relative: 5 %
HCT: 42.1 % (ref 39.0–52.0)
Hemoglobin: 13.7 g/dL (ref 13.0–17.0)
Immature Granulocytes: 0 %
Lymphocytes Relative: 15 %
Lymphs Abs: 0.9 10*3/uL (ref 0.7–4.0)
MCH: 30.8 pg (ref 26.0–34.0)
MCHC: 32.5 g/dL (ref 30.0–36.0)
MCV: 94.6 fL (ref 80.0–100.0)
Monocytes Absolute: 0.2 10*3/uL (ref 0.1–1.0)
Monocytes Relative: 4 %
Neutro Abs: 4.2 10*3/uL (ref 1.7–7.7)
Neutrophils Relative %: 75 %
Platelet Count: 145 10*3/uL — ABNORMAL LOW (ref 150–400)
RBC: 4.45 MIL/uL (ref 4.22–5.81)
RDW: 14.2 % (ref 11.5–15.5)
WBC Count: 5.7 10*3/uL (ref 4.0–10.5)
nRBC: 0 % (ref 0.0–0.2)

## 2020-02-01 LAB — LACTATE DEHYDROGENASE: LDH: 191 U/L (ref 98–192)

## 2020-02-01 LAB — FERRITIN: Ferritin: 322 ng/mL (ref 24–336)

## 2020-02-01 MED ORDER — LANREOTIDE ACETATE 120 MG/0.5ML ~~LOC~~ SOLN
120.0000 mg | Freq: Once | SUBCUTANEOUS | Status: AC
  Administered 2020-02-01: 120 mg via SUBCUTANEOUS

## 2020-02-01 MED ORDER — LANREOTIDE ACETATE 120 MG/0.5ML ~~LOC~~ SOLN
SUBCUTANEOUS | Status: AC
  Filled 2020-02-01: qty 120

## 2020-02-01 NOTE — Progress Notes (Signed)
Hematology and Oncology Follow Up Visit  Brian Mays [id number] 09/17/1946 74 y.o. 09/05/2017    Principle Diagnosis:  Metastatic low grade neuroendocrine tumor-hepatic metastases Iron def anemia  Current Therapy:   S/p yttrium-90 intrahepatic therapy - November 2016 Somatuline 120 mg monthly Lutathera (Lu 177) injection on 02/01/2018 - s/p cycle #4 IV Iron w/ Injectafer -- dose given on 04/29/2019    Interim History: Brian Mays is back for follow-up.  He looks quite good.  He feels okay.  He is still trying to lose a little bit of weight.  His weight is actually up 6 pounds since we saw him about a month ago.  But problem is that the chromogranin A level keeps going up.  When we saw him back in March, the chromogranin A level was 176.  I think but I have to get another scan on him.  Hopefully, the insurance will allow [place] to do a dotatate scan since we are going to have to think about chemotherapy as we are exhausting our other options.  He has had no problems with diarrhea.  He has had no cough or shortness of breath.  He has had no nausea or vomiting.  His poor wife is not doing too well.  He is doing his best to try to help her with her medical issues.  It sounds like he will have a nice Easter weekend with his family.  Overall, his performance status is ECOG 1.  Medications:  Allergies as of 09/05/2017      Reactions   Iohexol Hives    Code: HIVES, Desc: PER [name] @ [facility]IMARY [facility]ARE, PT IS ALLERGIC TO CONTRAST DYE 10/02/08/RM  05/01/10...needs full premeds per our protocol w/ [facility]., Onset Date: [id number]      Medication List        Accurate as of 09/05/17 11:45 AM. Always use your most recent med list.          amLODipine 5 MG tablet Commonly known as:  NORVASC Take 5 mg by mouth daily.   aspirin EC 81 MG tablet Take 81 mg by mouth every morning.   atorvastatin 20 MG tablet Commonly known as:  LIPITOR Take 20 mg daily by mouth.   Cholecalciferol 1000 units  tablet Take 1,000 Units by mouth daily.   diphenhydrAMINE 50 MG tablet Commonly known as:  BENADRYL Take 1 tablet (50 mg total) by mouth once. Take 1 hour prior to scan.   Fish Oil 1000 MG Caps Take 1 capsule by mouth daily. Reported on 12/30/2015   fluticasone 50 MCG/ACT nasal spray Commonly known as:  FLONASE Place 2 sprays into both nostrils daily as needed (sinuses).   hydrocortisone 25 MG suppository Commonly known as:  ANUSOL-HC INSERT 1 SUPPOSITORY RECTALLY TWICE A DAY AS NEEDED FOR HEMORRHOIDS   lansoprazole 30 MG capsule Commonly known as:  PREVACID Take 1 capsule (30 mg total) by mouth daily at 12 noon.   levothyroxine 50 MCG tablet Commonly known as:  SYNTHROID, LEVOTHROID Take 1 tablet (50 mcg total) by mouth daily.   losartan 100 MG tablet Commonly known as:  COZAAR TAKE 1 TABLET (100 MG TOTAL) BY MOUTH AT BEDTIME.   metoprolol tartrate 25 MG tablet Commonly known as:  LOPRESSOR Take 1 tablet (25 mg total) by mouth 2 (two) times daily.   naproxen 500 MG tablet Commonly known as:  NAPROSYN Take 1 tablet (500 mg total) by mouth 2 (two) times daily with a meal.   nitroGLYCERIN 0.4  MG SL tablet Commonly known as:  NITROSTAT Place 1 tablet (0.4 mg total) under the tongue every 5 (five) minutes x 3 doses as needed for chest pain.   predniSONE 50 MG tablet Commonly known as:  DELTASONE Take 50mg  at 13 hours, 7 hours, and 1 hour before scan   sodium chloride 0.65 % Soln nasal spray Commonly known as:  OCEAN Place 1 spray into both nostrils as needed for congestion.   SOMATULINE DEPOT [place] Inject 120 mcg into the skin every 28 (twenty-eight) days. Receives at Dr Antonieta Pert office   ticagrelor 90 MG Tabs tablet Commonly known as:  BRILINTA Take 1 tablet (90 mg total) by mouth 2 (two) times daily.   tiZANidine 4 MG tablet Commonly known as:  ZANAFLEX Take 1 tablet (4 mg total) by mouth every 6 (six) hours as needed for muscle spasms.   vitamin B-12 1000 MCG  tablet Commonly known as:  CYANOCOBALAMIN Take 1,000 mcg by mouth daily.       Allergies:  Allergies  Allergen Reactions  . Iohexol Hives     Code: HIVES, Desc: PER [name] @ PRIMARY [facility]ARE, PT IS ALLERGIC TO CONTRAST DYE 10/02/08/RM  05/01/10...needs full premeds per our protocol w/ [facility]., Onset Date: [id number]     Past Medical History, Surgical history, Social history, and Family History were reviewed and updated.  Review of Systems: Review of Systems  Constitutional: Negative for appetite change, fatigue, fever and unexpected weight change.  HENT:   Negative for lump/mass, mouth sores, sore throat and trouble swallowing.   Respiratory: Negative for cough, hemoptysis and shortness of breath.   Cardiovascular: Negative for leg swelling and palpitations.  Gastrointestinal: Negative for abdominal distention, abdominal pain, blood in stool, constipation, diarrhea, nausea and vomiting.  Genitourinary: Negative for bladder incontinence, dysuria, frequency and hematuria.   Musculoskeletal: Negative for arthralgias, back pain, gait problem and myalgias.  Skin: Negative for itching and rash.  Neurological: Negative for dizziness, extremity weakness, gait problem, headaches, numbness, seizures and speech difficulty.  Hematological: Does not bruise/bleed easily.  Psychiatric/Behavioral: Negative for depression and sleep disturbance. The patient is not nervous/anxious.      Physical Exam:  weight is 209 lb 4 oz (92.2 kg). His oral temperature is 98.3 F (36.8 C). His blood pressure is 113/63 and his pulse is 70. His respiration is 18 and oxygen saturation is 98%.   Wt Readings from Last 3 Encounters:  09/05/17 203 lb 4 oz (92.2 kg)  08/25/17 205 lb (93 kg)  07/25/17 205 lb (93 kg)    Physical Exam Vitals reviewed.  HENT:     Head: Normocephalic and atraumatic.  Eyes:     Pupils: Pupils are equal, round, and reactive to light.  Cardiovascular:     Rate and Rhythm: Normal rate and  regular rhythm.     Heart sounds: Normal heart sounds.  Pulmonary:     Effort: Pulmonary effort is normal.     Breath sounds: Normal breath sounds.  Abdominal:     General: Bowel sounds are normal.     Palpations: Abdomen is soft.  Musculoskeletal:        General: No tenderness or deformity. Normal range of motion.     Cervical back: Normal range of motion.  Lymphadenopathy:     Cervical: No cervical adenopathy.  Skin:    General: Skin is warm and dry.     Findings: No erythema or rash.  Neurological:     Mental Status: He is alert  and oriented to person, place, and time.  Psychiatric:        Behavior: Behavior normal.        Thought Content: Thought content normal.        Judgment: Judgment normal.   .   Lab Results  Component Value Date   WBC 6.4 09/05/2017   HGB 12.9 (L) 09/05/2017   HCT 40.1 09/05/2017   MCV 83 09/05/2017   PLT 142 (L) 09/05/2017   No results found for: FERRITIN, IRON, TIBC, UIBC, IRONPCTSAT Lab Results  Component Value Date   RBC 4.81 09/05/2017   No results found for: KPAFRELGTCHN, LAMBDASER, KAPLAMBRATIO No results found for: IGGSERUM, IGA, IGMSERUM No results found for: Odetta Pink, SPEI   Chemistry      Component Value Date/Time   NA 145 09/05/2017 1015   NA 141 07/23/2016 1255   K 4.2 09/05/2017 1015   K 4.1 07/23/2016 1255   CL 105 09/05/2017 1015   CO2 29 09/05/2017 1015   CO2 27 07/23/2016 1255   BUN 16 09/05/2017 1015   BUN 13.8 07/23/2016 1255   CREATININE 1.1 09/05/2017 1015   CREATININE 1.0 07/23/2016 1255      Component Value Date/Time   CALCIUM 9.9 09/05/2017 1015   CALCIUM 9.2 07/23/2016 1255   ALKPHOS 66 09/05/2017 1015   ALKPHOS 77 07/23/2016 1255   AST 38 09/05/2017 1015   AST 23 07/23/2016 1255   ALT 31 09/05/2017 1015   ALT 13 07/23/2016 1255   BILITOT 1.00 09/05/2017 1015   BILITOT 1.13 07/23/2016 1255     Impression and Plan: Brian Mays is a 74 year old  white male. He has metastatic low-grade neuroendocrine carcinoma. He has hepatic metastases.  He will get his Somatuline today.  We will have to see about doing scans on him.  Again the chromogranin [place] level is going to be critical.  Again if we do find that he has progressive disease, we might have to go to chemotherapy.  Or, we could possibly increase the frequency of the Somatuline to every 2 weeks.   Burney Gauze, MD  11/5/201811:45 AM

## 2020-02-01 NOTE — Patient Instructions (Signed)
Lanreotide injection What is this medicine? LANREOTIDE (lan REE oh tide) is used to reduce blood levels of growth hormone in patients with a condition called acromegaly. It also works to slow or stop tumor growth in patients with neuroendocrine tumors and treat carcinoid syndrome. This medicine may be used for other purposes; ask your health care provider or pharmacist if you have questions. COMMON BRAND NAME(S): Somatuline [place] What should I tell my health care provider before I take this medicine? They need to know if you have any of these conditions:  diabetes  gallbladder disease  heart disease  kidney disease  liver disease  thyroid disease  an unusual or allergic reaction to lanreotide, other medicines, foods, dyes, or preservatives  pregnant or trying to get pregnant  breast-feeding How should I use this medicine? This medicine is for injection under the skin. It is given by a health care professional in a hospital or clinic setting. Contact your pediatrician or health care professional regarding the use of this medicine in children. Special care may be needed. Overdosage: If you think you have taken too much of this medicine contact a poison control center or emergency room at once. NOTE: This medicine is only for you. Do not share this medicine with others. What if I miss a dose? It is important not to miss your dose. Call your doctor or health care professional if you are unable to keep an appointment. What may interact with this medicine? This medicine may interact with the following medications:  bromocriptine  cyclosporine  certain medicines for blood pressure, heart disease, irregular heart beat  certain medicines for diabetes  quinidine  terfenadine This list may not describe all possible interactions. Give your health care provider a list of all the medicines, herbs, non-prescription drugs, or dietary supplements you use. Also tell them if you smoke,  drink alcohol, or use illegal drugs. Some items may interact with your medicine. What should I watch for while using this medicine? Tell your doctor or healthcare professional if your symptoms do not start to get better or if they get worse. Visit your doctor or health care professional for regular checks on your progress. Your condition will be monitored carefully while you are receiving this medicine. This medicine may increase blood sugar. Ask your healthcare provider if changes in diet or medicines are needed if you have diabetes. You may need blood work done while you are taking this medicine. Women should inform their doctor if they wish to become pregnant or think they might be pregnant. There is a potential for serious side effects to an unborn child. Talk to your health care professional or pharmacist for more information. Do not breast-feed an infant while taking this medicine or for 6 months after stopping it. This medicine has caused ovarian failure in some women. This medicine may interfere with the ability to have a child. Talk with your doctor or health care professional if you are concerned about your fertility. What side effects may I notice from receiving this medicine? Side effects that you should report to your doctor or health care professional as soon as possible:  allergic reactions like skin rash, itching or hives, swelling of the face, lips, or tongue  increased blood pressure  severe stomach pain  signs and symptoms of hgh blood sugar such as being more thirsty or hungry or having to urinate more than normal. You may also feel very tired or have blurry vision.  signs and symptoms of low blood   sugar such as feeling anxious; confusion; dizziness; increased hunger; unusually weak or tired; sweating; shakiness; cold; irritable; headache; blurred vision; fast heartbeat; loss of consciousness  unusually slow heartbeat Side effects that usually do not require medical  attention (report to your doctor or health care professional if they continue or are bothersome):  constipation  diarrhea  dizziness  headache  muscle pain  muscle spasms  nausea  pain, redness, or irritation at site where injected This list may not describe all possible side effects. Call your doctor for medical advice about side effects. You may report side effects to FDA at 1-800-FDA-1088. Where should I keep my medicine? This drug is given in a hospital or clinic and will not be stored at home. NOTE: This sheet is a summary. It may not cover all possible information. If you have questions about this medicine, talk to your doctor, pharmacist, or health care provider.  2020 Elsevier/Gold Standard (2018-07-27 09:13:08)  

## 2020-02-04 LAB — CHROMOGRANIN A: Chromogranin A (ng/mL): 158.3 ng/mL — ABNORMAL HIGH (ref 0.0–101.8)

## 2020-02-25 NOTE — Progress Notes (Signed)
Pharmacist Chemotherapy Monitoring - Follow Up Assessment    I verify that I have reviewed each item in the below checklist:  . Regimen for the patient is scheduled for the appropriate day and plan matches scheduled date. [name] Appropriate non-routine labs are ordered dependent on drug ordered. . If applicable, additional medications reviewed and ordered per protocol based on lifetime cumulative doses and/or treatment regimen.   Plan for follow-up and/or issues identified: Yes . I-vent associated with next due treatment: Yes . MD and/or nursing notified: Yes  Brian Mays, Brian Mays 02/25/2020 7:55 AM

## 2020-02-27 ENCOUNTER — Other Ambulatory Visit: Payer: Self-pay

## 2020-02-27 ENCOUNTER — Ambulatory Visit (HOSPITAL_COMMUNITY)
Admission: RE | Admit: 2020-02-27 | Discharge: 2020-02-27 | Disposition: A | Discharge status: Home / Self Care | Payer: Medicare HMO | Source: Ambulatory Visit | Attending: Hematology & Oncology | Admitting: Hematology & Oncology

## 2020-02-27 DIAGNOSIS — C7B8 Other secondary neuroendocrine tumors: Secondary | ICD-10-CM | POA: Diagnosis not present

## 2020-02-27 DIAGNOSIS — C801 Malignant (primary) neoplasm, unspecified: Secondary | ICD-10-CM | POA: Diagnosis not present

## 2020-02-27 IMAGING — PT NM PET SKULL BASE TO THIGH
1 of 8 series · 1 of 25 positions shown · non-contrast
Comparison: DOTATATE PET scan [DATE], [DATE], CT [DATE]

CLINICAL DATA: Restaging metastatic neuroendocrine tumor. Patient
status post yttrium 90 microspheres tumor ablation as well as the LU
177 Dotatate peptide receptor therapy. Four cycles peptide receptor
radiotherapy completed on [DATE].

EXAM:
NUCLEAR MEDICINE PET SKULL BASE TO THIGH
TECHNIQUE: 4.97 mCi Ga 68 DOTATATE was injected intravenously. Full-ring PET
imaging was performed from the skull base to thigh after the
radiotracer. CT data was obtained and used for attenuation
correction and anatomic localization.

[Series 4: ct sk_thigh 5.0 b31f · axial · 5.0mm · 0.98mm/px · 1 of 235 slices shown]
[im 235/235  brain]
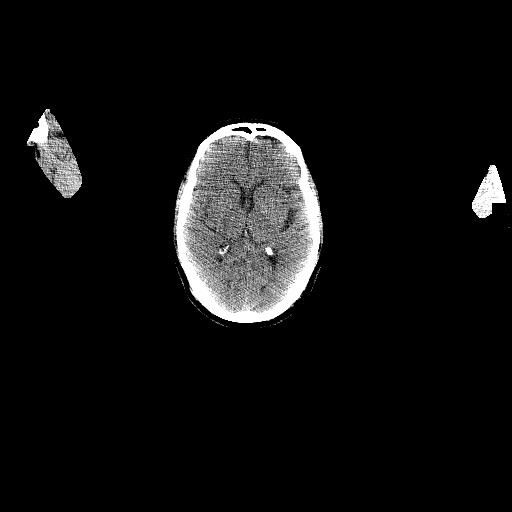

[1 of 25 positions shown; findings below may reference images not displayed]

FINDINGS: NECK

No radiotracer activity in neck lymph nodes.

Incidental CT findings: None

CHEST

No radiotracer accumulation within mediastinal or hilar lymph nodes.
No suspicious pulmonary nodules on the CT scan.

Incidental CT finding:None

ABDOMEN/PELVIS

Again demonstrated lesions within LEFT RIGHT hepatic lobe with
intense radiotracer activity. Example lesion in the LEFT lateral
hepatic lobe measures 4.2 cm compared to 4.9 cm with SUV max equal
51 compared SUV max equal 48.

Example lesion in the central liver measuring 3.0 cm (image 108/4)
compares with 3.3 cm with SUV max equal 46 compared SUV max equal
45. Largest lesion partially exophytic from the inferior margin of
the RIGHT hepatic lobe measures 9.3 cm (image 124/4) compared to
cm with intense peripheral radiotracer activity. The activity along
the margin is intense with SUV max No

new lesions within the liver.

Focus radiotracer activity at the uncinate of the pancreas unchanged
from prior and favored physiologic.

Lesion central mesentery with partial calcification measuring 2.2 cm
compared to 2.3 cm with SUV max equal 47 compared SUV max equal 38.
No new lesions within the peritoneal space.

Physiologic activity noted in the liver, spleen, adrenal glands and
kidneys.

Incidental CT findings:None

SKELETON

Single subtle lesion in the T12 vertebral body with SUV max equal
8.0 compared SUV max equal 6.7. This lesions less than 1 cm.

Incidental CT findings:None
IMPRESSION: 1. Stable exam with no evidence of well differentiated
neuroendocrine tumor progression.
2. Multifocal hepatic metastasis unchanged in size or radiotracer
activity. No new lesions in the liver.
3. Stable central mesenteric partially calcified metastatic nodule
unchanged.
4. No new peritoneal disease.
5. Stable small skeletal metastasis at T12.

## 2020-02-27 MED ORDER — GALLIUM GA 68 DOTATATE IV KIT
4.9700 | PACK | Freq: Once | INTRAVENOUS | Status: AC
  Administered 2020-02-27: 4.97 via INTRAVENOUS

## 2020-03-03 ENCOUNTER — Inpatient Hospital Stay: Payer: Medicare HMO

## 2020-03-03 ENCOUNTER — Inpatient Hospital Stay (HOSPITAL_BASED_OUTPATIENT_CLINIC_OR_DEPARTMENT_OTHER): Payer: Medicare HMO | Admitting: Hematology & Oncology

## 2020-03-03 ENCOUNTER — Telehealth: Payer: Self-pay | Admitting: Hematology & Oncology

## 2020-03-03 ENCOUNTER — Other Ambulatory Visit: Payer: Self-pay

## 2020-03-03 ENCOUNTER — Inpatient Hospital Stay: Payer: Medicare HMO | Attending: Hematology & Oncology

## 2020-03-03 ENCOUNTER — Encounter: Payer: Self-pay | Admitting: Hematology & Oncology

## 2020-03-03 VITALS — BP 132/60 | HR 66 | Temp 97.3°F | Resp 18 | Wt 210.0 lb

## 2020-03-03 DIAGNOSIS — C7B8 Other secondary neuroendocrine tumors: Secondary | ICD-10-CM | POA: Insufficient documentation

## 2020-03-03 DIAGNOSIS — K861 Other chronic pancreatitis: Secondary | ICD-10-CM

## 2020-03-03 DIAGNOSIS — D500 Iron deficiency anemia secondary to blood loss (chronic): Secondary | ICD-10-CM

## 2020-03-03 DIAGNOSIS — D509 Iron deficiency anemia, unspecified: Secondary | ICD-10-CM | POA: Diagnosis not present

## 2020-03-03 DIAGNOSIS — C7A8 Other malignant neuroendocrine tumors: Secondary | ICD-10-CM | POA: Diagnosis not present

## 2020-03-03 LAB — CMP (CANCER CENTER ONLY)
ALT: 16 U/L (ref 0–44)
AST: 24 U/L (ref 15–41)
Albumin: 3.9 g/dL (ref 3.5–5.0)
Alkaline Phosphatase: 66 U/L (ref 38–126)
Anion gap: 7 (ref 5–15)
BUN: 17 mg/dL (ref 8–23)
CO2: 30 mmol/L (ref 22–32)
Calcium: 9.8 mg/dL (ref 8.9–10.3)
Chloride: 104 mmol/L (ref 98–111)
Creatinine: 1.15 mg/dL (ref 0.61–1.24)
GFR, Est AFR Am: >60 mL/min (ref >60)
GFR, Estimated: >60 mL/min (ref >60)
Glucose, Bld: 180 mg/dL — ABNORMAL HIGH (ref 70–99)
Potassium: 4.7 mmol/L (ref 3.5–5.1)
Sodium: 141 mmol/L (ref 135–145)
Total Bilirubin: 0.6 mg/dL (ref 0.3–1.2)
Total Protein: 7.1 g/dL (ref 6.5–8.1)

## 2020-03-03 LAB — CBC WITH DIFFERENTIAL (CANCER CENTER ONLY)
Abs Immature Granulocytes: 0.01 10*3/uL (ref 0.00–0.07)
Basophils Absolute: 0 10*3/uL (ref 0.0–0.1)
Basophils Relative: 1 %
Eosinophils Absolute: 0.3 10*3/uL (ref 0.0–0.5)
Eosinophils Relative: 6 %
HCT: 39.6 % (ref 39.0–52.0)
Hemoglobin: 13 g/dL (ref 13.0–17.0)
Immature Granulocytes: 0 %
Lymphocytes Relative: 16 %
Lymphs Abs: 0.8 10*3/uL (ref 0.7–4.0)
MCH: 31.3 pg (ref 26.0–34.0)
MCHC: 32.8 g/dL (ref 30.0–36.0)
MCV: 95.2 fL (ref 80.0–100.0)
Monocytes Absolute: 0.2 10*3/uL (ref 0.1–1.0)
Monocytes Relative: 4 %
Neutro Abs: 3.9 10*3/uL (ref 1.7–7.7)
Neutrophils Relative %: 73 %
Platelet Count: 146 10*3/uL — ABNORMAL LOW (ref 150–400)
RBC: 4.16 MIL/uL — ABNORMAL LOW (ref 4.22–5.81)
RDW: 13.9 % (ref 11.5–15.5)
WBC Count: 5.3 10*3/uL (ref 4.0–10.5)
nRBC: 0 % (ref 0.0–0.2)

## 2020-03-03 LAB — LACTATE DEHYDROGENASE: LDH: 184 U/L (ref 98–192)

## 2020-03-03 MED ORDER — LANREOTIDE ACETATE 120 MG/0.5ML ~~LOC~~ SOLN
SUBCUTANEOUS | Status: AC
  Filled 2020-03-03: qty 120

## 2020-03-03 MED ORDER — LANREOTIDE ACETATE 120 MG/0.5ML ~~LOC~~ SOLN
120.0000 mg | Freq: Once | SUBCUTANEOUS | Status: AC
  Administered 2020-03-03: 120 mg via SUBCUTANEOUS

## 2020-03-03 NOTE — Progress Notes (Signed)
Hematology and Oncology Follow Up Visit  Brian Mays [id number] Nov 16, 1945 74 y.o. 09/05/2017    Principle Diagnosis:  Metastatic low grade neuroendocrine tumor-hepatic metastases Iron def anemia  Current Therapy:   S/p yttrium-90 intrahepatic therapy - November 2016 Somatuline 120 mg monthly Lutathera (Lu 177) injection on 02/01/2018 - s/p cycle #4 IV Iron w/ Injectafer -- dose given on 04/29/2019    Interim History: Brian Mays is back for follow-up.  He looks quite good.  He feels okay.  He is still trying to lose a little bit of weight.  I am a little surprised that his blood sugar was up somewhat this morning.  This will clearly have to be watched.  We did do a dotatate PET scan on him last week.  Thankfully, everything looks absolutely stable with respect to the neuroendocrine tumor.  His weight is actually up 6 pounds since we saw him about a month ago.  His last chromogranin A level back in early April was 158 which was little bit better.  He has had no problems with diarrhea.  He has had no cough or shortness of breath.  It sounds like his wife is having a lot of pulmonary issues.  There is been no leg swelling.  He has had no rashes.  He has had no headache.  Overall, his performance status is ECOG 1.   Medications:  Allergies as of 09/05/2017      Reactions   Iohexol Hives    Code: HIVES, Desc: PER [name] @ [facility]IMARY [facility], PT IS ALLERGIC TO CONTRAST DYE 10/02/08/RM  05/01/10...needs full premeds per our protocol w/ [facility]., Onset Date: [id number]      Medication List        Accurate as of 09/05/17 11:45 AM. Always use your most recent med list.          amLODipine 5 MG tablet Commonly known as:  NORVASC Take 5 mg by mouth daily.   aspirin EC 81 MG tablet Take 81 mg by mouth every morning.   atorvastatin 20 MG tablet Commonly known as:  LIPITOR Take 20 mg daily by mouth.   Cholecalciferol 1000 units tablet Take 1,000 Units by mouth daily.     diphenhydrAMINE 50 MG tablet Commonly known as:  BENADRYL Take 1 tablet (50 mg total) by mouth once. Take 1 hour prior to scan.   Fish Oil 1000 MG Caps Take 1 capsule by mouth daily. Reported on 12/30/2015   fluticasone 50 MCG/ACT nasal spray Commonly known as:  FLONASE Place 2 sprays into both nostrils daily as needed (sinuses).   hydrocortisone 25 MG suppository Commonly known as:  ANUSOL-HC INSERT 1 SUPPOSITORY RECTALLY TWICE A DAY AS NEEDED FOR HEMORRHOIDS   lansoprazole 30 MG capsule Commonly known as:  PREVACID Take 1 capsule (30 mg total) by mouth daily at 12 noon.   levothyroxine 50 MCG tablet Commonly known as:  SYNTHROID, LEVOTHROID Take 1 tablet (50 mcg total) by mouth daily.   losartan 100 MG tablet Commonly known as:  COZAAR TAKE 1 TABLET (100 MG TOTAL) BY MOUTH AT BEDTIME.   metoprolol tartrate 25 MG tablet Commonly known as:  LOPRESSOR Take 1 tablet (25 mg total) by mouth 2 (two) times daily.   naproxen 500 MG tablet Commonly known as:  NAPROSYN Take 1 tablet (500 mg total) by mouth 2 (two) times daily with a meal.   nitroGLYCERIN 0.4 MG SL tablet Commonly known as:  NITROSTAT Place 1 tablet (0.4 mg total) under  the tongue every 5 (five) minutes x 3 doses as needed for chest pain.   predniSONE 50 MG tablet Commonly known as:  DELTASONE Take 50mg  at 13 hours, 7 hours, and 1 hour before scan   sodium chloride 0.65 % Soln nasal spray Commonly known as:  OCEAN Place 1 spray into both nostrils as needed for congestion.   SOMATULINE DEPOT [place] Inject 120 mcg into the skin every 28 (twenty-eight) days. Receives at Dr Antonieta Pert office   ticagrelor 90 MG Tabs tablet Commonly known as:  BRILINTA Take 1 tablet (90 mg total) by mouth 2 (two) times daily.   tiZANidine 4 MG tablet Commonly known as:  ZANAFLEX Take 1 tablet (4 mg total) by mouth every 6 (six) hours as needed for muscle spasms.   vitamin B-12 1000 MCG tablet Commonly known as:   CYANOCOBALAMIN Take 1,000 mcg by mouth daily.       Allergies:  Allergies  Allergen Reactions  . Iohexol Hives     Code: HIVES, Desc: PER [name] @ [facility]IMARY [facility], PT IS ALLERGIC TO CONTRAST DYE 10/02/08/RM  05/01/10...needs full premeds per our protocol w/ [facility]., Onset Date: [id number]     Past Medical History, Surgical history, Social history, and Family History were reviewed and updated.  Review of Systems: Review of Systems  Constitutional: Negative for appetite change, fatigue, fever and unexpected weight change.  HENT:   Negative for lump/mass, mouth sores, sore throat and trouble swallowing.   Respiratory: Negative for cough, hemoptysis and shortness of breath.   Cardiovascular: Negative for leg swelling and palpitations.  Gastrointestinal: Negative for abdominal distention, abdominal pain, blood in stool, constipation, diarrhea, nausea and vomiting.  Genitourinary: Negative for bladder incontinence, dysuria, frequency and hematuria.   Musculoskeletal: Negative for arthralgias, back pain, gait problem and myalgias.  Skin: Negative for itching and rash.  Neurological: Negative for dizziness, extremity weakness, gait problem, headaches, numbness, seizures and speech difficulty.  Hematological: Does not bruise/bleed easily.  Psychiatric/Behavioral: Negative for depression and sleep disturbance. The patient is not nervous/anxious.      Physical Exam:  weight is 209 lb 4 oz (92.2 kg). His oral temperature is 98.3 F (36.8 C). His blood pressure is 113/63 and his pulse is 70. His respiration is 18 and oxygen saturation is 98%.   Wt Readings from Last 3 Encounters:  09/05/17 203 lb 4 oz (92.2 kg)  08/25/17 205 lb (93 kg)  07/25/17 205 lb (93 kg)    Physical Exam Vitals reviewed.  HENT:     Head: Normocephalic and atraumatic.  Eyes:     Pupils: Pupils are equal, round, and reactive to light.  Cardiovascular:     Rate and Rhythm: Normal rate and regular rhythm.     Heart  sounds: Normal heart sounds.  Pulmonary:     Effort: Pulmonary effort is normal.     Breath sounds: Normal breath sounds.  Abdominal:     General: Bowel sounds are normal.     Palpations: Abdomen is soft.  Musculoskeletal:        General: No tenderness or deformity. Normal range of motion.     Cervical back: Normal range of motion.  Lymphadenopathy:     Cervical: No cervical adenopathy.  Skin:    General: Skin is warm and dry.     Findings: No erythema or rash.  Neurological:     Mental Status: He is alert and oriented to person, place, and time.  Psychiatric:  Behavior: Behavior normal.        Thought Content: Thought content normal.        Judgment: Judgment normal.   .   Lab Results  Component Value Date   WBC 6.4 09/05/2017   HGB 12.9 (L) 09/05/2017   HCT 40.1 09/05/2017   MCV 83 09/05/2017   PLT 142 (L) 09/05/2017   No results found for: FERRITIN, IRON, TIBC, UIBC, IRONPCTSAT Lab Results  Component Value Date   RBC 4.81 09/05/2017   No results found for: KPAFRELGTCHN, LAMBDASER, KAPLAMBRATIO No results found for: IGGSERUM, IGA, IGMSERUM No results found for: Odetta Pink, SPEI   Chemistry      Component Value Date/Time   NA 145 09/05/2017 1015   NA 141 07/23/2016 1255   K 4.2 09/05/2017 1015   K 4.1 07/23/2016 1255   CL 105 09/05/2017 1015   CO2 29 09/05/2017 1015   CO2 27 07/23/2016 1255   BUN 16 09/05/2017 1015   BUN 13.8 07/23/2016 1255   CREATININE 1.1 09/05/2017 1015   CREATININE 1.0 07/23/2016 1255      Component Value Date/Time   CALCIUM 9.9 09/05/2017 1015   CALCIUM 9.2 07/23/2016 1255   ALKPHOS 66 09/05/2017 1015   ALKPHOS 77 07/23/2016 1255   AST 38 09/05/2017 1015   AST 23 07/23/2016 1255   ALT 31 09/05/2017 1015   ALT 13 07/23/2016 1255   BILITOT 1.00 09/05/2017 1015   BILITOT 1.13 07/23/2016 1255     Impression and Plan: Mr. Oberbroeckling is a 74 year old white male. He has  metastatic low-grade neuroendocrine carcinoma. He has hepatic metastases.  He will get his Somatuline today.  I am happy that the dotatate PET scan looked so good.  I do not think we have to repeat another one probably for least 4 months or so.  Again the blood sugar might be an issue.  We will have to check a hemoglobin A1c when we see him back.  If this is elevated, I think his family doctor will have to step into try to help.  We will have to see about doing scans on him.   I would like to think that his iron levels should be okay.  We will plan to get him back in 1 month.    Burney Gauze, MD  11/5/201811:45 AM

## 2020-03-03 NOTE — Telephone Encounter (Signed)
Appointments scheduled calendar declined due to My Chart access  5/3 los

## 2020-03-04 LAB — CHROMOGRANIN A: Chromogranin A (ng/mL): 144.6 ng/mL — ABNORMAL HIGH (ref 0.0–101.8)

## 2020-04-02 ENCOUNTER — Inpatient Hospital Stay: Payer: Medicare HMO

## 2020-04-02 ENCOUNTER — Encounter: Payer: Self-pay | Admitting: Hematology & Oncology

## 2020-04-02 ENCOUNTER — Inpatient Hospital Stay (HOSPITAL_BASED_OUTPATIENT_CLINIC_OR_DEPARTMENT_OTHER): Payer: Medicare HMO | Admitting: Hematology & Oncology

## 2020-04-02 ENCOUNTER — Inpatient Hospital Stay: Payer: Medicare HMO | Attending: Hematology & Oncology

## 2020-04-02 ENCOUNTER — Other Ambulatory Visit: Payer: Self-pay

## 2020-04-02 VITALS — BP 141/70 | HR 71 | Temp 96.9°F | Resp 19 | Wt 209.0 lb

## 2020-04-02 DIAGNOSIS — D500 Iron deficiency anemia secondary to blood loss (chronic): Secondary | ICD-10-CM

## 2020-04-02 DIAGNOSIS — R7309 Other abnormal glucose: Secondary | ICD-10-CM | POA: Insufficient documentation

## 2020-04-02 DIAGNOSIS — C7B8 Other secondary neuroendocrine tumors: Secondary | ICD-10-CM | POA: Diagnosis not present

## 2020-04-02 DIAGNOSIS — C7A8 Other malignant neuroendocrine tumors: Secondary | ICD-10-CM | POA: Diagnosis present

## 2020-04-02 DIAGNOSIS — K861 Other chronic pancreatitis: Secondary | ICD-10-CM

## 2020-04-02 LAB — CMP (CANCER CENTER ONLY)
ALT: 17 U/L (ref 0–44)
AST: 22 U/L (ref 15–41)
Albumin: 4 g/dL (ref 3.5–5.0)
Alkaline Phosphatase: 61 U/L (ref 38–126)
Anion gap: 6 (ref 5–15)
BUN: 18 mg/dL (ref 8–23)
CO2: 33 mmol/L — ABNORMAL HIGH (ref 22–32)
Calcium: 9.8 mg/dL (ref 8.9–10.3)
Chloride: 101 mmol/L (ref 98–111)
Creatinine: 1.05 mg/dL (ref 0.61–1.24)
GFR, Est AFR Am: >60 mL/min (ref >60)
GFR, Estimated: >60 mL/min (ref >60)
Glucose, Bld: 123 mg/dL — ABNORMAL HIGH (ref 70–99)
Potassium: 4.6 mmol/L (ref 3.5–5.1)
Sodium: 140 mmol/L (ref 135–145)
Total Bilirubin: 0.6 mg/dL (ref 0.3–1.2)
Total Protein: 7.5 g/dL (ref 6.5–8.1)

## 2020-04-02 LAB — CBC WITH DIFFERENTIAL (CANCER CENTER ONLY)
Abs Immature Granulocytes: 0.03 10*3/uL (ref 0.00–0.07)
Basophils Absolute: 0 10*3/uL (ref 0.0–0.1)
Basophils Relative: 1 %
Eosinophils Absolute: 0.3 10*3/uL (ref 0.0–0.5)
Eosinophils Relative: 6 %
HCT: 40.4 % (ref 39.0–52.0)
Hemoglobin: 13.2 g/dL (ref 13.0–17.0)
Immature Granulocytes: 1 %
Lymphocytes Relative: 17 %
Lymphs Abs: 1 10*3/uL (ref 0.7–4.0)
MCH: 31.4 pg (ref 26.0–34.0)
MCHC: 32.7 g/dL (ref 30.0–36.0)
MCV: 96.2 fL (ref 80.0–100.0)
Monocytes Absolute: 0.4 10*3/uL (ref 0.1–1.0)
Monocytes Relative: 7 %
Neutro Abs: 4 10*3/uL (ref 1.7–7.7)
Neutrophils Relative %: 68 %
Platelet Count: 151 10*3/uL (ref 150–400)
RBC: 4.2 MIL/uL — ABNORMAL LOW (ref 4.22–5.81)
RDW: 13.2 % (ref 11.5–15.5)
WBC Count: 5.7 10*3/uL (ref 4.0–10.5)
nRBC: 0 % (ref 0.0–0.2)

## 2020-04-02 LAB — HEMOGLOBIN A1C
Hgb A1c MFr Bld: 6.3 % — ABNORMAL HIGH (ref 4.8–5.6)
Mean Plasma Glucose: 134.11 mg/dL

## 2020-04-02 MED ORDER — LANREOTIDE ACETATE 120 MG/0.5ML ~~LOC~~ SOLN
120.0000 mg | Freq: Once | SUBCUTANEOUS | Status: AC
  Administered 2020-04-02: 120 mg via SUBCUTANEOUS

## 2020-04-02 MED ORDER — LANREOTIDE ACETATE 120 MG/0.5ML ~~LOC~~ SOLN
SUBCUTANEOUS | Status: AC
  Filled 2020-04-02: qty 120

## 2020-04-02 NOTE — Progress Notes (Signed)
Hematology and Oncology Follow Up Visit  Brian Mays [id number] 11-Jul-1946 74 y.o. 09/05/2017    Principle Diagnosis:  Metastatic low grade neuroendocrine tumor-hepatic metastases Iron def anemia  Current Therapy:   S/p yttrium-90 intrahepatic therapy - November 2016 Somatuline 120 mg monthly Lutathera (Lu 177) injection on 02/01/2018 - s/p cycle #4 IV Iron w/ Injectafer -- dose given on 04/29/2019    Interim History: Brian Mays is back for follow-up.  He looks quite good.  Unfortunately, one of his best friends died of leukemia.  Sounds like he probably had a chronic lymphocytic leukemia.  He had it for about 10 years.  He passed away I think over the weekend.  His wife is doing better.  She was having her health issues.  He has been playing golf once a week.  He is enjoying this.  His last Chromogranin A level was stable at 145.  He has had no problems with diarrhea.  Is had no cough or shortness of breath.  He has gained a little bit of weight.  He is trying to watch his weight.  He has had no problems with blood pressure.  He has had no issues with his heart overall.  Last iron studies that we did on back in April showed a ferritin of 322 with an iron saturation of 27%.  Overall, his performance status is ECOG 1.   Medications:  Allergies as of 09/05/2017      Reactions   Iohexol Hives    Code: HIVES, Desc: PER [name] @ [facility]IMARY [facility]ARE, PT IS ALLERGIC TO CONTRAST DYE 10/02/08/RM  05/01/10...needs full premeds per our protocol w/ [facility]., Onset Date: [id number]      Medication List        Accurate as of 09/05/17 11:45 AM. Always use your most recent med list.          amLODipine 5 MG tablet Commonly known as:  NORVASC Take 5 mg by mouth daily.   aspirin EC 81 MG tablet Take 81 mg by mouth every morning.   atorvastatin 20 MG tablet Commonly known as:  LIPITOR Take 20 mg daily by mouth.   Cholecalciferol 1000 units tablet Take 1,000 Units by mouth daily.     diphenhydrAMINE 50 MG tablet Commonly known as:  BENADRYL Take 1 tablet (50 mg total) by mouth once. Take 1 hour prior to scan.   Fish Oil 1000 MG Caps Take 1 capsule by mouth daily. Reported on 12/30/2015   fluticasone 50 MCG/ACT nasal spray Commonly known as:  FLONASE Place 2 sprays into both nostrils daily as needed (sinuses).   hydrocortisone 25 MG suppository Commonly known as:  ANUSOL-HC INSERT 1 SUPPOSITORY RECTALLY TWICE A DAY AS NEEDED FOR HEMORRHOIDS   lansoprazole 30 MG capsule Commonly known as:  PREVACID Take 1 capsule (30 mg total) by mouth daily at 12 noon.   levothyroxine 50 MCG tablet Commonly known as:  SYNTHROID, LEVOTHROID Take 1 tablet (50 mcg total) by mouth daily.   losartan 100 MG tablet Commonly known as:  COZAAR TAKE 1 TABLET (100 MG TOTAL) BY MOUTH AT BEDTIME.   metoprolol tartrate 25 MG tablet Commonly known as:  LOPRESSOR Take 1 tablet (25 mg total) by mouth 2 (two) times daily.   naproxen 500 MG tablet Commonly known as:  NAPROSYN Take 1 tablet (500 mg total) by mouth 2 (two) times daily with a meal.   nitroGLYCERIN 0.4 MG SL tablet Commonly known as:  NITROSTAT Place 1 tablet (0.4  mg total) under the tongue every 5 (five) minutes x 3 doses as needed for chest pain.   predniSONE 50 MG tablet Commonly known as:  DELTASONE Take 50mg  at 13 hours, 7 hours, and 1 hour before scan   sodium chloride 0.65 % Soln nasal spray Commonly known as:  OCEAN Place 1 spray into both nostrils as needed for congestion.   SOMATULINE DEPOT [place] Inject 120 mcg into the skin every 28 (twenty-eight) days. Receives at Dr Antonieta Pert office   ticagrelor 90 MG Tabs tablet Commonly known as:  BRILINTA Take 1 tablet (90 mg total) by mouth 2 (two) times daily.   tiZANidine 4 MG tablet Commonly known as:  ZANAFLEX Take 1 tablet (4 mg total) by mouth every 6 (six) hours as needed for muscle spasms.   vitamin B-12 1000 MCG tablet Commonly known as:   CYANOCOBALAMIN Take 1,000 mcg by mouth daily.       Allergies:  Allergies  Allergen Reactions  . Iohexol Hives     Code: HIVES, Desc: PER [name] @ [facility]IMARY [facility]ARE, PT IS ALLERGIC TO CONTRAST DYE 10/02/08/RM  05/01/10...needs full premeds per our protocol w/ [facility]., Onset Date: [id number]     Past Medical History, Surgical history, Social history, and Family History were reviewed and updated.  Review of Systems: Review of Systems  Constitutional: Negative for appetite change, fatigue, fever and unexpected weight change.  HENT:   Negative for lump/mass, mouth sores, sore throat and trouble swallowing.   Respiratory: Negative for cough, hemoptysis and shortness of breath.   Cardiovascular: Negative for leg swelling and palpitations.  Gastrointestinal: Negative for abdominal distention, abdominal pain, blood in stool, constipation, diarrhea, nausea and vomiting.  Genitourinary: Negative for bladder incontinence, dysuria, frequency and hematuria.   Musculoskeletal: Negative for arthralgias, back pain, gait problem and myalgias.  Skin: Negative for itching and rash.  Neurological: Negative for dizziness, extremity weakness, gait problem, headaches, numbness, seizures and speech difficulty.  Hematological: Does not bruise/bleed easily.  Psychiatric/Behavioral: Negative for depression and sleep disturbance. The patient is not nervous/anxious.      Physical Exam:  weight is 209 lb 4 oz (92.2 kg). His oral temperature is 98.3 F (36.8 C). His blood pressure is 113/63 and his pulse is 70. His respiration is 18 and oxygen saturation is 98%.   Wt Readings from Last 3 Encounters:  09/05/17 203 lb 4 oz (92.2 kg)  08/25/17 205 lb (93 kg)  07/25/17 205 lb (93 kg)    Physical Exam Vitals reviewed.  HENT:     Head: Normocephalic and atraumatic.  Eyes:     Pupils: Pupils are equal, round, and reactive to light.  Cardiovascular:     Rate and Rhythm: Normal rate and regular rhythm.     Heart  sounds: Normal heart sounds.  Pulmonary:     Effort: Pulmonary effort is normal.     Breath sounds: Normal breath sounds.  Abdominal:     General: Bowel sounds are normal.     Palpations: Abdomen is soft.  Musculoskeletal:        General: No tenderness or deformity. Normal range of motion.     Cervical back: Normal range of motion.  Lymphadenopathy:     Cervical: No cervical adenopathy.  Skin:    General: Skin is warm and dry.     Findings: No erythema or rash.  Neurological:     Mental Status: He is alert and oriented to person, place, and time.  Psychiatric:  Behavior: Behavior normal.        Thought Content: Thought content normal.        Judgment: Judgment normal.   .   Lab Results  Component Value Date   WBC 6.4 09/05/2017   HGB 12.9 (L) 09/05/2017   HCT 40.1 09/05/2017   MCV 83 09/05/2017   PLT 142 (L) 09/05/2017   No results found for: FERRITIN, IRON, TIBC, UIBC, IRONPCTSAT Lab Results  Component Value Date   RBC 4.81 09/05/2017   No results found for: KPAFRELGTCHN, LAMBDASER, KAPLAMBRATIO No results found for: IGGSERUM, IGA, IGMSERUM No results found for: Odetta Pink, SPEI   Chemistry      Component Value Date/Time   NA 145 09/05/2017 1015   NA 141 07/23/2016 1255   K 4.2 09/05/2017 1015   K 4.1 07/23/2016 1255   CL 105 09/05/2017 1015   CO2 29 09/05/2017 1015   CO2 27 07/23/2016 1255   BUN 16 09/05/2017 1015   BUN 13.8 07/23/2016 1255   CREATININE 1.1 09/05/2017 1015   CREATININE 1.0 07/23/2016 1255      Component Value Date/Time   CALCIUM 9.9 09/05/2017 1015   CALCIUM 9.2 07/23/2016 1255   ALKPHOS 66 09/05/2017 1015   ALKPHOS 77 07/23/2016 1255   AST 38 09/05/2017 1015   AST 23 07/23/2016 1255   ALT 31 09/05/2017 1015   ALT 13 07/23/2016 1255   BILITOT 1.00 09/05/2017 1015   BILITOT 1.13 07/23/2016 1255     Impression and Plan: Brian Mays is a 74 year old white male. He has  metastatic low-grade neuroendocrine carcinoma. He has hepatic metastases.  I am happy that his blood sugars are doing better.  This is always a concern.  He will get his Somatuline today.  He has done well with the Somatuline.  I do not think we have to do any scans on him probably until August or maybe September.  We will have him come back in 1 month.    Burney Gauze, MD  11/5/201811:45 AM

## 2020-04-02 NOTE — Patient Instructions (Signed)
Lanreotide injection What is this medicine? LANREOTIDE (lan REE oh tide) is used to reduce blood levels of growth hormone in patients with a condition called acromegaly. It also works to slow or stop tumor growth in patients with neuroendocrine tumors and treat carcinoid syndrome. This medicine may be used for other purposes; ask your health care provider or pharmacist if you have questions. COMMON BRAND NAME(S): Somatuline [place] What should I tell my health care provider before I take this medicine? They need to know if you have any of these conditions:  diabetes  gallbladder disease  heart disease  kidney disease  liver disease  thyroid disease  an unusual or allergic reaction to lanreotide, other medicines, foods, dyes, or preservatives  pregnant or trying to get pregnant  breast-feeding How should I use this medicine? This medicine is for injection under the skin. It is given by a health care professional in a hospital or clinic setting. Contact your pediatrician or health care professional regarding the use of this medicine in children. Special care may be needed. Overdosage: If you think you have taken too much of this medicine contact a poison control center or emergency room at once. NOTE: This medicine is only for you. Do not share this medicine with others. What if I miss a dose? It is important not to miss your dose. Call your doctor or health care professional if you are unable to keep an appointment. What may interact with this medicine? This medicine may interact with the following medications:  bromocriptine  cyclosporine  certain medicines for blood pressure, heart disease, irregular heart beat  certain medicines for diabetes  quinidine  terfenadine This list may not describe all possible interactions. Give your health care provider a list of all the medicines, herbs, non-prescription drugs, or dietary supplements you use. Also tell them if you smoke,  drink alcohol, or use illegal drugs. Some items may interact with your medicine. What should I watch for while using this medicine? Tell your doctor or healthcare professional if your symptoms do not start to get better or if they get worse. Visit your doctor or health care professional for regular checks on your progress. Your condition will be monitored carefully while you are receiving this medicine. This medicine may increase blood sugar. Ask your healthcare provider if changes in diet or medicines are needed if you have diabetes. You may need blood work done while you are taking this medicine. Women should inform their doctor if they wish to become pregnant or think they might be pregnant. There is a potential for serious side effects to an unborn child. Talk to your health care professional or pharmacist for more information. Do not breast-feed an infant while taking this medicine or for 6 months after stopping it. This medicine has caused ovarian failure in some women. This medicine may interfere with the ability to have a child. Talk with your doctor or health care professional if you are concerned about your fertility. What side effects may I notice from receiving this medicine? Side effects that you should report to your doctor or health care professional as soon as possible:  allergic reactions like skin rash, itching or hives, swelling of the face, lips, or tongue  increased blood pressure  severe stomach pain  signs and symptoms of hgh blood sugar such as being more thirsty or hungry or having to urinate more than normal. You may also feel very tired or have blurry vision.  signs and symptoms of low blood   sugar such as feeling anxious; confusion; dizziness; increased hunger; unusually weak or tired; sweating; shakiness; cold; irritable; headache; blurred vision; fast heartbeat; loss of consciousness  unusually slow heartbeat Side effects that usually do not require medical  attention (report to your doctor or health care professional if they continue or are bothersome):  constipation  diarrhea  dizziness  headache  muscle pain  muscle spasms  nausea  pain, redness, or irritation at site where injected This list may not describe all possible side effects. Call your doctor for medical advice about side effects. You may report side effects to FDA at 1-800-FDA-1088. Where should I keep my medicine? This drug is given in a hospital or clinic and will not be stored at home. NOTE: This sheet is a summary. It may not cover all possible information. If you have questions about this medicine, talk to your doctor, pharmacist, or health care provider.  2020 Elsevier/Gold Standard (2018-07-27 09:13:08)  

## 2020-04-03 LAB — CHROMOGRANIN A: Chromogranin A (ng/mL): 122.1 ng/mL — ABNORMAL HIGH (ref 0.0–101.8)

## 2020-04-09 ENCOUNTER — Other Ambulatory Visit: Payer: Self-pay

## 2020-04-09 ENCOUNTER — Emergency Department (HOSPITAL_COMMUNITY)
Admission: EM | Admit: 2020-04-09 | Discharge: 2020-04-10 | Disposition: A | Discharge status: Home / Self Care | Payer: Medicare HMO | Attending: Emergency Medicine | Admitting: Emergency Medicine

## 2020-04-09 ENCOUNTER — Emergency Department (HOSPITAL_COMMUNITY): Payer: Medicare HMO

## 2020-04-09 ENCOUNTER — Encounter (HOSPITAL_COMMUNITY): Payer: Self-pay | Admitting: Emergency Medicine

## 2020-04-09 DIAGNOSIS — Z79899 Other long term (current) drug therapy: Secondary | ICD-10-CM | POA: Insufficient documentation

## 2020-04-09 DIAGNOSIS — I2510 Atherosclerotic heart disease of native coronary artery without angina pectoris: Secondary | ICD-10-CM | POA: Insufficient documentation

## 2020-04-09 DIAGNOSIS — R0789 Other chest pain: Secondary | ICD-10-CM | POA: Diagnosis not present

## 2020-04-09 DIAGNOSIS — I10 Essential (primary) hypertension: Secondary | ICD-10-CM | POA: Insufficient documentation

## 2020-04-09 DIAGNOSIS — Z87891 Personal history of nicotine dependence: Secondary | ICD-10-CM | POA: Diagnosis not present

## 2020-04-09 DIAGNOSIS — R42 Dizziness and giddiness: Secondary | ICD-10-CM | POA: Diagnosis not present

## 2020-04-09 DIAGNOSIS — Z7982 Long term (current) use of aspirin: Secondary | ICD-10-CM | POA: Diagnosis not present

## 2020-04-09 DIAGNOSIS — C7A8 Other malignant neuroendocrine tumors: Secondary | ICD-10-CM | POA: Diagnosis not present

## 2020-04-09 DIAGNOSIS — E039 Hypothyroidism, unspecified: Secondary | ICD-10-CM | POA: Insufficient documentation

## 2020-04-09 DIAGNOSIS — R5383 Other fatigue: Secondary | ICD-10-CM | POA: Diagnosis not present

## 2020-04-09 DIAGNOSIS — C7B1 Secondary Merkel cell carcinoma: Secondary | ICD-10-CM | POA: Insufficient documentation

## 2020-04-09 LAB — BASIC METABOLIC PANEL
Anion gap: 10 (ref 5–15)
BUN: 15 mg/dL (ref 8–23)
CO2: 26 mmol/L (ref 22–32)
Calcium: 9 mg/dL (ref 8.9–10.3)
Chloride: 102 mmol/L (ref 98–111)
Creatinine, Ser: 0.93 mg/dL (ref 0.61–1.24)
GFR calc Af Amer: >60 mL/min (ref >60)
GFR calc non Af Amer: >60 mL/min (ref >60)
Glucose, Bld: 119 mg/dL — ABNORMAL HIGH (ref 70–99)
Potassium: 4.1 mmol/L (ref 3.5–5.1)
Sodium: 138 mmol/L (ref 135–145)

## 2020-04-09 LAB — CBC
HCT: 39.2 % (ref 39.0–52.0)
Hemoglobin: 12.9 g/dL — ABNORMAL LOW (ref 13.0–17.0)
MCH: 31.7 pg (ref 26.0–34.0)
MCHC: 32.9 g/dL (ref 30.0–36.0)
MCV: 96.3 fL (ref 80.0–100.0)
Platelets: 147 10*3/uL — ABNORMAL LOW (ref 150–400)
RBC: 4.07 MIL/uL — ABNORMAL LOW (ref 4.22–5.81)
RDW: 13.2 % (ref 11.5–15.5)
WBC: 7.2 10*3/uL (ref 4.0–10.5)
nRBC: 0 % (ref 0.0–0.2)

## 2020-04-09 LAB — TROPONIN I (HIGH SENSITIVITY)
Troponin I (High Sensitivity): 3 ng/L (ref <18)
Troponin I (High Sensitivity): 3 ng/L (ref <18)

## 2020-04-09 IMAGING — CR DG CHEST 2V
2 series · 2 of 2 positions shown · non-contrast
Comparison: [DATE], [DATE]

CLINICAL DATA: Chest discomfort, coronary artery disease,
metastatic neuroendocrine tumor

EXAM:
CHEST - 2 VIEW

[chest pa]
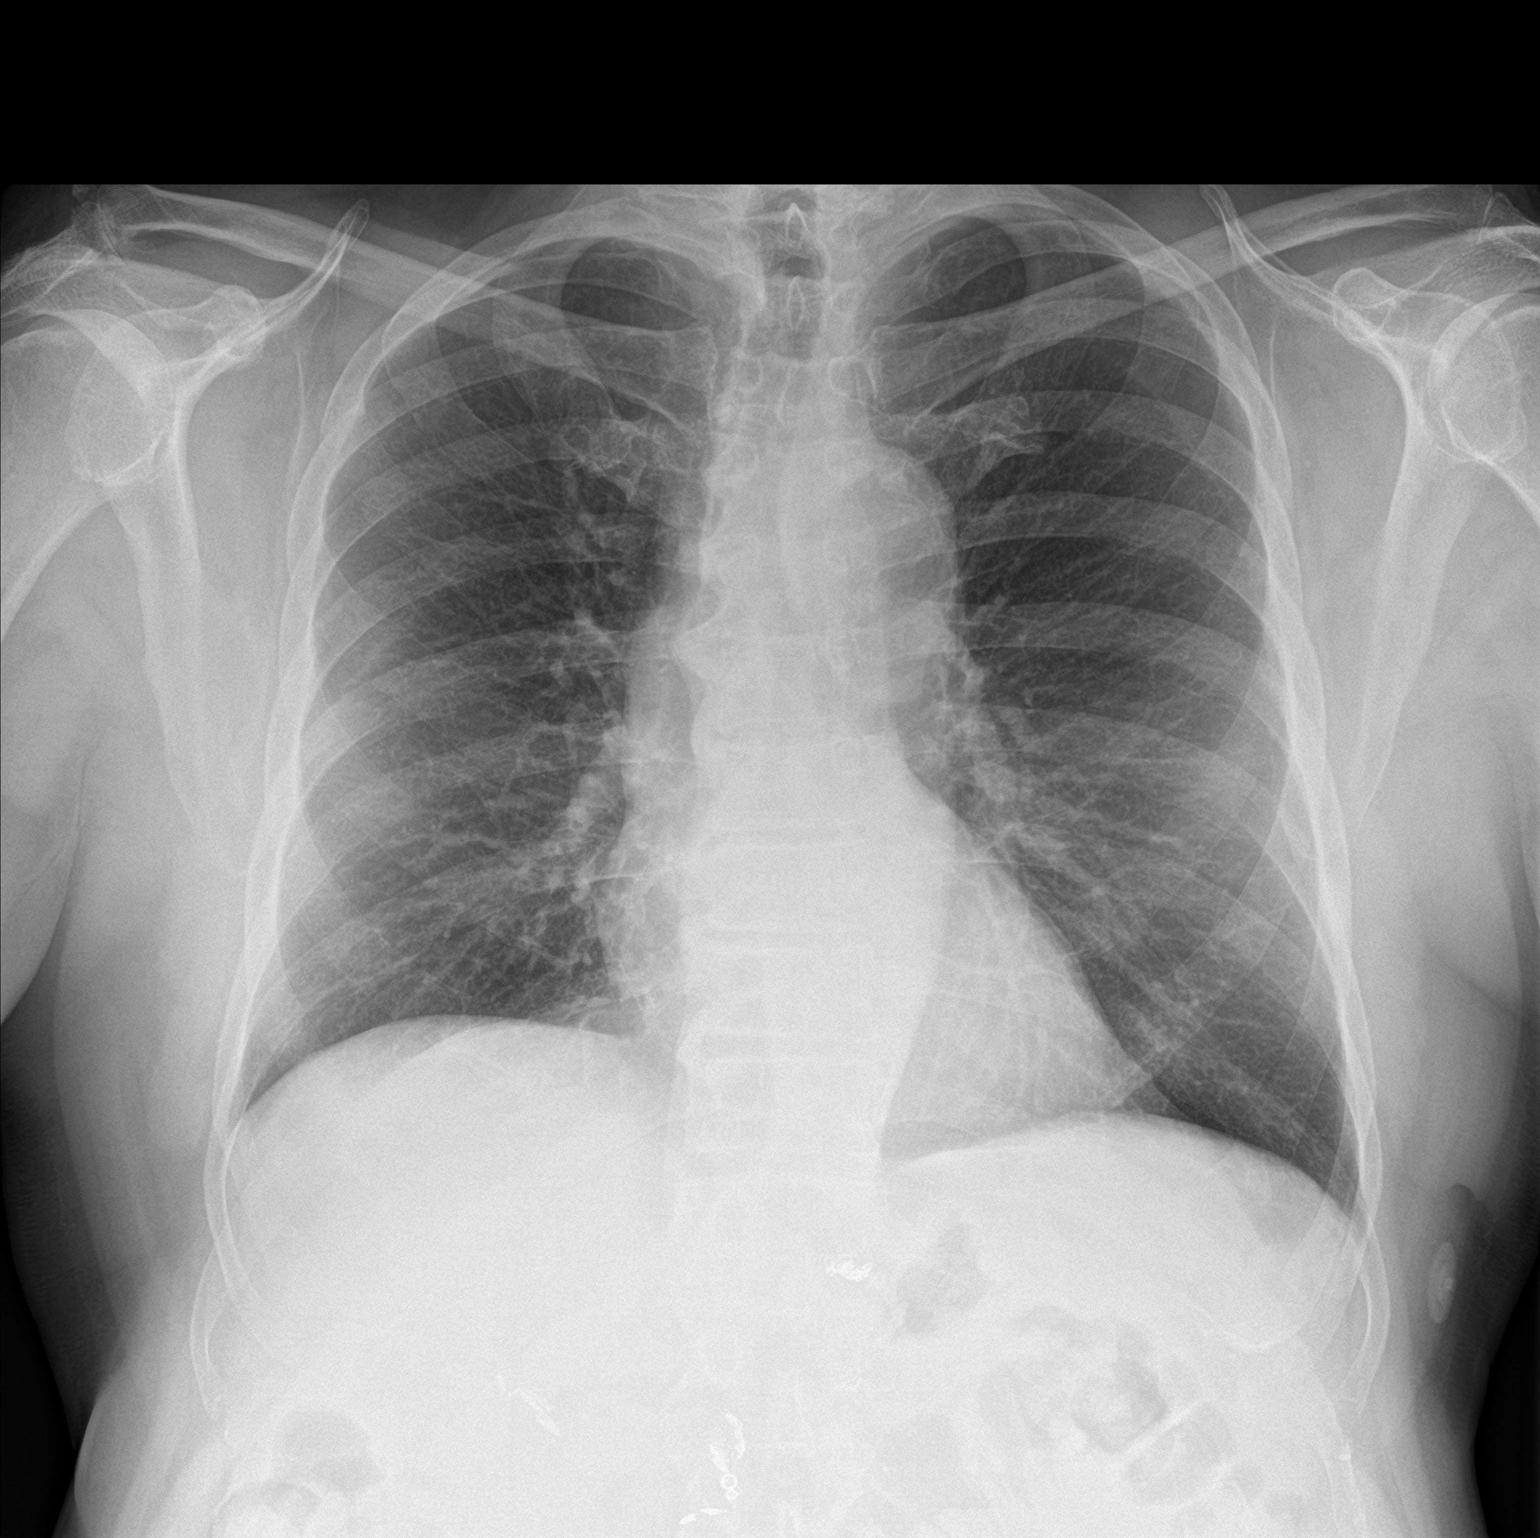

[chest lat]
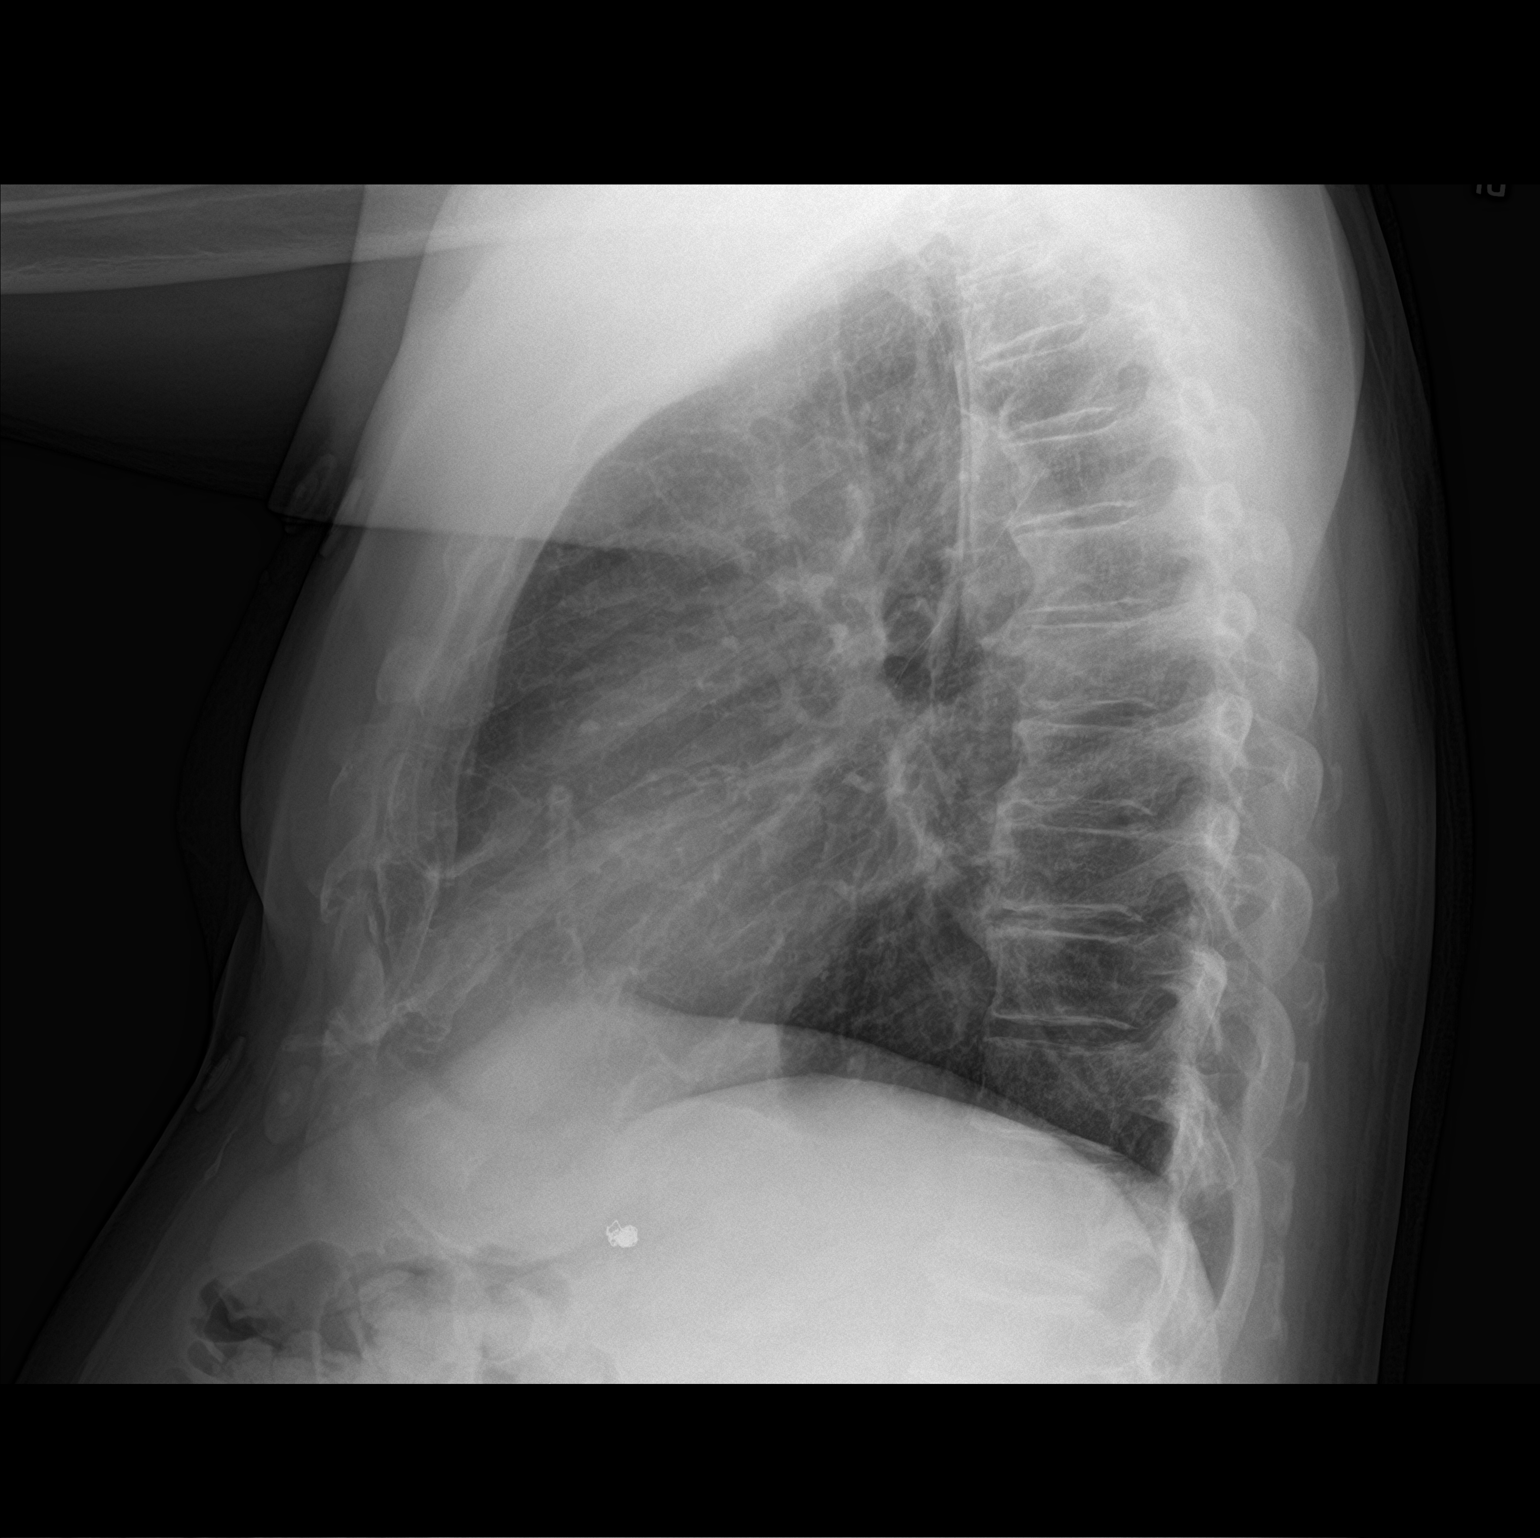

[2 of 2 positions shown; findings below may reference images not displayed]

FINDINGS: Frontal and lateral views of the chest demonstrate an unremarkable
cardiac silhouette. No airspace disease, effusion, or pneumothorax.
No acute bony abnormalities. Embolic coils upper abdomen unchanged.
IMPRESSION: 1. No acute intrathoracic process.

## 2020-04-09 MED ORDER — SODIUM CHLORIDE 0.9% FLUSH: 3.0000 mL | Freq: Once | INTRAVENOUS | Status: DC

## 2020-04-09 NOTE — ED Notes (Signed)
Pt denies any chest pain and st's the dizziness only lasted a short period.  Pt st's he just thought he needed to be checked out.  Pt alert and oriented x's 3.  Skin warm and dry, color appropriate.  Monitor Sinus Brady rate of 60.

## 2020-04-09 NOTE — Discharge Instructions (Addendum)
1.  Call Dr. Sheralyn Boatman office in the morning.  Schedule follow-up as soon as possible. 2.  Return to the emergency department if you develop any chest pressure, shortness of breath, recurrence of lightheadedness, feeling like you will pass out or any other concerning symptoms.

## 2020-04-09 NOTE — ED Provider Notes (Signed)
[facility] EMERGENCY DEPARTMENT Provider Note   CSN: [id number] Arrival date & time: 04/09/20  1511     History Chief Complaint  Patient presents with  . Dizziness    Brian Mays is a 74 y.o. male.  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  He had not been working for too long and he noticed he got suddenly lightheaded.  That symptom lasted for about a minute.  It was not a spinning quality.  He reports that it passed pretty quickly.  He went inside and sat down for a bit.  He reports he felt kind of fatigued for a while but that resolved and he feels back to normal.  Patient was concerned because when he had a heart attack previously he had similar type of symptoms of lightheadedness and fatigue.  Takes a daily aspirin.  He has been well recently.  He has not been having problems with exertional shortness of breath or chest pain.    Past Medical History:  Diagnosis Date  . Anemia    in past  . Colon polyps   . Diverticulosis   . Gallstones   . GERD (gastroesophageal reflux disease)   . Heart attack ([place])    mild, Spring 2017  . Heart murmur   . Hemorrhoid   . Hiatal hernia   . HTN (hypertension)   . Hypothyroidism   . Iron deficiency anemia due to chronic blood loss 03/29/2019  . 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 Metastatic carcinoma ([place]) 2010   Dr Jonette Eva  . Pancreatitis 1998   chronic    Patient Active Problem List   Diagnosis Date Noted  . Iron deficiency anemia due to chronic blood loss 03/29/2019  . 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 Lumbar radiculopathy 08/16/2017  . CAD (coronary artery disease) 09/21/2016  . Unstable angina ([place]) 02/09/2016  . Vitamin D deficiency 03/11/2015  . Metastatic malignant neuroendocrine tumor to liver ([place]) 02/14/2015  . Hyperglycemia 04/03/2012  . Diarrhea 03/29/2012  . B12 deficiency 04/19/2011  . TOBACCO USE, QUIT 10/13/2009  . Unspecified vitamin D deficiency 06/19/2009  .  Neoplasm by body site 12/23/2008  . DIVERTICULOSIS, COLON 11/08/2008  . ABDOMINAL PAIN 10/02/2008  . Hypothyroidism 12/13/2007  . HEMORRHOIDS, NOS 12/13/2007  . COLONIC POLYPS, HX OF 12/13/2007  . Essential hypertension 09/15/2007  . GERD 09/15/2007  . PANCREATITIS, CHRONIC 09/15/2007  . CARDIAC MURMUR 09/15/2007  . SNORING 09/15/2007    Past Surgical History:  Procedure Laterality Date  . APPENDECTOMY  1962  . CARDIAC CATHETERIZATION N/A 02/10/2016   Procedure: Left Heart Cath and Coronary Angiography;  Surgeon: Adrian Prows, MD;  Location: [facility];  Service: Cardiovascular;  Laterality: N/A;  . CARDIAC CATHETERIZATION  02/10/2016   Procedure: Coronary/Graft Atherectomy;  Surgeon: Adrian Prows, MD;  Location: [facility];  Service: Cardiovascular;;  . CARDIAC CATHETERIZATION  02/10/2016   Procedure: Coronary Stent Intervention;  Surgeon: Adrian Prows, MD;  Location: [facility];  Service: Cardiovascular;;  . CATARACT EXTRACTION W/ INTRAOCULAR LENS  IMPLANT, BILATERAL Bilateral   . IR GENERIC HISTORICAL  12/30/2015   IR RADIOLOGIST EVAL & MGMT 12/30/2015 Aletta Edouard, MD [facility] INTERV RAD  . IR GENERIC HISTORICAL  11/09/2016   IR RADIOLOGIST EVAL & MGMT 11/09/2016 Aletta Edouard, MD [facility] INTERV RAD  . IR RADIOLOGIST EVAL & MGMT  03/01/2017  . IR RADIOLOGIST EVAL & MGMT  06/29/2017  . LAPAROSCOPIC CHOLECYSTECTOMY  1999  .  LIVER BIOPSY  2010  . RADIOACTIVE SEED IMPLANT  X 3   "to my liver"  . TUMOR EXCISION  01/2009   Carcinoil Resection   . TUMOR REMOVAL     from small intestine       Family History  Problem Relation Age of Onset  . Kidney disease Mother   . Hyperlipidemia Mother   . Hypertension Mother   . COPD Father   . Ulcerative colitis Daughter     Social History   Tobacco Use  . Smoking status: Former Smoker    Packs/day: 1.50    Years: 35.00    Pack years: 52.50    Types: Cigarettes    Start date: 09/25/1959    Quit date: 12/21/1997    Years  since quitting: 22.3  . Smokeless tobacco: Never Used  Substance Use Topics  . Alcohol use: Yes    Alcohol/week: 3.0 - 4.0 standard drinks    Types: 3 - 4 Cans of beer per week    Comment: 4 beers weekly   . Drug use: No    Home Medications Prior to Admission medications   Medication Sig Start Date End Date Taking? Authorizing Provider  aspirin EC 81 MG tablet Take 81 mg by mouth every morning.   Yes [provider]  atorvastatin (LIPITOR) 20 MG tablet TAKE 1 TABLET (20 MG TOTAL) BY MOUTH DAILY AT 6 PM. 11/30/19  Yes Nahser, Wonda Cheng, MD  Cholecalciferol 1000 UNITS tablet Take 1,000 Units by mouth daily.     Yes [provider]  clopidogrel (PLAVIX) 75 MG tablet TAKE 1 TABLET BY MOUTH EVERY DAY 08/23/19  Yes Nahser, Wonda Cheng, MD  fluticasone (FLONASE) 50 MCG/ACT nasal spray Place 2 sprays into both nostrils daily as needed (sinuses).  01/06/15  Yes [provider]  hydrochlorothiazide (HYDRODIURIL) 25 MG tablet TAKE 1/2 TABLET BY MOUTH EVERY DAY Patient taking differently: Take 12.5 mg by mouth daily.  10/20/19  Yes Nahser, Wonda Cheng, MD  hydrocortisone (ANUSOL-HC) 25 MG suppository INSERT 1 SUPPOSITORY RECTALLY TWICE A DAY AS NEEDED FOR HEMORRHOIDS Patient taking differently: Place 25 mg rectally 2 (two) times daily as needed for hemorrhoids.  02/07/15  Yes [place], Holli Humbles, NP  ipratropium (ATROVENT) 0.03 % nasal spray Place 2 sprays into the nose 3 (three) times daily. Patient taking differently: Place 2 sprays into the nose 2 (two) times daily.  03/05/19  Yes Copland, Gay Filler, MD  Lanreotide Acetate (SOMATULINE DEPOT [place]) Inject 120 mcg into the skin every 28 (twenty-eight) days. Receives at Dr Antonieta Pert office   Yes [provider]  lansoprazole (PREVACID) 30 MG capsule Take 1 capsule (30 mg total) by mouth daily at 12 noon. 11/19/16  Yes Copland, Gay Filler, MD  levothyroxine (SYNTHROID) 50 MCG tablet Take 1 tablet (50 mcg total) by mouth daily. 03/05/19   Yes Copland, Gay Filler, MD  losartan (COZAAR) 100 MG tablet TAKE 1 TABLET BY MOUTH EVERY DAY 08/23/19  Yes Nahser, Wonda Cheng, MD  metoprolol tartrate (LOPRESSOR) 25 MG tablet TAKE 0.5 TABLETS (12.5 MG TOTAL) BY MOUTH 2 (TWO) TIMES DAILY. 01/29/20  Yes Nahser, Wonda Cheng, MD  nitroGLYCERIN (NITROSTAT) 0.4 MG SL tablet PLACE 1 TABLET UNDER THE TONGUE EVERY 5 (FIVE) MINUTES X 3 DOSES AS NEEDED FOR CHEST PAIN. Patient taking differently: Place 0.4 mg under the tongue every 5 (five) minutes as needed for chest pain.  08/29/19  Yes Nahser, Wonda Cheng, MD  predniSONE (DELTASONE) 50 MG tablet Take orally  13 hours, 7 hours and 1 hour prior to CT scan contrast 07/11/19  Yes Ennever, Rudell Cobb, MD  sodium chloride (OCEAN) 0.65 % SOLN nasal spray Place 1 spray into both nostrils as needed for congestion.    Yes [provider]  vitamin B-12 (CYANOCOBALAMIN) 1000 MCG tablet Take 1,000 mcg by mouth every evening.    Yes [provider]  bacitracin ointment Apply 1 application topically 2 (two) times daily. Patient not taking: Reported on 04/09/2020 10/16/18   Varney Biles, MD    Allergies    Iohexol  Review of Systems   Review of Systems 10 systems reviewed and negative except as per HPI. Physical Exam Updated Vital Signs BP (!) 164/70 (BP Location: Right Arm)   Pulse 63   Temp 97.9 F (36.6 C) (Oral)   Resp 15   SpO2 100%   Physical Exam Constitutional:      Appearance: He is well-developed.  HENT:     Head: Normocephalic and atraumatic.  Eyes:     Pupils: Pupils are equal, round, and reactive to light.  Cardiovascular:     Rate and Rhythm: Normal rate and regular rhythm.     Heart sounds: Normal heart sounds.  Pulmonary:     Effort: Pulmonary effort is normal.     Breath sounds: Normal breath sounds.  Abdominal:     General: Bowel sounds are normal. There is no distension.     Palpations: Abdomen is soft.     Tenderness: There is no abdominal tenderness.  Musculoskeletal:          General: Normal range of motion.     Cervical back: Neck supple.  Skin:    General: Skin is warm and dry.  Neurological:     Mental Status: He is alert and oriented to person, place, and time.     GCS: GCS eye subscore is 4. GCS verbal subscore is 5. GCS motor subscore is 6.     Coordination: Coordination normal.     ED Results / Procedures / Treatments   Labs (all labs ordered are listed, but only abnormal results are displayed) Labs Reviewed  BASIC METABOLIC PANEL - Abnormal; Notable for the following components:      Result Value   Glucose, Bld 119 (*)    All other components within normal limits  CBC - Abnormal; Notable for the following components:   RBC 4.07 (*)    Hemoglobin 12.9 (*)    Platelets 147 (*)    All other components within normal limits  TROPONIN I (HIGH SENSITIVITY)  TROPONIN I (HIGH SENSITIVITY)    EKG EKG Interpretation  Date/Time:  Wednesday April 09 2020 15:21:50 EDT Ventricular Rate:  66 PR Interval:  202 QRS Duration: 78 QT Interval:  400 QTC Calculation: 419 R Axis:   -6 Text Interpretation: Normal sinus rhythm Normal ECG agree, no change Confirmed by Charlesetta Shanks [phone]) on 04/09/2020 10:23:46 PM   Radiology DG Chest 2 View  Result Date: 04/09/2020 CLINICAL DATA:  Chest discomfort, coronary artery disease, metastatic neuroendocrine tumor EXAM: CHEST - 2 VIEW COMPARISON:  02/27/2020, 02/09/2016 FINDINGS: Frontal and lateral views of the chest demonstrate an unremarkable cardiac silhouette. No airspace disease, effusion, or pneumothorax. No acute bony abnormalities. Embolic coils upper abdomen unchanged. IMPRESSION: 1. No acute intrathoracic process. Electronically Signed   By: Randa Ngo M.D.   On: 04/09/2020 16:11    Procedures Procedures (including critical care time)  Medications Ordered in ED Medications  sodium chloride  flush (NS) 0.9 % injection 3 mL (has no administration in time range)    ED Course  I have reviewed the  triage vital signs and the nursing notes.  Pertinent labs & imaging results that were available during my care of the patient were reviewed by me and considered in my medical decision making (see chart for details).    MDM Rules/Calculators/A&P                      This is a cardiac enzymes negative.  EKG without changes.  Patient symptoms resolved spontaneously.  Atypical presentation for cardiac ischemic etiology.  Patient is advised for close follow-up with cardiology for recheck.  No associated headache or neurologic dysfunction.  At this time, stable for discharge.  Return precautions reviewed. Final Clinical Impression(s) / ED Diagnoses Final diagnoses:  Lightheadedness  Fatigue, unspecified type    Rx / DC Orders ED Discharge Orders    None       Charlesetta Shanks, MD 04/09/20 2311

## 2020-04-09 NOTE — ED Triage Notes (Signed)
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 States he has a heart attack x 3 years ago with the same symptoms. Denies chest pain, shortness of breath, nausea, diaphoresis.

## 2020-04-22 ENCOUNTER — Encounter: Payer: Self-pay | Admitting: Cardiovascular Disease

## 2020-04-22 ENCOUNTER — Ambulatory Visit: Payer: Medicare HMO | Admitting: Cardiovascular Disease

## 2020-04-22 ENCOUNTER — Other Ambulatory Visit: Payer: Self-pay

## 2020-04-22 VITALS — BP 106/58 | HR 66 | Ht 70.5 in | Wt 208.4 lb

## 2020-04-22 DIAGNOSIS — R55 Syncope and collapse: Secondary | ICD-10-CM | POA: Insufficient documentation

## 2020-04-22 DIAGNOSIS — I10 Essential (primary) hypertension: Secondary | ICD-10-CM | POA: Diagnosis not present

## 2020-04-22 NOTE — Patient Instructions (Addendum)
Medication Instructions:  Your physician has recommended you make the following change in your medication:  STOP HCTZ (Hydrochlorothiazide or Microzide)  *If you need a refill on your cardiac medications before your next appointment, please call your pharmacy*   Lab Work: None Ordered If you have labs (blood work) drawn today and your tests are completely normal, you will receive your results only by: [name] MyChart Message (if you have MyChart) OR . A paper copy in the mail If you have any lab test that is abnormal or we need to change your treatment, we will call you to review the results.   Testing/Procedures: HOW TO TAKE YOUR BLOOD PRESSURE:  Rest 5 minutes before taking your blood pressure.   Don't smoke or drink caffeinated beverages for at least 30 minutes before.  Take your blood pressure before (not after) you eat.  Sit comfortably with your back supported and both feet on the floor (don't cross your legs).  Elevate your arm to heart level on a table or a desk.  Use the proper sized cuff. It should fit smoothly and snugly around your bare upper arm. There should be enough room to slip a fingertip under the cuff. The bottom edge of the cuff should be 1 inch above the crease of the elbow.  Ideally, take 3 measurements at one sitting and record the average.    Follow-Up: At [facility], you and your health needs are our priority.  As part of our continuing mission to provide you with exceptional heart care, we have created designated Provider Care Teams.  These Care Teams include your primary Cardiologist (physician) and Advanced Practice Providers (APPs -  Physician Assistants and Nurse Practitioners) who all work together to provide you with the care you need, when you need it.   Your next appointment:   6 month(s) on Friday Dec. 3  The format for your next appointment:   In Person  Provider:   Mertie Moores, MD

## 2020-04-22 NOTE — Progress Notes (Signed)
Patient ID: Brian Mays; [id number]; 07-02-1946   Admit date: (Not on file) Date of Consult: 04/22/2020  Primary Care Provider: Darreld Mclean, MD Primary Cardiologist:  Chae Shuster  Primary Electrophysiologist:     Problem list 1.  Coronary artery disease: Status post rotational atherectomy and stenting of the RCA 2.  Hypertension 3.  Hyperlipidemia 4.  Hypothyroidism  Patient Profile:   Brian Mays is a 74 y.o. male with a hx of HTN, , coronary artery disease with a recent episode of weakness and near syncope.  who is being seen today for the evaluation of HTN  at the request of Dr. Marin Olp. He sees Dr. Marin Olp for Carcinoid       Mr. Sai Zinn is seen wife wife , [name] today   Hx of coronary stenting in March 2017 .   Was started on Metoprolol, he has had intermittent weakness and dizziness/lightheadedness since that time.  He eats and drinks regularly.   Generally gets some regular exercise.  Has had some back issues recently.  He still goes to the line does some exercises that do not cause further back pain.  Dr. Marin Olp asked him to reduce his BP meds.   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  [facility] . Does have some lightheadedness after working out .  No CP or dyspnea while working out.   No synmptoms similar to his MI.    He stopped the Losartan last week He decreased the metoprolol to 12. 5 mg BID last week.   Still eats salty foods. Wife cooks from [employer] .  Eats some pretzels.   Has flushing related to Carcinoid.    Typically occurs if he drinks some beer .   Feb.  12, 2019: Doing well Gaining some weight .   Gave him the OK to walk  No CP or dyspnea   Aug. 27 ,2019:  Doing well.   Feeling better since he decreased his metoprolol in half.   September 15, 2018: Seen back today for follow-up of his coronary artery disease, hypertension,  hyperlipidemia. He increase his losartan 3 weeks ago. No CP  BP readings at home have been elevated.  Is not exercising   August 06, 2019:  [name] is seen back today for follow-up of his coronary artery disease, hypertension, hyperlipidemia. BP has been well controlled at home .  No CP , is active , not doing exercise per se Tries to avoid salty foods.   April 22, 2020:  [name] is seen today for follow up for his CAD, HTN, and hyperlipidemia. Several weeks ago he developed some light headedness.  Was helping a customer unload paint.  Was bending over - up and down repeatedly  Symptoms ~ c/w orthostasis   Went to the ER  Denies any chest pain .   Associated with some fatigue.  Had not skipped any meals,  May have been a bit volume depleted. ( had walked 5 miles the day before )  Labs looked ok .   Troponin were negative x 2.  ECG was normal    Past Medical History:  Diagnosis Date  . Anemia    in past  . Colon polyps   . Diverticulosis   . Gallstones   . GERD (gastroesophageal reflux disease)   . Heart attack (HCC)    mild,  Spring 2017  . Heart murmur   . Hemorrhoid   . Hiatal hernia   . HTN (hypertension)   . Hypothyroidism   . Iron deficiency anemia due to chronic blood loss 03/29/2019  . 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 Metastatic carcinoma ([place]) 2010   Dr Jonette Eva  . Pancreatitis 1998   chronic    Past Surgical History:  Procedure Laterality Date  . APPENDECTOMY  1962  . CARDIAC CATHETERIZATION N/A 02/10/2016   Procedure: Left Heart Cath and Coronary Angiography;  Surgeon: Adrian Prows, MD;  Location: [facility];  Service: Cardiovascular;  Laterality: N/A;  . CARDIAC CATHETERIZATION  02/10/2016   Procedure: Coronary/Graft Atherectomy;  Surgeon: Adrian Prows, MD;  Location: [facility];  Service: Cardiovascular;;  . CARDIAC CATHETERIZATION  02/10/2016   Procedure: Coronary Stent Intervention;  Surgeon: Adrian Prows, MD;  Location: [facility];   Service: Cardiovascular;;  . CATARACT EXTRACTION W/ INTRAOCULAR LENS  IMPLANT, BILATERAL Bilateral   . IR GENERIC HISTORICAL  12/30/2015   IR RADIOLOGIST EVAL & MGMT 12/30/2015 Aletta Edouard, MD [facility] INTER[facility] RAD  . IR GENERIC HISTORICAL  11/09/2016   IR RADIOLOGIST EVAL & MGMT 11/09/2016 Aletta Edouard, MD [facility] INTERV RAD  . IR RADIOLOGIST EVAL & MGMT  03/01/2017  . IR RADIOLOGIST EVAL & MGMT  06/29/2017  . LAPAROSCOPIC CHOLECYSTECTOMY  1999  . LIVER BIOPSY  2010  . RADIOACTIVE SEED IMPLANT  X 3   "to my liver"  . TUMOR EXCISION  01/2009   Carcinoil Resection   . TUMOR REMOVAL     from small intestine     Home Medications:  Prior to Admission medications   Medication Sig Start Date End Date Taking? Authorizing Provider  amLODipine (NORVASC) 5 MG tablet Take 5 mg by mouth daily. 03/16/17  Yes [provider]  aspirin EC 81 MG tablet Take 81 mg by mouth every morning.   Yes [provider]  atorvastatin (LIPITOR) 20 MG tablet Take 20 mg daily by mouth.   Yes [provider]  Cholecalciferol 1000 UNITS tablet Take 1,000 Units by mouth daily.     Yes [provider]  fluticasone (FLONASE) 50 MCG/ACT nasal spray Place 2 sprays into both nostrils daily as needed (sinuses).  01/06/15  Yes [provider]  hydrocortisone (ANUSOL-HC) 25 MG suppository INSERT 1 SUPPOSITORY RECTALLY TWICE A DAY AS NEEDED FOR HEMORRHOIDS 02/07/15  Yes [place], Holli Humbles, NP  Lanreotide Acetate (SOMATULINE DEPOT [place]) Inject 120 mcg into the skin every 28 (twenty-eight) days. Receives at Dr Antonieta Pert office   Yes [provider]  lansoprazole (PREVACID) 30 MG capsule Take 1 capsule (30 mg total) by mouth daily at 12 noon. 11/19/16  Yes [place], Gay Filler, MD  levothyroxine (SYNTHROID, LEVOTHROID) 50 MCG tablet Take 1 tablet (50 mcg total) by mouth daily. 08/09/17  Yes Cop[place], Gay Filler, MD  naproxen (NAPROSYN) 500 MG tablet Take 1 tablet (500 mg total) by mouth 2 (two) times  daily with a meal. 08/16/17  Yes Leandrew Koyanagi, MD  nitroGLYCERIN (NITROSTAT) 0.4 MG SL tablet Place 1 tablet (0.4 mg total) under the tongue every 5 (five) minutes x 3 doses as needed for chest pain. 02/11/16  Yes Adrian Prows, MD  Omega-3 Fatty Acids (FISH OIL) 1000 MG CAPS Take 1 capsule by mouth daily. Reported on 12/30/2015   Yes [provider]  predniSONE (DELTASONE) 50 MG tablet Take 50mg  at 13 hours, 7 hours, and 1 hour before scan  02/18/17  Yes [place], Holli Humbles, NP  sodium chloride (OCEAN) 0.65 % SOLN nasal spray Place 1 spray into both nostrils as needed for congestion.    Yes [provider]  ticagrelor (BRILINTA) 90 MG TABS tablet Take 1 tablet (90 mg total) by mouth 2 (two) times daily. 02/11/16  Yes Adrian Prows, MD  tiZANidine (ZANAFLEX) 4 MG tablet Take 1 tablet (4 mg total) by mouth every 6 (six) hours as needed for muscle spasms. 08/16/17  Yes Leandrew Koyanagi, MD  vitamin B-12 (CYANOCOBALAMIN) 1000 MCG tablet Take 1,000 mcg by mouth daily.   Yes [provider]  diphenhydrAMINE (BENADRYL) 50 MG tablet Take 1 tablet (50 mg total) by mouth once. Take 1 hour prior to scan. 02/18/17 02/18/17  [place], Holli Humbles, NP  metoprolol tartrate (LOPRESSOR) 25 MG tablet Take 0.5 tablets (12.5 mg total) 2 (two) times daily by mouth. 09/14/17   Shyonna Carlin, Wonda Cheng, MD     Allergies:    Allergies  Allergen Reactions  . Iohexol Hives     Code: HIVES, Desc: PER [name] @ [facility]IMARY CARE, PT IS ALLERGIC TO CONTRAST DYE 10/02/08/RM  05/01/10...needs full premeds per our protocol w/ [facility]., Onset Date: [id number]     Social History:   Social History   Socioeconomic History  . Marital status: Married    Spouse name: Not on file  . Number of children: 1  . Years of education: Not on file  . Highest education level: Not on file  Occupational History  . 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  Tobacco Use  . Smoking status: Former Smoker    Packs/day: 1.50     Years: 35.00    Pack years: 52.50    Types: Cigarettes    Start date: 09/25/1959    Quit date: 12/21/1997    Years since quitting: 22.3  . Smokeless tobacco: Never Used  Vaping Use  . Vaping Use: Never used  Substance and Sexual Activity  . Alcohol use: Yes    Alcohol/week: 3.0 - 4.0 standard drinks    Types: 3 - 4 Cans of beer per week    Comment: 4 beers weekly   . Drug use: No  . Sexual activity: Yes  Other Topics Concern  . Not on file  Social History Narrative   Regular Exercise -  NO   Social Determinants of Health   Financial Resource Strain:   . Difficulty of Paying Living Expenses:   Food Insecurity:   . Worried About Charity fundraiser in the Last Year:   . Arboriculturist in the Last Year:   Transportation Needs:   . Film/video editor (Medical):   [name] Lack of Transportation (Non-Medical):   Physical Activity:   . Days of Exercise per Week:   . Minutes of Exercise per Session:   Stress:   . Feeling of Stress :   Social Connections:   . Frequency of Communication with Friends and Family:   . Frequency of Social Gatherings with Friends and Family:   . Attends Religious Services:   . Active Member of Clubs or Organizations:   . Attends Archivist Meetings:   [name] Marital Status:   Intimate Partner Violence:   . Fear of Current or Ex-Partner:   . Emotionally Abused:   [name] Physically Abused:   . Sexually Abused:     Family History:    Family History  Problem Relation Age of Onset  .  Kidney disease Mother   . Hyperlipidemia Mother   . Hypertension Mother   . COPD Father   . Ulcerative colitis Daughter      ROS:  Noted in current history, all other systems are negative.   Physical Exam: Blood pressure (!) 106/58, pulse 66, height 5' 10.5" (1.791 m), weight 208 lb 6.4 oz (94.5 kg), SpO2 97 %.  GEN:  Well nourished, well developed in no acute distress HEENT: Normal NECK: No JVD; No carotid bruits LYMPHATICS: No lymphadenopathy CARDIAC:  RRR  , soft systolic murmur  RESPIRATORY:  Clear to auscultation without rales, wheezing or rhonchi  ABDOMEN: Soft, non-tender, non-distended MUSCULOSKELETAL:  No edema; No deformity  SKIN: Warm and dry NEUROLOGIC:  Alert and oriented x 3    EKG:     04/09/20:   NSR . No ST or T wave changes.   Relevant CV Studies:    Laboratory Data:  ChemistryNo results for input(s): NA, K, CL, CO2, GLUCOSE, BUN, CREATININE, CALCIUM, GFRNONAA, GFRAA, ANIONGAP in the last 168 hours.  No results for input(s): PROT, ALBUMIN, AST, ALT, ALKPHOS, BILITOT in the last 168 hours. HematologyNo results for input(s): WBC, RBC, HGB, HCT, MCV, MCH, MCHC, RDW, PLT in the last 168 hours. Cardiac EnzymesNo results for input(s): TROPONINI in the last 168 hours. No results for input(s): TROPIPOC in the last 168 hours.  BNPNo results for input(s): BNP, PROBNP in the last 168 hours.  DDimer No results for input(s): DDIMER in the last 168 hours.  Radiology/Studies:  No results found.  Assessment and Plan:     1.  Coronary artery disease:    No angina      2.  Near syncope: The patient had an episode of near syncope.  He was seen in the emergency room and his work-up was unremarkable.  Troponin levels were negative.  Labs were within normal limits.  EKG was normal.  His symptoms sound consistent with orthostatic hypotension.  His blood pressure today is still fairly low.  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  He had walked on a 5 mile walk the day prior and thinks that he might have been a little bit dehydrated.  We will discontinue the HCTZ.  I encouraged him to drink more water. I have instructed him to record his blood pressure on a daily basis and a note pad.  He will call back if he has elevated blood pressures or if he has recurrent symptoms.  3.  Hypertension:      BP is on the low side.    4.  Hyperlipidemia:          For questions or  updates, please contact [place] Please consult [email or web address] for contact info under Cardiology/STEMI.   Signed, Mertie Moores, MD  04/22/2020 12:17 PM

## 2020-04-23 ENCOUNTER — Other Ambulatory Visit: Payer: Self-pay | Admitting: Cardiovascular Disease

## 2020-04-30 ENCOUNTER — Inpatient Hospital Stay: Payer: Medicare HMO

## 2020-04-30 ENCOUNTER — Other Ambulatory Visit: Payer: Self-pay

## 2020-04-30 ENCOUNTER — Inpatient Hospital Stay (HOSPITAL_BASED_OUTPATIENT_CLINIC_OR_DEPARTMENT_OTHER): Payer: Medicare HMO | Admitting: Family

## 2020-04-30 ENCOUNTER — Encounter: Payer: Self-pay | Admitting: Family

## 2020-04-30 VITALS — BP 146/61 | HR 63 | Temp 98.8°F | Resp 18 | Ht 70.5 in | Wt 210.0 lb

## 2020-04-30 DIAGNOSIS — D500 Iron deficiency anemia secondary to blood loss (chronic): Secondary | ICD-10-CM

## 2020-04-30 DIAGNOSIS — C7B8 Other secondary neuroendocrine tumors: Secondary | ICD-10-CM | POA: Diagnosis not present

## 2020-04-30 DIAGNOSIS — C7A8 Other malignant neuroendocrine tumors: Secondary | ICD-10-CM | POA: Diagnosis not present

## 2020-04-30 LAB — CMP (CANCER CENTER ONLY)
ALT: 16 U/L (ref 0–44)
AST: 22 U/L (ref 15–41)
Albumin: 3.8 g/dL (ref 3.5–5.0)
Alkaline Phosphatase: 53 U/L (ref 38–126)
Anion gap: 6 (ref 5–15)
BUN: 12 mg/dL (ref 8–23)
CO2: 28 mmol/L (ref 22–32)
Calcium: 9.3 mg/dL (ref 8.9–10.3)
Chloride: 106 mmol/L (ref 98–111)
Creatinine: 0.94 mg/dL (ref 0.61–1.24)
GFR, Est AFR Am: >60 mL/min (ref >60)
GFR, Estimated: >60 mL/min (ref >60)
Glucose, Bld: 95 mg/dL (ref 70–99)
Potassium: 4.3 mmol/L (ref 3.5–5.1)
Sodium: 140 mmol/L (ref 135–145)
Total Bilirubin: 0.6 mg/dL (ref 0.3–1.2)
Total Protein: 7.1 g/dL (ref 6.5–8.1)

## 2020-04-30 LAB — CBC WITH DIFFERENTIAL (CANCER CENTER ONLY)
Abs Immature Granulocytes: 0.03 10*3/uL (ref 0.00–0.07)
Basophils Absolute: 0.1 10*3/uL (ref 0.0–0.1)
Basophils Relative: 1 %
Eosinophils Absolute: 0.3 10*3/uL (ref 0.0–0.5)
Eosinophils Relative: 4 %
HCT: 39.2 % (ref 39.0–52.0)
Hemoglobin: 12.6 g/dL — ABNORMAL LOW (ref 13.0–17.0)
Immature Granulocytes: 1 %
Lymphocytes Relative: 15 %
Lymphs Abs: 0.9 10*3/uL (ref 0.7–4.0)
MCH: 31.3 pg (ref 26.0–34.0)
MCHC: 32.1 g/dL (ref 30.0–36.0)
MCV: 97.5 fL (ref 80.0–100.0)
Monocytes Absolute: 0.4 10*3/uL (ref 0.1–1.0)
Monocytes Relative: 7 %
Neutro Abs: 4.4 10*3/uL (ref 1.7–7.7)
Neutrophils Relative %: 72 %
Platelet Count: 136 10*3/uL — ABNORMAL LOW (ref 150–400)
RBC: 4.02 MIL/uL — ABNORMAL LOW (ref 4.22–5.81)
RDW: 13.2 % (ref 11.5–15.5)
WBC Count: 6.1 10*3/uL (ref 4.0–10.5)
nRBC: 0 % (ref 0.0–0.2)

## 2020-04-30 LAB — IRON AND TIBC
Iron: 65 ug/dL (ref 42–163)
Saturation Ratios: 23 % (ref 20–55)
TIBC: 278 ug/dL (ref 202–409)
UIBC: 213 ug/dL (ref 117–376)

## 2020-04-30 LAB — FERRITIN: Ferritin: 210 ng/mL (ref 24–336)

## 2020-04-30 MED ORDER — LANREOTIDE ACETATE 120 MG/0.5ML ~~LOC~~ SOLN
120.0000 mg | Freq: Once | SUBCUTANEOUS | Status: AC
  Administered 2020-04-30: 120 mg via SUBCUTANEOUS

## 2020-04-30 MED ORDER — LANREOTIDE ACETATE 120 MG/0.5ML ~~LOC~~ SOLN
SUBCUTANEOUS | Status: AC
  Filled 2020-04-30: qty 120

## 2020-04-30 NOTE — Patient Instructions (Signed)
Lanreotide injection What is this medicine? LANREOTIDE (lan REE oh tide) is used to reduce blood levels of growth hormone in patients with a condition called acromegaly. It also works to slow or stop tumor growth in patients with neuroendocrine tumors and treat carcinoid syndrome. This medicine may be used for other purposes; ask your health care provider or pharmacist if you have questions. COMMON BRAND NAME(S): Somatuline [place] What should I tell my health care provider before I take this medicine? They need to know if you have any of these conditions:  diabetes  gallbladder disease  heart disease  kidney disease  liver disease  thyroid disease  an unusual or allergic reaction to lanreotide, other medicines, foods, dyes, or preservatives  pregnant or trying to get pregnant  breast-feeding How should I use this medicine? This medicine is for injection under the skin. It is given by a health care professional in a hospital or clinic setting. Contact your pediatrician or health care professional regarding the use of this medicine in children. Special care may be needed. Overdosage: If you think you have taken too much of this medicine contact a poison control center or emergency room at once. NOTE: This medicine is only for you. Do not share this medicine with others. What if I miss a dose? It is important not to miss your dose. Call your doctor or health care professional if you are unable to keep an appointment. What may interact with this medicine? This medicine may interact with the following medications:  bromocriptine  cyclosporine  certain medicines for blood pressure, heart disease, irregular heart beat  certain medicines for diabetes  quinidine  terfenadine This list may not describe all possible interactions. Give your health care provider a list of all the medicines, herbs, non-prescription drugs, or dietary supplements you use. Also tell them if you smoke,  drink alcohol, or use illegal drugs. Some items may interact with your medicine. What should I watch for while using this medicine? Tell your doctor or healthcare professional if your symptoms do not start to get better or if they get worse. Visit your doctor or health care professional for regular checks on your progress. Your condition will be monitored carefully while you are receiving this medicine. This medicine may increase blood sugar. Ask your healthcare provider if changes in diet or medicines are needed if you have diabetes. You may need blood work done while you are taking this medicine. Women should inform their doctor if they wish to become pregnant or think they might be pregnant. There is a potential for serious side effects to an unborn child. Talk to your health care professional or pharmacist for more information. Do not breast-feed an infant while taking this medicine or for 6 months after stopping it. This medicine has caused ovarian failure in some women. This medicine may interfere with the ability to have a child. Talk with your doctor or health care professional if you are concerned about your fertility. What side effects may I notice from receiving this medicine? Side effects that you should report to your doctor or health care professional as soon as possible:  allergic reactions like skin rash, itching or hives, swelling of the face, lips, or tongue  increased blood pressure  severe stomach pain  signs and symptoms of hgh blood sugar such as being more thirsty or hungry or having to urinate more than normal. You may also feel very tired or have blurry vision.  signs and symptoms of low blood   sugar such as feeling anxious; confusion; dizziness; increased hunger; unusually weak or tired; sweating; shakiness; cold; irritable; headache; blurred vision; fast heartbeat; loss of consciousness  unusually slow heartbeat Side effects that usually do not require medical  attention (report to your doctor or health care professional if they continue or are bothersome):  constipation  diarrhea  dizziness  headache  muscle pain  muscle spasms  nausea  pain, redness, or irritation at site where injected This list may not describe all possible side effects. Call your doctor for medical advice about side effects. You may report side effects to FDA at 1-800-FDA-1088. Where should I keep my medicine? This drug is given in a hospital or clinic and will not be stored at home. NOTE: This sheet is a summary. It may not cover all possible information. If you have questions about this medicine, talk to your doctor, pharmacist, or health care provider.  2020 Elsevier/Gold Standard (2018-07-27 09:13:08)  

## 2020-04-30 NOTE — Progress Notes (Signed)
Hematology and Oncology Follow Up Visit  YAACOV KOZIOL [id number] 1946-04-17 74 y.o. 04/30/2020   Principle Diagnosis:  Metastatic low grade neuroendocrine tumor-hepatic metastases Iron deficiency anemia  Past Therapy: S/p yttrium-90 intrahepatic therapy - November 2016 Lutathera (Lu 177) injection on 02/01/2018 - s/p cycle 4  Current Therapy:        Somatuline 120 mg monthly IV Iron as indicated    Interim History:  Mr. Mach is here today for follow-up and treatment. He is doing quite well and has no complaints at this time.  He is enjoying golfing several days a week. No fatigue.  Chromogranin A earlier this month was 122.  His blood glucose today is stable at 95. He has maintained a good appetite and is staying well hydrated. His weight is up another 2 lbs.   No fever, chills, n/v, cough, rash, dizziness, SOB, chest pain, pain palpitations, abdominal pain or changes in bowel or bladder habits.  No swelling, tenderness, numbness or tingling in his extremities.  No falls or syncope.  No episodes of bleeding. No bruising or petechiae.   ECOG Performance Status: 1 - Symptomatic but completely ambulatory  Medications:  Allergies as of 04/30/2020      Reactions   Iohexol Hives    Code: HIVES, Desc: PER [name] @ [facility]IMARY [facility]ARE, PT IS ALLERGIC TO CONTRAST DYE 10/02/08/RM  05/01/10...needs full premeds per our protocol w/ [facility]., Onset Date: [id number]      Medication List       Accurate as of April 30, 2020  9:43 AM. If you have any questions, ask your nurse or doctor.        STOP taking these medications   bacitracin ointment Stopped by: Laverna Peace, NP     TAKE these medications   aspirin EC 81 MG tablet Take 81 mg by mouth every morning.   atorvastatin 20 MG tablet Commonly known as: LIPITOR TAKE 1 TABLET (20 MG TOTAL) BY MOUTH DAILY AT 6 PM.   Cholecalciferol 25 MCG (1000 UT) tablet Take 1,000 Units by mouth daily.   clopidogrel 75 MG tablet Commonly  known as: PLAVIX TAKE 1 TABLET BY MOUTH EVERY DAY   fluticasone 50 MCG/ACT nasal spray Commonly known as: FLONASE Place 2 sprays into both nostrils daily as needed (sinuses).   hydrocortisone 25 MG suppository Commonly known as: ANUSOL-HC INSERT 1 SUPPOSITORY RECTALLY TWICE A DAY AS NEEDED FOR HEMORRHOIDS   ipratropium 0.03 % nasal spray Commonly known as: Atrovent Place 2 sprays into the nose 3 (three) times daily.   lansoprazole 30 MG capsule Commonly known as: PREVACID Take 1 capsule (30 mg total) by mouth daily at 12 noon.   levothyroxine 50 MCG tablet Commonly known as: SYNTHROID Take 1 tablet (50 mcg total) by mouth daily.   losartan 100 MG tablet Commonly known as: COZAAR TAKE 1 TABLET BY MOUTH EVERY DAY   metoprolol tartrate 25 MG tablet Commonly known as: LOPRESSOR TAKE 0.5 TABLETS (12.5 MG TOTAL) BY MOUTH 2 (TWO) TIMES DAILY.   nitroGLYCERIN 0.4 MG SL tablet Commonly known as: NITROSTAT PLACE 1 TABLET UNDER THE TONGUE EVERY 5 (FIVE) MINUTES X 3 DOSES AS NEEDED FOR CHEST PAIN.   predniSONE 50 MG tablet Commonly known as: DELTASONE Take orally 13 hours, 7 hours and 1 hour prior to CT scan contrast   sodium chloride 0.65 % Soln nasal spray Commonly known as: OCEAN Place 1 spray into both nostrils as needed for congestion.   SOMATULINE DEPOT [place] Inject 120  mcg into the skin every 28 (twenty-eight) days. Receives at Dr Antonieta Pert office   vitamin B-12 1000 MCG tablet Commonly known as: CYANOCOBALAMIN Take 1,000 mcg by mouth every evening.       Allergies:  Allergies  Allergen Reactions  . Iohexol Hives     Code: HIVES, Desc: PER [name] @ [facility]IMARY [facility]ARE, PT IS ALLERGIC TO CONTRAST DYE 10/02/08/RM  05/01/10...needs full premeds per our protocol w/ [facility]., Onset Date: 16109604     Past Medical History, Surgical history, Social history, and Family History were reviewed and updated.  Review of Systems: All other 10 point review of systems is negative.    Physical Exam:  height is 5' 10.5" (1.791 m) and weight is 210 lb (95.3 kg). His oral temperature is 98.8 F (37.1 C). His blood pressure is 146/61 (abnormal) and his pulse is 63. His respiration is 18 and oxygen saturation is 100%.   Wt Readings from Last 3 Encounters:  04/30/20 210 lb (95.3 kg)  04/22/20 208 lb 6.4 oz (94.5 kg)  04/02/20 209 lb (94.8 kg)    Ocular: Sclerae unicteric, pupils equal, round and reactive to light Ear-nose-throat: Oropharynx clear, dentition fair Lymphatic: No cervical or supraclavicular adenopathy Lungs no rales or rhonchi, good excursion bilaterally Heart regular rate and rhythm, no murmur appreciated Abd soft, nontender, positive bowel sounds, no liver or spleen tip palpated on exam, no fluid wave  MSK no focal spinal tenderness, no joint edema Neuro: non-focal, well-oriented, appropriate affect Breasts: Deferred   Lab Results  Component Value Date   WBC 6.1 04/30/2020   HGB 12.6 (L) 04/30/2020   HCT 39.2 04/30/2020   MCV 97.5 04/30/2020   PLT 136 (L) 04/30/2020   Lab Results  Component Value Date   FERRITIN 322 02/01/2020   IRON 77 02/01/2020   TIBC 283 02/01/2020   UIBC 205 02/01/2020   IRONPCTSAT 27 02/01/2020   Lab Results  Component Value Date   RBC 4.02 (L) 04/30/2020   No results found for: KPAFRELGTCHN, LAMBDASER, KAPLAMBRATIO No results found for: IGGSERUM, IGA, IGMSERUM No results found for: Odetta Pink, SPEI   Chemistry      Component Value Date/Time   NA 138 04/09/2020 1533   NA 138 09/15/2018 0934   NA 144 10/04/2017 0904   NA 141 07/23/2016 1255   K 4.1 04/09/2020 1533   K 3.8 10/04/2017 0904   K 4.1 07/23/2016 1255   CL 102 04/09/2020 1533   CL 103 10/04/2017 0904   CO2 26 04/09/2020 1533   CO2 28 10/04/2017 0904   CO2 27 07/23/2016 1255   BUN 15 04/09/2020 1533   BUN 14 09/15/2018 0934   BUN 14 10/04/2017 0904   BUN 13.8 07/23/2016 1255   CREATININE  0.93 04/09/2020 1533   CREATININE 1.05 04/02/2020 0828   CREATININE 0.8 10/04/2017 0904   CREATININE 1.0 07/23/2016 1255      Component Value Date/Time   CALCIUM 9.0 04/09/2020 1533   CALCIUM 9.4 10/04/2017 0904   CALCIUM 9.2 07/23/2016 1255   ALKPHOS 61 04/02/2020 0828   ALKPHOS 64 10/04/2017 0904   ALKPHOS 77 07/23/2016 1255   AST 22 04/02/2020 0828   AST 23 07/23/2016 1255   ALT 17 04/02/2020 0828   ALT 22 10/04/2017 0904   ALT 13 07/23/2016 1255   BILITOT 0.6 04/02/2020 0828   BILITOT 1.13 07/23/2016 1255       Impression and Plan: Mr. Siddiqi is a  very pleasant 74 yo caucasian gentleman with metastatic low-grade neuroendocrine carcinoma, hepatic metastases. We will proceed with hi Somatuline injection today as planned and see him again in a month.  He will contact our office with any questions or concerns. We can certainly see him sooner if needed.   Laverna Peace, NP 6/30/20219:43 AM

## 2020-05-01 LAB — CHROMOGRANIN A: Chromogranin A (ng/mL): 142.7 ng/mL — ABNORMAL HIGH (ref 0.0–101.8)

## 2020-05-12 ENCOUNTER — Other Ambulatory Visit: Payer: Self-pay | Admitting: Family Medicine

## 2020-05-12 DIAGNOSIS — E039 Hypothyroidism, unspecified: Secondary | ICD-10-CM

## 2020-05-30 ENCOUNTER — Encounter: Payer: Self-pay | Admitting: Family

## 2020-05-30 ENCOUNTER — Other Ambulatory Visit: Payer: Self-pay

## 2020-05-30 ENCOUNTER — Inpatient Hospital Stay: Payer: Medicare HMO

## 2020-05-30 ENCOUNTER — Inpatient Hospital Stay: Payer: Medicare HMO | Attending: Hematology & Oncology | Admitting: Family

## 2020-05-30 VITALS — BP 136/62 | HR 64 | Temp 98.2°F | Resp 17 | Ht 70.5 in | Wt 206.1 lb

## 2020-05-30 DIAGNOSIS — C7A8 Other malignant neuroendocrine tumors: Secondary | ICD-10-CM | POA: Diagnosis present

## 2020-05-30 DIAGNOSIS — D500 Iron deficiency anemia secondary to blood loss (chronic): Secondary | ICD-10-CM | POA: Diagnosis not present

## 2020-05-30 DIAGNOSIS — C7B8 Other secondary neuroendocrine tumors: Secondary | ICD-10-CM

## 2020-05-30 LAB — CBC WITH DIFFERENTIAL (CANCER CENTER ONLY)
Abs Immature Granulocytes: 0.01 10*3/uL (ref 0.00–0.07)
Basophils Absolute: 0 10*3/uL (ref 0.0–0.1)
Basophils Relative: 1 %
Eosinophils Absolute: 0.2 10*3/uL (ref 0.0–0.5)
Eosinophils Relative: 3 %
HCT: 39.7 % (ref 39.0–52.0)
Hemoglobin: 12.8 g/dL — ABNORMAL LOW (ref 13.0–17.0)
Immature Granulocytes: 0 %
Lymphocytes Relative: 13 %
Lymphs Abs: 0.9 10*3/uL (ref 0.7–4.0)
MCH: 30.7 pg (ref 26.0–34.0)
MCHC: 32.2 g/dL (ref 30.0–36.0)
MCV: 95.2 fL (ref 80.0–100.0)
Monocytes Absolute: 0.4 10*3/uL (ref 0.1–1.0)
Monocytes Relative: 6 %
Neutro Abs: 5.3 10*3/uL (ref 1.7–7.7)
Neutrophils Relative %: 77 %
Platelet Count: 141 10*3/uL — ABNORMAL LOW (ref 150–400)
RBC: 4.17 MIL/uL — ABNORMAL LOW (ref 4.22–5.81)
RDW: 13.3 % (ref 11.5–15.5)
WBC Count: 6.9 10*3/uL (ref 4.0–10.5)
nRBC: 0 % (ref 0.0–0.2)

## 2020-05-30 LAB — CMP (CANCER CENTER ONLY)
ALT: 15 U/L (ref 0–44)
AST: 23 U/L (ref 15–41)
Albumin: 4.3 g/dL (ref 3.5–5.0)
Alkaline Phosphatase: 52 U/L (ref 38–126)
Anion gap: 7 (ref 5–15)
BUN: 20 mg/dL (ref 8–23)
CO2: 30 mmol/L (ref 22–32)
Calcium: 9.8 mg/dL (ref 8.9–10.3)
Chloride: 103 mmol/L (ref 98–111)
Creatinine: 1.08 mg/dL (ref 0.61–1.24)
GFR, Est AFR Am: >60 mL/min (ref >60)
GFR, Estimated: >60 mL/min (ref >60)
Glucose, Bld: 114 mg/dL — ABNORMAL HIGH (ref 70–99)
Potassium: 4.6 mmol/L (ref 3.5–5.1)
Sodium: 140 mmol/L (ref 135–145)
Total Bilirubin: 0.9 mg/dL (ref 0.3–1.2)
Total Protein: 7.4 g/dL (ref 6.5–8.1)

## 2020-05-30 LAB — LACTATE DEHYDROGENASE: LDH: 207 U/L — ABNORMAL HIGH (ref 98–192)

## 2020-05-30 MED ORDER — LANREOTIDE ACETATE 120 MG/0.5ML ~~LOC~~ SOLN
120.0000 mg | Freq: Once | SUBCUTANEOUS | Status: AC
  Administered 2020-05-30: 120 mg via SUBCUTANEOUS

## 2020-05-30 MED ORDER — LANREOTIDE ACETATE 120 MG/0.5ML ~~LOC~~ SOLN
SUBCUTANEOUS | Status: AC
  Filled 2020-05-30: qty 120

## 2020-05-30 NOTE — Progress Notes (Signed)
Hematology and Oncology Follow Up Visit  Brian Mays [id number] 1946/01/22 73 y.o. 05/30/2020   Principle Diagnosis:  Metastatic low grade neuroendocrine tumor-hepatic metastases Iron deficiency anemia  Past Therapy: S/p yttrium-90 intrahepatic therapy - November 2016 Lutathera (Lu 177) injection on 02/01/2018 - s/p cycle 4  Current Therapy: Somatuline 120 mg monthly IV Iron as indicated    Interim History:  Brian Mays is here today for follow-up and treatment. He continues to do well and has no complaints. He is planning a golfing trip up in the [place] with a close friend. They are hoping to go in August.  With the heat, he has only been golfing once a week lately. No c/o fatigue at this time.  His Chromogranin A level in June was 142.  No fever, chills, n/v, cough, rash, dizziness, SOB, chest pain, palpitations, abdominal pain or changes in bowel or bladder habits.  No swelling, tenderness, numbness or tingling in his extremities at this time. He has occasional numbness in the top of his left foot near where the top of his shoe hits. This comes and goes.  No falls or syncopal episodes to report.  He is eating well and staying hydrated. His weight is stable.   ECOG Performance Status: 1 - Symptomatic but completely ambulatory  Medications:  Allergies as of 05/30/2020      Reactions   Iohexol Hives    Code: HIVES, Desc: PER [name] @ PRIMARY CARE, PT IS ALLERGIC TO CONTRAST DYE 10/02/08/RM  05/01/10...needs full premeds per our protocol w/ [facility]., Onset Date: [id number]      Medication List       Accurate as of May 30, 2020  9:45 AM. If you have any questions, ask your nurse or doctor.        aspirin EC 81 MG tablet Take 81 mg by mouth every morning.   atorvastatin 20 MG tablet Commonly known as: LIPITOR TAKE 1 TABLET (20 MG TOTAL) BY MOUTH DAILY AT 6 PM.   Cholecalciferol 25 MCG (1000 UT) tablet Take 1,000 Units by mouth daily.   clopidogrel 75 MG  tablet Commonly known as: PLAVIX TAKE 1 TABLET BY MOUTH EVERY DAY   fluticasone 50 MCG/ACT nasal spray Commonly known as: FLONASE Place 2 sprays into both nostrils daily as needed (sinuses).   hydrocortisone 25 MG suppository Commonly known as: ANUSOL-HC INSERT 1 SUPPOSITORY RECTALLY TWICE A DAY AS NEEDED FOR HEMORRHOIDS   ipratropium 0.03 % nasal spray Commonly known as: Atrovent Place 2 sprays into the nose 3 (three) times daily.   lansoprazole 30 MG capsule Commonly known as: PREVACID Take 1 capsule (30 mg total) by mouth daily at 12 noon.   levothyroxine 50 MCG tablet Commonly known as: SYNTHROID Take 1 tablet (50 mcg total) by mouth daily before breakfast.   losartan 100 MG tablet Commonly known as: COZAAR TAKE 1 TABLET BY MOUTH EVERY DAY   metoprolol tartrate 25 MG tablet Commonly known as: LOPRESSOR TAKE 0.5 TABLETS (12.5 MG TOTAL) BY MOUTH 2 (TWO) TIMES DAILY.   nitroGLYCERIN 0.4 MG SL tablet Commonly known as: NITROSTAT PLACE 1 TABLET UNDER THE TONGUE EVERY 5 (FIVE) MINUTES X 3 DOSES AS NEEDED FOR CHEST PAIN.   predniSONE 50 MG tablet Commonly known as: DELTASONE Take orally 13 hours, 7 hours and 1 hour prior to CT scan contrast   sodium chloride 0.65 % Soln nasal spray Commonly known as: OCEAN Place 1 spray into both nostrils as needed for congestion.   SOMATULINE DEPOT  [place] Inject 120 mcg into the skin every 28 (twenty-eight) days. Receives at Dr Antonieta Pert office   vitamin B-12 1000 MCG tablet Commonly known as: CYANOCOBALAMIN Take 1,000 mcg by mouth every evening.       Allergies:  Allergies  Allergen Reactions  . Iohexol Hives     Code: HIVES, Desc: PER [name] @ [facility]IMARY [facility]ARE, PT IS ALLERGIC TO CONTRAST DYE 10/02/08/RM  05/01/10...needs full premeds per our protocol w/ [facility]., Onset Date: [id number]     Past Medical History, Surgical history, Social history, and Family History were reviewed and updated.  Review of Systems: All other 10 point  review of systems is negative.   Physical Exam:  height is 5' 10.5" (1.791 m) and weight is 206 lb 1.3 oz (93.5 kg) (abnormal). His oral temperature is 98.2 F (36.8 C). His blood pressure is 136/62 (abnormal) and his pulse is 64. His respiration is 17 and oxygen saturation is 100%.   Wt Readings from Last 3 Encounters:  05/30/20 (!) 206 lb 1.3 oz (93.5 kg)  04/30/20 210 lb (95.3 kg)  04/22/20 208 lb 6.4 oz (94.5 kg)    Ocular: Sclerae unicteric, pupils equal, round and reactive to light Ear-nose-throat: Oropharynx clear, dentition fair Lymphatic: No cervical or supraclavicular adenopathy Lungs no rales or rhonchi, good excursion bilaterally Heart regular rate and rhythm, no murmur appreciated Abd soft, nontender, positive bowel sounds, no liver or spleen tip palpated on exam, no fluid wave  MSK no focal spinal tenderness, no joint edema Neuro: non-focal, well-oriented, appropriate affect Breasts: Deferred   Lab Results  Component Value Date   WBC 6.9 05/30/2020   HGB 12.8 (L) 05/30/2020   HCT 39.7 05/30/2020   MCV 95.2 05/30/2020   PLT 141 (L) 05/30/2020   Lab Results  Component Value Date   FERRITIN 210 04/30/2020   IRON 65 04/30/2020   TIBC 278 04/30/2020   UIBC 213 04/30/2020   IRONPCTSAT 23 04/30/2020   Lab Results  Component Value Date   RBC 4.17 (L) 05/30/2020   No results found for: KPAFRELGTCHN, LAMBDASER, KAPLAMBRATIO No results found for: IGGSERUM, IGA, IGMSERUM No results found for: Odetta Pink, SPEI   Chemistry      Component Value Date/Time   NA 140 04/30/2020 0914   NA 138 09/15/2018 0934   NA 144 10/04/2017 0904   NA 141 07/23/2016 1255   K 4.3 04/30/2020 0914   K 3.8 10/04/2017 0904   K 4.1 07/23/2016 1255   CL 106 04/30/2020 0914   CL 103 10/04/2017 0904   CO2 28 04/30/2020 0914   CO2 28 10/04/2017 0904   CO2 27 07/23/2016 1255   BUN 12 04/30/2020 0914   BUN 14 09/15/2018 0934   BUN  14 10/04/2017 0904   BUN 13.8 07/23/2016 1255   CREATININE 0.94 04/30/2020 0914   CREATININE 0.8 10/04/2017 0904   CREATININE 1.0 07/23/2016 1255      Component Value Date/Time   CALCIUM 9.3 04/30/2020 0914   CALCIUM 9.4 10/04/2017 0904   CALCIUM 9.2 07/23/2016 1255   ALKPHOS 53 04/30/2020 0914   ALKPHOS 64 10/04/2017 0904   ALKPHOS 77 07/23/2016 1255   AST 22 04/30/2020 0914   AST 23 07/23/2016 1255   ALT 16 04/30/2020 0914   ALT 22 10/04/2017 0904   ALT 13 07/23/2016 1255   BILITOT 0.6 04/30/2020 0914   BILITOT 1.13 07/23/2016 1255       Impression and Plan: Mr.  Shughart is a very pleasant 74 yo caucasian gentleman with metastatic low-grade neuroendocrine carcinoma, hepatic metastases. He continues to do well and has no complaints at this time.  We will proceed with Samatuline today as planned and continue to follow his Chromogranin A with each visit.  We will see him again in another month.  He can contact our office with any questions or concerns. We can certainly see him sooner if needed.   Laverna Peace, NP 7/30/20219:45 AM

## 2020-05-30 NOTE — Patient Instructions (Signed)
Lanreotide injection What is this medicine? LANREOTIDE (lan REE oh tide) is used to reduce blood levels of growth hormone in patients with a condition called acromegaly. It also works to slow or stop tumor growth in patients with neuroendocrine tumors and treat carcinoid syndrome. This medicine may be used for other purposes; ask your health care provider or pharmacist if you have questions. COMMON BRAND NAME(S): Somatuline [place] What should I tell my health care provider before I take this medicine? They need to know if you have any of these conditions:  diabetes  gallbladder disease  heart disease  kidney disease  liver disease  thyroid disease  an unusual or allergic reaction to lanreotide, other medicines, foods, dyes, or preservatives  pregnant or trying to get pregnant  breast-feeding How should I use this medicine? This medicine is for injection under the skin. It is given by a health care professional in a hospital or clinic setting. Contact your pediatrician or health care professional regarding the use of this medicine in children. Special care may be needed. Overdosage: If you think you have taken too much of this medicine contact a poison control center or emergency room at once. NOTE: This medicine is only for you. Do not share this medicine with others. What if I miss a dose? It is important not to miss your dose. Call your doctor or health care professional if you are unable to keep an appointment. What may interact with this medicine? This medicine may interact with the following medications:  bromocriptine  cyclosporine  certain medicines for blood pressure, heart disease, irregular heart beat  certain medicines for diabetes  quinidine  terfenadine This list may not describe all possible interactions. Give your health care provider a list of all the medicines, herbs, non-prescription drugs, or dietary supplements you use. Also tell them if you smoke,  drink alcohol, or use illegal drugs. Some items may interact with your medicine. What should I watch for while using this medicine? Tell your doctor or healthcare professional if your symptoms do not start to get better or if they get worse. Visit your doctor or health care professional for regular checks on your progress. Your condition will be monitored carefully while you are receiving this medicine. This medicine may increase blood sugar. Ask your healthcare provider if changes in diet or medicines are needed if you have diabetes. You may need blood work done while you are taking this medicine. Women should inform their doctor if they wish to become pregnant or think they might be pregnant. There is a potential for serious side effects to an unborn child. Talk to your health care professional or pharmacist for more information. Do not breast-feed an infant while taking this medicine or for 6 months after stopping it. This medicine has caused ovarian failure in some women. This medicine may interfere with the ability to have a child. Talk with your doctor or health care professional if you are concerned about your fertility. What side effects may I notice from receiving this medicine? Side effects that you should report to your doctor or health care professional as soon as possible:  allergic reactions like skin rash, itching or hives, swelling of the face, lips, or tongue  increased blood pressure  severe stomach pain  signs and symptoms of hgh blood sugar such as being more thirsty or hungry or having to urinate more than normal. You may also feel very tired or have blurry vision.  signs and symptoms of low blood   sugar such as feeling anxious; confusion; dizziness; increased hunger; unusually weak or tired; sweating; shakiness; cold; irritable; headache; blurred vision; fast heartbeat; loss of consciousness  unusually slow heartbeat Side effects that usually do not require medical  attention (report to your doctor or health care professional if they continue or are bothersome):  constipation  diarrhea  dizziness  headache  muscle pain  muscle spasms  nausea  pain, redness, or irritation at site where injected This list may not describe all possible side effects. Call your doctor for medical advice about side effects. You may report side effects to FDA at 1-800-FDA-1088. Where should I keep my medicine? This drug is given in a hospital or clinic and will not be stored at home. NOTE: This sheet is a summary. It may not cover all possible information. If you have questions about this medicine, talk to your doctor, pharmacist, or health care provider.  2020 Elsevier/Gold Standard (2018-07-27 09:13:08)  

## 2020-06-04 LAB — CHROMOGRANIN A: Chromogranin A (ng/mL): 122.7 ng/mL — ABNORMAL HIGH (ref 0.0–101.8)

## 2020-06-05 ENCOUNTER — Other Ambulatory Visit: Payer: Self-pay | Admitting: Family Medicine

## 2020-06-05 DIAGNOSIS — E039 Hypothyroidism, unspecified: Secondary | ICD-10-CM

## 2020-06-06 ENCOUNTER — Encounter: Payer: Self-pay | Admitting: Family Medicine

## 2020-06-12 ENCOUNTER — Encounter: Payer: Self-pay | Admitting: Hematology & Oncology

## 2020-06-26 ENCOUNTER — Ambulatory Visit: Payer: Medicare HMO

## 2020-06-26 ENCOUNTER — Other Ambulatory Visit: Payer: Medicare HMO

## 2020-06-26 ENCOUNTER — Ambulatory Visit: Payer: Medicare HMO | Admitting: Hematology & Oncology

## 2020-07-04 ENCOUNTER — Inpatient Hospital Stay: Payer: Medicare HMO | Admitting: Hematology & Oncology

## 2020-07-04 ENCOUNTER — Inpatient Hospital Stay: Payer: Medicare HMO

## 2020-07-04 ENCOUNTER — Inpatient Hospital Stay: Payer: Medicare HMO | Attending: Hematology & Oncology

## 2020-07-04 ENCOUNTER — Encounter: Payer: Self-pay | Admitting: Hematology & Oncology

## 2020-07-04 ENCOUNTER — Other Ambulatory Visit: Payer: Self-pay

## 2020-07-04 VITALS — BP 202/73 | HR 71 | Temp 98.1°F | Resp 20 | Wt 205.1 lb

## 2020-07-04 DIAGNOSIS — C7B8 Other secondary neuroendocrine tumors: Secondary | ICD-10-CM | POA: Diagnosis not present

## 2020-07-04 DIAGNOSIS — D509 Iron deficiency anemia, unspecified: Secondary | ICD-10-CM | POA: Insufficient documentation

## 2020-07-04 DIAGNOSIS — C7A8 Other malignant neuroendocrine tumors: Secondary | ICD-10-CM | POA: Insufficient documentation

## 2020-07-04 DIAGNOSIS — D500 Iron deficiency anemia secondary to blood loss (chronic): Secondary | ICD-10-CM

## 2020-07-04 LAB — CMP (CANCER CENTER ONLY)
ALT: 20 U/L (ref 0–44)
AST: 24 U/L (ref 15–41)
Albumin: 4 g/dL (ref 3.5–5.0)
Alkaline Phosphatase: 54 U/L (ref 38–126)
Anion gap: 6 (ref 5–15)
BUN: 19 mg/dL (ref 8–23)
CO2: 32 mmol/L (ref 22–32)
Calcium: 9.7 mg/dL (ref 8.9–10.3)
Chloride: 102 mmol/L (ref 98–111)
Creatinine: 0.97 mg/dL (ref 0.61–1.24)
GFR, Est AFR Am: >60 mL/min (ref >60)
GFR, Estimated: >60 mL/min (ref >60)
Glucose, Bld: 116 mg/dL — ABNORMAL HIGH (ref 70–99)
Potassium: 4.5 mmol/L (ref 3.5–5.1)
Sodium: 140 mmol/L (ref 135–145)
Total Bilirubin: 0.8 mg/dL (ref 0.3–1.2)
Total Protein: 7.5 g/dL (ref 6.5–8.1)

## 2020-07-04 LAB — CBC WITH DIFFERENTIAL (CANCER CENTER ONLY)
Abs Immature Granulocytes: 0.03 10*3/uL (ref 0.00–0.07)
Basophils Absolute: 0 10*3/uL (ref 0.0–0.1)
Basophils Relative: 0 %
Eosinophils Absolute: 0.2 10*3/uL (ref 0.0–0.5)
Eosinophils Relative: 3 %
HCT: 39.1 % (ref 39.0–52.0)
Hemoglobin: 12.7 g/dL — ABNORMAL LOW (ref 13.0–17.0)
Immature Granulocytes: 0 %
Lymphocytes Relative: 16 %
Lymphs Abs: 1.1 10*3/uL (ref 0.7–4.0)
MCH: 30.8 pg (ref 26.0–34.0)
MCHC: 32.5 g/dL (ref 30.0–36.0)
MCV: 94.9 fL (ref 80.0–100.0)
Monocytes Absolute: 0.4 10*3/uL (ref 0.1–1.0)
Monocytes Relative: 6 %
Neutro Abs: 5.1 10*3/uL (ref 1.7–7.7)
Neutrophils Relative %: 75 %
Platelet Count: 151 10*3/uL (ref 150–400)
RBC: 4.12 MIL/uL — ABNORMAL LOW (ref 4.22–5.81)
RDW: 13.2 % (ref 11.5–15.5)
WBC Count: 6.9 10*3/uL (ref 4.0–10.5)
nRBC: 0 % (ref 0.0–0.2)

## 2020-07-04 MED ORDER — LANREOTIDE ACETATE 120 MG/0.5ML ~~LOC~~ SOLN
120.0000 mg | Freq: Once | SUBCUTANEOUS | Status: AC
  Administered 2020-07-04: 120 mg via SUBCUTANEOUS

## 2020-07-04 MED ORDER — LANREOTIDE ACETATE 120 MG/0.5ML ~~LOC~~ SOLN
SUBCUTANEOUS | Status: AC
  Filled 2020-07-04: qty 120

## 2020-07-04 NOTE — Progress Notes (Signed)
Hematology and Oncology Follow Up Visit  Brian Mays [id number] 1946/06/18 74 y.o. 07/04/2020   Principle Diagnosis:  Metastatic low grade neuroendocrine tumor-hepatic metastases Iron deficiency anemia  Past Therapy: S/p yttrium-90 intrahepatic therapy - November 2016 Lutathera (Lu 177) injection on 02/01/2018 - s/p cycle 4  Current Therapy: Somatuline 120 mg monthly IV Iron as indicated    Interim History:  Brian Mays is here today for follow-up and treatment.  Overall, he is doing quite well.  Unfortunately, as his wife is having the difficulties.  She is having trouble with breathing.  She is seeing pulmonary medicine for her pulmonary issues.  He has had no problems with diarrhea.  His blood pressure is up today.  This is unusual for him.  He had his blood pressure under very good control.  He will speak to his cardiologist about this.  His last chromogranin A level was 122.  This is holding relatively stable.  He has had no problems with pain.  He has had no headache.  Is had no issues with weight loss or weight gain.  He has had no leg swelling.  There is been no change in bowel or bladder habits.  Overall, his performance status is ECOG 1.   Medications:  Allergies as of 07/04/2020      Reactions   Iohexol Hives    Code: HIVES, Desc: PER [name] @ PRIMARY CARE, PT IS ALLERGIC TO CONTRAST DYE 10/02/08/RM  05/01/10...needs full premeds per our protocol w/ [facility]., Onset Date: [id number]      Medication List       Accurate as of July 04, 2020 12:42 PM. If you have any questions, ask your nurse or doctor.        STOP taking these medications   predniSONE 50 MG tablet Commonly known as: DELTASONE Stopped by: Volanda Napoleon, MD     TAKE these medications   aspirin EC 81 MG tablet Take 81 mg by mouth every morning.   atorvastatin 20 MG tablet Commonly known as: LIPITOR TAKE 1 TABLET (20 MG TOTAL) BY MOUTH DAILY AT 6 PM.   Cholecalciferol 25 MCG  (1000 UT) tablet Take 1,000 Units by mouth daily.   clopidogrel 75 MG tablet Commonly known as: PLAVIX TAKE 1 TABLET BY MOUTH EVERY DAY   fluticasone 50 MCG/ACT nasal spray Commonly known as: FLONASE Place 2 sprays into both nostrils daily as needed (sinuses).   hydrochlorothiazide 25 MG tablet Commonly known as: HYDRODIURIL Take 12.5 mg by mouth every morning.   hydrocortisone 25 MG suppository Commonly known as: ANUSOL-HC INSERT 1 SUPPOSITORY RECTALLY TWICE A DAY AS NEEDED FOR HEMORRHOIDS   ipratropium 0.03 % nasal spray Commonly known as: Atrovent Place 2 sprays into the nose 3 (three) times daily.   lansoprazole 30 MG capsule Commonly known as: PREVACID Take 1 capsule (30 mg total) by mouth daily at 12 noon.   levothyroxine 50 MCG tablet Commonly known as: SYNTHROID Take 1 tablet (50 mcg total) by mouth daily before breakfast.   losartan 100 MG tablet Commonly known as: COZAAR TAKE 1 TABLET BY MOUTH EVERY DAY   metoprolol tartrate 25 MG tablet Commonly known as: LOPRESSOR TAKE 0.5 TABLETS (12.5 MG TOTAL) BY MOUTH 2 (TWO) TIMES DAILY.   nitroGLYCERIN 0.4 MG SL tablet Commonly known as: NITROSTAT PLACE 1 TABLET UNDER THE TONGUE EVERY 5 (FIVE) MINUTES X 3 DOSES AS NEEDED FOR CHEST PAIN.   sodium chloride 0.65 % Soln nasal spray Commonly known as: OCEAN  Place 1 spray into both nostrils as needed for congestion.   SOMATULINE DEPOT Brian Mays Inject 120 mcg into the skin every 28 (twenty-eight) days. Receives at Dr Brian Mays office   vitamin B-12 1000 MCG tablet Commonly known as: CYANOCOBALAMIN Take 1,000 mcg by mouth every evening.       Allergies:  Allergies  Allergen Reactions  . Iohexol Hives     Code: HIVES, Desc: PER [name] @ PRIMARY CARE, PT IS ALLERGIC TO CONTRAST DYE 10/02/08/RM  05/01/10...needs full premeds per our protocol w/ [facility]., Onset Date: [id number]     Past Medical History, Surgical history, Social history, and Family History were reviewed  and updated.  Review of Systems: Review of Systems  Constitutional: Negative.   HENT: Negative.   Eyes: Negative.   Respiratory: Negative.   Cardiovascular: Negative.   Gastrointestinal: Negative.   Genitourinary: Negative.   Musculoskeletal: Negative.   Skin: Negative.   Neurological: Negative.   Endo/Heme/Allergies: Negative.   Psychiatric/Behavioral: Negative.      Physical Exam:  weight is 205 lb 1.9 oz (93 kg). His oral temperature is 98.1 F (36.7 C). His blood pressure is 202/73 (abnormal) and his pulse is 71. His respiration is 20 and oxygen saturation is 100%.   Wt Readings from Last 3 Encounters:  07/04/20 205 lb 1.9 oz (93 kg)  05/30/20 (!) 206 lb 1.3 oz (93.5 kg)  04/30/20 210 lb (95.3 kg)    Physical Exam Vitals reviewed.  HENT:     Head: Normocephalic and atraumatic.  Eyes:     Pupils: Pupils are equal, round, and reactive to light.  Cardiovascular:     Rate and Rhythm: Normal rate and regular rhythm.     Heart sounds: Normal heart sounds.  Pulmonary:     Effort: Pulmonary effort is normal.     Breath sounds: Normal breath sounds.  Abdominal:     General: Bowel sounds are normal.     Palpations: Abdomen is soft.  Musculoskeletal:        General: No tenderness or deformity. Normal range of motion.     Cervical back: Normal range of motion.  Lymphadenopathy:     Cervical: No cervical adenopathy.  Skin:    General: Skin is warm and dry.     Findings: No erythema or rash.  Neurological:     Mental Status: He is alert and oriented to person, place, and time.  Psychiatric:        Behavior: Behavior normal.        Thought Content: Thought content normal.        Judgment: Judgment normal.      Lab Results  Component Value Date   WBC 6.9 07/04/2020   HGB 12.7 (L) 07/04/2020   HCT 39.1 07/04/2020   MCV 94.9 07/04/2020   PLT 151 07/04/2020   Lab Results  Component Value Date   FERRITIN 210 04/30/2020   IRON 65 04/30/2020   TIBC 278  04/30/2020   UIBC 213 04/30/2020   IRONPCTSAT 23 04/30/2020   Lab Results  Component Value Date   RBC 4.12 (L) 07/04/2020   No results found for: KPAFRELGTCHN, LAMBDASER, KAPLAMBRATIO No results found for: IGGSERUM, IGA, IGMSERUM No results found for: Odetta Pink, SPEI   Chemistry      Component Value Date/Time   NA 140 07/04/2020 1153   NA 138 09/15/2018 0934   NA 144 10/04/2017 0904   NA 141 07/23/2016 1255   K  4.5 07/04/2020 1153   K 3.8 10/04/2017 0904   K 4.1 07/23/2016 1255   CL 102 07/04/2020 1153   CL 103 10/04/2017 0904   CO2 32 07/04/2020 1153   CO2 28 10/04/2017 0904   CO2 27 07/23/2016 1255   BUN 19 07/04/2020 1153   BUN 14 09/15/2018 0934   BUN 14 10/04/2017 0904   BUN 13.8 07/23/2016 1255   CREATININE 0.97 07/04/2020 1153   CREATININE 0.8 10/04/2017 0904   CREATININE 1.0 07/23/2016 1255      Component Value Date/Time   CALCIUM 9.7 07/04/2020 1153   CALCIUM 9.4 10/04/2017 0904   CALCIUM 9.2 07/23/2016 1255   ALKPHOS 54 07/04/2020 1153   ALKPHOS 64 10/04/2017 0904   ALKPHOS 77 07/23/2016 1255   AST 24 07/04/2020 1153   AST 23 07/23/2016 1255   ALT 20 07/04/2020 1153   ALT 22 10/04/2017 0904   ALT 13 07/23/2016 1255   BILITOT 0.8 07/04/2020 1153   BILITOT 1.13 07/23/2016 1255       Impression and Plan: Mr. Radoncic is a very pleasant 74 yo caucasian gentleman with metastatic low-grade neuroendocrine carcinoma.  He has hepatic metastasis.  He has been through [employer] treatment.  He had relatively stable disease with this.  He currently is on Somatuline monthly.  We really need to get another dotatate scan on him.  This really tells [place] how things are going with respect to responses.  We find that he is progressing, then we will increase the Somatuline to every 2-week dosing.  I still want to hold off on using chemotherapy.  For right now, I will plan to get him back in another month.     He  will speak to his cardiologist about the high blood pressure.  Volanda Napoleon, MD 9/3/202112:42 PM

## 2020-07-04 NOTE — Patient Instructions (Signed)
Lanreotide injection What is this medicine? LANREOTIDE (lan REE oh tide) is used to reduce blood levels of growth hormone in patients with a condition called acromegaly. It also works to slow or stop tumor growth in patients with neuroendocrine tumors and treat carcinoid syndrome. This medicine may be used for other purposes; ask your health care provider or pharmacist if you have questions. COMMON BRAND NAME(S): Somatuline [place] What should I tell my health care provider before I take this medicine? They need to know if you have any of these conditions:  diabetes  gallbladder disease  heart disease  kidney disease  liver disease  thyroid disease  an unusual or allergic reaction to lanreotide, other medicines, foods, dyes, or preservatives  pregnant or trying to get pregnant  breast-feeding How should I use this medicine? This medicine is for injection under the skin. It is given by a health care professional in a hospital or clinic setting. Contact your pediatrician or health care professional regarding the use of this medicine in children. Special care may be needed. Overdosage: If you think you have taken too much of this medicine contact a poison control center or emergency room at once. NOTE: This medicine is only for you. Do not share this medicine with others. What if I miss a dose? It is important not to miss your dose. Call your doctor or health care professional if you are unable to keep an appointment. What may interact with this medicine? This medicine may interact with the following medications:  bromocriptine  cyclosporine  certain medicines for blood pressure, heart disease, irregular heart beat  certain medicines for diabetes  quinidine  terfenadine This list may not describe all possible interactions. Give your health care provider a list of all the medicines, herbs, non-prescription drugs, or dietary supplements you use. Also tell them if you smoke,  drink alcohol, or use illegal drugs. Some items may interact with your medicine. What should I watch for while using this medicine? Tell your doctor or healthcare professional if your symptoms do not start to get better or if they get worse. Visit your doctor or health care professional for regular checks on your progress. Your condition will be monitored carefully while you are receiving this medicine. This medicine may increase blood sugar. Ask your healthcare provider if changes in diet or medicines are needed if you have diabetes. You may need blood work done while you are taking this medicine. Women should inform their doctor if they wish to become pregnant or think they might be pregnant. There is a potential for serious side effects to an unborn child. Talk to your health care professional or pharmacist for more information. Do not breast-feed an infant while taking this medicine or for 6 months after stopping it. This medicine has caused ovarian failure in some women. This medicine may interfere with the ability to have a child. Talk with your doctor or health care professional if you are concerned about your fertility. What side effects may I notice from receiving this medicine? Side effects that you should report to your doctor or health care professional as soon as possible:  allergic reactions like skin rash, itching or hives, swelling of the face, lips, or tongue  increased blood pressure  severe stomach pain  signs and symptoms of hgh blood sugar such as being more thirsty or hungry or having to urinate more than normal. You may also feel very tired or have blurry vision.  signs and symptoms of low blood   sugar such as feeling anxious; confusion; dizziness; increased hunger; unusually weak or tired; sweating; shakiness; cold; irritable; headache; blurred vision; fast heartbeat; loss of consciousness  unusually slow heartbeat Side effects that usually do not require medical  attention (report to your doctor or health care professional if they continue or are bothersome):  constipation  diarrhea  dizziness  headache  muscle pain  muscle spasms  nausea  pain, redness, or irritation at site where injected This list may not describe all possible side effects. Call your doctor for medical advice about side effects. You may report side effects to FDA at 1-800-FDA-1088. Where should I keep my medicine? This drug is given in a hospital or clinic and will not be stored at home. NOTE: This sheet is a summary. It may not cover all possible information. If you have questions about this medicine, talk to your doctor, pharmacist, or health care provider.  2020 Elsevier/Gold Standard (2018-07-27 09:13:08)  

## 2020-07-08 LAB — LACTATE DEHYDROGENASE: LDH: 173 U/L (ref 98–192)

## 2020-07-08 LAB — CHROMOGRANIN A: Chromogranin A (ng/mL): 154 ng/mL — ABNORMAL HIGH (ref 0.0–101.8)

## 2020-07-09 NOTE — Patient Instructions (Addendum)
It was great to see you again today, I will be in touch with your labs as soon as possible  Please get your flu shot this fall, and covid booster 6-8 months after you finished your primary series    Health Maintenance After Age 73 After age 42, you are at a higher risk for certain long-term diseases and infections as well as injuries from falls. Falls are a major cause of broken bones and head injuries in people who are older than age 84. Getting regular preventive care can help to keep you healthy and well. Preventive care includes getting regular testing and making lifestyle changes as recommended by your health care provider. Talk with your health care provider about:  Which screenings and tests you should have. A screening is a test that checks for a disease when you have no symptoms.  A diet and exercise plan that is right for you. What should I know about screenings and tests to prevent falls? Screening and testing are the best ways to find a health problem early. Early diagnosis and treatment give you the best chance of managing medical conditions that are common after age 82. Certain conditions and lifestyle choices may make you more likely to have a fall. Your health care provider may recommend:  Regular vision checks. Poor vision and conditions such as cataracts can make you more likely to have a fall. If you wear glasses, make sure to get your prescription updated if your vision changes.  Medicine review. Work with your health care provider to regularly review all of the medicines you are taking, including over-the-counter medicines. Ask your health care provider about any side effects that may make you more likely to have a fall. Tell your health care provider if any medicines that you take make you feel dizzy or sleepy.  Osteoporosis screening. Osteoporosis is a condition that causes the bones to get weaker. This can make the bones weak and cause them to break more easily.  Blood  pressure screening. Blood pressure changes and medicines to control blood pressure can make you feel dizzy.  Strength and balance checks. Your health care provider may recommend certain tests to check your strength and balance while standing, walking, or changing positions.  Foot health exam. Foot pain and numbness, as well as not wearing proper footwear, can make you more likely to have a fall.  Depression screening. You may be more likely to have a fall if you have a fear of falling, feel emotionally low, or feel unable to do activities that you used to do.  Alcohol use screening. Using too much alcohol can affect your balance and may make you more likely to have a fall. What actions can I take to lower my risk of falls? General instructions  Talk with your health care provider about your risks for falling. Tell your health care provider if: ? You fall. Be sure to tell your health care provider about all falls, even ones that seem minor. ? You feel dizzy, sleepy, or off-balance.  Take over-the-counter and prescription medicines only as told by your health care provider. These include any supplements.  Eat a healthy diet and maintain a healthy weight. A healthy diet includes low-fat dairy products, low-fat (lean) meats, and fiber from whole grains, beans, and lots of fruits and vegetables. Home safety  Remove any tripping hazards, such as rugs, cords, and clutter.  Install safety equipment such as grab bars in bathrooms and safety rails on stairs.  Keep  rooms and walkways well-lit. Activity   Follow a regular exercise program to stay fit. This will help you maintain your balance. Ask your health care provider what types of exercise are appropriate for you.  If you need a cane or walker, use it as recommended by your health care provider.  Wear supportive shoes that have nonskid soles. Lifestyle  Do not drink alcohol if your health care provider tells you not to drink.  If you  drink alcohol, limit how much you have: ? 0-1 drink a day for women. ? 0-2 drinks a day for men.  Be aware of how much alcohol is in your drink. In the [place] one drink equals one typical bottle of beer (12 oz), one-half glass of wine (5 oz), or one shot of hard liquor (1 oz).  Do not use any products that contain nicotine or tobacco, such as cigarettes and e-cigarettes. If you need help quitting, ask your health care provider. Summary  Having a healthy lifestyle and getting preventive care can help to protect your health and wellness after age 6.  Screening and testing are the best way to find a health problem early and help you avoid having a fall. Early diagnosis and treatment give you the best chance for managing medical conditions that are more common for people who are older than age 56.  Falls are a major cause of broken bones and head injuries in people who are older than age 67. Take precautions to prevent a fall at home.  Work with your health care provider to learn what changes you can make to improve your health and wellness and to prevent falls. This information is not intended to replace advice given to you by your health care provider. Make sure you discuss any questions you have with your health care provider. Document Revised: 02/08/2019 Document Reviewed: 08/31/2017 Elsevier Patient Education  2020 [employer].

## 2020-07-09 NOTE — Progress Notes (Addendum)
Brian Mays at [facility], [place], [place] [address] [phone]  Date:  07/10/2020   Name:  Brian Mays   DOB:  January 23, 1946   MRN:  [id number]  PCP:  Darreld Mclean, MD    Chief Complaint: Annual Exam   History of Present Illness:  Brian Mays is a 74 y.o. very pleasant male patient who presents with the following:  Brian Mays is here today for periodic follow-up visit/physical exam Last seen by myself about 1 year ago He has history of metastatic malignant neuroendocrine tumor in the liver as well as iron absorption, CAD status post stenting in 2017, hypothyroidism, hypertension, chronic pancreatitis  His oncologist is Dr. Marin Olp, most recent visit earlier this month: Principle Diagnosis:  Metastatic low grade neuroendocrine tumor-hepatic metastases Iron deficiencyanemia Past Therapy: S/p yttrium-90 intrahepatic therapy - November 2016 Lutathera (Lu 177) injection on 02/01/2018 - s/p cycle 4 Current Therapy: Somatuline 120 mg monthly IV Ironas indicated  He is generally feeling well, his energy level is not 100% but pretty good He has also been seen by urology to discuss microscopic hematuria Cardiologist is Sherilyn Banker recent visit October.  His blood pressure was noted to be under okay control, no changes made  Colon cancer screening up-to-date COVID-19 series completed Shingrix Can offer dose of Pneumovax- would like to do today  Recent CMP, CBC on chart PSA 1 year ago TSH also 1 year ago  Aspirin 81 Lipitor B12 Plavix Synthroid 50 Losartan 100 Lopressor 12.5 twice daily He is taking hctz 12.5 as well right now Patient Active Problem List   Diagnosis Date Noted  . Near syncope 04/22/2020  . Iron deficiency anemia due to chronic blood loss 03/29/2019  . 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 Lumbar radiculopathy 08/16/2017  . CAD (coronary artery disease) 09/21/2016  . Unstable  angina ([place]) 02/09/2016  . Vitamin D deficiency 03/11/2015  . Metastatic malignant neuroendocrine tumor to liver ([place]) 02/14/2015  . Hyperglycemia 04/03/2012  . Diarrhea 03/29/2012  . B12 deficiency 04/19/2011  . TOBACCO USE, QUIT 10/13/2009  . Unspecified vitamin D deficiency 06/19/2009  . Neoplasm by body site 12/23/2008  . DIVERTICULOSIS, COLON 11/08/2008  . ABDOMINAL PAIN 10/02/2008  . Hypothyroidism 12/13/2007  . HEMORRHOIDS, NOS 12/13/2007  . COLONIC POLYPS, HX OF 12/13/2007  . Essential hypertension 09/15/2007  . GERD 09/15/2007  . PANCREATITIS, CHRONIC 09/15/2007  . CARDIAC MURMUR 09/15/2007  . SNORING 09/15/2007    Past Medical History:  Diagnosis Date  . Anemia    in past  . Colon polyps   . Diverticulosis   . Gallstones   . GERD (gastroesophageal reflux disease)   . Heart attack ([place])    mild, Spring 2017  . Heart murmur   . Hemorrhoid   . Hiatal hernia   . HTN (hypertension)   . Hypothyroidism   . Iron deficiency anemia due to chronic blood loss 03/29/2019  . 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 Metastatic carcinoma ([place]) 2010   Dr Jonette Eva  . Pancreatitis 1998   chronic    Past Surgical History:  Procedure Laterality Date  . APPENDECTOMY  1962  . CARDIAC CATHETERIZATION N/A 02/10/2016   Procedure: Left Heart Cath and Coronary Angiography;  Surgeon: Adrian Prows, MD;  Location: [facility];  Service: Cardiovascular;  Laterality: N/A;  . CARDIAC CATHETERIZATION  02/10/2016   Procedure: Coronary/Graft Atherectomy;  Surgeon: Adrian Prows, MD;  Location: [facility];  Service: Cardiovascular;;  . CARDIAC CATHETERIZATION  02/10/2016   Procedure: Coronary Stent Intervention;  Surgeon: Adrian Prows, MD;  Location: [facility];  Service: Cardiovascular;;  . CATARACT EXTRACTION W/ INTRAOCULAR LENS  IMPLANT, BILATERAL Bilateral   . IR GENERIC HISTORICAL  12/30/2015   IR RADIOLOGIST EVAL & MGMT 12/30/2015 Aletta Edouard, MD [facility] INTER[facility] RAD  . IR GENERIC  HISTORICAL  11/09/2016   IR RADIOLOGIST EVAL & MGMT 11/09/2016 Aletta Edouard, MD [facility] INTERV RAD  . IR RADIOLOGIST EVAL & MGMT  03/01/2017  . IR RADIOLOGIST EVAL & MGMT  06/29/2017  . LAPAROSCOPIC CHOLECYSTECTOMY  1999  . LIVER BIOPSY  2010  . RADIOACTIVE SEED IMPLANT  X 3   "to my liver"  . TUMOR EXCISION  01/2009   Carcinoil Resection   . TUMOR REMOVAL     from small intestine    Social History   Tobacco Use  . Smoking status: Former Smoker    Packs/day: 1.50    Years: 35.00    Pack years: 52.50    Types: Cigarettes    Start date: 09/25/1959    Quit date: 12/21/1997    Years since quitting: 22.5  . Smokeless tobacco: Never Used  Vaping Use  . Vaping Use: Never used  Substance Use Topics  . Alcohol use: Yes    Alcohol/week: 3.0 - 4.0 standard drinks    Types: 3 - 4 Cans of beer per week    Comment: 4 beers weekly   . Drug use: No    Family History  Problem Relation Age of Onset  . Kidney disease Mother   . Hyperlipidemia Mother   . Hypertension Mother   . COPD Father   . Ulcerative colitis Daughter     Allergies  Allergen Reactions  . Iohexol Hives     Code: HIVES, Desc: PER [name] @ PRIMARY CARE, PT IS ALLERGIC TO CONTRAST DYE 10/02/08/RM  05/01/10...needs full premeds per our protocol w/ [facility]., Onset Date: [id number]     Medication list has been reviewed and updated.  Current Outpatient Medications on File Prior to Visit  Medication Sig Dispense Refill  . aspirin EC 81 MG tablet Take 81 mg by mouth every morning.    [name] atorvastatin (LIPITOR) 20 MG tablet TAKE 1 TABLET (20 MG TOTAL) BY MOUTH DAILY AT 6 PM. 90 tablet 2  . Cholecalciferol 1000 UNITS tablet Take 1,000 Units by mouth daily.      . clopidogrel (PLAVIX) 75 MG tablet TAKE 1 TABLET BY MOUTH EVERY DAY 90 tablet 3  . fluticasone (FLONASE) 50 MCG/ACT nasal spray Place 2 sprays into both nostrils daily as needed (sinuses).   11  . hydrochlorothiazide (HYDRODIURIL) 25 MG tablet Take 12.5 mg by mouth daily.     . hydrocortisone (ANUSOL-HC) 25 MG suppository INSERT 1 SUPPOSITORY RECTALLY TWICE A DAY AS NEEDED FOR HEMORRHOIDS 30 suppository 0  . ipratropium (ATROVENT) 0.03 % nasal spray Place 2 sprays into the nose 3 (three) times daily. 30 mL 6  . Lanreotide Acetate (SOMATULINE DEPOT [place]) Inject 120 mcg into the skin every 28 (twenty-eight) days. Receives at Dr Antonieta Pert office    . lansoprazole (PREVACID) 30 MG capsule Take 1 capsule (30 mg total) by mouth daily at 12 noon. 90 capsule 3  . levothyroxine (SYNTHROID) 50 MCG tablet Take 1 tablet (50 mcg total) by mouth daily before breakfast. 30 tablet 0  . nitroGLYCERIN (NITROSTAT) 0.4 MG SL tablet PLACE 1 TABLET UNDER THE TONGUE EVERY 5 (  FIVE) MINUTES X 3 DOSES AS NEEDED FOR CHEST PAIN. 25 tablet 8  . sodium chloride (OCEAN) 0.65 % SOLN nasal spray Place 1 spray into both nostrils as needed for congestion.     . vitamin B-12 (CYANOCOBALAMIN) 1000 MCG tablet Take 1,000 mcg by mouth every evening.      No current facility-administered medications on file prior to visit.    Review of Systems:  As per HPI- otherwise negative.   Physical Examination: Vitals:   07/10/20 1048  BP: 122/68  Pulse: 68  SpO2: 98%   Vitals:   07/10/20 1048  Weight: 207 lb (93.9 kg)  Height: 5' 10.5" (1.791 m)   Body mass index is 29.28 kg/m. Ideal Body Weight: Weight in (lb) to have BMI = 25: 176.4  GEN: no acute distress.  Mild overweight, looks well  HEENT: Atraumatic, Normocephalic.   Bilateral TM wnl, oropharynx normal.  PEERL,EOMI.   Wearing hearing aids Ears and Nose: No external deformity. CV: RRR, No M/G/R. No JVD. No thrill. No extra heart sounds. PULM: CTA B, no wheezes, crackles, rhonchi. No retractions. No resp. distress. No accessory muscle use. ABD: S, NT, ND. No rebound. No HSM. EXTR: No c/c/e PSYCH: Normally interactive. Conversant.  Right thumb with wart- VC obtained LN x3 treatment to wart. Pt tolerated well no complications  Assessment  and Plan: Physical exam  Essential hypertension, benign - Plan: metoprolol tartrate (LOPRESSOR) 25 MG tablet, losartan (COZAAR) 100 MG tablet  Acquired hypothyroidism - Plan: TSH  Mixed hyperlipidemia - Plan: Lipid panel  Screening for prostate cancer - Plan: PSA  Vitamin D deficiency - Plan: VITAMIN D 25 Hydroxy (Vit-D Deficiency, Fractures)  B12 deficiency - Plan: Vitamin B12  Elevated glucose - Plan: Hemoglobin A1c  Immunization due - Plan: Pneumococcal polysaccharide vaccine 23-valent greater than or equal to 2yo subcutaneous/IM   Pt here today for a CPE Labs pending as above- Will plan further follow- up pending labs. He is golfing, walking some for exercise. Continue healthy diet and exercise Continue cancer therapy with DR Marin Olp Pneumovax today Refill thyroid once TSH in   This visit occurred during the SARS-CoV-2 public health emergency.  Safety protocols were in place, including screening questions prior to the visit, additional usage of staff PPE, and extensive cleaning of exam room while observing appropriate contact time as indicated for disinfecting solutions.    Signed Lamar Blinks, MD  Received his labs as below, 9/10.  Message to patient Refill thyroid medication Results for orders placed or performed in visit on 07/10/20  Hemoglobin A1c  Result Value Ref Range   Hgb A1c MFr Bld 5.9 (H) <5.7 % of total Hgb   Mean Plasma Glucose 123 (calc)   eAG (mmol/L) 6.8 (calc)  Lipid panel  Result Value Ref Range   Cholesterol 106 <200 mg/dL   HDL 37 (L) > OR = 40 mg/dL   Triglycerides 158 (H) <150 mg/dL   LDL Cholesterol (Calc) 45 mg/dL (calc)   Total CHOL/HDL Ratio 2.9 <5.0 (calc)   Non-HDL Cholesterol (Calc) 69 <130 mg/dL (calc)  VITAMIN D 25 Hydroxy (Vit-D Deficiency, Fractures)  Result Value Ref Range   Vit D, 25-Hydroxy 30 30 - 100 ng/mL  Vitamin B12  Result Value Ref Range   Vitamin B-12 1,148 (H) 200 - 1,100 pg/mL  TSH  Result Value Ref Range    TSH 4.00 0.40 - 4.50 mIU/L  PSA  Result Value Ref Range   PSA 0.4 < OR = 4.0 ng/mL  Lab Results  Component Value Date   PSA 0.4 07/10/2020   PSA 0.50 06/06/2019   PSA 0.34 08/14/2018

## 2020-07-10 ENCOUNTER — Ambulatory Visit (INDEPENDENT_AMBULATORY_CARE_PROVIDER_SITE_OTHER): Payer: Medicare HMO | Admitting: Family Medicine

## 2020-07-10 ENCOUNTER — Other Ambulatory Visit: Payer: Self-pay

## 2020-07-10 VITALS — BP 122/68 | HR 68 | Ht 70.5 in | Wt 207.0 lb

## 2020-07-10 DIAGNOSIS — E039 Hypothyroidism, unspecified: Secondary | ICD-10-CM | POA: Diagnosis not present

## 2020-07-10 DIAGNOSIS — Z0000 Encounter for general adult medical examination without abnormal findings: Secondary | ICD-10-CM | POA: Diagnosis not present

## 2020-07-10 DIAGNOSIS — E538 Deficiency of other specified B group vitamins: Secondary | ICD-10-CM

## 2020-07-10 DIAGNOSIS — Z125 Encounter for screening for malignant neoplasm of prostate: Secondary | ICD-10-CM

## 2020-07-10 DIAGNOSIS — R7309 Other abnormal glucose: Secondary | ICD-10-CM

## 2020-07-10 DIAGNOSIS — E559 Vitamin D deficiency, unspecified: Secondary | ICD-10-CM | POA: Diagnosis not present

## 2020-07-10 DIAGNOSIS — Z23 Encounter for immunization: Secondary | ICD-10-CM

## 2020-07-10 DIAGNOSIS — I10 Essential (primary) hypertension: Secondary | ICD-10-CM | POA: Diagnosis not present

## 2020-07-10 DIAGNOSIS — E782 Mixed hyperlipidemia: Secondary | ICD-10-CM

## 2020-07-10 MED ORDER — METOPROLOL TARTRATE 25 MG PO TABS: 12.5000 mg | ORAL_TABLET | Freq: Two times a day (BID) | ORAL | 3 refills | Status: DC

## 2020-07-10 MED ORDER — LOSARTAN POTASSIUM 100 MG PO TABS: 100.0000 mg | ORAL_TABLET | Freq: Every day | ORAL | 3 refills | Status: DC

## 2020-07-11 ENCOUNTER — Encounter: Payer: Self-pay | Admitting: Family Medicine

## 2020-07-11 LAB — LIPID PANEL
Cholesterol: 106 mg/dL (ref <200)
HDL: 37 mg/dL — ABNORMAL LOW (ref >=40)
LDL Cholesterol (Calc): 45 mg/dL (calc)
Non-HDL Cholesterol (Calc): 69 mg/dL (calc) (ref <130)
Total CHOL/HDL Ratio: 2.9 (calc) (ref <5.0)
Triglycerides: 158 mg/dL — ABNORMAL HIGH (ref <150)

## 2020-07-11 LAB — VITAMIN B12: Vitamin B-12: 1148 pg/mL — ABNORMAL HIGH (ref 200–1100)

## 2020-07-11 LAB — HEMOGLOBIN A1C
Hgb A1c MFr Bld: 5.9 % of total Hgb — ABNORMAL HIGH (ref <5.7)
Mean Plasma Glucose: 123 (calc)
eAG (mmol/L): 6.8 (calc)

## 2020-07-11 LAB — VITAMIN D 25 HYDROXY (VIT D DEFICIENCY, FRACTURES): Vit D, 25-Hydroxy: 30 ng/mL (ref 30–100)

## 2020-07-11 LAB — TSH: TSH: 4 mIU/L (ref 0.40–4.50)

## 2020-07-11 LAB — PSA: PSA: 0.4 ng/mL (ref <=4.0)

## 2020-07-11 MED ORDER — LEVOTHYROXINE SODIUM 50 MCG PO TABS: 50.0000 ug | ORAL_TABLET | Freq: Every day | ORAL | 3 refills | Status: DC

## 2020-07-11 NOTE — Addendum Note (Signed)
Addended by: Lamar Blinks C on: 07/11/2020 07:02 AM   Modules accepted: Orders

## 2020-07-29 ENCOUNTER — Other Ambulatory Visit: Payer: Self-pay | Admitting: Cardiovascular Disease

## 2020-07-29 DIAGNOSIS — I10 Essential (primary) hypertension: Secondary | ICD-10-CM

## 2020-08-04 ENCOUNTER — Other Ambulatory Visit: Payer: Self-pay | Admitting: Cardiovascular Disease

## 2020-08-04 DIAGNOSIS — I10 Essential (primary) hypertension: Secondary | ICD-10-CM

## 2020-08-07 ENCOUNTER — Telehealth: Payer: Self-pay | Admitting: Hematology & Oncology

## 2020-08-07 ENCOUNTER — Inpatient Hospital Stay: Payer: Medicare HMO

## 2020-08-07 ENCOUNTER — Encounter: Payer: Self-pay | Admitting: Hematology & Oncology

## 2020-08-07 ENCOUNTER — Inpatient Hospital Stay: Payer: Medicare HMO | Attending: Hematology & Oncology | Admitting: Hematology & Oncology

## 2020-08-07 ENCOUNTER — Other Ambulatory Visit: Payer: Self-pay

## 2020-08-07 VITALS — BP 131/55 | HR 72 | Temp 98.4°F | Resp 18 | Wt 206.0 lb

## 2020-08-07 DIAGNOSIS — C7B8 Other secondary neuroendocrine tumors: Secondary | ICD-10-CM

## 2020-08-07 DIAGNOSIS — K909 Intestinal malabsorption, unspecified: Secondary | ICD-10-CM

## 2020-08-07 DIAGNOSIS — D500 Iron deficiency anemia secondary to blood loss (chronic): Secondary | ICD-10-CM

## 2020-08-07 DIAGNOSIS — C7A8 Other malignant neuroendocrine tumors: Secondary | ICD-10-CM | POA: Diagnosis present

## 2020-08-07 DIAGNOSIS — D509 Iron deficiency anemia, unspecified: Secondary | ICD-10-CM | POA: Diagnosis not present

## 2020-08-07 LAB — CMP (CANCER CENTER ONLY)
ALT: 15 U/L (ref 0–44)
AST: 23 U/L (ref 15–41)
Albumin: 3.9 g/dL (ref 3.5–5.0)
Alkaline Phosphatase: 49 U/L (ref 38–126)
Anion gap: 8 (ref 5–15)
BUN: 24 mg/dL — ABNORMAL HIGH (ref 8–23)
CO2: 32 mmol/L (ref 22–32)
Calcium: 9.8 mg/dL (ref 8.9–10.3)
Chloride: 99 mmol/L (ref 98–111)
Creatinine: 1.14 mg/dL (ref 0.61–1.24)
GFR, Estimated: >60 mL/min (ref >60)
Glucose, Bld: 137 mg/dL — ABNORMAL HIGH (ref 70–99)
Potassium: 4.3 mmol/L (ref 3.5–5.1)
Sodium: 139 mmol/L (ref 135–145)
Total Bilirubin: 0.7 mg/dL (ref 0.3–1.2)
Total Protein: 7.2 g/dL (ref 6.5–8.1)

## 2020-08-07 LAB — CBC WITH DIFFERENTIAL (CANCER CENTER ONLY)
Abs Immature Granulocytes: 0.02 10*3/uL (ref 0.00–0.07)
Basophils Absolute: 0 10*3/uL (ref 0.0–0.1)
Basophils Relative: 1 %
Eosinophils Absolute: 0.3 10*3/uL (ref 0.0–0.5)
Eosinophils Relative: 4 %
HCT: 38.5 % — ABNORMAL LOW (ref 39.0–52.0)
Hemoglobin: 12.6 g/dL — ABNORMAL LOW (ref 13.0–17.0)
Immature Granulocytes: 0 %
Lymphocytes Relative: 16 %
Lymphs Abs: 1 10*3/uL (ref 0.7–4.0)
MCH: 30.9 pg (ref 26.0–34.0)
MCHC: 32.7 g/dL (ref 30.0–36.0)
MCV: 94.4 fL (ref 80.0–100.0)
Monocytes Absolute: 0.3 10*3/uL (ref 0.1–1.0)
Monocytes Relative: 5 %
Neutro Abs: 4.8 10*3/uL (ref 1.7–7.7)
Neutrophils Relative %: 74 %
Platelet Count: 148 10*3/uL — ABNORMAL LOW (ref 150–400)
RBC: 4.08 MIL/uL — ABNORMAL LOW (ref 4.22–5.81)
RDW: 13.5 % (ref 11.5–15.5)
WBC Count: 6.4 10*3/uL (ref 4.0–10.5)
nRBC: 0 % (ref 0.0–0.2)

## 2020-08-07 MED ORDER — LANREOTIDE ACETATE 120 MG/0.5ML ~~LOC~~ SOLN
120.0000 mg | Freq: Once | SUBCUTANEOUS | Status: AC
  Administered 2020-08-07: 120 mg via SUBCUTANEOUS

## 2020-08-07 NOTE — Progress Notes (Signed)
Hematology and Oncology Follow Up Visit  Brian Mays [id number] 11/23/1945 74 y.o. 08/07/2020   Principle Diagnosis:  Metastatic low grade neuroendocrine tumor-hepatic metastases Iron deficiency anemia  Past Therapy: S/p yttrium-90 intrahepatic therapy - November 2016 Lutathera (Lu 177) injection on 02/01/2018 - s/p cycle 4  Current Therapy: Somatuline 120 mg monthly IV Iron as indicated    Interim History:  [name] is here today for follow-up.  He seems be doing pretty well.  He is quite tanned.  He has been doing work outside.  Is been playing some golf.  He has had no problems with respect to cough or shortness of breath.  Has had no issues with the coronavirus.  His last Chromogranin A level is up a little bit at 154.  He has had no issues with diarrhea.  He has had no wheezing.  So far, his blood pressure has been under very good control.  He has had no swollen lymph nodes.  He has had no nausea or vomiting.  There is been no issues with leg swelling.  He has had no headache.  His wife is still having a slow recovery.  She has rheumatoid arthritis.  Currently, I would say his performance status is ECOG 1.   Medications:  Allergies as of 08/07/2020      Reactions   Iohexol Hives    Code: HIVES, Desc: PER [name] @ PRIMARY CARE, PT IS ALLERGIC TO CONTRAST DYE 10/02/08/RM  05/01/10...needs full premeds per our protocol w/ [facility]., Onset Date: [id number]      Medication List       Accurate as of August 07, 2020  2:55 PM. If you have any questions, ask your nurse or doctor.        aspirin EC 81 MG tablet Take 81 mg by mouth every morning.   atorvastatin 20 MG tablet Commonly known as: LIPITOR TAKE 1 TABLET (20 MG TOTAL) BY MOUTH DAILY AT 6 PM.   Cholecalciferol 25 MCG (1000 UT) tablet Take 1,000 Units by mouth daily.   clopidogrel 75 MG tablet Commonly known as: PLAVIX TAKE 1 TABLET BY MOUTH EVERY DAY   fluticasone 50 MCG/ACT nasal  spray Commonly known as: FLONASE Place 2 sprays into both nostrils daily as needed (sinuses).   hydrochlorothiazide 25 MG tablet Commonly known as: HYDRODIURIL Take 12.5 mg by mouth daily.   hydrocortisone 25 MG suppository Commonly known as: ANUSOL-HC INSERT 1 SUPPOSITORY RECTALLY TWICE A DAY AS NEEDED FOR HEMORRHOIDS   ipratropium 0.03 % nasal spray Commonly known as: Atrovent Place 2 sprays into the nose 3 (three) times daily.   lansoprazole 30 MG capsule Commonly known as: PREVACID Take 1 capsule (30 mg total) by mouth daily at 12 noon.   levothyroxine 50 MCG tablet Commonly known as: SYNTHROID Take 1 tablet (50 mcg total) by mouth daily before breakfast.   losartan 100 MG tablet Commonly known as: COZAAR TAKE 1 TABLET BY MOUTH EVERY DAY   metoprolol tartrate 25 MG tablet Commonly known as: LOPRESSOR Take 0.5 tablets (12.5 mg total) by mouth 2 (two) times daily.   nitroGLYCERIN 0.4 MG SL tablet Commonly known as: NITROSTAT PLACE 1 TABLET UNDER THE TONGUE EVERY 5 (FIVE) MINUTES X 3 DOSES AS NEEDED FOR CHEST PAIN.   sodium chloride 0.65 % Soln nasal spray Commonly known as: OCEAN Place 1 spray into both nostrils as needed for congestion.   SOMATULINE DEPOT [place] Inject 120 mcg into the skin every 28 (twenty-eight) days. Receives at  Dr Antonieta Pert office   vitamin B-12 1000 MCG tablet Commonly known as: CYANOCOBALAMIN Take 1,000 mcg by mouth every evening.       Allergies:  Allergies  Allergen Reactions  . Iohexol Hives     Code: HIVES, Desc: PER [name] @ PRIMARY CARE, PT IS ALLERGIC TO CONTRAST DYE 10/02/08/RM  05/01/10...needs full premeds per our protocol w/ [facility]., Onset Date: [id number]     Past Medical History, Surgical history, Social history, and Family History were reviewed and updated.  Review of Systems: Review of Systems  Constitutional: Negative.   HENT: Negative.   Eyes: Negative.   Respiratory: Negative.   Cardiovascular: Negative.    Gastrointestinal: Negative.   Genitourinary: Negative.   Musculoskeletal: Negative.   Skin: Negative.   Neurological: Negative.   Endo/Heme/Allergies: Negative.   Psychiatric/Behavioral: Negative.      Physical Exam:  weight is 206 lb (93.4 kg). His oral temperature is 98.4 F (36.9 C). His blood pressure is 131/55 (abnormal) and his pulse is 72. His respiration is 18 and oxygen saturation is 98%.   Wt Readings from Last 3 Encounters:  08/07/20 206 lb (93.4 kg)  07/10/20 207 lb (93.9 kg)  07/04/20 205 lb 1.9 oz (93 kg)    Physical Exam Vitals reviewed.  HENT:     Head: Normocephalic and atraumatic.  Eyes:     Pupils: Pupils are equal, round, and reactive to light.  Cardiovascular:     Rate and Rhythm: Normal rate and regular rhythm.     Heart sounds: Normal heart sounds.  Pulmonary:     Effort: Pulmonary effort is normal.     Breath sounds: Normal breath sounds.  Abdominal:     General: Bowel sounds are normal.     Palpations: Abdomen is soft.  Musculoskeletal:        General: No tenderness or deformity. Normal range of motion.     Cervical back: Normal range of motion.  Lymphadenopathy:     Cervical: No cervical adenopathy.  Skin:    General: Skin is warm and dry.     Findings: No erythema or rash.  Neurological:     Mental Status: He is alert and oriented to person, place, and time.  Psychiatric:        Behavior: Behavior normal.        Thought Content: Thought content normal.        Judgment: Judgment normal.    Lab Results  Component Value Date   WBC 6.4 08/07/2020   HGB 12.6 (L) 08/07/2020   HCT 38.5 (L) 08/07/2020   MCV 94.4 08/07/2020   PLT 148 (L) 08/07/2020   Lab Results  Component Value Date   FERRITIN 210 04/30/2020   IRON 65 04/30/2020   TIBC 278 04/30/2020   UIBC 213 04/30/2020   IRONPCTSAT 23 04/30/2020   Lab Results  Component Value Date   RBC 4.08 (L) 08/07/2020   No results found for: KPAFRELGTCHN, LAMBDASER, KAPLAMBRATIO No  results found for: IGGSERUM, IGA, IGMSERUM No results found for: Odetta Pink, SPEI   Chemistry      Component Value Date/Time   NA 139 08/07/2020 1355   NA 138 09/15/2018 0934   NA 144 10/04/2017 0904   NA 141 07/23/2016 1255   K 4.3 08/07/2020 1355   K 3.8 10/04/2017 0904   K 4.1 07/23/2016 1255   CL 99 08/07/2020 1355   CL 103 10/04/2017 0904   CO2 32 08/07/2020  1355   CO2 28 10/04/2017 0904   CO2 27 07/23/2016 1255   BUN 24 (H) 08/07/2020 1355   BUN 14 09/15/2018 0934   BUN 14 10/04/2017 0904   BUN 13.8 07/23/2016 1255   CREATININE 1.14 08/07/2020 1355   CREATININE 0.8 10/04/2017 0904   CREATININE 1.0 07/23/2016 1255      Component Value Date/Time   CALCIUM 9.8 08/07/2020 1355   CALCIUM 9.4 10/04/2017 0904   CALCIUM 9.2 07/23/2016 1255   ALKPHOS 49 08/07/2020 1355   ALKPHOS 64 10/04/2017 0904   ALKPHOS 77 07/23/2016 1255   AST 23 08/07/2020 1355   AST 23 07/23/2016 1255   ALT 15 08/07/2020 1355   ALT 22 10/04/2017 0904   ALT 13 07/23/2016 1255   BILITOT 0.7 08/07/2020 1355   BILITOT 1.13 07/23/2016 1255       Impression and Plan: Mr. Pienta is a very pleasant 74 yo caucasian gentleman with metastatic low-grade neuroendocrine carcinoma.  He has hepatic metastasis.  He has been through [facility] treatment.  He had relatively stable disease with this.  He currently is on Somatuline monthly.  We really need to get another dotatate scan on him.  We have a new dotatate scan that is copper based.  Maybe we can get this set up for him.  I really think this would be helpful so that we can guide our treatment of him.  We will plan to have the scan done in 4 weeks.  I will then plan to see him back in 5 weeks.    Volanda Napoleon, MD 10/7/20212:55 PM

## 2020-08-07 NOTE — Patient Instructions (Signed)
Lanreotide injection What is this medicine? LANREOTIDE (lan REE oh tide) is used to reduce blood levels of growth hormone in patients with a condition called acromegaly. It also works to slow or stop tumor growth in patients with neuroendocrine tumors and treat carcinoid syndrome. This medicine may be used for other purposes; ask your health care provider or pharmacist if you have questions. COMMON BRAND NAME(S): Somatuline [place] What should I tell my health care provider before I take this medicine? They need to know if you have any of these conditions:  diabetes  gallbladder disease  heart disease  kidney disease  liver disease  thyroid disease  an unusual or allergic reaction to lanreotide, other medicines, foods, dyes, or preservatives  pregnant or trying to get pregnant  breast-feeding How should I use this medicine? This medicine is for injection under the skin. It is given by a health care professional in a hospital or clinic setting. Contact your pediatrician or health care professional regarding the use of this medicine in children. Special care may be needed. Overdosage: If you think you have taken too much of this medicine contact a poison control center or emergency room at once. NOTE: This medicine is only for you. Do not share this medicine with others. What if I miss a dose? It is important not to miss your dose. Call your doctor or health care professional if you are unable to keep an appointment. What may interact with this medicine? This medicine may interact with the following medications:  bromocriptine  cyclosporine  certain medicines for blood pressure, heart disease, irregular heart beat  certain medicines for diabetes  quinidine  terfenadine This list may not describe all possible interactions. Give your health care provider a list of all the medicines, herbs, non-prescription drugs, or dietary supplements you use. Also tell them if you smoke,  drink alcohol, or use illegal drugs. Some items may interact with your medicine. What should I watch for while using this medicine? Tell your doctor or healthcare professional if your symptoms do not start to get better or if they get worse. Visit your doctor or health care professional for regular checks on your progress. Your condition will be monitored carefully while you are receiving this medicine. This medicine may increase blood sugar. Ask your healthcare provider if changes in diet or medicines are needed if you have diabetes. You may need blood work done while you are taking this medicine. Women should inform their doctor if they wish to become pregnant or think they might be pregnant. There is a potential for serious side effects to an unborn child. Talk to your health care professional or pharmacist for more information. Do not breast-feed an infant while taking this medicine or for 6 months after stopping it. This medicine has caused ovarian failure in some women. This medicine may interfere with the ability to have a child. Talk with your doctor or health care professional if you are concerned about your fertility. What side effects may I notice from receiving this medicine? Side effects that you should report to your doctor or health care professional as soon as possible:  allergic reactions like skin rash, itching or hives, swelling of the face, lips, or tongue  increased blood pressure  severe stomach pain  signs and symptoms of hgh blood sugar such as being more thirsty or hungry or having to urinate more than normal. You may also feel very tired or have blurry vision.  signs and symptoms of low blood   sugar such as feeling anxious; confusion; dizziness; increased hunger; unusually weak or tired; sweating; shakiness; cold; irritable; headache; blurred vision; fast heartbeat; loss of consciousness  unusually slow heartbeat Side effects that usually do not require medical  attention (report to your doctor or health care professional if they continue or are bothersome):  constipation  diarrhea  dizziness  headache  muscle pain  muscle spasms  nausea  pain, redness, or irritation at site where injected This list may not describe all possible side effects. Call your doctor for medical advice about side effects. You may report side effects to FDA at 1-800-FDA-1088. Where should I keep my medicine? This drug is given in a hospital or clinic and will not be stored at home. NOTE: This sheet is a summary. It may not cover all possible information. If you have questions about this medicine, talk to your doctor, pharmacist, or health care provider.  2020 Elsevier/Gold Standard (2018-07-27 09:13:08)  

## 2020-08-07 NOTE — Telephone Encounter (Signed)
Appointments scheduled patient has My Chart Access for appts per 10/7 per 10/7

## 2020-08-08 LAB — FERRITIN: Ferritin: 200 ng/mL (ref 24–336)

## 2020-08-08 LAB — IRON AND TIBC
Iron: 70 ug/dL (ref 42–163)
Saturation Ratios: 24 % (ref 20–55)
TIBC: 293 ug/dL (ref 202–409)
UIBC: 223 ug/dL (ref 117–376)

## 2020-08-08 LAB — CHROMOGRANIN A: Chromogranin A (ng/mL): 129.5 ng/mL — ABNORMAL HIGH (ref 0.0–101.8)

## 2020-08-11 ENCOUNTER — Other Ambulatory Visit: Payer: Self-pay | Admitting: Hematology & Oncology

## 2020-08-11 DIAGNOSIS — C7B8 Other secondary neuroendocrine tumors: Secondary | ICD-10-CM

## 2020-08-15 ENCOUNTER — Other Ambulatory Visit: Payer: Self-pay | Admitting: *Deleted

## 2020-08-15 ENCOUNTER — Telehealth: Payer: Self-pay | Admitting: *Deleted

## 2020-08-15 NOTE — Telephone Encounter (Signed)
I returned patient's phone call regarding CT scan and PET scan. I informed patient that his insurance company denied the PET scan but authorized the CT scan. I gave him the number to Radiology [phone]) to go ahead and schedule the CT scan. Also, I reviewed the Intravenous Contrast pre-medication regime to him. He needs to take Prednisone 50 mg by mouth at 13, 7 and 1 hour before the injection (CT scan). He needs to take Benadryl 50 mg by mouth within one hour prior to the injection. Patient stated,"I already have prednisone 50 mg tablets and benadry l50 mg on hand. I understand how to take the medications." Patient verbalized understanding of conversation.

## 2020-08-25 ENCOUNTER — Other Ambulatory Visit: Payer: Self-pay | Admitting: Cardiovascular Disease

## 2020-08-25 DIAGNOSIS — I10 Essential (primary) hypertension: Secondary | ICD-10-CM

## 2020-08-25 DIAGNOSIS — E7800 Pure hypercholesterolemia, unspecified: Secondary | ICD-10-CM

## 2020-09-02 ENCOUNTER — Encounter: Payer: Self-pay | Admitting: *Deleted

## 2020-09-02 ENCOUNTER — Other Ambulatory Visit: Payer: Self-pay

## 2020-09-02 ENCOUNTER — Ambulatory Visit (HOSPITAL_BASED_OUTPATIENT_CLINIC_OR_DEPARTMENT_OTHER)
Admission: RE | Admit: 2020-09-02 | Discharge: 2020-09-02 | Disposition: A | Discharge status: Home / Self Care | Payer: Medicare HMO | Source: Ambulatory Visit | Attending: Hematology & Oncology | Admitting: Hematology & Oncology

## 2020-09-02 DIAGNOSIS — C7B8 Other secondary neuroendocrine tumors: Secondary | ICD-10-CM | POA: Diagnosis not present

## 2020-09-02 DIAGNOSIS — K7689 Other specified diseases of liver: Secondary | ICD-10-CM | POA: Diagnosis not present

## 2020-09-02 DIAGNOSIS — K769 Liver disease, unspecified: Secondary | ICD-10-CM | POA: Insufficient documentation

## 2020-09-02 DIAGNOSIS — I700 Atherosclerosis of aorta: Secondary | ICD-10-CM | POA: Insufficient documentation

## 2020-09-02 DIAGNOSIS — Z86012 Personal history of benign carcinoid tumor: Secondary | ICD-10-CM | POA: Diagnosis not present

## 2020-09-02 DIAGNOSIS — D7389 Other diseases of spleen: Secondary | ICD-10-CM | POA: Diagnosis not present

## 2020-09-02 DIAGNOSIS — C7A8 Other malignant neuroendocrine tumors: Secondary | ICD-10-CM | POA: Diagnosis not present

## 2020-09-02 IMAGING — CT CT ABD-PELV W/ CM
2 of 5 series · 15 of 46 positions shown, 17 images · IV contrast (omnipaque)
Comparison: PET-CT [DATE].

CLINICAL DATA: Restaging metastatic neuroendocrine tumor.

EXAM:
CT ABDOMEN AND PELVIS WITH CONTRAST
TECHNIQUE: Multidetector CT imaging of the abdomen and pelvis was performed
using the standard protocol following bolus administration of
intravenous contrast.
CONTRAST:  100mL OMNIPAQUE IOHEXOL 300 MG/ML  SOLN

[Series 2: axial st · axial · 0.91mm/px · z∈[-534,-54]mm · 12 of 110 slices shown, 14 images]
[im 7/110  soft-tissue]
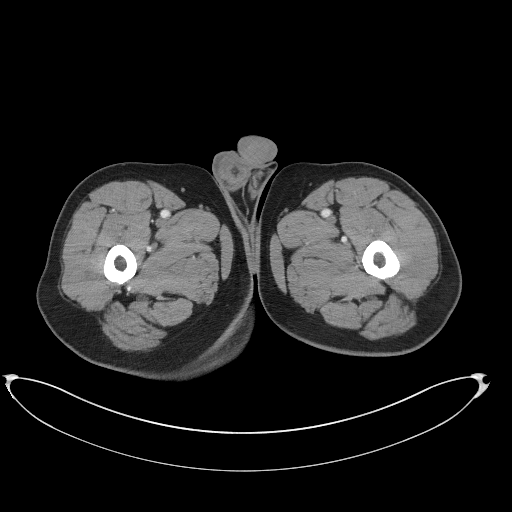
[im 7/110  bone]
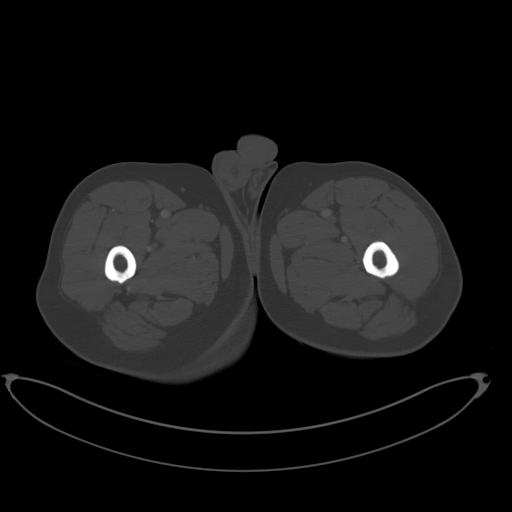
[im 20/110  soft-tissue]
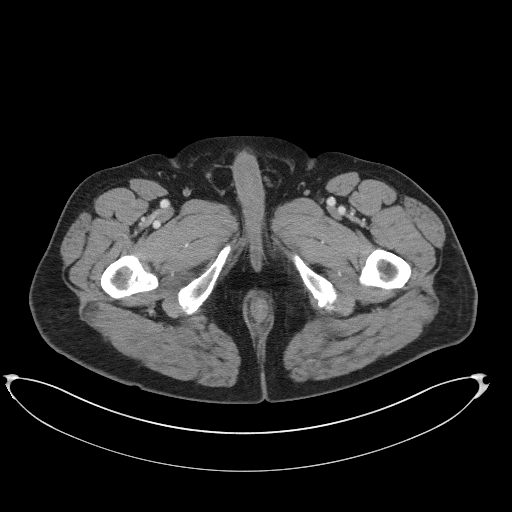
[im 26/110  soft-tissue]
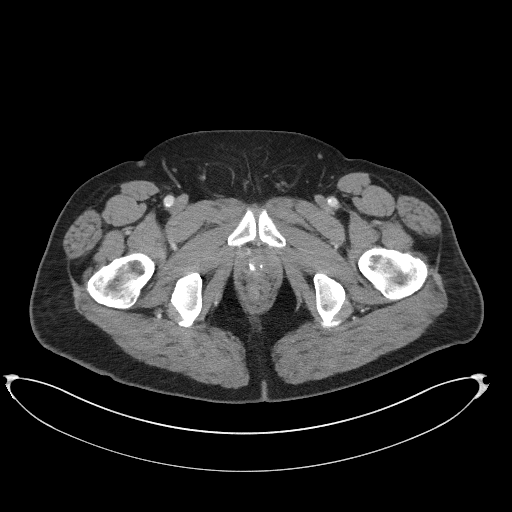
[im 33/110  soft-tissue]
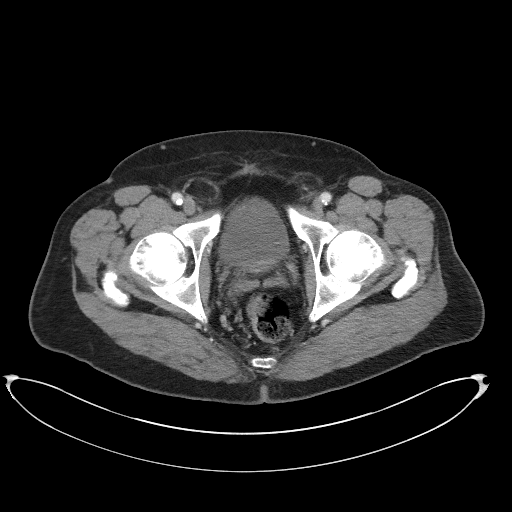
[im 45/110  soft-tissue]
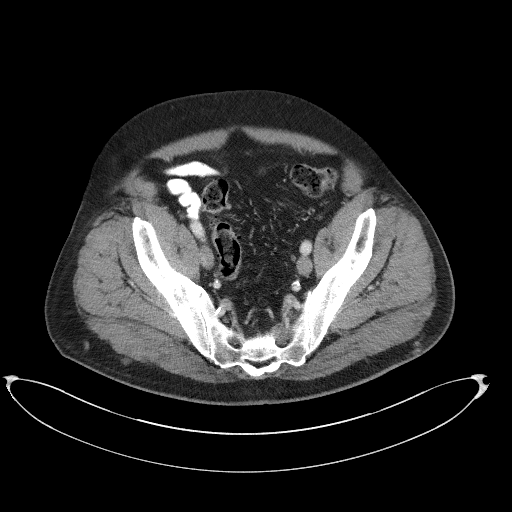
[im 52/110  soft-tissue]
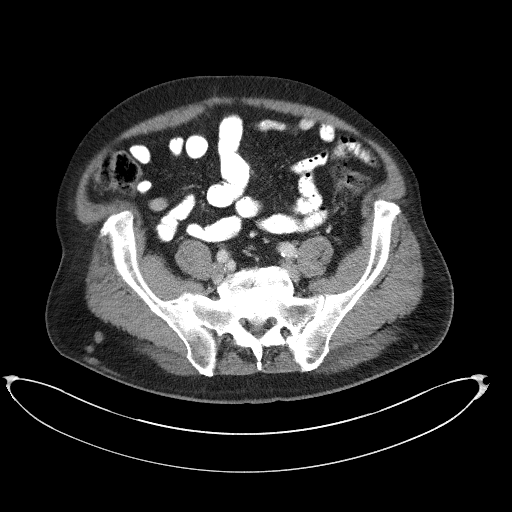
[im 58/110  soft-tissue]
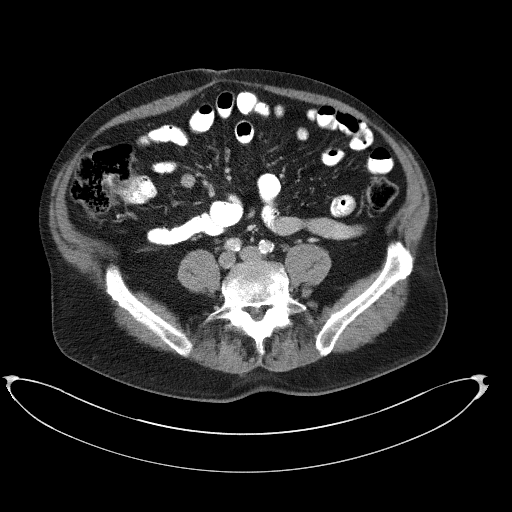
[im 71/110  soft-tissue]
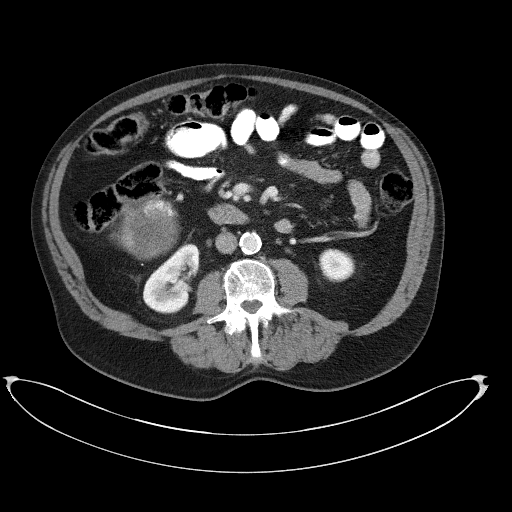
[im 77/110  soft-tissue]
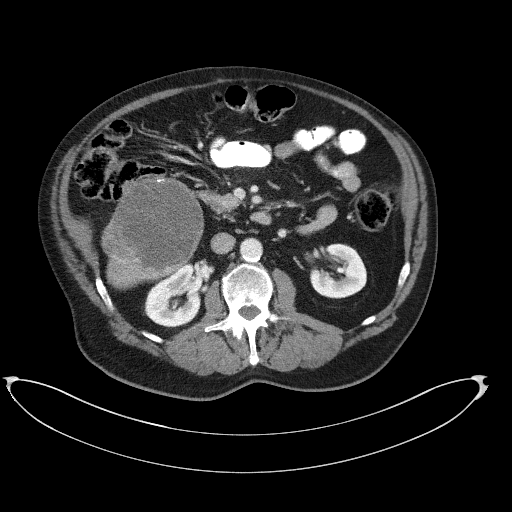
[im 77/110  bone]
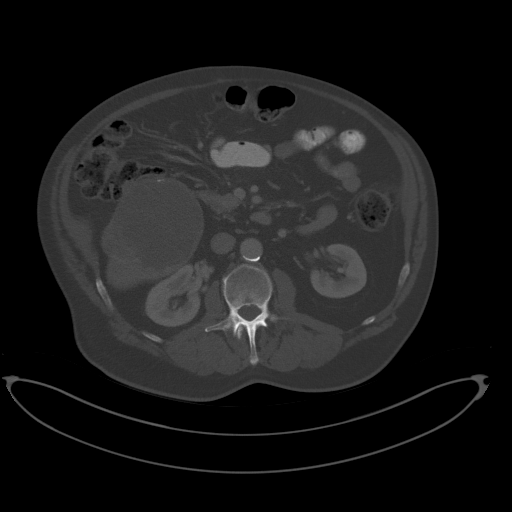
[im 84/110  soft-tissue]
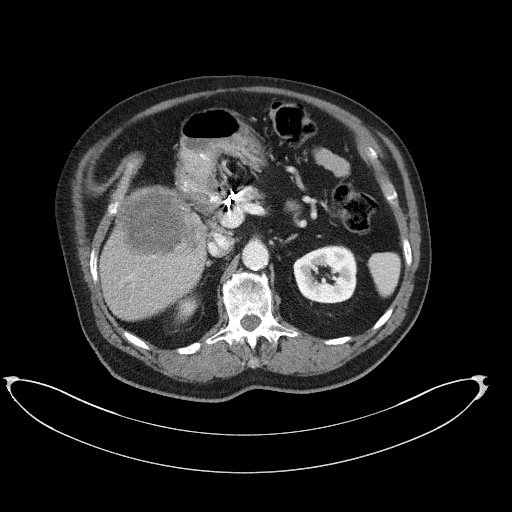
[im 97/110  soft-tissue]
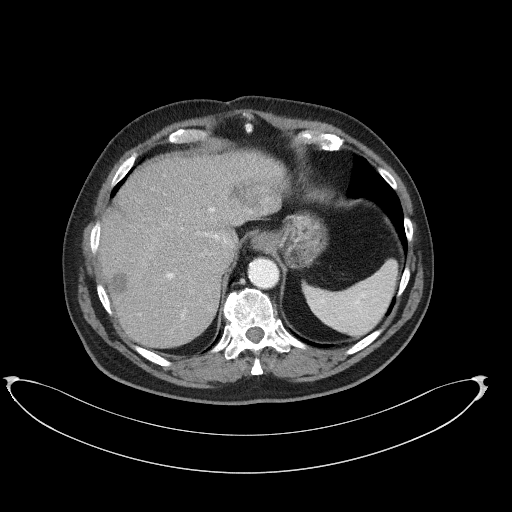
[im 103/110  soft-tissue]
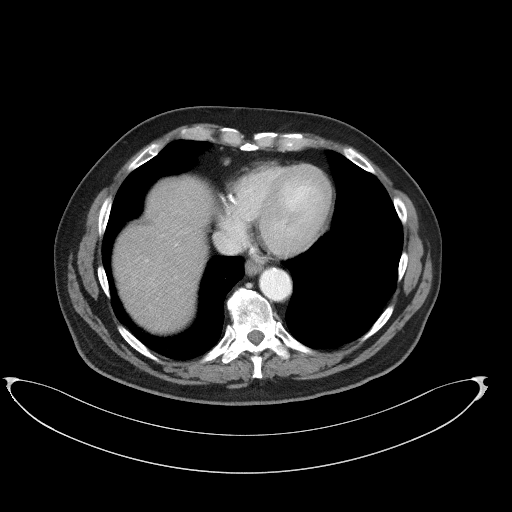

[Series 4: coronal st · coronal · 0.85mm/px · 3 of 112 slices shown]
[im 38/112  soft-tissue]
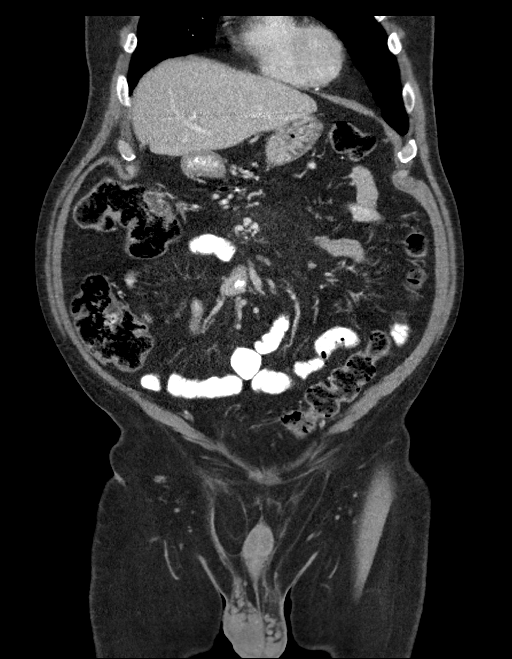
[im 50/112  soft-tissue]
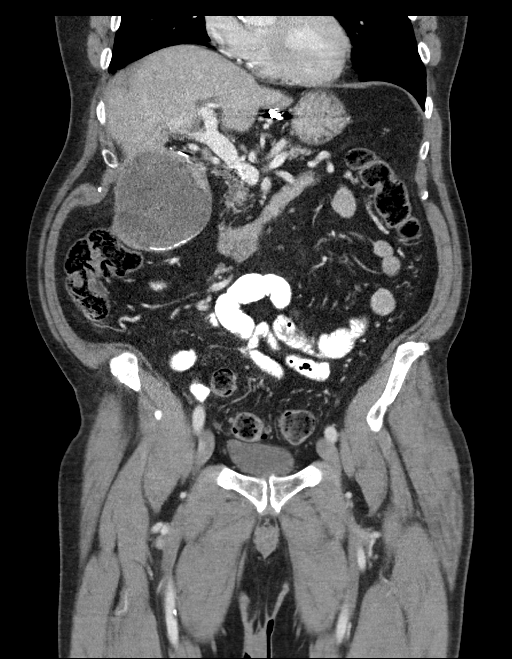
[im 62/112  soft-tissue]
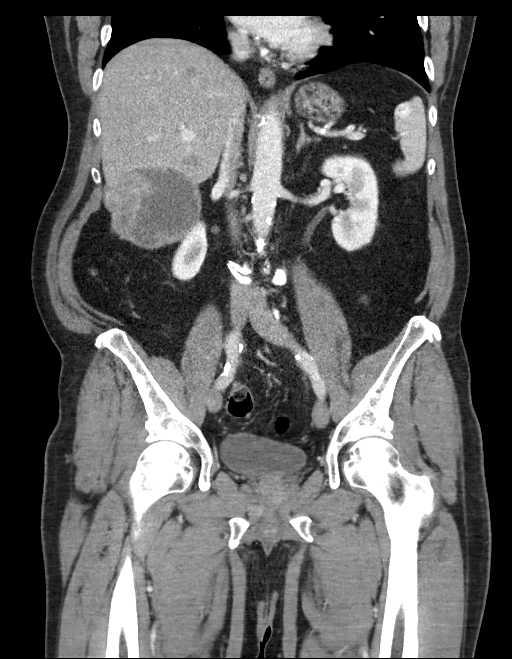

[15 of 46 positions shown; findings below may reference images not displayed]

FINDINGS: Lower chest: No acute abnormality.

Hepatobiliary: Multifocal liver metastases are again noted. Index
lesion within segment 4a measures 2 cm, image [DATE]. Unchanged.

Index lesion within segment 7 measures 2.6 cm, image [DATE].
Unchanged.

Index lesion within lateral segment of left hepatic lobe measures
4.7 cm, image [DATE]. Unchanged from previous exam. Index lesion
within the central aspect of liver between the right and left portal
vein measures 3.1 cm, image [DATE]. Previously 3 cm.

Dominant exophytic lesion arising off the inferior aspect of right
hepatic lobe measures 9.7 cm, image 32/2. Unchanged. Previous
cholecystectomy. No bile duct dilatation.

Pancreas: Unremarkable. No pancreatic ductal dilatation or
surrounding inflammatory changes.

Spleen: Normal in size. Small enhancing lesion within the spleen
measuring 1.2 cm is unchanged from previous exam, image [DATE]. This
is favored to represent a benign lesion.

Adrenals/Urinary Tract: Normal appearance of the adrenal glands. No
suspicious kidney mass or hydronephrosis identified bilaterally.

Stomach/Bowel: Stomach is nondistended. No dilated loops of small or
large bowel. No bowel wall thickening, inflammation, or distension.

Vascular/Lymphatic: Aortic atherosclerosis. No aneurysm. Partially
calcified mesenteric mass measures 2.6 cm, image 47/2. Previously
2.4 cm.

Reproductive: Prostate is unremarkable.

Other: No free fluid or fluid collections identified within the
abdomen or pelvis.

Musculoskeletal: No acute or significant osseous findings. No CT
correlate on today's exam identified to correspond with the previous
neuroendocrine tumor receptor avid lesion noted within the T12
vertebra on prior exam.
IMPRESSION: 1. No significant change in size and number of liver lesions.
2. Slight increase in size of partially calcified mesenteric mass.
3. No CT correlate on today's exam identified to correspond with the
previous neuroendocrine tumor receptor avid lesion noted within the
T12 vertebra on prior exam.
4. Aortic atherosclerosis.

Aortic Atherosclerosis ([G4]-[G4]).

## 2020-09-02 MED ORDER — IOHEXOL 300 MG/ML  SOLN
100.0000 mL | Freq: Once | INTRAMUSCULAR | Status: AC | PRN
  Administered 2020-09-02: 100 mL via INTRAVENOUS

## 2020-09-15 ENCOUNTER — Inpatient Hospital Stay (HOSPITAL_BASED_OUTPATIENT_CLINIC_OR_DEPARTMENT_OTHER): Payer: Medicare HMO | Admitting: Hematology & Oncology

## 2020-09-15 ENCOUNTER — Inpatient Hospital Stay: Payer: Medicare HMO

## 2020-09-15 ENCOUNTER — Inpatient Hospital Stay: Payer: Medicare HMO | Attending: Hematology & Oncology

## 2020-09-15 ENCOUNTER — Encounter: Payer: Self-pay | Admitting: Hematology & Oncology

## 2020-09-15 ENCOUNTER — Other Ambulatory Visit: Payer: Self-pay

## 2020-09-15 VITALS — BP 157/65 | HR 63 | Temp 98.4°F | Resp 16 | Wt 207.0 lb

## 2020-09-15 DIAGNOSIS — C7B8 Other secondary neuroendocrine tumors: Secondary | ICD-10-CM

## 2020-09-15 DIAGNOSIS — K909 Intestinal malabsorption, unspecified: Secondary | ICD-10-CM

## 2020-09-15 DIAGNOSIS — D509 Iron deficiency anemia, unspecified: Secondary | ICD-10-CM | POA: Diagnosis not present

## 2020-09-15 DIAGNOSIS — C7A8 Other malignant neuroendocrine tumors: Secondary | ICD-10-CM | POA: Insufficient documentation

## 2020-09-15 DIAGNOSIS — D500 Iron deficiency anemia secondary to blood loss (chronic): Secondary | ICD-10-CM

## 2020-09-15 LAB — CBC WITH DIFFERENTIAL (CANCER CENTER ONLY)
Abs Immature Granulocytes: 0.04 10*3/uL (ref 0.00–0.07)
Basophils Absolute: 0.1 10*3/uL (ref 0.0–0.1)
Basophils Relative: 1 %
Eosinophils Absolute: 0.3 10*3/uL (ref 0.0–0.5)
Eosinophils Relative: 4 %
HCT: 39.8 % (ref 39.0–52.0)
Hemoglobin: 12.8 g/dL — ABNORMAL LOW (ref 13.0–17.0)
Immature Granulocytes: 1 %
Lymphocytes Relative: 15 %
Lymphs Abs: 1.1 10*3/uL (ref 0.7–4.0)
MCH: 30.4 pg (ref 26.0–34.0)
MCHC: 32.2 g/dL (ref 30.0–36.0)
MCV: 94.5 fL (ref 80.0–100.0)
Monocytes Absolute: 0.5 10*3/uL (ref 0.1–1.0)
Monocytes Relative: 6 %
Neutro Abs: 5.4 10*3/uL (ref 1.7–7.7)
Neutrophils Relative %: 73 %
Platelet Count: 153 10*3/uL (ref 150–400)
RBC: 4.21 MIL/uL — ABNORMAL LOW (ref 4.22–5.81)
RDW: 14.1 % (ref 11.5–15.5)
WBC Count: 7.4 10*3/uL (ref 4.0–10.5)
nRBC: 0 % (ref 0.0–0.2)

## 2020-09-15 LAB — CMP (CANCER CENTER ONLY)
ALT: 14 U/L (ref 0–44)
AST: 20 U/L (ref 15–41)
Albumin: 4 g/dL (ref 3.5–5.0)
Alkaline Phosphatase: 51 U/L (ref 38–126)
Anion gap: 5 (ref 5–15)
BUN: 16 mg/dL (ref 8–23)
CO2: 32 mmol/L (ref 22–32)
Calcium: 10 mg/dL (ref 8.9–10.3)
Chloride: 102 mmol/L (ref 98–111)
Creatinine: 1.04 mg/dL (ref 0.61–1.24)
GFR, Estimated: >60 mL/min
Glucose, Bld: 99 mg/dL (ref 70–99)
Potassium: 4.8 mmol/L (ref 3.5–5.1)
Sodium: 139 mmol/L (ref 135–145)
Total Bilirubin: 0.7 mg/dL (ref 0.3–1.2)
Total Protein: 7.1 g/dL (ref 6.5–8.1)

## 2020-09-15 MED ORDER — LANREOTIDE ACETATE 120 MG/0.5ML ~~LOC~~ SOLN
SUBCUTANEOUS | Status: AC
  Filled 2020-09-15: qty 120

## 2020-09-15 MED ORDER — LANREOTIDE ACETATE 120 MG/0.5ML ~~LOC~~ SOLN
120.0000 mg | Freq: Once | SUBCUTANEOUS | Status: AC
  Administered 2020-09-15: 120 mg via SUBCUTANEOUS

## 2020-09-15 NOTE — Progress Notes (Signed)
Pt discharged in no apparent distress. Pt left ambulatory without assistance. Pt aware of discharge instructions and verbalized understanding and had no further questions.  

## 2020-09-15 NOTE — Patient Instructions (Signed)
Lanreotide injection What is this medicine? LANREOTIDE (lan REE oh tide) is used to reduce blood levels of growth hormone in patients with a condition called acromegaly. It also works to slow or stop tumor growth in patients with neuroendocrine tumors and treat carcinoid syndrome. This medicine may be used for other purposes; ask your health care provider or pharmacist if you have questions. COMMON BRAND NAME(S): Somatuline [place] What should I tell my health care provider before I take this medicine? They need to know if you have any of these conditions:  diabetes  gallbladder disease  heart disease  kidney disease  liver disease  thyroid disease  an unusual or allergic reaction to lanreotide, other medicines, foods, dyes, or preservatives  pregnant or trying to get pregnant  breast-feeding How should I use this medicine? This medicine is for injection under the skin. It is given by a health care professional in a hospital or clinic setting. Contact your pediatrician or health care professional regarding the use of this medicine in children. Special care may be needed. Overdosage: If you think you have taken too much of this medicine contact a poison control center or emergency room at once. NOTE: This medicine is only for you. Do not share this medicine with others. What if I miss a dose? It is important not to miss your dose. Call your doctor or health care professional if you are unable to keep an appointment. What may interact with this medicine? This medicine may interact with the following medications:  bromocriptine  cyclosporine  certain medicines for blood pressure, heart disease, irregular heart beat  certain medicines for diabetes  quinidine  terfenadine This list may not describe all possible interactions. Give your health care provider a list of all the medicines, herbs, non-prescription drugs, or dietary supplements you use. Also tell them if you smoke,  drink alcohol, or use illegal drugs. Some items may interact with your medicine. What should I watch for while using this medicine? Tell your doctor or healthcare professional if your symptoms do not start to get better or if they get worse. Visit your doctor or health care professional for regular checks on your progress. Your condition will be monitored carefully while you are receiving this medicine. This medicine may increase blood sugar. Ask your healthcare provider if changes in diet or medicines are needed if you have diabetes. You may need blood work done while you are taking this medicine. Women should inform their doctor if they wish to become pregnant or think they might be pregnant. There is a potential for serious side effects to an unborn child. Talk to your health care professional or pharmacist for more information. Do not breast-feed an infant while taking this medicine or for 6 months after stopping it. This medicine has caused ovarian failure in some women. This medicine may interfere with the ability to have a child. Talk with your doctor or health care professional if you are concerned about your fertility. What side effects may I notice from receiving this medicine? Side effects that you should report to your doctor or health care professional as soon as possible:  allergic reactions like skin rash, itching or hives, swelling of the face, lips, or tongue  increased blood pressure  severe stomach pain  signs and symptoms of hgh blood sugar such as being more thirsty or hungry or having to urinate more than normal. You may also feel very tired or have blurry vision.  signs and symptoms of low blood   sugar such as feeling anxious; confusion; dizziness; increased hunger; unusually weak or tired; sweating; shakiness; cold; irritable; headache; blurred vision; fast heartbeat; loss of consciousness  unusually slow heartbeat Side effects that usually do not require medical  attention (report to your doctor or health care professional if they continue or are bothersome):  constipation  diarrhea  dizziness  headache  muscle pain  muscle spasms  nausea  pain, redness, or irritation at site where injected This list may not describe all possible side effects. Call your doctor for medical advice about side effects. You may report side effects to FDA at 1-800-FDA-1088. Where should I keep my medicine? This drug is given in a hospital or clinic and will not be stored at home. NOTE: This sheet is a summary. It may not cover all possible information. If you have questions about this medicine, talk to your doctor, pharmacist, or health care provider.  2020 Elsevier/Gold Standard (2018-07-27 09:13:08)  

## 2020-09-15 NOTE — Progress Notes (Signed)
Hematology and Oncology Follow Up Visit  Brian Mays [id number] [birth year] 74 y.o. 09/15/2020   Principle Diagnosis:  Metastatic low grade neuroendocrine tumor-hepatic metastases Iron deficiency anemia  Past Therapy: S/p yttrium-90 intrahepatic therapy - November 2016 Lutathera (Lu 177) injection on 02/01/2018 - s/p cycle 4  Current Therapy: Somatuline 120 mg monthly IV Iron as indicated    Interim History:  Brian Mays is here today for follow-up.  Overall, he is doing okay.  His wife is still having some problems.  It sounds like she is having some balance problems.  We last checked his chromogranin A level, it was 130 which is stable.  He did have a set of CT scans done on November  .Thankfully, the scans would not show anything growing.  There is some slight increase in the mesenteric mass which is calcified.  Otherwise, his liver lesions were all stable.  U  He has had no problems with diarrhea.  Has had no cough or shortness of breath.  He has had no issues with chest pain.  He has had no rashes.  There is been no leg swelling.  Currently, his performance status is ECOG 1.     Medications:  Allergies as of 09/15/2020      Reactions   Iohexol Hives    Code: HIVES, Desc: PER [name] @ [facility]IMARY [facility]ARE, PT IS ALLERGIC TO CONTRAST DYE 10/02/08/RM  05/01/10...needs full premeds per our protocol w/ [facility]., Onset Date: [id number]      Medication List       Accurate as of September 15, 2020  1:19 PM. If you have any questions, ask your nurse or doctor.        aspirin EC 81 MG tablet Take 81 mg by mouth every morning.   atorvastatin 20 MG tablet Commonly known as: LIPITOR TAKE 1 TABLET (20 MG TOTAL) BY MOUTH DAILY AT 6 PM.   Cholecalciferol 25 MCG (1000 UT) tablet Take 1,000 Units by mouth daily.   clopidogrel 75 MG tablet Commonly known as: PLAVIX TAKE 1 TABLET BY MOUTH EVERY DAY   fluticasone 50 MCG/ACT nasal spray Commonly known as: FLONASE Place 2  sprays into both nostrils daily as needed (sinuses).   hydrochlorothiazide 25 MG tablet Commonly known as: HYDRODIURIL Take 12.5 mg by mouth daily.   hydrocortisone 25 MG suppository Commonly known as: ANUSOL-HC INSERT 1 SUPPOSITORY RECTALLY TWICE A DAY AS NEEDED FOR HEMORRHOIDS   ipratropium 0.03 % nasal spray Commonly known as: Atrovent Place 2 sprays into the nose 3 (three) times daily.   lansoprazole 30 MG capsule Commonly known as: PREVACID Take 1 capsule (30 mg total) by mouth daily at 12 noon.   levothyroxine 50 MCG tablet Commonly known as: SYNTHROID Take 1 tablet (50 mcg total) by mouth daily before breakfast.   losartan 100 MG tablet Commonly known as: COZAAR TAKE 1 TABLET BY MOUTH EVERY DAY   metoprolol tartrate 25 MG tablet Commonly known as: LOPRESSOR TAKE 0.5 TABLETS (12.5 MG TOTAL) BY MOUTH 2 (TWO) TIMES DAILY.   nitroGLYCERIN 0.4 MG SL tablet Commonly known as: NITROSTAT PLACE 1 TABLET UNDER THE TONGUE EVERY 5 (FIVE) MINUTES X 3 DOSES AS NEEDED FOR CHEST PAIN.   sodium chloride 0.65 % Soln nasal spray Commonly known as: OCEAN Place 1 spray into both nostrils as needed for congestion.   SOMATULINE DEPOT [place] Inject 120 mcg into the skin every 28 (twenty-eight) days. Receives at Dr Antonieta Pert office   vitamin B-12 1000 MCG tablet Commonly  known as: CYANOCOBALAMIN Take 1,000 mcg by mouth every evening.       Allergies:  Allergies  Allergen Reactions  . Iohexol Hives     Code: HIVES, Desc: PER [name] @ [facility]IMARY [facility]ARE, PT IS ALLERGIC TO CONTRAST DYE 10/02/08/RM  05/01/10...needs full premeds per our protocol w/ [facility]., Onset Date: [id number]     Past Medical History, Surgical history, Social history, and Family History were reviewed and updated.  Review of Systems: Review of Systems  Constitutional: Negative.   HENT: Negative.   Eyes: Negative.   Respiratory: Negative.   Cardiovascular: Negative.   Gastrointestinal: Negative.   Genitourinary:  Negative.   Musculoskeletal: Negative.   Skin: Negative.   Neurological: Negative.   Endo/Heme/Allergies: Negative.   Psychiatric/Behavioral: Negative.      Physical Exam:  weight is 207 lb (93.9 kg). His oral temperature is 98.4 F (36.9 C). His blood pressure is 173/67 (abnormal) and his pulse is 62. His respiration is 16 and oxygen saturation is 100%.   Wt Readings from Last 3 Encounters:  09/15/20 207 lb (93.9 kg)  08/07/20 206 lb (93.4 kg)  07/10/20 207 lb (93.9 kg)    Physical Exam Vitals reviewed.  HENT:     Head: Normocephalic and atraumatic.  Eyes:     Pupils: Pupils are equal, round, and reactive to light.  Cardiovascular:     Rate and Rhythm: Normal rate and regular rhythm.     Heart sounds: Normal heart sounds.  Pulmonary:     Effort: Pulmonary effort is normal.     Breath sounds: Normal breath sounds.  Abdominal:     General: Bowel sounds are normal.     Palpations: Abdomen is soft.  Musculoskeletal:        General: No tenderness or deformity. Normal range of motion.     Cervical back: Normal range of motion.  Lymphadenopathy:     Cervical: No cervical adenopathy.  Skin:    General: Skin is warm and dry.     Findings: No erythema or rash.  Neurological:     Mental Status: He is alert and oriented to person, place, and time.  Psychiatric:        Behavior: Behavior normal.        Thought Content: Thought content normal.        Judgment: Judgment normal.    Lab Results  Component Value Date   WBC 7.4 09/15/2020   HGB 12.8 (L) 09/15/2020   HCT 39.8 09/15/2020   MCV 94.5 09/15/2020   PLT 153 09/15/2020   Lab Results  Component Value Date   FERRITIN 200 08/07/2020   IRON 70 08/07/2020   TIBC 293 08/07/2020   UIBC 223 08/07/2020   IRONPCTSAT 24 08/07/2020   Lab Results  Component Value Date   RBC 4.21 (L) 09/15/2020   No results found for: KPAFRELGTCHN, LAMBDASER, KAPLAMBRATIO No results found for: IGGSERUM, IGA, IGMSERUM No results found  for: Odetta Pink, SPEI   Chemistry      Component Value Date/Time   NA 139 09/15/2020 1205   NA 138 09/15/2018 0934   NA 144 10/04/2017 0904   NA 141 07/23/2016 1255   K 4.8 09/15/2020 1205   K 3.8 10/04/2017 0904   K 4.1 07/23/2016 1255   CL 102 09/15/2020 1205   CL 103 10/04/2017 0904   CO2 32 09/15/2020 1205   CO2 28 10/04/2017 0904   CO2 27 07/23/2016 1255  BUN 16 09/15/2020 1205   BUN 14 09/15/2018 0934   BUN 14 10/04/2017 0904   BUN 13.8 07/23/2016 1255   CREATININE 1.04 09/15/2020 1205   CREATININE 0.8 10/04/2017 0904   CREATININE 1.0 07/23/2016 1255      Component Value Date/Time   CALCIUM 10.0 09/15/2020 1205   CALCIUM 9.4 10/04/2017 0904   CALCIUM 9.2 07/23/2016 1255   ALKPHOS 51 09/15/2020 1205   ALKPHOS 64 10/04/2017 0904   ALKPHOS 77 07/23/2016 1255   AST 20 09/15/2020 1205   AST 23 07/23/2016 1255   ALT 14 09/15/2020 1205   ALT 22 10/04/2017 0904   ALT 13 07/23/2016 1255   BILITOT 0.7 09/15/2020 1205   BILITOT 1.13 07/23/2016 1255       Impression and Plan: Brian Mays is a very pleasant 74 yo caucasian gentleman with metastatic low-grade neuroendocrine carcinoma.  He has hepatic metastasis.  He has been through [employer] treatment.  He had relatively stable disease with this.  He currently is on Somatuline monthly.  I am glad that the CT scan was stable.  As such, we do not have to change their protocol with Somatuline.  I think the monthly dose is working well.  We will go ahead and plan to get her back in another 4 weeks.  I just hope that his poor wife is getting better.      Volanda Napoleon, MD 11/15/20211:19 PM

## 2020-09-16 ENCOUNTER — Telehealth: Payer: Self-pay

## 2020-09-16 LAB — IRON AND TIBC
Iron: 71 ug/dL (ref 42–163)
Saturation Ratios: 22 % (ref 20–55)
TIBC: 325 ug/dL (ref 202–409)
UIBC: 255 ug/dL (ref 117–376)

## 2020-09-16 LAB — FERRITIN: Ferritin: 178 ng/mL (ref 24–336)

## 2020-09-16 NOTE — Telephone Encounter (Signed)
Called pt and made f/u appts per 09/15/20 LOS/// AOM

## 2020-09-17 LAB — CHROMOGRANIN A: Chromogranin A (ng/mL): 122.1 ng/mL — ABNORMAL HIGH (ref 0.0–101.8)

## 2020-10-02 ENCOUNTER — Encounter: Payer: Self-pay | Admitting: Cardiovascular Disease

## 2020-10-02 NOTE — Progress Notes (Signed)
Patient ID: [name]; [id number]; 05/04/1946   Admit date: (Not on file) Date of Consult: 10/02/2020  Primary Care Provider: Darreld Mclean, MD Primary Cardiologist:  Brian Mays  Primary Electrophysiologist:     Problem list 1.  Coronary artery disease: Status post rotational atherectomy and stenting of the RCA 2.  Hypertension 3.  Hyperlipidemia 4.  Hypothyroidism  Patient Profile:   Brian Mays is a 74 y.o. male with a hx of HTN, , coronary artery disease with a recent episode of weakness and near syncope.  who is being seen today for the evaluation of HTN  at the request of Dr. Marin Mays. He sees Dr. Marin Mays for Carcinoid       Brian Mays is seen wife wife , Brian Mays today   Hx of coronary stenting in March 2017 .   Was started on Metoprolol, he has had intermittent weakness and dizziness/lightheadedness since that time.  He eats and drinks regularly.   Generally gets some regular exercise.  Has had some back issues recently.  He still goes to the line does some exercises that do not cause further back pain.  Dr. Marin Mays asked him to reduce his BP meds.   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  [facility] . Does have some lightheadedness after working out .  No CP or dyspnea while working out.   No synmptoms similar to his MI.    He stopped the Losartan last week He decreased the metoprolol to 12. 5 mg BID last week.   Still eats salty foods. Wife cooks from [employer] .  Eats some pretzels.   Has flushing related to Carcinoid.    Typically occurs if he drinks some beer .   Feb.  12, 2019: Doing well Gaining some weight .   Gave him the OK to walk  No CP or dyspnea   Aug. 27 ,2019:  Doing well.   Feeling better since he decreased his metoprolol in half.   September 15, 2018: Seen back today for follow-up of his coronary artery disease, hypertension,  hyperlipidemia. He increase his losartan 3 weeks ago. No CP  BP readings at home have been elevated.  Is not exercising   August 06, 2019:  Brian Mays is seen back today for follow-up of his coronary artery disease, hypertension, hyperlipidemia. BP has been well controlled at home .  No CP , is active , not doing exercise per se Tries to avoid salty foods.   April 22, 2020:  Brian Mays is seen today for follow up for his CAD, HTN, and hyperlipidemia. Several weeks ago he developed some light headedness.  Was helping a customer unload paint.  Was bending over - up and down repeatedly  Symptoms ~ c/w orthostasis   Went to the ER  Denies any chest pain .   Associated with some fatigue.  Had not skipped any meals,  May have been a bit volume depleted. ( had walked 5 miles the day before )  Labs looked ok .   Troponin were negative x 2.  ECG was normal   Dec. 3, 2021: Brian Mays is seen back today for follow up of his CAD, HTN, HLD Had some orthostasis prior to his previous visit  We reduced  his HCTZ at his last visit  Still eats salty foods regularly  Is not exercising  regularly .   Past Medical History:  Diagnosis Date  . Anemia    in past  . Colon polyps   . Diverticulosis   . Gallstones   . GERD (gastroesophageal reflux disease)   . Heart attack (Brian Mays)    mild, Spring 2017  . Heart murmur   . Hemorrhoid   . Hiatal hernia   . HTN (hypertension)   . Hypothyroidism   . Iron deficiency anemia due to chronic blood loss 03/29/2019  . 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 Metastatic carcinoma ([place]) 2010   Dr Brian Mays  . Pancreatitis 1998   chronic    Past Surgical History:  Procedure Laterality Date  . APPENDECTOMY  1962  . CARDIAC CATHETERIZATION N/A 02/10/2016   Procedure: Left Heart Cath and Coronary Angiography;  Surgeon: Brian Prows, MD;  Location: [facility];  Service: Cardiovascular;  Laterality: N/A;  . CARDIAC CATHETERIZATION  02/10/2016   Procedure: Coronary/Graft  Atherectomy;  Surgeon: Brian Prows, MD;  Location: [facility];  Service: Cardiovascular;;  . CARDIAC CATHETERIZATION  02/10/2016   Procedure: Coronary Stent Intervention;  Surgeon: Brian Prows, MD;  Location: [facility];  Service: Cardiovascular;;  . CATARACT EXTRACTION W/ INTRAOCULAR LENS  IMPLANT, BILATERAL Bilateral   . IR GENERIC HISTORICAL  12/30/2015   IR RADIOLOGIST EVAL & MGMT 12/30/2015 Brian Edouard, MD [facility]  . IR GENERIC HISTORICAL  11/09/2016   IR RADIOLOGIST EVAL & MGMT 11/09/2016 Brian Edouard, MD [facility]  . IR RADIOLOGIST EVAL & MGMT  03/01/2017  . IR RADIOLOGIST EVAL & MGMT  06/29/2017  . LAPAROSCOPIC CHOLECYSTECTOMY  1999  . LIVER BIOPSY  2010  . RADIOACTIVE SEED IMPLANT  X 3   "to my liver"  . TUMOR EXCISION  01/2009   Carcinoil Resection   . TUMOR REMOVAL     from small intestine     Home Medications:  Prior to Admission medications   Medication Sig Start Date End Date Taking? Authorizing Provider  amLODipine (NORVASC) 5 MG tablet Take 5 mg by mouth daily. 03/16/17  Yes [provider]  aspirin EC 81 MG tablet Take 81 mg by mouth every morning.   Yes [provider]  atorvastatin (LIPITOR) 20 MG tablet Take 20 mg daily by mouth.   Yes [provider]  Cholecalciferol 1000 UNITS tablet Take 1,000 Units by mouth daily.     Yes [provider]  fluticasone (FLONASE) 50 MCG/ACT nasal spray Place 2 sprays into both nostrils daily as needed (sinuses).  01/06/15  Yes [provider]  hydrocortisone (ANUSOL-HC) 25 MG suppository INSERT 1 SUPPOSITORY RECTALLY TWICE A DAY AS NEEDED FOR HEMORRHOIDS 02/07/15  Yes Brian Mays, Brian Humbles, NP  Lanreotide Acetate (SOMATULINE DEPOT [place]) Inject 120 mcg into the skin every 28 (twenty-eight) days. Receives at Dr Brian Mays office   Yes [provider]  lansoprazole (PREVACID) 30 MG capsule Take 1 capsule (30 mg total) by mouth daily at 12 noon. 11/19/16  Yes Brian Mays,  Brian Filler, MD  levothyroxine (SYNTHROID, LEVOTHROID) 50 MCG tablet Take 1 tablet (50 mcg total) by mouth daily. 08/09/17  Yes Brian Mays, Brian Filler, MD  naproxen (NAPROSYN) 500 MG tablet Take 1 tablet (500 mg total) by mouth 2 (two) times daily with a meal. 08/16/17  Yes Leandrew Koyanagi, MD  nitroGLYCERIN (NITROSTAT) 0.4 MG SL tablet Place 1 tablet (0.4 mg total) under the tongue every 5 (five) minutes x 3 doses as needed for chest pain. 02/11/16  Yes Brian Prows, MD  Omega-3 Fatty Acids (FISH OIL) 1000 MG CAPS Take 1 capsule by mouth daily. Reported on 12/30/2015   Yes [provider]  predniSONE (DELTASONE) 50 MG tablet Take 50mg  at 13 hours, 7 hours, and 1 hour before scan 02/18/17  Yes Brian Mays, Brian Humbles, NP  sodium chloride (OCEAN) 0.65 % SOLN nasal spray Place 1 spray into both nostrils as needed for congestion.    Yes [provider]  ticagrelor (BRILINTA) 90 MG TABS tablet Take 1 tablet (90 mg total) by mouth 2 (two) times daily. 02/11/16  Yes Brian Prows, MD  tiZANidine (ZANAFLEX) 4 MG tablet Take 1 tablet (4 mg total) by mouth every 6 (six) hours as needed for muscle spasms. 08/16/17  Yes Leandrew Koyanagi, MD  vitamin B-12 (CYANOCOBALAMIN) 1000 MCG tablet Take 1,000 mcg by mouth daily.   Yes [provider]  diphenhydrAMINE (BENADRYL) 50 MG tablet Take 1 tablet (50 mg total) by mouth once. Take 1 hour prior to scan. 02/18/17 02/18/17  Brian Mays, Brian Humbles, NP  metoprolol tartrate (LOPRESSOR) 25 MG tablet Take 0.5 tablets (12.5 mg total) 2 (two) times daily by mouth. 09/14/17   Camiyah Friberg, Wonda Cheng, MD     Allergies:    Allergies  Allergen Reactions  . Iohexol Hives     Code: HIVES, Desc: PER [name] @ PRIMARY CARE, PT IS ALLERGIC TO CONTRAST DYE 10/02/08/RM  05/01/10...needs full premeds per our protocol w/ [facility]., Onset Date: [id number]     Social History:   Social History   Socioeconomic History  . Marital status: Married    Spouse name: Not on file  . Number of children:  1  . Years of education: Not on file  . Highest education level: Not on file  Occupational History  . Occupation: Scientist, clinical (histocompatibility and immunogenetics): [employer]  Tobacco Use  . Smoking status: Former Smoker    Packs/day: 1.50    Years: 35.00    Pack years: 52.50    Types: Cigarettes    Start date: 09/25/1959    Quit date: 12/21/1997    Years since quitting: 22.7  . Smokeless tobacco: Never Used  Vaping Use  . Vaping Use: Never used  Substance and Sexual Activity  . Alcohol use: Yes    Alcohol/week: 3.0 - 4.0 standard drinks    Types: 3 - 4 Cans of beer per week    Comment: 4 beers weekly   . Drug use: No  . Sexual activity: Yes  Other Topics Concern  . Not on file  Social History Narrative   Regular Exercise -  NO   Social Determinants of Health   Financial Resource Strain:   . Difficulty of Paying Living Expenses: Not on file  Food Insecurity:   . Worried About Charity fundraiser in the Last Year: Not on file  . Ran Out of Food in the Last Year: Not on file  Transportation Needs:   . Lack of Transportation (Medical): Not on file  . Lack of Transportation (Non-Medical): Not on file  Physical Activity:   . Days of Exercise per Week: Not on file  . Minutes of Exercise per Session: Not on file  Stress:   . Feeling of Stress : Not on file  Social Connections:   . Frequency of Communication with Friends and Family: Not on file  . Frequency of Social Gatherings with Friends and Family: Not on file  . Attends Religious Services: Not on  file  . Active Member of Clubs or Organizations: Not on file  . Attends Archivist Meetings: Not on file  . Marital Status: Not on file  Intimate Partner Violence:   . Fear of Current or Ex-Partner: Not on file  . Emotionally Abused: Not on file  . Physically Abused: Not on file  . Sexually Abused: Not on file    Family History:    Family History  Problem Relation Age of Onset  . Kidney disease Mother   . Hyperlipidemia  Mother   . Hypertension Mother   . COPD Father   . Ulcerative colitis Daughter      ROS:  Noted in current history, all other systems are negative.   Physical Exam: There were no vitals taken for this visit.  GEN:  Well nourished, well developed in no acute distress HEENT: Normal NECK: No JVD; No carotid bruits LYMPHATICS: No lymphadenopathy CARDIAC: RRR soft systolic murmur  RESPIRATORY:  Clear to auscultation without rales, wheezing or rhonchi  ABDOMEN: Soft, non-tender, non-distended MUSCULOSKELETAL:  No edema; No deformity  SKIN: Warm and dry NEUROLOGIC:  Alert and oriented x 3    EKG:       Relevant CV Studies:    Laboratory Data:  ChemistryNo results for input(s): NA, K, CL, CO2, GLUCOSE, BUN, CREATININE, CALCIUM, GFRNONAA, GFRAA, ANIONGAP in the last 168 hours.  No results for input(s): PROT, ALBUMIN, AST, ALT, ALKPHOS, BILITOT in the last 168 hours. HematologyNo results for input(s): WBC, RBC, HGB, HCT, MCV, MCH, MCHC, RDW, PLT in the last 168 hours. Cardiac EnzymesNo results for input(s): TROPONINI in the last 168 hours. No results for input(s): TROPIPOC in the last 168 hours.  BNPNo results for input(s): BNP, PROBNP in the last 168 hours.  DDimer No results for input(s): DDIMER in the last 168 hours.  Radiology/Studies:  No results found.  Assessment and Plan:     1.  Coronary artery disease:    No angina      2.  Near syncope:    3.  Hypertension:       His BP is elevated on occasion.  He admits to eating salty foods on a regular basis.  I encouraged him to watch his salt.  He will need to start exercising on a regular basis.   4.  Hyperlipidemia: Labs from September, 2021 look good. Total cholesterol is 106 HDL 37 LDL is 45 Triglyceride levels 158.        For questions or updates, please contact [place] Please consult [email or web address] for contact info under Cardiology/STEMI.   Signed, Mertie Moores, MD  10/02/2020 8:36 PM

## 2020-10-03 ENCOUNTER — Encounter: Payer: Self-pay | Admitting: Cardiovascular Disease

## 2020-10-03 ENCOUNTER — Other Ambulatory Visit: Payer: Self-pay

## 2020-10-03 ENCOUNTER — Ambulatory Visit: Payer: Medicare HMO | Admitting: Cardiovascular Disease

## 2020-10-03 VITALS — BP 112/64 | HR 66 | Ht 70.5 in | Wt 206.0 lb

## 2020-10-03 DIAGNOSIS — I2510 Atherosclerotic heart disease of native coronary artery without angina pectoris: Secondary | ICD-10-CM

## 2020-10-03 DIAGNOSIS — I10 Essential (primary) hypertension: Secondary | ICD-10-CM

## 2020-10-03 NOTE — Patient Instructions (Signed)
Medication Instructions:  Your physician recommends that you continue on your current medications as directed. Please refer to the Current Medication list given to you today.  *If you need a refill on your cardiac medications before your next appointment, please call your pharmacy*   Follow-Up: At [facility], you and your health needs are our priority.  As part of our continuing mission to provide you with exceptional heart care, we have created designated Provider Care Teams.  These Care Teams include your primary Cardiologist (physician) and Advanced Practice Providers (APPs -  Physician Assistants and Nurse Practitioners) who all work together to provide you with the care you need, when you need it.  Your next appointment:   1 year(s)  The format for your next appointment:   In Person  Provider:   You may see Philip Nahser, MD or one of the following Advanced Practice Providers on your designated Care Team:   Scott Weaver, PA-C Vin Bhagat, PA-C   

## 2020-10-06 ENCOUNTER — Other Ambulatory Visit: Payer: Self-pay | Admitting: Cardiovascular Disease

## 2020-10-13 ENCOUNTER — Encounter: Payer: Self-pay | Admitting: Family

## 2020-10-13 ENCOUNTER — Inpatient Hospital Stay (HOSPITAL_BASED_OUTPATIENT_CLINIC_OR_DEPARTMENT_OTHER): Payer: Medicare HMO | Admitting: Family

## 2020-10-13 ENCOUNTER — Other Ambulatory Visit: Payer: Self-pay

## 2020-10-13 ENCOUNTER — Inpatient Hospital Stay: Payer: Medicare HMO

## 2020-10-13 ENCOUNTER — Inpatient Hospital Stay: Payer: Medicare HMO | Attending: Hematology & Oncology

## 2020-10-13 ENCOUNTER — Telehealth: Payer: Self-pay | Admitting: Family

## 2020-10-13 VITALS — BP 120/56 | HR 70 | Temp 98.0°F | Resp 18 | Ht 70.5 in | Wt 208.4 lb

## 2020-10-13 DIAGNOSIS — C7B8 Other secondary neuroendocrine tumors: Secondary | ICD-10-CM

## 2020-10-13 DIAGNOSIS — D500 Iron deficiency anemia secondary to blood loss (chronic): Secondary | ICD-10-CM

## 2020-10-13 DIAGNOSIS — C7A8 Other malignant neuroendocrine tumors: Secondary | ICD-10-CM | POA: Insufficient documentation

## 2020-10-13 LAB — CMP (CANCER CENTER ONLY)
ALT: 15 U/L (ref 0–44)
AST: 21 U/L (ref 15–41)
Albumin: 4 g/dL (ref 3.5–5.0)
Alkaline Phosphatase: 54 U/L (ref 38–126)
Anion gap: 6 (ref 5–15)
BUN: 18 mg/dL (ref 8–23)
CO2: 32 mmol/L (ref 22–32)
Calcium: 9.7 mg/dL (ref 8.9–10.3)
Chloride: 101 mmol/L (ref 98–111)
Creatinine: 1.11 mg/dL (ref 0.61–1.24)
GFR, Estimated: >60 mL/min (ref >60)
Glucose, Bld: 128 mg/dL — ABNORMAL HIGH (ref 70–99)
Potassium: 4.7 mmol/L (ref 3.5–5.1)
Sodium: 139 mmol/L (ref 135–145)
Total Bilirubin: 0.5 mg/dL (ref 0.3–1.2)
Total Protein: 7 g/dL (ref 6.5–8.1)

## 2020-10-13 LAB — CBC WITH DIFFERENTIAL (CANCER CENTER ONLY)
Abs Immature Granulocytes: 0.03 10*3/uL (ref 0.00–0.07)
Basophils Absolute: 0 10*3/uL (ref 0.0–0.1)
Basophils Relative: 1 %
Eosinophils Absolute: 0.4 10*3/uL (ref 0.0–0.5)
Eosinophils Relative: 6 %
HCT: 39.7 % (ref 39.0–52.0)
Hemoglobin: 12.9 g/dL — ABNORMAL LOW (ref 13.0–17.0)
Immature Granulocytes: 1 %
Lymphocytes Relative: 18 %
Lymphs Abs: 1.1 10*3/uL (ref 0.7–4.0)
MCH: 30.4 pg (ref 26.0–34.0)
MCHC: 32.5 g/dL (ref 30.0–36.0)
MCV: 93.4 fL (ref 80.0–100.0)
Monocytes Absolute: 0.4 10*3/uL (ref 0.1–1.0)
Monocytes Relative: 7 %
Neutro Abs: 4.2 10*3/uL (ref 1.7–7.7)
Neutrophils Relative %: 67 %
Platelet Count: 145 10*3/uL — ABNORMAL LOW (ref 150–400)
RBC: 4.25 MIL/uL (ref 4.22–5.81)
RDW: 13.6 % (ref 11.5–15.5)
WBC Count: 6.2 10*3/uL (ref 4.0–10.5)
nRBC: 0 % (ref 0.0–0.2)

## 2020-10-13 LAB — LACTATE DEHYDROGENASE: LDH: 169 U/L (ref 98–192)

## 2020-10-13 MED ORDER — LANREOTIDE ACETATE 120 MG/0.5ML ~~LOC~~ SOLN
120.0000 mg | Freq: Once | SUBCUTANEOUS | Status: AC
Start: 2020-10-13 — End: 2020-10-13
  Administered 2020-10-13: 120 mg via SUBCUTANEOUS

## 2020-10-13 MED ORDER — LANREOTIDE ACETATE 120 MG/0.5ML ~~LOC~~ SOLN
SUBCUTANEOUS | Status: AC
  Filled 2020-10-13: qty 120

## 2020-10-13 NOTE — Progress Notes (Signed)
Hematology and Oncology Follow Up Visit  Brian Mays [id number] 1946/08/14 74 y.o. 10/13/2020   Principle Diagnosis:  Metastatic low grade neuroendocrine tumor-hepatic metastases Iron deficiencyanemia  Past Therapy: S/p yttrium-90 intrahepatic therapy - November 2016 Lutathera (Lu 177) injection on 02/01/2018 - s/p cycle 4  Current Therapy: Somatuline 120 mg monthly IV Ironas indicated   Interim History:  [name] is here today for follow-up and injection. He is doing quite well and has no complaints at this time.  He is golfing once a week and enjoying the nice weather in December.  Chromogranin A last month was 122.  He has had no fever, chills, n/v, cough, rash, dizziness, SOB, chest pain, palpitations, abdominal pain or changes in bowel or bladder habits.  He has not noted any blood loss. No abnormal bruising or petechiae.  No swelling, tenderness, numbness or tingling in his extremities. Pedal pulses are 2+.   No falls or syncope.  He has maintained a good appetite and is staying well hydrated. His weight is stable.   ECOG Performance Status: 1 - Symptomatic but completely ambulatory  Medications:  Allergies as of 10/13/2020      Reactions   Iohexol Hives    Code: HIVES, Desc: PER [name] @ PRIMARY CARE, PT IS ALLERGIC TO CONTRAST DYE 10/02/08/RM  05/01/10...needs full premeds per our protocol w/ [facility]., Onset Date: [id number]      Medication List       Accurate as of October 13, 2020  2:01 PM. If you have any questions, ask your nurse or doctor.        aspirin EC 81 MG tablet Take 81 mg by mouth every morning.   atorvastatin 20 MG tablet Commonly known as: LIPITOR TAKE 1 TABLET (20 MG TOTAL) BY MOUTH DAILY AT 6 PM.   Cholecalciferol 25 MCG (1000 UT) tablet Take 1,000 Units by mouth daily.   clopidogrel 75 MG tablet Commonly known as: PLAVIX TAKE 1 TABLET BY MOUTH EVERY DAY   fluticasone 50 MCG/ACT nasal spray Commonly known as:  FLONASE Place 2 sprays into both nostrils daily as needed (sinuses).   hydrochlorothiazide 25 MG tablet Commonly known as: HYDRODIURIL TAKE 1/2 TABLET BY MOUTH EVERY DAY   hydrocortisone 25 MG suppository Commonly known as: ANUSOL-HC INSERT 1 SUPPOSITORY RECTALLY TWICE A DAY AS NEEDED FOR HEMORRHOIDS   ipratropium 0.03 % nasal spray Commonly known as: Atrovent Place 2 sprays into the nose 3 (three) times daily.   lansoprazole 30 MG capsule Commonly known as: PREVACID Take 1 capsule (30 mg total) by mouth daily at 12 noon.   levothyroxine 50 MCG tablet Commonly known as: SYNTHROID Take 1 tablet (50 mcg total) by mouth daily before breakfast.   losartan 100 MG tablet Commonly known as: COZAAR TAKE 1 TABLET BY MOUTH EVERY DAY   metoprolol tartrate 25 MG tablet Commonly known as: LOPRESSOR TAKE 0.5 TABLETS (12.5 MG TOTAL) BY MOUTH 2 (TWO) TIMES DAILY.   nitroGLYCERIN 0.4 MG SL tablet Commonly known as: NITROSTAT PLACE 1 TABLET UNDER THE TONGUE EVERY 5 (FIVE) MINUTES X 3 DOSES AS NEEDED FOR CHEST PAIN.   sodium chloride 0.65 % Soln nasal spray Commonly known as: OCEAN Place 1 spray into both nostrils as needed for congestion.   SOMATULINE DEPOT [place] Inject 120 mcg into the skin every 28 (twenty-eight) days. Receives at Dr Antonieta Pert office   vitamin B-12 1000 MCG tablet Commonly known as: CYANOCOBALAMIN Take 1,000 mcg by mouth every evening.  Allergies:  Allergies  Allergen Reactions  . Iohexol Hives     Code: HIVES, Desc: PER [name] @ [facility]IMARY CARE, PT IS ALLERGIC TO CONTRAST DYE 10/02/08/RM  05/01/10...needs full premeds per our protocol w/ [facility]., Onset Date: [id number]     Past Medical History, Surgical history, Social history, and Family History were reviewed and updated.  Review of Systems: All other 10 point review of systems is negative.   Physical Exam:  vitals were not taken for this visit.   Wt Readings from Last 3 Encounters:  10/03/20 206 lb  (93.4 kg)  09/15/20 207 lb (93.9 kg)  08/07/20 206 lb (93.4 kg)    Ocular: Sclerae unicteric, pupils equal, round and reactive to light Ear-nose-throat: Oropharynx clear, dentition fair Lymphatic: No cervical or supraclavicular adenopathy Lungs no rales or rhonchi, good excursion bilaterally Heart regular rate and rhythm, no murmur appreciated Abd soft, nontender, positive bowel sounds MSK no focal spinal tenderness, no joint edema Neuro: non-focal, well-oriented, appropriate affect Breasts: Deferred   Lab Results  Component Value Date   WBC 6.2 10/13/2020   HGB 12.9 (L) 10/13/2020   HCT 39.7 10/13/2020   MCV 93.4 10/13/2020   PLT 145 (L) 10/13/2020   Lab Results  Component Value Date   FERRITIN 178 09/15/2020   IRON 71 09/15/2020   TIBC 325 09/15/2020   UIBC 255 09/15/2020   IRONPCTSAT 22 09/15/2020   Lab Results  Component Value Date   RBC 4.25 10/13/2020   No results found for: KPAFRELGTCHN, LAMBDASER, KAPLAMBRATIO No results found for: IGGSERUM, IGA, IGMSERUM No results found for: Odetta Pink, SPEI   Chemistry      Component Value Date/Time   NA 139 09/15/2020 1205   NA 138 09/15/2018 0934   NA 144 10/04/2017 0904   NA 141 07/23/2016 1255   K 4.8 09/15/2020 1205   K 3.8 10/04/2017 0904   K 4.1 07/23/2016 1255   CL 102 09/15/2020 1205   CL 103 10/04/2017 0904   CO2 32 09/15/2020 1205   CO2 28 10/04/2017 0904   CO2 27 07/23/2016 1255   BUN 16 09/15/2020 1205   BUN 14 09/15/2018 0934   BUN 14 10/04/2017 0904   BUN 13.8 07/23/2016 1255   CREATININE 1.04 09/15/2020 1205   CREATININE 0.8 10/04/2017 0904   CREATININE 1.0 07/23/2016 1255      Component Value Date/Time   CALCIUM 10.0 09/15/2020 1205   CALCIUM 9.4 10/04/2017 0904   CALCIUM 9.2 07/23/2016 1255   ALKPHOS 51 09/15/2020 1205   ALKPHOS 64 10/04/2017 0904   ALKPHOS 77 07/23/2016 1255   AST 20 09/15/2020 1205   AST 23 07/23/2016 1255   ALT  14 09/15/2020 1205   ALT 22 10/04/2017 0904   ALT 13 07/23/2016 1255   BILITOT 0.7 09/15/2020 1205   BILITOT 1.13 07/23/2016 1255       Impression and Plan: Mr. Hawker isa very pleasant 74 yo caucasian gentleman withmetastatic low-grade neuroendocrine carcinoma with hepatic metastasis. We will proceed with Somatuline injection today as planned.  Iron studies are pending. We can replace if needed.  Follow-up in 1 month.  He will contact our office with any questions or concerns. We can certainly see him sooner if needed.   Laverna Peace, NP 12/13/20212:01 PM

## 2020-10-13 NOTE — Progress Notes (Signed)
Pt discharged in no apparent distress. Pt left ambulatory without assistance. Pt aware of discharge instructions and verbalized understanding and had no further questions.  

## 2020-10-13 NOTE — Telephone Encounter (Signed)
Appointments scheduled calendar printed per 12/13 los 

## 2020-10-13 NOTE — Patient Instructions (Signed)
Lanreotide injection What is this medicine? LANREOTIDE (lan REE oh tide) is used to reduce blood levels of growth hormone in patients with a condition called acromegaly. It also works to slow or stop tumor growth in patients with neuroendocrine tumors and treat carcinoid syndrome. This medicine may be used for other purposes; ask your health care provider or pharmacist if you have questions. COMMON BRAND NAME(S): Somatuline [place] What should I tell my health care provider before I take this medicine? They need to know if you have any of these conditions:  diabetes  gallbladder disease  heart disease  kidney disease  liver disease  thyroid disease  an unusual or allergic reaction to lanreotide, other medicines, foods, dyes, or preservatives  pregnant or trying to get pregnant  breast-feeding How should I use this medicine? This medicine is for injection under the skin. It is given by a health care professional in a hospital or clinic setting. Contact your pediatrician or health care professional regarding the use of this medicine in children. Special care may be needed. Overdosage: If you think you have taken too much of this medicine contact a poison control center or emergency room at once. NOTE: This medicine is only for you. Do not share this medicine with others. What if I miss a dose? It is important not to miss your dose. Call your doctor or health care professional if you are unable to keep an appointment. What may interact with this medicine? This medicine may interact with the following medications:  bromocriptine  cyclosporine  certain medicines for blood pressure, heart disease, irregular heart beat  certain medicines for diabetes  quinidine  terfenadine This list may not describe all possible interactions. Give your health care provider a list of all the medicines, herbs, non-prescription drugs, or dietary supplements you use. Also tell them if you smoke,  drink alcohol, or use illegal drugs. Some items may interact with your medicine. What should I watch for while using this medicine? Tell your doctor or healthcare professional if your symptoms do not start to get better or if they get worse. Visit your doctor or health care professional for regular checks on your progress. Your condition will be monitored carefully while you are receiving this medicine. This medicine may increase blood sugar. Ask your healthcare provider if changes in diet or medicines are needed if you have diabetes. You may need blood work done while you are taking this medicine. Women should inform their doctor if they wish to become pregnant or think they might be pregnant. There is a potential for serious side effects to an unborn child. Talk to your health care professional or pharmacist for more information. Do not breast-feed an infant while taking this medicine or for 6 months after stopping it. This medicine has caused ovarian failure in some women. This medicine may interfere with the ability to have a child. Talk with your doctor or health care professional if you are concerned about your fertility. What side effects may I notice from receiving this medicine? Side effects that you should report to your doctor or health care professional as soon as possible:  allergic reactions like skin rash, itching or hives, swelling of the face, lips, or tongue  increased blood pressure  severe stomach pain  signs and symptoms of hgh blood sugar such as being more thirsty or hungry or having to urinate more than normal. You may also feel very tired or have blurry vision.  signs and symptoms of low blood   sugar such as feeling anxious; confusion; dizziness; increased hunger; unusually weak or tired; sweating; shakiness; cold; irritable; headache; blurred vision; fast heartbeat; loss of consciousness  unusually slow heartbeat Side effects that usually do not require medical  attention (report to your doctor or health care professional if they continue or are bothersome):  constipation  diarrhea  dizziness  headache  muscle pain  muscle spasms  nausea  pain, redness, or irritation at site where injected This list may not describe all possible side effects. Call your doctor for medical advice about side effects. You may report side effects to FDA at 1-800-FDA-1088. Where should I keep my medicine? This drug is given in a hospital or clinic and will not be stored at home. NOTE: This sheet is a summary. It may not cover all possible information. If you have questions about this medicine, talk to your doctor, pharmacist, or health care provider.  2020 Elsevier/Gold Standard (2018-07-27 09:13:08)  

## 2020-10-14 LAB — CHROMOGRANIN A: Chromogranin A (ng/mL): 123.5 ng/mL — ABNORMAL HIGH (ref 0.0–101.8)

## 2020-10-14 LAB — IRON AND TIBC
Iron: 75 ug/dL (ref 42–163)
Saturation Ratios: 25 % (ref 20–55)
TIBC: 303 ug/dL (ref 202–409)
UIBC: 227 ug/dL (ref 117–376)

## 2020-10-14 LAB — FERRITIN: Ferritin: 150 ng/mL (ref 24–336)

## 2020-10-15 ENCOUNTER — Encounter: Payer: Self-pay | Admitting: Family Medicine

## 2020-11-08 ENCOUNTER — Encounter: Payer: Self-pay | Admitting: Hematology & Oncology

## 2020-11-13 ENCOUNTER — Inpatient Hospital Stay: Payer: Medicare HMO | Admitting: Family

## 2020-11-13 ENCOUNTER — Inpatient Hospital Stay: Payer: Medicare HMO

## 2020-11-17 ENCOUNTER — Telehealth: Payer: Self-pay

## 2020-11-17 ENCOUNTER — Inpatient Hospital Stay: Payer: Medicare HMO | Attending: Hematology & Oncology

## 2020-11-17 ENCOUNTER — Inpatient Hospital Stay: Payer: Medicare HMO

## 2020-11-17 ENCOUNTER — Other Ambulatory Visit: Payer: Self-pay

## 2020-11-17 ENCOUNTER — Inpatient Hospital Stay (HOSPITAL_BASED_OUTPATIENT_CLINIC_OR_DEPARTMENT_OTHER): Payer: Medicare HMO | Admitting: Family

## 2020-11-17 ENCOUNTER — Telehealth: Payer: Self-pay | Admitting: *Deleted

## 2020-11-17 ENCOUNTER — Encounter: Payer: Self-pay | Admitting: Family

## 2020-11-17 VITALS — BP 160/65 | HR 70 | Temp 97.7°F | Resp 18 | Ht 70.5 in | Wt 210.0 lb

## 2020-11-17 DIAGNOSIS — D500 Iron deficiency anemia secondary to blood loss (chronic): Secondary | ICD-10-CM

## 2020-11-17 DIAGNOSIS — C7B8 Other secondary neuroendocrine tumors: Secondary | ICD-10-CM | POA: Diagnosis not present

## 2020-11-17 DIAGNOSIS — D509 Iron deficiency anemia, unspecified: Secondary | ICD-10-CM | POA: Insufficient documentation

## 2020-11-17 DIAGNOSIS — C7A8 Other malignant neuroendocrine tumors: Secondary | ICD-10-CM | POA: Diagnosis not present

## 2020-11-17 LAB — CBC WITH DIFFERENTIAL (CANCER CENTER ONLY)
Abs Immature Granulocytes: 0.03 10*3/uL (ref 0.00–0.07)
Basophils Absolute: 0 10*3/uL (ref 0.0–0.1)
Basophils Relative: 1 %
Eosinophils Absolute: 0.2 10*3/uL (ref 0.0–0.5)
Eosinophils Relative: 4 %
HCT: 39.8 % (ref 39.0–52.0)
Hemoglobin: 12.9 g/dL — ABNORMAL LOW (ref 13.0–17.0)
Immature Granulocytes: 1 %
Lymphocytes Relative: 16 %
Lymphs Abs: 1 10*3/uL (ref 0.7–4.0)
MCH: 30.1 pg (ref 26.0–34.0)
MCHC: 32.4 g/dL (ref 30.0–36.0)
MCV: 92.8 fL (ref 80.0–100.0)
Monocytes Absolute: 0.5 10*3/uL (ref 0.1–1.0)
Monocytes Relative: 7 %
Neutro Abs: 4.7 10*3/uL (ref 1.7–7.7)
Neutrophils Relative %: 71 %
Platelet Count: 153 10*3/uL (ref 150–400)
RBC: 4.29 MIL/uL (ref 4.22–5.81)
RDW: 13.6 % (ref 11.5–15.5)
WBC Count: 6.5 10*3/uL (ref 4.0–10.5)
nRBC: 0 % (ref 0.0–0.2)

## 2020-11-17 LAB — CMP (CANCER CENTER ONLY)
ALT: 15 U/L (ref 0–44)
AST: 26 U/L (ref 15–41)
Albumin: 4.1 g/dL (ref 3.5–5.0)
Alkaline Phosphatase: 48 U/L (ref 38–126)
Anion gap: 7 (ref 5–15)
BUN: 16 mg/dL (ref 8–23)
CO2: 31 mmol/L (ref 22–32)
Calcium: 9.8 mg/dL (ref 8.9–10.3)
Chloride: 99 mmol/L (ref 98–111)
Creatinine: 1.07 mg/dL (ref 0.61–1.24)
GFR, Estimated: >60 mL/min (ref >60)
Glucose, Bld: 95 mg/dL (ref 70–99)
Potassium: 4.3 mmol/L (ref 3.5–5.1)
Sodium: 137 mmol/L (ref 135–145)
Total Bilirubin: 0.8 mg/dL (ref 0.3–1.2)
Total Protein: 7.4 g/dL (ref 6.5–8.1)

## 2020-11-17 LAB — IRON AND TIBC
Iron: 60 ug/dL (ref 45–182)
Saturation Ratios: 18 % (ref 17.9–39.5)
TIBC: 338 ug/dL (ref 250–450)
UIBC: 278 ug/dL

## 2020-11-17 LAB — LACTATE DEHYDROGENASE: LDH: 177 U/L (ref 98–192)

## 2020-11-17 LAB — FERRITIN: Ferritin: 88 ng/mL (ref 24–336)

## 2020-11-17 MED ORDER — LANREOTIDE ACETATE 120 MG/0.5ML ~~LOC~~ SOLN
SUBCUTANEOUS | Status: AC
  Filled 2020-11-17: qty 120

## 2020-11-17 MED ORDER — LANREOTIDE ACETATE 120 MG/0.5ML ~~LOC~~ SOLN
120.0000 mg | Freq: Once | SUBCUTANEOUS | Status: AC
  Administered 2020-11-17: 120 mg via SUBCUTANEOUS

## 2020-11-17 NOTE — Telephone Encounter (Signed)
Called pt on mobile and home#, no answer. lmovm on both numbers

## 2020-11-17 NOTE — Patient Instructions (Signed)
Lanreotide injection What is this medicine? LANREOTIDE (lan REE oh tide) is used to reduce blood levels of growth hormone in patients with a condition called acromegaly. It also works to slow or stop tumor growth in patients with neuroendocrine tumors and treat carcinoid syndrome. This medicine may be used for other purposes; ask your health care provider or pharmacist if you have questions. COMMON BRAND NAME(S): Somatuline [place] What should I tell my health care provider before I take this medicine? They need to know if you have any of these conditions:  diabetes  gallbladder disease  heart disease  kidney disease  liver disease  thyroid disease  an unusual or allergic reaction to lanreotide, other medicines, foods, dyes, or preservatives  pregnant or trying to get pregnant  breast-feeding How should I use this medicine? This medicine is for injection under the skin. It is given by a health care professional in a hospital or clinic setting. Contact your pediatrician or health care professional regarding the use of this medicine in children. Special care may be needed. Overdosage: If you think you have taken too much of this medicine contact a poison control center or emergency room at once. NOTE: This medicine is only for you. Do not share this medicine with others. What if I miss a dose? It is important not to miss your dose. Call your doctor or health care professional if you are unable to keep an appointment. What may interact with this medicine? This medicine may interact with the following medications:  bromocriptine  cyclosporine  certain medicines for blood pressure, heart disease, irregular heart beat  certain medicines for diabetes  quinidine  terfenadine This list may not describe all possible interactions. Give your health care provider a list of all the medicines, herbs, non-prescription drugs, or dietary supplements you use. Also tell them if you smoke,  drink alcohol, or use illegal drugs. Some items may interact with your medicine. What should I watch for while using this medicine? Tell your doctor or healthcare professional if your symptoms do not start to get better or if they get worse. Visit your doctor or health care professional for regular checks on your progress. Your condition will be monitored carefully while you are receiving this medicine. This medicine may increase blood sugar. Ask your healthcare provider if changes in diet or medicines are needed if you have diabetes. You may need blood work done while you are taking this medicine. Women should inform their doctor if they wish to become pregnant or think they might be pregnant. There is a potential for serious side effects to an unborn child. Talk to your health care professional or pharmacist for more information. Do not breast-feed an infant while taking this medicine or for 6 months after stopping it. This medicine has caused ovarian failure in some women. This medicine may interfere with the ability to have a child. Talk with your doctor or health care professional if you are concerned about your fertility. What side effects may I notice from receiving this medicine? Side effects that you should report to your doctor or health care professional as soon as possible:  allergic reactions like skin rash, itching or hives, swelling of the face, lips, or tongue  increased blood pressure  severe stomach pain  signs and symptoms of hgh blood sugar such as being more thirsty or hungry or having to urinate more than normal. You may also feel very tired or have blurry vision.  signs and symptoms of low blood   sugar such as feeling anxious; confusion; dizziness; increased hunger; unusually weak or tired; sweating; shakiness; cold; irritable; headache; blurred vision; fast heartbeat; loss of consciousness  unusually slow heartbeat Side effects that usually do not require medical  attention (report to your doctor or health care professional if they continue or are bothersome):  constipation  diarrhea  dizziness  headache  muscle pain  muscle spasms  nausea  pain, redness, or irritation at site where injected This list may not describe all possible side effects. Call your doctor for medical advice about side effects. You may report side effects to FDA at 1-800-FDA-1088. Where should I keep my medicine? This drug is given in a hospital or clinic and will not be stored at home. NOTE: This sheet is a summary. It may not cover all possible information. If you have questions about this medicine, talk to your doctor, pharmacist, or health care provider.  2020 Elsevier/Gold Standard (2018-07-27 09:13:08)  

## 2020-11-17 NOTE — Telephone Encounter (Signed)
appts made for pt per 11-17-20 los, pt to view on mychart or print from [employer] f/u from remote work)

## 2020-11-17 NOTE — Progress Notes (Signed)
Hematology and Oncology Follow Up Visit  Brian Mays [id number] 02-06-1946 75 y.o. 11/17/2020   Principle Diagnosis:  Metastatic low grade neuroendocrine tumor-hepatic metastases Iron deficiencyanemia  Past Therapy: S/p yttrium-90 intrahepatic therapy - November 2016 Lutathera (Lu 177) injection on 02/01/2018 - s/p cycle 4  Current Therapy: Somatuline 120 mg monthly IV Ironas indicated   Interim History:  Brian Mays is here today for follow-up and injection. He continues to do well and has no complaints at this time.  His golf fame is on hold this week due to snow. He was able to golf over Christmas in shorts. He really enjoys staying active.  Chromogranin A level last month was 123.  He denies any fever, chills, n/v, cough, rash, dizziness, SOB, chest pain, palpitations, abdominal pain or changes in bowel or bladder habits.  No swelling, tenderness, numbness or tingling in his extremities. No falls or syncope.  He has maintained a good appetite and is staying well hydrated. His weight is stable.   ECOG Performance Status: 1 - Symptomatic but completely ambulatory  Medications:  Allergies as of 11/17/2020      Reactions   Iohexol Hives    Code: HIVES, Desc: PER [name] @ PRIMARY CARE, PT IS ALLERGIC TO CONTRAST DYE 10/02/08/RM  05/01/10...needs full premeds per our protocol w/ [facility]., Onset Date: [id number]      Medication List       Accurate as of November 17, 2020 12:47 PM. If you have any questions, ask your nurse or doctor.        aspirin EC 81 MG tablet Take 81 mg by mouth every morning.   atorvastatin 20 MG tablet Commonly known as: LIPITOR TAKE 1 TABLET (20 MG TOTAL) BY MOUTH DAILY AT 6 PM.   Cholecalciferol 25 MCG (1000 UT) tablet Take 1,000 Units by mouth daily.   clopidogrel 75 MG tablet Commonly known as: PLAVIX TAKE 1 TABLET BY MOUTH EVERY DAY   fluticasone 50 MCG/ACT nasal spray Commonly known as: FLONASE Place 2 sprays into both  nostrils daily as needed (sinuses).   hydrochlorothiazide 25 MG tablet Commonly known as: HYDRODIURIL TAKE 1/2 TABLET BY MOUTH EVERY DAY   hydrocortisone 25 MG suppository Commonly known as: ANUSOL-HC INSERT 1 SUPPOSITORY RECTALLY TWICE A DAY AS NEEDED FOR HEMORRHOIDS   ipratropium 0.03 % nasal spray Commonly known as: Atrovent Place 2 sprays into the nose 3 (three) times daily.   lansoprazole 30 MG capsule Commonly known as: PREVACID Take 1 capsule (30 mg total) by mouth daily at 12 noon.   levothyroxine 50 MCG tablet Commonly known as: SYNTHROID Take 1 tablet (50 mcg total) by mouth daily before breakfast.   losartan 100 MG tablet Commonly known as: COZAAR TAKE 1 TABLET BY MOUTH EVERY DAY   metoprolol tartrate 25 MG tablet Commonly known as: LOPRESSOR TAKE 0.5 TABLETS (12.5 MG TOTAL) BY MOUTH 2 (TWO) TIMES DAILY.   nitroGLYCERIN 0.4 MG SL tablet Commonly known as: NITROSTAT PLACE 1 TABLET UNDER THE TONGUE EVERY 5 (FIVE) MINUTES X 3 DOSES AS NEEDED FOR CHEST PAIN.   sodium chloride 0.65 % Soln nasal spray Commonly known as: OCEAN Place 1 spray into both nostrils as needed for congestion.   SOMATULINE DEPOT [place] Inject 120 mcg into the skin every 28 (twenty-eight) days. Receives at Dr Antonieta Pert office   vitamin B-12 1000 MCG tablet Commonly known as: CYANOCOBALAMIN Take 1,000 mcg by mouth every evening.       Allergies:  Allergies  Allergen Reactions  .  Iohexol Hives     Code: HIVES, Desc: PER [name] @ PRIMARY CARE, PT IS ALLERGIC TO CONTRAST DYE 10/02/08/RM  05/01/10...needs full premeds per our protocol w/ [facility]., Onset Date: [id number]     Past Medical History, Surgical history, Social history, and Family History were reviewed and updated.  Review of Systems: All other 10 point review of systems is negative.   Physical Exam:  vitals were not taken for this visit.   Wt Readings from Last 3 Encounters:  10/13/20 208 lb 6.4 oz (94.5 kg)  10/03/20 206 lb  (93.4 kg)  09/15/20 207 lb (93.9 kg)    Ocular: Sclerae unicteric, pupils equal, round and reactive to light Ear-nose-throat: Oropharynx clear, dentition fair Lymphatic: No cervical or supraclavicular adenopathy Lungs no rales or rhonchi, good excursion bilaterally Heart regular rate and rhythm, no murmur appreciated Abd soft, nontender, positive bowel sounds MSK no focal spinal tenderness, no joint edema Neuro: non-focal, well-oriented, appropriate affect Breasts: Deferred   Lab Results  Component Value Date   WBC 6.2 10/13/2020   HGB 12.9 (L) 10/13/2020   HCT 39.7 10/13/2020   MCV 93.4 10/13/2020   PLT 145 (L) 10/13/2020   Lab Results  Component Value Date   FERRITIN 150 10/13/2020   IRON 75 10/13/2020   TIBC 303 10/13/2020   UIBC 227 10/13/2020   IRONPCTSAT 25 10/13/2020   Lab Results  Component Value Date   RBC 4.25 10/13/2020   No results found for: KPAFRELGTCHN, LAMBDASER, KAPLAMBRATIO No results found for: IGGSERUM, IGA, IGMSERUM No results found for: Odetta Pink, SPEI   Chemistry      Component Value Date/Time   NA 139 10/13/2020 1346   NA 138 09/15/2018 0934   NA 144 10/04/2017 0904   NA 141 07/23/2016 1255   K 4.7 10/13/2020 1346   K 3.8 10/04/2017 0904   K 4.1 07/23/2016 1255   CL 101 10/13/2020 1346   CL 103 10/04/2017 0904   CO2 32 10/13/2020 1346   CO2 28 10/04/2017 0904   CO2 27 07/23/2016 1255   BUN 18 10/13/2020 1346   BUN 14 09/15/2018 0934   BUN 14 10/04/2017 0904   BUN 13.8 07/23/2016 1255   CREATININE 1.11 10/13/2020 1346   CREATININE 0.8 10/04/2017 0904   CREATININE 1.0 07/23/2016 1255      Component Value Date/Time   CALCIUM 9.7 10/13/2020 1346   CALCIUM 9.4 10/04/2017 0904   CALCIUM 9.2 07/23/2016 1255   ALKPHOS 54 10/13/2020 1346   ALKPHOS 64 10/04/2017 0904   ALKPHOS 77 07/23/2016 1255   AST 21 10/13/2020 1346   AST 23 07/23/2016 1255   ALT 15 10/13/2020 1346   ALT 22  10/04/2017 0904   ALT 13 07/23/2016 1255   BILITOT 0.5 10/13/2020 1346   BILITOT 1.13 07/23/2016 1255       Impression and Plan: Brian Mays isa very pleasant 75 yo caucasian gentleman withmetastatic low-grade neuroendocrine carcinoma with hepatic metastasis. He received his Somatulin injection today as planned.  Iron studies are pending.  Follow-up in 1 month.  He can contact our office with any questions or concerns.   Laverna Peace, NP 1/17/202212:47 PM

## 2020-11-20 LAB — CHROMOGRANIN A: Chromogranin A (ng/mL): 133.9 ng/mL — ABNORMAL HIGH (ref 0.0–101.8)

## 2020-12-14 ENCOUNTER — Other Ambulatory Visit: Payer: Self-pay | Admitting: Cardiovascular Disease

## 2020-12-15 ENCOUNTER — Inpatient Hospital Stay: Payer: Medicare HMO

## 2020-12-15 ENCOUNTER — Inpatient Hospital Stay (HOSPITAL_BASED_OUTPATIENT_CLINIC_OR_DEPARTMENT_OTHER): Payer: Medicare HMO | Admitting: Hematology & Oncology

## 2020-12-15 ENCOUNTER — Other Ambulatory Visit: Payer: Self-pay

## 2020-12-15 ENCOUNTER — Inpatient Hospital Stay: Payer: Medicare HMO | Attending: Hematology & Oncology

## 2020-12-15 ENCOUNTER — Telehealth: Payer: Self-pay | Admitting: *Deleted

## 2020-12-15 ENCOUNTER — Encounter: Payer: Self-pay | Admitting: Hematology & Oncology

## 2020-12-15 VITALS — BP 152/69 | HR 79 | Temp 98.7°F | Resp 16 | Wt 208.0 lb

## 2020-12-15 DIAGNOSIS — C7B8 Other secondary neuroendocrine tumors: Secondary | ICD-10-CM | POA: Diagnosis not present

## 2020-12-15 DIAGNOSIS — C7B02 Secondary carcinoid tumors of liver: Secondary | ICD-10-CM | POA: Diagnosis not present

## 2020-12-15 DIAGNOSIS — D500 Iron deficiency anemia secondary to blood loss (chronic): Secondary | ICD-10-CM

## 2020-12-15 DIAGNOSIS — C7A8 Other malignant neuroendocrine tumors: Secondary | ICD-10-CM | POA: Insufficient documentation

## 2020-12-15 DIAGNOSIS — C7A098 Malignant carcinoid tumors of other sites: Secondary | ICD-10-CM | POA: Insufficient documentation

## 2020-12-15 DIAGNOSIS — D509 Iron deficiency anemia, unspecified: Secondary | ICD-10-CM | POA: Diagnosis not present

## 2020-12-15 LAB — CMP (CANCER CENTER ONLY)
ALT: 16 U/L (ref 0–44)
AST: 24 U/L (ref 15–41)
Albumin: 4 g/dL (ref 3.5–5.0)
Alkaline Phosphatase: 54 U/L (ref 38–126)
Anion gap: 7 (ref 5–15)
BUN: 14 mg/dL (ref 8–23)
CO2: 31 mmol/L (ref 22–32)
Calcium: 9.7 mg/dL (ref 8.9–10.3)
Chloride: 100 mmol/L (ref 98–111)
Creatinine: 1.12 mg/dL (ref 0.61–1.24)
GFR, Estimated: >60 mL/min (ref >60)
Glucose, Bld: 130 mg/dL — ABNORMAL HIGH (ref 70–99)
Potassium: 4.3 mmol/L (ref 3.5–5.1)
Sodium: 138 mmol/L (ref 135–145)
Total Bilirubin: 0.6 mg/dL (ref 0.3–1.2)
Total Protein: 6.9 g/dL (ref 6.5–8.1)

## 2020-12-15 LAB — CBC WITH DIFFERENTIAL (CANCER CENTER ONLY)
Abs Immature Granulocytes: 0.04 10*3/uL (ref 0.00–0.07)
Basophils Absolute: 0.1 10*3/uL (ref 0.0–0.1)
Basophils Relative: 1 %
Eosinophils Absolute: 0.3 10*3/uL (ref 0.0–0.5)
Eosinophils Relative: 5 %
HCT: 40.5 % (ref 39.0–52.0)
Hemoglobin: 13.2 g/dL (ref 13.0–17.0)
Immature Granulocytes: 1 %
Lymphocytes Relative: 13 %
Lymphs Abs: 0.9 10*3/uL (ref 0.7–4.0)
MCH: 30.1 pg (ref 26.0–34.0)
MCHC: 32.6 g/dL (ref 30.0–36.0)
MCV: 92.5 fL (ref 80.0–100.0)
Monocytes Absolute: 0.4 10*3/uL (ref 0.1–1.0)
Monocytes Relative: 6 %
Neutro Abs: 5.1 10*3/uL (ref 1.7–7.7)
Neutrophils Relative %: 74 %
Platelet Count: 151 10*3/uL (ref 150–400)
RBC: 4.38 MIL/uL (ref 4.22–5.81)
RDW: 13.6 % (ref 11.5–15.5)
WBC Count: 6.8 10*3/uL (ref 4.0–10.5)
nRBC: 0 % (ref 0.0–0.2)

## 2020-12-15 LAB — LACTATE DEHYDROGENASE: LDH: 178 U/L (ref 98–192)

## 2020-12-15 MED ORDER — LANREOTIDE ACETATE 120 MG/0.5ML ~~LOC~~ SOLN
120.0000 mg | Freq: Once | SUBCUTANEOUS | Status: AC
  Administered 2020-12-15: 120 mg via SUBCUTANEOUS

## 2020-12-15 MED ORDER — LANREOTIDE ACETATE 120 MG/0.5ML ~~LOC~~ SOLN
SUBCUTANEOUS | Status: AC
  Filled 2020-12-15: qty 120

## 2020-12-15 NOTE — Progress Notes (Signed)
Hematology and Oncology Follow Up Visit  Brian Mays [id number] 1946-02-22 75 y.o. 12/15/2020   Principle Diagnosis:  Metastatic low grade neuroendocrine tumor-hepatic metastases Iron deficiency anemia  Past Therapy: S/p yttrium-90 intrahepatic therapy - November 2016 Lutathera (Lu 177) injection on 02/01/2018 - s/p cycle 4  Current Therapy: Somatuline 120 mg monthly IV Iron as indicated    Interim History:  Brian Mays is here today for follow-up.  He is doing pretty well.  He really has had no specific complaints since we last saw him.  He has not had any issues with diarrhea.  His last chromogranin A level was 134.  This is holding steady.  He has had no problems with cough or shortness of breath.  He has had no fever.  There has been no rashes.    There is been no leg swelling.  His last set of scans were done back in November.  Maybe, we will be able to use the new overall nuclear medicine scan uses radioactive copper.  His heart has been doing pretty well.  He has had no palpitations.  His last iron studies back in January showed a ferritin of 88 with an iron saturation of 18%.  Currently, his performance status is ECOG 1.     Medications:  Allergies as of 12/15/2020      Reactions   Iohexol Hives    Code: HIVES, Desc: PER [name] @ PRIMARY CARE, PT IS ALLERGIC TO CONTRAST DYE 10/02/08/RM  05/01/10...needs full premeds per our protocol w/ [facility]., Onset Date: [id number]      Medication List       Accurate as of December 15, 2020  3:43 PM. If you have any questions, ask your nurse or doctor.        aspirin EC 81 MG tablet Take 81 mg by mouth every morning.   atorvastatin 20 MG tablet Commonly known as: LIPITOR TAKE 1 TABLET (20 MG TOTAL) BY MOUTH DAILY AT 6 PM.   Cholecalciferol 25 MCG (1000 UT) tablet Take 1,000 Units by mouth daily.   clopidogrel 75 MG tablet Commonly known as: PLAVIX TAKE 1 TABLET BY MOUTH EVERY DAY   fluticasone 50 MCG/ACT  nasal spray Commonly known as: FLONASE Place 2 sprays into both nostrils daily as needed (sinuses).   hydrochlorothiazide 25 MG tablet Commonly known as: HYDRODIURIL TAKE 1/2 TABLET BY MOUTH EVERY DAY   hydrocortisone 25 MG suppository Commonly known as: ANUSOL-HC INSERT 1 SUPPOSITORY RECTALLY TWICE A DAY AS NEEDED FOR HEMORRHOIDS   ipratropium 0.03 % nasal spray Commonly known as: Atrovent Place 2 sprays into the nose 3 (three) times daily.   lansoprazole 30 MG capsule Commonly known as: PREVACID Take 1 capsule (30 mg total) by mouth daily at 12 noon.   levothyroxine 50 MCG tablet Commonly known as: SYNTHROID Take 1 tablet (50 mcg total) by mouth daily before breakfast.   losartan 100 MG tablet Commonly known as: COZAAR TAKE 1 TABLET BY MOUTH EVERY DAY   metoprolol tartrate 25 MG tablet Commonly known as: LOPRESSOR TAKE 0.5 TABLETS (12.5 MG TOTAL) BY MOUTH 2 (TWO) TIMES DAILY.   nitroGLYCERIN 0.4 MG SL tablet Commonly known as: NITROSTAT PLACE 1 TABLET UNDER THE TONGUE EVERY 5 (FIVE) MINUTES X 3 DOSES AS NEEDED FOR CHEST PAIN.   sodium chloride 0.65 % Soln nasal spray Commonly known as: OCEAN Place 1 spray into both nostrils as needed for congestion.   SOMATULINE DEPOT [place] Inject 120 mcg into the skin every 28 (  twenty-eight) days. Receives at Dr Antonieta Pert office   vitamin B-12 1000 MCG tablet Commonly known as: CYANOCOBALAMIN Take 1,000 mcg by mouth every evening.       Allergies:  Allergies  Allergen Reactions  . Iohexol Hives     Code: HIVES, Desc: PER [name] @ PRIMARY CARE, PT IS ALLERGIC TO CONTRAST DYE 10/02/08/RM  05/01/10...needs full premeds per our protocol w/ [facility]., Onset Date: [id number]     Past Medical History, Surgical history, Social history, and Family History were reviewed and updated.  Review of Systems: Review of Systems  Constitutional: Negative.   HENT: Negative.   Eyes: Negative.   Respiratory: Negative.   Cardiovascular:  Negative.   Gastrointestinal: Negative.   Genitourinary: Negative.   Musculoskeletal: Negative.   Skin: Negative.   Neurological: Negative.   Endo/Heme/Allergies: Negative.   Psychiatric/Behavioral: Negative.      Physical Exam:  weight is 208 lb (94.3 kg). His oral temperature is 98.7 F (37.1 C). His blood pressure is 152/69 (abnormal) and his pulse is 79. His respiration is 16 and oxygen saturation is 100%.   Wt Readings from Last 3 Encounters:  12/15/20 208 lb (94.3 kg)  11/17/20 210 lb (95.3 kg)  10/13/20 208 lb 6.4 oz (94.5 kg)    Physical Exam Vitals reviewed.  HENT:     Head: Normocephalic and atraumatic.  Eyes:     Pupils: Pupils are equal, round, and reactive to light.  Cardiovascular:     Rate and Rhythm: Normal rate and regular rhythm.     Heart sounds: Normal heart sounds.  Pulmonary:     Effort: Pulmonary effort is normal.     Breath sounds: Normal breath sounds.  Abdominal:     General: Bowel sounds are normal.     Palpations: Abdomen is soft.  Musculoskeletal:        General: No tenderness or deformity. Normal range of motion.     Cervical back: Normal range of motion.  Lymphadenopathy:     Cervical: No cervical adenopathy.  Skin:    General: Skin is warm and dry.     Findings: No erythema or rash.  Neurological:     Mental Status: He is alert and oriented to person, place, and time.  Psychiatric:        Behavior: Behavior normal.        Thought Content: Thought content normal.        Judgment: Judgment normal.    Lab Results  Component Value Date   WBC 6.8 12/15/2020   HGB 13.2 12/15/2020   HCT 40.5 12/15/2020   MCV 92.5 12/15/2020   PLT 151 12/15/2020   Lab Results  Component Value Date   FERRITIN 88 11/17/2020   IRON 60 11/17/2020   TIBC 338 11/17/2020   UIBC 278 11/17/2020   IRONPCTSAT 18 11/17/2020   Lab Results  Component Value Date   RBC 4.38 12/15/2020   No results found for: KPAFRELGTCHN, LAMBDASER, KAPLAMBRATIO No  results found for: IGGSERUM, IGA, IGMSERUM No results found for: Odetta Pink, SPEI   Chemistry      Component Value Date/Time   NA 138 12/15/2020 1352   NA 138 09/15/2018 0934   NA 144 10/04/2017 0904   NA 141 07/23/2016 1255   K 4.3 12/15/2020 1352   K 3.8 10/04/2017 0904   K 4.1 07/23/2016 1255   CL 100 12/15/2020 1352   CL 103 10/04/2017 0904   CO2 31 12/15/2020  1352   CO2 28 10/04/2017 0904   CO2 27 07/23/2016 1255   BUN 14 12/15/2020 1352   BUN 14 09/15/2018 0934   BUN 14 10/04/2017 0904   BUN 13.8 07/23/2016 1255   CREATININE 1.12 12/15/2020 1352   CREATININE 0.8 10/04/2017 0904   CREATININE 1.0 07/23/2016 1255      Component Value Date/Time   CALCIUM 9.7 12/15/2020 1352   CALCIUM 9.4 10/04/2017 0904   CALCIUM 9.2 07/23/2016 1255   ALKPHOS 54 12/15/2020 1352   ALKPHOS 64 10/04/2017 0904   ALKPHOS 77 07/23/2016 1255   AST 24 12/15/2020 1352   AST 23 07/23/2016 1255   ALT 16 12/15/2020 1352   ALT 22 10/04/2017 0904   ALT 13 07/23/2016 1255   BILITOT 0.6 12/15/2020 1352   BILITOT 1.13 07/23/2016 1255       Impression and Plan: Mr. Medford is a very pleasant 75 yo caucasian gentleman with metastatic low-grade neuroendocrine carcinoma.  He has hepatic metastasis.  I am hopefully trying to get the new nuclear medicine test.  I think this would certainly help [place].    He will get his Somatuline today.    We will go ahead and plan to get him back in another 4 weeks.   Volanda Napoleon, MD 2/14/20223:43 PM

## 2020-12-15 NOTE — Telephone Encounter (Signed)
Per los 2/14 lvm of upcoming aptts.

## 2020-12-15 NOTE — Patient Instructions (Signed)
Lanreotide injection What is this medicine? LANREOTIDE (lan REE oh tide) is used to reduce blood levels of growth hormone in patients with a condition called acromegaly. It also works to slow or stop tumor growth in patients with neuroendocrine tumors and treat carcinoid syndrome. This medicine may be used for other purposes; ask your health care provider or pharmacist if you have questions. COMMON BRAND NAME(S): Somatuline [place] What should I tell my health care provider before I take this medicine? They need to know if you have any of these conditions:  diabetes  gallbladder disease  heart disease  kidney disease  liver disease  thyroid disease  an unusual or allergic reaction to lanreotide, other medicines, foods, dyes, or preservatives  pregnant or trying to get pregnant  breast-feeding How should I use this medicine? This medicine is for injection under the skin. It is given by a health care professional in a hospital or clinic setting. Contact your pediatrician or health care professional regarding the use of this medicine in children. Special care may be needed. Overdosage: If you think you have taken too much of this medicine contact a poison control center or emergency room at once. NOTE: This medicine is only for you. Do not share this medicine with others. What if I miss a dose? It is important not to miss your dose. Call your doctor or health care professional if you are unable to keep an appointment. What may interact with this medicine? This medicine may interact with the following medications:  bromocriptine  cyclosporine  certain medicines for blood pressure, heart disease, irregular heart beat  certain medicines for diabetes  quinidine  terfenadine This list may not describe all possible interactions. Give your health care provider a list of all the medicines, herbs, non-prescription drugs, or dietary supplements you use. Also tell them if you smoke,  drink alcohol, or use illegal drugs. Some items may interact with your medicine. What should I watch for while using this medicine? Tell your doctor or healthcare professional if your symptoms do not start to get better or if they get worse. Visit your doctor or health care professional for regular checks on your progress. Your condition will be monitored carefully while you are receiving this medicine. This medicine may increase blood sugar. Ask your healthcare provider if changes in diet or medicines are needed if you have diabetes. You may need blood work done while you are taking this medicine. Women should inform their doctor if they wish to become pregnant or think they might be pregnant. There is a potential for serious side effects to an unborn child. Talk to your health care professional or pharmacist for more information. Do not breast-feed an infant while taking this medicine or for 6 months after stopping it. This medicine has caused ovarian failure in some women. This medicine may interfere with the ability to have a child. Talk with your doctor or health care professional if you are concerned about your fertility. What side effects may I notice from receiving this medicine? Side effects that you should report to your doctor or health care professional as soon as possible:  allergic reactions like skin rash, itching or hives, swelling of the face, lips, or tongue  increased blood pressure  severe stomach pain  signs and symptoms of hgh blood sugar such as being more thirsty or hungry or having to urinate more than normal. You may also feel very tired or have blurry vision.  signs and symptoms of low blood   sugar such as feeling anxious; confusion; dizziness; increased hunger; unusually weak or tired; sweating; shakiness; cold; irritable; headache; blurred vision; fast heartbeat; loss of consciousness  unusually slow heartbeat Side effects that usually do not require medical  attention (report to your doctor or health care professional if they continue or are bothersome):  constipation  diarrhea  dizziness  headache  muscle pain  muscle spasms  nausea  pain, redness, or irritation at site where injected This list may not describe all possible side effects. Call your doctor for medical advice about side effects. You may report side effects to FDA at 1-800-FDA-1088. Where should I keep my medicine? This drug is given in a hospital or clinic and will not be stored at home. NOTE: This sheet is a summary. It may not cover all possible information. If you have questions about this medicine, talk to your doctor, pharmacist, or health care provider.  2021 Elsevier/Gold Standard (2018-07-27 09:13:08)  

## 2020-12-16 LAB — IRON AND TIBC
Iron: 57 ug/dL (ref 42–163)
Saturation Ratios: 19 % — ABNORMAL LOW (ref 20–55)
TIBC: 305 ug/dL (ref 202–409)
UIBC: 248 ug/dL (ref 117–376)

## 2020-12-16 LAB — FERRITIN: Ferritin: 113 ng/mL (ref 24–336)

## 2020-12-24 ENCOUNTER — Telehealth: Payer: Self-pay

## 2020-12-24 NOTE — Telephone Encounter (Signed)
Called and left a vm with PET scan appt, per 2.23.22 secure chat/tara this is approved     Perry Brucato

## 2021-01-09 ENCOUNTER — Other Ambulatory Visit: Payer: Self-pay

## 2021-01-09 ENCOUNTER — Ambulatory Visit (HOSPITAL_COMMUNITY)
Admission: RE | Admit: 2021-01-09 | Discharge: 2021-01-09 | Disposition: A | Discharge status: Home / Self Care | Payer: Medicare HMO | Source: Ambulatory Visit | Attending: Hematology & Oncology | Admitting: Hematology & Oncology

## 2021-01-09 DIAGNOSIS — C7A1 Malignant poorly differentiated neuroendocrine tumors: Secondary | ICD-10-CM | POA: Diagnosis not present

## 2021-01-09 DIAGNOSIS — D3A8 Other benign neuroendocrine tumors: Secondary | ICD-10-CM | POA: Diagnosis not present

## 2021-01-09 DIAGNOSIS — C7B8 Other secondary neuroendocrine tumors: Secondary | ICD-10-CM | POA: Diagnosis not present

## 2021-01-09 DIAGNOSIS — K7689 Other specified diseases of liver: Secondary | ICD-10-CM | POA: Diagnosis not present

## 2021-01-09 IMAGING — PT NM PET SKULL BASE TO THIGH
1 of 9 series · 1 of 25 positions shown · non-contrast
Comparison: DOTATATE PET-CT scan [DATE], [DATE], [DATE]

CLINICAL DATA: Subsequent treatment strategy for well
differentiated neuroendocrine tumor. Status post post peptide
receptor radiotherapy (Lutathera) completed [DATE]. Yttrium 90
liver directed radio embolization [0Q].

EXAM:
NUCLEAR MEDICINE PET SKULL BASE TO THIGH
TECHNIQUE: 3.4 mCi TIGER 64 DOTATATE was injected intravenously. Full-ring PET
imaging was performed from the skull base to thigh after the
radiotracer. CT data was obtained and used for attenuation
correction and anatomic localization.

[Series 4: ct sk_thigh 5.0 bf37 · axial · 5.0mm · 0.98mm/px · 1 of 239 slices shown]
[im 239/239  brain]
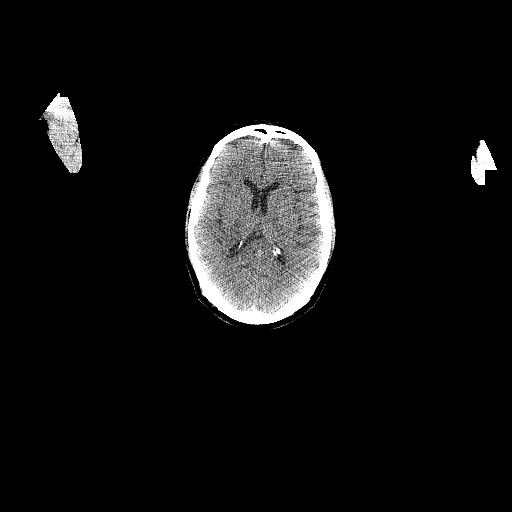

[1 of 25 positions shown; findings below may reference images not displayed]

FINDINGS: NECK

No radiotracer activity in neck lymph nodes.

Incidental CT findings: None

CHEST

No radiotracer accumulation within mediastinal or hilar lymph nodes.
No suspicious pulmonary nodules on the CT scan.

Incidental CT finding:None

ABDOMEN/PELVIS

Again demonstrated intense radiotracer uptake within multiple liver
metastasis. Size, radiotracer intensity, and pattern are not changed
significantly. No new lesions.

Example lesion in the lateral LEFT hepatic lobe measures 4.2 cm
compared to 4.2 cm with SUV max equal 60.0 compared SUV max equal
51.0

More centrally, 1.8 cm lesion compares to 1.8 cm (image 97) with SUV
max equal 45.5 compared to 45.8.

Large lesion partially exophytic from the RIGHT hepatic lobe
measures 9.5 cm in maximum dimension compared to 9.4 cm. There is a
peripheral intense radiotracer activity with SUV max equal
compared SUV max equal 43.7.

Centrally within the mesentery partially calcified mass measures
cm compared to 2.2 cm on comparison PET scan with SUV max equal
compared SUV max equal 47.1.

Node new lesions within the abdomen pelvis.

Physiologic activity noted in the liver, spleen, adrenal glands and
kidneys.

Incidental CT findings:None

SKELETON

Single focus of skeletal activity in the T12 vertebral body with SUV
max equal 6.1 compared SUV max equal 8.1 (image 113).

Incidental CT findings:None
IMPRESSION: 1. Overall stable metastatic well differentiated neuroendocrine
tumor. No evidence disease progression
2. Multiple hepatic lesions are not changed in size or radiotracer
activity compared to DOTATATE PET scan [DATE].
[DATE]. Stable partially calcified small central mesenteric mass with
stable radiotracer activity.
4. Stable very small skeletal metastasis in the T12 vertebral body.

## 2021-01-09 MED ORDER — COPPER CU 64 DOTATATE 1 MCI/ML IV SOLN
4.0000 | Freq: Once | INTRAVENOUS | Status: AC
  Administered 2021-01-09: 3.4 via INTRAVENOUS

## 2021-01-12 ENCOUNTER — Inpatient Hospital Stay (HOSPITAL_BASED_OUTPATIENT_CLINIC_OR_DEPARTMENT_OTHER): Payer: Medicare HMO | Admitting: Family

## 2021-01-12 ENCOUNTER — Encounter: Payer: Self-pay | Admitting: *Deleted

## 2021-01-12 ENCOUNTER — Telehealth: Payer: Self-pay | Admitting: Family

## 2021-01-12 ENCOUNTER — Inpatient Hospital Stay: Payer: Medicare HMO | Attending: Hematology & Oncology

## 2021-01-12 ENCOUNTER — Other Ambulatory Visit: Payer: Self-pay

## 2021-01-12 ENCOUNTER — Encounter: Payer: Self-pay | Admitting: Family

## 2021-01-12 ENCOUNTER — Inpatient Hospital Stay: Payer: Medicare HMO

## 2021-01-12 VITALS — BP 162/77 | HR 71 | Temp 98.3°F | Resp 18 | Ht 70.5 in | Wt 209.1 lb

## 2021-01-12 DIAGNOSIS — C7B8 Other secondary neuroendocrine tumors: Secondary | ICD-10-CM | POA: Insufficient documentation

## 2021-01-12 DIAGNOSIS — D500 Iron deficiency anemia secondary to blood loss (chronic): Secondary | ICD-10-CM

## 2021-01-12 DIAGNOSIS — C7A8 Other malignant neuroendocrine tumors: Secondary | ICD-10-CM | POA: Diagnosis not present

## 2021-01-12 LAB — CMP (CANCER CENTER ONLY)
ALT: 14 U/L (ref 0–44)
AST: 21 U/L (ref 15–41)
Albumin: 4.1 g/dL (ref 3.5–5.0)
Alkaline Phosphatase: 49 U/L (ref 38–126)
Anion gap: 6 (ref 5–15)
BUN: 14 mg/dL (ref 8–23)
CO2: 30 mmol/L (ref 22–32)
Calcium: 9.6 mg/dL (ref 8.9–10.3)
Chloride: 101 mmol/L (ref 98–111)
Creatinine: 1 mg/dL (ref 0.61–1.24)
GFR, Estimated: >60 mL/min (ref >60)
Glucose, Bld: 124 mg/dL — ABNORMAL HIGH (ref 70–99)
Potassium: 4.4 mmol/L (ref 3.5–5.1)
Sodium: 137 mmol/L (ref 135–145)
Total Bilirubin: 0.5 mg/dL (ref 0.3–1.2)
Total Protein: 7.1 g/dL (ref 6.5–8.1)

## 2021-01-12 LAB — CBC WITH DIFFERENTIAL (CANCER CENTER ONLY)
Abs Immature Granulocytes: 0.02 10*3/uL (ref 0.00–0.07)
Basophils Absolute: 0.1 10*3/uL (ref 0.0–0.1)
Basophils Relative: 1 %
Eosinophils Absolute: 0.4 10*3/uL (ref 0.0–0.5)
Eosinophils Relative: 5 %
HCT: 39.8 % (ref 39.0–52.0)
Hemoglobin: 13.1 g/dL (ref 13.0–17.0)
Immature Granulocytes: 0 %
Lymphocytes Relative: 14 %
Lymphs Abs: 1 10*3/uL (ref 0.7–4.0)
MCH: 30 pg (ref 26.0–34.0)
MCHC: 32.9 g/dL (ref 30.0–36.0)
MCV: 91.3 fL (ref 80.0–100.0)
Monocytes Absolute: 0.4 10*3/uL (ref 0.1–1.0)
Monocytes Relative: 6 %
Neutro Abs: 5.2 10*3/uL (ref 1.7–7.7)
Neutrophils Relative %: 74 %
Platelet Count: 155 10*3/uL (ref 150–400)
RBC: 4.36 MIL/uL (ref 4.22–5.81)
RDW: 14 % (ref 11.5–15.5)
WBC Count: 7.1 10*3/uL (ref 4.0–10.5)
nRBC: 0 % (ref 0.0–0.2)

## 2021-01-12 MED ORDER — LANREOTIDE ACETATE 120 MG/0.5ML ~~LOC~~ SOLN
SUBCUTANEOUS | Status: AC
  Filled 2021-01-12: qty 120

## 2021-01-12 MED ORDER — LANREOTIDE ACETATE 120 MG/0.5ML ~~LOC~~ SOLN
120.0000 mg | Freq: Once | SUBCUTANEOUS | Status: AC
  Administered 2021-01-12: 120 mg via SUBCUTANEOUS

## 2021-01-12 NOTE — Telephone Encounter (Signed)
Appointments scheduled patient did not get scheduled due to his My Chart Access per 3/14 los

## 2021-01-12 NOTE — Patient Instructions (Signed)
Lanreotide injection What is this medicine? LANREOTIDE (lan REE oh tide) is used to reduce blood levels of growth hormone in patients with a condition called acromegaly. It also works to slow or stop tumor growth in patients with neuroendocrine tumors and treat carcinoid syndrome. This medicine may be used for other purposes; ask your health care provider or pharmacist if you have questions. COMMON BRAND NAME(S): Somatuline [place] What should I tell my health care provider before I take this medicine? They need to know if you have any of these conditions:  diabetes  gallbladder disease  heart disease  kidney disease  liver disease  thyroid disease  an unusual or allergic reaction to lanreotide, other medicines, foods, dyes, or preservatives  pregnant or trying to get pregnant  breast-feeding How should I use this medicine? This medicine is for injection under the skin. It is given by a health care professional in a hospital or clinic setting. Contact your pediatrician or health care professional regarding the use of this medicine in children. Special care may be needed. Overdosage: If you think you have taken too much of this medicine contact a poison control center or emergency room at once. NOTE: This medicine is only for you. Do not share this medicine with others. What if I miss a dose? It is important not to miss your dose. Call your doctor or health care professional if you are unable to keep an appointment. What may interact with this medicine? This medicine may interact with the following medications:  bromocriptine  cyclosporine  certain medicines for blood pressure, heart disease, irregular heart beat  certain medicines for diabetes  quinidine  terfenadine This list may not describe all possible interactions. Give your health care provider a list of all the medicines, herbs, non-prescription drugs, or dietary supplements you use. Also tell them if you smoke,  drink alcohol, or use illegal drugs. Some items may interact with your medicine. What should I watch for while using this medicine? Tell your doctor or healthcare professional if your symptoms do not start to get better or if they get worse. Visit your doctor or health care professional for regular checks on your progress. Your condition will be monitored carefully while you are receiving this medicine. This medicine may increase blood sugar. Ask your healthcare provider if changes in diet or medicines are needed if you have diabetes. You may need blood work done while you are taking this medicine. Women should inform their doctor if they wish to become pregnant or think they might be pregnant. There is a potential for serious side effects to an unborn child. Talk to your health care professional or pharmacist for more information. Do not breast-feed an infant while taking this medicine or for 6 months after stopping it. This medicine has caused ovarian failure in some women. This medicine may interfere with the ability to have a child. Talk with your doctor or health care professional if you are concerned about your fertility. What side effects may I notice from receiving this medicine? Side effects that you should report to your doctor or health care professional as soon as possible:  allergic reactions like skin rash, itching or hives, swelling of the face, lips, or tongue  increased blood pressure  severe stomach pain  signs and symptoms of hgh blood sugar such as being more thirsty or hungry or having to urinate more than normal. You may also feel very tired or have blurry vision.  signs and symptoms of low blood   sugar such as feeling anxious; confusion; dizziness; increased hunger; unusually weak or tired; sweating; shakiness; cold; irritable; headache; blurred vision; fast heartbeat; loss of consciousness  unusually slow heartbeat Side effects that usually do not require medical  attention (report to your doctor or health care professional if they continue or are bothersome):  constipation  diarrhea  dizziness  headache  muscle pain  muscle spasms  nausea  pain, redness, or irritation at site where injected This list may not describe all possible side effects. Call your doctor for medical advice about side effects. You may report side effects to FDA at 1-800-FDA-1088. Where should I keep my medicine? This drug is given in a hospital or clinic and will not be stored at home. NOTE: This sheet is a summary. It may not cover all possible information. If you have questions about this medicine, talk to your doctor, pharmacist, or health care provider.  2021 Elsevier/Gold Standard (2018-07-27 09:13:08)  

## 2021-01-12 NOTE — Progress Notes (Signed)
Hematology and Oncology Follow Up Visit  Brian Mays [id number] Jun 07, 1946 75 y.o. 01/12/2021   Principle Diagnosis:  Metastatic low grade neuroendocrine tumor-hepatic metastases Iron deficiencyanemia  Past Therapy: S/p yttrium-90 intrahepatic therapy - November 2016 Lutathera (Lu 177) injection on 02/01/2018 - s/p cycle 4  Current Therapy: Somatuline 120 mg monthly IV Ironas indicated   Interim History:  [name] is here today for follow-up and treatment injection. He is doing well but has noted some fatigue. He feels that he over did it with home projects and golfing several days a week and states that he took 3 days to rest.  He has mild SOB occasionally with over exertion.  Chromogranin A was 133 last month.  His PET scan earlier this month showed stable disease.  No fever, chills, n/v, cough, rash, dizziness, chest pain, palpitations, abdominal pain or changes in bowel or bladder habits.  No swelling, tenderness, numbness or tingling in his extremities.  No falls of syncope to report.  He has maintained a good appetite and is staying well hydrated. His weight is stable 290 lbs.   ECOG Performance Status: 1 - Symptomatic but completely ambulatory  Medications:  Allergies as of 01/12/2021      Reactions   Iohexol Hives    Code: HIVES, Desc: PER [name] @ [facility], PT IS ALLERGIC TO CONTRAST DYE 10/02/08/RM  05/01/10...needs full premeds per our protocol w/ [facility]., Onset Date: [id number]      Medication List       Accurate as of January 12, 2021  1:44 PM. If you have any questions, ask your nurse or doctor.        aspirin EC 81 MG tablet Take 81 mg by mouth every morning.   atorvastatin 20 MG tablet Commonly known as: LIPITOR TAKE 1 TABLET (20 MG TOTAL) BY MOUTH DAILY AT 6 PM.   Cholecalciferol 25 MCG (1000 UT) tablet Take 1,000 Units by mouth daily.   clopidogrel 75 MG tablet Commonly known as: PLAVIX TAKE 1 TABLET BY MOUTH EVERY DAY    fluticasone 50 MCG/ACT nasal spray Commonly known as: FLONASE Place 2 sprays into both nostrils daily as needed (sinuses).   hydrochlorothiazide 25 MG tablet Commonly known as: HYDRODIURIL TAKE 1/2 TABLET BY MOUTH EVERY DAY   hydrocortisone 25 MG suppository Commonly known as: ANUSOL-HC INSERT 1 SUPPOSITORY RECTALLY TWICE A DAY AS NEEDED FOR HEMORRHOIDS   ipratropium 0.03 % nasal spray Commonly known as: Atrovent Place 2 sprays into the nose 3 (three) times daily.   lansoprazole 30 MG capsule Commonly known as: PREVACID Take 1 capsule (30 mg total) by mouth daily at 12 noon.   levothyroxine 50 MCG tablet Commonly known as: SYNTHROID Take 1 tablet (50 mcg total) by mouth daily before breakfast.   losartan 100 MG tablet Commonly known as: COZAAR TAKE 1 TABLET BY MOUTH EVERY DAY   metoprolol tartrate 25 MG tablet Commonly known as: LOPRESSOR TAKE 0.5 TABLETS (12.5 MG TOTAL) BY MOUTH 2 (TWO) TIMES DAILY.   nitroGLYCERIN 0.4 MG SL tablet Commonly known as: NITROSTAT PLACE 1 TABLET UNDER THE TONGUE EVERY 5 (FIVE) MINUTES X 3 DOSES AS NEEDED FOR CHEST PAIN.   sodium chloride 0.65 % Soln nasal spray Commonly known as: OCEAN Place 1 spray into both nostrils as needed for congestion.   SOMATULINE DEPOT [place] Inject 120 mcg into the skin every 28 (twenty-eight) days. Receives at Dr Antonieta Pert office   vitamin B-12 1000 MCG tablet Commonly known as: CYANOCOBALAMIN Take 1,000  mcg by mouth every evening.       Allergies:  Allergies  Allergen Reactions  . Iohexol Hives     Code: HIVES, Desc: PER [name] @ [facility], PT IS ALLERGIC TO CONTRAST DYE 10/02/08/RM  05/01/10...needs full premeds per our protocol w/ [facility]., Onset Date: [id number]     Past Medical History, Surgical history, Social history, and Family History were reviewed and updated.  Review of Systems: All other 10 point review of systems is negative.   Physical Exam:  height is 5' 10.5" (1.791 m) and weight  is 209 lb 1.3 oz (94.8 kg). His oral temperature is 98.3 F (36.8 C). His blood pressure is 162/77 (abnormal) and his pulse is 71. His respiration is 18 and oxygen saturation is 100%.   Wt Readings from Last 3 Encounters:  01/12/21 209 lb 1.3 oz (94.8 kg)  12/15/20 208 lb (94.3 kg)  11/17/20 210 lb (95.3 kg)    Ocular: Sclerae unicteric, pupils equal, round and reactive to light Ear-nose-throat: Oropharynx clear, dentition fair Lymphatic: No cervical or supraclavicular adenopathy Lungs no rales or rhonchi, good excursion bilaterally Heart regular rate and rhythm, no murmur appreciated Abd soft, nontender, positive bowel sounds MSK no focal spinal tenderness, no joint edema Neuro: non-focal, well-oriented, appropriate affect Breasts: Deferred   Lab Results  Component Value Date   WBC 7.1 01/12/2021   HGB 13.1 01/12/2021   HCT 39.8 01/12/2021   MCV 91.3 01/12/2021   PLT 155 01/12/2021   Lab Results  Component Value Date   FERRITIN 113 12/15/2020   IRON 57 12/15/2020   TIBC 305 12/15/2020   UIBC 248 12/15/2020   IRONPCTSAT 19 (L) 12/15/2020   Lab Results  Component Value Date   RBC 4.36 01/12/2021   No results found for: KPAFRELGTCHN, LAMBDASER, KAPLAMBRATIO No results found for: IGGSERUM, IGA, IGMSERUM No results found for: Odetta Pink, SPEI   Chemistry      Component Value Date/Time   NA 138 12/15/2020 1352   NA 138 09/15/2018 0934   NA 144 10/04/2017 0904   NA 141 07/23/2016 1255   K 4.3 12/15/2020 1352   K 3.8 10/04/2017 0904   K 4.1 07/23/2016 1255   CL 100 12/15/2020 1352   CL 103 10/04/2017 0904   CO2 31 12/15/2020 1352   CO2 28 10/04/2017 0904   CO2 27 07/23/2016 1255   BUN 14 12/15/2020 1352   BUN 14 09/15/2018 0934   BUN 14 10/04/2017 0904   BUN 13.8 07/23/2016 1255   CREATININE 1.12 12/15/2020 1352   CREATININE 0.8 10/04/2017 0904   CREATININE 1.0 07/23/2016 1255      Component Value  Date/Time   CALCIUM 9.7 12/15/2020 1352   CALCIUM 9.4 10/04/2017 0904   CALCIUM 9.2 07/23/2016 1255   ALKPHOS 54 12/15/2020 1352   ALKPHOS 64 10/04/2017 0904   ALKPHOS 77 07/23/2016 1255   AST 24 12/15/2020 1352   AST 23 07/23/2016 1255   ALT 16 12/15/2020 1352   ALT 22 10/04/2017 0904   ALT 13 07/23/2016 1255   BILITOT 0.6 12/15/2020 1352   BILITOT 1.13 07/23/2016 1255       Impression and Plan:  Mr. Sooy isa very pleasant 75 yo caucasian gentleman withmetastatic low-grade neuroendocrine carcinoma withhepatic metastasis. His PET scan this month showed stable disease.  We will proceed with Somatuline injection today as planned.  Iron studies are pending. We will replace if needed.  Follow-up in another  month.  She can contact our office with any questions or concerns.   Laverna Peace, NP 3/14/20221:44 PM

## 2021-01-13 ENCOUNTER — Telehealth: Payer: Self-pay | Admitting: *Deleted

## 2021-01-13 LAB — IRON AND TIBC
Iron: 54 ug/dL (ref 42–163)
Saturation Ratios: 16 % — ABNORMAL LOW (ref 20–55)
TIBC: 338 ug/dL (ref 202–409)
UIBC: 284 ug/dL (ref 117–376)

## 2021-01-13 LAB — FERRITIN: Ferritin: 97 ng/mL (ref 24–336)

## 2021-01-13 NOTE — Telephone Encounter (Signed)
Call - the iron is low!! He needs a dose of IV Iron!! Pete  Unable to reach pt,lmovm for pt with results and expect a call from scheduling for IV Iron appt.

## 2021-01-16 LAB — CHROMOGRANIN A: Chromogranin A (ng/mL): 135.3 ng/mL — ABNORMAL HIGH (ref 0.0–101.8)

## 2021-01-21 ENCOUNTER — Telehealth: Payer: Self-pay | Admitting: *Deleted

## 2021-01-21 ENCOUNTER — Encounter: Payer: Self-pay | Admitting: Hematology & Oncology

## 2021-01-21 NOTE — Telephone Encounter (Signed)
Per scheduling message - Brian Mays 01/21/21 lvm of upcoming appointment

## 2021-01-28 ENCOUNTER — Inpatient Hospital Stay: Payer: Medicare HMO

## 2021-01-28 ENCOUNTER — Other Ambulatory Visit: Payer: Self-pay

## 2021-01-28 VITALS — BP 136/61 | HR 55 | Temp 98.6°F | Resp 17

## 2021-01-28 DIAGNOSIS — D500 Iron deficiency anemia secondary to blood loss (chronic): Secondary | ICD-10-CM

## 2021-01-28 DIAGNOSIS — C7B8 Other secondary neuroendocrine tumors: Secondary | ICD-10-CM | POA: Diagnosis not present

## 2021-01-28 DIAGNOSIS — C7A8 Other malignant neuroendocrine tumors: Secondary | ICD-10-CM | POA: Diagnosis not present

## 2021-01-28 MED ORDER — SODIUM CHLORIDE 0.9 % IV SOLN
750.0000 mg | Freq: Once | INTRAVENOUS | Status: AC
  Administered 2021-01-28: 750 mg via INTRAVENOUS
  Filled 2021-01-28: qty 15

## 2021-01-28 NOTE — Patient Instructions (Signed)
Ferric carboxymaltose injection What is this medicine? FERRIC CARBOXYMALTOSE (ferr-ik car-box-ee-mol-toes) is an iron complex. Iron is used to make healthy red blood cells, which carry oxygen and nutrients throughout the body. This medicine is used to treat anemia in people with chronic kidney disease or people who cannot take iron by mouth. This medicine may be used for other purposes; ask your health care provider or pharmacist if you have questions. COMMON BRAND NAME(S): Injectafer What should I tell my health care provider before I take this medicine? They need to know if you have any of these conditions:  high levels of iron in the blood  liver disease  an unusual or allergic reaction to iron, other medicines, foods, dyes, or preservatives  pregnant or trying to get pregnant  breast-feeding How should I use this medicine? This medicine is for infusion into a vein. It is given by a health care professional in a hospital or clinic setting. Talk to your pediatrician regarding the use of this medicine in children. Special care may be needed. Overdosage: If you think you have taken too much of this medicine contact a poison control center or emergency room at once. NOTE: This medicine is only for you. Do not share this medicine with others. What if I miss a dose? Keep appointments for follow-up doses. It is important not to miss your dose. Call your care team if you are unable to keep an appointment. What may interact with this medicine? Do not take this medicine with any of the following medications:  deferoxamine  dimercaprol  other iron products This list may not describe all possible interactions. Give your health care provider a list of all the medicines, herbs, non-prescription drugs, or dietary supplements you use. Also tell them if you smoke, drink alcohol, or use illegal drugs. Some items may interact with your medicine. What should I watch for while using this  medicine? Visit your doctor or health care professional regularly. Tell your doctor if your symptoms do not start to get better or if they get worse. You may need blood work done while you are taking this medicine. You may need to follow a special diet. Talk to your doctor. Foods that contain iron include: whole grains/cereals, dried fruits, beans, or peas, leafy green vegetables, and organ meats (liver, kidney). What side effects may I notice from receiving this medicine? Side effects that you should report to your doctor or health care professional as soon as possible:  allergic reactions like skin rash, itching or hives, swelling of the face, lips, or tongue  dizziness  facial flushing Side effects that usually do not require medical attention (report to your doctor or health care professional if they continue or are bothersome):  changes in taste  constipation  headache  nausea, vomiting  pain, redness, or irritation at site where injected This list may not describe all possible side effects. Call your doctor for medical advice about side effects. You may report side effects to FDA at 1-800-FDA-1088. Where should I keep my medicine? This drug is given in a hospital or clinic and will not be stored at home. NOTE: This sheet is a summary. It may not cover all possible information. If you have questions about this medicine, talk to your doctor, pharmacist, or health care provider.  2021 Elsevier/Gold Standard (2020-09-30 14:00:47)  

## 2021-02-13 ENCOUNTER — Encounter: Payer: Self-pay | Admitting: Family

## 2021-02-13 ENCOUNTER — Inpatient Hospital Stay: Payer: Medicare HMO | Attending: Hematology & Oncology

## 2021-02-13 ENCOUNTER — Other Ambulatory Visit: Payer: Self-pay

## 2021-02-13 ENCOUNTER — Inpatient Hospital Stay: Payer: Medicare HMO

## 2021-02-13 ENCOUNTER — Inpatient Hospital Stay (HOSPITAL_BASED_OUTPATIENT_CLINIC_OR_DEPARTMENT_OTHER): Payer: Medicare HMO | Admitting: Family

## 2021-02-13 VITALS — BP 129/84 | HR 63 | Temp 98.3°F | Resp 18 | Wt 205.0 lb

## 2021-02-13 DIAGNOSIS — D500 Iron deficiency anemia secondary to blood loss (chronic): Secondary | ICD-10-CM

## 2021-02-13 DIAGNOSIS — C7A8 Other malignant neuroendocrine tumors: Secondary | ICD-10-CM | POA: Insufficient documentation

## 2021-02-13 DIAGNOSIS — C7B8 Other secondary neuroendocrine tumors: Secondary | ICD-10-CM | POA: Diagnosis not present

## 2021-02-13 LAB — CBC WITH DIFFERENTIAL (CANCER CENTER ONLY)
Abs Immature Granulocytes: 0.02 10*3/uL (ref 0.00–0.07)
Basophils Absolute: 0.1 10*3/uL (ref 0.0–0.1)
Basophils Relative: 1 %
Eosinophils Absolute: 0.3 10*3/uL (ref 0.0–0.5)
Eosinophils Relative: 4 %
HCT: 42.1 % (ref 39.0–52.0)
Hemoglobin: 14 g/dL (ref 13.0–17.0)
Immature Granulocytes: 0 %
Lymphocytes Relative: 16 %
Lymphs Abs: 1.1 10*3/uL (ref 0.7–4.0)
MCH: 30 pg (ref 26.0–34.0)
MCHC: 33.3 g/dL (ref 30.0–36.0)
MCV: 90.1 fL (ref 80.0–100.0)
Monocytes Absolute: 0.5 10*3/uL (ref 0.1–1.0)
Monocytes Relative: 7 %
Neutro Abs: 5 10*3/uL (ref 1.7–7.7)
Neutrophils Relative %: 72 %
Platelet Count: 156 10*3/uL (ref 150–400)
RBC: 4.67 MIL/uL (ref 4.22–5.81)
RDW: 14.6 % (ref 11.5–15.5)
WBC Count: 7 10*3/uL (ref 4.0–10.5)
nRBC: 0 % (ref 0.0–0.2)

## 2021-02-13 LAB — IRON AND TIBC
Iron: 104 ug/dL (ref 42–163)
Saturation Ratios: 35 % (ref 20–55)
TIBC: 295 ug/dL (ref 202–409)
UIBC: 191 ug/dL (ref 117–376)

## 2021-02-13 LAB — CMP (CANCER CENTER ONLY)
ALT: 17 U/L (ref 0–44)
AST: 25 U/L (ref 15–41)
Albumin: 4.2 g/dL (ref 3.5–5.0)
Alkaline Phosphatase: 53 U/L (ref 38–126)
Anion gap: 7 (ref 5–15)
BUN: 17 mg/dL (ref 8–23)
CO2: 30 mmol/L (ref 22–32)
Calcium: 9.4 mg/dL (ref 8.9–10.3)
Chloride: 100 mmol/L (ref 98–111)
Creatinine: 0.95 mg/dL (ref 0.61–1.24)
GFR, Estimated: >60 mL/min (ref >60)
Glucose, Bld: 118 mg/dL — ABNORMAL HIGH (ref 70–99)
Potassium: 4 mmol/L (ref 3.5–5.1)
Sodium: 137 mmol/L (ref 135–145)
Total Bilirubin: 0.8 mg/dL (ref 0.3–1.2)
Total Protein: 7.5 g/dL (ref 6.5–8.1)

## 2021-02-13 LAB — LACTATE DEHYDROGENASE: LDH: 184 U/L (ref 98–192)

## 2021-02-13 LAB — FERRITIN: Ferritin: 723 ng/mL — ABNORMAL HIGH (ref 24–336)

## 2021-02-13 MED ORDER — LANREOTIDE ACETATE 120 MG/0.5ML ~~LOC~~ SOLN
120.0000 mg | Freq: Once | SUBCUTANEOUS | Status: AC
  Administered 2021-02-13: 120 mg via SUBCUTANEOUS

## 2021-02-13 MED ORDER — LANREOTIDE ACETATE 120 MG/0.5ML ~~LOC~~ SOLN
SUBCUTANEOUS | Status: AC
  Filled 2021-02-13: qty 120

## 2021-02-13 NOTE — Progress Notes (Signed)
Hematology and Oncology Follow Up Visit  Brian Mays [id number] [birth year] 75 y.o. 02/13/2021   Principle Diagnosis:  Metastatic low grade neuroendocrine tumor-hepatic metastases Iron deficiencyanemia  Past Therapy: S/p yttrium-90 intrahepatic therapy - November 2016 Lutathera (Lu 177) injection on 02/01/2018 - s/p cycle 4  Current Therapy: Somatuline 120 mg monthly IV Ironas indicated   Interim History:  Brian Mays is here today for follow-up and treatment. He is doing well and staying quite busy golfing and enjoying the beautiful weather.  He has some mild fatigue and SOB at times and will take a break to rest when needed.  He noted that this improved after getting IV iron recently.  Chromogranin A last month was 135. Today's result pending.  No fever, chills, n/v, cough, rash, dizziness, chest pain, palpitations, abdominal pain/bloating or changes in bowel or bladder habits.  No swelling, tenderness, numbness or tingling in his extremities.  No falls or syncope.  He has maintained a good appetite and is staying well hydrated. His weight is stable at 205 lbs.   ECOG Performance Status: 1 - Symptomatic but completely ambulatory  Medications:  Allergies as of 02/13/2021      Reactions   Iohexol Hives    Code: HIVES, Desc: PER [name] @ PRIMARY CARE, PT IS ALLERGIC TO CONTRAST DYE 10/02/08/RM  05/01/10...needs full premeds per our protocol w/ [facility]., Onset Date: [id number]      Medication List       Accurate as of February 13, 2021  2:16 PM. If you have any questions, ask your nurse or doctor.        aspirin EC 81 MG tablet Take 81 mg by mouth every morning.   atorvastatin 20 MG tablet Commonly known as: LIPITOR TAKE 1 TABLET (20 MG TOTAL) BY MOUTH DAILY AT 6 PM.   Cholecalciferol 25 MCG (1000 UT) tablet Take 1,000 Units by mouth daily.   clopidogrel 75 MG tablet Commonly known as: PLAVIX TAKE 1 TABLET BY MOUTH EVERY DAY   fluticasone 50 MCG/ACT nasal  spray Commonly known as: FLONASE Place 2 sprays into both nostrils daily as needed (sinuses).   hydrochlorothiazide 25 MG tablet Commonly known as: HYDRODIURIL TAKE 1/2 TABLET BY MOUTH EVERY DAY   hydrocortisone 25 MG suppository Commonly known as: ANUSOL-HC INSERT 1 SUPPOSITORY RECTALLY TWICE A DAY AS NEEDED FOR HEMORRHOIDS   ipratropium 0.03 % nasal spray Commonly known as: Atrovent Place 2 sprays into the nose 3 (three) times daily.   lansoprazole 30 MG capsule Commonly known as: PREVACID Take 1 capsule (30 mg total) by mouth daily at 12 noon.   levothyroxine 50 MCG tablet Commonly known as: SYNTHROID Take 1 tablet (50 mcg total) by mouth daily before breakfast.   losartan 100 MG tablet Commonly known as: COZAAR TAKE 1 TABLET BY MOUTH EVERY DAY   metoprolol tartrate 25 MG tablet Commonly known as: LOPRESSOR TAKE 0.5 TABLETS (12.5 MG TOTAL) BY MOUTH 2 (TWO) TIMES DAILY.   nitroGLYCERIN 0.4 MG SL tablet Commonly known as: NITROSTAT PLACE 1 TABLET UNDER THE TONGUE EVERY 5 (FIVE) MINUTES X 3 DOSES AS NEEDED FOR CHEST PAIN.   sodium chloride 0.65 % Soln nasal spray Commonly known as: OCEAN Place 1 spray into both nostrils as needed for congestion.   SOMATULINE DEPOT [place] Inject 120 mcg into the skin every 28 (twenty-eight) days. Receives at Dr Antonieta Pert office   vitamin B-12 1000 MCG tablet Commonly known as: CYANOCOBALAMIN Take 1,000 mcg by mouth every evening.  Allergies:  Allergies  Allergen Reactions  . Iohexol Hives     Code: HIVES, Desc: PER [name] @ PRIMARY CARE, PT IS ALLERGIC TO CONTRAST DYE 10/02/08/RM  05/01/10...needs full premeds per our protocol w/ [facility]., Onset Date: [id number]     Past Medical History, Surgical history, Social history, and Family History were reviewed and updated.  Review of Systems: All other 10 point review of systems is negative.   Physical Exam:  weight is 205 lb (93 kg). His oral temperature is 98.3 F (36.8 C).  His blood pressure is 129/84 and his pulse is 63. His respiration is 18 and oxygen saturation is 100%.   Wt Readings from Last 3 Encounters:  02/13/21 205 lb (93 kg)  01/12/21 209 lb 1.3 oz (94.8 kg)  12/15/20 208 lb (94.3 kg)    Ocular: Sclerae unicteric, pupils equal, round and reactive to light Ear-nose-throat: Oropharynx clear, dentition fair Lymphatic: No cervical or supraclavicular adenopathy Lungs no rales or rhonchi, good excursion bilaterally Heart regular rate and rhythm, no murmur appreciated Abd soft, nontender, positive bowel sounds MSK no focal spinal tenderness, no joint edema Neuro: non-focal, well-oriented, appropriate affect Breasts: Deferred   Lab Results  Component Value Date   WBC 7.0 02/13/2021   HGB 14.0 02/13/2021   HCT 42.1 02/13/2021   MCV 90.1 02/13/2021   PLT 156 02/13/2021   Lab Results  Component Value Date   FERRITIN 97 01/12/2021   IRON 54 01/12/2021   TIBC 338 01/12/2021   UIBC 284 01/12/2021   IRONPCTSAT 16 (L) 01/12/2021   Lab Results  Component Value Date   RBC 4.67 02/13/2021   No results found for: KPAFRELGTCHN, LAMBDASER, KAPLAMBRATIO No results found for: IGGSERUM, IGA, IGMSERUM No results found for: Odetta Pink, SPEI   Chemistry      Component Value Date/Time   NA 137 02/13/2021 1306   NA 138 09/15/2018 0934   NA 144 10/04/2017 0904   NA 141 07/23/2016 1255   K 4.0 02/13/2021 1306   K 3.8 10/04/2017 0904   K 4.1 07/23/2016 1255   CL 100 02/13/2021 1306   CL 103 10/04/2017 0904   CO2 30 02/13/2021 1306   CO2 28 10/04/2017 0904   CO2 27 07/23/2016 1255   BUN 17 02/13/2021 1306   BUN 14 09/15/2018 0934   BUN 14 10/04/2017 0904   BUN 13.8 07/23/2016 1255   CREATININE 0.95 02/13/2021 1306   CREATININE 0.8 10/04/2017 0904   CREATININE 1.0 07/23/2016 1255      Component Value Date/Time   CALCIUM 9.4 02/13/2021 1306   CALCIUM 9.4 10/04/2017 0904   CALCIUM 9.2  07/23/2016 1255   ALKPHOS 53 02/13/2021 1306   ALKPHOS 64 10/04/2017 0904   ALKPHOS 77 07/23/2016 1255   AST 25 02/13/2021 1306   AST 23 07/23/2016 1255   ALT 17 02/13/2021 1306   ALT 22 10/04/2017 0904   ALT 13 07/23/2016 1255   BILITOT 0.8 02/13/2021 1306   BILITOT 1.13 07/23/2016 1255       Impression and Plan: Brian Mays isa very pleasant 75 yo caucasian gentleman withmetastatic low-grade neuroendocrine carcinoma withhepatic metastasis. He received Somatiline as planned.  Iron studies are pending.  Follow-up in 1 month.  He can contact our office with questions or concerns.   Laverna Peace, NP 4/15/20222:16 PM

## 2021-02-16 ENCOUNTER — Telehealth: Payer: Self-pay

## 2021-02-16 NOTE — Telephone Encounter (Signed)
sw pt per sch message. Los and he is aware of his f/u appt  Brian Mays

## 2021-02-19 LAB — CHROMOGRANIN A: Chromogranin A (ng/mL): 137.3 ng/mL — ABNORMAL HIGH (ref 0.0–101.8)

## 2021-03-17 ENCOUNTER — Inpatient Hospital Stay (HOSPITAL_BASED_OUTPATIENT_CLINIC_OR_DEPARTMENT_OTHER): Payer: Medicare HMO | Admitting: Family

## 2021-03-17 ENCOUNTER — Other Ambulatory Visit: Payer: Self-pay

## 2021-03-17 ENCOUNTER — Encounter: Payer: Self-pay | Admitting: Family

## 2021-03-17 ENCOUNTER — Inpatient Hospital Stay: Payer: Medicare HMO | Attending: Hematology & Oncology

## 2021-03-17 ENCOUNTER — Inpatient Hospital Stay: Payer: Medicare HMO

## 2021-03-17 VITALS — BP 117/58 | HR 64 | Temp 98.0°F | Resp 18 | Ht 70.5 in | Wt 203.1 lb

## 2021-03-17 DIAGNOSIS — C7A8 Other malignant neuroendocrine tumors: Secondary | ICD-10-CM | POA: Diagnosis not present

## 2021-03-17 DIAGNOSIS — D500 Iron deficiency anemia secondary to blood loss (chronic): Secondary | ICD-10-CM | POA: Diagnosis not present

## 2021-03-17 DIAGNOSIS — C7B8 Other secondary neuroendocrine tumors: Secondary | ICD-10-CM

## 2021-03-17 LAB — CBC WITH DIFFERENTIAL (CANCER CENTER ONLY)
Abs Immature Granulocytes: 0.03 10*3/uL (ref 0.00–0.07)
Basophils Absolute: 0.1 10*3/uL (ref 0.0–0.1)
Basophils Relative: 1 %
Eosinophils Absolute: 0.4 10*3/uL (ref 0.0–0.5)
Eosinophils Relative: 5 %
HCT: 42.3 % (ref 39.0–52.0)
Hemoglobin: 14 g/dL (ref 13.0–17.0)
Immature Granulocytes: 0 %
Lymphocytes Relative: 13 %
Lymphs Abs: 1 10*3/uL (ref 0.7–4.0)
MCH: 30.4 pg (ref 26.0–34.0)
MCHC: 33.1 g/dL (ref 30.0–36.0)
MCV: 92 fL (ref 80.0–100.0)
Monocytes Absolute: 0.5 10*3/uL (ref 0.1–1.0)
Monocytes Relative: 7 %
Neutro Abs: 5.9 10*3/uL (ref 1.7–7.7)
Neutrophils Relative %: 74 %
Platelet Count: 154 10*3/uL (ref 150–400)
RBC: 4.6 MIL/uL (ref 4.22–5.81)
RDW: 14.6 % (ref 11.5–15.5)
WBC Count: 7.9 10*3/uL (ref 4.0–10.5)
nRBC: 0 % (ref 0.0–0.2)

## 2021-03-17 LAB — CMP (CANCER CENTER ONLY)
ALT: 18 U/L (ref 0–44)
AST: 25 U/L (ref 15–41)
Albumin: 4.3 g/dL (ref 3.5–5.0)
Alkaline Phosphatase: 63 U/L (ref 38–126)
Anion gap: 6 (ref 5–15)
BUN: 18 mg/dL (ref 8–23)
CO2: 33 mmol/L — ABNORMAL HIGH (ref 22–32)
Calcium: 10.1 mg/dL (ref 8.9–10.3)
Chloride: 100 mmol/L (ref 98–111)
Creatinine: 1.02 mg/dL (ref 0.61–1.24)
GFR, Estimated: >60 mL/min (ref >60)
Glucose, Bld: 88 mg/dL (ref 70–99)
Potassium: 4.4 mmol/L (ref 3.5–5.1)
Sodium: 139 mmol/L (ref 135–145)
Total Bilirubin: 0.9 mg/dL (ref 0.3–1.2)
Total Protein: 7.3 g/dL (ref 6.5–8.1)

## 2021-03-17 LAB — LACTATE DEHYDROGENASE: LDH: 186 U/L (ref 98–192)

## 2021-03-17 MED ORDER — LANREOTIDE ACETATE 120 MG/0.5ML ~~LOC~~ SOLN
SUBCUTANEOUS | Status: AC
  Filled 2021-03-17: qty 120

## 2021-03-17 MED ORDER — LANREOTIDE ACETATE 120 MG/0.5ML ~~LOC~~ SOLN
120.0000 mg | Freq: Once | SUBCUTANEOUS | Status: AC
  Administered 2021-03-17: 120 mg via SUBCUTANEOUS

## 2021-03-17 NOTE — Progress Notes (Signed)
Hematology and Oncology Follow Up Visit  Brian Mays [id number] 1945/12/27 75 y.o. 03/17/2021   Principle Diagnosis:  Metastatic low grade neuroendocrine tumor-hepatic metastases Iron deficiencyanemia  Past Therapy: S/p yttrium-90 intrahepatic therapy - November 2016 Lutathera (Lu 177) injection on 02/01/2018 - s/p cycle 4  Current Therapy: Somatuline 120 mg monthly IV Ironas indicated   Interim History:  [name] is here today for follow-up and treatment. He is doing well but had some aggravated lower back pain after a long car ride to [place] for funeral and doing some yard work. He states that he walked 5 miles this morning and states that his back feels much better.  He is playing golf during the week as well.  Chromogranin A level last month was stable at 137.  No fever, chills, n/v, cough, rash, dizziness, SOB, chest pain, palpitations, abdominal pain or changes in bowel or bladder habits.  No swelling or tenderness in her extremities. No falls or syncope.  He is still eating well and staying hydrated. His weight is stable at 202 lbs.  ECOG Performance Status: 1 - Symptomatic but completely ambulatory  Medications:  Allergies as of 03/17/2021      Reactions   Iohexol Hives    Code: HIVES, Desc: PER [name] @ PRIMARY CARE, PT IS ALLERGIC TO CONTRAST DYE 10/02/08/RM  05/01/10...needs full premeds per our protocol w/ [facility]., Onset Date: [id number]      Medication List       Accurate as of Mar 17, 2021 11:16 AM. If you have any questions, ask your nurse or doctor.        aspirin EC 81 MG tablet Take 81 mg by mouth every morning.   atorvastatin 20 MG tablet Commonly known as: LIPITOR TAKE 1 TABLET (20 MG TOTAL) BY MOUTH DAILY AT 6 PM.   Cholecalciferol 25 MCG (1000 UT) tablet Take 1,000 Units by mouth daily.   clopidogrel 75 MG tablet Commonly known as: PLAVIX TAKE 1 TABLET BY MOUTH EVERY DAY   fluticasone 50 MCG/ACT nasal spray Commonly known  as: FLONASE Place 2 sprays into both nostrils daily as needed (sinuses).   hydrochlorothiazide 25 MG tablet Commonly known as: HYDRODIURIL TAKE 1/2 TABLET BY MOUTH EVERY DAY   hydrocortisone 25 MG suppository Commonly known as: ANUSOL-HC INSERT 1 SUPPOSITORY RECTALLY TWICE A DAY AS NEEDED FOR HEMORRHOIDS   ipratropium 0.03 % nasal spray Commonly known as: Atrovent Place 2 sprays into the nose 3 (three) times daily.   lansoprazole 30 MG capsule Commonly known as: PREVACID Take 1 capsule (30 mg total) by mouth daily at 12 noon.   levothyroxine 50 MCG tablet Commonly known as: SYNTHROID Take 1 tablet (50 mcg total) by mouth daily before breakfast.   losartan 100 MG tablet Commonly known as: COZAAR TAKE 1 TABLET BY MOUTH EVERY DAY   metoprolol tartrate 25 MG tablet Commonly known as: LOPRESSOR TAKE 0.5 TABLETS (12.5 MG TOTAL) BY MOUTH 2 (TWO) TIMES DAILY.   nitroGLYCERIN 0.4 MG SL tablet Commonly known as: NITROSTAT PLACE 1 TABLET UNDER THE TONGUE EVERY 5 (FIVE) MINUTES X 3 DOSES AS NEEDED FOR CHEST PAIN.   sodium chloride 0.65 % Soln nasal spray Commonly known as: OCEAN Place 1 spray into both nostrils as needed for congestion.   SOMATULINE DEPOT [place] Inject 120 mcg into the skin every 28 (twenty-eight) days. Receives at Dr Antonieta Pert office   vitamin B-12 1000 MCG tablet Commonly known as: CYANOCOBALAMIN Take 1,000 mcg by mouth every evening.  Allergies:  Allergies  Allergen Reactions  . Iohexol Hives     Code: HIVES, Desc: PER [name] @ PRIMARY CARE, PT IS ALLERGIC TO CONTRAST DYE 10/02/08/RM  05/01/10...needs full premeds per our protocol w/ [facility]., Onset Date: [id number]     Past Medical History, Surgical history, Social history, and Family History were reviewed and updated.  Review of Systems: All other 10 point review of systems is negative.   Physical Exam:  height is 5' 10.5" (1.791 m) and weight is 203 lb 1.3 oz (92.1 kg). His oral temperature is  98 F (36.7 C). His blood pressure is 117/58 (abnormal) and his pulse is 64. His respiration is 18 and oxygen saturation is 100%.   Wt Readings from Last 3 Encounters:  03/17/21 203 lb 1.3 oz (92.1 kg)  02/13/21 205 lb (93 kg)  01/12/21 209 lb 1.3 oz (94.8 kg)    Ocular: Sclerae unicteric, pupils equal, round and reactive to light Ear-nose-throat: Oropharynx clear, dentition fair Lymphatic: No cervical or supraclavicular adenopathy Lungs no rales or rhonchi, good excursion bilaterally Heart regular rate and rhythm, no murmur appreciated Abd soft, nontender, positive bowel sounds MSK no focal spinal tenderness, no joint edema Neuro: non-focal, well-oriented, appropriate affect Breasts: Deferred   Lab Results  Component Value Date   WBC 7.9 03/17/2021   HGB 14.0 03/17/2021   HCT 42.3 03/17/2021   MCV 92.0 03/17/2021   PLT 154 03/17/2021   Lab Results  Component Value Date   FERRITIN 723 (H) 02/13/2021   IRON 104 02/13/2021   TIBC 295 02/13/2021   UIBC 191 02/13/2021   IRONPCTSAT 35 02/13/2021   Lab Results  Component Value Date   RBC 4.60 03/17/2021   No results found for: KPAFRELGTCHN, LAMBDASER, KAPLAMBRATIO No results found for: Kandis Cocking, IGMSERUM No results found for: Kathrynn Ducking, MSPIKE, SPEI   Chemistry      Component Value Date/Time   NA 139 03/17/2021 1021   NA 138 09/15/2018 0934   NA 144 10/04/2017 0904   NA 141 07/23/2016 1255   K 4.4 03/17/2021 1021   K 3.8 10/04/2017 0904   K 4.1 07/23/2016 1255   CL 100 03/17/2021 1021   CL 103 10/04/2017 0904   CO2 33 (H) 03/17/2021 1021   CO2 28 10/04/2017 0904   CO2 27 07/23/2016 1255   BUN 18 03/17/2021 1021   BUN 14 09/15/2018 0934   BUN 14 10/04/2017 0904   BUN 13.8 07/23/2016 1255   CREATININE 1.02 03/17/2021 1021   CREATININE 0.8 10/04/2017 0904   CREATININE 1.0 07/23/2016 1255      Component Value Date/Time   CALCIUM 10.1 03/17/2021 1021    CALCIUM 9.4 10/04/2017 0904   CALCIUM 9.2 07/23/2016 1255   ALKPHOS 63 03/17/2021 1021   ALKPHOS 64 10/04/2017 0904   ALKPHOS 77 07/23/2016 1255   AST 25 03/17/2021 1021   AST 23 07/23/2016 1255   ALT 18 03/17/2021 1021   ALT 22 10/04/2017 0904   ALT 13 07/23/2016 1255   BILITOT 0.9 03/17/2021 1021   BILITOT 1.13 07/23/2016 1255       Impression and Plan: Mr. Gales isa very pleasant 75 yo caucasian gentleman withmetastatic low-grade neuroendocrine carcinoma withhepatic metastasis. Somatuline given today as planned.  Iron studies are pending. We will replace if needed.  Follow-up in 1 months.  He can contact our office with any questions or concerns.   Laverna Peace, NP 5/17/202211:16 AM

## 2021-03-18 ENCOUNTER — Telehealth: Payer: Self-pay | Admitting: *Deleted

## 2021-03-18 LAB — IRON AND TIBC
Iron: 72 ug/dL (ref 42–163)
Saturation Ratios: 24 % (ref 20–55)
TIBC: 298 ug/dL (ref 202–409)
UIBC: 225 ug/dL (ref 117–376)

## 2021-03-18 LAB — FERRITIN: Ferritin: 333 ng/mL (ref 24–336)

## 2021-03-18 NOTE — Telephone Encounter (Signed)
Per 03/17/21 loss - called and gave upcoming appointments - view mychart

## 2021-03-19 ENCOUNTER — Encounter: Payer: Self-pay | Admitting: Family Medicine

## 2021-03-19 LAB — CHROMOGRANIN A: Chromogranin A (ng/mL): 137.8 ng/mL — ABNORMAL HIGH (ref 0.0–101.8)

## 2021-04-01 ENCOUNTER — Encounter: Payer: Self-pay | Admitting: Family Medicine

## 2021-04-02 ENCOUNTER — Other Ambulatory Visit: Payer: Self-pay

## 2021-04-02 ENCOUNTER — Ambulatory Visit (INDEPENDENT_AMBULATORY_CARE_PROVIDER_SITE_OTHER): Payer: Medicare HMO | Admitting: Family Medicine

## 2021-04-02 ENCOUNTER — Encounter: Payer: Self-pay | Admitting: Family Medicine

## 2021-04-02 VITALS — BP 136/62 | HR 67 | Temp 97.5°F | Resp 16 | Ht 70.5 in | Wt 206.0 lb

## 2021-04-02 DIAGNOSIS — S76191A Other specified injury of right quadriceps muscle, fascia and tendon, initial encounter: Secondary | ICD-10-CM | POA: Diagnosis not present

## 2021-04-02 NOTE — Patient Instructions (Signed)
Good to see you today- you are looking great!  I think you injured your right quad muscle on your long walk last week  Continue relative rest, you can also use ice or a compression sleeve if you like  If you are not continuing to make progress please contact me next week and I will have you see ortho or sports med

## 2021-04-02 NOTE — Progress Notes (Signed)
[facility] [place], [place], [place] [address] [phone]  Date:  04/02/2021   Name:  Brian Mays   DOB:  Oct 06, 1946   MRN:  [id number]  PCP:  Darreld Mclean, MD    Chief Complaint: Leg Pain (Right leg pain, possible muscle strain, Sunday after long walk, swelling)   History of Present Illness:  [name] is a 75 y.o. very pleasant male patient who presents with the following:  He has history of metastatic malignant neuroendocrine tumor in the liver as well as iron absorption, CAD status post stenting in 2017, hypothyroidism, hypertension, chronic pancreatitis Last visit with myself last September- here today with concern of possible right quadricep injury  Oncology visit last month - all is stable   He walked about 5 miles over the weekend on trails near his home He felt fine during the hike- but the next day he notes some tenderness and swelling in the right distal quad just above the knee He did not have any pain while he was walking so he is not aware of any injury Since he first noted the swelling, his leg is gotten better-still just lightly sore The swelling is much better today It does not seem weak at all  Patient Active Problem List   Diagnosis Date Noted  . Near syncope 04/22/2020  . Iron deficiency anemia due to chronic blood loss 03/29/2019  . 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 Lumbar radiculopathy 08/16/2017  . CAD (coronary artery disease) 09/21/2016  . Unstable angina ([place]) 02/09/2016  . Vitamin D deficiency 03/11/2015  . Metastatic malignant neuroendocrine tumor to liver ([place]) 02/14/2015  . Hyperglycemia 04/03/2012  . Diarrhea 03/29/2012  . B12 deficiency 04/19/2011  . TOBACCO USE, QUIT 10/13/2009  . Unspecified vitamin D deficiency 06/19/2009  . Neoplasm by body site 12/23/2008  . DIVERTICULOSIS, COLON 11/08/2008  . ABDOMINAL PAIN 10/02/2008  . Hypothyroidism 12/13/2007  .  HEMORRHOIDS, NOS 12/13/2007  . COLONIC POLYPS, HX OF 12/13/2007  . Essential hypertension 09/15/2007  . GERD 09/15/2007  . PANCREATITIS, CHRONIC 09/15/2007  . CARDIAC MURMUR 09/15/2007  . SNORING 09/15/2007    Past Medical History:  Diagnosis Date  . Anemia    in past  . Colon polyps   . Diverticulosis   . Gallstones   . GERD (gastroesophageal reflux disease)   . Heart attack ([place])    mild, Spring 2017  . Heart murmur   . Hemorrhoid   . Hiatal hernia   . HTN (hypertension)   . Hypothyroidism   . Iron deficiency anemia due to chronic blood loss 03/29/2019  . 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 Metastatic carcinoma ([place]) 2010   Dr Jonette Eva  . Pancreatitis 1998   chronic    Past Surgical History:  Procedure Laterality Date  . APPENDECTOMY  1962  . CARDIAC CATHETERIZATION N/A 02/10/2016   Procedure: Left Heart Cath and Coronary Angiography;  Surgeon: Adrian Prows, MD;  Location: [facility];  Service: Cardiovascular;  Laterality: N/A;  . CARDIAC CATHETERIZATION  02/10/2016   Procedure: Coronary/Graft Atherectomy;  Surgeon: Adrian Prows, MD;  Location: [facility];  Service: Cardiovascular;;  . CARDIAC CATHETERIZATION  02/10/2016   Procedure: Coronary Stent Intervention;  Surgeon: Adrian Prows, MD;  Location: [facility];  Service: Cardiovascular;;  . CATARACT EXTRACTION W/ INTRAOCULAR LENS  IMPLANT, BILATERAL Bilateral   . IR GENERIC HISTORICAL  12/30/2015   IR RADIOLOGIST  EVAL & MGMT 12/30/2015 Aletta Edouard, MD [facility] INTER[facility] RAD  . IR GENERIC HISTORICAL  11/09/2016   IR RADIOLOGIST EVAL & MGMT 11/09/2016 Aletta Edouard, MD [facility] INTERV RAD  . IR RADIOLOGIST EVAL & MGMT  03/01/2017  . IR RADIOLOGIST EVAL & MGMT  06/29/2017  . LAPAROSCOPIC CHOLECYSTECTOMY  1999  . LIVER BIOPSY  2010  . RADIOACTIVE SEED IMPLANT  X 3   "to my liver"  . TUMOR EXCISION  01/2009   Carcinoil Resection   . TUMOR REMOVAL     from small intestine    Social History   Tobacco Use  . Smoking  status: Former Smoker    Packs/day: 1.50    Years: 35.00    Pack years: 52.50    Types: Cigarettes    Start date: 09/25/1959    Quit date: 12/21/1997    Years since quitting: 23.2  . Smokeless tobacco: Never Used  Vaping Use  . Vaping Use: Never used  Substance Use Topics  . Alcohol use: Yes    Alcohol/week: 3.0 - 4.0 standard drinks    Types: 3 - 4 Cans of beer per week    Comment: 4 beers weekly   . Drug use: No    Family History  Problem Relation Age of Onset  . Kidney disease Mother   . Hyperlipidemia Mother   . Hypertension Mother   . COPD Father   . Ulcerative colitis Daughter     Allergies  Allergen Reactions  . Iohexol Hives     Code: HIVES, Desc: PER [name] @ PRIMARY CARE, PT IS ALLERGIC TO CONTRAST DYE 10/02/08/RM  05/01/10...needs full premeds per our protocol w/ [facility]., Onset Date: [id number]     Medication list has been reviewed and updated.  Current Outpatient Medications on File Prior to Visit  Medication Sig Dispense Refill  . aspirin EC 81 MG tablet Take 81 mg by mouth every morning.    [name] atorvastatin (LIPITOR) 20 MG tablet TAKE 1 TABLET (20 MG TOTAL) BY MOUTH DAILY AT 6 PM. 90 tablet 2  . Cholecalciferol 1000 UNITS tablet Take 1,000 Units by mouth daily.    . clopidogrel (PLAVIX) 75 MG tablet TAKE 1 TABLET BY MOUTH EVERY DAY 90 tablet 2  . fluticasone (FLONASE) 50 MCG/ACT nasal spray Place 2 sprays into both nostrils daily as needed (sinuses).   11  . hydrochlorothiazide (HYDRODIURIL) 25 MG tablet TAKE 1/2 TABLET BY MOUTH EVERY DAY 45 tablet 3  . hydrocortisone (ANUSOL-HC) 25 MG suppository INSERT 1 SUPPOSITORY RECTALLY TWICE A DAY AS NEEDED FOR HEMORRHOIDS 30 suppository 0  . ipratropium (ATROVENT) 0.03 % nasal spray Place 2 sprays into the nose 3 (three) times daily. 30 mL 6  . Lanreotide Acetate (SOMATULINE DEPOT [place]) Inject 120 mcg into the skin every 28 (twenty-eight) days. Receives at Dr Antonieta Pert office    . lansoprazole (PREVACID) 30 MG capsule  Take 1 capsule (30 mg total) by mouth daily at 12 noon. 90 capsule 3  . levothyroxine (SYNTHROID) 50 MCG tablet Take 1 tablet (50 mcg total) by mouth daily before breakfast. 90 tablet 3  . losartan (COZAAR) 100 MG tablet TAKE 1 TABLET BY MOUTH EVERY DAY 90 tablet 2  . metoprolol tartrate (LOPRESSOR) 25 MG tablet TAKE 0.5 TABLETS (12.5 MG TOTAL) BY MOUTH 2 (TWO) TIMES DAILY. 90 tablet 2  . nitroGLYCERIN (NITROSTAT) 0.4 MG SL tablet PLACE 1 TABLET UNDER THE TONGUE EVERY 5 (FIVE) MINUTES X 3 DOSES AS NEEDED FOR CHEST PAIN. 25  tablet 8  . sodium chloride (OCEAN) 0.65 % SOLN nasal spray Place 1 spray into both nostrils as needed for congestion.     . vitamin B-12 (CYANOCOBALAMIN) 1000 MCG tablet Take 1,000 mcg by mouth every evening.      No current facility-administered medications on file prior to visit.    Review of Systems:  As per HPI- otherwise negative.   Physical Examination: Vitals:   04/02/21 1507  BP: 136/62  Pulse: 67  Resp: 16  Temp: (!) 97.5 F (36.4 C)  SpO2: 98%   Vitals:   04/02/21 1507  Weight: 206 lb (93.4 kg)  Height: 5' 10.5" (1.791 m)   Body mass index is 29.14 kg/m. Ideal Body Weight: Weight in (lb) to have BMI = 25: 176.4  GEN: no acute distress.  Mild overweight, looks well HEENT: Atraumatic, Normocephalic.  Ears and Nose: No external deformity. CV: RRR, No M/G/R. No JVD. No thrill. No extra heart sounds. PULM: CTA B, no wheezes, crackles, rhonchi. No retractions. No resp. distress. No accessory muscle use. ABD: S, NT, ND EXTR: No c/c/e PSYCH: Normally interactive. Conversant.  There is some puffiness of the right knee, this may represent a joint effusion or some fluid from the distal quadriceps.  The area appears minimally swollen.  It is not hot or red.  There is not any significant tenderness at this time.  Normal range of motion of the knee Normal strength of the quadriceps and hamstrings   Assessment and Plan: Other specified injury of right  quadriceps muscle, fascia and tendon, initial encounter   Patient is here today for follow-up of an apparent right quadriceps injury.  He took a relatively long hike over the weekend, noted swelling the next day.  Over the last several days it has improved but not entirely resolved.  He has no weakness or appreciable tenderness today.  Suspect that he tore some fibers of the quadriceps and may have had some bleeding.  Since he is so much better today we hope he will continue to improve and get back to normal.  However, I have asked him to let me know if not continue to improve, if symptoms persist we can refer to sports medicine.  I asked him to use ice for 15 to 20 minutes several times a day, can also use a compression sleeve if helpful  This visit occurred during the SARS-CoV-2 public health emergency.  Safety protocols were in place, including screening questions prior to the visit, additional usage of staff PPE, and extensive cleaning of exam room while observing appropriate contact time as indicated for disinfecting solutions.     Signed Lamar Blinks, MD

## 2021-04-17 ENCOUNTER — Inpatient Hospital Stay (HOSPITAL_BASED_OUTPATIENT_CLINIC_OR_DEPARTMENT_OTHER): Payer: Medicare HMO | Admitting: Hematology & Oncology

## 2021-04-17 ENCOUNTER — Inpatient Hospital Stay: Payer: Medicare HMO

## 2021-04-17 ENCOUNTER — Other Ambulatory Visit: Payer: Self-pay

## 2021-04-17 ENCOUNTER — Inpatient Hospital Stay: Payer: Medicare HMO | Attending: Hematology & Oncology

## 2021-04-17 ENCOUNTER — Telehealth: Payer: Self-pay | Admitting: *Deleted

## 2021-04-17 ENCOUNTER — Encounter: Payer: Self-pay | Admitting: Hematology & Oncology

## 2021-04-17 VITALS — BP 134/65 | HR 62 | Temp 98.4°F | Resp 16 | Wt 205.0 lb

## 2021-04-17 DIAGNOSIS — C7B8 Other secondary neuroendocrine tumors: Secondary | ICD-10-CM

## 2021-04-17 DIAGNOSIS — D500 Iron deficiency anemia secondary to blood loss (chronic): Secondary | ICD-10-CM

## 2021-04-17 DIAGNOSIS — D509 Iron deficiency anemia, unspecified: Secondary | ICD-10-CM | POA: Diagnosis not present

## 2021-04-17 DIAGNOSIS — C7A8 Other malignant neuroendocrine tumors: Secondary | ICD-10-CM | POA: Diagnosis not present

## 2021-04-17 LAB — CBC WITH DIFFERENTIAL (CANCER CENTER ONLY)
Abs Immature Granulocytes: 0.03 10*3/uL (ref 0.00–0.07)
Basophils Absolute: 0.1 10*3/uL (ref 0.0–0.1)
Basophils Relative: 1 %
Eosinophils Absolute: 0.3 10*3/uL (ref 0.0–0.5)
Eosinophils Relative: 4 %
HCT: 40.2 % (ref 39.0–52.0)
Hemoglobin: 13.3 g/dL (ref 13.0–17.0)
Immature Granulocytes: 1 %
Lymphocytes Relative: 16 %
Lymphs Abs: 1.1 10*3/uL (ref 0.7–4.0)
MCH: 30.7 pg (ref 26.0–34.0)
MCHC: 33.1 g/dL (ref 30.0–36.0)
MCV: 92.8 fL (ref 80.0–100.0)
Monocytes Absolute: 0.6 10*3/uL (ref 0.1–1.0)
Monocytes Relative: 9 %
Neutro Abs: 4.6 10*3/uL (ref 1.7–7.7)
Neutrophils Relative %: 69 %
Platelet Count: 141 10*3/uL — ABNORMAL LOW (ref 150–400)
RBC: 4.33 MIL/uL (ref 4.22–5.81)
RDW: 13.8 % (ref 11.5–15.5)
WBC Count: 6.6 10*3/uL (ref 4.0–10.5)
nRBC: 0 % (ref 0.0–0.2)

## 2021-04-17 LAB — CMP (CANCER CENTER ONLY)
ALT: 15 U/L (ref 0–44)
AST: 21 U/L (ref 15–41)
Albumin: 4.1 g/dL (ref 3.5–5.0)
Alkaline Phosphatase: 60 U/L (ref 38–126)
Anion gap: 5 (ref 5–15)
BUN: 17 mg/dL (ref 8–23)
CO2: 32 mmol/L (ref 22–32)
Calcium: 9.9 mg/dL (ref 8.9–10.3)
Chloride: 98 mmol/L (ref 98–111)
Creatinine: 0.95 mg/dL (ref 0.61–1.24)
GFR, Estimated: >60 mL/min (ref >60)
Glucose, Bld: 111 mg/dL — ABNORMAL HIGH (ref 70–99)
Potassium: 4.2 mmol/L (ref 3.5–5.1)
Sodium: 135 mmol/L (ref 135–145)
Total Bilirubin: 0.7 mg/dL (ref 0.3–1.2)
Total Protein: 7.5 g/dL (ref 6.5–8.1)

## 2021-04-17 LAB — IRON AND TIBC
Iron: 57 ug/dL (ref 45–182)
Saturation Ratios: 18 % (ref 17.9–39.5)
TIBC: 325 ug/dL (ref 250–450)
UIBC: 268 ug/dL

## 2021-04-17 LAB — LACTATE DEHYDROGENASE: LDH: 163 U/L (ref 98–192)

## 2021-04-17 LAB — FERRITIN: Ferritin: 240 ng/mL (ref 24–336)

## 2021-04-17 MED ORDER — LANREOTIDE ACETATE 120 MG/0.5ML ~~LOC~~ SOLN
SUBCUTANEOUS | Status: AC
  Filled 2021-04-17: qty 120

## 2021-04-17 MED ORDER — LANREOTIDE ACETATE 120 MG/0.5ML ~~LOC~~ SOLN
120.0000 mg | Freq: Once | SUBCUTANEOUS | Status: AC
Start: 2021-04-17 — End: 2021-04-17
  Administered 2021-04-17: 120 mg via SUBCUTANEOUS

## 2021-04-17 MED ORDER — DENOSUMAB 120 MG/1.7ML ~~LOC~~ SOLN
SUBCUTANEOUS | Status: AC
  Filled 2021-04-17: qty 1.7

## 2021-04-17 NOTE — Patient Instructions (Signed)
Lanreotide injection What is this medication? LANREOTIDE (lan REE oh tide) is used to reduce blood levels of growth hormone in patients with a condition called acromegaly. It also works to slow or stop tumor growth in patients with neuroendocrine tumors and treat carcinoid syndrome. This medicine may be used for other purposes; ask your health care provider or pharmacist if you have questions. COMMON BRAND NAME(S): Somatuline [place] What should I tell my care team before I take this medication? They need to know if you have any of these conditions: diabetes gallbladder disease heart disease kidney disease liver disease thyroid disease an unusual or allergic reaction to lanreotide, other medicines, foods, dyes, or preservatives pregnant or trying to get pregnant breast-feeding How should I use this medication? This medicine is for injection under the skin. It is given by a health care professional in a hospital or clinic setting. Contact your pediatrician or health care professional regarding the use of this medicine in children. Special care may be needed. Overdosage: If you think you have taken too much of this medicine contact a poison control center or emergency room at once. NOTE: This medicine is only for you. Do not share this medicine with others. What if I miss a dose? It is important not to miss your dose. Call your doctor or health care professional if you are unable to keep an appointment. What may interact with this medication? This medicine may interact with the following medications: bromocriptine cyclosporine certain medicines for blood pressure, heart disease, irregular heart beat certain medicines for diabetes quinidine terfenadine This list may not describe all possible interactions. Give your health care provider a list of all the medicines, herbs, non-prescription drugs, or dietary supplements you use. Also tell them if you smoke, drink alcohol, or use illegal drugs.  Some items may interact with your medicine. What should I watch for while using this medication? Tell your doctor or healthcare professional if your symptoms do not start to get better or if they get worse. Visit your doctor or health care professional for regular checks on your progress. Your condition will be monitored carefully while you are receiving this medicine. This medicine may increase blood sugar. Ask your healthcare provider if changes in diet or medicines are needed if you have diabetes. You may need blood work done while you are taking this medicine. Women should inform their doctor if they wish to become pregnant or think they might be pregnant. There is a potential for serious side effects to an unborn child. Talk to your health care professional or pharmacist for more information. Do not breast-feed an infant while taking this medicine or for 6 months after stopping it. This medicine has caused ovarian failure in some women. This medicine may interfere with the ability to have a child. Talk with your doctor or health care professional if you are concerned about your fertility. What side effects may I notice from receiving this medication? Side effects that you should report to your doctor or health care professional as soon as possible: allergic reactions like skin rash, itching or hives, swelling of the face, lips, or tongue increased blood pressure severe stomach pain signs and symptoms of hgh blood sugar such as being more thirsty or hungry or having to urinate more than normal. You may also feel very tired or have blurry vision. signs and symptoms of low blood sugar such as feeling anxious; confusion; dizziness; increased hunger; unusually weak or tired; sweating; shakiness; cold; irritable; headache; blurred vision; fast   heartbeat; loss of consciousness unusually slow heartbeat Side effects that usually do not require medical attention (report to your doctor or health care  professional if they continue or are bothersome): constipation diarrhea dizziness headache muscle pain muscle spasms nausea pain, redness, or irritation at site where injected This list may not describe all possible side effects. Call your doctor for medical advice about side effects. You may report side effects to FDA at 1-800-FDA-1088. Where should I keep my medication? This drug is given in a hospital or clinic and will not be stored at home. NOTE: This sheet is a summary. It may not cover all possible information. If you have questions about this medicine, talk to your doctor, pharmacist, or health care provider.  2022 Elsevier/Gold Standard (2018-07-27 09:13:08)  

## 2021-04-17 NOTE — Telephone Encounter (Signed)
Per 04/17/21 los - called and gave up coming appointments - patient confirmed 

## 2021-04-17 NOTE — Progress Notes (Signed)
Hematology and Oncology Follow Up Visit  Brian Mays [id number] December 19, 1945 75 y.o. 04/17/2021   Principle Diagnosis:  Metastatic low grade neuroendocrine tumor-hepatic metastases Iron deficiency anemia   Past Therapy: S/p yttrium-90 intrahepatic therapy - November 2016 Lutathera (Lu 177) injection on 02/01/2018 - s/p cycle 4   Current Therapy:        Somatuline 120 mg monthly IV Iron as indicated    Interim History:  Mr. Shanholtzer is here today for follow-up.  He is doing pretty well.  He really has not no specific complaints since we last saw him.  He is playing some golf.  He did not try to play a little golf next week.  He has had no issues with nausea or vomiting.  There is been no problems with diarrhea.  Had no problems with chest wall pain.  He has had no cough.  His last chromogranin A level was stable at 138.  He has had no abdominal pain.  His last iron studies showed a ferritin of 333 with an iron saturation of 24%.  He has had no bleeding.  There is no headache.  It sounds like his wife is doing better.  I am happy about that.  She seems to be get around little bit more.  Overall, his performance status is ECOG 1.    Medications:  Allergies as of 04/17/2021       Reactions   Iohexol Hives    Code: HIVES, Desc: PER [name] @ [facility]IMARY [facility]ARE, PT IS ALLERGIC TO CONTRAST DYE 10/02/08/RM  05/01/10...needs full premeds per our protocol w/ [facility]., Onset Date: [id number]        Medication List        Accurate as of April 17, 2021  2:12 PM. If you have any questions, ask your nurse or doctor.          aspirin EC 81 MG tablet Take 81 mg by mouth every morning.   atorvastatin 20 MG tablet Commonly known as: LIPITOR TAKE 1 TABLET (20 MG TOTAL) BY MOUTH DAILY AT 6 PM.   Cholecalciferol 25 MCG (1000 UT) tablet Take 1,000 Units by mouth daily.   clopidogrel 75 MG tablet Commonly known as: PLAVIX TAKE 1 TABLET BY MOUTH EVERY DAY   fluticasone 50 MCG/ACT  nasal spray Commonly known as: FLONASE Place 2 sprays into both nostrils daily as needed (sinuses).   hydrochlorothiazide 25 MG tablet Commonly known as: HYDRODIURIL TAKE 1/2 TABLET BY MOUTH EVERY DAY   hydrocortisone 25 MG suppository Commonly known as: ANUSOL-HC INSERT 1 SUPPOSITORY RECTALLY TWICE A DAY AS NEEDED FOR HEMORRHOIDS   ipratropium 0.03 % nasal spray Commonly known as: Atrovent Place 2 sprays into the nose 3 (three) times daily.   lansoprazole 30 MG capsule Commonly known as: PREVACID Take 1 capsule (30 mg total) by mouth daily at 12 noon.   levothyroxine 50 MCG tablet Commonly known as: SYNTHROID Take 1 tablet (50 mcg total) by mouth daily before breakfast.   losartan 100 MG tablet Commonly known as: COZAAR TAKE 1 TABLET BY MOUTH EVERY DAY   metoprolol tartrate 25 MG tablet Commonly known as: LOPRESSOR TAKE 0.5 TABLETS (12.5 MG TOTAL) BY MOUTH 2 (TWO) TIMES DAILY.   nitroGLYCERIN 0.4 MG SL tablet Commonly known as: NITROSTAT PLACE 1 TABLET UNDER THE TONGUE EVERY 5 (FIVE) MINUTES X 3 DOSES AS NEEDED FOR CHEST PAIN.   sodium chloride 0.65 % Soln nasal spray Commonly known as: OCEAN Place 1 spray into both nostrils  as needed for congestion.   SOMATULINE DEPOT [place] Inject 120 mcg into the skin every 28 (twenty-eight) days. Receives at Dr Antonieta Pert office   vitamin B-12 1000 MCG tablet Commonly known as: CYANOCOBALAMIN Take 1,000 mcg by mouth every evening.        Allergies:  Allergies  Allergen Reactions   Iohexol Hives     Code: HIVES, Desc: PER [name] @ [facility]IMARY [facility]ARE, PT IS ALLERGIC TO CONTRAST DYE 10/02/08/RM  05/01/10...needs full premeds per our protocol w/ [facility]., Onset Date: [id number]     Past Medical History, Surgical history, Social history, and Family History were reviewed and updated.  Review of Systems: Review of Systems  Constitutional: Negative.   HENT: Negative.    Eyes: Negative.   Respiratory: Negative.    Cardiovascular:  Negative.   Gastrointestinal: Negative.   Genitourinary: Negative.   Musculoskeletal: Negative.   Skin: Negative.   Neurological: Negative.   Endo/Heme/Allergies: Negative.   Psychiatric/Behavioral: Negative.      Physical Exam:  weight is 205 lb (93 kg). His oral temperature is 98.4 F (36.9 C). His blood pressure is 134/65 and his pulse is 62. His respiration is 16 and oxygen saturation is 99%.   Wt Readings from Last 3 Encounters:  04/17/21 205 lb (93 kg)  04/02/21 206 lb (93.4 kg)  03/17/21 203 lb 1.3 oz (92.1 kg)    Physical Exam Vitals reviewed.  HENT:     Head: Normocephalic and atraumatic.  Eyes:     Pupils: Pupils are equal, round, and reactive to light.  Cardiovascular:     Rate and Rhythm: Normal rate and regular rhythm.     Heart sounds: Normal heart sounds.  Pulmonary:     Effort: Pulmonary effort is normal.     Breath sounds: Normal breath sounds.  Abdominal:     General: Bowel sounds are normal.     Palpations: Abdomen is soft.  Musculoskeletal:        General: No tenderness or deformity. Normal range of motion.     Cervical back: Normal range of motion.  Lymphadenopathy:     Cervical: No cervical adenopathy.  Skin:    General: Skin is warm and dry.     Findings: No erythema or rash.  Neurological:     Mental Status: He is alert and oriented to person, place, and time.  Psychiatric:        Behavior: Behavior normal.        Thought Content: Thought content normal.        Judgment: Judgment normal.     Lab Results  Component Value Date   WBC 6.6 04/17/2021   HGB 13.3 04/17/2021   HCT 40.2 04/17/2021   MCV 92.8 04/17/2021   PLT 141 (L) 04/17/2021   Lab Results  Component Value Date   FERRITIN 333 03/17/2021   IRON 72 03/17/2021   TIBC 298 03/17/2021   UIBC 225 03/17/2021   IRONPCTSAT 24 03/17/2021   Lab Results  Component Value Date   RBC 4.33 04/17/2021   No results found for: KPAFRELGTCHN, LAMBDASER, KAPLAMBRATIO No results found  for: IGGSERUM, IGA, IGMSERUM No results found for: Odetta Pink, SPEI   Chemistry      Component Value Date/Time   NA 135 04/17/2021 1251   NA 138 09/15/2018 0934   NA 144 10/04/2017 0904   NA 141 07/23/2016 1255   K 4.2 04/17/2021 1251   K 3.8 10/04/2017 0904  K 4.1 07/23/2016 1255   CL 98 04/17/2021 1251   CL 103 10/04/2017 0904   CO2 32 04/17/2021 1251   CO2 28 10/04/2017 0904   CO2 27 07/23/2016 1255   BUN 17 04/17/2021 1251   BUN 14 09/15/2018 0934   BUN 14 10/04/2017 0904   BUN 13.8 07/23/2016 1255   CREATININE 0.95 04/17/2021 1251   CREATININE 0.8 10/04/2017 0904   CREATININE 1.0 07/23/2016 1255      Component Value Date/Time   CALCIUM 9.9 04/17/2021 1251   CALCIUM 9.4 10/04/2017 0904   CALCIUM 9.2 07/23/2016 1255   ALKPHOS 60 04/17/2021 1251   ALKPHOS 64 10/04/2017 0904   ALKPHOS 77 07/23/2016 1255   AST 21 04/17/2021 1251   AST 23 07/23/2016 1255   ALT 15 04/17/2021 1251   ALT 22 10/04/2017 0904   ALT 13 07/23/2016 1255   BILITOT 0.7 04/17/2021 1251   BILITOT 1.13 07/23/2016 1255       Impression and Plan: Mr. Mcmiller is a very pleasant 75 yo caucasian gentleman with metastatic low-grade neuroendocrine carcinoma with hepatic metastasis.  He seems to doing pretty well.  He really is not symptomatic.  I am quite happy about this.  The Somatuline seems to be doing well for him.  His quality of life is doing well.  I will plan to have him come back to see [place] in another month or so.    Volanda Napoleon, MD 6/17/20222:12 PM

## 2021-04-20 LAB — CHROMOGRANIN A: Chromogranin A (ng/mL): 112.5 ng/mL — ABNORMAL HIGH (ref 0.0–101.8)

## 2021-05-02 ENCOUNTER — Other Ambulatory Visit: Payer: Self-pay | Admitting: Cardiovascular Disease

## 2021-05-02 DIAGNOSIS — I10 Essential (primary) hypertension: Secondary | ICD-10-CM

## 2021-05-05 NOTE — Telephone Encounter (Signed)
Rx(s) sent to pharmacy electronically.  

## 2021-05-12 ENCOUNTER — Other Ambulatory Visit: Payer: Self-pay | Admitting: *Deleted

## 2021-05-12 ENCOUNTER — Telehealth: Payer: Self-pay | Admitting: Cardiovascular Disease

## 2021-05-12 DIAGNOSIS — E7800 Pure hypercholesterolemia, unspecified: Secondary | ICD-10-CM

## 2021-05-12 DIAGNOSIS — I10 Essential (primary) hypertension: Secondary | ICD-10-CM

## 2021-05-12 MED ORDER — ATORVASTATIN CALCIUM 20 MG PO TABS: 20.0000 mg | ORAL_TABLET | Freq: Every day | ORAL | 0 refills | Status: DC

## 2021-05-12 MED ORDER — METOPROLOL TARTRATE 25 MG PO TABS: 12.5000 mg | ORAL_TABLET | Freq: Two times a day (BID) | ORAL | 0 refills | Status: DC

## 2021-05-12 NOTE — Telephone Encounter (Signed)
*  STAT* If patient is at the pharmacy, call can be transferred to refill team.   1. Which medications need to be refilled? (please list name of each medication and dose if known)  atorvastatin (LIPITOR) 20 MG tablet metoprolol tartrate (LOPRESSOR) 25 MG tablet  2. Which pharmacy/location (including street and city if local pharmacy) is medication to be sent to? [id number] - [place], [place] - [address]  3. Do they need a 30 day or 90 day supply? 90 day supply

## 2021-05-12 NOTE — Telephone Encounter (Signed)
Rx's have been sent in as requested. Confirmation received.

## 2021-05-20 ENCOUNTER — Inpatient Hospital Stay (HOSPITAL_BASED_OUTPATIENT_CLINIC_OR_DEPARTMENT_OTHER): Payer: Medicare HMO | Admitting: Hematology & Oncology

## 2021-05-20 ENCOUNTER — Encounter: Payer: Self-pay | Admitting: Hematology & Oncology

## 2021-05-20 ENCOUNTER — Other Ambulatory Visit: Payer: Self-pay

## 2021-05-20 ENCOUNTER — Inpatient Hospital Stay: Payer: Medicare HMO | Attending: Hematology & Oncology

## 2021-05-20 ENCOUNTER — Inpatient Hospital Stay: Payer: Medicare HMO

## 2021-05-20 ENCOUNTER — Other Ambulatory Visit: Payer: Self-pay | Admitting: Cardiovascular Disease

## 2021-05-20 VITALS — BP 153/63 | HR 66 | Temp 98.2°F | Resp 18 | Wt 205.0 lb

## 2021-05-20 DIAGNOSIS — C7A8 Other malignant neuroendocrine tumors: Secondary | ICD-10-CM | POA: Diagnosis not present

## 2021-05-20 DIAGNOSIS — C7B8 Other secondary neuroendocrine tumors: Secondary | ICD-10-CM

## 2021-05-20 DIAGNOSIS — E7800 Pure hypercholesterolemia, unspecified: Secondary | ICD-10-CM

## 2021-05-20 DIAGNOSIS — D500 Iron deficiency anemia secondary to blood loss (chronic): Secondary | ICD-10-CM

## 2021-05-20 DIAGNOSIS — D509 Iron deficiency anemia, unspecified: Secondary | ICD-10-CM | POA: Insufficient documentation

## 2021-05-20 DIAGNOSIS — I10 Essential (primary) hypertension: Secondary | ICD-10-CM

## 2021-05-20 LAB — CBC WITH DIFFERENTIAL (CANCER CENTER ONLY)
Abs Immature Granulocytes: 0.04 10*3/uL (ref 0.00–0.07)
Basophils Absolute: 0 10*3/uL (ref 0.0–0.1)
Basophils Relative: 1 %
Eosinophils Absolute: 0.4 10*3/uL (ref 0.0–0.5)
Eosinophils Relative: 6 %
HCT: 39.7 % (ref 39.0–52.0)
Hemoglobin: 13.1 g/dL (ref 13.0–17.0)
Immature Granulocytes: 1 %
Lymphocytes Relative: 17 %
Lymphs Abs: 1.1 10*3/uL (ref 0.7–4.0)
MCH: 31 pg (ref 26.0–34.0)
MCHC: 33 g/dL (ref 30.0–36.0)
MCV: 94.1 fL (ref 80.0–100.0)
Monocytes Absolute: 0.4 10*3/uL (ref 0.1–1.0)
Monocytes Relative: 6 %
Neutro Abs: 4.5 10*3/uL (ref 1.7–7.7)
Neutrophils Relative %: 69 %
Platelet Count: 151 10*3/uL (ref 150–400)
RBC: 4.22 MIL/uL (ref 4.22–5.81)
RDW: 13.2 % (ref 11.5–15.5)
WBC Count: 6.5 10*3/uL (ref 4.0–10.5)
nRBC: 0 % (ref 0.0–0.2)

## 2021-05-20 LAB — CMP (CANCER CENTER ONLY)
ALT: 15 U/L (ref 0–44)
AST: 22 U/L (ref 15–41)
Albumin: 4 g/dL (ref 3.5–5.0)
Alkaline Phosphatase: 53 U/L (ref 38–126)
Anion gap: 6 (ref 5–15)
BUN: 21 mg/dL (ref 8–23)
CO2: 31 mmol/L (ref 22–32)
Calcium: 9.8 mg/dL (ref 8.9–10.3)
Chloride: 101 mmol/L (ref 98–111)
Creatinine: 1.05 mg/dL (ref 0.61–1.24)
GFR, Estimated: >60 mL/min (ref >60)
Glucose, Bld: 125 mg/dL — ABNORMAL HIGH (ref 70–99)
Potassium: 4.2 mmol/L (ref 3.5–5.1)
Sodium: 138 mmol/L (ref 135–145)
Total Bilirubin: 0.9 mg/dL (ref 0.3–1.2)
Total Protein: 7.3 g/dL (ref 6.5–8.1)

## 2021-05-20 LAB — LACTATE DEHYDROGENASE: LDH: 170 U/L (ref 98–192)

## 2021-05-20 LAB — IRON AND TIBC
Iron: 73 ug/dL (ref 45–182)
Saturation Ratios: 24 % (ref 17.9–39.5)
TIBC: 309 ug/dL (ref 250–450)
UIBC: 236 ug/dL

## 2021-05-20 LAB — FERRITIN: Ferritin: 253 ng/mL (ref 24–336)

## 2021-05-20 MED ORDER — LANREOTIDE ACETATE 120 MG/0.5ML ~~LOC~~ SOLN
120.0000 mg | Freq: Once | SUBCUTANEOUS | Status: AC
  Administered 2021-05-20: 120 mg via SUBCUTANEOUS

## 2021-05-20 NOTE — Progress Notes (Signed)
Hematology and Oncology Follow Up Visit  ARUN HERROD [id number] 10-22-1946 75 y.o. 05/20/2021   Principle Diagnosis:  Metastatic low grade neuroendocrine tumor-hepatic metastases Iron deficiency anemia   Past Therapy: S/p yttrium-90 intrahepatic therapy - November 2016 Lutathera (Lu 177) injection on 02/01/2018 - s/p cycle 4   Current Therapy:        Somatuline 120 mg monthly IV Iron as indicated    Interim History:  Mr. Hollars is here today for follow-up.  He is doing pretty well.  He is managing nicely.  As always, we talked about the [place].  His wife is from [place] so he is actually played the courses out there.  He is feeling okay.  His last Chromogranin A level was down to 112.  He says he has occasional loose stool.  He has had no cardiac issues.  He had no chest pain.  There has been no shortness of breath.  His last iron studies looked okay.  The ferritin was 240 with an iron saturation of 18%.    There is been no leg swelling.  He has had no rashes.  Overall, his performance status is ECOG 1.    Medications:  Allergies as of 05/20/2021       Reactions   Iohexol Hives    Code: HIVES, Desc: PER [name] @ [facility]IMARY [facility]ARE, PT IS ALLERGIC TO CONTRAST DYE 10/02/08/RM  05/01/10...needs full premeds per our protocol w/ [facility]., Onset Date: [id number]        Medication List        Accurate as of May 20, 2021  3:22 PM. If you have any questions, ask your nurse or doctor.          aspirin EC 81 MG tablet Take 81 mg by mouth every morning.   atorvastatin 20 MG tablet Commonly known as: LIPITOR Take 1 tablet (20 mg total) by mouth daily at 6 PM.   Cholecalciferol 25 MCG (1000 UT) tablet Take 1,000 Units by mouth daily.   clopidogrel 75 MG tablet Commonly known as: PLAVIX TAKE 1 TABLET BY MOUTH EVERY DAY   fluticasone 50 MCG/ACT nasal spray Commonly known as: FLONASE Place 2 sprays into both nostrils daily as needed (sinuses).    hydrochlorothiazide 25 MG tablet Commonly known as: HYDRODIURIL TAKE 1/2 TABLET BY MOUTH EVERY DAY   hydrocortisone 25 MG suppository Commonly known as: ANUSOL-HC INSERT 1 SUPPOSITORY RECTALLY TWICE A DAY AS NEEDED FOR HEMORRHOIDS   ipratropium 0.03 % nasal spray Commonly known as: Atrovent Place 2 sprays into the nose 3 (three) times daily.   lansoprazole 30 MG capsule Commonly known as: PREVACID Take 1 capsule (30 mg total) by mouth daily at 12 noon.   levothyroxine 50 MCG tablet Commonly known as: SYNTHROID Take 1 tablet (50 mcg total) by mouth daily before breakfast.   losartan 100 MG tablet Commonly known as: COZAAR TAKE 1 TABLET BY MOUTH EVERY DAY   metoprolol tartrate 25 MG tablet Commonly known as: LOPRESSOR Take 0.5 tablets (12.5 mg total) by mouth 2 (two) times daily.   nitroGLYCERIN 0.4 MG SL tablet Commonly known as: NITROSTAT PLACE 1 TABLET UNDER THE TONGUE EVERY 5 (FIVE) MINUTES X 3 DOSES AS NEEDED FOR CHEST PAIN.   sodium chloride 0.65 % Soln nasal spray Commonly known as: OCEAN Place 1 spray into both nostrils as needed for congestion.   SOMATULINE DEPOT [place] Inject 120 mcg into the skin every 28 (twenty-eight) days. Receives at Dr Antonieta Pert office  vitamin B-12 1000 MCG tablet Commonly known as: CYANOCOBALAMIN Take 1,000 mcg by mouth every evening.        Allergies:  Allergies  Allergen Reactions   Iohexol Hives     Code: HIVES, Desc: PER [name] @ [facility]IMARY [facility]ARE, PT IS ALLERGIC TO CONTRAST DYE 10/02/08/RM  05/01/10...needs full premeds per our protocol w/ [facility]., Onset Date: [id number]     Past Medical History, Surgical history, Social history, and Family History were reviewed and updated.  Review of Systems: Review of Systems  Constitutional: Negative.   HENT: Negative.    Eyes: Negative.   Respiratory: Negative.    Cardiovascular: Negative.   Gastrointestinal: Negative.   Genitourinary: Negative.   Musculoskeletal: Negative.   Skin:  Negative.   Neurological: Negative.   Endo/Heme/Allergies: Negative.   Psychiatric/Behavioral: Negative.      Physical Exam:  weight is 205 lb (93 kg). His oral temperature is 98.2 F (36.8 C). His blood pressure is 153/63 (abnormal) and his pulse is 66. His respiration is 18 and oxygen saturation is 100%.   Wt Readings from Last 3 Encounters:  05/20/21 205 lb (93 kg)  04/17/21 205 lb (93 kg)  04/02/21 206 lb (93.4 kg)    Physical Exam Vitals reviewed.  HENT:     Head: Normocephalic and atraumatic.  Eyes:     Pupils: Pupils are equal, round, and reactive to light.  Cardiovascular:     Rate and Rhythm: Normal rate and regular rhythm.     Heart sounds: Normal heart sounds.  Pulmonary:     Effort: Pulmonary effort is normal.     Breath sounds: Normal breath sounds.  Abdominal:     General: Bowel sounds are normal.     Palpations: Abdomen is soft.  Musculoskeletal:        General: No tenderness or deformity. Normal range of motion.     Cervical back: Normal range of motion.  Lymphadenopathy:     Cervical: No cervical adenopathy.  Skin:    General: Skin is warm and dry.     Findings: No erythema or rash.  Neurological:     Mental Status: He is alert and oriented to person, place, and time.  Psychiatric:        Behavior: Behavior normal.        Thought Content: Thought content normal.        Judgment: Judgment normal.     Lab Results  Component Value Date   WBC 6.5 05/20/2021   HGB 13.1 05/20/2021   HCT 39.7 05/20/2021   MCV 94.1 05/20/2021   PLT 151 05/20/2021   Lab Results  Component Value Date   FERRITIN 240 04/17/2021   IRON 57 04/17/2021   TIBC 325 04/17/2021   UIBC 268 04/17/2021   IRONPCTSAT 18 04/17/2021   Lab Results  Component Value Date   RBC 4.22 05/20/2021   No results found for: KPAFRELGTCHN, LAMBDASER, KAPLAMBRATIO No results found for: IGGSERUM, IGA, IGMSERUM No results found for: Odetta Pink, SPEI   Chemistry      Component Value Date/Time   NA 138 05/20/2021 1321   NA 138 09/15/2018 0934   NA 144 10/04/2017 0904   NA 141 07/23/2016 1255   K 4.2 05/20/2021 1321   K 3.8 10/04/2017 0904   K 4.1 07/23/2016 1255   CL 101 05/20/2021 1321   CL 103 10/04/2017 0904   CO2 31 05/20/2021 1321   CO2 28 10/04/2017 0904  CO2 27 07/23/2016 1255   BUN 21 05/20/2021 1321   BUN 14 09/15/2018 0934   BUN 14 10/04/2017 0904   BUN 13.8 07/23/2016 1255   CREATININE 1.05 05/20/2021 1321   CREATININE 0.8 10/04/2017 0904   CREATININE 1.0 07/23/2016 1255      Component Value Date/Time   CALCIUM 9.8 05/20/2021 1321   CALCIUM 9.4 10/04/2017 0904   CALCIUM 9.2 07/23/2016 1255   ALKPHOS 53 05/20/2021 1321   ALKPHOS 64 10/04/2017 0904   ALKPHOS 77 07/23/2016 1255   AST 22 05/20/2021 1321   AST 23 07/23/2016 1255   ALT 15 05/20/2021 1321   ALT 22 10/04/2017 0904   ALT 13 07/23/2016 1255   BILITOT 0.9 05/20/2021 1321   BILITOT 1.13 07/23/2016 1255       Impression and Plan: Mr. Schweiss is a very pleasant 75 yo caucasian gentleman with metastatic low-grade neuroendocrine carcinoma with hepatic metastasis.  He seems to doing pretty well.  He really is not symptomatic.  I am quite happy about this.  The Somatuline seems to be doing well for him.  I am glad that the Chromogranin A level is doing so nicely.  His quality of life is doing well.  I will plan to have him come back to see [place] in another month or so.    Volanda Napoleon, MD 7/20/20223:22 PM

## 2021-05-20 NOTE — Patient Instructions (Signed)
Lanreotide injection What is this medication? LANREOTIDE (lan REE oh tide) is used to reduce blood levels of growth hormone in patients with a condition called acromegaly. It also works to slow or stop tumor growth in patients with neuroendocrine tumors and treat carcinoid syndrome. This medicine may be used for other purposes; ask your health care provider or pharmacist if you have questions. COMMON BRAND NAME(S): Somatuline [place] What should I tell my care team before I take this medication? They need to know if you have any of these conditions: diabetes gallbladder disease heart disease kidney disease liver disease thyroid disease an unusual or allergic reaction to lanreotide, other medicines, foods, dyes, or preservatives pregnant or trying to get pregnant breast-feeding How should I use this medication? This medicine is for injection under the skin. It is given by a health care professional in a hospital or clinic setting. Contact your pediatrician or health care professional regarding the use of this medicine in children. Special care may be needed. Overdosage: If you think you have taken too much of this medicine contact a poison control center or emergency room at once. NOTE: This medicine is only for you. Do not share this medicine with others. What if I miss a dose? It is important not to miss your dose. Call your doctor or health care professional if you are unable to keep an appointment. What may interact with this medication? This medicine may interact with the following medications: bromocriptine cyclosporine certain medicines for blood pressure, heart disease, irregular heart beat certain medicines for diabetes quinidine terfenadine This list may not describe all possible interactions. Give your health care provider a list of all the medicines, herbs, non-prescription drugs, or dietary supplements you use. Also tell them if you smoke, drink alcohol, or use illegal drugs.  Some items may interact with your medicine. What should I watch for while using this medication? Tell your doctor or healthcare professional if your symptoms do not start to get better or if they get worse. Visit your doctor or health care professional for regular checks on your progress. Your condition will be monitored carefully while you are receiving this medicine. This medicine may increase blood sugar. Ask your healthcare provider if changes in diet or medicines are needed if you have diabetes. You may need blood work done while you are taking this medicine. Women should inform their doctor if they wish to become pregnant or think they might be pregnant. There is a potential for serious side effects to an unborn child. Talk to your health care professional or pharmacist for more information. Do not breast-feed an infant while taking this medicine or for 6 months after stopping it. This medicine has caused ovarian failure in some women. This medicine may interfere with the ability to have a child. Talk with your doctor or health care professional if you are concerned about your fertility. What side effects may I notice from receiving this medication? Side effects that you should report to your doctor or health care professional as soon as possible: allergic reactions like skin rash, itching or hives, swelling of the face, lips, or tongue increased blood pressure severe stomach pain signs and symptoms of hgh blood sugar such as being more thirsty or hungry or having to urinate more than normal. You may also feel very tired or have blurry vision. signs and symptoms of low blood sugar such as feeling anxious; confusion; dizziness; increased hunger; unusually weak or tired; sweating; shakiness; cold; irritable; headache; blurred vision; fast   heartbeat; loss of consciousness unusually slow heartbeat Side effects that usually do not require medical attention (report to your doctor or health care  professional if they continue or are bothersome): constipation diarrhea dizziness headache muscle pain muscle spasms nausea pain, redness, or irritation at site where injected This list may not describe all possible side effects. Call your doctor for medical advice about side effects. You may report side effects to FDA at 1-800-FDA-1088. Where should I keep my medication? This drug is given in a hospital or clinic and will not be stored at home. NOTE: This sheet is a summary. It may not cover all possible information. If you have questions about this medicine, talk to your doctor, pharmacist, or health care provider.  2022 Elsevier/Gold Standard (2018-07-27 09:13:08)  

## 2021-05-21 ENCOUNTER — Telehealth: Payer: Self-pay | Admitting: Hematology & Oncology

## 2021-05-21 LAB — CHROMOGRANIN A: Chromogranin A (ng/mL): 98.6 ng/mL (ref 0.0–101.8)

## 2021-05-21 NOTE — Telephone Encounter (Signed)
Appointments scheduled per 7/20 los patient has My Chart for updates

## 2021-06-15 NOTE — Progress Notes (Signed)
[place] [facility] [place], [place], [place] [address] [id number] [phone]  Date:  06/17/2021   Name:  Brian Mays   DOB:  September 18, 1946   MRN:  [id number]  PCP:  Darreld Mclean, MD    Chief Complaint: Joint Swelling (Right knee pain, swelling/) and muscle strain (Working in yard, pulled back muscle)   History of Present Illness:  Brian Mays is a 75 y.o. very pleasant male patient who presents with the following:  Patient seen today with a knee problem - history of metastatic malignant neuroendocrine tumor in the liver as well as iron absorption, CAD status post stenting in 2017, hypothyroidism, hypertension, chronic pancreatitis Most recent visit with myself was in June with a right quadricep injury that due to taking a 5 mile hike  Pt notes that this is an extension of the first injury as above He has a vague discomfort in his knee His knee will occasionally hurt- esp if he fully flexes the knee It will swell intermittently but much better than it was  The knee does not click or pop in particular, no instability   He is wearing a compression sleeve- he thinks this helps him some He is also using ice which seems to help some   He also pulled his back playing golf last weekend -he notes pain over his right lower back He also notes pain over the left greater trochanter for a couple of weeks; this will seem to come and go, no injury that he can recall  Patient Active Problem List   Diagnosis Date Noted   Near syncope 04/22/2020   Iron deficiency anemia due to chronic blood loss 03/29/2019   Iron malabsorption 03/29/2019   Lumbar radiculopathy 08/16/2017   CAD (coronary artery disease) 09/21/2016   Unstable angina ([place]) 02/09/2016   Vitamin D deficiency 03/11/2015   Metastatic malignant neuroendocrine tumor to liver ([place]) 02/14/2015   Hyperglycemia 04/03/2012   Diarrhea 03/29/2012   B12 deficiency 04/19/2011    TOBACCO USE, QUIT 10/13/2009   Unspecified vitamin D deficiency 06/19/2009   Neoplasm by body site 12/23/2008   DIVERTICULOSIS, COLON 11/08/2008   ABDOMINAL PAIN 10/02/2008   Hypothyroidism 12/13/2007   HEMORRHOIDS, NOS 12/13/2007   COLONIC POLYPS, HX OF 12/13/2007   Essential hypertension 09/15/2007   GERD 09/15/2007   PANCREATITIS, CHRONIC 09/15/2007   CARDIAC MURMUR 09/15/2007   SNORING 09/15/2007    Past Medical History:  Diagnosis Date   Anemia    in past   Colon polyps    Diverticulosis    Gallstones    GERD (gastroesophageal reflux disease)    Heart attack ([place])    mild, Spring 2017   Heart murmur    Hemorrhoid    Hiatal hernia    HTN (hypertension)    Hypothyroidism    Iron deficiency anemia due to chronic blood loss 03/29/2019   Iron malabsorption 03/29/2019   Metastatic carcinoma ([place]) 2010   Dr Jonette Eva   Pancreatitis 1998   chronic    Past Surgical History:  Procedure Laterality Date   [place] N/A 02/10/2016   Procedure: Left Heart Cath and Coronary Angiography;  Surgeon: Adrian Prows, MD;  Location: [facility];  Service: Cardiovascular;  Laterality: N/A;   CARDIAC CATHETERIZATION  02/10/2016   Procedure: Coronary/Graft Atherectomy;  Surgeon: Adrian Prows, MD;  Location: [facility];  Service: Cardiovascular;;   CARDIAC  CATHETERIZATION  02/10/2016   Procedure: Coronary Stent Intervention;  Surgeon: Adrian Prows, MD;  Location: [facility];  Service: Cardiovascular;;   CATARACT EXTRACTION W/ INTRAOCULAR LENS  IMPLANT, BILATERAL Bilateral    IR GENERIC HISTORICAL  12/30/2015   IR RADIOLOGIST EVAL & MGMT 12/30/2015 Aletta Edouard, MD [facility] INTERV RAD   IR GENERIC HISTORICAL  11/09/2016   IR RADIOLOGIST EVAL & MGMT 11/09/2016 Aletta Edouard, MD [facility] INTERV RAD   IR RADIOLOGIST EVAL & MGMT  03/01/2017   IR RADIOLOGIST EVAL & MGMT  06/29/2017   LAPAROSCOPIC CHOLECYSTECTOMY  1999   LIVER BIOPSY  2010   RADIOACTIVE SEED  IMPLANT  X 3   "to my liver"   TUMOR EXCISION  01/2009   Carcinoil Resection    TUMOR REMOVAL     from small intestine    Social History   Tobacco Use   Smoking status: Former    Packs/day: 1.50    Years: 35.00    Pack years: 52.50    Types: Cigarettes    Start date: 09/25/1959    Quit date: 12/21/1997    Years since quitting: 23.5   Smokeless tobacco: Never  Vaping Use   Vaping Use: Never used  Substance Use Topics   Alcohol use: Yes    Alcohol/week: 3.0 - 4.0 standard drinks    Types: 3 - 4 Cans of beer per week    Comment: 4 beers weekly    Drug use: No    Family History  Problem Relation Age of Onset   Kidney disease Mother    Hyperlipidemia Mother    Hypertension Mother    COPD Father    Ulcerative colitis Daughter     Allergies  Allergen Reactions   Iohexol Hives     Code: HIVES, Desc: PER [name] @ PRIMARY CARE, PT IS ALLERGIC TO CONTRAST DYE 10/02/08/RM  05/01/10...needs full premeds per our protocol w/ [facility]., Onset Date: [id number]     Medication list has been reviewed and updated.  Current Outpatient Medications on File Prior to Visit  Medication Sig Dispense Refill   aspirin EC 81 MG tablet Take 81 mg by mouth every morning.     atorvastatin (LIPITOR) 20 MG tablet Take 1 tablet (20 mg total) by mouth daily at 6 PM. 90 tablet 0   Cholecalciferol 1000 UNITS tablet Take 1,000 Units by mouth daily.     clopidogrel (PLAVIX) 75 MG tablet TAKE 1 TABLET BY MOUTH EVERY DAY 90 tablet 1   fluticasone (FLONASE) 50 MCG/ACT nasal spray Place 2 sprays into both nostrils daily as needed (sinuses).   11   hydrochlorothiazide (HYDRODIURIL) 25 MG tablet TAKE 1/2 TABLET BY MOUTH EVERY DAY 45 tablet 3   hydrocortisone (ANUSOL-HC) 25 MG suppository INSERT 1 SUPPOSITORY RECTALLY TWICE A DAY AS NEEDED FOR HEMORRHOIDS 30 suppository 0   ipratropium (ATROVENT) 0.03 % nasal spray Place 2 sprays into the nose 3 (three) times daily. 30 mL 6   Lanreotide Acetate (SOMATULINE DEPOT  [place]) Inject 120 mcg into the skin every 28 (twenty-eight) days. Receives at Dr Antonieta Pert office     lansoprazole (PREVACID) 30 MG capsule Take 1 capsule (30 mg total) by mouth daily at 12 noon. 90 capsule 3   levothyroxine (SYNTHROID) 50 MCG tablet Take 1 tablet (50 mcg total) by mouth daily before breakfast. 90 tablet 3   losartan (COZAAR) 100 MG tablet TAKE 1 TABLET BY MOUTH EVERY DAY 90 tablet 1   metoprolol tartrate (LOPRESSOR) 25  MG tablet Take 0.5 tablets (12.5 mg total) by mouth 2 (two) times daily. 90 tablet 0   nitroGLYCERIN (NITROSTAT) 0.4 MG SL tablet PLACE 1 TABLET UNDER THE TONGUE EVERY 5 (FIVE) MINUTES X 3 DOSES AS NEEDED FOR CHEST PAIN. 25 tablet 8   sodium chloride (OCEAN) 0.65 % SOLN nasal spray Place 1 spray into both nostrils as needed for congestion.      vitamin B-12 (CYANOCOBALAMIN) 1000 MCG tablet Take 1,000 mcg by mouth every evening.      No current facility-administered medications on file prior to visit.    Review of Systems:  As per HPI- otherwise negative.   Physical Examination: Vitals:   06/17/21 1428  BP: 122/80  Pulse: 85  Resp: 15  Temp: (!) 97.5 F (36.4 C)  SpO2: 98%   Vitals:   06/17/21 1428  Weight: 206 lb (93.4 kg)  Height: 5' 10.5" (1.791 m)   Body mass index is 29.14 kg/m. Ideal Body Weight: Weight in (lb) to have BMI = 25: 176.4  GEN: no acute distress.  Overweight, looks well HEENT: Atraumatic, Normocephalic.  Ears and Nose: No external deformity. CV: RRR, No M/G/R. No JVD. No thrill. No extra heart sounds. PULM: CTA B, no wheezes, crackles, rhonchi. No retractions. No resp. distress. No accessory muscle use. ABD: S, NT, ND EXTR: No c/c/e PSYCH: Normally interactive. Conversant.  Right knee: Small effusion, there is mild medial joint line tenderness.  Mild crepitus with flexion extension.  Knee is stable, no heat or redness I am not able to reproduce left lateral hip pain by palpating over the greater trochanter.  Normal range of  motion of the left hip-no pain with range of motion He has minor spasm and tenderness of the right lumbar paraspinous muscles Normal strength of both lower extremities, negative straight leg raise Assessment and Plan: Acute pain of right knee - Plan: Ambulatory referral to Orthopedic Surgery, DG Knee Complete 4 Views Right  Repetitive strain injury of lower back, initial encounter  Left hip pain Patient following up today for continued right knee pain following a quad injury which occurred in June.  At this point we will obtain plain films of his knee and referred to orthopedics.  I suspect he may benefit from a joint injection History suspicious for trochanteric bursitis on the left, but I cannot reproduce tenderness.  As such we will monitor. Recommended heat, gentle stretching for lower back strain This visit occurred during the SARS-CoV-2 public health emergency.  Safety protocols were in place, including screening questions prior to the visit, additional usage of staff PPE, and extensive cleaning of exam room while observing appropriate contact time as indicated for disinfecting solutions.   Signed Lamar Blinks, MD

## 2021-06-17 ENCOUNTER — Ambulatory Visit (HOSPITAL_BASED_OUTPATIENT_CLINIC_OR_DEPARTMENT_OTHER)
Admission: RE | Admit: 2021-06-17 | Discharge: 2021-06-17 | Disposition: A | Discharge status: Home / Self Care | Payer: Medicare HMO | Source: Ambulatory Visit | Attending: Family Medicine | Admitting: Family Medicine

## 2021-06-17 ENCOUNTER — Other Ambulatory Visit: Payer: Self-pay

## 2021-06-17 ENCOUNTER — Ambulatory Visit (INDEPENDENT_AMBULATORY_CARE_PROVIDER_SITE_OTHER): Payer: Medicare HMO | Admitting: Family Medicine

## 2021-06-17 VITALS — BP 122/80 | HR 85 | Temp 97.5°F | Resp 15 | Ht 70.5 in | Wt 206.0 lb

## 2021-06-17 DIAGNOSIS — S39012A Strain of muscle, fascia and tendon of lower back, initial encounter: Secondary | ICD-10-CM | POA: Diagnosis not present

## 2021-06-17 DIAGNOSIS — M25561 Pain in right knee: Secondary | ICD-10-CM | POA: Diagnosis not present

## 2021-06-17 DIAGNOSIS — M25552 Pain in left hip: Secondary | ICD-10-CM | POA: Diagnosis not present

## 2021-06-17 DIAGNOSIS — M1711 Unilateral primary osteoarthritis, right knee: Secondary | ICD-10-CM | POA: Diagnosis not present

## 2021-06-17 DIAGNOSIS — X503XXA Overexertion from repetitive movements, initial encounter: Secondary | ICD-10-CM | POA: Diagnosis not present

## 2021-06-17 IMAGING — DX DG KNEE COMPLETE 4+V*R*
4 series · 4 of 4 positions shown · non-contrast
Comparison: None.

CLINICAL DATA: Right-sided knee pain

EXAM:
RIGHT KNEE - COMPLETE 4+ VIEW

[knee ap]
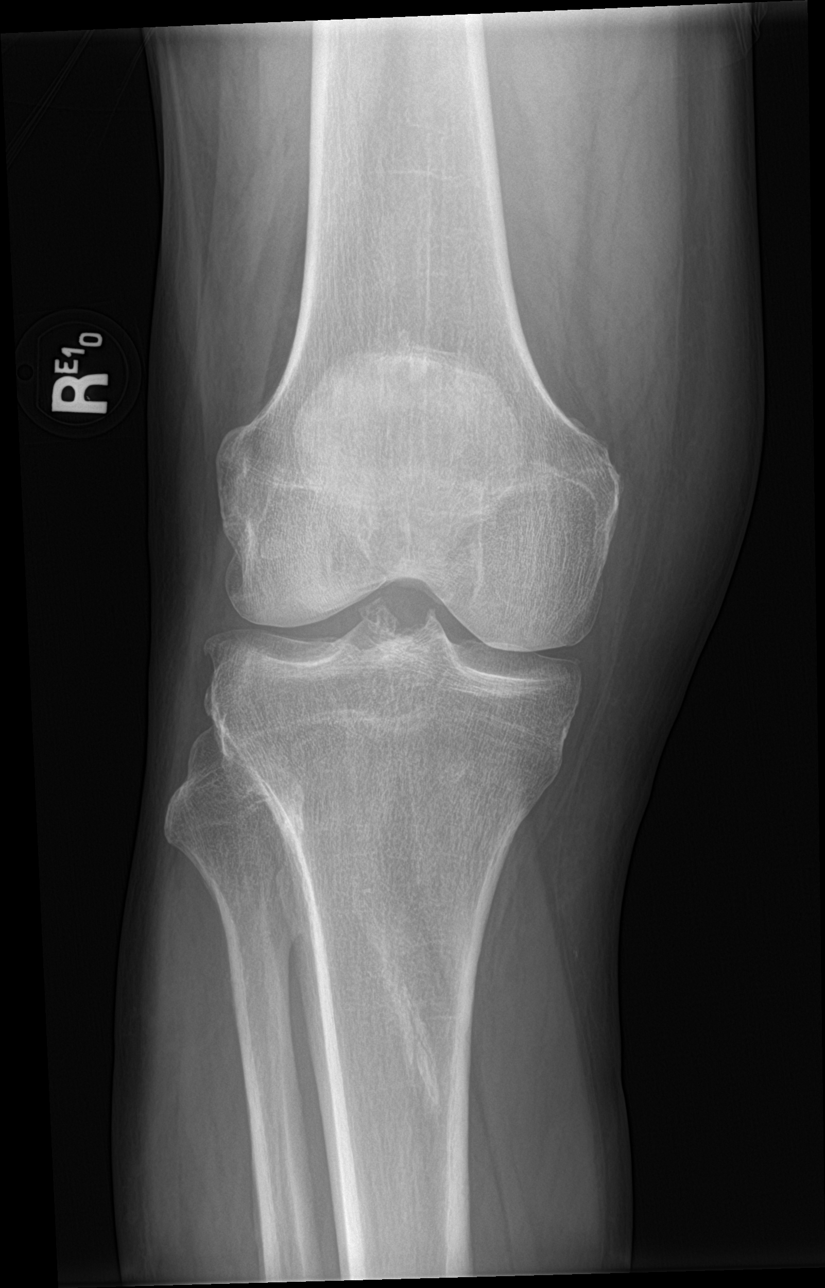

[knee obl (1 of 2)]
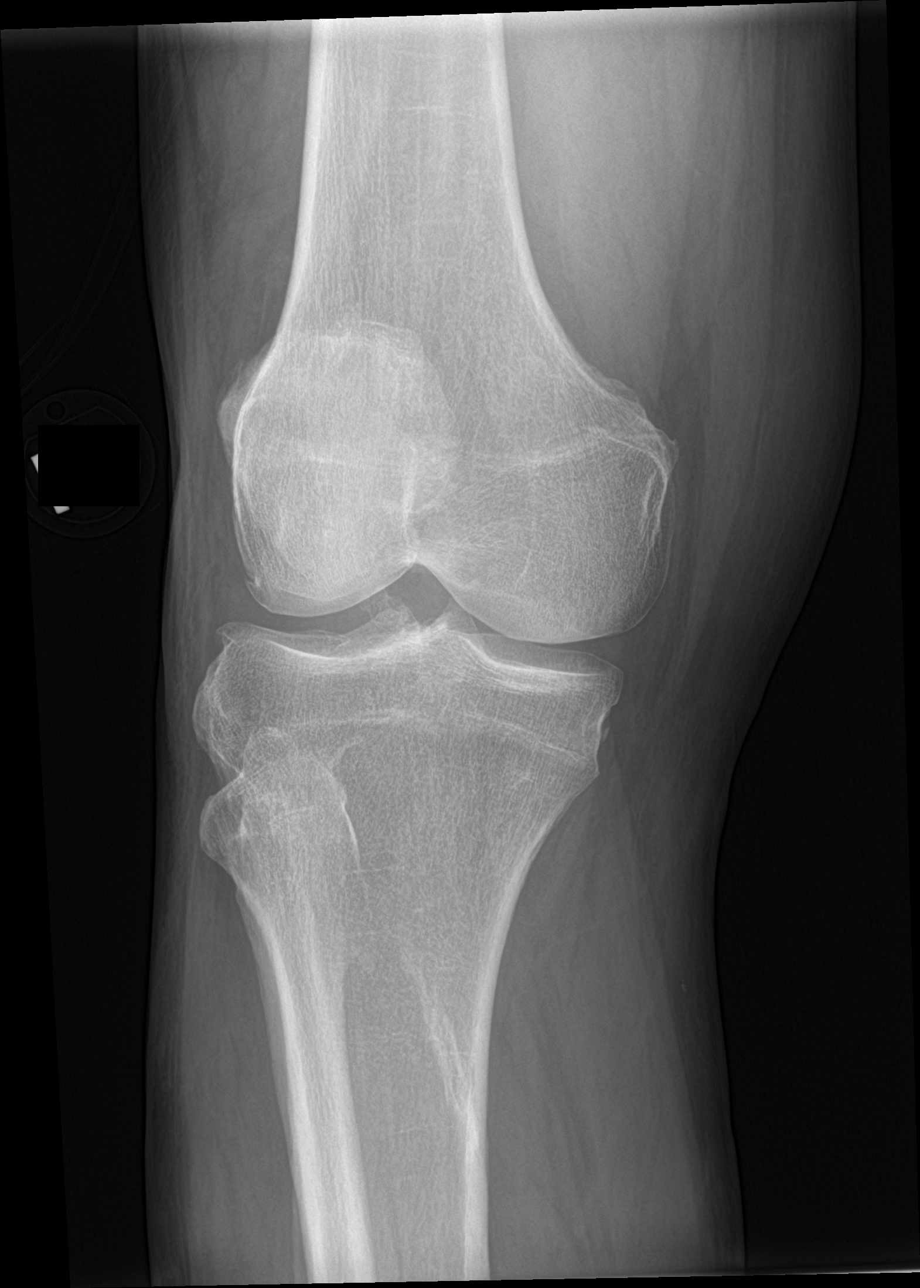

[knee obl (2 of 2)]
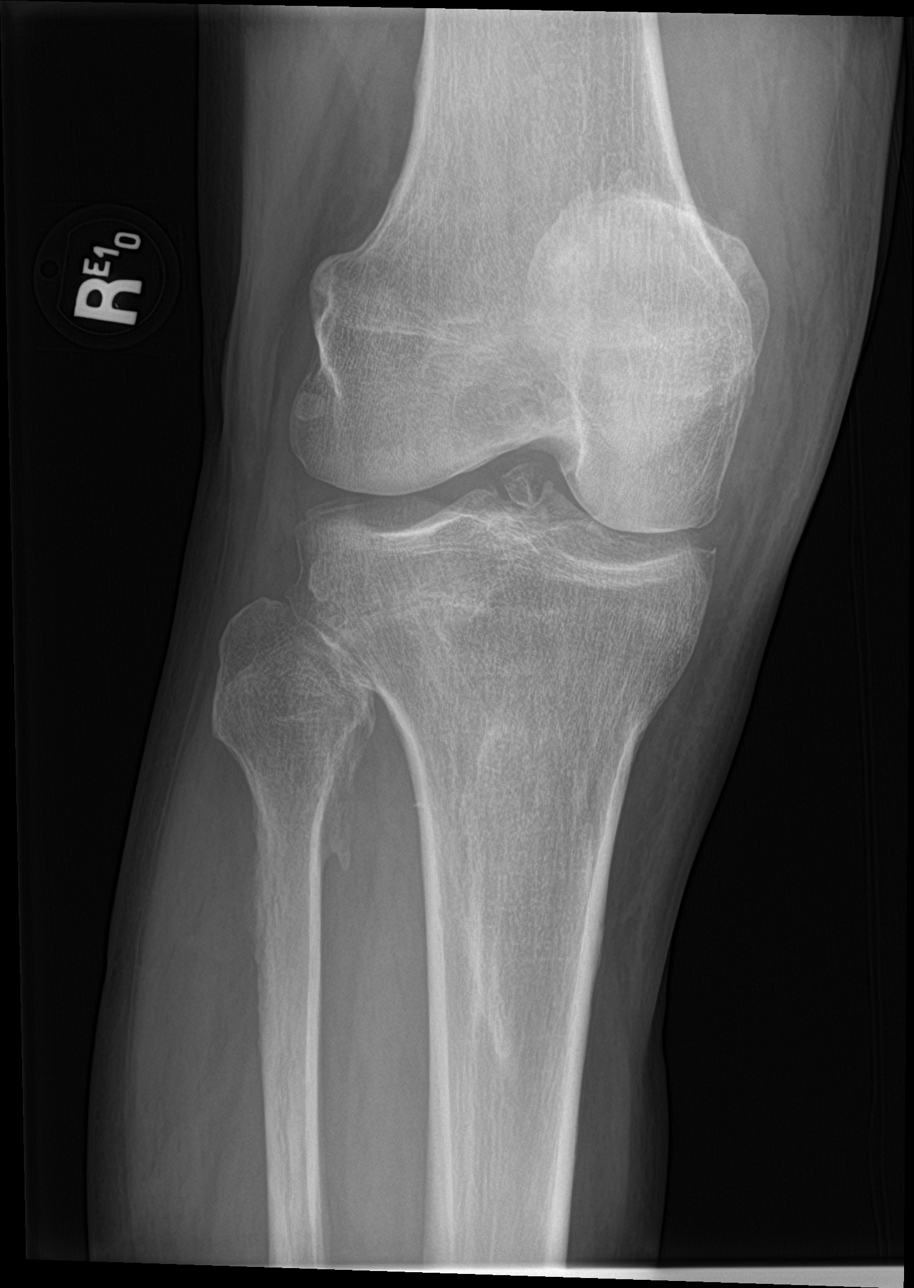

[knee lat]
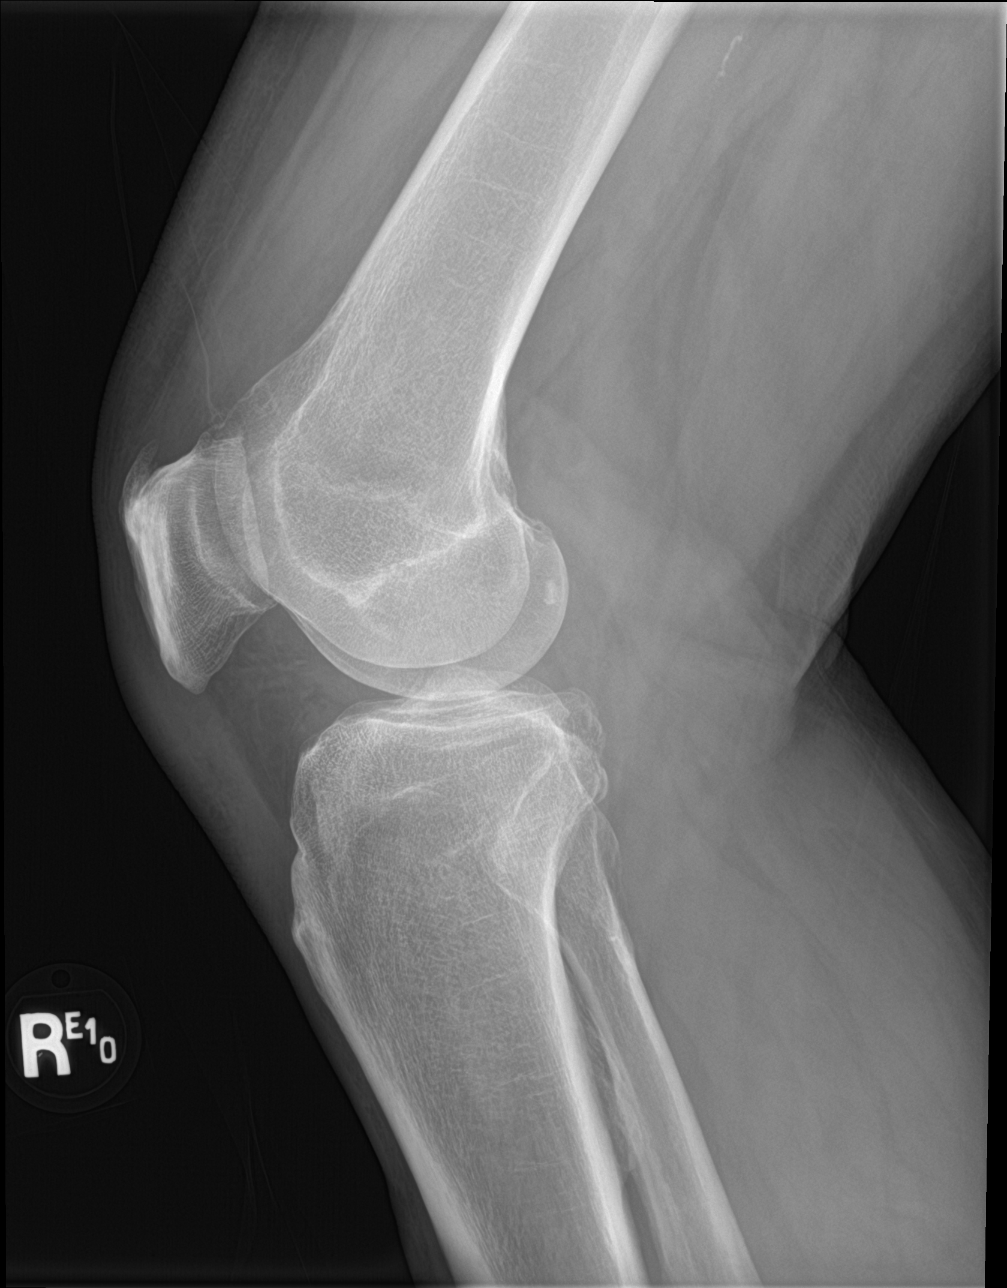

[4 of 4 positions shown; findings below may reference images not displayed]

FINDINGS: No fracture or malalignment. Mild tricompartment arthritis. Possible
small knee effusion
IMPRESSION: Mild arthritis with possible effusion

## 2021-06-17 NOTE — Patient Instructions (Signed)
It was good to see you again today- I will set you up to see Dr Erlinda Hong to look at your knee in further detail. Go ahead and get a set of right knee x-rays in the meantime - can be done at the imaging dept on the ground floor

## 2021-06-18 ENCOUNTER — Telehealth: Payer: Self-pay

## 2021-06-18 ENCOUNTER — Inpatient Hospital Stay (HOSPITAL_BASED_OUTPATIENT_CLINIC_OR_DEPARTMENT_OTHER): Payer: Medicare HMO | Admitting: Hematology & Oncology

## 2021-06-18 ENCOUNTER — Encounter: Payer: Self-pay | Admitting: Hematology & Oncology

## 2021-06-18 ENCOUNTER — Inpatient Hospital Stay: Payer: Medicare HMO

## 2021-06-18 ENCOUNTER — Inpatient Hospital Stay: Payer: Medicare HMO | Attending: Hematology & Oncology

## 2021-06-18 VITALS — BP 134/65 | HR 75 | Temp 98.8°F | Resp 16 | Wt 206.0 lb

## 2021-06-18 DIAGNOSIS — D500 Iron deficiency anemia secondary to blood loss (chronic): Secondary | ICD-10-CM

## 2021-06-18 DIAGNOSIS — D509 Iron deficiency anemia, unspecified: Secondary | ICD-10-CM | POA: Insufficient documentation

## 2021-06-18 DIAGNOSIS — C7B8 Other secondary neuroendocrine tumors: Secondary | ICD-10-CM | POA: Diagnosis not present

## 2021-06-18 DIAGNOSIS — C7A8 Other malignant neuroendocrine tumors: Secondary | ICD-10-CM | POA: Insufficient documentation

## 2021-06-18 LAB — LACTATE DEHYDROGENASE: LDH: 163 U/L (ref 98–192)

## 2021-06-18 LAB — CBC WITH DIFFERENTIAL (CANCER CENTER ONLY)
Abs Immature Granulocytes: 0.09 10*3/uL — ABNORMAL HIGH (ref 0.00–0.07)
Basophils Absolute: 0.1 10*3/uL (ref 0.0–0.1)
Basophils Relative: 1 %
Eosinophils Absolute: 0.4 10*3/uL (ref 0.0–0.5)
Eosinophils Relative: 5 %
HCT: 40.9 % (ref 39.0–52.0)
Hemoglobin: 13.5 g/dL (ref 13.0–17.0)
Immature Granulocytes: 1 %
Lymphocytes Relative: 16 %
Lymphs Abs: 1.3 10*3/uL (ref 0.7–4.0)
MCH: 30.8 pg (ref 26.0–34.0)
MCHC: 33 g/dL (ref 30.0–36.0)
MCV: 93.4 fL (ref 80.0–100.0)
Monocytes Absolute: 0.5 10*3/uL (ref 0.1–1.0)
Monocytes Relative: 6 %
Neutro Abs: 5.8 10*3/uL (ref 1.7–7.7)
Neutrophils Relative %: 71 %
Platelet Count: 167 10*3/uL (ref 150–400)
RBC: 4.38 MIL/uL (ref 4.22–5.81)
RDW: 13 % (ref 11.5–15.5)
WBC Count: 8.1 10*3/uL (ref 4.0–10.5)
nRBC: 0 % (ref 0.0–0.2)

## 2021-06-18 LAB — CMP (CANCER CENTER ONLY)
ALT: 13 U/L (ref 0–44)
AST: 19 U/L (ref 15–41)
Albumin: 4 g/dL (ref 3.5–5.0)
Alkaline Phosphatase: 57 U/L (ref 38–126)
Anion gap: 8 (ref 5–15)
BUN: 16 mg/dL (ref 8–23)
CO2: 30 mmol/L (ref 22–32)
Calcium: 9.6 mg/dL (ref 8.9–10.3)
Chloride: 100 mmol/L (ref 98–111)
Creatinine: 1.04 mg/dL (ref 0.61–1.24)
GFR, Estimated: >60 mL/min (ref >60)
Glucose, Bld: 117 mg/dL — ABNORMAL HIGH (ref 70–99)
Potassium: 4.1 mmol/L (ref 3.5–5.1)
Sodium: 138 mmol/L (ref 135–145)
Total Bilirubin: 0.8 mg/dL (ref 0.3–1.2)
Total Protein: 7.8 g/dL (ref 6.5–8.1)

## 2021-06-18 LAB — RETICULOCYTES
Immature Retic Fract: 13.3 % (ref 2.3–15.9)
RBC.: 4.36 MIL/uL (ref 4.22–5.81)
Retic Count, Absolute: 68.5 10*3/uL (ref 19.0–186.0)
Retic Ct Pct: 1.6 % (ref 0.4–3.1)

## 2021-06-18 MED ORDER — LANREOTIDE ACETATE 120 MG/0.5ML ~~LOC~~ SOLN
120.0000 mg | Freq: Once | SUBCUTANEOUS | Status: AC
  Administered 2021-06-18: 120 mg via SUBCUTANEOUS

## 2021-06-18 NOTE — Patient Instructions (Signed)
Lanreotide injection What is this medication? LANREOTIDE (lan REE oh tide) is used to reduce blood levels of growth hormone in patients with a condition called acromegaly. It also works to slow or stop tumor growth in patients with neuroendocrine tumors and treat carcinoid syndrome. This medicine may be used for other purposes; ask your health care provider or pharmacist if you have questions. COMMON BRAND NAME(S): Somatuline [place] What should I tell my care team before I take this medication? They need to know if you have any of these conditions: diabetes gallbladder disease heart disease kidney disease liver disease thyroid disease an unusual or allergic reaction to lanreotide, other medicines, foods, dyes, or preservatives pregnant or trying to get pregnant breast-feeding How should I use this medication? This medicine is for injection under the skin. It is given by a health care professional in a hospital or clinic setting. Contact your pediatrician or health care professional regarding the use of this medicine in children. Special care may be needed. Overdosage: If you think you have taken too much of this medicine contact a poison control center or emergency room at once. NOTE: This medicine is only for you. Do not share this medicine with others. What if I miss a dose? It is important not to miss your dose. Call your doctor or health care professional if you are unable to keep an appointment. What may interact with this medication? This medicine may interact with the following medications: bromocriptine cyclosporine certain medicines for blood pressure, heart disease, irregular heart beat certain medicines for diabetes quinidine terfenadine This list may not describe all possible interactions. Give your health care provider a list of all the medicines, herbs, non-prescription drugs, or dietary supplements you use. Also tell them if you smoke, drink alcohol, or use illegal drugs.  Some items may interact with your medicine. What should I watch for while using this medication? Tell your doctor or healthcare professional if your symptoms do not start to get better or if they get worse. Visit your doctor or health care professional for regular checks on your progress. Your condition will be monitored carefully while you are receiving this medicine. This medicine may increase blood sugar. Ask your healthcare provider if changes in diet or medicines are needed if you have diabetes. You may need blood work done while you are taking this medicine. Women should inform their doctor if they wish to become pregnant or think they might be pregnant. There is a potential for serious side effects to an unborn child. Talk to your health care professional or pharmacist for more information. Do not breast-feed an infant while taking this medicine or for 6 months after stopping it. This medicine has caused ovarian failure in some women. This medicine may interfere with the ability to have a child. Talk with your doctor or health care professional if you are concerned about your fertility. What side effects may I notice from receiving this medication? Side effects that you should report to your doctor or health care professional as soon as possible: allergic reactions like skin rash, itching or hives, swelling of the face, lips, or tongue increased blood pressure severe stomach pain signs and symptoms of hgh blood sugar such as being more thirsty or hungry or having to urinate more than normal. You may also feel very tired or have blurry vision. signs and symptoms of low blood sugar such as feeling anxious; confusion; dizziness; increased hunger; unusually weak or tired; sweating; shakiness; cold; irritable; headache; blurred vision; fast   heartbeat; loss of consciousness unusually slow heartbeat Side effects that usually do not require medical attention (report to your doctor or health care  professional if they continue or are bothersome): constipation diarrhea dizziness headache muscle pain muscle spasms nausea pain, redness, or irritation at site where injected This list may not describe all possible side effects. Call your doctor for medical advice about side effects. You may report side effects to FDA at 1-800-FDA-1088. Where should I keep my medication? This drug is given in a hospital or clinic and will not be stored at home. NOTE: This sheet is a summary. It may not cover all possible information. If you have questions about this medicine, talk to your doctor, pharmacist, or health care provider.  2022 Elsevier/Gold Standard (2018-07-27 09:13:08)  

## 2021-06-18 NOTE — Progress Notes (Signed)
Hematology and Oncology Follow Up Visit  Brian Mays [id number] [birth year] 75 y.o. 06/18/2021   Principle Diagnosis:  Metastatic low grade neuroendocrine tumor-hepatic metastases Iron deficiency anemia   Past Therapy: S/p yttrium-90 intrahepatic therapy - November 2016 Lutathera (Lu 177) injection on 02/01/2018 - s/p cycle 4   Current Therapy:        Somatuline 120 mg monthly IV Iron as indicated    Interim History:  Brian Mays is here today for follow-up.  As always, he is doing pretty well.  He really has no specific complaints.  He does have a abdominal wall hernia from past surgery.  This does not bother him all that much.    He is still working a little bit.  He is playing golf.  And sounds like he will have a nice weekend.  His last Chromogranin A level was 100.  He has had no problems with diarrhea.  There has been no issues with his heart.  He has had no cough or shortness of breath.  We do watch his iron levels.  When we saw him back in July, his ferritin was 253 with an iron saturation of 24%.  He has had no problems with leg swelling.  He has had no rashes.  He has had no headache.  Overall, his performance status is ECOG 1.     Medications:  Allergies as of 06/18/2021       Reactions   Iohexol Hives    Code: HIVES, Desc: PER [name] @ PRIMARY CARE, PT IS ALLERGIC TO CONTRAST DYE 10/02/08/RM  05/01/10...needs full premeds per our protocol w/ [facility]., Onset Date: [id number]        Medication List        Accurate as of June 18, 2021  2:56 PM. If you have any questions, ask your nurse or doctor.          aspirin EC 81 MG tablet Take 81 mg by mouth every morning.   atorvastatin 20 MG tablet Commonly known as: LIPITOR Take 1 tablet (20 mg total) by mouth daily at 6 PM.   Cholecalciferol 25 MCG (1000 UT) tablet Take 1,000 Units by mouth daily.   clopidogrel 75 MG tablet Commonly known as: PLAVIX TAKE 1 TABLET BY MOUTH EVERY DAY   fluticasone 50  MCG/ACT nasal spray Commonly known as: FLONASE Place 2 sprays into both nostrils daily as needed (sinuses).   hydrochlorothiazide 25 MG tablet Commonly known as: HYDRODIURIL TAKE 1/2 TABLET BY MOUTH EVERY DAY   hydrocortisone 25 MG suppository Commonly known as: ANUSOL-HC INSERT 1 SUPPOSITORY RECTALLY TWICE A DAY AS NEEDED FOR HEMORRHOIDS   ipratropium 0.03 % nasal spray Commonly known as: Atrovent Place 2 sprays into the nose 3 (three) times daily.   lansoprazole 30 MG capsule Commonly known as: PREVACID Take 1 capsule (30 mg total) by mouth daily at 12 noon.   levothyroxine 50 MCG tablet Commonly known as: SYNTHROID Take 1 tablet (50 mcg total) by mouth daily before breakfast.   losartan 100 MG tablet Commonly known as: COZAAR TAKE 1 TABLET BY MOUTH EVERY DAY   metoprolol tartrate 25 MG tablet Commonly known as: LOPRESSOR Take 0.5 tablets (12.5 mg total) by mouth 2 (two) times daily.   nitroGLYCERIN 0.4 MG SL tablet Commonly known as: NITROSTAT PLACE 1 TABLET UNDER THE TONGUE EVERY 5 (FIVE) MINUTES X 3 DOSES AS NEEDED FOR CHEST PAIN.   sodium chloride 0.65 % Soln nasal spray Commonly known as: OCEAN Place 1  spray into both nostrils as needed for congestion.   SOMATULINE DEPOT [place] Inject 120 mcg into the skin every 28 (twenty-eight) days. Receives at Dr Antonieta Pert office   vitamin B-12 1000 MCG tablet Commonly known as: CYANOCOBALAMIN Take 1,000 mcg by mouth every evening.        Allergies:  Allergies  Allergen Reactions   Iohexol Hives     Code: HIVES, Desc: PER [name] @ PRIMARY CARE, PT IS ALLERGIC TO CONTRAST DYE 10/02/08/RM  05/01/10...needs full premeds per our protocol w/ [facility]., Onset Date: [id number]     Past Medical History, Surgical history, Social history, and Family History were reviewed and updated.  Review of Systems: Review of Systems  Constitutional: Negative.   HENT: Negative.    Eyes: Negative.   Respiratory: Negative.     Cardiovascular: Negative.   Gastrointestinal: Negative.   Genitourinary: Negative.   Musculoskeletal: Negative.   Skin: Negative.   Neurological: Negative.   Endo/Heme/Allergies: Negative.   Psychiatric/Behavioral: Negative.      Physical Exam:  weight is 206 lb (93.4 kg). His oral temperature is 98.8 F (37.1 C). His blood pressure is 134/65 and his pulse is 75. His respiration is 16 and oxygen saturation is 98%.   Wt Readings from Last 3 Encounters:  06/18/21 206 lb (93.4 kg)  06/17/21 206 lb (93.4 kg)  05/20/21 205 lb (93 kg)    Physical Exam Vitals reviewed.  HENT:     Head: Normocephalic and atraumatic.  Eyes:     Pupils: Pupils are equal, round, and reactive to light.  Cardiovascular:     Rate and Rhythm: Normal rate and regular rhythm.     Heart sounds: Normal heart sounds.  Pulmonary:     Effort: Pulmonary effort is normal.     Breath sounds: Normal breath sounds.  Abdominal:     General: Bowel sounds are normal.     Palpations: Abdomen is soft.  Musculoskeletal:        General: No tenderness or deformity. Normal range of motion.     Cervical back: Normal range of motion.  Lymphadenopathy:     Cervical: No cervical adenopathy.  Skin:    General: Skin is warm and dry.     Findings: No erythema or rash.  Neurological:     Mental Status: He is alert and oriented to person, place, and time.  Psychiatric:        Behavior: Behavior normal.        Thought Content: Thought content normal.        Judgment: Judgment normal.     Lab Results  Component Value Date   WBC 8.1 06/18/2021   HGB 13.5 06/18/2021   HCT 40.9 06/18/2021   MCV 93.4 06/18/2021   PLT 167 06/18/2021   Lab Results  Component Value Date   FERRITIN 253 05/20/2021   IRON 73 05/20/2021   TIBC 309 05/20/2021   UIBC 236 05/20/2021   IRONPCTSAT 24 05/20/2021   Lab Results  Component Value Date   RETICCTPCT 1.6 06/18/2021   RBC 4.36 06/18/2021   RBC 4.38 06/18/2021   No results found  for: KPAFRELGTCHN, LAMBDASER, KAPLAMBRATIO No results found for: IGGSERUM, IGA, IGMSERUM No results found for: Ronnald Ramp, A1GS, A2GS, Tillman Sers, SPEI   Chemistry      Component Value Date/Time   NA 138 06/18/2021 1336   NA 138 09/15/2018 0934   NA 144 10/04/2017 0904   NA 141 07/23/2016 1255   K  4.1 06/18/2021 1336   K 3.8 10/04/2017 0904   K 4.1 07/23/2016 1255   CL 100 06/18/2021 1336   CL 103 10/04/2017 0904   CO2 30 06/18/2021 1336   CO2 28 10/04/2017 0904   CO2 27 07/23/2016 1255   BUN 16 06/18/2021 1336   BUN 14 09/15/2018 0934   BUN 14 10/04/2017 0904   BUN 13.8 07/23/2016 1255   CREATININE 1.04 06/18/2021 1336   CREATININE 0.8 10/04/2017 0904   CREATININE 1.0 07/23/2016 1255      Component Value Date/Time   CALCIUM 9.6 06/18/2021 1336   CALCIUM 9.4 10/04/2017 0904   CALCIUM 9.2 07/23/2016 1255   ALKPHOS 57 06/18/2021 1336   ALKPHOS 64 10/04/2017 0904   ALKPHOS 77 07/23/2016 1255   AST 19 06/18/2021 1336   AST 23 07/23/2016 1255   ALT 13 06/18/2021 1336   ALT 22 10/04/2017 0904   ALT 13 07/23/2016 1255   BILITOT 0.8 06/18/2021 1336   BILITOT 1.13 07/23/2016 1255       Impression and Plan: Mr. Ferrari is a very pleasant 75 yo caucasian gentleman with metastatic low-grade neuroendocrine carcinoma with hepatic metastasis.  He seems to doing pretty well.  He really is not symptomatic.  I am quite happy about this.  The Somatuline seems to be doing well for him.  I am glad that the Chromogranin A level is doing so nicely.  We will have to see if the level is now.  I just do not see any indication that we have to do any CAT scans on him right now.  We will continue to follow him monthly.  I am just glad that his quality of life is doing so well.  I am happy that his wife seems to be doing better.      Volanda Napoleon, MD 8/18/20222:56 PM

## 2021-06-18 NOTE — Telephone Encounter (Signed)
Appts made per 06/18/21 los and pt req to view on my chart  Brian Mays

## 2021-06-19 ENCOUNTER — Encounter: Payer: Self-pay | Admitting: Family Medicine

## 2021-06-19 LAB — IRON AND TIBC
Iron: 62 ug/dL (ref 42–163)
Saturation Ratios: 21 % (ref 20–55)
TIBC: 296 ug/dL (ref 202–409)
UIBC: 234 ug/dL (ref 117–376)

## 2021-06-19 LAB — CHROMOGRANIN A: Chromogranin A (ng/mL): 92 ng/mL (ref 0.0–101.8)

## 2021-06-19 LAB — FERRITIN: Ferritin: 310 ng/mL (ref 24–336)

## 2021-06-30 ENCOUNTER — Ambulatory Visit (INDEPENDENT_AMBULATORY_CARE_PROVIDER_SITE_OTHER): Payer: Medicare HMO

## 2021-06-30 ENCOUNTER — Ambulatory Visit (INDEPENDENT_AMBULATORY_CARE_PROVIDER_SITE_OTHER): Payer: Medicare HMO | Admitting: Orthopaedic Surgery

## 2021-06-30 ENCOUNTER — Other Ambulatory Visit: Payer: Self-pay

## 2021-06-30 ENCOUNTER — Encounter: Payer: Self-pay | Admitting: Orthopaedic Surgery

## 2021-06-30 VITALS — Ht 70.5 in | Wt 205.0 lb

## 2021-06-30 DIAGNOSIS — M1711 Unilateral primary osteoarthritis, right knee: Secondary | ICD-10-CM

## 2021-06-30 DIAGNOSIS — M7062 Trochanteric bursitis, left hip: Secondary | ICD-10-CM

## 2021-06-30 MED ORDER — LIDOCAINE HCL 1 % IJ SOLN
2.0000 mL | INTRAMUSCULAR | Status: AC | PRN
  Administered 2021-06-30: 2 mL

## 2021-06-30 MED ORDER — BUPIVACAINE HCL 0.25 % IJ SOLN
2.0000 mL | INTRAMUSCULAR | Status: AC | PRN
Start: 2021-06-30 — End: 2021-06-30
  Administered 2021-06-30: 2 mL via INTRA_ARTICULAR

## 2021-06-30 MED ORDER — METHYLPREDNISOLONE ACETATE 40 MG/ML IJ SUSP
40.0000 mg | INTRAMUSCULAR | Status: AC | PRN
  Administered 2021-06-30: 40 mg via INTRA_ARTICULAR

## 2021-06-30 NOTE — Progress Notes (Signed)
Office Visit Note   Patient: Brian Mays           Date of Birth: Sep 23, 1946           MRN: [id number] Visit Date: 06/30/2021              Requested by: Darreld Mclean, MD [address] [place],  [place] [id number] PCP: Darreld Mclean, MD   Assessment & Plan: Visit Diagnoses:  1. Unilateral primary osteoarthritis, right knee   2. Trochanteric bursitis of left hip     Plan: Impression is right knee degenerative medial meniscus tear and left hip trochanteric bursitis.  In regards to the right knee, we have discussed cortisone injection for which she would like to proceed.  In regards to the left hip, we have discussed injection versus IT band stretching.  He would like to try the stretching for now.  If his symptoms do not resolve, he will follow-up with [place] for injection.  Call with concerns or questions in meantime.  Follow-Up Instructions: Return if symptoms worsen or fail to improve.   Orders:  Orders Placed This Encounter  Procedures   Large Joint Inj: R knee   XR HIP UNILAT W OR W/O PELVIS 2-3 VIEWS LEFT   No orders of the defined types were placed in this encounter.     Procedures: Large Joint Inj: R knee on 06/30/2021 9:55 AM Indications: pain Details: 22 G needle, anterolateral approach Medications: 2 mL lidocaine 1 %; 2 mL bupivacaine 0.25 %; 40 mg methylPREDNISolone acetate 40 MG/ML     Clinical Data: No additional findings.   Subjective: Chief Complaint  Patient presents with   Left Hip - Pain    HPI patient is a pleasant 75 year old gentleman who comes in today with right knee and left hip pain.  Regards to the right knee, he has had pain here for the past 2 months.  No known injury, but he does note that he has been doing a lot of Trail walking where he walks about 5 to 6 miles at a time.  The pain he has is to the entire knee.  He has associated stiffness.  No mechanical symptoms.  It is worse with flexion of the knee.  He has  been using ice and a knee sleeve without significant relief.  He has tried over-the-counter pain medication without relief.  No previous cortisone injections.  In regards to the left hip, the pain he has is to the lateral aspect and is been ongoing for the past few weeks.  Pain is worse with sitting or sleeping on the left side.  He denies any paresthesias to the left lower extremity.  No anterior thigh or groin pain.  Review of Systems as detailed in HPI.  All others reviewed and are negative.   Objective: Vital Signs: Ht 5' 10.5" (1.791 m)   Wt 205 lb (93 kg)   BMI 29.00 kg/m   Physical Exam well-developed well-nourished gentleman in no acute distress.  Alert and oriented x3.  Ortho Exam right knee exam shows a small effusion.  Range of motion 0 to 110 degrees.  Medial joint line tenderness.  Moderate patellofemoral crepitus.  Ligaments are stable.  He is neurovascular tact distally.  Left hip exam shows moderate tenderness along the greater trochanter.  Negative logroll and negative FADIR.  Negative straight leg raise.  No focal weakness.  He is neurovascular tact distally.  Specialty Comments:  No specialty  comments available.  Imaging: XR HIP UNILAT W OR W/O PELVIS 2-3 VIEWS LEFT  Result Date: 06/30/2021 Mild to moderate narrowing of the left hip joint    PMFS History: Patient Active Problem List   Diagnosis Date Noted   Near syncope 04/22/2020   Iron deficiency anemia due to chronic blood loss 03/29/2019   Iron malabsorption 03/29/2019   Lumbar radiculopathy 08/16/2017   CAD (coronary artery disease) 09/21/2016   Unstable angina ([place]) 02/09/2016   Vitamin D deficiency 03/11/2015   Metastatic malignant neuroendocrine tumor to liver ([place]) 02/14/2015   Hyperglycemia 04/03/2012   Diarrhea 03/29/2012   B12 deficiency 04/19/2011   TOBACCO USE, QUIT 10/13/2009   Unspecified vitamin D deficiency 06/19/2009   Neoplasm by body site 12/23/2008   DIVERTICULOSIS, COLON 11/08/2008    ABDOMINAL PAIN 10/02/2008   Hypothyroidism 12/13/2007   HEMORRHOIDS, NOS 12/13/2007   COLONIC POLYPS, HX OF 12/13/2007   Essential hypertension 09/15/2007   GERD 09/15/2007   PANCREATITIS, CHRONIC 09/15/2007   CARDIAC MURMUR 09/15/2007   SNORING 09/15/2007   Past Medical History:  Diagnosis Date   Anemia    in past   Colon polyps    Diverticulosis    Gallstones    GERD (gastroesophageal reflux disease)    Heart attack ([place])    mild, Spring 2017   Heart murmur    Hemorrhoid    Hiatal hernia    HTN (hypertension)    Hypothyroidism    Iron deficiency anemia due to chronic blood loss 03/29/2019   Iron malabsorption 03/29/2019   Metastatic carcinoma ([facility]) 2010   Dr Jonette Eva   Pancreatitis 1998   chronic    Family History  Problem Relation Age of Onset   Kidney disease Mother    Hyperlipidemia Mother    Hypertension Mother    COPD Father    Ulcerative colitis Daughter     Past Surgical History:  Procedure Laterality Date   [place] N/A 02/10/2016   Procedure: Left Heart Cath and Coronary Angiography;  Surgeon: Adrian Prows, MD;  Location: [facility];  Service: Cardiovascular;  Laterality: N/A;   CARDIAC CATHETERIZATION  02/10/2016   Procedure: Coronary/Graft Atherectomy;  Surgeon: Adrian Prows, MD;  Location: [facility];  Service: Cardiovascular;;   CARDIAC CATHETERIZATION  02/10/2016   Procedure: Coronary Stent Intervention;  Surgeon: Adrian Prows, MD;  Location: [facility];  Service: Cardiovascular;;   CATARACT EXTRACTION W/ INTRAOCULAR LENS  IMPLANT, BILATERAL Bilateral    IR GENERIC HISTORICAL  12/30/2015   IR RADIOLOGIST EVAL & MGMT 12/30/2015 Aletta Edouard, MD [facility] INTER[facility] RAD   IR GENERIC HISTORICAL  11/09/2016   IR RADIOLOGIST EVAL & MGMT 11/09/2016 Aletta Edouard, MD [facility]   IR RADIOLOGIST EVAL & MGMT  03/01/2017   IR RADIOLOGIST EVAL & MGMT  06/29/2017   LAPAROSCOPIC CHOLECYSTECTOMY  1999   LIVER BIOPSY   2010   RADIOACTIVE SEED IMPLANT  X 3   "to my liver"   TUMOR EXCISION  01/2009   Carcinoil Resection    TUMOR REMOVAL     from small intestine   Social History   Occupational History   Occupation: Scientist, 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  Tobacco Use   Smoking status: Former    Packs/day: 1.50    Years: 35.00    Pack years: 52.50    Types: Cigarettes    Start date: 09/25/1959    Quit date: 12/21/1997  Years since quitting: 23.5   Smokeless tobacco: Never  Vaping Use   Vaping Use: Never used  Substance and Sexual Activity   Alcohol use: Yes    Alcohol/week: 3.0 - 4.0 standard drinks    Types: 3 - 4 Cans of beer per week    Comment: 4 beers weekly    Drug use: No   Sexual activity: Yes

## 2021-07-12 ENCOUNTER — Other Ambulatory Visit: Payer: Self-pay | Admitting: Family Medicine

## 2021-07-12 DIAGNOSIS — E039 Hypothyroidism, unspecified: Secondary | ICD-10-CM

## 2021-07-13 ENCOUNTER — Other Ambulatory Visit: Payer: Self-pay | Admitting: Family Medicine

## 2021-07-13 DIAGNOSIS — E039 Hypothyroidism, unspecified: Secondary | ICD-10-CM

## 2021-07-13 NOTE — Telephone Encounter (Signed)
rx sent 9-10

## 2021-07-23 ENCOUNTER — Encounter: Payer: Self-pay | Admitting: Hematology & Oncology

## 2021-07-23 ENCOUNTER — Inpatient Hospital Stay: Payer: Medicare HMO | Attending: Hematology & Oncology

## 2021-07-23 ENCOUNTER — Inpatient Hospital Stay (HOSPITAL_BASED_OUTPATIENT_CLINIC_OR_DEPARTMENT_OTHER): Payer: Medicare HMO | Admitting: Hematology & Oncology

## 2021-07-23 ENCOUNTER — Inpatient Hospital Stay: Payer: Medicare HMO

## 2021-07-23 ENCOUNTER — Other Ambulatory Visit: Payer: Self-pay

## 2021-07-23 VITALS — BP 118/60 | HR 66 | Temp 98.1°F | Resp 18 | Ht 70.5 in | Wt 205.4 lb

## 2021-07-23 DIAGNOSIS — D509 Iron deficiency anemia, unspecified: Secondary | ICD-10-CM | POA: Insufficient documentation

## 2021-07-23 DIAGNOSIS — C7B8 Other secondary neuroendocrine tumors: Secondary | ICD-10-CM | POA: Insufficient documentation

## 2021-07-23 DIAGNOSIS — D500 Iron deficiency anemia secondary to blood loss (chronic): Secondary | ICD-10-CM

## 2021-07-23 DIAGNOSIS — C7A8 Other malignant neuroendocrine tumors: Secondary | ICD-10-CM | POA: Diagnosis not present

## 2021-07-23 LAB — CMP (CANCER CENTER ONLY)
ALT: 16 U/L (ref 0–44)
AST: 23 U/L (ref 15–41)
Albumin: 4.2 g/dL (ref 3.5–5.0)
Alkaline Phosphatase: 54 U/L (ref 38–126)
Anion gap: 8 (ref 5–15)
BUN: 20 mg/dL (ref 8–23)
CO2: 32 mmol/L (ref 22–32)
Calcium: 9.7 mg/dL (ref 8.9–10.3)
Chloride: 99 mmol/L (ref 98–111)
Creatinine: 1.22 mg/dL (ref 0.61–1.24)
GFR, Estimated: >60 mL/min (ref >60)
Glucose, Bld: 159 mg/dL — ABNORMAL HIGH (ref 70–99)
Potassium: 4 mmol/L (ref 3.5–5.1)
Sodium: 139 mmol/L (ref 135–145)
Total Bilirubin: 0.8 mg/dL (ref 0.3–1.2)
Total Protein: 7.6 g/dL (ref 6.5–8.1)

## 2021-07-23 LAB — CBC WITH DIFFERENTIAL (CANCER CENTER ONLY)
Abs Immature Granulocytes: 0.03 10*3/uL (ref 0.00–0.07)
Basophils Absolute: 0.1 10*3/uL (ref 0.0–0.1)
Basophils Relative: 1 %
Eosinophils Absolute: 0.3 10*3/uL (ref 0.0–0.5)
Eosinophils Relative: 4 %
HCT: 41.3 % (ref 39.0–52.0)
Hemoglobin: 13.7 g/dL (ref 13.0–17.0)
Immature Granulocytes: 0 %
Lymphocytes Relative: 15 %
Lymphs Abs: 1.3 10*3/uL (ref 0.7–4.0)
MCH: 31 pg (ref 26.0–34.0)
MCHC: 33.2 g/dL (ref 30.0–36.0)
MCV: 93.4 fL (ref 80.0–100.0)
Monocytes Absolute: 0.4 10*3/uL (ref 0.1–1.0)
Monocytes Relative: 5 %
Neutro Abs: 6.3 10*3/uL (ref 1.7–7.7)
Neutrophils Relative %: 75 %
Platelet Count: 154 10*3/uL (ref 150–400)
RBC: 4.42 MIL/uL (ref 4.22–5.81)
RDW: 13.4 % (ref 11.5–15.5)
WBC Count: 8.3 10*3/uL (ref 4.0–10.5)
nRBC: 0 % (ref 0.0–0.2)

## 2021-07-23 LAB — RETICULOCYTES
Immature Retic Fract: 12.3 % (ref 2.3–15.9)
RBC.: 4.37 MIL/uL (ref 4.22–5.81)
Retic Count, Absolute: 64.2 10*3/uL (ref 19.0–186.0)
Retic Ct Pct: 1.5 % (ref 0.4–3.1)

## 2021-07-23 LAB — LACTATE DEHYDROGENASE: LDH: 184 U/L (ref 98–192)

## 2021-07-23 MED ORDER — LANREOTIDE ACETATE 120 MG/0.5ML ~~LOC~~ SOLN
120.0000 mg | Freq: Once | SUBCUTANEOUS | Status: AC
  Administered 2021-07-23: 120 mg via SUBCUTANEOUS
  Filled 2021-07-23: qty 120

## 2021-07-23 NOTE — Patient Instructions (Signed)
Lanreotide injection What is this medication? LANREOTIDE (lan REE oh tide) is used to reduce blood levels of growth hormone in patients with a condition called acromegaly. It also works to slow or stop tumor growth in patients with neuroendocrine tumors and treat carcinoid syndrome. This medicine may be used for other purposes; ask your health care provider or pharmacist if you have questions. COMMON BRAND NAME(S): Somatuline [place] What should I tell my care team before I take this medication? They need to know if you have any of these conditions: diabetes gallbladder disease heart disease kidney disease liver disease thyroid disease an unusual or allergic reaction to lanreotide, other medicines, foods, dyes, or preservatives pregnant or trying to get pregnant breast-feeding How should I use this medication? This medicine is for injection under the skin. It is given by a health care professional in a hospital or clinic setting. Contact your pediatrician or health care professional regarding the use of this medicine in children. Special care may be needed. Overdosage: If you think you have taken too much of this medicine contact a poison control center or emergency room at once. NOTE: This medicine is only for you. Do not share this medicine with others. What if I miss a dose? It is important not to miss your dose. Call your doctor or health care professional if you are unable to keep an appointment. What may interact with this medication? This medicine may interact with the following medications: bromocriptine cyclosporine certain medicines for blood pressure, heart disease, irregular heart beat certain medicines for diabetes quinidine terfenadine This list may not describe all possible interactions. Give your health care provider a list of all the medicines, herbs, non-prescription drugs, or dietary supplements you use. Also tell them if you smoke, drink alcohol, or use illegal drugs.  Some items may interact with your medicine. What should I watch for while using this medication? Tell your doctor or healthcare professional if your symptoms do not start to get better or if they get worse. Visit your doctor or health care professional for regular checks on your progress. Your condition will be monitored carefully while you are receiving this medicine. This medicine may increase blood sugar. Ask your healthcare provider if changes in diet or medicines are needed if you have diabetes. You may need blood work done while you are taking this medicine. Women should inform their doctor if they wish to become pregnant or think they might be pregnant. There is a potential for serious side effects to an unborn child. Talk to your health care professional or pharmacist for more information. Do not breast-feed an infant while taking this medicine or for 6 months after stopping it. This medicine has caused ovarian failure in some women. This medicine may interfere with the ability to have a child. Talk with your doctor or health care professional if you are concerned about your fertility. What side effects may I notice from receiving this medication? Side effects that you should report to your doctor or health care professional as soon as possible: allergic reactions like skin rash, itching or hives, swelling of the face, lips, or tongue increased blood pressure severe stomach pain signs and symptoms of hgh blood sugar such as being more thirsty or hungry or having to urinate more than normal. You may also feel very tired or have blurry vision. signs and symptoms of low blood sugar such as feeling anxious; confusion; dizziness; increased hunger; unusually weak or tired; sweating; shakiness; cold; irritable; headache; blurred vision; fast   heartbeat; loss of consciousness unusually slow heartbeat Side effects that usually do not require medical attention (report to your doctor or health care  professional if they continue or are bothersome): constipation diarrhea dizziness headache muscle pain muscle spasms nausea pain, redness, or irritation at site where injected This list may not describe all possible side effects. Call your doctor for medical advice about side effects. You may report side effects to FDA at 1-800-FDA-1088. Where should I keep my medication? This drug is given in a hospital or clinic and will not be stored at home. NOTE: This sheet is a summary. It may not cover all possible information. If you have questions about this medicine, talk to your doctor, pharmacist, or health care provider.  2022 Elsevier/Gold Standard (2018-07-27 09:13:08)  

## 2021-07-23 NOTE — Progress Notes (Signed)
Hematology and Oncology Follow Up Visit  Brian Mays [id number] 05-Oct-1946 75 y.o. 07/23/2021   Principle Diagnosis:  Metastatic low grade neuroendocrine tumor-hepatic metastases Iron deficiency anemia   Past Therapy: S/p yttrium-90 intrahepatic therapy - November 2016 Lutathera (Lu 177) injection on 02/01/2018 - s/p cycle 4   Current Therapy:        Somatuline 120 mg monthly IV Iron as indicated    Interim History:  Brian Mays is here today for follow-up.  As always, he is doing pretty well.  He really has no specific complaints.  He does have a abdominal wall hernia from past surgery.  This does not bother him all that much.    He was a little bit late today because his wife also had a doctor's appointment.  He wanted make sure that he could take her to her appointment.  This really does not surprise me.    He is still working a little bit.  He is playing golf.  As always, we talked about golf.  The [employer] Cup is being played right now in [place].    His last Chromogranin A level was 92.  His last iron studies back in August showed a ferritin of 310 with an iron saturation 21%.  His last PET scan was done back in March.  We may have to set him up with a another 1 so we see how everything is going.  He says that when he does walk that he does have some burping.  I am not sure this would be from his tumor.  It almost sounds more like a vagal nerve issue.  He has had no problems with diarrhea.    Overall, his performance status is ECOG 1.     Medications:  Allergies as of 07/23/2021       Reactions   Iohexol Hives    Code: HIVES, Desc: PER [name] @ [facility], PT IS ALLERGIC TO CONTRAST DYE 10/02/08/RM  05/01/10...needs full premeds per our protocol w/ [facility]., Onset Date: [id number]        Medication List        Accurate as of July 23, 2021  4:21 PM. If you have any questions, ask your nurse or doctor.          aspirin EC 81 MG tablet Take 81 mg  by mouth every morning.   atorvastatin 20 MG tablet Commonly known as: LIPITOR Take 1 tablet (20 mg total) by mouth daily at 6 PM.   Cholecalciferol 25 MCG (1000 UT) tablet Take 1,000 Units by mouth daily.   clopidogrel 75 MG tablet Commonly known as: PLAVIX TAKE 1 TABLET BY MOUTH EVERY DAY   fluticasone 50 MCG/ACT nasal spray Commonly known as: FLONASE Place 2 sprays into both nostrils daily as needed (sinuses).   hydrochlorothiazide 25 MG tablet Commonly known as: HYDRODIURIL TAKE 1/2 TABLET BY MOUTH EVERY DAY   hydrocortisone 25 MG suppository Commonly known as: ANUSOL-HC INSERT 1 SUPPOSITORY RECTALLY TWICE A DAY AS NEEDED FOR HEMORRHOIDS   ipratropium 0.03 % nasal spray Commonly known as: Atrovent Place 2 sprays into the nose 3 (three) times daily.   lansoprazole 30 MG capsule Commonly known as: PREVACID Take 1 capsule (30 mg total) by mouth daily at 12 noon.   levothyroxine 50 MCG tablet Commonly known as: SYNTHROID TAKE 1 TABLET BY MOUTH DAILY BEFORE BREAKFAST   losartan 100 MG tablet Commonly known as: COZAAR TAKE 1 TABLET BY MOUTH EVERY DAY   metoprolol  tartrate 25 MG tablet Commonly known as: LOPRESSOR Take 0.5 tablets (12.5 mg total) by mouth 2 (two) times daily.   nitroGLYCERIN 0.4 MG SL tablet Commonly known as: NITROSTAT PLACE 1 TABLET UNDER THE TONGUE EVERY 5 (FIVE) MINUTES X 3 DOSES AS NEEDED FOR CHEST PAIN.   sodium chloride 0.65 % Soln nasal spray Commonly known as: OCEAN Place 1 spray into both nostrils as needed for congestion.   SOMATULINE DEPOT [place] Inject 120 mcg into the skin every 28 (twenty-eight) days. Receives at Dr Antonieta Pert office   vitamin B-12 1000 MCG tablet Commonly known as: CYANOCOBALAMIN Take 1,000 mcg by mouth every evening.        Allergies:  Allergies  Allergen Reactions   Iohexol Hives     Code: HIVES, Desc: PER [name] @ [facility], PT IS ALLERGIC TO CONTRAST DYE 10/02/08/RM  05/01/10...needs full premeds per our  protocol w/ [facility]., Onset Date: [id number]     Past Medical History, Surgical history, Social history, and Family History were reviewed and updated.  Review of Systems: Review of Systems  Constitutional: Negative.   HENT: Negative.    Eyes: Negative.   Respiratory: Negative.    Cardiovascular: Negative.   Gastrointestinal: Negative.   Genitourinary: Negative.   Musculoskeletal: Negative.   Skin: Negative.   Neurological: Negative.   Endo/Heme/Allergies: Negative.   Psychiatric/Behavioral: Negative.      Physical Exam:  height is 5' 10.5" (1.791 m) and weight is 205 lb 6.4 oz (93.2 kg). His oral temperature is 98.1 F (36.7 C). His blood pressure is 118/60 and his pulse is 66. His respiration is 18 and oxygen saturation is 100%.   Wt Readings from Last 3 Encounters:  07/23/21 205 lb 6.4 oz (93.2 kg)  06/30/21 205 lb (93 kg)  06/18/21 206 lb (93.4 kg)    Physical Exam Vitals reviewed.  HENT:     Head: Normocephalic and atraumatic.  Eyes:     Pupils: Pupils are equal, round, and reactive to light.  Cardiovascular:     Rate and Rhythm: Normal rate and regular rhythm.     Heart sounds: Normal heart sounds.  Pulmonary:     Effort: Pulmonary effort is normal.     Breath sounds: Normal breath sounds.  Abdominal:     General: Bowel sounds are normal.     Palpations: Abdomen is soft.  Musculoskeletal:        General: No tenderness or deformity. Normal range of motion.     Cervical back: Normal range of motion.  Lymphadenopathy:     Cervical: No cervical adenopathy.  Skin:    General: Skin is warm and dry.     Findings: No erythema or rash.  Neurological:     Mental Status: He is alert and oriented to person, place, and time.  Psychiatric:        Behavior: Behavior normal.        Thought Content: Thought content normal.        Judgment: Judgment normal.     Lab Results  Component Value Date   WBC 8.3 07/23/2021   HGB 13.7 07/23/2021   HCT 41.3 07/23/2021    MCV 93.4 07/23/2021   PLT 154 07/23/2021   Lab Results  Component Value Date   FERRITIN 310 06/18/2021   IRON 62 06/18/2021   TIBC 296 06/18/2021   UIBC 234 06/18/2021   IRONPCTSAT 21 06/18/2021   Lab Results  Component Value Date   RETICCTPCT 1.5 07/23/2021  RBC 4.37 07/23/2021   No results found for: KPAFRELGTCHN, LAMBDASER, KAPLAMBRATIO No results found for: IGGSERUM, IGA, IGMSERUM No results found for: Odetta Pink, SPEI   Chemistry      Component Value Date/Time   NA 139 07/23/2021 1508   NA 138 09/15/2018 0934   NA 144 10/04/2017 0904   NA 141 07/23/2016 1255   K 4.0 07/23/2021 1508   K 3.8 10/04/2017 0904   K 4.1 07/23/2016 1255   CL 99 07/23/2021 1508   CL 103 10/04/2017 0904   CO2 32 07/23/2021 1508   CO2 28 10/04/2017 0904   CO2 27 07/23/2016 1255   BUN 20 07/23/2021 1508   BUN 14 09/15/2018 0934   BUN 14 10/04/2017 0904   BUN 13.8 07/23/2016 1255   CREATININE 1.22 07/23/2021 1508   CREATININE 0.8 10/04/2017 0904   CREATININE 1.0 07/23/2016 1255      Component Value Date/Time   CALCIUM 9.7 07/23/2021 1508   CALCIUM 9.4 10/04/2017 0904   CALCIUM 9.2 07/23/2016 1255   ALKPHOS 54 07/23/2021 1508   ALKPHOS 64 10/04/2017 0904   ALKPHOS 77 07/23/2016 1255   AST 23 07/23/2021 1508   AST 23 07/23/2016 1255   ALT 16 07/23/2021 1508   ALT 22 10/04/2017 0904   ALT 13 07/23/2016 1255   BILITOT 0.8 07/23/2021 1508   BILITOT 1.13 07/23/2016 1255       Impression and Plan: Brian Mays is a very pleasant 75 yo caucasian gentleman with metastatic low-grade neuroendocrine carcinoma with hepatic metastasis.  He seems to doing pretty well.  He really is not symptomatic.  I am quite happy about this.  The Somatuline seems to be doing well for him.  I am glad that the Chromogranin A level is doing so nicely.  We will have to see if the level is now.  I will try to set him up with a PET scan dedicated to  neuroendocrine tumors in October.  We will plan to see him back in 1 month.    Volanda Napoleon, MD 9/22/20224:21 PM

## 2021-07-24 LAB — IRON AND TIBC
Iron: 72 ug/dL (ref 42–163)
Saturation Ratios: 24 % (ref 20–55)
TIBC: 299 ug/dL (ref 202–409)
UIBC: 227 ug/dL (ref 117–376)

## 2021-07-24 LAB — FERRITIN: Ferritin: 282 ng/mL (ref 24–336)

## 2021-07-24 LAB — CHROMOGRANIN A: Chromogranin A (ng/mL): 180.8 ng/mL — ABNORMAL HIGH (ref 0.0–101.8)

## 2021-08-01 DIAGNOSIS — C7A8 Other malignant neuroendocrine tumors: Secondary | ICD-10-CM

## 2021-08-01 HISTORY — DX: Other malignant neuroendocrine tumors: C7A.8

## 2021-08-11 ENCOUNTER — Other Ambulatory Visit: Payer: Self-pay | Admitting: Cardiovascular Disease

## 2021-08-11 DIAGNOSIS — E7800 Pure hypercholesterolemia, unspecified: Secondary | ICD-10-CM

## 2021-08-11 DIAGNOSIS — I10 Essential (primary) hypertension: Secondary | ICD-10-CM

## 2021-08-19 ENCOUNTER — Ambulatory Visit (HOSPITAL_COMMUNITY)
Admission: RE | Admit: 2021-08-19 | Discharge: 2021-08-19 | Disposition: A | Discharge status: Home / Self Care | Payer: Medicare HMO | Source: Ambulatory Visit | Attending: Hematology & Oncology | Admitting: Hematology & Oncology

## 2021-08-19 ENCOUNTER — Other Ambulatory Visit: Payer: Self-pay

## 2021-08-19 DIAGNOSIS — D7389 Other diseases of spleen: Secondary | ICD-10-CM | POA: Diagnosis not present

## 2021-08-19 DIAGNOSIS — C7A1 Malignant poorly differentiated neuroendocrine tumors: Secondary | ICD-10-CM | POA: Diagnosis not present

## 2021-08-19 DIAGNOSIS — C7A8 Other malignant neuroendocrine tumors: Secondary | ICD-10-CM | POA: Insufficient documentation

## 2021-08-19 DIAGNOSIS — C7B8 Other secondary neuroendocrine tumors: Secondary | ICD-10-CM | POA: Diagnosis not present

## 2021-08-19 DIAGNOSIS — K7689 Other specified diseases of liver: Secondary | ICD-10-CM | POA: Diagnosis not present

## 2021-08-19 DIAGNOSIS — C787 Secondary malignant neoplasm of liver and intrahepatic bile duct: Secondary | ICD-10-CM | POA: Diagnosis not present

## 2021-08-19 IMAGING — CT NM PET SKULL BASE TO THIGH
1 of 7 series · 1 of 25 positions shown · non-contrast
Comparison: None.

CLINICAL DATA: Subsequent treatment strategy for well
differentiated neuroendocrine tumor with liver metastasis. Status
post post peptide
receptor radiotherapy (Lutathera) completed [DATE]. Yttrium 90
liver directed radio embolization [5J].

EXAM:
NUCLEAR MEDICINE PET SKULL BASE TO THIGH
TECHNIQUE: 4.1 mCi MANJU 64 DOTATATE was injected intravenously. Full-ring PET
imaging was performed from the skull base to thigh after the
radiotracer. CT data was obtained and used for attenuation
correction and anatomic localization.

[Series 4: ct sk_thigh 5.0 bf37 · axial · 5.0mm · 0.98mm/px · 1 of 243 slices shown]
[im 243/243  brain]
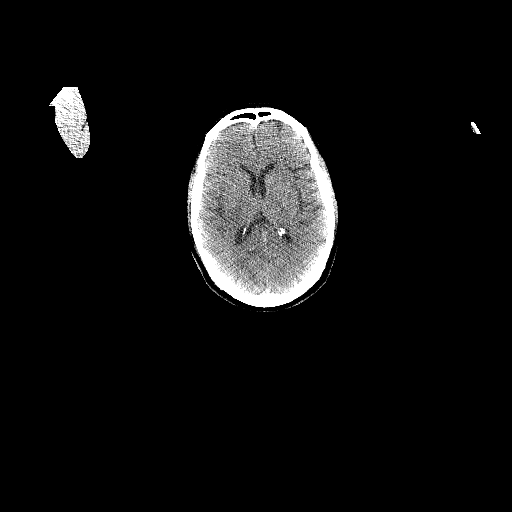

[1 of 25 positions shown; findings below may reference images not displayed]

FINDINGS: NECK

No radiotracer activity in neck lymph nodes.

Incidental CT findings: None

CHEST

No radiotracer accumulation within mediastinal or hilar lymph nodes.
No suspicious pulmonary nodules on the CT scan.

Incidental CT finding:Coronary artery calcification and aortic
atherosclerotic calcification.

ABDOMEN/PELVIS

Again demonstrated multiple hepatic lesions with intense radiotracer
activity. Visually lesion is not changed in number size and
intensity compared to prior.

Example lesion lesions:

Lesion lateral LEFT hepatic lobe measuring 4.5 cm with SUV max equal
50 compares to 4.2 cm with SUV max equal 60.

Lesion in the central LEFT hepatic lobe measuring 1.7 cm compares to
1.8 cm with SUV max equal 45.7 compared SUV max equal

Large exophytic lesion extending from the inferior margin of the
RIGHT hepatic lobe measuring 8.4 cm compared to 8.4 cm. This lesion
has peripheral radiotracer activity with SUV max equal 43.8 compared
SUV max equal 58.5. No new lesions present.

Within the central mesentery, partially calcified mass measures
cm (image 143/CT series 4) compared to 2.2 cm with SUV max equal
39.9 compared SUV max

No new mesenteric nodularity. No at abnormal activity associated the
small bowel. No peritoneal nodularity.

Diffuse radiotracer activity within the stomach with SUV max equal
15.0. This is similar to 15.7 on comparison exam. Activity noted on
multiple prior exams. No interval change.

Physiologic activity noted in the liver, spleen, adrenal glands and
kidneys.

Incidental CT findings:Atherosclerotic calcification of the aorta.

SKELETON

Small focus of activity in the T12 vertebral body again noted with
SUV max equal 5.2 compared SUV max equal

Single new focus of uptake in the T6 vertebral body at the RIGHT
pedicle with SUV max equal 7.5 (image 74).

Incidental CT findings:None
IMPRESSION: 1. Single new focus of uptake at the T6 vertebral body. Stable mild
uptake at the T12 vertebral body. Findings consistent mild
progression of small volume neuroendocrine tumor skeletal
metastasis.
2. Multiple intensely radiotracer avid hepatic metastasis not
increased in size or radiotracer activity compared to prior.
3. No increase in size or radiotracer activity of partially
calcified central mesenteric mass.
4. Moderate radiotracer activity in stomach is stable over multiple
comparison exam and is favored physiologic.

## 2021-08-19 MED ORDER — COPPER CU 64 DOTATATE 1 MCI/ML IV SOLN
4.0000 | Freq: Once | INTRAVENOUS | Status: AC
  Administered 2021-08-19: 4.06 via INTRAVENOUS

## 2021-08-20 ENCOUNTER — Other Ambulatory Visit: Payer: Self-pay | Admitting: *Deleted

## 2021-08-20 ENCOUNTER — Encounter: Payer: Self-pay | Admitting: Family Medicine

## 2021-08-20 DIAGNOSIS — D500 Iron deficiency anemia secondary to blood loss (chronic): Secondary | ICD-10-CM

## 2021-08-20 DIAGNOSIS — C7B8 Other secondary neuroendocrine tumors: Secondary | ICD-10-CM

## 2021-08-20 MED ORDER — LANSOPRAZOLE 30 MG PO CPDR: 30.0000 mg | DELAYED_RELEASE_CAPSULE | Freq: Every day | ORAL | 3 refills | Status: DC

## 2021-08-21 ENCOUNTER — Inpatient Hospital Stay: Payer: Medicare HMO

## 2021-08-21 ENCOUNTER — Inpatient Hospital Stay: Payer: Medicare HMO | Attending: Hematology & Oncology

## 2021-08-21 ENCOUNTER — Other Ambulatory Visit: Payer: Self-pay

## 2021-08-21 ENCOUNTER — Encounter: Payer: Self-pay | Admitting: Hematology & Oncology

## 2021-08-21 ENCOUNTER — Telehealth: Payer: Self-pay | Admitting: Hematology & Oncology

## 2021-08-21 ENCOUNTER — Inpatient Hospital Stay (HOSPITAL_BASED_OUTPATIENT_CLINIC_OR_DEPARTMENT_OTHER): Payer: Medicare HMO | Admitting: Hematology & Oncology

## 2021-08-21 VITALS — BP 137/69 | HR 63 | Temp 98.3°F | Resp 18 | Wt 207.0 lb

## 2021-08-21 DIAGNOSIS — C7B8 Other secondary neuroendocrine tumors: Secondary | ICD-10-CM

## 2021-08-21 DIAGNOSIS — D509 Iron deficiency anemia, unspecified: Secondary | ICD-10-CM | POA: Diagnosis not present

## 2021-08-21 DIAGNOSIS — D500 Iron deficiency anemia secondary to blood loss (chronic): Secondary | ICD-10-CM

## 2021-08-21 DIAGNOSIS — C7A8 Other malignant neuroendocrine tumors: Secondary | ICD-10-CM | POA: Diagnosis not present

## 2021-08-21 LAB — CMP (CANCER CENTER ONLY)
ALT: 15 U/L (ref 0–44)
AST: 20 U/L (ref 15–41)
Albumin: 4.1 g/dL (ref 3.5–5.0)
Alkaline Phosphatase: 54 U/L (ref 38–126)
Anion gap: 5 (ref 5–15)
BUN: 18 mg/dL (ref 8–23)
CO2: 35 mmol/L — ABNORMAL HIGH (ref 22–32)
Calcium: 10.2 mg/dL (ref 8.9–10.3)
Chloride: 100 mmol/L (ref 98–111)
Creatinine: 0.97 mg/dL (ref 0.61–1.24)
GFR, Estimated: >60 mL/min (ref >60)
Glucose, Bld: 121 mg/dL — ABNORMAL HIGH (ref 70–99)
Potassium: 4.6 mmol/L (ref 3.5–5.1)
Sodium: 140 mmol/L (ref 135–145)
Total Bilirubin: 0.9 mg/dL (ref 0.3–1.2)
Total Protein: 7.7 g/dL (ref 6.5–8.1)

## 2021-08-21 LAB — CBC WITH DIFFERENTIAL (CANCER CENTER ONLY)
Abs Immature Granulocytes: 0.04 10*3/uL (ref 0.00–0.07)
Basophils Absolute: 0.1 10*3/uL (ref 0.0–0.1)
Basophils Relative: 1 %
Eosinophils Absolute: 0.4 10*3/uL (ref 0.0–0.5)
Eosinophils Relative: 5 %
HCT: 42.5 % (ref 39.0–52.0)
Hemoglobin: 14 g/dL (ref 13.0–17.0)
Immature Granulocytes: 1 %
Lymphocytes Relative: 14 %
Lymphs Abs: 1.1 10*3/uL (ref 0.7–4.0)
MCH: 30.6 pg (ref 26.0–34.0)
MCHC: 32.9 g/dL (ref 30.0–36.0)
MCV: 93 fL (ref 80.0–100.0)
Monocytes Absolute: 0.5 10*3/uL (ref 0.1–1.0)
Monocytes Relative: 7 %
Neutro Abs: 5.6 10*3/uL (ref 1.7–7.7)
Neutrophils Relative %: 72 %
Platelet Count: 151 10*3/uL (ref 150–400)
RBC: 4.57 MIL/uL (ref 4.22–5.81)
RDW: 13.7 % (ref 11.5–15.5)
WBC Count: 7.7 10*3/uL (ref 4.0–10.5)
nRBC: 0 % (ref 0.0–0.2)

## 2021-08-21 MED ORDER — LANREOTIDE ACETATE 120 MG/0.5ML ~~LOC~~ SOLN
120.0000 mg | Freq: Once | SUBCUTANEOUS | Status: AC
  Administered 2021-08-21: 120 mg via SUBCUTANEOUS
  Filled 2021-08-21: qty 120

## 2021-08-21 NOTE — Progress Notes (Signed)
Hematology and Oncology Follow Up Visit  JABIN TAPP [id number] 06-Oct-1946 75 y.o. 08/21/2021   Principle Diagnosis:  Metastatic low grade neuroendocrine tumor-hepatic metastases Iron deficiency anemia   Past Therapy: S/p yttrium-90 intrahepatic therapy - November 2016 Lutathera (Lu 177) injection on 02/01/2018 - s/p cycle 4   Current Therapy:        Somatuline 120 mg monthly IV Iron as indicated    Interim History:  Mr. Samad is here today for follow-up.  Surprisingly, his daughter comes in with him.  I am not seen her for several years.  It is always fun talking with her.  She has a 3-year-old daughter.  I still remember when she was pregnant with her daughter.  Mr. Ostlund is feeling well.  He is going to play golf this morning.  He is still working a part-time.  We did do a special PET scan on him.  This was the Cu-64 NetDetect scan.  This was done on 08/19/2021.  This showed everything to be relatively stable.  However, there appear to be some activity at T6 which appear to be new.  His last Chromogranin A level was 181.  He has had no change in bowel or bladder habits.  He has had no bleeding.  There is no cough or shortness of breath.  He actually joined the gym at the new [employer] facility.  He really enjoys this.  He is trying his wife to join also.  He has had no chest pain.  He has had no palpitations.  He has had no headache.  There is been no leg swelling.  He has had no problems with diarrhea.  Overall, I would say his performance status is probably ECOG 1.  Medications:  Allergies as of 08/21/2021       Reactions   Iohexol Hives    Code: HIVES, Desc: PER [name] @ [facility], PT IS ALLERGIC TO CONTRAST DYE 10/02/08/RM  05/01/10...needs full premeds per our protocol w/ [facility]., Onset Date: [id number]        Medication List        Accurate as of August 21, 2021  9:04 AM. If you have any questions, ask your nurse or doctor.          aspirin EC  81 MG tablet Take 81 mg by mouth every morning.   atorvastatin 20 MG tablet Commonly known as: LIPITOR TAKE 1 TABLET BY MOUTH DAILY AT 6 PM.   Cholecalciferol 25 MCG (1000 UT) tablet Take 1,000 Units by mouth daily.   clopidogrel 75 MG tablet Commonly known as: PLAVIX TAKE 1 TABLET BY MOUTH EVERY DAY   fluticasone 50 MCG/ACT nasal spray Commonly known as: FLONASE Place 2 sprays into both nostrils daily as needed (sinuses).   hydrochlorothiazide 25 MG tablet Commonly known as: HYDRODIURIL TAKE 1/2 TABLET BY MOUTH EVERY DAY   hydrocortisone 25 MG suppository Commonly known as: ANUSOL-HC INSERT 1 SUPPOSITORY RECTALLY TWICE A DAY AS NEEDED FOR HEMORRHOIDS   ipratropium 0.03 % nasal spray Commonly known as: Atrovent Place 2 sprays into the nose 3 (three) times daily.   lansoprazole 30 MG capsule Commonly known as: PREVACID Take 1 capsule (30 mg total) by mouth daily at 12 noon.   levothyroxine 50 MCG tablet Commonly known as: SYNTHROID TAKE 1 TABLET BY MOUTH DAILY BEFORE BREAKFAST   losartan 100 MG tablet Commonly known as: COZAAR TAKE 1 TABLET BY MOUTH EVERY DAY   metoprolol tartrate 25 MG tablet Commonly known as:  LOPRESSOR TAKE 0.5 TABLETS BY MOUTH 2 TIMES DAILY.   nitroGLYCERIN 0.4 MG SL tablet Commonly known as: NITROSTAT PLACE 1 TABLET UNDER THE TONGUE EVERY 5 (FIVE) MINUTES X 3 DOSES AS NEEDED FOR CHEST PAIN.   sodium chloride 0.65 % Soln nasal spray Commonly known as: OCEAN Place 1 spray into both nostrils as needed for congestion.   SOMATULINE DEPOT [place] Inject 120 mcg into the skin every 28 (twenty-eight) days. Receives at Dr Antonieta Pert office   vitamin B-12 1000 MCG tablet Commonly known as: CYANOCOBALAMIN Take 1,000 mcg by mouth every evening.        Allergies:  Allergies  Allergen Reactions   Iohexol Hives     Code: HIVES, Desc: PER [name] @ [facility], PT IS ALLERGIC TO CONTRAST DYE 10/02/08/RM  05/01/10...needs full premeds per our protocol w/  [facility]., Onset Date: [id number]     Past Medical History, Surgical history, Social history, and Family History were reviewed and updated.  Review of Systems: Review of Systems  Constitutional: Negative.   HENT: Negative.    Eyes: Negative.   Respiratory: Negative.    Cardiovascular: Negative.   Gastrointestinal: Negative.   Genitourinary: Negative.   Musculoskeletal: Negative.   Skin: Negative.   Neurological: Negative.   Endo/Heme/Allergies: Negative.   Psychiatric/Behavioral: Negative.      Physical Exam:  weight is 207 lb (93.9 kg). His temperature is 98.3 F (36.8 C). His blood pressure is 137/69 and his pulse is 63. His respiration is 18 and oxygen saturation is 99%.   Wt Readings from Last 3 Encounters:  08/21/21 207 lb (93.9 kg)  07/23/21 205 lb 6.4 oz (93.2 kg)  06/30/21 205 lb (93 kg)    Physical Exam Vitals reviewed.  HENT:     Head: Normocephalic and atraumatic.  Eyes:     Pupils: Pupils are equal, round, and reactive to light.  Cardiovascular:     Rate and Rhythm: Normal rate and regular rhythm.     Heart sounds: Normal heart sounds.  Pulmonary:     Effort: Pulmonary effort is normal.     Breath sounds: Normal breath sounds.  Abdominal:     General: Bowel sounds are normal.     Palpations: Abdomen is soft.  Musculoskeletal:        General: No tenderness or deformity. Normal range of motion.     Cervical back: Normal range of motion.  Lymphadenopathy:     Cervical: No cervical adenopathy.  Skin:    General: Skin is warm and dry.     Findings: No erythema or rash.  Neurological:     Mental Status: He is alert and oriented to person, place, and time.  Psychiatric:        Behavior: Behavior normal.        Thought Content: Thought content normal.        Judgment: Judgment normal.     Lab Results  Component Value Date   WBC 7.7 08/21/2021   HGB 14.0 08/21/2021   HCT 42.5 08/21/2021   MCV 93.0 08/21/2021   PLT 151 08/21/2021   Lab  Results  Component Value Date   FERRITIN 282 07/23/2021   IRON 72 07/23/2021   TIBC 299 07/23/2021   UIBC 227 07/23/2021   IRONPCTSAT 24 07/23/2021   Lab Results  Component Value Date   RETICCTPCT 1.5 07/23/2021   RBC 4.57 08/21/2021   No results found for: KPAFRELGTCHN, LAMBDASER, KAPLAMBRATIO No results found for: IGGSERUM, IGA, IGMSERUM No results  found for: Odetta Pink, SPEI   Chemistry      Component Value Date/Time   NA 140 08/21/2021 0807   NA 138 09/15/2018 0934   NA 144 10/04/2017 0904   NA 141 07/23/2016 1255   K 4.6 08/21/2021 0807   K 3.8 10/04/2017 0904   K 4.1 07/23/2016 1255   CL 100 08/21/2021 0807   CL 103 10/04/2017 0904   CO2 35 (H) 08/21/2021 0807   CO2 28 10/04/2017 0904   CO2 27 07/23/2016 1255   BUN 18 08/21/2021 0807   BUN 14 09/15/2018 0934   BUN 14 10/04/2017 0904   BUN 13.8 07/23/2016 1255   CREATININE 0.97 08/21/2021 0807   CREATININE 0.8 10/04/2017 0904   CREATININE 1.0 07/23/2016 1255      Component Value Date/Time   CALCIUM 10.2 08/21/2021 0807   CALCIUM 9.4 10/04/2017 0904   CALCIUM 9.2 07/23/2016 1255   ALKPHOS 54 08/21/2021 0807   ALKPHOS 64 10/04/2017 0904   ALKPHOS 77 07/23/2016 1255   AST 20 08/21/2021 0807   AST 23 07/23/2016 1255   ALT 15 08/21/2021 0807   ALT 22 10/04/2017 0904   ALT 13 07/23/2016 1255   BILITOT 0.9 08/21/2021 0807   BILITOT 1.13 07/23/2016 1255       Impression and Plan: Mr. Eaddy is a very pleasant 75 yo caucasian gentleman with metastatic low-grade neuroendocrine carcinoma with hepatic metastasis.  He seems to doing pretty well.  He really is not symptomatic.  I am quite happy about this.  Still not sure exactly what the PET scan is showing [place].  The MRI can certainly help.  If there is an issue, then I think radiation therapy would not be a bad idea.  We will see what the chromogranin A level is.  This does tend to fluctuate.  He will get  his Somatuline today.  Again, it is really nice seeing his daughter.  It has been a long time since we saw her.  She is always so eloquent and a lot of fun to talk to.  We will plan to get her back before Thanksgiving.     Volanda Napoleon, MD 10/21/20229:04 AM

## 2021-08-21 NOTE — Patient Instructions (Signed)
Lanreotide injection What is this medication? LANREOTIDE (lan REE oh tide) is used to reduce blood levels of growth hormone in patients with a condition called acromegaly. It also works to slow or stop tumor growth in patients with neuroendocrine tumors and treat carcinoid syndrome. This medicine may be used for other purposes; ask your health care provider or pharmacist if you have questions. COMMON BRAND NAME(S): Somatuline [place] What should I tell my care team before I take this medication? They need to know if you have any of these conditions: diabetes gallbladder disease heart disease kidney disease liver disease thyroid disease an unusual or allergic reaction to lanreotide, other medicines, foods, dyes, or preservatives pregnant or trying to get pregnant breast-feeding How should I use this medication? This medicine is for injection under the skin. It is given by a health care professional in a hospital or clinic setting. Contact your pediatrician or health care professional regarding the use of this medicine in children. Special care may be needed. Overdosage: If you think you have taken too much of this medicine contact a poison control center or emergency room at once. NOTE: This medicine is only for you. Do not share this medicine with others. What if I miss a dose? It is important not to miss your dose. Call your doctor or health care professional if you are unable to keep an appointment. What may interact with this medication? This medicine may interact with the following medications: bromocriptine cyclosporine certain medicines for blood pressure, heart disease, irregular heart beat certain medicines for diabetes quinidine terfenadine This list may not describe all possible interactions. Give your health care provider a list of all the medicines, herbs, non-prescription drugs, or dietary supplements you use. Also tell them if you smoke, drink alcohol, or use illegal drugs.  Some items may interact with your medicine. What should I watch for while using this medication? Tell your doctor or healthcare professional if your symptoms do not start to get better or if they get worse. Visit your doctor or health care professional for regular checks on your progress. Your condition will be monitored carefully while you are receiving this medicine. This medicine may increase blood sugar. Ask your healthcare provider if changes in diet or medicines are needed if you have diabetes. You may need blood work done while you are taking this medicine. Women should inform their doctor if they wish to become pregnant or think they might be pregnant. There is a potential for serious side effects to an unborn child. Talk to your health care professional or pharmacist for more information. Do not breast-feed an infant while taking this medicine or for 6 months after stopping it. This medicine has caused ovarian failure in some women. This medicine may interfere with the ability to have a child. Talk with your doctor or health care professional if you are concerned about your fertility. What side effects may I notice from receiving this medication? Side effects that you should report to your doctor or health care professional as soon as possible: allergic reactions like skin rash, itching or hives, swelling of the face, lips, or tongue increased blood pressure severe stomach pain signs and symptoms of hgh blood sugar such as being more thirsty or hungry or having to urinate more than normal. You may also feel very tired or have blurry vision. signs and symptoms of low blood sugar such as feeling anxious; confusion; dizziness; increased hunger; unusually weak or tired; sweating; shakiness; cold; irritable; headache; blurred vision; fast   heartbeat; loss of consciousness unusually slow heartbeat Side effects that usually do not require medical attention (report to your doctor or health care  professional if they continue or are bothersome): constipation diarrhea dizziness headache muscle pain muscle spasms nausea pain, redness, or irritation at site where injected This list may not describe all possible side effects. Call your doctor for medical advice about side effects. You may report side effects to FDA at 1-800-FDA-1088. Where should I keep my medication? This drug is given in a hospital or clinic and will not be stored at home. NOTE: This sheet is a summary. It may not cover all possible information. If you have questions about this medicine, talk to your doctor, pharmacist, or health care provider.  2022 Elsevier/Gold Standard (2018-07-27 09:13:08)  

## 2021-08-21 NOTE — Telephone Encounter (Signed)
Scheduled appt per 10/21 los - left message for patient with appt date and time.   

## 2021-09-01 NOTE — Patient Instructions (Addendum)
It was good to see you again today, I will be in touch with your labs as soon as possible I would encourage getting the most recent COVID booster when you can Flu shot today I will be thinking of you and your wife- I am so sorry you are going through this  For the GERD sx, continue lansoprazole and we will add carafate for 10 days. Take with meals and at bedtime so up to 4x a day

## 2021-09-01 NOTE — Progress Notes (Addendum)
[facility] [employer] [place], [place],  [address] [phone]  Date:  09/03/2021   Name:  Brian Mays   DOB:  29-Nov-1945   MRN:  [id number]  PCP:  Darreld Mclean, MD    Chief Complaint: Annual Exam (CPE- non fasting )   History of Present Illness:  Brian Mays is a 75 y.o. very pleasant male patient who presents with the following:  Patient seen today for physical exam- history of metastatic malignant neuroendocrine tumor in the liver as well as iron absorption, CAD status post stenting in 2017, hypothyroidism, hypertension, chronic pancreatitis  Most recent visit with myself was in August  Seen by oncology October 21 He had a PET scan October 19 which is somewhat in determinant He is getting an MRI next week to look into this further  Principle Diagnosis:  Metastatic low grade neuroendocrine tumor-hepatic metastases Iron deficiency anemia Past Therapy: S/p yttrium-90 intrahepatic therapy - November 2016 Lutathera (Lu 177) injection on 02/01/2018 - s/p cycle 4 Current Therapy:        Somatuline 120 mg monthly IV Iron as indicated   Shingrix-recommended COVID booster-recommended Flu vaccine- give today  CMP, CBC up-to-date His B12 was too high last year- he has decreased his supplement use  He notes that his energy level is "pretty good" although not as good as he would like  He has noted some belching and gerd sx recently- he is taking prevacid 30 mg daily.  However he notes he continues to have some belching and discomfort.  Finally, Brian Mays mentions that his wife had a seizure recently and was very unfortunately diagnosed with a brain tumor.  She is having surgery for resection tomorrow. Patient Active Problem List   Diagnosis Date Noted   Near syncope 04/22/2020   Iron deficiency anemia due to chronic blood loss 03/29/2019   Iron malabsorption 03/29/2019   Lumbar radiculopathy 08/16/2017    CAD (coronary artery disease) 09/21/2016   Unstable angina ([place]) 02/09/2016   Vitamin D deficiency 03/11/2015   Metastatic malignant neuroendocrine tumor to liver ([place]) 02/14/2015   Hyperglycemia 04/03/2012   Diarrhea 03/29/2012   B12 deficiency 04/19/2011   TOBACCO USE, QUIT 10/13/2009   Unspecified vitamin D deficiency 06/19/2009   Neoplasm by body site 12/23/2008   DIVERTICULOSIS, COLON 11/08/2008   ABDOMINAL PAIN 10/02/2008   Hypothyroidism 12/13/2007   HEMORRHOIDS, NOS 12/13/2007   COLONIC POLYPS, HX OF 12/13/2007   Essential hypertension 09/15/2007   GERD 09/15/2007   PANCREATITIS, CHRONIC 09/15/2007   CARDIAC MURMUR 09/15/2007   SNORING 09/15/2007    Past Medical History:  Diagnosis Date   Anemia    in past   Colon polyps    Diverticulosis    Gallstones    GERD (gastroesophageal reflux disease)    Heart attack ([place])    mild, Spring 2017   Heart murmur    Hemorrhoid    Hiatal hernia    HTN (hypertension)    Hypothyroidism    Iron deficiency anemia due to chronic blood loss 03/29/2019   Iron malabsorption 03/29/2019   Metastatic carcinoma ([place]) 2010   Dr Jonette Eva   Pancreatitis 1998   chronic    Past Surgical History:  Procedure Laterality Date   [place] N/A 02/10/2016   Procedure: Left Heart Cath and Coronary Angiography;  Surgeon: Adrian Prows, MD;  Location: [facility];  Service: Cardiovascular;  Laterality: N/A;   CARDIAC CATHETERIZATION  02/10/2016   Procedure: Coronary/Graft Atherectomy;  Surgeon: Adrian Prows, MD;  Location: [facility];  Service: Cardiovascular;;   CARDIAC CATHETERIZATION  02/10/2016   Procedure: Coronary Stent Intervention;  Surgeon: Adrian Prows, MD;  Location: [facility];  Service: Cardiovascular;;   CATARACT EXTRACTION W/ INTRAOCULAR LENS  IMPLANT, BILATERAL Bilateral    IR GENERIC HISTORICAL  12/30/2015   IR RADIOLOGIST EVAL & MGMT 12/30/2015 Aletta Edouard, MD [facility]   IR  GENERIC HISTORICAL  11/09/2016   IR RADIOLOGIST EVAL & MGMT 11/09/2016 Aletta Edouard, MD [facility] INTER[facility] RAD   IR RADIOLOGIST EVAL & MGMT  03/01/2017   IR RADIOLOGIST EVAL & MGMT  06/29/2017   LAPAROSCOPIC CHOLECYSTECTOMY  1999   LIVER BIOPSY  2010   RADIOACTIVE SEED IMPLANT  X 3   "to my liver"   TUMOR EXCISION  01/2009   Carcinoil Resection    TUMOR REMOVAL     from small intestine    Social History   Tobacco Use   Smoking status: Former    Packs/day: 1.50    Years: 35.00    Pack years: 52.50    Types: Cigarettes    Start date: 09/25/1959    Quit date: 12/21/1997    Years since quitting: 23.7   Smokeless tobacco: Never  Vaping Use   Vaping Use: Never used  Substance Use Topics   Alcohol use: Yes    Alcohol/week: 3.0 - 4.0 standard drinks    Types: 3 - 4 Cans of beer per week    Comment: 4 beers weekly    Drug use: No    Family History  Problem Relation Age of Onset   Kidney disease Mother    Hyperlipidemia Mother    Hypertension Mother    COPD Father    Ulcerative colitis Daughter     Allergies  Allergen Reactions   Iohexol Hives     Code: HIVES, Desc: PER [name] @ [facility]IMARY [facility]ARE, PT IS ALLERGIC TO CONTRAST DYE 10/02/08/RM  05/01/10...needs full premeds per our protocol w/ [facility]., Onset Date: [id number]     Medication list has been reviewed and updated.  Current Outpatient Medications on File Prior to Visit  Medication Sig Dispense Refill   aspirin EC 81 MG tablet Take 81 mg by mouth every morning.     atorvastatin (LIPITOR) 20 MG tablet TAKE 1 TABLET BY MOUTH DAILY AT 6 PM. 90 tablet 0   Cholecalciferol 1000 UNITS tablet Take 1,000 Units by mouth daily.     clopidogrel (PLAVIX) 75 MG tablet TAKE 1 TABLET BY MOUTH EVERY DAY 90 tablet 1   fluticasone (FLONASE) 50 MCG/ACT nasal spray Place 2 sprays into both nostrils daily as needed (sinuses).   11   hydrochlorothiazide (HYDRODIURIL) 25 MG tablet TAKE 1/2 TABLET BY MOUTH EVERY DAY 45 tablet 3   ipratropium (ATROVENT)  0.03 % nasal spray Place 2 sprays into the nose 3 (three) times daily. 30 mL 6   Lanreotide Acetate (SOMATULINE DEPOT [place]) Inject 120 mcg into the skin every 28 (twenty-eight) days. Receives at Dr Antonieta Pert office     lansoprazole (PREVACID) 30 MG capsule Take 1 capsule (30 mg total) by mouth daily at 12 noon. 90 capsule 3   levothyroxine (SYNTHROID) 50 MCG tablet TAKE 1 TABLET BY MOUTH DAILY BEFORE BREAKFAST 90 tablet 0   losartan (COZAAR) 100 MG tablet TAKE 1 TABLET BY MOUTH EVERY DAY 90 tablet 1   metoprolol  tartrate (LOPRESSOR) 25 MG tablet TAKE 0.5 TABLETS BY MOUTH 2 TIMES DAILY. 90 tablet 0   sodium chloride (OCEAN) 0.65 % SOLN nasal spray Place 1 spray into both nostrils as needed for congestion.      vitamin B-12 (CYANOCOBALAMIN) 1000 MCG tablet Take 1,000 mcg by mouth every evening.      hydrocortisone (ANUSOL-HC) 25 MG suppository INSERT 1 SUPPOSITORY RECTALLY TWICE A DAY AS NEEDED FOR HEMORRHOIDS (Patient not taking: No sig reported) 30 suppository 0   nitroGLYCERIN (NITROSTAT) 0.4 MG SL tablet PLACE 1 TABLET UNDER THE TONGUE EVERY 5 (FIVE) MINUTES X 3 DOSES AS NEEDED FOR CHEST PAIN. (Patient not taking: No sig reported) 25 tablet 8   No current facility-administered medications on file prior to visit.    Review of Systems:  As per HPI- otherwise negative.   Physical Examination: Vitals:   09/03/21 0849  BP: 140/60  Pulse: 67  Temp: 97.7 F (36.5 C)  SpO2: 99%   Vitals:   09/03/21 0849  Weight: 205 lb 9.6 oz (93.3 kg)  Height: 5\' 10"  (1.778 m)   Body mass index is 29.5 kg/m. Ideal Body Weight: Weight in (lb) to have BMI = 25: 173.9  GEN: no acute distress.  Mild overweight, looks well and his normal self HEENT: Atraumatic, Normocephalic.  Hearing aids Ears and Nose: No external deformity. CV: RRR, No JVD. No thrill. No extra heart sounds. PULM: CTA B, no wheezes, crackles, rhonchi. No retractions. No resp. distress. No accessory muscle use. ABD: S, NT, ND, +BS. No  rebound. No HSM. EXTR: No c/c/e PSYCH: Normally interactive. Conversant.  He does have a mild systolic murmur which is not new   Assessment and Plan: Physical exam  B12 deficiency - Plan: Vitamin B12  Elevated glucose - Plan: Hemoglobin A1c  Screening for prostate cancer - Plan: PSA  Vitamin D deficiency - Plan: VITAMIN D 25 Hydroxy (Vit-D Deficiency, Fractures)  Acquired hypothyroidism - Plan: TSH  Essential hypertension, benign  Screening for hyperlipidemia - Plan: Lipid panel  Gastroesophageal reflux disease without esophagitis - Plan: sucralfate (CARAFATE) 1 g tablet  Need for immunization against influenza - Plan: Flu Vaccine QUAD High Dose(Fluad)  Physical exam today.  Encouraged healthy diet and exercise routine Will plan further follow- up pending labs. Flu vaccine given, encouraged COVID booster and Shingrix Will plan further follow- up pending labs. Offered support as his wife goes through her brain tumor resection Suspect increased GERD symptoms are related to stress.  We will have him continue Prevacid, add Carafate for 10 days.  If he is still having problems after his wife surgery we can have him follow-up with GI-he will see me posted  Signed Lamar Blinks, MD   Received labs as below, message to patient  Results for orders placed or performed in visit on 09/03/21  Hemoglobin A1c  Result Value Ref Range   Hgb A1c MFr Bld 6.2 4.6 - 6.5 %  Lipid panel  Result Value Ref Range   Cholesterol 104 0 - 200 mg/dL   Triglycerides 104.0 0.0 - 149.0 mg/dL   HDL 39.50 >39.00 mg/dL   VLDL 20.8 0.0 - 40.0 mg/dL   LDL Cholesterol 44 0 - 99 mg/dL   Total CHOL/HDL Ratio 3    NonHDL 64.35   PSA  Result Value Ref Range   PSA 0.48 0.10 - 4.00 ng/mL  TSH  Result Value Ref Range   TSH 2.79 0.35 - 5.50 uIU/mL  VITAMIN D 25 Hydroxy (Vit-D Deficiency,  Fractures)  Result Value Ref Range   VITD 23.61 (L) 30.00 - 100.00 ng/mL  Vitamin B12  Result Value Ref Range    Vitamin B-12 529 211 - 911 pg/mL   *Note: Due to a large number of results and/or encounters for the requested time period, some results have not been displayed. A complete set of results can be found in Results Review.

## 2021-09-02 ENCOUNTER — Other Ambulatory Visit: Payer: Self-pay

## 2021-09-03 ENCOUNTER — Ambulatory Visit (INDEPENDENT_AMBULATORY_CARE_PROVIDER_SITE_OTHER): Payer: Medicare HMO | Admitting: Family Medicine

## 2021-09-03 ENCOUNTER — Encounter: Payer: Self-pay | Admitting: Family Medicine

## 2021-09-03 VITALS — BP 140/60 | HR 67 | Temp 97.7°F | Ht 70.0 in | Wt 205.6 lb

## 2021-09-03 DIAGNOSIS — Z125 Encounter for screening for malignant neoplasm of prostate: Secondary | ICD-10-CM | POA: Diagnosis not present

## 2021-09-03 DIAGNOSIS — E559 Vitamin D deficiency, unspecified: Secondary | ICD-10-CM | POA: Diagnosis not present

## 2021-09-03 DIAGNOSIS — Z13220 Encounter for screening for lipoid disorders: Secondary | ICD-10-CM | POA: Diagnosis not present

## 2021-09-03 DIAGNOSIS — E039 Hypothyroidism, unspecified: Secondary | ICD-10-CM | POA: Diagnosis not present

## 2021-09-03 DIAGNOSIS — K219 Gastro-esophageal reflux disease without esophagitis: Secondary | ICD-10-CM | POA: Diagnosis not present

## 2021-09-03 DIAGNOSIS — I10 Essential (primary) hypertension: Secondary | ICD-10-CM

## 2021-09-03 DIAGNOSIS — Z23 Encounter for immunization: Secondary | ICD-10-CM

## 2021-09-03 DIAGNOSIS — Z0000 Encounter for general adult medical examination without abnormal findings: Secondary | ICD-10-CM

## 2021-09-03 DIAGNOSIS — R7309 Other abnormal glucose: Secondary | ICD-10-CM

## 2021-09-03 DIAGNOSIS — R7303 Prediabetes: Secondary | ICD-10-CM | POA: Insufficient documentation

## 2021-09-03 DIAGNOSIS — E538 Deficiency of other specified B group vitamins: Secondary | ICD-10-CM

## 2021-09-03 LAB — LIPID PANEL
Cholesterol: 104 mg/dL (ref 0–200)
HDL: 39.5 mg/dL (ref >39.00)
LDL Cholesterol: 44 mg/dL (ref 0–99)
NonHDL: 64.35
Total CHOL/HDL Ratio: 3
Triglycerides: 104 mg/dL (ref 0.0–149.0)
VLDL: 20.8 mg/dL (ref 0.0–40.0)

## 2021-09-03 LAB — VITAMIN D 25 HYDROXY (VIT D DEFICIENCY, FRACTURES): VITD: 23.61 ng/mL — ABNORMAL LOW (ref 30.00–100.00)

## 2021-09-03 LAB — PSA: PSA: 0.48 ng/mL (ref 0.10–4.00)

## 2021-09-03 LAB — HEMOGLOBIN A1C: Hgb A1c MFr Bld: 6.2 % (ref 4.6–6.5)

## 2021-09-03 LAB — TSH: TSH: 2.79 u[IU]/mL (ref 0.35–5.50)

## 2021-09-03 LAB — VITAMIN B12: Vitamin B-12: 529 pg/mL (ref 211–911)

## 2021-09-03 MED ORDER — SUCRALFATE 1 G PO TABS: 1.0000 g | ORAL_TABLET | Freq: Three times a day (TID) | ORAL | 0 refills | Status: DC

## 2021-09-07 NOTE — Progress Notes (Signed)
Rcvd [employer] faxed letter for for Somatuline 60mg /0.51ml. Authorization #[id number]                     [id number]  Approval good 08/27/21-02/25/22

## 2021-09-14 ENCOUNTER — Ambulatory Visit (HOSPITAL_COMMUNITY)
Admission: RE | Admit: 2021-09-14 | Discharge: 2021-09-14 | Disposition: A | Discharge status: Home / Self Care | Payer: Medicare HMO | Source: Ambulatory Visit | Attending: Hematology & Oncology | Admitting: Hematology & Oncology

## 2021-09-14 DIAGNOSIS — C7B8 Other secondary neuroendocrine tumors: Secondary | ICD-10-CM | POA: Insufficient documentation

## 2021-09-14 DIAGNOSIS — C7951 Secondary malignant neoplasm of bone: Secondary | ICD-10-CM | POA: Diagnosis not present

## 2021-09-14 DIAGNOSIS — Z86012 Personal history of benign carcinoid tumor: Secondary | ICD-10-CM | POA: Diagnosis not present

## 2021-09-14 DIAGNOSIS — D1809 Hemangioma of other sites: Secondary | ICD-10-CM | POA: Diagnosis not present

## 2021-09-14 IMAGING — MR MR THORACIC SPINE WO/W CM
7 of 8 series · 33 of 48 positions shown · IV contrast (gadavist)
Comparison: PET-CT dated [DATE].

CLINICAL DATA: History of metastatic neuroendocrine tumor with new
bone lesion at T6 seen on recent PET-CT.

EXAM:
MRI THORACIC WITHOUT AND WITH CONTRAST
TECHNIQUE: Multiplanar and multiecho pulse sequences of the thoracic spine were
obtained without and with intravenous contrast.
CONTRAST:  10mL GADAVIST GADOBUTROL 1 MMOL/ML IV SOLN

[Series 16: STIR · sagittal · 3.0mm · 1.00mm/px · 4 of 17 slices shown]
[im 1/17]
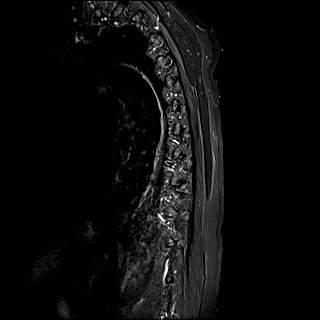
[im 6/17]
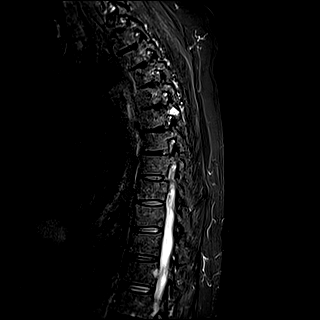
[im 11/17]
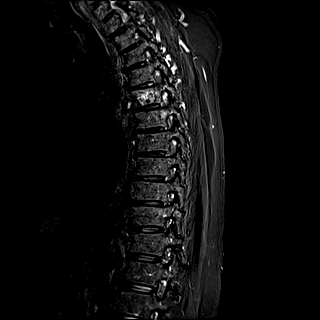
[im 17/17]
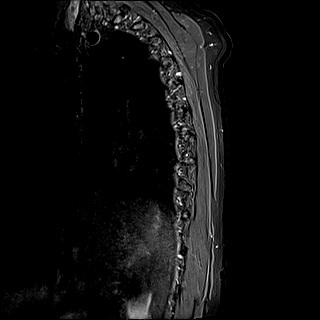

[Series 17: T1 · sagittal · 3.0mm · 1.00mm/px · 4 of 17 slices shown (1 of 2)]
[im 1/17]
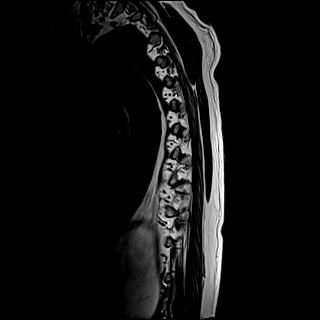
[im 6/17]
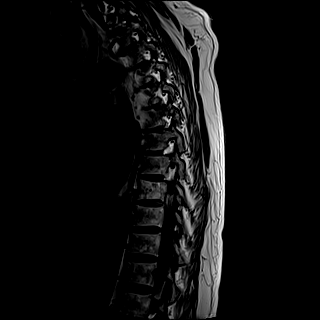
[im 11/17]
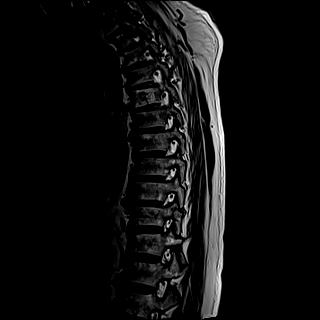
[im 17/17]
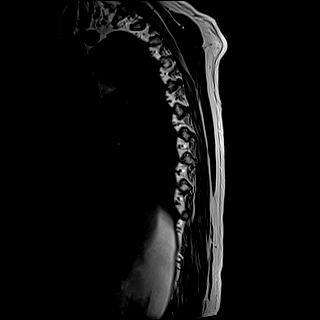

[Series 18: T2 · axial · 4.0mm · 0.78mm/px · z∈[-268,-54]mm · 8 of 34 slices shown]
[im 1/34]
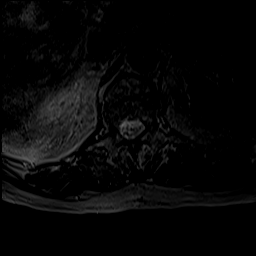
[im 5/34]
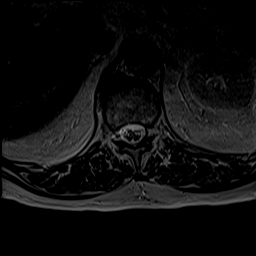
[im 10/34]
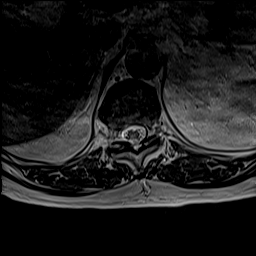
[im 15/34]
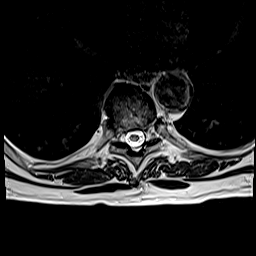
[im 19/34]
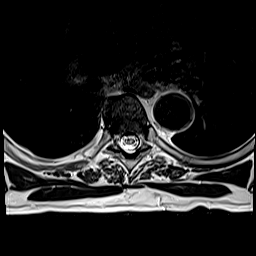
[im 24/34]
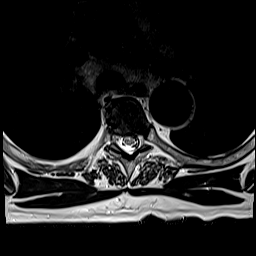
[im 29/34]
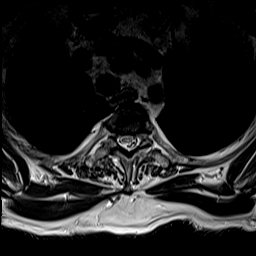
[im 34/34]
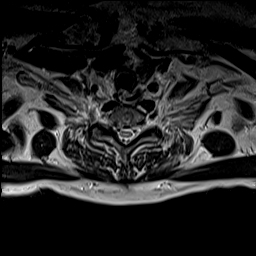

[Series 20: T1 · axial · 4.0mm · 0.39mm/px · z∈[-268,-54]mm · 8 of 34 slices shown (2 of 2)]
[im 1/34]
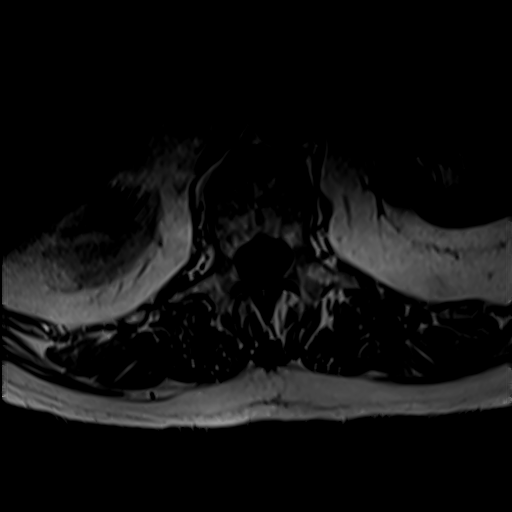
[im 5/34]
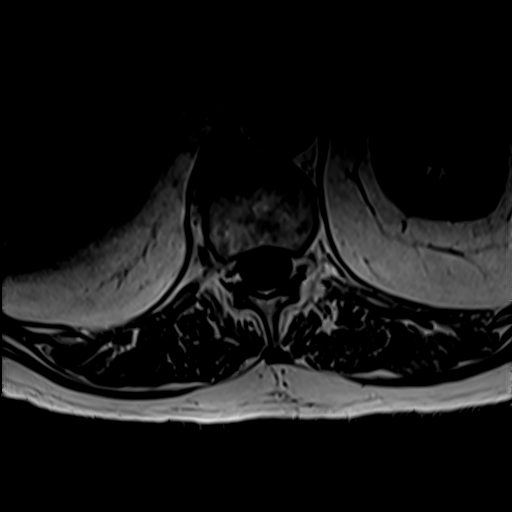
[im 10/34]
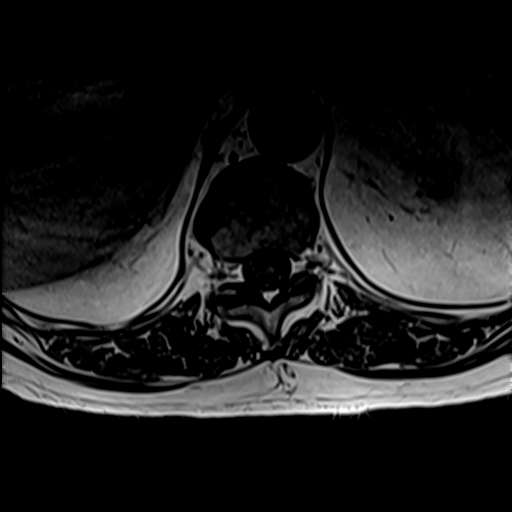
[im 15/34]
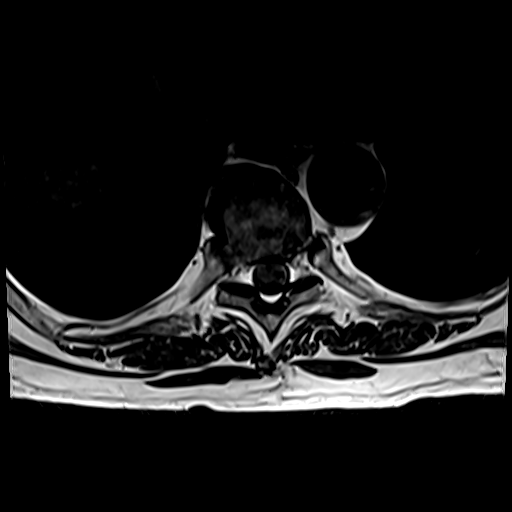
[im 19/34]
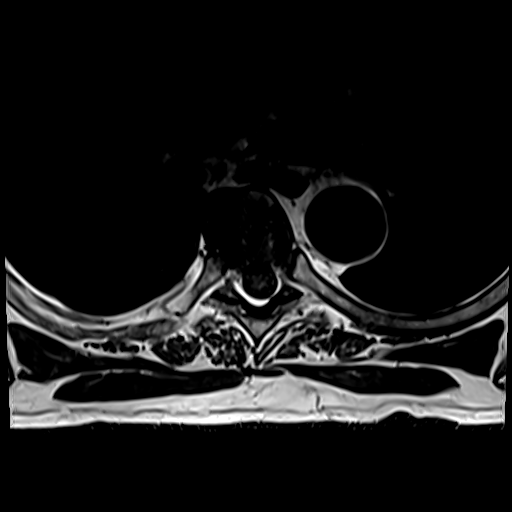
[im 24/34]
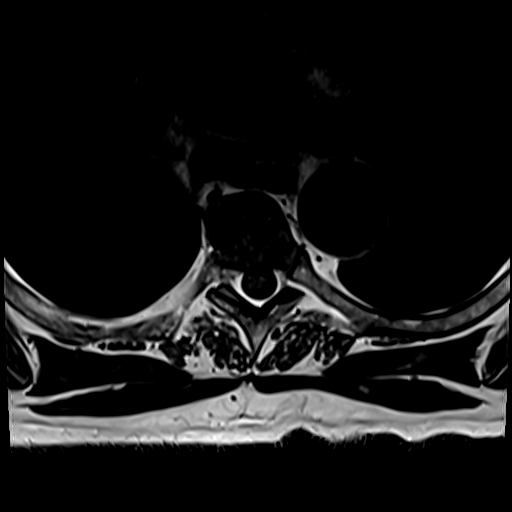
[im 29/34]
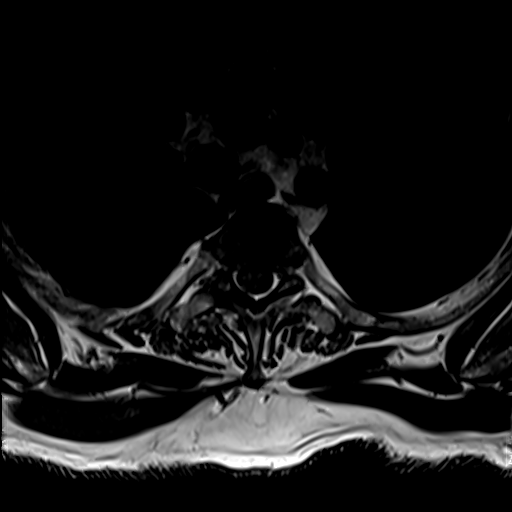
[im 34/34]
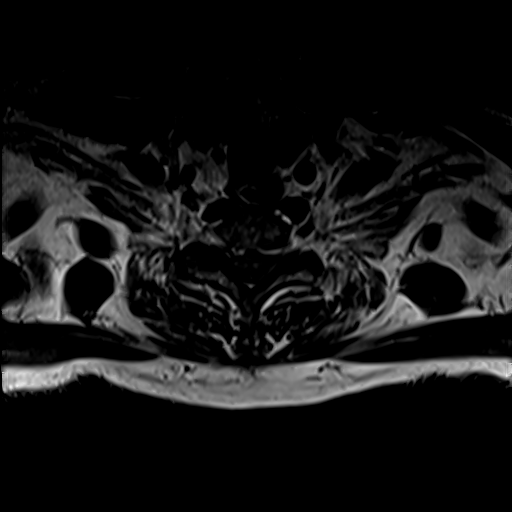

[Series 21: T2 post-contrast · sagittal · 3.0mm · 0.83mm/px · 4 of 17 slices shown]
[im 1/17]
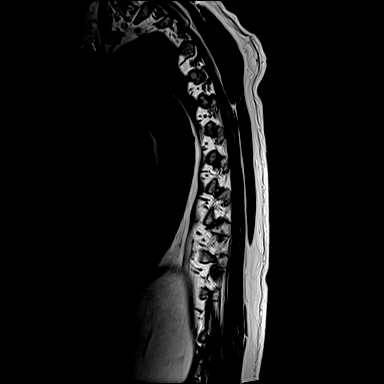
[im 6/17]
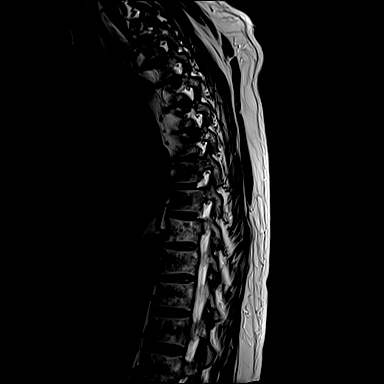
[im 11/17]
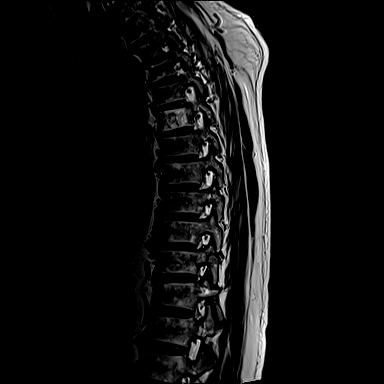
[im 17/17]
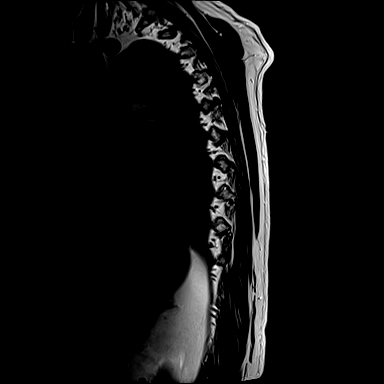

[Series 22: T1 fat-sat post-contrast · sagittal · 3.0mm · 1.00mm/px · 4 of 17 slices shown]
[im 1/17]
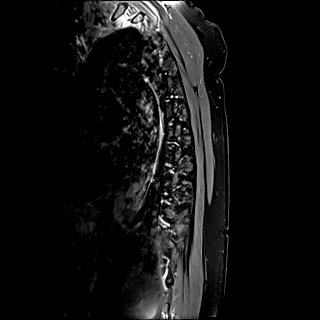
[im 6/17]
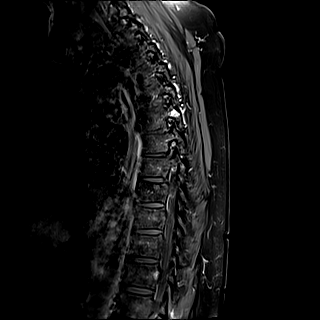
[im 11/17]
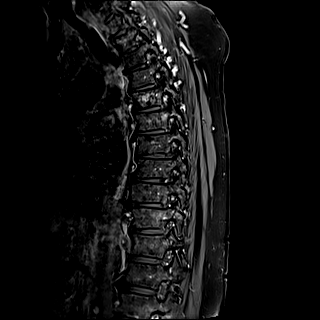
[im 17/17]
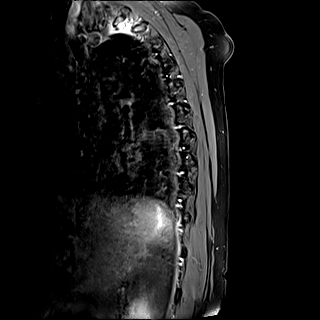

[Series 23: T1 post-contrast · axial · 4.0mm · 0.39mm/px · 1 of 34 slices shown]
[im 1/34]
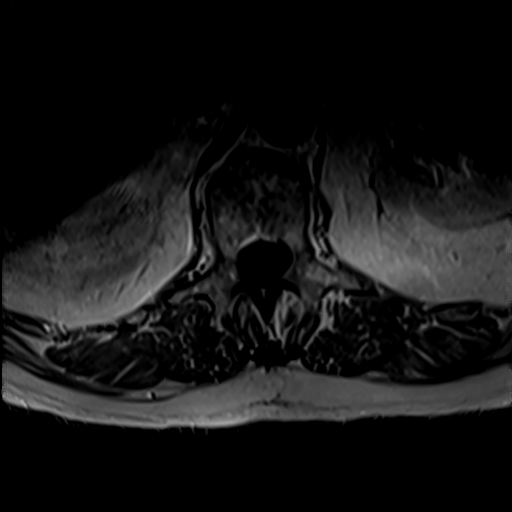

[33 of 48 positions shown; findings below may reference images not displayed]

FINDINGS: Alignment:  Physiologic.

Vertebrae: 8 mm STIR hyperintense, intensely enhancing lesion in the
left T6 posterior elements (series 22, image 12). 8 mm faintly STIR
hyperintense, enhancing lesion in the right posterior T6 vertebral
body near the pedicle (series 22, image 7). Two 8 mm faintly STIR
hyperintense, enhancing lesions in the T12 vertebral body (series
16, images 8 and 12). No fracture or evidence of discitis. T5
hemangioma.

Cord:  Normal signal and morphology.  No intradural enhancement.

Paraspinal and other soft tissues: Negative.

Disc levels:

No significant disc bulge or herniation.  No stenosis.
IMPRESSION: 1. Subcentimeter enhancing lesions at T6 and T12, consistent with
metastases. No pathologic fracture or significant degenerative
changes.

## 2021-09-14 MED ORDER — GADOBUTROL 1 MMOL/ML IV SOLN
10.0000 mL | Freq: Once | INTRAVENOUS | Status: AC | PRN
  Administered 2021-09-14: 10 mL via INTRAVENOUS

## 2021-09-18 ENCOUNTER — Inpatient Hospital Stay: Payer: Medicare HMO | Admitting: Family

## 2021-09-18 ENCOUNTER — Encounter: Payer: Self-pay | Admitting: Family

## 2021-09-18 ENCOUNTER — Inpatient Hospital Stay: Payer: Medicare HMO

## 2021-09-18 ENCOUNTER — Inpatient Hospital Stay: Payer: Medicare HMO | Attending: Hematology & Oncology

## 2021-09-18 ENCOUNTER — Other Ambulatory Visit: Payer: Self-pay

## 2021-09-18 VITALS — BP 153/70 | HR 60 | Temp 98.1°F | Resp 18 | Ht 70.0 in | Wt 205.8 lb

## 2021-09-18 DIAGNOSIS — D500 Iron deficiency anemia secondary to blood loss (chronic): Secondary | ICD-10-CM

## 2021-09-18 DIAGNOSIS — C7A8 Other malignant neuroendocrine tumors: Secondary | ICD-10-CM | POA: Insufficient documentation

## 2021-09-18 DIAGNOSIS — C801 Malignant (primary) neoplasm, unspecified: Secondary | ICD-10-CM

## 2021-09-18 DIAGNOSIS — C7951 Secondary malignant neoplasm of bone: Secondary | ICD-10-CM

## 2021-09-18 DIAGNOSIS — C7B8 Other secondary neuroendocrine tumors: Secondary | ICD-10-CM | POA: Insufficient documentation

## 2021-09-18 DIAGNOSIS — D509 Iron deficiency anemia, unspecified: Secondary | ICD-10-CM | POA: Diagnosis not present

## 2021-09-18 LAB — CMP (CANCER CENTER ONLY)
ALT: 16 U/L (ref 0–44)
AST: 24 U/L (ref 15–41)
Albumin: 4.3 g/dL (ref 3.5–5.0)
Alkaline Phosphatase: 55 U/L (ref 38–126)
Anion gap: 5 (ref 5–15)
BUN: 15 mg/dL (ref 8–23)
CO2: 34 mmol/L — ABNORMAL HIGH (ref 22–32)
Calcium: 10.3 mg/dL (ref 8.9–10.3)
Chloride: 98 mmol/L (ref 98–111)
Creatinine: 1.11 mg/dL (ref 0.61–1.24)
GFR, Estimated: >60 mL/min (ref >60)
Glucose, Bld: 106 mg/dL — ABNORMAL HIGH (ref 70–99)
Potassium: 5.2 mmol/L — ABNORMAL HIGH (ref 3.5–5.1)
Sodium: 137 mmol/L (ref 135–145)
Total Bilirubin: 0.8 mg/dL (ref 0.3–1.2)
Total Protein: 7.9 g/dL (ref 6.5–8.1)

## 2021-09-18 LAB — CBC WITH DIFFERENTIAL (CANCER CENTER ONLY)
Abs Immature Granulocytes: 0.14 10*3/uL — ABNORMAL HIGH (ref 0.00–0.07)
Basophils Absolute: 0.1 10*3/uL (ref 0.0–0.1)
Basophils Relative: 1 %
Eosinophils Absolute: 0.3 10*3/uL (ref 0.0–0.5)
Eosinophils Relative: 3 %
HCT: 40.7 % (ref 39.0–52.0)
Hemoglobin: 13.5 g/dL (ref 13.0–17.0)
Immature Granulocytes: 2 %
Lymphocytes Relative: 14 %
Lymphs Abs: 1.1 10*3/uL (ref 0.7–4.0)
MCH: 30.8 pg (ref 26.0–34.0)
MCHC: 33.2 g/dL (ref 30.0–36.0)
MCV: 92.9 fL (ref 80.0–100.0)
Monocytes Absolute: 0.7 10*3/uL (ref 0.1–1.0)
Monocytes Relative: 8 %
Neutro Abs: 5.9 10*3/uL (ref 1.7–7.7)
Neutrophils Relative %: 72 %
Platelet Count: 170 10*3/uL (ref 150–400)
RBC: 4.38 MIL/uL (ref 4.22–5.81)
RDW: 13.2 % (ref 11.5–15.5)
WBC Count: 8.1 10*3/uL (ref 4.0–10.5)
nRBC: 0 % (ref 0.0–0.2)

## 2021-09-18 MED ORDER — LANREOTIDE ACETATE 120 MG/0.5ML ~~LOC~~ SOLN
120.0000 mg | Freq: Once | SUBCUTANEOUS | Status: AC
  Administered 2021-09-18: 120 mg via SUBCUTANEOUS
  Filled 2021-09-18: qty 120

## 2021-09-18 NOTE — Patient Instructions (Signed)
Lanreotide injection °What is this medication? °LANREOTIDE (lan REE oh tide) is used to reduce blood levels of growth hormone in patients with a condition called acromegaly. It also works to slow or stop tumor growth in patients with neuroendocrine tumors and treat carcinoid syndrome. °This medicine may be used for other purposes; ask your health care provider or pharmacist if you have questions. °COMMON BRAND NAME(S): Somatuline [place] °What should I tell my care team before I take this medication? °They need to know if you have any of these conditions: °diabetes °gallbladder disease °heart disease °kidney disease °liver disease °thyroid disease °an unusual or allergic reaction to lanreotide, other medicines, foods, dyes, or preservatives °pregnant or trying to get pregnant °breast-feeding °How should I use this medication? °This medicine is for injection under the skin. It is given by a health care professional in a hospital or clinic setting. °Contact your pediatrician or health care professional regarding the use of this medicine in children. Special care may be needed. °Overdosage: If you think you have taken too much of this medicine contact a poison control center or emergency room at once. °NOTE: This medicine is only for you. Do not share this medicine with others. °What if I miss a dose? °It is important not to miss your dose. Call your doctor or health care professional if you are unable to keep an appointment. °What may interact with this medication? °This medicine may interact with the following medications: °bromocriptine °cyclosporine °certain medicines for blood pressure, heart disease, irregular heart beat °certain medicines for diabetes °quinidine °terfenadine °This list may not describe all possible interactions. Give your health care provider a list of all the medicines, herbs, non-prescription drugs, or dietary supplements you use. Also tell them if you smoke, drink alcohol, or use illegal drugs.  Some items may interact with your medicine. °What should I watch for while using this medication? °Tell your doctor or healthcare professional if your symptoms do not start to get better or if they get worse. °Visit your doctor or health care professional for regular checks on your progress. Your condition will be monitored carefully while you are receiving this medicine. °This medicine may increase blood sugar. Ask your healthcare provider if changes in diet or medicines are needed if you have diabetes. °You may need blood work done while you are taking this medicine. °Women should inform their doctor if they wish to become pregnant or think they might be pregnant. There is a potential for serious side effects to an unborn child. Talk to your health care professional or pharmacist for more information. Do not breast-feed an infant while taking this medicine or for 6 months after stopping it. °This medicine has caused ovarian failure in some women. This medicine may interfere with the ability to have a child. Talk with your doctor or health care professional if you are concerned about your fertility. °What side effects may I notice from receiving this medication? °Side effects that you should report to your doctor or health care professional as soon as possible: °allergic reactions like skin rash, itching or hives, swelling of the face, lips, or tongue °increased blood pressure °severe stomach pain °signs and symptoms of hgh blood sugar such as being more thirsty or hungry or having to urinate more than normal. You may also feel very tired or have blurry vision. °signs and symptoms of low blood sugar such as feeling anxious; confusion; dizziness; increased hunger; unusually weak or tired; sweating; shakiness; cold; irritable; headache; blurred vision; fast   heartbeat; loss of consciousness °unusually slow heartbeat °Side effects that usually do not require medical attention (report to your doctor or health care  professional if they continue or are bothersome): °constipation °diarrhea °dizziness °headache °muscle pain °muscle spasms °nausea °pain, redness, or irritation at site where injected °This list may not describe all possible side effects. Call your doctor for medical advice about side effects. You may report side effects to FDA at 1-800-FDA-1088. °Where should I keep my medication? °This drug is given in a hospital or clinic and will not be stored at home. °NOTE: This sheet is a summary. It may not cover all possible information. If you have questions about this medicine, talk to your doctor, pharmacist, or health care provider. °© 2022 [employer]vier/Gold Standard (2018-09-06 00:00:00) ° °

## 2021-09-18 NOTE — Progress Notes (Signed)
Hematology and Oncology Follow Up Visit  Brian Mays [id number] [birth year] 75 y.o. 09/18/2021   Principle Diagnosis:  Metastatic low grade neuroendocrine tumor-hepatic metastases Iron deficiency anemia   Past Therapy: S/p yttrium-90 intrahepatic therapy - November 2016 Lutathera (Lu 177) injection on 02/01/2018 - s/p cycle 4   Current Therapy:        Somatuline 120 mg monthly IV Iron as indicated    Interim History:  Brian Mays is here today for follow-up and treatment. He is doing well but states that his wife had surgery for a brain tumor and will be seeing Dr. Mickeal Skinner next week. This has been stressful for both of them.  He has occasional twinges of lower back pain but this is described as mild and tolerable.  He had sub centimeter enhancing lesions at T6 and T12 on recent PET and MRI. These were reviewed with Dr. Marin Olp and we will get him in to see Dr. Sondra Come for possible palliative radiation.  No fever, chills, n/v, cough, rash, dizziness, SOB, chest pain, palpitations, abdominal pain or changes in bowel or bladder habits.  No swelling, tenderness, numbness or tingling in his extremities.  He is eating well and staying properly hydrated. His weight is stable at 205 lbs.   ECOG Performance Status: 1 - Symptomatic but completely ambulatory  Medications:  Allergies as of 09/18/2021       Reactions   Iohexol Hives    Code: HIVES, Desc: PER [name] @ [facility]IMARY [facility]ARE, PT IS ALLERGIC TO CONTRAST DYE 10/02/08/RM  05/01/10...needs full premeds per our protocol w/ [facility]., Onset Date: [id number]        Medication List        Accurate as of September 18, 2021 12:19 PM. If you have any questions, ask your nurse or doctor.          aspirin EC 81 MG tablet Take 81 mg by mouth every morning.   atorvastatin 20 MG tablet Commonly known as: LIPITOR TAKE 1 TABLET BY MOUTH DAILY AT 6 PM.   Cholecalciferol 25 MCG (1000 UT) tablet Take 1,000 Units by mouth daily.   clopidogrel 75  MG tablet Commonly known as: PLAVIX TAKE 1 TABLET BY MOUTH EVERY DAY   fluticasone 50 MCG/ACT nasal spray Commonly known as: FLONASE Place 2 sprays into both nostrils daily as needed (sinuses).   hydrochlorothiazide 25 MG tablet Commonly known as: HYDRODIURIL TAKE 1/2 TABLET BY MOUTH EVERY DAY   hydrocortisone 25 MG suppository Commonly known as: ANUSOL-HC INSERT 1 SUPPOSITORY RECTALLY TWICE A DAY AS NEEDED FOR HEMORRHOIDS   ipratropium 0.03 % nasal spray Commonly known as: Atrovent Place 2 sprays into the nose 3 (three) times daily.   lansoprazole 30 MG capsule Commonly known as: PREVACID Take 1 capsule (30 mg total) by mouth daily at 12 noon.   levothyroxine 50 MCG tablet Commonly known as: SYNTHROID TAKE 1 TABLET BY MOUTH DAILY BEFORE BREAKFAST   losartan 100 MG tablet Commonly known as: COZAAR TAKE 1 TABLET BY MOUTH EVERY DAY   metoprolol tartrate 25 MG tablet Commonly known as: LOPRESSOR TAKE 0.5 TABLETS BY MOUTH 2 TIMES DAILY.   nitroGLYCERIN 0.4 MG SL tablet Commonly known as: NITROSTAT PLACE 1 TABLET UNDER THE TONGUE EVERY 5 (FIVE) MINUTES X 3 DOSES AS NEEDED FOR CHEST PAIN.   sodium chloride 0.65 % Soln nasal spray Commonly known as: OCEAN Place 1 spray into both nostrils as needed for congestion.   SOMATULINE DEPOT [place] Inject 120 mcg into the skin  every 28 (twenty-eight) days. Receives at Dr Antonieta Pert office   sucralfate 1 g tablet Commonly known as: Carafate Take 1 tablet (1 g total) by mouth 4 (four) times daily -  with meals and at bedtime.   vitamin B-12 1000 MCG tablet Commonly known as: CYANOCOBALAMIN Take 1,000 mcg by mouth every evening.        Allergies:  Allergies  Allergen Reactions   Iohexol Hives     Code: HIVES, Desc: PER [name] @ [facility]IMARY [facility], PT IS ALLERGIC TO CONTRAST DYE 10/02/08/RM  05/01/10...needs full premeds per our protocol w/ [facility]., Onset Date: [id number]     Past Medical History, Surgical history, Social history,  and Family History were reviewed and updated.  Review of Systems: All other 10 point review of systems is negative.   Physical Exam:  height is 5\' 10"  (1.778 m) and weight is 205 lb 12.8 oz (93.4 kg). His oral temperature is 98.1 F (36.7 C). His blood pressure is 153/70 (abnormal) and his pulse is 60. His respiration is 18 and oxygen saturation is 100%.   Wt Readings from Last 3 Encounters:  09/18/21 205 lb 12.8 oz (93.4 kg)  09/03/21 205 lb 9.6 oz (93.3 kg)  08/21/21 207 lb (93.9 kg)    Ocular: Sclerae unicteric, pupils equal, round and reactive to light Ear-nose-throat: Oropharynx clear, dentition fair Lymphatic: No cervical or supraclavicular adenopathy Lungs no rales or rhonchi, good excursion bilaterally Heart regular rate and rhythm, no murmur appreciated Abd soft, nontender, positive bowel sounds MSK no focal spinal tenderness, no joint edema Neuro: non-focal, well-oriented, appropriate affect Breasts: Deferred   Lab Results  Component Value Date   WBC 8.1 09/18/2021   HGB 13.5 09/18/2021   HCT 40.7 09/18/2021   MCV 92.9 09/18/2021   PLT 170 09/18/2021   Lab Results  Component Value Date   FERRITIN 282 07/23/2021   IRON 72 07/23/2021   TIBC 299 07/23/2021   UIBC 227 07/23/2021   IRONPCTSAT 24 07/23/2021   Lab Results  Component Value Date   RETICCTPCT 1.5 07/23/2021   RBC 4.38 09/18/2021   No results found for: KPAFRELGTCHN, LAMBDASER, KAPLAMBRATIO No results found for: Kandis Cocking, IGMSERUM No results found for: Odetta Pink, SPEI   Chemistry      Component Value Date/Time   NA 137 09/18/2021 1121   NA 138 09/15/2018 0934   NA 144 10/04/2017 0904   NA 141 07/23/2016 1255   K 5.2 (H) 09/18/2021 1121   K 3.8 10/04/2017 0904   K 4.1 07/23/2016 1255   CL 98 09/18/2021 1121   CL 103 10/04/2017 0904   CO2 34 (H) 09/18/2021 1121   CO2 28 10/04/2017 0904   CO2 27 07/23/2016 1255   BUN 15  09/18/2021 1121   BUN 14 09/15/2018 0934   BUN 14 10/04/2017 0904   BUN 13.8 07/23/2016 1255   CREATININE 1.11 09/18/2021 1121   CREATININE 0.8 10/04/2017 0904   CREATININE 1.0 07/23/2016 1255      Component Value Date/Time   CALCIUM 10.3 09/18/2021 1121   CALCIUM 9.4 10/04/2017 0904   CALCIUM 9.2 07/23/2016 1255   ALKPHOS 55 09/18/2021 1121   ALKPHOS 64 10/04/2017 0904   ALKPHOS 77 07/23/2016 1255   AST 24 09/18/2021 1121   AST 23 07/23/2016 1255   ALT 16 09/18/2021 1121   ALT 22 10/04/2017 0904   ALT 13 07/23/2016 1255   BILITOT 0.8 09/18/2021 1121   BILITOT  1.13 07/23/2016 1255       Impression and Plan: Brian Mays is a very pleasant 75 yo caucasian gentleman with metastatic low-grade neuroendocrine carcinoma with hepatic metastasis. Urgent referral placed with Dr. Sondra Come for eval for palliative radiation to T6 and T 12.  Chromogranin A pending.  Somatuline injection given today as planned.  Follow-up in 1 month.  He can contact our office with any questions or concerns.   Lottie Dawson, NP 11/18/202212:19 PM

## 2021-09-21 ENCOUNTER — Telehealth: Payer: Self-pay | Admitting: *Deleted

## 2021-09-21 LAB — CHROMOGRANIN A: Chromogranin A (ng/mL): 113.9 ng/mL — ABNORMAL HIGH (ref 0.0–101.8)

## 2021-09-21 NOTE — Telephone Encounter (Signed)
Per 09/18/21 los - called and lvm of upcoming appointments - requested call back to confirm

## 2021-09-22 NOTE — Progress Notes (Signed)
Histology and Location of Primary Cancer: metastatic neuroendocrine tumor to liver  Sites of Visceral and Bony Metastatic Disease: liver and T6  Location(s) of Symptomatic Metastases: He has occasional twinges of lower back pain but this is described as mild and tolerable.  Past/Anticipated chemotherapy by medical oncology, if any: Dr Ennever Past Therapy: S/p yttrium-90 intrahepatic therapy - November 2016 Lutathera (Lu 177) injection on 02/01/2018 - s/p cycle 4   Current Therapy:        Somatuline 120 mg monthly IV Iron as indicated   Pain on a scale of 0-10 is:  no pain at present     If Spine Met(s), symptoms, if any, include: Bowel/Bladder retention or incontinence (please describe): denies Numbness or weakness in extremities (please describe): denies Current Decadron regimen, if applicable: none  Ambulatory status? Walker? Wheelchair?: Ambulatory  SAFETY ISSUES: Prior radiation? Radioative seeds to liver Pacemaker/ICD? no Possible current pregnancy? no Is the patient on methotrexate? no  Current Complaints / other details:  none  Vitals:   09/23/21 0922  BP: 122/68  Pulse: 66  Resp: 20  Temp: (!) 97.5 F (36.4 C)  SpO2: 99%  Weight: 204 lb 12.8 oz (92.9 kg)  Height: 5' 10.5" (1.791 m)     

## 2021-09-22 NOTE — Progress Notes (Signed)
Radiation Oncology         [phone] ________________________________  Initial Outpatient Consultation  Name: Brian Mays MRN: [id number]  Date: 09/23/2021  DOB: [birth year]  QJ:JHERDEY, Gay Filler, MD  Volanda Napoleon, MD   REFERRING PHYSICIAN: Volanda Napoleon, MD  DIAGNOSIS: The primary encounter diagnosis was Metastatic malignant neuroendocrine tumor to liver [facility]). 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  Metastatic neuroendocrine tumor to liver with new T6 bony metastasis  HISTORY OF PRESENT ILLNESS::Brian Mays is a 75 y.o. male who is accompanied by no one-. he is seen as a courtesy of Dr. Marin Olp for an opinion concerning radiation therapy as part of management for his recently diagnosed T6 metastasis from neuroendocrine tumor primary.  The patient has been followed by Dr. Marin Olp since 2014 in regards to his neuroendocrine tumor. Due to lengthy cancer history, the patient's HPI is detailed through imaging below. (The patient as maintained follow up with Dr. Marin Olp over the past 8 years).   Imaging since 2021 pertinent to the patient's principle Dx is as follows:  --PET On 02/27/20 demonstrated no evidence of well differentiated neuroendocrine tumor progression. Multifocal hepatic metastasis, nodal metastasis, and small skeletal metastasis at T12 appeared stable in size and radiotracer activity, with no new lesions appreciated in the liver as well.  --CT of the abdomen and pelvis on 09/02/20 revealed no significant change in the size and number of liver lesions. However, a slight increase in size of partially calcified mesenteric mass was seen. No CT correlate was appreciated to correspond with the precious neuroendocrine tumor and avid lesion noted within the T12 vertebra on prior exam.  --PET on 01/09/21 demonstrated the well differentiated metastatic neuroendocrine tumor to appear  stable, with no evidence of disease progression seen. The multiple hepatic lesions were also seen as stable when compared to PET in 2021. Small T12 skeletal metastasis appeared stable as well. --PET on 08/19/21 showed a single focus of uptake at the T6 vertebral body consistent with further neuroendocrine tumor skeletal metastasis. Stable mild uptake at the T12 vertebral body was appreciated as well. Hepatic metastasis and the central mesenteric mass were seen as stable as well. --MRI of the thoracic spine on 09/14/21 demonstrated the sub-centimeter enhancing lesions at T6 and T12 as consistent with metastases. No pathologic fracture or significant degenerative changes were seen otherwise.   Past Therapy: S/p yttrium-90 intrahepatic therapy - November 2016 Lutathera (Lu 177) injection on 02/01/2018 - s/p cycle 4   Current Therapy:        Somatuline 120 mg monthly IV Iron as indicated   PREVIOUS RADIATION THERAPY: No  PAST MEDICAL HISTORY:  Past Medical History:  Diagnosis Date   Anemia    in past   Colon polyps    Diverticulosis    Gallstones    GERD (gastroesophageal reflux disease)    Heart attack ([place])    mild, Spring 2017   Heart murmur    Hemorrhoid    Hiatal hernia    HTN (hypertension)    Hypothyroidism    Iron deficiency anemia due to chronic blood loss 03/29/2019   Iron malabsorption 03/29/2019   Metastatic carcinoma ([place]) 2010   Dr Jonette Eva   Pancreatitis 1998   chronic    PAST SURGICAL HISTORY: Past Surgical History:  Procedure Laterality Date   APPENDECTOMY  1962   CARDIAC CATHETERIZATION N/A 02/10/2016   Procedure: Left Heart Cath and  Coronary Angiography;  Surgeon: Adrian Prows, MD;  Location: [place] [facility];  Service: Cardiovascular;  Laterality: N/A;   CARDIAC CATHETERIZATION  02/10/2016   Procedure: Coronary/Graft Atherectomy;  Surgeon: Adrian Prows, MD;  Location: [place] [facility];  Service: Cardiovascular;;   CARDIAC CATHETERIZATION  02/10/2016   Procedure:  Coronary Stent Intervention;  Surgeon: Adrian Prows, MD;  Location: [place] [facility];  Service: Cardiovascular;;   CATARACT EXTRACTION W/ INTRAOCULAR LENS  IMPLANT, BILATERAL Bilateral    IR GENERIC HISTORICAL  12/30/2015   IR RADIOLOGIST EVAL & MGMT 12/30/2015 Aletta Edouard, MD [facility] INTER[facility] RAD   IR GENERIC HISTORICAL  11/09/2016   IR RADIOLOGIST EVAL & MGMT 11/09/2016 Aletta Edouard, MD [facility] INTERV RAD   IR RADIOLOGIST EVAL & MGMT  03/01/2017   IR RADIOLOGIST EVAL & MGMT  06/29/2017   LAPAROSCOPIC CHOLECYSTECTOMY  1999   LIVER BIOPSY  2010   RADIOACTIVE SEED IMPLANT  X 3   "to my liver"   TUMOR EXCISION  01/2009   Carcinoil Resection    TUMOR REMOVAL     from small intestine    FAMILY HISTORY:  Family History  Problem Relation Age of Onset   Kidney disease Mother    Hyperlipidemia Mother    Hypertension Mother    COPD Father    Ulcerative colitis Daughter     SOCIAL HISTORY:  Social History   Tobacco Use   Smoking status: Former    Packs/day: 1.50    Years: 35.00    Pack years: 52.50    Types: Cigarettes    Start date: 09/25/1959    Quit date: 12/21/1997    Years since quitting: 23.7   Smokeless tobacco: Never  Vaping Use   Vaping Use: Never used  Substance Use Topics   Alcohol use: Yes    Alcohol/week: 3.0 - 4.0 standard drinks    Types: 3 - 4 Cans of beer per week    Comment: 4 beers weekly    Drug use: No    ALLERGIES:  Allergies  Allergen Reactions   Iohexol Hives     Code: HIVES, Desc: PER [name] @ PRIMARY CARE, PT IS ALLERGIC TO CONTRAST DYE 10/02/08/RM  05/01/10...needs full premeds per our protocol w/ [facility]., Onset Date: [id number]     MEDICATIONS:  Current Outpatient Medications  Medication Sig Dispense Refill   aspirin EC 81 MG tablet Take 81 mg by mouth every morning.     atorvastatin (LIPITOR) 20 MG tablet TAKE 1 TABLET BY MOUTH DAILY AT 6 PM. 90 tablet 0   Cholecalciferol 1000 UNITS tablet Take 1,000 Units by mouth daily.     clopidogrel (PLAVIX)  75 MG tablet TAKE 1 TABLET BY MOUTH EVERY DAY 90 tablet 1   fluticasone (FLONASE) 50 MCG/ACT nasal spray Place 2 sprays into both nostrils daily as needed (sinuses).   11   hydrochlorothiazide (HYDRODIURIL) 25 MG tablet TAKE 1/2 TABLET BY MOUTH EVERY DAY 45 tablet 3   hydrocortisone (ANUSOL-HC) 25 MG suppository INSERT 1 SUPPOSITORY RECTALLY TWICE A DAY AS NEEDED FOR HEMORRHOIDS 30 suppository 0   ipratropium (ATROVENT) 0.03 % nasal spray Place 2 sprays into the nose 3 (three) times daily. 30 mL 6   Lanreotide Acetate (SOMATULINE DEPOT [place]) Inject 120 mcg into the skin every 28 (twenty-eight) days. Receives at Dr Antonieta Pert office     lansoprazole (PREVACID) 30 MG capsule Take 1 capsule (30 mg total) by mouth daily at 12 noon. 90 capsule 3   levothyroxine (  SYNTHROID) 50 MCG tablet TAKE 1 TABLET BY MOUTH DAILY BEFORE BREAKFAST 90 tablet 0   losartan (COZAAR) 100 MG tablet TAKE 1 TABLET BY MOUTH EVERY DAY 90 tablet 1   metoprolol tartrate (LOPRESSOR) 25 MG tablet TAKE 0.5 TABLETS BY MOUTH 2 TIMES DAILY. 90 tablet 0   nitroGLYCERIN (NITROSTAT) 0.4 MG SL tablet PLACE 1 TABLET UNDER THE TONGUE EVERY 5 (FIVE) MINUTES X 3 DOSES AS NEEDED FOR CHEST PAIN. 25 tablet 8   sodium chloride (OCEAN) 0.65 % SOLN nasal spray Place 1 spray into both nostrils as needed for congestion.      sucralfate (CARAFATE) 1 g tablet Take 1 tablet (1 g total) by mouth 4 (four) times daily -  with meals and at bedtime. 40 tablet 0   vitamin B-12 (CYANOCOBALAMIN) 1000 MCG tablet Take 1,000 mcg by mouth every evening.      No current facility-administered medications for this encounter.    REVIEW OF SYSTEMS:  A 10+ POINT REVIEW OF SYSTEMS WAS OBTAINED including neurology, dermatology, psychiatry, cardiac, respiratory, lymph, extremities, GI, GU, musculoskeletal, constitutional, reproductive, HEENT.  The patient reports some  upper back discomfort but not really any significant pain.  He denies any pain in the mid spine or pelvis  region.  He denies any headaches or visual difficulties.   PHYSICAL EXAM:  height is 5' 10.5" (1.791 m) and weight is 204 lb 12.8 oz (92.9 kg). His temperature is 97.5 F (36.4 C) (abnormal). His blood pressure is 122/68 and his pulse is 66. His respiration is 20 and oxygen saturation is 99%.   General: Alert and oriented, in no acute distress HEENT: Head is normocephalic. Extraocular movements are intact. Oropharynx is clear. Neck: Neck is supple, no palpable cervical or supraclavicular lymphadenopathy. Heart: Regular in rate and rhythm with no murmurs, rubs, or gallops. Chest: Clear to auscultation bilaterally, with no rhonchi, wheezes, or rales. Abdomen: Soft, nontender, nondistended, with no rigidity or guarding. Extremities: No cyanosis or edema. Lymphatics: see Neck Exam Skin: No concerning lesions. Musculoskeletal: symmetric strength and muscle tone throughout. Neurologic: Cranial nerves II through XII are grossly intact. No obvious focalities. Speech is fluent. Coordination is intact. Psychiatric: Judgment and insight are intact. Affect is appropriate.   ECOG = 1  0 - Asymptomatic (Fully active, able to carry on all predisease activities without restriction)  1 - Symptomatic but completely ambulatory (Restricted in physically strenuous activity but ambulatory and able to carry out work of a light or sedentary nature. For example, light housework, office work)  2 - Symptomatic, <50% in bed during the day (Ambulatory and capable of all self care but unable to carry out any work activities. Up and about more than 50% of waking hours)  3 - Symptomatic, >50% in bed, but not bedbound (Capable of only limited self-care, confined to bed or chair 50% or more of waking hours)  4 - Bedbound (Completely disabled. Cannot carry on any self-care. Totally confined to bed or chair)  5 - Death   Eustace Pen MM, Creech RH, Tormey DC, et al. [phone]). "Toxicity and response criteria of the [facility]". [place] Oncol. 5 (6): 649-55  LABORATORY DATA:  Lab Results  Component Value Date   WBC 8.1 09/18/2021   HGB 13.5 09/18/2021   HCT 40.7 09/18/2021   MCV 92.9 09/18/2021   PLT 170 09/18/2021   NEUTROABS 5.9 09/18/2021   Lab Results  Component Value Date   NA 137 09/18/2021   K 5.2 (  H) 09/18/2021   CL 98 09/18/2021   CO2 34 (H) 09/18/2021   GLUCOSE 106 (H) 09/18/2021   CREATININE 1.11 09/18/2021   CALCIUM 10.3 09/18/2021      RADIOGRAPHY: MR THORACIC SPINE W WO CONTRAST  Result Date: 09/15/2021 CLINICAL DATA:  History of metastatic neuroendocrine tumor with new bone lesion at T6 seen on recent PET-CT. EXAM: MRI THORACIC WITHOUT AND WITH CONTRAST TECHNIQUE: Multiplanar and multiecho pulse sequences of the thoracic spine were obtained without and with intravenous contrast. CONTRAST:  44mL GADAVIST GADOBUTROL 1 MMOL/ML IV SOLN COMPARISON:  PET-CT dated August 19, 2021. FINDINGS: Alignment:  Physiologic. Vertebrae: 8 mm STIR hyperintense, intensely enhancing lesion in the left T6 posterior elements (series 22, image 12). 8 mm faintly STIR hyperintense, enhancing lesion in the right posterior T6 vertebral body near the pedicle (series 22, image 7). Two 8 mm faintly STIR hyperintense, enhancing lesions in the T12 vertebral body (series 16, images 8 and 12). No fracture or evidence of discitis. T5 hemangioma. Cord:  Normal signal and morphology.  No intradural enhancement. Paraspinal and other soft tissues: Negative. Disc levels: No significant disc bulge or herniation.  No stenosis. IMPRESSION: 1. Subcentimeter enhancing lesions at T6 and T12, consistent with metastases. No pathologic fracture or significant degenerative changes. Electronically Signed   By: Titus Dubin M.D.   On: 09/15/2021 13:17      IMPRESSION:  Metastatic neuroendocrine tumor to liver with new T6 bony metastasis  Recent recent PET scan shows new single focus of activity in the T6  vertebral body.  The patient also has stable mild uptake in the T12 vertebral bodies.  These findings are consistent with mild progression of small-volume neuroendocrine tumor with skeletal metastasis.  The patient does have multiple intensely avid hepatic metastasis but these have been stable.  Today we reviewed the patient's most recent PET images as well as his recent thoracic MRI. The thoracic MRI confirms subcentimeter enhancing lesions at T6 and T12 consistent with osseous metastasis.  We discussed potential management options.  Given the low volume of disease, one option would be to hold off on any treatment and see what next set of imaging shows in terms of potential progression in this area.  The other option would be to proceed with radiation therapy directed at these 2 small osseous metastasis in the thoracic spine.  Given the location and size of these lesions the patient may be a candidate for stereotactic body radiation therapy (SBRT) directed at these lesions.  This type of radiation theoretically may give [place] a better effect considering that some neuro endocrine tumors may be relatively radioresistant.  I will discuss this with radiation and physics colleagues concerning this issue.   We discussed the natural history of Metastatic neuroendocrine tumor and general treatment, highlighting the role of radiotherapy in the management.  We discussed the available radiation techniques, and focused on the details of logistics and delivery.  We reviewed the anticipated acute and late sequelae associated with radiation in this setting.  The patient was encouraged to ask questions that I answered to the best of my ability.   After careful consideration the patient would like to proceed with radiation therapy at this time  PLAN: Patient will be set up for CT simulation once additional discussion is complete.  He will be set up for conventional short course of palliative radiation therapy versus SBRT/SRS  directed at his spinal metastasis.   60 minutes of total time was spent for this patient encounter,  including preparation, face-to-face counseling with the patient and coordination of care, physical exam, and documentation of the encounter.   ------------------------------------------------  Blair Promise, PhD, MD  This document serves as a record of services personally performed by Gery Pray, MD. It was created on his behalf by Roney Mans, a trained medical scribe. The creation of this record is based on the scribe's personal observations and the provider's statements to them. This document has been checked and approved by the attending provider.

## 2021-09-23 ENCOUNTER — Other Ambulatory Visit: Payer: Self-pay

## 2021-09-23 ENCOUNTER — Ambulatory Visit
Admission: RE | Admit: 2021-09-23 | Discharge: 2021-09-23 | Disposition: A | Discharge status: Home / Self Care | Payer: Medicare HMO | Source: Ambulatory Visit | Attending: Radiation Oncology | Admitting: Radiation Oncology

## 2021-09-23 ENCOUNTER — Encounter: Payer: Self-pay | Admitting: Radiation Oncology

## 2021-09-23 VITALS — BP 122/68 | HR 66 | Temp 97.5°F | Resp 20 | Ht 70.5 in | Wt 204.8 lb

## 2021-09-23 DIAGNOSIS — C7B8 Other secondary neuroendocrine tumors: Secondary | ICD-10-CM | POA: Insufficient documentation

## 2021-09-23 DIAGNOSIS — D500 Iron deficiency anemia secondary to blood loss (chronic): Secondary | ICD-10-CM | POA: Diagnosis not present

## 2021-09-23 DIAGNOSIS — E039 Hypothyroidism, unspecified: Secondary | ICD-10-CM | POA: Diagnosis not present

## 2021-09-23 DIAGNOSIS — C7951 Secondary malignant neoplasm of bone: Secondary | ICD-10-CM

## 2021-09-23 DIAGNOSIS — R011 Cardiac murmur, unspecified: Secondary | ICD-10-CM | POA: Insufficient documentation

## 2021-09-23 DIAGNOSIS — I10 Essential (primary) hypertension: Secondary | ICD-10-CM | POA: Insufficient documentation

## 2021-09-23 DIAGNOSIS — Z87891 Personal history of nicotine dependence: Secondary | ICD-10-CM | POA: Insufficient documentation

## 2021-09-23 DIAGNOSIS — Z7982 Long term (current) use of aspirin: Secondary | ICD-10-CM | POA: Diagnosis not present

## 2021-09-23 DIAGNOSIS — K219 Gastro-esophageal reflux disease without esophagitis: Secondary | ICD-10-CM | POA: Diagnosis not present

## 2021-09-23 DIAGNOSIS — Z86010 Personal history of colonic polyps: Secondary | ICD-10-CM | POA: Diagnosis not present

## 2021-09-23 DIAGNOSIS — Z79899 Other long term (current) drug therapy: Secondary | ICD-10-CM | POA: Diagnosis not present

## 2021-09-23 DIAGNOSIS — C7A8 Other malignant neuroendocrine tumors: Secondary | ICD-10-CM | POA: Insufficient documentation

## 2021-09-23 NOTE — Progress Notes (Signed)
See MD note for nursing evaluation. °

## 2021-10-01 ENCOUNTER — Telehealth: Payer: Self-pay | Admitting: Radiation Oncology

## 2021-10-02 ENCOUNTER — Encounter: Payer: Self-pay | Admitting: Cardiovascular Disease

## 2021-10-02 ENCOUNTER — Ambulatory Visit: Payer: Medicare HMO | Admitting: Cardiovascular Disease

## 2021-10-02 ENCOUNTER — Other Ambulatory Visit: Payer: Self-pay

## 2021-10-02 ENCOUNTER — Telehealth: Payer: Self-pay | Admitting: Radiation Oncology

## 2021-10-02 VITALS — BP 130/62 | HR 65 | Ht 70.5 in | Wt 203.4 lb

## 2021-10-02 DIAGNOSIS — E782 Mixed hyperlipidemia: Secondary | ICD-10-CM | POA: Diagnosis not present

## 2021-10-02 DIAGNOSIS — I2510 Atherosclerotic heart disease of native coronary artery without angina pectoris: Secondary | ICD-10-CM

## 2021-10-02 DIAGNOSIS — R739 Hyperglycemia, unspecified: Secondary | ICD-10-CM | POA: Diagnosis not present

## 2021-10-02 NOTE — Patient Instructions (Signed)
Medication Instructions:  Your physician recommends that you continue on your current medications as directed. Please refer to the Current Medication list given to you today.  *If you need a refill on your cardiac medications before your next appointment, please call your pharmacy*   Lab Work: NONE If you have labs (blood work) drawn today and your tests are completely normal, you will receive your results only by: [facility] (if you have MyChart) OR A paper copy in the mail If you have any lab test that is abnormal or we need to change your treatment, we will call you to review the results.   Testing/Procedures: NONE   Follow-Up: At [facility], you and your health needs are our priority.  As part of our continuing mission to provide you with exceptional heart care, we have created designated Provider Care Teams.  These Care Teams include your primary Cardiologist (physician) and Advanced Practice Providers (APPs -  Physician Assistants and Nurse Practitioners) who all work together to provide you with the care you need, when you need it.  We recommend signing up for the patient portal called "MyChart".  Sign up information is provided on this After Visit Summary.  MyChart is used to connect with patients for Virtual Visits (Telemedicine).  Patients are able to view lab/test results, encounter notes, upcoming appointments, etc.  Non-urgent messages can be sent to your provider as well.   To learn more about what you can do with MyChart, go to [email or web address].    Your next appointment:   1 year(s)  The format for your next appointment:   In Person  Provider:   Mertie Moores, MD  or Melina Copa, PA-C or Ermalinda Barrios, PA-C        :1}

## 2021-10-02 NOTE — Progress Notes (Signed)
Patient ID: Brian Mays; [id number]; Jan 24, 1946   Admit date: (Not on file) Date of Consult: 10/02/2021  Primary Care Provider: Darreld Mclean, MD Primary Cardiologist:  Brian Mays  Primary Electrophysiologist:     Problem list 1.  Coronary artery disease: Status post rotational atherectomy and stenting of the RCA 2.  Hypertension 3.  Hyperlipidemia 4.  Hypothyroidism  Patient Profile:   Brian Mays is a 75 y.o. male with a hx of HTN, , coronary artery disease with a recent episode of weakness and near syncope.  who is being seen today for the evaluation of HTN  at the request of Dr. Marin Mays. He sees Dr. Marin Mays for Carcinoid       Brian Mays is seen wife wife , Brian Mays today   Hx of coronary stenting in March 2017 .   Was started on Metoprolol, he has had intermittent weakness and dizziness/lightheadedness since that time.  He eats and drinks regularly.   Generally gets some regular exercise.  Has had some back issues recently.  He still goes to the line does some exercises that do not cause further back pain.  Dr. Marin Mays asked him to reduce his BP meds.   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  [facility] . Does have some lightheadedness after working out .  No CP or dyspnea while working out.   No synmptoms similar to his MI.    He stopped the Losartan last week He decreased the metoprolol to 12. 5 mg BID last week.   Still eats salty foods. Wife cooks from [employer] .  Eats some pretzels.   Has flushing related to Carcinoid.    Typically occurs if he drinks some beer .   Feb.  12, 2019: Doing well Gaining some weight .   Gave him the OK to walk  No CP or dyspnea   Aug. 27 ,2019:  Doing well.   Feeling better since he decreased his metoprolol in half.   September 15, 2018: Seen back today for follow-up of his coronary artery disease, hypertension,  hyperlipidemia. He increase his losartan 3 weeks ago. No CP  BP readings at home have been elevated.  Is not exercising   August 06, 2019:  Brian Mays is seen back today for follow-up of his coronary artery disease, hypertension, hyperlipidemia. BP has been well controlled at home .  No CP , is active , not doing exercise per se Tries to avoid salty foods.   April 22, 2020:  Brian Mays is seen today for follow up for his CAD, HTN, and hyperlipidemia. Several weeks ago he developed some light headedness.  Was helping a customer unload paint.  Was bending over - up and down repeatedly  Symptoms ~ c/w orthostasis   Went to the ER  Denies any chest pain .   Associated with some fatigue.  Had not skipped any meals,  May have been a bit volume depleted. ( had walked 5 miles the day before )  Labs looked ok .   Troponin were negative x 2.  ECG was normal   Dec. 3, 2021: Brian Mays is seen back today for follow up of his CAD, HTN, HLD Had some orthostasis prior to his previous visit  We reduced  his HCTZ at his last visit  Still eats salty foods regularly  Is not exercising  regularly .   Dec. 2, 2022 Brian Mays is seen back for follow up of his CAD, HTN, HLD No cp Is not as active as he used to be Is having to take care of his wife who had brain cancer surgery  She will need radiation  Has had his 2 covid vaccines and 2 boosters.  Has not had had covid  Wants to get back out on his mountain bike - ive encouraged him to ride / exercise regularly   Past Medical History:  Diagnosis Date   Anemia    in past   Colon polyps    Diverticulosis    Gallstones    GERD (gastroesophageal reflux disease)    Heart attack ([place])    mild, Spring 2017   Heart murmur    Hemorrhoid    Hiatal hernia    HTN (hypertension)    Hypothyroidism    Iron deficiency anemia due to chronic blood loss 03/29/2019   Iron malabsorption 03/29/2019   Metastatic carcinoma ([place]) 2010   Dr Brian Mays   Pancreatitis 1998    chronic    Past Surgical History:  Procedure Laterality Date   [place] N/A 02/10/2016   Procedure: Left Heart Cath and Coronary Angiography;  Surgeon: Brian Prows, MD;  Location: [facility];  Service: Cardiovascular;  Laterality: N/A;   CARDIAC CATHETERIZATION  02/10/2016   Procedure: Coronary/Graft Atherectomy;  Surgeon: Brian Prows, MD;  Location: [facility];  Service: Cardiovascular;;   CARDIAC CATHETERIZATION  02/10/2016   Procedure: Coronary Stent Intervention;  Surgeon: Brian Prows, MD;  Location: [facility];  Service: Cardiovascular;;   CATARACT EXTRACTION W/ INTRAOCULAR LENS  IMPLANT, BILATERAL Bilateral    IR GENERIC HISTORICAL  12/30/2015   IR RADIOLOGIST EVAL & MGMT 12/30/2015 Brian Edouard, MD [facility]   IR GENERIC HISTORICAL  11/09/2016   IR RADIOLOGIST EVAL & MGMT 11/09/2016 Brian Edouard, MD [facility]   IR RADIOLOGIST EVAL & MGMT  03/01/2017   IR RADIOLOGIST EVAL & MGMT  06/29/2017   LAPAROSCOPIC CHOLECYSTECTOMY  1999   LIVER BIOPSY  2010   RADIOACTIVE SEED IMPLANT  X 3   "to my liver"   TUMOR EXCISION  01/2009   Carcinoil Resection    TUMOR REMOVAL     from small intestine     Home Medications:  Prior to Admission medications   Medication Sig Start Date End Date Taking? Authorizing Provider  amLODipine (NORVASC) 5 MG tablet Take 5 mg by mouth daily. 03/16/17  Yes [provider]  aspirin EC 81 MG tablet Take 81 mg by mouth every morning.   Yes [provider]  atorvastatin (LIPITOR) 20 MG tablet Take 20 mg daily by mouth.   Yes [provider]  Cholecalciferol 1000 UNITS tablet Take 1,000 Units by mouth daily.     Yes [provider]  fluticasone (FLONASE) 50 MCG/ACT nasal spray Place 2 sprays into both nostrils daily as needed (sinuses).  01/06/15  Yes [provider]  hydrocortisone (ANUSOL-HC) 25 MG suppository INSERT 1 SUPPOSITORY RECTALLY TWICE A DAY AS  NEEDED FOR HEMORRHOIDS 02/07/15  Yes Brian Mays, Brian Humbles, NP  Lanreotide Acetate (SOMATULINE DEPOT [place]) Inject 120 mcg into the skin every 28 (twenty-eight) days. Receives at Dr Brian Mays office   Yes [provider]  lansoprazole (PREVACID) 30 MG capsule Take 1 capsule (30 mg total) by mouth daily at 12 noon. 11/19/16  Yes Copland, Gay Filler,  MD  levothyroxine (SYNTHROID, LEVOTHROID) 50 MCG tablet Take 1 tablet (50 mcg total) by mouth daily. 08/09/17  Yes Copland, Gay Filler, MD  naproxen (NAPROSYN) 500 MG tablet Take 1 tablet (500 mg total) by mouth 2 (two) times daily with a meal. 08/16/17  Yes Leandrew Koyanagi, MD  nitroGLYCERIN (NITROSTAT) 0.4 MG SL tablet Place 1 tablet (0.4 mg total) under the tongue every 5 (five) minutes x 3 doses as needed for chest pain. 02/11/16  Yes Brian Prows, MD  Omega-3 Fatty Acids (FISH OIL) 1000 MG CAPS Take 1 capsule by mouth daily. Reported on 12/30/2015   Yes [provider]  predniSONE (DELTASONE) 50 MG tablet Take 50mg  at 13 hours, 7 hours, and 1 hour before scan 02/18/17  Yes Brian Mays, Brian Humbles, NP  sodium chloride (OCEAN) 0.65 % SOLN nasal spray Place 1 spray into both nostrils as needed for congestion.    Yes [provider]  ticagrelor (BRILINTA) 90 MG TABS tablet Take 1 tablet (90 mg total) by mouth 2 (two) times daily. 02/11/16  Yes Brian Prows, MD  tiZANidine (ZANAFLEX) 4 MG tablet Take 1 tablet (4 mg total) by mouth every 6 (six) hours as needed for muscle spasms. 08/16/17  Yes Leandrew Koyanagi, MD  vitamin B-12 (CYANOCOBALAMIN) 1000 MCG tablet Take 1,000 mcg by mouth daily.   Yes [provider]  diphenhydrAMINE (BENADRYL) 50 MG tablet Take 1 tablet (50 mg total) by mouth once. Take 1 hour prior to scan. 02/18/17 02/18/17  Brian Mays, Brian Humbles, NP  metoprolol tartrate (LOPRESSOR) 25 MG tablet Take 0.5 tablets (12.5 mg total) 2 (two) times daily by mouth. 09/14/17   Wilmer Santillo, Wonda Cheng, MD     Allergies:    Allergies  Allergen Reactions    Iohexol Hives     Code: HIVES, Desc: PER [name] @ [facility], PT IS ALLERGIC TO CONTRAST DYE 10/02/08/RM  05/01/10...needs full premeds per our protocol w/ [facility]., Onset Date: [id number]     Social History:   Social History   Socioeconomic History   Marital status: Married    Spouse name: Not on file   Number of children: 1   Years of education: Not on file   Highest education level: Not on file  Occupational History   Occupation: Scientist, clinical (histocompatibility and immunogenetics): [employer]  Tobacco Use   Smoking status: Former    Packs/day: 1.50    Years: 35.00    Pack years: 52.50    Types: Cigarettes    Start date: 09/25/1959    Quit date: 12/21/1997    Years since quitting: 23.7   Smokeless tobacco: Never  Vaping Use   Vaping Use: Never used  Substance and Sexual Activity   Alcohol use: Yes    Alcohol/week: 3.0 - 4.0 standard drinks    Types: 3 - 4 Cans of beer per week    Comment: 4 beers weekly    Drug use: No   Sexual activity: Yes  Other Topics Concern   Not on file  Social History Narrative   Regular Exercise -  NO   Social Determinants of Health   Financial Resource Strain: Not on file  Food Insecurity: Not on file  Transportation Needs: Not on file  Physical Activity: Not on file  Stress: Not on file  Social Connections: Not on file  Intimate Partner Violence: Not on file    Family History:    Family History  Problem Relation Age of Onset  Kidney disease Mother    Hyperlipidemia Mother    Hypertension Mother    COPD Father    Ulcerative colitis Daughter      ROS:  Noted in current history, all other systems are negative.   Physical Exam: Blood pressure 130/62, pulse 65, height 5' 10.5" (1.791 m), weight 203 lb 6.4 oz (92.3 kg), SpO2 98 %.  GEN:  Well nourished, well developed in no acute distress HEENT: Normal NECK: No JVD; No carotid bruits LYMPHATICS: No lymphadenopathy CARDIAC: RRR  , soft systolic murmur  RESPIRATORY:  Clear to auscultation  without rales, wheezing or rhonchi  ABDOMEN: Soft, non-tender, non-distended MUSCULOSKELETAL:  No edema; No deformity  SKIN: Warm and dry NEUROLOGIC:  Alert and oriented x 3     EKG:       Relevant CV Studies:    Laboratory Data:  ChemistryNo results for input(s): NA, K, CL, CO2, GLUCOSE, BUN, CREATININE, CALCIUM, GFRNONAA, GFRAA, ANIONGAP in the last 168 hours.  No results for input(s): PROT, ALBUMIN, AST, ALT, ALKPHOS, BILITOT in the last 168 hours. HematologyNo results for input(s): WBC, RBC, HGB, HCT, MCV, MCH, MCHC, RDW, PLT in the last 168 hours. Cardiac EnzymesNo results for input(s): TROPONINI in the last 168 hours. No results for input(s): TROPIPOC in the last 168 hours.  BNPNo results for input(s): BNP, PROBNP in the last 168 hours.  DDimer No results for input(s): DDIMER in the last 168 hours.  Radiology/Studies:  No results found.  Assessment and Plan:     1.  Coronary artery disease:       2.  Near syncope:    3.  Hypertension:      BP looks good ,     4.  Hyperlipidemia:   labs from Dr. Lorelei Pont were reviewed LDL is 44.   5.  Systolic murmur,  has mild AI, trivial MR and trivial TR        For questions or updates, please contact [facility] Please consult [email or web address] for contact info under Cardiology/STEMI.   Signed, Mertie Moores, MD  10/02/2021 11:16 AM

## 2021-10-08 NOTE — Progress Notes (Signed)
Histology and Location of Primary Cancer: Neuroendocrine tumor with hepatic and bony metastases  Sites of Visceral and Bony Metastatic Disease: T6 and T12  MRI T Spine 09/14/2021: Subcentimeter enhancing lesions at T6 and T12, consistent with metastases. No pathologic fracture or significant degenerative changes.  PET 08/19/2021: Single new focus of uptake at the T6 vertebral body. Stable mild uptake at the T12 vertebral body. Findings consistent mild progression of small volume neuroendocrine tumor skeletal metastasis.  Multiple intensely radiotracer avid hepatic metastasis not increased in size or radiotracer activity compared to prior.  No increase in size or radiotracer activity of partially calcified central mesenteric mass.  Past/Anticipated chemotherapy by medical oncology, if any:  Dr. Marin Olp 08/21/2021 -[name] is a very pleasant 75 yo caucasian gentleman with metastatic low-grade neuroendocrine carcinoma with hepatic metastasis. -He seems to doing pretty well.  He really is not symptomatic.  I am quite happy about this. -Still not sure exactly what the PET scan is showing [place].  The MRI can certainly help.  If there is an issue, then I think radiation therapy would not be a bad idea. -We will see what the chromogranin A level is.  This does tend to fluctuate. -He will get his Somatuline today.  -Follow-up 10/19/2021, with next injection.   Pain on a scale of 0-10 is:  No pain at this time.   If Spine Met(s), symptoms, if any, include: Bowel/Bladder retention or incontinence (please describe): No major changes noted.  He reports mild constipation due to dietary changes. Numbness or weakness in extremities (please describe): none Current Decadron regimen, if applicable: n/a  Ambulatory status? Walker? Wheelchair?: Ambulatory  SAFETY ISSUES: Prior radiation? Intrahepatic therapy with Y90 09/2015 Pacemaker/ICD? No Possible current pregnancy? N/a Is the patient on methotrexate?  No  Current Complaints / other details:

## 2021-10-11 ENCOUNTER — Other Ambulatory Visit: Payer: Self-pay | Admitting: Family Medicine

## 2021-10-11 DIAGNOSIS — E039 Hypothyroidism, unspecified: Secondary | ICD-10-CM

## 2021-10-13 ENCOUNTER — Other Ambulatory Visit: Payer: Self-pay | Admitting: Radiation Therapy

## 2021-10-13 ENCOUNTER — Ambulatory Visit
Admission: RE | Admit: 2021-10-13 | Discharge: 2021-10-13 | Disposition: A | Discharge status: Home / Self Care | Payer: Medicare HMO | Source: Ambulatory Visit | Attending: Radiation Oncology | Admitting: Radiation Oncology

## 2021-10-13 ENCOUNTER — Encounter: Payer: Self-pay | Admitting: Radiation Oncology

## 2021-10-13 ENCOUNTER — Other Ambulatory Visit: Payer: Self-pay

## 2021-10-13 VITALS — Ht 70.5 in | Wt 203.0 lb

## 2021-10-13 DIAGNOSIS — C7951 Secondary malignant neoplasm of bone: Secondary | ICD-10-CM

## 2021-10-13 DIAGNOSIS — C7A8 Other malignant neuroendocrine tumors: Secondary | ICD-10-CM | POA: Diagnosis not present

## 2021-10-13 DIAGNOSIS — C7B8 Other secondary neuroendocrine tumors: Secondary | ICD-10-CM

## 2021-10-13 NOTE — Progress Notes (Signed)
Radiation Oncology         [phone] ________________________________  Initial Outpatient Consultation - Conducted via telephone due to current COVID-19 concerns for limiting patient exposure  I spoke with the patient to conduct this consult visit via telephone to spare the patient unnecessary potential exposure in the healthcare setting during the current COVID-19 pandemic. The patient was notified in advance and was offered a [place] meeting to allow for face to face communication but unfortunately reported that they did not have the appropriate resources/technology to support such a visit and instead preferred to proceed with a telephone consult.     Name: Brian Mays        MRN: [id number]  Date of Service: 10/13/2021 DOB: [birth year]  TZ:[id number], Gay Filler, MD  Volanda Napoleon, MD     REFERRING PHYSICIAN: Volanda Napoleon, MD   DIAGNOSIS: The encounter diagnosis was Secondary malignant neoplasm of bone [name]).   HISTORY OF PRESENT ILLNESS: Brian Mays is a 74 y.o. male seen at the request of Dr. Sondra Come for a diagnosis of metastatic neuroendocrine tumor. The patient was originally diagnosed with neuroendocrine tumor in 2014, originally in the small bowel.  He has had known metastatic disease and has received Y 90 intrahepatic therapy in November 2016, Lutathera injection x4 cycles in 2019 and remains on Somatuline under the care of Dr. Marin Olp.  Recent pet imaging showed a single focus of uptake in the T6 vertebral body concerning for further neuroendocrine tumor disease stable mild uptake in the T12 vertebral body was also appreciated and hepatic metastases and central mesenteric mass were also noted but stable.  An MRI of the thoracic spine on 09/14/2021 showed enhancing lesions at T6 and T12 consistent with metastatic disease but without pathologic fracture or compromise to his spinal canal or cord.  He is contacted by phone to discuss stereotactic radiosurgery.     PREVIOUS  RADIATION THERAPY: No  Lutathera (Lu 177) injection on 02/01/2018 - s/p cycle 4  PAST MEDICAL HISTORY:  Past Medical History:  Diagnosis Date   Anemia    in past   Colon polyps    Diverticulosis    Gallstones    GERD (gastroesophageal reflux disease)    Heart attack ([place])    mild, Spring 2017   Heart murmur    Hemorrhoid    Hiatal hernia    HTN (hypertension)    Hypothyroidism    Iron deficiency anemia due to chronic blood loss 03/29/2019   Iron malabsorption 03/29/2019   Metastatic carcinoma ([place]) 2010   Dr Jonette Eva   Neuroendocrine cancer [facility]) 08/2021   Pancreatitis 1998   chronic       PAST SURGICAL HISTORY: Past Surgical History:  Procedure Laterality Date   APPENDECTOMY  1962   CARDIAC CATHETERIZATION N/A 02/10/2016   Procedure: Left Heart Cath and Coronary Angiography;  Surgeon: Adrian Prows, MD;  Location: [place] [facility];  Service: Cardiovascular;  Laterality: N/A;   CARDIAC CATHETERIZATION  02/10/2016   Procedure: Coronary/Graft Atherectomy;  Surgeon: Adrian Prows, MD;  Location: [place] [facility];  Service: Cardiovascular;;   CARDIAC CATHETERIZATION  02/10/2016   Procedure: Coronary Stent Intervention;  Surgeon: Adrian Prows, MD;  Location: [place] [facility];  Service: Cardiovascular;;   CATARACT EXTRACTION W/ INTRAOCULAR LENS  IMPLANT, BILATERAL Bilateral    IR GENERIC HISTORICAL  12/30/2015   IR RADIOLOGIST EVAL & MGMT 12/30/2015 Aletta Edouard, MD [facility] INTERV RAD   IR GENERIC HISTORICAL  11/09/2016  IR RADIOLOGIST EVAL & MGMT 11/09/2016 Aletta Edouard, MD [facility] INTERV RAD   IR RADIOLOGIST EVAL & MGMT  03/01/2017   IR RADIOLOGIST EVAL & MGMT  06/29/2017   LAPAROSCOPIC CHOLECYSTECTOMY  1999   LIVER BIOPSY  2010   RADIOACTIVE SEED IMPLANT  X 3   "to my liver"   TUMOR EXCISION  01/2009   Carcinoil Resection    TUMOR REMOVAL     from small intestine     FAMILY HISTORY:  Family History  Problem Relation Age of Onset   Kidney disease Mother    Hyperlipidemia  Mother    Hypertension Mother    COPD Father    Ulcerative colitis Daughter      SOCIAL HISTORY:  reports that he quit smoking about 23 years ago. His smoking use included cigarettes. He started smoking about 62 years ago. He has a 52.50 pack-year smoking history. He has never used smokeless tobacco. He reports current alcohol use of about 3.0 - 4.0 standard drinks per week. He reports that he does not use drugs. The patient is married and lives in [place]. His wife is also a patient at the cancer center with Dr. Isidore Moos.    ALLERGIES: Iohexol   MEDICATIONS:  Current Outpatient Medications  Medication Sig Dispense Refill   aspirin EC 81 MG tablet Take 81 mg by mouth every morning.     atorvastatin (LIPITOR) 20 MG tablet TAKE 1 TABLET BY MOUTH DAILY AT 6 PM. 90 tablet 0   Cholecalciferol 1000 UNITS tablet Take 1,000 Units by mouth daily.     clopidogrel (PLAVIX) 75 MG tablet TAKE 1 TABLET BY MOUTH EVERY DAY 90 tablet 1   fluticasone (FLONASE) 50 MCG/ACT nasal spray Place 2 sprays into both nostrils daily as needed (sinuses).   11   hydrochlorothiazide (HYDRODIURIL) 25 MG tablet TAKE 1/2 TABLET BY MOUTH EVERY DAY 45 tablet 3   hydrocortisone (ANUSOL-HC) 25 MG suppository INSERT 1 SUPPOSITORY RECTALLY TWICE A DAY AS NEEDED FOR HEMORRHOIDS 30 suppository 0   ipratropium (ATROVENT) 0.03 % nasal spray Place 2 sprays into the nose 3 (three) times daily. 30 mL 6   Lanreotide Acetate (SOMATULINE DEPOT [place]) Inject 120 mcg into the skin every 28 (twenty-eight) days. Receives at Dr Antonieta Pert office     lansoprazole (PREVACID) 30 MG capsule Take 1 capsule (30 mg total) by mouth daily at 12 noon. 90 capsule 3   levothyroxine (SYNTHROID) 50 MCG tablet TAKE 1 TABLET BY MOUTH EVERY DAY BEFORE BREAKFAST 90 tablet 0   losartan (COZAAR) 100 MG tablet TAKE 1 TABLET BY MOUTH EVERY DAY 90 tablet 1   metoprolol tartrate (LOPRESSOR) 25 MG tablet TAKE 0.5 TABLETS BY MOUTH 2 TIMES DAILY. 90 tablet 0    nitroGLYCERIN (NITROSTAT) 0.4 MG SL tablet PLACE 1 TABLET UNDER THE TONGUE EVERY 5 (FIVE) MINUTES X 3 DOSES AS NEEDED FOR CHEST PAIN. 25 tablet 8   sucralfate (CARAFATE) 1 g tablet Take 1 tablet (1 g total) by mouth 4 (four) times daily -  with meals and at bedtime. 40 tablet 0   vitamin B-12 (CYANOCOBALAMIN) 1000 MCG tablet Take 1,000 mcg by mouth every evening.      No current facility-administered medications for this encounter.     REVIEW OF SYSTEMS: On review of systems, the patient reports that he is doing pretty well. He is surprised so well actually. He has had previously dealt with low back pain from a slipped disc at L4, but no surgeries in the  past. He reports no pain at the low back at this time, and denies any shooting or radiating pains in the back but occasionally has some soreness between his shoulder blades. No numbness or tingling, or difficulty with weakness or loss of sensation is noted. No other complaints are verbalized.      PHYSICAL EXAM:  Wt Readings from Last 3 Encounters:  10/13/21 203 lb (92.1 kg)  10/02/21 203 lb 6.4 oz (92.3 kg)  09/23/21 204 lb 12.8 oz (92.9 kg)   Pain Assessment Pain Score: 0-No pain/10    ECOG = 0  0 - Asymptomatic (Fully active, able to carry on all predisease activities without restriction)  1 - Symptomatic but completely ambulatory (Restricted in physically strenuous activity but ambulatory and able to carry out work of a light or sedentary nature. For example, light housework, office work)  2 - Symptomatic, <50% in bed during the day (Ambulatory and capable of all self care but unable to carry out any work activities. Up and about more than 50% of waking hours)  3 - Symptomatic, >50% in bed, but not bedbound (Capable of only limited self-care, confined to bed or chair 50% or more of waking hours)  4 - Bedbound (Completely disabled. Cannot carry on any self-care. Totally confined to bed or chair)  5 - Death   Eustace Pen MM, Creech RH,  Tormey DC, et al. [phone]). "Toxicity and response criteria of the [facility]". [place] Oncol. 5 (6): 649-55    LABORATORY DATA:  Lab Results  Component Value Date   WBC 8.1 09/18/2021   HGB 13.5 09/18/2021   HCT 40.7 09/18/2021   MCV 92.9 09/18/2021   PLT 170 09/18/2021   Lab Results  Component Value Date   NA 137 09/18/2021   K 5.2 (H) 09/18/2021   CL 98 09/18/2021   CO2 34 (H) 09/18/2021   Lab Results  Component Value Date   ALT 16 09/18/2021   AST 24 09/18/2021   ALKPHOS 55 09/18/2021   BILITOT 0.8 09/18/2021      RADIOGRAPHY: MR THORACIC SPINE W WO CONTRAST  Result Date: 09/15/2021 CLINICAL DATA:  History of metastatic neuroendocrine tumor with new bone lesion at T6 seen on recent PET-CT. EXAM: MRI THORACIC WITHOUT AND WITH CONTRAST TECHNIQUE: Multiplanar and multiecho pulse sequences of the thoracic spine were obtained without and with intravenous contrast. CONTRAST:  75mL GADAVIST GADOBUTROL 1 MMOL/ML IV SOLN COMPARISON:  PET-CT dated August 19, 2021. FINDINGS: Alignment:  Physiologic. Vertebrae: 8 mm STIR hyperintense, intensely enhancing lesion in the left T6 posterior elements (series 22, image 12). 8 mm faintly STIR hyperintense, enhancing lesion in the right posterior T6 vertebral body near the pedicle (series 22, image 7). Two 8 mm faintly STIR hyperintense, enhancing lesions in the T12 vertebral body (series 16, images 8 and 12). No fracture or evidence of discitis. T5 hemangioma. Cord:  Normal signal and morphology.  No intradural enhancement. Paraspinal and other soft tissues: Negative. Disc levels: No significant disc bulge or herniation.  No stenosis. IMPRESSION: 1. Subcentimeter enhancing lesions at T6 and T12, consistent with metastases. No pathologic fracture or significant degenerative changes. Electronically Signed   By: Titus Dubin M.D.   On: 09/15/2021 13:17       IMPRESSION/PLAN: 1. Metastatic Neuroendocrine Tumor to  Thoracic Spine levels T6 and T12. Dr. Lisbeth Renshaw discusses the pathology findings and reviews the nature of metastatic neuroendocrine disease. Dr. Lisbeth Renshaw offers definitive radiotherapy, and he would be a good  candidate for stereotactic radiosurgery. Dr. Lisbeth Renshaw discusses the need for 3T MRI scan for planning and treatment purposes. He is in agreement with this and is also aware of the role for neurosurgery to participate in the treatment planning and delivery of the radiation. We discussed the risks, benefits, short, and long term effects of radiotherapy, as well as the curative intent, and the patient is interested in proceeding. Dr. Lisbeth Renshaw discusses the delivery and logistics of radiotherapy and anticipates a course of 1 fraction of radiotherapy to T6 and T12. He will be contacted by our navigator to coordinate scheduling of MRI, Neurosurgical consultation, simulation, and treatment. He will sign written consent for treatment at the time of simulation. He will otherwise continue with his Somatuline with Dr. Arelia Sneddon.   Given current concerns for patient exposure during the COVID-19 pandemic, this encounter was conducted via telephone.  The patient has provided two factor identification and has given verbal consent for this type of encounter and has been advised to only accept a meeting of this type in a secure network environment. The time spent during this encounter was 45 minutes including preparation, discussion, and coordination of the patient's care. The attendants for this meeting include Blenda Nicely, RN, Dr. Lisbeth Renshaw, Hayden Pedro  and [name].  During the encounter,  Blenda Nicely, RN, Dr. Lisbeth Renshaw, and Hayden Pedro were located at [facility] Radiation Oncology Department.  [name] was located at home.   The above documentation reflects my direct findings during this shared patient visit. Please see the separate note by Dr. Lisbeth Renshaw on this date for the remainder of the  patient's plan of care.    Carola Rhine, [facility]   **Disclaimer: This note was dictated with voice recognition software. Similar sounding words can inadvertently be transcribed and this note may contain transcription errors which may not have been corrected upon publication of note.**

## 2021-10-19 ENCOUNTER — Ambulatory Visit: Payer: Medicare HMO | Admitting: Family

## 2021-10-19 ENCOUNTER — Ambulatory Visit: Payer: Medicare HMO

## 2021-10-19 ENCOUNTER — Other Ambulatory Visit: Payer: Medicare HMO

## 2021-10-20 ENCOUNTER — Other Ambulatory Visit: Payer: Self-pay | Admitting: Radiation Therapy

## 2021-10-20 DIAGNOSIS — C7951 Secondary malignant neoplasm of bone: Secondary | ICD-10-CM

## 2021-10-23 ENCOUNTER — Telehealth: Payer: Self-pay | Admitting: Hematology & Oncology

## 2021-10-23 NOTE — Telephone Encounter (Signed)
Called patient per 10/23/21 sch msg - no answer. Rescheduled appt and left message for patient with new appt date and time. Rescheduled appt due to provider leaving early on 12/30

## 2021-10-29 ENCOUNTER — Other Ambulatory Visit: Payer: Medicare HMO

## 2021-10-29 ENCOUNTER — Ambulatory Visit (HOSPITAL_COMMUNITY)
Admission: RE | Admit: 2021-10-29 | Discharge: 2021-10-29 | Disposition: A | Discharge status: Home / Self Care | Payer: Medicare HMO | Source: Ambulatory Visit | Attending: Radiation Oncology | Admitting: Radiation Oncology

## 2021-10-29 ENCOUNTER — Other Ambulatory Visit: Payer: Self-pay

## 2021-10-29 DIAGNOSIS — C7951 Secondary malignant neoplasm of bone: Secondary | ICD-10-CM | POA: Diagnosis not present

## 2021-10-29 DIAGNOSIS — Z01818 Encounter for other preprocedural examination: Secondary | ICD-10-CM | POA: Diagnosis not present

## 2021-10-29 IMAGING — MR MR THORACIC SPINE WO/W CM
5 of 7 series · 21 of 48 positions shown · IV contrast (gadavist)
Comparison: [DATE]

CLINICAL DATA: Pre treatment planning.

EXAM:
MRI THORACIC WITHOUT AND WITH CONTRAST
TECHNIQUE: Multiplanar and multiecho pulse sequences of the thoracic spine were
obtained without and with intravenous contrast.
CONTRAST:  9mL GADAVIST GADOBUTROL 1 MMOL/ML IV SOLN

[Series 2: T1 · sagittal · 3.0mm · 0.90mm/px · 3 of 12 slices shown (1 of 3)]
[im 1/12]
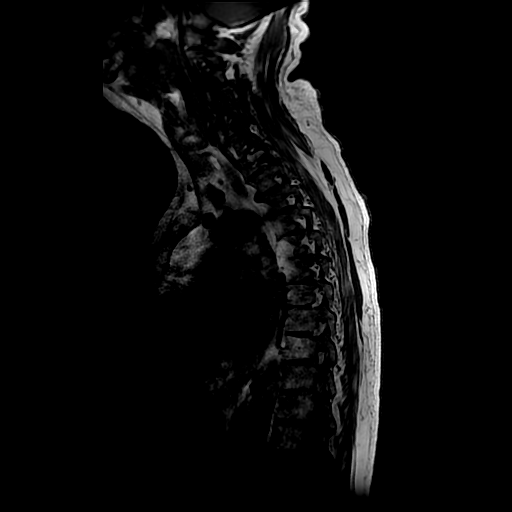
[im 6/12]
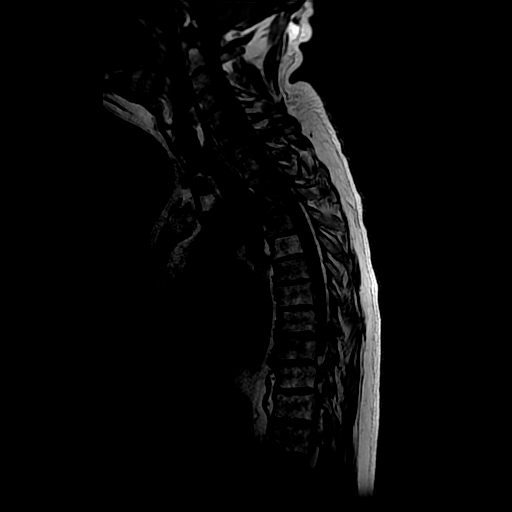
[im 12/12]
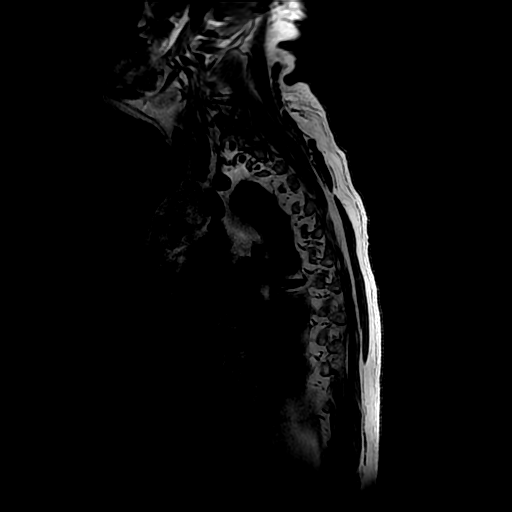

[Series 4: T1 · sagittal · 3.0mm · 1.25mm/px · 2 of 14 slices shown (2 of 3)]
[im 1/14]
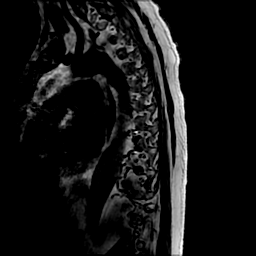
[im 14/14]
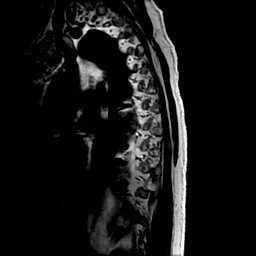

[Series 6: T2 · axial · 2.0mm · 0.39mm/px · z∈[-252,-10]mm · 9 of 88 slices shown]
[im 1/88]
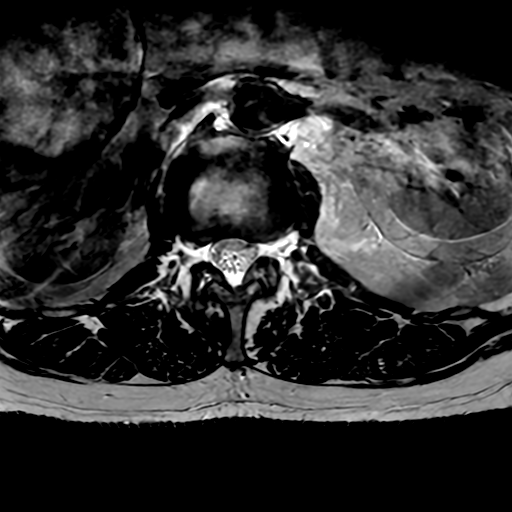
[im 15/88]
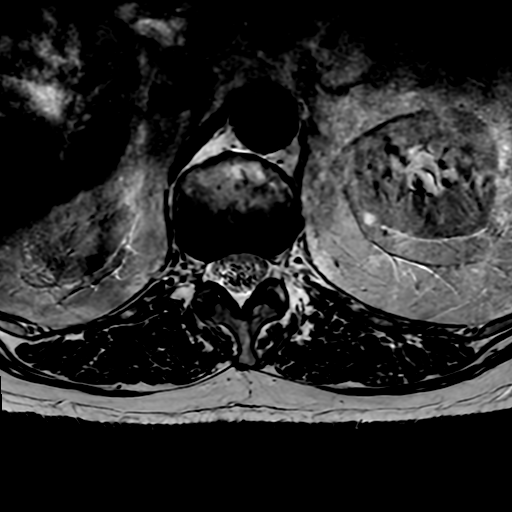
[im 30/88]
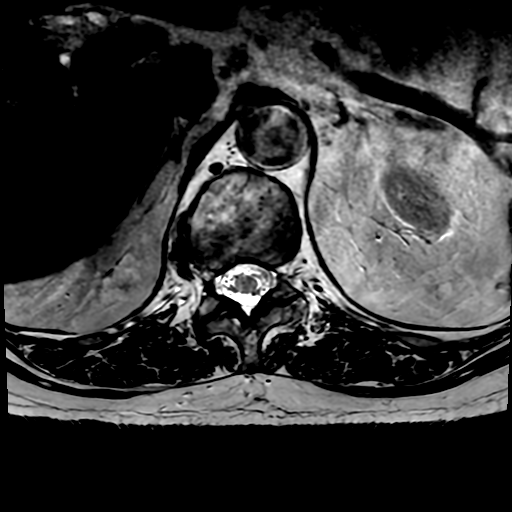
[im 37/88]
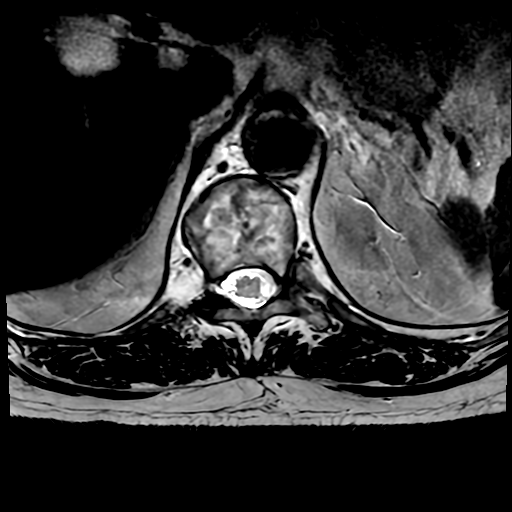
[im 44/88]
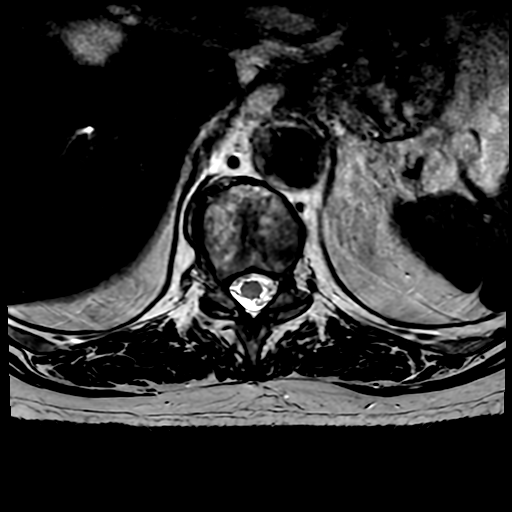
[im 51/88]
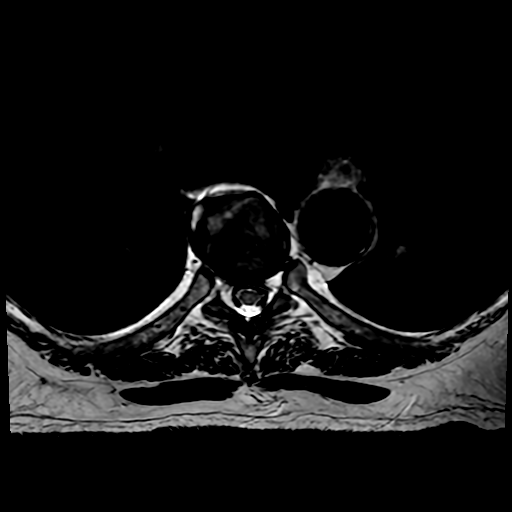
[im 59/88]
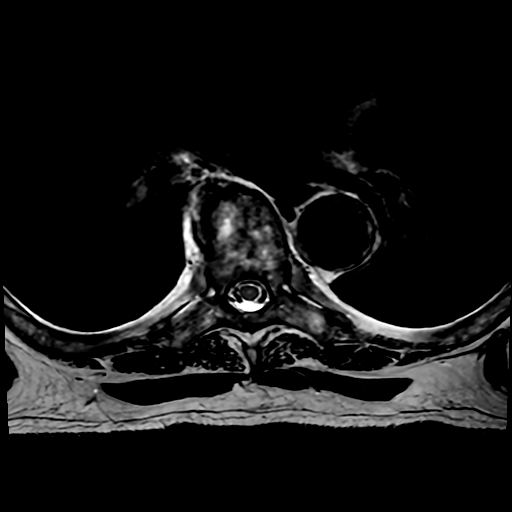
[im 73/88]
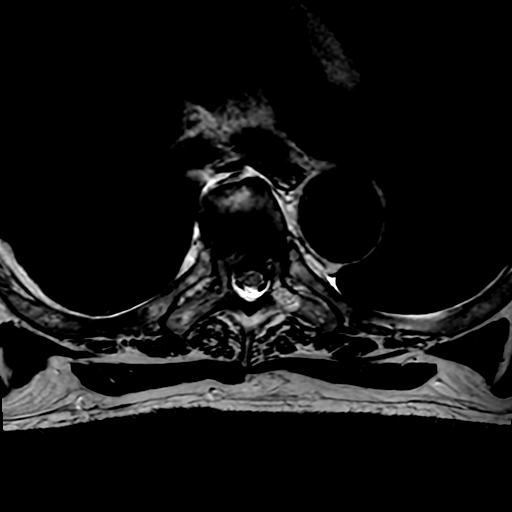
[im 88/88]
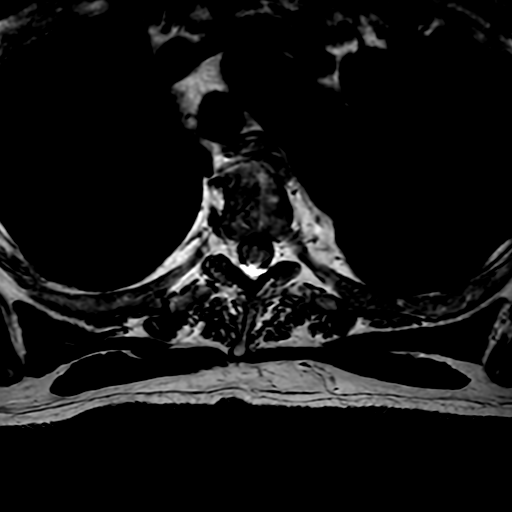

[Series 7: T1 · axial · non-contrast · 2.0mm · 0.39mm/px · z∈[-252,-84]mm · 5 of 88 slices shown (3 of 3)]
[im 1/88]
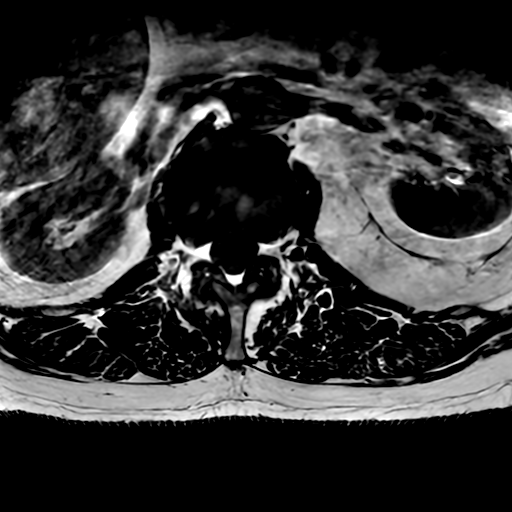
[im 15/88]
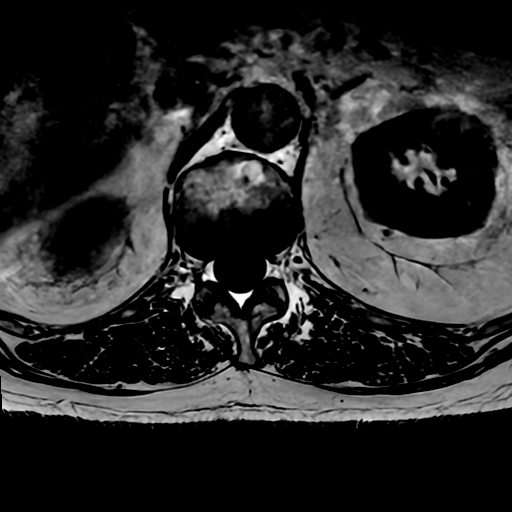
[im 30/88]
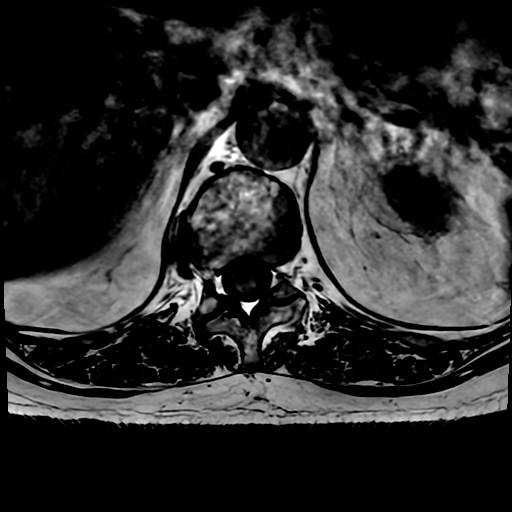
[im 37/88]
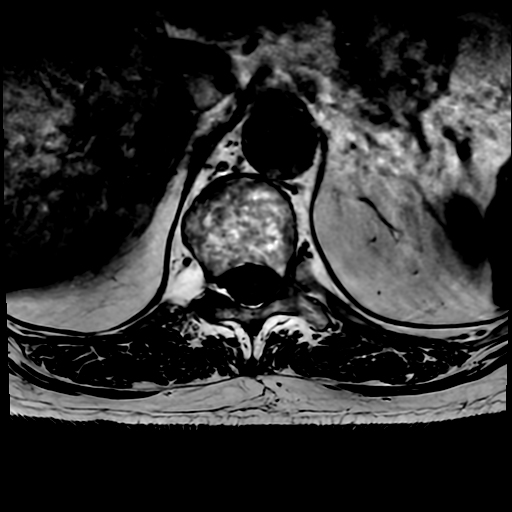
[im 51/88]
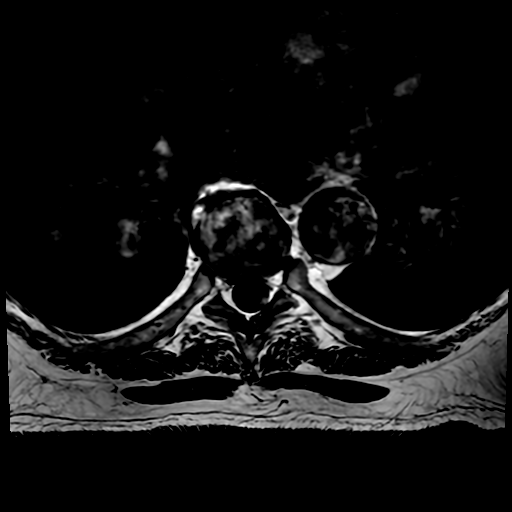

[Series 8: T2 post-contrast · sagittal · 3.0mm · 0.62mm/px · 2 of 14 slices shown]
[im 1/14]
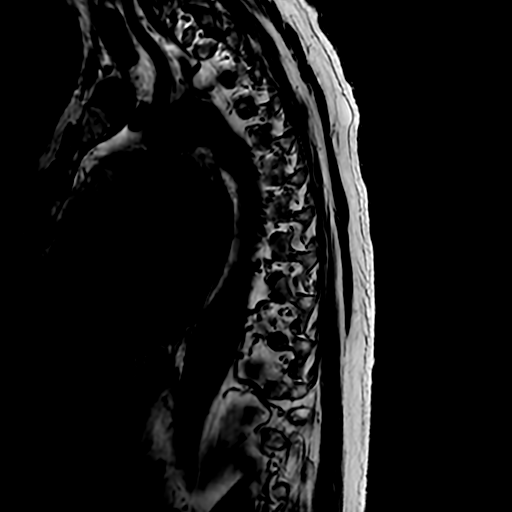
[im 14/14]
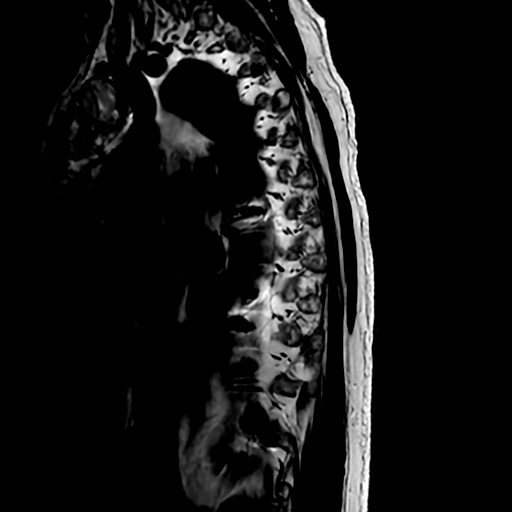

[21 of 48 positions shown; findings below may reference images not displayed]

FINDINGS: Alignment:  Normal

Vertebrae: T5 hemangioma. Enhancing lesions in the T6 left posterior
elements and in 2 locations at T12 as previously demonstrated.
Thin-section no angle axial slices are taken to these levels for
treatment localization. No evidence of new lesion.

Cord:  No evidence of intrathecal metastasis.  No acute fracture

Paraspinal and other soft tissues: Right liver mass, reference prior
body imaging.

Disc levels:

Ordinary degenerative changes without degenerative impingement.
IMPRESSION: Treatment planning MRI for T6 and T12 small metastases. No interval
or acute finding.

## 2021-10-29 MED ORDER — GADOBUTROL 1 MMOL/ML IV SOLN
9.0000 mL | Freq: Once | INTRAVENOUS | Status: AC | PRN
  Administered 2021-10-29: 13:00:00 9 mL via INTRAVENOUS

## 2021-10-30 ENCOUNTER — Inpatient Hospital Stay: Payer: Medicare HMO

## 2021-10-30 ENCOUNTER — Inpatient Hospital Stay (HOSPITAL_BASED_OUTPATIENT_CLINIC_OR_DEPARTMENT_OTHER): Payer: Medicare HMO | Admitting: Hematology & Oncology

## 2021-10-30 ENCOUNTER — Ambulatory Visit: Payer: Medicare HMO | Admitting: Hematology & Oncology

## 2021-10-30 ENCOUNTER — Other Ambulatory Visit: Payer: Self-pay

## 2021-10-30 ENCOUNTER — Ambulatory Visit: Payer: Medicare HMO

## 2021-10-30 ENCOUNTER — Telehealth: Payer: Self-pay | Admitting: Hematology & Oncology

## 2021-10-30 ENCOUNTER — Inpatient Hospital Stay: Payer: Medicare HMO | Attending: Hematology & Oncology

## 2021-10-30 ENCOUNTER — Encounter: Payer: Self-pay | Admitting: Hematology & Oncology

## 2021-10-30 VITALS — BP 153/64 | HR 62 | Temp 98.7°F | Resp 18 | Wt 203.0 lb

## 2021-10-30 DIAGNOSIS — D500 Iron deficiency anemia secondary to blood loss (chronic): Secondary | ICD-10-CM

## 2021-10-30 DIAGNOSIS — C7B8 Other secondary neuroendocrine tumors: Secondary | ICD-10-CM

## 2021-10-30 DIAGNOSIS — C7A8 Other malignant neuroendocrine tumors: Secondary | ICD-10-CM | POA: Insufficient documentation

## 2021-10-30 LAB — CBC WITH DIFFERENTIAL (CANCER CENTER ONLY)
Abs Immature Granulocytes: 0.02 10*3/uL (ref 0.00–0.07)
Basophils Absolute: 0.1 10*3/uL (ref 0.0–0.1)
Basophils Relative: 1 %
Eosinophils Absolute: 0.4 10*3/uL (ref 0.0–0.5)
Eosinophils Relative: 6 %
HCT: 39.2 % (ref 39.0–52.0)
Hemoglobin: 12.8 g/dL — ABNORMAL LOW (ref 13.0–17.0)
Immature Granulocytes: 0 %
Lymphocytes Relative: 17 %
Lymphs Abs: 1.2 10*3/uL (ref 0.7–4.0)
MCH: 30.6 pg (ref 26.0–34.0)
MCHC: 32.7 g/dL (ref 30.0–36.0)
MCV: 93.8 fL (ref 80.0–100.0)
Monocytes Absolute: 0.5 10*3/uL (ref 0.1–1.0)
Monocytes Relative: 7 %
Neutro Abs: 4.7 10*3/uL (ref 1.7–7.7)
Neutrophils Relative %: 69 %
Platelet Count: 148 10*3/uL — ABNORMAL LOW (ref 150–400)
RBC: 4.18 MIL/uL — ABNORMAL LOW (ref 4.22–5.81)
RDW: 13.6 % (ref 11.5–15.5)
WBC Count: 6.9 10*3/uL (ref 4.0–10.5)
nRBC: 0 % (ref 0.0–0.2)

## 2021-10-30 LAB — CMP (CANCER CENTER ONLY)
ALT: 15 U/L (ref 0–44)
AST: 23 U/L (ref 15–41)
Albumin: 3.9 g/dL (ref 3.5–5.0)
Alkaline Phosphatase: 51 U/L (ref 38–126)
Anion gap: 6 (ref 5–15)
BUN: 14 mg/dL (ref 8–23)
CO2: 32 mmol/L (ref 22–32)
Calcium: 9.5 mg/dL (ref 8.9–10.3)
Chloride: 102 mmol/L (ref 98–111)
Creatinine: 0.98 mg/dL (ref 0.61–1.24)
GFR, Estimated: >60 mL/min (ref >60)
Glucose, Bld: 97 mg/dL (ref 70–99)
Potassium: 4.3 mmol/L (ref 3.5–5.1)
Sodium: 140 mmol/L (ref 135–145)
Total Bilirubin: 0.7 mg/dL (ref 0.3–1.2)
Total Protein: 7.2 g/dL (ref 6.5–8.1)

## 2021-10-30 LAB — LACTATE DEHYDROGENASE: LDH: 189 U/L (ref 98–192)

## 2021-10-30 MED ORDER — LANREOTIDE ACETATE 120 MG/0.5ML ~~LOC~~ SOLN
120.0000 mg | Freq: Once | SUBCUTANEOUS | Status: AC
  Administered 2021-10-30: 11:00:00 120 mg via SUBCUTANEOUS
  Filled 2021-10-30: qty 120

## 2021-10-30 MED ORDER — HYDROCHLOROTHIAZIDE 25 MG PO TABS
12.5000 mg | ORAL_TABLET | Freq: Every day | ORAL | 3 refills | Status: DC
Start: 2021-10-30 — End: 2022-09-08

## 2021-10-30 NOTE — Telephone Encounter (Signed)
Attempted to contact patient to schedule, per los 12/30. Left voicemail for patient to call back to proceed with scheduling.

## 2021-10-30 NOTE — Patient Instructions (Signed)
Lanreotide injection °What is this medication? °LANREOTIDE (lan REE oh tide) is used to reduce blood levels of growth hormone in patients with a condition called acromegaly. It also works to slow or stop tumor growth in patients with neuroendocrine tumors and treat carcinoid syndrome. °This medicine may be used for other purposes; ask your health care provider or pharmacist if you have questions. °COMMON BRAND NAME(S): Somatuline [place] °What should I tell my care team before I take this medication? °They need to know if you have any of these conditions: °diabetes °gallbladder disease °heart disease °kidney disease °liver disease °thyroid disease °an unusual or allergic reaction to lanreotide, other medicines, foods, dyes, or preservatives °pregnant or trying to get pregnant °breast-feeding °How should I use this medication? °This medicine is for injection under the skin. It is given by a health care professional in a hospital or clinic setting. °Contact your pediatrician or health care professional regarding the use of this medicine in children. Special care may be needed. °Overdosage: If you think you have taken too much of this medicine contact a poison control center or emergency room at once. °NOTE: This medicine is only for you. Do not share this medicine with others. °What if I miss a dose? °It is important not to miss your dose. Call your doctor or health care professional if you are unable to keep an appointment. °What may interact with this medication? °This medicine may interact with the following medications: °bromocriptine °cyclosporine °certain medicines for blood pressure, heart disease, irregular heart beat °certain medicines for diabetes °quinidine °terfenadine °This list may not describe all possible interactions. Give your health care provider a list of all the medicines, herbs, non-prescription drugs, or dietary supplements you use. Also tell them if you smoke, drink alcohol, or use illegal drugs.  Some items may interact with your medicine. °What should I watch for while using this medication? °Tell your doctor or healthcare professional if your symptoms do not start to get better or if they get worse. °Visit your doctor or health care professional for regular checks on your progress. Your condition will be monitored carefully while you are receiving this medicine. °This medicine may increase blood sugar. Ask your healthcare provider if changes in diet or medicines are needed if you have diabetes. °You may need blood work done while you are taking this medicine. °Women should inform their doctor if they wish to become pregnant or think they might be pregnant. There is a potential for serious side effects to an unborn child. Talk to your health care professional or pharmacist for more information. Do not breast-feed an infant while taking this medicine or for 6 months after stopping it. °This medicine has caused ovarian failure in some women. This medicine may interfere with the ability to have a child. Talk with your doctor or health care professional if you are concerned about your fertility. °What side effects may I notice from receiving this medication? °Side effects that you should report to your doctor or health care professional as soon as possible: °allergic reactions like skin rash, itching or hives, swelling of the face, lips, or tongue °increased blood pressure °severe stomach pain °signs and symptoms of hgh blood sugar such as being more thirsty or hungry or having to urinate more than normal. You may also feel very tired or have blurry vision. °signs and symptoms of low blood sugar such as feeling anxious; confusion; dizziness; increased hunger; unusually weak or tired; sweating; shakiness; cold; irritable; headache; blurred vision; fast   heartbeat; loss of consciousness °unusually slow heartbeat °Side effects that usually do not require medical attention (report to your doctor or health care  professional if they continue or are bothersome): °constipation °diarrhea °dizziness °headache °muscle pain °muscle spasms °nausea °pain, redness, or irritation at site where injected °This list may not describe all possible side effects. Call your doctor for medical advice about side effects. You may report side effects to FDA at 1-800-FDA-1088. °Where should I keep my medication? °This drug is given in a hospital or clinic and will not be stored at home. °NOTE: This sheet is a summary. It may not cover all possible information. If you have questions about this medicine, talk to your doctor, pharmacist, or health care provider. °© 2022 [employer]vier/Gold Standard (2018-09-06 00:00:00) ° °

## 2021-10-30 NOTE — Progress Notes (Signed)
Hematology and Oncology Follow Up Visit  Brian Mays [id number] Oct 24, 1946 75 y.o. 10/30/2021   Principle Diagnosis:  Metastatic low grade neuroendocrine tumor-hepatic metastases Iron deficiency anemia   Past Therapy: S/p yttrium-90 intrahepatic therapy - November 2016 Lutathera (Lu 177) injection on 02/01/2018 - s/p cycle 4 SBRT to T6 lesion -- 11/2021   Current Therapy:        Somatuline 120 mg monthly IV Iron as indicated    Interim History:  Brian Mays is here today for follow-up.  Unfortunately, the bad news is that his wife was found to have a brain tumor.  Sounds that this probably was a anaplastic astrocytoma.  It was partially resected.  She now is on radiation and Temodar.  Sound like she may be having a tough time with this.  He has seen Radiation Oncology for the T6 lesion.  He is not symptomatic with it.  I think is going to undergo radiosurgery for this in January.  Otherwise he seems to be doing pretty good.  He has had no problems with bowels or bladder.  There is no diarrhea.  He has had no cough or shortness of breath.  He has had no rashes.  There is been no leg swelling.  The last Chromogranin A level was 114 in November.  Overall, I would say his performance status is probably ECOG 1.   Medications:  Allergies as of 10/30/2021       Reactions   Iohexol Hives    Code: HIVES, Desc: PER [name] @ PRIMARY CARE, PT IS ALLERGIC TO CONTRAST DYE 10/02/08/RM  05/01/10...needs full premeds per our protocol w/ [facility] imaging., Onset Date: [id number]        Medication List        Accurate as of October 30, 2021 10:48 AM. If you have any questions, ask your nurse or doctor.          aspirin EC 81 MG tablet Take 81 mg by mouth every morning.   atorvastatin 20 MG tablet Commonly known as: LIPITOR TAKE 1 TABLET BY MOUTH DAILY AT 6 PM.   Cholecalciferol 25 MCG (1000 UT) tablet Take 1,000 Units by mouth daily.   clopidogrel 75 MG tablet Commonly known as:  PLAVIX TAKE 1 TABLET BY MOUTH EVERY DAY   fluticasone 50 MCG/ACT nasal spray Commonly known as: FLONASE Place 2 sprays into both nostrils daily as needed (sinuses).   hydrochlorothiazide 25 MG tablet Commonly known as: HYDRODIURIL TAKE 1/2 TABLET BY MOUTH EVERY DAY   hydrocortisone 25 MG suppository Commonly known as: ANUSOL-HC INSERT 1 SUPPOSITORY RECTALLY TWICE A DAY AS NEEDED FOR HEMORRHOIDS   ipratropium 0.03 % nasal spray Commonly known as: Atrovent Place 2 sprays into the nose 3 (three) times daily.   lansoprazole 30 MG capsule Commonly known as: PREVACID Take 1 capsule (30 mg total) by mouth daily at 12 noon.   levothyroxine 50 MCG tablet Commonly known as: SYNTHROID TAKE 1 TABLET BY MOUTH EVERY DAY BEFORE BREAKFAST   losartan 100 MG tablet Commonly known as: COZAAR TAKE 1 TABLET BY MOUTH EVERY DAY   metoprolol tartrate 25 MG tablet Commonly known as: LOPRESSOR TAKE 0.5 TABLETS BY MOUTH 2 TIMES DAILY.   nitroGLYCERIN 0.4 MG SL tablet Commonly known as: NITROSTAT PLACE 1 TABLET UNDER THE TONGUE EVERY 5 (FIVE) MINUTES X 3 DOSES AS NEEDED FOR CHEST PAIN.   SOMATULINE DEPOT [place] Inject 120 mcg into the skin every 28 (twenty-eight) days. Receives at Dr Antonieta Pert office  sucralfate 1 g tablet Commonly known as: Carafate Take 1 tablet (1 g total) by mouth 4 (four) times daily -  with meals and at bedtime.   vitamin B-12 1000 MCG tablet Commonly known as: CYANOCOBALAMIN Take 1,000 mcg by mouth every evening.        Allergies:  Allergies  Allergen Reactions   Iohexol Hives     Code: HIVES, Desc: PER [name] @ PRIMARY CARE, PT IS ALLERGIC TO CONTRAST DYE 10/02/08/RM  05/01/10...needs full premeds per our protocol w/ [facility] imaging., Onset Date: [id number]     Past Medical History, Surgical history, Social history, and Family History were reviewed and updated.  Review of Systems: Review of Systems  Constitutional: Negative.   HENT: Negative.    Eyes: Negative.    Respiratory: Negative.    Cardiovascular: Negative.   Gastrointestinal: Negative.   Genitourinary: Negative.   Musculoskeletal: Negative.   Skin: Negative.   Neurological: Negative.   Endo/Heme/Allergies: Negative.   Psychiatric/Behavioral: Negative.      Physical Exam:  weight is 203 lb (92.1 kg). His oral temperature is 98.7 F (37.1 C). His blood pressure is 153/64 (abnormal) and his pulse is 62. His respiration is 18 and oxygen saturation is 98%.   Wt Readings from Last 3 Encounters:  10/30/21 203 lb (92.1 kg)  10/13/21 203 lb (92.1 kg)  10/02/21 203 lb 6.4 oz (92.3 kg)    Physical Exam Vitals reviewed.  HENT:     Head: Normocephalic and atraumatic.  Eyes:     Pupils: Pupils are equal, round, and reactive to light.  Cardiovascular:     Rate and Rhythm: Normal rate and regular rhythm.     Heart sounds: Normal heart sounds.  Pulmonary:     Effort: Pulmonary effort is normal.     Breath sounds: Normal breath sounds.  Abdominal:     General: Bowel sounds are normal.     Palpations: Abdomen is soft.  Musculoskeletal:        General: No tenderness or deformity. Normal range of motion.     Cervical back: Normal range of motion.  Lymphadenopathy:     Cervical: No cervical adenopathy.  Skin:    General: Skin is warm and dry.     Findings: No erythema or rash.  Neurological:     Mental Status: He is alert and oriented to person, place, and time.  Psychiatric:        Behavior: Behavior normal.        Thought Content: Thought content normal.        Judgment: Judgment normal.     Lab Results  Component Value Date   WBC 6.9 10/30/2021   HGB 12.8 (L) 10/30/2021   HCT 39.2 10/30/2021   MCV 93.8 10/30/2021   PLT 148 (L) 10/30/2021   Lab Results  Component Value Date   FERRITIN 282 07/23/2021   IRON 72 07/23/2021   TIBC 299 07/23/2021   UIBC 227 07/23/2021   IRONPCTSAT 24 07/23/2021   Lab Results  Component Value Date   RETICCTPCT 1.5 07/23/2021   RBC 4.18  (L) 10/30/2021   No results found for: KPAFRELGTCHN, LAMBDASER, KAPLAMBRATIO No results found for: IGGSERUM, IGA, IGMSERUM No results found for: Odetta Pink, SPEI   Chemistry      Component Value Date/Time   NA 140 10/30/2021 0956   NA 138 09/15/2018 0934   NA 144 10/04/2017 0904   NA 141 07/23/2016 1255   K  4.3 10/30/2021 0956   K 3.8 10/04/2017 0904   K 4.1 07/23/2016 1255   CL 102 10/30/2021 0956   CL 103 10/04/2017 0904   CO2 32 10/30/2021 0956   CO2 28 10/04/2017 0904   CO2 27 07/23/2016 1255   BUN 14 10/30/2021 0956   BUN 14 09/15/2018 0934   BUN 14 10/04/2017 0904   BUN 13.8 07/23/2016 1255   CREATININE 0.98 10/30/2021 0956   CREATININE 0.8 10/04/2017 0904   CREATININE 1.0 07/23/2016 1255      Component Value Date/Time   CALCIUM 9.5 10/30/2021 0956   CALCIUM 9.4 10/04/2017 0904   CALCIUM 9.2 07/23/2016 1255   ALKPHOS 51 10/30/2021 0956   ALKPHOS 64 10/04/2017 0904   ALKPHOS 77 07/23/2016 1255   AST 23 10/30/2021 0956   AST 23 07/23/2016 1255   ALT 15 10/30/2021 0956   ALT 22 10/04/2017 0904   ALT 13 07/23/2016 1255   BILITOT 0.7 10/30/2021 0956   BILITOT 1.13 07/23/2016 1255       Impression and Plan: Mr. Kamel is a very pleasant 75 yo caucasian gentleman with metastatic low-grade neuroendocrine carcinoma with hepatic metastasis.  With his last scan, there was an area on T6 that looked abnormal.  Again is not symptomatic with this.  However, we will going to treat this so that he does not have any problems with symptoms.  I do not think we have to put him on any type of bisphosphonate for this.  We will continue him on the Somatuline.  I will certainly pray hard for his poor wife.  I just feel bad for her.  I know his daughter is also having some of her own issues.  We will have to pray for her.  We will continue to follow him up monthly.  I will plan to get her back in late January.    Volanda Napoleon, MD 12/30/202210:48 AM

## 2021-10-31 ENCOUNTER — Other Ambulatory Visit: Payer: Self-pay | Admitting: Cardiovascular Disease

## 2021-10-31 DIAGNOSIS — I10 Essential (primary) hypertension: Secondary | ICD-10-CM

## 2021-11-02 ENCOUNTER — Other Ambulatory Visit: Payer: Self-pay | Admitting: Cardiovascular Disease

## 2021-11-02 DIAGNOSIS — E7800 Pure hypercholesterolemia, unspecified: Secondary | ICD-10-CM

## 2021-11-03 ENCOUNTER — Other Ambulatory Visit: Payer: Self-pay

## 2021-11-03 ENCOUNTER — Ambulatory Visit
Admission: RE | Admit: 2021-11-03 | Discharge: 2021-11-03 | Disposition: A | Discharge status: Home / Self Care | Payer: Medicare HMO | Source: Ambulatory Visit | Attending: Radiation Oncology | Admitting: Radiation Oncology

## 2021-11-03 ENCOUNTER — Telehealth: Payer: Self-pay | Admitting: Hematology & Oncology

## 2021-11-03 DIAGNOSIS — C801 Malignant (primary) neoplasm, unspecified: Secondary | ICD-10-CM | POA: Insufficient documentation

## 2021-11-03 DIAGNOSIS — Z510 Encounter for antineoplastic radiation therapy: Secondary | ICD-10-CM | POA: Diagnosis present

## 2021-11-03 DIAGNOSIS — C7951 Secondary malignant neoplasm of bone: Secondary | ICD-10-CM | POA: Insufficient documentation

## 2021-11-03 DIAGNOSIS — C7A8 Other malignant neuroendocrine tumors: Secondary | ICD-10-CM | POA: Diagnosis not present

## 2021-11-03 LAB — CHROMOGRANIN A: Chromogranin A (ng/mL): 114.6 ng/mL — ABNORMAL HIGH (ref 0.0–101.8)

## 2021-11-04 ENCOUNTER — Other Ambulatory Visit: Payer: Self-pay | Admitting: Cardiovascular Disease

## 2021-11-05 DIAGNOSIS — D492 Neoplasm of unspecified behavior of bone, soft tissue, and skin: Secondary | ICD-10-CM | POA: Diagnosis not present

## 2021-11-06 ENCOUNTER — Encounter: Payer: Self-pay | Admitting: Radiation Oncology

## 2021-11-06 ENCOUNTER — Ambulatory Visit
Admission: RE | Admit: 2021-11-06 | Discharge: 2021-11-06 | Disposition: A | Discharge status: Home / Self Care | Payer: Medicare HMO | Source: Ambulatory Visit | Attending: Radiation Oncology | Admitting: Radiation Oncology

## 2021-11-06 DIAGNOSIS — Z510 Encounter for antineoplastic radiation therapy: Secondary | ICD-10-CM | POA: Diagnosis not present

## 2021-11-06 DIAGNOSIS — C7A8 Other malignant neuroendocrine tumors: Secondary | ICD-10-CM | POA: Diagnosis not present

## 2021-11-06 DIAGNOSIS — C7951 Secondary malignant neoplasm of bone: Secondary | ICD-10-CM | POA: Diagnosis not present

## 2021-11-06 DIAGNOSIS — C801 Malignant (primary) neoplasm, unspecified: Secondary | ICD-10-CM | POA: Diagnosis not present

## 2021-11-06 NOTE — Progress Notes (Signed)
[name] rested with [place] for 30 minutes following his SRS treatment.  Patient denies fatigue, skin irritation or irritation to his esophagus.  Patient without complaints. Patient informed of pain flare and was advised give the on call provider a call.  Understands to avoid strenuous activity for the next 24 hours and call [phone] with needs.   BP (!) 161/77 (BP Location: Left Arm)    Pulse 69    Temp 98.6 F (37 C)    Resp 18    SpO2 98%    Kalid Ghan M. Leonie Green, BSN

## 2021-11-06 NOTE — Op Note (Signed)
° °  Name: Brian Mays  MRN: [id number]  Date: 11/06/2021   DOB: Jan 23, 1946  Stereotactic Radiosurgery Operative Note  PRE-OPERATIVE DIAGNOSIS:  Spinal Metastasis  POST-OPERATIVE DIAGNOSIS:  Spinal Metastasis  PROCEDURE:  Stereotactic Radiosurgery  SURGEON:  Eustace Moore, MD  NARRATIVE: The patient underwent a radiation treatment planning session in the radiation oncology simulation suite under the care of the radiation oncology physician and physicist.  I participated closely in the radiation treatment planning afterwards. The patient underwent planning CT myelogram which was fused to the MRI.  These images were fused on the planning system.  Radiation oncology contoured the gross target volume and subsequently expanded this to yield the Planning Target Volume. I actively participated in the planning process.  I helped to define and review the target contours and also the contours of the spinal cord, and selected nearby organs at risk.  All the dose constraints for critical structures were reviewed and compared to AAPM Task Group 101.  The prescription dose conformity was reviewed.  I approved the plan electronically.    Accordingly, [name] was brought to the TrueBeam stereotactic radiation treatment linac and placed in the custom immobilization device.  The patient was aligned according to the IR fiducial markers with BrainLab Exactrac, then orthogonal x-rays were used in ExacTrac with the 6DOF robotic table and the shifts were made to align the patient.  Then conebeam CT was performed to verify precision.  [name] received stereotactic radiosurgery uneventfully.  The detailed description of the procedure is recorded in the radiation oncology procedure note.  I was present for the duration of the procedure.  DISPOSITION:  Following delivery, the patient was transported to nursing in stable condition and monitored for possible acute effects to be discharged to home in stable  condition with follow-up in one month.  Eustace Moore, MD 11/06/2021 3:34 PM

## 2021-11-09 ENCOUNTER — Encounter: Payer: Self-pay | Admitting: Family Medicine

## 2021-11-09 DIAGNOSIS — K219 Gastro-esophageal reflux disease without esophagitis: Secondary | ICD-10-CM

## 2021-11-09 NOTE — Addendum Note (Signed)
Addended by: Lamar Blinks C on: 11/09/2021 12:33 PM   Modules accepted: Orders

## 2021-11-09 NOTE — Telephone Encounter (Signed)
Appointment note edited to reflect referral to GI

## 2021-11-11 ENCOUNTER — Encounter: Payer: Self-pay | Admitting: Family

## 2021-11-11 MED ORDER — SUCRALFATE 1 G PO TABS: 1.0000 g | ORAL_TABLET | Freq: Three times a day (TID) | ORAL | 0 refills | Status: DC

## 2021-11-11 NOTE — Addendum Note (Signed)
Addended by: Lamar Blinks C on: 11/11/2021 08:42 PM   Modules accepted: Orders

## 2021-11-11 NOTE — Progress Notes (Signed)
° °                                                                                                                                                          °  Patient Name: Brian Mays MRN: [id number] DOB: [birth year] Referring Physician: Burney Gauze (Profile Not Attached) Date of Service: 11/06/2021 [facility], [place]                                                        End Of Treatment Note  Diagnoses: C79.51-Secondary malignant neoplasm of bone C7B.8-Other secondary neuroendocrine tumors  Cancer Staging: Metastatic Neuroendocrine Tumor to Thoracic Spine levels T6 and T12  Intent: Palliative  Radiation Treatment Dates:  11/06/2021 through 11/06/2021 S[facility] Spine Site Technique Total Dose (Gy) Dose per Fx (Gy) Completed Fx Beam Energies  Thoracic Spine: Spine_T6 IMRT 18/18 18 1/1 6XFFF  Thoracic Spine: Spine_T12 IMRT 18/18 18 1/1 6XFFF   Narrative: The patient tolerated radiation therapy relatively well.   Plan: The patient will receive a call in about one month from the radiation oncology department. We will plan to follow him in the spine oncology conference with surveillance MRIs. He will continue follow up with Dr. Marin Olp as well.   ________________________________________________    Carola Rhine, [facility]

## 2021-11-13 ENCOUNTER — Other Ambulatory Visit: Payer: Self-pay

## 2021-11-13 ENCOUNTER — Emergency Department (HOSPITAL_BASED_OUTPATIENT_CLINIC_OR_DEPARTMENT_OTHER)
Admission: EM | Admit: 2021-11-13 | Discharge: 2021-11-13 | Disposition: A | Discharge status: Home / Self Care | Payer: Medicare HMO | Attending: Emergency Medicine | Admitting: Emergency Medicine

## 2021-11-13 ENCOUNTER — Emergency Department (HOSPITAL_BASED_OUTPATIENT_CLINIC_OR_DEPARTMENT_OTHER): Payer: Medicare HMO

## 2021-11-13 ENCOUNTER — Encounter (HOSPITAL_BASED_OUTPATIENT_CLINIC_OR_DEPARTMENT_OTHER): Payer: Self-pay | Admitting: Emergency Medicine

## 2021-11-13 DIAGNOSIS — I10 Essential (primary) hypertension: Secondary | ICD-10-CM | POA: Insufficient documentation

## 2021-11-13 DIAGNOSIS — Z8589 Personal history of malignant neoplasm of other organs and systems: Secondary | ICD-10-CM | POA: Diagnosis not present

## 2021-11-13 DIAGNOSIS — Z87891 Personal history of nicotine dependence: Secondary | ICD-10-CM | POA: Insufficient documentation

## 2021-11-13 DIAGNOSIS — R109 Unspecified abdominal pain: Secondary | ICD-10-CM | POA: Diagnosis not present

## 2021-11-13 DIAGNOSIS — R142 Eructation: Secondary | ICD-10-CM | POA: Diagnosis not present

## 2021-11-13 DIAGNOSIS — E039 Hypothyroidism, unspecified: Secondary | ICD-10-CM | POA: Diagnosis not present

## 2021-11-13 DIAGNOSIS — C787 Secondary malignant neoplasm of liver and intrahepatic bile duct: Secondary | ICD-10-CM | POA: Diagnosis not present

## 2021-11-13 DIAGNOSIS — C220 Liver cell carcinoma: Secondary | ICD-10-CM | POA: Diagnosis not present

## 2021-11-13 IMAGING — CT CT CHEST-ABD-PELV W/O CM
2 of 4 series · 15 of 46 positions shown, 17 images · non-contrast
Comparison: [DATE].  PET CT [DATE]

CLINICAL DATA: Hepatocellular carcinoma, assess treatment response



[Series 2: cap without · axial · non-contrast · 0.77mm/px · z∈[+324,+884]mm · 12 of 132 slices shown, 14 images]
[im 10/132  soft-tissue]
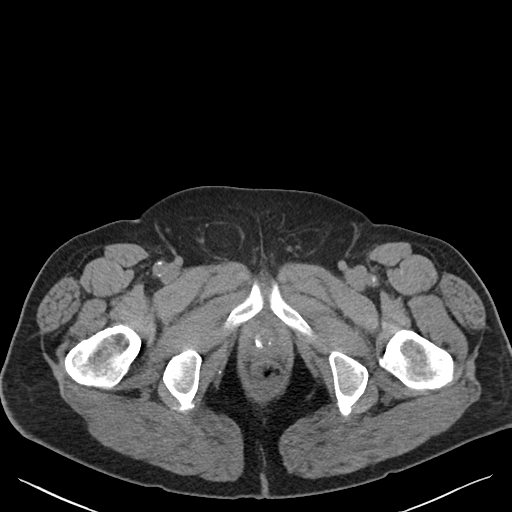
[im 10/132  bone]
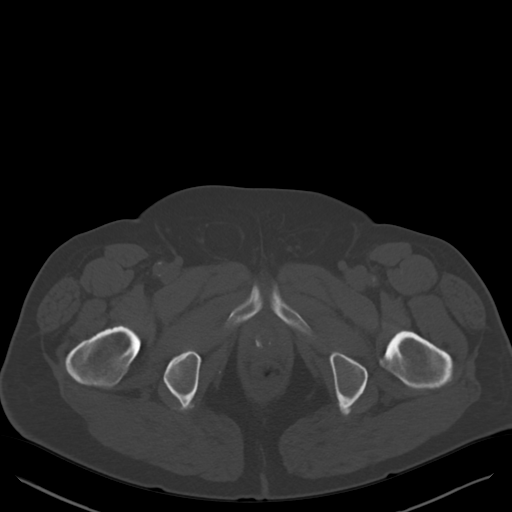
[im 19/132  soft-tissue]
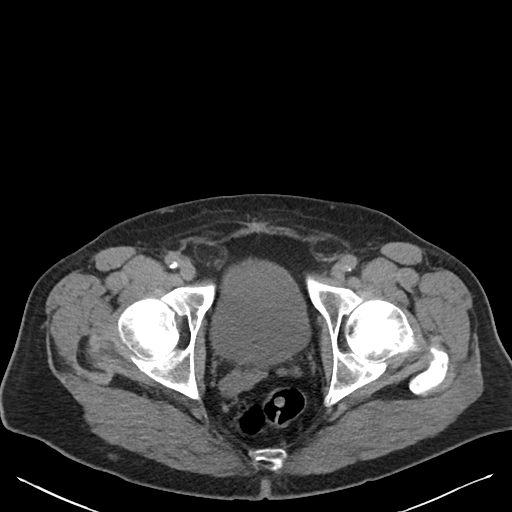
[im 29/132  soft-tissue]
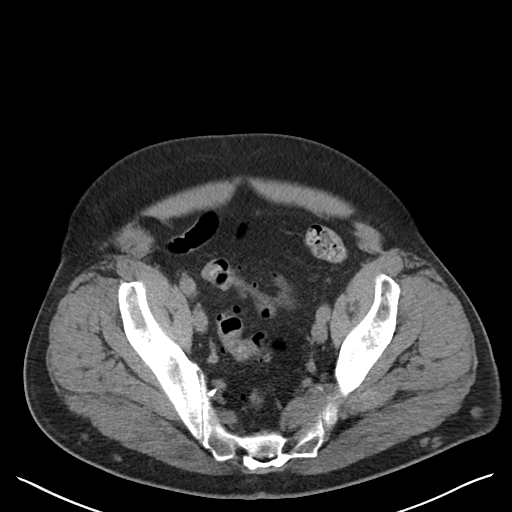
[im 38/132  soft-tissue]
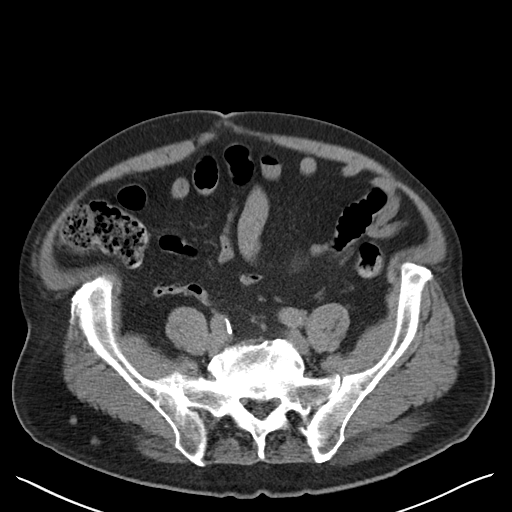
[im 47/132  soft-tissue]
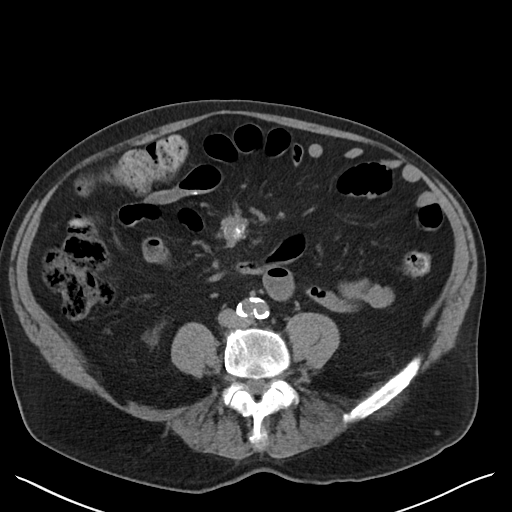
[im 57/132  soft-tissue]
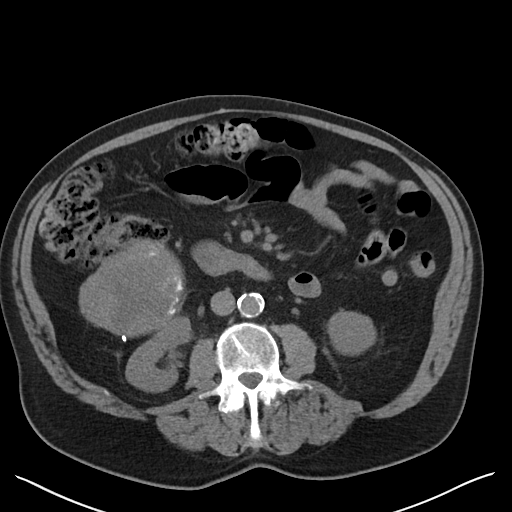
[im 75/132  soft-tissue]
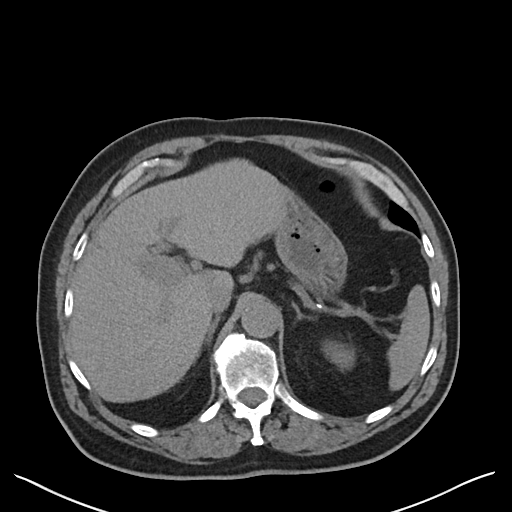
[im 85/132  soft-tissue]
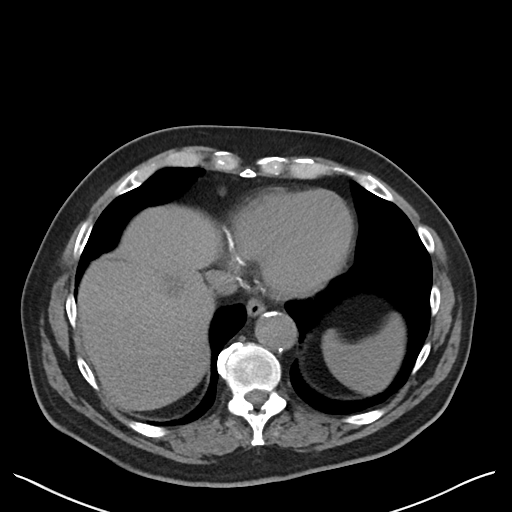
[im 94/132  soft-tissue]
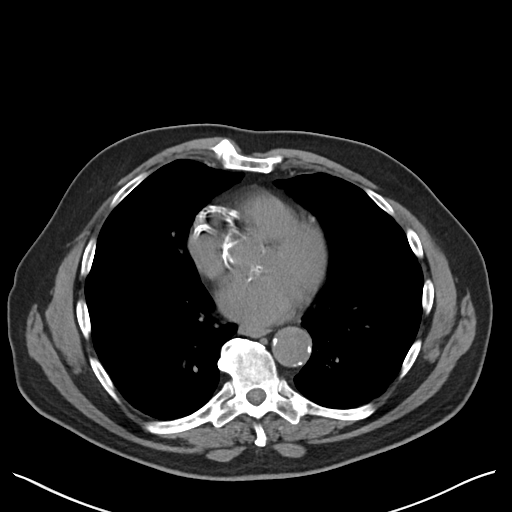
[im 94/132  bone]
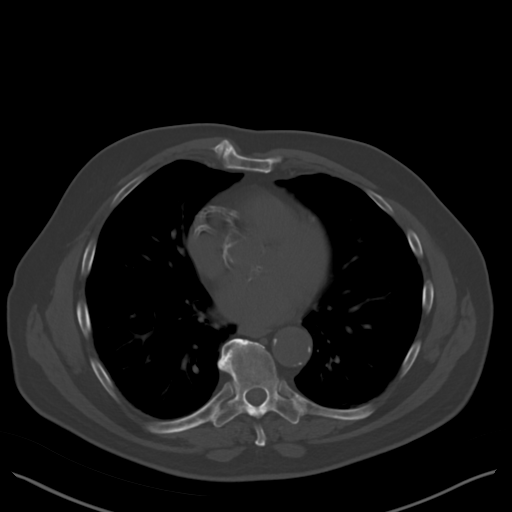
[im 103/132  soft-tissue]
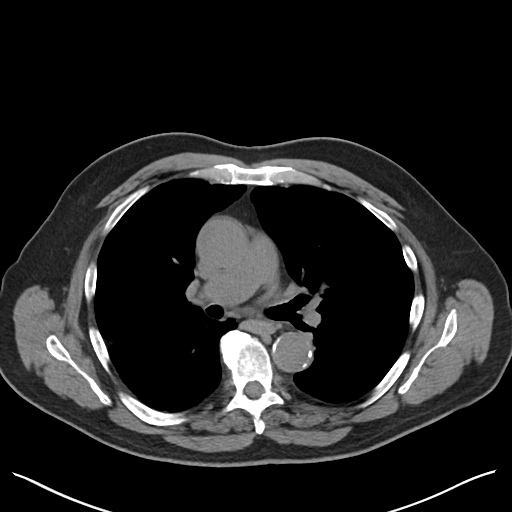
[im 113/132  soft-tissue]
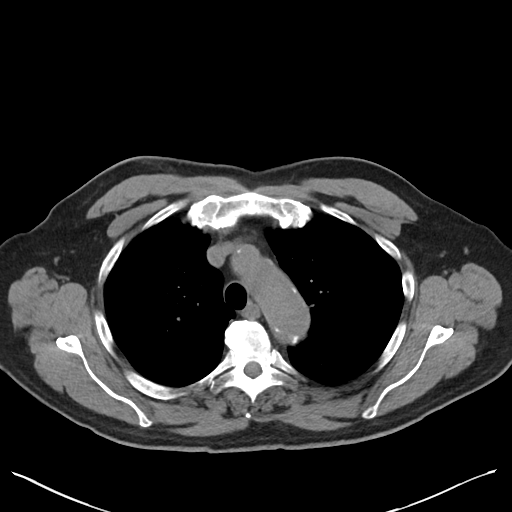
[im 122/132  soft-tissue]
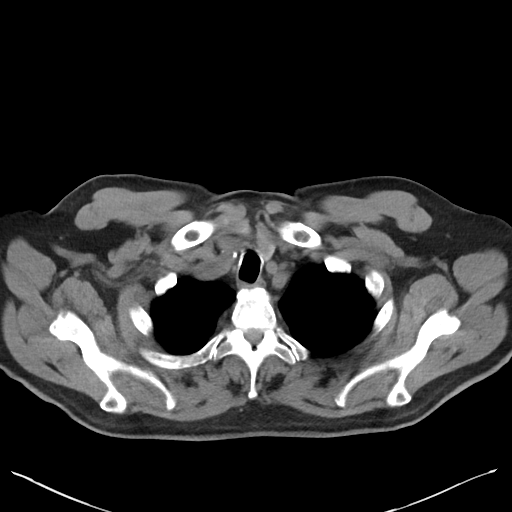

[Series 5: coronal · coronal · 0.92mm/px · 3 of 107 slices shown]
[im 36/107  soft-tissue]
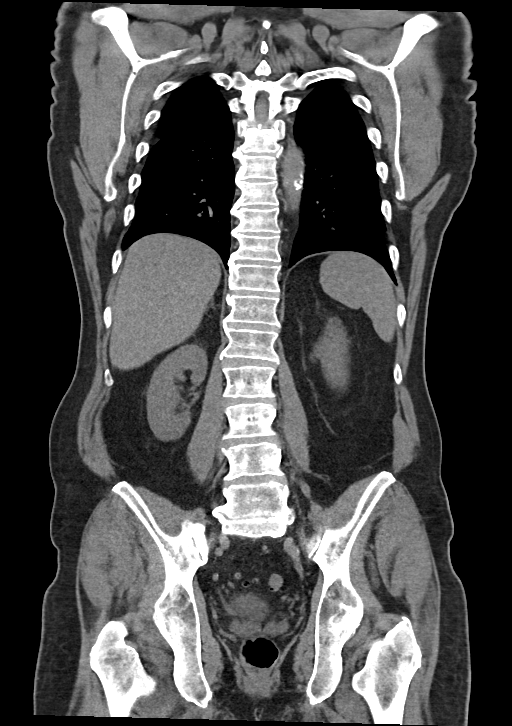
[im 48/107  soft-tissue]
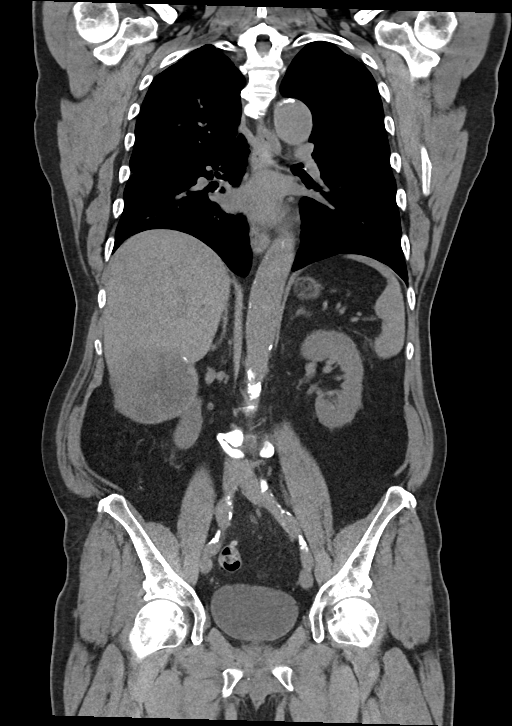
[im 59/107  soft-tissue]
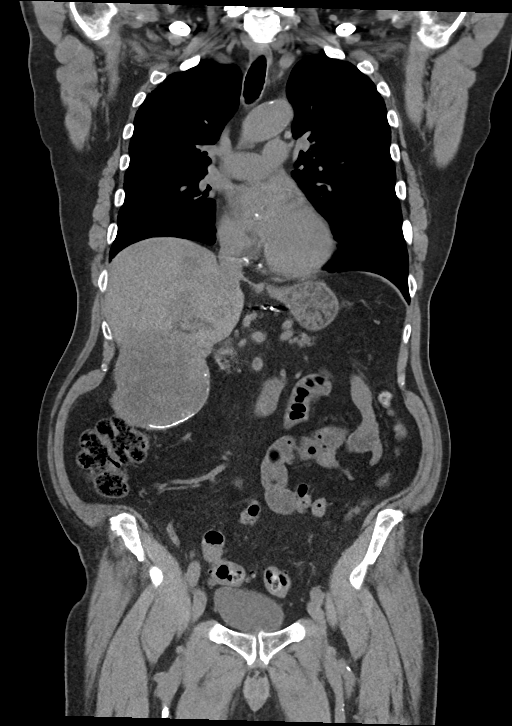

[15 of 46 positions shown; findings below may reference images not displayed]

FINDINGS: CT CHEST FINDINGS

Cardiovascular: Heavily calcified aorta and coronary arteries. Heart
is normal size. Aorta is normal caliber.

Mediastinum/Nodes: No mediastinal, hilar, or axillary adenopathy.
Trachea and esophagus are unremarkable. Thyroid unremarkable.

Lungs/Pleura: Lungs are clear. No focal airspace opacities or
suspicious nodules. No effusions.

Musculoskeletal: Previously seen metastases at T6 and T12 by PET CT
not appreciated on CT. No acute bony abnormality.

CT ABDOMEN PELVIS FINDINGS

Hepatobiliary: Large mass in the inferior right hepatic lobe
measures 9.6 x 9.4 cm compared to 9 x 8.4 cm previously. Left
hepatic metastasis measures up to 5 cm compared to 4.5 cm
previously. Right hepatic dome metastasis measures 2.1 cm compared
to 1.7 cm previously. Prior cholecystectomy.

Pancreas: No focal abnormality or ductal dilatation.

Spleen: No focal abnormality.  Normal size.

Adrenals/Urinary Tract: No adrenal abnormality. No focal renal
abnormality. No stones or hydronephrosis. Urinary bladder is
unremarkable.

Stomach/Bowel: Stomach, large and small bowel grossly unremarkable.

Vascular/Lymphatic: Calcified aorta. No evidence of aneurysm or
adenopathy.

Reproductive: No visible focal abnormality.

Other: No free fluid or free air. Partially calcified central
mesenteric mass measures 2.3 cm compared to 2.2 cm previously.

Musculoskeletal: No acute bony abnormality.
IMPRESSION: Multiple hepatic metastases slightly larger in size when compared to
most recent PET CT.

Stable partially calcified central mesenteric mass.

Coronary artery disease, aortic atherosclerosis.

Previously seen skeletal metastases at T6 and T12 not appreciable by
today CT.

## 2021-11-13 MED ORDER — SIMETHICONE 40 MG/0.6ML PO SUSP (UNIT DOSE)
40.0000 mg | Freq: Once | ORAL | Status: AC
  Administered 2021-11-13: 40 mg via ORAL
  Filled 2021-11-13: qty 0.6

## 2021-11-13 MED ORDER — PROMETHAZINE HCL 25 MG PO TABS: 25.0000 mg | ORAL_TABLET | Freq: Four times a day (QID) | ORAL | 0 refills | Status: DC | PRN

## 2021-11-13 MED ORDER — PROMETHAZINE HCL 25 MG/ML IJ SOLN
12.5000 mg | Freq: Once | INTRAMUSCULAR | Status: AC
  Administered 2021-11-13: 12.5 mg via INTRAMUSCULAR
  Filled 2021-11-13: qty 1

## 2021-11-13 NOTE — ED Notes (Signed)
Patient transported to CT 

## 2021-11-13 NOTE — Discharge Instructions (Addendum)
You were evaluated in the Emergency Department and after careful evaluation, we did not find any emergent condition requiring admission or further testing in the hospital.  Your exam/testing today was overall reassuring.  Recommend follow-up with your regular doctors to discuss your symptoms.  Can use the Phenergan as needed to try to help with your belching.  Please return to the Emergency Department if you experience any worsening of your condition.  Thank you for allowing [place] to be a part of your care.

## 2021-11-13 NOTE — ED Provider Notes (Signed)
[facility] Emergency Department Provider Note MRN:  [id number]  Arrival date & time: 11/13/21     Chief Complaint   Abdominal Pain   History of Present Illness   Brian Mays is a 76 y.o. year-old male with a history of metastatic neuroendocrine cancer presenting to the ED with chief complaint of belching.  Constant belching and passage of flatulence, has been going on for several weeks but much worse over the past 3 or 4 days.  Has not been able to sleep due to the symptoms.  Denies any significant pain, no vomiting or diarrhea, no abdominal pain, no fever.  Had radiation therapy on the spine recently.  Review of Systems  A thorough review of systems was obtained and all systems are negative except as noted in the HPI and PMH.   Patient's Health History    Past Medical History:  Diagnosis Date   Anemia    in past   Colon polyps    Diverticulosis    Gallstones    GERD (gastroesophageal reflux disease)    Heart attack ([place])    mild, Spring 2017   Heart murmur    Hemorrhoid    Hiatal hernia    HTN (hypertension)    Hypothyroidism    Iron deficiency anemia due to chronic blood loss 03/29/2019   Iron malabsorption 03/29/2019   Metastatic carcinoma ([place]) 2010   Dr Jonette Eva   Neuroendocrine cancer [facility]) 08/2021   Pancreatitis 1998   chronic    Past Surgical History:  Procedure Laterality Date   APPENDECTOMY  1962   CARDIAC CATHETERIZATION N/A 02/10/2016   Procedure: Left Heart Cath and Coronary Angiography;  Surgeon: Adrian Prows, MD;  Location: [place] CV LAB;  Service: Cardiovascular;  Laterality: N/A;   CARDIAC CATHETERIZATION  02/10/2016   Procedure: Coronary/Graft Atherectomy;  Surgeon: Adrian Prows, MD;  Location: [place] CV LAB;  Service: Cardiovascular;;   CARDIAC CATHETERIZATION  02/10/2016   Procedure: Coronary Stent Intervention;  Surgeon: Adrian Prows, MD;  Location: [place] CV LAB;  Service: Cardiovascular;;   CATARACT EXTRACTION W/  INTRAOCULAR LENS  IMPLANT, BILATERAL Bilateral    IR GENERIC HISTORICAL  12/30/2015   IR RADIOLOGIST EVAL & MGMT 12/30/2015 Aletta Edouard, MD [facility] INTERV RAD   IR GENERIC HISTORICAL  11/09/2016   IR RADIOLOGIST EVAL & MGMT 11/09/2016 Aletta Edouard, MD [facility] INTERV RAD   IR RADIOLOGIST EVAL & MGMT  03/01/2017   IR RADIOLOGIST EVAL & MGMT  06/29/2017   LAPAROSCOPIC CHOLECYSTECTOMY  1999   LIVER BIOPSY  2010   RADIOACTIVE SEED IMPLANT  X 3   "to my liver"   TUMOR EXCISION  01/2009   Carcinoil Resection    TUMOR REMOVAL     from small intestine    Family History  Problem Relation Age of Onset   Kidney disease Mother    Hyperlipidemia Mother    Hypertension Mother    COPD Father    Ulcerative colitis Daughter     Social History   Socioeconomic History   Marital status: Married    Spouse name: Not on file   Number of children: 1   Years of education: Not on file   Highest education level: Not on file  Occupational History   Occupation: Scientist, clinical (histocompatibility and immunogenetics): [employer]  Tobacco Use   Smoking status: Former    Packs/day: 1.50    Years: 35.00    Pack years: 52.50    Types: Cigarettes  Start date: 09/25/1959    Quit date: 12/21/1997    Years since quitting: 23.9   Smokeless tobacco: Never  Vaping Use   Vaping Use: Never used  Substance and Sexual Activity   Alcohol use: Yes    Alcohol/week: 3.0 - 4.0 standard drinks    Types: 3 - 4 Cans of beer per week    Comment: 4 beers weekly    Drug use: No   Sexual activity: Yes  Other Topics Concern   Not on file  Social History Narrative   Regular Exercise -  NO   Social Determinants of Health   Financial Resource Strain: Not on file  Food Insecurity: Not on file  Transportation Needs: Not on file  Physical Activity: Not on file  Stress: Not on file  Social Connections: Not on file  Intimate Partner Violence: Not At Risk   Fear of Current or Ex-Partner: No   Emotionally Abused: No   Physically Abused: No    Sexually Abused: No     Physical Exam   Vitals:   11/13/21 0132  BP: (!) 183/89  Pulse: 73  Resp: 20  Temp: 97.9 F (36.6 C)  SpO2: 96%    CONSTITUTIONAL: Well-appearing, NAD, intermittently belching NEURO/PSYCH:  Alert and oriented x 3, no focal deficits EYES:  eyes equal and reactive ENT/NECK:  no LAD, no JVD CARDIO: Regular rate, well-perfused, normal S1 and S2 PULM:  CTAB no wheezing or rhonchi GI/GU:  non-distended, non-tender MSK/SPINE:  No gross deformities, no edema SKIN:  no rash, atraumatic   *Additional and/or pertinent findings included in MDM below  Diagnostic and Interventional Summary    EKG Interpretation  Date/Time:    Ventricular Rate:    PR Interval:    QRS Duration:   QT Interval:    QTC Calculation:   R Axis:     Text Interpretation:         Labs Reviewed - No data to display  CT CHEST ABDOMEN PELVIS WO CONTRAST  Final Result      Medications  simethicone (MYLICON) 40 JY/7.8GN suspension 40 mg (40 mg Oral Given 11/13/21 0146)  promethazine (PHENERGAN) injection 12.5 mg (12.5 mg Intramuscular Given 11/13/21 0231)     Procedures  /  Critical Care Procedures  ED Course and Medical Decision Making  Initial Impression and Ddx Unclear cause of patient's belching, emergent process felt to be unlikely given the lack of abdominal pain or tenderness, though he does have some mild distention and he is obviously frustrated and unable to sleep.  He does have a number of comorbidities, most notably metastatic cancer.  Low concern but considering small bowel obstruction, some type of inflammation from the recent radiation treatments causing frequent belching or some type of GI disturbance.  Also considering worsening of metastatic disease causing the issue.  Obtaining CT imaging and will reassess.  Past medical/surgical history that increases complexity of ED encounter: Metastatic cancer  Interpretation of Diagnostics I personally reviewed the CT  abdomen and my interpretation is as follows: Liver lesions, otherwise no free air or obvious abnormalities    CT read as overall stable by radiology.  Patient Reassessment and Ultimate Disposition/Management Patient is feeling better, able to get some sleep, appropriate for discharge.  Patient management required discussion with the following services or consulting groups:  None  Complexity of Problems Addressed Chronic illness with exacerbation  Additional Data Reviewed and Analyzed Further history obtained from: Recent Consult notes  Factors Impacting ED Encounter Risk  Prescriptions  Jaiceon Collister. [place] Small, [place] [email or web address]  Final Clinical Impressions(s) / ED Diagnoses     ICD-10-CM   1. Belching  R14.2       ED Discharge Orders          Ordered    promethazine (PHENERGAN) 25 MG tablet  Every 6 hours PRN        11/13/21 2778             Discharge Instructions Discussed with and Provided to Patient:     Discharge Instructions      You were evaluated in the Emergency Department and after careful evaluation, we did not find any emergent condition requiring admission or further testing in the hospital.  Your exam/testing today was overall reassuring.  Recommend follow-up with your regular doctors to discuss your symptoms.  Can use the Phenergan as needed to try to help with your belching.  Please return to the Emergency Department if you experience any worsening of your condition.  Thank you for allowing [place] to be a part of your care.        Maudie Flakes, MD 11/13/21 [phone]

## 2021-11-13 NOTE — ED Triage Notes (Signed)
°  Patient comes in with 3-4 day hx of excessive belching and flatulence.  Patient states he has GERD and did an E-visit with his PCP.  They suggested changing his prevacid dose and gave him a prescription for carafate.  Patient took carafate as prescribed today but did not help the gas.  Patient states he is unable to sleep and feels bloated.   No pain at this time.

## 2021-11-13 NOTE — ED Notes (Signed)
Pt still in room, drowsy after phenergan shot, will re-evaluate.

## 2021-11-13 NOTE — ED Notes (Signed)
Pt awake, at nurses station, reports he can not go back to sleep. Pt reports he is okay to drive home.

## 2021-11-14 NOTE — Progress Notes (Signed)
[place] at [facility] [address], [place], [place] [address] [phone]  Date:  11/16/2021   Name:  Brian Mays   DOB:  [birth year]   MRN:  [id number]  PCP:  Darreld Mclean, MD    Chief Complaint: Gastroesophageal Reflux (Acid reflux- referral to GI. Seen at the ED: 11/13/20 for Belching pt was prescribed sucralfate (CARAFATE) 1 g tablet on 11/09/21. Appointment pending on Wednesday afternoon. )   History of Present Illness:  Brian Mays is a 76 y.o. very pleasant male patient who presents with the following:  Patient seen today with concern of severe GERD symptoms He had contacted me via MyChart about this issue, I encouraged him to double up on his Prevacid and also added Carafate He does have an appointment to see GI coming up in 2 days He was seen in the ER on January 13 due to severity of symptoms He was evaluated, no acute pathology discovered.  He was released to home-they gave him Phenergan to use as needed He is seeing GI in 2 days and thinks he is okay until then He is not vomiting but notes that almost anything he tries to eat exacerbates his symptoms  Most recent visit with myself was in November for physical-  history of metastatic malignant neuroendocrine tumor in the liver as well as iron malabsorption, CAD status post stenting in 2017, hypothyroidism, hypertension, chronic pancreatitis  In addition to his own cancer, his wife was found to have a brain tumor not long ago.  She has had surgery and is now undergoing radiation Brian Mays reports she is on her 4th week of radiation and is making progress     Patient Active Problem List   Diagnosis Date Noted   Prediabetes 09/03/2021   Near syncope 04/22/2020   Iron deficiency anemia due to chronic blood loss 03/29/2019   Iron malabsorption 03/29/2019   Lumbar radiculopathy 08/16/2017   CAD (coronary artery disease) 09/21/2016   Unstable angina ([place]) 02/09/2016    Vitamin D deficiency 03/11/2015   Metastatic malignant neuroendocrine tumor to liver ([place]) 02/14/2015   Hyperglycemia 04/03/2012   Diarrhea 03/29/2012   B12 deficiency 04/19/2011   TOBACCO USE, QUIT 10/13/2009   Unspecified vitamin D deficiency 06/19/2009   Neoplasm by body site 12/23/2008   DIVERTICULOSIS, COLON 11/08/2008   ABDOMINAL PAIN 10/02/2008   Hypothyroidism 12/13/2007   HEMORRHOIDS, NOS 12/13/2007   COLONIC POLYPS, HX OF 12/13/2007   Essential hypertension 09/15/2007   GERD 09/15/2007   PANCREATITIS, CHRONIC 09/15/2007   CARDIAC MURMUR 09/15/2007   SNORING 09/15/2007    Past Medical History:  Diagnosis Date   Anemia    in past   Colon polyps    Diverticulosis    Gallstones    GERD (gastroesophageal reflux disease)    Heart attack ([place])    mild, Spring 2017   Heart murmur    Hemorrhoid    Hiatal hernia    HTN (hypertension)    Hypothyroidism    Iron deficiency anemia due to chronic blood loss 03/29/2019   Iron malabsorption 03/29/2019   Metastatic carcinoma ([place]) 2010   Dr Jonette Eva   Neuroendocrine cancer [facility]) 08/2021   Pancreatitis 1998   chronic    Past Surgical History:  Procedure Laterality Date   APPENDECTOMY  1962   CARDIAC CATHETERIZATION N/A 02/10/2016   Procedure: Left Heart Cath and Coronary Angiography;  Surgeon: Adrian Prows, MD;  Location: [place]  CV LAB;  Service: Cardiovascular;  Laterality: N/A;   CARDIAC CATHETERIZATION  02/10/2016   Procedure: Coronary/Graft Atherectomy;  Surgeon: Adrian Prows, MD;  Location: [place] CV LAB;  Service: Cardiovascular;;   CARDIAC CATHETERIZATION  02/10/2016   Procedure: Coronary Stent Intervention;  Surgeon: Adrian Prows, MD;  Location: [place] CV LAB;  Service: Cardiovascular;;   CATARACT EXTRACTION W/ INTRAOCULAR LENS  IMPLANT, BILATERAL Bilateral    IR GENERIC HISTORICAL  12/30/2015   IR RADIOLOGIST EVAL & MGMT 12/30/2015 Aletta Edouard, MD [facility] INTERV RAD   IR GENERIC HISTORICAL  11/09/2016   IR  RADIOLOGIST EVAL & MGMT 11/09/2016 Aletta Edouard, MD [facility] INTERV RAD   IR RADIOLOGIST EVAL & MGMT  03/01/2017   IR RADIOLOGIST EVAL & MGMT  06/29/2017   LAPAROSCOPIC CHOLECYSTECTOMY  1999   LIVER BIOPSY  2010   RADIOACTIVE SEED IMPLANT  X 3   "to my liver"   TUMOR EXCISION  01/2009   Carcinoil Resection    TUMOR REMOVAL     from small intestine    Social History   Tobacco Use   Smoking status: Former    Packs/day: 1.50    Years: 35.00    Pack years: 52.50    Types: Cigarettes    Start date: 09/25/1959    Quit date: 12/21/1997    Years since quitting: 23.9   Smokeless tobacco: Never  Vaping Use   Vaping Use: Never used  Substance Use Topics   Alcohol use: Yes    Alcohol/week: 3.0 - 4.0 standard drinks    Types: 3 - 4 Cans of beer per week    Comment: 4 beers weekly    Drug use: No    Family History  Problem Relation Age of Onset   Kidney disease Mother    Hyperlipidemia Mother    Hypertension Mother    COPD Father    Ulcerative colitis Daughter     Allergies  Allergen Reactions   Iohexol Hives     Code: HIVES, Desc: PER [name] @ [facility]IMARY [facility]ARE, PT IS ALLERGIC TO CONTRAST DYE 10/02/08/RM  05/01/10...needs full premeds per our protocol w/ [facility]., Onset Date: [id number]     Medication list has been reviewed and updated.  Current Outpatient Medications on File Prior to Visit  Medication Sig Dispense Refill   aspirin EC 81 MG tablet Take 81 mg by mouth every morning.     atorvastatin (LIPITOR) 20 MG tablet TAKE 1 TABLET BY MOUTH DAILY AT 6 PM. 90 tablet 3   Cholecalciferol 1000 UNITS tablet Take 1,000 Units by mouth daily.     clopidogrel (PLAVIX) 75 MG tablet TAKE 1 TABLET BY MOUTH EVERY DAY 90 tablet 3   fluticasone (FLONASE) 50 MCG/ACT nasal spray Place 2 sprays into both nostrils daily as needed (sinuses).   11   hydrochlorothiazide (HYDRODIURIL) 25 MG tablet Take 0.5 tablets (12.5 mg total) by mouth daily. 45 tablet 3   hydrocortisone (ANUSOL-HC) 25 MG suppository  INSERT 1 SUPPOSITORY RECTALLY TWICE A DAY AS NEEDED FOR HEMORRHOIDS 30 suppository 0   ipratropium (ATROVENT) 0.03 % nasal spray Place 2 sprays into the nose 3 (three) times daily. 30 mL 6   Lanreotide Acetate (SOMATULINE DEPOT [place]) Inject 120 mcg into the skin every 28 (twenty-eight) days. Receives at Dr Antonieta Pert office     lansoprazole (PREVACID) 30 MG capsule Take 1 capsule (30 mg total) by mouth daily at 12 noon. 90 capsule 3   levothyroxine (SYNTHROID) 50 MCG tablet TAKE 1  TABLET BY MOUTH EVERY DAY BEFORE BREAKFAST 90 tablet 0   losartan (COZAAR) 100 MG tablet TAKE 1 TABLET BY MOUTH EVERY DAY 90 tablet 3   metoprolol tartrate (LOPRESSOR) 25 MG tablet TAKE 0.5 TABLETS BY MOUTH 2 TIMES DAILY. 90 tablet 0   nitroGLYCERIN (NITROSTAT) 0.4 MG SL tablet PLACE 1 TABLET UNDER THE TONGUE EVERY 5 (FIVE) MINUTES X 3 DOSES AS NEEDED FOR CHEST PAIN. 25 tablet 8   promethazine (PHENERGAN) 25 MG tablet Take 1 tablet (25 mg total) by mouth every 6 (six) hours as needed. 30 tablet 0   sucralfate (CARAFATE) 1 g tablet Take 1 tablet (1 g total) by mouth 4 (four) times daily -  with meals and at bedtime. 40 tablet 0   vitamin B-12 (CYANOCOBALAMIN) 1000 MCG tablet Take 1,000 mcg by mouth every evening.      No current facility-administered medications on file prior to visit.    Review of Systems:  As per HPI- otherwise negative.   Physical Examination: Vitals:   11/16/21 1530  BP: 126/60  Pulse: 86  Resp: 18  Temp: 97.9 F (36.6 C)  SpO2: 99%   Vitals:   11/16/21 1530  Weight: 198 lb 6.4 oz (90 kg)  Height: 5' 10.5" (1.791 m)   Body mass index is 28.07 kg/m. Ideal Body Weight: Weight in (lb) to have BMI = 25: 176.4  GEN: no acute distress.  Mild overweight, looks well HEENT: Atraumatic, Normocephalic.  Ears and Nose: No external deformity. CV: RRR, No M/G/R. No JVD. No thrill. No extra heart sounds. PULM: CTA B, no wheezes, crackles, rhonchi. No retractions. No resp. distress. No accessory  muscle use. ABD: S,ND, +BS. No rebound. No HSM.  Mild epigastric tenderness EXTR: No c/c/e PSYCH: Normally interactive. Conversant.    Assessment and Plan: Gastroesophageal reflux disease, unspecified whether esophagitis present  Patient is seen today for follow-up.  He has severe GERD and is seeing GI in 2 days.  He was recently seen at the ER. Offered encouragement, however otherwise I have nothing to add to current care plan.  He will contact me if anything is getting worse.  No charge for visit today  Signed Lamar Blinks, MD

## 2021-11-16 ENCOUNTER — Ambulatory Visit (INDEPENDENT_AMBULATORY_CARE_PROVIDER_SITE_OTHER): Payer: Medicare HMO | Admitting: Family Medicine

## 2021-11-16 VITALS — BP 126/60 | HR 86 | Temp 97.9°F | Resp 18 | Ht 70.5 in | Wt 198.4 lb

## 2021-11-16 DIAGNOSIS — K219 Gastro-esophageal reflux disease without esophagitis: Secondary | ICD-10-CM

## 2021-11-18 ENCOUNTER — Telehealth (INDEPENDENT_AMBULATORY_CARE_PROVIDER_SITE_OTHER): Payer: Medicare HMO | Admitting: Gastroenterology

## 2021-11-18 ENCOUNTER — Telehealth: Payer: Self-pay | Admitting: Gastroenterology

## 2021-11-18 ENCOUNTER — Encounter: Payer: Self-pay | Admitting: Gastroenterology

## 2021-11-18 VITALS — Ht 70.5 in | Wt 198.0 lb

## 2021-11-18 DIAGNOSIS — K219 Gastro-esophageal reflux disease without esophagitis: Secondary | ICD-10-CM

## 2021-11-18 DIAGNOSIS — Z7902 Long term (current) use of antithrombotics/antiplatelets: Secondary | ICD-10-CM

## 2021-11-18 DIAGNOSIS — R142 Eructation: Secondary | ICD-10-CM

## 2021-11-18 NOTE — Telephone Encounter (Signed)
[name], [place] are scheduled for a virtual visit with your provider today.    Just as we do with appointments in the office, we must obtain your consent to participate.  Your consent will be active for this visit and any virtual visit you may have with one of our providers in the next 365 days.    If you have a MyChart account, I can also send a copy of this consent to you electronically.  All virtual visits are billed to your insurance company just like a traditional visit in the office.  As this is a virtual visit, video technology does not allow for your provider to perform a traditional examination.  This may limit your provider's ability to fully assess your condition.  If your provider identifies any concerns that need to be evaluated in person or the need to arrange testing such as labs, EKG, etc, we will make arrangements to do so.    Although advances in technology are sophisticated, we cannot ensure that it will always work on either your end or our end.  If the connection with a video visit is poor, we may have to switch to a telephone visit.  With either a video or telephone visit, we are not always able to ensure that we have a secure connection.   I need to obtain your verbal consent now.   Are you willing to proceed with your visit today?   [name] has provided verbal consent on 11/18/2021 for a virtual visit (video or telephone).   [name]. [name], PA-C 11/18/2021  1:51 PM

## 2021-11-18 NOTE — Progress Notes (Signed)
11/18/2021 Brian Mays [id number] 1946-06-28   I connected with Brian Mays on November 18, 2021 at 1:30 PM by video enabled telemedicine application and verified that I am speaking with the correct person.  The patient and the provider were at separate locations during the entire encounter.  I discussed the limitations of evaluation and management by telemedicine and the availability of in person appointments.  Patient expressed understanding and agreed to proceed.  Patient and provided were at different sites during the entire visit.  Patient was at home and provider was at [facility] gastroenterology office  HISTORY OF PRESENT ILLNESS:  This is a 76 year old male with remote biliary pancreatitis complicated by pancreatic pseudocyst (1998), status post cholecystectomy, hypertension, hyperlipidemia, GERD, and carcinoid syndrome diagnosed 2010 for which he has undergone partial small bowel resection and remains on therapy under the guidance of oncology (Brian. Marin Mays).  He is a patient of Brian. Blanch Mays.  He is being seen today for complaints of worsening reflux symptoms.past several months.  Previously had just been on over-the-counter Nexium, but was placed on prescription Prevacid in summer or fall 2022 for worsening symptoms.  Over the past couple of weeks it has been even more so an issue with belching, burning in the chest, etc.  CT angio chest, abdomen, pelvis without contrast in the ED 5 days ago was unremarkable for cause of symptoms.  Prevacid was increased to twice daily and Carafate tablet was added to his regimen by his PCP just a couple of days ago.  Reports burning chest and a lot of belching.  He has not had an EGD in many years.  He reports that it all seems to feel better when he is having more regular bowel movements as well.  He said that he was having very small, firm bowel movements so he started MiraLAX recently over the past few days.  He says that he had a good bowel movement  yesterday and that seemed to make him feel better as well.  No dysphagia.  He is on Plavix daily for history of coronary artery disease, followed by Brian. Acie Mays.  Colonoscopy February 2017 at which time he had an inflammatory polyp removed.  Brian. Henrene Mays recommended repeat in 10 years   Past Medical History:  Diagnosis Date   Anemia    in past   Colon polyps    Diverticulosis    Gallstones    GERD (gastroesophageal reflux disease)    Heart attack ([place])    mild, Spring 2017   Heart murmur    Hemorrhoid    Hiatal hernia    HTN (hypertension)    Hypothyroidism    Iron deficiency anemia due to chronic blood loss 03/29/2019   Iron malabsorption 03/29/2019   Metastatic carcinoma ([place]) 2010   Brian Brian Mays   Neuroendocrine cancer [facility]) 08/2021   Pancreatitis 1998   chronic   Past Surgical History:  Procedure Laterality Date   APPENDECTOMY  1962   CARDIAC CATHETERIZATION N/A 02/10/2016   Procedure: Left Heart Cath and Coronary Angiography;  Surgeon: Brian Prows, MD;  Location: [place] [facility];  Service: Cardiovascular;  Laterality: N/A;   CARDIAC CATHETERIZATION  02/10/2016   Procedure: Coronary/Graft Atherectomy;  Surgeon: Brian Prows, MD;  Location: [place] [facility];  Service: Cardiovascular;;   CARDIAC CATHETERIZATION  02/10/2016   Procedure: Coronary Stent Intervention;  Surgeon: Brian Prows, MD;  Location: [place] [facility];  Service: Cardiovascular;;   CATARACT EXTRACTION W/  INTRAOCULAR LENS  IMPLANT, BILATERAL Bilateral    IR GENERIC HISTORICAL  12/30/2015   IR RADIOLOGIST EVAL & MGMT 12/30/2015 Brian Edouard, MD [facility] INTERV RAD   IR GENERIC HISTORICAL  11/09/2016   IR RADIOLOGIST EVAL & MGMT 11/09/2016 Brian Edouard, MD [facility] INTERV RAD   IR RADIOLOGIST EVAL & MGMT  03/01/2017   IR RADIOLOGIST EVAL & MGMT  06/29/2017   LAPAROSCOPIC CHOLECYSTECTOMY  1999   LIVER BIOPSY  2010   RADIOACTIVE SEED IMPLANT  X 3   "to my liver"   TUMOR EXCISION  01/2009   Carcinoil Resection    TUMOR  REMOVAL     from small intestine    reports that he quit smoking about 23 years ago. His smoking use included cigarettes. He started smoking about 62 years ago. He has a 52.50 pack-year smoking history. He has never used smokeless tobacco. He reports current alcohol use of about 3.0 - 4.0 standard drinks per week. He reports that he does not use drugs. family history includes COPD in his father; Hyperlipidemia in his mother; Hypertension in his mother; Kidney disease in his mother; Ulcerative colitis in his daughter. Allergies  Allergen Reactions   Iohexol Hives     Code: HIVES, Desc: PER [name] @ PRIMARY CARE, PT IS ALLERGIC TO CONTRAST DYE 10/02/08/RM  05/01/10...needs full premeds per our protocol w/ [facility]., Onset Date: 13244010       Outpatient Encounter Medications as of 11/18/2021  Medication Sig   aspirin EC 81 MG tablet Take 81 mg by mouth every morning.   atorvastatin (LIPITOR) 20 MG tablet TAKE 1 TABLET BY MOUTH DAILY AT 6 PM.   Cholecalciferol 1000 UNITS tablet Take 1,000 Units by mouth daily.   clopidogrel (PLAVIX) 75 MG tablet TAKE 1 TABLET BY MOUTH EVERY DAY   fluticasone (FLONASE) 50 MCG/ACT nasal spray Place 2 sprays into both nostrils daily as needed (sinuses).    hydrochlorothiazide (HYDRODIURIL) 25 MG tablet Take 0.5 tablets (12.5 mg total) by mouth daily.   hydrocortisone (ANUSOL-HC) 25 MG suppository INSERT 1 SUPPOSITORY RECTALLY TWICE A DAY AS NEEDED FOR HEMORRHOIDS   ipratropium (ATROVENT) 0.03 % nasal spray Place 2 sprays into the nose 3 (three) times daily.   Lanreotide Acetate (SOMATULINE DEPOT [place]) Inject 120 mcg into the skin every 28 (twenty-eight) days. Receives at Brian Mays office   lansoprazole (PREVACID) 30 MG capsule Take 1 capsule (30 mg total) by mouth daily at 12 noon.   levothyroxine (SYNTHROID) 50 MCG tablet TAKE 1 TABLET BY MOUTH EVERY DAY BEFORE BREAKFAST   losartan (COZAAR) 100 MG tablet TAKE 1 TABLET BY MOUTH EVERY DAY   metoprolol tartrate  (LOPRESSOR) 25 MG tablet TAKE 0.5 TABLETS BY MOUTH 2 TIMES DAILY.   nitroGLYCERIN (NITROSTAT) 0.4 MG SL tablet PLACE 1 TABLET UNDER THE TONGUE EVERY 5 (FIVE) MINUTES X 3 DOSES AS NEEDED FOR CHEST PAIN.   promethazine (PHENERGAN) 25 MG tablet Take 1 tablet (25 mg total) by mouth every 6 (six) hours as needed.   sucralfate (CARAFATE) 1 g tablet Take 1 tablet (1 g total) by mouth 4 (four) times daily -  with meals and at bedtime.   vitamin B-12 (CYANOCOBALAMIN) 1000 MCG tablet Take 1,000 mcg by mouth every evening.    No facility-administered encounter medications on file as of 11/18/2021.    REVIEW OF SYSTEMS  : All other systems reviewed and negative except where noted in the History of Present Illness.   PHYSICAL EXAM: Ht 5' 10.5" (1.791  m)    Wt 198 lb (89.8 kg)    BMI 28.01 kg/m  General: Well developed white male in no acute distress; A&O x 3.  ASSESSMENT AND PLAN: *76 year old male with complaints of worsening GERD symptoms over the past several months.  Previously had just been on over-the-counter Nexium, but was placed on prescription Prevacid in summer or fall 2022.  Over the past couple of weeks it has been even more so an issue with belching, burning in the chest, etc.  CT angio chest, abdomen, pelvis without contrast in the ED 5 days ago was unremarkable for cause of symptoms.  Prevacid was increased to twice daily and Carafate tablet was added to his regimen by his PCP just a couple of days ago.  He was advised to continue that regimen with the Prevacid 30 mg twice daily.  I advised him to take the Carafate tablet and mix it with a small amount of water and drink it in order to coat the esophagus and get more relief in that location with the medication.  He also notes improvement in his symptoms when he is having more regular bowel movements and recently began MiraLAX daily, which I also encouraged him to continue.  He has not had any EGD in many years.  We will plan for EGD with Brian.  Henrene Mays. *Chronic antiplatelet use with Plavix due to history of coronary artery disease:  Hold Plavix for 5 days before procedure - will instruct when and how to resume after procedure. Risks and benefits of procedure including bleeding, perforation, infection, missed lesions, medication reactions and possible hospitalization or surgery if complications occur explained. Additional rare but real risk of cardiovascular event such as heart attack or ischemia/infarct of other organs off Plavix explained and need to seek urgent help if this occurs. Will communicate by phone or EMR with patient's prescribing provider, Brian. Acie Mays, to confirm that holding Plavix is reasonable in this case.     CC:  Copland, Gay Filler, MD

## 2021-11-18 NOTE — Patient Instructions (Signed)
Continue Prevacid 40 mg twice daily.  Continue Carafate 4 times a day, but dissolve the pill in a small amount of liquid and drink it as a suspension/slurry.  Okay to use Tums or Pepcid as needed for now as well.  We are scheduling you for an EGD with Dr. Henrene Pastor.  Our office will contact you with an appointment.  We will obtain clearance from your cardiologist, Dr. Acie Fredrickson, in regards to holding your Plavix.

## 2021-11-18 NOTE — Progress Notes (Signed)
Assessment and plan as noted 

## 2021-11-18 NOTE — Telephone Encounter (Signed)
[facility] Pre-operative Risk Assessment     Request for surgical clearance:     Endoscopy Procedure  What type of surgery is being performed?     EGD   When is this surgery scheduled?     TBD  What type of clearance is required ?   Pharmacy  Are there any medications that need to be held prior to surgery and how long? Plavix for 5 days  Practice name and name of physician performing surgery?      [facility]  What is your office phone and fax number?      Phone- [phone]  [phone]  Anesthesia type (None, local, MAC, general) ?       MAC

## 2021-11-19 NOTE — Telephone Encounter (Signed)
Dr. Acie Fredrickson to review.  Last PCI was in 2017.  Patient was recently seen by Dr. Acie Fredrickson in December 2022 at which time he was doing well.  I left a message for the patient to call back and speak to the on-call preop APP of today.  Dr. Acie Fredrickson, as long as the patient does not have any recent chest pain or worsening dyspnea, would you be okay with them holding Plavix for 5 days prior to GI procedure?  Please forward your recommendation to P [facility] PREOP

## 2021-11-20 ENCOUNTER — Encounter: Payer: Self-pay | Admitting: *Deleted

## 2021-11-20 NOTE — Addendum Note (Signed)
Addended by: Larina Bras on: 11/20/2021 10:09 AM   Modules accepted: Orders

## 2021-11-20 NOTE — Telephone Encounter (Signed)
Pt is returning call.  

## 2021-11-20 NOTE — Progress Notes (Signed)
Patient has scheduled an appointment with Dr Henrene Pastor for endoscopy on 01/05/22. Instructions given and made available via mychart as well as [employer]. Patient also advised that he will need a care partner 48 or older to accompany him throughout procedure. Patient verbalizes understanding.

## 2021-11-23 ENCOUNTER — Encounter: Payer: Self-pay | Admitting: Internal Medicine

## 2021-11-23 NOTE — Telephone Encounter (Signed)
Left message for patient to call back 11/23/2021 at 9:32 AM.  Patient is call back.

## 2021-11-24 NOTE — Telephone Encounter (Signed)
The pt has been advised of the plavix hold 5 days prior to the procedure.  The pt has been advised of the information and verbalized understanding.

## 2021-11-27 ENCOUNTER — Other Ambulatory Visit: Payer: Self-pay | Admitting: Family Medicine

## 2021-11-27 DIAGNOSIS — K219 Gastro-esophageal reflux disease without esophagitis: Secondary | ICD-10-CM

## 2021-11-27 NOTE — Telephone Encounter (Signed)
Patient saw gastro on 1/18 video visit. He was advised to continue. Do you want to refill for more tabs?

## 2021-11-28 MED ORDER — SUCRALFATE 1 G PO TABS: 1.0000 g | ORAL_TABLET | Freq: Three times a day (TID) | ORAL | 0 refills | Status: DC

## 2021-12-01 ENCOUNTER — Other Ambulatory Visit: Payer: Self-pay

## 2021-12-01 ENCOUNTER — Inpatient Hospital Stay: Payer: Medicare HMO | Attending: Hematology & Oncology

## 2021-12-01 ENCOUNTER — Inpatient Hospital Stay: Payer: Medicare HMO

## 2021-12-01 ENCOUNTER — Inpatient Hospital Stay (HOSPITAL_BASED_OUTPATIENT_CLINIC_OR_DEPARTMENT_OTHER): Payer: Medicare HMO | Admitting: Hematology & Oncology

## 2021-12-01 ENCOUNTER — Encounter: Payer: Self-pay | Admitting: Hematology & Oncology

## 2021-12-01 VITALS — BP 111/91 | HR 75 | Temp 97.9°F | Resp 17 | Wt 197.8 lb

## 2021-12-01 DIAGNOSIS — C7B8 Other secondary neuroendocrine tumors: Secondary | ICD-10-CM

## 2021-12-01 DIAGNOSIS — D500 Iron deficiency anemia secondary to blood loss (chronic): Secondary | ICD-10-CM | POA: Diagnosis not present

## 2021-12-01 DIAGNOSIS — C7A8 Other malignant neuroendocrine tumors: Secondary | ICD-10-CM | POA: Insufficient documentation

## 2021-12-01 LAB — FERRITIN: Ferritin: 306 ng/mL (ref 24–336)

## 2021-12-01 LAB — CBC WITH DIFFERENTIAL (CANCER CENTER ONLY)
Abs Immature Granulocytes: 0.04 10*3/uL (ref 0.00–0.07)
Basophils Absolute: 0.1 10*3/uL (ref 0.0–0.1)
Basophils Relative: 1 %
Eosinophils Absolute: 0.4 10*3/uL (ref 0.0–0.5)
Eosinophils Relative: 7 %
HCT: 37.3 % — ABNORMAL LOW (ref 39.0–52.0)
Hemoglobin: 12.4 g/dL — ABNORMAL LOW (ref 13.0–17.0)
Immature Granulocytes: 1 %
Lymphocytes Relative: 12 %
Lymphs Abs: 0.7 10*3/uL (ref 0.7–4.0)
MCH: 30.7 pg (ref 26.0–34.0)
MCHC: 33.2 g/dL (ref 30.0–36.0)
MCV: 92.3 fL (ref 80.0–100.0)
Monocytes Absolute: 0.3 10*3/uL (ref 0.1–1.0)
Monocytes Relative: 5 %
Neutro Abs: 4.6 10*3/uL (ref 1.7–7.7)
Neutrophils Relative %: 74 %
Platelet Count: 133 10*3/uL — ABNORMAL LOW (ref 150–400)
RBC: 4.04 MIL/uL — ABNORMAL LOW (ref 4.22–5.81)
RDW: 13.5 % (ref 11.5–15.5)
WBC Count: 6.1 10*3/uL (ref 4.0–10.5)
nRBC: 0 % (ref 0.0–0.2)

## 2021-12-01 LAB — IRON AND IRON BINDING CAPACITY (CC-WL,HP ONLY)
Iron: 62 ug/dL (ref 45–182)
Saturation Ratios: 21 % (ref 17.9–39.5)
TIBC: 295 ug/dL (ref 250–450)
UIBC: 233 ug/dL (ref 117–376)

## 2021-12-01 LAB — CMP (CANCER CENTER ONLY)
ALT: 17 U/L (ref 0–44)
AST: 23 U/L (ref 15–41)
Albumin: 3.6 g/dL (ref 3.5–5.0)
Alkaline Phosphatase: 55 U/L (ref 38–126)
Anion gap: 8 (ref 5–15)
BUN: 18 mg/dL (ref 8–23)
CO2: 30 mmol/L (ref 22–32)
Calcium: 9.3 mg/dL (ref 8.9–10.3)
Chloride: 100 mmol/L (ref 98–111)
Creatinine: 1.01 mg/dL (ref 0.61–1.24)
GFR, Estimated: >60 mL/min (ref >60)
Glucose, Bld: 142 mg/dL — ABNORMAL HIGH (ref 70–99)
Potassium: 3.6 mmol/L (ref 3.5–5.1)
Sodium: 138 mmol/L (ref 135–145)
Total Bilirubin: 0.7 mg/dL (ref 0.3–1.2)
Total Protein: 7.5 g/dL (ref 6.5–8.1)

## 2021-12-01 MED ORDER — LANREOTIDE ACETATE 120 MG/0.5ML ~~LOC~~ SOLN
120.0000 mg | Freq: Once | SUBCUTANEOUS | Status: AC
  Administered 2021-12-01: 120 mg via SUBCUTANEOUS
  Filled 2021-12-01: qty 120

## 2021-12-01 NOTE — Progress Notes (Signed)
Hematology and Oncology Follow Up Visit  JAFARI MCKILLOP [id number] [birth year] 76 y.o. 12/01/2021   Principle Diagnosis:  Metastatic low grade neuroendocrine tumor-hepatic metastases Iron deficiency anemia   Past Therapy: S/p yttrium-90 intrahepatic therapy - November 2016 Lutathera (Lu 177) injection on 02/01/2018 - s/p cycle 4 SBRT to T6 lesion -- 11/06/2021   Current Therapy:        Somatuline 120 mg monthly IV Iron as indicated    Interim History:  Mr. Sidle is here today for follow-up.  He is happy to say that his wife is doing better.  She had a brain tumor.  This was partially resected.  She is undergoing radiation therapy along with Temodar I think.  So far, she has had no problems.  He says she is absolutely with improved.  I am happy for him.  He did undergo radiosurgery to the T6 lesion on 11/06/2021.  He tolerated this well.  His problem right now is reflux.  He sees Dr. Henrene Pastor of [facility] gastroenterology.  He is going to have an upper endoscopy in a month from now.  I was wondering if we might build to get this a little bit sooner.  He has not had diarrhea.  He has had no urinary issues.  His last chromogranin A level was stable at 114.  He has had no rashes.  There is been no issues with his heart.  He has had no problems with iron deficiency.  His last iron studies back in September showed a ferritin of 282 with an iron saturation of 24%.    Currently, I would say his performance status is probably ECOG 1.    Medications:  Allergies as of 12/01/2021       Reactions   Iohexol Hives    Code: HIVES, Desc: PER [name] @ PRIMARY CARE, PT IS ALLERGIC TO CONTRAST DYE 10/02/08/RM  05/01/10...needs full premeds per our protocol w/ [facility]., Onset Date: [id number]        Medication List        Accurate as of December 01, 2021  9:44 AM. If you have any questions, ask your nurse or doctor.          aspirin EC 81 MG tablet Take 81 mg by mouth every morning.    atorvastatin 20 MG tablet Commonly known as: LIPITOR TAKE 1 TABLET BY MOUTH DAILY AT 6 PM.   Cholecalciferol 25 MCG (1000 UT) tablet Take 1,000 Units by mouth daily.   clopidogrel 75 MG tablet Commonly known as: PLAVIX TAKE 1 TABLET BY MOUTH EVERY DAY   fluticasone 50 MCG/ACT nasal spray Commonly known as: FLONASE Place 2 sprays into both nostrils daily as needed (sinuses).   hydrochlorothiazide 25 MG tablet Commonly known as: HYDRODIURIL Take 0.5 tablets (12.5 mg total) by mouth daily.   hydrocortisone 25 MG suppository Commonly known as: ANUSOL-HC INSERT 1 SUPPOSITORY RECTALLY TWICE A DAY AS NEEDED FOR HEMORRHOIDS   ipratropium 0.03 % nasal spray Commonly known as: Atrovent Place 2 sprays into the nose 3 (three) times daily.   lansoprazole 30 MG capsule Commonly known as: PREVACID Take 1 capsule (30 mg total) by mouth daily at 12 noon.   levothyroxine 50 MCG tablet Commonly known as: SYNTHROID TAKE 1 TABLET BY MOUTH EVERY DAY BEFORE BREAKFAST   losartan 100 MG tablet Commonly known as: COZAAR TAKE 1 TABLET BY MOUTH EVERY DAY   metoprolol tartrate 25 MG tablet Commonly known as: LOPRESSOR TAKE 0.5 TABLETS BY MOUTH 2 TIMES  DAILY.   nitroGLYCERIN 0.4 MG SL tablet Commonly known as: NITROSTAT PLACE 1 TABLET UNDER THE TONGUE EVERY 5 (FIVE) MINUTES X 3 DOSES AS NEEDED FOR CHEST PAIN.   polyethylene glycol 17 g packet Commonly known as: MIRALAX / GLYCOLAX Take 17 g by mouth daily.   promethazine 25 MG tablet Commonly known as: PHENERGAN Take 1 tablet (25 mg total) by mouth every 6 (six) hours as needed.   SOMATULINE DEPOT [place] Inject 120 mcg into the skin every 28 (twenty-eight) days. Receives at Dr Antonieta Pert office   sucralfate 1 g tablet Commonly known as: Carafate Take 1 tablet (1 g total) by mouth 4 (four) times daily -  with meals and at bedtime.   vitamin B-12 1000 MCG tablet Commonly known as: CYANOCOBALAMIN Take 1,000 mcg by mouth every evening.         Allergies:  Allergies  Allergen Reactions   Iohexol Hives     Code: HIVES, Desc: PER [name] @ PRIMARY CARE, PT IS ALLERGIC TO CONTRAST DYE 10/02/08/RM  05/01/10...needs full premeds per our protocol w/ [facility]., Onset Date: [id number]     Past Medical History, Surgical history, Social history, and Family History were reviewed and updated.  Review of Systems: Review of Systems  Constitutional: Negative.   HENT: Negative.    Eyes: Negative.   Respiratory: Negative.    Cardiovascular: Negative.   Gastrointestinal: Negative.   Genitourinary: Negative.   Musculoskeletal: Negative.   Skin: Negative.   Neurological: Negative.   Endo/Heme/Allergies: Negative.   Psychiatric/Behavioral: Negative.      Physical Exam:  weight is 197 lb 12.8 oz (89.7 kg). His oral temperature is 97.9 F (36.6 C). His blood pressure is 111/91 (abnormal) and his pulse is 75. His respiration is 17 and oxygen saturation is 100%.   Wt Readings from Last 3 Encounters:  12/01/21 197 lb 12.8 oz (89.7 kg)  11/18/21 198 lb (89.8 kg)  11/16/21 198 lb 6.4 oz (90 kg)    Physical Exam Vitals reviewed.  HENT:     Head: Normocephalic and atraumatic.  Eyes:     Pupils: Pupils are equal, round, and reactive to light.  Cardiovascular:     Rate and Rhythm: Normal rate and regular rhythm.     Heart sounds: Normal heart sounds.  Pulmonary:     Effort: Pulmonary effort is normal.     Breath sounds: Normal breath sounds.  Abdominal:     General: Bowel sounds are normal.     Palpations: Abdomen is soft.  Musculoskeletal:        General: No tenderness or deformity. Normal range of motion.     Cervical back: Normal range of motion.  Lymphadenopathy:     Cervical: No cervical adenopathy.  Skin:    General: Skin is warm and dry.     Findings: No erythema or rash.  Neurological:     Mental Status: He is alert and oriented to person, place, and time.  Psychiatric:        Behavior: Behavior normal.         Thought Content: Thought content normal.        Judgment: Judgment normal.     Lab Results  Component Value Date   WBC 6.1 12/01/2021   HGB 12.4 (L) 12/01/2021   HCT 37.3 (L) 12/01/2021   MCV 92.3 12/01/2021   PLT 133 (L) 12/01/2021   Lab Results  Component Value Date   FERRITIN 282 07/23/2021   IRON 72 07/23/2021  TIBC 299 07/23/2021   UIBC 227 07/23/2021   IRONPCTSAT 24 07/23/2021   Lab Results  Component Value Date   RETICCTPCT 1.5 07/23/2021   RBC 4.04 (L) 12/01/2021   No results found for: KPAFRELGTCHN, LAMBDASER, KAPLAMBRATIO No results found for: IGGSERUM, IGA, IGMSERUM No results found for: Odetta Pink, SPEI   Chemistry      Component Value Date/Time   NA 138 12/01/2021 0845   NA 138 09/15/2018 0934   NA 144 10/04/2017 0904   NA 141 07/23/2016 1255   K 3.6 12/01/2021 0845   K 3.8 10/04/2017 0904   K 4.1 07/23/2016 1255   CL 100 12/01/2021 0845   CL 103 10/04/2017 0904   CO2 30 12/01/2021 0845   CO2 28 10/04/2017 0904   CO2 27 07/23/2016 1255   BUN 18 12/01/2021 0845   BUN 14 09/15/2018 0934   BUN 14 10/04/2017 0904   BUN 13.8 07/23/2016 1255   CREATININE 1.01 12/01/2021 0845   CREATININE 0.8 10/04/2017 0904   CREATININE 1.0 07/23/2016 1255      Component Value Date/Time   CALCIUM 9.3 12/01/2021 0845   CALCIUM 9.4 10/04/2017 0904   CALCIUM 9.2 07/23/2016 1255   ALKPHOS 55 12/01/2021 0845   ALKPHOS 64 10/04/2017 0904   ALKPHOS 77 07/23/2016 1255   AST 23 12/01/2021 0845   AST 23 07/23/2016 1255   ALT 17 12/01/2021 0845   ALT 22 10/04/2017 0904   ALT 13 07/23/2016 1255   BILITOT 0.7 12/01/2021 0845   BILITOT 1.13 07/23/2016 1255       Impression and Plan: Mr. Dunstan is a very pleasant 76 yo caucasian gentleman with metastatic low-grade neuroendocrine carcinoma with hepatic metastasis.    He completed radiation therapy to T6.  He was not symptomatic in that area but yet, I felt that  radiation to that area would be helpful.  I am happy that his wife is doing better.  Hopefully she will continue to have more stability.  We will see if Dr. Henrene Pastor of Gastroenterology can do the upper endoscopy sooner than March.  I will see him back in another month.     Volanda Napoleon, MD 1/31/20239:44 AM

## 2021-12-01 NOTE — Patient Instructions (Signed)
Lanreotide injection °What is this medication? °LANREOTIDE (lan REE oh tide) is used to reduce blood levels of growth hormone in patients with a condition called acromegaly. It also works to slow or stop tumor growth in patients with neuroendocrine tumors and treat carcinoid syndrome. °This medicine may be used for other purposes; ask your health care provider or pharmacist if you have questions. °COMMON BRAND NAME(S): Somatuline [place] °What should I tell my care team before I take this medication? °They need to know if you have any of these conditions: °diabetes °gallbladder disease °heart disease °kidney disease °liver disease °thyroid disease °an unusual or allergic reaction to lanreotide, other medicines, foods, dyes, or preservatives °pregnant or trying to get pregnant °breast-feeding °How should I use this medication? °This medicine is for injection under the skin. It is given by a health care professional in a hospital or clinic setting. °Contact your pediatrician or health care professional regarding the use of this medicine in children. Special care may be needed. °Overdosage: If you think you have taken too much of this medicine contact a poison control center or emergency room at once. °NOTE: This medicine is only for you. Do not share this medicine with others. °What if I miss a dose? °It is important not to miss your dose. Call your doctor or health care professional if you are unable to keep an appointment. °What may interact with this medication? °This medicine may interact with the following medications: °bromocriptine °cyclosporine °certain medicines for blood pressure, heart disease, irregular heart beat °certain medicines for diabetes °quinidine °terfenadine °This list may not describe all possible interactions. Give your health care provider a list of all the medicines, herbs, non-prescription drugs, or dietary supplements you use. Also tell them if you smoke, drink alcohol, or use illegal drugs.  Some items may interact with your medicine. °What should I watch for while using this medication? °Tell your doctor or healthcare professional if your symptoms do not start to get better or if they get worse. °Visit your doctor or health care professional for regular checks on your progress. Your condition will be monitored carefully while you are receiving this medicine. °This medicine may increase blood sugar. Ask your healthcare provider if changes in diet or medicines are needed if you have diabetes. °You may need blood work done while you are taking this medicine. °Women should inform their doctor if they wish to become pregnant or think they might be pregnant. There is a potential for serious side effects to an unborn child. Talk to your health care professional or pharmacist for more information. Do not breast-feed an infant while taking this medicine or for 6 months after stopping it. °This medicine has caused ovarian failure in some women. This medicine may interfere with the ability to have a child. Talk with your doctor or health care professional if you are concerned about your fertility. °What side effects may I notice from receiving this medication? °Side effects that you should report to your doctor or health care professional as soon as possible: °allergic reactions like skin rash, itching or hives, swelling of the face, lips, or tongue °increased blood pressure °severe stomach pain °signs and symptoms of hgh blood sugar such as being more thirsty or hungry or having to urinate more than normal. You may also feel very tired or have blurry vision. °signs and symptoms of low blood sugar such as feeling anxious; confusion; dizziness; increased hunger; unusually weak or tired; sweating; shakiness; cold; irritable; headache; blurred vision; fast   heartbeat; loss of consciousness °unusually slow heartbeat °Side effects that usually do not require medical attention (report to your doctor or health care  professional if they continue or are bothersome): °constipation °diarrhea °dizziness °headache °muscle pain °muscle spasms °nausea °pain, redness, or irritation at site where injected °This list may not describe all possible side effects. Call your doctor for medical advice about side effects. You may report side effects to FDA at 1-800-FDA-1088. °Where should I keep my medication? °This drug is given in a hospital or clinic and will not be stored at home. °NOTE: This sheet is a summary. It may not cover all possible information. If you have questions about this medicine, talk to your doctor, pharmacist, or health care provider. °© 2022 [employer]vier/Gold Standard (2018-09-06 00:00:00) ° °

## 2021-12-03 ENCOUNTER — Encounter: Payer: Self-pay | Admitting: Family Medicine

## 2021-12-03 LAB — CHROMOGRANIN A: Chromogranin A (ng/mL): 136.6 ng/mL — ABNORMAL HIGH (ref 0.0–101.8)

## 2021-12-03 NOTE — Progress Notes (Signed)
°  Radiation Oncology         [phone]) [phone] ________________________________  Name: Brian Mays MRN: [id number]  Date of Service: 12/07/2021  DOB: 1946-04-15  Post Treatment Telephone Note  Diagnosis:   Metastatic Neuroendocrine Tumor to Thoracic Spine levels T6 and T12  Intent: Palliative  Radiation Treatment Dates:  11/06/2021 through 11/06/2021 SRS Spine Site Technique Total Dose (Gy) Dose per Fx (Gy) Completed Fx Beam Energies  Thoracic Spine: Spine_T6 IMRT 18/18 18 1/1 6XFFF  Thoracic Spine: Spine_T12 IMRT 18/18 18 1/1 6XFFF   Narrative: The patient tolerated radiation therapy relatively well.     Impression/Plan: 1. Metastatic Neuroendocrine Tumor to Thoracic Spine levels T6 and T12. I was unable to reach the patient but left a voicemail and on the message, I We will plan to follow him in the spine oncology conference with surveillance MRIs which will be scheduled in early April 2022. He will continue follow up with Dr. Marin Olp as well.      Carola Rhine, PAC

## 2021-12-04 ENCOUNTER — Other Ambulatory Visit: Payer: Self-pay | Admitting: Cardiovascular Disease

## 2021-12-04 DIAGNOSIS — I10 Essential (primary) hypertension: Secondary | ICD-10-CM

## 2021-12-04 MED ORDER — PROMETHAZINE HCL 25 MG PO TABS: 25.0000 mg | ORAL_TABLET | Freq: Four times a day (QID) | ORAL | 0 refills | Status: DC | PRN

## 2021-12-04 NOTE — Telephone Encounter (Signed)
Are you okay with prescribing this?

## 2021-12-07 ENCOUNTER — Ambulatory Visit
Admission: RE | Admit: 2021-12-07 | Discharge: 2021-12-07 | Disposition: A | Discharge status: Home / Self Care | Payer: Medicare HMO | Source: Ambulatory Visit | Attending: Radiation Oncology | Admitting: Radiation Oncology

## 2021-12-07 DIAGNOSIS — C7B8 Other secondary neuroendocrine tumors: Secondary | ICD-10-CM | POA: Insufficient documentation

## 2021-12-07 MED ORDER — METOPROLOL TARTRATE 25 MG PO TABS: 12.5000 mg | ORAL_TABLET | Freq: Two times a day (BID) | ORAL | 3 refills | Status: DC

## 2021-12-09 ENCOUNTER — Telehealth: Payer: Self-pay

## 2021-12-09 NOTE — Telephone Encounter (Signed)
-----   Message from Irene Shipper, MD sent at 12/01/2021 10:44 AM EST ----- Brian Mays, As it stands today, March 7 is the first available appointment (I personally reviewed my schedule).  However, scheduled patients occasionally cancel.  We will watch closely for this.  Thanks for reaching out. Barbette Merino, This patient was seen by Janett Billow via telehealth medicine and set up for EGD for what is presumed to be refractory reflux despite being on multiple reflux meds.  He is on Plavix.  Please watch for any cancellations on my [place] schedule.  If so, we can evaluate [name], via EGD, sooner.  As far as I am concerned, this is a diagnostic EGD and he could stay on his Plavix for the examination.  Thanks. JP  ----- Message ----- From: Volanda Napoleon, MD Sent: 12/01/2021  10:06 AM EST To: Irene Shipper, MD  John: Is there any way you could do an upper endoscopy on him a lot sooner than March 7?  He really has a lot of reflux.  He is very uncomfortable.  He is losing weight.  I just hate to see him be miserable like this.  I know you are quite busy.  That is because you are so good and you been around the block a few times like I have.  I hope all is well with you and your family.  Hope that you have a wonderful 2023.  It is always such a pleasure working with you and your patients.  Laurey Arrow

## 2021-12-09 NOTE — Telephone Encounter (Signed)
Cancellation for tomorrow. Pts EGD rescheduled to 12/10/21 at 10am, pt to arrive at 9am. He knows he does not have to stop plavix per Dr. Henrene Pastor. See note below.

## 2021-12-10 ENCOUNTER — Encounter: Payer: Self-pay | Admitting: Internal Medicine

## 2021-12-10 ENCOUNTER — Other Ambulatory Visit: Payer: Self-pay

## 2021-12-10 ENCOUNTER — Telehealth: Payer: Self-pay | Admitting: Cardiovascular Disease

## 2021-12-10 ENCOUNTER — Other Ambulatory Visit: Payer: Self-pay | Admitting: Radiation Therapy

## 2021-12-10 ENCOUNTER — Ambulatory Visit (AMBULATORY_SURGERY_CENTER): Payer: Medicare HMO | Admitting: Internal Medicine

## 2021-12-10 ENCOUNTER — Encounter: Payer: Self-pay | Admitting: Family

## 2021-12-10 VITALS — BP 101/50 | HR 56 | Temp 97.1°F | Resp 10 | Ht 70.5 in | Wt 198.0 lb

## 2021-12-10 DIAGNOSIS — R142 Eructation: Secondary | ICD-10-CM | POA: Diagnosis not present

## 2021-12-10 DIAGNOSIS — C7951 Secondary malignant neoplasm of bone: Secondary | ICD-10-CM

## 2021-12-10 DIAGNOSIS — K219 Gastro-esophageal reflux disease without esophagitis: Secondary | ICD-10-CM

## 2021-12-10 DIAGNOSIS — E039 Hypothyroidism, unspecified: Secondary | ICD-10-CM | POA: Diagnosis not present

## 2021-12-10 DIAGNOSIS — R1013 Epigastric pain: Secondary | ICD-10-CM | POA: Diagnosis not present

## 2021-12-10 DIAGNOSIS — I10 Essential (primary) hypertension: Secondary | ICD-10-CM | POA: Diagnosis not present

## 2021-12-10 MED ORDER — SODIUM CHLORIDE 0.9 % IV SOLN: 500.0000 mL | Freq: Once | INTRAVENOUS | Status: DC

## 2021-12-10 NOTE — Progress Notes (Signed)
Per Dr. Henrene Pastor called Dr. Elmarie Shiley office to schedule appointment for patient to be seen ASAP to be evaluated for his symptoms and also possible EKG changes.  Strip sent home with patient.  Spoke with Triage nurse Judeen Hammans.  They will be calling patient to schedule.  Instructed patient to go to ED should he have any chest pain or SOB.  Faxed a copy of the strip over to cardiology.

## 2021-12-10 NOTE — Op Note (Signed)
[place] Patient Name: Brian Mays Procedure Date: 12/10/2021 10:09 AM MRN: [id number] Endoscopist: Docia Chuck. Henrene Pastor , MD Age: 76 Referring MD:  Date of Birth: [birth year] Gender: Male Account #: [id number] Procedure:                Upper GI endoscopy Indications:              Dyspepsia. Describes problems with belching                            discomfort which has an exertional component Medicines:                Monitored Anesthesia Care Procedure:                Pre-Anesthesia Assessment:                           - Prior to the procedure, a History and Physical                            was performed, and patient medications and                            allergies were reviewed. The patient's tolerance of                            previous anesthesia was also reviewed. The risks                            and benefits of the procedure and the sedation                            options and risks were discussed with the patient.                            All questions were answered, and informed consent                            was obtained. Prior Anticoagulants: The patient has                            taken Plavix (clopidogrel), last dose was day of                            procedure. ASA Grade Assessment: III - A patient                            with severe systemic disease. After reviewing the                            risks and benefits, the patient was deemed in                            satisfactory condition to undergo the procedure.  After obtaining informed consent, the endoscope was                            passed under direct vision. Throughout the                            procedure, the patient's blood pressure, pulse, and                            oxygen saturations were monitored continuously. The                            GIF [id number] #[id number] was introduced through the                            mouth, and  advanced to the second part of duodenum.                            The upper GI endoscopy was accomplished without                            difficulty. The patient tolerated the procedure                            well. Scope In: Scope Out: Findings:                 The esophagus was normal.                           The stomach was normal.                           The examined duodenum was normal.                           The cardia and gastric fundus were normal on                            retroflexion. Complications:            No immediate complications. Estimated Blood Loss:     Estimated blood loss: none. Impression:               1. Normal EGD                           2. Dyspeptic symptoms persist despite aggressive                            treatment for reflux, which should be adequate for                            reflux.                           3. Exertional component to dyspeptic symptoms. Rule  out cardiac equivalent. Recommendation:           1. Patient has a contact number available for                            emergencies. The signs and symptoms of potential                            delayed complications were discussed with the                            patient. Return to normal activities tomorrow.                            Written discharge instructions were provided to the                            patient.                           2. Resume previous diet.                           3. Continue present medications.                           4. Please see your cardiologist ASAP for evaluation                            regarding her symptoms Fanny Agan N. Henrene Pastor, MD 12/10/2021 10:34:08 AM This report has been signed electronically.

## 2021-12-10 NOTE — Telephone Encounter (Signed)
Received secure chat from triage today regarding this patient needing an appt ASAP and to schedule with DOD for tomorrow or Monday. Spoke to the patient's wife and scheduled patient for tomorrow 12/11/21 at 11:30am with Dr. Ali Lowe.

## 2021-12-10 NOTE — Telephone Encounter (Signed)
The Endoscopy Dept (Dr. Henrene Pastor) called to talk with triage about patient. Sent call to triage.

## 2021-12-10 NOTE — Telephone Encounter (Signed)
Will forward this message to the pts General Cardiologist Dr. Acie Fredrickson, as a general FYI, to make him aware of this plan.

## 2021-12-10 NOTE — Patient Instructions (Signed)
Normal exam.  Please see cardiologist ASAP for evaluation of symptoms.   YOU HAD AN ENDOSCOPIC PROCEDURE TODAY AT [facility]:   Refer to the procedure report that was given to you for any specific questions about what was found during the examination.  If the procedure report does not answer your questions, please call your gastroenterologist to clarify.  If you requested that your care partner not be given the details of your procedure findings, then the procedure report has been included in a sealed envelope for you to review at your convenience later.  YOU SHOULD EXPECT: Some feelings of bloating in the abdomen. Passage of more gas than usual.  Walking can help get rid of the air that was put into your GI tract during the procedure and reduce the bloating. If you had a lower endoscopy (such as a colonoscopy or flexible sigmoidoscopy) you may notice spotting of blood in your stool or on the toilet paper. If you underwent a bowel prep for your procedure, you may not have a normal bowel movement for a few days.  Please Note:  You might notice some irritation and congestion in your nose or some drainage.  This is from the oxygen used during your procedure.  There is no need for concern and it should clear up in a day or so.  SYMPTOMS TO REPORT IMMEDIATELY:   Following upper endoscopy (EGD)  Vomiting of blood or coffee ground material  New chest pain or pain under the shoulder blades  Painful or persistently difficult swallowing  New shortness of breath  Fever of 100F or higher  Black, tarry-looking stools  For urgent or emergent issues, a gastroenterologist can be reached at any hour by calling [phone]. Do not use MyChart messaging for urgent concerns.    DIET:  We do recommend a small meal at first, but then you may proceed to your regular diet.  Drink plenty of fluids but you should avoid alcoholic beverages for 24 hours.  ACTIVITY:  You should plan to take it easy  for the rest of today and you should NOT DRIVE or use heavy machinery until tomorrow (because of the sedation medicines used during the test).    FOLLOW UP: Our staff will call the number listed on your records 48-72 hours following your procedure to check on you and address any questions or concerns that you may have regarding the information given to you following your procedure. If we do not reach you, we will leave a message.  We will attempt to reach you two times.  During this call, we will ask if you have developed any symptoms of COVID 19. If you develop any symptoms (ie: fever, flu-like symptoms, shortness of breath, cough etc.) before then, please call [phone].  If you test positive for Covid 19 in the 2 weeks post procedure, please call and report this information to [place].    If any biopsies were taken you will be contacted by phone or by letter within the next 1-3 weeks.  Please call us at [phone] if you have not heard about the biopsies in 3 weeks.    SIGNATURES/CONFIDENTIALITY: You and/or your care partner have signed paperwork which will be entered into your electronic medical record.  These signatures attest to the fact that that the information above on your After Visit Summary has been reviewed and is understood.  Full responsibility of the confidentiality of this discharge information lies with you and/or your care-partner.

## 2021-12-10 NOTE — Telephone Encounter (Addendum)
Natalie from [facility] calls in on behalf of Dr. Henrene Pastor. Endoscopy came back normal. Pt did have some EKG changes, ST depression, and MD wanted pt seen ASAP by cardiology.  Would like pt seen by early next week if cannot get pt in tomorrow. Aware office will call to arrange.  Natalie faxing strips to office for appt.  Forwarding to scheduling team to reach out to pt today and schedule tomorrow with DOD. Also sent secure chat message to scheduling team to put on their radar to reach out to pt today.

## 2021-12-10 NOTE — Addendum Note (Signed)
Addended by: Pincus Large on: 12/10/2021 11:42 AM   Modules accepted: Orders

## 2021-12-10 NOTE — Progress Notes (Signed)
To PACU, VSS. Report to [email or web address] 

## 2021-12-10 NOTE — Progress Notes (Signed)
11/18/2021 Brian Mays [id number] Jul 07, 1946     I connected with [name] on November 18, 2021 at 1:30 PM by video enabled telemedicine application and verified that I am speaking with the correct person.  The patient and the provider were at separate locations during the entire encounter.  I discussed the limitations of evaluation and management by telemedicine and the availability of in person appointments.  Patient expressed understanding and agreed to proceed.  Patient and provided were at different sites during the entire visit.  Patient was at home and provider was at [facility] gastroenterology office   HISTORY OF PRESENT ILLNESS:  This is a 76 year old male with remote biliary pancreatitis complicated by pancreatic pseudocyst (1998), status post cholecystectomy, hypertension, hyperlipidemia, GERD, and carcinoid syndrome diagnosed 2010 for which he has undergone partial small bowel resection and remains on therapy under the guidance of oncology (Dr. Marin Olp).  He is a patient of Dr. Blanch Media.  He is being seen today for complaints of worsening reflux symptoms.past several months.  Previously had just been on over-the-counter Nexium, but was placed on prescription Prevacid in summer or fall 2022 for worsening symptoms.  Over the past couple of weeks it has been even more so an issue with belching, burning in the chest, etc.  CT angio chest, abdomen, pelvis without contrast in the ED 5 days ago was unremarkable for cause of symptoms.  Prevacid was increased to twice daily and Carafate tablet was added to his regimen by his PCP just a couple of days ago.  Reports burning chest and a lot of belching.  He has not had an EGD in many years.  He reports that it all seems to feel better when he is having more regular bowel movements as well.  He said that he was having very small, firm bowel movements so he started MiraLAX recently over the past few days.  He says that he had a good bowel movement yesterday  and that seemed to make him feel better as well.  No dysphagia.   He is on Plavix daily for history of coronary artery disease, followed by Dr. Acie Fredrickson.   Colonoscopy February 2017 at which time he had an inflammatory polyp removed.  Dr. Henrene Pastor recommended repeat in 10 years         Past Medical History:  Diagnosis Date   Anemia      in past   Colon polyps     Diverticulosis     Gallstones     GERD (gastroesophageal reflux disease)     Heart attack ([place])      mild, Spring 2017   Heart murmur     Hemorrhoid     Hiatal hernia     HTN (hypertension)     Hypothyroidism     Iron deficiency anemia Mays to chronic blood loss 03/29/2019   Iron malabsorption 03/29/2019   Metastatic carcinoma ([place]) 2010    Dr Jonette Eva   Neuroendocrine cancer [name]) 08/2021   Pancreatitis 1998    chronic         Past Surgical History:  Procedure Laterality Date   APPENDECTOMY   1962   CARDIAC CATHETERIZATION N/A 02/10/2016    Procedure: Left Heart Cath and Coronary Angiography;  Surgeon: Adrian Prows, MD;  Location: [facility];  Service: Cardiovascular;  Laterality: N/A;   CARDIAC CATHETERIZATION   02/10/2016    Procedure: Coronary/Graft Atherectomy;  Surgeon: Adrian Prows, MD;  Location: [facility];  Service: Cardiovascular;;   CARDIAC CATHETERIZATION   02/10/2016    Procedure: Coronary Stent Intervention;  Surgeon: Adrian Prows, MD;  Location: [facility];  Service: Cardiovascular;;   CATARACT EXTRACTION W/ INTRAOCULAR LENS  IMPLANT, BILATERAL Bilateral     IR GENERIC HISTORICAL   12/30/2015    IR RADIOLOGIST EVAL & MGMT 12/30/2015 Aletta Edouard, MD [facility] INTERV RAD   IR GENERIC HISTORICAL   11/09/2016    IR RADIOLOGIST EVAL & MGMT 11/09/2016 Aletta Edouard, MD [facility] INTERV RAD   IR RADIOLOGIST EVAL & MGMT   03/01/2017   IR RADIOLOGIST EVAL & MGMT   06/29/2017   LAPAROSCOPIC CHOLECYSTECTOMY   1999   LIVER BIOPSY   2010   RADIOACTIVE SEED IMPLANT   X 3    "to my liver"   TUMOR EXCISION    01/2009    Carcinoil Resection    TUMOR REMOVAL        from small intestine     reports that he quit smoking about 23 years ago. His smoking use included cigarettes. He started smoking about 62 years ago. He has a 52.50 pack-year smoking history. He has never used smokeless tobacco. He reports current alcohol use of about 3.0 - 4.0 standard drinks per week. He reports that he does not use drugs. family history includes COPD in his father; Hyperlipidemia in his mother; Hypertension in his mother; Kidney disease in his mother; Ulcerative colitis in his daughter.      Allergies  Allergen Reactions   Iohexol Hives       Code: HIVES, Desc: PER [name] @ PRIMARY CARE, PT IS ALLERGIC TO CONTRAST DYE 10/02/08/RM  05/01/10...needs full premeds per our protocol w/ [facility]., Onset Date: [id number]              Outpatient Encounter Medications as of 11/18/2021  Medication Sig   aspirin EC 81 MG tablet Take 81 mg by mouth every morning.   atorvastatin (LIPITOR) 20 MG tablet TAKE 1 TABLET BY MOUTH DAILY AT 6 PM.   Cholecalciferol 1000 UNITS tablet Take 1,000 Units by mouth daily.   clopidogrel (PLAVIX) 75 MG tablet TAKE 1 TABLET BY MOUTH EVERY DAY   fluticasone (FLONASE) 50 MCG/ACT nasal spray Place 2 sprays into both nostrils daily as needed (sinuses).    hydrochlorothiazide (HYDRODIURIL) 25 MG tablet Take 0.5 tablets (12.5 mg total) by mouth daily.   hydrocortisone (ANUSOL-HC) 25 MG suppository INSERT 1 SUPPOSITORY RECTALLY TWICE A DAY AS NEEDED FOR HEMORRHOIDS   ipratropium (ATROVENT) 0.03 % nasal spray Place 2 sprays into the nose 3 (three) times daily.   Lanreotide Acetate (SOMATULINE DEPOT [place]) Inject 120 mcg into the skin every 28 (twenty-eight) days. Receives at Dr Antonieta Pert office   lansoprazole (PREVACID) 30 MG capsule Take 1 capsule (30 mg total) by mouth daily at 12 noon.   levothyroxine (SYNTHROID) 50 MCG tablet TAKE 1 TABLET BY MOUTH EVERY DAY BEFORE BREAKFAST   losartan (COZAAR) 100 MG tablet  TAKE 1 TABLET BY MOUTH EVERY DAY   metoprolol tartrate (LOPRESSOR) 25 MG tablet TAKE 0.5 TABLETS BY MOUTH 2 TIMES DAILY.   nitroGLYCERIN (NITROSTAT) 0.4 MG SL tablet PLACE 1 TABLET UNDER THE TONGUE EVERY 5 (FIVE) MINUTES X 3 DOSES AS NEEDED FOR CHEST PAIN.   promethazine (PHENERGAN) 25 MG tablet Take 1 tablet (25 mg total) by mouth every 6 (six) hours as needed.   sucralfate (CARAFATE) 1 g tablet Take 1 tablet (1 g total) by mouth 4 (four) times  daily -  with meals and at bedtime.   vitamin B-12 (CYANOCOBALAMIN) 1000 MCG tablet Take 1,000 mcg by mouth every evening.     No facility-administered encounter medications on file as of 11/18/2021.      REVIEW OF SYSTEMS  : All other systems reviewed and negative except where noted in the History of Present Illness.     PHYSICAL EXAM: Ht 5' 10.5" (1.791 m)    Wt 198 lb (89.8 kg)    BMI 28.01 kg/m  General: Well developed white male in no acute distress; A&O x 3.   ASSESSMENT AND PLAN: *76 year old male with complaints of worsening GERD symptoms over the past several months.  Previously had just been on over-the-counter Nexium, but was placed on prescription Prevacid in summer or fall 2022.  Over the past couple of weeks it has been even more so an issue with belching, burning in the chest, etc.  CT angio chest, abdomen, pelvis without contrast in the ED 5 days ago was unremarkable for cause of symptoms.  Prevacid was increased to twice daily and Carafate tablet was added to his regimen by his PCP just a couple of days ago.  He was advised to continue that regimen with the Prevacid 30 mg twice daily.  I advised him to take the Carafate tablet and mix it with a small amount of water and drink it in order to coat the esophagus and get more relief in that location with the medication.  He also notes improvement in his symptoms when he is having more regular bowel movements and recently began MiraLAX daily, which I also encouraged him to continue.  He has not  had any EGD in many years.  We will plan for EGD with Dr. Henrene Pastor. *Chronic antiplatelet use with Plavix [place] to history of coronary artery disease:  Hold Plavix for 5 days before procedure - will instruct when and how to resume after procedure. Risks and benefits of procedure including bleeding, perforation, infection, missed lesions, medication reactions and possible hospitalization or surgery if complications occur explained. Additional rare but real risk of cardiovascular event such as heart attack or ischemia/infarct of other organs off Plavix explained and need to seek urgent help if this occurs. Will communicate by phone or EMR with patient's prescribing provider, Dr. Acie Fredrickson, to confirm that holding Plavix is reasonable in this case.

## 2021-12-11 ENCOUNTER — Ambulatory Visit: Payer: Medicare HMO | Admitting: Internal Medicine

## 2021-12-11 ENCOUNTER — Encounter: Payer: Self-pay | Admitting: Internal Medicine

## 2021-12-11 ENCOUNTER — Other Ambulatory Visit: Payer: Self-pay | Admitting: Radiation Therapy

## 2021-12-11 VITALS — BP 126/60 | HR 67 | Ht 70.5 in | Wt 199.4 lb

## 2021-12-11 DIAGNOSIS — I25118 Atherosclerotic heart disease of native coronary artery with other forms of angina pectoris: Secondary | ICD-10-CM | POA: Diagnosis not present

## 2021-12-11 MED ORDER — ISOSORBIDE MONONITRATE ER 30 MG PO TB24: 30.0000 mg | ORAL_TABLET | Freq: Every day | ORAL | 3 refills | Status: DC

## 2021-12-11 MED ORDER — PREDNISONE 50 MG PO TABS: ORAL_TABLET | ORAL | 0 refills | Status: DC

## 2021-12-11 NOTE — Patient Instructions (Signed)
Medication Instructions:  Your physician has recommended you make the following change in your medication: start Isosorbide mononitrate 30 mg by mouth daily  *If you need a refill on your cardiac medications before your next appointment, please call your pharmacy*   Lab Work: Lab work to be done today--CBC and BMP If you have labs (blood work) drawn today and your tests are completely normal, you will receive your results only by: [place] (if you have MyChart) OR A paper copy in the mail If you have any lab test that is abnormal or we need to change your treatment, we will call you to review the results.   Testing/Procedures: Your physician has requested that you have a cardiac catheterization. Cardiac catheterization is used to diagnose and/or treat various heart conditions. Doctors may recommend this procedure for a number of different reasons. The most common reason is to evaluate chest pain. Chest pain can be a symptom of coronary artery disease (CAD), and cardiac catheterization can show whether plaque is narrowing or blocking your hearts arteries. This procedure is also used to evaluate the valves, as well as measure the blood flow and oxygen levels in different parts of your heart. For further information please visit [email or web address]. Please follow instruction sheet, as given. Scheduled for 12/16/21   Follow-Up: At [facility], you and your health needs are our priority.  As part of our continuing mission to provide you with exceptional heart care, we have created designated Provider Care Teams.  These Care Teams include your primary Cardiologist (physician) and Advanced Practice Providers (APPs -  Physician Assistants and Nurse Practitioners) who all work together to provide you with the care you need, when you need it.  We recommend signing up for the patient portal called "MyChart".  Sign up information is provided on this After Visit Summary.  MyChart is used to  connect with patients for Virtual Visits (Telemedicine).  Patients are able to view lab/test results, encounter notes, upcoming appointments, etc.  Non-urgent messages can be sent to your provider as well.   To learn more about what you can do with MyChart, go to [email or web address].    Your next appointment:   2 week(s)  The format for your next appointment:   In Person  Provider:   Robbie Lis, PA-C, Nicholes Rough, PA-C, Melina Copa, PA-C, Cecilie Kicks, NP, Ermalinda Barrios, PA-C, Christen Bame, NP, or Richardson Dopp, [place]         Other Instructions  [facility] OFFICE [place], [place] [address] Dept: [place]: [place]  12/11/2021  You are scheduled for a Cardiac Catheterization on Wednesday, February 15 with Dr. Lenna Sciara.  1. Please arrive at the [facility] (Main Entrance A) at [facility]: [address] [place], [place] [address] at 11:00 AM (This time is two hours before your procedure to ensure your preparation). Free valet parking service is available.   Special note: Every effort is made to have your procedure done on time. Please understand that emergencies sometimes delay scheduled procedures.  2. Diet: Do not eat solid foods after midnight.  The patient may have clear liquids until 5am upon the day of the procedure.  3. Labs: done in office on 12/16/21  4. Medication instructions in preparation for your procedure:   Contrast Allergy: Yes, Please take Prednisone 50mg  by mouth at: Thirteen hours prior to cath 12:00 AM (midnight) on  Wednesday Seven hours prior to cath 6:00am on Wednesday And prior to leaving home please take last dose of Prednisone 50mg  and Benadryl 50mg  by mouth.  Do not take hydrochlorothiazide the morning of the procedure   On the morning of your procedure, take your Aspirin and Plavix/Clopidogrel and any  morning medicines NOT listed above.  You may use sips of water.  5. Plan for one night stay--bring personal belongings. 6. Bring a current list of your medications and current insurance cards. 7. You MUST have a responsible person to drive you home. 8. Someone MUST be with you the first 24 hours after you arrive home or your discharge will be delayed. 9. Please wear clothes that are easy to get on and off and wear slip-on shoes.  Thank you for allowing [place] to care for you!   -- [facility]

## 2021-12-11 NOTE — Progress Notes (Signed)
Cardiology Office Note:    Date:  12/11/2021   ID:  JHADEN PIZZUTO, DOB 04/09/1946, MRN [id number]  PCP:  Darreld Mclean, MD   [facility] Providers Cardiologist:  Lenna Sciara, MD Referring MD: Darreld Mclean, MD   Chief Complaint/Reason for Referral:  ST depressions during endoscopy  ASSESSMENT:    Coronary artery disease involving native coronary artery of native heart with refractory angina pectoris ([place])    PLAN:    In order of problems listed above:  I suspect the patient has developed restenosis of his right coronary artery stents.  We will start Imdur 30 mg.  I will refer the patient for coronary angiography study given the fact he is on 2 antianginals without relief.  Further recommendations will follow the results of this testing.  I will have the patient follow-up in this office in 2 weeks.  Dispo:  No follow-ups on file.     Medication Adjustments/Labs and Tests Ordered: Current medicines are reviewed at length with the patient today.  Concerns regarding medicines are outlined above.   Tests Ordered: No orders of the defined types were placed in this encounter.   Medication Changes: No orders of the defined types were placed in this encounter.   History of Present Illness:    FOCUSED CARDIOVASCULAR PROBLEM LIST:   1.  Coronary artery disease status post rotational atherectomy and stenting of the RCA with mild LAD disease 2017 2.  Hypertension 3.  Hyperlipidemia 4.  Hypothyroidism 5.  IV dye allergy  The patient is a 76 y.o. male with the indicated medical history here for expedited visit given normal EGD and symptoms of dyspepsia as well as transient ST depressions during the procedure (I do not have records regarding the EKG changes).  I am seeing the patient as a doctor of the day as the patient is normally cared for by Dr. Acie Fredrickson.  The patient tells me that he has had ongoing belching which has been worse over the last summer.  He  initially thought this was related to his gastrointestinal system and he underwent an EGD yesterday which was negative.  Interestingly he tells me that when he exerts himself he will develop belching as well.  He is on as needed nitroglycerin and metoprolol as antianginals.  This does not seem to be controlling his pain.  Fortunately he has required no emergency room visits or hospitalizations.  Previous Medical History: Past Medical History:  Diagnosis Date   Anemia    in past   Cataract    Colon polyps    Diverticulosis    Gallstones    GERD (gastroesophageal reflux disease)    Heart attack ([place])    mild, Spring 2017   Heart murmur    Hemorrhoid    Hiatal hernia    HTN (hypertension)    Hyperlipidemia    Hypothyroidism    Iron deficiency anemia due to chronic blood loss 03/29/2019   Iron malabsorption 03/29/2019   Metastatic carcinoma ([place]) 2010   Dr Jonette Eva   Neuroendocrine cancer [facility]) 08/2021   Pancreatitis 1998   chronic     Current Medications: Current Meds  Medication Sig   aspirin EC 81 MG tablet Take 81 mg by mouth every morning.   atorvastatin (LIPITOR) 20 MG tablet TAKE 1 TABLET BY MOUTH DAILY AT 6 PM.   Cholecalciferol 1000 UNITS tablet Take 1,000 Units by mouth daily.   clopidogrel (PLAVIX) 75 MG tablet TAKE 1 TABLET BY MOUTH EVERY DAY  fluticasone (FLONASE) 50 MCG/ACT nasal spray Place 2 sprays into both nostrils daily as needed (sinuses).    hydrochlorothiazide (HYDRODIURIL) 25 MG tablet Take 0.5 tablets (12.5 mg total) by mouth daily.   hydrocortisone (ANUSOL-HC) 25 MG suppository INSERT 1 SUPPOSITORY RECTALLY TWICE A DAY AS NEEDED FOR HEMORRHOIDS   ipratropium (ATROVENT) 0.03 % nasal spray Place 2 sprays into the nose 3 (three) times daily.   Lanreotide Acetate (SOMATULINE DEPOT [place]) Inject 120 mcg into the skin every 28 (twenty-eight) days. Receives at Dr Antonieta Pert office   lansoprazole (PREVACID) 30 MG capsule Take 1 capsule (30 mg total) by mouth daily at  12 noon.   levothyroxine (SYNTHROID) 50 MCG tablet TAKE 1 TABLET BY MOUTH EVERY DAY BEFORE BREAKFAST   losartan (COZAAR) 100 MG tablet TAKE 1 TABLET BY MOUTH EVERY DAY   metoprolol tartrate (LOPRESSOR) 25 MG tablet Take 0.5 tablets (12.5 mg total) by mouth 2 (two) times daily.   nitroGLYCERIN (NITROSTAT) 0.4 MG SL tablet PLACE 1 TABLET UNDER THE TONGUE EVERY 5 (FIVE) MINUTES X 3 DOSES AS NEEDED FOR CHEST PAIN.   polyethylene glycol (MIRALAX / GLYCOLAX) 17 g packet Take 17 g by mouth daily.   promethazine (PHENERGAN) 25 MG tablet Take 1 tablet (25 mg total) by mouth every 6 (six) hours as needed.   sucralfate (CARAFATE) 1 g tablet Take 1 tablet (1 g total) by mouth 4 (four) times daily -  with meals and at bedtime.   vitamin B-12 (CYANOCOBALAMIN) 1000 MCG tablet Take 1,000 mcg by mouth every evening.      Allergies:    Iohexol   Social History:   Social History   Tobacco Use   Smoking status: Former    Packs/day: 1.50    Years: 35.00    Pack years: 52.50    Types: Cigarettes    Start date: 09/25/1959    Quit date: 12/21/1997    Years since quitting: 23.9   Smokeless tobacco: Never  Vaping Use   Vaping Use: Never used  Substance Use Topics   Alcohol use: Yes    Alcohol/week: 3.0 - 4.0 standard drinks    Types: 3 - 4 Cans of beer per week    Comment: 4 beers weekly    Drug use: No     Family Hx: Family History  Problem Relation Age of Onset   Kidney disease Mother    Hyperlipidemia Mother    Hypertension Mother    COPD Father    Ulcerative colitis Daughter    Colon cancer Neg Hx    Esophageal cancer Neg Hx    Rectal cancer Neg Hx    Stomach cancer Neg Hx      Review of Systems:   Please see the history of present illness.    All other systems reviewed and are negative.     EKGs/Labs/Other Test Reviewed:    EKG: Sinus rhythm  Prior CV studies:  TTE 2019: - Left ventricle: The cavity size was normal. There was mild focal    basal hypertrophy of the septum.  Systolic function was normal.    The estimated ejection fraction was in the range of 55% to 60%.    Wall motion was normal; there were no regional wall motion    abnormalities. Doppler parameters are consistent with abnormal    left ventricular relaxation (grade 1 diastolic dysfunction).  - Aortic valve: There was no stenosis. There was mild    regurgitation.  - Aorta: Ascending aortic diameter: 38 mm (  S).  - Ascending aorta: The ascending aorta was mildly enlarged.  - Mitral valve: Mildly calcified annulus. There was trivial    regurgitation.  - Right ventricle: The cavity size was normal. Systolic function    was normal.  - Tricuspid valve: Peak RV-RA gradient (S): 20 mm Hg.  - Pulmonary arteries: PA peak pressure: 23 mm Hg (S).  - Inferior vena cava: The vessel was normal in size. The    respirophasic diameter changes were in the normal range (>= 50%),    consistent with normal central venous pressure.   Coronary angiography 02/10/2016: Normal LV systolic function, EF 57-97%. Severely diffusely calcified coronary vessels. Superdominant right coronary artery with very small LAD and circumflex coronary artery. Tandem proximal to mid and mid to distal high-grade 95% and 90% calcific stenosis in the superdominant RCA. Mid LAD 20-30% at most 40% calcific stenosis and circumflex mild disease.   Interventional data: Successful PTCA and rotational atherectomy with 1.5 mm burr followed by stenting of tandem lesion in the proximal to mid segment of the RCA and mid to distal segment of the right coronary artery with implantation of two 3.5 x 32 mm and a 3.5 x 16 mm Synergy DES for high-grade calcific 95% to 90% stenosis respectively.    Imaging studies that I have independently reviewed today: Cardiac catheterization and echocardiogram.  Recent Labs: 09/03/2021: TSH 2.79 12/01/2021: ALT 17; BUN 18; Creatinine 1.01; Hemoglobin 12.4; Platelet Count 133; Potassium 3.6; Sodium 138   Recent Lipid  Panel Lab Results  Component Value Date/Time   CHOL 104 09/03/2021 09:16 AM   CHOL 84 (L) 07/18/2018 09:01 AM   TRIG 104.0 09/03/2021 09:16 AM   HDL 39.50 09/03/2021 09:16 AM   HDL 43 07/18/2018 09:01 AM   LDLCALC 44 09/03/2021 09:16 AM   LDLCALC 45 07/10/2020 11:12 AM   LDLDIRECT 147.6 12/08/2007 08:21 AM    Risk Assessment/Calculations:          Physical Exam:    VS:  BP 126/60    Pulse 67    Ht 5' 10.5" (1.791 m)    Wt 199 lb 6.4 oz (90.4 kg)    SpO2 95%    BMI 28.21 kg/m    Wt Readings from Last 3 Encounters:  12/11/21 199 lb 6.4 oz (90.4 kg)  12/10/21 198 lb (89.8 kg)  12/01/21 197 lb 12.8 oz (89.7 kg)    GENERAL:  No apparent distress, AOx3 HEENT:  No carotid bruits, +2 carotid impulses, no scleral icterus CAR: RRR no murmurs, gallops, rubs, or thrills RES:  Clear to auscultation bilaterally ABD:  Soft, nontender, nondistended, positive bowel sounds x 4 VASC:  +2 radial pulses, +2 carotid pulses, palpable pedal pulses NEURO:  CN 2-12 grossly intact; motor and sensory grossly intact PSYCH:  No active depression or anxiety EXT:  No edema, ecchymosis, or cyanosis  Signed, Early Osmond, MD  12/11/2021 11:53 AM    [address], [place], [place]  [id number] Phone: [phone]; Fax: [phone]   Note:  This document was prepared using Dragon voice recognition software and may include unintentional dictation errors.

## 2021-12-11 NOTE — H&P (View-Only) (Signed)
Cardiology Office Note:    Date:  12/11/2021   ID:  Brian Mays, DOB 10/10/1946, MRN [id number]  PCP:  Darreld Mclean, MD   [facility] Providers Cardiologist:  Lenna Sciara, MD Referring MD: Darreld Mclean, MD   Chief Complaint/Reason for Referral:  ST depressions during endoscopy  ASSESSMENT:    Coronary artery disease involving native coronary artery of native heart with refractory angina pectoris ([place])    PLAN:    In order of problems listed above:  I suspect the patient has developed restenosis of his right coronary artery stents.  We will start Imdur 30 mg.  I will refer the patient for coronary angiography study given the fact he is on 2 antianginals without relief.  Further recommendations will follow the results of this testing.  I will have the patient follow-up in this office in 2 weeks.  Dispo:  No follow-ups on file.     Medication Adjustments/Labs and Tests Ordered: Current medicines are reviewed at length with the patient today.  Concerns regarding medicines are outlined above.   Tests Ordered: No orders of the defined types were placed in this encounter.   Medication Changes: No orders of the defined types were placed in this encounter.   History of Present Illness:    FOCUSED CARDIOVASCULAR PROBLEM LIST:   1.  Coronary artery disease status post rotational atherectomy and stenting of the RCA with mild LAD disease 2017 2.  Hypertension 3.  Hyperlipidemia 4.  Hypothyroidism 5.  IV dye allergy  The patient is a 77 y.o. male with the indicated medical history here for expedited visit given normal EGD and symptoms of dyspepsia as well as transient ST depressions during the procedure (I do not have records regarding the EKG changes).  I am seeing the patient as a doctor of the day as the patient is normally cared for by Dr. Acie Fredrickson.  The patient tells me that he has had ongoing belching which has been worse over the last summer.  He  initially thought this was related to his gastrointestinal system and he underwent an EGD yesterday which was negative.  Interestingly he tells me that when he exerts himself he will develop belching as well.  He is on as needed nitroglycerin and metoprolol as antianginals.  This does not seem to be controlling his pain.  Fortunately he has required no emergency room visits or hospitalizations.  Previous Medical History: Past Medical History:  Diagnosis Date   Anemia    in past   Cataract    Colon polyps    Diverticulosis    Gallstones    GERD (gastroesophageal reflux disease)    Heart attack ([place])    mild, Spring 2017   Heart murmur    Hemorrhoid    Hiatal hernia    HTN (hypertension)    Hyperlipidemia    Hypothyroidism    Iron deficiency anemia due to chronic blood loss 03/29/2019   Iron malabsorption 03/29/2019   Metastatic carcinoma ([place]) 2010   Dr Jonette Eva   Neuroendocrine cancer [facility]) 08/2021   Pancreatitis 1998   chronic     Current Medications: Current Meds  Medication Sig   aspirin EC 81 MG tablet Take 81 mg by mouth every morning.   atorvastatin (LIPITOR) 20 MG tablet TAKE 1 TABLET BY MOUTH DAILY AT 6 PM.   Cholecalciferol 1000 UNITS tablet Take 1,000 Units by mouth daily.   clopidogrel (PLAVIX) 75 MG tablet TAKE 1 TABLET BY MOUTH EVERY DAY  fluticasone (FLONASE) 50 MCG/ACT nasal spray Place 2 sprays into both nostrils daily as needed (sinuses).    hydrochlorothiazide (HYDRODIURIL) 25 MG tablet Take 0.5 tablets (12.5 mg total) by mouth daily.   hydrocortisone (ANUSOL-HC) 25 MG suppository INSERT 1 SUPPOSITORY RECTALLY TWICE A DAY AS NEEDED FOR HEMORRHOIDS   ipratropium (ATROVENT) 0.03 % nasal spray Place 2 sprays into the nose 3 (three) times daily.   Lanreotide Acetate (SOMATULINE DEPOT [place]) Inject 120 mcg into the skin every 28 (twenty-eight) days. Receives at Dr Antonieta Pert office   lansoprazole (PREVACID) 30 MG capsule Take 1 capsule (30 mg total) by mouth daily at  12 noon.   levothyroxine (SYNTHROID) 50 MCG tablet TAKE 1 TABLET BY MOUTH EVERY DAY BEFORE BREAKFAST   losartan (COZAAR) 100 MG tablet TAKE 1 TABLET BY MOUTH EVERY DAY   metoprolol tartrate (LOPRESSOR) 25 MG tablet Take 0.5 tablets (12.5 mg total) by mouth 2 (two) times daily.   nitroGLYCERIN (NITROSTAT) 0.4 MG SL tablet PLACE 1 TABLET UNDER THE TONGUE EVERY 5 (FIVE) MINUTES X 3 DOSES AS NEEDED FOR CHEST PAIN.   polyethylene glycol (MIRALAX / GLYCOLAX) 17 g packet Take 17 g by mouth daily.   promethazine (PHENERGAN) 25 MG tablet Take 1 tablet (25 mg total) by mouth every 6 (six) hours as needed.   sucralfate (CARAFATE) 1 g tablet Take 1 tablet (1 g total) by mouth 4 (four) times daily -  with meals and at bedtime.   vitamin B-12 (CYANOCOBALAMIN) 1000 MCG tablet Take 1,000 mcg by mouth every evening.      Allergies:    Iohexol   Social History:   Social History   Tobacco Use   Smoking status: Former    Packs/day: 1.50    Years: 35.00    Pack years: 52.50    Types: Cigarettes    Start date: 09/25/1959    Quit date: 12/21/1997    Years since quitting: 23.9   Smokeless tobacco: Never  Vaping Use   Vaping Use: Never used  Substance Use Topics   Alcohol use: Yes    Alcohol/week: 3.0 - 4.0 standard drinks    Types: 3 - 4 Cans of beer per week    Comment: 4 beers weekly    Drug use: No     Family Hx: Family History  Problem Relation Age of Onset   Kidney disease Mother    Hyperlipidemia Mother    Hypertension Mother    COPD Father    Ulcerative colitis Daughter    Colon cancer Neg Hx    Esophageal cancer Neg Hx    Rectal cancer Neg Hx    Stomach cancer Neg Hx      Review of Systems:   Please see the history of present illness.    All other systems reviewed and are negative.     EKGs/Labs/Other Test Reviewed:    EKG: Sinus rhythm  Prior CV studies:  TTE 2019: - Left ventricle: The cavity size was normal. There was mild focal    basal hypertrophy of the septum.  Systolic function was normal.    The estimated ejection fraction was in the range of 55% to 60%.    Wall motion was normal; there were no regional wall motion    abnormalities. Doppler parameters are consistent with abnormal    left ventricular relaxation (grade 1 diastolic dysfunction).  - Aortic valve: There was no stenosis. There was mild    regurgitation.  - Aorta: Ascending aortic diameter: 38 mm (  S).  - Ascending aorta: The ascending aorta was mildly enlarged.  - Mitral valve: Mildly calcified annulus. There was trivial    regurgitation.  - Right ventricle: The cavity size was normal. Systolic function    was normal.  - Tricuspid valve: Peak RV-RA gradient (S): 20 mm Hg.  - Pulmonary arteries: PA peak pressure: 23 mm Hg (S).  - Inferior vena cava: The vessel was normal in size. The    respirophasic diameter changes were in the normal range (>= 50%),    consistent with normal central venous pressure.   Coronary angiography 02/10/2016: Normal LV systolic function, EF 97-58%. Severely diffusely calcified coronary vessels. Superdominant right coronary artery with very small LAD and circumflex coronary artery. Tandem proximal to mid and mid to distal high-grade 95% and 90% calcific stenosis in the superdominant RCA. Mid LAD 20-30% at most 40% calcific stenosis and circumflex mild disease.   Interventional data: Successful PTCA and rotational atherectomy with 1.5 mm burr followed by stenting of tandem lesion in the proximal to mid segment of the RCA and mid to distal segment of the right coronary artery with implantation of two 3.5 x 32 mm and a 3.5 x 16 mm Synergy DES for high-grade calcific 95% to 90% stenosis respectively.    Imaging studies that I have independently reviewed today: Cardiac catheterization and echocardiogram.  Recent Labs: 09/03/2021: TSH 2.79 12/01/2021: ALT 17; BUN 18; Creatinine 1.01; Hemoglobin 12.4; Platelet Count 133; Potassium 3.6; Sodium 138   Recent Lipid  Panel Lab Results  Component Value Date/Time   CHOL 104 09/03/2021 09:16 AM   CHOL 84 (L) 07/18/2018 09:01 AM   TRIG 104.0 09/03/2021 09:16 AM   HDL 39.50 09/03/2021 09:16 AM   HDL 43 07/18/2018 09:01 AM   LDLCALC 44 09/03/2021 09:16 AM   LDLCALC 45 07/10/2020 11:12 AM   LDLDIRECT 147.6 12/08/2007 08:21 AM    Risk Assessment/Calculations:          Physical Exam:    VS:  BP 126/60    Pulse 67    Ht 5' 10.5" (1.791 m)    Wt 199 lb 6.4 oz (90.4 kg)    SpO2 95%    BMI 28.21 kg/m    Wt Readings from Last 3 Encounters:  12/11/21 199 lb 6.4 oz (90.4 kg)  12/10/21 198 lb (89.8 kg)  12/01/21 197 lb 12.8 oz (89.7 kg)    GENERAL:  No apparent distress, AOx3 HEENT:  No carotid bruits, +2 carotid impulses, no scleral icterus CAR: RRR no murmurs, gallops, rubs, or thrills RES:  Clear to auscultation bilaterally ABD:  Soft, nontender, nondistended, positive bowel sounds x 4 VASC:  +2 radial pulses, +2 carotid pulses, palpable pedal pulses NEURO:  CN 2-12 grossly intact; motor and sensory grossly intact PSYCH:  No active depression or anxiety EXT:  No edema, ecchymosis, or cyanosis  Signed, Early Osmond, MD  12/11/2021 11:53 AM    [address], [place],   [address] Phone: [phone]; Fax: [phone]   Note:  This document was prepared using Dragon voice recognition software and may include unintentional dictation errors.

## 2021-12-12 LAB — BASIC METABOLIC PANEL
BUN/Creatinine Ratio: 14 (ref 10–24)
BUN: 13 mg/dL (ref 8–27)
CO2: 23 mmol/L (ref 20–29)
Calcium: 9.1 mg/dL (ref 8.6–10.2)
Chloride: 100 mmol/L (ref 96–106)
Creatinine, Ser: 0.92 mg/dL (ref 0.76–1.27)
Glucose: 98 mg/dL (ref 70–99)
Potassium: 4.1 mmol/L (ref 3.5–5.2)
Sodium: 139 mmol/L (ref 134–144)
eGFR: 87 mL/min/{1.73_m2} (ref >59)

## 2021-12-12 LAB — CBC
Hematocrit: 37.2 % — ABNORMAL LOW (ref 37.5–51.0)
Hemoglobin: 12.7 g/dL — ABNORMAL LOW (ref 13.0–17.7)
MCH: 30.6 pg (ref 26.6–33.0)
MCHC: 34.1 g/dL (ref 31.5–35.7)
MCV: 90 fL (ref 79–97)
Platelets: 172 10*3/uL (ref 150–450)
RBC: 4.15 x10E6/uL (ref 4.14–5.80)
RDW: 13.2 % (ref 11.6–15.4)
WBC: 6.6 10*3/uL (ref 3.4–10.8)

## 2021-12-14 ENCOUNTER — Telehealth: Payer: Self-pay | Admitting: *Deleted

## 2021-12-14 ENCOUNTER — Telehealth: Payer: Self-pay

## 2021-12-14 NOTE — Telephone Encounter (Signed)
Attempted f/u phone call. No answer. Left message. °

## 2021-12-14 NOTE — Telephone Encounter (Signed)
°  Follow up Call-  Call back number 12/10/2021  Post procedure Call Back phone  # [phone]  Permission to leave phone message Yes  Some recent data might be hidden     Patient questions:  Do you have a fever, pain , or abdominal swelling? No. Pain Score  0 *  Have you tolerated food without any problems? Yes.    Have you been able to return to your normal activities? Yes.    Do you have any questions about your discharge instructions: Diet   No. Medications  No. Follow up visit  No.  Do you have questions or concerns about your Care? No.  Actions: * If pain score is 4 or above: No action needed, pain <4.

## 2021-12-15 ENCOUNTER — Telehealth: Payer: Self-pay | Admitting: *Deleted

## 2021-12-15 NOTE — Telephone Encounter (Signed)
Cardiac catheterization scheduled at [facility] for: Wednesday December 16, 2021 1 PM [facility] Main Entrance A [facility]) at: 11 AM   Diet-no solid food after midnight prior to cath, clear liquids until 5 AM day of procedure.  CONTRAST ALLERGY: yes 13 hour Prednisone and Benadryl Prep: 12/16/21  Prednisone 50 mg-12 Midnight 12/16/21 Prednisone 50 mg -6 AM 12/16/21 Prednisone 50 mg and Benadryl 50 mg -just prior to leaving home for hospital Pt advised not to drive   Medication instructions for procedure: -Hold:  HCTZ-AM of procedure  -Except hold medications usual morning medications can be taken pre-cath with sips of water including aspirin 81 mg and Plavix 75 mg.    Must have responsible adult to drive home post procedure and be with patient first 24 hours after arriving home.  [facility] does allow one visitor to wait in the waiting room during the time you are there.   Patient reports does not currently have any new symptoms concerning for COVID-19 and no household members with COVID-19 like illness.     Reviewed procedure instructions with patient.

## 2021-12-16 ENCOUNTER — Ambulatory Visit (HOSPITAL_COMMUNITY)
Admission: RE | Admit: 2021-12-16 | Discharge: 2021-12-17 | Disposition: A | Discharge status: Home / Self Care | Payer: Medicare HMO | Source: Ambulatory Visit | Attending: Internal Medicine | Admitting: Internal Medicine

## 2021-12-16 ENCOUNTER — Encounter: Payer: Self-pay | Admitting: Radiation Therapy

## 2021-12-16 ENCOUNTER — Ambulatory Visit (HOSPITAL_COMMUNITY)
Admission: RE | Disposition: A | Discharge status: Home / Self Care | Payer: Self-pay | Source: Ambulatory Visit | Attending: Internal Medicine

## 2021-12-16 ENCOUNTER — Other Ambulatory Visit: Payer: Self-pay

## 2021-12-16 DIAGNOSIS — E785 Hyperlipidemia, unspecified: Secondary | ICD-10-CM | POA: Insufficient documentation

## 2021-12-16 DIAGNOSIS — I200 Unstable angina: Secondary | ICD-10-CM | POA: Diagnosis present

## 2021-12-16 DIAGNOSIS — Y832 Surgical operation with anastomosis, bypass or graft as the cause of abnormal reaction of the patient, or of later complication, without mention of misadventure at the time of the procedure: Secondary | ICD-10-CM | POA: Insufficient documentation

## 2021-12-16 DIAGNOSIS — I10 Essential (primary) hypertension: Secondary | ICD-10-CM | POA: Insufficient documentation

## 2021-12-16 DIAGNOSIS — T82855A Stenosis of coronary artery stent, initial encounter: Secondary | ICD-10-CM | POA: Diagnosis not present

## 2021-12-16 DIAGNOSIS — I25110 Atherosclerotic heart disease of native coronary artery with unstable angina pectoris: Secondary | ICD-10-CM | POA: Diagnosis not present

## 2021-12-16 DIAGNOSIS — Z955 Presence of coronary angioplasty implant and graft: Secondary | ICD-10-CM | POA: Insufficient documentation

## 2021-12-16 DIAGNOSIS — Z87891 Personal history of nicotine dependence: Secondary | ICD-10-CM | POA: Insufficient documentation

## 2021-12-16 DIAGNOSIS — E039 Hypothyroidism, unspecified: Secondary | ICD-10-CM | POA: Insufficient documentation

## 2021-12-16 DIAGNOSIS — I25112 Atherosclerotic heart disease of native coronary artery with refractory angina pectoris: Secondary | ICD-10-CM | POA: Insufficient documentation

## 2021-12-16 DIAGNOSIS — Z91041 Radiographic dye allergy status: Secondary | ICD-10-CM | POA: Diagnosis not present

## 2021-12-16 DIAGNOSIS — I252 Old myocardial infarction: Secondary | ICD-10-CM | POA: Diagnosis not present

## 2021-12-16 HISTORY — PX: LEFT HEART CATH AND CORONARY ANGIOGRAPHY: CATH118249

## 2021-12-16 HISTORY — PX: CORONARY STENT INTERVENTION: CATH118234

## 2021-12-16 LAB — POCT ACTIVATED CLOTTING TIME
Activated Clotting Time: 239 seconds
Activated Clotting Time: 305 seconds
Activated Clotting Time: 299 seconds

## 2021-12-16 SURGERY — LEFT HEART CATH AND CORONARY ANGIOGRAPHY
Anesthesia: LOCAL

## 2021-12-16 MED ORDER — FENTANYL CITRATE (PF) 100 MCG/2ML IJ SOLN
INTRAMUSCULAR | Status: DC | PRN
  Administered 2021-12-16 (×5): 25 ug via INTRAVENOUS

## 2021-12-16 MED ORDER — HEPARIN SODIUM (PORCINE) 1000 UNIT/ML IJ SOLN
INTRAMUSCULAR | Status: AC
  Filled 2021-12-16: qty 10

## 2021-12-16 MED ORDER — SODIUM CHLORIDE 0.9% FLUSH: 3.0000 mL | INTRAVENOUS | Status: DC | PRN

## 2021-12-16 MED ORDER — FENTANYL CITRATE (PF) 100 MCG/2ML IJ SOLN
INTRAMUSCULAR | Status: AC
  Filled 2021-12-16: qty 2

## 2021-12-16 MED ORDER — HYDRALAZINE HCL 20 MG/ML IJ SOLN: 10.0000 mg | INTRAMUSCULAR | Status: AC | PRN

## 2021-12-16 MED ORDER — SODIUM CHLORIDE 0.9 % IV SOLN: INTRAVENOUS | Status: AC

## 2021-12-16 MED ORDER — SODIUM CHLORIDE 0.9% FLUSH
3.0000 mL | Freq: Two times a day (BID) | INTRAVENOUS | Status: DC
  Administered 2021-12-16 – 2021-12-17 (×2): 3 mL via INTRAVENOUS

## 2021-12-16 MED ORDER — ATORVASTATIN CALCIUM 10 MG PO TABS
20.0000 mg | ORAL_TABLET | Freq: Every day | ORAL | Status: DC
Start: 2021-12-16 — End: 2021-12-17
  Administered 2021-12-16 – 2021-12-17 (×2): 20 mg via ORAL
  Filled 2021-12-16 (×2): qty 2

## 2021-12-16 MED ORDER — ASPIRIN EC 81 MG PO TBEC
81.0000 mg | DELAYED_RELEASE_TABLET | Freq: Every morning | ORAL | Status: DC
  Administered 2021-12-17: 81 mg via ORAL
  Filled 2021-12-16: qty 1

## 2021-12-16 MED ORDER — MIDAZOLAM HCL 2 MG/2ML IJ SOLN
INTRAMUSCULAR | Status: AC
  Filled 2021-12-16: qty 2

## 2021-12-16 MED ORDER — LEVOTHYROXINE SODIUM 50 MCG PO TABS
50.0000 ug | ORAL_TABLET | Freq: Every day | ORAL | Status: DC
  Administered 2021-12-17: 50 ug via ORAL
  Filled 2021-12-16: qty 1

## 2021-12-16 MED ORDER — MIDAZOLAM HCL 2 MG/2ML IJ SOLN
INTRAMUSCULAR | Status: DC | PRN
  Administered 2021-12-16 (×5): 1 mg via INTRAVENOUS

## 2021-12-16 MED ORDER — SODIUM CHLORIDE 0.9 % IV SOLN: 250.0000 mL | INTRAVENOUS | Status: DC | PRN

## 2021-12-16 MED ORDER — NITROGLYCERIN 0.4 MG SL SUBL
SUBLINGUAL_TABLET | SUBLINGUAL | Status: DC | PRN
  Administered 2021-12-16: .4 mg via SUBLINGUAL

## 2021-12-16 MED ORDER — ACETAMINOPHEN 325 MG PO TABS: 650.0000 mg | ORAL_TABLET | ORAL | Status: DC | PRN

## 2021-12-16 MED ORDER — NITROGLYCERIN 0.4 MG SL SUBL
SUBLINGUAL_TABLET | SUBLINGUAL | Status: AC
  Filled 2021-12-16: qty 1

## 2021-12-16 MED ORDER — SODIUM CHLORIDE 0.9 % WEIGHT BASED INFUSION
3.0000 mL/kg/h | INTRAVENOUS | Status: DC
  Administered 2021-12-16: 3 mL/kg/h via INTRAVENOUS

## 2021-12-16 MED ORDER — LIDOCAINE HCL (PF) 1 % IJ SOLN
INTRAMUSCULAR | Status: AC
  Filled 2021-12-16: qty 30

## 2021-12-16 MED ORDER — HEPARIN (PORCINE) IN NACL 1000-0.9 UT/500ML-% IV SOLN
INTRAVENOUS | Status: AC
  Filled 2021-12-16: qty 1000

## 2021-12-16 MED ORDER — LOSARTAN POTASSIUM 50 MG PO TABS
100.0000 mg | ORAL_TABLET | Freq: Every day | ORAL | Status: DC
  Administered 2021-12-17: 100 mg via ORAL
  Filled 2021-12-16: qty 2

## 2021-12-16 MED ORDER — ONDANSETRON HCL 4 MG/2ML IJ SOLN: 4.0000 mg | Freq: Four times a day (QID) | INTRAMUSCULAR | Status: DC | PRN

## 2021-12-16 MED ORDER — HYDROCHLOROTHIAZIDE 12.5 MG PO TABS
12.5000 mg | ORAL_TABLET | Freq: Every day | ORAL | Status: DC
  Administered 2021-12-17: 12.5 mg via ORAL
  Filled 2021-12-16: qty 1

## 2021-12-16 MED ORDER — HEPARIN (PORCINE) IN NACL 1000-0.9 UT/500ML-% IV SOLN
INTRAVENOUS | Status: DC | PRN
  Administered 2021-12-16 (×2): 500 mL

## 2021-12-16 MED ORDER — SODIUM CHLORIDE 0.9% FLUSH
3.0000 mL | Freq: Two times a day (BID) | INTRAVENOUS | Status: DC
  Administered 2021-12-17: 3 mL via INTRAVENOUS

## 2021-12-16 MED ORDER — NITROGLYCERIN 0.4 MG SL SUBL: 0.4000 mg | SUBLINGUAL_TABLET | SUBLINGUAL | Status: DC | PRN

## 2021-12-16 MED ORDER — METOPROLOL TARTRATE 12.5 MG HALF TABLET
12.5000 mg | ORAL_TABLET | Freq: Two times a day (BID) | ORAL | Status: DC
  Administered 2021-12-16 – 2021-12-17 (×2): 12.5 mg via ORAL
  Filled 2021-12-16 (×2): qty 1

## 2021-12-16 MED ORDER — LIDOCAINE HCL (PF) 1 % IJ SOLN
INTRAMUSCULAR | Status: DC | PRN
  Administered 2021-12-16: 2 mL

## 2021-12-16 MED ORDER — CLOPIDOGREL BISULFATE 75 MG PO TABS
75.0000 mg | ORAL_TABLET | Freq: Every day | ORAL | Status: DC
  Administered 2021-12-17: 75 mg via ORAL
  Filled 2021-12-16: qty 1

## 2021-12-16 MED ORDER — NITROGLYCERIN IN D5W 200-5 MCG/ML-% IV SOLN
INTRAVENOUS | Status: AC
  Filled 2021-12-16: qty 250

## 2021-12-16 MED ORDER — VERAPAMIL HCL 2.5 MG/ML IV SOLN
INTRAVENOUS | Status: AC
  Filled 2021-12-16: qty 2

## 2021-12-16 MED ORDER — NOREPINEPHRINE 4 MG/250ML-% IV SOLN
INTRAVENOUS | Status: AC
  Filled 2021-12-16: qty 250

## 2021-12-16 MED ORDER — VERAPAMIL HCL 2.5 MG/ML IV SOLN
INTRAVENOUS | Status: DC | PRN
  Administered 2021-12-16: 10 mL via INTRA_ARTERIAL

## 2021-12-16 MED ORDER — LABETALOL HCL 5 MG/ML IV SOLN: 10.0000 mg | INTRAVENOUS | Status: AC | PRN

## 2021-12-16 MED ORDER — ASPIRIN 81 MG PO CHEW: 81.0000 mg | CHEWABLE_TABLET | ORAL | Status: DC

## 2021-12-16 MED ORDER — HEPARIN SODIUM (PORCINE) 1000 UNIT/ML IJ SOLN
INTRAMUSCULAR | Status: DC | PRN
  Administered 2021-12-16 (×2): 5000 [IU] via INTRAVENOUS
  Administered 2021-12-16: 4000 [IU] via INTRAVENOUS

## 2021-12-16 MED ORDER — SODIUM CHLORIDE 0.9 % WEIGHT BASED INFUSION: 1.0000 mL/kg/h | INTRAVENOUS | Status: DC

## 2021-12-16 MED ORDER — CLOPIDOGREL BISULFATE 75 MG PO TABS: 75.0000 mg | ORAL_TABLET | Freq: Every day | ORAL | Status: DC

## 2021-12-16 SURGICAL SUPPLY — 28 items
BALLN  ~~LOC~~ SAPPHIRE 4.5X12 (BALLOONS) ×2
BALLN SAPPHIRE 2.5X12 (BALLOONS) ×4
BALLN ~~LOC~~ EMERGE MR 3.0X12 (BALLOONS) ×2
BALLN ~~LOC~~ EUPHORA RX 3.5X12 (BALLOONS) ×2
BALLN ~~LOC~~ SAPPHIRE 4.5X12 (BALLOONS) ×1
BALLOON SAPPHIRE 2.5X12 (BALLOONS) IMPLANT
BALLOON ~~LOC~~ EMERGE MR 3.0X12 (BALLOONS) IMPLANT
BALLOON ~~LOC~~ EUPHORA RX 3.5X12 (BALLOONS) IMPLANT
BALLOON ~~LOC~~ SAPPHIRE 4.5X12 (BALLOONS) IMPLANT
CATH GUIDEZILLA II 6F (CATHETERS) IMPLANT
CATH INFINITI 6F ANG MULTIPACK (CATHETERS) ×1 IMPLANT
CATH INFINITI 6F FL3.5 (CATHETERS) ×1 IMPLANT
CATH VISTA GUIDE 6FR AL1 (CATHETERS) ×1 IMPLANT
CATH VISTA GUIDE 6FR JR4 (CATHETERS) ×1 IMPLANT
CATHETER GUIDEZILLA II 6F (CATHETERS) ×2
DEVICE RAD COMP TR BAND LRG (VASCULAR PRODUCTS) ×1 IMPLANT
GLIDESHEATH SLEND SS 6F .021 (SHEATH) ×1 IMPLANT
GUIDEWIRE INQWIRE 1.5J.035X260 (WIRE) IMPLANT
GUIDEWIRE VAS SION BLUE 190 (WIRE) ×1 IMPLANT
INQWIRE 1.5J .035X260CM (WIRE) ×2
KIT ENCORE 26 ADVANTAGE (KITS) ×1 IMPLANT
KIT HEART LEFT (KITS) ×2 IMPLANT
PACK CARDIAC CATHETERIZATION (CUSTOM PROCEDURE TRAY) ×2 IMPLANT
STENT ONYX FRONTIER 4.0X12 (Permanent Stent) ×1 IMPLANT
STENT ONYX FRONTIER 4.0X18 (Permanent Stent) ×1 IMPLANT
TRANSDUCER W/STOPCOCK (MISCELLANEOUS) ×2 IMPLANT
TUBING CIL FLEX 10 FLL-RA (TUBING) ×2 IMPLANT
WIRE ASAHI PROWATER 180CM (WIRE) ×2 IMPLANT

## 2021-12-16 NOTE — Progress Notes (Signed)
Dr. Ali Lowe in to speak w/patient; aware of 3/10 chest pressure. Eating [place] sandwich.

## 2021-12-16 NOTE — Progress Notes (Signed)
Written reminder card including my contact information was mailed out to pt. regarding his April Thoracic Spine MRI and telephone follow-up with Shona Simpson PA-C, to review the results.   Mont Dutton R.T.(R)(T) Radiation Special Procedures Navigator

## 2021-12-16 NOTE — Interval H&P Note (Signed)
History and Physical Interval Note:  12/16/2021 2:12 PM  Brian Mays  has presented today for surgery, with the diagnosis of CAD.  The various methods of treatment have been discussed with the patient and family. After consideration of risks, benefits and other options for treatment, the patient has consented to  Procedure(s): LEFT HEART CATH AND CORONARY ANGIOGRAPHY (N/A) as a surgical intervention.  The patient's history has been reviewed, patient examined, no change in status, stable for surgery.  I have reviewed the patient's chart and labs.  Questions were answered to the patient's satisfaction.    Cath Lab Visit (complete for each Cath Lab visit)  Clinical Evaluation Leading to the Procedure:   ACS: No.  Non-ACS:    Anginal Classification: CCS II  Anti-ischemic medical therapy: Maximal Therapy (2 or more classes of medications)  Non-Invasive Test Results: No non-invasive testing performed  Prior CABG: No previous CABG        Early Osmond

## 2021-12-17 ENCOUNTER — Encounter: Payer: Self-pay | Admitting: Internal Medicine

## 2021-12-17 ENCOUNTER — Encounter (HOSPITAL_COMMUNITY): Payer: Self-pay | Admitting: Internal Medicine

## 2021-12-17 DIAGNOSIS — I200 Unstable angina: Secondary | ICD-10-CM | POA: Diagnosis not present

## 2021-12-17 DIAGNOSIS — I10 Essential (primary) hypertension: Secondary | ICD-10-CM | POA: Diagnosis not present

## 2021-12-17 DIAGNOSIS — Z87891 Personal history of nicotine dependence: Secondary | ICD-10-CM | POA: Diagnosis not present

## 2021-12-17 DIAGNOSIS — T82855A Stenosis of coronary artery stent, initial encounter: Secondary | ICD-10-CM | POA: Diagnosis not present

## 2021-12-17 DIAGNOSIS — E785 Hyperlipidemia, unspecified: Secondary | ICD-10-CM | POA: Diagnosis not present

## 2021-12-17 DIAGNOSIS — Y832 Surgical operation with anastomosis, bypass or graft as the cause of abnormal reaction of the patient, or of later complication, without mention of misadventure at the time of the procedure: Secondary | ICD-10-CM | POA: Diagnosis not present

## 2021-12-17 DIAGNOSIS — Z91041 Radiographic dye allergy status: Secondary | ICD-10-CM | POA: Diagnosis not present

## 2021-12-17 DIAGNOSIS — Z955 Presence of coronary angioplasty implant and graft: Secondary | ICD-10-CM | POA: Diagnosis not present

## 2021-12-17 DIAGNOSIS — I252 Old myocardial infarction: Secondary | ICD-10-CM | POA: Diagnosis not present

## 2021-12-17 DIAGNOSIS — E039 Hypothyroidism, unspecified: Secondary | ICD-10-CM | POA: Diagnosis not present

## 2021-12-17 DIAGNOSIS — I25112 Atherosclerotic heart disease of native coronary artery with refractory angina pectoris: Secondary | ICD-10-CM | POA: Diagnosis not present

## 2021-12-17 LAB — BASIC METABOLIC PANEL
Anion gap: 8 (ref 5–15)
BUN: 14 mg/dL (ref 8–23)
CO2: 25 mmol/L (ref 22–32)
Calcium: 9.1 mg/dL (ref 8.9–10.3)
Chloride: 104 mmol/L (ref 98–111)
Creatinine, Ser: 0.98 mg/dL (ref 0.61–1.24)
GFR, Estimated: >60 mL/min (ref >60)
Glucose, Bld: 153 mg/dL — ABNORMAL HIGH (ref 70–99)
Potassium: 3.6 mmol/L (ref 3.5–5.1)
Sodium: 137 mmol/L (ref 135–145)

## 2021-12-17 LAB — CBC
HCT: 36.9 % — ABNORMAL LOW (ref 39.0–52.0)
Hemoglobin: 12.1 g/dL — ABNORMAL LOW (ref 13.0–17.0)
MCH: 30.3 pg (ref 26.0–34.0)
MCHC: 32.8 g/dL (ref 30.0–36.0)
MCV: 92.3 fL (ref 80.0–100.0)
Platelets: 150 10*3/uL (ref 150–400)
RBC: 4 MIL/uL — ABNORMAL LOW (ref 4.22–5.81)
RDW: 14.2 % (ref 11.5–15.5)
WBC: 11.1 10*3/uL — ABNORMAL HIGH (ref 4.0–10.5)
nRBC: 0 % (ref 0.0–0.2)

## 2021-12-17 MED ORDER — METOPROLOL TARTRATE 25 MG PO TABS: 25.0000 mg | ORAL_TABLET | Freq: Two times a day (BID) | ORAL | 3 refills | Status: DC

## 2021-12-17 MED FILL — Nitroglycerin IV Soln 200 MCG/ML in D5W: INTRAVENOUS | Qty: 250 | Status: AC

## 2021-12-17 MED FILL — Norepinephrine-NaCl IV Solution 4 MG/250ML-0.9%: INTRAVENOUS | Qty: 250 | Status: AC

## 2021-12-17 NOTE — Progress Notes (Signed)
CARDIAC REHAB PHASE I   PRE:  Rate/Rhythm: 74 SR    BP: sitting 168/75    SaO2: 95 RA  MODE:  Ambulation: 340 ft   POST:  Rate/Rhythm: 90 SR    BP: sitting 165/78     SaO2: 97 RA  Tolerated well. Did c/o lightheadedness during walk and wonders if its from imdur. Discussed stents, Plavix, restrictions, diet, exercise, NTG and CRPII. Will refer to [place]. Pt receptive. [id number]   [place], ACSM 12/17/2021 10:57 AM

## 2021-12-17 NOTE — Discharge Summary (Signed)
Discharge Summary    Patient ID: Brian Mays MRN: [id number]; DOB: 23-Mar-1946  Admit date: 12/16/2021 Discharge date: 12/17/2021  PCP:  Darreld Mclean, MD   [place] [facility] Cardiologist:  Mertie Moores, MD        Discharge Diagnoses    Principal Problem:   Unstable angina [facility])    Diagnostic Studies/Procedures    CARDIAC CATH: 12/16/2021   Prox RCA lesion is 25% stenosed.   Mid RCA lesion is 99% stenosed.   A stent was successfully placed.   A stent was successfully placed.   Post intervention, there is a 0% residual stenosis.   Post intervention, there is a 0% residual stenosis.   1.  High-grade in-stent restenosis with of mid right coronary artery stents with TIMI II flow beyond; this was treated with 1 drug-eluting stent.  A stent in the proximal right coronary artery was also placed due to guide trauma.   Recommendations: Dual antiplatelet therapy for at least 6 months and aggressive cardiovascular risk factor management.  Diagnostic Dominance: Right Intervention _____________   History of Present Illness     Brian Mays is a 76 y.o. male with Hx CAD was admitted 02/15 with symptoms worrisome for in-stent restenosis of his RCA.  He underwent successful catheterization and PCI to RCA.  Hospital Course     Consultants: None  CAD: Status post catheterization yesterday which showed high-grade mid RCA stenosis which was successfully stented. Due to catheter induced trauma, an additional stent was placed proximally.  EKG today is stable.  The patient denies any recurrent symptomatology.  We will increase metoprolol to 25 mg twice a day. On DAPT with aspirin/Plavix  Hypertension: Patient has been on losartan 100 mg, and metoprolol.  We will increase to 25 mg twice a day.  Hyperlipidemia with target less than 70: LDL cholesterol 44 on atorvastatin 20 mg daily, continue this  Hypothyroidism: Most recent TSH 09/2021 was normal.  Continue levothyroxine  50 mcg.    Did the patient have an acute coronary syndrome (MI, NSTEMI, STEMI, etc) this admission?:  No                               Did the patient have a percutaneous coronary intervention (stent / angioplasty)?:  Yes.     Cath/PCI Registry Performance & Quality Measures: Aspirin prescribed? - Yes ADP Receptor Inhibitor (Plavix/Clopidogrel, Brilinta/Ticagrelor or Effient/Prasugrel) prescribed (includes medically managed patients)? - Yes High Intensity Statin (Lipitor 40-80mg  or Crestor 20-40mg ) prescribed? - No -LDL is at goal on Lipitor 20 mg daily For EF <40%, was ACEI/ARB prescribed? - Not Applicable (EF >/= 62%) For EF <40%, Aldosterone Antagonist (Spironolactone or Eplerenone) prescribed? - Not Applicable (EF >/= 13%) Cardiac Rehab Phase II ordered? - Yes     Discharge Vitals Blood pressure (!) 157/96, pulse 72, temperature 98.7 F (37.1 C), temperature source Oral, resp. rate (!) 21, height 5' 10.5" (1.791 m), weight 88 kg, SpO2 96 %.  Filed Weights   12/16/21 1117 12/16/21 2003  Weight: 90.3 kg 88 kg    Labs & Radiologic Studies    CBC Recent Labs    12/17/21 0124  WBC 11.1*  HGB 12.1*  HCT 36.9*  MCV 92.3  PLT 086   Basic Metabolic Panel Recent Labs    12/17/21 0124  NA 137  K 3.6  CL 104  CO2 25  GLUCOSE 153*  BUN 14  CREATININE 0.98  CALCIUM 9.1   Liver Function Tests No results for input(s): AST, ALT, ALKPHOS, BILITOT, PROT, ALBUMIN in the last 72 hours. No results for input(s): LIPASE, AMYLASE in the last 72 hours. High Sensitivity Troponin:   No results for input(s): TROPONINIHS in the last 720 hours.  BNP Invalid input(s): POCBNP D-Dimer No results for input(s): DDIMER in the last 72 hours. Hemoglobin A1C No results for input(s): HGBA1C in the last 72 hours. Fasting Lipid Panel  Thyroid Function Tests No results for input(s): TSH, T4TOTAL, T3FREE, THYROIDAB in the last 72 hours.  Invalid input(s): FREET3   _____________  CARDIAC  CATHETERIZATION  Addendum Date: 12/17/2021     Prox RCA lesion is 25% stenosed.   Mid RCA lesion is 99% stenosed.   A stent was successfully placed.   A stent was successfully placed.   Post intervention, there is a 0% residual stenosis.   Post intervention, there is a 0% residual stenosis. 1.  High-grade in-stent restenosis with of mid right coronary artery stents with TIMI II flow beyond; this was treated with 1 drug-eluting stent.  A stent in the proximal right coronary artery was also placed due to guide trauma. Recommendations: Dual antiplatelet therapy for at least 6 months and aggressive cardiovascular risk factor management.  Result Date: 12/17/2021   Prox RCA lesion is 25% stenosed.   Mid RCA lesion is 20% stenosed.   A stent was successfully placed.   A stent was successfully placed.   Post intervention, there is a 0% residual stenosis.   Post intervention, there is a 0% residual stenosis. 1.  High-grade in-stent restenosis with of mid right coronary artery stents with TIMI II flow beyond; this was treated with 1 drug-eluting stent.  A stent in the proximal right coronary artery was also placed due to guide trauma. Recommendations: Dual antiplatelet therapy for at least 6 months and aggressive cardiovascular risk factor management.   Disposition   Pt is being discharged home today in good condition.  Follow-up Plans & Appointments     Discharge Instructions     Amb Referral to Cardiac Rehabilitation   Complete by: As directed    Diagnosis:  Coronary Stents PTCA     After initial evaluation and assessments completed: Virtual Based Care may be provided alone or in conjunction with Phase 2 Cardiac Rehab based on patient barriers.: Yes   Diet - low sodium heart healthy   Complete by: As directed    Increase activity slowly   Complete by: As directed        Discharge Medications   Allergies as of 12/17/2021       Reactions   Iohexol Hives    Code: HIVES, Desc: PER [name] @  PRIMARY CARE, PT IS ALLERGIC TO CONTRAST DYE 10/02/08/RM  05/01/10...needs full premeds per our protocol w/ [facility]., Onset Date: [id number]        Medication List     STOP taking these medications    promethazine 25 MG tablet Commonly known as: PHENERGAN   sucralfate 1 g tablet Commonly known as: Carafate       TAKE these medications    aspirin EC 81 MG tablet Take 81 mg by mouth every morning.   atorvastatin 20 MG tablet Commonly known as: LIPITOR TAKE 1 TABLET BY MOUTH DAILY AT 6 PM.   Cholecalciferol 25 MCG (1000 UT) tablet Take 1,000 Units by mouth 2 (two) times daily.   clopidogrel 75 MG tablet Commonly known as: PLAVIX TAKE 1 TABLET  BY MOUTH EVERY DAY   diphenhydramine-acetaminophen 25-500 MG Tabs tablet Commonly known as: TYLENOL PM Take 1 tablet by mouth at bedtime as needed (sleep/pain).   fluticasone 50 MCG/ACT nasal spray Commonly known as: FLONASE Place 2 sprays into both nostrils daily as needed (sinuses).   hydrochlorothiazide 25 MG tablet Commonly known as: HYDRODIURIL Take 0.5 tablets (12.5 mg total) by mouth daily.   hydrocortisone 25 MG suppository Commonly known as: ANUSOL-HC INSERT 1 SUPPOSITORY RECTALLY TWICE A DAY AS NEEDED FOR HEMORRHOIDS   ipratropium 0.03 % nasal spray Commonly known as: Atrovent Place 2 sprays into the nose 3 (three) times daily. What changed:  when to take this reasons to take this   isosorbide mononitrate 30 MG 24 hr tablet Commonly known as: IMDUR Take 1 tablet (30 mg total) by mouth daily.   lansoprazole 30 MG capsule Commonly known as: PREVACID Take 1 capsule (30 mg total) by mouth daily at 12 noon.   levothyroxine 50 MCG tablet Commonly known as: SYNTHROID TAKE 1 TABLET BY MOUTH EVERY DAY BEFORE BREAKFAST   losartan 100 MG tablet Commonly known as: COZAAR TAKE 1 TABLET BY MOUTH EVERY DAY   metoprolol tartrate 25 MG tablet Commonly known as: LOPRESSOR Take 1 tablet (25 mg total) by mouth 2  (two) times daily. What changed: how much to take   nitroGLYCERIN 0.4 MG SL tablet Commonly known as: NITROSTAT PLACE 1 TABLET UNDER THE TONGUE EVERY 5 (FIVE) MINUTES X 3 DOSES AS NEEDED FOR CHEST PAIN.   polyethylene glycol 17 g packet Commonly known as: MIRALAX / GLYCOLAX Take 17 g by mouth daily.   predniSONE 50 MG tablet Commonly known as: DELTASONE Take one tablet by mouth 13 hours, 7 hours prior to procedure and before leaving home prior to procedure.   SOMATULINE DEPOT [place] Inject 120 mcg into the skin every 28 (twenty-eight) days. Receives at Dr Antonieta Pert office   SOOTHE XP OP Place 1 drop into both eyes daily as needed (dry eyes).   vitamin B-12 1000 MCG tablet Commonly known as: CYANOCOBALAMIN Take 1,000 mcg by mouth every other day.           Outstanding Labs/Studies   None  Duration of Discharge Encounter   Greater than 30 minutes including physician time.  Signed, Rosaria Ferries, PA-C 12/17/2021, 11:20 AM

## 2021-12-17 NOTE — Plan of Care (Signed)
  Problem: Education: Goal: Understanding of CV disease, CV risk reduction, and recovery process will improve Outcome: Progressing   Problem: Activity: Goal: Ability to return to baseline activity level will improve Outcome: Progressing   Problem: Cardiovascular: Goal: Ability to achieve and maintain adequate cardiovascular perfusion will improve Outcome: Progressing Goal: Vascular access site(s) Level 0-1 will be maintained Outcome: Progressing   

## 2021-12-17 NOTE — Discharge Instructions (Signed)

## 2021-12-17 NOTE — Progress Notes (Signed)
Progress Note  Patient Name: Brian Mays Date of Encounter: 12/17/2021  Primary Cardiologist: Dr. Acie Fredrickson  Subjective   Feels well, no recurrent chest pain or burping.  Inpatient Medications    Scheduled Meds:  aspirin EC  81 mg Oral q morning   atorvastatin  20 mg Oral Daily   clopidogrel  75 mg Oral Q breakfast   hydrochlorothiazide  12.5 mg Oral Daily   levothyroxine  50 mcg Oral QAC breakfast   losartan  100 mg Oral Daily   metoprolol tartrate  12.5 mg Oral BID   sodium chloride flush  3 mL Intravenous Q12H   sodium chloride flush  3 mL Intravenous Q12H   Continuous Infusions:  sodium chloride     PRN Meds: sodium chloride, acetaminophen, nitroGLYCERIN, ondansetron (ZOFRAN) IV, sodium chloride flush   Vital Signs    Vitals:   12/17/21 0100 12/17/21 0200 12/17/21 0324 12/17/21 0725  BP: (!) 125/59 133/68 120/65 (!) 157/96  Pulse: 67 81 63 72  Resp: 15 18 13  (!) 21  Temp:   97.7 F (36.5 C) 98.7 F (37.1 C)  TempSrc:   Oral Oral  SpO2: 97% 97% 95% 96%  Weight:      Height:        Intake/Output Summary (Last 24 hours) at 12/17/2021 0927 Last data filed at 12/17/2021 0700 Gross per 24 hour  Intake 360 ml  Output 600 ml  Net -240 ml    I/O since admission: -240  Filed Weights   12/16/21 1117 12/16/21 2003  Weight: 90.3 kg 88 kg    Telemetry    Sinus - Personally Reviewed  ECG    ECG (independently read by me): Normal sinus rhythm at 66 bpm, first-degree AV block, T wave inversion leads III and aVF.  Physical Exam    BP (!) 157/96 (BP Location: Left Arm)    Pulse 72    Temp 98.7 F (37.1 C) (Oral)    Resp (!) 21    Ht 5' 10.5" (1.791 m)    Wt 88 kg    SpO2 96%    BMI 27.44 kg/m  General: Alert, oriented, no distress.  Skin: normal turgor, no rashes, warm and dry HEENT: Normocephalic, atraumatic. Pupils equal round and reactive to light; sclera anicteric; extraocular muscles intact;  Nose without nasal septal hypertrophy Mouth/Parynx  benign; Mallinpatti scale 3 Neck: No JVD, no carotid bruits; normal carotid upstroke Lungs: clear to ausculatation and percussion; no wheezing or rales Chest wall: without tenderness to palpitation Heart: PMI not displaced, RRR, s1 s2 normal, 1/6 systolic murmur, no diastolic murmur, no rubs, gallops, thrills, or heaves Abdomen: soft, nontender; no hepatosplenomehaly, BS+; abdominal aorta nontender and not dilated by palpation. Back: no CVA tenderness Pulses 2+ right radial site stable without hematoma or ecchymoses Musculoskeletal: full range of motion, normal strength, no joint deformities Extremities: no clubbing cyanosis or edema, Homan's sign negative  Neurologic: grossly nonfocal; Cranial nerves grossly wnl Psychologic: Normal mood and affect   Labs    Chemistry Recent Labs  Lab 12/11/21 1221 12/17/21 0124  NA 139 137  K 4.1 3.6  CL 100 104  CO2 23 25  GLUCOSE 98 153*  BUN 13 14  CREATININE 0.92 0.98  CALCIUM 9.1 9.1  GFRNONAA  --  >60  ANIONGAP  --  8     Hematology Recent Labs  Lab 12/11/21 1221 12/17/21 0124  WBC 6.6 11.1*  RBC 4.15 4.00*  HGB 12.7* 12.1*  HCT 37.2*  36.9*  MCV 90 92.3  MCH 30.6 30.3  MCHC 34.1 32.8  RDW 13.2 14.2  PLT 172 150    Cardiac EnzymesNo results for input(s): TROPONINI in the last 168 hours. No results for input(s): TROPIPOC in the last 168 hours.   BNPNo results for input(s): BNP, PROBNP in the last 168 hours.   DDimer No results for input(s): DDIMER in the last 168 hours.   Lipid Panel     Component Value Date/Time   CHOL 104 09/03/2021 0916   CHOL 84 (L) 07/18/2018 0901   TRIG 104.0 09/03/2021 0916   HDL 39.50 09/03/2021 0916   HDL 43 07/18/2018 0901   CHOLHDL 3 09/03/2021 0916   VLDL 20.8 09/03/2021 0916   LDLCALC 44 09/03/2021 0916   LDLCALC 45 07/10/2020 1112   LDLDIRECT 147.6 12/08/2007 0821     Radiology    CARDIAC CATHETERIZATION  Result Date: 12/16/2021   Prox RCA lesion is 25% stenosed.   Mid RCA  lesion is 20% stenosed.   A stent was successfully placed.   A stent was successfully placed.   Post intervention, there is a 0% residual stenosis.   Post intervention, there is a 0% residual stenosis. 1.  High-grade in-stent restenosis with of mid right coronary artery stents with TIMI II flow beyond; this was treated with 1 drug-eluting stent.  A stent in the proximal right coronary artery was also placed due to guide trauma. Recommendations: Dual antiplatelet therapy for at least 6 months and aggressive cardiovascular risk factor management.    Cardiac Studies     Prox RCA lesion is 25% stenosed.   Mid RCA lesion is 20% stenosed.   A stent was successfully placed.   A stent was successfully placed.   Post intervention, there is a 0% residual stenosis.   Post intervention, there is a 0% residual stenosis.   1.  High-grade in-stent restenosis with of mid right coronary artery stents with TIMI II flow beyond; this was treated with 1 drug-eluting stent.  A stent in the proximal right coronary artery was also placed due to guide trauma.   Recommendations: Dual antiplatelet therapy for at least 6 months and aggressive cardiovascular risk factor management.      Patient Profile     76 y.o. male who was admitted yesterday with symptoms worrisome for in-stent restenosis of his RCA.  He underwent successful catheterization and PCI to RCA  Assessment & Plan    CAD: Status post catheterization yesterday which showed high-grade mid RCA stenosis which was successfully stented and due to catheter induced, approximately an additional stent was placed proximally.  EKG today is stable.  The patient denies any recurrent symptomatology.  We will increase metoprolol to 25 mg twice a day. On DAPT with aspirin/Plavix Hypertension: Patient has been on losartan 100 mg, and metoprolol.  We will increase to 25 mg twice a day. Hyperlipidemia with target less than 70: DL cholesterol 44 on  atorvastatin. Hypothyroidism: On levothyroxine 50 mcg.   Patient is stable for discharge today.  Arrange follow-up with Dr. Acie Fredrickson his primary cardiologist.  Signed, Troy Sine, MD, [facility] 12/17/2021, 9:27 AM

## 2021-12-25 ENCOUNTER — Telehealth (HOSPITAL_COMMUNITY): Payer: Self-pay

## 2021-12-25 NOTE — Telephone Encounter (Signed)
Attempted to call patient in regards to Cardiac Rehab - LM on VM 

## 2021-12-25 NOTE — Telephone Encounter (Signed)
Pt insurance is active and benefits verified through [facility] Co-pay $40.00, DED $0.00/$0.00 met, out of pocket $4,500.00/$3,281.42 met, co-insurance 0%. No pre-authorization required. Passport, 12/25/21 @ 10:26AM, ECX#[id number]   Will contact patient to see if he is interested in the Cardiac Rehab Program. If interested, patient will need to complete follow up appt. Once completed, patient will be contacted for scheduling upon review by the RN Navigator.

## 2021-12-28 ENCOUNTER — Other Ambulatory Visit: Payer: Self-pay

## 2021-12-28 ENCOUNTER — Ambulatory Visit: Payer: Medicare HMO | Admitting: Cardiovascular Disease

## 2021-12-28 ENCOUNTER — Encounter: Payer: Self-pay | Admitting: Cardiovascular Disease

## 2021-12-28 VITALS — BP 130/58 | HR 77 | Ht 70.5 in | Wt 196.0 lb

## 2021-12-28 DIAGNOSIS — Z79899 Other long term (current) drug therapy: Secondary | ICD-10-CM | POA: Diagnosis not present

## 2021-12-28 DIAGNOSIS — I2510 Atherosclerotic heart disease of native coronary artery without angina pectoris: Secondary | ICD-10-CM

## 2021-12-28 DIAGNOSIS — E782 Mixed hyperlipidemia: Secondary | ICD-10-CM

## 2021-12-28 DIAGNOSIS — I10 Essential (primary) hypertension: Secondary | ICD-10-CM

## 2021-12-28 MED ORDER — NITROGLYCERIN 0.4 MG SL SUBL: SUBLINGUAL_TABLET | SUBLINGUAL | 4 refills | Status: DC

## 2021-12-28 NOTE — Progress Notes (Signed)
Patient ID: [name]; [id number]; 1945/11/11   Admit date: (Not on file) Date of Consult: 12/28/2021  Primary Care Provider: Darreld Mclean, MD Primary Cardiologist:  Eura Mccauslin  Primary Electrophysiologist:     Problem list 1.  Coronary artery disease: Status post rotational atherectomy and stenting of the RCA 2.  Hypertension 3.  Hyperlipidemia 4.  Hypothyroidism  Patient Profile:   Brian Mays is a 76 y.o. male with a hx of HTN, , coronary artery disease with a recent episode of weakness and near syncope.  who is being seen today for the evaluation of HTN  at the request of Dr. Marin Olp. He sees Dr. Marin Olp for Carcinoid       Brian Mays is seen wife wife , Brian Mays today   Hx of coronary stenting in March 2017 .   Was started on Metoprolol, he has had intermittent weakness and dizziness/lightheadedness since that time.  He eats and drinks regularly.   Generally gets some regular exercise.  Has had some back issues recently.  He still goes to the line does some exercises that do not cause further back pain.  Dr. Marin Olp asked him to reduce his BP meds.   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  [facility] . Does have some lightheadedness after working out .  No CP or dyspnea while working out.   No synmptoms similar to his MI.    He stopped the Losartan last week He decreased the metoprolol to 12. 5 mg BID last week.   Still eats salty foods. Wife cooks from [employer] .  Eats some pretzels.   Has flushing related to Carcinoid.    Typically occurs if he drinks some beer .   Feb.  12, 2019: Doing well Gaining some weight .   Gave him the OK to walk  No CP or dyspnea   Aug. 27 ,2019:  Doing well.   Feeling better since he decreased his metoprolol in half.   September 15, 2018: Seen back today for follow-up of his coronary artery disease, hypertension,  hyperlipidemia. He increase his losartan 3 weeks ago. No CP  BP readings at home have been elevated.  Is not exercising   August 06, 2019:  Brian Mays is seen back today for follow-up of his coronary artery disease, hypertension, hyperlipidemia. BP has been well controlled at home .  No CP , is active , not doing exercise per se Tries to avoid salty foods.   April 22, 2020:  Brian Mays is seen today for follow up for his CAD, HTN, and hyperlipidemia. Several weeks ago he developed some light headedness.  Was helping a customer unload paint.  Was bending over - up and down repeatedly  Symptoms ~ c/w orthostasis   Went to the ER  Denies any chest pain .   Associated with some fatigue.  Had not skipped any meals,  May have been a bit volume depleted. ( had walked 5 miles the day before )  Labs looked ok .   Troponin were negative x 2.  ECG was normal   Dec. 3, 2021: Brian Mays is seen back today for follow up of his CAD, HTN, HLD Had some orthostasis prior to his previous visit  We reduced  his HCTZ at his last visit  Still eats salty foods regularly  Is not exercising  regularly .   Dec. 2, 2022 Brian Mays is seen back for follow up of his CAD, HTN, HLD No cp Is not as active as he used to be Is having to take care of his wife who had brain cancer surgery  She will need radiation  Has had his 2 covid vaccines and 2 boosters.  Has not had had covid  Wants to get back out on his mountain bike - ive encouraged him to ride / exercise regularly   Feb. 27, 2023 Brian Mays is here to follow up with CAD, HTN, HLD  He here with some anginal-like symptoms in December.  Heart catheterization revealed a mid RCA stenosis of 99%.  This lesion was stented.  He has mild LAD disease. His belching has resolved.   LDL was 44 in Nov. 2023  Had been started on Imdur prior to his procedure.  OK to stop imdur   Past Medical History:  Diagnosis Date   Anemia    in past   Cataract    Colon polyps     Diverticulosis    Gallstones    GERD (gastroesophageal reflux disease)    Heart attack ([place])    mild, Spring 2017   Heart murmur    Hemorrhoid    Hiatal hernia    HTN (hypertension)    Hyperlipidemia    Hypothyroidism    Iron deficiency anemia due to chronic blood loss 03/29/2019   Iron malabsorption 03/29/2019   Metastatic carcinoma ([place]) 2010   Dr Jonette Eva   Neuroendocrine cancer [facility]) 08/2021   Pancreatitis 1998   chronic    Past Surgical History:  Procedure Laterality Date   APPENDECTOMY  1962   CARDIAC CATHETERIZATION N/A 02/10/2016   Procedure: Left Heart Cath and Coronary Angiography;  Surgeon: Adrian Prows, MD;  Location: [facility];  Service: Cardiovascular;  Laterality: N/A;   CARDIAC CATHETERIZATION  02/10/2016   Procedure: Coronary/Graft Atherectomy;  Surgeon: Adrian Prows, MD;  Location: [place] [facility];  Service: Cardiovascular;;   CARDIAC CATHETERIZATION  02/10/2016   Procedure: Coronary Stent Intervention;  Surgeon: Adrian Prows, MD;  Location: [facility];  Service: Cardiovascular;;   CATARACT EXTRACTION W/ INTRAOCULAR LENS  IMPLANT, BILATERAL Bilateral    CORONARY STENT INTERVENTION N/A 12/16/2021   Procedure: CORONARY STENT INTERVENTION;  Surgeon: Early Osmond, MD;  Location: [place] [facility];  Service: Cardiovascular;  Laterality: N/A;   IR GENERIC HISTORICAL  12/30/2015   IR RADIOLOGIST EVAL & MGMT 12/30/2015 Aletta Edouard, MD [facility] INTERV RAD   IR GENERIC HISTORICAL  11/09/2016   IR RADIOLOGIST EVAL & MGMT 11/09/2016 Aletta Edouard, MD [facility] INTERV RAD   IR RADIOLOGIST EVAL & MGMT  03/01/2017   IR RADIOLOGIST EVAL & MGMT  06/29/2017   LAPAROSCOPIC CHOLECYSTECTOMY  1999   LEFT HEART CATH AND CORONARY ANGIOGRAPHY N/A 12/16/2021   Procedure: LEFT HEART CATH AND CORONARY ANGIOGRAPHY;  Surgeon: Early Osmond, MD;  Location: [facility];  Service: Cardiovascular;  Laterality: N/A;   LIVER BIOPSY  2010   RADIOACTIVE SEED IMPLANT  X 3   "to my  liver"   TUMOR EXCISION  01/2009   Carcinoil Resection    TUMOR REMOVAL     from small intestine     Home Medications:  Prior to Admission medications   Medication Sig Start Date End Date Taking? Authorizing Provider  amLODipine (NORVASC) 5 MG tablet Take 5 mg by mouth daily. 03/16/17  Yes [provider]  aspirin EC 81 MG tablet Take 81 mg by mouth every morning.   Yes [provider]  atorvastatin (LIPITOR) 20 MG tablet Take 20 mg daily by mouth.   Yes [provider]  Cholecalciferol 1000 UNITS tablet Take 1,000 Units by mouth daily.     Yes [provider]  fluticasone (FLONASE) 50 MCG/ACT nasal spray Place 2 sprays into both nostrils daily as needed (sinuses).  01/06/15  Yes [provider]  hydrocortisone (ANUSOL-HC) 25 MG suppository INSERT 1 SUPPOSITORY RECTALLY TWICE A DAY AS NEEDED FOR HEMORRHOIDS 02/07/15  Yes [place], Holli Humbles, NP  Lanreotide Acetate (SOMATULINE DEPOT [place]) Inject 120 mcg into the skin every 28 (twenty-eight) days. Receives at Dr Antonieta Pert office   Yes [provider]  lansoprazole (PREVACID) 30 MG capsule Take 1 capsule (30 mg total) by mouth daily at 12 noon. 11/19/16  Yes [place], Gay Filler, MD  levothyroxine (SYNTHROID, LEVOTHROID) 50 MCG tablet Take 1 tablet (50 mcg total) by mouth daily. 08/09/17  Yes [place], Gay Filler, MD  naproxen (NAPROSYN) 500 MG tablet Take 1 tablet (500 mg total) by mouth 2 (two) times daily with a meal. 08/16/17  Yes Leandrew Koyanagi, MD  nitroGLYCERIN (NITROSTAT) 0.4 MG SL tablet Place 1 tablet (0.4 mg total) under the tongue every 5 (five) minutes x 3 doses as needed for chest pain. 02/11/16  Yes Adrian Prows, MD  Omega-3 Fatty Acids (FISH OIL) 1000 MG CAPS Take 1 capsule by mouth daily. Reported on 12/30/2015   Yes [provider]  predniSONE (DELTASONE) 50 MG tablet Take 50mg  at 13 hours, 7 hours, and 1 hour before scan 02/18/17  Yes [place], Holli Humbles, NP  sodium chloride (OCEAN)  0.65 % SOLN nasal spray Place 1 spray into both nostrils as needed for congestion.    Yes [provider]  ticagrelor (BRILINTA) 90 MG TABS tablet Take 1 tablet (90 mg total) by mouth 2 (two) times daily. 02/11/16  Yes Adrian Prows, MD  tiZANidine (ZANAFLEX) 4 MG tablet Take 1 tablet (4 mg total) by mouth every 6 (six) hours as needed for muscle spasms. 08/16/17  Yes Leandrew Koyanagi, MD  vitamin B-12 (CYANOCOBALAMIN) 1000 MCG tablet Take 1,000 mcg by mouth daily.   Yes [provider]  diphenhydrAMINE (BENADRYL) 50 MG tablet Take 1 tablet (50 mg total) by mouth once. Take 1 hour prior to scan. 02/18/17 02/18/17  [place], Holli Humbles, NP  metoprolol tartrate (LOPRESSOR) 25 MG tablet Take 0.5 tablets (12.5 mg total) 2 (two) times daily by mouth. 09/14/17   Hilman Kissling, Wonda Cheng, MD     Allergies:    Allergies  Allergen Reactions   Iohexol Hives     Code: HIVES, Desc: PER [name] @ [facility]IMARY [facility], PT IS ALLERGIC TO CONTRAST DYE 10/02/08/RM  05/01/10...needs full premeds per our protocol w/ [facility]., Onset Date: [id number]     Social History:   Social History   Socioeconomic History   Marital status: Married    Spouse name: Not on file   Number of children: 1   Years of education: Not on file   Highest education level: Not on file  Occupational History   Occupation: Scientist, clinical (histocompatibility and immunogenetics): [employer]  Tobacco Use   Smoking status: Former    Packs/day: 1.50    Years: 35.00    Pack years: 52.50    Types: Cigarettes    Start date: 09/25/1959    Quit date: 12/21/1997    Years  since quitting: 24.0   Smokeless tobacco: Never  Vaping Use   Vaping Use: Never used  Substance and Sexual Activity   Alcohol use: Yes    Alcohol/week: 3.0 - 4.0 standard drinks    Types: 3 - 4 Cans of beer per week    Comment: 4 beers weekly    Drug use: No   Sexual activity: Yes  Other Topics Concern   Not on file  Social History Narrative   Regular Exercise -  NO   Social Determinants of  Health   Financial Resource Strain: Not on file  Food Insecurity: Not on file  Transportation Needs: Not on file  Physical Activity: Not on file  Stress: Not on file  Social Connections: Not on file  Intimate Partner Violence: Not At Risk   Fear of Current or Ex-Partner: No   Emotionally Abused: No   Physically Abused: No   Sexually Abused: No    Family History:    Family History  Problem Relation Age of Onset   Kidney disease Mother    Hyperlipidemia Mother    Hypertension Mother    COPD Father    Ulcerative colitis Daughter    Colon cancer Neg Hx    Esophageal cancer Neg Hx    Rectal cancer Neg Hx    Stomach cancer Neg Hx      ROS:  Noted in current history, all other systems are negative.   Physical Exam: Blood pressure (!) 130/58, pulse 77, height 5' 10.5" (1.791 m), weight 196 lb (88.9 kg), SpO2 97 %.  GEN:  Well nourished, well developed in no acute distress HEENT: Normal NECK: No JVD; No carotid bruits LYMPHATICS: No lymphadenopathy CARDIAC: RRR , soft systolic murmur  RESPIRATORY:  Clear to auscultation without rales, wheezing or rhonchi  ABDOMEN: Soft, non-tender, non-distended MUSCULOSKELETAL:  No edema; No deformity  SKIN: Warm and dry NEUROLOGIC:  Alert and oriented x 3  EKG:       Relevant CV Studies:    Laboratory Data:  ChemistryNo results for input(s): NA, K, CL, CO2, GLUCOSE, BUN, CREATININE, CALCIUM, GFRNONAA, GFRAA, ANIONGAP in the last 168 hours.  No results for input(s): PROT, ALBUMIN, AST, ALT, ALKPHOS, BILITOT in the last 168 hours. HematologyNo results for input(s): WBC, RBC, HGB, HCT, MCV, MCH, MCHC, RDW, PLT in the last 168 hours. Cardiac EnzymesNo results for input(s): TROPONINI in the last 168 hours. No results for input(s): TROPIPOC in the last 168 hours.  BNPNo results for input(s): BNP, PROBNP in the last 168 hours.  DDimer No results for input(s): DDIMER in the last 168 hours.  Radiology/Studies:  No results  found.  Assessment and Plan:     1.  Coronary artery disease:  s/p stenting of his mid RCA .   Feels much better  His nausea is gone.  Belching has resolved.    2.  Near syncope: no recurrence    3.  Hypertension:        BP is well controlled.  Cont current meds.   4.  Hyperlipidemia:   cont atorva.  LDL was 44.  Recheck labs in 6 months with office visit with APP  5.  Systolic murmur,    has a hx of AI.   Repeat echo has been ordered.     For questions or updates, please contact [name] Please consult [email or web address] for contact info under Cardiology/STEMI.   Signed, Mertie Moores, MD  12/28/2021 1:48 PM

## 2021-12-28 NOTE — Patient Instructions (Signed)
Medication Instructions:  Please discontinue your Isosorbide. Continue all other medications as listed.  *If you need a refill on your cardiac medications before your next appointment, please call your pharmacy*  Lab Work: Please have blood work in 6 months (Lipid, ALT and BMP)  If you have labs (blood work) drawn today and your tests are completely normal, you will receive your results only by: [place] (if you have MyChart) OR A paper copy in the mail If you have any lab test that is abnormal or we need to change your treatment, we will call you to review the results.   Testing/Procedures: Your physician has requested that you have an echocardiogram. Echocardiography is a painless test that uses sound waves to create images of your heart. It provides your doctor with information about the size and shape of your heart and how well your hearts chambers and valves are working. This procedure takes approximately one hour. There are no restrictions for this procedure.  Follow-Up: At [facility], you and your health needs are our priority.  As part of our continuing mission to provide you with exceptional heart care, we have created designated Provider Care Teams.  These Care Teams include your primary Cardiologist (physician) and Advanced Practice Providers (APPs -  Physician Assistants and Nurse Practitioners) who all work together to provide you with the care you need, when you need it.  We recommend signing up for the patient portal called "MyChart".  Sign up information is provided on this After Visit Summary.  MyChart is used to connect with patients for Virtual Visits (Telemedicine).  Patients are able to view lab/test results, encounter notes, upcoming appointments, etc.  Non-urgent messages can be sent to your provider as well.   To learn more about what you can do with MyChart, go to [email or web address].    Your next appointment:   6 month(s)  The format for your next  appointment:   In Person  Provider:   Mertie Moores, MD or PA/NP.  Thank you for choosing [facility]!!

## 2021-12-29 ENCOUNTER — Inpatient Hospital Stay: Payer: Medicare HMO

## 2021-12-29 ENCOUNTER — Ambulatory Visit: Payer: Medicare HMO

## 2021-12-29 ENCOUNTER — Ambulatory Visit: Payer: Medicare HMO | Admitting: Hematology & Oncology

## 2021-12-29 ENCOUNTER — Telehealth: Payer: Self-pay | Admitting: Family Medicine

## 2021-12-29 NOTE — Telephone Encounter (Signed)
Left message for patient to call back and schedule Medicare Annual Wellness Visit (AWV) in office.   If not able to come in office, please offer to do virtually or by telephone.  Left office number and my jabber [phone].  Last AWV:03/08/2016  Please schedule at anytime with Nurse Health Advisor.

## 2022-01-01 ENCOUNTER — Inpatient Hospital Stay (HOSPITAL_BASED_OUTPATIENT_CLINIC_OR_DEPARTMENT_OTHER): Payer: Medicare HMO | Admitting: Hematology & Oncology

## 2022-01-01 ENCOUNTER — Encounter: Payer: Self-pay | Admitting: Hematology & Oncology

## 2022-01-01 ENCOUNTER — Other Ambulatory Visit: Payer: Self-pay

## 2022-01-01 ENCOUNTER — Inpatient Hospital Stay: Payer: Medicare HMO

## 2022-01-01 ENCOUNTER — Inpatient Hospital Stay: Payer: Medicare HMO | Attending: Hematology & Oncology

## 2022-01-01 VITALS — BP 140/60 | HR 65 | Temp 98.7°F | Resp 17 | Wt 189.0 lb

## 2022-01-01 DIAGNOSIS — D500 Iron deficiency anemia secondary to blood loss (chronic): Secondary | ICD-10-CM

## 2022-01-01 DIAGNOSIS — Z79899 Other long term (current) drug therapy: Secondary | ICD-10-CM | POA: Diagnosis not present

## 2022-01-01 DIAGNOSIS — D509 Iron deficiency anemia, unspecified: Secondary | ICD-10-CM | POA: Diagnosis not present

## 2022-01-01 DIAGNOSIS — C7B8 Other secondary neuroendocrine tumors: Secondary | ICD-10-CM

## 2022-01-01 DIAGNOSIS — C7A8 Other malignant neuroendocrine tumors: Secondary | ICD-10-CM | POA: Insufficient documentation

## 2022-01-01 DIAGNOSIS — I2510 Atherosclerotic heart disease of native coronary artery without angina pectoris: Secondary | ICD-10-CM | POA: Insufficient documentation

## 2022-01-01 LAB — CMP (CANCER CENTER ONLY)
ALT: 13 U/L (ref 0–44)
AST: 21 U/L (ref 15–41)
Albumin: 3.6 g/dL (ref 3.5–5.0)
Alkaline Phosphatase: 55 U/L (ref 38–126)
Anion gap: 7 (ref 5–15)
BUN: 15 mg/dL (ref 8–23)
CO2: 31 mmol/L (ref 22–32)
Calcium: 9.2 mg/dL (ref 8.9–10.3)
Chloride: 101 mmol/L (ref 98–111)
Creatinine: 0.93 mg/dL (ref 0.61–1.24)
GFR, Estimated: >60 mL/min (ref >60)
Glucose, Bld: 169 mg/dL — ABNORMAL HIGH (ref 70–99)
Potassium: 4.4 mmol/L (ref 3.5–5.1)
Sodium: 139 mmol/L (ref 135–145)
Total Bilirubin: 0.7 mg/dL (ref 0.3–1.2)
Total Protein: 7 g/dL (ref 6.5–8.1)

## 2022-01-01 LAB — CBC WITH DIFFERENTIAL (CANCER CENTER ONLY)
Abs Immature Granulocytes: 0.02 10*3/uL (ref 0.00–0.07)
Basophils Absolute: 0 10*3/uL (ref 0.0–0.1)
Basophils Relative: 1 %
Eosinophils Absolute: 0.3 10*3/uL (ref 0.0–0.5)
Eosinophils Relative: 7 %
HCT: 36 % — ABNORMAL LOW (ref 39.0–52.0)
Hemoglobin: 11.8 g/dL — ABNORMAL LOW (ref 13.0–17.0)
Immature Granulocytes: 0 %
Lymphocytes Relative: 12 %
Lymphs Abs: 0.6 10*3/uL — ABNORMAL LOW (ref 0.7–4.0)
MCH: 29.9 pg (ref 26.0–34.0)
MCHC: 32.8 g/dL (ref 30.0–36.0)
MCV: 91.1 fL (ref 80.0–100.0)
Monocytes Absolute: 0.3 10*3/uL (ref 0.1–1.0)
Monocytes Relative: 5 %
Neutro Abs: 3.6 10*3/uL (ref 1.7–7.7)
Neutrophils Relative %: 75 %
Platelet Count: 147 10*3/uL — ABNORMAL LOW (ref 150–400)
RBC: 3.95 MIL/uL — ABNORMAL LOW (ref 4.22–5.81)
RDW: 13.9 % (ref 11.5–15.5)
WBC Count: 4.8 10*3/uL (ref 4.0–10.5)
nRBC: 0 % (ref 0.0–0.2)

## 2022-01-01 LAB — IRON AND IRON BINDING CAPACITY (CC-WL,HP ONLY)
Iron: 72 ug/dL (ref 45–182)
Saturation Ratios: 25 % (ref 17.9–39.5)
TIBC: 294 ug/dL (ref 250–450)
UIBC: 222 ug/dL (ref 117–376)

## 2022-01-01 LAB — FERRITIN: Ferritin: 312 ng/mL (ref 24–336)

## 2022-01-01 MED ORDER — LANREOTIDE ACETATE 120 MG/0.5ML ~~LOC~~ SOLN
120.0000 mg | Freq: Once | SUBCUTANEOUS | Status: AC
  Administered 2022-01-01: 120 mg via SUBCUTANEOUS
  Filled 2022-01-01: qty 120

## 2022-01-01 NOTE — Patient Instructions (Signed)
[facility] [place]  Discharge Instructions: ?Thank you for choosing [facility] to provide your oncology and hematology care.  ? ?If you have a lab appointment with the [facility], please go directly to the [facility] and check in at the registration area. ? ?Wear comfortable clothing and clothing appropriate for easy access to any Portacath or PICC line.  ? ?We strive to give you quality time with your provider. You may need to reschedule your appointment if you arrive late (15 or more minutes).  Arriving late affects you and other patients whose appointments are after yours.  Also, if you miss three or more appointments without notifying the office, you may be dismissed from the clinic at the provider?s discretion.    ?  ?For prescription refill requests, have your pharmacy contact our office and allow 72 hours for refills to be completed.   ? ?Today you received the following chemotherapy and/or immunotherapy agents Somatuline    ?  ?To help prevent nausea and vomiting after your treatment, we encourage you to take your nausea medication as directed. ? ?BELOW ARE SYMPTOMS THAT SHOULD BE REPORTED IMMEDIATELY: ?*FEVER GREATER THAN 100.4 F (38 ?C) OR HIGHER ?*CHILLS OR SWEATING ?*NAUSEA AND VOMITING THAT IS NOT CONTROLLED WITH YOUR NAUSEA MEDICATION ?*UNUSUAL SHORTNESS OF BREATH ?*UNUSUAL BRUISING OR BLEEDING ?*URINARY PROBLEMS (pain or burning when urinating, or frequent urination) ?*BOWEL PROBLEMS (unusual diarrhea, constipation, pain near the anus) ?TENDERNESS IN MOUTH AND THROAT WITH OR WITHOUT PRESENCE OF ULCERS (sore throat, sores in mouth, or a toothache) ?UNUSUAL RASH, SWELLING OR PAIN  ?UNUSUAL VAGINAL DISCHARGE OR ITCHING  ? ?Items with * indicate a potential emergency and should be followed up as soon as possible or go to the Emergency Department if any problems should occur. ? ?Please show the CHEMOTHERAPY ALERT CARD or IMMUNOTHERAPY ALERT CARD at check-in to the  Emergency Department and triage nurse. ?Should you have questions after your visit or need to cancel or reschedule your appointment, please contact [facility]  [phone] and follow the prompts.  Office hours are 8:00 a.m. to 4:30 p.m. Monday - Friday. Please note that voicemails left after 4:00 p.m. may not be returned until the following business day.  We are closed weekends and major holidays. You have access to a nurse at all times for urgent questions. Please call the main number to the clinic [phone] and follow the prompts. ? ?For any non-urgent questions, you may also contact your provider using MyChart. We now offer e-Visits for anyone 44 and older to request care online for non-urgent symptoms. For details visit [email or web address]. ?  ?Also download the MyChart app! Go to the app store, search "MyChart", open the app, select Outlook, and log in with your MyChart username and password. ? ?Due to Covid, a mask is required upon entering the hospital/clinic. If you do not have a mask, one will be given to you upon arrival. For doctor visits, patients may have 1 support person aged 59 or older with them. For treatment visits, patients cannot have anyone with them due to current Covid guidelines and our immunocompromised population.  ?

## 2022-01-01 NOTE — Progress Notes (Signed)
Hematology and Oncology Follow Up Visit  Brian Mays [id number] 11-06-1945 76 y.o. 01/01/2022   Principle Diagnosis:  Metastatic low grade neuroendocrine tumor-hepatic metastases Iron deficiency anemia   Past Therapy: S/p yttrium-90 intrahepatic therapy - November 2016 Lutathera (Lu 177) injection on 02/01/2018 - s/p cycle 4 SBRT to T6 lesion -- 11/06/2021   Current Therapy:        Somatuline 120 mg monthly IV Iron as indicated    Interim History:  Brian Mays is here today for follow-up.  Shockingly, he had a cardiac cath a couple weeks ago.  When we last saw him, he is complaining of some "indigestion".  He was seen by Dr. Henrene Pastor.  He had a negative upper endoscopy.  He was seen by cardiology.  He had a cardiac cath.  He had a's stenosis of the right coronary artery.  He subsequently underwent a cardiac cath in [place].  He is on Plavix and low-dose aspirin right now.  He feels better.  Unfortunate, his wife is not doing well.  She has been having some seizures.  She had a MRI of the brain recently.  Hopefully, we will not show the tumor is growing back already.  His last Chromogranin A level was 136.  His last PET scan was back in October.  We will going to have to get another one and see how everything looks.  He has had no diarrhea.  He has had no rashes.  There is been no leg swelling.  He has had no cough or shortness of breath.  His last iron studies back in January showed a ferritin 306 with an iron saturation of 21%.  Overall, his performance status right now is ECOG 1.     Medications:  Allergies as of 01/01/2022       Reactions   Iohexol Hives    Code: HIVES, Desc: PER [name] @ [facility], PT IS ALLERGIC TO CONTRAST DYE 10/02/08/RM  05/01/10...needs full premeds per our protocol w/ [facility]., Onset Date: [id number]        Medication List        Accurate as of January 01, 2022  9:29 AM. If you have any questions, ask your nurse or doctor.          STOP taking  these medications    predniSONE 50 MG tablet Commonly known as: DELTASONE Stopped by: Volanda Napoleon, MD       TAKE these medications    aspirin EC 81 MG tablet Take 81 mg by mouth every morning.   atorvastatin 20 MG tablet Commonly known as: LIPITOR TAKE 1 TABLET BY MOUTH DAILY AT 6 PM.   Cholecalciferol 25 MCG (1000 UT) tablet Take 1,000 Units by mouth 2 (two) times daily.   clopidogrel 75 MG tablet Commonly known as: PLAVIX TAKE 1 TABLET BY MOUTH EVERY DAY   diphenhydramine-acetaminophen 25-500 MG Tabs tablet Commonly known as: TYLENOL PM Take 1 tablet by mouth at bedtime as needed (sleep/pain).   fluticasone 50 MCG/ACT nasal spray Commonly known as: FLONASE Place 2 sprays into both nostrils daily as needed (sinuses).   hydrochlorothiazide 25 MG tablet Commonly known as: HYDRODIURIL Take 0.5 tablets (12.5 mg total) by mouth daily.   hydrocortisone 25 MG suppository Commonly known as: ANUSOL-HC INSERT 1 SUPPOSITORY RECTALLY TWICE A DAY AS NEEDED FOR HEMORRHOIDS   ipratropium 0.03 % nasal spray Commonly known as: Atrovent Place 2 sprays into the nose 3 (three) times daily. What changed:  when to take  this reasons to take this   lansoprazole 30 MG capsule Commonly known as: PREVACID Take 1 capsule (30 mg total) by mouth daily at 12 noon.   levothyroxine 50 MCG tablet Commonly known as: SYNTHROID TAKE 1 TABLET BY MOUTH EVERY DAY BEFORE BREAKFAST   losartan 100 MG tablet Commonly known as: COZAAR TAKE 1 TABLET BY MOUTH EVERY DAY   metoprolol tartrate 25 MG tablet Commonly known as: LOPRESSOR Take 1 tablet (25 mg total) by mouth 2 (two) times daily.   nitroGLYCERIN 0.4 MG SL tablet Commonly known as: NITROSTAT PLACE 1 TABLET UNDER THE TONGUE EVERY 5 (FIVE) MINUTES X 3 DOSES AS NEEDED FOR CHEST PAIN.   polyethylene glycol 17 g packet Commonly known as: MIRALAX / GLYCOLAX Take 17 g by mouth daily.   SOMATULINE DEPOT [place] Inject 120 mcg into the skin  every 28 (twenty-eight) days. Receives at Dr Antonieta Pert office   SOOTHE XP OP Place 1 drop into both eyes daily as needed (dry eyes).   vitamin B-12 1000 MCG tablet Commonly known as: CYANOCOBALAMIN Take 1,000 mcg by mouth every other day.        Allergies:  Allergies  Allergen Reactions   Iohexol Hives     Code: HIVES, Desc: PER [name] @ [facility], PT IS ALLERGIC TO CONTRAST DYE 10/02/08/RM  05/01/10...needs full premeds per our protocol w/ [facility]., Onset Date: [id number]     Past Medical History, Surgical history, Social history, and Family History were reviewed and updated.  Review of Systems: Review of Systems  Constitutional: Negative.   HENT: Negative.    Eyes: Negative.   Respiratory: Negative.    Cardiovascular: Negative.   Gastrointestinal: Negative.   Genitourinary: Negative.   Musculoskeletal: Negative.   Skin: Negative.   Neurological: Negative.   Endo/Heme/Allergies: Negative.   Psychiatric/Behavioral: Negative.      Physical Exam:  weight is 189 lb (85.7 kg). His oral temperature is 98.7 F (37.1 C). His blood pressure is 140/60 and his pulse is 65. His respiration is 17 and oxygen saturation is 100%.   Wt Readings from Last 3 Encounters:  01/01/22 189 lb (85.7 kg)  12/28/21 196 lb (88.9 kg)  12/16/21 194 lb 0.1 oz (88 kg)    Physical Exam Vitals reviewed.  HENT:     Head: Normocephalic and atraumatic.  Eyes:     Pupils: Pupils are equal, round, and reactive to light.  Cardiovascular:     Rate and Rhythm: Normal rate and regular rhythm.     Heart sounds: Normal heart sounds.  Pulmonary:     Effort: Pulmonary effort is normal.     Breath sounds: Normal breath sounds.  Abdominal:     General: Bowel sounds are normal.     Palpations: Abdomen is soft.  Musculoskeletal:        General: No tenderness or deformity. Normal range of motion.     Cervical back: Normal range of motion.  Lymphadenopathy:     Cervical: No cervical adenopathy.   Skin:    General: Skin is warm and dry.     Findings: No erythema or rash.  Neurological:     Mental Status: He is alert and oriented to person, place, and time.  Psychiatric:        Behavior: Behavior normal.        Thought Content: Thought content normal.        Judgment: Judgment normal.     Lab Results  Component Value Date   WBC  4.8 01/01/2022   HGB 11.8 (L) 01/01/2022   HCT 36.0 (L) 01/01/2022   MCV 91.1 01/01/2022   PLT 147 (L) 01/01/2022   Lab Results  Component Value Date   FERRITIN 306 12/01/2021   IRON 62 12/01/2021   TIBC 295 12/01/2021   UIBC 233 12/01/2021   IRONPCTSAT 21 12/01/2021   Lab Results  Component Value Date   RETICCTPCT 1.5 07/23/2021   RBC 3.95 (L) 01/01/2022   No results found for: KPAFRELGTCHN, LAMBDASER, KAPLAMBRATIO No results found for: IGGSERUM, IGA, IGMSERUM No results found for: Odetta Pink, SPEI   Chemistry      Component Value Date/Time   NA 139 01/01/2022 0836   NA 139 12/11/2021 1221   NA 144 10/04/2017 0904   NA 141 07/23/2016 1255   K 4.4 01/01/2022 0836   K 3.8 10/04/2017 0904   K 4.1 07/23/2016 1255   CL 101 01/01/2022 0836   CL 103 10/04/2017 0904   CO2 31 01/01/2022 0836   CO2 28 10/04/2017 0904   CO2 27 07/23/2016 1255   BUN 15 01/01/2022 0836   BUN 13 12/11/2021 1221   BUN 14 10/04/2017 0904   BUN 13.8 07/23/2016 1255   CREATININE 0.93 01/01/2022 0836   CREATININE 0.8 10/04/2017 0904   CREATININE 1.0 07/23/2016 1255      Component Value Date/Time   CALCIUM 9.2 01/01/2022 0836   CALCIUM 9.4 10/04/2017 0904   CALCIUM 9.2 07/23/2016 1255   ALKPHOS 55 01/01/2022 0836   ALKPHOS 64 10/04/2017 0904   ALKPHOS 77 07/23/2016 1255   AST 21 01/01/2022 0836   AST 23 07/23/2016 1255   ALT 13 01/01/2022 0836   ALT 22 10/04/2017 0904   ALT 13 07/23/2016 1255   BILITOT 0.7 01/01/2022 0836   BILITOT 1.13 07/23/2016 1255       Impression and Plan: Mr. Reiling  is a very pleasant 76 yo caucasian gentleman with metastatic low-grade neuroendocrine carcinoma with hepatic metastasis.    He completed radiation therapy to T6.  He was not symptomatic in that area but yet, I felt that radiation to that area would be helpful.  Again, we will set up a PET scan for him.  Hopefully, we will see that everything is still looking good.  He did have the radiation therapy to T6.  We will see how that area looks.  Hopefully, he will not have any further cardiac issues.  He will get his Somatuline today.  I will plan to see him back in 1 month.  Volanda Napoleon, MD 3/3/20239:29 AM

## 2022-01-04 DIAGNOSIS — R42 Dizziness and giddiness: Secondary | ICD-10-CM | POA: Diagnosis not present

## 2022-01-04 DIAGNOSIS — I200 Unstable angina: Secondary | ICD-10-CM | POA: Diagnosis not present

## 2022-01-04 DIAGNOSIS — D3A00 Benign carcinoid tumor of unspecified site: Secondary | ICD-10-CM | POA: Diagnosis not present

## 2022-01-04 DIAGNOSIS — R110 Nausea: Secondary | ICD-10-CM | POA: Diagnosis not present

## 2022-01-04 DIAGNOSIS — I25119 Atherosclerotic heart disease of native coronary artery with unspecified angina pectoris: Secondary | ICD-10-CM | POA: Diagnosis not present

## 2022-01-04 DIAGNOSIS — R0789 Other chest pain: Secondary | ICD-10-CM | POA: Diagnosis not present

## 2022-01-04 DIAGNOSIS — Z8505 Personal history of malignant neoplasm of liver: Secondary | ICD-10-CM | POA: Diagnosis not present

## 2022-01-04 DIAGNOSIS — Z87891 Personal history of nicotine dependence: Secondary | ICD-10-CM | POA: Diagnosis not present

## 2022-01-04 DIAGNOSIS — Z85030 Personal history of malignant carcinoid tumor of large intestine: Secondary | ICD-10-CM | POA: Diagnosis not present

## 2022-01-04 DIAGNOSIS — E039 Hypothyroidism, unspecified: Secondary | ICD-10-CM | POA: Diagnosis not present

## 2022-01-04 DIAGNOSIS — K859 Acute pancreatitis without necrosis or infection, unspecified: Secondary | ICD-10-CM | POA: Diagnosis not present

## 2022-01-04 DIAGNOSIS — I25110 Atherosclerotic heart disease of native coronary artery with unstable angina pectoris: Secondary | ICD-10-CM | POA: Diagnosis not present

## 2022-01-04 DIAGNOSIS — Z743 Need for continuous supervision: Secondary | ICD-10-CM | POA: Diagnosis not present

## 2022-01-04 DIAGNOSIS — R12 Heartburn: Secondary | ICD-10-CM | POA: Diagnosis not present

## 2022-01-04 DIAGNOSIS — E7800 Pure hypercholesterolemia, unspecified: Secondary | ICD-10-CM | POA: Diagnosis not present

## 2022-01-04 DIAGNOSIS — K8050 Calculus of bile duct without cholangitis or cholecystitis without obstruction: Secondary | ICD-10-CM | POA: Diagnosis not present

## 2022-01-04 DIAGNOSIS — R079 Chest pain, unspecified: Secondary | ICD-10-CM | POA: Diagnosis not present

## 2022-01-05 ENCOUNTER — Encounter: Payer: Medicare HMO | Admitting: Internal Medicine

## 2022-01-05 DIAGNOSIS — I25110 Atherosclerotic heart disease of native coronary artery with unstable angina pectoris: Secondary | ICD-10-CM | POA: Diagnosis not present

## 2022-01-05 DIAGNOSIS — I25119 Atherosclerotic heart disease of native coronary artery with unspecified angina pectoris: Secondary | ICD-10-CM | POA: Diagnosis not present

## 2022-01-05 DIAGNOSIS — I080 Rheumatic disorders of both mitral and aortic valves: Secondary | ICD-10-CM | POA: Diagnosis not present

## 2022-01-05 DIAGNOSIS — R079 Chest pain, unspecified: Secondary | ICD-10-CM | POA: Diagnosis not present

## 2022-01-05 DIAGNOSIS — I517 Cardiomegaly: Secondary | ICD-10-CM | POA: Diagnosis not present

## 2022-01-05 LAB — CHROMOGRANIN A: Chromogranin A (ng/mL): 211 ng/mL — ABNORMAL HIGH (ref 0.0–101.8)

## 2022-01-06 DIAGNOSIS — I25119 Atherosclerotic heart disease of native coronary artery with unspecified angina pectoris: Secondary | ICD-10-CM | POA: Diagnosis not present

## 2022-01-07 ENCOUNTER — Encounter: Payer: Self-pay | Admitting: Family Medicine

## 2022-01-07 ENCOUNTER — Encounter: Payer: Self-pay | Admitting: Cardiovascular Disease

## 2022-01-11 ENCOUNTER — Encounter: Payer: Self-pay | Admitting: Cardiovascular Disease

## 2022-01-11 NOTE — Telephone Encounter (Signed)
Patient's echocardiogram results form [facility]: ? ?Impressions  ?01/05/2022 9:39 AM EST   ?Left Ventricle: There is mild hypertrophy. ?  Left Ventricle: Systolic function is normal. EF: 60-65%. Ejection  ?fraction measured by 3D is 62%, which is normal. ?  Left Ventricle: Doppler parameters consistent with mild diastolic  ?dysfunction and low to normal LA pressure. ?  Left Atrium: Left atrium is mildly dilated. ?  Aortic Valve: There is mild sclerosis. ?  Aortic Valve: Mild to moderate aortic valve regurgitation. ?  Mitral Valve: The leaflets are mildly thickened. ?  Mitral Valve: There is mild annular calcification. ?  Mitral Valve: There is mild to moderate regurgitation. ?    ? ?Results - Echocardiogram Complete WO Enhancing Agent (01/05/2022 9:32 AM EST) ?Narrative  ?01/05/2022 9:39 AM EST   ?This result has an attachment that is not available.   ? ?Left Ventricle ?Left ventricle size is normal. There is mild hypertrophy. Systolic function is normal. EF: 60-65%. Ejection fraction measured by 3D is 62%, which is normal. Wall motion is normal. Doppler parameters consistent with mild diastolic dysfunction and low to normal LA pressure. ? ?Right Ventricle ?Right ventricle size is normal. Systolic function is normal. ? ?Left Atrium ?Left atrium is mildly dilated. Atrial septum appears intact. ? ?Right Atrium ?Right atrium size is normal. ? ?IVC/SVC ?The inferior vena cava demonstrates a diameter of <=2.1 cm and collapses >50%; therefore, the right atrial pressure is estimated at 3 mmHg. ? ?Mitral Valve ?The leaflets are mildly thickened. There is mild annular calcification. There is mild to moderate regurgitation. ? ?Tricuspid Valve ?Tricuspid valve structure is normal. There is trace regurgitation. There is no evidence of tricuspid valve stenosis. The right ventricular systolic pressure is normal (<36 mmHg). ? ?Aortic Valve ?There is mild sclerosis. Mild to moderate aortic valve regurgitation. ? ?Pulmonic  Valve ?The pulmonic valve was not well visualized. Trace regurgitation. No pulmonic stenosis present. ? ?Ascending Aorta ?The aortic root is normal in size. The ascending aorta is normal in size. ? ?Pericardium ?There is no pericardial effusion. ? ?Study Details ?A complete echo was performed using complete 2D, color flow Doppler, spectral Doppler and 3D imaging. ? ?Wall Scoring Baseline ?Score Index: 1.00 ?The left ventricular wall motion is normal.  ? ?

## 2022-01-14 ENCOUNTER — Other Ambulatory Visit (HOSPITAL_COMMUNITY): Payer: Medicare HMO

## 2022-01-14 ENCOUNTER — Encounter (HOSPITAL_COMMUNITY): Payer: Self-pay

## 2022-01-22 ENCOUNTER — Ambulatory Visit: Payer: Medicare HMO | Admitting: Nurse Practitioner

## 2022-01-22 ENCOUNTER — Telehealth (HOSPITAL_COMMUNITY): Payer: Self-pay

## 2022-01-22 NOTE — Telephone Encounter (Signed)
Called and spoke with pt in regards to CR, pt stated he is not interested at this time.   Closed referral 

## 2022-01-26 ENCOUNTER — Telehealth: Payer: Self-pay | Admitting: *Deleted

## 2022-01-26 NOTE — Telephone Encounter (Signed)
Call from Radiology regarding pt PET scan. Pt must be off Somatuline x28 days to have PET scan due to interaction of drugs. Pt due for Somatuline this Friday. Advised his appt with be pushed out until after PET completed. Rad tech advised she will schedule PET for week of April 11. ?Reviewed with MD, pt will skip somatuline injection this month and resume in April after PET completed. ?Message to scheduling to  reschedule 3/31 lab/MD/injection appt for after PET scan. ?Notified pt. ?

## 2022-01-29 ENCOUNTER — Inpatient Hospital Stay: Payer: Medicare HMO | Admitting: Hematology & Oncology

## 2022-01-29 ENCOUNTER — Inpatient Hospital Stay: Payer: Medicare HMO

## 2022-02-04 ENCOUNTER — Ambulatory Visit (HOSPITAL_COMMUNITY)
Admission: RE | Admit: 2022-02-04 | Discharge: 2022-02-04 | Disposition: A | Discharge status: Home / Self Care | Payer: Medicare HMO | Source: Ambulatory Visit | Attending: Radiation Oncology | Admitting: Radiation Oncology

## 2022-02-04 DIAGNOSIS — C7951 Secondary malignant neoplasm of bone: Secondary | ICD-10-CM | POA: Diagnosis not present

## 2022-02-04 DIAGNOSIS — D1800 Hemangioma unspecified site: Secondary | ICD-10-CM | POA: Diagnosis not present

## 2022-02-04 IMAGING — MR MR THORACIC SPINE WO/W CM
4 of 9 series · 18 of 48 positions shown · IV contrast (gadavist)
Comparison: CT chest/abdomen/pelvis [DATE]. Thoracic spine MRI
[DATE]. Thoracic spine MRI [DATE].

CLINICAL DATA: Metastasis to spinal column. Metastatic disease
evaluation; follow-up treated T6 and T12.

EXAM:
MRI THORACIC WITHOUT AND WITH CONTRAST
TECHNIQUE: Multiplanar and multiecho pulse sequences of the thoracic spine were
obtained without and with intravenous contrast.
CONTRAST:  9mL GADAVIST GADOBUTROL 1 MMOL/ML IV SOLN

[Series 5: T2 · sagittal · 3.0mm · 0.62mm/px · 3 of 15 slices shown (1 of 2)]
[im 1/15]
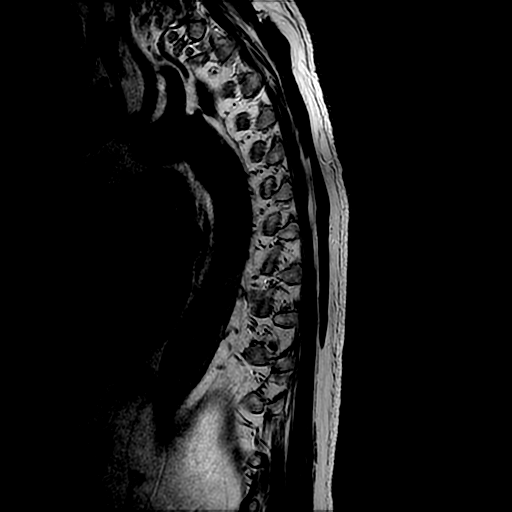
[im 8/15]
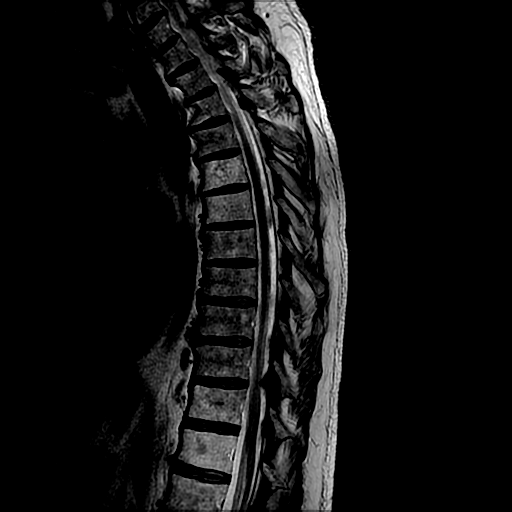
[im 15/15]
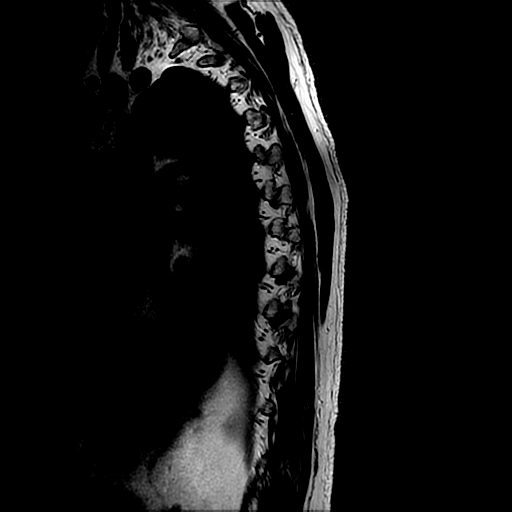

[Series 6: T1 · sagittal · 3.0mm · 0.62mm/px · 3 of 15 slices shown (1 of 2)]
[im 1/15]
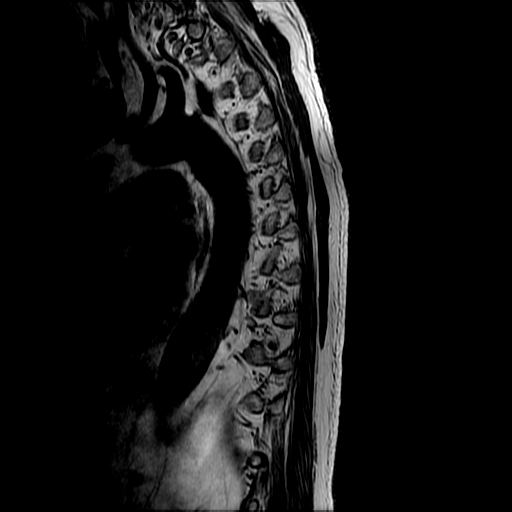
[im 8/15]
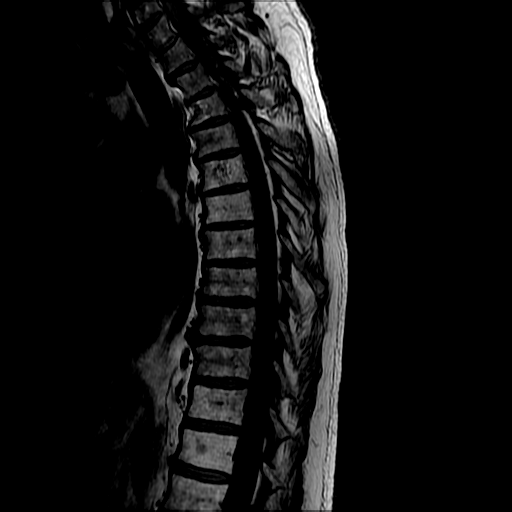
[im 15/15]
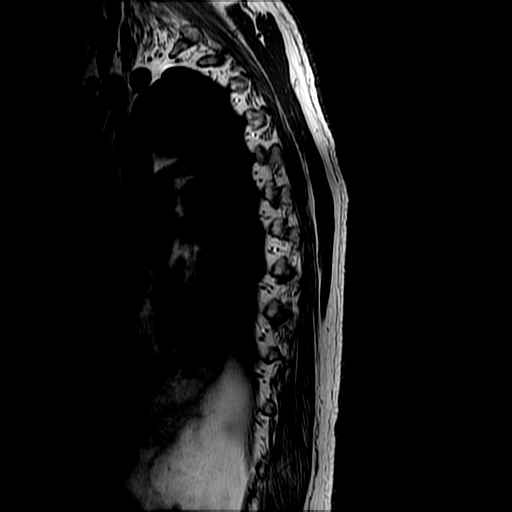

[Series 8: T2 · axial · 4.0mm · 0.43mm/px · z∈[-273,-60]mm · 9 of 41 slices shown (2 of 2)]
[im 1/41]
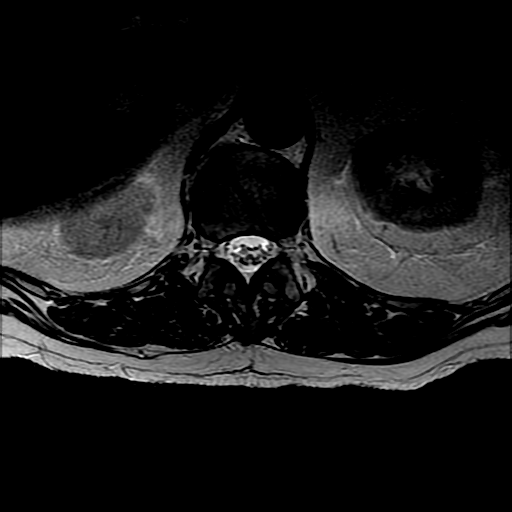
[im 6/41]
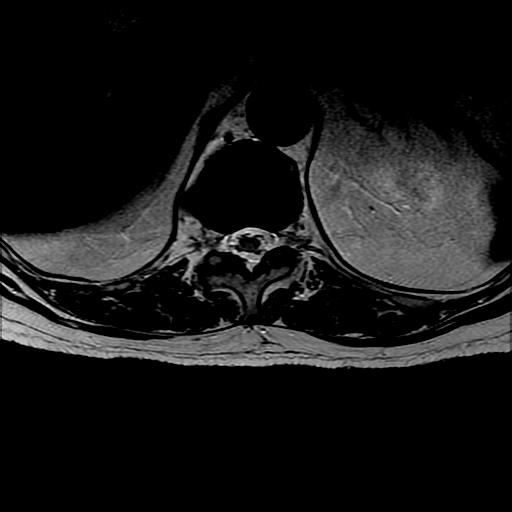
[im 11/41]
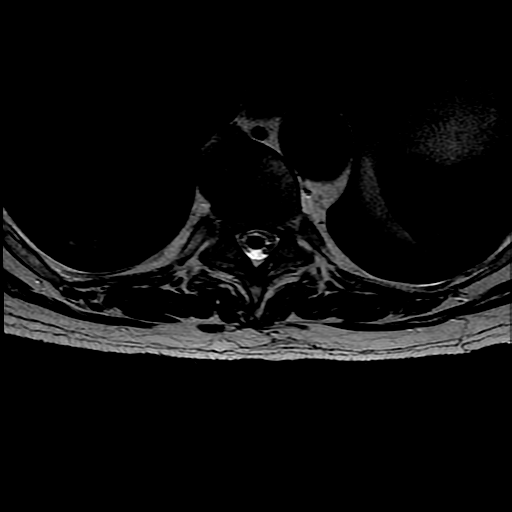
[im 16/41]
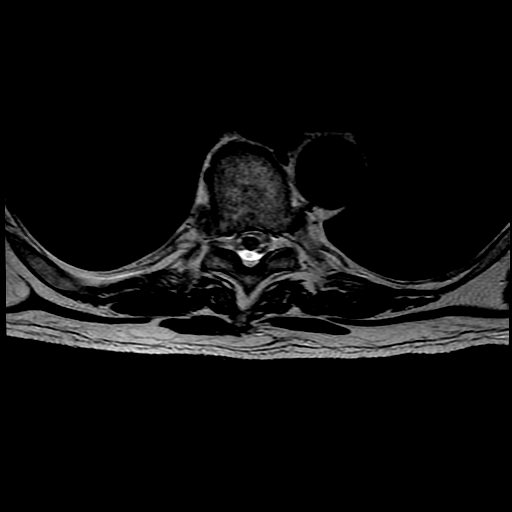
[im 21/41]
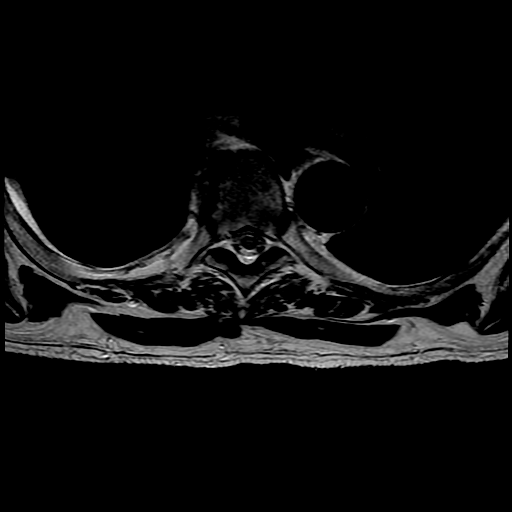
[im 26/41]
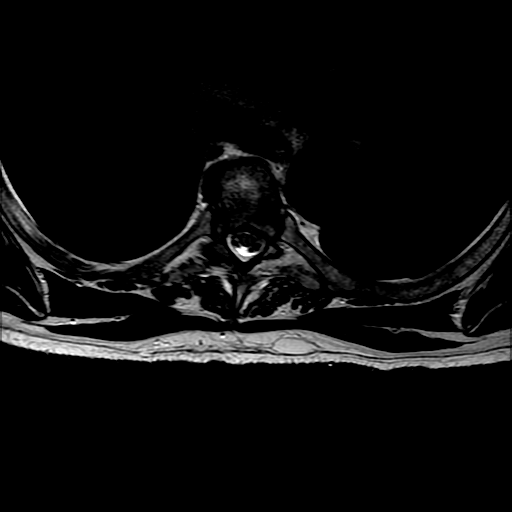
[im 31/41]
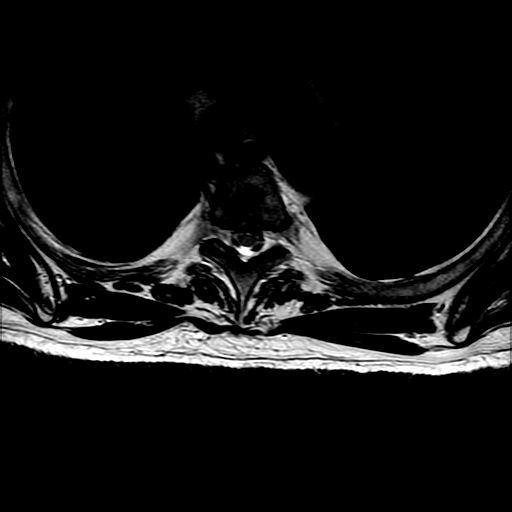
[im 36/41]
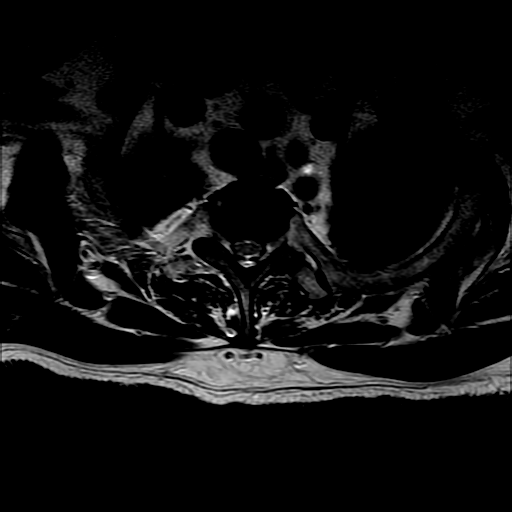
[im 41/41]
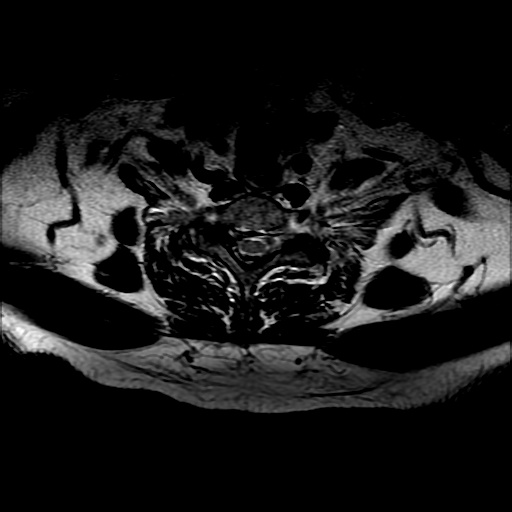

[Series 10: T1 · axial · non-contrast · 4.0mm · 0.39mm/px · z∈[-243,-84]mm · 3 of 40 slices shown (2 of 2)]
[im 6/40]
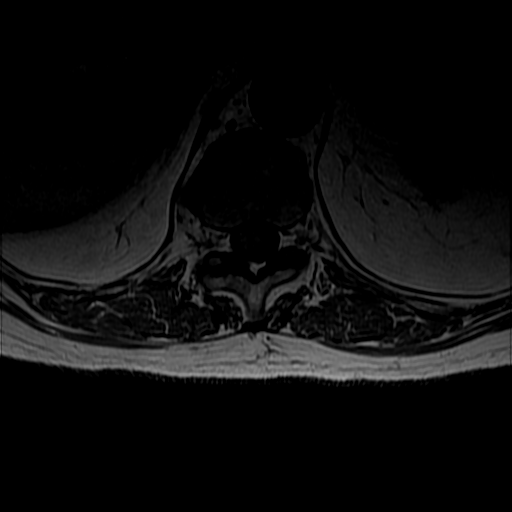
[im 23/40]
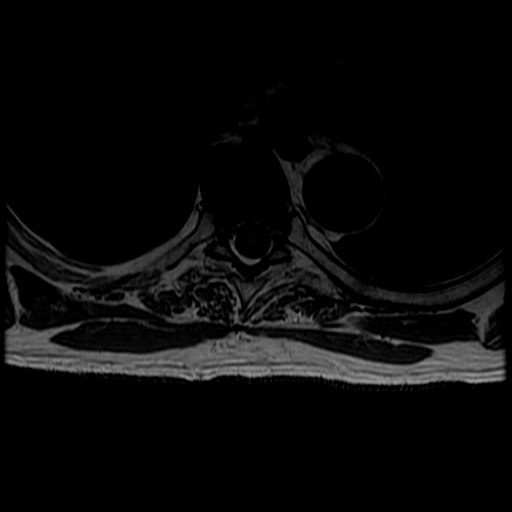
[im 34/40]
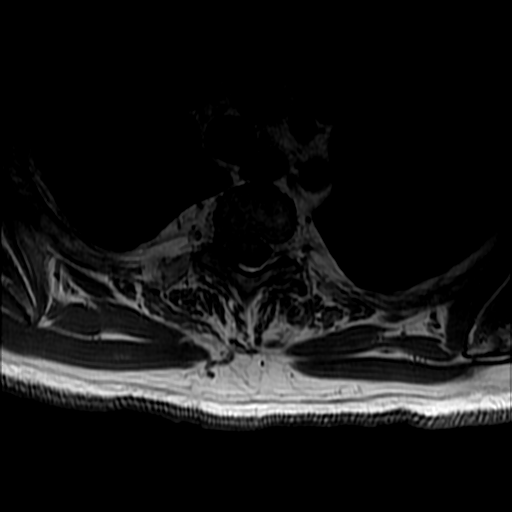

[18 of 48 positions shown; findings below may reference images not displayed]

FINDINGS: Alignment:  No significant spondylolisthesis.

Vertebrae: T2 STIR hyperintense and enhancing metastases within the
T6 left posterior elements, T6 right posterior vertebral
body/pedicle, T12 anterior vertebral body and T12 posterior
vertebral body are unchanged from the prior thoracic spine MRI of
[DATE] (see annotations on series 7 and series 12). A 4 mm T2
STIR hyperintense and enhancing lesion within the T7 vertebral body
appears new from the prior thoracic spine MRI of [DATE] (series
7, image 8) (series 12, image 8). This is suspicious for an
additional small metastasis. Unchanged heterogeneous enhancement at
site of a T5 vertebral body hemangioma.

Cord: No signal abnormality identified within the thoracic spinal
cord. No abnormal spinal cord enhancement.

Paraspinal and other soft tissues: Multiple known hepatic
metastases, incompletely assessed on the current exam. No paraspinal
mass.

Disc levels:

No more than mild disc space narrowing within the thoracic spine.
Multilevel shallow disc bulges, tiny disc protrusions and facet
arthrosis/ligamentum flavum hypertrophy, unchanged. No significant
spinal canal stenosis. No compressive foraminal stenosis.

Impression #2 will be called to the ordering clinician or
representative by the Radiologist Assistant, and communication
documented in the PACS or [REDACTED].
IMPRESSION: Unchanged enhancing metastases within the T6 vertebral body and
posterior elements, and T12 vertebral body.

A 4 mm enhancing lesion within the T7 vertebral body appears new
from the prior MRI of [DATE], and is suspicious for an
additional small osseous metastasis.

## 2022-02-04 MED ORDER — GADOBUTROL 1 MMOL/ML IV SOLN
9.0000 mL | Freq: Once | INTRAVENOUS | Status: AC | PRN
  Administered 2022-02-04: 9 mL via INTRAVENOUS

## 2022-02-05 ENCOUNTER — Telehealth: Payer: Self-pay

## 2022-02-05 NOTE — Telephone Encounter (Signed)
Called patient x2 and left a voicemail reminder of patiens 3:00pm telephone appointment w/ Shona Simpson PA-C. I left my extension [phone] and requested that patient return my call prior to his appointment time, so that I may complete the "nursing portion of this appointment. ?

## 2022-02-08 ENCOUNTER — Inpatient Hospital Stay: Payer: Medicare HMO | Attending: Hematology & Oncology

## 2022-02-08 ENCOUNTER — Telehealth: Payer: Self-pay

## 2022-02-08 ENCOUNTER — Ambulatory Visit
Admission: RE | Admit: 2022-02-08 | Discharge: 2022-02-08 | Disposition: A | Discharge status: Home / Self Care | Payer: Medicare HMO | Source: Ambulatory Visit | Attending: Radiation Oncology | Admitting: Radiation Oncology

## 2022-02-08 ENCOUNTER — Encounter: Payer: Self-pay | Admitting: Radiation Oncology

## 2022-02-08 DIAGNOSIS — C7B8 Other secondary neuroendocrine tumors: Secondary | ICD-10-CM

## 2022-02-08 DIAGNOSIS — C7951 Secondary malignant neoplasm of bone: Secondary | ICD-10-CM | POA: Insufficient documentation

## 2022-02-08 DIAGNOSIS — D509 Iron deficiency anemia, unspecified: Secondary | ICD-10-CM | POA: Insufficient documentation

## 2022-02-08 DIAGNOSIS — C7A1 Malignant poorly differentiated neuroendocrine tumors: Secondary | ICD-10-CM | POA: Diagnosis not present

## 2022-02-08 DIAGNOSIS — C7A8 Other malignant neuroendocrine tumors: Secondary | ICD-10-CM | POA: Insufficient documentation

## 2022-02-08 NOTE — Telephone Encounter (Signed)
Telephone appointment reminder. I left a message reminding patient of his 3:00pm-02/08/22 telephone appointment w/ Shona Simpson PA-C. I left my extension [phone] and requested that patient return my call prior to 3:00pm, so that I may complete the nursing portion of this appointment. ?

## 2022-02-08 NOTE — Telephone Encounter (Signed)
Ms. [name] from [facility] radiology called to verbally report on patient Brian Mays. She states "There is a 24m lesion in the T7 vertebral body, which appears new from prior MRI of November 2022, and is suspicious for additional small osseous metastasis."  ? ?I will deliver this report to ALucent TechnologiesPA-C. ?

## 2022-02-08 NOTE — Progress Notes (Addendum)
?Radiation Oncology         [phone] ?________________________________ ? ? Outpatient Visit - Conducted via telephone due to current COVID-19 concerns for limiting patient exposure ? ?I spoke with the patient to conduct this consult visit via telephone to spare the patient unnecessary potential exposure in the healthcare setting during the current COVID-19 pandemic. The patient was notified in advance and was offered a [place] meeting to allow for face to face communication but unfortunately reported that they did not have the appropriate resources/technology to support such a visit and instead preferred to proceed with a telephone visit. ? ? ? ?Name: Brian Mays        MRN: [id number]  ?Date of Service: 02/08/2022 DOB: [birth year] ? ?FK:CLEXNTZ, Gay Filler, MD  Copland, Gay Filler, MD    ? ?REFERRING PHYSICIAN: Copland, Gay Filler, MD ? ? ?DIAGNOSIS: The primary encounter diagnosis was Metastatic malignant neuroendocrine tumor to liver [facility]). A diagnosis of Secondary malignant neoplasm of bone [facility]) was also pertinent to this visit. ? ? ?HISTORY OF PRESENT ILLNESS: Brian Mays is a 76 y.o. male s with a history of neuroendocrine tumor in 2014, originally in the small bowel.  He has had known metastatic disease and has received Y 90 intrahepatic therapy in November 2016, Lutathera injection x4 cycles in 2019 and has remained on Somatuline under the care of Dr. Marin Olp.  PET in October 2022 showed a lesion in T6 vertebral body concerning for further neuroendocrine tumor disease stable mild uptake in the T12 vertebral body was also appreciated and hepatic metastases and central mesenteric mass were also noted but stable.  An MRI of the thoracic spine on 09/14/2021 showed enhancing lesions at T6 and T12 consistent with metastatic disease but without pathologic fracture or compromise to his spinal canal or cord.  He went on to receive stereotactic radiosurgery to T6 and T12 and single fraction style therapy which  she completed in January 2023. ? ?His first posttreatment MRI of the spine on 02/04/2022 showed posttreatment effect and T6 and T12 but a 4 mm enhancing lesion in the T7 vertebral body that was felt to be new since prior imaging.  He is also scheduled to have a PET scan on Thursday of this week and to follow-up with Dr. Marin Olp.  His case was discussed in multidisciplinary brain and spine oncology conference.  While Dr. Lisbeth Renshaw was not there for the conference discussion, Dr. Mickeal Skinner and Dr. Isidore Moos were present.  Recommendations were to potentially follow this in surveillance.  He is contacted by phone to review these recommendations. ? ? ? ?PREVIOUS RADIATION THERAPY:  ? ?  11/06/2021 through 11/06/2021 [facility] Spine ?Site Technique Total Dose (Gy) Dose per Fx (Gy) Completed Fx Beam Energies  ?Thoracic Spine: Spine_T6 IMRT 18/18 18 1/1 6XFFF  ?Thoracic Spine: Spine_T12       ? ? ?Lutathera (Lu 177) injection on 02/01/2018 - s/p cycle 4 ? ?PAST MEDICAL HISTORY:  ?Past Medical History:  ?Diagnosis Date  ? Anemia   ? in past  ? Cataract   ? Colon polyps   ? Diverticulosis   ? Gallstones   ? GERD (gastroesophageal reflux disease)   ? Heart attack ([place])   ? mild, [place] 2017  ? Heart murmur   ? Hemorrhoid   ? Hiatal hernia   ? HTN (hypertension)   ? Hyperlipidemia   ? Hypothyroidism   ? Iron deficiency anemia due to chronic blood loss 03/29/2019  ? Iron malabsorption 03/29/2019  ?  Metastatic carcinoma ([place]) 2010  ? Dr Jonette Eva  ? Neuroendocrine cancer ([place]) 08/2021  ? Pancreatitis 1998  ? chronic  ?   ? ? ?PAST SURGICAL HISTORY: ?Past Surgical History:  ?Procedure Laterality Date  ? APPENDECTOMY  1962  ? CARDIAC CATHETERIZATION N/A 02/10/2016  ? Procedure: Left Heart Cath and Coronary Angiography;  Surgeon: Adrian Prows, MD;  Location: [place] [facility];  Service: Cardiovascular;  Laterality: N/A;  ? CARDIAC CATHETERIZATION  02/10/2016  ? Procedure: Coronary/Graft Atherectomy;  Surgeon: Adrian Prows, MD;  Location: [place] [facility];   Service: Cardiovascular;;  ? CARDIAC CATHETERIZATION  02/10/2016  ? Procedure: Coronary Stent Intervention;  Surgeon: Adrian Prows, MD;  Location: [place] [facility];  Service: Cardiovascular;;  ? CATARACT EXTRACTION W/ INTRAOCULAR LENS  IMPLANT, BILATERAL Bilateral   ? CORONARY STENT INTERVENTION N/A 12/16/2021  ? Procedure: CORONARY STENT INTERVENTION;  Surgeon: Early Osmond, MD;  Location: [place] [facility];  Service: Cardiovascular;  Laterality: N/A;  ? IR GENERIC HISTORICAL  12/30/2015  ? IR RADIOLOGIST EVAL & MGMT 12/30/2015 Aletta Edouard, MD [facility] INTERV RAD  ? IR GENERIC HISTORICAL  11/09/2016  ? IR RADIOLOGIST EVAL & MGMT 11/09/2016 Aletta Edouard, MD [facility] INTERV RAD  ? IR RADIOLOGIST EVAL & MGMT  03/01/2017  ? IR RADIOLOGIST EVAL & MGMT  06/29/2017  ? LAPAROSCOPIC CHOLECYSTECTOMY  1999  ? LEFT HEART CATH AND CORONARY ANGIOGRAPHY N/A 12/16/2021  ? Procedure: LEFT HEART CATH AND CORONARY ANGIOGRAPHY;  Surgeon: Early Osmond, MD;  Location: [place] [facility];  Service: Cardiovascular;  Laterality: N/A;  ? LIVER BIOPSY  2010  ? RADIOACTIVE SEED IMPLANT  X 3  ? "to my liver"  ? TUMOR EXCISION  01/2009  ? Carcinoil Resection   ? TUMOR REMOVAL    ? from small intestine  ? ? ? ?FAMILY HISTORY:  ?Family History  ?Problem Relation Age of Onset  ? Kidney disease Mother   ? Hyperlipidemia Mother   ? Hypertension Mother   ? COPD Father   ? Ulcerative colitis Daughter   ? Colon cancer Neg Hx   ? Esophageal cancer Neg Hx   ? Rectal cancer Neg Hx   ? Stomach cancer Neg Hx   ? ? ? ?SOCIAL HISTORY:  reports that he quit smoking about 24 years ago. His smoking use included cigarettes. He started smoking about 62 years ago. He has a 52.50 pack-year smoking history. He has never used smokeless tobacco. He reports current alcohol use of about 3.0 - 4.0 standard drinks per week. He reports that he does not use drugs. The patient is married and lives in [place]. His wife is also a patient at the cancer center with Dr. Isidore Moos and  Dr. Mickeal Skinner..  ? ? ?ALLERGIES: Iohexol ? ? ?MEDICATIONS:  ?Current Outpatient Medications  ?Medication Sig Dispense Refill  ? Artificial Tear Solution (SOOTHE XP OP) Place 1 drop into both eyes daily as needed (dry eyes).    ? aspirin EC 81 MG tablet Take 81 mg by mouth every morning.    ? atorvastatin (LIPITOR) 20 MG tablet TAKE 1 TABLET BY MOUTH DAILY AT 6 PM. 90 tablet 3  ? Cholecalciferol 1000 UNITS tablet Take 1,000 Units by mouth 2 (two) times daily.    ? clopidogrel (PLAVIX) 75 MG tablet TAKE 1 TABLET BY MOUTH EVERY DAY 90 tablet 3  ? diphenhydramine-acetaminophen (TYLENOL PM) 25-500 MG TABS tablet Take 1 tablet by mouth at bedtime as needed (sleep/pain).    ?  fluticasone (FLONASE) 50 MCG/ACT nasal spray Place 2 sprays into both nostrils daily as needed (sinuses).   11  ? hydrochlorothiazide (HYDRODIURIL) 25 MG tablet Take 0.5 tablets (12.5 mg total) by mouth daily. 45 tablet 3  ? hydrocortisone (ANUSOL-HC) 25 MG suppository INSERT 1 SUPPOSITORY RECTALLY TWICE A DAY AS NEEDED FOR HEMORRHOIDS 30 suppository 0  ? ipratropium (ATROVENT) 0.03 % nasal spray Place 2 sprays into the nose 3 (three) times daily. (Patient taking differently: Place 2 sprays into the nose daily as needed for rhinitis.) 30 mL 6  ? Lanreotide Acetate (SOMATULINE DEPOT [place]) Inject 120 mcg into the skin every 28 (twenty-eight) days. Receives at Dr Antonieta Pert office    ? lansoprazole (PREVACID) 30 MG capsule Take 1 capsule (30 mg total) by mouth daily at 12 noon. 90 capsule 3  ? levothyroxine (SYNTHROID) 50 MCG tablet TAKE 1 TABLET BY MOUTH EVERY DAY BEFORE BREAKFAST 90 tablet 0  ? losartan (COZAAR) 100 MG tablet TAKE 1 TABLET BY MOUTH EVERY DAY 90 tablet 3  ? metoprolol tartrate (LOPRESSOR) 25 MG tablet Take 1 tablet (25 mg total) by mouth 2 (two) times daily. 180 tablet 3  ? nitroGLYCERIN (NITROSTAT) 0.4 MG SL tablet PLACE 1 TABLET UNDER THE TONGUE EVERY 5 (FIVE) MINUTES X 3 DOSES AS NEEDED FOR CHEST PAIN. 25 tablet 4  ? polyethylene glycol  (MIRALAX / GLYCOLAX) 17 g packet Take 17 g by mouth daily.    ? vitamin B-12 (CYANOCOBALAMIN) 1000 MCG tablet Take 1,000 mcg by mouth every other day.    ? ?No current facility-administered medications for th

## 2022-02-08 NOTE — Progress Notes (Signed)
Telephone follow-up appointment. I verified patient identity and began nursing interview. Patient is doing well overall. Denies any issues at this time. ? ?Meaningful use complete. ?Patient reminded of his 3:00pm-02/08/22 telephone appointment w/ Shona Simpson PA-C. I left my extension [phone] in case patient needs anything. Patient verbalized understanding of information. ? ?Patient contact [phone] ?

## 2022-02-08 NOTE — Addendum Note (Signed)
Encounter addended by: Hayden Pedro, PA-C on: 02/08/2022 2:55 PM ? Actions taken: Clinical Note Signed

## 2022-02-11 ENCOUNTER — Ambulatory Visit (HOSPITAL_COMMUNITY)
Admission: RE | Admit: 2022-02-11 | Discharge: 2022-02-11 | Disposition: A | Discharge status: Home / Self Care | Payer: Medicare HMO | Source: Ambulatory Visit | Attending: Hematology & Oncology | Admitting: Hematology & Oncology

## 2022-02-11 DIAGNOSIS — C786 Secondary malignant neoplasm of retroperitoneum and peritoneum: Secondary | ICD-10-CM | POA: Insufficient documentation

## 2022-02-11 DIAGNOSIS — K7689 Other specified diseases of liver: Secondary | ICD-10-CM | POA: Diagnosis not present

## 2022-02-11 DIAGNOSIS — C787 Secondary malignant neoplasm of liver and intrahepatic bile duct: Secondary | ICD-10-CM | POA: Insufficient documentation

## 2022-02-11 DIAGNOSIS — C7B8 Other secondary neuroendocrine tumors: Secondary | ICD-10-CM | POA: Insufficient documentation

## 2022-02-11 DIAGNOSIS — Z923 Personal history of irradiation: Secondary | ICD-10-CM | POA: Diagnosis not present

## 2022-02-11 DIAGNOSIS — C7A019 Malignant carcinoid tumor of the small intestine, unspecified portion: Secondary | ICD-10-CM | POA: Diagnosis not present

## 2022-02-11 DIAGNOSIS — Z8603 Personal history of neoplasm of uncertain behavior: Secondary | ICD-10-CM | POA: Diagnosis not present

## 2022-02-11 DIAGNOSIS — C7951 Secondary malignant neoplasm of bone: Secondary | ICD-10-CM | POA: Insufficient documentation

## 2022-02-11 DIAGNOSIS — C7A1 Malignant poorly differentiated neuroendocrine tumors: Secondary | ICD-10-CM | POA: Diagnosis not present

## 2022-02-11 MED ORDER — COPPER CU 64 DOTATATE 1 MCI/ML IV SOLN
4.0000 | Freq: Once | INTRAVENOUS | Status: AC
  Administered 2022-02-11: 4.25 via INTRAVENOUS

## 2022-02-12 ENCOUNTER — Telehealth: Payer: Self-pay | Admitting: *Deleted

## 2022-02-12 NOTE — Telephone Encounter (Signed)
-----   Message from Volanda Napoleon, MD sent at 02/12/2022  1:31 PM EDT ----- ?Please call and let him know that the PET scan looks very stable.  There is no growth. ? ?Laurey Arrow ?

## 2022-02-12 NOTE — Telephone Encounter (Signed)
Pt notified per order of Dr. Marin Olp "that the PET scan looks very stable. There is no growth.  Brian Mays"  Pt is appreciative of call and has no questions at this time.  ?

## 2022-02-15 ENCOUNTER — Inpatient Hospital Stay: Payer: Medicare HMO

## 2022-02-15 ENCOUNTER — Other Ambulatory Visit: Payer: Self-pay | Admitting: *Deleted

## 2022-02-15 ENCOUNTER — Other Ambulatory Visit: Payer: Self-pay

## 2022-02-15 ENCOUNTER — Encounter: Payer: Self-pay | Admitting: *Deleted

## 2022-02-15 ENCOUNTER — Inpatient Hospital Stay (HOSPITAL_BASED_OUTPATIENT_CLINIC_OR_DEPARTMENT_OTHER): Payer: Medicare HMO | Admitting: Hematology & Oncology

## 2022-02-15 ENCOUNTER — Encounter: Payer: Self-pay | Admitting: Hematology & Oncology

## 2022-02-15 VITALS — BP 143/53 | HR 64 | Temp 98.2°F | Resp 18 | Wt 188.0 lb

## 2022-02-15 DIAGNOSIS — I2510 Atherosclerotic heart disease of native coronary artery without angina pectoris: Secondary | ICD-10-CM

## 2022-02-15 DIAGNOSIS — D500 Iron deficiency anemia secondary to blood loss (chronic): Secondary | ICD-10-CM

## 2022-02-15 DIAGNOSIS — C7B8 Other secondary neuroendocrine tumors: Secondary | ICD-10-CM

## 2022-02-15 DIAGNOSIS — C7A8 Other malignant neuroendocrine tumors: Secondary | ICD-10-CM | POA: Diagnosis not present

## 2022-02-15 DIAGNOSIS — D509 Iron deficiency anemia, unspecified: Secondary | ICD-10-CM | POA: Diagnosis not present

## 2022-02-15 DIAGNOSIS — R011 Cardiac murmur, unspecified: Secondary | ICD-10-CM

## 2022-02-15 LAB — CMP (CANCER CENTER ONLY)
ALT: 12 U/L (ref 0–44)
AST: 20 U/L (ref 15–41)
Albumin: 4.1 g/dL (ref 3.5–5.0)
Alkaline Phosphatase: 52 U/L (ref 38–126)
Anion gap: 7 (ref 5–15)
BUN: 16 mg/dL (ref 8–23)
CO2: 31 mmol/L (ref 22–32)
Calcium: 9.7 mg/dL (ref 8.9–10.3)
Chloride: 99 mmol/L (ref 98–111)
Creatinine: 1.07 mg/dL (ref 0.61–1.24)
GFR, Estimated: >60 mL/min (ref >60)
Glucose, Bld: 120 mg/dL — ABNORMAL HIGH (ref 70–99)
Potassium: 4.2 mmol/L (ref 3.5–5.1)
Sodium: 137 mmol/L (ref 135–145)
Total Bilirubin: 0.9 mg/dL (ref 0.3–1.2)
Total Protein: 7.5 g/dL (ref 6.5–8.1)

## 2022-02-15 LAB — CBC WITH DIFFERENTIAL (CANCER CENTER ONLY)
Abs Immature Granulocytes: 0.02 10*3/uL (ref 0.00–0.07)
Basophils Absolute: 0 10*3/uL (ref 0.0–0.1)
Basophils Relative: 1 %
Eosinophils Absolute: 0.4 10*3/uL (ref 0.0–0.5)
Eosinophils Relative: 7 %
HCT: 36.4 % — ABNORMAL LOW (ref 39.0–52.0)
Hemoglobin: 12 g/dL — ABNORMAL LOW (ref 13.0–17.0)
Immature Granulocytes: 0 %
Lymphocytes Relative: 13 %
Lymphs Abs: 0.7 10*3/uL (ref 0.7–4.0)
MCH: 30.6 pg (ref 26.0–34.0)
MCHC: 33 g/dL (ref 30.0–36.0)
MCV: 92.9 fL (ref 80.0–100.0)
Monocytes Absolute: 0.4 10*3/uL (ref 0.1–1.0)
Monocytes Relative: 7 %
Neutro Abs: 3.9 10*3/uL (ref 1.7–7.7)
Neutrophils Relative %: 72 %
Platelet Count: 157 10*3/uL (ref 150–400)
RBC: 3.92 MIL/uL — ABNORMAL LOW (ref 4.22–5.81)
RDW: 14.5 % (ref 11.5–15.5)
WBC Count: 5.3 10*3/uL (ref 4.0–10.5)
nRBC: 0 % (ref 0.0–0.2)

## 2022-02-15 LAB — IRON AND IRON BINDING CAPACITY (CC-WL,HP ONLY)
Iron: 75 ug/dL (ref 45–182)
Saturation Ratios: 24 % (ref 17.9–39.5)
TIBC: 315 ug/dL (ref 250–450)
UIBC: 240 ug/dL (ref 117–376)

## 2022-02-15 LAB — LACTATE DEHYDROGENASE: LDH: 176 U/L (ref 98–192)

## 2022-02-15 LAB — FERRITIN: Ferritin: 256 ng/mL (ref 24–336)

## 2022-02-15 MED ORDER — LANREOTIDE ACETATE 120 MG/0.5ML ~~LOC~~ SOLN
120.0000 mg | Freq: Once | SUBCUTANEOUS | Status: AC
  Administered 2022-02-15: 120 mg via SUBCUTANEOUS
  Filled 2022-02-15: qty 120

## 2022-02-15 NOTE — Patient Instructions (Signed)
Lanreotide injection What is this medication? LANREOTIDE (lan REE oh tide) is used to reduce blood levels of growth hormone in patients with a condition called acromegaly. It also works to slow or stop tumor growth in patients with neuroendocrine tumors and treat carcinoid syndrome. This medicine may be used for other purposes; ask your health care provider or pharmacist if you have questions. COMMON BRAND NAME(S): Somatuline [place] What should I tell my care team before I take this medication? They need to know if you have any of these conditions: diabetes gallbladder disease heart disease kidney disease liver disease thyroid disease an unusual or allergic reaction to lanreotide, other medicines, foods, dyes, or preservatives pregnant or trying to get pregnant breast-feeding How should I use this medication? This medicine is for injection under the skin. It is given by a health care professional in a hospital or clinic setting. Contact your pediatrician or health care professional regarding the use of this medicine in children. Special care may be needed. Overdosage: If you think you have taken too much of this medicine contact a poison control center or emergency room at once. NOTE: This medicine is only for you. Do not share this medicine with others. What if I miss a dose? It is important not to miss your dose. Call your doctor or health care professional if you are unable to keep an appointment. What may interact with this medication? This medicine may interact with the following medications: bromocriptine cyclosporine certain medicines for blood pressure, heart disease, irregular heart beat certain medicines for diabetes quinidine terfenadine This list may not describe all possible interactions. Give your health care provider a list of all the medicines, herbs, non-prescription drugs, or dietary supplements you use. Also tell them if you smoke, drink alcohol, or use illegal drugs.  Some items may interact with your medicine. What should I watch for while using this medication? Tell your doctor or healthcare professional if your symptoms do not start to get better or if they get worse. Visit your doctor or health care professional for regular checks on your progress. Your condition will be monitored carefully while you are receiving this medicine. This medicine may increase blood sugar. Ask your healthcare provider if changes in diet or medicines are needed if you have diabetes. You may need blood work done while you are taking this medicine. Women should inform their doctor if they wish to become pregnant or think they might be pregnant. There is a potential for serious side effects to an unborn child. Talk to your health care professional or pharmacist for more information. Do not breast-feed an infant while taking this medicine or for 6 months after stopping it. This medicine has caused ovarian failure in some women. This medicine may interfere with the ability to have a child. Talk with your doctor or health care professional if you are concerned about your fertility. What side effects may I notice from receiving this medication? Side effects that you should report to your doctor or health care professional as soon as possible: allergic reactions like skin rash, itching or hives, swelling of the face, lips, or tongue increased blood pressure severe stomach pain signs and symptoms of hgh blood sugar such as being more thirsty or hungry or having to urinate more than normal. You may also feel very tired or have blurry vision. signs and symptoms of low blood sugar such as feeling anxious; confusion; dizziness; increased hunger; unusually weak or tired; sweating; shakiness; cold; irritable; headache; blurred vision; fast   heartbeat; loss of consciousness unusually slow heartbeat Side effects that usually do not require medical attention (report to your doctor or health care  professional if they continue or are bothersome): constipation diarrhea dizziness headache muscle pain muscle spasms nausea pain, redness, or irritation at site where injected This list may not describe all possible side effects. Call your doctor for medical advice about side effects. You may report side effects to FDA at 1-800-FDA-1088. Where should I keep my medication? This drug is given in a hospital or clinic and will not be stored at home. NOTE: This sheet is a summary. It may not cover all possible information. If you have questions about this medicine, talk to your doctor, pharmacist, or health care provider.  2023 Elsevier/Gold Standard (2018-09-06 00:00:00)  
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
## 2022-02-17 ENCOUNTER — Telehealth: Payer: Self-pay | Admitting: Radiation Oncology

## 2022-02-17 LAB — CHROMOGRANIN A: Chromogranin A (ng/mL): 151.7 ng/mL — ABNORMAL HIGH (ref 0.0–101.8)

## 2022-02-17 NOTE — Telephone Encounter (Signed)
I spoke with the patient and let him know Dr. Lisbeth Renshaw does not recommend radiation to the T7 finding from his MRI On 02/04/22 since his PET on 02/11/22 did not show a discrete area of uptake in that location. We will plan for another MRI of his T spine in 3 months, and also do annual spine scans later this year.  ?

## 2022-02-22 ENCOUNTER — Ambulatory Visit (INDEPENDENT_AMBULATORY_CARE_PROVIDER_SITE_OTHER): Payer: Medicare HMO

## 2022-02-22 VITALS — Ht 70.5 in | Wt 185.0 lb

## 2022-02-22 DIAGNOSIS — Z0000 Encounter for general adult medical examination without abnormal findings: Secondary | ICD-10-CM | POA: Diagnosis not present

## 2022-02-22 NOTE — Progress Notes (Signed)
? ?Subjective:  ? Brian Mays is a 76 y.o. male who presents for Medicare Annual/Subsequent preventive examination. ? ?I connected with Lindy ? today by telephone and verified that I am speaking with the correct person using two identifiers. ?Location patient: home ?Location provider: work ?Persons participating in the virtual visit: patient, nurse.  ?  ?I discussed the limitations, risks, security and privacy concerns of performing an evaluation and management service by telephone and the availability of in person appointments. I also discussed with the patient that there may be a patient responsible charge related to this service. The patient expressed understanding and verbally consented to this telephonic visit.  ?  ?Interactive audio and video telecommunications were attempted between this provider and patient, however failed, due to patient having technical difficulties OR patient did not have access to video capability.  We continued and completed visit with audio only. ? ?Some vital signs may be absent or patient reported.  ? ?Time Spent with patient on telephone encounter: 20 minutes ? ? ?Review of Systems    ? ?Cardiac Risk Factors include: advanced age (>32mn, >>34women);male gender;dyslipidemia;hypertension ? ?   ?Objective:  ?  ?Today's Vitals  ? 02/22/22 0931  ?Weight: 185 lb (83.9 kg)  ?Height: 5' 10.5" (1.791 m)  ? ?Body mass index is 26.17 kg/m?. ? ? ?  02/22/2022  ?  9:35 AM 02/15/2022  ?  9:28 AM 02/08/2022  ?  8:41 AM 01/01/2022  ?  9:09 AM 12/16/2021  ? 11:37 AM 12/01/2021  ?  9:22 AM 11/13/2021  ?  1:33 AM  ?Advanced Directives  ?Does Patient Have a Medical Advance Directive? Yes Yes Yes Yes Yes Yes Yes  ?Type of [place]of [place]ingham LakeLiving will Living will;Healthcare Power of Attorney  Living will;Healthcare Power of [place]boroughLiving will [place]letonLiving will [place]ievilleLiving will  ?Does patient want to  make changes to medical advance directive?  No - Patient declined  No - Patient declined No - Patient declined    ?Copy of [place] Chart? No - copy requested     No - copy requested No - copy requested  ?Would patient like information on creating a medical advance directive?     No - Patient declined No - Patient declined   ? ? ?Current Medications (verified) ?Outpatient Encounter Medications as of 02/22/2022  ?Medication Sig  ? Artificial Tear Solution (SOOTHE XP OP) Place 1 drop into both eyes daily as needed (dry eyes).  ? aspirin EC 81 MG tablet Take 81 mg by mouth every morning.  ? atorvastatin (LIPITOR) 80 MG tablet Take 80 mg by mouth daily.  ? Cholecalciferol 1000 UNITS tablet Take 1,000 Units by mouth 2 (two) times daily.  ? clopidogrel (PLAVIX) 75 MG tablet TAKE 1 TABLET BY MOUTH EVERY DAY  ? diphenhydramine-acetaminophen (TYLENOL PM) 25-500 MG TABS tablet Take 1 tablet by mouth at bedtime as needed (sleep/pain).  ? fluticasone (FLONASE) 50 MCG/ACT nasal spray Place 2 sprays into both nostrils daily as needed (sinuses).   ? hydrochlorothiazide (HYDRODIURIL) 25 MG tablet Take 0.5 tablets (12.5 mg total) by mouth daily.  ? hydrocortisone (ANUSOL-HC) 25 MG suppository INSERT 1 SUPPOSITORY RECTALLY TWICE A DAY AS NEEDED FOR HEMORRHOIDS  ? ipratropium (ATROVENT) 0.03 % nasal spray Place 2 sprays into the nose 3 (three) times daily. (Patient taking differently: Place 2 sprays into the nose daily as needed for rhinitis.)  ? Lanreotide  Acetate (SOMATULINE DEPOT [place]) Inject 120 mcg into the skin every 28 (twenty-eight) days. Receives at Dr Antonieta Pert office  ? lansoprazole (PREVACID) 30 MG capsule Take 1 capsule (30 mg total) by mouth daily at 12 noon.  ? levothyroxine (SYNTHROID) 50 MCG tablet TAKE 1 TABLET BY MOUTH EVERY DAY BEFORE BREAKFAST  ? losartan (COZAAR) 100 MG tablet TAKE 1 TABLET BY MOUTH EVERY DAY  ? metoprolol tartrate (LOPRESSOR) 25 MG tablet Take 1 tablet (25 mg total) by mouth  2 (two) times daily.  ? nitroGLYCERIN (NITROSTAT) 0.4 MG SL tablet PLACE 1 TABLET UNDER THE TONGUE EVERY 5 (FIVE) MINUTES X 3 DOSES AS NEEDED FOR CHEST PAIN.  ? polyethylene glycol (MIRALAX / GLYCOLAX) 17 g packet Take 17 g by mouth daily.  ? vitamin B-12 (CYANOCOBALAMIN) 1000 MCG tablet Take 1,000 mcg by mouth every other day.  ? ?No facility-administered encounter medications on file as of 02/22/2022.  ? ? ?Allergies (verified) ?Iohexol and Iodinated contrast media  ? ?History: ?Past Medical History:  ?Diagnosis Date  ? Anemia   ? in past  ? Cataract   ? Colon polyps   ? Diverticulosis   ? Gallstones   ? GERD (gastroesophageal reflux disease)   ? Heart attack ([place])   ? mild, Spring 2017  ? Heart murmur   ? Hemorrhoid   ? Hiatal hernia   ? HTN (hypertension)   ? Hyperlipidemia   ? Hypothyroidism   ? Iron deficiency anemia due to chronic blood loss 03/29/2019  ? Iron malabsorption 03/29/2019  ? Metastatic carcinoma ([place]) 2010  ? Dr Jonette Eva  ? Neuroendocrine cancer ([place]) 08/2021  ? Pancreatitis 1998  ? chronic  ? ?Past Surgical History:  ?Procedure Laterality Date  ? APPENDECTOMY  1962  ? CARDIAC CATHETERIZATION N/A 02/10/2016  ? Procedure: Left Heart Cath and Coronary Angiography;  Surgeon: Adrian Prows, MD;  Location: [facility];  Service: Cardiovascular;  Laterality: N/A;  ? CARDIAC CATHETERIZATION  02/10/2016  ? Procedure: Coronary/Graft Atherectomy;  Surgeon: Adrian Prows, MD;  Location: [facility];  Service: Cardiovascular;;  ? CARDIAC CATHETERIZATION  02/10/2016  ? Procedure: Coronary Stent Intervention;  Surgeon: Adrian Prows, MD;  Location: [facility];  Service: Cardiovascular;;  ? CATARACT EXTRACTION W/ INTRAOCULAR LENS  IMPLANT, BILATERAL Bilateral   ? CORONARY STENT INTERVENTION N/A 12/16/2021  ? Procedure: CORONARY STENT INTERVENTION;  Surgeon: Early Osmond, MD;  Location: [facility];  Service: Cardiovascular;  Laterality: N/A;  ? IR GENERIC HISTORICAL  12/30/2015  ? IR RADIOLOGIST  EVAL & MGMT 12/30/2015 Aletta Edouard, MD [facility] INTERV RAD  ? IR GENERIC HISTORICAL  11/09/2016  ? IR RADIOLOGIST EVAL & MGMT 11/09/2016 Aletta Edouard, MD [facility] INTERV RAD  ? IR RADIOLOGIST EVAL & MGMT  03/01/2017  ? IR RADIOLOGIST EVAL & MGMT  06/29/2017  ? LAPAROSCOPIC CHOLECYSTECTOMY  1999  ? LEFT HEART CATH AND CORONARY ANGIOGRAPHY N/A 12/16/2021  ? Procedure: LEFT HEART CATH AND CORONARY ANGIOGRAPHY;  Surgeon: Early Osmond, MD;  Location: [facility];  Service: Cardiovascular;  Laterality: N/A;  ? LIVER BIOPSY  2010  ? RADIOACTIVE SEED IMPLANT  X 3  ? "to my liver"  ? TUMOR EXCISION  01/2009  ? Carcinoil Resection   ? TUMOR REMOVAL    ? from small intestine  ? ?Family History  ?Problem Relation Age of Onset  ? Kidney disease Mother   ? Hyperlipidemia Mother   ? Hypertension Mother   ? COPD  Father   ? Ulcerative colitis Daughter   ? Colon cancer Neg Hx   ? Esophageal cancer Neg Hx   ? Rectal cancer Neg Hx   ? Stomach cancer Neg Hx   ? ?Social History  ? ?Socioeconomic History  ? Marital status: Married  ?  Spouse name: Not on file  ? Number of children: 1  ? Years of education: Not on file  ? Highest education level: Not on file  ?Occupational History  ? Occupation: Sales  ?  Employer: [employer]  ?Tobacco Use  ? Smoking status: Former  ?  Packs/day: 1.50  ?  Years: 35.00  ?  Pack years: 52.50  ?  Types: Cigarettes  ?  Start date: 09/25/1959  ?  Quit date: 12/21/1997  ?  Years since quitting: 24.1  ? Smokeless tobacco: Never  ?Vaping Use  ? Vaping Use: Never used  ?Substance and Sexual Activity  ? Alcohol use: Yes  ?  Alcohol/week: 3.0 - 4.0 standard drinks  ?  Types: 3 - 4 Cans of beer per week  ?  Comment: 4 beers weekly   ? Drug use: No  ? Sexual activity: Yes  ?Other Topics Concern  ? Not on file  ?Social History Narrative  ? Regular Exercise -  NO  ? ?Social Determinants of Health  ? ?Financial Resource Strain: Low Risk   ? Difficulty of Paying Living Expenses: Not hard at all  ?Food  Insecurity: No Food Insecurity  ? Worried About Charity fundraiser in the Last Year: Never true  ? Ran Out of Food in the Last Year: Never true  ?Transportation Needs: No Transportation Needs  ? Lack of Trans

## 2022-02-22 NOTE — Patient Instructions (Signed)
Mr. Schranz , ?Thank you for taking time to complete your Medicare Wellness Visit. I appreciate your ongoing commitment to your health goals. Please review the following plan we discussed and let me know if I can assist you in the future.  ? ?Screening recommendations/referrals: ?Colonoscopy: Per our conversation, you will call GI to inquire about when to repeat colonoscopy. ?Recommended yearly ophthalmology/optometry visit for glaucoma screening and checkup ?Recommended yearly dental visit for hygiene and checkup ? ?Vaccinations: ?Influenza vaccine: Up to date ?Pneumococcal vaccine: Up to date ?Tdap vaccine: Up to date ?Shingles vaccine: Completed vaccines   ?Covid-19: Declined booster ? ?Advanced directives: Please bring a copy of Living Will and/or Healthcare Power of Attorney for your chart. ? ? ?Conditions/risks identified: Se problem list ? ?Next appointment: Follow up in one year for your annual wellness visit.  ? ?Preventive Care 9 Years and Older, Male ?Preventive care refers to lifestyle choices and visits with your health care provider that can promote health and wellness. ?What does preventive care include? ?A yearly physical exam. This is also called an annual well check. ?Dental exams once or twice a year. ?Routine eye exams. Ask your health care provider how often you should have your eyes checked. ?Personal lifestyle choices, including: ?Daily care of your teeth and gums. ?Regular physical activity. ?Eating a healthy diet. ?Avoiding tobacco and drug use. ?Limiting alcohol use. ?Practicing safe sex. ?Taking low doses of aspirin every day. ?Taking vitamin and mineral supplements as recommended by your health care provider. ?What happens during an annual well check? ?The services and screenings done by your health care provider during your annual well check will depend on your age, overall health, lifestyle risk factors, and family history of disease. ?Counseling  ?Your health care provider may ask you  questions about your: ?Alcohol use. ?Tobacco use. ?Drug use. ?Emotional well-being. ?Home and relationship well-being. ?Sexual activity. ?Eating habits. ?History of falls. ?Memory and ability to understand (cognition). ?Work and work Statistician. ?Screening  ?You may have the following tests or measurements: ?Height, weight, and BMI. ?Blood pressure. ?Lipid and cholesterol levels. These may be checked every 5 years, or more frequently if you are over 64 years old. ?Skin check. ?Lung cancer screening. You may have this screening every year starting at age 60 if you have a 30-pack-year history of smoking and currently smoke or have quit within the past 15 years. ?Fecal occult blood test (FOBT) of the stool. You may have this test every year starting at age 5. ?Flexible sigmoidoscopy or colonoscopy. You may have a sigmoidoscopy every 5 years or a colonoscopy every 10 years starting at age 75. ?Prostate cancer screening. Recommendations will vary depending on your family history and other risks. ?Hepatitis C blood test. ?Hepatitis B blood test. ?Sexually transmitted disease (STD) testing. ?Diabetes screening. This is done by checking your blood sugar (glucose) after you have not eaten for a while (fasting). You may have this done every 1-3 years. ?Abdominal aortic aneurysm (AAA) screening. You may need this if you are a current or former smoker. ?Osteoporosis. You may be screened starting at age 49 if you are at high risk. ?Talk with your health care provider about your test results, treatment options, and if necessary, the need for more tests. ?Vaccines  ?Your health care provider may recommend certain vaccines, such as: ?Influenza vaccine. This is recommended every year. ?Tetanus, diphtheria, and acellular pertussis (Tdap, Td) vaccine. You may need a Td booster every 10 years. ?Zoster vaccine. You may need this after  age 66. ?Pneumococcal 13-valent conjugate (PCV13) vaccine. One dose is recommended after age  73. ?Pneumococcal polysaccharide (PPSV23) vaccine. One dose is recommended after age 79. ?Talk to your health care provider about which screenings and vaccines you need and how often you need them. ?This information is not intended to replace advice given to you by your health care provider. Make sure you discuss any questions you have with your health care provider. ?Document Released: 11/14/2015 Document Revised: 07/07/2016 Document Reviewed: 08/19/2015 ?Elsevier Interactive Patient Education ? 2017 [place]. ? ?Fall Prevention in the Home ?Falls can cause injuries. They can happen to people of all ages. There are many things you can do to make your home safe and to help prevent falls. ?What can I do on the outside of my home? ?Regularly fix the edges of walkways and driveways and fix any cracks. ?Remove anything that might make you trip as you walk through a door, such as a raised step or threshold. ?Trim any bushes or trees on the path to your home. ?Use bright outdoor lighting. ?Clear any walking paths of anything that might make someone trip, such as rocks or tools. ?Regularly check to see if handrails are loose or broken. Make sure that both sides of any steps have handrails. ?Any raised decks and porches should have guardrails on the edges. ?Have any leaves, snow, or ice cleared regularly. ?Use sand or salt on walking paths during winter. ?Clean up any spills in your garage right away. This includes oil or grease spills. ?What can I do in the bathroom? ?Use night lights. ?Install grab bars by the toilet and in the tub and shower. Do not use towel bars as grab bars. ?Use non-skid mats or decals in the tub or shower. ?If you need to sit down in the shower, use a plastic, non-slip stool. ?Keep the floor dry. Clean up any water that spills on the floor as soon as it happens. ?Remove soap buildup in the tub or shower regularly. ?Attach bath mats securely with double-sided non-slip rug tape. ?Do not have throw  rugs and other things on the floor that can make you trip. ?What can I do in the bedroom? ?Use night lights. ?Make sure that you have a light by your bed that is easy to reach. ?Do not use any sheets or blankets that are too big for your bed. They should not hang down onto the floor. ?Have a firm chair that has side arms. You can use this for support while you get dressed. ?Do not have throw rugs and other things on the floor that can make you trip. ?What can I do in the kitchen? ?Clean up any spills right away. ?Avoid walking on wet floors. ?Keep items that you use a lot in easy-to-reach places. ?If you need to reach something above you, use a strong step stool that has a grab bar. ?Keep electrical cords out of the way. ?Do not use floor polish or wax that makes floors slippery. If you must use wax, use non-skid floor wax. ?Do not have throw rugs and other things on the floor that can make you trip. ?What can I do with my stairs? ?Do not leave any items on the stairs. ?Make sure that there are handrails on both sides of the stairs and use them. Fix handrails that are broken or loose. Make sure that handrails are as long as the stairways. ?Check any carpeting to make sure that it is firmly attached to the stairs.  Fix any carpet that is loose or worn. ?Avoid having throw rugs at the top or bottom of the stairs. If you do have throw rugs, attach them to the floor with carpet tape. ?Make sure that you have a light switch at the top of the stairs and the bottom of the stairs. If you do not have them, ask someone to add them for you. ?What else can I do to help prevent falls? ?Wear shoes that: ?Do not have high heels. ?Have rubber bottoms. ?Are comfortable and fit you well. ?Are closed at the toe. Do not wear sandals. ?If you use a stepladder: ?Make sure that it is fully opened. Do not climb a closed stepladder. ?Make sure that both sides of the stepladder are locked into place. ?Ask someone to hold it for you, if  possible. ?Clearly mark and make sure that you can see: ?Any grab bars or handrails. ?First and last steps. ?Where the edge of each step is. ?Use tools that help you move around (mobility aids) if they are needed. T

## 2022-02-23 ENCOUNTER — Encounter: Payer: Self-pay | Admitting: Internal Medicine

## 2022-02-24 ENCOUNTER — Other Ambulatory Visit: Payer: Self-pay | Admitting: Family Medicine

## 2022-02-24 DIAGNOSIS — E039 Hypothyroidism, unspecified: Secondary | ICD-10-CM

## 2022-02-25 ENCOUNTER — Encounter: Payer: Self-pay | Admitting: Cardiovascular Disease

## 2022-02-25 NOTE — Progress Notes (Signed)
? ?Patient ID: Brian Mays; [id number]; [birth year]  ? ?Admit date: (Not on file) ?Date of Consult: 02/26/2022 ? ?Primary Care Provider: Darreld Mclean, MD ?Primary Cardiologist:  Saharah Sherrow  ?Primary Electrophysiologist:   ? ? ?Problem list ?1.  Coronary artery disease: Status post rotational atherectomy and stenting of the RCA ?2.  Hypertension ?3.  Hyperlipidemia ?4.  Hypothyroidism ? ?Patient Profile:  ? ?Brian Mays is a 76 y.o. male with a hx of HTN, , coronary artery disease with a recent episode of weakness and near syncope. ? who is being seen today for the evaluation of HTN  at the request of Dr. Marin Olp. ?He sees Dr. Marin Olp for Carcinoid ? ? ?   ? ?Brian Mays is seen wife wife , Brian Mays today  ? ?Hx of coronary stenting in March 2017 .   Was started on Metoprolol, he has had intermittent weakness and dizziness/lightheadedness since that time. ? ?He eats and drinks regularly.   Generally gets some regular exercise.  Has had some back issues recently.  He still goes to the line does some exercises that do not cause further back pain. ? ?Dr. Marin Olp asked him to reduce his BP meds.   The dizziness has improved but now his BP is too high ? 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  [facility] . ?Does have some lightheadedness after working out .  ?No CP or dyspnea while working out.   ?No synmptoms similar to his MI.   ? ?He stopped the Losartan last week ?He decreased the metoprolol to 12. 5 mg BID last week.  ? ?Still eats salty foods. Wife cooks from Insurance account manager .  Eats some pretzels.  ? ?Has flushing related to Carcinoid.    Typically occurs if he drinks some beer .  ? ?Feb.  12, 2019: ?Doing well ?Gaining some weight .   ?Gave him the OK to walk  ?No CP or dyspnea  ? ?Aug. 27 ,2019: ? ?Doing well.   ?Feeling better since he decreased his metoprolol in half.  ? ?September 15, 2018: ?Seen back today for follow-up of his coronary artery disease, hypertension,  hyperlipidemia. ?He increase his losartan 3 weeks ago. ?No CP  ?BP readings at home have been elevated.  ?Is not exercising  ? ?August 06, 2019: ? Brian Mays is seen back today for follow-up of his coronary artery disease, hypertension, hyperlipidemia. ?BP has been well controlled at home .  ?No CP , is active , not doing exercise per se ?Tries to avoid salty foods.  ? ?April 22, 2020: ? ?Brian Mays is seen today for follow up for his CAD, HTN, and hyperlipidemia. ?Several weeks ago he developed some light headedness.  ?Was helping a customer unload paint.  ?Was bending over - up and down repeatedly  ?Symptoms ~ c/w orthostasis  ? ?Went to the ER  ?Denies any chest pain .   ?Associated with some fatigue.  ?Had not skipped any meals,  May have been a bit volume depleted. ( had walked 5 miles the day before )  ?Labs looked ok .   Troponin were negative x 2.  ?ECG was normal  ? ?Dec. 3, 2021: ?Brian Mays is seen back today for follow up of his CAD, HTN, HLD ?Had some orthostasis prior to his previous visit  ?We reduced  his HCTZ at his last visit  ?Still eats salty foods regularly  ?Is not exercising  regularly .  ? ?Dec. 2, 2022 ?Brian Mays is seen back for follow up of his CAD, HTN, HLD ?No cp ?Is not as active as he used to be ?Is having to take care of his wife who had brain cancer surgery  ?She will need radiation  ?Has had his 2 covid vaccines and 2 boosters.  ?Has not had had covid  ?Wants to get back out on his mountain bike - ive encouraged him to ride / exercise regularly  ? ?Feb. 27, 2023 ?Brian Mays is here to follow up with CAD, HTN, HLD  ?He here with some anginal-like symptoms in December.  Heart catheterization revealed a mid RCA stenosis of 99%.  This lesion was stented.  He has mild LAD disease. ?His belching has resolved.  ? ?LDL was 44 in Nov. 2023  ?Had been started on Imdur prior to his procedure.  OK to stop imdur ? ?February 26, 2022: ? ?Had stenting several months ago  ?He had acute dizziness and some belching  (which is his anginal equivalent).  While driving.  He was admitted to a [facility] hospital.  Heart catheterization revealed a tight in-stent restenosis.  He had repeat PCI of an existing stent. ? ? ?Conclusion: ?1. Successful intervention with intravascular ultrasound to the proximal and mid right coronary artery using balloon angioplasty with 3.0 x 12, 3.5 x 20 and 4.0 x 12 mm [place] Quantum balloons. Preintervention stenosis 90%, postintervention stenosis 20%. ? ?Recommendations: ?Dual anti-platelet therapy. ?High intensity statin therapy. ?Continue risk factor modifications. ?Add Ranexa, if he continues to have symptoms can consider shockwave therapy to the mid RCA ? ? ?He still had some episodes of lightheadedness.  He reduced his metoprolol to one half and that seems to have helped with his lightheadedness.  He is having to do all the driving and his family since his wife has a brain tumor. ? ?Past Medical History:  ?Diagnosis Date  ? Anemia   ? in past  ? Cataract   ? Colon polyps   ? Diverticulosis   ? Gallstones   ? GERD (gastroesophageal reflux disease)   ? Heart attack ([place])   ? mild, Spring 2017  ? Heart murmur   ? Hemorrhoid   ? Hiatal hernia   ? HTN (hypertension)   ? Hyperlipidemia   ? Hypothyroidism   ? Iron deficiency anemia due to chronic blood loss 03/29/2019  ? Iron malabsorption 03/29/2019  ? Metastatic carcinoma ([place]) 2010  ? Dr Jonette Eva  ? Neuroendocrine cancer ([place]) 08/2021  ? Pancreatitis 1998  ? chronic  ? ? ?Past Surgical History:  ?Procedure Laterality Date  ? APPENDECTOMY  1962  ? CARDIAC CATHETERIZATION N/A 02/10/2016  ? Procedure: Left Heart Cath and Coronary Angiography;  Surgeon: Adrian Prows, MD;  Location: [facility];  Service: Cardiovascular;  Laterality: N/A;  ? CARDIAC CATHETERIZATION  02/10/2016  ? Procedure: Coronary/Graft Atherectomy;  Surgeon: Adrian Prows, MD;  Location: [facility];  Service: Cardiovascular;;  ? CARDIAC CATHETERIZATION  02/10/2016  ? Procedure: Coronary Stent  Intervention;  Surgeon: Adrian Prows, MD;  Location: [facility];  Service: Cardiovascular;;  ? CATARACT EXTRACTION W/ INTRAOCULAR LENS  IMPLANT, BILATERAL Bilateral   ? CORONARY STENT INTERVENTION N/A 12/16/2021  ? Procedure: CORONARY STENT INTERVENTION;  Surgeon: Early Osmond, MD;  Location: [facility];  Service: Cardiovascular;  Laterality: N/A;  ? IR GENERIC HISTORICAL  12/30/2015  ? IR RADIOLOGIST EVAL & MGMT 12/30/2015 Aletta Edouard, MD [facility] INTERV  RAD  ? IR GENERIC HISTORICAL  11/09/2016  ? IR RADIOLOGIST EVAL & MGMT 11/09/2016 Aletta Edouard, MD [facility] INTERV RAD  ? IR RADIOLOGIST EVAL & MGMT  03/01/2017  ? IR RADIOLOGIST EVAL & MGMT  06/29/2017  ? LAPAROSCOPIC CHOLECYSTECTOMY  1999  ? LEFT HEART CATH AND CORONARY ANGIOGRAPHY N/A 12/16/2021  ? Procedure: LEFT HEART CATH AND CORONARY ANGIOGRAPHY;  Surgeon: Early Osmond, MD;  Location: [facility];  Service: Cardiovascular;  Laterality: N/A;  ? LIVER BIOPSY  2010  ? RADIOACTIVE SEED IMPLANT  X 3  ? "to my liver"  ? TUMOR EXCISION  01/2009  ? Carcinoil Resection   ? TUMOR REMOVAL    ? from small intestine  ?  ? ?Home Medications:  ?Prior to Admission medications   ?Medication Sig Start Date End Date Taking? Authorizing Provider  ?amLODipine (NORVASC) 5 MG tablet Take 5 mg by mouth daily. 03/16/17  Yes [provider]  ?aspirin EC 81 MG tablet Take 81 mg by mouth every morning.   Yes [provider]  ?atorvastatin (LIPITOR) 20 MG tablet Take 20 mg daily by mouth.   Yes [provider]  ?Cholecalciferol 1000 UNITS tablet Take 1,000 Units by mouth daily.     Yes [provider]  ?fluticasone (FLONASE) 50 MCG/ACT nasal spray Place 2 sprays into both nostrils daily as needed (sinuses).  01/06/15  Yes [provider]  ?hydrocortisone (ANUSOL-HC) 25 MG suppository INSERT 1 SUPPOSITORY RECTALLY TWICE A DAY AS NEEDED FOR HEMORRHOIDS 02/07/15  Yes [place], Holli Humbles, NP  ?Lanreotide Acetate (SOMATULINE DEPOT [place])  Inject 120 mcg into the skin every 28 (twenty-eight) days. Receives at Dr Antonieta Pert office   Yes [provider]  ?lansoprazole (PREVACID) 30 MG capsule Take 1 capsule (30 mg total) by mouth da

## 2022-02-26 ENCOUNTER — Encounter: Payer: Self-pay | Admitting: Cardiovascular Disease

## 2022-02-26 ENCOUNTER — Ambulatory Visit (INDEPENDENT_AMBULATORY_CARE_PROVIDER_SITE_OTHER): Payer: Medicare HMO | Admitting: Cardiovascular Disease

## 2022-02-26 VITALS — BP 126/64 | HR 63 | Ht 70.5 in | Wt 190.6 lb

## 2022-02-26 DIAGNOSIS — I2510 Atherosclerotic heart disease of native coronary artery without angina pectoris: Secondary | ICD-10-CM

## 2022-02-26 MED ORDER — METOPROLOL TARTRATE 25 MG PO TABS: 12.5000 mg | ORAL_TABLET | Freq: Two times a day (BID) | ORAL | 3 refills | Status: DC

## 2022-02-26 MED ORDER — LOSARTAN POTASSIUM 50 MG PO TABS: 50.0000 mg | ORAL_TABLET | Freq: Every day | ORAL | 3 refills | Status: DC

## 2022-02-26 NOTE — Patient Instructions (Signed)
Medication Instructions:  ? ?DECREASE YOUR METOPROLOL TARTRATE TO 12.5 MG BY MOUTH TWICE DAILY ? ?DECREASE YOUR LOSARTAN TO 50 MG BY MOUTH DAILY ? ?*If you need a refill on your cardiac medications before your next appointment, please call your pharmacy* ? ? ?Follow-Up: ?At [facility], you and your health needs are our priority.  As part of our continuing mission to provide you with exceptional heart care, we have created designated Provider Care Teams.  These Care Teams include your primary Cardiologist (physician) and Advanced Practice Providers (APPs -  Physician Assistants and Nurse Practitioners) who all work together to provide you with the care you need, when you need it. ? ?We recommend signing up for the patient portal called "MyChart".  Sign up information is provided on this After Visit Summary.  MyChart is used to connect with patients for Virtual Visits (Telemedicine).  Patients are able to view lab/test results, encounter notes, upcoming appointments, etc.  Non-urgent messages can be sent to your provider as well.   ?To learn more about what you can do with MyChart, go to [email or web address].   ? ?Your next appointment:   ?3 month(s) ? ?The format for your next appointment:   ?In Person ? ?Provider:   ?Robbie Lis, PA-C, Nicholes Rough, PA-C, Melina Copa, PA-C, Ambrose Pancoast, NP, Cecilie Kicks, NP, Ermalinda Barrios, PA-C, Christen Bame, NP, or Richardson Dopp, PA-C     { ? ? ?Important Information About Sugar ? ? ? ? ? ? ?

## 2022-03-01 DIAGNOSIS — Z0100 Encounter for examination of eyes and vision without abnormal findings: Secondary | ICD-10-CM | POA: Diagnosis not present

## 2022-03-08 ENCOUNTER — Other Ambulatory Visit: Payer: Self-pay | Admitting: Internal Medicine

## 2022-03-15 ENCOUNTER — Inpatient Hospital Stay: Payer: Medicare HMO | Attending: Hematology & Oncology

## 2022-03-15 ENCOUNTER — Inpatient Hospital Stay (HOSPITAL_BASED_OUTPATIENT_CLINIC_OR_DEPARTMENT_OTHER): Payer: Medicare HMO | Admitting: Hematology & Oncology

## 2022-03-15 ENCOUNTER — Encounter: Payer: Self-pay | Admitting: Hematology & Oncology

## 2022-03-15 ENCOUNTER — Inpatient Hospital Stay: Payer: Medicare HMO

## 2022-03-15 VITALS — BP 140/55 | HR 60 | Temp 98.4°F | Resp 20 | Wt 189.0 lb

## 2022-03-15 DIAGNOSIS — D500 Iron deficiency anemia secondary to blood loss (chronic): Secondary | ICD-10-CM

## 2022-03-15 DIAGNOSIS — C7A8 Other malignant neuroendocrine tumors: Secondary | ICD-10-CM | POA: Insufficient documentation

## 2022-03-15 DIAGNOSIS — C7B8 Other secondary neuroendocrine tumors: Secondary | ICD-10-CM | POA: Diagnosis not present

## 2022-03-15 DIAGNOSIS — D509 Iron deficiency anemia, unspecified: Secondary | ICD-10-CM | POA: Insufficient documentation

## 2022-03-15 LAB — CBC WITH DIFFERENTIAL (CANCER CENTER ONLY)
Abs Immature Granulocytes: 0.02 10*3/uL (ref 0.00–0.07)
Basophils Absolute: 0.1 10*3/uL (ref 0.0–0.1)
Basophils Relative: 1 %
Eosinophils Absolute: 0.3 10*3/uL (ref 0.0–0.5)
Eosinophils Relative: 5 %
HCT: 35.1 % — ABNORMAL LOW (ref 39.0–52.0)
Hemoglobin: 11.4 g/dL — ABNORMAL LOW (ref 13.0–17.0)
Immature Granulocytes: 0 %
Lymphocytes Relative: 12 %
Lymphs Abs: 0.7 10*3/uL (ref 0.7–4.0)
MCH: 30.6 pg (ref 26.0–34.0)
MCHC: 32.5 g/dL (ref 30.0–36.0)
MCV: 94.4 fL (ref 80.0–100.0)
Monocytes Absolute: 0.4 10*3/uL (ref 0.1–1.0)
Monocytes Relative: 7 %
Neutro Abs: 4.2 10*3/uL (ref 1.7–7.7)
Neutrophils Relative %: 75 %
Platelet Count: 148 10*3/uL — ABNORMAL LOW (ref 150–400)
RBC: 3.72 MIL/uL — ABNORMAL LOW (ref 4.22–5.81)
RDW: 14 % (ref 11.5–15.5)
WBC Count: 5.6 10*3/uL (ref 4.0–10.5)
nRBC: 0 % (ref 0.0–0.2)

## 2022-03-15 LAB — FERRITIN: Ferritin: 154 ng/mL (ref 24–336)

## 2022-03-15 LAB — CMP (CANCER CENTER ONLY)
ALT: 12 U/L (ref 0–44)
AST: 20 U/L (ref 15–41)
Albumin: 4 g/dL (ref 3.5–5.0)
Alkaline Phosphatase: 51 U/L (ref 38–126)
Anion gap: 6 (ref 5–15)
BUN: 14 mg/dL (ref 8–23)
CO2: 32 mmol/L (ref 22–32)
Calcium: 9.7 mg/dL (ref 8.9–10.3)
Chloride: 98 mmol/L (ref 98–111)
Creatinine: 1.06 mg/dL (ref 0.61–1.24)
GFR, Estimated: >60 mL/min (ref >60)
Glucose, Bld: 114 mg/dL — ABNORMAL HIGH (ref 70–99)
Potassium: 4.3 mmol/L (ref 3.5–5.1)
Sodium: 136 mmol/L (ref 135–145)
Total Bilirubin: 0.8 mg/dL (ref 0.3–1.2)
Total Protein: 6.8 g/dL (ref 6.5–8.1)

## 2022-03-15 LAB — LACTATE DEHYDROGENASE: LDH: 163 U/L (ref 98–192)

## 2022-03-15 LAB — IRON AND IRON BINDING CAPACITY (CC-WL,HP ONLY)
Iron: 69 ug/dL (ref 45–182)
Saturation Ratios: 22 % (ref 17.9–39.5)
TIBC: 311 ug/dL (ref 250–450)
UIBC: 242 ug/dL (ref 117–376)

## 2022-03-15 MED ORDER — LANREOTIDE ACETATE 120 MG/0.5ML ~~LOC~~ SOLN
120.0000 mg | Freq: Once | SUBCUTANEOUS | Status: AC
  Administered 2022-03-15: 120 mg via SUBCUTANEOUS
  Filled 2022-03-15: qty 120

## 2022-03-15 NOTE — Progress Notes (Signed)
?Hematology and Oncology Follow Up Visit ? ?Brian Mays ?[id number] ?Dec 15, 1945 76 y.o. ?03/15/2022 ? ? ?Principle Diagnosis:  ?Metastatic low grade neuroendocrine tumor-hepatic metastases ?Iron deficiency anemia ?  ?Past Therapy: ?S/p yttrium-90 intrahepatic therapy - November 2016 ?Lutathera (Lu 177) injection on 02/01/2018 - s/p cycle 4 ?SBRT to T6 lesion -- 11/06/2021 ?  ?Current Therapy:        ?Somatuline 120 mg monthly ?IV Iron as indicated  --Injectafer given on 01/2021 ?  ?Interim History:  Brian Mays is here today for follow-up.  He is doing pretty well.  He has had no problems himself since we last saw him.  He is playing golf once a week. ? ?His last Chromogranin A level was down 151. ? ?Unfortunately, his poor wife is having problems with her brain cancer.  She is now on hospice.  He is doing her best to try to help her. ? ?He has had no chest pain.  He has had no cough.  He has had no nausea or vomiting.  He has had no change in bowel or bladder habits.  He has had no leg swelling.  There is been no rashes. ? ?His iron studies back in April showed a ferritin of 256 with an iron saturation of 24%.  Thankfully, we have not had to give him Injectafer for about a year. ? ?Overall, I would say his performance status for now is probably ECOG 1.   ? ? ?Medications:  ?Allergies as of 03/15/2022   ? ?   Reactions  ? Iohexol Hives  ?  Code: HIVES, Desc: PER [name] @ [facility]IMARY [facility], PT IS ALLERGIC TO CONTRAST DYE 10/02/08/RM  05/01/10...needs full premeds per our protocol w/ [facility]., Onset Date: [id number]  ? Iodinated Contrast Media Hives  ? ?  ? ?  ?Medication List  ?  ? ?  ? Accurate as of Mar 15, 2022  1:06 PM. If you have any questions, ask your nurse or doctor.  ?  ?  ? ?  ? ?aspirin EC 81 MG tablet ?Take 81 mg by mouth every morning. ?  ?atorvastatin 80 MG tablet ?Commonly known as: LIPITOR ?Take 80 mg by mouth daily. ?  ?Cholecalciferol 25 MCG (1000 UT) tablet ?Take 1,000 Units by mouth 2 (two) times daily. ?   ?clopidogrel 75 MG tablet ?Commonly known as: PLAVIX ?TAKE 1 TABLET BY MOUTH EVERY DAY ?  ?diphenhydramine-acetaminophen 25-500 MG Tabs tablet ?Commonly known as: TYLENOL PM ?Take 1 tablet by mouth at bedtime as needed (sleep/pain). ?  ?fluticasone 50 MCG/ACT nasal spray ?Commonly known as: FLONASE ?Place 2 sprays into both nostrils daily as needed (sinuses). ?  ?hydrochlorothiazide 25 MG tablet ?Commonly known as: HYDRODIURIL ?Take 0.5 tablets (12.5 mg total) by mouth daily. ?  ?hydrocortisone 25 MG suppository ?Commonly known as: ANUSOL-HC ?INSERT 1 SUPPOSITORY RECTALLY TWICE A DAY AS NEEDED FOR HEMORRHOIDS ?  ?ipratropium 0.03 % nasal spray ?Commonly known as: Atrovent ?Place 2 sprays into the nose 3 (three) times daily. ?  ?lansoprazole 30 MG capsule ?Commonly known as: PREVACID ?Take 1 capsule (30 mg total) by mouth daily at 12 noon. ?  ?levothyroxine 50 MCG tablet ?Commonly known as: SYNTHROID ?TAKE 1 TABLET BY MOUTH EVERY DAY BEFORE BREAKFAST ?  ?losartan 50 MG tablet ?Commonly known as: COZAAR ?Take 1 tablet (50 mg total) by mouth daily. ?  ?metoprolol tartrate 25 MG tablet ?Commonly known as: LOPRESSOR ?Take 0.5 tablets (12.5 mg total) by mouth 2 (two) times daily. ?  ?  nitroGLYCERIN 0.4 MG SL tablet ?Commonly known as: NITROSTAT ?PLACE 1 TABLET UNDER THE TONGUE EVERY 5 (FIVE) MINUTES X 3 DOSES AS NEEDED FOR CHEST PAIN. ?  ?polyethylene glycol 17 g packet ?Commonly known as: MIRALAX / GLYCOLAX ?Take 17 g by mouth daily. ?  ?ranolazine 500 MG 12 hr tablet ?Commonly known as: RANEXA ?Take 500 mg by mouth 2 (two) times daily. ?  ?SOMATULINE DEPOT [place] ?Inject 120 mcg into the skin every 28 (twenty-eight) days. Receives at Dr Antonieta Pert office ?  ?SOOTHE XP OP ?Place 1 drop into both eyes daily as needed (dry eyes). ?  ?vitamin B-12 1000 MCG tablet ?Commonly known as: CYANOCOBALAMIN ?Take 1,000 mcg by mouth every other day. ?  ? ?  ? ? ?Allergies:  ?Allergies  ?Allergen Reactions  ? Iohexol Hives  ?   Code: HIVES,  Desc: PER [name] @ [facility], PT IS ALLERGIC TO CONTRAST DYE 10/02/08/RM  05/01/10...needs full premeds per our protocol w/ [facility]., Onset Date: [id number] ?  ? Iodinated Contrast Media Hives  ? ? ?Past Medical History, Surgical history, Social history, and Family History were reviewed and updated. ? ?Review of Systems: ?Review of Systems  ?Constitutional: Negative.   ?HENT: Negative.    ?Eyes: Negative.   ?Respiratory: Negative.    ?Cardiovascular: Negative.   ?Gastrointestinal: Negative.   ?Genitourinary: Negative.   ?Musculoskeletal: Negative.   ?Skin: Negative.   ?Neurological: Negative.   ?Endo/Heme/Allergies: Negative.   ?Psychiatric/Behavioral: Negative.    ? ? ?Physical Exam: ? weight is 189 lb 0.6 oz (85.7 kg). His oral temperature is 98.4 ?F (36.9 ?C). His blood pressure is 140/55 (abnormal) and his pulse is 60. His respiration is 20 and oxygen saturation is 100%.  ? ?Wt Readings from Last 3 Encounters:  ?03/15/22 189 lb 0.6 oz (85.7 kg)  ?02/26/22 190 lb 9.6 oz (86.5 kg)  ?02/22/22 185 lb (83.9 kg)  ? ? ?Physical Exam ?Vitals reviewed.  ?HENT:  ?   Head: Normocephalic and atraumatic.  ?Eyes:  ?   Pupils: Pupils are equal, round, and reactive to light.  ?Cardiovascular:  ?   Rate and Rhythm: Normal rate and regular rhythm.  ?   Heart sounds: Normal heart sounds.  ?Pulmonary:  ?   Effort: Pulmonary effort is normal.  ?   Breath sounds: Normal breath sounds.  ?Abdominal:  ?   General: Bowel sounds are normal.  ?   Palpations: Abdomen is soft.  ?Musculoskeletal:     ?   General: No tenderness or deformity. Normal range of motion.  ?   Cervical back: Normal range of motion.  ?Lymphadenopathy:  ?   Cervical: No cervical adenopathy.  ?Skin: ?   General: Skin is warm and dry.  ?   Findings: No erythema or rash.  ?Neurological:  ?   Mental Status: He is alert and oriented to person, place, and time.  ?Psychiatric:     ?   Behavior: Behavior normal.     ?   Thought Content: Thought content normal.     ?    Judgment: Judgment normal.  ? ? ? ?Lab Results  ?Component Value Date  ? WBC 5.6 03/15/2022  ? HGB 11.4 (L) 03/15/2022  ? HCT 35.1 (L) 03/15/2022  ? MCV 94.4 03/15/2022  ? PLT 148 (L) 03/15/2022  ? ?Lab Results  ?Component Value Date  ? FERRITIN 256 02/15/2022  ? IRON 75 02/15/2022  ? TIBC 315 02/15/2022  ? UIBC 240 02/15/2022  ? IRONPCTSAT  24 02/15/2022  ? ?Lab Results  ?Component Value Date  ? RETICCTPCT 1.5 07/23/2021  ? RBC 3.72 (L) 03/15/2022  ? ?No results found for: KPAFRELGTCHN, LAMBDASER, KAPLAMBRATIO ?No results found for: IGGSERUM, IGA, IGMSERUM ?No results found for: TOTALPROTELP, ALBUMINELP, A1GS, A2GS, BETS, BETA2SER, GAMS, MSPIKE, SPEI ?  Chemistry   ?   ?Component Value Date/Time  ? NA 136 03/15/2022 1145  ? NA 139 12/11/2021 1221  ? NA 144 10/04/2017 0904  ? NA 141 07/23/2016 1255  ? K 4.3 03/15/2022 1145  ? K 3.8 10/04/2017 0904  ? K 4.1 07/23/2016 1255  ? CL 98 03/15/2022 1145  ? CL 103 10/04/2017 0904  ? CO2 32 03/15/2022 1145  ? CO2 28 10/04/2017 0904  ? CO2 27 07/23/2016 1255  ? BUN 14 03/15/2022 1145  ? BUN 13 12/11/2021 1221  ? BUN 14 10/04/2017 0904  ? BUN 13.8 07/23/2016 1255  ? CREATININE 1.06 03/15/2022 1145  ? CREATININE 0.8 10/04/2017 0904  ? CREATININE 1.0 07/23/2016 1255  ?    ?Component Value Date/Time  ? CALCIUM 9.7 03/15/2022 1145  ? CALCIUM 9.4 10/04/2017 0904  ? CALCIUM 9.2 07/23/2016 1255  ? ALKPHOS 51 03/15/2022 1145  ? ALKPHOS 64 10/04/2017 0904  ? ALKPHOS 77 07/23/2016 1255  ? AST 20 03/15/2022 1145  ? AST 23 07/23/2016 1255  ? ALT 12 03/15/2022 1145  ? ALT 22 10/04/2017 0904  ? ALT 13 07/23/2016 1255  ? BILITOT 0.8 03/15/2022 1145  ? BILITOT 1.13 07/23/2016 1255  ?  ? ? ? ?Impression and Plan: Brian Mays is a very pleasant 76 yo caucasian gentleman with metastatic low-grade neuroendocrine carcinoma with hepatic metastasis.   ? ?He completed radiation therapy to T6.  He was not symptomatic in that area but yet, I felt that radiation to that area would be helpful. ? ?We will  continue him on the Somatuline.  I am glad that the Chromogranin A level was down.  I know this does tend to fluctuate. ? ?I does want him to be healthy enough to be able to help his poor wife.  I know this is a

## 2022-03-15 NOTE — Patient Instructions (Signed)
Lanreotide injection What is this medication? LANREOTIDE (lan REE oh tide) is used to reduce blood levels of growth hormone in patients with a condition called acromegaly. It also works to slow or stop tumor growth in patients with neuroendocrine tumors and treat carcinoid syndrome. This medicine may be used for other purposes; ask your health care provider or pharmacist if you have questions. COMMON BRAND NAME(S): Somatuline [place] What should I tell my care team before I take this medication? They need to know if you have any of these conditions: diabetes gallbladder disease heart disease kidney disease liver disease thyroid disease an unusual or allergic reaction to lanreotide, other medicines, foods, dyes, or preservatives pregnant or trying to get pregnant breast-feeding How should I use this medication? This medicine is for injection under the skin. It is given by a health care professional in a hospital or clinic setting. Contact your pediatrician or health care professional regarding the use of this medicine in children. Special care may be needed. Overdosage: If you think you have taken too much of this medicine contact a poison control center or emergency room at once. NOTE: This medicine is only for you. Do not share this medicine with others. What if I miss a dose? It is important not to miss your dose. Call your doctor or health care professional if you are unable to keep an appointment. What may interact with this medication? This medicine may interact with the following medications: bromocriptine cyclosporine certain medicines for blood pressure, heart disease, irregular heart beat certain medicines for diabetes quinidine terfenadine This list may not describe all possible interactions. Give your health care provider a list of all the medicines, herbs, non-prescription drugs, or dietary supplements you use. Also tell them if you smoke, drink alcohol, or use illegal drugs.  Some items may interact with your medicine. What should I watch for while using this medication? Tell your doctor or healthcare professional if your symptoms do not start to get better or if they get worse. Visit your doctor or health care professional for regular checks on your progress. Your condition will be monitored carefully while you are receiving this medicine. This medicine may increase blood sugar. Ask your healthcare provider if changes in diet or medicines are needed if you have diabetes. You may need blood work done while you are taking this medicine. Women should inform their doctor if they wish to become pregnant or think they might be pregnant. There is a potential for serious side effects to an unborn child. Talk to your health care professional or pharmacist for more information. Do not breast-feed an infant while taking this medicine or for 6 months after stopping it. This medicine has caused ovarian failure in some women. This medicine may interfere with the ability to have a child. Talk with your doctor or health care professional if you are concerned about your fertility. What side effects may I notice from receiving this medication? Side effects that you should report to your doctor or health care professional as soon as possible: allergic reactions like skin rash, itching or hives, swelling of the face, lips, or tongue increased blood pressure severe stomach pain signs and symptoms of hgh blood sugar such as being more thirsty or hungry or having to urinate more than normal. You may also feel very tired or have blurry vision. signs and symptoms of low blood sugar such as feeling anxious; confusion; dizziness; increased hunger; unusually weak or tired; sweating; shakiness; cold; irritable; headache; blurred vision; fast   heartbeat; loss of consciousness unusually slow heartbeat Side effects that usually do not require medical attention (report to your doctor or health care  professional if they continue or are bothersome): constipation diarrhea dizziness headache muscle pain muscle spasms nausea pain, redness, or irritation at site where injected This list may not describe all possible side effects. Call your doctor for medical advice about side effects. You may report side effects to FDA at 1-800-FDA-1088. Where should I keep my medication? This drug is given in a hospital or clinic and will not be stored at home. NOTE: This sheet is a summary. It may not cover all possible information. If you have questions about this medicine, talk to your doctor, pharmacist, or health care provider.  2023 Elsevier/Gold Standard (2018-09-06 00:00:00)  

## 2022-03-16 LAB — CHROMOGRANIN A: Chromogranin A (ng/mL): 132.1 ng/mL — ABNORMAL HIGH (ref 0.0–101.8)

## 2022-04-02 ENCOUNTER — Telehealth: Payer: Self-pay | Admitting: Radiation Therapy

## 2022-04-02 ENCOUNTER — Other Ambulatory Visit: Payer: Self-pay | Admitting: Radiation Therapy

## 2022-04-02 DIAGNOSIS — C7951 Secondary malignant neoplasm of bone: Secondary | ICD-10-CM

## 2022-04-02 NOTE — Telephone Encounter (Signed)
Left a detailed message for the patient on his cell phone regarding his upcoming thoracic spine MRI and telephone follow-up with Shona Simpson PA-C.   I have included my contact information and requested a call back to confirm these appointments.   Mont Dutton R.T.(R)(T) Radiation Special Procedures Navigator

## 2022-04-16 ENCOUNTER — Inpatient Hospital Stay: Payer: Medicare HMO | Attending: Hematology & Oncology

## 2022-04-16 ENCOUNTER — Inpatient Hospital Stay: Payer: Medicare HMO

## 2022-04-16 ENCOUNTER — Encounter: Payer: Self-pay | Admitting: Hematology & Oncology

## 2022-04-16 ENCOUNTER — Other Ambulatory Visit: Payer: Self-pay | Admitting: Oncology

## 2022-04-16 ENCOUNTER — Inpatient Hospital Stay (HOSPITAL_BASED_OUTPATIENT_CLINIC_OR_DEPARTMENT_OTHER): Payer: Medicare HMO | Admitting: Hematology & Oncology

## 2022-04-16 ENCOUNTER — Other Ambulatory Visit: Payer: Self-pay

## 2022-04-16 VITALS — BP 129/55 | HR 56 | Temp 98.4°F | Resp 18 | Wt 186.0 lb

## 2022-04-16 DIAGNOSIS — D500 Iron deficiency anemia secondary to blood loss (chronic): Secondary | ICD-10-CM

## 2022-04-16 DIAGNOSIS — C7B8 Other secondary neuroendocrine tumors: Secondary | ICD-10-CM

## 2022-04-16 DIAGNOSIS — C7A8 Other malignant neuroendocrine tumors: Secondary | ICD-10-CM | POA: Insufficient documentation

## 2022-04-16 DIAGNOSIS — D509 Iron deficiency anemia, unspecified: Secondary | ICD-10-CM | POA: Insufficient documentation

## 2022-04-16 LAB — CBC WITH DIFFERENTIAL (CANCER CENTER ONLY)
Abs Immature Granulocytes: 0.04 10*3/uL (ref 0.00–0.07)
Basophils Absolute: 0 10*3/uL (ref 0.0–0.1)
Basophils Relative: 1 %
Eosinophils Absolute: 0.3 10*3/uL (ref 0.0–0.5)
Eosinophils Relative: 5 %
HCT: 36.6 % — ABNORMAL LOW (ref 39.0–52.0)
Hemoglobin: 11.8 g/dL — ABNORMAL LOW (ref 13.0–17.0)
Immature Granulocytes: 1 %
Lymphocytes Relative: 15 %
Lymphs Abs: 0.9 10*3/uL (ref 0.7–4.0)
MCH: 30.3 pg (ref 26.0–34.0)
MCHC: 32.2 g/dL (ref 30.0–36.0)
MCV: 93.8 fL (ref 80.0–100.0)
Monocytes Absolute: 0.4 10*3/uL (ref 0.1–1.0)
Monocytes Relative: 7 %
Neutro Abs: 4.3 10*3/uL (ref 1.7–7.7)
Neutrophils Relative %: 71 %
Platelet Count: 151 10*3/uL (ref 150–400)
RBC: 3.9 MIL/uL — ABNORMAL LOW (ref 4.22–5.81)
RDW: 14.1 % (ref 11.5–15.5)
WBC Count: 6 10*3/uL (ref 4.0–10.5)
nRBC: 0 % (ref 0.0–0.2)

## 2022-04-16 LAB — CMP (CANCER CENTER ONLY)
ALT: 12 U/L (ref 0–44)
AST: 20 U/L (ref 15–41)
Albumin: 4 g/dL (ref 3.5–5.0)
Alkaline Phosphatase: 49 U/L (ref 38–126)
Anion gap: 6 (ref 5–15)
BUN: 17 mg/dL (ref 8–23)
CO2: 32 mmol/L (ref 22–32)
Calcium: 9.6 mg/dL (ref 8.9–10.3)
Chloride: 102 mmol/L (ref 98–111)
Creatinine: 0.73 mg/dL (ref 0.61–1.24)
GFR, Estimated: >60 mL/min (ref >60)
Glucose, Bld: 102 mg/dL — ABNORMAL HIGH (ref 70–99)
Potassium: 4.5 mmol/L (ref 3.5–5.1)
Sodium: 140 mmol/L (ref 135–145)
Total Bilirubin: 0.7 mg/dL (ref 0.3–1.2)
Total Protein: 7.3 g/dL (ref 6.5–8.1)

## 2022-04-16 LAB — IRON AND IRON BINDING CAPACITY (CC-WL,HP ONLY)
Iron: 61 ug/dL (ref 45–182)
Saturation Ratios: 20 % (ref 17.9–39.5)
TIBC: 308 ug/dL (ref 250–450)
UIBC: 247 ug/dL (ref 117–376)

## 2022-04-16 LAB — FERRITIN: Ferritin: 118 ng/mL (ref 24–336)

## 2022-04-16 MED ORDER — LANREOTIDE ACETATE 120 MG/0.5ML ~~LOC~~ SOLN
120.0000 mg | Freq: Once | SUBCUTANEOUS | Status: AC
  Administered 2022-04-16: 120 mg via SUBCUTANEOUS
  Filled 2022-04-16: qty 120

## 2022-04-16 NOTE — Progress Notes (Signed)
Hematology and Oncology Follow Up Visit  SHAIL URBAS [id number] May 13, 1946 76 y.o. 04/16/2022   Principle Diagnosis:  Metastatic low grade neuroendocrine tumor-hepatic metastases Iron deficiency anemia   Past Therapy: S/p yttrium-90 intrahepatic therapy - November 2016 Lutathera (Lu 177) injection on 02/01/2018 - s/p cycle 4 SBRT to T6 lesion -- 11/06/2021   Current Therapy:        Somatuline 120 mg monthly IV Iron as indicated  --Injectafer given on 01/2021   Interim History:  Mr. Betker is here today for follow-up.  He is managing the best that he can.  His wife is on hospice.  Sounds like she is going down pretty quickly.  I just feel bad for him.  I know he is trying his best to help her out.  His last Chromogranin A level was 132.  I am happy about that.  He has had no problems with pain.  He has had no cardiac issues.  He does see his cardiologist.  He has had no diarrhea.  There has been no rashes.  He has had no leg swelling.  His last iron studies back in May showed a ferritin of 154 with an iron saturation of 22%.  Currently, his performance status is ECOG 1.    Medications:  Allergies as of 04/16/2022       Reactions   Iohexol Hives    Code: HIVES, Desc: PER [name] @ [facility]IMARY [facility]ARE, PT IS ALLERGIC TO CONTRAST DYE 10/02/08/RM  05/01/10...needs full premeds per our protocol w/ [facility]., Onset Date: [id number]   Iodinated Contrast Media Hives        Medication List        Accurate as of April 16, 2022 11:48 AM. If you have any questions, ask your nurse or doctor.          aspirin EC 81 MG tablet Take 81 mg by mouth every morning.   atorvastatin 80 MG tablet Commonly known as: LIPITOR Take 80 mg by mouth daily.   Cholecalciferol 25 MCG (1000 UT) tablet Take 1,000 Units by mouth 2 (two) times daily.   clopidogrel 75 MG tablet Commonly known as: PLAVIX TAKE 1 TABLET BY MOUTH EVERY DAY   diphenhydramine-acetaminophen 25-500 MG Tabs tablet Commonly  known as: TYLENOL PM Take 1 tablet by mouth at bedtime as needed (sleep/pain).   fluticasone 50 MCG/ACT nasal spray Commonly known as: FLONASE Place 2 sprays into both nostrils daily as needed (sinuses).   hydrochlorothiazide 25 MG tablet Commonly known as: HYDRODIURIL Take 0.5 tablets (12.5 mg total) by mouth daily.   hydrocortisone 25 MG suppository Commonly known as: ANUSOL-HC INSERT 1 SUPPOSITORY RECTALLY TWICE A DAY AS NEEDED FOR HEMORRHOIDS   ipratropium 0.03 % nasal spray Commonly known as: Atrovent Place 2 sprays into the nose 3 (three) times daily.   lansoprazole 30 MG capsule Commonly known as: PREVACID Take 1 capsule (30 mg total) by mouth daily at 12 noon.   levothyroxine 50 MCG tablet Commonly known as: SYNTHROID TAKE 1 TABLET BY MOUTH EVERY DAY BEFORE BREAKFAST   losartan 50 MG tablet Commonly known as: COZAAR Take 1 tablet (50 mg total) by mouth daily.   metoprolol tartrate 25 MG tablet Commonly known as: LOPRESSOR Take 0.5 tablets (12.5 mg total) by mouth 2 (two) times daily.   nitroGLYCERIN 0.4 MG SL tablet Commonly known as: NITROSTAT PLACE 1 TABLET UNDER THE TONGUE EVERY 5 (FIVE) MINUTES X 3 DOSES AS NEEDED FOR CHEST PAIN.   polyethylene glycol 17  g packet Commonly known as: MIRALAX / GLYCOLAX Take 17 g by mouth daily.   ranolazine 500 MG 12 hr tablet Commonly known as: RANEXA Take 500 mg by mouth 2 (two) times daily.   SOMATULINE DEPOT [place] Inject 120 mcg into the skin every 28 (twenty-eight) days. Receives at Dr Antonieta Pert office   SOOTHE XP OP Place 1 drop into both eyes daily as needed (dry eyes).   vitamin B-12 1000 MCG tablet Commonly known as: CYANOCOBALAMIN Take 1,000 mcg by mouth every other day.        Allergies:  Allergies  Allergen Reactions   Iohexol Hives     Code: HIVES, Desc: PER [name] @ [facility]IMARY [facility]ARE, PT IS ALLERGIC TO CONTRAST DYE 10/02/08/RM  05/01/10...needs full premeds per our protocol w/ [facility]., Onset Date:  [id number]    Iodinated Contrast Media Hives    Past Medical History, Surgical history, Social history, and Family History were reviewed and updated.  Review of Systems: Review of Systems  Constitutional: Negative.   HENT: Negative.    Eyes: Negative.   Respiratory: Negative.    Cardiovascular: Negative.   Gastrointestinal: Negative.   Genitourinary: Negative.   Musculoskeletal: Negative.   Skin: Negative.   Neurological: Negative.   Endo/Heme/Allergies: Negative.   Psychiatric/Behavioral: Negative.       Physical Exam:  weight is 186 lb (84.4 kg). His oral temperature is 98.4 F (36.9 C). His blood pressure is 129/55 (abnormal) and his pulse is 56 (abnormal). His respiration is 18 and oxygen saturation is 100%.   Wt Readings from Last 3 Encounters:  04/16/22 186 lb (84.4 kg)  03/15/22 189 lb 0.6 oz (85.7 kg)  02/26/22 190 lb 9.6 oz (86.5 kg)    Physical Exam Vitals reviewed.  HENT:     Head: Normocephalic and atraumatic.  Eyes:     Pupils: Pupils are equal, round, and reactive to light.  Cardiovascular:     Rate and Rhythm: Normal rate and regular rhythm.     Heart sounds: Normal heart sounds.  Pulmonary:     Effort: Pulmonary effort is normal.     Breath sounds: Normal breath sounds.  Abdominal:     General: Bowel sounds are normal.     Palpations: Abdomen is soft.  Musculoskeletal:        General: No tenderness or deformity. Normal range of motion.     Cervical back: Normal range of motion.  Lymphadenopathy:     Cervical: No cervical adenopathy.  Skin:    General: Skin is warm and dry.     Findings: No erythema or rash.  Neurological:     Mental Status: He is alert and oriented to person, place, and time.  Psychiatric:        Behavior: Behavior normal.        Thought Content: Thought content normal.        Judgment: Judgment normal.     Lab Results  Component Value Date   WBC 6.0 04/16/2022   HGB 11.8 (L) 04/16/2022   HCT 36.6 (L) 04/16/2022    MCV 93.8 04/16/2022   PLT 151 04/16/2022   Lab Results  Component Value Date   FERRITIN 154 03/15/2022   IRON 69 03/15/2022   TIBC 311 03/15/2022   UIBC 242 03/15/2022   IRONPCTSAT 22 03/15/2022   Lab Results  Component Value Date   RETICCTPCT 1.5 07/23/2021   RBC 3.90 (L) 04/16/2022   No results found for: "KPAFRELGTCHN", "LAMBDASER", "KAPLAMBRATIO" No results found  for: "IGGSERUM", "IGA", "IGMSERUM" No results found for: "TOTALPROTELP", "ALBUMINELP", "A1GS", "A2GS", "BETS", "BETA2SER", "GAMS", "MSPIKE", "SPEI"   Chemistry      Component Value Date/Time   NA 140 04/16/2022 1027   NA 139 12/11/2021 1221   NA 144 10/04/2017 0904   NA 141 07/23/2016 1255   K 4.5 04/16/2022 1027   K 3.8 10/04/2017 0904   K 4.1 07/23/2016 1255   CL 102 04/16/2022 1027   CL 103 10/04/2017 0904   CO2 32 04/16/2022 1027   CO2 28 10/04/2017 0904   CO2 27 07/23/2016 1255   BUN 17 04/16/2022 1027   BUN 13 12/11/2021 1221   BUN 14 10/04/2017 0904   BUN 13.8 07/23/2016 1255   CREATININE 0.73 04/16/2022 1027   CREATININE 0.8 10/04/2017 0904   CREATININE 1.0 07/23/2016 1255      Component Value Date/Time   CALCIUM 9.6 04/16/2022 1027   CALCIUM 9.4 10/04/2017 0904   CALCIUM 9.2 07/23/2016 1255   ALKPHOS 49 04/16/2022 1027   ALKPHOS 64 10/04/2017 0904   ALKPHOS 77 07/23/2016 1255   AST 20 04/16/2022 1027   AST 23 07/23/2016 1255   ALT 12 04/16/2022 1027   ALT 22 10/04/2017 0904   ALT 13 07/23/2016 1255   BILITOT 0.7 04/16/2022 1027   BILITOT 1.13 07/23/2016 1255       Impression and Plan: Mr. Wissner is a very pleasant 76 yo caucasian gentleman with metastatic low-grade neuroendocrine carcinoma with hepatic metastasis.    He completed radiation therapy to T6.  He was not symptomatic in that area but yet, I felt that radiation to that area would be helpful.  I think he says he has an MRI scheduled for early July.  We will continue him on the Somatuline.  I am glad that the Chromogranin A  level was down.  I know this does tend to fluctuate.  I does want him to be healthy enough to be able to help his poor wife.  I know this is a priority for him.  Her quality of life for however long that she has is certainly a focus for him.  We will plan to get him back in another month.  Volanda Napoleon, MD 6/16/202311:48 AM

## 2022-04-16 NOTE — Patient Instructions (Signed)
Lanreotide injection What is this medication? LANREOTIDE (lan REE oh tide) is used to reduce blood levels of growth hormone in patients with a condition called acromegaly. It also works to slow or stop tumor growth in patients with neuroendocrine tumors and treat carcinoid syndrome. This medicine may be used for other purposes; ask your health care provider or pharmacist if you have questions. COMMON BRAND NAME(S): Somatuline [place] What should I tell my care team before I take this medication? They need to know if you have any of these conditions: diabetes gallbladder disease heart disease kidney disease liver disease thyroid disease an unusual or allergic reaction to lanreotide, other medicines, foods, dyes, or preservatives pregnant or trying to get pregnant breast-feeding How should I use this medication? This medicine is for injection under the skin. It is given by a health care professional in a hospital or clinic setting. Contact your pediatrician or health care professional regarding the use of this medicine in children. Special care may be needed. Overdosage: If you think you have taken too much of this medicine contact a poison control center or emergency room at once. NOTE: This medicine is only for you. Do not share this medicine with others. What if I miss a dose? It is important not to miss your dose. Call your doctor or health care professional if you are unable to keep an appointment. What may interact with this medication? This medicine may interact with the following medications: bromocriptine cyclosporine certain medicines for blood pressure, heart disease, irregular heart beat certain medicines for diabetes quinidine terfenadine This list may not describe all possible interactions. Give your health care provider a list of all the medicines, herbs, non-prescription drugs, or dietary supplements you use. Also tell them if you smoke, drink alcohol, or use illegal drugs.  Some items may interact with your medicine. What should I watch for while using this medication? Tell your doctor or healthcare professional if your symptoms do not start to get better or if they get worse. Visit your doctor or health care professional for regular checks on your progress. Your condition will be monitored carefully while you are receiving this medicine. This medicine may increase blood sugar. Ask your healthcare provider if changes in diet or medicines are needed if you have diabetes. You may need blood work done while you are taking this medicine. Women should inform their doctor if they wish to become pregnant or think they might be pregnant. There is a potential for serious side effects to an unborn child. Talk to your health care professional or pharmacist for more information. Do not breast-feed an infant while taking this medicine or for 6 months after stopping it. This medicine has caused ovarian failure in some women. This medicine may interfere with the ability to have a child. Talk with your doctor or health care professional if you are concerned about your fertility. What side effects may I notice from receiving this medication? Side effects that you should report to your doctor or health care professional as soon as possible: allergic reactions like skin rash, itching or hives, swelling of the face, lips, or tongue increased blood pressure severe stomach pain signs and symptoms of hgh blood sugar such as being more thirsty or hungry or having to urinate more than normal. You may also feel very tired or have blurry vision. signs and symptoms of low blood sugar such as feeling anxious; confusion; dizziness; increased hunger; unusually weak or tired; sweating; shakiness; cold; irritable; headache; blurred vision; fast   heartbeat; loss of consciousness unusually slow heartbeat Side effects that usually do not require medical attention (report to your doctor or health care  professional if they continue or are bothersome): constipation diarrhea dizziness headache muscle pain muscle spasms nausea pain, redness, or irritation at site where injected This list may not describe all possible side effects. Call your doctor for medical advice about side effects. You may report side effects to FDA at 1-800-FDA-1088. Where should I keep my medication? This drug is given in a hospital or clinic and will not be stored at home. NOTE: This sheet is a summary. It may not cover all possible information. If you have questions about this medicine, talk to your doctor, pharmacist, or health care provider.  2023 Elsevier/Gold Standard (2018-09-06 00:00:00)  

## 2022-04-19 ENCOUNTER — Encounter: Payer: Self-pay | Admitting: *Deleted

## 2022-04-19 LAB — CHROMOGRANIN A: Chromogranin A (ng/mL): 128.9 ng/mL — ABNORMAL HIGH (ref 0.0–101.8)

## 2022-05-06 ENCOUNTER — Ambulatory Visit (HOSPITAL_COMMUNITY)
Admission: RE | Admit: 2022-05-06 | Discharge: 2022-05-06 | Disposition: A | Discharge status: Home / Self Care | Payer: Medicare HMO | Source: Ambulatory Visit | Attending: Radiation Oncology | Admitting: Radiation Oncology

## 2022-05-06 DIAGNOSIS — D1809 Hemangioma of other sites: Secondary | ICD-10-CM | POA: Diagnosis not present

## 2022-05-06 DIAGNOSIS — C7951 Secondary malignant neoplasm of bone: Secondary | ICD-10-CM | POA: Insufficient documentation

## 2022-05-06 MED ORDER — GADOBUTROL 1 MMOL/ML IV SOLN
8.5000 mL | Freq: Once | INTRAVENOUS | Status: AC | PRN
  Administered 2022-05-06: 8.5 mL via INTRAVENOUS

## 2022-05-10 ENCOUNTER — Encounter: Payer: Self-pay | Admitting: Radiation Oncology

## 2022-05-10 ENCOUNTER — Ambulatory Visit
Admission: RE | Admit: 2022-05-10 | Discharge: 2022-05-10 | Disposition: A | Discharge status: Home / Self Care | Payer: Medicare HMO | Source: Ambulatory Visit | Attending: Radiation Oncology | Admitting: Radiation Oncology

## 2022-05-10 ENCOUNTER — Inpatient Hospital Stay: Payer: Medicare HMO | Attending: Hematology & Oncology

## 2022-05-10 DIAGNOSIS — C7B8 Other secondary neuroendocrine tumors: Secondary | ICD-10-CM

## 2022-05-10 DIAGNOSIS — C7A8 Other malignant neuroendocrine tumors: Secondary | ICD-10-CM | POA: Insufficient documentation

## 2022-05-10 DIAGNOSIS — C7951 Secondary malignant neoplasm of bone: Secondary | ICD-10-CM

## 2022-05-10 DIAGNOSIS — C7A1 Malignant poorly differentiated neuroendocrine tumors: Secondary | ICD-10-CM | POA: Diagnosis not present

## 2022-05-10 DIAGNOSIS — D509 Iron deficiency anemia, unspecified: Secondary | ICD-10-CM | POA: Insufficient documentation

## 2022-05-10 NOTE — Progress Notes (Signed)
Telephone appointment. I verified patient's identity and began nursing interview. No issues reported at this time.  Meaningful use complete.  Reminded patient of his 1:30pm-05/10/22 telephone appointment w/ Shona Simpson PA-C. I left my extension [phone] in case patient needs anything. Patient verbalized understanding.  Patient contact [phone]

## 2022-05-10 NOTE — Addendum Note (Signed)
Encounter addended by: Hayden Pedro, PA-C on: 05/10/2022 2:12 PM  Actions taken: Level of Service modified

## 2022-05-10 NOTE — Progress Notes (Signed)
Radiation Oncology         [phone] ________________________________  Initial Outpatient Consultation - Conducted via telephone at patient request.  I spoke with the patient to conduct this consult visit via telephone. The patient was notified in advance and was offered an in person or telemedicine meeting to allow for face to face communication but instead preferred to proceed with a telephone consult.    Name: Brian Mays        MRN: [id number]  Date of Service: 05/10/2022 DOB: 06-Apr-1946  LF:[id number], Gay Filler, MD  Copland, Gay Filler, MD     REFERRING PHYSICIAN: Copland, Gay Filler, MD   DIAGNOSIS: The primary encounter diagnosis was Metastatic malignant neuroendocrine tumor to liver [facility]). A diagnosis of Secondary malignant neoplasm of bone [facility]) was also pertinent to this visit.   HISTORY OF PRESENT ILLNESS: Brian Mays is a 76 y.o. male s with a history of neuroendocrine tumor in 2014, originally in the small bowel.  He has had known metastatic disease and has received Y 90 intrahepatic therapy in November 2016, Lutathera injection x4 cycles in 2019 and has remained on Somatuline under the care of Dr. Marin Olp.  PET in October 2022 showed a lesion in T6 vertebral body concerning for further neuroendocrine tumor disease stable mild uptake in the T12 vertebral body was also appreciated and hepatic metastases and central mesenteric mass were also noted but stable.  An MRI of the thoracic spine on 09/14/2021 showed enhancing lesions at T6 and T12 consistent with metastatic disease but without pathologic fracture or compromise to his spinal canal or cord.  He went on to receive stereotactic radiosurgery to T6 and T12 and single fraction style therapy which she completed in January 2023.  His first posttreatment MRI of the spine on 02/04/2022 showed posttreatment effect and T6 and T12 but a 4 mm enhancing lesion in the T7 vertebral body that was felt to be new since prior imaging.  He had  a PET scan in April 2023 and there were no findings in the T7 location, and his most recent MRI of the thoracic spine on 05/06/22 showed stable changes in T6, persistent but no increase in the findings in T7, and improvement in T12, stable T5 hemangioma and no new or progressive disease was otherwise seen. He's contacted by phone to review this result.    PREVIOUS RADIATION THERAPY:     11/06/2021 through 11/06/2021 SRS Spine Site Technique Total Dose (Gy) Dose per Fx (Gy) Completed Fx Beam Energies  Thoracic Spine: Spine_T6 IMRT 18/18 18 1/1 6XFFF  Thoracic Spine: Spine_T12         Lutathera (Lu 177) injection on 02/01/2018 - s/p cycle 4  PAST MEDICAL HISTORY:  Past Medical History:  Diagnosis Date   Anemia    in past   Cataract    Colon polyps    Diverticulosis    Gallstones    GERD (gastroesophageal reflux disease)    Heart attack ([place])    mild, Spring 2017   Heart murmur    Hemorrhoid    Hiatal hernia    HTN (hypertension)    Hyperlipidemia    Hypothyroidism    Iron deficiency anemia due to chronic blood loss 03/29/2019   Iron malabsorption 03/29/2019   Metastatic carcinoma ([place]) 2010   Dr Jonette Eva   Neuroendocrine cancer [facility]) 08/2021   Pancreatitis 1998   chronic       PAST SURGICAL HISTORY: Past Surgical History:  Procedure Laterality Date  APPENDECTOMY  1962   CARDIAC CATHETERIZATION N/A 02/10/2016   Procedure: Left Heart Cath and Coronary Angiography;  Surgeon: Adrian Prows, MD;  Location: [place] [facility];  Service: Cardiovascular;  Laterality: N/A;   CARDIAC CATHETERIZATION  02/10/2016   Procedure: Coronary/Graft Atherectomy;  Surgeon: Adrian Prows, MD;  Location: [place] [facility];  Service: Cardiovascular;;   CARDIAC CATHETERIZATION  02/10/2016   Procedure: Coronary Stent Intervention;  Surgeon: Adrian Prows, MD;  Location: [place] [facility];  Service: Cardiovascular;;   CATARACT EXTRACTION W/ INTRAOCULAR LENS  IMPLANT, BILATERAL Bilateral    CORONARY STENT  INTERVENTION N/A 12/16/2021   Procedure: CORONARY STENT INTERVENTION;  Surgeon: Early Osmond, MD;  Location: [place] [facility];  Service: Cardiovascular;  Laterality: N/A;   IR GENERIC HISTORICAL  12/30/2015   IR RADIOLOGIST EVAL & MGMT 12/30/2015 Aletta Edouard, MD [facility] INTERV RAD   IR GENERIC HISTORICAL  11/09/2016   IR RADIOLOGIST EVAL & MGMT 11/09/2016 Aletta Edouard, MD [facility] INTERV RAD   IR RADIOLOGIST EVAL & MGMT  03/01/2017   IR RADIOLOGIST EVAL & MGMT  06/29/2017   LAPAROSCOPIC CHOLECYSTECTOMY  1999   LEFT HEART CATH AND CORONARY ANGIOGRAPHY N/A 12/16/2021   Procedure: LEFT HEART CATH AND CORONARY ANGIOGRAPHY;  Surgeon: Early Osmond, MD;  Location: [place] [facility];  Service: Cardiovascular;  Laterality: N/A;   LIVER BIOPSY  2010   RADIOACTIVE SEED IMPLANT  X 3   "to my liver"   TUMOR EXCISION  01/2009   Carcinoil Resection    TUMOR REMOVAL     from small intestine     FAMILY HISTORY:  Family History  Problem Relation Age of Onset   Kidney disease Mother    Hyperlipidemia Mother    Hypertension Mother    COPD Father    Ulcerative colitis Daughter    Colon cancer Neg Hx    Esophageal cancer Neg Hx    Rectal cancer Neg Hx    Stomach cancer Neg Hx      SOCIAL HISTORY:  reports that he quit smoking about 24 years ago. His smoking use included cigarettes. He started smoking about 62 years ago. He has a 52.50 pack-year smoking history. He has never used smokeless tobacco. He reports current alcohol use of about 3.0 - 4.0 standard drinks of alcohol per week. He reports that he does not use drugs. The patient is married and lives in [place]. His wife is also a patient at the cancer center with Dr. Isidore Moos and Dr. Mickeal Skinner, but is currently in hospice care.    ALLERGIES: Iohexol and Iodinated contrast media   MEDICATIONS:  Current Outpatient Medications  Medication Sig Dispense Refill   Artificial Tear Solution (SOOTHE XP OP) Place 1 drop into both eyes daily as needed  (dry eyes).     aspirin EC 81 MG tablet Take 81 mg by mouth every morning.     atorvastatin (LIPITOR) 80 MG tablet Take 80 mg by mouth daily.     Cholecalciferol 1000 UNITS tablet Take 1,000 Units by mouth 2 (two) times daily.     clopidogrel (PLAVIX) 75 MG tablet TAKE 1 TABLET BY MOUTH EVERY DAY 90 tablet 3   diphenhydramine-acetaminophen (TYLENOL PM) 25-500 MG TABS tablet Take 1 tablet by mouth at bedtime as needed (sleep/pain). (Patient not taking: Reported on 03/15/2022)     fluticasone (FLONASE) 50 MCG/ACT nasal spray Place 2 sprays into both nostrils daily as needed (sinuses).   11   hydrochlorothiazide (HYDRODIURIL) 25  MG tablet Take 0.5 tablets (12.5 mg total) by mouth daily. 45 tablet 3   hydrocortisone (ANUSOL-HC) 25 MG suppository INSERT 1 SUPPOSITORY RECTALLY TWICE A DAY AS NEEDED FOR HEMORRHOIDS 30 suppository 0   ipratropium (ATROVENT) 0.03 % nasal spray Place 2 sprays into the nose 3 (three) times daily. 30 mL 6   Lanreotide Acetate (SOMATULINE DEPOT [place]) Inject 120 mcg into the skin every 28 (twenty-eight) days. Receives at Dr Antonieta Pert office     lansoprazole (PREVACID) 30 MG capsule Take 1 capsule (30 mg total) by mouth daily at 12 noon. 90 capsule 3   levothyroxine (SYNTHROID) 50 MCG tablet TAKE 1 TABLET BY MOUTH EVERY DAY BEFORE BREAKFAST 90 tablet 0   losartan (COZAAR) 50 MG tablet Take 1 tablet (50 mg total) by mouth daily. 90 tablet 3   metoprolol tartrate (LOPRESSOR) 25 MG tablet Take 0.5 tablets (12.5 mg total) by mouth 2 (two) times daily. 90 tablet 3   nitroGLYCERIN (NITROSTAT) 0.4 MG SL tablet PLACE 1 TABLET UNDER THE TONGUE EVERY 5 (FIVE) MINUTES X 3 DOSES AS NEEDED FOR CHEST PAIN. (Patient not taking: Reported on 03/15/2022) 25 tablet 4   polyethylene glycol (MIRALAX / GLYCOLAX) 17 g packet Take 17 g by mouth daily.     ranolazine (RANEXA) 500 MG 12 hr tablet Take 500 mg by mouth 2 (two) times daily.     vitamin B-12 (CYANOCOBALAMIN) 1000 MCG tablet Take 1,000 mcg by  mouth every other day.     No current facility-administered medications for this encounter.     REVIEW OF SYSTEMS: On review of systems, the patient reports he is doing very well and denies any back pain or pain in the trunk. He is trying to navigate his wife's care as she is currently in hospice care for metastatic cancer. No other complaints are verbalized.      PHYSICAL EXAM:   Pain Assessment Pain Score: 0-No pain/10  Unable to assess given encounter type.  ECOG = 0  0 - Asymptomatic (Fully active, able to carry on all predisease activities without restriction)  1 - Symptomatic but completely ambulatory (Restricted in physically strenuous activity but ambulatory and able to carry out work of a light or sedentary nature. For example, light housework, office work)  2 - Symptomatic, <50% in bed during the day (Ambulatory and capable of all self care but unable to carry out any work activities. Up and about more than 50% of waking hours)  3 - Symptomatic, >50% in bed, but not bedbound (Capable of only limited self-care, confined to bed or chair 50% or more of waking hours)  4 - Bedbound (Completely disabled. Cannot carry on any self-care. Totally confined to bed or chair)  5 - Death   Eustace Pen MM, Creech RH, Tormey DC, et al. [phone]). "Toxicity and response criteria of the [facility]". [place] Oncol. 5 (6): 649-55    LABORATORY DATA:  Lab Results  Component Value Date   WBC 6.0 04/16/2022   HGB 11.8 (L) 04/16/2022   HCT 36.6 (L) 04/16/2022   MCV 93.8 04/16/2022   PLT 151 04/16/2022   Lab Results  Component Value Date   NA 140 04/16/2022   K 4.5 04/16/2022   CL 102 04/16/2022   CO2 32 04/16/2022   Lab Results  Component Value Date   ALT 12 04/16/2022   AST 20 04/16/2022   ALKPHOS 49 04/16/2022   BILITOT 0.7 04/16/2022      RADIOGRAPHY: MR  THORACIC SPINE W WO CONTRAST  Result Date: 05/07/2022 CLINICAL DATA:  Metastatic disease. EXAM:  MRI THORACIC WITHOUT AND WITH CONTRAST TECHNIQUE: Multiplanar and multiecho pulse sequences of the thoracic spine were obtained without and with intravenous contrast. CONTRAST:  8.70m GADAVIST GADOBUTROL 1 MMOL/ML IV SOLN COMPARISON:  PET-CT 02/11/2022 and prior MRI thoracic spine 02/04/2022 FINDINGS: Alignment:  Normal Vertebrae: Stable T5 hemangioma. Stable small enhancing lesion near the right pedicle of T6. Stable small enhancing left posterior element lesion also. Stable small enhancing lesion and T7. The T12 lesions are improved. Small foci of low T1 signal intensity persists but died no longer see any contrast enhancement. The vertebral body appears to have bright T1 signal intensity which could be related to radiation change. No new lesions are identified. Cord:  Normal cord signal intensity. No cord lesions or syrinx. Paraspinal and other soft tissues: No significant findings. Disc levels: No significant thoracic disc protrusions spinal or foraminal stenosis. IMPRESSION: 1. Stable small enhancing metastatic lesions involving T6 and T7. The T12 lesions appear improved without definite contrast enhancement on today's study. 2. Stable T5 hemangioma. 3. No new metastatic lesions are identified. 4. No significant thoracic disc protrusions or spinal stenosis. Electronically Signed   By: PMarijo SanesM.D.   On: 05/07/2022 13:30       IMPRESSION/PLAN: 1. Metastatic Neuroendocrine Tumor to Thoracic Spine. We discussed the patient's imaging results and stable findings from his thoracic MRI scan. We reviewed the recommendations of repeat MRI in about 3-4 months, and complete assessment at that time. He will continue Somatuline with Dr. EArelia Sneddonand let uKoreaknow if he has any other complaints or concerns.    This encounter was conducted via telephone.  The patient has provided two factor identification and has given verbal consent for this type of encounter and has been advised to only accept a meeting of this  type in a secure network environment. The time spent during this encounter was 35 minutes including preparation, discussion, and coordination of the patient's care. The attendants for this meeting include AHayden Pedro and MDorthula Perfect  During the encounter,  [facility]ayden Pedrowas [facility].  MDorthula Perfectwas located at home.        ACarola Rhine [facility]  **Disclaimer: This note was dictated with voice recognition software. Similar sounding words can inadvertently be transcribed and this note may contain transcription errors which may not have been corrected upon publication of note.**

## 2022-05-11 ENCOUNTER — Other Ambulatory Visit: Payer: Self-pay | Admitting: Radiation Therapy

## 2022-05-11 DIAGNOSIS — C7951 Secondary malignant neoplasm of bone: Secondary | ICD-10-CM

## 2022-05-13 ENCOUNTER — Inpatient Hospital Stay: Payer: Medicare HMO

## 2022-05-13 ENCOUNTER — Inpatient Hospital Stay: Payer: Medicare HMO | Admitting: Family

## 2022-05-13 ENCOUNTER — Encounter: Payer: Self-pay | Admitting: Family

## 2022-05-13 VITALS — BP 114/56 | HR 58 | Temp 98.1°F | Resp 16 | Ht 70.5 in | Wt 181.1 lb

## 2022-05-13 DIAGNOSIS — C7B8 Other secondary neuroendocrine tumors: Secondary | ICD-10-CM

## 2022-05-13 DIAGNOSIS — D500 Iron deficiency anemia secondary to blood loss (chronic): Secondary | ICD-10-CM

## 2022-05-13 DIAGNOSIS — D509 Iron deficiency anemia, unspecified: Secondary | ICD-10-CM | POA: Diagnosis not present

## 2022-05-13 DIAGNOSIS — C7A8 Other malignant neuroendocrine tumors: Secondary | ICD-10-CM | POA: Diagnosis present

## 2022-05-13 LAB — RETICULOCYTES
Immature Retic Fract: 8.3 % (ref 2.3–15.9)
RBC.: 3.96 MIL/uL — ABNORMAL LOW (ref 4.22–5.81)
Retic Count, Absolute: 34.5 10*3/uL (ref 19.0–186.0)
Retic Ct Pct: 0.9 % (ref 0.4–3.1)

## 2022-05-13 LAB — CBC WITH DIFFERENTIAL (CANCER CENTER ONLY)
Abs Immature Granulocytes: 0.08 10*3/uL — ABNORMAL HIGH (ref 0.00–0.07)
Basophils Absolute: 0 10*3/uL (ref 0.0–0.1)
Basophils Relative: 1 %
Eosinophils Absolute: 0.4 10*3/uL (ref 0.0–0.5)
Eosinophils Relative: 6 %
HCT: 37.2 % — ABNORMAL LOW (ref 39.0–52.0)
Hemoglobin: 12 g/dL — ABNORMAL LOW (ref 13.0–17.0)
Immature Granulocytes: 1 %
Lymphocytes Relative: 14 %
Lymphs Abs: 0.8 10*3/uL (ref 0.7–4.0)
MCH: 29.9 pg (ref 26.0–34.0)
MCHC: 32.3 g/dL (ref 30.0–36.0)
MCV: 92.8 fL (ref 80.0–100.0)
Monocytes Absolute: 0.4 10*3/uL (ref 0.1–1.0)
Monocytes Relative: 7 %
Neutro Abs: 4.2 10*3/uL (ref 1.7–7.7)
Neutrophils Relative %: 71 %
Platelet Count: 147 10*3/uL — ABNORMAL LOW (ref 150–400)
RBC: 4.01 MIL/uL — ABNORMAL LOW (ref 4.22–5.81)
RDW: 13.9 % (ref 11.5–15.5)
WBC Count: 5.9 10*3/uL (ref 4.0–10.5)
nRBC: 0 % (ref 0.0–0.2)

## 2022-05-13 LAB — CMP (CANCER CENTER ONLY)
ALT: 13 U/L (ref 0–44)
AST: 23 U/L (ref 15–41)
Albumin: 4.2 g/dL (ref 3.5–5.0)
Alkaline Phosphatase: 50 U/L (ref 38–126)
Anion gap: 5 (ref 5–15)
BUN: 18 mg/dL (ref 8–23)
CO2: 33 mmol/L — ABNORMAL HIGH (ref 22–32)
Calcium: 9.7 mg/dL (ref 8.9–10.3)
Chloride: 102 mmol/L (ref 98–111)
Creatinine: 1.22 mg/dL (ref 0.61–1.24)
GFR, Estimated: >60 mL/min (ref >60)
Glucose, Bld: 129 mg/dL — ABNORMAL HIGH (ref 70–99)
Potassium: 4.7 mmol/L (ref 3.5–5.1)
Sodium: 140 mmol/L (ref 135–145)
Total Bilirubin: 0.9 mg/dL (ref 0.3–1.2)
Total Protein: 7 g/dL (ref 6.5–8.1)

## 2022-05-13 LAB — IRON AND IRON BINDING CAPACITY (CC-WL,HP ONLY)
Iron: 66 ug/dL (ref 45–182)
Saturation Ratios: 22 % (ref 17.9–39.5)
TIBC: 302 ug/dL (ref 250–450)
UIBC: 236 ug/dL (ref 117–376)

## 2022-05-13 LAB — FERRITIN: Ferritin: 167 ng/mL (ref 24–336)

## 2022-05-13 MED ORDER — LANREOTIDE ACETATE 120 MG/0.5ML ~~LOC~~ SOLN
120.0000 mg | Freq: Once | SUBCUTANEOUS | Status: AC
  Administered 2022-05-13: 120 mg via SUBCUTANEOUS
  Filled 2022-05-13: qty 120

## 2022-05-13 NOTE — Patient Instructions (Signed)
[facility] [place]  Discharge Instructions: Thank you for choosing [facility] to provide your oncology and hematology care.   If you have a lab appointment with the [facility], please go directly to the [facility] and check in at the registration area.  Wear comfortable clothing and clothing appropriate for easy access to any Portacath or PICC line.   We strive to give you quality time with your provider. You may need to reschedule your appointment if you arrive late (15 or more minutes).  Arriving late affects you and other patients whose appointments are after yours.  Also, if you miss three or more appointments without notifying the office, you may be dismissed from the clinic at the provider's discretion.      For prescription refill requests, have your pharmacy contact our office and allow 72 hours for refills to be completed.    Today you received the following chemotherapy and/or immunotherapy agents Somatuline.   To help prevent nausea and vomiting after your treatment, we encourage you to take your nausea medication as directed.  BELOW ARE SYMPTOMS THAT SHOULD BE REPORTED IMMEDIATELY: *FEVER GREATER THAN 100.4 F (38 C) OR HIGHER *CHILLS OR SWEATING *NAUSEA AND VOMITING THAT IS NOT CONTROLLED WITH YOUR NAUSEA MEDICATION *UNUSUAL SHORTNESS OF BREATH *UNUSUAL BRUISING OR BLEEDING *URINARY PROBLEMS (pain or burning when urinating, or frequent urination) *BOWEL PROBLEMS (unusual diarrhea, constipation, pain near the anus) TENDERNESS IN MOUTH AND THROAT WITH OR WITHOUT PRESENCE OF ULCERS (sore throat, sores in mouth, or a toothache) UNUSUAL RASH, SWELLING OR PAIN  UNUSUAL VAGINAL DISCHARGE OR ITCHING   Items with * indicate a potential emergency and should be followed up as soon as possible or go to the Emergency Department if any problems should occur.  Please show the CHEMOTHERAPY ALERT CARD or IMMUNOTHERAPY ALERT CARD at check-in to the  Emergency Department and triage nurse. Should you have questions after your visit or need to cancel or reschedule your appointment, please contact [facility] [place]  [phone] and follow the prompts.  Office hours are 8:00 a.m. to 4:30 p.m. Monday - Friday. Please note that voicemails left after 4:00 p.m. may not be returned until the following business day.  We are closed weekends and major holidays. You have access to a nurse at all times for urgent questions. Please call the main number to the clinic [phone] and follow the prompts.  For any non-urgent questions, you may also contact your provider using MyChart. We now offer e-Visits for anyone 75 and older to request care online for non-urgent symptoms. For details visit [email or web address].   Also download the MyChart app! Go to the app store, search "MyChart", open the app, select [place], and log in with your MyChart username and password.  Masks are optional in the cancer centers. If you would like for your care team to wear a mask while they are taking care of you, please let them know. For doctor visits, patients may have with them one support person who is at least 75 years old. At this time, visitors are not allowed in the infusion area.

## 2022-05-13 NOTE — Progress Notes (Signed)
Hematology and Oncology Follow Up Visit  Brian Mays [id number] [birth year] 76 y.o. 05/13/2022   Principle Diagnosis:  Metastatic low grade neuroendocrine tumor-hepatic metastases Iron deficiency anemia   Past Therapy: S/p yttrium-90 intrahepatic therapy - November 2016 Lutathera (Lu 177) injection on 02/01/2018 - s/p cycle 4 SBRT to T6 lesion -- 11/06/2021   Current Therapy:        Somatuline 120 mg monthly IV Iron as indicated  --Injectafer given on 01/2021   Interim History:  Brian Mays is here today for follow-up and Somatuline injection. He is doing well and has no complaints at this time.  No blood loss, bruising or petechiae.  Chromogranin A level was 128 at his last visit.  No fever, chills, n/v, cough, rash, dizziness, SOB, chest pain, palpitations, abdominal pain or changes in bowel or bladder habits.  MRI last week showed stable metastatic disease at T6 and T7.  No swelling, tenderness, numbness or tingling in her extremities.  No falls or syncope.  Appetite and hydration are good and he is staying active walking daily. He is eating healthier to help lose weight. Weight is 181 lbs.   ECOG Performance Status: 1 - Symptomatic but completely ambulatory  Medications:  Allergies as of 05/13/2022       Reactions   Iohexol Hives    Code: HIVES, Desc: PER [name] @ PRIMARY CARE, PT IS ALLERGIC TO CONTRAST DYE 10/02/08/RM  05/01/10...needs full premeds per our protocol w/ [facility]., Onset Date: [id number]   Iodinated Contrast Media Hives        Medication List        Accurate as of May 13, 2022  9:49 AM. If you have any questions, ask your nurse or doctor.          aspirin EC 81 MG tablet Take 81 mg by mouth every morning.   atorvastatin 80 MG tablet Commonly known as: LIPITOR Take 80 mg by mouth daily.   Cholecalciferol 25 MCG (1000 UT) tablet Take 1,000 Units by mouth 2 (two) times daily.   clopidogrel 75 MG tablet Commonly known as: PLAVIX TAKE 1 TABLET  BY MOUTH EVERY DAY   diphenhydramine-acetaminophen 25-500 MG Tabs tablet Commonly known as: TYLENOL PM Take 1 tablet by mouth at bedtime as needed (sleep/pain).   fluticasone 50 MCG/ACT nasal spray Commonly known as: FLONASE Place 2 sprays into both nostrils daily as needed (sinuses).   hydrochlorothiazide 25 MG tablet Commonly known as: HYDRODIURIL Take 0.5 tablets (12.5 mg total) by mouth daily.   hydrocortisone 25 MG suppository Commonly known as: ANUSOL-HC INSERT 1 SUPPOSITORY RECTALLY TWICE A DAY AS NEEDED FOR HEMORRHOIDS   ipratropium 0.03 % nasal spray Commonly known as: Atrovent Place 2 sprays into the nose 3 (three) times daily.   lansoprazole 30 MG capsule Commonly known as: PREVACID Take 1 capsule (30 mg total) by mouth daily at 12 noon.   levothyroxine 50 MCG tablet Commonly known as: SYNTHROID TAKE 1 TABLET BY MOUTH EVERY DAY BEFORE BREAKFAST   losartan 50 MG tablet Commonly known as: COZAAR Take 1 tablet (50 mg total) by mouth daily.   metoprolol tartrate 25 MG tablet Commonly known as: LOPRESSOR Take 0.5 tablets (12.5 mg total) by mouth 2 (two) times daily.   nitroGLYCERIN 0.4 MG SL tablet Commonly known as: NITROSTAT PLACE 1 TABLET UNDER THE TONGUE EVERY 5 (FIVE) MINUTES X 3 DOSES AS NEEDED FOR CHEST PAIN.   polyethylene glycol 17 g packet Commonly known as: MIRALAX / GLYCOLAX Take 17  g by mouth daily.   ranolazine 500 MG 12 hr tablet Commonly known as: RANEXA Take 500 mg by mouth 2 (two) times daily.   SOMATULINE DEPOT [place] Inject 120 mcg into the skin every 28 (twenty-eight) days. Receives at Dr Antonieta Pert office   SOOTHE XP OP Place 1 drop into both eyes daily as needed (dry eyes).   vitamin B-12 1000 MCG tablet Commonly known as: CYANOCOBALAMIN Take 1,000 mcg by mouth every other day.        Allergies:  Allergies  Allergen Reactions   Iohexol Hives     Code: HIVES, Desc: PER [name] @ PRIMARY CARE, PT IS ALLERGIC TO CONTRAST DYE  10/02/08/RM  05/01/10...needs full premeds per our protocol w/ [facility] imaging., Onset Date: [id number]    Iodinated Contrast Media Hives    Past Medical History, Surgical history, Social history, and Family History were reviewed and updated.  Review of Systems: All other 10 point review of systems is negative.   Physical Exam:  height is 5' 10.5" (1.791 m) and weight is 181 lb 1.6 oz (82.1 kg). His oral temperature is 98.1 F (36.7 C). His blood pressure is 114/56 (abnormal) and his pulse is 58 (abnormal). His respiration is 16 and oxygen saturation is 99%.   Wt Readings from Last 3 Encounters:  05/13/22 181 lb 1.6 oz (82.1 kg)  04/16/22 186 lb (84.4 kg)  03/15/22 189 lb 0.6 oz (85.7 kg)    Ocular: Sclerae unicteric, pupils equal, round and reactive to light Ear-nose-throat: Oropharynx clear, dentition fair Lymphatic: No cervical or supraclavicular adenopathy Lungs no rales or rhonchi, good excursion bilaterally Heart regular rate and rhythm, no murmur appreciated Abd soft, nontender, positive bowel sounds MSK no focal spinal tenderness, no joint edema Neuro: non-focal, well-oriented, appropriate affect Breasts: Deferred   Lab Results  Component Value Date   WBC 5.9 05/13/2022   HGB 12.0 (L) 05/13/2022   HCT 37.2 (L) 05/13/2022   MCV 92.8 05/13/2022   PLT 147 (L) 05/13/2022   Lab Results  Component Value Date   FERRITIN 118 04/16/2022   IRON 61 04/16/2022   TIBC 308 04/16/2022   UIBC 247 04/16/2022   IRONPCTSAT 20 04/16/2022   Lab Results  Component Value Date   RETICCTPCT 0.9 05/13/2022   RBC 4.01 (L) 05/13/2022   RBC 3.96 (L) 05/13/2022   No results found for: "KPAFRELGTCHN", "LAMBDASER", "KAPLAMBRATIO" No results found for: "IGGSERUM", "IGA", "IGMSERUM" No results found for: "TOTALPROTELP", "ALBUMINELP", "A1GS", "A2GS", "BETS", "BETA2SER", "GAMS", "MSPIKE", "SPEI"   Chemistry      Component Value Date/Time   NA 140 05/13/2022 0826   NA 139 12/11/2021 1221   NA  144 10/04/2017 0904   NA 141 07/23/2016 1255   K 4.7 05/13/2022 0826   K 3.8 10/04/2017 0904   K 4.1 07/23/2016 1255   CL 102 05/13/2022 0826   CL 103 10/04/2017 0904   CO2 33 (H) 05/13/2022 0826   CO2 28 10/04/2017 0904   CO2 27 07/23/2016 1255   BUN 18 05/13/2022 0826   BUN 13 12/11/2021 1221   BUN 14 10/04/2017 0904   BUN 13.8 07/23/2016 1255   CREATININE 1.22 05/13/2022 0826   CREATININE 0.8 10/04/2017 0904   CREATININE 1.0 07/23/2016 1255      Component Value Date/Time   CALCIUM 9.7 05/13/2022 0826   CALCIUM 9.4 10/04/2017 0904   CALCIUM 9.2 07/23/2016 1255   ALKPHOS 50 05/13/2022 0826   ALKPHOS 64 10/04/2017 0904   ALKPHOS 77 07/23/2016  1255   AST 23 05/13/2022 0826   AST 23 07/23/2016 1255   ALT 13 05/13/2022 0826   ALT 22 10/04/2017 0904   ALT 13 07/23/2016 1255   BILITOT 0.9 05/13/2022 0826   BILITOT 1.13 07/23/2016 1255       Impression and Plan: Brian Mays is a very pleasant 76 yo caucasian gentleman with metastatic low-grade neuroendocrine carcinoma with hepatic metastasis.   Chromogranin A pending.  Somatuline given today as planned.  Follow-up in 1 month.   Lottie Dawson, NP 7/13/20239:49 AM

## 2022-05-14 ENCOUNTER — Telehealth: Payer: Self-pay

## 2022-05-14 NOTE — Telephone Encounter (Signed)
-----   Message from Volanda Napoleon, MD sent at 05/13/2022  8:51 PM EDT ----- Call - the iron level is ok!!!  Brian Mays

## 2022-05-14 NOTE — Telephone Encounter (Signed)
Left voicemail informing patient that his iron level is ok and no need for any infusions. Phone number for call back provided if patient has any questions.

## 2022-05-17 ENCOUNTER — Ambulatory Visit: Payer: Medicare HMO | Admitting: Family

## 2022-05-17 ENCOUNTER — Ambulatory Visit: Payer: Medicare HMO

## 2022-05-17 ENCOUNTER — Other Ambulatory Visit: Payer: Medicare HMO

## 2022-05-17 LAB — CHROMOGRANIN A: Chromogranin A (ng/mL): 183.4 ng/mL — ABNORMAL HIGH (ref 0.0–101.8)

## 2022-05-19 DIAGNOSIS — B078 Other viral warts: Secondary | ICD-10-CM | POA: Diagnosis not present

## 2022-05-19 DIAGNOSIS — W57XXXA Bitten or stung by nonvenomous insect and other nonvenomous arthropods, initial encounter: Secondary | ICD-10-CM | POA: Diagnosis not present

## 2022-05-19 DIAGNOSIS — R238 Other skin changes: Secondary | ICD-10-CM | POA: Diagnosis not present

## 2022-05-25 ENCOUNTER — Other Ambulatory Visit: Payer: Self-pay | Admitting: Family Medicine

## 2022-05-25 DIAGNOSIS — E039 Hypothyroidism, unspecified: Secondary | ICD-10-CM

## 2022-05-25 MED ORDER — LEVOTHYROXINE SODIUM 50 MCG PO TABS: 50.0000 ug | ORAL_TABLET | Freq: Every day | ORAL | 0 refills | Status: DC

## 2022-06-07 ENCOUNTER — Ambulatory Visit (HOSPITAL_COMMUNITY): Payer: Medicare HMO

## 2022-06-14 ENCOUNTER — Telehealth: Payer: Self-pay | Admitting: *Deleted

## 2022-06-14 ENCOUNTER — Inpatient Hospital Stay: Payer: Medicare HMO | Attending: Hematology & Oncology

## 2022-06-14 ENCOUNTER — Encounter: Payer: Self-pay | Admitting: Family

## 2022-06-14 ENCOUNTER — Inpatient Hospital Stay: Payer: Medicare HMO

## 2022-06-14 ENCOUNTER — Inpatient Hospital Stay: Payer: Medicare HMO | Admitting: Family

## 2022-06-14 VITALS — BP 123/62 | HR 59 | Temp 97.7°F | Resp 18 | Wt 183.0 lb

## 2022-06-14 DIAGNOSIS — C7A8 Other malignant neuroendocrine tumors: Secondary | ICD-10-CM | POA: Insufficient documentation

## 2022-06-14 DIAGNOSIS — C7B8 Other secondary neuroendocrine tumors: Secondary | ICD-10-CM

## 2022-06-14 DIAGNOSIS — D500 Iron deficiency anemia secondary to blood loss (chronic): Secondary | ICD-10-CM

## 2022-06-14 LAB — CMP (CANCER CENTER ONLY)
ALT: 12 U/L (ref 0–44)
AST: 18 U/L (ref 15–41)
Albumin: 4 g/dL (ref 3.5–5.0)
Alkaline Phosphatase: 52 U/L (ref 38–126)
Anion gap: 7 (ref 5–15)
BUN: 17 mg/dL (ref 8–23)
CO2: 32 mmol/L (ref 22–32)
Calcium: 9.6 mg/dL (ref 8.9–10.3)
Chloride: 104 mmol/L (ref 98–111)
Creatinine: 1.13 mg/dL (ref 0.61–1.24)
GFR, Estimated: >60 mL/min (ref >60)
Glucose, Bld: 160 mg/dL — ABNORMAL HIGH (ref 70–99)
Potassium: 4.2 mmol/L (ref 3.5–5.1)
Sodium: 143 mmol/L (ref 135–145)
Total Bilirubin: 0.8 mg/dL (ref 0.3–1.2)
Total Protein: 6.4 g/dL — ABNORMAL LOW (ref 6.5–8.1)

## 2022-06-14 LAB — CBC WITH DIFFERENTIAL (CANCER CENTER ONLY)
Abs Immature Granulocytes: 0.08 10*3/uL — ABNORMAL HIGH (ref 0.00–0.07)
Basophils Absolute: 0 10*3/uL (ref 0.0–0.1)
Basophils Relative: 1 %
Eosinophils Absolute: 0.3 10*3/uL (ref 0.0–0.5)
Eosinophils Relative: 6 %
HCT: 37.2 % — ABNORMAL LOW (ref 39.0–52.0)
Hemoglobin: 11.8 g/dL — ABNORMAL LOW (ref 13.0–17.0)
Immature Granulocytes: 1 %
Lymphocytes Relative: 10 %
Lymphs Abs: 0.6 10*3/uL — ABNORMAL LOW (ref 0.7–4.0)
MCH: 29.7 pg (ref 26.0–34.0)
MCHC: 31.7 g/dL (ref 30.0–36.0)
MCV: 93.7 fL (ref 80.0–100.0)
Monocytes Absolute: 0.3 10*3/uL (ref 0.1–1.0)
Monocytes Relative: 6 %
Neutro Abs: 4.3 10*3/uL (ref 1.7–7.7)
Neutrophils Relative %: 76 %
Platelet Count: 133 10*3/uL — ABNORMAL LOW (ref 150–400)
RBC: 3.97 MIL/uL — ABNORMAL LOW (ref 4.22–5.81)
RDW: 14.2 % (ref 11.5–15.5)
WBC Count: 5.6 10*3/uL (ref 4.0–10.5)
nRBC: 0 % (ref 0.0–0.2)

## 2022-06-14 LAB — LACTATE DEHYDROGENASE: LDH: 174 U/L (ref 98–192)

## 2022-06-14 LAB — RETICULOCYTES
Immature Retic Fract: 11.6 % (ref 2.3–15.9)
RBC.: 3.96 MIL/uL — ABNORMAL LOW (ref 4.22–5.81)
Retic Count, Absolute: 47.5 10*3/uL (ref 19.0–186.0)
Retic Ct Pct: 1.2 % (ref 0.4–3.1)

## 2022-06-14 LAB — IRON AND IRON BINDING CAPACITY (CC-WL,HP ONLY)
Iron: 60 ug/dL (ref 45–182)
Saturation Ratios: 21 % (ref 17.9–39.5)
TIBC: 280 ug/dL (ref 250–450)
UIBC: 220 ug/dL (ref 117–376)

## 2022-06-14 LAB — FERRITIN: Ferritin: 113 ng/mL (ref 24–336)

## 2022-06-14 MED ORDER — LANREOTIDE ACETATE 120 MG/0.5ML ~~LOC~~ SOLN
120.0000 mg | Freq: Once | SUBCUTANEOUS | Status: AC
  Administered 2022-06-14: 120 mg via SUBCUTANEOUS
  Filled 2022-06-14: qty 120

## 2022-06-14 NOTE — Patient Instructions (Signed)
Brian Mays AT [facility]  Discharge Instructions: Thank you for choosing [facility] to provide your oncology and hematology care.   If you have a lab appointment with the [facility], please go directly to the [facility] and check in at the registration area.  Wear comfortable clothing and clothing appropriate for easy access to any Portacath or PICC line.   We strive to give you quality time with your provider. You may need to reschedule your appointment if you arrive late (15 or more minutes).  Arriving late affects you and other patients whose appointments are after yours.  Also, if you miss three or more appointments without notifying the office, you may be dismissed from the clinic at the provider's discretion.      For prescription refill requests, have your pharmacy contact our office and allow 72 hours for refills to be completed.    Today you received the following chemotherapy and/or immunotherapy agents somatuline      To help prevent nausea and vomiting after your treatment, we encourage you to take your nausea medication as directed.  BELOW ARE SYMPTOMS THAT SHOULD BE REPORTED IMMEDIATELY: *FEVER GREATER THAN 100.4 F (38 C) OR HIGHER *CHILLS OR SWEATING *NAUSEA AND VOMITING THAT IS NOT CONTROLLED WITH YOUR NAUSEA MEDICATION *UNUSUAL SHORTNESS OF BREATH *UNUSUAL BRUISING OR BLEEDING *URINARY PROBLEMS (pain or burning when urinating, or frequent urination) *BOWEL PROBLEMS (unusual diarrhea, constipation, pain near the anus) TENDERNESS IN MOUTH AND THROAT WITH OR WITHOUT PRESENCE OF ULCERS (sore throat, sores in mouth, or a toothache) UNUSUAL RASH, SWELLING OR PAIN  UNUSUAL VAGINAL DISCHARGE OR ITCHING   Items with * indicate a potential emergency and should be followed up as soon as possible or go to the Emergency Department if any problems should occur.  Please show the CHEMOTHERAPY ALERT CARD or IMMUNOTHERAPY ALERT CARD at check-in to the  Emergency Department and triage nurse. Should you have questions after your visit or need to cancel or reschedule your appointment, please contact [facility]  [phone] and follow the prompts.  Office hours are 8:00 a.m. to 4:30 p.m. Monday - Friday. Please note that voicemails left after 4:00 p.m. may not be returned until the following business day.  We are closed weekends and major holidays. You have access to a nurse at all times for urgent questions. Please call the main number to the clinic [phone] and follow the prompts.  For any non-urgent questions, you may also contact your provider using MyChart. We now offer e-Visits for anyone 63 and older to request care online for non-urgent symptoms. For details visit [email or web address].   Also download the MyChart app! Go to the app store, search "MyChart", open the app, select [facility], and log in with your MyChart username and password.  Masks are optional in the cancer centers. If you would like for your care team to wear a mask while they are taking care of you, please let them know. You may have one support person who is at least 75 years old accompany you for your appointments.

## 2022-06-14 NOTE — Telephone Encounter (Signed)
Per 06/14/22 los - called and gave upcoming appointments - confirmed

## 2022-06-14 NOTE — Progress Notes (Signed)
Hematology and Oncology Follow Up Visit  Brian Mays [id number] Mar 31, 1946 76 y.o. 06/14/2022   Principle Diagnosis:  Metastatic low grade neuroendocrine tumor-hepatic metastases Iron deficiency anemia   Past Therapy: S/p [id number] intrahepatic therapy - November 2016 Lutathera (Lu 177) injection on 02/01/2018 - s/p cycle 4 SBRT to T6 lesion -- 11/06/2021   Current Therapy:        Somatuline 120 mg monthly IV Iron as indicated  --Injectafer given on 01/2021   Interim History:  Brian Mays is here today for follow-up and treatment. He is doing well but going through a tough time at home. His wife has brain cancer and now is home with hospice. His daughter has been a great help to him.  He has had no pain. He does note some fatigue.  Chromogranin A last visit was 183. Today's result is pending.  No fever, chills, n/v, cough, rash, dizziness, SOB, chest pain, palpitations, abdominal pain or changes in bowel or bladder habits.  No swelling, tenderness, numbness or tingling in his extremities.  He is taking long walks for exercise.  No falls or syncope reported.  His weight is stable at 183 lbs. Appetite and hydration are good.    ECOG Performance Status: 1 - Symptomatic but completely ambulatory  Medications:  Allergies as of 06/14/2022       Reactions   Iohexol Hives    Code: HIVES, Desc: PER [name] @ [facility], PT IS ALLERGIC TO CONTRAST DYE 10/02/08/RM  05/01/10...needs full premeds per our protocol w/ [facility]., Onset Date: [id number]   Iodinated Contrast Media Hives        Medication List        Accurate as of June 14, 2022  8:47 AM. If you have any questions, ask your nurse or doctor.          aspirin EC 81 MG tablet Take 81 mg by mouth every morning.   atorvastatin 80 MG tablet Commonly known as: LIPITOR Take 80 mg by mouth daily.   Cholecalciferol 25 MCG (1000 UT) tablet Take 1,000 Units by mouth 2 (two) times daily.   clopidogrel 75 MG  tablet Commonly known as: PLAVIX TAKE 1 TABLET BY MOUTH EVERY DAY   cyanocobalamin 1000 MCG tablet Commonly known as: VITAMIN B12 Take 1,000 mcg by mouth every other day.   diphenhydramine-acetaminophen 25-500 MG Tabs tablet Commonly known as: TYLENOL PM Take 1 tablet by mouth at bedtime as needed (sleep/pain).   fluticasone 50 MCG/ACT nasal spray Commonly known as: FLONASE Place 2 sprays into both nostrils daily as needed (sinuses).   hydrochlorothiazide 25 MG tablet Commonly known as: HYDRODIURIL Take 0.5 tablets (12.5 mg total) by mouth daily.   hydrocortisone 25 MG suppository Commonly known as: ANUSOL-HC INSERT 1 SUPPOSITORY RECTALLY TWICE A DAY AS NEEDED FOR HEMORRHOIDS   ipratropium 0.03 % nasal spray Commonly known as: Atrovent Place 2 sprays into the nose 3 (three) times daily.   lansoprazole 30 MG capsule Commonly known as: PREVACID Take 1 capsule (30 mg total) by mouth daily at 12 noon.   levothyroxine 50 MCG tablet Commonly known as: SYNTHROID Take 1 tablet (50 mcg total) by mouth daily before breakfast.   losartan 50 MG tablet Commonly known as: COZAAR Take 1 tablet (50 mg total) by mouth daily.   metoprolol tartrate 25 MG tablet Commonly known as: LOPRESSOR Take 0.5 tablets (12.5 mg total) by mouth 2 (two) times daily.   nitroGLYCERIN 0.4 MG SL tablet Commonly known as: NITROSTAT PLACE  1 TABLET UNDER THE TONGUE EVERY 5 (FIVE) MINUTES X 3 DOSES AS NEEDED FOR CHEST PAIN.   polyethylene glycol 17 g packet Commonly known as: MIRALAX / GLYCOLAX Take 17 g by mouth daily.   ranolazine 500 MG 12 hr tablet Commonly known as: RANEXA Take 500 mg by mouth 2 (two) times daily.   SOMATULINE DEPOT [place] Inject 120 mcg into the skin every 28 (twenty-eight) days. Receives at Dr Antonieta Pert office   SOOTHE XP OP Place 1 drop into both eyes daily as needed (dry eyes).        Allergies:  Allergies  Allergen Reactions   Iohexol Hives     Code: HIVES, Desc:  PER [name] @ [facility], PT IS ALLERGIC TO CONTRAST DYE 10/02/08/RM  05/01/10...needs full premeds per our protocol w/ [facility]., Onset Date: [id number]    Iodinated Contrast Media Hives    Past Medical History, Surgical history, Social history, and Family History were reviewed and updated.  Review of Systems: All other 10 point review of systems is negative.   Physical Exam:  weight is 183 lb (83 kg). His oral temperature is 97.7 F (36.5 C). His blood pressure is 123/62 and his pulse is 59 (abnormal). His respiration is 18 and oxygen saturation is 100%.   Wt Readings from Last 3 Encounters:  06/14/22 183 lb (83 kg)  05/13/22 181 lb 1.6 oz (82.1 kg)  04/16/22 186 lb (84.4 kg)    Ocular: Sclerae unicteric, pupils equal, round and reactive to light Ear-nose-throat: Oropharynx clear, dentition fair Lymphatic: No cervical or supraclavicular adenopathy Lungs no rales or rhonchi, good excursion bilaterally Heart regular rate and rhythm, no murmur appreciated Abd soft, nontender, positive bowel sounds MSK no focal spinal tenderness, no joint edema Neuro: non-focal, well-oriented, appropriate affect Breasts: Deferred   Lab Results  Component Value Date   WBC 5.6 06/14/2022   HGB 11.8 (L) 06/14/2022   HCT 37.2 (L) 06/14/2022   MCV 93.7 06/14/2022   PLT 133 (L) 06/14/2022   Lab Results  Component Value Date   FERRITIN 167 05/13/2022   IRON 66 05/13/2022   TIBC 302 05/13/2022   UIBC 236 05/13/2022   IRONPCTSAT 22 05/13/2022   Lab Results  Component Value Date   RETICCTPCT 1.2 06/14/2022   RBC 3.96 (L) 06/14/2022   No results found for: "KPAFRELGTCHN", "LAMBDASER", "KAPLAMBRATIO" No results found for: "IGGSERUM", "IGA", "IGMSERUM" No results found for: "TOTALPROTELP", "ALBUMINELP", "A1GS", "A2GS", "BETS", "BETA2SER", "GAMS", "MSPIKE", "SPEI"   Chemistry      Component Value Date/Time   NA 140 05/13/2022 0826   NA 139 12/11/2021 1221   NA 144 10/04/2017 0904   NA 141  07/23/2016 1255   K 4.7 05/13/2022 0826   K 3.8 10/04/2017 0904   K 4.1 07/23/2016 1255   CL 102 05/13/2022 0826   CL 103 10/04/2017 0904   CO2 33 (H) 05/13/2022 0826   CO2 28 10/04/2017 0904   CO2 27 07/23/2016 1255   BUN 18 05/13/2022 0826   BUN 13 12/11/2021 1221   BUN 14 10/04/2017 0904   BUN 13.8 07/23/2016 1255   CREATININE 1.22 05/13/2022 0826   CREATININE 0.8 10/04/2017 0904   CREATININE 1.0 07/23/2016 1255      Component Value Date/Time   CALCIUM 9.7 05/13/2022 0826   CALCIUM 9.4 10/04/2017 0904   CALCIUM 9.2 07/23/2016 1255   ALKPHOS 50 05/13/2022 0826   ALKPHOS 64 10/04/2017 0904   ALKPHOS 77 07/23/2016 1255   AST 23  05/13/2022 0826   AST 23 07/23/2016 1255   ALT 13 05/13/2022 0826   ALT 22 10/04/2017 0904   ALT 13 07/23/2016 1255   BILITOT 0.9 05/13/2022 0826   BILITOT 1.13 07/23/2016 1255       Impression and Plan: Mr. Remo is a very pleasant 76 yo caucasian gentleman with metastatic low-grade neuroendocrine carcinoma with hepatic metastasis.   Chromagranin A pending.  Somatuline given today as planned.  Follow-up in 1 month.   Lottie Dawson, NP 8/14/20238:47 AM

## 2022-06-15 LAB — CHROMOGRANIN A: Chromogranin A (ng/mL): 144.5 ng/mL — ABNORMAL HIGH (ref 0.0–101.8)

## 2022-06-21 ENCOUNTER — Encounter: Payer: Self-pay | Admitting: Cardiovascular Disease

## 2022-06-21 NOTE — Progress Notes (Unsigned)
Patient ID: CRUZ BONG; [id number]; 09-06-1946   Admit date: (Not on file) Date of Consult: 06/22/2022  Primary Care Provider: Darreld Mclean, MD Primary Cardiologist:  Levin Dagostino  Primary Electrophysiologist:     Problem list 1.  Coronary artery disease: Status post rotational atherectomy and stenting of the RCA 2.  Hypertension 3.  Hyperlipidemia 4.  Hypothyroidism  Patient Profile:   Brian Mays is a 76 y.o. male with a hx of HTN, , coronary artery disease with a recent episode of weakness and near syncope.  who is being seen today for the evaluation of HTN  at the request of Dr. Marin Olp. He sees Dr. Marin Olp for Carcinoid       Brian Mays is seen wife wife , Brian Mays today   Hx of coronary stenting in March 2017 .   Was started on Metoprolol, he has had intermittent weakness and dizziness/lightheadedness since that time.  He eats and drinks regularly.   Generally gets some regular exercise.  Has had some back issues recently.  He still goes to the line does some exercises that do not cause further back pain.  Dr. Marin Olp asked him to reduce his BP meds.   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  [facility] . Does have some lightheadedness after working out .  No CP or dyspnea while working out.   No synmptoms similar to his MI.    He stopped the Losartan last week He decreased the metoprolol to 12. 5 mg BID last week.   Still eats salty foods. Wife cooks from [employer] account [employer] .  Eats some pretzels.   Has flushing related to Carcinoid.    Typically occurs if he drinks some beer .   Feb.  12, 2019: Doing well Gaining some weight .   Gave him the OK to walk  No CP or dyspnea   Aug. 27 ,2019:  Doing well.   Feeling better since he decreased his metoprolol in half.   September 15, 2018: Seen back today for follow-up of his coronary artery disease, hypertension,  hyperlipidemia. He increase his losartan 3 weeks ago. No CP  BP readings at home have been elevated.  Is not exercising   August 06, 2019:  Brian Mays is seen back today for follow-up of his coronary artery disease, hypertension, hyperlipidemia. BP has been well controlled at home .  No CP , is active , not doing exercise per se Tries to avoid salty foods.   April 22, 2020:  Brian Mays is seen today for follow up for his CAD, HTN, and hyperlipidemia. Several weeks ago he developed some light headedness.  Was helping a customer unload paint.  Was bending over - up and down repeatedly  Symptoms ~ c/w orthostasis   Went to the ER  Denies any chest pain .   Associated with some fatigue.  Had not skipped any meals,  May have been a bit volume depleted. ( had walked 5 miles the day before )  Labs looked ok .   Troponin were negative x 2.  ECG was normal   Dec. 3, 2021: Brian Mays is seen back today for follow up of his CAD, HTN, HLD Had some orthostasis prior to his previous visit  We reduced  his HCTZ at his last visit  Still eats salty foods regularly  Is not exercising  regularly .   Dec. 2, 2022 Brian Mays is seen back for follow up of his CAD, HTN, HLD No cp Is not as active as he used to be Is having to take care of his wife who had brain cancer surgery  She will need radiation  Has had his 2 covid vaccines and 2 boosters.  Has not had had covid  Wants to get back out on his mountain bike - ive encouraged him to ride / exercise regularly   Feb. 27, 2023 Brian Mays is here to follow up with CAD, HTN, HLD  He here with some anginal-like symptoms in December.  Heart catheterization revealed a mid RCA stenosis of 99%.  This lesion was stented.  He has mild LAD disease. His belching has resolved.   LDL was 44 in Nov. 2023  Had been started on Imdur prior to his procedure.  OK to stop imdur  February 26, 2022:  Had stenting several months ago  He had acute dizziness and some belching  (which is his anginal equivalent).  While driving.  He was admitted to a [facility] hospital.  Heart catheterization revealed a tight in-stent restenosis.  He had repeat PCI of an existing stent.   Conclusion: 1. Successful intervention with intravascular ultrasound to the proximal and mid right coronary artery using balloon angioplasty with 3.0 x 12, 3.5 x 20 and 4.0 x 12 mm Valders Quantum balloons. Preintervention stenosis 90%, postintervention stenosis 20%.  Recommendations: Dual anti-platelet therapy. High intensity statin therapy. Continue risk factor modifications. Add Ranexa, if he continues to have symptoms can consider shockwave therapy to the mid RCA   He still had some episodes of lightheadedness.  He reduced his metoprolol to one half and that seems to have helped with his lightheadedness.  He is having to do all the driving and his family since his wife has a brain tumor.   Aug. 22, 2023  Brian Mays is seen for follow up of his CAD , He is not having any angina symptoms  Was having lots of belching  Is exercising / walking quite a bit , is not having any angina  Has lots 28 lbs since last year  Labs drawn today  Past Medical History:  Diagnosis Date   Anemia    in past   Cataract    Colon polyps    Diverticulosis    Gallstones    GERD (gastroesophageal reflux disease)    Heart attack ([place])    mild, Spring 2017   Heart murmur    Hemorrhoid    Hiatal hernia    HTN (hypertension)    Hyperlipidemia    Hypothyroidism    Iron deficiency anemia due to chronic blood loss 03/29/2019   Iron malabsorption 03/29/2019   Metastatic carcinoma ([place]) 2010   Dr Jonette Eva   Neuroendocrine cancer [facility]) 08/2021   Pancreatitis 1998   chronic    Past Surgical History:  Procedure Laterality Date   APPENDECTOMY  1962   CARDIAC CATHETERIZATION N/A 02/10/2016   Procedure: Left Heart Cath and Coronary Angiography;  Surgeon: Adrian Prows, MD;  Location: [place] [facility];  Service:  Cardiovascular;  Laterality: N/A;   CARDIAC CATHETERIZATION  02/10/2016   Procedure: Coronary/Graft Atherectomy;  Surgeon: Adrian Prows, MD;  Location: [place] [facility];  Service: Cardiovascular;;   CARDIAC CATHETERIZATION  02/10/2016   Procedure: Coronary Stent Intervention;  Surgeon: Adrian Prows, MD;  Location: [place] [facility];  Service: Cardiovascular;;   CATARACT EXTRACTION W/ INTRAOCULAR LENS  IMPLANT, BILATERAL Bilateral    CORONARY STENT INTERVENTION N/A 12/16/2021   Procedure: CORONARY STENT INTERVENTION;  Surgeon: Early Osmond, MD;  Location: [place] [facility];  Service: Cardiovascular;  Laterality: N/A;   IR GENERIC HISTORICAL  12/30/2015   IR RADIOLOGIST EVAL & MGMT 12/30/2015 Aletta Edouard, MD [facility] INTERV RAD   IR GENERIC HISTORICAL  11/09/2016   IR RADIOLOGIST EVAL & MGMT 11/09/2016 Aletta Edouard, MD [facility] INTERV RAD   IR RADIOLOGIST EVAL & MGMT  03/01/2017   IR RADIOLOGIST EVAL & MGMT  06/29/2017   LAPAROSCOPIC CHOLECYSTECTOMY  1999   LEFT HEART CATH AND CORONARY ANGIOGRAPHY N/A 12/16/2021   Procedure: LEFT HEART CATH AND CORONARY ANGIOGRAPHY;  Surgeon: Early Osmond, MD;  Location: [place] [facility];  Service: Cardiovascular;  Laterality: N/A;   LIVER BIOPSY  2010   RADIOACTIVE SEED IMPLANT  X 3   "to my liver"   TUMOR EXCISION  01/2009   Carcinoil Resection    TUMOR REMOVAL     from small intestine     Home Medications:  Prior to Admission medications   Medication Sig Start Date End Date Taking? Authorizing Provider  amLODipine (NORVASC) 5 MG tablet Take 5 mg by mouth daily. 03/16/17  Yes [provider]  aspirin EC 81 MG tablet Take 81 mg by mouth every morning.   Yes [provider]  atorvastatin (LIPITOR) 20 MG tablet Take 20 mg daily by mouth.   Yes [provider]  Cholecalciferol 1000 UNITS tablet Take 1,000 Units by mouth daily.     Yes [provider]  fluticasone (FLONASE) 50 MCG/ACT nasal spray Place 2 sprays into both  nostrils daily as needed (sinuses).  01/06/15  Yes [provider]  hydrocortisone (ANUSOL-HC) 25 MG suppository INSERT 1 SUPPOSITORY RECTALLY TWICE A DAY AS NEEDED FOR HEMORRHOIDS 02/07/15  Yes [place], Holli Humbles, NP  Lanreotide Acetate (SOMATULINE DEPOT [place]) Inject 120 mcg into the skin every 28 (twenty-eight) days. Receives at Dr Antonieta Pert office   Yes [provider]  lansoprazole (PREVACID) 30 MG capsule Take 1 capsule (30 mg total) by mouth daily at 12 noon. 11/19/16  Yes Copland, Gay Filler, MD  levothyroxine (SYNTHROID, LEVOTHROID) 50 MCG tablet Take 1 tablet (50 mcg total) by mouth daily. 08/09/17  Yes Copland, Gay Filler, MD  naproxen (NAPROSYN) 500 MG tablet Take 1 tablet (500 mg total) by mouth 2 (two) times daily with a meal. 08/16/17  Yes Leandrew Koyanagi, MD  nitroGLYCERIN (NITROSTAT) 0.4 MG SL tablet Place 1 tablet (0.4 mg total) under the tongue every 5 (five) minutes x 3 doses as needed for chest pain. 02/11/16  Yes Adrian Prows, MD  Omega-3 Fatty Acids (FISH OIL) 1000 MG CAPS Take 1 capsule by mouth daily. Reported on 12/30/2015   Yes [provider]  predniSONE (DELTASONE) 50 MG tablet Take '50mg'$  at 13 hours, 7 hours, and 1 hour before scan 02/18/17  Yes [place], Holli Humbles, NP  sodium chloride (OCEAN) 0.65 % SOLN nasal spray Place 1 spray into both nostrils as needed for congestion.    Yes [provider]  ticagrelor (BRILINTA) 90 MG TABS tablet Take 1 tablet (90 mg total) by mouth 2 (two) times daily. 02/11/16  Yes Adrian Prows, MD  tiZANidine (ZANAFLEX) 4 MG tablet Take 1 tablet (4 mg total) by mouth every 6 (six) hours as needed for muscle spasms. 08/16/17  Yes Leandrew Koyanagi, MD  vitamin B-12 (CYANOCOBALAMIN) 1000 MCG tablet Take 1,000  mcg by mouth daily.   Yes [provider]  diphenhydrAMINE (BENADRYL) 50 MG tablet Take 1 tablet (50 mg total) by mouth once. Take 1 hour prior to scan. 02/18/17 02/18/17  [place], Holli Humbles, NP  metoprolol tartrate  (LOPRESSOR) 25 MG tablet Take 0.5 tablets (12.5 mg total) 2 (two) times daily by mouth. 09/14/17   Queena Monrreal, Wonda Cheng, MD     Allergies:    Allergies  Allergen Reactions   Iohexol Hives     Code: HIVES, Desc: PER [name] @ PRIMARY CARE, PT IS ALLERGIC TO CONTRAST DYE 10/02/08/RM  05/01/10...needs full premeds per our protocol w/ [facility]., Onset Date: [id number]    Iodinated Contrast Media Hives    Social History:   Social History   Socioeconomic History   Marital status: Married    Spouse name: Not on file   Number of children: 1   Years of education: Not on file   Highest education level: Not on file  Occupational History   Occupation: Scientist, clinical (histocompatibility and immunogenetics): [employer]  Tobacco Use   Smoking status: Former    Packs/day: 1.50    Years: 35.00    Total pack years: 52.50    Types: Cigarettes    Start date: 09/25/1959    Quit date: 12/21/1997    Years since quitting: 24.5   Smokeless tobacco: Never  Vaping Use   Vaping Use: Never used  Substance and Sexual Activity   Alcohol use: Yes    Alcohol/week: 3.0 - 4.0 standard drinks of alcohol    Types: 3 - 4 Cans of beer per week    Comment: 4 beers weekly    Drug use: No   Sexual activity: Yes  Other Topics Concern   Not on file  Social History Narrative   Regular Exercise -  NO   Social Determinants of Health   Financial Resource Strain: Low Risk  (02/22/2022)   Overall Financial Resource Strain (CARDIA)    Difficulty of Paying Living Expenses: Not hard at all  Food Insecurity: No Food Insecurity (02/22/2022)   Hunger Vital Sign    Worried About Running Out of Food in the Last Year: Never true    Ran Out of Food in the Last Year: Never true  Transportation Needs: No Transportation Needs (02/22/2022)   PRAPARE - Hydrologist (Medical): No    Lack of Transportation (Non-Medical): No  Physical Activity: Sufficiently Active (02/22/2022)   Exercise Vital Sign    Days of Exercise per Week: 5  days    Minutes of Exercise per Session: 30 min  Stress: No Stress Concern Present (02/22/2022)   [place]    Feeling of Stress : Only a little  Social Connections: Moderately Integrated (02/22/2022)   Social Connection and Isolation Panel [NHANES]    Frequency of Communication with Friends and Family: More than three times a week    Frequency of Social Gatherings with Friends and Family: Once a week    Attends Religious Services: Never    Marine scientist or Organizations: Yes    Attends Music therapist: More than 4 times per year    Marital Status: Married  Human resources officer Violence: Not At Risk (10/13/2021)   Humiliation, Afraid, Rape, and Kick questionnaire    Fear of Current or Ex-Partner: No    Emotionally Abused: No    Physically Abused: No    Sexually  Abused: No    Family History:    Family History  Problem Relation Age of Onset   Kidney disease Mother    Hyperlipidemia Mother    Hypertension Mother    COPD Father    Ulcerative colitis Daughter    Colon cancer Neg Hx    Esophageal cancer Neg Hx    Rectal cancer Neg Hx    Stomach cancer Neg Hx      ROS:  Noted in current history, all other systems are negative.  Physical Exam: Blood pressure (!) 116/56, pulse (!) 56, height 5' 10.5" (1.791 m), weight 182 lb 12.8 oz (82.9 kg), SpO2 97 %.  GEN:  Well nourished, well developed in no acute distress HEENT: Normal NECK: No JVD; No carotid bruits LYMPHATICS: No lymphadenopathy CARDIAC: RRR , no murmurs, rubs, gallops RESPIRATORY:  Clear to auscultation without rales, wheezing or rhonchi  ABDOMEN: Soft, non-tender, non-distended MUSCULOSKELETAL:  No edema; No deformity  SKIN: Warm and dry NEUROLOGIC:  Alert and oriented x 3    EKG:       Relevant CV Studies:    Laboratory Data:  ChemistryNo results for input(s): "NA", "K", "CL", "CO2", "GLUCOSE", "BUN", "CREATININE",  "CALCIUM", "GFRNONAA", "GFRAA", "ANIONGAP" in the last 168 hours.  No results for input(s): "PROT", "ALBUMIN", "AST", "ALT", "ALKPHOS", "BILITOT" in the last 168 hours. HematologyNo results for input(s): "WBC", "RBC", "HGB", "HCT", "MCV", "MCH", "MCHC", "RDW", "PLT" in the last 168 hours. Cardiac EnzymesNo results for input(s): "TROPONINI" in the last 168 hours. No results for input(s): "TROPIPOC" in the last 168 hours.  BNPNo results for input(s): "BNP", "PROBNP" in the last 168 hours.  DDimer No results for input(s): "DDIMER" in the last 168 hours.  Radiology/Studies:  No results found.  Assessment and Plan:     1.  Coronary artery disease:   had repeat PCI at [facility] (opened up a previously placed mid RCA stent )  IVUS at the most recent cath revealed calcium between the layers of the mid RCA stent.   [facility] suggested that Shockwave therapy may be beneficial if he needs further PCI to this region  [place] is doing very well.  He is not having any episodes of chest pain or shortness of breath.  He is walking quite a bit.   2.  Near syncope:    3.  Hypertension:      Blood pressure is well controlled.  Continue current medications.   4.  Hyperlipidemia:    .  Lipid levels look great.  Continue current medications.  5.  Systolic murmur,     Has mild AI, trivial MR and trivial TR   For questions or updates, please contact [place] Please consult [email or web address] for contact info under Cardiology/STEMI.   Signed, Mertie Moores, MD  06/22/2022 10:37 AM

## 2022-06-22 ENCOUNTER — Encounter: Payer: Self-pay | Admitting: Cardiovascular Disease

## 2022-06-22 ENCOUNTER — Other Ambulatory Visit: Payer: Medicare HMO

## 2022-06-22 ENCOUNTER — Ambulatory Visit (INDEPENDENT_AMBULATORY_CARE_PROVIDER_SITE_OTHER): Payer: Medicare HMO | Admitting: Cardiovascular Disease

## 2022-06-22 VITALS — BP 116/56 | HR 56 | Ht 70.5 in | Wt 182.8 lb

## 2022-06-22 DIAGNOSIS — E782 Mixed hyperlipidemia: Secondary | ICD-10-CM

## 2022-06-22 DIAGNOSIS — Z79899 Other long term (current) drug therapy: Secondary | ICD-10-CM | POA: Diagnosis not present

## 2022-06-22 DIAGNOSIS — I351 Nonrheumatic aortic (valve) insufficiency: Secondary | ICD-10-CM

## 2022-06-22 DIAGNOSIS — I2510 Atherosclerotic heart disease of native coronary artery without angina pectoris: Secondary | ICD-10-CM

## 2022-06-22 DIAGNOSIS — I10 Essential (primary) hypertension: Secondary | ICD-10-CM

## 2022-06-22 LAB — LIPID PANEL
Chol/HDL Ratio: 2.2 ratio (ref 0.0–5.0)
Cholesterol, Total: 87 mg/dL — ABNORMAL LOW (ref 100–199)
HDL: 40 mg/dL (ref >39)
LDL Chol Calc (NIH): 29 mg/dL (ref 0–99)
Triglycerides: 91 mg/dL (ref 0–149)
VLDL Cholesterol Cal: 18 mg/dL (ref 5–40)

## 2022-06-22 LAB — BASIC METABOLIC PANEL
BUN/Creatinine Ratio: 21 (ref 10–24)
BUN: 20 mg/dL (ref 8–27)
CO2: 27 mmol/L (ref 20–29)
Calcium: 9.1 mg/dL (ref 8.6–10.2)
Chloride: 101 mmol/L (ref 96–106)
Creatinine, Ser: 0.97 mg/dL (ref 0.76–1.27)
Glucose: 58 mg/dL — ABNORMAL LOW (ref 70–99)
Potassium: 4.2 mmol/L (ref 3.5–5.2)
Sodium: 141 mmol/L (ref 134–144)
eGFR: 81 mL/min/{1.73_m2} (ref >59)

## 2022-06-22 LAB — ALT: ALT: 10 IU/L (ref 0–44)

## 2022-06-22 NOTE — Patient Instructions (Signed)
Medication Instructions:  Your physician recommends that you continue on your current medications as directed. Please refer to the Current Medication list given to you today.  *If you need a refill on your cardiac medications before your next appointment, please call your pharmacy*   Lab Work: NONE If you have labs (blood work) drawn today and your tests are completely normal, you will receive your results only by: MyChart Message (if you have MyChart) OR A paper copy in the mail If you have any lab test that is abnormal or we need to change your treatment, we will call you to review the results.   Testing/Procedures: NONE   Follow-Up: At [facility], you and your health needs are our priority.  As part of our continuing mission to provide you with exceptional heart care, we have created designated Provider Care Teams.  These Care Teams include your primary Cardiologist (physician) and Advanced Practice Providers (APPs -  Physician Assistants and Nurse Practitioners) who all work together to provide you with the care you need, when you need it.  We recommend signing up for the patient portal called "MyChart".  Sign up information is provided on this After Visit Summary.  MyChart is used to connect with patients for Virtual Visits (Telemedicine).  Patients are able to view lab/test results, encounter notes, upcoming appointments, etc.  Non-urgent messages can be sent to your provider as well.   To learn more about what you can do with MyChart, go to [email or web address].    Your next appointment:   1 year(s)  The format for your next appointment:   In Person  Provider:   Philip Nahser, MD     Important Information About Sugar       

## 2022-06-24 ENCOUNTER — Ambulatory Visit: Payer: Self-pay | Admitting: *Deleted

## 2022-06-24 ENCOUNTER — Encounter: Payer: Self-pay | Admitting: Cardiovascular Disease

## 2022-06-24 ENCOUNTER — Encounter: Payer: Self-pay | Admitting: *Deleted

## 2022-06-24 ENCOUNTER — Other Ambulatory Visit: Payer: Self-pay

## 2022-06-24 MED ORDER — RANOLAZINE ER 500 MG PO TB12: 500.0000 mg | ORAL_TABLET | Freq: Two times a day (BID) | ORAL | 3 refills | Status: DC

## 2022-06-24 NOTE — Patient Outreach (Signed)
  Care Coordination   06/24/2022 Name: Brian Mays MRN: [id number] DOB: May 03, 1946   Care Coordination Outreach Attempts:  A second unsuccessful outreach was attempted today to offer the patient with information about available care coordination services as a benefit of their health plan.     Follow Up Plan:  Additional outreach attempts will be made to offer the patient care coordination information and services.   Encounter Outcome:  No Answer patient returned my call earlier today and requested call-back; attempted call, got voice mail, left message  Care Coordination Interventions Activated:  No   Care Coordination Interventions:  No, not indicated unsuccessful outreach # 2   Oneta Rack, RN, BSN, [place] [facility] [phone]: direct office

## 2022-06-24 NOTE — Patient Outreach (Signed)
  Care Coordination   06/24/2022 Name: Brian Mays MRN: [id number] DOB: 04-30-1946   Care Coordination Outreach Attempts:  An unsuccessful telephone outreach was attempted today to offer the patient information about available care coordination services as a benefit of their health plan.   Follow Up Plan:  Additional outreach attempts will be made to offer the patient care coordination information and services.   Encounter Outcome:  No Answer left voice message requesting call back  Care Coordination Interventions Activated:  No   Care Coordination Interventions:  No, not indicated unsuccessful attempt    Oneta Rack, RN, BSN, CCRN Alumnus RN [place] [facility] [phone]: direct office

## 2022-06-24 NOTE — Telephone Encounter (Signed)
Pt calling requesting a refill on ranolazine 500 mg tablet. Dr. Acie Fredrickson did not prescribe this medication. Would Dr. Acie Fredrickson like to refill this medication? Please address

## 2022-06-25 ENCOUNTER — Ambulatory Visit: Payer: Self-pay | Admitting: *Deleted

## 2022-06-25 ENCOUNTER — Encounter: Payer: Self-pay | Admitting: *Deleted

## 2022-06-25 NOTE — Patient Outreach (Signed)
  Care Coordination   Initial Visit Note   06/25/2022 Name: Brian Mays MRN: [id number] DOB: 18-Mar-1946  Brian Mays is a 76 y.o. year old male who sees Copland, Gay Filler, MD for primary care. I spoke with  Brian Mays by phone today.  What matters to the patients health and wellness today?  "I think everything is going well; I saw my cardiologist this week and he has released me for annual follow up only; I have lost weight, about 28 pounds and walk about 5 miles every day; the only question I have is about some of my lab work from my office visit this week- I wonder if he wants to change the dose on my statin? I have put in a mychart message to him, but he is out of town- I know he will get back with me when he gets back"    Goals Addressed             This Visit's Progress    COMPLETED: Care Coordination Activities- No follow up required   On track    Care Coordination Interventions: Evaluation of current treatment plan related to CAD/ HLD and patient's adherence to plan as established by provider Advised patient to provide appropriate vaccination information to provider or CM team member at next visit Advised patient to discuss significance of recent lab results for HLD/ glucose with care providers Provided education to patient re: basic review of lab results from recent cardiology provider office visit Reviewed scheduled/upcoming provider appointments including 07/14/22- oncology provider Advised patient to discuss possible need for changes to dosing of HLD medications with provider Assessed social determinant of health barriers Confirmed patient attended medicare Annual Wellness Visit on 02/22/22; encouraged patient to schedule upcoming flu vaccine for this season with PCP or outpatient pharmacy Discussed patient's caregiving responsibilities for his wife: she is currently under hospice care; patient confirms he is able to get out and walk daily for > 5 miles and goes to play  golf weekly; denies additional needs for resources       SDOH assessments and interventions completed:  Yes  SDOH Interventions Today    Flowsheet Row Most Recent Value  SDOH Interventions   Food Insecurity Interventions Intervention Not Indicated  Transportation Interventions Intervention Not Indicated  [drives self]       Care Coordination Interventions Activated:  Yes  Care Coordination Interventions:  Yes, provided   Follow up plan: No further intervention required. Patient denies further/ ongoing care coordination needs today  Encounter Outcome:  Pt. Visit Completed   Brian Rack, RN, BSN, CCRN Alumnus RN CM Care Coordination/ Transition of [place] [facility] [phone]: direct office

## 2022-06-25 NOTE — Patient Instructions (Signed)
Visit Information  Thank you for taking time to visit with me today. Please don't hesitate to contact me if I can be of assistance to you.   Following are the goals we discussed today:   Goals Addressed             This Visit's Progress    COMPLETED: Care Coordination Activities- No follow up required   On track    Care Coordination Interventions: Evaluation of current treatment plan related to CAD/ HLD and patient's adherence to plan as established by provider Advised patient to provide appropriate vaccination information to provider or CM team member at next visit Advised patient to discuss significance of recent lab results for HLD/ glucose with care providers Provided education to patient re: basic review of lab results from recent cardiology provider office visit Reviewed scheduled/upcoming provider appointments including 07/14/22- oncology provider Advised patient to discuss possible need for changes to dosing of HLD medications with provider Assessed social determinant of health barriers Confirmed patient attended medicare Annual Wellness Visit on 02/22/22; encouraged patient to schedule upcoming flu vaccine for this season with PCP or outpatient pharmacy Discussed patient's caregiving responsibilities for his wife: she is currently under hospice care; patient confirms he is able to get out and walk daily for > 5 miles and goes to play golf weekly; denies additional needs for resources       If you are experiencing a Mental Health or [place] or need someone to talk to, please  call the Suicide and Crisis Lifeline: 988 call the [place] National Suicide Prevention Lifeline: (415)656-7142 or TTY: 450-042-8786 TTY 667-006-5320) to talk to a trained counselor call 1-800-273-TALK (toll free, 24 hour hotline) go to [facility] [address] [place] [phone]) call the [place]: 316-840-4975 call 911    Patient verbalizes understanding of instructions and care plan provided today and agrees to view in [place]. Active MyChart status and patient understanding of how to access instructions and care plan via MyChart confirmed with patient.     No further follow up required: patient denies further or ongoing care coordination needs today  Oneta Rack, RN, BSN, CCRN Alumnus RN CM Care Coordination/ Transition of [place] [facility] [phone]: direct office

## 2022-06-28 NOTE — Telephone Encounter (Signed)
Per OV note on 02/26/22:  Recommendations: Dual anti-platelet therapy. High intensity statin therapy. Continue risk factor modifications. Add Ranexa, if he continues to have symptoms can consider shockwave therapy to the mid RCA  Patient seen again in office on 06/22/22 with Ranexa on med list and per that day's OV note:   Brian Mays is seen for follow up of his CAD , He is not having any angina symptoms  Was having lots of belching  Is exercising / walking quite a bit , is not having any angina  Has lots 28 lbs since last year  Labs drawn today  1.  Coronary artery disease:   had repeat PCI at [facility] (opened up a previously placed mid RCA stent ) IVUS at the most recent cath revealed calcium between the layers of the mid RCA stent.   [facility] suggested that Shockwave therapy may be beneficial if he needs further PCI to this region Brian Mays is doing very well.  He is not having any episodes of chest pain or shortness of breath.  He is walking quite a bit. 2.  Near syncope:  3.  Hypertension:      Blood pressure is well controlled.  Continue current medications. 4.  Hyperlipidemia:    .  Lipid levels look great.  Continue current medications. 5.  Systolic murmur,     Has mild AI, trivial MR and trivial TR   Will refill medication at this time since pt is up to date on appointments and no med changes were made at most recent office visit.

## 2022-07-12 ENCOUNTER — Encounter: Payer: Self-pay | Admitting: Hematology & Oncology

## 2022-07-12 NOTE — Telephone Encounter (Signed)
Pt sent in this my chart message 2 weeks ago would like a response. My Cholesterol and Glucose levels were low and I wanted to ask you if we need to change any medication strength. I also need a refill on Ranolazine which [facility] put me on or do you feel it  should be something different?

## 2022-07-12 NOTE — Telephone Encounter (Signed)
Thayer Headings, MD  06/23/2022  7:54 PM EDT     Glucose level was low. The remaining parts of the BMP are WNL Lipids look great  ALT is stable   Returned call to patient. He states that he was concerned when he saw his LDL was 29 and his glucose was low at 58. Patient states that another MD recently increased his Atorvastatin from '20mg'$  to '80mg'$ . I informed him that this would be the likely cause in drop of LDL, but that from a cardiology standpoint, we want him 55 or below, so this is great. His ALT was not elevated, so could potentially try a lower dose, but no need to at this tim. Pt agrees. As for the glucose, this was a one time reading and he cannot fully attest to whether he had eaten prior to these labs. Advised pt of signs of hypoglycemia-he denies all, so informed that he shouldn't be concerned, but to keep an eye on it and if consistently running low, mention to PCP. Pt is not on any antihyperglycemic medications, so nothing to hold/alter. Patient also attests to recent 30lb weight loss, which I explained could contribute to both of these numbers.  No further questions.

## 2022-07-14 ENCOUNTER — Inpatient Hospital Stay: Payer: Medicare HMO | Attending: Hematology & Oncology | Admitting: Family

## 2022-07-14 ENCOUNTER — Inpatient Hospital Stay: Payer: Medicare HMO

## 2022-07-14 ENCOUNTER — Other Ambulatory Visit: Payer: Self-pay

## 2022-07-14 ENCOUNTER — Encounter: Payer: Self-pay | Admitting: Family

## 2022-07-14 VITALS — BP 122/55 | HR 59 | Temp 98.2°F | Resp 18 | Ht 70.5 in | Wt 184.0 lb

## 2022-07-14 DIAGNOSIS — D500 Iron deficiency anemia secondary to blood loss (chronic): Secondary | ICD-10-CM | POA: Diagnosis not present

## 2022-07-14 DIAGNOSIS — C7B8 Other secondary neuroendocrine tumors: Secondary | ICD-10-CM | POA: Insufficient documentation

## 2022-07-14 DIAGNOSIS — C7A8 Other malignant neuroendocrine tumors: Secondary | ICD-10-CM | POA: Diagnosis not present

## 2022-07-14 LAB — LACTATE DEHYDROGENASE: LDH: 176 U/L (ref 98–192)

## 2022-07-14 LAB — CBC WITH DIFFERENTIAL (CANCER CENTER ONLY)
Abs Immature Granulocytes: 0.01 10*3/uL (ref 0.00–0.07)
Basophils Absolute: 0.1 10*3/uL (ref 0.0–0.1)
Basophils Relative: 1 %
Eosinophils Absolute: 0.4 10*3/uL (ref 0.0–0.5)
Eosinophils Relative: 7 %
HCT: 35.6 % — ABNORMAL LOW (ref 39.0–52.0)
Hemoglobin: 11.3 g/dL — ABNORMAL LOW (ref 13.0–17.0)
Immature Granulocytes: 0 %
Lymphocytes Relative: 13 %
Lymphs Abs: 0.7 10*3/uL (ref 0.7–4.0)
MCH: 30.1 pg (ref 26.0–34.0)
MCHC: 31.7 g/dL (ref 30.0–36.0)
MCV: 94.9 fL (ref 80.0–100.0)
Monocytes Absolute: 0.3 10*3/uL (ref 0.1–1.0)
Monocytes Relative: 6 %
Neutro Abs: 3.7 10*3/uL (ref 1.7–7.7)
Neutrophils Relative %: 73 %
Platelet Count: 122 10*3/uL — ABNORMAL LOW (ref 150–400)
RBC: 3.75 MIL/uL — ABNORMAL LOW (ref 4.22–5.81)
RDW: 14.6 % (ref 11.5–15.5)
WBC Count: 5.1 10*3/uL (ref 4.0–10.5)
nRBC: 0 % (ref 0.0–0.2)

## 2022-07-14 LAB — CMP (CANCER CENTER ONLY)
ALT: 13 U/L (ref 0–44)
AST: 20 U/L (ref 15–41)
Albumin: 3.8 g/dL (ref 3.5–5.0)
Alkaline Phosphatase: 50 U/L (ref 38–126)
Anion gap: 4 — ABNORMAL LOW (ref 5–15)
BUN: 20 mg/dL (ref 8–23)
CO2: 35 mmol/L — ABNORMAL HIGH (ref 22–32)
Calcium: 9.6 mg/dL (ref 8.9–10.3)
Chloride: 103 mmol/L (ref 98–111)
Creatinine: 1.08 mg/dL (ref 0.61–1.24)
GFR, Estimated: >60 mL/min (ref >60)
Glucose, Bld: 108 mg/dL — ABNORMAL HIGH (ref 70–99)
Potassium: 4.4 mmol/L (ref 3.5–5.1)
Sodium: 142 mmol/L (ref 135–145)
Total Bilirubin: 0.8 mg/dL (ref 0.3–1.2)
Total Protein: 6.7 g/dL (ref 6.5–8.1)

## 2022-07-14 MED ORDER — LANREOTIDE ACETATE 120 MG/0.5ML ~~LOC~~ SOLN
120.0000 mg | Freq: Once | SUBCUTANEOUS | Status: AC
  Administered 2022-07-14: 120 mg via SUBCUTANEOUS
  Filled 2022-07-14: qty 120

## 2022-07-14 NOTE — Patient Instructions (Signed)
Lanreotide Injection What is this medication? LANREOTIDE (lan REE oh tide) treats high levels of growth hormone (acromegaly). It is used when other therapies have not worked well enough or cannot be tolerated. It works by reducing the amount of growth hormone your body makes. This reduces symptoms and the risk of health problems caused by too much growth hormone, such as diabetes and heart disease. It may also be used to treat neuroendocrine tumors, a cancer of the cells that release hormones and other substances in your body. It works by slowing down the release of these substances from the cells. This slows tumor growth. It also decreases the symptoms of carcinoid syndrome, such as flushing or diarrhea. This medicine may be used for other purposes; ask your health care provider or pharmacist if you have questions. COMMON BRAND NAME(S): Somatuline [place] What should I tell my care team before I take this medication? They need to know if you have any of these conditions: Diabetes Gallbladder disease Heart disease Kidney disease Liver disease Thyroid disease An unusual or allergic reaction to lanreotide, other medications, foods, dyes, or preservatives Pregnant or trying to get pregnant Breast-feeding How should I use this medication? This medication is injected under the skin. It is given by your care team in a hospital or clinic setting. Talk to your care team about the use of this medication in children. Special care may be needed. Overdosage: If you think you have taken too much of this medicine contact a poison control center or emergency room at once. NOTE: This medicine is only for you. Do not share this medicine with others. What if I miss a dose? Keep appointments for follow-up doses. It is important not to miss your dose. Call your care team if you are unable to keep an appointment. What may interact with this medication? Bromocriptine Cyclosporine Certain medications for blood  pressure, heart disease, irregular heartbeat Certain medications for diabetes Quinidine Terfenadine This list may not describe all possible interactions. Give your health care provider a list of all the medicines, herbs, non-prescription drugs, or dietary supplements you use. Also tell them if you smoke, drink alcohol, or use illegal drugs. Some items may interact with your medicine. What should I watch for while using this medication? Visit your care team for regular checks on your progress. Tell your care team if your symptoms do not start to get better or if they get worse. Your condition will be monitored carefully while you are receiving this medication. You may need blood work while you are taking this medication. This medication may increase blood sugar. The risk may be higher in patients who already have diabetes. Ask your care team what you can do to lower your risk of diabetes while taking this medication. Talk to your care team if you wish to become pregnant or think you may be pregnant. This medication can cause serious birth defects. Do not breast-feed while taking this medication and for 6 months after stopping therapy. This medication may cause infertility. Talk to your care team if you are concerned about your fertility. What side effects may I notice from receiving this medication? Side effects that you should report to your care team as soon as possible: Allergic reactions--skin rash, itching, hives, swelling of the face, lips, tongue, or throat Gallbladder problems--severe stomach pain, nausea, vomiting, fever High blood sugar (hyperglycemia)--increased thirst or amount of urine, unusual weakness or fatigue, blurry vision Increase in blood pressure Low blood sugar (hypoglycemia)--tremors or shaking, anxiety, sweating, cold   or clammy skin, confusion, dizziness, rapid heartbeat Low thyroid levels (hypothyroidism)--unusual weakness or fatigue, increased sensitivity to cold,  constipation, hair loss, dry skin, weight gain, feelings of depression Slow heartbeat--dizziness, feeling faint or lightheaded, confusion, trouble breathing, unusual weakness or fatigue Side effects that usually do not require medical attention (report to your care team if they continue or are bothersome): Diarrhea Dizziness Headache Muscle spasms Nausea Pain, redness, irritation, or bruising at the injection site Stomach pain This list may not describe all possible side effects. Call your doctor for medical advice about side effects. You may report side effects to FDA at 1-800-FDA-1088. Where should I keep my medication? This medication is given in a hospital or clinic. It will not be stored at home. NOTE: This sheet is a summary. It may not cover all possible information. If you have questions about this medicine, talk to your doctor, pharmacist, or health care provider.  2023 Elsevier/Gold Standard (2022-01-06 00:00:00)  

## 2022-07-14 NOTE — Progress Notes (Signed)
Hematology and Oncology Follow Up Visit  Brian Mays [id number] [birth year] 76 y.o. 07/14/2022   Principle Diagnosis:  S/p yttrium-90 intrahepatic therapy - November 2016 Lutathera (Lu 177) injection on 02/01/2018 - s/p cycle 4 SBRT to T6 lesion -- 11/06/2021   Current Therapy:        Somatuline 120 mg monthly IV Iron as indicated  --Injectafer given on 01/2021   Interim History:  [name] is here today for follow-up and treatment. He notes fatigue and states that it is hard to rest well at night. He is caring for his wife who is currently in hospice care at their home.  Chromogranin A last month was 144 (previously 183).  He denies fever, chills, n/v, cough, rash, dizziness, SOB, chest pain, palpitations, abdominal pain or changes in bowel or bladder habits.  No swelling, tenderness, numbness or tingling in his extremities.  No falls or syncope.  Appetite and hydration have been good. His weight is stable at 184 lbs.   ECOG Performance Status: 1 - Symptomatic but completely ambulatory  Medications:  Allergies as of 07/14/2022       Reactions   Iohexol Hives    Code: HIVES, Desc: PER [name] @ [facility]IMARY [facility], PT IS ALLERGIC TO CONTRAST DYE 10/02/08/RM  05/01/10...needs full premeds per our protocol w/ [facility]., Onset Date: [id number]   Iodinated Contrast Media Hives        Medication List        Accurate as of July 14, 2022 10:37 AM. If you have any questions, ask your nurse or doctor.          aspirin EC 81 MG tablet Take 81 mg by mouth every morning.   atorvastatin 80 MG tablet Commonly known as: LIPITOR Take 80 mg by mouth daily.   Cholecalciferol 25 MCG (1000 UT) tablet Take 1,000 Units by mouth 2 (two) times daily.   clopidogrel 75 MG tablet Commonly known as: PLAVIX TAKE 1 TABLET BY MOUTH EVERY DAY   cyanocobalamin 1000 MCG tablet Commonly known as: VITAMIN B12 Take 1,000 mcg by mouth every other day.   diphenhydramine-acetaminophen 25-500 MG Tabs  tablet Commonly known as: TYLENOL PM Take 1 tablet by mouth at bedtime as needed (sleep/pain).   fluticasone 50 MCG/ACT nasal spray Commonly known as: FLONASE Place 2 sprays into both nostrils daily as needed (sinuses).   hydrochlorothiazide 25 MG tablet Commonly known as: HYDRODIURIL Take 0.5 tablets (12.5 mg total) by mouth daily.   hydrocortisone 25 MG suppository Commonly known as: ANUSOL-HC INSERT 1 SUPPOSITORY RECTALLY TWICE A DAY AS NEEDED FOR HEMORRHOIDS   ipratropium 0.03 % nasal spray Commonly known as: Atrovent Place 2 sprays into the nose 3 (three) times daily.   lansoprazole 30 MG capsule Commonly known as: PREVACID Take 1 capsule (30 mg total) by mouth daily at 12 noon.   levothyroxine 50 MCG tablet Commonly known as: SYNTHROID Take 1 tablet (50 mcg total) by mouth daily before breakfast.   losartan 50 MG tablet Commonly known as: COZAAR Take 1 tablet (50 mg total) by mouth daily.   metoprolol tartrate 25 MG tablet Commonly known as: LOPRESSOR Take 0.5 tablets (12.5 mg total) by mouth 2 (two) times daily.   nitroGLYCERIN 0.4 MG SL tablet Commonly known as: NITROSTAT PLACE 1 TABLET UNDER THE TONGUE EVERY 5 (FIVE) MINUTES X 3 DOSES AS NEEDED FOR CHEST PAIN.   polyethylene glycol 17 g packet Commonly known as: MIRALAX / GLYCOLAX Take 17 g by mouth daily.  ranolazine 500 MG 12 hr tablet Commonly known as: RANEXA Take 1 tablet (500 mg total) by mouth 2 (two) times daily.   SOMATULINE DEPOT [place] Inject 120 mcg into the skin every 28 (twenty-eight) days. Receives at Dr Antonieta Pert office   SOOTHE XP OP Place 1 drop into both eyes daily as needed (dry eyes).        Allergies:  Allergies  Allergen Reactions   Iohexol Hives     Code: HIVES, Desc: PER [name] @ [facility]IMARY [facility]ARE, PT IS ALLERGIC TO CONTRAST DYE 10/02/08/RM  05/01/10...needs full premeds per our protocol w/ [facility]., Onset Date: [id number]    Iodinated Contrast Media Hives    Past Medical  History, Surgical history, Social history, and Family History were reviewed and updated.  Review of Systems: All other 10 point review of systems is negative.   Physical Exam:  height is 5' 10.5" (1.791 m) and weight is 184 lb (83.5 kg). His oral temperature is 98.2 F (36.8 C). His blood pressure is 122/55 (abnormal) and his pulse is 59 (abnormal). His respiration is 18 and oxygen saturation is 98%.   Wt Readings from Last 3 Encounters:  07/14/22 184 lb (83.5 kg)  06/22/22 182 lb 12.8 oz (82.9 kg)  06/14/22 183 lb (83 kg)    Ocular: Sclerae unicteric, pupils equal, round and reactive to light Ear-nose-throat: Oropharynx clear, dentition fair Lymphatic: No cervical or supraclavicular adenopathy Lungs no rales or rhonchi, good excursion bilaterally Heart regular rate and rhythm, no murmur appreciated Abd soft, nontender, positive bowel sounds MSK no focal spinal tenderness, no joint edema Neuro: non-focal, well-oriented, appropriate affect Breasts: Deferred   Lab Results  Component Value Date   WBC 5.1 07/14/2022   HGB 11.3 (L) 07/14/2022   HCT 35.6 (L) 07/14/2022   MCV 94.9 07/14/2022   PLT 122 (L) 07/14/2022   Lab Results  Component Value Date   FERRITIN 113 06/14/2022   IRON 60 06/14/2022   TIBC 280 06/14/2022   UIBC 220 06/14/2022   IRONPCTSAT 21 06/14/2022   Lab Results  Component Value Date   RETICCTPCT 1.2 06/14/2022   RBC 3.75 (L) 07/14/2022   No results found for: "KPAFRELGTCHN", "LAMBDASER", "KAPLAMBRATIO" No results found for: "IGGSERUM", "IGA", "IGMSERUM" No results found for: "TOTALPROTELP", "ALBUMINELP", "A1GS", "A2GS", "BETS", "BETA2SER", "GAMS", "MSPIKE", "SPEI"   Chemistry      Component Value Date/Time   NA 141 06/22/2022 0942   NA 144 10/04/2017 0904   NA 141 07/23/2016 1255   K 4.2 06/22/2022 0942   K 3.8 10/04/2017 0904   K 4.1 07/23/2016 1255   CL 101 06/22/2022 0942   CL 103 10/04/2017 0904   CO2 27 06/22/2022 0942   CO2 28 10/04/2017  0904   CO2 27 07/23/2016 1255   BUN 20 06/22/2022 0942   BUN 14 10/04/2017 0904   BUN 13.8 07/23/2016 1255   CREATININE 0.97 06/22/2022 0942   CREATININE 1.13 06/14/2022 0820   CREATININE 0.8 10/04/2017 0904   CREATININE 1.0 07/23/2016 1255      Component Value Date/Time   CALCIUM 9.1 06/22/2022 0942   CALCIUM 9.4 10/04/2017 0904   CALCIUM 9.2 07/23/2016 1255   ALKPHOS 52 06/14/2022 0820   ALKPHOS 64 10/04/2017 0904   ALKPHOS 77 07/23/2016 1255   AST 18 06/14/2022 0820   AST 23 07/23/2016 1255   ALT 10 06/22/2022 0942   ALT 12 06/14/2022 0820   ALT 22 10/04/2017 0904   ALT 13 07/23/2016 1255  BILITOT 0.8 06/14/2022 0820   BILITOT 1.13 07/23/2016 1255       Impression and Plan: Mr. 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   Chromogranin A pending.  Somatuline given as planned.  Follow-up in 1 month.   Lottie Dawson, NP 9/13/202310:37 AM

## 2022-07-15 LAB — CHROMOGRANIN A: Chromogranin A (ng/mL): 124.5 ng/mL — ABNORMAL HIGH (ref 0.0–101.8)

## 2022-07-19 ENCOUNTER — Other Ambulatory Visit: Payer: Self-pay | Admitting: Family Medicine

## 2022-07-20 MED ORDER — ATORVASTATIN CALCIUM 80 MG PO TABS: 80.0000 mg | ORAL_TABLET | Freq: Every day | ORAL | 1 refills | Status: DC

## 2022-07-21 ENCOUNTER — Telehealth: Payer: Self-pay | Admitting: *Deleted

## 2022-07-21 NOTE — Telephone Encounter (Signed)
Per 07/14/22 los - called and lvm of upcoming appointments - requested callback to confirm

## 2022-07-29 DIAGNOSIS — R079 Chest pain, unspecified: Secondary | ICD-10-CM | POA: Diagnosis not present

## 2022-08-11 ENCOUNTER — Other Ambulatory Visit: Payer: Self-pay | Admitting: Family Medicine

## 2022-08-12 ENCOUNTER — Encounter: Payer: Self-pay | Admitting: Family Medicine

## 2022-08-13 ENCOUNTER — Ambulatory Visit (HOSPITAL_COMMUNITY)
Admission: RE | Admit: 2022-08-13 | Discharge: 2022-08-13 | Disposition: A | Discharge status: Home / Self Care | Payer: Medicare HMO | Source: Ambulatory Visit | Attending: Radiation Oncology | Admitting: Radiation Oncology

## 2022-08-13 ENCOUNTER — Encounter: Payer: Self-pay | Admitting: Radiation Oncology

## 2022-08-13 DIAGNOSIS — M5136 Other intervertebral disc degeneration, lumbar region: Secondary | ICD-10-CM | POA: Diagnosis not present

## 2022-08-13 DIAGNOSIS — C7951 Secondary malignant neoplasm of bone: Secondary | ICD-10-CM | POA: Insufficient documentation

## 2022-08-13 DIAGNOSIS — M40204 Unspecified kyphosis, thoracic region: Secondary | ICD-10-CM | POA: Diagnosis not present

## 2022-08-13 DIAGNOSIS — C7A8 Other malignant neuroendocrine tumors: Secondary | ICD-10-CM | POA: Diagnosis not present

## 2022-08-13 DIAGNOSIS — M5137 Other intervertebral disc degeneration, lumbosacral region: Secondary | ICD-10-CM | POA: Diagnosis not present

## 2022-08-13 MED ORDER — GADOBUTROL 1 MMOL/ML IV SOLN
7.5000 mL | Freq: Once | INTRAVENOUS | Status: AC | PRN
  Administered 2022-08-13: 7.5 mL via INTRAVENOUS

## 2022-08-13 NOTE — Progress Notes (Signed)
Telephone nursing appointment for Metastatic malignant neuroendocrine tumor to liver [facility]).    I verified patient's identity and began nursing interview. Patient reports doing well. No related issues reported at this time.   Meaningful use complete.   Patient aware of their 1:00pm-1016/23 telephone appointment w/ Shona Simpson. I left my extension [phone] in case patient needs anything. Patient verbalized understanding. This concludes the nursing interview.   Patient contact [phone]     Leandra Kern, LPN

## 2022-08-16 ENCOUNTER — Telehealth: Payer: Self-pay | Admitting: *Deleted

## 2022-08-16 ENCOUNTER — Ambulatory Visit: Payer: Medicare HMO | Admitting: Family Medicine

## 2022-08-16 ENCOUNTER — Inpatient Hospital Stay: Payer: Medicare HMO | Admitting: Family

## 2022-08-16 ENCOUNTER — Inpatient Hospital Stay: Payer: Medicare HMO

## 2022-08-16 ENCOUNTER — Ambulatory Visit
Admission: RE | Admit: 2022-08-16 | Discharge: 2022-08-16 | Disposition: A | Discharge status: Home / Self Care | Payer: Medicare HMO | Source: Ambulatory Visit | Attending: Radiation Oncology | Admitting: Radiation Oncology

## 2022-08-16 ENCOUNTER — Encounter: Payer: Self-pay | Admitting: Family

## 2022-08-16 ENCOUNTER — Inpatient Hospital Stay: Payer: Medicare HMO | Attending: Hematology & Oncology

## 2022-08-16 ENCOUNTER — Other Ambulatory Visit: Payer: Self-pay

## 2022-08-16 VITALS — BP 115/48 | HR 58 | Temp 98.2°F | Resp 18 | Ht 70.5 in | Wt 180.1 lb

## 2022-08-16 DIAGNOSIS — C7B8 Other secondary neuroendocrine tumors: Secondary | ICD-10-CM

## 2022-08-16 DIAGNOSIS — D500 Iron deficiency anemia secondary to blood loss (chronic): Secondary | ICD-10-CM | POA: Diagnosis not present

## 2022-08-16 DIAGNOSIS — C7A1 Malignant poorly differentiated neuroendocrine tumors: Secondary | ICD-10-CM | POA: Diagnosis not present

## 2022-08-16 DIAGNOSIS — C7A8 Other malignant neuroendocrine tumors: Secondary | ICD-10-CM | POA: Insufficient documentation

## 2022-08-16 DIAGNOSIS — C7951 Secondary malignant neoplasm of bone: Secondary | ICD-10-CM

## 2022-08-16 LAB — CBC WITH DIFFERENTIAL (CANCER CENTER ONLY)
Abs Immature Granulocytes: 0.11 10*3/uL — ABNORMAL HIGH (ref 0.00–0.07)
Basophils Absolute: 0 10*3/uL (ref 0.0–0.1)
Basophils Relative: 1 %
Eosinophils Absolute: 0.4 10*3/uL (ref 0.0–0.5)
Eosinophils Relative: 5 %
HCT: 37.4 % — ABNORMAL LOW (ref 39.0–52.0)
Hemoglobin: 11.7 g/dL — ABNORMAL LOW (ref 13.0–17.0)
Immature Granulocytes: 2 %
Lymphocytes Relative: 11 %
Lymphs Abs: 0.7 10*3/uL (ref 0.7–4.0)
MCH: 30 pg (ref 26.0–34.0)
MCHC: 31.3 g/dL (ref 30.0–36.0)
MCV: 95.9 fL (ref 80.0–100.0)
Monocytes Absolute: 0.4 10*3/uL (ref 0.1–1.0)
Monocytes Relative: 6 %
Neutro Abs: 4.9 10*3/uL (ref 1.7–7.7)
Neutrophils Relative %: 75 %
Platelet Count: 141 10*3/uL — ABNORMAL LOW (ref 150–400)
RBC: 3.9 MIL/uL — ABNORMAL LOW (ref 4.22–5.81)
RDW: 14.6 % (ref 11.5–15.5)
WBC Count: 6.5 10*3/uL (ref 4.0–10.5)
nRBC: 0 % (ref 0.0–0.2)

## 2022-08-16 LAB — CMP (CANCER CENTER ONLY)
ALT: 12 U/L (ref 0–44)
AST: 19 U/L (ref 15–41)
Albumin: 4 g/dL (ref 3.5–5.0)
Alkaline Phosphatase: 50 U/L (ref 38–126)
Anion gap: 7 (ref 5–15)
BUN: 21 mg/dL (ref 8–23)
CO2: 32 mmol/L (ref 22–32)
Calcium: 9.9 mg/dL (ref 8.9–10.3)
Chloride: 104 mmol/L (ref 98–111)
Creatinine: 1.09 mg/dL (ref 0.61–1.24)
GFR, Estimated: >60 mL/min (ref >60)
Glucose, Bld: 97 mg/dL (ref 70–99)
Potassium: 4.2 mmol/L (ref 3.5–5.1)
Sodium: 143 mmol/L (ref 135–145)
Total Bilirubin: 0.8 mg/dL (ref 0.3–1.2)
Total Protein: 7.2 g/dL (ref 6.5–8.1)

## 2022-08-16 LAB — LACTATE DEHYDROGENASE: LDH: 192 U/L (ref 98–192)

## 2022-08-16 MED ORDER — LANREOTIDE ACETATE 120 MG/0.5ML ~~LOC~~ SOLN
120.0000 mg | Freq: Once | SUBCUTANEOUS | Status: AC
  Administered 2022-08-16: 120 mg via SUBCUTANEOUS
  Filled 2022-08-16: qty 120

## 2022-08-16 NOTE — Patient Instructions (Signed)
Lanreotide Injection What is this medication? LANREOTIDE (lan REE oh tide) treats high levels of growth hormone (acromegaly). It is used when other therapies have not worked well enough or cannot be tolerated. It works by reducing the amount of growth hormone your body makes. This reduces symptoms and the risk of health problems caused by too much growth hormone, such as diabetes and heart disease. It may also be used to treat neuroendocrine tumors, a cancer of the cells that release hormones and other substances in your body. It works by slowing down the release of these substances from the cells. This slows tumor growth. It also decreases the symptoms of carcinoid syndrome, such as flushing or diarrhea. This medicine may be used for other purposes; ask your health care provider or pharmacist if you have questions. COMMON BRAND NAME(S): Somatuline [place] What should I tell my care team before I take this medication? They need to know if you have any of these conditions: Diabetes Gallbladder disease Heart disease Kidney disease Liver disease Thyroid disease An unusual or allergic reaction to lanreotide, other medications, foods, dyes, or preservatives Pregnant or trying to get pregnant Breast-feeding How should I use this medication? This medication is injected under the skin. It is given by your care team in a hospital or clinic setting. Talk to your care team about the use of this medication in children. Special care may be needed. Overdosage: If you think you have taken too much of this medicine contact a poison control center or emergency room at once. NOTE: This medicine is only for you. Do not share this medicine with others. What if I miss a dose? Keep appointments for follow-up doses. It is important not to miss your dose. Call your care team if you are unable to keep an appointment. What may interact with this medication? Bromocriptine Cyclosporine Certain medications for blood  pressure, heart disease, irregular heartbeat Certain medications for diabetes Quinidine Terfenadine This list may not describe all possible interactions. Give your health care provider a list of all the medicines, herbs, non-prescription drugs, or dietary supplements you use. Also tell them if you smoke, drink alcohol, or use illegal drugs. Some items may interact with your medicine. What should I watch for while using this medication? Visit your care team for regular checks on your progress. Tell your care team if your symptoms do not start to get better or if they get worse. Your condition will be monitored carefully while you are receiving this medication. You may need blood work while you are taking this medication. This medication may increase blood sugar. The risk may be higher in patients who already have diabetes. Ask your care team what you can do to lower your risk of diabetes while taking this medication. Talk to your care team if you wish to become pregnant or think you may be pregnant. This medication can cause serious birth defects. Do not breast-feed while taking this medication and for 6 months after stopping therapy. This medication may cause infertility. Talk to your care team if you are concerned about your fertility. What side effects may I notice from receiving this medication? Side effects that you should report to your care team as soon as possible: Allergic reactions--skin rash, itching, hives, swelling of the face, lips, tongue, or throat Gallbladder problems--severe stomach pain, nausea, vomiting, fever High blood sugar (hyperglycemia)--increased thirst or amount of urine, unusual weakness or fatigue, blurry vision Increase in blood pressure Low blood sugar (hypoglycemia)--tremors or shaking, anxiety, sweating, cold   or clammy skin, confusion, dizziness, rapid heartbeat Low thyroid levels (hypothyroidism)--unusual weakness or fatigue, increased sensitivity to cold,  constipation, hair loss, dry skin, weight gain, feelings of depression Slow heartbeat--dizziness, feeling faint or lightheaded, confusion, trouble breathing, unusual weakness or fatigue Side effects that usually do not require medical attention (report to your care team if they continue or are bothersome): Diarrhea Dizziness Headache Muscle spasms Nausea Pain, redness, irritation, or bruising at the injection site Stomach pain This list may not describe all possible side effects. Call your doctor for medical advice about side effects. You may report side effects to FDA at 1-800-FDA-1088. Where should I keep my medication? This medication is given in a hospital or clinic. It will not be stored at home. NOTE: This sheet is a summary. It may not cover all possible information. If you have questions about this medicine, talk to your doctor, pharmacist, or health care provider.  2023 Elsevier/Gold Standard (2022-01-06 00:00:00)  

## 2022-08-16 NOTE — Progress Notes (Signed)
Hematology and Oncology Follow Up Visit  Brian Mays [id number] 01/27/1946 76 y.o. 08/16/2022   Principle Diagnosis:  S/p yttrium-90 intrahepatic therapy - November 2016 Lutathera (Lu 177) injection on 02/01/2018 - s/p cycle 4 SBRT to T6 lesion -- 11/06/2021   Current Therapy:        Somatuline 120 mg monthly IV Iron as indicated  --Injectafer given on 01/2021   Interim History:  Brian Mays is here today for follow-up and Somatuline treatment. He is doing fairly well. His wife's health continues to decline and he is her main caregiver. This has been stressful for him. Hospice and his daughter have been very helpful.  He has been walking daily and slips in a game of golf when he is able.  Chromogranin A last month was 124.  No fever, chills, n/v, cough, rash, dizziness, SOB, chest pain, palpitations, abdominal pain or changes in bowel or bladder habits.  No blood loss, bruising or petechiae noted.  No swelling, tenderness, numbness or tingling in his extremities at this time.  No falls or syncope reported.  Appetite and hydration are good. Weight is 180 lbs.   ECOG Performance Status: 1 - Symptomatic but completely ambulatory  Medications:  Allergies as of 08/16/2022       Reactions   Iohexol Hives    Code: HIVES, Desc: PER [name] @ [facility]IMARY [facility], PT IS ALLERGIC TO CONTRAST DYE 10/02/08/RM  05/01/10...needs full premeds per our protocol w/ [facility]., Onset Date: [id number]   Iodinated Contrast Media Hives        Medication List        Accurate as of August 16, 2022 10:41 AM. If you have any questions, ask your nurse or doctor.          aspirin EC 81 MG tablet Take 81 mg by mouth every morning.   atorvastatin 80 MG tablet Commonly known as: LIPITOR Take 1 tablet (80 mg total) by mouth daily.   Cholecalciferol 25 MCG (1000 UT) tablet Take 1,000 Units by mouth 2 (two) times daily.   clopidogrel 75 MG tablet Commonly known as: PLAVIX TAKE 1 TABLET BY MOUTH EVERY  DAY   cyanocobalamin 1000 MCG tablet Commonly known as: VITAMIN B12 Take 1,000 mcg by mouth every other day.   diphenhydramine-acetaminophen 25-500 MG Tabs tablet Commonly known as: TYLENOL PM Take 1 tablet by mouth at bedtime as needed (sleep/pain).   fluticasone 50 MCG/ACT nasal spray Commonly known as: FLONASE Place 2 sprays into both nostrils daily as needed (sinuses).   hydrochlorothiazide 25 MG tablet Commonly known as: HYDRODIURIL Take 0.5 tablets (12.5 mg total) by mouth daily.   hydrocortisone 25 MG suppository Commonly known as: ANUSOL-HC INSERT 1 SUPPOSITORY RECTALLY TWICE A DAY AS NEEDED FOR HEMORRHOIDS   ipratropium 0.03 % nasal spray Commonly known as: Atrovent Place 2 sprays into the nose 3 (three) times daily.   lansoprazole 30 MG capsule Commonly known as: PREVACID TAKE 1 CAPSULE (30 MG TOTAL) BY MOUTH DAILY AT 12 NOON.   levothyroxine 50 MCG tablet Commonly known as: SYNTHROID Take 1 tablet (50 mcg total) by mouth daily before breakfast.   losartan 50 MG tablet Commonly known as: COZAAR Take 1 tablet (50 mg total) by mouth daily.   metoprolol tartrate 25 MG tablet Commonly known as: LOPRESSOR Take 0.5 tablets (12.5 mg total) by mouth 2 (two) times daily.   nitroGLYCERIN 0.4 MG SL tablet Commonly known as: NITROSTAT PLACE 1 TABLET UNDER THE TONGUE EVERY 5 (FIVE) MINUTES X  3 DOSES AS NEEDED FOR CHEST PAIN.   polyethylene glycol 17 g packet Commonly known as: MIRALAX / GLYCOLAX Take 17 g by mouth daily.   ranolazine 500 MG 12 hr tablet Commonly known as: RANEXA Take 1 tablet (500 mg total) by mouth 2 (two) times daily.   SOMATULINE DEPOT [place] Inject 120 mcg into the skin every 28 (twenty-eight) days. Receives at Dr Antonieta Pert office   SOOTHE XP OP Place 1 drop into both eyes daily as needed (dry eyes).        Allergies:  Allergies  Allergen Reactions   Iohexol Hives     Code: HIVES, Desc: PER [name] @ [facility]IMARY [facility]ARE, PT IS ALLERGIC TO  CONTRAST DYE 10/02/08/RM  05/01/10...needs full premeds per our protocol w/ [facility]., Onset Date: [id number]    Iodinated Contrast Media Hives    Past Medical History, Surgical history, Social history, and Family History were reviewed and updated.  Review of Systems: All other 10 point review of systems is negative.   Physical Exam:  height is 5' 10.5" (1.791 m) and weight is 180 lb 1.9 oz (81.7 kg). His oral temperature is 98.2 F (36.8 C). His blood pressure is 115/48 (abnormal) and his pulse is 58 (abnormal). His respiration is 18 and oxygen saturation is 100%.   Wt Readings from Last 3 Encounters:  08/16/22 180 lb 1.9 oz (81.7 kg)  07/14/22 184 lb (83.5 kg)  06/22/22 182 lb 12.8 oz (82.9 kg)    Ocular: Sclerae unicteric, pupils equal, round and reactive to light Ear-nose-throat: Oropharynx clear, dentition fair Lymphatic: No cervical or supraclavicular adenopathy Lungs no rales or rhonchi, good excursion bilaterally Heart regular rate and rhythm, no murmur appreciated Abd soft, nontender, positive bowel sounds MSK no focal spinal tenderness, no joint edema Neuro: non-focal, well-oriented, appropriate affect Breasts: Deferred   Lab Results  Component Value Date   WBC 6.5 08/16/2022   HGB 11.7 (L) 08/16/2022   HCT 37.4 (L) 08/16/2022   MCV 95.9 08/16/2022   PLT 141 (L) 08/16/2022   Lab Results  Component Value Date   FERRITIN 113 06/14/2022   IRON 60 06/14/2022   TIBC 280 06/14/2022   UIBC 220 06/14/2022   IRONPCTSAT 21 06/14/2022   Lab Results  Component Value Date   RETICCTPCT 1.2 06/14/2022   RBC 3.90 (L) 08/16/2022   No results found for: "KPAFRELGTCHN", "LAMBDASER", "KAPLAMBRATIO" No results found for: "IGGSERUM", "IGA", "IGMSERUM" No results found for: "TOTALPROTELP", "ALBUMINELP", "A1GS", "A2GS", "BETS", "BETA2SER", "GAMS", "MSPIKE", "SPEI"   Chemistry      Component Value Date/Time   NA 143 08/16/2022 1001   NA 141 06/22/2022 0942   NA 144  10/04/2017 0904   NA 141 07/23/2016 1255   K 4.2 08/16/2022 1001   K 3.8 10/04/2017 0904   K 4.1 07/23/2016 1255   CL 104 08/16/2022 1001   CL 103 10/04/2017 0904   CO2 32 08/16/2022 1001   CO2 28 10/04/2017 0904   CO2 27 07/23/2016 1255   BUN 21 08/16/2022 1001   BUN 20 06/22/2022 0942   BUN 14 10/04/2017 0904   BUN 13.8 07/23/2016 1255   CREATININE 1.09 08/16/2022 1001   CREATININE 0.8 10/04/2017 0904   CREATININE 1.0 07/23/2016 1255      Component Value Date/Time   CALCIUM 9.9 08/16/2022 1001   CALCIUM 9.4 10/04/2017 0904   CALCIUM 9.2 07/23/2016 1255   ALKPHOS 50 08/16/2022 1001   ALKPHOS 64 10/04/2017 0904   ALKPHOS 77 07/23/2016 1255  AST 19 08/16/2022 1001   AST 23 07/23/2016 1255   ALT 12 08/16/2022 1001   ALT 22 10/04/2017 0904   ALT 13 07/23/2016 1255   BILITOT 0.8 08/16/2022 1001   BILITOT 1.13 07/23/2016 1255       Impression and Plan: Mr. 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   Chromogranin A pending.  Somatuline given.  Follow-up in 1 month.   Lottie Dawson, NP 10/16/202310:41 AM

## 2022-08-16 NOTE — Progress Notes (Signed)
Radiation Oncology         [phone] ________________________________  Outpatient Follow Up - Conducted via telephone at patient request.  I spoke with the patient to conduct this consult visit via telephone. The patient was notified in advance and was offered an in person or telemedicine meeting to allow for face to face communication but instead preferred to proceed with a telephone consult.    Name: Brian Mays        MRN: [id number]  Date of Service: 08/16/2022 DOB: [birth year]  WU:[id number], Gay Filler, MD  Copland, Gay Filler, MD     REFERRING PHYSICIAN: Copland, Gay Filler, MD   DIAGNOSIS: There were no encounter diagnoses.   HISTORY OF PRESENT ILLNESS: Brian Mays is a 76 y.o. male s with a history of neuroendocrine tumor in 2014, originally in the small bowel.  He has had known metastatic disease and has received Y 90 intrahepatic therapy in November 2016, Lutathera injection x4 cycles in 2019 and has remained on Somatuline under the care of Dr. Marin Olp.  PET in October 2022 showed a lesion in T6 vertebral body concerning for further neuroendocrine tumor disease stable mild uptake in the T12 vertebral body was also appreciated and hepatic metastases and central mesenteric mass were also noted but stable.  An MRI of the thoracic spine on 09/14/2021 showed enhancing lesions at T6 and T12 consistent with metastatic disease but without pathologic fracture or compromise to his spinal canal or cord.  He went on to receive stereotactic radiosurgery to T6 and T12 and single fraction style therapy which she completed in January 2023.  His first posttreatment MRI of the spine on 02/04/2022 showed posttreatment effect and T6 and T12 but a 4 mm enhancing lesion in the T7 vertebral body that was felt to be new since prior imaging.  He had a PET scan in April 2023 and there were no findings in the T7 location.  Findings from his July MRI of the thoracic spine were similar and mentations were to  repeat the MRI 3 to 4 months out. He continues on his systemic therapy.  His total spine MRI on 08/13/2022 showed stable findings in T6 and T12.  T7 also remained stable without progression.  Hemangioma was again stable at T5.  They did describe hemangiomatous changes at L3 and indeterminate changes at L4.  He does have arthritic changes especially along C3-4 on the left-hand side.  He is contacted today to review these results.   PREVIOUS RADIATION THERAPY:     11/06/2021 through 11/06/2021 [facility] Spine Site Technique Total Dose (Gy) Dose per Fx (Gy) Completed Fx Beam Energies  Thoracic Spine: Spine_T6 IMRT 18/18 18 1/1 6XFFF  Thoracic Spine: Spine_T12         Lutathera (Lu 177) injection on 02/01/2018 - s/p cycle 4  PAST MEDICAL HISTORY:  Past Medical History:  Diagnosis Date   Anemia    in past   Cataract    Colon polyps    Diverticulosis    Gallstones    GERD (gastroesophageal reflux disease)    Heart attack ([place])    mild, Spring 2017   Heart murmur    Hemorrhoid    Hiatal hernia    HTN (hypertension)    Hyperlipidemia    Hypothyroidism    Iron deficiency anemia due to chronic blood loss 03/29/2019   Iron malabsorption 03/29/2019   Metastatic carcinoma ([place]) 2010   Dr Jonette Eva   Neuroendocrine cancer [facility]) 08/2021   Pancreatitis  1998   chronic       PAST SURGICAL HISTORY: Past Surgical History:  Procedure Laterality Date   APPENDECTOMY  1962   CARDIAC CATHETERIZATION N/A 02/10/2016   Procedure: Left Heart Cath and Coronary Angiography;  Surgeon: Adrian Prows, MD;  Location: [place] [facility];  Service: Cardiovascular;  Laterality: N/A;   CARDIAC CATHETERIZATION  02/10/2016   Procedure: Coronary/Graft Atherectomy;  Surgeon: Adrian Prows, MD;  Location: [place] [facility];  Service: Cardiovascular;;   CARDIAC CATHETERIZATION  02/10/2016   Procedure: Coronary Stent Intervention;  Surgeon: Adrian Prows, MD;  Location: [place] [facility];  Service: Cardiovascular;;   CATARACT EXTRACTION  W/ INTRAOCULAR LENS  IMPLANT, BILATERAL Bilateral    CORONARY STENT INTERVENTION N/A 12/16/2021   Procedure: CORONARY STENT INTERVENTION;  Surgeon: Early Osmond, MD;  Location: [place] [facility];  Service: Cardiovascular;  Laterality: N/A;   IR GENERIC HISTORICAL  12/30/2015   IR RADIOLOGIST EVAL & MGMT 12/30/2015 Aletta Edouard, MD [facility] INTERV RAD   IR GENERIC HISTORICAL  11/09/2016   IR RADIOLOGIST EVAL & MGMT 11/09/2016 Aletta Edouard, MD [facility] INTERV RAD   IR RADIOLOGIST EVAL & MGMT  03/01/2017   IR RADIOLOGIST EVAL & MGMT  06/29/2017   LAPAROSCOPIC CHOLECYSTECTOMY  1999   LEFT HEART CATH AND CORONARY ANGIOGRAPHY N/A 12/16/2021   Procedure: LEFT HEART CATH AND CORONARY ANGIOGRAPHY;  Surgeon: Early Osmond, MD;  Location: [place] [facility];  Service: Cardiovascular;  Laterality: N/A;   LIVER BIOPSY  2010   RADIOACTIVE SEED IMPLANT  X 3   "to my liver"   TUMOR EXCISION  01/2009   Carcinoil Resection    TUMOR REMOVAL     from small intestine     FAMILY HISTORY:  Family History  Problem Relation Age of Onset   Kidney disease Mother    Hyperlipidemia Mother    Hypertension Mother    COPD Father    Ulcerative colitis Daughter    Colon cancer Neg Hx    Esophageal cancer Neg Hx    Rectal cancer Neg Hx    Stomach cancer Neg Hx      SOCIAL HISTORY:  reports that he quit smoking about 24 years ago. His smoking use included cigarettes. He started smoking about 62 years ago. He has a 52.50 pack-year smoking history. He has never used smokeless tobacco. He reports current alcohol use of about 3.0 - 4.0 standard drinks of alcohol per week. He reports that he does not use drugs. The patient is married and lives in [place]. His wife is also a patient at the cancer center with Dr. Isidore Moos and Dr. Mickeal Skinner, and remains in hospice care.    ALLERGIES: Iohexol and Iodinated contrast media   MEDICATIONS:  Current Outpatient Medications  Medication Sig Dispense Refill   Artificial Tear  Solution (SOOTHE XP OP) Place 1 drop into both eyes daily as needed (dry eyes).     aspirin EC 81 MG tablet Take 81 mg by mouth every morning.     atorvastatin (LIPITOR) 80 MG tablet Take 1 tablet (80 mg total) by mouth daily. 90 tablet 1   Cholecalciferol 1000 UNITS tablet Take 1,000 Units by mouth 2 (two) times daily.     clopidogrel (PLAVIX) 75 MG tablet TAKE 1 TABLET BY MOUTH EVERY DAY 90 tablet 3   diphenhydramine-acetaminophen (TYLENOL PM) 25-500 MG TABS tablet Take 1 tablet by mouth at bedtime as needed (sleep/pain).     fluticasone (FLONASE) 50 MCG/ACT nasal  spray Place 2 sprays into both nostrils daily as needed (sinuses).   11   hydrochlorothiazide (HYDRODIURIL) 25 MG tablet Take 0.5 tablets (12.5 mg total) by mouth daily. 45 tablet 3   hydrocortisone (ANUSOL-HC) 25 MG suppository INSERT 1 SUPPOSITORY RECTALLY TWICE A DAY AS NEEDED FOR HEMORRHOIDS 30 suppository 0   ipratropium (ATROVENT) 0.03 % nasal spray Place 2 sprays into the nose 3 (three) times daily. 30 mL 6   Lanreotide Acetate (SOMATULINE DEPOT [place]) Inject 120 mcg into the skin every 28 (twenty-eight) days. Receives at Dr Antonieta Pert office     lansoprazole (PREVACID) 30 MG capsule TAKE 1 CAPSULE (30 MG TOTAL) BY MOUTH DAILY AT 12 NOON. 90 capsule 3   levothyroxine (SYNTHROID) 50 MCG tablet Take 1 tablet (50 mcg total) by mouth daily before breakfast. 90 tablet 0   losartan (COZAAR) 50 MG tablet Take 1 tablet (50 mg total) by mouth daily. 90 tablet 3   metoprolol tartrate (LOPRESSOR) 25 MG tablet Take 0.5 tablets (12.5 mg total) by mouth 2 (two) times daily. 90 tablet 3   nitroGLYCERIN (NITROSTAT) 0.4 MG SL tablet PLACE 1 TABLET UNDER THE TONGUE EVERY 5 (FIVE) MINUTES X 3 DOSES AS NEEDED FOR CHEST PAIN. (Patient not taking: Reported on 07/14/2022) 25 tablet 4   polyethylene glycol (MIRALAX / GLYCOLAX) 17 g packet Take 17 g by mouth daily.     ranolazine (RANEXA) 500 MG 12 hr tablet Take 1 tablet (500 mg total) by mouth 2 (two) times  daily. 180 tablet 3   vitamin B-12 (CYANOCOBALAMIN) 1000 MCG tablet Take 1,000 mcg by mouth every other day.     No current facility-administered medications for this encounter.     REVIEW OF SYSTEMS: On review of systems, the patient reports he is doing doing well overall.  He has been tending to his wife's care as she continues with hospice.  He is looking into having a home health aide to try and help stay with her part of the day.  He is hopeful to remain able to keep her at home.  He still enjoys going walking and hiking and states that his neck does seem to cause him some discomfort more so on the left but he believes this is from looking down trying to avoid roots on trails.  He denies any low back pain or pain in the middle section of his back.  He is not having any loss of control of his lower or upper extremities.  No other complaints are verbalized.    PHYSICAL EXAM:   Pain Assessment Pain Score: 0-No pain/10  Unable to assess given encounter type.  ECOG = 1  0 - Asymptomatic (Fully active, able to carry on all predisease activities without restriction)  1 - Symptomatic but completely ambulatory (Restricted in physically strenuous activity but ambulatory and able to carry out work of a light or sedentary nature. For example, light housework, office work)  2 - Symptomatic, <50% in bed during the day (Ambulatory and capable of all self care but unable to carry out any work activities. Up and about more than 50% of waking hours)  3 - Symptomatic, >50% in bed, but not bedbound (Capable of only limited self-care, confined to bed or chair 50% or more of waking hours)  4 - Bedbound (Completely disabled. Cannot carry on any self-care. Totally confined to bed or chair)  5 - Death   Eustace Pen MM, Creech RH, Tormey DC, et al. [phone]). "Toxicity and response criteria of  the [facility]". [place] Oncol. 5 (6): 649-55    LABORATORY DATA:  Lab Results  Component  Value Date   WBC 6.5 08/16/2022   HGB 11.7 (L) 08/16/2022   HCT 37.4 (L) 08/16/2022   MCV 95.9 08/16/2022   PLT 141 (L) 08/16/2022   Lab Results  Component Value Date   NA 143 08/16/2022   K 4.2 08/16/2022   CL 104 08/16/2022   CO2 32 08/16/2022   Lab Results  Component Value Date   ALT 12 08/16/2022   AST 19 08/16/2022   ALKPHOS 50 08/16/2022   BILITOT 0.8 08/16/2022      RADIOGRAPHY: MR TOTAL SPINE METS SCREENING  Result Date: 08/14/2022 CLINICAL DATA:  76 year old male with metastatic well differentiated neuroendocrine tumor. History of treated T6 and T12 metastases, subsequent small T7 metastasis diagnosed initially by MRI in April. EXAM: MRI TOTAL SPINE WITHOUT AND WITH CONTRAST TECHNIQUE: Multisequence MR imaging of the spine from the cervical spine to the sacrum was performed prior to and following IV contrast administration for evaluation of spinal metastatic disease. CONTRAST:  7.5 mL Gadavist COMPARISON:  No prior cervical MRI or CT. Multiple prior thoracic MRIs since 05/06/2022. Previous lumbar MRI 07/12/2008 report (no images available). PET-CT 02/11/2022. CT Abdomen and Pelvis 11/13/2021. FINDINGS: MRI CERVICAL SPINE FINDINGS Alignment: Mild straightening of cervical lordosis. No spondylolisthesis. Vertebrae: Visualized bone marrow signal is within normal limits; there are mild degenerative marrow signal changes such as in the left C3-C4 facets. No destructive or suspicious osseous lesion in the cervical spine. Cord: Within normal limits. No abnormal intradural enhancement. No dural thickening. Posterior Fossa, vertebral arteries, paraspinal tissues: Cervicomedullary junction is within normal limits. Negative visible posterior fossa. Limited visible paraspinal soft tissues appear grossly negative. Disc levels: No age advanced degenerative changes, no cervical spinal stenosis. MRI THORACIC SPINE FINDINGS Segmentation: Normal, same numbering system as on prior thoracic MRI.  Alignment: Stable. Relatively maintained thoracic kyphosis. No spondylolisthesis. Vertebrae: Increased intrinsic T1 signal in the T6 and T12 vertebral bodies, also the left half of T5, likely post radiation related and stable. Superimposed small round precontrast hypointense, enhancing lesions in the left T6 posterior elements (series 13, image 5), T7 body (series 13, image 6), T12 body and left pedicle (series 14 images 9 and 15) remain stable since July. Chronic T5 vertebral body heterogeneity compatible with hemangioma remain stable. No new or increased thoracic vertebral marrow signal abnormality. Cord: Within normal limits. Conus medullaris at T12-L1. No abnormal intradural enhancement. No dural thickening. Paraspinal and other soft tissues: Stable. Axial images of thoracic levels also included. Disc levels: Stable. No age advanced thoracic spine degeneration or spinal stenosis. MRI LUMBAR SPINE FINDINGS Segmentation:  Normal. Alignment: Maintained lumbar lordosis. No significant scoliosis or spondylolisthesis. Vertebrae: Background marrow signal heterogeneity similar to the remaining spine, visible sacrum and pelvis. Small round 10 mm L4 superior vertebral body lesion has both intrinsic peripheral and isointense to decreased central pre contrast T1 signal with relatively homogeneous enhancement and STIR hyperintensity. See series 8, image 14, series 7, image 14 and series 14 image 14. This is not correlated on prior CT or PET. Furthermore, there is a less distinct roughly 2 cm area of mixed signal heterogeneity and enhancement (series 14, image 7) in the right half of the L3 vertebral body. On prior CT this resembles a benign hemangioma (series 6, image 73 of [id number]). And also has a somewhat stippled appearance on MRI. No other suspicious lumbar marrow lesion. Conus  medullaris: Extends to the T12-L1 level and appears normal. Cauda equina nerve roots appear normal. No abnormal intradural enhancement. No dural  thickening. Paraspinal and other soft tissues: Stable. Disc levels: Lumbar disc and endplate degeneration maximal at L4-L5 and L5-S1. But no advanced lumbar spinal stenosis. IMPRESSION: 1. No cervical spine metastatic disease identified. 2. Continued stable MRI appearance of the thoracic spine, with unchanged small treated T6 and T12 metastasis, and stable suspected small T7 body metastasis (T5 benign hemangioma). 3. Two indeterminate lumbar vertebral lesions at L3 (favor a benign hemangioma given CT and MRI appearance) and L4 (indeterminate). Repeat Lumbar MRI can be utilized for surveillance. 4. Generally age-appropriate underlying spinal degeneration. No significant spinal stenosis. Electronically Signed   By: Genevie Ann M.D.   On: 08/14/2022 11:04       IMPRESSION/PLAN: 1. Metastatic Neuroendocrine Tumor to Thoracic Spine. We discussed the patient's imaging results and stable findings from his thoracic MRI scan. We reviewed the recommendations of repeat MRI in about 3  months, due to the findings in the L4 level.  He is in agreement with this and will let us know if he is having any pain before this.  We discussed the rationale for moving to 59-monthscans to follow if all is stable.  He will otherwise continue Somatuline with Dr. EArelia Sneddonand let uKoreaknow if he has any other complaints or concerns.    This encounter was conducted via telephone.  The patient has provided two factor identification and has given verbal consent for this type of encounter and has been advised to only accept a meeting of this type in a secure network environment. The time spent during this encounter was 35 minutes including preparation, discussion, and coordination of the patient's care. The attendants for this meeting include AHayden Pedro and MDorthula Perfect  During the encounter,  [facility]as [facility].  MDorthula Perfectwas located at home.         ACarola Rhine PMount [name]  **Disclaimer: This note was dictated with voice recognition software. Similar sounding words can inadvertently be transcribed and this note may contain transcription errors which may not have been corrected upon publication of note.**

## 2022-08-16 NOTE — Telephone Encounter (Signed)
Per 08/16/22 los - called and gave upcoming appointments - confirmed

## 2022-08-17 ENCOUNTER — Other Ambulatory Visit: Payer: Self-pay | Admitting: Radiation Therapy

## 2022-08-17 DIAGNOSIS — C7951 Secondary malignant neoplasm of bone: Secondary | ICD-10-CM

## 2022-08-18 LAB — CHROMOGRANIN A: Chromogranin A (ng/mL): 118.2 ng/mL — ABNORMAL HIGH (ref 0.0–101.8)

## 2022-08-20 ENCOUNTER — Other Ambulatory Visit: Payer: Self-pay | Admitting: Family Medicine

## 2022-08-20 DIAGNOSIS — E039 Hypothyroidism, unspecified: Secondary | ICD-10-CM

## 2022-09-07 NOTE — Progress Notes (Unsigned)
[place] at [facility] [address] [place], [place] [address] [phone] [phone]  Date:  09/08/2022   Name:  Brian Mays   DOB:  [birth year]   MRN:  [id number]  PCP:  Darreld Mclean, MD    Chief Complaint: No chief complaint on file.   History of Present Illness:  Brian Mays is a 76 y.o. very pleasant male patient who presents with the following:  Patient seen today for periodic follow-up- history of metastatic malignant neuroendocrine tumor in the liver/carcinoid syndrome status post partial small bowel resection in 2010, as well as iron malabsorption, CAD status post stenting in 2017, hypothyroidism, hypertension, chronic pancreatitis  His wife was also diagnosed with a brain tumor last year-Per recent note it appears she is now on hospice  Most recent visit with myself was in [place] that time he was struggling with severe GERD.  He had an appointment upcoming to see gastroenterology-he saw Dr. Henrene Pastor and underwent an upper GI in February It looks like his upper GI was normal  He then was admitted overnight later in February for a cardiac stent; he had high-grade in-stent restenosis at the RCA He was admitted at [facility] in March with unstable angina, underwent another heart cath and had balloon angioplasty  His most recent outpatient cardiology visit with Dr. Acie Fredrickson was in August 1.  Coronary artery disease:   had repeat PCI at [place] (opened up a previously placed mid RCA stent )  IVUS at the most recent cath revealed calcium between the layers of the mid RCA stent.   [facility] suggested that Shockwave therapy may be beneficial if he needs further PCI to this region [place] is doing very well.  He is not having any episodes of chest pain or shortness of breath.  He is walking quite a bit. 2.  Near syncope:  3.  Hypertension:      Blood pressure is well controlled.  Continue current medications. 4.  Hyperlipidemia:     .  Lipid levels look great.  Continue current medications. 5.  Systolic murmur,     Has mild AI, trivial MR and trivial TR       Recommend updated COVID-19 booster, flu shot  Aspirin Lipitor Plavix HCTZ 12.5 Somatuline Depo injection Prevacid Levothyroxine Losartan Metoprolol Ranexa  Lab Results  Component Value Date   TSH 2.79 09/03/2021   Could be used to check a TSH today, PSA Patient Active Problem List   Diagnosis Date Noted   Secondary malignant neoplasm of bone ([place]) 02/08/2022   Prediabetes 09/03/2021   Near syncope 04/22/2020   Iron deficiency anemia due to chronic blood loss 03/29/2019   Iron malabsorption 03/29/2019   Lumbar radiculopathy 08/16/2017   CAD (coronary artery disease) 09/21/2016   Unstable angina ([place]) 02/09/2016   Vitamin D deficiency 03/11/2015   Metastatic malignant neuroendocrine tumor to liver ([place]) 02/14/2015   Hyperglycemia 04/03/2012   Diarrhea 03/29/2012   B12 deficiency 04/19/2011   Antiplatelet or antithrombotic long-term use 03/30/2011   TOBACCO USE, QUIT 10/13/2009   Unspecified vitamin D deficiency 06/19/2009   Neoplasm by body site 12/23/2008   DIVERTICULOSIS, COLON 11/08/2008   ABDOMINAL PAIN 10/02/2008   Hypothyroidism 12/13/2007   HEMORRHOIDS, NOS 12/13/2007   Belching 12/13/2007   COLONIC POLYPS, HX OF 12/13/2007   Essential hypertension 09/15/2007   Gastroesophageal reflux disease 09/15/2007   PANCREATITIS, CHRONIC 09/15/2007   CARDIAC MURMUR 09/15/2007   SNORING 09/15/2007  Past Medical History:  Diagnosis Date   Anemia    in past   Cataract    Colon polyps    Diverticulosis    Gallstones    GERD (gastroesophageal reflux disease)    Heart attack ([place])    mild, Spring 2017   Heart murmur    Hemorrhoid    Hiatal hernia    HTN (hypertension)    Hyperlipidemia    Hypothyroidism    Iron deficiency anemia due to chronic blood loss 03/29/2019   Iron malabsorption 03/29/2019   Metastatic carcinoma ([place])  2010   Dr Jonette Eva   Neuroendocrine cancer [facility]) 08/2021   Pancreatitis 1998   chronic    Past Surgical History:  Procedure Laterality Date   APPENDECTOMY  1962   CARDIAC CATHETERIZATION N/A 02/10/2016   Procedure: Left Heart Cath and Coronary Angiography;  Surgeon: Adrian Prows, MD;  Location: [place] [facility];  Service: Cardiovascular;  Laterality: N/A;   CARDIAC CATHETERIZATION  02/10/2016   Procedure: Coronary/Graft Atherectomy;  Surgeon: Adrian Prows, MD;  Location: [place] [facility];  Service: Cardiovascular;;   CARDIAC CATHETERIZATION  02/10/2016   Procedure: Coronary Stent Intervention;  Surgeon: Adrian Prows, MD;  Location: [place] [facility];  Service: Cardiovascular;;   CATARACT EXTRACTION W/ INTRAOCULAR LENS  IMPLANT, BILATERAL Bilateral    CORONARY STENT INTERVENTION N/A 12/16/2021   Procedure: CORONARY STENT INTERVENTION;  Surgeon: Early Osmond, MD;  Location: [place] [facility];  Service: Cardiovascular;  Laterality: N/A;   IR GENERIC HISTORICAL  12/30/2015   IR RADIOLOGIST EVAL & MGMT 12/30/2015 Aletta Edouard, MD [facility] INTERV RAD   IR GENERIC HISTORICAL  11/09/2016   IR RADIOLOGIST EVAL & MGMT 11/09/2016 Aletta Edouard, MD [facility] INTERV RAD   IR RADIOLOGIST EVAL & MGMT  03/01/2017   IR RADIOLOGIST EVAL & MGMT  06/29/2017   LAPAROSCOPIC CHOLECYSTECTOMY  1999   LEFT HEART CATH AND CORONARY ANGIOGRAPHY N/A 12/16/2021   Procedure: LEFT HEART CATH AND CORONARY ANGIOGRAPHY;  Surgeon: Early Osmond, MD;  Location: [place] [facility];  Service: Cardiovascular;  Laterality: N/A;   LIVER BIOPSY  2010   RADIOACTIVE SEED IMPLANT  X 3   "to my liver"   TUMOR EXCISION  01/2009   Carcinoil Resection    TUMOR REMOVAL     from small intestine    Social History   Tobacco Use   Smoking status: Former    Packs/day: 1.50    Years: 35.00    Total pack years: 52.50    Types: Cigarettes    Start date: 09/25/1959    Quit date: 12/21/1997    Years since quitting: 24.7   Smokeless tobacco:  Never  Vaping Use   Vaping Use: Never used  Substance Use Topics   Alcohol use: Yes    Alcohol/week: 3.0 - 4.0 standard drinks of alcohol    Types: 3 - 4 Cans of beer per week    Comment: 4 beers weekly    Drug use: No    Family History  Problem Relation Age of Onset   Kidney disease Mother    Hyperlipidemia Mother    Hypertension Mother    COPD Father    Ulcerative colitis Daughter    Colon cancer Neg Hx    Esophageal cancer Neg Hx    Rectal cancer Neg Hx    Stomach cancer Neg Hx     Allergies  Allergen Reactions   Iohexol Hives     Code: HIVES, Desc:  PER [name] @ [facility]IMARY [facility], PT IS ALLERGIC TO CONTRAST DYE 10/02/08/RM  05/01/10...needs full premeds per our protocol w/ [facility]., Onset Date: [id number]    Iodinated Contrast Media Hives    Medication list has been reviewed and updated.  Current Outpatient Medications on File Prior to Visit  Medication Sig Dispense Refill   Artificial Tear Solution (SOOTHE XP OP) Place 1 drop into both eyes daily as needed (dry eyes).     aspirin EC 81 MG tablet Take 81 mg by mouth every morning.     atorvastatin (LIPITOR) 80 MG tablet Take 1 tablet (80 mg total) by mouth daily. 90 tablet 1   Cholecalciferol 1000 UNITS tablet Take 1,000 Units by mouth 2 (two) times daily.     clopidogrel (PLAVIX) 75 MG tablet TAKE 1 TABLET BY MOUTH EVERY DAY 90 tablet 3   diphenhydramine-acetaminophen (TYLENOL PM) 25-500 MG TABS tablet Take 1 tablet by mouth at bedtime as needed (sleep/pain).     fluticasone (FLONASE) 50 MCG/ACT nasal spray Place 2 sprays into both nostrils daily as needed (sinuses).   11   hydrochlorothiazide (HYDRODIURIL) 25 MG tablet Take 0.5 tablets (12.5 mg total) by mouth daily. 45 tablet 3   hydrocortisone (ANUSOL-HC) 25 MG suppository INSERT 1 SUPPOSITORY RECTALLY TWICE A DAY AS NEEDED FOR HEMORRHOIDS 30 suppository 0   ipratropium (ATROVENT) 0.03 % nasal spray Place 2 sprays into the nose 3 (three) times daily. 30 mL 6   Lanreotide  Acetate (SOMATULINE DEPOT [place]) Inject 120 mcg into the skin every 28 (twenty-eight) days. Receives at Dr Antonieta Pert office     lansoprazole (PREVACID) 30 MG capsule TAKE 1 CAPSULE (30 MG TOTAL) BY MOUTH DAILY AT 12 NOON. 90 capsule 3   levothyroxine (SYNTHROID) 50 MCG tablet TAKE 1 TABLET BY MOUTH DAILY BEFORE BREAKFAST 90 tablet 0   losartan (COZAAR) 50 MG tablet Take 1 tablet (50 mg total) by mouth daily. 90 tablet 3   metoprolol tartrate (LOPRESSOR) 25 MG tablet Take 0.5 tablets (12.5 mg total) by mouth 2 (two) times daily. 90 tablet 3   nitroGLYCERIN (NITROSTAT) 0.4 MG SL tablet PLACE 1 TABLET UNDER THE TONGUE EVERY 5 (FIVE) MINUTES X 3 DOSES AS NEEDED FOR CHEST PAIN. (Patient not taking: Reported on 07/14/2022) 25 tablet 4   polyethylene glycol (MIRALAX / GLYCOLAX) 17 g packet Take 17 g by mouth daily.     ranolazine (RANEXA) 500 MG 12 hr tablet Take 1 tablet (500 mg total) by mouth 2 (two) times daily. 180 tablet 3   vitamin B-12 (CYANOCOBALAMIN) 1000 MCG tablet Take 1,000 mcg by mouth every other day.     No current facility-administered medications on file prior to visit.    Review of Systems:  As per HPI- otherwise negative.   Physical Examination: There were no vitals filed for this visit. There were no vitals filed for this visit. There is no height or weight on file to calculate BMI. Ideal Body Weight:    GEN: no acute distress. HEENT: Atraumatic, Normocephalic.  Ears and Nose: No external deformity. CV: RRR, No M/G/R. No JVD. No thrill. No extra heart sounds. PULM: CTA B, no wheezes, crackles, rhonchi. No retractions. No resp. distress. No accessory muscle use. ABD: S, NT, ND, +BS. No rebound. No HSM. EXTR: No c/c/e PSYCH: Normally interactive. Conversant.    Assessment and Plan: ***  Signed Lamar Blinks, MD

## 2022-09-07 NOTE — Patient Instructions (Incomplete)
It was good to see you again today, I am sorry things have been so hard Please let me know what I can do to help I will touch base with Dr Acie Fredrickson about your BP; likely we can reduce some of your medication  Assuming all is well please see me in about 6 months

## 2022-09-08 ENCOUNTER — Encounter: Payer: Self-pay | Admitting: Family Medicine

## 2022-09-08 ENCOUNTER — Ambulatory Visit (INDEPENDENT_AMBULATORY_CARE_PROVIDER_SITE_OTHER): Payer: Medicare HMO | Admitting: Family Medicine

## 2022-09-08 ENCOUNTER — Telehealth: Payer: Self-pay | Admitting: Radiation Therapy

## 2022-09-08 VITALS — BP 120/65 | HR 60 | Temp 97.7°F | Resp 18 | Ht 70.0 in | Wt 178.2 lb

## 2022-09-08 DIAGNOSIS — Z23 Encounter for immunization: Secondary | ICD-10-CM | POA: Diagnosis not present

## 2022-09-08 DIAGNOSIS — I10 Essential (primary) hypertension: Secondary | ICD-10-CM

## 2022-09-08 DIAGNOSIS — J3489 Other specified disorders of nose and nasal sinuses: Secondary | ICD-10-CM

## 2022-09-08 DIAGNOSIS — Z125 Encounter for screening for malignant neoplasm of prostate: Secondary | ICD-10-CM

## 2022-09-08 DIAGNOSIS — E039 Hypothyroidism, unspecified: Secondary | ICD-10-CM | POA: Diagnosis not present

## 2022-09-08 MED ORDER — IPRATROPIUM BROMIDE 0.03 % NA SOLN: 2.0000 | Freq: Three times a day (TID) | NASAL | 6 refills | Status: DC

## 2022-09-08 NOTE — Telephone Encounter (Signed)
Spoke with pt about his upcoming thoracic and lumbar spine MRI scans with telephone follow-up in January 2024. He has already seen these pop up in [place] and plans to attend. He is also aware the the follow-up with [name] is a telephone follow-up, not an in person visit.   Mont Dutton R.T.(R)(T) Radiation Special Procedures Navigator

## 2022-09-09 ENCOUNTER — Encounter: Payer: Self-pay | Admitting: Family Medicine

## 2022-09-09 LAB — TSH: TSH: 3.09 u[IU]/mL (ref 0.35–5.50)

## 2022-09-09 LAB — PSA: PSA: 0.37 ng/mL (ref 0.10–4.00)

## 2022-09-17 ENCOUNTER — Inpatient Hospital Stay: Payer: Medicare HMO

## 2022-09-17 ENCOUNTER — Inpatient Hospital Stay: Payer: Medicare HMO | Attending: Hematology & Oncology

## 2022-09-17 ENCOUNTER — Inpatient Hospital Stay: Payer: Medicare HMO | Admitting: Family

## 2022-09-17 ENCOUNTER — Encounter: Payer: Self-pay | Admitting: Family

## 2022-09-17 ENCOUNTER — Other Ambulatory Visit: Payer: Self-pay

## 2022-09-17 VITALS — BP 121/51 | HR 56 | Temp 98.0°F | Resp 18 | Ht 70.0 in | Wt 180.0 lb

## 2022-09-17 DIAGNOSIS — C7B8 Other secondary neuroendocrine tumors: Secondary | ICD-10-CM

## 2022-09-17 DIAGNOSIS — C7A8 Other malignant neuroendocrine tumors: Secondary | ICD-10-CM | POA: Insufficient documentation

## 2022-09-17 DIAGNOSIS — D500 Iron deficiency anemia secondary to blood loss (chronic): Secondary | ICD-10-CM

## 2022-09-17 LAB — CBC WITH DIFFERENTIAL (CANCER CENTER ONLY)
Abs Immature Granulocytes: 0.02 10*3/uL (ref 0.00–0.07)
Basophils Absolute: 0 10*3/uL (ref 0.0–0.1)
Basophils Relative: 1 %
Eosinophils Absolute: 0.4 10*3/uL (ref 0.0–0.5)
Eosinophils Relative: 7 %
HCT: 34.5 % — ABNORMAL LOW (ref 39.0–52.0)
Hemoglobin: 11.1 g/dL — ABNORMAL LOW (ref 13.0–17.0)
Immature Granulocytes: 0 %
Lymphocytes Relative: 16 %
Lymphs Abs: 0.9 10*3/uL (ref 0.7–4.0)
MCH: 30.3 pg (ref 26.0–34.0)
MCHC: 32.2 g/dL (ref 30.0–36.0)
MCV: 94.3 fL (ref 80.0–100.0)
Monocytes Absolute: 0.4 10*3/uL (ref 0.1–1.0)
Monocytes Relative: 7 %
Neutro Abs: 3.6 10*3/uL (ref 1.7–7.7)
Neutrophils Relative %: 69 %
Platelet Count: 125 10*3/uL — ABNORMAL LOW (ref 150–400)
RBC: 3.66 MIL/uL — ABNORMAL LOW (ref 4.22–5.81)
RDW: 14.6 % (ref 11.5–15.5)
WBC Count: 5.3 10*3/uL (ref 4.0–10.5)
nRBC: 0 % (ref 0.0–0.2)

## 2022-09-17 LAB — CMP (CANCER CENTER ONLY)
ALT: 12 U/L (ref 0–44)
AST: 20 U/L (ref 15–41)
Albumin: 3.8 g/dL (ref 3.5–5.0)
Alkaline Phosphatase: 50 U/L (ref 38–126)
Anion gap: 4 — ABNORMAL LOW (ref 5–15)
BUN: 20 mg/dL (ref 8–23)
CO2: 32 mmol/L (ref 22–32)
Calcium: 9.5 mg/dL (ref 8.9–10.3)
Chloride: 103 mmol/L (ref 98–111)
Creatinine: 1.07 mg/dL (ref 0.61–1.24)
GFR, Estimated: >60 mL/min (ref >60)
Glucose, Bld: 124 mg/dL — ABNORMAL HIGH (ref 70–99)
Potassium: 5.1 mmol/L (ref 3.5–5.1)
Sodium: 139 mmol/L (ref 135–145)
Total Bilirubin: 0.8 mg/dL (ref 0.3–1.2)
Total Protein: 6.8 g/dL (ref 6.5–8.1)

## 2022-09-17 LAB — LACTATE DEHYDROGENASE: LDH: 164 U/L (ref 98–192)

## 2022-09-17 MED ORDER — LANREOTIDE ACETATE 120 MG/0.5ML ~~LOC~~ SOLN
120.0000 mg | Freq: Once | SUBCUTANEOUS | Status: AC
  Administered 2022-09-17: 120 mg via SUBCUTANEOUS
  Filled 2022-09-17: qty 120

## 2022-09-17 NOTE — Progress Notes (Signed)
Hematology and Oncology Follow Up Visit  Brian Mays [id number] September 10, 1946 76 y.o. 09/17/2022   Principle Diagnosis:  S/p yttrium-90 intrahepatic therapy - November 2016 Lutathera (Lu 177) injection on 02/01/2018 - s/p cycle 4 SBRT to T6 lesion -- 11/06/2021   Current Therapy:        Somatuline 120 mg monthly IV Iron as indicated  --Injectafer given on 01/2021   Interim History:  Brian Mays is here today for follow-up and treatment. He is doing fairly well. He notes some fatigue and states that he does not always sleep well due to having a lot on his mind.  His wife is receiving hospice care at home. He is her main caregiver but also has help from his daughter.  He tries to go for a walk or gold a few holes when he can for exercise.  Chromogranin A 118.  No issue with infections. No fever, chills, n/v, cough, rash, dizziness, SOB, chest pain, palpitations, abdominal pain or changes in bowel or bladder habits.  No blood loss, abnormal bruising or petechia noted.  No swelling, tenderness in his extremities.  No falls or syncope.  Appetite and hydration are good. Weight is stable at 180 lbs.   ECOG Performance Status: 1 - Symptomatic but completely ambulatory  Medications:  Allergies as of 09/17/2022       Reactions   Iohexol Hives    Code: HIVES, Desc: PER [name] @ [facility]IMARY [facility]ARE, PT IS ALLERGIC TO CONTRAST DYE 10/02/08/RM  05/01/10...needs full premeds per our protocol w/ [facility]., Onset Date: [id number]   Iodinated Contrast Media Hives        Medication List        Accurate as of September 17, 2022  1:48 PM. If you have any questions, ask your nurse or doctor.          STOP taking these medications    polyethylene glycol 17 g packet Commonly known as: MIRALAX / GLYCOLAX Stopped by: Lottie Dawson, NP       TAKE these medications    aspirin EC 81 MG tablet Take 81 mg by mouth every morning.   atorvastatin 80 MG tablet Commonly known as: LIPITOR Take 1 tablet  (80 mg total) by mouth daily.   Cholecalciferol 25 MCG (1000 UT) tablet Take 1,000 Units by mouth 2 (two) times daily.   clopidogrel 75 MG tablet Commonly known as: PLAVIX TAKE 1 TABLET BY MOUTH EVERY DAY   cyanocobalamin 1000 MCG tablet Commonly known as: VITAMIN B12 Take 1,000 mcg by mouth every other day.   diphenhydramine-acetaminophen 25-500 MG Tabs tablet Commonly known as: TYLENOL PM Take 1 tablet by mouth at bedtime as needed (sleep/pain).   fluticasone 50 MCG/ACT nasal spray Commonly known as: FLONASE Place 2 sprays into both nostrils daily as needed (sinuses).   hydrocortisone 25 MG suppository Commonly known as: ANUSOL-HC INSERT 1 SUPPOSITORY RECTALLY TWICE A DAY AS NEEDED FOR HEMORRHOIDS   ipratropium 0.03 % nasal spray Commonly known as: Atrovent Place 2 sprays into the nose 3 (three) times daily. Use as needed for nasal drip   lansoprazole 30 MG capsule Commonly known as: PREVACID TAKE 1 CAPSULE (30 MG TOTAL) BY MOUTH DAILY AT 12 NOON.   levothyroxine 50 MCG tablet Commonly known as: SYNTHROID TAKE 1 TABLET BY MOUTH DAILY BEFORE BREAKFAST   losartan 50 MG tablet Commonly known as: COZAAR Take 1 tablet (50 mg total) by mouth daily.   metoprolol tartrate 25 MG tablet Commonly known as: LOPRESSOR Take  0.5 tablets (12.5 mg total) by mouth 2 (two) times daily.   nitroGLYCERIN 0.4 MG SL tablet Commonly known as: NITROSTAT PLACE 1 TABLET UNDER THE TONGUE EVERY 5 (FIVE) MINUTES X 3 DOSES AS NEEDED FOR CHEST PAIN.   ranolazine 500 MG 12 hr tablet Commonly known as: RANEXA Take 1 tablet (500 mg total) by mouth 2 (two) times daily.   SOMATULINE DEPOT [place] Inject 120 mcg into the skin every 28 (twenty-eight) days. Receives at Dr Antonieta Pert office   SOOTHE XP OP Place 1 drop into both eyes daily as needed (dry eyes).        Allergies:  Allergies  Allergen Reactions   Iohexol Hives     Code: HIVES, Desc: PER [name] @ [facility]IMARY [facility], PT IS ALLERGIC TO  CONTRAST DYE 10/02/08/RM  05/01/10...needs full premeds per our protocol w/ [facility]., Onset Date: [id number]    Iodinated Contrast Media Hives    Past Medical History, Surgical history, Social history, and Family History were reviewed and updated.  Review of Systems: All other 10 point review of systems is negative.   Physical Exam:  height is '5\' 10"'$  (1.778 m) and weight is 180 lb 0.6 oz (81.7 kg). His oral temperature is 98 F (36.7 C). His blood pressure is 121/51 (abnormal) and his pulse is 56 (abnormal). His respiration is 18 and oxygen saturation is 100%.   Wt Readings from Last 3 Encounters:  09/17/22 180 lb 0.6 oz (81.7 kg)  09/08/22 178 lb 3.2 oz (80.8 kg)  08/16/22 180 lb 1.9 oz (81.7 kg)    Ocular: Sclerae unicteric, pupils equal, round and reactive to light Ear-nose-throat: Oropharynx clear, dentition fair Lymphatic: No cervical or supraclavicular adenopathy Lungs no rales or rhonchi, good excursion bilaterally Heart regular rate and rhythm, no murmur appreciated Abd soft, nontender, positive bowel sounds MSK no focal spinal tenderness, no joint edema Neuro: non-focal, well-oriented, appropriate affect Breasts: Deferred   Lab Results  Component Value Date   WBC 5.3 09/17/2022   HGB 11.1 (L) 09/17/2022   HCT 34.5 (L) 09/17/2022   MCV 94.3 09/17/2022   PLT 125 (L) 09/17/2022   Lab Results  Component Value Date   FERRITIN 113 06/14/2022   IRON 60 06/14/2022   TIBC 280 06/14/2022   UIBC 220 06/14/2022   IRONPCTSAT 21 06/14/2022   Lab Results  Component Value Date   RETICCTPCT 1.2 06/14/2022   RBC 3.66 (L) 09/17/2022   No results found for: "KPAFRELGTCHN", "LAMBDASER", "KAPLAMBRATIO" No results found for: "IGGSERUM", "IGA", "IGMSERUM" No results found for: "TOTALPROTELP", "ALBUMINELP", "A1GS", "A2GS", "BETS", "BETA2SER", "GAMS", "MSPIKE", "SPEI"   Chemistry      Component Value Date/Time   NA 143 08/16/2022 1001   NA 141 06/22/2022 0942   NA 144  10/04/2017 0904   NA 141 07/23/2016 1255   K 4.2 08/16/2022 1001   K 3.8 10/04/2017 0904   K 4.1 07/23/2016 1255   CL 104 08/16/2022 1001   CL 103 10/04/2017 0904   CO2 32 08/16/2022 1001   CO2 28 10/04/2017 0904   CO2 27 07/23/2016 1255   BUN 21 08/16/2022 1001   BUN 20 06/22/2022 0942   BUN 14 10/04/2017 0904   BUN 13.8 07/23/2016 1255   CREATININE 1.09 08/16/2022 1001   CREATININE 0.8 10/04/2017 0904   CREATININE 1.0 07/23/2016 1255      Component Value Date/Time   CALCIUM 9.9 08/16/2022 1001   CALCIUM 9.4 10/04/2017 0904   CALCIUM 9.2 07/23/2016 1255  ALKPHOS 50 08/16/2022 1001   ALKPHOS 64 10/04/2017 0904   ALKPHOS 77 07/23/2016 1255   AST 19 08/16/2022 1001   AST 23 07/23/2016 1255   ALT 12 08/16/2022 1001   ALT 22 10/04/2017 0904   ALT 13 07/23/2016 1255   BILITOT 0.8 08/16/2022 1001   BILITOT 1.13 07/23/2016 1255       Impression and Plan: Mr. Fix is a very pleasant 76 yo caucasian gentleman with metastatic low-grade neuroendocrine carcinoma with hepatic metastasis.   Chromogranin A pending.  Somatuline given.  Follow-up in 1 month.   Lottie Dawson, NP 11/17/20231:48 PM

## 2022-09-17 NOTE — Patient Instructions (Signed)
Lanreotide Injection What is this medication? LANREOTIDE (lan REE oh tide) treats high levels of growth hormone (acromegaly). It is used when other therapies have not worked well enough or cannot be tolerated. It works by reducing the amount of growth hormone your body makes. This reduces symptoms and the risk of health problems caused by too much growth hormone, such as diabetes and heart disease. It may also be used to treat neuroendocrine tumors, a cancer of the cells that release hormones and other substances in your body. It works by slowing down the release of these substances from the cells. This slows tumor growth. It also decreases the symptoms of carcinoid syndrome, such as flushing or diarrhea. This medicine may be used for other purposes; ask your health care provider or pharmacist if you have questions. COMMON BRAND NAME(S): Somatuline [place] What should I tell my care team before I take this medication? They need to know if you have any of these conditions: Diabetes Gallbladder disease Heart disease Kidney disease Liver disease Thyroid disease An unusual or allergic reaction to lanreotide, other medications, foods, dyes, or preservatives Pregnant or trying to get pregnant Breast-feeding How should I use this medication? This medication is injected under the skin. It is given by your care team in a hospital or clinic setting. Talk to your care team about the use of this medication in children. Special care may be needed. Overdosage: If you think you have taken too much of this medicine contact a poison control center or emergency room at once. NOTE: This medicine is only for you. Do not share this medicine with others. What if I miss a dose? Keep appointments for follow-up doses. It is important not to miss your dose. Call your care team if you are unable to keep an appointment. What may interact with this medication? Bromocriptine Cyclosporine Certain medications for blood  pressure, heart disease, irregular heartbeat Certain medications for diabetes Quinidine Terfenadine This list may not describe all possible interactions. Give your health care provider a list of all the medicines, herbs, non-prescription drugs, or dietary supplements you use. Also tell them if you smoke, drink alcohol, or use illegal drugs. Some items may interact with your medicine. What should I watch for while using this medication? Visit your care team for regular checks on your progress. Tell your care team if your symptoms do not start to get better or if they get worse. Your condition will be monitored carefully while you are receiving this medication. You may need blood work while you are taking this medication. This medication may increase blood sugar. The risk may be higher in patients who already have diabetes. Ask your care team what you can do to lower your risk of diabetes while taking this medication. Talk to your care team if you wish to become pregnant or think you may be pregnant. This medication can cause serious birth defects. Do not breast-feed while taking this medication and for 6 months after stopping therapy. This medication may cause infertility. Talk to your care team if you are concerned about your fertility. What side effects may I notice from receiving this medication? Side effects that you should report to your care team as soon as possible: Allergic reactions--skin rash, itching, hives, swelling of the face, lips, tongue, or throat Gallbladder problems--severe stomach pain, nausea, vomiting, fever High blood sugar (hyperglycemia)--increased thirst or amount of urine, unusual weakness or fatigue, blurry vision Increase in blood pressure Low blood sugar (hypoglycemia)--tremors or shaking, anxiety, sweating, cold   or clammy skin, confusion, dizziness, rapid heartbeat Low thyroid levels (hypothyroidism)--unusual weakness or fatigue, increased sensitivity to cold,  constipation, hair loss, dry skin, weight gain, feelings of depression Slow heartbeat--dizziness, feeling faint or lightheaded, confusion, trouble breathing, unusual weakness or fatigue Side effects that usually do not require medical attention (report to your care team if they continue or are bothersome): Diarrhea Dizziness Headache Muscle spasms Nausea Pain, redness, irritation, or bruising at the injection site Stomach pain This list may not describe all possible side effects. Call your doctor for medical advice about side effects. You may report side effects to FDA at 1-800-FDA-1088. Where should I keep my medication? This medication is given in a hospital or clinic. It will not be stored at home. NOTE: This sheet is a summary. It may not cover all possible information. If you have questions about this medicine, talk to your doctor, pharmacist, or health care provider.  2023 Elsevier/Gold Standard (2021-12-18 00:00:00)  

## 2022-09-20 LAB — CHROMOGRANIN A: Chromogranin A (ng/mL): 163 ng/mL — ABNORMAL HIGH (ref 0.0–101.8)

## 2022-09-30 ENCOUNTER — Encounter: Payer: Self-pay | Admitting: Internal Medicine

## 2022-09-30 ENCOUNTER — Ambulatory Visit (INDEPENDENT_AMBULATORY_CARE_PROVIDER_SITE_OTHER): Payer: Medicare HMO | Admitting: Internal Medicine

## 2022-09-30 VITALS — BP 120/66 | HR 58 | Temp 97.8°F | Resp 14 | Ht 70.0 in | Wt 181.8 lb

## 2022-09-30 DIAGNOSIS — S39012A Strain of muscle, fascia and tendon of lower back, initial encounter: Secondary | ICD-10-CM

## 2022-09-30 DIAGNOSIS — S46912A Strain of unspecified muscle, fascia and tendon at shoulder and upper arm level, left arm, initial encounter: Secondary | ICD-10-CM | POA: Diagnosis not present

## 2022-09-30 NOTE — Progress Notes (Signed)
   Acute Office Visit  Subjective:     Patient ID: Brian Mays, male    DOB: 1946-02-06, 76 y.o.   MRN: [id number]  Chief Complaint  Patient presents with   Left shoulder pain    Back pain on left side near shoulder blade and left upper arm pain for a few weeks.    HPI Patient is in today for left shoulder blade and left upper arm pain.  He lifts on his wife who is bedbound from brain tumor b and he has to lift and roll her to change her.  He doesn't recall injuring it but sitting in dental chair made it hurt in back and putting arm behind low back on left hurts posterior deltoid/proximal triceps, which is reproducible pain.      Objective:    BP 120/66 (BP Location: Left Arm, Patient Position: Sitting)   Pulse (!) 58   Temp 97.8 F (36.6 C) (Temporal)   Resp 14   Ht '5\' 10"'$  (1.778 m)   Wt 181 lb 12.8 oz (82.5 kg)   SpO2 95%   BMI 26.09 kg/m  BP Readings from Last 3 Encounters:  09/30/22 120/66  09/17/22 (!) 121/51  09/08/22 120/65      Physical Exam Vitals and nursing note reviewed.  Constitutional:      General: He is not in acute distress.    Appearance: Normal appearance. He is well-developed. He is not ill-appearing or toxic-appearing.  HENT:     Head: Normocephalic and atraumatic.     Nose: Nose normal.  Eyes:     General: No scleral icterus.    Conjunctiva/sclera: Conjunctivae normal.  Pulmonary:     Effort: Pulmonary effort is normal.  Musculoskeletal:        General: No swelling, tenderness, deformity or signs of injury.       Arms:     Comments: Pain is limited to only the areas specified by the red circles.  The paraspinal pain is worsened by dental chair, while he can reproducibly worsen the posterior deltoid pain on the left by putting that arm behind his back  Neurological:     Mental Status: He is alert. Mental status is at baseline.  Psychiatric:        Mood and Affect: Mood normal.        Behavior: Behavior normal.        Thought Content:  Thought content normal.        Judgment: Judgment normal.        Assessment & Plan:   Problem List Items Addressed This Visit   None Visit Diagnoses     Back strain, initial encounter    -  Primary   Relevant Orders   Ambulatory referral to Physical Therapy   Muscle strain of left upper arm, initial encounter       Relevant Orders   Ambulatory referral to Physical Therapy       I feel that this is most likely repetitive motion strain of the indicated muscles due to his caring for his wife.  Recommended taking a break from that using respite care or changing his position.  Addon ice and I also suggested salonpas and naproxen OTC.  Tried to get him in with physical therapy after he accepted the offer.  Offered EKG/labs declined based on presumed noncardiac pain.  Loralee Pacas, MD

## 2022-09-30 NOTE — Patient Instructions (Addendum)
It was a pleasure seeing you today!  Your health and satisfaction are my top priorities. If you believe your experience today was worthy of a 5-star rating, I'd be grateful for your feedback! Loralee Pacas, MD   AT CHECKOUT:   '[]'$    Sign release of information at the check out desk for: Any records we need for your care and to be your medical home  '[]'$    Schedule next appointment:  No follow-ups on file.  If you are not doing well:  Return to the office sooner  Please bring all your medicines to each appointment If your condition begins to worsen or become severe:  GO to the ER   CLINICAL PLAN REMINDERS: Your checklist to help you remember today's clinical plan  '[]'$    Rest ice, see about hospice respite services, maybe a shoulder brace, and meet with our physical therapist.  '[]'$   (Optional):  Review your clinical notes on MyChart after they are completed.     Repetitive Strain Injuries Repetitive strain injuries (RSIs) result from the overuse or misuse of muscles, nerves, or tissues that connect muscle to bone (tendons). RSIs can affect almost any part of the body, but they are most common in: The arms and hands, including the thumbs, wrists, elbows, and shoulders. The legs, including the ankles and knees. Repetitive strain often causes certain common medical conditions, such as: Carpal tunnel syndrome and trigger finger in the upper limbs and hands. Tendonitis in the lower limbs. What are the causes? RSIs are caused by repeating the same activity for long time periods without enough rest. Repetitive strain often results from activities done: At work, such as typing for long time periods. During a hobby, such as painting, knitting, or sewing. What increases the risk? Certain workplace and personal factors may make you more likely to develop an RSI. Workplace risk factors Frequent computer use. Not taking rest breaks. Doing motions over and over again. Working in an awkward  position or holding the same position for a long time. Forceful movements, such as lifting, pulling, or pushing. Exposure to the vibration caused by using power tools. Working in cold temperatures. Personal risk factors Having poor posture. Not exercising regularly. Being overweight. Having long-term (chronic) medical conditions, such as arthritis, diabetes, or thyroid problems. Having low levels (deficiencies) of certain vitamins. What are the signs or symptoms? You may feel these symptoms at the affected body part: Burning, shooting, or aching pain. Tenderness or swelling. Tingling or numbness. Weakness, heaviness, or loss of coordination. Tiredness (fatigue). Sudden, automatic muscle tightening (spasms) or muscle stiffness. Difficulty moving. How is this diagnosed? An RSI may be diagnosed with a physical exam and an evaluation of your everyday activities. You may have tests, including: X-rays. Electromyogram (EMG). This test measures electrical signals that your nerves send to your muscles. Diagnostic ultrasound. This test uses sound waves to create images showing tendon thickness. How is this treated? Treatment depends on the severity and type of RSI you have. Treatment may include: Rest. Ice or heat therapy. NSAIDS to reduce pain and swelling. Steroid injections. Wearing a wrap that applies pressure (compression bandage) or wearing a splint. Having occupational or physical therapy. Having surgery. RSIs often take weeks or months to heal, depending on your condition. Follow these instructions at home: If you have a splint: Wear the splint as told by your health care provider. Remove it only as told by your health care provider. Check the skin around the splint every day. Tell your  health care provider about any concerns. Loosen the splint if your fingers or toes tingle, become numb, or turn cold and blue. Keep the splint clean. Bathing If you have a splint that is not  waterproof: Do not let it get wet. Cover it with a watertight covering when you take a bath or shower. Managing pain, stiffness, and swelling     If directed, put ice on the injured area. To do this: If you have a removable splint, remove it as told by your health care provider. Put ice in a plastic bag. Place a towel between your skin and the bag. Leave the ice on for 20 minutes, 2-3 times a day. Remove the ice if your skin turns bright red. This is very important. If you cannot feel pain, heat, or cold, you have a greater risk of damage to the area. If directed, apply heat to the affected area as often as told by your health care provider. Use the heat source that your health care provider recommends, such as a moist heat pack or a heating pad. Place a towel between your skin and the heat source. Leave the heat on for 20-30 minutes. Remove the heat if your skin turns bright red. This is especially important if you are unable to feel pain, heat, or cold. You have a greater risk of getting burned. If your arm or leg is affected, move your fingers or toes often to reduce stiffness and swelling. If possible, raise (elevate) the injured area above the level of your heart while you are sitting or lying down. Activity Rest your affected body part as told by your health care provider. Stop or adjust activities that cause your symptoms or make them worse. Return to your normal activities as told by your health care provider. Ask your health care provider what activities are safe for you. Do exercises as told by your health care provider. General instructions Take over-the-counter and prescription medicines only as told by your health care provider. Wear a compression bandage as told by your health care provider. If your condition is work-related, talk to your employer about changes that can be made. Keep all follow-up visits. This is important. How is this prevented? Maintain good  posture Good posture means that: Your spine is in its natural position. Your feet are flat on the floor or supported on a footrest. Your knees are directly over your feet. Your arms are relaxed and at your sides. Your neck is relaxed and not bent forward or backward. Adjust your workstation Arrange your workstation so that you can maintain good posture. To do this: Sit in a chair that supports the natural curve of your lower back. Slide your chair under your desk so that you are close enough to reach your keyboard and computer mouse while: Your elbows are at your side and bent at a right angle. Your forearms are parallel to the ground. Have your computer monitor directly in front of you so that your eyes are level with the top of the screen. The screen should be about 15-25 inches (38.1-63.5 cm) from your eyes.  Other tips Take breaks often from any repeated activity. Alternate with another task that requires you to use different muscles. Change positions regularly. Exercise regularly. Maintain a healthy weight. Contact a health care provider if: You develop new symptoms. Your symptoms get worse or do not improve with medicine. Summary Repetitive strain injuries (RSIs) result from the overuse or misuse of muscles, nerves, or tissues that  connect muscle to bone (tendons). Symptoms include pain, burning, swelling, stiffness, weakness, numbness, or spasms in the affected area. These injuries may be treated with rest, ice, heat, medicines, physical or occupational therapy, splinting, and surgery if needed. This information is not intended to replace advice given to you by your health care provider. Make sure you discuss any questions you have with your health care provider. Document Revised: 06/16/2021 Document Reviewed: 06/16/2021 Elsevier Patient Education  [place] documented plan for today's visit: (final revisions will be visible on  MyChart chart later) Back strain, initial encounter  Muscle strain of left upper arm, initial encounter      QUESTIONS & CONCERNS: CLINICAL: please contact me via phone [phone] OR MyChart messaging  LAB & IMAGING:   We will call you if the results are significantly abnormal or you don't use MyChart.  Most normal results will be posted to MyChart immediately and have a clinical review message by Dr. Randol Kern posted within 2-3 business days.   If you have not heard from [place] regarding the results in 2 weeks OR if you need priority reporting, please contact this office. MYCHART:  The fastest way to get your results and easiest way to stay in touch with [place] is by activating your My Chart account. Instructions are located on the last page of this paperwork.  BILLING: xray and lab orders are billed from separate companies and questions./concerns should be directed to the [place].  For visit charges please discuss with our administrative services COMPLAINTS:  please let Dr. Randol Kern know or see the [facility], by asking at the front desk: we want you to be satisfied with every experience and we would be grateful for the opportunity to address any problems

## 2022-10-05 ENCOUNTER — Encounter: Payer: Self-pay | Admitting: Physical Therapy

## 2022-10-05 ENCOUNTER — Ambulatory Visit (INDEPENDENT_AMBULATORY_CARE_PROVIDER_SITE_OTHER): Payer: Medicare HMO | Admitting: Physical Therapy

## 2022-10-05 DIAGNOSIS — M25512 Pain in left shoulder: Secondary | ICD-10-CM | POA: Diagnosis not present

## 2022-10-05 DIAGNOSIS — M898X1 Other specified disorders of bone, shoulder: Secondary | ICD-10-CM

## 2022-10-05 DIAGNOSIS — M6281 Muscle weakness (generalized): Secondary | ICD-10-CM | POA: Diagnosis not present

## 2022-10-05 NOTE — Therapy (Signed)
OUTPATIENT PHYSICAL THERAPY SHOULDER EVALUATION   Patient Name:  Brian Mays MRN: [id number] DOB:May 23, 1946, 76 y.o., male Today's Date: 10/05/2022  END OF SESSION:  PT End of Session - 10/05/22 1330     Visit Number 1    Number of Visits 16    Date for PT Re-Evaluation 11/30/22    Authorization Type aetna medicare, VL 30    Authorization - Visit Number 1    Authorization - Number of Visits 30    Progress Note Due on Visit 10    PT Start Time 4166    PT Stop Time 1425    PT Time Calculation (min) 45 min    Activity Tolerance Patient tolerated treatment well             Past Medical History:  Diagnosis Date   Anemia    in past   Cataract    Colon polyps    Diverticulosis    Gallstones    GERD (gastroesophageal reflux disease)    Heart attack ([place])    mild, Spring 2017   Heart murmur    Hemorrhoid    Hiatal hernia    HTN (hypertension)    Hyperlipidemia    Hypothyroidism    Iron deficiency anemia due to chronic blood loss 03/29/2019   Iron malabsorption 03/29/2019   Metastatic carcinoma ([place]) 2010   Dr Jonette Eva   Neuroendocrine cancer [facility]) 08/2021   Pancreatitis 1998   chronic   Past Surgical History:  Procedure Laterality Date   APPENDECTOMY  1962   CARDIAC CATHETERIZATION N/A 02/10/2016   Procedure: Left Heart Cath and Coronary Angiography;  Surgeon: Adrian Prows, MD;  Location: [facility];  Service: Cardiovascular;  Laterality: N/A;   CARDIAC CATHETERIZATION  02/10/2016   Procedure: Coronary/Graft Atherectomy;  Surgeon: Adrian Prows, MD;  Location: [facility];  Service: Cardiovascular;;   CARDIAC CATHETERIZATION  02/10/2016   Procedure: Coronary Stent Intervention;  Surgeon: Adrian Prows, MD;  Location: [facility];  Service: Cardiovascular;;   CATARACT EXTRACTION W/ INTRAOCULAR LENS  IMPLANT, BILATERAL Bilateral    CORONARY STENT INTERVENTION N/A 12/16/2021   Procedure: CORONARY STENT INTERVENTION;  Surgeon: Early Osmond, MD;  Location:  [facility];  Service: Cardiovascular;  Laterality: N/A;   IR GENERIC HISTORICAL  12/30/2015   IR RADIOLOGIST EVAL & MGMT 12/30/2015 Aletta Edouard, MD [facility] INTERV RAD   IR GENERIC HISTORICAL  11/09/2016   IR RADIOLOGIST EVAL & MGMT 11/09/2016 Aletta Edouard, MD [facility] INTERV RAD   IR RADIOLOGIST EVAL & MGMT  03/01/2017   IR RADIOLOGIST EVAL & MGMT  06/29/2017   LAPAROSCOPIC CHOLECYSTECTOMY  1999   LEFT HEART CATH AND CORONARY ANGIOGRAPHY N/A 12/16/2021   Procedure: LEFT HEART CATH AND CORONARY ANGIOGRAPHY;  Surgeon: Early Osmond, MD;  Location: [facility];  Service: Cardiovascular;  Laterality: N/A;   LIVER BIOPSY  2010   RADIOACTIVE SEED IMPLANT  X 3   "to my liver"   TUMOR EXCISION  01/2009   Carcinoil Resection    TUMOR REMOVAL     from small intestine   Patient Active Problem List   Diagnosis Date Noted   Secondary malignant neoplasm of bone ([place]) 02/08/2022   Prediabetes 09/03/2021   Near syncope 04/22/2020   Iron deficiency anemia due to chronic blood loss 03/29/2019   Iron malabsorption 03/29/2019   Lumbar radiculopathy 08/16/2017   CAD (coronary artery disease) 09/21/2016   Unstable angina ([place]) 02/09/2016   Vitamin  D deficiency 03/11/2015   Metastatic malignant neuroendocrine tumor to liver ([place]) 02/14/2015   Hyperglycemia 04/03/2012   Diarrhea 03/29/2012   B12 deficiency 04/19/2011   Antiplatelet or antithrombotic long-term use 03/30/2011   TOBACCO USE, QUIT 10/13/2009   Unspecified vitamin D deficiency 06/19/2009   Neoplasm by body site 12/23/2008   DIVERTICULOSIS, COLON 11/08/2008   ABDOMINAL PAIN 10/02/2008   Hypothyroidism 12/13/2007   HEMORRHOIDS, NOS 12/13/2007   Belching 12/13/2007   COLONIC POLYPS, HX OF 12/13/2007   Essential hypertension 09/15/2007   Gastroesophageal reflux disease 09/15/2007   PANCREATITIS, CHRONIC 09/15/2007   CARDIAC MURMUR 09/15/2007   SNORING 09/15/2007    PCP: Loralee Pacas, MD  REFERRING PROVIDER: Loralee Pacas, MD  REFERRING DIAG: [phone] (ICD-10-CM) - Back strain, initial encounter [phone] (ICD-10-CM) - Muscle strain of left upper arm, initial encounter  THERAPY DIAG:  Acute pain of left shoulder  Muscle weakness (generalized)  Shoulder blade pain  Rationale for Evaluation and Treatment: Rehabilitation  ONSET DATE: 3-4 weeks  SUBJECTIVE:                                                                                                                                                                                      SUBJECTIVE STATEMENT: Patient's wife is bed bound (weighs 220 pounds) and when she has a bowel movement she has to physically hold her up with her left hand while he helps her with her right. States this has been going on for 4 months. States she thought he was getting better but when he was at the dentist it felt like it irritated the spot on the back of the shoulder blade. States that sometimes it hurts by the shoulder blade, sometimes in the left arm. Pain is worse at night when he lays on his right side. States that ice helps. States sometimes just reaching out with his left arm bothers him.   She is going into a hospice home for 5 days to give him some rest in his shoulder.   PERTINENT HISTORY: heart attack, HTN, Metastatic cancer (being treated and monitored), cardiac catherization  PAIN:  Are you having pain? Yes: NPRS scale: 7/10 Pain location: left shoulder blade Pain description: achy Aggravating factors: using computer,  Relieving factors: ice, rest, repositioning  PRECAUTIONS: None  WEIGHT BEARING RESTRICTIONS: No  FALLS:  Has patient fallen in last 6 months? 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    OBJECTIVE:   DIAGNOSTIC FINDINGS:  Thoracic  spine MRI 05/06/22 IMPRESSION: 1.  Stable small enhancing metastatic lesions involving T6 and T7. The T12 lesions appear improved without definite contrast enhancement on today's study. 2. Stable T5 hemangioma. 3. No new metastatic lesions are identified. 4. No significant thoracic disc protrusions or spinal stenosis.  Spinal  MRI 08/13/22 IMPRESSION: 1. No cervical spine metastatic disease identified.   2. Continued stable MRI appearance of the thoracic spine, with unchanged small treated T6 and T12 metastasis, and stable suspected small T7 body metastasis (T5 benign hemangioma).   3. Two indeterminate lumbar vertebral lesions at L3 (favor a benign hemangioma given CT and MRI appearance) and L4 (indeterminate). Repeat Lumbar MRI can be utilized for surveillance.   4. Generally age-appropriate underlying spinal degeneration. No significant spinal stenosis.      COGNITION: Overall cognitive status: Within functional limits for tasks assessed     SENSATION: Not tested  POSTURE: Forward head, rounded shoulders, increased thoracic kyphosis, and sacral sitting. Right shoulder lower then left.   Cervical  AROM:    10/05/2022      Flexion WFL     Extension  25*     R ROT 50      L ROT 30*      R SB      L SB      * Pain   (Blank rows = not tested)   UE Measurements Upper Extremity Right 10/05/2022 Left 10/05/2022   A/PROM MMT A/PROM MMT  Shoulder Flexion 160* 4 150 4*  Shoulder Extension      Shoulder Abduction WFL (scaption) 4-* WFL (scaption)* 4-*  Shoulder Adduction      Shoulder Internal Rotation Reaches to opposite T12 rib* 4+ Reaches to opposite T12 rib* 4*  Shoulder External Rotation Reaches to top of ipsilateral shoulder blade 4 Reaches to top of ipsilateral shoulder blade 4-*  Elbow Flexion      Elbow Extension      Wrist Flexion      Wrist Extension      Wrist Supination      Wrist Pronation      Wrist Ulnar Deviation      Wrist Radial Deviation      Grip Strength NA  NA     (Blank rows  = not tested)   * pain Right handed     JOINT MOBILITY TESTING:  hypomobility noted in cervical spine  PALPATION:  Tenderness to palpation along left UT, shoulder blade and lat and bilateral suboccipitals   TODAY'S TREATMENT:                                                                                                                                         DATE:  10/05/2022  Therapeutic Exercise:  Aerobic: Supine: cervical rotation - 8 minutes PROM/AAROM Prone:  Seated:  Standing: Neuromuscular Re-education: seated posture on sit bones not tail bones, desk posture with  arm support, posture with walking - not looking down, sleeping posture with neck supported nad left shoulder supported in supine and side lying Manual Therapy: Therapeutic Activity: Self Care: Trigger Point Dry Needling:  Modalities:    PATIENT EDUCATION:  Education details: on current presentation, on HEP, on clinical outcomes score and POC Person educated: Patient Education method: Explanation, Demonstration, and Handouts Education comprehension: verbalized understanding'  HOME EXERCISE PROGRAM: [id number]  ASSESSMENT:  CLINICAL IMPRESSION: Patient presents with left shoulder blade and shoulder pain that are exacerbated and improved with cervical positioning and postures. Patient presents with pain, posture deficits, and ROM restrictions that are limiting overall function at home while taking care of his wife and his sleep quality. Patient would greatly benefit from skilled PT to return patient to optimal QOL.  OBJECTIVE IMPAIRMENTS: decreased activity tolerance, decreased mobility, difficulty walking, decreased ROM, decreased strength, impaired UE functional use, improper body mechanics, postural dysfunction, and pain.   ACTIVITY LIMITATIONS: lifting, sleeping, reach over head, locomotion level, and caring for others  PARTICIPATION LIMITATIONS: interpersonal relationship and occupation  PERSONAL  FACTORS: Age, Fitness, and Past/current experiences are also affecting patient's functional outcome.   REHAB POTENTIAL: Good  CLINICAL DECISION MAKING: Stable/uncomplicated  EVALUATION COMPLEXITY: Low   GOALS: Goals reviewed with patient? yes  SHORT TERM GOALS: Target date: 11/02/2022  Patient will be independent in self management strategies to improve quality of life and functional outcomes. Baseline: New Program Goal status: INITIAL  2.  Patient will report at least 50% improvement in overall symptoms and/or function to demonstrate improved functional mobility Baseline: 0% better Goal status: INITIAL  3.  Patient will be able to demonstrate painfree cervical ROM  Baseline: painful Goal status: INITIAL  4.  Patient will report improved sleeping with pillow/support to reduce stress on neck at night.  Baseline:  Goal status: INITIAL    LONG TERM GOALS: Target date: 11/30/2022   Patient will report at least 75% improvement in overall symptoms and/or function to demonstrate improved functional mobility Baseline: 0% better Goal status: INITIAL  2.  Patient will be able to demonstrate at least 50 degrees of cervical rotation in both directions Baseline:  Goal status: INITIAL  3.  Patient will be able to demonstrate at least 4/5 MMT in B shoulders to improve shoulder strength. Baseline:  Goal status: INITIAL     PLAN:  PT FREQUENCY: 2x/week  PT DURATION: 8 weeks  PLANNED INTERVENTIONS: Therapeutic exercises, Therapeutic activity, Neuromuscular re-education, Balance training, Gait training, Patient/Family education, Self Care, Joint mobilization, Joint manipulation, Vestibular training, Canalith repositioning, Orthotic/Fit training, DME instructions, Aquatic Therapy, Cognitive remediation, Electrical stimulation, Spinal manipulation, Spinal mobilization, Cryotherapy, Moist heat, Traction, Ultrasound, Ionotophoresis '4mg'$ /ml Dexamethasone, Manual therapy, and  Re-evaluation  PLAN FOR NEXT SESSION: neck motions, posture, STM, traction, thoracic mobility, shoulder mobility and strengthening   3:17 PM, 10/05/22 Jerene Pitch, DPT Physical Therapy with [facility]

## 2022-10-11 ENCOUNTER — Encounter: Payer: Self-pay | Admitting: Physical Therapy

## 2022-10-11 ENCOUNTER — Ambulatory Visit (INDEPENDENT_AMBULATORY_CARE_PROVIDER_SITE_OTHER): Payer: Medicare HMO | Admitting: Physical Therapy

## 2022-10-11 DIAGNOSIS — M6281 Muscle weakness (generalized): Secondary | ICD-10-CM

## 2022-10-11 DIAGNOSIS — M25512 Pain in left shoulder: Secondary | ICD-10-CM

## 2022-10-11 DIAGNOSIS — M898X1 Other specified disorders of bone, shoulder: Secondary | ICD-10-CM

## 2022-10-11 NOTE — Therapy (Signed)
OUTPATIENT PHYSICAL THERAPY TREATMENT NOTE   Patient Name: Brian Mays MRN: [id number] DOB:[birth year], 76 y.o., male Today's Date: 10/11/2022  PCP: Loralee Pacas, MD   REFERRING PROVIDER: Loralee Pacas, MD  END OF SESSION:   PT End of Session - 10/11/22 1202     Visit Number 2    Number of Visits 16    Date for PT Re-Evaluation 11/30/22    Authorization Type aetna medicare, VL 30    Authorization - Visit Number 2    Authorization - Number of Visits 30    Progress Note Due on Visit 10    PT Start Time 1215    PT Stop Time 6203    PT Time Calculation (min) 43 min    Activity Tolerance Patient tolerated treatment well             Past Medical History:  Diagnosis Date   Anemia    in past   Cataract    Colon polyps    Diverticulosis    Gallstones    GERD (gastroesophageal reflux disease)    Heart attack ([place])    mild, Spring 2017   Heart murmur    Hemorrhoid    Hiatal hernia    HTN (hypertension)    Hyperlipidemia    Hypothyroidism    Iron deficiency anemia due to chronic blood loss 03/29/2019   Iron malabsorption 03/29/2019   Metastatic carcinoma ([place]) 2010   Dr Jonette Eva   Neuroendocrine cancer [facility]) 08/2021   Pancreatitis 1998   chronic   Past Surgical History:  Procedure Laterality Date   [place] N/A 02/10/2016   Procedure: Left Heart Cath and Coronary Angiography;  Surgeon: Adrian Prows, MD;  Location: [place] [facility];  Service: Cardiovascular;  Laterality: N/A;   CARDIAC CATHETERIZATION  02/10/2016   Procedure: Coronary/Graft Atherectomy;  Surgeon: Adrian Prows, MD;  Location: [place] [facility];  Service: Cardiovascular;;   CARDIAC CATHETERIZATION  02/10/2016   Procedure: Coronary Stent Intervention;  Surgeon: Adrian Prows, MD;  Location: [place] [facility];  Service: Cardiovascular;;   CATARACT EXTRACTION W/ INTRAOCULAR LENS  IMPLANT, BILATERAL Bilateral    CORONARY STENT INTERVENTION N/A 12/16/2021   Procedure:  CORONARY STENT INTERVENTION;  Surgeon: Early Osmond, MD;  Location: [place] [facility];  Service: Cardiovascular;  Laterality: N/A;   IR GENERIC HISTORICAL  12/30/2015   IR RADIOLOGIST EVAL & MGMT 12/30/2015 Aletta Edouard, MD [facility] INTERV RAD   IR GENERIC HISTORICAL  11/09/2016   IR RADIOLOGIST EVAL & MGMT 11/09/2016 Aletta Edouard, MD [facility] INTERV RAD   IR RADIOLOGIST EVAL & MGMT  03/01/2017   IR RADIOLOGIST EVAL & MGMT  06/29/2017   LAPAROSCOPIC CHOLECYSTECTOMY  1999   LEFT HEART CATH AND CORONARY ANGIOGRAPHY N/A 12/16/2021   Procedure: LEFT HEART CATH AND CORONARY ANGIOGRAPHY;  Surgeon: Early Osmond, MD;  Location: [place] [facility];  Service: Cardiovascular;  Laterality: N/A;   LIVER BIOPSY  2010   RADIOACTIVE SEED IMPLANT  X 3   "to my liver"   TUMOR EXCISION  01/2009   Carcinoil Resection    TUMOR REMOVAL     from small intestine   Patient Active Problem List   Diagnosis Date Noted   Secondary malignant neoplasm of bone ([place]) 02/08/2022   Prediabetes 09/03/2021   Near syncope 04/22/2020   Iron deficiency anemia due to chronic blood loss 03/29/2019   Iron malabsorption 03/29/2019   Lumbar radiculopathy 08/16/2017  CAD (coronary artery disease) 09/21/2016   Unstable angina ([place]) 02/09/2016   Vitamin D deficiency 03/11/2015   Metastatic malignant neuroendocrine tumor to liver ([place]) 02/14/2015   Hyperglycemia 04/03/2012   Diarrhea 03/29/2012   B12 deficiency 04/19/2011   Antiplatelet or antithrombotic long-term use 03/30/2011   TOBACCO USE, QUIT 10/13/2009   Unspecified vitamin D deficiency 06/19/2009   Neoplasm by body site 12/23/2008   DIVERTICULOSIS, COLON 11/08/2008   ABDOMINAL PAIN 10/02/2008   Hypothyroidism 12/13/2007   HEMORRHOIDS, NOS 12/13/2007   Belching 12/13/2007   COLONIC POLYPS, HX OF 12/13/2007   Essential hypertension 09/15/2007   Gastroesophageal reflux disease 09/15/2007   PANCREATITIS, CHRONIC 09/15/2007   CARDIAC MURMUR 09/15/2007   SNORING  09/15/2007       REFERRING DIAG: S39.012A (ICD-10-CM) - Back strain, initial encounter S46.912A (ICD-10-CM) - Muscle strain of left upper arm, initial encounter   THERAPY DIAG:  Acute pain of left shoulder   Muscle weakness (generalized)   Shoulder blade pain   Rationale for Evaluation and Treatment: Rehabilitation   ONSET DATE: 3-4 weeks   SUBJECTIVE:                                                                                                                                                                                       SUBJECTIVE STATEMENT: 10/11/2022 States that his back is better and is not as bad as it was. Been doing his exercises and may get a massage   Eval: Patient's wife is bed bound (weighs 220 pounds) and when she has a bowel movement she has to physically hold her up with her left hand while he helps her with her right. States this has been going on for 4 months. States she thought he was getting better but when he was at the dentist it felt like it irritated the spot on the back of the shoulder blade. States that sometimes it hurts by the shoulder blade, sometimes in the left arm. Pain is worse at night when he lays on his right side. States that ice helps. States sometimes just reaching out with his left arm bothers him.    She is going into a hospice home for 5 days to give him some rest in his shoulder.    PERTINENT HISTORY: heart attack, HTN, Metastatic cancer (being treated and monitored), cardiac catherization   PAIN:  Are you having pain? Yes: NPRS scale: 3/10 Pain location: neck left side  Pain description: slight discomfort  Aggravating factors: using computer,  Relieving factors: ice, rest, repositioning   PRECAUTIONS: None   WEIGHT BEARING RESTRICTIONS: No   FALLS:  Has patient fallen in last 6 months?  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      OBJECTIVE:    DIAGNOSTIC FINDINGS:  Thoracic spine MRI 05/06/22 IMPRESSION: 1. Stable small enhancing metastatic lesions involving T6 and T7. The T12 lesions appear improved without definite contrast enhancement on today's study. 2. Stable T5 hemangioma. 3. No new metastatic lesions are identified. 4. No significant thoracic disc protrusions or spinal stenosis.   Spinal  MRI 08/13/22 IMPRESSION: 1. No cervical spine metastatic disease identified.   2. Continued stable MRI appearance of the thoracic spine, with unchanged small treated T6 and T12 metastasis, and stable suspected small T7 body metastasis (T5 benign hemangioma).   3. Two indeterminate lumbar vertebral lesions at L3 (favor a benign hemangioma given CT and MRI appearance) and L4 (indeterminate). Repeat Lumbar MRI can be utilized for surveillance.   4. Generally age-appropriate underlying spinal degeneration. No significant spinal stenosis.         COGNITION: Overall cognitive status: Within functional limits for tasks assessed                                  SENSATION: Not tested   POSTURE: Forward head, rounded shoulders, increased thoracic kyphosis, and sacral sitting. Right shoulder lower then left.             Cervical  AROM:    10/05/2022      Flexion WFL     Extension  25*     R ROT 50      L ROT 30*      R SB      L SB                            * Pain              (Blank rows = not tested)              UE Measurements       Upper Extremity Right 10/05/2022 Left 10/05/2022    A/PROM MMT A/PROM MMT  Shoulder Flexion 160* 4 150 4*  Shoulder Extension          Shoulder Abduction WFL (scaption) 4-* WFL (scaption)* 4-*  Shoulder Adduction          Shoulder Internal Rotation Reaches to opposite T12 rib* 4+ Reaches to opposite T12 rib* 4*  Shoulder External Rotation Reaches to top of ipsilateral shoulder blade 4  Reaches to top of ipsilateral shoulder blade 4-*  Elbow Flexion          Elbow Extension          Wrist Flexion          Wrist Extension          Wrist Supination          Wrist Pronation          Wrist Ulnar Deviation          Wrist Radial Deviation          Grip Strength NA   NA                          (Blank rows =  not tested)                       * pain Right handed        JOINT MOBILITY TESTING:  hypomobility noted in cervical spine   PALPATION:  Tenderness to palpation along left UT, shoulder blade and lat and bilateral suboccipitals             TODAY'S TREATMENT:                                                                                                                                         DATE:  10/11/2022 Therapeutic Exercise:    Aerobic: Supine: cervical rotation - 8 minutes PROM/AAROM, serratus punches 5x5 5-10" holds B, 10 cervical rotation B 5" holds  Prone:    Seated: self percussion gun to shoulders with padding 5 minutes    s/l: book stretch 5 minutes Neuromuscular Re-education: bobble heads 10 minutes PT assist Manual Therapy: STM to suboccipitals and cervical paraspinals, cervical traction 5 minutes Therapeutic Activity: Self Care: Trigger Point Dry Needling:  Modalities:      PATIENT EDUCATION:  Education details: on HEP  Person educated: Patient Education method: Explanation, Demonstration, and Handouts Education comprehension: verbalized understanding'   HOME EXERCISE PROGRAM: [id number]   ASSESSMENT:   CLINICAL IMPRESSION: 10/11/2022 Focused on manual, education and exercise progression. Tolerated this well. Reduced pain noted when patient relaxed with exercises as he has a tendency to tense up and hold his breath. Added new exercises to HEP. Discussed benefits of percussion gun as patient interested ins oft tissue work. No pain noted end of session just mild awareness of shoulders. Will continue with current POC as tolerated..    Eval: Patient presents with left shoulder blade and shoulder pain that are exacerbated and improved with cervical positioning and postures. Patient presents with pain, posture deficits, and ROM restrictions that are limiting overall function at home while taking care of his wife and his sleep quality. Patient would greatly benefit from skilled PT to return patient to optimal QOL.   OBJECTIVE IMPAIRMENTS: decreased activity tolerance, decreased mobility, difficulty walking, decreased ROM, decreased strength, impaired UE functional use, improper body mechanics, postural dysfunction, and pain.    ACTIVITY LIMITATIONS: lifting, sleeping, reach over head, locomotion level, and caring for others   PARTICIPATION LIMITATIONS: interpersonal relationship and occupation   PERSONAL FACTORS: Age, Fitness, and Past/current experiences are also affecting patient's functional outcome.    REHAB POTENTIAL: Good   CLINICAL DECISION MAKING: Stable/uncomplicated   EVALUATION COMPLEXITY: Low     GOALS: Goals reviewed with patient? yes   SHORT TERM GOALS: Target date: 11/02/2022  Patient will be independent in self management strategies to improve quality of life and functional outcomes. Baseline: New Program Goal status: INITIAL   2.  Patient will report at least 50% improvement in overall symptoms and/or function to demonstrate  improved functional mobility Baseline: 0% better Goal status: INITIAL   3.  Patient will be able to demonstrate painfree cervical ROM  Baseline: painful Goal status: INITIAL   4.  Patient will report improved sleeping with pillow/support to reduce stress on neck at night.  Baseline:  Goal status: INITIAL       LONG TERM GOALS: Target date: 11/30/2022    Patient will report at least 75% improvement in overall symptoms and/or function to demonstrate improved functional mobility Baseline: 0% better Goal status: INITIAL   2.  Patient will be able to demonstrate at least  50 degrees of cervical rotation in both directions Baseline:  Goal status: INITIAL   3.  Patient will be able to demonstrate at least 4/5 MMT in B shoulders to improve shoulder strength. Baseline:  Goal status: INITIAL         PLAN:   PT FREQUENCY: 2x/week   PT DURATION: 8 weeks   PLANNED INTERVENTIONS: Therapeutic exercises, Therapeutic activity, Neuromuscular re-education, Balance training, Gait training, Patient/Family education, Self Care, Joint mobilization, Joint manipulation, Vestibular training, Canalith repositioning, Orthotic/Fit training, DME instructions, Aquatic Therapy, Cognitive remediation, Electrical stimulation, Spinal manipulation, Spinal mobilization, Cryotherapy, Moist heat, Traction, Ultrasound, Ionotophoresis '4mg'$ /ml Dexamethasone, Manual therapy, and Re-evaluation   PLAN FOR NEXT SESSION: neck motions, posture, STM, traction, thoracic mobility, shoulder mobility and strengthening     1:02 PM, 10/11/22 Jerene Pitch, DPT Physical Therapy with [facility]

## 2022-10-13 ENCOUNTER — Ambulatory Visit (INDEPENDENT_AMBULATORY_CARE_PROVIDER_SITE_OTHER): Payer: Medicare HMO | Admitting: Physical Therapy

## 2022-10-13 ENCOUNTER — Encounter: Payer: Self-pay | Admitting: Physical Therapy

## 2022-10-13 DIAGNOSIS — M25512 Pain in left shoulder: Secondary | ICD-10-CM

## 2022-10-13 DIAGNOSIS — M898X1 Other specified disorders of bone, shoulder: Secondary | ICD-10-CM

## 2022-10-13 DIAGNOSIS — M6281 Muscle weakness (generalized): Secondary | ICD-10-CM | POA: Diagnosis not present

## 2022-10-13 NOTE — Therapy (Signed)
OUTPATIENT PHYSICAL THERAPY TREATMENT NOTE   Patient Name: Brian Mays MRN: [id number] DOB:1946-01-18, 76 y.o., male Today's Date: 10/13/2022  PCP: Loralee Pacas, MD   REFERRING PROVIDER: Loralee Pacas, MD  END OF SESSION:   PT End of Session - 10/13/22 1215     Visit Number 3    Number of Visits 16    Date for PT Re-Evaluation 11/30/22    Authorization Type aetna medicare, VL 30    Authorization - Visit Number 3    Authorization - Number of Visits 30    Progress Note Due on Visit 10    PT Start Time 1217    PT Stop Time 3299    PT Time Calculation (min) 38 min    Activity Tolerance Patient tolerated treatment well             Past Medical History:  Diagnosis Date   Anemia    in past   Cataract    Colon polyps    Diverticulosis    Gallstones    GERD (gastroesophageal reflux disease)    Heart attack ([place])    mild, Spring 2017   Heart murmur    Hemorrhoid    Hiatal hernia    HTN (hypertension)    Hyperlipidemia    Hypothyroidism    Iron deficiency anemia due to chronic blood loss 03/29/2019   Iron malabsorption 03/29/2019   Metastatic carcinoma ([place]) 2010   Dr Jonette Eva   Neuroendocrine cancer [facility]) 08/2021   Pancreatitis 1998   chronic   Past Surgical History:  Procedure Laterality Date   [place] N/A 02/10/2016   Procedure: Left Heart Cath and Coronary Angiography;  Surgeon: Adrian Prows, MD;  Location: [place] [facility];  Service: Cardiovascular;  Laterality: N/A;   CARDIAC CATHETERIZATION  02/10/2016   Procedure: Coronary/Graft Atherectomy;  Surgeon: Adrian Prows, MD;  Location: [place] [facility];  Service: Cardiovascular;;   CARDIAC CATHETERIZATION  02/10/2016   Procedure: Coronary Stent Intervention;  Surgeon: Adrian Prows, MD;  Location: [place] [facility];  Service: Cardiovascular;;   CATARACT EXTRACTION W/ INTRAOCULAR LENS  IMPLANT, BILATERAL Bilateral    CORONARY STENT INTERVENTION N/A 12/16/2021   Procedure:  CORONARY STENT INTERVENTION;  Surgeon: Early Osmond, MD;  Location: [place] [facility];  Service: Cardiovascular;  Laterality: N/A;   IR GENERIC HISTORICAL  12/30/2015   IR RADIOLOGIST EVAL & MGMT 12/30/2015 Aletta Edouard, MD [facility] INTERV RAD   IR GENERIC HISTORICAL  11/09/2016   IR RADIOLOGIST EVAL & MGMT 11/09/2016 Aletta Edouard, MD [facility] INTERV RAD   IR RADIOLOGIST EVAL & MGMT  03/01/2017   IR RADIOLOGIST EVAL & MGMT  06/29/2017   LAPAROSCOPIC CHOLECYSTECTOMY  1999   LEFT HEART CATH AND CORONARY ANGIOGRAPHY N/A 12/16/2021   Procedure: LEFT HEART CATH AND CORONARY ANGIOGRAPHY;  Surgeon: Early Osmond, MD;  Location: [place] [facility];  Service: Cardiovascular;  Laterality: N/A;   LIVER BIOPSY  2010   RADIOACTIVE SEED IMPLANT  X 3   "to my liver"   TUMOR EXCISION  01/2009   Carcinoil Resection    TUMOR REMOVAL     from small intestine   Patient Active Problem List   Diagnosis Date Noted   Secondary malignant neoplasm of bone ([place]) 02/08/2022   Prediabetes 09/03/2021   Near syncope 04/22/2020   Iron deficiency anemia due to chronic blood loss 03/29/2019   Iron malabsorption 03/29/2019   Lumbar radiculopathy 08/16/2017  CAD (coronary artery disease) 09/21/2016   Unstable angina ([place]) 02/09/2016   Vitamin D deficiency 03/11/2015   Metastatic malignant neuroendocrine tumor to liver ([place]) 02/14/2015   Hyperglycemia 04/03/2012   Diarrhea 03/29/2012   B12 deficiency 04/19/2011   Antiplatelet or antithrombotic long-term use 03/30/2011   TOBACCO USE, QUIT 10/13/2009   Unspecified vitamin D deficiency 06/19/2009   Neoplasm by body site 12/23/2008   DIVERTICULOSIS, COLON 11/08/2008   ABDOMINAL PAIN 10/02/2008   Hypothyroidism 12/13/2007   HEMORRHOIDS, NOS 12/13/2007   Belching 12/13/2007   COLONIC POLYPS, HX OF 12/13/2007   Essential hypertension 09/15/2007   Gastroesophageal reflux disease 09/15/2007   PANCREATITIS, CHRONIC 09/15/2007   CARDIAC MURMUR 09/15/2007   SNORING  09/15/2007       REFERRING DIAG: S39.012A (ICD-10-CM) - Back strain, initial encounter S46.912A (ICD-10-CM) - Muscle strain of left upper arm, initial encounter   THERAPY DIAG:  Acute pain of left shoulder   Muscle weakness (generalized)   Shoulder blade pain   Rationale for Evaluation and Treatment: Rehabilitation   ONSET DATE: 3-4 weeks   SUBJECTIVE:                                                                                                                                                                                       SUBJECTIVE STATEMENT: 10/13/2022 States that he feels occasionally twinges but overall feeling better.  Eval: Patient's wife is bed bound (weighs 220 pounds) and when she has a bowel movement she has to physically hold her up with her left hand while he helps her with her right. States this has been going on for 4 months. States she thought he was getting better but when he was at the dentist it felt like it irritated the spot on the back of the shoulder blade. States that sometimes it hurts by the shoulder blade, sometimes in the left arm. Pain is worse at night when he lays on his right side. States that ice helps. States sometimes just reaching out with his left arm bothers him.    She is going into a hospice home for 5 days to give him some rest in his shoulder.    PERTINENT HISTORY: heart attack, HTN, Metastatic cancer (being treated and monitored), cardiac catherization   PAIN:  Are you having pain? Yes: NPRS scale: 2/10 Pain location: neck left side and into left arm Pain description: slight discomfort  Aggravating factors: using computer,  Relieving factors: ice, rest, repositioning   PRECAUTIONS: None   WEIGHT BEARING RESTRICTIONS: No   FALLS:  Has patient fallen in last 6 months? 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      OBJECTIVE:    DIAGNOSTIC FINDINGS:  Thoracic spine MRI 05/06/22 IMPRESSION: 1. Stable small enhancing metastatic lesions involving T6 and T7. The T12 lesions appear improved without definite contrast enhancement on today's study. 2. Stable T5 hemangioma. 3. No new metastatic lesions are identified. 4. No significant thoracic disc protrusions or spinal stenosis.   Spinal  MRI 08/13/22 IMPRESSION: 1. No cervical spine metastatic disease identified.   2. Continued stable MRI appearance of the thoracic spine, with unchanged small treated T6 and T12 metastasis, and stable suspected small T7 body metastasis (T5 benign hemangioma).   3. Two indeterminate lumbar vertebral lesions at L3 (favor a benign hemangioma given CT and MRI appearance) and L4 (indeterminate). Repeat Lumbar MRI can be utilized for surveillance.   4. Generally age-appropriate underlying spinal degeneration. No significant spinal stenosis.         COGNITION: Overall cognitive status: Within functional limits for tasks assessed                                  SENSATION: Not tested   POSTURE: Forward head, rounded shoulders, increased thoracic kyphosis, and sacral sitting. Right shoulder lower then left.             Cervical  AROM:    10/05/2022      Flexion WFL     Extension  25*     R ROT 50      L ROT 30*      R SB      L SB                            * Pain              (Blank rows = not tested)              UE Measurements       Upper Extremity Right 10/05/2022 Left 10/05/2022    A/PROM MMT A/PROM MMT  Shoulder Flexion 160* 4 150 4*  Shoulder Extension          Shoulder Abduction WFL (scaption) 4-* WFL (scaption)* 4-*  Shoulder Adduction          Shoulder Internal Rotation Reaches to opposite T12 rib* 4+ Reaches to opposite T12 rib* 4*  Shoulder External Rotation Reaches to top of ipsilateral shoulder blade 4 Reaches to top of  ipsilateral shoulder blade 4-*  Elbow Flexion          Elbow Extension          Wrist Flexion          Wrist Extension          Wrist Supination          Wrist Pronation          Wrist Ulnar Deviation          Wrist Radial Deviation          Grip Strength NA   NA                          (Blank rows = not tested)                       *  pain Right handed        JOINT MOBILITY TESTING:  hypomobility noted in cervical spine   PALPATION:  Tenderness to palpation along left UT, shoulder blade and lat and bilateral suboccipitals             TODAY'S TREATMENT:                                                                                                                                         DATE:  10/13/2022 Therapeutic Exercise:    Aerobic: Supine: towel roll down back 3 minutes, cervical rotation - 8 minutes PROM/AAROM, serratus punches 3x10 5-10" holds B,  Prone:    Seated:     s/l: book stretch- reviewed Neuromuscular Re-education: bobble heads 5 minutes PT assist Manual Therapy: STM to suboccipitals and cervical paraspinals, cervical traction 15 minutes with and without cervical ROT Therapeutic Activity: Self Care: Trigger Point Dry Needling:  Modalities:      PATIENT EDUCATION:  Education details: on HEP  Person educated: Patient Education method: Explanation, Demonstration, and Handouts Education comprehension: verbalized understanding'   HOME EXERCISE PROGRAM: [email or web address]   ASSESSMENT:   CLINICAL IMPRESSION: 10/13/2022 Focused on manual work which was tolerated well. Improved cervical ROT noted after cervical traction and retraction. Reduced pain noted end of session. Reviewed chin tucks/bobble heads for HEP. Will continue with current POC As tolerated.   Eval: Patient presents with left shoulder blade and shoulder pain that are exacerbated and improved with cervical positioning and postures. Patient presents with pain, posture deficits, and ROM restrictions  that are limiting overall function at home while taking care of his wife and his sleep quality. Patient would greatly benefit from skilled PT to return patient to optimal QOL.   OBJECTIVE IMPAIRMENTS: decreased activity tolerance, decreased mobility, difficulty walking, decreased ROM, decreased strength, impaired UE functional use, improper body mechanics, postural dysfunction, and pain.    ACTIVITY LIMITATIONS: lifting, sleeping, reach over head, locomotion level, and caring for others   PARTICIPATION LIMITATIONS: interpersonal relationship and occupation   PERSONAL FACTORS: Age, Fitness, and Past/current experiences are also affecting patient's functional outcome.    REHAB POTENTIAL: Good   CLINICAL DECISION MAKING: Stable/uncomplicated   EVALUATION COMPLEXITY: Low     GOALS: Goals reviewed with patient? yes   SHORT TERM GOALS: Target date: 11/02/2022  Patient will be independent in self management strategies to improve quality of life and functional outcomes. Baseline: New Program Goal status: INITIAL   2.  Patient will report at least 50% improvement in overall symptoms and/or function to demonstrate improved functional mobility Baseline: 0% better Goal status: INITIAL   3.  Patient will be able to demonstrate painfree cervical ROM  Baseline: painful Goal status: INITIAL   4.  Patient will report improved sleeping with pillow/support to reduce stress on neck at night.  Baseline:  Goal status: INITIAL  LONG TERM GOALS: Target date: 11/30/2022    Patient will report at least 75% improvement in overall symptoms and/or function to demonstrate improved functional mobility Baseline: 0% better Goal status: INITIAL   2.  Patient will be able to demonstrate at least 50 degrees of cervical rotation in both directions Baseline:  Goal status: INITIAL   3.  Patient will be able to demonstrate at least 4/5 MMT in B shoulders to improve shoulder strength. Baseline:  Goal  status: INITIAL         PLAN:   PT FREQUENCY: 2x/week   PT DURATION: 8 weeks   PLANNED INTERVENTIONS: Therapeutic exercises, Therapeutic activity, Neuromuscular re-education, Balance training, Gait training, Patient/Family education, Self Care, Joint mobilization, Joint manipulation, Vestibular training, Canalith repositioning, Orthotic/Fit training, DME instructions, Aquatic Therapy, Cognitive remediation, Electrical stimulation, Spinal manipulation, Spinal mobilization, Cryotherapy, Moist heat, Traction, Ultrasound, Ionotophoresis '4mg'$ /ml Dexamethasone, Manual therapy, and Re-evaluation   PLAN FOR NEXT SESSION: neck motions, posture, STM, traction, thoracic mobility, shoulder mobility and strengthening     12:57 PM, 10/13/22 Jerene Pitch, DPT Physical Therapy with [facility]

## 2022-10-18 ENCOUNTER — Encounter: Payer: Self-pay | Admitting: Family

## 2022-10-18 ENCOUNTER — Inpatient Hospital Stay: Payer: Medicare HMO | Attending: Hematology & Oncology

## 2022-10-18 ENCOUNTER — Ambulatory Visit (INDEPENDENT_AMBULATORY_CARE_PROVIDER_SITE_OTHER): Payer: Medicare HMO | Admitting: Physical Therapy

## 2022-10-18 ENCOUNTER — Inpatient Hospital Stay: Payer: Medicare HMO

## 2022-10-18 ENCOUNTER — Encounter: Payer: Self-pay | Admitting: Physical Therapy

## 2022-10-18 ENCOUNTER — Other Ambulatory Visit: Payer: Self-pay | Admitting: Cardiovascular Disease

## 2022-10-18 ENCOUNTER — Inpatient Hospital Stay (HOSPITAL_BASED_OUTPATIENT_CLINIC_OR_DEPARTMENT_OTHER): Payer: Medicare HMO | Admitting: Family

## 2022-10-18 VITALS — BP 133/61 | HR 67 | Temp 97.9°F | Resp 17 | Wt 179.8 lb

## 2022-10-18 DIAGNOSIS — C7B8 Other secondary neuroendocrine tumors: Secondary | ICD-10-CM

## 2022-10-18 DIAGNOSIS — M898X1 Other specified disorders of bone, shoulder: Secondary | ICD-10-CM | POA: Diagnosis not present

## 2022-10-18 DIAGNOSIS — C7B02 Secondary carcinoid tumors of liver: Secondary | ICD-10-CM | POA: Diagnosis not present

## 2022-10-18 DIAGNOSIS — C7A00 Malignant carcinoid tumor of unspecified site: Secondary | ICD-10-CM | POA: Diagnosis not present

## 2022-10-18 DIAGNOSIS — D500 Iron deficiency anemia secondary to blood loss (chronic): Secondary | ICD-10-CM

## 2022-10-18 DIAGNOSIS — M25512 Pain in left shoulder: Secondary | ICD-10-CM | POA: Diagnosis not present

## 2022-10-18 DIAGNOSIS — M6281 Muscle weakness (generalized): Secondary | ICD-10-CM | POA: Diagnosis not present

## 2022-10-18 LAB — CBC WITH DIFFERENTIAL (CANCER CENTER ONLY)
Abs Immature Granulocytes: 0.02 10*3/uL (ref 0.00–0.07)
Basophils Absolute: 0 10*3/uL (ref 0.0–0.1)
Basophils Relative: 0 %
Eosinophils Absolute: 0.2 10*3/uL (ref 0.0–0.5)
Eosinophils Relative: 3 %
HCT: 35.8 % — ABNORMAL LOW (ref 39.0–52.0)
Hemoglobin: 11.6 g/dL — ABNORMAL LOW (ref 13.0–17.0)
Immature Granulocytes: 0 %
Lymphocytes Relative: 13 %
Lymphs Abs: 1 10*3/uL (ref 0.7–4.0)
MCH: 30.5 pg (ref 26.0–34.0)
MCHC: 32.4 g/dL (ref 30.0–36.0)
MCV: 94.2 fL (ref 80.0–100.0)
Monocytes Absolute: 0.4 10*3/uL (ref 0.1–1.0)
Monocytes Relative: 6 %
Neutro Abs: 5.8 10*3/uL (ref 1.7–7.7)
Neutrophils Relative %: 78 %
Platelet Count: 125 10*3/uL — ABNORMAL LOW (ref 150–400)
RBC: 3.8 MIL/uL — ABNORMAL LOW (ref 4.22–5.81)
RDW: 15.3 % (ref 11.5–15.5)
WBC Count: 7.4 10*3/uL (ref 4.0–10.5)
nRBC: 0 % (ref 0.0–0.2)

## 2022-10-18 LAB — CMP (CANCER CENTER ONLY)
ALT: 14 U/L (ref 0–44)
AST: 19 U/L (ref 15–41)
Albumin: 3.9 g/dL (ref 3.5–5.0)
Alkaline Phosphatase: 43 U/L (ref 38–126)
Anion gap: 6 (ref 5–15)
BUN: 20 mg/dL (ref 8–23)
CO2: 31 mmol/L (ref 22–32)
Calcium: 9 mg/dL (ref 8.9–10.3)
Chloride: 104 mmol/L (ref 98–111)
Creatinine: 1.12 mg/dL (ref 0.61–1.24)
GFR, Estimated: >60 mL/min (ref >60)
Glucose, Bld: 134 mg/dL — ABNORMAL HIGH (ref 70–99)
Potassium: 4.6 mmol/L (ref 3.5–5.1)
Sodium: 141 mmol/L (ref 135–145)
Total Bilirubin: 0.9 mg/dL (ref 0.3–1.2)
Total Protein: 6.6 g/dL (ref 6.5–8.1)

## 2022-10-18 LAB — LACTATE DEHYDROGENASE: LDH: 159 U/L (ref 98–192)

## 2022-10-18 MED ORDER — LANREOTIDE ACETATE 120 MG/0.5ML ~~LOC~~ SOLN
120.0000 mg | Freq: Once | SUBCUTANEOUS | Status: AC
  Administered 2022-10-18: 120 mg via SUBCUTANEOUS
  Filled 2022-10-18: qty 120

## 2022-10-18 NOTE — Patient Instructions (Signed)
Lanreotide Injection What is this medication? LANREOTIDE (lan REE oh tide) treats high levels of growth hormone (acromegaly). It is used when other therapies have not worked well enough or cannot be tolerated. It works by reducing the amount of growth hormone your body makes. This reduces symptoms and the risk of health problems caused by too much growth hormone, such as diabetes and heart disease. It may also be used to treat neuroendocrine tumors, a cancer of the cells that release hormones and other substances in your body. It works by slowing down the release of these substances from the cells. This slows tumor growth. It also decreases the symptoms of carcinoid syndrome, such as flushing or diarrhea. This medicine may be used for other purposes; ask your health care provider or pharmacist if you have questions. COMMON BRAND NAME(S): Somatuline [place] What should I tell my care team before I take this medication? They need to know if you have any of these conditions: Diabetes Gallbladder disease Heart disease Kidney disease Liver disease Thyroid disease An unusual or allergic reaction to lanreotide, other medications, foods, dyes, or preservatives Pregnant or trying to get pregnant Breast-feeding How should I use this medication? This medication is injected under the skin. It is given by your care team in a hospital or clinic setting. Talk to your care team about the use of this medication in children. Special care may be needed. Overdosage: If you think you have taken too much of this medicine contact a poison control center or emergency room at once. NOTE: This medicine is only for you. Do not share this medicine with others. What if I miss a dose? Keep appointments for follow-up doses. It is important not to miss your dose. Call your care team if you are unable to keep an appointment. What may interact with this medication? Bromocriptine Cyclosporine Certain medications for blood  pressure, heart disease, irregular heartbeat Certain medications for diabetes Quinidine Terfenadine This list may not describe all possible interactions. Give your health care provider a list of all the medicines, herbs, non-prescription drugs, or dietary supplements you use. Also tell them if you smoke, drink alcohol, or use illegal drugs. Some items may interact with your medicine. What should I watch for while using this medication? Visit your care team for regular checks on your progress. Tell your care team if your symptoms do not start to get better or if they get worse. Your condition will be monitored carefully while you are receiving this medication. You may need blood work while you are taking this medication. This medication may increase blood sugar. The risk may be higher in patients who already have diabetes. Ask your care team what you can do to lower your risk of diabetes while taking this medication. Talk to your care team if you wish to become pregnant or think you may be pregnant. This medication can cause serious birth defects. Do not breast-feed while taking this medication and for 6 months after stopping therapy. This medication may cause infertility. Talk to your care team if you are concerned about your fertility. What side effects may I notice from receiving this medication? Side effects that you should report to your care team as soon as possible: Allergic reactions--skin rash, itching, hives, swelling of the face, lips, tongue, or throat Gallbladder problems--severe stomach pain, nausea, vomiting, fever High blood sugar (hyperglycemia)--increased thirst or amount of urine, unusual weakness or fatigue, blurry vision Increase in blood pressure Low blood sugar (hypoglycemia)--tremors or shaking, anxiety, sweating, cold   or clammy skin, confusion, dizziness, rapid heartbeat Low thyroid levels (hypothyroidism)--unusual weakness or fatigue, increased sensitivity to cold,  constipation, hair loss, dry skin, weight gain, feelings of depression Slow heartbeat--dizziness, feeling faint or lightheaded, confusion, trouble breathing, unusual weakness or fatigue Side effects that usually do not require medical attention (report to your care team if they continue or are bothersome): Diarrhea Dizziness Headache Muscle spasms Nausea Pain, redness, irritation, or bruising at the injection site Stomach pain This list may not describe all possible side effects. Call your doctor for medical advice about side effects. You may report side effects to FDA at 1-800-FDA-1088. Where should I keep my medication? This medication is given in a hospital or clinic. It will not be stored at home. NOTE: This sheet is a summary. It may not cover all possible information. If you have questions about this medicine, talk to your doctor, pharmacist, or health care provider.  2023 Elsevier/Gold Standard (2021-12-18 00:00:00)  

## 2022-10-18 NOTE — Therapy (Signed)
OUTPATIENT PHYSICAL THERAPY TREATMENT NOTE   Patient Name: Brian Mays MRN: [id number] DOB:22-Aug-1946, 76 y.o., male Today's Date: 10/18/2022  PCP: Loralee Pacas, MD   REFERRING PROVIDER: Loralee Pacas, MD  END OF SESSION:   PT End of Session - 10/18/22 1104     Visit Number 4    Number of Visits 16    Date for PT Re-Evaluation 11/30/22    Authorization Type aetna medicare, VL 30    Authorization - Visit Number 4    Authorization - Number of Visits 30    Progress Note Due on Visit 10    PT Start Time 1105    PT Stop Time 0932    PT Time Calculation (min) 39 min    Activity Tolerance Patient tolerated treatment well             Past Medical History:  Diagnosis Date   Anemia    in past   Cataract    Colon polyps    Diverticulosis    Gallstones    GERD (gastroesophageal reflux disease)    Heart attack ([place])    mild, Spring 2017   Heart murmur    Hemorrhoid    Hiatal hernia    HTN (hypertension)    Hyperlipidemia    Hypothyroidism    Iron deficiency anemia due to chronic blood loss 03/29/2019   Iron malabsorption 03/29/2019   Metastatic carcinoma ([place]) 2010   Dr Jonette Eva   Neuroendocrine cancer [facility]) 08/2021   Pancreatitis 1998   chronic   Past Surgical History:  Procedure Laterality Date   APPENDECTOMY  1962   CARDIAC CATHETERIZATION N/A 02/10/2016   Procedure: Left Heart Cath and Coronary Angiography;  Surgeon: Adrian Prows, MD;  Location: [place] [facility];  Service: Cardiovascular;  Laterality: N/A;   CARDIAC CATHETERIZATION  02/10/2016   Procedure: Coronary/Graft Atherectomy;  Surgeon: Adrian Prows, MD;  Location: [place] [facility];  Service: Cardiovascular;;   CARDIAC CATHETERIZATION  02/10/2016   Procedure: Coronary Stent Intervention;  Surgeon: Adrian Prows, MD;  Location: [place] [facility];  Service: Cardiovascular;;   CATARACT EXTRACTION W/ INTRAOCULAR LENS  IMPLANT, BILATERAL Bilateral    CORONARY STENT INTERVENTION N/A 12/16/2021   Procedure:  CORONARY STENT INTERVENTION;  Surgeon: Early Osmond, MD;  Location: [place] [facility];  Service: Cardiovascular;  Laterality: N/A;   IR GENERIC HISTORICAL  12/30/2015   IR RADIOLOGIST EVAL & MGMT 12/30/2015 Aletta Edouard, MD [facility] INTERV RAD   IR GENERIC HISTORICAL  11/09/2016   IR RADIOLOGIST EVAL & MGMT 11/09/2016 Aletta Edouard, MD [facility] INTERV RAD   IR RADIOLOGIST EVAL & MGMT  03/01/2017   IR RADIOLOGIST EVAL & MGMT  06/29/2017   LAPAROSCOPIC CHOLECYSTECTOMY  1999   LEFT HEART CATH AND CORONARY ANGIOGRAPHY N/A 12/16/2021   Procedure: LEFT HEART CATH AND CORONARY ANGIOGRAPHY;  Surgeon: Early Osmond, MD;  Location: [place] [facility];  Service: Cardiovascular;  Laterality: N/A;   LIVER BIOPSY  2010   RADIOACTIVE SEED IMPLANT  X 3   "to my liver"   TUMOR EXCISION  01/2009   Carcinoil Resection    TUMOR REMOVAL     from small intestine   Patient Active Problem List   Diagnosis Date Noted   Secondary malignant neoplasm of bone ([place]) 02/08/2022   Prediabetes 09/03/2021   Near syncope 04/22/2020   Iron deficiency anemia due to chronic blood loss 03/29/2019   Iron malabsorption 03/29/2019   Lumbar radiculopathy 08/16/2017  CAD (coronary artery disease) 09/21/2016   Unstable angina ([place]) 02/09/2016   Vitamin D deficiency 03/11/2015   Metastatic malignant neuroendocrine tumor to liver ([place]) 02/14/2015   Hyperglycemia 04/03/2012   Diarrhea 03/29/2012   B12 deficiency 04/19/2011   Antiplatelet or antithrombotic long-term use 03/30/2011   TOBACCO USE, QUIT 10/13/2009   Unspecified vitamin D deficiency 06/19/2009   Neoplasm by body site 12/23/2008   DIVERTICULOSIS, COLON 11/08/2008   ABDOMINAL PAIN 10/02/2008   Hypothyroidism 12/13/2007   HEMORRHOIDS, NOS 12/13/2007   Belching 12/13/2007   COLONIC POLYPS, HX OF 12/13/2007   Essential hypertension 09/15/2007   Gastroesophageal reflux disease 09/15/2007   PANCREATITIS, CHRONIC 09/15/2007   CARDIAC MURMUR 09/15/2007   SNORING  09/15/2007       REFERRING DIAG: S39.012A (ICD-10-CM) - Back strain, initial encounter S46.912A (ICD-10-CM) - Muscle strain of left upper arm, initial encounter   THERAPY DIAG:  Acute pain of left shoulder   Muscle weakness (generalized)   Shoulder blade pain   Rationale for Evaluation and Treatment: Rehabilitation   ONSET DATE: 3-4 weeks   SUBJECTIVE:                                                                                                                                                                                       SUBJECTIVE STATEMENT: 10/18/2022 States that he bought a Production assistant, radio and that helps. States he feels better but still feels tight.  Eval: Patient's wife is bed bound (weighs 220 pounds) and when she has a bowel movement she has to physically hold her up with her left hand while he helps her with her right. States this has been going on for 4 months. States she thought he was getting better but when he was at the dentist it felt like it irritated the spot on the back of the shoulder blade. States that sometimes it hurts by the shoulder blade, sometimes in the left arm. Pain is worse at night when he lays on his right side. States that ice helps. States sometimes just reaching out with his left arm bothers him.    She is going into a hospice home for 5 days to give him some rest in his shoulder.    PERTINENT HISTORY: heart attack, HTN, Metastatic cancer (being treated and monitored), cardiac catherization   PAIN:  Are you having pain? Yes: NPRS scale: .5/10 Pain location: neck left side and into left arm Pain description: slight discomfort  Aggravating factors: using computer,  Relieving factors: ice, rest, repositioning   PRECAUTIONS: None   WEIGHT BEARING RESTRICTIONS: No   FALLS:  Has patient fallen in last 6 months? 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      OBJECTIVE:    DIAGNOSTIC FINDINGS:  Thoracic spine MRI 05/06/22 IMPRESSION: 1. Stable small enhancing metastatic lesions involving T6 and T7. The T12 lesions appear improved without definite contrast enhancement on today's study. 2. Stable T5 hemangioma. 3. No new metastatic lesions are identified. 4. No significant thoracic disc protrusions or spinal stenosis.   Spinal  MRI 08/13/22 IMPRESSION: 1. No cervical spine metastatic disease identified.   2. Continued stable MRI appearance of the thoracic spine, with unchanged small treated T6 and T12 metastasis, and stable suspected small T7 body metastasis (T5 benign hemangioma).   3. Two indeterminate lumbar vertebral lesions at L3 (favor a benign hemangioma given CT and MRI appearance) and L4 (indeterminate). Repeat Lumbar MRI can be utilized for surveillance.   4. Generally age-appropriate underlying spinal degeneration. No significant spinal stenosis.         COGNITION: Overall cognitive status: Within functional limits for tasks assessed                                  SENSATION: Not tested   POSTURE: Forward head, rounded shoulders, increased thoracic kyphosis, and sacral sitting. Right shoulder lower then left.             Cervical  AROM:    10/05/2022      Flexion WFL     Extension  25*     R ROT 50      L ROT 30*      R SB      L SB                            * Pain              (Blank rows = not tested)              UE Measurements       Upper Extremity Right 10/05/2022 Left 10/05/2022    A/PROM MMT A/PROM MMT  Shoulder Flexion 160* 4 150 4*  Shoulder Extension          Shoulder Abduction WFL (scaption) 4-* WFL (scaption)* 4-*  Shoulder Adduction          Shoulder Internal Rotation Reaches to opposite T12 rib* 4+ Reaches to opposite T12 rib* 4*  Shoulder External Rotation Reaches to top of ipsilateral shoulder  blade 4 Reaches to top of ipsilateral shoulder blade 4-*  Elbow Flexion          Elbow Extension          Wrist Flexion          Wrist Extension          Wrist Supination          Wrist Pronation          Wrist Ulnar Deviation          Wrist Radial Deviation          Grip Strength NA   NA                          (Blank rows = not tested)                       *  pain Right handed        JOINT MOBILITY TESTING:  hypomobility noted in cervical spine   PALPATION:  Tenderness to palpation along left UT, shoulder blade and lat and bilateral suboccipitals             TODAY'S TREATMENT:                                                                                                                                         DATE:  10/18/2022 Therapeutic Exercise:    Aerobic: Supine:  cervical rotation R x15 5" holds then with slight left ROT - full painfree ROM 10 minutes total Prone:    Seated:     s/l:  Neuromuscular Re-education: bobble heads at wall PT assist - verbal and tactile cues and then with towel roll and shoulder ER x25 5"hold with chin tuck Manual Therapy: cervical traction with machine and PT with pressure medium/low pressure intermittent 15 minutes total - tingling initially then subsided Therapeutic Activity: Self Care: Trigger Point Dry Needling:  Modalities:      PATIENT EDUCATION:  Education details: on HEP, cervical traction and posture, on home unit  Person educated: Patient Education method: Explanation, Demonstration, and Handouts Education comprehension: verbalized understanding'   HOME EXERCISE PROGRAM: [email or web address]   ASSESSMENT:   CLINICAL IMPRESSION: 10/18/2022 Added cervical traction on machine and this was tolerated moderately well with reduced tingling in left arm and less pain in left shoulder blade after. Added repeated cervical ROT within pain-free ROM and slowing increased left ROT which is typically provacative motion. Improved left ROT noted  with repetition, added this to HEP. Reduced symptoms noted end of session. Will continue with current POC.   Eval: Patient presents with left shoulder blade and shoulder pain that are exacerbated and improved with cervical positioning and postures. Patient presents with pain, posture deficits, and ROM restrictions that are limiting overall function at home while taking care of his wife and his sleep quality. Patient would greatly benefit from skilled PT to return patient to optimal QOL.   OBJECTIVE IMPAIRMENTS: decreased activity tolerance, decreased mobility, difficulty walking, decreased ROM, decreased strength, impaired UE functional use, improper body mechanics, postural dysfunction, and pain.    ACTIVITY LIMITATIONS: lifting, sleeping, reach over head, locomotion level, and caring for others   PARTICIPATION LIMITATIONS: interpersonal relationship and occupation   PERSONAL FACTORS: Age, Fitness, and Past/current experiences are also affecting patient's functional outcome.    REHAB POTENTIAL: Good   CLINICAL DECISION MAKING: Stable/uncomplicated   EVALUATION COMPLEXITY: Low     GOALS: Goals reviewed with patient? yes   SHORT TERM GOALS: Target date: 11/02/2022  Patient will be independent in self management strategies to improve quality of life and functional outcomes. Baseline: New Program Goal status: INITIAL   2.  Patient will report at least 50% improvement in overall symptoms and/or function to demonstrate improved functional mobility Baseline:  0% better Goal status: INITIAL   3.  Patient will be able to demonstrate painfree cervical ROM  Baseline: painful Goal status: INITIAL   4.  Patient will report improved sleeping with pillow/support to reduce stress on neck at night.  Baseline:  Goal status: INITIAL       LONG TERM GOALS: Target date: 11/30/2022    Patient will report at least 75% improvement in overall symptoms and/or function to demonstrate improved functional  mobility Baseline: 0% better Goal status: INITIAL   2.  Patient will be able to demonstrate at least 50 degrees of cervical rotation in both directions Baseline:  Goal status: INITIAL   3.  Patient will be able to demonstrate at least 4/5 MMT in B shoulders to improve shoulder strength. Baseline:  Goal status: INITIAL         PLAN:   PT FREQUENCY: 2x/week   PT DURATION: 8 weeks   PLANNED INTERVENTIONS: Therapeutic exercises, Therapeutic activity, Neuromuscular re-education, Balance training, Gait training, Patient/Family education, Self Care, Joint mobilization, Joint manipulation, Vestibular training, Canalith repositioning, Orthotic/Fit training, DME instructions, Aquatic Therapy, Cognitive remediation, Electrical stimulation, Spinal manipulation, Spinal mobilization, Cryotherapy, Moist heat, Traction, Ultrasound, Ionotophoresis '4mg'$ /ml Dexamethasone, Manual therapy, and Re-evaluation   PLAN FOR NEXT SESSION: neck motions, posture, STM, traction, thoracic mobility, shoulder mobility and strengthening     11:47 AM, 10/18/22 Jerene Pitch, DPT Physical Therapy with [facility]

## 2022-10-18 NOTE — Progress Notes (Signed)
Hematology and Oncology Follow Up Visit  Brian Mays [id number] 1946-03-17 76 y.o. 10/18/2022   Principle Diagnosis:  S/p yttrium-90 intrahepatic therapy - November 2016 Lutathera (Lu 177) injection on 02/01/2018 - s/p cycle 4 SBRT to T6 lesion -- 11/06/2021   Current Therapy:        Somatuline 120 mg monthly IV Iron as indicated  -- Injectafer given on 01/2021   Interim History:  Brian Mays is here today for follow-up and treatment. He is doing fairly well. He continues to care for his wife who is in hospice. This is stressful both he and his daughter.  Chromogranin A last month was up at 163. This has fluctuated in the past. Today's result is pending.  He notes fatigue at times.  No fever, chills, n/v, cough, rash, dizziness, SOB, chest pain, palpitations, abdominal pain or changes in bowel or bladder habits.  No swelling or tenderness in his extremities.  He has some numbness and tingling in the left arm secondary to a pinched nerve in the neck. He is currently in PT for this which seems to be helping.  No falls or syncope reported.  Appetite comes and goes. He is doing his best to stay well hydrated. His weight is stable at 179 lbs.   ECOG Performance Status: 1 - Symptomatic but completely ambulatory  Medications:  Allergies as of 10/18/2022       Reactions   Iohexol Hives    Code: HIVES, Desc: PER [name] @ PRIMARY CARE, PT IS ALLERGIC TO CONTRAST DYE 10/02/08/RM  05/01/10...needs full premeds per our protocol w/ [facility]., Onset Date: [id number]   Iodinated Contrast Media Hives        Medication List        Accurate as of October 18, 2022  1:57 PM. If you have any questions, ask your nurse or doctor.          aspirin EC 81 MG tablet Take 81 mg by mouth every morning.   atorvastatin 80 MG tablet Commonly known as: LIPITOR Take 1 tablet (80 mg total) by mouth daily.   Cholecalciferol 25 MCG (1000 UT) tablet Take 1,000 Units by mouth 2 (two) times daily.    clopidogrel 75 MG tablet Commonly known as: PLAVIX TAKE 1 TABLET BY MOUTH EVERY DAY   cyanocobalamin 1000 MCG tablet Commonly known as: VITAMIN B12 Take 1,000 mcg by mouth every other day.   diphenhydramine-acetaminophen 25-500 MG Tabs tablet Commonly known as: TYLENOL PM Take 1 tablet by mouth at bedtime as needed (sleep/pain).   fluticasone 50 MCG/ACT nasal spray Commonly known as: FLONASE Place 2 sprays into both nostrils daily as needed (sinuses).   hydrocortisone 25 MG suppository Commonly known as: ANUSOL-HC INSERT 1 SUPPOSITORY RECTALLY TWICE A DAY AS NEEDED FOR HEMORRHOIDS   ipratropium 0.03 % nasal spray Commonly known as: Atrovent Place 2 sprays into the nose 3 (three) times daily. Use as needed for nasal drip   lansoprazole 30 MG capsule Commonly known as: PREVACID TAKE 1 CAPSULE (30 MG TOTAL) BY MOUTH DAILY AT 12 NOON.   levothyroxine 50 MCG tablet Commonly known as: SYNTHROID TAKE 1 TABLET BY MOUTH DAILY BEFORE BREAKFAST   losartan 50 MG tablet Commonly known as: COZAAR Take 1 tablet (50 mg total) by mouth daily.   metoprolol tartrate 25 MG tablet Commonly known as: LOPRESSOR Take 0.5 tablets (12.5 mg total) by mouth 2 (two) times daily.   nitroGLYCERIN 0.4 MG SL tablet Commonly known as: NITROSTAT PLACE 1 TABLET  UNDER THE TONGUE EVERY 5 (FIVE) MINUTES X 3 DOSES AS NEEDED FOR CHEST PAIN.   ranolazine 500 MG 12 hr tablet Commonly known as: RANEXA Take 1 tablet (500 mg total) by mouth 2 (two) times daily.   SOMATULINE DEPOT [place] Inject 120 mcg into the skin every 28 (twenty-eight) days. Receives at Dr Antonieta Pert office   SOOTHE XP OP Place 1 drop into both eyes daily as needed (dry eyes).        Allergies:  Allergies  Allergen Reactions   Iohexol Hives     Code: HIVES, Desc: PER [name] @ [facility]IMARY [facility]ARE, PT IS ALLERGIC TO CONTRAST DYE 10/02/08/RM  05/01/10...needs full premeds per our protocol w/ [facility]., Onset Date: [id number]    Iodinated  Contrast Media Hives    Past Medical History, Surgical history, Social history, and Family History were reviewed and updated.  Review of Systems: All other 10 point review of systems is negative.   Physical Exam:  weight is 179 lb 12.8 oz (81.6 kg). His oral temperature is 97.9 F (36.6 C). His blood pressure is 133/61 and his pulse is 67. His respiration is 17 and oxygen saturation is 100%.   Wt Readings from Last 3 Encounters:  10/18/22 179 lb 12.8 oz (81.6 kg)  09/30/22 181 lb 12.8 oz (82.5 kg)  09/17/22 180 lb 0.6 oz (81.7 kg)    Ocular: Sclerae unicteric, pupils equal, round and reactive to light Ear-nose-throat: Oropharynx clear, dentition fair Lymphatic: No cervical or supraclavicular adenopathy Lungs no rales or rhonchi, good excursion bilaterally Heart regular rate and rhythm, no murmur appreciated Abd soft, nontender, positive bowel sounds MSK no focal spinal tenderness, no joint edema Neuro: non-focal, well-oriented, appropriate affect Breasts: Deferred   Lab Results  Component Value Date   WBC 7.4 10/18/2022   HGB 11.6 (L) 10/18/2022   HCT 35.8 (L) 10/18/2022   MCV 94.2 10/18/2022   PLT 125 (L) 10/18/2022   Lab Results  Component Value Date   FERRITIN 113 06/14/2022   IRON 60 06/14/2022   TIBC 280 06/14/2022   UIBC 220 06/14/2022   IRONPCTSAT 21 06/14/2022   Lab Results  Component Value Date   RETICCTPCT 1.2 06/14/2022   RBC 3.80 (L) 10/18/2022   No results found for: "KPAFRELGTCHN", "LAMBDASER", "KAPLAMBRATIO" No results found for: "IGGSERUM", "IGA", "IGMSERUM" No results found for: "TOTALPROTELP", "ALBUMINELP", "A1GS", "A2GS", "BETS", "BETA2SER", "GAMS", "MSPIKE", "SPEI"   Chemistry      Component Value Date/Time   NA 139 09/17/2022 1312   NA 141 06/22/2022 0942   NA 144 10/04/2017 0904   NA 141 07/23/2016 1255   K 5.1 09/17/2022 1312   K 3.8 10/04/2017 0904   K 4.1 07/23/2016 1255   CL 103 09/17/2022 1312   CL 103 10/04/2017 0904   CO2 32  09/17/2022 1312   CO2 28 10/04/2017 0904   CO2 27 07/23/2016 1255   BUN 20 09/17/2022 1312   BUN 20 06/22/2022 0942   BUN 14 10/04/2017 0904   BUN 13.8 07/23/2016 1255   CREATININE 1.07 09/17/2022 1312   CREATININE 0.8 10/04/2017 0904   CREATININE 1.0 07/23/2016 1255      Component Value Date/Time   CALCIUM 9.5 09/17/2022 1312   CALCIUM 9.4 10/04/2017 0904   CALCIUM 9.2 07/23/2016 1255   ALKPHOS 50 09/17/2022 1312   ALKPHOS 64 10/04/2017 0904   ALKPHOS 77 07/23/2016 1255   AST 20 09/17/2022 1312   AST 23 07/23/2016 1255   ALT 12 09/17/2022 1312  ALT 22 10/04/2017 0904   ALT 13 07/23/2016 1255   BILITOT 0.8 09/17/2022 1312   BILITOT 1.13 07/23/2016 1255       Impression and Plan: Mr. Doughtie is a very pleasant 76 yo caucasian gentleman with metastatic low-grade neuroendocrine carcinoma with hepatic metastasis.   Chromogranin A pending.  Somatuline given.  Follow-up in 1 month.   Lottie Dawson, NP 12/18/20231:57 PM

## 2022-10-19 LAB — CHROMOGRANIN A: Chromogranin A (ng/mL): 191.9 ng/mL — ABNORMAL HIGH (ref 0.0–101.8)

## 2022-10-20 ENCOUNTER — Ambulatory Visit (INDEPENDENT_AMBULATORY_CARE_PROVIDER_SITE_OTHER): Payer: Medicare HMO | Admitting: Physical Therapy

## 2022-10-20 ENCOUNTER — Encounter: Payer: Self-pay | Admitting: Physical Therapy

## 2022-10-20 DIAGNOSIS — M898X1 Other specified disorders of bone, shoulder: Secondary | ICD-10-CM

## 2022-10-20 DIAGNOSIS — M6281 Muscle weakness (generalized): Secondary | ICD-10-CM | POA: Diagnosis not present

## 2022-10-20 DIAGNOSIS — M25512 Pain in left shoulder: Secondary | ICD-10-CM | POA: Diagnosis not present

## 2022-10-20 NOTE — Therapy (Signed)
OUTPATIENT PHYSICAL THERAPY TREATMENT NOTE   Patient Name: Brian Mays MRN: [id number] DOB:1946-08-10, 76 y.o., male Today's Date: 10/20/2022  PCP: Loralee Pacas, MD   REFERRING PROVIDER: Loralee Pacas, MD  END OF SESSION:   PT End of Session - 10/20/22 1220     Visit Number 5    Number of Visits 16    Date for PT Re-Evaluation 11/30/22    Authorization Type [employer] medicare, VL 30    Authorization - Visit Number 5    Authorization - Number of Visits 30    Progress Note Due on Visit 10    PT Start Time 4196    PT Stop Time 2229    PT Time Calculation (min) 38 min    Activity Tolerance Patient tolerated treatment well             Past Medical History:  Diagnosis Date   Anemia    in past   Cataract    Colon polyps    Diverticulosis    Gallstones    GERD (gastroesophageal reflux disease)    Heart attack ([place])    mild, Spring 2017   Heart murmur    Hemorrhoid    Hiatal hernia    HTN (hypertension)    Hyperlipidemia    Hypothyroidism    Iron deficiency anemia due to chronic blood loss 03/29/2019   Iron malabsorption 03/29/2019   Metastatic carcinoma ([place]) 2010   Dr Jonette Eva   Neuroendocrine cancer [facility]) 08/2021   Pancreatitis 1998   chronic   Past Surgical History:  Procedure Laterality Date   [place] N/A 02/10/2016   Procedure: Left Heart Cath and Coronary Angiography;  Surgeon: Adrian Prows, MD;  Location: [place] [facility];  Service: Cardiovascular;  Laterality: N/A;   CARDIAC CATHETERIZATION  02/10/2016   Procedure: Coronary/Graft Atherectomy;  Surgeon: Adrian Prows, MD;  Location: [place] [facility];  Service: Cardiovascular;;   CARDIAC CATHETERIZATION  02/10/2016   Procedure: Coronary Stent Intervention;  Surgeon: Adrian Prows, MD;  Location: [place] [facility];  Service: Cardiovascular;;   CATARACT EXTRACTION W/ INTRAOCULAR LENS  IMPLANT, BILATERAL Bilateral    CORONARY STENT INTERVENTION N/A 12/16/2021   Procedure:  CORONARY STENT INTERVENTION;  Surgeon: Early Osmond, MD;  Location: [place] [facility];  Service: Cardiovascular;  Laterality: N/A;   IR GENERIC HISTORICAL  12/30/2015   IR RADIOLOGIST EVAL & MGMT 12/30/2015 Aletta Edouard, MD [facility] INTERV RAD   IR GENERIC HISTORICAL  11/09/2016   IR RADIOLOGIST EVAL & MGMT 11/09/2016 Aletta Edouard, MD [facility] INTERV RAD   IR RADIOLOGIST EVAL & MGMT  03/01/2017   IR RADIOLOGIST EVAL & MGMT  06/29/2017   LAPAROSCOPIC CHOLECYSTECTOMY  1999   LEFT HEART CATH AND CORONARY ANGIOGRAPHY N/A 12/16/2021   Procedure: LEFT HEART CATH AND CORONARY ANGIOGRAPHY;  Surgeon: Early Osmond, MD;  Location: [place] [facility];  Service: Cardiovascular;  Laterality: N/A;   LIVER BIOPSY  2010   RADIOACTIVE SEED IMPLANT  X 3   "to my liver"   TUMOR EXCISION  01/2009   Carcinoil Resection    TUMOR REMOVAL     from small intestine   Patient Active Problem List   Diagnosis Date Noted   Secondary malignant neoplasm of bone ([place]) 02/08/2022   Prediabetes 09/03/2021   Near syncope 04/22/2020   Iron deficiency anemia due to chronic blood loss 03/29/2019   Iron malabsorption 03/29/2019   Lumbar radiculopathy 08/16/2017  CAD (coronary artery disease) 09/21/2016   Unstable angina ([place]) 02/09/2016   Vitamin D deficiency 03/11/2015   Metastatic malignant neuroendocrine tumor to liver ([place]) 02/14/2015   Hyperglycemia 04/03/2012   Diarrhea 03/29/2012   B12 deficiency 04/19/2011   Antiplatelet or antithrombotic long-term use 03/30/2011   TOBACCO USE, QUIT 10/13/2009   Unspecified vitamin D deficiency 06/19/2009   Neoplasm by body site 12/23/2008   DIVERTICULOSIS, COLON 11/08/2008   ABDOMINAL PAIN 10/02/2008   Hypothyroidism 12/13/2007   HEMORRHOIDS, NOS 12/13/2007   Belching 12/13/2007   COLONIC POLYPS, HX OF 12/13/2007   Essential hypertension 09/15/2007   Gastroesophageal reflux disease 09/15/2007   PANCREATITIS, CHRONIC 09/15/2007   CARDIAC MURMUR 09/15/2007   SNORING  09/15/2007       REFERRING DIAG: S39.012A (ICD-10-CM) - Back strain, initial encounter S46.912A (ICD-10-CM) - Muscle strain of left upper arm, initial encounter   THERAPY DIAG:  Acute pain of left shoulder   Muscle weakness (generalized)   Shoulder blade pain   Rationale for Evaluation and Treatment: Rehabilitation   ONSET DATE: 3-4 weeks   SUBJECTIVE:                                                                                                                                                                                       SUBJECTIVE STATEMENT: 10/20/2022 States that he didn't sleep well the last night and he had a bit of a cough. States that the cough kept him awake and had to go to [place] so he didn't get too much exercise in yesterday.   Eval: Patient's wife is bed bound (weighs 220 pounds) and when she has a bowel movement she has to physically hold her up with her left hand while he helps her with her right. States this has been going on for 4 months. States she thought he was getting better but when he was at the dentist it felt like it irritated the spot on the back of the shoulder blade. States that sometimes it hurts by the shoulder blade, sometimes in the left arm. Pain is worse at night when he lays on his right side. States that ice helps. States sometimes just reaching out with his left arm bothers him.    She is going into a hospice home for 5 days to give him some rest in his shoulder.    PERTINENT HISTORY: heart attack, HTN, Metastatic cancer (being treated and monitored), cardiac catherization   PAIN:  Are you having pain? Yes: NPRS scale: .5/10 Pain location: neck left side and into left arm Pain description: slight discomfort  Aggravating factors: using computer,  Relieving factors: ice, rest, repositioning  PRECAUTIONS: None   WEIGHT BEARING RESTRICTIONS: No   FALLS:  Has patient fallen in last 6 months? 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      OBJECTIVE:    DIAGNOSTIC FINDINGS:  Thoracic spine MRI 05/06/22 IMPRESSION: 1. Stable small enhancing metastatic lesions involving T6 and T7. The T12 lesions appear improved without definite contrast enhancement on today's study. 2. Stable T5 hemangioma. 3. No new metastatic lesions are identified. 4. No significant thoracic disc protrusions or spinal stenosis.   Spinal  MRI 08/13/22 IMPRESSION: 1. No cervical spine metastatic disease identified.   2. Continued stable MRI appearance of the thoracic spine, with unchanged small treated T6 and T12 metastasis, and stable suspected small T7 body metastasis (T5 benign hemangioma).   3. Two indeterminate lumbar vertebral lesions at L3 (favor a benign hemangioma given CT and MRI appearance) and L4 (indeterminate). Repeat Lumbar MRI can be utilized for surveillance.   4. Generally age-appropriate underlying spinal degeneration. No significant spinal stenosis.         COGNITION: Overall cognitive status: Within functional limits for tasks assessed                                  SENSATION: Not tested   POSTURE: Forward head, rounded shoulders, increased thoracic kyphosis, and sacral sitting. Right shoulder lower then left.             Cervical  AROM:    10/05/2022      Flexion WFL     Extension  25*     R ROT 50      L ROT 30*      R SB      L SB                            * Pain              (Blank rows = not tested)              UE Measurements       Upper Extremity Right 10/05/2022 Left 10/05/2022    A/PROM MMT A/PROM MMT  Shoulder Flexion 160* 4 150 4*  Shoulder Extension          Shoulder Abduction WFL (scaption) 4-* WFL (scaption)* 4-*  Shoulder Adduction          Shoulder Internal Rotation Reaches to opposite T12 rib* 4+ Reaches to  opposite T12 rib* 4*  Shoulder External Rotation Reaches to top of ipsilateral shoulder blade 4 Reaches to top of ipsilateral shoulder blade 4-*  Elbow Flexion          Elbow Extension          Wrist Flexion          Wrist Extension          Wrist Supination          Wrist Pronation          Wrist Ulnar Deviation          Wrist Radial Deviation          Grip Strength NA   NA                          (  Blank rows = not tested)                       * pain Right handed        JOINT MOBILITY TESTING:  hypomobility noted in cervical spine   PALPATION:  Tenderness to palpation along left UT, shoulder blade and lat and bilateral suboccipitals             TODAY'S TREATMENT:                                                                                                                                         DATE:  10/20/2022 Therapeutic Exercise:    Aerobic: Supine:   Prone: scapular protraction and retraction x25 total 5" holds in each position B PT assist to no assistance, shoulder flexion x30 5" holds, chin tucks 2x5, PT assist    standing: shoulder flexion at wall 2x10 5" holds, shoulder ER at wall with towels for form 3x10 B    s/l: Neuromuscular Re-education:   Manual Therapy: STM to B thoracic paraspinals and rhomboids - tolerated mildly well. PA to ribs grade II  Therapeutic Activity Self Care: Trigger Point Dry Needling:  Modalities:      PATIENT EDUCATION:  Education details: on HEP  and rationale for interventions, staying in mild/moderate intensity for stretches  Person educated: Patient Education method: Explanation, Demonstration, and Handouts Education comprehension: verbalized understanding[place]: [id number]   ASSESSMENT:   CLINICAL IMPRESSION: 10/20/2022 Session focused on prone exercises which were tolerated well. Improved muscle relaxation after laying in prone position for a few minutes. Added prone exercises and standing exercises to  HEP and instructed patient to perform stretches to mild to moderate intensity to not over do things.   Eval: Patient presents with left shoulder blade and shoulder pain that are exacerbated and improved with cervical positioning and postures. Patient presents with pain, posture deficits, and ROM restrictions that are limiting overall function at home while taking care of his wife and his sleep quality. Patient would greatly benefit from skilled PT to return patient to optimal QOL.   OBJECTIVE IMPAIRMENTS: decreased activity tolerance, decreased mobility, difficulty walking, decreased ROM, decreased strength, impaired UE functional use, improper body mechanics, postural dysfunction, and pain.    ACTIVITY LIMITATIONS: lifting, sleeping, reach over head, locomotion level, and caring for others   PARTICIPATION LIMITATIONS: interpersonal relationship and occupation   PERSONAL FACTORS: Age, Fitness, and Past/current experiences are also affecting patient's functional outcome.    REHAB POTENTIAL: Good   CLINICAL DECISION MAKING: Stable/uncomplicated   EVALUATION COMPLEXITY: Low     GOALS: Goals reviewed with patient? yes   SHORT TERM GOALS: Target date: 11/02/2022  Patient will be independent in self management strategies to improve quality of life and functional outcomes. Baseline: New Program Goal status: INITIAL   2.  Patient will report  at least 50% improvement in overall symptoms and/or function to demonstrate improved functional mobility Baseline: 0% better Goal status: INITIAL   3.  Patient will be able to demonstrate painfree cervical ROM  Baseline: painful Goal status: INITIAL   4.  Patient will report improved sleeping with pillow/support to reduce stress on neck at night.  Baseline:  Goal status: INITIAL       LONG TERM GOALS: Target date: 11/30/2022    Patient will report at least 75% improvement in overall symptoms and/or function to demonstrate improved functional  mobility Baseline: 0% better Goal status: INITIAL   2.  Patient will be able to demonstrate at least 50 degrees of cervical rotation in both directions Baseline:  Goal status: INITIAL   3.  Patient will be able to demonstrate at least 4/5 MMT in B shoulders to improve shoulder strength. Baseline:  Goal status: INITIAL         PLAN:   PT FREQUENCY: 2x/week   PT DURATION: 8 weeks   PLANNED INTERVENTIONS: Therapeutic exercises, Therapeutic activity, Neuromuscular re-education, Balance training, Gait training, Patient/Family education, Self Care, Joint mobilization, Joint manipulation, Vestibular training, Canalith repositioning, Orthotic/Fit training, DME instructions, Aquatic Therapy, Cognitive remediation, Electrical stimulation, Spinal manipulation, Spinal mobilization, Cryotherapy, Moist heat, Traction, Ultrasound, Ionotophoresis '4mg'$ /ml Dexamethasone, Manual therapy, and Re-evaluation   PLAN FOR NEXT SESSION: neck motions, posture, STM, traction, thoracic mobility, shoulder mobility and strengthening     1:18 PM, 10/20/22 Jerene Pitch, DPT Physical Therapy with [facility]

## 2022-10-26 ENCOUNTER — Encounter: Payer: Medicare HMO | Admitting: Physical Therapy

## 2022-10-27 ENCOUNTER — Other Ambulatory Visit: Payer: Self-pay | Admitting: Cardiovascular Disease

## 2022-10-28 ENCOUNTER — Ambulatory Visit (INDEPENDENT_AMBULATORY_CARE_PROVIDER_SITE_OTHER): Payer: Medicare HMO | Admitting: Physical Therapy

## 2022-10-28 ENCOUNTER — Encounter: Payer: Self-pay | Admitting: Physical Therapy

## 2022-10-28 DIAGNOSIS — M898X1 Other specified disorders of bone, shoulder: Secondary | ICD-10-CM | POA: Diagnosis not present

## 2022-10-28 DIAGNOSIS — M6281 Muscle weakness (generalized): Secondary | ICD-10-CM | POA: Diagnosis not present

## 2022-10-28 DIAGNOSIS — M25512 Pain in left shoulder: Secondary | ICD-10-CM | POA: Diagnosis not present

## 2022-10-28 NOTE — Therapy (Signed)
OUTPATIENT PHYSICAL THERAPY TREATMENT NOTE   Patient Name: Brian Mays MRN: [id number] DOB:January 30, 1946, 76 y.o., male Today's Date: 10/28/2022  PCP: Loralee Pacas, MD   REFERRING PROVIDER: Loralee Pacas, MD  END OF SESSION:   PT End of Session - 10/28/22 1211     Visit Number 6    Number of Visits 16    Date for PT Re-Evaluation 11/30/22    Authorization Type [employer] medicare, VL 30    Authorization - Visit Number 6    Authorization - Number of Visits 30    Progress Note Due on Visit 10    PT Start Time 1216    PT Stop Time 1254    PT Time Calculation (min) 38 min    Activity Tolerance Patient tolerated treatment well             Past Medical History:  Diagnosis Date   Anemia    in past   Cataract    Colon polyps    Diverticulosis    Gallstones    GERD (gastroesophageal reflux disease)    Heart attack ([place])    mild, Spring 2017   Heart murmur    Hemorrhoid    Hiatal hernia    HTN (hypertension)    Hyperlipidemia    Hypothyroidism    Iron deficiency anemia due to chronic blood loss 03/29/2019   Iron malabsorption 03/29/2019   Metastatic carcinoma ([place]) 2010   Dr Jonette Eva   Neuroendocrine cancer [facility]) 08/2021   Pancreatitis 1998   chronic   Past Surgical History:  Procedure Laterality Date   [place] N/A 02/10/2016   Procedure: Left Heart Cath and Coronary Angiography;  Surgeon: Adrian Prows, MD;  Location: [place] [facility];  Service: Cardiovascular;  Laterality: N/A;   CARDIAC CATHETERIZATION  02/10/2016   Procedure: Coronary/Graft Atherectomy;  Surgeon: Adrian Prows, MD;  Location: [place] [facility];  Service: Cardiovascular;;   CARDIAC CATHETERIZATION  02/10/2016   Procedure: Coronary Stent Intervention;  Surgeon: Adrian Prows, MD;  Location: [place] [facility];  Service: Cardiovascular;;   CATARACT EXTRACTION W/ INTRAOCULAR LENS  IMPLANT, BILATERAL Bilateral    CORONARY STENT INTERVENTION N/A 12/16/2021   Procedure:  CORONARY STENT INTERVENTION;  Surgeon: Early Osmond, MD;  Location: [place] [facility];  Service: Cardiovascular;  Laterality: N/A;   IR GENERIC HISTORICAL  12/30/2015   IR RADIOLOGIST EVAL & MGMT 12/30/2015 Aletta Edouard, MD [facility] INTERV RAD   IR GENERIC HISTORICAL  11/09/2016   IR RADIOLOGIST EVAL & MGMT 11/09/2016 Aletta Edouard, MD [facility] INTERV RAD   IR RADIOLOGIST EVAL & MGMT  03/01/2017   IR RADIOLOGIST EVAL & MGMT  06/29/2017   LAPAROSCOPIC CHOLECYSTECTOMY  1999   LEFT HEART CATH AND CORONARY ANGIOGRAPHY N/A 12/16/2021   Procedure: LEFT HEART CATH AND CORONARY ANGIOGRAPHY;  Surgeon: Early Osmond, MD;  Location: [place] [facility];  Service: Cardiovascular;  Laterality: N/A;   LIVER BIOPSY  2010   RADIOACTIVE SEED IMPLANT  X 3   "to my liver"   TUMOR EXCISION  01/2009   Carcinoil Resection    TUMOR REMOVAL     from small intestine   Patient Active Problem List   Diagnosis Date Noted   Secondary malignant neoplasm of bone ([place]) 02/08/2022   Prediabetes 09/03/2021   Near syncope 04/22/2020   Iron deficiency anemia due to chronic blood loss 03/29/2019   Iron malabsorption 03/29/2019   Lumbar radiculopathy 08/16/2017  CAD (coronary artery disease) 09/21/2016   Unstable angina ([place]) 02/09/2016   Vitamin D deficiency 03/11/2015   Metastatic malignant neuroendocrine tumor to liver ([place]) 02/14/2015   Hyperglycemia 04/03/2012   Diarrhea 03/29/2012   B12 deficiency 04/19/2011   Antiplatelet or antithrombotic long-term use 03/30/2011   TOBACCO USE, QUIT 10/13/2009   Unspecified vitamin D deficiency 06/19/2009   Neoplasm by body site 12/23/2008   DIVERTICULOSIS, COLON 11/08/2008   ABDOMINAL PAIN 10/02/2008   Hypothyroidism 12/13/2007   HEMORRHOIDS, NOS 12/13/2007   Belching 12/13/2007   COLONIC POLYPS, HX OF 12/13/2007   Essential hypertension 09/15/2007   Gastroesophageal reflux disease 09/15/2007   PANCREATITIS, CHRONIC 09/15/2007   CARDIAC MURMUR 09/15/2007   SNORING  09/15/2007       REFERRING DIAG: S39.012A (ICD-10-CM) - Back strain, initial encounter S46.912A (ICD-10-CM) - Muscle strain of left upper arm, initial encounter   THERAPY DIAG:  Acute pain of left shoulder   Muscle weakness (generalized)   Shoulder blade pain   Rationale for Evaluation and Treatment: Rehabilitation   ONSET DATE: 3-4 weeks   SUBJECTIVE:                                                                                                                                                                                       SUBJECTIVE STATEMENT: 10/28/2022 States that he ordered the neck thing but he hasn't gotten it. States the pain between the shoulder blades is pretty much gone but still some tightness in the neck and a little tingling in the arm. States overall he feels 90% better.  Eval: Patient's wife is bed bound (weighs 220 pounds) and when she has a bowel movement she has to physically hold her up with her left hand while he helps her with her right. States this has been going on for 4 months. States she thought he was getting better but when he was at the dentist it felt like it irritated the spot on the back of the shoulder blade. States that sometimes it hurts by the shoulder blade, sometimes in the left arm. Pain is worse at night when he lays on his right side. States that ice helps. States sometimes just reaching out with his left arm bothers him.    She is going into a hospice home for 5 days to give him some rest in his shoulder.    PERTINENT HISTORY: heart attack, HTN, Metastatic cancer (being treated and monitored), cardiac catherization   PAIN:  Are you having pain? Yes: NPRS scale: 0/10 Pain location: neck left side and into left arm Pain description: slight discomfort  Aggravating factors: using computer,  Relieving factors: ice, rest,  repositioning   PRECAUTIONS: None   WEIGHT BEARING RESTRICTIONS: No   FALLS:  Has patient fallen in last 6  months? 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      OBJECTIVE:    DIAGNOSTIC FINDINGS:  Thoracic spine MRI 05/06/22 IMPRESSION: 1. Stable small enhancing metastatic lesions involving T6 and T7. The T12 lesions appear improved without definite contrast enhancement on today's study. 2. Stable T5 hemangioma. 3. No new metastatic lesions are identified. 4. No significant thoracic disc protrusions or spinal stenosis.   Spinal  MRI 08/13/22 IMPRESSION: 1. No cervical spine metastatic disease identified.   2. Continued stable MRI appearance of the thoracic spine, with unchanged small treated T6 and T12 metastasis, and stable suspected small T7 body metastasis (T5 benign hemangioma).   3. Two indeterminate lumbar vertebral lesions at L3 (favor a benign hemangioma given CT and MRI appearance) and L4 (indeterminate). Repeat Lumbar MRI can be utilized for surveillance.   4. Generally age-appropriate underlying spinal degeneration. No significant spinal stenosis.         COGNITION: Overall cognitive status: Within functional limits for tasks assessed                                  SENSATION: Not tested   POSTURE: Forward head, rounded shoulders, increased thoracic kyphosis, and sacral sitting. Right shoulder lower then left.             Cervical  AROM:    10/28/2022      Flexion WFL     Extension  25*     R ROT 50      L ROT 40*     R SB      L SB                            * tingling/pain (Blank rows = not tested)              UE Measurements       Upper Extremity Right 10/28/2022 Left 10/28/2022    A/PROM MMT A/PROM MMT  Shoulder Flexion 160 4+ 160 4+  Shoulder Extension          Shoulder Abduction WFL (scaption) 4 WFL (scaption)*   Shoulder Adduction          Shoulder Internal Rotation  Reaches to opposite L3 SP 4+ Reaches to opposite T12SP 4+  Shoulder External Rotation Reaches to top of ipsilateral shoulder blade 4+ Reaches to top of ipsilateral shoulder blade 4+  Elbow Flexion          Elbow Extension          Wrist Flexion          Wrist Extension          Wrist Supination          Wrist Pronation          Wrist Ulnar Deviation          Wrist Radial Deviation          Grip Strength NA   NA                          (  Blank rows = not tested)                       * pain Right handed      TODAY'S TREATMENT:                                                                                                                                         DATE:  10/28/2022 Therapeutic Exercise:    Aerobic: Supine:   Prone: scapular protraction and retraction  x3 45 second bouts 5" holds in each position B    standing: shoulder pec stretch at wall 3 minutes total, cervical ROT B x25. Chin tuck PT assist x25 5" holds    seated: chin tucks x25 5" holds  Neuromuscular Re-education:   Manual Therapy: STM to B thoracic paraspinals and rhomboids and UT - with percussion gun - tolerated well 12 minutes Therapeutic Activity Self Care: Trigger Point Dry Needling:  Modalities:      PATIENT EDUCATION:  Education details: on HEP  and progress made, POC Person educated: Patient Education method: Explanation, Demonstration, and Handouts Education comprehension: verbalized understanding'   HOME EXERCISE PROGRAM: [id number]   ASSESSMENT:   CLINICAL IMPRESSION: 10/28/2022 Overall patient progressing towards goals but continues to have limitations in neck ROM and pain-free neck motion. Tolerated session well and reminded patient of HEP and use of percussion gun for continued muscle relaxation.  Eval: Patient presents with left shoulder blade and shoulder pain that are exacerbated and improved with cervical positioning and postures. Patient presents with pain, posture deficits, and ROM  restrictions that are limiting overall function at home while taking care of his wife and his sleep quality. Patient would greatly benefit from skilled PT to return patient to optimal QOL.   OBJECTIVE IMPAIRMENTS: decreased activity tolerance, decreased mobility, difficulty walking, decreased ROM, decreased strength, impaired UE functional use, improper body mechanics, postural dysfunction, and pain.    ACTIVITY LIMITATIONS: lifting, sleeping, reach over head, locomotion level, and caring for others   PARTICIPATION LIMITATIONS: interpersonal relationship and occupation   PERSONAL FACTORS: Age, Fitness, and Past/current experiences are also affecting patient's functional outcome.    REHAB POTENTIAL: Good   CLINICAL DECISION MAKING: Stable/uncomplicated   EVALUATION COMPLEXITY: Low     GOALS: Goals reviewed with patient? yes   SHORT TERM GOALS: Target date: 11/02/2022  Patient will be independent in self management strategies to improve quality of life and functional outcomes. Baseline: New Program Goal status: MET   2.  Patient will report at least 50% improvement in overall symptoms and/or function to demonstrate improved functional mobility Baseline: 0% better Goal status: MET   3.  Patient will be able to demonstrate painfree cervical ROM  Baseline: painful Goal status: PROGRESSING   4.  Patient will report improved sleeping with pillow/support to reduce stress on neck at night.  Baseline:  Goal status:  MET       LONG TERM GOALS: Target date: 11/30/2022    Patient will report at least 75% improvement in overall symptoms and/or function to demonstrate improved functional mobility Baseline: 0% better Goal status: MET   2.  Patient will be able to demonstrate at least 50 degrees of cervical rotation in both directions Baseline:  Goal status: PROGRESSING   3.  Patient will be able to demonstrate at least 4/5 MMT in B shoulders to improve shoulder strength. Baseline:   Goal status: MET         PLAN:   PT FREQUENCY: 2x/week   PT DURATION: 8 weeks   PLANNED INTERVENTIONS: Therapeutic exercises, Therapeutic activity, Neuromuscular re-education, Balance training, Gait training, Patient/Family education, Self Care, Joint mobilization, Joint manipulation, Vestibular training, Canalith repositioning, Orthotic/Fit training, DME instructions, Aquatic Therapy, Cognitive remediation, Electrical stimulation, Spinal manipulation, Spinal mobilization, Cryotherapy, Moist heat, Traction, Ultrasound, Ionotophoresis 9m/ml Dexamethasone, Manual therapy, and Re-evaluation   PLAN FOR NEXT SESSION: neck motions, posture, STM, traction, thoracic mobility, shoulder mobility and strengthening     12:58 PM, 10/28/22 MJerene Pitch DPT Physical Therapy with [facility]

## 2022-11-02 ENCOUNTER — Ambulatory Visit (INDEPENDENT_AMBULATORY_CARE_PROVIDER_SITE_OTHER): Payer: Medicare HMO | Admitting: Physical Therapy

## 2022-11-02 ENCOUNTER — Encounter: Payer: Self-pay | Admitting: Physical Therapy

## 2022-11-02 DIAGNOSIS — M898X1 Other specified disorders of bone, shoulder: Secondary | ICD-10-CM | POA: Diagnosis not present

## 2022-11-02 DIAGNOSIS — M25512 Pain in left shoulder: Secondary | ICD-10-CM

## 2022-11-02 DIAGNOSIS — M6281 Muscle weakness (generalized): Secondary | ICD-10-CM

## 2022-11-02 NOTE — Therapy (Signed)
OUTPATIENT PHYSICAL THERAPY TREATMENT NOTE  PHYSICAL THERAPY DISCHARGE SUMMARY  Visits from Start of Care: 7  Current functional level related to goals / functional outcomes: See below   Remaining deficits: See below   Education / Equipment: See below   Patient agrees to discharge. Patient goals were partially met. Patient is being discharged due to meeting the stated rehab goals.  Patient Name: Brian Mays MRN: [id number] DOB:[birth year], 77 y.o., male Today's Date: 11/02/2022  PCP: Loralee Pacas, MD   REFERRING PROVIDER: Loralee Pacas, MD  END OF SESSION:   PT End of Session - 11/02/22 1216     Visit Number 7    Number of Visits 16    Date for PT Re-Evaluation 11/30/22    Authorization Type aetna medicare, VL 30    Authorization - Visit Number 7    Authorization - Number of Visits 30    Progress Note Due on Visit 10    PT Start Time 1217    PT Stop Time 1249    PT Time Calculation (min) 32 min    Activity Tolerance Patient tolerated treatment well             Past Medical History:  Diagnosis Date   Anemia    in past   Cataract    Colon polyps    Diverticulosis    Gallstones    GERD (gastroesophageal reflux disease)    Heart attack ([place])    mild, Spring 2017   Heart murmur    Hemorrhoid    Hiatal hernia    HTN (hypertension)    Hyperlipidemia    Hypothyroidism    Iron deficiency anemia due to chronic blood loss 03/29/2019   Iron malabsorption 03/29/2019   Metastatic carcinoma ([place]) 2010   Dr Jonette Eva   Neuroendocrine cancer [facility]) 08/2021   Pancreatitis 1998   chronic   Past Surgical History:  Procedure Laterality Date   [place] N/A 02/10/2016   Procedure: Left Heart Cath and Coronary Angiography;  Surgeon: Adrian Prows, MD;  Location: [place] [facility];  Service: Cardiovascular;  Laterality: N/A;   CARDIAC CATHETERIZATION  02/10/2016   Procedure: Coronary/Graft Atherectomy;  Surgeon: Adrian Prows, MD;   Location: [place] [facility];  Service: Cardiovascular;;   CARDIAC CATHETERIZATION  02/10/2016   Procedure: Coronary Stent Intervention;  Surgeon: Adrian Prows, MD;  Location: [place] [facility];  Service: Cardiovascular;;   CATARACT EXTRACTION W/ INTRAOCULAR LENS  IMPLANT, BILATERAL Bilateral    CORONARY STENT INTERVENTION N/A 12/16/2021   Procedure: CORONARY STENT INTERVENTION;  Surgeon: Early Osmond, MD;  Location: [place] [facility];  Service: Cardiovascular;  Laterality: N/A;   IR GENERIC HISTORICAL  12/30/2015   IR RADIOLOGIST EVAL & MGMT 12/30/2015 Aletta Edouard, MD [facility] INTERV RAD   IR GENERIC HISTORICAL  11/09/2016   IR RADIOLOGIST EVAL & MGMT 11/09/2016 Aletta Edouard, MD [facility] INTERV RAD   IR RADIOLOGIST EVAL & MGMT  03/01/2017   IR RADIOLOGIST EVAL & MGMT  06/29/2017   LAPAROSCOPIC CHOLECYSTECTOMY  1999   LEFT HEART CATH AND CORONARY ANGIOGRAPHY N/A 12/16/2021   Procedure: LEFT HEART CATH AND CORONARY ANGIOGRAPHY;  Surgeon: Early Osmond, MD;  Location: [place] [facility];  Service: Cardiovascular;  Laterality: N/A;   LIVER BIOPSY  2010   RADIOACTIVE SEED IMPLANT  X 3   "to my liver"   TUMOR EXCISION  01/2009   Carcinoil Resection    TUMOR REMOVAL  from small intestine   Patient Active Problem List   Diagnosis Date Noted   Secondary malignant neoplasm of bone ([place]) 02/08/2022   Prediabetes 09/03/2021   Near syncope 04/22/2020   Iron deficiency anemia due to chronic blood loss 03/29/2019   Iron malabsorption 03/29/2019   Lumbar radiculopathy 08/16/2017   CAD (coronary artery disease) 09/21/2016   Unstable angina ([place]) 02/09/2016   Vitamin D deficiency 03/11/2015   Metastatic malignant neuroendocrine tumor to liver ([place]) 02/14/2015   Hyperglycemia 04/03/2012   Diarrhea 03/29/2012   B12 deficiency 04/19/2011   Antiplatelet or antithrombotic long-term use 03/30/2011   TOBACCO USE, QUIT 10/13/2009   Unspecified vitamin D deficiency 06/19/2009   Neoplasm by body site  12/23/2008   DIVERTICULOSIS, COLON 11/08/2008   ABDOMINAL PAIN 10/02/2008   Hypothyroidism 12/13/2007   HEMORRHOIDS, NOS 12/13/2007   Belching 12/13/2007   COLONIC POLYPS, HX OF 12/13/2007   Essential hypertension 09/15/2007   Gastroesophageal reflux disease 09/15/2007   PANCREATITIS, CHRONIC 09/15/2007   CARDIAC MURMUR 09/15/2007   SNORING 09/15/2007       REFERRING DIAG: S39.012A (ICD-10-CM) - Back strain, initial encounter S46.912A (ICD-10-CM) - Muscle strain of left upper arm, initial encounter   THERAPY DIAG:  Acute pain of left shoulder   Muscle weakness (generalized)   Shoulder blade pain   Rationale for Evaluation and Treatment: Rehabilitation   ONSET DATE: 3-4 weeks   SUBJECTIVE:                                                                                                                                                                                       SUBJECTIVE STATEMENT: 11/02/2022 States he feels pretty good and he thinks his neck stretcher is helping (inflatable traction device which he brought in today.) States that he has not had pain in his shoulder blade but still has some tingling in his arm.   Eval: Patient's wife is bed bound (weighs 220 pounds) and when she has a bowel movement she has to physically hold her up with her left hand while he helps her with her right. States this has been going on for 4 months. States she thought he was getting better but when he was at the dentist it felt like it irritated the spot on the back of the shoulder blade. States that sometimes it hurts by the shoulder blade, sometimes in the left arm. Pain is worse at night when he lays on his right side. States that ice helps. States sometimes just reaching out with his left arm bothers him.    She is going into a hospice home for 5 days to give him some rest in  his shoulder.    PERTINENT HISTORY: heart attack, HTN, Metastatic cancer (being treated and monitored), cardiac  catherization   PAIN:  Are you having pain? Yes: NPRS scale: 0/10 Pain location: neck left side and into left arm Pain description: slight discomfort  Aggravating factors: using computer,  Relieving factors: ice, rest, repositioning   PRECAUTIONS: None   WEIGHT BEARING RESTRICTIONS: No   FALLS:  Has patient fallen in last 6 months? 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      OBJECTIVE:    DIAGNOSTIC FINDINGS:  Thoracic spine MRI 05/06/22 IMPRESSION: 1. Stable small enhancing metastatic lesions involving T6 and T7. The T12 lesions appear improved without definite contrast enhancement on today's study. 2. Stable T5 hemangioma. 3. No new metastatic lesions are identified. 4. No significant thoracic disc protrusions or spinal stenosis.   Spinal  MRI 08/13/22 IMPRESSION: 1. No cervical spine metastatic disease identified.   2. Continued stable MRI appearance of the thoracic spine, with unchanged small treated T6 and T12 metastasis, and stable suspected small T7 body metastasis (T5 benign hemangioma).   3. Two indeterminate lumbar vertebral lesions at L3 (favor a benign hemangioma given CT and MRI appearance) and L4 (indeterminate). Repeat Lumbar MRI can be utilized for surveillance.   4. Generally age-appropriate underlying spinal degeneration. No significant spinal stenosis.         COGNITION: Overall cognitive status: Within functional limits for tasks assessed                                  SENSATION: Not tested   POSTURE: Forward head, rounded shoulders, increased thoracic kyphosis, and sacral sitting. Right shoulder lower then left.             Cervical  AROM:    11/02/2021      Flexion WFL     Extension  25     R ROT 50      L ROT 40     R SB      L SB            (Blank rows = not  tested)              UE Measurements       Upper Extremity Right 10/28/2022 Left 10/28/2022    A/PROM MMT A/PROM MMT  Shoulder Flexion 160 4+ 160 4+  Shoulder Extension          Shoulder Abduction WFL (scaption) 4 WFL (scaption)*   Shoulder Adduction          Shoulder Internal Rotation Reaches to opposite L3 SP 4+ Reaches to opposite T12SP 4+  Shoulder External Rotation Reaches to top of ipsilateral shoulder blade 4+ Reaches to top of ipsilateral shoulder blade 4+  Elbow Flexion          Elbow Extension          Wrist Flexion          Wrist Extension          Wrist Supination          Wrist Pronation          Wrist Ulnar Deviation          Wrist Radial Deviation  Grip Strength NA   NA                          (Blank rows = not tested)                       * pain Right handed      TODAY'S TREATMENT:                                                                                                                                         DATE:  11/02/2022 Therapeutic Exercise:    Aerobic: Supine:   Prone:     standing: CALF STRETCH X3 30" HOLDS, B DF AT WALL 5 MINUTES B    seated: self cervical traction with own unit 5 minutes. :marches on pillows focus on pillows Neuromuscular Re-education:   Manual Therapy:   Self Care: Trigger Point Dry Needling:  Modalities:      PATIENT EDUCATION:  Education details: on HEP  and progress made,, ANSWERED  all questions Person educated: Patient Education method: Explanation, Demonstration, and Handouts Education comprehension: verbalized understanding'   HOME EXERCISE PROGRAM: [id number]   ASSESSMENT:   CLINICAL IMPRESSION: 11/02/2022 Session focused on review of HEP and progression of exercises. Printed off exercises and reviewed all for HEP adherence. Patient to DC to HEP secondary to progress made    Eval: Patient presents with left shoulder blade and shoulder pain that are exacerbated and improved with cervical  positioning and postures. Patient presents with pain, posture deficits, and ROM restrictions that are limiting overall function at home while taking care of his wife and his sleep quality. Patient would greatly benefit from skilled PT to return patient to optimal QOL.   OBJECTIVE IMPAIRMENTS: decreased activity tolerance, decreased mobility, difficulty walking, decreased ROM, decreased strength, impaired UE functional use, improper body mechanics, postural dysfunction, and pain.    ACTIVITY LIMITATIONS: lifting, sleeping, reach over head, locomotion level, and caring for others   PARTICIPATION LIMITATIONS: interpersonal relationship and occupation   PERSONAL FACTORS: Age, Fitness, and Past/current experiences are also affecting patient's functional outcome.    REHAB POTENTIAL: Good   CLINICAL DECISION MAKING: Stable/uncomplicated   EVALUATION COMPLEXITY: Low     GOALS: Goals reviewed with patient? yes   SHORT TERM GOALS: Target date: 11/02/2022  Patient will be independent in self management strategies to improve quality of life and functional outcomes. Baseline: New Program Goal status: MET   2.  Patient will report at least 50% improvement in overall symptoms and/or function to demonstrate improved functional mobility Baseline: 0% better Goal status: MET   3.  Patient will be able to demonstrate painfree cervical ROM  Baseline: painful Goal status: MET   4.  Patient will report improved sleeping with pillow/support to reduce stress on neck at night.  Baseline:  Goal status: MET  LONG TERM GOALS: Target date: 11/30/2022    Patient will report at least 75% improvement in overall symptoms and/or function to demonstrate improved functional mobility Baseline: 0% better Goal status: MET   2.  Patient will be able to demonstrate at least 50 degrees of cervical rotation in both directions Baseline:  Goal status: PROGRESSING   3.  Patient will be able to demonstrate at  least 4/5 MMT in B shoulders to improve shoulder strength. Baseline:  Goal status: MET         PLAN:   PT FREQUENCY: 2x/week   PT DURATION: 8 weeks   PLANNED INTERVENTIONS: Therapeutic exercises, Therapeutic activity, Neuromuscular re-education, Balance training, Gait training, Patient/Family education, Self Care, Joint mobilization, Joint manipulation, Vestibular training, Canalith repositioning, Orthotic/Fit training, DME instructions, Aquatic Therapy, Cognitive remediation, Electrical stimulation, Spinal manipulation, Spinal mobilization, Cryotherapy, Moist heat, Traction, Ultrasound, Ionotophoresis 81m/ml Dexamethasone, Manual therapy, and Re-evaluation   PLAN FOR NEXT SESSION: neck motions, posture, STM, traction, thoracic mobility, shoulder mobility and strengthening     12:53 PM, 11/02/22 MJerene Pitch DPT Physical Therapy with CRoyston Sinner

## 2022-11-04 ENCOUNTER — Encounter: Payer: Medicare HMO | Admitting: Physical Therapy

## 2022-11-14 ENCOUNTER — Other Ambulatory Visit: Payer: Self-pay | Admitting: Family Medicine

## 2022-11-14 DIAGNOSIS — E039 Hypothyroidism, unspecified: Secondary | ICD-10-CM

## 2022-11-16 ENCOUNTER — Encounter: Payer: Self-pay | Admitting: Medical Oncology

## 2022-11-16 ENCOUNTER — Inpatient Hospital Stay: Payer: Medicare HMO

## 2022-11-16 ENCOUNTER — Inpatient Hospital Stay: Payer: Medicare HMO | Admitting: Medical Oncology

## 2022-11-16 ENCOUNTER — Inpatient Hospital Stay: Payer: Medicare HMO | Attending: Hematology & Oncology

## 2022-11-16 VITALS — BP 132/70 | HR 63 | Temp 97.9°F | Resp 17 | Wt 182.0 lb

## 2022-11-16 DIAGNOSIS — C7B8 Other secondary neuroendocrine tumors: Secondary | ICD-10-CM

## 2022-11-16 DIAGNOSIS — C7951 Secondary malignant neoplasm of bone: Secondary | ICD-10-CM

## 2022-11-16 DIAGNOSIS — D500 Iron deficiency anemia secondary to blood loss (chronic): Secondary | ICD-10-CM

## 2022-11-16 DIAGNOSIS — R590 Localized enlarged lymph nodes: Secondary | ICD-10-CM

## 2022-11-16 DIAGNOSIS — C7A8 Other malignant neuroendocrine tumors: Secondary | ICD-10-CM | POA: Insufficient documentation

## 2022-11-16 DIAGNOSIS — C801 Malignant (primary) neoplasm, unspecified: Secondary | ICD-10-CM

## 2022-11-16 LAB — CMP (CANCER CENTER ONLY)
ALT: 9 U/L (ref 0–44)
AST: 16 U/L (ref 15–41)
Albumin: 3.8 g/dL (ref 3.5–5.0)
Alkaline Phosphatase: 44 U/L (ref 38–126)
Anion gap: 5 (ref 5–15)
BUN: 16 mg/dL (ref 8–23)
CO2: 33 mmol/L — ABNORMAL HIGH (ref 22–32)
Calcium: 9.6 mg/dL (ref 8.9–10.3)
Chloride: 103 mmol/L (ref 98–111)
Creatinine: 0.98 mg/dL (ref 0.61–1.24)
GFR, Estimated: >60 mL/min (ref >60)
Glucose, Bld: 92 mg/dL (ref 70–99)
Potassium: 4.6 mmol/L (ref 3.5–5.1)
Sodium: 141 mmol/L (ref 135–145)
Total Bilirubin: 0.7 mg/dL (ref 0.3–1.2)
Total Protein: 6.9 g/dL (ref 6.5–8.1)

## 2022-11-16 LAB — CBC WITH DIFFERENTIAL (CANCER CENTER ONLY)
Abs Immature Granulocytes: 0.02 10*3/uL (ref 0.00–0.07)
Basophils Absolute: 0.1 10*3/uL (ref 0.0–0.1)
Basophils Relative: 1 %
Eosinophils Absolute: 0.4 10*3/uL (ref 0.0–0.5)
Eosinophils Relative: 6 %
HCT: 36 % — ABNORMAL LOW (ref 39.0–52.0)
Hemoglobin: 11.4 g/dL — ABNORMAL LOW (ref 13.0–17.0)
Immature Granulocytes: 0 %
Lymphocytes Relative: 13 %
Lymphs Abs: 0.8 10*3/uL (ref 0.7–4.0)
MCH: 29.9 pg (ref 26.0–34.0)
MCHC: 31.7 g/dL (ref 30.0–36.0)
MCV: 94.5 fL (ref 80.0–100.0)
Monocytes Absolute: 0.5 10*3/uL (ref 0.1–1.0)
Monocytes Relative: 8 %
Neutro Abs: 4.6 10*3/uL (ref 1.7–7.7)
Neutrophils Relative %: 72 %
Platelet Count: 127 10*3/uL — ABNORMAL LOW (ref 150–400)
RBC: 3.81 MIL/uL — ABNORMAL LOW (ref 4.22–5.81)
RDW: 14.6 % (ref 11.5–15.5)
WBC Count: 6.4 10*3/uL (ref 4.0–10.5)
nRBC: 0 % (ref 0.0–0.2)

## 2022-11-16 LAB — IRON AND IRON BINDING CAPACITY (CC-WL,HP ONLY)
Iron: 53 ug/dL (ref 45–182)
Saturation Ratios: 18 % (ref 17.9–39.5)
TIBC: 290 ug/dL (ref 250–450)
UIBC: 237 ug/dL (ref 117–376)

## 2022-11-16 LAB — FERRITIN: Ferritin: 80 ng/mL (ref 24–336)

## 2022-11-16 LAB — LACTATE DEHYDROGENASE: LDH: 167 U/L (ref 98–192)

## 2022-11-16 MED ORDER — LANREOTIDE ACETATE 120 MG/0.5ML ~~LOC~~ SOLN
120.0000 mg | Freq: Once | SUBCUTANEOUS | Status: AC
  Administered 2022-11-16: 120 mg via SUBCUTANEOUS
  Filled 2022-11-16: qty 120

## 2022-11-16 NOTE — Progress Notes (Signed)
Hematology and Oncology Follow Up Visit  Brian Mays [id number] Nov 23, 1945 77 y.o. 11/16/2022   Principle Diagnosis:  S/p yttrium-90 intrahepatic therapy - November 2016 Lutathera (Lu 177) injection on 02/01/2018 - s/p cycle 4 SBRT to T6 lesion -- 11/06/2021   Current Therapy:        Somatuline 120 mg monthly IV Iron as indicated  -- Injectafer given on 01/2021   Interim History:  Brian Mays is here today for follow-up and treatment. He reports that overall he is doing ok. He continues to care for his wife who is in hospice- she has gone downhill over the past few days and he thinks that her cancer will take her soon.This is stressful both he and his daughter.   Chromogranin A last month was up at 191.9. This has fluctuated between 110s-180s in the past. Today's result is pending. Has follow up MRI of lumbar and thoracic spine tomorrow. No back pain noted today. No neuro changes or leg weakness.   He notes fatigue at times- stable. He tries to get out and walk if weather is good.   No fever, chills, n/v, cough, rash, dizziness, SOB, chest pain, palpitations, abdominal pain or changes in bowel or bladder habits.  No swelling or tenderness in his extremities.  He has some numbness and tingling in the left arm secondary to a pinched nerve in the neck. He is currently in PT for this which seems to be helping.  No falls or syncope reported.  Appetite comes and goes. He is doing his best to stay well hydrated.  Wt Readings from Last 3 Encounters:  11/16/22 182 lb (82.6 kg)  10/18/22 179 lb 12.8 oz (81.6 kg)  09/30/22 181 lb 12.8 oz (82.5 kg)     ECOG Performance Status: 1 - Symptomatic but completely ambulatory  Medications:  Allergies as of 11/16/2022       Reactions   Iohexol Hives    Code: HIVES, Desc: PER [name] @ PRIMARY CARE, PT IS ALLERGIC TO CONTRAST DYE 10/02/08/RM  05/01/10...needs full premeds per our protocol w/ [facility]., Onset Date: [id number]   Iodinated Contrast Media  Hives        Medication List        Accurate as of November 16, 2022 10:33 AM. If you have any questions, ask your nurse or doctor.          aspirin EC 81 MG tablet Take 81 mg by mouth every morning.   atorvastatin 80 MG tablet Commonly known as: LIPITOR Take 1 tablet (80 mg total) by mouth daily.   Cholecalciferol 25 MCG (1000 UT) tablet Take 1,000 Units by mouth 2 (two) times daily.   clopidogrel 75 MG tablet Commonly known as: PLAVIX TAKE 1 TABLET BY MOUTH EVERY DAY   cyanocobalamin 1000 MCG tablet Commonly known as: VITAMIN B12 Take 1,000 mcg by mouth every other day.   diphenhydramine-acetaminophen 25-500 MG Tabs tablet Commonly known as: TYLENOL PM Take 1 tablet by mouth at bedtime as needed (sleep/pain).   fluticasone 50 MCG/ACT nasal spray Commonly known as: FLONASE Place 2 sprays into both nostrils daily as needed (sinuses).   hydrocortisone 25 MG suppository Commonly known as: ANUSOL-HC INSERT 1 SUPPOSITORY RECTALLY TWICE A DAY AS NEEDED FOR HEMORRHOIDS   ipratropium 0.03 % nasal spray Commonly known as: Atrovent Place 2 sprays into the nose 3 (three) times daily. Use as needed for nasal drip   lansoprazole 30 MG capsule Commonly known as: PREVACID TAKE 1 CAPSULE (30  MG TOTAL) BY MOUTH DAILY AT 12 NOON.   levothyroxine 50 MCG tablet Commonly known as: SYNTHROID TAKE 1 TABLET BY MOUTH EVERY DAY BEFORE BREAKFAST   losartan 50 MG tablet Commonly known as: COZAAR Take 1 tablet (50 mg total) by mouth daily.   metoprolol tartrate 25 MG tablet Commonly known as: LOPRESSOR Take 0.5 tablets (12.5 mg total) by mouth 2 (two) times daily.   nitroGLYCERIN 0.4 MG SL tablet Commonly known as: NITROSTAT PLACE 1 TABLET UNDER THE TONGUE EVERY 5 (FIVE) MINUTES X 3 DOSES AS NEEDED FOR CHEST PAIN.   ranolazine 500 MG 12 hr tablet Commonly known as: RANEXA Take 1 tablet (500 mg total) by mouth 2 (two) times daily.   SOMATULINE DEPOT [place] Inject 120 mcg into  the skin every 28 (twenty-eight) days. Receives at Dr Antonieta Pert office   SOOTHE XP OP Place 1 drop into both eyes daily as needed (dry eyes).        Allergies:  Allergies  Allergen Reactions   Iohexol Hives     Code: HIVES, Desc: PER [name] @ [facility]IMARY [facility]ARE, PT IS ALLERGIC TO CONTRAST DYE 10/02/08/RM  05/01/10...needs full premeds per our protocol w/ [facility]., Onset Date: [id number]    Iodinated Contrast Media Hives    Past Medical History, Surgical history, Social history, and Family History were reviewed and updated.  Review of Systems: All other 10 point review of systems is negative.   Physical Exam:  weight is 182 lb (82.6 kg). His oral temperature is 97.9 F (36.6 C). His blood pressure is 132/70 and his pulse is 63. His respiration is 17 and oxygen saturation is 100%.   Wt Readings from Last 3 Encounters:  11/16/22 182 lb (82.6 kg)  10/18/22 179 lb 12.8 oz (81.6 kg)  09/30/22 181 lb 12.8 oz (82.5 kg)    Ocular: Sclerae unicteric, pupils equal, round and reactive to light Ear-nose-throat: Oropharynx clear, dentition fair Lymphatic: right posterior cervical lymph firm enlarged lymph nodes roughly small egg sized. Lungs no rales or rhonchi, good excursion bilaterally Heart regular rate and rhythm, no murmur appreciated Abd soft, nontender, positive bowel sounds MSK no focal spinal tenderness, no joint edema Neuro: non-focal, well-oriented, appropriate affect Breasts: Deferred   Lab Results  Component Value Date   WBC 6.4 11/16/2022   HGB 11.4 (L) 11/16/2022   HCT 36.0 (L) 11/16/2022   MCV 94.5 11/16/2022   PLT 127 (L) 11/16/2022   Lab Results  Component Value Date   FERRITIN 113 06/14/2022   IRON 60 06/14/2022   TIBC 280 06/14/2022   UIBC 220 06/14/2022   IRONPCTSAT 21 06/14/2022   Lab Results  Component Value Date   RETICCTPCT 1.2 06/14/2022   RBC 3.81 (L) 11/16/2022   No results found for: "KPAFRELGTCHN", "LAMBDASER", "KAPLAMBRATIO" No results found  for: "IGGSERUM", "IGA", "IGMSERUM" No results found for: "TOTALPROTELP", "ALBUMINELP", "A1GS", "A2GS", "BETS", "BETA2SER", "GAMS", "MSPIKE", "SPEI"   Chemistry      Component Value Date/Time   NA 141 10/18/2022 1314   NA 141 06/22/2022 0942   NA 144 10/04/2017 0904   NA 141 07/23/2016 1255   K 4.6 10/18/2022 1314   K 3.8 10/04/2017 0904   K 4.1 07/23/2016 1255   CL 104 10/18/2022 1314   CL 103 10/04/2017 0904   CO2 31 10/18/2022 1314   CO2 28 10/04/2017 0904   CO2 27 07/23/2016 1255   BUN 20 10/18/2022 1314   BUN 20 06/22/2022 0942   BUN 14 10/04/2017 0904  BUN 13.8 07/23/2016 1255   CREATININE 1.12 10/18/2022 1314   CREATININE 0.8 10/04/2017 0904   CREATININE 1.0 07/23/2016 1255      Component Value Date/Time   CALCIUM 9.0 10/18/2022 1314   CALCIUM 9.4 10/04/2017 0904   CALCIUM 9.2 07/23/2016 1255   ALKPHOS 43 10/18/2022 1314   ALKPHOS 64 10/04/2017 0904   ALKPHOS 77 07/23/2016 1255   AST 19 10/18/2022 1314   AST 23 07/23/2016 1255   ALT 14 10/18/2022 1314   ALT 22 10/04/2017 0904   ALT 13 07/23/2016 1255   BILITOT 0.9 10/18/2022 1314   BILITOT 1.13 07/23/2016 1255      Encounter Diagnoses  Name Primary?   Metastatic malignant neuroendocrine tumor to liver [facility]) Yes   Malignant neoplasm metastatic to thoracic vertebral column with unknown primary site [facility])    Posterior cervical lymphadenopathy     Impression and Plan: Mr. Zalesky is a very pleasant 77 yo caucasian gentleman with metastatic low-grade neuroendocrine carcinoma with hepatic metastasis.   Chromogranin A pending- suspect it may be elevated further New lymph node of concern. PET scan ordered MRI spine tomorrow  Somatuline given today Follow-up in 1 month.   Hughie Closs, PA-C 1/16/202410:33 AM

## 2022-11-16 NOTE — Patient Instructions (Signed)
Lanreotide Injection What is this medication? LANREOTIDE (lan REE oh tide) treats high levels of growth hormone (acromegaly). It is used when other therapies have not worked well enough or cannot be tolerated. It works by reducing the amount of growth hormone your body makes. This reduces symptoms and the risk of health problems caused by too much growth hormone, such as diabetes and heart disease. It may also be used to treat neuroendocrine tumors, a cancer of the cells that release hormones and other substances in your body. It works by slowing down the release of these substances from the cells. This slows tumor growth. It also decreases the symptoms of carcinoid syndrome, such as flushing or diarrhea. This medicine may be used for other purposes; ask your health care provider or pharmacist if you have questions. COMMON BRAND NAME(S): Somatuline [place] What should I tell my care team before I take this medication? They need to know if you have any of these conditions: Diabetes Gallbladder disease Heart disease Kidney disease Liver disease Thyroid disease An unusual or allergic reaction to lanreotide, other medications, foods, dyes, or preservatives Pregnant or trying to get pregnant Breast-feeding How should I use this medication? This medication is injected under the skin. It is given by your care team in a hospital or clinic setting. Talk to your care team about the use of this medication in children. Special care may be needed. Overdosage: If you think you have taken too much of this medicine contact a poison control center or emergency room at once. NOTE: This medicine is only for you. Do not share this medicine with others. What if I miss a dose? Keep appointments for follow-up doses. It is important not to miss your dose. Call your care team if you are unable to keep an appointment. What may interact with this medication? Bromocriptine Cyclosporine Certain medications for blood  pressure, heart disease, irregular heartbeat Certain medications for diabetes Quinidine Terfenadine This list may not describe all possible interactions. Give your health care provider a list of all the medicines, herbs, non-prescription drugs, or dietary supplements you use. Also tell them if you smoke, drink alcohol, or use illegal drugs. Some items may interact with your medicine. What should I watch for while using this medication? Visit your care team for regular checks on your progress. Tell your care team if your symptoms do not start to get better or if they get worse. Your condition will be monitored carefully while you are receiving this medication. You may need blood work while you are taking this medication. This medication may increase blood sugar. The risk may be higher in patients who already have diabetes. Ask your care team what you can do to lower your risk of diabetes while taking this medication. Talk to your care team if you wish to become pregnant or think you may be pregnant. This medication can cause serious birth defects. Do not breast-feed while taking this medication and for 6 months after stopping therapy. This medication may cause infertility. Talk to your care team if you are concerned about your fertility. What side effects may I notice from receiving this medication? Side effects that you should report to your care team as soon as possible: Allergic reactions--skin rash, itching, hives, swelling of the face, lips, tongue, or throat Gallbladder problems--severe stomach pain, nausea, vomiting, fever High blood sugar (hyperglycemia)--increased thirst or amount of urine, unusual weakness or fatigue, blurry vision Increase in blood pressure Low blood sugar (hypoglycemia)--tremors or shaking, anxiety, sweating, cold   or clammy skin, confusion, dizziness, rapid heartbeat Low thyroid levels (hypothyroidism)--unusual weakness or fatigue, increased sensitivity to cold,  constipation, hair loss, dry skin, weight gain, feelings of depression Slow heartbeat--dizziness, feeling faint or lightheaded, confusion, trouble breathing, unusual weakness or fatigue Side effects that usually do not require medical attention (report to your care team if they continue or are bothersome): Diarrhea Dizziness Headache Muscle spasms Nausea Pain, redness, irritation, or bruising at the injection site Stomach pain This list may not describe all possible side effects. Call your doctor for medical advice about side effects. You may report side effects to FDA at 1-800-FDA-1088. Where should I keep my medication? This medication is given in a hospital or clinic. It will not be stored at home. NOTE: This sheet is a summary. It may not cover all possible information. If you have questions about this medicine, talk to your doctor, pharmacist, or health care provider.  2023 Elsevier/Gold Standard (2021-12-18 00:00:00)  

## 2022-11-17 ENCOUNTER — Ambulatory Visit (HOSPITAL_COMMUNITY)
Admission: RE | Admit: 2022-11-17 | Discharge: 2022-11-17 | Disposition: A | Discharge status: Home / Self Care | Payer: Medicare HMO | Source: Ambulatory Visit | Attending: Radiation Oncology | Admitting: Radiation Oncology

## 2022-11-17 ENCOUNTER — Encounter (HOSPITAL_COMMUNITY): Payer: Self-pay

## 2022-11-17 DIAGNOSIS — C7A8 Other malignant neuroendocrine tumors: Secondary | ICD-10-CM | POA: Diagnosis not present

## 2022-11-17 DIAGNOSIS — C7951 Secondary malignant neoplasm of bone: Secondary | ICD-10-CM

## 2022-11-17 MED ORDER — GADOBUTROL 1 MMOL/ML IV SOLN
8.0000 mL | Freq: Once | INTRAVENOUS | Status: AC | PRN
  Administered 2022-11-17: 8 mL via INTRAVENOUS

## 2022-11-18 LAB — CHROMOGRANIN A: Chromogranin A (ng/mL): 175.5 ng/mL — ABNORMAL HIGH (ref 0.0–101.8)

## 2022-11-22 ENCOUNTER — Other Ambulatory Visit: Payer: Self-pay | Admitting: Radiation Therapy

## 2022-11-22 ENCOUNTER — Inpatient Hospital Stay: Payer: Medicare HMO

## 2022-11-22 ENCOUNTER — Encounter: Payer: Self-pay | Admitting: Radiation Oncology

## 2022-11-22 ENCOUNTER — Ambulatory Visit
Admission: RE | Admit: 2022-11-22 | Discharge: 2022-11-22 | Disposition: A | Discharge status: Home / Self Care | Payer: Medicare HMO | Source: Ambulatory Visit | Attending: Radiation Oncology | Admitting: Radiation Oncology

## 2022-11-22 ENCOUNTER — Telehealth: Payer: Self-pay | Admitting: Radiation Therapy

## 2022-11-22 DIAGNOSIS — C7951 Secondary malignant neoplasm of bone: Secondary | ICD-10-CM | POA: Diagnosis not present

## 2022-11-22 DIAGNOSIS — C7B8 Other secondary neuroendocrine tumors: Secondary | ICD-10-CM

## 2022-11-22 DIAGNOSIS — C7A1 Malignant poorly differentiated neuroendocrine tumors: Secondary | ICD-10-CM | POA: Diagnosis not present

## 2022-11-22 NOTE — Progress Notes (Signed)
Telephone nursing interview for patient to receive most recent MRI results from 11/17/22.  I verified patient's identity and began nursing interview. Patient reports doing well. No complaints at this time.  Meaningful use complete.  Patient aware of his 1:30pm-11/22/2052 telephone appointment w/ Shona Simpson PA-C. I left my extension [phone] in case patient needs anything. Patient verbalized understanding.  This concludes the interview.   Leandra Kern, LPN

## 2022-11-22 NOTE — Telephone Encounter (Signed)
Called Brian Mays to let him know we have already set up his May follow-up T spine MRI and that [name] will call him with those results on 5/20. He has an active MyChart account and is aware of the details.   Mont Dutton R.T.(R)(T) Radiation Special Procedures Navigator

## 2022-11-22 NOTE — Progress Notes (Signed)
Radiation Oncology         [phone] ________________________________  Outpatient Follow Up - Conducted via telephone at patient request.  I spoke with the patient to conduct this visit via telephone. The patient was notified in advance and was offered an in person or telemedicine meeting to allow for face to face communication but instead preferred to proceed with a telephone visit.   Name: Brian Mays        MRN: [id number]  Date of Service: 11/22/2022 DOB: Jul 01, 1946  [id number], Brian Filler, MD  Copland, Brian Filler, MD     REFERRING PHYSICIAN: Copland, Brian Filler, MD   DIAGNOSIS: The primary encounter diagnosis was Metastatic malignant neuroendocrine tumor to liver [facility]). A diagnosis of Secondary malignant neoplasm of bone [facility]) was also pertinent to this visit.   HISTORY OF PRESENT ILLNESS: Brian Mays is a 77 y.o. male s with a history of neuroendocrine tumor in 2014, originally in the small bowel.  He has had known metastatic disease and has received Y 90 intrahepatic therapy in November 2016, Lutathera injection x4 cycles in 2019 and has remained on Somatuline under the care of Brian Mays.  PET in October 2022 showed a lesion in T6 vertebral body concerning for further neuroendocrine tumor disease stable mild uptake in the T12 vertebral body was also appreciated and hepatic metastases and central mesenteric mass were also noted but stable.  An MRI of the thoracic spine on 09/14/2021 showed enhancing lesions at T6 and T12 consistent with metastatic disease but without pathologic fracture or compromise to his spinal canal or cord.  He went on to receive stereotactic radiosurgery to T6 and T12 and single fraction style therapy which he completed in January 2023.  His first posttreatment MRI of the spine on 02/04/2022 showed posttreatment effect and T6 and T12 but a 4 mm enhancing lesion in the T7 vertebral body that was felt to be new since prior imaging.  He had a PET scan in April  2023 and there were no findings in the T7 location.    In some additional previous scans he had findings consistent with hemangiomatous changes at T5 and L3-4.  He also has degenerative changes in his cervical spine that has been seen on complete imaging of the spine in the past. Given the questionable findings in the L3/4 region in October 2023 scans, a repeat lumbar MRI was performed at the same time as his thoracic MRI on 11/17/2022.  No new or progressive disease was noted in the thoracic or lumbar spine.  Hemangiomatous changes were noted at T5, marrow changes at T6 consistent with previously treated disease, and stable enhancement in the T7 vertebral body and again marrow changes consistent with prior treatment to T12 are also noted.  In the lumbar spine benign hemangiomatous changes are noted in the L3 vertebral body, and stable 8 mm focus of marrow space change in the L4 vertebral body and this is felt to be stable overall in comparison when discussed in brain and spine oncology conference this morning. Of note the patient continues Lanreotide injections with Brian Mays.   PREVIOUS RADIATION THERAPY:     11/06/2021 through 11/06/2021 [facility] Spine Site Technique Total Dose (Gy) Dose per Fx (Gy) Completed Fx Beam Energies  Thoracic Spine: Spine_T6 IMRT 18/18 18 1/1 6XFFF  Thoracic Spine: Spine_T12         Lutathera (Lu 177) injection on 02/01/2018 - s/p cycle 4  PAST MEDICAL HISTORY:  Past Medical History:  Diagnosis Date   Anemia    in past   Cataract    Colon polyps    Diverticulosis    Gallstones    GERD (gastroesophageal reflux disease)    Heart attack ([place])    mild, Spring 2017   Heart murmur    Hemorrhoid    Hiatal hernia    HTN (hypertension)    Hyperlipidemia    Hypothyroidism    Iron deficiency anemia due to chronic blood loss 03/29/2019   Iron malabsorption 03/29/2019   Metastatic carcinoma ([place]) 2010   Brian Jonette Eva   Neuroendocrine cancer [facility]) 08/2021   Pancreatitis 1998    chronic       PAST SURGICAL HISTORY: Past Surgical History:  Procedure Laterality Date   APPENDECTOMY  1962   CARDIAC CATHETERIZATION N/A 02/10/2016   Procedure: Left Heart Cath and Coronary Angiography;  Surgeon: Adrian Prows, MD;  Location: [place] [facility];  Service: Cardiovascular;  Laterality: N/A;   CARDIAC CATHETERIZATION  02/10/2016   Procedure: Coronary/Graft Atherectomy;  Surgeon: Adrian Prows, MD;  Location: [place] [facility];  Service: Cardiovascular;;   CARDIAC CATHETERIZATION  02/10/2016   Procedure: Coronary Stent Intervention;  Surgeon: Adrian Prows, MD;  Location: [place] [facility];  Service: Cardiovascular;;   CATARACT EXTRACTION W/ INTRAOCULAR LENS  IMPLANT, BILATERAL Bilateral    CORONARY STENT INTERVENTION N/A 12/16/2021   Procedure: CORONARY STENT INTERVENTION;  Surgeon: Early Osmond, MD;  Location: [place] [facility];  Service: Cardiovascular;  Laterality: N/A;   IR GENERIC HISTORICAL  12/30/2015   IR RADIOLOGIST EVAL & MGMT 12/30/2015 Aletta Edouard, MD [facility] INTERV RAD   IR GENERIC HISTORICAL  11/09/2016   IR RADIOLOGIST EVAL & MGMT 11/09/2016 Aletta Edouard, MD [facility] INTERV RAD   IR RADIOLOGIST EVAL & MGMT  03/01/2017   IR RADIOLOGIST EVAL & MGMT  06/29/2017   LAPAROSCOPIC CHOLECYSTECTOMY  1999   LEFT HEART CATH AND CORONARY ANGIOGRAPHY N/A 12/16/2021   Procedure: LEFT HEART CATH AND CORONARY ANGIOGRAPHY;  Surgeon: Early Osmond, MD;  Location: [place] [facility];  Service: Cardiovascular;  Laterality: N/A;   LIVER BIOPSY  2010   RADIOACTIVE SEED IMPLANT  X 3   "to my liver"   TUMOR EXCISION  01/2009   Carcinoil Resection    TUMOR REMOVAL     from small intestine     FAMILY HISTORY:  Family History  Problem Relation Age of Onset   Kidney disease Mother    Hyperlipidemia Mother    Hypertension Mother    COPD Father    Ulcerative colitis Daughter    Colon cancer Neg Hx    Esophageal cancer Neg Hx    Rectal cancer Neg Hx    Stomach cancer Neg Hx       SOCIAL HISTORY:  reports that he quit smoking about 24 years ago. His smoking use included cigarettes. He started smoking about 63 years ago. He has a 52.50 pack-year smoking history. He has never used smokeless tobacco. He reports current alcohol use of about 3.0 - 4.0 standard drinks of alcohol per week. He reports that he does not use drugs. The patient is married and lives in [place]. His wife is also a patient at the cancer center with Brian Mays and Brian Mays, and remains in hospice care.    ALLERGIES: Iohexol and Iodinated contrast media   MEDICATIONS:  Current Outpatient Medications  Medication Sig Dispense Refill   Artificial Tear Solution (SOOTHE XP OP) Place 1 drop  into both eyes daily as needed (dry eyes).     aspirin EC 81 MG tablet Take 81 mg by mouth every morning.     atorvastatin (LIPITOR) 80 MG tablet Take 1 tablet (80 mg total) by mouth daily. 90 tablet 1   Cholecalciferol 1000 UNITS tablet Take 1,000 Units by mouth 2 (two) times daily.     clopidogrel (PLAVIX) 75 MG tablet TAKE 1 TABLET BY MOUTH EVERY DAY 90 tablet 2   diphenhydramine-acetaminophen (TYLENOL PM) 25-500 MG TABS tablet Take 1 tablet by mouth at bedtime as needed (sleep/pain).     fluticasone (FLONASE) 50 MCG/ACT nasal spray Place 2 sprays into both nostrils daily as needed (sinuses).  11   hydrocortisone (ANUSOL-HC) 25 MG suppository INSERT 1 SUPPOSITORY RECTALLY TWICE A DAY AS NEEDED FOR HEMORRHOIDS 30 suppository 0   ipratropium (ATROVENT) 0.03 % nasal spray Place 2 sprays into the nose 3 (three) times daily. Use as needed for nasal drip 30 mL 6   Lanreotide Acetate (SOMATULINE DEPOT [place]) Inject 120 mcg into the skin every 28 (twenty-eight) days. Receives at Brian Mays office     lansoprazole (PREVACID) 30 MG capsule TAKE 1 CAPSULE (30 MG TOTAL) BY MOUTH DAILY AT 12 NOON. 90 capsule 3   levothyroxine (SYNTHROID) 50 MCG tablet TAKE 1 TABLET BY MOUTH EVERY DAY BEFORE BREAKFAST 90 tablet 1    losartan (COZAAR) 50 MG tablet Take 1 tablet (50 mg total) by mouth daily. 90 tablet 3   metoprolol tartrate (LOPRESSOR) 25 MG tablet Take 0.5 tablets (12.5 mg total) by mouth 2 (two) times daily. 90 tablet 3   nitroGLYCERIN (NITROSTAT) 0.4 MG SL tablet PLACE 1 TABLET UNDER THE TONGUE EVERY 5 (FIVE) MINUTES X 3 DOSES AS NEEDED FOR CHEST PAIN. 25 tablet 4   ranolazine (RANEXA) 500 MG 12 hr tablet Take 1 tablet (500 mg total) by mouth 2 (two) times daily. 180 tablet 3   vitamin B-12 (CYANOCOBALAMIN) 1000 MCG tablet Take 1,000 mcg by mouth every other day.     No current facility-administered medications for this encounter.     REVIEW OF SYSTEMS: The patient reports he is doing well. His wife has been in hospice care for about 15 months, and he states she's thinking about whether or not she should have considered treatment. He may be calling Brian Mays. He reports physcially he's doing well. He's not been walking as much this winter due to the temperature. He hopes to get back to this soon. He does have some left neck pain with lateral rotation and occasional numbness in his arm. He's been working on range of motion with physical therapy. He feels this has been stable overall for years. No other complaints are verbalized.     PHYSICAL EXAM:   Pain Assessment Pain Score: 0-No pain/10  Unable to assess given encounter type.  ECOG = 1  0 - Asymptomatic (Fully active, able to carry on all predisease activities without restriction)  1 - Symptomatic but completely ambulatory (Restricted in physically strenuous activity but ambulatory and able to carry out work of a light or sedentary nature. For example, light housework, office work)  2 - Symptomatic, <50% in bed during the day (Ambulatory and capable of all self care but unable to carry out any work activities. Up and about more than 50% of waking hours)  3 - Symptomatic, >50% in bed, but not bedbound (Capable of only limited self-care, confined  to bed or chair 50% or more of waking hours)  4 - Bedbound (Completely disabled. Cannot carry on any self-care. Totally confined to bed or chair)  5 - Death   Eustace Pen MM, Creech RH, Tormey DC, et al. [phone]). "Toxicity and response criteria of the [facility]". [place] Oncol. 5 (6): 649-55    LABORATORY DATA:  Lab Results  Component Value Date   WBC 6.4 11/16/2022   HGB 11.4 (L) 11/16/2022   HCT 36.0 (L) 11/16/2022   MCV 94.5 11/16/2022   PLT 127 (L) 11/16/2022   Lab Results  Component Value Date   NA 141 11/16/2022   K 4.6 11/16/2022   CL 103 11/16/2022   CO2 33 (H) 11/16/2022   Lab Results  Component Value Date   ALT 9 11/16/2022   AST 16 11/16/2022   ALKPHOS 44 11/16/2022   BILITOT 0.7 11/16/2022      RADIOGRAPHY: MR Lumbar Spine W Wo Contrast  Result Date: 11/18/2022 CLINICAL DATA:  Follow-up metastatic neuroendocrine carcinoma. EXAM: MRI LUMBAR SPINE WITHOUT AND WITH CONTRAST TECHNIQUE: Multiplanar and multiecho pulse sequences of the lumbar spine were obtained without and with intravenous contrast. CONTRAST:  61m GADAVIST GADOBUTROL 1 MMOL/ML IV SOLN COMPARISON:  08/13/2022 FINDINGS: Segmentation:  5 lumbar type vertebral bodies. Alignment:  Normal Vertebrae: No convincing metastatic lesion within the lumbar region. Marrow disturbance in the right-side of the L3 vertebral body felt secondary to presence of a benign hemangioma. Rounded well-circumscribed marrow space abnormality to the left of midline at the superior aspect of the L4 vertebral body is unchanged, maximal dimension 8 mm. No suspicious finding affecting L5 or the upper sacrum. Conus medullaris and cauda equina: Conus extends to the L1 level. Conus and cauda equina appear normal. Paraspinal and other soft tissues: See previous body imaging regarding known liver mass. Disc levels: No significant disc level finding at L3-4 or above. L4-5: Disc degeneration with loss of disc height. Endplate  osteophytes and bulging of the disc. Mild facet and ligamentous hypertrophy. Mild stenosis of both lateral recesses but without definite neural compression. L5-S1: Chronic disc degeneration with loss of disc height. Endplate osteophytes and minimal bulging of the disc. No compressive stenosis. IMPRESSION: 1. No definitive evidence of metastatic disease in the lumbar region. 2. Marrow disturbance in the right-side of the L3 vertebral body is felt secondary to a benign hemangioma. 3. Stable 8 mm focus of marrow space abnormality to the left of midline at the superior aspect of the L4 vertebral body. Lack of change since October argues against metastatic disease, but further follow-up is recommended. 4. Chronic degenerative changes at L4-5 and L5-S1 as outlined above. Electronically Signed   By: MNelson ChimesM.D.   On: 11/18/2022 11:46   MR THORACIC SPINE W WO CONTRAST  Result Date: 11/18/2022 CLINICAL DATA:  Metastatic disease evaluation. Following untreated T7 lesion in treated spinal disease at T6 and T12. Metastatic well differentiated neuroendocrine tumor. EXAM: MRI THORACIC WITHOUT AND WITH CONTRAST TECHNIQUE: Multiplanar and multiecho pulse sequences of the thoracic spine were obtained without and with intravenous contrast. CONTRAST:  865mGADAVIST GADOBUTROL 1 MMOL/ML IV SOLN COMPARISON:  08/13/2022.  05/06/2022.  10/29/2021.  09/14/2021. FINDINGS: Alignment:  No significant malalignment. Vertebrae: No vertebral body lesion identified from C6 through T4. T5 chronic benign hemangioma without worrisome finding. Some marrow fatty change related to regional radiation. T6 fatty marrow change consistent with previous radiation. 4 mm T2 bright enhancing lesion within the right pedicle vertebral body junction is unchanged. 9 mm enhancing lesion within the left  transverse process is unchanged. 4 mm T2 bright enhancing focus within the central portion of the T7 vertebral body has not changed or enlarged. T8 through  T11 remain normal. T12 fatty replacement related to regional radiation. Small T2 bright foci within the anterior right vertebral body and left pedicle vertebral body junction are stable since October. Cord:  No cord lesion. Paraspinal and other soft tissues: Negative Disc levels: No significant disc level pathology. Mild facet degeneration and hypertrophy at the T 11 12 level without compressive stenosis. IMPRESSION: 1. No new or progressive disease since October of this year. 2. T5 chronic benign hemangioma without worrisome finding. 3. T6 fatty marrow change consistent with previous radiation. Two stable treated lesions as above. 4. T7 4 mm T2 bright enhancing focus within the central portion of the vertebral body has not changed or enlarged since October. 5. T12 fatty marrow change related to regional radiation. Small T2 bright foci within the anterior right vertebral body and left pedicle vertebral body junction are stable since October. Electronically Signed   By: Nelson Chimes M.D.   On: 11/18/2022 11:35       IMPRESSION/PLAN: 1. Metastatic Neuroendocrine Tumor to Thoracic Spine. We discussed the patient's imaging results. He has stable post treatment changes in the thoracic spine, and stable benign hemangiomatous changes felt to be in the T3 and T4 spine. We will proceed with 4 month scans of his T spine and annual complete imaging of his entire spine. He will notify us if he has further symptoms or progressive changes in the cervical spine, andcontinue Somatuline with Brian. Arelia Sneddon and let us know if he has any other complaints or concerns.    This encounter was conducted via telephone.  The patient has provided two factor identification and has given verbal consent for this type of encounter and has been advised to only accept a meeting of this type in a secure network environment. The time spent during this encounter was 35 minutes including preparation, discussion, and coordination of the patient's  care. The attendants for this meeting include Hayden Pedro  and [name].  During the encounter,  Hayden Pedro was located at [facility] Radiation Oncology Department.  [name] was located at home.        Carola Rhine, [facility]   **Disclaimer: This note was dictated with voice recognition software. Similar sounding words can inadvertently be transcribed and this note may contain transcription errors which may not have been corrected upon publication of note.**

## 2022-12-01 ENCOUNTER — Encounter (HOSPITAL_COMMUNITY)
Admission: RE | Admit: 2022-12-01 | Discharge: 2022-12-01 | Disposition: A | Discharge status: Home / Self Care | Payer: Medicare HMO | Source: Ambulatory Visit | Attending: Medical Oncology | Admitting: Medical Oncology

## 2022-12-01 DIAGNOSIS — C7B8 Other secondary neuroendocrine tumors: Secondary | ICD-10-CM

## 2022-12-01 DIAGNOSIS — K769 Liver disease, unspecified: Secondary | ICD-10-CM | POA: Diagnosis not present

## 2022-12-01 DIAGNOSIS — C801 Malignant (primary) neoplasm, unspecified: Secondary | ICD-10-CM | POA: Diagnosis not present

## 2022-12-01 DIAGNOSIS — R590 Localized enlarged lymph nodes: Secondary | ICD-10-CM

## 2022-12-01 DIAGNOSIS — C7A1 Malignant poorly differentiated neuroendocrine tumors: Secondary | ICD-10-CM | POA: Diagnosis not present

## 2022-12-01 DIAGNOSIS — C7951 Secondary malignant neoplasm of bone: Secondary | ICD-10-CM | POA: Insufficient documentation

## 2022-12-01 DIAGNOSIS — C7A019 Malignant carcinoid tumor of the small intestine, unspecified portion: Secondary | ICD-10-CM | POA: Diagnosis not present

## 2022-12-01 DIAGNOSIS — Z8603 Personal history of neoplasm of uncertain behavior: Secondary | ICD-10-CM | POA: Diagnosis not present

## 2022-12-01 LAB — GLUCOSE, CAPILLARY: Glucose-Capillary: 98 mg/dL (ref 70–99)

## 2022-12-01 MED ORDER — FLUDEOXYGLUCOSE F - 18 (FDG) INJECTION
8.6100 | Freq: Once | INTRAVENOUS | Status: AC | PRN
  Administered 2022-12-01: 8.61 via INTRAVENOUS

## 2022-12-07 ENCOUNTER — Inpatient Hospital Stay: Payer: Medicare HMO | Attending: Hematology & Oncology | Admitting: Medical Oncology

## 2022-12-07 ENCOUNTER — Encounter: Payer: Self-pay | Admitting: Medical Oncology

## 2022-12-07 VITALS — BP 148/69 | HR 58 | Temp 97.9°F | Resp 17 | Wt 183.0 lb

## 2022-12-07 DIAGNOSIS — I6523 Occlusion and stenosis of bilateral carotid arteries: Secondary | ICD-10-CM | POA: Insufficient documentation

## 2022-12-07 DIAGNOSIS — R590 Localized enlarged lymph nodes: Secondary | ICD-10-CM | POA: Diagnosis not present

## 2022-12-07 DIAGNOSIS — D500 Iron deficiency anemia secondary to blood loss (chronic): Secondary | ICD-10-CM | POA: Insufficient documentation

## 2022-12-07 DIAGNOSIS — C7B8 Other secondary neuroendocrine tumors: Secondary | ICD-10-CM | POA: Insufficient documentation

## 2022-12-07 DIAGNOSIS — R030 Elevated blood-pressure reading, without diagnosis of hypertension: Secondary | ICD-10-CM | POA: Insufficient documentation

## 2022-12-07 DIAGNOSIS — Z79899 Other long term (current) drug therapy: Secondary | ICD-10-CM | POA: Diagnosis not present

## 2022-12-07 DIAGNOSIS — C7951 Secondary malignant neoplasm of bone: Secondary | ICD-10-CM

## 2022-12-07 DIAGNOSIS — C801 Malignant (primary) neoplasm, unspecified: Secondary | ICD-10-CM | POA: Diagnosis not present

## 2022-12-07 DIAGNOSIS — C7A8 Other malignant neuroendocrine tumors: Secondary | ICD-10-CM | POA: Diagnosis not present

## 2022-12-07 NOTE — Progress Notes (Signed)
[facility] at [facility] [address], [address] [place], [place] [address] [phone]  Date:  12/09/2022   Name:  Brian Mays   DOB:  1945-11-10   MRN:  [id number]  PCP:  Pearline Cables, MD    Chief Complaint: Foot Problem (Right foot, pt thinks they are calluses. Pt states having when pressure is applied but no pain with walking )   History of Present Illness:  Brian Mays is a 77 y.o. 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  He is getting some help from hospice and from a volunteer who will sit with her while he gets out of the house Most recent oncology note for patient from 2/6 Impression and Plan: [name] is a very pleasant 77 yo caucasian gentleman with metastatic low-grade neuroendocrine carcinoma with hepatic metastasis.   We reviewed his latest Chromogranin A lab which went down from 191.9 to 175.5. We also reviewed his recent lumbar and thoracic MR scans which were stable as well as his recent PET scan from 12/01/2022 which showed no hypermetabolic lesions. This is great news. On exam the lymph node enlargement of concern is almost fully resolved.  He has follow up next week for his monthly disease monitoring. No changes to plan at this time.   Today Jerrod notes what he suspects are tender calluses on the bottom of his right foot.  He has quite a bit of walking for exercise and these can be uncomfortable Also, he notes he has been  going to physical therapy for a presumed pinched nerve in his neck.  They have noted intermittent tingling and sometimes pain down his left arm for about 2 months.  No x-rays have been done as of yet. He does note his cervical range of motion has improved with physical therapy No weakness of the Patient Active Problem List   Diagnosis Date Noted   Secondary malignant neoplasm of bone (HCC) 02/08/2022   Prediabetes 09/03/2021   Near syncope 04/22/2020   Iron deficiency anemia due to chronic blood loss 03/29/2019   Iron malabsorption 03/29/2019   Lumbar radiculopathy 08/16/2017   CAD (coronary artery disease) 09/21/2016   Unstable angina (HCC) 02/09/2016   Vitamin D deficiency 03/11/2015   Metastatic malignant neuroendocrine tumor to liver (HCC) 02/14/2015   Hyperglycemia 04/03/2012   Diarrhea 03/29/2012   B12 deficiency 04/19/2011   Antiplatelet or antithrombotic long-term use 03/30/2011   TOBACCO USE, QUIT 10/13/2009   Unspecified vitamin D deficiency 06/19/2009   Neoplasm by body site 12/23/2008   DIVERTICULOSIS, COLON 11/08/2008   ABDOMINAL PAIN 10/02/2008   Hypothyroidism 12/13/2007   HEMORRHOIDS, NOS 12/13/2007   Belching 12/13/2007   COLONIC POLYPS, HX OF 12/13/2007   Essential hypertension 09/15/2007   Gastroesophageal reflux disease 09/15/2007   PANCREATITIS, CHRONIC 09/15/2007   CARDIAC MURMUR 09/15/2007   SNORING 09/15/2007    Past Medical History:  Diagnosis Date   Anemia    in past   Cataract  Colon polyps    Diverticulosis    Gallstones    GERD (gastroesophageal reflux disease)    Heart attack (HCC)    mild, Spring 2017   Heart murmur    Hemorrhoid    Hiatal hernia    HTN (hypertension)    Hyperlipidemia    Hypothyroidism    Iron deficiency anemia due to chronic blood loss 03/29/2019   Iron malabsorption 03/29/2019   Metastatic carcinoma (HCC) 2010   Dr Twanna Hy   Neuroendocrine cancer [facility]) 08/2021   Pancreatitis 1998   chronic    Past  Surgical History:  Procedure Laterality Date   APPENDECTOMY  1962   CARDIAC CATHETERIZATION N/A 02/10/2016   Procedure: Left Heart Cath and Coronary Angiography;  Surgeon: Yates Decamp, MD;  Location: [facility] INVASIVE CV LAB;  Service: Cardiovascular;  Laterality: N/A;   CARDIAC CATHETERIZATION  02/10/2016   Procedure: Coronary/Graft Atherectomy;  Surgeon: Yates Decamp, MD;  Location: [facility] INVASIVE CV LAB;  Service: Cardiovascular;;   CARDIAC CATHETERIZATION  02/10/2016   Procedure: Coronary Stent Intervention;  Surgeon: Yates Decamp, MD;  Location: [facility] INVASIVE CV LAB;  Service: Cardiovascular;;   CATARACT EXTRACTION W/ INTRAOCULAR LENS  IMPLANT, BILATERAL Bilateral    CORONARY STENT INTERVENTION N/A 12/16/2021   Procedure: CORONARY STENT INTERVENTION;  Surgeon: Orbie Pyo, MD;  Location: [facility] INVASIVE CV LAB;  Service: Cardiovascular;  Laterality: N/A;   IR GENERIC HISTORICAL  12/30/2015   IR RADIOLOGIST EVAL & MGMT 12/30/2015 Irish Lack, MD [facility] INTERV RAD   IR GENERIC HISTORICAL  11/09/2016   IR RADIOLOGIST EVAL & MGMT 11/09/2016 Irish Lack, MD [facility] INTERV RAD   IR RADIOLOGIST EVAL & MGMT  03/01/2017   IR RADIOLOGIST EVAL & MGMT  06/29/2017   LAPAROSCOPIC CHOLECYSTECTOMY  1999   LEFT HEART CATH AND CORONARY ANGIOGRAPHY N/A 12/16/2021   Procedure: LEFT HEART CATH AND CORONARY ANGIOGRAPHY;  Surgeon: Orbie Pyo, MD;  Location: [facility] INVASIVE CV LAB;  Service: Cardiovascular;  Laterality: N/A;   LIVER BIOPSY  2010   RADIOACTIVE SEED IMPLANT  X 3   "to my liver"   TUMOR EXCISION  01/2009   Carcinoil Resection    TUMOR REMOVAL     from small intestine    Social History   Tobacco Use   Smoking status: Former    Packs/day: 1.50    Years: 35.00    Total pack years: 52.50    Types: Cigarettes    Start date: 09/25/1959    Quit date: 12/21/1997    Years since quitting: 24.9   Smokeless tobacco: Never  Vaping Use   Vaping Use: Never used  Substance Use Topics   Alcohol use: Yes     Alcohol/week: 3.0 - 4.0 standard drinks of alcohol    Types: 3 - 4 Cans of beer per week    Comment: 4 beers weekly    Drug use: No    Family History  Problem Relation Age of Onset   Kidney disease Mother    Hyperlipidemia Mother    Hypertension Mother    COPD Father    Ulcerative colitis Daughter    Colon cancer Neg Hx    Esophageal cancer Neg Hx    Rectal cancer Neg Hx    Stomach cancer Neg Hx     Allergies  Allergen Reactions   Iohexol Hives     Code: HIVES, Desc: PER [name] @ PRIMARY CARE, PT IS ALLERGIC TO CONTRAST DYE 10/02/08/RM  05/01/10...needs full premeds per our protocol w/  [facility]., Onset Date: [id number]    Iodinated Contrast Media Hives    Medication list has been reviewed and updated.  Current Outpatient Medications on File Prior to Visit  Medication Sig Dispense Refill   Artificial Tear Solution (SOOTHE XP OP) Place 1 drop into both eyes daily as needed (dry eyes).     aspirin EC 81 MG tablet Take 81 mg by mouth every morning.     atorvastatin (LIPITOR) 80 MG tablet Take 1 tablet (80 mg total) by mouth daily. 90 tablet 1   Cholecalciferol 1000 UNITS tablet Take 1,000 Units by mouth 2 (two) times daily.     clopidogrel (PLAVIX) 75 MG tablet TAKE 1 TABLET BY MOUTH EVERY DAY 90 tablet 2   diphenhydramine-acetaminophen (TYLENOL PM) 25-500 MG TABS tablet Take 1 tablet by mouth at bedtime as needed (sleep/pain).     fluticasone (FLONASE) 50 MCG/ACT nasal spray Place 2 sprays into both nostrils daily as needed (sinuses).  11   hydrocortisone (ANUSOL-HC) 25 MG suppository INSERT 1 SUPPOSITORY RECTALLY TWICE A DAY AS NEEDED FOR HEMORRHOIDS 30 suppository 0   ipratropium (ATROVENT) 0.03 % nasal spray Place 2 sprays into the nose 3 (three) times daily. Use as needed for nasal drip 30 mL 6   Lanreotide Acetate (SOMATULINE DEPOT [place]) Inject 120 mcg into the skin every 28 (twenty-eight) days. Receives at Dr Gustavo Lah office     lansoprazole (PREVACID) 30 MG capsule TAKE 1  CAPSULE (30 MG TOTAL) BY MOUTH DAILY AT 12 NOON. 90 capsule 3   levothyroxine (SYNTHROID) 50 MCG tablet TAKE 1 TABLET BY MOUTH EVERY DAY BEFORE BREAKFAST 90 tablet 1   losartan (COZAAR) 50 MG tablet Take 1 tablet (50 mg total) by mouth daily. 90 tablet 3   metoprolol tartrate (LOPRESSOR) 25 MG tablet Take 0.5 tablets (12.5 mg total) by mouth 2 (two) times daily. 90 tablet 3   nitroGLYCERIN (NITROSTAT) 0.4 MG SL tablet PLACE 1 TABLET UNDER THE TONGUE EVERY 5 (FIVE) MINUTES X 3 DOSES AS NEEDED FOR CHEST PAIN. 25 tablet 4   ranolazine (RANEXA) 500 MG 12 hr tablet Take 1 tablet (500 mg total) by mouth 2 (two) times daily. 180 tablet 3   vitamin B-12 (CYANOCOBALAMIN) 1000 MCG tablet Take 1,000 mcg by mouth every other day.     No current facility-administered medications on file prior to visit.    Review of Systems:  As per HPI- otherwise negative.   Physical Examination: Vitals:   12/09/22 1411  BP: 138/68  Pulse: 63  Resp: 18  Temp: 97.9 F (36.6 C)  SpO2: 97%   Vitals:   12/09/22 1411  Weight: 184 lb 3.2 oz (83.6 kg)  Height: 5\' 10"  (1.778 m)   Body mass index is 26.43 kg/m. Ideal Body Weight: Weight in (lb) to have BMI = 25: 173.9  GEN: no acute distress.  Mild overweight, looks well HEENT: Atraumatic, Normocephalic.  Ears and Nose: No external deformity. CV: RRR, No M/G/R. No JVD. No thrill. No extra heart sounds. PULM: CTA B, no wheezes, crackles, rhonchi. No retractions. No resp. distress. No accessory muscle use. EXTR: No c/c/e PSYCH: Normally interactive. Conversant.  There are 3 plantar warts on the sole of the right foot, also wart on the right thumb.  Verbal consent obtained.  The above-mentioned warts are treated with cryotherapy using liquid nitrogen x 3 cycles each.  Patient tolerated well with no complications  Assessment and Plan: Paresthesia of left arm - Plan: DG Cervical Spine Complete  Plantar warts  Patient seen today with concern of plantar warts  as well as a wart on his right thumb.  Warts were treated with cryotherapy as described above He has noted paresthesias of his left arm for about 2 months, has been doing physical therapy for presumed nerve impingement but no x-rays as of yet.  Will obtain x-rays of his neck  He is advised to repeat cryotherapy of his warts may be needed, he will come in as needed  Signed Abbe Amsterdam, MD

## 2022-12-07 NOTE — Patient Instructions (Incomplete)
It was good to see you again today We can freeze the plantar warts on your right foot again in a month or so- let me know  We will also get x-rays of your neck

## 2022-12-07 NOTE — Progress Notes (Signed)
Hematology and Oncology Follow Up Visit  Brian Mays [id number] 25-Jun-1946 77 y.o. 12/07/2022   Principle Diagnosis:  S/p yttrium-90 intrahepatic therapy - November 2016 Lutathera (Lu 177) injection on 02/01/2018 - s/p cycle 4 SBRT to T6 lesion -- 11/06/2021   Current Therapy:        Somatuline 120 mg monthly IV Iron as indicated  -- Injectafer given on 01/2021   Interim History:  Brian Mays is here today to go over recent labs and imaging.  At his last visit on 11/16/2022 he had elevation of chromogranin A from 118 3 months ago to 163 2 months ago and 191.9 1 month ago. He also was found to have a new right posterior firm lymph node of concern on exam. He underwent subsequent PET scan and previously planned MR spine and is here today to review the results. He reports that he has been feeling well. His wife is still on hospice for her brain cancer.   No fever, chills, n/v, cough, rash, dizziness, SOB, chest pain, palpitations, abdominal pain or changes in bowel or bladder habits.  No swelling or tenderness in his extremities.  He has some numbness and tingling in the left arm secondary to a pinched nerve in the neck. He is currently in PT for this which seems to be helping.  No falls or syncope reported.  Appetite comes and goes. He is doing his best to stay well hydrated.  Wt Readings from Last 3 Encounters:  11/16/22 182 lb (82.6 kg)  10/18/22 179 lb 12.8 oz (81.6 kg)  09/30/22 181 lb 12.8 oz (82.5 kg)    ECOG Performance Status: 1 - Symptomatic but completely ambulatory  Medications:  Allergies as of 12/07/2022       Reactions   Iohexol Hives    Code: HIVES, Desc: PER Brian Mays @ PRIMARY CARE, PT IS ALLERGIC TO CONTRAST DYE 10/02/08/RM  05/01/10...needs full premeds per our protocol w/ [facility]., Onset Date: [id number]   Iodinated Contrast Media Hives        Medication List        Accurate as of December 07, 2022 11:21 AM. If you have any questions, ask your nurse or doctor.           aspirin EC 81 MG tablet Take 81 mg by mouth every morning.   atorvastatin 80 MG tablet Commonly known as: LIPITOR Take 1 tablet (80 mg total) by mouth daily.   Cholecalciferol 25 MCG (1000 UT) tablet Take 1,000 Units by mouth 2 (two) times daily.   clopidogrel 75 MG tablet Commonly known as: PLAVIX TAKE 1 TABLET BY MOUTH EVERY DAY   cyanocobalamin 1000 MCG tablet Commonly known as: VITAMIN B12 Take 1,000 mcg by mouth every other day.   diphenhydramine-acetaminophen 25-500 MG Tabs tablet Commonly known as: TYLENOL PM Take 1 tablet by mouth at bedtime as needed (sleep/pain).   fluticasone 50 MCG/ACT nasal spray Commonly known as: FLONASE Place 2 sprays into both nostrils daily as needed (sinuses).   hydrocortisone 25 MG suppository Commonly known as: ANUSOL-HC INSERT 1 SUPPOSITORY RECTALLY TWICE A DAY AS NEEDED FOR HEMORRHOIDS   ipratropium 0.03 % nasal spray Commonly known as: Atrovent Place 2 sprays into the nose 3 (three) times daily. Use as needed for nasal drip   lansoprazole 30 MG capsule Commonly known as: PREVACID TAKE 1 CAPSULE (30 MG TOTAL) BY MOUTH DAILY AT 12 NOON.   levothyroxine 50 MCG tablet Commonly known as: SYNTHROID TAKE 1 TABLET BY MOUTH EVERY  DAY BEFORE BREAKFAST   losartan 50 MG tablet Commonly known as: COZAAR Take 1 tablet (50 mg total) by mouth daily.   metoprolol tartrate 25 MG tablet Commonly known as: LOPRESSOR Take 0.5 tablets (12.5 mg total) by mouth 2 (two) times daily.   nitroGLYCERIN 0.4 MG SL tablet Commonly known as: NITROSTAT PLACE 1 TABLET UNDER THE TONGUE EVERY 5 (FIVE) MINUTES X 3 DOSES AS NEEDED FOR CHEST PAIN.   ranolazine 500 MG 12 hr tablet Commonly known as: RANEXA Take 1 tablet (500 mg total) by mouth 2 (two) times daily.   SOMATULINE DEPOT [place] Inject 120 mcg into the skin every 28 (twenty-eight) days. Receives at Dr Brian Mays office   SOOTHE XP OP Place 1 drop into both eyes daily as needed (dry  eyes).        Allergies:  Allergies  Allergen Reactions   Iohexol Hives     Code: HIVES, Desc: PER Brian Mays @ [facility]IMARY [facility], PT IS ALLERGIC TO CONTRAST DYE 10/02/08/RM  05/01/10...needs full premeds per our protocol w/ [facility]., Onset Date: [id number]    Iodinated Contrast Media Hives    Past Medical History, Surgical history, Social history, and Family History were reviewed and updated.  Review of Systems: All other 10 point review of systems is negative.   Physical Exam:  vitals were not taken for this visit.   Wt Readings from Last 3 Encounters:  11/16/22 182 lb (82.6 kg)  10/18/22 179 lb 12.8 oz (81.6 kg)  09/30/22 181 lb 12.8 oz (82.5 kg)    Ocular: Sclerae unicteric, pupils equal, round and reactive to light Ear-nose-throat: Oropharynx clear, dentition fair Lymphatic: right posterior cervical lymph has almost fully resolved Lungs no rales or rhonchi, good excursion bilaterally Heart regular rate and rhythm, no murmur appreciated Abd soft, nontender, positive bowel sounds MSK no focal spinal tenderness, no joint edema Neuro: non-focal, well-oriented, appropriate affect  Lab Results  Component Value Date   WBC 6.4 11/16/2022   HGB 11.4 (L) 11/16/2022   HCT 36.0 (L) 11/16/2022   MCV 94.5 11/16/2022   PLT 127 (L) 11/16/2022   Lab Results  Component Value Date   FERRITIN 80 11/16/2022   IRON 53 11/16/2022   TIBC 290 11/16/2022   UIBC 237 11/16/2022   IRONPCTSAT 18 11/16/2022   Lab Results  Component Value Date   RETICCTPCT 1.2 06/14/2022   RBC 3.81 (L) 11/16/2022   No results found for: "KPAFRELGTCHN", "LAMBDASER", "KAPLAMBRATIO" No results found for: "IGGSERUM", "IGA", "IGMSERUM" No results found for: "TOTALPROTELP", "ALBUMINELP", "A1GS", "A2GS", "BETS", "BETA2SER", "GAMS", "MSPIKE", "SPEI"   Chemistry      Component Value Date/Time   NA 141 11/16/2022 1000   NA 141 06/22/2022 0942   NA 144 10/04/2017 0904   NA 141 07/23/2016 1255   K 4.6 11/16/2022  1000   K 3.8 10/04/2017 0904   K 4.1 07/23/2016 1255   CL 103 11/16/2022 1000   CL 103 10/04/2017 0904   CO2 33 (H) 11/16/2022 1000   CO2 28 10/04/2017 0904   CO2 27 07/23/2016 1255   BUN 16 11/16/2022 1000   BUN 20 06/22/2022 0942   BUN 14 10/04/2017 0904   BUN 13.8 07/23/2016 1255   CREATININE 0.98 11/16/2022 1000   CREATININE 0.8 10/04/2017 0904   CREATININE 1.0 07/23/2016 1255      Component Value Date/Time   CALCIUM 9.6 11/16/2022 1000   CALCIUM 9.4 10/04/2017 0904   CALCIUM 9.2 07/23/2016 1255   ALKPHOS 44 11/16/2022 1000  ALKPHOS 64 10/04/2017 0904   ALKPHOS 77 07/23/2016 1255   AST 16 11/16/2022 1000   AST 23 07/23/2016 1255   ALT 9 11/16/2022 1000   ALT 22 10/04/2017 0904   ALT 13 07/23/2016 1255   BILITOT 0.7 11/16/2022 1000   BILITOT 1.13 07/23/2016 1255      Encounter Diagnoses  Name Primary?   Metastatic malignant neuroendocrine tumor to liver [facility]) Yes   Malignant neoplasm metastatic to thoracic vertebral column with unknown primary site [facility])    Posterior cervical lymphadenopathy      Impression and Plan: Mr. Canada is a very pleasant 77 yo caucasian gentleman with metastatic low-grade neuroendocrine carcinoma with hepatic metastasis.   We reviewed his latest Chromogranin A lab which went down from 191.9 to 175.5. We also reviewed his recent lumbar and thoracic MR scans which were stable as well as his recent PET scan from 12/01/2022 which showed no hypermetabolic lesions. This is great news. On exam the lymph node enlargement of concern is almost fully resolved.  He has follow up next week for his monthly disease monitoring. No changes to plan at this time.   Hughie Closs, PA-C 2/6/202411:21 AM

## 2022-12-09 ENCOUNTER — Ambulatory Visit (HOSPITAL_BASED_OUTPATIENT_CLINIC_OR_DEPARTMENT_OTHER)
Admission: RE | Admit: 2022-12-09 | Discharge: 2022-12-09 | Disposition: A | Discharge status: Home / Self Care | Payer: Medicare HMO | Source: Ambulatory Visit | Attending: Family Medicine | Admitting: Family Medicine

## 2022-12-09 ENCOUNTER — Ambulatory Visit (INDEPENDENT_AMBULATORY_CARE_PROVIDER_SITE_OTHER): Payer: Medicare HMO | Admitting: Family Medicine

## 2022-12-09 VITALS — BP 138/68 | HR 63 | Temp 97.9°F | Resp 18 | Ht 70.0 in | Wt 184.2 lb

## 2022-12-09 DIAGNOSIS — B070 Plantar wart: Secondary | ICD-10-CM | POA: Diagnosis not present

## 2022-12-09 DIAGNOSIS — R202 Paresthesia of skin: Secondary | ICD-10-CM | POA: Insufficient documentation

## 2022-12-12 ENCOUNTER — Encounter: Payer: Self-pay | Admitting: Family Medicine

## 2022-12-12 DIAGNOSIS — R202 Paresthesia of skin: Secondary | ICD-10-CM

## 2022-12-12 DIAGNOSIS — I6523 Occlusion and stenosis of bilateral carotid arteries: Secondary | ICD-10-CM

## 2022-12-16 ENCOUNTER — Inpatient Hospital Stay: Payer: Medicare HMO

## 2022-12-16 ENCOUNTER — Inpatient Hospital Stay (HOSPITAL_BASED_OUTPATIENT_CLINIC_OR_DEPARTMENT_OTHER): Payer: Medicare HMO | Admitting: Medical Oncology

## 2022-12-16 VITALS — BP 145/65 | HR 61 | Temp 97.8°F | Resp 18 | Ht 70.0 in | Wt 185.0 lb

## 2022-12-16 DIAGNOSIS — D500 Iron deficiency anemia secondary to blood loss (chronic): Secondary | ICD-10-CM

## 2022-12-16 DIAGNOSIS — R030 Elevated blood-pressure reading, without diagnosis of hypertension: Secondary | ICD-10-CM | POA: Diagnosis not present

## 2022-12-16 DIAGNOSIS — Z79899 Other long term (current) drug therapy: Secondary | ICD-10-CM | POA: Diagnosis not present

## 2022-12-16 DIAGNOSIS — C7B8 Other secondary neuroendocrine tumors: Secondary | ICD-10-CM

## 2022-12-16 DIAGNOSIS — R0989 Other specified symptoms and signs involving the circulatory and respiratory systems: Secondary | ICD-10-CM | POA: Diagnosis not present

## 2022-12-16 DIAGNOSIS — I6523 Occlusion and stenosis of bilateral carotid arteries: Secondary | ICD-10-CM | POA: Diagnosis not present

## 2022-12-16 DIAGNOSIS — C7A8 Other malignant neuroendocrine tumors: Secondary | ICD-10-CM | POA: Diagnosis not present

## 2022-12-16 LAB — CBC WITH DIFFERENTIAL (CANCER CENTER ONLY)
Abs Immature Granulocytes: 0.01 10*3/uL (ref 0.00–0.07)
Basophils Absolute: 0.1 10*3/uL (ref 0.0–0.1)
Basophils Relative: 1 %
Eosinophils Absolute: 0.5 10*3/uL (ref 0.0–0.5)
Eosinophils Relative: 9 %
HCT: 33.7 % — ABNORMAL LOW (ref 39.0–52.0)
Hemoglobin: 10.6 g/dL — ABNORMAL LOW (ref 13.0–17.0)
Immature Granulocytes: 0 %
Lymphocytes Relative: 17 %
Lymphs Abs: 0.8 10*3/uL (ref 0.7–4.0)
MCH: 29.6 pg (ref 26.0–34.0)
MCHC: 31.5 g/dL (ref 30.0–36.0)
MCV: 94.1 fL (ref 80.0–100.0)
Monocytes Absolute: 0.4 10*3/uL (ref 0.1–1.0)
Monocytes Relative: 7 %
Neutro Abs: 3.3 10*3/uL (ref 1.7–7.7)
Neutrophils Relative %: 66 %
Platelet Count: 124 10*3/uL — ABNORMAL LOW (ref 150–400)
RBC: 3.58 MIL/uL — ABNORMAL LOW (ref 4.22–5.81)
RDW: 14.6 % (ref 11.5–15.5)
WBC Count: 5 10*3/uL (ref 4.0–10.5)
nRBC: 0 % (ref 0.0–0.2)

## 2022-12-16 LAB — CMP (CANCER CENTER ONLY)
ALT: 11 U/L (ref 0–44)
AST: 20 U/L (ref 15–41)
Albumin: 3.9 g/dL (ref 3.5–5.0)
Alkaline Phosphatase: 49 U/L (ref 38–126)
Anion gap: 7 (ref 5–15)
BUN: 18 mg/dL (ref 8–23)
CO2: 30 mmol/L (ref 22–32)
Calcium: 9.3 mg/dL (ref 8.9–10.3)
Chloride: 104 mmol/L (ref 98–111)
Creatinine: 1.03 mg/dL (ref 0.61–1.24)
GFR, Estimated: >60 mL/min (ref >60)
Glucose, Bld: 91 mg/dL (ref 70–99)
Potassium: 4.1 mmol/L (ref 3.5–5.1)
Sodium: 141 mmol/L (ref 135–145)
Total Bilirubin: 0.8 mg/dL (ref 0.3–1.2)
Total Protein: 6.9 g/dL (ref 6.5–8.1)

## 2022-12-16 LAB — LACTATE DEHYDROGENASE: LDH: 191 U/L (ref 98–192)

## 2022-12-16 LAB — IRON AND IRON BINDING CAPACITY (CC-WL,HP ONLY)
Iron: 48 ug/dL (ref 45–182)
Saturation Ratios: 16 % — ABNORMAL LOW (ref 17.9–39.5)
TIBC: 300 ug/dL (ref 250–450)
UIBC: 252 ug/dL (ref 117–376)

## 2022-12-16 LAB — TSH: TSH: 3.282 u[IU]/mL (ref 0.350–4.500)

## 2022-12-16 LAB — FERRITIN: Ferritin: 50 ng/mL (ref 24–336)

## 2022-12-16 MED ORDER — LANREOTIDE ACETATE 120 MG/0.5ML ~~LOC~~ SOLN
120.0000 mg | Freq: Once | SUBCUTANEOUS | Status: AC
  Administered 2022-12-16: 120 mg via SUBCUTANEOUS
  Filled 2022-12-16: qty 120

## 2022-12-16 NOTE — Progress Notes (Signed)
Hematology and Oncology Follow Up Visit  Brian Mays [id number] 03/24/1946 77 y.o. 12/16/2022   Principle Diagnosis:  S/p yttrium-90 intrahepatic therapy - November 2016 Lutathera (Lu 177) injection on 02/01/2018 - s/p cycle 4 SBRT to T6 lesion -- 11/06/2021   Current Therapy:        Somatuline 120 mg monthly IV Iron as indicated  -- Injectafer given on 01/2021   Interim History:  Brian Mays is here today for his normal follow up and consideration of Somatuline. He reports that since our last visit he has been seen by ortho for neck pain. H underwent an x ray which showed arthritis of his neck along with atherosclerosis of his carotid arteries. Due to this his PCP ordered a neck MRI and carotid doppler.   No fever, chills, n/v, cough, rash, dizziness, SOB, chest pain, palpitations, abdominal pain or changes in bowel or bladder habits.  No swelling or tenderness in his extremities.  He has some numbness and tingling in the left arm secondary to a pinched nerve in the neck. He is currently in PT for this which seems to be helping.  No falls or syncope reported.  Appetite comes and goes. He is doing his best to stay well hydrated.  Wt Readings from Last 3 Encounters:  12/16/22 185 lb (83.9 kg)  12/09/22 184 lb 3.2 oz (83.6 kg)  12/07/22 183 lb (83 kg)    ECOG Performance Status: 1 - Symptomatic but completely ambulatory  Medications:  Allergies as of 12/16/2022       Reactions   Iohexol Hives    Code: HIVES, Desc: PER [name] @ PRIMARY CARE, PT IS ALLERGIC TO CONTRAST DYE 10/02/08/RM  05/01/10...needs full premeds per our protocol w/ [facility]., Onset Date: [id number]   Iodinated Contrast Media Hives        Medication List        Accurate as of December 16, 2022 11:26 AM. If you have any questions, ask your nurse or doctor.          aspirin EC 81 MG tablet Take 81 mg by mouth every morning.   atorvastatin 80 MG tablet Commonly known as: LIPITOR Take 1 tablet (80 mg  total) by mouth daily.   Cholecalciferol 25 MCG (1000 UT) tablet Take 1,000 Units by mouth 2 (two) times daily.   clopidogrel 75 MG tablet Commonly known as: PLAVIX TAKE 1 TABLET BY MOUTH EVERY DAY   cyanocobalamin 1000 MCG tablet Commonly known as: VITAMIN B12 Take 1,000 mcg by mouth every other day.   diphenhydramine-acetaminophen 25-500 MG Tabs tablet Commonly known as: TYLENOL PM Take 1 tablet by mouth at bedtime as needed (sleep/pain).   fluticasone 50 MCG/ACT nasal spray Commonly known as: FLONASE Place 2 sprays into both nostrils daily as needed (sinuses).   hydrocortisone 25 MG suppository Commonly known as: ANUSOL-HC INSERT 1 SUPPOSITORY RECTALLY TWICE A DAY AS NEEDED FOR HEMORRHOIDS   ipratropium 0.03 % nasal spray Commonly known as: Atrovent Place 2 sprays into the nose 3 (three) times daily. Use as needed for nasal drip   lansoprazole 30 MG capsule Commonly known as: PREVACID TAKE 1 CAPSULE (30 MG TOTAL) BY MOUTH DAILY AT 12 NOON.   levothyroxine 50 MCG tablet Commonly known as: SYNTHROID TAKE 1 TABLET BY MOUTH EVERY DAY BEFORE BREAKFAST   losartan 50 MG tablet Commonly known as: COZAAR Take 1 tablet (50 mg total) by mouth daily.   metoprolol tartrate 25 MG tablet Commonly known as: LOPRESSOR Take 0.5  tablets (12.5 mg total) by mouth 2 (two) times daily.   nitroGLYCERIN 0.4 MG SL tablet Commonly known as: NITROSTAT PLACE 1 TABLET UNDER THE TONGUE EVERY 5 (FIVE) MINUTES X 3 DOSES AS NEEDED FOR CHEST PAIN.   ranolazine 500 MG 12 hr tablet Commonly known as: RANEXA Take 1 tablet (500 mg total) by mouth 2 (two) times daily.   SOMATULINE DEPOT [place] Inject 120 mcg into the skin every 28 (twenty-eight) days. Receives at Dr Antonieta Pert office   SOOTHE XP OP Place 1 drop into both eyes daily as needed (dry eyes).        Allergies:  Allergies  Allergen Reactions   Iohexol Hives     Code: HIVES, Desc: PER [name] @ [facility]IMARY [facility]ARE, PT IS ALLERGIC TO CONTRAST  DYE 10/02/08/RM  05/01/10...needs full premeds per our protocol w/ [facility]., Onset Date: [id number]    Iodinated Contrast Media Hives    Past Medical History, Surgical history, Social history, and Family History were reviewed and updated.  Review of Systems: All other 10 point review of systems is negative.   Physical Exam:  height is 5' 10"$  (1.778 m) and weight is 185 lb (83.9 kg). His oral temperature is 97.8 F (36.6 C). His blood pressure is 145/65 (abnormal) and his pulse is 61. His respiration is 18 and oxygen saturation is 99%.   Wt Readings from Last 3 Encounters:  12/16/22 185 lb (83.9 kg)  12/09/22 184 lb 3.2 oz (83.6 kg)  12/07/22 183 lb (83 kg)    Ocular: Sclerae unicteric, pupils equal, round and reactive to light Ear-nose-throat: Oropharynx clear, dentition fair Lymphatic: right posterior cervical lymph has resolved Lungs no rales or rhonchi, good excursion bilaterally Heart regular rate and rhythm, no murmur appreciated Abd soft, nontender, positive bowel sounds MSK no focal spinal tenderness, no joint edema Neuro: non-focal, well-oriented, appropriate affect  Lab Results  Component Value Date   WBC 5.0 12/16/2022   HGB 10.6 (L) 12/16/2022   HCT 33.7 (L) 12/16/2022   MCV 94.1 12/16/2022   PLT 124 (L) 12/16/2022   Lab Results  Component Value Date   FERRITIN 80 11/16/2022   IRON 53 11/16/2022   TIBC 290 11/16/2022   UIBC 237 11/16/2022   IRONPCTSAT 18 11/16/2022   Lab Results  Component Value Date   RETICCTPCT 1.2 06/14/2022   RBC 3.58 (L) 12/16/2022   No results found for: "KPAFRELGTCHN", "LAMBDASER", "KAPLAMBRATIO" No results found for: "IGGSERUM", "IGA", "IGMSERUM" No results found for: "TOTALPROTELP", "ALBUMINELP", "A1GS", "A2GS", "BETS", "BETA2SER", "GAMS", "MSPIKE", "SPEI"   Chemistry      Component Value Date/Time   NA 141 12/16/2022 1052   NA 141 06/22/2022 0942   NA 144 10/04/2017 0904   NA 141 07/23/2016 1255   K 4.1 12/16/2022 1052    K 3.8 10/04/2017 0904   K 4.1 07/23/2016 1255   CL 104 12/16/2022 1052   CL 103 10/04/2017 0904   CO2 30 12/16/2022 1052   CO2 28 10/04/2017 0904   CO2 27 07/23/2016 1255   BUN 18 12/16/2022 1052   BUN 20 06/22/2022 0942   BUN 14 10/04/2017 0904   BUN 13.8 07/23/2016 1255   CREATININE 1.03 12/16/2022 1052   CREATININE 0.8 10/04/2017 0904   CREATININE 1.0 07/23/2016 1255      Component Value Date/Time   CALCIUM 9.3 12/16/2022 1052   CALCIUM 9.4 10/04/2017 0904   CALCIUM 9.2 07/23/2016 1255   ALKPHOS 49 12/16/2022 1052   ALKPHOS 64 10/04/2017 0904  ALKPHOS 77 07/23/2016 1255   AST 20 12/16/2022 1052   AST 23 07/23/2016 1255   ALT 11 12/16/2022 1052   ALT 22 10/04/2017 0904   ALT 13 07/23/2016 1255   BILITOT 0.8 12/16/2022 1052   BILITOT 1.13 07/23/2016 1255      Encounter Diagnoses  Name Primary?   Metastatic malignant neuroendocrine tumor to liver (HCC) Yes   Iron deficiency anemia due to chronic blood loss    Impression and Plan: Mr. Degood is a very pleasant 77 yo caucasian gentleman with metastatic low-grade neuroendocrine carcinoma with hepatic metastasis.    Chromogranin A lab pending today but previously which went down from 191.9 to 175.5. Recent PET scan from 12/01/2022 which showed no hypermetabolic lesions. CBC roughly stable-hgb down slight from 11.4 to 10.6. CMP stable.   I discussed his elevated BP with patient. This is a new finding for patient. Has been under more stress and pain. Elevated BP a potential side effect from his treatment so we will need to try to figure out exact cause. Given his history of carotid atherosclerosis and new elevated BP I would suggest a renal artery [place]- acknowledge that creatinine is normal.   Continue PCP follow up  Disposition: Somatuline today Renal artery doppler US ordered RTC 1 month MD, labs, [place], [place] 2/15/202411:26 AM

## 2022-12-16 NOTE — Patient Instructions (Signed)
Lanreotide Injection What is this medication? LANREOTIDE (lan REE oh tide) treats high levels of growth hormone (acromegaly). It is used when other therapies have not worked well enough or cannot be tolerated. It works by reducing the amount of growth hormone your body makes. This reduces symptoms and the risk of health problems caused by too much growth hormone, such as diabetes and heart disease. It may also be used to treat neuroendocrine tumors, a cancer of the cells that release hormones and other substances in your body. It works by slowing down the release of these substances from the cells. This slows tumor growth. It also decreases the symptoms of carcinoid syndrome, such as flushing or diarrhea. This medicine may be used for other purposes; ask your health care provider or pharmacist if you have questions. COMMON BRAND NAME(S): Somatuline [place] What should I tell my care team before I take this medication? They need to know if you have any of these conditions: Diabetes Gallbladder disease Heart disease Kidney disease Liver disease Thyroid disease An unusual or allergic reaction to lanreotide, other medications, foods, dyes, or preservatives Pregnant or trying to get pregnant Breast-feeding How should I use this medication? This medication is injected under the skin. It is given by your care team in a hospital or clinic setting. Talk to your care team about the use of this medication in children. Special care may be needed. Overdosage: If you think you have taken too much of this medicine contact a poison control center or emergency room at once. NOTE: This medicine is only for you. Do not share this medicine with others. What if I miss a dose? Keep appointments for follow-up doses. It is important not to miss your dose. Call your care team if you are unable to keep an appointment. What may interact with this medication? Bromocriptine Cyclosporine Certain medications for blood  pressure, heart disease, irregular heartbeat Certain medications for diabetes Quinidine Terfenadine This list may not describe all possible interactions. Give your health care provider a list of all the medicines, herbs, non-prescription drugs, or dietary supplements you use. Also tell them if you smoke, drink alcohol, or use illegal drugs. Some items may interact with your medicine. What should I watch for while using this medication? Visit your care team for regular checks on your progress. Tell your care team if your symptoms do not start to get better or if they get worse. Your condition will be monitored carefully while you are receiving this medication. You may need blood work while you are taking this medication. This medication may increase blood sugar. The risk may be higher in patients who already have diabetes. Ask your care team what you can do to lower your risk of diabetes while taking this medication. Talk to your care team if you wish to become pregnant or think you may be pregnant. This medication can cause serious birth defects. Do not breast-feed while taking this medication and for 6 months after stopping therapy. This medication may cause infertility. Talk to your care team if you are concerned about your fertility. What side effects may I notice from receiving this medication? Side effects that you should report to your care team as soon as possible: Allergic reactions--skin rash, itching, hives, swelling of the face, lips, tongue, or throat Gallbladder problems--severe stomach pain, nausea, vomiting, fever High blood sugar (hyperglycemia)--increased thirst or amount of urine, unusual weakness or fatigue, blurry vision Increase in blood pressure Low blood sugar (hypoglycemia)--tremors or shaking, anxiety, sweating, cold   or clammy skin, confusion, dizziness, rapid heartbeat Low thyroid levels (hypothyroidism)--unusual weakness or fatigue, increased sensitivity to cold,  constipation, hair loss, dry skin, weight gain, feelings of depression Slow heartbeat--dizziness, feeling faint or lightheaded, confusion, trouble breathing, unusual weakness or fatigue Side effects that usually do not require medical attention (report to your care team if they continue or are bothersome): Diarrhea Dizziness Headache Muscle spasms Nausea Pain, redness, irritation, or bruising at the injection site Stomach pain This list may not describe all possible side effects. Call your doctor for medical advice about side effects. You may report side effects to FDA at 1-800-FDA-1088. Where should I keep my medication? This medication is given in a hospital or clinic. It will not be stored at home. NOTE: This sheet is a summary. It may not cover all possible information. If you have questions about this medicine, talk to your doctor, pharmacist, or health care provider.  2023 Elsevier/Gold Standard (2021-12-18 00:00:00)  

## 2022-12-17 ENCOUNTER — Other Ambulatory Visit: Payer: Self-pay | Admitting: Medical Oncology

## 2022-12-17 ENCOUNTER — Encounter: Payer: Self-pay | Admitting: *Deleted

## 2022-12-17 LAB — CHROMOGRANIN A: Chromogranin A (ng/mL): 165.1 ng/mL — ABNORMAL HIGH (ref 0.0–101.8)

## 2022-12-21 ENCOUNTER — Ambulatory Visit (HOSPITAL_COMMUNITY)
Admission: RE | Admit: 2022-12-21 | Discharge: 2022-12-21 | Disposition: A | Discharge status: Home / Self Care | Payer: Medicare HMO | Source: Ambulatory Visit | Attending: Medical Oncology | Admitting: Medical Oncology

## 2022-12-21 DIAGNOSIS — Z87891 Personal history of nicotine dependence: Secondary | ICD-10-CM | POA: Insufficient documentation

## 2022-12-21 DIAGNOSIS — I10 Essential (primary) hypertension: Secondary | ICD-10-CM | POA: Diagnosis not present

## 2022-12-21 DIAGNOSIS — C7B8 Other secondary neuroendocrine tumors: Secondary | ICD-10-CM | POA: Insufficient documentation

## 2022-12-21 DIAGNOSIS — I2510 Atherosclerotic heart disease of native coronary artery without angina pectoris: Secondary | ICD-10-CM | POA: Insufficient documentation

## 2022-12-21 DIAGNOSIS — R030 Elevated blood-pressure reading, without diagnosis of hypertension: Secondary | ICD-10-CM | POA: Diagnosis not present

## 2022-12-21 DIAGNOSIS — R0989 Other specified symptoms and signs involving the circulatory and respiratory systems: Secondary | ICD-10-CM | POA: Diagnosis not present

## 2022-12-21 DIAGNOSIS — E785 Hyperlipidemia, unspecified: Secondary | ICD-10-CM | POA: Diagnosis not present

## 2022-12-22 ENCOUNTER — Encounter: Payer: Self-pay | Admitting: Family

## 2022-12-22 ENCOUNTER — Encounter: Payer: Self-pay | Admitting: Family Medicine

## 2022-12-22 NOTE — Telephone Encounter (Signed)
Per scheduling message [place] patient and lvm for callback to schedule (3) doses of IV Iron

## 2022-12-23 ENCOUNTER — Encounter: Payer: Self-pay | Admitting: Family Medicine

## 2022-12-23 ENCOUNTER — Ambulatory Visit (HOSPITAL_BASED_OUTPATIENT_CLINIC_OR_DEPARTMENT_OTHER): Admission: RE | Admit: 2022-12-23 | Payer: Medicare HMO | Source: Ambulatory Visit

## 2022-12-23 NOTE — Addendum Note (Signed)
Addended byDamita Dunnings D on: 12/23/2022 08:46 AM   Modules accepted: Orders

## 2022-12-24 ENCOUNTER — Ambulatory Visit (HOSPITAL_BASED_OUTPATIENT_CLINIC_OR_DEPARTMENT_OTHER)
Admission: RE | Admit: 2022-12-24 | Discharge: 2022-12-24 | Disposition: A | Discharge status: Home / Self Care | Payer: Medicare HMO | Source: Ambulatory Visit | Attending: Family Medicine | Admitting: Family Medicine

## 2022-12-24 ENCOUNTER — Inpatient Hospital Stay: Payer: Medicare HMO

## 2022-12-24 ENCOUNTER — Encounter: Payer: Self-pay | Admitting: Family Medicine

## 2022-12-24 VITALS — BP 141/54 | HR 55 | Temp 98.1°F | Resp 17

## 2022-12-24 DIAGNOSIS — R030 Elevated blood-pressure reading, without diagnosis of hypertension: Secondary | ICD-10-CM | POA: Diagnosis not present

## 2022-12-24 DIAGNOSIS — I6523 Occlusion and stenosis of bilateral carotid arteries: Secondary | ICD-10-CM | POA: Insufficient documentation

## 2022-12-24 DIAGNOSIS — C7B8 Other secondary neuroendocrine tumors: Secondary | ICD-10-CM

## 2022-12-24 DIAGNOSIS — C7A8 Other malignant neuroendocrine tumors: Secondary | ICD-10-CM | POA: Diagnosis not present

## 2022-12-24 DIAGNOSIS — Z79899 Other long term (current) drug therapy: Secondary | ICD-10-CM | POA: Diagnosis not present

## 2022-12-24 DIAGNOSIS — I10 Essential (primary) hypertension: Secondary | ICD-10-CM | POA: Diagnosis not present

## 2022-12-24 DIAGNOSIS — D500 Iron deficiency anemia secondary to blood loss (chronic): Secondary | ICD-10-CM | POA: Diagnosis not present

## 2022-12-24 MED ORDER — SODIUM CHLORIDE 0.9 % IV SOLN
200.0000 mg | Freq: Once | INTRAVENOUS | Status: AC
  Administered 2022-12-24: 200 mg via INTRAVENOUS
  Filled 2022-12-24: qty 200

## 2022-12-24 MED ORDER — SODIUM CHLORIDE 0.9 % IV SOLN: INTRAVENOUS | Status: DC

## 2022-12-24 NOTE — Patient Instructions (Signed)

## 2022-12-28 ENCOUNTER — Encounter: Payer: Self-pay | Admitting: Family Medicine

## 2022-12-31 ENCOUNTER — Inpatient Hospital Stay: Payer: Medicare HMO | Attending: Hematology & Oncology

## 2022-12-31 VITALS — BP 170/71 | HR 59 | Temp 97.8°F | Resp 18

## 2022-12-31 DIAGNOSIS — D500 Iron deficiency anemia secondary to blood loss (chronic): Secondary | ICD-10-CM

## 2022-12-31 DIAGNOSIS — C7A8 Other malignant neuroendocrine tumors: Secondary | ICD-10-CM | POA: Diagnosis not present

## 2022-12-31 DIAGNOSIS — C7B8 Other secondary neuroendocrine tumors: Secondary | ICD-10-CM

## 2022-12-31 MED ORDER — SODIUM CHLORIDE 0.9 % IV SOLN: INTRAVENOUS | Status: DC

## 2022-12-31 MED ORDER — SODIUM CHLORIDE 0.9 % IV SOLN
200.0000 mg | Freq: Once | INTRAVENOUS | Status: DC
  Filled 2022-12-31: qty 10

## 2022-12-31 NOTE — Patient Instructions (Addendum)
Iron Sucrose Injection What is this medication? IRON SUCROSE (EYE ern SOO krose) treats low levels of iron (iron deficiency anemia) in people with kidney disease. Iron is a mineral that plays an important role in making red blood cells, which carry oxygen from your lungs to the rest of your body. This medicine may be used for other purposes; ask your health care provider or pharmacist if you have questions. COMMON BRAND NAME(S): Venofer What should I tell my care team before I take this medication? They need to know if you have any of these conditions: Anemia not caused by low iron levels Heart disease High levels of iron in the blood Kidney disease Liver disease An unusual or allergic reaction to iron, other medications, foods, dyes, or preservatives Pregnant or trying to get pregnant Breastfeeding How should I use this medication? This medication is for infusion into a vein. It is given in a hospital or clinic setting. Talk to your care team about the use of this medication in children. While this medication may be prescribed for children as young as 2 years for selected conditions, precautions do apply. Overdosage: If you think you have taken too much of this medicine contact a poison control center or emergency room at once. NOTE: This medicine is only for you. Do not share this medicine with others. What if I miss a dose? Keep appointments for follow-up doses. It is important not to miss your dose. Call your care team if you are unable to keep an appointment. What may interact with this medication? Do not take this medication with any of the following: Deferoxamine Dimercaprol Other iron products This medication may also interact with the following: Chloramphenicol Deferasirox This list may not describe all possible interactions. Give your health care provider a list of all the medicines, herbs, non-prescription drugs, or dietary supplements you use. Also tell them if you smoke,  drink alcohol, or use illegal drugs. Some items may interact with your medicine. What should I watch for while using this medication? Visit your care team regularly. Tell your care team if your symptoms do not start to get better or if they get worse. You may need blood work done while you are taking this medication. You may need to follow a special diet. Talk to your care team. Foods that contain iron include: whole grains/cereals, dried fruits, beans, or peas, leafy green vegetables, and organ meats (liver, kidney). What side effects may I notice from receiving this medication? Side effects that you should report to your care team as soon as possible: Allergic reactions--skin rash, itching, hives, swelling of the face, lips, tongue, or throat Low blood pressure--dizziness, feeling faint or lightheaded, blurry vision Shortness of breath Side effects that usually do not require medical attention (report to your care team if they continue or are bothersome): Flushing Headache Joint pain Muscle pain Nausea Pain, redness, or irritation at injection site This list may not describe all possible side effects. Call your doctor for medical advice about side effects. You may report side effects to FDA at 1-800-FDA-1088. Where should I keep my medication? This medication is given in a hospital or clinic and will not be stored at home. NOTE: This sheet is a summary. It may not cover all possible information. If you have questions about this medicine, talk to your doctor, pharmacist, or health care provider.  2023 Elsevier/Gold Standard (2021-01-29 00:00:00) Iron Sucrose Injection What is this medication? IRON SUCROSE (EYE ern SOO krose) treats low levels of iron (  iron deficiency anemia) in people with kidney disease. Iron is a mineral that plays an important role in making red blood cells, which carry oxygen from your lungs to the rest of your body. This medicine may be used for other purposes; ask  your health care provider or pharmacist if you have questions. COMMON BRAND NAME(S): Venofer What should I tell my care team before I take this medication? They need to know if you have any of these conditions: Anemia not caused by low iron levels Heart disease High levels of iron in the blood Kidney disease Liver disease An unusual or allergic reaction to iron, other medications, foods, dyes, or preservatives Pregnant or trying to get pregnant Breastfeeding How should I use this medication? This medication is for infusion into a vein. It is given in a hospital or clinic setting. Talk to your care team about the use of this medication in children. While this medication may be prescribed for children as young as 2 years for selected conditions, precautions do apply. Overdosage: If you think you have taken too much of this medicine contact a poison control center or emergency room at once. NOTE: This medicine is only for you. Do not share this medicine with others. What if I miss a dose? Keep appointments for follow-up doses. It is important not to miss your dose. Call your care team if you are unable to keep an appointment. What may interact with this medication? Do not take this medication with any of the following: Deferoxamine Dimercaprol Other iron products This medication may also interact with the following: Chloramphenicol Deferasirox This list may not describe all possible interactions. Give your health care provider a list of all the medicines, herbs, non-prescription drugs, or dietary supplements you use. Also tell them if you smoke, drink alcohol, or use illegal drugs. Some items may interact with your medicine. What should I watch for while using this medication? Visit your care team regularly. Tell your care team if your symptoms do not start to get better or if they get worse. You may need blood work done while you are taking this medication. You may need to follow a  special diet. Talk to your care team. Foods that contain iron include: whole grains/cereals, dried fruits, beans, or peas, leafy green vegetables, and organ meats (liver, kidney). What side effects may I notice from receiving this medication? Side effects that you should report to your care team as soon as possible: Allergic reactions--skin rash, itching, hives, swelling of the face, lips, tongue, or throat Low blood pressure--dizziness, feeling faint or lightheaded, blurry vision Shortness of breath Side effects that usually do not require medical attention (report to your care team if they continue or are bothersome): Flushing Headache Joint pain Muscle pain Nausea Pain, redness, or irritation at injection site This list may not describe all possible side effects. Call your doctor for medical advice about side effects. You may report side effects to FDA at 1-800-FDA-1088. Where should I keep my medication? This medication is given in a hospital or clinic and will not be stored at home. NOTE: This sheet is a summary. It may not cover all possible information. If you have questions about this medicine, talk to your doctor, pharmacist, or health care provider.  2023 Elsevier/Gold Standard (2021-01-29 00:00:00)

## 2023-01-04 ENCOUNTER — Ambulatory Visit: Payer: Medicare HMO | Admitting: Medical Oncology

## 2023-01-07 ENCOUNTER — Inpatient Hospital Stay: Payer: Medicare HMO

## 2023-01-07 VITALS — BP 155/62 | HR 57 | Temp 97.9°F | Resp 16

## 2023-01-07 DIAGNOSIS — C7B8 Other secondary neuroendocrine tumors: Secondary | ICD-10-CM | POA: Diagnosis not present

## 2023-01-07 DIAGNOSIS — D500 Iron deficiency anemia secondary to blood loss (chronic): Secondary | ICD-10-CM

## 2023-01-07 DIAGNOSIS — C7A8 Other malignant neuroendocrine tumors: Secondary | ICD-10-CM | POA: Diagnosis not present

## 2023-01-07 MED ORDER — SODIUM CHLORIDE 0.9 % IV SOLN
200.0000 mg | Freq: Once | INTRAVENOUS | Status: AC
  Administered 2023-01-07: 200 mg via INTRAVENOUS
  Filled 2023-01-07: qty 200

## 2023-01-07 MED ORDER — SODIUM CHLORIDE 0.9 % IV SOLN: Freq: Once | INTRAVENOUS | Status: AC

## 2023-01-12 ENCOUNTER — Other Ambulatory Visit: Payer: Self-pay | Admitting: Family Medicine

## 2023-01-18 DIAGNOSIS — I25119 Atherosclerotic heart disease of native coronary artery with unspecified angina pectoris: Secondary | ICD-10-CM | POA: Diagnosis not present

## 2023-01-18 DIAGNOSIS — Z008 Encounter for other general examination: Secondary | ICD-10-CM | POA: Diagnosis not present

## 2023-01-18 DIAGNOSIS — E785 Hyperlipidemia, unspecified: Secondary | ICD-10-CM | POA: Diagnosis not present

## 2023-01-18 DIAGNOSIS — Z7902 Long term (current) use of antithrombotics/antiplatelets: Secondary | ICD-10-CM | POA: Diagnosis not present

## 2023-01-18 DIAGNOSIS — M1990 Unspecified osteoarthritis, unspecified site: Secondary | ICD-10-CM | POA: Diagnosis not present

## 2023-01-18 DIAGNOSIS — H9193 Unspecified hearing loss, bilateral: Secondary | ICD-10-CM | POA: Diagnosis not present

## 2023-01-18 DIAGNOSIS — Z87891 Personal history of nicotine dependence: Secondary | ICD-10-CM | POA: Diagnosis not present

## 2023-01-18 DIAGNOSIS — K219 Gastro-esophageal reflux disease without esophagitis: Secondary | ICD-10-CM | POA: Diagnosis not present

## 2023-01-18 DIAGNOSIS — Z91041 Radiographic dye allergy status: Secondary | ICD-10-CM | POA: Diagnosis not present

## 2023-01-18 DIAGNOSIS — I10 Essential (primary) hypertension: Secondary | ICD-10-CM | POA: Diagnosis not present

## 2023-01-18 DIAGNOSIS — Z8249 Family history of ischemic heart disease and other diseases of the circulatory system: Secondary | ICD-10-CM | POA: Diagnosis not present

## 2023-01-18 DIAGNOSIS — I739 Peripheral vascular disease, unspecified: Secondary | ICD-10-CM | POA: Diagnosis not present

## 2023-01-18 DIAGNOSIS — I252 Old myocardial infarction: Secondary | ICD-10-CM | POA: Diagnosis not present

## 2023-01-20 ENCOUNTER — Inpatient Hospital Stay (HOSPITAL_BASED_OUTPATIENT_CLINIC_OR_DEPARTMENT_OTHER): Payer: Medicare HMO | Admitting: Medical Oncology

## 2023-01-20 ENCOUNTER — Encounter: Payer: Self-pay | Admitting: Medical Oncology

## 2023-01-20 ENCOUNTER — Other Ambulatory Visit: Payer: Self-pay

## 2023-01-20 ENCOUNTER — Inpatient Hospital Stay: Payer: Medicare HMO

## 2023-01-20 VITALS — BP 131/52 | HR 56 | Temp 98.1°F | Resp 18 | Ht 67.0 in | Wt 182.4 lb

## 2023-01-20 DIAGNOSIS — C7B8 Other secondary neuroendocrine tumors: Secondary | ICD-10-CM

## 2023-01-20 DIAGNOSIS — D500 Iron deficiency anemia secondary to blood loss (chronic): Secondary | ICD-10-CM | POA: Diagnosis not present

## 2023-01-20 DIAGNOSIS — C7A8 Other malignant neuroendocrine tumors: Secondary | ICD-10-CM | POA: Diagnosis not present

## 2023-01-20 LAB — CBC WITH DIFFERENTIAL (CANCER CENTER ONLY)
Abs Immature Granulocytes: 0.02 10*3/uL (ref 0.00–0.07)
Basophils Absolute: 0 10*3/uL (ref 0.0–0.1)
Basophils Relative: 1 %
Eosinophils Absolute: 0.3 10*3/uL (ref 0.0–0.5)
Eosinophils Relative: 6 %
HCT: 35.8 % — ABNORMAL LOW (ref 39.0–52.0)
Hemoglobin: 11.3 g/dL — ABNORMAL LOW (ref 13.0–17.0)
Immature Granulocytes: 0 %
Lymphocytes Relative: 15 %
Lymphs Abs: 0.8 10*3/uL (ref 0.7–4.0)
MCH: 29.4 pg (ref 26.0–34.0)
MCHC: 31.6 g/dL (ref 30.0–36.0)
MCV: 93 fL (ref 80.0–100.0)
Monocytes Absolute: 0.4 10*3/uL (ref 0.1–1.0)
Monocytes Relative: 8 %
Neutro Abs: 3.6 10*3/uL (ref 1.7–7.7)
Neutrophils Relative %: 70 %
Platelet Count: 133 10*3/uL — ABNORMAL LOW (ref 150–400)
RBC: 3.85 MIL/uL — ABNORMAL LOW (ref 4.22–5.81)
RDW: 15.3 % (ref 11.5–15.5)
WBC Count: 5.1 10*3/uL (ref 4.0–10.5)
nRBC: 0 % (ref 0.0–0.2)

## 2023-01-20 LAB — LACTATE DEHYDROGENASE: LDH: 178 U/L (ref 98–192)

## 2023-01-20 LAB — CMP (CANCER CENTER ONLY)
ALT: 12 U/L (ref 0–44)
AST: 20 U/L (ref 15–41)
Albumin: 3.9 g/dL (ref 3.5–5.0)
Alkaline Phosphatase: 48 U/L (ref 38–126)
Anion gap: 6 (ref 5–15)
BUN: 18 mg/dL (ref 8–23)
CO2: 31 mmol/L (ref 22–32)
Calcium: 9.5 mg/dL (ref 8.9–10.3)
Chloride: 103 mmol/L (ref 98–111)
Creatinine: 1.02 mg/dL (ref 0.61–1.24)
GFR, Estimated: >60 mL/min (ref >60)
Glucose, Bld: 95 mg/dL (ref 70–99)
Potassium: 4.6 mmol/L (ref 3.5–5.1)
Sodium: 140 mmol/L (ref 135–145)
Total Bilirubin: 0.9 mg/dL (ref 0.3–1.2)
Total Protein: 6.9 g/dL (ref 6.5–8.1)

## 2023-01-20 LAB — IRON AND IRON BINDING CAPACITY (CC-WL,HP ONLY)
Iron: 68 ug/dL (ref 45–182)
Saturation Ratios: 24 % (ref 17.9–39.5)
TIBC: 281 ug/dL (ref 250–450)
UIBC: 213 ug/dL (ref 117–376)

## 2023-01-20 LAB — FERRITIN: Ferritin: 169 ng/mL (ref 24–336)

## 2023-01-20 MED ORDER — LANREOTIDE ACETATE 120 MG/0.5ML ~~LOC~~ SOLN
120.0000 mg | Freq: Once | SUBCUTANEOUS | Status: AC
  Administered 2023-01-20: 120 mg via SUBCUTANEOUS
  Filled 2023-01-20: qty 120

## 2023-01-20 NOTE — Patient Instructions (Signed)
Lanreotide Injection What is this medication? LANREOTIDE (lan REE oh tide) treats high levels of growth hormone (acromegaly). It is used when other therapies have not worked well enough or cannot be tolerated. It works by reducing the amount of growth hormone your body makes. This reduces symptoms and the risk of health problems caused by too much growth hormone, such as diabetes and heart disease. It may also be used to treat neuroendocrine tumors, a cancer of the cells that release hormones and other substances in your body. It works by slowing down the release of these substances from the cells. This slows tumor growth. It also decreases the symptoms of carcinoid syndrome, such as flushing or diarrhea. This medicine may be used for other purposes; ask your health care provider or pharmacist if you have questions. COMMON BRAND NAME(S): Somatuline [place] What should I tell my care team before I take this medication? They need to know if you have any of these conditions: Diabetes Gallbladder disease Heart disease Kidney disease Liver disease Thyroid disease An unusual or allergic reaction to lanreotide, other medications, foods, dyes, or preservatives Pregnant or trying to get pregnant Breast-feeding How should I use this medication? This medication is injected under the skin. It is given by your care team in a hospital or clinic setting. Talk to your care team about the use of this medication in children. Special care may be needed. Overdosage: If you think you have taken too much of this medicine contact a poison control center or emergency room at once. NOTE: This medicine is only for you. Do not share this medicine with others. What if I miss a dose? Keep appointments for follow-up doses. It is important not to miss your dose. Call your care team if you are unable to keep an appointment. What may interact with this medication? Bromocriptine Cyclosporine Certain medications for blood  pressure, heart disease, irregular heartbeat Certain medications for diabetes Quinidine Terfenadine This list may not describe all possible interactions. Give your health care provider a list of all the medicines, herbs, non-prescription drugs, or dietary supplements you use. Also tell them if you smoke, drink alcohol, or use illegal drugs. Some items may interact with your medicine. What should I watch for while using this medication? Visit your care team for regular checks on your progress. Tell your care team if your symptoms do not start to get better or if they get worse. Your condition will be monitored carefully while you are receiving this medication. You may need blood work while you are taking this medication. This medication may increase blood sugar. The risk may be higher in patients who already have diabetes. Ask your care team what you can do to lower your risk of diabetes while taking this medication. Talk to your care team if you wish to become pregnant or think you may be pregnant. This medication can cause serious birth defects. Do not breast-feed while taking this medication and for 6 months after stopping therapy. This medication may cause infertility. Talk to your care team if you are concerned about your fertility. What side effects may I notice from receiving this medication? Side effects that you should report to your care team as soon as possible: Allergic reactions--skin rash, itching, hives, swelling of the face, lips, tongue, or throat Gallbladder problems--severe stomach pain, nausea, vomiting, fever High blood sugar (hyperglycemia)--increased thirst or amount of urine, unusual weakness or fatigue, blurry vision Increase in blood pressure Low blood sugar (hypoglycemia)--tremors or shaking, anxiety, sweating, cold   or clammy skin, confusion, dizziness, rapid heartbeat Low thyroid levels (hypothyroidism)--unusual weakness or fatigue, increased sensitivity to cold,  constipation, hair loss, dry skin, weight gain, feelings of depression Slow heartbeat--dizziness, feeling faint or lightheaded, confusion, trouble breathing, unusual weakness or fatigue Side effects that usually do not require medical attention (report to your care team if they continue or are bothersome): Diarrhea Dizziness Headache Muscle spasms Nausea Pain, redness, irritation, or bruising at the injection site Stomach pain This list may not describe all possible side effects. Call your doctor for medical advice about side effects. You may report side effects to FDA at 1-800-FDA-1088. Where should I keep my medication? This medication is given in a hospital or clinic. It will not be stored at home. NOTE: This sheet is a summary. It may not cover all possible information. If you have questions about this medicine, talk to your doctor, pharmacist, or health care provider.  2023 Elsevier/Gold Standard (2021-12-18 00:00:00)  

## 2023-01-20 NOTE — Progress Notes (Signed)
Hematology and Oncology Follow Up Visit  Brian Mays [id number] August 03, 1946 77 y.o. 01/20/2023   Principle Diagnosis:  Metastatic low grade neuroendocrine tumor-hepatic metastases Iron deficiency anemia  Past Diagnosis:  S/p yttrium-90 intrahepatic therapy - November 2016 Lutathera (Lu 177) injection on 02/01/2018 - s/p cycle 4 SBRT to T6 lesion -- 11/06/2021   Current Therapy:        Somatuline 120 mg monthly IV Iron as indicated  -- Injectafer given on 01/2021   Interim History:  Brian Mays is here today for his normal follow up and consideration of Somatuline.   Overall he is doing well. Still stressed- taking care of his wife. His PCP has suggested a medication to help "keep me calm". He is considering starting this. Also having hospice help with his wife has been good.   No fever, chills, n/v, cough, rash, dizziness, SOB, chest pain, palpitations, abdominal pain or changes in bowel or bladder habits.  No swelling or tenderness in his extremities.  He has had some improvement of his numbness and tingling in the left arm secondary to a pinched nerve- doing home PT  No falls or syncope reported.  Appetite comes and goes. He is doing his best to stay well hydrated.  Wt Readings from Last 3 Encounters:  01/20/23 182 lb 6.4 oz (82.7 kg)  12/16/22 185 lb (83.9 kg)  12/09/22 184 lb 3.2 oz (83.6 kg)    ECOG Performance Status: 1 - Symptomatic but completely ambulatory  Medications:  Allergies as of 01/20/2023       Reactions   Iohexol Hives    Code: HIVES, Desc: PER [name] @ PRIMARY CARE, PT IS ALLERGIC TO CONTRAST DYE 10/02/08/RM  05/01/10...needs full premeds per our protocol w/ [facility]., Onset Date: [id number]   Iodinated Contrast Media Hives        Medication List        Accurate as of January 20, 2023 11:18 AM. If you have any questions, ask your nurse or doctor.          aspirin EC 81 MG tablet Take 81 mg by mouth every morning.   atorvastatin 80 MG  tablet Commonly known as: LIPITOR TAKE 1 TABLET BY MOUTH EVERY DAY   Cholecalciferol 25 MCG (1000 UT) tablet Take 1,000 Units by mouth 2 (two) times daily.   clopidogrel 75 MG tablet Commonly known as: PLAVIX TAKE 1 TABLET BY MOUTH EVERY DAY   cyanocobalamin 1000 MCG tablet Commonly known as: VITAMIN B12 Take 1,000 mcg by mouth every other day.   diphenhydramine-acetaminophen 25-500 MG Tabs tablet Commonly known as: TYLENOL PM Take 1 tablet by mouth at bedtime as needed (sleep/pain).   fluticasone 50 MCG/ACT nasal spray Commonly known as: FLONASE Place 2 sprays into both nostrils daily as needed (sinuses).   hydrocortisone 25 MG suppository Commonly known as: ANUSOL-HC INSERT 1 SUPPOSITORY RECTALLY TWICE A DAY AS NEEDED FOR HEMORRHOIDS   ipratropium 0.03 % nasal spray Commonly known as: Atrovent Place 2 sprays into the nose 3 (three) times daily. Use as needed for nasal drip   lansoprazole 30 MG capsule Commonly known as: PREVACID TAKE 1 CAPSULE (30 MG TOTAL) BY MOUTH DAILY AT 12 NOON.   levothyroxine 50 MCG tablet Commonly known as: SYNTHROID TAKE 1 TABLET BY MOUTH EVERY DAY BEFORE BREAKFAST   losartan 50 MG tablet Commonly known as: COZAAR Take 1 tablet (50 mg total) by mouth daily.   metoprolol tartrate 25 MG tablet Commonly known as: LOPRESSOR Take 0.5 tablets (  12.5 mg total) by mouth 2 (two) times daily.   nitroGLYCERIN 0.4 MG SL tablet Commonly known as: NITROSTAT PLACE 1 TABLET UNDER THE TONGUE EVERY 5 (FIVE) MINUTES X 3 DOSES AS NEEDED FOR CHEST PAIN.   ranolazine 500 MG 12 hr tablet Commonly known as: RANEXA Take 1 tablet (500 mg total) by mouth 2 (two) times daily.   SOMATULINE DEPOT [place] Inject 120 mcg into the skin every 28 (twenty-eight) days. Receives at Dr Antonieta Pert office   SOOTHE XP OP Place 1 drop into both eyes daily as needed (dry eyes).        Allergies:  Allergies  Allergen Reactions   Iohexol Hives     Code: HIVES, Desc: PER  [name] @ PRIMARY CARE, PT IS ALLERGIC TO CONTRAST DYE 10/02/08/RM  05/01/10...needs full premeds per our protocol w/ [facility]., Onset Date: [id number]    Iodinated Contrast Media Hives    Past Medical History, Surgical history, Social history, and Family History were reviewed and updated.  Review of Systems: All other 10 point review of systems is negative.   Physical Exam:  height is 5\' 7"  (1.702 m) and weight is 182 lb 6.4 oz (82.7 kg). His oral temperature is 98.1 F (36.7 C). His blood pressure is 131/52 (abnormal) and his pulse is 56 (abnormal). His respiration is 18 and oxygen saturation is 100%.   Wt Readings from Last 3 Encounters:  01/20/23 182 lb 6.4 oz (82.7 kg)  12/16/22 185 lb (83.9 kg)  12/09/22 184 lb 3.2 oz (83.6 kg)    Ocular: Sclerae unicteric, pupils equal, round and reactive to light Ear-nose-throat: Oropharynx clear, dentition fair Lymphatic: right posterior cervical lymph has resolved Lungs no rales or rhonchi, good excursion bilaterally Heart regular rate and rhythm, no murmur appreciated Abd soft, nontender, positive bowel sounds MSK no focal spinal tenderness, no joint edema Neuro: non-focal, well-oriented, appropriate affect  Lab Results  Component Value Date   WBC 5.0 12/16/2022   HGB 10.6 (L) 12/16/2022   HCT 33.7 (L) 12/16/2022   MCV 94.1 12/16/2022   PLT 124 (L) 12/16/2022   Lab Results  Component Value Date   FERRITIN 50 12/16/2022   IRON 48 12/16/2022   TIBC 300 12/16/2022   UIBC 252 12/16/2022   IRONPCTSAT 16 (L) 12/16/2022   Lab Results  Component Value Date   RETICCTPCT 1.2 06/14/2022   RBC 3.58 (L) 12/16/2022   No results found for: "KPAFRELGTCHN", "LAMBDASER", "KAPLAMBRATIO" No results found for: "IGGSERUM", "IGA", "IGMSERUM" No results found for: "TOTALPROTELP", "ALBUMINELP", "A1GS", "A2GS", "BETS", "BETA2SER", "GAMS", "MSPIKE", "SPEI"   Chemistry      Component Value Date/Time   NA 141 12/16/2022 1052   NA 141 06/22/2022 0942    NA 144 10/04/2017 0904   NA 141 07/23/2016 1255   K 4.1 12/16/2022 1052   K 3.8 10/04/2017 0904   K 4.1 07/23/2016 1255   CL 104 12/16/2022 1052   CL 103 10/04/2017 0904   CO2 30 12/16/2022 1052   CO2 28 10/04/2017 0904   CO2 27 07/23/2016 1255   BUN 18 12/16/2022 1052   BUN 20 06/22/2022 0942   BUN 14 10/04/2017 0904   BUN 13.8 07/23/2016 1255   CREATININE 1.03 12/16/2022 1052   CREATININE 0.8 10/04/2017 0904   CREATININE 1.0 07/23/2016 1255      Component Value Date/Time   CALCIUM 9.3 12/16/2022 1052   CALCIUM 9.4 10/04/2017 0904   CALCIUM 9.2 07/23/2016 1255   ALKPHOS 49 12/16/2022 1052  ALKPHOS 64 10/04/2017 0904   ALKPHOS 77 07/23/2016 1255   AST 20 12/16/2022 1052   AST 23 07/23/2016 1255   ALT 11 12/16/2022 1052   ALT 22 10/04/2017 0904   ALT 13 07/23/2016 1255   BILITOT 0.8 12/16/2022 1052   BILITOT 1.13 07/23/2016 1255      Encounter Diagnoses  Name Primary?   Metastatic malignant neuroendocrine tumor to liver (HCC) Yes   Iron deficiency anemia due to chronic blood loss    Impression and Plan: Mr. Kumpe is a very pleasant 77 yo caucasian gentleman with metastatic low-grade neuroendocrine carcinoma with hepatic metastasis.    Chromogranin A lab pending today but previously 165.1 which is stable. Recent PET scan from 12/01/2022 which showed no hypermetabolic lesions. CBC stable with Hgb of 11.3  He will continue to follow up with Cardiology regarding with his HTN and bradycardia.   Continue PCP follow up  Disposition: Somatuline today RTC 1 month MD, labs, [place], [place] 3/21/202411:18 AM

## 2023-01-21 ENCOUNTER — Other Ambulatory Visit: Payer: Self-pay

## 2023-01-21 MED ORDER — RANOLAZINE ER 500 MG PO TB12: 500.0000 mg | ORAL_TABLET | Freq: Two times a day (BID) | ORAL | 1 refills | Status: DC

## 2023-01-21 NOTE — Telephone Encounter (Signed)
Pt's medication was sent to pt's pharmacy as requested. Confirmation received.  °

## 2023-01-24 ENCOUNTER — Other Ambulatory Visit: Payer: Self-pay | Admitting: *Deleted

## 2023-01-24 LAB — CHROMOGRANIN A: Chromogranin A (ng/mL): 137.9 ng/mL — ABNORMAL HIGH (ref 0.0–101.8)

## 2023-01-24 MED ORDER — RANOLAZINE ER 500 MG PO TB12: 500.0000 mg | ORAL_TABLET | Freq: Two times a day (BID) | ORAL | 1 refills | Status: DC

## 2023-02-14 ENCOUNTER — Telehealth: Payer: Self-pay | Admitting: Family Medicine

## 2023-02-14 NOTE — Telephone Encounter (Signed)
Copied from CRM #[id number]. Topic: Medicare AWV >> Feb 14, 2023 10:01 AM Payton Doughty wrote: Reason for CRM: Called patient to schedule Medicare Annual Wellness Visit (AWV). Left message for patient to call back and schedule Medicare Annual Wellness Visit (AWV).  Last date of AWV: 4/12/05/2021  Please schedule an appointment at any time with Donne Anon, CMA  .  If any questions, please contact me.  Thank you ,  Verlee Rossetti; Care Guide Ambulatory Clinical Support [place] [facility] Direct Dial: [phone]

## 2023-02-16 ENCOUNTER — Encounter: Payer: Self-pay | Admitting: Family

## 2023-02-16 ENCOUNTER — Inpatient Hospital Stay: Payer: Medicare HMO | Attending: Hematology & Oncology

## 2023-02-16 ENCOUNTER — Inpatient Hospital Stay: Payer: Medicare HMO

## 2023-02-16 ENCOUNTER — Inpatient Hospital Stay: Payer: Medicare HMO | Admitting: Family

## 2023-02-16 ENCOUNTER — Other Ambulatory Visit: Payer: Self-pay

## 2023-02-16 VITALS — BP 162/55 | HR 60 | Temp 98.0°F | Resp 19 | Ht 70.5 in | Wt 182.1 lb

## 2023-02-16 DIAGNOSIS — C7B8 Other secondary neuroendocrine tumors: Secondary | ICD-10-CM

## 2023-02-16 DIAGNOSIS — D509 Iron deficiency anemia, unspecified: Secondary | ICD-10-CM | POA: Diagnosis not present

## 2023-02-16 DIAGNOSIS — C7A8 Other malignant neuroendocrine tumors: Secondary | ICD-10-CM | POA: Diagnosis not present

## 2023-02-16 DIAGNOSIS — D500 Iron deficiency anemia secondary to blood loss (chronic): Secondary | ICD-10-CM

## 2023-02-16 LAB — CBC WITH DIFFERENTIAL (CANCER CENTER ONLY)
Abs Immature Granulocytes: 0.02 10*3/uL (ref 0.00–0.07)
Basophils Absolute: 0.1 10*3/uL (ref 0.0–0.1)
Basophils Relative: 1 %
Eosinophils Absolute: 0.4 10*3/uL (ref 0.0–0.5)
Eosinophils Relative: 7 %
HCT: 36.1 % — ABNORMAL LOW (ref 39.0–52.0)
Hemoglobin: 11.6 g/dL — ABNORMAL LOW (ref 13.0–17.0)
Immature Granulocytes: 0 %
Lymphocytes Relative: 19 %
Lymphs Abs: 1 10*3/uL (ref 0.7–4.0)
MCH: 29.9 pg (ref 26.0–34.0)
MCHC: 32.1 g/dL (ref 30.0–36.0)
MCV: 93 fL (ref 80.0–100.0)
Monocytes Absolute: 0.4 10*3/uL (ref 0.1–1.0)
Monocytes Relative: 7 %
Neutro Abs: 3.5 10*3/uL (ref 1.7–7.7)
Neutrophils Relative %: 66 %
Platelet Count: 121 10*3/uL — ABNORMAL LOW (ref 150–400)
RBC: 3.88 MIL/uL — ABNORMAL LOW (ref 4.22–5.81)
RDW: 15.2 % (ref 11.5–15.5)
WBC Count: 5.3 10*3/uL (ref 4.0–10.5)
nRBC: 0 % (ref 0.0–0.2)

## 2023-02-16 LAB — CMP (CANCER CENTER ONLY)
ALT: 13 U/L (ref 0–44)
AST: 21 U/L (ref 15–41)
Albumin: 3.9 g/dL (ref 3.5–5.0)
Alkaline Phosphatase: 53 U/L (ref 38–126)
Anion gap: 7 (ref 5–15)
BUN: 18 mg/dL (ref 8–23)
CO2: 31 mmol/L (ref 22–32)
Calcium: 9.3 mg/dL (ref 8.9–10.3)
Chloride: 104 mmol/L (ref 98–111)
Creatinine: 1.04 mg/dL (ref 0.61–1.24)
GFR, Estimated: >60 mL/min (ref >60)
Glucose, Bld: 142 mg/dL — ABNORMAL HIGH (ref 70–99)
Potassium: 4.6 mmol/L (ref 3.5–5.1)
Sodium: 142 mmol/L (ref 135–145)
Total Bilirubin: 0.8 mg/dL (ref 0.3–1.2)
Total Protein: 7.1 g/dL (ref 6.5–8.1)

## 2023-02-16 LAB — FERRITIN: Ferritin: 137 ng/mL (ref 24–336)

## 2023-02-16 MED ORDER — LANREOTIDE ACETATE 120 MG/0.5ML ~~LOC~~ SOLN
120.0000 mg | Freq: Once | SUBCUTANEOUS | Status: AC
  Administered 2023-02-16: 120 mg via SUBCUTANEOUS
  Filled 2023-02-16: qty 120

## 2023-02-16 NOTE — Patient Instructions (Signed)
Lanreotide Injection What is this medication? LANREOTIDE (lan REE oh tide) treats high levels of growth hormone (acromegaly). It is used when other therapies have not worked well enough or cannot be tolerated. It works by reducing the amount of growth hormone your body makes. This reduces symptoms and the risk of health problems caused by too much growth hormone, such as diabetes and heart disease. It may also be used to treat neuroendocrine tumors, a cancer of the cells that release hormones and other substances in your body. It works by slowing down the release of these substances from the cells. This slows tumor growth. It also decreases the symptoms of carcinoid syndrome, such as flushing or diarrhea. This medicine may be used for other purposes; ask your health care provider or pharmacist if you have questions. COMMON BRAND NAME(S): Somatuline [place] What should I tell my care team before I take this medication? They need to know if you have any of these conditions: Diabetes Gallbladder disease Heart disease Kidney disease Liver disease Thyroid disease An unusual or allergic reaction to lanreotide, other medications, foods, dyes, or preservatives Pregnant or trying to get pregnant Breast-feeding How should I use this medication? This medication is injected under the skin. It is given by your care team in a hospital or clinic setting. Talk to your care team about the use of this medication in children. Special care may be needed. Overdosage: If you think you have taken too much of this medicine contact a poison control center or emergency room at once. NOTE: This medicine is only for you. Do not share this medicine with others. What if I miss a dose? Keep appointments for follow-up doses. It is important not to miss your dose. Call your care team if you are unable to keep an appointment. What may interact with this medication? Bromocriptine Cyclosporine Certain medications for blood  pressure, heart disease, irregular heartbeat Certain medications for diabetes Quinidine Terfenadine This list may not describe all possible interactions. Give your health care provider a list of all the medicines, herbs, non-prescription drugs, or dietary supplements you use. Also tell them if you smoke, drink alcohol, or use illegal drugs. Some items may interact with your medicine. What should I watch for while using this medication? Visit your care team for regular checks on your progress. Tell your care team if your symptoms do not start to get better or if they get worse. Your condition will be monitored carefully while you are receiving this medication. You may need blood work while you are taking this medication. This medication may increase blood sugar. The risk may be higher in patients who already have diabetes. Ask your care team what you can do to lower your risk of diabetes while taking this medication. Talk to your care team if you wish to become pregnant or think you may be pregnant. This medication can cause serious birth defects. Do not breast-feed while taking this medication and for 6 months after stopping therapy. This medication may cause infertility. Talk to your care team if you are concerned about your fertility. What side effects may I notice from receiving this medication? Side effects that you should report to your care team as soon as possible: Allergic reactions--skin rash, itching, hives, swelling of the face, lips, tongue, or throat Gallbladder problems--severe stomach pain, nausea, vomiting, fever High blood sugar (hyperglycemia)--increased thirst or amount of urine, unusual weakness or fatigue, blurry vision Increase in blood pressure Low blood sugar (hypoglycemia)--tremors or shaking, anxiety, sweating, cold   or clammy skin, confusion, dizziness, rapid heartbeat Low thyroid levels (hypothyroidism)--unusual weakness or fatigue, increased sensitivity to cold,  constipation, hair loss, dry skin, weight gain, feelings of depression Slow heartbeat--dizziness, feeling faint or lightheaded, confusion, trouble breathing, unusual weakness or fatigue Side effects that usually do not require medical attention (report to your care team if they continue or are bothersome): Diarrhea Dizziness Headache Muscle spasms Nausea Pain, redness, irritation, or bruising at the injection site Stomach pain This list may not describe all possible side effects. Call your doctor for medical advice about side effects. You may report side effects to FDA at 1-800-FDA-1088. Where should I keep my medication? This medication is given in a hospital or clinic. It will not be stored at home. NOTE: This sheet is a summary. It may not cover all possible information. If you have questions about this medicine, talk to your doctor, pharmacist, or health care provider.  2023 Elsevier/Gold Standard (2021-12-18 00:00:00)  

## 2023-02-16 NOTE — Progress Notes (Signed)
Hematology and Oncology Follow Up Visit  Brian Mays [id number] 03-18-1946 77 y.o. 02/16/2023   Principle Diagnosis:  Metastatic low grade neuroendocrine tumor-hepatic metastases Iron deficiency anemia   Past Diagnosis:  S/p yttrium-90 intrahepatic therapy - November 2016 Lutathera (Lu 177) injection on 02/01/2018 - s/p cycle 4 SBRT to T6 lesion -- 11/06/2021   Current Therapy:        Somatuline 120 mg monthly IV Iron as indicated  -- Injectafer given on 01/2021   Interim History:  Brian Mays is here today for follow-up and treatment. He is doing well but still notes occasional dizziness. He states that this has happened while driving so he has cut his metoprolol dose in half. This seems to have helped.  Chromogranin A last month was stable at 137.  No fever, chills, n/v, cough, rash, dizziness, SOB, chest pain, palpitations, abdominal pain or changes in bowel or bladder habits.  No swelling, tenderness, numbness or tingling in his extremities.  No falls or syncope.  Appetite and hydration are good. Weight is stable at 182 lbs.   ECOG Performance Status: 1 - Symptomatic but completely ambulatory  Medications:  Allergies as of 02/16/2023       Reactions   Iohexol Hives    Code: HIVES, Desc: PER [name] @ PRIMARY CARE, PT IS ALLERGIC TO CONTRAST DYE 10/02/08/RM  05/01/10...needs full premeds per our protocol w/ [facility]., Onset Date: [id number]   Iodinated Contrast Media Hives        Medication List        Accurate as of February 16, 2023  1:56 PM. If you have any questions, ask your nurse or doctor.          aspirin EC 81 MG tablet Take 81 mg by mouth every morning.   atorvastatin 80 MG tablet Commonly known as: LIPITOR TAKE 1 TABLET BY MOUTH EVERY DAY   Cholecalciferol 25 MCG (1000 UT) tablet Take 1,000 Units by mouth 2 (two) times daily.   clopidogrel 75 MG tablet Commonly known as: PLAVIX TAKE 1 TABLET BY MOUTH EVERY DAY   cyanocobalamin 1000 MCG  tablet Commonly known as: VITAMIN B12 Take 1,000 mcg by mouth every other day.   diphenhydramine-acetaminophen 25-500 MG Tabs tablet Commonly known as: TYLENOL PM Take 1 tablet by mouth at bedtime as needed (sleep/pain).   fluticasone 50 MCG/ACT nasal spray Commonly known as: FLONASE Place 2 sprays into both nostrils daily as needed (sinuses).   hydrocortisone 25 MG suppository Commonly known as: ANUSOL-HC INSERT 1 SUPPOSITORY RECTALLY TWICE A DAY AS NEEDED FOR HEMORRHOIDS   ipratropium 0.03 % nasal spray Commonly known as: Atrovent Place 2 sprays into the nose 3 (three) times daily. Use as needed for nasal drip   lansoprazole 30 MG capsule Commonly known as: PREVACID TAKE 1 CAPSULE (30 MG TOTAL) BY MOUTH DAILY AT 12 NOON.   levothyroxine 50 MCG tablet Commonly known as: SYNTHROID TAKE 1 TABLET BY MOUTH EVERY DAY BEFORE BREAKFAST   losartan 50 MG tablet Commonly known as: COZAAR Take 1 tablet (50 mg total) by mouth daily.   metoprolol tartrate 25 MG tablet Commonly known as: LOPRESSOR Take 0.5 tablets (12.5 mg total) by mouth 2 (two) times daily.   nitroGLYCERIN 0.4 MG SL tablet Commonly known as: NITROSTAT PLACE 1 TABLET UNDER THE TONGUE EVERY 5 (FIVE) MINUTES X 3 DOSES AS NEEDED FOR CHEST PAIN.   ranolazine 500 MG 12 hr tablet Commonly known as: RANEXA Take 1 tablet (500 mg total) by mouth  2 (two) times daily.   SOMATULINE DEPOT [place] Inject 120 mcg into the skin every 28 (twenty-eight) days. Receives at Dr Gustavo Lah office   SOOTHE XP OP Place 1 drop into both eyes daily as needed (dry eyes).        Allergies:  Allergies  Allergen Reactions   Iohexol Hives     Code: HIVES, Desc: PER [name] @ [facility]IMARY [facility], PT IS ALLERGIC TO CONTRAST DYE 10/02/08/RM  05/01/10...needs full premeds per our protocol w/ [facility]., Onset Date: [id number]    Iodinated Contrast Media Hives    Past Medical History, Surgical history, Social history, and Family History were reviewed and  updated.  Review of Systems: All other 10 point review of systems is negative.   Physical Exam:  vitals were not taken for this visit.   Wt Readings from Last 3 Encounters:  01/20/23 182 lb 6.4 oz (82.7 kg)  12/16/22 185 lb (83.9 kg)  12/09/22 184 lb 3.2 oz (83.6 kg)    Ocular: Sclerae unicteric, pupils equal, round and reactive to light Ear-nose-throat: Oropharynx clear, dentition fair Lymphatic: No cervical or supraclavicular adenopathy Lungs no rales or rhonchi, good excursion bilaterally Heart regular rate and rhythm, no murmur appreciated Abd soft, nontender, positive bowel sounds MSK no focal spinal tenderness, no joint edema Neuro: non-focal, well-oriented, appropriate affect Breasts: Deferred   Lab Results  Component Value Date   WBC 5.1 01/20/2023   HGB 11.3 (L) 01/20/2023   HCT 35.8 (L) 01/20/2023   MCV 93.0 01/20/2023   PLT 133 (L) 01/20/2023   Lab Results  Component Value Date   FERRITIN 169 01/20/2023   IRON 68 01/20/2023   TIBC 281 01/20/2023   UIBC 213 01/20/2023   IRONPCTSAT 24 01/20/2023   Lab Results  Component Value Date   RETICCTPCT 1.2 06/14/2022   RBC 3.85 (L) 01/20/2023   No results found for: "KPAFRELGTCHN", "LAMBDASER", "KAPLAMBRATIO" No results found for: "IGGSERUM", "IGA", "IGMSERUM" No results found for: "TOTALPROTELP", "ALBUMINELP", "A1GS", "A2GS", "BETS", "BETA2SER", "GAMS", "MSPIKE", "SPEI"   Chemistry      Component Value Date/Time   NA 140 01/20/2023 1029   NA 141 06/22/2022 0942   NA 144 10/04/2017 0904   NA 141 07/23/2016 1255   K 4.6 01/20/2023 1029   K 3.8 10/04/2017 0904   K 4.1 07/23/2016 1255   CL 103 01/20/2023 1029   CL 103 10/04/2017 0904   CO2 31 01/20/2023 1029   CO2 28 10/04/2017 0904   CO2 27 07/23/2016 1255   BUN 18 01/20/2023 1029   BUN 20 06/22/2022 0942   BUN 14 10/04/2017 0904   BUN 13.8 07/23/2016 1255   CREATININE 1.02 01/20/2023 1029   CREATININE 0.8 10/04/2017 0904   CREATININE 1.0 07/23/2016  1255      Component Value Date/Time   CALCIUM 9.5 01/20/2023 1029   CALCIUM 9.4 10/04/2017 0904   CALCIUM 9.2 07/23/2016 1255   ALKPHOS 48 01/20/2023 1029   ALKPHOS 64 10/04/2017 0904   ALKPHOS 77 07/23/2016 1255   AST 20 01/20/2023 1029   AST 23 07/23/2016 1255   ALT 12 01/20/2023 1029   ALT 22 10/04/2017 0904   ALT 13 07/23/2016 1255   BILITOT 0.9 01/20/2023 1029   BILITOT 1.13 07/23/2016 1255       Impression and Plan: Brian Mays is a very pleasant 77 yo caucasian gentleman with metastatic low-grade neuroendocrine carcinoma with hepatic metastasis.    We will proceed with Somatuline injection today as planned.  Chromogranin  A pending.  Follow-up in 1 month.   Eileen Stanford, NP 4/17/20241:56 PM

## 2023-02-17 ENCOUNTER — Ambulatory Visit (INDEPENDENT_AMBULATORY_CARE_PROVIDER_SITE_OTHER): Payer: Medicare HMO | Admitting: Family Medicine

## 2023-02-17 VITALS — BP 124/60 | HR 65 | Temp 97.8°F | Resp 18 | Ht 70.0 in | Wt 182.8 lb

## 2023-02-17 DIAGNOSIS — B070 Plantar wart: Secondary | ICD-10-CM

## 2023-02-17 DIAGNOSIS — B079 Viral wart, unspecified: Secondary | ICD-10-CM

## 2023-02-17 LAB — IRON AND IRON BINDING CAPACITY (CC-WL,HP ONLY)
Iron: 69 ug/dL (ref 45–182)
Saturation Ratios: 24 % (ref 17.9–39.5)
TIBC: 291 ug/dL (ref 250–450)
UIBC: 222 ug/dL (ref 117–376)

## 2023-02-17 NOTE — Progress Notes (Signed)
Healthcare at [facility] [address] [place], [place] [address] [phone]  Date:  02/17/2023   Name:  Brian Mays   DOB:  [birth year]   MRN:  [id number]  PCP:  Pearline Cables, MD    Chief Complaint: Plantar Warts (Pt says this has been an issue before. R foot. )   History of Present Illness:  Brian Mays is a 77 y.o. very pleasant male patient who presents with the following:  Patient seen today to follow-up plantar warts on his right foot, also a wart to his right thumb- history of metastatic malignant neuroendocrine tumor in the liver/carcinoid syndrome status post partial small bowel resection in 2010, as well as iron malabsorption, CAD status post stenting in 2017, hypothyroidism, hypertension, chronic pancreatitis    Most recent visit with myself was in February of this year At that time his wife was on hospice with her brain tumor, doing poorly- she continues to decline and is now sleeping most of the time   In February Dontarius complained of tenderness on the plantar aspect of his right foot-we noted plantar warts which I treated with cryotherapy He notes improvement of his warts, he has also been treating him at home with debridement.  He would like to freeze again today if possible He states he has no other concerns for me today  Patient Active Problem List   Diagnosis Date Noted   Secondary malignant neoplasm of bone 02/08/2022   Prediabetes 09/03/2021   Near syncope 04/22/2020   Iron deficiency anemia due to chronic blood loss 03/29/2019   Iron malabsorption 03/29/2019   Lumbar radiculopathy 08/16/2017   CAD (coronary artery disease) 09/21/2016   Unstable angina 02/09/2016   Vitamin D deficiency 03/11/2015   Metastatic malignant neuroendocrine tumor to liver 02/14/2015   Hyperglycemia 04/03/2012   Diarrhea 03/29/2012   B12 deficiency 04/19/2011   Antiplatelet or antithrombotic long-term use 03/30/2011    TOBACCO USE, QUIT 10/13/2009   Unspecified vitamin D deficiency 06/19/2009   Neoplasm by body site 12/23/2008   DIVERTICULOSIS, COLON 11/08/2008   ABDOMINAL PAIN 10/02/2008   Hypothyroidism 12/13/2007   HEMORRHOIDS, NOS 12/13/2007   Belching 12/13/2007   COLONIC POLYPS, HX OF 12/13/2007   Essential hypertension 09/15/2007   Gastroesophageal reflux disease 09/15/2007   PANCREATITIS, CHRONIC 09/15/2007   CARDIAC MURMUR 09/15/2007   SNORING 09/15/2007    Past Medical History:  Diagnosis Date   Anemia    in past   Cataract    Colon polyps    Diverticulosis    Gallstones    GERD (gastroesophageal reflux disease)    Heart attack    mild, Spring 2017   Heart murmur    Hemorrhoid    Hiatal hernia    HTN (hypertension)    Hyperlipidemia    Hypothyroidism    Iron deficiency anemia due to chronic blood loss 03/29/2019   Iron malabsorption 03/29/2019   Metastatic carcinoma 2010   Dr Twanna Hy   Neuroendocrine cancer 08/2021   Pancreatitis 1998   chronic    Past Surgical History:  Procedure Laterality Date   APPENDECTOMY  1962   CARDIAC CATHETERIZATION N/A 02/10/2016   Procedure: Left Heart Cath and Coronary Angiography;  Surgeon: Yates Decamp, MD;  Location: [facility] INVASIVE CV LAB;  Service: Cardiovascular;  Laterality: N/A;   CARDIAC CATHETERIZATION  02/10/2016   Procedure: Coronary/Graft Atherectomy;  Surgeon: Yates Decamp, MD;  Location: [facility] INVASIVE CV LAB;  Service: Cardiovascular;;   CARDIAC CATHETERIZATION  02/10/2016   Procedure: Coronary Stent Intervention;  Surgeon: Yates Decamp, MD;  Location: [facility] INVASIVE CV LAB;  Service: Cardiovascular;;   CATARACT EXTRACTION W/ INTRAOCULAR LENS  IMPLANT, BILATERAL Bilateral    CORONARY STENT INTERVENTION N/A 12/16/2021   Procedure: CORONARY STENT INTERVENTION;  Surgeon: Orbie Pyo, MD;  Location: [facility] INVASIVE CV LAB;  Service: Cardiovascular;  Laterality: N/A;   IR GENERIC HISTORICAL  12/30/2015   IR RADIOLOGIST EVAL & MGMT 12/30/2015 Irish Lack, MD [facility] INTERV RAD   IR GENERIC HISTORICAL  11/09/2016   IR RADIOLOGIST EVAL & MGMT 11/09/2016 Irish Lack, MD [facility] INTERV RAD   IR RADIOLOGIST EVAL & MGMT  03/01/2017   IR RADIOLOGIST EVAL & MGMT  06/29/2017   LAPAROSCOPIC CHOLECYSTECTOMY  1999   LEFT HEART CATH AND CORONARY ANGIOGRAPHY N/A 12/16/2021   Procedure: LEFT HEART CATH AND CORONARY ANGIOGRAPHY;  Surgeon: Orbie Pyo, MD;  Location: [facility] INVASIVE CV LAB;  Service: Cardiovascular;  Laterality: N/A;   LIVER BIOPSY  2010   RADIOACTIVE SEED IMPLANT  X 3   "to my liver"   TUMOR EXCISION  01/2009   Carcinoil Resection    TUMOR REMOVAL     from small intestine    Social History   Tobacco Use   Smoking status: Former    Packs/day: 1.50    Years: 35.00    Additional pack years: 0.00    Total pack years: 52.50    Types: Cigarettes    Start date: 09/25/1959    Quit date: 12/21/1997    Years since quitting: 25.1   Smokeless tobacco: Never  Vaping Use   Vaping Use: Never used  Substance Use Topics   Alcohol use: Yes    Alcohol/week: 3.0 - 4.0 standard drinks of alcohol    Types: 3 - 4 Cans of beer per week    Comment: 4 beers weekly    Drug use: No    Family History  Problem Relation Age of Onset   Kidney disease Mother    Hyperlipidemia Mother    Hypertension Mother    COPD Father    Ulcerative colitis Daughter    Colon cancer Neg Hx    Esophageal cancer Neg Hx    Rectal cancer Neg Hx    Stomach cancer Neg Hx     Allergies  Allergen Reactions   Iohexol Hives     Code: HIVES, Desc: PER [name] @ PRIMARY CARE, PT IS ALLERGIC TO CONTRAST DYE 10/02/08/RM  05/01/10...needs full premeds per our protocol w/ [facility]., Onset Date: [id number]    Iodinated Contrast Media Hives    Medication list has been reviewed and updated.  Current Outpatient Medications on File Prior to Visit  Medication Sig Dispense Refill   Artificial Tear Solution (SOOTHE XP OP) Place 1 drop into both eyes daily as needed (dry eyes).      aspirin EC 81 MG tablet Take 81 mg by mouth every morning.     atorvastatin (LIPITOR) 80 MG tablet TAKE 1 TABLET BY MOUTH EVERY DAY 90 tablet 1   Cholecalciferol 1000 UNITS tablet Take 1,000 Units by mouth 2 (two) times daily.     clopidogrel (PLAVIX) 75 MG tablet TAKE 1 TABLET BY MOUTH EVERY DAY 90 tablet 2   diphenhydramine-acetaminophen (TYLENOL PM) 25-500 MG TABS tablet Take 1 tablet by mouth at bedtime as needed (sleep/pain).     fluticasone (FLONASE) 50 MCG/ACT nasal spray Place 2 sprays into both  nostrils daily as needed (sinuses).  11   hydrocortisone (ANUSOL-HC) 25 MG suppository INSERT 1 SUPPOSITORY RECTALLY TWICE A DAY AS NEEDED FOR HEMORRHOIDS 30 suppository 0   ipratropium (ATROVENT) 0.03 % nasal spray Place 2 sprays into the nose 3 (three) times daily. Use as needed for nasal drip 30 mL 6   Lanreotide Acetate (SOMATULINE DEPOT [place]) Inject 120 mcg into the skin every 28 (twenty-eight) days. Receives at Dr Gustavo Lah office     lansoprazole (PREVACID) 30 MG capsule TAKE 1 CAPSULE (30 MG TOTAL) BY MOUTH DAILY AT 12 NOON. 90 capsule 3   levothyroxine (SYNTHROID) 50 MCG tablet TAKE 1 TABLET BY MOUTH EVERY DAY BEFORE BREAKFAST 90 tablet 1   losartan (COZAAR) 50 MG tablet Take 1 tablet (50 mg total) by mouth daily. 90 tablet 3   metoprolol tartrate (LOPRESSOR) 25 MG tablet Take 0.5 tablets (12.5 mg total) by mouth 2 (two) times daily. 90 tablet 3   nitroGLYCERIN (NITROSTAT) 0.4 MG SL tablet PLACE 1 TABLET UNDER THE TONGUE EVERY 5 (FIVE) MINUTES X 3 DOSES AS NEEDED FOR CHEST PAIN. 25 tablet 4   ranolazine (RANEXA) 500 MG 12 hr tablet Take 1 tablet (500 mg total) by mouth 2 (two) times daily. 180 tablet 1   vitamin B-12 (CYANOCOBALAMIN) 1000 MCG tablet Take 1,000 mcg by mouth every other day.     No current facility-administered medications on file prior to visit.    Review of Systems:  As per HPI- otherwise negative.   Physical Examination: Vitals:   02/17/23 1514  BP: 124/60   Pulse: 65  Resp: 18  Temp: 97.8 F (36.6 C)  SpO2: 97%   Vitals:   02/17/23 1514  Weight: 182 lb 12.8 oz (82.9 kg)  Height:  (1.778 m)   Body mass index is 26.23 kg/m. Ideal Body Weight: Weight in (lb) to have BMI = 25: 173.9 GEN: No acute distress; alert,appropriate.  Looks well, his normal self PULM: Breathing comfortably in no respiratory distress PSYCH: Normally interactive.  Definitely 2, possibly 3 plantar warts on the plantar surface of the right foot.  He also has a small wart at the IP joint of the right thumb.  Verbal consent obtained.  All of the above described areas are treated with cryotherapy x 3 cycles.  Patient tolerated procedure well with no immediate complications   Assessment and Plan: Plantar warts Cryotherapy as above Advised we can retreat in a month or so as needed  Signed Abbe Amsterdam, MD

## 2023-02-17 NOTE — Patient Instructions (Signed)
It was good to see you again today!   

## 2023-02-18 LAB — CHROMOGRANIN A: Chromogranin A (ng/mL): 169.8 ng/mL — ABNORMAL HIGH (ref 0.0–101.8)

## 2023-02-27 ENCOUNTER — Other Ambulatory Visit: Payer: Self-pay | Admitting: Physician Assistant

## 2023-02-27 DIAGNOSIS — I10 Essential (primary) hypertension: Secondary | ICD-10-CM

## 2023-03-01 ENCOUNTER — Other Ambulatory Visit: Payer: Self-pay | Admitting: Family Medicine

## 2023-03-01 DIAGNOSIS — J3489 Other specified disorders of nose and nasal sinuses: Secondary | ICD-10-CM

## 2023-03-16 ENCOUNTER — Ambulatory Visit (HOSPITAL_COMMUNITY)
Admission: RE | Admit: 2023-03-16 | Discharge: 2023-03-16 | Disposition: A | Discharge status: Home / Self Care | Payer: Medicare HMO | Source: Ambulatory Visit | Attending: Radiation Oncology | Admitting: Radiation Oncology

## 2023-03-16 DIAGNOSIS — C7951 Secondary malignant neoplasm of bone: Secondary | ICD-10-CM | POA: Insufficient documentation

## 2023-03-16 MED ORDER — GADOBUTROL 1 MMOL/ML IV SOLN
8.0000 mL | Freq: Once | INTRAVENOUS | Status: AC | PRN
  Administered 2023-03-16: 8 mL via INTRAVENOUS

## 2023-03-18 ENCOUNTER — Inpatient Hospital Stay (HOSPITAL_BASED_OUTPATIENT_CLINIC_OR_DEPARTMENT_OTHER): Payer: Medicare HMO | Admitting: Family

## 2023-03-18 ENCOUNTER — Inpatient Hospital Stay: Payer: Medicare HMO | Attending: Hematology & Oncology

## 2023-03-18 ENCOUNTER — Encounter: Payer: Self-pay | Admitting: Family

## 2023-03-18 ENCOUNTER — Inpatient Hospital Stay: Payer: Medicare HMO

## 2023-03-18 VITALS — BP 138/68 | HR 64 | Temp 98.4°F | Resp 17 | Wt 183.1 lb

## 2023-03-18 DIAGNOSIS — C7B8 Other secondary neuroendocrine tumors: Secondary | ICD-10-CM | POA: Insufficient documentation

## 2023-03-18 DIAGNOSIS — C7A8 Other malignant neuroendocrine tumors: Secondary | ICD-10-CM | POA: Insufficient documentation

## 2023-03-18 DIAGNOSIS — D509 Iron deficiency anemia, unspecified: Secondary | ICD-10-CM | POA: Insufficient documentation

## 2023-03-18 DIAGNOSIS — D500 Iron deficiency anemia secondary to blood loss (chronic): Secondary | ICD-10-CM

## 2023-03-18 LAB — CBC WITH DIFFERENTIAL (CANCER CENTER ONLY)
Abs Immature Granulocytes: 0.02 10*3/uL (ref 0.00–0.07)
Basophils Absolute: 0 10*3/uL (ref 0.0–0.1)
Basophils Relative: 1 %
Eosinophils Absolute: 0.3 10*3/uL (ref 0.0–0.5)
Eosinophils Relative: 6 %
HCT: 36 % — ABNORMAL LOW (ref 39.0–52.0)
Hemoglobin: 11.5 g/dL — ABNORMAL LOW (ref 13.0–17.0)
Immature Granulocytes: 0 %
Lymphocytes Relative: 18 %
Lymphs Abs: 1 10*3/uL (ref 0.7–4.0)
MCH: 29.6 pg (ref 26.0–34.0)
MCHC: 31.9 g/dL (ref 30.0–36.0)
MCV: 92.8 fL (ref 80.0–100.0)
Monocytes Absolute: 0.4 10*3/uL (ref 0.1–1.0)
Monocytes Relative: 7 %
Neutro Abs: 3.6 10*3/uL (ref 1.7–7.7)
Neutrophils Relative %: 68 %
Platelet Count: 133 10*3/uL — ABNORMAL LOW (ref 150–400)
RBC: 3.88 MIL/uL — ABNORMAL LOW (ref 4.22–5.81)
RDW: 15.5 % (ref 11.5–15.5)
WBC Count: 5.3 10*3/uL (ref 4.0–10.5)
nRBC: 0 % (ref 0.0–0.2)

## 2023-03-18 LAB — CMP (CANCER CENTER ONLY)
ALT: 12 U/L (ref 0–44)
AST: 21 U/L (ref 15–41)
Albumin: 4.1 g/dL (ref 3.5–5.0)
Alkaline Phosphatase: 51 U/L (ref 38–126)
Anion gap: 6 (ref 5–15)
BUN: 15 mg/dL (ref 8–23)
CO2: 30 mmol/L (ref 22–32)
Calcium: 9.8 mg/dL (ref 8.9–10.3)
Chloride: 104 mmol/L (ref 98–111)
Creatinine: 0.92 mg/dL (ref 0.61–1.24)
GFR, Estimated: >60 mL/min (ref >60)
Glucose, Bld: 113 mg/dL — ABNORMAL HIGH (ref 70–99)
Potassium: 4.1 mmol/L (ref 3.5–5.1)
Sodium: 140 mmol/L (ref 135–145)
Total Bilirubin: 0.7 mg/dL (ref 0.3–1.2)
Total Protein: 7.1 g/dL (ref 6.5–8.1)

## 2023-03-18 LAB — FERRITIN: Ferritin: 119 ng/mL (ref 24–336)

## 2023-03-18 LAB — IRON AND IRON BINDING CAPACITY (CC-WL,HP ONLY)
Iron: 62 ug/dL (ref 45–182)
Saturation Ratios: 20 % (ref 17.9–39.5)
TIBC: 304 ug/dL (ref 250–450)
UIBC: 242 ug/dL (ref 117–376)

## 2023-03-18 MED ORDER — LANREOTIDE ACETATE 120 MG/0.5ML ~~LOC~~ SOLN
120.0000 mg | Freq: Once | SUBCUTANEOUS | Status: AC
  Administered 2023-03-18: 120 mg via SUBCUTANEOUS
  Filled 2023-03-18: qty 120

## 2023-03-18 NOTE — Patient Instructions (Signed)
Lanreotide Injection What is this medication? LANREOTIDE (lan REE oh tide) treats high levels of growth hormone (acromegaly). It is used when other therapies have not worked well enough or cannot be tolerated. It works by reducing the amount of growth hormone your body makes. This reduces symptoms and the risk of health problems caused by too much growth hormone, such as diabetes and heart disease. It may also be used to treat neuroendocrine tumors, a cancer of the cells that release hormones and other substances in your body. It works by slowing down the release of these substances from the cells. This slows tumor growth. It also decreases the symptoms of carcinoid syndrome, such as flushing or diarrhea. This medicine may be used for other purposes; ask your health care provider or pharmacist if you have questions. COMMON BRAND NAME(S): Somatuline [place] What should I tell my care team before I take this medication? They need to know if you have any of these conditions: Diabetes Gallbladder disease Heart disease Kidney disease Liver disease Thyroid disease An unusual or allergic reaction to lanreotide, other medications, foods, dyes, or preservatives Pregnant or trying to get pregnant Breast-feeding How should I use this medication? This medication is injected under the skin. It is given by your care team in a hospital or clinic setting. Talk to your care team about the use of this medication in children. Special care may be needed. Overdosage: If you think you have taken too much of this medicine contact a poison control center or emergency room at once. NOTE: This medicine is only for you. Do not share this medicine with others. What if I miss a dose? Keep appointments for follow-up doses. It is important not to miss your dose. Call your care team if you are unable to keep an appointment. What may interact with this medication? Bromocriptine Cyclosporine Certain medications for blood  pressure, heart disease, irregular heartbeat Certain medications for diabetes Quinidine Terfenadine This list may not describe all possible interactions. Give your health care provider a list of all the medicines, herbs, non-prescription drugs, or dietary supplements you use. Also tell them if you smoke, drink alcohol, or use illegal drugs. Some items may interact with your medicine. What should I watch for while using this medication? Visit your care team for regular checks on your progress. Tell your care team if your symptoms do not start to get better or if they get worse. Your condition will be monitored carefully while you are receiving this medication. You may need blood work while you are taking this medication. This medication may increase blood sugar. The risk may be higher in patients who already have diabetes. Ask your care team what you can do to lower your risk of diabetes while taking this medication. Talk to your care team if you wish to become pregnant or think you may be pregnant. This medication can cause serious birth defects. Do not breast-feed while taking this medication and for 6 months after stopping therapy. This medication may cause infertility. Talk to your care team if you are concerned about your fertility. What side effects may I notice from receiving this medication? Side effects that you should report to your care team as soon as possible: Allergic reactions--skin rash, itching, hives, swelling of the face, lips, tongue, or throat Gallbladder problems--severe stomach pain, nausea, vomiting, fever High blood sugar (hyperglycemia)--increased thirst or amount of urine, unusual weakness or fatigue, blurry vision Increase in blood pressure Low blood sugar (hypoglycemia)--tremors or shaking, anxiety, sweating, cold   or clammy skin, confusion, dizziness, rapid heartbeat Low thyroid levels (hypothyroidism)--unusual weakness or fatigue, increased sensitivity to cold,  constipation, hair loss, dry skin, weight gain, feelings of depression Slow heartbeat--dizziness, feeling faint or lightheaded, confusion, trouble breathing, unusual weakness or fatigue Side effects that usually do not require medical attention (report to your care team if they continue or are bothersome): Diarrhea Dizziness Headache Muscle spasms Nausea Pain, redness, irritation, or bruising at the injection site Stomach pain This list may not describe all possible side effects. Call your doctor for medical advice about side effects. You may report side effects to FDA at 1-800-FDA-1088. Where should I keep my medication? This medication is given in a hospital or clinic. It will not be stored at home. NOTE: This sheet is a summary. It may not cover all possible information. If you have questions about this medicine, talk to your doctor, pharmacist, or health care provider.  2023 Elsevier/Gold Standard (2021-12-18 00:00:00)  

## 2023-03-18 NOTE — Progress Notes (Signed)
Hematology and Oncology Follow Up Visit  Brian Mays [id number] May 01, 1946 77 y.o. 03/18/2023   Principle Diagnosis:  Metastatic low grade neuroendocrine tumor-hepatic metastases Iron deficiency anemia   Past Diagnosis:  S/p yttrium-90 intrahepatic therapy - November 2016 Lutathera (Lu 177) injection on 02/01/2018 - s/p cycle 4 SBRT to T6 lesion -- 11/06/2021   Current Therapy:        Somatuline 120 mg monthly IV Iron as indicated  -- Injectafer given on 01/2021   Interim History:  Brian Mays is here today for follow-up and treatment. His sweet wife passed away earlier this month and he is adjusting to this big life change. His daughter lives close by and is checking in on him regularly.  His appetite and hydration are good. Weight is stable at 183 lbs.  Chromogranin A level was 169 at his last visit.  No issue with infection. No fever, chills, n/v, cough, rash, dizziness, SOB, chest pain, palpitations, abdominal pain or changes in bowel or bladder habits.  No swelling, tenderness, numbness or tingling in his extremities.  He is hoping to start playing golf again and walking regularly next week.  No falls or syncope reported.   ECOG Performance Status: 1 - Symptomatic but completely ambulatory  Medications:  Allergies as of 03/18/2023       Reactions   Iohexol Hives    Code: HIVES, Desc: PER [name] @ PRIMARY CARE, PT IS ALLERGIC TO CONTRAST DYE 10/02/08/RM  05/01/10...needs full premeds per our protocol w/ [facility]., Onset Date: [id number]   Iodinated Contrast Media Hives        Medication List        Accurate as of Mar 18, 2023  2:14 PM. If you have any questions, ask your nurse or doctor.          aspirin EC 81 MG tablet Take 81 mg by mouth every morning.   atorvastatin 80 MG tablet Commonly known as: LIPITOR TAKE 1 TABLET BY MOUTH EVERY DAY   Cholecalciferol 25 MCG (1000 UT) tablet Take 1,000 Units by mouth 2 (two) times daily.   clopidogrel 75 MG  tablet Commonly known as: PLAVIX TAKE 1 TABLET BY MOUTH EVERY DAY   cyanocobalamin 1000 MCG tablet Commonly known as: VITAMIN B12 Take 1,000 mcg by mouth every other day.   diphenhydramine-acetaminophen 25-500 MG Tabs tablet Commonly known as: TYLENOL PM Take 1 tablet by mouth at bedtime as needed (sleep/pain).   fluticasone 50 MCG/ACT nasal spray Commonly known as: FLONASE Place 2 sprays into both nostrils daily as needed (sinuses).   hydrocortisone 25 MG suppository Commonly known as: ANUSOL-HC INSERT 1 SUPPOSITORY RECTALLY TWICE A DAY AS NEEDED FOR HEMORRHOIDS   ipratropium 0.03 % nasal spray Commonly known as: ATROVENT PLACE 2 SPRAYS INTO THE NOSE 3 (THREE) TIMES DAILY. USE AS NEEDED FOR NASAL DRIP   lansoprazole 30 MG capsule Commonly known as: PREVACID TAKE 1 CAPSULE (30 MG TOTAL) BY MOUTH DAILY AT 12 NOON.   levothyroxine 50 MCG tablet Commonly known as: SYNTHROID TAKE 1 TABLET BY MOUTH EVERY DAY BEFORE BREAKFAST   losartan 50 MG tablet Commonly known as: COZAAR Take 1 tablet (50 mg total) by mouth daily.   metoprolol tartrate 25 MG tablet Commonly known as: LOPRESSOR TAKE 1 TABLET BY MOUTH TWICE A DAY   nitroGLYCERIN 0.4 MG SL tablet Commonly known as: NITROSTAT PLACE 1 TABLET UNDER THE TONGUE EVERY 5 (FIVE) MINUTES X 3 DOSES AS NEEDED FOR CHEST PAIN.   ranolazine 500 MG  12 hr tablet Commonly known as: RANEXA Take 1 tablet (500 mg total) by mouth 2 (two) times daily.   SOMATULINE DEPOT [place] Inject 120 mcg into the skin every 28 (twenty-eight) days. Receives at Dr Gustavo Lah office   SOOTHE XP OP Place 1 drop into both eyes daily as needed (dry eyes).        Allergies:  Allergies  Allergen Reactions   Iohexol Hives     Code: HIVES, Desc: PER [name] @ PRIMARY CARE, PT IS ALLERGIC TO CONTRAST DYE 10/02/08/RM  05/01/10...needs full premeds per our protocol w/ [facility]., Onset Date: [id number]    Iodinated Contrast Media Hives    Past Medical History,  Surgical history, Social history, and Family History were reviewed and updated.  Review of Systems: All other 10 point review of systems is negative.   Physical Exam:  weight is 183 lb 1.3 oz (83 kg). His oral temperature is 98.4 F (36.9 C). His blood pressure is 138/68 and his pulse is 64. His respiration is 17 and oxygen saturation is 100%.   Wt Readings from Last 3 Encounters:  03/18/23 183 lb 1.3 oz (83 kg)  02/17/23 182 lb 12.8 oz (82.9 kg)  02/16/23 182 lb 1.9 oz (82.6 kg)    Ocular: Sclerae unicteric, pupils equal, round and reactive to light Ear-nose-throat: Oropharynx clear, dentition fair Lymphatic: No cervical or supraclavicular adenopathy Lungs no rales or rhonchi, good excursion bilaterally Heart regular rate and rhythm, no murmur appreciated Abd soft, nontender, positive bowel sounds MSK no focal spinal tenderness, no joint edema Neuro: non-focal, well-oriented, appropriate affect Breasts: Deferred   Lab Results  Component Value Date   WBC 5.3 03/18/2023   HGB 11.5 (L) 03/18/2023   HCT 36.0 (L) 03/18/2023   MCV 92.8 03/18/2023   PLT 133 (L) 03/18/2023   Lab Results  Component Value Date   FERRITIN 137 02/16/2023   IRON 69 02/16/2023   TIBC 291 02/16/2023   UIBC 222 02/16/2023   IRONPCTSAT 24 02/16/2023   Lab Results  Component Value Date   RETICCTPCT 1.2 06/14/2022   RBC 3.88 (L) 03/18/2023   No results found for: "KPAFRELGTCHN", "LAMBDASER", "KAPLAMBRATIO" No results found for: "IGGSERUM", "IGA", "IGMSERUM" No results found for: "TOTALPROTELP", "ALBUMINELP", "A1GS", "A2GS", "BETS", "BETA2SER", "GAMS", "MSPIKE", "SPEI"   Chemistry      Component Value Date/Time   NA 140 03/18/2023 1313   NA 141 06/22/2022 0942   NA 144 10/04/2017 0904   NA 141 07/23/2016 1255   K 4.1 03/18/2023 1313   K 3.8 10/04/2017 0904   K 4.1 07/23/2016 1255   CL 104 03/18/2023 1313   CL 103 10/04/2017 0904   CO2 30 03/18/2023 1313   CO2 28 10/04/2017 0904   CO2 27  07/23/2016 1255   BUN 15 03/18/2023 1313   BUN 20 06/22/2022 0942   BUN 14 10/04/2017 0904   BUN 13.8 07/23/2016 1255   CREATININE 0.92 03/18/2023 1313   CREATININE 0.8 10/04/2017 0904   CREATININE 1.0 07/23/2016 1255      Component Value Date/Time   CALCIUM 9.8 03/18/2023 1313   CALCIUM 9.4 10/04/2017 0904   CALCIUM 9.2 07/23/2016 1255   ALKPHOS 51 03/18/2023 1313   ALKPHOS 64 10/04/2017 0904   ALKPHOS 77 07/23/2016 1255   AST 21 03/18/2023 1313   AST 23 07/23/2016 1255   ALT 12 03/18/2023 1313   ALT 22 10/04/2017 0904   ALT 13 07/23/2016 1255   BILITOT 0.7 03/18/2023 1313  BILITOT 1.13 07/23/2016 1255       Impression and Plan: Mr. Caver is a very pleasant 77 yo caucasian gentleman with metastatic low-grade neuroendocrine carcinoma with hepatic metastasis.    We will proceed with Somatuline injection today as planned.  Chromogranin A pending.  Follow-up in 1 month.   Eileen Stanford, NP 5/17/20242:14 PM

## 2023-03-21 ENCOUNTER — Inpatient Hospital Stay: Payer: Medicare HMO

## 2023-03-21 ENCOUNTER — Ambulatory Visit
Admission: RE | Admit: 2023-03-21 | Discharge: 2023-03-21 | Disposition: A | Discharge status: Home / Self Care | Payer: Medicare HMO | Source: Ambulatory Visit | Attending: Radiation Oncology | Admitting: Radiation Oncology

## 2023-03-21 ENCOUNTER — Encounter: Payer: Self-pay | Admitting: Radiation Oncology

## 2023-03-21 DIAGNOSIS — C7B8 Other secondary neuroendocrine tumors: Secondary | ICD-10-CM

## 2023-03-21 DIAGNOSIS — C7951 Secondary malignant neoplasm of bone: Secondary | ICD-10-CM | POA: Diagnosis not present

## 2023-03-21 DIAGNOSIS — C7A1 Malignant poorly differentiated neuroendocrine tumors: Secondary | ICD-10-CM | POA: Diagnosis not present

## 2023-03-21 NOTE — Progress Notes (Signed)
Telephone nursing appointment for patient to review most recent scan results from 03/16/2023. I verified patient's identity and began nursing interview. Patient reports doing well. No issues conveyed at this time.   Meaningful use complete.   Patient aware of their 2:00pm telephone appointment w/ Laurence Aly PA-C. I left my extension [phone] in case patient needs anything. Patient verbalized understanding. This concludes the nursing interview.   Patient contact [phone]     Ruel Favors, LPN

## 2023-03-21 NOTE — Progress Notes (Addendum)
Radiation Oncology         [phone] ________________________________  Outpatient Follow Up - Conducted via telephone at patient request.  I spoke with the patient to conduct this visit via telephone. The patient was notified in advance and was offered an in person or telemedicine meeting to allow for face to face communication but instead preferred to proceed with a telephone visit.   Name: Brian Mays        MRN: [id number]  Date of Service: 03/21/2023 DOB: December 27, 1945  WU:[id number], Brian Found, MD  Copland, Brian Found, MD     REFERRING PHYSICIAN: Copland, Brian Found, MD   DIAGNOSIS: The primary encounter diagnosis was Metastatic cancer to spine [facility]). A diagnosis of Metastatic malignant neuroendocrine tumor to liver [facility]) was also pertinent to this visit.   HISTORY OF PRESENT ILLNESS: Brian Mays is a 77 y.o. male s with a history of neuroendocrine tumor in 2014, originally in the small bowel.  He has had known metastatic disease and has received Y 90 intrahepatic therapy in November 2016, Lutathera injection x4 cycles in 2019 and has remained on Somatuline under the care of Dr. Myna Hidalgo.  PET in October 2022 showed a lesion in T6 vertebral body concerning for further neuroendocrine tumor disease stable mild uptake in the T12 vertebral body was also appreciated and hepatic metastases and central mesenteric mass were also noted but stable.  An MRI of the thoracic spine on 09/14/2021 showed enhancing lesions at T6 and T12 consistent with metastatic disease but without pathologic fracture or compromise to his spinal canal or cord.  He went on to receive stereotactic radiosurgery to T6 and T12 and single fraction style therapy which he completed in January 2023.  His first posttreatment MRI of the spine on 02/04/2022 showed posttreatment effect and T6 and T12 but a 4 mm enhancing lesion in the T7 vertebral body that was felt to be new since prior imaging.  He had a PET scan in April 2023 and  there were no findings in the T7 location.    In some additional previous scans he had findings consistent with hemangiomatous changes at T5 and L3-4.  He also has degenerative changes in his cervical spine that has been seen on complete imaging of the spine in the past. We have performed more frequent imaging to follow and these sites of enhancement have been stable over time. His most recent scan on 03/16/23 again show stability in the treated lesions of T6 and T12, but no concerns of malignant findings, rather of stable findings at other multiple levels in the thoracic spine suggestive of hemangioma.  Of note the patient continues Lanreotide injections with Dr. Myna Hidalgo. He's contacted today to review these results.    PREVIOUS RADIATION THERAPY:     11/06/2021 through 11/06/2021 SRS Spine Site Technique Total Dose (Gy) Dose per Fx (Gy) Completed Fx Beam Energies  Thoracic Spine: Spine_T6 IMRT 18/18 18 1/1 6XFFF  Thoracic Spine: Spine_T12         Lutathera (Lu 177) injection on 02/01/2018 - s/p cycle 4  PAST MEDICAL HISTORY:  Past Medical History:  Diagnosis Date   Anemia    in past   Cataract    Colon polyps    Diverticulosis    Gallstones    GERD (gastroesophageal reflux disease)    Heart attack (HCC)    mild, Spring 2017   Heart murmur    Hemorrhoid    Hiatal hernia    HTN (hypertension)  Hyperlipidemia    Hypothyroidism    Iron deficiency anemia due to chronic blood loss 03/29/2019   Iron malabsorption 03/29/2019   Metastatic carcinoma (HCC) 2010   Dr Twanna Hy   Neuroendocrine cancer [facility]) 08/2021   Pancreatitis 1998   chronic       PAST SURGICAL HISTORY: Past Surgical History:  Procedure Laterality Date   APPENDECTOMY  1962   CARDIAC CATHETERIZATION N/A 02/10/2016   Procedure: Left Heart Cath and Coronary Angiography;  Surgeon: Yates Decamp, MD;  Location: [facility] INVASIVE CV LAB;  Service: Cardiovascular;  Laterality: N/A;   CARDIAC CATHETERIZATION  02/10/2016   Procedure:  Coronary/Graft Atherectomy;  Surgeon: Yates Decamp, MD;  Location: [facility] INVASIVE CV LAB;  Service: Cardiovascular;;   CARDIAC CATHETERIZATION  02/10/2016   Procedure: Coronary Stent Intervention;  Surgeon: Yates Decamp, MD;  Location: [facility] INVASIVE CV LAB;  Service: Cardiovascular;;   CATARACT EXTRACTION W/ INTRAOCULAR LENS  IMPLANT, BILATERAL Bilateral    CORONARY STENT INTERVENTION N/A 12/16/2021   Procedure: CORONARY STENT INTERVENTION;  Surgeon: Orbie Pyo, MD;  Location: [facility] INVASIVE CV LAB;  Service: Cardiovascular;  Laterality: N/A;   IR GENERIC HISTORICAL  12/30/2015   IR RADIOLOGIST EVAL & MGMT 12/30/2015 Irish Lack, MD [facility] INTERV RAD   IR GENERIC HISTORICAL  11/09/2016   IR RADIOLOGIST EVAL & MGMT 11/09/2016 Irish Lack, MD [facility] INTERV RAD   IR RADIOLOGIST EVAL & MGMT  03/01/2017   IR RADIOLOGIST EVAL & MGMT  06/29/2017   LAPAROSCOPIC CHOLECYSTECTOMY  1999   LEFT HEART CATH AND CORONARY ANGIOGRAPHY N/A 12/16/2021   Procedure: LEFT HEART CATH AND CORONARY ANGIOGRAPHY;  Surgeon: Orbie Pyo, MD;  Location: [facility] INVASIVE CV LAB;  Service: Cardiovascular;  Laterality: N/A;   LIVER BIOPSY  2010   RADIOACTIVE SEED IMPLANT  X 3   "to my liver"   TUMOR EXCISION  01/2009   Carcinoil Resection    TUMOR REMOVAL     from small intestine     FAMILY HISTORY:  Family History  Problem Relation Age of Onset   Kidney disease Mother    Hyperlipidemia Mother    Hypertension Mother    COPD Father    Ulcerative colitis Daughter    Colon cancer Neg Hx    Esophageal cancer Neg Hx    Rectal cancer Neg Hx    Stomach cancer Neg Hx      SOCIAL HISTORY:  reports that he quit smoking about 25 years ago. His smoking use included cigarettes. He started smoking about 63 years ago. He has a 52.50 pack-year smoking history. He has never used smokeless tobacco. He reports current alcohol use of about 3.0 - 4.0 standard drinks of alcohol per week. He reports that he does not use drugs. The patient is  widowed and lives in [place]. He enjoys spending time with friends and family especially his granddaughter.    ALLERGIES: Iohexol and Iodinated contrast media   MEDICATIONS:  Current Outpatient Medications  Medication Sig Dispense Refill   Artificial Tear Solution (SOOTHE XP OP) Place 1 drop into both eyes daily as needed (dry eyes).     aspirin EC 81 MG tablet Take 81 mg by mouth every morning.     atorvastatin (LIPITOR) 80 MG tablet TAKE 1 TABLET BY MOUTH EVERY DAY 90 tablet 1   Cholecalciferol 1000 UNITS tablet Take 1,000 Units by mouth 2 (two) times daily.     clopidogrel (PLAVIX) 75 MG tablet TAKE 1 TABLET BY MOUTH EVERY DAY 90 tablet  2   diphenhydramine-acetaminophen (TYLENOL PM) 25-500 MG TABS tablet Take 1 tablet by mouth at bedtime as needed (sleep/pain).     fluticasone (FLONASE) 50 MCG/ACT nasal spray Place 2 sprays into both nostrils daily as needed (sinuses).  11   hydrocortisone (ANUSOL-HC) 25 MG suppository INSERT 1 SUPPOSITORY RECTALLY TWICE A DAY AS NEEDED FOR HEMORRHOIDS 30 suppository 0   ipratropium (ATROVENT) 0.03 % nasal spray PLACE 2 SPRAYS INTO THE NOSE 3 (THREE) TIMES DAILY. USE AS NEEDED FOR NASAL DRIP 30 mL 2   Lanreotide Acetate (SOMATULINE DEPOT [place]) Inject 120 mcg into the skin every 28 (twenty-eight) days. Receives at Dr Gustavo Lah office     lansoprazole (PREVACID) 30 MG capsule TAKE 1 CAPSULE (30 MG TOTAL) BY MOUTH DAILY AT 12 NOON. 90 capsule 3   levothyroxine (SYNTHROID) 50 MCG tablet TAKE 1 TABLET BY MOUTH EVERY DAY BEFORE BREAKFAST 90 tablet 1   losartan (COZAAR) 50 MG tablet Take 1 tablet (50 mg total) by mouth daily. 90 tablet 3   metoprolol tartrate (LOPRESSOR) 25 MG tablet TAKE 1 TABLET BY MOUTH TWICE A DAY 180 tablet 1   nitroGLYCERIN (NITROSTAT) 0.4 MG SL tablet PLACE 1 TABLET UNDER THE TONGUE EVERY 5 (FIVE) MINUTES X 3 DOSES AS NEEDED FOR CHEST PAIN. 25 tablet 4   ranolazine (RANEXA) 500 MG 12 hr tablet Take 1 tablet (500 mg total) by mouth 2  (two) times daily. 180 tablet 1   vitamin B-12 (CYANOCOBALAMIN) 1000 MCG tablet Take 1,000 mcg by mouth every other day.     No current facility-administered medications for this encounter.     REVIEW OF SYSTEMS: The patient reports he is doing well. He reports his wife passed away on Mar 11, 2023, and then the following two Fridays two other close friends passed away. He feels supported by his family and large friend group. He speaks highly of the care his wife received under hospice care. He reports he is very excited about a new inflatable kayak he bought so he can [place]. No new complaints of back pain, numbness or tingling or difficulties with his lower extremities are noted. No other complaints are verbalized.   PHYSICAL EXAM:   Pain Assessment Pain Score: 0-No pain/10  Unable to assess given encounter type.  ECOG = 0  0 - Asymptomatic (Fully active, able to carry on all predisease activities without restriction)  1 - Symptomatic but completely ambulatory (Restricted in physically strenuous activity but ambulatory and able to carry out work of a light or sedentary nature. For example, light housework, office work)  2 - Symptomatic, <50% in bed during the day (Ambulatory and capable of all self care but unable to carry out any work activities. Up and about more than 50% of waking hours)  3 - Symptomatic, >50% in bed, but not bedbound (Capable of only limited self-care, confined to bed or chair 50% or more of waking hours)  4 - Bedbound (Completely disabled. Cannot carry on any self-care. Totally confined to bed or chair)  5 - Death   Santiago Glad MM, Creech RH, Tormey DC, et al. [phone]). "Toxicity and response criteria of the [facility]". Am. Evlyn Clines. Oncol. 5 (6): 649-55    LABORATORY DATA:  Lab Results  Component Value Date   WBC 5.3 03/18/2023   HGB 11.5 (L) 03/18/2023   HCT 36.0 (L) 03/18/2023   MCV 92.8 03/18/2023   PLT 133  (L) 03/18/2023   Lab Results  Component Value Date   NA 140 03/18/2023   K 4.1 03/18/2023   CL 104 03/18/2023   CO2 30 03/18/2023   Lab Results  Component Value Date   ALT 12 03/18/2023   AST 21 03/18/2023   ALKPHOS 51 03/18/2023   BILITOT 0.7 03/18/2023      RADIOGRAPHY: MR THORACIC SPINE W WO CONTRAST  Result Date: 03/20/2023 CLINICAL DATA:  Metastatic disease evaluation. Follow-up of treated metastatic disease. EXAM: MRI THORACIC WITHOUT AND WITH CONTRAST TECHNIQUE: Multiplanar and multiecho pulse sequences of the thoracic spine were obtained without and with intravenous contrast. CONTRAST:  8mL GADAVIST GADOBUTROL 1 MMOL/ML IV SOLN COMPARISON:  MRI thoracic spine without and with contrast 11/17/2022 FINDINGS: Alignment:  No significant listhesis is present. Vertebrae: Diffuse increased T1 marrow signal changes began at T5. Hemangioma T5 is stable. An 8 mm enhancing lesion posteriorly at T6 extends to the pedicle. A 9 mm lesion in the left lamina of T6 is stable. Punctate lesion along the inferior endplate of T6 in the midline is stable. A 6 mm enhancing lesion at T7 is stable. No new lesions scratched at no focal lesions are present T8 through T11. The anterior inferior endplate of T12 on the right demonstrates more confluent enhancement on today's study. It is not significantly changed in size. A 6 mm lesion in the left paramedian posterior T12 vertebral body is stable. No new lesions are present.  Vertebral body heights are maintained. Cord:  Normal signal and morphology. Paraspinal and other soft tissues: Paraspinous soft tissues are unremarkable. Disc levels: No significant thoracic disc disease or stenosis is present. IMPRESSION: 1. No significant interval change in size or number of multiple osseous lesions. 2. No new lesions are present. 3. No significant disc disease or stenosis. Electronically Signed   By: Marin Roberts M.D.   On: 03/20/2023 16:54        IMPRESSION/PLAN: 1. Metastatic Neuroendocrine Tumor to Thoracic Spine. We discussed the patient's imaging findings and overall stability without concerns for active cancer. We recommend continued surveillance. He verbalizes a desire to perform his MRI scans less frequently, perhaps every 6 months. After discussing this and considering his tumor has behaved more favorably than other common solid tumors, we agreed to continue at 6 month intervals. He is not interested in annual complete spinal imaging. He will notify us if he has further symptoms or progressive changes in the cervical spine, and continue Somatuline with Dr. Loraine Maple and let us know if he has any other complaints or concerns.  2. Claustrophobia. We did discuss Ativan prior to future scans to try to make this more tolerable with MRIs in the future. I will send this in closer to the time his scan is due.    This encounter was conducted via telephone.  The patient has provided two factor identification and has given verbal consent for this type of encounter and has been advised to only accept a meeting of this type in a secure network environment. The time spent during this encounter was 35 minutes including preparation, discussion, and coordination of the patient's care. The attendants for this meeting include Ronny Bacon  and [name].  During the encounter,  Ronny Bacon was located remotely.  [name] was located at home.        Osker Mason, [facility]   **Disclaimer: This note was dictated with voice recognition software. Similar sounding words can inadvertently be transcribed and this note may contain transcription errors  which may not have been corrected upon publication of note.**

## 2023-03-22 ENCOUNTER — Other Ambulatory Visit: Payer: Self-pay | Admitting: Radiation Therapy

## 2023-03-22 DIAGNOSIS — C7951 Secondary malignant neoplasm of bone: Secondary | ICD-10-CM

## 2023-03-22 LAB — CHROMOGRANIN A: Chromogranin A (ng/mL): 176.7 ng/mL — ABNORMAL HIGH (ref 0.0–101.8)

## 2023-03-24 DIAGNOSIS — Z0100 Encounter for examination of eyes and vision without abnormal findings: Secondary | ICD-10-CM | POA: Diagnosis not present

## 2023-03-29 ENCOUNTER — Other Ambulatory Visit: Payer: Self-pay | Admitting: Radiation Therapy

## 2023-04-18 ENCOUNTER — Encounter: Payer: Self-pay | Admitting: Family

## 2023-04-18 ENCOUNTER — Inpatient Hospital Stay: Payer: Medicare HMO | Attending: Hematology & Oncology

## 2023-04-18 ENCOUNTER — Inpatient Hospital Stay: Payer: Medicare HMO

## 2023-04-18 ENCOUNTER — Inpatient Hospital Stay: Payer: Medicare HMO | Admitting: Family

## 2023-04-18 VITALS — BP 142/64 | HR 64 | Temp 98.0°F | Resp 17 | Wt 181.8 lb

## 2023-04-18 DIAGNOSIS — C7B8 Other secondary neuroendocrine tumors: Secondary | ICD-10-CM

## 2023-04-18 DIAGNOSIS — D509 Iron deficiency anemia, unspecified: Secondary | ICD-10-CM | POA: Insufficient documentation

## 2023-04-18 DIAGNOSIS — C7A8 Other malignant neuroendocrine tumors: Secondary | ICD-10-CM | POA: Insufficient documentation

## 2023-04-18 DIAGNOSIS — D500 Iron deficiency anemia secondary to blood loss (chronic): Secondary | ICD-10-CM

## 2023-04-18 LAB — IRON AND IRON BINDING CAPACITY (CC-WL,HP ONLY)
Iron: 62 ug/dL (ref 45–182)
Saturation Ratios: 21 % (ref 17.9–39.5)
TIBC: 298 ug/dL (ref 250–450)
UIBC: 236 ug/dL (ref 117–376)

## 2023-04-18 LAB — CMP (CANCER CENTER ONLY)
ALT: 13 U/L (ref 0–44)
AST: 23 U/L (ref 15–41)
Albumin: 4 g/dL (ref 3.5–5.0)
Alkaline Phosphatase: 46 U/L (ref 38–126)
Anion gap: 5 (ref 5–15)
BUN: 17 mg/dL (ref 8–23)
CO2: 32 mmol/L (ref 22–32)
Calcium: 9.5 mg/dL (ref 8.9–10.3)
Chloride: 105 mmol/L (ref 98–111)
Creatinine: 1.1 mg/dL (ref 0.61–1.24)
GFR, Estimated: >60 mL/min (ref >60)
Glucose, Bld: 122 mg/dL — ABNORMAL HIGH (ref 70–99)
Potassium: 4.6 mmol/L (ref 3.5–5.1)
Sodium: 142 mmol/L (ref 135–145)
Total Bilirubin: 0.7 mg/dL (ref 0.3–1.2)
Total Protein: 6.8 g/dL (ref 6.5–8.1)

## 2023-04-18 LAB — CBC WITH DIFFERENTIAL (CANCER CENTER ONLY)
Abs Immature Granulocytes: 0.02 10*3/uL (ref 0.00–0.07)
Basophils Absolute: 0.1 10*3/uL (ref 0.0–0.1)
Basophils Relative: 1 %
Eosinophils Absolute: 0.3 10*3/uL (ref 0.0–0.5)
Eosinophils Relative: 6 %
HCT: 35.4 % — ABNORMAL LOW (ref 39.0–52.0)
Hemoglobin: 11.4 g/dL — ABNORMAL LOW (ref 13.0–17.0)
Immature Granulocytes: 0 %
Lymphocytes Relative: 15 %
Lymphs Abs: 0.9 10*3/uL (ref 0.7–4.0)
MCH: 30.3 pg (ref 26.0–34.0)
MCHC: 32.2 g/dL (ref 30.0–36.0)
MCV: 94.1 fL (ref 80.0–100.0)
Monocytes Absolute: 0.4 10*3/uL (ref 0.1–1.0)
Monocytes Relative: 6 %
Neutro Abs: 4.2 10*3/uL (ref 1.7–7.7)
Neutrophils Relative %: 72 %
Platelet Count: 134 10*3/uL — ABNORMAL LOW (ref 150–400)
RBC: 3.76 MIL/uL — ABNORMAL LOW (ref 4.22–5.81)
RDW: 15.1 % (ref 11.5–15.5)
WBC Count: 5.8 10*3/uL (ref 4.0–10.5)
nRBC: 0 % (ref 0.0–0.2)

## 2023-04-18 LAB — FERRITIN: Ferritin: 121 ng/mL (ref 24–336)

## 2023-04-18 MED ORDER — LANREOTIDE ACETATE 120 MG/0.5ML ~~LOC~~ SOLN
120.0000 mg | Freq: Once | SUBCUTANEOUS | Status: AC
  Administered 2023-04-18: 120 mg via SUBCUTANEOUS
  Filled 2023-04-18: qty 120

## 2023-04-18 NOTE — Patient Instructions (Signed)
Lanreotide Injection What is this medication? LANREOTIDE (lan REE oh tide) treats high levels of growth hormone (acromegaly). It is used when other therapies have not worked well enough or cannot be tolerated. It works by reducing the amount of growth hormone your body makes. This reduces symptoms and the risk of health problems caused by too much growth hormone, such as diabetes and heart disease. It may also be used to treat neuroendocrine tumors, a cancer of the cells that release hormones and other substances in your body. It works by slowing down the release of these substances from the cells. This slows tumor growth. It also decreases the symptoms of carcinoid syndrome, such as flushing or diarrhea. This medicine may be used for other purposes; ask your health care provider or pharmacist if you have questions. COMMON BRAND NAME(S): Somatuline [place] What should I tell my care team before I take this medication? They need to know if you have any of these conditions: Diabetes Gallbladder disease Heart disease Kidney disease Liver disease Thyroid disease An unusual or allergic reaction to lanreotide, other medications, foods, dyes, or preservatives Pregnant or trying to get pregnant Breast-feeding How should I use this medication? This medication is injected under the skin. It is given by your care team in a hospital or clinic setting. Talk to your care team about the use of this medication in children. Special care may be needed. Overdosage: If you think you have taken too much of this medicine contact a poison control center or emergency room at once. NOTE: This medicine is only for you. Do not share this medicine with others. What if I miss a dose? Keep appointments for follow-up doses. It is important not to miss your dose. Call your care team if you are unable to keep an appointment. What may interact with this medication? Bromocriptine Cyclosporine Certain medications for blood  pressure, heart disease, irregular heartbeat Certain medications for diabetes Quinidine Terfenadine This list may not describe all possible interactions. Give your health care provider a list of all the medicines, herbs, non-prescription drugs, or dietary supplements you use. Also tell them if you smoke, drink alcohol, or use illegal drugs. Some items may interact with your medicine. What should I watch for while using this medication? Visit your care team for regular checks on your progress. Tell your care team if your symptoms do not start to get better or if they get worse. Your condition will be monitored carefully while you are receiving this medication. You may need blood work while you are taking this medication. This medication may increase blood sugar. The risk may be higher in patients who already have diabetes. Ask your care team what you can do to lower your risk of diabetes while taking this medication. Talk to your care team if you wish to become pregnant or think you may be pregnant. This medication can cause serious birth defects. Do not breast-feed while taking this medication and for 6 months after stopping therapy. This medication may cause infertility. Talk to your care team if you are concerned about your fertility. What side effects may I notice from receiving this medication? Side effects that you should report to your care team as soon as possible: Allergic reactions--skin rash, itching, hives, swelling of the face, lips, tongue, or throat Gallbladder problems--severe stomach pain, nausea, vomiting, fever High blood sugar (hyperglycemia)--increased thirst or amount of urine, unusual weakness or fatigue, blurry vision Increase in blood pressure Low blood sugar (hypoglycemia)--tremors or shaking, anxiety, sweating, cold   or clammy skin, confusion, dizziness, rapid heartbeat Low thyroid levels (hypothyroidism)--unusual weakness or fatigue, increased sensitivity to cold,  constipation, hair loss, dry skin, weight gain, feelings of depression Slow heartbeat--dizziness, feeling faint or lightheaded, confusion, trouble breathing, unusual weakness or fatigue Side effects that usually do not require medical attention (report to your care team if they continue or are bothersome): Diarrhea Dizziness Headache Muscle spasms Nausea Pain, redness, irritation, or bruising at the injection site Stomach pain This list may not describe all possible side effects. Call your doctor for medical advice about side effects. You may report side effects to FDA at 1-800-FDA-1088. Where should I keep my medication? This medication is given in a hospital or clinic. It will not be stored at home. NOTE: This sheet is a summary. It may not cover all possible information. If you have questions about this medicine, talk to your doctor, pharmacist, or health care provider.  2024 Elsevier/Gold Standard (2022-03-10 00:00:00)  

## 2023-04-18 NOTE — Progress Notes (Signed)
Hematology and Oncology Follow Up Visit  Brian Mays [id number] 1946/03/27 77 y.o. 04/18/2023   Principle Diagnosis:  Metastatic low grade neuroendocrine tumor-hepatic metastases Iron deficiency anemia   Past Diagnosis:  S/p yttrium-90 intrahepatic therapy - November 2016 Lutathera (Lu 177) injection on 02/01/2018 - s/p cycle 4 SBRT to T6 lesion -- 11/06/2021   Current Therapy:        Somatuline 120 mg monthly IV Iron as indicated  -- Injectafer given on 01/2021   Interim History:  Brian Mays is here today for follow-up and treatment. He is doing well but has some mild fatigue at times.  There are nights that he does not sleep well and will take a Tylenol PM if needed.  Chromogranin A was 176 last month.  No fever, chills, n/v, cough, rash, dizziness, SOB, chest pain, palpitations, abdominal pain or changes in bowel or bladder habits at this time.  No swelling, numbness or tingling in his extremities.  He states that he has had a couple falls over the last month due to tripping over branches while walking outdoors. Thankfully he was not seriously injured. He will try getting some trekking poles to help give him extra support on the uneven terrain when hiking.  He has also returned to golfing whish has been good for him.  No syncope reported.  Appetite and hydration are better. He is really enjoying going to [employer] and finding easy meals for 1. His daughter is also making sure he is eating well. Weight is stable at 181 lbs.   ECOG Performance Status: 1 - Symptomatic but completely ambulatory  Medications:  Allergies as of 04/18/2023       Reactions   Iohexol Hives    Code: HIVES, Desc: PER [name] @ [facility], PT IS ALLERGIC TO CONTRAST DYE 10/02/08/RM  05/01/10...needs full premeds per our protocol w/ [facility]., Onset Date: [id number]   Iodinated Contrast Media Hives        Medication List        Accurate as of April 18, 2023  2:02 PM. If you have any questions, ask  your nurse or doctor.          aspirin EC 81 MG tablet Take 81 mg by mouth every morning.   atorvastatin 80 MG tablet Commonly known as: LIPITOR TAKE 1 TABLET BY MOUTH EVERY DAY   Cholecalciferol 25 MCG (1000 UT) tablet Take 1,000 Units by mouth 2 (two) times daily.   clopidogrel 75 MG tablet Commonly known as: PLAVIX TAKE 1 TABLET BY MOUTH EVERY DAY   cyanocobalamin 1000 MCG tablet Commonly known as: VITAMIN B12 Take 1,000 mcg by mouth every other day.   diphenhydramine-acetaminophen 25-500 MG Tabs tablet Commonly known as: TYLENOL PM Take 1 tablet by mouth at bedtime as needed (sleep/pain).   fluticasone 50 MCG/ACT nasal spray Commonly known as: FLONASE Place 2 sprays into both nostrils daily as needed (sinuses).   hydrocortisone 25 MG suppository Commonly known as: ANUSOL-HC INSERT 1 SUPPOSITORY RECTALLY TWICE A DAY AS NEEDED FOR HEMORRHOIDS   ipratropium 0.03 % nasal spray Commonly known as: ATROVENT PLACE 2 SPRAYS INTO THE NOSE 3 (THREE) TIMES DAILY. USE AS NEEDED FOR NASAL DRIP   lansoprazole 30 MG capsule Commonly known as: PREVACID TAKE 1 CAPSULE (30 MG TOTAL) BY MOUTH DAILY AT 12 NOON.   levothyroxine 50 MCG tablet Commonly known as: SYNTHROID TAKE 1 TABLET BY MOUTH EVERY DAY BEFORE BREAKFAST   losartan 50 MG tablet Commonly known as: COZAAR  Take 1 tablet (50 mg total) by mouth daily.   metoprolol tartrate 25 MG tablet Commonly known as: LOPRESSOR TAKE 1 TABLET BY MOUTH TWICE A DAY   nitroGLYCERIN 0.4 MG SL tablet Commonly known as: NITROSTAT PLACE 1 TABLET UNDER THE TONGUE EVERY 5 (FIVE) MINUTES X 3 DOSES AS NEEDED FOR CHEST PAIN.   ranolazine 500 MG 12 hr tablet Commonly known as: RANEXA Take 1 tablet (500 mg total) by mouth 2 (two) times daily.   SOMATULINE DEPOT [place] Inject 120 mcg into the skin every 28 (twenty-eight) days. Receives at Dr Gustavo Lah office   SOOTHE XP OP Place 1 drop into both eyes daily as needed (dry eyes).         Allergies:  Allergies  Allergen Reactions   Iohexol Hives     Code: HIVES, Desc: PER [name] @ [facility], PT IS ALLERGIC TO CONTRAST DYE 10/02/08/RM  05/01/10...needs full premeds per our protocol w/ [facility]., Onset Date: [id number]    Iodinated Contrast Media Hives    Past Medical History, Surgical history, Social history, and Family History were reviewed and updated.  Review of Systems: All other 10 point review of systems is negative.   Physical Exam:  weight is 181 lb 12.8 oz (82.5 kg). His oral temperature is 98 F (36.7 C). His blood pressure is 142/64 (abnormal) and his pulse is 64. His respiration is 17 and oxygen saturation is 100%.   Wt Readings from Last 3 Encounters:  04/18/23 181 lb 12.8 oz (82.5 kg)  03/18/23 183 lb 1.3 oz (83 kg)  02/17/23 182 lb 12.8 oz (82.9 kg)    Ocular: Sclerae unicteric, pupils equal, round and reactive to light Ear-nose-throat: Oropharynx clear, dentition fair Lymphatic: No cervical or supraclavicular adenopathy Lungs no rales or rhonchi, good excursion bilaterally Heart regular rate and rhythm, no murmur appreciated Abd soft, nontender, positive bowel sounds MSK no focal spinal tenderness, no joint edema Neuro: non-focal, well-oriented, appropriate affect Breasts: Deferred   Lab Results  Component Value Date   WBC 5.8 04/18/2023   HGB 11.4 (L) 04/18/2023   HCT 35.4 (L) 04/18/2023   MCV 94.1 04/18/2023   PLT 134 (L) 04/18/2023   Lab Results  Component Value Date   FERRITIN 119 03/18/2023   IRON 62 03/18/2023   TIBC 304 03/18/2023   UIBC 242 03/18/2023   IRONPCTSAT 20 03/18/2023   Lab Results  Component Value Date   RETICCTPCT 1.2 06/14/2022   RBC 3.76 (L) 04/18/2023   No results found for: "KPAFRELGTCHN", "LAMBDASER", "KAPLAMBRATIO" No results found for: "IGGSERUM", "IGA", "IGMSERUM" No results found for: "TOTALPROTELP", "ALBUMINELP", "A1GS", "A2GS", "BETS", "BETA2SER", "GAMS", "MSPIKE", "SPEI"   Chemistry       Component Value Date/Time   NA 142 04/18/2023 1313   NA 141 06/22/2022 0942   NA 144 10/04/2017 0904   NA 141 07/23/2016 1255   K 4.6 04/18/2023 1313   K 3.8 10/04/2017 0904   K 4.1 07/23/2016 1255   CL 105 04/18/2023 1313   CL 103 10/04/2017 0904   CO2 32 04/18/2023 1313   CO2 28 10/04/2017 0904   CO2 27 07/23/2016 1255   BUN 17 04/18/2023 1313   BUN 20 06/22/2022 0942   BUN 14 10/04/2017 0904   BUN 13.8 07/23/2016 1255   CREATININE 1.10 04/18/2023 1313   CREATININE 0.8 10/04/2017 0904   CREATININE 1.0 07/23/2016 1255      Component Value Date/Time   CALCIUM 9.5 04/18/2023 1313   CALCIUM 9.4 10/04/2017  1610   CALCIUM 9.2 07/23/2016 1255   ALKPHOS 46 04/18/2023 1313   ALKPHOS 64 10/04/2017 0904   ALKPHOS 77 07/23/2016 1255   AST 23 04/18/2023 1313   AST 23 07/23/2016 1255   ALT 13 04/18/2023 1313   ALT 22 10/04/2017 0904   ALT 13 07/23/2016 1255   BILITOT 0.7 04/18/2023 1313   BILITOT 1.13 07/23/2016 1255       Impression and Plan: Brian Mays is a very pleasant 77 yo caucasian gentleman with metastatic low-grade neuroendocrine carcinoma with hepatic metastasis.    We will proceed with Somatuline injection today as planned.  Chromogranin A pending.  Follow-up with MD in 1 month.   Eileen Stanford, NP 6/17/20242:02 PM

## 2023-04-19 LAB — CHROMOGRANIN A: Chromogranin A (ng/mL): 136.3 ng/mL — ABNORMAL HIGH (ref 0.0–101.8)

## 2023-05-08 DIAGNOSIS — R69 Illness, unspecified: Secondary | ICD-10-CM | POA: Diagnosis not present

## 2023-05-09 ENCOUNTER — Other Ambulatory Visit: Payer: Self-pay | Admitting: Family Medicine

## 2023-05-09 DIAGNOSIS — R69 Illness, unspecified: Secondary | ICD-10-CM | POA: Diagnosis not present

## 2023-05-09 DIAGNOSIS — E039 Hypothyroidism, unspecified: Secondary | ICD-10-CM

## 2023-05-10 DIAGNOSIS — R69 Illness, unspecified: Secondary | ICD-10-CM | POA: Diagnosis not present

## 2023-05-12 ENCOUNTER — Encounter: Payer: Self-pay | Admitting: Hematology & Oncology

## 2023-05-18 ENCOUNTER — Inpatient Hospital Stay: Payer: Medicare HMO | Attending: Hematology & Oncology

## 2023-05-18 ENCOUNTER — Inpatient Hospital Stay: Payer: Medicare HMO

## 2023-05-18 ENCOUNTER — Encounter: Payer: Self-pay | Admitting: Hematology & Oncology

## 2023-05-18 ENCOUNTER — Inpatient Hospital Stay: Payer: Medicare HMO | Admitting: Hematology & Oncology

## 2023-05-18 ENCOUNTER — Other Ambulatory Visit: Payer: Self-pay

## 2023-05-18 VITALS — BP 166/60 | HR 64 | Temp 99.0°F | Resp 18 | Wt 184.0 lb

## 2023-05-18 DIAGNOSIS — R978 Other abnormal tumor markers: Secondary | ICD-10-CM | POA: Diagnosis not present

## 2023-05-18 DIAGNOSIS — D500 Iron deficiency anemia secondary to blood loss (chronic): Secondary | ICD-10-CM

## 2023-05-18 DIAGNOSIS — C7B8 Other secondary neuroendocrine tumors: Secondary | ICD-10-CM

## 2023-05-18 DIAGNOSIS — C7A8 Other malignant neuroendocrine tumors: Secondary | ICD-10-CM | POA: Insufficient documentation

## 2023-05-18 DIAGNOSIS — D509 Iron deficiency anemia, unspecified: Secondary | ICD-10-CM | POA: Insufficient documentation

## 2023-05-18 DIAGNOSIS — R69 Illness, unspecified: Secondary | ICD-10-CM | POA: Diagnosis not present

## 2023-05-18 LAB — CBC WITH DIFFERENTIAL (CANCER CENTER ONLY)
Abs Immature Granulocytes: 0.01 10*3/uL (ref 0.00–0.07)
Basophils Absolute: 0 10*3/uL (ref 0.0–0.1)
Basophils Relative: 1 %
Eosinophils Absolute: 0.4 10*3/uL (ref 0.0–0.5)
Eosinophils Relative: 8 %
HCT: 37.5 % — ABNORMAL LOW (ref 39.0–52.0)
Hemoglobin: 11.8 g/dL — ABNORMAL LOW (ref 13.0–17.0)
Immature Granulocytes: 0 %
Lymphocytes Relative: 13 %
Lymphs Abs: 0.6 10*3/uL — ABNORMAL LOW (ref 0.7–4.0)
MCH: 30.2 pg (ref 26.0–34.0)
MCHC: 31.5 g/dL (ref 30.0–36.0)
MCV: 95.9 fL (ref 80.0–100.0)
Monocytes Absolute: 0.2 10*3/uL (ref 0.1–1.0)
Monocytes Relative: 5 %
Neutro Abs: 3.5 10*3/uL (ref 1.7–7.7)
Neutrophils Relative %: 73 %
Platelet Count: 134 10*3/uL — ABNORMAL LOW (ref 150–400)
RBC: 3.91 MIL/uL — ABNORMAL LOW (ref 4.22–5.81)
RDW: 14.6 % (ref 11.5–15.5)
WBC Count: 4.8 10*3/uL (ref 4.0–10.5)
nRBC: 0 % (ref 0.0–0.2)

## 2023-05-18 LAB — CMP (CANCER CENTER ONLY)
ALT: 12 U/L (ref 0–44)
AST: 21 U/L (ref 15–41)
Albumin: 3.9 g/dL (ref 3.5–5.0)
Alkaline Phosphatase: 49 U/L (ref 38–126)
Anion gap: 7 (ref 5–15)
BUN: 18 mg/dL (ref 8–23)
CO2: 30 mmol/L (ref 22–32)
Calcium: 9.4 mg/dL (ref 8.9–10.3)
Chloride: 105 mmol/L (ref 98–111)
Creatinine: 1.11 mg/dL (ref 0.61–1.24)
GFR, Estimated: >60 mL/min (ref >60)
Glucose, Bld: 183 mg/dL — ABNORMAL HIGH (ref 70–99)
Potassium: 4.3 mmol/L (ref 3.5–5.1)
Sodium: 142 mmol/L (ref 135–145)
Total Bilirubin: 0.9 mg/dL (ref 0.3–1.2)
Total Protein: 7 g/dL (ref 6.5–8.1)

## 2023-05-18 LAB — FERRITIN: Ferritin: 103 ng/mL (ref 24–336)

## 2023-05-18 LAB — IRON AND IRON BINDING CAPACITY (CC-WL,HP ONLY)
Iron: 68 ug/dL (ref 45–182)
Saturation Ratios: 24 % (ref 17.9–39.5)
TIBC: 287 ug/dL (ref 250–450)
UIBC: 219 ug/dL (ref 117–376)

## 2023-05-18 MED ORDER — LANREOTIDE ACETATE 120 MG/0.5ML ~~LOC~~ SOLN
120.0000 mg | Freq: Once | SUBCUTANEOUS | Status: AC
  Administered 2023-05-18: 120 mg via SUBCUTANEOUS
  Filled 2023-05-18: qty 120

## 2023-05-18 NOTE — Progress Notes (Signed)
Hematology and Oncology Follow Up Visit  Brian Mays [id number] 1945/11/11 77 y.o. 05/18/2023   Principle Diagnosis:  Metastatic low grade neuroendocrine tumor-hepatic metastases Iron deficiency anemia   Past Diagnosis:  S/p yttrium-90 intrahepatic therapy - November 2016 Lutathera (Lu 177) injection on 02/01/2018 - s/p cycle 4 SBRT to T6 lesion -- 11/06/2021   Current Therapy:        Somatuline 120 mg monthly IV Iron as indicated  -- Injectafer given on 01/2021   Interim History:  Brian Mays is here today for follow-up and treatment.  Unfortunately, since last time I saw him, his wife has passed away.  She had a glioblastoma.  She really did well with this for such a long time.  He is managing pretty well.  He has a good family to help support him.  He is now back to work part-time.  While she was sick, he really did not work.  Of note, his last Chromogranin A level was down to 136.  He has had no problems with diarrhea.  He has had no cardiac issues.  He has had no change in bowel or bladder habits.  There is been no diarrhea.  He has had no nausea or vomiting.  His last iron studies that were done back in June showed a ferritin of 121 with iron saturation 21%.  He has had no bleeding.  Currently, I would say that his performance status is probably ECOG 1.   Medications:  Allergies as of 05/18/2023       Reactions   Iohexol Hives    Code: HIVES, Desc: PER [name] @ [facility], PT IS ALLERGIC TO CONTRAST DYE 10/02/08/RM  05/01/10...needs full premeds per our protocol w/ [facility]., Onset Date: [id number]   Iodinated Contrast Media Hives        Medication List        Accurate as of May 18, 2023  9:01 AM. If you have any questions, ask your nurse or doctor.          aspirin EC 81 MG tablet Take 81 mg by mouth every morning.   atorvastatin 80 MG tablet Commonly known as: LIPITOR TAKE 1 TABLET BY MOUTH EVERY DAY   Cholecalciferol 25 MCG (1000 UT) tablet Take  1,000 Units by mouth 2 (two) times daily.   clopidogrel 75 MG tablet Commonly known as: PLAVIX TAKE 1 TABLET BY MOUTH EVERY DAY   cyanocobalamin 1000 MCG tablet Commonly known as: VITAMIN B12 Take 1,000 mcg by mouth every other day.   diphenhydramine-acetaminophen 25-500 MG Tabs tablet Commonly known as: TYLENOL PM Take 1 tablet by mouth at bedtime as needed (sleep/pain).   fluticasone 50 MCG/ACT nasal spray Commonly known as: FLONASE Place 2 sprays into both nostrils daily as needed (sinuses).   hydrocortisone 25 MG suppository Commonly known as: ANUSOL-HC INSERT 1 SUPPOSITORY RECTALLY TWICE A DAY AS NEEDED FOR HEMORRHOIDS   ipratropium 0.03 % nasal spray Commonly known as: ATROVENT PLACE 2 SPRAYS INTO THE NOSE 3 (THREE) TIMES DAILY. USE AS NEEDED FOR NASAL DRIP   lansoprazole 30 MG capsule Commonly known as: PREVACID TAKE 1 CAPSULE (30 MG TOTAL) BY MOUTH DAILY AT 12 NOON.   levothyroxine 50 MCG tablet Commonly known as: SYNTHROID Take 1 tablet (50 mcg total) by mouth daily before breakfast.   losartan 50 MG tablet Commonly known as: COZAAR Take 1 tablet (50 mg total) by mouth daily.   metoprolol tartrate 25 MG tablet Commonly known as: LOPRESSOR TAKE 1  TABLET BY MOUTH TWICE A DAY   nitroGLYCERIN 0.4 MG SL tablet Commonly known as: NITROSTAT PLACE 1 TABLET UNDER THE TONGUE EVERY 5 (FIVE) MINUTES X 3 DOSES AS NEEDED FOR CHEST PAIN.   ranolazine 500 MG 12 hr tablet Commonly known as: RANEXA Take 1 tablet (500 mg total) by mouth 2 (two) times daily.   SOMATULINE DEPOT [place] Inject 120 mcg into the skin every 28 (twenty-eight) days. Receives at Dr Gustavo Lah office   SOOTHE XP OP Place 1 drop into both eyes daily as needed (dry eyes).        Allergies:  Allergies  Allergen Reactions   Iohexol Hives     Code: HIVES, Desc: PER [name] @ [facility], PT IS ALLERGIC TO CONTRAST DYE 10/02/08/RM  05/01/10...needs full premeds per our protocol w/ [facility]., Onset Date:  [id number]    Iodinated Contrast Media Hives    Past Medical History, Surgical history, Social history, and Family History were reviewed and updated.  Review of Systems: Review of Systems  Constitutional: Negative.   HENT: Negative.    Eyes: Negative.   Respiratory: Negative.    Cardiovascular: Negative.   Gastrointestinal: Negative.   Genitourinary: Negative.   Musculoskeletal: Negative.   Skin: Negative.   Neurological: Negative.   Endo/Heme/Allergies: Negative.   Psychiatric/Behavioral: Negative.       Physical Exam:  weight is 184 lb (83.5 kg). His oral temperature is 99 F (37.2 C). His blood pressure is 166/60 (abnormal) and his pulse is 64. His respiration is 18 and oxygen saturation is 100%.   Wt Readings from Last 3 Encounters:  05/18/23 184 lb (83.5 kg)  04/18/23 181 lb 12.8 oz (82.5 kg)  03/18/23 183 lb 1.3 oz (83 kg)    Physical Exam Vitals reviewed.  HENT:     Head: Normocephalic and atraumatic.  Eyes:     Pupils: Pupils are equal, round, and reactive to light.  Cardiovascular:     Rate and Rhythm: Normal rate and regular rhythm.     Heart sounds: Normal heart sounds.  Pulmonary:     Effort: Pulmonary effort is normal.     Breath sounds: Normal breath sounds.  Abdominal:     General: Bowel sounds are normal.     Palpations: Abdomen is soft.  Musculoskeletal:        General: No tenderness or deformity. Normal range of motion.     Cervical back: Normal range of motion.  Lymphadenopathy:     Cervical: No cervical adenopathy.  Skin:    General: Skin is warm and dry.     Findings: No erythema or rash.  Neurological:     Mental Status: He is alert and oriented to person, place, and time.  Psychiatric:        Behavior: Behavior normal.        Thought Content: Thought content normal.        Judgment: Judgment normal.      Lab Results  Component Value Date   WBC 4.8 05/18/2023   HGB 11.8 (L) 05/18/2023   HCT 37.5 (L) 05/18/2023   MCV 95.9  05/18/2023   PLT 134 (L) 05/18/2023   Lab Results  Component Value Date   FERRITIN 121 04/18/2023   IRON 62 04/18/2023   TIBC 298 04/18/2023   UIBC 236 04/18/2023   IRONPCTSAT 21 04/18/2023   Lab Results  Component Value Date   RETICCTPCT 1.2 06/14/2022   RBC 3.91 (L) 05/18/2023   No results found for: "  KPAFRELGTCHN", "LAMBDASER", "KAPLAMBRATIO" No results found for: "IGGSERUM", "IGA", "IGMSERUM" No results found for: "TOTALPROTELP", "ALBUMINELP", "A1GS", "A2GS", "BETS", "BETA2SER", "GAMS", "MSPIKE", "SPEI"   Chemistry      Component Value Date/Time   NA 142 05/18/2023 0812   NA 141 06/22/2022 0942   NA 144 10/04/2017 0904   NA 141 07/23/2016 1255   K 4.3 05/18/2023 0812   K 3.8 10/04/2017 0904   K 4.1 07/23/2016 1255   CL 105 05/18/2023 0812   CL 103 10/04/2017 0904   CO2 30 05/18/2023 0812   CO2 28 10/04/2017 0904   CO2 27 07/23/2016 1255   BUN 18 05/18/2023 0812   BUN 20 06/22/2022 0942   BUN 14 10/04/2017 0904   BUN 13.8 07/23/2016 1255   CREATININE 1.11 05/18/2023 0812   CREATININE 0.8 10/04/2017 0904   CREATININE 1.0 07/23/2016 1255      Component Value Date/Time   CALCIUM 9.4 05/18/2023 0812   CALCIUM 9.4 10/04/2017 0904   CALCIUM 9.2 07/23/2016 1255   ALKPHOS 49 05/18/2023 0812   ALKPHOS 64 10/04/2017 0904   ALKPHOS 77 07/23/2016 1255   AST 21 05/18/2023 0812   AST 23 07/23/2016 1255   ALT 12 05/18/2023 0812   ALT 22 10/04/2017 0904   ALT 13 07/23/2016 1255   BILITOT 0.9 05/18/2023 0812   BILITOT 1.13 07/23/2016 1255       Impression and Plan: Brian Mays is a very pleasant 77 yo caucasian gentleman with metastatic low-grade neuroendocrine carcinoma with hepatic metastasis.   So far, everything has been holding pretty steady.  He is doing well with the Somatuline.  We typically follow the Chromogranin A levels as to how he is doing.  I know that he is trying to work on his quality of life.  Again I know he developed himself to his wife for such a  long time.  It sounds like he is going to try to work part-time.  I know he likes to play golf.  He is off to the [employer] for a golf outing today.  We will plan to get him back in 1 month.    Josph Macho, MD 7/17/20249:01 AM

## 2023-05-18 NOTE — Patient Instructions (Signed)
Lanreotide Injection What is this medication? LANREOTIDE (lan REE oh tide) treats high levels of growth hormone (acromegaly). It is used when other therapies have not worked well enough or cannot be tolerated. It works by reducing the amount of growth hormone your body makes. This reduces symptoms and the risk of health problems caused by too much growth hormone, such as diabetes and heart disease. It may also be used to treat neuroendocrine tumors, a cancer of the cells that release hormones and other substances in your body. It works by slowing down the release of these substances from the cells. This slows tumor growth. It also decreases the symptoms of carcinoid syndrome, such as flushing or diarrhea. This medicine may be used for other purposes; ask your health care provider or pharmacist if you have questions. COMMON BRAND NAME(S): Somatuline [place] What should I tell my care team before I take this medication? They need to know if you have any of these conditions: Diabetes Gallbladder disease Heart disease Kidney disease Liver disease Thyroid disease An unusual or allergic reaction to lanreotide, other medications, foods, dyes, or preservatives Pregnant or trying to get pregnant Breast-feeding How should I use this medication? This medication is injected under the skin. It is given by your care team in a hospital or clinic setting. Talk to your care team about the use of this medication in children. Special care may be needed. Overdosage: If you think you have taken too much of this medicine contact a poison control center or emergency room at once. NOTE: This medicine is only for you. Do not share this medicine with others. What if I miss a dose? Keep appointments for follow-up doses. It is important not to miss your dose. Call your care team if you are unable to keep an appointment. What may interact with this medication? Bromocriptine Cyclosporine Certain medications for blood  pressure, heart disease, irregular heartbeat Certain medications for diabetes Quinidine Terfenadine This list may not describe all possible interactions. Give your health care provider a list of all the medicines, herbs, non-prescription drugs, or dietary supplements you use. Also tell them if you smoke, drink alcohol, or use illegal drugs. Some items may interact with your medicine. What should I watch for while using this medication? Visit your care team for regular checks on your progress. Tell your care team if your symptoms do not start to get better or if they get worse. Your condition will be monitored carefully while you are receiving this medication. You may need blood work while you are taking this medication. This medication may increase blood sugar. The risk may be higher in patients who already have diabetes. Ask your care team what you can do to lower your risk of diabetes while taking this medication. Talk to your care team if you wish to become pregnant or think you may be pregnant. This medication can cause serious birth defects. Do not breast-feed while taking this medication and for 6 months after stopping therapy. This medication may cause infertility. Talk to your care team if you are concerned about your fertility. What side effects may I notice from receiving this medication? Side effects that you should report to your care team as soon as possible: Allergic reactions--skin rash, itching, hives, swelling of the face, lips, tongue, or throat Gallbladder problems--severe stomach pain, nausea, vomiting, fever High blood sugar (hyperglycemia)--increased thirst or amount of urine, unusual weakness or fatigue, blurry vision Increase in blood pressure Low blood sugar (hypoglycemia)--tremors or shaking, anxiety, sweating, cold   or clammy skin, confusion, dizziness, rapid heartbeat Low thyroid levels (hypothyroidism)--unusual weakness or fatigue, increased sensitivity to cold,  constipation, hair loss, dry skin, weight gain, feelings of depression Slow heartbeat--dizziness, feeling faint or lightheaded, confusion, trouble breathing, unusual weakness or fatigue Side effects that usually do not require medical attention (report to your care team if they continue or are bothersome): Diarrhea Dizziness Headache Muscle spasms Nausea Pain, redness, irritation, or bruising at the injection site Stomach pain This list may not describe all possible side effects. Call your doctor for medical advice about side effects. You may report side effects to FDA at 1-800-FDA-1088. Where should I keep my medication? This medication is given in a hospital or clinic. It will not be stored at home. NOTE: This sheet is a summary. It may not cover all possible information. If you have questions about this medicine, talk to your doctor, pharmacist, or health care provider.  2024 Elsevier/Gold Standard (2022-03-10 00:00:00)  

## 2023-05-19 DIAGNOSIS — R69 Illness, unspecified: Secondary | ICD-10-CM | POA: Diagnosis not present

## 2023-05-19 LAB — CA 125: Cancer Antigen (CA) 125: 14 U/mL

## 2023-05-20 DIAGNOSIS — R69 Illness, unspecified: Secondary | ICD-10-CM | POA: Diagnosis not present

## 2023-05-24 ENCOUNTER — Other Ambulatory Visit: Payer: Self-pay | Admitting: Cardiovascular Disease

## 2023-05-24 DIAGNOSIS — I10 Essential (primary) hypertension: Secondary | ICD-10-CM

## 2023-05-27 ENCOUNTER — Other Ambulatory Visit: Payer: Self-pay | Admitting: Family Medicine

## 2023-05-27 DIAGNOSIS — J3489 Other specified disorders of nose and nasal sinuses: Secondary | ICD-10-CM

## 2023-05-31 ENCOUNTER — Ambulatory Visit (INDEPENDENT_AMBULATORY_CARE_PROVIDER_SITE_OTHER): Payer: Medicare HMO | Admitting: Emergency Medicine

## 2023-05-31 VITALS — Ht 70.5 in | Wt 178.0 lb

## 2023-05-31 DIAGNOSIS — Z0000 Encounter for general adult medical examination without abnormal findings: Secondary | ICD-10-CM

## 2023-05-31 NOTE — Progress Notes (Signed)
Subjective:   Brian Mays is a 77 y.o. male who presents for Medicare Annual/Subsequent preventive examination.  Visit Complete: Virtual  I connected with  Brian Mays on 05/31/23 by a audio enabled telemedicine application and verified that I am speaking with the correct person using two identifiers.  Patient Location: Home  Provider Location: Home Office  I discussed the limitations of evaluation and management by telemedicine. The patient expressed understanding and agreed to proceed.  Vital Signs: Unable to obtain new vitals due to this being a telehealth visit.   Patient Medicare AWV questionnaire was completed by the patient on 05/28/23; I have confirmed that all information answered by patient is correct and no changes since this date.  Review of Systems     Cardiac Risk Factors include: advanced age (>33men, >74 women);male gender;hypertension;Other (see comment), Risk factor comments: known CAD     Objective:    Today's Vitals   05/31/23 0925  Weight: 178 lb (80.7 kg)  Height: 5' 10.5" (1.791 m)   Body mass index is 25.18 kg/m.     05/31/2023    9:37 AM 05/18/2023    8:31 AM 04/18/2023    1:35 PM 03/21/2023   10:14 AM 03/18/2023    1:39 PM 01/20/2023   11:01 AM 12/16/2022   11:12 AM  Advanced Directives  Does Patient Have a Medical Advance Directive? Yes Yes Yes Yes Yes Yes Yes  Type of Estate agent of [place];Living will Living will;Healthcare Power of Attorney Living will;Healthcare Power of Attorney Living will;Healthcare Power of Attorney Living will;Healthcare Power of Attorney Living will;Healthcare Power of Attorney Living will;Healthcare Power of Attorney  Does patient want to make changes to medical advance directive? No - Patient declined     No - Patient declined   Copy of Healthcare Power of Attorney in Chart? No - copy requested  No - copy requested  No - copy requested No - copy requested   Would patient like information on  creating a medical advance directive?   No - Patient declined  No - Patient declined No - Patient declined     Current Medications (verified) Outpatient Encounter Medications as of 05/31/2023  Medication Sig   Artificial Tear Solution (SOOTHE XP OP) Place 1 drop into both eyes daily as needed (dry eyes).   aspirin EC 81 MG tablet Take 81 mg by mouth every morning.   atorvastatin (LIPITOR) 80 MG tablet TAKE 1 TABLET BY MOUTH EVERY DAY   Cholecalciferol 1000 UNITS tablet Take 1,000 Units by mouth 2 (two) times daily.   clopidogrel (PLAVIX) 75 MG tablet TAKE 1 TABLET BY MOUTH EVERY DAY   diphenhydramine-acetaminophen (TYLENOL PM) 25-500 MG TABS tablet Take 1 tablet by mouth at bedtime as needed (sleep/pain).   fluticasone (FLONASE) 50 MCG/ACT nasal spray Place 2 sprays into both nostrils daily as needed (sinuses).   hydrocortisone (ANUSOL-HC) 25 MG suppository INSERT 1 SUPPOSITORY RECTALLY TWICE A DAY AS NEEDED FOR HEMORRHOIDS   ipratropium (ATROVENT) 0.03 % nasal spray Place 2 sprays into the nose 3 (three) times daily. Use as needed for nasal drip   Lanreotide Acetate (SOMATULINE DEPOT [place]) Inject 120 mcg into the skin every 28 (twenty-eight) days. Receives at Dr Gustavo Lah office   lansoprazole (PREVACID) 30 MG capsule TAKE 1 CAPSULE (30 MG TOTAL) BY MOUTH DAILY AT 12 NOON.   levothyroxine (SYNTHROID) 50 MCG tablet Take 1 tablet (50 mcg total) by mouth daily before breakfast.   losartan (COZAAR) 50 MG  tablet Take 1 tablet (50 mg total) by mouth daily.   metoprolol tartrate (LOPRESSOR) 25 MG tablet TAKE 0.5 TABLETS BY MOUTH 2 TIMES DAILY.   nitroGLYCERIN (NITROSTAT) 0.4 MG SL tablet PLACE 1 TABLET UNDER THE TONGUE EVERY 5 (FIVE) MINUTES X 3 DOSES AS NEEDED FOR CHEST PAIN.   ranolazine (RANEXA) 500 MG 12 hr tablet Take 1 tablet (500 mg total) by mouth 2 (two) times daily.   vitamin B-12 (CYANOCOBALAMIN) 1000 MCG tablet Take 1,000 mcg by mouth every other day.   No facility-administered encounter  medications on file as of 05/31/2023.    Allergies (verified) Iohexol and Iodinated contrast media   History: Past Medical History:  Diagnosis Date   Anemia    in past   Cataract    Colon polyps    Diverticulosis    Gallstones    GERD (gastroesophageal reflux disease)    Heart attack (HCC)    mild, Spring 2017   Heart murmur    Hemorrhoid    Hiatal hernia    HTN (hypertension)    Hyperlipidemia    Hypothyroidism    Iron deficiency anemia due to chronic blood loss 03/29/2019   Iron malabsorption 03/29/2019   Metastatic carcinoma (HCC) 2010   Dr Twanna Hy   Neuroendocrine cancer [facility]) 08/2021   Pancreatitis 1998   chronic   Past Surgical History:  Procedure Laterality Date   APPENDECTOMY  1962   CARDIAC CATHETERIZATION N/A 02/10/2016   Procedure: Left Heart Cath and Coronary Angiography;  Surgeon: Yates Decamp, MD;  Location: [facility] INVASIVE CV LAB;  Service: Cardiovascular;  Laterality: N/A;   CARDIAC CATHETERIZATION  02/10/2016   Procedure: Coronary/Graft Atherectomy;  Surgeon: Yates Decamp, MD;  Location: [facility] INVASIVE CV LAB;  Service: Cardiovascular;;   CARDIAC CATHETERIZATION  02/10/2016   Procedure: Coronary Stent Intervention;  Surgeon: Yates Decamp, MD;  Location: [facility] INVASIVE CV LAB;  Service: Cardiovascular;;   CATARACT EXTRACTION W/ INTRAOCULAR LENS  IMPLANT, BILATERAL Bilateral    CORONARY STENT INTERVENTION N/A 12/16/2021   Procedure: CORONARY STENT INTERVENTION;  Surgeon: Orbie Pyo, MD;  Location: [facility] INVASIVE CV LAB;  Service: Cardiovascular;  Laterality: N/A;   IR GENERIC HISTORICAL  12/30/2015   IR RADIOLOGIST EVAL & MGMT 12/30/2015 Irish Lack, MD [facility] INTERV RAD   IR GENERIC HISTORICAL  11/09/2016   IR RADIOLOGIST EVAL & MGMT 11/09/2016 Irish Lack, MD [facility] INTERV RAD   IR RADIOLOGIST EVAL & MGMT  03/01/2017   IR RADIOLOGIST EVAL & MGMT  06/29/2017   LAPAROSCOPIC CHOLECYSTECTOMY  1999   LEFT HEART CATH AND CORONARY ANGIOGRAPHY N/A 12/16/2021   Procedure: LEFT HEART  CATH AND CORONARY ANGIOGRAPHY;  Surgeon: Orbie Pyo, MD;  Location: [facility] INVASIVE CV LAB;  Service: Cardiovascular;  Laterality: N/A;   LIVER BIOPSY  2010   RADIOACTIVE SEED IMPLANT  X 3   "to my liver"   TUMOR EXCISION  01/2009   Carcinoil Resection    TUMOR REMOVAL     from small intestine   Family History  Problem Relation Age of Onset   Kidney disease Mother    Hyperlipidemia Mother    Hypertension Mother    COPD Father    Ulcerative colitis Daughter    Colon cancer Neg Hx    Esophageal cancer Neg Hx    Rectal cancer Neg Hx    Stomach cancer Neg Hx    Social History   Socioeconomic History   Marital status: Widowed    Spouse name: Not on file  Number of children: 1   Years of education: Not on file   Highest education level: Associate degree: academic program  Occupational History   Occupation: Investment banker, corporate: [employer]  Tobacco Use   Smoking status: Former    Current packs/day: 0.00    Average packs/day: 1.5 packs/day for 38.2 years (57.4 ttl pk-yrs)    Types: Cigarettes    Start date: 09/25/1959    Quit date: 12/21/1997    Years since quitting: 25.4   Smokeless tobacco: Never  Vaping Use   Vaping status: Never Used  Substance and Sexual Activity   Alcohol use: Yes    Alcohol/week: 3.0 - 4.0 standard drinks of alcohol    Types: 3 - 4 Cans of beer per week    Comment: 1-2 beers twice a week   Drug use: No   Sexual activity: Yes  Other Topics Concern   Not on file  Social History Narrative   Regular Exercise -  3 days a week x 90 min    Social Determinants of Health   Financial Resource Strain: Low Risk  (05/28/2023)   Overall Financial Resource Strain (CARDIA)    Difficulty of Paying Living Expenses: Not hard at all  Food Insecurity: No Food Insecurity (05/28/2023)   Hunger Vital Sign    Worried About Running Out of Food in the Last Year: Never true    Ran Out of Food in the Last Year: Never true  Transportation Needs: No  Transportation Needs (05/28/2023)   PRAPARE - Administrator, Civil Service (Medical): No    Lack of Transportation (Non-Medical): No  Physical Activity: Sufficiently Active (05/28/2023)   Exercise Vital Sign    Days of Exercise per Week: 3 days    Minutes of Exercise per Session: 130 min  Stress: No Stress Concern Present (05/28/2023)   [employer] of Occupational Health - Occupational Stress Questionnaire    Feeling of Stress : Not at all  Social Connections: Socially Isolated (05/28/2023)   Social Connection and Isolation Panel [NHANES]    Frequency of Communication with Friends and Family: More than three times a week    Frequency of Social Gatherings with Friends and Family: Twice a week    Attends Religious Services: Never    Database administrator or Organizations: No    Attends Banker Meetings: Never    Marital Status: Widowed    Tobacco Counseling Counseling given: Not Answered   Clinical Intake:  Pre-visit preparation completed: Yes  Pain : No/denies pain     BMI - recorded: 25.18 Nutritional Status: BMI 25 -29 Overweight Nutritional Risks: None Diabetes: No  How often do you need to have someone help you when you read instructions, pamphlets, or other written materials from your doctor or pharmacy?: 1 - Never  Interpreter Needed?: No  Information entered by :: Tora Kindred, CMA   Activities of Daily Living    05/28/2023   12:21 PM  In your present state of health, do you have any difficulty performing the following activities:  Hearing? 1  Comment wears hearing aids  Vision? 0  Difficulty concentrating or making decisions? 0  Walking or climbing stairs? 0  Dressing or bathing? 0  Doing errands, shopping? 0  Preparing Food and eating ? N  Using the Toilet? N  In the past six months, have you accidently leaked urine? N  Do you have problems with loss of bowel control? N  Managing  your Medications? N  Managing your  Finances? N  Housekeeping or managing your Housekeeping? N    Patient Care Team: Copland, Gwenlyn Found, MD as PCP - General (Family Medicine) Nahser, Deloris Ping, MD as PCP - Cardiology (Cardiology) Myna Hidalgo Rose Phi, MD (Internal Medicine) Hilarie Fredrickson, MD (Gastroenterology)  Indicate any recent Medical Services you may have received from other than [facility] providers in the past year (date may be approximate).     Assessment:   This is a routine wellness examination for Jamarrius.  Hearing/Vision screen Hearing Screening - Comments:: Wears hearing aids  Dietary issues and exercise activities discussed:     Goals Addressed               This Visit's Progress     COMPLETED: Patient Stated        Increase exercise, eat healthier & lose some weight      Patient Stated (pt-stated)        Exercise more, eat healthier and losing a little weight      Depression Screen    05/31/2023    9:34 AM 09/08/2022    1:57 PM 02/22/2022    9:40 AM 09/03/2021    8:52 AM 07/10/2020   10:48 AM 06/15/2017    2:50 PM 05/24/2016    9:29 AM  PHQ 2/9 Scores  PHQ - 2 Score 0 0 0 0 0 0 0  PHQ- 9 Score 0        Exception Documentation      Patient refusal     Fall Risk    05/28/2023   12:21 PM 09/08/2022    1:57 PM 02/22/2022    9:38 AM 09/03/2021    8:52 AM 07/10/2020   10:47 AM  Fall Risk   Falls in the past year? 0 0 0 0 0  Number falls in past yr: 0 0 0 0   Injury with Fall? 0 0 0 0   Risk for fall due to : No Fall Risks   No Fall Risks   Follow up Falls prevention discussed Falls evaluation completed Falls prevention discussed Falls evaluation completed     MEDICARE RISK AT HOME:   TIMED UP AND GO:  Was the test performed?  No    Cognitive Function:        05/31/2023    9:39 AM  6CIT Screen  What Year? 0 points  What month? 0 points  What time? 0 points  Count back from 20 0 points  Months in reverse 0 points  Repeat phrase 0 points  Total Score 0 points     Immunizations Immunization History  Administered Date(s) Administered   Fluad Quad(high Dose 65+) 09/03/2021, 09/08/2022   H1N1 10/04/2008   Influenza Split 09/30/2011, 08/04/2012   Influenza, High Dose Seasonal PF 06/23/2017   Influenza,inj,Quad PF,6+ Mos 09/28/2013, 07/26/2014, 07/14/2015, 07/23/2016   PFIZER Comirnaty(Gray Top)Covid-19 Tri-Sucrose Vaccine 03/23/2021   PFIZER(Purple Top)SARS-COV-2 Vaccination 12/15/2019, 01/09/2020, 10/18/2020   Pneumococcal Conjugate-13 03/06/2015   Pneumococcal Polysaccharide-23 03/30/2011, 07/10/2020   Td 03/06/2015   Tdap 10/16/2018   Zoster Recombinant(Shingrix) 10/20/2020, 12/30/2020    TDAP status: Up to date  Flu Vaccine status: Up to date  Pneumococcal vaccine status: Up to date  Covid-19 vaccine status: Declined, Education has been provided regarding the importance of this vaccine but patient still declined. Advised may receive this vaccine at local pharmacy or Health Dept.or vaccine clinic. Aware to provide a copy of the vaccination record if obtained from  local pharmacy or Health Dept. Verbalized acceptance and understanding.  Qualifies for Shingles Vaccine? Yes   Zostavax completed No   Shingrix Completed?: Yes  Screening Tests Health Maintenance  Topic Date Due   COVID-19 Vaccine (5 - 2023-24 season) 07/02/2022   INFLUENZA VACCINE  06/02/2023   Medicare Annual Wellness (AWV)  05/30/2024   DTaP/Tdap/Td (3 - Td or Tdap) 10/16/2028   Pneumonia Vaccine 28+ Years old  Completed   Hepatitis C Screening  Completed   Zoster Vaccines- Shingrix  Completed   HPV VACCINES  Aged Out   Colonoscopy  Discontinued    Health Maintenance  Health Maintenance Due  Topic Date Due   COVID-19 Vaccine (5 - 2023-24 season) 07/02/2022    Colorectal cancer screening: No longer required.   Lung Cancer Screening: (Low Dose CT Chest recommended if Age 11-80 years, 20 pack-year currently smoking OR have quit w/in 15years.) does not qualify.    Lung Cancer Screening Referral: n/a  Additional Screening:  Hepatitis C Screening: does not qualify; Completed 06/23/17  Vision Screening: Recommended annual ophthalmology exams for early detection of glaucoma and other disorders of the eye.  Dental Screening: Recommended annual dental exams for proper oral hygiene  Community Resource Referral / Chronic Care Management: CRR required this visit?  No   CCM required this visit?  No     Plan:     I have personally reviewed and noted the following in the patient's chart:   Medical and social history Use of alcohol, tobacco or illicit drugs  Current medications and supplements including opioid prescriptions. Patient is not currently taking opioid prescriptions. Functional ability and status Nutritional status Physical activity Advanced directives List of other physicians Hospitalizations, surgeries, and ER visits in previous 12 months Vitals Screenings to include cognitive, depression, and falls Referrals and appointments  In addition, I have reviewed and discussed with patient certain preventive protocols, quality metrics, and best practice recommendations. A written personalized care plan for preventive services as well as general preventive health recommendations were provided to patient.     Tora Kindred, CMA   05/31/2023   After Visit Summary: (MyChart) Due to this being a telephonic visit, the after visit summary with patients personalized plan was offered to patient via MyChart   Nurse Notes: Patient doing well.

## 2023-05-31 NOTE — Patient Instructions (Addendum)
Brian Mays , Thank you for taking time to come for your Medicare Wellness Visit. I appreciate your ongoing commitment to your health goals. Please review the following plan we discussed and let me know if I can assist you in the future.   Referrals/Orders/Follow-Ups/Clinician Recommendations: Make appointment with Dr. Patsy Lager as discussed.  This is a list of the screening recommended for you and due dates:  Health Maintenance  Topic Date Due   COVID-19 Vaccine (5 - 2023-24 season) 07/02/2022   Flu Shot  06/02/2023   Medicare Annual Wellness Visit  05/30/2024   DTaP/Tdap/Td vaccine (3 - Td or Tdap) 10/16/2028   Pneumonia Vaccine  Completed   Hepatitis C Screening  Completed   Zoster (Shingles) Vaccine  Completed   HPV Vaccine  Aged Out   Colon Cancer Screening  Discontinued    Advanced directives: (Copy Requested) Please bring a copy of your health care power of attorney and living will to the office to be added to your chart at your convenience.  Next Medicare Annual Wellness Visit scheduled for next year: Yes 06/01/24 @ 9am  Preventive Care 65 Years and Older, Male Preventive care refers to lifestyle choices and visits with your health care provider that can promote health and wellness. What does preventive care include? A yearly physical exam. This is also called an annual well check. Dental exams once or twice a year. Routine eye exams. Ask your health care provider how often you should have your eyes checked. Personal lifestyle choices, including: Daily care of your teeth and gums. Regular physical activity. Eating a healthy diet. Avoiding tobacco and drug use. Limiting alcohol use. Practicing safe sex. Taking low-dose aspirin every day. Taking vitamin and mineral supplements as recommended by your health care provider. What happens during an annual well check? The services and screenings done by your health care provider during your annual well check will depend on your age,  overall health, lifestyle risk factors, and family history of disease. Counseling  Your health care provider may ask you questions about your: Alcohol use. Tobacco use. Drug use. Emotional well-being. Home and relationship well-being. Sexual activity. Eating habits. History of falls. Memory and ability to understand (cognition). Work and work Astronomer. Reproductive health. Screening  You may have the following tests or measurements: Height, weight, and BMI. Blood pressure. Lipid and cholesterol levels. These may be checked every 5 years, or more frequently if you are over 20 years old. Skin check. Lung cancer screening. You may have this screening every year starting at age 37 if you have a 30-pack-year history of smoking and currently smoke or have quit within the past 15 years. Fecal occult blood test (FOBT) of the stool. You may have this test every year starting at age 78. Flexible sigmoidoscopy or colonoscopy. You may have a sigmoidoscopy every 5 years or a colonoscopy every 10 years starting at age 16. Hepatitis C blood test. Hepatitis B blood test. Sexually transmitted disease (STD) testing. Diabetes screening. This is done by checking your blood sugar (glucose) after you have not eaten for a while (fasting). You may have this done every 1-3 years. Bone density scan. This is done to screen for osteoporosis. You may have this done starting at age 30. Mammogram. This may be done every 1-2 years. Talk to your health care provider about how often you should have regular mammograms. Talk with your health care provider about your test results, treatment options, and if necessary, the need for more tests. Vaccines  Your health care provider may recommend certain vaccines, such as: Influenza vaccine. This is recommended every year. Tetanus, diphtheria, and acellular pertussis (Tdap, Td) vaccine. You may need a Td booster every 10 years. Zoster vaccine. You may need this after age  62. Pneumococcal 13-valent conjugate (PCV13) vaccine. One dose is recommended after age 71. Pneumococcal polysaccharide (PPSV23) vaccine. One dose is recommended after age 40. Talk to your health care provider about which screenings and vaccines you need and how often you need them. This information is not intended to replace advice given to you by your health care provider. Make sure you discuss any questions you have with your health care provider. Document Released: 11/14/2015 Document Revised: 07/07/2016 Document Reviewed: 08/19/2015 Elsevier Interactive Patient Education  2017 [employer].  Fall Prevention in the Home Falls can cause injuries. They can happen to people of all ages. There are many things you can do to make your home safe and to help prevent falls. What can I do on the outside of my home? Regularly fix the edges of walkways and driveways and fix any cracks. Remove anything that might make you trip as you walk through a door, such as a raised step or threshold. Trim any bushes or trees on the path to your home. Use bright outdoor lighting. Clear any walking paths of anything that might make someone trip, such as rocks or tools. Regularly check to see if handrails are loose or broken. Make sure that both sides of any steps have handrails. Any raised decks and porches should have guardrails on the edges. Have any leaves, snow, or ice cleared regularly. Use sand or salt on walking paths during winter. Clean up any spills in your garage right away. This includes oil or grease spills. What can I do in the bathroom? Use night lights. Install grab bars by the toilet and in the tub and shower. Do not use towel bars as grab bars. Use non-skid mats or decals in the tub or shower. If you need to sit down in the shower, use a plastic, non-slip stool. Keep the floor dry. Clean up any water that spills on the floor as soon as it happens. Remove soap buildup in the tub or shower  regularly. Attach bath mats securely with double-sided non-slip rug tape. Do not have throw rugs and other things on the floor that can make you trip. What can I do in the bedroom? Use night lights. Make sure that you have a light by your bed that is easy to reach. Do not use any sheets or blankets that are too big for your bed. They should not hang down onto the floor. Have a firm chair that has side arms. You can use this for support while you get dressed. Do not have throw rugs and other things on the floor that can make you trip. What can I do in the kitchen? Clean up any spills right away. Avoid walking on wet floors. Keep items that you use a lot in easy-to-reach places. If you need to reach something above you, use a strong step stool that has a grab bar. Keep electrical cords out of the way. Do not use floor polish or wax that makes floors slippery. If you must use wax, use non-skid floor wax. Do not have throw rugs and other things on the floor that can make you trip. What can I do with my stairs? Do not leave any items on the stairs. Make sure that there are  handrails on both sides of the stairs and use them. Fix handrails that are broken or loose. Make sure that handrails are as long as the stairways. Check any carpeting to make sure that it is firmly attached to the stairs. Fix any carpet that is loose or worn. Avoid having throw rugs at the top or bottom of the stairs. If you do have throw rugs, attach them to the floor with carpet tape. Make sure that you have a light switch at the top of the stairs and the bottom of the stairs. If you do not have them, ask someone to add them for you. What else can I do to help prevent falls? Wear shoes that: Do not have high heels. Have rubber bottoms. Are comfortable and fit you well. Are closed at the toe. Do not wear sandals. If you use a stepladder: Make sure that it is fully opened. Do not climb a closed stepladder. Make sure that  both sides of the stepladder are locked into place. Ask someone to hold it for you, if possible. Clearly mark and make sure that you can see: Any grab bars or handrails. First and last steps. Where the edge of each step is. Use tools that help you move around (mobility aids) if they are needed. These include: Canes. Walkers. Scooters. Crutches. Turn on the lights when you go into a dark area. Replace any light bulbs as soon as they burn out. Set up your furniture so you have a clear path. Avoid moving your furniture around. If any of your floors are uneven, fix them. If there are any pets around you, be aware of where they are. Review your medicines with your doctor. Some medicines can make you feel dizzy. This can increase your chance of falling. Ask your doctor what other things that you can do to help prevent falls. This information is not intended to replace advice given to you by your health care provider. Make sure you discuss any questions you have with your health care provider. Document Released: 08/14/2009 Document Revised: 03/25/2016 Document Reviewed: 11/22/2014 Elsevier Interactive Patient Education  2017 [employer].

## 2023-06-14 ENCOUNTER — Other Ambulatory Visit: Payer: Self-pay | Admitting: Cardiovascular Disease

## 2023-06-14 NOTE — Telephone Encounter (Signed)
Pt's medication was sent to pt's pharmacy as requested. Confirmation received.  °

## 2023-06-15 ENCOUNTER — Encounter: Payer: Self-pay | Admitting: Hematology & Oncology

## 2023-06-15 ENCOUNTER — Telehealth: Payer: Self-pay | Admitting: *Deleted

## 2023-06-15 ENCOUNTER — Inpatient Hospital Stay: Payer: Medicare HMO

## 2023-06-15 ENCOUNTER — Inpatient Hospital Stay: Payer: Medicare HMO | Admitting: Hematology & Oncology

## 2023-06-15 ENCOUNTER — Other Ambulatory Visit: Payer: Self-pay

## 2023-06-15 ENCOUNTER — Inpatient Hospital Stay: Payer: Medicare HMO | Attending: Hematology & Oncology

## 2023-06-15 VITALS — BP 139/58 | HR 60 | Temp 98.0°F | Resp 18 | Ht 70.5 in | Wt 184.0 lb

## 2023-06-15 DIAGNOSIS — C7A8 Other malignant neuroendocrine tumors: Secondary | ICD-10-CM | POA: Diagnosis not present

## 2023-06-15 DIAGNOSIS — C7B8 Other secondary neuroendocrine tumors: Secondary | ICD-10-CM

## 2023-06-15 DIAGNOSIS — R69 Illness, unspecified: Secondary | ICD-10-CM | POA: Diagnosis not present

## 2023-06-15 DIAGNOSIS — D500 Iron deficiency anemia secondary to blood loss (chronic): Secondary | ICD-10-CM

## 2023-06-15 DIAGNOSIS — D509 Iron deficiency anemia, unspecified: Secondary | ICD-10-CM | POA: Insufficient documentation

## 2023-06-15 LAB — CMP (CANCER CENTER ONLY)
ALT: 14 U/L (ref 0–44)
AST: 23 U/L (ref 15–41)
Albumin: 3.8 g/dL (ref 3.5–5.0)
Alkaline Phosphatase: 54 U/L (ref 38–126)
Anion gap: 5 (ref 5–15)
BUN: 20 mg/dL (ref 8–23)
CO2: 32 mmol/L (ref 22–32)
Calcium: 9.2 mg/dL (ref 8.9–10.3)
Chloride: 103 mmol/L (ref 98–111)
Creatinine: 1.13 mg/dL (ref 0.61–1.24)
GFR, Estimated: >60 mL/min (ref >60)
Glucose, Bld: 125 mg/dL — ABNORMAL HIGH (ref 70–99)
Potassium: 4.3 mmol/L (ref 3.5–5.1)
Sodium: 140 mmol/L (ref 135–145)
Total Bilirubin: 0.9 mg/dL (ref 0.3–1.2)
Total Protein: 7.1 g/dL (ref 6.5–8.1)

## 2023-06-15 LAB — CBC WITH DIFFERENTIAL (CANCER CENTER ONLY)
Abs Immature Granulocytes: 0.02 10*3/uL (ref 0.00–0.07)
Basophils Absolute: 0.1 10*3/uL (ref 0.0–0.1)
Basophils Relative: 1 %
Eosinophils Absolute: 0.3 10*3/uL (ref 0.0–0.5)
Eosinophils Relative: 7 %
HCT: 36.5 % — ABNORMAL LOW (ref 39.0–52.0)
Hemoglobin: 11.6 g/dL — ABNORMAL LOW (ref 13.0–17.0)
Immature Granulocytes: 0 %
Lymphocytes Relative: 12 %
Lymphs Abs: 0.6 10*3/uL — ABNORMAL LOW (ref 0.7–4.0)
MCH: 30.1 pg (ref 26.0–34.0)
MCHC: 31.8 g/dL (ref 30.0–36.0)
MCV: 94.6 fL (ref 80.0–100.0)
Monocytes Absolute: 0.3 10*3/uL (ref 0.1–1.0)
Monocytes Relative: 5 %
Neutro Abs: 3.9 10*3/uL (ref 1.7–7.7)
Neutrophils Relative %: 75 %
Platelet Count: 146 10*3/uL — ABNORMAL LOW (ref 150–400)
RBC: 3.86 MIL/uL — ABNORMAL LOW (ref 4.22–5.81)
RDW: 14.1 % (ref 11.5–15.5)
WBC Count: 5.2 10*3/uL (ref 4.0–10.5)
nRBC: 0 % (ref 0.0–0.2)

## 2023-06-15 LAB — IRON AND IRON BINDING CAPACITY (CC-WL,HP ONLY)
Iron: 51 ug/dL (ref 45–182)
Saturation Ratios: 17 % — ABNORMAL LOW (ref 17.9–39.5)
TIBC: 301 ug/dL (ref 250–450)
UIBC: 250 ug/dL (ref 117–376)

## 2023-06-15 LAB — FERRITIN: Ferritin: 114 ng/mL (ref 24–336)

## 2023-06-15 LAB — LACTATE DEHYDROGENASE: LDH: 190 U/L (ref 98–192)

## 2023-06-15 MED ORDER — LANREOTIDE ACETATE 120 MG/0.5ML ~~LOC~~ SOLN
120.0000 mg | Freq: Once | SUBCUTANEOUS | Status: AC
  Administered 2023-06-15: 120 mg via SUBCUTANEOUS
  Filled 2023-06-15: qty 120

## 2023-06-15 NOTE — Telephone Encounter (Signed)
-----   Message from Josph Macho sent at 06/15/2023  1:02 PM EDT ----- Please call and let him know that the iron level is actually on the lower side.  We need to give him a dose of IV iron.  Please set this up.  Cindee Lame

## 2023-06-15 NOTE — Progress Notes (Signed)
Hematology and Oncology Follow Up Visit  Brian Mays [id number] 1945-11-18 77 y.o. 06/15/2023   Principle Diagnosis:  Metastatic low grade neuroendocrine tumor-hepatic metastases Iron deficiency anemia   Past Diagnosis:  S/p yttrium-90 intrahepatic therapy - November 2016 Lutathera (Lu 177) injection on 02/01/2018 - s/p cycle 4 SBRT to T6 lesion -- 11/06/2021   Current Therapy:        Somatuline 120 mg monthly IV Iron as indicated  -- Injectafer given on 01/2021   Interim History:  Brian Mays is here today for follow-up and treatment.  He is doing okay.  He really has no specific complaints.  I do not think he has working all that much now.  [place]  His last Chromogranin A level was 136.  This is holding steady.  He has had no problems with diarrhea.  He has had no issues with his heart.  His blood pressure has been fluctuating a little bit.  His last iron studies back in July showed a ferritin of 103 with an iron saturation of 24%.  He has had no bleeding.  There is been no leg swelling.  Overall, I would have said that his performance status is probably ECOG 1.   Medications:  Allergies as of 06/15/2023       Reactions   Iohexol Hives    Code: HIVES, Desc: PER [name] @ [facility], PT IS ALLERGIC TO CONTRAST DYE 10/02/08/RM  05/01/10...needs full premeds per our protocol w/ [facility]., Onset Date: [id number]   Iodinated Contrast Media Hives        Medication List        Accurate as of June 15, 2023  9:50 AM. If you have any questions, ask your nurse or doctor.          aspirin EC 81 MG tablet Take 81 mg by mouth every morning.   atorvastatin 80 MG tablet Commonly known as: LIPITOR TAKE 1 TABLET BY MOUTH EVERY DAY   Cholecalciferol 25 MCG (1000 UT) tablet Take 1,000 Units by mouth 2 (two) times daily.   clopidogrel 75 MG tablet Commonly known as: PLAVIX TAKE 1 TABLET BY MOUTH EVERY DAY    cyanocobalamin 1000 MCG tablet Commonly known as: VITAMIN B12 Take 1,000 mcg by mouth every other day.   diphenhydramine-acetaminophen 25-500 MG Tabs tablet Commonly known as: TYLENOL PM Take 1 tablet by mouth at bedtime as needed (sleep/pain).   fluticasone 50 MCG/ACT nasal spray Commonly known as: FLONASE Place 2 sprays into both nostrils daily as needed (sinuses).   hydrocortisone 25 MG suppository Commonly known as: ANUSOL-HC INSERT 1 SUPPOSITORY RECTALLY TWICE A DAY AS NEEDED FOR HEMORRHOIDS   ipratropium 0.03 % nasal spray Commonly known as: ATROVENT Place 2 sprays into the nose 3 (three) times daily. Use as needed for nasal drip   lansoprazole 30 MG capsule Commonly known as: PREVACID TAKE 1 CAPSULE (30 MG TOTAL) BY MOUTH DAILY AT 12 NOON.   levothyroxine 50 MCG tablet Commonly known as: SYNTHROID Take 1 tablet (50 mcg total) by mouth daily before breakfast.   losartan 50 MG tablet Commonly known as: COZAAR TAKE 1 TABLET BY MOUTH EVERY DAY   metoprolol tartrate 25 MG tablet Commonly known as: LOPRESSOR TAKE 0.5 TABLETS BY MOUTH 2 TIMES DAILY.   nitroGLYCERIN 0.4 MG SL tablet Commonly known as: NITROSTAT PLACE 1 TABLET UNDER THE TONGUE EVERY 5 (FIVE) MINUTES X 3 DOSES AS NEEDED FOR  CHEST PAIN.   ranolazine 500 MG 12 hr tablet Commonly known as: RANEXA Take 1 tablet (500 mg total) by mouth 2 (two) times daily.   SOMATULINE DEPOT [place] Inject 120 mcg into the skin every 28 (twenty-eight) days. Receives at Dr Gustavo Lah office   SOOTHE XP OP Place 1 drop into both eyes daily as needed (dry eyes).        Allergies:  Allergies  Allergen Reactions   Iohexol Hives     Code: HIVES, Desc: PER [name] @ [facility], PT IS ALLERGIC TO CONTRAST DYE 10/02/08/RM  05/01/10...needs full premeds per our protocol w/ [facility]., Onset Date: [id number]    Iodinated Contrast Media Hives    Past Medical History, Surgical history, Social history, and Family History were  reviewed and updated.  Review of Systems: Review of Systems  Constitutional: Negative.   HENT: Negative.    Eyes: Negative.   Respiratory: Negative.    Cardiovascular: Negative.   Gastrointestinal: Negative.   Genitourinary: Negative.   Musculoskeletal: Negative.   Skin: Negative.   Neurological: Negative.   Endo/Heme/Allergies: Negative.   Psychiatric/Behavioral: Negative.       Physical Exam:  height is 5' 10.5" (1.791 m) and weight is 184 lb (83.5 kg). His oral temperature is 98 F (36.7 C). His blood pressure is 139/58 (abnormal) and his pulse is 60. His respiration is 18 and oxygen saturation is 100%.   Wt Readings from Last 3 Encounters:  06/15/23 184 lb (83.5 kg)  05/31/23 178 lb (80.7 kg)  05/18/23 184 lb (83.5 kg)    Physical Exam Vitals reviewed.  HENT:     Head: Normocephalic and atraumatic.  Eyes:     Pupils: Pupils are equal, round, and reactive to light.  Cardiovascular:     Rate and Rhythm: Normal rate and regular rhythm.     Heart sounds: Normal heart sounds.  Pulmonary:     Effort: Pulmonary effort is normal.     Breath sounds: Normal breath sounds.  Abdominal:     General: Bowel sounds are normal.     Palpations: Abdomen is soft.  Musculoskeletal:        General: No tenderness or deformity. Normal range of motion.     Cervical back: Normal range of motion.  Lymphadenopathy:     Cervical: No cervical adenopathy.  Skin:    General: Skin is warm and dry.     Findings: No erythema or rash.  Neurological:     Mental Status: He is alert and oriented to person, place, and time.  Psychiatric:        Behavior: Behavior normal.        Thought Content: Thought content normal.        Judgment: Judgment normal.      Lab Results  Component Value Date   WBC 5.2 06/15/2023   HGB 11.6 (L) 06/15/2023   HCT 36.5 (L) 06/15/2023   MCV 94.6 06/15/2023   PLT 146 (L) 06/15/2023   Lab Results  Component Value Date   FERRITIN 103 05/18/2023   IRON 68  05/18/2023   TIBC 287 05/18/2023   UIBC 219 05/18/2023   IRONPCTSAT 24 05/18/2023   Lab Results  Component Value Date   RETICCTPCT 1.2 06/14/2022   RBC 3.86 (L) 06/15/2023   No results found for: "KPAFRELGTCHN", "LAMBDASER", "KAPLAMBRATIO" No results found for: "IGGSERUM", "IGA", "IGMSERUM" No results found for: "TOTALPROTELP", "ALBUMINELP", "A1GS", "A2GS", "BETS", "BETA2SER", "GAMS", "MSPIKE", "SPEI"   Chemistry  Component Value Date/Time   NA 140 06/15/2023 0842   NA 141 06/22/2022 0942   NA 144 10/04/2017 0904   NA 141 07/23/2016 1255   K 4.3 06/15/2023 0842   K 3.8 10/04/2017 0904   K 4.1 07/23/2016 1255   CL 103 06/15/2023 0842   CL 103 10/04/2017 0904   CO2 32 06/15/2023 0842   CO2 28 10/04/2017 0904   CO2 27 07/23/2016 1255   BUN 20 06/15/2023 0842   BUN 20 06/22/2022 0942   BUN 14 10/04/2017 0904   BUN 13.8 07/23/2016 1255   CREATININE 1.13 06/15/2023 0842   CREATININE 0.8 10/04/2017 0904   CREATININE 1.0 07/23/2016 1255      Component Value Date/Time   CALCIUM 9.2 06/15/2023 0842   CALCIUM 9.4 10/04/2017 0904   CALCIUM 9.2 07/23/2016 1255   ALKPHOS 54 06/15/2023 0842   ALKPHOS 64 10/04/2017 0904   ALKPHOS 77 07/23/2016 1255   AST 23 06/15/2023 0842   AST 23 07/23/2016 1255   ALT 14 06/15/2023 0842   ALT 22 10/04/2017 0904   ALT 13 07/23/2016 1255   BILITOT 0.9 06/15/2023 0842   BILITOT 1.13 07/23/2016 1255       Impression and Plan: Brian Mays is a very pleasant 77 yo caucasian gentleman with metastatic low-grade neuroendocrine carcinoma with hepatic metastasis.   So far, everything has been holding pretty steady.  He is doing well with the Somatuline.  We typically follow the Chromogranin A levels as to how he is doing.  At some point, we probably will have to set him up with some scans.  I think we can probably do this in the Fall.  We will plan to get him back in 1 month.    Josph Macho, MD 8/14/20249:50 AM

## 2023-06-15 NOTE — Patient Instructions (Signed)
Lanreotide Injection What is this medication? LANREOTIDE (lan REE oh tide) treats high levels of growth hormone (acromegaly). It is used when other therapies have not worked well enough or cannot be tolerated. It works by reducing the amount of growth hormone your body makes. This reduces symptoms and the risk of health problems caused by too much growth hormone, such as diabetes and heart disease. It may also be used to treat neuroendocrine tumors, a cancer of the cells that release hormones and other substances in your body. It works by slowing down the release of these substances from the cells. This slows tumor growth. It also decreases the symptoms of carcinoid syndrome, such as flushing or diarrhea. This medicine may be used for other purposes; ask your health care provider or pharmacist if you have questions. COMMON BRAND NAME(S): Somatuline [place] What should I tell my care team before I take this medication? They need to know if you have any of these conditions: Diabetes Gallbladder disease Heart disease Kidney disease Liver disease Thyroid disease An unusual or allergic reaction to lanreotide, other medications, foods, dyes, or preservatives Pregnant or trying to get pregnant Breast-feeding How should I use this medication? This medication is injected under the skin. It is given by your care team in a hospital or clinic setting. Talk to your care team about the use of this medication in children. Special care may be needed. Overdosage: If you think you have taken too much of this medicine contact a poison control center or emergency room at once. NOTE: This medicine is only for you. Do not share this medicine with others. What if I miss a dose? Keep appointments for follow-up doses. It is important not to miss your dose. Call your care team if you are unable to keep an appointment. What may interact with this medication? Bromocriptine Cyclosporine Certain medications for blood  pressure, heart disease, irregular heartbeat Certain medications for diabetes Quinidine Terfenadine This list may not describe all possible interactions. Give your health care provider a list of all the medicines, herbs, non-prescription drugs, or dietary supplements you use. Also tell them if you smoke, drink alcohol, or use illegal drugs. Some items may interact with your medicine. What should I watch for while using this medication? Visit your care team for regular checks on your progress. Tell your care team if your symptoms do not start to get better or if they get worse. Your condition will be monitored carefully while you are receiving this medication. You may need blood work while you are taking this medication. This medication may increase blood sugar. The risk may be higher in patients who already have diabetes. Ask your care team what you can do to lower your risk of diabetes while taking this medication. Talk to your care team if you wish to become pregnant or think you may be pregnant. This medication can cause serious birth defects. Do not breast-feed while taking this medication and for 6 months after stopping therapy. This medication may cause infertility. Talk to your care team if you are concerned about your fertility. What side effects may I notice from receiving this medication? Side effects that you should report to your care team as soon as possible: Allergic reactions--skin rash, itching, hives, swelling of the face, lips, tongue, or throat Gallbladder problems--severe stomach pain, nausea, vomiting, fever High blood sugar (hyperglycemia)--increased thirst or amount of urine, unusual weakness or fatigue, blurry vision Increase in blood pressure Low blood sugar (hypoglycemia)--tremors or shaking, anxiety, sweating, cold   or clammy skin, confusion, dizziness, rapid heartbeat Low thyroid levels (hypothyroidism)--unusual weakness or fatigue, increased sensitivity to cold,  constipation, hair loss, dry skin, weight gain, feelings of depression Slow heartbeat--dizziness, feeling faint or lightheaded, confusion, trouble breathing, unusual weakness or fatigue Side effects that usually do not require medical attention (report to your care team if they continue or are bothersome): Diarrhea Dizziness Headache Muscle spasms Nausea Pain, redness, irritation, or bruising at the injection site Stomach pain This list may not describe all possible side effects. Call your doctor for medical advice about side effects. You may report side effects to FDA at 1-800-FDA-1088. Where should I keep my medication? This medication is given in a hospital or clinic. It will not be stored at home. NOTE: This sheet is a summary. It may not cover all possible information. If you have questions about this medicine, talk to your doctor, pharmacist, or health care provider.  2024 Elsevier/Gold Standard (2022-03-10 00:00:00)  

## 2023-06-16 ENCOUNTER — Other Ambulatory Visit: Payer: Self-pay | Admitting: Medical Oncology

## 2023-06-16 LAB — CHROMOGRANIN A: Chromogranin A (ng/mL): 179.8 ng/mL — ABNORMAL HIGH (ref 0.0–101.8)

## 2023-06-23 ENCOUNTER — Encounter: Payer: Self-pay | Admitting: Cardiovascular Disease

## 2023-06-23 NOTE — Progress Notes (Signed)
Patient ID: Brian Mays; [id number]; [birth year]   Admit date: (Not on file) Date of Consult: 06/24/2023  Primary Care Provider: Pearline Cables, MD Primary Cardiologist:  Heitor Steinhoff  Primary Electrophysiologist:     Problem list 1.  Coronary artery disease: Status post rotational atherectomy and stenting of the RCA 2.  Hypertension 3.  Hyperlipidemia 4.  Hypothyroidism  Patient Profile:   Brian Mays is a 77 y.o. male with a hx of HTN, , coronary artery disease with a recent episode of weakness and near syncope.  who is being seen today for the evaluation of HTN  at the request of Dr. Myna Hidalgo. He sees Dr. Myna Hidalgo for Carcinoid       [name] is seen with wife wife , [name] today   Hx of coronary stenting in March 2017 .   Was started on Metoprolol, he has had intermittent weakness and dizziness/lightheadedness since that time.  He eats and drinks regularly.   Generally gets some regular exercise.  Has had some back issues recently.  He still goes to the line does some exercises that do not cause further back pain.  Dr. Myna Hidalgo asked him to reduce his BP meds.   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  [facility] . Does have some lightheadedness after working out .  No CP or dyspnea while working out.   No synmptoms similar to his MI.    He stopped the Losartan last week He decreased the metoprolol to 12. 5 mg BID last week.   Still eats salty foods. Wife cooks from [employer]/gallery cur[employer] .  Eats some pretzels.   Has flushing related to Carcinoid.    Typically occurs if he drinks some beer .   Feb.  12, 2019: Doing well Gaining some weight .   Gave him the OK to walk  No CP or dyspnea   Aug. 27 ,2019:  Doing well.   Feeling better since he decreased his metoprolol in half.   September 15, 2018: Seen back today for follow-up of his coronary artery disease,  hypertension, hyperlipidemia. He increase his losartan 3 weeks ago. No CP  BP readings at home have been elevated.  Is not exercising   August 06, 2019:  Quintavious is seen back today for follow-up of his coronary artery disease, hypertension, hyperlipidemia. BP has been well controlled at home .  No CP , is active , not doing exercise per se Tries to avoid salty foods.   April 22, 2020:  Brian Mays is seen today for follow up for his CAD, HTN, and hyperlipidemia. Several weeks ago he developed some light headedness.  Was helping a customer unload paint.  Was bending over - up and down repeatedly  Symptoms ~ c/w orthostasis   Went to the ER  Denies any chest pain .   Associated with some fatigue.  Had not skipped any meals,  May have been a bit volume depleted. ( had walked 5 miles the day before )  Labs looked ok .   Troponin were negative x 2.  ECG was normal   Dec. 3, 2021: Brian Mays is seen back today for follow up of his CAD, HTN, HLD Had some orthostasis prior to his previous visit  We reduced  his HCTZ at his last visit  Still eats salty foods regularly  Is not  exercising regularly .   Dec. 2, 2022 Brian Mays is seen back for follow up of his CAD, HTN, HLD No cp Is not as active as he used to be Is having to take care of his wife who had brain cancer surgery  She will need radiation  Has had his 2 covid vaccines and 2 boosters.  Has not had had covid  Wants to get back out on his mountain bike - ive encouraged him to ride / exercise regularly   Feb. 27, 2023 Brian Mays is here to follow up with CAD, HTN, HLD  He here with some anginal-like symptoms in December.  Heart catheterization revealed a mid RCA stenosis of 99%.  This lesion was stented.  He has mild LAD disease. His belching has resolved.   LDL was 44 in Nov. 2023  Had been started on Imdur prior to his procedure.  OK to stop imdur  February 26, 2022:  Had stenting several months ago  He had acute dizziness and  some belching (which is his anginal equivalent).  While driving.  He was admitted to a [facility] hospital.  Heart catheterization revealed a tight in-stent restenosis.  He had repeat PCI of an existing stent.   Conclusion: 1. Successful intervention with intravascular ultrasound to the proximal and mid right coronary artery using balloon angioplasty with 3.0 x 12, 3.5 x 20 and 4.0 x 12 mm [employer] Quantum balloons. Preintervention stenosis 90%, postintervention stenosis 20%.  Recommendations: Dual anti-platelet therapy. High intensity statin therapy. Continue risk factor modifications. Add Ranexa, if he continues to have symptoms can consider shockwave therapy to the mid RCA   He still had some episodes of lightheadedness.  He reduced his metoprolol to one half and that seems to have helped with his lightheadedness.  He is having to do all the driving and his family since his wife has a brain tumor.   Aug. 22, 2023  Edis is seen for follow up of his CAD , He is not having any angina symptoms  Was having lots of belching  Is exercising / walking quite a bit , is not having any angina  Has lots 28 lbs since last year  Labs drawn today   Aug. 23, 2024 Brian Mays is seen for follow up of his CAD, HLD  He has been found to have a low grade neuroendocrine tumor with hepatic mets  - sees Dr. Myna Hidalgo   BP has been on the high side     Past Medical History:  Diagnosis Date   Anemia    in past   Cataract    Colon polyps    Diverticulosis    Gallstones    GERD (gastroesophageal reflux disease)    Heart attack (HCC)    mild, Spring 2017   Heart murmur    Hemorrhoid    Hiatal hernia    HTN (hypertension)    Hyperlipidemia    Hypothyroidism    Iron deficiency anemia due to chronic blood loss 03/29/2019   Iron malabsorption 03/29/2019   Metastatic carcinoma (HCC) 2010   Dr Twanna Hy   Neuroendocrine cancer [facility]) 08/2021   Pancreatitis 1998   chronic    Past Surgical History:   Procedure Laterality Date   APPENDECTOMY  1962   CARDIAC CATHETERIZATION N/A 02/10/2016   Procedure: Left Heart Cath and Coronary Angiography;  Surgeon: Yates Decamp, MD;  Location: [facility] INVASIVE CV LAB;  Service: Cardiovascular;  Laterality: N/A;   CARDIAC CATHETERIZATION  02/10/2016   Procedure: Coronary/Graft Atherectomy;  Surgeon: Yates Decamp, MD;  Location: [facility] INVASIVE CV LAB;  Service: Cardiovascular;;   CARDIAC CATHETERIZATION  02/10/2016   Procedure: Coronary Stent Intervention;  Surgeon: Yates Decamp, MD;  Location: [name] INVASIVE CV LAB;  Service: Cardiovascular;;   CATARACT EXTRACTION W/ INTRAOCULAR LENS  IMPLANT, BILATERAL Bilateral    CORONARY STENT INTERVENTION N/A 12/16/2021   Procedure: CORONARY STENT INTERVENTION;  Surgeon: Orbie Pyo, MD;  Location: [facility] INVASIVE CV LAB;  Service: Cardiovascular;  Laterality: N/A;   IR GENERIC HISTORICAL  12/30/2015   IR RADIOLOGIST EVAL & MGMT 12/30/2015 Irish Lack, MD [facility] INTERV RAD   IR GENERIC HISTORICAL  11/09/2016   IR RADIOLOGIST EVAL & MGMT 11/09/2016 Irish Lack, MD [facility] INTERV RAD   IR RADIOLOGIST EVAL & MGMT  03/01/2017   IR RADIOLOGIST EVAL & MGMT  06/29/2017   LAPAROSCOPIC CHOLECYSTECTOMY  1999   LEFT HEART CATH AND CORONARY ANGIOGRAPHY N/A 12/16/2021   Procedure: LEFT HEART CATH AND CORONARY ANGIOGRAPHY;  Surgeon: Orbie Pyo, MD;  Location: [facility] INVASIVE CV LAB;  Service: Cardiovascular;  Laterality: N/A;   LIVER BIOPSY  2010   RADIOACTIVE SEED IMPLANT  X 3   "to my liver"   TUMOR EXCISION  01/2009   Carcinoil Resection    TUMOR REMOVAL     from small intestine     Home Medications:  Prior to Admission medications   Medication Sig Start Date End Date Taking? Authorizing Provider  amLODipine (NORVASC) 5 MG tablet Take 5 mg by mouth daily. 03/16/17  Yes [provider]  aspirin EC 81 MG tablet Take 81 mg by mouth every morning.   Yes [provider]  atorvastatin (LIPITOR) 20 MG tablet Take 20 mg daily by  mouth.   Yes [provider]  Cholecalciferol 1000 UNITS tablet Take 1,000 Units by mouth daily.     Yes [provider]  fluticasone (FLONASE) 50 MCG/ACT nasal spray Place 2 sprays into both nostrils daily as needed (sinuses).  01/06/15  Yes [provider]  hydrocortisone (ANUSOL-HC) 25 MG suppository INSERT 1 SUPPOSITORY RECTALLY TWICE A DAY AS NEEDED FOR HEMORRHOIDS 02/07/15  Yes [place], Brand Males, NP  Lanreotide Acetate (SOMATULINE DEPOT [place]) Inject 120 mcg into the skin every 28 (twenty-eight) days. Receives at Dr Gustavo Lah office   Yes [provider]  lansoprazole (PREVACID) 30 MG capsule Take 1 capsule (30 mg total) by mouth daily at 12 noon. 11/19/16  Yes Copland, Gwenlyn Found, MD  levothyroxine (SYNTHROID, LEVOTHROID) 50 MCG tablet Take 1 tablet (50 mcg total) by mouth daily. 08/09/17  Yes Copland, Gwenlyn Found, MD  naproxen (NAPROSYN) 500 MG tablet Take 1 tablet (500 mg total) by mouth 2 (two) times daily with a meal. 08/16/17  Yes Tarry Kos, MD  nitroGLYCERIN (NITROSTAT) 0.4 MG SL tablet Place 1 tablet (0.4 mg total) under the tongue every 5 (five) minutes x 3 doses as needed for chest pain. 02/11/16  Yes Yates Decamp, MD  Omega-3 Fatty Acids (FISH OIL) 1000 MG CAPS Take 1 capsule by mouth daily. Reported on 12/30/2015   Yes [provider]  predniSONE (DELTASONE) 50 MG tablet Take 50mg  at 13 hours, 7 hours, and 1 hour before scan 02/18/17  Yes [place], Brand Males, NP  sodium chloride (OCEAN) 0.65 % SOLN nasal spray Place 1 spray into both nostrils as needed for congestion.    Yes [provider]  ticagrelor (BRILINTA) 90 MG TABS tablet Take 1 tablet (90 mg total) by mouth 2 (two)  times daily. 02/11/16  Yes Yates Decamp, MD  tiZANidine (ZANAFLEX) 4 MG tablet Take 1 tablet (4 mg total) by mouth every 6 (six) hours as needed for muscle spasms. 08/16/17  Yes Tarry Kos, MD  vitamin B-12 (CYANOCOBALAMIN) 1000 MCG tablet Take 1,000 mcg by mouth daily.    Yes [provider]  diphenhydrAMINE (BENADRYL) 50 MG tablet Take 1 tablet (50 mg total) by mouth once. Take 1 hour prior to scan. 02/18/17 02/18/17  [place], Brand Males, NP  metoprolol tartrate (LOPRESSOR) 25 MG tablet Take 0.5 tablets (12.5 mg total) 2 (two) times daily by mouth. 09/14/17   Briani Maul, Deloris Ping, MD     Allergies:    Allergies  Allergen Reactions   Iohexol Hives     Code: HIVES, Desc: PER [name] @ PRIMARY CARE, PT IS ALLERGIC TO CONTRAST DYE 10/02/08/RM  05/01/10...needs full premeds per our protocol w/ [facility]., Onset Date: [id number]    Iodinated Contrast Media Hives    Social History:   Social History   Socioeconomic History   Marital status: Widowed    Spouse name: Not on file   Number of children: 1   Years of education: Not on file   Highest education level: Associate degree: academic program  Occupational History   Occupation: Investment banker, corporate: [employer]  Tobacco Use   Smoking status: Former    Current packs/day: 0.00    Average packs/day: 1.5 packs/day for 38.2 years (57.4 ttl pk-yrs)    Types: Cigarettes    Start date: 09/25/1959    Quit date: 12/21/1997    Years since quitting: 25.5   Smokeless tobacco: Never  Vaping Use   Vaping status: Never Used  Substance and Sexual Activity   Alcohol use: Yes    Alcohol/week: 3.0 - 4.0 standard drinks of alcohol    Types: 3 - 4 Cans of beer per week    Comment: 1-2 beers twice a week   Drug use: No   Sexual activity: Yes  Other Topics Concern   Not on file  Social History Narrative   Regular Exercise -  3 days a week x 90 min    Social Determinants of Health   Financial Resource Strain: Low Risk  (05/28/2023)   Overall Financial Resource Strain (CARDIA)    Difficulty of Paying Living Expenses: Not hard at all  Food Insecurity: No Food Insecurity (05/28/2023)   Hunger Vital Sign    Worried About Running Out of Food in the Last Year: Never true    Ran Out of Food in the Last Year:  Never true  Transportation Needs: No Transportation Needs (05/28/2023)   PRAPARE - Administrator, Civil Service (Medical): No    Lack of Transportation (Non-Medical): No  Physical Activity: Sufficiently Active (05/28/2023)   Exercise Vital Sign    Days of Exercise per Week: 3 days    Minutes of Exercise per Session: 130 min  Stress: No Stress Concern Present (05/28/2023)   [employer] of Occupational Health - Occupational Stress Questionnaire    Feeling of Stress : Not at all  Social Connections: Socially Isolated (05/28/2023)   Social Connection and Isolation Panel [NHANES]    Frequency of Communication with Friends and Family: More than three times a week    Frequency of Social Gatherings with Friends and Family: Twice a week    Attends Religious Services: Never    Database administrator or Organizations: No  Attends Banker Meetings: Never    Marital Status: Widowed  Intimate Partner Violence: Not At Risk (05/31/2023)   Humiliation, Afraid, Rape, and Kick questionnaire    Fear of Current or Ex-Partner: No    Emotionally Abused: No    Physically Abused: No    Sexually Abused: No    Family History:    Family History  Problem Relation Age of Onset   Kidney disease Mother    Hyperlipidemia Mother    Hypertension Mother    COPD Father    Ulcerative colitis Daughter    Colon cancer Neg Hx    Esophageal cancer Neg Hx    Rectal cancer Neg Hx    Stomach cancer Neg Hx      ROS:  Noted in current history, all other systems are negative.  Physical Exam: Blood pressure (!) 150/80, pulse (!) 58, height 5' 10.5" (1.791 m), weight 186 lb 12.8 oz (84.7 kg), SpO2 98%.  HYPERTENSION CONTROL Vitals:   06/24/23 0859 06/24/23 0932  BP: (!) 140/66 (!) 150/80    The patient's blood pressure is elevated above target today.  In order to address the patient's elevated BP: A current anti-hypertensive medication was adjusted today.      GEN:  Well  nourished, well developed in no acute distress HEENT: Normal NECK: No JVD; No carotid bruits LYMPHATICS: No lymphadenopathy CARDIAC: RRR 2/6 systolic murmur  RESPIRATORY:  Clear to auscultation without rales, wheezing or rhonchi  ABDOMEN: Soft, non-tender, non-distended MUSCULOSKELETAL:  No edema; No deformity  SKIN: Warm and dry NEUROLOGIC:  Alert and oriented x 3    EKG:  EKG Interpretation Date/Time:  Friday June 24 2023 09:04:25 EDT Ventricular Rate:  58 PR Interval:  232 QRS Duration:  78 QT Interval:  414 QTC Calculation: 406 R Axis:   9  Text Interpretation: Sinus bradycardia with 1st degree A-V block When compared with ECG of 17-Dec-2021 06:50, Criteria for Inferior infarct are no longer Present T wave inversion no longer evident in Inferior leads Nonspecific T wave abnormality no longer evident in Lateral leads Confirmed by Kristeen Miss ([id number]) on 06/24/2023 9:34:57 AM       Relevant CV Studies:    Laboratory Data:  ChemistryNo results for input(s): "NA", "K", "CL", "CO2", "GLUCOSE", "BUN", "CREATININE", "CALCIUM", "GFRNONAA", "GFRAA", "ANIONGAP" in the last 168 hours.  No results for input(s): "PROT", "ALBUMIN", "AST", "ALT", "ALKPHOS", "BILITOT" in the last 168 hours. HematologyNo results for input(s): "WBC", "RBC", "HGB", "HCT", "MCV", "MCH", "MCHC", "RDW", "PLT" in the last 168 hours. Cardiac EnzymesNo results for input(s): "TROPONINI" in the last 168 hours. No results for input(s): "TROPIPOC" in the last 168 hours.  BNPNo results for input(s): "BNP", "PROBNP" in the last 168 hours.  DDimer No results for input(s): "DDIMER" in the last 168 hours.  Radiology/Studies:  No results found.  Assessment and Plan:     1.  Coronary artery disease:   had repeat PCI at [facility] (opened up a previously placed mid RCA stent )  IVUS at the most recent cath revealed calcium between the layers of the mid RCA stent.   [facility] suggested that Shockwave therapy may be  beneficial if he needs further PCI to this region   No angina      2.  Near syncope:    3.  Hypertension:     increase losartan to 100 mg a day  BMP in 3 weeks .    4.  Hyperlipidemia:    his LDL is  29 , on atorva 80       For questions or updates, please contact [facility] Please consult [email or web address] for contact info under Cardiology/STEMI.   Signed, Kristeen Miss, MD  06/24/2023 5:34 PM

## 2023-06-24 ENCOUNTER — Ambulatory Visit: Payer: Medicare HMO | Admitting: Cardiovascular Disease

## 2023-06-24 ENCOUNTER — Encounter: Payer: Self-pay | Admitting: Cardiovascular Disease

## 2023-06-24 VITALS — BP 150/80 | HR 58 | Ht 70.5 in | Wt 186.8 lb

## 2023-06-24 DIAGNOSIS — I10 Essential (primary) hypertension: Secondary | ICD-10-CM | POA: Diagnosis not present

## 2023-06-24 DIAGNOSIS — I351 Nonrheumatic aortic (valve) insufficiency: Secondary | ICD-10-CM

## 2023-06-24 DIAGNOSIS — E782 Mixed hyperlipidemia: Secondary | ICD-10-CM

## 2023-06-24 DIAGNOSIS — I2510 Atherosclerotic heart disease of native coronary artery without angina pectoris: Secondary | ICD-10-CM

## 2023-06-24 MED ORDER — LOSARTAN POTASSIUM 100 MG PO TABS: 100.0000 mg | ORAL_TABLET | Freq: Every day | ORAL | 3 refills | Status: AC

## 2023-06-24 NOTE — Patient Instructions (Signed)
Medication Instructions:  Your physician has recommended you make the following change in your medication:   1) INCREASE losartan to 100mg  daily  *If you need a refill on your cardiac medications before your next appointment, please call your pharmacy*  Lab Work: In 3 weeks: BMET If you have labs (blood work) drawn today and your tests are completely normal, you will receive your results only by: MyChart Message (if you have MyChart) OR A paper copy in the mail If you have any lab test that is abnormal or we need to change your treatment, we will call you to review the results.  Testing/Procedures: None ordered today.  Follow-Up: At [facility], you and your health needs are our priority.  As part of our continuing mission to provide you with exceptional heart care, we have created designated Provider Care Teams.  These Care Teams include your primary Cardiologist (physician) and Advanced Practice Providers (APPs -  Physician Assistants and Nurse Practitioners) who all work together to provide you with the care you need, when you need it.  Your next appointment:   6 month(s)  The format for your next appointment:   In Person  Provider:   Jari Favre, PA-C, Eligha Bridegroom, NP, or Tereso Newcomer, PA-C

## 2023-06-27 ENCOUNTER — Inpatient Hospital Stay: Payer: Medicare HMO

## 2023-06-27 VITALS — BP 150/54 | HR 64 | Temp 98.4°F | Resp 18 | Wt 185.0 lb

## 2023-06-27 DIAGNOSIS — C7B8 Other secondary neuroendocrine tumors: Secondary | ICD-10-CM

## 2023-06-27 DIAGNOSIS — D509 Iron deficiency anemia, unspecified: Secondary | ICD-10-CM | POA: Diagnosis not present

## 2023-06-27 DIAGNOSIS — C7A8 Other malignant neuroendocrine tumors: Secondary | ICD-10-CM | POA: Diagnosis not present

## 2023-06-27 DIAGNOSIS — D500 Iron deficiency anemia secondary to blood loss (chronic): Secondary | ICD-10-CM

## 2023-06-27 MED ORDER — SODIUM CHLORIDE 0.9 % IV SOLN: Freq: Once | INTRAVENOUS | Status: AC

## 2023-06-27 MED ORDER — SODIUM CHLORIDE 0.9 % IV SOLN
300.0000 mg | Freq: Once | INTRAVENOUS | Status: AC
  Administered 2023-06-27: 300 mg via INTRAVENOUS
  Filled 2023-06-27: qty 300

## 2023-06-27 NOTE — Patient Instructions (Signed)
 Iron Sucrose Injection What is this medication? IRON SUCROSE (EYE ern SOO krose) treats low levels of iron (iron deficiency anemia) in people with kidney disease. Iron is a mineral that plays an important role in making red blood cells, which carry oxygen from your lungs to the rest of your body. This medicine may be used for other purposes; ask your health care provider or pharmacist if you have questions. COMMON BRAND NAME(S): Venofer What should I tell my care team before I take this medication? They need to know if you have any of these conditions: Anemia not caused by low iron levels Heart disease High levels of iron in the blood Kidney disease Liver disease An unusual or allergic reaction to iron, other medications, foods, dyes, or preservatives Pregnant or trying to get pregnant Breastfeeding How should I use this medication? This medication is for infusion into a vein. It is given in a hospital or clinic setting. Talk to your care team about the use of this medication in children. While this medication may be prescribed for children as young as 2 years for selected conditions, precautions do apply. Overdosage: If you think you have taken too much of this medicine contact a poison control center or emergency room at once. NOTE: This medicine is only for you. Do not share this medicine with others. What if I miss a dose? Keep appointments for follow-up doses. It is important not to miss your dose. Call your care team if you are unable to keep an appointment. What may interact with this medication? Do not take this medication with any of the following: Deferoxamine Dimercaprol Other iron products This medication may also interact with the following: Chloramphenicol Deferasirox This list may not describe all possible interactions. Give your health care provider a list of all the medicines, herbs, non-prescription drugs, or dietary supplements you use. Also tell them if you smoke,  drink alcohol, or use illegal drugs. Some items may interact with your medicine. What should I watch for while using this medication? Visit your care team regularly. Tell your care team if your symptoms do not start to get better or if they get worse. You may need blood work done while you are taking this medication. You may need to follow a special diet. Talk to your care team. Foods that contain iron include: whole grains/cereals, dried fruits, beans, or peas, leafy green vegetables, and organ meats (liver, kidney). What side effects may I notice from receiving this medication? Side effects that you should report to your care team as soon as possible: Allergic reactions--skin rash, itching, hives, swelling of the face, lips, tongue, or throat Low blood pressure--dizziness, feeling faint or lightheaded, blurry vision Shortness of breath Side effects that usually do not require medical attention (report to your care team if they continue or are bothersome): Flushing Headache Joint pain Muscle pain Nausea Pain, redness, or irritation at injection site This list may not describe all possible side effects. Call your doctor for medical advice about side effects. You may report side effects to FDA at 1-800-FDA-1088. Where should I keep my medication? This medication is given in a hospital or clinic. It will not be stored at home. NOTE: This sheet is a summary. It may not cover all possible information. If you have questions about this medicine, talk to your doctor, pharmacist, or health care provider.  2024 Elsevier/Gold Standard (2023-03-25 00:00:00)

## 2023-07-13 ENCOUNTER — Inpatient Hospital Stay (HOSPITAL_BASED_OUTPATIENT_CLINIC_OR_DEPARTMENT_OTHER): Payer: Medicare HMO | Admitting: Hematology & Oncology

## 2023-07-13 ENCOUNTER — Inpatient Hospital Stay: Payer: Medicare HMO

## 2023-07-13 ENCOUNTER — Inpatient Hospital Stay: Payer: Medicare HMO | Attending: Hematology & Oncology

## 2023-07-13 ENCOUNTER — Encounter: Payer: Self-pay | Admitting: Hematology & Oncology

## 2023-07-13 VITALS — BP 139/69 | HR 60 | Temp 97.9°F | Resp 20 | Ht 70.5 in | Wt 187.0 lb

## 2023-07-13 DIAGNOSIS — C7B8 Other secondary neuroendocrine tumors: Secondary | ICD-10-CM

## 2023-07-13 DIAGNOSIS — C7A8 Other malignant neuroendocrine tumors: Secondary | ICD-10-CM | POA: Insufficient documentation

## 2023-07-13 DIAGNOSIS — D500 Iron deficiency anemia secondary to blood loss (chronic): Secondary | ICD-10-CM

## 2023-07-13 DIAGNOSIS — D509 Iron deficiency anemia, unspecified: Secondary | ICD-10-CM | POA: Diagnosis not present

## 2023-07-13 DIAGNOSIS — K769 Liver disease, unspecified: Secondary | ICD-10-CM

## 2023-07-13 LAB — CBC WITH DIFFERENTIAL (CANCER CENTER ONLY)
Abs Immature Granulocytes: 0.02 10*3/uL (ref 0.00–0.07)
Basophils Absolute: 0 10*3/uL (ref 0.0–0.1)
Basophils Relative: 1 %
Eosinophils Absolute: 0.4 10*3/uL (ref 0.0–0.5)
Eosinophils Relative: 6 %
HCT: 36.7 % — ABNORMAL LOW (ref 39.0–52.0)
Hemoglobin: 11.7 g/dL — ABNORMAL LOW (ref 13.0–17.0)
Immature Granulocytes: 0 %
Lymphocytes Relative: 14 %
Lymphs Abs: 0.8 10*3/uL (ref 0.7–4.0)
MCH: 30.3 pg (ref 26.0–34.0)
MCHC: 31.9 g/dL (ref 30.0–36.0)
MCV: 95.1 fL (ref 80.0–100.0)
Monocytes Absolute: 0.7 10*3/uL (ref 0.1–1.0)
Monocytes Relative: 12 %
Neutro Abs: 4.1 10*3/uL (ref 1.7–7.7)
Neutrophils Relative %: 67 %
Platelet Count: 133 10*3/uL — ABNORMAL LOW (ref 150–400)
RBC: 3.86 MIL/uL — ABNORMAL LOW (ref 4.22–5.81)
RDW: 14.6 % (ref 11.5–15.5)
WBC Count: 6 10*3/uL (ref 4.0–10.5)
nRBC: 0 % (ref 0.0–0.2)

## 2023-07-13 LAB — CMP (CANCER CENTER ONLY)
ALT: 15 U/L (ref 0–44)
AST: 23 U/L (ref 15–41)
Albumin: 4 g/dL (ref 3.5–5.0)
Alkaline Phosphatase: 54 U/L (ref 38–126)
Anion gap: 5 (ref 5–15)
BUN: 18 mg/dL (ref 8–23)
CO2: 32 mmol/L (ref 22–32)
Calcium: 9.5 mg/dL (ref 8.9–10.3)
Chloride: 102 mmol/L (ref 98–111)
Creatinine: 1.11 mg/dL (ref 0.61–1.24)
GFR, Estimated: >60 mL/min (ref >60)
Glucose, Bld: 96 mg/dL (ref 70–99)
Potassium: 4.2 mmol/L (ref 3.5–5.1)
Sodium: 139 mmol/L (ref 135–145)
Total Bilirubin: 0.9 mg/dL (ref 0.3–1.2)
Total Protein: 7.1 g/dL (ref 6.5–8.1)

## 2023-07-13 LAB — IRON AND IRON BINDING CAPACITY (CC-WL,HP ONLY)
Iron: 60 ug/dL (ref 45–182)
Saturation Ratios: 20 % (ref 17.9–39.5)
TIBC: 308 ug/dL (ref 250–450)
UIBC: 248 ug/dL (ref 117–376)

## 2023-07-13 LAB — FERRITIN: Ferritin: 168 ng/mL (ref 24–336)

## 2023-07-13 MED ORDER — LANREOTIDE ACETATE 120 MG/0.5ML ~~LOC~~ SOLN
120.0000 mg | Freq: Once | SUBCUTANEOUS | Status: AC
  Administered 2023-07-13: 120 mg via SUBCUTANEOUS
  Filled 2023-07-13: qty 120

## 2023-07-13 NOTE — Progress Notes (Signed)
Hematology and Oncology Follow Up Visit  ROODY THEBO [id number] 03-11-1946 77 y.o. 07/13/2023   Principle Diagnosis:  Metastatic low grade neuroendocrine tumor-hepatic metastases Iron deficiency anemia   Past Diagnosis:  S/p yttrium-90 intrahepatic therapy - November 2016 Lutathera (Lu 177) injection on 02/01/2018 - s/p cycle 4 SBRT to T6 lesion -- 11/06/2021   Current Therapy:        Somatuline 120 mg monthly IV Iron as indicated  --Venofer given on 06/27/2023  Interim History:  Mr. Ginyard is here today for follow-up and treatment.  He is managing pretty well.  I know that his wife passed on earlier this year.  He has been seems to be getting along pretty well.  Thankfully, he has a lot of family who can help him out.  He is exercising little bit more.  He got his mountain bike out.  He is riding this more often.  His last Chromogranin A level was a little bit high at 179.  We will have to watch this.  We really need to get another scan on him.  I would love to get a dotatate scan to see how exactly things look.  I know his insurance company has had a problem with this.  He has had no problems with diarrhea.  Is been no palpitations.  He is had no cardiac issues.  His last iron studies that were done back in August showed a ferritin of 114 with an iron saturation of 17%.  He is doing okay.  He really has no specific complaints.  I do not think he has working all that much now.  [place]  His last Chromogranin A level was 136.  This is holding steady.  He has had no problems with diarrhea.  He has had no issues with his heart.  His blood pressure has been fluctuating a little bit.  His last iron studies back in July showed a ferritin of 103 with an iron saturation of 24%.  We did go ahead and give him some Venofer back about 2 weeks ago.  He has had no bleeding.  There is been no leg swelling.  Overall, I would have said  that his performance status is probably ECOG 1.   Medications:  Allergies as of 07/13/2023       Reactions   Iohexol Hives    Code: HIVES, Desc: PER [name] @ [facility]IMARY [facility], PT IS ALLERGIC TO CONTRAST DYE 10/02/08/RM  05/01/10...needs full premeds per our protocol w/ [facility]., Onset Date: [id number]   Iodinated Contrast Media Hives        Medication List        Accurate as of July 13, 2023 10:03 AM. If you have any questions, ask your nurse or doctor.          aspirin EC 81 MG tablet Take 81 mg by mouth every morning.   atorvastatin 80 MG tablet Commonly known as: LIPITOR TAKE 1 TABLET BY MOUTH EVERY DAY   Cholecalciferol 25 MCG (1000 UT) tablet Take 1,000 Units by mouth 2 (two) times daily.   clopidogrel 75 MG tablet Commonly known as: PLAVIX TAKE 1 TABLET BY MOUTH EVERY DAY   cyanocobalamin 1000 MCG tablet Commonly known as: VITAMIN B12 Take 1,000 mcg by mouth every other day.   diphenhydramine-acetaminophen 25-500 MG Tabs tablet Commonly known as: TYLENOL PM Take 1 tablet by mouth at bedtime as needed (sleep/pain).  fluticasone 50 MCG/ACT nasal spray Commonly known as: FLONASE Place 2 sprays into both nostrils daily as needed (sinuses).   hydrocortisone 25 MG suppository Commonly known as: ANUSOL-HC INSERT 1 SUPPOSITORY RECTALLY TWICE A DAY AS NEEDED FOR HEMORRHOIDS   ipratropium 0.03 % nasal spray Commonly known as: ATROVENT Place 2 sprays into the nose 3 (three) times daily. Use as needed for nasal drip   lansoprazole 30 MG capsule Commonly known as: PREVACID TAKE 1 CAPSULE (30 MG TOTAL) BY MOUTH DAILY AT 12 NOON.   levothyroxine 50 MCG tablet Commonly known as: SYNTHROID Take 1 tablet (50 mcg total) by mouth daily before breakfast.   losartan 100 MG tablet Commonly known as: COZAAR Take 1 tablet (100 mg total) by mouth daily.   metoprolol tartrate 25 MG tablet Commonly known as: LOPRESSOR TAKE 0.5 TABLETS BY MOUTH 2 TIMES DAILY.    nitroGLYCERIN 0.4 MG SL tablet Commonly known as: NITROSTAT PLACE 1 TABLET UNDER THE TONGUE EVERY 5 (FIVE) MINUTES X 3 DOSES AS NEEDED FOR CHEST PAIN.   ranolazine 500 MG 12 hr tablet Commonly known as: RANEXA Take 1 tablet (500 mg total) by mouth 2 (two) times daily.   SOMATULINE DEPOT [place] Inject 120 mcg into the skin every 28 (twenty-eight) days. Receives at Dr Gustavo Lah office   SOOTHE XP OP Place 1 drop into both eyes daily as needed (dry eyes).        Allergies:  Allergies  Allergen Reactions   Iohexol Hives     Code: HIVES, Desc: PER [name] @ [facility]IMARY [facility], PT IS ALLERGIC TO CONTRAST DYE 10/02/08/RM  05/01/10...needs full premeds per our protocol w/ [facility]., Onset Date: [id number]    Iodinated Contrast Media Hives    Past Medical History, Surgical history, Social history, and Family History were reviewed and updated.  Review of Systems: Review of Systems  Constitutional: Negative.   HENT: Negative.    Eyes: Negative.   Respiratory: Negative.    Cardiovascular: Negative.   Gastrointestinal: Negative.   Genitourinary: Negative.   Musculoskeletal: Negative.   Skin: Negative.   Neurological: Negative.   Endo/Heme/Allergies: Negative.   Psychiatric/Behavioral: Negative.       Physical Exam:  height is 5' 10.5" (1.791 m) and weight is 187 lb (84.8 kg). His oral temperature is 97.9 F (36.6 C). His blood pressure is 139/69 and his pulse is 60. His respiration is 20 and oxygen saturation is 100%.   Wt Readings from Last 3 Encounters:  07/13/23 187 lb (84.8 kg)  06/27/23 185 lb (83.9 kg)  06/24/23 186 lb 12.8 oz (84.7 kg)    Physical Exam Vitals reviewed.  HENT:     Head: Normocephalic and atraumatic.  Eyes:     Pupils: Pupils are equal, round, and reactive to light.  Cardiovascular:     Rate and Rhythm: Normal rate and regular rhythm.     Heart sounds: Normal heart sounds.  Pulmonary:     Effort: Pulmonary effort is normal.     Breath sounds: Normal  breath sounds.  Abdominal:     General: Bowel sounds are normal.     Palpations: Abdomen is soft.  Musculoskeletal:        General: No tenderness or deformity. Normal range of motion.     Cervical back: Normal range of motion.  Lymphadenopathy:     Cervical: No cervical adenopathy.  Skin:    General: Skin is warm and dry.     Findings: No erythema or rash.  Neurological:  Mental Status: He is alert and oriented to person, place, and time.  Psychiatric:        Behavior: Behavior normal.        Thought Content: Thought content normal.        Judgment: Judgment normal.      Lab Results  Component Value Date   WBC 6.0 07/13/2023   HGB 11.7 (L) 07/13/2023   HCT 36.7 (L) 07/13/2023   MCV 95.1 07/13/2023   PLT 133 (L) 07/13/2023   Lab Results  Component Value Date   FERRITIN 114 06/15/2023   IRON 51 06/15/2023   TIBC 301 06/15/2023   UIBC 250 06/15/2023   IRONPCTSAT 17 (L) 06/15/2023   Lab Results  Component Value Date   RETICCTPCT 1.2 06/14/2022   RBC 3.86 (L) 07/13/2023   No results found for: "KPAFRELGTCHN", "LAMBDASER", "KAPLAMBRATIO" No results found for: "IGGSERUM", "IGA", "IGMSERUM" No results found for: "TOTALPROTELP", "ALBUMINELP", "A1GS", "A2GS", "BETS", "BETA2SER", "GAMS", "MSPIKE", "SPEI"   Chemistry      Component Value Date/Time   NA 140 06/15/2023 0842   NA 141 06/22/2022 0942   NA 144 10/04/2017 0904   NA 141 07/23/2016 1255   K 4.3 06/15/2023 0842   K 3.8 10/04/2017 0904   K 4.1 07/23/2016 1255   CL 103 06/15/2023 0842   CL 103 10/04/2017 0904   CO2 32 06/15/2023 0842   CO2 28 10/04/2017 0904   CO2 27 07/23/2016 1255   BUN 20 06/15/2023 0842   BUN 20 06/22/2022 0942   BUN 14 10/04/2017 0904   BUN 13.8 07/23/2016 1255   CREATININE 1.13 06/15/2023 0842   CREATININE 0.8 10/04/2017 0904   CREATININE 1.0 07/23/2016 1255      Component Value Date/Time   CALCIUM 9.2 06/15/2023 0842   CALCIUM 9.4 10/04/2017 0904   CALCIUM 9.2 07/23/2016  1255   ALKPHOS 54 06/15/2023 0842   ALKPHOS 64 10/04/2017 0904   ALKPHOS 77 07/23/2016 1255   AST 23 06/15/2023 0842   AST 23 07/23/2016 1255   ALT 14 06/15/2023 0842   ALT 22 10/04/2017 0904   ALT 13 07/23/2016 1255   BILITOT 0.9 06/15/2023 0842   BILITOT 1.13 07/23/2016 1255       Impression and Plan: Mr. Ngan is a very pleasant 77 yo caucasian gentleman with metastatic low-grade neuroendocrine carcinoma with hepatic metastasis.   So far, everything has been holding pretty steady.  He is doing well with the Somatuline.  We really need to get another set of scans on him.  I will try to get a dotatate scan on him.  We typically follow the Chromogranin A levels as to how he is doing.  We will plan for the scans to be done in about a month.  I will plan to have him come back to see [place] in about 4-5 weeks.     Josph Macho, MD 9/11/202410:03 AM

## 2023-07-13 NOTE — Patient Instructions (Signed)
Lanreotide Injection What is this medication? LANREOTIDE (lan REE oh tide) treats high levels of growth hormone (acromegaly). It is used when other therapies have not worked well enough or cannot be tolerated. It works by reducing the amount of growth hormone your body makes. This reduces symptoms and the risk of health problems caused by too much growth hormone, such as diabetes and heart disease. It may also be used to treat neuroendocrine tumors, a cancer of the cells that release hormones and other substances in your body. It works by slowing down the release of these substances from the cells. This slows tumor growth. It also decreases the symptoms of carcinoid syndrome, such as flushing or diarrhea. This medicine may be used for other purposes; ask your health care provider or pharmacist if you have questions. COMMON BRAND NAME(S): Somatuline [place] What should I tell my care team before I take this medication? They need to know if you have any of these conditions: Diabetes Gallbladder disease Heart disease Kidney disease Liver disease Thyroid disease An unusual or allergic reaction to lanreotide, other medications, foods, dyes, or preservatives Pregnant or trying to get pregnant Breast-feeding How should I use this medication? This medication is injected under the skin. It is given by your care team in a hospital or clinic setting. Talk to your care team about the use of this medication in children. Special care may be needed. Overdosage: If you think you have taken too much of this medicine contact a poison control center or emergency room at once. NOTE: This medicine is only for you. Do not share this medicine with others. What if I miss a dose? Keep appointments for follow-up doses. It is important not to miss your dose. Call your care team if you are unable to keep an appointment. What may interact with this medication? Bromocriptine Cyclosporine Certain medications for blood  pressure, heart disease, irregular heartbeat Certain medications for diabetes Quinidine Terfenadine This list may not describe all possible interactions. Give your health care provider a list of all the medicines, herbs, non-prescription drugs, or dietary supplements you use. Also tell them if you smoke, drink alcohol, or use illegal drugs. Some items may interact with your medicine. What should I watch for while using this medication? Visit your care team for regular checks on your progress. Tell your care team if your symptoms do not start to get better or if they get worse. Your condition will be monitored carefully while you are receiving this medication. You may need blood work while you are taking this medication. This medication may increase blood sugar. The risk may be higher in patients who already have diabetes. Ask your care team what you can do to lower your risk of diabetes while taking this medication. Talk to your care team if you wish to become pregnant or think you may be pregnant. This medication can cause serious birth defects. Do not breast-feed while taking this medication and for 6 months after stopping therapy. This medication may cause infertility. Talk to your care team if you are concerned about your fertility. What side effects may I notice from receiving this medication? Side effects that you should report to your care team as soon as possible: Allergic reactions--skin rash, itching, hives, swelling of the face, lips, tongue, or throat Gallbladder problems--severe stomach pain, nausea, vomiting, fever High blood sugar (hyperglycemia)--increased thirst or amount of urine, unusual weakness or fatigue, blurry vision Increase in blood pressure Low blood sugar (hypoglycemia)--tremors or shaking, anxiety, sweating, cold   or clammy skin, confusion, dizziness, rapid heartbeat Low thyroid levels (hypothyroidism)--unusual weakness or fatigue, increased sensitivity to cold,  constipation, hair loss, dry skin, weight gain, feelings of depression Slow heartbeat--dizziness, feeling faint or lightheaded, confusion, trouble breathing, unusual weakness or fatigue Side effects that usually do not require medical attention (report to your care team if they continue or are bothersome): Diarrhea Dizziness Headache Muscle spasms Nausea Pain, redness, irritation, or bruising at the injection site Stomach pain This list may not describe all possible side effects. Call your doctor for medical advice about side effects. You may report side effects to FDA at 1-800-FDA-1088. Where should I keep my medication? This medication is given in a hospital or clinic. It will not be stored at home. NOTE: This sheet is a summary. It may not cover all possible information. If you have questions about this medicine, talk to your doctor, pharmacist, or health care provider.  2024 Elsevier/Gold Standard (2022-03-10 00:00:00)  

## 2023-07-14 ENCOUNTER — Encounter: Payer: Self-pay | Admitting: *Deleted

## 2023-07-14 LAB — CHROMOGRANIN A: Chromogranin A (ng/mL): 168.2 ng/mL — ABNORMAL HIGH (ref 0.0–101.8)

## 2023-07-15 ENCOUNTER — Other Ambulatory Visit: Payer: Self-pay | Admitting: Family Medicine

## 2023-07-16 ENCOUNTER — Other Ambulatory Visit: Payer: Self-pay | Admitting: Cardiovascular Disease

## 2023-07-17 ENCOUNTER — Other Ambulatory Visit: Payer: Self-pay | Admitting: Cardiovascular Disease

## 2023-07-22 ENCOUNTER — Encounter (HOSPITAL_COMMUNITY)
Admission: RE | Admit: 2023-07-22 | Discharge: 2023-07-22 | Disposition: A | Discharge status: Home / Self Care | Payer: Medicare HMO | Source: Ambulatory Visit | Attending: Hematology & Oncology | Admitting: Hematology & Oncology

## 2023-07-22 DIAGNOSIS — C7B8 Other secondary neuroendocrine tumors: Secondary | ICD-10-CM | POA: Insufficient documentation

## 2023-07-22 DIAGNOSIS — K769 Liver disease, unspecified: Secondary | ICD-10-CM | POA: Diagnosis not present

## 2023-07-22 DIAGNOSIS — Z923 Personal history of irradiation: Secondary | ICD-10-CM | POA: Diagnosis not present

## 2023-07-22 DIAGNOSIS — C7A8 Other malignant neuroendocrine tumors: Secondary | ICD-10-CM | POA: Diagnosis not present

## 2023-07-22 MED ORDER — COPPER CU 64 DOTATATE 1 MCI/ML IV SOLN
4.0000 | Freq: Once | INTRAVENOUS | Status: AC
  Administered 2023-07-22: 3.8 via INTRAVENOUS

## 2023-07-30 ENCOUNTER — Other Ambulatory Visit: Payer: Self-pay | Admitting: Family Medicine

## 2023-08-01 ENCOUNTER — Other Ambulatory Visit: Payer: Medicare HMO

## 2023-08-01 ENCOUNTER — Other Ambulatory Visit: Payer: Self-pay

## 2023-08-01 ENCOUNTER — Inpatient Hospital Stay: Payer: Medicare HMO | Admitting: Hematology & Oncology

## 2023-08-01 ENCOUNTER — Encounter: Payer: Self-pay | Admitting: Hematology & Oncology

## 2023-08-01 VITALS — BP 161/59 | HR 58 | Temp 98.0°F | Resp 17 | Ht 70.5 in | Wt 184.0 lb

## 2023-08-01 DIAGNOSIS — C7B8 Other secondary neuroendocrine tumors: Secondary | ICD-10-CM | POA: Diagnosis not present

## 2023-08-01 DIAGNOSIS — C7A8 Other malignant neuroendocrine tumors: Secondary | ICD-10-CM | POA: Diagnosis not present

## 2023-08-01 DIAGNOSIS — K769 Liver disease, unspecified: Secondary | ICD-10-CM

## 2023-08-01 DIAGNOSIS — D509 Iron deficiency anemia, unspecified: Secondary | ICD-10-CM | POA: Diagnosis not present

## 2023-08-01 LAB — CBC WITH DIFFERENTIAL (CANCER CENTER ONLY)
Abs Immature Granulocytes: 0.07 10*3/uL (ref 0.00–0.07)
Basophils Absolute: 0 10*3/uL (ref 0.0–0.1)
Basophils Relative: 1 %
Eosinophils Absolute: 0.4 10*3/uL (ref 0.0–0.5)
Eosinophils Relative: 5 %
HCT: 36.5 % — ABNORMAL LOW (ref 39.0–52.0)
Hemoglobin: 11.5 g/dL — ABNORMAL LOW (ref 13.0–17.0)
Immature Granulocytes: 1 %
Lymphocytes Relative: 13 %
Lymphs Abs: 0.9 10*3/uL (ref 0.7–4.0)
MCH: 30 pg (ref 26.0–34.0)
MCHC: 31.5 g/dL (ref 30.0–36.0)
MCV: 95.3 fL (ref 80.0–100.0)
Monocytes Absolute: 0.5 10*3/uL (ref 0.1–1.0)
Monocytes Relative: 7 %
Neutro Abs: 5.1 10*3/uL (ref 1.7–7.7)
Neutrophils Relative %: 73 %
Platelet Count: 131 10*3/uL — ABNORMAL LOW (ref 150–400)
RBC: 3.83 MIL/uL — ABNORMAL LOW (ref 4.22–5.81)
RDW: 14.4 % (ref 11.5–15.5)
WBC Count: 6.9 10*3/uL (ref 4.0–10.5)
nRBC: 0 % (ref 0.0–0.2)

## 2023-08-01 LAB — RETICULOCYTES
Immature Retic Fract: 14.6 % (ref 2.3–15.9)
RBC.: 3.82 MIL/uL — ABNORMAL LOW (ref 4.22–5.81)
Retic Count, Absolute: 56.2 10*3/uL (ref 19.0–186.0)
Retic Ct Pct: 1.5 % (ref 0.4–3.1)

## 2023-08-01 LAB — FERRITIN: Ferritin: 139 ng/mL (ref 24–336)

## 2023-08-01 LAB — CMP (CANCER CENTER ONLY)
ALT: 13 U/L (ref 0–44)
AST: 21 U/L (ref 15–41)
Albumin: 3.7 g/dL (ref 3.5–5.0)
Alkaline Phosphatase: 45 U/L (ref 38–126)
Anion gap: 7 (ref 5–15)
BUN: 16 mg/dL (ref 8–23)
CO2: 30 mmol/L (ref 22–32)
Calcium: 8.8 mg/dL — ABNORMAL LOW (ref 8.9–10.3)
Chloride: 105 mmol/L (ref 98–111)
Creatinine: 1.12 mg/dL (ref 0.61–1.24)
GFR, Estimated: >60 mL/min (ref >60)
Glucose, Bld: 95 mg/dL (ref 70–99)
Potassium: 4.2 mmol/L (ref 3.5–5.1)
Sodium: 142 mmol/L (ref 135–145)
Total Bilirubin: 0.7 mg/dL (ref 0.3–1.2)
Total Protein: 7 g/dL (ref 6.5–8.1)

## 2023-08-01 NOTE — Progress Notes (Signed)
Hematology and Oncology Follow Up Visit  Brian Mays [id number] [birth year] 77 y.o. 08/01/2023   Principle Diagnosis:  Metastatic low grade neuroendocrine tumor-hepatic metastases Iron deficiency anemia   Past Diagnosis:  S/p yttrium-90 intrahepatic therapy - November 2016 Lutathera (Lu 177) injection on 02/01/2018 - s/p cycle 4 SBRT to T6 lesion -- 11/06/2021   Current Therapy:        Somatuline 120 mg monthly IV Iron as indicated  --Venofer given on 06/27/2023  Interim History:  Brian Mays is here today for follow-up.  Unfortunately, it looks like his disease becoming more progressive.  We did do a dotatate PET scan on him.  This was done on 07/22/2023.  This showed that he had new activity in the skeletal lesions in the ribs and pelvis.  He had stable disease in the liver and also notable metastasis.  His last Chromogranin A was 168 which is stable.  I will see if we can think about another Lutathera treatment on him.  The last one was over 5 years ago.  As such, I really think that it would be worthwhile trying.  I really would hate to go to chemotherapy on him.  I know that we could consider Afinitor.  I also know that Temodar/Xeloda also has a effective.  He feels okay.  He really does not have any pain.  He is playing golf.  He is walking.  He is trying to stay active.  He has had no diarrhea..  He has had no chest wall pain.  Has not noted any palpitations.  His last iron studies that were done about 3 weeks ago showed a ferritin of 168 with an iron saturation of 20%.  His appetite is doing okay.  He has had no nausea or vomiting.  He has not noted any leg swelling.  Overall, I would say his performance status is probably ECOG 1.     Medications:  Allergies as of 08/01/2023       Reactions   Iohexol Hives    Code: HIVES, Desc: PER [name] @ [facility]IMARY [facility]ARE, PT IS ALLERGIC TO CONTRAST DYE 10/02/08/RM  05/01/10...needs full premeds per our protocol w/ [facility]., Onset  Date: [id number]   Iodinated Contrast Media Hives        Medication List        Accurate as of August 01, 2023  5:12 PM. If you have any questions, ask your nurse or doctor.          aspirin EC 81 MG tablet Take 81 mg by mouth every morning.   atorvastatin 80 MG tablet Commonly known as: LIPITOR TAKE 1 TABLET BY MOUTH EVERY DAY   Cholecalciferol 25 MCG (1000 UT) tablet Take 1,000 Units by mouth 2 (two) times daily.   clopidogrel 75 MG tablet Commonly known as: PLAVIX TAKE 1 TABLET BY MOUTH EVERY DAY   cyanocobalamin 1000 MCG tablet Commonly known as: VITAMIN B12 Take 1,000 mcg by mouth every other day.   diphenhydramine-acetaminophen 25-500 MG Tabs tablet Commonly known as: TYLENOL PM Take 1 tablet by mouth at bedtime as needed (sleep/pain).   fluticasone 50 MCG/ACT nasal spray Commonly known as: FLONASE Place 2 sprays into both nostrils daily as needed (sinuses).   hydrocortisone 25 MG suppository Commonly known as: ANUSOL-HC INSERT 1 SUPPOSITORY RECTALLY TWICE A DAY AS NEEDED FOR HEMORRHOIDS   ipratropium 0.03 % nasal spray Commonly known as: ATROVENT Place 2 sprays into the nose 3 (three) times daily. Use as needed for  nasal drip   lansoprazole 30 MG capsule Commonly known as: PREVACID Take 1 capsule (30 mg total) by mouth daily at 12 noon.   levothyroxine 50 MCG tablet Commonly known as: SYNTHROID Take 1 tablet (50 mcg total) by mouth daily before breakfast.   losartan 100 MG tablet Commonly known as: COZAAR Take 1 tablet (100 mg total) by mouth daily.   metoprolol tartrate 25 MG tablet Commonly known as: LOPRESSOR TAKE 0.5 TABLETS BY MOUTH 2 TIMES DAILY.   nitroGLYCERIN 0.4 MG SL tablet Commonly known as: NITROSTAT PLACE 1 TABLET UNDER THE TONGUE EVERY 5 (FIVE) MINUTES X 3 DOSES AS NEEDED FOR CHEST PAIN.   ranolazine 500 MG 12 hr tablet Commonly known as: RANEXA Take 1 tablet by mouth twice daily   SOMATULINE DEPOT [place] Inject 120 mcg  into the skin every 28 (twenty-eight) days. Receives at Dr Gustavo Lah office   SOOTHE XP OP Place 1 drop into both eyes daily as needed (dry eyes).        Allergies:  Allergies  Allergen Reactions   Iohexol Hives     Code: HIVES, Desc: PER [name] @ [facility]IMARY [facility], PT IS ALLERGIC TO CONTRAST DYE 10/02/08/RM  05/01/10...needs full premeds per our protocol w/ [facility]., Onset Date: [id number]    Iodinated Contrast Media Hives    Past Medical History, Surgical history, Social history, and Family History were reviewed and updated.  Review of Systems: Review of Systems  Constitutional: Negative.   HENT: Negative.    Eyes: Negative.   Respiratory: Negative.    Cardiovascular: Negative.   Gastrointestinal: Negative.   Genitourinary: Negative.   Musculoskeletal: Negative.   Skin: Negative.   Neurological: Negative.   Endo/Heme/Allergies: Negative.   Psychiatric/Behavioral: Negative.       Physical Exam:  height is 5' 10.5" (1.791 m) and weight is 184 lb (83.5 kg). His oral temperature is 98 F (36.7 C). His blood pressure is 161/59 (abnormal) and his pulse is 58 (abnormal). His respiration is 17 and oxygen saturation is 100%.   Wt Readings from Last 3 Encounters:  08/01/23 184 lb (83.5 kg)  07/13/23 187 lb (84.8 kg)  06/27/23 185 lb (83.9 kg)    Physical Exam Vitals reviewed.  HENT:     Head: Normocephalic and atraumatic.  Eyes:     Pupils: Pupils are equal, round, and reactive to light.  Cardiovascular:     Rate and Rhythm: Normal rate and regular rhythm.     Heart sounds: Normal heart sounds.  Pulmonary:     Effort: Pulmonary effort is normal.     Breath sounds: Normal breath sounds.  Abdominal:     General: Bowel sounds are normal.     Palpations: Abdomen is soft.  Musculoskeletal:        General: No tenderness or deformity. Normal range of motion.     Cervical back: Normal range of motion.  Lymphadenopathy:     Cervical: No cervical adenopathy.  Skin:     General: Skin is warm and dry.     Findings: No erythema or rash.  Neurological:     Mental Status: He is alert and oriented to person, place, and time.  Psychiatric:        Behavior: Behavior normal.        Thought Content: Thought content normal.        Judgment: Judgment normal.      Lab Results  Component Value Date   WBC 6.9 08/01/2023   HGB 11.5 (L)  08/01/2023   HCT 36.5 (L) 08/01/2023   MCV 95.3 08/01/2023   PLT 131 (L) 08/01/2023   Lab Results  Component Value Date   FERRITIN 168 07/13/2023   IRON 60 07/13/2023   TIBC 308 07/13/2023   UIBC 248 07/13/2023   IRONPCTSAT 20 07/13/2023   Lab Results  Component Value Date   RETICCTPCT 1.5 08/01/2023   RBC 3.82 (L) 08/01/2023   No results found for: "KPAFRELGTCHN", "LAMBDASER", "KAPLAMBRATIO" No results found for: "IGGSERUM", "IGA", "IGMSERUM" No results found for: "TOTALPROTELP", "ALBUMINELP", "A1GS", "A2GS", "BETS", "BETA2SER", "GAMS", "MSPIKE", "SPEI"   Chemistry      Component Value Date/Time   NA 142 08/01/2023 1504   NA 141 06/22/2022 0942   NA 144 10/04/2017 0904   NA 141 07/23/2016 1255   K 4.2 08/01/2023 1504   K 3.8 10/04/2017 0904   K 4.1 07/23/2016 1255   CL 105 08/01/2023 1504   CL 103 10/04/2017 0904   CO2 30 08/01/2023 1504   CO2 28 10/04/2017 0904   CO2 27 07/23/2016 1255   BUN 16 08/01/2023 1504   BUN 20 06/22/2022 0942   BUN 14 10/04/2017 0904   BUN 13.8 07/23/2016 1255   CREATININE 1.12 08/01/2023 1504   CREATININE 0.8 10/04/2017 0904   CREATININE 1.0 07/23/2016 1255      Component Value Date/Time   CALCIUM 8.8 (L) 08/01/2023 1504   CALCIUM 9.4 10/04/2017 0904   CALCIUM 9.2 07/23/2016 1255   ALKPHOS 45 08/01/2023 1504   ALKPHOS 64 10/04/2017 0904   ALKPHOS 77 07/23/2016 1255   AST 21 08/01/2023 1504   AST 23 07/23/2016 1255   ALT 13 08/01/2023 1504   ALT 22 10/04/2017 0904   ALT 13 07/23/2016 1255   BILITOT 0.7 08/01/2023 1504   BILITOT 1.13 07/23/2016 1255        Impression and Plan: Brian Mays is a very pleasant 77 yo caucasian gentleman with metastatic low-grade neuroendocrine carcinoma with hepatic metastasis.     Again, his disease becoming more active.  The dotatate PET scan really looked more active.  Again I will have to see if we cannot try another Lutathera.  I will also see about increasing the Somatuline every 2 weeks.  I think this would be reasonable also.  We will have him come back to see [place] in another couple weeks.  Again we will give him a Somatuline injection.  Hopefully, we will be able to have a Lutathera protocol again.  If not, we may consider radiotherapy for his bony lesions.    Josph Macho, MD 9/30/20245:12 PM

## 2023-08-02 LAB — IRON AND IRON BINDING CAPACITY (CC-WL,HP ONLY)
Iron: 53 ug/dL (ref 45–182)
Saturation Ratios: 18 % (ref 17.9–39.5)
TIBC: 297 ug/dL (ref 250–450)
UIBC: 244 ug/dL (ref 117–376)

## 2023-08-02 LAB — CHROMOGRANIN A: Chromogranin A (ng/mL): 161.5 ng/mL — ABNORMAL HIGH (ref 0.0–101.8)

## 2023-08-02 NOTE — Progress Notes (Addendum)
Brian Mays [facility] [address], [address] [place], [place] [address] [phone]  Date:  08/04/2023   Name:  Brian Mays   DOB:  1946/03/06   MRN:  [id number]  PCP:  Pearline Cables, MD    Chief Complaint: Annual Exam (Concerns/ questions: pt mentioned a recent PET scan. New tumors found. 2. Discuss immunizations. 3. Freeze some warts.  /)   History of Present Illness:  Brian Mays is a 77 y.o. 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 Myna Hidalgo on September 30-unfortunately it appears his disease is becoming more active-they plan to see him back in 2 weeks to do a repeat Somatuline injection Principle Diagnosis:  Metastatic low grade neuroendocrine tumor-hepatic metastases Iron deficiency anemia Past Diagnosis:  S/p yttrium-90 intrahepatic therapy - November 2016 Lutathera (Lu 177) injection on 02/01/2018 - s/p cycle 4 SBRT to T6 lesion -- 11/06/2021 Current Therapy:        Somatuline 120 mg monthly IV Iron as indicated  --Venofer given on 06/27/2023 Interim History:  [name] is here today for follow-up.  Unfortunately, it looks like his disease becoming more progressive.  We did do a dotatate PET scan on him.  This was done on 07/22/2023.  This showed that he had new activity in the skeletal lesions in the ribs and pelvis.  He had stable disease in the liver and also  notable metastasis.  Despite increased cancer activity Brian Mays is feeling well.  He is quite active and gets plenty of exercise Flu vaccine- give today  Colonoscopy 2017, patient has discontinued further screening Some recent lab work on chart from hematology-CMP, CBC Can obtain a lipid profile, PSA, TSH, A1c if he would like His cardiologist is Dr. Francisca December was seen in August  Stable systolic murmur   History of heart murmur since about 2009 -stable, as above he is a cardiology patient  Baby aspirin Lipitor 80 Plavix Prevacid Levothyroxine 50 Losartan 100 Metoprolol 12.5 twice daily Ranexa Oral B12 Patient Active Problem List   Diagnosis Date Noted   Metastatic cancer to spine (HCC) 02/08/2022   Prediabetes 09/03/2021   Near syncope 04/22/2020   Iron deficiency anemia due to chronic blood loss 03/29/2019   Iron malabsorption 03/29/2019   Lumbar radiculopathy 08/16/2017   CAD (coronary artery disease) 09/21/2016   Unstable angina (HCC) 02/09/2016   Vitamin D deficiency 03/11/2015   Metastatic malignant neuroendocrine tumor to liver (HCC) 02/14/2015   Hyperglycemia 04/03/2012   Diarrhea 03/29/2012   B12 deficiency 04/19/2011   Antiplatelet or antithrombotic long-term use 03/30/2011   TOBACCO USE, QUIT 10/13/2009   Unspecified vitamin D deficiency 06/19/2009   Neoplasm by body site 12/23/2008   DIVERTICULOSIS, COLON 11/08/2008   ABDOMINAL PAIN 10/02/2008   Hypothyroidism 12/13/2007   HEMORRHOIDS, NOS 12/13/2007   Belching 12/13/2007   History of colonic polyps 12/13/2007   Essential hypertension 09/15/2007   Gastroesophageal reflux disease 09/15/2007   PANCREATITIS,  CHRONIC 09/15/2007   CARDIAC MURMUR 09/15/2007   SNORING 09/15/2007    Past Medical History:  Diagnosis Date   Anemia    in past   Cataract    Colon polyps    Diverticulosis    Gallstones    GERD (gastroesophageal reflux disease)    Heart attack (HCC)    mild, Spring 2017   Heart murmur     Hemorrhoid    Hiatal hernia    HTN (hypertension)    Hyperlipidemia    Hypothyroidism    Iron deficiency anemia due to chronic blood loss 03/29/2019   Iron malabsorption 03/29/2019   Metastatic carcinoma (HCC) 2010   Dr Twanna Hy   Neuroendocrine cancer [facility]) 08/2021   Pancreatitis 1998   chronic    Past Surgical History:  Procedure Laterality Date   APPENDECTOMY  1962   CARDIAC CATHETERIZATION N/A 02/10/2016   Procedure: Left Heart Cath and Coronary Angiography;  Surgeon: Yates Decamp, MD;  Location: [facility] INVASIVE CV LAB;  Service: Cardiovascular;  Laterality: N/A;   CARDIAC CATHETERIZATION  02/10/2016   Procedure: Coronary/Graft Atherectomy;  Surgeon: Yates Decamp, MD;  Location: [facility] INVASIVE CV LAB;  Service: Cardiovascular;;   CARDIAC CATHETERIZATION  02/10/2016   Procedure: Coronary Stent Intervention;  Surgeon: Yates Decamp, MD;  Location: [facility] INVASIVE CV LAB;  Service: Cardiovascular;;   CATARACT EXTRACTION W/ INTRAOCULAR LENS  IMPLANT, BILATERAL Bilateral    CORONARY STENT INTERVENTION N/A 12/16/2021   Procedure: CORONARY STENT INTERVENTION;  Surgeon: Orbie Pyo, MD;  Location: [facility] INVASIVE CV LAB;  Service: Cardiovascular;  Laterality: N/A;   IR GENERIC HISTORICAL  12/30/2015   IR RADIOLOGIST EVAL & MGMT 12/30/2015 Irish Lack, MD [facility] INTERV RAD   IR GENERIC HISTORICAL  11/09/2016   IR RADIOLOGIST EVAL & MGMT 11/09/2016 Irish Lack, MD [facility] INTERV RAD   IR RADIOLOGIST EVAL & MGMT  03/01/2017   IR RADIOLOGIST EVAL & MGMT  06/29/2017   LAPAROSCOPIC CHOLECYSTECTOMY  1999   LEFT HEART CATH AND CORONARY ANGIOGRAPHY N/A 12/16/2021   Procedure: LEFT HEART CATH AND CORONARY ANGIOGRAPHY;  Surgeon: Orbie Pyo, MD;  Location: [facility] INVASIVE CV LAB;  Service: Cardiovascular;  Laterality: N/A;   LIVER BIOPSY  2010   RADIOACTIVE SEED IMPLANT  X 3   "to my liver"   TUMOR EXCISION  01/2009   Carcinoil Resection    TUMOR REMOVAL     from small intestine    Social History   Tobacco Use    Smoking status: Former    Current packs/day: 0.00    Average packs/day: 1.5 packs/day for 38.2 years (57.4 ttl pk-yrs)    Types: Cigarettes    Start date: 09/25/1959    Quit date: 12/21/1997    Years since quitting: 25.6   Smokeless tobacco: Never  Vaping Use   Vaping status: Never Used  Substance Use Topics   Alcohol use: Yes    Alcohol/week: 3.0 - 4.0 standard drinks of alcohol    Types: 3 - 4 Cans of beer per week    Comment: 1-2 beers twice a week   Drug use: No    Family History  Problem Relation Age of Onset   Kidney disease Mother    Hyperlipidemia Mother    Hypertension Mother    COPD Father    Ulcerative colitis Daughter    Colon cancer Neg Hx    Esophageal cancer Neg Hx    Rectal cancer Neg Hx    Stomach cancer Neg Hx  Allergies  Allergen Reactions   Iohexol Hives     Code: HIVES, Desc: PER [name] @ PRIMARY CARE, PT IS ALLERGIC TO CONTRAST DYE 10/02/08/RM  05/01/10...needs full premeds per our protocol w/ [facility]., Onset Date: [id number]    Iodinated Contrast Media Hives    Medication list has been reviewed and updated.  Current Outpatient Medications on File Prior to Visit  Medication Sig Dispense Refill   Artificial Tear Solution (SOOTHE XP OP) Place 1 drop into both eyes daily as needed (dry eyes).     aspirin EC 81 MG tablet Take 81 mg by mouth every morning.     atorvastatin (LIPITOR) 80 MG tablet TAKE 1 TABLET BY MOUTH EVERY DAY 90 tablet 1   Cholecalciferol 1000 UNITS tablet Take 1,000 Units by mouth 2 (two) times daily.     clopidogrel (PLAVIX) 75 MG tablet TAKE 1 TABLET BY MOUTH EVERY DAY 90 tablet 3   diphenhydramine-acetaminophen (TYLENOL PM) 25-500 MG TABS tablet Take 1 tablet by mouth at bedtime as needed (sleep/pain).     fluticasone (FLONASE) 50 MCG/ACT nasal spray Place 2 sprays into both nostrils daily as needed (sinuses).  11   hydrocortisone (ANUSOL-HC) 25 MG suppository INSERT 1 SUPPOSITORY RECTALLY TWICE A DAY AS NEEDED FOR HEMORRHOIDS  30 suppository 0   ipratropium (ATROVENT) 0.03 % nasal spray Place 2 sprays into the nose 3 (three) times daily. Use as needed for nasal drip 30 mL 2   Lanreotide Acetate (SOMATULINE DEPOT [place]) Inject 120 mcg into the skin every 28 (twenty-eight) days. Receives at Dr Gustavo Lah office     lansoprazole (PREVACID) 30 MG capsule Take 1 capsule (30 mg total) by mouth daily at 12 noon. 90 capsule 0   levothyroxine (SYNTHROID) 50 MCG tablet Take 1 tablet (50 mcg total) by mouth daily before breakfast. 90 tablet 0   losartan (COZAAR) 100 MG tablet Take 1 tablet (100 mg total) by mouth daily. 90 tablet 3   metoprolol tartrate (LOPRESSOR) 25 MG tablet TAKE 0.5 TABLETS BY MOUTH 2 TIMES DAILY. 90 tablet 0   nitroGLYCERIN (NITROSTAT) 0.4 MG SL tablet PLACE 1 TABLET UNDER THE TONGUE EVERY 5 (FIVE) MINUTES X 3 DOSES AS NEEDED FOR CHEST PAIN. 25 tablet 4   ranolazine (RANEXA) 500 MG 12 hr tablet Take 1 tablet by mouth twice daily 180 tablet 1   vitamin B-12 (CYANOCOBALAMIN) 1000 MCG tablet Take 1,000 mcg by mouth every other day.     No current facility-administered medications on file prior to visit.    Review of Systems:  As per HPI- otherwise negative.   Physical Examination: Vitals:   08/04/23 1440  BP: 110/60  Pulse: 60  Resp: 18  Temp: 97.9 F (36.6 C)  SpO2: 97%   Vitals:   08/04/23 1440  Weight: 184 lb 9.6 oz (83.7 kg)  Height: 5\' 10"  (1.778 m)   Body mass index is 26.49 kg/m. Ideal Body Weight: Weight in (lb) to have BMI = 25: 173.9  GEN: no acute distress.  Minimal overweight, looks well HEENT: Atraumatic, Normocephalic.  Bilateral TM wnl, oropharynx normal.  PEERL,EOMI.   Ears and Nose: No external deformity. CV: RRR, No M/G/R. No JVD. No thrill. No extra heart sounds. PULM: CTA B, no wheezes, crackles, rhonchi. No retractions. No resp. distress. No accessory muscle use. ABD: S, NT, ND, +BS. No rebound. No HSM. EXTR: No c/c/e PSYCH: Normally interactive. Conversant.  Plantar  warts are present on right foot, he also has a wart on his  right thumb.  Verbal consent obtained, patient would like liquid nitrogen cryotherapy Froze 2 warts right foot and 1 right thumb x 3 cycles each, patient tolerated well with no immediate complication Assessment and Plan: Physical exam  Screening for prostate cancer - Plan: PSA  Acquired hypothyroidism - Plan: TSH  Essential hypertension, benign  B12 deficiency - Plan: Vitamin B12  Elevated glucose - Plan: Hemoglobin A1c  Vitamin D deficiency - Plan: VITAMIN D 25 Hydroxy (Vit-D Deficiency, Fractures)  Coronary artery disease involving native coronary artery of native heart without angina pectoris - Plan: Lipid panel  Physical exam today.  Encouraged healthy diet and exercise routine Lab work is pending as above Froze warts for patient Gave flu shot Signed Abbe Amsterdam, MD  Addendum 10/4, received labs as below.  Message to patient  Results for orders placed or performed in visit on 08/04/23  Hemoglobin A1c  Result Value Ref Range   Hgb A1c MFr Bld 5.9 4.6 - 6.5 %  Lipid panel  Result Value Ref Range   Cholesterol 85 0 - 200 mg/dL   Triglycerides 454.0 0.0 - 149.0 mg/dL   HDL 98.11 >91.47 mg/dL   VLDL 82.9 0.0 - 56.2 mg/dL   LDL Cholesterol 17 0 - 99 mg/dL   Total CHOL/HDL Ratio 2    NonHDL 41.31   Vitamin B12  Result Value Ref Range   Vitamin B-12 539 211 - 911 pg/mL  PSA  Result Value Ref Range   PSA 0.34 0.10 - 4.00 ng/mL  TSH  Result Value Ref Range   TSH 3.48 0.35 - 5.50 uIU/mL  VITAMIN D 25 Hydroxy (Vit-D Deficiency, Fractures)  Result Value Ref Range   VITD 21.78 (L) 30.00 - 100.00 ng/mL   *Note: Due to a large number of results and/or encounters for the requested time period, some results have not been displayed. A complete set of results can be found in Results Review.

## 2023-08-02 NOTE — Patient Instructions (Signed)
It was great to see you again today, I will be in touch with your labs soon as possible Let me know if you want the warts frozen again Flu shot given today Ok to get the RSV at your pharmacy if you wish!

## 2023-08-04 ENCOUNTER — Ambulatory Visit: Payer: Medicare HMO | Admitting: Family Medicine

## 2023-08-04 VITALS — BP 110/60 | HR 60 | Temp 97.9°F | Resp 18 | Ht 70.0 in | Wt 184.6 lb

## 2023-08-04 DIAGNOSIS — I10 Essential (primary) hypertension: Secondary | ICD-10-CM

## 2023-08-04 DIAGNOSIS — E039 Hypothyroidism, unspecified: Secondary | ICD-10-CM

## 2023-08-04 DIAGNOSIS — E559 Vitamin D deficiency, unspecified: Secondary | ICD-10-CM

## 2023-08-04 DIAGNOSIS — Z23 Encounter for immunization: Secondary | ICD-10-CM | POA: Diagnosis not present

## 2023-08-04 DIAGNOSIS — E538 Deficiency of other specified B group vitamins: Secondary | ICD-10-CM

## 2023-08-04 DIAGNOSIS — Z0000 Encounter for general adult medical examination without abnormal findings: Secondary | ICD-10-CM | POA: Diagnosis not present

## 2023-08-04 DIAGNOSIS — R7309 Other abnormal glucose: Secondary | ICD-10-CM | POA: Diagnosis not present

## 2023-08-04 DIAGNOSIS — Z125 Encounter for screening for malignant neoplasm of prostate: Secondary | ICD-10-CM | POA: Diagnosis not present

## 2023-08-04 DIAGNOSIS — I2510 Atherosclerotic heart disease of native coronary artery without angina pectoris: Secondary | ICD-10-CM | POA: Diagnosis not present

## 2023-08-05 ENCOUNTER — Encounter: Payer: Self-pay | Admitting: Family Medicine

## 2023-08-05 LAB — PSA: PSA: 0.34 ng/mL (ref 0.10–4.00)

## 2023-08-05 LAB — LIPID PANEL
Cholesterol: 85 mg/dL (ref 0–200)
HDL: 43.4 mg/dL (ref >39.00)
LDL Cholesterol: 17 mg/dL (ref 0–99)
NonHDL: 41.31
Total CHOL/HDL Ratio: 2
Triglycerides: 120 mg/dL (ref 0.0–149.0)
VLDL: 24 mg/dL (ref 0.0–40.0)

## 2023-08-05 LAB — TSH: TSH: 3.48 u[IU]/mL (ref 0.35–5.50)

## 2023-08-05 LAB — VITAMIN D 25 HYDROXY (VIT D DEFICIENCY, FRACTURES): VITD: 21.78 ng/mL — ABNORMAL LOW (ref 30.00–100.00)

## 2023-08-05 LAB — HEMOGLOBIN A1C: Hgb A1c MFr Bld: 5.9 % (ref 4.6–6.5)

## 2023-08-05 LAB — VITAMIN B12: Vitamin B-12: 539 pg/mL (ref 211–911)

## 2023-08-07 ENCOUNTER — Other Ambulatory Visit: Payer: Self-pay | Admitting: Family Medicine

## 2023-08-07 DIAGNOSIS — E039 Hypothyroidism, unspecified: Secondary | ICD-10-CM

## 2023-08-16 ENCOUNTER — Other Ambulatory Visit: Payer: Self-pay | Admitting: *Deleted

## 2023-08-16 DIAGNOSIS — D500 Iron deficiency anemia secondary to blood loss (chronic): Secondary | ICD-10-CM

## 2023-08-16 DIAGNOSIS — C7B8 Other secondary neuroendocrine tumors: Secondary | ICD-10-CM

## 2023-08-16 DIAGNOSIS — K769 Liver disease, unspecified: Secondary | ICD-10-CM

## 2023-08-17 ENCOUNTER — Inpatient Hospital Stay (HOSPITAL_BASED_OUTPATIENT_CLINIC_OR_DEPARTMENT_OTHER): Payer: Medicare HMO | Admitting: Hematology & Oncology

## 2023-08-17 ENCOUNTER — Encounter: Payer: Self-pay | Admitting: Hematology & Oncology

## 2023-08-17 ENCOUNTER — Other Ambulatory Visit: Payer: Self-pay | Admitting: *Deleted

## 2023-08-17 ENCOUNTER — Inpatient Hospital Stay: Payer: Medicare HMO

## 2023-08-17 ENCOUNTER — Inpatient Hospital Stay: Payer: Medicare HMO | Attending: Hematology & Oncology

## 2023-08-17 VITALS — BP 134/57 | HR 63 | Temp 98.0°F | Resp 20 | Ht 70.0 in | Wt 185.0 lb

## 2023-08-17 DIAGNOSIS — K769 Liver disease, unspecified: Secondary | ICD-10-CM

## 2023-08-17 DIAGNOSIS — C7B8 Other secondary neuroendocrine tumors: Secondary | ICD-10-CM

## 2023-08-17 DIAGNOSIS — D500 Iron deficiency anemia secondary to blood loss (chronic): Secondary | ICD-10-CM

## 2023-08-17 DIAGNOSIS — D509 Iron deficiency anemia, unspecified: Secondary | ICD-10-CM | POA: Insufficient documentation

## 2023-08-17 DIAGNOSIS — C7A8 Other malignant neuroendocrine tumors: Secondary | ICD-10-CM | POA: Insufficient documentation

## 2023-08-17 LAB — CBC WITH DIFFERENTIAL (CANCER CENTER ONLY)
Abs Immature Granulocytes: 0.01 10*3/uL (ref 0.00–0.07)
Basophils Absolute: 0 10*3/uL (ref 0.0–0.1)
Basophils Relative: 1 %
Eosinophils Absolute: 0.4 10*3/uL (ref 0.0–0.5)
Eosinophils Relative: 7 %
HCT: 36.2 % — ABNORMAL LOW (ref 39.0–52.0)
Hemoglobin: 11.5 g/dL — ABNORMAL LOW (ref 13.0–17.0)
Immature Granulocytes: 0 %
Lymphocytes Relative: 14 %
Lymphs Abs: 0.7 10*3/uL (ref 0.7–4.0)
MCH: 30.3 pg (ref 26.0–34.0)
MCHC: 31.8 g/dL (ref 30.0–36.0)
MCV: 95.3 fL (ref 80.0–100.0)
Monocytes Absolute: 0.4 10*3/uL (ref 0.1–1.0)
Monocytes Relative: 7 %
Neutro Abs: 3.9 10*3/uL (ref 1.7–7.7)
Neutrophils Relative %: 71 %
Platelet Count: 140 10*3/uL — ABNORMAL LOW (ref 150–400)
RBC: 3.8 MIL/uL — ABNORMAL LOW (ref 4.22–5.81)
RDW: 14.8 % (ref 11.5–15.5)
WBC Count: 5.4 10*3/uL (ref 4.0–10.5)
nRBC: 0 % (ref 0.0–0.2)

## 2023-08-17 LAB — CMP (CANCER CENTER ONLY)
ALT: 13 U/L (ref 0–44)
AST: 25 U/L (ref 15–41)
Albumin: 3.6 g/dL (ref 3.5–5.0)
Alkaline Phosphatase: 56 U/L (ref 38–126)
Anion gap: 7 (ref 5–15)
BUN: 15 mg/dL (ref 8–23)
CO2: 30 mmol/L (ref 22–32)
Calcium: 9.3 mg/dL (ref 8.9–10.3)
Chloride: 105 mmol/L (ref 98–111)
Creatinine: 1.09 mg/dL (ref 0.61–1.24)
GFR, Estimated: >60 mL/min (ref >60)
Glucose, Bld: 106 mg/dL — ABNORMAL HIGH (ref 70–99)
Potassium: 5 mmol/L (ref 3.5–5.1)
Sodium: 142 mmol/L (ref 135–145)
Total Bilirubin: 0.8 mg/dL (ref 0.3–1.2)
Total Protein: 6.8 g/dL (ref 6.5–8.1)

## 2023-08-17 MED ORDER — LANREOTIDE ACETATE 120 MG/0.5ML ~~LOC~~ SOLN
120.0000 mg | SUBCUTANEOUS | Status: DC
  Administered 2023-08-17: 120 mg via SUBCUTANEOUS
  Filled 2023-08-17: qty 120

## 2023-08-17 NOTE — Patient Instructions (Signed)
Lanreotide Injection What is this medication? LANREOTIDE (lan REE oh tide) treats high levels of growth hormone (acromegaly). It is used when other therapies have not worked well enough or cannot be tolerated. It works by reducing the amount of growth hormone your body makes. This reduces symptoms and the risk of health problems caused by too much growth hormone, such as diabetes and heart disease. It may also be used to treat neuroendocrine tumors, a cancer of the cells that release hormones and other substances in your body. It works by slowing down the release of these substances from the cells. This slows tumor growth. It also decreases the symptoms of carcinoid syndrome, such as flushing or diarrhea. This medicine may be used for other purposes; ask your health care provider or pharmacist if you have questions. COMMON BRAND NAME(S): Somatuline [place] What should I tell my care team before I take this medication? They need to know if you have any of these conditions: Diabetes Gallbladder disease Heart disease Kidney disease Liver disease Thyroid disease An unusual or allergic reaction to lanreotide, other medications, foods, dyes, or preservatives Pregnant or trying to get pregnant Breast-feeding How should I use this medication? This medication is injected under the skin. It is given by your care team in a hospital or clinic setting. Talk to your care team about the use of this medication in children. Special care may be needed. Overdosage: If you think you have taken too much of this medicine contact a poison control center or emergency room at once. NOTE: This medicine is only for you. Do not share this medicine with others. What if I miss a dose? Keep appointments for follow-up doses. It is important not to miss your dose. Call your care team if you are unable to keep an appointment. What may interact with this medication? Bromocriptine Cyclosporine Certain medications for blood  pressure, heart disease, irregular heartbeat Certain medications for diabetes Quinidine Terfenadine This list may not describe all possible interactions. Give your health care provider a list of all the medicines, herbs, non-prescription drugs, or dietary supplements you use. Also tell them if you smoke, drink alcohol, or use illegal drugs. Some items may interact with your medicine. What should I watch for while using this medication? Visit your care team for regular checks on your progress. Tell your care team if your symptoms do not start to get better or if they get worse. Your condition will be monitored carefully while you are receiving this medication. You may need blood work while you are taking this medication. This medication may increase blood sugar. The risk may be higher in patients who already have diabetes. Ask your care team what you can do to lower your risk of diabetes while taking this medication. Talk to your care team if you wish to become pregnant or think you may be pregnant. This medication can cause serious birth defects. Do not breast-feed while taking this medication and for 6 months after stopping therapy. This medication may cause infertility. Talk to your care team if you are concerned about your fertility. What side effects may I notice from receiving this medication? Side effects that you should report to your care team as soon as possible: Allergic reactions--skin rash, itching, hives, swelling of the face, lips, tongue, or throat Gallbladder problems--severe stomach pain, nausea, vomiting, fever High blood sugar (hyperglycemia)--increased thirst or amount of urine, unusual weakness or fatigue, blurry vision Increase in blood pressure Low blood sugar (hypoglycemia)--tremors or shaking, anxiety, sweating, cold   or clammy skin, confusion, dizziness, rapid heartbeat Low thyroid levels (hypothyroidism)--unusual weakness or fatigue, increased sensitivity to cold,  constipation, hair loss, dry skin, weight gain, feelings of depression Slow heartbeat--dizziness, feeling faint or lightheaded, confusion, trouble breathing, unusual weakness or fatigue Side effects that usually do not require medical attention (report to your care team if they continue or are bothersome): Diarrhea Dizziness Headache Muscle spasms Nausea Pain, redness, irritation, or bruising at the injection site Stomach pain This list may not describe all possible side effects. Call your doctor for medical advice about side effects. You may report side effects to FDA at 1-800-FDA-1088. Where should I keep my medication? This medication is given in a hospital or clinic. It will not be stored at home. NOTE: This sheet is a summary. It may not cover all possible information. If you have questions about this medicine, talk to your doctor, pharmacist, or health care provider.  2024 Elsevier/Gold Standard (2022-03-10 00:00:00)  

## 2023-08-17 NOTE — Progress Notes (Signed)
Hematology and Oncology Follow Up Visit  Brian Mays [id number] 09/19/1946 77 y.o. 08/17/2023   Principle Diagnosis:  Metastatic low grade neuroendocrine tumor-hepatic metastases Iron deficiency anemia   Past Diagnosis:  S/p yttrium-90 intrahepatic therapy - November 2016 Lutathera (Lu 177) injection on 02/01/2018 - s/p cycle 4 SBRT to T6 lesion -- 11/06/2021   Current Therapy:        Somatuline 120 mg every 2 week-start on 08/01/2023  IV Iron as indicated  --Venofer given on 06/27/2023  Interim History:  Brian Mays is here today for follow-up.  He is doing pretty well.  He has had no real complaints.  He is exercising.  He is trying to lose little bit of weight.  His last Chromogranin A level was 161.  We are doing his Somatuline every 2 weeks right now.  Hopefully this will help control the neuroendocrine carcinoma.  I have put in a referral for Interventional Radiology to see if they can do another series of Lutathera injections.  He has had no back pain.  He has had no change in bowel or bladder habits.  He has had no diarrhea.  He has had no cough or shortness of breath.  His last iron studies that were done back in September showed a ferritin of 139 with an iron saturation of 18%.  Currently, I would say his performance status is probably ECOG 1.    Medications:  Allergies as of 08/17/2023       Reactions   Iohexol Hives    Code: HIVES, Desc: PER [name] @ [facility]IMARY [facility]ARE, PT IS ALLERGIC TO CONTRAST DYE 10/02/08/RM  05/01/10...needs full premeds per our protocol w/ [facility]., Onset Date: [id number]   Iodinated Contrast Media Hives        Medication List        Accurate as of August 17, 2023 11:10 AM. If you have any questions, ask your nurse or doctor.          aspirin EC 81 MG tablet Take 81 mg by mouth every morning.   atorvastatin 80 MG tablet Commonly known as: LIPITOR TAKE 1 TABLET BY MOUTH EVERY DAY   Cholecalciferol 25 MCG (1000 UT) tablet Take  1,000 Units by mouth 2 (two) times daily.   clopidogrel 75 MG tablet Commonly known as: PLAVIX TAKE 1 TABLET BY MOUTH EVERY DAY   cyanocobalamin 1000 MCG tablet Commonly known as: VITAMIN B12 Take 1,000 mcg by mouth every other day.   diphenhydramine-acetaminophen 25-500 MG Tabs tablet Commonly known as: TYLENOL PM Take 1 tablet by mouth at bedtime as needed (sleep/pain).   fluticasone 50 MCG/ACT nasal spray Commonly known as: FLONASE Place 2 sprays into both nostrils daily as needed (sinuses).   hydrocortisone 25 MG suppository Commonly known as: ANUSOL-HC INSERT 1 SUPPOSITORY RECTALLY TWICE A DAY AS NEEDED FOR HEMORRHOIDS   ipratropium 0.03 % nasal spray Commonly known as: ATROVENT Place 2 sprays into the nose 3 (three) times daily. Use as needed for nasal drip   lansoprazole 30 MG capsule Commonly known as: PREVACID Take 1 capsule (30 mg total) by mouth daily at 12 noon.   levothyroxine 50 MCG tablet Commonly known as: SYNTHROID Take 1 tablet (50 mcg total) by mouth daily before breakfast.   losartan 100 MG tablet Commonly known as: COZAAR Take 1 tablet (100 mg total) by mouth daily.   metoprolol tartrate 25 MG tablet Commonly known as: LOPRESSOR TAKE 0.5 TABLETS BY MOUTH 2 TIMES DAILY.   nitroGLYCERIN 0.4  MG SL tablet Commonly known as: NITROSTAT PLACE 1 TABLET UNDER THE TONGUE EVERY 5 (FIVE) MINUTES X 3 DOSES AS NEEDED FOR CHEST PAIN.   ranolazine 500 MG 12 hr tablet Commonly known as: RANEXA Take 1 tablet by mouth twice daily   SOMATULINE DEPOT [place] Inject 120 mcg into the skin every 28 (twenty-eight) days. Receives at Dr Gustavo Lah office   SOOTHE XP OP Place 1 drop into both eyes daily as needed (dry eyes).        Allergies:  Allergies  Allergen Reactions   Iohexol Hives     Code: HIVES, Desc: PER [name] @ [facility]IMARY [facility]ARE, PT IS ALLERGIC TO CONTRAST DYE 10/02/08/RM  05/01/10...needs full premeds per our protocol w/ [facility]., Onset Date: [id number]     Iodinated Contrast Media Hives    Past Medical History, Surgical history, Social history, and Family History were reviewed and updated.  Review of Systems: Review of Systems  Constitutional: Negative.   HENT: Negative.    Eyes: Negative.   Respiratory: Negative.    Cardiovascular: Negative.   Gastrointestinal: Negative.   Genitourinary: Negative.   Musculoskeletal: Negative.   Skin: Negative.   Neurological: Negative.   Endo/Heme/Allergies: Negative.   Psychiatric/Behavioral: Negative.       Physical Exam:  height is 5\' 10"  (1.778 m) and weight is 185 lb (83.9 kg). His tympanic temperature is 98 F (36.7 C). His blood pressure is 134/57 (abnormal) and his pulse is 63. His respiration is 20 and oxygen saturation is 100%.   Wt Readings from Last 3 Encounters:  08/17/23 185 lb (83.9 kg)  08/04/23 184 lb 9.6 oz (83.7 kg)  08/01/23 184 lb (83.5 kg)    Physical Exam Vitals reviewed.  HENT:     Head: Normocephalic and atraumatic.  Eyes:     Pupils: Pupils are equal, round, and reactive to light.  Cardiovascular:     Rate and Rhythm: Normal rate and regular rhythm.     Heart sounds: Normal heart sounds.  Pulmonary:     Effort: Pulmonary effort is normal.     Breath sounds: Normal breath sounds.  Abdominal:     General: Bowel sounds are normal.     Palpations: Abdomen is soft.  Musculoskeletal:        General: No tenderness or deformity. Normal range of motion.     Cervical back: Normal range of motion.  Lymphadenopathy:     Cervical: No cervical adenopathy.  Skin:    General: Skin is warm and dry.     Findings: No erythema or rash.  Neurological:     Mental Status: He is alert and oriented to person, place, and time.  Psychiatric:        Behavior: Behavior normal.        Thought Content: Thought content normal.        Judgment: Judgment normal.      Lab Results  Component Value Date   WBC 5.4 08/17/2023   HGB 11.5 (L) 08/17/2023   HCT 36.2 (L) 08/17/2023    MCV 95.3 08/17/2023   PLT 140 (L) 08/17/2023   Lab Results  Component Value Date   FERRITIN 139 08/01/2023   IRON 53 08/01/2023   TIBC 297 08/01/2023   UIBC 244 08/01/2023   IRONPCTSAT 18 08/01/2023   Lab Results  Component Value Date   RETICCTPCT 1.5 08/01/2023   RBC 3.80 (L) 08/17/2023   No results found for: "KPAFRELGTCHN", "LAMBDASER", "KAPLAMBRATIO" No results found for: "IGGSERUM", "IGA", "IGMSERUM"  No results found for: "TOTALPROTELP", "ALBUMINELP", "A1GS", "A2GS", "BETS", "BETA2SER", "GAMS", "MSPIKE", "SPEI"   Chemistry      Component Value Date/Time   NA 142 08/17/2023 1008   NA 141 06/22/2022 0942   NA 144 10/04/2017 0904   NA 141 07/23/2016 1255   K 5.0 08/17/2023 1008   K 3.8 10/04/2017 0904   K 4.1 07/23/2016 1255   CL 105 08/17/2023 1008   CL 103 10/04/2017 0904   CO2 30 08/17/2023 1008   CO2 28 10/04/2017 0904   CO2 27 07/23/2016 1255   BUN 15 08/17/2023 1008   BUN 20 06/22/2022 0942   BUN 14 10/04/2017 0904   BUN 13.8 07/23/2016 1255   CREATININE 1.09 08/17/2023 1008   CREATININE 0.8 10/04/2017 0904   CREATININE 1.0 07/23/2016 1255      Component Value Date/Time   CALCIUM 9.3 08/17/2023 1008   CALCIUM 9.4 10/04/2017 0904   CALCIUM 9.2 07/23/2016 1255   ALKPHOS 56 08/17/2023 1008   ALKPHOS 64 10/04/2017 0904   ALKPHOS 77 07/23/2016 1255   AST 25 08/17/2023 1008   AST 23 07/23/2016 1255   ALT 13 08/17/2023 1008   ALT 22 10/04/2017 0904   ALT 13 07/23/2016 1255   BILITOT 0.8 08/17/2023 1008   BILITOT 1.13 07/23/2016 1255       Impression and Plan: Mr. Eberlein is a very pleasant 77 yo caucasian gentleman with metastatic low-grade neuroendocrine carcinoma with hepatic metastasis.     We have increased his Somatuline every 2 weeks.  I will have to see if he can be given another series of Lutathera.  I have yet to get him on chemotherapy.  I would hate to have to do this given that his quality life might go down a little bit.  Of note, today  is his wife's 75th birthday.  She, unfortunately, passed away in 03/25/23 from a glioblastoma.  I know this has been very difficult.  I think he and his family will go out to dinner to celebrate.  We will have him come back to see [place] in another 2 weeks.  Hopefully, he would have seen Intervention Radiology at that time.   Josph Macho, MD 10/16/202411:10 AM

## 2023-08-19 LAB — CHROMOGRANIN A: Chromogranin A (ng/mL): 198.2 ng/mL — ABNORMAL HIGH (ref 0.0–101.8)

## 2023-08-21 ENCOUNTER — Other Ambulatory Visit: Payer: Self-pay | Admitting: Family Medicine

## 2023-08-21 DIAGNOSIS — J3489 Other specified disorders of nose and nasal sinuses: Secondary | ICD-10-CM

## 2023-08-30 ENCOUNTER — Other Ambulatory Visit: Payer: Self-pay | Admitting: Radiation Oncology

## 2023-08-30 MED ORDER — LORAZEPAM 0.5 MG PO TABS
ORAL_TABLET | ORAL | 0 refills | Status: AC
Start: 2023-08-30 — End: ?

## 2023-08-31 ENCOUNTER — Inpatient Hospital Stay: Payer: Medicare HMO

## 2023-08-31 ENCOUNTER — Inpatient Hospital Stay: Payer: Medicare HMO | Admitting: Hematology & Oncology

## 2023-08-31 ENCOUNTER — Encounter: Payer: Self-pay | Admitting: Hematology & Oncology

## 2023-08-31 ENCOUNTER — Other Ambulatory Visit: Payer: Self-pay

## 2023-08-31 VITALS — BP 148/59 | HR 59 | Temp 98.0°F | Resp 18 | Ht 70.0 in | Wt 184.1 lb

## 2023-08-31 DIAGNOSIS — C7A8 Other malignant neuroendocrine tumors: Secondary | ICD-10-CM | POA: Diagnosis not present

## 2023-08-31 DIAGNOSIS — C7B8 Other secondary neuroendocrine tumors: Secondary | ICD-10-CM | POA: Diagnosis not present

## 2023-08-31 DIAGNOSIS — D500 Iron deficiency anemia secondary to blood loss (chronic): Secondary | ICD-10-CM

## 2023-08-31 DIAGNOSIS — D509 Iron deficiency anemia, unspecified: Secondary | ICD-10-CM | POA: Diagnosis not present

## 2023-08-31 LAB — CMP (CANCER CENTER ONLY)
ALT: 13 U/L (ref 0–44)
AST: 22 U/L (ref 15–41)
Albumin: 4.2 g/dL (ref 3.5–5.0)
Alkaline Phosphatase: 57 U/L (ref 38–126)
Anion gap: 6 (ref 5–15)
BUN: 18 mg/dL (ref 8–23)
CO2: 33 mmol/L — ABNORMAL HIGH (ref 22–32)
Calcium: 9.6 mg/dL (ref 8.9–10.3)
Chloride: 102 mmol/L (ref 98–111)
Creatinine: 1.14 mg/dL (ref 0.61–1.24)
GFR, Estimated: >60 mL/min (ref >60)
Glucose, Bld: 88 mg/dL (ref 70–99)
Potassium: 5 mmol/L (ref 3.5–5.1)
Sodium: 141 mmol/L (ref 135–145)
Total Bilirubin: 0.8 mg/dL (ref 0.3–1.2)
Total Protein: 7.3 g/dL (ref 6.5–8.1)

## 2023-08-31 LAB — CBC WITH DIFFERENTIAL (CANCER CENTER ONLY)
Abs Immature Granulocytes: 0.02 10*3/uL (ref 0.00–0.07)
Basophils Absolute: 0 10*3/uL (ref 0.0–0.1)
Basophils Relative: 1 %
Eosinophils Absolute: 0.4 10*3/uL (ref 0.0–0.5)
Eosinophils Relative: 8 %
HCT: 37.3 % — ABNORMAL LOW (ref 39.0–52.0)
Hemoglobin: 11.9 g/dL — ABNORMAL LOW (ref 13.0–17.0)
Immature Granulocytes: 0 %
Lymphocytes Relative: 15 %
Lymphs Abs: 0.8 10*3/uL (ref 0.7–4.0)
MCH: 30.4 pg (ref 26.0–34.0)
MCHC: 31.9 g/dL (ref 30.0–36.0)
MCV: 95.2 fL (ref 80.0–100.0)
Monocytes Absolute: 0.4 10*3/uL (ref 0.1–1.0)
Monocytes Relative: 8 %
Neutro Abs: 3.9 10*3/uL (ref 1.7–7.7)
Neutrophils Relative %: 68 %
Platelet Count: 158 10*3/uL (ref 150–400)
RBC: 3.92 MIL/uL — ABNORMAL LOW (ref 4.22–5.81)
RDW: 14.8 % (ref 11.5–15.5)
WBC Count: 5.6 10*3/uL (ref 4.0–10.5)
nRBC: 0 % (ref 0.0–0.2)

## 2023-08-31 LAB — LACTATE DEHYDROGENASE: LDH: 184 U/L (ref 98–192)

## 2023-08-31 MED ORDER — LANREOTIDE ACETATE 120 MG/0.5ML ~~LOC~~ SOLN
120.0000 mg | SUBCUTANEOUS | Status: DC
  Administered 2023-08-31: 120 mg via SUBCUTANEOUS
  Filled 2023-08-31: qty 120

## 2023-08-31 NOTE — Progress Notes (Signed)
Hematology and Oncology Follow Up Visit  Brian Mays [id number] 07-Oct-1946 77 y.o. 08/31/2023   Principle Diagnosis:  Metastatic low grade neuroendocrine tumor-hepatic metastases Iron deficiency anemia   Past Diagnosis:  S/p yttrium-90 intrahepatic therapy - November 2016 Lutathera (Lu 177) injection on 02/01/2018 - s/p cycle 4 SBRT to T6 lesion -- 11/06/2021   Current Therapy:        Somatuline 120 mg every 2 week-start on 08/01/2023  IV Iron as indicated  --Venofer given on 06/27/2023  Interim History:  Brian Mays is here today for follow-up.  He is doing pretty well.  He has had no real complaints.  He is exercising.  He is trying to lose little bit of weight.  His last Chromogranin A level was 198.  Hopefully, we will be able to get another Lutathera protocol.  He has had no diarrhea.  His stools may be a little bit loose.  He has had no cough or shortness of breath.  He has had no leg swelling.  He has had no cardiac issues.  There is been no palpitations.  His last iron studies that were done back in September showed a ferritin of 139 with an iron saturation of 18%.  He has had no fever.  He has had no bleeding.  Overall, I would say his performance as a prior ECOG 1.  Medications:  Allergies as of 08/31/2023       Reactions   Iohexol Hives    Code: HIVES, Desc: PER [name] @ [facility]IMARY [facility]ARE, PT IS ALLERGIC TO CONTRAST DYE 10/02/08/RM  05/01/10...needs full premeds per our protocol w/ [facility]., Onset Date: [id number]   Iodinated Contrast Media Hives        Medication List        Accurate as of August 31, 2023 12:15 PM. If you have any questions, ask your nurse or doctor.          aspirin EC 81 MG tablet Take 81 mg by mouth every morning.   atorvastatin 80 MG tablet Commonly known as: LIPITOR TAKE 1 TABLET BY MOUTH EVERY DAY   Cholecalciferol 25 MCG (1000 UT) tablet Take 1,000 Units by mouth 2 (two) times daily.   clopidogrel 75 MG tablet Commonly  known as: PLAVIX TAKE 1 TABLET BY MOUTH EVERY DAY   cyanocobalamin 1000 MCG tablet Commonly known as: VITAMIN B12 Take 1,000 mcg by mouth every other day.   diphenhydramine-acetaminophen 25-500 MG Tabs tablet Commonly known as: TYLENOL PM Take 1 tablet by mouth at bedtime as needed (sleep/pain).   fluticasone 50 MCG/ACT nasal spray Commonly known as: FLONASE Place 2 sprays into both nostrils daily as needed (sinuses).   hydrocortisone 25 MG suppository Commonly known as: ANUSOL-HC INSERT 1 SUPPOSITORY RECTALLY TWICE A DAY AS NEEDED FOR HEMORRHOIDS   ipratropium 0.03 % nasal spray Commonly known as: ATROVENT Place 2 sprays into the nose 3 (three) times daily. Use as needed for nasal drip   lansoprazole 30 MG capsule Commonly known as: PREVACID Take 1 capsule (30 mg total) by mouth daily at 12 noon.   levothyroxine 50 MCG tablet Commonly known as: SYNTHROID Take 1 tablet (50 mcg total) by mouth daily before breakfast.   LORazepam 0.5 MG tablet Commonly known as: Ativan 1 tab po 30 minutes prior to radiation or MRI scans   losartan 100 MG tablet Commonly known as: COZAAR Take 1 tablet (100 mg total) by mouth daily.   metoprolol tartrate 25 MG tablet Commonly known as: LOPRESSOR  TAKE 0.5 TABLETS BY MOUTH 2 TIMES DAILY.   nitroGLYCERIN 0.4 MG SL tablet Commonly known as: NITROSTAT PLACE 1 TABLET UNDER THE TONGUE EVERY 5 (FIVE) MINUTES X 3 DOSES AS NEEDED FOR CHEST PAIN.   ranolazine 500 MG 12 hr tablet Commonly known as: RANEXA Take 1 tablet by mouth twice daily   SOMATULINE DEPOT [place] Inject 120 mcg into the skin every 28 (twenty-eight) days. Receives at Dr Gustavo Lah office   SOOTHE XP OP Place 1 drop into both eyes daily as needed (dry eyes).        Allergies:  Allergies  Allergen Reactions   Iohexol Hives     Code: HIVES, Desc: PER [name] @ [facility], PT IS ALLERGIC TO CONTRAST DYE 10/02/08/RM  05/01/10...needs full premeds per our protocol w/ [facility].,  Onset Date: [id number]    Iodinated Contrast Media Hives    Past Medical History, Surgical history, Social history, and Family History were reviewed and updated.  Review of Systems: Review of Systems  Constitutional: Negative.   HENT: Negative.    Eyes: Negative.   Respiratory: Negative.    Cardiovascular: Negative.   Gastrointestinal: Negative.   Genitourinary: Negative.   Musculoskeletal: Negative.   Skin: Negative.   Neurological: Negative.   Endo/Heme/Allergies: Negative.   Psychiatric/Behavioral: Negative.       Physical Exam:  height is 5\' 10"  (1.778 m) and weight is 184 lb 1.3 oz (83.5 kg). His oral temperature is 98 F (36.7 C). His blood pressure is 148/59 (abnormal) and his pulse is 59 (abnormal). His respiration is 18 and oxygen saturation is 100%.   Wt Readings from Last 3 Encounters:  08/31/23 184 lb 1.3 oz (83.5 kg)  08/17/23 185 lb (83.9 kg)  08/04/23 184 lb 9.6 oz (83.7 kg)    Physical Exam Vitals reviewed.  HENT:     Head: Normocephalic and atraumatic.  Eyes:     Pupils: Pupils are equal, round, and reactive to light.  Cardiovascular:     Rate and Rhythm: Normal rate and regular rhythm.     Heart sounds: Normal heart sounds.  Pulmonary:     Effort: Pulmonary effort is normal.     Breath sounds: Normal breath sounds.  Abdominal:     General: Bowel sounds are normal.     Palpations: Abdomen is soft.  Musculoskeletal:        General: No tenderness or deformity. Normal range of motion.     Cervical back: Normal range of motion.  Lymphadenopathy:     Cervical: No cervical adenopathy.  Skin:    General: Skin is warm and dry.     Findings: No erythema or rash.  Neurological:     Mental Status: He is alert and oriented to person, place, and time.  Psychiatric:        Behavior: Behavior normal.        Thought Content: Thought content normal.        Judgment: Judgment normal.      Lab Results  Component Value Date   WBC 5.6 08/31/2023   HGB  11.9 (L) 08/31/2023   HCT 37.3 (L) 08/31/2023   MCV 95.2 08/31/2023   PLT 158 08/31/2023   Lab Results  Component Value Date   FERRITIN 139 08/01/2023   IRON 53 08/01/2023   TIBC 297 08/01/2023   UIBC 244 08/01/2023   IRONPCTSAT 18 08/01/2023   Lab Results  Component Value Date   RETICCTPCT 1.5 08/01/2023   RBC 3.92 (L) 08/31/2023  No results found for: "KPAFRELGTCHN", "LAMBDASER", "KAPLAMBRATIO" No results found for: "IGGSERUM", "IGA", "IGMSERUM" No results found for: "TOTALPROTELP", "ALBUMINELP", "A1GS", "A2GS", "BETS", "BETA2SER", "GAMS", "MSPIKE", "SPEI"   Chemistry      Component Value Date/Time   NA 141 08/31/2023 1004   NA 141 06/22/2022 0942   NA 144 10/04/2017 0904   NA 141 07/23/2016 1255   K 5.0 08/31/2023 1004   K 3.8 10/04/2017 0904   K 4.1 07/23/2016 1255   CL 102 08/31/2023 1004   CL 103 10/04/2017 0904   CO2 33 (H) 08/31/2023 1004   CO2 28 10/04/2017 0904   CO2 27 07/23/2016 1255   BUN 18 08/31/2023 1004   BUN 20 06/22/2022 0942   BUN 14 10/04/2017 0904   BUN 13.8 07/23/2016 1255   CREATININE 1.14 08/31/2023 1004   CREATININE 0.8 10/04/2017 0904   CREATININE 1.0 07/23/2016 1255      Component Value Date/Time   CALCIUM 9.6 08/31/2023 1004   CALCIUM 9.4 10/04/2017 0904   CALCIUM 9.2 07/23/2016 1255   ALKPHOS 57 08/31/2023 1004   ALKPHOS 64 10/04/2017 0904   ALKPHOS 77 07/23/2016 1255   AST 22 08/31/2023 1004   AST 23 07/23/2016 1255   ALT 13 08/31/2023 1004   ALT 22 10/04/2017 0904   ALT 13 07/23/2016 1255   BILITOT 0.8 08/31/2023 1004   BILITOT 1.13 07/23/2016 1255       Impression and Plan: Mr. Mooradian is a very pleasant 77 yo caucasian gentleman with metastatic low-grade neuroendocrine carcinoma with hepatic metastasis.     We have increased his Somatuline every 2 weeks.  I will have to see if he can be given another series of Lutathera.  Currently, he really is not that symptomatic so happy about that.  I think we can now try to get  him back right after Thanksgiving.  I would like to have him back after Thanksgiving so we can enjoy the holiday with his family.   Josph Macho, MD 10/30/202412:15 PM

## 2023-08-31 NOTE — Patient Instructions (Signed)
Lanreotide Injection What is this medication? LANREOTIDE (lan REE oh tide) treats high levels of growth hormone (acromegaly). It is used when other therapies have not worked well enough or cannot be tolerated. It works by reducing the amount of growth hormone your body makes. This reduces symptoms and the risk of health problems caused by too much growth hormone, such as diabetes and heart disease. It may also be used to treat neuroendocrine tumors, a cancer of the cells that release hormones and other substances in your body. It works by slowing down the release of these substances from the cells. This slows tumor growth. It also decreases the symptoms of carcinoid syndrome, such as flushing or diarrhea. This medicine may be used for other purposes; ask your health care provider or pharmacist if you have questions. COMMON BRAND NAME(S): Somatuline [place] What should I tell my care team before I take this medication? They need to know if you have any of these conditions: Diabetes Gallbladder disease Heart disease Kidney disease Liver disease Thyroid disease An unusual or allergic reaction to lanreotide, other medications, foods, dyes, or preservatives Pregnant or trying to get pregnant Breast-feeding How should I use this medication? This medication is injected under the skin. It is given by your care team in a hospital or clinic setting. Talk to your care team about the use of this medication in children. Special care may be needed. Overdosage: If you think you have taken too much of this medicine contact a poison control center or emergency room at once. NOTE: This medicine is only for you. Do not share this medicine with others. What if I miss a dose? Keep appointments for follow-up doses. It is important not to miss your dose. Call your care team if you are unable to keep an appointment. What may interact with this medication? Bromocriptine Cyclosporine Certain medications for blood  pressure, heart disease, irregular heartbeat Certain medications for diabetes Quinidine Terfenadine This list may not describe all possible interactions. Give your health care provider a list of all the medicines, herbs, non-prescription drugs, or dietary supplements you use. Also tell them if you smoke, drink alcohol, or use illegal drugs. Some items may interact with your medicine. What should I watch for while using this medication? Visit your care team for regular checks on your progress. Tell your care team if your symptoms do not start to get better or if they get worse. Your condition will be monitored carefully while you are receiving this medication. You may need blood work while you are taking this medication. This medication may increase blood sugar. The risk may be higher in patients who already have diabetes. Ask your care team what you can do to lower your risk of diabetes while taking this medication. Talk to your care team if you wish to become pregnant or think you may be pregnant. This medication can cause serious birth defects. Do not breast-feed while taking this medication and for 6 months after stopping therapy. This medication may cause infertility. Talk to your care team if you are concerned about your fertility. What side effects may I notice from receiving this medication? Side effects that you should report to your care team as soon as possible: Allergic reactions--skin rash, itching, hives, swelling of the face, lips, tongue, or throat Gallbladder problems--severe stomach pain, nausea, vomiting, fever High blood sugar (hyperglycemia)--increased thirst or amount of urine, unusual weakness or fatigue, blurry vision Increase in blood pressure Low blood sugar (hypoglycemia)--tremors or shaking, anxiety, sweating, cold   or clammy skin, confusion, dizziness, rapid heartbeat Low thyroid levels (hypothyroidism)--unusual weakness or fatigue, increased sensitivity to cold,  constipation, hair loss, dry skin, weight gain, feelings of depression Slow heartbeat--dizziness, feeling faint or lightheaded, confusion, trouble breathing, unusual weakness or fatigue Side effects that usually do not require medical attention (report to your care team if they continue or are bothersome): Diarrhea Dizziness Headache Muscle spasms Nausea Pain, redness, irritation, or bruising at the injection site Stomach pain This list may not describe all possible side effects. Call your doctor for medical advice about side effects. You may report side effects to FDA at 1-800-FDA-1088. Where should I keep my medication? This medication is given in a hospital or clinic. It will not be stored at home. NOTE: This sheet is a summary. It may not cover all possible information. If you have questions about this medicine, talk to your doctor, pharmacist, or health care provider.  2024 Elsevier/Gold Standard (2022-03-10 00:00:00)  

## 2023-09-01 LAB — CHROMOGRANIN A: Chromogranin A (ng/mL): 161.4 ng/mL — ABNORMAL HIGH (ref 0.0–101.8)

## 2023-09-02 ENCOUNTER — Other Ambulatory Visit (HOSPITAL_COMMUNITY): Payer: Self-pay | Admitting: Hematology & Oncology

## 2023-09-02 DIAGNOSIS — C7B8 Other secondary neuroendocrine tumors: Secondary | ICD-10-CM

## 2023-09-06 ENCOUNTER — Other Ambulatory Visit (HOSPITAL_COMMUNITY): Payer: Self-pay | Admitting: Hematology & Oncology

## 2023-09-06 ENCOUNTER — Telehealth: Payer: Self-pay | Admitting: Radiation Therapy

## 2023-09-06 ENCOUNTER — Ambulatory Visit (HOSPITAL_COMMUNITY)
Admission: RE | Admit: 2023-09-06 | Discharge: 2023-09-06 | Disposition: A | Discharge status: Home / Self Care | Payer: Medicare HMO | Source: Ambulatory Visit | Attending: Hematology & Oncology | Admitting: Hematology & Oncology

## 2023-09-06 DIAGNOSIS — D649 Anemia, unspecified: Secondary | ICD-10-CM | POA: Diagnosis not present

## 2023-09-06 DIAGNOSIS — C7A8 Other malignant neuroendocrine tumors: Secondary | ICD-10-CM | POA: Insufficient documentation

## 2023-09-06 DIAGNOSIS — C7B8 Other secondary neuroendocrine tumors: Secondary | ICD-10-CM

## 2023-09-06 DIAGNOSIS — R197 Diarrhea, unspecified: Secondary | ICD-10-CM | POA: Diagnosis not present

## 2023-09-06 NOTE — Telephone Encounter (Signed)
I left a detailed message for Brian Mays about cancelling the Thoracic spine MRI scheduled on 11/11 per the provider's request.    Jalene Mullet R.T.(R)(T) Radiation Special Procedures Navigator     Dorothy Puffer, MD  Austin Miles, MD; Ronny Bacon, PA-C That sounds good - thanks.  JM       Previous Messages    ----- Message ----- From: Brian Mays Sent: 09/06/2023  12:00 PM EST To: Brian Few, MD; Dorothy Puffer, MD; *  Thank you! I am including his provider here as well to let them know and make sure they are in agreement with cancelling. This scan is scheduled as follow-up of treated thoracic spine, last scanned in May.  Thanks again for reaching out and updating [place] on the patient. Brian Mays ----- Message ----- From: Brian Few, MD Sent: 09/06/2023  11:47 AM EST To: Brian Mays  Hello Brian Mays,  I just saw [name] today.  We are going to retreat him with Lu-177 Dotatate,  He has mild multifocal progressive skeletal mets on DOTATATE scan 07/22/23.  He does have an active lesion at T6 and T5.  I dont think he needs the thoracic MR scheduled for 11/11.  Can you cancel that unless you feel necessary. Thank you [name], MD Radiology

## 2023-09-06 NOTE — Consult Note (Signed)
Chief Complaint: Patient with metastatic neuroendocrine tumor was for  evaluation Peptide receptor radiotherapy (PRRT) with [id number] DOTATATE (Lutathera).  Referring Physician(s):Ennever     Patient Status: Brian Mays - Out-pt  History of Present Illness:  70 year with longstanding history metastatic well differentiated neuroendocrine tumor.     Patient had initial yttrium 90 radioembolization for liver metastasis in 2011 and 2016.     Patient subsequently recieived LU  177 DOTATATE therapy completed on 02/01/2018.  After initial treatment with Lu- 177 DOTATATE,  patient had radiographic improvement as well as symptomatic improvement in diarrhea and flushing.     Patient had directed therapy 2 thoracic spine metastasis SBRT 11/06/2021 at the T6 level.     More recently patient has had new multifocal skeletal metastasis on DOTATATE PET scan 07/22/2023.    Patient currently receiving 120 mg Somatuline  every 2 weeks.    Patient has chromogranin A  is relatively stable 161.  Patient mildly anemic with hemoglobin equal 11.9.      Past Medical History:  Diagnosis Date   Anemia    in past   Cataract    Colon polyps    Diverticulosis    Gallstones    GERD (gastroesophageal reflux disease)    Heart attack (HCC)    mild, Spring 2017   Heart murmur    Hemorrhoid    Hiatal hernia    HTN (hypertension)    Hyperlipidemia    Hypothyroidism    Iron deficiency anemia due to chronic blood loss 03/29/2019   Iron malabsorption 03/29/2019   Metastatic carcinoma (HCC) 2010   Dr Twanna Hy   Neuroendocrine cancer [facility]) 08/2021   Pancreatitis 1998   chronic    Past Surgical History:  Procedure Laterality Date   APPENDECTOMY  1962   CARDIAC CATHETERIZATION N/A 02/10/2016   Procedure: Left Heart Cath and Coronary Angiography;  Surgeon: Yates Decamp, MD;  Location: [facility] INVASIVE CV LAB;  Service: Cardiovascular;  Laterality: N/A;   CARDIAC CATHETERIZATION  02/10/2016   Procedure:  Coronary/Graft Atherectomy;  Surgeon: Yates Decamp, MD;  Location: [facility] INVASIVE CV LAB;  Service: Cardiovascular;;   CARDIAC CATHETERIZATION  02/10/2016   Procedure: Coronary Stent Intervention;  Surgeon: Yates Decamp, MD;  Location: [facility] INVASIVE CV LAB;  Service: Cardiovascular;;   CATARACT EXTRACTION W/ INTRAOCULAR LENS  IMPLANT, BILATERAL Bilateral    CORONARY STENT INTERVENTION N/A 12/16/2021   Procedure: CORONARY STENT INTERVENTION;  Surgeon: Orbie Pyo, MD;  Location: [facility] INVASIVE CV LAB;  Service: Cardiovascular;  Laterality: N/A;   IR GENERIC HISTORICAL  12/30/2015   IR RADIOLOGIST EVAL & MGMT 12/30/2015 Irish Lack, MD [facility] INTERV RAD   IR GENERIC HISTORICAL  11/09/2016   IR RADIOLOGIST EVAL & MGMT 11/09/2016 Irish Lack, MD [facility] INTERV RAD   IR RADIOLOGIST EVAL & MGMT  03/01/2017   IR RADIOLOGIST EVAL & MGMT  06/29/2017   LAPAROSCOPIC CHOLECYSTECTOMY  1999   LEFT HEART CATH AND CORONARY ANGIOGRAPHY N/A 12/16/2021   Procedure: LEFT HEART CATH AND CORONARY ANGIOGRAPHY;  Surgeon: Orbie Pyo, MD;  Location: [facility] INVASIVE CV LAB;  Service: Cardiovascular;  Laterality: N/A;   LIVER BIOPSY  2010   RADIOACTIVE SEED IMPLANT  X 3   "to my liver"   TUMOR EXCISION  01/2009   Carcinoil Resection    TUMOR REMOVAL     from small intestine    Allergies: Iohexol and Iodinated contrast media  Medications: Prior to Admission medications   Medication Sig Start Date End  Date Taking? Authorizing Provider  Artificial Tear Solution (SOOTHE XP OP) Place 1 drop into both eyes daily as needed (dry eyes).    [provider]  aspirin EC 81 MG tablet Take 81 mg by mouth every morning.    [provider]  atorvastatin (LIPITOR) 80 MG tablet TAKE 1 TABLET BY MOUTH EVERY DAY 07/15/23   Copland, Gwenlyn Found, MD  Cholecalciferol 1000 UNITS tablet Take 1,000 Units by mouth 2 (two) times daily.    [provider]  clopidogrel (PLAVIX) 75 MG tablet TAKE 1 TABLET BY MOUTH EVERY DAY 07/18/23    Nahser, Deloris Ping, MD  diphenhydramine-acetaminophen (TYLENOL PM) 25-500 MG TABS tablet Take 1 tablet by mouth at bedtime as needed (sleep/pain).    [provider]  fluticasone (FLONASE) 50 MCG/ACT nasal spray Place 2 sprays into both nostrils daily as needed (sinuses). 01/06/15   [provider]  hydrocortisone (ANUSOL-HC) 25 MG suppository INSERT 1 SUPPOSITORY RECTALLY TWICE A DAY AS NEEDED FOR HEMORRHOIDS 02/07/15   Erenest Blank, NP  ipratropium (ATROVENT) 0.03 % nasal spray Place 2 sprays into the nose 3 (three) times daily. Use as needed for nasal drip 08/22/23   Copland, Gwenlyn Found, MD  Lanreotide Acetate (SOMATULINE DEPOT [place]) Inject 120 mcg into the skin every 28 (twenty-eight) days. Receives at Dr Gustavo Lah office    [provider]  lansoprazole (PREVACID) 30 MG capsule Take 1 capsule (30 mg total) by mouth daily at 12 noon. 08/01/23   Copland, Gwenlyn Found, MD  levothyroxine (SYNTHROID) 50 MCG tablet Take 1 tablet (50 mcg total) by mouth daily before breakfast. 08/08/23   Copland, Gwenlyn Found, MD  LORazepam (ATIVAN) 0.5 MG tablet 1 tab po 30 minutes prior to radiation or MRI scans 08/30/23   Ronny Bacon, PA-C  losartan (COZAAR) 100 MG tablet Take 1 tablet (100 mg total) by mouth daily. 06/24/23   Nahser, Deloris Ping, MD  metoprolol tartrate (LOPRESSOR) 25 MG tablet TAKE 0.5 TABLETS BY MOUTH 2 TIMES DAILY. 05/24/23   Nahser, Deloris Ping, MD  nitroGLYCERIN (NITROSTAT) 0.4 MG SL tablet PLACE 1 TABLET UNDER THE TONGUE EVERY 5 (FIVE) MINUTES X 3 DOSES AS NEEDED FOR CHEST PAIN. Patient not taking: Reported on 08/17/2023 12/28/21   Nahser, Deloris Ping, MD  ranolazine (RANEXA) 500 MG 12 hr tablet Take 1 tablet by mouth twice daily 07/18/23   Nahser, Deloris Ping, MD  vitamin B-12 (CYANOCOBALAMIN) 1000 MCG tablet Take 1,000 mcg by mouth every other day.    [provider]     Family History  Problem Relation Age of Onset   Kidney disease Mother    Hyperlipidemia Mother     Hypertension Mother    COPD Father    Ulcerative colitis Daughter    Colon cancer Neg Hx    Esophageal cancer Neg Hx    Rectal cancer Neg Hx    Stomach cancer Neg Hx     Social History   Socioeconomic History   Marital status: Widowed    Spouse name: Not on file   Number of children: 1   Years of education: Not on file   Highest education level: Associate degree: academic program  Occupational History   Occupation: Investment banker, corporate: [employer]  Tobacco Use   Smoking status: Former    Current packs/day: 0.00    Average packs/day: 1.5 packs/day for 38.2 years (57.4 ttl pk-yrs)    Types: Cigarettes    Start date: 09/25/1959  Quit date: 12/21/1997    Years since quitting: 25.7   Smokeless tobacco: Never  Vaping Use   Vaping status: Never Used  Substance and Sexual Activity   Alcohol use: Yes    Alcohol/week: 3.0 - 4.0 standard drinks of alcohol    Types: 3 - 4 Cans of beer per week    Comment: 1-2 beers twice a week   Drug use: No   Sexual activity: Yes  Other Topics Concern   Not on file  Social History Narrative   Regular Exercise -  3 days a week x 90 min    Social Determinants of Health   Financial Resource Strain: Low Risk  (05/28/2023)   Overall Financial Resource Strain (CARDIA)    Difficulty of Paying Living Expenses: Not hard at all  Food Insecurity: No Food Insecurity (05/28/2023)   Hunger Vital Sign    Worried About Running Out of Food in the Last Year: Never true    Ran Out of Food in the Last Year: Never true  Transportation Needs: No Transportation Needs (05/28/2023)   PRAPARE - Administrator, Civil Service (Medical): No    Lack of Transportation (Non-Medical): No  Physical Activity: Sufficiently Active (05/28/2023)   Exercise Vital Sign    Days of Exercise per Week: 3 days    Minutes of Exercise per Session: 130 min  Stress: No Stress Concern Present (05/28/2023)   [employer] of Occupational Health - Occupational  Stress Questionnaire    Feeling of Stress : Not at all  Social Connections: Socially Isolated (05/28/2023)   Social Connection and Isolation Panel [NHANES]    Frequency of Communication with Friends and Family: More than three times a week    Frequency of Social Gatherings with Friends and Family: Twice a week    Attends Religious Services: Never    Database administrator or Organizations: No    Attends Banker Meetings: Never    Marital Status: Widowed    ECOG Status: 0 - Asymptomatic  Review of Systems: A 12 point ROS discussed and pertinent positives are indicated in the HPI above.  All other systems are negative.  Review of Systems  Constitutional:  Negative for fatigue.  Gastrointestinal:  Negative for diarrhea.  Musculoskeletal: Negative.        No bone pain associated with skeletal metastasis  Skin: Negative.  Negative for color change.       No flushing    Vital Signs: There were no vitals taken for this visit.  Physical Exam  Imaging: NM PET DOTATATE SKULL BASE TO MID THIGH  Result Date: 07/25/2023 CLINICAL DATA:  Well differentiated neuroendocrine tumor. Small-bowel neuroendocrine tumor. Status post post peptide receptor radiotherapy (Lutathera) completed 02/01/2018. Yttrium 90 liver directed radio embolization 2016. EXAM: NUCLEAR MEDICINE PET SKULL BASE TO THIGH TECHNIQUE: 3.8 mCi copper 64 DOTATATE was injected intravenously. Full-ring PET imaging was performed from the skull base to thigh after the radiotracer. CT data was obtained and used for attenuation correction and anatomic localization. COMPARISON:  DOTATATE PET scan 02/11/2022 FINDINGS: NECK No radiotracer activity in neck lymph nodes. Incidental CT findings: None CHEST No radiotracer accumulation within mediastinal or hilar lymph nodes. No suspicious pulmonary nodules on the CT scan. Incidental CT finding:None ABDOMEN/PELVIS Again demonstrated multiple intensely radiotracer avid lesions within the  liver. No change in number or intensity compared to prior. For and example large lesion in the lateral segment LEFT hepatic lobe SUV max equal 45 compared SUV  max equal 47. Mass measures 4.9 cm unchanged from prior. Large peripherally radiotracer avid necrotic mass in the inferior RIGHT hepatic lobe is also unchanged. Radiotracer avid lesion in the central mesentery with central calcifications is unchanged in size or activity measuring 2.3 cm on image 147 with SUV max equal 35. Physiologic activity noted in the liver, spleen, adrenal glands and kidneys. Incidental CT findings:None SKELETON There are multiple new radiotracer avid skeletal lesions. For example lesion in the LEFT manubrium with SUV max equal 15.5. Subtle lesion present on comparison exam with SUV max 2.2. Lesion adjacent medial LEFT rib with SUV max equal 5.8. Lesion the LEFT lateral rib on image 245 is new from prior. Again demonstrated lesion in the inferior scapula on the LEFT however activity is drastically increase with SUV max equal 19.9 compared SUV max equal 3.5. New lesion in the central sacrum with SUV max equal 10.1 image 170. Similar lesion in the LEFT aspect of the sacral ala image 22. Incidental CT findings:None IMPRESSION: 1. Multiple new radiotracer avid skeletal lesions in the ribs and pelvis. previous radiotracer avid skeletal lesions are increased activity. Findings consistent progressive neuroendocrine tumor skeletal metastasis. Patient may benefit from a second peptide receptor radiotherapy treatment. 2. Stable intense radiotracer avid NET hepatic metastasis. 3. Stable central mesenteric nodal metastasis. Electronically Signed   By: [name] M.D.   On: 07/25/2023 11:55    Labs:  CBC: Recent Labs    07/13/23 0922 08/01/23 1504 08/17/23 1008 08/31/23 1004  WBC 6.0 6.9 5.4 5.6  HGB 11.7* 11.5* 11.5* 11.9*  HCT 36.7* 36.5* 36.2* 37.3*  PLT 133* 131* 140* 158    COAGS: No results for input(s): "INR", "APTT" in  the last 8760 hours.  BMP: Recent Labs    07/13/23 0922 08/01/23 1504 08/17/23 1008 08/31/23 1004  NA 139 142 142 141  K 4.2 4.2 5.0 5.0  CL 102 105 105 102  CO2 32 30 30 33*  GLUCOSE 96 95 106* 88  BUN 18 16 15 18   CALCIUM 9.5 8.8* 9.3 9.6  CREATININE 1.11 1.12 1.09 1.14  GFRNONAA >60 >60 >60 >60    LIVER FUNCTION TESTS: Recent Labs    07/13/23 0922 08/01/23 1504 08/17/23 1008 08/31/23 1004  BILITOT 0.9 0.7 0.8 0.8  AST 23 21 25 22   ALT 15 13 13 13   ALKPHOS 54 45 56 57  PROT 7.1 7.0 6.8 7.3  ALBUMIN 4.0 3.7 3.6 4.2    TUMOR MARKERS: Recent Labs    07/13/23 0922 08/01/23 1504 08/17/23 1008 08/31/23 1004  CHROMOGA 168.2* 161.5* 198.2* 161.4*    Assessment and Plan:  [Patient is a candidate for peptide receptor radiotherapy.  Patient has well differentiated neuroendocrine tumor identified within the liver and skeleton.  The tumor is positive for DOTATATE/ somatostatin receptors.  Patient has minimal symptoms which may be attributable to carcinoid tumor.  After initial treatment with Lu- 177 DOTATATE,  patient had radiographic improvement as well as symptomatic improvement in diarrhea and flushing.       The patient was counseled on the primary goal of therapy which is prolongation of progression free survival (79% improvement over standard therapy).  Secondary goals would include decrease in tumor burden and decrease in carcinoid symptoms.    Primary of toxicities of therapy were explained to patient including marrow suppression, renal toxicity and hepatic toxicity.  Rare toxicity of myelosuppression and leukemia also explained.  Potential toxicity will be monitored throughout the course of therapy with interval  CBC and CMP laboratory evaluation.    Two therapies will be scheduled  2 months apart over a  six-month interval.  Patient will receive IM Sandostatin injection in the molecular imaging department after each therapy.  Patient will return to oncology  clinic 1 month following each therapy for CBC and CMP and IM Sandostatin injection.   Thank you for this interesting consult.  I greatly enjoyed see  REGINOLD BEALE again and look forward to participating in their care.  A copy of this report was sent to the requesting provider on this date.  Electronically Signed: Patriciaann Clan, MD 09/06/2023, 12:14 PM   I spent a total of    25 Minutes in face to face in clinical consultation, greater than 50% of which was counseling/coordinating care for metastatic neuroendocrine tumor.

## 2023-09-12 ENCOUNTER — Ambulatory Visit (HOSPITAL_COMMUNITY): Payer: Medicare HMO

## 2023-09-19 ENCOUNTER — Ambulatory Visit: Payer: Medicare HMO | Admitting: Radiation Oncology

## 2023-09-27 ENCOUNTER — Other Ambulatory Visit: Payer: Self-pay | Admitting: Cardiovascular Disease

## 2023-09-27 DIAGNOSIS — I10 Essential (primary) hypertension: Secondary | ICD-10-CM

## 2023-10-04 ENCOUNTER — Ambulatory Visit (HOSPITAL_COMMUNITY)
Admission: RE | Admit: 2023-10-04 | Discharge: 2023-10-04 | Disposition: A | Discharge status: Home / Self Care | Payer: Medicare HMO | Source: Ambulatory Visit | Attending: Hematology & Oncology | Admitting: Hematology & Oncology

## 2023-10-04 VITALS — BP 157/67 | HR 57

## 2023-10-04 DIAGNOSIS — C7B8 Other secondary neuroendocrine tumors: Secondary | ICD-10-CM | POA: Insufficient documentation

## 2023-10-04 DIAGNOSIS — C7951 Secondary malignant neoplasm of bone: Secondary | ICD-10-CM | POA: Insufficient documentation

## 2023-10-04 DIAGNOSIS — C7A8 Other malignant neuroendocrine tumors: Secondary | ICD-10-CM | POA: Diagnosis not present

## 2023-10-04 DIAGNOSIS — D649 Anemia, unspecified: Secondary | ICD-10-CM | POA: Diagnosis not present

## 2023-10-04 LAB — CBC WITH DIFFERENTIAL/PLATELET
Abs Immature Granulocytes: 0.02 10*3/uL (ref 0.00–0.07)
Basophils Absolute: 0 10*3/uL (ref 0.0–0.1)
Basophils Relative: 1 %
Eosinophils Absolute: 0.4 10*3/uL (ref 0.0–0.5)
Eosinophils Relative: 7 %
HCT: 33.9 % — ABNORMAL LOW (ref 39.0–52.0)
Hemoglobin: 10.9 g/dL — ABNORMAL LOW (ref 13.0–17.0)
Immature Granulocytes: 0 %
Lymphocytes Relative: 13 %
Lymphs Abs: 0.7 10*3/uL (ref 0.7–4.0)
MCH: 30.4 pg (ref 26.0–34.0)
MCHC: 32.2 g/dL (ref 30.0–36.0)
MCV: 94.4 fL (ref 80.0–100.0)
Monocytes Absolute: 0.3 10*3/uL (ref 0.1–1.0)
Monocytes Relative: 6 %
Neutro Abs: 4 10*3/uL (ref 1.7–7.7)
Neutrophils Relative %: 73 %
Platelets: 143 10*3/uL — ABNORMAL LOW (ref 150–400)
RBC: 3.59 MIL/uL — ABNORMAL LOW (ref 4.22–5.81)
RDW: 14.8 % (ref 11.5–15.5)
WBC: 5.5 10*3/uL (ref 4.0–10.5)
nRBC: 0 % (ref 0.0–0.2)

## 2023-10-04 LAB — COMPREHENSIVE METABOLIC PANEL
ALT: 18 U/L (ref 0–44)
AST: 26 U/L (ref 15–41)
Albumin: 3.5 g/dL (ref 3.5–5.0)
Alkaline Phosphatase: 51 U/L (ref 38–126)
Anion gap: 8 (ref 5–15)
BUN: 20 mg/dL (ref 8–23)
CO2: 26 mmol/L (ref 22–32)
Calcium: 8.9 mg/dL (ref 8.9–10.3)
Chloride: 103 mmol/L (ref 98–111)
Creatinine, Ser: 0.97 mg/dL (ref 0.61–1.24)
GFR, Estimated: >60 mL/min (ref >60)
Glucose, Bld: 120 mg/dL — ABNORMAL HIGH (ref 70–99)
Potassium: 4.3 mmol/L (ref 3.5–5.1)
Sodium: 137 mmol/L (ref 135–145)
Total Bilirubin: 1.3 mg/dL — ABNORMAL HIGH (ref <1.2)
Total Protein: 7 g/dL (ref 6.5–8.1)

## 2023-10-04 MED ORDER — OCTREOTIDE ACETATE 30 MG IM KIT
30.0000 mg | PACK | Freq: Once | INTRAMUSCULAR | Status: AC
  Administered 2023-10-04: 30 mg via INTRAMUSCULAR

## 2023-10-04 MED ORDER — PROCHLORPERAZINE EDISYLATE 10 MG/2ML IJ SOLN: 10.0000 mg | Freq: Four times a day (QID) | INTRAMUSCULAR | Status: DC | PRN

## 2023-10-04 MED ORDER — LUTETIUM LU 177 DOTATATE 370 MBQ/ML IV SOLN
200.0000 | Freq: Once | INTRAVENOUS | Status: AC
  Administered 2023-10-04: 201.6 via INTRAVENOUS

## 2023-10-04 MED ORDER — OCTREOTIDE ACETATE 500 MCG/ML IJ SOLN
500.0000 ug | Freq: Once | INTRAMUSCULAR | Status: DC | PRN
Start: 2023-10-04 — End: 2023-10-10

## 2023-10-04 MED ORDER — ONDANSETRON 8 MG/NS 50 ML IVPB
8.0000 mg | Freq: Once | INTRAVENOUS | Status: AC
  Administered 2023-10-04: 8 mg via INTRAVENOUS
  Filled 2023-10-04: qty 8

## 2023-10-04 MED ORDER — AMINO ACID RADIOPROTECTANT - L-LYSINE 2.5%/L-ARGININE 2.5% IN NS
250.0000 mL/h | INTRAVENOUS | Status: AC
  Administered 2023-10-04: 250 mL/h via INTRAVENOUS
  Filled 2023-10-04: qty 1000

## 2023-10-04 MED ORDER — SODIUM CHLORIDE 0.9 % IV SOLN
8.0000 mg | Freq: Once | INTRAVENOUS | Status: DC
  Filled 2023-10-04: qty 4

## 2023-10-04 MED ORDER — OCTREOTIDE ACETATE 30 MG IM KIT
PACK | INTRAMUSCULAR | Status: AC
Start: 2023-10-04 — End: ?
  Filled 2023-10-04: qty 1

## 2023-10-04 MED ORDER — SODIUM CHLORIDE 0.9 % IV SOLN
500.0000 mL | Freq: Once | INTRAVENOUS | Status: AC
  Administered 2023-10-04: 500 mL via INTRAVENOUS

## 2023-10-04 MED ORDER — ONDANSETRON HCL 8 MG PO TABS: 8.0000 mg | ORAL_TABLET | Freq: Two times a day (BID) | ORAL | 0 refills | Status: DC | PRN

## 2023-10-04 NOTE — Progress Notes (Signed)
CLINICAL DATA: [age] year[age]old [male] with metastatic neuroendocrine tumor. Well differentiated tumor with somatostatin receptor is identified within the [skeleton] by DOTATATE PET CT scan.  Re-treatment for persistent and progressive well differentiated neuroendocrine tumor.   EXAM: NUCLEAR MEDICINE LUTATHERA ADMINISTRATION  TECHNIQUE: Infusion: The nuclear medicine technologist and I personally verified the dose activity ([192] mCi) to be delivered as specified in the written directive (200 mCi), and verified the patient identification via 2 separate methods.  20 gauge IV were started in the antecubital veins. Anti-emetics were administered by nursing staff. Amino acid renal protection was initiated 30 minutes prior to Lu 177 DOTATATE (Lutathera) infusion and continued continuously for 4 hours. Lutathera infusion was administered over 30 minutes.      The total administered dose was [201.6] mCi Lu 177 DOTATATE.    The entire IV tubing, venocatheter, stopcock and syringes was removed in total, placed in a disposal bag and sent for assay of the residual activity, which will be reported at a later time in our EMR by the physics staff. Pressure was applied to the venipuncture sites, and a compression bandage placed. Radiation Safety personnel were present to perform the discharge survey, as detailed on their documentation.    Patient received 30 mg IM long-acting Sandostatin injection 4 hours after Lutathera effusion in the nuclear medicine department.   RADIOPHARMACEUTICALS:   [201.6] mCi Lu 177 DOTATATE   FINDINGS: Diagnosis: [Metastatic neuroendocrine tumor.]    Current Infusion: [1]    Planned Infusions: [2]    Patient reports mild fatigue and nausea this morning..  The patient's most recent blood counts were reviewed and remains a good candidate to proceed with Lutathera.    Patient remains mildly anemic hemoglobin equal 10.9.  Bilirubin mildly elevated 1.3.   Normal renal function.    The patient was situated in an infusion suite and administered Lutathera as above. Patient will follow-up with referring oncologist for interval serum laboratories (CBC and CMP) in approximately 4 weeks.     Patient received 30 mg IM long-acting Sandostatin injection 4 hours after Lutathera effusion in the nuclear medicine department.     IMPRESSION: [First]  Lu 177 DOTATATE treatment for metastatic neuroendocrine tumor. The patient tolerated the infusion well. The patient will return in 8 weeks second final treatment

## 2023-10-04 NOTE — Written Directive (Addendum)
LUTATHERA THERAPY   RADIOPHARMACEUTICAL:  Lutetium 177 Dotatate (Lutathera)     PRESCRIBED DOSE FOR ADMINISTRATION:  200 mCi   ROUTE OFADMINISTRATION:  IV   DIAGNOSIS:  NEUROENDOCRINE TUMOR METASTATIC TO LIVER   REFERRING PHYSICIAN: Arlan Organ, MD   TREATMENT #: RETREAT #1   DATE OF LAST LONG LIVED SOMATOSTATIN INJECTION:   ADDITIONAL PHYSICIAN COMMENTS/NOTES:   AUTHORIZED USER SIGNATURE & TIME STAMP:  Patriciaann Clan, MD   10/04/23    10:33 AM

## 2023-10-05 ENCOUNTER — Ambulatory Visit: Payer: Medicare HMO | Admitting: Hematology & Oncology

## 2023-10-05 ENCOUNTER — Ambulatory Visit: Payer: Medicare HMO

## 2023-10-05 ENCOUNTER — Inpatient Hospital Stay: Payer: Medicare HMO

## 2023-10-13 ENCOUNTER — Other Ambulatory Visit: Payer: Self-pay

## 2023-10-13 DIAGNOSIS — C7B8 Other secondary neuroendocrine tumors: Secondary | ICD-10-CM

## 2023-10-14 ENCOUNTER — Inpatient Hospital Stay: Payer: Medicare HMO | Attending: Hematology & Oncology

## 2023-10-14 ENCOUNTER — Encounter: Payer: Self-pay | Admitting: Hematology & Oncology

## 2023-10-14 ENCOUNTER — Inpatient Hospital Stay (HOSPITAL_BASED_OUTPATIENT_CLINIC_OR_DEPARTMENT_OTHER): Payer: Medicare HMO | Admitting: Hematology & Oncology

## 2023-10-14 ENCOUNTER — Inpatient Hospital Stay: Payer: Medicare HMO

## 2023-10-14 VITALS — BP 131/52 | HR 60 | Temp 98.9°F | Resp 18 | Ht 70.0 in | Wt 186.1 lb

## 2023-10-14 DIAGNOSIS — D509 Iron deficiency anemia, unspecified: Secondary | ICD-10-CM | POA: Insufficient documentation

## 2023-10-14 DIAGNOSIS — C7B8 Other secondary neuroendocrine tumors: Secondary | ICD-10-CM

## 2023-10-14 DIAGNOSIS — D500 Iron deficiency anemia secondary to blood loss (chronic): Secondary | ICD-10-CM

## 2023-10-14 DIAGNOSIS — C7A8 Other malignant neuroendocrine tumors: Secondary | ICD-10-CM | POA: Diagnosis not present

## 2023-10-14 LAB — CMP (CANCER CENTER ONLY)
ALT: 11 U/L (ref 0–44)
AST: 20 U/L (ref 15–41)
Albumin: 3.7 g/dL (ref 3.5–5.0)
Alkaline Phosphatase: 54 U/L (ref 38–126)
Anion gap: 7 (ref 5–15)
BUN: 16 mg/dL (ref 8–23)
CO2: 32 mmol/L (ref 22–32)
Calcium: 9.4 mg/dL (ref 8.9–10.3)
Chloride: 100 mmol/L (ref 98–111)
Creatinine: 1 mg/dL (ref 0.61–1.24)
GFR, Estimated: >60 mL/min (ref >60)
Glucose, Bld: 183 mg/dL — ABNORMAL HIGH (ref 70–99)
Potassium: 4.6 mmol/L (ref 3.5–5.1)
Sodium: 139 mmol/L (ref 135–145)
Total Bilirubin: 0.7 mg/dL (ref <1.2)
Total Protein: 7 g/dL (ref 6.5–8.1)

## 2023-10-14 LAB — CBC WITH DIFFERENTIAL (CANCER CENTER ONLY)
Abs Immature Granulocytes: 0.03 10*3/uL (ref 0.00–0.07)
Basophils Absolute: 0 10*3/uL (ref 0.0–0.1)
Basophils Relative: 1 %
Eosinophils Absolute: 0.3 10*3/uL (ref 0.0–0.5)
Eosinophils Relative: 5 %
HCT: 35.6 % — ABNORMAL LOW (ref 39.0–52.0)
Hemoglobin: 11.5 g/dL — ABNORMAL LOW (ref 13.0–17.0)
Immature Granulocytes: 1 %
Lymphocytes Relative: 6 %
Lymphs Abs: 0.3 10*3/uL — ABNORMAL LOW (ref 0.7–4.0)
MCH: 30.5 pg (ref 26.0–34.0)
MCHC: 32.3 g/dL (ref 30.0–36.0)
MCV: 94.4 fL (ref 80.0–100.0)
Monocytes Absolute: 0.4 10*3/uL (ref 0.1–1.0)
Monocytes Relative: 8 %
Neutro Abs: 4.4 10*3/uL (ref 1.7–7.7)
Neutrophils Relative %: 79 %
Platelet Count: 129 10*3/uL — ABNORMAL LOW (ref 150–400)
RBC: 3.77 MIL/uL — ABNORMAL LOW (ref 4.22–5.81)
RDW: 14.7 % (ref 11.5–15.5)
WBC Count: 5.4 10*3/uL (ref 4.0–10.5)
nRBC: 0 % (ref 0.0–0.2)

## 2023-10-14 LAB — LACTATE DEHYDROGENASE: LDH: 165 U/L (ref 98–192)

## 2023-10-14 MED ORDER — LANREOTIDE ACETATE 120 MG/0.5ML ~~LOC~~ SOLN
120.0000 mg | SUBCUTANEOUS | Status: DC
  Administered 2023-10-14: 120 mg via SUBCUTANEOUS
  Filled 2023-10-14: qty 120

## 2023-10-14 NOTE — Patient Instructions (Signed)
Lanreotide Injection What is this medication? LANREOTIDE (lan REE oh tide) treats high levels of growth hormone (acromegaly). It is used when other therapies have not worked well enough or cannot be tolerated. It works by reducing the amount of growth hormone your body makes. This reduces symptoms and the risk of health problems caused by too much growth hormone, such as diabetes and heart disease. It may also be used to treat neuroendocrine tumors, a cancer of the cells that release hormones and other substances in your body. It works by slowing down the release of these substances from the cells. This slows tumor growth. It also decreases the symptoms of carcinoid syndrome, such as flushing or diarrhea. This medicine may be used for other purposes; ask your health care provider or pharmacist if you have questions. COMMON BRAND NAME(S): Somatuline [place] What should I tell my care team before I take this medication? They need to know if you have any of these conditions: Diabetes Gallbladder disease Heart disease Kidney disease Liver disease Thyroid disease An unusual or allergic reaction to lanreotide, other medications, foods, dyes, or preservatives Pregnant or trying to get pregnant Breast-feeding How should I use this medication? This medication is injected under the skin. It is given by your care team in a hospital or clinic setting. Talk to your care team about the use of this medication in children. Special care may be needed. Overdosage: If you think you have taken too much of this medicine contact a poison control center or emergency room at once. NOTE: This medicine is only for you. Do not share this medicine with others. What if I miss a dose? Keep appointments for follow-up doses. It is important not to miss your dose. Call your care team if you are unable to keep an appointment. What may interact with this medication? Bromocriptine Cyclosporine Certain medications for blood  pressure, heart disease, irregular heartbeat Certain medications for diabetes Quinidine Terfenadine This list may not describe all possible interactions. Give your health care provider a list of all the medicines, herbs, non-prescription drugs, or dietary supplements you use. Also tell them if you smoke, drink alcohol, or use illegal drugs. Some items may interact with your medicine. What should I watch for while using this medication? Visit your care team for regular checks on your progress. Tell your care team if your symptoms do not start to get better or if they get worse. Your condition will be monitored carefully while you are receiving this medication. You may need blood work while you are taking this medication. This medication may increase blood sugar. The risk may be higher in patients who already have diabetes. Ask your care team what you can do to lower your risk of diabetes while taking this medication. Talk to your care team if you wish to become pregnant or think you may be pregnant. This medication can cause serious birth defects. Do not breast-feed while taking this medication and for 6 months after stopping therapy. This medication may cause infertility. Talk to your care team if you are concerned about your fertility. What side effects may I notice from receiving this medication? Side effects that you should report to your care team as soon as possible: Allergic reactions--skin rash, itching, hives, swelling of the face, lips, tongue, or throat Gallbladder problems--severe stomach pain, nausea, vomiting, fever High blood sugar (hyperglycemia)--increased thirst or amount of urine, unusual weakness or fatigue, blurry vision Increase in blood pressure Low blood sugar (hypoglycemia)--tremors or shaking, anxiety, sweating, cold   or clammy skin, confusion, dizziness, rapid heartbeat Low thyroid levels (hypothyroidism)--unusual weakness or fatigue, increased sensitivity to cold,  constipation, hair loss, dry skin, weight gain, feelings of depression Slow heartbeat--dizziness, feeling faint or lightheaded, confusion, trouble breathing, unusual weakness or fatigue Side effects that usually do not require medical attention (report to your care team if they continue or are bothersome): Diarrhea Dizziness Headache Muscle spasms Nausea Pain, redness, irritation, or bruising at the injection site Stomach pain This list may not describe all possible side effects. Call your doctor for medical advice about side effects. You may report side effects to FDA at 1-800-FDA-1088. Where should I keep my medication? This medication is given in a hospital or clinic. It will not be stored at home. NOTE: This sheet is a summary. It may not cover all possible information. If you have questions about this medicine, talk to your doctor, pharmacist, or health care provider.  2024 Elsevier/Gold Standard (2022-03-10 00:00:00)  

## 2023-10-14 NOTE — Progress Notes (Signed)
Hematology and Oncology Follow Up Visit  Brian Mays [id number] 06/02/1946 77 y.o. 10/14/2023   Principle Diagnosis:  Metastatic low grade neuroendocrine tumor-hepatic metastases Iron deficiency anemia   Past Diagnosis:  S/p yttrium-90 intrahepatic therapy - November 2016 Lutathera (Lu 177) injection on 02/01/2018 - s/p cycle 4 SBRT to T6 lesion -- 11/06/2021   Current Therapy:        Somatuline 120 mg every 2 week-start on 08/01/2023  IV Iron as indicated  --Venofer given on 06/27/2023 Lutathera-retreatment -09/06/2023  Interim History:  Brian Mays is here today for follow-up.  He is doing pretty well.  We do have him back on some Lutathera.  He has first treatment on 09/06/2023.  He seems to tolerate this pretty well.  Brian Mays last chromogranin A level, back in late October was 164.  He has had no problems with cough.  He has had no shortness of breath.  He has had no nausea or vomiting.  There has been no change in bowel or bladder habits.  He has had no leg swelling.  There has been no diarrhea.  He has had no bleeding.  There is been no cardiac issues.  Brian Mays last iron studies that were done back in September showed a ferritin of 139 with an iron saturation of 18%.  Currently, I would say Brian Mays performance status is probably ECOG 1.   Medications:  Allergies as of 10/14/2023       Reactions   Iohexol Hives    Code: HIVES, Desc: PER [name] @ [facility], PT IS ALLERGIC TO CONTRAST DYE 10/02/08/RM  05/01/10...needs full premeds per our protocol w/ [facility]., Onset Date: [id number]   Iodinated Contrast Media Hives        Medication List        Accurate as of October 14, 2023  4:51 PM. If you have any questions, ask your nurse or doctor.          aspirin EC 81 MG tablet Take 81 mg by mouth every morning.   atorvastatin 80 MG tablet Commonly known as: LIPITOR TAKE 1 TABLET BY MOUTH EVERY DAY   Cholecalciferol 25 MCG (1000 UT) tablet Take 1,000 Units by mouth 2 (two)  times daily.   clopidogrel 75 MG tablet Commonly known as: PLAVIX TAKE 1 TABLET BY MOUTH EVERY DAY   cyanocobalamin 1000 MCG tablet Commonly known as: VITAMIN B12 Take 1,000 mcg by mouth every other day.   diphenhydramine-acetaminophen 25-500 MG Tabs tablet Commonly known as: TYLENOL PM Take 1 tablet by mouth at bedtime as needed (sleep/pain).   fluticasone 50 MCG/ACT nasal spray Commonly known as: FLONASE Place 2 sprays into both nostrils daily as needed (sinuses).   hydrocortisone 25 MG suppository Commonly known as: ANUSOL-HC INSERT 1 SUPPOSITORY RECTALLY TWICE A DAY AS NEEDED FOR HEMORRHOIDS   ipratropium 0.03 % nasal spray Commonly known as: ATROVENT Place 2 sprays into the nose 3 (three) times daily. Use as needed for nasal drip   lansoprazole 30 MG capsule Commonly known as: PREVACID Take 1 capsule (30 mg total) by mouth daily at 12 noon.   levothyroxine 50 MCG tablet Commonly known as: SYNTHROID Take 1 tablet (50 mcg total) by mouth daily before breakfast.   LORazepam 0.5 MG tablet Commonly known as: Ativan 1 tab po 30 minutes prior to radiation or MRI scans   losartan 100 MG tablet Commonly known as: COZAAR Take 1 tablet (100 mg total) by mouth daily.   metoprolol tartrate 25 MG tablet  Commonly known as: LOPRESSOR TAKE 1/2 TABLET TWICE A DAY BY MOUTH   nitroGLYCERIN 0.4 MG SL tablet Commonly known as: NITROSTAT PLACE 1 TABLET UNDER THE TONGUE EVERY 5 (FIVE) MINUTES X 3 DOSES AS NEEDED FOR CHEST PAIN.   ondansetron 8 MG tablet Commonly known as: ZOFRAN Take 1 tablet (8 mg total) by mouth 2 (two) times daily as needed for nausea or vomiting.   ranolazine 500 MG 12 hr tablet Commonly known as: RANEXA Take 1 tablet by mouth twice daily   SOMATULINE DEPOT [place] Inject 120 mcg into the skin every 28 (twenty-eight) days. Receives at Dr Gustavo Lah office   SOOTHE XP OP Place 1 drop into both eyes daily as needed (dry eyes).        Allergies:   Allergies  Allergen Reactions   Iohexol Hives     Code: HIVES, Desc: PER [name] @ [facility], PT IS ALLERGIC TO CONTRAST DYE 10/02/08/RM  05/01/10...needs full premeds per our protocol w/ [facility]., Onset Date: [id number]    Iodinated Contrast Media Hives    Past Medical History, Surgical history, Social history, and Family History were reviewed and updated.  Review of Systems: Review of Systems  Constitutional: Negative.   HENT: Negative.    Eyes: Negative.   Respiratory: Negative.    Cardiovascular: Negative.   Gastrointestinal: Negative.   Genitourinary: Negative.   Musculoskeletal: Negative.   Skin: Negative.   Neurological: Negative.   Endo/Heme/Allergies: Negative.   Psychiatric/Behavioral: Negative.       Physical Exam:  height is 5\' 10"  (1.778 m) and weight is 186 lb 1.9 oz (84.4 kg). Brian Mays oral temperature is 98.9 F (37.2 C). Brian Mays blood pressure is 131/52 (abnormal) and Brian Mays pulse is 60. Brian Mays respiration is 18 and oxygen saturation is 100%.   Wt Readings from Last 3 Encounters:  10/14/23 186 lb 1.9 oz (84.4 kg)  08/31/23 184 lb 1.3 oz (83.5 kg)  08/17/23 185 lb (83.9 kg)    Physical Exam Vitals reviewed.  HENT:     Head: Normocephalic and atraumatic.  Eyes:     Pupils: Pupils are equal, round, and reactive to light.  Cardiovascular:     Rate and Rhythm: Normal rate and regular rhythm.     Heart sounds: Normal heart sounds.  Pulmonary:     Effort: Pulmonary effort is normal.     Breath sounds: Normal breath sounds.  Abdominal:     General: Bowel sounds are normal.     Palpations: Abdomen is soft.  Musculoskeletal:        General: No tenderness or deformity. Normal range of motion.     Cervical back: Normal range of motion.  Lymphadenopathy:     Cervical: No cervical adenopathy.  Skin:    General: Skin is warm and dry.     Findings: No erythema or rash.  Neurological:     Mental Status: He is alert and oriented to person, place, and time.  Psychiatric:         Behavior: Behavior normal.        Thought Content: Thought content normal.        Judgment: Judgment normal.      Lab Results  Component Value Date   WBC 5.4 10/14/2023   HGB 11.5 (L) 10/14/2023   HCT 35.6 (L) 10/14/2023   MCV 94.4 10/14/2023   PLT 129 (L) 10/14/2023   Lab Results  Component Value Date   FERRITIN 139 08/01/2023   IRON 53 08/01/2023  TIBC 297 08/01/2023   UIBC 244 08/01/2023   IRONPCTSAT 18 08/01/2023   Lab Results  Component Value Date   RETICCTPCT 1.5 08/01/2023   RBC 3.77 (L) 10/14/2023   No results found for: "KPAFRELGTCHN", "LAMBDASER", "KAPLAMBRATIO" No results found for: "IGGSERUM", "IGA", "IGMSERUM" No results found for: "TOTALPROTELP", "ALBUMINELP", "A1GS", "A2GS", "BETS", "BETA2SER", "GAMS", "MSPIKE", "SPEI"   Chemistry      Component Value Date/Time   NA 139 10/14/2023 1005   NA 141 06/22/2022 0942   NA 144 10/04/2017 0904   NA 141 07/23/2016 1255   K 4.6 10/14/2023 1005   K 3.8 10/04/2017 0904   K 4.1 07/23/2016 1255   CL 100 10/14/2023 1005   CL 103 10/04/2017 0904   CO2 32 10/14/2023 1005   CO2 28 10/04/2017 0904   CO2 27 07/23/2016 1255   BUN 16 10/14/2023 1005   BUN 20 06/22/2022 0942   BUN 14 10/04/2017 0904   BUN 13.8 07/23/2016 1255   CREATININE 1.00 10/14/2023 1005   CREATININE 0.8 10/04/2017 0904   CREATININE 1.0 07/23/2016 1255      Component Value Date/Time   CALCIUM 9.4 10/14/2023 1005   CALCIUM 9.4 10/04/2017 0904   CALCIUM 9.2 07/23/2016 1255   ALKPHOS 54 10/14/2023 1005   ALKPHOS 64 10/04/2017 0904   ALKPHOS 77 07/23/2016 1255   AST 20 10/14/2023 1005   AST 23 07/23/2016 1255   ALT 11 10/14/2023 1005   ALT 22 10/04/2017 0904   ALT 13 07/23/2016 1255   BILITOT 0.7 10/14/2023 1005   BILITOT 1.13 07/23/2016 1255       Impression and Plan: Brian Mays is a very pleasant 77 yo caucasian gentleman with metastatic low-grade neuroendocrine carcinoma with hepatic metastasis.   We now were able to get him  back on Lutathera.  He did get Somatuline today.  I know he will have a nice Christmas with Brian Mays family.  Will have him come back in a month for another Somatuline injection.   Josph Macho, MD 12/13/20244:51 PM

## 2023-10-14 NOTE — Progress Notes (Signed)
Somatuline due q 2 weeks. Ok to proceed with Somatuline today, a few days early from 10/04/23 Sandostatin injection.  Anola Gurney Cal[place], [place], BCPS, BCOP 10/14/2023 11:50 AM

## 2023-10-18 LAB — CHROMOGRANIN A: Chromogranin A (ng/mL): 173 ng/mL — ABNORMAL HIGH (ref 0.0–101.8)

## 2023-10-24 ENCOUNTER — Ambulatory Visit (INDEPENDENT_AMBULATORY_CARE_PROVIDER_SITE_OTHER): Payer: Medicare HMO | Admitting: Family Medicine

## 2023-10-24 VITALS — BP 132/60 | HR 62 | Temp 98.4°F | Resp 18 | Ht 70.0 in | Wt 185.2 lb

## 2023-10-24 DIAGNOSIS — J0110 Acute frontal sinusitis, unspecified: Secondary | ICD-10-CM | POA: Diagnosis not present

## 2023-10-24 DIAGNOSIS — B070 Plantar wart: Secondary | ICD-10-CM | POA: Diagnosis not present

## 2023-10-24 MED ORDER — AMOXICILLIN 500 MG PO CAPS: 1000.0000 mg | ORAL_CAPSULE | Freq: Two times a day (BID) | ORAL | 0 refills | Status: DC

## 2023-10-24 NOTE — Patient Instructions (Signed)
Good to see you again today- I hope you are feeling much better soon!  Let me know if you are not getting back to normal in the next few days We can re-freeze plantar warts in a few weeks if needed

## 2023-10-24 NOTE — Progress Notes (Signed)
[facility] [address] [place], [place] [address] [phone]  Date:  10/24/2023   Name:  Brian Mays   DOB:  [birth year]   MRN:  [id number]  PCP:  Pearline Cables, MD    Chief Complaint: Sinus Problem (Head congestion, blood in mucus from the nose. X about 2 weeks. Pt has tried OTC Mucus relief- this has seemed to help some. He has not used the nasal sprays on his med list./Concerns/ questions: Plantar Warts- he says these  have been frozen before. )   History of Present Illness:  Brian Mays is a 77 y.o. very pleasant male patient who presents with the following:  Last seen by myself for his CPE in October  history of metastatic malignant neuroendocrine tumor in the liver/carcinoid syndrome status post partial small bowel resection in 2010, as well as iron malabsorption, CAD status post stenting in 2017, hypothyroidism, hypertension, chronic pancreatitis    He notes some sinus congestion, sinus pressure for about 2 weeks He overall feels like he is getting better but still has persistent sinus congestion Blowing material out of his nose Cough is better No fever noted- he is not really sure No body aches or chills No GI symptoms  He is using some OTC cold meds  He would like to work on plantar warts today-we x-rays and these before, he would like to go ahead and freeze them again  He did a cancer treatment on 12/13-he follows up regularly with Dr. Myna Hidalgo Principle Diagnosis:  Metastatic low grade neuroendocrine tumor-hepatic metastases Iron deficiency anemia Past Diagnosis:  S/p yttrium-90 intrahepatic therapy - November 2016 Lutathera (Lu 177) injection on 02/01/2018 - s/p cycle 4 SBRT to T6 lesion -- 11/06/2021 Current Therapy:        Somatuline 120 mg every 2 week-start on 08/01/2023  IV Iron as indicated  --Venofer given on 06/27/2023 Lutathera-retreatment -09/06/2023   Patient Active Problem  List   Diagnosis Date Noted   Metastatic cancer to spine (HCC) 02/08/2022   Prediabetes 09/03/2021   Near syncope 04/22/2020   Iron deficiency anemia due to chronic blood loss 03/29/2019   Iron malabsorption 03/29/2019   Lumbar radiculopathy 08/16/2017   CAD (coronary artery disease) 09/21/2016   Unstable angina (HCC) 02/09/2016   Vitamin D deficiency 03/11/2015   Metastatic malignant neuroendocrine tumor to liver (HCC) 02/14/2015   Hyperglycemia 04/03/2012   Diarrhea 03/29/2012   B12 deficiency 04/19/2011   Antiplatelet or antithrombotic long-term use 03/30/2011   TOBACCO USE, QUIT 10/13/2009   Unspecified vitamin D deficiency 06/19/2009   Neoplasm by body site 12/23/2008   DIVERTICULOSIS, COLON 11/08/2008   ABDOMINAL PAIN 10/02/2008   Hypothyroidism 12/13/2007   HEMORRHOIDS, NOS 12/13/2007   Belching 12/13/2007   History of colonic polyps 12/13/2007   Essential hypertension 09/15/2007   Gastroesophageal reflux disease 09/15/2007   PANCREATITIS, CHRONIC 09/15/2007   CARDIAC MURMUR 09/15/2007   SNORING 09/15/2007    Past Medical History:  Diagnosis Date   Anemia    in past   Cataract    Colon polyps    Diverticulosis    Gallstones    GERD (gastroesophageal reflux disease)    Heart attack (HCC)    mild, Spring 2017   Heart murmur    Hemorrhoid    Hiatal hernia    HTN (hypertension)    Hyperlipidemia    Hypothyroidism    Iron deficiency anemia due to chronic blood  loss 03/29/2019   Iron malabsorption 03/29/2019   Metastatic carcinoma (HCC) 2010   Dr Twanna Hy   Neuroendocrine cancer [facility]) 08/2021   Pancreatitis 1998   chronic    Past Surgical History:  Procedure Laterality Date   APPENDECTOMY  1962   CARDIAC CATHETERIZATION N/A 02/10/2016   Procedure: Left Heart Cath and Coronary Angiography;  Surgeon: Yates Decamp, MD;  Location: [facility] INVASIVE CV LAB;  Service: Cardiovascular;  Laterality: N/A;   CARDIAC CATHETERIZATION  02/10/2016   Procedure: Coronary/Graft  Atherectomy;  Surgeon: Yates Decamp, MD;  Location: [facility] INVASIVE CV LAB;  Service: Cardiovascular;;   CARDIAC CATHETERIZATION  02/10/2016   Procedure: Coronary Stent Intervention;  Surgeon: Yates Decamp, MD;  Location: [facility] INVASIVE CV LAB;  Service: Cardiovascular;;   CATARACT EXTRACTION W/ INTRAOCULAR LENS  IMPLANT, BILATERAL Bilateral    CORONARY STENT INTERVENTION N/A 12/16/2021   Procedure: CORONARY STENT INTERVENTION;  Surgeon: Orbie Pyo, MD;  Location: [facility] INVASIVE CV LAB;  Service: Cardiovascular;  Laterality: N/A;   IR GENERIC HISTORICAL  12/30/2015   IR RADIOLOGIST EVAL & MGMT 12/30/2015 Irish Lack, MD [facility] INTERV RAD   IR GENERIC HISTORICAL  11/09/2016   IR RADIOLOGIST EVAL & MGMT 11/09/2016 Irish Lack, MD [facility] INTERV RAD   IR RADIOLOGIST EVAL & MGMT  03/01/2017   IR RADIOLOGIST EVAL & MGMT  06/29/2017   LAPAROSCOPIC CHOLECYSTECTOMY  1999   LEFT HEART CATH AND CORONARY ANGIOGRAPHY N/A 12/16/2021   Procedure: LEFT HEART CATH AND CORONARY ANGIOGRAPHY;  Surgeon: Orbie Pyo, MD;  Location: [facility] INVASIVE CV LAB;  Service: Cardiovascular;  Laterality: N/A;   LIVER BIOPSY  2010   RADIOACTIVE SEED IMPLANT  X 3   "to my liver"   TUMOR EXCISION  01/2009   Carcinoil Resection    TUMOR REMOVAL     from small intestine    Social History   Tobacco Use   Smoking status: Former    Current packs/day: 0.00    Average packs/day: 1.5 packs/day for 38.2 years (57.4 ttl pk-yrs)    Types: Cigarettes    Start date: 09/25/1959    Quit date: 12/21/1997    Years since quitting: 25.8   Smokeless tobacco: Never  Vaping Use   Vaping status: Never Used  Substance Use Topics   Alcohol use: Yes    Alcohol/week: 3.0 - 4.0 standard drinks of alcohol    Types: 3 - 4 Cans of beer per week    Comment: 1-2 beers twice a week   Drug use: No    Family History  Problem Relation Age of Onset   Kidney disease Mother    Hyperlipidemia Mother    Hypertension Mother    COPD Father    Ulcerative colitis  Daughter    Colon cancer Neg Hx    Esophageal cancer Neg Hx    Rectal cancer Neg Hx    Stomach cancer Neg Hx     Allergies  Allergen Reactions   Iohexol Hives     Code: HIVES, Desc: PER [name] @ PRIMARY CARE, PT IS ALLERGIC TO CONTRAST DYE 10/02/08/RM  05/01/10...needs full premeds per our protocol w/ [facility]., Onset Date: 16109604    Iodinated Contrast Media Hives    Medication list has been reviewed and updated.  Current Outpatient Medications on File Prior to Visit  Medication Sig Dispense Refill   Artificial Tear Solution (SOOTHE XP OP) Place 1 drop into both eyes daily as needed (dry eyes).     aspirin EC 81  MG tablet Take 81 mg by mouth every morning.     atorvastatin (LIPITOR) 80 MG tablet TAKE 1 TABLET BY MOUTH EVERY DAY 90 tablet 1   Cholecalciferol 1000 UNITS tablet Take 1,000 Units by mouth 2 (two) times daily.     clopidogrel (PLAVIX) 75 MG tablet TAKE 1 TABLET BY MOUTH EVERY DAY 90 tablet 3   diphenhydramine-acetaminophen (TYLENOL PM) 25-500 MG TABS tablet Take 1 tablet by mouth at bedtime as needed (sleep/pain).     fluticasone (FLONASE) 50 MCG/ACT nasal spray Place 2 sprays into both nostrils daily as needed (sinuses).  11   hydrocortisone (ANUSOL-HC) 25 MG suppository INSERT 1 SUPPOSITORY RECTALLY TWICE A DAY AS NEEDED FOR HEMORRHOIDS 30 suppository 0   ipratropium (ATROVENT) 0.03 % nasal spray Place 2 sprays into the nose 3 (three) times daily. Use as needed for nasal drip 90 mL 1   Lanreotide Acetate (SOMATULINE DEPOT [place]) Inject 120 mcg into the skin every 28 (twenty-eight) days. Receives at Dr Gustavo Lah office     lansoprazole (PREVACID) 30 MG capsule Take 1 capsule (30 mg total) by mouth daily at 12 noon. 90 capsule 0   levothyroxine (SYNTHROID) 50 MCG tablet Take 1 tablet (50 mcg total) by mouth daily before breakfast. 90 tablet 1   LORazepam (ATIVAN) 0.5 MG tablet 1 tab po 30 minutes prior to radiation or MRI scans 5 tablet 0   losartan (COZAAR) 100 MG tablet Take  1 tablet (100 mg total) by mouth daily. 90 tablet 3   metoprolol tartrate (LOPRESSOR) 25 MG tablet TAKE 1/2 TABLET TWICE A DAY BY MOUTH 90 tablet 2   nitroGLYCERIN (NITROSTAT) 0.4 MG SL tablet PLACE 1 TABLET UNDER THE TONGUE EVERY 5 (FIVE) MINUTES X 3 DOSES AS NEEDED FOR CHEST PAIN. 25 tablet 4   ondansetron (ZOFRAN) 8 MG tablet Take 1 tablet (8 mg total) by mouth 2 (two) times daily as needed for nausea or vomiting. 20 tablet 0   ranolazine (RANEXA) 500 MG 12 hr tablet Take 1 tablet by mouth twice daily 180 tablet 1   vitamin B-12 (CYANOCOBALAMIN) 1000 MCG tablet Take 1,000 mcg by mouth every other day.     No current facility-administered medications on file prior to visit.    Review of Systems:  As per HPI- otherwise negative.   Physical Examination: Vitals:   10/24/23 0844  BP: 132/60  Pulse: 62  Resp: 18  Temp: 98.4 F (36.9 C)  SpO2: 98%   Vitals:   10/24/23 0844  Weight: 185 lb 3.2 oz (84 kg)  Height: 5\' 10"  (1.778 m)   Body mass index is 26.57 kg/m. Ideal Body Weight: Weight in (lb) to have BMI = 25: 173.9  GEN: no acute distress.  Minimal overweight, looks well HEENT: Atraumatic, Normocephalic. Bilateral TM wnl, oropharynx normal.  PEERL,EOMI. nasal cavity inflamed with mucus bilaterally Ears and Nose: No external deformity. CV: RRR, No M/G/R. No JVD. No thrill. No extra heart sounds. PULM: CTA B, no wheezes, crackles, rhonchi. No retractions. No resp. distress. No accessory muscle use. EXTR: No c/c/e PSYCH: Normally interactive. Conversant.  Right foot: Patient notes 2 plantar warts on the ball of the foot  Verbal consent obtained.  Cryotherapy with liquid nitrogen x 3 cycles each, patient tolerated well with no immediate complications  Assessment and Plan: Acute non-recurrent frontal sinusitis - Plan: amoxicillin (AMOXIL) 500 MG capsule  Plantar wart of right foot Patient seen today with concern of sinusitis, symptoms are about 2 weeks.  Will treat  with  amoxicillin, as he will let me know if not improving  Treated plantar warts with cryotherapy  Signed Abbe Amsterdam, MD

## 2023-10-29 ENCOUNTER — Other Ambulatory Visit: Payer: Self-pay | Admitting: Family Medicine

## 2023-11-14 ENCOUNTER — Inpatient Hospital Stay: Payer: Medicare HMO | Attending: Hematology & Oncology

## 2023-11-14 ENCOUNTER — Inpatient Hospital Stay: Payer: Medicare HMO

## 2023-11-14 ENCOUNTER — Inpatient Hospital Stay (HOSPITAL_BASED_OUTPATIENT_CLINIC_OR_DEPARTMENT_OTHER): Payer: Medicare HMO | Admitting: Family

## 2023-11-14 ENCOUNTER — Encounter: Payer: Self-pay | Admitting: Family

## 2023-11-14 VITALS — BP 161/52 | HR 60 | Temp 98.2°F | Resp 18 | Ht 70.0 in | Wt 187.0 lb

## 2023-11-14 DIAGNOSIS — C7B8 Other secondary neuroendocrine tumors: Secondary | ICD-10-CM | POA: Insufficient documentation

## 2023-11-14 DIAGNOSIS — C7A8 Other malignant neuroendocrine tumors: Secondary | ICD-10-CM

## 2023-11-14 DIAGNOSIS — D509 Iron deficiency anemia, unspecified: Secondary | ICD-10-CM | POA: Diagnosis not present

## 2023-11-14 DIAGNOSIS — C7951 Secondary malignant neoplasm of bone: Secondary | ICD-10-CM

## 2023-11-14 DIAGNOSIS — K769 Liver disease, unspecified: Secondary | ICD-10-CM

## 2023-11-14 DIAGNOSIS — D500 Iron deficiency anemia secondary to blood loss (chronic): Secondary | ICD-10-CM

## 2023-11-14 LAB — CBC WITH DIFFERENTIAL (CANCER CENTER ONLY)
Abs Immature Granulocytes: 0.03 10*3/uL (ref 0.00–0.07)
Basophils Absolute: 0 10*3/uL (ref 0.0–0.1)
Basophils Relative: 1 %
Eosinophils Absolute: 0.3 10*3/uL (ref 0.0–0.5)
Eosinophils Relative: 6 %
HCT: 34.9 % — ABNORMAL LOW (ref 39.0–52.0)
Hemoglobin: 11.5 g/dL — ABNORMAL LOW (ref 13.0–17.0)
Immature Granulocytes: 1 %
Lymphocytes Relative: 11 %
Lymphs Abs: 0.6 10*3/uL — ABNORMAL LOW (ref 0.7–4.0)
MCH: 31 pg (ref 26.0–34.0)
MCHC: 33 g/dL (ref 30.0–36.0)
MCV: 94.1 fL (ref 80.0–100.0)
Monocytes Absolute: 0.4 10*3/uL (ref 0.1–1.0)
Monocytes Relative: 7 %
Neutro Abs: 4 10*3/uL (ref 1.7–7.7)
Neutrophils Relative %: 74 %
Platelet Count: 140 10*3/uL — ABNORMAL LOW (ref 150–400)
RBC: 3.71 MIL/uL — ABNORMAL LOW (ref 4.22–5.81)
RDW: 14.9 % (ref 11.5–15.5)
WBC Count: 5.3 10*3/uL (ref 4.0–10.5)
nRBC: 0 % (ref 0.0–0.2)

## 2023-11-14 LAB — CMP (CANCER CENTER ONLY)
ALT: 11 U/L (ref 0–44)
AST: 19 U/L (ref 15–41)
Albumin: 3.9 g/dL (ref 3.5–5.0)
Alkaline Phosphatase: 48 U/L (ref 38–126)
Anion gap: 4 — ABNORMAL LOW (ref 5–15)
BUN: 17 mg/dL (ref 8–23)
CO2: 32 mmol/L (ref 22–32)
Calcium: 9.3 mg/dL (ref 8.9–10.3)
Chloride: 104 mmol/L (ref 98–111)
Creatinine: 1.07 mg/dL (ref 0.61–1.24)
GFR, Estimated: >60 mL/min (ref >60)
Glucose, Bld: 143 mg/dL — ABNORMAL HIGH (ref 70–99)
Potassium: 4.7 mmol/L (ref 3.5–5.1)
Sodium: 140 mmol/L (ref 135–145)
Total Bilirubin: 0.9 mg/dL (ref 0.0–1.2)
Total Protein: 7.3 g/dL (ref 6.5–8.1)

## 2023-11-14 LAB — RETICULOCYTES
Immature Retic Fract: 13.3 % (ref 2.3–15.9)
RBC.: 3.77 MIL/uL — ABNORMAL LOW (ref 4.22–5.81)
Retic Count, Absolute: 49.8 10*3/uL (ref 19.0–186.0)
Retic Ct Pct: 1.3 % (ref 0.4–3.1)

## 2023-11-14 LAB — IRON AND IRON BINDING CAPACITY (CC-WL,HP ONLY)
Iron: 68 ug/dL (ref 45–182)
Saturation Ratios: 22 % (ref 17.9–39.5)
TIBC: 316 ug/dL (ref 250–450)
UIBC: 248 ug/dL (ref 117–376)

## 2023-11-14 LAB — FERRITIN: Ferritin: 149 ng/mL (ref 24–336)

## 2023-11-14 MED ORDER — LANREOTIDE ACETATE 120 MG/0.5ML ~~LOC~~ SOLN
120.0000 mg | SUBCUTANEOUS | Status: AC
Start: 2023-11-14 — End: ?

## 2023-11-14 NOTE — Progress Notes (Signed)
 Hematology and Oncology Follow Up Visit  Brian Mays [id number] 02-02-1946 78 y.o. 11/14/2023   Principle Diagnosis:  Metastatic low grade neuroendocrine tumor-hepatic metastases Iron  deficiency anemia   Past Diagnosis:  S/p yttrium-90 intrahepatic therapy - November 2016 Lutathera  (Lu 177) injection on 02/01/2018 - s/p cycle 4 SBRT to T6 lesion -- 11/06/2021   Current Therapy:        Somatuline 120 mg every 2 week-start on 08/01/2023  IV Iron  as indicated  Lutathera -retreatment -09/06/2023   Interim History:  Brian Mays is here today for follow-up. He is doing well and has started mountain biking again.  Energy is fair. He denies any pain.  He goes for his second and final Lutathera  infusion on 11/29/2023.  No fever, chills, n/v, cough, rash, dizziness, SOB, chest pain, palpitations, abdominal pain or changes in bowel or bladder habits.  No swelling or tenderness in his extremities.  No falls or syncope.  Appetite and hydration are good. Weight is stable at 187 lbs.   ECOG Performance Status: 1 - Symptomatic but completely ambulatory  Medications:  Allergies as of 11/14/2023       Reactions   Iohexol  Hives    Code: HIVES, Desc: PER [name] @ [facility]IMARY [facility], PT IS ALLERGIC TO CONTRAST DYE 10/02/08/RM  05/01/10...needs full premeds per our protocol w/ [facility]., Onset Date: [id number]   Iodinated Contrast Media Hives        Medication List        Accurate as of November 14, 2023 10:46 AM. If you have any questions, ask your nurse or doctor.          STOP taking these medications    amoxicillin  500 MG capsule Commonly known as: AMOXIL  Stopped by: Lauraine Pepper       TAKE these medications    aspirin  EC 81 MG tablet Take 81 mg by mouth every morning.   atorvastatin  80 MG tablet Commonly known as: LIPITOR TAKE 1 TABLET BY MOUTH EVERY DAY   Cholecalciferol  25 MCG (1000 UT) tablet Take 1,000 Units by mouth 2 (two) times daily.   clopidogrel  75 MG  tablet Commonly known as: PLAVIX  TAKE 1 TABLET BY MOUTH EVERY DAY   cyanocobalamin  1000 MCG tablet Commonly known as: VITAMIN B12 Take 1,000 mcg by mouth every other day.   diphenhydramine -acetaminophen  25-500 MG Tabs tablet Commonly known as: TYLENOL  PM Take 1 tablet by mouth at bedtime as needed (sleep/pain).   fluticasone  50 MCG/ACT nasal spray Commonly known as: FLONASE  Place 2 sprays into both nostrils daily as needed (sinuses).   hydrocortisone  25 MG suppository Commonly known as: ANUSOL -HC INSERT 1 SUPPOSITORY RECTALLY TWICE A DAY AS NEEDED FOR HEMORRHOIDS   ipratropium 0.03 % nasal spray Commonly known as: ATROVENT  Place 2 sprays into the nose 3 (three) times daily. Use as needed for nasal drip   lansoprazole  30 MG capsule Commonly known as: PREVACID  TAKE 1 CAPSULE (30 MG TOTAL) BY MOUTH DAILY AT 12 NOON.   levothyroxine  50 MCG tablet Commonly known as: SYNTHROID  Take 1 tablet (50 mcg total) by mouth daily before breakfast.   LORazepam  0.5 MG tablet Commonly known as: Ativan  1 tab po 30 minutes prior to radiation or MRI scans   losartan  100 MG tablet Commonly known as: COZAAR  Take 1 tablet (100 mg total) by mouth daily.   metoprolol  tartrate 25 MG tablet Commonly known as: LOPRESSOR  TAKE 1/2 TABLET TWICE A DAY BY MOUTH   nitroGLYCERIN  0.4 MG SL tablet Commonly known as: NITROSTAT  PLACE  1 TABLET UNDER THE TONGUE EVERY 5 (FIVE) MINUTES X 3 DOSES AS NEEDED FOR CHEST PAIN.   ondansetron  8 MG tablet Commonly known as: ZOFRAN  Take 1 tablet (8 mg total) by mouth 2 (two) times daily as needed for nausea or vomiting.   ranolazine  500 MG 12 hr tablet Commonly known as: RANEXA  Take 1 tablet by mouth twice daily   SOMATULINE DEPOT  [place] Inject 120 mcg into the skin every 28 (twenty-eight) days. Receives at Dr Jessy office   SOOTHE XP OP Place 1 drop into both eyes daily as needed (dry eyes).        Allergies:  Allergies  Allergen Reactions   Iohexol   Hives     Code: HIVES, Desc: PER [name] @ [facility], PT IS ALLERGIC TO CONTRAST DYE 10/02/08/RM  05/01/10...needs full premeds per our protocol w/ [facility]., Onset Date: 87977990    Iodinated Contrast Media Hives    Past Medical History, Surgical history, Social history, and Family History were reviewed and updated.  Review of Systems: All other 10 point review of systems is negative.   Physical Exam:  height is 5' 10 (1.778 m) and weight is 187 lb (84.8 kg). His oral temperature is 98.2 F (36.8 C). His blood pressure is 161/52 (abnormal) and his pulse is 60. His respiration is 18 and oxygen saturation is 100%.   Wt Readings from Last 3 Encounters:  11/14/23 187 lb (84.8 kg)  10/24/23 185 lb 3.2 oz (84 kg)  10/14/23 186 lb 1.9 oz (84.4 kg)    Ocular: Sclerae unicteric, pupils equal, round and reactive to light Ear-nose-throat: Oropharynx clear, dentition fair Lymphatic: No cervical or supraclavicular adenopathy Lungs no rales or rhonchi, good excursion bilaterally Heart regular rate and rhythm, no murmur appreciated Abd soft, nontender, positive bowel sounds MSK no focal spinal tenderness, no joint edema Neuro: non-focal, well-oriented, appropriate affect Breasts:   Lab Results  Component Value Date   WBC 5.3 11/14/2023   HGB 11.5 (L) 11/14/2023   HCT 34.9 (L) 11/14/2023   MCV 94.1 11/14/2023   PLT 140 (L) 11/14/2023   Lab Results  Component Value Date   FERRITIN 139 08/01/2023   IRON  53 08/01/2023   TIBC 297 08/01/2023   UIBC 244 08/01/2023   IRONPCTSAT 18 08/01/2023   Lab Results  Component Value Date   RETICCTPCT 1.3 11/14/2023   RBC 3.77 (L) 11/14/2023   No results found for: KPAFRELGTCHN, LAMBDASER, KAPLAMBRATIO No results found for: KIMBERLY LE, IGMSERUM No results found for: STEPHANY CARLOTA BENSON MARKEL EARLA JOANNIE DOC VICK, SPEI   Chemistry      Component Value Date/Time   NA 139 10/14/2023 1005    NA 141 06/22/2022 0942   NA 144 10/04/2017 0904   NA 141 07/23/2016 1255   K 4.6 10/14/2023 1005   K 3.8 10/04/2017 0904   K 4.1 07/23/2016 1255   CL 100 10/14/2023 1005   CL 103 10/04/2017 0904   CO2 32 10/14/2023 1005   CO2 28 10/04/2017 0904   CO2 27 07/23/2016 1255   BUN 16 10/14/2023 1005   BUN 20 06/22/2022 0942   BUN 14 10/04/2017 0904   BUN 13.8 07/23/2016 1255   CREATININE 1.00 10/14/2023 1005   CREATININE 0.8 10/04/2017 0904   CREATININE 1.0 07/23/2016 1255      Component Value Date/Time   CALCIUM  9.4 10/14/2023 1005   CALCIUM  9.4 10/04/2017 0904   CALCIUM  9.2 07/23/2016 1255   ALKPHOS 54 10/14/2023 1005  ALKPHOS 64 10/04/2017 0904   ALKPHOS 77 07/23/2016 1255   AST 20 10/14/2023 1005   AST 23 07/23/2016 1255   ALT 11 10/14/2023 1005   ALT 22 10/04/2017 0904   ALT 13 07/23/2016 1255   BILITOT 0.7 10/14/2023 1005   BILITOT 1.13 07/23/2016 1255       Impression and Plan: [name] is a very pleasant 78 yo caucasian gentleman with metastatic low-grade neuroendocrine carcinoma with hepatic metastasis. Chromagranin A pending. Last month was 173.  Iron  studies pending.  He is tolerating Lutathera  with some fatigue right after.  He goes for his second and final dose on 11/29/2023.  No Somatuline today per MD.  We will repeat his Dotatate PET scan the same week we see him for follow-up in 6 weeks.   Lauraine Pepper, NP 1/13/202510:46 AM

## 2023-11-16 LAB — CHROMOGRANIN A: Chromogranin A (ng/mL): 166.3 ng/mL — ABNORMAL HIGH (ref 0.0–101.8)

## 2023-11-20 ENCOUNTER — Encounter (INDEPENDENT_AMBULATORY_CARE_PROVIDER_SITE_OTHER): Payer: Medicare HMO | Admitting: Family Medicine

## 2023-11-20 DIAGNOSIS — J0110 Acute frontal sinusitis, unspecified: Secondary | ICD-10-CM

## 2023-11-21 DIAGNOSIS — J0110 Acute frontal sinusitis, unspecified: Secondary | ICD-10-CM | POA: Diagnosis not present

## 2023-11-21 MED ORDER — AMOXICILLIN 500 MG PO CAPS: 1000.0000 mg | ORAL_CAPSULE | Freq: Two times a day (BID) | ORAL | 0 refills | Status: DC

## 2023-11-21 NOTE — Telephone Encounter (Signed)
Please see the MyChart message reply(ies) for my assessment and plan.  The patient gave consent for this Medical Advice Message and is aware that it may result in a bill to their insurance company as well as the possibility that this may result in a co-payment or deductible. They are an established patient, but are not seeking medical advice exclusively about a problem treated during an in person or video visit in the last 7 days. I did not recommend an in person or video visit within 7 days of my reply.  I spent a total of 10 minutes cumulative time within 7 days through MyChart messaging Taiven Greenley, MD  

## 2023-11-28 MED ORDER — LUTETIUM LU 177 DOTATATE 370 MBQ/ML IV SOLN: 200.0000 | Freq: Once | INTRAVENOUS | Status: DC

## 2023-11-28 MED ORDER — OCTREOTIDE ACETATE 30 MG IM KIT
30.0000 mg | PACK | Freq: Once | INTRAMUSCULAR | Status: AC
  Administered 2023-11-29: 30 mg via INTRAMUSCULAR

## 2023-11-28 MED ORDER — SODIUM CHLORIDE 0.9 % IV SOLN
500.0000 mL | Freq: Once | INTRAVENOUS | Status: AC
  Administered 2023-11-29: 500 mL via INTRAVENOUS

## 2023-11-28 MED ORDER — SODIUM CHLORIDE 0.9 % IV SOLN
8.0000 mg | Freq: Once | INTRAVENOUS | Status: AC
  Administered 2023-11-29: 8 mg via INTRAVENOUS
  Filled 2023-11-28: qty 4

## 2023-11-28 MED ORDER — PROCHLORPERAZINE EDISYLATE 10 MG/2ML IJ SOLN: 10.0000 mg | Freq: Four times a day (QID) | INTRAMUSCULAR | Status: DC | PRN

## 2023-11-28 MED ORDER — AMINO ACID RADIOPROTECTANT - L-LYSINE 2.5%/L-ARGININE 2.5% IN NS
250.0000 mL/h | INTRAVENOUS | Status: AC
  Administered 2023-11-29: 250 mL/h via INTRAVENOUS
  Filled 2023-11-28: qty 1000

## 2023-11-28 MED ORDER — OCTREOTIDE ACETATE 500 MCG/ML IJ SOLN
500.0000 ug | Freq: Once | INTRAMUSCULAR | Status: DC | PRN
Start: 2023-11-29 — End: 2023-12-05

## 2023-11-28 NOTE — Written Directive (Cosign Needed)
LUTATHERA THERAPY   RADIOPHARMACEUTICAL:  Lutetium 177 Dotatate (Lutathera)     PRESCRIBED DOSE FOR ADMINISTRATION:  200 mCi   ROUTE OFADMINISTRATION:  IV   DIAGNOSIS:  Metastatic malignant neuroendocrine tumor to liver   REFERRING PHYSICIAN: Dr. Arlan Organ   TREATMENT #: 2   DATE OF LAST LONG LIVED SOMATOSTATIN INJECTION: 11/14/2023   ADDITIONAL PHYSICIAN COMMENTS/NOTES:    AUTHORIZED USER SIGNATURE & TIME STAMP: Patriciaann Clan, MD   11/29/23    8:56 AM

## 2023-11-29 ENCOUNTER — Ambulatory Visit (HOSPITAL_COMMUNITY)
Admission: RE | Admit: 2023-11-29 | Discharge: 2023-11-29 | Disposition: A | Discharge status: Home / Self Care | Payer: Medicare HMO | Source: Ambulatory Visit | Attending: Hematology & Oncology | Admitting: Hematology & Oncology

## 2023-11-29 DIAGNOSIS — C7B8 Other secondary neuroendocrine tumors: Secondary | ICD-10-CM | POA: Insufficient documentation

## 2023-11-29 DIAGNOSIS — C220 Liver cell carcinoma: Secondary | ICD-10-CM | POA: Diagnosis not present

## 2023-11-29 DIAGNOSIS — D649 Anemia, unspecified: Secondary | ICD-10-CM | POA: Diagnosis not present

## 2023-11-29 MED ORDER — OCTREOTIDE ACETATE 30 MG IM KIT
PACK | INTRAMUSCULAR | Status: AC
  Filled 2023-11-29: qty 1

## 2023-11-29 NOTE — Progress Notes (Signed)
CLINICAL DATA: [age] year-old [male] with metastatic neuroendocrine tumor. Well differentiated tumor with somatostatin receptor is identified within the [liver] and skeleton by DOTATATE PET CT scan.    EXAM: NUCLEAR MEDICINE LUTATHERA ADMINISTRATION  TECHNIQUE: Infusion: The nuclear medicine technologist and I personally verified the dose activity ([192] mCi) to be delivered as specified in the written directive (200 mCi), and verified the patient identification via 2 separate methods.  20 gauge IV were started in the antecubital veins. Anti-emetics were administered by nursing staff. Amino acid renal protection was initiated 30 minutes prior to Lu 177 DOTATATE (Lutathera) infusion and continued continuously for 4 hours. Lutathera infusion was administered over 30 minutes.      The total administered dose was [204] mCi Lu 177 DOTATATE.    The entire IV tubing, venocatheter, stopcock and syringes was removed in total, placed in a disposal bag and sent for assay of the residual activity, which will be reported at a later time in our EMR by the physics staff. Pressure was applied to the venipuncture sites, and a compression bandage placed. Radiation Safety personnel were present to perform the discharge survey, as detailed on their documentation.    Patient received 30 mg IM long-acting Sandostatin injection 4 hours after Lutathera effusion in the nuclear medicine department.   RADIOPHARMACEUTICALS:   [204] mCi Lu 177 DOTATATE   FINDINGS: Diagnosis: [Metastatic neuroendocrine tumor.]    Current Infusion: [2    Planned Infusions: [2]    Patient reports [minimal] interval symptoms following therapy.  Some improvement fatigue.  The patient's most recent blood counts were reviewed and remains a good candidate to proceed with Lutathera.  Stable mild  anemia (hemoglobin equal 11.5).  Platelets increased (platelets equal 140)..  Chromogranin slightly decreased (chromogranin  166).     No renal toxicity or hepatic steatosis     The patient was situated in an infusion suite and administered Lutathera as above. Patient will follow-up with referring oncologist for interval serum laboratories (CBC and CMP) in approximately 4 weeks.       Patient received 30 mg IM long-acting Sandostatin injection 4 hours after Lutathera effusion in the nuclear medicine department.     IMPRESSION: [Second and final] Lu 177 DOTATATE treatment for metastatic neuroendocrine tumor. The patient tolerated the infusion well. The patient will return to oncology care.

## 2023-11-29 NOTE — Written Directive (Cosign Needed)
LUTATHERA THERAPY   RADIOPHARMACEUTICAL:  Lutetium 177 Dotatate (Lutathera)     PRESCRIBED DOSE FOR ADMINISTRATION:  200 mCi   ROUTE OFADMINISTRATION:  IV   DIAGNOSIS:  Neuroendocrine tumor   REFERRING PHYSICIAN:ENNEVER, PETER MD   TREATMENT #: 2   DATE OF LAST LONG LIVED SOMATOSTATIN INJECTION:   ADDITIONAL PHYSICIAN COMMENTS/NOTES:   AUTHORIZED USER SIGNATURE & TIME STAMP:  Patriciaann Clan, MD     11/29/23 at 8:00 am

## 2023-11-29 NOTE — Progress Notes (Signed)
Tolerated lutathera treament  well.

## 2023-12-13 ENCOUNTER — Other Ambulatory Visit: Payer: Self-pay | Admitting: Radiation Therapy

## 2023-12-30 ENCOUNTER — Encounter (HOSPITAL_COMMUNITY)
Admission: RE | Admit: 2023-12-30 | Discharge: 2023-12-30 | Disposition: A | Discharge status: Home / Self Care | Payer: Medicare HMO | Source: Ambulatory Visit | Attending: Family | Admitting: Family

## 2023-12-30 DIAGNOSIS — C787 Secondary malignant neoplasm of liver and intrahepatic bile duct: Secondary | ICD-10-CM | POA: Diagnosis not present

## 2023-12-30 DIAGNOSIS — K769 Liver disease, unspecified: Secondary | ICD-10-CM

## 2023-12-30 DIAGNOSIS — C786 Secondary malignant neoplasm of retroperitoneum and peritoneum: Secondary | ICD-10-CM | POA: Insufficient documentation

## 2023-12-30 DIAGNOSIS — C7A8 Other malignant neuroendocrine tumors: Secondary | ICD-10-CM | POA: Insufficient documentation

## 2023-12-30 DIAGNOSIS — C7951 Secondary malignant neoplasm of bone: Secondary | ICD-10-CM | POA: Insufficient documentation

## 2023-12-30 MED ORDER — COPPER CU 64 DOTATATE 1 MCI/ML IV SOLN
4.0000 | Freq: Once | INTRAVENOUS | Status: AC
  Administered 2023-12-30: 4.06 via INTRAVENOUS

## 2024-01-02 ENCOUNTER — Inpatient Hospital Stay (HOSPITAL_BASED_OUTPATIENT_CLINIC_OR_DEPARTMENT_OTHER): Payer: Medicare HMO | Admitting: Hematology & Oncology

## 2024-01-02 ENCOUNTER — Inpatient Hospital Stay: Payer: Medicare HMO

## 2024-01-02 ENCOUNTER — Encounter: Payer: Self-pay | Admitting: Hematology & Oncology

## 2024-01-02 ENCOUNTER — Inpatient Hospital Stay: Payer: Medicare HMO | Attending: Hematology & Oncology

## 2024-01-02 ENCOUNTER — Other Ambulatory Visit: Payer: Self-pay

## 2024-01-02 VITALS — BP 146/56 | HR 62 | Temp 98.8°F | Resp 18 | Ht 70.0 in | Wt 187.0 lb

## 2024-01-02 DIAGNOSIS — C7B8 Other secondary neuroendocrine tumors: Secondary | ICD-10-CM

## 2024-01-02 DIAGNOSIS — K769 Liver disease, unspecified: Secondary | ICD-10-CM

## 2024-01-02 DIAGNOSIS — C7A8 Other malignant neuroendocrine tumors: Secondary | ICD-10-CM | POA: Diagnosis present

## 2024-01-02 DIAGNOSIS — D500 Iron deficiency anemia secondary to blood loss (chronic): Secondary | ICD-10-CM

## 2024-01-02 DIAGNOSIS — K909 Intestinal malabsorption, unspecified: Secondary | ICD-10-CM | POA: Diagnosis not present

## 2024-01-02 DIAGNOSIS — C7951 Secondary malignant neoplasm of bone: Secondary | ICD-10-CM

## 2024-01-02 DIAGNOSIS — D509 Iron deficiency anemia, unspecified: Secondary | ICD-10-CM | POA: Diagnosis not present

## 2024-01-02 LAB — CBC WITH DIFFERENTIAL (CANCER CENTER ONLY)
Abs Immature Granulocytes: 0.02 10*3/uL (ref 0.00–0.07)
Basophils Absolute: 0 10*3/uL (ref 0.0–0.1)
Basophils Relative: 1 %
Eosinophils Absolute: 0.2 10*3/uL (ref 0.0–0.5)
Eosinophils Relative: 4 %
HCT: 32.8 % — ABNORMAL LOW (ref 39.0–52.0)
Hemoglobin: 10.7 g/dL — ABNORMAL LOW (ref 13.0–17.0)
Immature Granulocytes: 0 %
Lymphocytes Relative: 9 %
Lymphs Abs: 0.5 10*3/uL — ABNORMAL LOW (ref 0.7–4.0)
MCH: 31.1 pg (ref 26.0–34.0)
MCHC: 32.6 g/dL (ref 30.0–36.0)
MCV: 95.3 fL (ref 80.0–100.0)
Monocytes Absolute: 0.3 10*3/uL (ref 0.1–1.0)
Monocytes Relative: 5 %
Neutro Abs: 4.4 10*3/uL (ref 1.7–7.7)
Neutrophils Relative %: 81 %
Platelet Count: 132 10*3/uL — ABNORMAL LOW (ref 150–400)
RBC: 3.44 MIL/uL — ABNORMAL LOW (ref 4.22–5.81)
RDW: 15.3 % (ref 11.5–15.5)
WBC Count: 5.4 10*3/uL (ref 4.0–10.5)
nRBC: 0 % (ref 0.0–0.2)

## 2024-01-02 LAB — CMP (CANCER CENTER ONLY)
ALT: 13 U/L (ref 0–44)
AST: 21 U/L (ref 15–41)
Albumin: 4 g/dL (ref 3.5–5.0)
Alkaline Phosphatase: 49 U/L (ref 38–126)
Anion gap: 7 (ref 5–15)
BUN: 18 mg/dL (ref 8–23)
CO2: 29 mmol/L (ref 22–32)
Calcium: 9.6 mg/dL (ref 8.9–10.3)
Chloride: 103 mmol/L (ref 98–111)
Creatinine: 1.14 mg/dL (ref 0.61–1.24)
GFR, Estimated: >60 mL/min (ref >60)
Glucose, Bld: 172 mg/dL — ABNORMAL HIGH (ref 70–99)
Potassium: 4.3 mmol/L (ref 3.5–5.1)
Sodium: 139 mmol/L (ref 135–145)
Total Bilirubin: 0.9 mg/dL (ref 0.0–1.2)
Total Protein: 7.3 g/dL (ref 6.5–8.1)

## 2024-01-02 LAB — IRON AND IRON BINDING CAPACITY (CC-WL,HP ONLY)
Iron: 67 ug/dL (ref 45–182)
Saturation Ratios: 23 % (ref 17.9–39.5)
TIBC: 295 ug/dL (ref 250–450)
UIBC: 228 ug/dL (ref 117–376)

## 2024-01-02 LAB — RETICULOCYTES
Immature Retic Fract: 12.7 % (ref 2.3–15.9)
RBC.: 3.45 MIL/uL — ABNORMAL LOW (ref 4.22–5.81)
Retic Count, Absolute: 38 10*3/uL (ref 19.0–186.0)
Retic Ct Pct: 1.1 % (ref 0.4–3.1)

## 2024-01-02 LAB — FERRITIN: Ferritin: 163 ng/mL (ref 24–336)

## 2024-01-02 MED ORDER — LANREOTIDE ACETATE 120 MG/0.5ML ~~LOC~~ SOLN
120.0000 mg | Freq: Once | SUBCUTANEOUS | Status: AC
  Administered 2024-01-02: 120 mg via SUBCUTANEOUS
  Filled 2024-01-02: qty 120

## 2024-01-02 NOTE — Progress Notes (Signed)
 Hematology and Oncology Follow Up Visit  Brian Mays [id number] 28-Jun-1946 78 y.o. 01/02/2024   Principle Diagnosis:  Metastatic low grade neuroendocrine tumor-hepatic metastases Iron deficiency anemia   Past Diagnosis:  S/p yttrium-90 intrahepatic therapy - November 2016 Lutathera (Lu 177) injection on 02/01/2018 - s/p cycle 4 SBRT to T6 lesion -- 11/06/2021   Current Therapy:        Somatuline 120 mg every 2 week-start on 08/01/2023  IV Iron as indicated  --Venofer given on 06/27/2023 Lutathera-retreatment -09/06/2023 -- s/p 2/2 cycles  Interim History:  Brian Mays is here today for follow-up.  He completed all of his Lutathera.  He had 2 additional treatments.  Again this was completed on 11/29/2023.  He did have a PET scan that was done.  This was done on 220 06/20/2024.  The PET scan showed everything to be stable.  Nothing new was found.  There is no evidence of disease progression.  His last Chromogranin A level was 166.  He has had no problems with bowels or bladder.  He has had no bony pain.  He has had no cough or shortness of breath.  He has had no leg swelling.  He has had no rashes.  There has been no fever.  His appetite has been good with no nausea or vomiting.  His last iron studies done back in January showed a ferritin 149 with iron saturation of 22%.  Overall, I would say his performance status is probably ECOG 1.    Medications:  Allergies as of 01/02/2024       Reactions   Iohexol Hives    Code: HIVES, Desc: PER [name] @ [facility]IMARY [facility], PT IS ALLERGIC TO CONTRAST DYE 10/02/08/RM  05/01/10...needs full premeds per our protocol w/ [facility]., Onset Date: [id number]   Iodinated Contrast Media Hives        Medication List        Accurate as of January 02, 2024 10:54 AM. If you have any questions, ask your nurse or doctor.          Abrysvo 120 MCG/0.5ML injection Generic drug: RSV bivalent vaccine Inject 0.5 mLs into the muscle once.   amoxicillin 500 MG  capsule Commonly known as: AMOXIL Take 2 capsules (1,000 mg total) by mouth 2 (two) times daily.   aspirin EC 81 MG tablet Take 81 mg by mouth every morning.   atorvastatin 80 MG tablet Commonly known as: LIPITOR TAKE 1 TABLET BY MOUTH EVERY DAY   Cholecalciferol 25 MCG (1000 UT) tablet Take 1,000 Units by mouth 2 (two) times daily.   clopidogrel 75 MG tablet Commonly known as: PLAVIX TAKE 1 TABLET BY MOUTH EVERY DAY   cyanocobalamin 1000 MCG tablet Commonly known as: VITAMIN B12 Take 1,000 mcg by mouth every other day.   diphenhydramine-acetaminophen 25-500 MG Tabs tablet Commonly known as: TYLENOL PM Take 1 tablet by mouth at bedtime as needed (sleep/pain).   fluticasone 50 MCG/ACT nasal spray Commonly known as: FLONASE Place 2 sprays into both nostrils daily as needed (sinuses).   hydrocortisone 25 MG suppository Commonly known as: ANUSOL-HC INSERT 1 SUPPOSITORY RECTALLY TWICE A DAY AS NEEDED FOR HEMORRHOIDS   ipratropium 0.03 % nasal spray Commonly known as: ATROVENT Place 2 sprays into the nose 3 (three) times daily. Use as needed for nasal drip   lansoprazole 30 MG capsule Commonly known as: PREVACID TAKE 1 CAPSULE (30 MG TOTAL) BY MOUTH DAILY AT 12 NOON.   levothyroxine 50 MCG tablet Commonly  known as: SYNTHROID Take 1 tablet (50 mcg total) by mouth daily before breakfast.   LORazepam 0.5 MG tablet Commonly known as: Ativan 1 tab po 30 minutes prior to radiation or MRI scans   losartan 100 MG tablet Commonly known as: COZAAR Take 1 tablet (100 mg total) by mouth daily.   metoprolol tartrate 25 MG tablet Commonly known as: LOPRESSOR TAKE 1/2 TABLET TWICE A DAY BY MOUTH   nitroGLYCERIN 0.4 MG SL tablet Commonly known as: NITROSTAT PLACE 1 TABLET UNDER THE TONGUE EVERY 5 (FIVE) MINUTES X 3 DOSES AS NEEDED FOR CHEST PAIN.   ondansetron 8 MG tablet Commonly known as: ZOFRAN Take 1 tablet (8 mg total) by mouth 2 (two) times daily as needed for nausea  or vomiting.   ranolazine 500 MG 12 hr tablet Commonly known as: RANEXA Take 1 tablet by mouth twice daily   SOMATULINE DEPOT [place] Inject 120 mcg into the skin every 28 (twenty-eight) days. Receives at Dr Gustavo Lah office   SOOTHE XP OP Place 1 drop into both eyes daily as needed (dry eyes).        Allergies:  Allergies  Allergen Reactions   Iohexol Hives     Code: HIVES, Desc: PER [name] @ [facility]IMARY [facility]ARE, PT IS ALLERGIC TO CONTRAST DYE 10/02/08/RM  05/01/10...needs full premeds per our protocol w/ [facility]., Onset Date: [id number]    Iodinated Contrast Media Hives    Past Medical History, Surgical history, Social history, and Family History were reviewed and updated.  Review of Systems: Review of Systems  Constitutional: Negative.   HENT: Negative.    Eyes: Negative.   Respiratory: Negative.    Cardiovascular: Negative.   Gastrointestinal: Negative.   Genitourinary: Negative.   Musculoskeletal: Negative.   Skin: Negative.   Neurological: Negative.   Endo/Heme/Allergies: Negative.   Psychiatric/Behavioral: Negative.       Physical Exam:  height is 5\' 10"  (1.778 m) and weight is 187 lb (84.8 kg). His oral temperature is 98.8 F (37.1 C). His blood pressure is 146/56 (abnormal) and his pulse is 62. His respiration is 18 and oxygen saturation is 100%.   Wt Readings from Last 3 Encounters:  01/02/24 187 lb (84.8 kg)  11/14/23 187 lb (84.8 kg)  10/24/23 185 lb 3.2 oz (84 kg)    Physical Exam Vitals reviewed.  HENT:     Head: Normocephalic and atraumatic.  Eyes:     Pupils: Pupils are equal, round, and reactive to light.  Cardiovascular:     Rate and Rhythm: Normal rate and regular rhythm.     Heart sounds: Normal heart sounds.  Pulmonary:     Effort: Pulmonary effort is normal.     Breath sounds: Normal breath sounds.  Abdominal:     General: Bowel sounds are normal.     Palpations: Abdomen is soft.  Musculoskeletal:        General: No tenderness or deformity.  Normal range of motion.     Cervical back: Normal range of motion.  Lymphadenopathy:     Cervical: No cervical adenopathy.  Skin:    General: Skin is warm and dry.     Findings: No erythema or rash.  Neurological:     Mental Status: He is alert and oriented to person, place, and time.  Psychiatric:        Behavior: Behavior normal.        Thought Content: Thought content normal.        Judgment: Judgment normal.  Lab Results  Component Value Date   WBC 5.4 01/02/2024   HGB 10.7 (L) 01/02/2024   HCT 32.8 (L) 01/02/2024   MCV 95.3 01/02/2024   PLT 132 (L) 01/02/2024   Lab Results  Component Value Date   FERRITIN 149 11/14/2023   IRON 68 11/14/2023   TIBC 316 11/14/2023   UIBC 248 11/14/2023   IRONPCTSAT 22 11/14/2023   Lab Results  Component Value Date   RETICCTPCT 1.1 01/02/2024   RBC 3.44 (L) 01/02/2024   RBC 3.45 (L) 01/02/2024   No results found for: "KPAFRELGTCHN", "LAMBDASER", "KAPLAMBRATIO" No results found for: "IGGSERUM", "IGA", "IGMSERUM" No results found for: "TOTALPROTELP", "ALBUMINELP", "A1GS", "A2GS", "BETS", "BETA2SER", "GAMS", "MSPIKE", "SPEI"   Chemistry      Component Value Date/Time   NA 139 01/02/2024 0923   NA 141 06/22/2022 0942   NA 144 10/04/2017 0904   NA 141 07/23/2016 1255   K 4.3 01/02/2024 0923   K 3.8 10/04/2017 0904   K 4.1 07/23/2016 1255   CL 103 01/02/2024 0923   CL 103 10/04/2017 0904   CO2 29 01/02/2024 0923   CO2 28 10/04/2017 0904   CO2 27 07/23/2016 1255   BUN 18 01/02/2024 0923   BUN 20 06/22/2022 0942   BUN 14 10/04/2017 0904   BUN 13.8 07/23/2016 1255   CREATININE 1.14 01/02/2024 0923   CREATININE 0.8 10/04/2017 0904   CREATININE 1.0 07/23/2016 1255      Component Value Date/Time   CALCIUM 9.6 01/02/2024 0923   CALCIUM 9.4 10/04/2017 0904   CALCIUM 9.2 07/23/2016 1255   ALKPHOS 49 01/02/2024 0923   ALKPHOS 64 10/04/2017 0904   ALKPHOS 77 07/23/2016 1255   AST 21 01/02/2024 0923   AST 23 07/23/2016  1255   ALT 13 01/02/2024 0923   ALT 22 10/04/2017 0904   ALT 13 07/23/2016 1255   BILITOT 0.9 01/02/2024 0923   BILITOT 1.13 07/23/2016 1255       Impression and Plan: Mr. Abshier is a very pleasant 78 yo caucasian gentleman with metastatic low-grade neuroendocrine carcinoma with hepatic metastasis and bone metastasis.   He completed the Lutathera protocol.  We now have him on Somatuline.  Hopefully, we will see that his Chromogranin A is coming down.  His hemoglobin is on the lower side.  We will have to see what his iron levels look like.  We will plan to get him back to see [place] in another month.     Josph Macho, MD 3/3/202510:54 AM

## 2024-01-02 NOTE — Patient Instructions (Signed)
 Lanreotide Injection What is this medication? LANREOTIDE (lan REE oh tide) treats high levels of growth hormone (acromegaly). It is used when other therapies have not worked well enough or cannot be tolerated. It works by reducing the amount of growth hormone your body makes. This reduces symptoms and the risk of health problems caused by too much growth hormone, such as diabetes and heart disease. It may also be used to treat neuroendocrine tumors, a cancer of the cells that release hormones and other substances in your body. It works by slowing down the release of these substances from the cells. This slows tumor growth. It also decreases the symptoms of carcinoid syndrome, such as flushing or diarrhea. This medicine may be used for other purposes; ask your health care provider or pharmacist if you have questions. COMMON BRAND NAME(S): Somatuline [place] What should I tell my care team before I take this medication? They need to know if you have any of these conditions: Diabetes Gallbladder disease Heart disease Kidney disease Liver disease Thyroid disease An unusual or allergic reaction to lanreotide, other medications, foods, dyes, or preservatives Pregnant or trying to get pregnant Breast-feeding How should I use this medication? This medication is injected under the skin. It is given by your care team in a hospital or clinic setting. Talk to your care team about the use of this medication in children. Special care may be needed. Overdosage: If you think you have taken too much of this medicine contact a poison control center or emergency room at once. NOTE: This medicine is only for you. Do not share this medicine with others. What if I miss a dose? Keep appointments for follow-up doses. It is important not to miss your dose. Call your care team if you are unable to keep an appointment. What may interact with this medication? Bromocriptine Cyclosporine Certain medications for blood  pressure, heart disease, irregular heartbeat Certain medications for diabetes Quinidine Terfenadine This list may not describe all possible interactions. Give your health care provider a list of all the medicines, herbs, non-prescription drugs, or dietary supplements you use. Also tell them if you smoke, drink alcohol, or use illegal drugs. Some items may interact with your medicine. What should I watch for while using this medication? Visit your care team for regular checks on your progress. Tell your care team if your symptoms do not start to get better or if they get worse. Your condition will be monitored carefully while you are receiving this medication. You may need blood work while you are taking this medication. This medication may increase blood sugar. The risk may be higher in patients who already have diabetes. Ask your care team what you can do to lower your risk of diabetes while taking this medication. Talk to your care team if you wish to become pregnant or think you may be pregnant. This medication can cause serious birth defects. Do not breast-feed while taking this medication and for 6 months after stopping therapy. This medication may cause infertility. Talk to your care team if you are concerned about your fertility. What side effects may I notice from receiving this medication? Side effects that you should report to your care team as soon as possible: Allergic reactions--skin rash, itching, hives, swelling of the face, lips, tongue, or throat Gallbladder problems--severe stomach pain, nausea, vomiting, fever High blood sugar (hyperglycemia)--increased thirst or amount of urine, unusual weakness or fatigue, blurry vision Increase in blood pressure Low blood sugar (hypoglycemia)--tremors or shaking, anxiety, sweating, cold  or clammy skin, confusion, dizziness, rapid heartbeat Low thyroid levels (hypothyroidism)--unusual weakness or fatigue, increased sensitivity to cold,  constipation, hair loss, dry skin, weight gain, feelings of depression Slow heartbeat--dizziness, feeling faint or lightheaded, confusion, trouble breathing, unusual weakness or fatigue Side effects that usually do not require medical attention (report to your care team if they continue or are bothersome): Diarrhea Dizziness Headache Muscle spasms Nausea Pain, redness, irritation, or bruising at the injection site Stomach pain This list may not describe all possible side effects. Call your doctor for medical advice about side effects. You may report side effects to FDA at 1-800-FDA-1088. Where should I keep my medication? This medication is given in a hospital or clinic. It will not be stored at home. NOTE: This sheet is a summary. It may not cover all possible information. If you have questions about this medicine, talk to your doctor, pharmacist, or health care provider.  2024 Elsevier/Gold Standard (2022-03-10 00:00:00)

## 2024-01-04 LAB — CHROMOGRANIN A: Chromogranin A (ng/mL): 174.4 ng/mL — ABNORMAL HIGH (ref 0.0–101.8)

## 2024-01-07 ENCOUNTER — Other Ambulatory Visit: Payer: Self-pay | Admitting: Cardiovascular Disease

## 2024-01-07 ENCOUNTER — Other Ambulatory Visit: Payer: Self-pay | Admitting: Family Medicine

## 2024-01-09 ENCOUNTER — Other Ambulatory Visit: Payer: Self-pay | Admitting: Family Medicine

## 2024-01-09 DIAGNOSIS — E039 Hypothyroidism, unspecified: Secondary | ICD-10-CM

## 2024-01-11 ENCOUNTER — Telehealth: Payer: Self-pay | Admitting: Cardiovascular Disease

## 2024-01-11 NOTE — Telephone Encounter (Signed)
 Pt's medication was sent to pt's requested pharmacy on 01/09/24. Confirmation received.

## 2024-01-11 NOTE — Telephone Encounter (Signed)
*  STAT* If patient is at the pharmacy, call can be transferred to refill team.   1. Which medications need to be refilled? (please list name of each medication and dose if known) ranolazine (RANEXA) 500 MG 12 hr tablet    2. Would you like to learn more about the convenience, safety, & potential cost savings by using the [facility]? No     3. Are you open to using the [facility] (Type [facility]. No ).   4. Which pharmacy/location (including street and city if local pharmacy) is medication to be sent to? [facility] [address] [place] - [address]    5. Do they need a 30 day or 90 day supply? 90

## 2024-01-17 ENCOUNTER — Encounter: Payer: Self-pay | Admitting: Cardiovascular Disease

## 2024-01-30 ENCOUNTER — Inpatient Hospital Stay

## 2024-01-30 ENCOUNTER — Encounter: Payer: Self-pay | Admitting: Hematology & Oncology

## 2024-01-30 ENCOUNTER — Other Ambulatory Visit: Payer: Self-pay

## 2024-01-30 ENCOUNTER — Inpatient Hospital Stay: Admitting: Hematology & Oncology

## 2024-01-30 VITALS — BP 148/50 | HR 59 | Temp 99.0°F | Resp 16 | Ht 70.0 in | Wt 187.0 lb

## 2024-01-30 DIAGNOSIS — C7A00 Malignant carcinoid tumor of unspecified site: Secondary | ICD-10-CM

## 2024-01-30 DIAGNOSIS — D500 Iron deficiency anemia secondary to blood loss (chronic): Secondary | ICD-10-CM

## 2024-01-30 DIAGNOSIS — C7B03 Secondary carcinoid tumors of bone: Secondary | ICD-10-CM

## 2024-01-30 DIAGNOSIS — C7B8 Other secondary neuroendocrine tumors: Secondary | ICD-10-CM | POA: Diagnosis not present

## 2024-01-30 DIAGNOSIS — K909 Intestinal malabsorption, unspecified: Secondary | ICD-10-CM

## 2024-01-30 DIAGNOSIS — C7A8 Other malignant neuroendocrine tumors: Secondary | ICD-10-CM | POA: Diagnosis not present

## 2024-01-30 DIAGNOSIS — C7951 Secondary malignant neoplasm of bone: Secondary | ICD-10-CM

## 2024-01-30 LAB — CMP (CANCER CENTER ONLY)
ALT: 11 U/L (ref 0–44)
AST: 19 U/L (ref 15–41)
Albumin: 3.8 g/dL (ref 3.5–5.0)
Alkaline Phosphatase: 47 U/L (ref 38–126)
Anion gap: 5 (ref 5–15)
BUN: 19 mg/dL (ref 8–23)
CO2: 31 mmol/L (ref 22–32)
Calcium: 9.2 mg/dL (ref 8.9–10.3)
Chloride: 105 mmol/L (ref 98–111)
Creatinine: 0.99 mg/dL (ref 0.61–1.24)
GFR, Estimated: >60 mL/min (ref >60)
Glucose, Bld: 91 mg/dL (ref 70–99)
Potassium: 4.1 mmol/L (ref 3.5–5.1)
Sodium: 141 mmol/L (ref 135–145)
Total Bilirubin: 0.7 mg/dL (ref 0.0–1.2)
Total Protein: 7.1 g/dL (ref 6.5–8.1)

## 2024-01-30 LAB — CBC WITH DIFFERENTIAL (CANCER CENTER ONLY)
Abs Immature Granulocytes: 0.02 10*3/uL (ref 0.00–0.07)
Basophils Absolute: 0 10*3/uL (ref 0.0–0.1)
Basophils Relative: 1 %
Eosinophils Absolute: 0.2 10*3/uL (ref 0.0–0.5)
Eosinophils Relative: 4 %
HCT: 31.3 % — ABNORMAL LOW (ref 39.0–52.0)
Hemoglobin: 10.1 g/dL — ABNORMAL LOW (ref 13.0–17.0)
Immature Granulocytes: 0 %
Lymphocytes Relative: 8 %
Lymphs Abs: 0.4 10*3/uL — ABNORMAL LOW (ref 0.7–4.0)
MCH: 31.6 pg (ref 26.0–34.0)
MCHC: 32.3 g/dL (ref 30.0–36.0)
MCV: 97.8 fL (ref 80.0–100.0)
Monocytes Absolute: 0.5 10*3/uL (ref 0.1–1.0)
Monocytes Relative: 9 %
Neutro Abs: 4.1 10*3/uL (ref 1.7–7.7)
Neutrophils Relative %: 78 %
Platelet Count: 129 10*3/uL — ABNORMAL LOW (ref 150–400)
RBC: 3.2 MIL/uL — ABNORMAL LOW (ref 4.22–5.81)
RDW: 14.6 % (ref 11.5–15.5)
WBC Count: 5.3 10*3/uL (ref 4.0–10.5)
nRBC: 0 % (ref 0.0–0.2)

## 2024-01-30 LAB — FERRITIN: Ferritin: 123 ng/mL (ref 24–336)

## 2024-01-30 LAB — IRON AND IRON BINDING CAPACITY (CC-WL,HP ONLY)
Iron: 62 ug/dL (ref 45–182)
Saturation Ratios: 21 % (ref 17.9–39.5)
TIBC: 290 ug/dL (ref 250–450)
UIBC: 228 ug/dL (ref 117–376)

## 2024-01-30 LAB — LACTATE DEHYDROGENASE: LDH: 185 U/L (ref 98–192)

## 2024-01-30 MED ORDER — LANREOTIDE ACETATE 120 MG/0.5ML ~~LOC~~ SOLN
120.0000 mg | SUBCUTANEOUS | Status: DC
  Administered 2024-01-30: 120 mg via SUBCUTANEOUS
  Filled 2024-01-30: qty 120

## 2024-01-30 NOTE — Progress Notes (Signed)
 Hematology and Oncology Follow Up Visit  Brian Mays [id number] Apr 06, 1946 78 y.o. 01/30/2024   Principle Diagnosis:  Metastatic low grade neuroendocrine tumor-hepatic metastases Iron deficiency anemia   Past Diagnosis:  S/p [id number] intrahepatic therapy - November 2016 Lutathera (Lu 177) injection on 02/01/2018 - s/p cycle 4 SBRT to T6 lesion -- 11/06/2021   Current Therapy:        Somatuline 120 mg q 4 week-start on 08/01/2023  IV Iron as indicated  --Venofer given on 06/27/2023 Lutathera-retreatment -09/06/2023 -- s/p 2/2 cycles  Interim History:  Brian Mays is here today for follow-up.  He completed all of his Lutathera.  He had 2 additional treatments.  Again this was completed on 11/29/2023.  He did have a PET scan that was done.  This was done on 220 06/20/2024.  The PET scan showed everything to be stable.  Nothing new was found.  There is no evidence of disease progression.  His last Chromogranin A level was 174.  He has had no problems with bowels or bladder.  He has had no bony pain.  He has had no cough or shortness of breath.  He has had no leg swelling.  He has had no rashes.  There has been no fever.  His appetite has been good with no nausea or vomiting.  His last iron studies done back in January showed a ferritin 163 with iron saturation of 23%.  Overall, I would say his performance status is probably ECOG 1.    Medications:  Allergies as of 01/30/2024       Reactions   Iohexol Hives    Code: HIVES, Desc: PER [name] @ [facility]IMARY [facility], PT IS ALLERGIC TO CONTRAST DYE 10/02/08/RM  05/01/10...needs full premeds per our protocol w/ [facility]., Onset Date: [id number]   Iodinated Contrast Media Hives        Medication List        Accurate as of January 30, 2024 10:48 AM. If you have any questions, ask your nurse or doctor.          STOP taking these medications    amoxicillin 500 MG capsule Commonly known as: AMOXIL Stopped by: Josph Macho       TAKE  these medications    Abrysvo 120 MCG/0.5ML injection Generic drug: RSV bivalent vaccine Inject 0.5 mLs into the muscle once.   aspirin EC 81 MG tablet Take 81 mg by mouth every morning.   atorvastatin 80 MG tablet Commonly known as: LIPITOR TAKE 1 TABLET BY MOUTH EVERY DAY   Cholecalciferol 25 MCG (1000 UT) tablet Take 1,000 Units by mouth 2 (two) times daily.   clopidogrel 75 MG tablet Commonly known as: PLAVIX TAKE 1 TABLET BY MOUTH EVERY DAY   cyanocobalamin 1000 MCG tablet Commonly known as: VITAMIN B12 Take 1,000 mcg by mouth every other day.   diphenhydramine-acetaminophen 25-500 MG Tabs tablet Commonly known as: TYLENOL PM Take 1 tablet by mouth at bedtime as needed (sleep/pain).   fluticasone 50 MCG/ACT nasal spray Commonly known as: FLONASE Place 2 sprays into both nostrils daily as needed (sinuses).   hydrocortisone 25 MG suppository Commonly known as: ANUSOL-HC INSERT 1 SUPPOSITORY RECTALLY TWICE A DAY AS NEEDED FOR HEMORRHOIDS   ipratropium 0.03 % nasal spray Commonly known as: ATROVENT Place 2 sprays into the nose 3 (three) times daily. Use as needed for nasal drip   lansoprazole 30 MG capsule Commonly known as: PREVACID TAKE 1 CAPSULE (30 MG TOTAL) BY MOUTH DAILY  AT 12 NOON.   levothyroxine 50 MCG tablet Commonly known as: SYNTHROID TAKE 1 TABLET BY MOUTH DAILY BEFORE BREAKFAST   LORazepam 0.5 MG tablet Commonly known as: Ativan 1 tab po 30 minutes prior to radiation or MRI scans   losartan 100 MG tablet Commonly known as: COZAAR Take 1 tablet (100 mg total) by mouth daily.   metoprolol tartrate 25 MG tablet Commonly known as: LOPRESSOR TAKE 1/2 TABLET TWICE A DAY BY MOUTH   nitroGLYCERIN 0.4 MG SL tablet Commonly known as: NITROSTAT PLACE 1 TABLET UNDER THE TONGUE EVERY 5 (FIVE) MINUTES X 3 DOSES AS NEEDED FOR CHEST PAIN.   ondansetron 8 MG tablet Commonly known as: ZOFRAN Take 1 tablet (8 mg total) by mouth 2 (two) times daily as  needed for nausea or vomiting.   ranolazine 500 MG 12 hr tablet Commonly known as: RANEXA Take 1 tablet by mouth twice daily   SOMATULINE DEPOT [place] Inject 120 mcg into the skin every 28 (twenty-eight) days. Receives at Dr Gustavo Lah office   SOOTHE XP OP Place 1 drop into both eyes daily as needed (dry eyes).        Allergies:  Allergies  Allergen Reactions   Iohexol Hives     Code: HIVES, Desc: PER [name] @ [facility], PT IS ALLERGIC TO CONTRAST DYE 10/02/08/RM  05/01/10...needs full premeds per our protocol w/ [facility]., Onset Date: [id number]    Iodinated Contrast Media Hives    Past Medical History, Surgical history, Social history, and Family History were reviewed and updated.  Review of Systems: Review of Systems  Constitutional: Negative.   HENT: Negative.    Eyes: Negative.   Respiratory: Negative.    Cardiovascular: Negative.   Gastrointestinal: Negative.   Genitourinary: Negative.   Musculoskeletal: Negative.   Skin: Negative.   Neurological: Negative.   Endo/Heme/Allergies: Negative.   Psychiatric/Behavioral: Negative.       Physical Exam:  height is 5\' 10"  (1.778 m) and weight is 187 lb (84.8 kg). His oral temperature is 99 F (37.2 C). His blood pressure is 148/50 (abnormal) and his pulse is 59 (abnormal). His respiration is 16 and oxygen saturation is 100%.   Wt Readings from Last 3 Encounters:  01/30/24 187 lb (84.8 kg)  01/02/24 187 lb (84.8 kg)  11/14/23 187 lb (84.8 kg)    Physical Exam Vitals reviewed.  HENT:     Head: Normocephalic and atraumatic.  Eyes:     Pupils: Pupils are equal, round, and reactive to light.  Cardiovascular:     Rate and Rhythm: Normal rate and regular rhythm.     Heart sounds: Normal heart sounds.  Pulmonary:     Effort: Pulmonary effort is normal.     Breath sounds: Normal breath sounds.  Abdominal:     General: Bowel sounds are normal.     Palpations: Abdomen is soft.  Musculoskeletal:        General: No  tenderness or deformity. Normal range of motion.     Cervical back: Normal range of motion.  Lymphadenopathy:     Cervical: No cervical adenopathy.  Skin:    General: Skin is warm and dry.     Findings: No erythema or rash.  Neurological:     Mental Status: He is alert and oriented to person, place, and time.  Psychiatric:        Behavior: Behavior normal.        Thought Content: Thought content normal.  Judgment: Judgment normal.      Lab Results  Component Value Date   WBC 5.3 01/30/2024   HGB 10.1 (L) 01/30/2024   HCT 31.3 (L) 01/30/2024   MCV 97.8 01/30/2024   PLT 129 (L) 01/30/2024   Lab Results  Component Value Date   FERRITIN 163 01/02/2024   IRON 67 01/02/2024   TIBC 295 01/02/2024   UIBC 228 01/02/2024   IRONPCTSAT 23 01/02/2024   Lab Results  Component Value Date   RETICCTPCT 1.1 01/02/2024   RBC 3.20 (L) 01/30/2024   No results found for: "KPAFRELGTCHN", "LAMBDASER", "KAPLAMBRATIO" No results found for: "IGGSERUM", "IGA", "IGMSERUM" No results found for: "TOTALPROTELP", "ALBUMINELP", "A1GS", "A2GS", "BETS", "BETA2SER", "GAMS", "MSPIKE", "SPEI"   Chemistry      Component Value Date/Time   NA 141 01/30/2024 0942   NA 141 06/22/2022 0942   NA 144 10/04/2017 0904   NA 141 07/23/2016 1255   K 4.1 01/30/2024 0942   K 3.8 10/04/2017 0904   K 4.1 07/23/2016 1255   CL 105 01/30/2024 0942   CL 103 10/04/2017 0904   CO2 31 01/30/2024 0942   CO2 28 10/04/2017 0904   CO2 27 07/23/2016 1255   BUN 19 01/30/2024 0942   BUN 20 06/22/2022 0942   BUN 14 10/04/2017 0904   BUN 13.8 07/23/2016 1255   CREATININE 0.99 01/30/2024 0942   CREATININE 0.8 10/04/2017 0904   CREATININE 1.0 07/23/2016 1255      Component Value Date/Time   CALCIUM 9.2 01/30/2024 0942   CALCIUM 9.4 10/04/2017 0904   CALCIUM 9.2 07/23/2016 1255   ALKPHOS 47 01/30/2024 0942   ALKPHOS 64 10/04/2017 0904   ALKPHOS 77 07/23/2016 1255   AST 19 01/30/2024 0942   AST 23 07/23/2016 1255    ALT 11 01/30/2024 0942   ALT 22 10/04/2017 0904   ALT 13 07/23/2016 1255   BILITOT 0.7 01/30/2024 0942   BILITOT 1.13 07/23/2016 1255       Impression and Plan: Brian Mays is a very pleasant 78 yo caucasian gentleman with metastatic low-grade neuroendocrine carcinoma with hepatic metastasis and bone metastasis.   He completed the Lutathera protocol.  We now have him on Somatuline.  Hemoglobin worried about the hemoglobin.  This is slowly dropping.  We will have to see what his iron levels look like.  I suppose this could be from the Lutathera that he had.  For right now, he is asymptomatic.  We will go ahead and plan for follow-up in 1 month.  His last PET scan was back in February.  I think were probably okay to do another 1 probably after Memorial Day    Josph Macho, MD 3/31/202510:48 AM

## 2024-01-30 NOTE — Patient Instructions (Signed)
 [facility] - A DEPT OF [facility]  Discharge Instructions: Thank you for choosing [facility] to provide your oncology and hematology care.   If you have a lab appointment with the Cancer Center, please go directly to the Cancer Center and check in at the registration area.  Wear comfortable clothing and clothing appropriate for easy access to any Portacath or PICC line.   We strive to give you quality time with your provider. You may need to reschedule your appointment if you arrive late (15 or more minutes).  Arriving late affects you and other patients whose appointments are after yours.  Also, if you miss three or more appointments without notifying the office, you may be dismissed from the clinic at the provider's discretion.      For prescription refill requests, have your pharmacy contact our office and allow 72 hours for refills to be completed.    Today you received the following chemotherapy and/or immunotherapy agents Somatuline      To help prevent nausea and vomiting after your treatment, we encourage you to take your nausea medication as directed.  BELOW ARE SYMPTOMS THAT SHOULD BE REPORTED IMMEDIATELY: *FEVER GREATER THAN 100.4 F (38 C) OR HIGHER *CHILLS OR SWEATING *NAUSEA AND VOMITING THAT IS NOT CONTROLLED WITH YOUR NAUSEA MEDICATION *UNUSUAL SHORTNESS OF BREATH *UNUSUAL BRUISING OR BLEEDING *URINARY PROBLEMS (pain or burning when urinating, or frequent urination) *BOWEL PROBLEMS (unusual diarrhea, constipation, pain near the anus) TENDERNESS IN MOUTH AND THROAT WITH OR WITHOUT PRESENCE OF ULCERS (sore throat, sores in mouth, or a toothache) UNUSUAL RASH, SWELLING OR PAIN  UNUSUAL VAGINAL DISCHARGE OR ITCHING   Items with * indicate a potential emergency and should be followed up as soon as possible or go to the Emergency Department if any problems should occur.  Please show the CHEMOTHERAPY ALERT CARD or IMMUNOTHERAPY  ALERT CARD at check-in to the Emergency Department and triage nurse. Should you have questions after your visit or need to cancel or reschedule your appointment, please contact [facility] - A DEPT OF [facility]  [phone] and follow the prompts.  Office hours are 8:00 a.m. to 4:30 p.m. Monday - Friday. Please note that voicemails left after 4:00 p.m. may not be returned until the following business day.  We are closed weekends and major holidays. You have access to a nurse at all times for urgent questions. Please call the main number to the clinic [phone] and follow the prompts.  For any non-urgent questions, you may also contact your provider using MyChart. We now offer e-Visits for anyone 30 and older to request care online for non-urgent symptoms. For details visit [email or web address].   Also download the MyChart app! Go to the app store, search "MyChart", open the app, select [place], and log in with your MyChart username and password.

## 2024-02-01 LAB — CHROMOGRANIN A: Chromogranin A (ng/mL): 151.9 ng/mL — ABNORMAL HIGH (ref 0.0–101.8)

## 2024-02-15 ENCOUNTER — Other Ambulatory Visit: Payer: Self-pay | Admitting: Family Medicine

## 2024-02-15 DIAGNOSIS — J3489 Other specified disorders of nose and nasal sinuses: Secondary | ICD-10-CM

## 2024-02-27 ENCOUNTER — Inpatient Hospital Stay: Attending: Hematology & Oncology

## 2024-02-27 ENCOUNTER — Inpatient Hospital Stay

## 2024-02-27 ENCOUNTER — Inpatient Hospital Stay (HOSPITAL_BASED_OUTPATIENT_CLINIC_OR_DEPARTMENT_OTHER): Admitting: Family

## 2024-02-27 ENCOUNTER — Encounter: Payer: Self-pay | Admitting: Family

## 2024-02-27 ENCOUNTER — Other Ambulatory Visit: Payer: Self-pay

## 2024-02-27 VITALS — BP 143/63 | HR 73 | Temp 98.3°F | Resp 17 | Ht 70.0 in | Wt 184.0 lb

## 2024-02-27 DIAGNOSIS — C7951 Secondary malignant neoplasm of bone: Secondary | ICD-10-CM

## 2024-02-27 DIAGNOSIS — C7B8 Other secondary neuroendocrine tumors: Secondary | ICD-10-CM

## 2024-02-27 DIAGNOSIS — D500 Iron deficiency anemia secondary to blood loss (chronic): Secondary | ICD-10-CM

## 2024-02-27 DIAGNOSIS — D509 Iron deficiency anemia, unspecified: Secondary | ICD-10-CM

## 2024-02-27 DIAGNOSIS — C7A8 Other malignant neuroendocrine tumors: Secondary | ICD-10-CM | POA: Insufficient documentation

## 2024-02-27 LAB — IRON AND IRON BINDING CAPACITY (CC-WL,HP ONLY)
Iron: 58 ug/dL (ref 45–182)
Saturation Ratios: 19 % (ref 17.9–39.5)
TIBC: 304 ug/dL (ref 250–450)
UIBC: 246 ug/dL (ref 117–376)

## 2024-02-27 LAB — CBC WITH DIFFERENTIAL (CANCER CENTER ONLY)
Abs Immature Granulocytes: 0.05 10*3/uL (ref 0.00–0.07)
Basophils Absolute: 0 10*3/uL (ref 0.0–0.1)
Basophils Relative: 1 %
Eosinophils Absolute: 0.3 10*3/uL (ref 0.0–0.5)
Eosinophils Relative: 7 %
HCT: 32 % — ABNORMAL LOW (ref 39.0–52.0)
Hemoglobin: 10.5 g/dL — ABNORMAL LOW (ref 13.0–17.0)
Immature Granulocytes: 1 %
Lymphocytes Relative: 8 %
Lymphs Abs: 0.4 10*3/uL — ABNORMAL LOW (ref 0.7–4.0)
MCH: 31.5 pg (ref 26.0–34.0)
MCHC: 32.8 g/dL (ref 30.0–36.0)
MCV: 96.1 fL (ref 80.0–100.0)
Monocytes Absolute: 0.3 10*3/uL (ref 0.1–1.0)
Monocytes Relative: 5 %
Neutro Abs: 3.9 10*3/uL (ref 1.7–7.7)
Neutrophils Relative %: 78 %
Platelet Count: 136 10*3/uL — ABNORMAL LOW (ref 150–400)
RBC: 3.33 MIL/uL — ABNORMAL LOW (ref 4.22–5.81)
RDW: 14.5 % (ref 11.5–15.5)
WBC Count: 4.9 10*3/uL (ref 4.0–10.5)
nRBC: 0 % (ref 0.0–0.2)

## 2024-02-27 LAB — RETICULOCYTES
Immature Retic Fract: 14.6 % (ref 2.3–15.9)
RBC.: 3.37 MIL/uL — ABNORMAL LOW (ref 4.22–5.81)
Retic Count, Absolute: 48.2 10*3/uL (ref 19.0–186.0)
Retic Ct Pct: 1.4 % (ref 0.4–3.1)

## 2024-02-27 LAB — CMP (CANCER CENTER ONLY)
ALT: 13 U/L (ref 0–44)
AST: 30 U/L (ref 15–41)
Albumin: 3.9 g/dL (ref 3.5–5.0)
Alkaline Phosphatase: 60 U/L (ref 38–126)
Anion gap: 12 (ref 5–15)
BUN: 20 mg/dL (ref 8–23)
CO2: 24 mmol/L (ref 22–32)
Calcium: 9.7 mg/dL (ref 8.9–10.3)
Chloride: 101 mmol/L (ref 98–111)
Creatinine: 1.19 mg/dL (ref 0.61–1.24)
GFR, Estimated: >60 mL/min (ref >60)
Glucose, Bld: 210 mg/dL — ABNORMAL HIGH (ref 70–99)
Potassium: 5.3 mmol/L — ABNORMAL HIGH (ref 3.5–5.1)
Sodium: 137 mmol/L (ref 135–145)
Total Bilirubin: 0.7 mg/dL (ref 0.0–1.2)
Total Protein: 7.4 g/dL (ref 6.5–8.1)

## 2024-02-27 LAB — FERRITIN: Ferritin: 170 ng/mL (ref 24–336)

## 2024-02-27 MED ORDER — LANREOTIDE ACETATE 120 MG/0.5ML ~~LOC~~ SOLN
120.0000 mg | Freq: Once | SUBCUTANEOUS | Status: AC
  Administered 2024-02-27: 120 mg via SUBCUTANEOUS
  Filled 2024-02-27: qty 120

## 2024-02-27 NOTE — Progress Notes (Signed)
 Hematology and Oncology Follow Up Visit  [facility] [id number] 1946/03/11 78 y.o. 02/27/2024   Principle Diagnosis:  Metastatic low grade neuroendocrine tumor-hepatic metastases Iron  deficiency anemia   Past Diagnosis:  S/p yttrium-90 intrahepatic therapy - November 2016 Lutathera  (Lu 177) injection on 02/01/2018 - s/p cycle 4 SBRT to T6 lesion -- 11/06/2021   Current Therapy:        Somatuline 120 mg q 4 week-start on 08/01/2023  IV Iron  as indicated  --Venofer  given on 06/27/2023 Lutathera -retreatment -09/06/2023 -- s/p 2/2 cycles   Interim History:  [facility] is here today for follow-up and Somatuline injection. He is doing well outside of arthritis in his back. He is sore in the morning but this improves once he is up and moving.  No falls or syncope reported.  He has not had any issue with infections. No fever, chills, n/v, cough, rash, dizziness, SOB, chest pain, palpitations, abdominal pain or changes in bowel or bladder habits.  No swelling in his extremities.  Appetite and hydration are good. Weight is stable at 181 lbs.  He is planning some fun trips for hiking and biking with his brother.   ECOG Performance Status: 1 - Symptomatic but completely ambulatory  Medications:  Allergies as of 02/27/2024       Reactions   Iohexol  Hives    Code: HIVES, Desc: PER [name] @ [facility], PT IS ALLERGIC TO CONTRAST DYE 10/02/08/RM  05/01/10...needs full premeds per our protocol w/ [facility]., Onset Date: [id number]   Iodinated Contrast Media Hives        Medication List        Accurate as of February 27, 2024  9:57 AM. If you have any questions, ask your nurse or doctor.          Abrysvo 120 MCG/0.5ML injection Generic drug: RSV bivalent vaccine Inject 0.5 mLs into the muscle once.   aspirin  EC 81 MG tablet Take 81 mg by mouth every morning.   atorvastatin  80 MG tablet Commonly known as: LIPITOR TAKE 1 TABLET BY MOUTH EVERY DAY   Cholecalciferol  25 MCG (1000 UT)  tablet Take 1,000 Units by mouth 2 (two) times daily.   clopidogrel  75 MG tablet Commonly known as: PLAVIX  TAKE 1 TABLET BY MOUTH EVERY DAY   cyanocobalamin  1000 MCG tablet Commonly known as: VITAMIN B12 Take 1,000 mcg by mouth every other day.   diphenhydramine -acetaminophen  25-500 MG Tabs tablet Commonly known as: TYLENOL  PM Take 1 tablet by mouth at bedtime as needed (sleep/pain).   fluticasone  50 MCG/ACT nasal spray Commonly known as: FLONASE  Place 2 sprays into both nostrils daily as needed (sinuses).   hydrocortisone  25 MG suppository Commonly known as: ANUSOL -HC INSERT 1 SUPPOSITORY RECTALLY TWICE A DAY AS NEEDED FOR HEMORRHOIDS   ipratropium 0.03 % nasal spray Commonly known as: ATROVENT  Place 2 sprays into the nose 3 (three) times daily as needed for rhinitis.   lansoprazole  30 MG capsule Commonly known as: PREVACID  TAKE 1 CAPSULE (30 MG TOTAL) BY MOUTH DAILY AT 12 NOON.   levothyroxine  50 MCG tablet Commonly known as: SYNTHROID  TAKE 1 TABLET BY MOUTH DAILY BEFORE BREAKFAST   LORazepam  0.5 MG tablet Commonly known as: Ativan  1 tab po 30 minutes prior to radiation or MRI scans   losartan  100 MG tablet Commonly known as: COZAAR  Take 1 tablet (100 mg total) by mouth daily.   metoprolol  tartrate 25 MG tablet Commonly known as: LOPRESSOR  TAKE 1/2 TABLET TWICE A DAY BY MOUTH  nitroGLYCERIN  0.4 MG SL tablet Commonly known as: NITROSTAT  PLACE 1 TABLET UNDER THE TONGUE EVERY 5 (FIVE) MINUTES X 3 DOSES AS NEEDED FOR CHEST PAIN.   ondansetron  8 MG tablet Commonly known as: ZOFRAN  Take 1 tablet (8 mg total) by mouth 2 (two) times daily as needed for nausea or vomiting.   ranolazine  500 MG 12 hr tablet Commonly known as: RANEXA  Take 1 tablet by mouth twice daily   SOMATULINE DEPOT  [place] Inject 120 mcg into the skin every 28 (twenty-eight) days. Receives at Dr Birt [facility] office   SOOTHE XP OP Place 1 drop into both eyes daily as needed (dry eyes).         Allergies:  Allergies  Allergen Reactions   Iohexol  Hives     Code: HIVES, Desc: PER [name] @ [facility], PT IS ALLERGIC TO CONTRAST DYE 10/02/08/RM  05/01/10...needs full premeds per our protocol w/ [facility]., Onset Date: [id number]    Iodinated Contrast Media Hives    Past Medical History, Surgical history, Social history, and Family History were reviewed and updated.  Review of Systems: All other 10 point review of systems is negative.   Physical Exam:  vitals were not taken for this visit.   Wt Readings from Last 3 Encounters:  01/30/24 187 lb (84.8 kg)  01/02/24 187 lb (84.8 kg)  11/14/23 187 lb (84.8 kg)    Ocular: Sclerae unicteric, pupils equal, round and reactive to light Ear-nose-throat: Oropharynx clear, dentition fair Lymphatic: No cervical or supraclavicular adenopathy Lungs no rales or rhonchi, good excursion bilaterally Heart regular rate and rhythm, no murmur appreciated Abd soft, nontender, positive bowel sounds MSK no focal spinal tenderness, no joint edema Neuro: non-focal, well-oriented, appropriate affect Breasts: Deferred   Lab Results  Component Value Date   WBC 5.3 01/30/2024   HGB 10.1 (L) 01/30/2024   HCT 31.3 (L) 01/30/2024   MCV 97.8 01/30/2024   PLT 129 (L) 01/30/2024   Lab Results  Component Value Date   FERRITIN 123 01/30/2024   IRON  62 01/30/2024   TIBC 290 01/30/2024   UIBC 228 01/30/2024   IRONPCTSAT 21 01/30/2024   Lab Results  Component Value Date   RETICCTPCT 1.1 01/02/2024   RBC 3.20 (L) 01/30/2024   No results found for: "KPAFRELGTCHN", "LAMBDASER", "KAPLAMBRATIO" No results found for: "IGGSERUM", "IGA", "IGMSERUM" No results found for: "TOTALPROTELP", "ALBUMINELP", "A1GS", "A2GS", "BETS", "BETA2SER", "GAMS", "MSPIKE", "SPEI"   Chemistry      Component Value Date/Time   NA 141 01/30/2024 0942   NA 141 06/22/2022 0942   NA 144 10/04/2017 0904   NA 141 07/23/2016 1255   K 4.1 01/30/2024 0942   K 3.8 10/04/2017  0904   K 4.1 07/23/2016 1255   CL 105 01/30/2024 0942   CL 103 10/04/2017 0904   CO2 31 01/30/2024 0942   CO2 28 10/04/2017 0904   CO2 27 07/23/2016 1255   BUN 19 01/30/2024 0942   BUN 20 06/22/2022 0942   BUN 14 10/04/2017 0904   BUN 13.8 07/23/2016 1255   CREATININE 0.99 01/30/2024 0942   CREATININE 0.8 10/04/2017 0904   CREATININE 1.0 07/23/2016 1255      Component Value Date/Time   CALCIUM  9.2 01/30/2024 0942   CALCIUM  9.4 10/04/2017 0904   CALCIUM  9.2 07/23/2016 1255   ALKPHOS 47 01/30/2024 0942   ALKPHOS 64 10/04/2017 0904   ALKPHOS 77 07/23/2016 1255   AST 19 01/30/2024 0942   AST 23 07/23/2016 1255   ALT 11 01/30/2024  0942   ALT 22 10/04/2017 0904   ALT 13 07/23/2016 1255   BILITOT 0.7 01/30/2024 0942   BILITOT 1.13 07/23/2016 1255       Impression and Plan: Mr. Apostle is a very pleasant 78 yo caucasian gentleman with metastatic low-grade neuroendocrine carcinoma with hepatic metastasis and bone metastasis.   He completed his Lutathera  treatments in 11/2023 and PET in 12/2023 showed stable disease.  Chromogranin A pending. Last month level was 151. We will proceed with Somatuline as planned.  We will repeat his PET after [facility] Day.  Follow-up in 1 month.   Kennard Pea, NP 4/28/20259:57 AM

## 2024-02-29 LAB — CHROMOGRANIN A: Chromogranin A (ng/mL): 179.1 ng/mL — ABNORMAL HIGH (ref 0.0–101.8)

## 2024-03-16 ENCOUNTER — Other Ambulatory Visit: Payer: Self-pay | Admitting: Family Medicine

## 2024-03-16 DIAGNOSIS — J3489 Other specified disorders of nose and nasal sinuses: Secondary | ICD-10-CM

## 2024-03-23 NOTE — Progress Notes (Unsigned)
 [facility] [address] [place], [place] [address] [phone]  Date:  03/28/2024   Name:  Brian Mays   DOB:  Nov 02, 1945   MRN:  [id number]  PCP:  Brian Partridge, MD    Chief Complaint: No chief complaint on file.   History of Present Illness:  Brian Mays is a 78 y.o. very pleasant male patient who presents with the following:  Patient seen today for follow-up of arthritis-  history of metastatic malignant neuroendocrine tumor in the liver/carcinoid syndrome status post partial small bowel resection in 2010, as well as iron  malabsorption, CAD status post stenting in 2017, hypothyroidism, hypertension, chronic pancreatitis    Most recent visit with myself was in December for sick visit, we did his physical in October  He saw oncology about a month ago-they plan to do a PET scan soon Principle Diagnosis:  Metastatic low grade neuroendocrine tumor-hepatic metastases Iron  deficiency anemia Past Diagnosis:  S/p yttrium-90 intrahepatic therapy - November 2016 Lutathera  (Lu 177) injection on 02/01/2018 - s/p cycle 4 SBRT to T6 lesion -- 11/06/2021 Current Therapy:        Somatuline 120 mg q 4 week-start on 08/01/2023  IV Iron  as indicated  --Venofer  given on 06/27/2023 Lutathera -retreatment -09/06/2023 -- s/p 2/2 cycles              He has completed Shingrix Received RSV vaccine in October  Aspirin , Plavix  Lipitor Somatuline depot  every 28 days Synthroid  50 Metoprolol  12.5 twice daily B12 Lab Results  Component Value Date   TSH 3.48 08/04/2023   Lab Results  Component Value Date   VITAMINB12 539 08/04/2023    Patient Active Problem List   Diagnosis Date Noted   Metastatic cancer to spine (HCC) 02/08/2022   Prediabetes 09/03/2021   Near syncope 04/22/2020   Iron  deficiency anemia due to chronic blood loss 03/29/2019   Iron  malabsorption 03/29/2019   Lumbar radiculopathy 08/16/2017   CAD  (coronary artery disease) 09/21/2016   Unstable angina (HCC) 02/09/2016   Vitamin D  deficiency 03/11/2015   Metastatic malignant neuroendocrine tumor to liver (HCC) 02/14/2015   Hyperglycemia 04/03/2012   Diarrhea 03/29/2012   B12 deficiency 04/19/2011   Antiplatelet or antithrombotic long-term use 03/30/2011   TOBACCO USE, QUIT 10/13/2009   Unspecified vitamin D  deficiency 06/19/2009   Neoplasm by body site 12/23/2008   DIVERTICULOSIS, COLON 11/08/2008   ABDOMINAL PAIN 10/02/2008   Hypothyroidism 12/13/2007   HEMORRHOIDS, NOS 12/13/2007   Belching 12/13/2007   History of colonic polyps 12/13/2007   Essential hypertension 09/15/2007   Gastroesophageal reflux disease 09/15/2007   PANCREATITIS, CHRONIC 09/15/2007   CARDIAC MURMUR 09/15/2007   SNORING 09/15/2007    Past Medical History:  Diagnosis Date   Anemia    in past   Cataract    Colon polyps    Diverticulosis    Gallstones    GERD (gastroesophageal reflux disease)    Heart attack (HCC)    mild, Spring 2017   Heart murmur    Hemorrhoid    Hiatal hernia    HTN (hypertension)    Hyperlipidemia    Hypothyroidism    Iron  deficiency anemia due to chronic blood loss 03/29/2019   Iron  malabsorption 03/29/2019   Metastatic carcinoma (HCC) 2010   [facility]   Neuroendocrine cancer [facility]) 08/2021   Pancreatitis 1998   chronic    Past Surgical History:  Procedure Laterality Date   APPENDECTOMY  1962   CARDIAC CATHETERIZATION N/A 02/10/2016   Procedure: Left Heart Cath and Coronary Angiography;  Surgeon: Brian Perl, MD;  Location: [facility];  Service: Cardiovascular;  Laterality: N/A;   CARDIAC CATHETERIZATION  02/10/2016   Procedure: Coronary/Graft Atherectomy;  Surgeon: Brian Perl, MD;  Location: [facility] INVASIVE CV LAB;  Service: Cardiovascular;;   CARDIAC CATHETERIZATION  02/10/2016   Procedure: Coronary Stent Intervention;  Surgeon: Brian Perl, MD;  Location: [facility]VAS[facility] CV LAB;  Service: Cardiovascular;;   CATARACT  EXTRACTION W/ INTRAOCULAR LENS  IMPLANT, BILATERAL Bilateral    CORONARY STENT INTERVENTION N/A 12/16/2021   Procedure: CORONARY STENT INTERVENTION;  Surgeon: Brian Phy, MD;  Location: [facility] INVASIVE CV LAB;  Service: Cardiovascular;  Laterality: N/A;   IR GENERIC HISTORICAL  12/30/2015   IR RADIOLOGIST EVAL & MGMT 12/30/2015 Brian Hau, MD [facility] INTERV RAD   IR GENERIC HISTORICAL  11/09/2016   IR RADIOLOGIST EVAL & MGMT 11/09/2016 Brian Hau, MD [facility] INTERV RAD   IR RADIOLOGIST EVAL & MGMT  03/01/2017   IR RADIOLOGIST EVAL & MGMT  06/29/2017   LAPAROSCOPIC CHOLECYSTECTOMY  1999   LEFT HEART CATH AND CORONARY ANGIOGRAPHY N/A 12/16/2021   Procedure: LEFT HEART CATH AND CORONARY ANGIOGRAPHY;  Surgeon: Brian Phy, MD;  Location: [facility] INVASIVE CV LAB;  Service: Cardiovascular;  Laterality: N/A;   LIVER BIOPSY  2010   RADIOACTIVE SEED IMPLANT  X 3   "to my liver"   TUMOR EXCISION  01/2009   Carcinoil Resection    TUMOR REMOVAL     from small intestine    Social History   Tobacco Use   Smoking status: Former    Current packs/day: 0.00    Average packs/day: 1.5 packs/day for 38.2 years (57.4 ttl pk-yrs)    Types: Cigarettes    Start date: 09/25/1959    Quit date: 12/21/1997    Years since quitting: 26.2   Smokeless tobacco: Never  Vaping Use   Vaping status: Never Used  Substance Use Topics   Alcohol use: Yes    Alcohol/week: 3.0 - 4.0 standard drinks of alcohol    Types: 3 - 4 Cans of beer per week    Comment: 1-2 beers twice a week   Drug use: No    Family History  Problem Relation Age of Onset   Kidney disease Mother    Hyperlipidemia Mother    Hypertension Mother    COPD Father    Ulcerative colitis Daughter    Colon cancer Neg Hx    Esophageal cancer Neg Hx    Rectal cancer Neg Hx    Stomach cancer Neg Hx     Allergies  Allergen Reactions   Iohexol  Hives     Code: HIVES, Desc: PER [name] @ [facility], PT IS ALLERGIC TO CONTRAST DYE 10/02/08/RM   05/01/10...needs full premeds per our protocol w/ [facility]., Onset Date: 16109604    Iodinated Contrast Media Hives    Medication list has been reviewed and updated.  Current Outpatient Medications on File Prior to Visit  Medication Sig Dispense Refill   ABRYSVO 120 MCG/0.5ML injection Inject 0.5 mLs into the muscle once.     Artificial Tear Solution (SOOTHE XP OP) Place 1 drop into both eyes daily as needed (dry eyes).     aspirin  EC 81 MG tablet Take 81 mg by mouth every morning.     atorvastatin  (LIPITOR) 80 MG tablet TAKE 1 TABLET BY MOUTH EVERY DAY 90 tablet 1  Cholecalciferol  1000 UNITS tablet Take 1,000 Units by mouth 2 (two) times daily.     clopidogrel  (PLAVIX ) 75 MG tablet TAKE 1 TABLET BY MOUTH EVERY DAY 90 tablet 3   diphenhydramine -acetaminophen  (TYLENOL  PM) 25-500 MG TABS tablet Take 1 tablet by mouth at bedtime as needed (sleep/pain).     fluticasone  (FLONASE ) 50 MCG/ACT nasal spray Place 2 sprays into both nostrils daily as needed (sinuses).  11   hydrocortisone  (ANUSOL -HC) 25 MG suppository INSERT 1 SUPPOSITORY RECTALLY TWICE A DAY AS NEEDED FOR HEMORRHOIDS 30 suppository 0   ipratropium (ATROVENT ) 0.03 % nasal spray PLACE 2 SPRAYS INTO THE NOSE 3 (THREE) TIMES DAILY AS NEEDED FOR RHINITIS. 90 mL 0   Lanreotide Acetate  (SOMATULINE DEPOT  [place]) Inject 120 mcg into the skin every 28 (twenty-eight) days. Receives at [facility]     lansoprazole  (PREVACID ) 30 MG capsule TAKE 1 CAPSULE (30 MG TOTAL) BY MOUTH DAILY AT 12 NOON. 90 capsule 1   levothyroxine  (SYNTHROID ) 50 MCG tablet TAKE 1 TABLET BY MOUTH DAILY BEFORE BREAKFAST 90 tablet 1   LORazepam  (ATIVAN ) 0.5 MG tablet 1 tab po 30 minutes prior to radiation or MRI scans (Patient not taking: Reported on 02/27/2024) 5 tablet 0   losartan  (COZAAR ) 100 MG tablet Take 1 tablet (100 mg total) by mouth daily. 90 tablet 3   metoprolol  tartrate (LOPRESSOR ) 25 MG tablet TAKE 1/2 TABLET TWICE A DAY BY MOUTH 90 tablet 2    nitroGLYCERIN  (NITROSTAT ) 0.4 MG SL tablet PLACE 1 TABLET UNDER THE TONGUE EVERY 5 (FIVE) MINUTES X 3 DOSES AS NEEDED FOR CHEST PAIN. 25 tablet 4   ondansetron  (ZOFRAN ) 8 MG tablet Take 1 tablet (8 mg total) by mouth 2 (two) times daily as needed for nausea or vomiting. 20 tablet 0   ranolazine  (RANEXA ) 500 MG 12 hr tablet Take 1 tablet by mouth twice daily 180 tablet 1   vitamin B-12 (CYANOCOBALAMIN ) 1000 MCG tablet Take 1,000 mcg by mouth every other day.     Current Facility-Administered Medications on File Prior to Visit  Medication Dose Route Frequency Provider Last Rate Last Admin   lanreotide acetate  (SOMATULINE DEPOT ) injection 120 mg  120 mg Subcutaneous Q14 Days Ennever, Sherryll Donald, MD        Review of Systems:  As per HPI- otherwise negative.   Physical Examination: There were no vitals filed for this visit. There were no vitals filed for this visit. There is no height or weight on file to calculate BMI. Ideal Body Weight:    GEN: no acute distress. HEENT: Atraumatic, Normocephalic.  Ears and Nose: No external deformity. CV: RRR, No M/G/R. No JVD. No thrill. No extra heart sounds. PULM: CTA B, no wheezes, crackles, rhonchi. No retractions. No resp. distress. No accessory muscle use. ABD: S, NT, ND, +BS. No rebound. No HSM. EXTR: No c/c/e PSYCH: Normally interactive. Conversant. '  Assessment and Plan: ***  Signed Gates Kasal, MD

## 2024-03-23 NOTE — Patient Instructions (Incomplete)
 It was great to see you again today- robaxin as needed for pain, you might want to take at bedtime. This can make you a bit drowsy

## 2024-03-28 ENCOUNTER — Inpatient Hospital Stay: Attending: Hematology & Oncology

## 2024-03-28 ENCOUNTER — Inpatient Hospital Stay

## 2024-03-28 ENCOUNTER — Ambulatory Visit (INDEPENDENT_AMBULATORY_CARE_PROVIDER_SITE_OTHER): Admitting: Family Medicine

## 2024-03-28 ENCOUNTER — Encounter: Payer: Self-pay | Admitting: Family Medicine

## 2024-03-28 ENCOUNTER — Inpatient Hospital Stay (HOSPITAL_BASED_OUTPATIENT_CLINIC_OR_DEPARTMENT_OTHER): Admitting: Family

## 2024-03-28 ENCOUNTER — Encounter: Payer: Self-pay | Admitting: Family

## 2024-03-28 VITALS — BP 130/80 | HR 67 | Temp 98.0°F | Ht 70.0 in | Wt 184.0 lb

## 2024-03-28 VITALS — BP 180/61 | HR 60 | Temp 98.1°F | Resp 18 | Wt 181.1 lb

## 2024-03-28 DIAGNOSIS — C7B8 Other secondary neuroendocrine tumors: Secondary | ICD-10-CM | POA: Insufficient documentation

## 2024-03-28 DIAGNOSIS — D500 Iron deficiency anemia secondary to blood loss (chronic): Secondary | ICD-10-CM

## 2024-03-28 DIAGNOSIS — M549 Dorsalgia, unspecified: Secondary | ICD-10-CM

## 2024-03-28 DIAGNOSIS — K769 Liver disease, unspecified: Secondary | ICD-10-CM | POA: Diagnosis not present

## 2024-03-28 DIAGNOSIS — C801 Malignant (primary) neoplasm, unspecified: Secondary | ICD-10-CM

## 2024-03-28 DIAGNOSIS — C7A8 Other malignant neuroendocrine tumors: Secondary | ICD-10-CM | POA: Diagnosis not present

## 2024-03-28 DIAGNOSIS — C7951 Secondary malignant neoplasm of bone: Secondary | ICD-10-CM

## 2024-03-28 LAB — CBC WITH DIFFERENTIAL (CANCER CENTER ONLY)
Abs Immature Granulocytes: 0.02 10*3/uL (ref 0.00–0.07)
Basophils Absolute: 0 10*3/uL (ref 0.0–0.1)
Basophils Relative: 1 %
Eosinophils Absolute: 0.4 10*3/uL (ref 0.0–0.5)
Eosinophils Relative: 8 %
HCT: 33 % — ABNORMAL LOW (ref 39.0–52.0)
Hemoglobin: 10.6 g/dL — ABNORMAL LOW (ref 13.0–17.0)
Immature Granulocytes: 0 %
Lymphocytes Relative: 9 %
Lymphs Abs: 0.4 10*3/uL — ABNORMAL LOW (ref 0.7–4.0)
MCH: 31.1 pg (ref 26.0–34.0)
MCHC: 32.1 g/dL (ref 30.0–36.0)
MCV: 96.8 fL (ref 80.0–100.0)
Monocytes Absolute: 0.3 10*3/uL (ref 0.1–1.0)
Monocytes Relative: 6 %
Neutro Abs: 3.5 10*3/uL (ref 1.7–7.7)
Neutrophils Relative %: 76 %
Platelet Count: 145 10*3/uL — ABNORMAL LOW (ref 150–400)
RBC: 3.41 MIL/uL — ABNORMAL LOW (ref 4.22–5.81)
RDW: 14.5 % (ref 11.5–15.5)
WBC Count: 4.5 10*3/uL (ref 4.0–10.5)
nRBC: 0 % (ref 0.0–0.2)

## 2024-03-28 LAB — CMP (CANCER CENTER ONLY)
ALT: 13 U/L (ref 0–44)
AST: 23 U/L (ref 15–41)
Albumin: 4 g/dL (ref 3.5–5.0)
Alkaline Phosphatase: 50 U/L (ref 38–126)
Anion gap: 7 (ref 5–15)
BUN: 17 mg/dL (ref 8–23)
CO2: 29 mmol/L (ref 22–32)
Calcium: 9.1 mg/dL (ref 8.9–10.3)
Chloride: 103 mmol/L (ref 98–111)
Creatinine: 1.13 mg/dL (ref 0.61–1.24)
GFR, Estimated: >60 mL/min (ref >60)
Glucose, Bld: 133 mg/dL — ABNORMAL HIGH (ref 70–99)
Potassium: 4.2 mmol/L (ref 3.5–5.1)
Sodium: 139 mmol/L (ref 135–145)
Total Bilirubin: 0.8 mg/dL (ref 0.0–1.2)
Total Protein: 6.7 g/dL (ref 6.5–8.1)

## 2024-03-28 LAB — LACTATE DEHYDROGENASE: LDH: 188 U/L (ref 98–192)

## 2024-03-28 MED ORDER — LANREOTIDE ACETATE 120 MG/0.5ML ~~LOC~~ SOLN
120.0000 mg | SUBCUTANEOUS | Status: DC
  Administered 2024-03-28: 120 mg via SUBCUTANEOUS
  Filled 2024-03-28: qty 120

## 2024-03-28 MED ORDER — METHOCARBAMOL 500 MG PO TABS: 500.0000 mg | ORAL_TABLET | Freq: Three times a day (TID) | ORAL | 0 refills | Status: DC | PRN

## 2024-03-28 NOTE — Patient Instructions (Signed)
 Lanreotide Injection What is this medication? LANREOTIDE (lan REE oh tide) treats high levels of growth hormone (acromegaly). It is used when other therapies have not worked well enough or cannot be tolerated. It works by reducing the amount of growth hormone your body makes. This reduces symptoms and the risk of health problems caused by too much growth hormone, such as diabetes and heart disease. It may also be used to treat neuroendocrine tumors, a cancer of the cells that release hormones and other substances in your body. It works by slowing down the release of these substances from the cells. This slows tumor growth. It also decreases the symptoms of carcinoid syndrome, such as flushing or diarrhea. This medicine may be used for other purposes; ask your health care provider or pharmacist if you have questions. COMMON BRAND NAME(S): Somatuline [place] What should I tell my care team before I take this medication? They need to know if you have any of these conditions: Diabetes Gallbladder disease Heart disease Kidney disease Liver disease Pancreatic disease Thyroid disease An unusual or allergic reaction to lanreotide, other medications, foods, dyes, or preservatives Pregnant or trying to get pregnant Breastfeeding How should I use this medication? This medication is injected under the skin. It is given by your care team in a hospital or clinic setting. Talk to your care team about the use of this medication in children. Special care may be needed. Overdosage: If you think you have taken too much of this medicine contact a poison control center or emergency room at once. NOTE: This medicine is only for you. Do not share this medicine with others. What if I miss a dose? Keep appointments for follow-up doses. It is important not to miss your dose. Call your care team if you are unable to keep an appointment. What may interact with this medication? Bromocriptine Cyclosporine Certain  medications for blood pressure, heart disease, irregular heartbeat Certain medications for diabetes Quinidine Terfenadine This list may not describe all possible interactions. Give your health care provider a list of all the medicines, herbs, non-prescription drugs, or dietary supplements you use. Also tell them if you smoke, drink alcohol, or use illegal drugs. Some items may interact with your medicine. What should I watch for while using this medication? Visit your care team for regular checks on your progress. Tell your care team if your symptoms do not start to get better or if they get worse. Your condition will be monitored carefully while you are receiving this medication. You may need blood work while you are taking this medication. This medication may increase blood sugar. The risk may be higher in patients who already have diabetes. Ask your care team what you can do to lower your risk of diabetes while taking this medication. Talk to your care team if you wish to become pregnant or think you may be pregnant. This medication can cause serious birth defects. Do not breast-feed while taking this medication and for 6 months after stopping therapy. This medication may cause infertility. Talk to your care team if you are concerned about your fertility. What side effects may I notice from receiving this medication? Side effects that you should report to your care team as soon as possible: Allergic reactions--skin rash, itching, hives, swelling of the face, lips, tongue, or throat Gallbladder problems--severe stomach pain, nausea, vomiting, fever High blood sugar (hyperglycemia)--increased thirst or amount of urine, unusual weakness or fatigue, blurry vision Increase in blood pressure Low blood sugar (hypoglycemia)--pale, blue or purple  skin or lips, sweating, fussiness, rapid heartbeat, poor feeding, low body temperature Low thyroid levels (hypothyroidism)--unusual weakness or fatigue,  increased sensitivity to cold, constipation, hair loss, dry skin, weight gain, feelings of depression Oily or light-colored stools, diarrhea, bloating, weight loss Slow heartbeat--dizziness, feeling faint or lightheaded, confusion, trouble breathing, unusual weakness or fatigue Side effects that usually do not require medical attention (report these to your care team if they continue or are bothersome): Diarrhea Dizziness Headache Muscle spasms Nausea Pain, redness, or irritation at injection site Stomach pain This list may not describe all possible side effects. Call your doctor for medical advice about side effects. You may report side effects to FDA at 1-800-FDA-1088. Where should I keep my medication? This medication is given in a hospital or clinic. It will not be stored at home. NOTE: This sheet is a summary. It may not cover all possible information. If you have questions about this medicine, talk to your doctor, pharmacist, or health care provider.  2024 Elsevier/Gold Standard (2023-09-30 00:00:00)

## 2024-03-28 NOTE — Progress Notes (Signed)
 Hematology and Oncology Follow Up Visit  Brian Mays [id number] 1946-06-16 78 y.o. 03/28/2024   Principle Diagnosis:  Metastatic low grade neuroendocrine tumor-hepatic metastases Iron  deficiency anemia   Past Diagnosis:  S/p yttrium-90 intrahepatic therapy - November 2016 Lutathera  (Lu 177) injection on 02/01/2018 - s/p cycle 4 SBRT to T6 lesion -- 11/06/2021   Current Therapy:        Somatuline 120 mg q 4 week-start on 08/01/2023  IV Iron  as indicated  --Venofer  given on 06/27/2023 Lutathera -retreatment -09/06/2023 -- s/p 2/2 cycles   Interim History:  Brian Mays is here today for follow-up. He is doing well but still has some fatigue at times.  He has been golfing once a week with his buddies and is hoping to take his granddaughter one of these days.  He is also looking at E bikes for a trip up [place] with his brother.  No fever, chills, n/v, cough, rash, dizziness, SOB, chest pain, palpitations, abdominal pain or changes in bowel or bladder habits at this time.  No swelling in his extremities.  Neuropathy in his lower extremities is unchanged from baseline.  No falls or syncope reported.  Appetite and hydration are good. Weight is 181 lbs.   ECOG Performance Status: 1 - Symptomatic but completely ambulatory  Medications:  Allergies as of 03/28/2024       Reactions   Iohexol  Hives    Code: HIVES, Desc: PER [name] @ [facility], PT IS ALLERGIC TO CONTRAST DYE 10/02/08/RM  05/01/10...needs full premeds per our protocol w/ [facility]., Onset Date: [id number]   Iodinated Contrast Media Hives        Medication List        Accurate as of Mar 28, 2024 11:43 AM. If you have any questions, ask your nurse or doctor.          Abrysvo 120 MCG/0.5ML injection Generic drug: RSV bivalent vaccine Inject 0.5 mLs into the muscle once.   aspirin  EC 81 MG tablet Take 81 mg by mouth every morning.   atorvastatin  80 MG tablet Commonly known as: LIPITOR TAKE 1 TABLET BY MOUTH EVERY  DAY   Cholecalciferol  25 MCG (1000 UT) tablet Take 1,000 Units by mouth 2 (two) times daily.   clopidogrel  75 MG tablet Commonly known as: PLAVIX  TAKE 1 TABLET BY MOUTH EVERY DAY   cyanocobalamin  1000 MCG tablet Commonly known as: VITAMIN B12 Take 1,000 mcg by mouth every other day.   diphenhydramine -acetaminophen  25-500 MG Tabs tablet Commonly known as: TYLENOL  PM Take 1 tablet by mouth at bedtime as needed (sleep/pain).   fluticasone  50 MCG/ACT nasal spray Commonly known as: FLONASE  Place 2 sprays into both nostrils daily as needed (sinuses).   hydrocortisone  25 MG suppository Commonly known as: ANUSOL -HC INSERT 1 SUPPOSITORY RECTALLY TWICE A DAY AS NEEDED FOR HEMORRHOIDS   ipratropium 0.03 % nasal spray Commonly known as: ATROVENT  PLACE 2 SPRAYS INTO THE NOSE 3 (THREE) TIMES DAILY AS NEEDED FOR RHINITIS.   lansoprazole  30 MG capsule Commonly known as: PREVACID  TAKE 1 CAPSULE (30 MG TOTAL) BY MOUTH DAILY AT 12 NOON.   levothyroxine  50 MCG tablet Commonly known as: SYNTHROID  TAKE 1 TABLET BY MOUTH DAILY BEFORE BREAKFAST   LORazepam  0.5 MG tablet Commonly known as: Ativan  1 tab po 30 minutes prior to radiation or MRI scans   losartan  100 MG tablet Commonly known as: COZAAR  Take 1 tablet (100 mg total) by mouth daily.   metoprolol  tartrate 25 MG tablet Commonly known as: LOPRESSOR  TAKE  1/2 TABLET TWICE A DAY BY MOUTH   nitroGLYCERIN  0.4 MG SL tablet Commonly known as: NITROSTAT  PLACE 1 TABLET UNDER THE TONGUE EVERY 5 (FIVE) MINUTES X 3 DOSES AS NEEDED FOR CHEST PAIN.   ondansetron  8 MG tablet Commonly known as: ZOFRAN  Take 1 tablet (8 mg total) by mouth 2 (two) times daily as needed for nausea or vomiting.   ranolazine  500 MG 12 hr tablet Commonly known as: RANEXA  Take 1 tablet by mouth twice daily   SOMATULINE DEPOT  [place] Inject 120 mcg into the skin every 28 (twenty-eight) days. Receives at Dr Birt Bulla office   SOOTHE XP OP Place 1 drop into both eyes  daily as needed (dry eyes).        Allergies:  Allergies  Allergen Reactions   Iohexol  Hives     Code: HIVES, Desc: PER [name] @ [facility], PT IS ALLERGIC TO CONTRAST DYE 10/02/08/RM  05/01/10...needs full premeds per our protocol w/ [facility]., Onset Date: [id number]    Iodinated Contrast Media Hives    Past Medical History, Surgical history, Social history, and Family History were reviewed and updated.  Review of Systems: All other 10 point review of systems is negative.   Physical Exam:  weight is 181 lb 1.9 oz (82.2 kg). His oral temperature is 98.1 F (36.7 C). His blood pressure is 180/61 (abnormal) and his pulse is 60. His respiration is 18 and oxygen saturation is 100%.   Wt Readings from Last 3 Encounters:  03/28/24 181 lb 1.9 oz (82.2 kg)  02/27/24 184 lb (83.5 kg)  01/30/24 187 lb (84.8 kg)    Ocular: Sclerae unicteric, pupils equal, round and reactive to light Ear-nose-throat: Oropharynx clear, dentition fair Lymphatic: No cervical or supraclavicular adenopathy Lungs no rales or rhonchi, good excursion bilaterally Heart regular rate and rhythm, no murmur appreciated Abd soft, nontender, positive bowel sounds MSK no focal spinal tenderness, no joint edema Neuro: non-focal, well-oriented, appropriate affect Breasts: Deferred   Lab Results  Component Value Date   WBC 4.5 03/28/2024   HGB 10.6 (L) 03/28/2024   HCT 33.0 (L) 03/28/2024   MCV 96.8 03/28/2024   PLT 145 (L) 03/28/2024   Lab Results  Component Value Date   FERRITIN 170 02/27/2024   IRON  58 02/27/2024   TIBC 304 02/27/2024   UIBC 246 02/27/2024   IRONPCTSAT 19 02/27/2024   Lab Results  Component Value Date   RETICCTPCT 1.4 02/27/2024   RBC 3.41 (L) 03/28/2024   No results found for: "KPAFRELGTCHN", "LAMBDASER", "KAPLAMBRATIO" No results found for: "IGGSERUM", "IGA", "IGMSERUM" No results found for: "TOTALPROTELP", "ALBUMINELP", "A1GS", "A2GS", "BETS", "BETA2SER", "GAMS", "MSPIKE",  "SPEI"   Chemistry      Component Value Date/Time   NA 139 03/28/2024 1027   NA 141 06/22/2022 0942   NA 144 10/04/2017 0904   NA 141 07/23/2016 1255   K 4.2 03/28/2024 1027   K 3.8 10/04/2017 0904   K 4.1 07/23/2016 1255   CL 103 03/28/2024 1027   CL 103 10/04/2017 0904   CO2 29 03/28/2024 1027   CO2 28 10/04/2017 0904   CO2 27 07/23/2016 1255   BUN 17 03/28/2024 1027   BUN 20 06/22/2022 0942   BUN 14 10/04/2017 0904   BUN 13.8 07/23/2016 1255   CREATININE 1.13 03/28/2024 1027   CREATININE 0.8 10/04/2017 0904   CREATININE 1.0 07/23/2016 1255      Component Value Date/Time   CALCIUM  9.1 03/28/2024 1027   CALCIUM  9.4 10/04/2017 0904  CALCIUM  9.2 07/23/2016 1255   ALKPHOS 50 03/28/2024 1027   ALKPHOS 64 10/04/2017 0904   ALKPHOS 77 07/23/2016 1255   AST 23 03/28/2024 1027   AST 23 07/23/2016 1255   ALT 13 03/28/2024 1027   ALT 22 10/04/2017 0904   ALT 13 07/23/2016 1255   BILITOT 0.8 03/28/2024 1027   BILITOT 1.13 07/23/2016 1255       Impression and Plan: Brian Mays is a very pleasant 78 yo caucasian gentleman with metastatic low-grade neuroendocrine carcinoma with hepatic metastasis and bone metastasis.   He completed his Lutathera  treatments in 11/2023 and PET in 12/2023 showed stable disease.  Chromogranin A pending. Last month level was 151. We will proceed with Somatuline as planned.  Chromogranin A last month was 179 (previously 151). Today's result is pending.  We will repeat his PET in 2-3 weeks.  Follow-up in 1 month.   Kennard Pea, NP 5/28/202511:43 AM

## 2024-03-30 LAB — CHROMOGRANIN A: Chromogranin A (ng/mL): 159.4 ng/mL — ABNORMAL HIGH (ref 0.0–101.8)

## 2024-04-16 ENCOUNTER — Encounter (HOSPITAL_COMMUNITY)
Admission: RE | Admit: 2024-04-16 | Discharge: 2024-04-16 | Disposition: A | Discharge status: Home / Self Care | Source: Ambulatory Visit | Attending: Family | Admitting: Family

## 2024-04-16 DIAGNOSIS — C7B8 Other secondary neuroendocrine tumors: Secondary | ICD-10-CM | POA: Insufficient documentation

## 2024-04-16 DIAGNOSIS — K769 Liver disease, unspecified: Secondary | ICD-10-CM | POA: Insufficient documentation

## 2024-04-16 DIAGNOSIS — R932 Abnormal findings on diagnostic imaging of liver and biliary tract: Secondary | ICD-10-CM | POA: Diagnosis not present

## 2024-04-16 DIAGNOSIS — C801 Malignant (primary) neoplasm, unspecified: Secondary | ICD-10-CM | POA: Insufficient documentation

## 2024-04-16 DIAGNOSIS — C7951 Secondary malignant neoplasm of bone: Secondary | ICD-10-CM | POA: Diagnosis not present

## 2024-04-16 DIAGNOSIS — C7B02 Secondary carcinoid tumors of liver: Secondary | ICD-10-CM | POA: Diagnosis not present

## 2024-04-16 DIAGNOSIS — C7A8 Other malignant neuroendocrine tumors: Secondary | ICD-10-CM | POA: Diagnosis not present

## 2024-04-16 MED ORDER — COPPER CU 64 DOTATATE 1 MCI/ML IV SOLN
4.0000 | Freq: Once | INTRAVENOUS | Status: AC
Start: 2024-04-16 — End: 2024-04-16
  Administered 2024-04-16: 4 via INTRAVENOUS

## 2024-04-18 ENCOUNTER — Ambulatory Visit: Payer: Self-pay

## 2024-04-26 ENCOUNTER — Inpatient Hospital Stay

## 2024-04-26 ENCOUNTER — Inpatient Hospital Stay: Admitting: Hematology & Oncology

## 2024-04-26 ENCOUNTER — Inpatient Hospital Stay: Attending: Hematology & Oncology

## 2024-04-26 ENCOUNTER — Ambulatory Visit: Payer: Self-pay | Admitting: Hematology & Oncology

## 2024-04-26 ENCOUNTER — Encounter: Payer: Self-pay | Admitting: Hematology & Oncology

## 2024-04-26 ENCOUNTER — Other Ambulatory Visit: Payer: Self-pay

## 2024-04-26 VITALS — BP 153/55 | HR 58 | Resp 17 | Ht 70.0 in | Wt 183.0 lb

## 2024-04-26 DIAGNOSIS — D500 Iron deficiency anemia secondary to blood loss (chronic): Secondary | ICD-10-CM

## 2024-04-26 DIAGNOSIS — C7A8 Other malignant neuroendocrine tumors: Secondary | ICD-10-CM | POA: Diagnosis not present

## 2024-04-26 DIAGNOSIS — C7951 Secondary malignant neoplasm of bone: Secondary | ICD-10-CM

## 2024-04-26 DIAGNOSIS — C7B8 Other secondary neuroendocrine tumors: Secondary | ICD-10-CM | POA: Diagnosis not present

## 2024-04-26 DIAGNOSIS — K769 Liver disease, unspecified: Secondary | ICD-10-CM

## 2024-04-26 DIAGNOSIS — D509 Iron deficiency anemia, unspecified: Secondary | ICD-10-CM | POA: Insufficient documentation

## 2024-04-26 LAB — CMP (CANCER CENTER ONLY)
ALT: 12 U/L (ref 0–44)
AST: 16 U/L (ref 15–41)
Albumin: 3.9 g/dL (ref 3.5–5.0)
Alkaline Phosphatase: 46 U/L (ref 38–126)
Anion gap: 5 (ref 5–15)
BUN: 19 mg/dL (ref 8–23)
CO2: 31 mmol/L (ref 22–32)
Calcium: 9.4 mg/dL (ref 8.9–10.3)
Chloride: 105 mmol/L (ref 98–111)
Creatinine: 1.16 mg/dL (ref 0.61–1.24)
GFR, Estimated: >60 mL/min (ref >60)
Glucose, Bld: 97 mg/dL (ref 70–99)
Potassium: 4.5 mmol/L (ref 3.5–5.1)
Sodium: 141 mmol/L (ref 135–145)
Total Bilirubin: 0.6 mg/dL (ref 0.0–1.2)
Total Protein: 6.7 g/dL (ref 6.5–8.1)

## 2024-04-26 LAB — CBC WITH DIFFERENTIAL (CANCER CENTER ONLY)
Abs Immature Granulocytes: 0.02 10*3/uL (ref 0.00–0.07)
Basophils Absolute: 0 10*3/uL (ref 0.0–0.1)
Basophils Relative: 1 %
Eosinophils Absolute: 0.3 10*3/uL (ref 0.0–0.5)
Eosinophils Relative: 6 %
HCT: 32.8 % — ABNORMAL LOW (ref 39.0–52.0)
Hemoglobin: 10.5 g/dL — ABNORMAL LOW (ref 13.0–17.0)
Immature Granulocytes: 0 %
Lymphocytes Relative: 9 %
Lymphs Abs: 0.5 10*3/uL — ABNORMAL LOW (ref 0.7–4.0)
MCH: 30.8 pg (ref 26.0–34.0)
MCHC: 32 g/dL (ref 30.0–36.0)
MCV: 96.2 fL (ref 80.0–100.0)
Monocytes Absolute: 0.4 10*3/uL (ref 0.1–1.0)
Monocytes Relative: 7 %
Neutro Abs: 4.2 10*3/uL (ref 1.7–7.7)
Neutrophils Relative %: 77 %
Platelet Count: 144 10*3/uL — ABNORMAL LOW (ref 150–400)
RBC: 3.41 MIL/uL — ABNORMAL LOW (ref 4.22–5.81)
RDW: 14.4 % (ref 11.5–15.5)
WBC Count: 5.5 10*3/uL (ref 4.0–10.5)
nRBC: 0 % (ref 0.0–0.2)

## 2024-04-26 LAB — IRON AND IRON BINDING CAPACITY (CC-WL,HP ONLY)
Iron: 56 ug/dL (ref 45–182)
Saturation Ratios: 17 % — ABNORMAL LOW (ref 17.9–39.5)
TIBC: 329 ug/dL (ref 250–450)
UIBC: 273 ug/dL (ref 117–376)

## 2024-04-26 LAB — LACTATE DEHYDROGENASE: LDH: 198 U/L — ABNORMAL HIGH (ref 98–192)

## 2024-04-26 LAB — FERRITIN: Ferritin: 113 ng/mL (ref 24–336)

## 2024-04-26 MED ORDER — LANREOTIDE ACETATE 120 MG/0.5ML ~~LOC~~ SOLN
120.0000 mg | Freq: Once | SUBCUTANEOUS | Status: AC
  Administered 2024-04-26: 120 mg via SUBCUTANEOUS

## 2024-04-26 NOTE — Progress Notes (Signed)
 Hematology and Oncology Follow Up Visit  GLENWOOD REVOIR [id number] Oct 27, 1946 78 y.o. 04/26/2024   Principle Diagnosis:  Metastatic low grade neuroendocrine tumor-hepatic metastases Iron  deficiency anemia   Past Diagnosis:  S/p yttrium-90 intrahepatic therapy - November 2016 Lutathera  (Lu 177) injection on 02/01/2018 - s/p cycle 4 SBRT to T6 lesion -- 11/06/2021   Current Therapy:        Somatuline 120 mg q 4 week-start on 08/01/2023  IV Iron  as indicated  --Venofer  given on 06/27/2023 Lutathera -retreatment -09/06/2023 -- s/p 2/2 cycles   Interim History:  Mr. Buelow is here today for follow-up.  He is managing pretty well.  He really has no complaints if we last saw him.  Unfortunately, he is losing his cardiologist as his cardiologist is retiring.  Regarding have to see about getting him a new cardiologist.  He has done well with the Somatuline.  His neuroendocrine carcinoma has been holding pretty steady.  His last Chromogranin A level was 160.  He had a dotatate PET scan that was done on 04/16/2024.  Everything looked fine without any evidence of progressive disease.  There was no change with respect to his metastatic bony disease.  He has had no problems with palpitations.  He has had no cough or shortness of breath.  He has had no change in bowel or bladder habits.  It sounds like in the late autumn, he will be going over to [place] for a little bit.  I am sure that he will have a wonderful time over there.  His last iron  studies that were done back in April showed a ferritin of 170 with iron  saturation of 19%.  Currently, I would say that his performance status is probably ECOG 1.  Medications:  Allergies as of 04/26/2024       Reactions   Iohexol  Hives    Code: HIVES, Desc: PER [name] @ [facility], PT IS ALLERGIC TO CONTRAST DYE 10/02/08/RM  05/01/10...needs full premeds per our protocol w/ [facility]., Onset Date: [id number]   Iodinated Contrast Media Hives         Medication List        Accurate as of April 26, 2024  1:42 PM. If you have any questions, ask your nurse or doctor.          Abrysvo 120 MCG/0.5ML injection Generic drug: RSV bivalent vaccine Inject 0.5 mLs into the muscle once.   aspirin  EC 81 MG tablet Take 81 mg by mouth every morning.   atorvastatin  80 MG tablet Commonly known as: LIPITOR TAKE 1 TABLET BY MOUTH EVERY DAY   Cholecalciferol  25 MCG (1000 UT) tablet Take 1,000 Units by mouth 2 (two) times daily.   clopidogrel  75 MG tablet Commonly known as: PLAVIX  TAKE 1 TABLET BY MOUTH EVERY DAY   cyanocobalamin  1000 MCG tablet Commonly known as: VITAMIN B12 Take 1,000 mcg by mouth every other day.   diphenhydramine -acetaminophen  25-500 MG Tabs tablet Commonly known as: TYLENOL  PM Take 1 tablet by mouth at bedtime as needed (sleep/pain).   fluticasone  50 MCG/ACT nasal spray Commonly known as: FLONASE  Place 2 sprays into both nostrils daily as needed (sinuses).   hydrocortisone  25 MG suppository Commonly known as: ANUSOL -HC INSERT 1 SUPPOSITORY RECTALLY TWICE A DAY AS NEEDED FOR HEMORRHOIDS   ipratropium 0.03 % nasal spray Commonly known as: ATROVENT  PLACE 2 SPRAYS INTO THE NOSE 3 (THREE) TIMES DAILY AS NEEDED FOR RHINITIS.   lansoprazole  30 MG capsule Commonly known as: PREVACID  TAKE 1 CAPSULE (  30 MG TOTAL) BY MOUTH DAILY AT 12 NOON.   levothyroxine  50 MCG tablet Commonly known as: SYNTHROID  TAKE 1 TABLET BY MOUTH DAILY BEFORE BREAKFAST   LORazepam  0.5 MG tablet Commonly known as: Ativan  1 tab po 30 minutes prior to radiation or MRI scans   losartan  100 MG tablet Commonly known as: COZAAR  Take 1 tablet (100 mg total) by mouth daily.   methocarbamol  500 MG tablet Commonly known as: ROBAXIN  Take 1 tablet (500 mg total) by mouth every 8 (eight) hours as needed for muscle spasms.   metoprolol  tartrate 25 MG tablet Commonly known as: LOPRESSOR  TAKE 1/2 TABLET TWICE A DAY BY MOUTH   nitroGLYCERIN   0.4 MG SL tablet Commonly known as: NITROSTAT  PLACE 1 TABLET UNDER THE TONGUE EVERY 5 (FIVE) MINUTES X 3 DOSES AS NEEDED FOR CHEST PAIN.   ondansetron  8 MG tablet Commonly known as: ZOFRAN  Take 1 tablet (8 mg total) by mouth 2 (two) times daily as needed for nausea or vomiting.   ranolazine  500 MG 12 hr tablet Commonly known as: RANEXA  Take 1 tablet by mouth twice daily   SOMATULINE DEPOT  [place] Inject 120 mcg into the skin every 28 (twenty-eight) days. Receives at Dr Jessy office   SOOTHE XP OP Place 1 drop into both eyes daily as needed (dry eyes).        Allergies:  Allergies  Allergen Reactions   Iohexol  Hives     Code: HIVES, Desc: PER [name] @ [facility], PT IS ALLERGIC TO CONTRAST DYE 10/02/08/RM  05/01/10...needs full premeds per our protocol w/ [facility]., Onset Date: 87977990    Iodinated Contrast Media Hives    Past Medical History, Surgical history, Social history, and Family History were reviewed and updated.  Review of Systems: All other 10 point review of systems is negative.   Physical Exam:  height is 5' 10 (1.778 m) and weight is 183 lb (83 kg). His blood pressure is 153/55 (abnormal) and his pulse is 58 (abnormal). His respiration is 17.   Wt Readings from Last 3 Encounters:  04/26/24 183 lb (83 kg)  03/28/24 184 lb (83.5 kg)  03/28/24 181 lb 1.9 oz (82.2 kg)    Physical Exam Vitals reviewed.  HENT:     Head: Normocephalic and atraumatic.   Eyes:     Pupils: Pupils are equal, round, and reactive to light.    Cardiovascular:     Rate and Rhythm: Normal rate and regular rhythm.     Heart sounds: Normal heart sounds.  Pulmonary:     Effort: Pulmonary effort is normal.     Breath sounds: Normal breath sounds.  Abdominal:     General: Bowel sounds are normal.     Palpations: Abdomen is soft.   Musculoskeletal:        General: No tenderness or deformity. Normal range of motion.     Cervical back: Normal range of motion.  Lymphadenopathy:      Cervical: No cervical adenopathy.   Skin:    General: Skin is warm and dry.     Findings: No erythema or rash.   Neurological:     Mental Status: He is alert and oriented to person, place, and time.   Psychiatric:        Behavior: Behavior normal.        Thought Content: Thought content normal.        Judgment: Judgment normal.      Lab Results  Component Value Date   WBC  5.5 04/26/2024   HGB 10.5 (L) 04/26/2024   HCT 32.8 (L) 04/26/2024   MCV 96.2 04/26/2024   PLT 144 (L) 04/26/2024   Lab Results  Component Value Date   FERRITIN 170 02/27/2024   IRON  58 02/27/2024   TIBC 304 02/27/2024   UIBC 246 02/27/2024   IRONPCTSAT 19 02/27/2024   Lab Results  Component Value Date   RETICCTPCT 1.4 02/27/2024   RBC 3.41 (L) 04/26/2024   No results found for: KPAFRELGTCHN, LAMBDASER, KAPLAMBRATIO No results found for: IGGSERUM, IGA, IGMSERUM No results found for: STEPHANY CARLOTA BENSON MARKEL EARLA JOANNIE DOC VICK, SPEI   Chemistry      Component Value Date/Time   NA 141 04/26/2024 1016   NA 141 06/22/2022 0942   NA 144 10/04/2017 0904   NA 141 07/23/2016 1255   K 4.5 04/26/2024 1016   K 3.8 10/04/2017 0904   K 4.1 07/23/2016 1255   CL 105 04/26/2024 1016   CL 103 10/04/2017 0904   CO2 31 04/26/2024 1016   CO2 28 10/04/2017 0904   CO2 27 07/23/2016 1255   BUN 19 04/26/2024 1016   BUN 20 06/22/2022 0942   BUN 14 10/04/2017 0904   BUN 13.8 07/23/2016 1255   CREATININE 1.16 04/26/2024 1016   CREATININE 0.8 10/04/2017 0904   CREATININE 1.0 07/23/2016 1255      Component Value Date/Time   CALCIUM  9.4 04/26/2024 1016   CALCIUM  9.4 10/04/2017 0904   CALCIUM  9.2 07/23/2016 1255   ALKPHOS 46 04/26/2024 1016   ALKPHOS 64 10/04/2017 0904   ALKPHOS 77 07/23/2016 1255   AST 16 04/26/2024 1016   AST 23 07/23/2016 1255   ALT 12 04/26/2024 1016   ALT 22 10/04/2017 0904   ALT 13 07/23/2016 1255   BILITOT 0.6 04/26/2024  1016   BILITOT 1.13 07/23/2016 1255       Impression and Plan: Mr. Binion is a very pleasant 78 yo caucasian gentleman with metastatic low-grade neuroendocrine carcinoma with hepatic metastasis and bone metastasis.    He will continue on his Somatuline.  I think this is helping him.  He is not symptomatic with his metastatic disease.  We will have to see what his Chromogranin A level is.  Again, we will have to find him a new Cardiologist.  I have noted that his anemia is more of a problem.  His iron  studies have been okay.  I would like to get a erythropoietin level on him.  We will see how that looks.  Is possible he may need to have some ESA to get his hemoglobin up a little bit better.  Will plan to get him back in another month. SABRA Maude JONELLE Timmy, MD 6/26/20251:42 PM

## 2024-04-26 NOTE — Patient Instructions (Signed)
 Lanreotide Injection What is this medication? LANREOTIDE (lan REE oh tide) treats high levels of growth hormone (acromegaly). It is used when other therapies have not worked well enough or cannot be tolerated. It works by reducing the amount of growth hormone your body makes. This reduces symptoms and the risk of health problems caused by too much growth hormone, such as diabetes and heart disease. It may also be used to treat neuroendocrine tumors, a cancer of the cells that release hormones and other substances in your body. It works by slowing down the release of these substances from the cells. This slows tumor growth. It also decreases the symptoms of carcinoid syndrome, such as flushing or diarrhea. This medicine may be used for other purposes; ask your health care provider or pharmacist if you have questions. COMMON BRAND NAME(S): Somatuline [place] What should I tell my care team before I take this medication? They need to know if you have any of these conditions: Diabetes Gallbladder disease Heart disease Kidney disease Liver disease Pancreatic disease Thyroid disease An unusual or allergic reaction to lanreotide, other medications, foods, dyes, or preservatives Pregnant or trying to get pregnant Breastfeeding How should I use this medication? This medication is injected under the skin. It is given by your care team in a hospital or clinic setting. Talk to your care team about the use of this medication in children. Special care may be needed. Overdosage: If you think you have taken too much of this medicine contact a poison control center or emergency room at once. NOTE: This medicine is only for you. Do not share this medicine with others. What if I miss a dose? Keep appointments for follow-up doses. It is important not to miss your dose. Call your care team if you are unable to keep an appointment. What may interact with this medication? Bromocriptine Cyclosporine Certain  medications for blood pressure, heart disease, irregular heartbeat Certain medications for diabetes Quinidine Terfenadine This list may not describe all possible interactions. Give your health care provider a list of all the medicines, herbs, non-prescription drugs, or dietary supplements you use. Also tell them if you smoke, drink alcohol, or use illegal drugs. Some items may interact with your medicine. What should I watch for while using this medication? Visit your care team for regular checks on your progress. Tell your care team if your symptoms do not start to get better or if they get worse. Your condition will be monitored carefully while you are receiving this medication. You may need blood work while you are taking this medication. This medication may increase blood sugar. The risk may be higher in patients who already have diabetes. Ask your care team what you can do to lower your risk of diabetes while taking this medication. Talk to your care team if you wish to become pregnant or think you may be pregnant. This medication can cause serious birth defects. Do not breast-feed while taking this medication and for 6 months after stopping therapy. This medication may cause infertility. Talk to your care team if you are concerned about your fertility. What side effects may I notice from receiving this medication? Side effects that you should report to your care team as soon as possible: Allergic reactions--skin rash, itching, hives, swelling of the face, lips, tongue, or throat Gallbladder problems--severe stomach pain, nausea, vomiting, fever High blood sugar (hyperglycemia)--increased thirst or amount of urine, unusual weakness or fatigue, blurry vision Increase in blood pressure Low blood sugar (hypoglycemia)--pale, blue or purple  skin or lips, sweating, fussiness, rapid heartbeat, poor feeding, low body temperature Low thyroid levels (hypothyroidism)--unusual weakness or fatigue,  increased sensitivity to cold, constipation, hair loss, dry skin, weight gain, feelings of depression Oily or light-colored stools, diarrhea, bloating, weight loss Slow heartbeat--dizziness, feeling faint or lightheaded, confusion, trouble breathing, unusual weakness or fatigue Side effects that usually do not require medical attention (report these to your care team if they continue or are bothersome): Diarrhea Dizziness Headache Muscle spasms Nausea Pain, redness, or irritation at injection site Stomach pain This list may not describe all possible side effects. Call your doctor for medical advice about side effects. You may report side effects to FDA at 1-800-FDA-1088. Where should I keep my medication? This medication is given in a hospital or clinic. It will not be stored at home. NOTE: This sheet is a summary. It may not cover all possible information. If you have questions about this medicine, talk to your doctor, pharmacist, or health care provider.  2024 Elsevier/Gold Standard (2023-09-30 00:00:00)

## 2024-04-27 LAB — ERYTHROPOIETIN: Erythropoietin: 33.8 m[IU]/mL — ABNORMAL HIGH (ref 2.6–18.5)

## 2024-04-28 LAB — CHROMOGRANIN A: Chromogranin A (ng/mL): 145.2 ng/mL — ABNORMAL HIGH (ref 0.0–101.8)

## 2024-05-17 ENCOUNTER — Inpatient Hospital Stay: Attending: Hematology & Oncology

## 2024-05-17 VITALS — BP 136/52 | HR 55 | Temp 97.8°F | Resp 20

## 2024-05-17 DIAGNOSIS — D509 Iron deficiency anemia, unspecified: Secondary | ICD-10-CM | POA: Diagnosis not present

## 2024-05-17 DIAGNOSIS — D500 Iron deficiency anemia secondary to blood loss (chronic): Secondary | ICD-10-CM

## 2024-05-17 DIAGNOSIS — C7B8 Other secondary neuroendocrine tumors: Secondary | ICD-10-CM | POA: Diagnosis not present

## 2024-05-17 DIAGNOSIS — C7A8 Other malignant neuroendocrine tumors: Secondary | ICD-10-CM | POA: Diagnosis not present

## 2024-05-17 MED ORDER — SODIUM CHLORIDE 0.9 % IV SOLN: Freq: Once | INTRAVENOUS | Status: AC

## 2024-05-17 MED ORDER — SODIUM CHLORIDE 0.9 % IV SOLN
300.0000 mg | Freq: Once | INTRAVENOUS | Status: AC
  Administered 2024-05-17: 300 mg via INTRAVENOUS
  Filled 2024-05-17: qty 300

## 2024-05-17 NOTE — Patient Instructions (Signed)

## 2024-05-24 ENCOUNTER — Encounter: Payer: Self-pay | Admitting: Hematology & Oncology

## 2024-05-24 ENCOUNTER — Inpatient Hospital Stay: Admitting: Hematology & Oncology

## 2024-05-24 ENCOUNTER — Inpatient Hospital Stay

## 2024-05-24 ENCOUNTER — Encounter: Payer: Self-pay | Admitting: Family

## 2024-05-24 VITALS — BP 164/60 | HR 56

## 2024-05-24 VITALS — BP 166/62 | HR 62 | Temp 98.1°F | Resp 18 | Wt 184.1 lb

## 2024-05-24 DIAGNOSIS — D500 Iron deficiency anemia secondary to blood loss (chronic): Secondary | ICD-10-CM | POA: Diagnosis not present

## 2024-05-24 DIAGNOSIS — K909 Intestinal malabsorption, unspecified: Secondary | ICD-10-CM

## 2024-05-24 DIAGNOSIS — C7B8 Other secondary neuroendocrine tumors: Secondary | ICD-10-CM

## 2024-05-24 DIAGNOSIS — D509 Iron deficiency anemia, unspecified: Secondary | ICD-10-CM | POA: Diagnosis not present

## 2024-05-24 DIAGNOSIS — C7A8 Other malignant neuroendocrine tumors: Secondary | ICD-10-CM | POA: Diagnosis not present

## 2024-05-24 LAB — IRON AND IRON BINDING CAPACITY (CC-WL,HP ONLY)
Iron: 53 ug/dL (ref 45–182)
Saturation Ratios: 16 % — ABNORMAL LOW (ref 17.9–39.5)
TIBC: 328 ug/dL (ref 250–450)
UIBC: 275 ug/dL

## 2024-05-24 LAB — CBC WITH DIFFERENTIAL (CANCER CENTER ONLY)
Abs Immature Granulocytes: 0.06 K/uL (ref 0.00–0.07)
Basophils Absolute: 0 K/uL (ref 0.0–0.1)
Basophils Relative: 1 %
Eosinophils Absolute: 0.3 K/uL (ref 0.0–0.5)
Eosinophils Relative: 5 %
HCT: 31.8 % — ABNORMAL LOW (ref 39.0–52.0)
Hemoglobin: 10.1 g/dL — ABNORMAL LOW (ref 13.0–17.0)
Immature Granulocytes: 1 %
Lymphocytes Relative: 10 %
Lymphs Abs: 0.5 K/uL — ABNORMAL LOW (ref 0.7–4.0)
MCH: 30.2 pg (ref 26.0–34.0)
MCHC: 31.8 g/dL (ref 30.0–36.0)
MCV: 95.2 fL (ref 80.0–100.0)
Monocytes Absolute: 0.4 K/uL (ref 0.1–1.0)
Monocytes Relative: 9 %
Neutro Abs: 3.8 K/uL (ref 1.7–7.7)
Neutrophils Relative %: 74 %
Platelet Count: 156 K/uL (ref 150–400)
RBC: 3.34 MIL/uL — ABNORMAL LOW (ref 4.22–5.81)
RDW: 15.3 % (ref 11.5–15.5)
WBC Count: 5.1 K/uL (ref 4.0–10.5)
nRBC: 0 % (ref 0.0–0.2)

## 2024-05-24 LAB — CMP (CANCER CENTER ONLY)
ALT: 16 U/L (ref 0–44)
AST: 29 U/L (ref 15–41)
Albumin: 3.9 g/dL (ref 3.5–5.0)
Alkaline Phosphatase: 60 U/L (ref 38–126)
Anion gap: 10 (ref 5–15)
BUN: 17 mg/dL (ref 8–23)
CO2: 26 mmol/L (ref 22–32)
Calcium: 9.2 mg/dL (ref 8.9–10.3)
Chloride: 104 mmol/L (ref 98–111)
Creatinine: 1.2 mg/dL (ref 0.61–1.24)
GFR, Estimated: >60 mL/min (ref >60)
Glucose, Bld: 90 mg/dL (ref 70–99)
Potassium: 4.5 mmol/L (ref 3.5–5.1)
Sodium: 140 mmol/L (ref 135–145)
Total Bilirubin: 0.6 mg/dL (ref 0.0–1.2)
Total Protein: 7 g/dL (ref 6.5–8.1)

## 2024-05-24 LAB — FERRITIN: Ferritin: 342 ng/mL — ABNORMAL HIGH (ref 24–336)

## 2024-05-24 MED ORDER — LANREOTIDE ACETATE 120 MG/0.5ML ~~LOC~~ SOLN
120.0000 mg | SUBCUTANEOUS | Status: DC
  Administered 2024-05-24: 120 mg via SUBCUTANEOUS
  Filled 2024-05-24: qty 120

## 2024-05-24 MED ORDER — SODIUM CHLORIDE 0.9 % IV SOLN: Freq: Once | INTRAVENOUS | Status: AC

## 2024-05-24 MED ORDER — SODIUM CHLORIDE 0.9 % IV SOLN
300.0000 mg | Freq: Once | INTRAVENOUS | Status: AC
  Administered 2024-05-24: 300 mg via INTRAVENOUS
  Filled 2024-05-24: qty 300

## 2024-05-24 NOTE — Patient Instructions (Signed)
 Lanreotide Injection What is this medication? LANREOTIDE (lan REE oh tide) treats high levels of growth hormone (acromegaly). It is used when other therapies have not worked well enough or cannot be tolerated. It works by reducing the amount of growth hormone your body makes. This reduces symptoms and the risk of health problems caused by too much growth hormone, such as diabetes and heart disease. It may also be used to treat neuroendocrine tumors, a cancer of the cells that release hormones and other substances in your body. It works by slowing down the release of these substances from the cells. This slows tumor growth. It also decreases the symptoms of carcinoid syndrome, such as flushing or diarrhea. This medicine may be used for other purposes; ask your health care provider or pharmacist if you have questions. COMMON BRAND NAME(S): Somatuline [place] What should I tell my care team before I take this medication? They need to know if you have any of these conditions: Diabetes Gallbladder disease Heart disease Kidney disease Liver disease Pancreatic disease Thyroid disease An unusual or allergic reaction to lanreotide, other medications, foods, dyes, or preservatives Pregnant or trying to get pregnant Breastfeeding How should I use this medication? This medication is injected under the skin. It is given by your care team in a hospital or clinic setting. Talk to your care team about the use of this medication in children. Special care may be needed. Overdosage: If you think you have taken too much of this medicine contact a poison control center or emergency room at once. NOTE: This medicine is only for you. Do not share this medicine with others. What if I miss a dose? Keep appointments for follow-up doses. It is important not to miss your dose. Call your care team if you are unable to keep an appointment. What may interact with this medication? Bromocriptine Cyclosporine Certain  medications for blood pressure, heart disease, irregular heartbeat Certain medications for diabetes Quinidine Terfenadine This list may not describe all possible interactions. Give your health care provider a list of all the medicines, herbs, non-prescription drugs, or dietary supplements you use. Also tell them if you smoke, drink alcohol, or use illegal drugs. Some items may interact with your medicine. What should I watch for while using this medication? Visit your care team for regular checks on your progress. Tell your care team if your symptoms do not start to get better or if they get worse. Your condition will be monitored carefully while you are receiving this medication. You may need blood work while you are taking this medication. This medication may increase blood sugar. The risk may be higher in patients who already have diabetes. Ask your care team what you can do to lower your risk of diabetes while taking this medication. Talk to your care team if you wish to become pregnant or think you may be pregnant. This medication can cause serious birth defects. Do not breast-feed while taking this medication and for 6 months after stopping therapy. This medication may cause infertility. Talk to your care team if you are concerned about your fertility. What side effects may I notice from receiving this medication? Side effects that you should report to your care team as soon as possible: Allergic reactions--skin rash, itching, hives, swelling of the face, lips, tongue, or throat Gallbladder problems--severe stomach pain, nausea, vomiting, fever High blood sugar (hyperglycemia)--increased thirst or amount of urine, unusual weakness or fatigue, blurry vision Increase in blood pressure Low blood sugar (hypoglycemia)--pale, blue or purple  skin or lips, sweating, fussiness, rapid heartbeat, poor feeding, low body temperature Low thyroid levels (hypothyroidism)--unusual weakness or fatigue,  increased sensitivity to cold, constipation, hair loss, dry skin, weight gain, feelings of depression Oily or light-colored stools, diarrhea, bloating, weight loss Slow heartbeat--dizziness, feeling faint or lightheaded, confusion, trouble breathing, unusual weakness or fatigue Side effects that usually do not require medical attention (report these to your care team if they continue or are bothersome): Diarrhea Dizziness Headache Muscle spasms Nausea Pain, redness, or irritation at injection site Stomach pain This list may not describe all possible side effects. Call your doctor for medical advice about side effects. You may report side effects to FDA at 1-800-FDA-1088. Where should I keep my medication? This medication is given in a hospital or clinic. It will not be stored at home. NOTE: This sheet is a summary. It may not cover all possible information. If you have questions about this medicine, talk to your doctor, pharmacist, or health care provider.  2024 Elsevier/Gold Standard (2023-09-30 00:00:00)

## 2024-05-24 NOTE — Progress Notes (Signed)
 Hematology and Oncology Follow Up Visit  MELBURN TREIBER [id number] 1946/08/01 78 y.o. 05/24/2024   Principle Diagnosis:  Metastatic low grade neuroendocrine tumor-hepatic metastases Iron  deficiency anemia Erythropoietin  deficient anemia   Past Diagnosis:  S/p yttrium-90 intrahepatic therapy - November 2016 Lutathera  (Lu 177) injection on 02/01/2018 - s/p cycle 4 SBRT to T6 lesion -- 11/06/2021   Current Therapy:        Somatuline 120 mg q 4 week-start on 08/01/2023  IV Iron  as indicated  --Venofer  given on 06/27/2023 Lutathera -retreatment -09/06/2023 -- s/p 2/2 cycles Aranesp 300 mcg SQ q. 3 weeks for hemoglobin less than 11   Interim History:  Mr. Groot is here today for follow-up.  He does feel tired.  His iron  level is on the low side.  His last iron  saturation was only 17%.  His erythropoietin  level is only 33.  As such, I think we are going to have to see about giving him some ESA.  I do not do not see a problem with him taking this.  I know his blood pressure is on the high side.  Hopefully, cardiology will be able to help with his blood pressure.   He has had a good appetite.  He has had no problems with cough or shortness of breath.  He has had no problems with fever.  He has had no rashes.  There is been no leg swelling.  His last Chromogranin A level was 145.  His last dotatate PET scan was back in mid June.  At that time, everything looked pretty stable.  Currently, I would have to say that his performance status is probably ECOG 1.  Medications:  Allergies as of 05/24/2024       Reactions   Iohexol  Hives    Code: HIVES, Desc: PER [name] @ [facility], PT IS ALLERGIC TO CONTRAST DYE 10/02/08/RM  05/01/10...needs full premeds per our protocol w/ [facility]., Onset Date: 87977990   Iodinated Contrast Media Hives        Medication List        Accurate as of May 24, 2024 10:41 AM. If you have any questions, ask your nurse or doctor.          Abrysvo 120  MCG/0.5ML injection Generic drug: RSV bivalent vaccine Inject 0.5 mLs into the muscle once.   aspirin  EC 81 MG tablet Take 81 mg by mouth every morning.   atorvastatin  80 MG tablet Commonly known as: LIPITOR TAKE 1 TABLET BY MOUTH EVERY DAY   Cholecalciferol  25 MCG (1000 UT) tablet Take 1,000 Units by mouth 2 (two) times daily.   clopidogrel  75 MG tablet Commonly known as: PLAVIX  TAKE 1 TABLET BY MOUTH EVERY DAY   cyanocobalamin  1000 MCG tablet Commonly known as: VITAMIN B12 Take 1,000 mcg by mouth every other day.   diphenhydramine -acetaminophen  25-500 MG Tabs tablet Commonly known as: TYLENOL  PM Take 1 tablet by mouth at bedtime as needed (sleep/pain).   fluticasone  50 MCG/ACT nasal spray Commonly known as: FLONASE  Place 2 sprays into both nostrils daily as needed (sinuses).   hydrocortisone  25 MG suppository Commonly known as: ANUSOL -HC INSERT 1 SUPPOSITORY RECTALLY TWICE A DAY AS NEEDED FOR HEMORRHOIDS   ipratropium 0.03 % nasal spray Commonly known as: ATROVENT  PLACE 2 SPRAYS INTO THE NOSE 3 (THREE) TIMES DAILY AS NEEDED FOR RHINITIS.   lansoprazole  30 MG capsule Commonly known as: PREVACID  TAKE 1 CAPSULE (30 MG TOTAL) BY MOUTH DAILY AT 12 NOON.   levothyroxine  50 MCG tablet  Commonly known as: SYNTHROID  TAKE 1 TABLET BY MOUTH DAILY BEFORE BREAKFAST   LORazepam  0.5 MG tablet Commonly known as: Ativan  1 tab po 30 minutes prior to radiation or MRI scans   losartan  100 MG tablet Commonly known as: COZAAR  Take 1 tablet (100 mg total) by mouth daily.   methocarbamol  500 MG tablet Commonly known as: ROBAXIN  Take 1 tablet (500 mg total) by mouth every 8 (eight) hours as needed for muscle spasms.   metoprolol  tartrate 25 MG tablet Commonly known as: LOPRESSOR  TAKE 1/2 TABLET TWICE A DAY BY MOUTH   nitroGLYCERIN  0.4 MG SL tablet Commonly known as: NITROSTAT  PLACE 1 TABLET UNDER THE TONGUE EVERY 5 (FIVE) MINUTES X 3 DOSES AS NEEDED FOR CHEST PAIN.    ondansetron  8 MG tablet Commonly known as: ZOFRAN  Take 1 tablet (8 mg total) by mouth 2 (two) times daily as needed for nausea or vomiting.   ranolazine  500 MG 12 hr tablet Commonly known as: RANEXA  Take 1 tablet by mouth twice daily   SOMATULINE DEPOT  [place] Inject 120 mcg into the skin every 28 (twenty-eight) days. Receives at Dr Jessy office   SOOTHE XP OP Place 1 drop into both eyes daily as needed (dry eyes).        Allergies:  Allergies  Allergen Reactions  . Iohexol  Hives     Code: HIVES, Desc: PER [name] @ [facility], PT IS ALLERGIC TO CONTRAST DYE 10/02/08/RM  05/01/10...needs full premeds per our protocol w/ [facility]., Onset Date: 87977990   . Iodinated Contrast Media Hives    Past Medical History, Surgical history, Social history, and Family History were reviewed and updated.  Review of Systems:  Review of Systems  Constitutional:  Positive for malaise/fatigue.  HENT: Negative.    Eyes: Negative.   Respiratory: Negative.    Cardiovascular: Negative.   Gastrointestinal: Negative.   Genitourinary: Negative.   Musculoskeletal: Negative.   Skin: Negative.   Neurological: Negative.   Endo/Heme/Allergies: Negative.   Psychiatric/Behavioral: Negative.       Physical Exam:  weight is 184 lb 1.9 oz (83.5 kg). His oral temperature is 98.1 F (36.7 C). His blood pressure is 166/62 (abnormal) and his pulse is 62. His respiration is 18 and oxygen saturation is 100%.   Wt Readings from Last 3 Encounters:  05/24/24 184 lb 1.9 oz (83.5 kg)  04/26/24 183 lb (83 kg)  03/28/24 184 lb (83.5 kg)    Physical Exam Vitals reviewed.  HENT:     Head: Normocephalic and atraumatic.  Eyes:     Pupils: Pupils are equal, round, and reactive to light.  Cardiovascular:     Rate and Rhythm: Normal rate and regular rhythm.     Heart sounds: Normal heart sounds.  Pulmonary:     Effort: Pulmonary effort is normal.     Breath sounds: Normal breath sounds.  Abdominal:      General: Bowel sounds are normal.     Palpations: Abdomen is soft.  Musculoskeletal:        General: No tenderness or deformity. Normal range of motion.     Cervical back: Normal range of motion.  Lymphadenopathy:     Cervical: No cervical adenopathy.  Skin:    General: Skin is warm and dry.     Findings: No erythema or rash.  Neurological:     Mental Status: He is alert and oriented to person, place, and time.  Psychiatric:        Behavior: Behavior normal.  Thought Content: Thought content normal.        Judgment: Judgment normal.      Lab Results  Component Value Date   WBC 5.1 05/24/2024   HGB 10.1 (L) 05/24/2024   HCT 31.8 (L) 05/24/2024   MCV 95.2 05/24/2024   PLT 156 05/24/2024   Lab Results  Component Value Date   FERRITIN 113 04/26/2024   IRON  56 04/26/2024   TIBC 329 04/26/2024   UIBC 273 04/26/2024   IRONPCTSAT 17 (L) 04/26/2024   Lab Results  Component Value Date   RETICCTPCT 1.4 02/27/2024   RBC 3.34 (L) 05/24/2024   No results found for: KPAFRELGTCHN, LAMBDASER, KAPLAMBRATIO No results found for: IGGSERUM, IGA, IGMSERUM No results found for: STEPHANY CARLOTA BENSON MARKEL EARLA JOANNIE DOC VICK, SPEI   Chemistry      Component Value Date/Time   NA 140 05/24/2024 0933   NA 141 06/22/2022 0942   NA 144 10/04/2017 0904   NA 141 07/23/2016 1255   K 4.5 05/24/2024 0933   K 3.8 10/04/2017 0904   K 4.1 07/23/2016 1255   CL 104 05/24/2024 0933   CL 103 10/04/2017 0904   CO2 26 05/24/2024 0933   CO2 28 10/04/2017 0904   CO2 27 07/23/2016 1255   BUN 17 05/24/2024 0933   BUN 20 06/22/2022 0942   BUN 14 10/04/2017 0904   BUN 13.8 07/23/2016 1255   CREATININE 1.20 05/24/2024 0933   CREATININE 0.8 10/04/2017 0904   CREATININE 1.0 07/23/2016 1255      Component Value Date/Time   CALCIUM  9.2 05/24/2024 0933   CALCIUM  9.4 10/04/2017 0904   CALCIUM  9.2 07/23/2016 1255   ALKPHOS 60 05/24/2024 0933    ALKPHOS 64 10/04/2017 0904   ALKPHOS 77 07/23/2016 1255   AST 29 05/24/2024 0933   AST 23 07/23/2016 1255   ALT 16 05/24/2024 0933   ALT 22 10/04/2017 0904   ALT 13 07/23/2016 1255   BILITOT 0.6 05/24/2024 0933   BILITOT 1.13 07/23/2016 1255       Impression and Plan: Mr. Toren is a very pleasant 78 yo caucasian gentleman with metastatic low-grade neuroendocrine carcinoma with hepatic metastasis and bone metastasis.    He will continue on his Somatuline.  I think this is helping him.  He is not symptomatic with his metastatic disease.  We will have to see what his Chromogranin A level is.  He will get iron  today.  We will have to get him set up with Aranesp.  Again I think he is going need to have something to try to help improve his hemoglobin.  We will have to try to get the Aranesp set up so that we can get that started maybe in a week or so.  We will plan to get her back in another month.  Maude JONELLE Crease, MD 7/24/202510:41 AM

## 2024-05-24 NOTE — Telephone Encounter (Signed)
 Per Dr Timmy: His iron  is low again. Please have him come in for another dose of IV iron .   Spoke with Dr Timmy and advised him that pt received iron  today while in the office. Dr Timmy ok for pt to hold off on anymore iron  at this time.

## 2024-05-24 NOTE — Patient Instructions (Signed)

## 2024-05-24 NOTE — Telephone Encounter (Signed)
-----   Message from Maude JONELLE Crease sent at 05/24/2024  3:37 PM EDT ----- His iron  is low again.  Please have him come in for another dose of IV iron .  Thanks.SABRA ----- Message ----- From: Rebecka, Lab [facility] [place] Sent: 05/24/2024   9:58 AM EDT To: Maude JONELLE Crease, MD

## 2024-05-25 LAB — CHROMOGRANIN A: Chromogranin A (ng/mL): 216.7 ng/mL — ABNORMAL HIGH (ref 0.0–101.8)

## 2024-05-31 ENCOUNTER — Other Ambulatory Visit: Payer: Self-pay | Admitting: *Deleted

## 2024-05-31 ENCOUNTER — Inpatient Hospital Stay

## 2024-05-31 DIAGNOSIS — D500 Iron deficiency anemia secondary to blood loss (chronic): Secondary | ICD-10-CM

## 2024-05-31 DIAGNOSIS — C7B8 Other secondary neuroendocrine tumors: Secondary | ICD-10-CM | POA: Diagnosis not present

## 2024-05-31 DIAGNOSIS — C7A8 Other malignant neuroendocrine tumors: Secondary | ICD-10-CM | POA: Diagnosis not present

## 2024-05-31 DIAGNOSIS — D509 Iron deficiency anemia, unspecified: Secondary | ICD-10-CM | POA: Diagnosis not present

## 2024-05-31 LAB — CBC WITH DIFFERENTIAL (CANCER CENTER ONLY)
Abs Immature Granulocytes: 0.02 K/uL (ref 0.00–0.07)
Basophils Absolute: 0 K/uL (ref 0.0–0.1)
Basophils Relative: 1 %
Eosinophils Absolute: 0.3 K/uL (ref 0.0–0.5)
Eosinophils Relative: 6 %
HCT: 32.3 % — ABNORMAL LOW (ref 39.0–52.0)
Hemoglobin: 10.3 g/dL — ABNORMAL LOW (ref 13.0–17.0)
Immature Granulocytes: 0 %
Lymphocytes Relative: 12 %
Lymphs Abs: 0.7 K/uL (ref 0.7–4.0)
MCH: 30.6 pg (ref 26.0–34.0)
MCHC: 31.9 g/dL (ref 30.0–36.0)
MCV: 95.8 fL (ref 80.0–100.0)
Monocytes Absolute: 0.4 K/uL (ref 0.1–1.0)
Monocytes Relative: 7 %
Neutro Abs: 4.4 K/uL (ref 1.7–7.7)
Neutrophils Relative %: 74 %
Platelet Count: 137 K/uL — ABNORMAL LOW (ref 150–400)
RBC: 3.37 MIL/uL — ABNORMAL LOW (ref 4.22–5.81)
RDW: 15.2 % (ref 11.5–15.5)
WBC Count: 5.9 K/uL (ref 4.0–10.5)
nRBC: 0 % (ref 0.0–0.2)

## 2024-05-31 NOTE — Progress Notes (Signed)
 Pt entered clinic today for labs and possible injection.  Hemoglobin noted at 10.3. Discussed results with Leotis Repress,  Leotis stated that insurance carrier would not cover cost of Aranesp. Copy of labs given to patient. Explained finds to patient, pt verbalized understanding of regulations. Pt instructed to call our office, should issues occur. Pt understanding of said instructions.

## 2024-06-11 ENCOUNTER — Other Ambulatory Visit: Payer: Self-pay | Admitting: Family Medicine

## 2024-06-11 DIAGNOSIS — J3489 Other specified disorders of nose and nasal sinuses: Secondary | ICD-10-CM

## 2024-06-11 MED ORDER — IPRATROPIUM BROMIDE 0.03 % NA SOLN: 2.0000 | Freq: Three times a day (TID) | NASAL | 6 refills | Status: AC | PRN

## 2024-06-13 ENCOUNTER — Encounter

## 2024-06-14 ENCOUNTER — Encounter

## 2024-06-18 ENCOUNTER — Telehealth: Payer: Self-pay | Admitting: Cardiovascular Disease

## 2024-06-18 ENCOUNTER — Other Ambulatory Visit: Payer: Self-pay | Admitting: Internal Medicine

## 2024-06-18 MED ORDER — NITROGLYCERIN 0.4 MG SL SUBL
SUBLINGUAL_TABLET | SUBLINGUAL | 0 refills | Status: AC
Start: 2024-06-18 — End: ?

## 2024-06-18 NOTE — Telephone Encounter (Signed)
 Refill sent to [facility].

## 2024-06-18 NOTE — Telephone Encounter (Signed)
*  STAT* If patient is at the pharmacy, call can be transferred to refill team.   1. Which medications need to be refilled? (please list name of each medication and dose if known)   nitroGLYCERIN  (NITROSTAT ) 0.4 MG SL tablet   2. Would you like to learn more about the convenience, safety, & potential cost savings by using the [facility]?   3. Are you open to using the [facility] (Type [facility]. ).  4. Which pharmacy/location (including street and city if local pharmacy) is medication to be sent to?  [id number] - [place], [place] - [address]   5. Do they need a 30 day or 90 day supply?   Patient stated he lost his medication and is completely out.

## 2024-06-21 ENCOUNTER — Inpatient Hospital Stay: Attending: Hematology & Oncology

## 2024-06-21 ENCOUNTER — Inpatient Hospital Stay

## 2024-06-21 ENCOUNTER — Inpatient Hospital Stay (HOSPITAL_BASED_OUTPATIENT_CLINIC_OR_DEPARTMENT_OTHER): Admitting: Hematology & Oncology

## 2024-06-21 ENCOUNTER — Encounter: Payer: Self-pay | Admitting: Hematology & Oncology

## 2024-06-21 ENCOUNTER — Other Ambulatory Visit: Payer: Self-pay

## 2024-06-21 VITALS — BP 170/67 | HR 65

## 2024-06-21 VITALS — BP 155/57 | HR 64 | Temp 98.0°F | Resp 16 | Ht 70.0 in | Wt 182.0 lb

## 2024-06-21 DIAGNOSIS — D500 Iron deficiency anemia secondary to blood loss (chronic): Secondary | ICD-10-CM

## 2024-06-21 DIAGNOSIS — C7A8 Other malignant neuroendocrine tumors: Secondary | ICD-10-CM | POA: Insufficient documentation

## 2024-06-21 DIAGNOSIS — C7B8 Other secondary neuroendocrine tumors: Secondary | ICD-10-CM

## 2024-06-21 DIAGNOSIS — D509 Iron deficiency anemia, unspecified: Secondary | ICD-10-CM | POA: Insufficient documentation

## 2024-06-21 DIAGNOSIS — K909 Intestinal malabsorption, unspecified: Secondary | ICD-10-CM

## 2024-06-21 LAB — RETICULOCYTES
Immature Retic Fract: 18.4 % — ABNORMAL HIGH (ref 2.3–15.9)
RBC.: 3.36 MIL/uL — ABNORMAL LOW (ref 4.22–5.81)
Retic Count, Absolute: 50.7 K/uL (ref 19.0–186.0)
Retic Ct Pct: 1.5 % (ref 0.4–3.1)

## 2024-06-21 LAB — CBC WITH DIFFERENTIAL (CANCER CENTER ONLY)
Abs Immature Granulocytes: 0.01 K/uL (ref 0.00–0.07)
Basophils Absolute: 0 K/uL (ref 0.0–0.1)
Basophils Relative: 1 %
Eosinophils Absolute: 0.4 K/uL (ref 0.0–0.5)
Eosinophils Relative: 7 %
HCT: 32.9 % — ABNORMAL LOW (ref 39.0–52.0)
Hemoglobin: 10.6 g/dL — ABNORMAL LOW (ref 13.0–17.0)
Immature Granulocytes: 0 %
Lymphocytes Relative: 10 %
Lymphs Abs: 0.5 K/uL — ABNORMAL LOW (ref 0.7–4.0)
MCH: 30.8 pg (ref 26.0–34.0)
MCHC: 32.2 g/dL (ref 30.0–36.0)
MCV: 95.6 fL (ref 80.0–100.0)
Monocytes Absolute: 0.3 K/uL (ref 0.1–1.0)
Monocytes Relative: 6 %
Neutro Abs: 4 K/uL (ref 1.7–7.7)
Neutrophils Relative %: 76 %
Platelet Count: 149 K/uL — ABNORMAL LOW (ref 150–400)
RBC: 3.44 MIL/uL — ABNORMAL LOW (ref 4.22–5.81)
RDW: 15.7 % — ABNORMAL HIGH (ref 11.5–15.5)
WBC Count: 5.2 K/uL (ref 4.0–10.5)
nRBC: 0 % (ref 0.0–0.2)

## 2024-06-21 LAB — CMP (CANCER CENTER ONLY)
ALT: 16 U/L (ref 0–44)
AST: 29 U/L (ref 15–41)
Albumin: 4 g/dL (ref 3.5–5.0)
Alkaline Phosphatase: 66 U/L (ref 38–126)
Anion gap: 10 (ref 5–15)
BUN: 18 mg/dL (ref 8–23)
CO2: 27 mmol/L (ref 22–32)
Calcium: 9.5 mg/dL (ref 8.9–10.3)
Chloride: 105 mmol/L (ref 98–111)
Creatinine: 1.12 mg/dL (ref 0.61–1.24)
GFR, Estimated: >60 mL/min
Glucose, Bld: 137 mg/dL — ABNORMAL HIGH (ref 70–99)
Potassium: 4.6 mmol/L (ref 3.5–5.1)
Sodium: 142 mmol/L (ref 135–145)
Total Bilirubin: 0.7 mg/dL (ref 0.0–1.2)
Total Protein: 7.2 g/dL (ref 6.5–8.1)

## 2024-06-21 LAB — IRON AND IRON BINDING CAPACITY (CC-WL,HP ONLY)
Iron: 59 ug/dL (ref 45–182)
Saturation Ratios: 19 % (ref 17.9–39.5)
TIBC: 305 ug/dL (ref 250–450)
UIBC: 246 ug/dL

## 2024-06-21 LAB — FERRITIN: Ferritin: 288 ng/mL (ref 24–336)

## 2024-06-21 MED ORDER — SODIUM CHLORIDE 0.9 % IV SOLN: Freq: Once | INTRAVENOUS | Status: AC

## 2024-06-21 MED ORDER — LANREOTIDE ACETATE 120 MG/0.5ML ~~LOC~~ SOLN
120.0000 mg | Freq: Once | SUBCUTANEOUS | Status: AC
  Administered 2024-06-21: 120 mg via SUBCUTANEOUS
  Filled 2024-06-21: qty 120

## 2024-06-21 MED ORDER — SODIUM CHLORIDE 0.9 % IV SOLN
300.0000 mg | Freq: Once | INTRAVENOUS | Status: AC
  Administered 2024-06-21: 300 mg via INTRAVENOUS
  Filled 2024-06-21: qty 300

## 2024-06-21 MED ORDER — IRON SUCROSE 300 MG IVPB - SIMPLE MED: 300.0000 mg | Freq: Once | Status: DC

## 2024-06-21 NOTE — Patient Instructions (Signed)
 Lanreotide Injection What is this medication? LANREOTIDE (lan REE oh tide) treats high levels of growth hormone (acromegaly). It is used when other therapies have not worked well enough or cannot be tolerated. It works by reducing the amount of growth hormone your body makes. This reduces symptoms and the risk of health problems caused by too much growth hormone, such as diabetes and heart disease. It may also be used to treat neuroendocrine tumors, a cancer of the cells that release hormones and other substances in your body. It works by slowing down the release of these substances from the cells. This slows tumor growth. It also decreases the symptoms of carcinoid syndrome, such as flushing or diarrhea. This medicine may be used for other purposes; ask your health care provider or pharmacist if you have questions. COMMON BRAND NAME(S): Somatuline [place] What should I tell my care team before I take this medication? They need to know if you have any of these conditions: Diabetes Gallbladder disease Heart disease Kidney disease Liver disease Pancreatic disease Thyroid disease An unusual or allergic reaction to lanreotide, other medications, foods, dyes, or preservatives Pregnant or trying to get pregnant Breastfeeding How should I use this medication? This medication is injected under the skin. It is given by your care team in a hospital or clinic setting. Talk to your care team about the use of this medication in children. Special care may be needed. Overdosage: If you think you have taken too much of this medicine contact a poison control center or emergency room at once. NOTE: This medicine is only for you. Do not share this medicine with others. What if I miss a dose? Keep appointments for follow-up doses. It is important not to miss your dose. Call your care team if you are unable to keep an appointment. What may interact with this medication? Bromocriptine Cyclosporine Certain  medications for blood pressure, heart disease, irregular heartbeat Certain medications for diabetes Quinidine Terfenadine This list may not describe all possible interactions. Give your health care provider a list of all the medicines, herbs, non-prescription drugs, or dietary supplements you use. Also tell them if you smoke, drink alcohol, or use illegal drugs. Some items may interact with your medicine. What should I watch for while using this medication? Visit your care team for regular checks on your progress. Tell your care team if your symptoms do not start to get better or if they get worse. Your condition will be monitored carefully while you are receiving this medication. You may need blood work while you are taking this medication. This medication may increase blood sugar. The risk may be higher in patients who already have diabetes. Ask your care team what you can do to lower your risk of diabetes while taking this medication. Talk to your care team if you wish to become pregnant or think you may be pregnant. This medication can cause serious birth defects. Do not breast-feed while taking this medication and for 6 months after stopping therapy. This medication may cause infertility. Talk to your care team if you are concerned about your fertility. What side effects may I notice from receiving this medication? Side effects that you should report to your care team as soon as possible: Allergic reactions--skin rash, itching, hives, swelling of the face, lips, tongue, or throat Gallbladder problems--severe stomach pain, nausea, vomiting, fever High blood sugar (hyperglycemia)--increased thirst or amount of urine, unusual weakness or fatigue, blurry vision Increase in blood pressure Low blood sugar (hypoglycemia)--pale, blue or purple  skin or lips, sweating, fussiness, rapid heartbeat, poor feeding, low body temperature Low thyroid levels (hypothyroidism)--unusual weakness or fatigue,  increased sensitivity to cold, constipation, hair loss, dry skin, weight gain, feelings of depression Oily or light-colored stools, diarrhea, bloating, weight loss Slow heartbeat--dizziness, feeling faint or lightheaded, confusion, trouble breathing, unusual weakness or fatigue Side effects that usually do not require medical attention (report these to your care team if they continue or are bothersome): Diarrhea Dizziness Headache Muscle spasms Nausea Pain, redness, or irritation at injection site Stomach pain This list may not describe all possible side effects. Call your doctor for medical advice about side effects. You may report side effects to FDA at 1-800-FDA-1088. Where should I keep my medication? This medication is given in a hospital or clinic. It will not be stored at home. NOTE: This sheet is a summary. It may not cover all possible information. If you have questions about this medicine, talk to your doctor, pharmacist, or health care provider.  2024 Elsevier/Gold Standard (2023-09-30 00:00:00)

## 2024-06-21 NOTE — Patient Instructions (Signed)

## 2024-06-21 NOTE — Progress Notes (Signed)
 Hematology and Oncology Follow Up Visit  Brian Mays [id number] Jan 28, 1946 78 y.o. 06/21/2024   Principle Diagnosis:  Metastatic low grade neuroendocrine tumor-hepatic metastases Iron  deficiency anemia Erythropoietin  deficient anemia   Past Diagnosis:  S/p yttrium-90 intrahepatic therapy - November 2016 Lutathera  (Lu 177) injection on 02/01/2018 - s/p cycle 4 SBRT to T6 lesion -- 11/06/2021   Current Therapy:        Somatuline 120 mg q 4 week-start on 08/01/2023  IV Iron  as indicated  --Venofer  given on 06/27/2023 Lutathera -retreatment -09/06/2023 -- s/p 2/2 cycles Aranesp 300 mcg SQ q. 3 weeks for hemoglobin less than 11   Interim History:  Brian Mays is here today for follow-up.  He is feeling better since starting the Aranesp on him.  He will need some IV iron .  When we saw him, his ferritin was 343 with an iron  saturation of 16%.  He has high blood pressure.  We are going to hold the Aranesp today.  His Chromogranin A level has been trending upward.  When we last saw him, he was 216.  He does not have any problems with pain.  He has had no problems with nausea or vomiting.  He has had no change in bowel or bladder habits.  He has had no issues with cough or shortness of breath.  He has had no rashes.  There is been no leg swelling.  Currently, I would say his performance status is probably ECOG 1.   Medications:  Allergies as of 06/21/2024       Reactions   Iohexol  Hives    Code: HIVES, Desc: PER [name] @ [facility], PT IS ALLERGIC TO CONTRAST DYE 10/02/08/RM  05/01/10...needs full premeds per our protocol w/ [facility]., Onset Date: 87977990   Iodinated Contrast Media Hives        Medication List        Accurate as of June 21, 2024 10:55 AM. If you have any questions, ask your nurse or doctor.          Abrysvo 120 MCG/0.5ML injection Generic drug: RSV bivalent vaccine Inject 0.5 mLs into the muscle once.   aspirin  EC 81 MG tablet Take 81 mg by mouth every  morning.   atorvastatin  80 MG tablet Commonly known as: LIPITOR TAKE 1 TABLET BY MOUTH EVERY DAY   Cholecalciferol  25 MCG (1000 UT) tablet Take 1,000 Units by mouth 2 (two) times daily.   clopidogrel  75 MG tablet Commonly known as: PLAVIX  TAKE 1 TABLET BY MOUTH EVERY DAY   cyanocobalamin  1000 MCG tablet Commonly known as: VITAMIN B12 Take 1,000 mcg by mouth every other day.   diphenhydramine -acetaminophen  25-500 MG Tabs tablet Commonly known as: TYLENOL  PM Take 1 tablet by mouth at bedtime as needed (sleep/pain).   fluticasone  50 MCG/ACT nasal spray Commonly known as: FLONASE  Place 2 sprays into both nostrils daily as needed (sinuses).   hydrocortisone  25 MG suppository Commonly known as: ANUSOL -HC INSERT 1 SUPPOSITORY RECTALLY TWICE A DAY AS NEEDED FOR HEMORRHOIDS   ipratropium 0.03 % nasal spray Commonly known as: ATROVENT  Place 2 sprays into the nose 3 (three) times daily as needed for rhinitis.   lansoprazole  30 MG capsule Commonly known as: PREVACID  TAKE 1 CAPSULE (30 MG TOTAL) BY MOUTH DAILY AT 12 NOON.   levothyroxine  50 MCG tablet Commonly known as: SYNTHROID  TAKE 1 TABLET BY MOUTH DAILY BEFORE BREAKFAST   LORazepam  0.5 MG tablet Commonly known as: Ativan  1 tab po 30 minutes prior to radiation or  MRI scans   losartan  100 MG tablet Commonly known as: COZAAR  Take 1 tablet (100 mg total) by mouth daily.   methocarbamol  500 MG tablet Commonly known as: ROBAXIN  Take 1 tablet (500 mg total) by mouth every 8 (eight) hours as needed for muscle spasms.   metoprolol  tartrate 25 MG tablet Commonly known as: LOPRESSOR  TAKE 1/2 TABLET TWICE A DAY BY MOUTH   nitroGLYCERIN  0.4 MG SL tablet Commonly known as: NITROSTAT  PLACE 1 TABLET UNDER THE TONGUE EVERY 5 (FIVE) MINUTES X 3 DOSES AS NEEDED FOR CHEST PAIN.   ondansetron  8 MG tablet Commonly known as: ZOFRAN  Take 1 tablet (8 mg total) by mouth 2 (two) times daily as needed for nausea or vomiting.   ranolazine   500 MG 12 hr tablet Commonly known as: RANEXA  Take 1 tablet by mouth twice daily   SOMATULINE DEPOT  [place] Inject 120 mcg into the skin every 28 (twenty-eight) days. Receives at Dr Jessy office   SOOTHE XP OP Place 1 drop into both eyes daily as needed (dry eyes).        Allergies:  Allergies  Allergen Reactions   Iohexol  Hives     Code: HIVES, Desc: PER [name] @ [facility], PT IS ALLERGIC TO CONTRAST DYE 10/02/08/RM  05/01/10...needs full premeds per our protocol w/ [facility]., Onset Date: 87977990    Iodinated Contrast Media Hives    Past Medical History, Surgical history, Social history, and Family History were reviewed and updated.  Review of Systems:  Review of Systems  Constitutional:  Positive for malaise/fatigue.  HENT: Negative.    Eyes: Negative.   Respiratory: Negative.    Cardiovascular: Negative.   Gastrointestinal: Negative.   Genitourinary: Negative.   Musculoskeletal: Negative.   Skin: Negative.   Neurological: Negative.   Endo/Heme/Allergies: Negative.   Psychiatric/Behavioral: Negative.       Physical Exam:  Vital signs show temperature of 98.1.  Pulse 62.  Blood pressure 177/64.  Weight is 182 pounds.   Wt Readings from Last 3 Encounters:  05/24/24 184 lb 1.9 oz (83.5 kg)  04/26/24 183 lb (83 kg)  03/28/24 184 lb (83.5 kg)    Physical Exam Vitals reviewed.  HENT:     Head: Normocephalic and atraumatic.  Eyes:     Pupils: Pupils are equal, round, and reactive to light.  Cardiovascular:     Rate and Rhythm: Normal rate and regular rhythm.     Heart sounds: Normal heart sounds.  Pulmonary:     Effort: Pulmonary effort is normal.     Breath sounds: Normal breath sounds.  Abdominal:     General: Bowel sounds are normal.     Palpations: Abdomen is soft.  Musculoskeletal:        General: No tenderness or deformity. Normal range of motion.     Cervical back: Normal range of motion.  Lymphadenopathy:     Cervical: No cervical  adenopathy.  Skin:    General: Skin is warm and dry.     Findings: No erythema or rash.  Neurological:     Mental Status: He is alert and oriented to person, place, and time.  Psychiatric:        Behavior: Behavior normal.        Thought Content: Thought content normal.        Judgment: Judgment normal.      Lab Results  Component Value Date   WBC 5.2 06/21/2024   HGB 10.6 (L) 06/21/2024   HCT 32.9 (L) 06/21/2024  MCV 95.6 06/21/2024   PLT 149 (L) 06/21/2024   Lab Results  Component Value Date   FERRITIN 342 (H) 05/24/2024   IRON  53 05/24/2024   TIBC 328 05/24/2024   UIBC 275 05/24/2024   IRONPCTSAT 16 (L) 05/24/2024   Lab Results  Component Value Date   RETICCTPCT 1.5 06/21/2024   RBC 3.44 (L) 06/21/2024   RBC 3.36 (L) 06/21/2024   No results found for: KPAFRELGTCHN, LAMBDASER, KAPLAMBRATIO No results found for: IGGSERUM, IGA, IGMSERUM No results found for: STEPHANY CARLOTA BENSON MARKEL EARLA JOANNIE DOC VICK, SPEI   Chemistry      Component Value Date/Time   NA 142 06/21/2024 0935   NA 141 06/22/2022 0942   NA 144 10/04/2017 0904   NA 141 07/23/2016 1255   K 4.6 06/21/2024 0935   K 3.8 10/04/2017 0904   K 4.1 07/23/2016 1255   CL 105 06/21/2024 0935   CL 103 10/04/2017 0904   CO2 27 06/21/2024 0935   CO2 28 10/04/2017 0904   CO2 27 07/23/2016 1255   BUN 18 06/21/2024 0935   BUN 20 06/22/2022 0942   BUN 14 10/04/2017 0904   BUN 13.8 07/23/2016 1255   CREATININE 1.12 06/21/2024 0935   CREATININE 0.8 10/04/2017 0904   CREATININE 1.0 07/23/2016 1255      Component Value Date/Time   CALCIUM  9.5 06/21/2024 0935   CALCIUM  9.4 10/04/2017 0904   CALCIUM  9.2 07/23/2016 1255   ALKPHOS 66 06/21/2024 0935   ALKPHOS 64 10/04/2017 0904   ALKPHOS 77 07/23/2016 1255   AST 29 06/21/2024 0935   AST 23 07/23/2016 1255   ALT 16 06/21/2024 0935   ALT 22 10/04/2017 0904   ALT 13 07/23/2016 1255   BILITOT 0.7 06/21/2024  0935   BILITOT 1.13 07/23/2016 1255       Impression and Plan: Mr. Turay is a very pleasant 78 yo caucasian gentleman with metastatic low-grade neuroendocrine carcinoma with hepatic metastasis and bone metastasis.    He will continue on his Somatuline.  Hopefully, we can see the Chromogranin A level come down.  Again, I will hold on the Aranesp today.  He will get some IV iron  today.  We will plan to get him back in another month.   Maude JONELLE Crease, MD 8/21/202510:55 AM

## 2024-06-22 ENCOUNTER — Telehealth: Payer: Self-pay | Admitting: *Deleted

## 2024-06-22 ENCOUNTER — Ambulatory Visit (INDEPENDENT_AMBULATORY_CARE_PROVIDER_SITE_OTHER): Admitting: *Deleted

## 2024-06-22 VITALS — Ht 70.0 in | Wt 179.0 lb

## 2024-06-22 DIAGNOSIS — Z0000 Encounter for general adult medical examination without abnormal findings: Secondary | ICD-10-CM

## 2024-06-22 NOTE — Patient Instructions (Addendum)
 Brian Mays , Thank you for taking time out of your busy schedule to complete your Annual Wellness Visit with me. I enjoyed our conversation and look forward to speaking with you again next year. I, as well as your care team,  appreciate your ongoing commitment to your health goals. Please review the following plan we discussed and let me know if I can assist you in the future. Your Game plan/ To Do List    Follow up Visits: Next Medicare AWV with our clinical staff: 06/25/25 9:40am, telephone    Next Office Visit with your provider: 06/27/24 2:40pm, Dr Watt  Clinician Recommendations:  Aim for 30 minutes of exercise or brisk walking, 6-8 glasses of water, and 5 servings of fruits and vegetables each day.   You will need to get the following vaccines at your local pharmacy: Flu      This is a list of the screening recommended for you and due dates:  Health Maintenance  Topic Date Due   COVID-19 Vaccine (5 - 2024-25 season) 07/03/2023   Medicare Annual Wellness Visit  05/30/2024   Flu Shot  06/01/2024   DTaP/Tdap/Td vaccine (3 - Td or Tdap) 10/16/2028   Pneumococcal Vaccine for age over 62  Completed   Hepatitis C Screening  Completed   Zoster (Shingles) Vaccine  Completed   HPV Vaccine  Aged Out   Meningitis B Vaccine  Aged Out   Colon Cancer Screening  Discontinued    Advanced directives: (Copy Requested) Please bring a copy of your health care power of attorney and living will to the office to be added to your chart at your convenience. You can mail to [facility] [address] [place], [place] [address] or email to [email or web address] Advance Care Planning is important because it:  [x]  Makes sure you receive the medical care that is consistent with your values, goals, and preferences  [x]  It provides guidance to your family and loved ones and reduces their decisional burden about whether or not they are making the right decisions based on your  wishes.  Follow the link provided in your after visit summary or read over the paperwork we have mailed to you to help you started getting your Advance Directives in place. If you need assistance in completing these, please reach out to us  so that we can help you!  See attachments for Preventive Care and Fall Prevention Tips.

## 2024-06-22 NOTE — Progress Notes (Signed)
 Please attest this visit in the absence of patient primary care provider.    Subjective:   Brian Mays is a 78 y.o. who presents for a Medicare Wellness preventive visit.  As a reminder, Annual Wellness Visits don't include a physical exam, and some assessments may be limited, especially if this visit is performed virtually. We may recommend an in-person follow-up visit with your provider if needed.  Visit Complete: Virtual I connected with  Brian Mays on 06/22/24 by a audio enabled telemedicine application and verified that I am speaking with the correct person using two identifiers.  Patient Location: Home  Provider Location: Office/Clinic  I discussed the limitations of evaluation and management by telemedicine. The patient expressed understanding and agreed to proceed.  Vital Signs: Because this visit was a virtual/telehealth visit, some criteria may be missing or patient reported. Any vitals not documented were not able to be obtained and vitals that have been documented are patient reported.  VideoDeclined- This patient declined Librarian, academic. Therefore the visit was completed with audio only.  Persons Participating in Visit: Patient.  AWV Questionnaire: No: Patient Medicare AWV questionnaire was not completed prior to this visit.  Cardiac Risk Factors include: advanced age (>14men, >22 women);male gender;hypertension;Other (see comment), Risk factor comments: CAD, neuroendocrine tumor to liver     Objective:    Today's Vitals   06/22/24 0946  Weight: 179 lb (81.2 kg)  Height: 5' 10 (1.778 m)   Body mass index is 25.68 kg/m.     06/22/2024   10:09 AM 06/21/2024   10:28 AM 05/24/2024   10:09 AM 04/26/2024   10:49 AM 03/28/2024   10:43 AM 02/27/2024   10:02 AM 01/30/2024   10:12 AM  Advanced Directives  Does Patient Have a Medical Advance Directive? Yes Yes Yes Yes Yes Yes Yes  Type of Advance Directive Living will;Healthcare  Power of Attorney Living will;Healthcare Power of Attorney Living will Living will Living will Living will Living will  Does patient want to make changes to medical advance directive? No - Patient declined No - Patient declined  No - Patient declined   No - Patient declined  Copy of Healthcare Power of Attorney in Chart? No - copy requested  No - copy requested  No - copy requested No - copy requested   Would patient like information on creating a medical advance directive?   No - Patient declined  No - Patient declined No - Patient declined     Current Medications (verified) Outpatient Encounter Medications as of 06/22/2024  Medication Sig   Artificial Tear Solution (SOOTHE XP OP) Place 1 drop into both eyes daily as needed (dry eyes).   aspirin  EC 81 MG tablet Take 81 mg by mouth every morning.   atorvastatin  (LIPITOR) 80 MG tablet TAKE 1 TABLET BY MOUTH EVERY DAY   Cholecalciferol  1000 UNITS tablet Take 1,000 Units by mouth 2 (two) times daily.   clopidogrel  (PLAVIX ) 75 MG tablet TAKE 1 TABLET BY MOUTH EVERY DAY   diphenhydramine -acetaminophen  (TYLENOL  PM) 25-500 MG TABS tablet Take 1 tablet by mouth at bedtime as needed (sleep/pain).   fluticasone  (FLONASE ) 50 MCG/ACT nasal spray Place 2 sprays into both nostrils daily as needed (sinuses).   hydrocortisone  (ANUSOL -HC) 25 MG suppository INSERT 1 SUPPOSITORY RECTALLY TWICE A DAY AS NEEDED FOR HEMORRHOIDS   ipratropium (ATROVENT ) 0.03 % nasal spray Place 2 sprays into the nose 3 (three) times daily as needed for rhinitis.   Lanreotide  Acetate (SOMATULINE DEPOT  [place]) Inject 120 mcg into the skin every 28 (twenty-eight) days. Receives at Dr Jessy office   lansoprazole  (PREVACID ) 30 MG capsule TAKE 1 CAPSULE (30 MG TOTAL) BY MOUTH DAILY AT 12 NOON. (Patient taking differently: Take 30 mg by mouth daily as needed.)   levothyroxine  (SYNTHROID ) 50 MCG tablet TAKE 1 TABLET BY MOUTH DAILY BEFORE BREAKFAST   LORazepam  (ATIVAN ) 0.5 MG tablet 1 tab po 30  minutes prior to radiation or MRI scans   losartan  (COZAAR ) 100 MG tablet Take 1 tablet (100 mg total) by mouth daily.   methocarbamol  (ROBAXIN ) 500 MG tablet Take 1 tablet (500 mg total) by mouth every 8 (eight) hours as needed for muscle spasms.   metoprolol  tartrate (LOPRESSOR ) 25 MG tablet TAKE 1/2 TABLET TWICE A DAY BY MOUTH (Patient taking differently: Take 12.5 mg by mouth 2 (two) times daily. Increased by Dr Timmy to 1 tablet daily due to elevated BPs.)   nitroGLYCERIN  (NITROSTAT ) 0.4 MG SL tablet PLACE 1 TABLET UNDER THE TONGUE EVERY 5 (FIVE) MINUTES X 3 DOSES AS NEEDED FOR CHEST PAIN.   ranolazine  (RANEXA ) 500 MG 12 hr tablet Take 1 tablet by mouth twice daily   vitamin B-12 (CYANOCOBALAMIN ) 1000 MCG tablet Take 1,000 mcg by mouth every other day.   [DISCONTINUED] ABRYSVO 120 MCG/0.5ML injection Inject 0.5 mLs into the muscle once.   [DISCONTINUED] ondansetron  (ZOFRAN ) 8 MG tablet Take 1 tablet (8 mg total) by mouth 2 (two) times daily as needed for nausea or vomiting.   Facility-Administered Encounter Medications as of 06/22/2024  Medication   lanreotide acetate  (SOMATULINE DEPOT ) injection 120 mg    Allergies (verified) Iohexol  and Iodinated contrast media   History: Past Medical History:  Diagnosis Date   Anemia    in past   Cataract    Colon polyps    Diverticulosis    Gallstones    GERD (gastroesophageal reflux disease)    Heart attack (HCC)    mild, Spring 2017   Heart murmur    Hemorrhoid    Hiatal hernia    HTN (hypertension)    Hyperlipidemia    Hypothyroidism    Iron  deficiency anemia due to chronic blood loss 03/29/2019   Iron  malabsorption 03/29/2019   Metastatic carcinoma (HCC) 2010   Dr Frank   Neuroendocrine cancer [facility]) 08/2021   Pancreatitis 1998   chronic   Past Surgical History:  Procedure Laterality Date   APPENDECTOMY  1962   CARDIAC CATHETERIZATION N/A 02/10/2016   Procedure: Left Heart Cath and Coronary Angiography;  Surgeon: Gordy Bergamo, MD;  Location: [facility] INVASIVE CV LAB;  Service: Cardiovascular;  Laterality: N/A;   CARDIAC CATHETERIZATION  02/10/2016   Procedure: Coronary/Graft Atherectomy;  Surgeon: Gordy Bergamo, MD;  Location: [facility] INVASIVE CV LAB;  Service: Cardiovascular;;   CARDIAC CATHETERIZATION  02/10/2016   Procedure: Coronary Stent Intervention;  Surgeon: Gordy Bergamo, MD;  Location: [facility] INVASIVE CV LAB;  Service: Cardiovascular;;   CATARACT EXTRACTION W/ INTRAOCULAR LENS  IMPLANT, BILATERAL Bilateral    CORONARY STENT INTERVENTION N/A 12/16/2021   Procedure: CORONARY STENT INTERVENTION;  Surgeon: Wendel Lurena POUR, MD;  Location: [facility] INVASIVE CV LAB;  Service: Cardiovascular;  Laterality: N/A;   IR GENERIC HISTORICAL  12/30/2015   IR RADIOLOGIST EVAL & MGMT 12/30/2015 Marcey Moan, MD [facility] INTERV RAD   IR GENERIC HISTORICAL  11/09/2016   IR RADIOLOGIST EVAL & MGMT 11/09/2016 Marcey Moan, MD [facility] INTERV RAD   IR RADIOLOGIST EVAL & MGMT  03/01/2017  IR RADIOLOGIST EVAL & MGMT  06/29/2017   LAPAROSCOPIC CHOLECYSTECTOMY  1999   LEFT HEART CATH AND CORONARY ANGIOGRAPHY N/A 12/16/2021   Procedure: LEFT HEART CATH AND CORONARY ANGIOGRAPHY;  Surgeon: Wendel Lurena POUR, MD;  Location: [facility] INVASIVE CV LAB;  Service: Cardiovascular;  Laterality: N/A;   LIVER BIOPSY  2010   RADIOACTIVE SEED IMPLANT  X 3   to my liver   TUMOR EXCISION  01/2009   Carcinoil Resection    TUMOR REMOVAL     from small intestine   Family History  Problem Relation Age of Onset   Kidney disease Mother    Hyperlipidemia Mother    Hypertension Mother    COPD Father    Ulcerative colitis Daughter    Colon cancer Neg Hx    Esophageal cancer Neg Hx    Rectal cancer Neg Hx    Stomach cancer Neg Hx    Social History   Socioeconomic History   Marital status: Widowed    Spouse name: Not on file   Number of children: 1   Years of education: Not on file   Highest education level: Associate degree: academic program  Occupational History    Occupation: Investment banker, corporate: [employer]  Tobacco Use   Smoking status: Former    Current packs/day: 0.00    Average packs/day: 1.5 packs/day for 38.2 years (57.4 ttl pk-yrs)    Types: Cigarettes    Start date: 09/25/1959    Quit date: 12/21/1997    Years since quitting: 26.5   Smokeless tobacco: Never  Vaping Use   Vaping status: Never Used  Substance and Sexual Activity   Alcohol use: Yes    Alcohol/week: 3.0 - 4.0 standard drinks of alcohol    Types: 3 - 4 Cans of beer per week    Comment: 1-2 beers twice a week   Drug use: No   Sexual activity: Yes  Other Topics Concern   Not on file  Social History Narrative   Regular Exercise -  3 days a week x 90 min    Social Drivers of Corporate investment banker Strain: Low Risk  (06/22/2024)   Overall Financial Resource Strain (CARDIA)    Difficulty of Paying Living Expenses: Not very hard  Food Insecurity: No Food Insecurity (06/22/2024)   Hunger Vital Sign    Worried About Running Out of Food in the Last Year: Never true    Ran Out of Food in the Last Year: Never true  Transportation Needs: No Transportation Needs (06/22/2024)   PRAPARE - Administrator, Civil Service (Medical): No    Lack of Transportation (Non-Medical): No  Physical Activity: Sufficiently Active (06/22/2024)   Exercise Vital Sign    Days of Exercise per Week: 5 days    Minutes of Exercise per Session: 60 min  Stress: No Stress Concern Present (06/22/2024)   [employer] of Occupational Health - Occupational Stress Questionnaire    Feeling of Stress: Only a little  Social Connections: Moderately Isolated (06/22/2024)   Social Connection and Isolation Panel    Frequency of Communication with Friends and Family: More than three times a week    Frequency of Social Gatherings with Friends and Family: Three times a week    Attends Religious Services: Never    Active Member of Clubs or Organizations: Yes    Attends Banker  Meetings: More than 4 times per year    Marital Status: Widowed  Tobacco Counseling Counseling given: Not Answered    Clinical Intake:  Pre-visit preparation completed: Yes        BMI - recorded: 25.68 Nutritional Status: BMI 25 -29 Overweight Nutritional Risks: None Diabetes: No  Lab Results  Component Value Date   HGBA1C 5.9 08/04/2023   HGBA1C 6.2 09/03/2021   HGBA1C 5.9 (H) 07/10/2020     How often do you need to have someone help you when you read instructions, pamphlets, or other written materials from your doctor or pharmacy?: 1 - Never What is the last grade level you completed in school?: Associate's degree  Interpreter Needed?: No  Information entered by :: Lolita Libra, CMA   Activities of Daily Living     06/22/2024   10:07 AM 06/12/2024    3:43 PM  In your present state of health, do you have any difficulty performing the following activities:  Hearing? 0 0  Vision? 0 0  Difficulty concentrating or making decisions? 0 0  Walking or climbing stairs? 0 0  Dressing or bathing? 0 0  Doing errands, shopping? 0 0  Preparing Food and eating ? N N  Using the Toilet? N N  In the past six months, have you accidently leaked urine? N N  Do you have problems with loss of bowel control? N N  Managing your Medications? N N  Managing your Finances? N N  Housekeeping or managing your Housekeeping? N N    Patient Care Team: Copland, Harlene BROCKS, MD as PCP - General (Family Medicine) Nahser, Aleene PARAS, MD (Inactive) as PCP - Cardiology (Cardiology) Abran Norleen SAILOR, MD (Gastroenterology) Timmy Maude SAUNDERS, MD as Consulting Physician (Oncology) [employer] (Ophthalmology)  I have updated your Care Teams any recent Medical Services you may have received from other providers in the past year.     Assessment:   This is a routine wellness examination for [place].  Hearing/Vision screen Hearing Screening - Comments:: Has noted slight  decrease.  Vision Screening - Comments:: Wears RX glasses -- up to date with routine eye exams. Harlene Pan, [employer].    Goals Addressed   None    Depression Screen     06/22/2024   10:04 AM 06/21/2024   11:00 AM 05/24/2024   10:06 AM 05/31/2023    9:34 AM 09/08/2022    1:57 PM 02/22/2022    9:40 AM 09/03/2021    8:52 AM  PHQ 2/9 Scores  PHQ - 2 Score 0 0 0 0 0 0 0  PHQ- 9 Score 2   0       Fall Risk     06/22/2024    9:58 AM 06/12/2024    3:43 PM 05/28/2023   12:21 PM 09/08/2022    1:57 PM 02/22/2022    9:38 AM  Fall Risk   Falls in the past year? 1 1 0 0 0  Comment Fell in garden and tripped      Number falls in past yr: 0 0 0 0 0  Injury with Fall? 0 0 0 0 0  Risk for fall due to : Impaired balance/gait  No Fall Risks    Follow up Education provided  Falls prevention discussed Falls evaluation completed  Falls prevention discussed      Data saved with a previous flowsheet row definition    MEDICARE RISK AT HOME:  Medicare Risk at Home Any stairs in or around the home?: Yes If so, are there any without handrails?: Yes Home free of  loose throw rugs in walkways, pet beds, electrical cords, etc?: Yes Adequate lighting in your home to reduce risk of falls?: Yes Life alert?: No Use of a cane, walker or w/c?: No Grab bars in the bathroom?: Yes Shower chair or bench in shower?: Yes Elevated toilet seat or a handicapped toilet?: Yes  TIMED UP AND GO:  Was the test performed?  No,audio  Cognitive Function: 6CIT completed        06/22/2024   10:17 AM 05/31/2023    9:39 AM  6CIT Screen  What Year? 0 points 0 points  What month? 0 points 0 points  What time? 0 points 0 points  Count back from 20 0 points 0 points  Months in reverse 0 points 0 points  Repeat phrase 0 points 0 points  Total Score 0 points 0 points    Immunizations Immunization History  Administered Date(s) Administered   Fluad Quad(high Dose 65+) 09/03/2021, 09/08/2022   Fluad Trivalent(High Dose  65+) 08/04/2023   H1N1 10/04/2008   Influenza Split 09/30/2011, 08/04/2012   Influenza, High Dose Seasonal PF 06/23/2017   Influenza,inj,Quad PF,6+ Mos 09/28/2013, 07/26/2014, 07/14/2015, 07/23/2016   PFIZER Comirnaty(Gray Top)Covid-19 Tri-Sucrose Vaccine 03/23/2021   PFIZER(Purple Top)SARS-COV-2 Vaccination 12/15/2019, 01/09/2020, 10/18/2020   Pneumococcal Conjugate-13 03/06/2015   Pneumococcal Polysaccharide-23 03/30/2011, 07/10/2020   Respiratory Syncytial Virus Vaccine,Recomb Aduvanted(Arexvy) 08/08/2023   Td 03/06/2015   Tdap 10/16/2018   Zoster Recombinant(Shingrix) 10/20/2020, 12/30/2020    Screening Tests Health Maintenance  Topic Date Due   COVID-19 Vaccine (5 - 2024-25 season) 07/03/2023   Medicare Annual Wellness (AWV)  05/30/2024   INFLUENZA VACCINE  06/01/2024   DTaP/Tdap/Td (3 - Td or Tdap) 10/16/2028   Pneumococcal Vaccine: 50+ Years  Completed   Hepatitis C Screening  Completed   Zoster Vaccines- Shingrix  Completed   HPV VACCINES  Aged Out   Meningococcal B Vaccine  Aged Out   Colonoscopy  Discontinued    Health Maintenance  Health Maintenance Due  Topic Date Due   COVID-19 Vaccine (5 - 2024-25 season) 07/03/2023   Medicare Annual Wellness (AWV)  05/30/2024   INFLUENZA VACCINE  06/01/2024   Health Maintenance Items Addressed: Will get flu vaccine at pharmacy. All other HM up to date.  Additional Screening:  Vision Screening: Recommended annual ophthalmology exams for early detection of glaucoma and other disorders of the eye. Would you like a referral to an eye doctor? No    Dental Screening: Recommended annual dental exams for proper oral hygiene  Community Resource Referral / Chronic Care Management: CRR required this visit?  No   CCM required this visit?  No   Plan:    I have personally reviewed and noted the following in the patient's chart:   Medical and social history Use of alcohol, tobacco or illicit drugs  Current medications and  supplements including opioid prescriptions. Patient is not currently taking opioid prescriptions. Functional ability and status Nutritional status Physical activity Advanced directives List of other physicians Hospitalizations, surgeries, and ER visits in previous 12 months Vitals Screenings to include cognitive, depression, and falls Referrals and appointments  In addition, I have reviewed and discussed with patient certain preventive protocols, quality metrics, and best practice recommendations. A written personalized care plan for preventive services as well as general preventive health recommendations were provided to patient.   Lolita Libra, CMA   06/22/2024   After Visit Summary: (MyChart) Due to this being a telephonic visit, the after visit summary with patients personalized plan was  offered to patient via MyChart   Notes: see phone note

## 2024-06-22 NOTE — Telephone Encounter (Signed)
 Pt had AWV today. Pt wants to discuss referral for back pain if appropriate. Also reports balance issue x 9 months and now becoming more constant. Scheduled pt OV with PCP for 06/27/24. Please advise if other recommendations.

## 2024-06-23 LAB — CHROMOGRANIN A: Chromogranin A (ng/mL): 161.8 ng/mL — ABNORMAL HIGH (ref 0.0–101.8)

## 2024-06-25 NOTE — Progress Notes (Signed)
 [facility] [address], [address] [place], [place] [address] [phone]  Date:  06/27/2024   Name:  BAIN Mays   DOB:  [birth year]   MRN:  [id number]  PCP:  [name], MD    Chief Complaint: Back Pain (Ongoing for 1 year, feels this is causing his balance to worsen)   History of Present Illness:  Brian Mays is a 78 y.o. very pleasant male patient who presents with the following:  Patient seen today with concerns about back pain and his balance I last saw Shameek in May; at that time he had recently strained his back by pouring some concrete at home -  history of metastatic malignant neuroendocrine tumor in the liver/carcinoid syndrome status post partial small bowel resection in 2010, as well as iron  malabsorption, CAD status post stenting in 2017, hypothyroidism, hypertension, chronic pancreatitis    I reviewed his most recent hematology/oncology note from August 21-he does have known hepatic and bone mets Most recent PET scan from June shows unchanged skeletal mets  Lab Results  Component Value Date   TSH 3.48 08/04/2023   Discussed the use of AI scribe software for clinical note transcription with the patient, who gave verbal consent to proceed.  History of Present Illness Brian Mays is a 78 year old male with cancer and bony metastases who presents with persistent back pain.  He has been experiencing persistent back pain for approximately a year, which began after lifting concrete. The pain is located in the middle of his back and radiates to the left side, but does not extend down his legs. It is most severe in the morning upon getting out of bed and improves after a few minutes of activity. He takes Aleve  Dual Action, which he believes helps alleviate the pain.  He has a history of bony lesions in his spine, with the most recent imaging from a year and a half ago. A PET scan in June showed no  changes. He has not had any recent plain x-rays of his spine.  He reports worsening balance over time, feeling unsteady on his feet, and has experienced a fall in the garden while wearing flip flops. No numbness in his feet.  He has a history of a heart murmur and elevated blood pressure. His blood pressure readings have varied, with recent measurements at home and in the office showing elevated levels. He is currently taking losartan  100 mg and metoprolol  25 mg twice daily, with adjustments made to his metoprolol  dosage due to lightheadedness.    BP Readings from Last 3 Encounters:  06/27/24 (!) 150/80  06/21/24 (!) 170/67  06/21/24 (!) 155/57   He is taking metoprolol  25 twice daily and losatan 100 mg   Patient Active Problem List   Diagnosis Date Noted   Metastatic cancer to spine (HCC) 02/08/2022   Prediabetes 09/03/2021   Near syncope 04/22/2020   Iron  deficiency anemia due to chronic blood loss 03/29/2019   Iron  malabsorption 03/29/2019   Lumbar radiculopathy 08/16/2017   CAD (coronary artery disease) 09/21/2016   Unstable angina (HCC) 02/09/2016   Vitamin D  deficiency 03/11/2015   Metastatic malignant neuroendocrine tumor to liver (HCC) 02/14/2015   Hyperglycemia 04/03/2012   Diarrhea 03/29/2012   B12 deficiency 04/19/2011   Antiplatelet or antithrombotic long-term use 03/30/2011   TOBACCO USE, QUIT 10/13/2009   Unspecified vitamin D  deficiency 06/19/2009   Neoplasm by body site  12/23/2008   DIVERTICULOSIS, COLON 11/08/2008   ABDOMINAL PAIN 10/02/2008   Hypothyroidism 12/13/2007   HEMORRHOIDS, NOS 12/13/2007   Belching 12/13/2007   History of colonic polyps 12/13/2007   Essential hypertension 09/15/2007   Gastroesophageal reflux disease 09/15/2007   PANCREATITIS, CHRONIC 09/15/2007   CARDIAC MURMUR 09/15/2007   SNORING 09/15/2007    Past Medical History:  Diagnosis Date   Anemia    in past   Cataract    Colon polyps    Diverticulosis    Gallstones    GERD  (gastroesophageal reflux disease)    Heart attack (HCC)    mild, Spring 2017   Heart murmur    Hemorrhoid    Hiatal hernia    HTN (hypertension)    Hyperlipidemia    Hypothyroidism    Iron  deficiency anemia due to chronic blood loss 03/29/2019   Iron  malabsorption 03/29/2019   Metastatic carcinoma (HCC) 2010   Dr Frank   Neuroendocrine cancer [facility]) 08/2021   Pancreatitis 1998   chronic    Past Surgical History:  Procedure Laterality Date   APPENDECTOMY  1962   CARDIAC CATHETERIZATION N/A 02/10/2016   Procedure: Left Heart Cath and Coronary Angiography;  Surgeon: Gordy Bergamo, MD;  Location: [facility] INVASIVE CV LAB;  Service: Cardiovascular;  Laterality: N/A;   CARDIAC CATHETERIZATION  02/10/2016   Procedure: Coronary/Graft Atherectomy;  Surgeon: Gordy Bergamo, MD;  Location: [facility] INVASIVE CV LAB;  Service: Cardiovascular;;   CARDIAC CATHETERIZATION  02/10/2016   Procedure: Coronary Stent Intervention;  Surgeon: Gordy Bergamo, MD;  Location: [facility] INVASIVE CV LAB;  Service: Cardiovascular;;   CATARACT EXTRACTION W/ INTRAOCULAR LENS  IMPLANT, BILATERAL Bilateral    CORONARY STENT INTERVENTION N/A 12/16/2021   Procedure: CORONARY STENT INTERVENTION;  Surgeon: Wendel Lurena POUR, MD;  Location: [facility] INVASIVE CV LAB;  Service: Cardiovascular;  Laterality: N/A;   IR GENERIC HISTORICAL  12/30/2015   IR RADIOLOGIST EVAL & MGMT 12/30/2015 Marcey Moan, MD [facility] INTERV RAD   IR GENERIC HISTORICAL  11/09/2016   IR RADIOLOGIST EVAL & MGMT 11/09/2016 Marcey Moan, MD [facility] INTERV RAD   IR RADIOLOGIST EVAL & MGMT  03/01/2017   IR RADIOLOGIST EVAL & MGMT  06/29/2017   LAPAROSCOPIC CHOLECYSTECTOMY  1999   LEFT HEART CATH AND CORONARY ANGIOGRAPHY N/A 12/16/2021   Procedure: LEFT HEART CATH AND CORONARY ANGIOGRAPHY;  Surgeon: Wendel Lurena POUR, MD;  Location: [facility] INVASIVE CV LAB;  Service: Cardiovascular;  Laterality: N/A;   LIVER BIOPSY  2010   RADIOACTIVE SEED IMPLANT  X 3   to my liver   TUMOR EXCISION  01/2009    Carcinoil Resection    TUMOR REMOVAL     from small intestine    Social History   Tobacco Use   Smoking status: Former    Current packs/day: 0.00    Average packs/day: 1.5 packs/day for 38.2 years (57.4 ttl pk-yrs)    Types: Cigarettes    Start date: 09/25/1959    Quit date: 12/21/1997    Years since quitting: 26.5   Smokeless tobacco: Never  Vaping Use   Vaping status: Never Used  Substance Use Topics   Alcohol use: Yes    Alcohol/week: 3.0 - 4.0 standard drinks of alcohol    Types: 3 - 4 Cans of beer per week    Comment: 1-2 beers twice a week   Drug use: No    Family History  Problem Relation Age of Onset   Kidney disease Mother    Hyperlipidemia Mother  Hypertension Mother    COPD Father    Ulcerative colitis Daughter    Colon cancer Neg Hx    Esophageal cancer Neg Hx    Rectal cancer Neg Hx    Stomach cancer Neg Hx     Allergies  Allergen Reactions   Iohexol  Hives     Code: HIVES, Desc: PER [name] @ [facility]IMARY [facility]ARE, PT IS ALLERGIC TO CONTRAST DYE 10/02/08/RM  05/01/10...needs full premeds per our protocol w/ [facility]., Onset Date: [id number]    Iodinated Contrast Media Hives    Medication list has been reviewed and updated.  Current Outpatient Medications on File Prior to Visit  Medication Sig Dispense Refill   Artificial Tear Solution (SOOTHE XP OP) Place 1 drop into both eyes daily as needed (dry eyes).     aspirin  EC 81 MG tablet Take 81 mg by mouth every morning.     atorvastatin  (LIPITOR) 80 MG tablet TAKE 1 TABLET BY MOUTH EVERY DAY 90 tablet 1   Cholecalciferol  1000 UNITS tablet Take 1,000 Units by mouth 2 (two) times daily.     clopidogrel  (PLAVIX ) 75 MG tablet TAKE 1 TABLET BY MOUTH EVERY DAY 90 tablet 3   diphenhydramine -acetaminophen  (TYLENOL  PM) 25-500 MG TABS tablet Take 1 tablet by mouth at bedtime as needed (sleep/pain).     fluticasone  (FLONASE ) 50 MCG/ACT nasal spray Place 2 sprays into both nostrils daily as needed (sinuses).  11    hydrocortisone  (ANUSOL -HC) 25 MG suppository INSERT 1 SUPPOSITORY RECTALLY TWICE A DAY AS NEEDED FOR HEMORRHOIDS 30 suppository 0   ipratropium (ATROVENT ) 0.03 % nasal spray Place 2 sprays into the nose 3 (three) times daily as needed for rhinitis. 30 mL 6   Lanreotide Acetate  (SOMATULINE DEPOT  [place]) Inject 120 mcg into the skin every 28 (twenty-eight) days. Receives at Dr Jessy office     lansoprazole  (PREVACID ) 30 MG capsule TAKE 1 CAPSULE (30 MG TOTAL) BY MOUTH DAILY AT 12 NOON. (Patient taking differently: Take 30 mg by mouth daily as needed.) 90 capsule 1   levothyroxine  (SYNTHROID ) 50 MCG tablet TAKE 1 TABLET BY MOUTH DAILY BEFORE BREAKFAST 90 tablet 1   LORazepam  (ATIVAN ) 0.5 MG tablet 1 tab po 30 minutes prior to radiation or MRI scans 5 tablet 0   losartan  (COZAAR ) 100 MG tablet Take 1 tablet (100 mg total) by mouth daily. 90 tablet 3   metoprolol  tartrate (LOPRESSOR ) 25 MG tablet TAKE 1/2 TABLET TWICE A DAY BY MOUTH (Patient taking differently: Take 12.5 mg by mouth 2 (two) times daily. Increased by Dr Timmy to 1 tablet daily due to elevated BPs.) 90 tablet 2   nitroGLYCERIN  (NITROSTAT ) 0.4 MG SL tablet PLACE 1 TABLET UNDER THE TONGUE EVERY 5 (FIVE) MINUTES X 3 DOSES AS NEEDED FOR CHEST PAIN. 25 tablet 0   ranolazine  (RANEXA ) 500 MG 12 hr tablet Take 1 tablet by mouth twice daily 180 tablet 1   vitamin B-12 (CYANOCOBALAMIN ) 1000 MCG tablet Take 1,000 mcg by mouth every other day.     methocarbamol  (ROBAXIN ) 500 MG tablet Take 1 tablet (500 mg total) by mouth every 8 (eight) hours as needed for muscle spasms. (Patient not taking: Reported on 06/27/2024) 20 tablet 0   Current Facility-Administered Medications on File Prior to Visit  Medication Dose Route Frequency Provider Last Rate Last Admin   lanreotide acetate  (SOMATULINE DEPOT ) injection 120 mg  120 mg Subcutaneous Q14 Days Timmy Maude SAUNDERS, MD        Review of Systems:  As  per HPI- otherwise negative.   Physical  Examination: Vitals:   06/27/24 1429 06/27/24 1513  BP: (!) 154/82 (!) 150/80  Pulse: 65   Temp: (!) 97.2 F (36.2 C)   SpO2: 98%    Vitals:   06/27/24 1429  Weight: 182 lb (82.6 kg)  Height: 5' 10 (1.778 m)   Body mass index is 26.11 kg/m. Ideal Body Weight: Weight in (lb) to have BMI = 25: 173.9  GEN: no acute distress.  Normal weight, looks well HEENT: Atraumatic, Normocephalic.  Ears and Nose: No external deformity. CV: RRR, No M/G/R. No JVD. No thrill. No extra heart sounds. PULM: CTA B, no wheezes, crackles, rhonchi. No retractions. No resp. distress. No accessory muscle use. ABD: S, NT, ND EXTR: No c/c/e PSYCH: Normally interactive. Conversant.  Pt indicates location of his back pain as the midline spine right around the thoracolumbar junction.  I am not able to reproduce pain by palpating this area.  No redness, bruising or muscle spasm is present.  Normal bilateral lower extremity strength and sensation, negative straight leg raise bilaterally  Assessment and Plan: Coronary artery disease involving native coronary artery of native heart without angina pectoris - Plan: Ambulatory referral to Cardiology  Essential hypertension - Plan: Ambulatory referral to Cardiology, amLODipine  (NORVASC ) 2.5 MG tablet  Metastatic malignant neuroendocrine tumor to liver (HCC)  Hypothyroidism due to non-medication exogenous substances  Lumbar radiculopathy  Metastatic cancer to spine (HCC)  Heart murmur - Plan: Ambulatory referral to Cardiology  Gait instability - Plan: Ambulatory referral to Physical Therapy  Assessment & Plan Back pain with thoracic spine lesions and history of bony metastases Chronic thoracic back pain with stable bony metastases. Differential includes age-related changes versus metastatic progression. Aleve  Dual Action effective without renal or hepatic side effects. - Contact Dr. Enniver for potential MRI of the spine.  He recently had a PET scan, I am not  sure if MRI would be redundant - Continue Aleve  Dual Action, monitor for gastritis.  Unsteadiness on feet Decreased balance with recent fall. Likely age-related but requires further evaluation. - Refer to physical therapy for balance training at [facility].  Cardiac murmur and atherosclerotic heart disease Loud murmur with atherosclerotic heart disease. No current cardiology follow-up. - Refer to [facility] cardiology for evaluation and management.  Essential hypertension Elevated blood pressure despite maximum losartan  and metoprolol . Amlodipine  added for better control. - Prescribe amlodipine  2.5 mg. - Continue losartan  and metoprolol .  Signed Harlene Schroeder, MD "

## 2024-06-25 NOTE — Patient Instructions (Incomplete)
 It was good to see you again today Recommend flu shot, COVID booster this fall  For your skin folds-you might try using a plain OTC antifungal cream as opposed to the prescription that also contains a steroid  Good options are-ketoconazole, clotrimazole, miconazole  Potentially also try applying barrier cream or ointment such as Desitin or plain petroleum jelly.  Some absorbent fabric between skin folds may be helpful.  I am really hopeful plastic surgery will be able to help you when you see them in a couple of weeks-let me know what happens!   We will do [employer] testing today to help [place] determine which medication may be best for your anxiety and depression symptoms.  I will let you know what we find out!

## 2024-06-27 ENCOUNTER — Ambulatory Visit (INDEPENDENT_AMBULATORY_CARE_PROVIDER_SITE_OTHER): Admitting: Family Medicine

## 2024-06-27 ENCOUNTER — Encounter: Payer: Self-pay | Admitting: Family Medicine

## 2024-06-27 DIAGNOSIS — E032 Hypothyroidism due to medicaments and other exogenous substances: Secondary | ICD-10-CM

## 2024-06-27 DIAGNOSIS — I2510 Atherosclerotic heart disease of native coronary artery without angina pectoris: Secondary | ICD-10-CM | POA: Diagnosis not present

## 2024-06-27 DIAGNOSIS — C7B8 Other secondary neuroendocrine tumors: Secondary | ICD-10-CM | POA: Diagnosis not present

## 2024-06-27 DIAGNOSIS — R2681 Unsteadiness on feet: Secondary | ICD-10-CM | POA: Diagnosis not present

## 2024-06-27 DIAGNOSIS — C7951 Secondary malignant neoplasm of bone: Secondary | ICD-10-CM

## 2024-06-27 DIAGNOSIS — R011 Cardiac murmur, unspecified: Secondary | ICD-10-CM | POA: Diagnosis not present

## 2024-06-27 DIAGNOSIS — I10 Essential (primary) hypertension: Secondary | ICD-10-CM | POA: Diagnosis not present

## 2024-06-27 DIAGNOSIS — M5416 Radiculopathy, lumbar region: Secondary | ICD-10-CM | POA: Diagnosis not present

## 2024-06-27 MED ORDER — AMLODIPINE BESYLATE 2.5 MG PO TABS: 2.5000 mg | ORAL_TABLET | Freq: Every day | ORAL | 1 refills | Status: DC

## 2024-06-30 ENCOUNTER — Encounter: Payer: Self-pay | Admitting: Family Medicine

## 2024-06-30 DIAGNOSIS — M546 Pain in thoracic spine: Secondary | ICD-10-CM

## 2024-06-30 DIAGNOSIS — M5416 Radiculopathy, lumbar region: Secondary | ICD-10-CM

## 2024-06-30 DIAGNOSIS — C7B8 Other secondary neuroendocrine tumors: Secondary | ICD-10-CM

## 2024-07-03 ENCOUNTER — Other Ambulatory Visit: Payer: Self-pay

## 2024-07-03 ENCOUNTER — Other Ambulatory Visit: Payer: Self-pay | Admitting: Family Medicine

## 2024-07-03 DIAGNOSIS — E039 Hypothyroidism, unspecified: Secondary | ICD-10-CM

## 2024-07-04 MED ORDER — RANOLAZINE ER 500 MG PO TB12: 500.0000 mg | ORAL_TABLET | Freq: Two times a day (BID) | ORAL | 0 refills | Status: DC

## 2024-07-13 ENCOUNTER — Inpatient Hospital Stay

## 2024-07-13 ENCOUNTER — Inpatient Hospital Stay: Attending: Hematology & Oncology

## 2024-07-13 ENCOUNTER — Inpatient Hospital Stay (HOSPITAL_BASED_OUTPATIENT_CLINIC_OR_DEPARTMENT_OTHER): Admitting: Hematology & Oncology

## 2024-07-13 ENCOUNTER — Encounter: Payer: Self-pay | Admitting: Hematology & Oncology

## 2024-07-13 VITALS — BP 150/54 | HR 56 | Temp 97.8°F | Resp 18 | Ht 70.0 in | Wt 182.0 lb

## 2024-07-13 DIAGNOSIS — D500 Iron deficiency anemia secondary to blood loss (chronic): Secondary | ICD-10-CM

## 2024-07-13 DIAGNOSIS — C7A8 Other malignant neuroendocrine tumors: Secondary | ICD-10-CM | POA: Diagnosis present

## 2024-07-13 DIAGNOSIS — C7B8 Other secondary neuroendocrine tumors: Secondary | ICD-10-CM

## 2024-07-13 DIAGNOSIS — K769 Liver disease, unspecified: Secondary | ICD-10-CM | POA: Diagnosis not present

## 2024-07-13 DIAGNOSIS — D509 Iron deficiency anemia, unspecified: Secondary | ICD-10-CM | POA: Insufficient documentation

## 2024-07-13 LAB — CMP (CANCER CENTER ONLY)
ALT: 13 U/L (ref 0–44)
AST: 29 U/L (ref 15–41)
Albumin: 4.1 g/dL (ref 3.5–5.0)
Alkaline Phosphatase: 64 U/L (ref 38–126)
Anion gap: 9 (ref 5–15)
BUN: 20 mg/dL (ref 8–23)
CO2: 29 mmol/L (ref 22–32)
Calcium: 9.5 mg/dL (ref 8.9–10.3)
Chloride: 104 mmol/L (ref 98–111)
Creatinine: 1.13 mg/dL (ref 0.61–1.24)
GFR, Estimated: >60 mL/min (ref >60)
Glucose, Bld: 103 mg/dL — ABNORMAL HIGH (ref 70–99)
Potassium: 4.2 mmol/L (ref 3.5–5.1)
Sodium: 142 mmol/L (ref 135–145)
Total Bilirubin: 0.7 mg/dL (ref 0.0–1.2)
Total Protein: 7.4 g/dL (ref 6.5–8.1)

## 2024-07-13 LAB — RETICULOCYTES
Immature Retic Fract: 15.4 % (ref 2.3–15.9)
RBC.: 3.45 MIL/uL — ABNORMAL LOW (ref 4.22–5.81)
Retic Count, Absolute: 49 K/uL (ref 19.0–186.0)
Retic Ct Pct: 1.4 % (ref 0.4–3.1)

## 2024-07-13 LAB — CBC WITH DIFFERENTIAL (CANCER CENTER ONLY)
Abs Immature Granulocytes: 0.04 K/uL (ref 0.00–0.07)
Basophils Absolute: 0 K/uL (ref 0.0–0.1)
Basophils Relative: 1 %
Eosinophils Absolute: 0.4 K/uL (ref 0.0–0.5)
Eosinophils Relative: 7 %
HCT: 33.2 % — ABNORMAL LOW (ref 39.0–52.0)
Hemoglobin: 10.7 g/dL — ABNORMAL LOW (ref 13.0–17.0)
Immature Granulocytes: 1 %
Lymphocytes Relative: 11 %
Lymphs Abs: 0.6 K/uL — ABNORMAL LOW (ref 0.7–4.0)
MCH: 31.1 pg (ref 26.0–34.0)
MCHC: 32.2 g/dL (ref 30.0–36.0)
MCV: 96.5 fL (ref 80.0–100.0)
Monocytes Absolute: 0.4 K/uL (ref 0.1–1.0)
Monocytes Relative: 8 %
Neutro Abs: 3.6 K/uL (ref 1.7–7.7)
Neutrophils Relative %: 72 %
Platelet Count: 139 K/uL — ABNORMAL LOW (ref 150–400)
RBC: 3.44 MIL/uL — ABNORMAL LOW (ref 4.22–5.81)
RDW: 15.6 % — ABNORMAL HIGH (ref 11.5–15.5)
WBC Count: 4.9 K/uL (ref 4.0–10.5)
nRBC: 0 % (ref 0.0–0.2)

## 2024-07-13 LAB — IRON AND IRON BINDING CAPACITY (CC-WL,HP ONLY)
Iron: 54 ug/dL (ref 45–182)
Saturation Ratios: 18 % (ref 17.9–39.5)
TIBC: 297 ug/dL (ref 250–450)
UIBC: 243 ug/dL

## 2024-07-13 LAB — FERRITIN: Ferritin: 353 ng/mL — ABNORMAL HIGH (ref 24–336)

## 2024-07-13 MED ORDER — LANREOTIDE ACETATE 120 MG/0.5ML ~~LOC~~ SOLN
120.0000 mg | SUBCUTANEOUS | Status: DC
  Administered 2024-07-13: 120 mg via SUBCUTANEOUS
  Filled 2024-07-13: qty 120

## 2024-07-13 NOTE — Progress Notes (Signed)
 Hematology and Oncology Follow Up Visit  Brian Mays [id number] 1946-08-08 78 y.o. 07/13/2024   Principle Diagnosis:  Metastatic low grade neuroendocrine tumor-hepatic metastases Iron  deficiency anemia Erythropoietin  deficient anemia   Past Diagnosis:  S/p yttrium-90 intrahepatic therapy - November 2016 Lutathera  (Lu 177) injection on 02/01/2018 - s/p cycle 4 SBRT to T6 lesion -- 11/06/2021   Current Therapy:        Somatuline 120 mg q 4 week-start on 08/01/2023  IV Iron  as indicated  --Venofer  given on 06/27/2023 Lutathera -retreatment -09/06/2023 -- s/p 2/2 cycles Aranesp 300 mcg SQ q. 3 weeks for hemoglobin less than 10   Interim History:  Brian Mays is here today for follow-up.  The big news is that he is going on a road trip out west.  He and her younger brother will be driving today.  They will be leaving and going out to [place]  and other places in the [place].  He will be gone for about 3 weeks.  He is doing okay.  His last Chromogranin A level was 162.  We now have him on Aranesp.  His hemoglobin has improved somewhat.  He does get IV iron .  When we last saw him, his iron  studies showed a ferritin of 288 with an iron  saturation of 19%.  He is still pretty active.  He has had no problems with cough.  He has had no cardiac issues.  His blood pressure is doing a little bit better.  Overall, I would say that his performance status is probably ECOG 1.    Medications:  Allergies as of 07/13/2024       Reactions   Iohexol  Hives    Code: HIVES, Desc: PER [name] @ [facility], PT IS ALLERGIC TO CONTRAST DYE 10/02/08/RM  05/01/10...needs full premeds per our protocol w/ [facility]., Onset Date: 87977990   Iodinated Contrast Media Hives        Medication List        Accurate as of July 13, 2024 12:22 PM. If you have any questions, ask your nurse or doctor.          amLODipine  2.5 MG tablet Commonly known as: NORVASC  Take 1 tablet (2.5 mg total) by mouth daily.    aspirin  EC 81 MG tablet Take 81 mg by mouth every morning.   atorvastatin  80 MG tablet Commonly known as: LIPITOR TAKE 1 TABLET BY MOUTH EVERY DAY   Cholecalciferol  25 MCG (1000 UT) tablet Take 1,000 Units by mouth 2 (two) times daily.   clopidogrel  75 MG tablet Commonly known as: PLAVIX  TAKE 1 TABLET BY MOUTH EVERY DAY   cyanocobalamin  1000 MCG tablet Commonly known as: VITAMIN B12 Take 1,000 mcg by mouth every other day.   diphenhydramine -acetaminophen  25-500 MG Tabs tablet Commonly known as: TYLENOL  PM Take 1 tablet by mouth at bedtime as needed (sleep/pain).   fluticasone  50 MCG/ACT nasal spray Commonly known as: FLONASE  Place 2 sprays into both nostrils daily as needed (sinuses).   hydrocortisone  25 MG suppository Commonly known as: ANUSOL -HC INSERT 1 SUPPOSITORY RECTALLY TWICE A DAY AS NEEDED FOR HEMORRHOIDS   ipratropium 0.03 % nasal spray Commonly known as: ATROVENT  Place 2 sprays into the nose 3 (three) times daily as needed for rhinitis.   lansoprazole  30 MG capsule Commonly known as: PREVACID  TAKE 1 CAPSULE (30 MG TOTAL) BY MOUTH DAILY AT 12 NOON.   levothyroxine  50 MCG tablet Commonly known as: SYNTHROID  TAKE 1 TABLET BY MOUTH EVERY DAY BEFORE BREAKFAST   LORazepam   0.5 MG tablet Commonly known as: Ativan  1 tab po 30 minutes prior to radiation or MRI scans   losartan  100 MG tablet Commonly known as: COZAAR  Take 1 tablet (100 mg total) by mouth daily.   methocarbamol  500 MG tablet Commonly known as: ROBAXIN  Take 1 tablet (500 mg total) by mouth every 8 (eight) hours as needed for muscle spasms.   metoprolol  tartrate 25 MG tablet Commonly known as: LOPRESSOR  TAKE 1/2 TABLET TWICE A DAY BY MOUTH What changed: See the new instructions.   nitroGLYCERIN  0.4 MG SL tablet Commonly known as: NITROSTAT  PLACE 1 TABLET UNDER THE TONGUE EVERY 5 (FIVE) MINUTES X 3 DOSES AS NEEDED FOR CHEST PAIN.   ranolazine  500 MG 12 hr tablet Commonly known as:  RANEXA  Take 1 tablet (500 mg total) by mouth 2 (two) times daily.   SOMATULINE DEPOT  [place] Inject 120 mcg into the skin every 28 (twenty-eight) days. Receives at Dr Jessy office   SOOTHE XP OP Place 1 drop into both eyes daily as needed (dry eyes).        Allergies:  Allergies  Allergen Reactions   Iohexol  Hives     Code: HIVES, Desc: PER [name] @ [facility], PT IS ALLERGIC TO CONTRAST DYE 10/02/08/RM  05/01/10...needs full premeds per our protocol w/ [facility]., Onset Date: [id number]    Iodinated Contrast Media Hives    Past Medical History, Surgical history, Social history, and Family History were reviewed and updated.  Review of Systems:  Review of Systems  Constitutional:  Positive for malaise/fatigue.  HENT: Negative.    Eyes: Negative.   Respiratory: Negative.    Cardiovascular: Negative.   Gastrointestinal: Negative.   Genitourinary: Negative.   Musculoskeletal: Negative.   Skin: Negative.   Neurological: Negative.   Endo/Heme/Allergies: Negative.   Psychiatric/Behavioral: Negative.       Physical Exam:  Vital signs show temperature of 97.8.  Pulse 56.  Blood pressure 150/54.  Weight is 182 pounds.    Wt Readings from Last 3 Encounters:  07/13/24 182 lb (82.6 kg)  06/27/24 182 lb (82.6 kg)  06/22/24 179 lb (81.2 kg)    Physical Exam Vitals reviewed.  HENT:     Head: Normocephalic and atraumatic.  Eyes:     Pupils: Pupils are equal, round, and reactive to light.  Cardiovascular:     Rate and Rhythm: Normal rate and regular rhythm.     Heart sounds: Normal heart sounds.  Pulmonary:     Effort: Pulmonary effort is normal.     Breath sounds: Normal breath sounds.  Abdominal:     General: Bowel sounds are normal.     Palpations: Abdomen is soft.  Musculoskeletal:        General: No tenderness or deformity. Normal range of motion.     Cervical back: Normal range of motion.  Lymphadenopathy:     Cervical: No cervical adenopathy.  Skin:     General: Skin is warm and dry.     Findings: No erythema or rash.  Neurological:     Mental Status: He is alert and oriented to person, place, and time.  Psychiatric:        Behavior: Behavior normal.        Thought Content: Thought content normal.        Judgment: Judgment normal.      Lab Results  Component Value Date   WBC 4.9 07/13/2024   HGB 10.7 (L) 07/13/2024   HCT 33.2 (L) 07/13/2024  MCV 96.5 07/13/2024   PLT 139 (L) 07/13/2024   Lab Results  Component Value Date   FERRITIN 288 06/21/2024   IRON  59 06/21/2024   TIBC 305 06/21/2024   UIBC 246 06/21/2024   IRONPCTSAT 19 06/21/2024   Lab Results  Component Value Date   RETICCTPCT 1.4 07/13/2024   RBC 3.45 (L) 07/13/2024   RBC 3.44 (L) 07/13/2024   No results found for: KPAFRELGTCHN, LAMBDASER, KAPLAMBRATIO No results found for: IGGSERUM, IGA, IGMSERUM No results found for: STEPHANY CARLOTA BENSON MARKEL EARLA JOANNIE DOC VICK, SPEI   Chemistry      Component Value Date/Time   NA 142 07/13/2024 1024   NA 141 06/22/2022 0942   NA 144 10/04/2017 0904   NA 141 07/23/2016 1255   K 4.2 07/13/2024 1024   K 3.8 10/04/2017 0904   K 4.1 07/23/2016 1255   CL 104 07/13/2024 1024   CL 103 10/04/2017 0904   CO2 29 07/13/2024 1024   CO2 28 10/04/2017 0904   CO2 27 07/23/2016 1255   BUN 20 07/13/2024 1024   BUN 20 06/22/2022 0942   BUN 14 10/04/2017 0904   BUN 13.8 07/23/2016 1255   CREATININE 1.13 07/13/2024 1024   CREATININE 0.8 10/04/2017 0904   CREATININE 1.0 07/23/2016 1255      Component Value Date/Time   CALCIUM  9.5 07/13/2024 1024   CALCIUM  9.4 10/04/2017 0904   CALCIUM  9.2 07/23/2016 1255   ALKPHOS 64 07/13/2024 1024   ALKPHOS 64 10/04/2017 0904   ALKPHOS 77 07/23/2016 1255   AST 29 07/13/2024 1024   AST 23 07/23/2016 1255   ALT 13 07/13/2024 1024   ALT 22 10/04/2017 0904   ALT 13 07/23/2016 1255   BILITOT 0.7 07/13/2024 1024   BILITOT 1.13 07/23/2016  1255       Impression and Plan: Brian Mays is a very pleasant 78 yo caucasian gentleman with metastatic low-grade neuroendocrine carcinoma with hepatic metastasis and bone metastasis.    He will continue on his Somatuline.  Hopefully, we can see the Chromogranin A level come down.  His last PET scan was back in June.  We will have him set up with another 1.  Will plan to get him back in about a month or so.  He will not be able to get Aranesp as his insurance will not approve unless the hemoglobin is below 10.   Maude JONELLE Crease, MD 9/12/202512:22 PM

## 2024-07-13 NOTE — Patient Instructions (Signed)
 Lanreotide Injection What is this medication? LANREOTIDE (lan REE oh tide) treats high levels of growth hormone (acromegaly). It is used when other therapies have not worked well enough or cannot be tolerated. It works by reducing the amount of growth hormone your body makes. This reduces symptoms and the risk of health problems caused by too much growth hormone, such as diabetes and heart disease. It may also be used to treat neuroendocrine tumors, a cancer of the cells that release hormones and other substances in your body. It works by slowing down the release of these substances from the cells. This slows tumor growth. It also decreases the symptoms of carcinoid syndrome, such as flushing or diarrhea. This medicine may be used for other purposes; ask your health care provider or pharmacist if you have questions. COMMON BRAND NAME(S): Somatuline [place] What should I tell my care team before I take this medication? They need to know if you have any of these conditions: Diabetes Gallbladder disease Heart disease Kidney disease Liver disease Pancreatic disease Thyroid disease An unusual or allergic reaction to lanreotide, other medications, foods, dyes, or preservatives Pregnant or trying to get pregnant Breastfeeding How should I use this medication? This medication is injected under the skin. It is given by your care team in a hospital or clinic setting. Talk to your care team about the use of this medication in children. Special care may be needed. Overdosage: If you think you have taken too much of this medicine contact a poison control center or emergency room at once. NOTE: This medicine is only for you. Do not share this medicine with others. What if I miss a dose? Keep appointments for follow-up doses. It is important not to miss your dose. Call your care team if you are unable to keep an appointment. What may interact with this medication? Bromocriptine Cyclosporine Certain  medications for blood pressure, heart disease, irregular heartbeat Certain medications for diabetes Quinidine Terfenadine This list may not describe all possible interactions. Give your health care provider a list of all the medicines, herbs, non-prescription drugs, or dietary supplements you use. Also tell them if you smoke, drink alcohol, or use illegal drugs. Some items may interact with your medicine. What should I watch for while using this medication? Visit your care team for regular checks on your progress. Tell your care team if your symptoms do not start to get better or if they get worse. Your condition will be monitored carefully while you are receiving this medication. You may need blood work while you are taking this medication. This medication may increase blood sugar. The risk may be higher in patients who already have diabetes. Ask your care team what you can do to lower your risk of diabetes while taking this medication. Talk to your care team if you wish to become pregnant or think you may be pregnant. This medication can cause serious birth defects. Do not breast-feed while taking this medication and for 6 months after stopping therapy. This medication may cause infertility. Talk to your care team if you are concerned about your fertility. What side effects may I notice from receiving this medication? Side effects that you should report to your care team as soon as possible: Allergic reactions--skin rash, itching, hives, swelling of the face, lips, tongue, or throat Gallbladder problems--severe stomach pain, nausea, vomiting, fever High blood sugar (hyperglycemia)--increased thirst or amount of urine, unusual weakness or fatigue, blurry vision Increase in blood pressure Low blood sugar (hypoglycemia)--pale, blue or purple  skin or lips, sweating, fussiness, rapid heartbeat, poor feeding, low body temperature Low thyroid levels (hypothyroidism)--unusual weakness or fatigue,  increased sensitivity to cold, constipation, hair loss, dry skin, weight gain, feelings of depression Oily or light-colored stools, diarrhea, bloating, weight loss Slow heartbeat--dizziness, feeling faint or lightheaded, confusion, trouble breathing, unusual weakness or fatigue Side effects that usually do not require medical attention (report these to your care team if they continue or are bothersome): Diarrhea Dizziness Headache Muscle spasms Nausea Pain, redness, or irritation at injection site Stomach pain This list may not describe all possible side effects. Call your doctor for medical advice about side effects. You may report side effects to FDA at 1-800-FDA-1088. Where should I keep my medication? This medication is given in a hospital or clinic. It will not be stored at home. NOTE: This sheet is a summary. It may not cover all possible information. If you have questions about this medicine, talk to your doctor, pharmacist, or health care provider.  2024 Elsevier/Gold Standard (2023-09-30 00:00:00)

## 2024-07-16 LAB — CHROMOGRANIN A: Chromogranin A (ng/mL): 170.4 ng/mL — ABNORMAL HIGH (ref 0.0–101.8)

## 2024-08-01 ENCOUNTER — Other Ambulatory Visit: Payer: Self-pay | Admitting: Physician Assistant

## 2024-08-06 ENCOUNTER — Other Ambulatory Visit: Payer: Self-pay

## 2024-08-06 DIAGNOSIS — D500 Iron deficiency anemia secondary to blood loss (chronic): Secondary | ICD-10-CM

## 2024-08-06 DIAGNOSIS — C7B8 Other secondary neuroendocrine tumors: Secondary | ICD-10-CM

## 2024-08-07 ENCOUNTER — Inpatient Hospital Stay

## 2024-08-07 ENCOUNTER — Inpatient Hospital Stay: Attending: Hematology & Oncology

## 2024-08-07 ENCOUNTER — Other Ambulatory Visit: Payer: Self-pay

## 2024-08-07 ENCOUNTER — Inpatient Hospital Stay: Admitting: Hematology & Oncology

## 2024-08-07 VITALS — BP 154/51 | HR 53 | Temp 98.0°F | Resp 16 | Ht 70.0 in | Wt 184.0 lb

## 2024-08-07 DIAGNOSIS — D500 Iron deficiency anemia secondary to blood loss (chronic): Secondary | ICD-10-CM

## 2024-08-07 DIAGNOSIS — C7B8 Other secondary neuroendocrine tumors: Secondary | ICD-10-CM

## 2024-08-07 DIAGNOSIS — D509 Iron deficiency anemia, unspecified: Secondary | ICD-10-CM | POA: Diagnosis present

## 2024-08-07 DIAGNOSIS — C7A8 Other malignant neuroendocrine tumors: Secondary | ICD-10-CM | POA: Insufficient documentation

## 2024-08-07 LAB — CMP (CANCER CENTER ONLY)
ALT: 12 U/L (ref 0–44)
AST: 22 U/L (ref 15–41)
Albumin: 4 g/dL (ref 3.5–5.0)
Alkaline Phosphatase: 57 U/L (ref 38–126)
Anion gap: 10 (ref 5–15)
BUN: 21 mg/dL (ref 8–23)
CO2: 26 mmol/L (ref 22–32)
Calcium: 9.6 mg/dL (ref 8.9–10.3)
Chloride: 105 mmol/L (ref 98–111)
Creatinine: 1.08 mg/dL (ref 0.61–1.24)
GFR, Estimated: >60 mL/min (ref >60)
Glucose, Bld: 103 mg/dL — ABNORMAL HIGH (ref 70–99)
Potassium: 4.9 mmol/L (ref 3.5–5.1)
Sodium: 141 mmol/L (ref 135–145)
Total Bilirubin: 0.6 mg/dL (ref 0.0–1.2)
Total Protein: 7.1 g/dL (ref 6.5–8.1)

## 2024-08-07 LAB — IRON AND TIBC
Iron: 61 ug/dL (ref 45–182)
Saturation Ratios: 21 % (ref 17.9–39.5)
TIBC: 295 ug/dL (ref 250–450)
UIBC: 234 ug/dL

## 2024-08-07 LAB — CBC WITH DIFFERENTIAL (CANCER CENTER ONLY)
Abs Immature Granulocytes: 0.05 K/uL (ref 0.00–0.07)
Basophils Absolute: 0 K/uL (ref 0.0–0.1)
Basophils Relative: 1 %
Eosinophils Absolute: 0.3 K/uL (ref 0.0–0.5)
Eosinophils Relative: 6 %
HCT: 34.6 % — ABNORMAL LOW (ref 39.0–52.0)
Hemoglobin: 11.1 g/dL — ABNORMAL LOW (ref 13.0–17.0)
Immature Granulocytes: 1 %
Lymphocytes Relative: 11 %
Lymphs Abs: 0.6 K/uL — ABNORMAL LOW (ref 0.7–4.0)
MCH: 31.1 pg (ref 26.0–34.0)
MCHC: 32.1 g/dL (ref 30.0–36.0)
MCV: 96.9 fL (ref 80.0–100.0)
Monocytes Absolute: 0.4 K/uL (ref 0.1–1.0)
Monocytes Relative: 7 %
Neutro Abs: 4 K/uL (ref 1.7–7.7)
Neutrophils Relative %: 74 %
Platelet Count: 126 K/uL — ABNORMAL LOW (ref 150–400)
RBC: 3.57 MIL/uL — ABNORMAL LOW (ref 4.22–5.81)
RDW: 15 % (ref 11.5–15.5)
WBC Count: 5.4 K/uL (ref 4.0–10.5)
nRBC: 0 % (ref 0.0–0.2)

## 2024-08-07 LAB — RETICULOCYTES
Immature Retic Fract: 18.8 % — ABNORMAL HIGH (ref 2.3–15.9)
RBC.: 3.51 MIL/uL — ABNORMAL LOW (ref 4.22–5.81)
Retic Count, Absolute: 44.9 K/uL (ref 19.0–186.0)
Retic Ct Pct: 1.3 % (ref 0.4–3.1)

## 2024-08-07 LAB — FERRITIN: Ferritin: 182 ng/mL (ref 24–336)

## 2024-08-07 MED ORDER — LANREOTIDE ACETATE 120 MG/0.5ML ~~LOC~~ SOLN
120.0000 mg | SUBCUTANEOUS | Status: DC
  Administered 2024-08-07: 120 mg via SUBCUTANEOUS
  Filled 2024-08-07: qty 120

## 2024-08-07 NOTE — Patient Instructions (Signed)
 Lanreotide Injection What is this medication? LANREOTIDE (lan REE oh tide) treats high levels of growth hormone (acromegaly). It is used when other therapies have not worked well enough or cannot be tolerated. It works by reducing the amount of growth hormone your body makes. This reduces symptoms and the risk of health problems caused by too much growth hormone, such as diabetes and heart disease. It may also be used to treat neuroendocrine tumors, a cancer of the cells that release hormones and other substances in your body. It works by slowing down the release of these substances from the cells. This slows tumor growth. It also decreases the symptoms of carcinoid syndrome, such as flushing or diarrhea. This medicine may be used for other purposes; ask your health care provider or pharmacist if you have questions. COMMON BRAND NAME(S): Somatuline [place] What should I tell my care team before I take this medication? They need to know if you have any of these conditions: Diabetes Gallbladder disease Heart disease Kidney disease Liver disease Pancreatic disease Thyroid disease An unusual or allergic reaction to lanreotide, other medications, foods, dyes, or preservatives Pregnant or trying to get pregnant Breastfeeding How should I use this medication? This medication is injected under the skin. It is given by your care team in a hospital or clinic setting. Talk to your care team about the use of this medication in children. Special care may be needed. Overdosage: If you think you have taken too much of this medicine contact a poison control center or emergency room at once. NOTE: This medicine is only for you. Do not share this medicine with others. What if I miss a dose? Keep appointments for follow-up doses. It is important not to miss your dose. Call your care team if you are unable to keep an appointment. What may interact with this medication? Bromocriptine Cyclosporine Certain  medications for blood pressure, heart disease, irregular heartbeat Certain medications for diabetes Quinidine Terfenadine This list may not describe all possible interactions. Give your health care provider a list of all the medicines, herbs, non-prescription drugs, or dietary supplements you use. Also tell them if you smoke, drink alcohol, or use illegal drugs. Some items may interact with your medicine. What should I watch for while using this medication? Visit your care team for regular checks on your progress. Tell your care team if your symptoms do not start to get better or if they get worse. Your condition will be monitored carefully while you are receiving this medication. You may need blood work while you are taking this medication. This medication may increase blood sugar. The risk may be higher in patients who already have diabetes. Ask your care team what you can do to lower your risk of diabetes while taking this medication. Talk to your care team if you wish to become pregnant or think you may be pregnant. This medication can cause serious birth defects. Do not breast-feed while taking this medication and for 6 months after stopping therapy. This medication may cause infertility. Talk to your care team if you are concerned about your fertility. What side effects may I notice from receiving this medication? Side effects that you should report to your care team as soon as possible: Allergic reactions--skin rash, itching, hives, swelling of the face, lips, tongue, or throat Gallbladder problems--severe stomach pain, nausea, vomiting, fever High blood sugar (hyperglycemia)--increased thirst or amount of urine, unusual weakness or fatigue, blurry vision Increase in blood pressure Low blood sugar (hypoglycemia)--pale, blue or purple  skin or lips, sweating, fussiness, rapid heartbeat, poor feeding, low body temperature Low thyroid levels (hypothyroidism)--unusual weakness or fatigue,  increased sensitivity to cold, constipation, hair loss, dry skin, weight gain, feelings of depression Oily or light-colored stools, diarrhea, bloating, weight loss Slow heartbeat--dizziness, feeling faint or lightheaded, confusion, trouble breathing, unusual weakness or fatigue Side effects that usually do not require medical attention (report these to your care team if they continue or are bothersome): Diarrhea Dizziness Headache Muscle spasms Nausea Pain, redness, or irritation at injection site Stomach pain This list may not describe all possible side effects. Call your doctor for medical advice about side effects. You may report side effects to FDA at 1-800-FDA-1088. Where should I keep my medication? This medication is given in a hospital or clinic. It will not be stored at home. NOTE: This sheet is a summary. It may not cover all possible information. If you have questions about this medicine, talk to your doctor, pharmacist, or health care provider.  2024 Elsevier/Gold Standard (2023-09-30 00:00:00)

## 2024-08-07 NOTE — Progress Notes (Signed)
 Hematology and Oncology Follow Up Visit  CHANC KERVIN [id number] [birth year] 78 y.o. 08/07/2024   Principle Diagnosis:  Metastatic low grade neuroendocrine tumor-hepatic metastases Iron  deficiency anemia Erythropoietin  deficient anemia   Past Diagnosis:  S/p yttrium-90 intrahepatic therapy - November 2016 Lutathera  (Lu 177) injection on 02/01/2018 - s/p cycle 4 SBRT to T6 lesion -- 11/06/2021   Current Therapy:        Somatuline 120 mg q 4 week-start on 08/01/2023  IV Iron  as indicated  --Venofer  given on 06/27/2023 Lutathera -retreatment -09/06/2023 -- s/p 2/2 cycles Aranesp 300 mcg SQ q. 3 weeks for hemoglobin less than 10   Interim History:  Mr. Bells is here today for follow-up.  He and his brother had a wonderful time out west.  They got back a week ago.  They had a great time.  They really enjoyed themselves.  They saw some family.  They saw some great torsed sites.  They ate well.  They slept well.  He gets his dotatate PET scan tomorrow.  We will see what that shows.  His last Chromogranin A level was 170.  He is not complain of any kind of back discomfort.  He has had no problems with nausea or vomiting.  He has had no problems with fever.  There is been no bleeding.  His hemoglobin has come up quite nicely with just 1 dose of Aranesp.  His last iron  studies that were done back in September showed a ferritin of 353 with iron  saturation of 18%.  Currently, I would say his performance status is probably ECOG 1.  Medications:  Allergies as of 08/07/2024       Reactions   Iohexol  Hives    Code: HIVES, Desc: PER [name] @ [facility], PT IS ALLERGIC TO CONTRAST DYE 10/02/08/RM  05/01/10...needs full premeds per our protocol w/ [facility]., Onset Date: 87977990   Iodinated Contrast Media Hives        Medication List        Accurate as of August 07, 2024 11:42 AM. If you have any questions, ask your nurse or doctor.          amLODipine  2.5 MG tablet Commonly known  as: NORVASC  Take 1 tablet (2.5 mg total) by mouth daily.   aspirin  EC 81 MG tablet Take 81 mg by mouth every morning.   atorvastatin  80 MG tablet Commonly known as: LIPITOR TAKE 1 TABLET BY MOUTH EVERY DAY   Cholecalciferol  25 MCG (1000 UT) tablet Take 1,000 Units by mouth 2 (two) times daily.   clopidogrel  75 MG tablet Commonly known as: PLAVIX  TAKE 1 TABLET BY MOUTH EVERY DAY   cyanocobalamin  1000 MCG tablet Commonly known as: VITAMIN B12 Take 1,000 mcg by mouth every other day.   diphenhydramine -acetaminophen  25-500 MG Tabs tablet Commonly known as: TYLENOL  PM Take 1 tablet by mouth at bedtime as needed (sleep/pain).   fluticasone  50 MCG/ACT nasal spray Commonly known as: FLONASE  Place 2 sprays into both nostrils daily as needed (sinuses).   hydrocortisone  25 MG suppository Commonly known as: ANUSOL -HC INSERT 1 SUPPOSITORY RECTALLY TWICE A DAY AS NEEDED FOR HEMORRHOIDS   ipratropium 0.03 % nasal spray Commonly known as: ATROVENT  Place 2 sprays into the nose 3 (three) times daily as needed for rhinitis.   lansoprazole  30 MG capsule Commonly known as: PREVACID  TAKE 1 CAPSULE (30 MG TOTAL) BY MOUTH DAILY AT 12 NOON.   levothyroxine  50 MCG tablet Commonly known as: SYNTHROID  TAKE 1 TABLET BY MOUTH EVERY  DAY BEFORE BREAKFAST   LORazepam  0.5 MG tablet Commonly known as: Ativan  1 tab po 30 minutes prior to radiation or MRI scans   losartan  100 MG tablet Commonly known as: COZAAR  Take 1 tablet (100 mg total) by mouth daily.   methocarbamol  500 MG tablet Commonly known as: ROBAXIN  Take 1 tablet (500 mg total) by mouth every 8 (eight) hours as needed for muscle spasms.   metoprolol  tartrate 25 MG tablet Commonly known as: LOPRESSOR  TAKE 1/2 TABLET TWICE A DAY BY MOUTH What changed: See the new instructions.   nitroGLYCERIN  0.4 MG SL tablet Commonly known as: NITROSTAT  PLACE 1 TABLET UNDER THE TONGUE EVERY 5 (FIVE) MINUTES X 3 DOSES AS NEEDED FOR CHEST PAIN.    ranolazine  500 MG 12 hr tablet Commonly known as: RANEXA  TAKE 1 TABLET BY MOUTH TWICE DAILY . APPOINTMENT REQUIRED FOR FUTURE REFILLS   SOMATULINE DEPOT  [place] Inject 120 mcg into the skin every 28 (twenty-eight) days. Receives at Dr Jessy office   SOOTHE XP OP Place 1 drop into both eyes daily as needed (dry eyes).        Allergies:  Allergies  Allergen Reactions   Iohexol  Hives     Code: HIVES, Desc: PER [name] @ [facility], PT IS ALLERGIC TO CONTRAST DYE 10/02/08/RM  05/01/10...needs full premeds per our protocol w/ [facility]., Onset Date: [id number]    Iodinated Contrast Media Hives    Past Medical History, Surgical history, Social history, and Family History were reviewed and updated.  Review of Systems:  Review of Systems  Constitutional:  Positive for malaise/fatigue.  HENT: Negative.    Eyes: Negative.   Respiratory: Negative.    Cardiovascular: Negative.   Gastrointestinal: Negative.   Genitourinary: Negative.   Musculoskeletal: Negative.   Skin: Negative.   Neurological: Negative.   Endo/Heme/Allergies: Negative.   Psychiatric/Behavioral: Negative.       Physical Exam:  Vital signs show temperature of 98.  Pulse 53.  Blood pressure 154/51.  Weight is 184 pounds.    Wt Readings from Last 3 Encounters:  07/13/24 182 lb (82.6 kg)  06/27/24 182 lb (82.6 kg)  06/22/24 179 lb (81.2 kg)    Physical Exam Vitals reviewed.  HENT:     Head: Normocephalic and atraumatic.  Eyes:     Pupils: Pupils are equal, round, and reactive to light.  Cardiovascular:     Rate and Rhythm: Normal rate and regular rhythm.     Heart sounds: Normal heart sounds.  Pulmonary:     Effort: Pulmonary effort is normal.     Breath sounds: Normal breath sounds.  Abdominal:     General: Bowel sounds are normal.     Palpations: Abdomen is soft.  Musculoskeletal:        General: No tenderness or deformity. Normal range of motion.     Cervical back: Normal range of motion.   Lymphadenopathy:     Cervical: No cervical adenopathy.  Skin:    General: Skin is warm and dry.     Findings: No erythema or rash.  Neurological:     Mental Status: He is alert and oriented to person, place, and time.  Psychiatric:        Behavior: Behavior normal.        Thought Content: Thought content normal.        Judgment: Judgment normal.      Lab Results  Component Value Date   WBC 5.4 08/07/2024   HGB 11.1 (L) 08/07/2024  HCT 34.6 (L) 08/07/2024   MCV 96.9 08/07/2024   PLT 126 (L) 08/07/2024   Lab Results  Component Value Date   FERRITIN 353 (H) 07/13/2024   IRON  54 07/13/2024   TIBC 297 07/13/2024   UIBC 243 07/13/2024   IRONPCTSAT 18 07/13/2024   Lab Results  Component Value Date   RETICCTPCT 1.3 08/07/2024   RBC 3.57 (L) 08/07/2024   RBC 3.51 (L) 08/07/2024   No results found for: KPAFRELGTCHN, LAMBDASER, KAPLAMBRATIO No results found for: IGGSERUM, IGA, IGMSERUM No results found for: STEPHANY CARLOTA BENSON MARKEL EARLA JOANNIE DOC VICK, SPEI   Chemistry      Component Value Date/Time   NA 141 08/07/2024 1037   NA 141 06/22/2022 0942   NA 144 10/04/2017 0904   NA 141 07/23/2016 1255   K 4.9 08/07/2024 1037   K 3.8 10/04/2017 0904   K 4.1 07/23/2016 1255   CL 105 08/07/2024 1037   CL 103 10/04/2017 0904   CO2 26 08/07/2024 1037   CO2 28 10/04/2017 0904   CO2 27 07/23/2016 1255   BUN 21 08/07/2024 1037   BUN 20 06/22/2022 0942   BUN 14 10/04/2017 0904   BUN 13.8 07/23/2016 1255   CREATININE 1.08 08/07/2024 1037   CREATININE 0.8 10/04/2017 0904   CREATININE 1.0 07/23/2016 1255      Component Value Date/Time   CALCIUM  9.6 08/07/2024 1037   CALCIUM  9.4 10/04/2017 0904   CALCIUM  9.2 07/23/2016 1255   ALKPHOS 57 08/07/2024 1037   ALKPHOS 64 10/04/2017 0904   ALKPHOS 77 07/23/2016 1255   AST 22 08/07/2024 1037   AST 23 07/23/2016 1255   ALT 12 08/07/2024 1037   ALT 22 10/04/2017 0904   ALT 13  07/23/2016 1255   BILITOT 0.6 08/07/2024 1037   BILITOT 1.13 07/23/2016 1255       Impression and Plan: Mr. Harner is a very pleasant 78 yo caucasian gentleman with metastatic low-grade neuroendocrine carcinoma with hepatic metastasis and bone metastasis.    He will continue on his Somatuline.  Hopefully, we can see the Chromogranin A level come down.  I will be interesting to see what the PET scan shows.  Hopefully, we will not have to make any changes with his protocol and have to give him some more aggressive.  His quality of life is doing quite well right now.  I am happy about that for him.  Will plan to get him back in another month.   Maude JONELLE Crease, MD 10/7/202511:42 AM

## 2024-08-08 ENCOUNTER — Ambulatory Visit (HOSPITAL_COMMUNITY)

## 2024-08-08 ENCOUNTER — Encounter (HOSPITAL_COMMUNITY)
Admission: RE | Admit: 2024-08-08 | Discharge: 2024-08-08 | Disposition: A | Discharge status: Home / Self Care | Source: Ambulatory Visit | Attending: Hematology & Oncology | Admitting: Hematology & Oncology

## 2024-08-08 ENCOUNTER — Ambulatory Visit (HOSPITAL_COMMUNITY): Admission: RE | Admit: 2024-08-08 | Source: Ambulatory Visit

## 2024-08-08 DIAGNOSIS — K769 Liver disease, unspecified: Secondary | ICD-10-CM | POA: Insufficient documentation

## 2024-08-08 LAB — CHROMOGRANIN A: Chromogranin A (ng/mL): 180.1 ng/mL — ABNORMAL HIGH (ref 0.0–101.8)

## 2024-08-09 ENCOUNTER — Telehealth: Payer: Self-pay | Admitting: Family

## 2024-08-09 DIAGNOSIS — I352 Nonrheumatic aortic (valve) stenosis with insufficiency: Secondary | ICD-10-CM | POA: Diagnosis not present

## 2024-08-09 DIAGNOSIS — E7800 Pure hypercholesterolemia, unspecified: Secondary | ICD-10-CM | POA: Diagnosis not present

## 2024-08-09 DIAGNOSIS — C7A098 Malignant carcinoid tumors of other sites: Secondary | ICD-10-CM | POA: Diagnosis not present

## 2024-08-09 DIAGNOSIS — I252 Old myocardial infarction: Secondary | ICD-10-CM | POA: Diagnosis not present

## 2024-08-09 DIAGNOSIS — I10 Essential (primary) hypertension: Secondary | ICD-10-CM | POA: Diagnosis not present

## 2024-08-09 DIAGNOSIS — Z1330 Encounter for screening examination for mental health and behavioral disorders, unspecified: Secondary | ICD-10-CM | POA: Diagnosis not present

## 2024-08-09 NOTE — Telephone Encounter (Signed)
 Called to r/s 11/5 appts as injection needs to be pushed out to sometime after scheduled PET scan. LVM to return call for scheduling.

## 2024-08-10 NOTE — Progress Notes (Addendum)
 [facility] [address], [address] [place], [place] [address] [phone]  Date:  08/15/2024   Name:  Brian Mays   DOB:  January 01, 1946   MRN:  [id number]  PCP:  [name], MD    Chief Complaint: Annual Exam (Labs /Flu shot )   History of Present Illness:  Brian Mays is a 78 y.o. very pleasant male patient who presents with the following:  Patient seen today for a physical.  I saw him most recently in August - history of metastatic malignant neuroendocrine tumor in the liver/carcinoid syndrome status post partial small bowel resection in 2010, as well as iron  malabsorption, CAD status post stenting in 2017, hypothyroidism, hypertension, chronic pancreatitis   Most recent PET scan from June shows unchanged skeletal mets  Most recent visit with hematology/oncology was in October Principle Diagnosis:  Metastatic low grade neuroendocrine tumor-hepatic metastases Iron  deficiency anemia Erythropoietin  deficient anemia Past Diagnosis:  S/p yttrium-90 intrahepatic therapy - November 2016 Lutathera  (Lu 177) injection on 02/01/2018 - s/p cycle 4 SBRT to T6 lesion -- 11/06/2021 Current Therapy:        Somatuline 120 mg q 4 week-start on 08/01/2023  IV Iron  as indicated  --Venofer  given on 06/27/2023 Lutathera -retreatment -09/06/2023 -- s/p 2/2 cycles Aranesp 300 mcg SQ q. 3 weeks for hemoglobin less than 10  - Flu vaccine; give today  -recommend COVID booster  Lab work on chart from earlier this month, CMP, iron  studies, CBC  His previous cardiologist retired, he recently establish with a new cardiologist at [facility], Dr. Crawford- Assessment and Plan  1. Coronary artery disease involving native coronary artery of native heart without angina pectoris (Primary) - ECG 12 lead - TSH; Future - Lipid Panel; Future - Echocardiogram Complete WO Enhancing Agent; Future 2. Primary hypertension 3. Pure hypercholesterolemia -  TSH; Future - Lipid Panel; Future 4. Malignant carcinoid tumor of other sites (*) 5. Nonrheumatic aortic insufficiency with aortic stenosis - Echocardiogram Complete WO Enhancing Agent; Future 6. Old MI (myocardial infarction) - Echocardiogram Complete WO Enhancing Agent; Future Impression: 1. CAD stable. He is very active and has no symptoms. 2. Old MI stable. LV function has been normal in the past. No evidence of angina, heart failure or arrhythmia. 3. Hypertension-well-controlled with current regimen. Amlodipine  was recently added. 4. History of malignant carcinoid tumor with liver and bony metastasis. He is followed closely by oncology and reportedly has been doing well. 5. Aortic insufficiency by history. He has a significant systolic murmur. His last echo was 2-1/2 years ago. 6. Hyperlipidemia-the last lipid panel I can find was quite a while back and his LDL was less than 30. Plan: 1. Echocardiogram 2. Lipid panel and TSH. If his LDL remains tremendously low then we might back off on the atorvastatin  just a bit. 3. I note that he has remained on dual antiplatelet therapy for years. I would like to see him back in 6 months. If he is stable at that point, then I would consider changing to single antiplatelet therapy (probably Plavix  alone). Follow up in about 6 months (around 02/07/2025).  Discussed the use of AI scribe software for clinical note transcription with the patient, who gave verbal consent to proceed.  History of Present Illness Brian Mays is a 78 year old male with cancer and hypertension who presents for a follow-up visit.  He has been doing well, and his oncology follow-up earlier this month  was completed. He receives treatment once a month and has completed all radiation therapy. No pain is reported, and there is significant improvement in his back problems, leading to the cancellation of a previously scheduled MRI. He also notes improved balance and has not  experienced any recent falls. He attended physical therapy recently and felt that the therapist was impressed with his progress.  His current medications include amlodipine , Lipitor, Plavix , levothyroxine , losartan , metoprolol  (a whole pill twice a day), and Ranexa . No recent chest pain is reported.  He has not yet received his flu shot this year but is up to date with other vaccinations, including RSV, shingles, and pneumonia. His last colonoscopy was in 2017.-10-year recheck recommended  He recently returned from a 17-day trip with his brother, visiting locations such as [place], [place], and the [place]. He enjoyed the trip and attended a [employer].  He is scheduled for an echocardiogram in six months to further evaluate a heart murmur that has become more prominent  He notes he is feeling well, he is physically active.  No falls.  No chest pain              Patient Active Problem List   Diagnosis Date Noted   Metastatic cancer to spine (HCC) 02/08/2022   Prediabetes 09/03/2021   Near syncope 04/22/2020   Iron  deficiency anemia due to chronic blood loss 03/29/2019   Iron  malabsorption 03/29/2019   Lumbar radiculopathy 08/16/2017   CAD (coronary artery disease) 09/21/2016   Unstable angina (HCC) 02/09/2016   Vitamin D  deficiency 03/11/2015   Metastatic malignant neuroendocrine tumor to liver (HCC) 02/14/2015   Hyperglycemia 04/03/2012   Diarrhea 03/29/2012   B12 deficiency 04/19/2011   Antiplatelet or antithrombotic long-term use 03/30/2011   TOBACCO USE, QUIT 10/13/2009   Unspecified vitamin D  deficiency 06/19/2009   Neoplasm by body site 12/23/2008   DIVERTICULOSIS, COLON 11/08/2008   ABDOMINAL PAIN 10/02/2008   Hypothyroidism 12/13/2007   HEMORRHOIDS, NOS 12/13/2007   Belching 12/13/2007   History of colonic polyps 12/13/2007   Essential hypertension 09/15/2007   Gastroesophageal reflux disease 09/15/2007   PANCREATITIS, CHRONIC 09/15/2007   CARDIAC MURMUR  09/15/2007   SNORING 09/15/2007    Past Medical History:  Diagnosis Date   Anemia    in past   Cataract    Colon polyps    Diverticulosis    Gallstones    GERD (gastroesophageal reflux disease)    Heart attack (HCC)    mild, Spring 2017   Heart murmur    Hemorrhoid    Hiatal hernia    HTN (hypertension)    Hyperlipidemia    Hypothyroidism    Iron  deficiency anemia due to chronic blood loss 03/29/2019   Iron  malabsorption 03/29/2019   Metastatic carcinoma (HCC) 2010   Dr Frank   Neuroendocrine cancer [facility]) 08/2021   Pancreatitis 1998   chronic    Past Surgical History:  Procedure Laterality Date   APPENDECTOMY  1962   CARDIAC CATHETERIZATION N/A 02/10/2016   Procedure: Left Heart Cath and Coronary Angiography;  Surgeon: Gordy Bergamo, MD;  Location: [facility] INVASIVE CV LAB;  Service: Cardiovascular;  Laterality: N/A;   CARDIAC CATHETERIZATION  02/10/2016   Procedure: Coronary/Graft Atherectomy;  Surgeon: Gordy Bergamo, MD;  Location: [facility] INVASIVE CV LAB;  Service: Cardiovascular;;   CARDIAC CATHETERIZATION  02/10/2016   Procedure: Coronary Stent Intervention;  Surgeon: Gordy Bergamo, MD;  Location: [facility] INVASIVE CV LAB;  Service: Cardiovascular;;   CATARACT EXTRACTION W/ INTRAOCULAR  LENS  IMPLANT, BILATERAL Bilateral    CORONARY STENT INTERVENTION N/A 12/16/2021   Procedure: CORONARY STENT INTERVENTION;  Surgeon: Wendel Lurena POUR, MD;  Location: [facility] INVASIVE CV LAB;  Service: Cardiovascular;  Laterality: N/A;   IR GENERIC HISTORICAL  12/30/2015   IR RADIOLOGIST EVAL & MGMT 12/30/2015 Marcey Moan, MD [facility] INTERV RAD   IR GENERIC HISTORICAL  11/09/2016   IR RADIOLOGIST EVAL & MGMT 11/09/2016 Marcey Moan, MD [facility] INTERV RAD   IR RADIOLOGIST EVAL & MGMT  03/01/2017   IR RADIOLOGIST EVAL & MGMT  06/29/2017   LAPAROSCOPIC CHOLECYSTECTOMY  1999   LEFT HEART CATH AND CORONARY ANGIOGRAPHY N/A 12/16/2021   Procedure: LEFT HEART CATH AND CORONARY ANGIOGRAPHY;  Surgeon: Wendel Lurena POUR, MD;  Location:  [facility] INVASIVE CV LAB;  Service: Cardiovascular;  Laterality: N/A;   LIVER BIOPSY  2010   RADIOACTIVE SEED IMPLANT  X 3   to my liver   TUMOR EXCISION  01/2009   Carcinoil Resection    TUMOR REMOVAL     from small intestine    Social History   Tobacco Use   Smoking status: Former    Current packs/day: 0.00    Average packs/day: 1.5 packs/day for 38.2 years (57.4 ttl pk-yrs)    Types: Cigarettes    Start date: 09/25/1959    Quit date: 12/21/1997    Years since quitting: 26.6   Smokeless tobacco: Never  Vaping Use   Vaping status: Never Used  Substance Use Topics   Alcohol use: Yes    Alcohol/week: 3.0 - 4.0 standard drinks of alcohol    Types: 3 - 4 Cans of beer per week    Comment: 1-2 beers twice a week   Drug use: No    Family History  Problem Relation Age of Onset   Kidney disease Mother    Hyperlipidemia Mother    Hypertension Mother    COPD Father    Ulcerative colitis Daughter    Colon cancer Neg Hx    Esophageal cancer Neg Hx    Rectal cancer Neg Hx    Stomach cancer Neg Hx     Allergies  Allergen Reactions   Iohexol  Hives     Code: HIVES, Desc: PER [name] @ [facility], PT IS ALLERGIC TO CONTRAST DYE 10/02/08/RM  05/01/10...needs full premeds per our protocol w/ [facility]., Onset Date: 87977990    Iodinated Contrast Media Hives    Medication list has been reviewed and updated.  Current Outpatient Medications on File Prior to Visit  Medication Sig Dispense Refill   amLODipine  (NORVASC ) 2.5 MG tablet Take 1 tablet (2.5 mg total) by mouth daily. 90 tablet 1   Artificial Tear Solution (SOOTHE XP OP) Place 1 drop into both eyes daily as needed (dry eyes).     aspirin  EC 81 MG tablet Take 81 mg by mouth every morning.     atorvastatin  (LIPITOR) 80 MG tablet TAKE 1 TABLET BY MOUTH EVERY DAY 90 tablet 1   Cholecalciferol  1000 UNITS tablet Take 1,000 Units by mouth 2 (two) times daily.     clopidogrel  (PLAVIX ) 75 MG tablet TAKE 1 TABLET BY MOUTH EVERY DAY 90  tablet 3   diphenhydramine -acetaminophen  (TYLENOL  PM) 25-500 MG TABS tablet Take 1 tablet by mouth at bedtime as needed (sleep/pain).     fluticasone  (FLONASE ) 50 MCG/ACT nasal spray Place 2 sprays into both nostrils daily as needed (sinuses).  11   hydrocortisone  (ANUSOL -HC) 25 MG suppository INSERT 1 SUPPOSITORY RECTALLY TWICE  A DAY AS NEEDED FOR HEMORRHOIDS 30 suppository 0   ipratropium (ATROVENT ) 0.03 % nasal spray Place 2 sprays into the nose 3 (three) times daily as needed for rhinitis. 30 mL 6   Lanreotide Acetate  (SOMATULINE DEPOT  [place]) Inject 120 mcg into the skin every 28 (twenty-eight) days. Receives at Dr Jessy office     lansoprazole  (PREVACID ) 30 MG capsule TAKE 1 CAPSULE (30 MG TOTAL) BY MOUTH DAILY AT 12 NOON. 90 capsule 1   levothyroxine  (SYNTHROID ) 50 MCG tablet TAKE 1 TABLET BY MOUTH EVERY DAY BEFORE BREAKFAST 90 tablet 1   LORazepam  (ATIVAN ) 0.5 MG tablet 1 tab po 30 minutes prior to radiation or MRI scans 5 tablet 0   losartan  (COZAAR ) 100 MG tablet Take 1 tablet (100 mg total) by mouth daily. 90 tablet 3   methocarbamol  (ROBAXIN ) 500 MG tablet Take 1 tablet (500 mg total) by mouth every 8 (eight) hours as needed for muscle spasms. 20 tablet 0   metoprolol  tartrate (LOPRESSOR ) 25 MG tablet TAKE 1/2 TABLET TWICE A DAY BY MOUTH (Patient taking differently: Take 12.5 mg by mouth 2 (two) times daily. Increased by Dr Timmy to 1 tablet daily due to elevated BPs.) 90 tablet 2   nitroGLYCERIN  (NITROSTAT ) 0.4 MG SL tablet PLACE 1 TABLET UNDER THE TONGUE EVERY 5 (FIVE) MINUTES X 3 DOSES AS NEEDED FOR CHEST PAIN. 25 tablet 0   ranolazine  (RANEXA ) 500 MG 12 hr tablet TAKE 1 TABLET BY MOUTH TWICE DAILY . APPOINTMENT REQUIRED FOR FUTURE REFILLS 60 tablet 0   vitamin B-12 (CYANOCOBALAMIN ) 1000 MCG tablet Take 1,000 mcg by mouth every other day.     Current Facility-Administered Medications on File Prior to Visit  Medication Dose Route Frequency Provider Last Rate Last Admin   lanreotide  acetate (SOMATULINE DEPOT ) injection 120 mg  120 mg Subcutaneous Q14 Days Ennever, Maude SAUNDERS, MD        Review of Systems:  As per HPI- otherwise negative.   Physical Examination: Vitals:   08/15/24 1534  BP: 134/70  Pulse: (!) 59  Temp: 98.1 F (36.7 C)  SpO2: 96%   Vitals:   08/15/24 1534  Weight: 184 lb 6.4 oz (83.6 kg)  Height: 5' 10 (1.778 m)   Body mass index is 26.46 kg/m. Ideal Body Weight: Weight in (lb) to have BMI = 25: 173.9  GEN: no acute distress.  Minimally overweight, looks well HEENT: Atraumatic, Normocephalic.  Bilateral TM wnl, oropharynx normal.  PEERL,EOMI.   Ears and Nose: No external deformity. CV: RRR, 2/6 harsh sounding systolic murmur.  No JVD. No thrill. No extra heart sounds. PULM: CTA B, no wheezes, crackles, rhonchi. No retractions. No resp. distress. No accessory muscle use. ABD: S, NT, ND. No rebound. No HSM. EXTR: No c/c/e PSYCH: Normally interactive. Conversant.    Assessment and Plan: Physical exam  Coronary artery disease involving native coronary artery of native heart without angina pectoris - Plan: Lipid panel  Iron  malabsorption  Hyperglycemia  Screening for diabetes mellitus - Plan: Hemoglobin A1c  Special screening, prostate cancer - Plan: PSA  Thyroid  disorder screening - Plan: TSH, CANCELED: TSH  Need for influenza vaccination - Plan: Flu vaccine HIGH DOSE PF(Fluzone Trivalent)  Assessment & Plan Back pain with thoracic spine lesions and history of bony metastases Back pain significantly improved, no current pain. - MRI canceled due to improvement.  Unsteadiness on feet Balance improved significantly, no recent falls.  Atherosclerotic heart disease of native coronary artery without angina Blood pressure well-controlled,  no recent chest pain. - Continue current regimen: amlodipine , Lipitor, Plavix , levothyroxine , losartan , metoprolol , and Ranexa . - Discuss with cardiologist about potentially discontinuing  Plavix .  Heart murmur Heart murmur present, previous echocardiogram showed no significant findings. - Schedule follow-up echocardiogram in six months.  General Health Maintenance Vaccinations up to date, last colonoscopy in 2017. - Administer flu shot today. - Order fasting labs for blood sugar, cholesterol, PSA, and thyroid .    Signed  Harlene Schroeder MD  Addendum 10/16, received labs as below.  Message to patient  Results for orders placed or performed in visit on 08/15/24  Hemoglobin A1c   Collection Time: 08/15/24  3:58 PM  Result Value Ref Range   Hgb A1c MFr Bld 6.1 4.6 - 6.5 %  Lipid panel   Collection Time: 08/15/24  3:58 PM  Result Value Ref Range   Cholesterol 93 0 - 200 mg/dL   Triglycerides 886.9 0.0 - 149.0 mg/dL   HDL 60.09 >60.99 mg/dL   VLDL 77.3 0.0 - 59.9 mg/dL   LDL Cholesterol 30 0 - 99 mg/dL   Total CHOL/HDL Ratio 2    NonHDL 52.99   PSA   Collection Time: 08/15/24  3:58 PM  Result Value Ref Range   PSA 0.29 0.10 - 4.00 ng/mL   *Note: Due to a large number of results and/or encounters for the requested time period, some results have not been displayed. A complete set of results can be found in Results Review.    "

## 2024-08-13 ENCOUNTER — Ambulatory Visit: Admitting: Physical Therapy

## 2024-08-13 DIAGNOSIS — R2689 Other abnormalities of gait and mobility: Secondary | ICD-10-CM

## 2024-08-13 DIAGNOSIS — M5459 Other low back pain: Secondary | ICD-10-CM

## 2024-08-13 NOTE — Therapy (Signed)
 OUTPATIENT PHYSICAL THERAPY LOWER EXTREMITY EVALUATION   Patient Name: Brian Mays MRN: [id number] DOB:Dec 14, 1945, 78 y.o., male Today's Date: 08/13/2024  END OF SESSION:  PT End of Session - 08/17/24 0919     Visit Number 1    Number of Visits 16    Date for Recertification  10/08/24    Authorization Type aetna medicare    PT Start Time [phone]    PT Stop Time 0930    PT Time Calculation (min) 40 min    Equipment Utilized During Treatment Gait belt    Activity Tolerance Patient tolerated treatment well    Behavior During Therapy [facility] for tasks assessed/performed          Past Medical History:  Diagnosis Date   Anemia    in past   Cataract    Colon polyps    Diverticulosis    Gallstones    GERD (gastroesophageal reflux disease)    Heart attack (HCC)    mild, Spring 2017   Heart murmur    Hemorrhoid    Hiatal hernia    HTN (hypertension)    Hyperlipidemia    Hypothyroidism    Iron  deficiency anemia due to chronic blood loss 03/29/2019   Iron  malabsorption 03/29/2019   Metastatic carcinoma (HCC) 2010   Dr Frank   Neuroendocrine cancer [facility]) 08/2021   Pancreatitis 1998   chronic   Past Surgical History:  Procedure Laterality Date   APPENDECTOMY  1962   CARDIAC CATHETERIZATION N/A 02/10/2016   Procedure: Left Heart Cath and Coronary Angiography;  Surgeon: Gordy Bergamo, MD;  Location: [facility] INVASIVE CV LAB;  Service: Cardiovascular;  Laterality: N/A;   CARDIAC CATHETERIZATION  02/10/2016   Procedure: Coronary/Graft Atherectomy;  Surgeon: Gordy Bergamo, MD;  Location: [facility] INVASIVE CV LAB;  Service: Cardiovascular;;   CARDIAC CATHETERIZATION  02/10/2016   Procedure: Coronary Stent Intervention;  Surgeon: Gordy Bergamo, MD;  Location: [facility] INVASIVE CV LAB;  Service: Cardiovascular;;   CATARACT EXTRACTION W/ INTRAOCULAR LENS  IMPLANT, BILATERAL Bilateral    CORONARY STENT INTERVENTION N/A 12/16/2021   Procedure: CORONARY STENT INTERVENTION;  Surgeon: Wendel Lurena POUR, MD;  Location:  [facility] INVASIVE CV LAB;  Service: Cardiovascular;  Laterality: N/A;   IR GENERIC HISTORICAL  12/30/2015   IR RADIOLOGIST EVAL & MGMT 12/30/2015 Marcey Moan, MD [facility] INTERV RAD   IR GENERIC HISTORICAL  11/09/2016   IR RADIOLOGIST EVAL & MGMT 11/09/2016 Marcey Moan, MD [facility] INTERV RAD   IR RADIOLOGIST EVAL & MGMT  03/01/2017   IR RADIOLOGIST EVAL & MGMT  06/29/2017   LAPAROSCOPIC CHOLECYSTECTOMY  1999   LEFT HEART CATH AND CORONARY ANGIOGRAPHY N/A 12/16/2021   Procedure: LEFT HEART CATH AND CORONARY ANGIOGRAPHY;  Surgeon: Wendel Lurena POUR, MD;  Location: [facility] INVASIVE CV LAB;  Service: Cardiovascular;  Laterality: N/A;   LIVER BIOPSY  2010   RADIOACTIVE SEED IMPLANT  X 3   to my liver   TUMOR EXCISION  01/2009   Carcinoil Resection    TUMOR REMOVAL     from small intestine   Patient Active Problem List   Diagnosis Date Noted   Metastatic cancer to spine (HCC) 02/08/2022   Prediabetes 09/03/2021   Near syncope 04/22/2020   Iron  deficiency anemia due to chronic blood loss 03/29/2019   Iron  malabsorption 03/29/2019   Lumbar radiculopathy 08/16/2017   CAD (coronary artery disease) 09/21/2016   Unstable angina (HCC) 02/09/2016   Vitamin D  deficiency 03/11/2015   Metastatic malignant neuroendocrine tumor to liver (HCC)  02/14/2015   Hyperglycemia 04/03/2012   Diarrhea 03/29/2012   B12 deficiency 04/19/2011   Antiplatelet or antithrombotic long-term use 03/30/2011   TOBACCO USE, QUIT 10/13/2009   Unspecified vitamin D  deficiency 06/19/2009   Neoplasm by body site 12/23/2008   DIVERTICULOSIS, COLON 11/08/2008   ABDOMINAL PAIN 10/02/2008   Hypothyroidism 12/13/2007   HEMORRHOIDS, NOS 12/13/2007   Belching 12/13/2007   History of colonic polyps 12/13/2007   Essential hypertension 09/15/2007   Gastroesophageal reflux disease 09/15/2007   PANCREATITIS, CHRONIC 09/15/2007   CARDIAC MURMUR 09/15/2007   SNORING 09/15/2007     PCP: Harlene Copland  REFERRING PROVIDER: Harlene  Copland  REFERRING DIAG: Balance   THERAPY DIAG:  Other abnormalities of gait and mobility  Other low back pain  Rationale for Evaluation and Treatment: Rehabilitation  ONSET DATE:    SUBJECTIVE:   SUBJECTIVE STATEMENT: PT states Previous back pain- from lifting bags of concrete.  Also got a new mattress which he thinks helped. Did have MRI scheduled, but cancelled it, because it got a bit better on its own. He had a lot of pain in the morning, then got better as the day went on. Still hurts at times, with a lot of bending.  He also feels like his balance has improved since he mentioned it to the doctor.  Balance: no AD. Stairs: yes, bedroom on 1st floor, But has to go up 5 steps to enter house. 2 hand rails. Lives alone.  Exercise: walks outside, [place] or trail in woods, up to 3 mi, about 1-2/wk.  and uses mountain bike  Does have walking stick. Likes to play golf 1-2 x/wk, states no problem with balance with golf, rides in cart.  Cancer: gets injection every 4 weeks  Denies dizziness.    PERTINENT HISTORY: Anemic, low hemoglobin, multiple stents,   PAIN:  Are you having pain? Yes: NPRS scale: 0-1 /10 Pain location: Low back  Pain description: sore Aggravating factors: bending, increased activity  Relieving factors: rest  PRECAUTIONS: Fall  WEIGHT BEARING RESTRICTIONS: No  FALLS:  Has patient fallen in last 6 months? Yes. Number of falls 1 1 fall out in garden in loose soil.  No other falls, but a few near falls. Mostly inside   PLOF: Independent  PATIENT GOALS:  Decreased pain in back, improve balance   NEXT MD VISIT:   OBJECTIVE:   DIAGNOSTIC FINDINGS:   PATIENT SURVEYS:    COGNITION: Overall cognitive status: Within functional limits for tasks assessed     SENSATION: WFL  EDEMA:    POSTURE:    No Significant postural limitations  PALPATION:   LOWER EXTREMITY ROM: WFL  Active /Passive ROM Left eval Right eval  Hip flexion    Hip  extension    Hip abduction    Hip adduction    Hip internal rotation    Hip external rotation    Knee flexion    Knee extension    Ankle dorsiflexion    Ankle plantarflexion    Ankle inversion    Ankle eversion     (Blank rows = not tested)  LOWER EXTREMITY MMT:  MMT Left eval Right  eval  Hip flexion 4 4  Hip extension    Hip abduction    Hip adduction    Hip internal rotation    Hip external rotation    Knee flexion 4 4  Knee extension 4 4  Ankle dorsiflexion    Ankle plantarflexion    Ankle inversion  Ankle eversion     (Blank rows = not tested)  LOWER EXTREMITY SPECIAL TESTS:     FUNCTIONAL TESTS:   5 time sit to stand: no hands:  14.65 sec  TUG:  10.97 sec DGI:      17      [facility] PT Assessment - 08/17/24 0001       Standardized Balance Assessment   Standardized Balance Assessment Dynamic Gait Index      Dynamic Gait Index   Level Surface Normal    Change in Gait Speed Normal    Gait with Horizontal Head Turns Mild Impairment    Gait with Vertical Head Turns Mild Impairment    Gait and Pivot Turn Moderate Impairment    Step Over Obstacle Mild Impairment    Step Around Obstacles Mild Impairment    Steps Mild Impairment    Total Score 17           GAIT:    TODAY'S TREATMENT:                                                                                                                              DATE:  Eval only. Will issue HEP next visit     PATIENT EDUCATION:  Education details: PT POC, Exam findings, HEP Person educated: Patient Education method: Explanation, Demonstration, Tactile cues, Verbal cues, and Handouts Education comprehension: verbalized understanding, returned demonstration, verbal cues required, tactile cues required, and needs further education   HOME EXERCISE PROGRAM: Will issue next visit   ASSESSMENT:  CLINICAL IMPRESSION: Patient presents with primary complaint of  reduced mobility, with decreased  balance and back pain. Both back pain and balance have improved some since initial MD appt, but he does have deficits with both. He will benefit from education on HEP for back mobility and strength. He has deficits with testing for dynamic stability and gait, and will benefit from improving safety with dynamic activity to improve confidence and reduce fall risk.  Pt with decreased ability for full functional activities. Pt will  benefit from skilled PT to improve deficits and pain and to return to PLOF.   OBJECTIVE IMPAIRMENTS: Abnormal gait, decreased activity tolerance, decreased balance, decreased mobility, decreased strength, increased muscle spasms, improper body mechanics, and pain.   ACTIVITY LIMITATIONS: carrying, lifting, bending, standing, squatting, stairs, and locomotion level  PARTICIPATION LIMITATIONS: cleaning, shopping, community activity, and yard work  PERSONAL FACTORS: Past/current experiences and Time since onset of injury/illness/exacerbation are also affecting patient's functional outcome.   REHAB POTENTIAL: Good  CLINICAL DECISION MAKING: Stable/uncomplicated  EVALUATION COMPLEXITY: Low   GOALS: Goals reviewed with patient? Yes  SHORT TERM GOALS: Target date: 08/27/2024   Pt to be independent with initial HEP  Goal status: INITIAL   LONG TERM GOALS: Target date: 10/08/2024   Pt to be independent with final HEP  Goal status: INITIAL  2.  Pt to demo ability and optimal mechanics for bend, lift, squat, to  improve ability and pain with IADLs.  Goal status: INITIAL  3.  Pt to demo dynamic balance to be [facility] for pt age, to reduce fall risk.   Goal status: INITIAL  4.  Pt to demo improved score on DGI by at least 3 points.    Goal status: INITIAL     PLAN:  PT FREQUENCY: 1-2x/week  PT DURATION: 8 weeks  PLANNED INTERVENTIONS: Therapeutic exercises, Therapeutic activity, Neuromuscular re-education, Patient/Family education, Self Care, Joint  mobilization, Joint manipulation, Stair training, Orthotic/Fit training, DME instructions, Aquatic Therapy, Dry Needling, Electrical stimulation, Cryotherapy, Moist heat, Taping, Ultrasound, Ionotophoresis 4mg /ml Dexamethasone , Manual therapy,  Vasopneumatic device, Traction, Spinal manipulation, Spinal mobilization,Balance training, Gait training,   PLAN FOR NEXT SESSION: dynamic balance, LE strength for HEP , backc mobility    Tinnie Don, PT, DPT 9:21 AM  08/17/24

## 2024-08-15 ENCOUNTER — Ambulatory Visit (INDEPENDENT_AMBULATORY_CARE_PROVIDER_SITE_OTHER): Admitting: Family Medicine

## 2024-08-15 ENCOUNTER — Encounter: Payer: Self-pay | Admitting: Family Medicine

## 2024-08-15 VITALS — BP 134/70 | HR 59 | Temp 98.1°F | Ht 70.0 in | Wt 184.4 lb

## 2024-08-15 DIAGNOSIS — Z23 Encounter for immunization: Secondary | ICD-10-CM | POA: Diagnosis not present

## 2024-08-15 DIAGNOSIS — R739 Hyperglycemia, unspecified: Secondary | ICD-10-CM | POA: Diagnosis not present

## 2024-08-15 DIAGNOSIS — Z125 Encounter for screening for malignant neoplasm of prostate: Secondary | ICD-10-CM

## 2024-08-15 DIAGNOSIS — Z131 Encounter for screening for diabetes mellitus: Secondary | ICD-10-CM

## 2024-08-15 DIAGNOSIS — Z0000 Encounter for general adult medical examination without abnormal findings: Secondary | ICD-10-CM | POA: Diagnosis not present

## 2024-08-15 DIAGNOSIS — K909 Intestinal malabsorption, unspecified: Secondary | ICD-10-CM | POA: Diagnosis not present

## 2024-08-15 DIAGNOSIS — I2510 Atherosclerotic heart disease of native coronary artery without angina pectoris: Secondary | ICD-10-CM

## 2024-08-15 DIAGNOSIS — Z1329 Encounter for screening for other suspected endocrine disorder: Secondary | ICD-10-CM

## 2024-08-16 ENCOUNTER — Encounter: Payer: Self-pay | Admitting: Family Medicine

## 2024-08-16 LAB — LIPID PANEL
Cholesterol: 93 mg/dL (ref 0–200)
HDL: 39.9 mg/dL (ref >39.00)
LDL Cholesterol: 30 mg/dL (ref 0–99)
NonHDL: 52.99
Total CHOL/HDL Ratio: 2
Triglycerides: 113 mg/dL (ref 0.0–149.0)
VLDL: 22.6 mg/dL (ref 0.0–40.0)

## 2024-08-16 LAB — HEMOGLOBIN A1C: Hgb A1c MFr Bld: 6.1 % (ref 4.6–6.5)

## 2024-08-16 LAB — PSA: PSA: 0.29 ng/mL (ref 0.10–4.00)

## 2024-08-17 ENCOUNTER — Encounter: Payer: Self-pay | Admitting: Physical Therapy

## 2024-08-23 ENCOUNTER — Encounter: Admitting: Physical Therapy

## 2024-08-27 NOTE — Progress Notes (Signed)
 BERDELL HOSTETLER                                          MRN: [id number]   08/27/2024   The [facility] Quality Team Specialist reviewed this patient medical record for the purposes of chart review for care gap closure. The following were reviewed: abstraction for care gap closure-controlling blood pressure.    [facility] Quality Team

## 2024-08-30 ENCOUNTER — Encounter: Payer: Self-pay | Admitting: Physical Therapy

## 2024-08-30 ENCOUNTER — Ambulatory Visit: Admitting: Physical Therapy

## 2024-08-30 DIAGNOSIS — R2689 Other abnormalities of gait and mobility: Secondary | ICD-10-CM

## 2024-08-30 DIAGNOSIS — M5459 Other low back pain: Secondary | ICD-10-CM

## 2024-08-30 NOTE — Therapy (Signed)
 OUTPATIENT PHYSICAL THERAPY LOWER EXTREMITY TREATMENT   Patient Name: Brian Mays MRN: [id number] DOB:1946-03-17, 78 y.o., male Today's Date: 08/30/2024  END OF SESSION:  PT End of Session - 08/30/24 2227     Visit Number 2    Number of Visits 16    Date for Recertification  10/08/24    Authorization Type aetna medicare    PT Start Time 1021    PT Stop Time 1100    PT Time Calculation (min) 39 min    Equipment Utilized During Treatment Gait belt    Activity Tolerance Patient tolerated treatment well    Behavior During Therapy WFL for tasks assessed/performed           Past Medical History:  Diagnosis Date   Anemia    in past   Cataract    Colon polyps    Diverticulosis    Gallstones    GERD (gastroesophageal reflux disease)    Heart attack (HCC)    mild, Spring 2017   Heart murmur    Hemorrhoid    Hiatal hernia    HTN (hypertension)    Hyperlipidemia    Hypothyroidism    Iron  deficiency anemia due to chronic blood loss 03/29/2019   Iron  malabsorption 03/29/2019   Metastatic carcinoma (HCC) 2010   Dr Frank   Neuroendocrine cancer [facility]) 08/2021   Pancreatitis 1998   chronic   Past Surgical History:  Procedure Laterality Date   APPENDECTOMY  1962   CARDIAC CATHETERIZATION N/A 02/10/2016   Procedure: Left Heart Cath and Coronary Angiography;  Surgeon: Gordy Bergamo, MD;  Location: [facility] INVASIVE CV LAB;  Service: Cardiovascular;  Laterality: N/A;   CARDIAC CATHETERIZATION  02/10/2016   Procedure: Coronary/Graft Atherectomy;  Surgeon: Gordy Bergamo, MD;  Location: [facility] INVASIVE CV LAB;  Service: Cardiovascular;;   CARDIAC CATHETERIZATION  02/10/2016   Procedure: Coronary Stent Intervention;  Surgeon: Gordy Bergamo, MD;  Location: [facility] INVASIVE CV LAB;  Service: Cardiovascular;;   CATARACT EXTRACTION W/ INTRAOCULAR LENS  IMPLANT, BILATERAL Bilateral    CORONARY STENT INTERVENTION N/A 12/16/2021   Procedure: CORONARY STENT INTERVENTION;  Surgeon: Wendel Lurena POUR, MD;  Location:  [facility] INVASIVE CV LAB;  Service: Cardiovascular;  Laterality: N/A;   IR GENERIC HISTORICAL  12/30/2015   IR RADIOLOGIST EVAL & MGMT 12/30/2015 Marcey Moan, MD [facility] INTERV RAD   IR GENERIC HISTORICAL  11/09/2016   IR RADIOLOGIST EVAL & MGMT 11/09/2016 Marcey Moan, MD [facility] INTERV RAD   IR RADIOLOGIST EVAL & MGMT  03/01/2017   IR RADIOLOGIST EVAL & MGMT  06/29/2017   LAPAROSCOPIC CHOLECYSTECTOMY  1999   LEFT HEART CATH AND CORONARY ANGIOGRAPHY N/A 12/16/2021   Procedure: LEFT HEART CATH AND CORONARY ANGIOGRAPHY;  Surgeon: Wendel Lurena POUR, MD;  Location: [facility] INVASIVE CV LAB;  Service: Cardiovascular;  Laterality: N/A;   LIVER BIOPSY  2010   RADIOACTIVE SEED IMPLANT  X 3   to my liver   TUMOR EXCISION  01/2009   Carcinoil Resection    TUMOR REMOVAL     from small intestine   Patient Active Problem List   Diagnosis Date Noted   Metastatic cancer to spine (HCC) 02/08/2022   Prediabetes 09/03/2021   Near syncope 04/22/2020   Iron  deficiency anemia due to chronic blood loss 03/29/2019   Iron  malabsorption 03/29/2019   Lumbar radiculopathy 08/16/2017   CAD (coronary artery disease) 09/21/2016   Unstable angina (HCC) 02/09/2016   Vitamin D  deficiency 03/11/2015   Metastatic malignant neuroendocrine tumor to liver (  HCC) 02/14/2015   Hyperglycemia 04/03/2012   Diarrhea 03/29/2012   B12 deficiency 04/19/2011   Antiplatelet or antithrombotic long-term use 03/30/2011   TOBACCO USE, QUIT 10/13/2009   Unspecified vitamin D  deficiency 06/19/2009   Neoplasm by body site 12/23/2008   DIVERTICULOSIS, COLON 11/08/2008   ABDOMINAL PAIN 10/02/2008   Hypothyroidism 12/13/2007   HEMORRHOIDS, NOS 12/13/2007   Belching 12/13/2007   History of colonic polyps 12/13/2007   Essential hypertension 09/15/2007   Gastroesophageal reflux disease 09/15/2007   PANCREATITIS, CHRONIC 09/15/2007   CARDIAC MURMUR 09/15/2007   SNORING 09/15/2007     PCP: Harlene Copland  REFERRING PROVIDER: Harlene  Copland  REFERRING DIAG: Balance   THERAPY DIAG:  Other abnormalities of gait and mobility  Other low back pain  Rationale for Evaluation and Treatment: Rehabilitation  ONSET DATE:    SUBJECTIVE:   SUBJECTIVE STATEMENT: Pt with no new complaints.   Eval:  PT states Previous back pain- from lifting bags of concrete.  Also got a new mattress which he thinks helped. Did have MRI scheduled, but cancelled it, because it got a bit better on its own. He had a lot of pain in the morning, then got better as the day went on. Still hurts at times, with a lot of bending.  He also feels like his balance has improved since he mentioned it to the doctor.  Balance: no AD. Stairs: yes, bedroom on 1st floor, But has to go up 5 steps to enter house. 2 hand rails. Lives alone.  Exercise: walks outside, greenway or trail in woods, up to 3 mi, about 1-2/wk.  and uses mountain bike  Does have walking stick. Likes to play golf 1-2 x/wk, states no problem with balance with golf, rides in cart.  Cancer: gets injection every 4 weeks  Denies dizziness.    PERTINENT HISTORY: Anemic, low hemoglobin, multiple stents,   PAIN:  Are you having pain? Yes: NPRS scale: 0-1 /10 Pain location: Low back  Pain description: sore Aggravating factors: bending, increased activity  Relieving factors: rest  PRECAUTIONS: Fall  WEIGHT BEARING RESTRICTIONS: No  FALLS:  Has patient fallen in last 6 months? Yes. Number of falls 1 1 fall out in garden in loose soil.  No other falls, but a few near falls. Mostly inside   PLOF: Independent  PATIENT GOALS:  Decreased pain in back, improve balance   NEXT MD VISIT:   OBJECTIVE:   DIAGNOSTIC FINDINGS:   PATIENT SURVEYS:    COGNITION: Overall cognitive status: Within functional limits for tasks assessed     SENSATION: WFL  EDEMA:    POSTURE:    No Significant postural limitations  PALPATION:   LOWER EXTREMITY ROM: WFL  Active /Passive ROM Left eval  Right eval  Hip flexion    Hip extension    Hip abduction    Hip adduction    Hip internal rotation    Hip external rotation    Knee flexion    Knee extension    Ankle dorsiflexion    Ankle plantarflexion    Ankle inversion    Ankle eversion     (Blank rows = not tested)  LOWER EXTREMITY MMT:  MMT Left eval Right  eval  Hip flexion 4 4  Hip extension    Hip abduction    Hip adduction    Hip internal rotation    Hip external rotation    Knee flexion 4 4  Knee extension 4 4  Ankle dorsiflexion  Ankle plantarflexion    Ankle inversion    Ankle eversion     (Blank rows = not tested)  LOWER EXTREMITY SPECIAL TESTS:     FUNCTIONAL TESTS:   5 time sit to stand: no hands:  14.65 sec  TUG:  10.97 sec DGI:      17       GAIT:    TODAY'S TREATMENT:                                                                                                                              DATE:   08/30/2024 Therapeutic Exercise: education for HEP Aerobic: Supine: Seated: Standing: Stretches:   Seated HSS 20 sec x 4 bil;   LTR x 10;  SKTC 20 sec x 3 bil;  Neuromuscular Re-education: Side stepping at counter 10 ft x 8  Bwd walking in hallway x 4  Tandem walking in hallway  (difficult) x1  Tandem stance at counter 30 sec x 2 bil;  Toe taps on step- 1 riser x 25 Manual Therapy: Therapeutic Activity: Self Care:     PATIENT EDUCATION:  Education details: Initial HEP reviewed,  Person educated: Patient Education method: Explanation, Demonstration, Tactile cues, Verbal cues, and Handouts Education comprehension: verbalized understanding, returned demonstration, verbal cues required, tactile cues required, and needs further education   HOME EXERCISE PROGRAM: Access Code: [email or web address] URL: [email or web address] Date: 08/30/2024 Prepared by: Tinnie Don  Exercises - Supine Lower Trunk Rotation  - 1 x daily - 10 reps - 5 hold - Seated Hamstring Stretch   - 1-2 x daily - 3 reps - 20 hold - Hooklying Single Knee to Chest Stretch  - 2 x daily - 3 reps - 20 hold - Sit to Stand  - 1 x daily - 1 sets - 5- 10 reps  ASSESSMENT:  CLINICAL IMPRESSION: Pt with good tolerance/endurance for activity today, with little fatigue. He is challenged with standing/dynamic balance. He has mild lob- pt/therapist assisted with side stepping, bwd and tandem talking. Most challenged with tandem walk. Education on initial HEP for lumbar mobility. Plan to educate on LE strength for Hep next visit and continue balance.   Eval: Patient presents with primary complaint of  reduced mobility, with decreased balance and back pain. Both back pain and balance have improved some since initial MD appt, but he does have deficits with both. He will benefit from education on HEP for back mobility and strength. He has deficits with testing for dynamic stability and gait, and will benefit from improving safety with dynamic activity to improve confidence and reduce fall risk.  Pt with decreased ability for full functional activities. Pt will  benefit from skilled PT to improve deficits and pain and to return to PLOF.   OBJECTIVE IMPAIRMENTS: Abnormal gait, decreased activity tolerance, decreased balance, decreased mobility, decreased strength, increased muscle spasms, improper body mechanics, and pain.   ACTIVITY LIMITATIONS: carrying, lifting, bending, standing, squatting, stairs, and locomotion  level  PARTICIPATION LIMITATIONS: cleaning, shopping, community activity, and yard work  PERSONAL FACTORS: Past/current experiences and Time since onset of injury/illness/exacerbation are also affecting patient's functional outcome.   REHAB POTENTIAL: Good  CLINICAL DECISION MAKING: Stable/uncomplicated  EVALUATION COMPLEXITY: Low   GOALS: Goals reviewed with patient? Yes  SHORT TERM GOALS: Target date: 08/27/2024   Pt to be independent with initial HEP  Goal status:  INITIAL   LONG TERM GOALS: Target date: 10/08/2024   Pt to be independent with final HEP  Goal status: INITIAL  2.  Pt to demo ability and optimal mechanics for bend, lift, squat, to improve ability and pain with IADLs.  Goal status: INITIAL  3.  Pt to demo dynamic balance to be [facility] for pt age, to reduce fall risk.   Goal status: INITIAL  4.  Pt to demo improved score on DGI by at least 3 points.    Goal status: INITIAL     PLAN:  PT FREQUENCY: 1-2x/week  PT DURATION: 8 weeks  PLANNED INTERVENTIONS: Therapeutic exercises, Therapeutic activity, Neuromuscular re-education, Patient/Family education, Self Care, Joint mobilization, Joint manipulation, Stair training, Orthotic/Fit training, DME instructions, Aquatic Therapy, Dry Needling, Electrical stimulation, Cryotherapy, Moist heat, Taping, Ultrasound, Ionotophoresis 4mg /ml Dexamethasone , Manual therapy,  Vasopneumatic device, Traction, Spinal manipulation, Spinal mobilization,Balance training, Gait training,   PLAN FOR NEXT SESSION: dynamic balance, LE strength for HEP , backc mobility    Tinnie Don, PT, DPT 10:28 PM  08/30/24

## 2024-09-05 ENCOUNTER — Ambulatory Visit: Admitting: Family

## 2024-09-05 ENCOUNTER — Inpatient Hospital Stay

## 2024-09-05 ENCOUNTER — Ambulatory Visit: Payer: Self-pay | Admitting: Hematology & Oncology

## 2024-09-05 ENCOUNTER — Ambulatory Visit (HOSPITAL_COMMUNITY)
Admission: RE | Admit: 2024-09-05 | Discharge: 2024-09-05 | Disposition: A | Discharge status: Home / Self Care | Source: Ambulatory Visit | Attending: Hematology & Oncology | Admitting: Hematology & Oncology

## 2024-09-05 ENCOUNTER — Ambulatory Visit

## 2024-09-05 DIAGNOSIS — D509 Iron deficiency anemia, unspecified: Secondary | ICD-10-CM | POA: Diagnosis not present

## 2024-09-05 DIAGNOSIS — C7951 Secondary malignant neoplasm of bone: Secondary | ICD-10-CM | POA: Diagnosis not present

## 2024-09-05 DIAGNOSIS — C7A8 Other malignant neuroendocrine tumors: Secondary | ICD-10-CM | POA: Diagnosis not present

## 2024-09-05 DIAGNOSIS — K769 Liver disease, unspecified: Secondary | ICD-10-CM | POA: Diagnosis not present

## 2024-09-05 MED ORDER — COPPER CU 64 DOTATATE 1 MCI/ML IV SOLN
4.0000 | Freq: Once | INTRAVENOUS | Status: AC
  Administered 2024-09-05: 4.14 via INTRAVENOUS

## 2024-09-06 ENCOUNTER — Ambulatory Visit: Admitting: Physical Therapy

## 2024-09-06 ENCOUNTER — Encounter: Payer: Self-pay | Admitting: Physical Therapy

## 2024-09-06 DIAGNOSIS — M5459 Other low back pain: Secondary | ICD-10-CM | POA: Diagnosis not present

## 2024-09-06 DIAGNOSIS — R2689 Other abnormalities of gait and mobility: Secondary | ICD-10-CM

## 2024-09-06 NOTE — Therapy (Signed)
 OUTPATIENT PHYSICAL THERAPY LOWER EXTREMITY TREATMENT   Patient Name: Brian Mays MRN: [id number] DOB:[birth year], 78 y.o., male Today's Date: 09/06/2024   END OF SESSION:  PT End of Session - 09/06/24 0932     Visit Number 3    Number of Visits 16    Date for Recertification  10/08/24    Authorization Type aetna medicare    PT Start Time [phone]    PT Stop Time 1013    PT Time Calculation (min) 40 min    Equipment Utilized During Treatment Gait belt    Activity Tolerance Patient tolerated treatment well    Behavior During Therapy [facility] for tasks assessed/performed           Past Medical History:  Diagnosis Date   Anemia    in past   Cataract    Colon polyps    Diverticulosis    Gallstones    GERD (gastroesophageal reflux disease)    Heart attack (HCC)    mild, Spring 2017   Heart murmur    Hemorrhoid    Hiatal hernia    HTN (hypertension)    Hyperlipidemia    Hypothyroidism    Iron  deficiency anemia due to chronic blood loss 03/29/2019   Iron  malabsorption 03/29/2019   Metastatic carcinoma (HCC) 2010   Dr Frank   Neuroendocrine cancer [facility]) 08/2021   Pancreatitis 1998   chronic   Past Surgical History:  Procedure Laterality Date   APPENDECTOMY  1962   CARDIAC CATHETERIZATION N/A 02/10/2016   Procedure: Left Heart Cath and Coronary Angiography;  Surgeon: Gordy Bergamo, MD;  Location: [facility] INVASIVE CV LAB;  Service: Cardiovascular;  Laterality: N/A;   CARDIAC CATHETERIZATION  02/10/2016   Procedure: Coronary/Graft Atherectomy;  Surgeon: Gordy Bergamo, MD;  Location: [facility] INVASIVE CV LAB;  Service: Cardiovascular;;   CARDIAC CATHETERIZATION  02/10/2016   Procedure: Coronary Stent Intervention;  Surgeon: Gordy Bergamo, MD;  Location: [place] INVASIVE CV LAB;  Service: Cardiovascular;;   CATARACT EXTRACTION W/ INTRAOCULAR LENS  IMPLANT, BILATERAL Bilateral    CORONARY STENT INTERVENTION N/A 12/16/2021   Procedure: CORONARY STENT INTERVENTION;  Surgeon: Wendel Lurena POUR, MD;  Location:  [facility] INVASIVE CV LAB;  Service: Cardiovascular;  Laterality: N/A;   IR GENERIC HISTORICAL  12/30/2015   IR RADIOLOGIST EVAL & MGMT 12/30/2015 Marcey Moan, MD [facility] INTERV RAD   IR GENERIC HISTORICAL  11/09/2016   IR RADIOLOGIST EVAL & MGMT 11/09/2016 Marcey Moan, MD [facility] INTERV RAD   IR RADIOLOGIST EVAL & MGMT  03/01/2017   IR RADIOLOGIST EVAL & MGMT  06/29/2017   LAPAROSCOPIC CHOLECYSTECTOMY  1999   LEFT HEART CATH AND CORONARY ANGIOGRAPHY N/A 12/16/2021   Procedure: LEFT HEART CATH AND CORONARY ANGIOGRAPHY;  Surgeon: Wendel Lurena POUR, MD;  Location: [facility] INVASIVE CV LAB;  Service: Cardiovascular;  Laterality: N/A;   LIVER BIOPSY  2010   RADIOACTIVE SEED IMPLANT  X 3   to my liver   TUMOR EXCISION  01/2009   Carcinoil Resection    TUMOR REMOVAL     from small intestine   Patient Active Problem List   Diagnosis Date Noted   Metastatic cancer to spine (HCC) 02/08/2022   Prediabetes 09/03/2021   Near syncope 04/22/2020   Iron  deficiency anemia due to chronic blood loss 03/29/2019   Iron  malabsorption 03/29/2019   Lumbar radiculopathy 08/16/2017   CAD (coronary artery disease) 09/21/2016   Unstable angina (HCC) 02/09/2016   Vitamin D  deficiency 03/11/2015   Metastatic malignant neuroendocrine tumor to  liver (HCC) 02/14/2015   Hyperglycemia 04/03/2012   Diarrhea 03/29/2012   B12 deficiency 04/19/2011   Antiplatelet or antithrombotic long-term use 03/30/2011   TOBACCO USE, QUIT 10/13/2009   Unspecified vitamin D  deficiency 06/19/2009   Neoplasm by body site 12/23/2008   DIVERTICULOSIS, COLON 11/08/2008   ABDOMINAL PAIN 10/02/2008   Hypothyroidism 12/13/2007   HEMORRHOIDS, NOS 12/13/2007   Belching 12/13/2007   History of colonic polyps 12/13/2007   Essential hypertension 09/15/2007   Gastroesophageal reflux disease 09/15/2007   PANCREATITIS, CHRONIC 09/15/2007   CARDIAC MURMUR 09/15/2007   SNORING 09/15/2007     PCP: Harlene Copland  REFERRING PROVIDER: Harlene  Copland  REFERRING DIAG: Balance   THERAPY DIAG:  Other abnormalities of gait and mobility  Other low back pain  Rationale for Evaluation and Treatment: Rehabilitation  ONSET DATE:    SUBJECTIVE:   SUBJECTIVE STATEMENT: Pt with no new complaints. Has been using a pillow behind his back in his recliner, thinks it is helping back pain.   Eval:  PT states Previous back pain- from lifting bags of concrete.  Also got a new mattress which he thinks helped. Did have MRI scheduled, but cancelled it, because it got a bit better on its own. He had a lot of pain in the morning, then got better as the day went on. Still hurts at times, with a lot of bending.  He also feels like his balance has improved since he mentioned it to the doctor.  Balance: no AD. Stairs: yes, bedroom on 1st floor, But has to go up 5 steps to enter house. 2 hand rails. Lives alone.  Exercise: walks outside, [place] or trail in woods, up to 3 mi, about 1-2/wk.  and uses mountain bike  Does have walking stick. Likes to play golf 1-2 x/wk, states no problem with balance with golf, rides in cart.  Cancer: gets injection every 4 weeks  Denies dizziness.   Stairs: 4-5 to get in back (1 rail) ,  1 to get in front  PERTINENT HISTORY: Anemic, low hemoglobin, multiple stents,   PAIN:  Are you having pain? Yes: NPRS scale: 0-1 /10 Pain location: Low back  Pain description: sore Aggravating factors: bending, increased activity  Relieving factors: rest  PRECAUTIONS: Fall  WEIGHT BEARING RESTRICTIONS: No  FALLS:  Has patient fallen in last 6 months? Yes. Number of falls 1 1 fall out in garden in loose soil.  No other falls, but a few near falls. Mostly inside   PLOF: Independent  PATIENT GOALS:  Decreased pain in back, improve balance   NEXT MD VISIT:   OBJECTIVE:   DIAGNOSTIC FINDINGS:   PATIENT SURVEYS:    COGNITION: Overall cognitive status: Within functional limits for tasks  assessed     SENSATION: WFL  EDEMA:    POSTURE:    No Significant postural limitations  PALPATION:   LOWER EXTREMITY ROM: WFL  Active /Passive ROM Left eval Right eval  Hip flexion    Hip extension    Hip abduction    Hip adduction    Hip internal rotation    Hip external rotation    Knee flexion    Knee extension    Ankle dorsiflexion    Ankle plantarflexion    Ankle inversion    Ankle eversion     (Blank rows = not tested)  LOWER EXTREMITY MMT:  MMT Left eval Right  eval  Hip flexion 4 4  Hip extension    Hip abduction  Hip adduction    Hip internal rotation    Hip external rotation    Knee flexion 4 4  Knee extension 4 4  Ankle dorsiflexion    Ankle plantarflexion    Ankle inversion    Ankle eversion     (Blank rows = not tested)  LOWER EXTREMITY SPECIAL TESTS:     FUNCTIONAL TESTS:   5 time sit to stand: no hands:  14.65 sec  TUG:  10.97 sec DGI:      17       GAIT:    TODAY'S TREATMENT:                                                                                                                              DATE:   09/06/2024 Therapeutic Exercise: education for HEP Aerobic: Supine: Seated:  sit to stand x 10 mat table  Standing: hip abd 2 x 10 bil;  HR x 15,  Marching x 20  all at counter- education for HEP.  Stretches:   Seated HSS 20 sec x 4 bil;   Neuromuscular Re-education: Bwd walking in hallway x 4   Large step with fwd, bwd and lateral stepping x 10 ea bil;  Tandem stance at counter 30 sec x 1 bil; With head turns 2 x 10 bil;  SLS ( at counter) 5-10 sec x 3 bil; Manual Therapy: Therapeutic Activity: Self Care:     PATIENT EDUCATION:  Education details: Initial HEP reviewed,  Person educated: Patient Education method: Explanation, Demonstration, Tactile cues, Verbal cues, and Handouts Education comprehension: verbalized understanding, returned demonstration, verbal cues required, tactile cues required, and  needs further education   HOME EXERCISE PROGRAM: Access Code: [email or web address] URL: [email or web address] Date: 08/30/2024 Prepared by: Tinnie Don  Exercises - Supine Lower Trunk Rotation  - 1 x daily - 10 reps - 5 hold - Seated Hamstring Stretch  - 1-2 x daily - 3 reps - 20 hold - Hooklying Single Knee to Chest Stretch  - 2 x daily - 3 reps - 20 hold - Sit to Stand  - 1 x daily - 1 sets - 5- 10 reps  ASSESSMENT:  CLINICAL IMPRESSION: Pt with good tolerance/endurance for activity today, with little fatigue. He is challenged with dynamic balance, mostly with larger amplitude stepping/weight shifts and head turns. Will continue to benefit from progressive balance. Educated on LE strength for HEP.    Eval: Patient presents with primary complaint of  reduced mobility, with decreased balance and back pain. Both back pain and balance have improved some since initial MD appt, but he does have deficits with both. He will benefit from education on HEP for back mobility and strength. He has deficits with testing for dynamic stability and gait, and will benefit from improving safety with dynamic activity to improve confidence and reduce fall risk.  Pt with decreased ability for full functional activities. Pt will  benefit from skilled PT to improve deficits  and pain and to return to PLOF.   OBJECTIVE IMPAIRMENTS: Abnormal gait, decreased activity tolerance, decreased balance, decreased mobility, decreased strength, increased muscle spasms, improper body mechanics, and pain.   ACTIVITY LIMITATIONS: carrying, lifting, bending, standing, squatting, stairs, and locomotion level  PARTICIPATION LIMITATIONS: cleaning, shopping, community activity, and yard work  PERSONAL FACTORS: Past/current experiences and Time since onset of injury/illness/exacerbation are also affecting patient's functional outcome.   REHAB POTENTIAL: Good  CLINICAL DECISION MAKING: Stable/uncomplicated  EVALUATION  COMPLEXITY: Low   GOALS: Goals reviewed with patient? Yes  SHORT TERM GOALS: Target date: 08/27/2024   Pt to be independent with initial HEP  Goal status: INITIAL   LONG TERM GOALS: Target date: 10/08/2024   Pt to be independent with final HEP  Goal status: INITIAL  2.  Pt to demo ability and optimal mechanics for bend, lift, squat, to improve ability and pain with IADLs.  Goal status: INITIAL  3.  Pt to demo dynamic balance to be [facility] for pt age, to reduce fall risk.   Goal status: INITIAL  4.  Pt to demo improved score on DGI by at least 3 points.    Goal status: INITIAL     PLAN:  PT FREQUENCY: 1-2x/week  PT DURATION: 8 weeks  PLANNED INTERVENTIONS: Therapeutic exercises, Therapeutic activity, Neuromuscular re-education, Patient/Family education, Self Care, Joint mobilization, Joint manipulation, Stair training, Orthotic/Fit training, DME instructions, Aquatic Therapy, Dry Needling, Electrical stimulation, Cryotherapy, Moist heat, Taping, Ultrasound, Ionotophoresis 4mg /ml Dexamethasone , Manual therapy,  Vasopneumatic device, Traction, Spinal manipulation, Spinal mobilization,Balance training, Gait training,   PLAN FOR NEXT SESSION: dynamic balance, LE strength for HEP , backc mobility  Larger bos stepping, toe taps, stairs, tandem walk, head turns and eyes closed    Tinnie Don, PT, DPT 9:32 AM  09/06/24

## 2024-09-07 ENCOUNTER — Inpatient Hospital Stay: Admitting: Family

## 2024-09-07 ENCOUNTER — Inpatient Hospital Stay

## 2024-09-07 ENCOUNTER — Inpatient Hospital Stay: Attending: Hematology & Oncology

## 2024-09-07 VITALS — BP 120/48 | HR 64 | Temp 98.3°F | Resp 16 | Wt 187.0 lb

## 2024-09-07 DIAGNOSIS — C7B8 Other secondary neuroendocrine tumors: Secondary | ICD-10-CM | POA: Insufficient documentation

## 2024-09-07 DIAGNOSIS — D500 Iron deficiency anemia secondary to blood loss (chronic): Secondary | ICD-10-CM | POA: Diagnosis not present

## 2024-09-07 DIAGNOSIS — D509 Iron deficiency anemia, unspecified: Secondary | ICD-10-CM | POA: Diagnosis not present

## 2024-09-07 DIAGNOSIS — C7A8 Other malignant neuroendocrine tumors: Secondary | ICD-10-CM | POA: Diagnosis not present

## 2024-09-07 LAB — CBC WITH DIFFERENTIAL (CANCER CENTER ONLY)
Abs Immature Granulocytes: 0.02 K/uL (ref 0.00–0.07)
Basophils Absolute: 0 K/uL (ref 0.0–0.1)
Basophils Relative: 1 %
Eosinophils Absolute: 0.4 K/uL (ref 0.0–0.5)
Eosinophils Relative: 8 %
HCT: 32.8 % — ABNORMAL LOW (ref 39.0–52.0)
Hemoglobin: 10.7 g/dL — ABNORMAL LOW (ref 13.0–17.0)
Immature Granulocytes: 0 %
Lymphocytes Relative: 14 %
Lymphs Abs: 0.7 K/uL (ref 0.7–4.0)
MCH: 31.7 pg (ref 26.0–34.0)
MCHC: 32.6 g/dL (ref 30.0–36.0)
MCV: 97 fL (ref 80.0–100.0)
Monocytes Absolute: 0.5 K/uL (ref 0.1–1.0)
Monocytes Relative: 9 %
Neutro Abs: 3.4 K/uL (ref 1.7–7.7)
Neutrophils Relative %: 68 %
Platelet Count: 123 K/uL — ABNORMAL LOW (ref 150–400)
RBC: 3.38 MIL/uL — ABNORMAL LOW (ref 4.22–5.81)
RDW: 14.7 % (ref 11.5–15.5)
WBC Count: 5.1 K/uL (ref 4.0–10.5)
nRBC: 0 % (ref 0.0–0.2)

## 2024-09-07 LAB — CMP (CANCER CENTER ONLY)
ALT: 14 U/L (ref 0–44)
AST: 25 U/L (ref 15–41)
Albumin: 3.9 g/dL (ref 3.5–5.0)
Alkaline Phosphatase: 58 U/L (ref 38–126)
Anion gap: 9 (ref 5–15)
BUN: 17 mg/dL (ref 8–23)
CO2: 25 mmol/L (ref 22–32)
Calcium: 9.4 mg/dL (ref 8.9–10.3)
Chloride: 107 mmol/L (ref 98–111)
Creatinine: 0.97 mg/dL (ref 0.61–1.24)
GFR, Estimated: >60 mL/min (ref >60)
Glucose, Bld: 99 mg/dL (ref 70–99)
Potassium: 4.9 mmol/L (ref 3.5–5.1)
Sodium: 141 mmol/L (ref 135–145)
Total Bilirubin: 0.6 mg/dL (ref 0.0–1.2)
Total Protein: 6.8 g/dL (ref 6.5–8.1)

## 2024-09-07 LAB — FERRITIN: Ferritin: 255 ng/mL (ref 24–336)

## 2024-09-07 LAB — IRON AND IRON BINDING CAPACITY (CC-WL,HP ONLY)
Iron: 67 ug/dL (ref 45–182)
Saturation Ratios: 24 % (ref 17.9–39.5)
TIBC: 283 ug/dL (ref 250–450)
UIBC: 216 ug/dL

## 2024-09-07 LAB — RETICULOCYTES
Immature Retic Fract: 14.2 % (ref 2.3–15.9)
RBC.: 3.35 MIL/uL — ABNORMAL LOW (ref 4.22–5.81)
Retic Count, Absolute: 51.3 K/uL (ref 19.0–186.0)
Retic Ct Pct: 1.5 % (ref 0.4–3.1)

## 2024-09-07 MED ORDER — LANREOTIDE ACETATE 120 MG/0.5ML ~~LOC~~ SOLN
120.0000 mg | SUBCUTANEOUS | Status: DC
  Administered 2024-09-07: 120 mg via SUBCUTANEOUS
  Filled 2024-09-07: qty 120

## 2024-09-07 NOTE — Progress Notes (Signed)
 Hematology and Oncology Follow Up Visit  Brian Mays [id number] 07-15-1946 78 y.o. 09/07/2024   Principle Diagnosis:  Metastatic low grade neuroendocrine tumor-hepatic metastases Iron  deficiency anemia Erythropoietin  deficient anemia   Past Diagnosis:  S/p yttrium-90 intrahepatic therapy - November 2016 Lutathera  (Lu 177) injection on 02/01/2018 - s/p cycle 4 SBRT to T6 lesion -- 11/06/2021   Current Therapy:        Somatuline 120 mg q 4 week-start on 08/01/2023  IV Iron  as indicated  --Venofer  given on 06/27/2023 Lutathera -retreatment -09/06/2023 -- s/p 2/2 cycles Aranesp 300 mcg SQ q. 3 weeks for hemoglobin less than 10   Interim History:  Mr. Chatterjee is here today for follow-up and treatment. He is doing well but does note fatigue on the last 9 holes of golf. He is still getting out and doing the things he enjoys. He is hoping to start biking again soon.  Chromogranin A last month was 180 (previously 170).  No fever, chills, n/v, cough, rash, dizziness, SOB, chest pain, palpitations, abdominal pain or changes in bowel or bladder habits at this time.  Neuropathy in the legs unchanged from baseline.  No falls or syncope.  Appetite and hydration are good. Weight is stable at 187 lbs.   ECOG Performance Status: 1 - Symptomatic but completely ambulatory  Medications:  Allergies as of 09/07/2024       Reactions   Iohexol  Hives    Code: HIVES, Desc: PER [name] @ [facility], PT IS ALLERGIC TO CONTRAST DYE 10/02/08/RM  05/01/10...needs full premeds per our protocol w/ [facility]., Onset Date: [id number]   Iodinated Contrast Media Hives        Medication List        Accurate as of September 07, 2024  1:14 PM. If you have any questions, ask your nurse or doctor.          amLODipine  2.5 MG tablet Commonly known as: NORVASC  Take 1 tablet (2.5 mg total) by mouth daily.   aspirin  EC 81 MG tablet Take 81 mg by mouth every morning.   atorvastatin  80 MG tablet Commonly known as:  LIPITOR TAKE 1 TABLET BY MOUTH EVERY DAY   Cholecalciferol  25 MCG (1000 UT) tablet Take 1,000 Units by mouth 2 (two) times daily.   clopidogrel  75 MG tablet Commonly known as: PLAVIX  TAKE 1 TABLET BY MOUTH EVERY DAY   cyanocobalamin  1000 MCG tablet Commonly known as: VITAMIN B12 Take 1,000 mcg by mouth every other day.   diphenhydramine -acetaminophen  25-500 MG Tabs tablet Commonly known as: TYLENOL  PM Take 1 tablet by mouth at bedtime as needed (sleep/pain).   fluticasone  50 MCG/ACT nasal spray Commonly known as: FLONASE  Place 2 sprays into both nostrils daily as needed (sinuses).   hydrocortisone  25 MG suppository Commonly known as: ANUSOL -HC INSERT 1 SUPPOSITORY RECTALLY TWICE A DAY AS NEEDED FOR HEMORRHOIDS   ipratropium 0.03 % nasal spray Commonly known as: ATROVENT  Place 2 sprays into the nose 3 (three) times daily as needed for rhinitis.   lansoprazole  30 MG capsule Commonly known as: PREVACID  TAKE 1 CAPSULE (30 MG TOTAL) BY MOUTH DAILY AT 12 NOON.   levothyroxine  50 MCG tablet Commonly known as: SYNTHROID  TAKE 1 TABLET BY MOUTH EVERY DAY BEFORE BREAKFAST   LORazepam  0.5 MG tablet Commonly known as: Ativan  1 tab po 30 minutes prior to radiation or MRI scans   losartan  100 MG tablet Commonly known as: COZAAR  Take 1 tablet (100 mg total) by mouth daily.   methocarbamol  500 MG  tablet Commonly known as: ROBAXIN  Take 1 tablet (500 mg total) by mouth every 8 (eight) hours as needed for muscle spasms.   metoprolol  tartrate 25 MG tablet Commonly known as: LOPRESSOR  TAKE 1/2 TABLET TWICE A DAY BY MOUTH What changed: See the new instructions.   nitroGLYCERIN  0.4 MG SL tablet Commonly known as: NITROSTAT  PLACE 1 TABLET UNDER THE TONGUE EVERY 5 (FIVE) MINUTES X 3 DOSES AS NEEDED FOR CHEST PAIN.   ranolazine  500 MG 12 hr tablet Commonly known as: RANEXA  TAKE 1 TABLET BY MOUTH TWICE DAILY . APPOINTMENT REQUIRED FOR FUTURE REFILLS   SOMATULINE DEPOT  [place] Inject  120 mcg into the skin every 28 (twenty-eight) days. Receives at Dr Jessy office   SOOTHE XP OP Place 1 drop into both eyes daily as needed (dry eyes).        Allergies:  Allergies  Allergen Reactions   Iohexol  Hives     Code: HIVES, Desc: PER [name] @ [facility], PT IS ALLERGIC TO CONTRAST DYE 10/02/08/RM  05/01/10...needs full premeds per our protocol w/ [facility]., Onset Date: [id number]    Iodinated Contrast Media Hives    Past Medical History, Surgical history, Social history, and Family History were reviewed and updated.  Review of Systems: All other 10 point review of systems is negative.   Physical Exam:  vitals were not taken for this visit.   Wt Readings from Last 3 Encounters:  08/15/24 184 lb 6.4 oz (83.6 kg)  08/07/24 184 lb (83.5 kg)  07/13/24 182 lb (82.6 kg)    Ocular: Sclerae unicteric, pupils equal, round and reactive to light Ear-nose-throat: Oropharynx clear, dentition fair Lymphatic: No cervical or supraclavicular adenopathy Lungs no rales or rhonchi, good excursion bilaterally Heart regular rate and rhythm, no murmur appreciated Abd soft, nontender, positive bowel sounds MSK no focal spinal tenderness, no joint edema Neuro: non-focal, well-oriented, appropriate affect Breasts: Deferred   Lab Results  Component Value Date   WBC 5.4 08/07/2024   HGB 11.1 (L) 08/07/2024   HCT 34.6 (L) 08/07/2024   MCV 96.9 08/07/2024   PLT 126 (L) 08/07/2024   Lab Results  Component Value Date   FERRITIN 182 08/07/2024   IRON  61 08/07/2024   TIBC 295 08/07/2024   UIBC 234 08/07/2024   IRONPCTSAT 21 08/07/2024   Lab Results  Component Value Date   RETICCTPCT 1.3 08/07/2024   RBC 3.57 (L) 08/07/2024   RBC 3.51 (L) 08/07/2024   No results found for: KPAFRELGTCHN, LAMBDASER, KAPLAMBRATIO No results found for: KIMBERLY LE, IGMSERUM No results found for: STEPHANY CARLOTA BENSON MARKEL EARLA JOANNIE DOC VICK,  SPEI   Chemistry      Component Value Date/Time   NA 141 08/07/2024 1037   NA 141 06/22/2022 0942   NA 144 10/04/2017 0904   NA 141 07/23/2016 1255   K 4.9 08/07/2024 1037   K 3.8 10/04/2017 0904   K 4.1 07/23/2016 1255   CL 105 08/07/2024 1037   CL 103 10/04/2017 0904   CO2 26 08/07/2024 1037   CO2 28 10/04/2017 0904   CO2 27 07/23/2016 1255   BUN 21 08/07/2024 1037   BUN 20 06/22/2022 0942   BUN 14 10/04/2017 0904   BUN 13.8 07/23/2016 1255   CREATININE 1.08 08/07/2024 1037   CREATININE 0.8 10/04/2017 0904   CREATININE 1.0 07/23/2016 1255      Component Value Date/Time   CALCIUM  9.6 08/07/2024 1037   CALCIUM  9.4 10/04/2017 0904   CALCIUM  9.2 07/23/2016 1255  ALKPHOS 57 08/07/2024 1037   ALKPHOS 64 10/04/2017 0904   ALKPHOS 77 07/23/2016 1255   AST 22 08/07/2024 1037   AST 23 07/23/2016 1255   ALT 12 08/07/2024 1037   ALT 22 10/04/2017 0904   ALT 13 07/23/2016 1255   BILITOT 0.6 08/07/2024 1037   BILITOT 1.13 07/23/2016 1255       Impression and Plan:  Mr. Leino is a very pleasant 78 yo caucasian gentleman with metastatic low-grade neuroendocrine carcinoma with hepatic metastasis and bone metastasis.   Chromogranin A pending.  Recent PET scan showed stable disease.  He will continue on his Somatuline.   No ESA needed this visit.  Follow-up in 1 month.   Lauraine Pepper, NP 11/7/20251:14 PM

## 2024-09-07 NOTE — Patient Instructions (Signed)
 Lanreotide Injection What is this medication? LANREOTIDE (lan REE oh tide) treats high levels of growth hormone (acromegaly). It is used when other therapies have not worked well enough or cannot be tolerated. It works by reducing the amount of growth hormone your body makes. This reduces symptoms and the risk of health problems caused by too much growth hormone, such as diabetes and heart disease. It may also be used to treat neuroendocrine tumors, a cancer of the cells that release hormones and other substances in your body. It works by slowing down the release of these substances from the cells. This slows tumor growth. It also decreases the symptoms of carcinoid syndrome, such as flushing or diarrhea. This medicine may be used for other purposes; ask your health care provider or pharmacist if you have questions. COMMON BRAND NAME(S): Somatuline [place] What should I tell my care team before I take this medication? They need to know if you have any of these conditions: Diabetes Gallbladder disease Heart disease Kidney disease Liver disease Pancreatic disease Thyroid disease An unusual or allergic reaction to lanreotide, other medications, foods, dyes, or preservatives Pregnant or trying to get pregnant Breastfeeding How should I use this medication? This medication is injected under the skin. It is given by your care team in a hospital or clinic setting. Talk to your care team about the use of this medication in children. Special care may be needed. Overdosage: If you think you have taken too much of this medicine contact a poison control center or emergency room at once. NOTE: This medicine is only for you. Do not share this medicine with others. What if I miss a dose? Keep appointments for follow-up doses. It is important not to miss your dose. Call your care team if you are unable to keep an appointment. What may interact with this medication? Bromocriptine Cyclosporine Certain  medications for blood pressure, heart disease, irregular heartbeat Certain medications for diabetes Quinidine Terfenadine This list may not describe all possible interactions. Give your health care provider a list of all the medicines, herbs, non-prescription drugs, or dietary supplements you use. Also tell them if you smoke, drink alcohol, or use illegal drugs. Some items may interact with your medicine. What should I watch for while using this medication? Visit your care team for regular checks on your progress. Tell your care team if your symptoms do not start to get better or if they get worse. Your condition will be monitored carefully while you are receiving this medication. You may need blood work while you are taking this medication. This medication may increase blood sugar. The risk may be higher in patients who already have diabetes. Ask your care team what you can do to lower your risk of diabetes while taking this medication. Talk to your care team if you wish to become pregnant or think you may be pregnant. This medication can cause serious birth defects. Do not breast-feed while taking this medication and for 6 months after stopping therapy. This medication may cause infertility. Talk to your care team if you are concerned about your fertility. What side effects may I notice from receiving this medication? Side effects that you should report to your care team as soon as possible: Allergic reactions--skin rash, itching, hives, swelling of the face, lips, tongue, or throat Gallbladder problems--severe stomach pain, nausea, vomiting, fever High blood sugar (hyperglycemia)--increased thirst or amount of urine, unusual weakness or fatigue, blurry vision Increase in blood pressure Low blood sugar (hypoglycemia)--pale, blue or purple  skin or lips, sweating, fussiness, rapid heartbeat, poor feeding, low body temperature Low thyroid levels (hypothyroidism)--unusual weakness or fatigue,  increased sensitivity to cold, constipation, hair loss, dry skin, weight gain, feelings of depression Oily or light-colored stools, diarrhea, bloating, weight loss Slow heartbeat--dizziness, feeling faint or lightheaded, confusion, trouble breathing, unusual weakness or fatigue Side effects that usually do not require medical attention (report these to your care team if they continue or are bothersome): Diarrhea Dizziness Headache Muscle spasms Nausea Pain, redness, or irritation at injection site Stomach pain This list may not describe all possible side effects. Call your doctor for medical advice about side effects. You may report side effects to FDA at 1-800-FDA-1088. Where should I keep my medication? This medication is given in a hospital or clinic. It will not be stored at home. NOTE: This sheet is a summary. It may not cover all possible information. If you have questions about this medicine, talk to your doctor, pharmacist, or health care provider.  2024 Elsevier/Gold Standard (2023-09-30 00:00:00)

## 2024-09-10 LAB — CHROMOGRANIN A: Chromogranin A (ng/mL): 170.5 ng/mL — ABNORMAL HIGH (ref 0.0–101.8)

## 2024-09-13 ENCOUNTER — Ambulatory Visit: Admitting: Physical Therapy

## 2024-09-13 ENCOUNTER — Encounter: Payer: Self-pay | Admitting: Physical Therapy

## 2024-09-13 DIAGNOSIS — M5459 Other low back pain: Secondary | ICD-10-CM

## 2024-09-13 DIAGNOSIS — R2689 Other abnormalities of gait and mobility: Secondary | ICD-10-CM

## 2024-09-13 NOTE — Therapy (Signed)
 OUTPATIENT PHYSICAL THERAPY LOWER EXTREMITY TREATMENT   Patient Name: Brian Mays MRN: [id number] DOB:16-Mar-1946, 78 y.o., male Today's Date: 09/13/2024   END OF SESSION:  PT End of Session - 09/13/24 0933     Visit Number 4    Number of Visits 16    Date for Recertification  10/08/24    Authorization Type aetna medicare    PT Start Time [phone]    PT Stop Time 1015    PT Time Calculation (min) 41 min    Equipment Utilized During Treatment Gait belt    Activity Tolerance Patient tolerated treatment well    Behavior During Therapy [facility] for tasks assessed/performed           Past Medical History:  Diagnosis Date   Anemia    in past   Cataract    Colon polyps    Diverticulosis    Gallstones    GERD (gastroesophageal reflux disease)    Heart attack (HCC)    mild, Spring 2017   Heart murmur    Hemorrhoid    Hiatal hernia    HTN (hypertension)    Hyperlipidemia    Hypothyroidism    Iron  deficiency anemia due to chronic blood loss 03/29/2019   Iron  malabsorption 03/29/2019   Metastatic carcinoma (HCC) 2010   Dr Frank   Neuroendocrine cancer [facility]) 08/2021   Pancreatitis 1998   chronic   Past Surgical History:  Procedure Laterality Date   APPENDECTOMY  1962   CARDIAC CATHETERIZATION N/A 02/10/2016   Procedure: Left Heart Cath and Coronary Angiography;  Surgeon: Gordy Bergamo, MD;  Location: [facility] INVASIVE CV LAB;  Service: Cardiovascular;  Laterality: N/A;   CARDIAC CATHETERIZATION  02/10/2016   Procedure: Coronary/Graft Atherectomy;  Surgeon: Gordy Bergamo, MD;  Location: [facility] INVASIVE CV LAB;  Service: Cardiovascular;;   CARDIAC CATHETERIZATION  02/10/2016   Procedure: Coronary Stent Intervention;  Surgeon: Gordy Bergamo, MD;  Location: [facility] INVASIVE CV LAB;  Service: Cardiovascular;;   CATARACT EXTRACTION W/ INTRAOCULAR LENS  IMPLANT, BILATERAL Bilateral    CORONARY STENT INTERVENTION N/A 12/16/2021   Procedure: CORONARY STENT INTERVENTION;  Surgeon: Wendel Lurena POUR, MD;   Location: [facility] INVASIVE CV LAB;  Service: Cardiovascular;  Laterality: N/A;   IR GENERIC HISTORICAL  12/30/2015   IR RADIOLOGIST EVAL & MGMT 12/30/2015 Marcey Moan, MD [facility] INTERV RAD   IR GENERIC HISTORICAL  11/09/2016   IR RADIOLOGIST EVAL & MGMT 11/09/2016 Marcey Moan, MD [facility] INTERV RAD   IR RADIOLOGIST EVAL & MGMT  03/01/2017   IR RADIOLOGIST EVAL & MGMT  06/29/2017   LAPAROSCOPIC CHOLECYSTECTOMY  1999   LEFT HEART CATH AND CORONARY ANGIOGRAPHY N/A 12/16/2021   Procedure: LEFT HEART CATH AND CORONARY ANGIOGRAPHY;  Surgeon: Wendel Lurena POUR, MD;  Location: [facility] INVASIVE CV LAB;  Service: Cardiovascular;  Laterality: N/A;   LIVER BIOPSY  2010   RADIOACTIVE SEED IMPLANT  X 3   to my liver   TUMOR EXCISION  01/2009   Carcinoil Resection    TUMOR REMOVAL     from small intestine   Patient Active Problem List   Diagnosis Date Noted   Metastatic cancer to spine (HCC) 02/08/2022   Prediabetes 09/03/2021   Near syncope 04/22/2020   Iron  deficiency anemia due to chronic blood loss 03/29/2019   Iron  malabsorption 03/29/2019   Lumbar radiculopathy 08/16/2017   CAD (coronary artery disease) 09/21/2016   Unstable angina (HCC) 02/09/2016   Vitamin D  deficiency 03/11/2015   Metastatic malignant neuroendocrine tumor to  liver (HCC) 02/14/2015   Hyperglycemia 04/03/2012   Diarrhea 03/29/2012   B12 deficiency 04/19/2011   Antiplatelet or antithrombotic long-term use 03/30/2011   TOBACCO USE, QUIT 10/13/2009   Unspecified vitamin D  deficiency 06/19/2009   Neoplasm by body site 12/23/2008   DIVERTICULOSIS, COLON 11/08/2008   ABDOMINAL PAIN 10/02/2008   Hypothyroidism 12/13/2007   HEMORRHOIDS, NOS 12/13/2007   Belching 12/13/2007   History of colonic polyps 12/13/2007   Essential hypertension 09/15/2007   Gastroesophageal reflux disease 09/15/2007   PANCREATITIS, CHRONIC 09/15/2007   CARDIAC MURMUR 09/15/2007   SNORING 09/15/2007     PCP: Harlene Copland  REFERRING PROVIDER: Harlene  Copland  REFERRING DIAG: Balance   THERAPY DIAG:  Other abnormalities of gait and mobility  Other low back pain  Rationale for Evaluation and Treatment: Rehabilitation  ONSET DATE:    SUBJECTIVE:   SUBJECTIVE STATEMENT: Pt with no new complaints. Thinks back is doing well.    Eval:  PT states Previous back pain- from lifting bags of concrete.  Also got a new mattress which he thinks helped. Did have MRI scheduled, but cancelled it, because it got a bit better on its own. He had a lot of pain in the morning, then got better as the day went on. Still hurts at times, with a lot of bending.  He also feels like his balance has improved since he mentioned it to the doctor.  Balance: no AD. Stairs: yes, bedroom on 1st floor, But has to go up 5 steps to enter house. 2 hand rails. Lives alone.  Exercise: walks outside, [place] or trail in woods, up to 3 mi, about 1-2/wk.  and uses mountain bike  Does have walking stick. Likes to play golf 1-2 x/wk, states no problem with balance with golf, rides in cart.  Cancer: gets injection every 4 weeks  Denies dizziness.   Stairs: 4-5 to get in back (1 rail) ,  1 to get in front  PERTINENT HISTORY: Anemic, low hemoglobin, multiple stents,   PAIN:  Are you having pain? Yes: NPRS scale: 0-1 /10 Pain location: Low back  Pain description: sore Aggravating factors: bending, increased activity  Relieving factors: rest  PRECAUTIONS: Fall  WEIGHT BEARING RESTRICTIONS: No  FALLS:  Has patient fallen in last 6 months? Yes. Number of falls 1 1 fall out in garden in loose soil.  No other falls, but a few near falls. Mostly inside   PLOF: Independent  PATIENT GOALS:  Decreased pain in back, improve balance   NEXT MD VISIT:   OBJECTIVE:   DIAGNOSTIC FINDINGS:   PATIENT SURVEYS:    COGNITION: Overall cognitive status: Within functional limits for tasks assessed     SENSATION: WFL  EDEMA:    POSTURE:    No Significant postural  limitations  PALPATION:   LOWER EXTREMITY ROM: WFL  Active /Passive ROM Left eval Right eval  Hip flexion    Hip extension    Hip abduction    Hip adduction    Hip internal rotation    Hip external rotation    Knee flexion    Knee extension    Ankle dorsiflexion    Ankle plantarflexion    Ankle inversion    Ankle eversion     (Blank rows = not tested)  LOWER EXTREMITY MMT:  MMT Left eval Right  eval  Hip flexion 4 4  Hip extension    Hip abduction    Hip adduction    Hip internal rotation  Hip external rotation    Knee flexion 4 4  Knee extension 4 4  Ankle dorsiflexion    Ankle plantarflexion    Ankle inversion    Ankle eversion     (Blank rows = not tested)  LOWER EXTREMITY SPECIAL TESTS:     FUNCTIONAL TESTS:   5 time sit to stand: no hands:  14.65 sec  TUG:  10.97 sec DGI:      17      GAIT:   TODAY'S TREATMENT:                                                                                                                              DATE:   09/13/2024 Therapeutic Exercise:  education for HEP Aerobic: Supine: Seated:  sit to stand x 10 regular chair height, no hands  Standing:   HR x 15,  Stretches:   Seated HSS 20 sec x 4 bil;   Neuromuscular Re-education: Bwd walking in hallway x 4   Large step with fwd, bwd and lateral stepping x 10 ea bil;  Tandem stance at counter 30 sec x 1 bil; With head turns 2 x 10 bil;  SLS ( at counter) 5-10 sec x 3 bil; Toe taps x 20 6 in step  Slow  Marching x 20  all at counter Manual Therapy: Therapeutic Activity: Self Care:    Therapeutic Exercise: education for HEP Aerobic: Supine: Seated:  sit to stand x 10 mat table  Standing: hip abd 2 x 10 bil;  HR x 15,  Marching x 20  all at counter- education for HEP.  Stretches:   Seated HSS 20 sec x 4 bil;   Neuromuscular Re-education: Bwd walking in hallway x 4   Large step with fwd, bwd and lateral stepping x 10 ea bil;  Tandem stance at  counter 30 sec x 1 bil; With head turns 2 x 10 bil;  SLS ( at counter) 5-10 sec x 3 bil; Manual Therapy: Therapeutic Activity: Self Care:     PATIENT EDUCATION:  Education details: Initial HEP reviewed,  Person educated: Patient Education method: Explanation, Demonstration, Tactile cues, Verbal cues, and Handouts Education comprehension: verbalized understanding, returned demonstration, verbal cues required, tactile cues required, and needs further education   HOME EXERCISE PROGRAM: Access Code: [email or web address] URL: [email or web address] Date: 08/30/2024 Prepared by: Tinnie Don  Exercises - Supine Lower Trunk Rotation  - 1 x daily - 10 reps - 5 hold - Seated Hamstring Stretch  - 1-2 x daily - 3 reps - 20 hold - Hooklying Single Knee to Chest Stretch  - 2 x daily - 3 reps - 20 hold - Sit to Stand  - 1 x daily - 1 sets - 5- 10 reps  ASSESSMENT:  CLINICAL IMPRESSION: Pt with good tolerance/endurance for activity today, and is progressing well.  Will continue to benefit from progressive, dynamic  balance.   Eval: Patient presents with primary complaint of  reduced mobility, with decreased balance and back pain. Both back pain and balance have improved some since initial MD appt, but he does have deficits with both. He will benefit from education on HEP for back mobility and strength. He has deficits with testing for dynamic stability and gait, and will benefit from improving safety with dynamic activity to improve confidence and reduce fall risk.  Pt with decreased ability for full functional activities. Pt will  benefit from skilled PT to improve deficits and pain and to return to PLOF.   OBJECTIVE IMPAIRMENTS: Abnormal gait, decreased activity tolerance, decreased balance, decreased mobility, decreased strength, increased muscle spasms, improper body mechanics, and pain.   ACTIVITY LIMITATIONS: carrying, lifting, bending, standing, squatting, stairs, and locomotion  level  PARTICIPATION LIMITATIONS: cleaning, shopping, community activity, and yard work  PERSONAL FACTORS: Past/current experiences and Time since onset of injury/illness/exacerbation are also affecting patient's functional outcome.   REHAB POTENTIAL: Good  CLINICAL DECISION MAKING: Stable/uncomplicated  EVALUATION COMPLEXITY: Low   GOALS: Goals reviewed with patient? Yes  SHORT TERM GOALS: Target date: 08/27/2024   Pt to be independent with initial HEP  Goal status: INITIAL   LONG TERM GOALS: Target date: 10/08/2024   Pt to be independent with final HEP  Goal status: INITIAL  2.  Pt to demo ability and optimal mechanics for bend, lift, squat, to improve ability and pain with IADLs.  Goal status: INITIAL  3.  Pt to demo dynamic balance to be [facility] for pt age, to reduce fall risk.   Goal status: INITIAL  4.  Pt to demo improved score on DGI by at least 3 points.    Goal status: INITIAL    PLAN:  PT FREQUENCY: 1-2x/week  PT DURATION: 8 weeks  PLANNED INTERVENTIONS: Therapeutic exercises, Therapeutic activity, Neuromuscular re-education, Patient/Family education, Self Care, Joint mobilization, Joint manipulation, Stair training, Orthotic/Fit training, DME instructions, Aquatic Therapy, Dry Needling, Electrical stimulation, Cryotherapy, Moist heat, Taping, Ultrasound, Ionotophoresis 4mg /ml Dexamethasone , Manual therapy,  Vasopneumatic device, Traction, Spinal manipulation, Spinal mobilization,Balance training, Gait training,   PLAN FOR NEXT SESSION: dynamic balance, LE strength for HEP , backc mobility  Larger bos stepping, toe taps, stairs, tandem walk, head turns and eyes closed    Tinnie Don, PT, DPT 3:52 PM  09/13/24

## 2024-10-03 ENCOUNTER — Inpatient Hospital Stay: Attending: Hematology & Oncology

## 2024-10-03 ENCOUNTER — Encounter: Payer: Self-pay | Admitting: Family

## 2024-10-03 ENCOUNTER — Inpatient Hospital Stay: Admitting: Family

## 2024-10-03 ENCOUNTER — Inpatient Hospital Stay

## 2024-10-03 VITALS — BP 146/57 | HR 54 | Temp 97.9°F | Resp 18 | Wt 186.0 lb

## 2024-10-03 DIAGNOSIS — D509 Iron deficiency anemia, unspecified: Secondary | ICD-10-CM | POA: Diagnosis not present

## 2024-10-03 DIAGNOSIS — C7B8 Other secondary neuroendocrine tumors: Secondary | ICD-10-CM | POA: Diagnosis not present

## 2024-10-03 DIAGNOSIS — D500 Iron deficiency anemia secondary to blood loss (chronic): Secondary | ICD-10-CM

## 2024-10-03 DIAGNOSIS — C7A8 Other malignant neuroendocrine tumors: Secondary | ICD-10-CM | POA: Diagnosis present

## 2024-10-03 LAB — CBC WITH DIFFERENTIAL (CANCER CENTER ONLY)
Abs Immature Granulocytes: 0.03 K/uL (ref 0.00–0.07)
Basophils Absolute: 0 K/uL (ref 0.0–0.1)
Basophils Relative: 1 %
Eosinophils Absolute: 0.4 K/uL (ref 0.0–0.5)
Eosinophils Relative: 9 %
HCT: 34.7 % — ABNORMAL LOW (ref 39.0–52.0)
Hemoglobin: 11.3 g/dL — ABNORMAL LOW (ref 13.0–17.0)
Immature Granulocytes: 1 %
Lymphocytes Relative: 11 %
Lymphs Abs: 0.6 K/uL — ABNORMAL LOW (ref 0.7–4.0)
MCH: 31.7 pg (ref 26.0–34.0)
MCHC: 32.6 g/dL (ref 30.0–36.0)
MCV: 97.2 fL (ref 80.0–100.0)
Monocytes Absolute: 0.4 K/uL (ref 0.1–1.0)
Monocytes Relative: 8 %
Neutro Abs: 3.5 K/uL (ref 1.7–7.7)
Neutrophils Relative %: 70 %
Platelet Count: 137 K/uL — ABNORMAL LOW (ref 150–400)
RBC: 3.57 MIL/uL — ABNORMAL LOW (ref 4.22–5.81)
RDW: 14.6 % (ref 11.5–15.5)
WBC Count: 5 K/uL (ref 4.0–10.5)
nRBC: 0 % (ref 0.0–0.2)

## 2024-10-03 LAB — CMP (CANCER CENTER ONLY)
ALT: 16 U/L (ref 0–44)
AST: 28 U/L (ref 15–41)
Albumin: 4 g/dL (ref 3.5–5.0)
Alkaline Phosphatase: 57 U/L (ref 38–126)
Anion gap: 10 (ref 5–15)
BUN: 18 mg/dL (ref 8–23)
CO2: 28 mmol/L (ref 22–32)
Calcium: 9.5 mg/dL (ref 8.9–10.3)
Chloride: 106 mmol/L (ref 98–111)
Creatinine: 1.18 mg/dL (ref 0.61–1.24)
GFR, Estimated: >60 mL/min (ref >60)
Glucose, Bld: 95 mg/dL (ref 70–99)
Potassium: 4.6 mmol/L (ref 3.5–5.1)
Sodium: 144 mmol/L (ref 135–145)
Total Bilirubin: 0.7 mg/dL (ref 0.0–1.2)
Total Protein: 7.1 g/dL (ref 6.5–8.1)

## 2024-10-03 LAB — RETICULOCYTES
Immature Retic Fract: 19.8 % — ABNORMAL HIGH (ref 2.3–15.9)
RBC.: 3.55 MIL/uL — ABNORMAL LOW (ref 4.22–5.81)
Retic Count, Absolute: 58.2 K/uL (ref 19.0–186.0)
Retic Ct Pct: 1.6 % (ref 0.4–3.1)

## 2024-10-03 LAB — IRON AND IRON BINDING CAPACITY (CC-WL,HP ONLY)
Iron: 67 ug/dL (ref 45–182)
Saturation Ratios: 21 % (ref 17.9–39.5)
TIBC: 321 ug/dL (ref 250–450)
UIBC: 254 ug/dL

## 2024-10-03 LAB — FERRITIN: Ferritin: 222 ng/mL (ref 24–336)

## 2024-10-03 MED ORDER — LANREOTIDE ACETATE 120 MG/0.5ML ~~LOC~~ SOLN
120.0000 mg | SUBCUTANEOUS | Status: DC
  Administered 2024-10-03: 120 mg via SUBCUTANEOUS
  Filled 2024-10-03: qty 0.5

## 2024-10-03 NOTE — Patient Instructions (Signed)
 Lanreotide Injection What is this medication? LANREOTIDE (lan REE oh tide) treats high levels of growth hormone (acromegaly). It is used when other therapies have not worked well enough or cannot be tolerated. It works by reducing the amount of growth hormone your body makes. This reduces symptoms and the risk of health problems caused by too much growth hormone, such as diabetes and heart disease. It may also be used to treat neuroendocrine tumors, a cancer of the cells that release hormones and other substances in your body. It works by slowing down the release of these substances from the cells. This slows tumor growth. It also decreases the symptoms of carcinoid syndrome, such as flushing or diarrhea. This medicine may be used for other purposes; ask your health care provider or pharmacist if you have questions. COMMON BRAND NAME(S): Somatuline [place] What should I tell my care team before I take this medication? They need to know if you have any of these conditions: Diabetes Gallbladder disease Heart disease Kidney disease Liver disease Pancreatic disease Thyroid disease An unusual or allergic reaction to lanreotide, other medications, foods, dyes, or preservatives Pregnant or trying to get pregnant Breastfeeding How should I use this medication? This medication is injected under the skin. It is given by your care team in a hospital or clinic setting. Talk to your care team about the use of this medication in children. Special care may be needed. Overdosage: If you think you have taken too much of this medicine contact a poison control center or emergency room at once. NOTE: This medicine is only for you. Do not share this medicine with others. What if I miss a dose? Keep appointments for follow-up doses. It is important not to miss your dose. Call your care team if you are unable to keep an appointment. What may interact with this medication? Bromocriptine Cyclosporine Certain  medications for blood pressure, heart disease, irregular heartbeat Certain medications for diabetes Quinidine Terfenadine This list may not describe all possible interactions. Give your health care provider a list of all the medicines, herbs, non-prescription drugs, or dietary supplements you use. Also tell them if you smoke, drink alcohol, or use illegal drugs. Some items may interact with your medicine. What should I watch for while using this medication? Visit your care team for regular checks on your progress. Tell your care team if your symptoms do not start to get better or if they get worse. Your condition will be monitored carefully while you are receiving this medication. You may need blood work while you are taking this medication. This medication may increase blood sugar. The risk may be higher in patients who already have diabetes. Ask your care team what you can do to lower your risk of diabetes while taking this medication. Talk to your care team if you wish to become pregnant or think you may be pregnant. This medication can cause serious birth defects. Do not breast-feed while taking this medication and for 6 months after stopping therapy. This medication may cause infertility. Talk to your care team if you are concerned about your fertility. What side effects may I notice from receiving this medication? Side effects that you should report to your care team as soon as possible: Allergic reactions--skin rash, itching, hives, swelling of the face, lips, tongue, or throat Gallbladder problems--severe stomach pain, nausea, vomiting, fever High blood sugar (hyperglycemia)--increased thirst or amount of urine, unusual weakness or fatigue, blurry vision Increase in blood pressure Low blood sugar (hypoglycemia)--pale, blue or purple  skin or lips, sweating, fussiness, rapid heartbeat, poor feeding, low body temperature Low thyroid levels (hypothyroidism)--unusual weakness or fatigue,  increased sensitivity to cold, constipation, hair loss, dry skin, weight gain, feelings of depression Oily or light-colored stools, diarrhea, bloating, weight loss Slow heartbeat--dizziness, feeling faint or lightheaded, confusion, trouble breathing, unusual weakness or fatigue Side effects that usually do not require medical attention (report these to your care team if they continue or are bothersome): Diarrhea Dizziness Headache Muscle spasms Nausea Pain, redness, or irritation at injection site Stomach pain This list may not describe all possible side effects. Call your doctor for medical advice about side effects. You may report side effects to FDA at 1-800-FDA-1088. Where should I keep my medication? This medication is given in a hospital or clinic. It will not be stored at home. NOTE: This sheet is a summary. It may not cover all possible information. If you have questions about this medicine, talk to your doctor, pharmacist, or health care provider.  2024 Elsevier/Gold Standard (2023-09-30 00:00:00)

## 2024-10-03 NOTE — Progress Notes (Addendum)
 " Hematology and Oncology Follow Up Visit  Brian Mays [id number] [birth year] 78 y.o. 10/03/2024   Principle Diagnosis:  Metastatic low grade neuroendocrine tumor-hepatic metastases Iron  deficiency anemia Erythropoietin  deficient anemia   Past Diagnosis:  S/p yttrium-90 intrahepatic therapy - November 2016 Lutathera  (Lu 177) injection on 02/01/2018 - s/p cycle 4 SBRT to T6 lesion -- 11/06/2021   Current Therapy:        Somatuline 120 mg q 4 week-start on 08/01/2023  IV Iron  as indicated   Lutathera -retreatment -09/06/2023 -- s/p 2/2 cycles Aranesp 300 mcg SQ q. 3 weeks for hemoglobin less than 10   Interim History:  [name] is here at [facility] today for virtual follow-up and treatment. He is doing well overall. He has been having some lower back and hip pain recently when he first gets up out of bed. He states that once he gets moving it eases off. He plans to follow-up with ortho.  He has still been able to golf and ride his bike.  He did have a mechanical fall after parking his bike in sand at the beach. He states that the sand shifted and he toppled over. Thankfully he was not seriously injured but notes that it irritated his back pain.  No syncope.  No issue with fever, chills, n/v, cough, rash, SOB, chest pain, palpitations, abdominal pain or changes in bowel or bladder habits.  No swelling in the extremities.  Neuropathy in the lower extremities unchanged from baseline.  Appetite and hydration are good. Weight is stable at 186 lbs.   ECOG Performance Status: 1 - Symptomatic but completely ambulatory  Medications:  Allergies as of 10/03/2024       Reactions   Iohexol  Hives    Code: HIVES, Desc: PER [name] @ [facility], PT IS ALLERGIC TO CONTRAST DYE 10/02/08/RM  05/01/10...needs full premeds per our protocol w/ [facility]., Onset Date: [id number]   Iodinated Contrast Media Hives        Medication List        Accurate as of October 03, 2024  8:43 AM. If you have any  questions, ask your nurse or doctor.          amLODipine  2.5 MG tablet Commonly known as: NORVASC  Take 1 tablet (2.5 mg total) by mouth daily.   aspirin  EC 81 MG tablet Take 81 mg by mouth every morning.   atorvastatin  80 MG tablet Commonly known as: LIPITOR TAKE 1 TABLET BY MOUTH EVERY DAY   Cholecalciferol  25 MCG (1000 UT) tablet Take 1,000 Units by mouth 2 (two) times daily.   clopidogrel  75 MG tablet Commonly known as: PLAVIX  TAKE 1 TABLET BY MOUTH EVERY DAY   cyanocobalamin  1000 MCG tablet Commonly known as: VITAMIN B12 Take 1,000 mcg by mouth every other day.   diphenhydramine -acetaminophen  25-500 MG Tabs tablet Commonly known as: TYLENOL  PM Take 1 tablet by mouth at bedtime as needed (sleep/pain).   fluticasone  50 MCG/ACT nasal spray Commonly known as: FLONASE  Place 2 sprays into both nostrils daily as needed (sinuses).   hydrocortisone  25 MG suppository Commonly known as: ANUSOL -HC INSERT 1 SUPPOSITORY RECTALLY TWICE A DAY AS NEEDED FOR HEMORRHOIDS   ipratropium 0.03 % nasal spray Commonly known as: ATROVENT  Place 2 sprays into the nose 3 (three) times daily as needed for rhinitis.   lansoprazole  30 MG capsule Commonly known as: PREVACID  TAKE 1 CAPSULE (30 MG TOTAL) BY MOUTH DAILY AT 12 NOON.   levothyroxine  50 MCG tablet Commonly known as: SYNTHROID  TAKE  1 TABLET BY MOUTH EVERY DAY BEFORE BREAKFAST   LORazepam  0.5 MG tablet Commonly known as: Ativan  1 tab po 30 minutes prior to radiation or MRI scans   losartan  100 MG tablet Commonly known as: COZAAR  Take 1 tablet (100 mg total) by mouth daily.   methocarbamol  500 MG tablet Commonly known as: ROBAXIN  Take 1 tablet (500 mg total) by mouth every 8 (eight) hours as needed for muscle spasms.   metoprolol  tartrate 25 MG tablet Commonly known as: LOPRESSOR  TAKE 1/2 TABLET TWICE A DAY BY MOUTH What changed: See the new instructions.   nitroGLYCERIN  0.4 MG SL tablet Commonly known as:  NITROSTAT  PLACE 1 TABLET UNDER THE TONGUE EVERY 5 (FIVE) MINUTES X 3 DOSES AS NEEDED FOR CHEST PAIN.   ranolazine  500 MG 12 hr tablet Commonly known as: RANEXA  TAKE 1 TABLET BY MOUTH TWICE DAILY . APPOINTMENT REQUIRED FOR FUTURE REFILLS   SOMATULINE DEPOT  [place] Inject 120 mcg into the skin every 28 (twenty-eight) days. Receives at Dr Jessy office   SOOTHE XP OP Place 1 drop into both eyes daily as needed (dry eyes).        Allergies:  Allergies  Allergen Reactions   Iohexol  Hives     Code: HIVES, Desc: PER [name] @ [facility], PT IS ALLERGIC TO CONTRAST DYE 10/02/08/RM  05/01/10...needs full premeds per our protocol w/ [facility]., Onset Date: [id number]    Iodinated Contrast Media Hives    Past Medical History, Surgical history, Social history, and Family History were reviewed and updated.  Review of Systems: All other 10 point review of systems is negative.   Physical Exam:  vitals were not taken for this visit.   Wt Readings from Last 3 Encounters:  09/07/24 187 lb (84.8 kg)  08/15/24 184 lb 6.4 oz (83.6 kg)  08/07/24 184 lb (83.5 kg)    Ocular: Sclerae unicteric, pupils equal, round and reactive to light Ear-nose-throat: Oropharynx Mays, dentition fair Lymphatic: No cervical or supraclavicular adenopathy Lungs no rales or rhonchi, good excursion bilaterally Heart regular rate and rhythm, no murmur appreciated Abd soft, nontender, positive bowel sounds MSK no focal spinal tenderness, no joint edema Neuro: non-focal, well-oriented, appropriate affect Breasts: Deferred   Lab Results  Component Value Date   WBC 5.1 09/07/2024   HGB 10.7 (L) 09/07/2024   HCT 32.8 (L) 09/07/2024   MCV 97.0 09/07/2024   PLT 123 (L) 09/07/2024   Lab Results  Component Value Date   FERRITIN 255 09/07/2024   IRON  67 09/07/2024   TIBC 283 09/07/2024   UIBC 216 09/07/2024   IRONPCTSAT 24 09/07/2024   Lab Results  Component Value Date   RETICCTPCT 1.5 09/07/2024   RBC 3.38  (L) 09/07/2024   RBC 3.35 (L) 09/07/2024   No results found for: KPAFRELGTCHN, LAMBDASER, KAPLAMBRATIO No results found for: IGGSERUM, IGA, IGMSERUM No results found for: STEPHANY CARLOTA BENSON MARKEL EARLA JOANNIE DOC VICK, SPEI   Chemistry      Component Value Date/Time   NA 141 09/07/2024 1302   NA 141 06/22/2022 0942   NA 144 10/04/2017 0904   NA 141 07/23/2016 1255   K 4.9 09/07/2024 1302   K 3.8 10/04/2017 0904   K 4.1 07/23/2016 1255   CL 107 09/07/2024 1302   CL 103 10/04/2017 0904   CO2 25 09/07/2024 1302   CO2 28 10/04/2017 0904   CO2 27 07/23/2016 1255   BUN 17 09/07/2024 1302   BUN 20 06/22/2022 0942   BUN  14 10/04/2017 0904   BUN 13.8 07/23/2016 1255   CREATININE 0.97 09/07/2024 1302   CREATININE 0.8 10/04/2017 0904   CREATININE 1.0 07/23/2016 1255      Component Value Date/Time   CALCIUM  9.4 09/07/2024 1302   CALCIUM  9.4 10/04/2017 0904   CALCIUM  9.2 07/23/2016 1255   ALKPHOS 58 09/07/2024 1302   ALKPHOS 64 10/04/2017 0904   ALKPHOS 77 07/23/2016 1255   AST 25 09/07/2024 1302   AST 23 07/23/2016 1255   ALT 14 09/07/2024 1302   ALT 22 10/04/2017 0904   ALT 13 07/23/2016 1255   BILITOT 0.6 09/07/2024 1302   BILITOT 1.13 07/23/2016 1255       Impression and Plan: Mr. Wheller is a very pleasant 78 yo caucasian gentleman with metastatic low-grade neuroendocrine carcinoma with hepatic metastasis and bone metastasis.   Chromogranin A pending. Last month level was 170 (previously 180).  He will continue on his Somatuline.   No ESA needed this visit.  Follow-up in 1 month.   Lauraine Pepper, NP 12/3/20258:43 AM  "

## 2024-10-04 LAB — CHROMOGRANIN A: Chromogranin A (ng/mL): 149.2 ng/mL — ABNORMAL HIGH (ref 0.0–101.8)

## 2024-10-05 ENCOUNTER — Inpatient Hospital Stay: Admitting: Family

## 2024-10-05 ENCOUNTER — Inpatient Hospital Stay

## 2024-10-08 ENCOUNTER — Encounter: Payer: Self-pay | Admitting: Family

## 2024-10-18 ENCOUNTER — Ambulatory Visit: Admitting: Sports Medicine

## 2024-10-18 ENCOUNTER — Encounter: Payer: Self-pay | Admitting: Sports Medicine

## 2024-10-18 VITALS — BP 146/65 | HR 56 | Temp 97.8°F | Wt 188.8 lb

## 2024-10-18 DIAGNOSIS — M545 Low back pain, unspecified: Secondary | ICD-10-CM

## 2024-10-18 DIAGNOSIS — C7B8 Other secondary neuroendocrine tumors: Secondary | ICD-10-CM

## 2024-10-18 DIAGNOSIS — G8929 Other chronic pain: Secondary | ICD-10-CM

## 2024-10-18 DIAGNOSIS — I10 Essential (primary) hypertension: Secondary | ICD-10-CM

## 2024-10-18 LAB — POC URINALSYSI DIPSTICK (AUTOMATED)
Bilirubin, UA: NEGATIVE
Blood, UA: NEGATIVE
Glucose, UA: NEGATIVE
Ketones, UA: NEGATIVE
Leukocytes, UA: NEGATIVE
Nitrite, UA: NEGATIVE
Protein, UA: NEGATIVE
Spec Grav, UA: 1.015 (ref 1.010–1.025)
Urobilinogen, UA: 0.2 U/dL
pH, UA: 6 (ref 5.0–8.0)

## 2024-10-18 MED ORDER — AMLODIPINE BESYLATE 5 MG PO TABS: 5.0000 mg | ORAL_TABLET | Freq: Every day | ORAL | 0 refills | Status: AC

## 2024-10-18 NOTE — Assessment & Plan Note (Addendum)
 Bp slightly high Will increase amlodipine  to 5 mg Monitor bp daily and keep a log Avoid salty foods Exercise regularly  Orders:   amLODipine  (NORVASC ) 5 MG tablet; Take 1 tablet (5 mg total) by mouth daily.

## 2024-10-18 NOTE — Progress Notes (Signed)
 Careteam: Patient Care Team: Copland, Harlene BROCKS, MD as PCP - General (Family Medicine) Nahser, Aleene PARAS, MD (Inactive) as PCP - Cardiology (Cardiology) Abran Norleen SAILOR, MD (Gastroenterology) Timmy Maude SAUNDERS, MD as Consulting Physician (Oncology) [employer] (Ophthalmology)  Allergies[1]  Chief Complaint  Patient presents with   Back Pain    Pt has been having left sided back pain for awhile now. He was scheduled for a MRI but canceled it due to him feeling better. He denies urinary symptoms.     Discussed the use of AI scribe software for clinical note transcription with the patient, who gave verbal consent to proceed.  History of Present Illness Brian Mays is a 78 year old male with carcinoid tumor presents with worsening back pain.  He experiences back pain primarily on the left side, which is excruciating in the morning and improves as the day progresses. The pain is described as shooting and does not radiate to his legs or groin. He uses a heating pad in the morning, which provides relief, allowing him to engage in activities such as playing golf after about an hour. No recent trauma, falls, or incidents of pulling or twisting his back. No urinary symptoms, weight loss, fever, or gastrointestinal issues.  His current pain management includes Tylenol  PM and Advil PM, as well as separate doses of Advil and Tylenol . He uses these medications to manage his pain, particularly in the morning.  He has carcinoid tumor and had a recent PET scans showing metastatic lesions to the liver and bone. These lesions have not changed in size according to recent imaging. He mentions being borderline anemic and undergoes blood work every four weeks.  He is currently on losartan  100 mg and amlodipine  2.5 mg for hypertension. He has not been monitoring his blood pressure at home recently but notes that it has been stable in the past.     Review of Systems:  Review of Systems   Constitutional:  Negative for chills, fever and weight loss.  HENT:  Negative for congestion and sore throat.   Eyes:  Negative for blurred vision.  Respiratory:  Negative for cough, sputum production and shortness of breath.   Cardiovascular:  Negative for chest pain, palpitations and leg swelling.  Gastrointestinal:  Negative for abdominal pain, heartburn and nausea.  Genitourinary:  Positive for flank pain. Negative for dysuria, frequency and hematuria.  Musculoskeletal:  Positive for back pain. Negative for falls and myalgias.  Neurological:  Negative for dizziness, sensory change and focal weakness.   Negative unless indicated in HPI.   Patient Active Problem List   Diagnosis Date Noted   Metastatic cancer to spine (HCC) 02/08/2022   Prediabetes 09/03/2021   Near syncope 04/22/2020   Iron  deficiency anemia due to chronic blood loss 03/29/2019   Iron  malabsorption 03/29/2019   Lumbar radiculopathy 08/16/2017   CAD (coronary artery disease) 09/21/2016   Unstable angina (HCC) 02/09/2016   Vitamin D  deficiency 03/11/2015   Metastatic malignant neuroendocrine tumor to liver (HCC) 02/14/2015   Hyperglycemia 04/03/2012   Diarrhea 03/29/2012   B12 deficiency 04/19/2011   Antiplatelet or antithrombotic long-term use 03/30/2011   TOBACCO USE, QUIT 10/13/2009   Unspecified vitamin D  deficiency 06/19/2009   Neoplasm by body site 12/23/2008   DIVERTICULOSIS, COLON 11/08/2008   ABDOMINAL PAIN 10/02/2008   Hypothyroidism 12/13/2007   HEMORRHOIDS, NOS 12/13/2007   Belching 12/13/2007   History of colonic polyps 12/13/2007   Essential hypertension 09/15/2007   Gastroesophageal reflux disease  09/15/2007   PANCREATITIS, CHRONIC 09/15/2007   CARDIAC MURMUR 09/15/2007   SNORING 09/15/2007   Past Medical History:  Diagnosis Date   Allergy 1999   CT dye   Anemia    in past   Blood transfusion without reported diagnosis    Cataract    Clotting disorder Current   Bleeding fro tumors  on liver   Colon polyps    Diverticulosis    Gallstones    GERD (gastroesophageal reflux disease)    Heart attack (HCC)    mild, Spring 2017   Heart murmur    Hemorrhoid    Hiatal hernia    HTN (hypertension)    Hyperlipidemia    Hypothyroidism    Iron  deficiency anemia due to chronic blood loss 03/29/2019   Iron  malabsorption 03/29/2019   Metastatic carcinoma (HCC) 2010   Dr Frank   Neuroendocrine cancer [facility]) 08/2021   Pancreatitis 1998   chronic   Past Surgical History:  Procedure Laterality Date   APPENDECTOMY  11/01/1960   CARDIAC CATHETERIZATION N/A 02/10/2016   Procedure: Left Heart Cath and Coronary Angiography;  Surgeon: Gordy Bergamo, MD;  Location: [facility] INVASIVE CV LAB;  Service: Cardiovascular;  Laterality: N/A;   CARDIAC CATHETERIZATION  02/10/2016   Procedure: Coronary/Graft Atherectomy;  Surgeon: Gordy Bergamo, MD;  Location: [facility] INVASIVE CV LAB;  Service: Cardiovascular;;   CARDIAC CATHETERIZATION  02/10/2016   Procedure: Coronary Stent Intervention;  Surgeon: Gordy Bergamo, MD;  Location: [facility] INVASIVE CV LAB;  Service: Cardiovascular;;   CATARACT EXTRACTION W/ INTRAOCULAR LENS  IMPLANT, BILATERAL Bilateral    CORONARY STENT INTERVENTION N/A 12/16/2021   Procedure: CORONARY STENT INTERVENTION;  Surgeon: Wendel Lurena POUR, MD;  Location: [facility] INVASIVE CV LAB;  Service: Cardiovascular;  Laterality: N/A;   IR GENERIC HISTORICAL  12/30/2015   IR RADIOLOGIST EVAL & MGMT 12/30/2015 Marcey Moan, MD [facility] INTERV RAD   IR GENERIC HISTORICAL  11/09/2016   IR RADIOLOGIST EVAL & MGMT 11/09/2016 Marcey Moan, MD [facility] INTERV RAD   IR RADIOLOGIST EVAL & MGMT  03/01/2017   IR RADIOLOGIST EVAL & MGMT  06/29/2017   LAPAROSCOPIC CHOLECYSTECTOMY  11/01/1997   LEFT HEART CATH AND CORONARY ANGIOGRAPHY N/A 12/16/2021   Procedure: LEFT HEART CATH AND CORONARY ANGIOGRAPHY;  Surgeon: Wendel Lurena POUR, MD;  Location: [facility] INVASIVE CV LAB;  Service: Cardiovascular;  Laterality: N/A;   LIVER BIOPSY   11/01/2008   RADIOACTIVE SEED IMPLANT  X 3   to my liver   SMALL INTESTINE SURGERY  Section removed 2009   TUMOR EXCISION  01/30/2009   Carcinoil Resection    TUMOR REMOVAL     from small intestine   Social History[2] Family History  Problem Relation Age of Onset   Kidney disease Mother    Hyperlipidemia Mother    Hypertension Mother    COPD Father    Ulcerative colitis Daughter    Colon cancer Neg Hx    Esophageal cancer Neg Hx    Rectal cancer Neg Hx    Stomach cancer Neg Hx    Allergies[3]  Medications: Patient's Medications  New Prescriptions   AMLODIPINE  (NORVASC ) 5 MG TABLET    Take 1 tablet (5 mg total) by mouth daily.  Previous Medications   ARTIFICIAL TEAR SOLUTION (SOOTHE XP OP)    Place 1 drop into both eyes daily as needed (dry eyes).   ASPIRIN  EC 81 MG TABLET    Take 81 mg by mouth every morning.   ATORVASTATIN  (LIPITOR) 80 MG TABLET  TAKE 1 TABLET BY MOUTH EVERY DAY   CHOLECALCIFEROL  1000 UNITS TABLET    Take 1,000 Units by mouth 2 (two) times daily.   CLOPIDOGREL  (PLAVIX ) 75 MG TABLET    TAKE 1 TABLET BY MOUTH EVERY DAY   DIPHENHYDRAMINE -ACETAMINOPHEN  (TYLENOL  PM) 25-500 MG TABS TABLET    Take 1 tablet by mouth at bedtime as needed (sleep/pain).   FLUTICASONE  (FLONASE ) 50 MCG/ACT NASAL SPRAY    Place 2 sprays into both nostrils daily as needed (sinuses).   HYDROCORTISONE  (ANUSOL -HC) 25 MG SUPPOSITORY    INSERT 1 SUPPOSITORY RECTALLY TWICE A DAY AS NEEDED FOR HEMORRHOIDS   IPRATROPIUM (ATROVENT ) 0.03 % NASAL SPRAY    Place 2 sprays into the nose 3 (three) times daily as needed for rhinitis.   LANREOTIDE ACETATE  (SOMATULINE DEPOT  [facility])    Inject 120 mcg into the skin every 28 (twenty-eight) days. Receives at Dr Jessy office   LANSOPRAZOLE  (PREVACID ) 30 MG CAPSULE    TAKE 1 CAPSULE (30 MG TOTAL) BY MOUTH DAILY AT 12 NOON.   LEVOTHYROXINE  (SYNTHROID ) 50 MCG TABLET    TAKE 1 TABLET BY MOUTH EVERY DAY BEFORE BREAKFAST   LORAZEPAM  (ATIVAN ) 0.5 MG TABLET    1 tab  po 30 minutes prior to radiation or MRI scans   LOSARTAN  (COZAAR ) 100 MG TABLET    Take 1 tablet (100 mg total) by mouth daily.   METHOCARBAMOL  (ROBAXIN ) 500 MG TABLET    Take 1 tablet (500 mg total) by mouth every 8 (eight) hours as needed for muscle spasms.   METOPROLOL  TARTRATE (LOPRESSOR ) 25 MG TABLET    TAKE 1/2 TABLET TWICE A DAY BY MOUTH   NITROGLYCERIN  (NITROSTAT ) 0.4 MG SL TABLET    PLACE 1 TABLET UNDER THE TONGUE EVERY 5 (FIVE) MINUTES X 3 DOSES AS NEEDED FOR CHEST PAIN.   RANOLAZINE  (RANEXA ) 500 MG 12 HR TABLET    TAKE 1 TABLET BY MOUTH TWICE DAILY . APPOINTMENT REQUIRED FOR FUTURE REFILLS   VITAMIN B-12 (CYANOCOBALAMIN ) 1000 MCG TABLET    Take 1,000 mcg by mouth every other day.  Modified Medications   No medications on file  Discontinued Medications   AMLODIPINE  (NORVASC ) 2.5 MG TABLET    Take 1 tablet (2.5 mg total) by mouth daily.    Physical Exam: Vitals:   10/18/24 1333 10/18/24 1338  BP: (!) 158/71 (!) 146/65  Pulse: (!) 56   Temp: 97.8 F (36.6 C)   TempSrc: Oral   SpO2: 99%   Weight: 188 lb 12.8 oz (85.6 kg)    Body mass index is 27.09 kg/m. BP Readings from Last 3 Encounters:  10/18/24 (!) 146/65  10/03/24 (!) 146/57  09/07/24 (!) 120/48   Wt Readings from Last 3 Encounters:  10/18/24 188 lb 12.8 oz (85.6 kg)  10/03/24 186 lb (84.4 kg)  09/07/24 187 lb (84.8 kg)    Physical Exam Constitutional:      Appearance: Normal appearance.  HENT:     Head: Normocephalic and atraumatic.  Cardiovascular:     Rate and Rhythm: Normal rate and regular rhythm.     Pulses: Normal pulses.     Heart sounds: Normal heart sounds.  Pulmonary:     Effort: No respiratory distress.     Breath sounds: No stridor. No wheezing or rales.  Abdominal:     General: Bowel sounds are normal. There is no distension.     Palpations: Abdomen is soft.     Tenderness: There is no abdominal tenderness. There is  no guarding.  Musculoskeletal:        General: No swelling.      Comments: No spinal or paraspinal tenderness SLR neg Strength intact  Neurological:     Mental Status: He is alert. Mental status is at baseline.     Sensory: No sensory deficit.     Motor: No weakness.     Labs reviewed: Basic Metabolic Panel: Recent Labs    08/07/24 1037 09/07/24 1302 10/03/24 0837  NA 141 141 144  K 4.9 4.9 4.6  CL 105 107 106  CO2 26 25 28   GLUCOSE 103* 99 95  BUN 21 17 18   CREATININE 1.08 0.97 1.18  CALCIUM  9.6 9.4 9.5   Liver Function Tests: Recent Labs    08/07/24 1037 09/07/24 1302 10/03/24 0837  AST 22 25 28   ALT 12 14 16   ALKPHOS 57 58 57  BILITOT 0.6 0.6 0.7  PROT 7.1 6.8 7.1  ALBUMIN  4.0 3.9 4.0   No results for input(s): LIPASE, AMYLASE in the last 8760 hours. No results for input(s): AMMONIA in the last 8760 hours. CBC: Recent Labs    08/07/24 1037 09/07/24 1302 10/03/24 0837  WBC 5.4 5.1 5.0  NEUTROABS 4.0 3.4 3.5  HGB 11.1* 10.7* 11.3*  HCT 34.6* 32.8* 34.7*  MCV 96.9 97.0 97.2  PLT 126* 123* 137*   Lipid Panel: Recent Labs    08/15/24 1558  CHOL 93  HDL 39.90  LDLCALC 30  TRIG 113.0  CHOLHDL 2   TSH: No results for input(s): TSH in the last 8760 hours. A1C: Lab Results  Component Value Date   HGBA1C 6.1 08/15/2024    Assessment & Plan Chronic bilateral low back pain without sciatica Pain in his lower back since 3-4 weeks No red flag signs SLR neg No spinal or paraspinal tenderness Instructed him to take tylenol  1 gm tid  Do not exceed more than 3 gm/ day  Do not take advil together with tyleno Use lidocaine  patch and heating pad Urine dipstick neg Orders:   POCT Urinalysis Dipstick (Automated)  Essential hypertension Bp slightly high Will increase amlodipine  to 5 mg Monitor bp daily and keep a log Avoid salty foods Exercise regularly  Orders:   amLODipine  (NORVASC ) 5 MG tablet; Take 1 tablet (5 mg total) by mouth daily.  Metastatic malignant neuroendocrine tumor to liver  [facility]) Reviewed PET scan  Pt denies fevers, chills, night sweats     No follow-ups on file.:   Brian Mays     [1]  Allergies Allergen Reactions   Iohexol  Hives     Code: HIVES, Desc: PER [name] @ [facility]IMARY [facility], PT IS ALLERGIC TO CONTRAST DYE 10/02/08/RM  05/01/10...needs full premeds per our protocol w/ [facility]., Onset Date: [id number]    Iodinated Contrast Media Hives  [2]  Social History Tobacco Use   Smoking status: Former    Current packs/day: 0.00    Average packs/day: 1.5 packs/day for 38.2 years (57.4 ttl pk-yrs)    Types: Cigarettes    Start date: 09/25/1959    Quit date: 12/21/1997    Years since quitting: 26.8   Smokeless tobacco: Never  Vaping Use   Vaping status: Never Used  Substance Use Topics   Alcohol use: Yes    Alcohol/week: 3.0 - 4.0 standard drinks of alcohol    Types: 3 - 4 Cans of beer per week    Comment: 1-2 beers twice a week   Drug use: No  [3]  Allergies Allergen  Reactions   Iohexol  Hives     Code: HIVES, Desc: PER [name] @ [facility], PT IS ALLERGIC TO CONTRAST DYE 10/02/08/RM  05/01/10...needs full premeds per our protocol w/ [facility]., Onset Date: [id number]    Iodinated Contrast Media Hives

## 2024-10-18 NOTE — Assessment & Plan Note (Addendum)
 Reviewed PET scan  Pt denies fevers, chills, night sweats

## 2024-10-19 ENCOUNTER — Encounter: Payer: Self-pay | Admitting: Family Medicine

## 2024-10-23 NOTE — Progress Notes (Signed)
 Designer, [employer] at [employer] [address] [place], [place] [address] [phone]  Date:  10/24/2024   Name:  Brian Mays   DOB:  September 22, 1946   MRN:  [id number]  PCP:  [name], MD    Chief Complaint: Follow-up (Recheck back pain /It doesn't seem to be much better )   History of Present Illness:  Brian Mays is a 78 y.o. very pleasant male patient who presents with the following:  Patient seen today with concern of back pain.  I saw him most recently in October for physical exam - history of metastatic malignant neuroendocrine tumor in the liver/carcinoid syndrome status post partial small bowel resection in 2010, as well as iron  malabsorption, CAD status post stenting in 2017, hypothyroidism, hypertension, chronic pancreatitis   Most recent PET scan from June shows unchanged skeletal mets -he has known history of thoracic mets  He recently got in touch with me regarding recurrent back pain When I saw him back in August he was concerned about back pain, he had recently for some concrete, thought he had strained his back.  He does have known history of bony metastasis I ordered a lumbar and thoracic MRI but he ended up canceling these because he was feeling better  He was seen in oncology clinic November 7-they noted he was stable Most recent thoracic MRI completed May 2024 and did show bony mets in the thoracic spine Most recent lumbar spine MRI January 2024 did not show any definite metastatic disease  He was seen at another clinic on December 18 for this lower back pain-treated conservatively They did make a minor adjustment to his blood pressure medicine, increased amlodipine  due to elevated blood pressure  Discussed the use of AI scribe software for clinical note transcription with the patient, who gave verbal consent to proceed.  History of Present Illness Brian Mays is a 78 year old male who presents with  left flank pain.  He has been experiencing left flank pain since August. Initially, the pain was severe but improved over time. Recently, he experienced a severe episode upon waking, which subsided after a few minutes, allowing him to engage in activities such as playing golf. The pain is sharp in the morning, particularly when he puts weight on his leg, but it diminishes as he moves around. He also notes slight pain in his hip when lying on his left side, but this is minimal.  No radiation of pain down his leg and no issues with bowel or bladder control. He reports no significant weight loss and does not experience pain at night, although he sometimes wakes up due to other concerns on his mind.  For pain management, he has been using over-the-counter medications such as Tylenol , Advil, and ibuprofen. He has previously used Tylenol  PM and Advil PM to aid sleep.    Patient Active Problem List   Diagnosis Date Noted   Metastatic cancer to spine (HCC) 02/08/2022   Prediabetes 09/03/2021   Near syncope 04/22/2020   Iron  deficiency anemia due to chronic blood loss 03/29/2019   Iron  malabsorption 03/29/2019   Lumbar radiculopathy 08/16/2017   CAD (coronary artery disease) 09/21/2016   Unstable angina (HCC) 02/09/2016   Vitamin D  deficiency 03/11/2015   Metastatic malignant neuroendocrine tumor to liver (HCC) 02/14/2015   Hyperglycemia 04/03/2012   Diarrhea 03/29/2012   B12 deficiency 04/19/2011   Antiplatelet or antithrombotic long-term use 03/30/2011  TOBACCO USE, QUIT 10/13/2009   Unspecified vitamin D  deficiency 06/19/2009   Neoplasm by body site 12/23/2008   DIVERTICULOSIS, COLON 11/08/2008   ABDOMINAL PAIN 10/02/2008   Hypothyroidism 12/13/2007   HEMORRHOIDS, NOS 12/13/2007   Belching 12/13/2007   History of colonic polyps 12/13/2007   Essential hypertension 09/15/2007   Gastroesophageal reflux disease 09/15/2007   PANCREATITIS, CHRONIC 09/15/2007   CARDIAC MURMUR 09/15/2007    SNORING 09/15/2007    Past Medical History:  Diagnosis Date   Allergy 1999   CT dye   Anemia    in past   Blood transfusion without reported diagnosis    Cataract    Clotting disorder Current   Bleeding fro tumors on liver   Colon polyps    Diverticulosis    Gallstones    GERD (gastroesophageal reflux disease)    Heart attack (HCC)    mild, Spring 2017   Heart murmur    Hemorrhoid    Hiatal hernia    HTN (hypertension)    Hyperlipidemia    Hypothyroidism    Iron  deficiency anemia due to chronic blood loss 03/29/2019   Iron  malabsorption 03/29/2019   Metastatic carcinoma (HCC) 2010   Dr Frank   Neuroendocrine cancer [facility]) 08/2021   Pancreatitis 1998   chronic    Past Surgical History:  Procedure Laterality Date   APPENDECTOMY  11/01/1960   CARDIAC CATHETERIZATION N/A 02/10/2016   Procedure: Left Heart Cath and Coronary Angiography;  Surgeon: Gordy Bergamo, MD;  Location: [facility] INVASIVE CV LAB;  Service: Cardiovascular;  Laterality: N/A;   CARDIAC CATHETERIZATION  02/10/2016   Procedure: Coronary/Graft Atherectomy;  Surgeon: Gordy Bergamo, MD;  Location: [facility] INVASIVE CV LAB;  Service: Cardiovascular;;   CARDIAC CATHETERIZATION  02/10/2016   Procedure: Coronary Stent Intervention;  Surgeon: Gordy Bergamo, MD;  Location: [facility] INVASIVE CV LAB;  Service: Cardiovascular;;   CATARACT EXTRACTION W/ INTRAOCULAR LENS  IMPLANT, BILATERAL Bilateral    CORONARY STENT INTERVENTION N/A 12/16/2021   Procedure: CORONARY STENT INTERVENTION;  Surgeon: Wendel Lurena POUR, MD;  Location: [facility] INVASIVE CV LAB;  Service: Cardiovascular;  Laterality: N/A;   IR GENERIC HISTORICAL  12/30/2015   IR RADIOLOGIST EVAL & MGMT 12/30/2015 Marcey Moan, MD [facility] INTERV RAD   IR GENERIC HISTORICAL  11/09/2016   IR RADIOLOGIST EVAL & MGMT 11/09/2016 Marcey Moan, MD [facility] INTERV RAD   IR RADIOLOGIST EVAL & MGMT  03/01/2017   IR RADIOLOGIST EVAL & MGMT  06/29/2017   LAPAROSCOPIC CHOLECYSTECTOMY  11/01/1997   LEFT HEART  CATH AND CORONARY ANGIOGRAPHY N/A 12/16/2021   Procedure: LEFT HEART CATH AND CORONARY ANGIOGRAPHY;  Surgeon: Wendel Lurena POUR, MD;  Location: [facility] INVASIVE CV LAB;  Service: Cardiovascular;  Laterality: N/A;   LIVER BIOPSY  11/01/2008   RADIOACTIVE SEED IMPLANT  X 3   to my liver   SMALL INTESTINE SURGERY  Section removed 2009   TUMOR EXCISION  01/30/2009   Carcinoil Resection    TUMOR REMOVAL     from small intestine    Social History[1]  Family History  Problem Relation Age of Onset   Kidney disease Mother    Hyperlipidemia Mother    Hypertension Mother    COPD Father    Ulcerative colitis Daughter    Colon cancer Neg Hx    Esophageal cancer Neg Hx    Rectal cancer Neg Hx    Stomach cancer Neg Hx     Allergies[2]  Medication list has been reviewed and updated.  Medications Ordered Prior  to Encounter[3]  Review of Systems:  As per HPI- otherwise negative.   Physical Examination: Vitals:   10/24/24 0909  BP: (!) 140/72  Pulse: 68  SpO2: 98%   Vitals:   10/24/24 0909  Weight: 186 lb 3.2 oz (84.5 kg)  Height: 5' 10 (1.778 m)   Body mass index is 26.72 kg/m. Ideal Body Weight: Weight in (lb) to have BMI = 25: 173.9  GEN: no acute distress. Normal weight, looks well HEENT: Atraumatic, Normocephalic.  Ears and Nose: No external deformity. CV: RRR, No M/G/R. No JVD. No thrill. No extra heart sounds. PULM: CTA B, no wheezes, crackles, rhonchi. No retractions. No resp. distress. No accessory muscle use. ABD: S, NT, ND EXTR: No c/c/e PSYCH: Normally interactive. Conversant.  Thoracolumbar flexion is decreased, extension is normal.  Normal strength, normal sensation, negative straight leg raise in both lower extremities. Patient indicates his left lower back, potentially involving the iliac crest as the side of his pain.  He does not have pain with range of motion of his hip  Wt Readings from Last 3 Encounters:  10/24/24 186 lb 3.2 oz (84.5 kg)  10/18/24 188  lb 12.8 oz (85.6 kg)  10/03/24 186 lb (84.4 kg)    Assessment and Plan: Metastatic malignant neuroendocrine tumor to liver [facility]) - Plan: MR Lumbar Spine Wo Contrast, MR Thoracic Spine Wo Contrast  Metastatic cancer to spine (HCC) - Plan: MR Lumbar Spine Wo Contrast, MR Thoracic Spine Wo Contrast  Chronic left-sided low back pain without sciatica - Plan: DG Hip Unilat W OR W/O Pelvis 2-3 Views Left, DG Lumbar Spine Complete, traMADol  (ULTRAM ) 50 MG tablet, MR Lumbar Spine Wo Contrast  Assessment & Plan Chronic left-sided low back pain with history of bony metastases Chronic left-sided low back pain with atypical pattern for muscle strain. - Ordered x-rays of the lower back and hip. - Ordered MRI of the spine to evaluate for metastatic involvement. - Prescribed tramadol  every 8 hours as needed. Cautioned about drowsiness and advised against concurrent alcohol use.  Currently he has been using only NSAIDs and notes the pain is still often severe, especially when he first wakes up in the morning.  He is aware that tramadol  is a narcotic and that it can be habit-forming - Continue Tylenol  and ibuprofen as needed.  Signed Harlene Schroeder, MD  Received x-rays, message to patient-plan to proceed with MRI DG Lumbar Spine Complete Result Date: 10/24/2024 CLINICAL DATA:  Fall.  Low back pain. EXAM: LUMBAR SPINE - COMPLETE 4+ VIEW COMPARISON:  None Available. FINDINGS: Marked compression deformity of T12 is stable since PET-CT of 09/05/2024. Facets are well aligned bilaterally. Loss of disc height evident at L4-5 and L5-S1. Rim calcified lesion in the right upper quadrant compatible with liver lesion better characterized on recent PET-CT. IMPRESSION: 1. No acute bony abnormality. 2. Marked compression deformity of T12, stable since PET-CT of 09/05/2024. 3. Degenerative disc disease at L4-5 and L5-S1. Electronically Signed   By: Camellia Candle M.D.   On: 10/24/2024 10:33   DG Hip Unilat W OR W/O Pelvis  2-3 Views Left Result Date: 10/24/2024 CLINICAL DATA:  Fall. Left hip pain. Personal history of prostate cancer with known bone mass to the left sacrum EXAM: DG HIP (WITH OR WITHOUT PELVIS) 2-3V LEFT COMPARISON:  06/30/2021 FINDINGS: No discrete bone lesion evident in the left sacrum on x-ray. SI joints and symphysis pubis unremarkable. No pubic ramus fracture. AP and frog-leg lateral views of the left hip show  no evidence for femoral neck fracture. IMPRESSION: 1. No acute bony abnormality. 2. No discrete bone lesion evident in the left sacrum on x-ray. Electronically Signed   By: Camellia Candle M.D.   On: 10/24/2024 10:31        [1]  Social History Tobacco Use   Smoking status: Former    Current packs/day: 0.00    Average packs/day: 1.5 packs/day for 38.2 years (57.4 ttl pk-yrs)    Types: Cigarettes    Start date: 09/25/1959    Quit date: 12/21/1997    Years since quitting: 26.8   Smokeless tobacco: Never  Vaping Use   Vaping status: Never Used  Substance Use Topics   Alcohol use: Yes    Alcohol/week: 3.0 - 4.0 standard drinks of alcohol    Types: 3 - 4 Cans of beer per week    Comment: 1-2 beers twice a week   Drug use: No  [2]  Allergies Allergen Reactions   Iohexol  Hives     Code: HIVES, Desc: PER [name] @ [facility]IMARY [facility], PT IS ALLERGIC TO CONTRAST DYE 10/02/08/RM  05/01/10...needs full premeds per our protocol w/ [facility]., Onset Date: 87977990    Iodinated Contrast Media Hives  [3]  Current Outpatient Medications on File Prior to Visit  Medication Sig Dispense Refill   Artificial Tear Solution (SOOTHE XP OP) Place 1 drop into both eyes daily as needed (dry eyes).     aspirin  EC 81 MG tablet Take 81 mg by mouth every morning.     atorvastatin  (LIPITOR) 80 MG tablet TAKE 1 TABLET BY MOUTH EVERY DAY 90 tablet 1   Cholecalciferol  1000 UNITS tablet Take 1,000 Units by mouth 2 (two) times daily.     clopidogrel  (PLAVIX ) 75 MG tablet TAKE 1 TABLET BY MOUTH EVERY DAY 90 tablet 3    diphenhydramine -acetaminophen  (TYLENOL  PM) 25-500 MG TABS tablet Take 1 tablet by mouth at bedtime as needed (sleep/pain).     fluticasone  (FLONASE ) 50 MCG/ACT nasal spray Place 2 sprays into both nostrils daily as needed (sinuses).  11   ipratropium (ATROVENT ) 0.03 % nasal spray Place 2 sprays into the nose 3 (three) times daily as needed for rhinitis. 30 mL 6   Lanreotide Acetate  (SOMATULINE DEPOT  [place]) Inject 120 mcg into the skin every 28 (twenty-eight) days. Receives at Dr Jessy office     lansoprazole  (PREVACID ) 30 MG capsule TAKE 1 CAPSULE (30 MG TOTAL) BY MOUTH DAILY AT 12 NOON. 90 capsule 1   levothyroxine  (SYNTHROID ) 50 MCG tablet TAKE 1 TABLET BY MOUTH EVERY DAY BEFORE BREAKFAST 90 tablet 1   LORazepam  (ATIVAN ) 0.5 MG tablet 1 tab po 30 minutes prior to radiation or MRI scans 5 tablet 0   losartan  (COZAAR ) 100 MG tablet Take 1 tablet (100 mg total) by mouth daily. 90 tablet 3   methocarbamol  (ROBAXIN ) 500 MG tablet Take 1 tablet (500 mg total) by mouth every 8 (eight) hours as needed for muscle spasms. 20 tablet 0   metoprolol  tartrate (LOPRESSOR ) 25 MG tablet TAKE 1/2 TABLET TWICE A DAY BY MOUTH (Patient taking differently: Take 12.5 mg by mouth 2 (two) times daily. Increased by Dr Timmy to 1 tablet daily due to elevated BPs.) 90 tablet 2   nitroGLYCERIN  (NITROSTAT ) 0.4 MG SL tablet PLACE 1 TABLET UNDER THE TONGUE EVERY 5 (FIVE) MINUTES X 3 DOSES AS NEEDED FOR CHEST PAIN. 25 tablet 0   ranolazine  (RANEXA ) 500 MG 12 hr tablet TAKE 1 TABLET BY MOUTH TWICE DAILY .  APPOINTMENT REQUIRED FOR FUTURE REFILLS 60 tablet 0   vitamin B-12 (CYANOCOBALAMIN ) 1000 MCG tablet Take 1,000 mcg by mouth every other day.     amLODipine  (NORVASC ) 5 MG tablet Take 1 tablet (5 mg total) by mouth daily. 90 tablet 0   hydrocortisone  (ANUSOL -HC) 25 MG suppository INSERT 1 SUPPOSITORY RECTALLY TWICE A DAY AS NEEDED FOR HEMORRHOIDS (Patient not taking: Reported on 10/24/2024) 30 suppository 0   Current  Facility-Administered Medications on File Prior to Visit  Medication Dose Route Frequency Provider Last Rate Last Admin   lanreotide acetate  (SOMATULINE DEPOT ) injection 120 mg  120 mg Subcutaneous Q14 Days Timmy Maude SAUNDERS, MD       "

## 2024-10-24 ENCOUNTER — Ambulatory Visit (HOSPITAL_BASED_OUTPATIENT_CLINIC_OR_DEPARTMENT_OTHER): Admission: RE | Admit: 2024-10-24 | Source: Ambulatory Visit

## 2024-10-24 ENCOUNTER — Ambulatory Visit (HOSPITAL_BASED_OUTPATIENT_CLINIC_OR_DEPARTMENT_OTHER)
Admission: RE | Admit: 2024-10-24 | Discharge: 2024-10-24 | Disposition: A | Discharge status: Home / Self Care | Source: Ambulatory Visit | Attending: Family Medicine | Admitting: Family Medicine

## 2024-10-24 ENCOUNTER — Encounter: Payer: Self-pay | Admitting: Family Medicine

## 2024-10-24 ENCOUNTER — Ambulatory Visit: Admitting: Family Medicine

## 2024-10-24 ENCOUNTER — Encounter (HOSPITAL_BASED_OUTPATIENT_CLINIC_OR_DEPARTMENT_OTHER): Payer: Self-pay

## 2024-10-24 ENCOUNTER — Ambulatory Visit (HOSPITAL_BASED_OUTPATIENT_CLINIC_OR_DEPARTMENT_OTHER)
Admission: RE | Admit: 2024-10-24 | Discharge: 2024-10-24 | Disposition: A | Source: Ambulatory Visit | Attending: Family Medicine | Admitting: Family Medicine

## 2024-10-24 VITALS — BP 140/72 | HR 68 | Ht 70.0 in | Wt 186.2 lb

## 2024-10-24 DIAGNOSIS — M5136 Other intervertebral disc degeneration, lumbar region with discogenic back pain only: Secondary | ICD-10-CM | POA: Diagnosis not present

## 2024-10-24 DIAGNOSIS — G8929 Other chronic pain: Secondary | ICD-10-CM | POA: Diagnosis not present

## 2024-10-24 DIAGNOSIS — M545 Low back pain, unspecified: Secondary | ICD-10-CM

## 2024-10-24 DIAGNOSIS — Z043 Encounter for examination and observation following other accident: Secondary | ICD-10-CM | POA: Diagnosis not present

## 2024-10-24 DIAGNOSIS — M25552 Pain in left hip: Secondary | ICD-10-CM | POA: Diagnosis not present

## 2024-10-24 DIAGNOSIS — C7B8 Other secondary neuroendocrine tumors: Secondary | ICD-10-CM | POA: Diagnosis not present

## 2024-10-24 DIAGNOSIS — C7951 Secondary malignant neoplasm of bone: Secondary | ICD-10-CM

## 2024-10-24 MED ORDER — TRAMADOL HCL 50 MG PO TABS: 50.0000 mg | ORAL_TABLET | Freq: Three times a day (TID) | ORAL | 0 refills | Status: AC | PRN

## 2024-10-24 NOTE — Patient Instructions (Signed)
 Good to see you today- please go to the ground floor to have x-rays and I will also order the MRI scans for you Tramadol  can be used as needed for pain- can make you a bit drowsy so be aware

## 2024-10-26 ENCOUNTER — Encounter: Payer: Self-pay | Admitting: Family Medicine

## 2024-10-29 ENCOUNTER — Other Ambulatory Visit

## 2024-11-05 ENCOUNTER — Encounter: Payer: Self-pay | Admitting: Family

## 2024-11-05 ENCOUNTER — Inpatient Hospital Stay

## 2024-11-05 ENCOUNTER — Inpatient Hospital Stay: Attending: Hematology & Oncology

## 2024-11-05 ENCOUNTER — Inpatient Hospital Stay: Admitting: Family

## 2024-11-05 VITALS — BP 134/58 | HR 68 | Temp 97.7°F | Resp 17 | Ht 70.0 in | Wt 184.0 lb

## 2024-11-05 DIAGNOSIS — D500 Iron deficiency anemia secondary to blood loss (chronic): Secondary | ICD-10-CM

## 2024-11-05 DIAGNOSIS — C7B8 Other secondary neuroendocrine tumors: Secondary | ICD-10-CM | POA: Insufficient documentation

## 2024-11-05 DIAGNOSIS — D509 Iron deficiency anemia, unspecified: Secondary | ICD-10-CM | POA: Insufficient documentation

## 2024-11-05 DIAGNOSIS — C7A8 Other malignant neuroendocrine tumors: Secondary | ICD-10-CM | POA: Insufficient documentation

## 2024-11-05 LAB — CBC WITH DIFFERENTIAL (CANCER CENTER ONLY)
Abs Immature Granulocytes: 0.02 K/uL (ref 0.00–0.07)
Basophils Absolute: 0 K/uL (ref 0.0–0.1)
Basophils Relative: 1 %
Eosinophils Absolute: 0.4 K/uL (ref 0.0–0.5)
Eosinophils Relative: 8 %
HCT: 34.5 % — ABNORMAL LOW (ref 39.0–52.0)
Hemoglobin: 11.3 g/dL — ABNORMAL LOW (ref 13.0–17.0)
Immature Granulocytes: 1 %
Lymphocytes Relative: 11 %
Lymphs Abs: 0.5 K/uL — ABNORMAL LOW (ref 0.7–4.0)
MCH: 31.9 pg (ref 26.0–34.0)
MCHC: 32.8 g/dL (ref 30.0–36.0)
MCV: 97.5 fL (ref 80.0–100.0)
Monocytes Absolute: 0.4 K/uL (ref 0.1–1.0)
Monocytes Relative: 9 %
Neutro Abs: 3.1 K/uL (ref 1.7–7.7)
Neutrophils Relative %: 70 %
Platelet Count: 131 K/uL — ABNORMAL LOW (ref 150–400)
RBC: 3.54 MIL/uL — ABNORMAL LOW (ref 4.22–5.81)
RDW: 14.3 % (ref 11.5–15.5)
WBC Count: 4.4 K/uL (ref 4.0–10.5)
nRBC: 0 % (ref 0.0–0.2)

## 2024-11-05 LAB — CMP (CANCER CENTER ONLY)
ALT: 16 U/L (ref 0–44)
AST: 25 U/L (ref 15–41)
Albumin: 4.1 g/dL (ref 3.5–5.0)
Alkaline Phosphatase: 64 U/L (ref 38–126)
Anion gap: 8 (ref 5–15)
BUN: 19 mg/dL (ref 8–23)
CO2: 31 mmol/L (ref 22–32)
Calcium: 9.4 mg/dL (ref 8.9–10.3)
Chloride: 102 mmol/L (ref 98–111)
Creatinine: 1.17 mg/dL (ref 0.61–1.24)
GFR, Estimated: >60 mL/min
Glucose, Bld: 127 mg/dL — ABNORMAL HIGH (ref 70–99)
Potassium: 4.7 mmol/L (ref 3.5–5.1)
Sodium: 141 mmol/L (ref 135–145)
Total Bilirubin: 0.6 mg/dL (ref 0.0–1.2)
Total Protein: 7.1 g/dL (ref 6.5–8.1)

## 2024-11-05 LAB — RETICULOCYTES
Immature Retic Fract: 12.1 % (ref 2.3–15.9)
RBC.: 3.62 MIL/uL — ABNORMAL LOW (ref 4.22–5.81)
Retic Count, Absolute: 41.3 K/uL (ref 19.0–186.0)
Retic Ct Pct: 1.1 % (ref 0.4–3.1)

## 2024-11-05 LAB — IRON AND IRON BINDING CAPACITY (CC-WL,HP ONLY)
Iron: 53 ug/dL (ref 45–182)
Saturation Ratios: 18 % (ref 17.9–39.5)
TIBC: 300 ug/dL (ref 250–450)
UIBC: 247 ug/dL

## 2024-11-05 LAB — FERRITIN: Ferritin: 231 ng/mL (ref 24–336)

## 2024-11-05 MED ORDER — LANREOTIDE ACETATE 120 MG/0.5ML ~~LOC~~ SOLN
120.0000 mg | SUBCUTANEOUS | Status: DC
  Administered 2024-11-05: 120 mg via SUBCUTANEOUS
  Filled 2024-11-05: qty 0.5

## 2024-11-05 NOTE — Patient Instructions (Signed)
 Lanreotide Injection What is this medication? LANREOTIDE (lan REE oh tide) treats high levels of growth hormone (acromegaly). It is used when other therapies have not worked well enough or cannot be tolerated. It works by reducing the amount of growth hormone your body makes. This reduces symptoms and the risk of health problems caused by too much growth hormone, such as diabetes and heart disease. It may also be used to treat neuroendocrine tumors, a cancer of the cells that release hormones and other substances in your body. It works by slowing down the release of these substances from the cells. This slows tumor growth. It also decreases the symptoms of carcinoid syndrome, such as flushing or diarrhea. This medicine may be used for other purposes; ask your health care provider or pharmacist if you have questions. COMMON BRAND NAME(S): Somatuline [place] What should I tell my care team before I take this medication? They need to know if you have any of these conditions: Diabetes Gallbladder disease Heart disease Kidney disease Liver disease Pancreatic disease Thyroid disease An unusual or allergic reaction to lanreotide, other medications, foods, dyes, or preservatives Pregnant or trying to get pregnant Breastfeeding How should I use this medication? This medication is injected under the skin. It is given by your care team in a hospital or clinic setting. Talk to your care team about the use of this medication in children. Special care may be needed. Overdosage: If you think you have taken too much of this medicine contact a poison control center or emergency room at once. NOTE: This medicine is only for you. Do not share this medicine with others. What if I miss a dose? Keep appointments for follow-up doses. It is important not to miss your dose. Call your care team if you are unable to keep an appointment. What may interact with this medication? Bromocriptine Cyclosporine Certain  medications for blood pressure, heart disease, irregular heartbeat Certain medications for diabetes Quinidine Terfenadine This list may not describe all possible interactions. Give your health care provider a list of all the medicines, herbs, non-prescription drugs, or dietary supplements you use. Also tell them if you smoke, drink alcohol, or use illegal drugs. Some items may interact with your medicine. What should I watch for while using this medication? Visit your care team for regular checks on your progress. Tell your care team if your symptoms do not start to get better or if they get worse. Your condition will be monitored carefully while you are receiving this medication. You may need blood work while you are taking this medication. This medication may increase blood sugar. The risk may be higher in patients who already have diabetes. Ask your care team what you can do to lower your risk of diabetes while taking this medication. Talk to your care team if you wish to become pregnant or think you may be pregnant. This medication can cause serious birth defects. Do not breast-feed while taking this medication and for 6 months after stopping therapy. This medication may cause infertility. Talk to your care team if you are concerned about your fertility. What side effects may I notice from receiving this medication? Side effects that you should report to your care team as soon as possible: Allergic reactions--skin rash, itching, hives, swelling of the face, lips, tongue, or throat Gallbladder problems--severe stomach pain, nausea, vomiting, fever High blood sugar (hyperglycemia)--increased thirst or amount of urine, unusual weakness or fatigue, blurry vision Increase in blood pressure Low blood sugar (hypoglycemia)--pale, blue or purple  skin or lips, sweating, fussiness, rapid heartbeat, poor feeding, low body temperature Low thyroid levels (hypothyroidism)--unusual weakness or fatigue,  increased sensitivity to cold, constipation, hair loss, dry skin, weight gain, feelings of depression Oily or light-colored stools, diarrhea, bloating, weight loss Slow heartbeat--dizziness, feeling faint or lightheaded, confusion, trouble breathing, unusual weakness or fatigue Side effects that usually do not require medical attention (report these to your care team if they continue or are bothersome): Diarrhea Dizziness Headache Muscle spasms Nausea Pain, redness, or irritation at injection site Stomach pain This list may not describe all possible side effects. Call your doctor for medical advice about side effects. You may report side effects to FDA at 1-800-FDA-1088. Where should I keep my medication? This medication is given in a hospital or clinic. It will not be stored at home. NOTE: This sheet is a summary. It may not cover all possible information. If you have questions about this medicine, talk to your doctor, pharmacist, or health care provider.  2024 Elsevier/Gold Standard (2023-09-30 00:00:00)

## 2024-11-05 NOTE — Progress Notes (Signed)
 " Hematology and Oncology Follow Up Visit  Brian Mays [id number] [birth year] 79 y.o. 11/05/2024   Principle Diagnosis:  Metastatic low grade neuroendocrine tumor-hepatic metastases Iron  deficiency anemia Erythropoietin  deficient anemia   Past Diagnosis:  S/p yttrium-90 intrahepatic therapy - November 2016 Lutathera  (Lu 177) injection on 02/01/2018 - s/p cycle 4 SBRT to T6 lesion -- 11/06/2021   Current Therapy:        Somatuline 120 mg q 4 week-start on 08/01/2023  IV Iron  as indicated   Lutathera -retreatment -09/06/2023 -- s/p 2/2 cycles Aranesp 300 mcg SQ q. 3 weeks for hemoglobin less than 10   Interim History:  Brian Mays is here today for follow-up and treatment. He is doing well but still having lower back pain. He is scheduled for MRI next week for further eval and treatment with ortho. No fever, chills, n/v, cough, rash, dizziness, SOB, chest pain, palpitations, abdominal pain or changes in bowel or bladder habits. No swelling, tenderness, numbness or tingling in his extremities. No new falls since we last saw him. No syncope. Appetite and hydration are good. Weight is stable at 184 lbs.   ECOG Performance Status: 1 - Symptomatic but completely ambulatory  Medications:  Allergies as of 11/05/2024       Reactions   Iohexol  Hives    Code: HIVES, Desc: PER [name] @ [facility]IMARY [facility], PT IS ALLERGIC TO CONTRAST DYE 10/02/08/RM  05/01/10...needs full premeds per our protocol w/ [facility]., Onset Date: [id number]   Iodinated Contrast Media Hives        Medication List        Accurate as of November 05, 2024  8:45 AM. If you have any questions, ask your nurse or doctor.          amLODipine  5 MG tablet Commonly known as: NORVASC  Take 1 tablet (5 mg total) by mouth daily.   aspirin  EC 81 MG tablet Take 81 mg by mouth every morning.   atorvastatin  80 MG tablet Commonly known as: LIPITOR TAKE 1 TABLET BY MOUTH EVERY DAY   Cholecalciferol  25 MCG (1000 UT) tablet Take 1,000  Units by mouth 2 (two) times daily.   clopidogrel  75 MG tablet Commonly known as: PLAVIX  TAKE 1 TABLET BY MOUTH EVERY DAY   cyanocobalamin  1000 MCG tablet Commonly known as: VITAMIN B12 Take 1,000 mcg by mouth every other day.   diphenhydramine -acetaminophen  25-500 MG Tabs tablet Commonly known as: TYLENOL  PM Take 1 tablet by mouth at bedtime as needed (sleep/pain).   fluticasone  50 MCG/ACT nasal spray Commonly known as: FLONASE  Place 2 sprays into both nostrils daily as needed (sinuses).   hydrocortisone  25 MG suppository Commonly known as: ANUSOL -HC INSERT 1 SUPPOSITORY RECTALLY TWICE A DAY AS NEEDED FOR HEMORRHOIDS   ipratropium 0.03 % nasal spray Commonly known as: ATROVENT  Place 2 sprays into the nose 3 (three) times daily as needed for rhinitis.   lansoprazole  30 MG capsule Commonly known as: PREVACID  TAKE 1 CAPSULE (30 MG TOTAL) BY MOUTH DAILY AT 12 NOON.   levothyroxine  50 MCG tablet Commonly known as: SYNTHROID  TAKE 1 TABLET BY MOUTH EVERY DAY BEFORE BREAKFAST   LORazepam  0.5 MG tablet Commonly known as: Ativan  1 tab po 30 minutes prior to radiation or MRI scans   losartan  100 MG tablet Commonly known as: COZAAR  Take 1 tablet (100 mg total) by mouth daily.   methocarbamol  500 MG tablet Commonly known as: ROBAXIN  Take 1 tablet (500 mg total) by mouth every 8 (eight) hours as needed for  muscle spasms.   metoprolol  tartrate 25 MG tablet Commonly known as: LOPRESSOR  TAKE 1/2 TABLET TWICE A DAY BY MOUTH What changed: See the new instructions.   nitroGLYCERIN  0.4 MG SL tablet Commonly known as: NITROSTAT  PLACE 1 TABLET UNDER THE TONGUE EVERY 5 (FIVE) MINUTES X 3 DOSES AS NEEDED FOR CHEST PAIN.   ranolazine  500 MG 12 hr tablet Commonly known as: RANEXA  TAKE 1 TABLET BY MOUTH TWICE DAILY . APPOINTMENT REQUIRED FOR FUTURE REFILLS   SOMATULINE DEPOT  [place] Inject 120 mcg into the skin every 28 (twenty-eight) days. Receives at Dr Jessy office   SOOTHE XP  OP Place 1 drop into both eyes daily as needed (dry eyes).        Allergies: Allergies[1]  Past Medical History, Surgical history, Social history, and Family History were reviewed and updated.  Review of Systems: All other 10 point review of systems is negative.   Physical Exam:  vitals were not taken for this visit.   Wt Readings from Last 3 Encounters:  10/24/24 186 lb 3.2 oz (84.5 kg)  10/18/24 188 lb 12.8 oz (85.6 kg)  10/03/24 186 lb (84.4 kg)    Ocular: Sclerae unicteric, pupils equal, round and reactive to light Ear-nose-throat: Oropharynx clear, dentition fair Lymphatic: No cervical or supraclavicular adenopathy Lungs no rales or rhonchi, good excursion bilaterally Heart regular rate and rhythm, no murmur appreciated Abd soft, nontender, positive bowel sounds MSK no focal spinal tenderness, no joint edema Neuro: non-focal, well-oriented, appropriate affect Breasts: Deferred   Lab Results  Component Value Date   WBC 5.0 10/03/2024   HGB 11.3 (L) 10/03/2024   HCT 34.7 (L) 10/03/2024   MCV 97.2 10/03/2024   PLT 137 (L) 10/03/2024   Lab Results  Component Value Date   FERRITIN 222 10/03/2024   IRON  67 10/03/2024   TIBC 321 10/03/2024   UIBC 254 10/03/2024   IRONPCTSAT 21 10/03/2024   Lab Results  Component Value Date   RETICCTPCT 1.6 10/03/2024   RBC 3.55 (L) 10/03/2024   RBC 3.57 (L) 10/03/2024   No results found for: KPAFRELGTCHN, LAMBDASER, KAPLAMBRATIO No results found for: IGGSERUM, IGA, IGMSERUM No results found for: STEPHANY CARLOTA BENSON MARKEL EARLA JOANNIE DOC VICK, SPEI   Chemistry      Component Value Date/Time   NA 144 10/03/2024 0837   NA 141 06/22/2022 0942   NA 144 10/04/2017 0904   NA 141 07/23/2016 1255   K 4.6 10/03/2024 0837   K 3.8 10/04/2017 0904   K 4.1 07/23/2016 1255   CL 106 10/03/2024 0837   CL 103 10/04/2017 0904   CO2 28 10/03/2024 0837   CO2 28 10/04/2017 0904   CO2 27  07/23/2016 1255   BUN 18 10/03/2024 0837   BUN 20 06/22/2022 0942   BUN 14 10/04/2017 0904   BUN 13.8 07/23/2016 1255   CREATININE 1.18 10/03/2024 0837   CREATININE 0.8 10/04/2017 0904   CREATININE 1.0 07/23/2016 1255      Component Value Date/Time   CALCIUM  9.5 10/03/2024 0837   CALCIUM  9.4 10/04/2017 0904   CALCIUM  9.2 07/23/2016 1255   ALKPHOS 57 10/03/2024 0837   ALKPHOS 64 10/04/2017 0904   ALKPHOS 77 07/23/2016 1255   AST 28 10/03/2024 0837   AST 23 07/23/2016 1255   ALT 16 10/03/2024 0837   ALT 22 10/04/2017 0904   ALT 13 07/23/2016 1255   BILITOT 0.7 10/03/2024 0837   BILITOT 1.13 07/23/2016 1255       Impression  and Plan: Mr. Moten is a very pleasant 79 yo caucasian gentleman with metastatic low-grade neuroendocrine carcinoma with hepatic metastasis and bone metastasis.   Chromogranin A pending. Last month level was 149 (previously 170).  He will continue on his Somatuline.   No ESA needed this visit.  Follow-up in 1 month.   Lauraine Pepper, NP 1/5/20268:45 AM     [1]  Allergies Allergen Reactions   Iohexol  Hives     Code: HIVES, Desc: PER [name] @ [facility]IMARY [facility], PT IS ALLERGIC TO CONTRAST DYE 10/02/08/RM  05/01/10...needs full premeds per our protocol w/ [facility]., Onset Date: [id number]    Iodinated Contrast Media Hives   "

## 2024-11-07 ENCOUNTER — Encounter: Payer: Self-pay | Admitting: Family Medicine

## 2024-11-07 ENCOUNTER — Encounter: Payer: Self-pay | Admitting: Family

## 2024-11-07 LAB — CHROMOGRANIN A: Chromogranin A (ng/mL): 86.7 ng/mL (ref 0.0–101.8)

## 2024-11-12 ENCOUNTER — Ambulatory Visit
Admission: RE | Admit: 2024-11-12 | Discharge: 2024-11-12 | Disposition: A | Source: Ambulatory Visit | Attending: Family Medicine | Admitting: Family Medicine

## 2024-11-12 DIAGNOSIS — G8929 Other chronic pain: Secondary | ICD-10-CM

## 2024-11-12 DIAGNOSIS — C7B8 Other secondary neuroendocrine tumors: Secondary | ICD-10-CM

## 2024-11-12 DIAGNOSIS — C7951 Secondary malignant neoplasm of bone: Secondary | ICD-10-CM

## 2024-11-14 ENCOUNTER — Ambulatory Visit: Admitting: Orthopaedic Surgery

## 2024-11-14 DIAGNOSIS — M545 Low back pain, unspecified: Secondary | ICD-10-CM

## 2024-11-14 DIAGNOSIS — G8929 Other chronic pain: Secondary | ICD-10-CM

## 2024-11-14 NOTE — Progress Notes (Signed)
 "  Office Visit Note   Patient: Brian Mays           Date of Birth: [birth year]           MRN: [id number] Visit Date: 11/14/2024              Requested by: Brian Harlene BROCKS, MD [address] [place],  [place] [address] PCP: Brian Harlene BROCKS, MD   Assessment & Plan: Visit Diagnoses:  1. Chronic left-sided low back pain without sciatica     Plan: Impression is 79 year old gentleman with left low back pain.  Not presenting with any radicular symptoms.  I did review the MRI of the spine but the report is not available at this time.  His T12 is collapsed which could be a metastatic lesion.  Based on his prior MRI in 2024 this is a new finding.  I am not sure if his pain is coming from T12 or from facet disease.  I recommend that he follow-up on the MRI report and he should follow-up with neurosurgery to discuss management of the T12 lesion and back pain.  In the meantime I have prescribed physical therapy, Medrol  Dosepak and Robaxin .  Follow-Up Instructions: No follow-ups on file.   Orders:  No orders of the defined types were placed in this encounter.  No orders of the defined types were placed in this encounter.     Procedures: No procedures performed   Clinical Data: No additional findings.   Subjective: Chief Complaint  Patient presents with   Left Hip - Pain    HPI Patient is a very pleasant 79 year old gentleman who comes in for evaluation of left low back pain.  States he has pain when he wakes up and moves around for about the first 15 minutes.  Denies any radicular symptoms.  Recently had a fall and aggravated.  He has been sleeping in a recliner to help with the pain.  He is very active at baseline.  He golfs bikes and walks.  Recently had an MRI of the thoracic and lumbar spine that was ordered by PCP.  He has a history of metastatic malignant neuroendocrine tumor.  He denies any bowel or bladder changes.  Denies any groin pain. Review of Systems   Constitutional: Negative.   HENT: Negative.    Eyes: Negative.   Respiratory: Negative.    Cardiovascular: Negative.   Gastrointestinal: Negative.   Endocrine: Negative.   Genitourinary: Negative.   Skin: Negative.   Allergic/Immunologic: Negative.   Neurological: Negative.   Hematological: Negative.   Psychiatric/Behavioral: Negative.    All other systems reviewed and are negative.    Objective: Vital Signs: There were no vitals taken for this visit.  Physical Exam Vitals and nursing note reviewed.  Constitutional:      Appearance: He is well-developed.  HENT:     Head: Normocephalic and atraumatic.  Eyes:     Pupils: Pupils are equal, round, and reactive to light.  Pulmonary:     Effort: Pulmonary effort is normal.  Abdominal:     Palpations: Abdomen is soft.  Musculoskeletal:        General: Normal range of motion.     Cervical back: Neck supple.  Skin:    General: Skin is warm.  Neurological:     Mental Status: He is alert and oriented to person, place, and time.  Psychiatric:        Behavior: Behavior normal.  Thought Content: Thought content normal.        Judgment: Judgment normal.     Ortho Exam Examination shows pain pain to the left low back.  He has no hip symptoms.  He has no motor or sensory weaknesses or deficits in the lower extremities.  Normal reflexes. Specialty Comments:  No specialty comments available.  Imaging: No results found.   PMFS History: Patient Active Problem List   Diagnosis Date Noted   Metastatic cancer to spine (HCC) 02/08/2022   Prediabetes 09/03/2021   Near syncope 04/22/2020   Iron  deficiency anemia due to chronic blood loss 03/29/2019   Iron  malabsorption 03/29/2019   Lumbar radiculopathy 08/16/2017   CAD (coronary artery disease) 09/21/2016   Unstable angina (HCC) 02/09/2016   Vitamin D  deficiency 03/11/2015   Metastatic malignant neuroendocrine tumor to liver (HCC) 02/14/2015   Hyperglycemia 04/03/2012    Diarrhea 03/29/2012   B12 deficiency 04/19/2011   Antiplatelet or antithrombotic long-term use 03/30/2011   TOBACCO USE, QUIT 10/13/2009   Unspecified vitamin D  deficiency 06/19/2009   Neoplasm by body site 12/23/2008   DIVERTICULOSIS, COLON 11/08/2008   ABDOMINAL PAIN 10/02/2008   Hypothyroidism 12/13/2007   HEMORRHOIDS, NOS 12/13/2007   Belching 12/13/2007   History of colonic polyps 12/13/2007   Essential hypertension 09/15/2007   Gastroesophageal reflux disease 09/15/2007   PANCREATITIS, CHRONIC 09/15/2007   CARDIAC MURMUR 09/15/2007   SNORING 09/15/2007   Past Medical History:  Diagnosis Date   Allergy 1999   CT dye   Anemia    in past   Blood transfusion without reported diagnosis    Cataract    Clotting disorder Current   Bleeding fro tumors on liver   Colon polyps    Diverticulosis    Gallstones    GERD (gastroesophageal reflux disease)    Heart attack (HCC)    mild, Spring 2017   Heart murmur    Hemorrhoid    Hiatal hernia    HTN (hypertension)    Hyperlipidemia    Hypothyroidism    Iron  deficiency anemia due to chronic blood loss 03/29/2019   Iron  malabsorption 03/29/2019   Metastatic carcinoma (HCC) 2010   Dr Frank   Neuroendocrine cancer [facility]) 08/2021   Pancreatitis 1998   chronic    Family History  Problem Relation Age of Onset   Kidney disease Mother    Hyperlipidemia Mother    Hypertension Mother    COPD Father    Ulcerative colitis Daughter    Colon cancer Neg Hx    Esophageal cancer Neg Hx    Rectal cancer Neg Hx    Stomach cancer Neg Hx     Past Surgical History:  Procedure Laterality Date   APPENDECTOMY  11/01/1960   CARDIAC CATHETERIZATION N/A 02/10/2016   Procedure: Left Heart Cath and Coronary Angiography;  Surgeon: Gordy Bergamo, MD;  Location: [facility] INVASIVE CV LAB;  Service: Cardiovascular;  Laterality: N/A;   CARDIAC CATHETERIZATION  02/10/2016   Procedure: Coronary/Graft Atherectomy;  Surgeon: Gordy Bergamo, MD;  Location: [facility]  INVASIVE CV LAB;  Service: Cardiovascular;;   CARDIAC CATHETERIZATION  02/10/2016   Procedure: Coronary Stent Intervention;  Surgeon: Gordy Bergamo, MD;  Location[facility] INVASIVE CV LAB;  Service: Cardiovascular;;   CATARACT EXTRACTION W/ INTRAOCULAR LENS  IMPLANT, BILATERAL Bilateral    CORONARY STENT INTERVENTION N/A 12/16/2021   Procedure: CORONARY STENT INTERVENTION;  Surgeon: Wendel Lurena POUR, MD;  Location: [facility] INVASIVE CV LAB;  Service: Cardiovascular;  Laterality: N/A;   IR GENERIC HISTORICAL  12/30/2015   IR RADIOLOGIST EVAL & MGMT 12/30/2015 Marcey Moan, MD [facility] INTERV RAD   IR GENERIC HISTORICAL  11/09/2016   IR RADIOLOGIST EVAL & MGMT 11/09/2016 Marcey Moan, MD [facility] INTERV RAD   IR RADIOLOGIST EVAL & MGMT  03/01/2017   IR RADIOLOGIST EVAL & MGMT  06/29/2017   LAPAROSCOPIC CHOLECYSTECTOMY  11/01/1997   LEFT HEART CATH AND CORONARY ANGIOGRAPHY N/A 12/16/2021   Procedure: LEFT HEART CATH AND CORONARY ANGIOGRAPHY;  Surgeon: Wendel Lurena POUR, MD;  Location: [facility] INVASIVE CV LAB;  Service: Cardiovascular;  Laterality: N/A;   LIVER BIOPSY  11/01/2008   RADIOACTIVE SEED IMPLANT  X 3   to my liver   SMALL INTESTINE SURGERY  Section removed 2009   TUMOR EXCISION  01/30/2009   Carcinoil Resection    TUMOR REMOVAL     from small intestine   Social History   Occupational History   Occupation: Investment Banker, Corporate: [employer]  Tobacco Use   Smoking status: Former    Current packs/day: 0.00    Average packs/day: 1.5 packs/day for 38.2 years (57.4 ttl pk-yrs)    Types: Cigarettes    Start date: 09/25/1959    Quit date: 12/21/1997    Years since quitting: 26.9   Smokeless tobacco: Never  Vaping Use   Vaping status: Never Used  Substance and Sexual Activity   Alcohol use: Yes    Alcohol/week: 3.0 - 4.0 standard drinks of alcohol    Types: 3 - 4 Cans of beer per week    Comment: 1-2 beers twice a week   Drug use: No   Sexual activity: Yes        "

## 2024-11-16 ENCOUNTER — Encounter: Payer: Self-pay | Admitting: Orthopaedic Surgery

## 2024-11-17 MED ORDER — METHYLPREDNISOLONE 4 MG PO TBPK: ORAL_TABLET | ORAL | 0 refills | Status: DC

## 2024-11-17 MED ORDER — METHOCARBAMOL 500 MG PO TABS: 500.0000 mg | ORAL_TABLET | Freq: Four times a day (QID) | ORAL | 2 refills | Status: DC | PRN

## 2024-11-17 NOTE — Telephone Encounter (Signed)
 done

## 2024-11-19 ENCOUNTER — Ambulatory Visit: Payer: Self-pay | Admitting: Family Medicine

## 2024-11-19 ENCOUNTER — Other Ambulatory Visit: Payer: Self-pay | Admitting: Family Medicine

## 2024-11-19 ENCOUNTER — Other Ambulatory Visit: Payer: Self-pay

## 2024-11-19 DIAGNOSIS — C7951 Secondary malignant neoplasm of bone: Secondary | ICD-10-CM

## 2024-11-19 NOTE — Progress Notes (Signed)
 Called pt to go over her recent thoracolumbar MRI    IMPRESSION: 1. Known osseous metastatic disease in the lumbar spine has progressed from previous MRI's but appears similar to more recent PET-CT's. Thoracic involvement aside from T12 is grossly unchanged. 2. Known T12 fracture is grossly stable from recent PET CT's, although new compared with previous MRI. This fracture is associated with surrounding marrow changes in the posterior elements which could relate to the fracture or underlying tumor (and may contribute to the patient's pain). Grossly stable osseous retropulsion without cord deformity. 3. No evidence of pathologic fracture in the lumbar spine. 4. Mildly progressive spondylosis at L4-5 with a broad-based central disc protrusion contributing to moderate spinal stenosis and narrowing of the lateral recesses. 5. Stable chronic foraminal narrowing bilaterally at L5-S1. 6. Grossly stable hepatic metastatic disease.    It looks like his findings are relatively stable although perhaps some progression.  He saw Dr. Jerri with Ortho recently who recommended a neurosurgical consultation.  I will go ahead and place this referral and also send a copy of his MRI report to his oncologist Dr. Timmy.  Patient is in agreement with plan.  He wonders if he could try some dry needling as well, I advised him this is fine

## 2024-11-21 ENCOUNTER — Inpatient Hospital Stay

## 2024-11-21 ENCOUNTER — Inpatient Hospital Stay: Admitting: Hematology & Oncology

## 2024-11-21 ENCOUNTER — Encounter: Payer: Self-pay | Admitting: Hematology & Oncology

## 2024-11-21 VITALS — BP 149/68 | HR 58 | Temp 98.2°F | Resp 20 | Ht 70.0 in | Wt 186.4 lb

## 2024-11-21 DIAGNOSIS — C7B8 Other secondary neuroendocrine tumors: Secondary | ICD-10-CM | POA: Diagnosis not present

## 2024-11-21 DIAGNOSIS — K769 Liver disease, unspecified: Secondary | ICD-10-CM | POA: Diagnosis not present

## 2024-11-21 DIAGNOSIS — C7A8 Other malignant neuroendocrine tumors: Secondary | ICD-10-CM | POA: Diagnosis not present

## 2024-11-21 DIAGNOSIS — D500 Iron deficiency anemia secondary to blood loss (chronic): Secondary | ICD-10-CM

## 2024-11-21 LAB — CBC WITH DIFFERENTIAL (CANCER CENTER ONLY)
Abs Immature Granulocytes: 0.08 K/uL — ABNORMAL HIGH (ref 0.00–0.07)
Basophils Absolute: 0 K/uL (ref 0.0–0.1)
Basophils Relative: 0 %
Eosinophils Absolute: 0 K/uL (ref 0.0–0.5)
Eosinophils Relative: 0 %
HCT: 34.2 % — ABNORMAL LOW (ref 39.0–52.0)
Hemoglobin: 11.1 g/dL — ABNORMAL LOW (ref 13.0–17.0)
Immature Granulocytes: 1 %
Lymphocytes Relative: 9 %
Lymphs Abs: 0.7 K/uL (ref 0.7–4.0)
MCH: 31.6 pg (ref 26.0–34.0)
MCHC: 32.5 g/dL (ref 30.0–36.0)
MCV: 97.4 fL (ref 80.0–100.0)
Monocytes Absolute: 0.6 K/uL (ref 0.1–1.0)
Monocytes Relative: 7 %
Neutro Abs: 6.8 K/uL (ref 1.7–7.7)
Neutrophils Relative %: 83 %
Platelet Count: 179 K/uL (ref 150–400)
RBC: 3.51 MIL/uL — ABNORMAL LOW (ref 4.22–5.81)
RDW: 14.6 % (ref 11.5–15.5)
WBC Count: 8.3 K/uL (ref 4.0–10.5)
nRBC: 0 % (ref 0.0–0.2)

## 2024-11-21 LAB — IRON AND IRON BINDING CAPACITY (CC-WL,HP ONLY)
Iron: 79 ug/dL (ref 45–182)
Saturation Ratios: 26 % (ref 17.9–39.5)
TIBC: 308 ug/dL (ref 250–450)
UIBC: 229 ug/dL

## 2024-11-21 LAB — CMP (CANCER CENTER ONLY)
ALT: 13 U/L (ref 0–44)
AST: 19 U/L (ref 15–41)
Albumin: 3.9 g/dL (ref 3.5–5.0)
Alkaline Phosphatase: 60 U/L (ref 38–126)
Anion gap: 9 (ref 5–15)
BUN: 26 mg/dL — ABNORMAL HIGH (ref 8–23)
CO2: 28 mmol/L (ref 22–32)
Calcium: 9.3 mg/dL (ref 8.9–10.3)
Chloride: 103 mmol/L (ref 98–111)
Creatinine: 1.21 mg/dL (ref 0.61–1.24)
GFR, Estimated: >60 mL/min
Glucose, Bld: 190 mg/dL — ABNORMAL HIGH (ref 70–99)
Potassium: 4.6 mmol/L (ref 3.5–5.1)
Sodium: 140 mmol/L (ref 135–145)
Total Bilirubin: 0.6 mg/dL (ref 0.0–1.2)
Total Protein: 6.9 g/dL (ref 6.5–8.1)

## 2024-11-21 LAB — FERRITIN: Ferritin: 180 ng/mL (ref 24–336)

## 2024-11-21 LAB — RETICULOCYTES
Immature Retic Fract: 18.2 % — ABNORMAL HIGH (ref 2.3–15.9)
RBC.: 3.5 MIL/uL — ABNORMAL LOW (ref 4.22–5.81)
Retic Count, Absolute: 51.5 K/uL (ref 19.0–186.0)
Retic Ct Pct: 1.5 % (ref 0.4–3.1)

## 2024-11-21 NOTE — Progress Notes (Signed)
 " Hematology and Oncology Follow Up Visit  Brian Mays [id number] [birth year] 79 y.o. 11/21/2024   Principle Diagnosis:  Metastatic low grade neuroendocrine tumor-hepatic metastases Iron  deficiency anemia Erythropoietin  deficient anemia   Past Diagnosis:  S/p yttrium-90 intrahepatic therapy - November 2016 Lutathera  (Lu 177) injection on 02/01/2018 - s/p cycle 4 SBRT to T6 lesion -- 11/06/2021   Current Therapy:        Somatuline 120 mg q 4 week-start on 08/01/2023  IV Iron  as indicated   Lutathera -retreatment -09/06/2023 -- s/p 2/2 cycles Aranesp 300 mcg SQ q. 3 weeks for hemoglobin less than 10   Interim History:  Brian Mays is here today for an early follow-up.  He was seen by Dr. Watt because of continued lower back pain.  He does have issues with arthritis in the back.  However, he has had radiosurgery for a T6 lesion 3 years ago..  The MRI was done of the thoracic spine and lumbar spine.  This was done on 11/12/2024.  The MRI of the spine showed that there was a T12 fracture which appeared to be new.  This is associated with some surrounding marrow changes.  He does have significant spinal stenosis at L4-L5.  He also has foraminal narrowing at L5-S1.  It really is hard to say how much of this is his malignancy.  Apparently on the MRI, he has a lesion at the sacrum at S2 measuring 1.3 cm.  I really think we are going to have to get a dotatate PET scan on him to see exactly what is going on.  Of note, his Chromogranin A levels have been coming down nicely.  Back in November the Chromogranin A level was 170.  When we checked 2 weeks ago it was down to 87.  He feels that his back problems are more arthritis in anything else.  He has had no problems with diarrhea.  He has had no obvious change in bowel or bladder habits.  He has had no cough.  He has had no nausea or vomiting.  He has had no leg pain or swelling.  There is no weakness in the legs.  Overall, I will say that his  performance status is probably ECOG 1.   Medications:  Allergies as of 11/21/2024       Reactions   Iohexol  Hives    Code: HIVES, Desc: PER [name] @ [facility], PT IS ALLERGIC TO CONTRAST DYE 10/02/08/RM  05/01/10...needs full premeds per our protocol w/ [facility]., Onset Date: [id number]   Iodinated Contrast Media Hives        Medication List        Accurate as of November 21, 2024 11:44 AM. If you have any questions, ask your nurse or doctor.          amLODipine  5 MG tablet Commonly known as: NORVASC  Take 1 tablet (5 mg total) by mouth daily.   aspirin  EC 81 MG tablet Take 81 mg by mouth every morning.   atorvastatin  80 MG tablet Commonly known as: LIPITOR TAKE 1 TABLET BY MOUTH EVERY DAY   Cholecalciferol  25 MCG (1000 UT) tablet Take 1,000 Units by mouth 2 (two) times daily.   clopidogrel  75 MG tablet Commonly known as: PLAVIX  TAKE 1 TABLET BY MOUTH EVERY DAY   cyanocobalamin  1000 MCG tablet Commonly known as: VITAMIN B12 Take 1,000 mcg by mouth every other day.   diphenhydramine -acetaminophen  25-500 MG Tabs tablet Commonly known as: TYLENOL  PM Take 1 tablet by  mouth at bedtime as needed (sleep/pain).   fluticasone  50 MCG/ACT nasal spray Commonly known as: FLONASE  Place 2 sprays into both nostrils daily as needed (sinuses).   hydrocortisone  25 MG suppository Commonly known as: ANUSOL -HC INSERT 1 SUPPOSITORY RECTALLY TWICE A DAY AS NEEDED FOR HEMORRHOIDS   ipratropium 0.03 % nasal spray Commonly known as: ATROVENT  Place 2 sprays into the nose 3 (three) times daily as needed for rhinitis.   lansoprazole  30 MG capsule Commonly known as: PREVACID  TAKE 1 CAPSULE (30 MG TOTAL) BY MOUTH DAILY AT 12 NOON.   levothyroxine  50 MCG tablet Commonly known as: SYNTHROID  TAKE 1 TABLET BY MOUTH EVERY DAY BEFORE BREAKFAST   LORazepam  0.5 MG tablet Commonly known as: Ativan  1 tab po 30 minutes prior to radiation or MRI scans   losartan  100 MG tablet Commonly  known as: COZAAR  Take 1 tablet (100 mg total) by mouth daily.   methocarbamol  500 MG tablet Commonly known as: ROBAXIN  Take 1 tablet (500 mg total) by mouth every 6 (six) hours as needed for muscle spasms.   methylPREDNISolone  4 MG Tbpk tablet Commonly known as: MEDROL  DOSEPAK Take as directed   metoprolol  tartrate 25 MG tablet Commonly known as: LOPRESSOR  TAKE 1/2 TABLET TWICE A DAY BY MOUTH   nitroGLYCERIN  0.4 MG SL tablet Commonly known as: NITROSTAT  PLACE 1 TABLET UNDER THE TONGUE EVERY 5 (FIVE) MINUTES X 3 DOSES AS NEEDED FOR CHEST PAIN.   ranolazine  500 MG 12 hr tablet Commonly known as: RANEXA  TAKE 1 TABLET BY MOUTH TWICE DAILY . APPOINTMENT REQUIRED FOR FUTURE REFILLS   SOMATULINE DEPOT  [place] Inject 120 mcg into the skin every 28 (twenty-eight) days. Receives at Dr Jessy office   SOOTHE XP OP Place 1 drop into both eyes daily as needed (dry eyes).        Allergies: Allergies[1]  Past Medical History, Surgical history, Social history, and Family History were reviewed and updated.  Review of Systems:  Review of Systems  Constitutional: Negative.   HENT: Negative.    Eyes: Negative.   Respiratory: Negative.    Cardiovascular: Negative.   Gastrointestinal: Negative.   Genitourinary: Negative.   Musculoskeletal:  Positive for back pain.  Skin: Negative.   Neurological: Negative.   Endo/Heme/Allergies: Negative.   Psychiatric/Behavioral: Negative.       Physical Exam:  height is 5' 10 (1.778 m) and weight is 186 lb 6.4 oz (84.6 kg). His oral temperature is 98.2 F (36.8 C). His blood pressure is 145/68 (abnormal, pended) and his pulse is 58 (abnormal). His respiration is 20 and oxygen saturation is 100%.   Wt Readings from Last 3 Encounters:  11/21/24 186 lb 6.4 oz (84.6 kg)  11/05/24 184 lb (83.5 kg)  10/24/24 186 lb 3.2 oz (84.5 kg)   Physical Exam Vitals reviewed.  HENT:     Head: Normocephalic and atraumatic.  Eyes:     Pupils: Pupils are  equal, round, and reactive to light.  Cardiovascular:     Rate and Rhythm: Normal rate and regular rhythm.     Heart sounds: Normal heart sounds.  Pulmonary:     Effort: Pulmonary effort is normal.     Breath sounds: Normal breath sounds.  Abdominal:     General: Bowel sounds are normal.     Palpations: Abdomen is soft.  Musculoskeletal:        General: No tenderness or deformity. Normal range of motion.     Cervical back: Normal range of motion.  Comments: He does have some tenderness to palpation in the lower back.  This probably is about the junction of the thoracic and lumbar spine area.  Lymphadenopathy:     Cervical: No cervical adenopathy.  Skin:    General: Skin is warm and dry.     Findings: No erythema or rash.  Neurological:     Mental Status: He is alert and oriented to person, place, and time.  Psychiatric:        Behavior: Behavior normal.        Thought Content: Thought content normal.        Judgment: Judgment normal.      Lab Results  Component Value Date   WBC 8.3 11/21/2024   HGB 11.1 (L) 11/21/2024   HCT 34.2 (L) 11/21/2024   MCV 97.4 11/21/2024   PLT 179 11/21/2024   Lab Results  Component Value Date   FERRITIN 231 11/05/2024   IRON  53 11/05/2024   TIBC 300 11/05/2024   UIBC 247 11/05/2024   IRONPCTSAT 18 11/05/2024   Lab Results  Component Value Date   RETICCTPCT 1.5 11/21/2024   RBC 3.51 (L) 11/21/2024   RBC 3.50 (L) 11/21/2024   No results found for: KPAFRELGTCHN, LAMBDASER, KAPLAMBRATIO No results found for: IGGSERUM, IGA, IGMSERUM No results found for: STEPHANY CARLOTA BENSON MARKEL EARLA JOANNIE DOC VICK, SPEI   Chemistry      Component Value Date/Time   NA 140 11/21/2024 1023   NA 141 06/22/2022 0942   NA 144 10/04/2017 0904   NA 141 07/23/2016 1255   K 4.6 11/21/2024 1023   K 3.8 10/04/2017 0904   K 4.1 07/23/2016 1255   CL 103 11/21/2024 1023   CL 103 10/04/2017 0904   CO2 28  11/21/2024 1023   CO2 28 10/04/2017 0904   CO2 27 07/23/2016 1255   BUN 26 (H) 11/21/2024 1023   BUN 20 06/22/2022 0942   BUN 14 10/04/2017 0904   BUN 13.8 07/23/2016 1255   CREATININE 1.21 11/21/2024 1023   CREATININE 0.8 10/04/2017 0904   CREATININE 1.0 07/23/2016 1255      Component Value Date/Time   CALCIUM  9.3 11/21/2024 1023   CALCIUM  9.4 10/04/2017 0904   CALCIUM  9.2 07/23/2016 1255   ALKPHOS 60 11/21/2024 1023   ALKPHOS 64 10/04/2017 0904   ALKPHOS 77 07/23/2016 1255   AST 19 11/21/2024 1023   AST 23 07/23/2016 1255   ALT 13 11/21/2024 1023   ALT 22 10/04/2017 0904   ALT 13 07/23/2016 1255   BILITOT 0.6 11/21/2024 1023   BILITOT 1.13 07/23/2016 1255       Impression and Plan: Mr. Carruthers is a very pleasant 79 yo caucasian gentleman with metastatic low-grade neuroendocrine carcinoma with hepatic metastasis and bone metastasis.    Again, it is hard to say exactly what is causing the back pain.  We will see about a dotatate PET scan on him.  I think this would really be helpful.  The fact that the Chromogranin A levels have been improving certainly is encouraging.  Again, we may have to think about radiotherapy to the spine.  He has already had Lutathera  treatments twice.  As such do not think he could have another set of treatments.  We could certainly consider a change in therapy with respect to systemic therapy.  We could consider him for everolimus although this could certainly have toxicity.  We will have him come back in a couple weeks for his Somatuline injection.  I will probably see him back myself in about a month or sooner, depending on what we see with the PET scan.   Maude JONELLE Crease, MD 1/21/202611:44 AM     [1]  Allergies Allergen Reactions   Iohexol  Hives     Code: HIVES, Desc: PER [name] @ [facility], PT IS ALLERGIC TO CONTRAST DYE 10/02/08/RM  05/01/10...needs full premeds per our protocol w/ [facility]., Onset Date: [id number]    Iodinated Contrast  Media Hives   "

## 2024-11-23 LAB — CHROMOGRANIN A: Chromogranin A (ng/mL): 155.2 ng/mL — ABNORMAL HIGH (ref 0.0–101.8)

## 2024-11-26 ENCOUNTER — Encounter: Payer: Self-pay | Admitting: Hematology & Oncology

## 2024-11-26 MED ORDER — CLOPIDOGREL BISULFATE 75 MG PO TABS: 75.0000 mg | ORAL_TABLET | Freq: Every day | ORAL | 0 refills | Status: AC

## 2024-11-26 NOTE — Telephone Encounter (Signed)
 Labs on 11/05/24 outside Normal Range

## 2024-11-28 ENCOUNTER — Other Ambulatory Visit: Payer: Self-pay | Admitting: *Deleted

## 2024-11-28 DIAGNOSIS — C7951 Secondary malignant neoplasm of bone: Secondary | ICD-10-CM

## 2024-11-29 ENCOUNTER — Encounter: Payer: Self-pay | Admitting: Family

## 2024-11-30 ENCOUNTER — Ambulatory Visit: Payer: Self-pay | Admitting: Hematology & Oncology

## 2024-11-30 ENCOUNTER — Encounter (HOSPITAL_COMMUNITY)
Admission: RE | Admit: 2024-11-30 | Discharge: 2024-11-30 | Disposition: A | Source: Ambulatory Visit | Attending: Hematology & Oncology | Admitting: Hematology & Oncology

## 2024-11-30 DIAGNOSIS — C7951 Secondary malignant neoplasm of bone: Secondary | ICD-10-CM | POA: Diagnosis not present

## 2024-11-30 DIAGNOSIS — C787 Secondary malignant neoplasm of liver and intrahepatic bile duct: Secondary | ICD-10-CM | POA: Insufficient documentation

## 2024-11-30 DIAGNOSIS — K769 Liver disease, unspecified: Secondary | ICD-10-CM | POA: Diagnosis present

## 2024-11-30 DIAGNOSIS — C801 Malignant (primary) neoplasm, unspecified: Secondary | ICD-10-CM | POA: Diagnosis not present

## 2024-11-30 DIAGNOSIS — C786 Secondary malignant neoplasm of retroperitoneum and peritoneum: Secondary | ICD-10-CM | POA: Insufficient documentation

## 2024-11-30 MED ORDER — COPPER CU 64 DOTATATE 1 MCI/ML IV SOLN
4.0000 | Freq: Once | INTRAVENOUS | Status: AC
  Administered 2024-11-30: 4.35 via INTRAVENOUS

## 2024-12-03 ENCOUNTER — Inpatient Hospital Stay: Admitting: Family

## 2024-12-03 ENCOUNTER — Inpatient Hospital Stay

## 2024-12-04 ENCOUNTER — Telehealth: Payer: Self-pay | Admitting: Family

## 2024-12-04 NOTE — Telephone Encounter (Signed)
 Called to r/s 2/2 appts that were canceled due to weather/clinic closure. LVM to return call for rescheduling.

## 2024-12-05 ENCOUNTER — Encounter: Payer: Self-pay | Admitting: Neurosurgery

## 2024-12-05 ENCOUNTER — Ambulatory Visit: Admitting: Neurosurgery

## 2024-12-05 ENCOUNTER — Ambulatory Visit

## 2024-12-05 VITALS — BP 116/72 | Ht 70.0 in | Wt 182.0 lb

## 2024-12-05 DIAGNOSIS — S22080D Wedge compression fracture of T11-T12 vertebra, subsequent encounter for fracture with routine healing: Secondary | ICD-10-CM

## 2024-12-05 DIAGNOSIS — S22080A Wedge compression fracture of T11-T12 vertebra, initial encounter for closed fracture: Secondary | ICD-10-CM

## 2024-12-06 ENCOUNTER — Encounter: Payer: Self-pay | Admitting: Neurosurgery

## 2024-12-07 ENCOUNTER — Other Ambulatory Visit: Payer: Self-pay | Admitting: Physician Assistant

## 2024-12-07 ENCOUNTER — Inpatient Hospital Stay

## 2024-12-07 ENCOUNTER — Other Ambulatory Visit: Payer: Self-pay

## 2024-12-07 ENCOUNTER — Inpatient Hospital Stay: Attending: Hematology & Oncology

## 2024-12-07 ENCOUNTER — Inpatient Hospital Stay: Admitting: Family

## 2024-12-07 ENCOUNTER — Encounter: Payer: Self-pay | Admitting: Family

## 2024-12-07 ENCOUNTER — Other Ambulatory Visit: Payer: Self-pay | Admitting: Family

## 2024-12-07 VITALS — BP 145/56 | HR 57 | Temp 98.1°F | Resp 18 | Ht 70.0 in | Wt 187.0 lb

## 2024-12-07 DIAGNOSIS — C7951 Secondary malignant neoplasm of bone: Secondary | ICD-10-CM

## 2024-12-07 DIAGNOSIS — C7B8 Other secondary neuroendocrine tumors: Secondary | ICD-10-CM

## 2024-12-07 DIAGNOSIS — D500 Iron deficiency anemia secondary to blood loss (chronic): Secondary | ICD-10-CM

## 2024-12-07 DIAGNOSIS — K769 Liver disease, unspecified: Secondary | ICD-10-CM

## 2024-12-07 DIAGNOSIS — M47819 Spondylosis without myelopathy or radiculopathy, site unspecified: Secondary | ICD-10-CM

## 2024-12-07 DIAGNOSIS — S22080D Wedge compression fracture of T11-T12 vertebra, subsequent encounter for fracture with routine healing: Secondary | ICD-10-CM

## 2024-12-07 LAB — CBC WITH DIFFERENTIAL (CANCER CENTER ONLY)
Abs Immature Granulocytes: 0.02 10*3/uL (ref 0.00–0.07)
Basophils Absolute: 0 10*3/uL (ref 0.0–0.1)
Basophils Relative: 1 %
Eosinophils Absolute: 0.2 10*3/uL (ref 0.0–0.5)
Eosinophils Relative: 5 %
HCT: 31.2 % — ABNORMAL LOW (ref 39.0–52.0)
Hemoglobin: 10.2 g/dL — ABNORMAL LOW (ref 13.0–17.0)
Immature Granulocytes: 0 %
Lymphocytes Relative: 11 %
Lymphs Abs: 0.5 10*3/uL — ABNORMAL LOW (ref 0.7–4.0)
MCH: 31.8 pg (ref 26.0–34.0)
MCHC: 32.7 g/dL (ref 30.0–36.0)
MCV: 97.2 fL (ref 80.0–100.0)
Monocytes Absolute: 0.4 10*3/uL (ref 0.1–1.0)
Monocytes Relative: 8 %
Neutro Abs: 3.4 10*3/uL (ref 1.7–7.7)
Neutrophils Relative %: 75 %
Platelet Count: 127 10*3/uL — ABNORMAL LOW (ref 150–400)
RBC: 3.21 MIL/uL — ABNORMAL LOW (ref 4.22–5.81)
RDW: 14.2 % (ref 11.5–15.5)
WBC Count: 4.6 10*3/uL (ref 4.0–10.5)
nRBC: 0 % (ref 0.0–0.2)

## 2024-12-07 LAB — RETICULOCYTES
Immature Retic Fract: 16.6 % — ABNORMAL HIGH (ref 2.3–15.9)
RBC.: 3.25 MIL/uL — ABNORMAL LOW (ref 4.22–5.81)
Retic Count, Absolute: 43.6 10*3/uL (ref 19.0–186.0)
Retic Ct Pct: 1.3 % (ref 0.4–3.1)

## 2024-12-07 LAB — CMP (CANCER CENTER ONLY)
ALT: 14 U/L (ref 0–44)
AST: 23 U/L (ref 15–41)
Albumin: 3.8 g/dL (ref 3.5–5.0)
Alkaline Phosphatase: 52 U/L (ref 38–126)
Anion gap: 9 (ref 5–15)
BUN: 23 mg/dL (ref 8–23)
CO2: 25 mmol/L (ref 22–32)
Calcium: 9.2 mg/dL (ref 8.9–10.3)
Chloride: 107 mmol/L (ref 98–111)
Creatinine: 1.16 mg/dL (ref 0.61–1.24)
GFR, Estimated: >60 mL/min
Glucose, Bld: 91 mg/dL (ref 70–99)
Potassium: 4.7 mmol/L (ref 3.5–5.1)
Sodium: 142 mmol/L (ref 135–145)
Total Bilirubin: 0.6 mg/dL (ref 0.0–1.2)
Total Protein: 6.5 g/dL (ref 6.5–8.1)

## 2024-12-07 LAB — IRON AND IRON BINDING CAPACITY (CC-WL,HP ONLY)
Iron: 59 ug/dL (ref 45–182)
Saturation Ratios: 21 % (ref 17.9–39.5)
TIBC: 288 ug/dL (ref 250–450)
UIBC: 229 ug/dL

## 2024-12-07 LAB — FERRITIN: Ferritin: 173 ng/mL (ref 24–336)

## 2024-12-07 MED ORDER — LANREOTIDE ACETATE 120 MG/0.5ML ~~LOC~~ SOLN
120.0000 mg | SUBCUTANEOUS | Status: DC
  Administered 2024-12-07: 120 mg via SUBCUTANEOUS
  Filled 2024-12-07: qty 0.5

## 2024-12-07 NOTE — Progress Notes (Signed)
 " Hematology and Oncology Follow Up Visit  Brian Mays [id number] 1945/11/04 79 y.o. 12/07/2024   Principle Diagnosis:  Metastatic low grade neuroendocrine tumor-hepatic metastases Iron  deficiency anemia Erythropoietin  deficient anemia   Past Diagnosis:  S/p yttrium-90 intrahepatic therapy - November 2016 Lutathera  (Lu 177) injection on 02/01/2018 - s/p cycle 4 SBRT to T6 lesion -- 11/06/2021   Current Therapy:        Somatuline 120 mg q 4 week-start on 08/01/2023  IV Iron  as indicated   Lutathera -retreatment -09/06/2023 -- s/p 2/2 cycles Aranesp 300 mcg SQ q. 3 weeks for hemoglobin less than 10   Interim History:  Brian Mays is here today for follow-up and treatment. He is doing well but did have a fall on the ice in his driveway last week. Thankfully he states that he was not injured and did not hit his head.  No other falls and no syncope reported.  He states that sleeping in a recliner has helped reduce the pain in his back from the compression fracture at T12. He is waiting to get an appointment with Dr. Beuford for a 2nd opinion on treatment. He states that neuro surgery did not feel an injection would help.  No fever, chills, n/v, cough, rash, dizziness, SOB, chest pain, palpitations, abdominal pain or changes in bowel or bladder habits.  No swelling, tenderness, numbness or tingling in his extremities at this time.  Appetite and hydration are good. Weight is stable at 187 lbs.   ECOG Performance Status: 1 - Symptomatic but completely ambulatory  Medications:  Allergies as of 12/07/2024       Reactions   Iohexol  Hives    Code: HIVES, Desc: PER [name] @ [facility], PT IS ALLERGIC TO CONTRAST DYE 10/02/08/RM  05/01/10...needs full premeds per our protocol w/ [facility]., Onset Date: [id number]   Iodinated Contrast Media Hives        Medication List        Accurate as of December 07, 2024 10:44 AM. If you have any questions, ask your nurse or doctor.          amLODipine   5 MG tablet Commonly known as: NORVASC  Take 1 tablet (5 mg total) by mouth daily.   aspirin  EC 81 MG tablet Take 81 mg by mouth every morning.   atorvastatin  80 MG tablet Commonly known as: LIPITOR TAKE 1 TABLET BY MOUTH EVERY DAY   Cholecalciferol  25 MCG (1000 UT) tablet Take 1,000 Units by mouth 2 (two) times daily.   clopidogrel  75 MG tablet Commonly known as: PLAVIX  Take 1 tablet (75 mg total) by mouth daily.   cyanocobalamin  1000 MCG tablet Commonly known as: VITAMIN B12 Take 1,000 mcg by mouth every other day.   diphenhydramine -acetaminophen  25-500 MG Tabs tablet Commonly known as: TYLENOL  PM Take 1 tablet by mouth at bedtime as needed (sleep/pain).   fluticasone  50 MCG/ACT nasal spray Commonly known as: FLONASE  Place 2 sprays into both nostrils daily as needed (sinuses).   hydrocortisone  25 MG suppository Commonly known as: ANUSOL -HC INSERT 1 SUPPOSITORY RECTALLY TWICE A DAY AS NEEDED FOR HEMORRHOIDS   ipratropium 0.03 % nasal spray Commonly known as: ATROVENT  Place 2 sprays into the nose 3 (three) times daily as needed for rhinitis.   lansoprazole  30 MG capsule Commonly known as: PREVACID  TAKE 1 CAPSULE (30 MG TOTAL) BY MOUTH DAILY AT 12 NOON.   levothyroxine  50 MCG tablet Commonly known as: SYNTHROID  TAKE 1 TABLET BY MOUTH EVERY DAY BEFORE BREAKFAST   LORazepam   0.5 MG tablet Commonly known as: Ativan  1 tab po 30 minutes prior to radiation or MRI scans   losartan  100 MG tablet Commonly known as: COZAAR  Take 1 tablet (100 mg total) by mouth daily.   metoprolol  tartrate 25 MG tablet Commonly known as: LOPRESSOR  TAKE 1/2 TABLET TWICE A DAY BY MOUTH   nitroGLYCERIN  0.4 MG SL tablet Commonly known as: NITROSTAT  PLACE 1 TABLET UNDER THE TONGUE EVERY 5 (FIVE) MINUTES X 3 DOSES AS NEEDED FOR CHEST PAIN.   ranolazine  500 MG 12 hr tablet Commonly known as: RANEXA  TAKE 1 TABLET BY MOUTH TWICE DAILY . APPOINTMENT REQUIRED FOR FUTURE REFILLS   SOMATULINE  DEPOT Oasis Inject 120 mcg into the skin every 28 (twenty-eight) days. Receives at Dr Jessy office   SOOTHE XP OP Place 1 drop into both eyes daily as needed (dry eyes).        Allergies: Allergies[1]  Past Medical History, Surgical history, Social history, and Family History were reviewed and updated.  Review of Systems: All other 10 point review of systems is negative.   Physical Exam:  vitals were not taken for this visit.   Wt Readings from Last 3 Encounters:  12/05/24 182 lb (82.6 kg)  11/21/24 186 lb 6.4 oz (84.6 kg)  11/05/24 184 lb (83.5 kg)    Ocular: Sclerae unicteric, pupils equal, round and reactive to light Ear-nose-throat: Oropharynx clear, dentition fair Lymphatic: No cervical or supraclavicular adenopathy Lungs no rales or rhonchi, good excursion bilaterally Heart regular rate and rhythm, no murmur appreciated Abd soft, nontender, positive bowel sounds MSK no focal spinal tenderness, no joint edema Neuro: non-focal, well-oriented, appropriate affect Breasts: Deferred   Lab Results  Component Value Date   WBC 8.3 11/21/2024   HGB 11.1 (L) 11/21/2024   HCT 34.2 (L) 11/21/2024   MCV 97.4 11/21/2024   PLT 179 11/21/2024   Lab Results  Component Value Date   FERRITIN 180 11/21/2024   IRON  79 11/21/2024   TIBC 308 11/21/2024   UIBC 229 11/21/2024   IRONPCTSAT 26 11/21/2024   Lab Results  Component Value Date   RETICCTPCT 1.5 11/21/2024   RBC 3.51 (L) 11/21/2024   RBC 3.50 (L) 11/21/2024   No results found for: KPAFRELGTCHN, LAMBDASER, KAPLAMBRATIO No results found for: KIMBERLY LE, IGMSERUM No results found for: STEPHANY CARLOTA BENSON MARKEL EARLA JOANNIE DOC VICK, SPEI   Chemistry      Component Value Date/Time   NA 140 11/21/2024 1023   NA 141 06/22/2022 0942   NA 144 10/04/2017 0904   NA 141 07/23/2016 1255   K 4.6 11/21/2024 1023   K 3.8 10/04/2017 0904   K 4.1 07/23/2016 1255   CL 103  11/21/2024 1023   CL 103 10/04/2017 0904   CO2 28 11/21/2024 1023   CO2 28 10/04/2017 0904   CO2 27 07/23/2016 1255   BUN 26 (H) 11/21/2024 1023   BUN 20 06/22/2022 0942   BUN 14 10/04/2017 0904   BUN 13.8 07/23/2016 1255   CREATININE 1.21 11/21/2024 1023   CREATININE 0.8 10/04/2017 0904   CREATININE 1.0 07/23/2016 1255      Component Value Date/Time   CALCIUM  9.3 11/21/2024 1023   CALCIUM  9.4 10/04/2017 0904   CALCIUM  9.2 07/23/2016 1255   ALKPHOS 60 11/21/2024 1023   ALKPHOS 64 10/04/2017 0904   ALKPHOS 77 07/23/2016 1255   AST 19 11/21/2024 1023   AST 23 07/23/2016 1255   ALT 13 11/21/2024 1023   ALT 22  10/04/2017 0904   ALT 13 07/23/2016 1255   BILITOT 0.6 11/21/2024 1023   BILITOT 1.13 07/23/2016 1255       Impression and Plan: Mr. Parker is a very pleasant 79 yo caucasian gentleman with metastatic low-grade neuroendocrine carcinoma with hepatic metastasis and bone metastasis.   Thankfully his recent Dotatate PET scan showed stable disease.  We will proceed with Somatuline today as planned. No ESA needed, Hg 10.2.  Chromogranin A pending. Last month his level was 155. Iron  studies also pending. We can replace if needed.  Follow-up in 1 month.   Lauraine Pepper, NP 2/6/202610:44 AM     [1]  Allergies Allergen Reactions   Iohexol  Hives     Code: HIVES, Desc: PER [name] @ [facility], PT IS ALLERGIC TO CONTRAST DYE 10/02/08/RM  05/01/10...needs full premeds per our protocol w/ [facility]., Onset Date: [id number]    Iodinated Contrast Media Hives   "

## 2024-12-07 NOTE — Patient Instructions (Signed)
 Lanreotide Injection What is this medication? LANREOTIDE (lan REE oh tide) treats high levels of growth hormone (acromegaly). It is used when other therapies have not worked well enough or cannot be tolerated. It works by reducing the amount of growth hormone your body makes. This reduces symptoms and the risk of health problems caused by too much growth hormone, such as diabetes and heart disease. It may also be used to treat neuroendocrine tumors, a cancer of the cells that release hormones and other substances in your body. It works by slowing down the release of these substances from the cells. This slows tumor growth. It also decreases the symptoms of carcinoid syndrome, such as flushing or diarrhea. This medicine may be used for other purposes; ask your health care provider or pharmacist if you have questions. COMMON BRAND NAME(S): Somatuline [place] What should I tell my care team before I take this medication? They need to know if you have any of these conditions: Diabetes Gallbladder disease Heart disease Kidney disease Liver disease Pancreatic disease Thyroid disease An unusual or allergic reaction to lanreotide, other medications, foods, dyes, or preservatives Pregnant or trying to get pregnant Breastfeeding How should I use this medication? This medication is injected under the skin. It is given by your care team in a hospital or clinic setting. Talk to your care team about the use of this medication in children. Special care may be needed. Overdosage: If you think you have taken too much of this medicine contact a poison control center or emergency room at once. NOTE: This medicine is only for you. Do not share this medicine with others. What if I miss a dose? Keep appointments for follow-up doses. It is important not to miss your dose. Call your care team if you are unable to keep an appointment. What may interact with this medication? Bromocriptine Cyclosporine Certain  medications for blood pressure, heart disease, irregular heartbeat Certain medications for diabetes Quinidine Terfenadine This list may not describe all possible interactions. Give your health care provider a list of all the medicines, herbs, non-prescription drugs, or dietary supplements you use. Also tell them if you smoke, drink alcohol, or use illegal drugs. Some items may interact with your medicine. What should I watch for while using this medication? Visit your care team for regular checks on your progress. Tell your care team if your symptoms do not start to get better or if they get worse. Your condition will be monitored carefully while you are receiving this medication. You may need blood work while you are taking this medication. This medication may increase blood sugar. The risk may be higher in patients who already have diabetes. Ask your care team what you can do to lower your risk of diabetes while taking this medication. Talk to your care team if you wish to become pregnant or think you may be pregnant. This medication can cause serious birth defects. Do not breast-feed while taking this medication and for 6 months after stopping therapy. This medication may cause infertility. Talk to your care team if you are concerned about your fertility. What side effects may I notice from receiving this medication? Side effects that you should report to your care team as soon as possible: Allergic reactions--skin rash, itching, hives, swelling of the face, lips, tongue, or throat Gallbladder problems--severe stomach pain, nausea, vomiting, fever High blood sugar (hyperglycemia)--increased thirst or amount of urine, unusual weakness or fatigue, blurry vision Increase in blood pressure Low blood sugar (hypoglycemia)--pale, blue or purple  skin or lips, sweating, fussiness, rapid heartbeat, poor feeding, low body temperature Low thyroid levels (hypothyroidism)--unusual weakness or fatigue,  increased sensitivity to cold, constipation, hair loss, dry skin, weight gain, feelings of depression Oily or light-colored stools, diarrhea, bloating, weight loss Slow heartbeat--dizziness, feeling faint or lightheaded, confusion, trouble breathing, unusual weakness or fatigue Side effects that usually do not require medical attention (report these to your care team if they continue or are bothersome): Diarrhea Dizziness Headache Muscle spasms Nausea Pain, redness, or irritation at injection site Stomach pain This list may not describe all possible side effects. Call your doctor for medical advice about side effects. You may report side effects to FDA at 1-800-FDA-1088. Where should I keep my medication? This medication is given in a hospital or clinic. It will not be stored at home. NOTE: This sheet is a summary. It may not cover all possible information. If you have questions about this medicine, talk to your doctor, pharmacist, or health care provider.  2024 Elsevier/Gold Standard (2023-09-30 00:00:00)

## 2024-12-19 ENCOUNTER — Inpatient Hospital Stay

## 2024-12-19 ENCOUNTER — Inpatient Hospital Stay: Admitting: Family

## 2024-12-19 ENCOUNTER — Inpatient Hospital Stay: Attending: Hematology & Oncology

## 2025-06-25 ENCOUNTER — Ambulatory Visit
